# Patient Record
Sex: Female | Born: 1955 | Race: Black or African American | Hispanic: No | Smoking: Former smoker
Health system: Southern US, Community
[De-identification: ages and names within clinical notes are randomized; demographics above are authoritative.]

## PROBLEM LIST (undated history)

## (undated) DIAGNOSIS — E785 Hyperlipidemia, unspecified: Secondary | ICD-10-CM

## (undated) DIAGNOSIS — I10 Essential (primary) hypertension: Secondary | ICD-10-CM

## (undated) DIAGNOSIS — J45909 Unspecified asthma, uncomplicated: Secondary | ICD-10-CM

## (undated) DIAGNOSIS — I509 Heart failure, unspecified: Secondary | ICD-10-CM

## (undated) DIAGNOSIS — Z992 Dependence on renal dialysis: Secondary | ICD-10-CM

## (undated) DIAGNOSIS — N189 Chronic kidney disease, unspecified: Secondary | ICD-10-CM

## (undated) DIAGNOSIS — D649 Anemia, unspecified: Secondary | ICD-10-CM

## (undated) DIAGNOSIS — I96 Gangrene, not elsewhere classified: Secondary | ICD-10-CM

## (undated) DIAGNOSIS — G709 Myoneural disorder, unspecified: Secondary | ICD-10-CM

## (undated) DIAGNOSIS — E118 Type 2 diabetes mellitus with unspecified complications: Secondary | ICD-10-CM

## (undated) DIAGNOSIS — N186 End stage renal disease: Secondary | ICD-10-CM

## (undated) DIAGNOSIS — M1990 Unspecified osteoarthritis, unspecified site: Secondary | ICD-10-CM

## (undated) HISTORY — DX: Gangrene, not elsewhere classified: I96

## (undated) HISTORY — DX: Heart failure, unspecified: I50.9

## (undated) HISTORY — DX: Essential (primary) hypertension: I10

## (undated) HISTORY — PX: INSERTION OF DIALYSIS CATHETER: SHX1324

## (undated) HISTORY — DX: End stage renal disease: Z99.2

## (undated) HISTORY — DX: End stage renal disease: N18.6

## (undated) HISTORY — PX: TUBAL LIGATION: SHX77

## (undated) HISTORY — DX: Anemia, unspecified: D64.9

## (undated) HISTORY — DX: Type 2 diabetes mellitus with unspecified complications: E11.8

---

## 2014-06-22 ENCOUNTER — Encounter: Payer: Self-pay | Admitting: Vascular Surgery

## 2014-06-22 ENCOUNTER — Other Ambulatory Visit: Payer: Self-pay | Admitting: *Deleted

## 2014-06-22 DIAGNOSIS — Z01810 Encounter for preprocedural cardiovascular examination: Secondary | ICD-10-CM

## 2014-06-22 DIAGNOSIS — N179 Acute kidney failure, unspecified: Secondary | ICD-10-CM

## 2014-06-24 DIAGNOSIS — E669 Obesity, unspecified: Secondary | ICD-10-CM | POA: Insufficient documentation

## 2014-06-24 DIAGNOSIS — I10 Essential (primary) hypertension: Secondary | ICD-10-CM | POA: Insufficient documentation

## 2014-06-24 DIAGNOSIS — I509 Heart failure, unspecified: Secondary | ICD-10-CM | POA: Insufficient documentation

## 2014-06-28 ENCOUNTER — Ambulatory Visit: Payer: Self-pay | Admitting: Vascular Surgery

## 2014-06-28 ENCOUNTER — Other Ambulatory Visit (HOSPITAL_COMMUNITY): Payer: Self-pay

## 2014-06-28 ENCOUNTER — Encounter (HOSPITAL_COMMUNITY): Payer: Self-pay

## 2014-07-04 ENCOUNTER — Encounter: Payer: Self-pay | Admitting: Vascular Surgery

## 2014-07-05 ENCOUNTER — Other Ambulatory Visit (HOSPITAL_COMMUNITY): Payer: Self-pay

## 2014-07-05 ENCOUNTER — Ambulatory Visit: Payer: Self-pay | Admitting: Vascular Surgery

## 2014-07-05 ENCOUNTER — Inpatient Hospital Stay (HOSPITAL_COMMUNITY): Admission: RE | Admit: 2014-07-05 | Payer: Self-pay | Source: Ambulatory Visit

## 2014-07-20 ENCOUNTER — Encounter: Payer: Self-pay | Admitting: Vascular Surgery

## 2014-07-21 ENCOUNTER — Ambulatory Visit: Payer: Self-pay | Admitting: Vascular Surgery

## 2014-07-21 ENCOUNTER — Encounter (HOSPITAL_COMMUNITY): Payer: Self-pay

## 2014-07-21 ENCOUNTER — Other Ambulatory Visit (HOSPITAL_COMMUNITY): Payer: Self-pay

## 2014-07-25 ENCOUNTER — Encounter: Payer: Self-pay | Admitting: Vascular Surgery

## 2014-07-26 ENCOUNTER — Ambulatory Visit (INDEPENDENT_AMBULATORY_CARE_PROVIDER_SITE_OTHER): Payer: BLUE CROSS/BLUE SHIELD | Admitting: Vascular Surgery

## 2014-07-26 ENCOUNTER — Ambulatory Visit (HOSPITAL_COMMUNITY)
Admission: RE | Admit: 2014-07-26 | Discharge: 2014-07-26 | Disposition: A | Discharge status: Home / Self Care | Payer: BLUE CROSS/BLUE SHIELD | Source: Ambulatory Visit | Attending: Vascular Surgery | Admitting: Vascular Surgery

## 2014-07-26 ENCOUNTER — Encounter: Payer: Self-pay | Admitting: Vascular Surgery

## 2014-07-26 ENCOUNTER — Ambulatory Visit (INDEPENDENT_AMBULATORY_CARE_PROVIDER_SITE_OTHER)
Admission: RE | Admit: 2014-07-26 | Discharge: 2014-07-26 | Disposition: A | Discharge status: Home / Self Care | Payer: BLUE CROSS/BLUE SHIELD | Source: Ambulatory Visit | Attending: Vascular Surgery | Admitting: Vascular Surgery

## 2014-07-26 ENCOUNTER — Other Ambulatory Visit: Payer: Self-pay

## 2014-07-26 VITALS — BP 175/82 | HR 87 | Ht 64.0 in | Wt 241.0 lb

## 2014-07-26 DIAGNOSIS — Z01810 Encounter for preprocedural cardiovascular examination: Secondary | ICD-10-CM

## 2014-07-26 DIAGNOSIS — N179 Acute kidney failure, unspecified: Secondary | ICD-10-CM

## 2014-07-26 DIAGNOSIS — N186 End stage renal disease: Secondary | ICD-10-CM | POA: Insufficient documentation

## 2014-07-26 NOTE — Progress Notes (Signed)
Subjective:     Patient ID: Christy Zhang, female   DOB: [birth year], 59 y.o.   MRN: [id number]  HPI this 59 year old female is evaluated for vascular access. She is right handed. She has been on dialysis since February 2016. She currently has a tunneled hemodialysis catheter in the right IJ. She has never had an attempt of vascular access in the past.  Past Medical History  Diagnosis Date  . Hypertension   . Diabetes mellitus without complication   . Anemia   . CHF (congestive heart failure)     History  Substance Use Topics  . Smoking status: Former Smoker    Types: Cigarettes    Quit date: 07/25/1984  . Smokeless tobacco: Not on file  . Alcohol Use: No    Family History  Problem Relation Age of Onset  . Diabetes Mother   . Hypertension Mother   . Cancer Father   . Diabetes Father   . Hypertension Sister   . Diabetes Daughter   . Heart disease Daughter   . Hypertension Daughter   . Diabetes Son   . Heart disease Son   . Hypertension Son   . Peripheral vascular disease Son     amputation    No Known Allergies   Current outpatient prescriptions:  .  amLODipine (NORVASC) 10 MG tablet, Take 10 mg by mouth daily., Disp: , Rfl:  .  ergocalciferol (VITAMIN D2) 50000 UNITS capsule, Take 50,000 Units by mouth once a week., Disp: , Rfl:  .  insulin lispro protamine-lispro (HUMALOG 75/25 MIX) (75-25) 100 UNIT/ML SUSP injection, Inject into the skin., Disp: , Rfl:  .  metoprolol succinate (TOPROL-XL) 100 MG 24 hr tablet, Take 100 mg by mouth daily. Take with or immediately following a meal., Disp: , Rfl:   Filed Vitals:   07/26/14 1439 07/26/14 1446  BP: 172/80 175/82  Pulse: 87   Height: 5\' 4"  (1.626 m)   Weight: 241 lb (109.317 kg)   SpO2: 96%     Body mass index is 41.35 kg/(m^2).           Review of Systems denies chest pain   or significant dyspnea on exertion. Had significant lower extremity edema prior to beginning dialysis but that is much better.  Area of hypertension and diabetes mellitus type 1 for 20 years Objective:   Physical Exam BP 175/82 mmHg  Pulse 87  Ht 5\' 4"  (1.626 m)  Wt 241 lb (109.317 kg)  BMI 41.35 kg/m2  SpO2 96%  Gen.-alert and oriented x3 in no apparent distress-obese HEENT normal for age Lungs no rhonchi or wheezing Cardiovascular regular rhythm no murmurs carotid pulses 3+ palpable no bruits audible-tunneled dialysis catheter right upper chest area Abdomen soft nontender no palpable masses Musculoskeletal free of  major deformities Skin clear -no rashes Neurologic normal Lower extremities 3+ femoral and dorsalis pedis pulses palpable bilaterally with 1+ edema       Assessment:     Today I ordered upper extremity vein mapping and arterial study. The cephalic vein in the right upper arm is the best vein available from a size standpoint. Forearm veins are inadequate bilaterally. Left upper arm cephalic vein is marginal. Radial and ulnar arterial flow bilaterally is good    Plan:     End-stage renal disease needs vascular access Plan creation right brachial-cephalic AV fistula on Thursday, April 7 as outpatient. Patient will check to see if she can get transportation for this procedure.

## 2014-08-10 ENCOUNTER — Encounter (HOSPITAL_COMMUNITY): Payer: Self-pay | Admitting: Anesthesiology

## 2014-08-10 MED ORDER — SODIUM CHLORIDE 0.9 % IV SOLN: INTRAVENOUS | Status: DC

## 2014-08-10 MED ORDER — CHLORHEXIDINE GLUCONATE CLOTH 2 % EX PADS: 6.0000 | MEDICATED_PAD | Freq: Once | CUTANEOUS | Status: DC

## 2014-08-10 MED ORDER — CEFUROXIME SODIUM 1.5 G IJ SOLR: 1.5000 g | INTRAMUSCULAR | Status: DC

## 2014-08-10 NOTE — Progress Notes (Signed)
Pt denies SOB, chest pain, and being under the care of a cardiologist. Pt made aware to not take diabetic medications the morning of procedure. Pt made aware to stop taking  taking  otc vitamins and herbal medications. Do not take any NSAIDs ie: Ibuprofen, Advil, Naproxen and etc.Pt verbalized understanding of all pre-op instructions.

## 2014-08-10 NOTE — Anesthesia Preprocedure Evaluation (Deleted)
Anesthesia Evaluation  Patient identified by MRN, date of birth, ID band Patient awake    Reviewed: Allergy & Precautions, NPO status , Patient's Chart, lab work & pertinent test results, reviewed documented beta blocker date and time   Airway Mallampati: II  TM Distance: >3 FB Neck ROM: Full    Dental no notable dental hx.    Pulmonary former smoker,  breath sounds clear to auscultation  Pulmonary exam normal       Cardiovascular hypertension, Pt. on medications and Pt. on home beta blockers +CHF Rhythm:Regular Rate:Normal     Neuro/Psych negative neurological ROS  negative psych ROS   GI/Hepatic negative GI ROS, Neg liver ROS,   Endo/Other  negative endocrine ROSdiabetes, Type 2, Oral Hypoglycemic AgentsMorbid obesity  Renal/GU ESRFRenal disease  negative genitourinary   Musculoskeletal negative musculoskeletal ROS (+)   Abdominal   Peds negative pediatric ROS (+)  Hematology  (+) anemia ,   Anesthesia Other Findings   Reproductive/Obstetrics negative OB ROS                             Anesthesia Physical Anesthesia Plan  ASA: III  Anesthesia Plan: MAC   Post-op Pain Management:    Induction: Intravenous  Airway Management Planned: Simple Face Mask, Natural Airway and Nasal Cannula  Additional Equipment:   Intra-op Plan:   Post-operative Plan:   Informed Consent: I have reviewed the patients History and Physical, chart, labs and discussed the procedure including the risks, benefits and alternatives for the proposed anesthesia with the patient or authorized representative who has indicated his/her understanding and acceptance.   Dental advisory given  Plan Discussed with: CRNA  Anesthesia Plan Comments:         Anesthesia Quick Evaluation

## 2014-08-11 ENCOUNTER — Other Ambulatory Visit: Payer: Self-pay

## 2014-08-11 ENCOUNTER — Ambulatory Visit (HOSPITAL_COMMUNITY)
Admission: RE | Admit: 2014-08-11 | Payer: BLUE CROSS/BLUE SHIELD | Source: Ambulatory Visit | Admitting: Vascular Surgery

## 2014-08-11 ENCOUNTER — Encounter (HOSPITAL_COMMUNITY): Admission: RE | Payer: Self-pay | Source: Ambulatory Visit

## 2014-08-11 SURGERY — ARTERIOVENOUS (AV) FISTULA CREATION
Anesthesia: Monitor Anesthesia Care | Laterality: Right

## 2014-08-24 ENCOUNTER — Encounter (HOSPITAL_COMMUNITY): Payer: Self-pay | Admitting: *Deleted

## 2014-08-25 ENCOUNTER — Telehealth: Payer: Self-pay | Admitting: Vascular Surgery

## 2014-08-25 ENCOUNTER — Encounter (HOSPITAL_COMMUNITY): Payer: Self-pay | Admitting: Anesthesiology

## 2014-08-25 ENCOUNTER — Encounter (HOSPITAL_COMMUNITY)
Admission: RE | Disposition: A | Discharge status: Home / Self Care | Payer: Self-pay | Source: Ambulatory Visit | Attending: Vascular Surgery

## 2014-08-25 ENCOUNTER — Other Ambulatory Visit: Payer: Self-pay | Admitting: *Deleted

## 2014-08-25 ENCOUNTER — Ambulatory Visit (HOSPITAL_COMMUNITY): Payer: Medicaid Other

## 2014-08-25 ENCOUNTER — Ambulatory Visit (HOSPITAL_COMMUNITY): Payer: Medicaid Other | Admitting: Anesthesiology

## 2014-08-25 ENCOUNTER — Ambulatory Visit (HOSPITAL_COMMUNITY)
Admission: RE | Admit: 2014-08-25 | Discharge: 2014-08-25 | Disposition: A | Discharge status: Home / Self Care | Payer: Medicaid Other | Source: Ambulatory Visit | Attending: Vascular Surgery | Admitting: Vascular Surgery

## 2014-08-25 DIAGNOSIS — N186 End stage renal disease: Secondary | ICD-10-CM | POA: Diagnosis not present

## 2014-08-25 DIAGNOSIS — Z6841 Body Mass Index (BMI) 40.0 and over, adult: Secondary | ICD-10-CM | POA: Diagnosis not present

## 2014-08-25 DIAGNOSIS — J45909 Unspecified asthma, uncomplicated: Secondary | ICD-10-CM | POA: Diagnosis not present

## 2014-08-25 DIAGNOSIS — E119 Type 2 diabetes mellitus without complications: Secondary | ICD-10-CM | POA: Insufficient documentation

## 2014-08-25 DIAGNOSIS — Z87891 Personal history of nicotine dependence: Secondary | ICD-10-CM | POA: Diagnosis not present

## 2014-08-25 DIAGNOSIS — Z4931 Encounter for adequacy testing for hemodialysis: Secondary | ICD-10-CM

## 2014-08-25 DIAGNOSIS — E669 Obesity, unspecified: Secondary | ICD-10-CM | POA: Diagnosis not present

## 2014-08-25 DIAGNOSIS — I509 Heart failure, unspecified: Secondary | ICD-10-CM | POA: Insufficient documentation

## 2014-08-25 DIAGNOSIS — Z01811 Encounter for preprocedural respiratory examination: Secondary | ICD-10-CM

## 2014-08-25 DIAGNOSIS — I120 Hypertensive chronic kidney disease with stage 5 chronic kidney disease or end stage renal disease: Secondary | ICD-10-CM | POA: Diagnosis present

## 2014-08-25 HISTORY — DX: Unspecified osteoarthritis, unspecified site: M19.90

## 2014-08-25 HISTORY — PX: AV FISTULA PLACEMENT: SHX1204

## 2014-08-25 HISTORY — DX: Chronic kidney disease, unspecified: N18.9

## 2014-08-25 HISTORY — DX: Myoneural disorder, unspecified: G70.9

## 2014-08-25 HISTORY — DX: Unspecified asthma, uncomplicated: J45.909

## 2014-08-25 LAB — POCT I-STAT 4, (NA,K, GLUC, HGB,HCT)
Glucose, Bld: 280 mg/dL — ABNORMAL HIGH (ref 70–99)
HCT: 45 % (ref 36.0–46.0)
Hemoglobin: 15.3 g/dL — ABNORMAL HIGH (ref 12.0–15.0)
Potassium: 4.4 mmol/L (ref 3.5–5.1)
Sodium: 135 mmol/L (ref 135–145)

## 2014-08-25 LAB — GLUCOSE, CAPILLARY
Glucose-Capillary: 243 mg/dL — ABNORMAL HIGH (ref 70–99)
Glucose-Capillary: 268 mg/dL — ABNORMAL HIGH (ref 70–99)

## 2014-08-25 SURGERY — ARTERIOVENOUS (AV) FISTULA CREATION
Anesthesia: Monitor Anesthesia Care | Site: Arm Upper | Laterality: Right

## 2014-08-25 MED ORDER — FENTANYL CITRATE (PF) 250 MCG/5ML IJ SOLN
INTRAMUSCULAR | Status: AC
  Filled 2014-08-25: qty 5

## 2014-08-25 MED ORDER — SODIUM CHLORIDE 0.9 % IJ SOLN
INTRAMUSCULAR | Status: AC
  Filled 2014-08-25: qty 10

## 2014-08-25 MED ORDER — CEFAZOLIN SODIUM-DEXTROSE 2-3 GM-% IV SOLR
INTRAVENOUS | Status: DC | PRN
Start: 2014-08-25 — End: 2014-08-25
  Administered 2014-08-25: 2 g via INTRAVENOUS

## 2014-08-25 MED ORDER — NEOSTIGMINE METHYLSULFATE 10 MG/10ML IV SOLN
INTRAVENOUS | Status: AC
  Filled 2014-08-25: qty 1

## 2014-08-25 MED ORDER — PROPOFOL INFUSION 10 MG/ML OPTIME
INTRAVENOUS | Status: DC | PRN
Start: 2014-08-25 — End: 2014-08-25
  Administered 2014-08-25: 75 ug/kg/min via INTRAVENOUS

## 2014-08-25 MED ORDER — FENTANYL CITRATE (PF) 100 MCG/2ML IJ SOLN
INTRAMUSCULAR | Status: DC | PRN
  Administered 2014-08-25: 25 ug via INTRAVENOUS
  Administered 2014-08-25: 50 ug via INTRAVENOUS
  Administered 2014-08-25: 25 ug via INTRAVENOUS

## 2014-08-25 MED ORDER — ARTIFICIAL TEARS OP OINT
TOPICAL_OINTMENT | OPHTHALMIC | Status: AC
  Filled 2014-08-25: qty 3.5

## 2014-08-25 MED ORDER — PROPOFOL 10 MG/ML IV BOLUS
INTRAVENOUS | Status: AC
  Filled 2014-08-25: qty 20

## 2014-08-25 MED ORDER — HYDROMORPHONE HCL 1 MG/ML IJ SOLN: 0.2500 mg | INTRAMUSCULAR | Status: DC | PRN

## 2014-08-25 MED ORDER — LIDOCAINE-EPINEPHRINE (PF) 1 %-1:200000 IJ SOLN
INTRAMUSCULAR | Status: DC | PRN
  Administered 2014-08-25: 16 mL

## 2014-08-25 MED ORDER — SODIUM CHLORIDE 0.9 % IV SOLN
INTRAVENOUS | Status: DC | PRN
  Administered 2014-08-25: 07:00:00 via INTRAVENOUS

## 2014-08-25 MED ORDER — OXYCODONE-ACETAMINOPHEN 5-325 MG PO TABS: 1.0000 | ORAL_TABLET | Freq: Four times a day (QID) | ORAL | Status: DC | PRN

## 2014-08-25 MED ORDER — PHENYLEPHRINE 40 MCG/ML (10ML) SYRINGE FOR IV PUSH (FOR BLOOD PRESSURE SUPPORT)
PREFILLED_SYRINGE | INTRAVENOUS | Status: AC
  Filled 2014-08-25: qty 10

## 2014-08-25 MED ORDER — 0.9 % SODIUM CHLORIDE (POUR BTL) OPTIME
TOPICAL | Status: DC | PRN
  Administered 2014-08-25: 1000 mL

## 2014-08-25 MED ORDER — LIDOCAINE-EPINEPHRINE (PF) 1 %-1:200000 IJ SOLN
INTRAMUSCULAR | Status: AC
  Filled 2014-08-25: qty 10

## 2014-08-25 MED ORDER — EPHEDRINE SULFATE 50 MG/ML IJ SOLN
INTRAMUSCULAR | Status: AC
  Filled 2014-08-25: qty 1

## 2014-08-25 MED ORDER — ONDANSETRON HCL 4 MG/2ML IJ SOLN
INTRAMUSCULAR | Status: AC
  Filled 2014-08-25: qty 2

## 2014-08-25 MED ORDER — ROCURONIUM BROMIDE 50 MG/5ML IV SOLN
INTRAVENOUS | Status: AC
  Filled 2014-08-25: qty 1

## 2014-08-25 MED ORDER — SUCCINYLCHOLINE CHLORIDE 20 MG/ML IJ SOLN
INTRAMUSCULAR | Status: AC
  Filled 2014-08-25: qty 1

## 2014-08-25 MED ORDER — LIDOCAINE HCL (CARDIAC) 20 MG/ML IV SOLN
INTRAVENOUS | Status: AC
Start: 2014-08-25 — End: 2014-08-25
  Filled 2014-08-25: qty 5

## 2014-08-25 MED ORDER — SODIUM CHLORIDE 0.9 % IR SOLN
Status: DC | PRN
  Administered 2014-08-25: 500 mL

## 2014-08-25 MED ORDER — MIDAZOLAM HCL 2 MG/2ML IJ SOLN
INTRAMUSCULAR | Status: AC
  Filled 2014-08-25: qty 2

## 2014-08-25 MED ORDER — CEFAZOLIN SODIUM-DEXTROSE 2-3 GM-% IV SOLR
INTRAVENOUS | Status: AC
  Filled 2014-08-25: qty 50

## 2014-08-25 MED ORDER — MIDAZOLAM HCL 5 MG/5ML IJ SOLN
INTRAMUSCULAR | Status: DC | PRN
  Administered 2014-08-25 (×2): 1 mg via INTRAVENOUS

## 2014-08-25 SURGICAL SUPPLY — 32 items
ARMBAND PINK RESTRICT EXTREMIT (MISCELLANEOUS) ×2 IMPLANT
CANISTER SUCTION 2500CC (MISCELLANEOUS) ×2 IMPLANT
CLIP TI MEDIUM 6 (CLIP) ×2 IMPLANT
CLIP TI WIDE RED SMALL 6 (CLIP) ×4 IMPLANT
COVER PROBE W GEL 5X96 (DRAPES) ×2 IMPLANT
DRAIN PENROSE 1/4X12 LTX STRL (WOUND CARE) ×2 IMPLANT
ELECT REM PT RETURN 9FT ADLT (ELECTROSURGICAL) ×2
ELECTRODE REM PT RTRN 9FT ADLT (ELECTROSURGICAL) ×1 IMPLANT
GEL ULTRASOUND 20GR AQUASONIC (MISCELLANEOUS) IMPLANT
GLOVE BIO SURGEON STRL SZ7 (GLOVE) ×2 IMPLANT
GLOVE BIO SURGEON STRL SZ7.5 (GLOVE) ×2 IMPLANT
GLOVE BIOGEL PI IND STRL 7.5 (GLOVE) ×2 IMPLANT
GLOVE BIOGEL PI INDICATOR 7.5 (GLOVE) ×2
GLOVE ECLIPSE 7.0 STRL STRAW (GLOVE) ×2 IMPLANT
GLOVE SS BIOGEL STRL SZ 7 (GLOVE) ×1 IMPLANT
GLOVE SUPERSENSE BIOGEL SZ 7 (GLOVE) ×1
GOWN STRL REUS W/ TWL LRG LVL3 (GOWN DISPOSABLE) ×3 IMPLANT
GOWN STRL REUS W/ TWL XL LVL3 (GOWN DISPOSABLE) ×1 IMPLANT
GOWN STRL REUS W/TWL LRG LVL3 (GOWN DISPOSABLE) ×3
GOWN STRL REUS W/TWL XL LVL3 (GOWN DISPOSABLE) ×1
KIT BASIN OR (CUSTOM PROCEDURE TRAY) ×2 IMPLANT
KIT ROOM TURNOVER OR (KITS) ×2 IMPLANT
LIQUID BAND (GAUZE/BANDAGES/DRESSINGS) ×2 IMPLANT
NS IRRIG 1000ML POUR BTL (IV SOLUTION) ×2 IMPLANT
PACK CV ACCESS (CUSTOM PROCEDURE TRAY) ×2 IMPLANT
PAD ARMBOARD 7.5X6 YLW CONV (MISCELLANEOUS) ×4 IMPLANT
SLEEVE SURGEON STRL (DRAPES) ×2 IMPLANT
SUT PROLENE 6 0 BV (SUTURE) ×4 IMPLANT
SUT VIC AB 3-0 SH 27 (SUTURE) ×1
SUT VIC AB 3-0 SH 27X BRD (SUTURE) ×1 IMPLANT
UNDERPAD 30X30 INCONTINENT (UNDERPADS AND DIAPERS) ×2 IMPLANT
WATER STERILE IRR 1000ML POUR (IV SOLUTION) ×2 IMPLANT

## 2014-08-25 NOTE — Anesthesia Procedure Notes (Signed)
Procedure Name: MAC Date/Time: 08/25/2014 7:40 AM Performed by: Susa Loffler Pre-anesthesia Checklist: Patient identified, Emergency Drugs available, Suction available, Patient being monitored and Timeout performed Patient Re-evaluated:Patient Re-evaluated prior to inductionOxygen Delivery Method: Simple face mask Dental Injury: Teeth and Oropharynx as per pre-operative assessment

## 2014-08-25 NOTE — Telephone Encounter (Signed)
-----   Message from Mena Goes, RN sent at 08/25/2014  9:14 AM EDT ----- Regarding: Schedule   ----- Message -----    From: Ulyses Amor, PA-C    Sent: 08/25/2014   8:41 AM      To: [facility] Charge Pool  S/P left AV fistula creation needs f/u with fistula duplex in 4-5 weeks Kellie Simmering

## 2014-08-25 NOTE — Op Note (Signed)
OPERATIVE REPORT  Date of Surgery: 08/25/2014  Surgeon: Tinnie Gens, MD  Assistant: Gerri Lins PA  Pre-op Diagnosis: End Stage Renal Disease N18.6  Post-op Diagnosis: End Stage Renal Disease N18.6  Procedure: Procedure(s): RIGHT BRACHIOCEPHALIC ARTERIOVENOUS (AV) FISTULA CREATION  Anesthesia: Mac  EBL: Minimal  Complications: None  Procedure Details: The patient was taken to the operating room placed in the supine position at which time the right upper extremity was prepped with Betadine scrub and solution draped in routine sterile manner. After infiltration with 1% Xylocaine with epinephrine transverse incision was made in the antecubital area carried down through subcutaneous tissue. Antecubital vein was dissected free cephalic branch mobilized it was ligated with 2-0 silk tie where it joined with the basilic branch which was preserved. The branches of the cephalic vein were ligated with 3-0 silk ties and divided. Brachial artery was exposed beneath the fascia encircled with Vesseloops. It was a mildly diseased vessel slightly smaller than normal for the brachial level but was widely patent with an excellent pulse. The cephalic vein was about [id number] mm in size. Artery was occluded proximally and distally opened with 15 blade extended with Potts scissors was good antegrade and retrograde bleeding. Vein was carefully measured spatulated and anastomosed end to side with 6-0 Prolene. Vesseloops then released and was good pulse and palpable thrill in the fistula. It was then imaged with the sono site ultrasound and there were no significant competing branches although the vein did get fairly deep in the upper arm. There was significant diminution of flow in the radial artery distally with the fistula open although the patient denied any symptoms of pain or numbness in the right hand. Following adequate hemostasis wound was closed in layers with Vicryl a subcuticular fashion with Dermabond  patient taken recovery room in satisfactory condition   Tinnie Gens, MD 08/25/2014 8:47 AM

## 2014-08-25 NOTE — Telephone Encounter (Signed)
No answer, no vm- mailed letter regarding follow up appointments, dpm

## 2014-08-25 NOTE — Interval H&P Note (Signed)
History and Physical Interval Note:  08/25/2014 7:27 AM  Christy Zhang  has presented today for surgery, with the diagnosis of End Stage Renal Disease N18.6  The various methods of treatment have been discussed with the patient and family. After consideration of risks, benefits and other options for treatment, the patient has consented to  Procedure(s): BRACHIOCEPHALIC ARTERIOVENOUS (AV) FISTULA CREATION (Right) as a surgical intervention .  The patient's history has been reviewed, patient examined, no change in status, stable for surgery.  I have reviewed the patient's chart and labs.  Questions were answered to the patient's satisfaction.     Tinnie Gens

## 2014-08-25 NOTE — Progress Notes (Signed)
Maureen PA/Vasc aware cbg 268 and pt has not taken insulin this am, no orders. Pt will take insulin on arrival home/eating

## 2014-08-25 NOTE — Anesthesia Postprocedure Evaluation (Signed)
  Anesthesia Post-op Note  Patient: Christy Zhang  Procedure(s) Performed: Procedure(s): RIGHT BRACHIOCEPHALIC ARTERIOVENOUS (AV) FISTULA CREATION (Right)  Patient Location: PACU  Anesthesia Type:MAC  Level of Consciousness: awake  Airway and Oxygen Therapy: Patient Spontanous Breathing  Post-op Pain: mild  Post-op Assessment: Post-op Vital signs reviewed  Post-op Vital Signs: Reviewed  Last Vitals:  Filed Vitals:   08/25/14 0927  BP: 159/66  Pulse: 73  Temp:   Resp: 18    Complications: No apparent anesthesia complications

## 2014-08-25 NOTE — H&P (View-Only) (Signed)
Subjective:     Patient ID: Christy Zhang, female   DOB: 29-Sep-1955, 59 y.o.   MRN: [id number]  HPI this 59 year old female is evaluated for vascular access. She is right handed. She has been on dialysis since February 2016. She currently has a tunneled hemodialysis catheter in the right IJ. She has never had an attempt of vascular access in the past.  Past Medical History  Diagnosis Date  . Hypertension   . Diabetes mellitus without complication   . Anemia   . CHF (congestive heart failure)     History  Substance Use Topics  . Smoking status: Former Smoker    Types: Cigarettes    Quit date: 07/25/1984  . Smokeless tobacco: Not on file  . Alcohol Use: No    Family History  Problem Relation Age of Onset  . Diabetes Mother   . Hypertension Mother   . Cancer Father   . Diabetes Father   . Hypertension Sister   . Diabetes Daughter   . Heart disease Daughter   . Hypertension Daughter   . Diabetes Son   . Heart disease Son   . Hypertension Son   . Peripheral vascular disease Son     amputation    No Known Allergies   Current outpatient prescriptions:  .  amLODipine (NORVASC) 10 MG tablet, Take 10 mg by mouth daily., Disp: , Rfl:  .  ergocalciferol (VITAMIN D2) 50000 UNITS capsule, Take 50,000 Units by mouth once a week., Disp: , Rfl:  .  insulin lispro protamine-lispro (HUMALOG 75/25 MIX) (75-25) 100 UNIT/ML SUSP injection, Inject into the skin., Disp: , Rfl:  .  metoprolol succinate (TOPROL-XL) 100 MG 24 hr tablet, Take 100 mg by mouth daily. Take with or immediately following a meal., Disp: , Rfl:   Filed Vitals:   07/26/14 1439 07/26/14 1446  BP: 172/80 175/82  Pulse: 87   Height: 5\' 4"  (1.626 m)   Weight: 241 lb (109.317 kg)   SpO2: 96%     Body mass index is 41.35 kg/(m^2).           Review of Systems denies chest pain   or significant dyspnea on exertion. Had significant lower extremity edema prior to beginning dialysis but that is much better.  Area of hypertension and diabetes mellitus type 1 for 20 years Objective:   Physical Exam BP 175/82 mmHg  Pulse 87  Ht 5\' 4"  (1.626 m)  Wt 241 lb (109.317 kg)  BMI 41.35 kg/m2  SpO2 96%  Gen.-alert and oriented x3 in no apparent distress-obese HEENT normal for age Lungs no rhonchi or wheezing Cardiovascular regular rhythm no murmurs carotid pulses 3+ palpable no bruits audible-tunneled dialysis catheter right upper chest area Abdomen soft nontender no palpable masses Musculoskeletal free of  major deformities Skin clear -no rashes Neurologic normal Lower extremities 3+ femoral and dorsalis pedis pulses palpable bilaterally with 1+ edema       Assessment:     Today I ordered upper extremity vein mapping and arterial study. The cephalic vein in the right upper arm is the best vein available from a size standpoint. Forearm veins are inadequate bilaterally. Left upper arm cephalic vein is marginal. Radial and ulnar arterial flow bilaterally is good    Plan:     End-stage renal disease needs vascular access Plan creation right brachial-cephalic AV fistula on Thursday, April 7 as outpatient. Patient will check to see if she can get transportation for this procedure.

## 2014-08-25 NOTE — Anesthesia Preprocedure Evaluation (Addendum)
Anesthesia Evaluation  Patient identified by MRN, date of birth, ID band Patient awake    Reviewed: Allergy & Precautions, NPO status   Airway Mallampati: II  TM Distance: >3 FB Neck ROM: Full    Dental  (+) Poor Dentition,    Pulmonary asthma , former smoker,  breath sounds clear to auscultation        Cardiovascular hypertension, Pt. on medications and Pt. on home beta blockers +CHF Rhythm:Regular Rate:Normal     Neuro/Psych    GI/Hepatic negative GI ROS, Neg liver ROS,   Endo/Other  diabetes, Type 2Morbid obesity  Renal/GU ESRFRenal diseaseHD 08/24/14  K 4.4     Musculoskeletal  (+) Arthritis -,   Abdominal   Peds  Hematology  (+) anemia ,   Anesthesia Other Findings   Reproductive/Obstetrics                            Anesthesia Physical Anesthesia Plan  ASA: IV  Anesthesia Plan: MAC   Post-op Pain Management:    Induction: Intravenous  Airway Management Planned: Simple Face Mask  Additional Equipment:   Intra-op Plan:   Post-operative Plan:   Informed Consent: I have reviewed the patients History and Physical, chart, labs and discussed the procedure including the risks, benefits and alternatives for the proposed anesthesia with the patient or authorized representative who has indicated his/her understanding and acceptance.   Dental advisory given  Plan Discussed with: Anesthesiologist and CRNA  Anesthesia Plan Comments:         Anesthesia Quick Evaluation

## 2014-08-25 NOTE — Transfer of Care (Signed)
Immediate Anesthesia Transfer of Care Note  Patient: Christy Zhang  Procedure(s) Performed: Procedure(s): RIGHT BRACHIOCEPHALIC ARTERIOVENOUS (AV) FISTULA CREATION (Right)  Patient Location: PACU  Anesthesia Type:MAC  Level of Consciousness: awake, alert  and oriented  Airway & Oxygen Therapy: Patient Spontanous Breathing  Post-op Assessment: Report given to RN and Post -op Vital signs reviewed and stable  Post vital signs: Reviewed and stable  Last Vitals:  Filed Vitals:   08/25/14 0640  BP: 170/71  Pulse: 65  Temp: 36.7 C  Resp: 18    Complications: No apparent anesthesia complications

## 2014-08-26 ENCOUNTER — Encounter (HOSPITAL_COMMUNITY): Payer: Self-pay | Admitting: Vascular Surgery

## 2014-09-22 ENCOUNTER — Ambulatory Visit (HOSPITAL_COMMUNITY)
Admission: RE | Admit: 2014-09-22 | Discharge: 2014-09-22 | Disposition: A | Discharge status: Home / Self Care | Payer: Medicaid Other | Source: Ambulatory Visit | Attending: Vascular Surgery | Admitting: Vascular Surgery

## 2014-09-22 DIAGNOSIS — Z4931 Encounter for adequacy testing for hemodialysis: Secondary | ICD-10-CM | POA: Diagnosis not present

## 2014-09-22 DIAGNOSIS — N186 End stage renal disease: Secondary | ICD-10-CM | POA: Insufficient documentation

## 2014-09-23 ENCOUNTER — Encounter: Payer: Self-pay | Admitting: Vascular Surgery

## 2014-09-27 ENCOUNTER — Encounter: Payer: Self-pay | Admitting: Vascular Surgery

## 2014-10-14 ENCOUNTER — Encounter: Payer: Self-pay | Admitting: Vascular Surgery

## 2014-10-18 ENCOUNTER — Encounter: Payer: Self-pay | Admitting: Vascular Surgery

## 2014-10-18 ENCOUNTER — Other Ambulatory Visit: Payer: Self-pay

## 2014-10-18 ENCOUNTER — Ambulatory Visit (INDEPENDENT_AMBULATORY_CARE_PROVIDER_SITE_OTHER): Payer: Self-pay | Admitting: Vascular Surgery

## 2014-10-18 VITALS — BP 182/64 | HR 84 | Temp 98.2°F | Resp 20 | Ht 64.0 in | Wt 250.0 lb

## 2014-10-18 DIAGNOSIS — N186 End stage renal disease: Secondary | ICD-10-CM

## 2014-10-18 NOTE — Progress Notes (Signed)
Subjective:     Patient ID: Christy Zhang, female   DOB: 05-24-1955, 59 y.o.   MRN: [id number]  HPI this 59 year old female with end-stage renal disease has hemodialysis Monday Wednesday and Friday in [place]. She had a right brachial cephalic AV fistula created by me on 08/25/2014. She is currently being dialyzed through a right IJ dialysis catheter which was inserted by Dr. Bary Leriche. Patient is evaluated for her first postoperative visit today. She denies any pain or numbness in the right hand.  Past Medical History  Diagnosis Date  . Hypertension   . Anemia   . CHF (congestive heart failure)   . Chronic kidney disease     Renal failure, dialysis on M/W/F  . Asthma     in the past  . Diabetes mellitus without complication     Type 2  . Arthritis   . Neuromuscular disorder     neuropathy    History  Substance Use Topics  . Smoking status: Former Smoker    Types: Cigarettes    Quit date: 07/25/1984  . Smokeless tobacco: Never Used  . Alcohol Use: No    Family History  Problem Relation Age of Onset  . Diabetes Mother   . Hypertension Mother   . Cancer Father   . Diabetes Father   . Hypertension Sister   . Diabetes Daughter   . Heart disease Daughter   . Hypertension Daughter   . Diabetes Son   . Heart disease Son   . Hypertension Son   . Peripheral vascular disease Son     amputation    No Known Allergies   Current outpatient prescriptions:  .  acetaminophen (TYLENOL) 500 MG tablet, Take 500 mg by mouth every 8 (eight) hours as needed for mild pain., Disp: , Rfl:  .  amLODipine (NORVASC) 10 MG tablet, Take 10 mg by mouth daily., Disp: , Rfl:  .  azithromycin (ZITHROMAX) 250 MG tablet, Take 250 mg by mouth daily., Disp: , Rfl:  .  diphenhydrAMINE (BENADRYL) 25 mg capsule, Take 25 mg by mouth every 8 (eight) hours as needed for itching., Disp: , Rfl:  .  ergocalciferol (VITAMIN D2) 50000 UNITS capsule, Take 50,000 Units by mouth once a week., Disp: , Rfl:  .   guaifenesin (HUMIBID E) 400 MG TABS tablet, Take 400 mg by mouth 3 (three) times daily., Disp: , Rfl:  .  insulin lispro protamine-lispro (HUMALOG 75/25 MIX) (75-25) 100 UNIT/ML SUSP injection, Inject 30 Units into the skin 2 (two) times daily with a meal. , Disp: , Rfl:  .  metoprolol succinate (TOPROL-XL) 100 MG 24 hr tablet, Take 100 mg by mouth daily. Take with or immediately following a meal., Disp: , Rfl:  .  oxyCODONE-acetaminophen (PERCOCET/ROXICET) 5-325 MG per tablet, Take 1 tablet by mouth every 6 (six) hours as needed., Disp: 30 tablet, Rfl: 0 .  OXYGEN, Inhale 2 L into the lungs at bedtime. As needed, Disp: , Rfl:   Filed Vitals:   10/18/14 0842  BP: 182/64  Pulse: 84  Temp: 98.2 F (36.8 C)  TempSrc: Oral  Resp: 20  Height: 5\' 4"  (1.626 m)  Weight: 250 lb (113.399 kg)  SpO2: 100%    Body mass index is 42.89 kg/(m^2).           Review of Systems denies chest pain, dyspnea on exertion, PND, orthopnea, hemoptysis     Objective:   Physical Exam BP 182/64 mmHg  Pulse 84  Temp(Src) 98.2 F (36.8  C) (Oral)  Resp 20  Ht 5\' 4"  (1.626 m)  Wt 250 lb (113.399 kg)  BMI 42.89 kg/m2  SpO2 100%  Gen. well-developed well-nourished obese female no apparent distress alert and oriented 3 Lungs no rhonchi or wheezing Cardiovascular regular rhythm no murmurs Abdomen obese Right upper extremity with well-healed antecubital wound. Good pulse and palpable thrill and AV fistula-brachial-cephalic.  I reviewed the fistula duplex exam which was performed 09/22/2014. This reveals the fistula be widely patent with one branch in the mid upper arm. It is relatively deep in location.     Assessment:     Patient with end-stage renal disease with right brachial-cephalic AV fistula which is somewhat deep in location and 1 competing branch and mid upper arm    Plan:     We'll schedule superficialization of fistula and ligation of competing branch for Thursday, June 23. Access will  be ready to use 11/24/2014

## 2014-10-19 ENCOUNTER — Encounter (HOSPITAL_COMMUNITY): Payer: Self-pay | Admitting: *Deleted

## 2014-10-19 MED ORDER — DEXTROSE 5 % IV SOLN: 1.5000 g | INTRAVENOUS | Status: AC

## 2014-10-19 MED ORDER — SODIUM CHLORIDE 0.9 % IV SOLN: INTRAVENOUS | Status: DC

## 2014-10-19 NOTE — Progress Notes (Signed)
Pt denies SOB, chest pain, and being under the care of a cardiologist. Pt made aware to not take diabetic medications the morning of procedure. Pt made aware to stop taking taking otc vitamins and herbal medications. Do not take any NSAIDs ie: Ibuprofen, Advil, Naproxen and etc.Pt verbalized understanding of all pre-op instructions.

## 2014-10-20 ENCOUNTER — Ambulatory Visit (HOSPITAL_COMMUNITY): Admission: RE | Admit: 2014-10-20 | Payer: Self-pay | Source: Ambulatory Visit | Admitting: Vascular Surgery

## 2014-10-20 SURGERY — FISTULA SUPERFICIALIZATION
Anesthesia: Monitor Anesthesia Care | Laterality: Right

## 2014-10-25 ENCOUNTER — Other Ambulatory Visit: Payer: Self-pay

## 2014-11-02 ENCOUNTER — Encounter (HOSPITAL_COMMUNITY): Payer: Self-pay | Admitting: *Deleted

## 2014-11-03 ENCOUNTER — Encounter (HOSPITAL_COMMUNITY): Payer: Self-pay | Admitting: *Deleted

## 2014-11-03 ENCOUNTER — Ambulatory Visit (HOSPITAL_COMMUNITY)
Admission: RE | Admit: 2014-11-03 | Discharge: 2014-11-03 | Disposition: A | Discharge status: Home / Self Care | Payer: Medicaid Other | Source: Ambulatory Visit | Attending: Vascular Surgery | Admitting: Vascular Surgery

## 2014-11-03 ENCOUNTER — Ambulatory Visit (HOSPITAL_COMMUNITY): Payer: Medicaid Other | Admitting: Anesthesiology

## 2014-11-03 ENCOUNTER — Encounter (HOSPITAL_COMMUNITY)
Admission: RE | Disposition: A | Discharge status: Home / Self Care | Payer: Self-pay | Source: Ambulatory Visit | Attending: Vascular Surgery

## 2014-11-03 DIAGNOSIS — E1122 Type 2 diabetes mellitus with diabetic chronic kidney disease: Secondary | ICD-10-CM | POA: Insufficient documentation

## 2014-11-03 DIAGNOSIS — Z794 Long term (current) use of insulin: Secondary | ICD-10-CM | POA: Diagnosis not present

## 2014-11-03 DIAGNOSIS — I509 Heart failure, unspecified: Secondary | ICD-10-CM | POA: Insufficient documentation

## 2014-11-03 DIAGNOSIS — Z6841 Body Mass Index (BMI) 40.0 and over, adult: Secondary | ICD-10-CM | POA: Insufficient documentation

## 2014-11-03 DIAGNOSIS — I120 Hypertensive chronic kidney disease with stage 5 chronic kidney disease or end stage renal disease: Secondary | ICD-10-CM | POA: Diagnosis not present

## 2014-11-03 DIAGNOSIS — T82898A Other specified complication of vascular prosthetic devices, implants and grafts, initial encounter: Secondary | ICD-10-CM | POA: Insufficient documentation

## 2014-11-03 DIAGNOSIS — N185 Chronic kidney disease, stage 5: Secondary | ICD-10-CM

## 2014-11-03 DIAGNOSIS — Z79899 Other long term (current) drug therapy: Secondary | ICD-10-CM | POA: Diagnosis not present

## 2014-11-03 DIAGNOSIS — E1140 Type 2 diabetes mellitus with diabetic neuropathy, unspecified: Secondary | ICD-10-CM | POA: Insufficient documentation

## 2014-11-03 DIAGNOSIS — Y832 Surgical operation with anastomosis, bypass or graft as the cause of abnormal reaction of the patient, or of later complication, without mention of misadventure at the time of the procedure: Secondary | ICD-10-CM | POA: Diagnosis not present

## 2014-11-03 DIAGNOSIS — Z87891 Personal history of nicotine dependence: Secondary | ICD-10-CM | POA: Insufficient documentation

## 2014-11-03 DIAGNOSIS — N186 End stage renal disease: Secondary | ICD-10-CM | POA: Insufficient documentation

## 2014-11-03 DIAGNOSIS — Z992 Dependence on renal dialysis: Secondary | ICD-10-CM | POA: Diagnosis not present

## 2014-11-03 DIAGNOSIS — D649 Anemia, unspecified: Secondary | ICD-10-CM | POA: Insufficient documentation

## 2014-11-03 HISTORY — PX: LIGATION OF COMPETING BRANCHES OF ARTERIOVENOUS FISTULA: SHX5949

## 2014-11-03 HISTORY — PX: FISTULA SUPERFICIALIZATION: SHX6341

## 2014-11-03 LAB — POCT I-STAT 4, (NA,K, GLUC, HGB,HCT)
Glucose, Bld: 214 mg/dL — ABNORMAL HIGH (ref 65–99)
HCT: 39 % (ref 36.0–46.0)
Hemoglobin: 13.3 g/dL (ref 12.0–15.0)
Potassium: 5.1 mmol/L (ref 3.5–5.1)
Sodium: 134 mmol/L — ABNORMAL LOW (ref 135–145)

## 2014-11-03 LAB — GLUCOSE, CAPILLARY: Glucose-Capillary: 191 mg/dL — ABNORMAL HIGH (ref 65–99)

## 2014-11-03 SURGERY — FISTULA SUPERFICIALIZATION
Anesthesia: Monitor Anesthesia Care | Site: Arm Upper | Laterality: Right

## 2014-11-03 MED ORDER — MEPERIDINE HCL 25 MG/ML IJ SOLN: 6.2500 mg | INTRAMUSCULAR | Status: DC | PRN

## 2014-11-03 MED ORDER — EPHEDRINE SULFATE 50 MG/ML IJ SOLN
INTRAMUSCULAR | Status: AC
  Filled 2014-11-03: qty 1

## 2014-11-03 MED ORDER — MIDAZOLAM HCL 2 MG/2ML IJ SOLN
INTRAMUSCULAR | Status: DC | PRN
  Administered 2014-11-03: 2 mg via INTRAVENOUS

## 2014-11-03 MED ORDER — HYDROMORPHONE HCL 1 MG/ML IJ SOLN
0.5000 mg | INTRAMUSCULAR | Status: DC | PRN
  Administered 2014-11-03 (×2): 0.5 mg via INTRAVENOUS

## 2014-11-03 MED ORDER — HYDROMORPHONE HCL 1 MG/ML IJ SOLN
INTRAMUSCULAR | Status: DC
Start: 2014-11-03 — End: 2014-11-03
  Filled 2014-11-03: qty 1

## 2014-11-03 MED ORDER — PROMETHAZINE HCL 25 MG/ML IJ SOLN: 6.2500 mg | INTRAMUSCULAR | Status: DC | PRN

## 2014-11-03 MED ORDER — PROPOFOL 10 MG/ML IV BOLUS
INTRAVENOUS | Status: AC
  Filled 2014-11-03: qty 20

## 2014-11-03 MED ORDER — STERILE WATER FOR INJECTION IJ SOLN
INTRAMUSCULAR | Status: AC
  Filled 2014-11-03: qty 10

## 2014-11-03 MED ORDER — ONDANSETRON HCL 4 MG/2ML IJ SOLN
INTRAMUSCULAR | Status: DC | PRN
Start: 2014-11-03 — End: 2014-11-03
  Administered 2014-11-03: 4 mg via INTRAVENOUS

## 2014-11-03 MED ORDER — 0.9 % SODIUM CHLORIDE (POUR BTL) OPTIME
TOPICAL | Status: DC | PRN
  Administered 2014-11-03: 1000 mL

## 2014-11-03 MED ORDER — SODIUM CHLORIDE 0.9 % IR SOLN
Status: DC | PRN
  Administered 2014-11-03: 11:00:00

## 2014-11-03 MED ORDER — CHLORHEXIDINE GLUCONATE CLOTH 2 % EX PADS: 6.0000 | MEDICATED_PAD | Freq: Once | CUTANEOUS | Status: DC

## 2014-11-03 MED ORDER — OXYCODONE HCL 5 MG PO TABS: 5.0000 mg | ORAL_TABLET | Freq: Once | ORAL | Status: DC | PRN

## 2014-11-03 MED ORDER — MIDAZOLAM HCL 2 MG/2ML IJ SOLN
INTRAMUSCULAR | Status: AC
  Filled 2014-11-03: qty 2

## 2014-11-03 MED ORDER — EPHEDRINE SULFATE 50 MG/ML IJ SOLN
INTRAMUSCULAR | Status: DC | PRN
  Administered 2014-11-03: 5 mg via INTRAVENOUS

## 2014-11-03 MED ORDER — PHENYLEPHRINE HCL 10 MG/ML IJ SOLN
INTRAMUSCULAR | Status: DC | PRN
  Administered 2014-11-03: 80 ug via INTRAVENOUS
  Administered 2014-11-03: 40 ug via INTRAVENOUS

## 2014-11-03 MED ORDER — OXYCODONE-ACETAMINOPHEN 5-325 MG PO TABS: 1.0000 | ORAL_TABLET | Freq: Four times a day (QID) | ORAL | Status: DC | PRN

## 2014-11-03 MED ORDER — LIDOCAINE HCL (CARDIAC) 20 MG/ML IV SOLN
INTRAVENOUS | Status: DC | PRN
  Administered 2014-11-03: 100 mg via INTRAVENOUS

## 2014-11-03 MED ORDER — PROPOFOL 10 MG/ML IV BOLUS
INTRAVENOUS | Status: DC | PRN
  Administered 2014-11-03 (×2): 50 mg via INTRAVENOUS

## 2014-11-03 MED ORDER — DEXTROSE 5 % IV SOLN
1.5000 g | INTRAVENOUS | Status: AC
  Administered 2014-11-03: 1.5 g via INTRAVENOUS
  Filled 2014-11-03: qty 1.5

## 2014-11-03 MED ORDER — OXYCODONE HCL 5 MG/5ML PO SOLN: 5.0000 mg | Freq: Once | ORAL | Status: DC | PRN

## 2014-11-03 MED ORDER — FENTANYL CITRATE (PF) 250 MCG/5ML IJ SOLN
INTRAMUSCULAR | Status: AC
  Filled 2014-11-03: qty 5

## 2014-11-03 MED ORDER — ONDANSETRON HCL 4 MG/2ML IJ SOLN
INTRAMUSCULAR | Status: AC
  Filled 2014-11-03: qty 2

## 2014-11-03 MED ORDER — PHENYLEPHRINE 40 MCG/ML (10ML) SYRINGE FOR IV PUSH (FOR BLOOD PRESSURE SUPPORT)
PREFILLED_SYRINGE | INTRAVENOUS | Status: AC
  Filled 2014-11-03: qty 10

## 2014-11-03 MED ORDER — SODIUM CHLORIDE 0.9 % IV SOLN
INTRAVENOUS | Status: DC | PRN
  Administered 2014-11-03 (×2): via INTRAVENOUS

## 2014-11-03 MED ORDER — FENTANYL CITRATE (PF) 100 MCG/2ML IJ SOLN
INTRAMUSCULAR | Status: DC | PRN
  Administered 2014-11-03: 25 ug via INTRAVENOUS

## 2014-11-03 SURGICAL SUPPLY — 38 items
CANISTER SUCTION 2500CC (MISCELLANEOUS) ×2 IMPLANT
CLIP TI MEDIUM 6 (CLIP) ×4 IMPLANT
CLIP TI WIDE RED SMALL 6 (CLIP) ×2 IMPLANT
COVER PROBE W GEL 5X96 (DRAPES) IMPLANT
ELECT REM PT RETURN 9FT ADLT (ELECTROSURGICAL) ×2
ELECTRODE REM PT RTRN 9FT ADLT (ELECTROSURGICAL) ×1 IMPLANT
GAUZE SPONGE 4X4 12PLY STRL (GAUZE/BANDAGES/DRESSINGS) ×2 IMPLANT
GEL ULTRASOUND 20GR AQUASONIC (MISCELLANEOUS) IMPLANT
GLOVE BIO SURGEON STRL SZ 6.5 (GLOVE) ×4 IMPLANT
GLOVE BIO SURGEON STRL SZ7.5 (GLOVE) ×2 IMPLANT
GLOVE BIOGEL PI IND STRL 7.0 (GLOVE) ×2 IMPLANT
GLOVE BIOGEL PI IND STRL 7.5 (GLOVE) ×1 IMPLANT
GLOVE BIOGEL PI IND STRL 8 (GLOVE) ×2 IMPLANT
GLOVE BIOGEL PI INDICATOR 7.0 (GLOVE) ×2
GLOVE BIOGEL PI INDICATOR 7.5 (GLOVE) ×1
GLOVE BIOGEL PI INDICATOR 8 (GLOVE) ×2
GLOVE ECLIPSE 6.5 STRL STRAW (GLOVE) ×4 IMPLANT
GLOVE SS BIOGEL STRL SZ 7 (GLOVE) ×1 IMPLANT
GLOVE SUPERSENSE BIOGEL SZ 7 (GLOVE) ×1
GLOVE SURG SS PI 7.0 STRL IVOR (GLOVE) ×2 IMPLANT
GOWN STRL REUS W/ TWL LRG LVL3 (GOWN DISPOSABLE) ×5 IMPLANT
GOWN STRL REUS W/ TWL XL LVL3 (GOWN DISPOSABLE) ×1 IMPLANT
GOWN STRL REUS W/TWL LRG LVL3 (GOWN DISPOSABLE) ×5
GOWN STRL REUS W/TWL XL LVL3 (GOWN DISPOSABLE) ×1
KIT BASIN OR (CUSTOM PROCEDURE TRAY) ×2 IMPLANT
KIT ROOM TURNOVER OR (KITS) ×2 IMPLANT
LIQUID BAND (GAUZE/BANDAGES/DRESSINGS) ×2 IMPLANT
NS IRRIG 1000ML POUR BTL (IV SOLUTION) ×2 IMPLANT
PACK CV ACCESS (CUSTOM PROCEDURE TRAY) ×2 IMPLANT
PAD ARMBOARD 7.5X6 YLW CONV (MISCELLANEOUS) ×4 IMPLANT
SUT PROLENE 6 0 BV (SUTURE) ×2 IMPLANT
SUT PROLENE 7 0 BV 1 (SUTURE) ×2 IMPLANT
SUT VIC AB 3-0 SH 18 (SUTURE) ×6 IMPLANT
SUT VIC AB 3-0 SH 27 (SUTURE) ×1
SUT VIC AB 3-0 SH 27X BRD (SUTURE) ×1 IMPLANT
SUT VICRYL 4-0 PS2 18IN ABS (SUTURE) ×2 IMPLANT
UNDERPAD 30X30 INCONTINENT (UNDERPADS AND DIAPERS) ×2 IMPLANT
WATER STERILE IRR 1000ML POUR (IV SOLUTION) ×2 IMPLANT

## 2014-11-03 NOTE — Transfer of Care (Signed)
Immediate Anesthesia Transfer of Care Note  Patient: Christy Zhang  Procedure(s) Performed: Procedure(s): RIGHT UPPER ARM FISTULA SUPERFICIALIZATION (Right) LIGATION OF COMPETING BRANCHE x1 OF RIGHT UPPER ARM ARTERIOVENOUS FISTULA (Right)  Patient Location: PACU  Anesthesia Type:General  Level of Consciousness: awake and alert   Airway & Oxygen Therapy: Patient Spontanous Breathing and Patient connected to nasal cannula oxygen  Post-op Assessment: Report given to RN, Post -op Vital signs reviewed and stable and Patient moving all extremities X 4  Post vital signs: Reviewed and stable  Last Vitals:  Filed Vitals:   11/03/14 0718  BP: 174/78  Pulse: 76  Temp: 36.6 C  Resp: 20    Complications: No apparent anesthesia complications

## 2014-11-03 NOTE — Progress Notes (Signed)
Unable to start IV on patient,  Dr. Aretha Parrot notified,he wiil have CRNA to try.

## 2014-11-03 NOTE — Anesthesia Preprocedure Evaluation (Addendum)
Anesthesia Evaluation  Patient identified by MRN, date of birth, ID band Patient awake    Reviewed: Allergy & Precautions, NPO status , Patient's Chart, lab work & pertinent test results, reviewed documented beta blocker date and time   Airway Mallampati: II  TM Distance: >3 FB Neck ROM: Full    Dental no notable dental hx. (+) Poor Dentition, Missing,    Pulmonary former smoker,  breath sounds clear to auscultation  Pulmonary exam normal       Cardiovascular hypertension, Pt. on medications and Pt. on home beta blockers +CHF Normal cardiovascular examRhythm:Regular Rate:Normal     Neuro/Psych negative neurological ROS  negative psych ROS   GI/Hepatic negative GI ROS, Neg liver ROS,   Endo/Other  negative endocrine ROSdiabetes, Type 2, Oral Hypoglycemic AgentsMorbid obesity  Renal/GU ESRFRenal disease  negative genitourinary   Musculoskeletal negative musculoskeletal ROS (+) Arthritis -,   Abdominal   Peds negative pediatric ROS (+)  Hematology  (+) anemia ,   Anesthesia Other Findings   Reproductive/Obstetrics negative OB ROS                           Anesthesia Physical  Anesthesia Plan  ASA: III  Anesthesia Plan: General   Post-op Pain Management:    Induction: Intravenous  Airway Management Planned: LMA  Additional Equipment:   Intra-op Plan:   Post-operative Plan: Extubation in OR  Informed Consent: I have reviewed the patients History and Physical, chart, labs and discussed the procedure including the risks, benefits and alternatives for the proposed anesthesia with the patient or authorized representative who has indicated his/her understanding and acceptance.   Dental advisory given  Plan Discussed with: CRNA  Anesthesia Plan Comments:        Anesthesia Quick Evaluation

## 2014-11-03 NOTE — Anesthesia Postprocedure Evaluation (Signed)
Anesthesia Post Note  Patient: Christy Zhang  Procedure(s) Performed: Procedure(s) (LRB): RIGHT UPPER ARM FISTULA SUPERFICIALIZATION (Right) LIGATION OF COMPETING BRANCHE x1 OF RIGHT UPPER ARM ARTERIOVENOUS FISTULA (Right)  Anesthesia type: General  Patient location: PACU  Post pain: Pain level controlled  Post assessment: Post-op Vital signs reviewed  Last Vitals: BP 125/42 mmHg  Pulse 77  Temp(Src) 36.8 C (Oral)  Resp 12  Ht 5\' 4"  (1.626 m)  Wt 250 lb (113.399 kg)  BMI 42.89 kg/m2  SpO2 97%  Post vital signs: Reviewed  Level of consciousness: sedated  Complications: No apparent anesthesia complications

## 2014-11-03 NOTE — Discharge Instructions (Signed)
° ° °  11/03/2014 Christy Zhang [id number] 1955/08/24  Surgeon(s): Mal Misty, MD  Procedure(s): RIGHT UPPER ARM FISTULA SUPERFICIALIZATION LIGATION OF COMPETING BRANCHE x1 OF RIGHT UPPER ARM ARTERIOVENOUS FISTULA  x Do not stick fistula for 6 weeks

## 2014-11-03 NOTE — Interval H&P Note (Signed)
History and Physical Interval Note:  11/03/2014 9:47 AM  Christy Zhang  has presented today for surgery, with the diagnosis of End Stage Renal Disease N18.6  The various methods of treatment have been discussed with the patient and family. After consideration of risks, benefits and other options for treatment, the patient has consented to  Procedure(s): FISTULA SUPERFICIALIZATION (Right) as a surgical intervention .  The patient's history has been reviewed, patient examined, no change in status, stable for surgery.  I have reviewed the patient's chart and labs.  Questions were answered to the patient's satisfaction.     Tinnie Gens

## 2014-11-03 NOTE — Op Note (Signed)
OPERATIVE REPORT  Date of Surgery: 11/03/2014  Surgeon: Tinnie Gens, MD  Assistant: Dionicio Stall  Pre-op Diagnosis: End Stage Renal Disease N18.6 with right upper arm AV fistula-too deep to access  Post-op Diagnosis: End Stage Renal Disease N18.6 to deep to access right upper arm fistula  Procedure: Procedure(s): RIGHT UPPER ARM FISTULA SUPERFICIALIZATION LIGATION OF COMPETING BRANCHE x1 OF RIGHT UPPER ARM ARTERIOVENOUS FISTULA  Anesthesia: LMA  EBL: Minimal  Complications: None  Procedure Details: The patient was taken operating room placed in the supine position at which time satisfactory general-LMA anesthesia was administered. The right upper extremity was prepped Betadine scrub and solution draped in routine sterile manner. The cephalic vein was mobilized in the upper arm through 3 separate incisions. It was circumferentially mobilized. One large competing branch was ligated with 3-0 silk tie and divided. The vein was quite deep in the arm. After mobilizing it from the antecubital area up to the deltopectoral groove the subcutaneous tissue was closed deep to this using interrupted 30 Vicryls. Vein was placed as superficial as possible and after adequate hemostasis was achieved the wound was closed in layers with Vicryls and 30 and 4-0 subcuticular fashion sterile dressing applied patient taken to recovery room in satisfactory condition   Tinnie Gens, MD 11/03/2014 11:30 AM

## 2014-11-03 NOTE — H&P (View-Only) (Signed)
Subjective:     Patient ID: Christy Zhang, female   DOB: 06/13/1955, 59 y.o.   MRN: [id number]  HPI this 59 year old female with end-stage renal disease has hemodialysis Monday Wednesday and Friday in [place]. She had a right brachial cephalic AV fistula created by me on 08/25/2014. She is currently being dialyzed through a right IJ dialysis catheter which was inserted by Dr. Bary Leriche. Patient is evaluated for her first postoperative visit today. She denies any pain or numbness in the right hand.  Past Medical History  Diagnosis Date  . Hypertension   . Anemia   . CHF (congestive heart failure)   . Chronic kidney disease     Renal failure, dialysis on M/W/F  . Asthma     in the past  . Diabetes mellitus without complication     Type 2  . Arthritis   . Neuromuscular disorder     neuropathy    History  Substance Use Topics  . Smoking status: Former Smoker    Types: Cigarettes    Quit date: 07/25/1984  . Smokeless tobacco: Never Used  . Alcohol Use: No    Family History  Problem Relation Age of Onset  . Diabetes Mother   . Hypertension Mother   . Cancer Father   . Diabetes Father   . Hypertension Sister   . Diabetes Daughter   . Heart disease Daughter   . Hypertension Daughter   . Diabetes Son   . Heart disease Son   . Hypertension Son   . Peripheral vascular disease Son     amputation    No Known Allergies   Current outpatient prescriptions:  .  acetaminophen (TYLENOL) 500 MG tablet, Take 500 mg by mouth every 8 (eight) hours as needed for mild pain., Disp: , Rfl:  .  amLODipine (NORVASC) 10 MG tablet, Take 10 mg by mouth daily., Disp: , Rfl:  .  azithromycin (ZITHROMAX) 250 MG tablet, Take 250 mg by mouth daily., Disp: , Rfl:  .  diphenhydrAMINE (BENADRYL) 25 mg capsule, Take 25 mg by mouth every 8 (eight) hours as needed for itching., Disp: , Rfl:  .  ergocalciferol (VITAMIN D2) 50000 UNITS capsule, Take 50,000 Units by mouth once a week., Disp: , Rfl:  .   guaifenesin (HUMIBID E) 400 MG TABS tablet, Take 400 mg by mouth 3 (three) times daily., Disp: , Rfl:  .  insulin lispro protamine-lispro (HUMALOG 75/25 MIX) (75-25) 100 UNIT/ML SUSP injection, Inject 30 Units into the skin 2 (two) times daily with a meal. , Disp: , Rfl:  .  metoprolol succinate (TOPROL-XL) 100 MG 24 hr tablet, Take 100 mg by mouth daily. Take with or immediately following a meal., Disp: , Rfl:  .  oxyCODONE-acetaminophen (PERCOCET/ROXICET) 5-325 MG per tablet, Take 1 tablet by mouth every 6 (six) hours as needed., Disp: 30 tablet, Rfl: 0 .  OXYGEN, Inhale 2 L into the lungs at bedtime. As needed, Disp: , Rfl:   Filed Vitals:   10/18/14 0842  BP: 182/64  Pulse: 84  Temp: 98.2 F (36.8 C)  TempSrc: Oral  Resp: 20  Height: 5\' 4"  (1.626 m)  Weight: 250 lb (113.399 kg)  SpO2: 100%    Body mass index is 42.89 kg/(m^2).           Review of Systems denies chest pain, dyspnea on exertion, PND, orthopnea, hemoptysis     Objective:   Physical Exam BP 182/64 mmHg  Pulse 84  Temp(Src) 98.2 F (36.8  C) (Oral)  Resp 20  Ht 5\' 4"  (1.626 m)  Wt 250 lb (113.399 kg)  BMI 42.89 kg/m2  SpO2 100%  Gen. well-developed well-nourished obese female no apparent distress alert and oriented 3 Lungs no rhonchi or wheezing Cardiovascular regular rhythm no murmurs Abdomen obese Right upper extremity with well-healed antecubital wound. Good pulse and palpable thrill and AV fistula-brachial-cephalic.  I reviewed the fistula duplex exam which was performed 09/22/2014. This reveals the fistula be widely patent with one branch in the mid upper arm. It is relatively deep in location.     Assessment:     Patient with end-stage renal disease with right brachial-cephalic AV fistula which is somewhat deep in location and 1 competing branch and mid upper arm    Plan:     We'll schedule superficialization of fistula and ligation of competing branch for Thursday, June 23. Access will  be ready to use 11/24/2014

## 2014-11-03 NOTE — Anesthesia Procedure Notes (Signed)
Procedure Name: LMA Insertion Date/Time: 11/03/2014 10:23 AM Performed by: Garrison Columbus T Pre-anesthesia Checklist: Patient identified, Emergency Drugs available, Suction available and Patient being monitored Patient Re-evaluated:Patient Re-evaluated prior to inductionOxygen Delivery Method: Circle system utilized Preoxygenation: Pre-oxygenation with 100% oxygen Intubation Type: IV induction LMA: LMA inserted LMA Size: 4.0 Number of attempts: 1 Placement Confirmation: positive ETCO2 and breath sounds checked- equal and bilateral Tube secured with: Tape Dental Injury: Teeth and Oropharynx as per pre-operative assessment

## 2014-11-04 ENCOUNTER — Encounter (HOSPITAL_COMMUNITY): Payer: Self-pay | Admitting: Vascular Surgery

## 2014-11-04 ENCOUNTER — Telehealth: Payer: Self-pay | Admitting: Vascular Surgery

## 2014-11-04 NOTE — Telephone Encounter (Addendum)
-----   Message from Mena Goes, RN sent at 11/03/2014 11:47 AM EDT ----- Regarding: Schedule   ----- Message -----    From: Gabriel Earing, PA-C    Sent: 11/03/2014  11:30 AM      To: [facility] Charge Pool  S/p superficialization of right upper arm fistula 11/03/14.  F/u in 4 weeks with Dr. Kellie Simmering.  She does not need a duplex.  Thanks, Samantha  notified pateint of  post op appt. on 11-29-14 8:30 with dr. Kellie Simmering

## 2014-11-14 ENCOUNTER — Encounter (HOSPITAL_COMMUNITY): Payer: Self-pay

## 2014-11-14 ENCOUNTER — Other Ambulatory Visit (HOSPITAL_COMMUNITY): Payer: Self-pay | Admitting: Nephrology

## 2014-11-14 ENCOUNTER — Ambulatory Visit (HOSPITAL_COMMUNITY)
Admission: RE | Admit: 2014-11-14 | Discharge: 2014-11-14 | Disposition: A | Discharge status: Home / Self Care | Payer: Medicaid Other | Source: Ambulatory Visit | Attending: Nephrology | Admitting: Nephrology

## 2014-11-14 DIAGNOSIS — Z992 Dependence on renal dialysis: Secondary | ICD-10-CM | POA: Insufficient documentation

## 2014-11-14 DIAGNOSIS — N19 Unspecified kidney failure: Secondary | ICD-10-CM | POA: Insufficient documentation

## 2014-11-14 DIAGNOSIS — N186 End stage renal disease: Secondary | ICD-10-CM

## 2014-11-14 DIAGNOSIS — Z4901 Encounter for fitting and adjustment of extracorporeal dialysis catheter: Secondary | ICD-10-CM | POA: Diagnosis present

## 2014-11-14 MED ORDER — FENTANYL CITRATE (PF) 100 MCG/2ML IJ SOLN
INTRAMUSCULAR | Status: AC
  Filled 2014-11-14: qty 2

## 2014-11-14 MED ORDER — CHLORHEXIDINE GLUCONATE 4 % EX LIQD
CUTANEOUS | Status: AC
  Filled 2014-11-14: qty 15

## 2014-11-14 MED ORDER — MIDAZOLAM HCL 2 MG/2ML IJ SOLN
INTRAMUSCULAR | Status: AC | PRN
  Administered 2014-11-14: 1 mg via INTRAVENOUS

## 2014-11-14 MED ORDER — FENTANYL CITRATE (PF) 100 MCG/2ML IJ SOLN
INTRAMUSCULAR | Status: AC | PRN
  Administered 2014-11-14 (×2): 50 ug via INTRAVENOUS

## 2014-11-14 MED ORDER — CEFAZOLIN SODIUM-DEXTROSE 2-3 GM-% IV SOLR
INTRAVENOUS | Status: AC
  Filled 2014-11-14: qty 50

## 2014-11-14 MED ORDER — HEPARIN SODIUM (PORCINE) 1000 UNIT/ML IJ SOLN
INTRAMUSCULAR | Status: AC
  Filled 2014-11-14: qty 1

## 2014-11-14 MED ORDER — MIDAZOLAM HCL 2 MG/2ML IJ SOLN
INTRAMUSCULAR | Status: AC
  Filled 2014-11-14: qty 2

## 2014-11-14 MED ORDER — LIDOCAINE HCL 1 % IJ SOLN
INTRAMUSCULAR | Status: AC
  Filled 2014-11-14: qty 20

## 2014-11-14 MED ORDER — CEFAZOLIN SODIUM-DEXTROSE 2-3 GM-% IV SOLR
2.0000 g | Freq: Once | INTRAVENOUS | Status: AC
  Administered 2014-11-14: 2 g via INTRAVENOUS

## 2014-11-14 NOTE — Procedures (Signed)
Interventional Radiology Procedure Note  Procedure: US guided left brachial IV start Fluoro guided right IJ HD catheter exchange.   New 19cm tip to cuff catheter.  Operable on completion. Excellent thrill documented at the right antecubital region.  Complications: None. Recommendations:  - OK to use catheter - DC home after recovery  Signed,  Dulcy Fanny. Earleen Newport, DO

## 2014-11-24 ENCOUNTER — Encounter: Payer: Self-pay | Admitting: Vascular Surgery

## 2014-11-27 DIAGNOSIS — Z992 Dependence on renal dialysis: Secondary | ICD-10-CM | POA: Diagnosis not present

## 2014-11-27 DIAGNOSIS — N186 End stage renal disease: Secondary | ICD-10-CM | POA: Diagnosis not present

## 2014-11-28 DIAGNOSIS — Z992 Dependence on renal dialysis: Secondary | ICD-10-CM | POA: Diagnosis not present

## 2014-11-28 DIAGNOSIS — D509 Iron deficiency anemia, unspecified: Secondary | ICD-10-CM | POA: Diagnosis not present

## 2014-11-28 DIAGNOSIS — N186 End stage renal disease: Secondary | ICD-10-CM | POA: Diagnosis not present

## 2014-11-28 DIAGNOSIS — D631 Anemia in chronic kidney disease: Secondary | ICD-10-CM | POA: Diagnosis not present

## 2014-11-29 ENCOUNTER — Ambulatory Visit (INDEPENDENT_AMBULATORY_CARE_PROVIDER_SITE_OTHER): Payer: Self-pay | Admitting: Vascular Surgery

## 2014-11-29 ENCOUNTER — Encounter: Payer: Self-pay | Admitting: Vascular Surgery

## 2014-11-29 VITALS — BP 220/110 | HR 82 | Temp 97.8°F | Resp 20 | Ht 64.0 in | Wt 248.0 lb

## 2014-11-29 DIAGNOSIS — N186 End stage renal disease: Secondary | ICD-10-CM

## 2014-11-29 NOTE — Progress Notes (Signed)
Subjective:     Patient ID: Christy Zhang, female   DOB: 11-07-1955, 59 y.o.   MRN: [id number]  HPI this 59 year old female returns for further follow-up regarding [name] which I created on 08/25/2014. She is currently being dialyzed through a catheter in her right IJ. She had a procedure to superficialize the fistula 4 weeks ago and ligate one competing branch. She denies any pain or numbness in the right hand.   Review of Systems     Objective:   Physical Exam BP 220/110 mmHg  Pulse 82  Temp(Src) 97.8 F (36.6 C) (Oral)  Resp 20  Ht 5\' 4"  (1.626 m)  Wt 248 lb (112.492 kg)  BMI 42.55 kg/m2  SpO2 97%  Gen. well-developed well-nourished female in no apparent distress alert and oriented 3 Lungs no rhonchi or wheezing Incisions and right upper extremity revealed nicely. Excellent pulse and palpable thrill and upper arm brachial-cephalic AV fistula. 2+ radial pulse palpable distally.     Assessment:     Nice maturation of right brachial-cephalic AV fistula-ready for access attempt    Plan:     Okay to utilize right upper arm fistula this week. Would recommend utilizing this for at least a week or 2 before his continuing chest catheter because there have been problems with the catheter Return to see me on when necessary basis Patient advised to contact her medical doctor for blood pressure control. Blood pressure was high in the office today and she had not taken her medications prior to the visit

## 2014-11-30 DIAGNOSIS — D631 Anemia in chronic kidney disease: Secondary | ICD-10-CM | POA: Diagnosis not present

## 2014-11-30 DIAGNOSIS — N186 End stage renal disease: Secondary | ICD-10-CM | POA: Diagnosis not present

## 2014-11-30 DIAGNOSIS — Z992 Dependence on renal dialysis: Secondary | ICD-10-CM | POA: Diagnosis not present

## 2014-11-30 DIAGNOSIS — D509 Iron deficiency anemia, unspecified: Secondary | ICD-10-CM | POA: Diagnosis not present

## 2014-12-02 DIAGNOSIS — N186 End stage renal disease: Secondary | ICD-10-CM | POA: Diagnosis not present

## 2014-12-02 DIAGNOSIS — Z992 Dependence on renal dialysis: Secondary | ICD-10-CM | POA: Diagnosis not present

## 2014-12-02 DIAGNOSIS — D509 Iron deficiency anemia, unspecified: Secondary | ICD-10-CM | POA: Diagnosis not present

## 2014-12-02 DIAGNOSIS — D631 Anemia in chronic kidney disease: Secondary | ICD-10-CM | POA: Diagnosis not present

## 2014-12-05 DIAGNOSIS — N186 End stage renal disease: Secondary | ICD-10-CM | POA: Diagnosis not present

## 2014-12-05 DIAGNOSIS — D509 Iron deficiency anemia, unspecified: Secondary | ICD-10-CM | POA: Diagnosis not present

## 2014-12-05 DIAGNOSIS — Z992 Dependence on renal dialysis: Secondary | ICD-10-CM | POA: Diagnosis not present

## 2014-12-05 DIAGNOSIS — D631 Anemia in chronic kidney disease: Secondary | ICD-10-CM | POA: Diagnosis not present

## 2014-12-07 DIAGNOSIS — N186 End stage renal disease: Secondary | ICD-10-CM | POA: Diagnosis not present

## 2014-12-07 DIAGNOSIS — D631 Anemia in chronic kidney disease: Secondary | ICD-10-CM | POA: Diagnosis not present

## 2014-12-07 DIAGNOSIS — D509 Iron deficiency anemia, unspecified: Secondary | ICD-10-CM | POA: Diagnosis not present

## 2014-12-07 DIAGNOSIS — Z992 Dependence on renal dialysis: Secondary | ICD-10-CM | POA: Diagnosis not present

## 2014-12-08 DIAGNOSIS — Z992 Dependence on renal dialysis: Secondary | ICD-10-CM | POA: Diagnosis not present

## 2014-12-08 DIAGNOSIS — Z87891 Personal history of nicotine dependence: Secondary | ICD-10-CM | POA: Diagnosis not present

## 2014-12-08 DIAGNOSIS — E119 Type 2 diabetes mellitus without complications: Secondary | ICD-10-CM | POA: Diagnosis not present

## 2014-12-08 DIAGNOSIS — Z794 Long term (current) use of insulin: Secondary | ICD-10-CM | POA: Diagnosis not present

## 2014-12-08 DIAGNOSIS — I120 Hypertensive chronic kidney disease with stage 5 chronic kidney disease or end stage renal disease: Secondary | ICD-10-CM | POA: Diagnosis not present

## 2014-12-08 DIAGNOSIS — Z79899 Other long term (current) drug therapy: Secondary | ICD-10-CM | POA: Diagnosis not present

## 2014-12-08 DIAGNOSIS — L0231 Cutaneous abscess of buttock: Secondary | ICD-10-CM | POA: Diagnosis not present

## 2014-12-08 DIAGNOSIS — N186 End stage renal disease: Secondary | ICD-10-CM | POA: Diagnosis not present

## 2014-12-09 DIAGNOSIS — D631 Anemia in chronic kidney disease: Secondary | ICD-10-CM | POA: Diagnosis not present

## 2014-12-09 DIAGNOSIS — N186 End stage renal disease: Secondary | ICD-10-CM | POA: Diagnosis not present

## 2014-12-09 DIAGNOSIS — D509 Iron deficiency anemia, unspecified: Secondary | ICD-10-CM | POA: Diagnosis not present

## 2014-12-09 DIAGNOSIS — Z992 Dependence on renal dialysis: Secondary | ICD-10-CM | POA: Diagnosis not present

## 2014-12-10 DIAGNOSIS — Z4800 Encounter for change or removal of nonsurgical wound dressing: Secondary | ICD-10-CM | POA: Diagnosis not present

## 2014-12-10 DIAGNOSIS — Z4801 Encounter for change or removal of surgical wound dressing: Secondary | ICD-10-CM | POA: Diagnosis not present

## 2014-12-10 DIAGNOSIS — L0231 Cutaneous abscess of buttock: Secondary | ICD-10-CM | POA: Diagnosis not present

## 2014-12-12 DIAGNOSIS — D631 Anemia in chronic kidney disease: Secondary | ICD-10-CM | POA: Diagnosis not present

## 2014-12-12 DIAGNOSIS — N186 End stage renal disease: Secondary | ICD-10-CM | POA: Diagnosis not present

## 2014-12-12 DIAGNOSIS — D509 Iron deficiency anemia, unspecified: Secondary | ICD-10-CM | POA: Diagnosis not present

## 2014-12-12 DIAGNOSIS — Z992 Dependence on renal dialysis: Secondary | ICD-10-CM | POA: Diagnosis not present

## 2014-12-14 DIAGNOSIS — N186 End stage renal disease: Secondary | ICD-10-CM | POA: Diagnosis not present

## 2014-12-14 DIAGNOSIS — Z992 Dependence on renal dialysis: Secondary | ICD-10-CM | POA: Diagnosis not present

## 2014-12-14 DIAGNOSIS — D631 Anemia in chronic kidney disease: Secondary | ICD-10-CM | POA: Diagnosis not present

## 2014-12-14 DIAGNOSIS — D509 Iron deficiency anemia, unspecified: Secondary | ICD-10-CM | POA: Diagnosis not present

## 2014-12-16 ENCOUNTER — Other Ambulatory Visit (HOSPITAL_COMMUNITY): Payer: Self-pay | Admitting: Nephrology

## 2014-12-16 DIAGNOSIS — Z992 Dependence on renal dialysis: Secondary | ICD-10-CM | POA: Diagnosis not present

## 2014-12-16 DIAGNOSIS — D631 Anemia in chronic kidney disease: Secondary | ICD-10-CM | POA: Diagnosis not present

## 2014-12-16 DIAGNOSIS — N186 End stage renal disease: Secondary | ICD-10-CM

## 2014-12-16 DIAGNOSIS — D509 Iron deficiency anemia, unspecified: Secondary | ICD-10-CM | POA: Diagnosis not present

## 2014-12-19 DIAGNOSIS — N186 End stage renal disease: Secondary | ICD-10-CM | POA: Diagnosis not present

## 2014-12-19 DIAGNOSIS — D509 Iron deficiency anemia, unspecified: Secondary | ICD-10-CM | POA: Diagnosis not present

## 2014-12-19 DIAGNOSIS — D631 Anemia in chronic kidney disease: Secondary | ICD-10-CM | POA: Diagnosis not present

## 2014-12-19 DIAGNOSIS — Z992 Dependence on renal dialysis: Secondary | ICD-10-CM | POA: Diagnosis not present

## 2014-12-20 ENCOUNTER — Ambulatory Visit (HOSPITAL_COMMUNITY)
Admission: RE | Admit: 2014-12-20 | Discharge: 2014-12-20 | Disposition: A | Discharge status: Home / Self Care | Payer: Medicare Other | Source: Ambulatory Visit | Attending: Nephrology | Admitting: Nephrology

## 2014-12-20 DIAGNOSIS — N186 End stage renal disease: Secondary | ICD-10-CM

## 2014-12-20 DIAGNOSIS — Z452 Encounter for adjustment and management of vascular access device: Secondary | ICD-10-CM | POA: Diagnosis not present

## 2014-12-20 MED ORDER — LIDOCAINE HCL 1 % IJ SOLN
INTRAMUSCULAR | Status: AC
  Filled 2014-12-20: qty 20

## 2014-12-20 MED ORDER — CHLORHEXIDINE GLUCONATE 4 % EX LIQD
CUTANEOUS | Status: AC
  Filled 2014-12-20: qty 15

## 2014-12-20 NOTE — Progress Notes (Signed)
Dressing to R chest d/i denies pain discharged with daughter

## 2014-12-20 NOTE — Procedures (Signed)
Successful RT IJ HD cath removal No comp Stable Full report in PACS

## 2014-12-21 DIAGNOSIS — Z992 Dependence on renal dialysis: Secondary | ICD-10-CM | POA: Diagnosis not present

## 2014-12-21 DIAGNOSIS — N186 End stage renal disease: Secondary | ICD-10-CM | POA: Diagnosis not present

## 2014-12-21 DIAGNOSIS — D631 Anemia in chronic kidney disease: Secondary | ICD-10-CM | POA: Diagnosis not present

## 2014-12-21 DIAGNOSIS — D509 Iron deficiency anemia, unspecified: Secondary | ICD-10-CM | POA: Diagnosis not present

## 2014-12-23 DIAGNOSIS — D631 Anemia in chronic kidney disease: Secondary | ICD-10-CM | POA: Diagnosis not present

## 2014-12-23 DIAGNOSIS — Z992 Dependence on renal dialysis: Secondary | ICD-10-CM | POA: Diagnosis not present

## 2014-12-23 DIAGNOSIS — N186 End stage renal disease: Secondary | ICD-10-CM | POA: Diagnosis not present

## 2014-12-23 DIAGNOSIS — D509 Iron deficiency anemia, unspecified: Secondary | ICD-10-CM | POA: Diagnosis not present

## 2014-12-26 DIAGNOSIS — D631 Anemia in chronic kidney disease: Secondary | ICD-10-CM | POA: Diagnosis not present

## 2014-12-26 DIAGNOSIS — Z992 Dependence on renal dialysis: Secondary | ICD-10-CM | POA: Diagnosis not present

## 2014-12-26 DIAGNOSIS — D509 Iron deficiency anemia, unspecified: Secondary | ICD-10-CM | POA: Diagnosis not present

## 2014-12-26 DIAGNOSIS — N186 End stage renal disease: Secondary | ICD-10-CM | POA: Diagnosis not present

## 2014-12-28 DIAGNOSIS — N186 End stage renal disease: Secondary | ICD-10-CM | POA: Diagnosis not present

## 2014-12-28 DIAGNOSIS — Z992 Dependence on renal dialysis: Secondary | ICD-10-CM | POA: Diagnosis not present

## 2014-12-28 DIAGNOSIS — D509 Iron deficiency anemia, unspecified: Secondary | ICD-10-CM | POA: Diagnosis not present

## 2014-12-28 DIAGNOSIS — D631 Anemia in chronic kidney disease: Secondary | ICD-10-CM | POA: Diagnosis not present

## 2014-12-30 DIAGNOSIS — D631 Anemia in chronic kidney disease: Secondary | ICD-10-CM | POA: Diagnosis not present

## 2014-12-30 DIAGNOSIS — N186 End stage renal disease: Secondary | ICD-10-CM | POA: Diagnosis not present

## 2014-12-30 DIAGNOSIS — Z992 Dependence on renal dialysis: Secondary | ICD-10-CM | POA: Diagnosis not present

## 2014-12-30 DIAGNOSIS — D509 Iron deficiency anemia, unspecified: Secondary | ICD-10-CM | POA: Diagnosis not present

## 2014-12-30 DIAGNOSIS — Z23 Encounter for immunization: Secondary | ICD-10-CM | POA: Diagnosis not present

## 2014-12-30 DIAGNOSIS — N2581 Secondary hyperparathyroidism of renal origin: Secondary | ICD-10-CM | POA: Diagnosis not present

## 2015-01-02 DIAGNOSIS — D631 Anemia in chronic kidney disease: Secondary | ICD-10-CM | POA: Diagnosis not present

## 2015-01-02 DIAGNOSIS — Z23 Encounter for immunization: Secondary | ICD-10-CM | POA: Diagnosis not present

## 2015-01-02 DIAGNOSIS — N186 End stage renal disease: Secondary | ICD-10-CM | POA: Diagnosis not present

## 2015-01-02 DIAGNOSIS — N2581 Secondary hyperparathyroidism of renal origin: Secondary | ICD-10-CM | POA: Diagnosis not present

## 2015-01-02 DIAGNOSIS — Z992 Dependence on renal dialysis: Secondary | ICD-10-CM | POA: Diagnosis not present

## 2015-01-02 DIAGNOSIS — D509 Iron deficiency anemia, unspecified: Secondary | ICD-10-CM | POA: Diagnosis not present

## 2015-01-04 DIAGNOSIS — D631 Anemia in chronic kidney disease: Secondary | ICD-10-CM | POA: Diagnosis not present

## 2015-01-04 DIAGNOSIS — Z992 Dependence on renal dialysis: Secondary | ICD-10-CM | POA: Diagnosis not present

## 2015-01-04 DIAGNOSIS — D509 Iron deficiency anemia, unspecified: Secondary | ICD-10-CM | POA: Diagnosis not present

## 2015-01-04 DIAGNOSIS — N186 End stage renal disease: Secondary | ICD-10-CM | POA: Diagnosis not present

## 2015-01-04 DIAGNOSIS — Z23 Encounter for immunization: Secondary | ICD-10-CM | POA: Diagnosis not present

## 2015-01-04 DIAGNOSIS — N2581 Secondary hyperparathyroidism of renal origin: Secondary | ICD-10-CM | POA: Diagnosis not present

## 2015-01-06 DIAGNOSIS — D509 Iron deficiency anemia, unspecified: Secondary | ICD-10-CM | POA: Diagnosis not present

## 2015-01-06 DIAGNOSIS — Z992 Dependence on renal dialysis: Secondary | ICD-10-CM | POA: Diagnosis not present

## 2015-01-06 DIAGNOSIS — N186 End stage renal disease: Secondary | ICD-10-CM | POA: Diagnosis not present

## 2015-01-06 DIAGNOSIS — N2581 Secondary hyperparathyroidism of renal origin: Secondary | ICD-10-CM | POA: Diagnosis not present

## 2015-01-06 DIAGNOSIS — Z23 Encounter for immunization: Secondary | ICD-10-CM | POA: Diagnosis not present

## 2015-01-06 DIAGNOSIS — D631 Anemia in chronic kidney disease: Secondary | ICD-10-CM | POA: Diagnosis not present

## 2015-01-09 DIAGNOSIS — D509 Iron deficiency anemia, unspecified: Secondary | ICD-10-CM | POA: Diagnosis not present

## 2015-01-09 DIAGNOSIS — D631 Anemia in chronic kidney disease: Secondary | ICD-10-CM | POA: Diagnosis not present

## 2015-01-09 DIAGNOSIS — N2581 Secondary hyperparathyroidism of renal origin: Secondary | ICD-10-CM | POA: Diagnosis not present

## 2015-01-09 DIAGNOSIS — Z992 Dependence on renal dialysis: Secondary | ICD-10-CM | POA: Diagnosis not present

## 2015-01-09 DIAGNOSIS — Z23 Encounter for immunization: Secondary | ICD-10-CM | POA: Diagnosis not present

## 2015-01-09 DIAGNOSIS — N186 End stage renal disease: Secondary | ICD-10-CM | POA: Diagnosis not present

## 2015-01-11 DIAGNOSIS — Z992 Dependence on renal dialysis: Secondary | ICD-10-CM | POA: Diagnosis not present

## 2015-01-11 DIAGNOSIS — D509 Iron deficiency anemia, unspecified: Secondary | ICD-10-CM | POA: Diagnosis not present

## 2015-01-11 DIAGNOSIS — Z23 Encounter for immunization: Secondary | ICD-10-CM | POA: Diagnosis not present

## 2015-01-11 DIAGNOSIS — N2581 Secondary hyperparathyroidism of renal origin: Secondary | ICD-10-CM | POA: Diagnosis not present

## 2015-01-11 DIAGNOSIS — D631 Anemia in chronic kidney disease: Secondary | ICD-10-CM | POA: Diagnosis not present

## 2015-01-11 DIAGNOSIS — N186 End stage renal disease: Secondary | ICD-10-CM | POA: Diagnosis not present

## 2015-01-13 DIAGNOSIS — Z23 Encounter for immunization: Secondary | ICD-10-CM | POA: Diagnosis not present

## 2015-01-13 DIAGNOSIS — D631 Anemia in chronic kidney disease: Secondary | ICD-10-CM | POA: Diagnosis not present

## 2015-01-13 DIAGNOSIS — N2581 Secondary hyperparathyroidism of renal origin: Secondary | ICD-10-CM | POA: Diagnosis not present

## 2015-01-13 DIAGNOSIS — D509 Iron deficiency anemia, unspecified: Secondary | ICD-10-CM | POA: Diagnosis not present

## 2015-01-13 DIAGNOSIS — Z992 Dependence on renal dialysis: Secondary | ICD-10-CM | POA: Diagnosis not present

## 2015-01-13 DIAGNOSIS — N186 End stage renal disease: Secondary | ICD-10-CM | POA: Diagnosis not present

## 2015-01-16 DIAGNOSIS — N186 End stage renal disease: Secondary | ICD-10-CM | POA: Diagnosis not present

## 2015-01-16 DIAGNOSIS — Z992 Dependence on renal dialysis: Secondary | ICD-10-CM | POA: Diagnosis not present

## 2015-01-16 DIAGNOSIS — Z23 Encounter for immunization: Secondary | ICD-10-CM | POA: Diagnosis not present

## 2015-01-16 DIAGNOSIS — D509 Iron deficiency anemia, unspecified: Secondary | ICD-10-CM | POA: Diagnosis not present

## 2015-01-16 DIAGNOSIS — N2581 Secondary hyperparathyroidism of renal origin: Secondary | ICD-10-CM | POA: Diagnosis not present

## 2015-01-16 DIAGNOSIS — D631 Anemia in chronic kidney disease: Secondary | ICD-10-CM | POA: Diagnosis not present

## 2015-01-18 DIAGNOSIS — Z23 Encounter for immunization: Secondary | ICD-10-CM | POA: Diagnosis not present

## 2015-01-18 DIAGNOSIS — N186 End stage renal disease: Secondary | ICD-10-CM | POA: Diagnosis not present

## 2015-01-18 DIAGNOSIS — D631 Anemia in chronic kidney disease: Secondary | ICD-10-CM | POA: Diagnosis not present

## 2015-01-18 DIAGNOSIS — D509 Iron deficiency anemia, unspecified: Secondary | ICD-10-CM | POA: Diagnosis not present

## 2015-01-18 DIAGNOSIS — N2581 Secondary hyperparathyroidism of renal origin: Secondary | ICD-10-CM | POA: Diagnosis not present

## 2015-01-18 DIAGNOSIS — Z992 Dependence on renal dialysis: Secondary | ICD-10-CM | POA: Diagnosis not present

## 2015-01-20 DIAGNOSIS — N2581 Secondary hyperparathyroidism of renal origin: Secondary | ICD-10-CM | POA: Diagnosis not present

## 2015-01-20 DIAGNOSIS — N186 End stage renal disease: Secondary | ICD-10-CM | POA: Diagnosis not present

## 2015-01-20 DIAGNOSIS — Z23 Encounter for immunization: Secondary | ICD-10-CM | POA: Diagnosis not present

## 2015-01-20 DIAGNOSIS — D509 Iron deficiency anemia, unspecified: Secondary | ICD-10-CM | POA: Diagnosis not present

## 2015-01-20 DIAGNOSIS — D631 Anemia in chronic kidney disease: Secondary | ICD-10-CM | POA: Diagnosis not present

## 2015-01-20 DIAGNOSIS — Z992 Dependence on renal dialysis: Secondary | ICD-10-CM | POA: Diagnosis not present

## 2015-01-23 DIAGNOSIS — Z992 Dependence on renal dialysis: Secondary | ICD-10-CM | POA: Diagnosis not present

## 2015-01-23 DIAGNOSIS — Z23 Encounter for immunization: Secondary | ICD-10-CM | POA: Diagnosis not present

## 2015-01-23 DIAGNOSIS — D631 Anemia in chronic kidney disease: Secondary | ICD-10-CM | POA: Diagnosis not present

## 2015-01-23 DIAGNOSIS — D509 Iron deficiency anemia, unspecified: Secondary | ICD-10-CM | POA: Diagnosis not present

## 2015-01-23 DIAGNOSIS — N186 End stage renal disease: Secondary | ICD-10-CM | POA: Diagnosis not present

## 2015-01-23 DIAGNOSIS — N2581 Secondary hyperparathyroidism of renal origin: Secondary | ICD-10-CM | POA: Diagnosis not present

## 2015-01-25 DIAGNOSIS — Z992 Dependence on renal dialysis: Secondary | ICD-10-CM | POA: Diagnosis not present

## 2015-01-25 DIAGNOSIS — N186 End stage renal disease: Secondary | ICD-10-CM | POA: Diagnosis not present

## 2015-01-25 DIAGNOSIS — N2581 Secondary hyperparathyroidism of renal origin: Secondary | ICD-10-CM | POA: Diagnosis not present

## 2015-01-25 DIAGNOSIS — D631 Anemia in chronic kidney disease: Secondary | ICD-10-CM | POA: Diagnosis not present

## 2015-01-25 DIAGNOSIS — D509 Iron deficiency anemia, unspecified: Secondary | ICD-10-CM | POA: Diagnosis not present

## 2015-01-25 DIAGNOSIS — Z23 Encounter for immunization: Secondary | ICD-10-CM | POA: Diagnosis not present

## 2015-01-27 DIAGNOSIS — N2581 Secondary hyperparathyroidism of renal origin: Secondary | ICD-10-CM | POA: Diagnosis not present

## 2015-01-27 DIAGNOSIS — N186 End stage renal disease: Secondary | ICD-10-CM | POA: Diagnosis not present

## 2015-01-27 DIAGNOSIS — D631 Anemia in chronic kidney disease: Secondary | ICD-10-CM | POA: Diagnosis not present

## 2015-01-27 DIAGNOSIS — Z992 Dependence on renal dialysis: Secondary | ICD-10-CM | POA: Diagnosis not present

## 2015-01-27 DIAGNOSIS — D509 Iron deficiency anemia, unspecified: Secondary | ICD-10-CM | POA: Diagnosis not present

## 2015-01-27 DIAGNOSIS — Z23 Encounter for immunization: Secondary | ICD-10-CM | POA: Diagnosis not present

## 2015-01-30 DIAGNOSIS — D509 Iron deficiency anemia, unspecified: Secondary | ICD-10-CM | POA: Diagnosis not present

## 2015-01-30 DIAGNOSIS — D631 Anemia in chronic kidney disease: Secondary | ICD-10-CM | POA: Diagnosis not present

## 2015-01-30 DIAGNOSIS — Z992 Dependence on renal dialysis: Secondary | ICD-10-CM | POA: Diagnosis not present

## 2015-01-30 DIAGNOSIS — N186 End stage renal disease: Secondary | ICD-10-CM | POA: Diagnosis not present

## 2015-02-01 DIAGNOSIS — D631 Anemia in chronic kidney disease: Secondary | ICD-10-CM | POA: Diagnosis not present

## 2015-02-01 DIAGNOSIS — D509 Iron deficiency anemia, unspecified: Secondary | ICD-10-CM | POA: Diagnosis not present

## 2015-02-01 DIAGNOSIS — N186 End stage renal disease: Secondary | ICD-10-CM | POA: Diagnosis not present

## 2015-02-01 DIAGNOSIS — Z992 Dependence on renal dialysis: Secondary | ICD-10-CM | POA: Diagnosis not present

## 2015-02-03 DIAGNOSIS — N186 End stage renal disease: Secondary | ICD-10-CM | POA: Diagnosis not present

## 2015-02-03 DIAGNOSIS — D631 Anemia in chronic kidney disease: Secondary | ICD-10-CM | POA: Diagnosis not present

## 2015-02-03 DIAGNOSIS — D509 Iron deficiency anemia, unspecified: Secondary | ICD-10-CM | POA: Diagnosis not present

## 2015-02-03 DIAGNOSIS — Z992 Dependence on renal dialysis: Secondary | ICD-10-CM | POA: Diagnosis not present

## 2015-02-06 DIAGNOSIS — D509 Iron deficiency anemia, unspecified: Secondary | ICD-10-CM | POA: Diagnosis not present

## 2015-02-06 DIAGNOSIS — Z992 Dependence on renal dialysis: Secondary | ICD-10-CM | POA: Diagnosis not present

## 2015-02-06 DIAGNOSIS — N186 End stage renal disease: Secondary | ICD-10-CM | POA: Diagnosis not present

## 2015-02-06 DIAGNOSIS — D631 Anemia in chronic kidney disease: Secondary | ICD-10-CM | POA: Diagnosis not present

## 2015-02-08 DIAGNOSIS — D509 Iron deficiency anemia, unspecified: Secondary | ICD-10-CM | POA: Diagnosis not present

## 2015-02-08 DIAGNOSIS — Z992 Dependence on renal dialysis: Secondary | ICD-10-CM | POA: Diagnosis not present

## 2015-02-08 DIAGNOSIS — D631 Anemia in chronic kidney disease: Secondary | ICD-10-CM | POA: Diagnosis not present

## 2015-02-08 DIAGNOSIS — N186 End stage renal disease: Secondary | ICD-10-CM | POA: Diagnosis not present

## 2015-02-10 DIAGNOSIS — D509 Iron deficiency anemia, unspecified: Secondary | ICD-10-CM | POA: Diagnosis not present

## 2015-02-10 DIAGNOSIS — D631 Anemia in chronic kidney disease: Secondary | ICD-10-CM | POA: Diagnosis not present

## 2015-02-10 DIAGNOSIS — Z992 Dependence on renal dialysis: Secondary | ICD-10-CM | POA: Diagnosis not present

## 2015-02-10 DIAGNOSIS — N186 End stage renal disease: Secondary | ICD-10-CM | POA: Diagnosis not present

## 2015-02-13 DIAGNOSIS — D509 Iron deficiency anemia, unspecified: Secondary | ICD-10-CM | POA: Diagnosis not present

## 2015-02-13 DIAGNOSIS — D631 Anemia in chronic kidney disease: Secondary | ICD-10-CM | POA: Diagnosis not present

## 2015-02-13 DIAGNOSIS — N186 End stage renal disease: Secondary | ICD-10-CM | POA: Diagnosis not present

## 2015-02-13 DIAGNOSIS — Z992 Dependence on renal dialysis: Secondary | ICD-10-CM | POA: Diagnosis not present

## 2015-02-15 DIAGNOSIS — D509 Iron deficiency anemia, unspecified: Secondary | ICD-10-CM | POA: Diagnosis not present

## 2015-02-15 DIAGNOSIS — D631 Anemia in chronic kidney disease: Secondary | ICD-10-CM | POA: Diagnosis not present

## 2015-02-15 DIAGNOSIS — N186 End stage renal disease: Secondary | ICD-10-CM | POA: Diagnosis not present

## 2015-02-15 DIAGNOSIS — Z992 Dependence on renal dialysis: Secondary | ICD-10-CM | POA: Diagnosis not present

## 2015-02-17 DIAGNOSIS — D638 Anemia in other chronic diseases classified elsewhere: Secondary | ICD-10-CM | POA: Insufficient documentation

## 2015-02-17 DIAGNOSIS — M9080 Osteopathy in diseases classified elsewhere, unspecified site: Secondary | ICD-10-CM | POA: Insufficient documentation

## 2015-02-17 DIAGNOSIS — Z992 Dependence on renal dialysis: Secondary | ICD-10-CM | POA: Diagnosis not present

## 2015-02-17 DIAGNOSIS — D631 Anemia in chronic kidney disease: Secondary | ICD-10-CM | POA: Diagnosis not present

## 2015-02-17 DIAGNOSIS — N186 End stage renal disease: Secondary | ICD-10-CM | POA: Diagnosis not present

## 2015-02-17 DIAGNOSIS — D509 Iron deficiency anemia, unspecified: Secondary | ICD-10-CM | POA: Diagnosis not present

## 2015-02-20 DIAGNOSIS — N186 End stage renal disease: Secondary | ICD-10-CM | POA: Diagnosis not present

## 2015-02-20 DIAGNOSIS — Z992 Dependence on renal dialysis: Secondary | ICD-10-CM | POA: Diagnosis not present

## 2015-02-20 DIAGNOSIS — D509 Iron deficiency anemia, unspecified: Secondary | ICD-10-CM | POA: Diagnosis not present

## 2015-02-20 DIAGNOSIS — D631 Anemia in chronic kidney disease: Secondary | ICD-10-CM | POA: Diagnosis not present

## 2015-02-22 DIAGNOSIS — N186 End stage renal disease: Secondary | ICD-10-CM | POA: Diagnosis not present

## 2015-02-22 DIAGNOSIS — D631 Anemia in chronic kidney disease: Secondary | ICD-10-CM | POA: Diagnosis not present

## 2015-02-22 DIAGNOSIS — Z992 Dependence on renal dialysis: Secondary | ICD-10-CM | POA: Diagnosis not present

## 2015-02-22 DIAGNOSIS — D509 Iron deficiency anemia, unspecified: Secondary | ICD-10-CM | POA: Diagnosis not present

## 2015-02-24 DIAGNOSIS — Z992 Dependence on renal dialysis: Secondary | ICD-10-CM | POA: Diagnosis not present

## 2015-02-24 DIAGNOSIS — D631 Anemia in chronic kidney disease: Secondary | ICD-10-CM | POA: Diagnosis not present

## 2015-02-24 DIAGNOSIS — D509 Iron deficiency anemia, unspecified: Secondary | ICD-10-CM | POA: Diagnosis not present

## 2015-02-24 DIAGNOSIS — N186 End stage renal disease: Secondary | ICD-10-CM | POA: Diagnosis not present

## 2015-02-27 DIAGNOSIS — N186 End stage renal disease: Secondary | ICD-10-CM | POA: Diagnosis not present

## 2015-02-27 DIAGNOSIS — D509 Iron deficiency anemia, unspecified: Secondary | ICD-10-CM | POA: Diagnosis not present

## 2015-02-27 DIAGNOSIS — Z992 Dependence on renal dialysis: Secondary | ICD-10-CM | POA: Diagnosis not present

## 2015-02-27 DIAGNOSIS — D631 Anemia in chronic kidney disease: Secondary | ICD-10-CM | POA: Diagnosis not present

## 2015-03-01 DIAGNOSIS — D509 Iron deficiency anemia, unspecified: Secondary | ICD-10-CM | POA: Diagnosis not present

## 2015-03-01 DIAGNOSIS — Z992 Dependence on renal dialysis: Secondary | ICD-10-CM | POA: Diagnosis not present

## 2015-03-01 DIAGNOSIS — D631 Anemia in chronic kidney disease: Secondary | ICD-10-CM | POA: Diagnosis not present

## 2015-03-01 DIAGNOSIS — N186 End stage renal disease: Secondary | ICD-10-CM | POA: Diagnosis not present

## 2015-03-03 DIAGNOSIS — N186 End stage renal disease: Secondary | ICD-10-CM | POA: Diagnosis not present

## 2015-03-03 DIAGNOSIS — Z992 Dependence on renal dialysis: Secondary | ICD-10-CM | POA: Diagnosis not present

## 2015-03-03 DIAGNOSIS — D631 Anemia in chronic kidney disease: Secondary | ICD-10-CM | POA: Diagnosis not present

## 2015-03-03 DIAGNOSIS — D509 Iron deficiency anemia, unspecified: Secondary | ICD-10-CM | POA: Diagnosis not present

## 2015-03-06 DIAGNOSIS — D509 Iron deficiency anemia, unspecified: Secondary | ICD-10-CM | POA: Diagnosis not present

## 2015-03-06 DIAGNOSIS — D631 Anemia in chronic kidney disease: Secondary | ICD-10-CM | POA: Diagnosis not present

## 2015-03-06 DIAGNOSIS — N186 End stage renal disease: Secondary | ICD-10-CM | POA: Diagnosis not present

## 2015-03-06 DIAGNOSIS — Z992 Dependence on renal dialysis: Secondary | ICD-10-CM | POA: Diagnosis not present

## 2015-03-08 DIAGNOSIS — D509 Iron deficiency anemia, unspecified: Secondary | ICD-10-CM | POA: Diagnosis not present

## 2015-03-08 DIAGNOSIS — D631 Anemia in chronic kidney disease: Secondary | ICD-10-CM | POA: Diagnosis not present

## 2015-03-08 DIAGNOSIS — Z992 Dependence on renal dialysis: Secondary | ICD-10-CM | POA: Diagnosis not present

## 2015-03-08 DIAGNOSIS — N186 End stage renal disease: Secondary | ICD-10-CM | POA: Diagnosis not present

## 2015-03-10 DIAGNOSIS — D631 Anemia in chronic kidney disease: Secondary | ICD-10-CM | POA: Diagnosis not present

## 2015-03-10 DIAGNOSIS — D509 Iron deficiency anemia, unspecified: Secondary | ICD-10-CM | POA: Diagnosis not present

## 2015-03-10 DIAGNOSIS — N186 End stage renal disease: Secondary | ICD-10-CM | POA: Diagnosis not present

## 2015-03-10 DIAGNOSIS — Z992 Dependence on renal dialysis: Secondary | ICD-10-CM | POA: Diagnosis not present

## 2015-03-13 DIAGNOSIS — D509 Iron deficiency anemia, unspecified: Secondary | ICD-10-CM | POA: Diagnosis not present

## 2015-03-13 DIAGNOSIS — Z992 Dependence on renal dialysis: Secondary | ICD-10-CM | POA: Diagnosis not present

## 2015-03-13 DIAGNOSIS — N186 End stage renal disease: Secondary | ICD-10-CM | POA: Diagnosis not present

## 2015-03-13 DIAGNOSIS — D631 Anemia in chronic kidney disease: Secondary | ICD-10-CM | POA: Diagnosis not present

## 2015-03-15 DIAGNOSIS — D509 Iron deficiency anemia, unspecified: Secondary | ICD-10-CM | POA: Diagnosis not present

## 2015-03-15 DIAGNOSIS — Z992 Dependence on renal dialysis: Secondary | ICD-10-CM | POA: Diagnosis not present

## 2015-03-15 DIAGNOSIS — N186 End stage renal disease: Secondary | ICD-10-CM | POA: Diagnosis not present

## 2015-03-15 DIAGNOSIS — D631 Anemia in chronic kidney disease: Secondary | ICD-10-CM | POA: Diagnosis not present

## 2015-03-17 DIAGNOSIS — N186 End stage renal disease: Secondary | ICD-10-CM | POA: Diagnosis not present

## 2015-03-17 DIAGNOSIS — Z992 Dependence on renal dialysis: Secondary | ICD-10-CM | POA: Diagnosis not present

## 2015-03-17 DIAGNOSIS — D509 Iron deficiency anemia, unspecified: Secondary | ICD-10-CM | POA: Diagnosis not present

## 2015-03-17 DIAGNOSIS — D631 Anemia in chronic kidney disease: Secondary | ICD-10-CM | POA: Diagnosis not present

## 2015-03-20 DIAGNOSIS — D631 Anemia in chronic kidney disease: Secondary | ICD-10-CM | POA: Diagnosis not present

## 2015-03-20 DIAGNOSIS — N186 End stage renal disease: Secondary | ICD-10-CM | POA: Diagnosis not present

## 2015-03-20 DIAGNOSIS — Z992 Dependence on renal dialysis: Secondary | ICD-10-CM | POA: Diagnosis not present

## 2015-03-20 DIAGNOSIS — D509 Iron deficiency anemia, unspecified: Secondary | ICD-10-CM | POA: Diagnosis not present

## 2015-03-22 DIAGNOSIS — N186 End stage renal disease: Secondary | ICD-10-CM | POA: Diagnosis not present

## 2015-03-22 DIAGNOSIS — D631 Anemia in chronic kidney disease: Secondary | ICD-10-CM | POA: Diagnosis not present

## 2015-03-22 DIAGNOSIS — Z992 Dependence on renal dialysis: Secondary | ICD-10-CM | POA: Diagnosis not present

## 2015-03-22 DIAGNOSIS — D509 Iron deficiency anemia, unspecified: Secondary | ICD-10-CM | POA: Diagnosis not present

## 2015-03-24 DIAGNOSIS — D631 Anemia in chronic kidney disease: Secondary | ICD-10-CM | POA: Diagnosis not present

## 2015-03-24 DIAGNOSIS — N186 End stage renal disease: Secondary | ICD-10-CM | POA: Diagnosis not present

## 2015-03-24 DIAGNOSIS — Z992 Dependence on renal dialysis: Secondary | ICD-10-CM | POA: Diagnosis not present

## 2015-03-24 DIAGNOSIS — D509 Iron deficiency anemia, unspecified: Secondary | ICD-10-CM | POA: Diagnosis not present

## 2015-03-27 DIAGNOSIS — N186 End stage renal disease: Secondary | ICD-10-CM | POA: Diagnosis not present

## 2015-03-27 DIAGNOSIS — Z992 Dependence on renal dialysis: Secondary | ICD-10-CM | POA: Diagnosis not present

## 2015-03-27 DIAGNOSIS — D631 Anemia in chronic kidney disease: Secondary | ICD-10-CM | POA: Diagnosis not present

## 2015-03-27 DIAGNOSIS — D509 Iron deficiency anemia, unspecified: Secondary | ICD-10-CM | POA: Diagnosis not present

## 2015-03-29 DIAGNOSIS — D631 Anemia in chronic kidney disease: Secondary | ICD-10-CM | POA: Diagnosis not present

## 2015-03-29 DIAGNOSIS — D509 Iron deficiency anemia, unspecified: Secondary | ICD-10-CM | POA: Diagnosis not present

## 2015-03-29 DIAGNOSIS — N186 End stage renal disease: Secondary | ICD-10-CM | POA: Diagnosis not present

## 2015-03-29 DIAGNOSIS — Z992 Dependence on renal dialysis: Secondary | ICD-10-CM | POA: Diagnosis not present

## 2015-03-30 ENCOUNTER — Encounter: Payer: Self-pay | Admitting: Family Medicine

## 2015-03-30 ENCOUNTER — Ambulatory Visit (INDEPENDENT_AMBULATORY_CARE_PROVIDER_SITE_OTHER): Payer: Medicare Other | Admitting: Family Medicine

## 2015-03-30 VITALS — BP 174/73 | HR 66 | Temp 97.5°F | Ht 62.0 in | Wt 251.4 lb

## 2015-03-30 DIAGNOSIS — Z992 Dependence on renal dialysis: Secondary | ICD-10-CM

## 2015-03-30 DIAGNOSIS — E1122 Type 2 diabetes mellitus with diabetic chronic kidney disease: Secondary | ICD-10-CM | POA: Diagnosis not present

## 2015-03-30 DIAGNOSIS — I151 Hypertension secondary to other renal disorders: Secondary | ICD-10-CM | POA: Diagnosis not present

## 2015-03-30 DIAGNOSIS — N2889 Other specified disorders of kidney and ureter: Secondary | ICD-10-CM | POA: Diagnosis not present

## 2015-03-30 DIAGNOSIS — N186 End stage renal disease: Secondary | ICD-10-CM

## 2015-03-30 DIAGNOSIS — Z794 Long term (current) use of insulin: Secondary | ICD-10-CM

## 2015-03-30 DIAGNOSIS — E119 Type 2 diabetes mellitus without complications: Secondary | ICD-10-CM | POA: Insufficient documentation

## 2015-03-30 DIAGNOSIS — E118 Type 2 diabetes mellitus with unspecified complications: Secondary | ICD-10-CM | POA: Insufficient documentation

## 2015-03-30 LAB — POCT GLYCOSYLATED HEMOGLOBIN (HGB A1C): Hemoglobin A1C: 9.9

## 2015-03-30 MED ORDER — INSULIN GLARGINE 300 UNIT/ML ~~LOC~~ SOPN: 60.0000 [IU] | PEN_INJECTOR | Freq: Every day | SUBCUTANEOUS | Status: DC

## 2015-03-30 MED ORDER — INSULIN GLARGINE 300 UNIT/ML ~~LOC~~ SOPN: 40.0000 [IU] | PEN_INJECTOR | Freq: Every day | SUBCUTANEOUS | Status: DC

## 2015-03-30 NOTE — Progress Notes (Signed)
Subjective:  Patient ID: Christy Zhang, female    DOB: 01-Feb-1956  Age: 59 y.o. MRN: [id number]  CC: Establish Care   HPI [facility] presents for diabetes. Not checking glucose at home she is also a end-stage renal disease patient she undergoes dialysis 3 days a week under the supervision of Dr.Befekadu  in [place]. The renal disease was brought on apparently by uncontrolled hypertension. She also has a history of asthma and arthritis. The arthritis has affected multiple joints in the past. For that she takes acetaminophen currently. It has been severe enough that she was on oxycodone in the past. The combination of asthma and congestive heart failure has led to the need for the use of oxygen at times. However she currently denies shortness of breath and does not have an active prescription for an inhaler.  She is insulin-dependent for her diabetes. She does not check her blood sugar at home. She has not had a recent hemoglobin A1c. Her elevated blood pressure is managed by Dr. Lowanda Foster through the dialysis unit. She has Lasix for use as needed due to a history of congestive heart failure. This was also brought on by her history of hypertension. She does not have a known history of coronary disease.  History Kinda has a past medical history of Hypertension; Anemia; CHF (congestive heart failure) ([place]); Chronic kidney disease; Asthma; Diabetes mellitus without complication ([place]); Arthritis; and Neuromuscular disorder ([place]).   She has past surgical history that includes Insertion of dialysis catheter; Tubal ligation; AV fistula placement (Right, 08/25/2014); Fistula superficialization (Right, 11/03/2014); and Ligation of competing branches of arteriovenous fistula (Right, 11/03/2014).   Her family history includes Cancer in her father; Diabetes in her daughter, father, mother, and son; Heart disease in her daughter and son; Hypertension in her daughter, mother, sister, and son; Peripheral  vascular disease in her son.She reports that she quit smoking about 30 years ago. Her smoking use included Cigarettes. She has never used smokeless tobacco. She reports that she does not drink alcohol or use illicit drugs.  Current Outpatient Prescriptions on File Prior to Visit  Medication Sig Dispense Refill  . acetaminophen (TYLENOL) 500 MG tablet Take 500 mg by mouth every 8 (eight) hours as needed for mild pain.    Marland Kitchen amLODipine (NORVASC) 10 MG tablet Take 10 mg by mouth daily.    . diphenhydrAMINE (BENADRYL) 25 mg capsule Take 25 mg by mouth every 8 (eight) hours as needed for itching.    . ergocalciferol (VITAMIN D2) 50000 UNITS capsule Take 50,000 Units by mouth once a week.    . furosemide (LASIX) 40 MG tablet Take 40 mg by mouth daily.    . metoprolol succinate (TOPROL-XL) 100 MG 24 hr tablet Take 100 mg by mouth daily. Take with or immediately following a meal.    . sevelamer carbonate (RENVELA) 800 MG tablet Take 800 mg by mouth 3 (three) times daily with meals. Takes 3 pills with meals, takes 2 pills with snacks    . oxyCODONE-acetaminophen (PERCOCET/ROXICET) 5-325 MG per tablet Take 1 tablet by mouth every 6 (six) hours as needed. (Patient not taking: Reported on 03/30/2015) 30 tablet 0  . OXYGEN Inhale 2 L into the lungs at bedtime. As needed     Current Facility-Administered Medications on File Prior to Visit  Medication Dose Route Frequency Provider Last Rate Last Dose  . 0.9 %  sodium chloride infusion   Intravenous Continuous Mal Misty, MD  ROS Review of Systems  Constitutional: Negative for fever, chills, diaphoresis, activity change, appetite change, fatigue and unexpected weight change.  HENT: Negative for congestion, ear pain, hearing loss, postnasal drip, rhinorrhea, sneezing, sore throat and trouble swallowing.   Eyes: Negative for pain and visual disturbance.  Respiratory: Negative for cough, chest tightness and shortness of breath.   Cardiovascular:  Positive for leg swelling. Negative for chest pain and palpitations.  Gastrointestinal: Negative for nausea, vomiting, abdominal pain, diarrhea and constipation.  Endocrine: Negative for cold intolerance, heat intolerance, polydipsia, polyphagia and polyuria.  Genitourinary: Negative for dysuria, frequency and menstrual problem. Dyspareunia: is.  Musculoskeletal: Positive for arthralgias. Negative for myalgias and joint swelling.  Skin: Negative for rash.  Allergic/Immunologic: Negative for environmental allergies.  Neurological: Positive for weakness (iffuse based on dialysiis). Negative for dizziness, numbness and headaches.  Psychiatric/Behavioral: Negative for dysphoric mood and agitation.    Objective:  BP 174/73 mmHg  Pulse 66  Temp(Src) 97.5 F (36.4 C) (Oral)  Ht _0  (1.575 m)  Wt 251 lb 6 oz (114.023 kg)  BMI 45.97 kg/m2  SpO2 97%  Physical Exam  Constitutional: She is oriented to person, place, and time. She appears well-developed and well-nourished. No distress.  HENT:  Head: Normocephalic and atraumatic.  Right Ear: External ear normal.  Left Ear: External ear normal.  Nose: Nose normal.  Mouth/Throat: Oropharynx is clear and moist.  Eyes: Conjunctivae and EOM are normal. Pupils are equal, round, and reactive to light.  Neck: Normal range of motion. Neck supple. No thyromegaly present.  Cardiovascular: Normal rate, regular rhythm and normal heart sounds.   No murmur heard. Pulmonary/Chest: Effort normal and breath sounds normal. No respiratory distress. She has no wheezes. She has no rales.  Abdominal: Soft. Bowel sounds are normal. She exhibits no distension. There is no tenderness.  Lymphadenopathy:    She has no cervical adenopathy.  Neurological: She is alert and oriented to person, place, and time. She has normal reflexes.  Skin: Skin is warm and dry.  Psychiatric: She has a normal mood and affect. Her behavior is normal. Judgment and thought content normal.      Assessment & Plan:   Christy Zhang was seen today for establish care.  Diagnoses and all orders for this visit:  Type 2 diabetes mellitus with chronic kidney disease on chronic dialysis, with long-term current use of insulin (HCC) -     POCT glycosylated hemoglobin (Hb A1C) -     CMP14+EGFR -     Lipid panel  Hypertension secondary to other renal disorders -     POCT glycosylated hemoglobin (Hb A1C) -     CMP14+EGFR -     Lipid panel  Other orders -     Discontinue: Insulin Glargine (TOUJEO SOLOSTAR) 300 UNIT/ML SOPN; Inject 40 Units into the skin daily. -     Insulin Glargine (TOUJEO SOLOSTAR) 300 UNIT/ML SOPN; Inject 60 Units into the skin daily.   I have discontinued Ms. Gens's guaifenesin and Insulin Glargine. I have also changed her Insulin Glargine. Additionally, I am having her maintain her ergocalciferol, metoprolol succinate, amLODipine, diphenhydrAMINE, acetaminophen, OXYGEN, sevelamer carbonate, furosemide, and oxyCODONE-acetaminophen.  Meds ordered this encounter  Medications  . DISCONTD: Insulin Glargine (TOUJEO SOLOSTAR) 300 UNIT/ML SOPN    Sig: Inject 40 Units into the skin daily.    Dispense:  5 pen    Refill:  11  . Insulin Glargine (TOUJEO SOLOSTAR) 300 UNIT/ML SOPN    Sig: Inject 60 Units into the  skin daily.    Dispense:  6 pen    Refill:  11     Follow-up: Return in about 1 month (around 04/30/2015) for diabetes, hypertension.  Claretta Fraise, M.D.

## 2015-03-31 ENCOUNTER — Other Ambulatory Visit: Payer: Self-pay

## 2015-03-31 ENCOUNTER — Other Ambulatory Visit: Payer: Self-pay | Admitting: Family Medicine

## 2015-03-31 DIAGNOSIS — Z992 Dependence on renal dialysis: Secondary | ICD-10-CM | POA: Diagnosis not present

## 2015-03-31 DIAGNOSIS — D631 Anemia in chronic kidney disease: Secondary | ICD-10-CM | POA: Diagnosis not present

## 2015-03-31 DIAGNOSIS — N186 End stage renal disease: Secondary | ICD-10-CM | POA: Diagnosis not present

## 2015-03-31 DIAGNOSIS — Z23 Encounter for immunization: Secondary | ICD-10-CM | POA: Diagnosis not present

## 2015-03-31 DIAGNOSIS — N2581 Secondary hyperparathyroidism of renal origin: Secondary | ICD-10-CM | POA: Diagnosis not present

## 2015-03-31 DIAGNOSIS — D509 Iron deficiency anemia, unspecified: Secondary | ICD-10-CM | POA: Diagnosis not present

## 2015-03-31 LAB — CMP14+EGFR
ALT: 12 IU/L (ref 0–32)
AST: 16 IU/L (ref 0–40)
Albumin/Globulin Ratio: 1.1 (ref 1.1–2.5)
Albumin: 4.2 g/dL (ref 3.5–5.5)
Alkaline Phosphatase: 114 IU/L (ref 39–117)
BUN/Creatinine Ratio: 6 — ABNORMAL LOW (ref 9–23)
BUN: 34 mg/dL — ABNORMAL HIGH (ref 6–24)
Bilirubin Total: 0.4 mg/dL (ref 0.0–1.2)
CO2: 25 mmol/L (ref 18–29)
Calcium: 9.7 mg/dL (ref 8.7–10.2)
Chloride: 94 mmol/L — ABNORMAL LOW (ref 97–106)
Creatinine, Ser: 5.61 mg/dL (ref 0.57–1.00)
GFR calc Af Amer: 9 mL/min/{1.73_m2} — ABNORMAL LOW (ref >59)
GFR calc non Af Amer: 8 mL/min/{1.73_m2} — ABNORMAL LOW (ref >59)
Globulin, Total: 3.7 g/dL (ref 1.5–4.5)
Glucose: 70 mg/dL (ref 65–99)
Potassium: 4.2 mmol/L (ref 3.5–5.2)
Sodium: 139 mmol/L (ref 136–144)
Total Protein: 7.9 g/dL (ref 6.0–8.5)

## 2015-03-31 LAB — LIPID PANEL
Chol/HDL Ratio: 4.2 ratio units (ref 0.0–4.4)
Cholesterol, Total: 220 mg/dL — ABNORMAL HIGH (ref 100–199)
HDL: 52 mg/dL (ref >39)
LDL Calculated: 130 mg/dL — ABNORMAL HIGH (ref 0–99)
Triglycerides: 191 mg/dL — ABNORMAL HIGH (ref 0–149)
VLDL Cholesterol Cal: 38 mg/dL (ref 5–40)

## 2015-03-31 MED ORDER — ATORVASTATIN CALCIUM 20 MG PO TABS: 20.0000 mg | ORAL_TABLET | Freq: Every day | ORAL | Status: DC

## 2015-04-03 DIAGNOSIS — N186 End stage renal disease: Secondary | ICD-10-CM | POA: Diagnosis not present

## 2015-04-03 DIAGNOSIS — D631 Anemia in chronic kidney disease: Secondary | ICD-10-CM | POA: Diagnosis not present

## 2015-04-03 DIAGNOSIS — Z23 Encounter for immunization: Secondary | ICD-10-CM | POA: Diagnosis not present

## 2015-04-03 DIAGNOSIS — D509 Iron deficiency anemia, unspecified: Secondary | ICD-10-CM | POA: Diagnosis not present

## 2015-04-03 DIAGNOSIS — Z992 Dependence on renal dialysis: Secondary | ICD-10-CM | POA: Diagnosis not present

## 2015-04-03 DIAGNOSIS — N2581 Secondary hyperparathyroidism of renal origin: Secondary | ICD-10-CM | POA: Diagnosis not present

## 2015-04-03 NOTE — Progress Notes (Signed)
Patient aware, will pick up rx

## 2015-04-05 DIAGNOSIS — D509 Iron deficiency anemia, unspecified: Secondary | ICD-10-CM | POA: Diagnosis not present

## 2015-04-05 DIAGNOSIS — Z23 Encounter for immunization: Secondary | ICD-10-CM | POA: Diagnosis not present

## 2015-04-05 DIAGNOSIS — Z992 Dependence on renal dialysis: Secondary | ICD-10-CM | POA: Diagnosis not present

## 2015-04-05 DIAGNOSIS — D631 Anemia in chronic kidney disease: Secondary | ICD-10-CM | POA: Diagnosis not present

## 2015-04-05 DIAGNOSIS — N186 End stage renal disease: Secondary | ICD-10-CM | POA: Diagnosis not present

## 2015-04-05 DIAGNOSIS — N2581 Secondary hyperparathyroidism of renal origin: Secondary | ICD-10-CM | POA: Diagnosis not present

## 2015-04-07 DIAGNOSIS — N186 End stage renal disease: Secondary | ICD-10-CM | POA: Diagnosis not present

## 2015-04-07 DIAGNOSIS — D509 Iron deficiency anemia, unspecified: Secondary | ICD-10-CM | POA: Diagnosis not present

## 2015-04-07 DIAGNOSIS — Z992 Dependence on renal dialysis: Secondary | ICD-10-CM | POA: Diagnosis not present

## 2015-04-07 DIAGNOSIS — D631 Anemia in chronic kidney disease: Secondary | ICD-10-CM | POA: Diagnosis not present

## 2015-04-07 DIAGNOSIS — Z23 Encounter for immunization: Secondary | ICD-10-CM | POA: Diagnosis not present

## 2015-04-07 DIAGNOSIS — N2581 Secondary hyperparathyroidism of renal origin: Secondary | ICD-10-CM | POA: Diagnosis not present

## 2015-04-10 DIAGNOSIS — N2581 Secondary hyperparathyroidism of renal origin: Secondary | ICD-10-CM | POA: Diagnosis not present

## 2015-04-10 DIAGNOSIS — D631 Anemia in chronic kidney disease: Secondary | ICD-10-CM | POA: Diagnosis not present

## 2015-04-10 DIAGNOSIS — D509 Iron deficiency anemia, unspecified: Secondary | ICD-10-CM | POA: Diagnosis not present

## 2015-04-10 DIAGNOSIS — N186 End stage renal disease: Secondary | ICD-10-CM | POA: Diagnosis not present

## 2015-04-10 DIAGNOSIS — Z23 Encounter for immunization: Secondary | ICD-10-CM | POA: Diagnosis not present

## 2015-04-10 DIAGNOSIS — Z992 Dependence on renal dialysis: Secondary | ICD-10-CM | POA: Diagnosis not present

## 2015-04-12 DIAGNOSIS — D631 Anemia in chronic kidney disease: Secondary | ICD-10-CM | POA: Diagnosis not present

## 2015-04-12 DIAGNOSIS — N186 End stage renal disease: Secondary | ICD-10-CM | POA: Diagnosis not present

## 2015-04-12 DIAGNOSIS — D509 Iron deficiency anemia, unspecified: Secondary | ICD-10-CM | POA: Diagnosis not present

## 2015-04-12 DIAGNOSIS — Z992 Dependence on renal dialysis: Secondary | ICD-10-CM | POA: Diagnosis not present

## 2015-04-12 DIAGNOSIS — N2581 Secondary hyperparathyroidism of renal origin: Secondary | ICD-10-CM | POA: Diagnosis not present

## 2015-04-12 DIAGNOSIS — Z23 Encounter for immunization: Secondary | ICD-10-CM | POA: Diagnosis not present

## 2015-04-14 DIAGNOSIS — D509 Iron deficiency anemia, unspecified: Secondary | ICD-10-CM | POA: Diagnosis not present

## 2015-04-14 DIAGNOSIS — N186 End stage renal disease: Secondary | ICD-10-CM | POA: Diagnosis not present

## 2015-04-14 DIAGNOSIS — Z23 Encounter for immunization: Secondary | ICD-10-CM | POA: Diagnosis not present

## 2015-04-14 DIAGNOSIS — D631 Anemia in chronic kidney disease: Secondary | ICD-10-CM | POA: Diagnosis not present

## 2015-04-14 DIAGNOSIS — Z992 Dependence on renal dialysis: Secondary | ICD-10-CM | POA: Diagnosis not present

## 2015-04-14 DIAGNOSIS — N2581 Secondary hyperparathyroidism of renal origin: Secondary | ICD-10-CM | POA: Diagnosis not present

## 2015-04-17 DIAGNOSIS — D631 Anemia in chronic kidney disease: Secondary | ICD-10-CM | POA: Diagnosis not present

## 2015-04-17 DIAGNOSIS — N2581 Secondary hyperparathyroidism of renal origin: Secondary | ICD-10-CM | POA: Diagnosis not present

## 2015-04-17 DIAGNOSIS — N186 End stage renal disease: Secondary | ICD-10-CM | POA: Diagnosis not present

## 2015-04-17 DIAGNOSIS — Z23 Encounter for immunization: Secondary | ICD-10-CM | POA: Diagnosis not present

## 2015-04-17 DIAGNOSIS — D509 Iron deficiency anemia, unspecified: Secondary | ICD-10-CM | POA: Diagnosis not present

## 2015-04-17 DIAGNOSIS — Z992 Dependence on renal dialysis: Secondary | ICD-10-CM | POA: Diagnosis not present

## 2015-04-19 DIAGNOSIS — D631 Anemia in chronic kidney disease: Secondary | ICD-10-CM | POA: Diagnosis not present

## 2015-04-19 DIAGNOSIS — Z992 Dependence on renal dialysis: Secondary | ICD-10-CM | POA: Diagnosis not present

## 2015-04-19 DIAGNOSIS — D509 Iron deficiency anemia, unspecified: Secondary | ICD-10-CM | POA: Diagnosis not present

## 2015-04-19 DIAGNOSIS — Z23 Encounter for immunization: Secondary | ICD-10-CM | POA: Diagnosis not present

## 2015-04-19 DIAGNOSIS — N186 End stage renal disease: Secondary | ICD-10-CM | POA: Diagnosis not present

## 2015-04-19 DIAGNOSIS — N2581 Secondary hyperparathyroidism of renal origin: Secondary | ICD-10-CM | POA: Diagnosis not present

## 2015-04-21 DIAGNOSIS — D631 Anemia in chronic kidney disease: Secondary | ICD-10-CM | POA: Diagnosis not present

## 2015-04-21 DIAGNOSIS — N186 End stage renal disease: Secondary | ICD-10-CM | POA: Diagnosis not present

## 2015-04-21 DIAGNOSIS — Z992 Dependence on renal dialysis: Secondary | ICD-10-CM | POA: Diagnosis not present

## 2015-04-21 DIAGNOSIS — D509 Iron deficiency anemia, unspecified: Secondary | ICD-10-CM | POA: Diagnosis not present

## 2015-04-21 DIAGNOSIS — N2581 Secondary hyperparathyroidism of renal origin: Secondary | ICD-10-CM | POA: Diagnosis not present

## 2015-04-21 DIAGNOSIS — Z23 Encounter for immunization: Secondary | ICD-10-CM | POA: Diagnosis not present

## 2015-04-24 DIAGNOSIS — D509 Iron deficiency anemia, unspecified: Secondary | ICD-10-CM | POA: Diagnosis not present

## 2015-04-24 DIAGNOSIS — N2581 Secondary hyperparathyroidism of renal origin: Secondary | ICD-10-CM | POA: Diagnosis not present

## 2015-04-24 DIAGNOSIS — N186 End stage renal disease: Secondary | ICD-10-CM | POA: Diagnosis not present

## 2015-04-24 DIAGNOSIS — Z992 Dependence on renal dialysis: Secondary | ICD-10-CM | POA: Diagnosis not present

## 2015-04-24 DIAGNOSIS — D631 Anemia in chronic kidney disease: Secondary | ICD-10-CM | POA: Diagnosis not present

## 2015-04-24 DIAGNOSIS — Z23 Encounter for immunization: Secondary | ICD-10-CM | POA: Diagnosis not present

## 2015-04-26 DIAGNOSIS — D509 Iron deficiency anemia, unspecified: Secondary | ICD-10-CM | POA: Diagnosis not present

## 2015-04-26 DIAGNOSIS — D631 Anemia in chronic kidney disease: Secondary | ICD-10-CM | POA: Diagnosis not present

## 2015-04-26 DIAGNOSIS — Z992 Dependence on renal dialysis: Secondary | ICD-10-CM | POA: Diagnosis not present

## 2015-04-26 DIAGNOSIS — N186 End stage renal disease: Secondary | ICD-10-CM | POA: Diagnosis not present

## 2015-04-26 DIAGNOSIS — N2581 Secondary hyperparathyroidism of renal origin: Secondary | ICD-10-CM | POA: Diagnosis not present

## 2015-04-26 DIAGNOSIS — Z23 Encounter for immunization: Secondary | ICD-10-CM | POA: Diagnosis not present

## 2015-04-28 DIAGNOSIS — Z23 Encounter for immunization: Secondary | ICD-10-CM | POA: Diagnosis not present

## 2015-04-28 DIAGNOSIS — D631 Anemia in chronic kidney disease: Secondary | ICD-10-CM | POA: Diagnosis not present

## 2015-04-28 DIAGNOSIS — D509 Iron deficiency anemia, unspecified: Secondary | ICD-10-CM | POA: Diagnosis not present

## 2015-04-28 DIAGNOSIS — N2581 Secondary hyperparathyroidism of renal origin: Secondary | ICD-10-CM | POA: Diagnosis not present

## 2015-04-28 DIAGNOSIS — N186 End stage renal disease: Secondary | ICD-10-CM | POA: Diagnosis not present

## 2015-04-28 DIAGNOSIS — Z992 Dependence on renal dialysis: Secondary | ICD-10-CM | POA: Diagnosis not present

## 2015-04-29 DIAGNOSIS — N186 End stage renal disease: Secondary | ICD-10-CM | POA: Diagnosis not present

## 2015-04-29 DIAGNOSIS — Z992 Dependence on renal dialysis: Secondary | ICD-10-CM | POA: Diagnosis not present

## 2015-05-01 DIAGNOSIS — N2581 Secondary hyperparathyroidism of renal origin: Secondary | ICD-10-CM | POA: Diagnosis not present

## 2015-05-01 DIAGNOSIS — Z23 Encounter for immunization: Secondary | ICD-10-CM | POA: Diagnosis not present

## 2015-05-01 DIAGNOSIS — D509 Iron deficiency anemia, unspecified: Secondary | ICD-10-CM | POA: Diagnosis not present

## 2015-05-01 DIAGNOSIS — Z992 Dependence on renal dialysis: Secondary | ICD-10-CM | POA: Diagnosis not present

## 2015-05-01 DIAGNOSIS — N186 End stage renal disease: Secondary | ICD-10-CM | POA: Diagnosis not present

## 2015-05-01 DIAGNOSIS — D631 Anemia in chronic kidney disease: Secondary | ICD-10-CM | POA: Diagnosis not present

## 2015-05-03 DIAGNOSIS — Z23 Encounter for immunization: Secondary | ICD-10-CM | POA: Diagnosis not present

## 2015-05-03 DIAGNOSIS — N186 End stage renal disease: Secondary | ICD-10-CM | POA: Diagnosis not present

## 2015-05-03 DIAGNOSIS — Z992 Dependence on renal dialysis: Secondary | ICD-10-CM | POA: Diagnosis not present

## 2015-05-03 DIAGNOSIS — D509 Iron deficiency anemia, unspecified: Secondary | ICD-10-CM | POA: Diagnosis not present

## 2015-05-03 DIAGNOSIS — D631 Anemia in chronic kidney disease: Secondary | ICD-10-CM | POA: Diagnosis not present

## 2015-05-03 DIAGNOSIS — N2581 Secondary hyperparathyroidism of renal origin: Secondary | ICD-10-CM | POA: Diagnosis not present

## 2015-05-05 DIAGNOSIS — N186 End stage renal disease: Secondary | ICD-10-CM | POA: Diagnosis not present

## 2015-05-05 DIAGNOSIS — Z23 Encounter for immunization: Secondary | ICD-10-CM | POA: Diagnosis not present

## 2015-05-05 DIAGNOSIS — D509 Iron deficiency anemia, unspecified: Secondary | ICD-10-CM | POA: Diagnosis not present

## 2015-05-05 DIAGNOSIS — N2581 Secondary hyperparathyroidism of renal origin: Secondary | ICD-10-CM | POA: Diagnosis not present

## 2015-05-05 DIAGNOSIS — D631 Anemia in chronic kidney disease: Secondary | ICD-10-CM | POA: Diagnosis not present

## 2015-05-05 DIAGNOSIS — Z992 Dependence on renal dialysis: Secondary | ICD-10-CM | POA: Diagnosis not present

## 2015-05-08 DIAGNOSIS — Z23 Encounter for immunization: Secondary | ICD-10-CM | POA: Diagnosis not present

## 2015-05-08 DIAGNOSIS — N186 End stage renal disease: Secondary | ICD-10-CM | POA: Diagnosis not present

## 2015-05-08 DIAGNOSIS — Z992 Dependence on renal dialysis: Secondary | ICD-10-CM | POA: Diagnosis not present

## 2015-05-08 DIAGNOSIS — N2581 Secondary hyperparathyroidism of renal origin: Secondary | ICD-10-CM | POA: Diagnosis not present

## 2015-05-08 DIAGNOSIS — D509 Iron deficiency anemia, unspecified: Secondary | ICD-10-CM | POA: Diagnosis not present

## 2015-05-08 DIAGNOSIS — D631 Anemia in chronic kidney disease: Secondary | ICD-10-CM | POA: Diagnosis not present

## 2015-05-10 DIAGNOSIS — Z992 Dependence on renal dialysis: Secondary | ICD-10-CM | POA: Diagnosis not present

## 2015-05-10 DIAGNOSIS — D631 Anemia in chronic kidney disease: Secondary | ICD-10-CM | POA: Diagnosis not present

## 2015-05-10 DIAGNOSIS — N186 End stage renal disease: Secondary | ICD-10-CM | POA: Diagnosis not present

## 2015-05-10 DIAGNOSIS — D509 Iron deficiency anemia, unspecified: Secondary | ICD-10-CM | POA: Diagnosis not present

## 2015-05-10 DIAGNOSIS — N2581 Secondary hyperparathyroidism of renal origin: Secondary | ICD-10-CM | POA: Diagnosis not present

## 2015-05-10 DIAGNOSIS — Z23 Encounter for immunization: Secondary | ICD-10-CM | POA: Diagnosis not present

## 2015-05-12 ENCOUNTER — Encounter: Payer: Self-pay | Admitting: Family Medicine

## 2015-05-12 DIAGNOSIS — Z23 Encounter for immunization: Secondary | ICD-10-CM | POA: Diagnosis not present

## 2015-05-12 DIAGNOSIS — Z992 Dependence on renal dialysis: Secondary | ICD-10-CM | POA: Diagnosis not present

## 2015-05-12 DIAGNOSIS — D509 Iron deficiency anemia, unspecified: Secondary | ICD-10-CM | POA: Diagnosis not present

## 2015-05-12 DIAGNOSIS — D631 Anemia in chronic kidney disease: Secondary | ICD-10-CM | POA: Diagnosis not present

## 2015-05-12 DIAGNOSIS — N2581 Secondary hyperparathyroidism of renal origin: Secondary | ICD-10-CM | POA: Diagnosis not present

## 2015-05-12 DIAGNOSIS — N186 End stage renal disease: Secondary | ICD-10-CM | POA: Diagnosis not present

## 2015-05-15 DIAGNOSIS — Z992 Dependence on renal dialysis: Secondary | ICD-10-CM | POA: Diagnosis not present

## 2015-05-15 DIAGNOSIS — N186 End stage renal disease: Secondary | ICD-10-CM | POA: Diagnosis not present

## 2015-05-15 DIAGNOSIS — Z23 Encounter for immunization: Secondary | ICD-10-CM | POA: Diagnosis not present

## 2015-05-15 DIAGNOSIS — D631 Anemia in chronic kidney disease: Secondary | ICD-10-CM | POA: Diagnosis not present

## 2015-05-15 DIAGNOSIS — D509 Iron deficiency anemia, unspecified: Secondary | ICD-10-CM | POA: Diagnosis not present

## 2015-05-15 DIAGNOSIS — N2581 Secondary hyperparathyroidism of renal origin: Secondary | ICD-10-CM | POA: Diagnosis not present

## 2015-05-17 DIAGNOSIS — D631 Anemia in chronic kidney disease: Secondary | ICD-10-CM | POA: Diagnosis not present

## 2015-05-17 DIAGNOSIS — N2581 Secondary hyperparathyroidism of renal origin: Secondary | ICD-10-CM | POA: Diagnosis not present

## 2015-05-17 DIAGNOSIS — D509 Iron deficiency anemia, unspecified: Secondary | ICD-10-CM | POA: Diagnosis not present

## 2015-05-17 DIAGNOSIS — Z992 Dependence on renal dialysis: Secondary | ICD-10-CM | POA: Diagnosis not present

## 2015-05-17 DIAGNOSIS — Z23 Encounter for immunization: Secondary | ICD-10-CM | POA: Diagnosis not present

## 2015-05-17 DIAGNOSIS — N186 End stage renal disease: Secondary | ICD-10-CM | POA: Diagnosis not present

## 2015-05-19 DIAGNOSIS — D631 Anemia in chronic kidney disease: Secondary | ICD-10-CM | POA: Diagnosis not present

## 2015-05-19 DIAGNOSIS — D509 Iron deficiency anemia, unspecified: Secondary | ICD-10-CM | POA: Diagnosis not present

## 2015-05-19 DIAGNOSIS — Z992 Dependence on renal dialysis: Secondary | ICD-10-CM | POA: Diagnosis not present

## 2015-05-19 DIAGNOSIS — N186 End stage renal disease: Secondary | ICD-10-CM | POA: Diagnosis not present

## 2015-05-19 DIAGNOSIS — N2581 Secondary hyperparathyroidism of renal origin: Secondary | ICD-10-CM | POA: Diagnosis not present

## 2015-05-19 DIAGNOSIS — Z23 Encounter for immunization: Secondary | ICD-10-CM | POA: Diagnosis not present

## 2015-05-22 DIAGNOSIS — N2581 Secondary hyperparathyroidism of renal origin: Secondary | ICD-10-CM | POA: Diagnosis not present

## 2015-05-22 DIAGNOSIS — Z23 Encounter for immunization: Secondary | ICD-10-CM | POA: Diagnosis not present

## 2015-05-22 DIAGNOSIS — D631 Anemia in chronic kidney disease: Secondary | ICD-10-CM | POA: Diagnosis not present

## 2015-05-22 DIAGNOSIS — Z992 Dependence on renal dialysis: Secondary | ICD-10-CM | POA: Diagnosis not present

## 2015-05-22 DIAGNOSIS — N186 End stage renal disease: Secondary | ICD-10-CM | POA: Diagnosis not present

## 2015-05-22 DIAGNOSIS — D509 Iron deficiency anemia, unspecified: Secondary | ICD-10-CM | POA: Diagnosis not present

## 2015-05-24 DIAGNOSIS — N186 End stage renal disease: Secondary | ICD-10-CM | POA: Diagnosis not present

## 2015-05-24 DIAGNOSIS — Z992 Dependence on renal dialysis: Secondary | ICD-10-CM | POA: Diagnosis not present

## 2015-05-24 DIAGNOSIS — D509 Iron deficiency anemia, unspecified: Secondary | ICD-10-CM | POA: Diagnosis not present

## 2015-05-24 DIAGNOSIS — Z23 Encounter for immunization: Secondary | ICD-10-CM | POA: Diagnosis not present

## 2015-05-24 DIAGNOSIS — N2581 Secondary hyperparathyroidism of renal origin: Secondary | ICD-10-CM | POA: Diagnosis not present

## 2015-05-24 DIAGNOSIS — D631 Anemia in chronic kidney disease: Secondary | ICD-10-CM | POA: Diagnosis not present

## 2015-05-26 DIAGNOSIS — N186 End stage renal disease: Secondary | ICD-10-CM | POA: Diagnosis not present

## 2015-05-26 DIAGNOSIS — D509 Iron deficiency anemia, unspecified: Secondary | ICD-10-CM | POA: Diagnosis not present

## 2015-05-26 DIAGNOSIS — Z992 Dependence on renal dialysis: Secondary | ICD-10-CM | POA: Diagnosis not present

## 2015-05-26 DIAGNOSIS — N2581 Secondary hyperparathyroidism of renal origin: Secondary | ICD-10-CM | POA: Diagnosis not present

## 2015-05-26 DIAGNOSIS — D631 Anemia in chronic kidney disease: Secondary | ICD-10-CM | POA: Diagnosis not present

## 2015-05-26 DIAGNOSIS — Z23 Encounter for immunization: Secondary | ICD-10-CM | POA: Diagnosis not present

## 2015-05-29 DIAGNOSIS — N186 End stage renal disease: Secondary | ICD-10-CM | POA: Diagnosis not present

## 2015-05-29 DIAGNOSIS — D509 Iron deficiency anemia, unspecified: Secondary | ICD-10-CM | POA: Diagnosis not present

## 2015-05-29 DIAGNOSIS — D631 Anemia in chronic kidney disease: Secondary | ICD-10-CM | POA: Diagnosis not present

## 2015-05-29 DIAGNOSIS — N2581 Secondary hyperparathyroidism of renal origin: Secondary | ICD-10-CM | POA: Diagnosis not present

## 2015-05-29 DIAGNOSIS — Z23 Encounter for immunization: Secondary | ICD-10-CM | POA: Diagnosis not present

## 2015-05-29 DIAGNOSIS — Z992 Dependence on renal dialysis: Secondary | ICD-10-CM | POA: Diagnosis not present

## 2015-05-30 DIAGNOSIS — N186 End stage renal disease: Secondary | ICD-10-CM | POA: Diagnosis not present

## 2015-05-30 DIAGNOSIS — Z992 Dependence on renal dialysis: Secondary | ICD-10-CM | POA: Diagnosis not present

## 2015-05-31 DIAGNOSIS — D631 Anemia in chronic kidney disease: Secondary | ICD-10-CM | POA: Diagnosis not present

## 2015-05-31 DIAGNOSIS — N186 End stage renal disease: Secondary | ICD-10-CM | POA: Diagnosis not present

## 2015-05-31 DIAGNOSIS — Z992 Dependence on renal dialysis: Secondary | ICD-10-CM | POA: Diagnosis not present

## 2015-05-31 DIAGNOSIS — N2581 Secondary hyperparathyroidism of renal origin: Secondary | ICD-10-CM | POA: Diagnosis not present

## 2015-05-31 DIAGNOSIS — Z23 Encounter for immunization: Secondary | ICD-10-CM | POA: Diagnosis not present

## 2015-05-31 DIAGNOSIS — D509 Iron deficiency anemia, unspecified: Secondary | ICD-10-CM | POA: Diagnosis not present

## 2015-06-02 DIAGNOSIS — N2581 Secondary hyperparathyroidism of renal origin: Secondary | ICD-10-CM | POA: Diagnosis not present

## 2015-06-02 DIAGNOSIS — D631 Anemia in chronic kidney disease: Secondary | ICD-10-CM | POA: Diagnosis not present

## 2015-06-02 DIAGNOSIS — Z23 Encounter for immunization: Secondary | ICD-10-CM | POA: Diagnosis not present

## 2015-06-02 DIAGNOSIS — Z992 Dependence on renal dialysis: Secondary | ICD-10-CM | POA: Diagnosis not present

## 2015-06-02 DIAGNOSIS — D509 Iron deficiency anemia, unspecified: Secondary | ICD-10-CM | POA: Diagnosis not present

## 2015-06-02 DIAGNOSIS — N186 End stage renal disease: Secondary | ICD-10-CM | POA: Diagnosis not present

## 2015-06-05 DIAGNOSIS — N186 End stage renal disease: Secondary | ICD-10-CM | POA: Diagnosis not present

## 2015-06-05 DIAGNOSIS — Z992 Dependence on renal dialysis: Secondary | ICD-10-CM | POA: Diagnosis not present

## 2015-06-05 DIAGNOSIS — N2581 Secondary hyperparathyroidism of renal origin: Secondary | ICD-10-CM | POA: Diagnosis not present

## 2015-06-05 DIAGNOSIS — D509 Iron deficiency anemia, unspecified: Secondary | ICD-10-CM | POA: Diagnosis not present

## 2015-06-05 DIAGNOSIS — D631 Anemia in chronic kidney disease: Secondary | ICD-10-CM | POA: Diagnosis not present

## 2015-06-05 DIAGNOSIS — Z23 Encounter for immunization: Secondary | ICD-10-CM | POA: Diagnosis not present

## 2015-06-06 ENCOUNTER — Telehealth: Payer: Self-pay | Admitting: Family Medicine

## 2015-06-07 DIAGNOSIS — N186 End stage renal disease: Secondary | ICD-10-CM | POA: Diagnosis not present

## 2015-06-07 DIAGNOSIS — N2581 Secondary hyperparathyroidism of renal origin: Secondary | ICD-10-CM | POA: Diagnosis not present

## 2015-06-07 DIAGNOSIS — Z23 Encounter for immunization: Secondary | ICD-10-CM | POA: Diagnosis not present

## 2015-06-07 DIAGNOSIS — D631 Anemia in chronic kidney disease: Secondary | ICD-10-CM | POA: Diagnosis not present

## 2015-06-07 DIAGNOSIS — Z992 Dependence on renal dialysis: Secondary | ICD-10-CM | POA: Diagnosis not present

## 2015-06-07 DIAGNOSIS — D509 Iron deficiency anemia, unspecified: Secondary | ICD-10-CM | POA: Diagnosis not present

## 2015-06-09 DIAGNOSIS — Z23 Encounter for immunization: Secondary | ICD-10-CM | POA: Diagnosis not present

## 2015-06-09 DIAGNOSIS — N2581 Secondary hyperparathyroidism of renal origin: Secondary | ICD-10-CM | POA: Diagnosis not present

## 2015-06-09 DIAGNOSIS — N186 End stage renal disease: Secondary | ICD-10-CM | POA: Diagnosis not present

## 2015-06-09 DIAGNOSIS — D509 Iron deficiency anemia, unspecified: Secondary | ICD-10-CM | POA: Diagnosis not present

## 2015-06-09 DIAGNOSIS — Z992 Dependence on renal dialysis: Secondary | ICD-10-CM | POA: Diagnosis not present

## 2015-06-09 DIAGNOSIS — D631 Anemia in chronic kidney disease: Secondary | ICD-10-CM | POA: Diagnosis not present

## 2015-06-12 DIAGNOSIS — N186 End stage renal disease: Secondary | ICD-10-CM | POA: Diagnosis not present

## 2015-06-12 DIAGNOSIS — Z23 Encounter for immunization: Secondary | ICD-10-CM | POA: Diagnosis not present

## 2015-06-12 DIAGNOSIS — D509 Iron deficiency anemia, unspecified: Secondary | ICD-10-CM | POA: Diagnosis not present

## 2015-06-12 DIAGNOSIS — Z992 Dependence on renal dialysis: Secondary | ICD-10-CM | POA: Diagnosis not present

## 2015-06-12 DIAGNOSIS — N2581 Secondary hyperparathyroidism of renal origin: Secondary | ICD-10-CM | POA: Diagnosis not present

## 2015-06-12 DIAGNOSIS — D631 Anemia in chronic kidney disease: Secondary | ICD-10-CM | POA: Diagnosis not present

## 2015-06-14 DIAGNOSIS — N186 End stage renal disease: Secondary | ICD-10-CM | POA: Diagnosis not present

## 2015-06-14 DIAGNOSIS — D509 Iron deficiency anemia, unspecified: Secondary | ICD-10-CM | POA: Diagnosis not present

## 2015-06-14 DIAGNOSIS — D631 Anemia in chronic kidney disease: Secondary | ICD-10-CM | POA: Diagnosis not present

## 2015-06-14 DIAGNOSIS — Z992 Dependence on renal dialysis: Secondary | ICD-10-CM | POA: Diagnosis not present

## 2015-06-14 DIAGNOSIS — N2581 Secondary hyperparathyroidism of renal origin: Secondary | ICD-10-CM | POA: Diagnosis not present

## 2015-06-14 DIAGNOSIS — Z23 Encounter for immunization: Secondary | ICD-10-CM | POA: Diagnosis not present

## 2015-06-16 DIAGNOSIS — Z992 Dependence on renal dialysis: Secondary | ICD-10-CM | POA: Diagnosis not present

## 2015-06-16 DIAGNOSIS — D509 Iron deficiency anemia, unspecified: Secondary | ICD-10-CM | POA: Diagnosis not present

## 2015-06-16 DIAGNOSIS — N186 End stage renal disease: Secondary | ICD-10-CM | POA: Diagnosis not present

## 2015-06-16 DIAGNOSIS — D631 Anemia in chronic kidney disease: Secondary | ICD-10-CM | POA: Diagnosis not present

## 2015-06-16 DIAGNOSIS — Z23 Encounter for immunization: Secondary | ICD-10-CM | POA: Diagnosis not present

## 2015-06-16 DIAGNOSIS — N2581 Secondary hyperparathyroidism of renal origin: Secondary | ICD-10-CM | POA: Diagnosis not present

## 2015-06-19 DIAGNOSIS — D509 Iron deficiency anemia, unspecified: Secondary | ICD-10-CM | POA: Diagnosis not present

## 2015-06-19 DIAGNOSIS — Z992 Dependence on renal dialysis: Secondary | ICD-10-CM | POA: Diagnosis not present

## 2015-06-19 DIAGNOSIS — D631 Anemia in chronic kidney disease: Secondary | ICD-10-CM | POA: Diagnosis not present

## 2015-06-19 DIAGNOSIS — Z23 Encounter for immunization: Secondary | ICD-10-CM | POA: Diagnosis not present

## 2015-06-19 DIAGNOSIS — N2581 Secondary hyperparathyroidism of renal origin: Secondary | ICD-10-CM | POA: Diagnosis not present

## 2015-06-19 DIAGNOSIS — N186 End stage renal disease: Secondary | ICD-10-CM | POA: Diagnosis not present

## 2015-06-21 DIAGNOSIS — D631 Anemia in chronic kidney disease: Secondary | ICD-10-CM | POA: Diagnosis not present

## 2015-06-21 DIAGNOSIS — N186 End stage renal disease: Secondary | ICD-10-CM | POA: Diagnosis not present

## 2015-06-21 DIAGNOSIS — Z23 Encounter for immunization: Secondary | ICD-10-CM | POA: Diagnosis not present

## 2015-06-21 DIAGNOSIS — D509 Iron deficiency anemia, unspecified: Secondary | ICD-10-CM | POA: Diagnosis not present

## 2015-06-21 DIAGNOSIS — Z992 Dependence on renal dialysis: Secondary | ICD-10-CM | POA: Diagnosis not present

## 2015-06-21 DIAGNOSIS — N2581 Secondary hyperparathyroidism of renal origin: Secondary | ICD-10-CM | POA: Diagnosis not present

## 2015-06-23 DIAGNOSIS — D631 Anemia in chronic kidney disease: Secondary | ICD-10-CM | POA: Diagnosis not present

## 2015-06-23 DIAGNOSIS — N186 End stage renal disease: Secondary | ICD-10-CM | POA: Diagnosis not present

## 2015-06-23 DIAGNOSIS — Z992 Dependence on renal dialysis: Secondary | ICD-10-CM | POA: Diagnosis not present

## 2015-06-23 DIAGNOSIS — D509 Iron deficiency anemia, unspecified: Secondary | ICD-10-CM | POA: Diagnosis not present

## 2015-06-23 DIAGNOSIS — N2581 Secondary hyperparathyroidism of renal origin: Secondary | ICD-10-CM | POA: Diagnosis not present

## 2015-06-23 DIAGNOSIS — Z23 Encounter for immunization: Secondary | ICD-10-CM | POA: Diagnosis not present

## 2015-06-26 DIAGNOSIS — N2581 Secondary hyperparathyroidism of renal origin: Secondary | ICD-10-CM | POA: Diagnosis not present

## 2015-06-26 DIAGNOSIS — D631 Anemia in chronic kidney disease: Secondary | ICD-10-CM | POA: Diagnosis not present

## 2015-06-26 DIAGNOSIS — Z23 Encounter for immunization: Secondary | ICD-10-CM | POA: Diagnosis not present

## 2015-06-26 DIAGNOSIS — N186 End stage renal disease: Secondary | ICD-10-CM | POA: Diagnosis not present

## 2015-06-26 DIAGNOSIS — Z992 Dependence on renal dialysis: Secondary | ICD-10-CM | POA: Diagnosis not present

## 2015-06-26 DIAGNOSIS — D509 Iron deficiency anemia, unspecified: Secondary | ICD-10-CM | POA: Diagnosis not present

## 2015-06-27 DIAGNOSIS — Z992 Dependence on renal dialysis: Secondary | ICD-10-CM | POA: Diagnosis not present

## 2015-06-27 DIAGNOSIS — N186 End stage renal disease: Secondary | ICD-10-CM | POA: Diagnosis not present

## 2015-06-28 DIAGNOSIS — Z794 Long term (current) use of insulin: Secondary | ICD-10-CM | POA: Diagnosis not present

## 2015-06-28 DIAGNOSIS — N186 End stage renal disease: Secondary | ICD-10-CM | POA: Diagnosis not present

## 2015-06-28 DIAGNOSIS — D509 Iron deficiency anemia, unspecified: Secondary | ICD-10-CM | POA: Diagnosis not present

## 2015-06-28 DIAGNOSIS — Z992 Dependence on renal dialysis: Secondary | ICD-10-CM | POA: Diagnosis not present

## 2015-06-28 DIAGNOSIS — N2581 Secondary hyperparathyroidism of renal origin: Secondary | ICD-10-CM | POA: Diagnosis not present

## 2015-06-28 DIAGNOSIS — E11649 Type 2 diabetes mellitus with hypoglycemia without coma: Secondary | ICD-10-CM | POA: Diagnosis not present

## 2015-06-28 DIAGNOSIS — E162 Hypoglycemia, unspecified: Secondary | ICD-10-CM | POA: Diagnosis not present

## 2015-06-29 ENCOUNTER — Encounter: Payer: Self-pay | Admitting: *Deleted

## 2015-06-30 DIAGNOSIS — D509 Iron deficiency anemia, unspecified: Secondary | ICD-10-CM | POA: Diagnosis not present

## 2015-06-30 DIAGNOSIS — E11649 Type 2 diabetes mellitus with hypoglycemia without coma: Secondary | ICD-10-CM | POA: Diagnosis not present

## 2015-06-30 DIAGNOSIS — N2581 Secondary hyperparathyroidism of renal origin: Secondary | ICD-10-CM | POA: Diagnosis not present

## 2015-06-30 DIAGNOSIS — N186 End stage renal disease: Secondary | ICD-10-CM | POA: Diagnosis not present

## 2015-06-30 DIAGNOSIS — Z992 Dependence on renal dialysis: Secondary | ICD-10-CM | POA: Diagnosis not present

## 2015-06-30 DIAGNOSIS — E162 Hypoglycemia, unspecified: Secondary | ICD-10-CM | POA: Diagnosis not present

## 2015-07-03 DIAGNOSIS — E162 Hypoglycemia, unspecified: Secondary | ICD-10-CM | POA: Diagnosis not present

## 2015-07-03 DIAGNOSIS — Z992 Dependence on renal dialysis: Secondary | ICD-10-CM | POA: Diagnosis not present

## 2015-07-03 DIAGNOSIS — N186 End stage renal disease: Secondary | ICD-10-CM | POA: Diagnosis not present

## 2015-07-03 DIAGNOSIS — E11649 Type 2 diabetes mellitus with hypoglycemia without coma: Secondary | ICD-10-CM | POA: Diagnosis not present

## 2015-07-03 DIAGNOSIS — D509 Iron deficiency anemia, unspecified: Secondary | ICD-10-CM | POA: Diagnosis not present

## 2015-07-03 DIAGNOSIS — N2581 Secondary hyperparathyroidism of renal origin: Secondary | ICD-10-CM | POA: Diagnosis not present

## 2015-07-05 DIAGNOSIS — D509 Iron deficiency anemia, unspecified: Secondary | ICD-10-CM | POA: Diagnosis not present

## 2015-07-05 DIAGNOSIS — N2581 Secondary hyperparathyroidism of renal origin: Secondary | ICD-10-CM | POA: Diagnosis not present

## 2015-07-05 DIAGNOSIS — E162 Hypoglycemia, unspecified: Secondary | ICD-10-CM | POA: Diagnosis not present

## 2015-07-05 DIAGNOSIS — Z992 Dependence on renal dialysis: Secondary | ICD-10-CM | POA: Diagnosis not present

## 2015-07-05 DIAGNOSIS — N186 End stage renal disease: Secondary | ICD-10-CM | POA: Diagnosis not present

## 2015-07-05 DIAGNOSIS — E11649 Type 2 diabetes mellitus with hypoglycemia without coma: Secondary | ICD-10-CM | POA: Diagnosis not present

## 2015-07-07 DIAGNOSIS — N2581 Secondary hyperparathyroidism of renal origin: Secondary | ICD-10-CM | POA: Diagnosis not present

## 2015-07-07 DIAGNOSIS — E11649 Type 2 diabetes mellitus with hypoglycemia without coma: Secondary | ICD-10-CM | POA: Diagnosis not present

## 2015-07-07 DIAGNOSIS — E162 Hypoglycemia, unspecified: Secondary | ICD-10-CM | POA: Diagnosis not present

## 2015-07-07 DIAGNOSIS — N186 End stage renal disease: Secondary | ICD-10-CM | POA: Diagnosis not present

## 2015-07-07 DIAGNOSIS — Z992 Dependence on renal dialysis: Secondary | ICD-10-CM | POA: Diagnosis not present

## 2015-07-07 DIAGNOSIS — D509 Iron deficiency anemia, unspecified: Secondary | ICD-10-CM | POA: Diagnosis not present

## 2015-07-10 DIAGNOSIS — N2581 Secondary hyperparathyroidism of renal origin: Secondary | ICD-10-CM | POA: Diagnosis not present

## 2015-07-10 DIAGNOSIS — E11649 Type 2 diabetes mellitus with hypoglycemia without coma: Secondary | ICD-10-CM | POA: Diagnosis not present

## 2015-07-10 DIAGNOSIS — Z992 Dependence on renal dialysis: Secondary | ICD-10-CM | POA: Diagnosis not present

## 2015-07-10 DIAGNOSIS — D509 Iron deficiency anemia, unspecified: Secondary | ICD-10-CM | POA: Diagnosis not present

## 2015-07-10 DIAGNOSIS — E162 Hypoglycemia, unspecified: Secondary | ICD-10-CM | POA: Diagnosis not present

## 2015-07-10 DIAGNOSIS — N186 End stage renal disease: Secondary | ICD-10-CM | POA: Diagnosis not present

## 2015-07-12 DIAGNOSIS — N2581 Secondary hyperparathyroidism of renal origin: Secondary | ICD-10-CM | POA: Diagnosis not present

## 2015-07-12 DIAGNOSIS — Z992 Dependence on renal dialysis: Secondary | ICD-10-CM | POA: Diagnosis not present

## 2015-07-12 DIAGNOSIS — N186 End stage renal disease: Secondary | ICD-10-CM | POA: Diagnosis not present

## 2015-07-12 DIAGNOSIS — E162 Hypoglycemia, unspecified: Secondary | ICD-10-CM | POA: Diagnosis not present

## 2015-07-12 DIAGNOSIS — E11649 Type 2 diabetes mellitus with hypoglycemia without coma: Secondary | ICD-10-CM | POA: Diagnosis not present

## 2015-07-12 DIAGNOSIS — D509 Iron deficiency anemia, unspecified: Secondary | ICD-10-CM | POA: Diagnosis not present

## 2015-07-14 DIAGNOSIS — E11649 Type 2 diabetes mellitus with hypoglycemia without coma: Secondary | ICD-10-CM | POA: Diagnosis not present

## 2015-07-14 DIAGNOSIS — D509 Iron deficiency anemia, unspecified: Secondary | ICD-10-CM | POA: Diagnosis not present

## 2015-07-14 DIAGNOSIS — Z992 Dependence on renal dialysis: Secondary | ICD-10-CM | POA: Diagnosis not present

## 2015-07-14 DIAGNOSIS — N2581 Secondary hyperparathyroidism of renal origin: Secondary | ICD-10-CM | POA: Diagnosis not present

## 2015-07-14 DIAGNOSIS — E162 Hypoglycemia, unspecified: Secondary | ICD-10-CM | POA: Diagnosis not present

## 2015-07-14 DIAGNOSIS — N186 End stage renal disease: Secondary | ICD-10-CM | POA: Diagnosis not present

## 2015-07-17 DIAGNOSIS — E11649 Type 2 diabetes mellitus with hypoglycemia without coma: Secondary | ICD-10-CM | POA: Diagnosis not present

## 2015-07-17 DIAGNOSIS — N2581 Secondary hyperparathyroidism of renal origin: Secondary | ICD-10-CM | POA: Diagnosis not present

## 2015-07-17 DIAGNOSIS — Z992 Dependence on renal dialysis: Secondary | ICD-10-CM | POA: Diagnosis not present

## 2015-07-17 DIAGNOSIS — E162 Hypoglycemia, unspecified: Secondary | ICD-10-CM | POA: Diagnosis not present

## 2015-07-17 DIAGNOSIS — N186 End stage renal disease: Secondary | ICD-10-CM | POA: Diagnosis not present

## 2015-07-17 DIAGNOSIS — D509 Iron deficiency anemia, unspecified: Secondary | ICD-10-CM | POA: Diagnosis not present

## 2015-07-19 DIAGNOSIS — D509 Iron deficiency anemia, unspecified: Secondary | ICD-10-CM | POA: Diagnosis not present

## 2015-07-19 DIAGNOSIS — Z992 Dependence on renal dialysis: Secondary | ICD-10-CM | POA: Diagnosis not present

## 2015-07-19 DIAGNOSIS — E11649 Type 2 diabetes mellitus with hypoglycemia without coma: Secondary | ICD-10-CM | POA: Diagnosis not present

## 2015-07-19 DIAGNOSIS — N186 End stage renal disease: Secondary | ICD-10-CM | POA: Diagnosis not present

## 2015-07-19 DIAGNOSIS — E162 Hypoglycemia, unspecified: Secondary | ICD-10-CM | POA: Diagnosis not present

## 2015-07-19 DIAGNOSIS — N2581 Secondary hyperparathyroidism of renal origin: Secondary | ICD-10-CM | POA: Diagnosis not present

## 2015-07-21 DIAGNOSIS — E162 Hypoglycemia, unspecified: Secondary | ICD-10-CM | POA: Diagnosis not present

## 2015-07-21 DIAGNOSIS — N2581 Secondary hyperparathyroidism of renal origin: Secondary | ICD-10-CM | POA: Diagnosis not present

## 2015-07-21 DIAGNOSIS — D509 Iron deficiency anemia, unspecified: Secondary | ICD-10-CM | POA: Diagnosis not present

## 2015-07-21 DIAGNOSIS — N186 End stage renal disease: Secondary | ICD-10-CM | POA: Diagnosis not present

## 2015-07-21 DIAGNOSIS — E11649 Type 2 diabetes mellitus with hypoglycemia without coma: Secondary | ICD-10-CM | POA: Diagnosis not present

## 2015-07-21 DIAGNOSIS — Z992 Dependence on renal dialysis: Secondary | ICD-10-CM | POA: Diagnosis not present

## 2015-07-24 DIAGNOSIS — E162 Hypoglycemia, unspecified: Secondary | ICD-10-CM | POA: Diagnosis not present

## 2015-07-24 DIAGNOSIS — E11649 Type 2 diabetes mellitus with hypoglycemia without coma: Secondary | ICD-10-CM | POA: Diagnosis not present

## 2015-07-24 DIAGNOSIS — Z992 Dependence on renal dialysis: Secondary | ICD-10-CM | POA: Diagnosis not present

## 2015-07-24 DIAGNOSIS — D509 Iron deficiency anemia, unspecified: Secondary | ICD-10-CM | POA: Diagnosis not present

## 2015-07-24 DIAGNOSIS — E119 Type 2 diabetes mellitus without complications: Secondary | ICD-10-CM | POA: Diagnosis not present

## 2015-07-24 DIAGNOSIS — N2581 Secondary hyperparathyroidism of renal origin: Secondary | ICD-10-CM | POA: Diagnosis not present

## 2015-07-24 DIAGNOSIS — N186 End stage renal disease: Secondary | ICD-10-CM | POA: Diagnosis not present

## 2015-07-24 DIAGNOSIS — Z794 Long term (current) use of insulin: Secondary | ICD-10-CM | POA: Diagnosis not present

## 2015-07-25 ENCOUNTER — Encounter (INDEPENDENT_AMBULATORY_CARE_PROVIDER_SITE_OTHER): Payer: Self-pay

## 2015-07-25 ENCOUNTER — Encounter: Payer: Self-pay | Admitting: Family Medicine

## 2015-07-25 ENCOUNTER — Ambulatory Visit (INDEPENDENT_AMBULATORY_CARE_PROVIDER_SITE_OTHER): Payer: Medicare Other | Admitting: Family Medicine

## 2015-07-25 ENCOUNTER — Ambulatory Visit: Payer: Medicare Other | Admitting: Family Medicine

## 2015-07-25 VITALS — BP 149/66 | HR 85 | Temp 97.7°F | Ht 62.0 in | Wt 251.0 lb

## 2015-07-25 DIAGNOSIS — N2889 Other specified disorders of kidney and ureter: Secondary | ICD-10-CM | POA: Diagnosis not present

## 2015-07-25 DIAGNOSIS — I151 Hypertension secondary to other renal disorders: Secondary | ICD-10-CM | POA: Diagnosis not present

## 2015-07-25 DIAGNOSIS — J4530 Mild persistent asthma, uncomplicated: Secondary | ICD-10-CM | POA: Insufficient documentation

## 2015-07-25 DIAGNOSIS — Z794 Long term (current) use of insulin: Secondary | ICD-10-CM

## 2015-07-25 DIAGNOSIS — N186 End stage renal disease: Secondary | ICD-10-CM

## 2015-07-25 DIAGNOSIS — E1122 Type 2 diabetes mellitus with diabetic chronic kidney disease: Secondary | ICD-10-CM

## 2015-07-25 DIAGNOSIS — Z992 Dependence on renal dialysis: Secondary | ICD-10-CM | POA: Diagnosis not present

## 2015-07-25 NOTE — Progress Notes (Signed)
Subjective:  Patient ID: Christy Zhang, female    DOB: [birth year]  Age: 60 y.o. MRN: [id number]  CC: Diabetes; Hyperlipidemia; Hypertension; and Chronic Kidney Disease   HPI  Christy Zhang presents for  follow-up of hypertension. Patient has DIALYSIS Patient also  in for follow-up of elevated cholesterol. Doing well without complaints on current medication. Denies side effects of statin including myalgia and arthralgia and nausea. Also in today for liver function testing. Currently no chest pain, shortness of breath or other cardiovascular related symptoms noted.  Follow-up of diabetes. Patient does check blood sugar at home. Readings run between 200 and 300 Patient denies symptoms such as polyuria, polydipsia, excessive hunger, nausea No significant hypoglycemic spells noted. Medications as noted below. Taking them regularly without complication/adverse reaction being reported today.    History Christy Zhang has a past medical history of Hypertension; Anemia; CHF (congestive heart failure) ([place]); Chronic kidney disease; Asthma; Diabetes mellitus with complication ([place]); Arthritis; Neuromuscular disorder ([place]); and End stage renal disease ([place]).   She has past surgical history that includes Insertion of dialysis catheter; Tubal ligation; AV fistula placement (Right, 08/25/2014); Fistula superficialization (Right, 11/03/2014); and Ligation of competing branches of arteriovenous fistula (Right, 11/03/2014).   Her family history includes Cancer in her father; Diabetes in her daughter, father, mother, and son; Heart disease in her daughter and son; Hypertension in her daughter, mother, sister, and son; Peripheral vascular disease in her son.She reports that she quit smoking about 31 years ago. Her smoking use included Cigarettes. She has never used smokeless tobacco. She reports that she does not drink alcohol or use illicit drugs.  Current Outpatient Prescriptions on File Prior to Visit  Medication  Sig Dispense Refill  . acetaminophen (TYLENOL) 500 MG tablet Take 500 mg by mouth every 8 (eight) hours as needed for mild pain.    Marland Kitchen amLODipine (NORVASC) 10 MG tablet Take 10 mg by mouth daily.    Marland Kitchen atorvastatin (LIPITOR) 20 MG tablet Take 1 tablet (20 mg total) by mouth daily. 90 tablet 1  . diphenhydrAMINE (BENADRYL) 25 mg capsule Take 25 mg by mouth every 8 (eight) hours as needed for itching.    . ergocalciferol (VITAMIN D2) 50000 UNITS capsule Take 50,000 Units by mouth once a week.    . furosemide (LASIX) 40 MG tablet Take 40 mg by mouth daily.    . Insulin Glargine (TOUJEO SOLOSTAR) 300 UNIT/ML SOPN Inject 60 Units into the skin daily. 6 pen 11  . metoprolol succinate (TOPROL-XL) 100 MG 24 hr tablet Take 100 mg by mouth daily. Take with or immediately following a meal.    . OXYGEN Inhale 2 L into the lungs at bedtime. As needed    . sevelamer carbonate (RENVELA) 800 MG tablet Take 800 mg by mouth 3 (three) times daily with meals. Takes 3 pills with meals, takes 2 pills with snacks     Current Facility-Administered Medications on File Prior to Visit  Medication Dose Route Frequency Provider Last Rate Last Dose  . 0.9 %  sodium chloride infusion   Intravenous Continuous Mal Misty, MD        ROS Review of Systems  Constitutional: Negative for fever, activity change and appetite change.  HENT: Negative for congestion, rhinorrhea and sore throat.   Eyes: Negative for visual disturbance.  Respiratory: Negative for cough and shortness of breath.   Cardiovascular: Negative for chest pain and palpitations.  Gastrointestinal: Negative for nausea, abdominal pain and diarrhea.  Genitourinary: Negative for dysuria.  Musculoskeletal: Negative for myalgias and arthralgias.    Objective:  BP 149/66 mmHg  Pulse 85  Temp(Src) 97.7 F (36.5 C) (Oral)  Ht _0  (1.575 m)  Wt 251 lb (113.853 kg)  BMI 45.90 kg/m2  SpO2 94%  BP Readings from Last 3 Encounters:  07/25/15 149/66    03/30/15 174/73  11/29/14 220/110    Wt Readings from Last 3 Encounters:  07/25/15 251 lb (113.853 kg)  03/30/15 251 lb 6 oz (114.023 kg)  11/29/14 248 lb (112.492 kg)     Physical Exam  Constitutional: She is oriented to person, place, and time. She appears well-developed and well-nourished. No distress.  HENT:  Head: Normocephalic and atraumatic.  Right Ear: External ear normal.  Left Ear: External ear normal.  Nose: Nose normal.  Mouth/Throat: Oropharynx is clear and moist.  Eyes: Conjunctivae and EOM are normal. Pupils are equal, round, and reactive to light.  Neck: Normal range of motion. Neck supple. No thyromegaly present.  Cardiovascular: Normal rate, regular rhythm and normal heart sounds.   No murmur heard. Pulmonary/Chest: Effort normal and breath sounds normal. No respiratory distress. She has no wheezes. She has no rales.  Abdominal: Soft. Bowel sounds are normal. She exhibits no distension. There is no tenderness.  Lymphadenopathy:    She has no cervical adenopathy.  Neurological: She is alert and oriented to person, place, and time. She has normal reflexes.  Skin: Skin is warm and dry.  Psychiatric: She has a normal mood and affect. Her behavior is normal. Judgment and thought content normal.    Lab Results  Component Value Date   HGBA1C 9.9 03/30/2015    Lab Results  Component Value Date   HGB 13.3 11/03/2014   HCT 39.0 11/03/2014   GLUCOSE 70 03/30/2015   CHOL 220* 03/30/2015   TRIG 191* 03/30/2015   HDL 52 03/30/2015   LDLCALC 130* 03/30/2015   ALT 12 03/30/2015   AST 16 03/30/2015   NA 139 03/30/2015   K 4.2 03/30/2015   CL 94* 03/30/2015   CREATININE 5.61* 03/30/2015   BUN 34* 03/30/2015   CO2 25 03/30/2015   HGBA1C 9.9 03/30/2015    Ir Removal Tun Cv Cath W/o Fl  12/20/2014  CLINICAL DATA:  Functioning right upper extremity AV fistula, catheter access no longer required EXAM: RIGHT INTERNAL JUGULARTUNNELED HEMODIAYLISIS CATHER REMOVAL  Date:  8/23/20168/23/2016 9:08 am Radiologist:  M. Daryll Brod, MD MEDICATIONS AND MEDICAL HISTORY: NONE. COMPLICATIONS: NONE IMMEDIATE PROCEDURE: Informed consent was obtained from the patient following explanation of the procedure, risks, benefits and alternatives. The patient understands, agrees and consents for the procedure. All questions were addressed. A time out was performed. Maximal barrier sterile technique utilized including caps, mask, sterile gowns, sterile gloves, large sterile drape, hand hygiene, and ChloraPrep. Under sterile conditions, The right internal jugulartunneled catheter was removed utilizing firm retraction to release the retention cuff. The catheter was removed entirely. Hemostasis was obtained with compression. No immediate complication. Sterile dressing applied. The patient tolerated the procedure well. IMPRESSION: Successful right internal jugulartunneled dialysis catheter removal. Electronically Signed   By: Jerilynn Mages.  Shick M.D.   On: 12/20/2014 09:38    Assessment & Plan:   Christy Zhang was seen today for diabetes, hyperlipidemia, hypertension and chronic kidney disease.  Diagnoses and all orders for this visit:  Type 2 diabetes mellitus with chronic kidney disease on chronic dialysis, with long-term current use of insulin (HCC) -     POCT glycosylated hemoglobin (Hb A1C) -  CMP14+EGFR -     Lipid panel -     CBC with Differential/Platelet -     Bayer DCA Hb A1c Waived; Standing  Asthma, mild persistent, uncomplicated -     Pulse oximetry, overnight; Future -     PR BREATHING CAPACITY TEST -     POCT glycosylated hemoglobin (Hb A1C) -     CMP14+EGFR -     Lipid panel -     CBC with Differential/Platelet  Hypertension secondary to other renal disorders -     POCT glycosylated hemoglobin (Hb A1C) -     CMP14+EGFR -     Lipid panel -     CBC with Differential/Platelet   I have discontinued Christy Zhang's oxyCODONE-acetaminophen. I am also having her maintain her  ergocalciferol, metoprolol succinate, amLODipine, diphenhydrAMINE, acetaminophen, OXYGEN, sevelamer carbonate, furosemide, Insulin Glargine, and atorvastatin.  No orders of the defined types were placed in this encounter.     Follow-up: Return in about 3 months (around 10/25/2015).  Claretta Fraise, M.D.

## 2015-07-26 ENCOUNTER — Other Ambulatory Visit: Payer: Self-pay | Admitting: Family Medicine

## 2015-07-26 DIAGNOSIS — N2581 Secondary hyperparathyroidism of renal origin: Secondary | ICD-10-CM | POA: Diagnosis not present

## 2015-07-26 DIAGNOSIS — N186 End stage renal disease: Secondary | ICD-10-CM | POA: Diagnosis not present

## 2015-07-26 DIAGNOSIS — Z992 Dependence on renal dialysis: Secondary | ICD-10-CM | POA: Diagnosis not present

## 2015-07-26 DIAGNOSIS — D509 Iron deficiency anemia, unspecified: Secondary | ICD-10-CM | POA: Diagnosis not present

## 2015-07-26 DIAGNOSIS — E11649 Type 2 diabetes mellitus with hypoglycemia without coma: Secondary | ICD-10-CM | POA: Diagnosis not present

## 2015-07-26 DIAGNOSIS — E162 Hypoglycemia, unspecified: Secondary | ICD-10-CM | POA: Diagnosis not present

## 2015-07-26 LAB — CBC WITH DIFFERENTIAL/PLATELET
Basophils Absolute: 0 10*3/uL (ref 0.0–0.2)
Basos: 0 %
EOS (ABSOLUTE): 0.3 10*3/uL (ref 0.0–0.4)
Eos: 3 %
Hematocrit: 35.5 % (ref 34.0–46.6)
Hemoglobin: 11.8 g/dL (ref 11.1–15.9)
Immature Grans (Abs): 0 10*3/uL (ref 0.0–0.1)
Immature Granulocytes: 0 %
Lymphocytes Absolute: 2.1 10*3/uL (ref 0.7–3.1)
Lymphs: 21 %
MCH: 27.8 pg (ref 26.6–33.0)
MCHC: 33.2 g/dL (ref 31.5–35.7)
MCV: 84 fL (ref 79–97)
Monocytes Absolute: 0.5 10*3/uL (ref 0.1–0.9)
Monocytes: 5 %
Neutrophils Absolute: 7 10*3/uL (ref 1.4–7.0)
Neutrophils: 71 %
Platelets: 507 10*3/uL — ABNORMAL HIGH (ref 150–379)
RBC: 4.24 x10E6/uL (ref 3.77–5.28)
RDW: 14.2 % (ref 12.3–15.4)
WBC: 10 10*3/uL (ref 3.4–10.8)

## 2015-07-26 LAB — LIPID PANEL
Chol/HDL Ratio: 4.8 ratio units — ABNORMAL HIGH (ref 0.0–4.4)
Cholesterol, Total: 213 mg/dL — ABNORMAL HIGH (ref 100–199)
HDL: 44 mg/dL (ref >39)
LDL Calculated: 128 mg/dL — ABNORMAL HIGH (ref 0–99)
Triglycerides: 206 mg/dL — ABNORMAL HIGH (ref 0–149)
VLDL Cholesterol Cal: 41 mg/dL — ABNORMAL HIGH (ref 5–40)

## 2015-07-26 LAB — CMP14+EGFR
ALT: 10 IU/L (ref 0–32)
AST: 14 IU/L (ref 0–40)
Albumin/Globulin Ratio: 1.1 — ABNORMAL LOW (ref 1.2–2.2)
Albumin: 4.2 g/dL (ref 3.5–5.5)
Alkaline Phosphatase: 107 IU/L (ref 39–117)
BUN/Creatinine Ratio: 4 — ABNORMAL LOW (ref 9–23)
BUN: 24 mg/dL (ref 6–24)
Bilirubin Total: 0.4 mg/dL (ref 0.0–1.2)
CO2: 23 mmol/L (ref 18–29)
Calcium: 9.4 mg/dL (ref 8.7–10.2)
Chloride: 90 mmol/L — ABNORMAL LOW (ref 96–106)
Creatinine, Ser: 5.36 mg/dL (ref 0.57–1.00)
GFR calc Af Amer: 9 mL/min/{1.73_m2} — ABNORMAL LOW (ref >59)
GFR calc non Af Amer: 8 mL/min/{1.73_m2} — ABNORMAL LOW (ref >59)
Globulin, Total: 3.8 g/dL (ref 1.5–4.5)
Glucose: 228 mg/dL — ABNORMAL HIGH (ref 65–99)
Potassium: 4.8 mmol/L (ref 3.5–5.2)
Sodium: 138 mmol/L (ref 134–144)
Total Protein: 8 g/dL (ref 6.0–8.5)

## 2015-07-26 MED ORDER — INSULIN GLARGINE 300 UNIT/ML ~~LOC~~ SOPN: 80.0000 [IU] | PEN_INJECTOR | Freq: Every day | SUBCUTANEOUS | Status: DC

## 2015-07-26 MED ORDER — ATORVASTATIN CALCIUM 40 MG PO TABS: 40.0000 mg | ORAL_TABLET | Freq: Every day | ORAL | Status: DC

## 2015-07-28 DIAGNOSIS — Z992 Dependence on renal dialysis: Secondary | ICD-10-CM | POA: Diagnosis not present

## 2015-07-28 DIAGNOSIS — N186 End stage renal disease: Secondary | ICD-10-CM | POA: Diagnosis not present

## 2015-07-28 DIAGNOSIS — E162 Hypoglycemia, unspecified: Secondary | ICD-10-CM | POA: Diagnosis not present

## 2015-07-28 DIAGNOSIS — D509 Iron deficiency anemia, unspecified: Secondary | ICD-10-CM | POA: Diagnosis not present

## 2015-07-28 DIAGNOSIS — N2581 Secondary hyperparathyroidism of renal origin: Secondary | ICD-10-CM | POA: Diagnosis not present

## 2015-07-28 DIAGNOSIS — E11649 Type 2 diabetes mellitus with hypoglycemia without coma: Secondary | ICD-10-CM | POA: Diagnosis not present

## 2015-07-31 DIAGNOSIS — Z992 Dependence on renal dialysis: Secondary | ICD-10-CM | POA: Diagnosis not present

## 2015-07-31 DIAGNOSIS — D631 Anemia in chronic kidney disease: Secondary | ICD-10-CM | POA: Diagnosis not present

## 2015-07-31 DIAGNOSIS — N186 End stage renal disease: Secondary | ICD-10-CM | POA: Diagnosis not present

## 2015-07-31 DIAGNOSIS — D509 Iron deficiency anemia, unspecified: Secondary | ICD-10-CM | POA: Diagnosis not present

## 2015-07-31 DIAGNOSIS — N2581 Secondary hyperparathyroidism of renal origin: Secondary | ICD-10-CM | POA: Diagnosis not present

## 2015-08-02 DIAGNOSIS — D631 Anemia in chronic kidney disease: Secondary | ICD-10-CM | POA: Diagnosis not present

## 2015-08-02 DIAGNOSIS — N186 End stage renal disease: Secondary | ICD-10-CM | POA: Diagnosis not present

## 2015-08-02 DIAGNOSIS — D509 Iron deficiency anemia, unspecified: Secondary | ICD-10-CM | POA: Diagnosis not present

## 2015-08-02 DIAGNOSIS — Z992 Dependence on renal dialysis: Secondary | ICD-10-CM | POA: Diagnosis not present

## 2015-08-02 DIAGNOSIS — N2581 Secondary hyperparathyroidism of renal origin: Secondary | ICD-10-CM | POA: Diagnosis not present

## 2015-08-04 DIAGNOSIS — N186 End stage renal disease: Secondary | ICD-10-CM | POA: Diagnosis not present

## 2015-08-04 DIAGNOSIS — N2581 Secondary hyperparathyroidism of renal origin: Secondary | ICD-10-CM | POA: Diagnosis not present

## 2015-08-04 DIAGNOSIS — D509 Iron deficiency anemia, unspecified: Secondary | ICD-10-CM | POA: Diagnosis not present

## 2015-08-04 DIAGNOSIS — Z992 Dependence on renal dialysis: Secondary | ICD-10-CM | POA: Diagnosis not present

## 2015-08-04 DIAGNOSIS — D631 Anemia in chronic kidney disease: Secondary | ICD-10-CM | POA: Diagnosis not present

## 2015-08-07 DIAGNOSIS — Z992 Dependence on renal dialysis: Secondary | ICD-10-CM | POA: Diagnosis not present

## 2015-08-07 DIAGNOSIS — N2581 Secondary hyperparathyroidism of renal origin: Secondary | ICD-10-CM | POA: Diagnosis not present

## 2015-08-07 DIAGNOSIS — D509 Iron deficiency anemia, unspecified: Secondary | ICD-10-CM | POA: Diagnosis not present

## 2015-08-07 DIAGNOSIS — D631 Anemia in chronic kidney disease: Secondary | ICD-10-CM | POA: Diagnosis not present

## 2015-08-07 DIAGNOSIS — N186 End stage renal disease: Secondary | ICD-10-CM | POA: Diagnosis not present

## 2015-08-09 DIAGNOSIS — D509 Iron deficiency anemia, unspecified: Secondary | ICD-10-CM | POA: Diagnosis not present

## 2015-08-09 DIAGNOSIS — N186 End stage renal disease: Secondary | ICD-10-CM | POA: Diagnosis not present

## 2015-08-09 DIAGNOSIS — Z992 Dependence on renal dialysis: Secondary | ICD-10-CM | POA: Diagnosis not present

## 2015-08-09 DIAGNOSIS — N2581 Secondary hyperparathyroidism of renal origin: Secondary | ICD-10-CM | POA: Diagnosis not present

## 2015-08-09 DIAGNOSIS — D631 Anemia in chronic kidney disease: Secondary | ICD-10-CM | POA: Diagnosis not present

## 2015-08-11 DIAGNOSIS — N186 End stage renal disease: Secondary | ICD-10-CM | POA: Diagnosis not present

## 2015-08-11 DIAGNOSIS — N2581 Secondary hyperparathyroidism of renal origin: Secondary | ICD-10-CM | POA: Diagnosis not present

## 2015-08-11 DIAGNOSIS — D631 Anemia in chronic kidney disease: Secondary | ICD-10-CM | POA: Diagnosis not present

## 2015-08-11 DIAGNOSIS — Z992 Dependence on renal dialysis: Secondary | ICD-10-CM | POA: Diagnosis not present

## 2015-08-11 DIAGNOSIS — D509 Iron deficiency anemia, unspecified: Secondary | ICD-10-CM | POA: Diagnosis not present

## 2015-08-14 DIAGNOSIS — D509 Iron deficiency anemia, unspecified: Secondary | ICD-10-CM | POA: Diagnosis not present

## 2015-08-14 DIAGNOSIS — Z992 Dependence on renal dialysis: Secondary | ICD-10-CM | POA: Diagnosis not present

## 2015-08-14 DIAGNOSIS — D631 Anemia in chronic kidney disease: Secondary | ICD-10-CM | POA: Diagnosis not present

## 2015-08-14 DIAGNOSIS — N2581 Secondary hyperparathyroidism of renal origin: Secondary | ICD-10-CM | POA: Diagnosis not present

## 2015-08-14 DIAGNOSIS — N186 End stage renal disease: Secondary | ICD-10-CM | POA: Diagnosis not present

## 2015-08-16 DIAGNOSIS — D631 Anemia in chronic kidney disease: Secondary | ICD-10-CM | POA: Diagnosis not present

## 2015-08-16 DIAGNOSIS — N186 End stage renal disease: Secondary | ICD-10-CM | POA: Diagnosis not present

## 2015-08-16 DIAGNOSIS — D509 Iron deficiency anemia, unspecified: Secondary | ICD-10-CM | POA: Diagnosis not present

## 2015-08-16 DIAGNOSIS — N2581 Secondary hyperparathyroidism of renal origin: Secondary | ICD-10-CM | POA: Diagnosis not present

## 2015-08-16 DIAGNOSIS — Z992 Dependence on renal dialysis: Secondary | ICD-10-CM | POA: Diagnosis not present

## 2015-08-18 DIAGNOSIS — Z992 Dependence on renal dialysis: Secondary | ICD-10-CM | POA: Diagnosis not present

## 2015-08-18 DIAGNOSIS — N2581 Secondary hyperparathyroidism of renal origin: Secondary | ICD-10-CM | POA: Diagnosis not present

## 2015-08-18 DIAGNOSIS — D509 Iron deficiency anemia, unspecified: Secondary | ICD-10-CM | POA: Diagnosis not present

## 2015-08-18 DIAGNOSIS — N186 End stage renal disease: Secondary | ICD-10-CM | POA: Diagnosis not present

## 2015-08-18 DIAGNOSIS — D631 Anemia in chronic kidney disease: Secondary | ICD-10-CM | POA: Diagnosis not present

## 2015-08-21 ENCOUNTER — Encounter: Payer: Self-pay | Admitting: *Deleted

## 2015-08-21 DIAGNOSIS — Z992 Dependence on renal dialysis: Secondary | ICD-10-CM | POA: Diagnosis not present

## 2015-08-21 DIAGNOSIS — D631 Anemia in chronic kidney disease: Secondary | ICD-10-CM | POA: Diagnosis not present

## 2015-08-21 DIAGNOSIS — D509 Iron deficiency anemia, unspecified: Secondary | ICD-10-CM | POA: Diagnosis not present

## 2015-08-21 DIAGNOSIS — N186 End stage renal disease: Secondary | ICD-10-CM | POA: Insufficient documentation

## 2015-08-21 DIAGNOSIS — N2581 Secondary hyperparathyroidism of renal origin: Secondary | ICD-10-CM | POA: Diagnosis not present

## 2015-08-23 DIAGNOSIS — D509 Iron deficiency anemia, unspecified: Secondary | ICD-10-CM | POA: Diagnosis not present

## 2015-08-23 DIAGNOSIS — D631 Anemia in chronic kidney disease: Secondary | ICD-10-CM | POA: Diagnosis not present

## 2015-08-23 DIAGNOSIS — N2581 Secondary hyperparathyroidism of renal origin: Secondary | ICD-10-CM | POA: Diagnosis not present

## 2015-08-23 DIAGNOSIS — Z992 Dependence on renal dialysis: Secondary | ICD-10-CM | POA: Diagnosis not present

## 2015-08-23 DIAGNOSIS — N186 End stage renal disease: Secondary | ICD-10-CM | POA: Diagnosis not present

## 2015-08-25 DIAGNOSIS — N2581 Secondary hyperparathyroidism of renal origin: Secondary | ICD-10-CM | POA: Diagnosis not present

## 2015-08-25 DIAGNOSIS — Z992 Dependence on renal dialysis: Secondary | ICD-10-CM | POA: Diagnosis not present

## 2015-08-25 DIAGNOSIS — N186 End stage renal disease: Secondary | ICD-10-CM | POA: Diagnosis not present

## 2015-08-25 DIAGNOSIS — D631 Anemia in chronic kidney disease: Secondary | ICD-10-CM | POA: Diagnosis not present

## 2015-08-25 DIAGNOSIS — D509 Iron deficiency anemia, unspecified: Secondary | ICD-10-CM | POA: Diagnosis not present

## 2015-08-27 DIAGNOSIS — Z992 Dependence on renal dialysis: Secondary | ICD-10-CM | POA: Diagnosis not present

## 2015-08-27 DIAGNOSIS — N186 End stage renal disease: Secondary | ICD-10-CM | POA: Diagnosis not present

## 2015-08-28 DIAGNOSIS — N2581 Secondary hyperparathyroidism of renal origin: Secondary | ICD-10-CM | POA: Diagnosis not present

## 2015-08-28 DIAGNOSIS — Z992 Dependence on renal dialysis: Secondary | ICD-10-CM | POA: Diagnosis not present

## 2015-08-28 DIAGNOSIS — N186 End stage renal disease: Secondary | ICD-10-CM | POA: Diagnosis not present

## 2015-08-28 DIAGNOSIS — D631 Anemia in chronic kidney disease: Secondary | ICD-10-CM | POA: Diagnosis not present

## 2015-08-30 DIAGNOSIS — D631 Anemia in chronic kidney disease: Secondary | ICD-10-CM | POA: Diagnosis not present

## 2015-08-30 DIAGNOSIS — Z992 Dependence on renal dialysis: Secondary | ICD-10-CM | POA: Diagnosis not present

## 2015-08-30 DIAGNOSIS — N2581 Secondary hyperparathyroidism of renal origin: Secondary | ICD-10-CM | POA: Diagnosis not present

## 2015-08-30 DIAGNOSIS — N186 End stage renal disease: Secondary | ICD-10-CM | POA: Diagnosis not present

## 2015-09-02 DIAGNOSIS — D631 Anemia in chronic kidney disease: Secondary | ICD-10-CM | POA: Diagnosis not present

## 2015-09-02 DIAGNOSIS — N186 End stage renal disease: Secondary | ICD-10-CM | POA: Diagnosis not present

## 2015-09-02 DIAGNOSIS — N2581 Secondary hyperparathyroidism of renal origin: Secondary | ICD-10-CM | POA: Diagnosis not present

## 2015-09-02 DIAGNOSIS — Z992 Dependence on renal dialysis: Secondary | ICD-10-CM | POA: Diagnosis not present

## 2015-09-04 DIAGNOSIS — N186 End stage renal disease: Secondary | ICD-10-CM | POA: Diagnosis not present

## 2015-09-04 DIAGNOSIS — Z992 Dependence on renal dialysis: Secondary | ICD-10-CM | POA: Diagnosis not present

## 2015-09-04 DIAGNOSIS — N2581 Secondary hyperparathyroidism of renal origin: Secondary | ICD-10-CM | POA: Diagnosis not present

## 2015-09-04 DIAGNOSIS — D631 Anemia in chronic kidney disease: Secondary | ICD-10-CM | POA: Diagnosis not present

## 2015-09-06 DIAGNOSIS — N2581 Secondary hyperparathyroidism of renal origin: Secondary | ICD-10-CM | POA: Diagnosis not present

## 2015-09-06 DIAGNOSIS — N186 End stage renal disease: Secondary | ICD-10-CM | POA: Diagnosis not present

## 2015-09-06 DIAGNOSIS — D631 Anemia in chronic kidney disease: Secondary | ICD-10-CM | POA: Diagnosis not present

## 2015-09-06 DIAGNOSIS — Z992 Dependence on renal dialysis: Secondary | ICD-10-CM | POA: Diagnosis not present

## 2015-09-08 DIAGNOSIS — N186 End stage renal disease: Secondary | ICD-10-CM | POA: Diagnosis not present

## 2015-09-08 DIAGNOSIS — Z992 Dependence on renal dialysis: Secondary | ICD-10-CM | POA: Diagnosis not present

## 2015-09-08 DIAGNOSIS — D631 Anemia in chronic kidney disease: Secondary | ICD-10-CM | POA: Diagnosis not present

## 2015-09-08 DIAGNOSIS — N2581 Secondary hyperparathyroidism of renal origin: Secondary | ICD-10-CM | POA: Diagnosis not present

## 2015-09-11 DIAGNOSIS — Z992 Dependence on renal dialysis: Secondary | ICD-10-CM | POA: Diagnosis not present

## 2015-09-11 DIAGNOSIS — D631 Anemia in chronic kidney disease: Secondary | ICD-10-CM | POA: Diagnosis not present

## 2015-09-11 DIAGNOSIS — N2581 Secondary hyperparathyroidism of renal origin: Secondary | ICD-10-CM | POA: Diagnosis not present

## 2015-09-11 DIAGNOSIS — N186 End stage renal disease: Secondary | ICD-10-CM | POA: Diagnosis not present

## 2015-09-13 DIAGNOSIS — N2581 Secondary hyperparathyroidism of renal origin: Secondary | ICD-10-CM | POA: Diagnosis not present

## 2015-09-13 DIAGNOSIS — Z992 Dependence on renal dialysis: Secondary | ICD-10-CM | POA: Diagnosis not present

## 2015-09-13 DIAGNOSIS — D631 Anemia in chronic kidney disease: Secondary | ICD-10-CM | POA: Diagnosis not present

## 2015-09-13 DIAGNOSIS — N186 End stage renal disease: Secondary | ICD-10-CM | POA: Diagnosis not present

## 2015-09-15 DIAGNOSIS — D631 Anemia in chronic kidney disease: Secondary | ICD-10-CM | POA: Diagnosis not present

## 2015-09-15 DIAGNOSIS — N186 End stage renal disease: Secondary | ICD-10-CM | POA: Diagnosis not present

## 2015-09-15 DIAGNOSIS — N2581 Secondary hyperparathyroidism of renal origin: Secondary | ICD-10-CM | POA: Diagnosis not present

## 2015-09-15 DIAGNOSIS — Z992 Dependence on renal dialysis: Secondary | ICD-10-CM | POA: Diagnosis not present

## 2015-09-18 DIAGNOSIS — Z992 Dependence on renal dialysis: Secondary | ICD-10-CM | POA: Diagnosis not present

## 2015-09-18 DIAGNOSIS — N2581 Secondary hyperparathyroidism of renal origin: Secondary | ICD-10-CM | POA: Diagnosis not present

## 2015-09-18 DIAGNOSIS — N186 End stage renal disease: Secondary | ICD-10-CM | POA: Diagnosis not present

## 2015-09-18 DIAGNOSIS — D631 Anemia in chronic kidney disease: Secondary | ICD-10-CM | POA: Diagnosis not present

## 2015-09-20 DIAGNOSIS — Z992 Dependence on renal dialysis: Secondary | ICD-10-CM | POA: Diagnosis not present

## 2015-09-20 DIAGNOSIS — N186 End stage renal disease: Secondary | ICD-10-CM | POA: Diagnosis not present

## 2015-09-20 DIAGNOSIS — N2581 Secondary hyperparathyroidism of renal origin: Secondary | ICD-10-CM | POA: Diagnosis not present

## 2015-09-20 DIAGNOSIS — D631 Anemia in chronic kidney disease: Secondary | ICD-10-CM | POA: Diagnosis not present

## 2015-09-22 DIAGNOSIS — Z992 Dependence on renal dialysis: Secondary | ICD-10-CM | POA: Diagnosis not present

## 2015-09-22 DIAGNOSIS — D631 Anemia in chronic kidney disease: Secondary | ICD-10-CM | POA: Diagnosis not present

## 2015-09-22 DIAGNOSIS — N186 End stage renal disease: Secondary | ICD-10-CM | POA: Diagnosis not present

## 2015-09-22 DIAGNOSIS — N2581 Secondary hyperparathyroidism of renal origin: Secondary | ICD-10-CM | POA: Diagnosis not present

## 2015-09-25 DIAGNOSIS — N2581 Secondary hyperparathyroidism of renal origin: Secondary | ICD-10-CM | POA: Diagnosis not present

## 2015-09-25 DIAGNOSIS — D631 Anemia in chronic kidney disease: Secondary | ICD-10-CM | POA: Diagnosis not present

## 2015-09-25 DIAGNOSIS — N186 End stage renal disease: Secondary | ICD-10-CM | POA: Diagnosis not present

## 2015-09-25 DIAGNOSIS — Z992 Dependence on renal dialysis: Secondary | ICD-10-CM | POA: Diagnosis not present

## 2015-09-27 DIAGNOSIS — D631 Anemia in chronic kidney disease: Secondary | ICD-10-CM | POA: Diagnosis not present

## 2015-09-27 DIAGNOSIS — N2581 Secondary hyperparathyroidism of renal origin: Secondary | ICD-10-CM | POA: Diagnosis not present

## 2015-09-27 DIAGNOSIS — N186 End stage renal disease: Secondary | ICD-10-CM | POA: Diagnosis not present

## 2015-09-27 DIAGNOSIS — Z992 Dependence on renal dialysis: Secondary | ICD-10-CM | POA: Diagnosis not present

## 2015-09-29 DIAGNOSIS — D631 Anemia in chronic kidney disease: Secondary | ICD-10-CM | POA: Diagnosis not present

## 2015-09-29 DIAGNOSIS — N2581 Secondary hyperparathyroidism of renal origin: Secondary | ICD-10-CM | POA: Diagnosis not present

## 2015-09-29 DIAGNOSIS — E162 Hypoglycemia, unspecified: Secondary | ICD-10-CM | POA: Diagnosis not present

## 2015-09-29 DIAGNOSIS — N186 End stage renal disease: Secondary | ICD-10-CM | POA: Diagnosis not present

## 2015-09-29 DIAGNOSIS — Z992 Dependence on renal dialysis: Secondary | ICD-10-CM | POA: Diagnosis not present

## 2015-09-29 DIAGNOSIS — Z794 Long term (current) use of insulin: Secondary | ICD-10-CM | POA: Diagnosis not present

## 2015-09-29 DIAGNOSIS — E11649 Type 2 diabetes mellitus with hypoglycemia without coma: Secondary | ICD-10-CM | POA: Diagnosis not present

## 2015-09-29 DIAGNOSIS — Z23 Encounter for immunization: Secondary | ICD-10-CM | POA: Diagnosis not present

## 2015-09-29 DIAGNOSIS — D509 Iron deficiency anemia, unspecified: Secondary | ICD-10-CM | POA: Diagnosis not present

## 2015-10-02 DIAGNOSIS — N2581 Secondary hyperparathyroidism of renal origin: Secondary | ICD-10-CM | POA: Diagnosis not present

## 2015-10-02 DIAGNOSIS — D509 Iron deficiency anemia, unspecified: Secondary | ICD-10-CM | POA: Diagnosis not present

## 2015-10-02 DIAGNOSIS — E162 Hypoglycemia, unspecified: Secondary | ICD-10-CM | POA: Diagnosis not present

## 2015-10-02 DIAGNOSIS — Z23 Encounter for immunization: Secondary | ICD-10-CM | POA: Diagnosis not present

## 2015-10-02 DIAGNOSIS — D631 Anemia in chronic kidney disease: Secondary | ICD-10-CM | POA: Diagnosis not present

## 2015-10-02 DIAGNOSIS — N186 End stage renal disease: Secondary | ICD-10-CM | POA: Diagnosis not present

## 2015-10-04 DIAGNOSIS — Z23 Encounter for immunization: Secondary | ICD-10-CM | POA: Diagnosis not present

## 2015-10-04 DIAGNOSIS — E162 Hypoglycemia, unspecified: Secondary | ICD-10-CM | POA: Diagnosis not present

## 2015-10-04 DIAGNOSIS — D509 Iron deficiency anemia, unspecified: Secondary | ICD-10-CM | POA: Diagnosis not present

## 2015-10-04 DIAGNOSIS — N186 End stage renal disease: Secondary | ICD-10-CM | POA: Diagnosis not present

## 2015-10-04 DIAGNOSIS — N2581 Secondary hyperparathyroidism of renal origin: Secondary | ICD-10-CM | POA: Diagnosis not present

## 2015-10-04 DIAGNOSIS — D631 Anemia in chronic kidney disease: Secondary | ICD-10-CM | POA: Diagnosis not present

## 2015-10-06 DIAGNOSIS — N186 End stage renal disease: Secondary | ICD-10-CM | POA: Diagnosis not present

## 2015-10-06 DIAGNOSIS — E162 Hypoglycemia, unspecified: Secondary | ICD-10-CM | POA: Diagnosis not present

## 2015-10-06 DIAGNOSIS — D631 Anemia in chronic kidney disease: Secondary | ICD-10-CM | POA: Diagnosis not present

## 2015-10-06 DIAGNOSIS — D509 Iron deficiency anemia, unspecified: Secondary | ICD-10-CM | POA: Diagnosis not present

## 2015-10-06 DIAGNOSIS — Z23 Encounter for immunization: Secondary | ICD-10-CM | POA: Diagnosis not present

## 2015-10-06 DIAGNOSIS — N2581 Secondary hyperparathyroidism of renal origin: Secondary | ICD-10-CM | POA: Diagnosis not present

## 2015-10-09 DIAGNOSIS — E162 Hypoglycemia, unspecified: Secondary | ICD-10-CM | POA: Diagnosis not present

## 2015-10-09 DIAGNOSIS — N2581 Secondary hyperparathyroidism of renal origin: Secondary | ICD-10-CM | POA: Diagnosis not present

## 2015-10-09 DIAGNOSIS — D509 Iron deficiency anemia, unspecified: Secondary | ICD-10-CM | POA: Diagnosis not present

## 2015-10-09 DIAGNOSIS — D631 Anemia in chronic kidney disease: Secondary | ICD-10-CM | POA: Diagnosis not present

## 2015-10-09 DIAGNOSIS — N186 End stage renal disease: Secondary | ICD-10-CM | POA: Diagnosis not present

## 2015-10-09 DIAGNOSIS — Z23 Encounter for immunization: Secondary | ICD-10-CM | POA: Diagnosis not present

## 2015-10-11 DIAGNOSIS — D509 Iron deficiency anemia, unspecified: Secondary | ICD-10-CM | POA: Diagnosis not present

## 2015-10-11 DIAGNOSIS — Z23 Encounter for immunization: Secondary | ICD-10-CM | POA: Diagnosis not present

## 2015-10-11 DIAGNOSIS — N2581 Secondary hyperparathyroidism of renal origin: Secondary | ICD-10-CM | POA: Diagnosis not present

## 2015-10-11 DIAGNOSIS — N186 End stage renal disease: Secondary | ICD-10-CM | POA: Diagnosis not present

## 2015-10-11 DIAGNOSIS — E162 Hypoglycemia, unspecified: Secondary | ICD-10-CM | POA: Diagnosis not present

## 2015-10-11 DIAGNOSIS — D631 Anemia in chronic kidney disease: Secondary | ICD-10-CM | POA: Diagnosis not present

## 2015-10-13 DIAGNOSIS — D631 Anemia in chronic kidney disease: Secondary | ICD-10-CM | POA: Diagnosis not present

## 2015-10-13 DIAGNOSIS — D509 Iron deficiency anemia, unspecified: Secondary | ICD-10-CM | POA: Diagnosis not present

## 2015-10-13 DIAGNOSIS — N2581 Secondary hyperparathyroidism of renal origin: Secondary | ICD-10-CM | POA: Diagnosis not present

## 2015-10-13 DIAGNOSIS — N186 End stage renal disease: Secondary | ICD-10-CM | POA: Diagnosis not present

## 2015-10-13 DIAGNOSIS — E162 Hypoglycemia, unspecified: Secondary | ICD-10-CM | POA: Diagnosis not present

## 2015-10-13 DIAGNOSIS — Z23 Encounter for immunization: Secondary | ICD-10-CM | POA: Diagnosis not present

## 2015-10-16 DIAGNOSIS — N186 End stage renal disease: Secondary | ICD-10-CM | POA: Diagnosis not present

## 2015-10-16 DIAGNOSIS — D631 Anemia in chronic kidney disease: Secondary | ICD-10-CM | POA: Diagnosis not present

## 2015-10-16 DIAGNOSIS — D509 Iron deficiency anemia, unspecified: Secondary | ICD-10-CM | POA: Diagnosis not present

## 2015-10-16 DIAGNOSIS — N2581 Secondary hyperparathyroidism of renal origin: Secondary | ICD-10-CM | POA: Diagnosis not present

## 2015-10-16 DIAGNOSIS — Z23 Encounter for immunization: Secondary | ICD-10-CM | POA: Diagnosis not present

## 2015-10-16 DIAGNOSIS — E162 Hypoglycemia, unspecified: Secondary | ICD-10-CM | POA: Diagnosis not present

## 2015-10-18 DIAGNOSIS — N186 End stage renal disease: Secondary | ICD-10-CM | POA: Diagnosis not present

## 2015-10-18 DIAGNOSIS — N2581 Secondary hyperparathyroidism of renal origin: Secondary | ICD-10-CM | POA: Diagnosis not present

## 2015-10-18 DIAGNOSIS — D509 Iron deficiency anemia, unspecified: Secondary | ICD-10-CM | POA: Diagnosis not present

## 2015-10-18 DIAGNOSIS — D631 Anemia in chronic kidney disease: Secondary | ICD-10-CM | POA: Diagnosis not present

## 2015-10-18 DIAGNOSIS — Z23 Encounter for immunization: Secondary | ICD-10-CM | POA: Diagnosis not present

## 2015-10-18 DIAGNOSIS — E162 Hypoglycemia, unspecified: Secondary | ICD-10-CM | POA: Diagnosis not present

## 2015-10-20 DIAGNOSIS — D631 Anemia in chronic kidney disease: Secondary | ICD-10-CM | POA: Diagnosis not present

## 2015-10-20 DIAGNOSIS — N2581 Secondary hyperparathyroidism of renal origin: Secondary | ICD-10-CM | POA: Diagnosis not present

## 2015-10-20 DIAGNOSIS — E162 Hypoglycemia, unspecified: Secondary | ICD-10-CM | POA: Diagnosis not present

## 2015-10-20 DIAGNOSIS — N186 End stage renal disease: Secondary | ICD-10-CM | POA: Diagnosis not present

## 2015-10-20 DIAGNOSIS — Z23 Encounter for immunization: Secondary | ICD-10-CM | POA: Diagnosis not present

## 2015-10-20 DIAGNOSIS — D509 Iron deficiency anemia, unspecified: Secondary | ICD-10-CM | POA: Diagnosis not present

## 2015-10-23 DIAGNOSIS — Z23 Encounter for immunization: Secondary | ICD-10-CM | POA: Diagnosis not present

## 2015-10-23 DIAGNOSIS — N2581 Secondary hyperparathyroidism of renal origin: Secondary | ICD-10-CM | POA: Diagnosis not present

## 2015-10-23 DIAGNOSIS — D509 Iron deficiency anemia, unspecified: Secondary | ICD-10-CM | POA: Diagnosis not present

## 2015-10-23 DIAGNOSIS — N186 End stage renal disease: Secondary | ICD-10-CM | POA: Diagnosis not present

## 2015-10-23 DIAGNOSIS — E162 Hypoglycemia, unspecified: Secondary | ICD-10-CM | POA: Diagnosis not present

## 2015-10-23 DIAGNOSIS — D631 Anemia in chronic kidney disease: Secondary | ICD-10-CM | POA: Diagnosis not present

## 2015-10-25 DIAGNOSIS — E162 Hypoglycemia, unspecified: Secondary | ICD-10-CM | POA: Diagnosis not present

## 2015-10-25 DIAGNOSIS — N2581 Secondary hyperparathyroidism of renal origin: Secondary | ICD-10-CM | POA: Diagnosis not present

## 2015-10-25 DIAGNOSIS — D631 Anemia in chronic kidney disease: Secondary | ICD-10-CM | POA: Diagnosis not present

## 2015-10-25 DIAGNOSIS — N186 End stage renal disease: Secondary | ICD-10-CM | POA: Diagnosis not present

## 2015-10-25 DIAGNOSIS — E119 Type 2 diabetes mellitus without complications: Secondary | ICD-10-CM | POA: Diagnosis not present

## 2015-10-25 DIAGNOSIS — D509 Iron deficiency anemia, unspecified: Secondary | ICD-10-CM | POA: Diagnosis not present

## 2015-10-25 DIAGNOSIS — Z794 Long term (current) use of insulin: Secondary | ICD-10-CM | POA: Diagnosis not present

## 2015-10-25 DIAGNOSIS — Z23 Encounter for immunization: Secondary | ICD-10-CM | POA: Diagnosis not present

## 2015-10-27 DIAGNOSIS — Z23 Encounter for immunization: Secondary | ICD-10-CM | POA: Diagnosis not present

## 2015-10-27 DIAGNOSIS — N186 End stage renal disease: Secondary | ICD-10-CM | POA: Diagnosis not present

## 2015-10-27 DIAGNOSIS — D509 Iron deficiency anemia, unspecified: Secondary | ICD-10-CM | POA: Diagnosis not present

## 2015-10-27 DIAGNOSIS — N2581 Secondary hyperparathyroidism of renal origin: Secondary | ICD-10-CM | POA: Diagnosis not present

## 2015-10-27 DIAGNOSIS — Z992 Dependence on renal dialysis: Secondary | ICD-10-CM | POA: Diagnosis not present

## 2015-10-27 DIAGNOSIS — E162 Hypoglycemia, unspecified: Secondary | ICD-10-CM | POA: Diagnosis not present

## 2015-10-27 DIAGNOSIS — D631 Anemia in chronic kidney disease: Secondary | ICD-10-CM | POA: Diagnosis not present

## 2015-10-30 DIAGNOSIS — N186 End stage renal disease: Secondary | ICD-10-CM | POA: Diagnosis not present

## 2015-10-30 DIAGNOSIS — Z992 Dependence on renal dialysis: Secondary | ICD-10-CM | POA: Diagnosis not present

## 2015-10-30 DIAGNOSIS — D631 Anemia in chronic kidney disease: Secondary | ICD-10-CM | POA: Diagnosis not present

## 2015-10-30 DIAGNOSIS — D509 Iron deficiency anemia, unspecified: Secondary | ICD-10-CM | POA: Diagnosis not present

## 2015-10-30 DIAGNOSIS — N2581 Secondary hyperparathyroidism of renal origin: Secondary | ICD-10-CM | POA: Diagnosis not present

## 2015-11-01 DIAGNOSIS — D509 Iron deficiency anemia, unspecified: Secondary | ICD-10-CM | POA: Diagnosis not present

## 2015-11-01 DIAGNOSIS — N2581 Secondary hyperparathyroidism of renal origin: Secondary | ICD-10-CM | POA: Diagnosis not present

## 2015-11-01 DIAGNOSIS — D631 Anemia in chronic kidney disease: Secondary | ICD-10-CM | POA: Diagnosis not present

## 2015-11-01 DIAGNOSIS — N186 End stage renal disease: Secondary | ICD-10-CM | POA: Diagnosis not present

## 2015-11-01 DIAGNOSIS — Z992 Dependence on renal dialysis: Secondary | ICD-10-CM | POA: Diagnosis not present

## 2015-11-03 DIAGNOSIS — N186 End stage renal disease: Secondary | ICD-10-CM | POA: Diagnosis not present

## 2015-11-03 DIAGNOSIS — N2581 Secondary hyperparathyroidism of renal origin: Secondary | ICD-10-CM | POA: Diagnosis not present

## 2015-11-03 DIAGNOSIS — D509 Iron deficiency anemia, unspecified: Secondary | ICD-10-CM | POA: Diagnosis not present

## 2015-11-03 DIAGNOSIS — D631 Anemia in chronic kidney disease: Secondary | ICD-10-CM | POA: Diagnosis not present

## 2015-11-03 DIAGNOSIS — Z992 Dependence on renal dialysis: Secondary | ICD-10-CM | POA: Diagnosis not present

## 2015-11-06 DIAGNOSIS — Z992 Dependence on renal dialysis: Secondary | ICD-10-CM | POA: Diagnosis not present

## 2015-11-06 DIAGNOSIS — D631 Anemia in chronic kidney disease: Secondary | ICD-10-CM | POA: Diagnosis not present

## 2015-11-06 DIAGNOSIS — N2581 Secondary hyperparathyroidism of renal origin: Secondary | ICD-10-CM | POA: Diagnosis not present

## 2015-11-06 DIAGNOSIS — D509 Iron deficiency anemia, unspecified: Secondary | ICD-10-CM | POA: Diagnosis not present

## 2015-11-06 DIAGNOSIS — N186 End stage renal disease: Secondary | ICD-10-CM | POA: Diagnosis not present

## 2015-11-10 DIAGNOSIS — N186 End stage renal disease: Secondary | ICD-10-CM | POA: Diagnosis not present

## 2015-11-10 DIAGNOSIS — Z992 Dependence on renal dialysis: Secondary | ICD-10-CM | POA: Diagnosis not present

## 2015-11-10 DIAGNOSIS — D509 Iron deficiency anemia, unspecified: Secondary | ICD-10-CM | POA: Diagnosis not present

## 2015-11-10 DIAGNOSIS — N2581 Secondary hyperparathyroidism of renal origin: Secondary | ICD-10-CM | POA: Diagnosis not present

## 2015-11-10 DIAGNOSIS — D631 Anemia in chronic kidney disease: Secondary | ICD-10-CM | POA: Diagnosis not present

## 2015-11-13 DIAGNOSIS — Z992 Dependence on renal dialysis: Secondary | ICD-10-CM | POA: Diagnosis not present

## 2015-11-13 DIAGNOSIS — N186 End stage renal disease: Secondary | ICD-10-CM | POA: Diagnosis not present

## 2015-11-13 DIAGNOSIS — D509 Iron deficiency anemia, unspecified: Secondary | ICD-10-CM | POA: Diagnosis not present

## 2015-11-13 DIAGNOSIS — N2581 Secondary hyperparathyroidism of renal origin: Secondary | ICD-10-CM | POA: Diagnosis not present

## 2015-11-13 DIAGNOSIS — D631 Anemia in chronic kidney disease: Secondary | ICD-10-CM | POA: Diagnosis not present

## 2015-11-15 DIAGNOSIS — D509 Iron deficiency anemia, unspecified: Secondary | ICD-10-CM | POA: Diagnosis not present

## 2015-11-15 DIAGNOSIS — N186 End stage renal disease: Secondary | ICD-10-CM | POA: Diagnosis not present

## 2015-11-15 DIAGNOSIS — Z992 Dependence on renal dialysis: Secondary | ICD-10-CM | POA: Diagnosis not present

## 2015-11-15 DIAGNOSIS — N2581 Secondary hyperparathyroidism of renal origin: Secondary | ICD-10-CM | POA: Diagnosis not present

## 2015-11-15 DIAGNOSIS — D631 Anemia in chronic kidney disease: Secondary | ICD-10-CM | POA: Diagnosis not present

## 2015-11-17 DIAGNOSIS — N186 End stage renal disease: Secondary | ICD-10-CM | POA: Diagnosis not present

## 2015-11-17 DIAGNOSIS — Z992 Dependence on renal dialysis: Secondary | ICD-10-CM | POA: Diagnosis not present

## 2015-11-17 DIAGNOSIS — D509 Iron deficiency anemia, unspecified: Secondary | ICD-10-CM | POA: Diagnosis not present

## 2015-11-17 DIAGNOSIS — N2581 Secondary hyperparathyroidism of renal origin: Secondary | ICD-10-CM | POA: Diagnosis not present

## 2015-11-17 DIAGNOSIS — D631 Anemia in chronic kidney disease: Secondary | ICD-10-CM | POA: Diagnosis not present

## 2015-11-20 DIAGNOSIS — Z992 Dependence on renal dialysis: Secondary | ICD-10-CM | POA: Diagnosis not present

## 2015-11-20 DIAGNOSIS — D631 Anemia in chronic kidney disease: Secondary | ICD-10-CM | POA: Diagnosis not present

## 2015-11-20 DIAGNOSIS — N2581 Secondary hyperparathyroidism of renal origin: Secondary | ICD-10-CM | POA: Diagnosis not present

## 2015-11-20 DIAGNOSIS — N186 End stage renal disease: Secondary | ICD-10-CM | POA: Diagnosis not present

## 2015-11-20 DIAGNOSIS — D509 Iron deficiency anemia, unspecified: Secondary | ICD-10-CM | POA: Diagnosis not present

## 2015-11-22 DIAGNOSIS — D509 Iron deficiency anemia, unspecified: Secondary | ICD-10-CM | POA: Diagnosis not present

## 2015-11-22 DIAGNOSIS — D631 Anemia in chronic kidney disease: Secondary | ICD-10-CM | POA: Diagnosis not present

## 2015-11-22 DIAGNOSIS — Z992 Dependence on renal dialysis: Secondary | ICD-10-CM | POA: Diagnosis not present

## 2015-11-22 DIAGNOSIS — N2581 Secondary hyperparathyroidism of renal origin: Secondary | ICD-10-CM | POA: Diagnosis not present

## 2015-11-22 DIAGNOSIS — N186 End stage renal disease: Secondary | ICD-10-CM | POA: Diagnosis not present

## 2015-11-24 DIAGNOSIS — N186 End stage renal disease: Secondary | ICD-10-CM | POA: Diagnosis not present

## 2015-11-24 DIAGNOSIS — N2581 Secondary hyperparathyroidism of renal origin: Secondary | ICD-10-CM | POA: Diagnosis not present

## 2015-11-24 DIAGNOSIS — D509 Iron deficiency anemia, unspecified: Secondary | ICD-10-CM | POA: Diagnosis not present

## 2015-11-24 DIAGNOSIS — D631 Anemia in chronic kidney disease: Secondary | ICD-10-CM | POA: Diagnosis not present

## 2015-11-24 DIAGNOSIS — Z992 Dependence on renal dialysis: Secondary | ICD-10-CM | POA: Diagnosis not present

## 2015-11-27 DIAGNOSIS — Z992 Dependence on renal dialysis: Secondary | ICD-10-CM | POA: Diagnosis not present

## 2015-11-27 DIAGNOSIS — N2581 Secondary hyperparathyroidism of renal origin: Secondary | ICD-10-CM | POA: Diagnosis not present

## 2015-11-27 DIAGNOSIS — N186 End stage renal disease: Secondary | ICD-10-CM | POA: Diagnosis not present

## 2015-11-27 DIAGNOSIS — D631 Anemia in chronic kidney disease: Secondary | ICD-10-CM | POA: Diagnosis not present

## 2015-11-27 DIAGNOSIS — D509 Iron deficiency anemia, unspecified: Secondary | ICD-10-CM | POA: Diagnosis not present

## 2015-11-29 DIAGNOSIS — D631 Anemia in chronic kidney disease: Secondary | ICD-10-CM | POA: Diagnosis not present

## 2015-11-29 DIAGNOSIS — Z992 Dependence on renal dialysis: Secondary | ICD-10-CM | POA: Diagnosis not present

## 2015-11-29 DIAGNOSIS — N2581 Secondary hyperparathyroidism of renal origin: Secondary | ICD-10-CM | POA: Diagnosis not present

## 2015-11-29 DIAGNOSIS — D509 Iron deficiency anemia, unspecified: Secondary | ICD-10-CM | POA: Diagnosis not present

## 2015-11-29 DIAGNOSIS — N186 End stage renal disease: Secondary | ICD-10-CM | POA: Diagnosis not present

## 2015-12-01 DIAGNOSIS — Z992 Dependence on renal dialysis: Secondary | ICD-10-CM | POA: Diagnosis not present

## 2015-12-01 DIAGNOSIS — N2581 Secondary hyperparathyroidism of renal origin: Secondary | ICD-10-CM | POA: Diagnosis not present

## 2015-12-01 DIAGNOSIS — D509 Iron deficiency anemia, unspecified: Secondary | ICD-10-CM | POA: Diagnosis not present

## 2015-12-01 DIAGNOSIS — N186 End stage renal disease: Secondary | ICD-10-CM | POA: Diagnosis not present

## 2015-12-01 DIAGNOSIS — D631 Anemia in chronic kidney disease: Secondary | ICD-10-CM | POA: Diagnosis not present

## 2015-12-04 DIAGNOSIS — D631 Anemia in chronic kidney disease: Secondary | ICD-10-CM | POA: Diagnosis not present

## 2015-12-04 DIAGNOSIS — Z992 Dependence on renal dialysis: Secondary | ICD-10-CM | POA: Diagnosis not present

## 2015-12-04 DIAGNOSIS — N186 End stage renal disease: Secondary | ICD-10-CM | POA: Diagnosis not present

## 2015-12-04 DIAGNOSIS — D509 Iron deficiency anemia, unspecified: Secondary | ICD-10-CM | POA: Diagnosis not present

## 2015-12-04 DIAGNOSIS — N2581 Secondary hyperparathyroidism of renal origin: Secondary | ICD-10-CM | POA: Diagnosis not present

## 2015-12-06 DIAGNOSIS — Z992 Dependence on renal dialysis: Secondary | ICD-10-CM | POA: Diagnosis not present

## 2015-12-06 DIAGNOSIS — N186 End stage renal disease: Secondary | ICD-10-CM | POA: Diagnosis not present

## 2015-12-06 DIAGNOSIS — N2581 Secondary hyperparathyroidism of renal origin: Secondary | ICD-10-CM | POA: Diagnosis not present

## 2015-12-06 DIAGNOSIS — D509 Iron deficiency anemia, unspecified: Secondary | ICD-10-CM | POA: Diagnosis not present

## 2015-12-06 DIAGNOSIS — D631 Anemia in chronic kidney disease: Secondary | ICD-10-CM | POA: Diagnosis not present

## 2015-12-08 DIAGNOSIS — N2581 Secondary hyperparathyroidism of renal origin: Secondary | ICD-10-CM | POA: Diagnosis not present

## 2015-12-08 DIAGNOSIS — N186 End stage renal disease: Secondary | ICD-10-CM | POA: Diagnosis not present

## 2015-12-08 DIAGNOSIS — D509 Iron deficiency anemia, unspecified: Secondary | ICD-10-CM | POA: Diagnosis not present

## 2015-12-08 DIAGNOSIS — D631 Anemia in chronic kidney disease: Secondary | ICD-10-CM | POA: Diagnosis not present

## 2015-12-08 DIAGNOSIS — Z992 Dependence on renal dialysis: Secondary | ICD-10-CM | POA: Diagnosis not present

## 2015-12-11 DIAGNOSIS — D509 Iron deficiency anemia, unspecified: Secondary | ICD-10-CM | POA: Diagnosis not present

## 2015-12-11 DIAGNOSIS — N2581 Secondary hyperparathyroidism of renal origin: Secondary | ICD-10-CM | POA: Diagnosis not present

## 2015-12-11 DIAGNOSIS — Z992 Dependence on renal dialysis: Secondary | ICD-10-CM | POA: Diagnosis not present

## 2015-12-11 DIAGNOSIS — N186 End stage renal disease: Secondary | ICD-10-CM | POA: Diagnosis not present

## 2015-12-11 DIAGNOSIS — D631 Anemia in chronic kidney disease: Secondary | ICD-10-CM | POA: Diagnosis not present

## 2015-12-12 ENCOUNTER — Ambulatory Visit: Payer: Medicare Other | Admitting: Family Medicine

## 2015-12-13 DIAGNOSIS — D509 Iron deficiency anemia, unspecified: Secondary | ICD-10-CM | POA: Diagnosis not present

## 2015-12-13 DIAGNOSIS — N2581 Secondary hyperparathyroidism of renal origin: Secondary | ICD-10-CM | POA: Diagnosis not present

## 2015-12-13 DIAGNOSIS — D631 Anemia in chronic kidney disease: Secondary | ICD-10-CM | POA: Diagnosis not present

## 2015-12-13 DIAGNOSIS — Z992 Dependence on renal dialysis: Secondary | ICD-10-CM | POA: Diagnosis not present

## 2015-12-13 DIAGNOSIS — N186 End stage renal disease: Secondary | ICD-10-CM | POA: Diagnosis not present

## 2015-12-15 DIAGNOSIS — N2581 Secondary hyperparathyroidism of renal origin: Secondary | ICD-10-CM | POA: Diagnosis not present

## 2015-12-15 DIAGNOSIS — Z992 Dependence on renal dialysis: Secondary | ICD-10-CM | POA: Diagnosis not present

## 2015-12-15 DIAGNOSIS — N186 End stage renal disease: Secondary | ICD-10-CM | POA: Diagnosis not present

## 2015-12-15 DIAGNOSIS — D509 Iron deficiency anemia, unspecified: Secondary | ICD-10-CM | POA: Diagnosis not present

## 2015-12-15 DIAGNOSIS — D631 Anemia in chronic kidney disease: Secondary | ICD-10-CM | POA: Diagnosis not present

## 2015-12-18 DIAGNOSIS — D509 Iron deficiency anemia, unspecified: Secondary | ICD-10-CM | POA: Diagnosis not present

## 2015-12-18 DIAGNOSIS — N186 End stage renal disease: Secondary | ICD-10-CM | POA: Diagnosis not present

## 2015-12-18 DIAGNOSIS — N2581 Secondary hyperparathyroidism of renal origin: Secondary | ICD-10-CM | POA: Diagnosis not present

## 2015-12-18 DIAGNOSIS — Z992 Dependence on renal dialysis: Secondary | ICD-10-CM | POA: Diagnosis not present

## 2015-12-18 DIAGNOSIS — D631 Anemia in chronic kidney disease: Secondary | ICD-10-CM | POA: Diagnosis not present

## 2015-12-20 DIAGNOSIS — N186 End stage renal disease: Secondary | ICD-10-CM | POA: Diagnosis not present

## 2015-12-20 DIAGNOSIS — Z992 Dependence on renal dialysis: Secondary | ICD-10-CM | POA: Diagnosis not present

## 2015-12-20 DIAGNOSIS — D509 Iron deficiency anemia, unspecified: Secondary | ICD-10-CM | POA: Diagnosis not present

## 2015-12-20 DIAGNOSIS — N2581 Secondary hyperparathyroidism of renal origin: Secondary | ICD-10-CM | POA: Diagnosis not present

## 2015-12-20 DIAGNOSIS — D631 Anemia in chronic kidney disease: Secondary | ICD-10-CM | POA: Diagnosis not present

## 2015-12-22 DIAGNOSIS — D631 Anemia in chronic kidney disease: Secondary | ICD-10-CM | POA: Diagnosis not present

## 2015-12-22 DIAGNOSIS — N186 End stage renal disease: Secondary | ICD-10-CM | POA: Diagnosis not present

## 2015-12-22 DIAGNOSIS — Z992 Dependence on renal dialysis: Secondary | ICD-10-CM | POA: Diagnosis not present

## 2015-12-22 DIAGNOSIS — N2581 Secondary hyperparathyroidism of renal origin: Secondary | ICD-10-CM | POA: Diagnosis not present

## 2015-12-22 DIAGNOSIS — D509 Iron deficiency anemia, unspecified: Secondary | ICD-10-CM | POA: Diagnosis not present

## 2015-12-25 DIAGNOSIS — D509 Iron deficiency anemia, unspecified: Secondary | ICD-10-CM | POA: Diagnosis not present

## 2015-12-25 DIAGNOSIS — D631 Anemia in chronic kidney disease: Secondary | ICD-10-CM | POA: Diagnosis not present

## 2015-12-25 DIAGNOSIS — N2581 Secondary hyperparathyroidism of renal origin: Secondary | ICD-10-CM | POA: Diagnosis not present

## 2015-12-25 DIAGNOSIS — N186 End stage renal disease: Secondary | ICD-10-CM | POA: Diagnosis not present

## 2015-12-25 DIAGNOSIS — Z992 Dependence on renal dialysis: Secondary | ICD-10-CM | POA: Diagnosis not present

## 2015-12-27 DIAGNOSIS — N2581 Secondary hyperparathyroidism of renal origin: Secondary | ICD-10-CM | POA: Diagnosis not present

## 2015-12-27 DIAGNOSIS — Z992 Dependence on renal dialysis: Secondary | ICD-10-CM | POA: Diagnosis not present

## 2015-12-27 DIAGNOSIS — D509 Iron deficiency anemia, unspecified: Secondary | ICD-10-CM | POA: Diagnosis not present

## 2015-12-27 DIAGNOSIS — D631 Anemia in chronic kidney disease: Secondary | ICD-10-CM | POA: Diagnosis not present

## 2015-12-27 DIAGNOSIS — N186 End stage renal disease: Secondary | ICD-10-CM | POA: Diagnosis not present

## 2015-12-28 DIAGNOSIS — N186 End stage renal disease: Secondary | ICD-10-CM | POA: Diagnosis not present

## 2015-12-28 DIAGNOSIS — Z992 Dependence on renal dialysis: Secondary | ICD-10-CM | POA: Diagnosis not present

## 2015-12-29 DIAGNOSIS — N2581 Secondary hyperparathyroidism of renal origin: Secondary | ICD-10-CM | POA: Diagnosis not present

## 2015-12-29 DIAGNOSIS — N186 End stage renal disease: Secondary | ICD-10-CM | POA: Diagnosis not present

## 2015-12-29 DIAGNOSIS — E11649 Type 2 diabetes mellitus with hypoglycemia without coma: Secondary | ICD-10-CM | POA: Diagnosis not present

## 2015-12-29 DIAGNOSIS — D631 Anemia in chronic kidney disease: Secondary | ICD-10-CM | POA: Diagnosis not present

## 2015-12-29 DIAGNOSIS — Z992 Dependence on renal dialysis: Secondary | ICD-10-CM | POA: Diagnosis not present

## 2015-12-29 DIAGNOSIS — D509 Iron deficiency anemia, unspecified: Secondary | ICD-10-CM | POA: Diagnosis not present

## 2015-12-29 DIAGNOSIS — E162 Hypoglycemia, unspecified: Secondary | ICD-10-CM | POA: Diagnosis not present

## 2015-12-29 DIAGNOSIS — Z794 Long term (current) use of insulin: Secondary | ICD-10-CM | POA: Diagnosis not present

## 2016-01-01 DIAGNOSIS — D631 Anemia in chronic kidney disease: Secondary | ICD-10-CM | POA: Diagnosis not present

## 2016-01-01 DIAGNOSIS — E162 Hypoglycemia, unspecified: Secondary | ICD-10-CM | POA: Diagnosis not present

## 2016-01-01 DIAGNOSIS — N186 End stage renal disease: Secondary | ICD-10-CM | POA: Diagnosis not present

## 2016-01-01 DIAGNOSIS — D509 Iron deficiency anemia, unspecified: Secondary | ICD-10-CM | POA: Diagnosis not present

## 2016-01-01 DIAGNOSIS — N2581 Secondary hyperparathyroidism of renal origin: Secondary | ICD-10-CM | POA: Diagnosis not present

## 2016-01-01 DIAGNOSIS — Z992 Dependence on renal dialysis: Secondary | ICD-10-CM | POA: Diagnosis not present

## 2016-01-03 DIAGNOSIS — N2581 Secondary hyperparathyroidism of renal origin: Secondary | ICD-10-CM | POA: Diagnosis not present

## 2016-01-03 DIAGNOSIS — D509 Iron deficiency anemia, unspecified: Secondary | ICD-10-CM | POA: Diagnosis not present

## 2016-01-03 DIAGNOSIS — Z992 Dependence on renal dialysis: Secondary | ICD-10-CM | POA: Diagnosis not present

## 2016-01-03 DIAGNOSIS — D631 Anemia in chronic kidney disease: Secondary | ICD-10-CM | POA: Diagnosis not present

## 2016-01-03 DIAGNOSIS — E162 Hypoglycemia, unspecified: Secondary | ICD-10-CM | POA: Diagnosis not present

## 2016-01-03 DIAGNOSIS — N186 End stage renal disease: Secondary | ICD-10-CM | POA: Diagnosis not present

## 2016-01-05 DIAGNOSIS — D631 Anemia in chronic kidney disease: Secondary | ICD-10-CM | POA: Diagnosis not present

## 2016-01-05 DIAGNOSIS — Z992 Dependence on renal dialysis: Secondary | ICD-10-CM | POA: Diagnosis not present

## 2016-01-05 DIAGNOSIS — E162 Hypoglycemia, unspecified: Secondary | ICD-10-CM | POA: Diagnosis not present

## 2016-01-05 DIAGNOSIS — D509 Iron deficiency anemia, unspecified: Secondary | ICD-10-CM | POA: Diagnosis not present

## 2016-01-05 DIAGNOSIS — N186 End stage renal disease: Secondary | ICD-10-CM | POA: Diagnosis not present

## 2016-01-05 DIAGNOSIS — N2581 Secondary hyperparathyroidism of renal origin: Secondary | ICD-10-CM | POA: Diagnosis not present

## 2016-01-07 DIAGNOSIS — N186 End stage renal disease: Secondary | ICD-10-CM | POA: Diagnosis not present

## 2016-01-07 DIAGNOSIS — D509 Iron deficiency anemia, unspecified: Secondary | ICD-10-CM | POA: Diagnosis not present

## 2016-01-07 DIAGNOSIS — N2581 Secondary hyperparathyroidism of renal origin: Secondary | ICD-10-CM | POA: Diagnosis not present

## 2016-01-07 DIAGNOSIS — E162 Hypoglycemia, unspecified: Secondary | ICD-10-CM | POA: Diagnosis not present

## 2016-01-07 DIAGNOSIS — Z992 Dependence on renal dialysis: Secondary | ICD-10-CM | POA: Diagnosis not present

## 2016-01-07 DIAGNOSIS — D631 Anemia in chronic kidney disease: Secondary | ICD-10-CM | POA: Diagnosis not present

## 2016-01-10 DIAGNOSIS — D509 Iron deficiency anemia, unspecified: Secondary | ICD-10-CM | POA: Diagnosis not present

## 2016-01-10 DIAGNOSIS — N186 End stage renal disease: Secondary | ICD-10-CM | POA: Diagnosis not present

## 2016-01-10 DIAGNOSIS — Z992 Dependence on renal dialysis: Secondary | ICD-10-CM | POA: Diagnosis not present

## 2016-01-10 DIAGNOSIS — N2581 Secondary hyperparathyroidism of renal origin: Secondary | ICD-10-CM | POA: Diagnosis not present

## 2016-01-10 DIAGNOSIS — D631 Anemia in chronic kidney disease: Secondary | ICD-10-CM | POA: Diagnosis not present

## 2016-01-10 DIAGNOSIS — E162 Hypoglycemia, unspecified: Secondary | ICD-10-CM | POA: Diagnosis not present

## 2016-01-11 DIAGNOSIS — N186 End stage renal disease: Secondary | ICD-10-CM | POA: Diagnosis not present

## 2016-01-12 DIAGNOSIS — N186 End stage renal disease: Secondary | ICD-10-CM | POA: Diagnosis not present

## 2016-01-12 DIAGNOSIS — Z992 Dependence on renal dialysis: Secondary | ICD-10-CM | POA: Diagnosis not present

## 2016-01-12 DIAGNOSIS — N2581 Secondary hyperparathyroidism of renal origin: Secondary | ICD-10-CM | POA: Diagnosis not present

## 2016-01-12 DIAGNOSIS — D509 Iron deficiency anemia, unspecified: Secondary | ICD-10-CM | POA: Diagnosis not present

## 2016-01-12 DIAGNOSIS — E162 Hypoglycemia, unspecified: Secondary | ICD-10-CM | POA: Diagnosis not present

## 2016-01-12 DIAGNOSIS — D631 Anemia in chronic kidney disease: Secondary | ICD-10-CM | POA: Diagnosis not present

## 2016-01-15 DIAGNOSIS — Z992 Dependence on renal dialysis: Secondary | ICD-10-CM | POA: Diagnosis not present

## 2016-01-15 DIAGNOSIS — N186 End stage renal disease: Secondary | ICD-10-CM | POA: Diagnosis not present

## 2016-01-15 DIAGNOSIS — D631 Anemia in chronic kidney disease: Secondary | ICD-10-CM | POA: Diagnosis not present

## 2016-01-15 DIAGNOSIS — N2581 Secondary hyperparathyroidism of renal origin: Secondary | ICD-10-CM | POA: Diagnosis not present

## 2016-01-15 DIAGNOSIS — E162 Hypoglycemia, unspecified: Secondary | ICD-10-CM | POA: Diagnosis not present

## 2016-01-15 DIAGNOSIS — D509 Iron deficiency anemia, unspecified: Secondary | ICD-10-CM | POA: Diagnosis not present

## 2016-01-16 ENCOUNTER — Encounter (HOSPITAL_COMMUNITY): Payer: Self-pay | Admitting: *Deleted

## 2016-01-16 ENCOUNTER — Ambulatory Visit (HOSPITAL_COMMUNITY): Payer: Medicare Other

## 2016-01-16 ENCOUNTER — Encounter (HOSPITAL_COMMUNITY)
Admission: RE | Disposition: A | Discharge status: Home / Self Care | Payer: Self-pay | Source: Ambulatory Visit | Attending: Surgery

## 2016-01-16 ENCOUNTER — Ambulatory Visit (HOSPITAL_COMMUNITY)
Admission: RE | Admit: 2016-01-16 | Discharge: 2016-01-16 | Disposition: A | Discharge status: Home / Self Care | Payer: Medicare Other | Source: Ambulatory Visit | Attending: Surgery | Admitting: Surgery

## 2016-01-16 DIAGNOSIS — Z992 Dependence on renal dialysis: Secondary | ICD-10-CM | POA: Diagnosis not present

## 2016-01-16 DIAGNOSIS — N186 End stage renal disease: Secondary | ICD-10-CM | POA: Diagnosis not present

## 2016-01-16 DIAGNOSIS — I509 Heart failure, unspecified: Secondary | ICD-10-CM | POA: Insufficient documentation

## 2016-01-16 DIAGNOSIS — E1140 Type 2 diabetes mellitus with diabetic neuropathy, unspecified: Secondary | ICD-10-CM | POA: Diagnosis not present

## 2016-01-16 DIAGNOSIS — Z87891 Personal history of nicotine dependence: Secondary | ICD-10-CM | POA: Insufficient documentation

## 2016-01-16 DIAGNOSIS — E1122 Type 2 diabetes mellitus with diabetic chronic kidney disease: Secondary | ICD-10-CM | POA: Diagnosis not present

## 2016-01-16 DIAGNOSIS — T82858A Stenosis of vascular prosthetic devices, implants and grafts, initial encounter: Secondary | ICD-10-CM | POA: Insufficient documentation

## 2016-01-16 DIAGNOSIS — M1990 Unspecified osteoarthritis, unspecified site: Secondary | ICD-10-CM | POA: Insufficient documentation

## 2016-01-16 DIAGNOSIS — Z794 Long term (current) use of insulin: Secondary | ICD-10-CM | POA: Insufficient documentation

## 2016-01-16 DIAGNOSIS — Z833 Family history of diabetes mellitus: Secondary | ICD-10-CM | POA: Insufficient documentation

## 2016-01-16 DIAGNOSIS — Z8249 Family history of ischemic heart disease and other diseases of the circulatory system: Secondary | ICD-10-CM | POA: Diagnosis not present

## 2016-01-16 DIAGNOSIS — I132 Hypertensive heart and chronic kidney disease with heart failure and with stage 5 chronic kidney disease, or end stage renal disease: Secondary | ICD-10-CM | POA: Diagnosis not present

## 2016-01-16 DIAGNOSIS — T82858D Stenosis of vascular prosthetic devices, implants and grafts, subsequent encounter: Secondary | ICD-10-CM | POA: Diagnosis not present

## 2016-01-16 DIAGNOSIS — D649 Anemia, unspecified: Secondary | ICD-10-CM | POA: Diagnosis not present

## 2016-01-16 DIAGNOSIS — Y832 Surgical operation with anastomosis, bypass or graft as the cause of abnormal reaction of the patient, or of later complication, without mention of misadventure at the time of the procedure: Secondary | ICD-10-CM | POA: Insufficient documentation

## 2016-01-16 DIAGNOSIS — J45909 Unspecified asthma, uncomplicated: Secondary | ICD-10-CM | POA: Insufficient documentation

## 2016-01-16 HISTORY — PX: FISTULOGRAM: SHX5832

## 2016-01-16 LAB — POCT I-STAT, CHEM 8
BUN: 24 mg/dL — ABNORMAL HIGH (ref 6–20)
Calcium, Ion: 1.04 mmol/L — ABNORMAL LOW (ref 1.15–1.40)
Chloride: 98 mmol/L — ABNORMAL LOW (ref 101–111)
Creatinine, Ser: 4.5 mg/dL — ABNORMAL HIGH (ref 0.44–1.00)
Glucose, Bld: 164 mg/dL — ABNORMAL HIGH (ref 65–99)
HCT: 40 % (ref 36.0–46.0)
Hemoglobin: 13.6 g/dL (ref 12.0–15.0)
Potassium: 3.9 mmol/L (ref 3.5–5.1)
Sodium: 139 mmol/L (ref 135–145)
TCO2: 30 mmol/L (ref 0–100)

## 2016-01-16 SURGERY — FISTULOGRAM
Anesthesia: LOCAL | Site: Arm Upper | Laterality: Right

## 2016-01-16 MED ORDER — IOPAMIDOL (ISOVUE-300) INJECTION 61%
INTRAVENOUS | Status: AC
Start: 2016-01-16 — End: 2016-01-16
  Filled 2016-01-16: qty 50

## 2016-01-16 MED ORDER — BUPIVACAINE HCL (PF) 0.5 % IJ SOLN
INTRAMUSCULAR | Status: AC
  Filled 2016-01-16: qty 30

## 2016-01-16 MED ORDER — METOPROLOL SUCCINATE ER 50 MG PO TB24
100.0000 mg | ORAL_TABLET | Freq: Once | ORAL | Status: AC
  Administered 2016-01-16: 100 mg via ORAL
  Filled 2016-01-16: qty 2

## 2016-01-16 MED ORDER — HEPARIN SODIUM (PORCINE) 1000 UNIT/ML IJ SOLN
INTRAMUSCULAR | Status: AC
Start: 2016-01-16 — End: 2016-01-16
  Filled 2016-01-16: qty 3

## 2016-01-16 MED ORDER — CHLORHEXIDINE GLUCONATE CLOTH 2 % EX PADS: 6.0000 | MEDICATED_PAD | Freq: Once | CUTANEOUS | Status: DC

## 2016-01-16 MED ORDER — IOPAMIDOL (ISOVUE-300) INJECTION 61%
INTRAVENOUS | Status: AC
  Filled 2016-01-16: qty 50

## 2016-01-16 MED ORDER — LIDOCAINE HCL (PF) 1 % IJ SOLN
INTRAMUSCULAR | Status: AC
  Filled 2016-01-16: qty 30

## 2016-01-16 MED ORDER — BUPIVACAINE HCL 0.5 % IJ SOLN
INTRAMUSCULAR | Status: DC | PRN
  Administered 2016-01-16: .5 mL via INTRAMUSCULAR

## 2016-01-16 MED ORDER — HEPARIN SODIUM (PORCINE) 1000 UNIT/ML IJ SOLN
INTRAMUSCULAR | Status: DC | PRN
  Administered 2016-01-16: 3000 [IU] via INTRAVENOUS

## 2016-01-16 MED ORDER — IOPAMIDOL (ISOVUE-300) INJECTION 61%: INTRAVENOUS | Status: DC | PRN

## 2016-01-16 MED ORDER — IOPAMIDOL (ISOVUE-300) INJECTION 61%
INTRAVENOUS | Status: DC | PRN
  Administered 2016-01-16: 50 mL via INTRAVENOUS

## 2016-01-16 MED ORDER — SODIUM CHLORIDE 0.9 % IV SOLN
INTRAVENOUS | Status: DC | PRN
  Administered 2016-01-16: 500 mL

## 2016-01-16 MED ORDER — HEPARIN SODIUM (PORCINE) 5000 UNIT/ML IJ SOLN
Freq: Once | INTRAMUSCULAR | Status: DC
  Filled 2016-01-16: qty 1.2

## 2016-01-16 SURGICAL SUPPLY — 47 items
BAG DECANTER FOR FLEXI CONT (MISCELLANEOUS) ×4 IMPLANT
BALLN LUTONIX 6X150X130 (BALLOONS) ×2
BALLN MUSTANG 5.0X40 135 (BALLOONS)
BALLN MUSTANG 6.0X40 75 (BALLOONS) ×2
BALLN MUSTANG 7.0X40 75 (BALLOONS) ×2
BALLN MUSTANG 9X40X75 (BALLOONS) ×2
BALLOON LUTONIX 6X150X130 (BALLOONS) ×1 IMPLANT
BALLOON MUSTANG 5.0X40 135 (BALLOONS) IMPLANT
BALLOON MUSTANG 6.0X40 75 (BALLOONS) ×1 IMPLANT
BALLOON MUSTANG 7.0X40 75 (BALLOONS) ×1 IMPLANT
BALLOON MUSTANG 9X40X75 (BALLOONS) ×1 IMPLANT
CHLORAPREP W/TINT 26ML (MISCELLANEOUS) ×4 IMPLANT
COVER LIGHT HANDLE STERIS (MISCELLANEOUS) ×4 IMPLANT
DECANTER SPIKE VIAL GLASS SM (MISCELLANEOUS) ×6 IMPLANT
DEVICE INFLATION ENCORE 26 (MISCELLANEOUS) ×2 IMPLANT
DRAPE C-ARM FOLDED MOBILE STRL (DRAPES) ×2 IMPLANT
DRAPE PROXIMA HALF (DRAPES) ×2 IMPLANT
DRSG TEGADERM 2-3/8X2-3/4 SM (GAUZE/BANDAGES/DRESSINGS) ×4 IMPLANT
ELECT REM PT RETURN 9FT ADLT (ELECTROSURGICAL) ×2
ELECTRODE REM PT RTRN 9FT ADLT (ELECTROSURGICAL) ×1 IMPLANT
GLOVE BIOGEL PI IND STRL 7.0 (GLOVE) ×4 IMPLANT
GLOVE BIOGEL PI IND STRL 7.5 (GLOVE) ×1 IMPLANT
GLOVE BIOGEL PI INDICATOR 7.0 (GLOVE) ×4
GLOVE BIOGEL PI INDICATOR 7.5 (GLOVE) ×1
GLOVE ECLIPSE 6.5 STRL STRAW (GLOVE) ×2 IMPLANT
GLOVE ECLIPSE 7.0 STRL STRAW (GLOVE) ×2 IMPLANT
GOWN STRL REUS W/ TWL LRG LVL3 (GOWN DISPOSABLE) ×3 IMPLANT
GOWN STRL REUS W/TWL LRG LVL3 (GOWN DISPOSABLE) ×3
GUIDEWIRE ANGLED .035 180CM (WIRE) ×2 IMPLANT
IV NS 500ML (IV SOLUTION) ×1
IV NS 500ML BAXH (IV SOLUTION) ×1 IMPLANT
KIT BLADEGUARD II DBL (SET/KITS/TRAYS/PACK) ×2 IMPLANT
KIT ROOM TURNOVER APOR (KITS) ×2 IMPLANT
MICROPUNCTURE 4FR NT-U-SST (SHEATH) ×2
NEEDLE HYPO 18GX1.5 BLUNT FILL (NEEDLE) ×2 IMPLANT
NEEDLE HYPO 25X1 1.5 SAFETY (NEEDLE) ×2 IMPLANT
PACK CV ACCESS (CUSTOM PROCEDURE TRAY) ×2 IMPLANT
PAD ARMBOARD 7.5X6 YLW CONV (MISCELLANEOUS) ×4 IMPLANT
SET BASIN LINEN APH (SET/KITS/TRAYS/PACK) ×2 IMPLANT
SET MICROPUNCTURE 4FR NT-U-SST (SHEATH) ×1 IMPLANT
SHEATH PINNACLE R/O II 6F 4CM (SHEATH) ×2 IMPLANT
SPONGE GAUZE 2X2 8PLY STRL LF (GAUZE/BANDAGES/DRESSINGS) ×2 IMPLANT
SUT MNCRL AB 4-0 PS2 18 (SUTURE) ×2 IMPLANT
SYR 20CC LL (SYRINGE) ×2 IMPLANT
SYR 5ML LL (SYRINGE) ×2 IMPLANT
SYR CONTROL 10ML LL (SYRINGE) ×2 IMPLANT
SYRINGE 10CC LL (SYRINGE) ×4 IMPLANT

## 2016-01-16 NOTE — H&P (Signed)
SURGICAL HISTORY & PHYSICAL  HISTORY OF PRESENT ILLNESS (HPI):  60 y.o. female with ESRD attributed to diabetes mellitus and HTN on chronic hemodialysis via RUE AV fistula presented with high venous return pressures and pulsatility of RUE AV fistula. Patient denies upper extremity swelling, pain, or prior interventions/revision. She also denies any recent CP, SOB, palpitations, or fever/chills.  PAST MEDICAL HISTORY (PMH):  Past Medical History:  Diagnosis Date  . Anemia   . Arthritis   . Asthma    in the past  . CHF (congestive heart failure) ([place])   . Chronic kidney disease    Renal failure, dialysis on M/W/F  . 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 End stage renal disease ([place])   . End stage renal disease on dialysis ([place])   . Hypertension   . Neuromuscular disorder ([place])    neuropathy    Reviewed. Otherwise negative.   PAST SURGICAL HISTORY ([place]):  Past Surgical History:  Procedure Laterality Date  . AV FISTULA PLACEMENT Right 08/25/2014   Procedure: RIGHT BRACHIOCEPHALIC ARTERIOVENOUS (AV) FISTULA CREATION;  Surgeon: Mal Misty, MD;  Location: [facility];  Service: Vascular;  Laterality: Right;  . FISTULA SUPERFICIALIZATION Right 11/03/2014   Procedure: RIGHT UPPER ARM FISTULA SUPERFICIALIZATION;  Surgeon: Mal Misty, MD;  Location: [facility];  Service: Vascular;  Laterality: Right;  . INSERTION OF DIALYSIS CATHETER     X4  . LIGATION OF COMPETING BRANCHES OF ARTERIOVENOUS FISTULA Right 11/03/2014   Procedure: LIGATION OF COMPETING BRANCHE x1 OF RIGHT UPPER ARM ARTERIOVENOUS FISTULA;  Surgeon: Mal Misty, MD;  Location: [facility];  Service: Vascular;  Laterality: Right;  . TUBAL LIGATION      Reviewed. Otherwise negative.   MEDICATIONS:  Prior to Admission medications   Medication Sig Start Date End Date Taking? Authorizing Provider  acetaminophen (TYLENOL) 500 MG tablet Take 500 mg by mouth every 8 (eight) hours as needed for mild pain.   Yes Historical  Provider, MD  amLODipine (NORVASC) 10 MG tablet Take 10 mg by mouth daily.   Yes Historical Provider, MD  atorvastatin (LIPITOR) 40 MG tablet Take 1 tablet (40 mg total) by mouth daily. 07/26/15  Yes Claretta Fraise, MD  diphenhydrAMINE (BENADRYL) 25 mg capsule Take 25 mg by mouth every 8 (eight) hours as needed for itching.   Yes Historical Provider, MD  ergocalciferol (VITAMIN D2) 50000 UNITS capsule Take 50,000 Units by mouth once a week.   Yes Historical Provider, MD  furosemide (LASIX) 40 MG tablet Take 40 mg by mouth daily.   Yes Historical Provider, MD  Insulin Glargine (TOUJEO SOLOSTAR) 300 UNIT/ML SOPN Inject 80 Units into the skin daily. 07/26/15  Yes Claretta Fraise, MD  metoprolol succinate (TOPROL-XL) 100 MG 24 hr tablet Take 100 mg by mouth daily. Take with or immediately following a meal.   Yes Historical Provider, MD  OXYGEN Inhale 2 L into the lungs at bedtime. As needed   Yes Historical Provider, MD  sevelamer carbonate (RENVELA) 800 MG tablet Take 800 mg by mouth 3 (three) times daily with meals. Takes 3 pills with meals, takes 2 pills with snacks   Yes Historical Provider, MD     ALLERGIES:  No Known Allergies   SOCIAL HISTORY:  Social History   Social History  . Marital status: Married    Spouse name: N/A  . Number of children: N/A  . Years of education: N/A   Occupational History  . Not on  file.   Social History Main Topics  . Smoking status: Former Smoker    Types: Cigarettes    Quit date: 07/25/1984  . Smokeless tobacco: Never Used  . Alcohol use No  . Drug use: No  . Sexual activity: Not on file   Other Topics Concern  . Not on file   Social History Narrative  . No narrative on file    FAMILY HISTORY:  Family History  Problem Relation Age of Onset  . Diabetes Mother   . Hypertension Mother   . Cancer Father   . Diabetes Father   . Hypertension Sister   . Diabetes Daughter   . Heart disease Daughter   . Hypertension Daughter   . Diabetes Son   .  Heart disease Son   . Hypertension Son   . Peripheral vascular disease Son     amputation    Otherwise negative.   REVIEW OF SYSTEMS:  Constitutional: denies any other weight loss, fever, chills, or sweats  Eyes: denies any other vision changes, history of eye injury  ENT: denies sore throat, hearing problems  Respiratory: denies shortness of breath, wheezing  Cardiovascular: denies chest pain, palpitations  Gastrointestinal: denies N/V, diarrhea  Musculoskeletal: denies any other joint pains or cramps  Skin: Denies any other rashes or skin discolorations  Neurological: denies any other headache, dizziness, weakness  Psychiatric: denies any other depression, anxiety   All other review of systems were otherwise negative.  VITAL SIGNS:  Temp:  [97.8 F (36.6 C)] 97.8 F (36.6 C) (09/19 0711) Pulse Rate:  [72] 72 (09/19 0711) Resp:  [18] 18 (09/19 0711) BP: (180)/(72) 180/72 (09/19 0711) SpO2:  [92 %] 92 % (09/19 0711) Weight:  [117.9 kg (260 lb)] 117.9 kg (260 lb) (09/19 0711)     Height: 5\' 4"  (162.6 cm) Weight: 117.9 kg (260 lb) BMI (Calculated): 44.7   INTAKE/OUTPUT:  This shift: No intake/output data recorded.  Last 2 shifts: @IOLAST2SHIFTS @  PHYSICAL EXAM:  Constitutional:  -- Normal body habitus  -- Awake, alert, and oriented x3  Eyes:  -- Pupils equally round and reactive to light  -- No scleral icterus  Ear, nose, throat:  -- No jugular venous distension  Pulmonary:  -- No crackles  -- Equal breath sounds bilaterally -- Breathing non-labored at rest Cardiovascular:  -- S1, S2 present  -- No pericardial rubs  Gastrointestinal:  -- Abdomen soft, nontender, nondistended, no guarding/rebound  -- No abdominal masses appreciated, pulsatile or otherwise  Musculoskeletal / Integumentary:  -- Wounds or skin discoloration: None -- Extremities: B/L UE and LE FROM, hands and feet warm, no edema -- Right arm well-healed AV fistula pulsatile up to just below axilla,  then non-pulsatile thrill in axilla Neurologic:  -- Motor function: Intact and symmetric -- Sensation: Intact and symmetric  Pulse/Doppler Exam: (p=palpable; d=doppler signals; 0=none)    Right   Left   AVF pulsatile Right arm AV fistula up to just below axilla, then non-pulsatile thrill in axilla  Brach  p   p   Rad  p   p   Labs:  CBC:  Lab Results  Component Value Date   WBC 10.0 07/25/2015   RBC 4.24 07/25/2015   HEMOGLOBIN 11.8 07/25/2015   BMP:  Lab Results  Component Value Date   GLUCOSE 228 (H) 07/25/2015   GLUCOSE 214 (H) 11/03/2014   CO2 23 07/25/2015   BUN 24 07/25/2015   CREATININE 5.36 (>) 07/25/2015   CALCIUM 9.4 07/25/2015  Assessment/Plan: (ICD-10's: 18.6) 60 y.o. 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    - NPO for procedure today  - all risks, benefits, and alternatives to above elective procedure discussed with patient, and informed consent was obtained and documented  - Right upper extremity fistulogram with possible intervention today   - surgical outpatient follow-up 2 weeks after procedure  All of the above findings and recommendations were discussed with the patient , and all of her questions were answered to her expressed satisfaction.  -- Marilynne Drivers Rosana Hoes, MD, [place]: [facility] Office: [phone]

## 2016-01-16 NOTE — Op Note (Signed)
SURGICAL ANGIOGRAPY/OPERATIVE REPORT   DATE OF PROCEDURE: 01/16/2016   ATTENDING SURGEON: Corene Cornea E. Rosana Hoes, MD  ANESTHESIA: Local  PRE-OPERATIVE DIAGNOSIS: Right upper extremity transposed brachial-basilic AV fistula dysfunction with high venous return pressures and fistula pulsatility (icd-10's: T82.858D)  POST-OPERATIVE DIAGNOSIS: Right upper extremity transposed brachial-basilic AV fistula dysfunction with high venous return pressures and fistula pulsatility (icd-10's: T82.858D)  PROCEDURE(S): (cpt: 99371) 1.) Percutaneous antegrade access of RUE transposed brachial-basilic AV fistula  2.) AV fistulogram with and without compression  3.) Central venogram  4.) Angioplasty of proximal Right arm basilic vein fistula outflow (towards shoulder) with 6 mm x 40 mm [place] angioplasty balloon  5.) Angioplasty of proximal Right arm basilic vein fistula outflow (towards shoulder) with 7 mm x 40 mm [place] angioplasty balloon 6.) Angioplasty of proximal Right arm basilic vein fistula outflow (towards shoulder) with 9 mm x 40 mm [place] angioplasty balloon, insufflated to 8 mm 7.) Deployment of 5 mm Lutonix drug-coated angioplasty balloon to proximal Right arm basilic vein fistula outflow (towards shoulder), insufflated to ~6.3 mm 8.) Limited RUE arteriogram and AV fistula anastamotic angiogram   INTRAOPERATIVE FINDINGS: Focal 70-80% stenosis of proximal arm basilic venous AV fistula outflow relatively refractory to standard sequential balloon angioplasties, ~30-40% residual stenosis with noticeably less pulsatility of RUE transposed brachial-basilic AVF; otherwise widely patent brachial-basilic anastomosis, basilic vein, and central venous outflow  INTRAOPERATIVE FLUIDS: 200 mL crystalloid and 36 mL contrast used   FLUOROSCOPY: 3.4 minutes and 23.16 mGy   ESTIMATED BLOOD LOSS: Minimal (<20 mL)   SPECIMENS: None   IMPLANTS: None   DRAINS: None   COMPLICATIONS: None apparent   CONDITION  AT COMPLETION: Hemodynamically stable, awake   PULSE/DOPPLER EXAM AT END OF PROCEDURE:   (p=palpable; d=doppler signals; 0=none)      Right   AVF  easily palpable less pulsatile thrill   Brach    p   Rad    p   DISPOSITION: Recovery   INDICATION(S) FOR PROCEDURE:  Patient is a 60 y.o. female with ESRD on Monday/Wednesday/Friday hemodialysis via RUE brachial-basilic AV fistula transposed by Dr. Kellie Simmering on 11/03/2014. Due to high venous return pressures during hemodialysis without reported prolonged bleeding, patient was referred for angiographic evaluation of AV fistula with possible intervention. All risks, benefits, and alternatives to above elective procedures were discussed with the patient, who elected to proceed, and informed consent was accordingly obtained at that time.   DETAILS OF PROCEDURE:  Patient was brought to the operative suite and appropriately identified. In supine position, access sites were prepped and draped in the usual sterile fashion, and following a brief timeout, percutaneous RUE AV fistula was accessed in antegrade fashion using Seldinger technique, by which local anesthetic was injected over the fistula venous access within 1 cm of the anastomosis, and micropuncture access needle was inserted into the fistula, through which micropuncture guidewire was advanced, over which the access needle was withdrawn, and micropuncture sheath was advanced and flushed with heparinized saline. Through the micropuncture sheath, RUE fistulogram was performed with visualization of Right proximal arm (towards shoulder) 70-80% venous outflow stenosis and no central venous outflow stenosis. Fistula was then compressed, and angiogram revealed a no stenosis at the anastomosis.   A soft angled glidewire was advanced through the AV fistula into its venous outflow and into the Right subclavian vein, over which the micropuncture sheath was removed and exchanged for a 62F x 4 cm sheath, which was  flushed with heparinized saline, and 3000 Units of heparin was administered  through the sheath, also through which the above-listed angioplasty balloons were advanced over the guidewire across the venous outflow stenosis and inflated with 30-40% residual stenosis on completion angiogram. To reduce subsequent restenosis considering this result, 5 mm Lutonix drug-coated angioplasty balloon was also similarly deployed and insufflated to ~6.3 mm with visualization of mild stenotic waist.  No further interventions were indicated at this time. A single 3-0 Monocryl U-stitch was placed in skin surrounding the sheath, which was withdrawn, and brief direct pressure was applied for hemostasis.   I was present for all aspects of the procedures, and there were no intraprocedural complications apparent.

## 2016-01-16 NOTE — Discharge Instructions (Signed)
In addition to included general post-operative instructions for Fistulogram with Fisultoplasty,  Diet: Resume home heart healthy Renal Diabetic diet.   Activity: No heavy lifting (children, pets, laundry) or strenuous activity today, but light activity and walking are encouraged. Keep arm straight x1 hour.   Wound care: May treat/remove dressing the same as a dialysis dressing and can remove either today or can be removed tomorrow at dialysis. There is a small absorbable suture that can be removed by dialysis nurse, but does not need to be removed, can also be removed at surgery follow-up.   Medications: Resume all home medications. For mild to moderate pain: acetaminophen (Tylenol).  Call office [phone]) at any time if any questions, worsening pain, fevers/chills, bleeding, drainage from incision site, or other concerns.

## 2016-01-17 DIAGNOSIS — E162 Hypoglycemia, unspecified: Secondary | ICD-10-CM | POA: Diagnosis not present

## 2016-01-17 DIAGNOSIS — D509 Iron deficiency anemia, unspecified: Secondary | ICD-10-CM | POA: Diagnosis not present

## 2016-01-17 DIAGNOSIS — Z992 Dependence on renal dialysis: Secondary | ICD-10-CM | POA: Diagnosis not present

## 2016-01-17 DIAGNOSIS — N2581 Secondary hyperparathyroidism of renal origin: Secondary | ICD-10-CM | POA: Diagnosis not present

## 2016-01-17 DIAGNOSIS — D631 Anemia in chronic kidney disease: Secondary | ICD-10-CM | POA: Diagnosis not present

## 2016-01-17 DIAGNOSIS — N186 End stage renal disease: Secondary | ICD-10-CM | POA: Diagnosis not present

## 2016-01-19 DIAGNOSIS — D631 Anemia in chronic kidney disease: Secondary | ICD-10-CM | POA: Diagnosis not present

## 2016-01-19 DIAGNOSIS — N186 End stage renal disease: Secondary | ICD-10-CM | POA: Diagnosis not present

## 2016-01-19 DIAGNOSIS — D509 Iron deficiency anemia, unspecified: Secondary | ICD-10-CM | POA: Diagnosis not present

## 2016-01-19 DIAGNOSIS — Z992 Dependence on renal dialysis: Secondary | ICD-10-CM | POA: Diagnosis not present

## 2016-01-19 DIAGNOSIS — N2581 Secondary hyperparathyroidism of renal origin: Secondary | ICD-10-CM | POA: Diagnosis not present

## 2016-01-19 DIAGNOSIS — E162 Hypoglycemia, unspecified: Secondary | ICD-10-CM | POA: Diagnosis not present

## 2016-01-22 DIAGNOSIS — Z992 Dependence on renal dialysis: Secondary | ICD-10-CM | POA: Diagnosis not present

## 2016-01-22 DIAGNOSIS — N186 End stage renal disease: Secondary | ICD-10-CM | POA: Diagnosis not present

## 2016-01-22 DIAGNOSIS — N2581 Secondary hyperparathyroidism of renal origin: Secondary | ICD-10-CM | POA: Diagnosis not present

## 2016-01-22 DIAGNOSIS — E119 Type 2 diabetes mellitus without complications: Secondary | ICD-10-CM | POA: Diagnosis not present

## 2016-01-22 DIAGNOSIS — Z794 Long term (current) use of insulin: Secondary | ICD-10-CM | POA: Diagnosis not present

## 2016-01-22 DIAGNOSIS — D631 Anemia in chronic kidney disease: Secondary | ICD-10-CM | POA: Diagnosis not present

## 2016-01-22 DIAGNOSIS — E162 Hypoglycemia, unspecified: Secondary | ICD-10-CM | POA: Diagnosis not present

## 2016-01-22 DIAGNOSIS — D509 Iron deficiency anemia, unspecified: Secondary | ICD-10-CM | POA: Diagnosis not present

## 2016-01-24 ENCOUNTER — Ambulatory Visit: Payer: Medicare Other | Admitting: Family Medicine

## 2016-01-24 DIAGNOSIS — D631 Anemia in chronic kidney disease: Secondary | ICD-10-CM | POA: Diagnosis not present

## 2016-01-24 DIAGNOSIS — Z992 Dependence on renal dialysis: Secondary | ICD-10-CM | POA: Diagnosis not present

## 2016-01-24 DIAGNOSIS — N186 End stage renal disease: Secondary | ICD-10-CM | POA: Diagnosis not present

## 2016-01-24 DIAGNOSIS — D509 Iron deficiency anemia, unspecified: Secondary | ICD-10-CM | POA: Diagnosis not present

## 2016-01-24 DIAGNOSIS — N2581 Secondary hyperparathyroidism of renal origin: Secondary | ICD-10-CM | POA: Diagnosis not present

## 2016-01-24 DIAGNOSIS — E162 Hypoglycemia, unspecified: Secondary | ICD-10-CM | POA: Diagnosis not present

## 2016-01-26 DIAGNOSIS — E162 Hypoglycemia, unspecified: Secondary | ICD-10-CM | POA: Diagnosis not present

## 2016-01-26 DIAGNOSIS — D631 Anemia in chronic kidney disease: Secondary | ICD-10-CM | POA: Diagnosis not present

## 2016-01-26 DIAGNOSIS — Z992 Dependence on renal dialysis: Secondary | ICD-10-CM | POA: Diagnosis not present

## 2016-01-26 DIAGNOSIS — N2581 Secondary hyperparathyroidism of renal origin: Secondary | ICD-10-CM | POA: Diagnosis not present

## 2016-01-26 DIAGNOSIS — N186 End stage renal disease: Secondary | ICD-10-CM | POA: Diagnosis not present

## 2016-01-26 DIAGNOSIS — D509 Iron deficiency anemia, unspecified: Secondary | ICD-10-CM | POA: Diagnosis not present

## 2016-01-27 DIAGNOSIS — N186 End stage renal disease: Secondary | ICD-10-CM | POA: Diagnosis not present

## 2016-01-27 DIAGNOSIS — Z992 Dependence on renal dialysis: Secondary | ICD-10-CM | POA: Diagnosis not present

## 2016-01-29 ENCOUNTER — Encounter (HOSPITAL_COMMUNITY): Payer: Self-pay | Admitting: Surgery

## 2016-01-29 DIAGNOSIS — D509 Iron deficiency anemia, unspecified: Secondary | ICD-10-CM | POA: Diagnosis not present

## 2016-01-29 DIAGNOSIS — N186 End stage renal disease: Secondary | ICD-10-CM | POA: Diagnosis not present

## 2016-01-29 DIAGNOSIS — N2581 Secondary hyperparathyroidism of renal origin: Secondary | ICD-10-CM | POA: Diagnosis not present

## 2016-01-29 DIAGNOSIS — Z992 Dependence on renal dialysis: Secondary | ICD-10-CM | POA: Diagnosis not present

## 2016-01-29 DIAGNOSIS — D631 Anemia in chronic kidney disease: Secondary | ICD-10-CM | POA: Diagnosis not present

## 2016-01-31 DIAGNOSIS — N186 End stage renal disease: Secondary | ICD-10-CM | POA: Diagnosis not present

## 2016-01-31 DIAGNOSIS — Z992 Dependence on renal dialysis: Secondary | ICD-10-CM | POA: Diagnosis not present

## 2016-01-31 DIAGNOSIS — D631 Anemia in chronic kidney disease: Secondary | ICD-10-CM | POA: Diagnosis not present

## 2016-01-31 DIAGNOSIS — N2581 Secondary hyperparathyroidism of renal origin: Secondary | ICD-10-CM | POA: Diagnosis not present

## 2016-01-31 DIAGNOSIS — D509 Iron deficiency anemia, unspecified: Secondary | ICD-10-CM | POA: Diagnosis not present

## 2016-02-01 ENCOUNTER — Ambulatory Visit: Payer: Medicare Other | Admitting: Family Medicine

## 2016-02-02 DIAGNOSIS — N2581 Secondary hyperparathyroidism of renal origin: Secondary | ICD-10-CM | POA: Diagnosis not present

## 2016-02-02 DIAGNOSIS — N186 End stage renal disease: Secondary | ICD-10-CM | POA: Diagnosis not present

## 2016-02-02 DIAGNOSIS — D509 Iron deficiency anemia, unspecified: Secondary | ICD-10-CM | POA: Diagnosis not present

## 2016-02-02 DIAGNOSIS — D631 Anemia in chronic kidney disease: Secondary | ICD-10-CM | POA: Diagnosis not present

## 2016-02-02 DIAGNOSIS — Z992 Dependence on renal dialysis: Secondary | ICD-10-CM | POA: Diagnosis not present

## 2016-02-05 DIAGNOSIS — D631 Anemia in chronic kidney disease: Secondary | ICD-10-CM | POA: Diagnosis not present

## 2016-02-05 DIAGNOSIS — N2581 Secondary hyperparathyroidism of renal origin: Secondary | ICD-10-CM | POA: Diagnosis not present

## 2016-02-05 DIAGNOSIS — D509 Iron deficiency anemia, unspecified: Secondary | ICD-10-CM | POA: Diagnosis not present

## 2016-02-05 DIAGNOSIS — Z992 Dependence on renal dialysis: Secondary | ICD-10-CM | POA: Diagnosis not present

## 2016-02-05 DIAGNOSIS — N186 End stage renal disease: Secondary | ICD-10-CM | POA: Diagnosis not present

## 2016-02-07 DIAGNOSIS — N186 End stage renal disease: Secondary | ICD-10-CM | POA: Diagnosis not present

## 2016-02-07 DIAGNOSIS — Z992 Dependence on renal dialysis: Secondary | ICD-10-CM | POA: Diagnosis not present

## 2016-02-07 DIAGNOSIS — D631 Anemia in chronic kidney disease: Secondary | ICD-10-CM | POA: Diagnosis not present

## 2016-02-07 DIAGNOSIS — N2581 Secondary hyperparathyroidism of renal origin: Secondary | ICD-10-CM | POA: Diagnosis not present

## 2016-02-07 DIAGNOSIS — D509 Iron deficiency anemia, unspecified: Secondary | ICD-10-CM | POA: Diagnosis not present

## 2016-02-09 DIAGNOSIS — D509 Iron deficiency anemia, unspecified: Secondary | ICD-10-CM | POA: Diagnosis not present

## 2016-02-09 DIAGNOSIS — N186 End stage renal disease: Secondary | ICD-10-CM | POA: Diagnosis not present

## 2016-02-09 DIAGNOSIS — D631 Anemia in chronic kidney disease: Secondary | ICD-10-CM | POA: Diagnosis not present

## 2016-02-09 DIAGNOSIS — Z992 Dependence on renal dialysis: Secondary | ICD-10-CM | POA: Diagnosis not present

## 2016-02-09 DIAGNOSIS — N2581 Secondary hyperparathyroidism of renal origin: Secondary | ICD-10-CM | POA: Diagnosis not present

## 2016-02-12 DIAGNOSIS — D509 Iron deficiency anemia, unspecified: Secondary | ICD-10-CM | POA: Diagnosis not present

## 2016-02-12 DIAGNOSIS — D631 Anemia in chronic kidney disease: Secondary | ICD-10-CM | POA: Diagnosis not present

## 2016-02-12 DIAGNOSIS — Z992 Dependence on renal dialysis: Secondary | ICD-10-CM | POA: Diagnosis not present

## 2016-02-12 DIAGNOSIS — N2581 Secondary hyperparathyroidism of renal origin: Secondary | ICD-10-CM | POA: Diagnosis not present

## 2016-02-12 DIAGNOSIS — N186 End stage renal disease: Secondary | ICD-10-CM | POA: Diagnosis not present

## 2016-02-14 DIAGNOSIS — D631 Anemia in chronic kidney disease: Secondary | ICD-10-CM | POA: Diagnosis not present

## 2016-02-14 DIAGNOSIS — Z992 Dependence on renal dialysis: Secondary | ICD-10-CM | POA: Diagnosis not present

## 2016-02-14 DIAGNOSIS — N186 End stage renal disease: Secondary | ICD-10-CM | POA: Diagnosis not present

## 2016-02-14 DIAGNOSIS — D509 Iron deficiency anemia, unspecified: Secondary | ICD-10-CM | POA: Diagnosis not present

## 2016-02-14 DIAGNOSIS — N2581 Secondary hyperparathyroidism of renal origin: Secondary | ICD-10-CM | POA: Diagnosis not present

## 2016-02-16 DIAGNOSIS — D631 Anemia in chronic kidney disease: Secondary | ICD-10-CM | POA: Diagnosis not present

## 2016-02-16 DIAGNOSIS — N186 End stage renal disease: Secondary | ICD-10-CM | POA: Diagnosis not present

## 2016-02-16 DIAGNOSIS — Z992 Dependence on renal dialysis: Secondary | ICD-10-CM | POA: Diagnosis not present

## 2016-02-16 DIAGNOSIS — D509 Iron deficiency anemia, unspecified: Secondary | ICD-10-CM | POA: Diagnosis not present

## 2016-02-16 DIAGNOSIS — N2581 Secondary hyperparathyroidism of renal origin: Secondary | ICD-10-CM | POA: Diagnosis not present

## 2016-02-19 DIAGNOSIS — N186 End stage renal disease: Secondary | ICD-10-CM | POA: Diagnosis not present

## 2016-02-19 DIAGNOSIS — Z992 Dependence on renal dialysis: Secondary | ICD-10-CM | POA: Diagnosis not present

## 2016-02-19 DIAGNOSIS — N2581 Secondary hyperparathyroidism of renal origin: Secondary | ICD-10-CM | POA: Diagnosis not present

## 2016-02-19 DIAGNOSIS — D509 Iron deficiency anemia, unspecified: Secondary | ICD-10-CM | POA: Diagnosis not present

## 2016-02-19 DIAGNOSIS — D631 Anemia in chronic kidney disease: Secondary | ICD-10-CM | POA: Diagnosis not present

## 2016-02-21 DIAGNOSIS — N186 End stage renal disease: Secondary | ICD-10-CM | POA: Diagnosis not present

## 2016-02-21 DIAGNOSIS — N2581 Secondary hyperparathyroidism of renal origin: Secondary | ICD-10-CM | POA: Diagnosis not present

## 2016-02-21 DIAGNOSIS — Z992 Dependence on renal dialysis: Secondary | ICD-10-CM | POA: Diagnosis not present

## 2016-02-21 DIAGNOSIS — D509 Iron deficiency anemia, unspecified: Secondary | ICD-10-CM | POA: Diagnosis not present

## 2016-02-21 DIAGNOSIS — D631 Anemia in chronic kidney disease: Secondary | ICD-10-CM | POA: Diagnosis not present

## 2016-02-23 DIAGNOSIS — D631 Anemia in chronic kidney disease: Secondary | ICD-10-CM | POA: Diagnosis not present

## 2016-02-23 DIAGNOSIS — N186 End stage renal disease: Secondary | ICD-10-CM | POA: Diagnosis not present

## 2016-02-23 DIAGNOSIS — N2581 Secondary hyperparathyroidism of renal origin: Secondary | ICD-10-CM | POA: Diagnosis not present

## 2016-02-23 DIAGNOSIS — Z992 Dependence on renal dialysis: Secondary | ICD-10-CM | POA: Diagnosis not present

## 2016-02-23 DIAGNOSIS — D509 Iron deficiency anemia, unspecified: Secondary | ICD-10-CM | POA: Diagnosis not present

## 2016-02-26 DIAGNOSIS — D631 Anemia in chronic kidney disease: Secondary | ICD-10-CM | POA: Diagnosis not present

## 2016-02-26 DIAGNOSIS — N2581 Secondary hyperparathyroidism of renal origin: Secondary | ICD-10-CM | POA: Diagnosis not present

## 2016-02-26 DIAGNOSIS — N186 End stage renal disease: Secondary | ICD-10-CM | POA: Diagnosis not present

## 2016-02-26 DIAGNOSIS — D509 Iron deficiency anemia, unspecified: Secondary | ICD-10-CM | POA: Diagnosis not present

## 2016-02-26 DIAGNOSIS — Z992 Dependence on renal dialysis: Secondary | ICD-10-CM | POA: Diagnosis not present

## 2016-02-27 DIAGNOSIS — N186 End stage renal disease: Secondary | ICD-10-CM | POA: Diagnosis not present

## 2016-02-27 DIAGNOSIS — Z992 Dependence on renal dialysis: Secondary | ICD-10-CM | POA: Diagnosis not present

## 2016-02-28 DIAGNOSIS — Z992 Dependence on renal dialysis: Secondary | ICD-10-CM | POA: Diagnosis not present

## 2016-02-28 DIAGNOSIS — N2581 Secondary hyperparathyroidism of renal origin: Secondary | ICD-10-CM | POA: Diagnosis not present

## 2016-02-28 DIAGNOSIS — D631 Anemia in chronic kidney disease: Secondary | ICD-10-CM | POA: Diagnosis not present

## 2016-02-28 DIAGNOSIS — N186 End stage renal disease: Secondary | ICD-10-CM | POA: Diagnosis not present

## 2016-02-28 DIAGNOSIS — D509 Iron deficiency anemia, unspecified: Secondary | ICD-10-CM | POA: Diagnosis not present

## 2016-03-01 DIAGNOSIS — N186 End stage renal disease: Secondary | ICD-10-CM | POA: Diagnosis not present

## 2016-03-01 DIAGNOSIS — N2581 Secondary hyperparathyroidism of renal origin: Secondary | ICD-10-CM | POA: Diagnosis not present

## 2016-03-01 DIAGNOSIS — Z992 Dependence on renal dialysis: Secondary | ICD-10-CM | POA: Diagnosis not present

## 2016-03-01 DIAGNOSIS — D631 Anemia in chronic kidney disease: Secondary | ICD-10-CM | POA: Diagnosis not present

## 2016-03-01 DIAGNOSIS — D509 Iron deficiency anemia, unspecified: Secondary | ICD-10-CM | POA: Diagnosis not present

## 2016-03-04 DIAGNOSIS — D509 Iron deficiency anemia, unspecified: Secondary | ICD-10-CM | POA: Diagnosis not present

## 2016-03-04 DIAGNOSIS — Z992 Dependence on renal dialysis: Secondary | ICD-10-CM | POA: Diagnosis not present

## 2016-03-04 DIAGNOSIS — D631 Anemia in chronic kidney disease: Secondary | ICD-10-CM | POA: Diagnosis not present

## 2016-03-04 DIAGNOSIS — N2581 Secondary hyperparathyroidism of renal origin: Secondary | ICD-10-CM | POA: Diagnosis not present

## 2016-03-04 DIAGNOSIS — N186 End stage renal disease: Secondary | ICD-10-CM | POA: Diagnosis not present

## 2016-03-08 DIAGNOSIS — Z992 Dependence on renal dialysis: Secondary | ICD-10-CM | POA: Diagnosis not present

## 2016-03-08 DIAGNOSIS — N186 End stage renal disease: Secondary | ICD-10-CM | POA: Diagnosis not present

## 2016-03-08 DIAGNOSIS — D509 Iron deficiency anemia, unspecified: Secondary | ICD-10-CM | POA: Diagnosis not present

## 2016-03-08 DIAGNOSIS — N2581 Secondary hyperparathyroidism of renal origin: Secondary | ICD-10-CM | POA: Diagnosis not present

## 2016-03-08 DIAGNOSIS — D631 Anemia in chronic kidney disease: Secondary | ICD-10-CM | POA: Diagnosis not present

## 2016-03-11 DIAGNOSIS — Z992 Dependence on renal dialysis: Secondary | ICD-10-CM | POA: Diagnosis not present

## 2016-03-11 DIAGNOSIS — D631 Anemia in chronic kidney disease: Secondary | ICD-10-CM | POA: Diagnosis not present

## 2016-03-11 DIAGNOSIS — N2581 Secondary hyperparathyroidism of renal origin: Secondary | ICD-10-CM | POA: Diagnosis not present

## 2016-03-11 DIAGNOSIS — D509 Iron deficiency anemia, unspecified: Secondary | ICD-10-CM | POA: Diagnosis not present

## 2016-03-11 DIAGNOSIS — N186 End stage renal disease: Secondary | ICD-10-CM | POA: Diagnosis not present

## 2016-03-13 DIAGNOSIS — D631 Anemia in chronic kidney disease: Secondary | ICD-10-CM | POA: Diagnosis not present

## 2016-03-13 DIAGNOSIS — Z992 Dependence on renal dialysis: Secondary | ICD-10-CM | POA: Diagnosis not present

## 2016-03-13 DIAGNOSIS — D509 Iron deficiency anemia, unspecified: Secondary | ICD-10-CM | POA: Diagnosis not present

## 2016-03-13 DIAGNOSIS — N186 End stage renal disease: Secondary | ICD-10-CM | POA: Diagnosis not present

## 2016-03-13 DIAGNOSIS — N2581 Secondary hyperparathyroidism of renal origin: Secondary | ICD-10-CM | POA: Diagnosis not present

## 2016-03-15 DIAGNOSIS — D509 Iron deficiency anemia, unspecified: Secondary | ICD-10-CM | POA: Diagnosis not present

## 2016-03-15 DIAGNOSIS — D631 Anemia in chronic kidney disease: Secondary | ICD-10-CM | POA: Diagnosis not present

## 2016-03-15 DIAGNOSIS — N186 End stage renal disease: Secondary | ICD-10-CM | POA: Diagnosis not present

## 2016-03-15 DIAGNOSIS — Z992 Dependence on renal dialysis: Secondary | ICD-10-CM | POA: Diagnosis not present

## 2016-03-15 DIAGNOSIS — N2581 Secondary hyperparathyroidism of renal origin: Secondary | ICD-10-CM | POA: Diagnosis not present

## 2016-03-18 DIAGNOSIS — N186 End stage renal disease: Secondary | ICD-10-CM | POA: Diagnosis not present

## 2016-03-18 DIAGNOSIS — N2581 Secondary hyperparathyroidism of renal origin: Secondary | ICD-10-CM | POA: Diagnosis not present

## 2016-03-18 DIAGNOSIS — Z992 Dependence on renal dialysis: Secondary | ICD-10-CM | POA: Diagnosis not present

## 2016-03-18 DIAGNOSIS — D631 Anemia in chronic kidney disease: Secondary | ICD-10-CM | POA: Diagnosis not present

## 2016-03-18 DIAGNOSIS — D509 Iron deficiency anemia, unspecified: Secondary | ICD-10-CM | POA: Diagnosis not present

## 2016-03-19 IMAGING — XA IR FLUORO GUIDE CV LINE*R*
1 series · 3 of 3 positions shown · non-contrast
Comparison: none

INDICATION: 59-year-old female with a history of renal failure.

[Series 1: run · 3 of 3 slices shown]
[im 1/3]
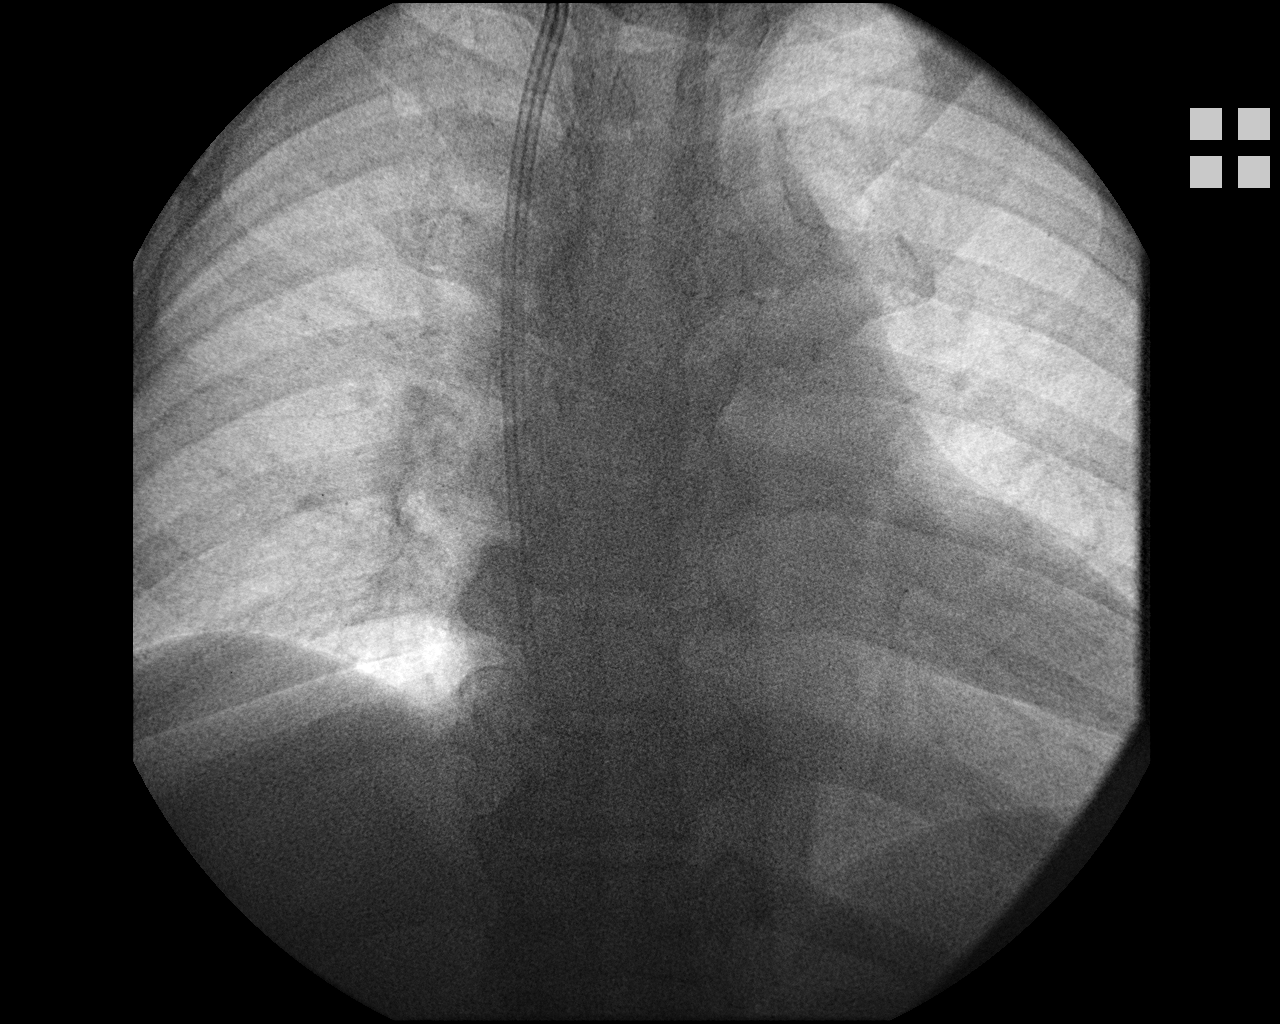
[im 2/3]
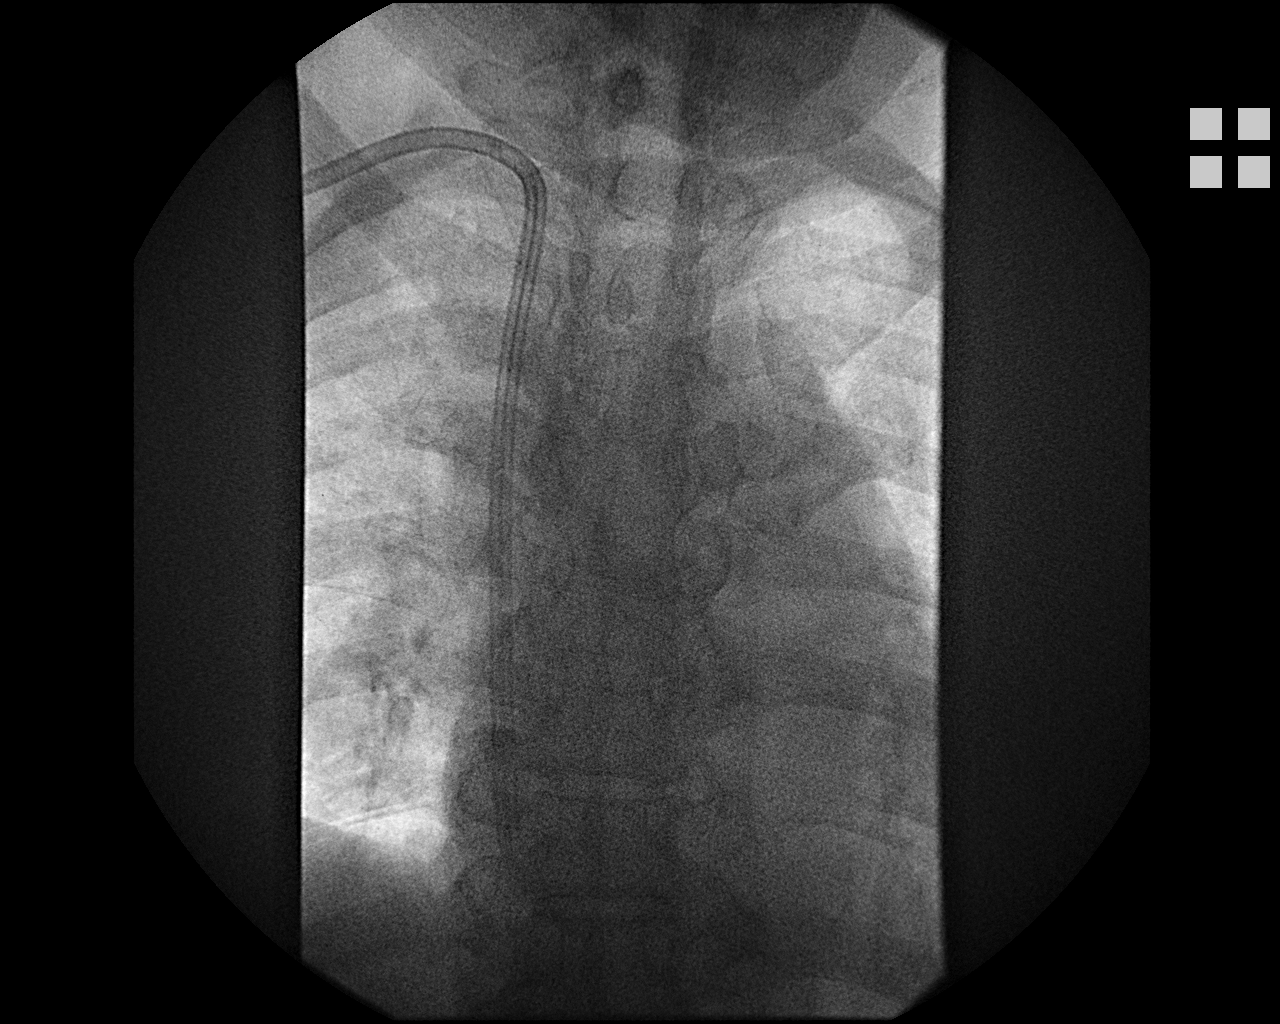
[im 3/3]
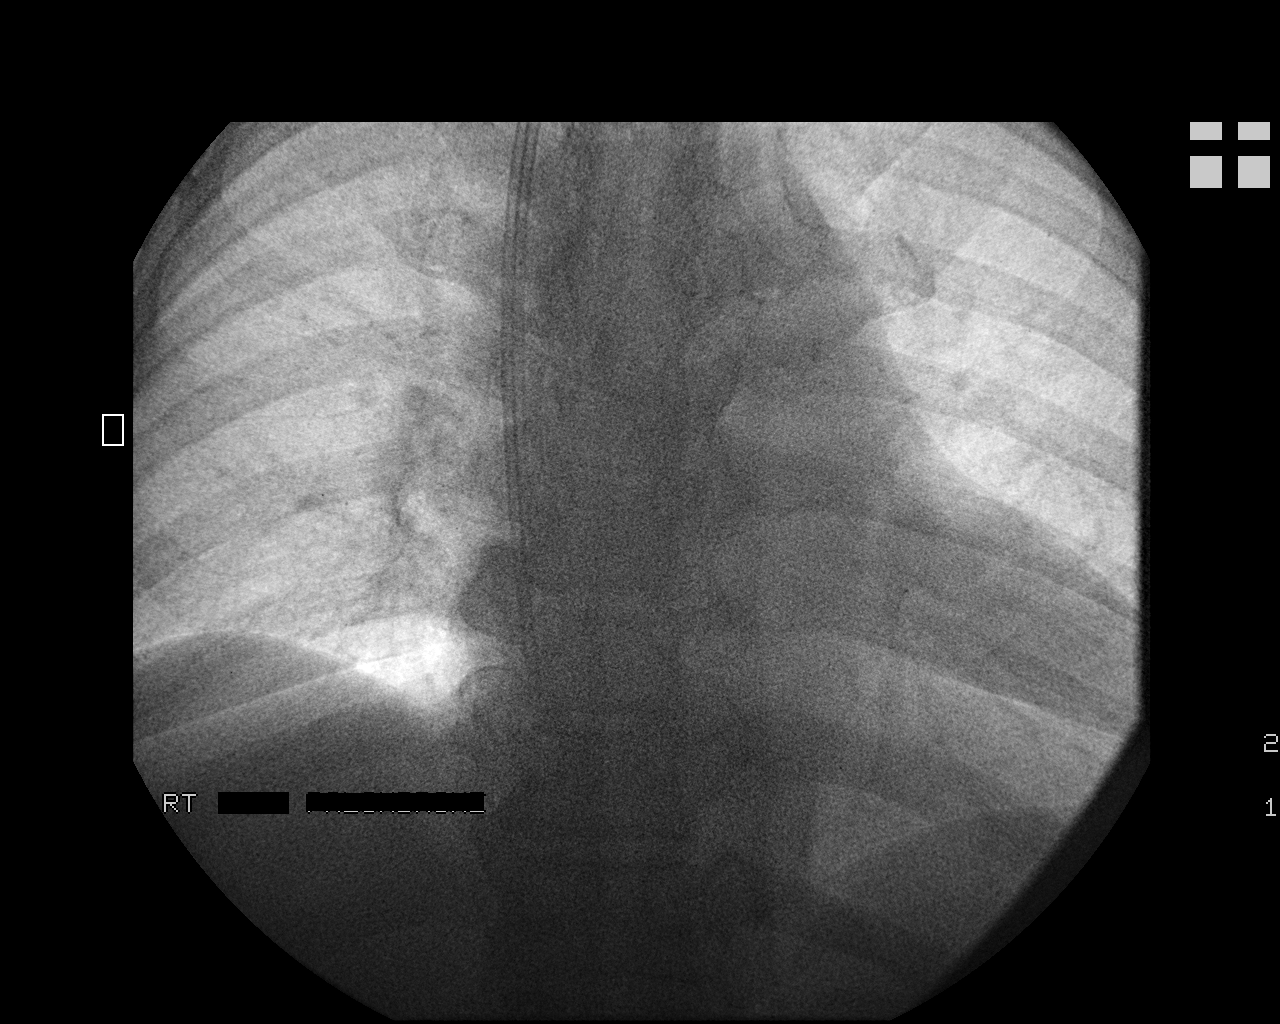

[3 of 3 positions shown; findings below may reference images not displayed]

She has had dialysis requirements since Friday May, 2014. She has
been receiving hemodialysis with catheters of the IJ vein. Current
catheter is not functional.

Right upper extremity fistula has been placed which is not being
used at this time.

EXAM:
TUNNELED CENTRAL VENOUS HEMODIALYSIS CATHETER REPLACEMENT WITH
FLUOROSCOPIC GUIDANCE

MEDICATIONS:
Ancef 2 gm IV; The IV antibiotic was given in an appropriate time
interval prior to skin puncture.

CONTRAST:  None

ANESTHESIA/SEDATION:
Versed 1.0 mg IV; Fentanyl 100 mcg IV

Total Moderate Sedation Time

Fifteen minutes.

FLUOROSCOPY TIME:  54 seconds 0 minutes

COMPLICATIONS:
None

PROCEDURE:
Informed written consent was obtained from the patient after a
discussion of the risks, benefits, and alternatives to treatment.
Questions regarding the procedure were encouraged and answered. The
right neck and chest as well as the indwelling catheter were prepped
with chlorhexidine in a sterile fashion, and a sterile drape was
applied covering the operative field. Maximum barrier sterile
technique with sterile gowns and gloves were used for the procedure.
A timeout was performed prior to the initiation of the procedure.

Both the red and blue hub in the indwelling catheter were aspirated
to clear clot and dwell.

Stiff Glidewire was advanced through the catheter into the IVC.
Generous infiltration of the skin and subcutaneous tissues
surrounding the indwelling catheter was performed with 1% lidocaine
without epinephrine.

Blunt dissection was used to dissect the catheter from the tract,
and then the catheter was removed over the wire. A new 19 cm tip to
cuff hemodialysis catheter was then placed over the stiff Glidewire.

Wire was removed and the tip of the catheter was confirmed at the
superior cavoatrial junction.

The catheter aspirates and flushes normally. The catheter was
flushed with appropriate volume heparin dwells.

The catheter was secured with silk sutures through the wings.
Dressings were applied. The patient tolerated the procedure well
without immediate post procedural complication.
IMPRESSION: Status post exchange of right IJ indwelling hemodialysis catheter
for a new, 19 cm tip to cuff hemodialysis catheter. Catheter ready
for use.

## 2016-03-20 DIAGNOSIS — D631 Anemia in chronic kidney disease: Secondary | ICD-10-CM | POA: Diagnosis not present

## 2016-03-20 DIAGNOSIS — N186 End stage renal disease: Secondary | ICD-10-CM | POA: Diagnosis not present

## 2016-03-20 DIAGNOSIS — Z992 Dependence on renal dialysis: Secondary | ICD-10-CM | POA: Diagnosis not present

## 2016-03-20 DIAGNOSIS — D509 Iron deficiency anemia, unspecified: Secondary | ICD-10-CM | POA: Diagnosis not present

## 2016-03-20 DIAGNOSIS — N2581 Secondary hyperparathyroidism of renal origin: Secondary | ICD-10-CM | POA: Diagnosis not present

## 2016-03-22 DIAGNOSIS — N186 End stage renal disease: Secondary | ICD-10-CM | POA: Diagnosis not present

## 2016-03-22 DIAGNOSIS — N2581 Secondary hyperparathyroidism of renal origin: Secondary | ICD-10-CM | POA: Diagnosis not present

## 2016-03-22 DIAGNOSIS — Z992 Dependence on renal dialysis: Secondary | ICD-10-CM | POA: Diagnosis not present

## 2016-03-22 DIAGNOSIS — D631 Anemia in chronic kidney disease: Secondary | ICD-10-CM | POA: Diagnosis not present

## 2016-03-22 DIAGNOSIS — D509 Iron deficiency anemia, unspecified: Secondary | ICD-10-CM | POA: Diagnosis not present

## 2016-03-25 DIAGNOSIS — Z992 Dependence on renal dialysis: Secondary | ICD-10-CM | POA: Diagnosis not present

## 2016-03-25 DIAGNOSIS — N186 End stage renal disease: Secondary | ICD-10-CM | POA: Diagnosis not present

## 2016-03-25 DIAGNOSIS — N2581 Secondary hyperparathyroidism of renal origin: Secondary | ICD-10-CM | POA: Diagnosis not present

## 2016-03-25 DIAGNOSIS — D509 Iron deficiency anemia, unspecified: Secondary | ICD-10-CM | POA: Diagnosis not present

## 2016-03-25 DIAGNOSIS — D631 Anemia in chronic kidney disease: Secondary | ICD-10-CM | POA: Diagnosis not present

## 2016-03-27 DIAGNOSIS — Z992 Dependence on renal dialysis: Secondary | ICD-10-CM | POA: Diagnosis not present

## 2016-03-27 DIAGNOSIS — N2581 Secondary hyperparathyroidism of renal origin: Secondary | ICD-10-CM | POA: Diagnosis not present

## 2016-03-27 DIAGNOSIS — N186 End stage renal disease: Secondary | ICD-10-CM | POA: Diagnosis not present

## 2016-03-27 DIAGNOSIS — D631 Anemia in chronic kidney disease: Secondary | ICD-10-CM | POA: Diagnosis not present

## 2016-03-27 DIAGNOSIS — D509 Iron deficiency anemia, unspecified: Secondary | ICD-10-CM | POA: Diagnosis not present

## 2016-03-28 DIAGNOSIS — N186 End stage renal disease: Secondary | ICD-10-CM | POA: Diagnosis not present

## 2016-03-28 DIAGNOSIS — Z992 Dependence on renal dialysis: Secondary | ICD-10-CM | POA: Diagnosis not present

## 2016-03-29 DIAGNOSIS — E162 Hypoglycemia, unspecified: Secondary | ICD-10-CM | POA: Diagnosis not present

## 2016-03-29 DIAGNOSIS — D631 Anemia in chronic kidney disease: Secondary | ICD-10-CM | POA: Diagnosis not present

## 2016-03-29 DIAGNOSIS — Z794 Long term (current) use of insulin: Secondary | ICD-10-CM | POA: Diagnosis not present

## 2016-03-29 DIAGNOSIS — Z992 Dependence on renal dialysis: Secondary | ICD-10-CM | POA: Diagnosis not present

## 2016-03-29 DIAGNOSIS — N186 End stage renal disease: Secondary | ICD-10-CM | POA: Diagnosis not present

## 2016-03-29 DIAGNOSIS — E11649 Type 2 diabetes mellitus with hypoglycemia without coma: Secondary | ICD-10-CM | POA: Diagnosis not present

## 2016-03-29 DIAGNOSIS — N2581 Secondary hyperparathyroidism of renal origin: Secondary | ICD-10-CM | POA: Diagnosis not present

## 2016-03-29 DIAGNOSIS — D509 Iron deficiency anemia, unspecified: Secondary | ICD-10-CM | POA: Diagnosis not present

## 2016-04-01 DIAGNOSIS — D631 Anemia in chronic kidney disease: Secondary | ICD-10-CM | POA: Diagnosis not present

## 2016-04-01 DIAGNOSIS — Z992 Dependence on renal dialysis: Secondary | ICD-10-CM | POA: Diagnosis not present

## 2016-04-01 DIAGNOSIS — N186 End stage renal disease: Secondary | ICD-10-CM | POA: Diagnosis not present

## 2016-04-01 DIAGNOSIS — D509 Iron deficiency anemia, unspecified: Secondary | ICD-10-CM | POA: Diagnosis not present

## 2016-04-01 DIAGNOSIS — N2581 Secondary hyperparathyroidism of renal origin: Secondary | ICD-10-CM | POA: Diagnosis not present

## 2016-04-01 DIAGNOSIS — E162 Hypoglycemia, unspecified: Secondary | ICD-10-CM | POA: Diagnosis not present

## 2016-04-03 DIAGNOSIS — N186 End stage renal disease: Secondary | ICD-10-CM | POA: Diagnosis not present

## 2016-04-03 DIAGNOSIS — Z992 Dependence on renal dialysis: Secondary | ICD-10-CM | POA: Diagnosis not present

## 2016-04-03 DIAGNOSIS — E162 Hypoglycemia, unspecified: Secondary | ICD-10-CM | POA: Diagnosis not present

## 2016-04-03 DIAGNOSIS — N2581 Secondary hyperparathyroidism of renal origin: Secondary | ICD-10-CM | POA: Diagnosis not present

## 2016-04-03 DIAGNOSIS — D631 Anemia in chronic kidney disease: Secondary | ICD-10-CM | POA: Diagnosis not present

## 2016-04-03 DIAGNOSIS — D509 Iron deficiency anemia, unspecified: Secondary | ICD-10-CM | POA: Diagnosis not present

## 2016-04-05 DIAGNOSIS — Z992 Dependence on renal dialysis: Secondary | ICD-10-CM | POA: Diagnosis not present

## 2016-04-05 DIAGNOSIS — N186 End stage renal disease: Secondary | ICD-10-CM | POA: Diagnosis not present

## 2016-04-05 DIAGNOSIS — N2581 Secondary hyperparathyroidism of renal origin: Secondary | ICD-10-CM | POA: Diagnosis not present

## 2016-04-05 DIAGNOSIS — D509 Iron deficiency anemia, unspecified: Secondary | ICD-10-CM | POA: Diagnosis not present

## 2016-04-05 DIAGNOSIS — D631 Anemia in chronic kidney disease: Secondary | ICD-10-CM | POA: Diagnosis not present

## 2016-04-05 DIAGNOSIS — E162 Hypoglycemia, unspecified: Secondary | ICD-10-CM | POA: Diagnosis not present

## 2016-04-08 DIAGNOSIS — N186 End stage renal disease: Secondary | ICD-10-CM | POA: Diagnosis not present

## 2016-04-08 DIAGNOSIS — Z992 Dependence on renal dialysis: Secondary | ICD-10-CM | POA: Diagnosis not present

## 2016-04-08 DIAGNOSIS — D631 Anemia in chronic kidney disease: Secondary | ICD-10-CM | POA: Diagnosis not present

## 2016-04-08 DIAGNOSIS — N2581 Secondary hyperparathyroidism of renal origin: Secondary | ICD-10-CM | POA: Diagnosis not present

## 2016-04-08 DIAGNOSIS — D509 Iron deficiency anemia, unspecified: Secondary | ICD-10-CM | POA: Diagnosis not present

## 2016-04-08 DIAGNOSIS — E162 Hypoglycemia, unspecified: Secondary | ICD-10-CM | POA: Diagnosis not present

## 2016-04-10 DIAGNOSIS — D631 Anemia in chronic kidney disease: Secondary | ICD-10-CM | POA: Diagnosis not present

## 2016-04-10 DIAGNOSIS — E162 Hypoglycemia, unspecified: Secondary | ICD-10-CM | POA: Diagnosis not present

## 2016-04-10 DIAGNOSIS — Z992 Dependence on renal dialysis: Secondary | ICD-10-CM | POA: Diagnosis not present

## 2016-04-10 DIAGNOSIS — N186 End stage renal disease: Secondary | ICD-10-CM | POA: Diagnosis not present

## 2016-04-10 DIAGNOSIS — N2581 Secondary hyperparathyroidism of renal origin: Secondary | ICD-10-CM | POA: Diagnosis not present

## 2016-04-10 DIAGNOSIS — D509 Iron deficiency anemia, unspecified: Secondary | ICD-10-CM | POA: Diagnosis not present

## 2016-04-12 DIAGNOSIS — D509 Iron deficiency anemia, unspecified: Secondary | ICD-10-CM | POA: Diagnosis not present

## 2016-04-12 DIAGNOSIS — E162 Hypoglycemia, unspecified: Secondary | ICD-10-CM | POA: Diagnosis not present

## 2016-04-12 DIAGNOSIS — D631 Anemia in chronic kidney disease: Secondary | ICD-10-CM | POA: Diagnosis not present

## 2016-04-12 DIAGNOSIS — N2581 Secondary hyperparathyroidism of renal origin: Secondary | ICD-10-CM | POA: Diagnosis not present

## 2016-04-12 DIAGNOSIS — N186 End stage renal disease: Secondary | ICD-10-CM | POA: Diagnosis not present

## 2016-04-12 DIAGNOSIS — Z992 Dependence on renal dialysis: Secondary | ICD-10-CM | POA: Diagnosis not present

## 2016-04-15 DIAGNOSIS — D509 Iron deficiency anemia, unspecified: Secondary | ICD-10-CM | POA: Diagnosis not present

## 2016-04-15 DIAGNOSIS — E162 Hypoglycemia, unspecified: Secondary | ICD-10-CM | POA: Diagnosis not present

## 2016-04-15 DIAGNOSIS — N186 End stage renal disease: Secondary | ICD-10-CM | POA: Diagnosis not present

## 2016-04-15 DIAGNOSIS — Z992 Dependence on renal dialysis: Secondary | ICD-10-CM | POA: Diagnosis not present

## 2016-04-15 DIAGNOSIS — N2581 Secondary hyperparathyroidism of renal origin: Secondary | ICD-10-CM | POA: Diagnosis not present

## 2016-04-15 DIAGNOSIS — D631 Anemia in chronic kidney disease: Secondary | ICD-10-CM | POA: Diagnosis not present

## 2016-04-16 ENCOUNTER — Encounter: Payer: Self-pay | Admitting: Family Medicine

## 2016-04-16 ENCOUNTER — Ambulatory Visit (INDEPENDENT_AMBULATORY_CARE_PROVIDER_SITE_OTHER): Payer: Medicare Other | Admitting: Family Medicine

## 2016-04-16 VITALS — BP 196/85 | HR 77 | Temp 97.6°F | Ht 64.0 in | Wt 256.0 lb

## 2016-04-16 DIAGNOSIS — N186 End stage renal disease: Secondary | ICD-10-CM

## 2016-04-16 DIAGNOSIS — Z992 Dependence on renal dialysis: Secondary | ICD-10-CM

## 2016-04-16 DIAGNOSIS — E1122 Type 2 diabetes mellitus with diabetic chronic kidney disease: Secondary | ICD-10-CM

## 2016-04-16 DIAGNOSIS — Z794 Long term (current) use of insulin: Secondary | ICD-10-CM | POA: Diagnosis not present

## 2016-04-16 LAB — BAYER DCA HB A1C WAIVED: HB A1C (BAYER DCA - WAIVED): 8.5 % — ABNORMAL HIGH (ref <7.0)

## 2016-04-16 NOTE — Progress Notes (Signed)
Subjective:  Patient ID: Christy Zhang, female    DOB: 1955/12/01  Age: 60 y.o. MRN: [id number]  CC: Diabetes   HPI Christy Zhang presents forFollow-up of diabetes. Patient checks blood sugar at home.   200 fasting and 200 postprandial Patient denies symptoms such as polyuria, polydipsia, excessive hunger, nausea No significant hypoglycemic spells noted. Medications as noted below. Taking them regularly without complication/adverse reaction being reported today. Checking feet daily.   History Christy Zhang has a past medical history of Anemia; Arthritis; Asthma; CHF (congestive heart failure) ([place]); Chronic kidney disease; Diabetes mellitus with complication ([place]); End stage renal disease ([place]); End stage renal disease on dialysis [facility]); Hypertension; and Neuromuscular disorder ([place]).   She has a past surgical history that includes Insertion of dialysis catheter; Tubal ligation; AV fistula placement (Right, 08/25/2014); Fistula superficialization (Right, 11/03/2014); Ligation of competing branches of arteriovenous fistula (Right, 11/03/2014); and Fistulogram (Right, 01/16/2016).   Her family history includes Cancer in her father; Diabetes in her daughter, father, mother, and son; Heart disease in her daughter and son; Hypertension in her daughter, mother, sister, and son; Peripheral vascular disease in her son.She reports that she quit smoking about 31 years ago. Her smoking use included Cigarettes. She has never used smokeless tobacco. She reports that she does not drink alcohol or use drugs.  Current Outpatient Prescriptions on File Prior to Visit  Medication Sig Dispense Refill  . acetaminophen (TYLENOL) 500 MG tablet Take 500 mg by mouth every 8 (eight) hours as needed for mild pain.    Marland Kitchen amLODipine (NORVASC) 10 MG tablet Take 10 mg by mouth daily.    Marland Kitchen atorvastatin (LIPITOR) 40 MG tablet Take 1 tablet (40 mg total) by mouth daily. 90 tablet 3  . diphenhydrAMINE (BENADRYL) 25 mg capsule  Take 25 mg by mouth every 8 (eight) hours as needed for itching.    . ergocalciferol (VITAMIN D2) 50000 UNITS capsule Take 50,000 Units by mouth once a week.    . metoprolol succinate (TOPROL-XL) 100 MG 24 hr tablet Take 100 mg by mouth daily. Take with or immediately following a meal.    . sevelamer carbonate (RENVELA) 800 MG tablet Take 800 mg by mouth 3 (three) times daily with meals. Takes 3 pills with meals, takes 2 pills with snacks    . OXYGEN Inhale 2 L into the lungs at bedtime. As needed     Current Facility-Administered Medications on File Prior to Visit  Medication Dose Route Frequency Provider Last Rate Last Dose  . 0.9 %  sodium chloride infusion   Intravenous Continuous Mal Misty, MD        ROS Review of Systems  Constitutional: Negative for activity change, appetite change and fever.  HENT: Negative for congestion, rhinorrhea and sore throat.   Eyes: Negative for visual disturbance.  Respiratory: Negative for cough and shortness of breath.   Cardiovascular: Negative for chest pain and palpitations.  Gastrointestinal: Negative for abdominal pain, diarrhea and nausea.  Genitourinary: Negative for dysuria.  Musculoskeletal: Negative for arthralgias and myalgias.    Objective:  BP (!) 196/85   Pulse 77   Temp 97.6 F (36.4 C) (Oral)   Ht '5\' 4"'$  (1.626 m)   Wt 256 lb (116.1 kg)   BMI 43.94 kg/m   BP Readings from Last 3 Encounters:  04/16/16 (!) 196/85  01/16/16 (!) 166/51  07/25/15 (!) 149/66    Wt Readings from Last 3 Encounters:  04/16/16 256 lb (116.1 kg)  01/16/16 260 lb (117.9  kg)  07/25/15 251 lb (113.9 kg)     Physical Exam  Constitutional: She is oriented to person, place, and time. She appears well-developed and well-nourished. No distress.  HENT:  Head: Normocephalic and atraumatic.  Right Ear: External ear normal.  Left Ear: External ear normal.  Nose: Nose normal.  Mouth/Throat: Oropharynx is clear and moist.  Eyes: Conjunctivae and  EOM are normal. Pupils are equal, round, and reactive to light.  Neck: Normal range of motion. Neck supple. No thyromegaly present.  Cardiovascular: Normal rate, regular rhythm and normal heart sounds.   No murmur heard. Pulmonary/Chest: Effort normal and breath sounds normal. No respiratory distress. She has no wheezes. She has no rales.  Abdominal: Soft. Bowel sounds are normal. She exhibits no distension. There is no tenderness.  Lymphadenopathy:    She has no cervical adenopathy.  Neurological: She is alert and oriented to person, place, and time. She has normal reflexes.  Skin: Skin is warm and dry.  Psychiatric: She has a normal mood and affect. Her behavior is normal. Judgment and thought content normal.    Lab Results  Component Value Date   HGBA1C 9.9 03/30/2015    Lab Results  Component Value Date   WBC 10.0 07/25/2015   HGB 13.6 01/16/2016   HCT 40.0 01/16/2016   PLT 507 (H) 07/25/2015   GLUCOSE 195 (H) 04/16/2016   CHOL 194 04/16/2016   TRIG 175 (H) 04/16/2016   HDL 49 04/16/2016   LDLCALC 110 (H) 04/16/2016   ALT 7 04/16/2016   AST 9 04/16/2016   NA 140 04/16/2016   K 4.7 04/16/2016   CL 95 (L) 04/16/2016   CREATININE 4.81 (>) 04/16/2016   BUN 28 (H) 04/16/2016   CO2 26 04/16/2016   HGBA1C 9.9 03/30/2015     Assessment & Plan:   Christy Zhang was seen today for diabetes.  Diagnoses and all orders for this visit:  Type 2 diabetes mellitus with chronic kidney disease on chronic dialysis, with long-term current use of insulin (HCC) -     Bayer DCA Hb A1c Waived -     Lipid panel -     CMP14+EGFR  End stage renal disease on dialysis (HCC) -     Bayer DCA Hb A1c Waived -     Lipid panel -     CMP14+EGFR    I have discontinued Christy Zhang's furosemide. I am also having her maintain her ergocalciferol, metoprolol succinate, amLODipine, diphenhydrAMINE, acetaminophen, OXYGEN, sevelamer carbonate, and atorvastatin.  No orders of the defined types were placed  in this encounter.    Follow-up: No Follow-up on file.  Claretta Fraise, M.D.

## 2016-04-17 ENCOUNTER — Other Ambulatory Visit: Payer: Self-pay | Admitting: Family Medicine

## 2016-04-17 DIAGNOSIS — D509 Iron deficiency anemia, unspecified: Secondary | ICD-10-CM | POA: Diagnosis not present

## 2016-04-17 DIAGNOSIS — E162 Hypoglycemia, unspecified: Secondary | ICD-10-CM | POA: Diagnosis not present

## 2016-04-17 DIAGNOSIS — E119 Type 2 diabetes mellitus without complications: Secondary | ICD-10-CM | POA: Diagnosis not present

## 2016-04-17 DIAGNOSIS — D631 Anemia in chronic kidney disease: Secondary | ICD-10-CM | POA: Diagnosis not present

## 2016-04-17 DIAGNOSIS — N186 End stage renal disease: Secondary | ICD-10-CM | POA: Diagnosis not present

## 2016-04-17 DIAGNOSIS — Z992 Dependence on renal dialysis: Secondary | ICD-10-CM | POA: Diagnosis not present

## 2016-04-17 DIAGNOSIS — Z794 Long term (current) use of insulin: Secondary | ICD-10-CM | POA: Diagnosis not present

## 2016-04-17 DIAGNOSIS — N2581 Secondary hyperparathyroidism of renal origin: Secondary | ICD-10-CM | POA: Diagnosis not present

## 2016-04-17 LAB — CMP14+EGFR
ALT: 7 IU/L (ref 0–32)
AST: 9 IU/L (ref 0–40)
Albumin/Globulin Ratio: 1 — ABNORMAL LOW (ref 1.2–2.2)
Albumin: 4 g/dL (ref 3.5–5.5)
Alkaline Phosphatase: 84 IU/L (ref 39–117)
BUN/Creatinine Ratio: 6 — ABNORMAL LOW (ref 9–23)
BUN: 28 mg/dL — ABNORMAL HIGH (ref 6–24)
Bilirubin Total: 0.5 mg/dL (ref 0.0–1.2)
CO2: 26 mmol/L (ref 18–29)
Calcium: 10 mg/dL (ref 8.7–10.2)
Chloride: 95 mmol/L — ABNORMAL LOW (ref 96–106)
Creatinine, Ser: 4.81 mg/dL (ref 0.57–1.00)
GFR calc Af Amer: 11 mL/min/{1.73_m2} — ABNORMAL LOW (ref >59)
GFR calc non Af Amer: 9 mL/min/{1.73_m2} — ABNORMAL LOW (ref >59)
Globulin, Total: 4.2 g/dL (ref 1.5–4.5)
Glucose: 195 mg/dL — ABNORMAL HIGH (ref 65–99)
Potassium: 4.7 mmol/L (ref 3.5–5.2)
Sodium: 140 mmol/L (ref 134–144)
Total Protein: 8.2 g/dL (ref 6.0–8.5)

## 2016-04-17 LAB — LIPID PANEL
Chol/HDL Ratio: 4 ratio units (ref 0.0–4.4)
Cholesterol, Total: 194 mg/dL (ref 100–199)
HDL: 49 mg/dL (ref >39)
LDL Calculated: 110 mg/dL — ABNORMAL HIGH (ref 0–99)
Triglycerides: 175 mg/dL — ABNORMAL HIGH (ref 0–149)
VLDL Cholesterol Cal: 35 mg/dL (ref 5–40)

## 2016-04-17 MED ORDER — INSULIN GLARGINE 300 UNIT/ML ~~LOC~~ SOPN: 100.0000 [IU] | PEN_INJECTOR | Freq: Every day | SUBCUTANEOUS | 11 refills | Status: DC

## 2016-04-19 DIAGNOSIS — Z992 Dependence on renal dialysis: Secondary | ICD-10-CM | POA: Diagnosis not present

## 2016-04-19 DIAGNOSIS — E162 Hypoglycemia, unspecified: Secondary | ICD-10-CM | POA: Diagnosis not present

## 2016-04-19 DIAGNOSIS — N2581 Secondary hyperparathyroidism of renal origin: Secondary | ICD-10-CM | POA: Diagnosis not present

## 2016-04-19 DIAGNOSIS — D509 Iron deficiency anemia, unspecified: Secondary | ICD-10-CM | POA: Diagnosis not present

## 2016-04-19 DIAGNOSIS — N186 End stage renal disease: Secondary | ICD-10-CM | POA: Diagnosis not present

## 2016-04-19 DIAGNOSIS — D631 Anemia in chronic kidney disease: Secondary | ICD-10-CM | POA: Diagnosis not present

## 2016-04-21 DIAGNOSIS — N2581 Secondary hyperparathyroidism of renal origin: Secondary | ICD-10-CM | POA: Diagnosis not present

## 2016-04-21 DIAGNOSIS — Z992 Dependence on renal dialysis: Secondary | ICD-10-CM | POA: Diagnosis not present

## 2016-04-21 DIAGNOSIS — E162 Hypoglycemia, unspecified: Secondary | ICD-10-CM | POA: Diagnosis not present

## 2016-04-21 DIAGNOSIS — D631 Anemia in chronic kidney disease: Secondary | ICD-10-CM | POA: Diagnosis not present

## 2016-04-21 DIAGNOSIS — N186 End stage renal disease: Secondary | ICD-10-CM | POA: Diagnosis not present

## 2016-04-21 DIAGNOSIS — D509 Iron deficiency anemia, unspecified: Secondary | ICD-10-CM | POA: Diagnosis not present

## 2016-04-23 ENCOUNTER — Telehealth: Payer: Self-pay | Admitting: Family Medicine

## 2016-04-24 ENCOUNTER — Telehealth: Payer: Self-pay | Admitting: Family Medicine

## 2016-04-24 DIAGNOSIS — Z992 Dependence on renal dialysis: Secondary | ICD-10-CM | POA: Diagnosis not present

## 2016-04-24 DIAGNOSIS — N2581 Secondary hyperparathyroidism of renal origin: Secondary | ICD-10-CM | POA: Diagnosis not present

## 2016-04-24 DIAGNOSIS — D631 Anemia in chronic kidney disease: Secondary | ICD-10-CM | POA: Diagnosis not present

## 2016-04-24 DIAGNOSIS — E162 Hypoglycemia, unspecified: Secondary | ICD-10-CM | POA: Diagnosis not present

## 2016-04-24 DIAGNOSIS — N186 End stage renal disease: Secondary | ICD-10-CM | POA: Diagnosis not present

## 2016-04-24 DIAGNOSIS — D509 Iron deficiency anemia, unspecified: Secondary | ICD-10-CM | POA: Diagnosis not present

## 2016-04-24 NOTE — Telephone Encounter (Signed)
Labs and office note 04-16-16 faxed to [facility] @ [place] [phone]

## 2016-04-26 DIAGNOSIS — N186 End stage renal disease: Secondary | ICD-10-CM | POA: Diagnosis not present

## 2016-04-26 DIAGNOSIS — N2581 Secondary hyperparathyroidism of renal origin: Secondary | ICD-10-CM | POA: Diagnosis not present

## 2016-04-26 DIAGNOSIS — E162 Hypoglycemia, unspecified: Secondary | ICD-10-CM | POA: Diagnosis not present

## 2016-04-26 DIAGNOSIS — D509 Iron deficiency anemia, unspecified: Secondary | ICD-10-CM | POA: Diagnosis not present

## 2016-04-26 DIAGNOSIS — D631 Anemia in chronic kidney disease: Secondary | ICD-10-CM | POA: Diagnosis not present

## 2016-04-26 DIAGNOSIS — Z992 Dependence on renal dialysis: Secondary | ICD-10-CM | POA: Diagnosis not present

## 2016-04-28 DIAGNOSIS — D631 Anemia in chronic kidney disease: Secondary | ICD-10-CM | POA: Diagnosis not present

## 2016-04-28 DIAGNOSIS — D509 Iron deficiency anemia, unspecified: Secondary | ICD-10-CM | POA: Diagnosis not present

## 2016-04-28 DIAGNOSIS — N186 End stage renal disease: Secondary | ICD-10-CM | POA: Diagnosis not present

## 2016-04-28 DIAGNOSIS — N2581 Secondary hyperparathyroidism of renal origin: Secondary | ICD-10-CM | POA: Diagnosis not present

## 2016-04-28 DIAGNOSIS — E162 Hypoglycemia, unspecified: Secondary | ICD-10-CM | POA: Diagnosis not present

## 2016-04-28 DIAGNOSIS — Z992 Dependence on renal dialysis: Secondary | ICD-10-CM | POA: Diagnosis not present

## 2016-05-01 DIAGNOSIS — Z992 Dependence on renal dialysis: Secondary | ICD-10-CM | POA: Diagnosis not present

## 2016-05-01 DIAGNOSIS — D509 Iron deficiency anemia, unspecified: Secondary | ICD-10-CM | POA: Diagnosis not present

## 2016-05-01 DIAGNOSIS — D631 Anemia in chronic kidney disease: Secondary | ICD-10-CM | POA: Diagnosis not present

## 2016-05-01 DIAGNOSIS — N2581 Secondary hyperparathyroidism of renal origin: Secondary | ICD-10-CM | POA: Diagnosis not present

## 2016-05-01 DIAGNOSIS — N186 End stage renal disease: Secondary | ICD-10-CM | POA: Diagnosis not present

## 2016-05-03 DIAGNOSIS — N186 End stage renal disease: Secondary | ICD-10-CM | POA: Diagnosis not present

## 2016-05-03 DIAGNOSIS — D509 Iron deficiency anemia, unspecified: Secondary | ICD-10-CM | POA: Diagnosis not present

## 2016-05-03 DIAGNOSIS — N2581 Secondary hyperparathyroidism of renal origin: Secondary | ICD-10-CM | POA: Diagnosis not present

## 2016-05-03 DIAGNOSIS — D631 Anemia in chronic kidney disease: Secondary | ICD-10-CM | POA: Diagnosis not present

## 2016-05-03 DIAGNOSIS — Z992 Dependence on renal dialysis: Secondary | ICD-10-CM | POA: Diagnosis not present

## 2016-05-06 DIAGNOSIS — N2581 Secondary hyperparathyroidism of renal origin: Secondary | ICD-10-CM | POA: Diagnosis not present

## 2016-05-06 DIAGNOSIS — D631 Anemia in chronic kidney disease: Secondary | ICD-10-CM | POA: Diagnosis not present

## 2016-05-06 DIAGNOSIS — D509 Iron deficiency anemia, unspecified: Secondary | ICD-10-CM | POA: Diagnosis not present

## 2016-05-06 DIAGNOSIS — Z992 Dependence on renal dialysis: Secondary | ICD-10-CM | POA: Diagnosis not present

## 2016-05-06 DIAGNOSIS — N186 End stage renal disease: Secondary | ICD-10-CM | POA: Diagnosis not present

## 2016-05-08 DIAGNOSIS — Z992 Dependence on renal dialysis: Secondary | ICD-10-CM | POA: Diagnosis not present

## 2016-05-08 DIAGNOSIS — N186 End stage renal disease: Secondary | ICD-10-CM | POA: Diagnosis not present

## 2016-05-08 DIAGNOSIS — D631 Anemia in chronic kidney disease: Secondary | ICD-10-CM | POA: Diagnosis not present

## 2016-05-08 DIAGNOSIS — D509 Iron deficiency anemia, unspecified: Secondary | ICD-10-CM | POA: Diagnosis not present

## 2016-05-08 DIAGNOSIS — N2581 Secondary hyperparathyroidism of renal origin: Secondary | ICD-10-CM | POA: Diagnosis not present

## 2016-05-10 DIAGNOSIS — Z992 Dependence on renal dialysis: Secondary | ICD-10-CM | POA: Diagnosis not present

## 2016-05-10 DIAGNOSIS — D509 Iron deficiency anemia, unspecified: Secondary | ICD-10-CM | POA: Diagnosis not present

## 2016-05-10 DIAGNOSIS — N2581 Secondary hyperparathyroidism of renal origin: Secondary | ICD-10-CM | POA: Diagnosis not present

## 2016-05-10 DIAGNOSIS — N186 End stage renal disease: Secondary | ICD-10-CM | POA: Diagnosis not present

## 2016-05-10 DIAGNOSIS — D631 Anemia in chronic kidney disease: Secondary | ICD-10-CM | POA: Diagnosis not present

## 2016-05-13 DIAGNOSIS — D509 Iron deficiency anemia, unspecified: Secondary | ICD-10-CM | POA: Diagnosis not present

## 2016-05-13 DIAGNOSIS — N2581 Secondary hyperparathyroidism of renal origin: Secondary | ICD-10-CM | POA: Diagnosis not present

## 2016-05-13 DIAGNOSIS — D631 Anemia in chronic kidney disease: Secondary | ICD-10-CM | POA: Diagnosis not present

## 2016-05-13 DIAGNOSIS — Z992 Dependence on renal dialysis: Secondary | ICD-10-CM | POA: Diagnosis not present

## 2016-05-13 DIAGNOSIS — N186 End stage renal disease: Secondary | ICD-10-CM | POA: Diagnosis not present

## 2016-05-17 DIAGNOSIS — D509 Iron deficiency anemia, unspecified: Secondary | ICD-10-CM | POA: Diagnosis not present

## 2016-05-17 DIAGNOSIS — N186 End stage renal disease: Secondary | ICD-10-CM | POA: Diagnosis not present

## 2016-05-17 DIAGNOSIS — Z992 Dependence on renal dialysis: Secondary | ICD-10-CM | POA: Diagnosis not present

## 2016-05-17 DIAGNOSIS — D631 Anemia in chronic kidney disease: Secondary | ICD-10-CM | POA: Diagnosis not present

## 2016-05-17 DIAGNOSIS — N2581 Secondary hyperparathyroidism of renal origin: Secondary | ICD-10-CM | POA: Diagnosis not present

## 2016-05-20 DIAGNOSIS — D509 Iron deficiency anemia, unspecified: Secondary | ICD-10-CM | POA: Diagnosis not present

## 2016-05-20 DIAGNOSIS — N2581 Secondary hyperparathyroidism of renal origin: Secondary | ICD-10-CM | POA: Diagnosis not present

## 2016-05-20 DIAGNOSIS — Z992 Dependence on renal dialysis: Secondary | ICD-10-CM | POA: Diagnosis not present

## 2016-05-20 DIAGNOSIS — D631 Anemia in chronic kidney disease: Secondary | ICD-10-CM | POA: Diagnosis not present

## 2016-05-20 DIAGNOSIS — N186 End stage renal disease: Secondary | ICD-10-CM | POA: Diagnosis not present

## 2016-05-22 DIAGNOSIS — N2581 Secondary hyperparathyroidism of renal origin: Secondary | ICD-10-CM | POA: Diagnosis not present

## 2016-05-22 DIAGNOSIS — N186 End stage renal disease: Secondary | ICD-10-CM | POA: Diagnosis not present

## 2016-05-22 DIAGNOSIS — Z992 Dependence on renal dialysis: Secondary | ICD-10-CM | POA: Diagnosis not present

## 2016-05-22 DIAGNOSIS — D509 Iron deficiency anemia, unspecified: Secondary | ICD-10-CM | POA: Diagnosis not present

## 2016-05-22 DIAGNOSIS — D631 Anemia in chronic kidney disease: Secondary | ICD-10-CM | POA: Diagnosis not present

## 2016-05-24 DIAGNOSIS — Z992 Dependence on renal dialysis: Secondary | ICD-10-CM | POA: Diagnosis not present

## 2016-05-24 DIAGNOSIS — N2581 Secondary hyperparathyroidism of renal origin: Secondary | ICD-10-CM | POA: Diagnosis not present

## 2016-05-24 DIAGNOSIS — D631 Anemia in chronic kidney disease: Secondary | ICD-10-CM | POA: Diagnosis not present

## 2016-05-24 DIAGNOSIS — N186 End stage renal disease: Secondary | ICD-10-CM | POA: Diagnosis not present

## 2016-05-24 DIAGNOSIS — D509 Iron deficiency anemia, unspecified: Secondary | ICD-10-CM | POA: Diagnosis not present

## 2016-05-27 DIAGNOSIS — D631 Anemia in chronic kidney disease: Secondary | ICD-10-CM | POA: Diagnosis not present

## 2016-05-27 DIAGNOSIS — Z992 Dependence on renal dialysis: Secondary | ICD-10-CM | POA: Diagnosis not present

## 2016-05-27 DIAGNOSIS — D509 Iron deficiency anemia, unspecified: Secondary | ICD-10-CM | POA: Diagnosis not present

## 2016-05-27 DIAGNOSIS — N2581 Secondary hyperparathyroidism of renal origin: Secondary | ICD-10-CM | POA: Diagnosis not present

## 2016-05-27 DIAGNOSIS — N186 End stage renal disease: Secondary | ICD-10-CM | POA: Diagnosis not present

## 2016-05-29 DIAGNOSIS — D631 Anemia in chronic kidney disease: Secondary | ICD-10-CM | POA: Diagnosis not present

## 2016-05-29 DIAGNOSIS — N2581 Secondary hyperparathyroidism of renal origin: Secondary | ICD-10-CM | POA: Diagnosis not present

## 2016-05-29 DIAGNOSIS — Z992 Dependence on renal dialysis: Secondary | ICD-10-CM | POA: Diagnosis not present

## 2016-05-29 DIAGNOSIS — N186 End stage renal disease: Secondary | ICD-10-CM | POA: Diagnosis not present

## 2016-05-29 DIAGNOSIS — D509 Iron deficiency anemia, unspecified: Secondary | ICD-10-CM | POA: Diagnosis not present

## 2016-05-31 DIAGNOSIS — D631 Anemia in chronic kidney disease: Secondary | ICD-10-CM | POA: Diagnosis not present

## 2016-05-31 DIAGNOSIS — N2581 Secondary hyperparathyroidism of renal origin: Secondary | ICD-10-CM | POA: Diagnosis not present

## 2016-05-31 DIAGNOSIS — Z992 Dependence on renal dialysis: Secondary | ICD-10-CM | POA: Diagnosis not present

## 2016-05-31 DIAGNOSIS — N186 End stage renal disease: Secondary | ICD-10-CM | POA: Diagnosis not present

## 2016-05-31 DIAGNOSIS — D509 Iron deficiency anemia, unspecified: Secondary | ICD-10-CM | POA: Diagnosis not present

## 2016-06-03 DIAGNOSIS — D509 Iron deficiency anemia, unspecified: Secondary | ICD-10-CM | POA: Diagnosis not present

## 2016-06-03 DIAGNOSIS — N186 End stage renal disease: Secondary | ICD-10-CM | POA: Diagnosis not present

## 2016-06-03 DIAGNOSIS — N2581 Secondary hyperparathyroidism of renal origin: Secondary | ICD-10-CM | POA: Diagnosis not present

## 2016-06-03 DIAGNOSIS — D631 Anemia in chronic kidney disease: Secondary | ICD-10-CM | POA: Diagnosis not present

## 2016-06-03 DIAGNOSIS — Z992 Dependence on renal dialysis: Secondary | ICD-10-CM | POA: Diagnosis not present

## 2016-06-05 DIAGNOSIS — D631 Anemia in chronic kidney disease: Secondary | ICD-10-CM | POA: Diagnosis not present

## 2016-06-05 DIAGNOSIS — Z992 Dependence on renal dialysis: Secondary | ICD-10-CM | POA: Diagnosis not present

## 2016-06-05 DIAGNOSIS — N2581 Secondary hyperparathyroidism of renal origin: Secondary | ICD-10-CM | POA: Diagnosis not present

## 2016-06-05 DIAGNOSIS — D509 Iron deficiency anemia, unspecified: Secondary | ICD-10-CM | POA: Diagnosis not present

## 2016-06-05 DIAGNOSIS — N186 End stage renal disease: Secondary | ICD-10-CM | POA: Diagnosis not present

## 2016-06-07 DIAGNOSIS — D509 Iron deficiency anemia, unspecified: Secondary | ICD-10-CM | POA: Diagnosis not present

## 2016-06-07 DIAGNOSIS — N2581 Secondary hyperparathyroidism of renal origin: Secondary | ICD-10-CM | POA: Diagnosis not present

## 2016-06-07 DIAGNOSIS — Z992 Dependence on renal dialysis: Secondary | ICD-10-CM | POA: Diagnosis not present

## 2016-06-07 DIAGNOSIS — N186 End stage renal disease: Secondary | ICD-10-CM | POA: Diagnosis not present

## 2016-06-07 DIAGNOSIS — D631 Anemia in chronic kidney disease: Secondary | ICD-10-CM | POA: Diagnosis not present

## 2016-06-10 DIAGNOSIS — D509 Iron deficiency anemia, unspecified: Secondary | ICD-10-CM | POA: Diagnosis not present

## 2016-06-10 DIAGNOSIS — N186 End stage renal disease: Secondary | ICD-10-CM | POA: Diagnosis not present

## 2016-06-10 DIAGNOSIS — N2581 Secondary hyperparathyroidism of renal origin: Secondary | ICD-10-CM | POA: Diagnosis not present

## 2016-06-10 DIAGNOSIS — Z992 Dependence on renal dialysis: Secondary | ICD-10-CM | POA: Diagnosis not present

## 2016-06-10 DIAGNOSIS — D631 Anemia in chronic kidney disease: Secondary | ICD-10-CM | POA: Diagnosis not present

## 2016-06-11 ENCOUNTER — Encounter: Payer: Self-pay | Admitting: Family Medicine

## 2016-06-11 ENCOUNTER — Ambulatory Visit (INDEPENDENT_AMBULATORY_CARE_PROVIDER_SITE_OTHER): Payer: Medicare Other | Admitting: Family Medicine

## 2016-06-11 VITALS — BP 130/68 | HR 74 | Temp 97.6°F | Ht 64.0 in | Wt 257.0 lb

## 2016-06-11 DIAGNOSIS — J111 Influenza due to unidentified influenza virus with other respiratory manifestations: Secondary | ICD-10-CM

## 2016-06-11 MED ORDER — OSELTAMIVIR PHOSPHATE 30 MG PO CAPS: ORAL_CAPSULE | ORAL | 0 refills | Status: DC

## 2016-06-11 NOTE — Progress Notes (Signed)
Subjective:  Patient ID: Christy Zhang, female    DOB: 1956-02-23  Age: 61 y.o. MRN: [id number]  CC: Cough (pt here today c/o cough and sore throat for the past x 4 days but she says it is getting better.)   HPI [facility] presents  with dry cough runny stuffy nose. Diffuse headache of moderate intensity. Patient also has chills and subjective fever. Body aches worst in the back but present in the legs, shoulders, and torso as well. Has sapped the energy to the point that of being unable to perform usual activities other than ADLs. Onset 4 days ago. Cough , 2 days ago.   History Christy Zhang has a past medical history of Anemia; Arthritis; Asthma; CHF (congestive heart failure) ([place]); Chronic kidney disease; Diabetes mellitus with complication ([place]); End stage renal disease ([place]); End stage renal disease on dialysis [facility]); Hypertension; and Neuromuscular disorder ([place]).   She has a past surgical history that includes Insertion of dialysis catheter; Tubal ligation; AV fistula placement (Right, 08/25/2014); Fistula superficialization (Right, 11/03/2014); Ligation of competing branches of arteriovenous fistula (Right, 11/03/2014); and Fistulogram (Right, 01/16/2016).   Her family history includes Cancer in her father; Diabetes in her daughter, father, mother, and son; Heart disease in her daughter and son; Hypertension in her daughter, mother, sister, and son; Peripheral vascular disease in her son.She reports that she quit smoking about 31 years ago. Her smoking use included Cigarettes. She has never used smokeless tobacco. She reports that she does not drink alcohol or use drugs.  Current Outpatient Prescriptions on File Prior to Visit  Medication Sig Dispense Refill  . acetaminophen (TYLENOL) 500 MG tablet Take 500 mg by mouth every 8 (eight) hours as needed for mild pain.    Marland Kitchen amLODipine (NORVASC) 10 MG tablet Take 10 mg by mouth daily.    Marland Kitchen atorvastatin (LIPITOR) 40 MG tablet Take 1 tablet (40  mg total) by mouth daily. 90 tablet 3  . diphenhydrAMINE (BENADRYL) 25 mg capsule Take 25 mg by mouth every 8 (eight) hours as needed for itching.    . ergocalciferol (VITAMIN D2) 50000 UNITS capsule Take 50,000 Units by mouth once a week.    . Insulin Glargine (TOUJEO SOLOSTAR) 300 UNIT/ML SOPN Inject 100 Units into the skin daily. 7 pen 11  . metoprolol succinate (TOPROL-XL) 100 MG 24 hr tablet Take 100 mg by mouth daily. Take with or immediately following a meal.    . sevelamer carbonate (RENVELA) 800 MG tablet Take 800 mg by mouth 3 (three) times daily with meals. Takes 3 pills with meals, takes 2 pills with snacks     Current Facility-Administered Medications on File Prior to Visit  Medication Dose Route Frequency Provider Last Rate Last Dose  . 0.9 %  sodium chloride infusion   Intravenous Continuous Mal Misty, MD        ROS Review of Systems  Constitutional: Positive for appetite change, chills and fever.  HENT: Positive for congestion and rhinorrhea. Negative for ear pain, nosebleeds, postnasal drip, sinus pressure and sore throat.   Respiratory: Negative for chest tightness and shortness of breath.   Cardiovascular: Negative for chest pain.  Musculoskeletal: Positive for myalgias.  Skin: Negative for rash.  Neurological: Positive for headaches.    Objective:  BP 130/68   Pulse 74   Temp 97.6 F (36.4 C) (Oral)   Ht 5\' 4"  (1.626 m)   Wt 257 lb (116.6 kg)   BMI 44.11 kg/m   Physical Exam  Constitutional: She appears well-developed and well-nourished.  HENT:  Head: Normocephalic and atraumatic.  Right Ear: Tympanic membrane and external ear normal. No decreased hearing is noted.  Left Ear: Tympanic membrane and external ear normal. No decreased hearing is noted.  Nose: Mucosal edema present. Right sinus exhibits no frontal sinus tenderness. Left sinus exhibits no frontal sinus tenderness.  Mouth/Throat: No oropharyngeal exudate or posterior oropharyngeal erythema.    Neck: No Brudzinski's sign noted.  Pulmonary/Chest: Breath sounds normal. No respiratory distress.  Lymphadenopathy:       Head (right side): No preauricular adenopathy present.       Head (left side): No preauricular adenopathy present.       Right cervical: No superficial cervical adenopathy present.      Left cervical: No superficial cervical adenopathy present.    Assessment & Plan:   Maree was seen today for cough.  Diagnoses and all orders for this visit:  Influenza with respiratory manifestation  Other orders -     oseltamivir (TAMIFLU) 30 MG capsule; 30 mg now, then after each dialysis X 5 days   I have discontinued Christy Zhang's OXYGEN. I am also having her start on oseltamivir. Additionally, I am having her maintain her ergocalciferol, metoprolol succinate, amLODipine, diphenhydrAMINE, acetaminophen, sevelamer carbonate, atorvastatin, and Insulin Glargine.  Meds ordered this encounter  Medications  . oseltamivir (TAMIFLU) 30 MG capsule    Sig: 30 mg now, then after each dialysis X 5 days    Dispense:  5 capsule    Refill:  0     Follow-up: Return if symptoms worsen or fail to improve.  Claretta Fraise, M.D.

## 2016-06-12 DIAGNOSIS — Z992 Dependence on renal dialysis: Secondary | ICD-10-CM | POA: Diagnosis not present

## 2016-06-12 DIAGNOSIS — N186 End stage renal disease: Secondary | ICD-10-CM | POA: Diagnosis not present

## 2016-06-12 DIAGNOSIS — N2581 Secondary hyperparathyroidism of renal origin: Secondary | ICD-10-CM | POA: Diagnosis not present

## 2016-06-12 DIAGNOSIS — D631 Anemia in chronic kidney disease: Secondary | ICD-10-CM | POA: Diagnosis not present

## 2016-06-12 DIAGNOSIS — D509 Iron deficiency anemia, unspecified: Secondary | ICD-10-CM | POA: Diagnosis not present

## 2016-06-14 DIAGNOSIS — D631 Anemia in chronic kidney disease: Secondary | ICD-10-CM | POA: Diagnosis not present

## 2016-06-14 DIAGNOSIS — N186 End stage renal disease: Secondary | ICD-10-CM | POA: Diagnosis not present

## 2016-06-14 DIAGNOSIS — N2581 Secondary hyperparathyroidism of renal origin: Secondary | ICD-10-CM | POA: Diagnosis not present

## 2016-06-14 DIAGNOSIS — D509 Iron deficiency anemia, unspecified: Secondary | ICD-10-CM | POA: Diagnosis not present

## 2016-06-14 DIAGNOSIS — Z992 Dependence on renal dialysis: Secondary | ICD-10-CM | POA: Diagnosis not present

## 2016-06-17 DIAGNOSIS — N2581 Secondary hyperparathyroidism of renal origin: Secondary | ICD-10-CM | POA: Diagnosis not present

## 2016-06-17 DIAGNOSIS — D509 Iron deficiency anemia, unspecified: Secondary | ICD-10-CM | POA: Diagnosis not present

## 2016-06-17 DIAGNOSIS — D631 Anemia in chronic kidney disease: Secondary | ICD-10-CM | POA: Diagnosis not present

## 2016-06-17 DIAGNOSIS — Z992 Dependence on renal dialysis: Secondary | ICD-10-CM | POA: Diagnosis not present

## 2016-06-17 DIAGNOSIS — N186 End stage renal disease: Secondary | ICD-10-CM | POA: Diagnosis not present

## 2016-06-19 DIAGNOSIS — Z992 Dependence on renal dialysis: Secondary | ICD-10-CM | POA: Diagnosis not present

## 2016-06-19 DIAGNOSIS — N2581 Secondary hyperparathyroidism of renal origin: Secondary | ICD-10-CM | POA: Diagnosis not present

## 2016-06-19 DIAGNOSIS — D631 Anemia in chronic kidney disease: Secondary | ICD-10-CM | POA: Diagnosis not present

## 2016-06-19 DIAGNOSIS — N186 End stage renal disease: Secondary | ICD-10-CM | POA: Diagnosis not present

## 2016-06-19 DIAGNOSIS — D509 Iron deficiency anemia, unspecified: Secondary | ICD-10-CM | POA: Diagnosis not present

## 2016-06-21 DIAGNOSIS — D509 Iron deficiency anemia, unspecified: Secondary | ICD-10-CM | POA: Diagnosis not present

## 2016-06-21 DIAGNOSIS — Z992 Dependence on renal dialysis: Secondary | ICD-10-CM | POA: Diagnosis not present

## 2016-06-21 DIAGNOSIS — N186 End stage renal disease: Secondary | ICD-10-CM | POA: Diagnosis not present

## 2016-06-21 DIAGNOSIS — N2581 Secondary hyperparathyroidism of renal origin: Secondary | ICD-10-CM | POA: Diagnosis not present

## 2016-06-21 DIAGNOSIS — D631 Anemia in chronic kidney disease: Secondary | ICD-10-CM | POA: Diagnosis not present

## 2016-06-24 DIAGNOSIS — D631 Anemia in chronic kidney disease: Secondary | ICD-10-CM | POA: Diagnosis not present

## 2016-06-24 DIAGNOSIS — Z992 Dependence on renal dialysis: Secondary | ICD-10-CM | POA: Diagnosis not present

## 2016-06-24 DIAGNOSIS — N186 End stage renal disease: Secondary | ICD-10-CM | POA: Diagnosis not present

## 2016-06-24 DIAGNOSIS — N2581 Secondary hyperparathyroidism of renal origin: Secondary | ICD-10-CM | POA: Diagnosis not present

## 2016-06-24 DIAGNOSIS — D509 Iron deficiency anemia, unspecified: Secondary | ICD-10-CM | POA: Diagnosis not present

## 2016-06-26 DIAGNOSIS — Z992 Dependence on renal dialysis: Secondary | ICD-10-CM | POA: Diagnosis not present

## 2016-06-26 DIAGNOSIS — D631 Anemia in chronic kidney disease: Secondary | ICD-10-CM | POA: Diagnosis not present

## 2016-06-26 DIAGNOSIS — D509 Iron deficiency anemia, unspecified: Secondary | ICD-10-CM | POA: Diagnosis not present

## 2016-06-26 DIAGNOSIS — N186 End stage renal disease: Secondary | ICD-10-CM | POA: Diagnosis not present

## 2016-06-26 DIAGNOSIS — N2581 Secondary hyperparathyroidism of renal origin: Secondary | ICD-10-CM | POA: Diagnosis not present

## 2016-06-28 DIAGNOSIS — N2581 Secondary hyperparathyroidism of renal origin: Secondary | ICD-10-CM | POA: Diagnosis not present

## 2016-06-28 DIAGNOSIS — Z992 Dependence on renal dialysis: Secondary | ICD-10-CM | POA: Diagnosis not present

## 2016-06-28 DIAGNOSIS — E11649 Type 2 diabetes mellitus with hypoglycemia without coma: Secondary | ICD-10-CM | POA: Diagnosis not present

## 2016-06-28 DIAGNOSIS — E162 Hypoglycemia, unspecified: Secondary | ICD-10-CM | POA: Diagnosis not present

## 2016-06-28 DIAGNOSIS — D631 Anemia in chronic kidney disease: Secondary | ICD-10-CM | POA: Diagnosis not present

## 2016-06-28 DIAGNOSIS — Z794 Long term (current) use of insulin: Secondary | ICD-10-CM | POA: Diagnosis not present

## 2016-06-28 DIAGNOSIS — D509 Iron deficiency anemia, unspecified: Secondary | ICD-10-CM | POA: Diagnosis not present

## 2016-06-28 DIAGNOSIS — N186 End stage renal disease: Secondary | ICD-10-CM | POA: Diagnosis not present

## 2016-07-01 DIAGNOSIS — D509 Iron deficiency anemia, unspecified: Secondary | ICD-10-CM | POA: Diagnosis not present

## 2016-07-01 DIAGNOSIS — E162 Hypoglycemia, unspecified: Secondary | ICD-10-CM | POA: Diagnosis not present

## 2016-07-01 DIAGNOSIS — Z992 Dependence on renal dialysis: Secondary | ICD-10-CM | POA: Diagnosis not present

## 2016-07-01 DIAGNOSIS — N186 End stage renal disease: Secondary | ICD-10-CM | POA: Diagnosis not present

## 2016-07-01 DIAGNOSIS — D631 Anemia in chronic kidney disease: Secondary | ICD-10-CM | POA: Diagnosis not present

## 2016-07-01 DIAGNOSIS — N2581 Secondary hyperparathyroidism of renal origin: Secondary | ICD-10-CM | POA: Diagnosis not present

## 2016-07-03 DIAGNOSIS — Z992 Dependence on renal dialysis: Secondary | ICD-10-CM | POA: Diagnosis not present

## 2016-07-03 DIAGNOSIS — D509 Iron deficiency anemia, unspecified: Secondary | ICD-10-CM | POA: Diagnosis not present

## 2016-07-03 DIAGNOSIS — N2581 Secondary hyperparathyroidism of renal origin: Secondary | ICD-10-CM | POA: Diagnosis not present

## 2016-07-03 DIAGNOSIS — N186 End stage renal disease: Secondary | ICD-10-CM | POA: Diagnosis not present

## 2016-07-03 DIAGNOSIS — E162 Hypoglycemia, unspecified: Secondary | ICD-10-CM | POA: Diagnosis not present

## 2016-07-03 DIAGNOSIS — D631 Anemia in chronic kidney disease: Secondary | ICD-10-CM | POA: Diagnosis not present

## 2016-07-05 DIAGNOSIS — E162 Hypoglycemia, unspecified: Secondary | ICD-10-CM | POA: Diagnosis not present

## 2016-07-05 DIAGNOSIS — D631 Anemia in chronic kidney disease: Secondary | ICD-10-CM | POA: Diagnosis not present

## 2016-07-05 DIAGNOSIS — Z992 Dependence on renal dialysis: Secondary | ICD-10-CM | POA: Diagnosis not present

## 2016-07-05 DIAGNOSIS — D509 Iron deficiency anemia, unspecified: Secondary | ICD-10-CM | POA: Diagnosis not present

## 2016-07-05 DIAGNOSIS — N2581 Secondary hyperparathyroidism of renal origin: Secondary | ICD-10-CM | POA: Diagnosis not present

## 2016-07-05 DIAGNOSIS — N186 End stage renal disease: Secondary | ICD-10-CM | POA: Diagnosis not present

## 2016-07-08 DIAGNOSIS — D509 Iron deficiency anemia, unspecified: Secondary | ICD-10-CM | POA: Diagnosis not present

## 2016-07-08 DIAGNOSIS — Z992 Dependence on renal dialysis: Secondary | ICD-10-CM | POA: Diagnosis not present

## 2016-07-08 DIAGNOSIS — N2581 Secondary hyperparathyroidism of renal origin: Secondary | ICD-10-CM | POA: Diagnosis not present

## 2016-07-08 DIAGNOSIS — E162 Hypoglycemia, unspecified: Secondary | ICD-10-CM | POA: Diagnosis not present

## 2016-07-08 DIAGNOSIS — D631 Anemia in chronic kidney disease: Secondary | ICD-10-CM | POA: Diagnosis not present

## 2016-07-08 DIAGNOSIS — N186 End stage renal disease: Secondary | ICD-10-CM | POA: Diagnosis not present

## 2016-07-10 DIAGNOSIS — D509 Iron deficiency anemia, unspecified: Secondary | ICD-10-CM | POA: Diagnosis not present

## 2016-07-10 DIAGNOSIS — N186 End stage renal disease: Secondary | ICD-10-CM | POA: Diagnosis not present

## 2016-07-10 DIAGNOSIS — Z992 Dependence on renal dialysis: Secondary | ICD-10-CM | POA: Diagnosis not present

## 2016-07-10 DIAGNOSIS — D631 Anemia in chronic kidney disease: Secondary | ICD-10-CM | POA: Diagnosis not present

## 2016-07-10 DIAGNOSIS — N2581 Secondary hyperparathyroidism of renal origin: Secondary | ICD-10-CM | POA: Diagnosis not present

## 2016-07-10 DIAGNOSIS — E162 Hypoglycemia, unspecified: Secondary | ICD-10-CM | POA: Diagnosis not present

## 2016-07-12 DIAGNOSIS — Z992 Dependence on renal dialysis: Secondary | ICD-10-CM | POA: Diagnosis not present

## 2016-07-12 DIAGNOSIS — D509 Iron deficiency anemia, unspecified: Secondary | ICD-10-CM | POA: Diagnosis not present

## 2016-07-12 DIAGNOSIS — E162 Hypoglycemia, unspecified: Secondary | ICD-10-CM | POA: Diagnosis not present

## 2016-07-12 DIAGNOSIS — N2581 Secondary hyperparathyroidism of renal origin: Secondary | ICD-10-CM | POA: Diagnosis not present

## 2016-07-12 DIAGNOSIS — N186 End stage renal disease: Secondary | ICD-10-CM | POA: Diagnosis not present

## 2016-07-12 DIAGNOSIS — D631 Anemia in chronic kidney disease: Secondary | ICD-10-CM | POA: Diagnosis not present

## 2016-07-15 DIAGNOSIS — E162 Hypoglycemia, unspecified: Secondary | ICD-10-CM | POA: Diagnosis not present

## 2016-07-15 DIAGNOSIS — N186 End stage renal disease: Secondary | ICD-10-CM | POA: Diagnosis not present

## 2016-07-15 DIAGNOSIS — Z992 Dependence on renal dialysis: Secondary | ICD-10-CM | POA: Diagnosis not present

## 2016-07-15 DIAGNOSIS — D631 Anemia in chronic kidney disease: Secondary | ICD-10-CM | POA: Diagnosis not present

## 2016-07-15 DIAGNOSIS — D509 Iron deficiency anemia, unspecified: Secondary | ICD-10-CM | POA: Diagnosis not present

## 2016-07-15 DIAGNOSIS — N2581 Secondary hyperparathyroidism of renal origin: Secondary | ICD-10-CM | POA: Diagnosis not present

## 2016-07-17 DIAGNOSIS — N2581 Secondary hyperparathyroidism of renal origin: Secondary | ICD-10-CM | POA: Diagnosis not present

## 2016-07-17 DIAGNOSIS — D509 Iron deficiency anemia, unspecified: Secondary | ICD-10-CM | POA: Diagnosis not present

## 2016-07-17 DIAGNOSIS — D631 Anemia in chronic kidney disease: Secondary | ICD-10-CM | POA: Diagnosis not present

## 2016-07-17 DIAGNOSIS — N186 End stage renal disease: Secondary | ICD-10-CM | POA: Diagnosis not present

## 2016-07-17 DIAGNOSIS — Z992 Dependence on renal dialysis: Secondary | ICD-10-CM | POA: Diagnosis not present

## 2016-07-17 DIAGNOSIS — E162 Hypoglycemia, unspecified: Secondary | ICD-10-CM | POA: Diagnosis not present

## 2016-07-19 DIAGNOSIS — N186 End stage renal disease: Secondary | ICD-10-CM | POA: Diagnosis not present

## 2016-07-19 DIAGNOSIS — N2581 Secondary hyperparathyroidism of renal origin: Secondary | ICD-10-CM | POA: Diagnosis not present

## 2016-07-19 DIAGNOSIS — Z992 Dependence on renal dialysis: Secondary | ICD-10-CM | POA: Diagnosis not present

## 2016-07-19 DIAGNOSIS — E162 Hypoglycemia, unspecified: Secondary | ICD-10-CM | POA: Diagnosis not present

## 2016-07-19 DIAGNOSIS — D631 Anemia in chronic kidney disease: Secondary | ICD-10-CM | POA: Diagnosis not present

## 2016-07-19 DIAGNOSIS — D509 Iron deficiency anemia, unspecified: Secondary | ICD-10-CM | POA: Diagnosis not present

## 2016-07-22 DIAGNOSIS — D631 Anemia in chronic kidney disease: Secondary | ICD-10-CM | POA: Diagnosis not present

## 2016-07-22 DIAGNOSIS — E162 Hypoglycemia, unspecified: Secondary | ICD-10-CM | POA: Diagnosis not present

## 2016-07-22 DIAGNOSIS — E119 Type 2 diabetes mellitus without complications: Secondary | ICD-10-CM | POA: Diagnosis not present

## 2016-07-22 DIAGNOSIS — Z992 Dependence on renal dialysis: Secondary | ICD-10-CM | POA: Diagnosis not present

## 2016-07-22 DIAGNOSIS — D509 Iron deficiency anemia, unspecified: Secondary | ICD-10-CM | POA: Diagnosis not present

## 2016-07-22 DIAGNOSIS — Z794 Long term (current) use of insulin: Secondary | ICD-10-CM | POA: Diagnosis not present

## 2016-07-22 DIAGNOSIS — N2581 Secondary hyperparathyroidism of renal origin: Secondary | ICD-10-CM | POA: Diagnosis not present

## 2016-07-22 DIAGNOSIS — N186 End stage renal disease: Secondary | ICD-10-CM | POA: Diagnosis not present

## 2016-07-24 DIAGNOSIS — N186 End stage renal disease: Secondary | ICD-10-CM | POA: Diagnosis not present

## 2016-07-24 DIAGNOSIS — E162 Hypoglycemia, unspecified: Secondary | ICD-10-CM | POA: Diagnosis not present

## 2016-07-24 DIAGNOSIS — N2581 Secondary hyperparathyroidism of renal origin: Secondary | ICD-10-CM | POA: Diagnosis not present

## 2016-07-24 DIAGNOSIS — Z992 Dependence on renal dialysis: Secondary | ICD-10-CM | POA: Diagnosis not present

## 2016-07-24 DIAGNOSIS — D509 Iron deficiency anemia, unspecified: Secondary | ICD-10-CM | POA: Diagnosis not present

## 2016-07-24 DIAGNOSIS — D631 Anemia in chronic kidney disease: Secondary | ICD-10-CM | POA: Diagnosis not present

## 2016-07-26 DIAGNOSIS — D631 Anemia in chronic kidney disease: Secondary | ICD-10-CM | POA: Diagnosis not present

## 2016-07-26 DIAGNOSIS — Z992 Dependence on renal dialysis: Secondary | ICD-10-CM | POA: Diagnosis not present

## 2016-07-26 DIAGNOSIS — D509 Iron deficiency anemia, unspecified: Secondary | ICD-10-CM | POA: Diagnosis not present

## 2016-07-26 DIAGNOSIS — E162 Hypoglycemia, unspecified: Secondary | ICD-10-CM | POA: Diagnosis not present

## 2016-07-26 DIAGNOSIS — N186 End stage renal disease: Secondary | ICD-10-CM | POA: Diagnosis not present

## 2016-07-26 DIAGNOSIS — N2581 Secondary hyperparathyroidism of renal origin: Secondary | ICD-10-CM | POA: Diagnosis not present

## 2016-07-27 DIAGNOSIS — Z992 Dependence on renal dialysis: Secondary | ICD-10-CM | POA: Diagnosis not present

## 2016-07-27 DIAGNOSIS — N186 End stage renal disease: Secondary | ICD-10-CM | POA: Diagnosis not present

## 2016-07-29 DIAGNOSIS — D631 Anemia in chronic kidney disease: Secondary | ICD-10-CM | POA: Diagnosis not present

## 2016-07-29 DIAGNOSIS — N2581 Secondary hyperparathyroidism of renal origin: Secondary | ICD-10-CM | POA: Diagnosis not present

## 2016-07-29 DIAGNOSIS — D509 Iron deficiency anemia, unspecified: Secondary | ICD-10-CM | POA: Diagnosis not present

## 2016-07-29 DIAGNOSIS — Z992 Dependence on renal dialysis: Secondary | ICD-10-CM | POA: Diagnosis not present

## 2016-07-29 DIAGNOSIS — N186 End stage renal disease: Secondary | ICD-10-CM | POA: Diagnosis not present

## 2016-07-30 ENCOUNTER — Ambulatory Visit: Payer: Medicare Other | Admitting: Family Medicine

## 2016-07-31 DIAGNOSIS — N2581 Secondary hyperparathyroidism of renal origin: Secondary | ICD-10-CM | POA: Diagnosis not present

## 2016-07-31 DIAGNOSIS — Z992 Dependence on renal dialysis: Secondary | ICD-10-CM | POA: Diagnosis not present

## 2016-07-31 DIAGNOSIS — D631 Anemia in chronic kidney disease: Secondary | ICD-10-CM | POA: Diagnosis not present

## 2016-07-31 DIAGNOSIS — N186 End stage renal disease: Secondary | ICD-10-CM | POA: Diagnosis not present

## 2016-07-31 DIAGNOSIS — D509 Iron deficiency anemia, unspecified: Secondary | ICD-10-CM | POA: Diagnosis not present

## 2016-08-02 DIAGNOSIS — D509 Iron deficiency anemia, unspecified: Secondary | ICD-10-CM | POA: Diagnosis not present

## 2016-08-02 DIAGNOSIS — N186 End stage renal disease: Secondary | ICD-10-CM | POA: Diagnosis not present

## 2016-08-02 DIAGNOSIS — D631 Anemia in chronic kidney disease: Secondary | ICD-10-CM | POA: Diagnosis not present

## 2016-08-02 DIAGNOSIS — N2581 Secondary hyperparathyroidism of renal origin: Secondary | ICD-10-CM | POA: Diagnosis not present

## 2016-08-02 DIAGNOSIS — Z992 Dependence on renal dialysis: Secondary | ICD-10-CM | POA: Diagnosis not present

## 2016-08-05 DIAGNOSIS — N2581 Secondary hyperparathyroidism of renal origin: Secondary | ICD-10-CM | POA: Diagnosis not present

## 2016-08-05 DIAGNOSIS — Z992 Dependence on renal dialysis: Secondary | ICD-10-CM | POA: Diagnosis not present

## 2016-08-05 DIAGNOSIS — D631 Anemia in chronic kidney disease: Secondary | ICD-10-CM | POA: Diagnosis not present

## 2016-08-05 DIAGNOSIS — N186 End stage renal disease: Secondary | ICD-10-CM | POA: Diagnosis not present

## 2016-08-05 DIAGNOSIS — D509 Iron deficiency anemia, unspecified: Secondary | ICD-10-CM | POA: Diagnosis not present

## 2016-08-06 ENCOUNTER — Ambulatory Visit (INDEPENDENT_AMBULATORY_CARE_PROVIDER_SITE_OTHER): Payer: Medicare Other | Admitting: Family Medicine

## 2016-08-06 ENCOUNTER — Encounter: Payer: Self-pay | Admitting: Family Medicine

## 2016-08-06 VITALS — BP 150/65 | HR 73 | Temp 98.3°F | Ht 64.0 in | Wt 259.0 lb

## 2016-08-06 DIAGNOSIS — Z992 Dependence on renal dialysis: Secondary | ICD-10-CM

## 2016-08-06 DIAGNOSIS — N2889 Other specified disorders of kidney and ureter: Secondary | ICD-10-CM | POA: Diagnosis not present

## 2016-08-06 DIAGNOSIS — E1122 Type 2 diabetes mellitus with diabetic chronic kidney disease: Secondary | ICD-10-CM

## 2016-08-06 DIAGNOSIS — Z794 Long term (current) use of insulin: Secondary | ICD-10-CM

## 2016-08-06 DIAGNOSIS — N186 End stage renal disease: Secondary | ICD-10-CM | POA: Diagnosis not present

## 2016-08-06 DIAGNOSIS — I151 Hypertension secondary to other renal disorders: Secondary | ICD-10-CM | POA: Diagnosis not present

## 2016-08-06 MED ORDER — BLOOD GLUCOSE MONITOR KIT: PACK | 0 refills | Status: DC

## 2016-08-06 MED ORDER — ALBIGLUTIDE 30 MG ~~LOC~~ PEN: 1.0000 | PEN_INJECTOR | SUBCUTANEOUS | 5 refills | Status: DC

## 2016-08-06 NOTE — Patient Instructions (Signed)
Use eucerin, cetaphil, aquaphor or Alpha Keri to moisturize dry skin. Avoid hot baths. Use gentle soap.

## 2016-08-06 NOTE — Progress Notes (Signed)
Subjective:  Patient ID: Christy Zhang, female    DOB: 1956-03-03  Age: 61 y.o. MRN: [id number]  CC: Diabetes (pt here today for routine follow up on diabetes.)   HPI Christy Zhang presents forFollow-up of diabetes. Patient has not been checking blood sugar at home.   Patient denies symptoms such as polyuria, polydipsia, excessive hunger, nausea No significant hypoglycemic spells noted. Medications reviewed. Pt reports taking them regularly without complication/adverse reaction being reported today.  Checking feet daily. Last eye appt was  Due now. Pt will sschedule  History Christy Zhang has a past medical history of Anemia; Arthritis; Asthma; CHF (congestive heart failure) ([place]); Chronic kidney disease; Diabetes mellitus with complication ([place]); End stage renal disease ([place]); End stage renal disease on dialysis [facility]); Hypertension; and Neuromuscular disorder ([place]).   She has a past surgical history that includes Insertion of dialysis catheter; Tubal ligation; AV fistula placement (Right, 08/25/2014); Fistula superficialization (Right, 11/03/2014); Ligation of competing branches of arteriovenous fistula (Right, 11/03/2014); and Fistulogram (Right, 01/16/2016).   Her family history includes Cancer in her father; Diabetes in her daughter, father, mother, and son; Heart disease in her daughter and son; Hypertension in her daughter, mother, sister, and son; Peripheral vascular disease in her son.She reports that she quit smoking about 32 years ago. Her smoking use included Cigarettes. She has never used smokeless tobacco. She reports that she does not drink alcohol or use drugs.  Current Outpatient Prescriptions on File Prior to Visit  Medication Sig Dispense Refill  . acetaminophen (TYLENOL) 500 MG tablet Take 500 mg by mouth every 8 (eight) hours as needed for mild pain.    Marland Kitchen amLODipine (NORVASC) 10 MG tablet Take 10 mg by mouth daily.    Marland Kitchen atorvastatin (LIPITOR) 40 MG tablet Take 1 tablet (40  mg total) by mouth daily. 90 tablet 3  . diphenhydrAMINE (BENADRYL) 25 mg capsule Take 25 mg by mouth every 8 (eight) hours as needed for itching.    . ergocalciferol (VITAMIN D2) 50000 UNITS capsule Take 50,000 Units by mouth once a week.    . Insulin Glargine (TOUJEO SOLOSTAR) 300 UNIT/ML SOPN Inject 100 Units into the skin daily. 7 pen 11  . metoprolol succinate (TOPROL-XL) 100 MG 24 hr tablet Take 100 mg by mouth daily. Take with or immediately following a meal.    . sevelamer carbonate (RENVELA) 800 MG tablet Take 800 mg by mouth 3 (three) times daily with meals. Takes 3 pills with meals, takes 2 pills with snacks     Current Facility-Administered Medications on File Prior to Visit  Medication Dose Route Frequency Provider Last Rate Last Dose  . 0.9 %  sodium chloride infusion   Intravenous Continuous Mal Misty, MD        ROS Review of Systems  Constitutional: Negative for activity change, appetite change and fever.  HENT: Negative for congestion, rhinorrhea and sore throat.   Eyes: Negative for visual disturbance.  Respiratory: Negative for cough and shortness of breath.   Cardiovascular: Negative for chest pain and palpitations.  Gastrointestinal: Negative for abdominal pain, diarrhea and nausea.  Genitourinary: Negative for dysuria.  Musculoskeletal: Negative for arthralgias and myalgias.    Objective:  BP (!) 150/65   Pulse 73   Temp 98.3 F (36.8 C) (Oral)   Ht '5\' 4"'$  (1.626 m)   Wt 259 lb (117.5 kg)   BMI 44.46 kg/m   BP Readings from Last 3 Encounters:  08/06/16 (!) 150/65  06/11/16 130/68  04/16/16 (!) 196/85  Wt Readings from Last 3 Encounters:  08/06/16 259 lb (117.5 kg)  06/11/16 257 lb (116.6 kg)  04/16/16 256 lb (116.1 kg)     Physical Exam  Constitutional: She is oriented to person, place, and time. She appears well-developed and well-nourished. No distress.  HENT:  Head: Normocephalic and atraumatic.  Right Ear: External ear normal.  Left  Ear: External ear normal.  Nose: Nose normal.  Mouth/Throat: Oropharynx is clear and moist.  Eyes: Conjunctivae and EOM are normal. Pupils are equal, round, and reactive to light.  Neck: Normal range of motion. Neck supple. No thyromegaly present.  Cardiovascular: Normal rate, regular rhythm and normal heart sounds.   No murmur heard. Pulmonary/Chest: Effort normal and breath sounds normal. No respiratory distress. She has no wheezes. She has no rales.  Abdominal: Soft. Bowel sounds are normal. She exhibits no distension. There is no tenderness.  Lymphadenopathy:    She has no cervical adenopathy.  Neurological: She is alert and oriented to person, place, and time. She has normal reflexes.  Skin: Skin is warm and dry.  Psychiatric: She has a normal mood and affect. Her behavior is normal. Judgment and thought content normal.    No components found for: BAYER     Assessment & Plan:   Christy Zhang was seen today for diabetes.  Diagnoses and all orders for this visit:  Type 2 diabetes mellitus with chronic kidney disease on chronic dialysis, with long-term current use of insulin (HCC) -     CBC with Differential/Platelet -     CMP14+EGFR -     Lipid panel  Hypertension secondary to other renal disorders  Other orders -     blood glucose meter kit and supplies KIT; Dispense based on patient and insurance preference. Use up to four times daily as directed. (FOR ICD-9 250.00, 250.01). -     Albiglutide 30 MG PEN; Inject 1 Dose into the skin once a week.      I have discontinued Christy Zhang's oseltamivir. I am also having her start on blood glucose meter kit and supplies and Albiglutide. Additionally, I am having her maintain her ergocalciferol, metoprolol succinate, amLODipine, diphenhydrAMINE, acetaminophen, sevelamer carbonate, atorvastatin, and Insulin Glargine.  Meds ordered this encounter  Medications  . blood glucose meter kit and supplies KIT    Sig: Dispense based on  patient and insurance preference. Use up to four times daily as directed. (FOR ICD-9 250.00, 250.01).    Dispense:  1 each    Refill:  0    Order Specific Question:   Number of strips    Answer:   100    Order Specific Question:   Number of lancets    Answer:   100  . Albiglutide 30 MG PEN    Sig: Inject 1 Dose into the skin once a week.    Dispense:  4 each    Refill:  5   We discussed checking blood sugar at home fasting and postprandial. Her blood pressures elevated today however as an end-stage renal patient she will be adjusted tomorrow for blood pressure through her dialysis  Results for orders placed or performed in visit on 04/16/16  Bayer DCA Hb A1c Waived  Result Value Ref Range   Bayer DCA Hb A1c Waived 8.5 (H) <7.0 %  Lipid panel  Result Value Ref Range   Cholesterol, Total 194 100 - 199 mg/dL   Triglycerides 175 (H) 0 - 149 mg/dL   HDL 49 >39 mg/dL  VLDL Cholesterol Cal 35 5 - 40 mg/dL   LDL Calculated 110 (H) 0 - 99 mg/dL   Chol/HDL Ratio 4.0 0.0 - 4.4 ratio units  CMP14+EGFR  Result Value Ref Range   Glucose 195 (H) 65 - 99 mg/dL   BUN 28 (H) 6 - 24 mg/dL   Creatinine, Ser 4.81 (>) 0.57 - 1.00 mg/dL   GFR calc non Af Amer 9 (L) >59 mL/min/1.73   GFR calc Af Amer 11 (L) >59 mL/min/1.73   BUN/Creatinine Ratio 6 (L) 9 - 23   Sodium 140 134 - 144 mmol/L   Potassium 4.7 3.5 - 5.2 mmol/L   Chloride 95 (L) 96 - 106 mmol/L   CO2 26 18 - 29 mmol/L   Calcium 10.0 8.7 - 10.2 mg/dL   Total Protein 8.2 6.0 - 8.5 g/dL   Albumin 4.0 3.5 - 5.5 g/dL   Globulin, Total 4.2 1.5 - 4.5 g/dL   Albumin/Globulin Ratio 1.0 (L) 1.2 - 2.2   Bilirubin Total 0.5 0.0 - 1.2 mg/dL   Alkaline Phosphatase 84 39 - 117 IU/L   AST 9 0 - 40 IU/L   ALT 7 0 - 32 IU/L  Will consider adding GLP-1 if A1c demonstrates lack of control.  Follow-up: Return in about 3 months (around 11/05/2016).  Claretta Fraise, M.D.

## 2016-08-07 DIAGNOSIS — D509 Iron deficiency anemia, unspecified: Secondary | ICD-10-CM | POA: Diagnosis not present

## 2016-08-07 DIAGNOSIS — N2581 Secondary hyperparathyroidism of renal origin: Secondary | ICD-10-CM | POA: Diagnosis not present

## 2016-08-07 DIAGNOSIS — N186 End stage renal disease: Secondary | ICD-10-CM | POA: Diagnosis not present

## 2016-08-07 DIAGNOSIS — D631 Anemia in chronic kidney disease: Secondary | ICD-10-CM | POA: Diagnosis not present

## 2016-08-07 DIAGNOSIS — Z992 Dependence on renal dialysis: Secondary | ICD-10-CM | POA: Diagnosis not present

## 2016-08-07 LAB — CBC WITH DIFFERENTIAL/PLATELET
Basophils Absolute: 0 10*3/uL (ref 0.0–0.2)
Basos: 0 %
EOS (ABSOLUTE): 0.4 10*3/uL (ref 0.0–0.4)
Eos: 4 %
Hematocrit: 34.8 % (ref 34.0–46.6)
Hemoglobin: 11.6 g/dL (ref 11.1–15.9)
Immature Grans (Abs): 0 10*3/uL (ref 0.0–0.1)
Immature Granulocytes: 0 %
Lymphocytes Absolute: 2.1 10*3/uL (ref 0.7–3.1)
Lymphs: 22 %
MCH: 27.6 pg (ref 26.6–33.0)
MCHC: 33.3 g/dL (ref 31.5–35.7)
MCV: 83 fL (ref 79–97)
Monocytes Absolute: 0.5 10*3/uL (ref 0.1–0.9)
Monocytes: 5 %
Neutrophils Absolute: 6.5 10*3/uL (ref 1.4–7.0)
Neutrophils: 69 %
Platelets: 514 10*3/uL — ABNORMAL HIGH (ref 150–379)
RBC: 4.2 x10E6/uL (ref 3.77–5.28)
RDW: 15 % (ref 12.3–15.4)
WBC: 9.6 10*3/uL (ref 3.4–10.8)

## 2016-08-07 LAB — CMP14+EGFR
ALT: 9 IU/L (ref 0–32)
AST: 8 IU/L (ref 0–40)
Albumin/Globulin Ratio: 1.1 — ABNORMAL LOW (ref 1.2–2.2)
Albumin: 4.2 g/dL (ref 3.5–5.5)
Alkaline Phosphatase: 81 IU/L (ref 39–117)
BUN/Creatinine Ratio: 4 — ABNORMAL LOW (ref 9–23)
BUN: 20 mg/dL (ref 6–24)
Bilirubin Total: 0.5 mg/dL (ref 0.0–1.2)
CO2: 24 mmol/L (ref 18–29)
Calcium: 9.6 mg/dL (ref 8.7–10.2)
Chloride: 95 mmol/L — ABNORMAL LOW (ref 96–106)
Creatinine, Ser: 4.95 mg/dL (ref 0.57–1.00)
GFR calc Af Amer: 10 mL/min/{1.73_m2} — ABNORMAL LOW (ref >59)
GFR calc non Af Amer: 9 mL/min/{1.73_m2} — ABNORMAL LOW (ref >59)
Globulin, Total: 3.8 g/dL (ref 1.5–4.5)
Glucose: 180 mg/dL — ABNORMAL HIGH (ref 65–99)
Potassium: 4.9 mmol/L (ref 3.5–5.2)
Sodium: 140 mmol/L (ref 134–144)
Total Protein: 8 g/dL (ref 6.0–8.5)

## 2016-08-07 LAB — LIPID PANEL
Chol/HDL Ratio: 4.3 ratio (ref 0.0–4.4)
Cholesterol, Total: 221 mg/dL — ABNORMAL HIGH (ref 100–199)
HDL: 52 mg/dL (ref >39)
LDL Calculated: 143 mg/dL — ABNORMAL HIGH (ref 0–99)
Triglycerides: 129 mg/dL (ref 0–149)
VLDL Cholesterol Cal: 26 mg/dL (ref 5–40)

## 2016-08-08 ENCOUNTER — Telehealth: Payer: Self-pay

## 2016-08-08 NOTE — Telephone Encounter (Signed)
Pt with poor renal function, not able to take bydureon. Will send to PCP/stacks.

## 2016-08-09 DIAGNOSIS — N186 End stage renal disease: Secondary | ICD-10-CM | POA: Diagnosis not present

## 2016-08-09 DIAGNOSIS — D631 Anemia in chronic kidney disease: Secondary | ICD-10-CM | POA: Diagnosis not present

## 2016-08-09 DIAGNOSIS — D509 Iron deficiency anemia, unspecified: Secondary | ICD-10-CM | POA: Diagnosis not present

## 2016-08-09 DIAGNOSIS — N2581 Secondary hyperparathyroidism of renal origin: Secondary | ICD-10-CM | POA: Diagnosis not present

## 2016-08-09 DIAGNOSIS — Z992 Dependence on renal dialysis: Secondary | ICD-10-CM | POA: Diagnosis not present

## 2016-08-11 NOTE — Telephone Encounter (Signed)
Please seek prior authorization based on end stage renal disease dx. [place] approved, the others are not. Thanks, WS

## 2016-08-12 DIAGNOSIS — D509 Iron deficiency anemia, unspecified: Secondary | ICD-10-CM | POA: Diagnosis not present

## 2016-08-12 DIAGNOSIS — N2581 Secondary hyperparathyroidism of renal origin: Secondary | ICD-10-CM | POA: Diagnosis not present

## 2016-08-12 DIAGNOSIS — N186 End stage renal disease: Secondary | ICD-10-CM | POA: Diagnosis not present

## 2016-08-12 DIAGNOSIS — Z992 Dependence on renal dialysis: Secondary | ICD-10-CM | POA: Diagnosis not present

## 2016-08-12 DIAGNOSIS — D631 Anemia in chronic kidney disease: Secondary | ICD-10-CM | POA: Diagnosis not present

## 2016-08-13 ENCOUNTER — Other Ambulatory Visit: Payer: Self-pay | Admitting: Family Medicine

## 2016-08-14 DIAGNOSIS — N2581 Secondary hyperparathyroidism of renal origin: Secondary | ICD-10-CM | POA: Diagnosis not present

## 2016-08-14 DIAGNOSIS — D631 Anemia in chronic kidney disease: Secondary | ICD-10-CM | POA: Diagnosis not present

## 2016-08-14 DIAGNOSIS — Z992 Dependence on renal dialysis: Secondary | ICD-10-CM | POA: Diagnosis not present

## 2016-08-14 DIAGNOSIS — D509 Iron deficiency anemia, unspecified: Secondary | ICD-10-CM | POA: Diagnosis not present

## 2016-08-14 DIAGNOSIS — N186 End stage renal disease: Secondary | ICD-10-CM | POA: Diagnosis not present

## 2016-08-16 DIAGNOSIS — N2581 Secondary hyperparathyroidism of renal origin: Secondary | ICD-10-CM | POA: Diagnosis not present

## 2016-08-16 DIAGNOSIS — D509 Iron deficiency anemia, unspecified: Secondary | ICD-10-CM | POA: Diagnosis not present

## 2016-08-16 DIAGNOSIS — Z992 Dependence on renal dialysis: Secondary | ICD-10-CM | POA: Diagnosis not present

## 2016-08-16 DIAGNOSIS — D631 Anemia in chronic kidney disease: Secondary | ICD-10-CM | POA: Diagnosis not present

## 2016-08-16 DIAGNOSIS — N186 End stage renal disease: Secondary | ICD-10-CM | POA: Diagnosis not present

## 2016-08-19 DIAGNOSIS — N2581 Secondary hyperparathyroidism of renal origin: Secondary | ICD-10-CM | POA: Diagnosis not present

## 2016-08-19 DIAGNOSIS — Z992 Dependence on renal dialysis: Secondary | ICD-10-CM | POA: Diagnosis not present

## 2016-08-19 DIAGNOSIS — N186 End stage renal disease: Secondary | ICD-10-CM | POA: Diagnosis not present

## 2016-08-19 DIAGNOSIS — D509 Iron deficiency anemia, unspecified: Secondary | ICD-10-CM | POA: Diagnosis not present

## 2016-08-19 DIAGNOSIS — D631 Anemia in chronic kidney disease: Secondary | ICD-10-CM | POA: Diagnosis not present

## 2016-08-21 DIAGNOSIS — D631 Anemia in chronic kidney disease: Secondary | ICD-10-CM | POA: Diagnosis not present

## 2016-08-21 DIAGNOSIS — D509 Iron deficiency anemia, unspecified: Secondary | ICD-10-CM | POA: Diagnosis not present

## 2016-08-21 DIAGNOSIS — N186 End stage renal disease: Secondary | ICD-10-CM | POA: Diagnosis not present

## 2016-08-21 DIAGNOSIS — N2581 Secondary hyperparathyroidism of renal origin: Secondary | ICD-10-CM | POA: Diagnosis not present

## 2016-08-21 DIAGNOSIS — Z992 Dependence on renal dialysis: Secondary | ICD-10-CM | POA: Diagnosis not present

## 2016-08-23 DIAGNOSIS — D509 Iron deficiency anemia, unspecified: Secondary | ICD-10-CM | POA: Diagnosis not present

## 2016-08-23 DIAGNOSIS — N186 End stage renal disease: Secondary | ICD-10-CM | POA: Diagnosis not present

## 2016-08-23 DIAGNOSIS — N2581 Secondary hyperparathyroidism of renal origin: Secondary | ICD-10-CM | POA: Diagnosis not present

## 2016-08-23 DIAGNOSIS — D631 Anemia in chronic kidney disease: Secondary | ICD-10-CM | POA: Diagnosis not present

## 2016-08-23 DIAGNOSIS — Z992 Dependence on renal dialysis: Secondary | ICD-10-CM | POA: Diagnosis not present

## 2016-08-26 DIAGNOSIS — D631 Anemia in chronic kidney disease: Secondary | ICD-10-CM | POA: Diagnosis not present

## 2016-08-26 DIAGNOSIS — N186 End stage renal disease: Secondary | ICD-10-CM | POA: Diagnosis not present

## 2016-08-26 DIAGNOSIS — N2581 Secondary hyperparathyroidism of renal origin: Secondary | ICD-10-CM | POA: Diagnosis not present

## 2016-08-26 DIAGNOSIS — D509 Iron deficiency anemia, unspecified: Secondary | ICD-10-CM | POA: Diagnosis not present

## 2016-08-26 DIAGNOSIS — Z992 Dependence on renal dialysis: Secondary | ICD-10-CM | POA: Diagnosis not present

## 2016-08-27 ENCOUNTER — Telehealth: Payer: Self-pay

## 2016-08-27 NOTE — Telephone Encounter (Signed)
Thanks! Will take care of it now. WS

## 2016-08-28 DIAGNOSIS — D631 Anemia in chronic kidney disease: Secondary | ICD-10-CM | POA: Diagnosis not present

## 2016-08-28 DIAGNOSIS — N186 End stage renal disease: Secondary | ICD-10-CM | POA: Diagnosis not present

## 2016-08-28 DIAGNOSIS — N2581 Secondary hyperparathyroidism of renal origin: Secondary | ICD-10-CM | POA: Diagnosis not present

## 2016-08-28 DIAGNOSIS — D509 Iron deficiency anemia, unspecified: Secondary | ICD-10-CM | POA: Diagnosis not present

## 2016-08-28 DIAGNOSIS — Z992 Dependence on renal dialysis: Secondary | ICD-10-CM | POA: Diagnosis not present

## 2016-08-30 DIAGNOSIS — N186 End stage renal disease: Secondary | ICD-10-CM | POA: Diagnosis not present

## 2016-08-30 DIAGNOSIS — D631 Anemia in chronic kidney disease: Secondary | ICD-10-CM | POA: Diagnosis not present

## 2016-08-30 DIAGNOSIS — N2581 Secondary hyperparathyroidism of renal origin: Secondary | ICD-10-CM | POA: Diagnosis not present

## 2016-08-30 DIAGNOSIS — Z992 Dependence on renal dialysis: Secondary | ICD-10-CM | POA: Diagnosis not present

## 2016-08-30 DIAGNOSIS — D509 Iron deficiency anemia, unspecified: Secondary | ICD-10-CM | POA: Diagnosis not present

## 2016-09-02 DIAGNOSIS — D631 Anemia in chronic kidney disease: Secondary | ICD-10-CM | POA: Diagnosis not present

## 2016-09-02 DIAGNOSIS — N186 End stage renal disease: Secondary | ICD-10-CM | POA: Diagnosis not present

## 2016-09-02 DIAGNOSIS — Z992 Dependence on renal dialysis: Secondary | ICD-10-CM | POA: Diagnosis not present

## 2016-09-02 DIAGNOSIS — N2581 Secondary hyperparathyroidism of renal origin: Secondary | ICD-10-CM | POA: Diagnosis not present

## 2016-09-02 DIAGNOSIS — D509 Iron deficiency anemia, unspecified: Secondary | ICD-10-CM | POA: Diagnosis not present

## 2016-09-03 ENCOUNTER — Ambulatory Visit: Payer: Medicare Other | Admitting: Family Medicine

## 2016-09-04 ENCOUNTER — Encounter: Payer: Self-pay | Admitting: Family Medicine

## 2016-09-04 ENCOUNTER — Telehealth: Payer: Self-pay | Admitting: Family Medicine

## 2016-09-04 DIAGNOSIS — Z992 Dependence on renal dialysis: Secondary | ICD-10-CM | POA: Diagnosis not present

## 2016-09-04 DIAGNOSIS — N2581 Secondary hyperparathyroidism of renal origin: Secondary | ICD-10-CM | POA: Diagnosis not present

## 2016-09-04 DIAGNOSIS — N186 End stage renal disease: Secondary | ICD-10-CM | POA: Diagnosis not present

## 2016-09-04 DIAGNOSIS — D631 Anemia in chronic kidney disease: Secondary | ICD-10-CM | POA: Diagnosis not present

## 2016-09-04 DIAGNOSIS — D509 Iron deficiency anemia, unspecified: Secondary | ICD-10-CM | POA: Diagnosis not present

## 2016-09-06 DIAGNOSIS — D631 Anemia in chronic kidney disease: Secondary | ICD-10-CM | POA: Diagnosis not present

## 2016-09-06 DIAGNOSIS — D509 Iron deficiency anemia, unspecified: Secondary | ICD-10-CM | POA: Diagnosis not present

## 2016-09-06 DIAGNOSIS — N186 End stage renal disease: Secondary | ICD-10-CM | POA: Diagnosis not present

## 2016-09-06 DIAGNOSIS — Z992 Dependence on renal dialysis: Secondary | ICD-10-CM | POA: Diagnosis not present

## 2016-09-06 DIAGNOSIS — N2581 Secondary hyperparathyroidism of renal origin: Secondary | ICD-10-CM | POA: Diagnosis not present

## 2016-09-09 DIAGNOSIS — Z992 Dependence on renal dialysis: Secondary | ICD-10-CM | POA: Diagnosis not present

## 2016-09-09 DIAGNOSIS — D509 Iron deficiency anemia, unspecified: Secondary | ICD-10-CM | POA: Diagnosis not present

## 2016-09-09 DIAGNOSIS — D631 Anemia in chronic kidney disease: Secondary | ICD-10-CM | POA: Diagnosis not present

## 2016-09-09 DIAGNOSIS — N186 End stage renal disease: Secondary | ICD-10-CM | POA: Diagnosis not present

## 2016-09-09 DIAGNOSIS — N2581 Secondary hyperparathyroidism of renal origin: Secondary | ICD-10-CM | POA: Diagnosis not present

## 2016-09-11 DIAGNOSIS — D631 Anemia in chronic kidney disease: Secondary | ICD-10-CM | POA: Diagnosis not present

## 2016-09-11 DIAGNOSIS — N186 End stage renal disease: Secondary | ICD-10-CM | POA: Diagnosis not present

## 2016-09-11 DIAGNOSIS — D509 Iron deficiency anemia, unspecified: Secondary | ICD-10-CM | POA: Diagnosis not present

## 2016-09-11 DIAGNOSIS — N2581 Secondary hyperparathyroidism of renal origin: Secondary | ICD-10-CM | POA: Diagnosis not present

## 2016-09-11 DIAGNOSIS — Z992 Dependence on renal dialysis: Secondary | ICD-10-CM | POA: Diagnosis not present

## 2016-09-13 DIAGNOSIS — D509 Iron deficiency anemia, unspecified: Secondary | ICD-10-CM | POA: Diagnosis not present

## 2016-09-13 DIAGNOSIS — N186 End stage renal disease: Secondary | ICD-10-CM | POA: Diagnosis not present

## 2016-09-13 DIAGNOSIS — Z992 Dependence on renal dialysis: Secondary | ICD-10-CM | POA: Diagnosis not present

## 2016-09-13 DIAGNOSIS — D631 Anemia in chronic kidney disease: Secondary | ICD-10-CM | POA: Diagnosis not present

## 2016-09-13 DIAGNOSIS — N2581 Secondary hyperparathyroidism of renal origin: Secondary | ICD-10-CM | POA: Diagnosis not present

## 2016-09-16 DIAGNOSIS — N2581 Secondary hyperparathyroidism of renal origin: Secondary | ICD-10-CM | POA: Diagnosis not present

## 2016-09-16 DIAGNOSIS — D509 Iron deficiency anemia, unspecified: Secondary | ICD-10-CM | POA: Diagnosis not present

## 2016-09-16 DIAGNOSIS — N186 End stage renal disease: Secondary | ICD-10-CM | POA: Diagnosis not present

## 2016-09-16 DIAGNOSIS — D631 Anemia in chronic kidney disease: Secondary | ICD-10-CM | POA: Diagnosis not present

## 2016-09-16 DIAGNOSIS — Z992 Dependence on renal dialysis: Secondary | ICD-10-CM | POA: Diagnosis not present

## 2016-09-18 DIAGNOSIS — N2581 Secondary hyperparathyroidism of renal origin: Secondary | ICD-10-CM | POA: Diagnosis not present

## 2016-09-18 DIAGNOSIS — Z992 Dependence on renal dialysis: Secondary | ICD-10-CM | POA: Diagnosis not present

## 2016-09-18 DIAGNOSIS — D509 Iron deficiency anemia, unspecified: Secondary | ICD-10-CM | POA: Diagnosis not present

## 2016-09-18 DIAGNOSIS — D631 Anemia in chronic kidney disease: Secondary | ICD-10-CM | POA: Diagnosis not present

## 2016-09-18 DIAGNOSIS — N186 End stage renal disease: Secondary | ICD-10-CM | POA: Diagnosis not present

## 2016-09-20 DIAGNOSIS — N2581 Secondary hyperparathyroidism of renal origin: Secondary | ICD-10-CM | POA: Diagnosis not present

## 2016-09-20 DIAGNOSIS — N186 End stage renal disease: Secondary | ICD-10-CM | POA: Diagnosis not present

## 2016-09-20 DIAGNOSIS — D631 Anemia in chronic kidney disease: Secondary | ICD-10-CM | POA: Diagnosis not present

## 2016-09-20 DIAGNOSIS — Z992 Dependence on renal dialysis: Secondary | ICD-10-CM | POA: Diagnosis not present

## 2016-09-20 DIAGNOSIS — D509 Iron deficiency anemia, unspecified: Secondary | ICD-10-CM | POA: Diagnosis not present

## 2016-09-23 DIAGNOSIS — N2581 Secondary hyperparathyroidism of renal origin: Secondary | ICD-10-CM | POA: Diagnosis not present

## 2016-09-23 DIAGNOSIS — D509 Iron deficiency anemia, unspecified: Secondary | ICD-10-CM | POA: Diagnosis not present

## 2016-09-23 DIAGNOSIS — D631 Anemia in chronic kidney disease: Secondary | ICD-10-CM | POA: Diagnosis not present

## 2016-09-23 DIAGNOSIS — N186 End stage renal disease: Secondary | ICD-10-CM | POA: Diagnosis not present

## 2016-09-23 DIAGNOSIS — Z992 Dependence on renal dialysis: Secondary | ICD-10-CM | POA: Diagnosis not present

## 2016-09-25 DIAGNOSIS — Z992 Dependence on renal dialysis: Secondary | ICD-10-CM | POA: Diagnosis not present

## 2016-09-25 DIAGNOSIS — D509 Iron deficiency anemia, unspecified: Secondary | ICD-10-CM | POA: Diagnosis not present

## 2016-09-25 DIAGNOSIS — N2581 Secondary hyperparathyroidism of renal origin: Secondary | ICD-10-CM | POA: Diagnosis not present

## 2016-09-25 DIAGNOSIS — N186 End stage renal disease: Secondary | ICD-10-CM | POA: Diagnosis not present

## 2016-09-25 DIAGNOSIS — D631 Anemia in chronic kidney disease: Secondary | ICD-10-CM | POA: Diagnosis not present

## 2016-09-26 DIAGNOSIS — N186 End stage renal disease: Secondary | ICD-10-CM | POA: Diagnosis not present

## 2016-09-26 DIAGNOSIS — Z992 Dependence on renal dialysis: Secondary | ICD-10-CM | POA: Diagnosis not present

## 2016-09-27 DIAGNOSIS — D509 Iron deficiency anemia, unspecified: Secondary | ICD-10-CM | POA: Diagnosis not present

## 2016-09-27 DIAGNOSIS — N186 End stage renal disease: Secondary | ICD-10-CM | POA: Diagnosis not present

## 2016-09-27 DIAGNOSIS — Z992 Dependence on renal dialysis: Secondary | ICD-10-CM | POA: Diagnosis not present

## 2016-09-27 DIAGNOSIS — D631 Anemia in chronic kidney disease: Secondary | ICD-10-CM | POA: Diagnosis not present

## 2016-09-27 DIAGNOSIS — N2581 Secondary hyperparathyroidism of renal origin: Secondary | ICD-10-CM | POA: Diagnosis not present

## 2016-09-30 DIAGNOSIS — Z992 Dependence on renal dialysis: Secondary | ICD-10-CM | POA: Diagnosis not present

## 2016-09-30 DIAGNOSIS — D509 Iron deficiency anemia, unspecified: Secondary | ICD-10-CM | POA: Diagnosis not present

## 2016-09-30 DIAGNOSIS — N2581 Secondary hyperparathyroidism of renal origin: Secondary | ICD-10-CM | POA: Diagnosis not present

## 2016-09-30 DIAGNOSIS — N186 End stage renal disease: Secondary | ICD-10-CM | POA: Diagnosis not present

## 2016-09-30 DIAGNOSIS — D631 Anemia in chronic kidney disease: Secondary | ICD-10-CM | POA: Diagnosis not present

## 2016-10-02 DIAGNOSIS — N186 End stage renal disease: Secondary | ICD-10-CM | POA: Diagnosis not present

## 2016-10-02 DIAGNOSIS — D509 Iron deficiency anemia, unspecified: Secondary | ICD-10-CM | POA: Diagnosis not present

## 2016-10-02 DIAGNOSIS — D631 Anemia in chronic kidney disease: Secondary | ICD-10-CM | POA: Diagnosis not present

## 2016-10-02 DIAGNOSIS — N2581 Secondary hyperparathyroidism of renal origin: Secondary | ICD-10-CM | POA: Diagnosis not present

## 2016-10-02 DIAGNOSIS — Z992 Dependence on renal dialysis: Secondary | ICD-10-CM | POA: Diagnosis not present

## 2016-10-04 DIAGNOSIS — D509 Iron deficiency anemia, unspecified: Secondary | ICD-10-CM | POA: Diagnosis not present

## 2016-10-04 DIAGNOSIS — N2581 Secondary hyperparathyroidism of renal origin: Secondary | ICD-10-CM | POA: Diagnosis not present

## 2016-10-04 DIAGNOSIS — N186 End stage renal disease: Secondary | ICD-10-CM | POA: Diagnosis not present

## 2016-10-04 DIAGNOSIS — D631 Anemia in chronic kidney disease: Secondary | ICD-10-CM | POA: Diagnosis not present

## 2016-10-04 DIAGNOSIS — Z992 Dependence on renal dialysis: Secondary | ICD-10-CM | POA: Diagnosis not present

## 2016-10-07 DIAGNOSIS — N186 End stage renal disease: Secondary | ICD-10-CM | POA: Diagnosis not present

## 2016-10-07 DIAGNOSIS — D631 Anemia in chronic kidney disease: Secondary | ICD-10-CM | POA: Diagnosis not present

## 2016-10-07 DIAGNOSIS — N2581 Secondary hyperparathyroidism of renal origin: Secondary | ICD-10-CM | POA: Diagnosis not present

## 2016-10-07 DIAGNOSIS — D509 Iron deficiency anemia, unspecified: Secondary | ICD-10-CM | POA: Diagnosis not present

## 2016-10-07 DIAGNOSIS — Z992 Dependence on renal dialysis: Secondary | ICD-10-CM | POA: Diagnosis not present

## 2016-10-09 DIAGNOSIS — Z992 Dependence on renal dialysis: Secondary | ICD-10-CM | POA: Diagnosis not present

## 2016-10-09 DIAGNOSIS — D509 Iron deficiency anemia, unspecified: Secondary | ICD-10-CM | POA: Diagnosis not present

## 2016-10-09 DIAGNOSIS — N186 End stage renal disease: Secondary | ICD-10-CM | POA: Diagnosis not present

## 2016-10-09 DIAGNOSIS — N2581 Secondary hyperparathyroidism of renal origin: Secondary | ICD-10-CM | POA: Diagnosis not present

## 2016-10-09 DIAGNOSIS — D631 Anemia in chronic kidney disease: Secondary | ICD-10-CM | POA: Diagnosis not present

## 2016-10-11 DIAGNOSIS — D509 Iron deficiency anemia, unspecified: Secondary | ICD-10-CM | POA: Diagnosis not present

## 2016-10-11 DIAGNOSIS — Z992 Dependence on renal dialysis: Secondary | ICD-10-CM | POA: Diagnosis not present

## 2016-10-11 DIAGNOSIS — D631 Anemia in chronic kidney disease: Secondary | ICD-10-CM | POA: Diagnosis not present

## 2016-10-11 DIAGNOSIS — N186 End stage renal disease: Secondary | ICD-10-CM | POA: Diagnosis not present

## 2016-10-11 DIAGNOSIS — N2581 Secondary hyperparathyroidism of renal origin: Secondary | ICD-10-CM | POA: Diagnosis not present

## 2016-10-14 DIAGNOSIS — N186 End stage renal disease: Secondary | ICD-10-CM | POA: Diagnosis not present

## 2016-10-14 DIAGNOSIS — D631 Anemia in chronic kidney disease: Secondary | ICD-10-CM | POA: Diagnosis not present

## 2016-10-14 DIAGNOSIS — D509 Iron deficiency anemia, unspecified: Secondary | ICD-10-CM | POA: Diagnosis not present

## 2016-10-14 DIAGNOSIS — Z992 Dependence on renal dialysis: Secondary | ICD-10-CM | POA: Diagnosis not present

## 2016-10-14 DIAGNOSIS — N2581 Secondary hyperparathyroidism of renal origin: Secondary | ICD-10-CM | POA: Diagnosis not present

## 2016-10-16 DIAGNOSIS — D509 Iron deficiency anemia, unspecified: Secondary | ICD-10-CM | POA: Diagnosis not present

## 2016-10-16 DIAGNOSIS — Z992 Dependence on renal dialysis: Secondary | ICD-10-CM | POA: Diagnosis not present

## 2016-10-16 DIAGNOSIS — N186 End stage renal disease: Secondary | ICD-10-CM | POA: Diagnosis not present

## 2016-10-16 DIAGNOSIS — N2581 Secondary hyperparathyroidism of renal origin: Secondary | ICD-10-CM | POA: Diagnosis not present

## 2016-10-16 DIAGNOSIS — D631 Anemia in chronic kidney disease: Secondary | ICD-10-CM | POA: Diagnosis not present

## 2016-10-18 DIAGNOSIS — Z992 Dependence on renal dialysis: Secondary | ICD-10-CM | POA: Diagnosis not present

## 2016-10-18 DIAGNOSIS — D509 Iron deficiency anemia, unspecified: Secondary | ICD-10-CM | POA: Diagnosis not present

## 2016-10-18 DIAGNOSIS — N2581 Secondary hyperparathyroidism of renal origin: Secondary | ICD-10-CM | POA: Diagnosis not present

## 2016-10-18 DIAGNOSIS — N186 End stage renal disease: Secondary | ICD-10-CM | POA: Diagnosis not present

## 2016-10-18 DIAGNOSIS — D631 Anemia in chronic kidney disease: Secondary | ICD-10-CM | POA: Diagnosis not present

## 2016-10-21 DIAGNOSIS — N186 End stage renal disease: Secondary | ICD-10-CM | POA: Diagnosis not present

## 2016-10-21 DIAGNOSIS — D509 Iron deficiency anemia, unspecified: Secondary | ICD-10-CM | POA: Diagnosis not present

## 2016-10-21 DIAGNOSIS — N2581 Secondary hyperparathyroidism of renal origin: Secondary | ICD-10-CM | POA: Diagnosis not present

## 2016-10-21 DIAGNOSIS — E119 Type 2 diabetes mellitus without complications: Secondary | ICD-10-CM | POA: Diagnosis not present

## 2016-10-21 DIAGNOSIS — Z992 Dependence on renal dialysis: Secondary | ICD-10-CM | POA: Diagnosis not present

## 2016-10-21 DIAGNOSIS — Z794 Long term (current) use of insulin: Secondary | ICD-10-CM | POA: Diagnosis not present

## 2016-10-21 DIAGNOSIS — D631 Anemia in chronic kidney disease: Secondary | ICD-10-CM | POA: Diagnosis not present

## 2016-10-23 DIAGNOSIS — N2581 Secondary hyperparathyroidism of renal origin: Secondary | ICD-10-CM | POA: Diagnosis not present

## 2016-10-23 DIAGNOSIS — D509 Iron deficiency anemia, unspecified: Secondary | ICD-10-CM | POA: Diagnosis not present

## 2016-10-23 DIAGNOSIS — N186 End stage renal disease: Secondary | ICD-10-CM | POA: Diagnosis not present

## 2016-10-23 DIAGNOSIS — D631 Anemia in chronic kidney disease: Secondary | ICD-10-CM | POA: Diagnosis not present

## 2016-10-23 DIAGNOSIS — Z992 Dependence on renal dialysis: Secondary | ICD-10-CM | POA: Diagnosis not present

## 2016-10-25 DIAGNOSIS — N186 End stage renal disease: Secondary | ICD-10-CM | POA: Diagnosis not present

## 2016-10-25 DIAGNOSIS — D631 Anemia in chronic kidney disease: Secondary | ICD-10-CM | POA: Diagnosis not present

## 2016-10-25 DIAGNOSIS — D509 Iron deficiency anemia, unspecified: Secondary | ICD-10-CM | POA: Diagnosis not present

## 2016-10-25 DIAGNOSIS — Z992 Dependence on renal dialysis: Secondary | ICD-10-CM | POA: Diagnosis not present

## 2016-10-25 DIAGNOSIS — N2581 Secondary hyperparathyroidism of renal origin: Secondary | ICD-10-CM | POA: Diagnosis not present

## 2016-10-26 DIAGNOSIS — N186 End stage renal disease: Secondary | ICD-10-CM | POA: Diagnosis not present

## 2016-10-26 DIAGNOSIS — Z992 Dependence on renal dialysis: Secondary | ICD-10-CM | POA: Diagnosis not present

## 2016-10-28 DIAGNOSIS — N2581 Secondary hyperparathyroidism of renal origin: Secondary | ICD-10-CM | POA: Diagnosis not present

## 2016-10-28 DIAGNOSIS — Z992 Dependence on renal dialysis: Secondary | ICD-10-CM | POA: Diagnosis not present

## 2016-10-28 DIAGNOSIS — N186 End stage renal disease: Secondary | ICD-10-CM | POA: Diagnosis not present

## 2016-10-28 DIAGNOSIS — D631 Anemia in chronic kidney disease: Secondary | ICD-10-CM | POA: Diagnosis not present

## 2016-10-28 DIAGNOSIS — D509 Iron deficiency anemia, unspecified: Secondary | ICD-10-CM | POA: Diagnosis not present

## 2016-10-29 ENCOUNTER — Encounter: Payer: Self-pay | Admitting: Family Medicine

## 2016-10-29 ENCOUNTER — Ambulatory Visit (INDEPENDENT_AMBULATORY_CARE_PROVIDER_SITE_OTHER): Payer: Medicare Other | Admitting: Family Medicine

## 2016-10-29 VITALS — BP 139/72 | HR 70 | Temp 97.4°F | Ht 64.0 in | Wt 259.0 lb

## 2016-10-29 DIAGNOSIS — I151 Hypertension secondary to other renal disorders: Secondary | ICD-10-CM

## 2016-10-29 DIAGNOSIS — E1122 Type 2 diabetes mellitus with diabetic chronic kidney disease: Secondary | ICD-10-CM | POA: Diagnosis not present

## 2016-10-29 DIAGNOSIS — Z794 Long term (current) use of insulin: Secondary | ICD-10-CM

## 2016-10-29 DIAGNOSIS — Z992 Dependence on renal dialysis: Secondary | ICD-10-CM | POA: Diagnosis not present

## 2016-10-29 DIAGNOSIS — N186 End stage renal disease: Secondary | ICD-10-CM

## 2016-10-29 DIAGNOSIS — N2889 Other specified disorders of kidney and ureter: Secondary | ICD-10-CM

## 2016-10-29 DIAGNOSIS — J4530 Mild persistent asthma, uncomplicated: Secondary | ICD-10-CM

## 2016-10-29 MED ORDER — BLOOD GLUCOSE MONITOR SYSTEM W/DEVICE KIT
PACK | 0 refills | Status: DC
Start: 2016-10-29 — End: 2016-11-01

## 2016-10-29 NOTE — Progress Notes (Signed)
Subjective:  Patient ID: Christy Zhang,  female    DOB: 04/21/1956  Age: 61 y.o.    CC: Diabetes (pt here today for routine follow up of her chronic medical conditions)   HPI Christy Zhang presents for  follow-up of hypertension. Patient has no history of headache chest pain or shortness of breath or recent cough. Patient also denies symptoms of TIA such as numbness weakness lateralizing. Patient denies side effects from medication. States taking it regularly.  Patient also  in for follow-up of elevated cholesterol. Doing well without complaints on current medication. Denies side effects of statin including myalgia and arthralgia and nausea. Also in today for liver function testing. Currently no chest pain, shortness of breath or other cardiovascular related symptoms noted.  Follow-up of diabetes. Patient does check blood sugar at home. Readings run around 200. A1c done through dialysis clinic 2 weeks ago was 8.2 Patient denies symptoms such as polyuria, polydipsia, excessive hunger, nausea No significant hypoglycemic spells noted. Medications reviewed. Pt reports taking them regularly. Pt. denies complication/adverse reaction today.    History Christy Zhang has a past medical history of Anemia; Arthritis; Asthma; CHF (congestive heart failure) ([place]); Chronic kidney disease; Diabetes mellitus with complication ([place]); End stage renal disease ([place]); End stage renal disease on dialysis [facility]); Hypertension; and Neuromuscular disorder ([place]).   She has a past surgical history that includes Insertion of dialysis catheter; Tubal ligation; AV fistula placement (Right, 08/25/2014); Fistula superficialization (Right, 11/03/2014); Ligation of competing branches of arteriovenous fistula (Right, 11/03/2014); and Fistulogram (Right, 01/16/2016).   Her family history includes Cancer in her father; Diabetes in her daughter, father, mother, and son; Heart disease in her daughter and son; Hypertension in her  daughter, mother, sister, and son; Peripheral vascular disease in her son.She reports that she quit smoking about 32 years ago. Her smoking use included Cigarettes. She has never used smokeless tobacco. She reports that she does not drink alcohol or use drugs.  Current Outpatient Prescriptions on File Prior to Visit  Medication Sig Dispense Refill  . acetaminophen (TYLENOL) 500 MG tablet Take 500 mg by mouth every 8 (eight) hours as needed for mild pain.    . Albiglutide 30 MG PEN Inject 1 Dose into the skin once a week. 4 each 5  . amLODipine (NORVASC) 10 MG tablet Take 10 mg by mouth daily.    Marland Kitchen atorvastatin (LIPITOR) 40 MG tablet Take 1 tablet (40 mg total) by mouth daily. 90 tablet 3  . diphenhydrAMINE (BENADRYL) 25 mg capsule Take 25 mg by mouth every 8 (eight) hours as needed for itching.    . ergocalciferol (VITAMIN D2) 50000 UNITS capsule Take 50,000 Units by mouth once a week.    . metoprolol succinate (TOPROL-XL) 100 MG 24 hr tablet Take 100 mg by mouth daily. Take with or immediately following a meal.    . sevelamer carbonate (RENVELA) 800 MG tablet Take 800 mg by mouth 3 (three) times daily with meals. Takes 3 pills with meals, takes 2 pills with snacks    . TOUJEO SOLOSTAR 300 UNIT/ML SOPN INJECT 80 UNITS SUBCUTANEOUSLY ONCE DAILY 6 mL 11   Current Facility-Administered Medications on File Prior to Visit  Medication Dose Route Frequency Provider Last Rate Last Dose  . 0.9 %  sodium chloride infusion   Intravenous Continuous Mal Misty, MD        ROS Review of Systems  Constitutional: Negative for activity change, appetite change and fever.  HENT: Negative for congestion, rhinorrhea and  sore throat.   Eyes: Negative for visual disturbance.  Respiratory: Negative for cough and shortness of breath.   Cardiovascular: Negative for chest pain and palpitations.  Gastrointestinal: Negative for abdominal pain, diarrhea and nausea.  Genitourinary: Negative for dysuria.    Musculoskeletal: Negative for arthralgias and myalgias.    Objective:  BP 139/72   Pulse 70   Temp 97.4 F (36.3 C) (Oral)   Ht 5' 4" (1.626 m)   Wt 259 lb (117.5 kg)   BMI 44.46 kg/m   BP Readings from Last 3 Encounters:  10/29/16 139/72  08/06/16 (!) 150/65  06/11/16 130/68    Wt Readings from Last 3 Encounters:  10/29/16 259 lb (117.5 kg)  08/06/16 259 lb (117.5 kg)  06/11/16 257 lb (116.6 kg)     Physical Exam  Constitutional: She is oriented to person, place, and time. She appears well-developed and well-nourished. No distress.  HENT:  Head: Normocephalic and atraumatic.  Right Ear: External ear normal.  Left Ear: External ear normal.  Nose: Nose normal.  Mouth/Throat: Oropharynx is clear and moist.  Eyes: Pupils are equal, round, and reactive to light. Conjunctivae and EOM are normal.  Neck: Normal range of motion. Neck supple. No thyromegaly present.  Cardiovascular: Normal rate, regular rhythm and normal heart sounds.   No murmur heard. Pulmonary/Chest: Effort normal and breath sounds normal. No respiratory distress. She has no wheezes. She has no rales.  Abdominal: Soft. Bowel sounds are normal. She exhibits no distension. There is no tenderness.  Lymphadenopathy:    She has no cervical adenopathy.  Neurological: She is alert and oriented to person, place, and time. She has normal reflexes.  Skin: Skin is warm and dry.  Psychiatric: She has a normal mood and affect. Her behavior is normal. Judgment and thought content normal.    Diabetic Foot Exam - Simple   Simple Foot Form Diabetic Foot exam was performed with the following findings:  Yes 10/29/2016  8:30 AM  Visual Inspection No deformities, no ulcerations, no other skin breakdown bilaterally:  Yes Sensation Testing Intact to touch and monofilament testing bilaterally:  Yes Pulse Check Posterior Tibialis and Dorsalis pulse intact bilaterally:  Yes Comments       Assessment & Plan:    Christy Zhang was seen today for diabetes.  Diagnoses and all orders for this visit:  Type 2 diabetes mellitus with chronic kidney disease on chronic dialysis, with long-term current use of insulin (HCC)  End stage renal disease ([place])  Hypertension secondary to other renal disorders  End stage renal disease on dialysis ([place])  Mild persistent asthma without complication  Other orders -     Discontinue: Blood Glucose Monitoring Suppl (BLOOD GLUCOSE MONITOR SYSTEM) w/Device KIT; Use to check Blood Sugar up to 4 x daily as directed. Dispense based on pt insurance.   I have discontinued Christy Zhang's Insulin Glargine and blood glucose meter kit and supplies. I am also having her maintain her ergocalciferol, metoprolol succinate, amLODipine, diphenhydrAMINE, acetaminophen, sevelamer carbonate, atorvastatin, Albiglutide, and TOUJEO SOLOSTAR.  Meds ordered this encounter  Medications  . DISCONTD: Blood Glucose Monitoring Suppl (BLOOD GLUCOSE MONITOR SYSTEM) w/Device KIT    Sig: Use to check Blood Sugar up to 4 x daily as directed. Dispense based on pt insurance.    Dispense:  1 each    Refill:  0     Follow-up: Return in about 3 months (around 01/29/2017).  Claretta Fraise, M.D.

## 2016-10-30 DIAGNOSIS — D631 Anemia in chronic kidney disease: Secondary | ICD-10-CM | POA: Diagnosis not present

## 2016-10-30 DIAGNOSIS — D509 Iron deficiency anemia, unspecified: Secondary | ICD-10-CM | POA: Diagnosis not present

## 2016-10-30 DIAGNOSIS — Z992 Dependence on renal dialysis: Secondary | ICD-10-CM | POA: Diagnosis not present

## 2016-10-30 DIAGNOSIS — N186 End stage renal disease: Secondary | ICD-10-CM | POA: Diagnosis not present

## 2016-10-30 DIAGNOSIS — N2581 Secondary hyperparathyroidism of renal origin: Secondary | ICD-10-CM | POA: Diagnosis not present

## 2016-11-01 ENCOUNTER — Other Ambulatory Visit: Payer: Self-pay | Admitting: *Deleted

## 2016-11-01 DIAGNOSIS — D509 Iron deficiency anemia, unspecified: Secondary | ICD-10-CM | POA: Diagnosis not present

## 2016-11-01 DIAGNOSIS — N186 End stage renal disease: Secondary | ICD-10-CM | POA: Diagnosis not present

## 2016-11-01 DIAGNOSIS — Z992 Dependence on renal dialysis: Secondary | ICD-10-CM | POA: Diagnosis not present

## 2016-11-01 DIAGNOSIS — N2581 Secondary hyperparathyroidism of renal origin: Secondary | ICD-10-CM | POA: Diagnosis not present

## 2016-11-01 DIAGNOSIS — D631 Anemia in chronic kidney disease: Secondary | ICD-10-CM | POA: Diagnosis not present

## 2016-11-01 MED ORDER — BLOOD GLUCOSE MONITOR SYSTEM W/DEVICE KIT: PACK | 0 refills | Status: DC

## 2016-11-04 DIAGNOSIS — D631 Anemia in chronic kidney disease: Secondary | ICD-10-CM | POA: Diagnosis not present

## 2016-11-04 DIAGNOSIS — D509 Iron deficiency anemia, unspecified: Secondary | ICD-10-CM | POA: Diagnosis not present

## 2016-11-04 DIAGNOSIS — N186 End stage renal disease: Secondary | ICD-10-CM | POA: Diagnosis not present

## 2016-11-04 DIAGNOSIS — Z992 Dependence on renal dialysis: Secondary | ICD-10-CM | POA: Diagnosis not present

## 2016-11-04 DIAGNOSIS — N2581 Secondary hyperparathyroidism of renal origin: Secondary | ICD-10-CM | POA: Diagnosis not present

## 2016-11-06 DIAGNOSIS — N2581 Secondary hyperparathyroidism of renal origin: Secondary | ICD-10-CM | POA: Diagnosis not present

## 2016-11-06 DIAGNOSIS — D631 Anemia in chronic kidney disease: Secondary | ICD-10-CM | POA: Diagnosis not present

## 2016-11-06 DIAGNOSIS — N186 End stage renal disease: Secondary | ICD-10-CM | POA: Diagnosis not present

## 2016-11-06 DIAGNOSIS — Z992 Dependence on renal dialysis: Secondary | ICD-10-CM | POA: Diagnosis not present

## 2016-11-06 DIAGNOSIS — D509 Iron deficiency anemia, unspecified: Secondary | ICD-10-CM | POA: Diagnosis not present

## 2016-11-08 DIAGNOSIS — Z992 Dependence on renal dialysis: Secondary | ICD-10-CM | POA: Diagnosis not present

## 2016-11-08 DIAGNOSIS — D631 Anemia in chronic kidney disease: Secondary | ICD-10-CM | POA: Diagnosis not present

## 2016-11-08 DIAGNOSIS — N186 End stage renal disease: Secondary | ICD-10-CM | POA: Diagnosis not present

## 2016-11-08 DIAGNOSIS — D509 Iron deficiency anemia, unspecified: Secondary | ICD-10-CM | POA: Diagnosis not present

## 2016-11-08 DIAGNOSIS — N2581 Secondary hyperparathyroidism of renal origin: Secondary | ICD-10-CM | POA: Diagnosis not present

## 2016-11-11 ENCOUNTER — Other Ambulatory Visit: Payer: Self-pay | Admitting: *Deleted

## 2016-11-11 ENCOUNTER — Telehealth: Payer: Self-pay | Admitting: Family Medicine

## 2016-11-11 DIAGNOSIS — N2581 Secondary hyperparathyroidism of renal origin: Secondary | ICD-10-CM | POA: Diagnosis not present

## 2016-11-11 DIAGNOSIS — D631 Anemia in chronic kidney disease: Secondary | ICD-10-CM | POA: Diagnosis not present

## 2016-11-11 DIAGNOSIS — N186 End stage renal disease: Secondary | ICD-10-CM | POA: Diagnosis not present

## 2016-11-11 DIAGNOSIS — Z992 Dependence on renal dialysis: Secondary | ICD-10-CM | POA: Diagnosis not present

## 2016-11-11 DIAGNOSIS — Z794 Long term (current) use of insulin: Principal | ICD-10-CM

## 2016-11-11 DIAGNOSIS — E0822 Diabetes mellitus due to underlying condition with diabetic chronic kidney disease: Secondary | ICD-10-CM

## 2016-11-11 DIAGNOSIS — D509 Iron deficiency anemia, unspecified: Secondary | ICD-10-CM | POA: Diagnosis not present

## 2016-11-11 MED ORDER — GLUCOSE BLOOD VI STRP: ORAL_STRIP | 12 refills | Status: DC

## 2016-11-11 MED ORDER — LANCETS MISC. KIT: 1.0000 | PACK | Freq: Four times a day (QID) | 11 refills | Status: DC | PRN

## 2016-11-11 NOTE — Progress Notes (Signed)
Pharmacy requesting RX for diabetic test supplies Pt had check up with PCP on 10/29/2016 RX sent into [facility] in [place]

## 2016-11-13 DIAGNOSIS — D631 Anemia in chronic kidney disease: Secondary | ICD-10-CM | POA: Diagnosis not present

## 2016-11-13 DIAGNOSIS — N186 End stage renal disease: Secondary | ICD-10-CM | POA: Diagnosis not present

## 2016-11-13 DIAGNOSIS — D509 Iron deficiency anemia, unspecified: Secondary | ICD-10-CM | POA: Diagnosis not present

## 2016-11-13 DIAGNOSIS — Z992 Dependence on renal dialysis: Secondary | ICD-10-CM | POA: Diagnosis not present

## 2016-11-13 DIAGNOSIS — N2581 Secondary hyperparathyroidism of renal origin: Secondary | ICD-10-CM | POA: Diagnosis not present

## 2016-11-15 DIAGNOSIS — D631 Anemia in chronic kidney disease: Secondary | ICD-10-CM | POA: Diagnosis not present

## 2016-11-15 DIAGNOSIS — N2581 Secondary hyperparathyroidism of renal origin: Secondary | ICD-10-CM | POA: Diagnosis not present

## 2016-11-15 DIAGNOSIS — D509 Iron deficiency anemia, unspecified: Secondary | ICD-10-CM | POA: Diagnosis not present

## 2016-11-15 DIAGNOSIS — Z992 Dependence on renal dialysis: Secondary | ICD-10-CM | POA: Diagnosis not present

## 2016-11-15 DIAGNOSIS — N186 End stage renal disease: Secondary | ICD-10-CM | POA: Diagnosis not present

## 2016-11-18 DIAGNOSIS — N186 End stage renal disease: Secondary | ICD-10-CM | POA: Diagnosis not present

## 2016-11-18 DIAGNOSIS — D631 Anemia in chronic kidney disease: Secondary | ICD-10-CM | POA: Diagnosis not present

## 2016-11-18 DIAGNOSIS — Z992 Dependence on renal dialysis: Secondary | ICD-10-CM | POA: Diagnosis not present

## 2016-11-18 DIAGNOSIS — N2581 Secondary hyperparathyroidism of renal origin: Secondary | ICD-10-CM | POA: Diagnosis not present

## 2016-11-18 DIAGNOSIS — D509 Iron deficiency anemia, unspecified: Secondary | ICD-10-CM | POA: Diagnosis not present

## 2016-11-20 DIAGNOSIS — N186 End stage renal disease: Secondary | ICD-10-CM | POA: Diagnosis not present

## 2016-11-20 DIAGNOSIS — Z992 Dependence on renal dialysis: Secondary | ICD-10-CM | POA: Diagnosis not present

## 2016-11-20 DIAGNOSIS — N2581 Secondary hyperparathyroidism of renal origin: Secondary | ICD-10-CM | POA: Diagnosis not present

## 2016-11-20 DIAGNOSIS — D631 Anemia in chronic kidney disease: Secondary | ICD-10-CM | POA: Diagnosis not present

## 2016-11-20 DIAGNOSIS — D509 Iron deficiency anemia, unspecified: Secondary | ICD-10-CM | POA: Diagnosis not present

## 2016-11-22 DIAGNOSIS — D509 Iron deficiency anemia, unspecified: Secondary | ICD-10-CM | POA: Diagnosis not present

## 2016-11-22 DIAGNOSIS — N186 End stage renal disease: Secondary | ICD-10-CM | POA: Diagnosis not present

## 2016-11-22 DIAGNOSIS — D631 Anemia in chronic kidney disease: Secondary | ICD-10-CM | POA: Diagnosis not present

## 2016-11-22 DIAGNOSIS — N2581 Secondary hyperparathyroidism of renal origin: Secondary | ICD-10-CM | POA: Diagnosis not present

## 2016-11-22 DIAGNOSIS — Z992 Dependence on renal dialysis: Secondary | ICD-10-CM | POA: Diagnosis not present

## 2016-11-25 DIAGNOSIS — Z992 Dependence on renal dialysis: Secondary | ICD-10-CM | POA: Diagnosis not present

## 2016-11-25 DIAGNOSIS — N2581 Secondary hyperparathyroidism of renal origin: Secondary | ICD-10-CM | POA: Diagnosis not present

## 2016-11-25 DIAGNOSIS — D631 Anemia in chronic kidney disease: Secondary | ICD-10-CM | POA: Diagnosis not present

## 2016-11-25 DIAGNOSIS — N186 End stage renal disease: Secondary | ICD-10-CM | POA: Diagnosis not present

## 2016-11-25 DIAGNOSIS — D509 Iron deficiency anemia, unspecified: Secondary | ICD-10-CM | POA: Diagnosis not present

## 2016-11-26 ENCOUNTER — Telehealth: Payer: Self-pay | Admitting: Family Medicine

## 2016-11-26 ENCOUNTER — Other Ambulatory Visit: Payer: Self-pay | Admitting: Family Medicine

## 2016-11-26 DIAGNOSIS — Z992 Dependence on renal dialysis: Secondary | ICD-10-CM | POA: Diagnosis not present

## 2016-11-26 DIAGNOSIS — N2889 Other specified disorders of kidney and ureter: Secondary | ICD-10-CM

## 2016-11-26 DIAGNOSIS — N186 End stage renal disease: Secondary | ICD-10-CM

## 2016-11-26 DIAGNOSIS — I151 Hypertension secondary to other renal disorders: Secondary | ICD-10-CM

## 2016-11-26 DIAGNOSIS — Z794 Long term (current) use of insulin: Principal | ICD-10-CM

## 2016-11-26 DIAGNOSIS — E0822 Diabetes mellitus due to underlying condition with diabetic chronic kidney disease: Secondary | ICD-10-CM

## 2016-11-26 NOTE — Telephone Encounter (Signed)
Please contact the patient lab orders entered

## 2016-11-26 NOTE — Telephone Encounter (Signed)
Please review and advise.

## 2016-11-27 DIAGNOSIS — D509 Iron deficiency anemia, unspecified: Secondary | ICD-10-CM | POA: Diagnosis not present

## 2016-11-27 DIAGNOSIS — Z992 Dependence on renal dialysis: Secondary | ICD-10-CM | POA: Diagnosis not present

## 2016-11-27 DIAGNOSIS — N2581 Secondary hyperparathyroidism of renal origin: Secondary | ICD-10-CM | POA: Diagnosis not present

## 2016-11-27 DIAGNOSIS — D631 Anemia in chronic kidney disease: Secondary | ICD-10-CM | POA: Diagnosis not present

## 2016-11-27 DIAGNOSIS — N186 End stage renal disease: Secondary | ICD-10-CM | POA: Diagnosis not present

## 2016-11-27 NOTE — Telephone Encounter (Signed)
[email or web address]

## 2016-11-29 DIAGNOSIS — Z992 Dependence on renal dialysis: Secondary | ICD-10-CM | POA: Diagnosis not present

## 2016-11-29 DIAGNOSIS — D631 Anemia in chronic kidney disease: Secondary | ICD-10-CM | POA: Diagnosis not present

## 2016-11-29 DIAGNOSIS — N2581 Secondary hyperparathyroidism of renal origin: Secondary | ICD-10-CM | POA: Diagnosis not present

## 2016-11-29 DIAGNOSIS — D509 Iron deficiency anemia, unspecified: Secondary | ICD-10-CM | POA: Diagnosis not present

## 2016-11-29 DIAGNOSIS — N186 End stage renal disease: Secondary | ICD-10-CM | POA: Diagnosis not present

## 2016-12-02 ENCOUNTER — Encounter: Payer: Self-pay | Admitting: Pharmacist

## 2016-12-02 DIAGNOSIS — D509 Iron deficiency anemia, unspecified: Secondary | ICD-10-CM | POA: Diagnosis not present

## 2016-12-02 DIAGNOSIS — Z794 Long term (current) use of insulin: Secondary | ICD-10-CM

## 2016-12-02 DIAGNOSIS — Z992 Dependence on renal dialysis: Secondary | ICD-10-CM | POA: Diagnosis not present

## 2016-12-02 DIAGNOSIS — N186 End stage renal disease: Secondary | ICD-10-CM | POA: Diagnosis not present

## 2016-12-02 DIAGNOSIS — D631 Anemia in chronic kidney disease: Secondary | ICD-10-CM | POA: Diagnosis not present

## 2016-12-02 DIAGNOSIS — E1122 Type 2 diabetes mellitus with diabetic chronic kidney disease: Secondary | ICD-10-CM | POA: Insufficient documentation

## 2016-12-02 DIAGNOSIS — N2581 Secondary hyperparathyroidism of renal origin: Secondary | ICD-10-CM | POA: Diagnosis not present

## 2016-12-04 DIAGNOSIS — N186 End stage renal disease: Secondary | ICD-10-CM | POA: Diagnosis not present

## 2016-12-04 DIAGNOSIS — D509 Iron deficiency anemia, unspecified: Secondary | ICD-10-CM | POA: Diagnosis not present

## 2016-12-04 DIAGNOSIS — N2581 Secondary hyperparathyroidism of renal origin: Secondary | ICD-10-CM | POA: Diagnosis not present

## 2016-12-04 DIAGNOSIS — Z992 Dependence on renal dialysis: Secondary | ICD-10-CM | POA: Diagnosis not present

## 2016-12-04 DIAGNOSIS — D631 Anemia in chronic kidney disease: Secondary | ICD-10-CM | POA: Diagnosis not present

## 2016-12-06 DIAGNOSIS — N186 End stage renal disease: Secondary | ICD-10-CM | POA: Diagnosis not present

## 2016-12-06 DIAGNOSIS — Z992 Dependence on renal dialysis: Secondary | ICD-10-CM | POA: Diagnosis not present

## 2016-12-06 DIAGNOSIS — N2581 Secondary hyperparathyroidism of renal origin: Secondary | ICD-10-CM | POA: Diagnosis not present

## 2016-12-06 DIAGNOSIS — D631 Anemia in chronic kidney disease: Secondary | ICD-10-CM | POA: Diagnosis not present

## 2016-12-06 DIAGNOSIS — D509 Iron deficiency anemia, unspecified: Secondary | ICD-10-CM | POA: Diagnosis not present

## 2016-12-09 DIAGNOSIS — N186 End stage renal disease: Secondary | ICD-10-CM | POA: Diagnosis not present

## 2016-12-09 DIAGNOSIS — D509 Iron deficiency anemia, unspecified: Secondary | ICD-10-CM | POA: Diagnosis not present

## 2016-12-09 DIAGNOSIS — D631 Anemia in chronic kidney disease: Secondary | ICD-10-CM | POA: Diagnosis not present

## 2016-12-09 DIAGNOSIS — N2581 Secondary hyperparathyroidism of renal origin: Secondary | ICD-10-CM | POA: Diagnosis not present

## 2016-12-09 DIAGNOSIS — Z992 Dependence on renal dialysis: Secondary | ICD-10-CM | POA: Diagnosis not present

## 2016-12-12 ENCOUNTER — Other Ambulatory Visit: Payer: Self-pay | Admitting: *Deleted

## 2016-12-12 MED ORDER — ACCU-CHEK SOFTCLIX LANCET DEV MISC: 5 refills | Status: DC

## 2016-12-12 NOTE — Telephone Encounter (Signed)
Rx sent via fax Entering for documentation purposes

## 2016-12-13 DIAGNOSIS — D509 Iron deficiency anemia, unspecified: Secondary | ICD-10-CM | POA: Diagnosis not present

## 2016-12-13 DIAGNOSIS — Z992 Dependence on renal dialysis: Secondary | ICD-10-CM | POA: Diagnosis not present

## 2016-12-13 DIAGNOSIS — N186 End stage renal disease: Secondary | ICD-10-CM | POA: Diagnosis not present

## 2016-12-13 DIAGNOSIS — N2581 Secondary hyperparathyroidism of renal origin: Secondary | ICD-10-CM | POA: Diagnosis not present

## 2016-12-13 DIAGNOSIS — D631 Anemia in chronic kidney disease: Secondary | ICD-10-CM | POA: Diagnosis not present

## 2016-12-16 DIAGNOSIS — Z992 Dependence on renal dialysis: Secondary | ICD-10-CM | POA: Diagnosis not present

## 2016-12-16 DIAGNOSIS — N2581 Secondary hyperparathyroidism of renal origin: Secondary | ICD-10-CM | POA: Diagnosis not present

## 2016-12-16 DIAGNOSIS — N186 End stage renal disease: Secondary | ICD-10-CM | POA: Diagnosis not present

## 2016-12-16 DIAGNOSIS — D631 Anemia in chronic kidney disease: Secondary | ICD-10-CM | POA: Diagnosis not present

## 2016-12-16 DIAGNOSIS — D509 Iron deficiency anemia, unspecified: Secondary | ICD-10-CM | POA: Diagnosis not present

## 2016-12-18 DIAGNOSIS — N186 End stage renal disease: Secondary | ICD-10-CM | POA: Diagnosis not present

## 2016-12-18 DIAGNOSIS — Z992 Dependence on renal dialysis: Secondary | ICD-10-CM | POA: Diagnosis not present

## 2016-12-18 DIAGNOSIS — N2581 Secondary hyperparathyroidism of renal origin: Secondary | ICD-10-CM | POA: Diagnosis not present

## 2016-12-18 DIAGNOSIS — D631 Anemia in chronic kidney disease: Secondary | ICD-10-CM | POA: Diagnosis not present

## 2016-12-18 DIAGNOSIS — D509 Iron deficiency anemia, unspecified: Secondary | ICD-10-CM | POA: Diagnosis not present

## 2016-12-20 DIAGNOSIS — N186 End stage renal disease: Secondary | ICD-10-CM | POA: Diagnosis not present

## 2016-12-20 DIAGNOSIS — N2581 Secondary hyperparathyroidism of renal origin: Secondary | ICD-10-CM | POA: Diagnosis not present

## 2016-12-20 DIAGNOSIS — D631 Anemia in chronic kidney disease: Secondary | ICD-10-CM | POA: Diagnosis not present

## 2016-12-20 DIAGNOSIS — D509 Iron deficiency anemia, unspecified: Secondary | ICD-10-CM | POA: Diagnosis not present

## 2016-12-20 DIAGNOSIS — Z992 Dependence on renal dialysis: Secondary | ICD-10-CM | POA: Diagnosis not present

## 2016-12-23 DIAGNOSIS — N2581 Secondary hyperparathyroidism of renal origin: Secondary | ICD-10-CM | POA: Diagnosis not present

## 2016-12-23 DIAGNOSIS — N186 End stage renal disease: Secondary | ICD-10-CM | POA: Diagnosis not present

## 2016-12-23 DIAGNOSIS — D631 Anemia in chronic kidney disease: Secondary | ICD-10-CM | POA: Diagnosis not present

## 2016-12-23 DIAGNOSIS — D509 Iron deficiency anemia, unspecified: Secondary | ICD-10-CM | POA: Diagnosis not present

## 2016-12-23 DIAGNOSIS — Z992 Dependence on renal dialysis: Secondary | ICD-10-CM | POA: Diagnosis not present

## 2016-12-25 DIAGNOSIS — D631 Anemia in chronic kidney disease: Secondary | ICD-10-CM | POA: Diagnosis not present

## 2016-12-25 DIAGNOSIS — Z992 Dependence on renal dialysis: Secondary | ICD-10-CM | POA: Diagnosis not present

## 2016-12-25 DIAGNOSIS — D509 Iron deficiency anemia, unspecified: Secondary | ICD-10-CM | POA: Diagnosis not present

## 2016-12-25 DIAGNOSIS — N186 End stage renal disease: Secondary | ICD-10-CM | POA: Diagnosis not present

## 2016-12-25 DIAGNOSIS — N2581 Secondary hyperparathyroidism of renal origin: Secondary | ICD-10-CM | POA: Diagnosis not present

## 2016-12-27 DIAGNOSIS — N186 End stage renal disease: Secondary | ICD-10-CM | POA: Diagnosis not present

## 2016-12-27 DIAGNOSIS — N2581 Secondary hyperparathyroidism of renal origin: Secondary | ICD-10-CM | POA: Diagnosis not present

## 2016-12-27 DIAGNOSIS — D631 Anemia in chronic kidney disease: Secondary | ICD-10-CM | POA: Diagnosis not present

## 2016-12-27 DIAGNOSIS — Z992 Dependence on renal dialysis: Secondary | ICD-10-CM | POA: Diagnosis not present

## 2016-12-27 DIAGNOSIS — D509 Iron deficiency anemia, unspecified: Secondary | ICD-10-CM | POA: Diagnosis not present

## 2016-12-30 DIAGNOSIS — Z992 Dependence on renal dialysis: Secondary | ICD-10-CM | POA: Diagnosis not present

## 2016-12-30 DIAGNOSIS — N186 End stage renal disease: Secondary | ICD-10-CM | POA: Diagnosis not present

## 2016-12-30 DIAGNOSIS — N2581 Secondary hyperparathyroidism of renal origin: Secondary | ICD-10-CM | POA: Diagnosis not present

## 2016-12-30 DIAGNOSIS — D631 Anemia in chronic kidney disease: Secondary | ICD-10-CM | POA: Diagnosis not present

## 2016-12-30 DIAGNOSIS — D509 Iron deficiency anemia, unspecified: Secondary | ICD-10-CM | POA: Diagnosis not present

## 2017-01-01 DIAGNOSIS — D631 Anemia in chronic kidney disease: Secondary | ICD-10-CM | POA: Diagnosis not present

## 2017-01-01 DIAGNOSIS — N186 End stage renal disease: Secondary | ICD-10-CM | POA: Diagnosis not present

## 2017-01-01 DIAGNOSIS — Z992 Dependence on renal dialysis: Secondary | ICD-10-CM | POA: Diagnosis not present

## 2017-01-01 DIAGNOSIS — D509 Iron deficiency anemia, unspecified: Secondary | ICD-10-CM | POA: Diagnosis not present

## 2017-01-01 DIAGNOSIS — N2581 Secondary hyperparathyroidism of renal origin: Secondary | ICD-10-CM | POA: Diagnosis not present

## 2017-01-03 DIAGNOSIS — N2581 Secondary hyperparathyroidism of renal origin: Secondary | ICD-10-CM | POA: Diagnosis not present

## 2017-01-03 DIAGNOSIS — D509 Iron deficiency anemia, unspecified: Secondary | ICD-10-CM | POA: Diagnosis not present

## 2017-01-03 DIAGNOSIS — Z992 Dependence on renal dialysis: Secondary | ICD-10-CM | POA: Diagnosis not present

## 2017-01-03 DIAGNOSIS — D631 Anemia in chronic kidney disease: Secondary | ICD-10-CM | POA: Diagnosis not present

## 2017-01-03 DIAGNOSIS — N186 End stage renal disease: Secondary | ICD-10-CM | POA: Diagnosis not present

## 2017-01-06 DIAGNOSIS — N186 End stage renal disease: Secondary | ICD-10-CM | POA: Diagnosis not present

## 2017-01-06 DIAGNOSIS — D509 Iron deficiency anemia, unspecified: Secondary | ICD-10-CM | POA: Diagnosis not present

## 2017-01-06 DIAGNOSIS — Z992 Dependence on renal dialysis: Secondary | ICD-10-CM | POA: Diagnosis not present

## 2017-01-06 DIAGNOSIS — N2581 Secondary hyperparathyroidism of renal origin: Secondary | ICD-10-CM | POA: Diagnosis not present

## 2017-01-06 DIAGNOSIS — D631 Anemia in chronic kidney disease: Secondary | ICD-10-CM | POA: Diagnosis not present

## 2017-01-08 DIAGNOSIS — N2581 Secondary hyperparathyroidism of renal origin: Secondary | ICD-10-CM | POA: Diagnosis not present

## 2017-01-08 DIAGNOSIS — Z992 Dependence on renal dialysis: Secondary | ICD-10-CM | POA: Diagnosis not present

## 2017-01-08 DIAGNOSIS — D631 Anemia in chronic kidney disease: Secondary | ICD-10-CM | POA: Diagnosis not present

## 2017-01-08 DIAGNOSIS — D509 Iron deficiency anemia, unspecified: Secondary | ICD-10-CM | POA: Diagnosis not present

## 2017-01-08 DIAGNOSIS — N186 End stage renal disease: Secondary | ICD-10-CM | POA: Diagnosis not present

## 2017-01-09 DIAGNOSIS — M79671 Pain in right foot: Secondary | ICD-10-CM | POA: Diagnosis not present

## 2017-01-09 DIAGNOSIS — B351 Tinea unguium: Secondary | ICD-10-CM | POA: Diagnosis not present

## 2017-01-09 DIAGNOSIS — E1151 Type 2 diabetes mellitus with diabetic peripheral angiopathy without gangrene: Secondary | ICD-10-CM | POA: Diagnosis not present

## 2017-01-09 DIAGNOSIS — M79672 Pain in left foot: Secondary | ICD-10-CM | POA: Diagnosis not present

## 2017-01-10 DIAGNOSIS — N186 End stage renal disease: Secondary | ICD-10-CM | POA: Diagnosis not present

## 2017-01-10 DIAGNOSIS — D509 Iron deficiency anemia, unspecified: Secondary | ICD-10-CM | POA: Diagnosis not present

## 2017-01-10 DIAGNOSIS — N2581 Secondary hyperparathyroidism of renal origin: Secondary | ICD-10-CM | POA: Diagnosis not present

## 2017-01-10 DIAGNOSIS — D631 Anemia in chronic kidney disease: Secondary | ICD-10-CM | POA: Diagnosis not present

## 2017-01-10 DIAGNOSIS — Z992 Dependence on renal dialysis: Secondary | ICD-10-CM | POA: Diagnosis not present

## 2017-01-13 DIAGNOSIS — D509 Iron deficiency anemia, unspecified: Secondary | ICD-10-CM | POA: Diagnosis not present

## 2017-01-13 DIAGNOSIS — N2581 Secondary hyperparathyroidism of renal origin: Secondary | ICD-10-CM | POA: Diagnosis not present

## 2017-01-13 DIAGNOSIS — N186 End stage renal disease: Secondary | ICD-10-CM | POA: Diagnosis not present

## 2017-01-13 DIAGNOSIS — D631 Anemia in chronic kidney disease: Secondary | ICD-10-CM | POA: Diagnosis not present

## 2017-01-13 DIAGNOSIS — Z992 Dependence on renal dialysis: Secondary | ICD-10-CM | POA: Diagnosis not present

## 2017-01-15 DIAGNOSIS — N186 End stage renal disease: Secondary | ICD-10-CM | POA: Diagnosis not present

## 2017-01-15 DIAGNOSIS — Z992 Dependence on renal dialysis: Secondary | ICD-10-CM | POA: Diagnosis not present

## 2017-01-15 DIAGNOSIS — N2581 Secondary hyperparathyroidism of renal origin: Secondary | ICD-10-CM | POA: Diagnosis not present

## 2017-01-15 DIAGNOSIS — D631 Anemia in chronic kidney disease: Secondary | ICD-10-CM | POA: Diagnosis not present

## 2017-01-15 DIAGNOSIS — D509 Iron deficiency anemia, unspecified: Secondary | ICD-10-CM | POA: Diagnosis not present

## 2017-01-17 DIAGNOSIS — N186 End stage renal disease: Secondary | ICD-10-CM | POA: Diagnosis not present

## 2017-01-17 DIAGNOSIS — D631 Anemia in chronic kidney disease: Secondary | ICD-10-CM | POA: Diagnosis not present

## 2017-01-17 DIAGNOSIS — Z992 Dependence on renal dialysis: Secondary | ICD-10-CM | POA: Diagnosis not present

## 2017-01-17 DIAGNOSIS — N2581 Secondary hyperparathyroidism of renal origin: Secondary | ICD-10-CM | POA: Diagnosis not present

## 2017-01-17 DIAGNOSIS — D509 Iron deficiency anemia, unspecified: Secondary | ICD-10-CM | POA: Diagnosis not present

## 2017-01-20 DIAGNOSIS — D509 Iron deficiency anemia, unspecified: Secondary | ICD-10-CM | POA: Diagnosis not present

## 2017-01-20 DIAGNOSIS — Z794 Long term (current) use of insulin: Secondary | ICD-10-CM | POA: Diagnosis not present

## 2017-01-20 DIAGNOSIS — E119 Type 2 diabetes mellitus without complications: Secondary | ICD-10-CM | POA: Diagnosis not present

## 2017-01-20 DIAGNOSIS — N186 End stage renal disease: Secondary | ICD-10-CM | POA: Diagnosis not present

## 2017-01-20 DIAGNOSIS — D631 Anemia in chronic kidney disease: Secondary | ICD-10-CM | POA: Diagnosis not present

## 2017-01-20 DIAGNOSIS — N2581 Secondary hyperparathyroidism of renal origin: Secondary | ICD-10-CM | POA: Diagnosis not present

## 2017-01-20 DIAGNOSIS — Z992 Dependence on renal dialysis: Secondary | ICD-10-CM | POA: Diagnosis not present

## 2017-01-22 DIAGNOSIS — Z992 Dependence on renal dialysis: Secondary | ICD-10-CM | POA: Diagnosis not present

## 2017-01-22 DIAGNOSIS — N186 End stage renal disease: Secondary | ICD-10-CM | POA: Diagnosis not present

## 2017-01-22 DIAGNOSIS — N2581 Secondary hyperparathyroidism of renal origin: Secondary | ICD-10-CM | POA: Diagnosis not present

## 2017-01-22 DIAGNOSIS — D631 Anemia in chronic kidney disease: Secondary | ICD-10-CM | POA: Diagnosis not present

## 2017-01-22 DIAGNOSIS — D509 Iron deficiency anemia, unspecified: Secondary | ICD-10-CM | POA: Diagnosis not present

## 2017-01-24 DIAGNOSIS — D631 Anemia in chronic kidney disease: Secondary | ICD-10-CM | POA: Diagnosis not present

## 2017-01-24 DIAGNOSIS — N186 End stage renal disease: Secondary | ICD-10-CM | POA: Diagnosis not present

## 2017-01-24 DIAGNOSIS — Z992 Dependence on renal dialysis: Secondary | ICD-10-CM | POA: Diagnosis not present

## 2017-01-24 DIAGNOSIS — N2581 Secondary hyperparathyroidism of renal origin: Secondary | ICD-10-CM | POA: Diagnosis not present

## 2017-01-24 DIAGNOSIS — D509 Iron deficiency anemia, unspecified: Secondary | ICD-10-CM | POA: Diagnosis not present

## 2017-01-26 DIAGNOSIS — Z992 Dependence on renal dialysis: Secondary | ICD-10-CM | POA: Diagnosis not present

## 2017-01-26 DIAGNOSIS — N186 End stage renal disease: Secondary | ICD-10-CM | POA: Diagnosis not present

## 2017-01-27 ENCOUNTER — Other Ambulatory Visit: Payer: Self-pay

## 2017-01-27 DIAGNOSIS — Z992 Dependence on renal dialysis: Secondary | ICD-10-CM | POA: Diagnosis not present

## 2017-01-27 DIAGNOSIS — T82510A Breakdown (mechanical) of surgically created arteriovenous fistula, initial encounter: Secondary | ICD-10-CM

## 2017-01-27 DIAGNOSIS — D509 Iron deficiency anemia, unspecified: Secondary | ICD-10-CM | POA: Diagnosis not present

## 2017-01-27 DIAGNOSIS — D631 Anemia in chronic kidney disease: Secondary | ICD-10-CM | POA: Diagnosis not present

## 2017-01-27 DIAGNOSIS — Z23 Encounter for immunization: Secondary | ICD-10-CM | POA: Diagnosis not present

## 2017-01-27 DIAGNOSIS — N2581 Secondary hyperparathyroidism of renal origin: Secondary | ICD-10-CM | POA: Diagnosis not present

## 2017-01-27 DIAGNOSIS — N186 End stage renal disease: Secondary | ICD-10-CM | POA: Diagnosis not present

## 2017-01-29 ENCOUNTER — Encounter: Payer: Self-pay | Admitting: Vascular Surgery

## 2017-01-29 ENCOUNTER — Inpatient Hospital Stay (HOSPITAL_COMMUNITY): Admission: RE | Admit: 2017-01-29 | Payer: Medicare Other | Source: Ambulatory Visit

## 2017-01-29 DIAGNOSIS — D631 Anemia in chronic kidney disease: Secondary | ICD-10-CM | POA: Diagnosis not present

## 2017-01-29 DIAGNOSIS — D509 Iron deficiency anemia, unspecified: Secondary | ICD-10-CM | POA: Diagnosis not present

## 2017-01-29 DIAGNOSIS — N186 End stage renal disease: Secondary | ICD-10-CM | POA: Diagnosis not present

## 2017-01-29 DIAGNOSIS — Z992 Dependence on renal dialysis: Secondary | ICD-10-CM | POA: Diagnosis not present

## 2017-01-29 DIAGNOSIS — Z23 Encounter for immunization: Secondary | ICD-10-CM | POA: Diagnosis not present

## 2017-01-29 DIAGNOSIS — N2581 Secondary hyperparathyroidism of renal origin: Secondary | ICD-10-CM | POA: Diagnosis not present

## 2017-01-31 ENCOUNTER — Ambulatory Visit (INDEPENDENT_AMBULATORY_CARE_PROVIDER_SITE_OTHER): Payer: Medicare Other | Admitting: Vascular Surgery

## 2017-01-31 ENCOUNTER — Other Ambulatory Visit: Payer: Self-pay | Admitting: *Deleted

## 2017-01-31 ENCOUNTER — Ambulatory Visit (HOSPITAL_COMMUNITY)
Admission: RE | Admit: 2017-01-31 | Discharge: 2017-01-31 | Disposition: A | Discharge status: Home / Self Care | Payer: Medicare Other | Source: Ambulatory Visit | Attending: Vascular Surgery | Admitting: Vascular Surgery

## 2017-01-31 ENCOUNTER — Encounter: Payer: Self-pay | Admitting: Vascular Surgery

## 2017-01-31 ENCOUNTER — Encounter: Payer: Self-pay | Admitting: *Deleted

## 2017-01-31 VITALS — BP 188/76 | HR 84 | Temp 98.1°F | Resp 16 | Ht 64.0 in | Wt 257.0 lb

## 2017-01-31 DIAGNOSIS — T82510A Breakdown (mechanical) of surgically created arteriovenous fistula, initial encounter: Secondary | ICD-10-CM

## 2017-01-31 DIAGNOSIS — Y838 Other surgical procedures as the cause of abnormal reaction of the patient, or of later complication, without mention of misadventure at the time of the procedure: Secondary | ICD-10-CM | POA: Diagnosis not present

## 2017-01-31 DIAGNOSIS — D631 Anemia in chronic kidney disease: Secondary | ICD-10-CM | POA: Diagnosis not present

## 2017-01-31 DIAGNOSIS — N186 End stage renal disease: Secondary | ICD-10-CM

## 2017-01-31 DIAGNOSIS — Z992 Dependence on renal dialysis: Secondary | ICD-10-CM | POA: Diagnosis not present

## 2017-01-31 DIAGNOSIS — Z23 Encounter for immunization: Secondary | ICD-10-CM | POA: Diagnosis not present

## 2017-01-31 DIAGNOSIS — D509 Iron deficiency anemia, unspecified: Secondary | ICD-10-CM | POA: Diagnosis not present

## 2017-01-31 DIAGNOSIS — N2581 Secondary hyperparathyroidism of renal origin: Secondary | ICD-10-CM | POA: Diagnosis not present

## 2017-01-31 NOTE — Progress Notes (Signed)
Patient ID: Christy Zhang, female   DOB: 1956-03-15, 61 y.o.   MRN: [id number]  Reason for Consult: Chronic Kidney Disease   Referred by Claretta Fraise, MD  Subjective:     HPI:  Christy Zhang is a 61 y.o. female was right-hand dominant with a previous history of a right brachiocephalic AV fistula that was also superficial lysed. She is now on dialysis Monday Wednesday and Friday via this fistula. Recently she has had some difficulty with plans. She has not had any pain in her arm. She does not take any blood thinners. She was able to complete dialysis today and had ultrasound in our office prior to this visit.  Past Medical History:  Diagnosis Date  . Anemia   . Arthritis   . Asthma    in the past  . CHF (congestive heart failure) ([place])   . Chronic kidney disease    Renal failure, dialysis on M/W/F  . 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 End stage renal disease ([place])   . End stage renal disease on dialysis ([place])   . Hypertension   . Neuromuscular disorder (HCC)    neuropathy   Family History  Problem Relation Age of Onset  . Diabetes Mother   . Hypertension Mother   . Cancer Father   . Diabetes Father   . Hypertension Sister   . Diabetes Daughter   . Heart disease Daughter   . Hypertension Daughter   . Diabetes Son   . Heart disease Son   . Hypertension Son   . Peripheral vascular disease Son        amputation   Past Surgical History:  Procedure Laterality Date  . AV FISTULA PLACEMENT Right 08/25/2014   Procedure: RIGHT BRACHIOCEPHALIC ARTERIOVENOUS (AV) FISTULA CREATION;  Surgeon: Mal Misty, MD;  Location: [facility];  Service: Vascular;  Laterality: Right;  . FISTULA SUPERFICIALIZATION Right 11/03/2014   Procedure: RIGHT UPPER ARM FISTULA SUPERFICIALIZATION;  Surgeon: Mal Misty, MD;  Location: [facility];  Service: Vascular;  Laterality: Right;  . FISTULOGRAM Right 01/16/2016   Procedure: RIGHT UPPER EXTREMITY ARTERIOVENOUS FISTULOGRAM WITH  BALLOON ANGIOPLASTY;  Surgeon: Vickie Epley, MD;  Location: [facility];  Service: Vascular;  Laterality: Right;  . INSERTION OF DIALYSIS CATHETER     X4  . LIGATION OF COMPETING BRANCHES OF ARTERIOVENOUS FISTULA Right 11/03/2014   Procedure: LIGATION OF COMPETING BRANCHE x1 OF RIGHT UPPER ARM ARTERIOVENOUS FISTULA;  Surgeon: Mal Misty, MD;  Location: [facility];  Service: Vascular;  Laterality: Right;  . TUBAL LIGATION      Short Social History:  Social History  Substance Use Topics  . Smoking status: Former Smoker    Types: Cigarettes    Quit date: 07/25/1984  . Smokeless tobacco: Never Used  . Alcohol use No    No Known Allergies  Current Outpatient Prescriptions  Medication Sig Dispense Refill  . acetaminophen (TYLENOL) 500 MG tablet Take 500 mg by mouth every 8 (eight) hours as needed for mild pain.    . Albiglutide 30 MG PEN Inject 1 Dose into the skin once a week. 4 each 5  . amLODipine (NORVASC) 10 MG tablet Take 10 mg by mouth daily.    Marland Kitchen atorvastatin (LIPITOR) 40 MG tablet Take 1 tablet (40 mg total) by mouth daily. 90 tablet 3  . Blood Glucose Monitoring Suppl (BLOOD GLUCOSE MONITOR SYSTEM) w/Device KIT Use to check Blood Sugar up to 4 x daily  as directed. Dispense based on pt insurance. E11.9 1 each 0  . diphenhydrAMINE (BENADRYL) 25 mg capsule Take 25 mg by mouth every 8 (eight) hours as needed for itching.    Marland Kitchen glucose blood test strip Use as instructed 100 each 12  . Lancet Devices (ACCU-CHEK SOFTCLIX) lancets Use to check blood glucose three to four times a day 200 each 5  . Lancets Misc. KIT 1 each by Does not apply route 4 (four) times daily as needed. 120 each 11  . metoprolol succinate (TOPROL-XL) 100 MG 24 hr tablet Take 100 mg by mouth daily. Take with or immediately following a meal.    . sevelamer carbonate (RENVELA) 800 MG tablet Take 800 mg by mouth 3 (three) times daily with meals. Takes 3 pills with meals, takes 2 pills with snacks    . TOUJEO SOLOSTAR 300  UNIT/ML SOPN INJECT 80 UNITS SUBCUTANEOUSLY ONCE DAILY 6 mL 11  . ergocalciferol (VITAMIN D2) 50000 UNITS capsule Take 50,000 Units by mouth once a week.     No current facility-administered medications for this visit.    Facility-Administered Medications Ordered in Other Visits  Medication Dose Route Frequency Provider Last Rate Last Dose  . 0.9 %  sodium chloride infusion   Intravenous Continuous Mal Misty, MD        Review of Systems  Constitutional:  Constitutional negative. HENT: HENT negative.  Eyes: Eyes negative.  Respiratory: Respiratory negative.  Cardiovascular: Cardiovascular negative.  GI: Gastrointestinal negative.  Musculoskeletal: Musculoskeletal negative.  Skin: Skin negative.  Neurological: Neurological negative. Hematologic: Hematologic/lymphatic negative.  Psychiatric: Psychiatric negative.        Objective:  Objective   Vitals:   01/31/17 1605  BP: (!) 188/76  Pulse: 84  Resp: 16  Temp: 98.1 F (36.7 C)  SpO2: 96%  Weight: 257 lb (116.6 kg)  Height: 5' 4"  (1.626 m)   Body mass index is 44.11 kg/m.  Physical Exam  Constitutional: She is oriented to person, place, and time. She appears well-developed.  HENT:  Head: Normocephalic.  Eyes: Pupils are equal, round, and reactive to light.  Neck: Normal range of motion.  Cardiovascular: Normal rate.   Pulmonary/Chest: Effort normal.  Abdominal: Soft.  Musculoskeletal: Normal range of motion.  Right upper arm avf with pulsatility  Neurological: She is alert and oriented to person, place, and time.  Skin: Skin is warm and dry.  Psychiatric: She has a normal mood and affect. Her behavior is normal. Judgment and thought content normal.    Data: I have independently and her dialysis duplex evaluation today which demonstrates flow volumes of 617 mL/m. The diameter shrinks to 0.25 cm and the most cephalad aspect with a very high velocity consistent with stenosis.     Assessment/Plan:      61 year old female here for evaluation of her right arm fistula which has a stenosis and is very pulsatile by physical exam. We will proceed with fistulogram on a nondialysis day in the near future with likely intervention. I discussed the risk benefits and alternatives as well as the expected outcomes of this procedure and she demonstrates good understanding.     Waynetta Sandy MD [facility]

## 2017-02-03 DIAGNOSIS — D631 Anemia in chronic kidney disease: Secondary | ICD-10-CM | POA: Diagnosis not present

## 2017-02-03 DIAGNOSIS — N2581 Secondary hyperparathyroidism of renal origin: Secondary | ICD-10-CM | POA: Diagnosis not present

## 2017-02-03 DIAGNOSIS — Z23 Encounter for immunization: Secondary | ICD-10-CM | POA: Diagnosis not present

## 2017-02-03 DIAGNOSIS — D509 Iron deficiency anemia, unspecified: Secondary | ICD-10-CM | POA: Diagnosis not present

## 2017-02-03 DIAGNOSIS — Z992 Dependence on renal dialysis: Secondary | ICD-10-CM | POA: Diagnosis not present

## 2017-02-03 DIAGNOSIS — N186 End stage renal disease: Secondary | ICD-10-CM | POA: Diagnosis not present

## 2017-02-04 ENCOUNTER — Ambulatory Visit (HOSPITAL_COMMUNITY)
Admission: RE | Admit: 2017-02-04 | Discharge: 2017-02-04 | Disposition: A | Discharge status: Home / Self Care | Payer: Medicare Other | Source: Ambulatory Visit | Attending: Vascular Surgery | Admitting: Vascular Surgery

## 2017-02-04 ENCOUNTER — Encounter (HOSPITAL_COMMUNITY): Payer: Self-pay | Admitting: *Deleted

## 2017-02-04 ENCOUNTER — Encounter (HOSPITAL_COMMUNITY)
Admission: RE | Disposition: A | Discharge status: Home / Self Care | Payer: Self-pay | Source: Ambulatory Visit | Attending: Vascular Surgery

## 2017-02-04 ENCOUNTER — Other Ambulatory Visit: Payer: Self-pay

## 2017-02-04 ENCOUNTER — Telehealth: Payer: Self-pay | Admitting: Vascular Surgery

## 2017-02-04 DIAGNOSIS — N186 End stage renal disease: Secondary | ICD-10-CM | POA: Insufficient documentation

## 2017-02-04 DIAGNOSIS — T82898A Other specified complication of vascular prosthetic devices, implants and grafts, initial encounter: Secondary | ICD-10-CM | POA: Diagnosis not present

## 2017-02-04 DIAGNOSIS — Y832 Surgical operation with anastomosis, bypass or graft as the cause of abnormal reaction of the patient, or of later complication, without mention of misadventure at the time of the procedure: Secondary | ICD-10-CM | POA: Insufficient documentation

## 2017-02-04 DIAGNOSIS — T82858A Stenosis of vascular prosthetic devices, implants and grafts, initial encounter: Secondary | ICD-10-CM | POA: Insufficient documentation

## 2017-02-04 DIAGNOSIS — N185 Chronic kidney disease, stage 5: Secondary | ICD-10-CM | POA: Diagnosis not present

## 2017-02-04 HISTORY — PX: A/V FISTULAGRAM: CATH118298

## 2017-02-04 HISTORY — PX: PERIPHERAL VASCULAR BALLOON ANGIOPLASTY: CATH118281

## 2017-02-04 LAB — POCT I-STAT, CHEM 8
BUN: 43 mg/dL — ABNORMAL HIGH (ref 6–20)
Calcium, Ion: 0.96 mmol/L — ABNORMAL LOW (ref 1.15–1.40)
Chloride: 99 mmol/L — ABNORMAL LOW (ref 101–111)
Creatinine, Ser: 5.8 mg/dL — ABNORMAL HIGH (ref 0.44–1.00)
Glucose, Bld: 183 mg/dL — ABNORMAL HIGH (ref 65–99)
HCT: 38 % (ref 36.0–46.0)
Hemoglobin: 12.9 g/dL (ref 12.0–15.0)
Potassium: 5.7 mmol/L — ABNORMAL HIGH (ref 3.5–5.1)
Sodium: 136 mmol/L (ref 135–145)
TCO2: 33 mmol/L — ABNORMAL HIGH (ref 22–32)

## 2017-02-04 SURGERY — A/V FISTULAGRAM
Anesthesia: LOCAL | Laterality: Right

## 2017-02-04 MED ORDER — HEPARIN (PORCINE) IN NACL 2-0.9 UNIT/ML-% IJ SOLN
INTRAMUSCULAR | Status: AC | PRN
  Administered 2017-02-04: 500 mL via INTRA_ARTERIAL

## 2017-02-04 MED ORDER — HEPARIN SODIUM (PORCINE) 1000 UNIT/ML IJ SOLN
INTRAMUSCULAR | Status: AC
  Filled 2017-02-04: qty 1

## 2017-02-04 MED ORDER — LIDOCAINE HCL 2 % IJ SOLN
INTRAMUSCULAR | Status: DC | PRN
  Administered 2017-02-04: 2 mL via INTRADERMAL

## 2017-02-04 MED ORDER — SODIUM CHLORIDE 0.9% FLUSH: 3.0000 mL | INTRAVENOUS | Status: DC | PRN

## 2017-02-04 MED ORDER — HEPARIN (PORCINE) IN NACL 2-0.9 UNIT/ML-% IJ SOLN
INTRAMUSCULAR | Status: AC
  Filled 2017-02-04: qty 500

## 2017-02-04 MED ORDER — LIDOCAINE HCL 2 % IJ SOLN
INTRAMUSCULAR | Status: AC
  Filled 2017-02-04: qty 10

## 2017-02-04 MED ORDER — HEPARIN SODIUM (PORCINE) 1000 UNIT/ML IJ SOLN
INTRAMUSCULAR | Status: DC | PRN
  Administered 2017-02-04: 3000 [IU] via INTRAVENOUS

## 2017-02-04 MED ORDER — IODIXANOL 320 MG/ML IV SOLN
INTRAVENOUS | Status: DC | PRN
  Administered 2017-02-04: 35 mL

## 2017-02-04 SURGICAL SUPPLY — 22 items
BAG SNAP BAND KOVER 36X36 (MISCELLANEOUS) ×2 IMPLANT
BALLN ANGIOSCULPT OTW 8.0X40 (BALLOONS) ×2
BALLN LUTONIX AV 8X60X75 (BALLOONS) ×2
BALLN MUSTANG 7X80X75 (BALLOONS) ×2
BALLN MUSTANG 8X20X75 (BALLOONS) ×2
BALLOON ANGIOSCULPT OTW 8.0X40 (BALLOONS) ×1 IMPLANT
BALLOON LUTONIX AV 8X60X75 (BALLOONS) ×1 IMPLANT
BALLOON MUSTANG 7X80X75 (BALLOONS) ×1 IMPLANT
BALLOON MUSTANG 8X20X75 (BALLOONS) ×1 IMPLANT
COVER DOME SNAP 22 D (MISCELLANEOUS) ×2 IMPLANT
COVER PRB 48X5XTLSCP FOLD TPE (BAG) ×1 IMPLANT
COVER PROBE 5X48 (BAG) ×1
KIT ENCORE 26 ADVANTAGE (KITS) ×2 IMPLANT
KIT MICROINTRODUCER STIFF 5F (SHEATH) ×2 IMPLANT
PROTECTION STATION PRESSURIZED (MISCELLANEOUS) ×2
SHEATH PINNACLE R/O II 7F 4CM (SHEATH) ×2 IMPLANT
STATION PROTECTION PRESSURIZED (MISCELLANEOUS) ×1 IMPLANT
STOPCOCK MORSE 400PSI 3WAY (MISCELLANEOUS) ×2 IMPLANT
TRAY PV CATH (CUSTOM PROCEDURE TRAY) ×2 IMPLANT
TUBING CIL FLEX 10 FLL-RA (TUBING) ×2 IMPLANT
WIRE BENTSON .035X145CM (WIRE) ×2 IMPLANT
WIRE SPARTACORE .014X190CM (WIRE) ×2 IMPLANT

## 2017-02-04 NOTE — Telephone Encounter (Signed)
-----   Message from Mena Goes, RN sent at 02/04/2017  9:38 AM EDT ----- Regarding: 3-4 with duplex   ----- Message ----- From: Waynetta Sandy, MD Sent: 02/04/2017   8:25 AM To: [facility] Charge Zakiyah Diop [phone] 05/03/1955  02/04/2017 Pre-operative Diagnosis: esrd, malfunction of right arm avf  Surgeon:  Eda Paschal. Donzetta Matters, MD  Procedure Performed: 1.  US guided acces right arm avf 2.  Scoring and drug coated balloon angioplasty of left arm avf with 2mm balloons  F/u in 3-4 weeks with repeat dialysis duplex

## 2017-02-04 NOTE — Progress Notes (Signed)
K+ 5.7 reported to Dr Kasandra Knudsen.

## 2017-02-04 NOTE — Op Note (Signed)
    Patient name: Christy Zhang MRN: [id number] DOB: 1955/05/23 Sex: female  02/04/2017 Pre-operative Diagnosis: esrd, malfunction of right arm avf Post-operative diagnosis:  Same Surgeon:  Eda Paschal. Donzetta Matters, MD Procedure Performed: 1.  US guided acces right arm avf 2.  Scoring and drug coated balloon angioplasty of left arm avf with 66m balloons  Indications:  89yofemale with a history of a right arm AV fistula that was also superficialized and now has slow clearance times and also withdrawing clot.  Findings: There is a stenotic. Of the fistula as well as 1 valve. Centrally there is some reflux of contrast however there is no notable stenosis. The stenotic area in the upper arm is approximately 60% reduced to less than 30% although there remains 1 tight band. Thrill was significantly improved at completion.  I'll have her follow-up with repeat fistula duplex and consideration of primary stenting versus open repair revision of the fistula if she continues to have issues.   Procedure:  The patient was identified in the holding area and taken to room 8.  The patient was then placed supine on the table and prepped and draped in the usual sterile fashion.  A time out was called.  Ultrasound was used to evaluate the right arm AV fistula this was then cannulated with a micropuncture needle followed by wire and sheath. We then performed fistulogram with the above findings. We then placed a Bentson pass the stenotic areas exchanged for 7 French sheath first predilated the lesion with a 7 mm balloon and this was noted to have a very tight waist. We then dilated this with then 8 mm scoring balloon but did not relieve the waist. We then elected to place an 8 mm drug-coated balloon that was 60 mm in length. This was inflated for 2 minutes. This came down we then used a small 3 mm Mustang balloon to high pressure 24 atm. This inflated for 2 minutes still did not relieve the very tight band. She will need  considered for open revision or stenting if the fistula still has issues. There is a significantly improved thrill at completion.  Contrast: 35cc     Theodora Lalanne C. CDonzetta Matters MD [facility]: [phone]: [phone]

## 2017-02-04 NOTE — Telephone Encounter (Signed)
Sched lab 03/03/17 at 4:00 and MD 03/07/17 at 12:00. Spoke to pt.

## 2017-02-04 NOTE — Discharge Instructions (Signed)

## 2017-02-04 NOTE — H&P (Signed)
   History and Physical Update  The patient was interviewed and re-examined.  The patient's previous History and Physical has been reviewed and is unchanged from previous. Plan for right arm fistulogram with possible intervention.  Alexiah Koroma C. Donzetta Matters, MD [facility] Office: [phone] Pager: [phone]   02/04/2017, 7:25 AM

## 2017-02-05 ENCOUNTER — Other Ambulatory Visit: Payer: Self-pay

## 2017-02-05 DIAGNOSIS — D631 Anemia in chronic kidney disease: Secondary | ICD-10-CM | POA: Diagnosis not present

## 2017-02-05 DIAGNOSIS — D509 Iron deficiency anemia, unspecified: Secondary | ICD-10-CM | POA: Diagnosis not present

## 2017-02-05 DIAGNOSIS — Z992 Dependence on renal dialysis: Secondary | ICD-10-CM | POA: Diagnosis not present

## 2017-02-05 DIAGNOSIS — N2581 Secondary hyperparathyroidism of renal origin: Secondary | ICD-10-CM | POA: Diagnosis not present

## 2017-02-05 DIAGNOSIS — N186 End stage renal disease: Secondary | ICD-10-CM | POA: Diagnosis not present

## 2017-02-05 DIAGNOSIS — Z48812 Encounter for surgical aftercare following surgery on the circulatory system: Secondary | ICD-10-CM

## 2017-02-05 DIAGNOSIS — Z23 Encounter for immunization: Secondary | ICD-10-CM | POA: Diagnosis not present

## 2017-02-07 DIAGNOSIS — N186 End stage renal disease: Secondary | ICD-10-CM | POA: Diagnosis not present

## 2017-02-07 DIAGNOSIS — D509 Iron deficiency anemia, unspecified: Secondary | ICD-10-CM | POA: Diagnosis not present

## 2017-02-07 DIAGNOSIS — D631 Anemia in chronic kidney disease: Secondary | ICD-10-CM | POA: Diagnosis not present

## 2017-02-07 DIAGNOSIS — N2581 Secondary hyperparathyroidism of renal origin: Secondary | ICD-10-CM | POA: Diagnosis not present

## 2017-02-07 DIAGNOSIS — Z992 Dependence on renal dialysis: Secondary | ICD-10-CM | POA: Diagnosis not present

## 2017-02-07 DIAGNOSIS — Z23 Encounter for immunization: Secondary | ICD-10-CM | POA: Diagnosis not present

## 2017-02-10 DIAGNOSIS — N186 End stage renal disease: Secondary | ICD-10-CM | POA: Diagnosis not present

## 2017-02-10 DIAGNOSIS — Z992 Dependence on renal dialysis: Secondary | ICD-10-CM | POA: Diagnosis not present

## 2017-02-10 DIAGNOSIS — D509 Iron deficiency anemia, unspecified: Secondary | ICD-10-CM | POA: Diagnosis not present

## 2017-02-10 DIAGNOSIS — N2581 Secondary hyperparathyroidism of renal origin: Secondary | ICD-10-CM | POA: Diagnosis not present

## 2017-02-10 DIAGNOSIS — Z23 Encounter for immunization: Secondary | ICD-10-CM | POA: Diagnosis not present

## 2017-02-10 DIAGNOSIS — D631 Anemia in chronic kidney disease: Secondary | ICD-10-CM | POA: Diagnosis not present

## 2017-02-12 DIAGNOSIS — D509 Iron deficiency anemia, unspecified: Secondary | ICD-10-CM | POA: Diagnosis not present

## 2017-02-12 DIAGNOSIS — Z992 Dependence on renal dialysis: Secondary | ICD-10-CM | POA: Diagnosis not present

## 2017-02-12 DIAGNOSIS — N2581 Secondary hyperparathyroidism of renal origin: Secondary | ICD-10-CM | POA: Diagnosis not present

## 2017-02-12 DIAGNOSIS — N186 End stage renal disease: Secondary | ICD-10-CM | POA: Diagnosis not present

## 2017-02-12 DIAGNOSIS — D631 Anemia in chronic kidney disease: Secondary | ICD-10-CM | POA: Diagnosis not present

## 2017-02-12 DIAGNOSIS — Z23 Encounter for immunization: Secondary | ICD-10-CM | POA: Diagnosis not present

## 2017-02-14 DIAGNOSIS — N186 End stage renal disease: Secondary | ICD-10-CM | POA: Diagnosis not present

## 2017-02-14 DIAGNOSIS — D631 Anemia in chronic kidney disease: Secondary | ICD-10-CM | POA: Diagnosis not present

## 2017-02-14 DIAGNOSIS — Z23 Encounter for immunization: Secondary | ICD-10-CM | POA: Diagnosis not present

## 2017-02-14 DIAGNOSIS — N2581 Secondary hyperparathyroidism of renal origin: Secondary | ICD-10-CM | POA: Diagnosis not present

## 2017-02-14 DIAGNOSIS — Z992 Dependence on renal dialysis: Secondary | ICD-10-CM | POA: Diagnosis not present

## 2017-02-14 DIAGNOSIS — D509 Iron deficiency anemia, unspecified: Secondary | ICD-10-CM | POA: Diagnosis not present

## 2017-02-17 DIAGNOSIS — N186 End stage renal disease: Secondary | ICD-10-CM | POA: Diagnosis not present

## 2017-02-17 DIAGNOSIS — Z992 Dependence on renal dialysis: Secondary | ICD-10-CM | POA: Diagnosis not present

## 2017-02-17 DIAGNOSIS — Z23 Encounter for immunization: Secondary | ICD-10-CM | POA: Diagnosis not present

## 2017-02-17 DIAGNOSIS — N2581 Secondary hyperparathyroidism of renal origin: Secondary | ICD-10-CM | POA: Diagnosis not present

## 2017-02-17 DIAGNOSIS — D509 Iron deficiency anemia, unspecified: Secondary | ICD-10-CM | POA: Diagnosis not present

## 2017-02-17 DIAGNOSIS — D631 Anemia in chronic kidney disease: Secondary | ICD-10-CM | POA: Diagnosis not present

## 2017-02-19 DIAGNOSIS — D509 Iron deficiency anemia, unspecified: Secondary | ICD-10-CM | POA: Diagnosis not present

## 2017-02-19 DIAGNOSIS — N2581 Secondary hyperparathyroidism of renal origin: Secondary | ICD-10-CM | POA: Diagnosis not present

## 2017-02-19 DIAGNOSIS — D631 Anemia in chronic kidney disease: Secondary | ICD-10-CM | POA: Diagnosis not present

## 2017-02-19 DIAGNOSIS — Z23 Encounter for immunization: Secondary | ICD-10-CM | POA: Diagnosis not present

## 2017-02-19 DIAGNOSIS — N186 End stage renal disease: Secondary | ICD-10-CM | POA: Diagnosis not present

## 2017-02-19 DIAGNOSIS — Z992 Dependence on renal dialysis: Secondary | ICD-10-CM | POA: Diagnosis not present

## 2017-02-21 DIAGNOSIS — N2581 Secondary hyperparathyroidism of renal origin: Secondary | ICD-10-CM | POA: Diagnosis not present

## 2017-02-21 DIAGNOSIS — D631 Anemia in chronic kidney disease: Secondary | ICD-10-CM | POA: Diagnosis not present

## 2017-02-21 DIAGNOSIS — N186 End stage renal disease: Secondary | ICD-10-CM | POA: Diagnosis not present

## 2017-02-21 DIAGNOSIS — Z992 Dependence on renal dialysis: Secondary | ICD-10-CM | POA: Diagnosis not present

## 2017-02-21 DIAGNOSIS — Z23 Encounter for immunization: Secondary | ICD-10-CM | POA: Diagnosis not present

## 2017-02-21 DIAGNOSIS — D509 Iron deficiency anemia, unspecified: Secondary | ICD-10-CM | POA: Diagnosis not present

## 2017-02-24 DIAGNOSIS — D509 Iron deficiency anemia, unspecified: Secondary | ICD-10-CM | POA: Diagnosis not present

## 2017-02-24 DIAGNOSIS — N186 End stage renal disease: Secondary | ICD-10-CM | POA: Diagnosis not present

## 2017-02-24 DIAGNOSIS — Z992 Dependence on renal dialysis: Secondary | ICD-10-CM | POA: Diagnosis not present

## 2017-02-24 DIAGNOSIS — N2581 Secondary hyperparathyroidism of renal origin: Secondary | ICD-10-CM | POA: Diagnosis not present

## 2017-02-24 DIAGNOSIS — D631 Anemia in chronic kidney disease: Secondary | ICD-10-CM | POA: Diagnosis not present

## 2017-02-24 DIAGNOSIS — Z23 Encounter for immunization: Secondary | ICD-10-CM | POA: Diagnosis not present

## 2017-02-25 DIAGNOSIS — Z992 Dependence on renal dialysis: Secondary | ICD-10-CM | POA: Diagnosis not present

## 2017-02-25 DIAGNOSIS — N186 End stage renal disease: Secondary | ICD-10-CM | POA: Diagnosis not present

## 2017-02-26 DIAGNOSIS — N2581 Secondary hyperparathyroidism of renal origin: Secondary | ICD-10-CM | POA: Diagnosis not present

## 2017-02-26 DIAGNOSIS — D631 Anemia in chronic kidney disease: Secondary | ICD-10-CM | POA: Diagnosis not present

## 2017-02-26 DIAGNOSIS — Z23 Encounter for immunization: Secondary | ICD-10-CM | POA: Diagnosis not present

## 2017-02-26 DIAGNOSIS — Z992 Dependence on renal dialysis: Secondary | ICD-10-CM | POA: Diagnosis not present

## 2017-02-26 DIAGNOSIS — D509 Iron deficiency anemia, unspecified: Secondary | ICD-10-CM | POA: Diagnosis not present

## 2017-02-26 DIAGNOSIS — N186 End stage renal disease: Secondary | ICD-10-CM | POA: Diagnosis not present

## 2017-02-28 DIAGNOSIS — D509 Iron deficiency anemia, unspecified: Secondary | ICD-10-CM | POA: Diagnosis not present

## 2017-02-28 DIAGNOSIS — D631 Anemia in chronic kidney disease: Secondary | ICD-10-CM | POA: Diagnosis not present

## 2017-02-28 DIAGNOSIS — Z992 Dependence on renal dialysis: Secondary | ICD-10-CM | POA: Diagnosis not present

## 2017-02-28 DIAGNOSIS — N186 End stage renal disease: Secondary | ICD-10-CM | POA: Diagnosis not present

## 2017-02-28 DIAGNOSIS — N2581 Secondary hyperparathyroidism of renal origin: Secondary | ICD-10-CM | POA: Diagnosis not present

## 2017-03-03 ENCOUNTER — Encounter (HOSPITAL_COMMUNITY): Payer: Medicare Other

## 2017-03-03 DIAGNOSIS — N2581 Secondary hyperparathyroidism of renal origin: Secondary | ICD-10-CM | POA: Diagnosis not present

## 2017-03-03 DIAGNOSIS — N186 End stage renal disease: Secondary | ICD-10-CM | POA: Diagnosis not present

## 2017-03-03 DIAGNOSIS — D509 Iron deficiency anemia, unspecified: Secondary | ICD-10-CM | POA: Diagnosis not present

## 2017-03-03 DIAGNOSIS — Z992 Dependence on renal dialysis: Secondary | ICD-10-CM | POA: Diagnosis not present

## 2017-03-03 DIAGNOSIS — D631 Anemia in chronic kidney disease: Secondary | ICD-10-CM | POA: Diagnosis not present

## 2017-03-05 DIAGNOSIS — N186 End stage renal disease: Secondary | ICD-10-CM | POA: Diagnosis not present

## 2017-03-05 DIAGNOSIS — D631 Anemia in chronic kidney disease: Secondary | ICD-10-CM | POA: Diagnosis not present

## 2017-03-05 DIAGNOSIS — Z992 Dependence on renal dialysis: Secondary | ICD-10-CM | POA: Diagnosis not present

## 2017-03-05 DIAGNOSIS — N2581 Secondary hyperparathyroidism of renal origin: Secondary | ICD-10-CM | POA: Diagnosis not present

## 2017-03-05 DIAGNOSIS — D509 Iron deficiency anemia, unspecified: Secondary | ICD-10-CM | POA: Diagnosis not present

## 2017-03-07 ENCOUNTER — Encounter: Payer: Medicare Other | Admitting: Vascular Surgery

## 2017-03-07 DIAGNOSIS — D631 Anemia in chronic kidney disease: Secondary | ICD-10-CM | POA: Diagnosis not present

## 2017-03-07 DIAGNOSIS — Z992 Dependence on renal dialysis: Secondary | ICD-10-CM | POA: Diagnosis not present

## 2017-03-07 DIAGNOSIS — N186 End stage renal disease: Secondary | ICD-10-CM | POA: Diagnosis not present

## 2017-03-07 DIAGNOSIS — D509 Iron deficiency anemia, unspecified: Secondary | ICD-10-CM | POA: Diagnosis not present

## 2017-03-07 DIAGNOSIS — N2581 Secondary hyperparathyroidism of renal origin: Secondary | ICD-10-CM | POA: Diagnosis not present

## 2017-03-10 DIAGNOSIS — D509 Iron deficiency anemia, unspecified: Secondary | ICD-10-CM | POA: Diagnosis not present

## 2017-03-10 DIAGNOSIS — D631 Anemia in chronic kidney disease: Secondary | ICD-10-CM | POA: Diagnosis not present

## 2017-03-10 DIAGNOSIS — N2581 Secondary hyperparathyroidism of renal origin: Secondary | ICD-10-CM | POA: Diagnosis not present

## 2017-03-10 DIAGNOSIS — Z992 Dependence on renal dialysis: Secondary | ICD-10-CM | POA: Diagnosis not present

## 2017-03-10 DIAGNOSIS — N186 End stage renal disease: Secondary | ICD-10-CM | POA: Diagnosis not present

## 2017-03-12 DIAGNOSIS — N186 End stage renal disease: Secondary | ICD-10-CM | POA: Diagnosis not present

## 2017-03-12 DIAGNOSIS — N2581 Secondary hyperparathyroidism of renal origin: Secondary | ICD-10-CM | POA: Diagnosis not present

## 2017-03-12 DIAGNOSIS — D631 Anemia in chronic kidney disease: Secondary | ICD-10-CM | POA: Diagnosis not present

## 2017-03-12 DIAGNOSIS — D509 Iron deficiency anemia, unspecified: Secondary | ICD-10-CM | POA: Diagnosis not present

## 2017-03-12 DIAGNOSIS — Z992 Dependence on renal dialysis: Secondary | ICD-10-CM | POA: Diagnosis not present

## 2017-03-14 DIAGNOSIS — D509 Iron deficiency anemia, unspecified: Secondary | ICD-10-CM | POA: Diagnosis not present

## 2017-03-14 DIAGNOSIS — N2581 Secondary hyperparathyroidism of renal origin: Secondary | ICD-10-CM | POA: Diagnosis not present

## 2017-03-14 DIAGNOSIS — Z992 Dependence on renal dialysis: Secondary | ICD-10-CM | POA: Diagnosis not present

## 2017-03-14 DIAGNOSIS — N186 End stage renal disease: Secondary | ICD-10-CM | POA: Diagnosis not present

## 2017-03-14 DIAGNOSIS — D631 Anemia in chronic kidney disease: Secondary | ICD-10-CM | POA: Diagnosis not present

## 2017-03-17 DIAGNOSIS — D509 Iron deficiency anemia, unspecified: Secondary | ICD-10-CM | POA: Diagnosis not present

## 2017-03-17 DIAGNOSIS — N186 End stage renal disease: Secondary | ICD-10-CM | POA: Diagnosis not present

## 2017-03-17 DIAGNOSIS — D631 Anemia in chronic kidney disease: Secondary | ICD-10-CM | POA: Diagnosis not present

## 2017-03-17 DIAGNOSIS — N2581 Secondary hyperparathyroidism of renal origin: Secondary | ICD-10-CM | POA: Diagnosis not present

## 2017-03-17 DIAGNOSIS — Z992 Dependence on renal dialysis: Secondary | ICD-10-CM | POA: Diagnosis not present

## 2017-03-19 DIAGNOSIS — D631 Anemia in chronic kidney disease: Secondary | ICD-10-CM | POA: Diagnosis not present

## 2017-03-19 DIAGNOSIS — N186 End stage renal disease: Secondary | ICD-10-CM | POA: Diagnosis not present

## 2017-03-19 DIAGNOSIS — Z992 Dependence on renal dialysis: Secondary | ICD-10-CM | POA: Diagnosis not present

## 2017-03-19 DIAGNOSIS — N2581 Secondary hyperparathyroidism of renal origin: Secondary | ICD-10-CM | POA: Diagnosis not present

## 2017-03-19 DIAGNOSIS — D509 Iron deficiency anemia, unspecified: Secondary | ICD-10-CM | POA: Diagnosis not present

## 2017-03-21 DIAGNOSIS — D631 Anemia in chronic kidney disease: Secondary | ICD-10-CM | POA: Diagnosis not present

## 2017-03-21 DIAGNOSIS — N186 End stage renal disease: Secondary | ICD-10-CM | POA: Diagnosis not present

## 2017-03-21 DIAGNOSIS — Z992 Dependence on renal dialysis: Secondary | ICD-10-CM | POA: Diagnosis not present

## 2017-03-21 DIAGNOSIS — D509 Iron deficiency anemia, unspecified: Secondary | ICD-10-CM | POA: Diagnosis not present

## 2017-03-21 DIAGNOSIS — N2581 Secondary hyperparathyroidism of renal origin: Secondary | ICD-10-CM | POA: Diagnosis not present

## 2017-03-24 DIAGNOSIS — Z992 Dependence on renal dialysis: Secondary | ICD-10-CM | POA: Diagnosis not present

## 2017-03-24 DIAGNOSIS — N2581 Secondary hyperparathyroidism of renal origin: Secondary | ICD-10-CM | POA: Diagnosis not present

## 2017-03-24 DIAGNOSIS — D631 Anemia in chronic kidney disease: Secondary | ICD-10-CM | POA: Diagnosis not present

## 2017-03-24 DIAGNOSIS — N186 End stage renal disease: Secondary | ICD-10-CM | POA: Diagnosis not present

## 2017-03-24 DIAGNOSIS — D509 Iron deficiency anemia, unspecified: Secondary | ICD-10-CM | POA: Diagnosis not present

## 2017-03-26 DIAGNOSIS — Z992 Dependence on renal dialysis: Secondary | ICD-10-CM | POA: Diagnosis not present

## 2017-03-26 DIAGNOSIS — D631 Anemia in chronic kidney disease: Secondary | ICD-10-CM | POA: Diagnosis not present

## 2017-03-26 DIAGNOSIS — D509 Iron deficiency anemia, unspecified: Secondary | ICD-10-CM | POA: Diagnosis not present

## 2017-03-26 DIAGNOSIS — N2581 Secondary hyperparathyroidism of renal origin: Secondary | ICD-10-CM | POA: Diagnosis not present

## 2017-03-26 DIAGNOSIS — N186 End stage renal disease: Secondary | ICD-10-CM | POA: Diagnosis not present

## 2017-03-28 DIAGNOSIS — N2581 Secondary hyperparathyroidism of renal origin: Secondary | ICD-10-CM | POA: Diagnosis not present

## 2017-03-28 DIAGNOSIS — D509 Iron deficiency anemia, unspecified: Secondary | ICD-10-CM | POA: Diagnosis not present

## 2017-03-28 DIAGNOSIS — N186 End stage renal disease: Secondary | ICD-10-CM | POA: Diagnosis not present

## 2017-03-28 DIAGNOSIS — Z992 Dependence on renal dialysis: Secondary | ICD-10-CM | POA: Diagnosis not present

## 2017-03-28 DIAGNOSIS — D631 Anemia in chronic kidney disease: Secondary | ICD-10-CM | POA: Diagnosis not present

## 2017-03-31 DIAGNOSIS — Z992 Dependence on renal dialysis: Secondary | ICD-10-CM | POA: Diagnosis not present

## 2017-03-31 DIAGNOSIS — N2581 Secondary hyperparathyroidism of renal origin: Secondary | ICD-10-CM | POA: Diagnosis not present

## 2017-03-31 DIAGNOSIS — D509 Iron deficiency anemia, unspecified: Secondary | ICD-10-CM | POA: Diagnosis not present

## 2017-03-31 DIAGNOSIS — D631 Anemia in chronic kidney disease: Secondary | ICD-10-CM | POA: Diagnosis not present

## 2017-03-31 DIAGNOSIS — N186 End stage renal disease: Secondary | ICD-10-CM | POA: Diagnosis not present

## 2017-04-02 DIAGNOSIS — D631 Anemia in chronic kidney disease: Secondary | ICD-10-CM | POA: Diagnosis not present

## 2017-04-02 DIAGNOSIS — N2581 Secondary hyperparathyroidism of renal origin: Secondary | ICD-10-CM | POA: Diagnosis not present

## 2017-04-02 DIAGNOSIS — Z992 Dependence on renal dialysis: Secondary | ICD-10-CM | POA: Diagnosis not present

## 2017-04-02 DIAGNOSIS — N186 End stage renal disease: Secondary | ICD-10-CM | POA: Diagnosis not present

## 2017-04-02 DIAGNOSIS — D509 Iron deficiency anemia, unspecified: Secondary | ICD-10-CM | POA: Diagnosis not present

## 2017-04-04 DIAGNOSIS — N186 End stage renal disease: Secondary | ICD-10-CM | POA: Diagnosis not present

## 2017-04-04 DIAGNOSIS — D509 Iron deficiency anemia, unspecified: Secondary | ICD-10-CM | POA: Diagnosis not present

## 2017-04-04 DIAGNOSIS — Z992 Dependence on renal dialysis: Secondary | ICD-10-CM | POA: Diagnosis not present

## 2017-04-04 DIAGNOSIS — N2581 Secondary hyperparathyroidism of renal origin: Secondary | ICD-10-CM | POA: Diagnosis not present

## 2017-04-04 DIAGNOSIS — D631 Anemia in chronic kidney disease: Secondary | ICD-10-CM | POA: Diagnosis not present

## 2017-04-05 DIAGNOSIS — D509 Iron deficiency anemia, unspecified: Secondary | ICD-10-CM | POA: Diagnosis not present

## 2017-04-05 DIAGNOSIS — N186 End stage renal disease: Secondary | ICD-10-CM | POA: Diagnosis not present

## 2017-04-05 DIAGNOSIS — D631 Anemia in chronic kidney disease: Secondary | ICD-10-CM | POA: Diagnosis not present

## 2017-04-05 DIAGNOSIS — N2581 Secondary hyperparathyroidism of renal origin: Secondary | ICD-10-CM | POA: Diagnosis not present

## 2017-04-05 DIAGNOSIS — Z992 Dependence on renal dialysis: Secondary | ICD-10-CM | POA: Diagnosis not present

## 2017-04-09 DIAGNOSIS — D631 Anemia in chronic kidney disease: Secondary | ICD-10-CM | POA: Diagnosis not present

## 2017-04-09 DIAGNOSIS — D509 Iron deficiency anemia, unspecified: Secondary | ICD-10-CM | POA: Diagnosis not present

## 2017-04-09 DIAGNOSIS — Z992 Dependence on renal dialysis: Secondary | ICD-10-CM | POA: Diagnosis not present

## 2017-04-09 DIAGNOSIS — N2581 Secondary hyperparathyroidism of renal origin: Secondary | ICD-10-CM | POA: Diagnosis not present

## 2017-04-09 DIAGNOSIS — N186 End stage renal disease: Secondary | ICD-10-CM | POA: Diagnosis not present

## 2017-04-11 DIAGNOSIS — D509 Iron deficiency anemia, unspecified: Secondary | ICD-10-CM | POA: Diagnosis not present

## 2017-04-11 DIAGNOSIS — D631 Anemia in chronic kidney disease: Secondary | ICD-10-CM | POA: Diagnosis not present

## 2017-04-11 DIAGNOSIS — N186 End stage renal disease: Secondary | ICD-10-CM | POA: Diagnosis not present

## 2017-04-11 DIAGNOSIS — N2581 Secondary hyperparathyroidism of renal origin: Secondary | ICD-10-CM | POA: Diagnosis not present

## 2017-04-11 DIAGNOSIS — Z992 Dependence on renal dialysis: Secondary | ICD-10-CM | POA: Diagnosis not present

## 2017-04-14 DIAGNOSIS — E119 Type 2 diabetes mellitus without complications: Secondary | ICD-10-CM | POA: Diagnosis not present

## 2017-04-14 DIAGNOSIS — Z992 Dependence on renal dialysis: Secondary | ICD-10-CM | POA: Diagnosis not present

## 2017-04-14 DIAGNOSIS — D509 Iron deficiency anemia, unspecified: Secondary | ICD-10-CM | POA: Diagnosis not present

## 2017-04-14 DIAGNOSIS — N2581 Secondary hyperparathyroidism of renal origin: Secondary | ICD-10-CM | POA: Diagnosis not present

## 2017-04-14 DIAGNOSIS — Z794 Long term (current) use of insulin: Secondary | ICD-10-CM | POA: Diagnosis not present

## 2017-04-14 DIAGNOSIS — N186 End stage renal disease: Secondary | ICD-10-CM | POA: Diagnosis not present

## 2017-04-14 DIAGNOSIS — D631 Anemia in chronic kidney disease: Secondary | ICD-10-CM | POA: Diagnosis not present

## 2017-04-16 DIAGNOSIS — N2581 Secondary hyperparathyroidism of renal origin: Secondary | ICD-10-CM | POA: Diagnosis not present

## 2017-04-16 DIAGNOSIS — Z992 Dependence on renal dialysis: Secondary | ICD-10-CM | POA: Diagnosis not present

## 2017-04-16 DIAGNOSIS — D631 Anemia in chronic kidney disease: Secondary | ICD-10-CM | POA: Diagnosis not present

## 2017-04-16 DIAGNOSIS — N186 End stage renal disease: Secondary | ICD-10-CM | POA: Diagnosis not present

## 2017-04-16 DIAGNOSIS — D509 Iron deficiency anemia, unspecified: Secondary | ICD-10-CM | POA: Diagnosis not present

## 2017-04-18 DIAGNOSIS — N186 End stage renal disease: Secondary | ICD-10-CM | POA: Diagnosis not present

## 2017-04-18 DIAGNOSIS — N2581 Secondary hyperparathyroidism of renal origin: Secondary | ICD-10-CM | POA: Diagnosis not present

## 2017-04-18 DIAGNOSIS — Z992 Dependence on renal dialysis: Secondary | ICD-10-CM | POA: Diagnosis not present

## 2017-04-18 DIAGNOSIS — D509 Iron deficiency anemia, unspecified: Secondary | ICD-10-CM | POA: Diagnosis not present

## 2017-04-18 DIAGNOSIS — D631 Anemia in chronic kidney disease: Secondary | ICD-10-CM | POA: Diagnosis not present

## 2017-04-20 DIAGNOSIS — Z992 Dependence on renal dialysis: Secondary | ICD-10-CM | POA: Diagnosis not present

## 2017-04-20 DIAGNOSIS — D631 Anemia in chronic kidney disease: Secondary | ICD-10-CM | POA: Diagnosis not present

## 2017-04-20 DIAGNOSIS — N2581 Secondary hyperparathyroidism of renal origin: Secondary | ICD-10-CM | POA: Diagnosis not present

## 2017-04-20 DIAGNOSIS — N186 End stage renal disease: Secondary | ICD-10-CM | POA: Diagnosis not present

## 2017-04-20 DIAGNOSIS — D509 Iron deficiency anemia, unspecified: Secondary | ICD-10-CM | POA: Diagnosis not present

## 2017-04-23 DIAGNOSIS — D631 Anemia in chronic kidney disease: Secondary | ICD-10-CM | POA: Diagnosis not present

## 2017-04-23 DIAGNOSIS — Z992 Dependence on renal dialysis: Secondary | ICD-10-CM | POA: Diagnosis not present

## 2017-04-23 DIAGNOSIS — D509 Iron deficiency anemia, unspecified: Secondary | ICD-10-CM | POA: Diagnosis not present

## 2017-04-23 DIAGNOSIS — N2581 Secondary hyperparathyroidism of renal origin: Secondary | ICD-10-CM | POA: Diagnosis not present

## 2017-04-23 DIAGNOSIS — N186 End stage renal disease: Secondary | ICD-10-CM | POA: Diagnosis not present

## 2017-04-25 DIAGNOSIS — Z992 Dependence on renal dialysis: Secondary | ICD-10-CM | POA: Diagnosis not present

## 2017-04-25 DIAGNOSIS — N186 End stage renal disease: Secondary | ICD-10-CM | POA: Diagnosis not present

## 2017-04-25 DIAGNOSIS — D509 Iron deficiency anemia, unspecified: Secondary | ICD-10-CM | POA: Diagnosis not present

## 2017-04-25 DIAGNOSIS — D631 Anemia in chronic kidney disease: Secondary | ICD-10-CM | POA: Diagnosis not present

## 2017-04-25 DIAGNOSIS — N2581 Secondary hyperparathyroidism of renal origin: Secondary | ICD-10-CM | POA: Diagnosis not present

## 2017-04-27 DIAGNOSIS — D509 Iron deficiency anemia, unspecified: Secondary | ICD-10-CM | POA: Diagnosis not present

## 2017-04-27 DIAGNOSIS — Z992 Dependence on renal dialysis: Secondary | ICD-10-CM | POA: Diagnosis not present

## 2017-04-27 DIAGNOSIS — N2581 Secondary hyperparathyroidism of renal origin: Secondary | ICD-10-CM | POA: Diagnosis not present

## 2017-04-27 DIAGNOSIS — D631 Anemia in chronic kidney disease: Secondary | ICD-10-CM | POA: Diagnosis not present

## 2017-04-27 DIAGNOSIS — N186 End stage renal disease: Secondary | ICD-10-CM | POA: Diagnosis not present

## 2017-04-28 DIAGNOSIS — N186 End stage renal disease: Secondary | ICD-10-CM | POA: Diagnosis not present

## 2017-04-28 DIAGNOSIS — Z992 Dependence on renal dialysis: Secondary | ICD-10-CM | POA: Diagnosis not present

## 2017-04-30 DIAGNOSIS — N186 End stage renal disease: Secondary | ICD-10-CM | POA: Diagnosis not present

## 2017-04-30 DIAGNOSIS — N2581 Secondary hyperparathyroidism of renal origin: Secondary | ICD-10-CM | POA: Diagnosis not present

## 2017-04-30 DIAGNOSIS — Z992 Dependence on renal dialysis: Secondary | ICD-10-CM | POA: Diagnosis not present

## 2017-04-30 DIAGNOSIS — D509 Iron deficiency anemia, unspecified: Secondary | ICD-10-CM | POA: Diagnosis not present

## 2017-04-30 DIAGNOSIS — D631 Anemia in chronic kidney disease: Secondary | ICD-10-CM | POA: Diagnosis not present

## 2017-05-02 DIAGNOSIS — N186 End stage renal disease: Secondary | ICD-10-CM | POA: Diagnosis not present

## 2017-05-02 DIAGNOSIS — D631 Anemia in chronic kidney disease: Secondary | ICD-10-CM | POA: Diagnosis not present

## 2017-05-02 DIAGNOSIS — Z992 Dependence on renal dialysis: Secondary | ICD-10-CM | POA: Diagnosis not present

## 2017-05-02 DIAGNOSIS — N2581 Secondary hyperparathyroidism of renal origin: Secondary | ICD-10-CM | POA: Diagnosis not present

## 2017-05-02 DIAGNOSIS — D509 Iron deficiency anemia, unspecified: Secondary | ICD-10-CM | POA: Diagnosis not present

## 2017-05-05 DIAGNOSIS — Z992 Dependence on renal dialysis: Secondary | ICD-10-CM | POA: Diagnosis not present

## 2017-05-05 DIAGNOSIS — D509 Iron deficiency anemia, unspecified: Secondary | ICD-10-CM | POA: Diagnosis not present

## 2017-05-05 DIAGNOSIS — N186 End stage renal disease: Secondary | ICD-10-CM | POA: Diagnosis not present

## 2017-05-05 DIAGNOSIS — D631 Anemia in chronic kidney disease: Secondary | ICD-10-CM | POA: Diagnosis not present

## 2017-05-05 DIAGNOSIS — N2581 Secondary hyperparathyroidism of renal origin: Secondary | ICD-10-CM | POA: Diagnosis not present

## 2017-05-07 DIAGNOSIS — D631 Anemia in chronic kidney disease: Secondary | ICD-10-CM | POA: Diagnosis not present

## 2017-05-07 DIAGNOSIS — N186 End stage renal disease: Secondary | ICD-10-CM | POA: Diagnosis not present

## 2017-05-07 DIAGNOSIS — D509 Iron deficiency anemia, unspecified: Secondary | ICD-10-CM | POA: Diagnosis not present

## 2017-05-07 DIAGNOSIS — N2581 Secondary hyperparathyroidism of renal origin: Secondary | ICD-10-CM | POA: Diagnosis not present

## 2017-05-07 DIAGNOSIS — Z992 Dependence on renal dialysis: Secondary | ICD-10-CM | POA: Diagnosis not present

## 2017-05-09 DIAGNOSIS — N186 End stage renal disease: Secondary | ICD-10-CM | POA: Diagnosis not present

## 2017-05-09 DIAGNOSIS — D509 Iron deficiency anemia, unspecified: Secondary | ICD-10-CM | POA: Diagnosis not present

## 2017-05-09 DIAGNOSIS — Z992 Dependence on renal dialysis: Secondary | ICD-10-CM | POA: Diagnosis not present

## 2017-05-09 DIAGNOSIS — D631 Anemia in chronic kidney disease: Secondary | ICD-10-CM | POA: Diagnosis not present

## 2017-05-09 DIAGNOSIS — N2581 Secondary hyperparathyroidism of renal origin: Secondary | ICD-10-CM | POA: Diagnosis not present

## 2017-05-12 DIAGNOSIS — D631 Anemia in chronic kidney disease: Secondary | ICD-10-CM | POA: Diagnosis not present

## 2017-05-12 DIAGNOSIS — N186 End stage renal disease: Secondary | ICD-10-CM | POA: Diagnosis not present

## 2017-05-12 DIAGNOSIS — D509 Iron deficiency anemia, unspecified: Secondary | ICD-10-CM | POA: Diagnosis not present

## 2017-05-12 DIAGNOSIS — N2581 Secondary hyperparathyroidism of renal origin: Secondary | ICD-10-CM | POA: Diagnosis not present

## 2017-05-12 DIAGNOSIS — Z992 Dependence on renal dialysis: Secondary | ICD-10-CM | POA: Diagnosis not present

## 2017-05-14 DIAGNOSIS — D631 Anemia in chronic kidney disease: Secondary | ICD-10-CM | POA: Diagnosis not present

## 2017-05-14 DIAGNOSIS — N186 End stage renal disease: Secondary | ICD-10-CM | POA: Diagnosis not present

## 2017-05-14 DIAGNOSIS — N2581 Secondary hyperparathyroidism of renal origin: Secondary | ICD-10-CM | POA: Diagnosis not present

## 2017-05-14 DIAGNOSIS — Z992 Dependence on renal dialysis: Secondary | ICD-10-CM | POA: Diagnosis not present

## 2017-05-14 DIAGNOSIS — D509 Iron deficiency anemia, unspecified: Secondary | ICD-10-CM | POA: Diagnosis not present

## 2017-05-16 DIAGNOSIS — D631 Anemia in chronic kidney disease: Secondary | ICD-10-CM | POA: Diagnosis not present

## 2017-05-16 DIAGNOSIS — N2581 Secondary hyperparathyroidism of renal origin: Secondary | ICD-10-CM | POA: Diagnosis not present

## 2017-05-16 DIAGNOSIS — N186 End stage renal disease: Secondary | ICD-10-CM | POA: Diagnosis not present

## 2017-05-16 DIAGNOSIS — Z992 Dependence on renal dialysis: Secondary | ICD-10-CM | POA: Diagnosis not present

## 2017-05-16 DIAGNOSIS — D509 Iron deficiency anemia, unspecified: Secondary | ICD-10-CM | POA: Diagnosis not present

## 2017-05-21 DIAGNOSIS — D509 Iron deficiency anemia, unspecified: Secondary | ICD-10-CM | POA: Diagnosis not present

## 2017-05-21 DIAGNOSIS — Z992 Dependence on renal dialysis: Secondary | ICD-10-CM | POA: Diagnosis not present

## 2017-05-21 DIAGNOSIS — N2581 Secondary hyperparathyroidism of renal origin: Secondary | ICD-10-CM | POA: Diagnosis not present

## 2017-05-21 DIAGNOSIS — D631 Anemia in chronic kidney disease: Secondary | ICD-10-CM | POA: Diagnosis not present

## 2017-05-21 DIAGNOSIS — N186 End stage renal disease: Secondary | ICD-10-CM | POA: Diagnosis not present

## 2017-05-23 DIAGNOSIS — D631 Anemia in chronic kidney disease: Secondary | ICD-10-CM | POA: Diagnosis not present

## 2017-05-23 DIAGNOSIS — D509 Iron deficiency anemia, unspecified: Secondary | ICD-10-CM | POA: Diagnosis not present

## 2017-05-23 DIAGNOSIS — N2581 Secondary hyperparathyroidism of renal origin: Secondary | ICD-10-CM | POA: Diagnosis not present

## 2017-05-23 DIAGNOSIS — N186 End stage renal disease: Secondary | ICD-10-CM | POA: Diagnosis not present

## 2017-05-23 DIAGNOSIS — Z992 Dependence on renal dialysis: Secondary | ICD-10-CM | POA: Diagnosis not present

## 2017-05-26 DIAGNOSIS — D631 Anemia in chronic kidney disease: Secondary | ICD-10-CM | POA: Diagnosis not present

## 2017-05-26 DIAGNOSIS — D509 Iron deficiency anemia, unspecified: Secondary | ICD-10-CM | POA: Diagnosis not present

## 2017-05-26 DIAGNOSIS — N186 End stage renal disease: Secondary | ICD-10-CM | POA: Diagnosis not present

## 2017-05-26 DIAGNOSIS — N2581 Secondary hyperparathyroidism of renal origin: Secondary | ICD-10-CM | POA: Diagnosis not present

## 2017-05-26 DIAGNOSIS — Z992 Dependence on renal dialysis: Secondary | ICD-10-CM | POA: Diagnosis not present

## 2017-05-28 DIAGNOSIS — D631 Anemia in chronic kidney disease: Secondary | ICD-10-CM | POA: Diagnosis not present

## 2017-05-28 DIAGNOSIS — D509 Iron deficiency anemia, unspecified: Secondary | ICD-10-CM | POA: Diagnosis not present

## 2017-05-28 DIAGNOSIS — N186 End stage renal disease: Secondary | ICD-10-CM | POA: Diagnosis not present

## 2017-05-28 DIAGNOSIS — N2581 Secondary hyperparathyroidism of renal origin: Secondary | ICD-10-CM | POA: Diagnosis not present

## 2017-05-28 DIAGNOSIS — Z992 Dependence on renal dialysis: Secondary | ICD-10-CM | POA: Diagnosis not present

## 2017-05-29 DIAGNOSIS — N186 End stage renal disease: Secondary | ICD-10-CM | POA: Diagnosis not present

## 2017-05-29 DIAGNOSIS — Z992 Dependence on renal dialysis: Secondary | ICD-10-CM | POA: Diagnosis not present

## 2017-05-30 DIAGNOSIS — D631 Anemia in chronic kidney disease: Secondary | ICD-10-CM | POA: Diagnosis not present

## 2017-05-30 DIAGNOSIS — N2581 Secondary hyperparathyroidism of renal origin: Secondary | ICD-10-CM | POA: Diagnosis not present

## 2017-05-30 DIAGNOSIS — Z992 Dependence on renal dialysis: Secondary | ICD-10-CM | POA: Diagnosis not present

## 2017-05-30 DIAGNOSIS — N186 End stage renal disease: Secondary | ICD-10-CM | POA: Diagnosis not present

## 2017-05-30 DIAGNOSIS — D509 Iron deficiency anemia, unspecified: Secondary | ICD-10-CM | POA: Diagnosis not present

## 2017-06-02 DIAGNOSIS — Z992 Dependence on renal dialysis: Secondary | ICD-10-CM | POA: Diagnosis not present

## 2017-06-02 DIAGNOSIS — D509 Iron deficiency anemia, unspecified: Secondary | ICD-10-CM | POA: Diagnosis not present

## 2017-06-02 DIAGNOSIS — N186 End stage renal disease: Secondary | ICD-10-CM | POA: Diagnosis not present

## 2017-06-02 DIAGNOSIS — N2581 Secondary hyperparathyroidism of renal origin: Secondary | ICD-10-CM | POA: Diagnosis not present

## 2017-06-02 DIAGNOSIS — D631 Anemia in chronic kidney disease: Secondary | ICD-10-CM | POA: Diagnosis not present

## 2017-06-04 DIAGNOSIS — D631 Anemia in chronic kidney disease: Secondary | ICD-10-CM | POA: Diagnosis not present

## 2017-06-04 DIAGNOSIS — D509 Iron deficiency anemia, unspecified: Secondary | ICD-10-CM | POA: Diagnosis not present

## 2017-06-04 DIAGNOSIS — N2581 Secondary hyperparathyroidism of renal origin: Secondary | ICD-10-CM | POA: Diagnosis not present

## 2017-06-04 DIAGNOSIS — Z992 Dependence on renal dialysis: Secondary | ICD-10-CM | POA: Diagnosis not present

## 2017-06-04 DIAGNOSIS — N186 End stage renal disease: Secondary | ICD-10-CM | POA: Diagnosis not present

## 2017-06-06 DIAGNOSIS — Z992 Dependence on renal dialysis: Secondary | ICD-10-CM | POA: Diagnosis not present

## 2017-06-06 DIAGNOSIS — D631 Anemia in chronic kidney disease: Secondary | ICD-10-CM | POA: Diagnosis not present

## 2017-06-06 DIAGNOSIS — N2581 Secondary hyperparathyroidism of renal origin: Secondary | ICD-10-CM | POA: Diagnosis not present

## 2017-06-06 DIAGNOSIS — D509 Iron deficiency anemia, unspecified: Secondary | ICD-10-CM | POA: Diagnosis not present

## 2017-06-06 DIAGNOSIS — N186 End stage renal disease: Secondary | ICD-10-CM | POA: Diagnosis not present

## 2017-06-09 DIAGNOSIS — D509 Iron deficiency anemia, unspecified: Secondary | ICD-10-CM | POA: Diagnosis not present

## 2017-06-09 DIAGNOSIS — Z992 Dependence on renal dialysis: Secondary | ICD-10-CM | POA: Diagnosis not present

## 2017-06-09 DIAGNOSIS — N2581 Secondary hyperparathyroidism of renal origin: Secondary | ICD-10-CM | POA: Diagnosis not present

## 2017-06-09 DIAGNOSIS — D631 Anemia in chronic kidney disease: Secondary | ICD-10-CM | POA: Diagnosis not present

## 2017-06-09 DIAGNOSIS — N186 End stage renal disease: Secondary | ICD-10-CM | POA: Diagnosis not present

## 2017-06-11 DIAGNOSIS — Z992 Dependence on renal dialysis: Secondary | ICD-10-CM | POA: Diagnosis not present

## 2017-06-11 DIAGNOSIS — D509 Iron deficiency anemia, unspecified: Secondary | ICD-10-CM | POA: Diagnosis not present

## 2017-06-11 DIAGNOSIS — D631 Anemia in chronic kidney disease: Secondary | ICD-10-CM | POA: Diagnosis not present

## 2017-06-11 DIAGNOSIS — N186 End stage renal disease: Secondary | ICD-10-CM | POA: Diagnosis not present

## 2017-06-11 DIAGNOSIS — N2581 Secondary hyperparathyroidism of renal origin: Secondary | ICD-10-CM | POA: Diagnosis not present

## 2017-06-13 DIAGNOSIS — N186 End stage renal disease: Secondary | ICD-10-CM | POA: Diagnosis not present

## 2017-06-13 DIAGNOSIS — D631 Anemia in chronic kidney disease: Secondary | ICD-10-CM | POA: Diagnosis not present

## 2017-06-13 DIAGNOSIS — D509 Iron deficiency anemia, unspecified: Secondary | ICD-10-CM | POA: Diagnosis not present

## 2017-06-13 DIAGNOSIS — Z992 Dependence on renal dialysis: Secondary | ICD-10-CM | POA: Diagnosis not present

## 2017-06-13 DIAGNOSIS — N2581 Secondary hyperparathyroidism of renal origin: Secondary | ICD-10-CM | POA: Diagnosis not present

## 2017-06-16 DIAGNOSIS — D509 Iron deficiency anemia, unspecified: Secondary | ICD-10-CM | POA: Diagnosis not present

## 2017-06-16 DIAGNOSIS — N186 End stage renal disease: Secondary | ICD-10-CM | POA: Diagnosis not present

## 2017-06-16 DIAGNOSIS — N2581 Secondary hyperparathyroidism of renal origin: Secondary | ICD-10-CM | POA: Diagnosis not present

## 2017-06-16 DIAGNOSIS — Z992 Dependence on renal dialysis: Secondary | ICD-10-CM | POA: Diagnosis not present

## 2017-06-16 DIAGNOSIS — D631 Anemia in chronic kidney disease: Secondary | ICD-10-CM | POA: Diagnosis not present

## 2017-06-18 DIAGNOSIS — D631 Anemia in chronic kidney disease: Secondary | ICD-10-CM | POA: Diagnosis not present

## 2017-06-18 DIAGNOSIS — N2581 Secondary hyperparathyroidism of renal origin: Secondary | ICD-10-CM | POA: Diagnosis not present

## 2017-06-18 DIAGNOSIS — Z992 Dependence on renal dialysis: Secondary | ICD-10-CM | POA: Diagnosis not present

## 2017-06-18 DIAGNOSIS — N186 End stage renal disease: Secondary | ICD-10-CM | POA: Diagnosis not present

## 2017-06-18 DIAGNOSIS — D509 Iron deficiency anemia, unspecified: Secondary | ICD-10-CM | POA: Diagnosis not present

## 2017-06-19 ENCOUNTER — Ambulatory Visit (INDEPENDENT_AMBULATORY_CARE_PROVIDER_SITE_OTHER): Payer: Medicare Other | Admitting: Family Medicine

## 2017-06-19 ENCOUNTER — Encounter: Payer: Self-pay | Admitting: Family Medicine

## 2017-06-19 ENCOUNTER — Other Ambulatory Visit: Payer: Self-pay | Admitting: Family Medicine

## 2017-06-19 VITALS — BP 167/67 | HR 76 | Temp 97.1°F | Ht 64.0 in | Wt 264.0 lb

## 2017-06-19 DIAGNOSIS — E1169 Type 2 diabetes mellitus with other specified complication: Secondary | ICD-10-CM

## 2017-06-19 DIAGNOSIS — E1122 Type 2 diabetes mellitus with diabetic chronic kidney disease: Secondary | ICD-10-CM | POA: Diagnosis not present

## 2017-06-19 DIAGNOSIS — E785 Hyperlipidemia, unspecified: Secondary | ICD-10-CM | POA: Diagnosis not present

## 2017-06-19 DIAGNOSIS — Z794 Long term (current) use of insulin: Secondary | ICD-10-CM | POA: Diagnosis not present

## 2017-06-19 DIAGNOSIS — IMO0002 Reserved for concepts with insufficient information to code with codable children: Secondary | ICD-10-CM

## 2017-06-19 DIAGNOSIS — Z992 Dependence on renal dialysis: Secondary | ICD-10-CM

## 2017-06-19 DIAGNOSIS — N186 End stage renal disease: Secondary | ICD-10-CM

## 2017-06-19 DIAGNOSIS — N2889 Other specified disorders of kidney and ureter: Secondary | ICD-10-CM

## 2017-06-19 DIAGNOSIS — E1165 Type 2 diabetes mellitus with hyperglycemia: Principal | ICD-10-CM

## 2017-06-19 DIAGNOSIS — I151 Hypertension secondary to other renal disorders: Secondary | ICD-10-CM

## 2017-06-19 DIAGNOSIS — N185 Chronic kidney disease, stage 5: Principal | ICD-10-CM

## 2017-06-19 LAB — BAYER DCA HB A1C WAIVED: HB A1C (BAYER DCA - WAIVED): 9.9 % — ABNORMAL HIGH (ref <7.0)

## 2017-06-19 MED ORDER — GLUCOSE BLOOD VI STRP: ORAL_STRIP | 12 refills | Status: DC

## 2017-06-19 MED ORDER — METOPROLOL SUCCINATE ER 100 MG PO TB24: 100.0000 mg | ORAL_TABLET | Freq: Every day | ORAL | 5 refills | Status: DC

## 2017-06-19 MED ORDER — BLOOD GLUCOSE MONITOR SYSTEM W/DEVICE KIT: PACK | 0 refills | Status: DC

## 2017-06-19 MED ORDER — DULAGLUTIDE 0.75 MG/0.5ML ~~LOC~~ SOAJ
0.5000 mL | SUBCUTANEOUS | 2 refills | Status: DC
Start: 2017-06-19 — End: 2017-10-14

## 2017-06-19 MED ORDER — ATORVASTATIN CALCIUM 40 MG PO TABS: 40.0000 mg | ORAL_TABLET | Freq: Every day | ORAL | 3 refills | Status: DC

## 2017-06-19 MED ORDER — AMLODIPINE BESYLATE 10 MG PO TABS: 10.0000 mg | ORAL_TABLET | Freq: Every day | ORAL | 5 refills | Status: DC

## 2017-06-19 MED ORDER — LANCETS MISC. KIT: 1.0000 | PACK | Freq: Four times a day (QID) | 11 refills | Status: DC | PRN

## 2017-06-19 NOTE — Progress Notes (Signed)
Subjective:  Patient ID: Christy Zhang, female    DOB: [birth year]  Age: 62 y.o. MRN: [id number]  CC: Diabetes (pt here today for routine follow up of her chronic medical conditions)   HPI Christy Zhang presents forFollow-up of diabetes. Patient does not check blood sugar at home.  atient denies symptoms such as polyuria, polydipsia, excessive hunger, nausea No significant hypoglycemic spells noted. Medications reviewed. Pt reports taking them regularly without complication/adverse reaction being reported today.  Checking feet daily. Last eye appt overdue  Pt. Under care of Dr. Hinda Lenis of nephrology for dialysis for end stage renal dx.  History Christy Zhang has a past medical history of Anemia, Arthritis, Asthma, CHF (congestive heart failure) ([place]), Chronic kidney disease, Diabetes mellitus with complication ([place]), End stage renal disease ([place]), End stage renal disease on dialysis [facility]), Hypertension, and Neuromuscular disorder ([place]).   She has a past surgical history that includes Insertion of dialysis catheter; Tubal ligation; AV fistula placement (Right, 08/25/2014); Fistula superficialization (Right, 11/03/2014); Ligation of competing branches of arteriovenous fistula (Right, 11/03/2014); Fistulogram (Right, 01/16/2016); A/V Fistulagram (Right, 02/04/2017); and PERIPHERAL VASCULAR BALLOON ANGIOPLASTY (Right, 02/04/2017).   Her family history includes Cancer in her father; Diabetes in her daughter, father, mother, and son; Heart disease in her daughter and son; Hypertension in her daughter, mother, sister, and son; Peripheral vascular disease in her son.She reports that she quit smoking about 32 years ago. Her smoking use included cigarettes. she has never used smokeless tobacco. She reports that she does not drink alcohol or use drugs.  Current Outpatient Medications on File Prior to Visit  Medication Sig Dispense Refill  . acetaminophen (TYLENOL) 500 MG tablet Take 500 mg by mouth every 8  (eight) hours as needed for mild pain.    . diphenhydrAMINE (BENADRYL) 25 mg capsule Take 25 mg by mouth every 8 (eight) hours as needed for itching.    . sevelamer carbonate (RENVELA) 800 MG tablet Take 800 mg by mouth 3 (three) times daily with meals. Takes 3 pills with meals, takes 2 pills with snacks    . TOUJEO SOLOSTAR 300 UNIT/ML SOPN INJECT 80 UNITS SUBCUTANEOUSLY ONCE DAILY 6 mL 11   No current facility-administered medications on file prior to visit.     ROS Review of Systems  Constitutional: Negative for activity change, appetite change and fever.  HENT: Negative for congestion, rhinorrhea and sore throat.   Eyes: Negative for visual disturbance.  Respiratory: Negative for cough and shortness of breath.   Cardiovascular: Negative for chest pain and palpitations.  Gastrointestinal: Negative for abdominal pain, diarrhea and nausea.  Genitourinary: Negative for dysuria.  Musculoskeletal: Negative for arthralgias and myalgias.    Objective:  BP (!) 167/67   Pulse 76   Temp (!) 97.1 F (36.2 C) (Oral)   Ht 5' 4"  (1.626 m)   Wt 264 lb (119.7 kg)   BMI 45.32 kg/m   BP Readings from Last 3 Encounters:  06/19/17 (!) 167/67  02/04/17 121/84  01/31/17 (!) 188/76    Wt Readings from Last 3 Encounters:  06/19/17 264 lb (119.7 kg)  02/04/17 258 lb (117 kg)  01/31/17 257 lb (116.6 kg)     Physical Exam  Constitutional: She is oriented to person, place, and time. She appears well-developed and well-nourished. No distress.  HENT:  Head: Normocephalic and atraumatic.  Right Ear: External ear normal.  Left Ear: External ear normal.  Nose: Nose normal.  Mouth/Throat: Oropharynx is clear and moist.  Eyes: Conjunctivae and EOM are  normal. Pupils are equal, round, and reactive to light.  Neck: Normal range of motion. Neck supple. No thyromegaly present.  Cardiovascular: Normal rate, regular rhythm and normal heart sounds.  No murmur heard. Pulmonary/Chest: Effort normal and  breath sounds normal. No respiratory distress. She has no wheezes. She has no rales.  Abdominal: Soft. Bowel sounds are normal. She exhibits no distension. There is no tenderness.  Lymphadenopathy:    She has no cervical adenopathy.  Neurological: She is alert and oriented to person, place, and time. She has normal reflexes.  Skin: Skin is warm and dry.  Psychiatric: She has a normal mood and affect. Her behavior is normal. Judgment and thought content normal.      Assessment & Plan:   Christy Zhang was seen today for diabetes.  Diagnoses and all orders for this visit:  Type 2 diabetes mellitus with chronic kidney disease on chronic dialysis, with long-term current use of insulin (HCC) -     Bayer DCA Hb A1c Waived  Hypertension secondary to other renal disorders -     CBC with Differential/Platelet -     CMP14+EGFR  End stage renal disease on dialysis ([place])  Hyperlipidemia associated with type 2 diabetes mellitus ([place]) -     Lipid panel  Other orders -     Dulaglutide (TRULICITY) 2.92 KM/6.2MM SOPN; Inject 0.5 mLs into the skin once a week. -     amLODipine (NORVASC) 10 MG tablet; Take 1 tablet (10 mg total) by mouth daily. -     atorvastatin (LIPITOR) 40 MG tablet; Take 1 tablet (40 mg total) by mouth daily. -     Blood Glucose Monitoring Suppl (BLOOD GLUCOSE MONITOR SYSTEM) w/Device KIT; Use to check Blood Sugar up to 4 x daily as directed. Dispense based on pt insurance. E11.9 -     Discontinue: glucose blood test strip; Use as instructed -     Lancets Misc. KIT; 1 each by Does not apply route 4 (four) times daily as needed. -     metoprolol succinate (TOPROL-XL) 100 MG 24 hr tablet; Take 1 tablet (100 mg total) by mouth daily. Take with or immediately following a meal. -     glucose blood test strip; Use to test blood sugars QID      I have discontinued Christy Zhang's glucose blood and accu-chek softclix. I have also changed her amLODipine, metoprolol succinate, and  glucose blood. Additionally, I am having her start on Dulaglutide. Lastly, I am having her maintain her diphenhydrAMINE, acetaminophen, sevelamer carbonate, TOUJEO SOLOSTAR, atorvastatin, Blood Glucose Monitor System, and Lancets Misc..  Meds ordered this encounter  Medications  . Dulaglutide (TRULICITY) 3.81 RR/1.1AF SOPN    Sig: Inject 0.5 mLs into the skin once a week.    Dispense:  4 pen    Refill:  2  . amLODipine (NORVASC) 10 MG tablet    Sig: Take 1 tablet (10 mg total) by mouth daily.    Dispense:  30 tablet    Refill:  5  . atorvastatin (LIPITOR) 40 MG tablet    Sig: Take 1 tablet (40 mg total) by mouth daily.    Dispense:  90 tablet    Refill:  3  . Blood Glucose Monitoring Suppl (BLOOD GLUCOSE MONITOR SYSTEM) w/Device KIT    Sig: Use to check Blood Sugar up to 4 x daily as directed. Dispense based on pt insurance. E11.9    Dispense:  1 each    Refill:  0  . DISCONTD:  glucose blood test strip    Sig: Use as instructed    Dispense:  100 each    Refill:  12  . Lancets Misc. KIT    Sig: 1 each by Does not apply route 4 (four) times daily as needed.    Dispense:  120 each    Refill:  11  . metoprolol succinate (TOPROL-XL) 100 MG 24 hr tablet    Sig: Take 1 tablet (100 mg total) by mouth daily. Take with or immediately following a meal.    Dispense:  30 tablet    Refill:  5  . glucose blood test strip    Sig: Use to test blood sugars QID    Dispense:  100 each    Refill:  12    Dx E11.9     Follow-up: Return in about 3 months (around 09/16/2017).  Claretta Fraise, M.D.

## 2017-06-20 ENCOUNTER — Telehealth: Payer: Self-pay | Admitting: Family Medicine

## 2017-06-20 DIAGNOSIS — N2581 Secondary hyperparathyroidism of renal origin: Secondary | ICD-10-CM | POA: Diagnosis not present

## 2017-06-20 DIAGNOSIS — D509 Iron deficiency anemia, unspecified: Secondary | ICD-10-CM | POA: Diagnosis not present

## 2017-06-20 DIAGNOSIS — Z992 Dependence on renal dialysis: Secondary | ICD-10-CM | POA: Diagnosis not present

## 2017-06-20 DIAGNOSIS — N186 End stage renal disease: Secondary | ICD-10-CM | POA: Diagnosis not present

## 2017-06-20 DIAGNOSIS — D631 Anemia in chronic kidney disease: Secondary | ICD-10-CM | POA: Diagnosis not present

## 2017-06-20 LAB — CMP14+EGFR
ALT: 5 IU/L (ref 0–32)
AST: 8 IU/L (ref 0–40)
Albumin/Globulin Ratio: 1.2 (ref 1.2–2.2)
Albumin: 4.2 g/dL (ref 3.6–4.8)
Alkaline Phosphatase: 91 IU/L (ref 39–117)
BUN/Creatinine Ratio: 5 — ABNORMAL LOW (ref 12–28)
BUN: 26 mg/dL (ref 8–27)
Bilirubin Total: 0.4 mg/dL (ref 0.0–1.2)
CO2: 23 mmol/L (ref 20–29)
Calcium: 9.4 mg/dL (ref 8.7–10.3)
Chloride: 97 mmol/L (ref 96–106)
Creatinine, Ser: 5.46 mg/dL (ref 0.57–1.00)
GFR calc Af Amer: 9 mL/min/{1.73_m2} — ABNORMAL LOW (ref >59)
GFR calc non Af Amer: 8 mL/min/{1.73_m2} — ABNORMAL LOW (ref >59)
Globulin, Total: 3.5 g/dL (ref 1.5–4.5)
Glucose: 246 mg/dL — ABNORMAL HIGH (ref 65–99)
Potassium: 4.7 mmol/L (ref 3.5–5.2)
Sodium: 139 mmol/L (ref 134–144)
Total Protein: 7.7 g/dL (ref 6.0–8.5)

## 2017-06-20 LAB — CBC WITH DIFFERENTIAL/PLATELET
Basophils Absolute: 0 10*3/uL (ref 0.0–0.2)
Basos: 0 %
EOS (ABSOLUTE): 0.4 10*3/uL (ref 0.0–0.4)
Eos: 3 %
Hematocrit: 34.4 % (ref 34.0–46.6)
Hemoglobin: 11.1 g/dL (ref 11.1–15.9)
Immature Grans (Abs): 0 10*3/uL (ref 0.0–0.1)
Immature Granulocytes: 0 %
Lymphocytes Absolute: 2.2 10*3/uL (ref 0.7–3.1)
Lymphs: 18 %
MCH: 27.5 pg (ref 26.6–33.0)
MCHC: 32.3 g/dL (ref 31.5–35.7)
MCV: 85 fL (ref 79–97)
Monocytes Absolute: 0.7 10*3/uL (ref 0.1–0.9)
Monocytes: 6 %
Neutrophils Absolute: 8.5 10*3/uL — ABNORMAL HIGH (ref 1.4–7.0)
Neutrophils: 73 %
Platelets: 493 10*3/uL — ABNORMAL HIGH (ref 150–379)
RBC: 4.03 x10E6/uL (ref 3.77–5.28)
RDW: 14.6 % (ref 12.3–15.4)
WBC: 11.9 10*3/uL — ABNORMAL HIGH (ref 3.4–10.8)

## 2017-06-20 LAB — LIPID PANEL
Chol/HDL Ratio: 3 ratio (ref 0.0–4.4)
Cholesterol, Total: 170 mg/dL (ref 100–199)
HDL: 57 mg/dL (ref >39)
LDL Calculated: 91 mg/dL (ref 0–99)
Triglycerides: 109 mg/dL (ref 0–149)
VLDL Cholesterol Cal: 22 mg/dL (ref 5–40)

## 2017-06-20 NOTE — Telephone Encounter (Signed)
Pt aware of results 

## 2017-06-22 ENCOUNTER — Encounter: Payer: Self-pay | Admitting: Family Medicine

## 2017-06-23 DIAGNOSIS — Z992 Dependence on renal dialysis: Secondary | ICD-10-CM | POA: Diagnosis not present

## 2017-06-23 DIAGNOSIS — D631 Anemia in chronic kidney disease: Secondary | ICD-10-CM | POA: Diagnosis not present

## 2017-06-23 DIAGNOSIS — D509 Iron deficiency anemia, unspecified: Secondary | ICD-10-CM | POA: Diagnosis not present

## 2017-06-23 DIAGNOSIS — N2581 Secondary hyperparathyroidism of renal origin: Secondary | ICD-10-CM | POA: Diagnosis not present

## 2017-06-23 DIAGNOSIS — N186 End stage renal disease: Secondary | ICD-10-CM | POA: Diagnosis not present

## 2017-06-25 DIAGNOSIS — D631 Anemia in chronic kidney disease: Secondary | ICD-10-CM | POA: Diagnosis not present

## 2017-06-25 DIAGNOSIS — N186 End stage renal disease: Secondary | ICD-10-CM | POA: Diagnosis not present

## 2017-06-25 DIAGNOSIS — D509 Iron deficiency anemia, unspecified: Secondary | ICD-10-CM | POA: Diagnosis not present

## 2017-06-25 DIAGNOSIS — Z992 Dependence on renal dialysis: Secondary | ICD-10-CM | POA: Diagnosis not present

## 2017-06-25 DIAGNOSIS — N2581 Secondary hyperparathyroidism of renal origin: Secondary | ICD-10-CM | POA: Diagnosis not present

## 2017-06-26 DIAGNOSIS — Z992 Dependence on renal dialysis: Secondary | ICD-10-CM | POA: Diagnosis not present

## 2017-06-26 DIAGNOSIS — N186 End stage renal disease: Secondary | ICD-10-CM | POA: Diagnosis not present

## 2017-06-27 DIAGNOSIS — D509 Iron deficiency anemia, unspecified: Secondary | ICD-10-CM | POA: Diagnosis not present

## 2017-06-27 DIAGNOSIS — N2581 Secondary hyperparathyroidism of renal origin: Secondary | ICD-10-CM | POA: Diagnosis not present

## 2017-06-27 DIAGNOSIS — Z992 Dependence on renal dialysis: Secondary | ICD-10-CM | POA: Diagnosis not present

## 2017-06-27 DIAGNOSIS — D631 Anemia in chronic kidney disease: Secondary | ICD-10-CM | POA: Diagnosis not present

## 2017-06-27 DIAGNOSIS — N186 End stage renal disease: Secondary | ICD-10-CM | POA: Diagnosis not present

## 2017-06-30 DIAGNOSIS — N2581 Secondary hyperparathyroidism of renal origin: Secondary | ICD-10-CM | POA: Diagnosis not present

## 2017-06-30 DIAGNOSIS — N186 End stage renal disease: Secondary | ICD-10-CM | POA: Diagnosis not present

## 2017-06-30 DIAGNOSIS — Z992 Dependence on renal dialysis: Secondary | ICD-10-CM | POA: Diagnosis not present

## 2017-06-30 DIAGNOSIS — D631 Anemia in chronic kidney disease: Secondary | ICD-10-CM | POA: Diagnosis not present

## 2017-06-30 DIAGNOSIS — D509 Iron deficiency anemia, unspecified: Secondary | ICD-10-CM | POA: Diagnosis not present

## 2017-07-02 DIAGNOSIS — N2581 Secondary hyperparathyroidism of renal origin: Secondary | ICD-10-CM | POA: Diagnosis not present

## 2017-07-02 DIAGNOSIS — N186 End stage renal disease: Secondary | ICD-10-CM | POA: Diagnosis not present

## 2017-07-02 DIAGNOSIS — D509 Iron deficiency anemia, unspecified: Secondary | ICD-10-CM | POA: Diagnosis not present

## 2017-07-02 DIAGNOSIS — Z992 Dependence on renal dialysis: Secondary | ICD-10-CM | POA: Diagnosis not present

## 2017-07-02 DIAGNOSIS — D631 Anemia in chronic kidney disease: Secondary | ICD-10-CM | POA: Diagnosis not present

## 2017-07-03 DIAGNOSIS — E1151 Type 2 diabetes mellitus with diabetic peripheral angiopathy without gangrene: Secondary | ICD-10-CM | POA: Diagnosis not present

## 2017-07-03 DIAGNOSIS — M79671 Pain in right foot: Secondary | ICD-10-CM | POA: Diagnosis not present

## 2017-07-03 DIAGNOSIS — B351 Tinea unguium: Secondary | ICD-10-CM | POA: Diagnosis not present

## 2017-07-03 DIAGNOSIS — M79672 Pain in left foot: Secondary | ICD-10-CM | POA: Diagnosis not present

## 2017-07-04 DIAGNOSIS — Z992 Dependence on renal dialysis: Secondary | ICD-10-CM | POA: Diagnosis not present

## 2017-07-04 DIAGNOSIS — N186 End stage renal disease: Secondary | ICD-10-CM | POA: Diagnosis not present

## 2017-07-04 DIAGNOSIS — D509 Iron deficiency anemia, unspecified: Secondary | ICD-10-CM | POA: Diagnosis not present

## 2017-07-04 DIAGNOSIS — D631 Anemia in chronic kidney disease: Secondary | ICD-10-CM | POA: Diagnosis not present

## 2017-07-04 DIAGNOSIS — N2581 Secondary hyperparathyroidism of renal origin: Secondary | ICD-10-CM | POA: Diagnosis not present

## 2017-07-07 DIAGNOSIS — N186 End stage renal disease: Secondary | ICD-10-CM | POA: Diagnosis not present

## 2017-07-07 DIAGNOSIS — D631 Anemia in chronic kidney disease: Secondary | ICD-10-CM | POA: Diagnosis not present

## 2017-07-07 DIAGNOSIS — D509 Iron deficiency anemia, unspecified: Secondary | ICD-10-CM | POA: Diagnosis not present

## 2017-07-07 DIAGNOSIS — N2581 Secondary hyperparathyroidism of renal origin: Secondary | ICD-10-CM | POA: Diagnosis not present

## 2017-07-07 DIAGNOSIS — Z992 Dependence on renal dialysis: Secondary | ICD-10-CM | POA: Diagnosis not present

## 2017-07-09 DIAGNOSIS — D631 Anemia in chronic kidney disease: Secondary | ICD-10-CM | POA: Diagnosis not present

## 2017-07-09 DIAGNOSIS — N2581 Secondary hyperparathyroidism of renal origin: Secondary | ICD-10-CM | POA: Diagnosis not present

## 2017-07-09 DIAGNOSIS — N186 End stage renal disease: Secondary | ICD-10-CM | POA: Diagnosis not present

## 2017-07-09 DIAGNOSIS — D509 Iron deficiency anemia, unspecified: Secondary | ICD-10-CM | POA: Diagnosis not present

## 2017-07-09 DIAGNOSIS — Z992 Dependence on renal dialysis: Secondary | ICD-10-CM | POA: Diagnosis not present

## 2017-07-11 DIAGNOSIS — N186 End stage renal disease: Secondary | ICD-10-CM | POA: Diagnosis not present

## 2017-07-11 DIAGNOSIS — D509 Iron deficiency anemia, unspecified: Secondary | ICD-10-CM | POA: Diagnosis not present

## 2017-07-11 DIAGNOSIS — Z992 Dependence on renal dialysis: Secondary | ICD-10-CM | POA: Diagnosis not present

## 2017-07-11 DIAGNOSIS — D631 Anemia in chronic kidney disease: Secondary | ICD-10-CM | POA: Diagnosis not present

## 2017-07-11 DIAGNOSIS — N2581 Secondary hyperparathyroidism of renal origin: Secondary | ICD-10-CM | POA: Diagnosis not present

## 2017-07-14 DIAGNOSIS — D509 Iron deficiency anemia, unspecified: Secondary | ICD-10-CM | POA: Diagnosis not present

## 2017-07-14 DIAGNOSIS — N2581 Secondary hyperparathyroidism of renal origin: Secondary | ICD-10-CM | POA: Diagnosis not present

## 2017-07-14 DIAGNOSIS — D631 Anemia in chronic kidney disease: Secondary | ICD-10-CM | POA: Diagnosis not present

## 2017-07-14 DIAGNOSIS — N186 End stage renal disease: Secondary | ICD-10-CM | POA: Diagnosis not present

## 2017-07-14 DIAGNOSIS — Z992 Dependence on renal dialysis: Secondary | ICD-10-CM | POA: Diagnosis not present

## 2017-07-16 DIAGNOSIS — Z992 Dependence on renal dialysis: Secondary | ICD-10-CM | POA: Diagnosis not present

## 2017-07-16 DIAGNOSIS — N186 End stage renal disease: Secondary | ICD-10-CM | POA: Diagnosis not present

## 2017-07-16 DIAGNOSIS — D631 Anemia in chronic kidney disease: Secondary | ICD-10-CM | POA: Diagnosis not present

## 2017-07-16 DIAGNOSIS — D509 Iron deficiency anemia, unspecified: Secondary | ICD-10-CM | POA: Diagnosis not present

## 2017-07-16 DIAGNOSIS — N2581 Secondary hyperparathyroidism of renal origin: Secondary | ICD-10-CM | POA: Diagnosis not present

## 2017-07-18 DIAGNOSIS — N186 End stage renal disease: Secondary | ICD-10-CM | POA: Diagnosis not present

## 2017-07-18 DIAGNOSIS — D631 Anemia in chronic kidney disease: Secondary | ICD-10-CM | POA: Diagnosis not present

## 2017-07-18 DIAGNOSIS — N2581 Secondary hyperparathyroidism of renal origin: Secondary | ICD-10-CM | POA: Diagnosis not present

## 2017-07-18 DIAGNOSIS — D509 Iron deficiency anemia, unspecified: Secondary | ICD-10-CM | POA: Diagnosis not present

## 2017-07-18 DIAGNOSIS — Z992 Dependence on renal dialysis: Secondary | ICD-10-CM | POA: Diagnosis not present

## 2017-07-21 ENCOUNTER — Ambulatory Visit: Payer: Medicare Other | Admitting: Family Medicine

## 2017-07-21 DIAGNOSIS — D631 Anemia in chronic kidney disease: Secondary | ICD-10-CM | POA: Diagnosis not present

## 2017-07-21 DIAGNOSIS — E119 Type 2 diabetes mellitus without complications: Secondary | ICD-10-CM | POA: Diagnosis not present

## 2017-07-21 DIAGNOSIS — N186 End stage renal disease: Secondary | ICD-10-CM | POA: Diagnosis not present

## 2017-07-21 DIAGNOSIS — D509 Iron deficiency anemia, unspecified: Secondary | ICD-10-CM | POA: Diagnosis not present

## 2017-07-21 DIAGNOSIS — N2581 Secondary hyperparathyroidism of renal origin: Secondary | ICD-10-CM | POA: Diagnosis not present

## 2017-07-21 DIAGNOSIS — Z794 Long term (current) use of insulin: Secondary | ICD-10-CM | POA: Diagnosis not present

## 2017-07-21 DIAGNOSIS — Z992 Dependence on renal dialysis: Secondary | ICD-10-CM | POA: Diagnosis not present

## 2017-07-23 DIAGNOSIS — D631 Anemia in chronic kidney disease: Secondary | ICD-10-CM | POA: Diagnosis not present

## 2017-07-23 DIAGNOSIS — N2581 Secondary hyperparathyroidism of renal origin: Secondary | ICD-10-CM | POA: Diagnosis not present

## 2017-07-23 DIAGNOSIS — D509 Iron deficiency anemia, unspecified: Secondary | ICD-10-CM | POA: Diagnosis not present

## 2017-07-23 DIAGNOSIS — N186 End stage renal disease: Secondary | ICD-10-CM | POA: Diagnosis not present

## 2017-07-23 DIAGNOSIS — Z992 Dependence on renal dialysis: Secondary | ICD-10-CM | POA: Diagnosis not present

## 2017-07-25 DIAGNOSIS — D509 Iron deficiency anemia, unspecified: Secondary | ICD-10-CM | POA: Diagnosis not present

## 2017-07-25 DIAGNOSIS — Z992 Dependence on renal dialysis: Secondary | ICD-10-CM | POA: Diagnosis not present

## 2017-07-25 DIAGNOSIS — N2581 Secondary hyperparathyroidism of renal origin: Secondary | ICD-10-CM | POA: Diagnosis not present

## 2017-07-25 DIAGNOSIS — N186 End stage renal disease: Secondary | ICD-10-CM | POA: Diagnosis not present

## 2017-07-25 DIAGNOSIS — D631 Anemia in chronic kidney disease: Secondary | ICD-10-CM | POA: Diagnosis not present

## 2017-07-27 DIAGNOSIS — N186 End stage renal disease: Secondary | ICD-10-CM | POA: Diagnosis not present

## 2017-07-27 DIAGNOSIS — Z992 Dependence on renal dialysis: Secondary | ICD-10-CM | POA: Diagnosis not present

## 2017-07-28 DIAGNOSIS — D631 Anemia in chronic kidney disease: Secondary | ICD-10-CM | POA: Diagnosis not present

## 2017-07-28 DIAGNOSIS — N186 End stage renal disease: Secondary | ICD-10-CM | POA: Diagnosis not present

## 2017-07-28 DIAGNOSIS — Z992 Dependence on renal dialysis: Secondary | ICD-10-CM | POA: Diagnosis not present

## 2017-07-28 DIAGNOSIS — D509 Iron deficiency anemia, unspecified: Secondary | ICD-10-CM | POA: Diagnosis not present

## 2017-07-28 DIAGNOSIS — N2581 Secondary hyperparathyroidism of renal origin: Secondary | ICD-10-CM | POA: Diagnosis not present

## 2017-07-29 ENCOUNTER — Ambulatory Visit: Payer: Medicare Other | Admitting: Nutrition

## 2017-07-30 DIAGNOSIS — D631 Anemia in chronic kidney disease: Secondary | ICD-10-CM | POA: Diagnosis not present

## 2017-07-30 DIAGNOSIS — N186 End stage renal disease: Secondary | ICD-10-CM | POA: Diagnosis not present

## 2017-07-30 DIAGNOSIS — D509 Iron deficiency anemia, unspecified: Secondary | ICD-10-CM | POA: Diagnosis not present

## 2017-07-30 DIAGNOSIS — N2581 Secondary hyperparathyroidism of renal origin: Secondary | ICD-10-CM | POA: Diagnosis not present

## 2017-07-30 DIAGNOSIS — Z992 Dependence on renal dialysis: Secondary | ICD-10-CM | POA: Diagnosis not present

## 2017-08-01 DIAGNOSIS — D631 Anemia in chronic kidney disease: Secondary | ICD-10-CM | POA: Diagnosis not present

## 2017-08-01 DIAGNOSIS — Z992 Dependence on renal dialysis: Secondary | ICD-10-CM | POA: Diagnosis not present

## 2017-08-01 DIAGNOSIS — D509 Iron deficiency anemia, unspecified: Secondary | ICD-10-CM | POA: Diagnosis not present

## 2017-08-01 DIAGNOSIS — N186 End stage renal disease: Secondary | ICD-10-CM | POA: Diagnosis not present

## 2017-08-01 DIAGNOSIS — N2581 Secondary hyperparathyroidism of renal origin: Secondary | ICD-10-CM | POA: Diagnosis not present

## 2017-08-04 DIAGNOSIS — Z992 Dependence on renal dialysis: Secondary | ICD-10-CM | POA: Diagnosis not present

## 2017-08-04 DIAGNOSIS — N2581 Secondary hyperparathyroidism of renal origin: Secondary | ICD-10-CM | POA: Diagnosis not present

## 2017-08-04 DIAGNOSIS — N186 End stage renal disease: Secondary | ICD-10-CM | POA: Diagnosis not present

## 2017-08-04 DIAGNOSIS — D631 Anemia in chronic kidney disease: Secondary | ICD-10-CM | POA: Diagnosis not present

## 2017-08-04 DIAGNOSIS — D509 Iron deficiency anemia, unspecified: Secondary | ICD-10-CM | POA: Diagnosis not present

## 2017-08-06 DIAGNOSIS — Z992 Dependence on renal dialysis: Secondary | ICD-10-CM | POA: Diagnosis not present

## 2017-08-06 DIAGNOSIS — N186 End stage renal disease: Secondary | ICD-10-CM | POA: Diagnosis not present

## 2017-08-06 DIAGNOSIS — N2581 Secondary hyperparathyroidism of renal origin: Secondary | ICD-10-CM | POA: Diagnosis not present

## 2017-08-06 DIAGNOSIS — D631 Anemia in chronic kidney disease: Secondary | ICD-10-CM | POA: Diagnosis not present

## 2017-08-06 DIAGNOSIS — D509 Iron deficiency anemia, unspecified: Secondary | ICD-10-CM | POA: Diagnosis not present

## 2017-08-08 DIAGNOSIS — D509 Iron deficiency anemia, unspecified: Secondary | ICD-10-CM | POA: Diagnosis not present

## 2017-08-08 DIAGNOSIS — D631 Anemia in chronic kidney disease: Secondary | ICD-10-CM | POA: Diagnosis not present

## 2017-08-08 DIAGNOSIS — N186 End stage renal disease: Secondary | ICD-10-CM | POA: Diagnosis not present

## 2017-08-08 DIAGNOSIS — Z992 Dependence on renal dialysis: Secondary | ICD-10-CM | POA: Diagnosis not present

## 2017-08-08 DIAGNOSIS — N2581 Secondary hyperparathyroidism of renal origin: Secondary | ICD-10-CM | POA: Diagnosis not present

## 2017-08-11 DIAGNOSIS — D509 Iron deficiency anemia, unspecified: Secondary | ICD-10-CM | POA: Diagnosis not present

## 2017-08-11 DIAGNOSIS — N186 End stage renal disease: Secondary | ICD-10-CM | POA: Diagnosis not present

## 2017-08-11 DIAGNOSIS — D631 Anemia in chronic kidney disease: Secondary | ICD-10-CM | POA: Diagnosis not present

## 2017-08-11 DIAGNOSIS — Z992 Dependence on renal dialysis: Secondary | ICD-10-CM | POA: Diagnosis not present

## 2017-08-11 DIAGNOSIS — N2581 Secondary hyperparathyroidism of renal origin: Secondary | ICD-10-CM | POA: Diagnosis not present

## 2017-08-13 DIAGNOSIS — N186 End stage renal disease: Secondary | ICD-10-CM | POA: Diagnosis not present

## 2017-08-13 DIAGNOSIS — N2581 Secondary hyperparathyroidism of renal origin: Secondary | ICD-10-CM | POA: Diagnosis not present

## 2017-08-13 DIAGNOSIS — D631 Anemia in chronic kidney disease: Secondary | ICD-10-CM | POA: Diagnosis not present

## 2017-08-13 DIAGNOSIS — D509 Iron deficiency anemia, unspecified: Secondary | ICD-10-CM | POA: Diagnosis not present

## 2017-08-13 DIAGNOSIS — Z992 Dependence on renal dialysis: Secondary | ICD-10-CM | POA: Diagnosis not present

## 2017-08-15 DIAGNOSIS — N2581 Secondary hyperparathyroidism of renal origin: Secondary | ICD-10-CM | POA: Diagnosis not present

## 2017-08-15 DIAGNOSIS — N186 End stage renal disease: Secondary | ICD-10-CM | POA: Diagnosis not present

## 2017-08-15 DIAGNOSIS — Z992 Dependence on renal dialysis: Secondary | ICD-10-CM | POA: Diagnosis not present

## 2017-08-15 DIAGNOSIS — D509 Iron deficiency anemia, unspecified: Secondary | ICD-10-CM | POA: Diagnosis not present

## 2017-08-15 DIAGNOSIS — D631 Anemia in chronic kidney disease: Secondary | ICD-10-CM | POA: Diagnosis not present

## 2017-08-18 DIAGNOSIS — N2581 Secondary hyperparathyroidism of renal origin: Secondary | ICD-10-CM | POA: Diagnosis not present

## 2017-08-18 DIAGNOSIS — N186 End stage renal disease: Secondary | ICD-10-CM | POA: Diagnosis not present

## 2017-08-18 DIAGNOSIS — D509 Iron deficiency anemia, unspecified: Secondary | ICD-10-CM | POA: Diagnosis not present

## 2017-08-18 DIAGNOSIS — Z992 Dependence on renal dialysis: Secondary | ICD-10-CM | POA: Diagnosis not present

## 2017-08-18 DIAGNOSIS — D631 Anemia in chronic kidney disease: Secondary | ICD-10-CM | POA: Diagnosis not present

## 2017-08-20 DIAGNOSIS — D631 Anemia in chronic kidney disease: Secondary | ICD-10-CM | POA: Diagnosis not present

## 2017-08-20 DIAGNOSIS — N2581 Secondary hyperparathyroidism of renal origin: Secondary | ICD-10-CM | POA: Diagnosis not present

## 2017-08-20 DIAGNOSIS — N186 End stage renal disease: Secondary | ICD-10-CM | POA: Diagnosis not present

## 2017-08-20 DIAGNOSIS — D509 Iron deficiency anemia, unspecified: Secondary | ICD-10-CM | POA: Diagnosis not present

## 2017-08-20 DIAGNOSIS — Z992 Dependence on renal dialysis: Secondary | ICD-10-CM | POA: Diagnosis not present

## 2017-08-22 DIAGNOSIS — D509 Iron deficiency anemia, unspecified: Secondary | ICD-10-CM | POA: Diagnosis not present

## 2017-08-22 DIAGNOSIS — N186 End stage renal disease: Secondary | ICD-10-CM | POA: Diagnosis not present

## 2017-08-22 DIAGNOSIS — D631 Anemia in chronic kidney disease: Secondary | ICD-10-CM | POA: Diagnosis not present

## 2017-08-22 DIAGNOSIS — N2581 Secondary hyperparathyroidism of renal origin: Secondary | ICD-10-CM | POA: Diagnosis not present

## 2017-08-22 DIAGNOSIS — Z992 Dependence on renal dialysis: Secondary | ICD-10-CM | POA: Diagnosis not present

## 2017-08-25 DIAGNOSIS — Z992 Dependence on renal dialysis: Secondary | ICD-10-CM | POA: Diagnosis not present

## 2017-08-25 DIAGNOSIS — N2581 Secondary hyperparathyroidism of renal origin: Secondary | ICD-10-CM | POA: Diagnosis not present

## 2017-08-25 DIAGNOSIS — D509 Iron deficiency anemia, unspecified: Secondary | ICD-10-CM | POA: Diagnosis not present

## 2017-08-25 DIAGNOSIS — D631 Anemia in chronic kidney disease: Secondary | ICD-10-CM | POA: Diagnosis not present

## 2017-08-25 DIAGNOSIS — N186 End stage renal disease: Secondary | ICD-10-CM | POA: Diagnosis not present

## 2017-08-26 DIAGNOSIS — Z992 Dependence on renal dialysis: Secondary | ICD-10-CM | POA: Diagnosis not present

## 2017-08-26 DIAGNOSIS — N186 End stage renal disease: Secondary | ICD-10-CM | POA: Diagnosis not present

## 2017-08-27 DIAGNOSIS — D509 Iron deficiency anemia, unspecified: Secondary | ICD-10-CM | POA: Diagnosis not present

## 2017-08-27 DIAGNOSIS — N186 End stage renal disease: Secondary | ICD-10-CM | POA: Diagnosis not present

## 2017-08-27 DIAGNOSIS — N2581 Secondary hyperparathyroidism of renal origin: Secondary | ICD-10-CM | POA: Diagnosis not present

## 2017-08-27 DIAGNOSIS — Z992 Dependence on renal dialysis: Secondary | ICD-10-CM | POA: Diagnosis not present

## 2017-08-27 DIAGNOSIS — D631 Anemia in chronic kidney disease: Secondary | ICD-10-CM | POA: Diagnosis not present

## 2017-08-28 ENCOUNTER — Other Ambulatory Visit: Payer: Self-pay | Admitting: *Deleted

## 2017-08-28 MED ORDER — INSULIN GLARGINE 300 UNIT/ML ~~LOC~~ SOPN: 80.0000 [IU] | PEN_INJECTOR | Freq: Every day | SUBCUTANEOUS | 0 refills | Status: DC

## 2017-08-29 DIAGNOSIS — D509 Iron deficiency anemia, unspecified: Secondary | ICD-10-CM | POA: Diagnosis not present

## 2017-08-29 DIAGNOSIS — Z992 Dependence on renal dialysis: Secondary | ICD-10-CM | POA: Diagnosis not present

## 2017-08-29 DIAGNOSIS — D631 Anemia in chronic kidney disease: Secondary | ICD-10-CM | POA: Diagnosis not present

## 2017-08-29 DIAGNOSIS — N186 End stage renal disease: Secondary | ICD-10-CM | POA: Diagnosis not present

## 2017-08-29 DIAGNOSIS — N2581 Secondary hyperparathyroidism of renal origin: Secondary | ICD-10-CM | POA: Diagnosis not present

## 2017-09-01 DIAGNOSIS — D631 Anemia in chronic kidney disease: Secondary | ICD-10-CM | POA: Diagnosis not present

## 2017-09-01 DIAGNOSIS — Z992 Dependence on renal dialysis: Secondary | ICD-10-CM | POA: Diagnosis not present

## 2017-09-01 DIAGNOSIS — N186 End stage renal disease: Secondary | ICD-10-CM | POA: Diagnosis not present

## 2017-09-01 DIAGNOSIS — N2581 Secondary hyperparathyroidism of renal origin: Secondary | ICD-10-CM | POA: Diagnosis not present

## 2017-09-01 DIAGNOSIS — D509 Iron deficiency anemia, unspecified: Secondary | ICD-10-CM | POA: Diagnosis not present

## 2017-09-02 ENCOUNTER — Ambulatory Visit: Payer: Medicare Other | Admitting: Nutrition

## 2017-09-03 DIAGNOSIS — D631 Anemia in chronic kidney disease: Secondary | ICD-10-CM | POA: Diagnosis not present

## 2017-09-03 DIAGNOSIS — N2581 Secondary hyperparathyroidism of renal origin: Secondary | ICD-10-CM | POA: Diagnosis not present

## 2017-09-03 DIAGNOSIS — N186 End stage renal disease: Secondary | ICD-10-CM | POA: Diagnosis not present

## 2017-09-03 DIAGNOSIS — D509 Iron deficiency anemia, unspecified: Secondary | ICD-10-CM | POA: Diagnosis not present

## 2017-09-03 DIAGNOSIS — Z992 Dependence on renal dialysis: Secondary | ICD-10-CM | POA: Diagnosis not present

## 2017-09-05 DIAGNOSIS — N186 End stage renal disease: Secondary | ICD-10-CM | POA: Diagnosis not present

## 2017-09-05 DIAGNOSIS — D631 Anemia in chronic kidney disease: Secondary | ICD-10-CM | POA: Diagnosis not present

## 2017-09-05 DIAGNOSIS — Z992 Dependence on renal dialysis: Secondary | ICD-10-CM | POA: Diagnosis not present

## 2017-09-05 DIAGNOSIS — N2581 Secondary hyperparathyroidism of renal origin: Secondary | ICD-10-CM | POA: Diagnosis not present

## 2017-09-05 DIAGNOSIS — D509 Iron deficiency anemia, unspecified: Secondary | ICD-10-CM | POA: Diagnosis not present

## 2017-09-08 DIAGNOSIS — D509 Iron deficiency anemia, unspecified: Secondary | ICD-10-CM | POA: Diagnosis not present

## 2017-09-08 DIAGNOSIS — N2581 Secondary hyperparathyroidism of renal origin: Secondary | ICD-10-CM | POA: Diagnosis not present

## 2017-09-08 DIAGNOSIS — Z992 Dependence on renal dialysis: Secondary | ICD-10-CM | POA: Diagnosis not present

## 2017-09-08 DIAGNOSIS — N186 End stage renal disease: Secondary | ICD-10-CM | POA: Diagnosis not present

## 2017-09-08 DIAGNOSIS — D631 Anemia in chronic kidney disease: Secondary | ICD-10-CM | POA: Diagnosis not present

## 2017-09-10 DIAGNOSIS — D631 Anemia in chronic kidney disease: Secondary | ICD-10-CM | POA: Diagnosis not present

## 2017-09-10 DIAGNOSIS — D509 Iron deficiency anemia, unspecified: Secondary | ICD-10-CM | POA: Diagnosis not present

## 2017-09-10 DIAGNOSIS — N186 End stage renal disease: Secondary | ICD-10-CM | POA: Diagnosis not present

## 2017-09-10 DIAGNOSIS — Z992 Dependence on renal dialysis: Secondary | ICD-10-CM | POA: Diagnosis not present

## 2017-09-10 DIAGNOSIS — N2581 Secondary hyperparathyroidism of renal origin: Secondary | ICD-10-CM | POA: Diagnosis not present

## 2017-09-11 ENCOUNTER — Ambulatory Visit (INDEPENDENT_AMBULATORY_CARE_PROVIDER_SITE_OTHER): Payer: Medicare Other | Admitting: Physician Assistant

## 2017-09-11 ENCOUNTER — Encounter: Payer: Self-pay | Admitting: Physician Assistant

## 2017-09-11 VITALS — BP 175/78 | HR 80 | Temp 97.2°F | Ht 64.0 in | Wt 263.0 lb

## 2017-09-11 DIAGNOSIS — J399 Disease of upper respiratory tract, unspecified: Secondary | ICD-10-CM

## 2017-09-11 MED ORDER — AMOXICILLIN 875 MG PO TABS: 875.0000 mg | ORAL_TABLET | Freq: Two times a day (BID) | ORAL | 0 refills | Status: DC

## 2017-09-11 NOTE — Patient Instructions (Signed)
Upper Respiratory Infection, Adult Most upper respiratory infections (URIs) are caused by a virus. A URI affects the nose, throat, and upper air passages. The most common type of URI is often called "the common cold." Follow these instructions at home:  Take medicines only as told by your doctor.  Gargle warm saltwater or take cough drops to comfort your throat as told by your doctor.  Use a warm mist humidifier or inhale steam from a shower to increase air moisture. This may make it easier to breathe.  Drink enough fluid to keep your pee (urine) clear or pale yellow.  Eat soups and other clear broths.  Have a healthy diet.  Rest as needed.  Go back to work when your fever is gone or your doctor says it is okay. ? You may need to stay home longer to avoid giving your URI to others. ? You can also wear a face mask and wash your hands often to prevent spread of the virus.  Use your inhaler more if you have asthma.  Do not use any tobacco products, including cigarettes, chewing tobacco, or electronic cigarettes. If you need help quitting, ask your doctor. Contact a doctor if:  You are getting worse, not better.  Your symptoms are not helped by medicine.  You have chills.  You are getting more short of breath.  You have brown or red mucus.  You have yellow or brown discharge from your nose.  You have pain in your face, especially when you bend forward.  You have a fever.  You have puffy (swollen) neck glands.  You have pain while swallowing.  You have white areas in the back of your throat. Get help right away if:  You have very bad or constant: ? Headache. ? Ear pain. ? Pain in your forehead, behind your eyes, and over your cheekbones (sinus pain). ? Chest pain.  You have long-lasting (chronic) lung disease and any of the following: ? Wheezing. ? Long-lasting cough. ? Coughing up blood. ? A change in your usual mucus.  You have a stiff neck.  You have  changes in your: ? Vision. ? Hearing. ? Thinking. ? Mood. This information is not intended to replace advice given to you by your health care provider. Make sure you discuss any questions you have with your health care provider. Document Released: 10/02/2007 Document Revised: 12/17/2015 Document Reviewed: 07/21/2013 Elsevier Interactive Patient Education  2018 [employer]vier Inc.  

## 2017-09-11 NOTE — Progress Notes (Signed)
  Subjective:     Patient ID: Christy Zhang, female   DOB: 30-Sep-1955, 62 y.o.   MRN: [id number]  HPI Pt awoke 1-2 days ago with sinus pain and pressure and some L sided facial swelling The swelling improved but now with dry cough She has also noted some elevation of her BS readings   Review of Systems  Constitutional: Negative.   HENT: Positive for congestion, postnasal drip, rhinorrhea, sinus pressure and sinus pain. Negative for ear pain, nosebleeds, sore throat and voice change.   Eyes: Negative.   Respiratory: Positive for cough. Negative for shortness of breath.   Cardiovascular: Negative.        Objective:   Physical Exam  Constitutional: She appears well-developed and well-nourished. No distress.  HENT:  Right Ear: External ear normal.  Left Ear: External ear normal.  Mouth/Throat: Oropharynx is clear and moist. No oropharyngeal exudate.  Pt with multiple caries  Neck: Neck supple.  Cardiovascular: Normal rate, regular rhythm and normal heart sounds.  Pulmonary/Chest: Breath sounds normal.  Lymphadenopathy:    She has no cervical adenopathy.  Skin: She is not diaphoretic.  Nursing note and vitals reviewed.      Assessment:     1. Upper respiratory disease        Plan:     Rest Appt to f/u with Dr Christy Zhang for her chronic medical issues Her BP is elevated but states she has been instructed to take her BP med at night so it does not bottom out when she is at dialysis OTC meds as directed by specialist F/u with me prn

## 2017-09-12 DIAGNOSIS — D631 Anemia in chronic kidney disease: Secondary | ICD-10-CM | POA: Diagnosis not present

## 2017-09-12 DIAGNOSIS — N2581 Secondary hyperparathyroidism of renal origin: Secondary | ICD-10-CM | POA: Diagnosis not present

## 2017-09-12 DIAGNOSIS — D509 Iron deficiency anemia, unspecified: Secondary | ICD-10-CM | POA: Diagnosis not present

## 2017-09-12 DIAGNOSIS — N186 End stage renal disease: Secondary | ICD-10-CM | POA: Diagnosis not present

## 2017-09-12 DIAGNOSIS — Z992 Dependence on renal dialysis: Secondary | ICD-10-CM | POA: Diagnosis not present

## 2017-09-15 DIAGNOSIS — N2581 Secondary hyperparathyroidism of renal origin: Secondary | ICD-10-CM | POA: Diagnosis not present

## 2017-09-15 DIAGNOSIS — Z992 Dependence on renal dialysis: Secondary | ICD-10-CM | POA: Diagnosis not present

## 2017-09-15 DIAGNOSIS — N186 End stage renal disease: Secondary | ICD-10-CM | POA: Diagnosis not present

## 2017-09-15 DIAGNOSIS — D631 Anemia in chronic kidney disease: Secondary | ICD-10-CM | POA: Diagnosis not present

## 2017-09-15 DIAGNOSIS — D509 Iron deficiency anemia, unspecified: Secondary | ICD-10-CM | POA: Diagnosis not present

## 2017-09-17 DIAGNOSIS — N186 End stage renal disease: Secondary | ICD-10-CM | POA: Diagnosis not present

## 2017-09-17 DIAGNOSIS — D631 Anemia in chronic kidney disease: Secondary | ICD-10-CM | POA: Diagnosis not present

## 2017-09-17 DIAGNOSIS — D509 Iron deficiency anemia, unspecified: Secondary | ICD-10-CM | POA: Diagnosis not present

## 2017-09-17 DIAGNOSIS — N2581 Secondary hyperparathyroidism of renal origin: Secondary | ICD-10-CM | POA: Diagnosis not present

## 2017-09-17 DIAGNOSIS — Z992 Dependence on renal dialysis: Secondary | ICD-10-CM | POA: Diagnosis not present

## 2017-09-19 DIAGNOSIS — N2581 Secondary hyperparathyroidism of renal origin: Secondary | ICD-10-CM | POA: Diagnosis not present

## 2017-09-19 DIAGNOSIS — Z992 Dependence on renal dialysis: Secondary | ICD-10-CM | POA: Diagnosis not present

## 2017-09-19 DIAGNOSIS — D631 Anemia in chronic kidney disease: Secondary | ICD-10-CM | POA: Diagnosis not present

## 2017-09-19 DIAGNOSIS — D509 Iron deficiency anemia, unspecified: Secondary | ICD-10-CM | POA: Diagnosis not present

## 2017-09-19 DIAGNOSIS — N186 End stage renal disease: Secondary | ICD-10-CM | POA: Diagnosis not present

## 2017-09-22 DIAGNOSIS — N186 End stage renal disease: Secondary | ICD-10-CM | POA: Diagnosis not present

## 2017-09-22 DIAGNOSIS — Z992 Dependence on renal dialysis: Secondary | ICD-10-CM | POA: Diagnosis not present

## 2017-09-22 DIAGNOSIS — D509 Iron deficiency anemia, unspecified: Secondary | ICD-10-CM | POA: Diagnosis not present

## 2017-09-22 DIAGNOSIS — N2581 Secondary hyperparathyroidism of renal origin: Secondary | ICD-10-CM | POA: Diagnosis not present

## 2017-09-22 DIAGNOSIS — D631 Anemia in chronic kidney disease: Secondary | ICD-10-CM | POA: Diagnosis not present

## 2017-09-24 DIAGNOSIS — N2581 Secondary hyperparathyroidism of renal origin: Secondary | ICD-10-CM | POA: Diagnosis not present

## 2017-09-24 DIAGNOSIS — Z992 Dependence on renal dialysis: Secondary | ICD-10-CM | POA: Diagnosis not present

## 2017-09-24 DIAGNOSIS — D631 Anemia in chronic kidney disease: Secondary | ICD-10-CM | POA: Diagnosis not present

## 2017-09-24 DIAGNOSIS — N186 End stage renal disease: Secondary | ICD-10-CM | POA: Diagnosis not present

## 2017-09-24 DIAGNOSIS — D509 Iron deficiency anemia, unspecified: Secondary | ICD-10-CM | POA: Diagnosis not present

## 2017-09-26 DIAGNOSIS — D509 Iron deficiency anemia, unspecified: Secondary | ICD-10-CM | POA: Diagnosis not present

## 2017-09-26 DIAGNOSIS — N2581 Secondary hyperparathyroidism of renal origin: Secondary | ICD-10-CM | POA: Diagnosis not present

## 2017-09-26 DIAGNOSIS — N186 End stage renal disease: Secondary | ICD-10-CM | POA: Diagnosis not present

## 2017-09-26 DIAGNOSIS — Z992 Dependence on renal dialysis: Secondary | ICD-10-CM | POA: Diagnosis not present

## 2017-09-26 DIAGNOSIS — D631 Anemia in chronic kidney disease: Secondary | ICD-10-CM | POA: Diagnosis not present

## 2017-09-29 DIAGNOSIS — D509 Iron deficiency anemia, unspecified: Secondary | ICD-10-CM | POA: Diagnosis not present

## 2017-09-29 DIAGNOSIS — Z992 Dependence on renal dialysis: Secondary | ICD-10-CM | POA: Diagnosis not present

## 2017-09-29 DIAGNOSIS — N186 End stage renal disease: Secondary | ICD-10-CM | POA: Diagnosis not present

## 2017-09-29 DIAGNOSIS — D631 Anemia in chronic kidney disease: Secondary | ICD-10-CM | POA: Diagnosis not present

## 2017-09-29 DIAGNOSIS — N2581 Secondary hyperparathyroidism of renal origin: Secondary | ICD-10-CM | POA: Diagnosis not present

## 2017-10-01 DIAGNOSIS — Z992 Dependence on renal dialysis: Secondary | ICD-10-CM | POA: Diagnosis not present

## 2017-10-01 DIAGNOSIS — N186 End stage renal disease: Secondary | ICD-10-CM | POA: Diagnosis not present

## 2017-10-01 DIAGNOSIS — D631 Anemia in chronic kidney disease: Secondary | ICD-10-CM | POA: Diagnosis not present

## 2017-10-01 DIAGNOSIS — N2581 Secondary hyperparathyroidism of renal origin: Secondary | ICD-10-CM | POA: Diagnosis not present

## 2017-10-01 DIAGNOSIS — D509 Iron deficiency anemia, unspecified: Secondary | ICD-10-CM | POA: Diagnosis not present

## 2017-10-03 DIAGNOSIS — D631 Anemia in chronic kidney disease: Secondary | ICD-10-CM | POA: Diagnosis not present

## 2017-10-03 DIAGNOSIS — N186 End stage renal disease: Secondary | ICD-10-CM | POA: Diagnosis not present

## 2017-10-03 DIAGNOSIS — Z992 Dependence on renal dialysis: Secondary | ICD-10-CM | POA: Diagnosis not present

## 2017-10-03 DIAGNOSIS — D509 Iron deficiency anemia, unspecified: Secondary | ICD-10-CM | POA: Diagnosis not present

## 2017-10-03 DIAGNOSIS — N2581 Secondary hyperparathyroidism of renal origin: Secondary | ICD-10-CM | POA: Diagnosis not present

## 2017-10-06 DIAGNOSIS — D631 Anemia in chronic kidney disease: Secondary | ICD-10-CM | POA: Diagnosis not present

## 2017-10-06 DIAGNOSIS — Z992 Dependence on renal dialysis: Secondary | ICD-10-CM | POA: Diagnosis not present

## 2017-10-06 DIAGNOSIS — N186 End stage renal disease: Secondary | ICD-10-CM | POA: Diagnosis not present

## 2017-10-06 DIAGNOSIS — D509 Iron deficiency anemia, unspecified: Secondary | ICD-10-CM | POA: Diagnosis not present

## 2017-10-06 DIAGNOSIS — N2581 Secondary hyperparathyroidism of renal origin: Secondary | ICD-10-CM | POA: Diagnosis not present

## 2017-10-08 DIAGNOSIS — Z992 Dependence on renal dialysis: Secondary | ICD-10-CM | POA: Diagnosis not present

## 2017-10-08 DIAGNOSIS — N186 End stage renal disease: Secondary | ICD-10-CM | POA: Diagnosis not present

## 2017-10-08 DIAGNOSIS — N2581 Secondary hyperparathyroidism of renal origin: Secondary | ICD-10-CM | POA: Diagnosis not present

## 2017-10-08 DIAGNOSIS — D509 Iron deficiency anemia, unspecified: Secondary | ICD-10-CM | POA: Diagnosis not present

## 2017-10-08 DIAGNOSIS — D631 Anemia in chronic kidney disease: Secondary | ICD-10-CM | POA: Diagnosis not present

## 2017-10-10 DIAGNOSIS — N186 End stage renal disease: Secondary | ICD-10-CM | POA: Diagnosis not present

## 2017-10-10 DIAGNOSIS — D631 Anemia in chronic kidney disease: Secondary | ICD-10-CM | POA: Diagnosis not present

## 2017-10-10 DIAGNOSIS — Z992 Dependence on renal dialysis: Secondary | ICD-10-CM | POA: Diagnosis not present

## 2017-10-10 DIAGNOSIS — N2581 Secondary hyperparathyroidism of renal origin: Secondary | ICD-10-CM | POA: Diagnosis not present

## 2017-10-10 DIAGNOSIS — D509 Iron deficiency anemia, unspecified: Secondary | ICD-10-CM | POA: Diagnosis not present

## 2017-10-13 DIAGNOSIS — N186 End stage renal disease: Secondary | ICD-10-CM | POA: Diagnosis not present

## 2017-10-13 DIAGNOSIS — D509 Iron deficiency anemia, unspecified: Secondary | ICD-10-CM | POA: Diagnosis not present

## 2017-10-13 DIAGNOSIS — D631 Anemia in chronic kidney disease: Secondary | ICD-10-CM | POA: Diagnosis not present

## 2017-10-13 DIAGNOSIS — N2581 Secondary hyperparathyroidism of renal origin: Secondary | ICD-10-CM | POA: Diagnosis not present

## 2017-10-13 DIAGNOSIS — Z992 Dependence on renal dialysis: Secondary | ICD-10-CM | POA: Diagnosis not present

## 2017-10-14 ENCOUNTER — Encounter: Payer: Self-pay | Admitting: Family Medicine

## 2017-10-14 ENCOUNTER — Ambulatory Visit (INDEPENDENT_AMBULATORY_CARE_PROVIDER_SITE_OTHER): Payer: Medicare Other | Admitting: Family Medicine

## 2017-10-14 VITALS — BP 165/81 | HR 95 | Temp 97.5°F | Ht 64.0 in | Wt 262.4 lb

## 2017-10-14 DIAGNOSIS — N185 Chronic kidney disease, stage 5: Secondary | ICD-10-CM | POA: Diagnosis not present

## 2017-10-14 DIAGNOSIS — E1165 Type 2 diabetes mellitus with hyperglycemia: Secondary | ICD-10-CM | POA: Diagnosis not present

## 2017-10-14 DIAGNOSIS — E1122 Type 2 diabetes mellitus with diabetic chronic kidney disease: Secondary | ICD-10-CM | POA: Diagnosis not present

## 2017-10-14 DIAGNOSIS — IMO0002 Reserved for concepts with insufficient information to code with codable children: Secondary | ICD-10-CM

## 2017-10-14 LAB — BAYER DCA HB A1C WAIVED: HB A1C (BAYER DCA - WAIVED): 8.5 % — ABNORMAL HIGH (ref <7.0)

## 2017-10-14 MED ORDER — DULAGLUTIDE 0.75 MG/0.5ML ~~LOC~~ SOAJ: 0.5000 mL | SUBCUTANEOUS | 2 refills | Status: DC

## 2017-10-14 MED ORDER — METOPROLOL SUCCINATE ER 100 MG PO TB24: 100.0000 mg | ORAL_TABLET | Freq: Every day | ORAL | 1 refills | Status: DC

## 2017-10-14 MED ORDER — INSULIN GLARGINE 300 UNIT/ML ~~LOC~~ SOPN: 80.0000 [IU] | PEN_INJECTOR | Freq: Every day | SUBCUTANEOUS | 2 refills | Status: DC

## 2017-10-14 MED ORDER — AMLODIPINE BESYLATE 10 MG PO TABS: 10.0000 mg | ORAL_TABLET | Freq: Every day | ORAL | 1 refills | Status: DC

## 2017-10-14 NOTE — Progress Notes (Signed)
Subjective:  Patient ID: Christy Zhang,  female    DOB: [birth year]  Age: 62 y.o.    CC: Medical Management of Chronic Issues   HPI Christy Zhang presents for  follow-up of hypertension. Patient has no history of headache chest pain or shortness of breath . Patient also denies symptoms of TIA such as numbness weakness lateralizing. Patient denies side effects from medication. States taking regularly.  Patient receives dialysis Monday Wednesday Friday.  Although blood pressure is elevated, medications are managed through her dialysis.  Her dialysis nurse yesterday told her that it was a bit high.  She says she is drinking sodas and the salt may be affecting her blood pressure.  Patient also  in for follow-up of elevated cholesterol. Doing well without complaints on atorvastatin. Denies side effects  including myalgia and arthralgia and nausea. Also in today for liver function testing. Currently no chest pain, shortness of breath or other cardiovascular related symptoms noted.  Follow-up of diabetes. Patient does check blood sugar at home. Readings run between 160 and 220.  Admits to drinking soda pop and eating bread as well as other carbs without limitation. Patient denies symptoms such as excessive hunger or urinary frequency, excessive hunger, nausea No significant hypoglycemic spells noted. Medications reviewed. Pt reports taking them regularly.  Confirms dose of Toujeo at 80 units daily.  Trulicity weekly.  Pt. denies complication/adverse reaction today.    History Christy Zhang has a past medical history of Anemia, Arthritis, Asthma, CHF (congestive heart failure) ([place]), Chronic kidney disease, Diabetes mellitus with complication ([place]), End stage renal disease ([place]), End stage renal disease on dialysis [facility]), Hypertension, and Neuromuscular disorder ([place]).   She has a past surgical history that includes Insertion of dialysis catheter; Tubal ligation; AV fistula placement (Right,  08/25/2014); Fistula superficialization (Right, 11/03/2014); Ligation of competing branches of arteriovenous fistula (Right, 11/03/2014); Fistulogram (Right, 01/16/2016); A/V Fistulagram (Right, 02/04/2017); and PERIPHERAL VASCULAR BALLOON ANGIOPLASTY (Right, 02/04/2017).   Her family history includes Cancer in her father; Diabetes in her daughter, father, mother, and son; Heart disease in her daughter and son; Hypertension in her daughter, mother, sister, and son; Peripheral vascular disease in her son.She reports that she quit smoking about 33 years ago. Her smoking use included cigarettes. She has never used smokeless tobacco. She reports that she does not drink alcohol or use drugs.  Current Outpatient Medications on File Prior to Visit  Medication Sig Dispense Refill  . acetaminophen (TYLENOL) 500 MG tablet Take 500 mg by mouth every 8 (eight) hours as needed for mild pain.    Marland Kitchen atorvastatin (LIPITOR) 40 MG tablet Take 1 tablet (40 mg total) by mouth daily. 90 tablet 3  . Blood Glucose Monitoring Suppl (BLOOD GLUCOSE MONITOR SYSTEM) w/Device KIT Use to check Blood Sugar up to 4 x daily as directed. Dispense based on pt insurance. E11.9 1 each 0  . diphenhydrAMINE (BENADRYL) 25 mg capsule Take 25 mg by mouth every 8 (eight) hours as needed for itching.    Marland Kitchen glucose blood test strip Use to test blood sugars QID 100 each 12  . Lancets Misc. KIT 1 each by Does not apply route 4 (four) times daily as needed. 120 each 11  . sevelamer carbonate (RENVELA) 800 MG tablet Take 800 mg by mouth 3 (three) times daily with meals. Takes 3 pills with meals, takes 2 pills with snacks     No current facility-administered medications on file prior to visit.     ROS Review of  Systems  Constitutional: Negative.   HENT: Negative for congestion.   Eyes: Negative for visual disturbance.  Respiratory: Negative for shortness of breath.   Cardiovascular: Negative for chest pain.  Gastrointestinal: Negative for abdominal  pain, constipation, diarrhea, nausea and vomiting.  Genitourinary: Negative for difficulty urinating.  Musculoskeletal: Negative for arthralgias and myalgias.  Neurological: Negative for headaches.  Psychiatric/Behavioral: Negative for sleep disturbance.    Objective:  BP (!) 165/81   Pulse 95   Temp (!) 97.5 F (36.4 C) (Oral)   Ht 5' 4" (1.626 m)   Wt 262 lb 6 oz (119 kg)   BMI 45.04 kg/m   BP Readings from Last 3 Encounters:  10/14/17 (!) 165/81  09/11/17 (!) 175/78  06/19/17 (!) 167/67    Wt Readings from Last 3 Encounters:  10/14/17 262 lb 6 oz (119 kg)  09/11/17 263 lb (119.3 kg)  06/19/17 264 lb (119.7 kg)     Physical Exam  Constitutional: She is oriented to person, place, and time. She appears well-developed and well-nourished. No distress.  HENT:  Head: Normocephalic and atraumatic.  Right Ear: External ear normal.  Left Ear: External ear normal.  Nose: Nose normal.  Mouth/Throat: Oropharynx is clear and moist.  Eyes: Pupils are equal, round, and reactive to light. Conjunctivae and EOM are normal.  Neck: Normal range of motion. Neck supple. No thyromegaly present.  Cardiovascular: Normal rate, regular rhythm and normal heart sounds.  No murmur heard. Pulmonary/Chest: Effort normal and breath sounds normal. No respiratory distress. She has no wheezes. She has no rales.  Abdominal: Soft. Bowel sounds are normal. She exhibits no distension. There is no tenderness.  Lymphadenopathy:    She has no cervical adenopathy.  Neurological: She is alert and oriented to person, place, and time. She has normal reflexes.  Skin: Skin is warm and dry.  Psychiatric: She has a normal mood and affect. Her behavior is normal. Judgment and thought content normal.    Diabetic Foot Exam - Simple   Simple Foot Form Diabetic Foot exam was performed with the following findings:  Yes 10/14/2017 10:20 AM  Visual Inspection No deformities, no ulcerations, no other skin breakdown  bilaterally:  Yes Sensation Testing Intact to touch and monofilament testing bilaterally:  Yes Pulse Check Posterior Tibialis and Dorsalis pulse intact bilaterally:  Yes Comments       Assessment & Plan:   Christy Zhang was seen today for medical management of chronic issues.  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 -     Dulaglutide (TRULICITY) 9.39 QZ/0.0PQ SOPN; Inject 0.5 mLs into the skin once a week. -     Insulin Glargine (TOUJEO SOLOSTAR) 300 UNIT/ML SOPN; Inject 80 Units into the skin daily. -     metoprolol succinate (TOPROL-XL) 100 MG 24 hr tablet; Take 1 tablet (100 mg total) by mouth daily. Take with or immediately following a meal.   I have discontinued Christy Zhang's amoxicillin. I am also having her maintain her diphenhydrAMINE, acetaminophen, sevelamer carbonate, atorvastatin, Blood Glucose Monitor System, Lancets Misc., glucose blood, amLODipine, Dulaglutide, Insulin Glargine, and metoprolol succinate.  Meds ordered this encounter  Medications  . amLODipine (NORVASC) 10  MG tablet    Sig: Take 1 tablet (10 mg total) by mouth daily.    Dispense:  90 tablet    Refill:  1  . Dulaglutide (TRULICITY) 8.25 KN/3.9JQ SOPN    Sig: Inject 0.5 mLs into the skin once a week.    Dispense:  4 pen    Refill:  2  . Insulin Glargine (TOUJEO SOLOSTAR) 300 UNIT/ML SOPN    Sig: Inject 80 Units into the skin daily.    Dispense:  9 mL    Refill:  2  . metoprolol succinate (TOPROL-XL) 100 MG 24 hr tablet    Sig: Take 1 tablet (100 mg total) by mouth daily. Take with or immediately following a meal.    Dispense:  90 tablet    Refill:  1   I will defer treatment of blood pressure to her dialysis team.  However she should report today's  elevated readings to them with request for their opinion when she goes back for tomorrow's treatment.  Follow-up: Return in about 3 months (around 01/14/2018).  Claretta Fraise, M.D.

## 2017-10-14 NOTE — Patient Instructions (Signed)
Call Eye Dr Today     Carbohydrate Counting for Diabetes Mellitus, Adult Carbohydrate counting is a method for keeping track of how many carbohydrates you eat. Eating carbohydrates naturally increases the amount of sugar (glucose) in the blood. Counting how many carbohydrates you eat helps keep your blood glucose within normal limits, which helps you manage your diabetes (diabetes mellitus). It is important to know how many carbohydrates you can safely have in each meal. This is different for every person. A diet and nutrition specialist (registered dietitian) can help you make a meal plan and calculate how many carbohydrates you should have at each meal and snack. Carbohydrates are found in the following foods:  Grains, such as breads and cereals.  Dried beans and soy products.  Starchy vegetables, such as potatoes, peas, and corn.  Fruit and fruit juices.  Milk and yogurt.  Sweets and snack foods, such as cake, cookies, candy, chips, and soft drinks.  How do I count carbohydrates? There are two ways to count carbohydrates in food. You can use either of the methods or a combination of both. Reading "Nutrition Facts" on packaged food The "Nutrition Facts" list is included on the labels of almost all packaged foods and beverages in the [place] It includes:  The serving size.  Information about nutrients in each serving, including the grams (g) of carbohydrate per serving.  To use the "Nutrition Facts":  Decide how many servings you will have.  Multiply the number of servings by the number of carbohydrates per serving.  The resulting number is the total amount of carbohydrates that you will be having.  Learning standard serving sizes of other foods When you eat foods containing carbohydrates that are not packaged or do not include "Nutrition Facts" on the label, you need to measure the servings in order to count the amount of carbohydrates:  Measure the foods that you will eat  with a food scale or measuring cup, if needed.  Decide how many standard-size servings you will eat.  Multiply the number of servings by 15. Most carbohydrate-rich foods have about 15 g of carbohydrates per serving. ? For example, if you eat 8 oz (170 g) of strawberries, you will have eaten 2 servings and 30 g of carbohydrates (2 servings x 15 g = 30 g).  For foods that have more than one food mixed, such as soups and casseroles, you must count the carbohydrates in each food that is included.  The following list contains standard serving sizes of common carbohydrate-rich foods. Each of these servings has about 15 g of carbohydrates:   hamburger bun or  English muffin.   oz (15 mL) syrup.   oz (14 g) jelly.  1 slice of bread.  1 six-inch tortilla.  3 oz (85 g) cooked rice or pasta.  4 oz (113 g) cooked dried beans.  4 oz (113 g) starchy vegetable, such as peas, corn, or potatoes.  4 oz (113 g) hot cereal.  4 oz (113 g) mashed potatoes or  of a large baked potato.  4 oz (113 g) canned or frozen fruit.  4 oz (120 mL) fruit juice.  4-6 crackers.  6 chicken nuggets.  6 oz (170 g) unsweetened dry cereal.  6 oz (170 g) plain fat-free yogurt or yogurt sweetened with artificial sweeteners.  8 oz (240 mL) milk.  8 oz (170 g) fresh fruit or one small piece of fruit.  24 oz (680 g) popped popcorn.  Example of carbohydrate counting Sample  meal  3 oz (85 g) chicken breast.  6 oz (170 g) brown rice.  4 oz (113 g) corn.  8 oz (240 mL) milk.  8 oz (170 g) strawberries with sugar-free whipped topping. Carbohydrate calculation 1. Identify the foods that contain carbohydrates: ? Rice. ? Corn. ? Milk. ? Strawberries. 2. Calculate how many servings you have of each food: ? 2 servings rice. ? 1 serving corn. ? 1 serving milk. ? 1 serving strawberries. 3. Multiply each number of servings by 15 g: ? 2 servings rice x 15 g = 30 g. ? 1 serving corn x 15 g = 15  g. ? 1 serving milk x 15 g = 15 g. ? 1 serving strawberries x 15 g = 15 g. 4. Add together all of the amounts to find the total grams of carbohydrates eaten: ? 30 g + 15 g + 15 g + 15 g = 75 g of carbohydrates total. This information is not intended to replace advice given to you by your health care provider. Make sure you discuss any questions you have with your health care provider. Document Released: 04/15/2005 Document Revised: 11/03/2015 Document Reviewed: 09/27/2015 Elsevier Interactive Patient Education  [employer].

## 2017-10-15 DIAGNOSIS — D631 Anemia in chronic kidney disease: Secondary | ICD-10-CM | POA: Diagnosis not present

## 2017-10-15 DIAGNOSIS — Z992 Dependence on renal dialysis: Secondary | ICD-10-CM | POA: Diagnosis not present

## 2017-10-15 DIAGNOSIS — N2581 Secondary hyperparathyroidism of renal origin: Secondary | ICD-10-CM | POA: Diagnosis not present

## 2017-10-15 DIAGNOSIS — N186 End stage renal disease: Secondary | ICD-10-CM | POA: Diagnosis not present

## 2017-10-15 DIAGNOSIS — D509 Iron deficiency anemia, unspecified: Secondary | ICD-10-CM | POA: Diagnosis not present

## 2017-10-15 LAB — CMP14+EGFR
ALT: 6 IU/L (ref 0–32)
AST: 9 IU/L (ref 0–40)
Albumin/Globulin Ratio: 1 — ABNORMAL LOW (ref 1.2–2.2)
Albumin: 4.4 g/dL (ref 3.6–4.8)
Alkaline Phosphatase: 69 IU/L (ref 39–117)
BUN/Creatinine Ratio: 4 — ABNORMAL LOW (ref 12–28)
BUN: 18 mg/dL (ref 8–27)
Bilirubin Total: 0.5 mg/dL (ref 0.0–1.2)
CO2: 24 mmol/L (ref 20–29)
Calcium: 10.3 mg/dL (ref 8.7–10.3)
Chloride: 97 mmol/L (ref 96–106)
Creatinine, Ser: 5.05 mg/dL (ref 0.57–1.00)
GFR calc Af Amer: 10 mL/min/{1.73_m2} — ABNORMAL LOW (ref >59)
GFR calc non Af Amer: 9 mL/min/{1.73_m2} — ABNORMAL LOW (ref >59)
Globulin, Total: 4.2 g/dL (ref 1.5–4.5)
Glucose: 108 mg/dL — ABNORMAL HIGH (ref 65–99)
Potassium: 4.3 mmol/L (ref 3.5–5.2)
Sodium: 139 mmol/L (ref 134–144)
Total Protein: 8.6 g/dL — ABNORMAL HIGH (ref 6.0–8.5)

## 2017-10-15 LAB — C-PEPTIDE: C-Peptide: 2.5 ng/mL (ref 1.1–4.4)

## 2017-10-17 DIAGNOSIS — D631 Anemia in chronic kidney disease: Secondary | ICD-10-CM | POA: Diagnosis not present

## 2017-10-17 DIAGNOSIS — N2581 Secondary hyperparathyroidism of renal origin: Secondary | ICD-10-CM | POA: Diagnosis not present

## 2017-10-17 DIAGNOSIS — Z992 Dependence on renal dialysis: Secondary | ICD-10-CM | POA: Diagnosis not present

## 2017-10-17 DIAGNOSIS — N186 End stage renal disease: Secondary | ICD-10-CM | POA: Diagnosis not present

## 2017-10-17 DIAGNOSIS — D509 Iron deficiency anemia, unspecified: Secondary | ICD-10-CM | POA: Diagnosis not present

## 2017-10-20 DIAGNOSIS — E119 Type 2 diabetes mellitus without complications: Secondary | ICD-10-CM | POA: Diagnosis not present

## 2017-10-20 DIAGNOSIS — N2581 Secondary hyperparathyroidism of renal origin: Secondary | ICD-10-CM | POA: Diagnosis not present

## 2017-10-20 DIAGNOSIS — Z992 Dependence on renal dialysis: Secondary | ICD-10-CM | POA: Diagnosis not present

## 2017-10-20 DIAGNOSIS — N186 End stage renal disease: Secondary | ICD-10-CM | POA: Diagnosis not present

## 2017-10-20 DIAGNOSIS — Z794 Long term (current) use of insulin: Secondary | ICD-10-CM | POA: Diagnosis not present

## 2017-10-20 DIAGNOSIS — D631 Anemia in chronic kidney disease: Secondary | ICD-10-CM | POA: Diagnosis not present

## 2017-10-20 DIAGNOSIS — D509 Iron deficiency anemia, unspecified: Secondary | ICD-10-CM | POA: Diagnosis not present

## 2017-10-22 DIAGNOSIS — N186 End stage renal disease: Secondary | ICD-10-CM | POA: Diagnosis not present

## 2017-10-22 DIAGNOSIS — N2581 Secondary hyperparathyroidism of renal origin: Secondary | ICD-10-CM | POA: Diagnosis not present

## 2017-10-22 DIAGNOSIS — D631 Anemia in chronic kidney disease: Secondary | ICD-10-CM | POA: Diagnosis not present

## 2017-10-22 DIAGNOSIS — Z992 Dependence on renal dialysis: Secondary | ICD-10-CM | POA: Diagnosis not present

## 2017-10-22 DIAGNOSIS — D509 Iron deficiency anemia, unspecified: Secondary | ICD-10-CM | POA: Diagnosis not present

## 2017-10-23 ENCOUNTER — Ambulatory Visit: Payer: Medicare Other | Admitting: Nutrition

## 2017-10-24 DIAGNOSIS — N2581 Secondary hyperparathyroidism of renal origin: Secondary | ICD-10-CM | POA: Diagnosis not present

## 2017-10-24 DIAGNOSIS — D509 Iron deficiency anemia, unspecified: Secondary | ICD-10-CM | POA: Diagnosis not present

## 2017-10-24 DIAGNOSIS — N186 End stage renal disease: Secondary | ICD-10-CM | POA: Diagnosis not present

## 2017-10-24 DIAGNOSIS — Z992 Dependence on renal dialysis: Secondary | ICD-10-CM | POA: Diagnosis not present

## 2017-10-24 DIAGNOSIS — D631 Anemia in chronic kidney disease: Secondary | ICD-10-CM | POA: Diagnosis not present

## 2017-10-26 DIAGNOSIS — N186 End stage renal disease: Secondary | ICD-10-CM | POA: Diagnosis not present

## 2017-10-26 DIAGNOSIS — Z992 Dependence on renal dialysis: Secondary | ICD-10-CM | POA: Diagnosis not present

## 2017-10-27 DIAGNOSIS — N2581 Secondary hyperparathyroidism of renal origin: Secondary | ICD-10-CM | POA: Diagnosis not present

## 2017-10-27 DIAGNOSIS — D509 Iron deficiency anemia, unspecified: Secondary | ICD-10-CM | POA: Diagnosis not present

## 2017-10-27 DIAGNOSIS — Z992 Dependence on renal dialysis: Secondary | ICD-10-CM | POA: Diagnosis not present

## 2017-10-27 DIAGNOSIS — D631 Anemia in chronic kidney disease: Secondary | ICD-10-CM | POA: Diagnosis not present

## 2017-10-27 DIAGNOSIS — N186 End stage renal disease: Secondary | ICD-10-CM | POA: Diagnosis not present

## 2017-10-29 DIAGNOSIS — N186 End stage renal disease: Secondary | ICD-10-CM | POA: Diagnosis not present

## 2017-10-29 DIAGNOSIS — D631 Anemia in chronic kidney disease: Secondary | ICD-10-CM | POA: Diagnosis not present

## 2017-10-29 DIAGNOSIS — D509 Iron deficiency anemia, unspecified: Secondary | ICD-10-CM | POA: Diagnosis not present

## 2017-10-29 DIAGNOSIS — Z992 Dependence on renal dialysis: Secondary | ICD-10-CM | POA: Diagnosis not present

## 2017-10-29 DIAGNOSIS — N2581 Secondary hyperparathyroidism of renal origin: Secondary | ICD-10-CM | POA: Diagnosis not present

## 2017-10-31 DIAGNOSIS — N186 End stage renal disease: Secondary | ICD-10-CM | POA: Diagnosis not present

## 2017-10-31 DIAGNOSIS — D631 Anemia in chronic kidney disease: Secondary | ICD-10-CM | POA: Diagnosis not present

## 2017-10-31 DIAGNOSIS — Z992 Dependence on renal dialysis: Secondary | ICD-10-CM | POA: Diagnosis not present

## 2017-10-31 DIAGNOSIS — N2581 Secondary hyperparathyroidism of renal origin: Secondary | ICD-10-CM | POA: Diagnosis not present

## 2017-10-31 DIAGNOSIS — D509 Iron deficiency anemia, unspecified: Secondary | ICD-10-CM | POA: Diagnosis not present

## 2017-11-03 DIAGNOSIS — N186 End stage renal disease: Secondary | ICD-10-CM | POA: Diagnosis not present

## 2017-11-03 DIAGNOSIS — D509 Iron deficiency anemia, unspecified: Secondary | ICD-10-CM | POA: Diagnosis not present

## 2017-11-03 DIAGNOSIS — N2581 Secondary hyperparathyroidism of renal origin: Secondary | ICD-10-CM | POA: Diagnosis not present

## 2017-11-03 DIAGNOSIS — D631 Anemia in chronic kidney disease: Secondary | ICD-10-CM | POA: Diagnosis not present

## 2017-11-03 DIAGNOSIS — Z992 Dependence on renal dialysis: Secondary | ICD-10-CM | POA: Diagnosis not present

## 2017-11-05 DIAGNOSIS — D631 Anemia in chronic kidney disease: Secondary | ICD-10-CM | POA: Diagnosis not present

## 2017-11-05 DIAGNOSIS — N2581 Secondary hyperparathyroidism of renal origin: Secondary | ICD-10-CM | POA: Diagnosis not present

## 2017-11-05 DIAGNOSIS — Z992 Dependence on renal dialysis: Secondary | ICD-10-CM | POA: Diagnosis not present

## 2017-11-05 DIAGNOSIS — D509 Iron deficiency anemia, unspecified: Secondary | ICD-10-CM | POA: Diagnosis not present

## 2017-11-05 DIAGNOSIS — N186 End stage renal disease: Secondary | ICD-10-CM | POA: Diagnosis not present

## 2017-11-07 DIAGNOSIS — D631 Anemia in chronic kidney disease: Secondary | ICD-10-CM | POA: Diagnosis not present

## 2017-11-07 DIAGNOSIS — N2581 Secondary hyperparathyroidism of renal origin: Secondary | ICD-10-CM | POA: Diagnosis not present

## 2017-11-07 DIAGNOSIS — D509 Iron deficiency anemia, unspecified: Secondary | ICD-10-CM | POA: Diagnosis not present

## 2017-11-07 DIAGNOSIS — N186 End stage renal disease: Secondary | ICD-10-CM | POA: Diagnosis not present

## 2017-11-07 DIAGNOSIS — Z992 Dependence on renal dialysis: Secondary | ICD-10-CM | POA: Diagnosis not present

## 2017-11-10 DIAGNOSIS — Z992 Dependence on renal dialysis: Secondary | ICD-10-CM | POA: Diagnosis not present

## 2017-11-10 DIAGNOSIS — N186 End stage renal disease: Secondary | ICD-10-CM | POA: Diagnosis not present

## 2017-11-10 DIAGNOSIS — N2581 Secondary hyperparathyroidism of renal origin: Secondary | ICD-10-CM | POA: Diagnosis not present

## 2017-11-10 DIAGNOSIS — D509 Iron deficiency anemia, unspecified: Secondary | ICD-10-CM | POA: Diagnosis not present

## 2017-11-10 DIAGNOSIS — D631 Anemia in chronic kidney disease: Secondary | ICD-10-CM | POA: Diagnosis not present

## 2017-11-12 DIAGNOSIS — D631 Anemia in chronic kidney disease: Secondary | ICD-10-CM | POA: Diagnosis not present

## 2017-11-12 DIAGNOSIS — Z992 Dependence on renal dialysis: Secondary | ICD-10-CM | POA: Diagnosis not present

## 2017-11-12 DIAGNOSIS — D509 Iron deficiency anemia, unspecified: Secondary | ICD-10-CM | POA: Diagnosis not present

## 2017-11-12 DIAGNOSIS — N186 End stage renal disease: Secondary | ICD-10-CM | POA: Diagnosis not present

## 2017-11-12 DIAGNOSIS — N2581 Secondary hyperparathyroidism of renal origin: Secondary | ICD-10-CM | POA: Diagnosis not present

## 2017-11-13 ENCOUNTER — Ambulatory Visit: Payer: Medicare Other | Admitting: *Deleted

## 2017-11-14 DIAGNOSIS — Z992 Dependence on renal dialysis: Secondary | ICD-10-CM | POA: Diagnosis not present

## 2017-11-14 DIAGNOSIS — N2581 Secondary hyperparathyroidism of renal origin: Secondary | ICD-10-CM | POA: Diagnosis not present

## 2017-11-14 DIAGNOSIS — N186 End stage renal disease: Secondary | ICD-10-CM | POA: Diagnosis not present

## 2017-11-14 DIAGNOSIS — D631 Anemia in chronic kidney disease: Secondary | ICD-10-CM | POA: Diagnosis not present

## 2017-11-14 DIAGNOSIS — D509 Iron deficiency anemia, unspecified: Secondary | ICD-10-CM | POA: Diagnosis not present

## 2017-11-17 DIAGNOSIS — D631 Anemia in chronic kidney disease: Secondary | ICD-10-CM | POA: Diagnosis not present

## 2017-11-17 DIAGNOSIS — Z992 Dependence on renal dialysis: Secondary | ICD-10-CM | POA: Diagnosis not present

## 2017-11-17 DIAGNOSIS — N2581 Secondary hyperparathyroidism of renal origin: Secondary | ICD-10-CM | POA: Diagnosis not present

## 2017-11-17 DIAGNOSIS — N186 End stage renal disease: Secondary | ICD-10-CM | POA: Diagnosis not present

## 2017-11-17 DIAGNOSIS — D509 Iron deficiency anemia, unspecified: Secondary | ICD-10-CM | POA: Diagnosis not present

## 2017-11-19 DIAGNOSIS — N186 End stage renal disease: Secondary | ICD-10-CM | POA: Diagnosis not present

## 2017-11-19 DIAGNOSIS — D509 Iron deficiency anemia, unspecified: Secondary | ICD-10-CM | POA: Diagnosis not present

## 2017-11-19 DIAGNOSIS — N2581 Secondary hyperparathyroidism of renal origin: Secondary | ICD-10-CM | POA: Diagnosis not present

## 2017-11-19 DIAGNOSIS — D631 Anemia in chronic kidney disease: Secondary | ICD-10-CM | POA: Diagnosis not present

## 2017-11-19 DIAGNOSIS — Z992 Dependence on renal dialysis: Secondary | ICD-10-CM | POA: Diagnosis not present

## 2017-11-21 DIAGNOSIS — D631 Anemia in chronic kidney disease: Secondary | ICD-10-CM | POA: Diagnosis not present

## 2017-11-21 DIAGNOSIS — N186 End stage renal disease: Secondary | ICD-10-CM | POA: Diagnosis not present

## 2017-11-21 DIAGNOSIS — N2581 Secondary hyperparathyroidism of renal origin: Secondary | ICD-10-CM | POA: Diagnosis not present

## 2017-11-21 DIAGNOSIS — Z992 Dependence on renal dialysis: Secondary | ICD-10-CM | POA: Diagnosis not present

## 2017-11-21 DIAGNOSIS — D509 Iron deficiency anemia, unspecified: Secondary | ICD-10-CM | POA: Diagnosis not present

## 2017-11-24 DIAGNOSIS — D509 Iron deficiency anemia, unspecified: Secondary | ICD-10-CM | POA: Diagnosis not present

## 2017-11-24 DIAGNOSIS — N186 End stage renal disease: Secondary | ICD-10-CM | POA: Diagnosis not present

## 2017-11-24 DIAGNOSIS — D631 Anemia in chronic kidney disease: Secondary | ICD-10-CM | POA: Diagnosis not present

## 2017-11-24 DIAGNOSIS — Z992 Dependence on renal dialysis: Secondary | ICD-10-CM | POA: Diagnosis not present

## 2017-11-24 DIAGNOSIS — N2581 Secondary hyperparathyroidism of renal origin: Secondary | ICD-10-CM | POA: Diagnosis not present

## 2017-11-26 DIAGNOSIS — Z992 Dependence on renal dialysis: Secondary | ICD-10-CM | POA: Diagnosis not present

## 2017-11-26 DIAGNOSIS — N2581 Secondary hyperparathyroidism of renal origin: Secondary | ICD-10-CM | POA: Diagnosis not present

## 2017-11-26 DIAGNOSIS — D509 Iron deficiency anemia, unspecified: Secondary | ICD-10-CM | POA: Diagnosis not present

## 2017-11-26 DIAGNOSIS — N186 End stage renal disease: Secondary | ICD-10-CM | POA: Diagnosis not present

## 2017-11-26 DIAGNOSIS — D631 Anemia in chronic kidney disease: Secondary | ICD-10-CM | POA: Diagnosis not present

## 2017-11-28 DIAGNOSIS — D631 Anemia in chronic kidney disease: Secondary | ICD-10-CM | POA: Diagnosis not present

## 2017-11-28 DIAGNOSIS — Z992 Dependence on renal dialysis: Secondary | ICD-10-CM | POA: Diagnosis not present

## 2017-11-28 DIAGNOSIS — N186 End stage renal disease: Secondary | ICD-10-CM | POA: Diagnosis not present

## 2017-11-28 DIAGNOSIS — N2581 Secondary hyperparathyroidism of renal origin: Secondary | ICD-10-CM | POA: Diagnosis not present

## 2017-11-28 DIAGNOSIS — D509 Iron deficiency anemia, unspecified: Secondary | ICD-10-CM | POA: Diagnosis not present

## 2017-12-01 DIAGNOSIS — Z992 Dependence on renal dialysis: Secondary | ICD-10-CM | POA: Diagnosis not present

## 2017-12-01 DIAGNOSIS — D631 Anemia in chronic kidney disease: Secondary | ICD-10-CM | POA: Diagnosis not present

## 2017-12-01 DIAGNOSIS — N2581 Secondary hyperparathyroidism of renal origin: Secondary | ICD-10-CM | POA: Diagnosis not present

## 2017-12-01 DIAGNOSIS — D509 Iron deficiency anemia, unspecified: Secondary | ICD-10-CM | POA: Diagnosis not present

## 2017-12-01 DIAGNOSIS — N186 End stage renal disease: Secondary | ICD-10-CM | POA: Diagnosis not present

## 2017-12-03 DIAGNOSIS — D509 Iron deficiency anemia, unspecified: Secondary | ICD-10-CM | POA: Diagnosis not present

## 2017-12-03 DIAGNOSIS — N186 End stage renal disease: Secondary | ICD-10-CM | POA: Diagnosis not present

## 2017-12-03 DIAGNOSIS — D631 Anemia in chronic kidney disease: Secondary | ICD-10-CM | POA: Diagnosis not present

## 2017-12-03 DIAGNOSIS — N2581 Secondary hyperparathyroidism of renal origin: Secondary | ICD-10-CM | POA: Diagnosis not present

## 2017-12-03 DIAGNOSIS — Z992 Dependence on renal dialysis: Secondary | ICD-10-CM | POA: Diagnosis not present

## 2017-12-05 DIAGNOSIS — D509 Iron deficiency anemia, unspecified: Secondary | ICD-10-CM | POA: Diagnosis not present

## 2017-12-05 DIAGNOSIS — D631 Anemia in chronic kidney disease: Secondary | ICD-10-CM | POA: Diagnosis not present

## 2017-12-05 DIAGNOSIS — N186 End stage renal disease: Secondary | ICD-10-CM | POA: Diagnosis not present

## 2017-12-05 DIAGNOSIS — N2581 Secondary hyperparathyroidism of renal origin: Secondary | ICD-10-CM | POA: Diagnosis not present

## 2017-12-05 DIAGNOSIS — Z992 Dependence on renal dialysis: Secondary | ICD-10-CM | POA: Diagnosis not present

## 2017-12-08 DIAGNOSIS — N2581 Secondary hyperparathyroidism of renal origin: Secondary | ICD-10-CM | POA: Diagnosis not present

## 2017-12-08 DIAGNOSIS — N186 End stage renal disease: Secondary | ICD-10-CM | POA: Diagnosis not present

## 2017-12-08 DIAGNOSIS — D631 Anemia in chronic kidney disease: Secondary | ICD-10-CM | POA: Diagnosis not present

## 2017-12-08 DIAGNOSIS — Z992 Dependence on renal dialysis: Secondary | ICD-10-CM | POA: Diagnosis not present

## 2017-12-08 DIAGNOSIS — D509 Iron deficiency anemia, unspecified: Secondary | ICD-10-CM | POA: Diagnosis not present

## 2017-12-10 DIAGNOSIS — N2581 Secondary hyperparathyroidism of renal origin: Secondary | ICD-10-CM | POA: Diagnosis not present

## 2017-12-10 DIAGNOSIS — D631 Anemia in chronic kidney disease: Secondary | ICD-10-CM | POA: Diagnosis not present

## 2017-12-10 DIAGNOSIS — Z992 Dependence on renal dialysis: Secondary | ICD-10-CM | POA: Diagnosis not present

## 2017-12-10 DIAGNOSIS — N186 End stage renal disease: Secondary | ICD-10-CM | POA: Diagnosis not present

## 2017-12-10 DIAGNOSIS — D509 Iron deficiency anemia, unspecified: Secondary | ICD-10-CM | POA: Diagnosis not present

## 2017-12-12 DIAGNOSIS — N2581 Secondary hyperparathyroidism of renal origin: Secondary | ICD-10-CM | POA: Diagnosis not present

## 2017-12-12 DIAGNOSIS — Z992 Dependence on renal dialysis: Secondary | ICD-10-CM | POA: Diagnosis not present

## 2017-12-12 DIAGNOSIS — N186 End stage renal disease: Secondary | ICD-10-CM | POA: Diagnosis not present

## 2017-12-12 DIAGNOSIS — D631 Anemia in chronic kidney disease: Secondary | ICD-10-CM | POA: Diagnosis not present

## 2017-12-12 DIAGNOSIS — D509 Iron deficiency anemia, unspecified: Secondary | ICD-10-CM | POA: Diagnosis not present

## 2017-12-15 DIAGNOSIS — D631 Anemia in chronic kidney disease: Secondary | ICD-10-CM | POA: Diagnosis not present

## 2017-12-15 DIAGNOSIS — Z992 Dependence on renal dialysis: Secondary | ICD-10-CM | POA: Diagnosis not present

## 2017-12-15 DIAGNOSIS — N2581 Secondary hyperparathyroidism of renal origin: Secondary | ICD-10-CM | POA: Diagnosis not present

## 2017-12-15 DIAGNOSIS — D509 Iron deficiency anemia, unspecified: Secondary | ICD-10-CM | POA: Diagnosis not present

## 2017-12-15 DIAGNOSIS — N186 End stage renal disease: Secondary | ICD-10-CM | POA: Diagnosis not present

## 2017-12-17 DIAGNOSIS — D509 Iron deficiency anemia, unspecified: Secondary | ICD-10-CM | POA: Diagnosis not present

## 2017-12-17 DIAGNOSIS — Z992 Dependence on renal dialysis: Secondary | ICD-10-CM | POA: Diagnosis not present

## 2017-12-17 DIAGNOSIS — N186 End stage renal disease: Secondary | ICD-10-CM | POA: Diagnosis not present

## 2017-12-17 DIAGNOSIS — D631 Anemia in chronic kidney disease: Secondary | ICD-10-CM | POA: Diagnosis not present

## 2017-12-17 DIAGNOSIS — N2581 Secondary hyperparathyroidism of renal origin: Secondary | ICD-10-CM | POA: Diagnosis not present

## 2017-12-19 DIAGNOSIS — D631 Anemia in chronic kidney disease: Secondary | ICD-10-CM | POA: Diagnosis not present

## 2017-12-19 DIAGNOSIS — Z992 Dependence on renal dialysis: Secondary | ICD-10-CM | POA: Diagnosis not present

## 2017-12-19 DIAGNOSIS — D509 Iron deficiency anemia, unspecified: Secondary | ICD-10-CM | POA: Diagnosis not present

## 2017-12-19 DIAGNOSIS — N186 End stage renal disease: Secondary | ICD-10-CM | POA: Diagnosis not present

## 2017-12-19 DIAGNOSIS — N2581 Secondary hyperparathyroidism of renal origin: Secondary | ICD-10-CM | POA: Diagnosis not present

## 2017-12-22 DIAGNOSIS — D509 Iron deficiency anemia, unspecified: Secondary | ICD-10-CM | POA: Diagnosis not present

## 2017-12-22 DIAGNOSIS — D631 Anemia in chronic kidney disease: Secondary | ICD-10-CM | POA: Diagnosis not present

## 2017-12-22 DIAGNOSIS — N2581 Secondary hyperparathyroidism of renal origin: Secondary | ICD-10-CM | POA: Diagnosis not present

## 2017-12-22 DIAGNOSIS — Z992 Dependence on renal dialysis: Secondary | ICD-10-CM | POA: Diagnosis not present

## 2017-12-22 DIAGNOSIS — N186 End stage renal disease: Secondary | ICD-10-CM | POA: Diagnosis not present

## 2017-12-24 DIAGNOSIS — D631 Anemia in chronic kidney disease: Secondary | ICD-10-CM | POA: Diagnosis not present

## 2017-12-24 DIAGNOSIS — D509 Iron deficiency anemia, unspecified: Secondary | ICD-10-CM | POA: Diagnosis not present

## 2017-12-24 DIAGNOSIS — N2581 Secondary hyperparathyroidism of renal origin: Secondary | ICD-10-CM | POA: Diagnosis not present

## 2017-12-24 DIAGNOSIS — Z992 Dependence on renal dialysis: Secondary | ICD-10-CM | POA: Diagnosis not present

## 2017-12-24 DIAGNOSIS — N186 End stage renal disease: Secondary | ICD-10-CM | POA: Diagnosis not present

## 2017-12-26 DIAGNOSIS — D509 Iron deficiency anemia, unspecified: Secondary | ICD-10-CM | POA: Diagnosis not present

## 2017-12-26 DIAGNOSIS — N186 End stage renal disease: Secondary | ICD-10-CM | POA: Diagnosis not present

## 2017-12-26 DIAGNOSIS — Z992 Dependence on renal dialysis: Secondary | ICD-10-CM | POA: Diagnosis not present

## 2017-12-26 DIAGNOSIS — D631 Anemia in chronic kidney disease: Secondary | ICD-10-CM | POA: Diagnosis not present

## 2017-12-26 DIAGNOSIS — N2581 Secondary hyperparathyroidism of renal origin: Secondary | ICD-10-CM | POA: Diagnosis not present

## 2017-12-29 DIAGNOSIS — Z992 Dependence on renal dialysis: Secondary | ICD-10-CM | POA: Diagnosis not present

## 2017-12-29 DIAGNOSIS — D509 Iron deficiency anemia, unspecified: Secondary | ICD-10-CM | POA: Diagnosis not present

## 2017-12-29 DIAGNOSIS — N186 End stage renal disease: Secondary | ICD-10-CM | POA: Diagnosis not present

## 2017-12-29 DIAGNOSIS — N2581 Secondary hyperparathyroidism of renal origin: Secondary | ICD-10-CM | POA: Diagnosis not present

## 2017-12-29 DIAGNOSIS — D631 Anemia in chronic kidney disease: Secondary | ICD-10-CM | POA: Diagnosis not present

## 2017-12-31 DIAGNOSIS — D509 Iron deficiency anemia, unspecified: Secondary | ICD-10-CM | POA: Diagnosis not present

## 2017-12-31 DIAGNOSIS — D631 Anemia in chronic kidney disease: Secondary | ICD-10-CM | POA: Diagnosis not present

## 2017-12-31 DIAGNOSIS — N2581 Secondary hyperparathyroidism of renal origin: Secondary | ICD-10-CM | POA: Diagnosis not present

## 2017-12-31 DIAGNOSIS — Z992 Dependence on renal dialysis: Secondary | ICD-10-CM | POA: Diagnosis not present

## 2017-12-31 DIAGNOSIS — N186 End stage renal disease: Secondary | ICD-10-CM | POA: Diagnosis not present

## 2018-01-02 DIAGNOSIS — N186 End stage renal disease: Secondary | ICD-10-CM | POA: Diagnosis not present

## 2018-01-02 DIAGNOSIS — D631 Anemia in chronic kidney disease: Secondary | ICD-10-CM | POA: Diagnosis not present

## 2018-01-02 DIAGNOSIS — N2581 Secondary hyperparathyroidism of renal origin: Secondary | ICD-10-CM | POA: Diagnosis not present

## 2018-01-02 DIAGNOSIS — D509 Iron deficiency anemia, unspecified: Secondary | ICD-10-CM | POA: Diagnosis not present

## 2018-01-02 DIAGNOSIS — Z992 Dependence on renal dialysis: Secondary | ICD-10-CM | POA: Diagnosis not present

## 2018-01-05 DIAGNOSIS — N186 End stage renal disease: Secondary | ICD-10-CM | POA: Diagnosis not present

## 2018-01-05 DIAGNOSIS — D631 Anemia in chronic kidney disease: Secondary | ICD-10-CM | POA: Diagnosis not present

## 2018-01-05 DIAGNOSIS — D509 Iron deficiency anemia, unspecified: Secondary | ICD-10-CM | POA: Diagnosis not present

## 2018-01-05 DIAGNOSIS — N2581 Secondary hyperparathyroidism of renal origin: Secondary | ICD-10-CM | POA: Diagnosis not present

## 2018-01-05 DIAGNOSIS — Z992 Dependence on renal dialysis: Secondary | ICD-10-CM | POA: Diagnosis not present

## 2018-01-06 ENCOUNTER — Ambulatory Visit: Payer: Medicare Other | Admitting: *Deleted

## 2018-01-07 DIAGNOSIS — Z992 Dependence on renal dialysis: Secondary | ICD-10-CM | POA: Diagnosis not present

## 2018-01-07 DIAGNOSIS — D631 Anemia in chronic kidney disease: Secondary | ICD-10-CM | POA: Diagnosis not present

## 2018-01-07 DIAGNOSIS — D509 Iron deficiency anemia, unspecified: Secondary | ICD-10-CM | POA: Diagnosis not present

## 2018-01-07 DIAGNOSIS — N2581 Secondary hyperparathyroidism of renal origin: Secondary | ICD-10-CM | POA: Diagnosis not present

## 2018-01-07 DIAGNOSIS — N186 End stage renal disease: Secondary | ICD-10-CM | POA: Diagnosis not present

## 2018-01-09 DIAGNOSIS — D509 Iron deficiency anemia, unspecified: Secondary | ICD-10-CM | POA: Diagnosis not present

## 2018-01-09 DIAGNOSIS — N186 End stage renal disease: Secondary | ICD-10-CM | POA: Diagnosis not present

## 2018-01-09 DIAGNOSIS — D631 Anemia in chronic kidney disease: Secondary | ICD-10-CM | POA: Diagnosis not present

## 2018-01-09 DIAGNOSIS — Z992 Dependence on renal dialysis: Secondary | ICD-10-CM | POA: Diagnosis not present

## 2018-01-09 DIAGNOSIS — N2581 Secondary hyperparathyroidism of renal origin: Secondary | ICD-10-CM | POA: Diagnosis not present

## 2018-01-12 DIAGNOSIS — N186 End stage renal disease: Secondary | ICD-10-CM | POA: Diagnosis not present

## 2018-01-12 DIAGNOSIS — D509 Iron deficiency anemia, unspecified: Secondary | ICD-10-CM | POA: Diagnosis not present

## 2018-01-12 DIAGNOSIS — N2581 Secondary hyperparathyroidism of renal origin: Secondary | ICD-10-CM | POA: Diagnosis not present

## 2018-01-12 DIAGNOSIS — D631 Anemia in chronic kidney disease: Secondary | ICD-10-CM | POA: Diagnosis not present

## 2018-01-12 DIAGNOSIS — Z992 Dependence on renal dialysis: Secondary | ICD-10-CM | POA: Diagnosis not present

## 2018-01-14 ENCOUNTER — Ambulatory Visit: Payer: Medicare Other | Admitting: Family Medicine

## 2018-01-14 DIAGNOSIS — D631 Anemia in chronic kidney disease: Secondary | ICD-10-CM | POA: Diagnosis not present

## 2018-01-14 DIAGNOSIS — N2581 Secondary hyperparathyroidism of renal origin: Secondary | ICD-10-CM | POA: Diagnosis not present

## 2018-01-14 DIAGNOSIS — N186 End stage renal disease: Secondary | ICD-10-CM | POA: Diagnosis not present

## 2018-01-14 DIAGNOSIS — D509 Iron deficiency anemia, unspecified: Secondary | ICD-10-CM | POA: Diagnosis not present

## 2018-01-14 DIAGNOSIS — Z992 Dependence on renal dialysis: Secondary | ICD-10-CM | POA: Diagnosis not present

## 2018-01-16 DIAGNOSIS — N2581 Secondary hyperparathyroidism of renal origin: Secondary | ICD-10-CM | POA: Diagnosis not present

## 2018-01-16 DIAGNOSIS — D509 Iron deficiency anemia, unspecified: Secondary | ICD-10-CM | POA: Diagnosis not present

## 2018-01-16 DIAGNOSIS — D631 Anemia in chronic kidney disease: Secondary | ICD-10-CM | POA: Diagnosis not present

## 2018-01-16 DIAGNOSIS — Z992 Dependence on renal dialysis: Secondary | ICD-10-CM | POA: Diagnosis not present

## 2018-01-16 DIAGNOSIS — N186 End stage renal disease: Secondary | ICD-10-CM | POA: Diagnosis not present

## 2018-01-19 DIAGNOSIS — D631 Anemia in chronic kidney disease: Secondary | ICD-10-CM | POA: Diagnosis not present

## 2018-01-19 DIAGNOSIS — N186 End stage renal disease: Secondary | ICD-10-CM | POA: Diagnosis not present

## 2018-01-19 DIAGNOSIS — Z794 Long term (current) use of insulin: Secondary | ICD-10-CM | POA: Diagnosis not present

## 2018-01-19 DIAGNOSIS — N2581 Secondary hyperparathyroidism of renal origin: Secondary | ICD-10-CM | POA: Diagnosis not present

## 2018-01-19 DIAGNOSIS — E119 Type 2 diabetes mellitus without complications: Secondary | ICD-10-CM | POA: Diagnosis not present

## 2018-01-19 DIAGNOSIS — Z992 Dependence on renal dialysis: Secondary | ICD-10-CM | POA: Diagnosis not present

## 2018-01-19 DIAGNOSIS — D509 Iron deficiency anemia, unspecified: Secondary | ICD-10-CM | POA: Diagnosis not present

## 2018-01-20 ENCOUNTER — Ambulatory Visit: Payer: Medicare Other | Admitting: Family Medicine

## 2018-01-20 DIAGNOSIS — E113513 Type 2 diabetes mellitus with proliferative diabetic retinopathy with macular edema, bilateral: Secondary | ICD-10-CM | POA: Diagnosis not present

## 2018-01-20 LAB — HM DIABETES EYE EXAM

## 2018-01-21 DIAGNOSIS — Z992 Dependence on renal dialysis: Secondary | ICD-10-CM | POA: Diagnosis not present

## 2018-01-21 DIAGNOSIS — N2581 Secondary hyperparathyroidism of renal origin: Secondary | ICD-10-CM | POA: Diagnosis not present

## 2018-01-21 DIAGNOSIS — D631 Anemia in chronic kidney disease: Secondary | ICD-10-CM | POA: Diagnosis not present

## 2018-01-21 DIAGNOSIS — N186 End stage renal disease: Secondary | ICD-10-CM | POA: Diagnosis not present

## 2018-01-21 DIAGNOSIS — D509 Iron deficiency anemia, unspecified: Secondary | ICD-10-CM | POA: Diagnosis not present

## 2018-01-22 ENCOUNTER — Encounter (INDEPENDENT_AMBULATORY_CARE_PROVIDER_SITE_OTHER): Payer: Medicare Other | Admitting: Ophthalmology

## 2018-01-23 DIAGNOSIS — N186 End stage renal disease: Secondary | ICD-10-CM | POA: Diagnosis not present

## 2018-01-23 DIAGNOSIS — D509 Iron deficiency anemia, unspecified: Secondary | ICD-10-CM | POA: Diagnosis not present

## 2018-01-23 DIAGNOSIS — N2581 Secondary hyperparathyroidism of renal origin: Secondary | ICD-10-CM | POA: Diagnosis not present

## 2018-01-23 DIAGNOSIS — Z992 Dependence on renal dialysis: Secondary | ICD-10-CM | POA: Diagnosis not present

## 2018-01-23 DIAGNOSIS — D631 Anemia in chronic kidney disease: Secondary | ICD-10-CM | POA: Diagnosis not present

## 2018-01-26 DIAGNOSIS — N186 End stage renal disease: Secondary | ICD-10-CM | POA: Diagnosis not present

## 2018-01-26 DIAGNOSIS — N2581 Secondary hyperparathyroidism of renal origin: Secondary | ICD-10-CM | POA: Diagnosis not present

## 2018-01-26 DIAGNOSIS — D631 Anemia in chronic kidney disease: Secondary | ICD-10-CM | POA: Diagnosis not present

## 2018-01-26 DIAGNOSIS — Z992 Dependence on renal dialysis: Secondary | ICD-10-CM | POA: Diagnosis not present

## 2018-01-26 DIAGNOSIS — D509 Iron deficiency anemia, unspecified: Secondary | ICD-10-CM | POA: Diagnosis not present

## 2018-01-28 DIAGNOSIS — N2581 Secondary hyperparathyroidism of renal origin: Secondary | ICD-10-CM | POA: Diagnosis not present

## 2018-01-28 DIAGNOSIS — N186 End stage renal disease: Secondary | ICD-10-CM | POA: Diagnosis not present

## 2018-01-28 DIAGNOSIS — Z23 Encounter for immunization: Secondary | ICD-10-CM | POA: Diagnosis not present

## 2018-01-28 DIAGNOSIS — D631 Anemia in chronic kidney disease: Secondary | ICD-10-CM | POA: Diagnosis not present

## 2018-01-28 DIAGNOSIS — Z992 Dependence on renal dialysis: Secondary | ICD-10-CM | POA: Diagnosis not present

## 2018-01-28 DIAGNOSIS — D509 Iron deficiency anemia, unspecified: Secondary | ICD-10-CM | POA: Diagnosis not present

## 2018-01-30 DIAGNOSIS — D509 Iron deficiency anemia, unspecified: Secondary | ICD-10-CM | POA: Diagnosis not present

## 2018-01-30 DIAGNOSIS — N186 End stage renal disease: Secondary | ICD-10-CM | POA: Diagnosis not present

## 2018-01-30 DIAGNOSIS — Z23 Encounter for immunization: Secondary | ICD-10-CM | POA: Diagnosis not present

## 2018-01-30 DIAGNOSIS — D631 Anemia in chronic kidney disease: Secondary | ICD-10-CM | POA: Diagnosis not present

## 2018-01-30 DIAGNOSIS — N2581 Secondary hyperparathyroidism of renal origin: Secondary | ICD-10-CM | POA: Diagnosis not present

## 2018-01-30 DIAGNOSIS — Z992 Dependence on renal dialysis: Secondary | ICD-10-CM | POA: Diagnosis not present

## 2018-02-02 DIAGNOSIS — Z23 Encounter for immunization: Secondary | ICD-10-CM | POA: Diagnosis not present

## 2018-02-02 DIAGNOSIS — Z992 Dependence on renal dialysis: Secondary | ICD-10-CM | POA: Diagnosis not present

## 2018-02-02 DIAGNOSIS — D509 Iron deficiency anemia, unspecified: Secondary | ICD-10-CM | POA: Diagnosis not present

## 2018-02-02 DIAGNOSIS — N2581 Secondary hyperparathyroidism of renal origin: Secondary | ICD-10-CM | POA: Diagnosis not present

## 2018-02-02 DIAGNOSIS — N186 End stage renal disease: Secondary | ICD-10-CM | POA: Diagnosis not present

## 2018-02-02 DIAGNOSIS — D631 Anemia in chronic kidney disease: Secondary | ICD-10-CM | POA: Diagnosis not present

## 2018-02-04 DIAGNOSIS — Z992 Dependence on renal dialysis: Secondary | ICD-10-CM | POA: Diagnosis not present

## 2018-02-04 DIAGNOSIS — D509 Iron deficiency anemia, unspecified: Secondary | ICD-10-CM | POA: Diagnosis not present

## 2018-02-04 DIAGNOSIS — N2581 Secondary hyperparathyroidism of renal origin: Secondary | ICD-10-CM | POA: Diagnosis not present

## 2018-02-04 DIAGNOSIS — N186 End stage renal disease: Secondary | ICD-10-CM | POA: Diagnosis not present

## 2018-02-04 DIAGNOSIS — D631 Anemia in chronic kidney disease: Secondary | ICD-10-CM | POA: Diagnosis not present

## 2018-02-04 DIAGNOSIS — Z23 Encounter for immunization: Secondary | ICD-10-CM | POA: Diagnosis not present

## 2018-02-05 DIAGNOSIS — B351 Tinea unguium: Secondary | ICD-10-CM | POA: Diagnosis not present

## 2018-02-05 DIAGNOSIS — E1151 Type 2 diabetes mellitus with diabetic peripheral angiopathy without gangrene: Secondary | ICD-10-CM | POA: Diagnosis not present

## 2018-02-05 DIAGNOSIS — M79671 Pain in right foot: Secondary | ICD-10-CM | POA: Diagnosis not present

## 2018-02-05 DIAGNOSIS — M79672 Pain in left foot: Secondary | ICD-10-CM | POA: Diagnosis not present

## 2018-02-06 DIAGNOSIS — D631 Anemia in chronic kidney disease: Secondary | ICD-10-CM | POA: Diagnosis not present

## 2018-02-06 DIAGNOSIS — Z992 Dependence on renal dialysis: Secondary | ICD-10-CM | POA: Diagnosis not present

## 2018-02-06 DIAGNOSIS — Z23 Encounter for immunization: Secondary | ICD-10-CM | POA: Diagnosis not present

## 2018-02-06 DIAGNOSIS — N2581 Secondary hyperparathyroidism of renal origin: Secondary | ICD-10-CM | POA: Diagnosis not present

## 2018-02-06 DIAGNOSIS — N186 End stage renal disease: Secondary | ICD-10-CM | POA: Diagnosis not present

## 2018-02-06 DIAGNOSIS — D509 Iron deficiency anemia, unspecified: Secondary | ICD-10-CM | POA: Diagnosis not present

## 2018-02-09 DIAGNOSIS — Z992 Dependence on renal dialysis: Secondary | ICD-10-CM | POA: Diagnosis not present

## 2018-02-09 DIAGNOSIS — N186 End stage renal disease: Secondary | ICD-10-CM | POA: Diagnosis not present

## 2018-02-09 DIAGNOSIS — N2581 Secondary hyperparathyroidism of renal origin: Secondary | ICD-10-CM | POA: Diagnosis not present

## 2018-02-09 DIAGNOSIS — D631 Anemia in chronic kidney disease: Secondary | ICD-10-CM | POA: Diagnosis not present

## 2018-02-09 DIAGNOSIS — D509 Iron deficiency anemia, unspecified: Secondary | ICD-10-CM | POA: Diagnosis not present

## 2018-02-09 DIAGNOSIS — Z23 Encounter for immunization: Secondary | ICD-10-CM | POA: Diagnosis not present

## 2018-02-10 ENCOUNTER — Other Ambulatory Visit: Payer: Self-pay | Admitting: Family Medicine

## 2018-02-11 DIAGNOSIS — D509 Iron deficiency anemia, unspecified: Secondary | ICD-10-CM | POA: Diagnosis not present

## 2018-02-11 DIAGNOSIS — Z992 Dependence on renal dialysis: Secondary | ICD-10-CM | POA: Diagnosis not present

## 2018-02-11 DIAGNOSIS — N2581 Secondary hyperparathyroidism of renal origin: Secondary | ICD-10-CM | POA: Diagnosis not present

## 2018-02-11 DIAGNOSIS — N186 End stage renal disease: Secondary | ICD-10-CM | POA: Diagnosis not present

## 2018-02-11 DIAGNOSIS — Z23 Encounter for immunization: Secondary | ICD-10-CM | POA: Diagnosis not present

## 2018-02-11 DIAGNOSIS — D631 Anemia in chronic kidney disease: Secondary | ICD-10-CM | POA: Diagnosis not present

## 2018-02-13 DIAGNOSIS — D631 Anemia in chronic kidney disease: Secondary | ICD-10-CM | POA: Diagnosis not present

## 2018-02-13 DIAGNOSIS — N2581 Secondary hyperparathyroidism of renal origin: Secondary | ICD-10-CM | POA: Diagnosis not present

## 2018-02-13 DIAGNOSIS — Z23 Encounter for immunization: Secondary | ICD-10-CM | POA: Diagnosis not present

## 2018-02-13 DIAGNOSIS — N186 End stage renal disease: Secondary | ICD-10-CM | POA: Diagnosis not present

## 2018-02-13 DIAGNOSIS — D509 Iron deficiency anemia, unspecified: Secondary | ICD-10-CM | POA: Diagnosis not present

## 2018-02-13 DIAGNOSIS — Z992 Dependence on renal dialysis: Secondary | ICD-10-CM | POA: Diagnosis not present

## 2018-02-16 DIAGNOSIS — Z23 Encounter for immunization: Secondary | ICD-10-CM | POA: Diagnosis not present

## 2018-02-16 DIAGNOSIS — N2581 Secondary hyperparathyroidism of renal origin: Secondary | ICD-10-CM | POA: Diagnosis not present

## 2018-02-16 DIAGNOSIS — Z992 Dependence on renal dialysis: Secondary | ICD-10-CM | POA: Diagnosis not present

## 2018-02-16 DIAGNOSIS — D631 Anemia in chronic kidney disease: Secondary | ICD-10-CM | POA: Diagnosis not present

## 2018-02-16 DIAGNOSIS — N186 End stage renal disease: Secondary | ICD-10-CM | POA: Diagnosis not present

## 2018-02-16 DIAGNOSIS — D509 Iron deficiency anemia, unspecified: Secondary | ICD-10-CM | POA: Diagnosis not present

## 2018-02-18 DIAGNOSIS — Z23 Encounter for immunization: Secondary | ICD-10-CM | POA: Diagnosis not present

## 2018-02-18 DIAGNOSIS — N2581 Secondary hyperparathyroidism of renal origin: Secondary | ICD-10-CM | POA: Diagnosis not present

## 2018-02-18 DIAGNOSIS — Z992 Dependence on renal dialysis: Secondary | ICD-10-CM | POA: Diagnosis not present

## 2018-02-18 DIAGNOSIS — N186 End stage renal disease: Secondary | ICD-10-CM | POA: Diagnosis not present

## 2018-02-18 DIAGNOSIS — D509 Iron deficiency anemia, unspecified: Secondary | ICD-10-CM | POA: Diagnosis not present

## 2018-02-18 DIAGNOSIS — D631 Anemia in chronic kidney disease: Secondary | ICD-10-CM | POA: Diagnosis not present

## 2018-02-20 DIAGNOSIS — D631 Anemia in chronic kidney disease: Secondary | ICD-10-CM | POA: Diagnosis not present

## 2018-02-20 DIAGNOSIS — D509 Iron deficiency anemia, unspecified: Secondary | ICD-10-CM | POA: Diagnosis not present

## 2018-02-20 DIAGNOSIS — Z992 Dependence on renal dialysis: Secondary | ICD-10-CM | POA: Diagnosis not present

## 2018-02-20 DIAGNOSIS — Z23 Encounter for immunization: Secondary | ICD-10-CM | POA: Diagnosis not present

## 2018-02-20 DIAGNOSIS — N186 End stage renal disease: Secondary | ICD-10-CM | POA: Diagnosis not present

## 2018-02-20 DIAGNOSIS — N2581 Secondary hyperparathyroidism of renal origin: Secondary | ICD-10-CM | POA: Diagnosis not present

## 2018-02-23 DIAGNOSIS — Z992 Dependence on renal dialysis: Secondary | ICD-10-CM | POA: Diagnosis not present

## 2018-02-23 DIAGNOSIS — N2581 Secondary hyperparathyroidism of renal origin: Secondary | ICD-10-CM | POA: Diagnosis not present

## 2018-02-23 DIAGNOSIS — Z23 Encounter for immunization: Secondary | ICD-10-CM | POA: Diagnosis not present

## 2018-02-23 DIAGNOSIS — D631 Anemia in chronic kidney disease: Secondary | ICD-10-CM | POA: Diagnosis not present

## 2018-02-23 DIAGNOSIS — D509 Iron deficiency anemia, unspecified: Secondary | ICD-10-CM | POA: Diagnosis not present

## 2018-02-23 DIAGNOSIS — N186 End stage renal disease: Secondary | ICD-10-CM | POA: Diagnosis not present

## 2018-02-25 DIAGNOSIS — N2581 Secondary hyperparathyroidism of renal origin: Secondary | ICD-10-CM | POA: Diagnosis not present

## 2018-02-25 DIAGNOSIS — N186 End stage renal disease: Secondary | ICD-10-CM | POA: Diagnosis not present

## 2018-02-25 DIAGNOSIS — D509 Iron deficiency anemia, unspecified: Secondary | ICD-10-CM | POA: Diagnosis not present

## 2018-02-25 DIAGNOSIS — Z23 Encounter for immunization: Secondary | ICD-10-CM | POA: Diagnosis not present

## 2018-02-25 DIAGNOSIS — Z992 Dependence on renal dialysis: Secondary | ICD-10-CM | POA: Diagnosis not present

## 2018-02-25 DIAGNOSIS — D631 Anemia in chronic kidney disease: Secondary | ICD-10-CM | POA: Diagnosis not present

## 2018-02-26 DIAGNOSIS — Z992 Dependence on renal dialysis: Secondary | ICD-10-CM | POA: Diagnosis not present

## 2018-02-26 DIAGNOSIS — N186 End stage renal disease: Secondary | ICD-10-CM | POA: Diagnosis not present

## 2018-02-27 DIAGNOSIS — N2581 Secondary hyperparathyroidism of renal origin: Secondary | ICD-10-CM | POA: Diagnosis not present

## 2018-02-27 DIAGNOSIS — D509 Iron deficiency anemia, unspecified: Secondary | ICD-10-CM | POA: Diagnosis not present

## 2018-02-27 DIAGNOSIS — N186 End stage renal disease: Secondary | ICD-10-CM | POA: Diagnosis not present

## 2018-02-27 DIAGNOSIS — Z992 Dependence on renal dialysis: Secondary | ICD-10-CM | POA: Diagnosis not present

## 2018-02-27 DIAGNOSIS — D631 Anemia in chronic kidney disease: Secondary | ICD-10-CM | POA: Diagnosis not present

## 2018-03-02 DIAGNOSIS — N2581 Secondary hyperparathyroidism of renal origin: Secondary | ICD-10-CM | POA: Diagnosis not present

## 2018-03-02 DIAGNOSIS — D509 Iron deficiency anemia, unspecified: Secondary | ICD-10-CM | POA: Diagnosis not present

## 2018-03-02 DIAGNOSIS — D631 Anemia in chronic kidney disease: Secondary | ICD-10-CM | POA: Diagnosis not present

## 2018-03-02 DIAGNOSIS — Z992 Dependence on renal dialysis: Secondary | ICD-10-CM | POA: Diagnosis not present

## 2018-03-02 DIAGNOSIS — N186 End stage renal disease: Secondary | ICD-10-CM | POA: Diagnosis not present

## 2018-03-04 DIAGNOSIS — N2581 Secondary hyperparathyroidism of renal origin: Secondary | ICD-10-CM | POA: Diagnosis not present

## 2018-03-04 DIAGNOSIS — D509 Iron deficiency anemia, unspecified: Secondary | ICD-10-CM | POA: Diagnosis not present

## 2018-03-04 DIAGNOSIS — N186 End stage renal disease: Secondary | ICD-10-CM | POA: Diagnosis not present

## 2018-03-04 DIAGNOSIS — Z992 Dependence on renal dialysis: Secondary | ICD-10-CM | POA: Diagnosis not present

## 2018-03-04 DIAGNOSIS — D631 Anemia in chronic kidney disease: Secondary | ICD-10-CM | POA: Diagnosis not present

## 2018-03-06 DIAGNOSIS — N186 End stage renal disease: Secondary | ICD-10-CM | POA: Diagnosis not present

## 2018-03-06 DIAGNOSIS — D509 Iron deficiency anemia, unspecified: Secondary | ICD-10-CM | POA: Diagnosis not present

## 2018-03-06 DIAGNOSIS — Z992 Dependence on renal dialysis: Secondary | ICD-10-CM | POA: Diagnosis not present

## 2018-03-06 DIAGNOSIS — D631 Anemia in chronic kidney disease: Secondary | ICD-10-CM | POA: Diagnosis not present

## 2018-03-06 DIAGNOSIS — N2581 Secondary hyperparathyroidism of renal origin: Secondary | ICD-10-CM | POA: Diagnosis not present

## 2018-03-09 DIAGNOSIS — D631 Anemia in chronic kidney disease: Secondary | ICD-10-CM | POA: Diagnosis not present

## 2018-03-09 DIAGNOSIS — Z992 Dependence on renal dialysis: Secondary | ICD-10-CM | POA: Diagnosis not present

## 2018-03-09 DIAGNOSIS — N186 End stage renal disease: Secondary | ICD-10-CM | POA: Diagnosis not present

## 2018-03-09 DIAGNOSIS — D509 Iron deficiency anemia, unspecified: Secondary | ICD-10-CM | POA: Diagnosis not present

## 2018-03-09 DIAGNOSIS — N2581 Secondary hyperparathyroidism of renal origin: Secondary | ICD-10-CM | POA: Diagnosis not present

## 2018-03-11 DIAGNOSIS — Z992 Dependence on renal dialysis: Secondary | ICD-10-CM | POA: Diagnosis not present

## 2018-03-11 DIAGNOSIS — D631 Anemia in chronic kidney disease: Secondary | ICD-10-CM | POA: Diagnosis not present

## 2018-03-11 DIAGNOSIS — N2581 Secondary hyperparathyroidism of renal origin: Secondary | ICD-10-CM | POA: Diagnosis not present

## 2018-03-11 DIAGNOSIS — N186 End stage renal disease: Secondary | ICD-10-CM | POA: Diagnosis not present

## 2018-03-11 DIAGNOSIS — D509 Iron deficiency anemia, unspecified: Secondary | ICD-10-CM | POA: Diagnosis not present

## 2018-03-13 DIAGNOSIS — D631 Anemia in chronic kidney disease: Secondary | ICD-10-CM | POA: Diagnosis not present

## 2018-03-13 DIAGNOSIS — D509 Iron deficiency anemia, unspecified: Secondary | ICD-10-CM | POA: Diagnosis not present

## 2018-03-13 DIAGNOSIS — N186 End stage renal disease: Secondary | ICD-10-CM | POA: Diagnosis not present

## 2018-03-13 DIAGNOSIS — Z992 Dependence on renal dialysis: Secondary | ICD-10-CM | POA: Diagnosis not present

## 2018-03-13 DIAGNOSIS — N2581 Secondary hyperparathyroidism of renal origin: Secondary | ICD-10-CM | POA: Diagnosis not present

## 2018-03-16 DIAGNOSIS — Z992 Dependence on renal dialysis: Secondary | ICD-10-CM | POA: Diagnosis not present

## 2018-03-16 DIAGNOSIS — D509 Iron deficiency anemia, unspecified: Secondary | ICD-10-CM | POA: Diagnosis not present

## 2018-03-16 DIAGNOSIS — N2581 Secondary hyperparathyroidism of renal origin: Secondary | ICD-10-CM | POA: Diagnosis not present

## 2018-03-16 DIAGNOSIS — D631 Anemia in chronic kidney disease: Secondary | ICD-10-CM | POA: Diagnosis not present

## 2018-03-16 DIAGNOSIS — N186 End stage renal disease: Secondary | ICD-10-CM | POA: Diagnosis not present

## 2018-03-18 DIAGNOSIS — Z992 Dependence on renal dialysis: Secondary | ICD-10-CM | POA: Diagnosis not present

## 2018-03-18 DIAGNOSIS — N186 End stage renal disease: Secondary | ICD-10-CM | POA: Diagnosis not present

## 2018-03-18 DIAGNOSIS — D631 Anemia in chronic kidney disease: Secondary | ICD-10-CM | POA: Diagnosis not present

## 2018-03-18 DIAGNOSIS — D509 Iron deficiency anemia, unspecified: Secondary | ICD-10-CM | POA: Diagnosis not present

## 2018-03-18 DIAGNOSIS — N2581 Secondary hyperparathyroidism of renal origin: Secondary | ICD-10-CM | POA: Diagnosis not present

## 2018-03-20 DIAGNOSIS — N186 End stage renal disease: Secondary | ICD-10-CM | POA: Diagnosis not present

## 2018-03-20 DIAGNOSIS — D509 Iron deficiency anemia, unspecified: Secondary | ICD-10-CM | POA: Diagnosis not present

## 2018-03-20 DIAGNOSIS — Z992 Dependence on renal dialysis: Secondary | ICD-10-CM | POA: Diagnosis not present

## 2018-03-20 DIAGNOSIS — D631 Anemia in chronic kidney disease: Secondary | ICD-10-CM | POA: Diagnosis not present

## 2018-03-20 DIAGNOSIS — N2581 Secondary hyperparathyroidism of renal origin: Secondary | ICD-10-CM | POA: Diagnosis not present

## 2018-03-23 DIAGNOSIS — D509 Iron deficiency anemia, unspecified: Secondary | ICD-10-CM | POA: Diagnosis not present

## 2018-03-23 DIAGNOSIS — N186 End stage renal disease: Secondary | ICD-10-CM | POA: Diagnosis not present

## 2018-03-23 DIAGNOSIS — D631 Anemia in chronic kidney disease: Secondary | ICD-10-CM | POA: Diagnosis not present

## 2018-03-23 DIAGNOSIS — Z992 Dependence on renal dialysis: Secondary | ICD-10-CM | POA: Diagnosis not present

## 2018-03-23 DIAGNOSIS — N2581 Secondary hyperparathyroidism of renal origin: Secondary | ICD-10-CM | POA: Diagnosis not present

## 2018-03-25 DIAGNOSIS — D631 Anemia in chronic kidney disease: Secondary | ICD-10-CM | POA: Diagnosis not present

## 2018-03-25 DIAGNOSIS — D509 Iron deficiency anemia, unspecified: Secondary | ICD-10-CM | POA: Diagnosis not present

## 2018-03-25 DIAGNOSIS — N186 End stage renal disease: Secondary | ICD-10-CM | POA: Diagnosis not present

## 2018-03-25 DIAGNOSIS — Z992 Dependence on renal dialysis: Secondary | ICD-10-CM | POA: Diagnosis not present

## 2018-03-25 DIAGNOSIS — N2581 Secondary hyperparathyroidism of renal origin: Secondary | ICD-10-CM | POA: Diagnosis not present

## 2018-03-27 DIAGNOSIS — Z992 Dependence on renal dialysis: Secondary | ICD-10-CM | POA: Diagnosis not present

## 2018-03-27 DIAGNOSIS — D631 Anemia in chronic kidney disease: Secondary | ICD-10-CM | POA: Diagnosis not present

## 2018-03-27 DIAGNOSIS — N186 End stage renal disease: Secondary | ICD-10-CM | POA: Diagnosis not present

## 2018-03-27 DIAGNOSIS — N2581 Secondary hyperparathyroidism of renal origin: Secondary | ICD-10-CM | POA: Diagnosis not present

## 2018-03-27 DIAGNOSIS — D509 Iron deficiency anemia, unspecified: Secondary | ICD-10-CM | POA: Diagnosis not present

## 2018-03-28 DIAGNOSIS — N186 End stage renal disease: Secondary | ICD-10-CM | POA: Diagnosis not present

## 2018-03-28 DIAGNOSIS — Z992 Dependence on renal dialysis: Secondary | ICD-10-CM | POA: Diagnosis not present

## 2018-03-30 DIAGNOSIS — N186 End stage renal disease: Secondary | ICD-10-CM | POA: Diagnosis not present

## 2018-03-30 DIAGNOSIS — Z992 Dependence on renal dialysis: Secondary | ICD-10-CM | POA: Diagnosis not present

## 2018-03-30 DIAGNOSIS — N2581 Secondary hyperparathyroidism of renal origin: Secondary | ICD-10-CM | POA: Diagnosis not present

## 2018-03-30 DIAGNOSIS — D631 Anemia in chronic kidney disease: Secondary | ICD-10-CM | POA: Diagnosis not present

## 2018-03-30 DIAGNOSIS — D509 Iron deficiency anemia, unspecified: Secondary | ICD-10-CM | POA: Diagnosis not present

## 2018-04-01 DIAGNOSIS — D631 Anemia in chronic kidney disease: Secondary | ICD-10-CM | POA: Diagnosis not present

## 2018-04-01 DIAGNOSIS — D509 Iron deficiency anemia, unspecified: Secondary | ICD-10-CM | POA: Diagnosis not present

## 2018-04-01 DIAGNOSIS — N2581 Secondary hyperparathyroidism of renal origin: Secondary | ICD-10-CM | POA: Diagnosis not present

## 2018-04-01 DIAGNOSIS — Z992 Dependence on renal dialysis: Secondary | ICD-10-CM | POA: Diagnosis not present

## 2018-04-01 DIAGNOSIS — N186 End stage renal disease: Secondary | ICD-10-CM | POA: Diagnosis not present

## 2018-04-03 DIAGNOSIS — D631 Anemia in chronic kidney disease: Secondary | ICD-10-CM | POA: Diagnosis not present

## 2018-04-03 DIAGNOSIS — N186 End stage renal disease: Secondary | ICD-10-CM | POA: Diagnosis not present

## 2018-04-03 DIAGNOSIS — D509 Iron deficiency anemia, unspecified: Secondary | ICD-10-CM | POA: Diagnosis not present

## 2018-04-03 DIAGNOSIS — Z992 Dependence on renal dialysis: Secondary | ICD-10-CM | POA: Diagnosis not present

## 2018-04-03 DIAGNOSIS — N2581 Secondary hyperparathyroidism of renal origin: Secondary | ICD-10-CM | POA: Diagnosis not present

## 2018-04-06 DIAGNOSIS — N186 End stage renal disease: Secondary | ICD-10-CM | POA: Diagnosis not present

## 2018-04-06 DIAGNOSIS — D631 Anemia in chronic kidney disease: Secondary | ICD-10-CM | POA: Diagnosis not present

## 2018-04-06 DIAGNOSIS — D509 Iron deficiency anemia, unspecified: Secondary | ICD-10-CM | POA: Diagnosis not present

## 2018-04-06 DIAGNOSIS — N2581 Secondary hyperparathyroidism of renal origin: Secondary | ICD-10-CM | POA: Diagnosis not present

## 2018-04-06 DIAGNOSIS — Z992 Dependence on renal dialysis: Secondary | ICD-10-CM | POA: Diagnosis not present

## 2018-04-08 DIAGNOSIS — Z992 Dependence on renal dialysis: Secondary | ICD-10-CM | POA: Diagnosis not present

## 2018-04-08 DIAGNOSIS — N2581 Secondary hyperparathyroidism of renal origin: Secondary | ICD-10-CM | POA: Diagnosis not present

## 2018-04-08 DIAGNOSIS — D509 Iron deficiency anemia, unspecified: Secondary | ICD-10-CM | POA: Diagnosis not present

## 2018-04-08 DIAGNOSIS — D631 Anemia in chronic kidney disease: Secondary | ICD-10-CM | POA: Diagnosis not present

## 2018-04-08 DIAGNOSIS — N186 End stage renal disease: Secondary | ICD-10-CM | POA: Diagnosis not present

## 2018-04-10 DIAGNOSIS — N2581 Secondary hyperparathyroidism of renal origin: Secondary | ICD-10-CM | POA: Diagnosis not present

## 2018-04-10 DIAGNOSIS — N186 End stage renal disease: Secondary | ICD-10-CM | POA: Diagnosis not present

## 2018-04-10 DIAGNOSIS — D509 Iron deficiency anemia, unspecified: Secondary | ICD-10-CM | POA: Diagnosis not present

## 2018-04-10 DIAGNOSIS — Z992 Dependence on renal dialysis: Secondary | ICD-10-CM | POA: Diagnosis not present

## 2018-04-10 DIAGNOSIS — D631 Anemia in chronic kidney disease: Secondary | ICD-10-CM | POA: Diagnosis not present

## 2018-04-13 DIAGNOSIS — D631 Anemia in chronic kidney disease: Secondary | ICD-10-CM | POA: Diagnosis not present

## 2018-04-13 DIAGNOSIS — N2581 Secondary hyperparathyroidism of renal origin: Secondary | ICD-10-CM | POA: Diagnosis not present

## 2018-04-13 DIAGNOSIS — Z992 Dependence on renal dialysis: Secondary | ICD-10-CM | POA: Diagnosis not present

## 2018-04-13 DIAGNOSIS — D509 Iron deficiency anemia, unspecified: Secondary | ICD-10-CM | POA: Diagnosis not present

## 2018-04-13 DIAGNOSIS — N186 End stage renal disease: Secondary | ICD-10-CM | POA: Diagnosis not present

## 2018-04-15 ENCOUNTER — Other Ambulatory Visit: Payer: Self-pay | Admitting: Family Medicine

## 2018-04-15 DIAGNOSIS — Z992 Dependence on renal dialysis: Secondary | ICD-10-CM | POA: Diagnosis not present

## 2018-04-15 DIAGNOSIS — N186 End stage renal disease: Secondary | ICD-10-CM | POA: Diagnosis not present

## 2018-04-15 DIAGNOSIS — N2581 Secondary hyperparathyroidism of renal origin: Secondary | ICD-10-CM | POA: Diagnosis not present

## 2018-04-15 DIAGNOSIS — D631 Anemia in chronic kidney disease: Secondary | ICD-10-CM | POA: Diagnosis not present

## 2018-04-15 DIAGNOSIS — D509 Iron deficiency anemia, unspecified: Secondary | ICD-10-CM | POA: Diagnosis not present

## 2018-04-17 DIAGNOSIS — Z992 Dependence on renal dialysis: Secondary | ICD-10-CM | POA: Diagnosis not present

## 2018-04-17 DIAGNOSIS — D509 Iron deficiency anemia, unspecified: Secondary | ICD-10-CM | POA: Diagnosis not present

## 2018-04-17 DIAGNOSIS — N186 End stage renal disease: Secondary | ICD-10-CM | POA: Diagnosis not present

## 2018-04-17 DIAGNOSIS — D631 Anemia in chronic kidney disease: Secondary | ICD-10-CM | POA: Diagnosis not present

## 2018-04-17 DIAGNOSIS — N2581 Secondary hyperparathyroidism of renal origin: Secondary | ICD-10-CM | POA: Diagnosis not present

## 2018-04-19 DIAGNOSIS — D509 Iron deficiency anemia, unspecified: Secondary | ICD-10-CM | POA: Diagnosis not present

## 2018-04-19 DIAGNOSIS — N2581 Secondary hyperparathyroidism of renal origin: Secondary | ICD-10-CM | POA: Diagnosis not present

## 2018-04-19 DIAGNOSIS — Z992 Dependence on renal dialysis: Secondary | ICD-10-CM | POA: Diagnosis not present

## 2018-04-19 DIAGNOSIS — D631 Anemia in chronic kidney disease: Secondary | ICD-10-CM | POA: Diagnosis not present

## 2018-04-19 DIAGNOSIS — N186 End stage renal disease: Secondary | ICD-10-CM | POA: Diagnosis not present

## 2018-04-21 DIAGNOSIS — D509 Iron deficiency anemia, unspecified: Secondary | ICD-10-CM | POA: Diagnosis not present

## 2018-04-21 DIAGNOSIS — D631 Anemia in chronic kidney disease: Secondary | ICD-10-CM | POA: Diagnosis not present

## 2018-04-21 DIAGNOSIS — Z992 Dependence on renal dialysis: Secondary | ICD-10-CM | POA: Diagnosis not present

## 2018-04-21 DIAGNOSIS — N186 End stage renal disease: Secondary | ICD-10-CM | POA: Diagnosis not present

## 2018-04-21 DIAGNOSIS — N2581 Secondary hyperparathyroidism of renal origin: Secondary | ICD-10-CM | POA: Diagnosis not present

## 2018-04-24 DIAGNOSIS — Z794 Long term (current) use of insulin: Secondary | ICD-10-CM | POA: Diagnosis not present

## 2018-04-24 DIAGNOSIS — N186 End stage renal disease: Secondary | ICD-10-CM | POA: Diagnosis not present

## 2018-04-24 DIAGNOSIS — Z992 Dependence on renal dialysis: Secondary | ICD-10-CM | POA: Diagnosis not present

## 2018-04-24 DIAGNOSIS — D631 Anemia in chronic kidney disease: Secondary | ICD-10-CM | POA: Diagnosis not present

## 2018-04-24 DIAGNOSIS — D509 Iron deficiency anemia, unspecified: Secondary | ICD-10-CM | POA: Diagnosis not present

## 2018-04-24 DIAGNOSIS — N2581 Secondary hyperparathyroidism of renal origin: Secondary | ICD-10-CM | POA: Diagnosis not present

## 2018-04-24 DIAGNOSIS — E119 Type 2 diabetes mellitus without complications: Secondary | ICD-10-CM | POA: Diagnosis not present

## 2018-04-27 DIAGNOSIS — N2581 Secondary hyperparathyroidism of renal origin: Secondary | ICD-10-CM | POA: Diagnosis not present

## 2018-04-27 DIAGNOSIS — D631 Anemia in chronic kidney disease: Secondary | ICD-10-CM | POA: Diagnosis not present

## 2018-04-27 DIAGNOSIS — N186 End stage renal disease: Secondary | ICD-10-CM | POA: Diagnosis not present

## 2018-04-27 DIAGNOSIS — D509 Iron deficiency anemia, unspecified: Secondary | ICD-10-CM | POA: Diagnosis not present

## 2018-04-27 DIAGNOSIS — Z992 Dependence on renal dialysis: Secondary | ICD-10-CM | POA: Diagnosis not present

## 2018-04-28 DIAGNOSIS — Z992 Dependence on renal dialysis: Secondary | ICD-10-CM | POA: Diagnosis not present

## 2018-04-28 DIAGNOSIS — N186 End stage renal disease: Secondary | ICD-10-CM | POA: Diagnosis not present

## 2018-04-29 DIAGNOSIS — D509 Iron deficiency anemia, unspecified: Secondary | ICD-10-CM | POA: Diagnosis not present

## 2018-04-29 DIAGNOSIS — N2581 Secondary hyperparathyroidism of renal origin: Secondary | ICD-10-CM | POA: Diagnosis not present

## 2018-04-29 DIAGNOSIS — N186 End stage renal disease: Secondary | ICD-10-CM | POA: Diagnosis not present

## 2018-04-29 DIAGNOSIS — D631 Anemia in chronic kidney disease: Secondary | ICD-10-CM | POA: Diagnosis not present

## 2018-04-29 DIAGNOSIS — Z992 Dependence on renal dialysis: Secondary | ICD-10-CM | POA: Diagnosis not present

## 2018-05-01 DIAGNOSIS — Z992 Dependence on renal dialysis: Secondary | ICD-10-CM | POA: Diagnosis not present

## 2018-05-01 DIAGNOSIS — N2581 Secondary hyperparathyroidism of renal origin: Secondary | ICD-10-CM | POA: Diagnosis not present

## 2018-05-01 DIAGNOSIS — N186 End stage renal disease: Secondary | ICD-10-CM | POA: Diagnosis not present

## 2018-05-01 DIAGNOSIS — D631 Anemia in chronic kidney disease: Secondary | ICD-10-CM | POA: Diagnosis not present

## 2018-05-01 DIAGNOSIS — D509 Iron deficiency anemia, unspecified: Secondary | ICD-10-CM | POA: Diagnosis not present

## 2018-05-04 DIAGNOSIS — D631 Anemia in chronic kidney disease: Secondary | ICD-10-CM | POA: Diagnosis not present

## 2018-05-04 DIAGNOSIS — Z992 Dependence on renal dialysis: Secondary | ICD-10-CM | POA: Diagnosis not present

## 2018-05-04 DIAGNOSIS — D509 Iron deficiency anemia, unspecified: Secondary | ICD-10-CM | POA: Diagnosis not present

## 2018-05-04 DIAGNOSIS — N186 End stage renal disease: Secondary | ICD-10-CM | POA: Diagnosis not present

## 2018-05-04 DIAGNOSIS — N2581 Secondary hyperparathyroidism of renal origin: Secondary | ICD-10-CM | POA: Diagnosis not present

## 2018-05-06 DIAGNOSIS — N2581 Secondary hyperparathyroidism of renal origin: Secondary | ICD-10-CM | POA: Diagnosis not present

## 2018-05-06 DIAGNOSIS — D631 Anemia in chronic kidney disease: Secondary | ICD-10-CM | POA: Diagnosis not present

## 2018-05-06 DIAGNOSIS — N186 End stage renal disease: Secondary | ICD-10-CM | POA: Diagnosis not present

## 2018-05-06 DIAGNOSIS — Z992 Dependence on renal dialysis: Secondary | ICD-10-CM | POA: Diagnosis not present

## 2018-05-06 DIAGNOSIS — D509 Iron deficiency anemia, unspecified: Secondary | ICD-10-CM | POA: Diagnosis not present

## 2018-05-08 DIAGNOSIS — N186 End stage renal disease: Secondary | ICD-10-CM | POA: Diagnosis not present

## 2018-05-08 DIAGNOSIS — D631 Anemia in chronic kidney disease: Secondary | ICD-10-CM | POA: Diagnosis not present

## 2018-05-08 DIAGNOSIS — N2581 Secondary hyperparathyroidism of renal origin: Secondary | ICD-10-CM | POA: Diagnosis not present

## 2018-05-08 DIAGNOSIS — Z992 Dependence on renal dialysis: Secondary | ICD-10-CM | POA: Diagnosis not present

## 2018-05-08 DIAGNOSIS — D509 Iron deficiency anemia, unspecified: Secondary | ICD-10-CM | POA: Diagnosis not present

## 2018-05-11 DIAGNOSIS — Z992 Dependence on renal dialysis: Secondary | ICD-10-CM | POA: Diagnosis not present

## 2018-05-11 DIAGNOSIS — N2581 Secondary hyperparathyroidism of renal origin: Secondary | ICD-10-CM | POA: Diagnosis not present

## 2018-05-11 DIAGNOSIS — D631 Anemia in chronic kidney disease: Secondary | ICD-10-CM | POA: Diagnosis not present

## 2018-05-11 DIAGNOSIS — N186 End stage renal disease: Secondary | ICD-10-CM | POA: Diagnosis not present

## 2018-05-11 DIAGNOSIS — D509 Iron deficiency anemia, unspecified: Secondary | ICD-10-CM | POA: Diagnosis not present

## 2018-05-13 DIAGNOSIS — N2581 Secondary hyperparathyroidism of renal origin: Secondary | ICD-10-CM | POA: Diagnosis not present

## 2018-05-13 DIAGNOSIS — D509 Iron deficiency anemia, unspecified: Secondary | ICD-10-CM | POA: Diagnosis not present

## 2018-05-13 DIAGNOSIS — D631 Anemia in chronic kidney disease: Secondary | ICD-10-CM | POA: Diagnosis not present

## 2018-05-13 DIAGNOSIS — Z992 Dependence on renal dialysis: Secondary | ICD-10-CM | POA: Diagnosis not present

## 2018-05-13 DIAGNOSIS — N186 End stage renal disease: Secondary | ICD-10-CM | POA: Diagnosis not present

## 2018-05-15 DIAGNOSIS — D509 Iron deficiency anemia, unspecified: Secondary | ICD-10-CM | POA: Diagnosis not present

## 2018-05-15 DIAGNOSIS — N2581 Secondary hyperparathyroidism of renal origin: Secondary | ICD-10-CM | POA: Diagnosis not present

## 2018-05-15 DIAGNOSIS — Z992 Dependence on renal dialysis: Secondary | ICD-10-CM | POA: Diagnosis not present

## 2018-05-15 DIAGNOSIS — N186 End stage renal disease: Secondary | ICD-10-CM | POA: Diagnosis not present

## 2018-05-15 DIAGNOSIS — D631 Anemia in chronic kidney disease: Secondary | ICD-10-CM | POA: Diagnosis not present

## 2018-05-18 DIAGNOSIS — N186 End stage renal disease: Secondary | ICD-10-CM | POA: Diagnosis not present

## 2018-05-18 DIAGNOSIS — Z992 Dependence on renal dialysis: Secondary | ICD-10-CM | POA: Diagnosis not present

## 2018-05-18 DIAGNOSIS — D509 Iron deficiency anemia, unspecified: Secondary | ICD-10-CM | POA: Diagnosis not present

## 2018-05-18 DIAGNOSIS — N2581 Secondary hyperparathyroidism of renal origin: Secondary | ICD-10-CM | POA: Diagnosis not present

## 2018-05-18 DIAGNOSIS — D631 Anemia in chronic kidney disease: Secondary | ICD-10-CM | POA: Diagnosis not present

## 2018-05-20 DIAGNOSIS — Z992 Dependence on renal dialysis: Secondary | ICD-10-CM | POA: Diagnosis not present

## 2018-05-20 DIAGNOSIS — D509 Iron deficiency anemia, unspecified: Secondary | ICD-10-CM | POA: Diagnosis not present

## 2018-05-20 DIAGNOSIS — D631 Anemia in chronic kidney disease: Secondary | ICD-10-CM | POA: Diagnosis not present

## 2018-05-20 DIAGNOSIS — N186 End stage renal disease: Secondary | ICD-10-CM | POA: Diagnosis not present

## 2018-05-20 DIAGNOSIS — N2581 Secondary hyperparathyroidism of renal origin: Secondary | ICD-10-CM | POA: Diagnosis not present

## 2018-05-22 DIAGNOSIS — N186 End stage renal disease: Secondary | ICD-10-CM | POA: Diagnosis not present

## 2018-05-22 DIAGNOSIS — N2581 Secondary hyperparathyroidism of renal origin: Secondary | ICD-10-CM | POA: Diagnosis not present

## 2018-05-22 DIAGNOSIS — D631 Anemia in chronic kidney disease: Secondary | ICD-10-CM | POA: Diagnosis not present

## 2018-05-22 DIAGNOSIS — D509 Iron deficiency anemia, unspecified: Secondary | ICD-10-CM | POA: Diagnosis not present

## 2018-05-22 DIAGNOSIS — Z992 Dependence on renal dialysis: Secondary | ICD-10-CM | POA: Diagnosis not present

## 2018-05-25 DIAGNOSIS — D631 Anemia in chronic kidney disease: Secondary | ICD-10-CM | POA: Diagnosis not present

## 2018-05-25 DIAGNOSIS — N2581 Secondary hyperparathyroidism of renal origin: Secondary | ICD-10-CM | POA: Diagnosis not present

## 2018-05-25 DIAGNOSIS — D509 Iron deficiency anemia, unspecified: Secondary | ICD-10-CM | POA: Diagnosis not present

## 2018-05-25 DIAGNOSIS — N186 End stage renal disease: Secondary | ICD-10-CM | POA: Diagnosis not present

## 2018-05-25 DIAGNOSIS — Z992 Dependence on renal dialysis: Secondary | ICD-10-CM | POA: Diagnosis not present

## 2018-05-26 ENCOUNTER — Ambulatory Visit: Payer: Medicare Other | Admitting: Family Medicine

## 2018-05-27 DIAGNOSIS — D631 Anemia in chronic kidney disease: Secondary | ICD-10-CM | POA: Diagnosis not present

## 2018-05-27 DIAGNOSIS — Z992 Dependence on renal dialysis: Secondary | ICD-10-CM | POA: Diagnosis not present

## 2018-05-27 DIAGNOSIS — D509 Iron deficiency anemia, unspecified: Secondary | ICD-10-CM | POA: Diagnosis not present

## 2018-05-27 DIAGNOSIS — N186 End stage renal disease: Secondary | ICD-10-CM | POA: Diagnosis not present

## 2018-05-27 DIAGNOSIS — N2581 Secondary hyperparathyroidism of renal origin: Secondary | ICD-10-CM | POA: Diagnosis not present

## 2018-05-29 DIAGNOSIS — D509 Iron deficiency anemia, unspecified: Secondary | ICD-10-CM | POA: Diagnosis not present

## 2018-05-29 DIAGNOSIS — D631 Anemia in chronic kidney disease: Secondary | ICD-10-CM | POA: Diagnosis not present

## 2018-05-29 DIAGNOSIS — N2581 Secondary hyperparathyroidism of renal origin: Secondary | ICD-10-CM | POA: Diagnosis not present

## 2018-05-29 DIAGNOSIS — Z992 Dependence on renal dialysis: Secondary | ICD-10-CM | POA: Diagnosis not present

## 2018-05-29 DIAGNOSIS — N186 End stage renal disease: Secondary | ICD-10-CM | POA: Diagnosis not present

## 2018-06-01 DIAGNOSIS — N2581 Secondary hyperparathyroidism of renal origin: Secondary | ICD-10-CM | POA: Diagnosis not present

## 2018-06-01 DIAGNOSIS — D509 Iron deficiency anemia, unspecified: Secondary | ICD-10-CM | POA: Diagnosis not present

## 2018-06-01 DIAGNOSIS — Z992 Dependence on renal dialysis: Secondary | ICD-10-CM | POA: Diagnosis not present

## 2018-06-01 DIAGNOSIS — D631 Anemia in chronic kidney disease: Secondary | ICD-10-CM | POA: Diagnosis not present

## 2018-06-01 DIAGNOSIS — N186 End stage renal disease: Secondary | ICD-10-CM | POA: Diagnosis not present

## 2018-06-03 DIAGNOSIS — D631 Anemia in chronic kidney disease: Secondary | ICD-10-CM | POA: Diagnosis not present

## 2018-06-03 DIAGNOSIS — D509 Iron deficiency anemia, unspecified: Secondary | ICD-10-CM | POA: Diagnosis not present

## 2018-06-03 DIAGNOSIS — N2581 Secondary hyperparathyroidism of renal origin: Secondary | ICD-10-CM | POA: Diagnosis not present

## 2018-06-03 DIAGNOSIS — N186 End stage renal disease: Secondary | ICD-10-CM | POA: Diagnosis not present

## 2018-06-03 DIAGNOSIS — Z992 Dependence on renal dialysis: Secondary | ICD-10-CM | POA: Diagnosis not present

## 2018-06-05 DIAGNOSIS — D631 Anemia in chronic kidney disease: Secondary | ICD-10-CM | POA: Diagnosis not present

## 2018-06-05 DIAGNOSIS — N186 End stage renal disease: Secondary | ICD-10-CM | POA: Diagnosis not present

## 2018-06-05 DIAGNOSIS — Z992 Dependence on renal dialysis: Secondary | ICD-10-CM | POA: Diagnosis not present

## 2018-06-05 DIAGNOSIS — D509 Iron deficiency anemia, unspecified: Secondary | ICD-10-CM | POA: Diagnosis not present

## 2018-06-05 DIAGNOSIS — N2581 Secondary hyperparathyroidism of renal origin: Secondary | ICD-10-CM | POA: Diagnosis not present

## 2018-06-08 DIAGNOSIS — Z992 Dependence on renal dialysis: Secondary | ICD-10-CM | POA: Diagnosis not present

## 2018-06-08 DIAGNOSIS — N2581 Secondary hyperparathyroidism of renal origin: Secondary | ICD-10-CM | POA: Diagnosis not present

## 2018-06-08 DIAGNOSIS — D631 Anemia in chronic kidney disease: Secondary | ICD-10-CM | POA: Diagnosis not present

## 2018-06-08 DIAGNOSIS — D509 Iron deficiency anemia, unspecified: Secondary | ICD-10-CM | POA: Diagnosis not present

## 2018-06-08 DIAGNOSIS — N186 End stage renal disease: Secondary | ICD-10-CM | POA: Diagnosis not present

## 2018-06-10 DIAGNOSIS — D631 Anemia in chronic kidney disease: Secondary | ICD-10-CM | POA: Diagnosis not present

## 2018-06-10 DIAGNOSIS — Z992 Dependence on renal dialysis: Secondary | ICD-10-CM | POA: Diagnosis not present

## 2018-06-10 DIAGNOSIS — N2581 Secondary hyperparathyroidism of renal origin: Secondary | ICD-10-CM | POA: Diagnosis not present

## 2018-06-10 DIAGNOSIS — N186 End stage renal disease: Secondary | ICD-10-CM | POA: Diagnosis not present

## 2018-06-10 DIAGNOSIS — D509 Iron deficiency anemia, unspecified: Secondary | ICD-10-CM | POA: Diagnosis not present

## 2018-06-12 DIAGNOSIS — Z992 Dependence on renal dialysis: Secondary | ICD-10-CM | POA: Diagnosis not present

## 2018-06-12 DIAGNOSIS — D631 Anemia in chronic kidney disease: Secondary | ICD-10-CM | POA: Diagnosis not present

## 2018-06-12 DIAGNOSIS — N186 End stage renal disease: Secondary | ICD-10-CM | POA: Diagnosis not present

## 2018-06-12 DIAGNOSIS — D509 Iron deficiency anemia, unspecified: Secondary | ICD-10-CM | POA: Diagnosis not present

## 2018-06-12 DIAGNOSIS — N2581 Secondary hyperparathyroidism of renal origin: Secondary | ICD-10-CM | POA: Diagnosis not present

## 2018-06-15 DIAGNOSIS — N186 End stage renal disease: Secondary | ICD-10-CM | POA: Diagnosis not present

## 2018-06-15 DIAGNOSIS — D631 Anemia in chronic kidney disease: Secondary | ICD-10-CM | POA: Diagnosis not present

## 2018-06-15 DIAGNOSIS — D509 Iron deficiency anemia, unspecified: Secondary | ICD-10-CM | POA: Diagnosis not present

## 2018-06-15 DIAGNOSIS — Z992 Dependence on renal dialysis: Secondary | ICD-10-CM | POA: Diagnosis not present

## 2018-06-15 DIAGNOSIS — N2581 Secondary hyperparathyroidism of renal origin: Secondary | ICD-10-CM | POA: Diagnosis not present

## 2018-06-17 DIAGNOSIS — D509 Iron deficiency anemia, unspecified: Secondary | ICD-10-CM | POA: Diagnosis not present

## 2018-06-17 DIAGNOSIS — N186 End stage renal disease: Secondary | ICD-10-CM | POA: Diagnosis not present

## 2018-06-17 DIAGNOSIS — Z992 Dependence on renal dialysis: Secondary | ICD-10-CM | POA: Diagnosis not present

## 2018-06-17 DIAGNOSIS — D631 Anemia in chronic kidney disease: Secondary | ICD-10-CM | POA: Diagnosis not present

## 2018-06-17 DIAGNOSIS — N2581 Secondary hyperparathyroidism of renal origin: Secondary | ICD-10-CM | POA: Diagnosis not present

## 2018-06-19 DIAGNOSIS — Z992 Dependence on renal dialysis: Secondary | ICD-10-CM | POA: Diagnosis not present

## 2018-06-19 DIAGNOSIS — N2581 Secondary hyperparathyroidism of renal origin: Secondary | ICD-10-CM | POA: Diagnosis not present

## 2018-06-19 DIAGNOSIS — D509 Iron deficiency anemia, unspecified: Secondary | ICD-10-CM | POA: Diagnosis not present

## 2018-06-19 DIAGNOSIS — D631 Anemia in chronic kidney disease: Secondary | ICD-10-CM | POA: Diagnosis not present

## 2018-06-19 DIAGNOSIS — N186 End stage renal disease: Secondary | ICD-10-CM | POA: Diagnosis not present

## 2018-06-22 DIAGNOSIS — N186 End stage renal disease: Secondary | ICD-10-CM | POA: Diagnosis not present

## 2018-06-22 DIAGNOSIS — Z992 Dependence on renal dialysis: Secondary | ICD-10-CM | POA: Diagnosis not present

## 2018-06-22 DIAGNOSIS — N2581 Secondary hyperparathyroidism of renal origin: Secondary | ICD-10-CM | POA: Diagnosis not present

## 2018-06-22 DIAGNOSIS — D631 Anemia in chronic kidney disease: Secondary | ICD-10-CM | POA: Diagnosis not present

## 2018-06-22 DIAGNOSIS — D509 Iron deficiency anemia, unspecified: Secondary | ICD-10-CM | POA: Diagnosis not present

## 2018-06-23 ENCOUNTER — Ambulatory Visit: Payer: Medicare Other | Admitting: Family Medicine

## 2018-06-24 DIAGNOSIS — N2581 Secondary hyperparathyroidism of renal origin: Secondary | ICD-10-CM | POA: Diagnosis not present

## 2018-06-24 DIAGNOSIS — D509 Iron deficiency anemia, unspecified: Secondary | ICD-10-CM | POA: Diagnosis not present

## 2018-06-24 DIAGNOSIS — N186 End stage renal disease: Secondary | ICD-10-CM | POA: Diagnosis not present

## 2018-06-24 DIAGNOSIS — Z992 Dependence on renal dialysis: Secondary | ICD-10-CM | POA: Diagnosis not present

## 2018-06-24 DIAGNOSIS — D631 Anemia in chronic kidney disease: Secondary | ICD-10-CM | POA: Diagnosis not present

## 2018-06-26 DIAGNOSIS — N186 End stage renal disease: Secondary | ICD-10-CM | POA: Diagnosis not present

## 2018-06-26 DIAGNOSIS — D509 Iron deficiency anemia, unspecified: Secondary | ICD-10-CM | POA: Diagnosis not present

## 2018-06-26 DIAGNOSIS — Z992 Dependence on renal dialysis: Secondary | ICD-10-CM | POA: Diagnosis not present

## 2018-06-26 DIAGNOSIS — D631 Anemia in chronic kidney disease: Secondary | ICD-10-CM | POA: Diagnosis not present

## 2018-06-26 DIAGNOSIS — N2581 Secondary hyperparathyroidism of renal origin: Secondary | ICD-10-CM | POA: Diagnosis not present

## 2018-06-27 DIAGNOSIS — Z992 Dependence on renal dialysis: Secondary | ICD-10-CM | POA: Diagnosis not present

## 2018-06-27 DIAGNOSIS — N186 End stage renal disease: Secondary | ICD-10-CM | POA: Diagnosis not present

## 2018-06-29 DIAGNOSIS — Z992 Dependence on renal dialysis: Secondary | ICD-10-CM | POA: Diagnosis not present

## 2018-06-29 DIAGNOSIS — D509 Iron deficiency anemia, unspecified: Secondary | ICD-10-CM | POA: Diagnosis not present

## 2018-06-29 DIAGNOSIS — D631 Anemia in chronic kidney disease: Secondary | ICD-10-CM | POA: Diagnosis not present

## 2018-06-29 DIAGNOSIS — N186 End stage renal disease: Secondary | ICD-10-CM | POA: Diagnosis not present

## 2018-06-29 DIAGNOSIS — N2581 Secondary hyperparathyroidism of renal origin: Secondary | ICD-10-CM | POA: Diagnosis not present

## 2018-07-01 DIAGNOSIS — N186 End stage renal disease: Secondary | ICD-10-CM | POA: Diagnosis not present

## 2018-07-01 DIAGNOSIS — N2581 Secondary hyperparathyroidism of renal origin: Secondary | ICD-10-CM | POA: Diagnosis not present

## 2018-07-01 DIAGNOSIS — Z992 Dependence on renal dialysis: Secondary | ICD-10-CM | POA: Diagnosis not present

## 2018-07-01 DIAGNOSIS — D631 Anemia in chronic kidney disease: Secondary | ICD-10-CM | POA: Diagnosis not present

## 2018-07-01 DIAGNOSIS — D509 Iron deficiency anemia, unspecified: Secondary | ICD-10-CM | POA: Diagnosis not present

## 2018-07-03 DIAGNOSIS — D631 Anemia in chronic kidney disease: Secondary | ICD-10-CM | POA: Diagnosis not present

## 2018-07-03 DIAGNOSIS — D509 Iron deficiency anemia, unspecified: Secondary | ICD-10-CM | POA: Diagnosis not present

## 2018-07-03 DIAGNOSIS — N2581 Secondary hyperparathyroidism of renal origin: Secondary | ICD-10-CM | POA: Diagnosis not present

## 2018-07-03 DIAGNOSIS — N186 End stage renal disease: Secondary | ICD-10-CM | POA: Diagnosis not present

## 2018-07-03 DIAGNOSIS — Z992 Dependence on renal dialysis: Secondary | ICD-10-CM | POA: Diagnosis not present

## 2018-07-06 DIAGNOSIS — Z992 Dependence on renal dialysis: Secondary | ICD-10-CM | POA: Diagnosis not present

## 2018-07-06 DIAGNOSIS — N186 End stage renal disease: Secondary | ICD-10-CM | POA: Diagnosis not present

## 2018-07-06 DIAGNOSIS — D631 Anemia in chronic kidney disease: Secondary | ICD-10-CM | POA: Diagnosis not present

## 2018-07-06 DIAGNOSIS — D509 Iron deficiency anemia, unspecified: Secondary | ICD-10-CM | POA: Diagnosis not present

## 2018-07-06 DIAGNOSIS — N2581 Secondary hyperparathyroidism of renal origin: Secondary | ICD-10-CM | POA: Diagnosis not present

## 2018-07-08 DIAGNOSIS — Z992 Dependence on renal dialysis: Secondary | ICD-10-CM | POA: Diagnosis not present

## 2018-07-08 DIAGNOSIS — N186 End stage renal disease: Secondary | ICD-10-CM | POA: Diagnosis not present

## 2018-07-08 DIAGNOSIS — D509 Iron deficiency anemia, unspecified: Secondary | ICD-10-CM | POA: Diagnosis not present

## 2018-07-08 DIAGNOSIS — N2581 Secondary hyperparathyroidism of renal origin: Secondary | ICD-10-CM | POA: Diagnosis not present

## 2018-07-08 DIAGNOSIS — D631 Anemia in chronic kidney disease: Secondary | ICD-10-CM | POA: Diagnosis not present

## 2018-07-10 DIAGNOSIS — D509 Iron deficiency anemia, unspecified: Secondary | ICD-10-CM | POA: Diagnosis not present

## 2018-07-10 DIAGNOSIS — N2581 Secondary hyperparathyroidism of renal origin: Secondary | ICD-10-CM | POA: Diagnosis not present

## 2018-07-10 DIAGNOSIS — N186 End stage renal disease: Secondary | ICD-10-CM | POA: Diagnosis not present

## 2018-07-10 DIAGNOSIS — Z992 Dependence on renal dialysis: Secondary | ICD-10-CM | POA: Diagnosis not present

## 2018-07-10 DIAGNOSIS — D631 Anemia in chronic kidney disease: Secondary | ICD-10-CM | POA: Diagnosis not present

## 2018-07-13 DIAGNOSIS — N186 End stage renal disease: Secondary | ICD-10-CM | POA: Diagnosis not present

## 2018-07-13 DIAGNOSIS — D631 Anemia in chronic kidney disease: Secondary | ICD-10-CM | POA: Diagnosis not present

## 2018-07-13 DIAGNOSIS — Z992 Dependence on renal dialysis: Secondary | ICD-10-CM | POA: Diagnosis not present

## 2018-07-13 DIAGNOSIS — D509 Iron deficiency anemia, unspecified: Secondary | ICD-10-CM | POA: Diagnosis not present

## 2018-07-13 DIAGNOSIS — N2581 Secondary hyperparathyroidism of renal origin: Secondary | ICD-10-CM | POA: Diagnosis not present

## 2018-07-14 ENCOUNTER — Encounter: Payer: Self-pay | Admitting: Family Medicine

## 2018-07-14 ENCOUNTER — Other Ambulatory Visit: Payer: Self-pay

## 2018-07-14 ENCOUNTER — Ambulatory Visit (INDEPENDENT_AMBULATORY_CARE_PROVIDER_SITE_OTHER): Payer: Medicare Other | Admitting: Family Medicine

## 2018-07-14 VITALS — BP 165/78 | HR 79 | Temp 97.4°F | Ht 64.0 in | Wt 263.4 lb

## 2018-07-14 DIAGNOSIS — E1169 Type 2 diabetes mellitus with other specified complication: Secondary | ICD-10-CM | POA: Diagnosis not present

## 2018-07-14 DIAGNOSIS — Z992 Dependence on renal dialysis: Secondary | ICD-10-CM | POA: Diagnosis not present

## 2018-07-14 DIAGNOSIS — E1165 Type 2 diabetes mellitus with hyperglycemia: Secondary | ICD-10-CM | POA: Diagnosis not present

## 2018-07-14 DIAGNOSIS — N186 End stage renal disease: Secondary | ICD-10-CM | POA: Diagnosis not present

## 2018-07-14 DIAGNOSIS — E1122 Type 2 diabetes mellitus with diabetic chronic kidney disease: Secondary | ICD-10-CM | POA: Diagnosis not present

## 2018-07-14 DIAGNOSIS — IMO0002 Reserved for concepts with insufficient information to code with codable children: Secondary | ICD-10-CM

## 2018-07-14 DIAGNOSIS — N185 Chronic kidney disease, stage 5: Secondary | ICD-10-CM | POA: Diagnosis not present

## 2018-07-14 DIAGNOSIS — E785 Hyperlipidemia, unspecified: Secondary | ICD-10-CM

## 2018-07-14 LAB — BAYER DCA HB A1C WAIVED: HB A1C (BAYER DCA - WAIVED): 8.2 % — ABNORMAL HIGH (ref <7.0)

## 2018-07-14 MED ORDER — DULAGLUTIDE 0.75 MG/0.5ML ~~LOC~~ SOAJ: 0.5000 mL | SUBCUTANEOUS | 2 refills | Status: DC

## 2018-07-14 MED ORDER — METOPROLOL SUCCINATE ER 100 MG PO TB24: 100.0000 mg | ORAL_TABLET | Freq: Every day | ORAL | 1 refills | Status: DC

## 2018-07-14 MED ORDER — ATORVASTATIN CALCIUM 40 MG PO TABS: 40.0000 mg | ORAL_TABLET | Freq: Every day | ORAL | 3 refills | Status: DC

## 2018-07-14 MED ORDER — INSULIN GLARGINE (1 UNIT DIAL) 300 UNIT/ML ~~LOC~~ SOPN: 80.0000 [IU] | PEN_INJECTOR | Freq: Every day | SUBCUTANEOUS | 1 refills | Status: DC

## 2018-07-14 NOTE — Progress Notes (Signed)
Subjective:  Patient ID: Christy Zhang, female    DOB: 09-02-1955  Age: 63 y.o. MRN: [id number]  CC: Medical Management of Chronic Issues   HPI Christy Zhang presents forFollow-up of diabetes. Not checking glucose. Patient denies symptoms such as polyuria, polydipsia, excessive hunger, nausea No significant hypoglycemic spells noted. Medications reviewed. Pt reports taking toujeo every day, but doesn't remember the trulicity. Checking feet daily. Last eye appt was 6 mos ago. Had retinopathy. Was referred to [facility] but did not go.  Pt. Husband died a few months ago. Grieving still.   History Christy Zhang has a past medical history of Anemia, Arthritis, Asthma, CHF (congestive heart failure) ([place]), Chronic kidney disease, Diabetes mellitus with complication ([place]), End stage renal disease ([place]), End stage renal disease on dialysis [facility]), Hypertension, and Neuromuscular disorder ([place]).   She has a past surgical history that includes Insertion of dialysis catheter; Tubal ligation; AV fistula placement (Right, 08/25/2014); Fistula superficialization (Right, 11/03/2014); Ligation of competing branches of arteriovenous fistula (Right, 11/03/2014); Fistulogram (Right, 01/16/2016); A/V Fistulagram (Right, 02/04/2017); and PERIPHERAL VASCULAR BALLOON ANGIOPLASTY (Right, 02/04/2017).   Her family history includes Cancer in her father; Diabetes in her daughter, father, mother, and son; Heart disease in her daughter and son; Hypertension in her daughter, mother, sister, and son; Peripheral vascular disease in her son.She reports that she quit smoking about 33 years ago. Her smoking use included cigarettes. She has never used smokeless tobacco. She reports that she does not drink alcohol or use drugs.  Current Outpatient Medications on File Prior to Visit  Medication Sig Dispense Refill  . acetaminophen (TYLENOL) 500 MG tablet Take 500 mg by mouth every 8 (eight) hours as needed for mild pain.    . Blood  Glucose Monitoring Suppl (BLOOD GLUCOSE MONITOR SYSTEM) w/Device KIT Use to check Blood Sugar up to 4 x daily as directed. Dispense based on pt insurance. E11.9 1 each 0  . diphenhydrAMINE (BENADRYL) 25 mg capsule Take 25 mg by mouth every 8 (eight) hours as needed for itching.    Marland Kitchen glucose blood test strip Use to test blood sugars QID 100 each 12  . Lancets Misc. KIT 1 each by Does not apply route 4 (four) times daily as needed. 120 each 11  . sevelamer carbonate (RENVELA) 800 MG tablet Take 800 mg by mouth 3 (three) times daily with meals. Takes 3 pills with meals, takes 2 pills with snacks    . amLODipine (NORVASC) 5 MG tablet Take 5 mg by mouth daily.     No current facility-administered medications on file prior to visit.     ROS Review of Systems  Constitutional: Negative.   HENT: Negative for congestion.   Eyes: Negative for visual disturbance.  Respiratory: Negative for shortness of breath.   Cardiovascular: Negative for chest pain.  Gastrointestinal: Negative for abdominal pain, constipation, diarrhea, nausea and vomiting.  Genitourinary: Negative for difficulty urinating.  Musculoskeletal: Negative for arthralgias and myalgias.  Neurological: Negative for headaches.  Psychiatric/Behavioral: Negative for sleep disturbance.    Objective:  BP (!) 165/78   Pulse 79   Temp (!) 97.4 F (36.3 C) (Oral)   Ht 5' 4"  (1.626 m)   Wt 263 lb 6 oz (119.5 kg)   BMI 45.21 kg/m   BP Readings from Last 3 Encounters:  07/14/18 (!) 165/78  10/14/17 (!) 165/81  09/11/17 (!) 175/78    Wt Readings from Last 3 Encounters:  07/14/18 263 lb 6 oz (119.5 kg)  10/14/17 262 lb 6  oz (119 kg)  09/11/17 263 lb (119.3 kg)     Physical Exam Constitutional:      General: She is not in acute distress.    Appearance: She is well-developed. She is obese.  HENT:     Head: Normocephalic and atraumatic.     Right Ear: External ear normal.     Left Ear: External ear normal.     Nose: Nose  normal.  Eyes:     Conjunctiva/sclera: Conjunctivae normal.     Pupils: Pupils are equal, round, and reactive to light.  Neck:     Musculoskeletal: Normal range of motion and neck supple.     Thyroid: No thyromegaly.  Cardiovascular:     Rate and Rhythm: Normal rate and regular rhythm.     Heart sounds: Normal heart sounds. No murmur.  Pulmonary:     Effort: Pulmonary effort is normal. No respiratory distress.     Breath sounds: Normal breath sounds. No wheezing or rales.  Abdominal:     General: Bowel sounds are normal. There is no distension.     Palpations: Abdomen is soft.     Tenderness: There is no abdominal tenderness.  Musculoskeletal:        General: No swelling or tenderness.  Lymphadenopathy:     Cervical: No cervical adenopathy.  Skin:    General: Skin is warm and dry.  Neurological:     Mental Status: She is alert and oriented to person, place, and time.     Deep Tendon Reflexes: Reflexes are normal and symmetric.  Psychiatric:        Behavior: Behavior normal.        Thought Content: Thought content normal.       Assessment & Plan:   Christy Zhang was seen today for medical management of chronic issues.  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 -     Dulaglutide (TRULICITY) 8.18 EX/9.3ZJ SOPN; Inject 0.5 mLs into the skin once a week. -     metoprolol succinate (TOPROL-XL) 100 MG 24 hr tablet; Take 1 tablet (100 mg total) by mouth daily. Take with or immediately following a meal. -     Insulin Glargine, 1 Unit Dial, (TOUJEO SOLOSTAR) 300 UNIT/ML SOPN; Inject 80 Units  into the skin daily.      I have changed Christy Zhang's Toujeo SoloStar to Insulin Glargine (1 Unit Dial). I am also having her maintain her diphenhydrAMINE, acetaminophen, sevelamer carbonate, Blood Glucose Monitor System, Lancets Misc., glucose blood, amLODipine, atorvastatin, Dulaglutide, and metoprolol succinate.  Meds ordered this encounter  Medications  . atorvastatin (LIPITOR) 40 MG tablet    Sig: Take 1 tablet (40 mg total) by mouth daily.    Dispense:  90 tablet    Refill:  3  . Dulaglutide (TRULICITY) 6.96 VE/9.3YB SOPN    Sig: Inject 0.5 mLs into the skin once a week.    Dispense:  4 pen    Refill:  2  . metoprolol succinate (TOPROL-XL) 100 MG 24 hr tablet  Sig: Take 1 tablet (100 mg total) by mouth daily. Take with or immediately following a meal.    Dispense:  90 tablet    Refill:  1  . Insulin Glargine, 1 Unit Dial, (TOUJEO SOLOSTAR) 300 UNIT/ML SOPN    Sig: Inject 80 Units into the skin daily.    Dispense:  9 mL    Refill:  1    This prescription was filled on 04/15/2018. Any refills authorized will be placed on file.    Pt. Encouraged to use trulicity on Sunday mornings before church so she doesn't forget. I emphasized the need to follow a low carb diet and get regular exercise. Check glucose twice a day or more. Follow-up: Return in about 6 weeks (around 08/25/2018).  Christy Zhang, M.D.

## 2018-07-15 DIAGNOSIS — N2581 Secondary hyperparathyroidism of renal origin: Secondary | ICD-10-CM | POA: Diagnosis not present

## 2018-07-15 DIAGNOSIS — N186 End stage renal disease: Secondary | ICD-10-CM | POA: Diagnosis not present

## 2018-07-15 DIAGNOSIS — D631 Anemia in chronic kidney disease: Secondary | ICD-10-CM | POA: Diagnosis not present

## 2018-07-15 DIAGNOSIS — D509 Iron deficiency anemia, unspecified: Secondary | ICD-10-CM | POA: Diagnosis not present

## 2018-07-15 DIAGNOSIS — Z992 Dependence on renal dialysis: Secondary | ICD-10-CM | POA: Diagnosis not present

## 2018-07-15 LAB — CMP14+EGFR
ALT: 9 IU/L (ref 0–32)
AST: 9 IU/L (ref 0–40)
Albumin/Globulin Ratio: 1.3 (ref 1.2–2.2)
Albumin: 4.5 g/dL (ref 3.8–4.8)
Alkaline Phosphatase: 76 IU/L (ref 39–117)
BUN/Creatinine Ratio: 4 — ABNORMAL LOW (ref 12–28)
BUN: 21 mg/dL (ref 8–27)
Bilirubin Total: 0.5 mg/dL (ref 0.0–1.2)
CO2: 23 mmol/L (ref 20–29)
Calcium: 9.4 mg/dL (ref 8.7–10.3)
Chloride: 94 mmol/L — ABNORMAL LOW (ref 96–106)
Creatinine, Ser: 5.57 mg/dL (ref 0.57–1.00)
GFR calc Af Amer: 9 mL/min/{1.73_m2} — ABNORMAL LOW (ref >59)
GFR calc non Af Amer: 8 mL/min/{1.73_m2} — ABNORMAL LOW (ref >59)
Globulin, Total: 3.5 g/dL (ref 1.5–4.5)
Glucose: 184 mg/dL — ABNORMAL HIGH (ref 65–99)
Potassium: 4.8 mmol/L (ref 3.5–5.2)
Sodium: 139 mmol/L (ref 134–144)
Total Protein: 8 g/dL (ref 6.0–8.5)

## 2018-07-15 LAB — CBC WITH DIFFERENTIAL/PLATELET
Basophils Absolute: 0.1 10*3/uL (ref 0.0–0.2)
Basos: 1 %
EOS (ABSOLUTE): 0.5 10*3/uL — ABNORMAL HIGH (ref 0.0–0.4)
Eos: 4 %
Hematocrit: 35.9 % (ref 34.0–46.6)
Hemoglobin: 11 g/dL — ABNORMAL LOW (ref 11.1–15.9)
Immature Grans (Abs): 0 10*3/uL (ref 0.0–0.1)
Immature Granulocytes: 0 %
Lymphocytes Absolute: 2 10*3/uL (ref 0.7–3.1)
Lymphs: 17 %
MCH: 27.4 pg (ref 26.6–33.0)
MCHC: 30.6 g/dL — ABNORMAL LOW (ref 31.5–35.7)
MCV: 89 fL (ref 79–97)
Monocytes Absolute: 0.6 10*3/uL (ref 0.1–0.9)
Monocytes: 5 %
Neutrophils Absolute: 8.2 10*3/uL — ABNORMAL HIGH (ref 1.4–7.0)
Neutrophils: 73 %
Platelets: 568 10*3/uL — ABNORMAL HIGH (ref 150–450)
RBC: 4.02 x10E6/uL (ref 3.77–5.28)
RDW: 13.7 % (ref 11.7–15.4)
WBC: 11.4 10*3/uL — ABNORMAL HIGH (ref 3.4–10.8)

## 2018-07-15 LAB — LIPID PANEL
Chol/HDL Ratio: 3.1 ratio (ref 0.0–4.4)
Cholesterol, Total: 146 mg/dL (ref 100–199)
HDL: 47 mg/dL (ref >39)
LDL Calculated: 78 mg/dL (ref 0–99)
Triglycerides: 104 mg/dL (ref 0–149)
VLDL Cholesterol Cal: 21 mg/dL (ref 5–40)

## 2018-07-17 DIAGNOSIS — N2581 Secondary hyperparathyroidism of renal origin: Secondary | ICD-10-CM | POA: Diagnosis not present

## 2018-07-17 DIAGNOSIS — Z992 Dependence on renal dialysis: Secondary | ICD-10-CM | POA: Diagnosis not present

## 2018-07-17 DIAGNOSIS — D631 Anemia in chronic kidney disease: Secondary | ICD-10-CM | POA: Diagnosis not present

## 2018-07-17 DIAGNOSIS — N186 End stage renal disease: Secondary | ICD-10-CM | POA: Diagnosis not present

## 2018-07-17 DIAGNOSIS — D509 Iron deficiency anemia, unspecified: Secondary | ICD-10-CM | POA: Diagnosis not present

## 2018-07-20 DIAGNOSIS — E119 Type 2 diabetes mellitus without complications: Secondary | ICD-10-CM | POA: Diagnosis not present

## 2018-07-20 DIAGNOSIS — Z992 Dependence on renal dialysis: Secondary | ICD-10-CM | POA: Diagnosis not present

## 2018-07-20 DIAGNOSIS — N2581 Secondary hyperparathyroidism of renal origin: Secondary | ICD-10-CM | POA: Diagnosis not present

## 2018-07-20 DIAGNOSIS — Z794 Long term (current) use of insulin: Secondary | ICD-10-CM | POA: Diagnosis not present

## 2018-07-20 DIAGNOSIS — D631 Anemia in chronic kidney disease: Secondary | ICD-10-CM | POA: Diagnosis not present

## 2018-07-20 DIAGNOSIS — D509 Iron deficiency anemia, unspecified: Secondary | ICD-10-CM | POA: Diagnosis not present

## 2018-07-20 DIAGNOSIS — N186 End stage renal disease: Secondary | ICD-10-CM | POA: Diagnosis not present

## 2018-07-22 DIAGNOSIS — D509 Iron deficiency anemia, unspecified: Secondary | ICD-10-CM | POA: Diagnosis not present

## 2018-07-22 DIAGNOSIS — D631 Anemia in chronic kidney disease: Secondary | ICD-10-CM | POA: Diagnosis not present

## 2018-07-22 DIAGNOSIS — N2581 Secondary hyperparathyroidism of renal origin: Secondary | ICD-10-CM | POA: Diagnosis not present

## 2018-07-22 DIAGNOSIS — N186 End stage renal disease: Secondary | ICD-10-CM | POA: Diagnosis not present

## 2018-07-22 DIAGNOSIS — Z992 Dependence on renal dialysis: Secondary | ICD-10-CM | POA: Diagnosis not present

## 2018-07-24 DIAGNOSIS — D631 Anemia in chronic kidney disease: Secondary | ICD-10-CM | POA: Diagnosis not present

## 2018-07-24 DIAGNOSIS — N2581 Secondary hyperparathyroidism of renal origin: Secondary | ICD-10-CM | POA: Diagnosis not present

## 2018-07-24 DIAGNOSIS — D509 Iron deficiency anemia, unspecified: Secondary | ICD-10-CM | POA: Diagnosis not present

## 2018-07-24 DIAGNOSIS — Z992 Dependence on renal dialysis: Secondary | ICD-10-CM | POA: Diagnosis not present

## 2018-07-24 DIAGNOSIS — N186 End stage renal disease: Secondary | ICD-10-CM | POA: Diagnosis not present

## 2018-07-27 DIAGNOSIS — Z992 Dependence on renal dialysis: Secondary | ICD-10-CM | POA: Diagnosis not present

## 2018-07-27 DIAGNOSIS — D631 Anemia in chronic kidney disease: Secondary | ICD-10-CM | POA: Diagnosis not present

## 2018-07-27 DIAGNOSIS — N2581 Secondary hyperparathyroidism of renal origin: Secondary | ICD-10-CM | POA: Diagnosis not present

## 2018-07-27 DIAGNOSIS — N186 End stage renal disease: Secondary | ICD-10-CM | POA: Diagnosis not present

## 2018-07-27 DIAGNOSIS — D509 Iron deficiency anemia, unspecified: Secondary | ICD-10-CM | POA: Diagnosis not present

## 2018-07-28 DIAGNOSIS — Z992 Dependence on renal dialysis: Secondary | ICD-10-CM | POA: Diagnosis not present

## 2018-07-28 DIAGNOSIS — N186 End stage renal disease: Secondary | ICD-10-CM | POA: Diagnosis not present

## 2018-07-29 DIAGNOSIS — N186 End stage renal disease: Secondary | ICD-10-CM | POA: Diagnosis not present

## 2018-07-29 DIAGNOSIS — D631 Anemia in chronic kidney disease: Secondary | ICD-10-CM | POA: Diagnosis not present

## 2018-07-29 DIAGNOSIS — Z992 Dependence on renal dialysis: Secondary | ICD-10-CM | POA: Diagnosis not present

## 2018-07-29 DIAGNOSIS — D509 Iron deficiency anemia, unspecified: Secondary | ICD-10-CM | POA: Diagnosis not present

## 2018-07-29 DIAGNOSIS — N2581 Secondary hyperparathyroidism of renal origin: Secondary | ICD-10-CM | POA: Diagnosis not present

## 2018-07-31 DIAGNOSIS — D631 Anemia in chronic kidney disease: Secondary | ICD-10-CM | POA: Diagnosis not present

## 2018-07-31 DIAGNOSIS — N2581 Secondary hyperparathyroidism of renal origin: Secondary | ICD-10-CM | POA: Diagnosis not present

## 2018-07-31 DIAGNOSIS — D509 Iron deficiency anemia, unspecified: Secondary | ICD-10-CM | POA: Diagnosis not present

## 2018-07-31 DIAGNOSIS — Z992 Dependence on renal dialysis: Secondary | ICD-10-CM | POA: Diagnosis not present

## 2018-07-31 DIAGNOSIS — N186 End stage renal disease: Secondary | ICD-10-CM | POA: Diagnosis not present

## 2018-08-03 DIAGNOSIS — D631 Anemia in chronic kidney disease: Secondary | ICD-10-CM | POA: Diagnosis not present

## 2018-08-03 DIAGNOSIS — N2581 Secondary hyperparathyroidism of renal origin: Secondary | ICD-10-CM | POA: Diagnosis not present

## 2018-08-03 DIAGNOSIS — D509 Iron deficiency anemia, unspecified: Secondary | ICD-10-CM | POA: Diagnosis not present

## 2018-08-03 DIAGNOSIS — N186 End stage renal disease: Secondary | ICD-10-CM | POA: Diagnosis not present

## 2018-08-03 DIAGNOSIS — Z992 Dependence on renal dialysis: Secondary | ICD-10-CM | POA: Diagnosis not present

## 2018-08-05 DIAGNOSIS — N2581 Secondary hyperparathyroidism of renal origin: Secondary | ICD-10-CM | POA: Diagnosis not present

## 2018-08-05 DIAGNOSIS — D509 Iron deficiency anemia, unspecified: Secondary | ICD-10-CM | POA: Diagnosis not present

## 2018-08-05 DIAGNOSIS — Z992 Dependence on renal dialysis: Secondary | ICD-10-CM | POA: Diagnosis not present

## 2018-08-05 DIAGNOSIS — D631 Anemia in chronic kidney disease: Secondary | ICD-10-CM | POA: Diagnosis not present

## 2018-08-05 DIAGNOSIS — N186 End stage renal disease: Secondary | ICD-10-CM | POA: Diagnosis not present

## 2018-08-07 DIAGNOSIS — N2581 Secondary hyperparathyroidism of renal origin: Secondary | ICD-10-CM | POA: Diagnosis not present

## 2018-08-07 DIAGNOSIS — Z992 Dependence on renal dialysis: Secondary | ICD-10-CM | POA: Diagnosis not present

## 2018-08-07 DIAGNOSIS — D631 Anemia in chronic kidney disease: Secondary | ICD-10-CM | POA: Diagnosis not present

## 2018-08-07 DIAGNOSIS — N186 End stage renal disease: Secondary | ICD-10-CM | POA: Diagnosis not present

## 2018-08-07 DIAGNOSIS — D509 Iron deficiency anemia, unspecified: Secondary | ICD-10-CM | POA: Diagnosis not present

## 2018-08-10 DIAGNOSIS — D631 Anemia in chronic kidney disease: Secondary | ICD-10-CM | POA: Diagnosis not present

## 2018-08-10 DIAGNOSIS — N186 End stage renal disease: Secondary | ICD-10-CM | POA: Diagnosis not present

## 2018-08-10 DIAGNOSIS — N2581 Secondary hyperparathyroidism of renal origin: Secondary | ICD-10-CM | POA: Diagnosis not present

## 2018-08-10 DIAGNOSIS — Z992 Dependence on renal dialysis: Secondary | ICD-10-CM | POA: Diagnosis not present

## 2018-08-10 DIAGNOSIS — D509 Iron deficiency anemia, unspecified: Secondary | ICD-10-CM | POA: Diagnosis not present

## 2018-08-12 DIAGNOSIS — N186 End stage renal disease: Secondary | ICD-10-CM | POA: Diagnosis not present

## 2018-08-12 DIAGNOSIS — Z992 Dependence on renal dialysis: Secondary | ICD-10-CM | POA: Diagnosis not present

## 2018-08-12 DIAGNOSIS — D631 Anemia in chronic kidney disease: Secondary | ICD-10-CM | POA: Diagnosis not present

## 2018-08-12 DIAGNOSIS — N2581 Secondary hyperparathyroidism of renal origin: Secondary | ICD-10-CM | POA: Diagnosis not present

## 2018-08-12 DIAGNOSIS — D509 Iron deficiency anemia, unspecified: Secondary | ICD-10-CM | POA: Diagnosis not present

## 2018-08-14 DIAGNOSIS — D509 Iron deficiency anemia, unspecified: Secondary | ICD-10-CM | POA: Diagnosis not present

## 2018-08-14 DIAGNOSIS — Z992 Dependence on renal dialysis: Secondary | ICD-10-CM | POA: Diagnosis not present

## 2018-08-14 DIAGNOSIS — D631 Anemia in chronic kidney disease: Secondary | ICD-10-CM | POA: Diagnosis not present

## 2018-08-14 DIAGNOSIS — N2581 Secondary hyperparathyroidism of renal origin: Secondary | ICD-10-CM | POA: Diagnosis not present

## 2018-08-14 DIAGNOSIS — N186 End stage renal disease: Secondary | ICD-10-CM | POA: Diagnosis not present

## 2018-08-17 DIAGNOSIS — D631 Anemia in chronic kidney disease: Secondary | ICD-10-CM | POA: Diagnosis not present

## 2018-08-17 DIAGNOSIS — Z992 Dependence on renal dialysis: Secondary | ICD-10-CM | POA: Diagnosis not present

## 2018-08-17 DIAGNOSIS — N2581 Secondary hyperparathyroidism of renal origin: Secondary | ICD-10-CM | POA: Diagnosis not present

## 2018-08-17 DIAGNOSIS — N186 End stage renal disease: Secondary | ICD-10-CM | POA: Diagnosis not present

## 2018-08-17 DIAGNOSIS — D509 Iron deficiency anemia, unspecified: Secondary | ICD-10-CM | POA: Diagnosis not present

## 2018-08-19 DIAGNOSIS — N186 End stage renal disease: Secondary | ICD-10-CM | POA: Diagnosis not present

## 2018-08-19 DIAGNOSIS — D509 Iron deficiency anemia, unspecified: Secondary | ICD-10-CM | POA: Diagnosis not present

## 2018-08-19 DIAGNOSIS — D631 Anemia in chronic kidney disease: Secondary | ICD-10-CM | POA: Diagnosis not present

## 2018-08-19 DIAGNOSIS — Z992 Dependence on renal dialysis: Secondary | ICD-10-CM | POA: Diagnosis not present

## 2018-08-19 DIAGNOSIS — N2581 Secondary hyperparathyroidism of renal origin: Secondary | ICD-10-CM | POA: Diagnosis not present

## 2018-08-21 DIAGNOSIS — Z992 Dependence on renal dialysis: Secondary | ICD-10-CM | POA: Diagnosis not present

## 2018-08-21 DIAGNOSIS — D509 Iron deficiency anemia, unspecified: Secondary | ICD-10-CM | POA: Diagnosis not present

## 2018-08-21 DIAGNOSIS — N2581 Secondary hyperparathyroidism of renal origin: Secondary | ICD-10-CM | POA: Diagnosis not present

## 2018-08-21 DIAGNOSIS — N186 End stage renal disease: Secondary | ICD-10-CM | POA: Diagnosis not present

## 2018-08-21 DIAGNOSIS — D631 Anemia in chronic kidney disease: Secondary | ICD-10-CM | POA: Diagnosis not present

## 2018-08-24 ENCOUNTER — Encounter: Payer: Self-pay | Admitting: Family Medicine

## 2018-08-24 ENCOUNTER — Other Ambulatory Visit: Payer: Self-pay

## 2018-08-24 ENCOUNTER — Ambulatory Visit: Payer: Medicare Other | Admitting: Family Medicine

## 2018-08-24 DIAGNOSIS — Z992 Dependence on renal dialysis: Secondary | ICD-10-CM | POA: Diagnosis not present

## 2018-08-24 DIAGNOSIS — N186 End stage renal disease: Secondary | ICD-10-CM | POA: Diagnosis not present

## 2018-08-24 DIAGNOSIS — N2581 Secondary hyperparathyroidism of renal origin: Secondary | ICD-10-CM | POA: Diagnosis not present

## 2018-08-24 DIAGNOSIS — D631 Anemia in chronic kidney disease: Secondary | ICD-10-CM | POA: Diagnosis not present

## 2018-08-24 DIAGNOSIS — D509 Iron deficiency anemia, unspecified: Secondary | ICD-10-CM | POA: Diagnosis not present

## 2018-08-24 NOTE — Progress Notes (Signed)
[facility] visit cancelled as pt. Currently in dialysis. She will reschedule

## 2018-08-26 DIAGNOSIS — Z992 Dependence on renal dialysis: Secondary | ICD-10-CM | POA: Diagnosis not present

## 2018-08-26 DIAGNOSIS — N2581 Secondary hyperparathyroidism of renal origin: Secondary | ICD-10-CM | POA: Diagnosis not present

## 2018-08-26 DIAGNOSIS — D631 Anemia in chronic kidney disease: Secondary | ICD-10-CM | POA: Diagnosis not present

## 2018-08-26 DIAGNOSIS — D509 Iron deficiency anemia, unspecified: Secondary | ICD-10-CM | POA: Diagnosis not present

## 2018-08-26 DIAGNOSIS — N186 End stage renal disease: Secondary | ICD-10-CM | POA: Diagnosis not present

## 2018-08-27 DIAGNOSIS — N186 End stage renal disease: Secondary | ICD-10-CM | POA: Diagnosis not present

## 2018-08-27 DIAGNOSIS — Z992 Dependence on renal dialysis: Secondary | ICD-10-CM | POA: Diagnosis not present

## 2018-08-28 DIAGNOSIS — N2581 Secondary hyperparathyroidism of renal origin: Secondary | ICD-10-CM | POA: Diagnosis not present

## 2018-08-28 DIAGNOSIS — Z992 Dependence on renal dialysis: Secondary | ICD-10-CM | POA: Diagnosis not present

## 2018-08-28 DIAGNOSIS — D509 Iron deficiency anemia, unspecified: Secondary | ICD-10-CM | POA: Diagnosis not present

## 2018-08-28 DIAGNOSIS — D631 Anemia in chronic kidney disease: Secondary | ICD-10-CM | POA: Diagnosis not present

## 2018-08-28 DIAGNOSIS — N186 End stage renal disease: Secondary | ICD-10-CM | POA: Diagnosis not present

## 2018-08-31 DIAGNOSIS — D631 Anemia in chronic kidney disease: Secondary | ICD-10-CM | POA: Diagnosis not present

## 2018-08-31 DIAGNOSIS — N186 End stage renal disease: Secondary | ICD-10-CM | POA: Diagnosis not present

## 2018-08-31 DIAGNOSIS — Z992 Dependence on renal dialysis: Secondary | ICD-10-CM | POA: Diagnosis not present

## 2018-08-31 DIAGNOSIS — D509 Iron deficiency anemia, unspecified: Secondary | ICD-10-CM | POA: Diagnosis not present

## 2018-08-31 DIAGNOSIS — N2581 Secondary hyperparathyroidism of renal origin: Secondary | ICD-10-CM | POA: Diagnosis not present

## 2018-09-02 DIAGNOSIS — D631 Anemia in chronic kidney disease: Secondary | ICD-10-CM | POA: Diagnosis not present

## 2018-09-02 DIAGNOSIS — N186 End stage renal disease: Secondary | ICD-10-CM | POA: Diagnosis not present

## 2018-09-02 DIAGNOSIS — D509 Iron deficiency anemia, unspecified: Secondary | ICD-10-CM | POA: Diagnosis not present

## 2018-09-02 DIAGNOSIS — N2581 Secondary hyperparathyroidism of renal origin: Secondary | ICD-10-CM | POA: Diagnosis not present

## 2018-09-02 DIAGNOSIS — Z992 Dependence on renal dialysis: Secondary | ICD-10-CM | POA: Diagnosis not present

## 2018-09-04 DIAGNOSIS — Z992 Dependence on renal dialysis: Secondary | ICD-10-CM | POA: Diagnosis not present

## 2018-09-04 DIAGNOSIS — N186 End stage renal disease: Secondary | ICD-10-CM | POA: Diagnosis not present

## 2018-09-04 DIAGNOSIS — D509 Iron deficiency anemia, unspecified: Secondary | ICD-10-CM | POA: Diagnosis not present

## 2018-09-04 DIAGNOSIS — N2581 Secondary hyperparathyroidism of renal origin: Secondary | ICD-10-CM | POA: Diagnosis not present

## 2018-09-04 DIAGNOSIS — D631 Anemia in chronic kidney disease: Secondary | ICD-10-CM | POA: Diagnosis not present

## 2018-09-07 DIAGNOSIS — N2581 Secondary hyperparathyroidism of renal origin: Secondary | ICD-10-CM | POA: Diagnosis not present

## 2018-09-07 DIAGNOSIS — N186 End stage renal disease: Secondary | ICD-10-CM | POA: Diagnosis not present

## 2018-09-07 DIAGNOSIS — Z992 Dependence on renal dialysis: Secondary | ICD-10-CM | POA: Diagnosis not present

## 2018-09-07 DIAGNOSIS — D631 Anemia in chronic kidney disease: Secondary | ICD-10-CM | POA: Diagnosis not present

## 2018-09-07 DIAGNOSIS — D509 Iron deficiency anemia, unspecified: Secondary | ICD-10-CM | POA: Diagnosis not present

## 2018-09-08 ENCOUNTER — Ambulatory Visit (INDEPENDENT_AMBULATORY_CARE_PROVIDER_SITE_OTHER): Payer: Medicare Other | Admitting: Family Medicine

## 2018-09-08 ENCOUNTER — Encounter: Payer: Self-pay | Admitting: Family Medicine

## 2018-09-08 ENCOUNTER — Other Ambulatory Visit: Payer: Self-pay

## 2018-09-08 VITALS — BP 159/73 | HR 81 | Temp 97.0°F | Ht 64.0 in | Wt 263.0 lb

## 2018-09-08 DIAGNOSIS — N186 End stage renal disease: Secondary | ICD-10-CM

## 2018-09-08 DIAGNOSIS — I151 Hypertension secondary to other renal disorders: Secondary | ICD-10-CM | POA: Diagnosis not present

## 2018-09-08 DIAGNOSIS — N185 Chronic kidney disease, stage 5: Secondary | ICD-10-CM

## 2018-09-08 DIAGNOSIS — Z992 Dependence on renal dialysis: Secondary | ICD-10-CM

## 2018-09-08 DIAGNOSIS — N2889 Other specified disorders of kidney and ureter: Secondary | ICD-10-CM | POA: Diagnosis not present

## 2018-09-08 DIAGNOSIS — E1122 Type 2 diabetes mellitus with diabetic chronic kidney disease: Secondary | ICD-10-CM

## 2018-09-08 DIAGNOSIS — E1165 Type 2 diabetes mellitus with hyperglycemia: Secondary | ICD-10-CM | POA: Diagnosis not present

## 2018-09-08 DIAGNOSIS — IMO0002 Reserved for concepts with insufficient information to code with codable children: Secondary | ICD-10-CM

## 2018-09-08 MED ORDER — INSULIN GLARGINE (1 UNIT DIAL) 300 UNIT/ML ~~LOC~~ SOPN: 90.0000 [IU] | PEN_INJECTOR | Freq: Every day | SUBCUTANEOUS | 1 refills | Status: DC

## 2018-09-08 MED ORDER — FLUOCINONIDE-E 0.05 % EX CREA: 1.0000 "application " | TOPICAL_CREAM | Freq: Two times a day (BID) | CUTANEOUS | 5 refills | Status: DC

## 2018-09-08 NOTE — Addendum Note (Signed)
Addended by: Claretta Fraise on: 09/08/2018 12:12 PM   Modules accepted: Orders

## 2018-09-08 NOTE — Progress Notes (Addendum)
Subjective:  Patient ID: Christy Zhang,  female    DOB: 1956/01/31  Age: 63 y.o.    CC: Medical Management of Chronic Issues and Rash   HPI Nohemy Koop presents for  follow-up of hypertension. Patient has no history of headache chest pain or shortness of breath or recent cough. Patient also denies symptoms of TIA such as numbness weakness lateralizing.BP regulated through dialysis MWF.  Patient also  in for follow-up of elevated cholesterol. Doing well without complaints on current medication. Denies side effects  including myalgia and arthralgia and nausea. Also in today for liver function testing. Currently no chest pain, shortness of breath or other cardiovascular related symptoms noted.  Follow-up of diabetes. Patient does check blood sugar at home. Readings run between 200 and 300 Patient denies symptoms such as excessive hunger or urinary frequency, excessive hunger, nausea No significant hypoglycemic spells noted. Medications reviewed. Pt reports taking toujeo regularly. Forgets the trulicity still, in spite of discussion - apologetic.  Pt. denies complication/adverse reaction today.   Itching rash on left superior shoulder margin. Present for over 1 week. Doesn't burn.      History Tarita has a past medical history of Anemia, Arthritis, Asthma, CHF (congestive heart failure) ([place]), Chronic kidney disease, Diabetes mellitus with complication ([place]), End stage renal disease ([place]), End stage renal disease on dialysis [facility]), Hypertension, and Neuromuscular disorder ([place]).   She has a past surgical history that includes Insertion of dialysis catheter; Tubal ligation; AV fistula placement (Right, 08/25/2014); Fistula superficialization (Right, 11/03/2014); Ligation of competing branches of arteriovenous fistula (Right, 11/03/2014); Fistulogram (Right, 01/16/2016); A/V Fistulagram (Right, 02/04/2017); and PERIPHERAL VASCULAR BALLOON ANGIOPLASTY (Right, 02/04/2017).   Her family  history includes Cancer in her father; Diabetes in her daughter, father, mother, and son; Heart disease in her daughter and son; Hypertension in her daughter, mother, sister, and son; Peripheral vascular disease in her son.She reports that she quit smoking about 34 years ago. Her smoking use included cigarettes. She has never used smokeless tobacco. She reports that she does not drink alcohol or use drugs.  Current Outpatient Medications on File Prior to Visit  Medication Sig Dispense Refill  . acetaminophen (TYLENOL) 500 MG tablet Take 500 mg by mouth every 8 (eight) hours as needed for mild pain.    Marland Kitchen amLODipine (NORVASC) 5 MG tablet Take 5 mg by mouth daily.    Marland Kitchen atorvastatin (LIPITOR) 40 MG tablet Take 1 tablet (40 mg total) by mouth daily. 90 tablet 3  . Blood Glucose Monitoring Suppl (BLOOD GLUCOSE MONITOR SYSTEM) w/Device KIT Use to check Blood Sugar up to 4 x daily as directed. Dispense based on pt insurance. E11.9 1 each 0  . diphenhydrAMINE (BENADRYL) 25 mg capsule Take 25 mg by mouth every 8 (eight) hours as needed for itching.    . Dulaglutide (TRULICITY) 4.08 XK/4.8JE SOPN Inject 0.5 mLs into the skin once a week. 4 pen 2  . glucose blood test strip Use to test blood sugars QID 100 each 12  . Lancets Misc. KIT 1 each by Does not apply route 4 (four) times daily as needed. 120 each 11  . metoprolol succinate (TOPROL-XL) 100 MG 24 hr tablet Take 1 tablet (100 mg total) by mouth daily. Take with or immediately following a meal. 90 tablet 1  . sevelamer carbonate (RENVELA) 800 MG tablet Take 800 mg by mouth 3 (three) times daily with meals. Takes 3 pills with meals, takes 2 pills with snacks     No  current facility-administered medications on file prior to visit.     ROS Review of Systems  Constitutional: Negative.   HENT: Negative for congestion.   Eyes: Negative for visual disturbance.  Respiratory: Negative for shortness of breath.   Cardiovascular: Negative for chest pain.   Gastrointestinal: Negative for abdominal pain, constipation, diarrhea, nausea and vomiting.  Genitourinary: Negative for difficulty urinating.  Musculoskeletal: Negative for arthralgias and myalgias.  Neurological: Negative for headaches.  Psychiatric/Behavioral: Negative for sleep disturbance.    Objective:  BP (!) 159/73   Pulse 81   Temp (!) 97 F (36.1 C) (Oral)   Ht 5' 4" (1.626 m)   Wt 263 lb (119.3 kg)   BMI 45.14 kg/m   BP Readings from Last 3 Encounters:  09/08/18 (!) 159/73  07/14/18 (!) 165/78  10/14/17 (!) 165/81    Wt Readings from Last 3 Encounters:  09/08/18 263 lb (119.3 kg)  07/14/18 263 lb 6 oz (119.5 kg)  10/14/17 262 lb 6 oz (119 kg)     Physical Exam Constitutional:      General: She is not in acute distress.    Appearance: She is well-developed.  Cardiovascular:     Rate and Rhythm: Normal rate and regular rhythm.  Pulmonary:     Breath sounds: Normal breath sounds.  Skin:    General: Skin is warm and dry.  Neurological:     Mental Status: She is alert and oriented to person, place, and time.         Assessment & Plan:   Ariatna was seen today for medical management of chronic issues and rash.  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 -     Insulin Glargine, 1 Unit Dial, (TOUJEO SOLOSTAR) 300 UNIT/ML SOPN; Inject 90 Units into the skin daily. -     fluocinonide-emollient (LIDEX-E) 0.05 % cream; Apply 1 application topically 2 (two) times daily. To affected areas   I am having [facility] start on fluocinonide-emollient. I am also having her maintain her diphenhydrAMINE, acetaminophen, sevelamer  carbonate, Blood Glucose Monitor System, Lancets Misc., glucose blood, amLODipine, atorvastatin, Dulaglutide, metoprolol succinate, and Insulin Glargine (1 Unit Dial).  Meds ordered this encounter  Medications  . DISCONTD: Insulin Glargine, 1 Unit Dial, (TOUJEO SOLOSTAR) 300 UNIT/ML SOPN    Sig: Inject 90 Units into the skin daily.    Dispense:  9 mL    Refill:  1    This prescription was filled on 04/15/2018. Any refills authorized will be placed on file.  . Insulin Glargine, 1 Unit Dial, (TOUJEO SOLOSTAR) 300 UNIT/ML SOPN    Sig: Inject 90 Units into the skin daily.    Dispense:  9 mL    Refill:  1    This prescription was filled on 04/15/2018. Any refills authorized will be placed on file.  . fluocinonide-emollient (LIDEX-E) 0.05 % cream    Sig: Apply 1 application topically 2 (two) times daily. To affected areas    Dispense:  60 g    Refill:  5   BP management per Dialysis team. Again encouraged regular use of trulicity.  Follow-up: Return in about 6 weeks (around  10/20/2018).  Claretta Fraise, M.D.

## 2018-09-09 DIAGNOSIS — N2581 Secondary hyperparathyroidism of renal origin: Secondary | ICD-10-CM | POA: Diagnosis not present

## 2018-09-09 DIAGNOSIS — Z992 Dependence on renal dialysis: Secondary | ICD-10-CM | POA: Diagnosis not present

## 2018-09-09 DIAGNOSIS — N186 End stage renal disease: Secondary | ICD-10-CM | POA: Diagnosis not present

## 2018-09-09 DIAGNOSIS — D509 Iron deficiency anemia, unspecified: Secondary | ICD-10-CM | POA: Diagnosis not present

## 2018-09-09 DIAGNOSIS — D631 Anemia in chronic kidney disease: Secondary | ICD-10-CM | POA: Diagnosis not present

## 2018-09-09 LAB — BMP8+EGFR
BUN/Creatinine Ratio: 4 — ABNORMAL LOW (ref 12–28)
BUN: 27 mg/dL (ref 8–27)
CO2: 22 mmol/L (ref 20–29)
Calcium: 9.7 mg/dL (ref 8.7–10.3)
Chloride: 92 mmol/L — ABNORMAL LOW (ref 96–106)
Creatinine, Ser: 6.26 mg/dL (ref 0.57–1.00)
GFR calc Af Amer: 8 mL/min/{1.73_m2} — ABNORMAL LOW (ref >59)
GFR calc non Af Amer: 7 mL/min/{1.73_m2} — ABNORMAL LOW (ref >59)
Glucose: 245 mg/dL — ABNORMAL HIGH (ref 65–99)
Potassium: 4.7 mmol/L (ref 3.5–5.2)
Sodium: 137 mmol/L (ref 134–144)

## 2018-09-11 DIAGNOSIS — D509 Iron deficiency anemia, unspecified: Secondary | ICD-10-CM | POA: Diagnosis not present

## 2018-09-11 DIAGNOSIS — Z992 Dependence on renal dialysis: Secondary | ICD-10-CM | POA: Diagnosis not present

## 2018-09-11 DIAGNOSIS — N2581 Secondary hyperparathyroidism of renal origin: Secondary | ICD-10-CM | POA: Diagnosis not present

## 2018-09-11 DIAGNOSIS — D631 Anemia in chronic kidney disease: Secondary | ICD-10-CM | POA: Diagnosis not present

## 2018-09-11 DIAGNOSIS — N186 End stage renal disease: Secondary | ICD-10-CM | POA: Diagnosis not present

## 2018-09-14 DIAGNOSIS — N2581 Secondary hyperparathyroidism of renal origin: Secondary | ICD-10-CM | POA: Diagnosis not present

## 2018-09-14 DIAGNOSIS — N186 End stage renal disease: Secondary | ICD-10-CM | POA: Diagnosis not present

## 2018-09-14 DIAGNOSIS — Z992 Dependence on renal dialysis: Secondary | ICD-10-CM | POA: Diagnosis not present

## 2018-09-14 DIAGNOSIS — D509 Iron deficiency anemia, unspecified: Secondary | ICD-10-CM | POA: Diagnosis not present

## 2018-09-14 DIAGNOSIS — D631 Anemia in chronic kidney disease: Secondary | ICD-10-CM | POA: Diagnosis not present

## 2018-09-16 DIAGNOSIS — N2581 Secondary hyperparathyroidism of renal origin: Secondary | ICD-10-CM | POA: Diagnosis not present

## 2018-09-16 DIAGNOSIS — D509 Iron deficiency anemia, unspecified: Secondary | ICD-10-CM | POA: Diagnosis not present

## 2018-09-16 DIAGNOSIS — D631 Anemia in chronic kidney disease: Secondary | ICD-10-CM | POA: Diagnosis not present

## 2018-09-16 DIAGNOSIS — Z992 Dependence on renal dialysis: Secondary | ICD-10-CM | POA: Diagnosis not present

## 2018-09-16 DIAGNOSIS — N186 End stage renal disease: Secondary | ICD-10-CM | POA: Diagnosis not present

## 2018-09-17 DIAGNOSIS — B351 Tinea unguium: Secondary | ICD-10-CM | POA: Diagnosis not present

## 2018-09-17 DIAGNOSIS — M79671 Pain in right foot: Secondary | ICD-10-CM | POA: Diagnosis not present

## 2018-09-17 DIAGNOSIS — M79672 Pain in left foot: Secondary | ICD-10-CM | POA: Diagnosis not present

## 2018-09-17 DIAGNOSIS — E1151 Type 2 diabetes mellitus with diabetic peripheral angiopathy without gangrene: Secondary | ICD-10-CM | POA: Diagnosis not present

## 2018-09-18 DIAGNOSIS — N186 End stage renal disease: Secondary | ICD-10-CM | POA: Diagnosis not present

## 2018-09-18 DIAGNOSIS — Z992 Dependence on renal dialysis: Secondary | ICD-10-CM | POA: Diagnosis not present

## 2018-09-18 DIAGNOSIS — D631 Anemia in chronic kidney disease: Secondary | ICD-10-CM | POA: Diagnosis not present

## 2018-09-18 DIAGNOSIS — N2581 Secondary hyperparathyroidism of renal origin: Secondary | ICD-10-CM | POA: Diagnosis not present

## 2018-09-18 DIAGNOSIS — D509 Iron deficiency anemia, unspecified: Secondary | ICD-10-CM | POA: Diagnosis not present

## 2018-09-21 DIAGNOSIS — Z992 Dependence on renal dialysis: Secondary | ICD-10-CM | POA: Diagnosis not present

## 2018-09-21 DIAGNOSIS — N2581 Secondary hyperparathyroidism of renal origin: Secondary | ICD-10-CM | POA: Diagnosis not present

## 2018-09-21 DIAGNOSIS — D631 Anemia in chronic kidney disease: Secondary | ICD-10-CM | POA: Diagnosis not present

## 2018-09-21 DIAGNOSIS — N186 End stage renal disease: Secondary | ICD-10-CM | POA: Diagnosis not present

## 2018-09-21 DIAGNOSIS — D509 Iron deficiency anemia, unspecified: Secondary | ICD-10-CM | POA: Diagnosis not present

## 2018-09-23 DIAGNOSIS — D631 Anemia in chronic kidney disease: Secondary | ICD-10-CM | POA: Diagnosis not present

## 2018-09-23 DIAGNOSIS — N186 End stage renal disease: Secondary | ICD-10-CM | POA: Diagnosis not present

## 2018-09-23 DIAGNOSIS — N2581 Secondary hyperparathyroidism of renal origin: Secondary | ICD-10-CM | POA: Diagnosis not present

## 2018-09-23 DIAGNOSIS — Z992 Dependence on renal dialysis: Secondary | ICD-10-CM | POA: Diagnosis not present

## 2018-09-23 DIAGNOSIS — D509 Iron deficiency anemia, unspecified: Secondary | ICD-10-CM | POA: Diagnosis not present

## 2018-09-25 DIAGNOSIS — N186 End stage renal disease: Secondary | ICD-10-CM | POA: Diagnosis not present

## 2018-09-25 DIAGNOSIS — Z992 Dependence on renal dialysis: Secondary | ICD-10-CM | POA: Diagnosis not present

## 2018-09-25 DIAGNOSIS — D631 Anemia in chronic kidney disease: Secondary | ICD-10-CM | POA: Diagnosis not present

## 2018-09-25 DIAGNOSIS — D509 Iron deficiency anemia, unspecified: Secondary | ICD-10-CM | POA: Diagnosis not present

## 2018-09-25 DIAGNOSIS — N2581 Secondary hyperparathyroidism of renal origin: Secondary | ICD-10-CM | POA: Diagnosis not present

## 2018-09-27 DIAGNOSIS — N186 End stage renal disease: Secondary | ICD-10-CM | POA: Diagnosis not present

## 2018-09-27 DIAGNOSIS — Z992 Dependence on renal dialysis: Secondary | ICD-10-CM | POA: Diagnosis not present

## 2018-09-28 DIAGNOSIS — N2581 Secondary hyperparathyroidism of renal origin: Secondary | ICD-10-CM | POA: Diagnosis not present

## 2018-09-28 DIAGNOSIS — N186 End stage renal disease: Secondary | ICD-10-CM | POA: Diagnosis not present

## 2018-09-28 DIAGNOSIS — Z992 Dependence on renal dialysis: Secondary | ICD-10-CM | POA: Diagnosis not present

## 2018-09-28 DIAGNOSIS — D509 Iron deficiency anemia, unspecified: Secondary | ICD-10-CM | POA: Diagnosis not present

## 2018-09-28 DIAGNOSIS — D631 Anemia in chronic kidney disease: Secondary | ICD-10-CM | POA: Diagnosis not present

## 2018-09-30 DIAGNOSIS — N2581 Secondary hyperparathyroidism of renal origin: Secondary | ICD-10-CM | POA: Diagnosis not present

## 2018-09-30 DIAGNOSIS — Z992 Dependence on renal dialysis: Secondary | ICD-10-CM | POA: Diagnosis not present

## 2018-09-30 DIAGNOSIS — D631 Anemia in chronic kidney disease: Secondary | ICD-10-CM | POA: Diagnosis not present

## 2018-09-30 DIAGNOSIS — N186 End stage renal disease: Secondary | ICD-10-CM | POA: Diagnosis not present

## 2018-09-30 DIAGNOSIS — D509 Iron deficiency anemia, unspecified: Secondary | ICD-10-CM | POA: Diagnosis not present

## 2018-10-02 DIAGNOSIS — D631 Anemia in chronic kidney disease: Secondary | ICD-10-CM | POA: Diagnosis not present

## 2018-10-02 DIAGNOSIS — Z992 Dependence on renal dialysis: Secondary | ICD-10-CM | POA: Diagnosis not present

## 2018-10-02 DIAGNOSIS — N2581 Secondary hyperparathyroidism of renal origin: Secondary | ICD-10-CM | POA: Diagnosis not present

## 2018-10-02 DIAGNOSIS — D509 Iron deficiency anemia, unspecified: Secondary | ICD-10-CM | POA: Diagnosis not present

## 2018-10-02 DIAGNOSIS — N186 End stage renal disease: Secondary | ICD-10-CM | POA: Diagnosis not present

## 2018-10-05 DIAGNOSIS — N2581 Secondary hyperparathyroidism of renal origin: Secondary | ICD-10-CM | POA: Diagnosis not present

## 2018-10-05 DIAGNOSIS — N186 End stage renal disease: Secondary | ICD-10-CM | POA: Diagnosis not present

## 2018-10-05 DIAGNOSIS — D631 Anemia in chronic kidney disease: Secondary | ICD-10-CM | POA: Diagnosis not present

## 2018-10-05 DIAGNOSIS — D509 Iron deficiency anemia, unspecified: Secondary | ICD-10-CM | POA: Diagnosis not present

## 2018-10-05 DIAGNOSIS — Z992 Dependence on renal dialysis: Secondary | ICD-10-CM | POA: Diagnosis not present

## 2018-10-07 DIAGNOSIS — N186 End stage renal disease: Secondary | ICD-10-CM | POA: Diagnosis not present

## 2018-10-07 DIAGNOSIS — Z992 Dependence on renal dialysis: Secondary | ICD-10-CM | POA: Diagnosis not present

## 2018-10-07 DIAGNOSIS — D509 Iron deficiency anemia, unspecified: Secondary | ICD-10-CM | POA: Diagnosis not present

## 2018-10-07 DIAGNOSIS — D631 Anemia in chronic kidney disease: Secondary | ICD-10-CM | POA: Diagnosis not present

## 2018-10-07 DIAGNOSIS — N2581 Secondary hyperparathyroidism of renal origin: Secondary | ICD-10-CM | POA: Diagnosis not present

## 2018-10-09 DIAGNOSIS — D509 Iron deficiency anemia, unspecified: Secondary | ICD-10-CM | POA: Diagnosis not present

## 2018-10-09 DIAGNOSIS — N2581 Secondary hyperparathyroidism of renal origin: Secondary | ICD-10-CM | POA: Diagnosis not present

## 2018-10-09 DIAGNOSIS — Z992 Dependence on renal dialysis: Secondary | ICD-10-CM | POA: Diagnosis not present

## 2018-10-09 DIAGNOSIS — N186 End stage renal disease: Secondary | ICD-10-CM | POA: Diagnosis not present

## 2018-10-09 DIAGNOSIS — D631 Anemia in chronic kidney disease: Secondary | ICD-10-CM | POA: Diagnosis not present

## 2018-10-12 DIAGNOSIS — N2581 Secondary hyperparathyroidism of renal origin: Secondary | ICD-10-CM | POA: Diagnosis not present

## 2018-10-12 DIAGNOSIS — Z992 Dependence on renal dialysis: Secondary | ICD-10-CM | POA: Diagnosis not present

## 2018-10-12 DIAGNOSIS — N186 End stage renal disease: Secondary | ICD-10-CM | POA: Diagnosis not present

## 2018-10-12 DIAGNOSIS — D631 Anemia in chronic kidney disease: Secondary | ICD-10-CM | POA: Diagnosis not present

## 2018-10-12 DIAGNOSIS — D509 Iron deficiency anemia, unspecified: Secondary | ICD-10-CM | POA: Diagnosis not present

## 2018-10-14 DIAGNOSIS — N2581 Secondary hyperparathyroidism of renal origin: Secondary | ICD-10-CM | POA: Diagnosis not present

## 2018-10-14 DIAGNOSIS — D631 Anemia in chronic kidney disease: Secondary | ICD-10-CM | POA: Diagnosis not present

## 2018-10-14 DIAGNOSIS — N186 End stage renal disease: Secondary | ICD-10-CM | POA: Diagnosis not present

## 2018-10-14 DIAGNOSIS — Z992 Dependence on renal dialysis: Secondary | ICD-10-CM | POA: Diagnosis not present

## 2018-10-14 DIAGNOSIS — D509 Iron deficiency anemia, unspecified: Secondary | ICD-10-CM | POA: Diagnosis not present

## 2018-10-16 DIAGNOSIS — D509 Iron deficiency anemia, unspecified: Secondary | ICD-10-CM | POA: Diagnosis not present

## 2018-10-16 DIAGNOSIS — N2581 Secondary hyperparathyroidism of renal origin: Secondary | ICD-10-CM | POA: Diagnosis not present

## 2018-10-16 DIAGNOSIS — N186 End stage renal disease: Secondary | ICD-10-CM | POA: Diagnosis not present

## 2018-10-16 DIAGNOSIS — Z992 Dependence on renal dialysis: Secondary | ICD-10-CM | POA: Diagnosis not present

## 2018-10-16 DIAGNOSIS — D631 Anemia in chronic kidney disease: Secondary | ICD-10-CM | POA: Diagnosis not present

## 2018-10-19 DIAGNOSIS — Z794 Long term (current) use of insulin: Secondary | ICD-10-CM | POA: Diagnosis not present

## 2018-10-19 DIAGNOSIS — E119 Type 2 diabetes mellitus without complications: Secondary | ICD-10-CM | POA: Diagnosis not present

## 2018-10-19 DIAGNOSIS — D631 Anemia in chronic kidney disease: Secondary | ICD-10-CM | POA: Diagnosis not present

## 2018-10-19 DIAGNOSIS — N2581 Secondary hyperparathyroidism of renal origin: Secondary | ICD-10-CM | POA: Diagnosis not present

## 2018-10-19 DIAGNOSIS — Z992 Dependence on renal dialysis: Secondary | ICD-10-CM | POA: Diagnosis not present

## 2018-10-19 DIAGNOSIS — N186 End stage renal disease: Secondary | ICD-10-CM | POA: Diagnosis not present

## 2018-10-19 DIAGNOSIS — D509 Iron deficiency anemia, unspecified: Secondary | ICD-10-CM | POA: Diagnosis not present

## 2018-10-20 ENCOUNTER — Encounter: Payer: Self-pay | Admitting: Family Medicine

## 2018-10-20 ENCOUNTER — Ambulatory Visit (INDEPENDENT_AMBULATORY_CARE_PROVIDER_SITE_OTHER): Payer: Medicare Other | Admitting: Family Medicine

## 2018-10-20 ENCOUNTER — Other Ambulatory Visit: Payer: Self-pay

## 2018-10-20 DIAGNOSIS — N186 End stage renal disease: Secondary | ICD-10-CM

## 2018-10-20 DIAGNOSIS — Z794 Long term (current) use of insulin: Secondary | ICD-10-CM

## 2018-10-20 DIAGNOSIS — Z992 Dependence on renal dialysis: Secondary | ICD-10-CM | POA: Diagnosis not present

## 2018-10-20 DIAGNOSIS — E1122 Type 2 diabetes mellitus with diabetic chronic kidney disease: Secondary | ICD-10-CM

## 2018-10-20 MED ORDER — TRULICITY 1.5 MG/0.5ML ~~LOC~~ SOAJ: SUBCUTANEOUS | 12 refills | Status: DC

## 2018-10-20 NOTE — Progress Notes (Signed)
Subjective:    Patient ID: Christy Zhang, female    DOB: 15-Mar-1956, 63 y.o.   MRN: [id number]   HPI: Christy Zhang is a 63 y.o. female presenting for recheck of diabetes. Using the once a week shot  On Saturday of Trulicity. in addition to toujeo.  [phone] are recent fasting readings.  194, 202, 174, 180 post prandial.   Depression screen [facility] 2/9 09/08/2018 07/14/2018 10/14/2017 09/11/2017 06/19/2017  Decreased Interest 0 0 0 0 0  Down, Depressed, Hopeless 0 0 0 0 0  PHQ - 2 Score 0 0 0 0 0  Altered sleeping 0 - - - -  Tired, decreased energy 0 - - - -  Change in appetite 0 - - - -  Feeling bad or failure about yourself  0 - - - -  Trouble concentrating 0 - - - -  Moving slowly or fidgety/restless 0 - - - -  Suicidal thoughts 0 - - - -  PHQ-9 Score 0 - - - -     Relevant past medical, surgical, family and social history reviewed and updated as indicated.  Interim medical history since our last visit reviewed. Allergies and medications reviewed and updated.  ROS:  Review of Systems  Constitutional: Negative.   HENT: Negative for congestion.   Eyes: Negative for visual disturbance.  Respiratory: Negative for cough and shortness of breath.   Cardiovascular: Negative for chest pain.  Gastrointestinal: Negative for abdominal pain, constipation, diarrhea, nausea and vomiting.  Musculoskeletal: Negative for arthralgias and myalgias.  Neurological: Negative for headaches.  Psychiatric/Behavioral: Negative for sleep disturbance.     Social History   Tobacco Use  Smoking Status Former Smoker  . Types: Cigarettes  . Quit date: 07/25/1984  . Years since quitting: 34.2  Smokeless Tobacco Never Used       Objective:     Wt Readings from Last 3 Encounters:  09/08/18 263 lb (119.3 kg)  07/14/18 263 lb 6 oz (119.5 kg)  10/14/17 262 lb 6 oz (119 kg)     Exam deferred. Pt. [place] due to COVID 19. Phone visit performed.   Assessment & Plan:   1. Controlled  type 2 diabetes mellitus with chronic kidney disease on chronic dialysis, with long-term current use of insulin ([place])     Meds ordered this encounter  Medications  . Dulaglutide (TRULICITY) 1.5 DV/7.6HY SOPN    Sig: Inject content of one pen under the skin weekly on Saturday    Dispense:  4 pen    Refill:  12    No orders of the defined types were placed in this encounter.     Diagnoses and all orders for this visit:  Controlled type 2 diabetes mellitus with chronic kidney disease on chronic dialysis, with long-term current use of insulin ([place])  Other orders -     Dulaglutide (TRULICITY) 1.5 WV/3.7TG SOPN; Inject content of one pen under the skin weekly on Saturday    Virtual Visit via telephone Note  I discussed the limitations, risks, security and privacy concerns of performing an evaluation and management service by telephone and the availability of in person appointments. The patient was identified with two identifiers. Pt.expressed understanding and agreed to proceed. Pt. Is at home. Dr. Livia Snellen is in his office.  Follow Up Instructions:   I discussed the assessment and treatment plan with the patient. The patient was provided an opportunity to ask questions and all were answered. The patient agreed with the plan and demonstrated  an understanding of the instructions.   The patient was advised to call back or seek an in-person evaluation if the symptoms worsen or if the condition fails to improve as anticipated.   Total minutes including chart review and phone contact time: 15   Follow up plan: Return in about 6 weeks (around 12/01/2018).  Claretta Fraise, MD [place]

## 2018-10-21 DIAGNOSIS — D509 Iron deficiency anemia, unspecified: Secondary | ICD-10-CM | POA: Diagnosis not present

## 2018-10-21 DIAGNOSIS — D631 Anemia in chronic kidney disease: Secondary | ICD-10-CM | POA: Diagnosis not present

## 2018-10-21 DIAGNOSIS — N2581 Secondary hyperparathyroidism of renal origin: Secondary | ICD-10-CM | POA: Diagnosis not present

## 2018-10-21 DIAGNOSIS — Z992 Dependence on renal dialysis: Secondary | ICD-10-CM | POA: Diagnosis not present

## 2018-10-21 DIAGNOSIS — N186 End stage renal disease: Secondary | ICD-10-CM | POA: Diagnosis not present

## 2018-10-23 DIAGNOSIS — D631 Anemia in chronic kidney disease: Secondary | ICD-10-CM | POA: Diagnosis not present

## 2018-10-23 DIAGNOSIS — Z992 Dependence on renal dialysis: Secondary | ICD-10-CM | POA: Diagnosis not present

## 2018-10-23 DIAGNOSIS — D509 Iron deficiency anemia, unspecified: Secondary | ICD-10-CM | POA: Diagnosis not present

## 2018-10-23 DIAGNOSIS — N2581 Secondary hyperparathyroidism of renal origin: Secondary | ICD-10-CM | POA: Diagnosis not present

## 2018-10-23 DIAGNOSIS — N186 End stage renal disease: Secondary | ICD-10-CM | POA: Diagnosis not present

## 2018-10-26 DIAGNOSIS — D509 Iron deficiency anemia, unspecified: Secondary | ICD-10-CM | POA: Diagnosis not present

## 2018-10-26 DIAGNOSIS — Z992 Dependence on renal dialysis: Secondary | ICD-10-CM | POA: Diagnosis not present

## 2018-10-26 DIAGNOSIS — N2581 Secondary hyperparathyroidism of renal origin: Secondary | ICD-10-CM | POA: Diagnosis not present

## 2018-10-26 DIAGNOSIS — N186 End stage renal disease: Secondary | ICD-10-CM | POA: Diagnosis not present

## 2018-10-26 DIAGNOSIS — D631 Anemia in chronic kidney disease: Secondary | ICD-10-CM | POA: Diagnosis not present

## 2018-10-27 DIAGNOSIS — N186 End stage renal disease: Secondary | ICD-10-CM | POA: Diagnosis not present

## 2018-10-27 DIAGNOSIS — Z992 Dependence on renal dialysis: Secondary | ICD-10-CM | POA: Diagnosis not present

## 2018-10-28 DIAGNOSIS — N186 End stage renal disease: Secondary | ICD-10-CM | POA: Diagnosis not present

## 2018-10-28 DIAGNOSIS — Z992 Dependence on renal dialysis: Secondary | ICD-10-CM | POA: Diagnosis not present

## 2018-10-28 DIAGNOSIS — D509 Iron deficiency anemia, unspecified: Secondary | ICD-10-CM | POA: Diagnosis not present

## 2018-10-28 DIAGNOSIS — D631 Anemia in chronic kidney disease: Secondary | ICD-10-CM | POA: Diagnosis not present

## 2018-10-28 DIAGNOSIS — N2581 Secondary hyperparathyroidism of renal origin: Secondary | ICD-10-CM | POA: Diagnosis not present

## 2018-10-30 DIAGNOSIS — D631 Anemia in chronic kidney disease: Secondary | ICD-10-CM | POA: Diagnosis not present

## 2018-10-30 DIAGNOSIS — N186 End stage renal disease: Secondary | ICD-10-CM | POA: Diagnosis not present

## 2018-10-30 DIAGNOSIS — N2581 Secondary hyperparathyroidism of renal origin: Secondary | ICD-10-CM | POA: Diagnosis not present

## 2018-10-30 DIAGNOSIS — D509 Iron deficiency anemia, unspecified: Secondary | ICD-10-CM | POA: Diagnosis not present

## 2018-10-30 DIAGNOSIS — Z992 Dependence on renal dialysis: Secondary | ICD-10-CM | POA: Diagnosis not present

## 2018-11-02 DIAGNOSIS — Z992 Dependence on renal dialysis: Secondary | ICD-10-CM | POA: Diagnosis not present

## 2018-11-02 DIAGNOSIS — D509 Iron deficiency anemia, unspecified: Secondary | ICD-10-CM | POA: Diagnosis not present

## 2018-11-02 DIAGNOSIS — D631 Anemia in chronic kidney disease: Secondary | ICD-10-CM | POA: Diagnosis not present

## 2018-11-02 DIAGNOSIS — N186 End stage renal disease: Secondary | ICD-10-CM | POA: Diagnosis not present

## 2018-11-02 DIAGNOSIS — N2581 Secondary hyperparathyroidism of renal origin: Secondary | ICD-10-CM | POA: Diagnosis not present

## 2018-11-04 DIAGNOSIS — N186 End stage renal disease: Secondary | ICD-10-CM | POA: Diagnosis not present

## 2018-11-04 DIAGNOSIS — D509 Iron deficiency anemia, unspecified: Secondary | ICD-10-CM | POA: Diagnosis not present

## 2018-11-04 DIAGNOSIS — Z992 Dependence on renal dialysis: Secondary | ICD-10-CM | POA: Diagnosis not present

## 2018-11-04 DIAGNOSIS — N2581 Secondary hyperparathyroidism of renal origin: Secondary | ICD-10-CM | POA: Diagnosis not present

## 2018-11-04 DIAGNOSIS — D631 Anemia in chronic kidney disease: Secondary | ICD-10-CM | POA: Diagnosis not present

## 2018-11-06 DIAGNOSIS — N2581 Secondary hyperparathyroidism of renal origin: Secondary | ICD-10-CM | POA: Diagnosis not present

## 2018-11-06 DIAGNOSIS — D509 Iron deficiency anemia, unspecified: Secondary | ICD-10-CM | POA: Diagnosis not present

## 2018-11-06 DIAGNOSIS — N186 End stage renal disease: Secondary | ICD-10-CM | POA: Diagnosis not present

## 2018-11-06 DIAGNOSIS — D631 Anemia in chronic kidney disease: Secondary | ICD-10-CM | POA: Diagnosis not present

## 2018-11-06 DIAGNOSIS — Z992 Dependence on renal dialysis: Secondary | ICD-10-CM | POA: Diagnosis not present

## 2018-11-09 DIAGNOSIS — D631 Anemia in chronic kidney disease: Secondary | ICD-10-CM | POA: Diagnosis not present

## 2018-11-09 DIAGNOSIS — Z992 Dependence on renal dialysis: Secondary | ICD-10-CM | POA: Diagnosis not present

## 2018-11-09 DIAGNOSIS — D509 Iron deficiency anemia, unspecified: Secondary | ICD-10-CM | POA: Diagnosis not present

## 2018-11-09 DIAGNOSIS — N2581 Secondary hyperparathyroidism of renal origin: Secondary | ICD-10-CM | POA: Diagnosis not present

## 2018-11-09 DIAGNOSIS — N186 End stage renal disease: Secondary | ICD-10-CM | POA: Diagnosis not present

## 2018-11-11 DIAGNOSIS — Z992 Dependence on renal dialysis: Secondary | ICD-10-CM | POA: Diagnosis not present

## 2018-11-11 DIAGNOSIS — N2581 Secondary hyperparathyroidism of renal origin: Secondary | ICD-10-CM | POA: Diagnosis not present

## 2018-11-11 DIAGNOSIS — N186 End stage renal disease: Secondary | ICD-10-CM | POA: Diagnosis not present

## 2018-11-11 DIAGNOSIS — D509 Iron deficiency anemia, unspecified: Secondary | ICD-10-CM | POA: Diagnosis not present

## 2018-11-11 DIAGNOSIS — D631 Anemia in chronic kidney disease: Secondary | ICD-10-CM | POA: Diagnosis not present

## 2018-11-13 DIAGNOSIS — N2581 Secondary hyperparathyroidism of renal origin: Secondary | ICD-10-CM | POA: Diagnosis not present

## 2018-11-13 DIAGNOSIS — Z992 Dependence on renal dialysis: Secondary | ICD-10-CM | POA: Diagnosis not present

## 2018-11-13 DIAGNOSIS — D509 Iron deficiency anemia, unspecified: Secondary | ICD-10-CM | POA: Diagnosis not present

## 2018-11-13 DIAGNOSIS — N186 End stage renal disease: Secondary | ICD-10-CM | POA: Diagnosis not present

## 2018-11-13 DIAGNOSIS — D631 Anemia in chronic kidney disease: Secondary | ICD-10-CM | POA: Diagnosis not present

## 2018-11-16 DIAGNOSIS — D509 Iron deficiency anemia, unspecified: Secondary | ICD-10-CM | POA: Diagnosis not present

## 2018-11-16 DIAGNOSIS — D631 Anemia in chronic kidney disease: Secondary | ICD-10-CM | POA: Diagnosis not present

## 2018-11-16 DIAGNOSIS — N186 End stage renal disease: Secondary | ICD-10-CM | POA: Diagnosis not present

## 2018-11-16 DIAGNOSIS — Z992 Dependence on renal dialysis: Secondary | ICD-10-CM | POA: Diagnosis not present

## 2018-11-16 DIAGNOSIS — N2581 Secondary hyperparathyroidism of renal origin: Secondary | ICD-10-CM | POA: Diagnosis not present

## 2018-11-18 DIAGNOSIS — D509 Iron deficiency anemia, unspecified: Secondary | ICD-10-CM | POA: Diagnosis not present

## 2018-11-18 DIAGNOSIS — Z992 Dependence on renal dialysis: Secondary | ICD-10-CM | POA: Diagnosis not present

## 2018-11-18 DIAGNOSIS — D631 Anemia in chronic kidney disease: Secondary | ICD-10-CM | POA: Diagnosis not present

## 2018-11-18 DIAGNOSIS — N2581 Secondary hyperparathyroidism of renal origin: Secondary | ICD-10-CM | POA: Diagnosis not present

## 2018-11-18 DIAGNOSIS — N186 End stage renal disease: Secondary | ICD-10-CM | POA: Diagnosis not present

## 2018-11-20 ENCOUNTER — Other Ambulatory Visit: Payer: Self-pay

## 2018-11-20 DIAGNOSIS — Z992 Dependence on renal dialysis: Secondary | ICD-10-CM

## 2018-11-20 DIAGNOSIS — N186 End stage renal disease: Secondary | ICD-10-CM | POA: Diagnosis not present

## 2018-11-20 DIAGNOSIS — D509 Iron deficiency anemia, unspecified: Secondary | ICD-10-CM | POA: Diagnosis not present

## 2018-11-20 DIAGNOSIS — N2581 Secondary hyperparathyroidism of renal origin: Secondary | ICD-10-CM | POA: Diagnosis not present

## 2018-11-20 DIAGNOSIS — D631 Anemia in chronic kidney disease: Secondary | ICD-10-CM | POA: Diagnosis not present

## 2018-11-23 DIAGNOSIS — Z992 Dependence on renal dialysis: Secondary | ICD-10-CM | POA: Diagnosis not present

## 2018-11-23 DIAGNOSIS — N186 End stage renal disease: Secondary | ICD-10-CM | POA: Diagnosis not present

## 2018-11-23 DIAGNOSIS — D509 Iron deficiency anemia, unspecified: Secondary | ICD-10-CM | POA: Diagnosis not present

## 2018-11-23 DIAGNOSIS — D631 Anemia in chronic kidney disease: Secondary | ICD-10-CM | POA: Diagnosis not present

## 2018-11-23 DIAGNOSIS — N2581 Secondary hyperparathyroidism of renal origin: Secondary | ICD-10-CM | POA: Diagnosis not present

## 2018-11-24 ENCOUNTER — Telehealth: Payer: Self-pay | Admitting: *Deleted

## 2018-11-24 ENCOUNTER — Other Ambulatory Visit: Payer: Self-pay

## 2018-11-24 ENCOUNTER — Other Ambulatory Visit (HOSPITAL_COMMUNITY)
Admission: RE | Admit: 2018-11-24 | Discharge: 2018-11-24 | Disposition: A | Discharge status: Home / Self Care | Payer: Medicare Other | Source: Ambulatory Visit | Attending: Vascular Surgery | Admitting: Vascular Surgery

## 2018-11-24 ENCOUNTER — Other Ambulatory Visit: Payer: Self-pay | Admitting: *Deleted

## 2018-11-24 NOTE — Progress Notes (Signed)
Message left for patient of provider change for fistulogram on 11/27/2018 with Dr. Scot Dock.

## 2018-11-24 NOTE — Telephone Encounter (Signed)
Close encounter 

## 2018-11-25 ENCOUNTER — Telehealth: Payer: Self-pay | Admitting: *Deleted

## 2018-11-25 DIAGNOSIS — N186 End stage renal disease: Secondary | ICD-10-CM | POA: Diagnosis not present

## 2018-11-25 DIAGNOSIS — D631 Anemia in chronic kidney disease: Secondary | ICD-10-CM | POA: Diagnosis not present

## 2018-11-25 DIAGNOSIS — D509 Iron deficiency anemia, unspecified: Secondary | ICD-10-CM | POA: Diagnosis not present

## 2018-11-25 DIAGNOSIS — N2581 Secondary hyperparathyroidism of renal origin: Secondary | ICD-10-CM | POA: Diagnosis not present

## 2018-11-25 DIAGNOSIS — Z992 Dependence on renal dialysis: Secondary | ICD-10-CM | POA: Diagnosis not present

## 2018-11-25 NOTE — Telephone Encounter (Signed)
Received a call from joy at [facility] that Christy Zhang is refusing her fistulogram at this time. She was referred to [place] by Dr. Lowanda Foster for "excessive bleeding around needle site during treatment and extremely high venous pressures". The patient is cancelling her fistulogram for this Friday 11-27-18 due to transportation problems and does not want to reschedule until she gets her car fixed. The nurses at [facility] have repeatedly asked the patient to get alternate transportation by a friend or family member but the patient is adamant about not scheduling at this time. [facility] will call us when she is agreeable to fistulogram.

## 2018-11-27 ENCOUNTER — Encounter (HOSPITAL_COMMUNITY): Payer: Self-pay

## 2018-11-27 ENCOUNTER — Ambulatory Visit (HOSPITAL_COMMUNITY): Admit: 2018-11-27 | Payer: Medicare Other | Admitting: Vascular Surgery

## 2018-11-27 DIAGNOSIS — Z992 Dependence on renal dialysis: Secondary | ICD-10-CM | POA: Diagnosis not present

## 2018-11-27 DIAGNOSIS — N186 End stage renal disease: Secondary | ICD-10-CM | POA: Diagnosis not present

## 2018-11-27 DIAGNOSIS — D509 Iron deficiency anemia, unspecified: Secondary | ICD-10-CM | POA: Diagnosis not present

## 2018-11-27 DIAGNOSIS — N2581 Secondary hyperparathyroidism of renal origin: Secondary | ICD-10-CM | POA: Diagnosis not present

## 2018-11-27 DIAGNOSIS — D631 Anemia in chronic kidney disease: Secondary | ICD-10-CM | POA: Diagnosis not present

## 2018-11-27 SURGERY — A/V FISTULAGRAM
Anesthesia: LOCAL | Laterality: Right

## 2018-11-30 ENCOUNTER — Encounter: Payer: Self-pay | Admitting: *Deleted

## 2018-11-30 ENCOUNTER — Other Ambulatory Visit: Payer: Self-pay | Admitting: *Deleted

## 2018-11-30 DIAGNOSIS — Z992 Dependence on renal dialysis: Secondary | ICD-10-CM | POA: Diagnosis not present

## 2018-11-30 DIAGNOSIS — D631 Anemia in chronic kidney disease: Secondary | ICD-10-CM | POA: Diagnosis not present

## 2018-11-30 DIAGNOSIS — D509 Iron deficiency anemia, unspecified: Secondary | ICD-10-CM | POA: Diagnosis not present

## 2018-11-30 DIAGNOSIS — N186 End stage renal disease: Secondary | ICD-10-CM | POA: Diagnosis not present

## 2018-11-30 DIAGNOSIS — N2581 Secondary hyperparathyroidism of renal origin: Secondary | ICD-10-CM | POA: Diagnosis not present

## 2018-12-02 ENCOUNTER — Other Ambulatory Visit (HOSPITAL_COMMUNITY): Payer: Self-pay | Admitting: Nephrology

## 2018-12-02 DIAGNOSIS — Z992 Dependence on renal dialysis: Secondary | ICD-10-CM

## 2018-12-02 DIAGNOSIS — N186 End stage renal disease: Secondary | ICD-10-CM

## 2018-12-03 ENCOUNTER — Other Ambulatory Visit (HOSPITAL_COMMUNITY): Payer: Self-pay | Admitting: Nephrology

## 2018-12-03 ENCOUNTER — Other Ambulatory Visit: Payer: Self-pay

## 2018-12-03 ENCOUNTER — Encounter (HOSPITAL_COMMUNITY): Payer: Self-pay | Admitting: Interventional Radiology

## 2018-12-03 ENCOUNTER — Ambulatory Visit (HOSPITAL_COMMUNITY)
Admission: RE | Admit: 2018-12-03 | Discharge: 2018-12-03 | Disposition: A | Discharge status: Home / Self Care | Payer: Medicare Other | Source: Ambulatory Visit | Attending: Nephrology | Admitting: Nephrology

## 2018-12-03 DIAGNOSIS — E669 Obesity, unspecified: Secondary | ICD-10-CM | POA: Insufficient documentation

## 2018-12-03 DIAGNOSIS — T82868A Thrombosis of vascular prosthetic devices, implants and grafts, initial encounter: Secondary | ICD-10-CM | POA: Diagnosis not present

## 2018-12-03 DIAGNOSIS — Z87891 Personal history of nicotine dependence: Secondary | ICD-10-CM | POA: Diagnosis not present

## 2018-12-03 DIAGNOSIS — Z79899 Other long term (current) drug therapy: Secondary | ICD-10-CM | POA: Diagnosis not present

## 2018-12-03 DIAGNOSIS — E1122 Type 2 diabetes mellitus with diabetic chronic kidney disease: Secondary | ICD-10-CM | POA: Diagnosis not present

## 2018-12-03 DIAGNOSIS — N186 End stage renal disease: Secondary | ICD-10-CM | POA: Diagnosis not present

## 2018-12-03 DIAGNOSIS — N179 Acute kidney failure, unspecified: Secondary | ICD-10-CM | POA: Insufficient documentation

## 2018-12-03 DIAGNOSIS — I509 Heart failure, unspecified: Secondary | ICD-10-CM | POA: Insufficient documentation

## 2018-12-03 DIAGNOSIS — Z992 Dependence on renal dialysis: Secondary | ICD-10-CM | POA: Insufficient documentation

## 2018-12-03 DIAGNOSIS — Z9851 Tubal ligation status: Secondary | ICD-10-CM | POA: Diagnosis not present

## 2018-12-03 DIAGNOSIS — Z8249 Family history of ischemic heart disease and other diseases of the circulatory system: Secondary | ICD-10-CM | POA: Insufficient documentation

## 2018-12-03 DIAGNOSIS — Z794 Long term (current) use of insulin: Secondary | ICD-10-CM | POA: Insufficient documentation

## 2018-12-03 DIAGNOSIS — I132 Hypertensive heart and chronic kidney disease with heart failure and with stage 5 chronic kidney disease, or end stage renal disease: Secondary | ICD-10-CM | POA: Diagnosis not present

## 2018-12-03 DIAGNOSIS — M1990 Unspecified osteoarthritis, unspecified site: Secondary | ICD-10-CM | POA: Diagnosis not present

## 2018-12-03 DIAGNOSIS — E1140 Type 2 diabetes mellitus with diabetic neuropathy, unspecified: Secondary | ICD-10-CM | POA: Insufficient documentation

## 2018-12-03 DIAGNOSIS — Z833 Family history of diabetes mellitus: Secondary | ICD-10-CM | POA: Insufficient documentation

## 2018-12-03 DIAGNOSIS — Z6841 Body Mass Index (BMI) 40.0 and over, adult: Secondary | ICD-10-CM | POA: Insufficient documentation

## 2018-12-03 DIAGNOSIS — T82838A Hemorrhage of vascular prosthetic devices, implants and grafts, initial encounter: Secondary | ICD-10-CM | POA: Diagnosis not present

## 2018-12-03 HISTORY — PX: IR US GUIDE VASC ACCESS RIGHT: IMG2390

## 2018-12-03 HISTORY — PX: IR THROMBECTOMY AV FISTULA W/THROMBOLYSIS/PTA INC/SHUNT/IMG RIGHT: IMG6119

## 2018-12-03 HISTORY — PX: IR FLUORO GUIDE CV LINE RIGHT: IMG2283

## 2018-12-03 LAB — POCT I-STAT, CHEM 8
BUN: 39 mg/dL — ABNORMAL HIGH (ref 8–23)
Calcium, Ion: 1.2 mmol/L (ref 1.15–1.40)
Chloride: 102 mmol/L (ref 98–111)
Creatinine, Ser: 10.1 mg/dL — ABNORMAL HIGH (ref 0.44–1.00)
Glucose, Bld: 273 mg/dL — ABNORMAL HIGH (ref 70–99)
HCT: 38 % (ref 36.0–46.0)
Hemoglobin: 12.9 g/dL (ref 12.0–15.0)
Potassium: 5.5 mmol/L — ABNORMAL HIGH (ref 3.5–5.1)
Sodium: 134 mmol/L — ABNORMAL LOW (ref 135–145)
TCO2: 24 mmol/L (ref 22–32)

## 2018-12-03 LAB — GLUCOSE, CAPILLARY: Glucose-Capillary: 233 mg/dL — ABNORMAL HIGH (ref 70–99)

## 2018-12-03 MED ORDER — FENTANYL CITRATE (PF) 100 MCG/2ML IJ SOLN
INTRAMUSCULAR | Status: AC
  Filled 2018-12-03: qty 2

## 2018-12-03 MED ORDER — FENTANYL CITRATE (PF) 100 MCG/2ML IJ SOLN
INTRAMUSCULAR | Status: AC | PRN
  Administered 2018-12-03 (×3): 50 ug via INTRAVENOUS

## 2018-12-03 MED ORDER — IOHEXOL 300 MG/ML  SOLN
100.0000 mL | Freq: Once | INTRAMUSCULAR | Status: AC | PRN
  Administered 2018-12-03: 36 mL via INTRAVENOUS

## 2018-12-03 MED ORDER — LIDOCAINE HCL 1 % IJ SOLN
INTRAMUSCULAR | Status: AC
  Filled 2018-12-03: qty 20

## 2018-12-03 MED ORDER — MIDAZOLAM HCL 2 MG/2ML IJ SOLN
INTRAMUSCULAR | Status: AC
  Filled 2018-12-03: qty 2

## 2018-12-03 MED ORDER — ALTEPLASE 2 MG IJ SOLR
INTRAMUSCULAR | Status: AC | PRN
  Administered 2018-12-03: 1 mg
  Administered 2018-12-03: 3 mg

## 2018-12-03 MED ORDER — GELATIN ABSORBABLE 12-7 MM EX MISC
CUTANEOUS | Status: AC
  Filled 2018-12-03: qty 1

## 2018-12-03 MED ORDER — LIDOCAINE HCL 1 % IJ SOLN
INTRAMUSCULAR | Status: AC | PRN
  Administered 2018-12-03: 10 mL
  Administered 2018-12-03: 20 mL

## 2018-12-03 MED ORDER — CEFAZOLIN SODIUM-DEXTROSE 2-4 GM/100ML-% IV SOLN
INTRAVENOUS | Status: AC | PRN
  Administered 2018-12-03: 2 g via INTRAVENOUS

## 2018-12-03 MED ORDER — MIDAZOLAM HCL 2 MG/2ML IJ SOLN
INTRAMUSCULAR | Status: AC | PRN
  Administered 2018-12-03 (×2): 1 mg via INTRAVENOUS
  Administered 2018-12-03: 0.5 mg via INTRAVENOUS

## 2018-12-03 MED ORDER — ALTEPLASE 2 MG IJ SOLR
INTRAMUSCULAR | Status: AC
  Filled 2018-12-03: qty 4

## 2018-12-03 MED ORDER — CEFAZOLIN SODIUM-DEXTROSE 2-4 GM/100ML-% IV SOLN
INTRAVENOUS | Status: AC
  Filled 2018-12-03: qty 100

## 2018-12-03 MED ORDER — HEPARIN SODIUM (PORCINE) 1000 UNIT/ML IJ SOLN
INTRAMUSCULAR | Status: AC | PRN
  Administered 2018-12-03: 3000 [IU] via INTRAVENOUS

## 2018-12-03 MED ORDER — HEPARIN SODIUM (PORCINE) 1000 UNIT/ML IJ SOLN
INTRAMUSCULAR | Status: AC
  Administered 2018-12-03: 3.2 mL
  Filled 2018-12-03: qty 1

## 2018-12-03 NOTE — Sedation Documentation (Signed)
Unable to obtain IV access or lab work on patient. Earleen Newport, MD aware.

## 2018-12-03 NOTE — Procedures (Signed)
Interventional Radiology Procedure Note  Procedure: Placement of a right IJ approach HD catheter. 19cm tip to cuff, tunneled. Tip is positioned at the superior cavoatrial junction and catheter is ready for immediate use.  Complications: None Recommendations:  - Ok to use - Do not submerge - Routine line care   Signed,  Dulcy Fanny. Earleen Newport, DO

## 2018-12-03 NOTE — Discharge Instructions (Addendum)
Central Line Dialysis Access Placement, Care After This sheet gives you information about how to care for yourself after your procedure. Your health care provider may also give you more specific instructions. If you have problems or questions, contact your health care provider. What can I expect after the procedure? After the procedure, it is common to have:  Mild pain or discomfort.  Mild redness, swelling, or bruising around your incision.  A small amount of blood or clear fluid coming from your incision. Follow these instructions at home: Incision care   Follow instructions from your health care provider about how to take care of your incision. Make sure you: ? Wash your hands with soap and water before you change your bandage (dressing). If soap and water are not available, use hand sanitizer. ? Change your dressing as told by your health care provider. ? Leave stitches (sutures) in place.  Check your incision area every day for signs of infection. Check for: ? More redness, swelling, or pain. ? More fluid or blood. ? Warmth. ? Pus or a bad smell.  If directed, put heat on the catheter site as often as told by your health care provider. Use the heat source that your health care provider recommends, such as a moist heat pack or a heating pad. ? Place a towel between your skin and the heat source. ? Leave the heat on for 20-30 minutes. ? Remove the heat if your skin turns bright red. This is especially important if you are unable to feel pain, heat, or cold. You may have a greater risk of getting burned.  If directed, put ice on the catheter site: ? Put ice in a plastic bag. ? Place a towel between your skin and the bag. ? Leave the ice on for 20 minutes, 2-3 times a day. Medicines  Take over-the-counter and prescription medicines only as told by your health care provider.  If you were prescribed an antibiotic medicine, use it as told by your health care provider. Do not stop  using the antibiotic even if you start to feel better. Activity  Return to your normal activities as told by your health care provider. Ask your health care provider what activities are safe for you.  Do not lift anything that is heavier than 10 lb (4.5 kg) until your health care provider says that this is safe. Driving  Do not drive for 24 hours if you were given a medicine to help you relax (sedative) during your procedure.  Do not drive or use heavy machinery while taking prescription pain medicine. Lifestyle  Limit alcohol intake to no more than 1 drink a day for nonpregnant women and 2 drinks a day for men. One drink equals 12 oz of beer, 5 oz of wine, or 1 oz of hard liquor.  Do not use any products that contain nicotine or tobacco, such as cigarettes and e-cigarettes. If you need help quitting, ask your health care provider. General instructions  Do not take baths or showers, swim, or use a hot tub until your health care provider approves. You may only be allowed to take sponge baths for bathing.  Wear compression stockings as told by your health care provider. These stockings help to prevent blood clots and reduce swelling in your legs.  Follow instructions from your health care provider about eating or drinking restrictions.  Keep all follow-up visits as told by your health care provider. This is important. Contact a health care provider if:  Your   catheter gets pulled out of place.  Your catheter site becomes itchy.  You develop a rash around your catheter site.  You have more redness, swelling, or pain around your incision.  You have more fluid or blood coming from your incision.  Your incision area feels warm to the touch.  You have pus or a bad smell coming from your incision.  You have a fever. Get help right away if:  You become light-headed or dizzy.  You faint.  You have difficulty breathing.  Your catheter gets pulled out completely. This  information is not intended to replace advice given to you by your health care provider. Make sure you discuss any questions you have with your health care provider. Document Released: 11/28/2003 Document Revised: 03/28/2017 Document Reviewed: 01/08/2016 Elsevier Patient Education  [place].   Moderate Conscious Sedation, Adult, Care After These instructions provide you with information about caring for yourself after your procedure. Your health care provider may also give you more specific instructions. Your treatment has been planned according to current medical practices, but problems sometimes occur. Call your health care provider if you have any problems or questions after your procedure. What can I expect after the procedure? After your procedure, it is common: To feel sleepy for several hours. To feel clumsy and have poor balance for several hours. To have poor judgment for several hours. To vomit if you eat too soon. Follow these instructions at home: For at least 24 hours after the procedure:  Do not: Participate in activities where you could fall or become injured. Drive. Use heavy machinery. Drink alcohol. Take sleeping pills or medicines that cause drowsiness. Make important decisions or sign legal documents. Take care of children on your own. Rest. Eating and drinking Follow the diet recommended by your health care provider. If you vomit: Drink water, juice, or soup when you can drink without vomiting. Make sure you have little or no nausea before eating solid foods. General instructions Have a responsible adult stay with you until you are awake and alert. Take over-the-counter and prescription medicines only as told by your health care provider. If you smoke, do not smoke without supervision. Keep all follow-up visits as told by your health care provider. This is important. Contact a health care provider if: You keep feeling nauseous or you keep  vomiting. You feel light-headed. You develop a rash. You have a fever. Get help right away if: You have trouble breathing. This information is not intended to replace advice given to you by your health care provider. Make sure you discuss any questions you have with your health care provider. Document Released: 02/03/2013 Document Revised: 03/28/2017 Document Reviewed: 08/05/2015 Elsevier Patient Education  2020 [place]. Dialysis Fistulogram, Care After This sheet gives you information about how to care for yourself after your procedure. Your health care provider may also give you more specific instructions. If you have problems or questions, contact your health care provider. What can I expect after the procedure? After the procedure, it is common to have:  A small amount of discomfort in the area where the small, thin tube (catheter) was placed for the procedure.  A small amount of bruising around the fistula.  Sleepiness and tiredness (fatigue). Follow these instructions at home: Activity   Rest at home and do not lift anything that is heavier than 5 lb (2.3 kg) on the day after your procedure.  Return to your normal activities as told by your health care provider.  Ask your health care provider what activities are safe for you.  Do not drive or use heavy machinery while taking prescription pain medicine.  Do not drive for 24 hours if you were given a medicine to help you relax (sedative) during your procedure. Medicines   Take over-the-counter and prescription medicines only as told by your health care provider. Puncture site care  Follow instructions from your health care provider about how to take care of the site where catheters were inserted. Make sure you: ? Wash your hands with soap and water before you change your bandage (dressing). If soap and water are not available, use hand sanitizer. ? Change your dressing as told by your health care provider. ? Leave  stitches (sutures), skin glue, or adhesive strips in place. These skin closures may need to stay in place for 2 weeks or longer. If adhesive strip edges start to loosen and curl up, you may trim the loose edges. Do not remove adhesive strips completely unless your health care provider tells you to do that.  Check your puncture area every day for signs of infection. Check for: ? Redness, swelling, or pain. ? Fluid or blood. ? Warmth. ? Pus or a bad smell. General instructions  Do not take baths, swim, or use a hot tub until your health care provider approves. Ask your health care provider if you may take showers. You may only be allowed to take sponge baths.  Monitor your dialysis fistula closely. Check to make sure that you can feel a vibration or buzz (a thrill) when you put your fingers over the fistula.  Prevent damage to your graft or fistula: ? Do not wear tight-fitting clothing or jewelry on the arm or leg that has your graft or fistula. ? Tell all your health care providers that you have a dialysis fistula or graft. ? Do not allow blood draws, IVs, or blood pressure readings to be done in the arm that has your fistula or graft. ? Do not allow flu shots or vaccinations in the arm with your fistula or graft.  Keep all follow-up visits as told by your health care provider. This is important. Contact a health care provider if:  You have redness, swelling, or pain at the site where the catheter was put in.  You have fluid or blood coming from the catheter site.  The catheter site feels warm to the touch.  You have pus or a bad smell coming from the catheter site.  You have a fever or chills. Get help right away if:  You feel weak.  You have trouble balancing.  You have trouble moving your arms or legs.  You have problems with your speech or vision.  You can no longer feel a vibration or buzz when you put your fingers over your dialysis fistula.  The limb that was used for  the procedure: ? Swells. ? Is painful. ? Is cold. ? Is discolored, such as blue or pale white.  You have chest pain or shortness of breath. Summary  After a dialysis fistulogram, it is common to have a small amount of discomfort or bruising in the area where the small, thin tube (catheter) was placed.  Rest at home on the day after your procedure. Return to your normal activities as told by your health care provider.  Take over-the-counter and prescription medicines only as told by your health care provider.  Follow instructions from your health care provider about how to take care of  the site where the catheter was inserted.  Keep all follow-up visits as told by your health care provider. This information is not intended to replace advice given to you by your health care provider. Make sure you discuss any questions you have with your health care provider. Document Released: 08/30/2013 Document Revised: 05/16/2017 Document Reviewed: 05/16/2017 Elsevier Patient Education  2020 [employer].

## 2018-12-03 NOTE — H&P (Signed)
Chief Complaint: Patient was seen in consultation today for fistluagram with possible intervention  Referring Physician(s): Fran Lowes  Supervising Physician: Corrie Mckusick  Patient Status: [facility] - Out-pt  History of Present Illness: Christy Zhang is a 63 y.o. female with a past medical history significant for arthritis, anemia, HTN, CHF, DM and ESRD on HD via right upper extremity AVF who presents today for a fistulagram with possible intervention due to excessive post HD bleeding and high venous pressures. Patient reports that she underwent successful HD on Monday (8/3) and presented for HD yesterday (8/5) however she was told that HD could not be completed and that her fistula was clotted after staff listened to her AVF. She states she has had the fistula for about 3 years and may have previously had a similar procedure done but she is unsure what it was or when it was done. She denies any complaints today and is ready to proceed with fistluagram and possible intervention.   Past Medical History:  Diagnosis Date  . Anemia   . Arthritis   . Asthma    in the past  . CHF (congestive heart failure) ([place])   . Chronic kidney disease    Renal failure, dialysis on M/W/F  . 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 End stage renal disease ([place])   . End stage renal disease on dialysis ([place])   . Hypertension   . Neuromuscular disorder ([place])    neuropathy    Past Surgical History:  Procedure Laterality Date  . A/V FISTULAGRAM Right 02/04/2017   Procedure: A/V Fistulagram;  Surgeon: Waynetta Sandy, MD;  Location: [place] [facility];  Service: Cardiovascular;  Laterality: Right;  . AV FISTULA PLACEMENT Right 08/25/2014   Procedure: RIGHT BRACHIOCEPHALIC ARTERIOVENOUS (AV) FISTULA CREATION;  Surgeon: Mal Misty, MD;  Location: [place];  Service: Vascular;  Laterality: Right;  . FISTULA SUPERFICIALIZATION Right 11/03/2014   Procedure: RIGHT UPPER ARM FISTULA  SUPERFICIALIZATION;  Surgeon: Mal Misty, MD;  Location: [place];  Service: Vascular;  Laterality: Right;  . FISTULOGRAM Right 01/16/2016   Procedure: RIGHT UPPER EXTREMITY ARTERIOVENOUS FISTULOGRAM WITH BALLOON ANGIOPLASTY;  Surgeon: Vickie Epley, MD;  Location: [facility];  Service: Vascular;  Laterality: Right;  . INSERTION OF DIALYSIS CATHETER     X4  . LIGATION OF COMPETING BRANCHES OF ARTERIOVENOUS FISTULA Right 11/03/2014   Procedure: LIGATION OF COMPETING BRANCHE x1 OF RIGHT UPPER ARM ARTERIOVENOUS FISTULA;  Surgeon: Mal Misty, MD;  Location: [place];  Service: Vascular;  Laterality: Right;  . PERIPHERAL VASCULAR BALLOON ANGIOPLASTY Right 02/04/2017   Procedure: PERIPHERAL VASCULAR BALLOON ANGIOPLASTY;  Surgeon: Waynetta Sandy, MD;  Location: [place] [facility];  Service: Cardiovascular;  Laterality: Right;  . TUBAL LIGATION      Allergies: Patient has no known allergies.  Medications: Prior to Admission medications   Medication Sig Start Date End Date Taking? Authorizing Provider  amLODipine (NORVASC) 5 MG tablet Take 5 mg by mouth daily. 06/19/18  Yes [provider]  atorvastatin (LIPITOR) 40 MG tablet Take 1 tablet (40 mg total) by mouth daily. 07/14/18  Yes [name], [name], MD  Dulaglutide (TRULICITY) 1.5 PQ/3.3AQ SOPN Inject content of one pen under the skin weekly on Saturday 10/20/18  Yes [name], [name], MD  fluocinonide-emollient (LIDEX-E) 0.05 % cream Apply 1 application topically 2 (two) times daily. To affected areas 09/08/18  Yes [name], [name], MD  Insulin Glargine, 1 Unit Dial, (TOUJEO SOLOSTAR) 300  UNIT/ML SOPN Inject 90 Units into the skin daily. 09/08/18  Yes Claretta Fraise, MD  metoprolol succinate (TOPROL-XL) 100 MG 24 hr tablet Take 1 tablet (100 mg total) by mouth daily. Take with or immediately following a meal. 07/14/18  Yes [name], [name], MD  sevelamer carbonate (RENVELA) 800 MG tablet Take 800 mg by mouth 3 (three) times daily with meals. Takes  3 pills with meals, takes 2 pills with snacks   Yes [provider]  acetaminophen (TYLENOL) 500 MG tablet Take 500 mg by mouth every 8 (eight) hours as needed for mild pain.    [provider]  Blood Glucose Monitoring Suppl (BLOOD GLUCOSE MONITOR SYSTEM) w/Device KIT Use to check Blood Sugar up to 4 x daily as directed. Dispense based on pt insurance. E11.9 06/19/17   Claretta Fraise, MD  diphenhydrAMINE (BENADRYL) 25 mg capsule Take 25 mg by mouth every 8 (eight) hours as needed for itching.    [provider]  glucose blood test strip Use to test blood sugars QID 06/19/17   Claretta Fraise, MD  Lancets Misc. KIT 1 each by Does not apply route 4 (four) times daily as needed. 06/19/17   Claretta Fraise, MD     Family History  Problem Relation Age of Onset  . Diabetes Mother   . Hypertension Mother   . Cancer Father   . Diabetes Father   . Hypertension Sister   . Diabetes Daughter   . Heart disease Daughter   . Hypertension Daughter   . Diabetes Son   . Heart disease Son   . Hypertension Son   . Peripheral vascular disease Son        amputation    Social History   Socioeconomic History  . Marital status: Married    Spouse name: Not on file  . Number of children: Not on file  . Years of education: Not on file  . Highest education level: Not on file  Occupational History  . Not on file  Social Needs  . Financial resource strain: Not on file  . Food insecurity    Worry: Not on file    Inability: Not on file  . Transportation needs    Medical: Not on file    Non-medical: Not on file  Tobacco Use  . Smoking status: Former Smoker    Types: Cigarettes    Quit date: 07/25/1984    Years since quitting: 34.3  . Smokeless tobacco: Never Used  Substance and Sexual Activity  . Alcohol use: No    Alcohol/week: 0.0 standard drinks  . Drug use: No  . Sexual activity: Not on file  Lifestyle  . Physical activity    Days per week: Not on file    Minutes per  session: Not on file  . Stress: Not on file  Relationships  . Social Herbalist on phone: Not on file    Gets together: Not on file    Attends religious service: Not on file    Active member of club or organization: Not on file    Attends meetings of clubs or organizations: Not on file    Relationship status: Not on file  Other Topics Concern  . Not on file  Social History Narrative  . Not on file     Review of Systems: A 12 point ROS discussed and pertinent positives are indicated in the HPI above.  All other systems are negative.  Review of Systems  Constitutional: Negative  for chills and fever.  HENT: Negative for nosebleeds.   Respiratory: Negative for cough and shortness of breath.   Cardiovascular: Negative for chest pain.  Gastrointestinal: Negative for abdominal pain, blood in stool, diarrhea, nausea and vomiting.  Genitourinary: Negative for hematuria.  Musculoskeletal: Negative for back pain.  Skin: Negative for rash and wound.  Neurological: Negative for dizziness, syncope and headaches.    Vital Signs: BP (!) 194/85   Pulse 77   Temp 98 F (36.7 C) (Temporal)   Resp 20   Ht _0  (1.626 m)   Wt 270 lb (122.5 kg)   SpO2 99%   BMI 46.35 kg/m   Physical Exam Vitals signs reviewed.  Constitutional:      General: She is not in acute distress.    Appearance: She is obese.  HENT:     Head: Normocephalic.     Mouth/Throat:     Mouth: Mucous membranes are moist.     Pharynx: Oropharynx is clear. No oropharyngeal exudate or posterior oropharyngeal erythema.  Cardiovascular:     Rate and Rhythm: Normal rate and regular rhythm.     Comments: (+) RUE AVF - without audible bruit on auscultation. No palpable thrill on proximal 2/3 of AVF, distal most portion with bounding pulse. No open wounds, bleeding, drainage, erythema, edema or pain to palpation. Pulmonary:     Effort: Pulmonary effort is normal.     Breath sounds: Normal breath sounds.   Abdominal:     General: Bowel sounds are normal. There is no distension.     Palpations: Abdomen is soft.     Tenderness: There is no abdominal tenderness.  Skin:    General: Skin is warm.  Neurological:     Mental Status: She is alert and oriented to person, place, and time.  Psychiatric:        Mood and Affect: Mood normal.        Behavior: Behavior normal.        Thought Content: Thought content normal.        Judgment: Judgment normal.      MD Evaluation Airway: WNL Heart: WNL Abdomen: WNL Chest/ Lungs: WNL ASA  Classification: 3 Mallampati/Airway Score: One   Imaging: No results found.  Labs:  CBC: Recent Labs    07/14/18 1153  WBC 11.4*  HGB 11.0*  HCT 35.9  PLT 568*    COAGS: No results for input(s): INR, APTT in the last 8760 hours.  BMP: Recent Labs    07/14/18 1153 09/08/18 0903  NA 139 137  K 4.8 4.7  CL 94* 92*  CO2 23 22  GLUCOSE 184* 245*  BUN 21 27  CALCIUM 9.4 9.7  CREATININE 5.57* 6.26*  GFRNONAA 8* 7*  GFRAA 9* 8*    LIVER FUNCTION TESTS: Recent Labs    07/14/18 1153  BILITOT 0.5  AST 9  ALT 9  ALKPHOS 76  PROT 8.0  ALBUMIN 4.5    TUMOR MARKERS: No results for input(s): AFPTM, CEA, CA199, CHROMGRNA in the last 8760 hours.  Assessment and Plan:  63 y/o F with history of ESRD on HD MWF via RUE AVF who presents today for fistulagram with possible intervention due to recent issues with post HD bleeding, high pressures during HD and as of yesterday no audible bruit. Last successful HD treatment Monday (8/3) - next planned HD tomorrow (8/7).  Patient has been NPO since 9 pm yesterday, she does not take blood thinning medications, she did not take  any medications this morning. Afebrile, pre-procedure labs pending at time of this note writing.  Risks and benefits discussed with the patient including, but not limited to bleeding, infection, vascular injury, pulmonary embolism, need for tunneled HD catheter placement or even  death.  All of the patient's questions were answered, patient is agreeable to proceed.  Consent signed and in chart.  Thank you for this interesting consult.  I greatly enjoyed meeting [facility] and look forward to participating in their care.  A copy of this report was sent to the requesting provider on this date.  Electronically Signed: Joaquim Nam, PA-C 12/03/2018, 9:52 AM   I spent a total of 20 Minutesin face to face in clinical consultation, greater than 50% of which was counseling/coordinating care for fistulagram with possible intervention.

## 2018-12-03 NOTE — Procedures (Signed)
Interventional Radiology Procedure Note  Procedure: Attempt at declot unsuccessful. 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   No meaningful flow after attempt.  Will proceed with catheter placement.    Complications: None  Recommendations:  - Will proceed with HD catheter. -stay sutures may be removed at HD in 24-48 hours  Signed,  Dulcy Fanny. Earleen Newport, DO

## 2018-12-04 ENCOUNTER — Other Ambulatory Visit (HOSPITAL_COMMUNITY)
Admission: RE | Admit: 2018-12-04 | Discharge: 2018-12-04 | Disposition: A | Discharge status: Home / Self Care | Payer: Medicare Other | Source: Ambulatory Visit | Attending: Surgery | Admitting: Surgery

## 2018-12-04 DIAGNOSIS — Z01812 Encounter for preprocedural laboratory examination: Secondary | ICD-10-CM | POA: Diagnosis not present

## 2018-12-04 DIAGNOSIS — N2581 Secondary hyperparathyroidism of renal origin: Secondary | ICD-10-CM | POA: Diagnosis not present

## 2018-12-04 DIAGNOSIS — Z20828 Contact with and (suspected) exposure to other viral communicable diseases: Secondary | ICD-10-CM | POA: Insufficient documentation

## 2018-12-04 DIAGNOSIS — D631 Anemia in chronic kidney disease: Secondary | ICD-10-CM | POA: Diagnosis not present

## 2018-12-04 DIAGNOSIS — N186 End stage renal disease: Secondary | ICD-10-CM | POA: Diagnosis not present

## 2018-12-04 DIAGNOSIS — D509 Iron deficiency anemia, unspecified: Secondary | ICD-10-CM | POA: Diagnosis not present

## 2018-12-04 DIAGNOSIS — Z992 Dependence on renal dialysis: Secondary | ICD-10-CM | POA: Diagnosis not present

## 2018-12-05 LAB — SARS CORONAVIRUS 2 (TAT 6-24 HRS): SARS Coronavirus 2: NEGATIVE

## 2018-12-07 DIAGNOSIS — Z992 Dependence on renal dialysis: Secondary | ICD-10-CM | POA: Diagnosis not present

## 2018-12-07 DIAGNOSIS — D631 Anemia in chronic kidney disease: Secondary | ICD-10-CM | POA: Diagnosis not present

## 2018-12-07 DIAGNOSIS — N186 End stage renal disease: Secondary | ICD-10-CM | POA: Diagnosis not present

## 2018-12-07 DIAGNOSIS — N2581 Secondary hyperparathyroidism of renal origin: Secondary | ICD-10-CM | POA: Diagnosis not present

## 2018-12-07 DIAGNOSIS — D509 Iron deficiency anemia, unspecified: Secondary | ICD-10-CM | POA: Diagnosis not present

## 2018-12-08 ENCOUNTER — Ambulatory Visit (HOSPITAL_COMMUNITY)
Admission: RE | Admit: 2018-12-08 | Discharge: 2018-12-08 | Disposition: A | Discharge status: Home / Self Care | Payer: Medicare Other | Attending: Surgery | Admitting: Surgery

## 2018-12-08 ENCOUNTER — Encounter (HOSPITAL_COMMUNITY)
Admission: RE | Disposition: A | Discharge status: Home / Self Care | Payer: Self-pay | Source: Home / Self Care | Attending: Surgery

## 2018-12-08 ENCOUNTER — Other Ambulatory Visit: Payer: Self-pay

## 2018-12-08 ENCOUNTER — Ambulatory Visit (HOSPITAL_BASED_OUTPATIENT_CLINIC_OR_DEPARTMENT_OTHER): Payer: Medicare Other

## 2018-12-08 DIAGNOSIS — N186 End stage renal disease: Secondary | ICD-10-CM

## 2018-12-08 DIAGNOSIS — Z01818 Encounter for other preprocedural examination: Secondary | ICD-10-CM | POA: Diagnosis not present

## 2018-12-08 LAB — POCT I-STAT, CHEM 8
BUN: 25 mg/dL — ABNORMAL HIGH (ref 8–23)
Calcium, Ion: 1.09 mmol/L — ABNORMAL LOW (ref 1.15–1.40)
Chloride: 98 mmol/L (ref 98–111)
Creatinine, Ser: 6.8 mg/dL — ABNORMAL HIGH (ref 0.44–1.00)
Glucose, Bld: 266 mg/dL — ABNORMAL HIGH (ref 70–99)
HCT: 38 % (ref 36.0–46.0)
Hemoglobin: 12.9 g/dL (ref 12.0–15.0)
Potassium: 4.6 mmol/L (ref 3.5–5.1)
Sodium: 135 mmol/L (ref 135–145)
TCO2: 27 mmol/L (ref 22–32)

## 2018-12-08 LAB — GLUCOSE, CAPILLARY: Glucose-Capillary: 235 mg/dL — ABNORMAL HIGH (ref 70–99)

## 2018-12-08 SURGERY — A/V FISTULAGRAM
Anesthesia: LOCAL

## 2018-12-08 MED ORDER — SODIUM CHLORIDE 0.9% FLUSH: 3.0000 mL | Freq: Two times a day (BID) | INTRAVENOUS | Status: DC

## 2018-12-08 MED ORDER — SODIUM CHLORIDE 0.9% FLUSH: 3.0000 mL | INTRAVENOUS | Status: DC | PRN

## 2018-12-08 MED ORDER — SODIUM CHLORIDE 0.9 % IV SOLN: 250.0000 mL | INTRAVENOUS | Status: DC | PRN

## 2018-12-08 NOTE — Progress Notes (Addendum)
Called cath lab/ Ginnie Smart in place of [place] rn , unable to get IV, I was able to get blood for labs, Dr Trula Slade aware need H&p, no IV access is ok, will use fistula, aware of CBG 266 1200, dc patient after vein mapping/ no procedure done, Dr Trula Slade to call patient at home later, pt was informed.

## 2018-12-08 NOTE — Progress Notes (Signed)
UE vein mapping       has been completed. Preliminary results can be found under CV proc through chart review. Lorell Thibodaux, BS, RDMS, RVT   

## 2018-12-09 ENCOUNTER — Telehealth: Payer: Self-pay | Admitting: Surgery

## 2018-12-09 DIAGNOSIS — N2581 Secondary hyperparathyroidism of renal origin: Secondary | ICD-10-CM | POA: Diagnosis not present

## 2018-12-09 DIAGNOSIS — N186 End stage renal disease: Secondary | ICD-10-CM | POA: Diagnosis not present

## 2018-12-09 DIAGNOSIS — Z992 Dependence on renal dialysis: Secondary | ICD-10-CM | POA: Diagnosis not present

## 2018-12-09 DIAGNOSIS — D509 Iron deficiency anemia, unspecified: Secondary | ICD-10-CM | POA: Diagnosis not present

## 2018-12-09 DIAGNOSIS — D631 Anemia in chronic kidney disease: Secondary | ICD-10-CM | POA: Diagnosis not present

## 2018-12-09 NOTE — Telephone Encounter (Signed)
The patient was scheduled for me to perform a fistulogram.  However it was not known that she underwent an attempt at thrombo-lysis by radiology last week.  The fistula was not able to be opened and so a catheter was placed.  When I examined her, her fistula was occluded.  I did not think that it was salvageable.  I had her vein mapped.  She appears to have an adequate right basilic vein and so her next operation will be a first stage right basilic vein fistula creation.  Christy Zhang

## 2018-12-10 ENCOUNTER — Telehealth: Payer: Self-pay | Admitting: *Deleted

## 2018-12-10 NOTE — Telephone Encounter (Signed)
After many failed attempts to get patient on the phone, called and spoke with Caryl Pina at [facility]. To call this office ASAP to schedule First Stage BVT. To speak with patient about necessity for surgery.

## 2018-12-11 DIAGNOSIS — D509 Iron deficiency anemia, unspecified: Secondary | ICD-10-CM | POA: Diagnosis not present

## 2018-12-11 DIAGNOSIS — N2581 Secondary hyperparathyroidism of renal origin: Secondary | ICD-10-CM | POA: Diagnosis not present

## 2018-12-11 DIAGNOSIS — D631 Anemia in chronic kidney disease: Secondary | ICD-10-CM | POA: Diagnosis not present

## 2018-12-11 DIAGNOSIS — N186 End stage renal disease: Secondary | ICD-10-CM | POA: Diagnosis not present

## 2018-12-11 DIAGNOSIS — Z992 Dependence on renal dialysis: Secondary | ICD-10-CM | POA: Diagnosis not present

## 2018-12-14 ENCOUNTER — Telehealth: Payer: Self-pay | Admitting: *Deleted

## 2018-12-14 DIAGNOSIS — D509 Iron deficiency anemia, unspecified: Secondary | ICD-10-CM | POA: Diagnosis not present

## 2018-12-14 DIAGNOSIS — Z992 Dependence on renal dialysis: Secondary | ICD-10-CM | POA: Diagnosis not present

## 2018-12-14 DIAGNOSIS — N2581 Secondary hyperparathyroidism of renal origin: Secondary | ICD-10-CM | POA: Diagnosis not present

## 2018-12-14 DIAGNOSIS — N186 End stage renal disease: Secondary | ICD-10-CM | POA: Diagnosis not present

## 2018-12-14 DIAGNOSIS — D631 Anemia in chronic kidney disease: Secondary | ICD-10-CM | POA: Diagnosis not present

## 2018-12-14 NOTE — Telephone Encounter (Signed)
Message left with patient's daughter and on patient's cell phone to call this office to schedule surgery for HD access.

## 2018-12-14 NOTE — Telephone Encounter (Signed)
Message left with patient's da

## 2018-12-16 ENCOUNTER — Encounter: Payer: Self-pay | Admitting: *Deleted

## 2018-12-16 ENCOUNTER — Other Ambulatory Visit: Payer: Self-pay | Admitting: *Deleted

## 2018-12-16 DIAGNOSIS — N186 End stage renal disease: Secondary | ICD-10-CM | POA: Diagnosis not present

## 2018-12-16 DIAGNOSIS — Z992 Dependence on renal dialysis: Secondary | ICD-10-CM | POA: Diagnosis not present

## 2018-12-16 DIAGNOSIS — N2581 Secondary hyperparathyroidism of renal origin: Secondary | ICD-10-CM | POA: Diagnosis not present

## 2018-12-16 DIAGNOSIS — D631 Anemia in chronic kidney disease: Secondary | ICD-10-CM | POA: Diagnosis not present

## 2018-12-16 DIAGNOSIS — D509 Iron deficiency anemia, unspecified: Secondary | ICD-10-CM | POA: Diagnosis not present

## 2018-12-16 NOTE — Progress Notes (Signed)
Surgery scheduled with patient. Reviewed pre-op instructions with patient and faxed pre-op letter to [facility] for patient. Nasal swab scheduled. Verbalized understanding.

## 2018-12-18 DIAGNOSIS — D631 Anemia in chronic kidney disease: Secondary | ICD-10-CM | POA: Diagnosis not present

## 2018-12-18 DIAGNOSIS — Z992 Dependence on renal dialysis: Secondary | ICD-10-CM | POA: Diagnosis not present

## 2018-12-18 DIAGNOSIS — N2581 Secondary hyperparathyroidism of renal origin: Secondary | ICD-10-CM | POA: Diagnosis not present

## 2018-12-18 DIAGNOSIS — N186 End stage renal disease: Secondary | ICD-10-CM | POA: Diagnosis not present

## 2018-12-18 DIAGNOSIS — D509 Iron deficiency anemia, unspecified: Secondary | ICD-10-CM | POA: Diagnosis not present

## 2018-12-21 ENCOUNTER — Other Ambulatory Visit: Payer: Self-pay

## 2018-12-21 ENCOUNTER — Other Ambulatory Visit (HOSPITAL_COMMUNITY)
Admission: RE | Admit: 2018-12-21 | Discharge: 2018-12-21 | Disposition: A | Discharge status: Home / Self Care | Payer: Medicare Other | Source: Ambulatory Visit | Attending: Surgery | Admitting: Surgery

## 2018-12-21 DIAGNOSIS — Z01812 Encounter for preprocedural laboratory examination: Secondary | ICD-10-CM | POA: Insufficient documentation

## 2018-12-21 DIAGNOSIS — Z20828 Contact with and (suspected) exposure to other viral communicable diseases: Secondary | ICD-10-CM | POA: Insufficient documentation

## 2018-12-21 DIAGNOSIS — N186 End stage renal disease: Secondary | ICD-10-CM | POA: Diagnosis not present

## 2018-12-21 DIAGNOSIS — N2581 Secondary hyperparathyroidism of renal origin: Secondary | ICD-10-CM | POA: Diagnosis not present

## 2018-12-21 DIAGNOSIS — D631 Anemia in chronic kidney disease: Secondary | ICD-10-CM | POA: Diagnosis not present

## 2018-12-21 DIAGNOSIS — D509 Iron deficiency anemia, unspecified: Secondary | ICD-10-CM | POA: Diagnosis not present

## 2018-12-21 DIAGNOSIS — Z992 Dependence on renal dialysis: Secondary | ICD-10-CM | POA: Diagnosis not present

## 2018-12-22 LAB — SARS CORONAVIRUS 2 (TAT 6-24 HRS): SARS Coronavirus 2: NEGATIVE

## 2018-12-23 ENCOUNTER — Other Ambulatory Visit: Payer: Self-pay

## 2018-12-23 ENCOUNTER — Encounter (HOSPITAL_COMMUNITY): Payer: Self-pay | Admitting: *Deleted

## 2018-12-23 DIAGNOSIS — Z992 Dependence on renal dialysis: Secondary | ICD-10-CM | POA: Diagnosis not present

## 2018-12-23 DIAGNOSIS — N186 End stage renal disease: Secondary | ICD-10-CM | POA: Diagnosis not present

## 2018-12-23 DIAGNOSIS — N2581 Secondary hyperparathyroidism of renal origin: Secondary | ICD-10-CM | POA: Diagnosis not present

## 2018-12-23 DIAGNOSIS — D631 Anemia in chronic kidney disease: Secondary | ICD-10-CM | POA: Diagnosis not present

## 2018-12-23 DIAGNOSIS — D509 Iron deficiency anemia, unspecified: Secondary | ICD-10-CM | POA: Diagnosis not present

## 2018-12-23 MED ORDER — DEXTROSE 5 % IV SOLN
3.0000 g | INTRAVENOUS | Status: AC
  Administered 2018-12-24: 3 g via INTRAVENOUS
  Filled 2018-12-23: qty 3

## 2018-12-23 NOTE — Progress Notes (Signed)
Pt denies SOB, chest pain, and being under the care of a cardiologist. Pt stated that she is under the care of Dr. Claretta Fraise, PCP. Pt denies having a cardiac cath but stated that a stress test was performed 10 years ago. Nurse requested LOV note, echo and all cardiac studies from [facility]; awaiting response. Pt made aware that pre-op instructions will be faxed to [facility] [place]. [place], AA confirmed receipt of fax and stated that she would review pre-op  instructions with pt.

## 2018-12-23 NOTE — Pre-Procedure Instructions (Signed)
Seanne Fetters [facility]  12/23/2018     [facility] [address], [place] [address] Phone: [phone] Fax: [phone]   Your procedure is scheduled on Thursday, December 24, 2018  Report to [place] at 7:15 A.M.  Call this number if you have problems the morning of surgery:  [phone]   Remember: Brush your teeth the morning of surgery with your regular toothpaste.   Do not eat or drink after midnight.   Take these medicines the morning of surgery with A SIP OF WATER : amLODipine (NORVASC), atorvastatin (LIPITOR), metoprolol succinate (TOPROL-XL) If needed: acetaminophen (TYLENOL) for pain or headache If needed:diphenhydrAMINE (BENADRYL) for allergies  Stop taking  vitamins, fish oil and herbal medications. Do not take any NSAIDs ie: Ibuprofen, Advil, Naproxen (Aleve), Motrin, BC and Goody Powder; stop now.     How to Manage Your Diabetes Before and After Surgery  Why is it important to control my blood sugar before and after surgery? . Improving blood sugar levels before and after surgery helps healing and can limit problems. . A way of improving blood sugar control is eating a healthy diet by: o  Eating less sugar and carbohydrates o  Increasing activity/exercise o  Talking with your doctor about reaching your blood sugar goals . High blood sugars (greater than 180 mg/dL) can raise your risk of infections and slow your recovery, so you will need to focus on controlling your diabetes during the weeks before surgery. . Make sure that the doctor who takes care of your diabetes knows about your planned surgery including the date and location.  How do I manage my blood sugar before surgery? . Check your blood sugar at least 4 times a day, starting 2 days before surgery, to make sure that the level is not too high or low. o Check your blood sugar the morning of your surgery when you wake up and every 2 hours  until you get to the Short Stay unit. . If your blood sugar is less than 70 mg/dL, you will need to treat for low blood sugar: o Do not take insulin. o Treat a low blood sugar (less than 70 mg/dL) with  cup of clear juice (cranberry or apple), 4 glucose tablets, OR glucose gel. Recheck blood sugar in 15 minutes after treatment (to make sure it is greater than 70 mg/dL). If your blood sugar is not greater than 70 mg/dL on recheck, call [phone] o  for further instructions. . Report your blood sugar to the short stay nurse when you get to Short Stay.  . If you are admitted to the hospital after surgery: o Your blood sugar will be checked by the staff and you will probably be given insulin after surgery (instead of oral diabetes medicines) to make sure you have good blood sugar levels. o The goal for blood sugar control after surgery is 80-180 mg/dL  WHAT DO I DO ABOUT MY DIABETES MEDICATION?  . THE MORNING OF SURGERY, take  45 units of  Insulin Glargine,  (TOUJEO SOLOSTAR)    . The day of surgery, do not take other diabetes injectables, including Byetta (exenatide), Bydureon (exenatide ER), Victoza (liraglutide), or Trulicity (dulaglutide).  Reviewed and Endorsed by [facility] Patient Education Committee, August 2015  Do not wear jewelry, make-up or nail polish.  Do not wear lotions, powders, or perfumes, or deodorant.  Do not shave 48 hours prior to surgery.  Do not bring valuables to the hospital.  [facility] is not responsible for any belongings or valuables.  Contacts, dentures or bridgework may not be worn into surgery.  Patients discharged the day of surgery will not be allowed to drive home.  Please read over the following fact sheets that you were given.

## 2018-12-24 ENCOUNTER — Ambulatory Visit (HOSPITAL_COMMUNITY): Payer: Medicare Other | Admitting: Certified Registered"

## 2018-12-24 ENCOUNTER — Encounter (HOSPITAL_COMMUNITY): Payer: Self-pay

## 2018-12-24 ENCOUNTER — Encounter (HOSPITAL_COMMUNITY)
Admission: RE | Disposition: A | Discharge status: Home / Self Care | Payer: Self-pay | Source: Home / Self Care | Attending: Surgery

## 2018-12-24 ENCOUNTER — Ambulatory Visit (HOSPITAL_COMMUNITY)
Admission: RE | Admit: 2018-12-24 | Discharge: 2018-12-24 | Disposition: A | Discharge status: Home / Self Care | Payer: Medicare Other | Attending: Surgery | Admitting: Surgery

## 2018-12-24 DIAGNOSIS — I132 Hypertensive heart and chronic kidney disease with heart failure and with stage 5 chronic kidney disease, or end stage renal disease: Secondary | ICD-10-CM | POA: Diagnosis not present

## 2018-12-24 DIAGNOSIS — Z6841 Body Mass Index (BMI) 40.0 and over, adult: Secondary | ICD-10-CM | POA: Insufficient documentation

## 2018-12-24 DIAGNOSIS — M1990 Unspecified osteoarthritis, unspecified site: Secondary | ICD-10-CM | POA: Insufficient documentation

## 2018-12-24 DIAGNOSIS — E1122 Type 2 diabetes mellitus with diabetic chronic kidney disease: Secondary | ICD-10-CM | POA: Diagnosis not present

## 2018-12-24 DIAGNOSIS — N186 End stage renal disease: Secondary | ICD-10-CM | POA: Insufficient documentation

## 2018-12-24 DIAGNOSIS — J45909 Unspecified asthma, uncomplicated: Secondary | ICD-10-CM | POA: Insufficient documentation

## 2018-12-24 DIAGNOSIS — Z992 Dependence on renal dialysis: Secondary | ICD-10-CM | POA: Diagnosis not present

## 2018-12-24 DIAGNOSIS — Z87891 Personal history of nicotine dependence: Secondary | ICD-10-CM | POA: Insufficient documentation

## 2018-12-24 DIAGNOSIS — I5030 Unspecified diastolic (congestive) heart failure: Secondary | ICD-10-CM | POA: Insufficient documentation

## 2018-12-24 DIAGNOSIS — G709 Myoneural disorder, unspecified: Secondary | ICD-10-CM | POA: Insufficient documentation

## 2018-12-24 DIAGNOSIS — I509 Heart failure, unspecified: Secondary | ICD-10-CM | POA: Diagnosis not present

## 2018-12-24 DIAGNOSIS — N185 Chronic kidney disease, stage 5: Secondary | ICD-10-CM | POA: Diagnosis not present

## 2018-12-24 HISTORY — PX: BASCILIC VEIN TRANSPOSITION: SHX5742

## 2018-12-24 LAB — GLUCOSE, CAPILLARY
Glucose-Capillary: 241 mg/dL — ABNORMAL HIGH (ref 70–99)
Glucose-Capillary: 204 mg/dL — ABNORMAL HIGH (ref 70–99)
Glucose-Capillary: 182 mg/dL — ABNORMAL HIGH (ref 70–99)

## 2018-12-24 LAB — POCT I-STAT 4, (NA,K, GLUC, HGB,HCT)
Glucose, Bld: 253 mg/dL — ABNORMAL HIGH (ref 70–99)
HCT: 36 % (ref 36.0–46.0)
Hemoglobin: 12.2 g/dL (ref 12.0–15.0)
Potassium: 3.8 mmol/L (ref 3.5–5.1)
Sodium: 138 mmol/L (ref 135–145)

## 2018-12-24 SURGERY — TRANSPOSITION, VEIN, BASILIC
Anesthesia: Monitor Anesthesia Care | Site: Arm Upper | Laterality: Right

## 2018-12-24 MED ORDER — GLYCOPYRROLATE PF 0.2 MG/ML IJ SOSY
PREFILLED_SYRINGE | INTRAMUSCULAR | Status: AC
  Filled 2018-12-24: qty 1

## 2018-12-24 MED ORDER — CHLORHEXIDINE GLUCONATE 4 % EX LIQD: 60.0000 mL | Freq: Once | CUTANEOUS | Status: DC

## 2018-12-24 MED ORDER — PROPOFOL 1000 MG/100ML IV EMUL
INTRAVENOUS | Status: AC
  Filled 2018-12-24: qty 100

## 2018-12-24 MED ORDER — PROPOFOL 500 MG/50ML IV EMUL
INTRAVENOUS | Status: DC | PRN
  Administered 2018-12-24: 75 ug/kg/min via INTRAVENOUS

## 2018-12-24 MED ORDER — MIDAZOLAM HCL 2 MG/2ML IJ SOLN
INTRAMUSCULAR | Status: AC
  Filled 2018-12-24: qty 2

## 2018-12-24 MED ORDER — SODIUM CHLORIDE 0.9 % IV SOLN
INTRAVENOUS | Status: AC
  Filled 2018-12-24: qty 1.2

## 2018-12-24 MED ORDER — MIDAZOLAM HCL 5 MG/5ML IJ SOLN
INTRAMUSCULAR | Status: DC | PRN
  Administered 2018-12-24: 2 mg via INTRAVENOUS

## 2018-12-24 MED ORDER — EPHEDRINE 5 MG/ML INJ
INTRAVENOUS | Status: AC
  Filled 2018-12-24: qty 10

## 2018-12-24 MED ORDER — LIDOCAINE-EPINEPHRINE (PF) 1 %-1:200000 IJ SOLN
INTRAMUSCULAR | Status: AC
  Filled 2018-12-24: qty 30

## 2018-12-24 MED ORDER — SODIUM CHLORIDE 0.9 % IV SOLN
INTRAVENOUS | Status: DC | PRN
  Administered 2018-12-24: 25 ug/min via INTRAVENOUS

## 2018-12-24 MED ORDER — FENTANYL CITRATE (PF) 100 MCG/2ML IJ SOLN
INTRAMUSCULAR | Status: DC | PRN
  Administered 2018-12-24 (×2): 50 ug via INTRAVENOUS

## 2018-12-24 MED ORDER — LIDOCAINE-EPINEPHRINE (PF) 1 %-1:200000 IJ SOLN
INTRAMUSCULAR | Status: DC | PRN
  Administered 2018-12-24: 30 mL

## 2018-12-24 MED ORDER — SODIUM CHLORIDE 0.9 % IV SOLN
INTRAVENOUS | Status: DC | PRN
  Administered 2018-12-24: 500 mL

## 2018-12-24 MED ORDER — OXYCODONE-ACETAMINOPHEN 5-325 MG PO TABS: 1.0000 | ORAL_TABLET | Freq: Four times a day (QID) | ORAL | 0 refills | Status: DC | PRN

## 2018-12-24 MED ORDER — LIDOCAINE 2% (20 MG/ML) 5 ML SYRINGE
INTRAMUSCULAR | Status: DC | PRN
  Administered 2018-12-24: 20 mg via INTRAVENOUS
  Administered 2018-12-24: 40 mg via INTRAVENOUS
  Administered 2018-12-24: 80 mg via INTRAVENOUS

## 2018-12-24 MED ORDER — SODIUM CHLORIDE 0.9 % IV SOLN
INTRAVENOUS | Status: DC
  Administered 2018-12-24: 08:00:00 via INTRAVENOUS

## 2018-12-24 MED ORDER — LIDOCAINE 2% (20 MG/ML) 5 ML SYRINGE
INTRAMUSCULAR | Status: AC
  Filled 2018-12-24: qty 5

## 2018-12-24 MED ORDER — PROPOFOL 10 MG/ML IV BOLUS
INTRAVENOUS | Status: AC
  Filled 2018-12-24: qty 20

## 2018-12-24 MED ORDER — PROPOFOL 10 MG/ML IV BOLUS
INTRAVENOUS | Status: DC | PRN
  Administered 2018-12-24: 20 mg via INTRAVENOUS

## 2018-12-24 MED ORDER — GLYCOPYRROLATE PF 0.2 MG/ML IJ SOSY
PREFILLED_SYRINGE | INTRAMUSCULAR | Status: DC | PRN
  Administered 2018-12-24: .2 mg via INTRAVENOUS

## 2018-12-24 MED ORDER — 0.9 % SODIUM CHLORIDE (POUR BTL) OPTIME
TOPICAL | Status: DC | PRN
  Administered 2018-12-24: 1000 mL

## 2018-12-24 MED ORDER — LIDOCAINE 2% (20 MG/ML) 5 ML SYRINGE
INTRAMUSCULAR | Status: AC
  Filled 2018-12-24: qty 10

## 2018-12-24 MED ORDER — FENTANYL CITRATE (PF) 250 MCG/5ML IJ SOLN
INTRAMUSCULAR | Status: AC
  Filled 2018-12-24: qty 5

## 2018-12-24 SURGICAL SUPPLY — 37 items
ARMBAND PINK RESTRICT EXTREMIT (MISCELLANEOUS) ×3 IMPLANT
CANISTER SUCT 3000ML PPV (MISCELLANEOUS) ×3 IMPLANT
CLIP VESOCCLUDE MED 24/CT (CLIP) IMPLANT
CLIP VESOCCLUDE MED 6/CT (CLIP) ×2 IMPLANT
CLIP VESOCCLUDE SM WIDE 24/CT (CLIP) IMPLANT
CLIP VESOCCLUDE SM WIDE 6/CT (CLIP) ×2 IMPLANT
COVER PROBE W GEL 5X96 (DRAPES) ×3 IMPLANT
COVER WAND RF STERILE (DRAPES) ×3 IMPLANT
DERMABOND ADVANCED (GAUZE/BANDAGES/DRESSINGS) ×2
DERMABOND ADVANCED .7 DNX12 (GAUZE/BANDAGES/DRESSINGS) ×1 IMPLANT
ELECT REM PT RETURN 9FT ADLT (ELECTROSURGICAL) ×3
ELECTRODE REM PT RTRN 9FT ADLT (ELECTROSURGICAL) ×1 IMPLANT
GLOVE BIOGEL PI IND STRL 7.5 (GLOVE) ×1 IMPLANT
GLOVE BIOGEL PI IND STRL 8 (GLOVE) IMPLANT
GLOVE BIOGEL PI INDICATOR 7.5 (GLOVE) ×4
GLOVE BIOGEL PI INDICATOR 8 (GLOVE) ×2
GLOVE ECLIPSE 7.0 STRL STRAW (GLOVE) ×2 IMPLANT
GLOVE ECLIPSE 8.0 STRL XLNG CF (GLOVE) ×2 IMPLANT
GLOVE SURG SS PI 7.5 STRL IVOR (GLOVE) ×3 IMPLANT
GOWN STRL REUS W/ TWL LRG LVL3 (GOWN DISPOSABLE) ×2 IMPLANT
GOWN STRL REUS W/ TWL XL LVL3 (GOWN DISPOSABLE) ×1 IMPLANT
GOWN STRL REUS W/TWL LRG LVL3 (GOWN DISPOSABLE) ×2
GOWN STRL REUS W/TWL XL LVL3 (GOWN DISPOSABLE) ×4
HEMOSTAT SNOW SURGICEL 2X4 (HEMOSTASIS) IMPLANT
KIT BASIN OR (CUSTOM PROCEDURE TRAY) ×3 IMPLANT
KIT TURNOVER KIT B (KITS) ×3 IMPLANT
NS IRRIG 1000ML POUR BTL (IV SOLUTION) ×3 IMPLANT
PACK CV ACCESS (CUSTOM PROCEDURE TRAY) ×3 IMPLANT
PAD ARMBOARD 7.5X6 YLW CONV (MISCELLANEOUS) ×6 IMPLANT
SUT PROLENE 6 0 CC (SUTURE) ×5 IMPLANT
SUT SILK 2 0 SH (SUTURE) IMPLANT
SUT VIC AB 3-0 SH 27 (SUTURE) ×2
SUT VIC AB 3-0 SH 27X BRD (SUTURE) ×1 IMPLANT
SUT VICRYL 4-0 PS2 18IN ABS (SUTURE) ×3 IMPLANT
TOWEL GREEN STERILE (TOWEL DISPOSABLE) ×3 IMPLANT
UNDERPAD 30X30 (UNDERPADS AND DIAPERS) ×3 IMPLANT
WATER STERILE IRR 1000ML POUR (IV SOLUTION) ×3 IMPLANT

## 2018-12-24 NOTE — Anesthesia Preprocedure Evaluation (Addendum)
Anesthesia Evaluation  Patient identified by MRN, date of birth, ID band Patient awake    Reviewed: Allergy & Precautions, NPO status , Patient's Chart, lab work & pertinent test results, reviewed documented beta blocker date and time   Airway Mallampati: II  TM Distance: >3 FB Neck ROM: Full    Dental  (+) Dental Advisory Given, Missing   Pulmonary asthma , former smoker,    Pulmonary exam normal breath sounds clear to auscultation       Cardiovascular hypertension, Pt. on medications and Pt. on home beta blockers +CHF  Normal cardiovascular exam Rhythm:Regular Rate:Normal     Neuro/Psych  Neuromuscular disease negative psych ROS   GI/Hepatic negative GI ROS, Neg liver ROS,   Endo/Other  diabetes, Type 2, Insulin DependentMorbid obesity  Renal/GU ESRF and DialysisRenal disease (MWF; K+ 3.8)     Musculoskeletal  (+) Arthritis ,   Abdominal   Peds  Hematology negative hematology ROS (+)   Anesthesia Other Findings Day of surgery medications reviewed with the patient.  Reproductive/Obstetrics                            Anesthesia Physical Anesthesia Plan  ASA: III  Anesthesia Plan: MAC   Post-op Pain Management:    Induction: Intravenous  PONV Risk Score and Plan: 2 and Propofol infusion and Treatment may vary due to age or medical condition  Airway Management Planned: Nasal Cannula and Natural Airway  Additional Equipment:   Intra-op Plan:   Post-operative Plan:   Informed Consent: I have reviewed the patients History and Physical, chart, labs and discussed the procedure including the risks, benefits and alternatives for the proposed anesthesia with the patient or authorized representative who has indicated his/her understanding and acceptance.     Dental advisory given  Plan Discussed with: CRNA and Anesthesiologist  Anesthesia Plan Comments:         Anesthesia  Quick Evaluation

## 2018-12-24 NOTE — Op Note (Signed)
    Patient name: Christy Zhang MRN: [id number] DOB: Aug 23, 1955 Sex: female  12/24/2018 Pre-operative Diagnosis: End-stage renal disease Post-operative diagnosis:  Same Surgeon:  Annamarie Major Assistants: Laurence Slate Procedure:   First stage right basilic vein fistula Anesthesia: MAC Blood Loss: Minimal Specimens: None  Findings: Heavily calcified brachial artery.  The vein and artery were very deep.  If this does not mature, she would need a graft either in the forearm or a upper arm loop graft  Indications: The patient has an occluded right brachiocephalic fistula.  She comes in today for for stage right basilic vein fistula creation  Procedure:  The patient was identified in the holding area and taken to [place] [facility]  The patient was then placed supine on the table. MAC anesthesia was administered.  The patient was prepped and draped in the usual sterile fashion.  A time out was called and antibiotics were administered.  1% lidocaine was used for local anesthesia.  An oblique incision was made above the antecubital crease.  Through this incision I dissected out the brachial artery.  This was very deep under the subcutaneous tissue.  Once isolate the brachial artery it was encircled with Vesseloops.  It was heavily calcified.  Through the same incision I exposed the basilic vein.  This was also very deep under the skin.  It was fully mobilized and I ligated all branches with silk ties.  The vein was then ligated distally with 2-0 silk tie.  It distended nicely with heparin saline.  The brachial artery was then occluded with vascular clamps.  A #11 blade was used to make an arteriotomy which was extended longitudinally with Potts scissors.  The artery was heavily calcified.  The vein was then cut the appropriate length and spatulated to fit the size of the arteriotomy.  A running anastomosis was created with 6-0 Prolene.  Prior to completion the appropriate flushing maneuvers were  performed the anastomosis was completed.  Patient had a brisk radial artery Doppler signal and a audible thrill with Doppler in the vein.  I inspected the course of the vein to make sure there were no kinks.  Hemostasis was then achieved.  The incision was closed with 2 layers of Vicryl followed by Dermabond.  There were no immediate complications.   Disposition: To PACU stable.   Theotis Burrow, M.D., [facility] [place] Office: [phone] Pager:  [phone]

## 2018-12-24 NOTE — H&P (Signed)
   Patient name: Christy Zhang MRN: [id number] DOB: [birth year] Sex: female   HISTORY OF PRESENT ILLNESS:   Christy Zhang is a 63 y.o. female with ESRD in need of new access  CURRENT MEDICATIONS:    Current Facility-Administered Medications  Medication Dose Route Frequency Provider Last Rate Last Dose  . 0.9 %  sodium chloride infusion   Intravenous Continuous Serafina [place], MD      . ceFAZolin (ANCEF) 3 g in dextrose 5 % 50 mL IVPB  3 g Intravenous To SS-Surg Serafina [place], MD      . chlorhexidine (HIBICLENS) 4 % liquid 4 application  60 mL Topical Once Serafina [place], MD       And  . [name] ON 12/25/2018] chlorhexidine (HIBICLENS) 4 % liquid 4 application  60 mL Topical Once Serafina Mitchell, MD        REVIEW OF SYSTEMS:   [X]  denotes positive finding, [ ]  denotes negative finding Cardiac  Comments:  Chest pain or chest pressure:    Shortness of breath upon exertion:    Short of breath when lying flat:    Irregular heart rhythm:    Constitutional    Fever or chills:      PHYSICAL EXAM:   There were no vitals filed for this visit.  GENERAL: The patient is a well-nourished female, in no acute distress. The vital signs are documented above. CARDIOVASCULAR: There is a regular rate and rhythm. PULMONARY: Non-labored respirations   STUDIES:   Vein mapping shows adequate right basilic vein   MEDICAL ISSUES:   Plan for right first stage BVT.  All questions answered  Leia Alf, MD, FACS [facility] [phone] Pager [phone]

## 2018-12-24 NOTE — Anesthesia Postprocedure Evaluation (Signed)
Anesthesia Post Note  Patient: Christy Zhang [facility]  Procedure(s) Performed: FIRST STAGE BASILIC VEIN TRANSPOSITION RIGHT UPPER ARM (Right Arm Upper)     Patient location during evaluation: PACU Anesthesia Type: MAC Level of consciousness: awake and alert, awake and oriented Pain management: pain level controlled Vital Signs Assessment: post-procedure vital signs reviewed and stable Respiratory status: spontaneous breathing, nonlabored ventilation and respiratory function stable Cardiovascular status: stable and blood pressure returned to baseline Postop Assessment: no apparent nausea or vomiting Anesthetic complications: no    Last Vitals:  Vitals:   12/24/18 0812 12/24/18 1215  BP: (!) 187/66 (!) 131/47  Pulse: 80   Resp: 18   Temp: 36.8 C 36.4 C  SpO2: 95%     Last Pain:  Vitals:   12/24/18 0812  TempSrc: Oral  PainSc:                  Catalina Gravel

## 2018-12-24 NOTE — Anesthesia Procedure Notes (Signed)
Procedure Name: MAC Date/Time: 12/24/2018 10:30 AM Performed by: Moshe Salisbury, CRNA Pre-anesthesia Checklist: Patient identified, Emergency Drugs available, Patient being monitored, Suction available and Timeout performed Patient Re-evaluated:Patient Re-evaluated prior to induction Oxygen Delivery Method: Nasal cannula Placement Confirmation: positive ETCO2 Dental Injury: Teeth and Oropharynx as per pre-operative assessment

## 2018-12-24 NOTE — Transfer of Care (Signed)
Immediate Anesthesia Transfer of Care Note  Patient: Christy Zhang [facility]  Procedure(s) Performed: FIRST STAGE BASILIC VEIN TRANSPOSITION RIGHT UPPER ARM (Right Arm Upper)  Patient Location: PACU  Anesthesia Type:MAC  Level of Consciousness: drowsy and patient cooperative  Airway & Oxygen Therapy: Patient Spontanous Breathing and Patient connected to nasal cannula oxygen  Post-op Assessment: Report given to RN, Post -op Vital signs reviewed and stable and Patient moving all extremities  Post vital signs: Reviewed and stable  Last Vitals:  Vitals Value Taken Time  BP 131/47 12/24/18 1211  Temp    Pulse 82 12/24/18 1212  Resp 16 12/24/18 1212  SpO2 99 % 12/24/18 1212  Vitals shown include unvalidated device data.  Last Pain:  Vitals:   12/24/18 0812  TempSrc: Oral  PainSc:          Complications: No apparent anesthesia complications

## 2018-12-25 ENCOUNTER — Encounter (HOSPITAL_COMMUNITY): Payer: Self-pay | Admitting: Surgery

## 2018-12-25 DIAGNOSIS — D509 Iron deficiency anemia, unspecified: Secondary | ICD-10-CM | POA: Diagnosis not present

## 2018-12-25 DIAGNOSIS — N2581 Secondary hyperparathyroidism of renal origin: Secondary | ICD-10-CM | POA: Diagnosis not present

## 2018-12-25 DIAGNOSIS — N186 End stage renal disease: Secondary | ICD-10-CM | POA: Diagnosis not present

## 2018-12-25 DIAGNOSIS — D631 Anemia in chronic kidney disease: Secondary | ICD-10-CM | POA: Diagnosis not present

## 2018-12-25 DIAGNOSIS — Z992 Dependence on renal dialysis: Secondary | ICD-10-CM | POA: Diagnosis not present

## 2018-12-28 DIAGNOSIS — D509 Iron deficiency anemia, unspecified: Secondary | ICD-10-CM | POA: Diagnosis not present

## 2018-12-28 DIAGNOSIS — Z992 Dependence on renal dialysis: Secondary | ICD-10-CM | POA: Diagnosis not present

## 2018-12-28 DIAGNOSIS — N186 End stage renal disease: Secondary | ICD-10-CM | POA: Diagnosis not present

## 2018-12-28 DIAGNOSIS — N2581 Secondary hyperparathyroidism of renal origin: Secondary | ICD-10-CM | POA: Diagnosis not present

## 2018-12-28 DIAGNOSIS — D631 Anemia in chronic kidney disease: Secondary | ICD-10-CM | POA: Diagnosis not present

## 2018-12-30 DIAGNOSIS — N186 End stage renal disease: Secondary | ICD-10-CM | POA: Diagnosis not present

## 2018-12-30 DIAGNOSIS — D631 Anemia in chronic kidney disease: Secondary | ICD-10-CM | POA: Diagnosis not present

## 2018-12-30 DIAGNOSIS — D509 Iron deficiency anemia, unspecified: Secondary | ICD-10-CM | POA: Diagnosis not present

## 2018-12-30 DIAGNOSIS — N2581 Secondary hyperparathyroidism of renal origin: Secondary | ICD-10-CM | POA: Diagnosis not present

## 2018-12-30 DIAGNOSIS — Z992 Dependence on renal dialysis: Secondary | ICD-10-CM | POA: Diagnosis not present

## 2019-01-01 DIAGNOSIS — N186 End stage renal disease: Secondary | ICD-10-CM | POA: Diagnosis not present

## 2019-01-01 DIAGNOSIS — D631 Anemia in chronic kidney disease: Secondary | ICD-10-CM | POA: Diagnosis not present

## 2019-01-01 DIAGNOSIS — D509 Iron deficiency anemia, unspecified: Secondary | ICD-10-CM | POA: Diagnosis not present

## 2019-01-01 DIAGNOSIS — Z992 Dependence on renal dialysis: Secondary | ICD-10-CM | POA: Diagnosis not present

## 2019-01-01 DIAGNOSIS — N2581 Secondary hyperparathyroidism of renal origin: Secondary | ICD-10-CM | POA: Diagnosis not present

## 2019-01-04 DIAGNOSIS — D509 Iron deficiency anemia, unspecified: Secondary | ICD-10-CM | POA: Diagnosis not present

## 2019-01-04 DIAGNOSIS — Z992 Dependence on renal dialysis: Secondary | ICD-10-CM | POA: Diagnosis not present

## 2019-01-04 DIAGNOSIS — N186 End stage renal disease: Secondary | ICD-10-CM | POA: Diagnosis not present

## 2019-01-04 DIAGNOSIS — N2581 Secondary hyperparathyroidism of renal origin: Secondary | ICD-10-CM | POA: Diagnosis not present

## 2019-01-04 DIAGNOSIS — D631 Anemia in chronic kidney disease: Secondary | ICD-10-CM | POA: Diagnosis not present

## 2019-01-06 DIAGNOSIS — N186 End stage renal disease: Secondary | ICD-10-CM | POA: Diagnosis not present

## 2019-01-06 DIAGNOSIS — D509 Iron deficiency anemia, unspecified: Secondary | ICD-10-CM | POA: Diagnosis not present

## 2019-01-06 DIAGNOSIS — D631 Anemia in chronic kidney disease: Secondary | ICD-10-CM | POA: Diagnosis not present

## 2019-01-06 DIAGNOSIS — Z992 Dependence on renal dialysis: Secondary | ICD-10-CM | POA: Diagnosis not present

## 2019-01-06 DIAGNOSIS — N2581 Secondary hyperparathyroidism of renal origin: Secondary | ICD-10-CM | POA: Diagnosis not present

## 2019-01-08 DIAGNOSIS — N186 End stage renal disease: Secondary | ICD-10-CM | POA: Diagnosis not present

## 2019-01-08 DIAGNOSIS — D509 Iron deficiency anemia, unspecified: Secondary | ICD-10-CM | POA: Diagnosis not present

## 2019-01-08 DIAGNOSIS — D631 Anemia in chronic kidney disease: Secondary | ICD-10-CM | POA: Diagnosis not present

## 2019-01-08 DIAGNOSIS — Z992 Dependence on renal dialysis: Secondary | ICD-10-CM | POA: Diagnosis not present

## 2019-01-08 DIAGNOSIS — N2581 Secondary hyperparathyroidism of renal origin: Secondary | ICD-10-CM | POA: Diagnosis not present

## 2019-01-11 DIAGNOSIS — N2581 Secondary hyperparathyroidism of renal origin: Secondary | ICD-10-CM | POA: Diagnosis not present

## 2019-01-11 DIAGNOSIS — D509 Iron deficiency anemia, unspecified: Secondary | ICD-10-CM | POA: Diagnosis not present

## 2019-01-11 DIAGNOSIS — D631 Anemia in chronic kidney disease: Secondary | ICD-10-CM | POA: Diagnosis not present

## 2019-01-11 DIAGNOSIS — N186 End stage renal disease: Secondary | ICD-10-CM | POA: Diagnosis not present

## 2019-01-11 DIAGNOSIS — Z992 Dependence on renal dialysis: Secondary | ICD-10-CM | POA: Diagnosis not present

## 2019-01-13 DIAGNOSIS — N186 End stage renal disease: Secondary | ICD-10-CM | POA: Diagnosis not present

## 2019-01-13 DIAGNOSIS — N2581 Secondary hyperparathyroidism of renal origin: Secondary | ICD-10-CM | POA: Diagnosis not present

## 2019-01-13 DIAGNOSIS — D509 Iron deficiency anemia, unspecified: Secondary | ICD-10-CM | POA: Diagnosis not present

## 2019-01-13 DIAGNOSIS — Z992 Dependence on renal dialysis: Secondary | ICD-10-CM | POA: Diagnosis not present

## 2019-01-13 DIAGNOSIS — D631 Anemia in chronic kidney disease: Secondary | ICD-10-CM | POA: Diagnosis not present

## 2019-01-15 DIAGNOSIS — N186 End stage renal disease: Secondary | ICD-10-CM | POA: Diagnosis not present

## 2019-01-15 DIAGNOSIS — Z992 Dependence on renal dialysis: Secondary | ICD-10-CM | POA: Diagnosis not present

## 2019-01-15 DIAGNOSIS — N2581 Secondary hyperparathyroidism of renal origin: Secondary | ICD-10-CM | POA: Diagnosis not present

## 2019-01-15 DIAGNOSIS — D631 Anemia in chronic kidney disease: Secondary | ICD-10-CM | POA: Diagnosis not present

## 2019-01-15 DIAGNOSIS — D509 Iron deficiency anemia, unspecified: Secondary | ICD-10-CM | POA: Diagnosis not present

## 2019-01-18 DIAGNOSIS — D631 Anemia in chronic kidney disease: Secondary | ICD-10-CM | POA: Diagnosis not present

## 2019-01-18 DIAGNOSIS — N186 End stage renal disease: Secondary | ICD-10-CM | POA: Diagnosis not present

## 2019-01-18 DIAGNOSIS — N2581 Secondary hyperparathyroidism of renal origin: Secondary | ICD-10-CM | POA: Diagnosis not present

## 2019-01-18 DIAGNOSIS — D509 Iron deficiency anemia, unspecified: Secondary | ICD-10-CM | POA: Diagnosis not present

## 2019-01-18 DIAGNOSIS — Z992 Dependence on renal dialysis: Secondary | ICD-10-CM | POA: Diagnosis not present

## 2019-01-19 ENCOUNTER — Other Ambulatory Visit: Payer: Self-pay | Admitting: Family Medicine

## 2019-01-20 DIAGNOSIS — Z992 Dependence on renal dialysis: Secondary | ICD-10-CM | POA: Diagnosis not present

## 2019-01-20 DIAGNOSIS — N2581 Secondary hyperparathyroidism of renal origin: Secondary | ICD-10-CM | POA: Diagnosis not present

## 2019-01-20 DIAGNOSIS — D631 Anemia in chronic kidney disease: Secondary | ICD-10-CM | POA: Diagnosis not present

## 2019-01-20 DIAGNOSIS — N186 End stage renal disease: Secondary | ICD-10-CM | POA: Diagnosis not present

## 2019-01-20 DIAGNOSIS — D509 Iron deficiency anemia, unspecified: Secondary | ICD-10-CM | POA: Diagnosis not present

## 2019-01-22 DIAGNOSIS — D509 Iron deficiency anemia, unspecified: Secondary | ICD-10-CM | POA: Diagnosis not present

## 2019-01-22 DIAGNOSIS — Z794 Long term (current) use of insulin: Secondary | ICD-10-CM | POA: Diagnosis not present

## 2019-01-22 DIAGNOSIS — E119 Type 2 diabetes mellitus without complications: Secondary | ICD-10-CM | POA: Diagnosis not present

## 2019-01-22 DIAGNOSIS — Z992 Dependence on renal dialysis: Secondary | ICD-10-CM | POA: Diagnosis not present

## 2019-01-22 DIAGNOSIS — N186 End stage renal disease: Secondary | ICD-10-CM | POA: Diagnosis not present

## 2019-01-22 DIAGNOSIS — D631 Anemia in chronic kidney disease: Secondary | ICD-10-CM | POA: Diagnosis not present

## 2019-01-22 DIAGNOSIS — N2581 Secondary hyperparathyroidism of renal origin: Secondary | ICD-10-CM | POA: Diagnosis not present

## 2019-01-25 DIAGNOSIS — N2581 Secondary hyperparathyroidism of renal origin: Secondary | ICD-10-CM | POA: Diagnosis not present

## 2019-01-25 DIAGNOSIS — D509 Iron deficiency anemia, unspecified: Secondary | ICD-10-CM | POA: Diagnosis not present

## 2019-01-25 DIAGNOSIS — Z992 Dependence on renal dialysis: Secondary | ICD-10-CM | POA: Diagnosis not present

## 2019-01-25 DIAGNOSIS — N186 End stage renal disease: Secondary | ICD-10-CM | POA: Diagnosis not present

## 2019-01-25 DIAGNOSIS — D631 Anemia in chronic kidney disease: Secondary | ICD-10-CM | POA: Diagnosis not present

## 2019-01-27 DIAGNOSIS — N186 End stage renal disease: Secondary | ICD-10-CM | POA: Diagnosis not present

## 2019-01-27 DIAGNOSIS — D631 Anemia in chronic kidney disease: Secondary | ICD-10-CM | POA: Diagnosis not present

## 2019-01-27 DIAGNOSIS — D509 Iron deficiency anemia, unspecified: Secondary | ICD-10-CM | POA: Diagnosis not present

## 2019-01-27 DIAGNOSIS — N2581 Secondary hyperparathyroidism of renal origin: Secondary | ICD-10-CM | POA: Diagnosis not present

## 2019-01-27 DIAGNOSIS — Z992 Dependence on renal dialysis: Secondary | ICD-10-CM | POA: Diagnosis not present

## 2019-01-28 ENCOUNTER — Ambulatory Visit (INDEPENDENT_AMBULATORY_CARE_PROVIDER_SITE_OTHER): Payer: Self-pay | Admitting: Physician Assistant

## 2019-01-28 ENCOUNTER — Other Ambulatory Visit: Payer: Self-pay

## 2019-01-28 VITALS — BP 132/72 | HR 68 | Temp 97.7°F | Resp 16 | Ht 64.0 in | Wt 255.3 lb

## 2019-01-28 DIAGNOSIS — N186 End stage renal disease: Secondary | ICD-10-CM

## 2019-01-28 DIAGNOSIS — Z992 Dependence on renal dialysis: Secondary | ICD-10-CM

## 2019-01-28 NOTE — Progress Notes (Signed)
Established Dialysis Access   History of Present Illness   Christy Zhang is a 63 y.o. (24-Jul-1955) female who presents for re-evaluation for permanent access.  Prior dialysis access included right brachiocephalic fistula which worked for about a year.  Most recently she underwent first stage basilic vein transposition by Dr. Trula Slade on 12/24/2018.  Today at her postoperative visit this fistula has already thrombosed.  She continues to dialyze via right IJ tunneled dialysis catheter on a Monday Wednesday Friday schedule.  She does not take any blood thinners.  The patient's PMH, PSH, SH, and FamHx were reviewed and are unchanged from prior visit.  Current Outpatient Medications  Medication Sig Dispense Refill  . acetaminophen (TYLENOL) 325 MG tablet Take 650 mg by mouth every 6 (six) hours as needed for moderate pain or headache.    Marland Kitchen amLODipine (NORVASC) 5 MG tablet Take 5 mg by mouth daily.    Marland Kitchen atorvastatin (LIPITOR) 40 MG tablet Take 1 tablet (40 mg total) by mouth daily. 90 tablet 3  . Blood Glucose Monitoring Suppl (BLOOD GLUCOSE MONITOR SYSTEM) w/Device KIT Use to check Blood Sugar up to 4 x daily as directed. Dispense based on pt insurance. E11.9 1 each 0  . diphenhydrAMINE (BENADRYL) 25 mg capsule Take 25 mg by mouth every 8 (eight) hours as needed for allergies or sleep.     . Dulaglutide (TRULICITY) 1.5 SW/9.6PR SOPN Inject content of one pen under the skin weekly on Saturday (Patient taking differently: Inject 1.5 mg as directed every Saturday. ) 4 pen 12  . fluocinonide-emollient (LIDEX-E) 0.05 % cream Apply 1 application topically 2 (two) times daily. To affected areas (Patient taking differently: Apply 1 application topically 2 (two) times daily as needed (skin spots). To affected areas) 60 g 5  . glucose blood test strip Use to test blood sugars QID 100 each 12  . Insulin Glargine, 1 Unit Dial, (TOUJEO SOLOSTAR) 300 UNIT/ML SOPN Inject 90 Units into the skin  daily. 9 mL 1  . Lancets Misc. KIT 1 each by Does not apply route 4 (four) times daily as needed. 120 each 11  . metoprolol succinate (TOPROL-XL) 100 MG 24 hr tablet Take 1 tablet (100 mg total) by mouth daily. Take with or immediately following a meal. 90 tablet 1  . multivitamin (RENA-VIT) TABS tablet Take 1 tablet by mouth daily.    Marland Kitchen oxyCODONE-acetaminophen (PERCOCET/ROXICET) 5-325 MG tablet Take 1 tablet by mouth every 6 (six) hours as needed. 6 tablet 0  . sevelamer carbonate (RENVELA) 800 MG tablet Take 1,600-2,400 mg by mouth See admin instructions. Take 2400 mg with each meal and 1600 mg with each snack     No current facility-administered medications for this visit.     On ROS today: 10 system ROS is negative unless otherwise noted in HPI   Physical Examination   Vitals:   01/28/19 1432  BP: 132/72  Pulse: 68  Resp: 16  Temp: 97.7 F (36.5 C)  TempSrc: Temporal  SpO2: 98%  Weight: 255 lb 4.8 oz (115.8 kg)  Height: 5' 4"  (1.626 m)   Body mass index is 43.82 kg/m.  General Alert, O x 3, WD, NAD  Pulmonary Sym exp, good B air movt, CTA B  Cardiac RRR, Nl S1, S2  Vascular Vessel Right Left  Radial Palpable Palpable  Brachial Palpable Palpable  Ulnar Not palpable Not palpable    Musculo- skeletal  no discernible flow through basilic vein fistula with Doppler M/S  5/5 throughout  , Extremities without ischemic changes    Neurologic A&O; CN grossly intact     Medical Decision Making   Christy Zhang is a 63 y.o. female who presents with ESRD requiring hemodialysis.    New brachiobasilic fistula has thrombosed  Plan will be for right upper arm loop or forearm loop graft placement  Due to car trouble and lack of reliable transportation, patient would prefer to call back to schedule AV graft placement with Dr. Trula Slade on a non dialysis day Risk, benefits, and alternatives to access surgery were discussed.   The patient is aware the risks include but  are not limited to: bleeding, infection, steal syndrome, nerve damage, thrombosis, failure to mature, and need for additional procedures.   The patient agrees to proceed with the procedure.   Dagoberto Ligas PA-C [facility] [place] Office: [phone]  Clinic MD: Dr. Oneida Alar

## 2019-01-29 DIAGNOSIS — N2581 Secondary hyperparathyroidism of renal origin: Secondary | ICD-10-CM | POA: Diagnosis not present

## 2019-01-29 DIAGNOSIS — D509 Iron deficiency anemia, unspecified: Secondary | ICD-10-CM | POA: Diagnosis not present

## 2019-01-29 DIAGNOSIS — N186 End stage renal disease: Secondary | ICD-10-CM | POA: Diagnosis not present

## 2019-01-29 DIAGNOSIS — Z23 Encounter for immunization: Secondary | ICD-10-CM | POA: Diagnosis not present

## 2019-01-29 DIAGNOSIS — D631 Anemia in chronic kidney disease: Secondary | ICD-10-CM | POA: Diagnosis not present

## 2019-01-29 DIAGNOSIS — Z992 Dependence on renal dialysis: Secondary | ICD-10-CM | POA: Diagnosis not present

## 2019-02-01 DIAGNOSIS — Z992 Dependence on renal dialysis: Secondary | ICD-10-CM | POA: Diagnosis not present

## 2019-02-01 DIAGNOSIS — N186 End stage renal disease: Secondary | ICD-10-CM | POA: Diagnosis not present

## 2019-02-01 DIAGNOSIS — D509 Iron deficiency anemia, unspecified: Secondary | ICD-10-CM | POA: Diagnosis not present

## 2019-02-01 DIAGNOSIS — N2581 Secondary hyperparathyroidism of renal origin: Secondary | ICD-10-CM | POA: Diagnosis not present

## 2019-02-01 DIAGNOSIS — Z23 Encounter for immunization: Secondary | ICD-10-CM | POA: Diagnosis not present

## 2019-02-01 DIAGNOSIS — D631 Anemia in chronic kidney disease: Secondary | ICD-10-CM | POA: Diagnosis not present

## 2019-02-03 DIAGNOSIS — D631 Anemia in chronic kidney disease: Secondary | ICD-10-CM | POA: Diagnosis not present

## 2019-02-03 DIAGNOSIS — Z23 Encounter for immunization: Secondary | ICD-10-CM | POA: Diagnosis not present

## 2019-02-03 DIAGNOSIS — Z992 Dependence on renal dialysis: Secondary | ICD-10-CM | POA: Diagnosis not present

## 2019-02-03 DIAGNOSIS — D509 Iron deficiency anemia, unspecified: Secondary | ICD-10-CM | POA: Diagnosis not present

## 2019-02-03 DIAGNOSIS — N2581 Secondary hyperparathyroidism of renal origin: Secondary | ICD-10-CM | POA: Diagnosis not present

## 2019-02-03 DIAGNOSIS — N186 End stage renal disease: Secondary | ICD-10-CM | POA: Diagnosis not present

## 2019-02-05 DIAGNOSIS — Z992 Dependence on renal dialysis: Secondary | ICD-10-CM | POA: Diagnosis not present

## 2019-02-05 DIAGNOSIS — N186 End stage renal disease: Secondary | ICD-10-CM | POA: Diagnosis not present

## 2019-02-05 DIAGNOSIS — Z23 Encounter for immunization: Secondary | ICD-10-CM | POA: Diagnosis not present

## 2019-02-05 DIAGNOSIS — D509 Iron deficiency anemia, unspecified: Secondary | ICD-10-CM | POA: Diagnosis not present

## 2019-02-05 DIAGNOSIS — D631 Anemia in chronic kidney disease: Secondary | ICD-10-CM | POA: Diagnosis not present

## 2019-02-05 DIAGNOSIS — N2581 Secondary hyperparathyroidism of renal origin: Secondary | ICD-10-CM | POA: Diagnosis not present

## 2019-02-08 DIAGNOSIS — N2581 Secondary hyperparathyroidism of renal origin: Secondary | ICD-10-CM | POA: Diagnosis not present

## 2019-02-08 DIAGNOSIS — D631 Anemia in chronic kidney disease: Secondary | ICD-10-CM | POA: Diagnosis not present

## 2019-02-08 DIAGNOSIS — Z23 Encounter for immunization: Secondary | ICD-10-CM | POA: Diagnosis not present

## 2019-02-08 DIAGNOSIS — N186 End stage renal disease: Secondary | ICD-10-CM | POA: Diagnosis not present

## 2019-02-08 DIAGNOSIS — D509 Iron deficiency anemia, unspecified: Secondary | ICD-10-CM | POA: Diagnosis not present

## 2019-02-08 DIAGNOSIS — Z992 Dependence on renal dialysis: Secondary | ICD-10-CM | POA: Diagnosis not present

## 2019-02-10 DIAGNOSIS — Z992 Dependence on renal dialysis: Secondary | ICD-10-CM | POA: Diagnosis not present

## 2019-02-10 DIAGNOSIS — Z23 Encounter for immunization: Secondary | ICD-10-CM | POA: Diagnosis not present

## 2019-02-10 DIAGNOSIS — D509 Iron deficiency anemia, unspecified: Secondary | ICD-10-CM | POA: Diagnosis not present

## 2019-02-10 DIAGNOSIS — D631 Anemia in chronic kidney disease: Secondary | ICD-10-CM | POA: Diagnosis not present

## 2019-02-10 DIAGNOSIS — N2581 Secondary hyperparathyroidism of renal origin: Secondary | ICD-10-CM | POA: Diagnosis not present

## 2019-02-10 DIAGNOSIS — N186 End stage renal disease: Secondary | ICD-10-CM | POA: Diagnosis not present

## 2019-02-12 DIAGNOSIS — Z23 Encounter for immunization: Secondary | ICD-10-CM | POA: Diagnosis not present

## 2019-02-12 DIAGNOSIS — D509 Iron deficiency anemia, unspecified: Secondary | ICD-10-CM | POA: Diagnosis not present

## 2019-02-12 DIAGNOSIS — Z992 Dependence on renal dialysis: Secondary | ICD-10-CM | POA: Diagnosis not present

## 2019-02-12 DIAGNOSIS — N2581 Secondary hyperparathyroidism of renal origin: Secondary | ICD-10-CM | POA: Diagnosis not present

## 2019-02-12 DIAGNOSIS — D631 Anemia in chronic kidney disease: Secondary | ICD-10-CM | POA: Diagnosis not present

## 2019-02-12 DIAGNOSIS — N186 End stage renal disease: Secondary | ICD-10-CM | POA: Diagnosis not present

## 2019-02-15 ENCOUNTER — Other Ambulatory Visit: Payer: Self-pay

## 2019-02-15 DIAGNOSIS — N186 End stage renal disease: Secondary | ICD-10-CM | POA: Diagnosis not present

## 2019-02-15 DIAGNOSIS — N2581 Secondary hyperparathyroidism of renal origin: Secondary | ICD-10-CM | POA: Diagnosis not present

## 2019-02-15 DIAGNOSIS — D631 Anemia in chronic kidney disease: Secondary | ICD-10-CM | POA: Diagnosis not present

## 2019-02-15 DIAGNOSIS — D509 Iron deficiency anemia, unspecified: Secondary | ICD-10-CM | POA: Diagnosis not present

## 2019-02-15 DIAGNOSIS — Z992 Dependence on renal dialysis: Secondary | ICD-10-CM | POA: Diagnosis not present

## 2019-02-15 DIAGNOSIS — Z23 Encounter for immunization: Secondary | ICD-10-CM | POA: Diagnosis not present

## 2019-02-16 ENCOUNTER — Ambulatory Visit (INDEPENDENT_AMBULATORY_CARE_PROVIDER_SITE_OTHER): Payer: Medicare Other | Admitting: Family Medicine

## 2019-02-16 ENCOUNTER — Encounter: Payer: Self-pay | Admitting: Family Medicine

## 2019-02-16 VITALS — BP 184/92 | HR 74 | Temp 98.6°F | Resp 20 | Ht 64.0 in | Wt 254.0 lb

## 2019-02-16 DIAGNOSIS — E1122 Type 2 diabetes mellitus with diabetic chronic kidney disease: Secondary | ICD-10-CM

## 2019-02-16 DIAGNOSIS — E1169 Type 2 diabetes mellitus with other specified complication: Secondary | ICD-10-CM | POA: Diagnosis not present

## 2019-02-16 DIAGNOSIS — Z794 Long term (current) use of insulin: Secondary | ICD-10-CM | POA: Diagnosis not present

## 2019-02-16 DIAGNOSIS — N2889 Other specified disorders of kidney and ureter: Secondary | ICD-10-CM | POA: Diagnosis not present

## 2019-02-16 DIAGNOSIS — E785 Hyperlipidemia, unspecified: Secondary | ICD-10-CM | POA: Diagnosis not present

## 2019-02-16 DIAGNOSIS — Z992 Dependence on renal dialysis: Secondary | ICD-10-CM

## 2019-02-16 DIAGNOSIS — I151 Hypertension secondary to other renal disorders: Secondary | ICD-10-CM

## 2019-02-16 DIAGNOSIS — N186 End stage renal disease: Secondary | ICD-10-CM

## 2019-02-16 LAB — BAYER DCA HB A1C WAIVED: HB A1C (BAYER DCA - WAIVED): 8.4 % — ABNORMAL HIGH (ref <7.0)

## 2019-02-16 MED ORDER — TOUJEO SOLOSTAR 300 UNIT/ML ~~LOC~~ SOPN: 100.0000 [IU] | PEN_INJECTOR | Freq: Every day | SUBCUTANEOUS | 2 refills | Status: DC

## 2019-02-16 MED ORDER — BLOOD GLUCOSE MONITOR KIT: PACK | 0 refills | Status: DC

## 2019-02-16 NOTE — Progress Notes (Signed)
Subjective:  Patient ID: Christy Zhang, female    DOB: 1956/01/17  Age: 63 y.o. MRN: [id number]  CC: Medical Management of Chronic Issues (4 mo ), Hyperlipidemia, Diabetes, and Hypertension   HPI [facility] presents forFollow-up of diabetes. Patient checks blood sugar at home. She doesn't have a monitor so she is using her daghter's.  under 140 fasting and under 220 postprandial Patient denies symptoms such as polyuria, polydipsia, excessive hunger, nausea No significant hypoglycemic spells noted. Medications reviewed. Pt reports taking them regularly without complication/adverse reaction being reported today.  Checking feet daily. Last eye appt was remote Getting dialysis with subclavian line dueto failure of graft. Weght down 9 lbs she says.   History [place] has a past medical history of Anemia, Arthritis, Asthma, CHF (congestive heart failure) ([place]), Chronic kidney disease, Diabetes mellitus with complication ([place]), End stage renal disease ([place]), End stage renal disease on dialysis [facility]), Hypertension, and Neuromuscular disorder ([place]).   She has a past surgical history that includes Insertion of dialysis catheter; Tubal ligation; AV fistula placement (Right, 08/25/2014); Fistula superficialization (Right, 11/03/2014); Ligation of competing branches of arteriovenous fistula (Right, 11/03/2014); Fistulogram (Right, 01/16/2016); A/V Fistulagram (Right, 02/04/2017); PERIPHERAL VASCULAR BALLOON ANGIOPLASTY (Right, 02/04/2017); IR THROMBECTOMY AV FISTULA/W THROMBOLYSIS/PTA INC SHUNT/IMG RIGHT (Right, 12/03/2018); IR US Guide Vasc Access Right (12/03/2018); IR Fluoro Guide CV Line Right (12/03/2018); IR US Guide Vasc Access Right (05/02/1028); and Bascilic vein transposition (Right, 12/24/2018).   Her family history includes Cancer in her father; Diabetes in her daughter, father, mother, and son; Heart disease in her daughter and son; Hypertension in her daughter, mother, sister, and  son; Peripheral vascular disease in her son.She reports that she quit smoking about 34 years ago. Her smoking use included cigarettes. She has never used smokeless tobacco. She reports that she does not drink alcohol or use drugs.  Current Outpatient Medications on File Prior to Visit  Medication Sig Dispense Refill  . acetaminophen (TYLENOL) 325 MG tablet Take 650 mg by mouth every 6 (six) hours as needed for moderate pain or headache.    Marland Kitchen amLODipine (NORVASC) 5 MG tablet Take 5 mg by mouth daily.    Marland Kitchen atorvastatin (LIPITOR) 40 MG tablet Take 1 tablet (40 mg total) by mouth daily. 90 tablet 3  . Blood Glucose Monitoring Suppl (BLOOD GLUCOSE MONITOR SYSTEM) w/Device KIT Use to check Blood Sugar up to 4 x daily as directed. Dispense based on pt insurance. E11.9 1 each 0  . diphenhydrAMINE (BENADRYL) 25 mg capsule Take 25 mg by mouth every 8 (eight) hours as needed for allergies or sleep.     . Dulaglutide (TRULICITY) 1.5 DT/1.4HO SOPN Inject content of one pen under the skin weekly on Saturday (Patient taking differently: Inject 1.5 mg as directed every Saturday. ) 4 pen 12  . glucose blood test strip Use to test blood sugars QID 100 each 12  . Lancets Misc. KIT 1 each by Does not apply route 4 (four) times daily as needed. 120 each 11  . metoprolol succinate (TOPROL-XL) 100 MG 24 hr tablet Take 1 tablet (100 mg total) by mouth daily. Take with or immediately following a meal. 90 tablet 1  . multivitamin (RENA-VIT) TABS tablet Take 1 tablet by mouth daily.    . sevelamer carbonate (RENVELA) 800 MG tablet Take 1,600-2,400 mg by mouth See admin instructions. Take 2400 mg with each meal and 1600 mg with each snack    . fluocinonide-emollient (LIDEX-E) 0.05 % cream Apply 1  application topically 2 (two) times daily. To affected areas (Patient taking differently: Apply 1 application topically 2 (two) times daily as needed (skin spots). To affected areas) 60 g 5   No current facility-administered  medications on file prior to visit.     ROS Review of Systems  Constitutional: Negative.   HENT: Negative for congestion.   Eyes: Negative for visual disturbance.  Respiratory: Negative for shortness of breath.   Cardiovascular: Negative for chest pain.  Gastrointestinal: Negative for abdominal pain, constipation, diarrhea, nausea and vomiting.  Genitourinary: Negative for difficulty urinating.  Musculoskeletal: Negative for arthralgias and myalgias.  Neurological: Negative for headaches.  Psychiatric/Behavioral: Negative for sleep disturbance.    Objective:  BP (!) 184/92 (BP Location: Left Wrist, Cuff Size: Small)   Pulse 74   Temp 98.6 F (37 C)   Resp 20   Ht _0  (1.626 m)   Wt 254 lb (115.2 kg)   SpO2 98%   BMI 43.60 kg/m   BP Readings from Last 3 Encounters:  02/16/19 (!) 184/92  01/28/19 132/72  12/24/18 (!) 131/47    Wt Readings from Last 3 Encounters:  02/16/19 254 lb (115.2 kg)  01/28/19 255 lb 4.8 oz (115.8 kg)  12/24/18 265 lb (120.2 kg)     Physical Exam Constitutional:      General: She is not in acute distress.    Appearance: She is well-developed. She is obese.  HENT:     Head: Normocephalic and atraumatic.     Right Ear: External ear normal.     Left Ear: External ear normal.     Nose: Nose normal.  Eyes:     Conjunctiva/sclera: Conjunctivae normal.     Pupils: Pupils are equal, round, and reactive to light.  Neck:     Musculoskeletal: Normal range of motion and neck supple.     Thyroid: No thyromegaly.  Cardiovascular:     Rate and Rhythm: Normal rate and regular rhythm.     Heart sounds: Normal heart sounds. No murmur.  Pulmonary:     Effort: Pulmonary effort is normal. No respiratory distress.     Breath sounds: Normal breath sounds. No wheezing or rales.  Abdominal:     General: Bowel sounds are normal. There is no distension.     Palpations: Abdomen is soft.     Tenderness: There is no abdominal tenderness.  Lymphadenopathy:      Cervical: No cervical adenopathy.  Skin:    General: Skin is warm and dry.  Neurological:     Mental Status: She is alert and oriented to person, place, and time.     Deep Tendon Reflexes: Reflexes are normal and symmetric.  Psychiatric:        Behavior: Behavior normal.        Thought Content: Thought content normal.        Judgment: Judgment normal.       Assessment & Plan:   Shakeena was seen today for medical management of chronic issues, hyperlipidemia, diabetes and hypertension.  Diagnoses and all orders for this visit:  Controlled type 2 diabetes mellitus with chronic kidney disease on chronic dialysis, with long-term current use of insulin ([place]) -     Bayer DCA Hb A1c Waived -     CBC with Differential/Platelet -     blood glucose meter kit and supplies KIT; Dispense based on patient and insurance preference. Use up to four times daily as directed. (FOR ICD-9 250.00, 250.01).  End stage renal  disease on dialysis ([place]) -     CBC with Differential/Platelet  Hypertension secondary to other renal disorders -     CMP14+EGFR -     CBC with Differential/Platelet  Hyperlipidemia associated with type 2 diabetes mellitus (H[facility]) -     CBC with Differential/Platelet -     Lipid panel  Other orders -     Insulin Glargine, 1 Unit Dial, (TOUJEO SOLOSTAR) 300 UNIT/ML SOPN; Inject 100 Units into the skin daily.      I have discontinued Emalie B. Mandler's oxyCODONE-acetaminophen. I have also changed her Insulin Glargine (1 Unit Dial) to [employer]. Additionally, I am having her start on blood glucose meter kit and supplies. Lastly, I am having her maintain her diphenhydrAMINE, sevelamer carbonate, Blood Glucose Monitor System, Lancets Misc., glucose blood, amLODipine, atorvastatin, metoprolol succinate, fluocinonide-emollient, Trulicity, acetaminophen, and multivitamin.  Meds ordered this encounter  Medications  . blood glucose meter kit and supplies KIT    Sig:  Dispense based on patient and insurance preference. Use up to four times daily as directed. (FOR ICD-9 250.00, 250.01).    Dispense:  1 each    Refill:  0    Order Specific Question:   Number of strips    Answer:   100    Order Specific Question:   Number of lancets    Answer:   100  . Insulin Glargine, 1 Unit Dial, (TOUJEO SOLOSTAR) 300 UNIT/ML SOPN    Sig: Inject 100 Units into the skin daily.    Dispense:  10 mL    Refill:  2     Follow-up: Return in about 3 months (around 05/19/2019).  Claretta Fraise, M.D.

## 2019-02-17 DIAGNOSIS — D631 Anemia in chronic kidney disease: Secondary | ICD-10-CM | POA: Diagnosis not present

## 2019-02-17 DIAGNOSIS — N186 End stage renal disease: Secondary | ICD-10-CM | POA: Diagnosis not present

## 2019-02-17 DIAGNOSIS — N2581 Secondary hyperparathyroidism of renal origin: Secondary | ICD-10-CM | POA: Diagnosis not present

## 2019-02-17 DIAGNOSIS — Z23 Encounter for immunization: Secondary | ICD-10-CM | POA: Diagnosis not present

## 2019-02-17 DIAGNOSIS — D509 Iron deficiency anemia, unspecified: Secondary | ICD-10-CM | POA: Diagnosis not present

## 2019-02-17 DIAGNOSIS — Z992 Dependence on renal dialysis: Secondary | ICD-10-CM | POA: Diagnosis not present

## 2019-02-17 LAB — CBC WITH DIFFERENTIAL/PLATELET
Basophils Absolute: 0.1 10*3/uL (ref 0.0–0.2)
Basos: 0 %
EOS (ABSOLUTE): 0.4 10*3/uL (ref 0.0–0.4)
Eos: 3 %
Hematocrit: 36.4 % (ref 34.0–46.6)
Hemoglobin: 11.5 g/dL (ref 11.1–15.9)
Immature Grans (Abs): 0 10*3/uL (ref 0.0–0.1)
Immature Granulocytes: 0 %
Lymphocytes Absolute: 2.5 10*3/uL (ref 0.7–3.1)
Lymphs: 21 %
MCH: 27.1 pg (ref 26.6–33.0)
MCHC: 31.6 g/dL (ref 31.5–35.7)
MCV: 86 fL (ref 79–97)
Monocytes Absolute: 0.6 10*3/uL (ref 0.1–0.9)
Monocytes: 6 %
Neutrophils Absolute: 8 10*3/uL — ABNORMAL HIGH (ref 1.4–7.0)
Neutrophils: 70 %
Platelets: 524 10*3/uL — ABNORMAL HIGH (ref 150–450)
RBC: 4.25 x10E6/uL (ref 3.77–5.28)
RDW: 13.7 % (ref 11.7–15.4)
WBC: 11.6 10*3/uL — ABNORMAL HIGH (ref 3.4–10.8)

## 2019-02-17 LAB — CMP14+EGFR
ALT: 9 IU/L (ref 0–32)
AST: 13 IU/L (ref 0–40)
Albumin/Globulin Ratio: 1.1 — ABNORMAL LOW (ref 1.2–2.2)
Albumin: 4.1 g/dL (ref 3.8–4.8)
Alkaline Phosphatase: 80 IU/L (ref 39–117)
BUN/Creatinine Ratio: 3 — ABNORMAL LOW (ref 12–28)
BUN: 19 mg/dL (ref 8–27)
Bilirubin Total: 0.6 mg/dL (ref 0.0–1.2)
CO2: 26 mmol/L (ref 20–29)
Calcium: 9.5 mg/dL (ref 8.7–10.3)
Chloride: 100 mmol/L (ref 96–106)
Creatinine, Ser: 5.87 mg/dL (ref 0.57–1.00)
GFR calc Af Amer: 8 mL/min/{1.73_m2} — ABNORMAL LOW (ref >59)
GFR calc non Af Amer: 7 mL/min/{1.73_m2} — ABNORMAL LOW (ref >59)
Globulin, Total: 3.7 g/dL (ref 1.5–4.5)
Glucose: 142 mg/dL — ABNORMAL HIGH (ref 65–99)
Potassium: 4.6 mmol/L (ref 3.5–5.2)
Sodium: 141 mmol/L (ref 134–144)
Total Protein: 7.8 g/dL (ref 6.0–8.5)

## 2019-02-17 LAB — LIPID PANEL
Chol/HDL Ratio: 2.8 ratio (ref 0.0–4.4)
Cholesterol, Total: 125 mg/dL (ref 100–199)
HDL: 45 mg/dL (ref >39)
LDL Chol Calc (NIH): 59 mg/dL (ref 0–99)
Triglycerides: 113 mg/dL (ref 0–149)
VLDL Cholesterol Cal: 21 mg/dL (ref 5–40)

## 2019-02-19 DIAGNOSIS — Z23 Encounter for immunization: Secondary | ICD-10-CM | POA: Diagnosis not present

## 2019-02-19 DIAGNOSIS — N186 End stage renal disease: Secondary | ICD-10-CM | POA: Diagnosis not present

## 2019-02-19 DIAGNOSIS — Z992 Dependence on renal dialysis: Secondary | ICD-10-CM | POA: Diagnosis not present

## 2019-02-19 DIAGNOSIS — D631 Anemia in chronic kidney disease: Secondary | ICD-10-CM | POA: Diagnosis not present

## 2019-02-19 DIAGNOSIS — D509 Iron deficiency anemia, unspecified: Secondary | ICD-10-CM | POA: Diagnosis not present

## 2019-02-19 DIAGNOSIS — N2581 Secondary hyperparathyroidism of renal origin: Secondary | ICD-10-CM | POA: Diagnosis not present

## 2019-02-22 DIAGNOSIS — D509 Iron deficiency anemia, unspecified: Secondary | ICD-10-CM | POA: Diagnosis not present

## 2019-02-22 DIAGNOSIS — Z992 Dependence on renal dialysis: Secondary | ICD-10-CM | POA: Diagnosis not present

## 2019-02-22 DIAGNOSIS — Z23 Encounter for immunization: Secondary | ICD-10-CM | POA: Diagnosis not present

## 2019-02-22 DIAGNOSIS — N186 End stage renal disease: Secondary | ICD-10-CM | POA: Diagnosis not present

## 2019-02-22 DIAGNOSIS — D631 Anemia in chronic kidney disease: Secondary | ICD-10-CM | POA: Diagnosis not present

## 2019-02-22 DIAGNOSIS — N2581 Secondary hyperparathyroidism of renal origin: Secondary | ICD-10-CM | POA: Diagnosis not present

## 2019-02-23 ENCOUNTER — Encounter: Payer: Self-pay | Admitting: *Deleted

## 2019-02-23 ENCOUNTER — Other Ambulatory Visit: Payer: Self-pay | Admitting: *Deleted

## 2019-02-23 NOTE — Progress Notes (Signed)
Call from patient and scheduled surgery for 03/04/2019 arrive at 7:30 am or as directed by the hospital.  Instructions reviewed (see letter) and letter faxed to [facility]. Verbalized understanding.

## 2019-02-24 DIAGNOSIS — D631 Anemia in chronic kidney disease: Secondary | ICD-10-CM | POA: Diagnosis not present

## 2019-02-24 DIAGNOSIS — N186 End stage renal disease: Secondary | ICD-10-CM | POA: Diagnosis not present

## 2019-02-24 DIAGNOSIS — Z23 Encounter for immunization: Secondary | ICD-10-CM | POA: Diagnosis not present

## 2019-02-24 DIAGNOSIS — N2581 Secondary hyperparathyroidism of renal origin: Secondary | ICD-10-CM | POA: Diagnosis not present

## 2019-02-24 DIAGNOSIS — Z992 Dependence on renal dialysis: Secondary | ICD-10-CM | POA: Diagnosis not present

## 2019-02-24 DIAGNOSIS — D509 Iron deficiency anemia, unspecified: Secondary | ICD-10-CM | POA: Diagnosis not present

## 2019-02-26 DIAGNOSIS — Z23 Encounter for immunization: Secondary | ICD-10-CM | POA: Diagnosis not present

## 2019-02-26 DIAGNOSIS — Z992 Dependence on renal dialysis: Secondary | ICD-10-CM | POA: Diagnosis not present

## 2019-02-26 DIAGNOSIS — N186 End stage renal disease: Secondary | ICD-10-CM | POA: Diagnosis not present

## 2019-02-26 DIAGNOSIS — D631 Anemia in chronic kidney disease: Secondary | ICD-10-CM | POA: Diagnosis not present

## 2019-02-26 DIAGNOSIS — D509 Iron deficiency anemia, unspecified: Secondary | ICD-10-CM | POA: Diagnosis not present

## 2019-02-26 DIAGNOSIS — N2581 Secondary hyperparathyroidism of renal origin: Secondary | ICD-10-CM | POA: Diagnosis not present

## 2019-02-27 DIAGNOSIS — Z992 Dependence on renal dialysis: Secondary | ICD-10-CM | POA: Diagnosis not present

## 2019-02-27 DIAGNOSIS — N186 End stage renal disease: Secondary | ICD-10-CM | POA: Diagnosis not present

## 2019-03-01 DIAGNOSIS — N2581 Secondary hyperparathyroidism of renal origin: Secondary | ICD-10-CM | POA: Diagnosis not present

## 2019-03-01 DIAGNOSIS — D509 Iron deficiency anemia, unspecified: Secondary | ICD-10-CM | POA: Diagnosis not present

## 2019-03-01 DIAGNOSIS — N186 End stage renal disease: Secondary | ICD-10-CM | POA: Diagnosis not present

## 2019-03-01 DIAGNOSIS — Z992 Dependence on renal dialysis: Secondary | ICD-10-CM | POA: Diagnosis not present

## 2019-03-01 DIAGNOSIS — D631 Anemia in chronic kidney disease: Secondary | ICD-10-CM | POA: Diagnosis not present

## 2019-03-02 ENCOUNTER — Telehealth: Payer: Self-pay | Admitting: *Deleted

## 2019-03-02 ENCOUNTER — Other Ambulatory Visit: Payer: Self-pay

## 2019-03-02 ENCOUNTER — Other Ambulatory Visit (HOSPITAL_COMMUNITY)
Admission: RE | Admit: 2019-03-02 | Discharge: 2019-03-02 | Disposition: A | Discharge status: Home / Self Care | Payer: Medicare Other | Source: Ambulatory Visit | Attending: Surgery | Admitting: Surgery

## 2019-03-02 ENCOUNTER — Encounter: Payer: Self-pay | Admitting: *Deleted

## 2019-03-02 NOTE — Telephone Encounter (Signed)
Call to patient. Surgery rescheduled for 03/18/2019. Reviewed all instructions (perletter) with patient and faxed copy to [facility]. Patient verbalized understanding to call this office if questions.

## 2019-03-02 NOTE — Telephone Encounter (Signed)
Joy at [employer]  received fax.

## 2019-03-03 DIAGNOSIS — N186 End stage renal disease: Secondary | ICD-10-CM | POA: Diagnosis not present

## 2019-03-03 DIAGNOSIS — D631 Anemia in chronic kidney disease: Secondary | ICD-10-CM | POA: Diagnosis not present

## 2019-03-03 DIAGNOSIS — Z992 Dependence on renal dialysis: Secondary | ICD-10-CM | POA: Diagnosis not present

## 2019-03-03 DIAGNOSIS — D509 Iron deficiency anemia, unspecified: Secondary | ICD-10-CM | POA: Diagnosis not present

## 2019-03-03 DIAGNOSIS — N2581 Secondary hyperparathyroidism of renal origin: Secondary | ICD-10-CM | POA: Diagnosis not present

## 2019-03-05 DIAGNOSIS — N186 End stage renal disease: Secondary | ICD-10-CM | POA: Diagnosis not present

## 2019-03-05 DIAGNOSIS — D631 Anemia in chronic kidney disease: Secondary | ICD-10-CM | POA: Diagnosis not present

## 2019-03-05 DIAGNOSIS — Z992 Dependence on renal dialysis: Secondary | ICD-10-CM | POA: Diagnosis not present

## 2019-03-05 DIAGNOSIS — D509 Iron deficiency anemia, unspecified: Secondary | ICD-10-CM | POA: Diagnosis not present

## 2019-03-05 DIAGNOSIS — N2581 Secondary hyperparathyroidism of renal origin: Secondary | ICD-10-CM | POA: Diagnosis not present

## 2019-03-08 DIAGNOSIS — Z992 Dependence on renal dialysis: Secondary | ICD-10-CM | POA: Diagnosis not present

## 2019-03-08 DIAGNOSIS — N186 End stage renal disease: Secondary | ICD-10-CM | POA: Diagnosis not present

## 2019-03-08 DIAGNOSIS — D509 Iron deficiency anemia, unspecified: Secondary | ICD-10-CM | POA: Diagnosis not present

## 2019-03-08 DIAGNOSIS — N2581 Secondary hyperparathyroidism of renal origin: Secondary | ICD-10-CM | POA: Diagnosis not present

## 2019-03-08 DIAGNOSIS — D631 Anemia in chronic kidney disease: Secondary | ICD-10-CM | POA: Diagnosis not present

## 2019-03-10 DIAGNOSIS — Z992 Dependence on renal dialysis: Secondary | ICD-10-CM | POA: Diagnosis not present

## 2019-03-10 DIAGNOSIS — D509 Iron deficiency anemia, unspecified: Secondary | ICD-10-CM | POA: Diagnosis not present

## 2019-03-10 DIAGNOSIS — N2581 Secondary hyperparathyroidism of renal origin: Secondary | ICD-10-CM | POA: Diagnosis not present

## 2019-03-10 DIAGNOSIS — D631 Anemia in chronic kidney disease: Secondary | ICD-10-CM | POA: Diagnosis not present

## 2019-03-10 DIAGNOSIS — N186 End stage renal disease: Secondary | ICD-10-CM | POA: Diagnosis not present

## 2019-03-12 DIAGNOSIS — D509 Iron deficiency anemia, unspecified: Secondary | ICD-10-CM | POA: Diagnosis not present

## 2019-03-12 DIAGNOSIS — D631 Anemia in chronic kidney disease: Secondary | ICD-10-CM | POA: Diagnosis not present

## 2019-03-12 DIAGNOSIS — N186 End stage renal disease: Secondary | ICD-10-CM | POA: Diagnosis not present

## 2019-03-12 DIAGNOSIS — Z992 Dependence on renal dialysis: Secondary | ICD-10-CM | POA: Diagnosis not present

## 2019-03-12 DIAGNOSIS — N2581 Secondary hyperparathyroidism of renal origin: Secondary | ICD-10-CM | POA: Diagnosis not present

## 2019-03-15 DIAGNOSIS — N186 End stage renal disease: Secondary | ICD-10-CM | POA: Diagnosis not present

## 2019-03-15 DIAGNOSIS — N2581 Secondary hyperparathyroidism of renal origin: Secondary | ICD-10-CM | POA: Diagnosis not present

## 2019-03-15 DIAGNOSIS — D509 Iron deficiency anemia, unspecified: Secondary | ICD-10-CM | POA: Diagnosis not present

## 2019-03-15 DIAGNOSIS — D631 Anemia in chronic kidney disease: Secondary | ICD-10-CM | POA: Diagnosis not present

## 2019-03-15 DIAGNOSIS — Z992 Dependence on renal dialysis: Secondary | ICD-10-CM | POA: Diagnosis not present

## 2019-03-16 ENCOUNTER — Other Ambulatory Visit (HOSPITAL_COMMUNITY)
Admission: RE | Admit: 2019-03-16 | Discharge: 2019-03-16 | Disposition: A | Discharge status: Home / Self Care | Payer: Medicare Other | Source: Ambulatory Visit | Attending: Surgery | Admitting: Surgery

## 2019-03-16 ENCOUNTER — Other Ambulatory Visit: Payer: Self-pay

## 2019-03-16 DIAGNOSIS — Z01812 Encounter for preprocedural laboratory examination: Secondary | ICD-10-CM | POA: Insufficient documentation

## 2019-03-16 DIAGNOSIS — Z20828 Contact with and (suspected) exposure to other viral communicable diseases: Secondary | ICD-10-CM | POA: Diagnosis not present

## 2019-03-16 LAB — SARS CORONAVIRUS 2 (TAT 6-24 HRS): SARS Coronavirus 2: NEGATIVE

## 2019-03-17 ENCOUNTER — Encounter (HOSPITAL_COMMUNITY): Payer: Self-pay | Admitting: *Deleted

## 2019-03-17 ENCOUNTER — Other Ambulatory Visit: Payer: Self-pay

## 2019-03-17 DIAGNOSIS — D509 Iron deficiency anemia, unspecified: Secondary | ICD-10-CM | POA: Diagnosis not present

## 2019-03-17 DIAGNOSIS — Z992 Dependence on renal dialysis: Secondary | ICD-10-CM | POA: Diagnosis not present

## 2019-03-17 DIAGNOSIS — N186 End stage renal disease: Secondary | ICD-10-CM | POA: Diagnosis not present

## 2019-03-17 DIAGNOSIS — N2581 Secondary hyperparathyroidism of renal origin: Secondary | ICD-10-CM | POA: Diagnosis not present

## 2019-03-17 DIAGNOSIS — D631 Anemia in chronic kidney disease: Secondary | ICD-10-CM | POA: Diagnosis not present

## 2019-03-17 NOTE — Progress Notes (Signed)
Patient denies shortness of breath, fever, cough and chest pain.  PCP - Dr Claretta Fraise Cardiologist - denies  Chest x-ray - denies EKG - 12/08/18 Stress Test - denies* ECHO - denies* Cardiac Cath - denies * Requested back in 11/2018 but [facility] says but no treatment at [facility].  See [facility] note 12/25/18.  Fasting Blood Sugar - Does not check sugars Checks Blood Sugar 0__ times a day  . THE MORNING OF SURGERY, take 50 units of insulin glargine.  . If your blood sugar is less than 70 mg/dL, you will need to treat for low blood sugar: o Treat a low blood sugar (less than 70 mg/dL) with  cup of clear juice (cranberry or apple) Recheck blood sugar in 15 minutes after treatment (to make sure it is greater than 70 mg/dL). If your blood sugar is not greater than 70 mg/dL on recheck, call [phone] for further instructions.  Anesthesia review: Yes, James PA  STOP now taking any Aspirin (unless otherwise instructed by your surgeon), Aleve, Naproxen, Ibuprofen, Motrin, Advil, Goody's, BC's, all herbal medications, fish oil, and all vitamins.   Coronavirus Screening Covid test on 03/16/19 was negative.  Patient verbalized understanding of instructions that were given via phone.

## 2019-03-18 ENCOUNTER — Encounter (HOSPITAL_COMMUNITY): Payer: Self-pay

## 2019-03-18 ENCOUNTER — Ambulatory Visit (HOSPITAL_COMMUNITY): Payer: Medicare Other | Admitting: Physician Assistant

## 2019-03-18 ENCOUNTER — Ambulatory Visit (HOSPITAL_COMMUNITY)
Admission: RE | Admit: 2019-03-18 | Discharge: 2019-03-18 | Disposition: A | Discharge status: Home / Self Care | Payer: Medicare Other | Attending: Surgery | Admitting: Surgery

## 2019-03-18 ENCOUNTER — Other Ambulatory Visit: Payer: Self-pay

## 2019-03-18 ENCOUNTER — Encounter (HOSPITAL_COMMUNITY)
Admission: RE | Disposition: A | Discharge status: Home / Self Care | Payer: Self-pay | Source: Home / Self Care | Attending: Surgery

## 2019-03-18 DIAGNOSIS — I130 Hypertensive heart and chronic kidney disease with heart failure and stage 1 through stage 4 chronic kidney disease, or unspecified chronic kidney disease: Secondary | ICD-10-CM | POA: Diagnosis not present

## 2019-03-18 DIAGNOSIS — D631 Anemia in chronic kidney disease: Secondary | ICD-10-CM | POA: Diagnosis not present

## 2019-03-18 DIAGNOSIS — Z79899 Other long term (current) drug therapy: Secondary | ICD-10-CM | POA: Insufficient documentation

## 2019-03-18 DIAGNOSIS — J4530 Mild persistent asthma, uncomplicated: Secondary | ICD-10-CM | POA: Insufficient documentation

## 2019-03-18 DIAGNOSIS — N186 End stage renal disease: Secondary | ICD-10-CM

## 2019-03-18 DIAGNOSIS — Z794 Long term (current) use of insulin: Secondary | ICD-10-CM | POA: Diagnosis not present

## 2019-03-18 DIAGNOSIS — E1122 Type 2 diabetes mellitus with diabetic chronic kidney disease: Secondary | ICD-10-CM | POA: Insufficient documentation

## 2019-03-18 DIAGNOSIS — Z6841 Body Mass Index (BMI) 40.0 and over, adult: Secondary | ICD-10-CM | POA: Insufficient documentation

## 2019-03-18 DIAGNOSIS — Z992 Dependence on renal dialysis: Secondary | ICD-10-CM | POA: Insufficient documentation

## 2019-03-18 DIAGNOSIS — I509 Heart failure, unspecified: Secondary | ICD-10-CM | POA: Insufficient documentation

## 2019-03-18 DIAGNOSIS — E785 Hyperlipidemia, unspecified: Secondary | ICD-10-CM | POA: Insufficient documentation

## 2019-03-18 DIAGNOSIS — N185 Chronic kidney disease, stage 5: Secondary | ICD-10-CM | POA: Diagnosis not present

## 2019-03-18 DIAGNOSIS — I132 Hypertensive heart and chronic kidney disease with heart failure and with stage 5 chronic kidney disease, or end stage renal disease: Secondary | ICD-10-CM | POA: Diagnosis not present

## 2019-03-18 DIAGNOSIS — M1990 Unspecified osteoarthritis, unspecified site: Secondary | ICD-10-CM | POA: Diagnosis not present

## 2019-03-18 DIAGNOSIS — Z87891 Personal history of nicotine dependence: Secondary | ICD-10-CM | POA: Diagnosis not present

## 2019-03-18 HISTORY — PX: AV FISTULA PLACEMENT: SHX1204

## 2019-03-18 HISTORY — DX: Hyperlipidemia, unspecified: E78.5

## 2019-03-18 LAB — GLUCOSE, CAPILLARY
Glucose-Capillary: 220 mg/dL — ABNORMAL HIGH (ref 70–99)
Glucose-Capillary: 161 mg/dL — ABNORMAL HIGH (ref 70–99)
Glucose-Capillary: 153 mg/dL — ABNORMAL HIGH (ref 70–99)

## 2019-03-18 LAB — POCT I-STAT, CHEM 8
BUN: 27 mg/dL — ABNORMAL HIGH (ref 8–23)
Calcium, Ion: 1.04 mmol/L — ABNORMAL LOW (ref 1.15–1.40)
Chloride: 98 mmol/L (ref 98–111)
Creatinine, Ser: 5.9 mg/dL — ABNORMAL HIGH (ref 0.44–1.00)
Glucose, Bld: 217 mg/dL — ABNORMAL HIGH (ref 70–99)
HCT: 35 % — ABNORMAL LOW (ref 36.0–46.0)
Hemoglobin: 11.9 g/dL — ABNORMAL LOW (ref 12.0–15.0)
Potassium: 4.5 mmol/L (ref 3.5–5.1)
Sodium: 136 mmol/L (ref 135–145)
TCO2: 27 mmol/L (ref 22–32)

## 2019-03-18 SURGERY — INSERTION OF ARTERIOVENOUS (AV) GORE-TEX GRAFT ARM
Anesthesia: General | Laterality: Right

## 2019-03-18 MED ORDER — GLYCOPYRROLATE 0.2 MG/ML IJ SOLN
INTRAMUSCULAR | Status: DC | PRN
  Administered 2019-03-18: .8 mg via INTRAVENOUS

## 2019-03-18 MED ORDER — HEPARIN SODIUM (PORCINE) 1000 UNIT/ML IJ SOLN
INTRAMUSCULAR | Status: AC
  Filled 2019-03-18: qty 2

## 2019-03-18 MED ORDER — OXYCODONE HCL 5 MG/5ML PO SOLN: 5.0000 mg | Freq: Once | ORAL | Status: AC | PRN

## 2019-03-18 MED ORDER — ROCURONIUM 10MG/ML (10ML) SYRINGE FOR MEDFUSION PUMP - OPTIME
INTRAVENOUS | Status: DC | PRN
  Administered 2019-03-18: 30 mg via INTRAVENOUS

## 2019-03-18 MED ORDER — HEPARIN SODIUM (PORCINE) 1000 UNIT/ML IJ SOLN
INTRAMUSCULAR | Status: DC | PRN
  Administered 2019-03-18: 3000 [IU] via INTRAVENOUS

## 2019-03-18 MED ORDER — SODIUM CHLORIDE 0.9 % IV SOLN
INTRAVENOUS | Status: AC
  Filled 2019-03-18: qty 1.2

## 2019-03-18 MED ORDER — ACETAMINOPHEN 500 MG PO TABS: 1000.0000 mg | ORAL_TABLET | Freq: Once | ORAL | Status: DC | PRN

## 2019-03-18 MED ORDER — ROCURONIUM BROMIDE 100 MG/10ML IV SOLN: INTRAVENOUS | Status: DC | PRN

## 2019-03-18 MED ORDER — SODIUM CHLORIDE 0.9 % IV SOLN
INTRAVENOUS | Status: DC
  Administered 2019-03-18 (×2): via INTRAVENOUS

## 2019-03-18 MED ORDER — ACETAMINOPHEN 160 MG/5ML PO SOLN: 1000.0000 mg | Freq: Once | ORAL | Status: DC | PRN

## 2019-03-18 MED ORDER — CHLORHEXIDINE GLUCONATE 4 % EX LIQD: 60.0000 mL | Freq: Once | CUTANEOUS | Status: DC

## 2019-03-18 MED ORDER — DEXAMETHASONE SODIUM PHOSPHATE 10 MG/ML IJ SOLN
INTRAMUSCULAR | Status: DC | PRN
  Administered 2019-03-18: 4 mg via INTRAVENOUS

## 2019-03-18 MED ORDER — GLYCOPYRROLATE PF 0.2 MG/ML IJ SOSY
PREFILLED_SYRINGE | INTRAMUSCULAR | Status: AC
  Filled 2019-03-18: qty 3

## 2019-03-18 MED ORDER — CEFAZOLIN SODIUM-DEXTROSE 2-4 GM/100ML-% IV SOLN
INTRAVENOUS | Status: AC
  Filled 2019-03-18: qty 100

## 2019-03-18 MED ORDER — SODIUM CHLORIDE 0.9 % IV SOLN
INTRAVENOUS | Status: DC | PRN
  Administered 2019-03-18: 500 mL

## 2019-03-18 MED ORDER — SUCCINYLCHOLINE CHLORIDE 20 MG/ML IJ SOLN
INTRAMUSCULAR | Status: DC | PRN
  Administered 2019-03-18: 140 mg via INTRAVENOUS

## 2019-03-18 MED ORDER — LIDOCAINE HCL (CARDIAC) PF 100 MG/5ML IV SOSY
PREFILLED_SYRINGE | INTRAVENOUS | Status: DC | PRN
  Administered 2019-03-18: 100 mg via INTRAVENOUS

## 2019-03-18 MED ORDER — ACETAMINOPHEN 10 MG/ML IV SOLN: 1000.0000 mg | Freq: Once | INTRAVENOUS | Status: DC | PRN

## 2019-03-18 MED ORDER — ONDANSETRON HCL 4 MG/2ML IJ SOLN
INTRAMUSCULAR | Status: DC | PRN
  Administered 2019-03-18: 4 mg via INTRAVENOUS

## 2019-03-18 MED ORDER — ONDANSETRON HCL 4 MG/2ML IJ SOLN
INTRAMUSCULAR | Status: AC
  Filled 2019-03-18: qty 2

## 2019-03-18 MED ORDER — PROTAMINE SULFATE 10 MG/ML IV SOLN
INTRAVENOUS | Status: AC
  Filled 2019-03-18: qty 5

## 2019-03-18 MED ORDER — PROPOFOL 10 MG/ML IV BOLUS
INTRAVENOUS | Status: AC
  Filled 2019-03-18: qty 20

## 2019-03-18 MED ORDER — PROTAMINE SULFATE 10 MG/ML IV SOLN
INTRAVENOUS | Status: DC | PRN
  Administered 2019-03-18: 25 mg via INTRAVENOUS

## 2019-03-18 MED ORDER — ONDANSETRON HCL 4 MG/2ML IJ SOLN: 4.0000 mg | Freq: Once | INTRAMUSCULAR | Status: DC | PRN

## 2019-03-18 MED ORDER — PHENYLEPHRINE HCL-NACL 10-0.9 MG/250ML-% IV SOLN
INTRAVENOUS | Status: DC | PRN
  Administered 2019-03-18: 30 ug/min via INTRAVENOUS

## 2019-03-18 MED ORDER — LACTATED RINGERS IV SOLN: INTRAVENOUS | Status: DC | PRN

## 2019-03-18 MED ORDER — FENTANYL CITRATE (PF) 100 MCG/2ML IJ SOLN: 25.0000 ug | INTRAMUSCULAR | Status: DC | PRN

## 2019-03-18 MED ORDER — CEFAZOLIN SODIUM-DEXTROSE 2-4 GM/100ML-% IV SOLN
2.0000 g | INTRAVENOUS | Status: AC
  Administered 2019-03-18: 2 g via INTRAVENOUS

## 2019-03-18 MED ORDER — OXYCODONE HCL 5 MG PO TABS
ORAL_TABLET | ORAL | Status: AC
  Filled 2019-03-18: qty 1

## 2019-03-18 MED ORDER — NEOSTIGMINE METHYLSULFATE 10 MG/10ML IV SOLN
INTRAVENOUS | Status: DC | PRN
  Administered 2019-03-18: 5 mg via INTRAVENOUS

## 2019-03-18 MED ORDER — LIDOCAINE-EPINEPHRINE 1 %-1:100000 IJ SOLN
INTRAMUSCULAR | Status: AC
  Filled 2019-03-18: qty 1

## 2019-03-18 MED ORDER — OXYCODONE HCL 5 MG PO TABS
5.0000 mg | ORAL_TABLET | Freq: Once | ORAL | Status: AC | PRN
  Administered 2019-03-18: 5 mg via ORAL

## 2019-03-18 MED ORDER — PROPOFOL 10 MG/ML IV BOLUS
INTRAVENOUS | Status: DC | PRN
  Administered 2019-03-18: 50 mg via INTRAVENOUS
  Administered 2019-03-18: 150 mg via INTRAVENOUS

## 2019-03-18 MED ORDER — DEXAMETHASONE SODIUM PHOSPHATE 10 MG/ML IJ SOLN
INTRAMUSCULAR | Status: AC
  Filled 2019-03-18: qty 1

## 2019-03-18 MED ORDER — FENTANYL CITRATE (PF) 250 MCG/5ML IJ SOLN
INTRAMUSCULAR | Status: AC
  Filled 2019-03-18: qty 5

## 2019-03-18 MED ORDER — NEOSTIGMINE METHYLSULFATE 3 MG/3ML IV SOSY
PREFILLED_SYRINGE | INTRAVENOUS | Status: AC
  Filled 2019-03-18: qty 6

## 2019-03-18 MED ORDER — PHENYLEPHRINE 40 MCG/ML (10ML) SYRINGE FOR IV PUSH (FOR BLOOD PRESSURE SUPPORT)
PREFILLED_SYRINGE | INTRAVENOUS | Status: AC
  Filled 2019-03-18: qty 10

## 2019-03-18 MED ORDER — PHENYLEPHRINE HCL (PRESSORS) 10 MG/ML IV SOLN
INTRAVENOUS | Status: DC | PRN
  Administered 2019-03-18 (×2): 120 ug via INTRAVENOUS

## 2019-03-18 MED ORDER — HEPARIN SODIUM (PORCINE) 1000 UNIT/ML IJ SOLN
INTRAMUSCULAR | Status: AC
  Filled 2019-03-18: qty 1

## 2019-03-18 MED ORDER — HEMOSTATIC AGENTS (NO CHARGE) OPTIME
TOPICAL | Status: DC | PRN
  Administered 2019-03-18: 1 via TOPICAL

## 2019-03-18 MED ORDER — OXYCODONE-ACETAMINOPHEN 5-325 MG PO TABS: 1.0000 | ORAL_TABLET | Freq: Four times a day (QID) | ORAL | 0 refills | Status: DC | PRN

## 2019-03-18 MED ORDER — FENTANYL CITRATE (PF) 100 MCG/2ML IJ SOLN
INTRAMUSCULAR | Status: DC | PRN
  Administered 2019-03-18: 100 ug via INTRAVENOUS
  Administered 2019-03-18 (×2): 50 ug via INTRAVENOUS

## 2019-03-18 SURGICAL SUPPLY — 33 items
ARMBAND PINK RESTRICT EXTREMIT (MISCELLANEOUS) ×2 IMPLANT
BLADE CLIPPER SENSICLIP SURGIC (BLADE) ×2 IMPLANT
CANISTER SUCT 3000ML PPV (MISCELLANEOUS) ×2 IMPLANT
CLIP VESOCCLUDE MED 6/CT (CLIP) ×2 IMPLANT
CLIP VESOCCLUDE SM WIDE 6/CT (CLIP) ×2 IMPLANT
COVER WAND RF STERILE (DRAPES) IMPLANT
DERMABOND ADVANCED (GAUZE/BANDAGES/DRESSINGS) ×1
DERMABOND ADVANCED .7 DNX12 (GAUZE/BANDAGES/DRESSINGS) ×1 IMPLANT
ELECT REM PT RETURN 9FT ADLT (ELECTROSURGICAL) ×2
ELECTRODE REM PT RTRN 9FT ADLT (ELECTROSURGICAL) ×1 IMPLANT
GLOVE BIOGEL PI IND STRL 7.5 (GLOVE) ×1 IMPLANT
GLOVE BIOGEL PI INDICATOR 7.5 (GLOVE) ×1
GLOVE SURG SS PI 7.5 STRL IVOR (GLOVE) ×2 IMPLANT
GOWN STRL REUS W/ TWL LRG LVL3 (GOWN DISPOSABLE) ×2 IMPLANT
GOWN STRL REUS W/ TWL XL LVL3 (GOWN DISPOSABLE) ×1 IMPLANT
GOWN STRL REUS W/TWL LRG LVL3 (GOWN DISPOSABLE) ×2
GOWN STRL REUS W/TWL XL LVL3 (GOWN DISPOSABLE) ×1
GRAFT GORETEX STRT 6X50 (Vascular Products) ×2 IMPLANT
HEMOSTAT SNOW SURGICEL 2X4 (HEMOSTASIS) ×2 IMPLANT
KIT BASIN OR (CUSTOM PROCEDURE TRAY) ×2 IMPLANT
KIT TURNOVER KIT B (KITS) ×2 IMPLANT
NS IRRIG 1000ML POUR BTL (IV SOLUTION) ×2 IMPLANT
PACK CV ACCESS (CUSTOM PROCEDURE TRAY) ×2 IMPLANT
PAD ARMBOARD 7.5X6 YLW CONV (MISCELLANEOUS) ×4 IMPLANT
SUT PROLENE 6 0 BV (SUTURE) ×10 IMPLANT
SUT SILK 2 0 PERMA HAND 18 BK (SUTURE) ×2 IMPLANT
SUT VIC AB 3-0 SH 27 (SUTURE) ×2
SUT VIC AB 3-0 SH 27X BRD (SUTURE) ×2 IMPLANT
SUT VICRYL 4-0 PS2 18IN ABS (SUTURE) ×2 IMPLANT
SYR TOOMEY 50ML (SYRINGE) IMPLANT
TOWEL GREEN STERILE (TOWEL DISPOSABLE) ×2 IMPLANT
UNDERPAD 30X30 (UNDERPADS AND DIAPERS) ×2 IMPLANT
WATER STERILE IRR 1000ML POUR (IV SOLUTION) ×2 IMPLANT

## 2019-03-18 NOTE — Op Note (Signed)
° ° °  Patient name: Christy Zhang MRN: [id number] DOB: 1955-06-03 Sex: female  03/18/2019 Pre-operative Diagnosis: End-stage renal disease Post-operative diagnosis:  Same Surgeon:  Annamarie Major Assistants: Aldona Bar Ryne Procedure:   Right upper arm loop graft (6 mm) Anesthesia: General Blood Loss: Minimal Specimens: None  Findings: The axillary artery was heavily calcified.  The venous anastomosis was end to side.  The arterial anastomosis is superior to the venous anastomosis.  Indications: The patient has previously undergone a basilic vein fistula creation which failed early.  At the time of her operation, her brachial artery was noted to be heavily calcified.  She comes in today for upper extremity loop graft.  Procedure:  The patient was identified in the holding area and taken to [place] [facility]  The patient was then placed supine on the table. general anesthesia was administered.  The patient was prepped and draped in the usual sterile fashion.  A time out was called and antibiotics were administered.  A longitudinal incision was made up near the axilla.  Cautery was used about subcutaneous tissue and fascia.  I exposed the proximal brachial artery.  This was circumferentially calcified.  It was fully mobilized within the incision and encircled with Vesseloops.  The brachial vein was then dissected out.  This was inferior to the artery.  Once adequate exposure was performed, I made a counterincision just above the antecubital crease and then created a subcutaneous tunnel with a curved tunneler.  A 6 mm propatent PTFE graft was brought through the tunnel.  The patient was given 3000 units of heparin.  After the heparin was circulated, the brachial artery was occluded with vascular clamps and a #11 blade was used to make an arteriotomy which was extended with Potts scissors.  There was no significant stenosis within the artery despite its circumferential calcification.  The graft was  beveled to fit the size of the arteriotomy and a running anastomosis was created with 6-0 Prolene.  The clamps were released.  There was excellent flow through the graft.  The graft was flushed with heparin saline and reoccluded.  I then occluded the vein with vascular clamps and used a #11 blade to make a venotomy which extended longitudinally with Potts scissors.  I did encounter a large valve which was removed with Potts scissors.  The graft was pulled to the appropriate length and then beveled to fit the size of the venotomy.  A running anastomosis was created with 6-0 Prolene.  Prior to completion the appropriate flushing maneuvers were performed and the anastomosis was completed.  2 repair stitches were required for hemostasis.  25 mg of protamine was administered.  The patient had a brisk radial artery Doppler signal which did not change significantly with graft compression.  There was an excellent thrill within the graft.  The counterincision was irrigated and hemostasis was achieved.  It was closed with 2 layers of 3-0 Vicryl.  The incision in the axilla was closed by reapproximating the fascia and subcutaneous tissue with 3-0 Vicryl followed by 3-0 Vicryl and the skin.  Dermabond was applied.  Patient was successfully extubated and taken to recovery in stable condition.  There were no immediate complications.   Disposition: To PACU stable.   Theotis Burrow, M.D., [facility] [place] Office: [phone] Pager:  [phone]

## 2019-03-18 NOTE — Transfer of Care (Signed)
Immediate Anesthesia Transfer of Care Note  Patient: Christy Zhang [facility]  Procedure(s) Performed: INSERTION OF ARTERIOVENOUS (AV) GORE-TEX GRAFT RIGHT ARM (Right )  Patient Location: PACU  Anesthesia Type:General  Level of Consciousness: awake and alert   Airway & Oxygen Therapy: Patient Spontanous Breathing and Patient connected to face mask oxygen  Post-op Assessment: Report given to RN and Post -op Vital signs reviewed and stable  Post vital signs: Reviewed and stable  Last Vitals:  Vitals Value Taken Time  BP 147/62 03/18/19 1245  Temp    Pulse 81 03/18/19 1248  Resp 16 03/18/19 1248  SpO2 100 % 03/18/19 1248  Vitals shown include unvalidated device data.  Last Pain:  Vitals:   03/18/19 0757  TempSrc:   PainSc: 0-No pain         Complications: No apparent anesthesia complications

## 2019-03-18 NOTE — H&P (Signed)
   Patient name: Ashlie Lindle MRN: [id number] DOB: April 20, 1956 Sex: female    HISTORY OF PRESENT ILLNESS:   Lihanna Unruh is a 63 y.o. female with ESRD in need of new access.  CURRENT MEDICATIONS:    Current Facility-Administered Medications  Medication Dose Route Frequency Provider Last Rate Last Dose  . 0.9 %  sodium chloride infusion   Intravenous Continuous Serafina Mitchell, MD 10 mL/hr at 03/18/19 [phone]    . ceFAZolin (ANCEF) 2-4 GM/100ML-% IVPB           . ceFAZolin (ANCEF) IVPB 2g/100 mL premix  2 g Intravenous 30 min Pre-Op Serafina Mitchell, MD      . chlorhexidine (HIBICLENS) 4 % liquid 4 application  60 mL Topical Once Serafina Mitchell, MD       And  . [name] ON 03/19/2019] chlorhexidine (HIBICLENS) 4 % liquid 4 application  60 mL Topical Once Serafina Mitchell, MD        REVIEW OF SYSTEMS:   [X]  denotes positive finding, [ ]  denotes negative finding Cardiac  Comments:  Chest pain or chest pressure:    Shortness of breath upon exertion:    Short of breath when lying flat:    Irregular heart rhythm:    Constitutional    Fever or chills:      PHYSICAL EXAM:   Vitals:   03/17/19 1723 03/18/19 0749  BP:  (!) 174/69  Pulse:  75  Resp:  18  Temp:  98.3 F (36.8 C)  TempSrc:  Oral  SpO2:  100%  Weight: 111.1 kg 113.4 kg  Height: 5\' 4"  (1.626 m) 5\' 4"  (1.626 m)    GENERAL: The patient is a well-nourished female, in no acute distress. The vital signs are documented above. CARDIOVASCULAR: There is a regular rate and rhythm. PULMONARY: Non-labored respirations   STUDIES:   none   MEDICAL ISSUES:   Her recent right BVT has failed as well as her BCF.  Her brachial artery was very calcified.  She needs a graft, and so her best option will be a upper arm loop graft..  All questions answered  Leia Alf, MD, FACS [facility] [phone] Pager [phone]

## 2019-03-18 NOTE — Discharge Instructions (Signed)
° °  [facility]  Discharge Instructions  AV Fistula or Graft Surgery for Dialysis Access  Please refer to the following instructions for your post-procedure care. Your surgeon or physician assistant will discuss any changes with you.  Activity  You may drive the day following your surgery, if you are comfortable and no longer taking prescription pain medication. Resume full activity as the soreness in your incision resolves.  Bathing/Showering  You may shower after you go home. Keep your incision dry for 48 hours. Do not soak in a bathtub, hot tub, or swim until the incision heals completely. You may not shower if you have a hemodialysis catheter.  Incision Care  Clean your incision with mild soap and water after 48 hours. Pat the area dry with a clean towel. You do not need a bandage unless otherwise instructed. Do not apply any ointments or creams to your incision. You may have skin glue on your incision. Do not peel it off. It will come off on its own in about one week. Your arm may swell a bit after surgery. To reduce swelling use pillows to elevate your arm so it is above your heart. Your doctor will tell you if you need to lightly wrap your arm with an ACE bandage.  Diet  Resume your normal diet. There are not special food restrictions following this procedure. In order to heal from your surgery, it is CRITICAL to get adequate nutrition. Your body requires vitamins, minerals, and protein. Vegetables are the best source of vitamins and minerals. Vegetables also provide the perfect balance of protein. Processed food has little nutritional value, so try to avoid this.  Medications  Resume taking all of your medications. If your incision is causing pain, you may take over-the counter pain relievers such as acetaminophen (Tylenol). If you were prescribed a stronger pain medication, please be aware these medications can cause nausea and constipation. Prevent  nausea by taking the medication with a snack or meal. Avoid constipation by drinking plenty of fluids and eating foods with high amount of fiber, such as fruits, vegetables, and grains.  Do not take Tylenol if you are taking prescription pain medications.  Follow up Your surgeon may want to see you in the office following your access surgery. If so, this will be arranged at the time of your surgery.  Please call us immediately for any of the following conditions:  Increased pain, redness, drainage (pus) from your incision site Fever of 101 degrees or higher Severe or worsening pain at your incision site Hand pain or numbness.  Reduce your risk of vascular disease:  Stop smoking. If you would like help, call [facility] at 1-800-QUIT-NOW (810) 591-8153) or [place] at [place] your cholesterol Maintain a desired weight Control your diabetes Keep your blood pressure down  Dialysis  It will take several weeks to several months for your new dialysis access to be ready for use. Your surgeon will determine when it is okay to use it. Your nephrologist will continue to direct your dialysis. You can continue to use your Permcath until your new access is ready for use.   03/18/2019 Christy Zhang [facility] [id number] [birth year]  Surgeon(s): Serafina Mitchell, MD  Procedure(s): INSERTION OF ARTERIOVENOUS (AV) GORE-TEX GRAFT RIGHT ARM  x Do not stick graft for 4 weeks    If you have any questions, please call the office at [phone].

## 2019-03-18 NOTE — Anesthesia Preprocedure Evaluation (Signed)
Anesthesia Evaluation  Patient identified by MRN, date of birth, ID band Patient awake    Reviewed: Allergy & Precautions, NPO status , Patient's Chart, lab work & pertinent test results  History of Anesthesia Complications Negative for: history of anesthetic complications  Airway Mallampati: II  TM Distance: >3 FB Neck ROM: Full    Dental  (+) Dental Advisory Given, Poor Dentition   Pulmonary asthma , former smoker,    breath sounds clear to auscultation       Cardiovascular hypertension, Pt. on medications and Pt. on home beta blockers (-) angina+CHF  (-) Past MI  Rhythm:Regular     Neuro/Psych  Neuromuscular disease negative psych ROS   GI/Hepatic negative GI ROS, Neg liver ROS,   Endo/Other  diabetes, Insulin DependentMorbid obesity  Renal/GU ESRF and DialysisRenal diseaseHD yesterday     Musculoskeletal  (+) Arthritis ,   Abdominal   Peds  Hematology  (+) Blood dyscrasia, anemia ,   Anesthesia Other Findings   Reproductive/Obstetrics                             Anesthesia Physical Anesthesia Plan  ASA: III  Anesthesia Plan: General   Post-op Pain Management:    Induction: Intravenous  PONV Risk Score and Plan: 3 and Ondansetron and Dexamethasone  Airway Management Planned: LMA and Oral ETT  Additional Equipment: None  Intra-op Plan:   Post-operative Plan: Extubation in OR  Informed Consent: I have reviewed the patients History and Physical, chart, labs and discussed the procedure including the risks, benefits and alternatives for the proposed anesthesia with the patient or authorized representative who has indicated his/her understanding and acceptance.     Dental advisory given  Plan Discussed with: CRNA and Surgeon  Anesthesia Plan Comments:         Anesthesia Quick Evaluation

## 2019-03-18 NOTE — Progress Notes (Signed)
Spoke to Dr. Ermalene Postin regarding BG 220.  No order received.

## 2019-03-18 NOTE — Anesthesia Procedure Notes (Signed)
Procedure Name: Intubation Date/Time: 03/18/2019 10:49 AM Performed by: Inda Coke, CRNA Pre-anesthesia Checklist: Patient identified, Emergency Drugs available, Suction available and Patient being monitored Patient Re-evaluated:Patient Re-evaluated prior to induction Oxygen Delivery Method: Circle System Utilized Preoxygenation: Pre-oxygenation with 100% oxygen Induction Type: IV induction Ventilation: Mask ventilation without difficulty Laryngoscope Size: Miller and 3 Grade View: Grade I Tube type: Oral Number of attempts: 1 Airway Equipment and Method: Stylet and Oral airway Placement Confirmation: ETT inserted through vocal cords under direct vision,  positive ETCO2 and breath sounds checked- equal and bilateral Secured at: 22 cm Tube secured with: Tape Dental Injury: Teeth and Oropharynx as per pre-operative assessment

## 2019-03-19 ENCOUNTER — Encounter (HOSPITAL_COMMUNITY): Payer: Self-pay | Admitting: Surgery

## 2019-03-19 DIAGNOSIS — N2581 Secondary hyperparathyroidism of renal origin: Secondary | ICD-10-CM | POA: Diagnosis not present

## 2019-03-19 DIAGNOSIS — D509 Iron deficiency anemia, unspecified: Secondary | ICD-10-CM | POA: Diagnosis not present

## 2019-03-19 DIAGNOSIS — D631 Anemia in chronic kidney disease: Secondary | ICD-10-CM | POA: Diagnosis not present

## 2019-03-19 DIAGNOSIS — Z992 Dependence on renal dialysis: Secondary | ICD-10-CM | POA: Diagnosis not present

## 2019-03-19 DIAGNOSIS — N186 End stage renal disease: Secondary | ICD-10-CM | POA: Diagnosis not present

## 2019-03-21 NOTE — Anesthesia Postprocedure Evaluation (Signed)
Anesthesia Post Note  Patient: Christy Zhang [facility]  Procedure(s) Performed: INSERTION OF ARTERIOVENOUS (AV) GORE-TEX GRAFT RIGHT ARM (Right )     Patient location during evaluation: PACU Anesthesia Type: General Level of consciousness: awake and alert Pain management: pain level controlled Vital Signs Assessment: post-procedure vital signs reviewed and stable Respiratory status: spontaneous breathing, nonlabored ventilation, respiratory function stable and patient connected to nasal cannula oxygen Cardiovascular status: blood pressure returned to baseline and stable Postop Assessment: no apparent nausea or vomiting Anesthetic complications: no    Last Vitals:  Vitals:   03/18/19 1315 03/18/19 1325  BP: 138/64 (!) 133/58  Pulse: 72 72  Resp: 14 16  Temp: (!) 36.1 C   SpO2: 94% 94%    Last Pain:  Vitals:   03/18/19 1300  TempSrc:   PainSc: 3                  Caisley Baxendale

## 2019-03-22 DIAGNOSIS — N2581 Secondary hyperparathyroidism of renal origin: Secondary | ICD-10-CM | POA: Diagnosis not present

## 2019-03-22 DIAGNOSIS — N186 End stage renal disease: Secondary | ICD-10-CM | POA: Diagnosis not present

## 2019-03-22 DIAGNOSIS — Z992 Dependence on renal dialysis: Secondary | ICD-10-CM | POA: Diagnosis not present

## 2019-03-22 DIAGNOSIS — D509 Iron deficiency anemia, unspecified: Secondary | ICD-10-CM | POA: Diagnosis not present

## 2019-03-22 DIAGNOSIS — D631 Anemia in chronic kidney disease: Secondary | ICD-10-CM | POA: Diagnosis not present

## 2019-03-24 DIAGNOSIS — N2581 Secondary hyperparathyroidism of renal origin: Secondary | ICD-10-CM | POA: Diagnosis not present

## 2019-03-24 DIAGNOSIS — N186 End stage renal disease: Secondary | ICD-10-CM | POA: Diagnosis not present

## 2019-03-24 DIAGNOSIS — Z992 Dependence on renal dialysis: Secondary | ICD-10-CM | POA: Diagnosis not present

## 2019-03-24 DIAGNOSIS — D509 Iron deficiency anemia, unspecified: Secondary | ICD-10-CM | POA: Diagnosis not present

## 2019-03-24 DIAGNOSIS — D631 Anemia in chronic kidney disease: Secondary | ICD-10-CM | POA: Diagnosis not present

## 2019-03-26 DIAGNOSIS — N186 End stage renal disease: Secondary | ICD-10-CM | POA: Diagnosis not present

## 2019-03-26 DIAGNOSIS — D509 Iron deficiency anemia, unspecified: Secondary | ICD-10-CM | POA: Diagnosis not present

## 2019-03-26 DIAGNOSIS — N2581 Secondary hyperparathyroidism of renal origin: Secondary | ICD-10-CM | POA: Diagnosis not present

## 2019-03-26 DIAGNOSIS — D631 Anemia in chronic kidney disease: Secondary | ICD-10-CM | POA: Diagnosis not present

## 2019-03-26 DIAGNOSIS — Z992 Dependence on renal dialysis: Secondary | ICD-10-CM | POA: Diagnosis not present

## 2019-03-29 DIAGNOSIS — D631 Anemia in chronic kidney disease: Secondary | ICD-10-CM | POA: Diagnosis not present

## 2019-03-29 DIAGNOSIS — N186 End stage renal disease: Secondary | ICD-10-CM | POA: Diagnosis not present

## 2019-03-29 DIAGNOSIS — D509 Iron deficiency anemia, unspecified: Secondary | ICD-10-CM | POA: Diagnosis not present

## 2019-03-29 DIAGNOSIS — Z992 Dependence on renal dialysis: Secondary | ICD-10-CM | POA: Diagnosis not present

## 2019-03-29 DIAGNOSIS — N2581 Secondary hyperparathyroidism of renal origin: Secondary | ICD-10-CM | POA: Diagnosis not present

## 2019-03-31 DIAGNOSIS — D631 Anemia in chronic kidney disease: Secondary | ICD-10-CM | POA: Diagnosis not present

## 2019-03-31 DIAGNOSIS — Z992 Dependence on renal dialysis: Secondary | ICD-10-CM | POA: Diagnosis not present

## 2019-03-31 DIAGNOSIS — N2581 Secondary hyperparathyroidism of renal origin: Secondary | ICD-10-CM | POA: Diagnosis not present

## 2019-03-31 DIAGNOSIS — D509 Iron deficiency anemia, unspecified: Secondary | ICD-10-CM | POA: Diagnosis not present

## 2019-03-31 DIAGNOSIS — N186 End stage renal disease: Secondary | ICD-10-CM | POA: Diagnosis not present

## 2019-04-02 DIAGNOSIS — D631 Anemia in chronic kidney disease: Secondary | ICD-10-CM | POA: Diagnosis not present

## 2019-04-02 DIAGNOSIS — N186 End stage renal disease: Secondary | ICD-10-CM | POA: Diagnosis not present

## 2019-04-02 DIAGNOSIS — N2581 Secondary hyperparathyroidism of renal origin: Secondary | ICD-10-CM | POA: Diagnosis not present

## 2019-04-02 DIAGNOSIS — D509 Iron deficiency anemia, unspecified: Secondary | ICD-10-CM | POA: Diagnosis not present

## 2019-04-02 DIAGNOSIS — Z992 Dependence on renal dialysis: Secondary | ICD-10-CM | POA: Diagnosis not present

## 2019-04-05 DIAGNOSIS — N2581 Secondary hyperparathyroidism of renal origin: Secondary | ICD-10-CM | POA: Diagnosis not present

## 2019-04-05 DIAGNOSIS — D509 Iron deficiency anemia, unspecified: Secondary | ICD-10-CM | POA: Diagnosis not present

## 2019-04-05 DIAGNOSIS — D631 Anemia in chronic kidney disease: Secondary | ICD-10-CM | POA: Diagnosis not present

## 2019-04-05 DIAGNOSIS — N186 End stage renal disease: Secondary | ICD-10-CM | POA: Diagnosis not present

## 2019-04-05 DIAGNOSIS — Z992 Dependence on renal dialysis: Secondary | ICD-10-CM | POA: Diagnosis not present

## 2019-04-07 DIAGNOSIS — D631 Anemia in chronic kidney disease: Secondary | ICD-10-CM | POA: Diagnosis not present

## 2019-04-07 DIAGNOSIS — N186 End stage renal disease: Secondary | ICD-10-CM | POA: Diagnosis not present

## 2019-04-07 DIAGNOSIS — D509 Iron deficiency anemia, unspecified: Secondary | ICD-10-CM | POA: Diagnosis not present

## 2019-04-07 DIAGNOSIS — N2581 Secondary hyperparathyroidism of renal origin: Secondary | ICD-10-CM | POA: Diagnosis not present

## 2019-04-07 DIAGNOSIS — Z992 Dependence on renal dialysis: Secondary | ICD-10-CM | POA: Diagnosis not present

## 2019-04-09 ENCOUNTER — Ambulatory Visit (INDEPENDENT_AMBULATORY_CARE_PROVIDER_SITE_OTHER): Payer: Medicare Other | Admitting: Family

## 2019-04-09 ENCOUNTER — Other Ambulatory Visit: Payer: Self-pay

## 2019-04-09 ENCOUNTER — Telehealth: Payer: Self-pay | Admitting: *Deleted

## 2019-04-09 ENCOUNTER — Encounter: Payer: Self-pay | Admitting: Family

## 2019-04-09 VITALS — BP 214/76 | HR 69 | Temp 97.8°F | Resp 16 | Ht 63.0 in | Wt 250.0 lb

## 2019-04-09 DIAGNOSIS — T8131XA Disruption of external operation (surgical) wound, not elsewhere classified, initial encounter: Secondary | ICD-10-CM

## 2019-04-09 DIAGNOSIS — Z992 Dependence on renal dialysis: Secondary | ICD-10-CM

## 2019-04-09 DIAGNOSIS — N186 End stage renal disease: Secondary | ICD-10-CM

## 2019-04-09 DIAGNOSIS — Z95828 Presence of other vascular implants and grafts: Secondary | ICD-10-CM

## 2019-04-09 DIAGNOSIS — N2581 Secondary hyperparathyroidism of renal origin: Secondary | ICD-10-CM | POA: Diagnosis not present

## 2019-04-09 DIAGNOSIS — D509 Iron deficiency anemia, unspecified: Secondary | ICD-10-CM | POA: Diagnosis not present

## 2019-04-09 DIAGNOSIS — Z9889 Other specified postprocedural states: Secondary | ICD-10-CM

## 2019-04-09 DIAGNOSIS — D631 Anemia in chronic kidney disease: Secondary | ICD-10-CM | POA: Diagnosis not present

## 2019-04-09 MED ORDER — CEPHALEXIN 500 MG PO CAPS: 500.0000 mg | ORAL_CAPSULE | Freq: Two times a day (BID) | ORAL | 0 refills | Status: DC

## 2019-04-09 NOTE — Progress Notes (Signed)
CC: s/p AVG placement, dehiscence of incision   History of Present Illness  Christy Zhang is a 63 y.o. (08/15/1955) female who is s/p Right upper arm loop graft (6 mm) insertion on 03-18-19 by Dr. Trula Slade. The patient has previously undergone a basilic vein fistula creation which failed early.  At the time of her operation, her brachial artery was noted to be heavily calcified.   She returns today referred by her [facility] for evaluation of right axilla AVG incision that has dehisced, is shallow, no tunneling.  She and her grandson state that the wound appears improved.   She denies fever or chills., denies steal type symptoms in her right hand, denies pain at the incision.   She dialyzes on MWF via right IJ TDC.    Past Medical History:  Diagnosis Date  . Anemia   . Arthritis   . Asthma    in the past  . CHF (congestive heart failure) ([place])   . Chronic kidney disease    Renal failure, dialysis on M/W/F  . 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 End stage renal disease ([place])   . End stage renal disease on dialysis ([place])   . Hyperlipidemia   . Hypertension   . Neuromuscular disorder [facility])     Social History Social History   Tobacco Use  . Smoking status: Former Smoker    Types: Cigarettes    Quit date: 07/25/1984    Years since quitting: 34.7  . Smokeless tobacco: Never Used  Substance Use Topics  . Alcohol use: No    Alcohol/week: 0.0 standard drinks  . Drug use: No    Family History Family History  Problem Relation Age of Onset  . Diabetes Mother   . Hypertension Mother   . Cancer Father   . Diabetes Father   . Hypertension Sister   . Diabetes Daughter   . Heart disease Daughter   . Hypertension Daughter   . Diabetes Son   . Heart disease Son   . Hypertension Son   . Peripheral vascular disease Son        amputation    Surgical History Past Surgical History:  Procedure Laterality Date  . A/V FISTULAGRAM Right 02/04/2017   Procedure: A/V Fistulagram;  Surgeon: Waynetta Sandy, MD;  Location: [place] [facility];  Service: Cardiovascular;  Laterality: Right;  . AV FISTULA PLACEMENT Right 08/25/2014   Procedure: RIGHT BRACHIOCEPHALIC ARTERIOVENOUS (AV) FISTULA CREATION;  Surgeon: Mal Misty, MD;  Location: [facility] OR;  Service: Vascular;  Laterality: Right;  . AV FISTULA PLACEMENT Right 03/18/2019   Procedure: INSERTION OF ARTERIOVENOUS (AV) GORE-TEX GRAFT RIGHT ARM;  Surgeon: Serafina Mitchell, MD;  Location: [place];  Service: Vascular;  Laterality: Right;  . BASCILIC VEIN TRANSPOSITION Right 12/24/2018   Procedure: FIRST STAGE BASILIC VEIN TRANSPOSITION RIGHT UPPER ARM;  Surgeon: Serafina Mitchell, MD;  Location: [place];  Service: Vascular;  Laterality: Right;  . FISTULA SUPERFICIALIZATION Right 11/03/2014   Procedure: RIGHT UPPER ARM FISTULA SUPERFICIALIZATION;  Surgeon: Mal Misty, MD;  Location: [place];  Service: Vascular;  Laterality: Right;  . FISTULOGRAM Right 01/16/2016   Procedure: RIGHT UPPER EXTREMITY ARTERIOVENOUS FISTULOGRAM WITH BALLOON ANGIOPLASTY;  Surgeon: Vickie Epley, MD;  Location: [facility];  Service: Vascular;  Laterality: Right;  . INSERTION OF DIALYSIS CATHETER     X4  . IR FLUORO GUIDE CV LINE RIGHT  12/03/2018  . IR THROMBECTOMY AV  FISTULA W/THROMBOLYSIS/PTA INC/SHUNT/IMG RIGHT Right 12/03/2018  . IR US GUIDE VASC ACCESS RIGHT  12/03/2018  . IR US GUIDE VASC ACCESS RIGHT  12/03/2018  . LIGATION OF COMPETING BRANCHES OF ARTERIOVENOUS FISTULA Right 11/03/2014   Procedure: LIGATION OF COMPETING BRANCHE x1 OF RIGHT UPPER ARM ARTERIOVENOUS FISTULA;  Surgeon: Mal Misty, MD;  Location: [place];  Service: Vascular;  Laterality: Right;  . PERIPHERAL VASCULAR BALLOON ANGIOPLASTY Right 02/04/2017   Procedure: PERIPHERAL VASCULAR BALLOON ANGIOPLASTY;  Surgeon: Waynetta Sandy, MD;  Location: [place] [facility];  Service: Cardiovascular;  Laterality: Right;  . TUBAL LIGATION      No Known  Allergies  Current Outpatient Medications  Medication Sig Dispense Refill  . acetaminophen (TYLENOL) 325 MG tablet Take 650 mg by mouth every 6 (six) hours as needed for moderate pain or headache.    Marland Kitchen amLODipine (NORVASC) 5 MG tablet Take 5 mg by mouth at bedtime.     Marland Kitchen atorvastatin (LIPITOR) 40 MG tablet Take 1 tablet (40 mg total) by mouth daily. 90 tablet 3  . blood glucose meter kit and supplies KIT Dispense based on patient and insurance preference. Use up to four times daily as directed. (FOR ICD-9 250.00, 250.01). 1 each 0  . Blood Glucose Monitoring Suppl (BLOOD GLUCOSE MONITOR SYSTEM) w/Device KIT Use to check Blood Sugar up to 4 x daily as directed. Dispense based on pt insurance. E11.9 1 each 0  . diphenhydrAMINE (BENADRYL) 25 mg capsule Take 25 mg by mouth every 8 (eight) hours as needed for allergies or sleep.     . Dulaglutide (TRULICITY) 1.5 WH/6.7RF SOPN Inject content of one pen under the skin weekly on Saturday (Patient taking differently: Inject 1.5 mg as directed every Saturday. ) 4 pen 12  . fluocinonide-emollient (LIDEX-E) 0.05 % cream Apply 1 application topically 2 (two) times daily. To affected areas (Patient taking differently: Apply 1 application topically 2 (two) times daily as needed (skin spots). To affected areas) 60 g 5  . glucose blood test strip Use to test blood sugars QID 100 each 12  . Insulin Glargine, 1 Unit Dial, (TOUJEO SOLOSTAR) 300 UNIT/ML SOPN Inject 100 Units into the skin daily. 10 mL 2  . Lancets Misc. KIT 1 each by Does not apply route 4 (four) times daily as needed. 120 each 11  . metoprolol succinate (TOPROL-XL) 100 MG 24 hr tablet Take 1 tablet (100 mg total) by mouth daily. Take with or immediately following a meal. (Patient taking differently: Take 100 mg by mouth at bedtime. Take with or immediately following a meal. ) 90 tablet 1  . multivitamin (RENA-VIT) TABS tablet Take 1 tablet by mouth daily.    . sevelamer carbonate (RENVELA) 800 MG  tablet Take 1,600-2,400 mg by mouth See admin instructions. Take 2400 mg with each meal and 1600 mg with each snack    . oxyCODONE-acetaminophen (PERCOCET) 5-325 MG tablet Take 1 tablet by mouth every 6 (six) hours as needed. (Patient not taking: Reported on 04/09/2019) 20 tablet 0   No current facility-administered medications for this visit.     REVIEW OF SYSTEMS: see HPI for pertinent positives and negatives    PHYSICAL EXAMINATION:  Vitals:   04/09/19 1514 04/09/19 1520  BP: (!) 213/77 (!) 214/76  Pulse: 69 69  Resp: 16   Temp: 97.8 F (36.6 C)   TempSrc: Temporal   SpO2: 98%   Weight: 250 lb (113.4 kg)   Height: _0  (1.6 m)  Body mass index is 44.29 kg/m.  General: Morbidly obese female in NAD, accompanied by her grandson  HEENT:  No gross abnormalities Pulmonary: Respirations are non-labored Abdomen: Soft and non-tender with normal bowel sounds Musculoskeletal: There are no major deformities.   Neurologic: No focal weakness or paresthesias are detected Skin: There are no ulcer or rashes noted. See photo below Psychiatric: The patient has normal affect. Cardiovascular: There is a regular rate and rhythm without significant murmur appreciated.  Right radial pulse is 1+ palpable Right IJ TDC noted   Right axilla, dehisced incision, no tunneling, no erythema, no fluctuance (probed with a sterile cotton swab)    Medical Decision Making  Gwynne Kemnitz is a 63 y.o. female who is s/p right upper arm loop graft (6 mm) insertion on 03-18-19 by Dr. Trula Slade. The patient has previously undergone a basilic vein fistula creation which failed early.  At the time of her operation, her brachial artery was noted to be heavily calcified.   She was referred by her [employer] for evaluation of right axilla incision dehiscence. She has no fever or chills, there are no signs of infection at the open incision.   Keflex 500 mg po bid x 10 days, disp #20, 0 refills. Continue  cleansing opening of incision in right axilla bid, use liquid Dial soap, apply antibiotic ointment, then gauze dressing.  Keep wound clean and dry. Pt advised to notify [facility] if she develops fever or chills, or if the wound appears worse  Follow up next week for wound check with [facility] surgeon.    Clemon Chambers, RN, MSN, FNP-C [facility] [place] Office: [phone]  04/09/2019, 3:46 PM  Clinic MD: Donzetta Matters on call

## 2019-04-09 NOTE — Telephone Encounter (Signed)
Call from patient's daughter. They were instructed to call this office today due to 1 week Hx. Of small amount of drainage right arm S/p A/V graft 11-19.  Denies any fever or chills and claims drainage is near to stop, but was instructed to see [facility] today or go to ER. Agreeable to see NP this afternoon appt. given

## 2019-04-12 DIAGNOSIS — D631 Anemia in chronic kidney disease: Secondary | ICD-10-CM | POA: Diagnosis not present

## 2019-04-12 DIAGNOSIS — Z992 Dependence on renal dialysis: Secondary | ICD-10-CM | POA: Diagnosis not present

## 2019-04-12 DIAGNOSIS — N186 End stage renal disease: Secondary | ICD-10-CM | POA: Diagnosis not present

## 2019-04-12 DIAGNOSIS — N2581 Secondary hyperparathyroidism of renal origin: Secondary | ICD-10-CM | POA: Diagnosis not present

## 2019-04-12 DIAGNOSIS — D509 Iron deficiency anemia, unspecified: Secondary | ICD-10-CM | POA: Diagnosis not present

## 2019-04-14 DIAGNOSIS — N186 End stage renal disease: Secondary | ICD-10-CM | POA: Diagnosis not present

## 2019-04-14 DIAGNOSIS — D509 Iron deficiency anemia, unspecified: Secondary | ICD-10-CM | POA: Diagnosis not present

## 2019-04-14 DIAGNOSIS — Z992 Dependence on renal dialysis: Secondary | ICD-10-CM | POA: Diagnosis not present

## 2019-04-14 DIAGNOSIS — N2581 Secondary hyperparathyroidism of renal origin: Secondary | ICD-10-CM | POA: Diagnosis not present

## 2019-04-14 DIAGNOSIS — D631 Anemia in chronic kidney disease: Secondary | ICD-10-CM | POA: Diagnosis not present

## 2019-04-16 DIAGNOSIS — N2581 Secondary hyperparathyroidism of renal origin: Secondary | ICD-10-CM | POA: Diagnosis not present

## 2019-04-16 DIAGNOSIS — D631 Anemia in chronic kidney disease: Secondary | ICD-10-CM | POA: Diagnosis not present

## 2019-04-16 DIAGNOSIS — N186 End stage renal disease: Secondary | ICD-10-CM | POA: Diagnosis not present

## 2019-04-16 DIAGNOSIS — Z992 Dependence on renal dialysis: Secondary | ICD-10-CM | POA: Diagnosis not present

## 2019-04-16 DIAGNOSIS — D509 Iron deficiency anemia, unspecified: Secondary | ICD-10-CM | POA: Diagnosis not present

## 2019-04-19 ENCOUNTER — Ambulatory Visit (INDEPENDENT_AMBULATORY_CARE_PROVIDER_SITE_OTHER): Payer: Self-pay | Admitting: Physician Assistant

## 2019-04-19 ENCOUNTER — Other Ambulatory Visit: Payer: Self-pay

## 2019-04-19 VITALS — BP 191/82 | HR 72 | Temp 97.7°F | Resp 20 | Ht 63.0 in | Wt 250.0 lb

## 2019-04-19 DIAGNOSIS — D509 Iron deficiency anemia, unspecified: Secondary | ICD-10-CM | POA: Diagnosis not present

## 2019-04-19 DIAGNOSIS — Z992 Dependence on renal dialysis: Secondary | ICD-10-CM

## 2019-04-19 DIAGNOSIS — N186 End stage renal disease: Secondary | ICD-10-CM

## 2019-04-19 DIAGNOSIS — D631 Anemia in chronic kidney disease: Secondary | ICD-10-CM | POA: Diagnosis not present

## 2019-04-19 DIAGNOSIS — N2581 Secondary hyperparathyroidism of renal origin: Secondary | ICD-10-CM | POA: Diagnosis not present

## 2019-04-19 NOTE — Progress Notes (Signed)
POST OPERATIVE OFFICE NOTE    CC:  F/u for incision dehiscence  HPI:  This is a 63 y.o. female who is s/p right upper arm AV loop graft 4 weeks ago (Dr. Trula Slade).  She was seen postop for incisional dehiscence.  She took Keflex as directed. She denies fever, chills or drainage from the wound.  She is currently dialyzing via right TDC.  Allergies  Allergen Reactions  . Sulfa Antibiotics Swelling    Current Outpatient Medications  Medication Sig Dispense Refill  . acetaminophen (TYLENOL) 325 MG tablet Take 650 mg by mouth every 6 (six) hours as needed for moderate pain or headache.    Marland Kitchen amLODipine (NORVASC) 5 MG tablet Take 5 mg by mouth at bedtime.     Marland Kitchen atorvastatin (LIPITOR) 40 MG tablet Take 1 tablet (40 mg total) by mouth daily. 90 tablet 3  . blood glucose meter kit and supplies KIT Dispense based on patient and insurance preference. Use up to four times daily as directed. (FOR ICD-9 250.00, 250.01). 1 each 0  . Blood Glucose Monitoring Suppl (BLOOD GLUCOSE MONITOR SYSTEM) w/Device KIT Use to check Blood Sugar up to 4 x daily as directed. Dispense based on pt insurance. E11.9 1 each 0  . cephALEXin (KEFLEX) 500 MG capsule Take 1 capsule (500 mg total) by mouth 2 (two) times daily. 20 capsule 0  . diphenhydrAMINE (BENADRYL) 25 mg capsule Take 25 mg by mouth every 8 (eight) hours as needed for allergies or sleep.     . Dulaglutide (TRULICITY) 1.5 BO/1.7PZ SOPN Inject content of one pen under the skin weekly on Saturday (Patient taking differently: Inject 1.5 mg as directed every Saturday. ) 4 pen 12  . fluocinonide-emollient (LIDEX-E) 0.05 % cream Apply 1 application topically 2 (two) times daily. To affected areas (Patient taking differently: Apply 1 application topically 2 (two) times daily as needed (skin spots). To affected areas) 60 g 5  . glucose blood test strip Use to test blood sugars QID 100 each 12  . Insulin Glargine, 1 Unit Dial, (TOUJEO SOLOSTAR) 300 UNIT/ML SOPN Inject 100  Units into the skin daily. 10 mL 2  . Lancets Misc. KIT 1 each by Does not apply route 4 (four) times daily as needed. 120 each 11  . metoprolol succinate (TOPROL-XL) 100 MG 24 hr tablet Take 1 tablet (100 mg total) by mouth daily. Take with or immediately following a meal. (Patient taking differently: Take 100 mg by mouth at bedtime. Take with or immediately following a meal. ) 90 tablet 1  . multivitamin (RENA-VIT) TABS tablet Take 1 tablet by mouth daily.    Marland Kitchen oxyCODONE-acetaminophen (PERCOCET) 5-325 MG tablet Take 1 tablet by mouth every 6 (six) hours as needed. 20 tablet 0  . sevelamer carbonate (RENVELA) 800 MG tablet Take 1,600-2,400 mg by mouth See admin instructions. Take 2400 mg with each meal and 1600 mg with each snack     No current facility-administered medications for this visit.     ROS:  See HPI  Physical Exam:    Incision:  approx 2x4 cm area if skin edge separation. No peri-wound erythema or drainage. No tenderness to palpation.  Good granulation with a small area of fibrinous exudate. Extremities:  Good bruit in graft. Radial and ulnar arteries non-palpable. + Doppler signals Neuro: A and O x 4  Assessment/Plan:  This is a 63 y.o. female who is s/p: right upper arm AV graft with wound dehiscence. No infection/cellulitis. Continue local wound care.  Emphasized daily soap and water, clean dry dressing. Continue HD via catheter. Recheck in 4 weeks for resolution.  We will wait until the wound is completely heald before using the graft to prevent possible graft infection. -   Risa Grill, PA-C [facility] [phone]  Clinic MD: Trula Slade

## 2019-04-21 DIAGNOSIS — N186 End stage renal disease: Secondary | ICD-10-CM | POA: Diagnosis not present

## 2019-04-21 DIAGNOSIS — D631 Anemia in chronic kidney disease: Secondary | ICD-10-CM | POA: Diagnosis not present

## 2019-04-21 DIAGNOSIS — D509 Iron deficiency anemia, unspecified: Secondary | ICD-10-CM | POA: Diagnosis not present

## 2019-04-21 DIAGNOSIS — Z992 Dependence on renal dialysis: Secondary | ICD-10-CM | POA: Diagnosis not present

## 2019-04-21 DIAGNOSIS — N2581 Secondary hyperparathyroidism of renal origin: Secondary | ICD-10-CM | POA: Diagnosis not present

## 2019-04-24 DIAGNOSIS — Z992 Dependence on renal dialysis: Secondary | ICD-10-CM | POA: Diagnosis not present

## 2019-04-24 DIAGNOSIS — N186 End stage renal disease: Secondary | ICD-10-CM | POA: Diagnosis not present

## 2019-04-24 DIAGNOSIS — D631 Anemia in chronic kidney disease: Secondary | ICD-10-CM | POA: Diagnosis not present

## 2019-04-24 DIAGNOSIS — N2581 Secondary hyperparathyroidism of renal origin: Secondary | ICD-10-CM | POA: Diagnosis not present

## 2019-04-24 DIAGNOSIS — D509 Iron deficiency anemia, unspecified: Secondary | ICD-10-CM | POA: Diagnosis not present

## 2019-04-26 DIAGNOSIS — N2581 Secondary hyperparathyroidism of renal origin: Secondary | ICD-10-CM | POA: Diagnosis not present

## 2019-04-26 DIAGNOSIS — D509 Iron deficiency anemia, unspecified: Secondary | ICD-10-CM | POA: Diagnosis not present

## 2019-04-26 DIAGNOSIS — D631 Anemia in chronic kidney disease: Secondary | ICD-10-CM | POA: Diagnosis not present

## 2019-04-26 DIAGNOSIS — Z992 Dependence on renal dialysis: Secondary | ICD-10-CM | POA: Diagnosis not present

## 2019-04-26 DIAGNOSIS — N186 End stage renal disease: Secondary | ICD-10-CM | POA: Diagnosis not present

## 2019-04-28 DIAGNOSIS — N2581 Secondary hyperparathyroidism of renal origin: Secondary | ICD-10-CM | POA: Diagnosis not present

## 2019-04-28 DIAGNOSIS — D631 Anemia in chronic kidney disease: Secondary | ICD-10-CM | POA: Diagnosis not present

## 2019-04-28 DIAGNOSIS — D509 Iron deficiency anemia, unspecified: Secondary | ICD-10-CM | POA: Diagnosis not present

## 2019-04-28 DIAGNOSIS — N186 End stage renal disease: Secondary | ICD-10-CM | POA: Diagnosis not present

## 2019-04-28 DIAGNOSIS — Z992 Dependence on renal dialysis: Secondary | ICD-10-CM | POA: Diagnosis not present

## 2019-04-30 DIAGNOSIS — D509 Iron deficiency anemia, unspecified: Secondary | ICD-10-CM | POA: Diagnosis not present

## 2019-04-30 DIAGNOSIS — Z992 Dependence on renal dialysis: Secondary | ICD-10-CM | POA: Diagnosis not present

## 2019-04-30 DIAGNOSIS — N2581 Secondary hyperparathyroidism of renal origin: Secondary | ICD-10-CM | POA: Diagnosis not present

## 2019-04-30 DIAGNOSIS — N186 End stage renal disease: Secondary | ICD-10-CM | POA: Diagnosis not present

## 2019-04-30 DIAGNOSIS — D631 Anemia in chronic kidney disease: Secondary | ICD-10-CM | POA: Diagnosis not present

## 2019-05-03 DIAGNOSIS — N186 End stage renal disease: Secondary | ICD-10-CM | POA: Diagnosis not present

## 2019-05-03 DIAGNOSIS — D509 Iron deficiency anemia, unspecified: Secondary | ICD-10-CM | POA: Diagnosis not present

## 2019-05-03 DIAGNOSIS — E785 Hyperlipidemia, unspecified: Secondary | ICD-10-CM | POA: Diagnosis not present

## 2019-05-03 DIAGNOSIS — Z992 Dependence on renal dialysis: Secondary | ICD-10-CM | POA: Diagnosis not present

## 2019-05-03 DIAGNOSIS — N2581 Secondary hyperparathyroidism of renal origin: Secondary | ICD-10-CM | POA: Diagnosis not present

## 2019-05-03 DIAGNOSIS — D631 Anemia in chronic kidney disease: Secondary | ICD-10-CM | POA: Diagnosis not present

## 2019-05-05 DIAGNOSIS — Z992 Dependence on renal dialysis: Secondary | ICD-10-CM | POA: Diagnosis not present

## 2019-05-05 DIAGNOSIS — N186 End stage renal disease: Secondary | ICD-10-CM | POA: Diagnosis not present

## 2019-05-05 DIAGNOSIS — N2581 Secondary hyperparathyroidism of renal origin: Secondary | ICD-10-CM | POA: Diagnosis not present

## 2019-05-05 DIAGNOSIS — D509 Iron deficiency anemia, unspecified: Secondary | ICD-10-CM | POA: Diagnosis not present

## 2019-05-05 DIAGNOSIS — D631 Anemia in chronic kidney disease: Secondary | ICD-10-CM | POA: Diagnosis not present

## 2019-05-07 DIAGNOSIS — Z992 Dependence on renal dialysis: Secondary | ICD-10-CM | POA: Diagnosis not present

## 2019-05-07 DIAGNOSIS — D631 Anemia in chronic kidney disease: Secondary | ICD-10-CM | POA: Diagnosis not present

## 2019-05-07 DIAGNOSIS — N2581 Secondary hyperparathyroidism of renal origin: Secondary | ICD-10-CM | POA: Diagnosis not present

## 2019-05-07 DIAGNOSIS — N186 End stage renal disease: Secondary | ICD-10-CM | POA: Diagnosis not present

## 2019-05-07 DIAGNOSIS — D509 Iron deficiency anemia, unspecified: Secondary | ICD-10-CM | POA: Diagnosis not present

## 2019-05-10 DIAGNOSIS — D631 Anemia in chronic kidney disease: Secondary | ICD-10-CM | POA: Diagnosis not present

## 2019-05-10 DIAGNOSIS — Z992 Dependence on renal dialysis: Secondary | ICD-10-CM | POA: Diagnosis not present

## 2019-05-10 DIAGNOSIS — N2581 Secondary hyperparathyroidism of renal origin: Secondary | ICD-10-CM | POA: Diagnosis not present

## 2019-05-10 DIAGNOSIS — N186 End stage renal disease: Secondary | ICD-10-CM | POA: Diagnosis not present

## 2019-05-10 DIAGNOSIS — D509 Iron deficiency anemia, unspecified: Secondary | ICD-10-CM | POA: Diagnosis not present

## 2019-05-12 DIAGNOSIS — N2581 Secondary hyperparathyroidism of renal origin: Secondary | ICD-10-CM | POA: Diagnosis not present

## 2019-05-12 DIAGNOSIS — D631 Anemia in chronic kidney disease: Secondary | ICD-10-CM | POA: Diagnosis not present

## 2019-05-12 DIAGNOSIS — N186 End stage renal disease: Secondary | ICD-10-CM | POA: Diagnosis not present

## 2019-05-12 DIAGNOSIS — D509 Iron deficiency anemia, unspecified: Secondary | ICD-10-CM | POA: Diagnosis not present

## 2019-05-12 DIAGNOSIS — Z992 Dependence on renal dialysis: Secondary | ICD-10-CM | POA: Diagnosis not present

## 2019-05-14 DIAGNOSIS — Z992 Dependence on renal dialysis: Secondary | ICD-10-CM | POA: Diagnosis not present

## 2019-05-14 DIAGNOSIS — D509 Iron deficiency anemia, unspecified: Secondary | ICD-10-CM | POA: Diagnosis not present

## 2019-05-14 DIAGNOSIS — D631 Anemia in chronic kidney disease: Secondary | ICD-10-CM | POA: Diagnosis not present

## 2019-05-14 DIAGNOSIS — N186 End stage renal disease: Secondary | ICD-10-CM | POA: Diagnosis not present

## 2019-05-14 DIAGNOSIS — N2581 Secondary hyperparathyroidism of renal origin: Secondary | ICD-10-CM | POA: Diagnosis not present

## 2019-05-17 DIAGNOSIS — N2581 Secondary hyperparathyroidism of renal origin: Secondary | ICD-10-CM | POA: Diagnosis not present

## 2019-05-17 DIAGNOSIS — Z992 Dependence on renal dialysis: Secondary | ICD-10-CM | POA: Diagnosis not present

## 2019-05-17 DIAGNOSIS — D509 Iron deficiency anemia, unspecified: Secondary | ICD-10-CM | POA: Diagnosis not present

## 2019-05-17 DIAGNOSIS — D631 Anemia in chronic kidney disease: Secondary | ICD-10-CM | POA: Diagnosis not present

## 2019-05-17 DIAGNOSIS — N186 End stage renal disease: Secondary | ICD-10-CM | POA: Diagnosis not present

## 2019-05-19 ENCOUNTER — Telehealth (HOSPITAL_COMMUNITY): Payer: Self-pay

## 2019-05-19 ENCOUNTER — Ambulatory Visit: Payer: Medicare Other | Admitting: Family Medicine

## 2019-05-19 DIAGNOSIS — N2581 Secondary hyperparathyroidism of renal origin: Secondary | ICD-10-CM | POA: Diagnosis not present

## 2019-05-19 DIAGNOSIS — Z992 Dependence on renal dialysis: Secondary | ICD-10-CM | POA: Diagnosis not present

## 2019-05-19 DIAGNOSIS — D631 Anemia in chronic kidney disease: Secondary | ICD-10-CM | POA: Diagnosis not present

## 2019-05-19 DIAGNOSIS — N186 End stage renal disease: Secondary | ICD-10-CM | POA: Diagnosis not present

## 2019-05-19 DIAGNOSIS — D509 Iron deficiency anemia, unspecified: Secondary | ICD-10-CM | POA: Diagnosis not present

## 2019-05-19 NOTE — Telephone Encounter (Signed)

## 2019-05-19 NOTE — Progress Notes (Signed)
POST OPERATIVE OFFICE NOTE    CC:  F/u for surgery  HPI:  This is a 64 y.o. female who is s/p  right upper arm AV loop graft 4 weeks ago (Dr. Trula Slade) 03/18/19.  She was seen postop for incisional dehiscence.  She took Keflex as directed.  She is currently dialyzing via right TDC.  She is here today for wound check.  She denies fever, chills or drainage from the wound.   Allergies  Allergen Reactions  . Sulfa Antibiotics Swelling    Current Outpatient Medications  Medication Sig Dispense Refill  . acetaminophen (TYLENOL) 325 MG tablet Take 650 mg by mouth every 6 (six) hours as needed for moderate pain or headache.    Marland Kitchen amLODipine (NORVASC) 5 MG tablet Take 5 mg by mouth at bedtime.     Marland Kitchen atorvastatin (LIPITOR) 40 MG tablet Take 1 tablet (40 mg total) by mouth daily. 90 tablet 3  . blood glucose meter kit and supplies KIT Dispense based on patient and insurance preference. Use up to four times daily as directed. (FOR ICD-9 250.00, 250.01). 1 each 0  . Blood Glucose Monitoring Suppl (BLOOD GLUCOSE MONITOR SYSTEM) w/Device KIT Use to check Blood Sugar up to 4 x daily as directed. Dispense based on pt insurance. E11.9 1 each 0  . cephALEXin (KEFLEX) 500 MG capsule Take 1 capsule (500 mg total) by mouth 2 (two) times daily. 20 capsule 0  . diphenhydrAMINE (BENADRYL) 25 mg capsule Take 25 mg by mouth every 8 (eight) hours as needed for allergies or sleep.     . Dulaglutide (TRULICITY) 1.5 KG/4.0NU SOPN Inject content of one pen under the skin weekly on Saturday (Patient taking differently: Inject 1.5 mg as directed every Saturday. ) 4 pen 12  . fluocinonide-emollient (LIDEX-E) 0.05 % cream Apply 1 application topically 2 (two) times daily. To affected areas (Patient taking differently: Apply 1 application topically 2 (two) times daily as needed (skin spots). To affected areas) 60 g 5  . glucose blood test strip Use to test blood sugars QID 100 each 12  . Insulin Glargine, 1 Unit Dial, (TOUJEO  SOLOSTAR) 300 UNIT/ML SOPN Inject 100 Units into the skin daily. 10 mL 2  . Lancets Misc. KIT 1 each by Does not apply route 4 (four) times daily as needed. 120 each 11  . metoprolol succinate (TOPROL-XL) 100 MG 24 hr tablet Take 1 tablet (100 mg total) by mouth daily. Take with or immediately following a meal. (Patient taking differently: Take 100 mg by mouth at bedtime. Take with or immediately following a meal. ) 90 tablet 1  . multivitamin (RENA-VIT) TABS tablet Take 1 tablet by mouth daily.    Marland Kitchen oxyCODONE-acetaminophen (PERCOCET) 5-325 MG tablet Take 1 tablet by mouth every 6 (six) hours as needed. 20 tablet 0  . sevelamer carbonate (RENVELA) 800 MG tablet Take 1,600-2,400 mg by mouth See admin instructions. Take 2400 mg with each meal and 1600 mg with each snack     No current facility-administered medications for this visit.     ROS:  See HPI  Physical Exam:    Incision:  incisions are healed except the volar proximal incision.  There is still superficial healing.  No erythema or drainage. Extremities:  Sensation to touch intact and grip 5/5 right UE Doppler thrill throughout loop graft.   Assessment/Plan:  This is a 64 y.o. female who is s/p:Right upper arm loop graft (6 mm)  The graft is looped from axillary artery  and vein so it may be stuck on dorsum and volar loop areas for HD.  The graft may be accessed for HD.  She will be scheduled for IR to remove her [facility] once the graft is functioning well at HD.  F/U PRN   Roxy Horseman , PA-C [facility] [phone]  Clinic MD:  Oneida Alar

## 2019-05-20 ENCOUNTER — Other Ambulatory Visit: Payer: Self-pay

## 2019-05-20 ENCOUNTER — Ambulatory Visit (INDEPENDENT_AMBULATORY_CARE_PROVIDER_SITE_OTHER): Payer: Self-pay | Admitting: Physician Assistant

## 2019-05-20 VITALS — BP 155/73 | HR 75 | Temp 98.1°F | Resp 20 | Ht 63.0 in | Wt 250.0 lb

## 2019-05-20 DIAGNOSIS — Z992 Dependence on renal dialysis: Secondary | ICD-10-CM

## 2019-05-20 DIAGNOSIS — N186 End stage renal disease: Secondary | ICD-10-CM

## 2019-05-21 DIAGNOSIS — D631 Anemia in chronic kidney disease: Secondary | ICD-10-CM | POA: Diagnosis not present

## 2019-05-21 DIAGNOSIS — Z992 Dependence on renal dialysis: Secondary | ICD-10-CM | POA: Diagnosis not present

## 2019-05-21 DIAGNOSIS — D509 Iron deficiency anemia, unspecified: Secondary | ICD-10-CM | POA: Diagnosis not present

## 2019-05-21 DIAGNOSIS — N2581 Secondary hyperparathyroidism of renal origin: Secondary | ICD-10-CM | POA: Diagnosis not present

## 2019-05-21 DIAGNOSIS — N186 End stage renal disease: Secondary | ICD-10-CM | POA: Diagnosis not present

## 2019-05-24 DIAGNOSIS — N2581 Secondary hyperparathyroidism of renal origin: Secondary | ICD-10-CM | POA: Diagnosis not present

## 2019-05-24 DIAGNOSIS — D631 Anemia in chronic kidney disease: Secondary | ICD-10-CM | POA: Diagnosis not present

## 2019-05-24 DIAGNOSIS — N186 End stage renal disease: Secondary | ICD-10-CM | POA: Diagnosis not present

## 2019-05-24 DIAGNOSIS — Z992 Dependence on renal dialysis: Secondary | ICD-10-CM | POA: Diagnosis not present

## 2019-05-24 DIAGNOSIS — D509 Iron deficiency anemia, unspecified: Secondary | ICD-10-CM | POA: Diagnosis not present

## 2019-05-26 DIAGNOSIS — Z992 Dependence on renal dialysis: Secondary | ICD-10-CM | POA: Diagnosis not present

## 2019-05-26 DIAGNOSIS — D509 Iron deficiency anemia, unspecified: Secondary | ICD-10-CM | POA: Diagnosis not present

## 2019-05-26 DIAGNOSIS — D631 Anemia in chronic kidney disease: Secondary | ICD-10-CM | POA: Diagnosis not present

## 2019-05-26 DIAGNOSIS — N186 End stage renal disease: Secondary | ICD-10-CM | POA: Diagnosis not present

## 2019-05-26 DIAGNOSIS — N2581 Secondary hyperparathyroidism of renal origin: Secondary | ICD-10-CM | POA: Diagnosis not present

## 2019-05-28 DIAGNOSIS — N186 End stage renal disease: Secondary | ICD-10-CM | POA: Diagnosis not present

## 2019-05-28 DIAGNOSIS — N2581 Secondary hyperparathyroidism of renal origin: Secondary | ICD-10-CM | POA: Diagnosis not present

## 2019-05-28 DIAGNOSIS — D509 Iron deficiency anemia, unspecified: Secondary | ICD-10-CM | POA: Diagnosis not present

## 2019-05-28 DIAGNOSIS — D631 Anemia in chronic kidney disease: Secondary | ICD-10-CM | POA: Diagnosis not present

## 2019-05-28 DIAGNOSIS — Z992 Dependence on renal dialysis: Secondary | ICD-10-CM | POA: Diagnosis not present

## 2019-05-30 DIAGNOSIS — Z992 Dependence on renal dialysis: Secondary | ICD-10-CM | POA: Diagnosis not present

## 2019-05-30 DIAGNOSIS — N186 End stage renal disease: Secondary | ICD-10-CM | POA: Diagnosis not present

## 2019-05-31 DIAGNOSIS — D631 Anemia in chronic kidney disease: Secondary | ICD-10-CM | POA: Diagnosis not present

## 2019-05-31 DIAGNOSIS — Z992 Dependence on renal dialysis: Secondary | ICD-10-CM | POA: Diagnosis not present

## 2019-05-31 DIAGNOSIS — N186 End stage renal disease: Secondary | ICD-10-CM | POA: Diagnosis not present

## 2019-05-31 DIAGNOSIS — Z23 Encounter for immunization: Secondary | ICD-10-CM | POA: Diagnosis not present

## 2019-05-31 DIAGNOSIS — N2581 Secondary hyperparathyroidism of renal origin: Secondary | ICD-10-CM | POA: Diagnosis not present

## 2019-05-31 DIAGNOSIS — D509 Iron deficiency anemia, unspecified: Secondary | ICD-10-CM | POA: Diagnosis not present

## 2019-06-02 DIAGNOSIS — Z23 Encounter for immunization: Secondary | ICD-10-CM | POA: Diagnosis not present

## 2019-06-02 DIAGNOSIS — D631 Anemia in chronic kidney disease: Secondary | ICD-10-CM | POA: Diagnosis not present

## 2019-06-02 DIAGNOSIS — Z992 Dependence on renal dialysis: Secondary | ICD-10-CM | POA: Diagnosis not present

## 2019-06-02 DIAGNOSIS — N2581 Secondary hyperparathyroidism of renal origin: Secondary | ICD-10-CM | POA: Diagnosis not present

## 2019-06-02 DIAGNOSIS — N186 End stage renal disease: Secondary | ICD-10-CM | POA: Diagnosis not present

## 2019-06-02 DIAGNOSIS — D509 Iron deficiency anemia, unspecified: Secondary | ICD-10-CM | POA: Diagnosis not present

## 2019-06-04 DIAGNOSIS — Z992 Dependence on renal dialysis: Secondary | ICD-10-CM | POA: Diagnosis not present

## 2019-06-04 DIAGNOSIS — Z23 Encounter for immunization: Secondary | ICD-10-CM | POA: Diagnosis not present

## 2019-06-04 DIAGNOSIS — N186 End stage renal disease: Secondary | ICD-10-CM | POA: Diagnosis not present

## 2019-06-04 DIAGNOSIS — N2581 Secondary hyperparathyroidism of renal origin: Secondary | ICD-10-CM | POA: Diagnosis not present

## 2019-06-04 DIAGNOSIS — D631 Anemia in chronic kidney disease: Secondary | ICD-10-CM | POA: Diagnosis not present

## 2019-06-04 DIAGNOSIS — D509 Iron deficiency anemia, unspecified: Secondary | ICD-10-CM | POA: Diagnosis not present

## 2019-06-07 DIAGNOSIS — N2581 Secondary hyperparathyroidism of renal origin: Secondary | ICD-10-CM | POA: Diagnosis not present

## 2019-06-07 DIAGNOSIS — Z23 Encounter for immunization: Secondary | ICD-10-CM | POA: Diagnosis not present

## 2019-06-07 DIAGNOSIS — Z992 Dependence on renal dialysis: Secondary | ICD-10-CM | POA: Diagnosis not present

## 2019-06-07 DIAGNOSIS — D631 Anemia in chronic kidney disease: Secondary | ICD-10-CM | POA: Diagnosis not present

## 2019-06-07 DIAGNOSIS — N186 End stage renal disease: Secondary | ICD-10-CM | POA: Diagnosis not present

## 2019-06-07 DIAGNOSIS — D509 Iron deficiency anemia, unspecified: Secondary | ICD-10-CM | POA: Diagnosis not present

## 2019-06-08 ENCOUNTER — Other Ambulatory Visit: Payer: Self-pay | Admitting: Family Medicine

## 2019-06-08 NOTE — Telephone Encounter (Signed)
Na/ww  

## 2019-06-09 DIAGNOSIS — Z23 Encounter for immunization: Secondary | ICD-10-CM | POA: Diagnosis not present

## 2019-06-09 DIAGNOSIS — Z992 Dependence on renal dialysis: Secondary | ICD-10-CM | POA: Diagnosis not present

## 2019-06-09 DIAGNOSIS — D631 Anemia in chronic kidney disease: Secondary | ICD-10-CM | POA: Diagnosis not present

## 2019-06-09 DIAGNOSIS — D509 Iron deficiency anemia, unspecified: Secondary | ICD-10-CM | POA: Diagnosis not present

## 2019-06-09 DIAGNOSIS — N2581 Secondary hyperparathyroidism of renal origin: Secondary | ICD-10-CM | POA: Diagnosis not present

## 2019-06-09 DIAGNOSIS — N186 End stage renal disease: Secondary | ICD-10-CM | POA: Diagnosis not present

## 2019-06-11 DIAGNOSIS — Z992 Dependence on renal dialysis: Secondary | ICD-10-CM | POA: Diagnosis not present

## 2019-06-11 DIAGNOSIS — N2581 Secondary hyperparathyroidism of renal origin: Secondary | ICD-10-CM | POA: Diagnosis not present

## 2019-06-11 DIAGNOSIS — D631 Anemia in chronic kidney disease: Secondary | ICD-10-CM | POA: Diagnosis not present

## 2019-06-11 DIAGNOSIS — D509 Iron deficiency anemia, unspecified: Secondary | ICD-10-CM | POA: Diagnosis not present

## 2019-06-11 DIAGNOSIS — N186 End stage renal disease: Secondary | ICD-10-CM | POA: Diagnosis not present

## 2019-06-11 DIAGNOSIS — Z23 Encounter for immunization: Secondary | ICD-10-CM | POA: Diagnosis not present

## 2019-06-14 DIAGNOSIS — N2581 Secondary hyperparathyroidism of renal origin: Secondary | ICD-10-CM | POA: Diagnosis not present

## 2019-06-14 DIAGNOSIS — Z23 Encounter for immunization: Secondary | ICD-10-CM | POA: Diagnosis not present

## 2019-06-14 DIAGNOSIS — N186 End stage renal disease: Secondary | ICD-10-CM | POA: Diagnosis not present

## 2019-06-14 DIAGNOSIS — D509 Iron deficiency anemia, unspecified: Secondary | ICD-10-CM | POA: Diagnosis not present

## 2019-06-14 DIAGNOSIS — Z992 Dependence on renal dialysis: Secondary | ICD-10-CM | POA: Diagnosis not present

## 2019-06-14 DIAGNOSIS — D631 Anemia in chronic kidney disease: Secondary | ICD-10-CM | POA: Diagnosis not present

## 2019-06-16 DIAGNOSIS — Z23 Encounter for immunization: Secondary | ICD-10-CM | POA: Diagnosis not present

## 2019-06-16 DIAGNOSIS — D631 Anemia in chronic kidney disease: Secondary | ICD-10-CM | POA: Diagnosis not present

## 2019-06-16 DIAGNOSIS — Z992 Dependence on renal dialysis: Secondary | ICD-10-CM | POA: Diagnosis not present

## 2019-06-16 DIAGNOSIS — N2581 Secondary hyperparathyroidism of renal origin: Secondary | ICD-10-CM | POA: Diagnosis not present

## 2019-06-16 DIAGNOSIS — N186 End stage renal disease: Secondary | ICD-10-CM | POA: Diagnosis not present

## 2019-06-16 DIAGNOSIS — D509 Iron deficiency anemia, unspecified: Secondary | ICD-10-CM | POA: Diagnosis not present

## 2019-06-18 DIAGNOSIS — Z992 Dependence on renal dialysis: Secondary | ICD-10-CM | POA: Diagnosis not present

## 2019-06-18 DIAGNOSIS — Z23 Encounter for immunization: Secondary | ICD-10-CM | POA: Diagnosis not present

## 2019-06-18 DIAGNOSIS — D509 Iron deficiency anemia, unspecified: Secondary | ICD-10-CM | POA: Diagnosis not present

## 2019-06-18 DIAGNOSIS — D631 Anemia in chronic kidney disease: Secondary | ICD-10-CM | POA: Diagnosis not present

## 2019-06-18 DIAGNOSIS — N2581 Secondary hyperparathyroidism of renal origin: Secondary | ICD-10-CM | POA: Diagnosis not present

## 2019-06-18 DIAGNOSIS — N186 End stage renal disease: Secondary | ICD-10-CM | POA: Diagnosis not present

## 2019-06-21 DIAGNOSIS — N186 End stage renal disease: Secondary | ICD-10-CM | POA: Diagnosis not present

## 2019-06-21 DIAGNOSIS — D631 Anemia in chronic kidney disease: Secondary | ICD-10-CM | POA: Diagnosis not present

## 2019-06-21 DIAGNOSIS — Z992 Dependence on renal dialysis: Secondary | ICD-10-CM | POA: Diagnosis not present

## 2019-06-21 DIAGNOSIS — N2581 Secondary hyperparathyroidism of renal origin: Secondary | ICD-10-CM | POA: Diagnosis not present

## 2019-06-21 DIAGNOSIS — Z23 Encounter for immunization: Secondary | ICD-10-CM | POA: Diagnosis not present

## 2019-06-21 DIAGNOSIS — D509 Iron deficiency anemia, unspecified: Secondary | ICD-10-CM | POA: Diagnosis not present

## 2019-06-23 DIAGNOSIS — D509 Iron deficiency anemia, unspecified: Secondary | ICD-10-CM | POA: Diagnosis not present

## 2019-06-23 DIAGNOSIS — Z992 Dependence on renal dialysis: Secondary | ICD-10-CM | POA: Diagnosis not present

## 2019-06-23 DIAGNOSIS — D631 Anemia in chronic kidney disease: Secondary | ICD-10-CM | POA: Diagnosis not present

## 2019-06-23 DIAGNOSIS — N186 End stage renal disease: Secondary | ICD-10-CM | POA: Diagnosis not present

## 2019-06-23 DIAGNOSIS — N2581 Secondary hyperparathyroidism of renal origin: Secondary | ICD-10-CM | POA: Diagnosis not present

## 2019-06-23 DIAGNOSIS — Z23 Encounter for immunization: Secondary | ICD-10-CM | POA: Diagnosis not present

## 2019-06-24 ENCOUNTER — Ambulatory Visit: Payer: Medicare Other | Admitting: Family Medicine

## 2019-06-24 DIAGNOSIS — L89892 Pressure ulcer of other site, stage 2: Secondary | ICD-10-CM | POA: Diagnosis not present

## 2019-06-24 DIAGNOSIS — E1151 Type 2 diabetes mellitus with diabetic peripheral angiopathy without gangrene: Secondary | ICD-10-CM | POA: Diagnosis not present

## 2019-06-24 DIAGNOSIS — M79674 Pain in right toe(s): Secondary | ICD-10-CM | POA: Diagnosis not present

## 2019-06-24 DIAGNOSIS — M79671 Pain in right foot: Secondary | ICD-10-CM | POA: Diagnosis not present

## 2019-06-25 DIAGNOSIS — Z23 Encounter for immunization: Secondary | ICD-10-CM | POA: Diagnosis not present

## 2019-06-25 DIAGNOSIS — D631 Anemia in chronic kidney disease: Secondary | ICD-10-CM | POA: Diagnosis not present

## 2019-06-25 DIAGNOSIS — N186 End stage renal disease: Secondary | ICD-10-CM | POA: Diagnosis not present

## 2019-06-25 DIAGNOSIS — Z992 Dependence on renal dialysis: Secondary | ICD-10-CM | POA: Diagnosis not present

## 2019-06-25 DIAGNOSIS — D509 Iron deficiency anemia, unspecified: Secondary | ICD-10-CM | POA: Diagnosis not present

## 2019-06-25 DIAGNOSIS — N2581 Secondary hyperparathyroidism of renal origin: Secondary | ICD-10-CM | POA: Diagnosis not present

## 2019-06-27 DIAGNOSIS — Z992 Dependence on renal dialysis: Secondary | ICD-10-CM | POA: Diagnosis not present

## 2019-06-27 DIAGNOSIS — N186 End stage renal disease: Secondary | ICD-10-CM | POA: Diagnosis not present

## 2019-06-28 DIAGNOSIS — D631 Anemia in chronic kidney disease: Secondary | ICD-10-CM | POA: Diagnosis not present

## 2019-06-28 DIAGNOSIS — N2581 Secondary hyperparathyroidism of renal origin: Secondary | ICD-10-CM | POA: Diagnosis not present

## 2019-06-28 DIAGNOSIS — Z992 Dependence on renal dialysis: Secondary | ICD-10-CM | POA: Diagnosis not present

## 2019-06-28 DIAGNOSIS — D509 Iron deficiency anemia, unspecified: Secondary | ICD-10-CM | POA: Diagnosis not present

## 2019-06-28 DIAGNOSIS — N186 End stage renal disease: Secondary | ICD-10-CM | POA: Diagnosis not present

## 2019-06-30 ENCOUNTER — Other Ambulatory Visit (HOSPITAL_COMMUNITY): Payer: Self-pay | Admitting: Nephrology

## 2019-06-30 DIAGNOSIS — N186 End stage renal disease: Secondary | ICD-10-CM | POA: Diagnosis not present

## 2019-06-30 DIAGNOSIS — Z992 Dependence on renal dialysis: Secondary | ICD-10-CM | POA: Diagnosis not present

## 2019-06-30 DIAGNOSIS — D631 Anemia in chronic kidney disease: Secondary | ICD-10-CM | POA: Diagnosis not present

## 2019-06-30 DIAGNOSIS — D509 Iron deficiency anemia, unspecified: Secondary | ICD-10-CM | POA: Diagnosis not present

## 2019-06-30 DIAGNOSIS — N2581 Secondary hyperparathyroidism of renal origin: Secondary | ICD-10-CM | POA: Diagnosis not present

## 2019-07-02 DIAGNOSIS — D509 Iron deficiency anemia, unspecified: Secondary | ICD-10-CM | POA: Diagnosis not present

## 2019-07-02 DIAGNOSIS — D631 Anemia in chronic kidney disease: Secondary | ICD-10-CM | POA: Diagnosis not present

## 2019-07-02 DIAGNOSIS — Z992 Dependence on renal dialysis: Secondary | ICD-10-CM | POA: Diagnosis not present

## 2019-07-02 DIAGNOSIS — N2581 Secondary hyperparathyroidism of renal origin: Secondary | ICD-10-CM | POA: Diagnosis not present

## 2019-07-02 DIAGNOSIS — N186 End stage renal disease: Secondary | ICD-10-CM | POA: Diagnosis not present

## 2019-07-05 DIAGNOSIS — D631 Anemia in chronic kidney disease: Secondary | ICD-10-CM | POA: Diagnosis not present

## 2019-07-05 DIAGNOSIS — Z23 Encounter for immunization: Secondary | ICD-10-CM | POA: Diagnosis not present

## 2019-07-05 DIAGNOSIS — D509 Iron deficiency anemia, unspecified: Secondary | ICD-10-CM | POA: Diagnosis not present

## 2019-07-05 DIAGNOSIS — Z992 Dependence on renal dialysis: Secondary | ICD-10-CM | POA: Diagnosis not present

## 2019-07-05 DIAGNOSIS — N2581 Secondary hyperparathyroidism of renal origin: Secondary | ICD-10-CM | POA: Diagnosis not present

## 2019-07-05 DIAGNOSIS — N186 End stage renal disease: Secondary | ICD-10-CM | POA: Diagnosis not present

## 2019-07-06 ENCOUNTER — Ambulatory Visit (HOSPITAL_COMMUNITY)
Admission: RE | Admit: 2019-07-06 | Discharge: 2019-07-06 | Disposition: A | Discharge status: Home / Self Care | Payer: Medicare Other | Source: Ambulatory Visit | Attending: Nephrology | Admitting: Nephrology

## 2019-07-06 ENCOUNTER — Other Ambulatory Visit: Payer: Self-pay

## 2019-07-06 DIAGNOSIS — N186 End stage renal disease: Secondary | ICD-10-CM | POA: Diagnosis not present

## 2019-07-06 DIAGNOSIS — Z4901 Encounter for fitting and adjustment of extracorporeal dialysis catheter: Secondary | ICD-10-CM | POA: Diagnosis not present

## 2019-07-06 HISTORY — PX: IR REMOVAL TUN CV CATH W/O FL: IMG2289

## 2019-07-06 MED ORDER — LIDOCAINE HCL 1 % IJ SOLN
INTRAMUSCULAR | Status: AC
  Filled 2019-07-06: qty 20

## 2019-07-06 MED ORDER — LIDOCAINE HCL (PF) 1 % IJ SOLN
INTRAMUSCULAR | Status: DC | PRN
  Administered 2019-07-06: 10 mL

## 2019-07-06 MED ORDER — CHLORHEXIDINE GLUCONATE 4 % EX LIQD
CUTANEOUS | Status: AC
  Filled 2019-07-06: qty 15

## 2019-07-06 NOTE — Procedures (Signed)
PROCEDURE SUMMARY:  Successful removal of tunneled hemodialysis catheter.  Patient tolerated well.  EBL < 5 mL  See full dictation in Imaging for details.  Sherrilynn Gudgel S Maxemiliano Riel PA-C 07/06/2019 3:05 PM

## 2019-07-07 DIAGNOSIS — N186 End stage renal disease: Secondary | ICD-10-CM | POA: Diagnosis not present

## 2019-07-07 DIAGNOSIS — D631 Anemia in chronic kidney disease: Secondary | ICD-10-CM | POA: Diagnosis not present

## 2019-07-07 DIAGNOSIS — Z992 Dependence on renal dialysis: Secondary | ICD-10-CM | POA: Diagnosis not present

## 2019-07-07 DIAGNOSIS — N2581 Secondary hyperparathyroidism of renal origin: Secondary | ICD-10-CM | POA: Diagnosis not present

## 2019-07-07 DIAGNOSIS — D509 Iron deficiency anemia, unspecified: Secondary | ICD-10-CM | POA: Diagnosis not present

## 2019-07-08 DIAGNOSIS — M79672 Pain in left foot: Secondary | ICD-10-CM | POA: Diagnosis not present

## 2019-07-08 DIAGNOSIS — M79671 Pain in right foot: Secondary | ICD-10-CM | POA: Diagnosis not present

## 2019-07-08 DIAGNOSIS — L89892 Pressure ulcer of other site, stage 2: Secondary | ICD-10-CM | POA: Diagnosis not present

## 2019-07-08 DIAGNOSIS — E1151 Type 2 diabetes mellitus with diabetic peripheral angiopathy without gangrene: Secondary | ICD-10-CM | POA: Diagnosis not present

## 2019-07-09 DIAGNOSIS — N2581 Secondary hyperparathyroidism of renal origin: Secondary | ICD-10-CM | POA: Diagnosis not present

## 2019-07-09 DIAGNOSIS — N186 End stage renal disease: Secondary | ICD-10-CM | POA: Diagnosis not present

## 2019-07-09 DIAGNOSIS — D509 Iron deficiency anemia, unspecified: Secondary | ICD-10-CM | POA: Diagnosis not present

## 2019-07-09 DIAGNOSIS — D631 Anemia in chronic kidney disease: Secondary | ICD-10-CM | POA: Diagnosis not present

## 2019-07-09 DIAGNOSIS — Z992 Dependence on renal dialysis: Secondary | ICD-10-CM | POA: Diagnosis not present

## 2019-07-12 DIAGNOSIS — N186 End stage renal disease: Secondary | ICD-10-CM | POA: Diagnosis not present

## 2019-07-12 DIAGNOSIS — N2581 Secondary hyperparathyroidism of renal origin: Secondary | ICD-10-CM | POA: Diagnosis not present

## 2019-07-12 DIAGNOSIS — D509 Iron deficiency anemia, unspecified: Secondary | ICD-10-CM | POA: Diagnosis not present

## 2019-07-12 DIAGNOSIS — D631 Anemia in chronic kidney disease: Secondary | ICD-10-CM | POA: Diagnosis not present

## 2019-07-12 DIAGNOSIS — Z992 Dependence on renal dialysis: Secondary | ICD-10-CM | POA: Diagnosis not present

## 2019-07-14 DIAGNOSIS — Z992 Dependence on renal dialysis: Secondary | ICD-10-CM | POA: Diagnosis not present

## 2019-07-14 DIAGNOSIS — D509 Iron deficiency anemia, unspecified: Secondary | ICD-10-CM | POA: Diagnosis not present

## 2019-07-14 DIAGNOSIS — D631 Anemia in chronic kidney disease: Secondary | ICD-10-CM | POA: Diagnosis not present

## 2019-07-14 DIAGNOSIS — N2581 Secondary hyperparathyroidism of renal origin: Secondary | ICD-10-CM | POA: Diagnosis not present

## 2019-07-14 DIAGNOSIS — N186 End stage renal disease: Secondary | ICD-10-CM | POA: Diagnosis not present

## 2019-07-16 DIAGNOSIS — N186 End stage renal disease: Secondary | ICD-10-CM | POA: Diagnosis not present

## 2019-07-16 DIAGNOSIS — N2581 Secondary hyperparathyroidism of renal origin: Secondary | ICD-10-CM | POA: Diagnosis not present

## 2019-07-16 DIAGNOSIS — Z992 Dependence on renal dialysis: Secondary | ICD-10-CM | POA: Diagnosis not present

## 2019-07-16 DIAGNOSIS — D631 Anemia in chronic kidney disease: Secondary | ICD-10-CM | POA: Diagnosis not present

## 2019-07-16 DIAGNOSIS — D509 Iron deficiency anemia, unspecified: Secondary | ICD-10-CM | POA: Diagnosis not present

## 2019-07-19 DIAGNOSIS — N2581 Secondary hyperparathyroidism of renal origin: Secondary | ICD-10-CM | POA: Diagnosis not present

## 2019-07-19 DIAGNOSIS — N186 End stage renal disease: Secondary | ICD-10-CM | POA: Diagnosis not present

## 2019-07-19 DIAGNOSIS — D509 Iron deficiency anemia, unspecified: Secondary | ICD-10-CM | POA: Diagnosis not present

## 2019-07-19 DIAGNOSIS — Z794 Long term (current) use of insulin: Secondary | ICD-10-CM | POA: Diagnosis not present

## 2019-07-19 DIAGNOSIS — Z992 Dependence on renal dialysis: Secondary | ICD-10-CM | POA: Diagnosis not present

## 2019-07-19 DIAGNOSIS — D631 Anemia in chronic kidney disease: Secondary | ICD-10-CM | POA: Diagnosis not present

## 2019-07-19 DIAGNOSIS — E119 Type 2 diabetes mellitus without complications: Secondary | ICD-10-CM | POA: Diagnosis not present

## 2019-07-21 DIAGNOSIS — D631 Anemia in chronic kidney disease: Secondary | ICD-10-CM | POA: Diagnosis not present

## 2019-07-21 DIAGNOSIS — Z992 Dependence on renal dialysis: Secondary | ICD-10-CM | POA: Diagnosis not present

## 2019-07-21 DIAGNOSIS — D509 Iron deficiency anemia, unspecified: Secondary | ICD-10-CM | POA: Diagnosis not present

## 2019-07-21 DIAGNOSIS — N2581 Secondary hyperparathyroidism of renal origin: Secondary | ICD-10-CM | POA: Diagnosis not present

## 2019-07-21 DIAGNOSIS — N186 End stage renal disease: Secondary | ICD-10-CM | POA: Diagnosis not present

## 2019-07-22 DIAGNOSIS — L110 Acquired keratosis follicularis: Secondary | ICD-10-CM | POA: Diagnosis not present

## 2019-07-22 DIAGNOSIS — M79671 Pain in right foot: Secondary | ICD-10-CM | POA: Diagnosis not present

## 2019-07-22 DIAGNOSIS — Q828 Other specified congenital malformations of skin: Secondary | ICD-10-CM | POA: Diagnosis not present

## 2019-07-22 DIAGNOSIS — M79672 Pain in left foot: Secondary | ICD-10-CM | POA: Diagnosis not present

## 2019-07-23 DIAGNOSIS — Z992 Dependence on renal dialysis: Secondary | ICD-10-CM | POA: Diagnosis not present

## 2019-07-23 DIAGNOSIS — D631 Anemia in chronic kidney disease: Secondary | ICD-10-CM | POA: Diagnosis not present

## 2019-07-23 DIAGNOSIS — N186 End stage renal disease: Secondary | ICD-10-CM | POA: Diagnosis not present

## 2019-07-23 DIAGNOSIS — N2581 Secondary hyperparathyroidism of renal origin: Secondary | ICD-10-CM | POA: Diagnosis not present

## 2019-07-23 DIAGNOSIS — D509 Iron deficiency anemia, unspecified: Secondary | ICD-10-CM | POA: Diagnosis not present

## 2019-07-26 DIAGNOSIS — D631 Anemia in chronic kidney disease: Secondary | ICD-10-CM | POA: Diagnosis not present

## 2019-07-26 DIAGNOSIS — N186 End stage renal disease: Secondary | ICD-10-CM | POA: Diagnosis not present

## 2019-07-26 DIAGNOSIS — D509 Iron deficiency anemia, unspecified: Secondary | ICD-10-CM | POA: Diagnosis not present

## 2019-07-26 DIAGNOSIS — N2581 Secondary hyperparathyroidism of renal origin: Secondary | ICD-10-CM | POA: Diagnosis not present

## 2019-07-26 DIAGNOSIS — Z992 Dependence on renal dialysis: Secondary | ICD-10-CM | POA: Diagnosis not present

## 2019-07-28 DIAGNOSIS — D509 Iron deficiency anemia, unspecified: Secondary | ICD-10-CM | POA: Diagnosis not present

## 2019-07-28 DIAGNOSIS — N2581 Secondary hyperparathyroidism of renal origin: Secondary | ICD-10-CM | POA: Diagnosis not present

## 2019-07-28 DIAGNOSIS — N186 End stage renal disease: Secondary | ICD-10-CM | POA: Diagnosis not present

## 2019-07-28 DIAGNOSIS — D631 Anemia in chronic kidney disease: Secondary | ICD-10-CM | POA: Diagnosis not present

## 2019-07-28 DIAGNOSIS — Z992 Dependence on renal dialysis: Secondary | ICD-10-CM | POA: Diagnosis not present

## 2019-07-30 DIAGNOSIS — Z992 Dependence on renal dialysis: Secondary | ICD-10-CM | POA: Diagnosis not present

## 2019-07-30 DIAGNOSIS — D631 Anemia in chronic kidney disease: Secondary | ICD-10-CM | POA: Diagnosis not present

## 2019-07-30 DIAGNOSIS — N186 End stage renal disease: Secondary | ICD-10-CM | POA: Diagnosis not present

## 2019-07-30 DIAGNOSIS — D509 Iron deficiency anemia, unspecified: Secondary | ICD-10-CM | POA: Diagnosis not present

## 2019-07-30 DIAGNOSIS — N2581 Secondary hyperparathyroidism of renal origin: Secondary | ICD-10-CM | POA: Diagnosis not present

## 2019-08-02 DIAGNOSIS — N186 End stage renal disease: Secondary | ICD-10-CM | POA: Diagnosis not present

## 2019-08-02 DIAGNOSIS — Z992 Dependence on renal dialysis: Secondary | ICD-10-CM | POA: Diagnosis not present

## 2019-08-02 DIAGNOSIS — D509 Iron deficiency anemia, unspecified: Secondary | ICD-10-CM | POA: Diagnosis not present

## 2019-08-02 DIAGNOSIS — D631 Anemia in chronic kidney disease: Secondary | ICD-10-CM | POA: Diagnosis not present

## 2019-08-02 DIAGNOSIS — N2581 Secondary hyperparathyroidism of renal origin: Secondary | ICD-10-CM | POA: Diagnosis not present

## 2019-08-03 DIAGNOSIS — E1151 Type 2 diabetes mellitus with diabetic peripheral angiopathy without gangrene: Secondary | ICD-10-CM | POA: Diagnosis not present

## 2019-08-03 DIAGNOSIS — B351 Tinea unguium: Secondary | ICD-10-CM | POA: Diagnosis not present

## 2019-08-03 DIAGNOSIS — Z23 Encounter for immunization: Secondary | ICD-10-CM | POA: Diagnosis not present

## 2019-08-03 DIAGNOSIS — M79672 Pain in left foot: Secondary | ICD-10-CM | POA: Diagnosis not present

## 2019-08-03 DIAGNOSIS — M79671 Pain in right foot: Secondary | ICD-10-CM | POA: Diagnosis not present

## 2019-08-04 DIAGNOSIS — N2581 Secondary hyperparathyroidism of renal origin: Secondary | ICD-10-CM | POA: Diagnosis not present

## 2019-08-04 DIAGNOSIS — Z992 Dependence on renal dialysis: Secondary | ICD-10-CM | POA: Diagnosis not present

## 2019-08-04 DIAGNOSIS — D631 Anemia in chronic kidney disease: Secondary | ICD-10-CM | POA: Diagnosis not present

## 2019-08-04 DIAGNOSIS — D509 Iron deficiency anemia, unspecified: Secondary | ICD-10-CM | POA: Diagnosis not present

## 2019-08-04 DIAGNOSIS — N186 End stage renal disease: Secondary | ICD-10-CM | POA: Diagnosis not present

## 2019-08-05 ENCOUNTER — Other Ambulatory Visit: Payer: Self-pay | Admitting: *Deleted

## 2019-08-05 DIAGNOSIS — M79676 Pain in unspecified toe(s): Secondary | ICD-10-CM

## 2019-08-06 DIAGNOSIS — D509 Iron deficiency anemia, unspecified: Secondary | ICD-10-CM | POA: Diagnosis not present

## 2019-08-06 DIAGNOSIS — N186 End stage renal disease: Secondary | ICD-10-CM | POA: Diagnosis not present

## 2019-08-06 DIAGNOSIS — D631 Anemia in chronic kidney disease: Secondary | ICD-10-CM | POA: Diagnosis not present

## 2019-08-06 DIAGNOSIS — N2581 Secondary hyperparathyroidism of renal origin: Secondary | ICD-10-CM | POA: Diagnosis not present

## 2019-08-06 DIAGNOSIS — Z992 Dependence on renal dialysis: Secondary | ICD-10-CM | POA: Diagnosis not present

## 2019-08-09 DIAGNOSIS — Z992 Dependence on renal dialysis: Secondary | ICD-10-CM | POA: Diagnosis not present

## 2019-08-09 DIAGNOSIS — D509 Iron deficiency anemia, unspecified: Secondary | ICD-10-CM | POA: Diagnosis not present

## 2019-08-09 DIAGNOSIS — N186 End stage renal disease: Secondary | ICD-10-CM | POA: Diagnosis not present

## 2019-08-09 DIAGNOSIS — D631 Anemia in chronic kidney disease: Secondary | ICD-10-CM | POA: Diagnosis not present

## 2019-08-09 DIAGNOSIS — N2581 Secondary hyperparathyroidism of renal origin: Secondary | ICD-10-CM | POA: Diagnosis not present

## 2019-08-11 DIAGNOSIS — D509 Iron deficiency anemia, unspecified: Secondary | ICD-10-CM | POA: Diagnosis not present

## 2019-08-11 DIAGNOSIS — Z992 Dependence on renal dialysis: Secondary | ICD-10-CM | POA: Diagnosis not present

## 2019-08-11 DIAGNOSIS — N2581 Secondary hyperparathyroidism of renal origin: Secondary | ICD-10-CM | POA: Diagnosis not present

## 2019-08-11 DIAGNOSIS — D631 Anemia in chronic kidney disease: Secondary | ICD-10-CM | POA: Diagnosis not present

## 2019-08-11 DIAGNOSIS — N186 End stage renal disease: Secondary | ICD-10-CM | POA: Diagnosis not present

## 2019-08-12 ENCOUNTER — Encounter: Payer: Self-pay | Admitting: Vascular Surgery

## 2019-08-12 ENCOUNTER — Ambulatory Visit (INDEPENDENT_AMBULATORY_CARE_PROVIDER_SITE_OTHER): Payer: Medicare Other | Admitting: Vascular Surgery

## 2019-08-12 ENCOUNTER — Ambulatory Visit (HOSPITAL_COMMUNITY)
Admission: RE | Admit: 2019-08-12 | Discharge: 2019-08-12 | Disposition: A | Discharge status: Home / Self Care | Payer: Medicare Other | Source: Ambulatory Visit | Attending: Surgery | Admitting: Surgery

## 2019-08-12 ENCOUNTER — Other Ambulatory Visit: Payer: Self-pay

## 2019-08-12 VITALS — BP 175/79 | HR 74 | Temp 97.7°F | Resp 20 | Ht 63.0 in | Wt 250.0 lb

## 2019-08-12 DIAGNOSIS — I739 Peripheral vascular disease, unspecified: Secondary | ICD-10-CM

## 2019-08-12 DIAGNOSIS — M79676 Pain in unspecified toe(s): Secondary | ICD-10-CM

## 2019-08-12 NOTE — Progress Notes (Signed)
Referring Physician: Alphia Moh DPM  Patient name: Christy Zhang [facility] MRN: [id number] DOB: Nov 03, 1955 Sex: female  REASON FOR CONSULT: Right second toe discoloration with toe ulcer  HPI: Christy Zhang is a 64 y.o. female, developed ulceration on her right second toe several months ago.  She has noted that the right second toe has changed colors as well.  It has become darker.  It is not painful.  She has no drainage.  She has no fever or chills.  She is minimally ambulatory but does get around some with a walker.  She was in a wheelchair today.  Does not describe rest pain or claudication symptoms.  She states that her walking is difficult due to weakness caused by her hemodialysis.  She is a former tobacco abuser but quit in 1986.  She is currently on hemodialysis Monday Wednesday and Friday.  Other risk factors include her diabetes.  She states her A1c is usually in the 6 range.  She does have end-stage renal disease and currently has a right upper arm AV graft which is functioning well.    Other medical problems include obesity, lipidemia hypertension all of which are currently stable.  Past Medical History:  Diagnosis Date  . Anemia   . Arthritis   . Asthma    in the past  . CHF (congestive heart failure) ([place])   . Chronic kidney disease    Renal failure, dialysis on M/W/F  . 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 End stage renal disease ([place])   . End stage renal disease on dialysis ([place])   . Hyperlipidemia   . Hypertension   . Neuromuscular disorder [facility])    Past Surgical History:  Procedure Laterality Date  . A/V FISTULAGRAM Right 02/04/2017   Procedure: A/V Fistulagram;  Surgeon: Waynetta Sandy, MD;  Location: [facility];  Service: Cardiovascular;  Laterality: Right;  . AV FISTULA PLACEMENT Right 08/25/2014   Procedure: RIGHT BRACHIOCEPHALIC ARTERIOVENOUS (AV) FISTULA CREATION;  Surgeon: Mal Misty, MD;  Location:  [facility] OR;  Service: Vascular;  Laterality: Right;  . AV FISTULA PLACEMENT Right 03/18/2019   Procedure: INSERTION OF ARTERIOVENOUS (AV) GORE-TEX GRAFT RIGHT ARM;  Surgeon: Serafina Mitchell, MD;  Location: [place];  Service: Vascular;  Laterality: Right;  . BASCILIC VEIN TRANSPOSITION Right 12/24/2018   Procedure: FIRST STAGE BASILIC VEIN TRANSPOSITION RIGHT UPPER ARM;  Surgeon: Serafina Mitchell, MD;  Location: [place];  Service: Vascular;  Laterality: Right;  . FISTULA SUPERFICIALIZATION Right 11/03/2014   Procedure: RIGHT UPPER ARM FISTULA SUPERFICIALIZATION;  Surgeon: Mal Misty, MD;  Location: [place];  Service: Vascular;  Laterality: Right;  . FISTULOGRAM Right 01/16/2016   Procedure: RIGHT UPPER EXTREMITY ARTERIOVENOUS FISTULOGRAM WITH BALLOON ANGIOPLASTY;  Surgeon: Vickie Epley, MD;  Location: [facility];  Service: Vascular;  Laterality: Right;  . INSERTION OF DIALYSIS CATHETER     X4  . IR FLUORO GUIDE CV LINE RIGHT  12/03/2018  . IR REMOVAL TUN CV CATH W/O FL  07/06/2019  . IR THROMBECTOMY AV FISTULA W/THROMBOLYSIS/PTA INC/SHUNT/IMG RIGHT Right 12/03/2018  . IR US GUIDE VASC ACCESS RIGHT  12/03/2018  . IR US GUIDE VASC ACCESS RIGHT  12/03/2018  . LIGATION OF COMPETING BRANCHES OF ARTERIOVENOUS FISTULA Right 11/03/2014   Procedure: LIGATION OF COMPETING BRANCHE x1 OF RIGHT UPPER ARM ARTERIOVENOUS FISTULA;  Surgeon: Mal Misty, MD;  Location: [place];  Service: Vascular;  Laterality: Right;  .  PERIPHERAL VASCULAR BALLOON ANGIOPLASTY Right 02/04/2017   Procedure: PERIPHERAL VASCULAR BALLOON ANGIOPLASTY;  Surgeon: Waynetta Sandy, MD;  Location: [place] [facility];  Service: Cardiovascular;  Laterality: Right;  . TUBAL LIGATION      Family History  Problem Relation Age of Onset  . Diabetes Mother   . Hypertension Mother   . Cancer Father   . Diabetes Father   . Hypertension Sister   . Diabetes Daughter   . Heart disease Daughter   . Hypertension Daughter   . Diabetes Son   . Heart disease  Son   . Hypertension Son   . Peripheral vascular disease Son        amputation    SOCIAL HISTORY: Social History   Socioeconomic History  . Marital status: Married    Spouse name: Not on file  . Number of children: Not on file  . Years of education: Not on file  . Highest education level: Not on file  Occupational History  . Not on file  Tobacco Use  . Smoking status: Former Smoker    Types: Cigarettes    Quit date: 07/25/1984    Years since quitting: 35.0  . Smokeless tobacco: Never Used  Substance and Sexual Activity  . Alcohol use: No    Alcohol/week: 0.0 standard drinks  . Drug use: No  . Sexual activity: Not on file  Other Topics Concern  . Not on file  Social History Narrative  . Not on file   Social Determinants of Health   Financial Resource Strain:   . Difficulty of Paying Living Expenses:   Food Insecurity:   . Worried About Charity fundraiser in the Last Year:   . Arboriculturist in the Last Year:   Transportation Needs:   . Film/video editor (Medical):   Marland Kitchen Lack of Transportation (Non-Medical):   Physical Activity:   . Days of Exercise per Week:   . Minutes of Exercise per Session:   Stress:   . Feeling of Stress :   Social Connections:   . Frequency of Communication with Friends and Family:   . Frequency of Social Gatherings with Friends and Family:   . Attends Religious Services:   . Active Member of Clubs or Organizations:   . Attends Archivist Meetings:   Marland Kitchen Marital Status:   Intimate Partner Violence:   . Fear of Current or Ex-Partner:   . Emotionally Abused:   Marland Kitchen Physically Abused:   . Sexually Abused:     Allergies  Allergen Reactions  . Sulfa Antibiotics Swelling    Current Outpatient Medications  Medication Sig Dispense Refill  . acetaminophen (TYLENOL) 325 MG tablet Take 650 mg by mouth every 6 (six) hours as needed for moderate pain or headache.    Marland Kitchen amLODipine (NORVASC) 5 MG tablet Take 5 mg by mouth at  bedtime.     Marland Kitchen atorvastatin (LIPITOR) 40 MG tablet Take 1 tablet (40 mg total) by mouth daily. 90 tablet 3  . blood glucose meter kit and supplies KIT Dispense based on patient and insurance preference. Use up to four times daily as directed. (FOR ICD-9 250.00, 250.01). 1 each 0  . Blood Glucose Monitoring Suppl (BLOOD GLUCOSE MONITOR SYSTEM) w/Device KIT Use to check Blood Sugar up to 4 x daily as directed. Dispense based on pt insurance. E11.9 1 each 0  . diphenhydrAMINE (BENADRYL) 25 mg capsule Take 25 mg by mouth every 8 (eight) hours as needed  for allergies or sleep.     . Dulaglutide (TRULICITY) 1.5 IH/4.7QQ SOPN Inject content of one pen under the skin weekly on Saturday (Patient taking differently: Inject 1.5 mg as directed every Saturday. ) 4 pen 12  . fluocinonide-emollient (LIDEX-E) 0.05 % cream Apply 1 application topically 2 (two) times daily. To affected areas (Patient taking differently: Apply 1 application topically 2 (two) times daily as needed (skin spots). To affected areas) 60 g 5  . glucose blood test strip Use to test blood sugars QID 100 each 12  . Insulin Glargine, 1 Unit Dial, (TOUJEO SOLOSTAR) 300 UNIT/ML SOPN Inject 100 Units into the skin daily. 10 mL 2  . Lancets Misc. KIT 1 each by Does not apply route 4 (four) times daily as needed. 120 each 11  . metoprolol succinate (TOPROL-XL) 100 MG 24 hr tablet TAKE ONE TABLET BY MOUTH DAILY. TAKE WITH OR IMMEDIATELY FOLLOWING A MEAL. 30 tablet 0  . multivitamin (RENA-VIT) TABS tablet Take 1 tablet by mouth daily.    . sevelamer carbonate (RENVELA) 800 MG tablet Take 1,600-2,400 mg by mouth See admin instructions. Take 2400 mg with each meal and 1600 mg with each snack     No current facility-administered medications for this visit.    ROS:   General:  No weight loss, Fever, chills  HEENT: No recent headaches, no nasal bleeding, no visual changes, no sore throat  Neurologic: No dizziness, blackouts, seizures. No recent  symptoms of stroke or mini- stroke. No recent episodes of slurred speech, or temporary blindness.  Cardiac: No recent episodes of chest pain/pressure, no shortness of breath at rest.  No shortness of breath with exertion.  Denies history of atrial fibrillation or irregular heartbeat  Vascular: No history of rest pain in feet.  No history of claudication.  No history of non-healing ulcer, No history of DVT   Pulmonary: No home oxygen, no productive cough, no hemoptysis,  No asthma or wheezing  Musculoskeletal:  [ ]  Arthritis, [ ]  Low back pain,  [ ]  Joint pain  Hematologic:No history of hypercoagulable state.  No history of easy bleeding.  No history of anemia  Gastrointestinal: No hematochezia or melena,  No gastroesophageal reflux, no trouble swallowing  Urinary: [X]  chronic Kidney disease, [X]  on HD - [X]  MWF or [ ]  TTHS, [ ]  Burning with urination, [ ]  Frequent urination, [ ]  Difficulty urinating;   Skin: No rashes  Psychological: No history of anxiety,  No history of depression   Physical Examination  Vitals:   08/12/19 0955  BP: (!) 175/79  Pulse: 74  Resp: 20  Temp: 97.7 F (36.5 C)  SpO2: 97%  Weight: 250 lb (113.4 kg)  Height: 5' 3"  (1.6 m)    Body mass index is 44.29 kg/m.  General:  Alert and oriented, no acute distress HEENT: Normal Neck: No JVD Cardiac: Regular Rate and Rhythm Abdomen: Soft, non-tender, non-distended, obese Skin: No rash, right second toe discolored no obvious ulceration at this point Extremity Pulses:  2+ radial, brachial, absent dorsalis pedis, posterior tibial pulses bilaterally Musculoskeletal: No deformity or edema  Neurologic: Upper and lower extremity motor 5/5 and symmetric  DATA:  Patient had bilateral ABIs performed today.  This showed a toe pressure on the right side of 95 left was 108.  There were biphasic to monophasic waveforms.  Unfortunately ABI was not reliable secondary to calcification.  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  However, since her ABIs  were not completely normal with calcified vessels.  I believe she needs a duplex ultrasound to further define the anatomy in her right leg.  If she has no large vessel occlusive disease probably no other intervention except close observation of the right second toe and foot protection.  However, if she does have significant arterial occlusive disease on duplex scan which consider an arteriogram.  Would defer this for now unless she has significant occlusive disease as she will be high risk of bleeding complications due to her obesity for any arteriogram.   PLAN: Right lower extremity duplex exam virtual visit scheduled within the next couple of weeks.  Further plan depending on outcome of the duplex scan.  Of note she should have adequate perfusion for wound healing with a toe pressure greater than 100 bilaterally.  Hopefully she will not require amputation of her right second toe for small vessel disease.  Currently though she has no open wound she has no pain so no indication to proceed with this currently.   Ruta Hinds, MD [facility] Office: [phone] Pager: [phone]

## 2019-08-13 ENCOUNTER — Other Ambulatory Visit: Payer: Self-pay | Admitting: *Deleted

## 2019-08-13 DIAGNOSIS — Z992 Dependence on renal dialysis: Secondary | ICD-10-CM | POA: Diagnosis not present

## 2019-08-13 DIAGNOSIS — I739 Peripheral vascular disease, unspecified: Secondary | ICD-10-CM

## 2019-08-13 DIAGNOSIS — D631 Anemia in chronic kidney disease: Secondary | ICD-10-CM | POA: Diagnosis not present

## 2019-08-13 DIAGNOSIS — N2581 Secondary hyperparathyroidism of renal origin: Secondary | ICD-10-CM | POA: Diagnosis not present

## 2019-08-13 DIAGNOSIS — D509 Iron deficiency anemia, unspecified: Secondary | ICD-10-CM | POA: Diagnosis not present

## 2019-08-13 DIAGNOSIS — N186 End stage renal disease: Secondary | ICD-10-CM | POA: Diagnosis not present

## 2019-08-16 DIAGNOSIS — D631 Anemia in chronic kidney disease: Secondary | ICD-10-CM | POA: Diagnosis not present

## 2019-08-16 DIAGNOSIS — Z992 Dependence on renal dialysis: Secondary | ICD-10-CM | POA: Diagnosis not present

## 2019-08-16 DIAGNOSIS — N186 End stage renal disease: Secondary | ICD-10-CM | POA: Diagnosis not present

## 2019-08-16 DIAGNOSIS — D509 Iron deficiency anemia, unspecified: Secondary | ICD-10-CM | POA: Diagnosis not present

## 2019-08-16 DIAGNOSIS — N2581 Secondary hyperparathyroidism of renal origin: Secondary | ICD-10-CM | POA: Diagnosis not present

## 2019-08-17 ENCOUNTER — Encounter: Payer: Self-pay | Admitting: Family Medicine

## 2019-08-17 ENCOUNTER — Ambulatory Visit (INDEPENDENT_AMBULATORY_CARE_PROVIDER_SITE_OTHER): Payer: Medicare Other | Admitting: Family Medicine

## 2019-08-17 ENCOUNTER — Other Ambulatory Visit: Payer: Self-pay

## 2019-08-17 VITALS — BP 139/71 | HR 73 | Temp 97.8°F | Ht 63.0 in | Wt 247.8 lb

## 2019-08-17 DIAGNOSIS — E1122 Type 2 diabetes mellitus with diabetic chronic kidney disease: Secondary | ICD-10-CM | POA: Diagnosis not present

## 2019-08-17 DIAGNOSIS — Z992 Dependence on renal dialysis: Secondary | ICD-10-CM

## 2019-08-17 DIAGNOSIS — Z794 Long term (current) use of insulin: Secondary | ICD-10-CM | POA: Diagnosis not present

## 2019-08-17 DIAGNOSIS — E785 Hyperlipidemia, unspecified: Secondary | ICD-10-CM | POA: Diagnosis not present

## 2019-08-17 DIAGNOSIS — I151 Hypertension secondary to other renal disorders: Secondary | ICD-10-CM | POA: Diagnosis not present

## 2019-08-17 DIAGNOSIS — E1169 Type 2 diabetes mellitus with other specified complication: Secondary | ICD-10-CM

## 2019-08-17 DIAGNOSIS — N2889 Other specified disorders of kidney and ureter: Secondary | ICD-10-CM

## 2019-08-17 DIAGNOSIS — N186 End stage renal disease: Secondary | ICD-10-CM | POA: Diagnosis not present

## 2019-08-17 LAB — BAYER DCA HB A1C WAIVED: HB A1C (BAYER DCA - WAIVED): 7.2 % — ABNORMAL HIGH (ref <7.0)

## 2019-08-17 MED ORDER — AMLODIPINE BESYLATE 5 MG PO TABS: 5.0000 mg | ORAL_TABLET | Freq: Every day | ORAL | 1 refills | Status: DC

## 2019-08-17 MED ORDER — ATORVASTATIN CALCIUM 40 MG PO TABS: 40.0000 mg | ORAL_TABLET | Freq: Every day | ORAL | 3 refills | Status: DC

## 2019-08-17 MED ORDER — METOPROLOL SUCCINATE ER 100 MG PO TB24: ORAL_TABLET | ORAL | 1 refills | Status: DC

## 2019-08-17 MED ORDER — BLOOD GLUCOSE MONITOR KIT: PACK | 0 refills | Status: DC

## 2019-08-17 MED ORDER — MUPIROCIN 2 % EX OINT: TOPICAL_OINTMENT | CUTANEOUS | 1 refills | Status: AC

## 2019-08-17 MED ORDER — TRULICITY 1.5 MG/0.5ML ~~LOC~~ SOAJ: SUBCUTANEOUS | 12 refills | Status: DC

## 2019-08-17 MED ORDER — TOUJEO SOLOSTAR 300 UNIT/ML ~~LOC~~ SOPN: 100.0000 [IU] | PEN_INJECTOR | Freq: Every day | SUBCUTANEOUS | 2 refills | Status: DC

## 2019-08-17 NOTE — Progress Notes (Signed)
Subjective:  Patient ID: Christy Zhang, female    DOB: [birth year]  Age: 64 y.o. MRN: [id number]  CC: Follow-up   HPI Christy Zhang [name] presents forFollow-up of diabetes. Patient not checking blood sugar at home.  Patient denies symptoms such as polyuria, polydipsia, excessive hunger, nausea No significant hypoglycemic spells noted. Medications reviewed. Pt reports taking them regularly without complication/adverse reaction being reported today.  Patient is still followed closely by nephrology for her end-stage renal disease and dialysis 3 times weekly.  She sometimes forgets to take her Lantus after her dialysis although she has been counseled to take it then due to dialysis that drawing out the insulin. History Christy Zhang has a past medical history of Anemia, Arthritis, Asthma, CHF (congestive heart failure) ([place]), Chronic kidney disease, Diabetes mellitus with complication ([place]), End stage renal disease ([place]), End stage renal disease on dialysis ([place]), Hyperlipidemia, Hypertension, and Neuromuscular disorder ([place]).   She has a past surgical history that includes Insertion of dialysis catheter; Tubal ligation; AV fistula placement (Right, 08/25/2014); Fistula superficialization (Right, 11/03/2014); Ligation of competing branches of arteriovenous fistula (Right, 11/03/2014); Fistulogram (Right, 01/16/2016); A/V Fistulagram (Right, 02/04/2017); PERIPHERAL VASCULAR BALLOON ANGIOPLASTY (Right, 02/04/2017); IR THROMBECTOMY AV FISTULA/W THROMBOLYSIS/PTA INC SHUNT/IMG RIGHT (Right, 12/03/2018); IR US Guide Vasc Access Right (12/03/2018); IR Fluoro Guide CV Line Right (12/03/2018); IR US Guide Vasc Access Right (12/03/2018); Bascilic vein transposition (Right, 12/24/2018); AV fistula placement (Right, 03/18/2019); and IR Removal Tun Cv Cath W/O FL (07/06/2019).   Her family history includes Cancer in her father; Diabetes in her daughter, father, mother, and son; Heart disease in her daughter and son;  Hypertension in her daughter, mother, sister, and son; Peripheral vascular disease in her son.She reports that she quit smoking about 35 years ago. Her smoking use included cigarettes. She has never used smokeless tobacco. She reports that she does not drink alcohol or use drugs.  Current Outpatient Medications on File Prior to Visit  Medication Sig Dispense Refill  . acetaminophen (TYLENOL) 325 MG tablet Take 650 mg by mouth every 6 (six) hours as needed for moderate pain or headache.    . diphenhydrAMINE (BENADRYL) 25 mg capsule Take 25 mg by mouth every 8 (eight) hours as needed for allergies or sleep.     . fluocinonide-emollient (LIDEX-E) 0.05 % cream Apply 1 application topically 2 (two) times daily. To affected areas (Patient taking differently: Apply 1 application topically 2 (two) times daily as needed (skin spots). To affected areas) 60 g 5  . glucose blood test strip Use to test blood sugars QID 100 each 12  . Lancets Misc. KIT 1 each by Does not apply route 4 (four) times daily as needed. 120 each 11  . multivitamin (RENA-VIT) TABS tablet Take 1 tablet by mouth daily.    . sevelamer carbonate (RENVELA) 800 MG tablet Take 1,600-2,400 mg by mouth See admin instructions. Take 2400 mg with each meal and 1600 mg with each snack     No current facility-administered medications on file prior to visit.    ROS Review of Systems  Constitutional: Negative.   HENT: Negative for congestion.   Eyes: Negative for visual disturbance.  Respiratory: Negative for shortness of breath.   Cardiovascular: Negative for chest pain.  Gastrointestinal: Negative for abdominal pain, constipation, diarrhea, nausea and vomiting.  Genitourinary: Negative for difficulty urinating.  Musculoskeletal: Negative for arthralgias and myalgias.  Neurological: Negative for headaches.  Psychiatric/Behavioral: Negative for sleep disturbance.    Objective:  BP 139/71  Pulse 73   Temp 97.8 F (36.6 C) (Temporal)    Ht _0  (1.6 m)   Wt 247 lb 12.8 oz (112.4 kg)   LMP  (LMP Unknown)   BMI 43.90 kg/m   BP Readings from Last 3 Encounters:  08/17/19 139/71  08/12/19 (!) 175/79  05/20/19 (!) 155/73    Wt Readings from Last 3 Encounters:  08/17/19 247 lb 12.8 oz (112.4 kg)  08/12/19 250 lb (113.4 kg)  05/20/19 250 lb (113.4 kg)     Physical Exam Constitutional:      General: She is not in acute distress.    Appearance: She is well-developed.  HENT:     Head: Normocephalic and atraumatic.  Eyes:     Conjunctiva/sclera: Conjunctivae normal.     Pupils: Pupils are equal, round, and reactive to light.  Neck:     Thyroid: No thyromegaly.  Cardiovascular:     Rate and Rhythm: Normal rate and regular rhythm.     Heart sounds: Normal heart sounds. No murmur.  Pulmonary:     Effort: Pulmonary effort is normal. No respiratory distress.     Breath sounds: Normal breath sounds. No wheezing or rales.  Abdominal:     General: Bowel sounds are normal. There is no distension.     Palpations: Abdomen is soft.     Tenderness: There is no abdominal tenderness.  Musculoskeletal:        General: Swelling present. Normal range of motion.     Cervical back: Normal range of motion and neck supple.  Lymphadenopathy:     Cervical: No cervical adenopathy.  Skin:    General: Skin is warm and dry.  Neurological:     Mental Status: She is alert and oriented to person, place, and time.  Psychiatric:        Behavior: Behavior normal.        Thought Content: Thought content normal.        Judgment: Judgment normal.       Assessment & Plan:   Christy Zhang was seen today for follow-up.  Diagnoses and all orders for this visit:  Controlled type 2 diabetes mellitus with chronic kidney disease on chronic dialysis, with long-term current use of insulin ([place]) -     CMP14+EGFR -     CBC with Differential/Platelet -     Bayer DCA Hb A1c Waived -     Microalbumin / creatinine urine ratio -     Discontinue:  blood glucose meter kit and supplies KIT; Dispense based on patient and insurance preference. Use up to four times daily as directed. (FOR ICD-9 250.00, 250.01). -     Dulaglutide (TRULICITY) 1.5 DJ/2.4QA SOPN; Inject content of one pen under the skin weekly on Saturday -     insulin glargine, 1 Unit Dial, (TOUJEO SOLOSTAR) 300 UNIT/ML Solostar Pen; Inject 100 Units into the skin daily.  End stage renal disease on dialysis ([place]) -     CMP14+EGFR -     CBC with Differential/Platelet  Hypertension secondary to other renal disorders -     CMP14+EGFR -     CBC with Differential/Platelet -     Bayer DCA Hb A1c Waived -     amLODipine (NORVASC) 5 MG tablet; Take 1 tablet (5 mg total) by mouth at bedtime. -     metoprolol succinate (TOPROL-XL) 100 MG 24 hr tablet; TAKE ONE TABLET BY MOUTH DAILY. TAKE WITH OR IMMEDIATELY FOLLOWING A MEAL.  Hyperlipidemia associated with type  2 diabetes mellitus ([place]) -     CMP14+EGFR -     CBC with Differential/Platelet -     Bayer DCA Hb A1c Waived -     atorvastatin (LIPITOR) 40 MG tablet; Take 1 tablet (40 mg total) by mouth daily.  Other orders -     mupirocin ointment (BACTROBAN) 2 %; Apply and cover with bandage twice daily      I have discontinued Bindu B. Courville's Blood Glucose Monitor System, blood glucose meter kit and supplies, and blood glucose meter kit and supplies. I have also changed her amLODipine and Toujeo SoloStar. Additionally, I am having her start on mupirocin ointment. Lastly, I am having her maintain her diphenhydrAMINE, sevelamer carbonate, Lancets Misc., glucose blood, fluocinonide-emollient, acetaminophen, multivitamin, atorvastatin, Trulicity, and metoprolol succinate.  Meds ordered this encounter  Medications  . DISCONTD: blood glucose meter kit and supplies KIT    Sig: Dispense based on patient and insurance preference. Use up to four times daily as directed. (FOR ICD-9 250.00, 250.01).    Dispense:  1 each    Refill:  0      Order Specific Question:   Number of strips    Answer:   100    Order Specific Question:   Number of lancets    Answer:   100  . mupirocin ointment (BACTROBAN) 2 %    Sig: Apply and cover with bandage twice daily    Dispense:  30 g    Refill:  1  . amLODipine (NORVASC) 5 MG tablet    Sig: Take 1 tablet (5 mg total) by mouth at bedtime.    Dispense:  90 tablet    Refill:  1  . atorvastatin (LIPITOR) 40 MG tablet    Sig: Take 1 tablet (40 mg total) by mouth daily.    Dispense:  90 tablet    Refill:  3  . Dulaglutide (TRULICITY) 1.5 CK/5.2BL SOPN    Sig: Inject content of one pen under the skin weekly on Saturday    Dispense:  4 pen    Refill:  12  . insulin glargine, 1 Unit Dial, (TOUJEO SOLOSTAR) 300 UNIT/ML Solostar Pen    Sig: Inject 100 Units into the skin daily.    Dispense:  10 mL    Refill:  2  . metoprolol succinate (TOPROL-XL) 100 MG 24 hr tablet    Sig: TAKE ONE TABLET BY MOUTH DAILY. TAKE WITH OR IMMEDIATELY FOLLOWING A MEAL.    Dispense:  90 tablet    Refill:  1     Follow-up: Return in about 3 months (around 11/16/2019).  Claretta Fraise, M.D.

## 2019-08-18 ENCOUNTER — Telehealth: Payer: Self-pay | Admitting: Family Medicine

## 2019-08-18 DIAGNOSIS — Z992 Dependence on renal dialysis: Secondary | ICD-10-CM | POA: Diagnosis not present

## 2019-08-18 DIAGNOSIS — N186 End stage renal disease: Secondary | ICD-10-CM | POA: Diagnosis not present

## 2019-08-18 DIAGNOSIS — N2581 Secondary hyperparathyroidism of renal origin: Secondary | ICD-10-CM | POA: Diagnosis not present

## 2019-08-18 DIAGNOSIS — D509 Iron deficiency anemia, unspecified: Secondary | ICD-10-CM | POA: Diagnosis not present

## 2019-08-18 DIAGNOSIS — D631 Anemia in chronic kidney disease: Secondary | ICD-10-CM | POA: Diagnosis not present

## 2019-08-18 LAB — CBC WITH DIFFERENTIAL/PLATELET
Basophils Absolute: 0.1 10*3/uL (ref 0.0–0.2)
Basos: 1 %
EOS (ABSOLUTE): 0.5 10*3/uL — ABNORMAL HIGH (ref 0.0–0.4)
Eos: 4 %
Hematocrit: 35.6 % (ref 34.0–46.6)
Hemoglobin: 11.8 g/dL (ref 11.1–15.9)
Immature Grans (Abs): 0 10*3/uL (ref 0.0–0.1)
Immature Granulocytes: 0 %
Lymphocytes Absolute: 2.5 10*3/uL (ref 0.7–3.1)
Lymphs: 20 %
MCH: 28.7 pg (ref 26.6–33.0)
MCHC: 33.1 g/dL (ref 31.5–35.7)
MCV: 87 fL (ref 79–97)
Monocytes Absolute: 0.8 10*3/uL (ref 0.1–0.9)
Monocytes: 6 %
Neutrophils Absolute: 8.5 10*3/uL — ABNORMAL HIGH (ref 1.4–7.0)
Neutrophils: 69 %
Platelets: 424 10*3/uL (ref 150–450)
RBC: 4.11 x10E6/uL (ref 3.77–5.28)
RDW: 13.7 % (ref 11.7–15.4)
WBC: 12.4 10*3/uL — ABNORMAL HIGH (ref 3.4–10.8)

## 2019-08-18 LAB — CMP14+EGFR
ALT: 7 IU/L (ref 0–32)
AST: 7 IU/L (ref 0–40)
Albumin/Globulin Ratio: 1.1 — ABNORMAL LOW (ref 1.2–2.2)
Albumin: 4.2 g/dL (ref 3.8–4.8)
Alkaline Phosphatase: 75 IU/L (ref 39–117)
BUN/Creatinine Ratio: 4 — ABNORMAL LOW (ref 12–28)
BUN: 28 mg/dL — ABNORMAL HIGH (ref 8–27)
Bilirubin Total: 0.5 mg/dL (ref 0.0–1.2)
CO2: 24 mmol/L (ref 20–29)
Calcium: 9.5 mg/dL (ref 8.7–10.3)
Chloride: 96 mmol/L (ref 96–106)
Creatinine, Ser: 7.38 mg/dL (ref 0.57–1.00)
GFR calc Af Amer: 6 mL/min/{1.73_m2} — ABNORMAL LOW (ref >59)
GFR calc non Af Amer: 5 mL/min/{1.73_m2} — ABNORMAL LOW (ref >59)
Globulin, Total: 3.7 g/dL (ref 1.5–4.5)
Glucose: 153 mg/dL — ABNORMAL HIGH (ref 65–99)
Potassium: 4.8 mmol/L (ref 3.5–5.2)
Sodium: 139 mmol/L (ref 134–144)
Total Protein: 7.9 g/dL (ref 6.0–8.5)

## 2019-08-18 NOTE — Chronic Care Management (AMB) (Signed)
  Chronic Care Management   Note  08/18/2019 Name: [name] [facility] MRN: [id number] DOB: Oct 08, 1955  Christy Zhang is a 64 y.o. year old female who is a primary care patient of [name], [name], MD. I reached out to [place] by phone today in response to a referral sent by Ms. Laroy Apple Seats's health plan.     Ms. Pal was given information about Chronic Care Management services today including:  1. CCM service includes personalized support from designated clinical staff supervised by her physician, including individualized plan of care and coordination with other care providers 2. 24/7 contact phone numbers for assistance for urgent and routine care needs. 3. Service will only be billed when office clinical staff spend 20 minutes or more in a month to coordinate care. 4. Only one practitioner may furnish and bill the service in a calendar month. 5. The patient may stop CCM services at any time (effective at the end of the month) by phone call to the office staff. 6. The patient will be responsible for cost sharing (co-pay) of up to 20% of the service fee (after annual deductible is met).  Patient agreed to services and verbal consent obtained.   Follow up plan: Telephone appointment with care management team member scheduled for: 10/12/2019  [facility]  [place], [place] [address] Direct Dial: [email or web address] Website: [email or web address]

## 2019-08-18 NOTE — Chronic Care Management (AMB) (Signed)
  Chronic Care Management   Outreach Note  08/18/2019 Name: [name] [facility] MRN: [id number] DOB: 1955/05/18  Christy Zhang is a 64 y.o. year old female who is a primary care patient of [name], [name], MD. I reached out to [place] by phone today in response to a referral sent by Ms. Laroy Apple Saiz's health plan.     An unsuccessful telephone outreach was attempted today. The patient was referred to the case management team for assistance with care management and care coordination.   Follow Up Plan: A HIPPA compliant phone message was left for the patient providing contact information and requesting a return call. The care management team will reach out to the patient again over the next 7 days. If patient returns call to provider office, please advise to call [place] at [phone].  [place], [place] [address] Direct Dial: [phone] [email or web address] Website: [email or web address]

## 2019-08-19 DIAGNOSIS — B351 Tinea unguium: Secondary | ICD-10-CM | POA: Diagnosis not present

## 2019-08-19 DIAGNOSIS — E1151 Type 2 diabetes mellitus with diabetic peripheral angiopathy without gangrene: Secondary | ICD-10-CM | POA: Diagnosis not present

## 2019-08-19 DIAGNOSIS — M79672 Pain in left foot: Secondary | ICD-10-CM | POA: Diagnosis not present

## 2019-08-19 DIAGNOSIS — M79671 Pain in right foot: Secondary | ICD-10-CM | POA: Diagnosis not present

## 2019-08-20 DIAGNOSIS — D509 Iron deficiency anemia, unspecified: Secondary | ICD-10-CM | POA: Diagnosis not present

## 2019-08-20 DIAGNOSIS — D631 Anemia in chronic kidney disease: Secondary | ICD-10-CM | POA: Diagnosis not present

## 2019-08-20 DIAGNOSIS — N186 End stage renal disease: Secondary | ICD-10-CM | POA: Diagnosis not present

## 2019-08-20 DIAGNOSIS — N2581 Secondary hyperparathyroidism of renal origin: Secondary | ICD-10-CM | POA: Diagnosis not present

## 2019-08-20 DIAGNOSIS — Z992 Dependence on renal dialysis: Secondary | ICD-10-CM | POA: Diagnosis not present

## 2019-08-23 DIAGNOSIS — D509 Iron deficiency anemia, unspecified: Secondary | ICD-10-CM | POA: Diagnosis not present

## 2019-08-23 DIAGNOSIS — N186 End stage renal disease: Secondary | ICD-10-CM | POA: Diagnosis not present

## 2019-08-23 DIAGNOSIS — D631 Anemia in chronic kidney disease: Secondary | ICD-10-CM | POA: Diagnosis not present

## 2019-08-23 DIAGNOSIS — Z992 Dependence on renal dialysis: Secondary | ICD-10-CM | POA: Diagnosis not present

## 2019-08-23 DIAGNOSIS — N2581 Secondary hyperparathyroidism of renal origin: Secondary | ICD-10-CM | POA: Diagnosis not present

## 2019-08-24 ENCOUNTER — Ambulatory Visit: Payer: Medicare Other

## 2019-08-25 DIAGNOSIS — D509 Iron deficiency anemia, unspecified: Secondary | ICD-10-CM | POA: Diagnosis not present

## 2019-08-25 DIAGNOSIS — Z992 Dependence on renal dialysis: Secondary | ICD-10-CM | POA: Diagnosis not present

## 2019-08-25 DIAGNOSIS — N186 End stage renal disease: Secondary | ICD-10-CM | POA: Diagnosis not present

## 2019-08-25 DIAGNOSIS — N2581 Secondary hyperparathyroidism of renal origin: Secondary | ICD-10-CM | POA: Diagnosis not present

## 2019-08-25 DIAGNOSIS — D631 Anemia in chronic kidney disease: Secondary | ICD-10-CM | POA: Diagnosis not present

## 2019-08-27 DIAGNOSIS — Z992 Dependence on renal dialysis: Secondary | ICD-10-CM | POA: Diagnosis not present

## 2019-08-27 DIAGNOSIS — N186 End stage renal disease: Secondary | ICD-10-CM | POA: Diagnosis not present

## 2019-08-27 DIAGNOSIS — D509 Iron deficiency anemia, unspecified: Secondary | ICD-10-CM | POA: Diagnosis not present

## 2019-08-27 DIAGNOSIS — N2581 Secondary hyperparathyroidism of renal origin: Secondary | ICD-10-CM | POA: Diagnosis not present

## 2019-08-27 DIAGNOSIS — D631 Anemia in chronic kidney disease: Secondary | ICD-10-CM | POA: Diagnosis not present

## 2019-08-30 DIAGNOSIS — D509 Iron deficiency anemia, unspecified: Secondary | ICD-10-CM | POA: Diagnosis not present

## 2019-08-30 DIAGNOSIS — D631 Anemia in chronic kidney disease: Secondary | ICD-10-CM | POA: Diagnosis not present

## 2019-08-30 DIAGNOSIS — N186 End stage renal disease: Secondary | ICD-10-CM | POA: Diagnosis not present

## 2019-08-30 DIAGNOSIS — Z992 Dependence on renal dialysis: Secondary | ICD-10-CM | POA: Diagnosis not present

## 2019-08-30 DIAGNOSIS — N2581 Secondary hyperparathyroidism of renal origin: Secondary | ICD-10-CM | POA: Diagnosis not present

## 2019-09-01 DIAGNOSIS — Z992 Dependence on renal dialysis: Secondary | ICD-10-CM | POA: Diagnosis not present

## 2019-09-01 DIAGNOSIS — D509 Iron deficiency anemia, unspecified: Secondary | ICD-10-CM | POA: Diagnosis not present

## 2019-09-01 DIAGNOSIS — N186 End stage renal disease: Secondary | ICD-10-CM | POA: Diagnosis not present

## 2019-09-01 DIAGNOSIS — D631 Anemia in chronic kidney disease: Secondary | ICD-10-CM | POA: Diagnosis not present

## 2019-09-01 DIAGNOSIS — N2581 Secondary hyperparathyroidism of renal origin: Secondary | ICD-10-CM | POA: Diagnosis not present

## 2019-09-02 ENCOUNTER — Ambulatory Visit (INDEPENDENT_AMBULATORY_CARE_PROVIDER_SITE_OTHER): Payer: Medicare Other | Admitting: Vascular Surgery

## 2019-09-02 ENCOUNTER — Ambulatory Visit (HOSPITAL_COMMUNITY)
Admission: RE | Admit: 2019-09-02 | Discharge: 2019-09-02 | Disposition: A | Discharge status: Home / Self Care | Payer: Medicare Other | Source: Ambulatory Visit | Attending: Vascular Surgery | Admitting: Vascular Surgery

## 2019-09-02 ENCOUNTER — Other Ambulatory Visit: Payer: Self-pay

## 2019-09-02 DIAGNOSIS — I739 Peripheral vascular disease, unspecified: Secondary | ICD-10-CM

## 2019-09-02 NOTE — Progress Notes (Signed)
Patient name: Christy Zhang MRN: [id number] DOB: 1955-05-28 Sex: female   I connected with [place] on 09/02/2019 using the telephone and verified that I was speaking with the correct person using two identifiers. Patient was located at home and accompanied by no one. I am located at our office on [facility].   The limitations of evaluation and management by telemedicine and the availability of in person appointments have been previously discussed with the patient and are documented in the patients chart. The patient expressed understanding and consented to proceed.  PCP: Claretta Fraise, MD Referring MD: Dr Caryl Comes [facility]   HPI: Christy Zhang [facility] is a 64 y.o. female, seen a few weeks ago for recent ulceration on her toe as well as darkish discoloration of the toe.  She recently had duplex ultrasound for further evaluation of this.  She had calcified vessels.  She did have a toe pressure of 95 on that foot.  She states that she no longer has an ulceration on the toe.  She states the toe is still dark in color but this is skin pigment only.  She did not have changes of gangrene on her prior exam.  She does not have rest pain.  I did speak with Dr. Caryl Comes her podiatrist by phone today and he is in agreement that we will continue to observe this for now.  Of note she also recently had some difficulty with cannulation of her dialysis access.  I discussed with her today that if she has continued problems to make an appointment and we would further evaluate this.  Other medical problems include   Past Medical History:  Diagnosis Date  . Anemia   . Arthritis   . Asthma    in the past  . CHF (congestive heart failure) ([place])   . Chronic kidney disease    Renal failure, dialysis on M/W/F  . 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 End stage renal disease ([place])   . End stage renal disease on dialysis ([place])   . Hyperlipidemia   . Hypertension    . Neuromuscular disorder [facility])    Past Surgical History:  Procedure Laterality Date  . A/V FISTULAGRAM Right 02/04/2017   Procedure: A/V Fistulagram;  Surgeon: Waynetta Sandy, MD;  Location: [place] CV [facility];  Service: Cardiovascular;  Laterality: Right;  . AV FISTULA PLACEMENT Right 08/25/2014   Procedure: RIGHT BRACHIOCEPHALIC ARTERIOVENOUS (AV) FISTULA CREATION;  Surgeon: Mal Misty, MD;  Location: [facility] OR;  Service: Vascular;  Laterality: Right;  . AV FISTULA PLACEMENT Right 03/18/2019   Procedure: INSERTION OF ARTERIOVENOUS (AV) GORE-TEX GRAFT RIGHT ARM;  Surgeon: Serafina Mitchell, MD;  Location: [place];  Service: Vascular;  Laterality: Right;  . BASCILIC VEIN TRANSPOSITION Right 12/24/2018   Procedure: FIRST STAGE BASILIC VEIN TRANSPOSITION RIGHT UPPER ARM;  Surgeon: Serafina Mitchell, MD;  Location: [place];  Service: Vascular;  Laterality: Right;  . FISTULA SUPERFICIALIZATION Right 11/03/2014   Procedure: RIGHT UPPER ARM FISTULA SUPERFICIALIZATION;  Surgeon: Mal Misty, MD;  Location: [place];  Service: Vascular;  Laterality: Right;  . FISTULOGRAM Right 01/16/2016   Procedure: RIGHT UPPER EXTREMITY ARTERIOVENOUS FISTULOGRAM WITH BALLOON ANGIOPLASTY;  Surgeon: Vickie Epley, MD;  [facility];  Service: Vascular;  Laterality: Right;  . INSERTION OF DIALYSIS CATHETER     X4  . IR FLUORO GUIDE CV LINE RIGHT  12/03/2018  . IR REMOVAL TUN CV  CATH W/O FL  07/06/2019  . IR THROMBECTOMY AV FISTULA W/THROMBOLYSIS/PTA INC/SHUNT/IMG RIGHT Right 12/03/2018  . IR US GUIDE VASC ACCESS RIGHT  12/03/2018  . IR US GUIDE VASC ACCESS RIGHT  12/03/2018  . LIGATION OF COMPETING BRANCHES OF ARTERIOVENOUS FISTULA Right 11/03/2014   Procedure: LIGATION OF COMPETING BRANCHE x1 OF RIGHT UPPER ARM ARTERIOVENOUS FISTULA;  Surgeon: Mal Misty, MD;  Location: [place];  Service: Vascular;  Laterality: Right;  . PERIPHERAL VASCULAR BALLOON ANGIOPLASTY Right 02/04/2017   Procedure: PERIPHERAL VASCULAR BALLOON  ANGIOPLASTY;  Surgeon: Waynetta Sandy, MD;  Location: [place] CV LAB;  Service: Cardiovascular;  Laterality: Right;  . TUBAL LIGATION      Family History  Problem Relation Age of Onset  . Diabetes Mother   . Hypertension Mother   . Cancer Father   . Diabetes Father   . Hypertension Sister   . Diabetes Daughter   . Heart disease Daughter   . Hypertension Daughter   . Diabetes Son   . Heart disease Son   . Hypertension Son   . Peripheral vascular disease Son        amputation    SOCIAL HISTORY: Social History   Socioeconomic History  . Marital status: Married    Spouse name: Not on file  . Number of children: Not on file  . Years of education: Not on file  . Highest education level: Not on file  Occupational History  . Not on file  Tobacco Use  . Smoking status: Former Smoker    Types: Cigarettes    Quit date: 07/25/1984    Years since quitting: 35.1  . Smokeless tobacco: Never Used  Substance and Sexual Activity  . Alcohol use: No    Alcohol/week: 0.0 standard drinks  . Drug use: No  . Sexual activity: Not on file  Other Topics Concern  . Not on file  Social History Narrative  . Not on file   Social Determinants of Health   Financial Resource Strain:   . Difficulty of Paying Living Expenses:   Food Insecurity:   . Worried About Charity fundraiser in the Last Year:   . Arboriculturist in the Last Year:   Transportation Needs:   . Film/video editor (Medical):   Marland Kitchen Lack of Transportation (Non-Medical):   Physical Activity:   . Days of Exercise per Week:   . Minutes of Exercise per Session:   Stress:   . Feeling of Stress :   Social Connections:   . Frequency of Communication with Friends and Family:   . Frequency of Social Gatherings with Friends and Family:   . Attends Religious Services:   . Active Member of Clubs or Organizations:   . Attends Archivist Meetings:   Marland Kitchen Marital Status:   Intimate Partner Violence:   .  Fear of Current or Ex-Partner:   . Emotionally Abused:   Marland Kitchen Physically Abused:   . Sexually Abused:     Allergies  Allergen Reactions  . Sulfa Antibiotics Swelling    Current Outpatient Medications  Medication Sig Dispense Refill  . acetaminophen (TYLENOL) 325 MG tablet Take 650 mg by mouth every 6 (six) hours as needed for moderate pain or headache.    Marland Kitchen amLODipine (NORVASC) 5 MG tablet Take 1 tablet (5 mg total) by mouth at bedtime. 90 tablet 1  . atorvastatin (LIPITOR) 40 MG tablet Take 1 tablet (40 mg total) by mouth daily. 90 tablet 3  .  diphenhydrAMINE (BENADRYL) 25 mg capsule Take 25 mg by mouth every 8 (eight) hours as needed for allergies or sleep.     . Dulaglutide (TRULICITY) 1.5 IW/8.0HO SOPN Inject content of one pen under the skin weekly on Saturday 4 pen 12  . fluocinonide-emollient (LIDEX-E) 0.05 % cream Apply 1 application topically 2 (two) times daily. To affected areas (Patient taking differently: Apply 1 application topically 2 (two) times daily as needed (skin spots). To affected areas) 60 g 5  . glucose blood test strip Use to test blood sugars QID 100 each 12  . insulin glargine, 1 Unit Dial, (TOUJEO SOLOSTAR) 300 UNIT/ML Solostar Pen Inject 100 Units into the skin daily. 10 mL 2  . Lancets Misc. KIT 1 each by Does not apply route 4 (four) times daily as needed. 120 each 11  . metoprolol succinate (TOPROL-XL) 100 MG 24 hr tablet TAKE ONE TABLET BY MOUTH DAILY. TAKE WITH OR IMMEDIATELY FOLLOWING A MEAL. 90 tablet 1  . multivitamin (RENA-VIT) TABS tablet Take 1 tablet by mouth daily.    . mupirocin ointment (BACTROBAN) 2 % Apply and cover with bandage twice daily 30 g 1  . sevelamer carbonate (RENVELA) 800 MG tablet Take 1,600-2,400 mg by mouth See admin instructions. Take 2400 mg with each meal and 1600 mg with each snack     No current facility-administered medications for this visit.    ROS:   General:  No weight loss, Fever, chills.  Cardiac: No recent  episodes of chest pain/pressure, no shortness of breath at rest.  No shortness of breath with exertion.  Denies history of atrial fibrillation or irregular heartbeat  Pulmonary: No home oxygen, no productive cough, no hemoptysis,  No asthma or wheezing   Physical Examination  DATA:  Patient had a lower extremity duplex exam today which showed triphasic flow to the popliteal level.  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  She had absent posterior tibial and peroneal arteries.  Her previous ABIs were calcified noncompressible.  However she did have a toe pressure of 95 in the right leg previously.  ASSESSMENT: Tibial occlusive disease right foot but currently no active ongoing wound or rest pain.  She will be at high risk for having difficulty healing wounds if she develops an ulcer in the future.  She may have limited options as far as an endovascular option or open option as this is mainly tibial disease.  I do not believe that she warrants arteriogram at this point since she has very mild symptoms and arteriogram is going to be high risk due to her obesity.   PLAN: Follow-up duplex exam and bilateral toe pressures in 3 months.  She will see one of our APP is at that visit.  If she develops a new wound on her foot or has deterioration of the appearance of her foot neck step would be an arteriogram.  Ruta Hinds, MD [facility] [place]: [phone] Pager: [phone]   I spent 10 minutes with the patient via telephone encounter.

## 2019-09-03 DIAGNOSIS — Z992 Dependence on renal dialysis: Secondary | ICD-10-CM | POA: Diagnosis not present

## 2019-09-03 DIAGNOSIS — D631 Anemia in chronic kidney disease: Secondary | ICD-10-CM | POA: Diagnosis not present

## 2019-09-03 DIAGNOSIS — N2581 Secondary hyperparathyroidism of renal origin: Secondary | ICD-10-CM | POA: Diagnosis not present

## 2019-09-03 DIAGNOSIS — D509 Iron deficiency anemia, unspecified: Secondary | ICD-10-CM | POA: Diagnosis not present

## 2019-09-03 DIAGNOSIS — N186 End stage renal disease: Secondary | ICD-10-CM | POA: Diagnosis not present

## 2019-09-06 ENCOUNTER — Other Ambulatory Visit: Payer: Self-pay | Admitting: *Deleted

## 2019-09-06 DIAGNOSIS — D509 Iron deficiency anemia, unspecified: Secondary | ICD-10-CM | POA: Diagnosis not present

## 2019-09-06 DIAGNOSIS — I739 Peripheral vascular disease, unspecified: Secondary | ICD-10-CM

## 2019-09-06 DIAGNOSIS — N2581 Secondary hyperparathyroidism of renal origin: Secondary | ICD-10-CM | POA: Diagnosis not present

## 2019-09-06 DIAGNOSIS — N186 End stage renal disease: Secondary | ICD-10-CM | POA: Diagnosis not present

## 2019-09-06 DIAGNOSIS — Z992 Dependence on renal dialysis: Secondary | ICD-10-CM | POA: Diagnosis not present

## 2019-09-06 DIAGNOSIS — D631 Anemia in chronic kidney disease: Secondary | ICD-10-CM | POA: Diagnosis not present

## 2019-09-08 DIAGNOSIS — D509 Iron deficiency anemia, unspecified: Secondary | ICD-10-CM | POA: Diagnosis not present

## 2019-09-08 DIAGNOSIS — Z992 Dependence on renal dialysis: Secondary | ICD-10-CM | POA: Diagnosis not present

## 2019-09-08 DIAGNOSIS — N2581 Secondary hyperparathyroidism of renal origin: Secondary | ICD-10-CM | POA: Diagnosis not present

## 2019-09-08 DIAGNOSIS — D631 Anemia in chronic kidney disease: Secondary | ICD-10-CM | POA: Diagnosis not present

## 2019-09-08 DIAGNOSIS — N186 End stage renal disease: Secondary | ICD-10-CM | POA: Diagnosis not present

## 2019-09-10 DIAGNOSIS — Z992 Dependence on renal dialysis: Secondary | ICD-10-CM | POA: Diagnosis not present

## 2019-09-10 DIAGNOSIS — N186 End stage renal disease: Secondary | ICD-10-CM | POA: Diagnosis not present

## 2019-09-10 DIAGNOSIS — D509 Iron deficiency anemia, unspecified: Secondary | ICD-10-CM | POA: Diagnosis not present

## 2019-09-10 DIAGNOSIS — N2581 Secondary hyperparathyroidism of renal origin: Secondary | ICD-10-CM | POA: Diagnosis not present

## 2019-09-10 DIAGNOSIS — D631 Anemia in chronic kidney disease: Secondary | ICD-10-CM | POA: Diagnosis not present

## 2019-09-13 DIAGNOSIS — Z992 Dependence on renal dialysis: Secondary | ICD-10-CM | POA: Diagnosis not present

## 2019-09-13 DIAGNOSIS — N2581 Secondary hyperparathyroidism of renal origin: Secondary | ICD-10-CM | POA: Diagnosis not present

## 2019-09-13 DIAGNOSIS — D631 Anemia in chronic kidney disease: Secondary | ICD-10-CM | POA: Diagnosis not present

## 2019-09-13 DIAGNOSIS — D509 Iron deficiency anemia, unspecified: Secondary | ICD-10-CM | POA: Diagnosis not present

## 2019-09-13 DIAGNOSIS — N186 End stage renal disease: Secondary | ICD-10-CM | POA: Diagnosis not present

## 2019-09-15 DIAGNOSIS — N2581 Secondary hyperparathyroidism of renal origin: Secondary | ICD-10-CM | POA: Diagnosis not present

## 2019-09-15 DIAGNOSIS — Z992 Dependence on renal dialysis: Secondary | ICD-10-CM | POA: Diagnosis not present

## 2019-09-15 DIAGNOSIS — D631 Anemia in chronic kidney disease: Secondary | ICD-10-CM | POA: Diagnosis not present

## 2019-09-15 DIAGNOSIS — N186 End stage renal disease: Secondary | ICD-10-CM | POA: Diagnosis not present

## 2019-09-15 DIAGNOSIS — D509 Iron deficiency anemia, unspecified: Secondary | ICD-10-CM | POA: Diagnosis not present

## 2019-09-17 DIAGNOSIS — N2581 Secondary hyperparathyroidism of renal origin: Secondary | ICD-10-CM | POA: Diagnosis not present

## 2019-09-17 DIAGNOSIS — D509 Iron deficiency anemia, unspecified: Secondary | ICD-10-CM | POA: Diagnosis not present

## 2019-09-17 DIAGNOSIS — N186 End stage renal disease: Secondary | ICD-10-CM | POA: Diagnosis not present

## 2019-09-17 DIAGNOSIS — D631 Anemia in chronic kidney disease: Secondary | ICD-10-CM | POA: Diagnosis not present

## 2019-09-17 DIAGNOSIS — Z992 Dependence on renal dialysis: Secondary | ICD-10-CM | POA: Diagnosis not present

## 2019-09-20 DIAGNOSIS — N2581 Secondary hyperparathyroidism of renal origin: Secondary | ICD-10-CM | POA: Diagnosis not present

## 2019-09-20 DIAGNOSIS — N186 End stage renal disease: Secondary | ICD-10-CM | POA: Diagnosis not present

## 2019-09-20 DIAGNOSIS — Z992 Dependence on renal dialysis: Secondary | ICD-10-CM | POA: Diagnosis not present

## 2019-09-20 DIAGNOSIS — D631 Anemia in chronic kidney disease: Secondary | ICD-10-CM | POA: Diagnosis not present

## 2019-09-20 DIAGNOSIS — D509 Iron deficiency anemia, unspecified: Secondary | ICD-10-CM | POA: Diagnosis not present

## 2019-09-22 DIAGNOSIS — D631 Anemia in chronic kidney disease: Secondary | ICD-10-CM | POA: Diagnosis not present

## 2019-09-22 DIAGNOSIS — D509 Iron deficiency anemia, unspecified: Secondary | ICD-10-CM | POA: Diagnosis not present

## 2019-09-22 DIAGNOSIS — N186 End stage renal disease: Secondary | ICD-10-CM | POA: Diagnosis not present

## 2019-09-22 DIAGNOSIS — Z992 Dependence on renal dialysis: Secondary | ICD-10-CM | POA: Diagnosis not present

## 2019-09-22 DIAGNOSIS — N2581 Secondary hyperparathyroidism of renal origin: Secondary | ICD-10-CM | POA: Diagnosis not present

## 2019-09-24 DIAGNOSIS — D509 Iron deficiency anemia, unspecified: Secondary | ICD-10-CM | POA: Diagnosis not present

## 2019-09-24 DIAGNOSIS — N2581 Secondary hyperparathyroidism of renal origin: Secondary | ICD-10-CM | POA: Diagnosis not present

## 2019-09-24 DIAGNOSIS — N186 End stage renal disease: Secondary | ICD-10-CM | POA: Diagnosis not present

## 2019-09-24 DIAGNOSIS — Z992 Dependence on renal dialysis: Secondary | ICD-10-CM | POA: Diagnosis not present

## 2019-09-24 DIAGNOSIS — D631 Anemia in chronic kidney disease: Secondary | ICD-10-CM | POA: Diagnosis not present

## 2019-09-27 DIAGNOSIS — Z992 Dependence on renal dialysis: Secondary | ICD-10-CM | POA: Diagnosis not present

## 2019-09-27 DIAGNOSIS — N2581 Secondary hyperparathyroidism of renal origin: Secondary | ICD-10-CM | POA: Diagnosis not present

## 2019-09-27 DIAGNOSIS — D509 Iron deficiency anemia, unspecified: Secondary | ICD-10-CM | POA: Diagnosis not present

## 2019-09-27 DIAGNOSIS — N186 End stage renal disease: Secondary | ICD-10-CM | POA: Diagnosis not present

## 2019-09-27 DIAGNOSIS — D631 Anemia in chronic kidney disease: Secondary | ICD-10-CM | POA: Diagnosis not present

## 2019-09-30 ENCOUNTER — Telehealth: Payer: Self-pay | Admitting: *Deleted

## 2019-09-30 NOTE — Telephone Encounter (Signed)
[employer] had sent form for signature for Chux, pullups and a wheelchair.  LMOVM that order for the wheelchair would require a F2F visit with Dr. Livia Snellen either by video or in office.

## 2019-10-08 ENCOUNTER — Other Ambulatory Visit: Payer: Self-pay | Admitting: Student

## 2019-10-08 ENCOUNTER — Other Ambulatory Visit: Payer: Self-pay | Admitting: Physician Assistant

## 2019-10-08 ENCOUNTER — Other Ambulatory Visit (HOSPITAL_COMMUNITY): Payer: Self-pay | Admitting: Nephrology

## 2019-10-08 DIAGNOSIS — N186 End stage renal disease: Secondary | ICD-10-CM

## 2019-10-11 ENCOUNTER — Ambulatory Visit (HOSPITAL_COMMUNITY)
Admission: RE | Admit: 2019-10-11 | Discharge: 2019-10-11 | Disposition: A | Discharge status: Home / Self Care | Payer: Medicare Other | Source: Ambulatory Visit | Attending: Nephrology | Admitting: Nephrology

## 2019-10-11 ENCOUNTER — Other Ambulatory Visit (HOSPITAL_COMMUNITY): Payer: Self-pay | Admitting: Nephrology

## 2019-10-11 ENCOUNTER — Other Ambulatory Visit: Payer: Self-pay

## 2019-10-11 DIAGNOSIS — I132 Hypertensive heart and chronic kidney disease with heart failure and with stage 5 chronic kidney disease, or end stage renal disease: Secondary | ICD-10-CM | POA: Insufficient documentation

## 2019-10-11 DIAGNOSIS — Z992 Dependence on renal dialysis: Secondary | ICD-10-CM | POA: Insufficient documentation

## 2019-10-11 DIAGNOSIS — D649 Anemia, unspecified: Secondary | ICD-10-CM | POA: Insufficient documentation

## 2019-10-11 DIAGNOSIS — Z87891 Personal history of nicotine dependence: Secondary | ICD-10-CM | POA: Diagnosis not present

## 2019-10-11 DIAGNOSIS — Z794 Long term (current) use of insulin: Secondary | ICD-10-CM | POA: Insufficient documentation

## 2019-10-11 DIAGNOSIS — Y841 Kidney dialysis as the cause of abnormal reaction of the patient, or of later complication, without mention of misadventure at the time of the procedure: Secondary | ICD-10-CM | POA: Diagnosis not present

## 2019-10-11 DIAGNOSIS — Z79899 Other long term (current) drug therapy: Secondary | ICD-10-CM | POA: Insufficient documentation

## 2019-10-11 DIAGNOSIS — T82868A Thrombosis of vascular prosthetic devices, implants and grafts, initial encounter: Secondary | ICD-10-CM | POA: Insufficient documentation

## 2019-10-11 DIAGNOSIS — E1122 Type 2 diabetes mellitus with diabetic chronic kidney disease: Secondary | ICD-10-CM | POA: Insufficient documentation

## 2019-10-11 DIAGNOSIS — E785 Hyperlipidemia, unspecified: Secondary | ICD-10-CM | POA: Diagnosis not present

## 2019-10-11 DIAGNOSIS — I509 Heart failure, unspecified: Secondary | ICD-10-CM | POA: Diagnosis not present

## 2019-10-11 DIAGNOSIS — N186 End stage renal disease: Secondary | ICD-10-CM

## 2019-10-11 DIAGNOSIS — G709 Myoneural disorder, unspecified: Secondary | ICD-10-CM | POA: Insufficient documentation

## 2019-10-11 HISTORY — PX: IR THROMBECTOMY AV FISTULA W/THROMBOLYSIS/PTA/STENT INC/SHUNT/IMG RT: IMG6120

## 2019-10-11 LAB — GLUCOSE, CAPILLARY: Glucose-Capillary: 79 mg/dL (ref 70–99)

## 2019-10-11 MED ORDER — MIDAZOLAM HCL 2 MG/2ML IJ SOLN
INTRAMUSCULAR | Status: AC
  Filled 2019-10-11: qty 2

## 2019-10-11 MED ORDER — HEPARIN SODIUM (PORCINE) 1000 UNIT/ML IJ SOLN
INTRAMUSCULAR | Status: AC
  Filled 2019-10-11: qty 1

## 2019-10-11 MED ORDER — IOHEXOL 300 MG/ML  SOLN
50.0000 mL | Freq: Once | INTRAMUSCULAR | Status: AC | PRN
  Administered 2019-10-11: 5 mL via INTRAVENOUS

## 2019-10-11 MED ORDER — LIDOCAINE HCL 1 % IJ SOLN
INTRAMUSCULAR | Status: AC
  Filled 2019-10-11: qty 20

## 2019-10-11 MED ORDER — HEPARIN SODIUM (PORCINE) 1000 UNIT/ML IJ SOLN
INTRAMUSCULAR | Status: AC | PRN
  Administered 2019-10-11: 3000 [IU] via INTRAVENOUS
  Administered 2019-10-11: 1000 [IU] via INTRAVENOUS

## 2019-10-11 MED ORDER — ALTEPLASE 1 MG/ML SYRINGE FOR VASCULAR PROCEDURE
INTRAMUSCULAR | Status: AC | PRN
  Administered 2019-10-11 (×3): 1 mg via INTRA_ARTERIAL

## 2019-10-11 MED ORDER — FENTANYL CITRATE (PF) 100 MCG/2ML IJ SOLN
INTRAMUSCULAR | Status: AC | PRN
  Administered 2019-10-11 (×2): 25 ug via INTRAVENOUS
  Administered 2019-10-11: 50 ug via INTRAVENOUS
  Administered 2019-10-11: 25 ug via INTRAVENOUS
  Administered 2019-10-11: 50 ug via INTRAVENOUS

## 2019-10-11 MED ORDER — IOHEXOL 300 MG/ML  SOLN
50.0000 mL | Freq: Once | INTRAMUSCULAR | Status: AC | PRN
  Administered 2019-10-11: 15 mL via INTRAVENOUS

## 2019-10-11 MED ORDER — ALTEPLASE 2 MG IJ SOLR
INTRAMUSCULAR | Status: AC
  Filled 2019-10-11: qty 2

## 2019-10-11 MED ORDER — IOHEXOL 300 MG/ML  SOLN
100.0000 mL | Freq: Once | INTRAMUSCULAR | Status: AC | PRN
  Administered 2019-10-11: 70 mL via INTRAVENOUS

## 2019-10-11 MED ORDER — SODIUM CHLORIDE 0.9 % IV SOLN: INTRAVENOUS | Status: DC

## 2019-10-11 MED ORDER — FENTANYL CITRATE (PF) 100 MCG/2ML IJ SOLN
INTRAMUSCULAR | Status: AC
  Filled 2019-10-11: qty 2

## 2019-10-11 MED ORDER — MIDAZOLAM HCL 2 MG/2ML IJ SOLN
INTRAMUSCULAR | Status: AC | PRN
  Administered 2019-10-11 (×2): 1 mg via INTRAVENOUS
  Administered 2019-10-11: 0.5 mg via INTRAVENOUS

## 2019-10-11 NOTE — Sedation Documentation (Signed)
Patient responds to voice and appears to be sleeping.  This RN asked patient if she was having pain in the right arm where the fistulagram is being done.  Patient denies pain in the arm.  Patient states her left side and back are "hurting".

## 2019-10-11 NOTE — H&P (Signed)
Chief Complaint: Patient was seen in consultation today for right upper arm dialysis fistula declot at the request of [place]  Referring Physician(s): [place]  Supervising Physician: Aletta Edouard  Patient Status: [facility] - Out-pt  History of Present Illness: Christy Zhang is a 64 y.o. female   ESRD Last visit to IR 11/2018 for declot RUE fistula Unsuccessful and tunneled HD catheter was placed  New RUE fistula placed 12/24/18 (First stage right basilic vein fistula) and 03/18/19- Dr Trula Slade Tunneled cath removed in IR 07/06/19  Has ran well without issue since then Last dialysis was 10/06/19- no issues 10/08/19: clotted  Scheduled today for RUE fistula evaluation with possible intervention; possible tunneled dialysis catheter placement   Past Medical History:  Diagnosis Date  . Anemia   . Arthritis   . Asthma    in the past  . CHF (congestive heart failure) ([place])   . Chronic kidney disease    Renal failure, dialysis on M/W/F  . 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 End stage renal disease ([place])   . End stage renal disease on dialysis ([place])   . Hyperlipidemia   . Hypertension   . Neuromuscular disorder [facility])     Past Surgical History:  Procedure Laterality Date  . A/V FISTULAGRAM Right 02/04/2017   Procedure: A/V Fistulagram;  Surgeon: Waynetta Sandy, MD;  Location: [place] CV LAB;  Service: Cardiovascular;  Laterality: Right;  . AV FISTULA PLACEMENT Right 08/25/2014   Procedure: RIGHT BRACHIOCEPHALIC ARTERIOVENOUS (AV) FISTULA CREATION;  Surgeon: Mal Misty, MD;  Location: [facility] OR;  Service: Vascular;  Laterality: Right;  . AV FISTULA PLACEMENT Right 03/18/2019   Procedure: INSERTION OF ARTERIOVENOUS (AV) GORE-TEX GRAFT RIGHT ARM;  Surgeon: Serafina Mitchell, MD;  Location: [place];  Service: Vascular;  Laterality: Right;  . BASCILIC VEIN TRANSPOSITION Right 12/24/2018   Procedure: FIRST STAGE BASILIC  VEIN TRANSPOSITION RIGHT UPPER ARM;  Surgeon: Serafina Mitchell, MD;  Location: [place];  Service: Vascular;  Laterality: Right;  . FISTULA SUPERFICIALIZATION Right 11/03/2014   Procedure: RIGHT UPPER ARM FISTULA SUPERFICIALIZATION;  Surgeon: Mal Misty, MD;  Location: [place];  Service: Vascular;  Laterality: Right;  . FISTULOGRAM Right 01/16/2016   Procedure: RIGHT UPPER EXTREMITY ARTERIOVENOUS FISTULOGRAM WITH BALLOON ANGIOPLASTY;  Surgeon: Vickie Epley, MD;  [facility];  Service: Vascular;  Laterality: Right;  . INSERTION OF DIALYSIS CATHETER     X4  . IR FLUORO GUIDE CV LINE RIGHT  12/03/2018  . IR REMOVAL TUN CV CATH W/O FL  07/06/2019  . IR THROMBECTOMY AV FISTULA W/THROMBOLYSIS/PTA INC/SHUNT/IMG RIGHT Right 12/03/2018  . IR US GUIDE VASC ACCESS RIGHT  12/03/2018  . IR US GUIDE VASC ACCESS RIGHT  12/03/2018  . LIGATION OF COMPETING BRANCHES OF ARTERIOVENOUS FISTULA Right 11/03/2014   Procedure: LIGATION OF COMPETING BRANCHE x1 OF RIGHT UPPER ARM ARTERIOVENOUS FISTULA;  Surgeon: Mal Misty, MD;  Location: [place];  Service: Vascular;  Laterality: Right;  . PERIPHERAL VASCULAR BALLOON ANGIOPLASTY Right 02/04/2017   Procedure: PERIPHERAL VASCULAR BALLOON ANGIOPLASTY;  Surgeon: Waynetta Sandy, MD;  Location: [place] CV LAB;  Service: Cardiovascular;  Laterality: Right;  . TUBAL LIGATION      Allergies: Sulfa antibiotics  Medications: Prior to Admission medications   Medication Sig Start Date End Date Taking? Authorizing Provider  acetaminophen (TYLENOL) 325 MG tablet Take 650 mg by mouth every 6 (six) hours as needed for moderate  pain or headache.   Yes [provider]  amLODipine (NORVASC) 5 MG tablet Take 1 tablet (5 mg total) by mouth at bedtime. 08/17/19  Yes Claretta Fraise, MD  atorvastatin (LIPITOR) 40 MG tablet Take 1 tablet (40 mg total) by mouth daily. 08/17/19  Yes Claretta Fraise, MD  diphenhydrAMINE (BENADRYL) 25 mg capsule Take 25 mg by mouth every 8  (eight) hours as needed for allergies or sleep.    Yes [provider]  Dulaglutide (TRULICITY) 1.5 MB/5.5HR SOPN Inject content of one pen under the skin weekly on Saturday 08/17/19  Yes [name], [name], MD  fluocinonide-emollient (LIDEX-E) 0.05 % cream Apply 1 application topically 2 (two) times daily. To affected areas Patient taking differently: Apply 1 application topically 2 (two) times daily as needed (skin spots). To affected areas 09/08/18  Yes [name], [name], MD  glucose blood test strip Use to test blood sugars QID 06/19/17  Yes [name], [name], MD  insulin glargine, 1 Unit Dial, (TOUJEO SOLOSTAR) 300 UNIT/ML Solostar Pen Inject 100 Units into the skin daily. 08/17/19  Yes Claretta Fraise, MD  Lancets Misc. KIT 1 each by Does not apply route 4 (four) times daily as needed. 06/19/17  Yes [name], [name], MD  metoprolol succinate (TOPROL-XL) 100 MG 24 hr tablet TAKE ONE TABLET BY MOUTH DAILY. TAKE WITH OR IMMEDIATELY FOLLOWING A MEAL. 08/17/19  Yes [name], [name], MD  multivitamin (RENA-VIT) TABS tablet Take 1 tablet by mouth daily.   Yes [provider]  sevelamer carbonate (RENVELA) 800 MG tablet Take 1,600-2,400 mg by mouth See admin instructions. Take 2400 mg with each meal and 1600 mg with each snack   Yes [provider]     Family History  Problem Relation Age of Onset  . Diabetes Mother   . Hypertension Mother   . Cancer Father   . Diabetes Father   . Hypertension Sister   . Diabetes Daughter   . Heart disease Daughter   . Hypertension Daughter   . Diabetes Son   . Heart disease Son   . Hypertension Son   . Peripheral vascular disease Son        amputation    Social History   Socioeconomic History  . Marital status: Married    Spouse name: Not on file  . Number of children: Not on file  . Years of education: Not on file  . Highest education level: Not on file  Occupational History  . Not on file  Tobacco Use  . Smoking status: Former Smoker     Types: Cigarettes    Quit date: 07/25/1984    Years since quitting: 35.2  . Smokeless tobacco: Never Used  Vaping Use  . Vaping Use: Never used  Substance and Sexual Activity  . Alcohol use: No    Alcohol/week: 0.0 standard drinks  . Drug use: No  . Sexual activity: Not on file  Other Topics Concern  . Not on file  Social History Narrative  . Not on file   Social Determinants of Health   Financial Resource Strain:   . Difficulty of Paying Living Expenses:   Food Insecurity:   . Worried About Charity fundraiser in the Last Year:   . Arboriculturist in the Last Year:   Transportation Needs:   . Film/video editor (Medical):   Marland Kitchen Lack of Transportation (Non-Medical):   Physical Activity:   . Days of Exercise per Week:   . Minutes of Exercise per Session:  Stress:   . Feeling of Stress :   Social Connections:   . Frequency of Communication with Friends and Family:   . Frequency of Social Gatherings with Friends and Family:   . Attends Religious Services:   . Active Member of Clubs or Organizations:   . Attends Archivist Meetings:   Marland Kitchen Marital Status:     Review of Systems: A 12 point ROS discussed and pertinent positives are indicated in the HPI above.  All other systems are negative.  Review of Systems  Constitutional: Negative for activity change, fatigue and fever.  Respiratory: Negative for cough and shortness of breath.   Cardiovascular: Negative for chest pain.  Gastrointestinal: Negative for abdominal pain.  Psychiatric/Behavioral: Negative for behavioral problems and confusion.    Vital Signs: BP (!) 192/89   Pulse 76   Temp (!) 97.4 F (36.3 C)   Ht 5' 3"  (1.6 m)   Wt 245 lb (111.1 kg)   LMP  (LMP Unknown)   SpO2 98%   BMI 43.40 kg/m   Physical Exam Vitals reviewed.  Cardiovascular:     Rate and Rhythm: Normal rate and regular rhythm.     Heart sounds: Normal heart sounds.  Pulmonary:     Effort: Pulmonary effort is normal.      Breath sounds: Normal breath sounds.  Abdominal:     Palpations: Abdomen is soft.  Musculoskeletal:        General: Normal range of motion.     Comments: Right upper arm: more than one dialysis access No thrill; no pulse  Neurological:     Mental Status: She is alert.     Imaging: No results found.  Labs:  CBC: Recent Labs    12/24/18 0751 02/16/19 1429 03/18/19 0822 08/17/19 1450  WBC  --  11.6*  --  12.4*  HGB 12.2 11.5 11.9* 11.8  HCT 36.0 36.4 35.0* 35.6  PLT  --  524*  --  424    COAGS: No results for input(s): INR, APTT in the last 8760 hours.  BMP: Recent Labs    12/08/18 0924 12/08/18 0924 12/24/18 0751 02/16/19 1429 03/18/19 0822 08/17/19 1450  NA 135   < > 138 141 136 139  K 4.6   < > 3.8 4.6 4.5 4.8  CL 98  --   --  100 98 96  CO2  --   --   --  26  --  24  GLUCOSE 266*   < > 253* 142* 217* 153*  BUN 25*  --   --  19 27* 28*  CALCIUM  --   --   --  9.5  --  9.5  CREATININE 6.80*  --   --  5.87* 5.90* 7.38*  GFRNONAA  --   --   --  7*  --  5*  GFRAA  --   --   --  8*  --  6*   < > = values in this interval not displayed.    LIVER FUNCTION TESTS: Recent Labs    02/16/19 1429 08/17/19 1450  BILITOT 0.6 0.5  AST 13 7  ALT 9 7  ALKPHOS 80 75  PROT 7.8 7.9  ALBUMIN 4.1 4.2    TUMOR MARKERS: No results for input(s): AFPTM, CEA, CA199, CHROMGRNA in the last 8760 hours.  Assessment and Plan:  ESRD Clotted RUE fistula Scheduled for evaluation and possible thrombolysis and intervention Possible tunneled dialysis catheter placement Risks and benefits discussed  with the patient including, but not limited to bleeding, infection, vascular injury, pulmonary embolism, need for tunneled HD catheter placement or even death.  All of the patient's questions were answered, patient is agreeable to proceed. Consent signed and in chart.   Thank you for this interesting consult.  I greatly enjoyed meeting [facility] and look forward to  participating in their care.  A copy of this report was sent to the requesting provider on this date.  Electronically Signed: Lavonia Drafts, PA-C 10/11/2019, 1:35 PM   I spent a total of    25 Minutes in face to face in clinical consultation, greater than 50% of which was counseling/coordinating care for RUE fistula declot/possible HD catheter placement

## 2019-10-11 NOTE — Progress Notes (Signed)
Patient and daughter was given discharge instructions. Both verbalized understanding. 

## 2019-10-11 NOTE — Discharge Instructions (Signed)

## 2019-10-12 ENCOUNTER — Ambulatory Visit (INDEPENDENT_AMBULATORY_CARE_PROVIDER_SITE_OTHER): Payer: Medicare Other | Admitting: Licensed Clinical Social Worker

## 2019-10-12 DIAGNOSIS — N186 End stage renal disease: Secondary | ICD-10-CM | POA: Diagnosis not present

## 2019-10-12 DIAGNOSIS — J4530 Mild persistent asthma, uncomplicated: Secondary | ICD-10-CM

## 2019-10-12 DIAGNOSIS — N2889 Other specified disorders of kidney and ureter: Secondary | ICD-10-CM | POA: Diagnosis not present

## 2019-10-12 DIAGNOSIS — E1122 Type 2 diabetes mellitus with diabetic chronic kidney disease: Secondary | ICD-10-CM | POA: Diagnosis not present

## 2019-10-12 DIAGNOSIS — Z992 Dependence on renal dialysis: Secondary | ICD-10-CM

## 2019-10-12 DIAGNOSIS — I151 Hypertension secondary to other renal disorders: Secondary | ICD-10-CM

## 2019-10-12 DIAGNOSIS — Z794 Long term (current) use of insulin: Secondary | ICD-10-CM

## 2019-10-12 NOTE — Patient Instructions (Addendum)
Licensed Clinical Social Worker Visit Information  Goals we discussed today:  Goals Addressed              This Visit's Progress   .  Client will talk with LCSW in next 30 days about managing health issues of client and completion daily activities (pt-stated)        CARE PLAN ENTRY   Current Barriers:  . Diabetes management in client with chronic diagnoses of Astohma, HTN, ESRD, Anemia Obesity . Mobility issues (uses a walker to walk)  Clinical Social Work Clinical Goal(s):  Marland Kitchen LCSW will call client in next 30 days to discuss client management of health issues and client completion of daily activities  Interventions: . Talked with client about CCM program services . Talked with client about RNCM support with CCM program . Talked with client about dialysis treatment for client (goes to dialysis 3 days weekly, goes to dialysis Monday, Wednesday and Friday) . Talked with client about skin care issues of client . Talked with client about LCSW support with CCM program . Talked with client about client completion of ADLs . Talked with client about ambulation needs of client . Talked with client about upcoming client appointments . Talked with client about challenges in standing . Talked with client about social support network (daughter is her home health aide) . Talked with client about pain issues of client . Talked with client about transport issues of client  Patient Self Care Activities:   Attends scheduled medical appointments Eats meals independently Attends Dialysis treatments weekly as scheduled  Patient Self Care Deficits: . Diabetes management     Mobility issues  Initial goal documentation        Materials Provided: No  Follow Up Plan: LCSW to call client in next 4 weeks to talk with her about her management of health issues and to talk with her about her completion of daily activities  The patient verbalized understanding of instructions provided today and  declined a print copy of patient instruction materials.   Norva Riffle.Caris Cerveny MSW, LCSW Licensed Clinical Social Worker [facility] Care Management [phone]

## 2019-10-12 NOTE — Chronic Care Management (AMB) (Signed)
Chronic Care Management    Clinical Social Work Follow Up Note  10/12/2019 Name: Christy Zhang [facility] MRN: [id number] DOB: Apr 20, 1956  Christy Zhang is a 64 y.o. year old female who is a primary care patient of [name], [name], MD. The CCM team was consulted for assistance with [name] .   Review of patient status, including review of consultants reports, other relevant assessments, and collaboration with appropriate care team members and the patient's provider was performed as part of comprehensive patient evaluation and provision of chronic care management services.    SDOH (Social Determinants of Health) assessments performed: Yes;risk for tobacco use; risk for depression    Chronic Care Management from 10/12/2019 in [place]  PHQ-9 Total Score 5     SDOH Interventions     Most Recent Value  SDOH Interventions  Depression Interventions/Treatment  --  Roetta Sessions with client about LCSW support and RNCM support]       Outpatient Encounter Medications as of 10/12/2019  Medication Sig  . acetaminophen (TYLENOL) 325 MG tablet Take 650 mg by mouth every 6 (six) hours as needed for moderate pain or headache.  Marland Kitchen amLODipine (NORVASC) 5 MG tablet Take 1 tablet (5 mg total) by mouth at bedtime.  Marland Kitchen atorvastatin (LIPITOR) 40 MG tablet Take 1 tablet (40 mg total) by mouth daily.  . diphenhydrAMINE (BENADRYL) 25 mg capsule Take 25 mg by mouth every 8 (eight) hours as needed for allergies or sleep.   . Dulaglutide (TRULICITY) 1.5 JQ/3.0SP SOPN Inject content of one pen under the skin weekly on Saturday  . fluocinonide-emollient (LIDEX-E) 0.05 % cream Apply 1 application topically 2 (two) times daily. To affected areas (Patient taking differently: Apply 1 application topically 2 (two) times daily as needed (skin spots). To affected areas)  . glucose blood test strip Use to test blood sugars QID  . insulin glargine, 1 Unit Dial, (TOUJEO SOLOSTAR) 300  UNIT/ML Solostar Pen Inject 100 Units into the skin daily.  . Lancets Misc. KIT 1 each by Does not apply route 4 (four) times daily as needed.  . metoprolol succinate (TOPROL-XL) 100 MG 24 hr tablet TAKE ONE TABLET BY MOUTH DAILY. TAKE WITH OR IMMEDIATELY FOLLOWING A MEAL.  . multivitamin (RENA-VIT) TABS tablet Take 1 tablet by mouth daily.  . sevelamer carbonate (RENVELA) 800 MG tablet Take 1,600-2,400 mg by mouth See admin instructions. Take 2400 mg with each meal and 1600 mg with each snack   No facility-administered encounter medications on file as of 10/12/2019.    Goals Addressed              This Visit's Progress   .  Client will talk with LCSW in next 30 days about managing health issues of client and completion daily activities (pt-stated)        CARE PLAN ENTRY   Current Barriers:  . Diabetes management in client with chronic diagnoses of Astohma, HTN, ESRD, Anemia Obesity . Mobility issues (uses a walker to walk)  Clinical Social Work Clinical Goal(s):  Marland Kitchen LCSW will call client in next 30 days to discuss client management of health issues and client completion of daily activities  Interventions: . Talked with client about CCM program services . Talked with client about RNCM support with CCM program . Talked with client about dialysis treatment for client (goes to dialysis 3 days weekly, goes to dialysis Monday, Wednesday and Friday) . Talked with client about skin care issues of client . Talked with client  about LCSW support with CCM program . Talked with client about client completion of ADLs . Talked with client about ambulation needs of client . Talked with client about upcoming client appointments . Talked with client about challenges in standing . Talked with client about social support network (daughter is her home health aide) . Talked with client about pain issues of client . Talked with client about transport issues of client  Patient Self Care Activities:    Attends scheduled medical appointments Eats meals independently Attends Dialysis treatments weekly as scheduled  Patient Self Care Deficits: . Diabetes management     Mobility issues  Initial goal documentation       Follow Up Plan: LCSW to call client in next 4 weeks to talk with her about her management of health issues and to talk with her about her completion of daily activities  Norva Riffle.Wyman Meschke MSW, LCSW Licensed Clinical Social Worker [facility] [phone]

## 2019-10-14 ENCOUNTER — Telehealth: Payer: Self-pay

## 2019-10-14 ENCOUNTER — Observation Stay (HOSPITAL_COMMUNITY)
Admission: RE | Admit: 2019-10-14 | Discharge: 2019-10-15 | Disposition: A | Discharge status: Home / Self Care | Payer: Medicare Other | Attending: Family Medicine | Admitting: Family Medicine

## 2019-10-14 ENCOUNTER — Emergency Department (HOSPITAL_COMMUNITY): Payer: Medicare Other

## 2019-10-14 ENCOUNTER — Other Ambulatory Visit (HOSPITAL_COMMUNITY): Payer: Self-pay | Admitting: Nephrology

## 2019-10-14 ENCOUNTER — Encounter (HOSPITAL_COMMUNITY): Payer: Self-pay

## 2019-10-14 ENCOUNTER — Other Ambulatory Visit: Payer: Self-pay

## 2019-10-14 DIAGNOSIS — E1165 Type 2 diabetes mellitus with hyperglycemia: Secondary | ICD-10-CM | POA: Diagnosis present

## 2019-10-14 DIAGNOSIS — E8889 Other specified metabolic disorders: Secondary | ICD-10-CM | POA: Diagnosis not present

## 2019-10-14 DIAGNOSIS — N2581 Secondary hyperparathyroidism of renal origin: Secondary | ICD-10-CM | POA: Insufficient documentation

## 2019-10-14 DIAGNOSIS — E1122 Type 2 diabetes mellitus with diabetic chronic kidney disease: Secondary | ICD-10-CM | POA: Diagnosis not present

## 2019-10-14 DIAGNOSIS — Z20822 Contact with and (suspected) exposure to covid-19: Secondary | ICD-10-CM | POA: Diagnosis not present

## 2019-10-14 DIAGNOSIS — Z6841 Body Mass Index (BMI) 40.0 and over, adult: Secondary | ICD-10-CM | POA: Diagnosis not present

## 2019-10-14 DIAGNOSIS — E785 Hyperlipidemia, unspecified: Secondary | ICD-10-CM | POA: Insufficient documentation

## 2019-10-14 DIAGNOSIS — I132 Hypertensive heart and chronic kidney disease with heart failure and with stage 5 chronic kidney disease, or end stage renal disease: Secondary | ICD-10-CM | POA: Diagnosis not present

## 2019-10-14 DIAGNOSIS — I509 Heart failure, unspecified: Secondary | ICD-10-CM | POA: Diagnosis not present

## 2019-10-14 DIAGNOSIS — L97519 Non-pressure chronic ulcer of other part of right foot with unspecified severity: Secondary | ICD-10-CM | POA: Diagnosis not present

## 2019-10-14 DIAGNOSIS — D631 Anemia in chronic kidney disease: Secondary | ICD-10-CM | POA: Insufficient documentation

## 2019-10-14 DIAGNOSIS — N186 End stage renal disease: Secondary | ICD-10-CM | POA: Diagnosis not present

## 2019-10-14 DIAGNOSIS — E66813 Obesity, class 3: Secondary | ICD-10-CM | POA: Diagnosis present

## 2019-10-14 DIAGNOSIS — Z794 Long term (current) use of insulin: Secondary | ICD-10-CM | POA: Insufficient documentation

## 2019-10-14 DIAGNOSIS — Z87891 Personal history of nicotine dependence: Secondary | ICD-10-CM | POA: Insufficient documentation

## 2019-10-14 DIAGNOSIS — M869 Osteomyelitis, unspecified: Secondary | ICD-10-CM | POA: Diagnosis not present

## 2019-10-14 DIAGNOSIS — Z992 Dependence on renal dialysis: Secondary | ICD-10-CM | POA: Diagnosis not present

## 2019-10-14 DIAGNOSIS — E1169 Type 2 diabetes mellitus with other specified complication: Secondary | ICD-10-CM | POA: Diagnosis not present

## 2019-10-14 DIAGNOSIS — E669 Obesity, unspecified: Secondary | ICD-10-CM | POA: Insufficient documentation

## 2019-10-14 DIAGNOSIS — Z79899 Other long term (current) drug therapy: Secondary | ICD-10-CM | POA: Insufficient documentation

## 2019-10-14 DIAGNOSIS — E11621 Type 2 diabetes mellitus with foot ulcer: Secondary | ICD-10-CM | POA: Insufficient documentation

## 2019-10-14 DIAGNOSIS — E1151 Type 2 diabetes mellitus with diabetic peripheral angiopathy without gangrene: Secondary | ICD-10-CM | POA: Diagnosis not present

## 2019-10-14 DIAGNOSIS — I151 Hypertension secondary to other renal disorders: Secondary | ICD-10-CM

## 2019-10-14 DIAGNOSIS — L89892 Pressure ulcer of other site, stage 2: Secondary | ICD-10-CM | POA: Diagnosis not present

## 2019-10-14 DIAGNOSIS — N2889 Other specified disorders of kidney and ureter: Secondary | ICD-10-CM

## 2019-10-14 HISTORY — PX: IR US GUIDE VASC ACCESS RIGHT: IMG2390

## 2019-10-14 HISTORY — DX: Dependence on renal dialysis: Z99.2

## 2019-10-14 LAB — CBC WITH DIFFERENTIAL/PLATELET
Abs Immature Granulocytes: 0.09 10*3/uL — ABNORMAL HIGH (ref 0.00–0.07)
Basophils Absolute: 0.1 10*3/uL (ref 0.0–0.1)
Basophils Relative: 1 %
Eosinophils Absolute: 0.6 10*3/uL — ABNORMAL HIGH (ref 0.0–0.5)
Eosinophils Relative: 4 %
HCT: 35.2 % — ABNORMAL LOW (ref 36.0–46.0)
Hemoglobin: 11.2 g/dL — ABNORMAL LOW (ref 12.0–15.0)
Immature Granulocytes: 1 %
Lymphocytes Relative: 16 %
Lymphs Abs: 2.4 10*3/uL (ref 0.7–4.0)
MCH: 27.3 pg (ref 26.0–34.0)
MCHC: 31.8 g/dL (ref 30.0–36.0)
MCV: 85.9 fL (ref 80.0–100.0)
Monocytes Absolute: 1 10*3/uL (ref 0.1–1.0)
Monocytes Relative: 7 %
Neutro Abs: 10.8 10*3/uL — ABNORMAL HIGH (ref 1.7–7.7)
Neutrophils Relative %: 71 %
Platelets: 463 10*3/uL — ABNORMAL HIGH (ref 150–400)
RBC: 4.1 MIL/uL (ref 3.87–5.11)
RDW: 13.7 % (ref 11.5–15.5)
WBC: 15 10*3/uL — ABNORMAL HIGH (ref 4.0–10.5)
nRBC: 0 % (ref 0.0–0.2)

## 2019-10-14 LAB — COMPREHENSIVE METABOLIC PANEL
ALT: 9 U/L (ref 0–44)
AST: 10 U/L — ABNORMAL LOW (ref 15–41)
Albumin: 3.7 g/dL (ref 3.5–5.0)
Alkaline Phosphatase: 62 U/L (ref 38–126)
Anion gap: 15 (ref 5–15)
BUN: 25 mg/dL — ABNORMAL HIGH (ref 8–23)
CO2: 27 mmol/L (ref 22–32)
Calcium: 8.6 mg/dL — ABNORMAL LOW (ref 8.9–10.3)
Chloride: 92 mmol/L — ABNORMAL LOW (ref 98–111)
Creatinine, Ser: 8.04 mg/dL — ABNORMAL HIGH (ref 0.44–1.00)
GFR calc Af Amer: 6 mL/min — ABNORMAL LOW (ref >60)
GFR calc non Af Amer: 5 mL/min — ABNORMAL LOW (ref >60)
Glucose, Bld: 144 mg/dL — ABNORMAL HIGH (ref 70–99)
Potassium: 3.7 mmol/L (ref 3.5–5.1)
Sodium: 134 mmol/L — ABNORMAL LOW (ref 135–145)
Total Bilirubin: 0.7 mg/dL (ref 0.3–1.2)
Total Protein: 8.6 g/dL — ABNORMAL HIGH (ref 6.5–8.1)

## 2019-10-14 LAB — SARS CORONAVIRUS 2 BY RT PCR (HOSPITAL ORDER, PERFORMED IN ~~LOC~~ HOSPITAL LAB): SARS Coronavirus 2: NEGATIVE

## 2019-10-14 LAB — LACTIC ACID, PLASMA: Lactic Acid, Venous: 1.4 mmol/L (ref 0.5–1.9)

## 2019-10-14 LAB — GLUCOSE, CAPILLARY: Glucose-Capillary: 111 mg/dL — ABNORMAL HIGH (ref 70–99)

## 2019-10-14 MED ORDER — VANCOMYCIN HCL IN DEXTROSE 1-5 GM/200ML-% IV SOLN
1000.0000 mg | INTRAVENOUS | Status: DC
  Administered 2019-10-15: 1000 mg via INTRAVENOUS
  Filled 2019-10-14: qty 200

## 2019-10-14 MED ORDER — INSULIN ASPART 100 UNIT/ML ~~LOC~~ SOLN: 0.0000 [IU] | Freq: Every day | SUBCUTANEOUS | Status: DC

## 2019-10-14 MED ORDER — SODIUM CHLORIDE 0.9 % IV SOLN
2.0000 g | INTRAVENOUS | Status: DC
  Administered 2019-10-15: 2 g via INTRAVENOUS
  Filled 2019-10-14 (×2): qty 2

## 2019-10-14 MED ORDER — VANCOMYCIN HCL 2000 MG/400ML IV SOLN
2000.0000 mg | Freq: Once | INTRAVENOUS | Status: AC
  Administered 2019-10-14: 2000 mg via INTRAVENOUS

## 2019-10-14 MED ORDER — ACETAMINOPHEN 325 MG PO TABS: 650.0000 mg | ORAL_TABLET | Freq: Four times a day (QID) | ORAL | Status: DC | PRN

## 2019-10-14 MED ORDER — ONDANSETRON HCL 4 MG/2ML IJ SOLN: 4.0000 mg | Freq: Four times a day (QID) | INTRAMUSCULAR | Status: DC | PRN

## 2019-10-14 MED ORDER — ONDANSETRON HCL 4 MG PO TABS: 4.0000 mg | ORAL_TABLET | Freq: Four times a day (QID) | ORAL | Status: DC | PRN

## 2019-10-14 MED ORDER — INSULIN ASPART 100 UNIT/ML ~~LOC~~ SOLN: 0.0000 [IU] | Freq: Three times a day (TID) | SUBCUTANEOUS | Status: DC

## 2019-10-14 MED ORDER — VANCOMYCIN HCL IN DEXTROSE 1-5 GM/200ML-% IV SOLN
1000.0000 mg | Freq: Once | INTRAVENOUS | Status: DC
  Filled 2019-10-14: qty 200

## 2019-10-14 MED ORDER — ACETAMINOPHEN 650 MG RE SUPP: 650.0000 mg | Freq: Four times a day (QID) | RECTAL | Status: DC | PRN

## 2019-10-14 MED ORDER — MORPHINE SULFATE (PF) 2 MG/ML IV SOLN: 2.0000 mg | INTRAVENOUS | Status: DC | PRN

## 2019-10-14 MED ORDER — HEPARIN SODIUM (PORCINE) 5000 UNIT/ML IJ SOLN
5000.0000 [IU] | Freq: Three times a day (TID) | INTRAMUSCULAR | Status: DC
  Administered 2019-10-14 – 2019-10-15 (×2): 5000 [IU] via SUBCUTANEOUS
  Filled 2019-10-14 (×2): qty 1

## 2019-10-14 MED ORDER — SODIUM CHLORIDE 0.9 % IV SOLN
1.0000 g | Freq: Once | INTRAVENOUS | Status: AC
  Administered 2019-10-14: 1 g via INTRAVENOUS
  Filled 2019-10-14 (×2): qty 1

## 2019-10-14 NOTE — ED Triage Notes (Signed)
Pt states her daughter noted a sore to right third toe 2 days ago, pt seen foot specialist today and was sent here for an ulcer.  Pt with hx of DM and pt states CBG at home this morning was 145.

## 2019-10-14 NOTE — ED Provider Notes (Signed)
[facility] EMERGENCY DEPARTMENT Provider Note   CSN: [id number] Arrival date & time: 10/14/19  1535     History Chief Complaint  Patient presents with  . Wound Check    Christy Zhang is a 64 y.o. female.  HPI 64 year old female with a history of hypertension, ESRD on dialysis MWF, CHF with unknown EF, followed by Dr. Oneida Alar by vascular and podiatry, presents to the ER for right third toe infection.  Patient states that she was at the podiatrist today, and she had noted that her third toe seem to be getting progressively more infected.  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  She also had right toe and left toe brachial index abnormalities back in April.  She denies any fevers, chills, nausea, vomiting, weakness.  She reports podiatrist was concerned that her bones might be infected.  Denies any injuries to the area.  No numbness or tingling in the extremities.  Does not think that her foot has gone cold.    Past Medical History:  Diagnosis Date  . Anemia   . Arthritis   . Asthma    in the past  . CHF (congestive heart failure) ([place])   . Chronic kidney disease    Renal failure, dialysis on M/W/F  . 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 End stage renal disease ([place])   . End stage renal disease on dialysis ([place])   . Hemodialysis access, arteriovenous graft ([place])   . Hyperlipidemia   . Hypertension   . Neuromuscular disorder [facility])     Patient Active Problem List   Diagnosis Date Noted  . Controlled type 2 diabetes mellitus with chronic kidney disease on chronic dialysis, with long-term current use of insulin ([place]) 12/02/2016  . End stage renal disease on dialysis ([place])   . Asthma, mild persistent 07/25/2015  . Hypertension secondary to other renal disorders 07/25/2015  . Diabetes ([place]) 03/30/2015  . Anemia in chronic kidney disease 02/17/2015  . Osteopathy in diseases classified  elsewhere, unspecified site 02/17/2015  . Essential (primary) hypertension 06/24/2014  . Heart failure ([place]) 06/24/2014  . Obesity 06/24/2014    Past Surgical History:  Procedure Laterality Date  . A/V FISTULAGRAM Right 02/04/2017   Procedure: A/V Fistulagram;  Surgeon: Waynetta Sandy, MD;  Location: [place] [facility];  Service: Cardiovascular;  Laterality: Right;  . AV FISTULA PLACEMENT Right 08/25/2014   Procedure: RIGHT BRACHIOCEPHALIC ARTERIOVENOUS (AV) FISTULA CREATION;  Surgeon: Mal Misty, MD;  Location: [facility] OR;  Service: Vascular;  Laterality: Right;  . AV FISTULA PLACEMENT Right 03/18/2019   Procedure: INSERTION OF ARTERIOVENOUS (AV) GORE-TEX GRAFT RIGHT ARM;  Surgeon: Serafina Mitchell, MD;  Location: [place];  Service: Vascular;  Laterality: Right;  . BASCILIC VEIN TRANSPOSITION Right 12/24/2018   Procedure: FIRST STAGE BASILIC VEIN TRANSPOSITION RIGHT UPPER ARM;  Surgeon: Serafina Mitchell, MD;  Location: [place];  Service: Vascular;  Laterality: Right;  . FISTULA SUPERFICIALIZATION Right 11/03/2014   Procedure: RIGHT UPPER ARM FISTULA SUPERFICIALIZATION;  Surgeon: Mal Misty, MD;  Location: [place];  Service: Vascular;  Laterality: Right;  . FISTULOGRAM Right 01/16/2016   Procedure: RIGHT UPPER EXTREMITY ARTERIOVENOUS FISTULOGRAM WITH BALLOON ANGIOPLASTY;  Surgeon: Vickie Epley, MD;  Location: [facility];  Service: Vascular;  Laterality: Right;  . INSERTION OF DIALYSIS CATHETER     X4  . IR FLUORO GUIDE CV LINE RIGHT  12/03/2018  .  IR REMOVAL TUN CV CATH W/O FL  07/06/2019  . IR THROMBECTOMY AV FISTULA W/THROMBOLYSIS/PTA INC/SHUNT/IMG RIGHT Right 12/03/2018  . IR THROMBECTOMY AV FISTULA W/THROMBOLYSIS/PTA/STENT INC/SHUNT/IMG RT Right 10/11/2019  . IR US GUIDE VASC ACCESS RIGHT  12/03/2018  . IR US GUIDE VASC ACCESS RIGHT  12/03/2018  . IR US GUIDE VASC ACCESS RIGHT  10/14/2019  . LIGATION OF COMPETING BRANCHES OF ARTERIOVENOUS FISTULA Right 11/03/2014   Procedure: LIGATION OF  COMPETING BRANCHE x1 OF RIGHT UPPER ARM ARTERIOVENOUS FISTULA;  Surgeon: Mal Misty, MD;  Location: [place];  Service: Vascular;  Laterality: Right;  . PERIPHERAL VASCULAR BALLOON ANGIOPLASTY Right 02/04/2017   Procedure: PERIPHERAL VASCULAR BALLOON ANGIOPLASTY;  Surgeon: Waynetta Sandy, MD;  Location: [place] [facility];  Service: Cardiovascular;  Laterality: Right;  . TUBAL LIGATION       OB History   No obstetric history on file.     Family History  Problem Relation Age of Onset  . Diabetes Mother   . Hypertension Mother   . Cancer Father   . Diabetes Father   . Hypertension Sister   . Diabetes Daughter   . Heart disease Daughter   . Hypertension Daughter   . Diabetes Son   . Heart disease Son   . Hypertension Son   . Peripheral vascular disease Son        amputation    Social History   Tobacco Use  . Smoking status: Former Smoker    Types: Cigarettes    Quit date: 07/25/1984    Years since quitting: 35.2  . Smokeless tobacco: Never Used  Vaping Use  . Vaping Use: Never used  Substance Use Topics  . Alcohol use: No    Alcohol/week: 0.0 standard drinks  . Drug use: No    Home Medications Prior to Admission medications   Medication Sig Start Date End Date Taking? Authorizing Provider  amLODipine (NORVASC) 5 MG tablet Take 1 tablet (5 mg total) by mouth at bedtime. 08/17/19  Yes Claretta Fraise, MD  atorvastatin (LIPITOR) 40 MG tablet Take 1 tablet (40 mg total) by mouth daily. 08/17/19  Yes Claretta Fraise, MD  Dulaglutide (TRULICITY) 1.5 OM/7.6HM SOPN Inject content of one pen under the skin weekly on Saturday Patient taking differently: Inject 1.5 mg into the skin once a week.  08/17/19  Yes [name], [name], MD  insulin glargine, 1 Unit Dial, (TOUJEO SOLOSTAR) 300 UNIT/ML Solostar Pen Inject 100 Units into the skin daily. Patient taking differently: Inject 100 Units into the skin in the morning.  08/17/19  Yes [name], [name], MD  metoprolol succinate  (TOPROL-XL) 100 MG 24 hr tablet TAKE ONE TABLET BY MOUTH DAILY. TAKE WITH OR IMMEDIATELY FOLLOWING A MEAL. Patient taking differently: Take 100 mg by mouth at bedtime. Marland Kitchen TAKE WITH OR IMMEDIATELY FOLLOWING A MEAL. 08/17/19  Yes [name], [name], MD  multivitamin (RENA-VIT) TABS tablet Take 1 tablet by mouth at bedtime.    Yes [provider]  sevelamer carbonate (RENVELA) 800 MG tablet Take 1,600-2,400 mg by mouth See admin instructions. Take 2400 mg with each meal and 1600 mg with each snack   Yes [provider]    Allergies    Sulfa antibiotics  Review of Systems   Review of Systems  Constitutional: Negative for chills and fever.  HENT: Negative for ear pain and sore throat.   Eyes: Negative for pain and visual disturbance.  Respiratory: Negative for cough and shortness of breath.   Cardiovascular: Negative for  chest pain and palpitations.  Gastrointestinal: Negative for abdominal pain and vomiting.  Genitourinary: Negative for dysuria and hematuria.  Musculoskeletal: Positive for arthralgias and joint swelling. Negative for back pain.  Skin: Negative for color change and rash.  Neurological: Negative for seizures and syncope.  Psychiatric/Behavioral: Negative for confusion.  All other systems reviewed and are negative.   Physical Exam Updated Vital Signs BP (!) 176/66   Pulse 83   Temp 98.5 F (36.9 C) (Oral)   Resp 16   Ht 5\' 3"  (1.6 m)   Wt 112 kg   LMP  (LMP Unknown)   SpO2 94%   BMI 43.75 kg/m   Physical Exam Vitals and nursing note reviewed.  Constitutional:      General: She is not in acute distress.    Appearance: She is well-developed. She is obese. She is not ill-appearing, toxic-appearing or diaphoretic.  HENT:     Head: Normocephalic and atraumatic.     Mouth/Throat:     Mouth: Mucous membranes are moist.     Pharynx: Oropharynx is clear.  Eyes:     Conjunctiva/sclera: Conjunctivae normal.  Cardiovascular:     Rate and Rhythm: Normal  rate and regular rhythm.     Heart sounds: No murmur heard.   Pulmonary:     Effort: Pulmonary effort is normal. No respiratory distress.     Breath sounds: Normal breath sounds.  Abdominal:     General: Abdomen is flat.     Palpations: Abdomen is soft.     Tenderness: There is no abdominal tenderness.  Musculoskeletal:        General: Swelling, tenderness and deformity present. No signs of injury.     Cervical back: Neck supple.     Right lower leg: No edema.     Left lower leg: No edema.     Comments: Right third toe with visible deep erosion into the skin.  I was not able to place a probe into it however.  There is surrounding erythema and visible ulceration.  Second right toe also with callused over ulcer. Cap refill delayed, >3. Weak DP pulses bilaterally found with doppler. No visible ulcers/ infections to left foot  Skin:    General: Skin is warm and dry.     Capillary Refill: Capillary refill takes more than 3 seconds.     Findings: Erythema and lesion present. No bruising.  Neurological:     General: No focal deficit present.     Mental Status: She is alert.     Sensory: No sensory deficit.     Motor: No weakness.  Psychiatric:        Mood and Affect: Mood normal.           ED Results / Procedures / Treatments   Labs (all labs ordered are listed, but only abnormal results are displayed) Labs Reviewed  CBC WITH DIFFERENTIAL/PLATELET - Abnormal; Notable for the following components:      Result Value   WBC 15.0 (*)    Hemoglobin 11.2 (*)    HCT 35.2 (*)    Platelets 463 (*)    Neutro Abs 10.8 (*)    Eosinophils Absolute 0.6 (*)    Abs Immature Granulocytes 0.09 (*)    All other components within normal limits  COMPREHENSIVE METABOLIC PANEL - Abnormal; Notable for the following components:   Sodium 134 (*)    Chloride 92 (*)    Glucose, Bld 144 (*)    BUN 25 (*)  Creatinine, Ser 8.04 (*)    Calcium 8.6 (*)    Total Protein 8.6 (*)    AST 10 (*)     GFR calc non Af Amer 5 (*)    GFR calc Af Amer 6 (*)    All other components within normal limits  CULTURE, BLOOD (ROUTINE X 2)  CULTURE, BLOOD (ROUTINE X 2)  LACTIC ACID, PLASMA  URINALYSIS, ROUTINE W REFLEX MICROSCOPIC    EKG None  Radiology IR US Guide Vasc Access Right  Result Date: 10/14/2019 CLINICAL DATA:  Occluded right upper arm loop dialysis graft.  EXAM: 1. ULTRASOUND GUIDANCE FOR VASCULAR ACCESS OF DIALYSIS FISTULA. 2. DIALYSIS FISTULA DECLOT PROCEDURE WITH TWO SEPARATE GRAFT ACCESS SITES. 3. VENOUS ANGIOPLASTY OF RIGHT AXILLARY AND SUBCLAVIAN VEIN 4. INTRAVASCULAR STENT PLACEMENT IN RIGHT SUBCLAVIAN AND AXILLARY VEINS  ANESTHESIA/SEDATION: 2.5 mg IV Versed; 175 mcg IV Fentanyl.  Total Moderate Sedation Time  138 minutes  The patient's level of consciousness and physiologic status were continuously monitored during the procedure by Radiology nursing.  CONTRAST:  90 mL Omnipaque 300  MEDICATIONS: 3 mg tPA, 4000 U IV heparin  FLUOROSCOPY TIME:  19 minutes and 30 seconds.  322 mGy.  PROCEDURE: The procedure, risks, benefits, and alternatives were explained to the patient. Questions regarding the procedure were encouraged and answered. The patient understands and consents to the procedure. A time-out was performed prior to initiating the procedure.  The right upper arm dialysis graft was prepped with chlorhexidine in a sterile fashion, and a sterile drape was applied covering the operative field. A sterile gown and sterile gloves were used for the procedure. Local anesthesia was provided with 1% Lidocaine.  Preliminary ultrasound was performed of the dialysis graft. Both antegrade and retrograde graft access was performed with micropuncture sets under direct ultrasound guidance. Ultrasound image documentation was performed. t-PA was instilled via each access. 6-French antegrade and retrograde sheaths were placed. A diagnostic catheter was advanced and contrast injection performed at  the level of the right axillary vein. Outflow venography was performed via the catheter.  Catheter access was further advanced over a guidewire to the level of the right subclavian vein. Subclavian stenosis/occlusion was crossed with a hydrophilic guidewire. Balloon angioplasty of the right subclavian vein was performed with a 6 mm x 4 cm Mustang balloon.  Mechanical thrombectomy was performed within the dialysis graft as well as at the level of venous outflow, the axillary vein and subclavian vein with the Angiojet device. Thrombectomy across the arterial anastomosis was then performed with a 4-French Fogarty balloon catheter. Several passes were made with the Fogarty catheter. Suction thrombectomy was then performed through the antegrade sheath.  Graft patency was reassessed with angiography. Additional balloon angioplasty was performed within the dialysis graft and in venous outflow with the 6 mm x 4 cm Mustang balloon. Additional tPA was injected into thrombus within the right axillary vein via the Angiojet catheter and allowed to dwell for several minutes. Additional mechanical thrombectomy was performed with the Angiojet device.  Additional balloon dilatation was performed at the level of the right axillary and subclavian veins with an 8 mm x 4 cm Conquest balloon. Additional balloon dilatation was performed within the dialysis graft and across the venous anastomosis of the dialysis graft with a 7 mm x 4 cm Conquest balloon. Angiography was performed to assess patency of the graft and venous outflow.  Intravascular stent placement was performed at the level of the right subclavian vein with deployment of a 10 mm  x 60 mm self expanding Smart stent. The stent was post dilated with a 10 mm balloon. The 10 mm balloon was also used to dilate the right axillary vein. A second 10 mm x 60 mm self expanding Epic stent was placed in the right axillary vein and overlapped into the right subclavian vein stent.  Additional angiography was performed to assess stent patency.  Upon completion of the procedure, both sheaths were removed and hemostasis obtained with application of Ethilon pursestring sutures.  COMPLICATIONS: None  FINDINGS: Ultrasound confirms thrombosis of the graft. After graft access, advancement of a 5 French catheter into venous outflow was difficult. Venography demonstrates a cast of thrombus in the axillary vein and complete occlusion of outflow at the level of the axillo-subclavian junction near the rib margin. A critical occlusive stenosis was encountered of the medial right subclavian vein as it passes under the medial clavicle. This was difficult to cross but was eventually able to be crossed with a hydrophilic guidewire.  After preliminary dilatation, graft patency was re-established after mechanical thrombectomy and Fogarty balloon thrombectomy across the arterial anastomosis. There remains significant thrombus in venous outflow within the axillary vein which improved but was difficult to satisfactorily clear with mechanical thrombectomy and balloon angioplasty. 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  After right subclavian vein intravascular stent placement and dilatation, the subclavian vein showed excellent patency with no residual stenosis. There remained significant irregular stenosis of the right axillary vein after mechanical thrombectomy and balloon angioplasty. A second intravascular stent was therefore placed overlapping the first stent and extending further into the right axillary vein with excellent result and patency after placement. The right axillary vein is normally patent after stent deployment.  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 This thrombus was able to be largely cleared by mechanical thrombectomy and underlying venous stenosis and irregularity did not respond completely to balloon angioplasty necessitating the placement of overlapping self expanding stents across the right subclavian and axillary veins. Venous outflow is now widely patent after completion of the procedure. The graft itself was also successfully opened after thrombectomy with widely patent flow present.  ACCESS: The graft and venous outflow remain amenable to percutaneous intervention.   Electronically Signed   By: Aletta Edouard M.D.   On: 10/12/2019 12:04  DG Toe 3rd Right  Result Date: 10/14/2019 CLINICAL DATA:  Ulcer to the third toe EXAM: RIGHT THIRD TOE COMPARISON:  None. FINDINGS: Bones appear demineralized. There are vascular calcifications. No fracture. Second and third toe distal soft tissue ulcers. Bony destructive change involving the second and third distal phalanges suspicious for osteomyelitis. IMPRESSION: 1. Bony destructive change involving the second and third distal phalanges suspicious for osteomyelitis. Second and third toe distal soft tissue ulcers. Electronically Signed   By: Donavan Foil M.D.   On: 10/14/2019 18:06   IR THROMBECTOMY AV FISTULA W/THROMBOLYSIS/PTA/STENT INC SHUNT/IMG RT  Result Date: 10/12/2019 CLINICAL DATA:  Occluded right upper arm loop dialysis graft. EXAM: 1. ULTRASOUND GUIDANCE FOR VASCULAR ACCESS OF DIALYSIS FISTULA. 2. DIALYSIS FISTULA DECLOT PROCEDURE WITH TWO SEPARATE GRAFT ACCESS SITES. 3. VENOUS ANGIOPLASTY OF RIGHT AXILLARY AND SUBCLAVIAN VEIN 4. INTRAVASCULAR STENT PLACEMENT IN  RIGHT SUBCLAVIAN AND AXILLARY VEINS ANESTHESIA/SEDATION: 2.5 mg IV Versed; 175 mcg IV Fentanyl. Total Moderate Sedation Time 138 minutes The patient's level of  consciousness and physiologic status were continuously monitored during the procedure by Radiology nursing. CONTRAST:  90 mL Omnipaque 300 MEDICATIONS: 3 mg tPA, 4000 U IV heparin FLUOROSCOPY TIME:  19 minutes and 30 seconds.  322 mGy. PROCEDURE: The procedure, risks, benefits, and alternatives were explained to the patient. Questions regarding the procedure were encouraged and answered. The patient understands and consents to the procedure. A time-out was performed prior to initiating the procedure. The right upper arm dialysis graft was prepped with chlorhexidine in a sterile fashion, and a sterile drape was applied covering the operative field. A sterile gown and sterile gloves were used for the procedure. Local anesthesia was provided with 1% Lidocaine. Preliminary ultrasound was performed of the dialysis graft. Both antegrade and retrograde graft access was performed with micropuncture sets under direct ultrasound guidance. Ultrasound image documentation was performed. t-PA was instilled via each access. 6-French antegrade and retrograde sheaths were placed. A diagnostic catheter was advanced and contrast injection performed at the level of the right axillary vein. Outflow venography was performed via the catheter. Catheter access was further advanced over a guidewire to the level of the right subclavian vein. Subclavian stenosis/occlusion was crossed with a hydrophilic guidewire. Balloon angioplasty of the right subclavian vein was performed with a 6 mm x 4 cm Mustang balloon. Mechanical thrombectomy was performed within the dialysis graft as well as at the level of venous outflow, the axillary vein and subclavian vein with the Angiojet device. Thrombectomy across the arterial anastomosis was then performed with a 4-French Fogarty balloon catheter. Several passes were made with the Fogarty catheter. Suction thrombectomy was then performed through the antegrade sheath. Graft patency was reassessed with  angiography. Additional balloon angioplasty was performed within the dialysis graft and in venous outflow with the 6 mm x 4 cm Mustang balloon. Additional tPA was injected into thrombus within the right axillary vein via the Angiojet catheter and allowed to dwell for several minutes. Additional mechanical thrombectomy was performed with the Angiojet device. Additional balloon dilatation was performed at the level of the right axillary and subclavian veins with an 8 mm x 4 cm Conquest balloon. Additional balloon dilatation was performed within the dialysis graft and across the venous anastomosis of the dialysis graft with a 7 mm x 4 cm Conquest balloon. Angiography was performed to assess patency of the graft and venous outflow. Intravascular stent placement was performed at the level of the right subclavian vein with deployment of a 10 mm x 60 mm self expanding Smart stent. The stent was post dilated with a 10 mm balloon. The 10 mm balloon was also used to dilate the right axillary vein. A second 10 mm x 60 mm self expanding Epic stent was placed in the right axillary vein and overlapped into the right subclavian vein stent. Additional angiography was performed to assess stent patency. Upon completion of the procedure, both sheaths were removed and hemostasis obtained with application of Ethilon pursestring sutures. COMPLICATIONS: None FINDINGS: Ultrasound confirms thrombosis of the graft. After graft access, advancement of a 5 French catheter into venous outflow was difficult. Venography demonstrates a cast of thrombus in the axillary vein and complete occlusion of outflow at the level of the axillo-subclavian junction near the rib margin. A critical occlusive stenosis was encountered of the medial right subclavian vein as it passes under the medial clavicle. This  was difficult to cross but was eventually able to be crossed with a hydrophilic guidewire. After preliminary dilatation, graft patency was re-established  after mechanical thrombectomy and Fogarty balloon thrombectomy across the arterial anastomosis. There remains significant thrombus in venous outflow within the axillary vein which improved but was difficult to satisfactorily clear with mechanical thrombectomy and balloon angioplasty. 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 After right subclavian vein intravascular stent placement and dilatation, the subclavian vein showed excellent patency with no residual stenosis. There remained significant irregular stenosis of the right axillary vein after mechanical thrombectomy and balloon angioplasty. A second intravascular stent was therefore placed overlapping the first stent and extending further into the right axillary vein with excellent result and patency after placement. The right axillary vein is normally patent after stent deployment. 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 This thrombus was able to be largely cleared by mechanical thrombectomy and underlying venous stenosis and irregularity did not respond completely to balloon angioplasty necessitating the placement of overlapping self expanding stents across the right subclavian and axillary veins. Venous outflow is now widely patent after completion of the procedure. The graft itself was also successfully opened after thrombectomy with widely patent flow present. ACCESS: The graft and venous outflow remain amenable to percutaneous intervention. Electronically Signed   By: Aletta Edouard M.D.   On: 10/12/2019 12:04    Procedures Procedures (including  critical care time)  Medications Ordered in ED Medications - No data to display  ED Course  I have reviewed the triage vital signs and the nursing notes.  Pertinent labs & imaging results that were available during my care of the patient were reviewed by me and considered in my medical decision making (see chart for details).    MDM Rules/Calculators/A&P                         64 year old female with right third toe pain and ulcer, sent from podiatrist office On presentation, the patient is alert and oriented, nontoxic-appearing, in no acute distress.  She is consistently hypertensive in the ED with blood pressures of 176/76, but afebrile, not tachycardic, sats at 95%.  Physical exam with visible eroded ulcer on the right third toe.  Second toe with ulceration as well.  Mild surrounding erythema.  Sensations intact.  DP pulses were palpable with Doppler but weak. >3 cap refill.  Plain films of toes consistent with bony destructive changes involving the second and third distal phalanges suspicious for osteomyelitis with soft tissue ulcers.  CBC with leukocytosis of 15.4.  Lactic normal.  Blood cultures pending.  CMP with mild hyponatremia 134, glucose 144.  Creatinine and BUN consistent with end-stage renal disease on dialysis. Doubt sepsis at this time.  Will consult hospitalist for admission.  Started IV vancomycin and cefepime per pharmacy consult for renal dosing.  Patient will need dialysis tomorrow as well.  Spoke with hospitalist group for admission and further management.   Patient has remained hemodynamically stable here in the ER.   Final Clinical Impression(s) / ED Diagnoses Final diagnoses:  Osteomyelitis of second toe of right foot (HCC)  Osteomyelitis of third toe of  right foot [facility])    Rx / [place] Orders ED Discharge Orders    None       Lyndel Safe 10/14/19 1944    Veryl Speak, MD 10/14/19 2329

## 2019-10-14 NOTE — ED Notes (Signed)
Pt given meal and diet ginger ale

## 2019-10-14 NOTE — ED Notes (Signed)
Called [facility] to bring vancomycin and maxipime

## 2019-10-14 NOTE — ED Notes (Signed)
Attempted to give report 

## 2019-10-14 NOTE — ED Notes (Signed)
Lab at bedside to obtain blood sample. 

## 2019-10-14 NOTE — ED Notes (Signed)
Was told it wasn't infected.

## 2019-10-14 NOTE — ED Notes (Signed)
ED TO INPATIENT HANDOFF REPORT  ED Nurse Name and Phone #:  Fabio Neighbors RN [phone]  S Name/Age/Gender Christy Zhang 64 y.o. female Room/Bed: APA14/APA14  Code Status   Code Status: Full Code  Home/SNF/Other Home Patient oriented to: self, place, time and situation Is this baseline? Yes   Triage Complete: Triage complete  Chief Complaint Osteomyelitis of third toe of right foot [facility]) [M86.9]  Triage Note Pt states her daughter noted a sore to right third toe 2 days ago, pt seen foot specialist today and was sent here for an ulcer.  Pt with hx of DM and pt states CBG at home this morning was 145.     Allergies Allergies  Allergen Reactions  . Sulfa Antibiotics Swelling    Level of Care/Admitting Diagnosis ED Disposition    ED Disposition Condition [facility] Area: [facility] [[id number]]  Level of Care: Med-Surg [16]  Covid Evaluation: Asymptomatic Screening Protocol (No Symptoms)  Admission Type: Elective [3]  Diagnosis: Osteomyelitis of third toe of right foot [facility]) [[id number]]  Admitting Physician: Edmonia Lynch [[id number]]  Attending Physician: Bunnie Pion Z [[id number]]  Estimated length of stay: 3 - 4 days  Certification:: I certify this patient will need inpatient services for at least 2 midnights       B Medical/Surgery History Past Medical History:  Diagnosis Date  . Anemia   . Arthritis   . Asthma    in the past  . CHF (congestive heart failure) ([place])   . Chronic kidney disease    Renal failure, dialysis on M/W/F  . 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 End stage renal disease ([place])   . End stage renal disease on dialysis ([place])   . Hemodialysis access, arteriovenous graft ([place])   . Hyperlipidemia   . Hypertension   . Neuromuscular disorder [facility])    Past Surgical History:  Procedure Laterality Date  . A/V FISTULAGRAM Right 02/04/2017   Procedure: A/V Fistulagram;  Surgeon: Waynetta Sandy, MD;  Location: [place] CV LAB;  Service: Cardiovascular;  Laterality: Right;  . AV FISTULA PLACEMENT Right 08/25/2014   Procedure: RIGHT BRACHIOCEPHALIC ARTERIOVENOUS (AV) FISTULA CREATION;  Surgeon: Mal Misty, MD;  Location: [facility] OR;  Service: Vascular;  Laterality: Right;  . AV FISTULA PLACEMENT Right 03/18/2019   Procedure: INSERTION OF ARTERIOVENOUS (AV) GORE-TEX GRAFT RIGHT ARM;  Surgeon: Serafina Mitchell, MD;  Location: [place];  Service: Vascular;  Laterality: Right;  . BASCILIC VEIN TRANSPOSITION Right 12/24/2018   Procedure: FIRST STAGE BASILIC VEIN TRANSPOSITION RIGHT UPPER ARM;  Surgeon: Serafina Mitchell, MD;  Location: [place];  Service: Vascular;  Laterality: Right;  . FISTULA SUPERFICIALIZATION Right 11/03/2014   Procedure: RIGHT UPPER ARM FISTULA SUPERFICIALIZATION;  Surgeon: Mal Misty, MD;  Location: [place];  Service: Vascular;  Laterality: Right;  . FISTULOGRAM Right 01/16/2016   Procedure: RIGHT UPPER EXTREMITY ARTERIOVENOUS FISTULOGRAM WITH BALLOON ANGIOPLASTY;  Surgeon: Vickie Epley, MD;  Location: [facility];  Service: Vascular;  Laterality: Right;  . INSERTION OF DIALYSIS CATHETER     X4  . IR FLUORO GUIDE CV LINE RIGHT  12/03/2018  . IR REMOVAL TUN CV CATH W/O FL  07/06/2019  . IR THROMBECTOMY AV FISTULA W/THROMBOLYSIS/PTA INC/SHUNT/IMG RIGHT Right 12/03/2018  . IR THROMBECTOMY AV FISTULA W/THROMBOLYSIS/PTA/STENT INC/SHUNT/IMG RT Right 10/11/2019  . IR US GUIDE VASC ACCESS RIGHT  12/03/2018  . IR US GUIDE VASC ACCESS  RIGHT  12/03/2018  . IR US GUIDE VASC ACCESS RIGHT  10/14/2019  . LIGATION OF COMPETING BRANCHES OF ARTERIOVENOUS FISTULA Right 11/03/2014   Procedure: LIGATION OF COMPETING BRANCHE x1 OF RIGHT UPPER ARM ARTERIOVENOUS FISTULA;  Surgeon: Mal Misty, MD;  Location: [place];  Service: Vascular;  Laterality: Right;  . PERIPHERAL VASCULAR BALLOON ANGIOPLASTY Right 02/04/2017   Procedure: PERIPHERAL VASCULAR BALLOON ANGIOPLASTY;  Surgeon: Waynetta Sandy, MD;  Location: [place] CV LAB;  Service: Cardiovascular;  Laterality: Right;  . TUBAL LIGATION       A IV Location/Drains/Wounds Patient Lines/Drains/Airways Status    Active Line/Drains/Airways    Name Placement date Placement time Site Days   Peripheral IV 10/14/19 Left Forearm 10/14/19  1912  Forearm  less than 1   Fistula / Graft Right Upper arm Arteriovenous fistula 08/25/14  0802  Upper arm  1876   Fistula / Graft Right Upper arm Arteriovenous fistula 12/24/18  1131  Upper arm  294   Fistula / Graft Right Upper arm Arteriovenous vein graft 03/18/19  1155  Upper arm  210   Incision (Closed) 12/03/18 Arm Right 12/03/18  1158   315   Incision (Closed) 12/03/18 Neck Right 12/03/18  1210   315   Incision (Closed) 12/03/18 Chest Right 12/03/18  1211   315   Incision (Closed) 12/24/18 Arm Right 12/24/18  1024   294   Incision (Closed) 03/18/19 Arm Right 03/18/19  1111   210          Intake/Output Last 24 hours  Intake/Output Summary (Last 24 hours) at 10/14/2019 2240 Last data filed at 10/14/2019 2224 Gross per 24 hour  Intake 100 ml  Output --  Net 100 ml    Labs/Imaging Results for orders placed or performed during the hospital encounter of 10/14/19 (from the past 48 hour(s))  CBC with Differential     Status: Abnormal   Collection Time: 10/14/19  5:46 PM  Result Value Ref Range   WBC 15.0 (H) 4.0 - 10.5 K/uL   RBC 4.10 3.87 - 5.11 MIL/uL   Hemoglobin 11.2 (L) 12.0 - 15.0 g/dL   HCT 35.2 (L) 36 - 46 %   MCV 85.9 80.0 - 100.0 fL   MCH 27.3 26.0 - 34.0 pg   MCHC 31.8 30.0 - 36.0 g/dL   RDW 13.7 11.5 - 15.5 %   Platelets 463 (H) 150 - 400 K/uL   nRBC 0.0 0.0 - 0.2 %   Neutrophils Relative % 71 %   Neutro Abs 10.8 (H) 1.7 - 7.7 K/uL   Lymphocytes Relative 16 %   Lymphs Abs 2.4 0.7 - 4.0 K/uL   Monocytes Relative 7 %   Monocytes Absolute 1.0 0 - 1 K/uL   Eosinophils Relative 4 %   Eosinophils Absolute 0.6 (H) 0 - 0 K/uL   Basophils Relative 1 %    Basophils Absolute 0.1 0 - 0 K/uL   Immature Granulocytes 1 %   Abs Immature Granulocytes 0.09 (H) 0.00 - 0.07 K/uL    Comment: Performed at [facility], [address], [place], [place] [address]  Comprehensive metabolic panel     Status: Abnormal   Collection Time: 10/14/19  5:46 PM  Result Value Ref Range   Sodium 134 (L) 135 - 145 mmol/L   Potassium 3.7 3.5 - 5.1 mmol/L   Chloride 92 (L) 98 - 111 mmol/L   CO2 27 22 - 32 mmol/L   Glucose, Bld 144 (H)  70 - 99 mg/dL    Comment: Glucose reference range applies only to samples taken after fasting for at least 8 hours.   BUN 25 (H) 8 - 23 mg/dL   Creatinine, Ser 8.04 (H) 0.44 - 1.00 mg/dL   Calcium 8.6 (L) 8.9 - 10.3 mg/dL   Total Protein 8.6 (H) 6.5 - 8.1 g/dL   Albumin 3.7 3.5 - 5.0 g/dL   AST 10 (L) 15 - 41 U/L   ALT 9 0 - 44 U/L   Alkaline Phosphatase 62 38 - 126 U/L   Total Bilirubin 0.7 0.3 - 1.2 mg/dL   GFR calc non Af Amer 5 (L) >60 mL/min   GFR calc Af Amer 6 (L) >60 mL/min   Anion gap 15 5 - 15    Comment: Performed at [facility], [address], [place], [place] [address]  Lactic acid, plasma     Status: None   Collection Time: 10/14/19  6:06 PM  Result Value Ref Range   Lactic Acid, Venous 1.4 0.5 - 1.9 mmol/L    Comment: Performed at [facility], [address], [place], [place] [address]   IR US Guide Vasc Access Right  Result Date: 10/14/2019 CLINICAL DATA:  Occluded right upper arm loop dialysis graft.  EXAM: 1. ULTRASOUND GUIDANCE FOR VASCULAR ACCESS OF DIALYSIS FISTULA. 2. DIALYSIS FISTULA DECLOT PROCEDURE WITH TWO SEPARATE GRAFT ACCESS SITES. 3. VENOUS ANGIOPLASTY OF RIGHT AXILLARY AND SUBCLAVIAN VEIN 4. INTRAVASCULAR STENT PLACEMENT IN RIGHT SUBCLAVIAN AND AXILLARY VEINS  ANESTHESIA/SEDATION: 2.5 mg IV Versed; 175 mcg IV Fentanyl.  Total Moderate Sedation Time  138 minutes  The patient's level of consciousness and physiologic status were continuously monitored during the procedure by Radiology nursing.   CONTRAST:  90 mL Omnipaque 300  MEDICATIONS: 3 mg tPA, 4000 U IV heparin  FLUOROSCOPY TIME:  19 minutes and 30 seconds.  322 mGy.  PROCEDURE: The procedure, risks, benefits, and alternatives were explained to the patient. Questions regarding the procedure were encouraged and answered. The patient understands and consents to the procedure. A time-out was performed prior to initiating the procedure.  The right upper arm dialysis graft was prepped with chlorhexidine in a sterile fashion, and a sterile drape was applied covering the operative field. A sterile gown and sterile gloves were used for the procedure. Local anesthesia was provided with 1% Lidocaine.  Preliminary ultrasound was performed of the dialysis graft. Both antegrade and retrograde graft access was performed with micropuncture sets under direct ultrasound guidance. Ultrasound image documentation was performed. t-PA was instilled via each access. 6-French antegrade and retrograde sheaths were placed. A diagnostic catheter was advanced and contrast injection performed at the level of the right axillary vein. Outflow venography was performed via the catheter.  Catheter access was further advanced over a guidewire to the level of the right subclavian vein. Subclavian stenosis/occlusion was crossed with a hydrophilic guidewire. Balloon angioplasty of the right subclavian vein was performed with a 6 mm x 4 cm Mustang balloon.  Mechanical thrombectomy was performed within the dialysis graft as well as at the level of venous outflow, the axillary vein and subclavian vein with the Angiojet device. Thrombectomy across the arterial anastomosis was then performed with a 4-French Fogarty balloon catheter. Several passes were made with the Fogarty catheter. Suction thrombectomy was then performed through the antegrade sheath.  Graft patency was reassessed with angiography. Additional balloon angioplasty was performed within the dialysis graft and in venous  outflow with the 6 mm x 4  cm Mustang balloon. Additional tPA was injected into thrombus within the right axillary vein via the Angiojet catheter and allowed to dwell for several minutes. Additional mechanical thrombectomy was performed with the Angiojet device.  Additional balloon dilatation was performed at the level of the right axillary and subclavian veins with an 8 mm x 4 cm Conquest balloon. Additional balloon dilatation was performed within the dialysis graft and across the venous anastomosis of the dialysis graft with a 7 mm x 4 cm Conquest balloon. Angiography was performed to assess patency of the graft and venous outflow.  Intravascular stent placement was performed at the level of the right subclavian vein with deployment of a 10 mm x 60 mm self expanding Smart stent. The stent was post dilated with a 10 mm balloon. The 10 mm balloon was also used to dilate the right axillary vein. A second 10 mm x 60 mm self expanding Epic stent was placed in the right axillary vein and overlapped into the right subclavian vein stent. Additional angiography was performed to assess stent patency.  Upon completion of the procedure, both sheaths were removed and hemostasis obtained with application of Ethilon pursestring sutures.  COMPLICATIONS: None  FINDINGS: Ultrasound confirms thrombosis of the graft. After graft access, advancement of a 5 French catheter into venous outflow was difficult. Venography demonstrates a cast of thrombus in the axillary vein and complete occlusion of outflow at the level of the axillo-subclavian junction near the rib margin. A critical occlusive stenosis was encountered of the medial right subclavian vein as it passes under the medial clavicle. This was difficult to cross but was eventually able to be crossed with a hydrophilic guidewire.  After preliminary dilatation, graft patency was re-established after mechanical thrombectomy and Fogarty balloon thrombectomy across the arterial  anastomosis. There remains significant thrombus in venous outflow within the axillary vein which improved but was difficult to satisfactorily clear with mechanical thrombectomy and balloon angioplasty. 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  After right subclavian vein intravascular stent placement and dilatation, the subclavian vein showed excellent patency with no residual stenosis. There remained significant irregular stenosis of the right axillary vein after mechanical thrombectomy and balloon angioplasty. A second intravascular stent was therefore placed overlapping the first stent and extending further into the right axillary vein with excellent result and patency after placement. The right axillary vein is normally patent after stent deployment.  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 This thrombus was able to be largely cleared by mechanical thrombectomy and underlying venous stenosis and irregularity did not respond completely to balloon angioplasty necessitating the placement of overlapping self expanding stents across the right subclavian and axillary veins. Venous outflow is now widely patent after completion of the procedure. The graft itself was also successfully opened after thrombectomy with widely patent flow present.  ACCESS: The graft and venous outflow remain amenable to percutaneous intervention.   Electronically Signed   By: Aletta Edouard M.D.   On: 10/12/2019 12:04  DG Toe 3rd Right  Result Date: 10/14/2019 CLINICAL DATA:  Ulcer to the third toe EXAM: RIGHT THIRD TOE  COMPARISON:  None. FINDINGS: Bones appear demineralized. There are vascular calcifications. No  fracture. Second and third toe distal soft tissue ulcers. Bony destructive change involving the second and third distal phalanges suspicious for osteomyelitis. IMPRESSION: 1. Bony destructive change involving the second and third distal phalanges suspicious for osteomyelitis. Second and third toe distal soft tissue ulcers. Electronically Signed   By: Donavan Foil M.D.   On: 10/14/2019 18:06   IR THROMBECTOMY AV FISTULA W/THROMBOLYSIS/PTA/STENT INC SHUNT/IMG RT  Result Date: 10/12/2019 CLINICAL DATA:  Occluded right upper arm loop dialysis graft. EXAM: 1. ULTRASOUND GUIDANCE FOR VASCULAR ACCESS OF DIALYSIS FISTULA. 2. DIALYSIS FISTULA DECLOT PROCEDURE WITH TWO SEPARATE GRAFT ACCESS SITES. 3. VENOUS ANGIOPLASTY OF RIGHT AXILLARY AND SUBCLAVIAN VEIN 4. INTRAVASCULAR STENT PLACEMENT IN RIGHT SUBCLAVIAN AND AXILLARY VEINS ANESTHESIA/SEDATION: 2.5 mg IV Versed; 175 mcg IV Fentanyl. Total Moderate Sedation Time 138 minutes The patient's level of consciousness and physiologic status were continuously monitored during the procedure by Radiology nursing. CONTRAST:  90 mL Omnipaque 300 MEDICATIONS: 3 mg tPA, 4000 U IV heparin FLUOROSCOPY TIME:  19 minutes and 30 seconds.  322 mGy. PROCEDURE: The procedure, risks, benefits, and alternatives were explained to the patient. Questions regarding the procedure were encouraged and answered. The patient understands and consents to the procedure. A time-out was performed prior to initiating the procedure. The right upper arm dialysis graft was prepped with chlorhexidine in a sterile fashion, and a sterile drape was applied covering the operative field. A sterile gown and sterile gloves were used for the procedure. Local anesthesia was provided with 1% Lidocaine. Preliminary ultrasound was performed of the dialysis graft. Both antegrade and retrograde graft access was performed with  micropuncture sets under direct ultrasound guidance. Ultrasound image documentation was performed. t-PA was instilled via each access. 6-French antegrade and retrograde sheaths were placed. A diagnostic catheter was advanced and contrast injection performed at the level of the right axillary vein. Outflow venography was performed via the catheter. Catheter access was further advanced over a guidewire to the level of the right subclavian vein. Subclavian stenosis/occlusion was crossed with a hydrophilic guidewire. Balloon angioplasty of the right subclavian vein was performed with a 6 mm x 4 cm Mustang balloon. Mechanical thrombectomy was performed within the dialysis graft as well as at the level of venous outflow, the axillary vein and subclavian vein with the Angiojet device. Thrombectomy across the arterial anastomosis was then performed with a 4-French Fogarty balloon catheter. Several passes were made with the Fogarty catheter. Suction thrombectomy was then performed through the antegrade sheath. Graft patency was reassessed with angiography. Additional balloon angioplasty was performed within the dialysis graft and in venous outflow with the 6 mm x 4 cm Mustang balloon. Additional tPA was injected into thrombus within the right axillary vein via the Angiojet catheter and allowed to dwell for several minutes. Additional mechanical thrombectomy was performed with the Angiojet device. Additional balloon dilatation was performed at the level of the right axillary and subclavian veins with an 8 mm x 4 cm Conquest balloon. Additional balloon dilatation was performed within the dialysis graft and across the venous anastomosis of the dialysis graft with a 7 mm x 4 cm Conquest balloon. Angiography was performed to assess patency of the graft and venous outflow. Intravascular stent placement was performed at the level of the right subclavian vein with deployment of a 10 mm x 60 mm self expanding Smart stent. The stent  was post dilated with a 10 mm balloon. The 10 mm balloon was also used to dilate the right axillary vein. A second 10  mm x 60 mm self expanding Epic stent was placed in the right axillary vein and overlapped into the right subclavian vein stent. Additional angiography was performed to assess stent patency. Upon completion of the procedure, both sheaths were removed and hemostasis obtained with application of Ethilon pursestring sutures. COMPLICATIONS: None FINDINGS: Ultrasound confirms thrombosis of the graft. After graft access, advancement of a 5 French catheter into venous outflow was difficult. Venography demonstrates a cast of thrombus in the axillary vein and complete occlusion of outflow at the level of the axillo-subclavian junction near the rib margin. A critical occlusive stenosis was encountered of the medial right subclavian vein as it passes under the medial clavicle. This was difficult to cross but was eventually able to be crossed with a hydrophilic guidewire. After preliminary dilatation, graft patency was re-established after mechanical thrombectomy and Fogarty balloon thrombectomy across the arterial anastomosis. There remains significant thrombus in venous outflow within the axillary vein which improved but was difficult to satisfactorily clear with mechanical thrombectomy and balloon angioplasty. 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 After right subclavian vein intravascular stent placement and dilatation, the subclavian vein showed excellent patency with no residual stenosis. There remained significant irregular stenosis of the right axillary vein after mechanical thrombectomy and balloon angioplasty. A second intravascular stent was therefore placed overlapping the first stent and extending further into the right axillary vein with excellent result and patency after  placement. The right axillary vein is normally patent after stent deployment. 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 This thrombus was able to be largely cleared by mechanical thrombectomy and underlying venous stenosis and irregularity did not respond completely to balloon angioplasty necessitating the placement of overlapping self expanding stents across the right subclavian and axillary veins. Venous outflow is now widely patent after completion of the procedure. The graft itself was also successfully opened after thrombectomy with widely patent flow present. ACCESS: The graft and venous outflow remain amenable to percutaneous intervention. Electronically Signed   By: Aletta Edouard M.D.   On: 10/12/2019 12:04    Pending Labs Unresulted Labs (From admission, onward) Comment          Start     Ordered   10/15/19 0500  CBC  Tomorrow morning,   R        10/14/19 2117   10/15/19 1937  Basic metabolic panel  Tomorrow morning,   R        10/14/19 2117   10/14/19 2240  SARS Coronavirus 2 by RT PCR (hospital order, performed in [facility] hospital lab) Nasopharyngeal Nasopharyngeal Swab  (Tier 2 (TAT 2 hrs))  Once,   STAT       Question Answer Comment  Is this test for diagnosis or screening Screening   Symptomatic for COVID-19 as defined by CDC No   Hospitalized for COVID-19 No   Admitted to ICU for COVID-19 No   Previously tested for COVID-19 Unknown   Resident in a congregate (group) care setting No   Employed in healthcare setting Unknown   Pregnant No   Has patient completed COVID vaccination(s) (2 doses of Pfizer/Moderna 1 dose of [employer]) Unknown  10/14/19 2240   10/14/19 2115  HIV  Antibody (routine testing w rflx)  (HIV Antibody (Routine testing w reflex) panel)  Once,   STAT        10/14/19 2117   10/14/19 2115  Hemoglobin A1c  Once,   STAT       Comments: To assess prior glycemic control    10/14/19 2117   10/14/19 1938  Blood culture (routine x 2)  BLOOD CULTURE X 2,   STAT      10/14/19 1937   10/14/19 1812  Urinalysis, Routine w reflex microscopic  Once,   STAT        10/14/19 1811          Vitals/Pain Today's Vitals   10/14/19 2000 10/14/19 2030 10/14/19 2100 10/14/19 2130  BP: (!) 148/64 (!) 162/65 (!) 151/58 (!) 170/147  Pulse: 80 78 74 77  Resp:      Temp:      TempSrc:      SpO2: 96% 98% 97% 98%  Weight:      Height:      PainSc:        Isolation Precautions No active isolations  Medications Medications  vancomycin (VANCOREADY) IVPB 2000 mg/400 mL (2,000 mg Intravenous New Bag/Given 10/14/19 2222)  vancomycin (VANCOCIN) IVPB 1000 mg/200 mL premix (has no administration in time range)  ceFEPIme (MAXIPIME) 2 g in sodium chloride 0.9 % 100 mL IVPB (has no administration in time range)  heparin injection 5,000 Units (has no administration in time range)  acetaminophen (TYLENOL) tablet 650 mg (has no administration in time range)    Or  acetaminophen (TYLENOL) suppository 650 mg (has no administration in time range)  morphine 2 MG/ML injection 2 mg (has no administration in time range)  ondansetron (ZOFRAN) tablet 4 mg (has no administration in time range)    Or  ondansetron (ZOFRAN) injection 4 mg (has no administration in time range)  insulin aspart (novoLOG) injection 0-15 Units (has no administration in time range)  insulin aspart (novoLOG) injection 0-5 Units (has no administration in time range)  ceFEPIme (MAXIPIME) 1 g in sodium chloride 0.9 % 100 mL IVPB (0 g Intravenous Stopped 10/14/19 2224)    Mobility walks Low fall risk   Focused Assessments    R Recommendations: See Admitting Provider Note  Report given to: 300  RN Additional Notes:

## 2019-10-14 NOTE — ED Notes (Signed)
Attempted to obtain IV access x2 ED Staff. SWOT RN at bedside attempting IV ultrasound.

## 2019-10-14 NOTE — Telephone Encounter (Signed)
Triage vm from pt requesting a call back. Call placed to pt. No answer. LMOM to return call to office for questions or concerns.

## 2019-10-14 NOTE — Progress Notes (Signed)
Pharmacy Antibiotic Note  Christy Zhang is a 64 y.o. female admitted on 10/14/2019 with osteomyelitis.  Pharmacy has been consulted for vanc dosing.  Pt with a hx of ESRD (MWF) who was sent from the podiatrist office today for toes infection. Xray showed likely osteo and ulceration of toes. D/w with Sharyn Lull and we will add cefepime also.   Plan:  Vanc 2g IV x1 then 1g IV MWF with HD Cefepime 1g IV x1 then 2g IV MWF PreHD level as needed   Height: 5\' 3"  (160 cm) Weight: 112 kg (247 lb) IBW/kg (Calculated) : 52.4  Temp (24hrs), Avg:98.5 F (36.9 C), Min:98.5 F (36.9 C), Max:98.5 F (36.9 C)  Recent Labs  Lab 10/14/19 1746 10/14/19 1806  WBC 15.0*  --   CREATININE 8.04*  --   LATICACIDVEN  --  1.4    Estimated Creatinine Clearance: 8.7 mL/min (A) (by C-G formula based on SCr of 8.04 mg/dL (H)).    Allergies  Allergen Reactions  . Sulfa Antibiotics Swelling    Antimicrobials this admission: 6/17 vanc>> 6/17 cefepime>>  Dose adjustments this admission:   Microbiology results: 6/17 blood>>    Onnie Boer, PharmD, [place], AAHIVP, CPP Infectious Disease Pharmacist 10/14/2019 7:37 PM

## 2019-10-14 NOTE — ED Notes (Signed)
AC called for meal

## 2019-10-15 DIAGNOSIS — M869 Osteomyelitis, unspecified: Secondary | ICD-10-CM | POA: Diagnosis not present

## 2019-10-15 DIAGNOSIS — E1165 Type 2 diabetes mellitus with hyperglycemia: Secondary | ICD-10-CM

## 2019-10-15 HISTORY — DX: Type 2 diabetes mellitus with hyperglycemia: E11.65

## 2019-10-15 LAB — BASIC METABOLIC PANEL
Anion gap: 15 (ref 5–15)
BUN: 29 mg/dL — ABNORMAL HIGH (ref 8–23)
CO2: 27 mmol/L (ref 22–32)
Calcium: 8.5 mg/dL — ABNORMAL LOW (ref 8.9–10.3)
Chloride: 95 mmol/L — ABNORMAL LOW (ref 98–111)
Creatinine, Ser: 9.29 mg/dL — ABNORMAL HIGH (ref 0.44–1.00)
GFR calc Af Amer: 5 mL/min — ABNORMAL LOW (ref >60)
GFR calc non Af Amer: 4 mL/min — ABNORMAL LOW (ref >60)
Glucose, Bld: 104 mg/dL — ABNORMAL HIGH (ref 70–99)
Potassium: 3.6 mmol/L (ref 3.5–5.1)
Sodium: 137 mmol/L (ref 135–145)

## 2019-10-15 LAB — CBC
HCT: 31.1 % — ABNORMAL LOW (ref 36.0–46.0)
Hemoglobin: 9.9 g/dL — ABNORMAL LOW (ref 12.0–15.0)
MCH: 27.3 pg (ref 26.0–34.0)
MCHC: 31.8 g/dL (ref 30.0–36.0)
MCV: 85.9 fL (ref 80.0–100.0)
Platelets: 415 10*3/uL — ABNORMAL HIGH (ref 150–400)
RBC: 3.62 MIL/uL — ABNORMAL LOW (ref 3.87–5.11)
RDW: 13.7 % (ref 11.5–15.5)
WBC: 12.4 10*3/uL — ABNORMAL HIGH (ref 4.0–10.5)
nRBC: 0 % (ref 0.0–0.2)

## 2019-10-15 LAB — MRSA PCR SCREENING: MRSA by PCR: NEGATIVE

## 2019-10-15 LAB — GLUCOSE, CAPILLARY
Glucose-Capillary: 100 mg/dL — ABNORMAL HIGH (ref 70–99)
Glucose-Capillary: 82 mg/dL (ref 70–99)
Glucose-Capillary: 85 mg/dL (ref 70–99)

## 2019-10-15 LAB — HEMOGLOBIN A1C
Hgb A1c MFr Bld: 6.6 % — ABNORMAL HIGH (ref 4.8–5.6)
Mean Plasma Glucose: 143 mg/dL

## 2019-10-15 LAB — HIV ANTIBODY (ROUTINE TESTING W REFLEX): HIV Screen 4th Generation wRfx: NONREACTIVE

## 2019-10-15 MED ORDER — SEVELAMER CARBONATE 800 MG PO TABS
2400.0000 mg | ORAL_TABLET | Freq: Three times a day (TID) | ORAL | Status: DC
  Administered 2019-10-15 (×3): 2400 mg via ORAL
  Filled 2019-10-15 (×3): qty 3

## 2019-10-15 MED ORDER — SODIUM CHLORIDE 0.9 % IV SOLN: 100.0000 mL | INTRAVENOUS | Status: DC | PRN

## 2019-10-15 MED ORDER — SEVELAMER CARBONATE 800 MG PO TABS: 1600.0000 mg | ORAL_TABLET | ORAL | Status: DC | PRN

## 2019-10-15 MED ORDER — LOPERAMIDE HCL 2 MG PO CAPS: 2.0000 mg | ORAL_CAPSULE | Freq: Once | ORAL | Status: DC

## 2019-10-15 MED ORDER — LIDOCAINE HCL (PF) 1 % IJ SOLN: 5.0000 mL | INTRAMUSCULAR | Status: DC | PRN

## 2019-10-15 MED ORDER — VANCOMYCIN IV (FOR PTA / DISCHARGE USE ONLY): 1000.0000 mg | INTRAVENOUS | 0 refills | Status: DC

## 2019-10-15 MED ORDER — ATORVASTATIN CALCIUM 40 MG PO TABS
40.0000 mg | ORAL_TABLET | Freq: Every day | ORAL | Status: DC
  Administered 2019-10-15: 40 mg via ORAL
  Filled 2019-10-15: qty 1

## 2019-10-15 MED ORDER — LIDOCAINE-PRILOCAINE 2.5-2.5 % EX CREA: 1.0000 "application " | TOPICAL_CREAM | CUTANEOUS | Status: DC | PRN

## 2019-10-15 MED ORDER — AMLODIPINE BESYLATE 5 MG PO TABS: 5.0000 mg | ORAL_TABLET | Freq: Every day | ORAL | Status: DC

## 2019-10-15 MED ORDER — METOPROLOL SUCCINATE ER 50 MG PO TB24: 100.0000 mg | ORAL_TABLET | Freq: Every day | ORAL | Status: DC

## 2019-10-15 MED ORDER — ACETAMINOPHEN 325 MG PO TABS: 650.0000 mg | ORAL_TABLET | Freq: Four times a day (QID) | ORAL | 0 refills | Status: DC | PRN

## 2019-10-15 MED ORDER — LOPERAMIDE HCL 2 MG PO CAPS
2.0000 mg | ORAL_CAPSULE | Freq: Once | ORAL | Status: AC
  Administered 2019-10-15: 2 mg via ORAL
  Filled 2019-10-15: qty 1

## 2019-10-15 MED ORDER — CHLORHEXIDINE GLUCONATE CLOTH 2 % EX PADS: 6.0000 | MEDICATED_PAD | Freq: Every day | CUTANEOUS | Status: DC

## 2019-10-15 MED ORDER — CEFTAZIDIME IV (FOR PTA / DISCHARGE USE ONLY): 2.0000 g | INTRAVENOUS | 0 refills | Status: DC

## 2019-10-15 MED ORDER — HEPARIN SODIUM (PORCINE) 1000 UNIT/ML DIALYSIS
20.0000 [IU]/kg | INTRAMUSCULAR | Status: DC | PRN
  Filled 2019-10-15: qty 3

## 2019-10-15 MED ORDER — PENTAFLUOROPROP-TETRAFLUOROETH EX AERO: 1.0000 "application " | INHALATION_SPRAY | CUTANEOUS | Status: DC | PRN

## 2019-10-15 NOTE — Plan of Care (Signed)
  Problem: Education: Goal: Knowledge of General Education information will improve Description: Including pain rating scale, medication(s)/side effects and non-pharmacologic comfort measures 10/15/2019 1524 by Linton Flemings, RN Outcome: Completed/Met 10/15/2019 1524 by Linton Flemings, RN Outcome: Progressing   Problem: Health Behavior/Discharge Planning: Goal: Ability to manage health-related needs will improve 10/15/2019 1524 by Linton Flemings, RN Outcome: Completed/Met 10/15/2019 1524 by Linton Flemings, RN Outcome: Progressing   Problem: Clinical Measurements: Goal: Ability to maintain clinical measurements within normal limits will improve 10/15/2019 1524 by Linton Flemings, RN Outcome: Completed/Met 10/15/2019 1524 by Linton Flemings, RN Outcome: Progressing Goal: Will remain free from infection 10/15/2019 1524 by Linton Flemings, RN Outcome: Completed/Met 10/15/2019 1524 by Linton Flemings, RN Outcome: Progressing Goal: Diagnostic test results will improve 10/15/2019 1524 by Linton Flemings, RN Outcome: Completed/Met 10/15/2019 1524 by Linton Flemings, RN Outcome: Progressing Goal: Respiratory complications will improve Outcome: Completed/Met Goal: Cardiovascular complication will be avoided Outcome: Completed/Met   Problem: Activity: Goal: Risk for activity intolerance will decrease Outcome: Completed/Met   Problem: Nutrition: Goal: Adequate nutrition will be maintained Outcome: Completed/Met   Problem: Coping: Goal: Level of anxiety will decrease Outcome: Completed/Met   Problem: Elimination: Goal: Will not experience complications related to bowel motility Outcome: Completed/Met Goal: Will not experience complications related to urinary retention Outcome: Completed/Met   Problem: Pain Managment: Goal: General experience of comfort will improve Outcome: Completed/Met   Problem: Safety: Goal: Ability to remain free from injury will improve 10/15/2019 1524  by Linton Flemings, RN Outcome: Completed/Met 10/15/2019 1524 by Linton Flemings, RN Outcome: Progressing   Problem: Skin Integrity: Goal: Risk for impaired skin integrity will decrease 10/15/2019 1524 by Linton Flemings, RN Outcome: Completed/Met 10/15/2019 1524 by Linton Flemings, RN Outcome: Progressing

## 2019-10-15 NOTE — Discharge Instructions (Signed)
Give IV Fortaz and IV vancomycin after each hemodialysis sessions on Mondays Wednesdays and Fridays through 11/26/2019 for foot infection/bone infection/osteomyelitis Indication:  osteomyelitis First Dose: Yes Last Day of Therapy:  11/26/19 Labs - Once weekly:  CBC/D and BMP, Labs - Every other week:  ESR and CRP Method of administration: IV Push, after vancomycin has run on HD days

## 2019-10-15 NOTE — Progress Notes (Addendum)
PHARMACY CONSULT NOTE FOR:  OUTPATIENT  PARENTERAL ANTIBIOTIC THERAPY (OPAT)  Indication: Osteomyelitis  Regimen: Vancomycin 1gm IV to be given during the last hour of dialysis QMWF, ceftaroline 2gm IV to be given after HD QMWF End date: 7/30  IV antibiotic discharge orders are pended. To discharging provider:  please sign these orders via discharge navigator,  Select New Orders & click on the button choice - Manage This Unsigned Work.     Thank you for allowing pharmacy to be a part of this patient's care.  Donna Christen Bryley Chrisman 10/15/2019, 3:31 PM

## 2019-10-15 NOTE — Discharge Summary (Signed)
Christy Zhang, is a 64 y.o. female  DOB [birth year]  MRN [id number].  Admission date:  10/14/2019  Admitting Physician  Edmonia Lynch, DO  Discharge Date:  10/15/2019   Primary MD  Claretta Fraise, MD  Recommendations for primary care physician for things to follow:   Give IV Fortaz and IV vancomycin after each hemodialysis sessions on Mondays Wednesdays and Fridays through 11/26/2019 for foot infection/bone infection/osteomyelitis Indication:  osteomyelitis First Dose: Yes Last Day of Therapy:  11/26/19 Labs - Once weekly:  CBC/D and BMP, Labs - Every other week:  ESR and CRP Method of administration: IV Push, after vancomycin has run on HD days   Admission Diagnosis  Osteomyelitis of second toe of right foot ([place]) [M86.9] Osteomyelitis of third toe of right foot ([place]) [M86.9]   Discharge Diagnosis  Osteomyelitis of second toe of right foot ([place]) [M86.9] Osteomyelitis of third toe of right foot ([place]) [M86.9]    Principal Problem:   Osteomyelitis of third toe of right foot ([place]) Active Problems:   End stage renal disease on dialysis ([place])   Obesity   Osteomyelitis of second toe of right foot ([place])   Hyperglycemia due to diabetes mellitus ([place])      Past Medical History:  Diagnosis Date  . Anemia   . Arthritis   . Asthma    in the past  . CHF (congestive heart failure) ([place])   . Chronic kidney disease    Renal failure, dialysis on M/W/F  . 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 End stage renal disease ([place])   . End stage renal disease on dialysis ([place])   . Hemodialysis access, arteriovenous graft ([place])   . Hyperlipidemia   . Hypertension   . Neuromuscular disorder [facility])     Past Surgical History:  Procedure Laterality Date  . A/V FISTULAGRAM Right 02/04/2017   Procedure: A/V Fistulagram;  Surgeon: Waynetta Sandy, MD;  Location: [place] [facility];  Service: Cardiovascular;  Laterality: Right;  . AV FISTULA PLACEMENT Right 08/25/2014   Procedure: RIGHT BRACHIOCEPHALIC ARTERIOVENOUS (AV) FISTULA CREATION;  Surgeon: Mal Misty, MD;  Location: [facility] OR;  Service: Vascular;  Laterality: Right;  . AV FISTULA PLACEMENT Right 03/18/2019   Procedure: INSERTION OF ARTERIOVENOUS (AV) GORE-TEX GRAFT RIGHT ARM;  Surgeon: Serafina Mitchell, MD;  Location: [place];  Service: Vascular;  Laterality: Right;  . BASCILIC VEIN TRANSPOSITION Right 12/24/2018   Procedure: FIRST STAGE BASILIC VEIN TRANSPOSITION RIGHT UPPER ARM;  Surgeon: Serafina Mitchell, MD;  Location: [place];  Service: Vascular;  Laterality: Right;  . FISTULA SUPERFICIALIZATION Right 11/03/2014   Procedure: RIGHT UPPER ARM FISTULA SUPERFICIALIZATION;  Surgeon: Mal Misty, MD;  Location: [place];  Service: Vascular;  Laterality: Right;  . FISTULOGRAM Right 01/16/2016   Procedure: RIGHT UPPER EXTREMITY ARTERIOVENOUS FISTULOGRAM WITH BALLOON ANGIOPLASTY;  Surgeon: Vickie Epley, MD;  Location: [facility];  Service: Vascular;  Laterality: Right;  . INSERTION OF DIALYSIS CATHETER  X4  . IR FLUORO GUIDE CV LINE RIGHT  12/03/2018  . IR REMOVAL TUN CV CATH W/O FL  07/06/2019  . IR THROMBECTOMY AV FISTULA W/THROMBOLYSIS/PTA INC/SHUNT/IMG RIGHT Right 12/03/2018  . IR THROMBECTOMY AV FISTULA W/THROMBOLYSIS/PTA/STENT INC/SHUNT/IMG RT Right 10/11/2019  . IR US GUIDE VASC ACCESS RIGHT  12/03/2018  . IR US GUIDE VASC ACCESS RIGHT  12/03/2018  . IR US GUIDE VASC ACCESS RIGHT  10/14/2019  . LIGATION OF COMPETING BRANCHES OF ARTERIOVENOUS FISTULA Right 11/03/2014   Procedure: LIGATION OF COMPETING BRANCHE x1 OF RIGHT UPPER ARM ARTERIOVENOUS FISTULA;  Surgeon: Mal Misty, MD;  Location: [place];  Service: Vascular;  Laterality: Right;  . PERIPHERAL VASCULAR BALLOON ANGIOPLASTY Right 02/04/2017   Procedure: PERIPHERAL VASCULAR BALLOON ANGIOPLASTY;  Surgeon: Waynetta Sandy, MD;  Location: [place] [facility];   Service: Cardiovascular;  Laterality: Right;  . TUBAL LIGATION       HPI  from the history and physical done on the day of admission:     Chief Complaint: Patient sent from podiatrist office for both infection  HPI: Christy Zhang is a 64 y.o. female with medical history significant of hypertension, hyperlipidemia, ESRD on hemodialysis MWF, diabetes mellitus and obesity was sent from her podiatrist office for toes infection.  Patient states that she went to the podiatrist office because her third toe is getting progressively more infected and she is advised to go to the ED by the podiatrist because he was concerned for bone infections.  Patient denies fever, chills, recent injury to the area, numbness or tingling sensation and also patient does not feel that her right foot has gone cold.  Patient also denies chest pain, shortness of breath, nausea, vomiting, abdominal pain and urinary symptoms.  (For level 3, the HPI must include 4+ descriptors: Location, Quality, Severity, Duration, Timing, Context, modifying factors, associated signs/symptoms and/or status of 3+ chronic problems.)  (Please avoid self-populating past medical history here) (The initial 2-3 lines should be focused and good to copy and paste in the HPI section of the daily progress note).  ED Course: Arrival to the ED patient had blood pressure of 176/66, heart rate 83, temperature 98.5, respiratory rate 16 and oxygen saturation 94% on room air.  Blood work showed WBC 15, hemoglobin 11.2, sodium 134, potassium 3.7, BUN 25, creatinine 8.04, blood glucose 144.  X-ray of right foot showed bony destructive change involving second and third distal phalanx suspicious of osteomyelitis and also second and third toe distal soft tissue ulcer.  It was started on IV vancomycin and cefepime. Review of Systems: As per HPI otherwise 10 point review of systems negative   Hospital Course:    A/p 1)Osteomyelitis of Second and Third toe  of right foot -- X-ray right foot is positive for bony destruction involving second and third distal phalanges suspicious of osteomyelitis. --Treated with IV vancomycin and IV cefepime.  Blood cultures ordered.  --Discharged on  IV Fortaz and IV vancomycin after each hemodialysis sessions on Mondays Wednesdays and Fridays through 11/26/2019 for foot infection/bone infection/osteomyelitis Indication:  osteomyelitis First Dose: Yes Last Day of Therapy:  11/26/19 Labs - Once weekly:  CBC/D and BMP, Labs - Every other week:  ESR and CRP Method of administration: IV Push, after vancomycin has run on HD days --Antibiotics may be adjusted based on culture results at a later date -Ongoing follow-up with podiatrist advised -- 2) End stage renal disease on dialysis [facility]) Patient is on hemodialysis MWF.  Nephrology consult  appreciated -Had hemodialysis on 10/15/2019  3)Morbid Obesity--lifestyle and dietary modifications discussed  4)DM2-continue home insulin regimen  5)Hypertension--stable, continue Amlodipine 5 mg daily  6)Hyperlipidemia- Continue home Lipitor   Discharge Condition: stable  Follow UP--podiatrist and nephrologist   Consults obtained -nephrology  Diet and Activity recommendation:  As advised  Discharge Instructions    Discharge Instructions    [facility] pharmacist to adjust dose for Vancomycin, Aminoglycosides and other anti-infective therapies as requested by physician.   Complete by: As directed    [facility] to provide Cath Flo 52m   Complete by: As directed    Administer for PICC line occlusion and as ordered by physician for other access device issues.   Anaphylaxis Kit: Provided to treat any anaphylactic reaction to the medication being provided to the patient if First Dose or when requested by physician   Complete by: As directed    Epinephrine 165mml vial / amp: Administer 0.3m64m0.3ml37mubcutaneously once for moderate to severe  anaphylaxis, nurse to call physician and pharmacy when reaction occurs and call 911 if needed for immediate care   Diphenhydramine 50mg42mIV vial: Administer 25-50mg 60mM PRN for first dose reaction, rash, itching, mild reaction, nurse to call physician and pharmacy when reaction occurs   Sodium Chloride 0.9% NS 500ml I34mdminister if needed for hypovolemic blood pressure drop or as ordered by physician after call to physician with anaphylactic reaction   Call MD for:  difficulty breathing, headache or visual disturbances   Complete by: As directed    Call MD for:  persistant dizziness or light-headedness   Complete by: As directed    Call MD for:  persistant nausea and vomiting   Complete by: As directed    Call MD for:  severe uncontrolled pain   Complete by: As directed    Call MD for:  temperature >100.4   Complete by: As directed    Change dressing on IV access line weekly and PRN   Complete by: As directed    Diet - low sodium heart healthy   Complete by: As directed    Diet Carb Modified   Complete by: As directed    Discharge instructions   Complete by: As directed    1)-Give IV Fortaz and IV vancomycin after each hemodialysis sessions on Mondays Wednesdays and Fridays through 11/26/2019 for foot infection/bone infection/osteomyelitis Indication:  osteomyelitis First Dose: Yes Last Day of Therapy:  11/26/19 Labs - Once weekly:  CBC/D and BMP, Labs - Every other week:  ESR and CRP Method of administration: IV Push, after vancomycin has run on HD days   Discharge wound care:   Complete by: As directed    Wound dressing changes as advised by your podiatrist   Flush IV access with Sodium Chloride 0.9% and Heparin 10 units/ml or 100 units/ml   Complete by: As directed    Home infusion instructions - [facility]   Complete by: As directed    Instructions: Flush IV access with Sodium Chloride 0.9% and Heparin 10units/ml or 100units/ml   Change dressing on IV access  line: Weekly and PRN   Instructions Cath Flo 2mg: Ad100mister for PICC Line occlusion and as ordered by physician for other access device   [facility] pharmacist to adjust dose for: Vancomycin, Aminoglycosides and other anti-infective therapies as requested by physician   Increase activity slowly   Complete by: As directed    Method of administration may be changed at the discretion  of home infusion pharmacist based upon assessment of the patient and/or caregiver's ability to self-administer the medication ordered   Complete by: As directed         Discharge Medications     Allergies as of 10/15/2019      Reactions   Sulfa Antibiotics Swelling      Medication List    TAKE these medications   acetaminophen 325 MG tablet Commonly known as: TYLENOL Take 2 tablets (650 mg total) by mouth every 6 (six) hours as needed for mild pain (or Fever >/= 101).   amLODipine 5 MG tablet Commonly known as: NORVASC Take 1 tablet (5 mg total) by mouth at bedtime.   atorvastatin 40 MG tablet Commonly known as: LIPITOR Take 1 tablet (40 mg total) by mouth daily.   cefTAZidime  IVPB Commonly known as: FORTAZ Inject 2 g into the vein every Monday, Wednesday, and Friday with hemodialysis. Indication:  osteomyelitis First Dose: Yes Last Day of Therapy:  11/26/19 Labs - Once weekly:  CBC/D and BMP, Labs - Every other week:  ESR and CRP Method of administration: IV Push, after vancomycin has run on HD days Method of administration may be changed at the discretion of home infusion pharmacist based upon assessment of the patient and/or caregiver's ability to self-administer the medication ordered.   metoprolol succinate 100 MG 24 hr tablet Commonly known as: TOPROL-XL TAKE ONE TABLET BY MOUTH DAILY. TAKE WITH OR IMMEDIATELY FOLLOWING A MEAL. What changed:   how much to take  how to take this  when to take this  additional instructions   multivitamin Tabs tablet Take 1 tablet by  mouth at bedtime.   sevelamer carbonate 800 MG tablet Commonly known as: RENVELA Take 1,600-2,400 mg by mouth See admin instructions. Take 2400 mg with each meal and 1600 mg with each snack   Toujeo SoloStar 300 UNIT/ML Solostar Pen Generic drug: insulin glargine (1 Unit Dial) Inject 100 Units into the skin daily. What changed: when to take this   Trulicity 1.5 IO/9.6EX Sopn Generic drug: Dulaglutide Inject content of one pen under the skin weekly on Saturday What changed:   how much to take  how to take this  when to take this  additional instructions   vancomycin  IVPB Inject 1,000 mg into the vein every Monday, Wednesday, and Friday with hemodialysis. Indication:  osteomyelitis First Dose: No Last Day of Therapy:  11/26/19 Labs - _0 /18/21 1734   10/15/19 0000  Discharge wound care:       Comments: Wound dressing changes as advised by your podiatrist   10/15/19 1734          Major procedures and Radiology Reports - PLEASE review detailed and final reports for all details, in brief -    IR US Guide Vasc Access Right  Result Date: 10/14/2019 CLINICAL DATA:  Occluded  right upper arm loop dialysis graft. EXAM: 1. ULTRASOUND GUIDANCE FOR VASCULAR ACCESS OF DIALYSIS FISTULA. 2. DIALYSIS FISTULA DECLOT PROCEDURE WITH TWO SEPARATE GRAFT ACCESS SITES. 3. VENOUS ANGIOPLASTY OF RIGHT AXILLARY AND SUBCLAVIAN VEIN 4. INTRAVASCULAR STENT PLACEMENT  IN RIGHT SUBCLAVIAN AND AXILLARY VEINS ANESTHESIA/SEDATION: 2.5 mg IV Versed; 175 mcg IV Fentanyl. Total Moderate Sedation Time 138 minutes The patient's level of consciousness and physiologic status were continuously monitored during the procedure by Radiology nursing. CONTRAST:  90 mL Omnipaque 300 MEDICATIONS: 3 mg tPA, 4000 U IV heparin FLUOROSCOPY TIME:  19 minutes and 30 seconds.  322 mGy. PROCEDURE: The procedure, risks, benefits, and alternatives were explained to the patient. Questions regarding the procedure were encouraged and answered. The patient understands and consents to the procedure. A time-out was performed prior to initiating the procedure. The right upper arm dialysis graft was prepped with chlorhexidine in a sterile fashion, and a sterile drape was applied covering the operative field. A sterile gown and sterile gloves were used for the procedure. Local anesthesia was provided with 1% Lidocaine. Preliminary ultrasound was performed of the dialysis graft. Both antegrade and retrograde graft access was performed with micropuncture sets under direct ultrasound guidance. Ultrasound image documentation was performed. t-PA was instilled via each access. 6-French antegrade and retrograde sheaths were placed. A diagnostic catheter was advanced and contrast injection performed at the level of the right axillary vein. Outflow venography was performed via the catheter. Catheter access was further advanced over a guidewire to the level of the right subclavian vein. Subclavian stenosis/occlusion was crossed with a hydrophilic guidewire. Balloon angioplasty of the right subclavian vein was performed with a 6 mm x 4 cm Mustang balloon. Mechanical thrombectomy was performed within the dialysis graft as well as at the level of venous outflow, the axillary vein and subclavian vein with the Angiojet device. Thrombectomy across the arterial anastomosis was then performed with a 4-French Fogarty balloon catheter.  Several passes were made with the Fogarty catheter. Suction thrombectomy was then performed through the antegrade sheath. Graft patency was reassessed with angiography. Additional balloon angioplasty was performed within the dialysis graft and in venous outflow with the 6 mm x 4 cm Mustang balloon. Additional tPA was injected into thrombus within the right axillary vein via the Angiojet catheter and allowed to dwell for several minutes. Additional mechanical thrombectomy was performed with the Angiojet device. Additional balloon dilatation was performed at the level of the right axillary and subclavian veins with an 8 mm x 4 cm Conquest balloon. Additional balloon dilatation was performed within the dialysis graft and across the venous anastomosis of the dialysis graft with a 7 mm x 4 cm Conquest balloon. Angiography was performed to assess patency of the graft and venous outflow. Intravascular stent placement was performed at the level of the right subclavian vein with deployment of a 10 mm x 60 mm self expanding Smart stent. The stent was post dilated with a 10 mm balloon. The 10 mm balloon was also used to dilate the right axillary vein. A second 10 mm x 60 mm self expanding Epic stent was placed in the right axillary vein and overlapped into the right subclavian vein stent. Additional angiography was performed to assess stent patency. Upon completion of the procedure, both sheaths were removed and hemostasis obtained with application of Ethilon pursestring sutures. COMPLICATIONS: None FINDINGS: Ultrasound confirms thrombosis of the graft. After graft access, advancement of a 5 French catheter into venous outflow was difficult. Venography demonstrates a cast of  thrombus in the axillary vein and complete occlusion of outflow at the level of the axillo-subclavian junction near the rib margin. A critical occlusive stenosis was encountered of the medial right subclavian vein as it passes under the medial clavicle.  This was difficult to cross but was eventually able to be crossed with a hydrophilic guidewire. After preliminary dilatation, graft patency was re-established after mechanical thrombectomy and Fogarty balloon thrombectomy across the arterial anastomosis. There remains significant thrombus in venous outflow within the axillary vein which improved but was difficult to satisfactorily clear with mechanical thrombectomy and balloon angioplasty. 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 After right subclavian vein intravascular stent placement and dilatation, the subclavian vein showed excellent patency with no residual stenosis. There remained significant irregular stenosis of the right axillary vein after mechanical thrombectomy and balloon angioplasty. A second intravascular stent was therefore placed overlapping the first stent and extending further into the right axillary vein with excellent result and patency after placement. The right axillary vein is normally patent after stent deployment. 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 This thrombus was able to be largely cleared by mechanical thrombectomy and underlying venous stenosis and irregularity did not respond completely to balloon angioplasty necessitating the placement of overlapping self expanding stents across the right subclavian and axillary veins. Venous outflow is now widely patent after completion of the procedure. The graft itself was also successfully opened after thrombectomy with widely patent flow present. ACCESS: The graft and venous outflow  remain amenable to percutaneous intervention. Electronically Signed   By: Aletta Edouard M.D.   On: 10/12/2019 12:04   DG Toe 3rd Right  Result Date: 10/14/2019 CLINICAL DATA:  Ulcer to the third toe EXAM: RIGHT THIRD TOE COMPARISON:  None. FINDINGS: Bones appear demineralized. There are vascular calcifications. No fracture. Second and third toe distal soft tissue ulcers. Bony destructive change involving the second and third distal phalanges suspicious for osteomyelitis. IMPRESSION: 1. Bony destructive change involving the second and third distal phalanges suspicious for osteomyelitis. Second and third toe distal soft tissue ulcers. Electronically Signed   By: Donavan Foil M.D.   On: 10/14/2019 18:06   IR THROMBECTOMY AV FISTULA W/THROMBOLYSIS/PTA/STENT INC SHUNT/IMG RT  Result Date: 10/12/2019 CLINICAL DATA:  Occluded right upper arm loop dialysis graft. EXAM: 1. ULTRASOUND GUIDANCE FOR VASCULAR ACCESS OF DIALYSIS FISTULA. 2. DIALYSIS FISTULA DECLOT PROCEDURE WITH TWO SEPARATE GRAFT ACCESS SITES. 3. VENOUS ANGIOPLASTY OF RIGHT AXILLARY AND SUBCLAVIAN VEIN 4. INTRAVASCULAR STENT PLACEMENT IN RIGHT SUBCLAVIAN AND AXILLARY VEINS ANESTHESIA/SEDATION: 2.5 mg IV Versed; 175 mcg IV Fentanyl. Total Moderate Sedation Time 138 minutes The patient's level of consciousness and physiologic status were continuously monitored during the procedure by Radiology nursing. CONTRAST:  90 mL Omnipaque 300 MEDICATIONS: 3 mg tPA, 4000 U IV heparin FLUOROSCOPY TIME:  19 minutes and 30 seconds.  322 mGy. PROCEDURE: The procedure, risks, benefits, and alternatives were explained to the patient. Questions regarding the procedure were encouraged and answered. The patient understands and consents to the procedure. A time-out was performed prior to initiating the procedure. The right upper arm dialysis graft was prepped with chlorhexidine in a sterile fashion, and a sterile drape was  applied covering the operative field. A sterile  gown and sterile gloves were used for the procedure. Local anesthesia was provided with 1% Lidocaine. Preliminary ultrasound was performed of the dialysis graft. Both antegrade and retrograde graft access was performed with micropuncture sets under direct ultrasound guidance. Ultrasound image documentation was performed. t-PA was instilled via each access. 6-French antegrade and retrograde sheaths were placed. A diagnostic catheter was advanced and contrast injection performed at the level of the right axillary vein. Outflow venography was performed via the catheter. Catheter access was further advanced over a guidewire to the level of the right subclavian vein. Subclavian stenosis/occlusion was crossed with a hydrophilic guidewire. Balloon angioplasty of the right subclavian vein was performed with a 6 mm x 4 cm Mustang balloon. Mechanical thrombectomy was performed within the dialysis graft as well as at the level of venous outflow, the axillary vein and subclavian vein with the Angiojet device. Thrombectomy across the arterial anastomosis was then performed with a 4-French Fogarty balloon catheter. Several passes were made with the Fogarty catheter. Suction thrombectomy was then performed through the antegrade sheath. Graft patency was reassessed with angiography. Additional balloon angioplasty was performed within the dialysis graft and in venous outflow with the 6 mm x 4 cm Mustang balloon. Additional tPA was injected into thrombus within the right axillary vein via the Angiojet catheter and allowed to dwell for several minutes. Additional mechanical thrombectomy was performed with the Angiojet device. Additional balloon dilatation was performed at the level of the right axillary and subclavian veins with an 8 mm x 4 cm Conquest balloon. Additional balloon dilatation was performed within the dialysis graft and across the venous anastomosis of the dialysis graft with a 7 mm x 4 cm Conquest balloon. Angiography  was performed to assess patency of the graft and venous outflow. Intravascular stent placement was performed at the level of the right subclavian vein with deployment of a 10 mm x 60 mm self expanding Smart stent. The stent was post dilated with a 10 mm balloon. The 10 mm balloon was also used to dilate the right axillary vein. A second 10 mm x 60 mm self expanding Epic stent was placed in the right axillary vein and overlapped into the right subclavian vein stent. Additional angiography was performed to assess stent patency. Upon completion of the procedure, both sheaths were removed and hemostasis obtained with application of Ethilon pursestring sutures. COMPLICATIONS: None FINDINGS: Ultrasound confirms thrombosis of the graft. After graft access, advancement of a 5 French catheter into venous outflow was difficult. Venography demonstrates a cast of thrombus in the axillary vein and complete occlusion of outflow at the level of the axillo-subclavian junction near the rib margin. A critical occlusive stenosis was encountered of the medial right subclavian vein as it passes under the medial clavicle. This was difficult to cross but was eventually able to be crossed with a hydrophilic guidewire. After preliminary dilatation, graft patency was re-established after mechanical thrombectomy and Fogarty balloon thrombectomy across the arterial anastomosis. There remains significant thrombus in venous outflow within the axillary vein which improved but was difficult to satisfactorily clear with mechanical thrombectomy and balloon angioplasty. 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 After right subclavian vein intravascular stent placement and dilatation, the subclavian vein showed excellent patency with no residual stenosis. There remained significant irregular stenosis of the right axillary vein after  mechanical thrombectomy and balloon angioplasty. A  second intravascular stent was therefore placed overlapping the first stent and extending further into the right axillary vein with excellent result and patency after placement. The right axillary vein is normally patent after stent deployment. 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 This thrombus was able to be largely cleared by mechanical thrombectomy and underlying venous stenosis and irregularity did not respond completely to balloon angioplasty necessitating the placement of overlapping self expanding stents across the right subclavian and axillary veins. Venous outflow is now widely patent after completion of the procedure. The graft itself was also successfully opened after thrombectomy with widely patent flow present. ACCESS: The graft and venous outflow remain amenable to percutaneous intervention. Electronically Signed   By: Aletta Edouard M.D.   On: 10/12/2019 12:04    Micro Results   Recent Results (from the past 240 hour(s))  Blood culture (routine x 2)     Status: None (Preliminary result)   Collection Time: 10/14/19  7:46 PM   Specimen: BLOOD  Result Value Ref Range Status   Specimen Description BLOOD LEFT ANTECUBITAL  Final   Special Requests   Final    BOTTLES DRAWN AEROBIC AND ANAEROBIC Blood Culture adequate volume   Culture   Final    NO GROWTH < 12 HOURS Performed at [facility], [address], [place], [place] [address]    Report Status PENDING  Incomplete  Blood culture (routine x 2)     Status: None (Preliminary result)   Collection Time: 10/14/19  7:55 PM   Specimen: BLOOD LEFT HAND  Result Value Ref Range Status   Specimen  Description BLOOD LEFT HAND  Final   Special Requests   Final    BOTTLES DRAWN AEROBIC AND ANAEROBIC Blood Culture adequate volume   Culture   Final    NO GROWTH < 12 HOURS Performed at [facility], [address] [place], [place] [address]    Report Status PENDING  Incomplete  SARS Coronavirus 2 by RT PCR (hospital order, performed in [place] hospital lab) Nasopharyngeal Nasopharyngeal Swab     Status: None   Collection Time: 10/14/19 10:48 PM   Specimen: Nasopharyngeal Swab  Result Value Ref Range Status   SARS Coronavirus 2 NEGATIVE NEGATIVE Final    Comment: (NOTE) SARS-CoV-2 target nucleic acids are NOT DETECTED.  The SARS-CoV-2 RNA is generally detectable in upper and lower respiratory specimens during the acute phase of infection. The lowest concentration of SARS-CoV-2 viral copies this assay can detect is 250 copies / mL. A negative result does not preclude SARS-CoV-2 infection and should not be used as the sole basis for treatment or other patient management decisions.  A negative result may occur with improper specimen collection / handling, submission of specimen other than nasopharyngeal swab, presence of viral mutation(s) within the areas targeted by this assay, and inadequate number of viral copies (<250 copies / mL). A negative result must be combined with clinical observations, patient history, and epidemiological information.  Fact Sheet for Patients:   StrictlyIdeas.no  Fact Sheet for Healthcare Providers: BankingDealers.co.za  This test is not yet approved or  cleared by the [place] FDA and has been authorized for detection and/or diagnosis of SARS-CoV-2 by FDA under an Emergency Use Authorization (EUA).  This EUA will  remain in effect (meaning this test can be used) for the duration of the COVID-19 declaration under Section 564(b)(1) of the Act, 21 U.S.C. section 360bbb-3(b)(1), unless the  authorization is terminated or revoked sooner.  Performed at [facility], [address] [place], [place] [address]   MRSA PCR Screening     Status: None   Collection Time: 10/14/19 11:30 PM   Specimen: Nasopharyngeal  Result Value Ref Range Status   MRSA by PCR NEGATIVE NEGATIVE Final    Comment:        The GeneXpert MRSA Assay (FDA approved for NASAL specimens only), is one component of a comprehensive MRSA colonization surveillance program. It is not intended to diagnose MRSA infection nor to guide or monitor treatment for MRSA infections. Performed at [facility], [address], [place], [place] [address]     Today   [place] today has no new complaints          Patient has been seen and examined prior to discharge   Objective   Blood pressure (!) 150/60, pulse 79, temperature 98.1 F (36.7 C), temperature source Oral, resp. rate 18, height 5' 3" (1.6 m), weight 108.8 kg, SpO2 99 %.   Intake/Output Summary (Last 24 hours) at 10/15/2019 1735 Last data filed at 10/15/2019 1500 Gross per 24 hour  Intake 270 ml  Output 2700 ml  Net -2430 ml   Exam Gen:- Awake Alert, no acute distress  HEENT:- [place].AT, No sclera icterus Neck-Supple Neck,No JVD,.  Lungs-  CTAB , good air movement bilaterally  CV- S1, S2 normal, regular Abd-  +ve B.Sounds, Abd Soft, No tenderness,    Extremity/Skin:- No  edema,   good pulses, ulceration of right 3rd toe, Psych-affect is appropriate, oriented x3 Neuro-no new focal deficits, no tremors  MSK-- RUE AVG +T/B with sutures in place from recent thrombectomy   Data Review   CBC w Diff:  Lab Results  Component Value Date   WBC 12.4 (H) 10/15/2019   HGB 9.9 (L) 10/15/2019   HGB 11.8 08/17/2019   HCT 31.1 (L) 10/15/2019   HCT 35.6 08/17/2019   PLT 415 (H) 10/15/2019   PLT 424 08/17/2019   LYMPHOPCT 16 10/14/2019   MONOPCT 7 10/14/2019   EOSPCT 4 10/14/2019   BASOPCT 1 10/14/2019    CMP:  Lab Results    Component Value Date   NA 137 10/15/2019   NA 139 08/17/2019   K 3.6 10/15/2019   CL 95 (L) 10/15/2019   CO2 27 10/15/2019   BUN 29 (H) 10/15/2019   BUN 28 (H) 08/17/2019   CREATININE 9.29 (H) 10/15/2019   PROT 8.6 (H) 10/14/2019   PROT 7.9 08/17/2019   ALBUMIN 3.7 10/14/2019   ALBUMIN 4.2 08/17/2019   BILITOT 0.7 10/14/2019   BILITOT 0.5 08/17/2019   ALKPHOS 62 10/14/2019   AST 10 (L) 10/14/2019   ALT 9 10/14/2019  .   Total Discharge time is about 33 minutes  Roxan Hockey M.D on 10/15/2019 at 5:35 PM  Go to [email or web address] -  for contact info  [facility] - Office  [phone]

## 2019-10-15 NOTE — Care Management Important Message (Signed)
Important Message  Patient Details  Name: Christy Zhang MRN: [id number] Date of Birth: 01/18/1956   Medicare Important Message Given:  Yes     Tommy Medal 10/15/2019, 4:22 PM

## 2019-10-15 NOTE — H&P (Signed)
History and Physical    Arlene Brickel [facility] YDX:[id number] DOB: 1956/03/10 DOA: 10/14/2019  PCP: Claretta Fraise, MD (Confirm with patient/family/NH records and if not entered, this has to be entered at [facility] point of entry) Patient coming from: Home  I have personally briefly reviewed patient's old medical records in [place]  Chief Complaint: Patient sent from podiatrist office for both infection  HPI: Christy Zhang is a 64 y.o. female with medical history significant of hypertension, hyperlipidemia, ESRD on hemodialysis MWF, diabetes mellitus and obesity was sent from her podiatrist office for toes infection.  Patient states that she went to the podiatrist office because her third toe is getting progressively more infected and she is advised to go to the ED by the podiatrist because he was concerned for bone infections.  Patient denies fever, chills, recent injury to the area, numbness or tingling sensation and also patient does not feel that her right foot has gone cold.  Patient also denies chest pain, shortness of breath, nausea, vomiting, abdominal pain and urinary symptoms.  (For level 3, the HPI must include 4+ descriptors: Location, Quality, Severity, Duration, Timing, Context, modifying factors, associated signs/symptoms and/or status of 3+ chronic problems.)  (Please avoid self-populating past medical history here) (The initial 2-3 lines should be focused and good to copy and paste in the HPI section of the daily progress note).  ED Course: Arrival to the ED patient had blood pressure of 176/66, heart rate 83, temperature 98.5, respiratory rate 16 and oxygen saturation 94% on room air.  Blood work showed WBC 15, hemoglobin 11.2, sodium 134, potassium 3.7, BUN 25, creatinine 8.04, blood glucose 144.  X-ray of right foot showed bony destructive change involving second and third distal phalanx suspicious of osteomyelitis and also second and third toe distal soft tissue  ulcer.  It was started on IV vancomycin and cefepime. Review of Systems: As per HPI otherwise 10 point review of systems negative.  Unacceptable ROS statements: "10 systems reviewed," "Extensive" (without elaboration).  Acceptable ROS statements: "All others negative," "All others reviewed and are negative," and "All others unremarkable," with at [place] documented Can't double dip - if using for HPI can't use for ROS  Past Medical History:  Diagnosis Date  . Anemia   . Arthritis   . Asthma    in the past  . CHF (congestive heart failure) ([place])   . Chronic kidney disease    Renal failure, dialysis on M/W/F  . 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 End stage renal disease ([place])   . End stage renal disease on dialysis ([place])   . Hemodialysis access, arteriovenous graft ([place])   . Hyperlipidemia   . Hypertension   . Neuromuscular disorder [facility])     Past Surgical History:  Procedure Laterality Date  . A/V FISTULAGRAM Right 02/04/2017   Procedure: A/V Fistulagram;  Surgeon: Waynetta Sandy, MD;  Location: [place] [facility];  Service: Cardiovascular;  Laterality: Right;  . AV FISTULA PLACEMENT Right 08/25/2014   Procedure: RIGHT BRACHIOCEPHALIC ARTERIOVENOUS (AV) FISTULA CREATION;  Surgeon: Mal Misty, MD;  Location: [facility] OR;  Service: Vascular;  Laterality: Right;  . AV FISTULA PLACEMENT Right 03/18/2019   Procedure: INSERTION OF ARTERIOVENOUS (AV) GORE-TEX GRAFT RIGHT ARM;  Surgeon: Serafina Mitchell, MD;  Location: [place];  Service: Vascular;  Laterality: Right;  . BASCILIC VEIN TRANSPOSITION Right 12/24/2018   Procedure: FIRST STAGE BASILIC VEIN TRANSPOSITION RIGHT UPPER ARM;  Surgeon: Serafina Mitchell, MD;  Location: [facility] OR;  Service: Vascular;  Laterality: Right;  . FISTULA SUPERFICIALIZATION Right 11/03/2014   Procedure: RIGHT UPPER ARM FISTULA SUPERFICIALIZATION;  Surgeon: Mal Misty, MD;  Location: [place];  Service: Vascular;  Laterality: Right;  .  FISTULOGRAM Right 01/16/2016   Procedure: RIGHT UPPER EXTREMITY ARTERIOVENOUS FISTULOGRAM WITH BALLOON ANGIOPLASTY;  Surgeon: Vickie Epley, MD;  Location: [facility];  Service: Vascular;  Laterality: Right;  . INSERTION OF DIALYSIS CATHETER     X4  . IR FLUORO GUIDE CV LINE RIGHT  12/03/2018  . IR REMOVAL TUN CV CATH W/O FL  07/06/2019  . IR THROMBECTOMY AV FISTULA W/THROMBOLYSIS/PTA INC/SHUNT/IMG RIGHT Right 12/03/2018  . IR THROMBECTOMY AV FISTULA W/THROMBOLYSIS/PTA/STENT INC/SHUNT/IMG RT Right 10/11/2019  . IR US GUIDE VASC ACCESS RIGHT  12/03/2018  . IR US GUIDE VASC ACCESS RIGHT  12/03/2018  . IR US GUIDE VASC ACCESS RIGHT  10/14/2019  . LIGATION OF COMPETING BRANCHES OF ARTERIOVENOUS FISTULA Right 11/03/2014   Procedure: LIGATION OF COMPETING BRANCHE x1 OF RIGHT UPPER ARM ARTERIOVENOUS FISTULA;  Surgeon: Mal Misty, MD;  Location: [place];  Service: Vascular;  Laterality: Right;  . PERIPHERAL VASCULAR BALLOON ANGIOPLASTY Right 02/04/2017   Procedure: PERIPHERAL VASCULAR BALLOON ANGIOPLASTY;  Surgeon: Waynetta Sandy, MD;  Location: [place] [facility] LAB;  Service: Cardiovascular;  Laterality: Right;  . TUBAL LIGATION       reports that she quit smoking about 35 years ago. Her smoking use included cigarettes. She has never used smokeless tobacco. She reports that she does not drink alcohol and does not use drugs.  Allergies  Allergen Reactions  . Sulfa Antibiotics Swelling    Family History  Problem Relation Age of Onset  . Diabetes Mother   . Hypertension Mother   . Cancer Father   . Diabetes Father   . Hypertension Sister   . Diabetes Daughter   . Heart disease Daughter   . Hypertension Daughter   . Diabetes Son   . Heart disease Son   . Hypertension Son   . Peripheral vascular disease Son        amputation    Unacceptable: Noncontributory, unremarkable, or negative. Acceptable: (example)Family history negative for heart disease  Prior to Admission medications     Medication Sig Start Date End Date Taking? Authorizing Provider  amLODipine (NORVASC) 5 MG tablet Take 1 tablet (5 mg total) by mouth at bedtime. 08/17/19  Yes Claretta Fraise, MD  atorvastatin (LIPITOR) 40 MG tablet Take 1 tablet (40 mg total) by mouth daily. 08/17/19  Yes Claretta Fraise, MD  Dulaglutide (TRULICITY) 1.5 YK/9.9IP SOPN Inject content of one pen under the skin weekly on Saturday Patient taking differently: Inject 1.5 mg into the skin once a week.  08/17/19  Yes [name], [name], MD  insulin glargine, 1 Unit Dial, (TOUJEO SOLOSTAR) 300 UNIT/ML Solostar Pen Inject 100 Units into the skin daily. Patient taking differently: Inject 100 Units into the skin in the morning.  08/17/19  Yes [name], [name], MD  metoprolol succinate (TOPROL-XL) 100 MG 24 hr tablet TAKE ONE TABLET BY MOUTH DAILY. TAKE WITH OR IMMEDIATELY FOLLOWING A MEAL. Patient taking differently: Take 100 mg by mouth at bedtime. Marland Kitchen TAKE WITH OR IMMEDIATELY FOLLOWING A MEAL. 08/17/19  Yes [name], [name], MD  multivitamin (RENA-VIT) TABS tablet Take 1 tablet by mouth at bedtime.    Yes [provider]  sevelamer carbonate (RENVELA) 800 MG tablet Take 1,600-2,400 mg by mouth See admin  instructions. Take 2400 mg with each meal and 1600 mg with each snack   Yes [provider]    Physical Exam: Vitals:   10/14/19 2100 10/14/19 2130 10/14/19 2307 10/15/19 0424  BP: (!) 151/58 (!) 170/147 (!) 166/66 (!) 147/53  Pulse: 74 77 82 74  Resp:   16 16  Temp:   98.9 F (37.2 C) 98.8 F (37.1 C)  TempSrc:   Oral   SpO2: 97% 98% 98% 100%  Weight:   110.2 kg   Height:   5\' 3"  (1.6 m)     Constitutional: NAD, calm, comfortable Vitals:   10/14/19 2100 10/14/19 2130 10/14/19 2307 10/15/19 0424  BP: (!) 151/58 (!) 170/147 (!) 166/66 (!) 147/53  Pulse: 74 77 82 74  Resp:   16 16  Temp:   98.9 F (37.2 C) 98.8 F (37.1 C)  TempSrc:   Oral   SpO2: 97% 98% 98% 100%  Weight:   110.2 kg   Height:   5\' 3"  (1.6 m)      General: Patient is an obese 64 year old Caucasian female in no acute distress. Eyes: PERRL, lids and conjunctivae normal ENMT: Mucous membranes are moist. Posterior pharynx clear of any exudate or lesions.Normal dentition.  Neck: normal, supple, no masses, no thyromegaly Respiratory: clear to auscultation bilaterally, no wheezing, no crackles. Normal respiratory effort. No accessory muscle use.  Cardiovascular: Regular rate and rhythm, no murmurs / rubs / gallops. No extremity edema. 2+ pedal pulses. No carotid bruits.  Abdomen: no tenderness, no masses palpated. No hepatosplenomegaly. Bowel sounds positive.  Musculoskeletal: Both feet are dry.  Patient has swelling, tenderness and ulceration of right second and third toes.  Right toe with visible deep erosion into the skin while right second toe has a callus or ulcer.  Week distal pulses bilaterally.  No visible ulcers or infections to the left foot.  No clubbing / cyanosis. No joint deformity upper and lower extremities. Good ROM, no contractures. Normal muscle tone.  Skin: no rashes, lesions, ulcers. No induration Neurologic: CN 2-12 grossly intact. Sensation intact, DTR normal. Strength 5/5 in all 4.  Psychiatric: Normal judgment and insight. Alert and oriented x 3. Normal mood.   (Anything < 9 systems with 2 bullets each down codes to level 1) (If patient refuses exam can't bill higher level) (Make sure to document decubitus ulcers present on admission -- if possible -- and whether patient has chronic indwelling catheter at time of admission)  Labs on Admission: I have personally reviewed following labs and imaging studies  CBC: Recent Labs  Lab 10/14/19 1746  WBC 15.0*  NEUTROABS 10.8*  HGB 11.2*  HCT 35.2*  MCV 85.9  PLT 400*   Basic Metabolic Panel: Recent Labs  Lab 10/14/19 1746  NA 134*  K 3.7  CL 92*  CO2 27  GLUCOSE 144*  BUN 25*  CREATININE 8.04*  CALCIUM 8.6*   GFR: Estimated Creatinine Clearance: 8.6  mL/min (A) (by C-G formula based on SCr of 8.04 mg/dL (H)). Liver Function Tests: Recent Labs  Lab 10/14/19 1746  AST 10*  ALT 9  ALKPHOS 62  BILITOT 0.7  PROT 8.6*  ALBUMIN 3.7   No results for input(s): LIPASE, AMYLASE in the last 168 hours. No results for input(s): AMMONIA in the last 168 hours. Coagulation Profile: No results for input(s): INR, PROTIME in the last 168 hours. Cardiac Enzymes: No results for input(s): CKTOTAL, CKMB, CKMBINDEX, TROPONINI in the last 168 hours. BNP (last 3 results)  No results for input(s): PROBNP in the last 8760 hours. HbA1C: No results for input(s): HGBA1C in the last 72 hours. CBG: Recent Labs  Lab 10/11/19 1155 10/14/19 2309  GLUCAP 79 111*   Lipid Profile: No results for input(s): CHOL, HDL, LDLCALC, TRIG, CHOLHDL, LDLDIRECT in the last 72 hours. Thyroid Function Tests: No results for input(s): TSH, T4TOTAL, FREET4, T3FREE, THYROIDAB in the last 72 hours. Anemia Panel: No results for input(s): VITAMINB12, FOLATE, FERRITIN, TIBC, IRON, RETICCTPCT in the last 72 hours. Urine analysis: No results found for: COLORURINE, APPEARANCEUR, LABSPEC, [place], GLUCOSEU, HGBUR, BILIRUBINUR, KETONESUR, PROTEINUR, UROBILINOGEN, NITRITE, LEUKOCYTESUR  Radiological Exams on Admission: IR US Guide Vasc Access Right  Result Date: 10/14/2019 CLINICAL DATA:  Occluded right upper arm loop dialysis graft.  EXAM: 1. ULTRASOUND GUIDANCE FOR VASCULAR ACCESS OF DIALYSIS FISTULA. 2. DIALYSIS FISTULA DECLOT PROCEDURE WITH TWO SEPARATE GRAFT ACCESS SITES. 3. VENOUS ANGIOPLASTY OF RIGHT AXILLARY AND SUBCLAVIAN VEIN 4. INTRAVASCULAR STENT PLACEMENT IN RIGHT SUBCLAVIAN AND AXILLARY VEINS  ANESTHESIA/SEDATION: 2.5 mg IV Versed; 175 mcg IV Fentanyl.  Total Moderate Sedation Time  138 minutes  The patient's level of consciousness and physiologic status were continuously monitored during the procedure by Radiology nursing.  CONTRAST:  90 mL Omnipaque 300  MEDICATIONS:  3 mg tPA, 4000 U IV heparin  FLUOROSCOPY TIME:  19 minutes and 30 seconds.  322 mGy.  PROCEDURE: The procedure, risks, benefits, and alternatives were explained to the patient. Questions regarding the procedure were encouraged and answered. The patient understands and consents to the procedure. A time-out was performed prior to initiating the procedure.  The right upper arm dialysis graft was prepped with chlorhexidine in a sterile fashion, and a sterile drape was applied covering the operative field. A sterile gown and sterile gloves were used for the procedure. Local anesthesia was provided with 1% Lidocaine.  Preliminary ultrasound was performed of the dialysis graft. Both antegrade and retrograde graft access was performed with micropuncture sets under direct ultrasound guidance. Ultrasound image documentation was performed. t-PA was instilled via each access. 6-French antegrade and retrograde sheaths were placed. A diagnostic catheter was advanced and contrast injection performed at the level of the right axillary vein. Outflow venography was performed via the catheter.  Catheter access was further advanced over a guidewire to the level of the right subclavian vein. Subclavian stenosis/occlusion was crossed with a hydrophilic guidewire. Balloon angioplasty of the right subclavian vein was performed with a 6 mm x 4 cm Mustang balloon.  Mechanical thrombectomy was performed within the dialysis graft as well as at the level of venous outflow, the axillary vein and subclavian vein with the Angiojet device. Thrombectomy across the arterial anastomosis was then performed with a 4-French Fogarty balloon catheter. Several passes were made with the Fogarty catheter. Suction thrombectomy was then performed through the antegrade sheath.  Graft patency was reassessed with angiography. Additional balloon angioplasty was performed within the dialysis graft and in venous outflow with the 6 mm x 4 cm Mustang balloon.  Additional tPA was injected into thrombus within the right axillary vein via the Angiojet catheter and allowed to dwell for several minutes. Additional mechanical thrombectomy was performed with the Angiojet device.  Additional balloon dilatation was performed at the level of the right axillary and subclavian veins with an 8 mm x 4 cm Conquest balloon. Additional balloon dilatation was performed within the dialysis graft and across the venous anastomosis of the dialysis graft with a 7 mm x 4 cm Conquest balloon. Angiography was  performed to assess patency of the graft and venous outflow.  Intravascular stent placement was performed at the level of the right subclavian vein with deployment of a 10 mm x 60 mm self expanding Smart stent. The stent was post dilated with a 10 mm balloon. The 10 mm balloon was also used to dilate the right axillary vein. A second 10 mm x 60 mm self expanding Epic stent was placed in the right axillary vein and overlapped into the right subclavian vein stent. Additional angiography was performed to assess stent patency.  Upon completion of the procedure, both sheaths were removed and hemostasis obtained with application of Ethilon pursestring sutures.  COMPLICATIONS: None  FINDINGS: Ultrasound confirms thrombosis of the graft. After graft access, advancement of a 5 French catheter into venous outflow was difficult. Venography demonstrates a cast of thrombus in the axillary vein and complete occlusion of outflow at the level of the axillo-subclavian junction near the rib margin. A critical occlusive stenosis was encountered of the medial right subclavian vein as it passes under the medial clavicle. This was difficult to cross but was eventually able to be crossed with a hydrophilic guidewire.  After preliminary dilatation, graft patency was re-established after mechanical thrombectomy and Fogarty balloon thrombectomy across the arterial anastomosis. There remains significant thrombus  in venous outflow within the axillary vein which improved but was difficult to satisfactorily clear with mechanical thrombectomy and balloon angioplasty. 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  After right subclavian vein intravascular stent placement and dilatation, the subclavian vein showed excellent patency with no residual stenosis. There remained significant irregular stenosis of the right axillary vein after mechanical thrombectomy and balloon angioplasty. A second intravascular stent was therefore placed overlapping the first stent and extending further into the right axillary vein with excellent result and patency after placement. The right axillary vein is normally patent after stent deployment.  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 This thrombus was able to be largely cleared by mechanical thrombectomy and underlying venous stenosis and irregularity did not respond completely to balloon angioplasty necessitating the placement of overlapping self expanding stents across the right subclavian and axillary veins. Venous outflow is now widely patent after completion of the procedure. The graft itself was also successfully opened after thrombectomy with widely patent flow present.  ACCESS: The graft and venous outflow remain amenable to percutaneous intervention.   Electronically Signed   By: Aletta Edouard M.D.   On: 10/12/2019 12:04  DG Toe 3rd Right  Result Date: 10/14/2019 CLINICAL DATA:  Ulcer to the third toe EXAM: RIGHT THIRD TOE COMPARISON:  None. FINDINGS: Bones appear demineralized.  There are vascular calcifications. No fracture. Second and third toe distal soft tissue ulcers. Bony destructive change involving the second and third distal phalanges suspicious for osteomyelitis. IMPRESSION: 1. Bony destructive change involving the second and third distal phalanges suspicious for osteomyelitis. Second and third toe distal soft tissue ulcers. Electronically Signed   By: Donavan Foil M.D.   On: 10/14/2019 18:06   IR THROMBECTOMY AV FISTULA W/THROMBOLYSIS/PTA/STENT INC SHUNT/IMG RT  Result Date: 10/12/2019 CLINICAL DATA:  Occluded right upper arm loop dialysis graft. EXAM: 1. ULTRASOUND GUIDANCE FOR  VASCULAR ACCESS OF DIALYSIS FISTULA. 2. DIALYSIS FISTULA DECLOT PROCEDURE WITH TWO SEPARATE GRAFT ACCESS SITES. 3. VENOUS ANGIOPLASTY OF RIGHT AXILLARY AND SUBCLAVIAN VEIN 4. INTRAVASCULAR STENT PLACEMENT IN RIGHT SUBCLAVIAN AND AXILLARY VEINS ANESTHESIA/SEDATION: 2.5 mg IV Versed; 175 mcg IV Fentanyl. Total Moderate Sedation Time 138 minutes The patient's level of consciousness and physiologic status were continuously monitored during the procedure by Radiology nursing. CONTRAST:  90 mL Omnipaque 300 MEDICATIONS: 3 mg tPA, 4000 U IV heparin FLUOROSCOPY TIME:  19 minutes and 30 seconds.  322 mGy. PROCEDURE: The procedure, risks, benefits, and alternatives were explained to the patient. Questions regarding the procedure were encouraged and answered. The patient understands and consents to the procedure. A time-out was performed prior to initiating the procedure. The right upper arm dialysis graft was prepped with chlorhexidine in a sterile fashion, and a sterile drape was applied covering the operative field. A sterile gown and sterile gloves were used for the procedure. Local anesthesia was provided with 1% Lidocaine. Preliminary ultrasound was performed of the dialysis graft. Both antegrade and retrograde graft access was performed with micropuncture sets under direct ultrasound guidance. Ultrasound  image documentation was performed. t-PA was instilled via each access. 6-French antegrade and retrograde sheaths were placed. A diagnostic catheter was advanced and contrast injection performed at the level of the right axillary vein. Outflow venography was performed via the catheter. Catheter access was further advanced over a guidewire to the level of the right subclavian vein. Subclavian stenosis/occlusion was crossed with a hydrophilic guidewire. Balloon angioplasty of the right subclavian vein was performed with a 6 mm x 4 cm Mustang balloon. Mechanical thrombectomy was performed within the dialysis graft as well as at the level of venous outflow, the axillary vein and subclavian vein with the Angiojet device. Thrombectomy across the arterial anastomosis was then performed with a 4-French Fogarty balloon catheter. Several passes were made with the Fogarty catheter. Suction thrombectomy was then performed through the antegrade sheath. Graft patency was reassessed with angiography. Additional balloon angioplasty was performed within the dialysis graft and in venous outflow with the 6 mm x 4 cm Mustang balloon. Additional tPA was injected into thrombus within the right axillary vein via the Angiojet catheter and allowed to dwell for several minutes. Additional mechanical thrombectomy was performed with the Angiojet device. Additional balloon dilatation was performed at the level of the right axillary and subclavian veins with an 8 mm x 4 cm Conquest balloon. Additional balloon dilatation was performed within the dialysis graft and across the venous anastomosis of the dialysis graft with a 7 mm x 4 cm Conquest balloon. Angiography was performed to assess patency of the graft and venous outflow. Intravascular stent placement was performed at the level of the right subclavian vein with deployment of a 10 mm x 60 mm self expanding Smart stent. The stent was post dilated with a 10 mm balloon. The 10 mm balloon was also  used to dilate the right axillary vein. A second 10 mm x 60 mm self expanding Epic stent was placed in the right axillary vein and overlapped into the right subclavian vein stent. Additional angiography was performed to assess stent patency. Upon completion of the procedure, both sheaths were removed and hemostasis obtained with application of Ethilon pursestring sutures. COMPLICATIONS: None FINDINGS: Ultrasound confirms thrombosis of the graft. After graft access, advancement of a 5 French catheter into venous outflow was difficult. Venography demonstrates a cast of thrombus in the axillary vein and complete occlusion of outflow at  the level of the axillo-subclavian junction near the rib margin. A critical occlusive stenosis was encountered of the medial right subclavian vein as it passes under the medial clavicle. This was difficult to cross but was eventually able to be crossed with a hydrophilic guidewire. After preliminary dilatation, graft patency was re-established after mechanical thrombectomy and Fogarty balloon thrombectomy across the arterial anastomosis. There remains significant thrombus in venous outflow within the axillary vein which improved but was difficult to satisfactorily clear with mechanical thrombectomy and balloon angioplasty. 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 After right subclavian vein intravascular stent placement and dilatation, the subclavian vein showed excellent patency with no residual stenosis. There remained significant irregular stenosis of the right axillary vein after mechanical thrombectomy and balloon angioplasty. A second intravascular stent was therefore placed overlapping the first stent and extending further into the right axillary vein with excellent result and patency after placement. The right axillary vein is normally patent after stent  deployment. 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 This thrombus was able to be largely cleared by mechanical thrombectomy and underlying venous stenosis and irregularity did not respond completely to balloon angioplasty necessitating the placement of overlapping self expanding stents across the right subclavian and axillary veins. Venous outflow is now widely patent after completion of the procedure. The graft itself was also successfully opened after thrombectomy with widely patent flow present. ACCESS: The graft and venous outflow remain amenable to percutaneous intervention. Electronically Signed   By: Aletta Edouard M.D.   On: 10/12/2019 12:04     Assessment/Plan Principal Problem:   Osteomyelitis of third toe of right foot ([place]) X-ray right foot is positive for bony destruction involving second and third distal phalanges suspicious of osteomyelitis.  Continue IV vancomycin and IV cefepime.  Blood cultures ordered.  Podiatry consult ordered for evaluation and further recommendations.  Tylenol as needed for mild pain and fever while IV morphine 2 mg every 4 hours as needed for moderate and severe pain ordered.  Active Problems: Osteomyelitis of second toe of right foot See above    End stage renal disease on dialysis [facility]) Patient is on hemodialysis MWF.  Nephrology consult ordered for evaluation and arrangement of dialysis.    Obesity Patient counseled about adopting healthy lifestyle by eating low carbohydrate diet and regular walk.    Hyperglycemia due to diabetes mellitus (HCC) Moderate dose sliding scale insulin ordered. Blood glucose monitoring and hypoglycemic protocol in  place.  Hypertension Blood pressure is stable at this time Continue home medications  Hyperlipidemia Continue home Lipitor     DVT prophylaxis: Heparin Code Status: Full code Family Communication: No family member present at the bedside Disposition Plan:  Consults called: Podiatry and nephrology Admission status: Inpatient/MedSurg   Edmonia Lynch MD [facility] Pager [phone]   If 7PM-7AM, please contact night-coverage [email or web address] Password   10/15/2019, 6:27 AM

## 2019-10-15 NOTE — Plan of Care (Signed)
  Problem: Education: Goal: Knowledge of General Education information will improve Description: Including pain rating scale, medication(s)/side effects and non-pharmacologic comfort measures Outcome: Progressing   Problem: Health Behavior/Discharge Planning: Goal: Ability to manage health-related needs will improve Outcome: Progressing   Problem: Clinical Measurements: Goal: Ability to maintain clinical measurements within normal limits will improve Outcome: Progressing Goal: Will remain free from infection Outcome: Progressing Goal: Diagnostic test results will improve Outcome: Progressing   Problem: Safety: Goal: Ability to remain free from injury will improve Outcome: Progressing   Problem: Skin Integrity: Goal: Risk for impaired skin integrity will decrease Outcome: Progressing

## 2019-10-15 NOTE — Care Management Obs Status (Signed)
[facility] NOTIFICATION   Patient Details  Name: Christy Zhang MRN: [id number] Date of Birth: [birth year]   Medicare Observation Status Notification Given:  Yes    Boneta Lucks, RN 10/15/2019, 1:38 PM

## 2019-10-15 NOTE — TOC Transition Note (Addendum)
Transition of Care [facility]) - CM/SW Discharge Note   Patient Details  Name: Christy Zhang MRN: [id number] Date of Birth: 1956/04/19  [facility] CM/SW Contact:  Boneta Lucks, RN Phone Number: 10/15/2019, 1:24 PM   Clinical Narrative:   Patient admitted with osteomyelitis. Dialysis patient from [facility]. Patient will need to continue antibiotics with dialysis at discharge. TOC called Bonnie at [facility] in [place] to update with discharge plan. TOC to fax dc summary and order to [facility] fax number [phone].  Addendum - faxed OPAT orders to [facility]    Barriers to Discharge: Barriers Resolved   Patient Goals and CMS Choice Patient states their goals for this hospitalization and ongoing recovery are:: to go home. CMS Medicare.gov Compare Post Acute Care list provided to:: Patient Choice offered to / list presented to : Patient               Discharge Plan and Services               HOme with self care - return to [facility] for dialysis

## 2019-10-15 NOTE — Procedures (Signed)
I was present at this dialysis session. I have reviewed the session itself and made appropriate changes.  Rockwell Alexandria, RN was able to cannulate AVG without issue.  Will set goal uf of 2.5l as she was 2 kg above edw yesterday but below edw this am.   Vital signs in last 24 hours:  Temp:  [98.5 F (36.9 C)-98.9 F (37.2 C)] 98.8 F (37.1 C) (06/18 0424) Pulse Rate:  [74-83] 74 (06/18 0424) Resp:  [16] 16 (06/18 0424) BP: (147-186)/(53-147) 147/53 (06/18 0424) SpO2:  [92 %-100 %] 100 % (06/18 0424) Weight:  [110.2 kg-112 kg] 110.2 kg (06/17 2307) Weight change:  Filed Weights   10/14/19 1545 10/14/19 2307  Weight: 112 kg 110.2 kg    Recent Labs  Lab 10/15/19 0705  NA 137  K 3.6  CL 95*  CO2 27  GLUCOSE 104*  BUN 29*  CREATININE 9.29*  CALCIUM 8.5*    Recent Labs  Lab 10/14/19 1746 10/15/19 0705  WBC 15.0* 12.4*  NEUTROABS 10.8*  --   HGB 11.2* 9.9*  HCT 35.2* 31.1*  MCV 85.9 85.9  PLT 463* 415*    Scheduled Meds: . amLODipine  5 mg Oral QHS  . atorvastatin  40 mg Oral Daily  . Chlorhexidine Gluconate Cloth  6 each Topical Q0600  . heparin  5,000 Units Subcutaneous Q8H  . insulin aspart  0-15 Units Subcutaneous TID WC  . insulin aspart  0-5 Units Subcutaneous QHS  . metoprolol succinate  100 mg Oral QHS  . sevelamer carbonate  2,400 mg Oral TID with meals   Continuous Infusions: . ceFEPime (MAXIPIME) IV    . vancomycin     PRN Meds:.acetaminophen **OR** acetaminophen, morphine injection, ondansetron **OR** ondansetron (ZOFRAN) IV, sevelamer carbonate   Donetta Potts,  MD 10/15/2019, 10:46 AM

## 2019-10-15 NOTE — Progress Notes (Signed)
Patient is discharged home  with family, discharge instruction given to patient. Medications and appointments reviewed with patient. Patient verbalized understanding.

## 2019-10-15 NOTE — Care Management CC44 (Signed)
Condition Code 44 Documentation Completed  Patient Details  Name: Corene Resnick MRN: [id number] Date of Birth: 22-Mar-1956   Condition Code 44 given:  Yes Patient signature on Condition Code 44 notice:  Yes Documentation of 2 MD's agreement:  Yes Code 44 added to claim:  Yes    Boneta Lucks, RN 10/15/2019, 1:38 PM

## 2019-10-15 NOTE — Procedures (Signed)
     HEMODIALYSIS TREATMENT NOTE:   RUA AV loop graft was easily cannulated and needle lines flushed without resistance. Retrograde flow was confirmed. VP 260 with Qb 325 from treatment onset.  No infiltration or reported stinging.  I confirmed with her outpatient HD charge nurse that venous pressures "always run high" and Qb is usually decreased as a result, even after recent stenting.  Unable to update/modify hemodialysis treatment orders as verbally received from Dr. Marval Regal due to original order "completion" resulting from his procedure note.  The following changes, however, were implemented:  Qd 800, 3K/2.5Ca dialysate, Qb to 350 as tolerated while keeping VP<260, UF goal 2.5 liters.  Uneventful 4 hour low-heparin treatment completed.  Vancomycin and Cefipime were given at end of treatment.  Average Qb 320cc/m.  Goal met: 2.6 liters removed without interruption in UF.  All blood was returned and hemostasis was achieved in 15 minutes.   Rockwell Alexandria, RN

## 2019-10-15 NOTE — Consult Note (Signed)
[facility] Renal Consultation Note    Indication for Consultation:  Management of ESRD/hemodialysis; anemia, hypertension/volume and secondary hyperparathyroidism  HPI: Christy Zhang is a 64 y.o. female with a PMH significant for DM type II, HTN, CHF, HLD, and ESRD who presented to [facility] ED from podiatrist office for infection of her right toe.  She noticed that her 3rd right toe was getting dark and was evaluated by Podiatry who probed her wound and was concerned about osteomyelitis and was sent to the ED.  Xray showed bony destruction of the second and third distal phalanxes suspicious for osteo and was started on IV vanc/cefepime.  We were consulted to provide HD during her hospitalization.    She denies any F/C/N/V/D/SOB or lower extremity edema.  She did have a thrombectomy of her AVG on 10/11/19 and still has sutures which were to be removed today at her HD unit.   Past Medical History:  Diagnosis Date  . Anemia   . Arthritis   . Asthma    in the past  . CHF (congestive heart failure) ([place])   . Chronic kidney disease    Renal failure, dialysis on M/W/F  . 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 End stage renal disease ([place])   . End stage renal disease on dialysis ([place])   . Hemodialysis access, arteriovenous graft ([place])   . Hyperlipidemia   . Hypertension   . Neuromuscular disorder [facility])    Past Surgical History:  Procedure Laterality Date  . A/V FISTULAGRAM Right 02/04/2017   Procedure: A/V Fistulagram;  Surgeon: Waynetta Sandy, MD;  Location: [place] [facility];  Service: Cardiovascular;  Laterality: Right;  . AV FISTULA PLACEMENT Right 08/25/2014   Procedure: RIGHT BRACHIOCEPHALIC ARTERIOVENOUS (AV) FISTULA CREATION;  Surgeon: Mal Misty, MD;  Location: [facility] OR;  Service: Vascular;  Laterality: Right;  . AV FISTULA PLACEMENT Right 03/18/2019   Procedure: INSERTION OF ARTERIOVENOUS (AV) GORE-TEX GRAFT RIGHT ARM;  Surgeon:  Serafina Mitchell, MD;  Location: [place];  Service: Vascular;  Laterality: Right;  . BASCILIC VEIN TRANSPOSITION Right 12/24/2018   Procedure: FIRST STAGE BASILIC VEIN TRANSPOSITION RIGHT UPPER ARM;  Surgeon: Serafina Mitchell, MD;  Location: [place];  Service: Vascular;  Laterality: Right;  . FISTULA SUPERFICIALIZATION Right 11/03/2014   Procedure: RIGHT UPPER ARM FISTULA SUPERFICIALIZATION;  Surgeon: Mal Misty, MD;  Location: [place];  Service: Vascular;  Laterality: Right;  . FISTULOGRAM Right 01/16/2016   Procedure: RIGHT UPPER EXTREMITY ARTERIOVENOUS FISTULOGRAM WITH BALLOON ANGIOPLASTY;  Surgeon: Vickie Epley, MD;  Location: [facility];  Service: Vascular;  Laterality: Right;  . INSERTION OF DIALYSIS CATHETER     X4  . IR FLUORO GUIDE CV LINE RIGHT  12/03/2018  . IR REMOVAL TUN CV CATH W/O FL  07/06/2019  . IR THROMBECTOMY AV FISTULA W/THROMBOLYSIS/PTA INC/SHUNT/IMG RIGHT Right 12/03/2018  . IR THROMBECTOMY AV FISTULA W/THROMBOLYSIS/PTA/STENT INC/SHUNT/IMG RT Right 10/11/2019  . IR US GUIDE VASC ACCESS RIGHT  12/03/2018  . IR US GUIDE VASC ACCESS RIGHT  12/03/2018  . IR US GUIDE VASC ACCESS RIGHT  10/14/2019  . LIGATION OF COMPETING BRANCHES OF ARTERIOVENOUS FISTULA Right 11/03/2014   Procedure: LIGATION OF COMPETING BRANCHE x1 OF RIGHT UPPER ARM ARTERIOVENOUS FISTULA;  Surgeon: Mal Misty, MD;  Location: [place];  Service: Vascular;  Laterality: Right;  . PERIPHERAL VASCULAR BALLOON ANGIOPLASTY Right 02/04/2017   Procedure: PERIPHERAL VASCULAR BALLOON ANGIOPLASTY;  Surgeon: Waynetta Sandy,  MD;  Location: [place] [facility];  Service: Cardiovascular;  Laterality: Right;  . TUBAL LIGATION     Family History:   Family History  Problem Relation Age of Onset  . Diabetes Mother   . Hypertension Mother   . Cancer Father   . Diabetes Father   . Hypertension Sister   . Diabetes Daughter   . Heart disease Daughter   . Hypertension Daughter   . Diabetes Son   . Heart disease Son   .  Hypertension Son   . Peripheral vascular disease Son        amputation   Social History:  reports that she quit smoking about 35 years ago. Her smoking use included cigarettes. She has never used smokeless tobacco. She reports that she does not drink alcohol and does not use drugs. Allergies  Allergen Reactions  . Sulfa Antibiotics Swelling   Prior to Admission medications   Medication Sig Start Date End Date Taking? Authorizing Provider  amLODipine (NORVASC) 5 MG tablet Take 1 tablet (5 mg total) by mouth at bedtime. 08/17/19  Yes Claretta Fraise, MD  atorvastatin (LIPITOR) 40 MG tablet Take 1 tablet (40 mg total) by mouth daily. 08/17/19  Yes Claretta Fraise, MD  Dulaglutide (TRULICITY) 1.5 OA/4.1YS SOPN Inject content of one pen under the skin weekly on Saturday Patient taking differently: Inject 1.5 mg into the skin once a week.  08/17/19  Yes [name], [name], MD  insulin glargine, 1 Unit Dial, (TOUJEO SOLOSTAR) 300 UNIT/ML Solostar Pen Inject 100 Units into the skin daily. Patient taking differently: Inject 100 Units into the skin in the morning.  08/17/19  Yes [name], [name], MD  metoprolol succinate (TOPROL-XL) 100 MG 24 hr tablet TAKE ONE TABLET BY MOUTH DAILY. TAKE WITH OR IMMEDIATELY FOLLOWING A MEAL. Patient taking differently: Take 100 mg by mouth at bedtime. [place] Kitchen TAKE WITH OR IMMEDIATELY FOLLOWING A MEAL. 08/17/19  Yes [name], [name], MD  multivitamin (RENA-VIT) TABS tablet Take 1 tablet by mouth at bedtime.    Yes [provider]  sevelamer carbonate (RENVELA) 800 MG tablet Take 1,600-2,400 mg by mouth See admin instructions. Take 2400 mg with each meal and 1600 mg with each snack   Yes [provider]   Current Facility-Administered Medications  Medication Dose Route Frequency Provider Last Rate Last Admin  . acetaminophen (TYLENOL) tablet 650 mg  650 mg Oral Q6H PRN Bunnie Pion Z, DO       Or  . acetaminophen (TYLENOL) suppository 650 mg  650 mg Rectal Q6H PRN  Bunnie Pion Z, DO      . amLODipine (NORVASC) tablet 5 mg  5 mg Oral QHS Bunnie Pion Z, DO      . atorvastatin (LIPITOR) tablet 40 mg  40 mg Oral Daily Bunnie Pion Z, DO   40 mg at 10/15/19 0630  . ceFEPIme (MAXIPIME) 2 g in sodium chloride 0.9 % 100 mL IVPB  2 g Intravenous Q M,W,F-HD Delo, Douglas, MD      . Chlorhexidine Gluconate Cloth 2 % PADS 6 each  6 each Topical Q0600 Donato Heinz, MD      . heparin injection 5,000 Units  5,000 Units Subcutaneous Q8H Bunnie Pion Z, DO   5,000 Units at 10/15/19 [phone]  . insulin aspart (novoLOG) injection 0-15 Units  0-15 Units Subcutaneous TID WC Bunnie Pion Z, DO      . insulin aspart (novoLOG) injection 0-5 Units  0-5 Units Subcutaneous QHS Bunnie Pion Z, DO      .  metoprolol succinate (TOPROL-XL) 24 hr tablet 100 mg  100 mg Oral QHS Bunnie Pion Z, DO      . morphine 2 MG/ML injection 2 mg  2 mg Intravenous Q4H PRN Bunnie Pion Z, DO      . ondansetron [facility]) tablet 4 mg  4 mg Oral Q6H PRN Bunnie Pion Z, DO       Or  . ondansetron [facility]) injection 4 mg  4 mg Intravenous Q6H PRN Bunnie Pion Z, DO      . sevelamer carbonate (RENVELA) tablet 1,600 mg  1,600 mg Oral PRN Emokpae, Courage, MD      . sevelamer carbonate (RENVELA) tablet 2,400 mg  2,400 mg Oral TID with meals Bunnie Pion Z, DO   2,400 mg at 10/15/19 0900  . vancomycin (VANCOCIN) IVPB 1000 mg/200 mL premix  1,000 mg Intravenous Q M,W,F-HD Veryl Speak, MD       Labs: Basic Metabolic Panel: Recent Labs  Lab 10/14/19 1746 10/15/19 0705  NA 134* 137  K 3.7 3.6  CL 92* 95*  CO2 27 27  GLUCOSE 144* 104*  BUN 25* 29*  CREATININE 8.04* 9.29*  CALCIUM 8.6* 8.5*   Liver Function Tests: Recent Labs  Lab 10/14/19 1746  AST 10*  ALT 9  ALKPHOS 62  BILITOT 0.7  PROT 8.6*  ALBUMIN 3.7   No results for input(s): LIPASE, AMYLASE in the last 168 hours. No results for input(s): AMMONIA in the last 168 hours. CBC: Recent Labs  Lab 10/14/19 1746  10/15/19 0705  WBC 15.0* 12.4*  NEUTROABS 10.8*  --   HGB 11.2* 9.9*  HCT 35.2* 31.1*  MCV 85.9 85.9  PLT 463* 415*   Cardiac Enzymes: No results for input(s): CKTOTAL, CKMB, CKMBINDEX, TROPONINI in the last 168 hours. CBG: Recent Labs  Lab 10/11/19 1155 10/14/19 2309 10/15/19 0806  GLUCAP 79 111* 100*   Iron Studies: No results for input(s): IRON, TIBC, TRANSFERRIN, FERRITIN in the last 72 hours. Studies/Results: IR US Guide Vasc Access Right  Result Date: 10/14/2019 CLINICAL DATA:  Occluded right upper arm loop dialysis graft. EXAM: 1. ULTRASOUND GUIDANCE FOR VASCULAR ACCESS OF DIALYSIS FISTULA. 2. DIALYSIS FISTULA DECLOT PROCEDURE WITH TWO SEPARATE GRAFT ACCESS SITES. 3. VENOUS ANGIOPLASTY OF RIGHT AXILLARY AND SUBCLAVIAN VEIN 4. INTRAVASCULAR STENT PLACEMENT IN RIGHT SUBCLAVIAN AND AXILLARY VEINS ANESTHESIA/SEDATION: 2.5 mg IV Versed; 175 mcg IV Fentanyl. Total Moderate Sedation Time 138 minutes The patient's level of consciousness and physiologic status were continuously monitored during the procedure by Radiology nursing. CONTRAST:  90 mL Omnipaque 300 MEDICATIONS: 3 mg tPA, 4000 U IV heparin FLUOROSCOPY TIME:  19 minutes and 30 seconds.  322 mGy. PROCEDURE: The procedure, risks, benefits, and alternatives were explained to the patient. Questions regarding the procedure were encouraged and answered. The patient understands and consents to the procedure. A time-out was performed prior to initiating the procedure. The right upper arm dialysis graft was prepped with chlorhexidine in a sterile fashion, and a sterile drape was applied covering the operative field. A sterile gown and sterile gloves were used for the procedure. Local anesthesia was provided with 1% Lidocaine. Preliminary ultrasound was performed of the dialysis graft. Both antegrade and retrograde graft access was performed with micropuncture sets under direct ultrasound guidance. Ultrasound image documentation was performed.  t-PA was instilled via each access. 6-French antegrade and retrograde sheaths were placed. A diagnostic catheter was advanced and contrast injection performed at the level of the right axillary vein. Outflow venography was performed  via the catheter. Catheter access was further advanced over a guidewire to the level of the right subclavian vein. Subclavian stenosis/occlusion was crossed with a hydrophilic guidewire. Balloon angioplasty of the right subclavian vein was performed with a 6 mm x 4 cm Mustang balloon. Mechanical thrombectomy was performed within the dialysis graft as well as at the level of venous outflow, the axillary vein and subclavian vein with the Angiojet device. Thrombectomy across the arterial anastomosis was then performed with a 4-French Fogarty balloon catheter. Several passes were made with the Fogarty catheter. Suction thrombectomy was then performed through the antegrade sheath. Graft patency was reassessed with angiography. Additional balloon angioplasty was performed within the dialysis graft and in venous outflow with the 6 mm x 4 cm Mustang balloon. Additional tPA was injected into thrombus within the right axillary vein via the Angiojet catheter and allowed to dwell for several minutes. Additional mechanical thrombectomy was performed with the Angiojet device. Additional balloon dilatation was performed at the level of the right axillary and subclavian veins with an 8 mm x 4 cm Conquest balloon. Additional balloon dilatation was performed within the dialysis graft and across the venous anastomosis of the dialysis graft with a 7 mm x 4 cm Conquest balloon. Angiography was performed to assess patency of the graft and venous outflow. Intravascular stent placement was performed at the level of the right subclavian vein with deployment of a 10 mm x 60 mm self expanding Smart stent. The stent was post dilated with a 10 mm balloon. The 10 mm balloon was also used to dilate the right axillary  vein. A second 10 mm x 60 mm self expanding Epic stent was placed in the right axillary vein and overlapped into the right subclavian vein stent. Additional angiography was performed to assess stent patency. Upon completion of the procedure, both sheaths were removed and hemostasis obtained with application of Ethilon pursestring sutures. COMPLICATIONS: None FINDINGS: Ultrasound confirms thrombosis of the graft. After graft access, advancement of a 5 French catheter into venous outflow was difficult. Venography demonstrates a cast of thrombus in the axillary vein and complete occlusion of outflow at the level of the axillo-subclavian junction near the rib margin. A critical occlusive stenosis was encountered of the medial right subclavian vein as it passes under the medial clavicle. This was difficult to cross but was eventually able to be crossed with a hydrophilic guidewire. After preliminary dilatation, graft patency was re-established after mechanical thrombectomy and Fogarty balloon thrombectomy across the arterial anastomosis. There remains significant thrombus in venous outflow within the axillary vein which improved but was difficult to satisfactorily clear with mechanical thrombectomy and balloon angioplasty. 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 After right subclavian vein intravascular stent placement and dilatation, the subclavian vein showed excellent patency with no residual stenosis. There remained significant irregular stenosis of the right axillary vein after mechanical thrombectomy and balloon angioplasty. A second intravascular stent was therefore placed overlapping the first stent and extending further into the right axillary vein with excellent result and patency after placement. The right axillary vein is normally patent after stent deployment. IMPRESSION: Successful  declot procedure to reestablish flow in an occluded right upper arm loop dialysis graft. 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 This thrombus was able to  be largely cleared by mechanical thrombectomy and underlying venous stenosis and irregularity did not respond completely to balloon angioplasty necessitating the placement of overlapping self expanding stents across the right subclavian and axillary veins. Venous outflow is now widely patent after completion of the procedure. The graft itself was also successfully opened after thrombectomy with widely patent flow present. ACCESS: The graft and venous outflow remain amenable to percutaneous intervention. Electronically Signed   By: Aletta Edouard M.D.   On: 10/12/2019 12:04   DG Toe 3rd Right  Result Date: 10/14/2019 CLINICAL DATA:  Ulcer to the third toe EXAM: RIGHT THIRD TOE COMPARISON:  None. FINDINGS: Bones appear demineralized. There are vascular calcifications. No fracture. Second and third toe distal soft tissue ulcers. Bony destructive change involving the second and third distal phalanges suspicious for osteomyelitis. IMPRESSION: 1. Bony destructive change involving the second and third distal phalanges suspicious for osteomyelitis. Second and third toe distal soft tissue ulcers. Electronically Signed   By: Donavan Foil M.D.   On: 10/14/2019 18:06   IR THROMBECTOMY AV FISTULA W/THROMBOLYSIS/PTA/STENT INC SHUNT/IMG RT  Result Date: 10/12/2019 CLINICAL DATA:  Occluded right upper arm loop dialysis graft. EXAM: 1. ULTRASOUND GUIDANCE FOR VASCULAR ACCESS OF DIALYSIS FISTULA. 2. DIALYSIS FISTULA DECLOT PROCEDURE WITH TWO SEPARATE GRAFT ACCESS SITES. 3. VENOUS ANGIOPLASTY OF RIGHT AXILLARY AND SUBCLAVIAN VEIN 4. INTRAVASCULAR STENT PLACEMENT IN RIGHT SUBCLAVIAN AND AXILLARY  VEINS ANESTHESIA/SEDATION: 2.5 mg IV Versed; 175 mcg IV Fentanyl. Total Moderate Sedation Time 138 minutes The patient's level of consciousness and physiologic status were continuously monitored during the procedure by Radiology nursing. CONTRAST:  90 mL Omnipaque 300 MEDICATIONS: 3 mg tPA, 4000 U IV heparin FLUOROSCOPY TIME:  19 minutes and 30 seconds.  322 mGy. PROCEDURE: The procedure, risks, benefits, and alternatives were explained to the patient. Questions regarding the procedure were encouraged and answered. The patient understands and consents to the procedure. A time-out was performed prior to initiating the procedure. The right upper arm dialysis graft was prepped with chlorhexidine in a sterile fashion, and a sterile drape was applied covering the operative field. A sterile gown and sterile gloves were used for the procedure. Local anesthesia was provided with 1% Lidocaine. Preliminary ultrasound was performed of the dialysis graft. Both antegrade and retrograde graft access was performed with micropuncture sets under direct ultrasound guidance. Ultrasound image documentation was performed. t-PA was instilled via each access. 6-French antegrade and retrograde sheaths were placed. A diagnostic catheter was advanced and contrast injection performed at the level of the right axillary vein. Outflow venography was performed via the catheter. Catheter access was further advanced over a guidewire to the level of the right subclavian vein. Subclavian stenosis/occlusion was crossed with a hydrophilic guidewire. Balloon angioplasty of the right subclavian vein was performed with a 6 mm x 4 cm Mustang balloon. Mechanical thrombectomy was performed within the dialysis graft as well as at the level of venous outflow, the axillary vein and subclavian vein with the Angiojet device. Thrombectomy across the arterial anastomosis was then performed with a 4-French Fogarty balloon catheter. Several passes were made with the  Fogarty catheter. Suction thrombectomy was then performed through the antegrade sheath. Graft patency was reassessed with angiography. Additional balloon angioplasty was performed within the dialysis graft and in venous outflow with the 6 mm x 4 cm Mustang balloon. Additional tPA was injected into thrombus within the right axillary vein via the Angiojet catheter and allowed to dwell for several minutes. Additional mechanical thrombectomy was performed with  the Angiojet device. Additional balloon dilatation was performed at the level of the right axillary and subclavian veins with an 8 mm x 4 cm Conquest balloon. Additional balloon dilatation was performed within the dialysis graft and across the venous anastomosis of the dialysis graft with a 7 mm x 4 cm Conquest balloon. Angiography was performed to assess patency of the graft and venous outflow. Intravascular stent placement was performed at the level of the right subclavian vein with deployment of a 10 mm x 60 mm self expanding Smart stent. The stent was post dilated with a 10 mm balloon. The 10 mm balloon was also used to dilate the right axillary vein. A second 10 mm x 60 mm self expanding Epic stent was placed in the right axillary vein and overlapped into the right subclavian vein stent. Additional angiography was performed to assess stent patency. Upon completion of the procedure, both sheaths were removed and hemostasis obtained with application of Ethilon pursestring sutures. COMPLICATIONS: None FINDINGS: Ultrasound confirms thrombosis of the graft. After graft access, advancement of a 5 French catheter into venous outflow was difficult. Venography demonstrates a cast of thrombus in the axillary vein and complete occlusion of outflow at the level of the axillo-subclavian junction near the rib margin. A critical occlusive stenosis was encountered of the medial right subclavian vein as it passes under the medial clavicle. This was difficult to cross but was  eventually able to be crossed with a hydrophilic guidewire. After preliminary dilatation, graft patency was re-established after mechanical thrombectomy and Fogarty balloon thrombectomy across the arterial anastomosis. There remains significant thrombus in venous outflow within the axillary vein which improved but was difficult to satisfactorily clear with mechanical thrombectomy and balloon angioplasty. 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 After right subclavian vein intravascular stent placement and dilatation, the subclavian vein showed excellent patency with no residual stenosis. There remained significant irregular stenosis of the right axillary vein after mechanical thrombectomy and balloon angioplasty. A second intravascular stent was therefore placed overlapping the first stent and extending further into the right axillary vein with excellent result and patency after placement. The right axillary vein is normally patent after stent deployment. 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 This thrombus was able to be largely cleared by mechanical thrombectomy and underlying venous stenosis and irregularity did not respond completely to balloon angioplasty necessitating the placement of overlapping self expanding stents across the right subclavian and axillary veins. Venous outflow is now widely patent after completion of the procedure. The graft itself was also successfully opened after thrombectomy with widely patent flow present. ACCESS: The graft and venous outflow remain amenable to percutaneous  intervention. Electronically Signed   By: Aletta Edouard M.D.   On: 10/12/2019 12:04    ROS: Pertinent items are noted in HPI. Physical Exam: Vitals:   10/14/19 2100 10/14/19 2130 10/14/19 2307 10/15/19 0424  BP: (!) 151/58 (!) 170/147 (!) 166/66 (!) 147/53  Pulse: 74 77 82 74  Resp:   16 16  Temp:   98.9 F (37.2 C) 98.8 F (37.1 C)  TempSrc:   Oral   SpO2: 97% 98% 98% 100%  Weight:   110.2 kg  Height:   5\' 3"  (1.6 m)       Weight change:   Intake/Output Summary (Last 24 hours) at 10/15/2019 1030 Last data filed at 10/15/2019 0900 Gross per 24 hour  Intake 220 ml  Output --  Net 220 ml   BP (!) 147/53 (BP Location: Left Arm)   Pulse 74   Temp 98.8 F (37.1 C)   Resp 16   Ht 5\' 3"  (1.6 m)   Wt 110.2 kg   LMP  (LMP Unknown)   SpO2 100%   BMI 43.04 kg/m  General appearance: alert, cooperative, no distress and moderately obese Head: Normocephalic, without obvious abnormality, atraumatic Resp: clear to auscultation bilaterally Cardio: regular rate and rhythm, S1, S2 normal, no murmur, click, rub or gallop GI: soft, non-tender; bowel sounds normal; no masses,  no organomegaly Extremities: ulceration of right 3rd toe, no edema, RUE AVG +T/B with sutures in place from recent thrombectomy Dialysis Access:  Dialysis Orders: Center: [facility]  on MWF . EDW 110.5 kg HD Bath 2K/2.5Ca  Time 4 hours Heparin  1800. Access RUE AVG BFR 400 DFR 600    Hectoral 3.5 mcg IV/HD Epogen 1000   Units IV/HD  Venofer  50 mg IV q week    Assessment/Plan: 1.  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  Started vanco and cefepime.  Will need fortaz 2 grams with HD as an outpatient pending culture results (cefepime not available for outpatient HD).  Should have wound culture as well, blood cultures pending.  2.  ESRD -   Plan for HD today 3.  Hypertension/volume  -  Is at her edw will UF 2 liters and follow 4.  Anemia  - will dose aranesp while here 5.  Metabolic bone disease -   Continue with home meds 6.  Nutrition - renal diet, carb modified 7. DM type II- per primary. 8. AVG- s/p recent thrombectomy. 9. Disposition- will need 6 weeks of abx as an outpatient with HD.  Donetta Potts, MD [place] Pager [phone] 10/15/2019, 10:30 AM

## 2019-10-18 LAB — BLOOD CULTURE ID PANEL (REFLEXED)

## 2019-10-19 LAB — CULTURE, BLOOD (ROUTINE X 2)
Culture: NO GROWTH
Special Requests: ADEQUATE
Special Requests: ADEQUATE

## 2019-10-21 ENCOUNTER — Telehealth: Payer: Self-pay

## 2019-10-21 NOTE — Telephone Encounter (Signed)
Per d/c instructions from hospital pt f/u with podiatry. She states they want her to be seen by vascular for R third toe with ulcer. She is scheduled to see PA next week and is aware to call us back if anything changes/worsens between now and her appt. Pt verbalized understanding.

## 2019-10-28 ENCOUNTER — Other Ambulatory Visit: Payer: Self-pay

## 2019-10-28 ENCOUNTER — Ambulatory Visit (INDEPENDENT_AMBULATORY_CARE_PROVIDER_SITE_OTHER): Payer: Medicare Other | Admitting: Physician Assistant

## 2019-10-28 VITALS — BP 157/72 | HR 73 | Temp 97.5°F | Resp 20 | Ht 63.0 in | Wt 238.6 lb

## 2019-10-28 DIAGNOSIS — M869 Osteomyelitis, unspecified: Secondary | ICD-10-CM | POA: Diagnosis not present

## 2019-10-28 DIAGNOSIS — I739 Peripheral vascular disease, unspecified: Secondary | ICD-10-CM | POA: Diagnosis not present

## 2019-10-28 NOTE — Progress Notes (Signed)
History of Present Illness:  Patient is a 64 y.o. year old female who presents for evaluation of PAD with new diagnosis of right 3rd toe osteomyelitis. She has a prior history of right toe ulcer.  She was evaluated via telephone by Dr. Oneida Alar on May 6th, 2021 at request of Dr. Caryl Comes, her podiatrist. She had calcified vessels.  She did have a right toe pressure of 95.   Arrangements were made for 3 month surveillance follow-up.   Today the patient presents with new right third toe ulcer.  She is accompanied by her daughter.  She ambulates via wheelchair.  No rest pain, fever or chills.  The patient states she was informed to call our office for follow-up after recent hospitalization (10/14/2019) to [facility] for right 3rd toe osteomyelitis:  Primary MD  Claretta Fraise, MD  Recommendations for primary care physician for things to follow:   Give IV Fortaz and IV vancomycin after each hemodialysis sessions on Mondays Wednesdays and Fridays through 11/26/2019 for foot infection/bone infection/osteomyelitis Indication:  osteomyelitis First Dose: Yes Last Day of Therapy:  11/26/19 Labs - Once weekly:  CBC/D and BMP, Labs - Every other week:  ESR and CRP Method of administration: IV Push, after vancomycin has run on HD days  I asked her if she had any issues with her left foot and when she removes her sock, she has a small ischemic ulcer noted of the left third toe as well.  She denies drainage or bleeding from these ulcers.  She is on no anticoagulant medication and is a non-smoker.  Past medical history and medications are reviewed.  The patient is status post right upper arm AV loop graft by Dr. Trula Slade on March 18, 2019.  She denies any complications currently with dialysis.  No hand pain. She dialyzes on Mondays Wednesdays and Fridays at [facility] dialysis clinic. Past Medical History:  Diagnosis Date  . Anemia   . Arthritis   . Asthma    in the past  . CHF (congestive heart failure)  ([place])   . Chronic kidney disease    Renal failure, dialysis on M/W/F  . 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 End stage renal disease ([place])   . End stage renal disease on dialysis ([place])   . Hemodialysis access, arteriovenous graft ([place])   . Hyperlipidemia   . Hypertension   . Neuromuscular disorder [facility])     Past Surgical History:  Procedure Laterality Date  . A/V FISTULAGRAM Right 02/04/2017   Procedure: A/V Fistulagram;  Surgeon: Waynetta Sandy, MD;  Location: [place] [facility];  Service: Cardiovascular;  Laterality: Right;  . AV FISTULA PLACEMENT Right 08/25/2014   Procedure: RIGHT BRACHIOCEPHALIC ARTERIOVENOUS (AV) FISTULA CREATION;  Surgeon: Mal Misty, MD;  Location: [facility] OR;  Service: Vascular;  Laterality: Right;  . AV FISTULA PLACEMENT Right 03/18/2019   Procedure: INSERTION OF ARTERIOVENOUS (AV) GORE-TEX GRAFT RIGHT ARM;  Surgeon: Serafina Mitchell, MD;  Location: [place];  Service: Vascular;  Laterality: Right;  . BASCILIC VEIN TRANSPOSITION Right 12/24/2018   Procedure: FIRST STAGE BASILIC VEIN TRANSPOSITION RIGHT UPPER ARM;  Surgeon: Serafina Mitchell, MD;  Location: [place];  Service: Vascular;  Laterality: Right;  . FISTULA SUPERFICIALIZATION Right 11/03/2014   Procedure: RIGHT UPPER ARM FISTULA SUPERFICIALIZATION;  Surgeon: Mal Misty, MD;  Location: [place];  Service: Vascular;  Laterality: Right;  . FISTULOGRAM Right 01/16/2016   Procedure: RIGHT UPPER  EXTREMITY ARTERIOVENOUS FISTULOGRAM WITH BALLOON ANGIOPLASTY;  Surgeon: Vickie Epley, MD;  Location: [facility];  Service: Vascular;  Laterality: Right;  . INSERTION OF DIALYSIS CATHETER     X4  . IR FLUORO GUIDE CV LINE RIGHT  12/03/2018  . IR REMOVAL TUN CV CATH W/O FL  07/06/2019  . IR THROMBECTOMY AV FISTULA W/THROMBOLYSIS/PTA INC/SHUNT/IMG RIGHT Right 12/03/2018  . IR THROMBECTOMY AV FISTULA W/THROMBOLYSIS/PTA/STENT INC/SHUNT/IMG RT Right 10/11/2019  . IR US GUIDE VASC ACCESS RIGHT  12/03/2018  . IR  US GUIDE VASC ACCESS RIGHT  12/03/2018  . IR US GUIDE VASC ACCESS RIGHT  10/14/2019  . LIGATION OF COMPETING BRANCHES OF ARTERIOVENOUS FISTULA Right 11/03/2014   Procedure: LIGATION OF COMPETING BRANCHE x1 OF RIGHT UPPER ARM ARTERIOVENOUS FISTULA;  Surgeon: Mal Misty, MD;  Location: [place];  Service: Vascular;  Laterality: Right;  . PERIPHERAL VASCULAR BALLOON ANGIOPLASTY Right 02/04/2017   Procedure: PERIPHERAL VASCULAR BALLOON ANGIOPLASTY;  Surgeon: Waynetta Sandy, MD;  Location: [place] [facility];  Service: Cardiovascular;  Laterality: Right;  . TUBAL LIGATION      ROS:   General:  No weight loss, Fever, chills  HEENT: No recent headaches, no nasal bleeding, no visual changes, no sore throat  Neurologic: No dizziness, blackouts, seizures. No recent symptoms of stroke or mini- stroke. No recent episodes of slurred speech, or temporary blindness.  Cardiac: No recent episodes of chest pain/pressure, no shortness of breath at rest.  No shortness of breath with exertion.  Denies history of atrial fibrillation or irregular heartbeat  Vascular: No history of rest pain in feet.  No history of claudication.  No history of non-healing ulcer, No history of DVT   Pulmonary: No home oxygen, no productive cough, no hemoptysis,  No asthma or wheezing  Musculoskeletal:  _0  Arthritis, _1  Low back pain,  _2  Joint pain  Hematologic:No history of hypercoagulable state.  No history of easy bleeding.  No history of anemia  Gastrointestinal: No hematochezia or melena,  No gastroesophageal reflux, no trouble swallowing  Urinary: _3  chronic Kidney disease, _4  on HD - _5  MWF or _6  TTHS, _7  Burning with urination, _8  Frequent urination, _9  Difficulty urinating;   Skin: No rashes  Psychological: No history of anxiety,  No history of depression  Social History Social History   Tobacco Use  . Smoking status: Former Smoker    Types: Cigarettes    Quit date: 07/25/1984    Years since  quitting: 35.2  . Smokeless tobacco: Never Used  Vaping Use  . Vaping Use: Never used  Substance Use Topics  . Alcohol use: No    Alcohol/week: 0.0 standard drinks  . Drug use: No    Family History Family History  Problem Relation Age of Onset  . Diabetes Mother   . Hypertension Mother   . Cancer Father   . Diabetes Father   . Hypertension Sister   . Diabetes Daughter   . Heart disease Daughter   . Hypertension Daughter   . Diabetes Son   . Heart disease Son   . Hypertension Son   . Peripheral vascular disease Son        amputation    Allergies  Allergies  Allergen Reactions  . Sulfa Antibiotics Swelling     Current Outpatient Medications  Medication Sig Dispense Refill  . acetaminophen (TYLENOL) 325 MG tablet Take 2 tablets (650 mg total) by mouth every 6 (six) hours  as needed for mild pain (or Fever >/= 101). 12 tablet 0  . amLODipine (NORVASC) 5 MG tablet Take 1 tablet (5 mg total) by mouth at bedtime. 90 tablet 1  . atorvastatin (LIPITOR) 40 MG tablet Take 1 tablet (40 mg total) by mouth daily. 90 tablet 3  . cefTAZidime (FORTAZ) IVPB Inject 2 g into the vein every Monday, Wednesday, and Friday with hemodialysis. Indication:  osteomyelitis First Dose: Yes Last Day of Therapy:  11/26/19 Labs - Once weekly:  CBC/D and BMP, Labs - Every other week:  ESR and CRP Method of administration: IV Push, after vancomycin has run on HD days Method of administration may be changed at the discretion of home infusion pharmacist based upon assessment of the patient and/or caregiver's ability to self-administer the medication ordered. 18 Units 0  . Dulaglutide (TRULICITY) 1.5 TC/4.8LY SOPN Inject content of one pen under the skin weekly on Saturday (Patient taking differently: Inject 1.5 mg into the skin once a week. ) 4 pen 12  . insulin glargine, 1 Unit Dial, (TOUJEO SOLOSTAR) 300 UNIT/ML Solostar Pen Inject 100 Units into the skin daily. (Patient taking differently: Inject 100  Units into the skin in the morning. ) 10 mL 2  . metoprolol succinate (TOPROL-XL) 100 MG 24 hr tablet TAKE ONE TABLET BY MOUTH DAILY. TAKE WITH OR IMMEDIATELY FOLLOWING A MEAL. (Patient taking differently: Take 100 mg by mouth at bedtime. Marland Kitchen TAKE WITH OR IMMEDIATELY FOLLOWING A MEAL.) 90 tablet 1  . multivitamin (RENA-VIT) TABS tablet Take 1 tablet by mouth at bedtime.     . sevelamer carbonate (RENVELA) 800 MG tablet Take 1,600-2,400 mg by mouth See admin instructions. Take 2400 mg with each meal and 1600 mg with each snack    . vancomycin IVPB Inject 1,000 mg into the vein every Monday, Wednesday, and Friday with hemodialysis. Indication:  osteomyelitis First Dose: No Last Day of Therapy:  11/26/19 Labs - Sunday/Monday:  CBC/D, BMP, and vancomycin trough. Labs - Thursday:  BMP and vancomycin trough Labs - Every other week:  ESR and CRP Method of administration:Elastomeric Method of administration may be changed at the discretion of the patient and/or caregiver's ability to self-administer the medication ordered. 18 Units 0   No current facility-administered medications for this visit.    Physical Examination  Vitals:   10/28/19 0900  BP: (!) 157/72  Pulse: 73  Resp: 20  Temp: (!) 97.5 F (36.4 C)  SpO2: 96%   General:  Alert and oriented, no acute distress HEENT: Normal Pulmonary: Clear to auscultation bilaterally Cardiac: Regular Rate and Rhythm without murmur Skin: No rash Extremity Pulses: She has a palpable right anterior tibial pulse.  She has a peroneal Doppler signal but I am unable to obtain a posterior tibial signal. Ischemic ulcers noted of the distal ends of the right and left 3rd toes. Musculoskeletal: No deformity or edema  Neurologic: Upper and lower extremity motor 5/5 and symmetric        DATA:  CLINICAL DATA:  Ulcer to the third toe  EXAM: RIGHT THIRD TOE  COMPARISON:  None.  FINDINGS: Bones appear demineralized. There are vascular  calcifications. No fracture. Second and third toe distal soft tissue ulcers. Bony destructive change involving the second and third distal phalanges suspicious for osteomyelitis.  IMPRESSION: 1. Bony destructive change involving the second and third distal phalanges suspicious for osteomyelitis. Second and third toe distal soft tissue ulcers.   Electronically Signed   By: Madie Reno.D.  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 Ulcer of left 3rd toe also noted.  PLAN:  She will need arteriogram of the RLE and will need non-invasive studies to include the LLE.  Continue intravenous antibiotics as previously prescribed.  Betadine paint to right and left third toes daily.  The treatment plan is discussed with the patient and her daughter and their questions are answered.  The patient is in agreement with this plan.  Clinic MD: Weston Anna, PA-C [facility] Office: [phone] Pager: [phone]

## 2019-10-29 DIAGNOSIS — D631 Anemia in chronic kidney disease: Secondary | ICD-10-CM | POA: Diagnosis not present

## 2019-10-29 DIAGNOSIS — N186 End stage renal disease: Secondary | ICD-10-CM | POA: Diagnosis not present

## 2019-10-29 DIAGNOSIS — M86171 Other acute osteomyelitis, right ankle and foot: Secondary | ICD-10-CM | POA: Diagnosis not present

## 2019-10-29 DIAGNOSIS — Z992 Dependence on renal dialysis: Secondary | ICD-10-CM | POA: Diagnosis not present

## 2019-10-29 DIAGNOSIS — D509 Iron deficiency anemia, unspecified: Secondary | ICD-10-CM | POA: Diagnosis not present

## 2019-10-29 DIAGNOSIS — N2581 Secondary hyperparathyroidism of renal origin: Secondary | ICD-10-CM | POA: Diagnosis not present

## 2019-11-01 DIAGNOSIS — M86171 Other acute osteomyelitis, right ankle and foot: Secondary | ICD-10-CM | POA: Diagnosis not present

## 2019-11-01 DIAGNOSIS — N186 End stage renal disease: Secondary | ICD-10-CM | POA: Diagnosis not present

## 2019-11-01 DIAGNOSIS — D631 Anemia in chronic kidney disease: Secondary | ICD-10-CM | POA: Diagnosis not present

## 2019-11-01 DIAGNOSIS — N2581 Secondary hyperparathyroidism of renal origin: Secondary | ICD-10-CM | POA: Diagnosis not present

## 2019-11-01 DIAGNOSIS — Z992 Dependence on renal dialysis: Secondary | ICD-10-CM | POA: Diagnosis not present

## 2019-11-01 DIAGNOSIS — D509 Iron deficiency anemia, unspecified: Secondary | ICD-10-CM | POA: Diagnosis not present

## 2019-11-03 ENCOUNTER — Other Ambulatory Visit: Payer: Self-pay

## 2019-11-03 DIAGNOSIS — D509 Iron deficiency anemia, unspecified: Secondary | ICD-10-CM | POA: Diagnosis not present

## 2019-11-03 DIAGNOSIS — N2581 Secondary hyperparathyroidism of renal origin: Secondary | ICD-10-CM | POA: Diagnosis not present

## 2019-11-03 DIAGNOSIS — N186 End stage renal disease: Secondary | ICD-10-CM | POA: Diagnosis not present

## 2019-11-03 DIAGNOSIS — Z992 Dependence on renal dialysis: Secondary | ICD-10-CM | POA: Diagnosis not present

## 2019-11-03 DIAGNOSIS — M86171 Other acute osteomyelitis, right ankle and foot: Secondary | ICD-10-CM | POA: Diagnosis not present

## 2019-11-03 DIAGNOSIS — D631 Anemia in chronic kidney disease: Secondary | ICD-10-CM | POA: Diagnosis not present

## 2019-11-05 DIAGNOSIS — Z992 Dependence on renal dialysis: Secondary | ICD-10-CM | POA: Diagnosis not present

## 2019-11-05 DIAGNOSIS — N2581 Secondary hyperparathyroidism of renal origin: Secondary | ICD-10-CM | POA: Diagnosis not present

## 2019-11-05 DIAGNOSIS — N186 End stage renal disease: Secondary | ICD-10-CM | POA: Diagnosis not present

## 2019-11-05 DIAGNOSIS — D509 Iron deficiency anemia, unspecified: Secondary | ICD-10-CM | POA: Diagnosis not present

## 2019-11-05 DIAGNOSIS — D631 Anemia in chronic kidney disease: Secondary | ICD-10-CM | POA: Diagnosis not present

## 2019-11-05 DIAGNOSIS — M86171 Other acute osteomyelitis, right ankle and foot: Secondary | ICD-10-CM | POA: Diagnosis not present

## 2019-11-08 DIAGNOSIS — M86171 Other acute osteomyelitis, right ankle and foot: Secondary | ICD-10-CM | POA: Diagnosis not present

## 2019-11-08 DIAGNOSIS — D509 Iron deficiency anemia, unspecified: Secondary | ICD-10-CM | POA: Diagnosis not present

## 2019-11-08 DIAGNOSIS — Z992 Dependence on renal dialysis: Secondary | ICD-10-CM | POA: Diagnosis not present

## 2019-11-08 DIAGNOSIS — N2581 Secondary hyperparathyroidism of renal origin: Secondary | ICD-10-CM | POA: Diagnosis not present

## 2019-11-08 DIAGNOSIS — D631 Anemia in chronic kidney disease: Secondary | ICD-10-CM | POA: Diagnosis not present

## 2019-11-08 DIAGNOSIS — N186 End stage renal disease: Secondary | ICD-10-CM | POA: Diagnosis not present

## 2019-11-10 DIAGNOSIS — N2581 Secondary hyperparathyroidism of renal origin: Secondary | ICD-10-CM | POA: Diagnosis not present

## 2019-11-10 DIAGNOSIS — D631 Anemia in chronic kidney disease: Secondary | ICD-10-CM | POA: Diagnosis not present

## 2019-11-10 DIAGNOSIS — Z992 Dependence on renal dialysis: Secondary | ICD-10-CM | POA: Diagnosis not present

## 2019-11-10 DIAGNOSIS — D509 Iron deficiency anemia, unspecified: Secondary | ICD-10-CM | POA: Diagnosis not present

## 2019-11-10 DIAGNOSIS — N186 End stage renal disease: Secondary | ICD-10-CM | POA: Diagnosis not present

## 2019-11-10 DIAGNOSIS — M86171 Other acute osteomyelitis, right ankle and foot: Secondary | ICD-10-CM | POA: Diagnosis not present

## 2019-11-12 DIAGNOSIS — M86171 Other acute osteomyelitis, right ankle and foot: Secondary | ICD-10-CM | POA: Diagnosis not present

## 2019-11-12 DIAGNOSIS — Z992 Dependence on renal dialysis: Secondary | ICD-10-CM | POA: Diagnosis not present

## 2019-11-12 DIAGNOSIS — D631 Anemia in chronic kidney disease: Secondary | ICD-10-CM | POA: Diagnosis not present

## 2019-11-12 DIAGNOSIS — N186 End stage renal disease: Secondary | ICD-10-CM | POA: Diagnosis not present

## 2019-11-12 DIAGNOSIS — N2581 Secondary hyperparathyroidism of renal origin: Secondary | ICD-10-CM | POA: Diagnosis not present

## 2019-11-12 DIAGNOSIS — D509 Iron deficiency anemia, unspecified: Secondary | ICD-10-CM | POA: Diagnosis not present

## 2019-11-15 DIAGNOSIS — D509 Iron deficiency anemia, unspecified: Secondary | ICD-10-CM | POA: Diagnosis not present

## 2019-11-15 DIAGNOSIS — Z992 Dependence on renal dialysis: Secondary | ICD-10-CM | POA: Diagnosis not present

## 2019-11-15 DIAGNOSIS — N186 End stage renal disease: Secondary | ICD-10-CM | POA: Diagnosis not present

## 2019-11-15 DIAGNOSIS — M86171 Other acute osteomyelitis, right ankle and foot: Secondary | ICD-10-CM | POA: Diagnosis not present

## 2019-11-15 DIAGNOSIS — N2581 Secondary hyperparathyroidism of renal origin: Secondary | ICD-10-CM | POA: Diagnosis not present

## 2019-11-15 DIAGNOSIS — D631 Anemia in chronic kidney disease: Secondary | ICD-10-CM | POA: Diagnosis not present

## 2019-11-16 ENCOUNTER — Ambulatory Visit: Payer: Medicare Other | Admitting: Licensed Clinical Social Worker

## 2019-11-16 DIAGNOSIS — N2889 Other specified disorders of kidney and ureter: Secondary | ICD-10-CM

## 2019-11-16 DIAGNOSIS — I151 Hypertension secondary to other renal disorders: Secondary | ICD-10-CM

## 2019-11-16 DIAGNOSIS — N186 End stage renal disease: Secondary | ICD-10-CM

## 2019-11-16 DIAGNOSIS — J4530 Mild persistent asthma, uncomplicated: Secondary | ICD-10-CM

## 2019-11-16 NOTE — Chronic Care Management (AMB) (Signed)
Chronic Care Management    Clinical Social Work Follow Up Note  11/16/2019 Name: Christy Zhang [facility] MRN: [id number] DOB: 18-Mar-1956  Christy Zhang is a 64 y.o. year old female who is a primary care patient of [name], [name], MD. The CCM team was consulted for assistance with [name] .   Review of patient status, including review of consultants reports, other relevant assessments, and collaboration with appropriate care team members and the patient's provider was performed as part of comprehensive patient evaluation and provision of chronic care management services.    SDOH (Social Determinants of Health) assessments performed: No;risk for tobacco use; risk for depression; risk for stress    Chronic Care Management from 10/12/2019 in [place]  PHQ-9 Total Score 5      Outpatient Encounter Medications as of 11/16/2019  Medication Sig Note  . acetaminophen (TYLENOL) 325 MG tablet Take 2 tablets (650 mg total) by mouth every 6 (six) hours as needed for mild pain (or Fever >/= 101).   Marland Kitchen amLODipine (NORVASC) 5 MG tablet Take 1 tablet (5 mg total) by mouth at bedtime.   Marland Kitchen atorvastatin (LIPITOR) 40 MG tablet Take 1 tablet (40 mg total) by mouth daily. (Patient taking differently: Take 40 mg by mouth at bedtime. )   . cefTAZidime (FORTAZ) IVPB Inject 2 g into the vein every Monday, Wednesday, and Friday with hemodialysis. Indication:  osteomyelitis First Dose: Yes Last Day of Therapy:  11/26/19 Labs - Once weekly:  CBC/D and BMP, Labs - Every other week:  ESR and CRP Method of administration: IV Push, after vancomycin has run on HD days Method of administration may be changed at the discretion of home infusion pharmacist based upon assessment of the patient and/or caregiver's ability to self-administer the medication ordered. 11/16/2019: Dialysis  . diphenhydrAMINE (BENADRYL) 25 MG tablet Take 25 mg by mouth every 6 (six) hours as needed for  itching.   . Dulaglutide (TRULICITY) 1.5 SM/2.7MB SOPN Inject content of one pen under the skin weekly on Saturday (Patient taking differently: Inject 1.5 mg into the skin once a week. Saturday)   . insulin glargine, 1 Unit Dial, (TOUJEO SOLOSTAR) 300 UNIT/ML Solostar Pen Inject 100 Units into the skin daily. (Patient taking differently: Inject 100 Units into the skin in the morning. )   . metoprolol succinate (TOPROL-XL) 100 MG 24 hr tablet TAKE ONE TABLET BY MOUTH DAILY. TAKE WITH OR IMMEDIATELY FOLLOWING A MEAL. (Patient taking differently: Take 100 mg by mouth at bedtime. Marland Kitchen TAKE WITH OR IMMEDIATELY FOLLOWING A MEAL.)   . multivitamin (RENA-VIT) TABS tablet Take 1 tablet by mouth at bedtime.    . sevelamer carbonate (RENVELA) 800 MG tablet Take 1,600-2,400 mg by mouth See admin instructions. Take 2400 mg with each meal and 1600 mg with each snack   . vancomycin IVPB Inject 1,000 mg into the vein every Monday, Wednesday, and Friday with hemodialysis. Indication:  osteomyelitis First Dose: No Last Day of Therapy:  11/26/19 Labs - Sunday/Monday:  CBC/D, BMP, and vancomycin trough. Labs - Thursday:  BMP and vancomycin trough Labs - Every other week:  ESR and CRP Method of administration:Elastomeric Method of administration may be changed at the discretion of the patient and/or caregiver's ability to self-administer the medication ordered. 11/16/2019: Dialysis   No facility-administered encounter medications on file as of 11/16/2019.   LCSW called client phone number several times today but was not able to speak via phone with client LCSW did leave phone message  for Christy Zhang asking her to call LCSW at [phone]. LCSW also called the phone number for  Christy Zhang, daughter of client today. LCSW was not able to speak with [facility] via phone. LCSW did leave phone message for [facility] to please call LCSW at [phone] to discuss social work needs of client    Follow Up Plan: LCSW to call client or  daughter Christy Zhang, in next 4 weeks to talk  about client management of health issues and to talk with client or [facility] about client completion of daily activities  Norva Riffle.Tamari Busic MSW, LCSW Licensed Clinical Social Worker [facility] Care Management [phone]

## 2019-11-16 NOTE — Patient Instructions (Addendum)
Licensed Clinical Social Worker Visit Information  Materials Provided:   11/16/2019  Name: [facility]       MRN: [id number]       DOB: September 30, 1955  Ron Junco is a 64 y.o. year old female who is a primary care patient of [name], [name], MD. The CCM team was consulted for assistance with [name] .   Review of patient status, including review of consultants reports, other relevant assessments, and collaboration with appropriate care team members and the patient's provider was performed as part of comprehensive patient evaluation and provision of chronic care management services.    SDOH (Social Determinants of Health) assessments performed: No;risk for tobacco use; risk for depression; risk for stress  LCSW called client phone number several times today but was not able to speak via phone with client LCSW did leave phone message for Christy Zhang asking her to call LCSW at [phone]. LCSW also called the phone number for  Christy Zhang, daughter of client today. LCSW was not able to speak with [facility] via phone. LCSW did leave phone message for [facility] to please call LCSW at [phone] to discuss social work needs of client    Follow Up Plan: LCSW to call client or daughter Christy Zhang, in next 4 weeks to talk  about client management of health issues and to talk with client or [facility] about client completion of daily activities  LCSW was not able to speak with client or daughter, Christy Zhang , via phone today; thus, client or daughter, Christy Zhang, were not able to verbalize understanding of instructions provided today and were not able to accept or decline a print copy of patient instruction materials.   Norva Riffle.Kyler Germer MSW, LCSW Licensed Clinical Social Worker [facility] Care Management [phone]

## 2019-11-17 DIAGNOSIS — D631 Anemia in chronic kidney disease: Secondary | ICD-10-CM | POA: Diagnosis not present

## 2019-11-17 DIAGNOSIS — D509 Iron deficiency anemia, unspecified: Secondary | ICD-10-CM | POA: Diagnosis not present

## 2019-11-17 DIAGNOSIS — Z992 Dependence on renal dialysis: Secondary | ICD-10-CM | POA: Diagnosis not present

## 2019-11-17 DIAGNOSIS — M86171 Other acute osteomyelitis, right ankle and foot: Secondary | ICD-10-CM | POA: Diagnosis not present

## 2019-11-17 DIAGNOSIS — N186 End stage renal disease: Secondary | ICD-10-CM | POA: Diagnosis not present

## 2019-11-17 DIAGNOSIS — N2581 Secondary hyperparathyroidism of renal origin: Secondary | ICD-10-CM | POA: Diagnosis not present

## 2019-11-18 ENCOUNTER — Encounter (HOSPITAL_COMMUNITY): Payer: Medicare Other

## 2019-11-18 ENCOUNTER — Other Ambulatory Visit: Payer: Self-pay

## 2019-11-18 ENCOUNTER — Ambulatory Visit: Payer: Medicare Other

## 2019-11-18 ENCOUNTER — Encounter (HOSPITAL_COMMUNITY)
Admission: RE | Disposition: A | Discharge status: Home / Self Care | Payer: Self-pay | Source: Home / Self Care | Attending: Vascular Surgery

## 2019-11-18 ENCOUNTER — Ambulatory Visit (HOSPITAL_COMMUNITY)
Admission: RE | Admit: 2019-11-18 | Discharge: 2019-11-18 | Disposition: A | Discharge status: Home / Self Care | Payer: Medicare Other | Attending: Vascular Surgery | Admitting: Vascular Surgery

## 2019-11-18 ENCOUNTER — Inpatient Hospital Stay (HOSPITAL_COMMUNITY): Admission: RE | Admit: 2019-11-18 | Payer: Medicare Other | Source: Ambulatory Visit

## 2019-11-18 DIAGNOSIS — E11621 Type 2 diabetes mellitus with foot ulcer: Secondary | ICD-10-CM | POA: Diagnosis not present

## 2019-11-18 DIAGNOSIS — I132 Hypertensive heart and chronic kidney disease with heart failure and with stage 5 chronic kidney disease, or end stage renal disease: Secondary | ICD-10-CM | POA: Diagnosis not present

## 2019-11-18 DIAGNOSIS — E1122 Type 2 diabetes mellitus with diabetic chronic kidney disease: Secondary | ICD-10-CM | POA: Insufficient documentation

## 2019-11-18 DIAGNOSIS — I70245 Atherosclerosis of native arteries of left leg with ulceration of other part of foot: Secondary | ICD-10-CM | POA: Diagnosis not present

## 2019-11-18 DIAGNOSIS — L97519 Non-pressure chronic ulcer of other part of right foot with unspecified severity: Secondary | ICD-10-CM | POA: Diagnosis not present

## 2019-11-18 DIAGNOSIS — M869 Osteomyelitis, unspecified: Secondary | ICD-10-CM | POA: Diagnosis not present

## 2019-11-18 DIAGNOSIS — L97529 Non-pressure chronic ulcer of other part of left foot with unspecified severity: Secondary | ICD-10-CM | POA: Diagnosis not present

## 2019-11-18 DIAGNOSIS — E1151 Type 2 diabetes mellitus with diabetic peripheral angiopathy without gangrene: Secondary | ICD-10-CM | POA: Diagnosis not present

## 2019-11-18 DIAGNOSIS — Z882 Allergy status to sulfonamides status: Secondary | ICD-10-CM | POA: Insufficient documentation

## 2019-11-18 DIAGNOSIS — I509 Heart failure, unspecified: Secondary | ICD-10-CM | POA: Insufficient documentation

## 2019-11-18 DIAGNOSIS — E785 Hyperlipidemia, unspecified: Secondary | ICD-10-CM | POA: Insufficient documentation

## 2019-11-18 DIAGNOSIS — Z794 Long term (current) use of insulin: Secondary | ICD-10-CM | POA: Diagnosis not present

## 2019-11-18 DIAGNOSIS — N186 End stage renal disease: Secondary | ICD-10-CM | POA: Diagnosis not present

## 2019-11-18 DIAGNOSIS — I998 Other disorder of circulatory system: Secondary | ICD-10-CM | POA: Diagnosis not present

## 2019-11-18 DIAGNOSIS — G709 Myoneural disorder, unspecified: Secondary | ICD-10-CM | POA: Diagnosis not present

## 2019-11-18 DIAGNOSIS — Z79899 Other long term (current) drug therapy: Secondary | ICD-10-CM | POA: Diagnosis not present

## 2019-11-18 DIAGNOSIS — Z992 Dependence on renal dialysis: Secondary | ICD-10-CM | POA: Insufficient documentation

## 2019-11-18 DIAGNOSIS — Z87891 Personal history of nicotine dependence: Secondary | ICD-10-CM | POA: Diagnosis not present

## 2019-11-18 DIAGNOSIS — I70235 Atherosclerosis of native arteries of right leg with ulceration of other part of foot: Secondary | ICD-10-CM | POA: Insufficient documentation

## 2019-11-18 HISTORY — PX: ABDOMINAL AORTOGRAM W/LOWER EXTREMITY: CATH118223

## 2019-11-18 HISTORY — PX: PERIPHERAL VASCULAR BALLOON ANGIOPLASTY: CATH118281

## 2019-11-18 LAB — POCT I-STAT, CHEM 8
BUN: 36 mg/dL — ABNORMAL HIGH (ref 8–23)
Calcium, Ion: 1.02 mmol/L — ABNORMAL LOW (ref 1.15–1.40)
Chloride: 94 mmol/L — ABNORMAL LOW (ref 98–111)
Creatinine, Ser: 7.4 mg/dL — ABNORMAL HIGH (ref 0.44–1.00)
Glucose, Bld: 146 mg/dL — ABNORMAL HIGH (ref 70–99)
HCT: 38 % (ref 36.0–46.0)
Hemoglobin: 12.9 g/dL (ref 12.0–15.0)
Potassium: 5.5 mmol/L — ABNORMAL HIGH (ref 3.5–5.1)
Sodium: 135 mmol/L (ref 135–145)
TCO2: 32 mmol/L (ref 22–32)

## 2019-11-18 LAB — POCT ACTIVATED CLOTTING TIME
Activated Clotting Time: 230 seconds
Activated Clotting Time: 186 seconds

## 2019-11-18 SURGERY — ABDOMINAL AORTOGRAM W/LOWER EXTREMITY
Anesthesia: LOCAL | Laterality: Right

## 2019-11-18 MED ORDER — SODIUM CHLORIDE 0.9 % IV SOLN: 250.0000 mL | INTRAVENOUS | Status: DC | PRN

## 2019-11-18 MED ORDER — ACETAMINOPHEN 325 MG PO TABS
ORAL_TABLET | ORAL | Status: AC
  Filled 2019-11-18: qty 2

## 2019-11-18 MED ORDER — SODIUM CHLORIDE 0.9% FLUSH: 3.0000 mL | INTRAVENOUS | Status: DC | PRN

## 2019-11-18 MED ORDER — FAMOTIDINE IN NACL 20-0.9 MG/50ML-% IV SOLN
20.0000 mg | Freq: Once | INTRAVENOUS | Status: AC
  Administered 2019-11-18: 20 mg via INTRAVENOUS

## 2019-11-18 MED ORDER — HEPARIN (PORCINE) IN NACL 1000-0.9 UT/500ML-% IV SOLN
INTRAVENOUS | Status: DC | PRN
  Administered 2019-11-18 (×2): 500 mL

## 2019-11-18 MED ORDER — IODIXANOL 320 MG/ML IV SOLN
INTRAVENOUS | Status: DC | PRN
  Administered 2019-11-18: 150 mL via INTRA_ARTERIAL

## 2019-11-18 MED ORDER — SODIUM CHLORIDE 0.9% FLUSH: 3.0000 mL | Freq: Two times a day (BID) | INTRAVENOUS | Status: DC

## 2019-11-18 MED ORDER — ASPIRIN EC 81 MG PO TBEC
81.0000 mg | DELAYED_RELEASE_TABLET | Freq: Every day | ORAL | Status: DC
  Administered 2019-11-18: 81 mg via ORAL
  Filled 2019-11-18: qty 1

## 2019-11-18 MED ORDER — CLOPIDOGREL BISULFATE 75 MG PO TABS
75.0000 mg | ORAL_TABLET | Freq: Every day | ORAL | 11 refills | Status: AC
Start: 2019-11-18 — End: 2020-11-17

## 2019-11-18 MED ORDER — HEPARIN SODIUM (PORCINE) 1000 UNIT/ML IJ SOLN
INTRAMUSCULAR | Status: AC
  Filled 2019-11-18: qty 1

## 2019-11-18 MED ORDER — LIDOCAINE HCL (PF) 1 % IJ SOLN
INTRAMUSCULAR | Status: AC
  Filled 2019-11-18: qty 30

## 2019-11-18 MED ORDER — HYDRALAZINE HCL 20 MG/ML IJ SOLN: 5.0000 mg | INTRAMUSCULAR | Status: DC | PRN

## 2019-11-18 MED ORDER — HYDRALAZINE HCL 20 MG/ML IJ SOLN
INTRAMUSCULAR | Status: DC | PRN
  Administered 2019-11-18: 10 mg via INTRAVENOUS

## 2019-11-18 MED ORDER — FAMOTIDINE IN NACL 20-0.9 MG/50ML-% IV SOLN
INTRAVENOUS | Status: AC
  Filled 2019-11-18: qty 50

## 2019-11-18 MED ORDER — MIDAZOLAM HCL 2 MG/2ML IJ SOLN
INTRAMUSCULAR | Status: DC | PRN
  Administered 2019-11-18: 1 mg via INTRAVENOUS

## 2019-11-18 MED ORDER — HEPARIN (PORCINE) IN NACL 1000-0.9 UT/500ML-% IV SOLN
INTRAVENOUS | Status: AC
  Filled 2019-11-18: qty 1000

## 2019-11-18 MED ORDER — FENTANYL CITRATE (PF) 100 MCG/2ML IJ SOLN
INTRAMUSCULAR | Status: DC | PRN
  Administered 2019-11-18: 25 ug via INTRAVENOUS

## 2019-11-18 MED ORDER — ACETAMINOPHEN 325 MG PO TABS
650.0000 mg | ORAL_TABLET | ORAL | Status: DC | PRN
  Administered 2019-11-18: 650 mg via ORAL

## 2019-11-18 MED ORDER — MIDAZOLAM HCL 5 MG/5ML IJ SOLN
INTRAMUSCULAR | Status: AC
  Filled 2019-11-18: qty 5

## 2019-11-18 MED ORDER — CLOPIDOGREL BISULFATE 75 MG PO TABS: 75.0000 mg | ORAL_TABLET | Freq: Every day | ORAL | Status: DC

## 2019-11-18 MED ORDER — ONDANSETRON HCL 4 MG/2ML IJ SOLN: 4.0000 mg | Freq: Four times a day (QID) | INTRAMUSCULAR | Status: DC | PRN

## 2019-11-18 MED ORDER — FENTANYL CITRATE (PF) 100 MCG/2ML IJ SOLN
INTRAMUSCULAR | Status: AC
  Filled 2019-11-18: qty 2

## 2019-11-18 MED ORDER — CLOPIDOGREL BISULFATE 300 MG PO TABS
ORAL_TABLET | ORAL | Status: AC
  Filled 2019-11-18: qty 1

## 2019-11-18 MED ORDER — LIDOCAINE HCL (PF) 1 % IJ SOLN
INTRAMUSCULAR | Status: DC | PRN
  Administered 2019-11-18: 20 mL via INTRADERMAL

## 2019-11-18 MED ORDER — LABETALOL HCL 5 MG/ML IV SOLN: 10.0000 mg | INTRAVENOUS | Status: DC | PRN

## 2019-11-18 MED ORDER — HYDRALAZINE HCL 20 MG/ML IJ SOLN
INTRAMUSCULAR | Status: AC
  Filled 2019-11-18: qty 1

## 2019-11-18 MED ORDER — CLOPIDOGREL BISULFATE 75 MG PO TABS: 300.0000 mg | ORAL_TABLET | Freq: Once | ORAL | Status: AC

## 2019-11-18 MED ORDER — CLOPIDOGREL BISULFATE 300 MG PO TABS
ORAL_TABLET | ORAL | Status: DC | PRN
  Administered 2019-11-18: 300 mg via ORAL

## 2019-11-18 MED ORDER — HEPARIN SODIUM (PORCINE) 1000 UNIT/ML IJ SOLN
INTRAMUSCULAR | Status: DC | PRN
  Administered 2019-11-18: 11000 [IU] via INTRAVENOUS

## 2019-11-18 SURGICAL SUPPLY — 16 items
BALLN STERLING OTW 3X100X150 (BALLOONS) ×3
BALLOON STERLING OTW 3X100X150 (BALLOONS) ×2 IMPLANT
CATH CXI SUPP 2.6F 150 ST (CATHETERS) ×3 IMPLANT
CATH OMNI FLUSH 5F 65CM (CATHETERS) ×3 IMPLANT
KIT ENCORE 26 ADVANTAGE (KITS) ×3 IMPLANT
KIT MICROPUNCTURE NIT STIFF (SHEATH) ×3 IMPLANT
KIT PV (KITS) ×3 IMPLANT
SHEATH FLEX ANSEL ANG 5F 45CM (SHEATH) ×3 IMPLANT
SHEATH PINNACLE 5F 10CM (SHEATH) ×3 IMPLANT
SHEATH PINNACLE R/O II 5F 6CM (SHEATH) IMPLANT
SYR MEDRAD MARK 7 150ML (SYRINGE) ×3 IMPLANT
TRANSDUCER W/STOPCOCK (MISCELLANEOUS) ×3 IMPLANT
TRAY PV CATH (CUSTOM PROCEDURE TRAY) ×3 IMPLANT
TUBING INJECTOR 48 (MISCELLANEOUS) ×3 IMPLANT
WIRE BENTSON .035X145CM (WIRE) ×3 IMPLANT
WIRE G V18X300CM (WIRE) ×3 IMPLANT

## 2019-11-18 NOTE — Progress Notes (Signed)
Site area: Left groin a 5 french long arterial  Sheath was removed  Site Prior to Removal:  Level 0  Pressure Applied For 20 MINUTES    Bedrest Beginning at 1105am  Manual:   Yes.    Patient Status During Pull:  stable  Post Pull Groin Site:  Level 0  Post Pull Instructions Given: yes  Post Pull Pulses Present:  Yes.    Dressing Applied:  Yes.    Comments:

## 2019-11-18 NOTE — H&P (Signed)
History and Physical Interval Note:  11/18/2019 7:25 AM  Christy Zhang [facility]  has presented today for surgery, with the diagnosis of pad.  The various methods of treatment have been discussed with the patient and family. After consideration of risks, benefits and other options for treatment, the patient has consented to  Procedure(s): ABDOMINAL AORTOGRAM W/LOWER EXTREMITY (N/A) as a surgical intervention.  The patient's history has been reviewed, patient examined, no change in status, stable for surgery.  I have reviewed the patient's chart and labs.  Questions were answered to the patient's satisfaction.    Aortogram, bilateral lower extremity arteriogram.  CLI with tissue loss  Christy Zhang  History of Present Illness:  Patient is a 64 y.o. year old female who presents for evaluation of PAD with new diagnosis of right 3rd toe osteomyelitis. She has a prior history of right toe ulcer.  She was evaluated via telephone by Dr. Oneida Alar on May 6th, 2021 at request of Dr. Caryl Comes, her podiatrist. She had calcified vessels. She did have a right toe pressure of 95.   Arrangements were made for 3 month surveillance follow-up.   Today the patient presents with new right third toe ulcer.  She is accompanied by her daughter.  She ambulates via wheelchair.  No rest pain, fever or chills.  The patient states she was informed to call our office for follow-up after recent hospitalization (10/14/2019) to [facility] for right 3rd toe osteomyelitis:  Primary MDStacks, [name], MD  Recommendations for primary care physician for things to follow:  Give IV Fortaz and IV vancomycin after each hemodialysis sessions on Mondays Wednesdays and Fridays through 11/26/2019 for foot infection/bone infection/osteomyelitis Indication: osteomyelitis First Dose: Yes Last Day of Therapy: 11/26/19 Labs - Once weekly: CBC/D and BMP, Labs - Every other week: ESR and CRP Method of administration: IV Push, after  vancomycin has run on HD days  I asked her if she had any issues with her left foot and when she removes her sock, she has a small ischemic ulcer noted of the left third toe as well.  She denies drainage or bleeding from these ulcers.  She is on no anticoagulant medication and is a non-smoker.  Past medical history and medications are reviewed.  The patient is status post right upper arm AV loop graft by Dr. Trula Slade on March 18, 2019.  She denies any complications currently with dialysis.  No hand pain. She dialyzes on Mondays Wednesdays and Fridays at [facility] dialysis [facility].     Past Medical History:  Diagnosis Date  . Anemia   . Arthritis   . Asthma    in the past  . CHF (congestive heart failure) ([place])   . Chronic kidney disease    Renal failure, dialysis on M/W/F  . 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 End stage renal disease ([place])   . End stage renal disease on dialysis ([place])   . Hemodialysis access, arteriovenous graft ([place])   . Hyperlipidemia   . Hypertension   . Neuromuscular disorder [facility])          Past Surgical History:  Procedure Laterality Date  . A/V FISTULAGRAM Right 02/04/2017   Procedure: A/V Fistulagram;  Surgeon: Waynetta Sandy, MD;  Location: [place] [facility];  Service: Cardiovascular;  Laterality: Right;  . AV FISTULA PLACEMENT Right 08/25/2014   Procedure: RIGHT BRACHIOCEPHALIC ARTERIOVENOUS (AV) FISTULA CREATION;  Surgeon: Mal Misty, MD;  Location: [place];  Service:  Vascular;  Laterality: Right;  . AV FISTULA PLACEMENT Right 03/18/2019   Procedure: INSERTION OF ARTERIOVENOUS (AV) GORE-TEX GRAFT RIGHT ARM;  Surgeon: Serafina Mitchell, MD;  Location: [place];  Service: Vascular;  Laterality: Right;  . BASCILIC VEIN TRANSPOSITION Right 12/24/2018   Procedure: FIRST STAGE BASILIC VEIN TRANSPOSITION RIGHT UPPER ARM;  Surgeon: Serafina Mitchell, MD;  Location: [place];  Service: Vascular;  Laterality: Right;  .  FISTULA SUPERFICIALIZATION Right 11/03/2014   Procedure: RIGHT UPPER ARM FISTULA SUPERFICIALIZATION;  Surgeon: Mal Misty, MD;  Location: [place];  Service: Vascular;  Laterality: Right;  . FISTULOGRAM Right 01/16/2016   Procedure: RIGHT UPPER EXTREMITY ARTERIOVENOUS FISTULOGRAM WITH BALLOON ANGIOPLASTY;  Surgeon: Vickie Epley, MD;  Location: [facility];  Service: Vascular;  Laterality: Right;  . INSERTION OF DIALYSIS CATHETER     X4  . IR FLUORO GUIDE CV LINE RIGHT  12/03/2018  . IR REMOVAL TUN CV CATH W/O FL  07/06/2019  . IR THROMBECTOMY AV FISTULA W/THROMBOLYSIS/PTA INC/SHUNT/IMG RIGHT Right 12/03/2018  . IR THROMBECTOMY AV FISTULA W/THROMBOLYSIS/PTA/STENT INC/SHUNT/IMG RT Right 10/11/2019  . IR US GUIDE VASC ACCESS RIGHT  12/03/2018  . IR US GUIDE VASC ACCESS RIGHT  12/03/2018  . IR US GUIDE VASC ACCESS RIGHT  10/14/2019  . LIGATION OF COMPETING BRANCHES OF ARTERIOVENOUS FISTULA Right 11/03/2014   Procedure: LIGATION OF COMPETING BRANCHE x1 OF RIGHT UPPER ARM ARTERIOVENOUS FISTULA;  Surgeon: Mal Misty, MD;  Location: [place];  Service: Vascular;  Laterality: Right;  . PERIPHERAL VASCULAR BALLOON ANGIOPLASTY Right 02/04/2017   Procedure: PERIPHERAL VASCULAR BALLOON ANGIOPLASTY;  Surgeon: Waynetta Sandy, MD;  Location: [place] [facility];  Service: Cardiovascular;  Laterality: Right;  . TUBAL LIGATION      ROS:   General:  No weight loss, Fever, chills  HEENT: No recent headaches, no nasal bleeding, no visual changes, no sore throat  Neurologic: No dizziness, blackouts, seizures. No recent symptoms of stroke or mini- stroke. No recent episodes of slurred speech, or temporary blindness.  Cardiac: No recent episodes of chest pain/pressure, no shortness of breath at rest.  No shortness of breath with exertion.  Denies history of atrial fibrillation or irregular heartbeat  Vascular: No history of rest pain in feet.  No history of claudication.  No history of  non-healing ulcer, No history of DVT   Pulmonary: No home oxygen, no productive cough, no hemoptysis,  No asthma or wheezing  Musculoskeletal:  [ ]  Arthritis, [ ]  Low back pain,  [ ]  Joint pain  Hematologic:No history of hypercoagulable state.  No history of easy bleeding.  No history of anemia  Gastrointestinal: No hematochezia or melena,  No gastroesophageal reflux, no trouble swallowing  Urinary: [ ]  chronic Kidney disease, [ ]  on HD - [ ]  MWF or [ ]  TTHS, [ ]  Burning with urination, [ ]  Frequent urination, [ ]  Difficulty urinating;   Skin: No rashes  Psychological: No history of anxiety,  No history of depression  Social History Social History        Tobacco Use  . Smoking status: Former Smoker    Types: Cigarettes    Quit date: 07/25/1984    Years since quitting: 35.2  . Smokeless tobacco: Never Used  Vaping Use  . Vaping Use: Never used  Substance Use Topics  . Alcohol use: No    Alcohol/week: 0.0 standard drinks  . Drug use: No    Family History      Family History  Problem Relation Age of Onset  . Diabetes Mother   . Hypertension Mother   . Cancer Father   . Diabetes Father   . Hypertension Sister   . Diabetes Daughter   . Heart disease Daughter   . Hypertension Daughter   . Diabetes Son   . Heart disease Son   . Hypertension Son   . Peripheral vascular disease Son        amputation    Allergies      Allergies  Allergen Reactions  . Sulfa Antibiotics Swelling           Current Outpatient Medications  Medication Sig Dispense Refill  . acetaminophen (TYLENOL) 325 MG tablet Take 2 tablets (650 mg total) by mouth every 6 (six) hours as needed for mild pain (or Fever >/= 101). 12 tablet 0  . amLODipine (NORVASC) 5 MG tablet Take 1 tablet (5 mg total) by mouth at bedtime. 90 tablet 1  . atorvastatin (LIPITOR) 40 MG tablet Take 1 tablet (40 mg total) by mouth daily. 90 tablet 3  . cefTAZidime (FORTAZ) IVPB  Inject 2 g into the vein every Monday, Wednesday, and Friday with hemodialysis. Indication:  osteomyelitis First Dose: Yes Last Day of Therapy:  11/26/19 Labs - Once weekly:  CBC/D and BMP, Labs - Every other week:  ESR and CRP Method of administration: IV Push, after vancomycin has run on HD days Method of administration may be changed at the discretion of home infusion pharmacist based upon assessment of the patient and/or caregiver's ability to self-administer the medication ordered. 18 Units 0  . Dulaglutide (TRULICITY) 1.5 KC/0.0LK SOPN Inject content of one pen under the skin weekly on Saturday (Patient taking differently: Inject 1.5 mg into the skin once a week. ) 4 pen 12  . insulin glargine, 1 Unit Dial, (TOUJEO SOLOSTAR) 300 UNIT/ML Solostar Pen Inject 100 Units into the skin daily. (Patient taking differently: Inject 100 Units into the skin in the morning. ) 10 mL 2  . metoprolol succinate (TOPROL-XL) 100 MG 24 hr tablet TAKE ONE TABLET BY MOUTH DAILY. TAKE WITH OR IMMEDIATELY FOLLOWING A MEAL. (Patient taking differently: Take 100 mg by mouth at bedtime. Marland Kitchen TAKE WITH OR IMMEDIATELY FOLLOWING A MEAL.) 90 tablet 1  . multivitamin (RENA-VIT) TABS tablet Take 1 tablet by mouth at bedtime.     . sevelamer carbonate (RENVELA) 800 MG tablet Take 1,600-2,400 mg by mouth See admin instructions. Take 2400 mg with each meal and 1600 mg with each snack    . vancomycin IVPB Inject 1,000 mg into the vein every Monday, Wednesday, and Friday with hemodialysis. Indication:  osteomyelitis First Dose: No Last Day of Therapy:  11/26/19 Labs - Sunday/Monday:  CBC/D, BMP, and vancomycin trough. Labs - Thursday:  BMP and vancomycin trough Labs - Every other week:  ESR and CRP Method of administration:Elastomeric Method of administration may be changed at the discretion of the patient and/or caregiver's ability to self-administer the medication ordered. 18 Units 0   No current facility-administered  medications for this visit.    Physical Examination     Vitals:   10/28/19 0900  BP: (!) 157/72  Pulse: 73  Resp: 20  Temp: (!) 97.5 F (36.4 C)  SpO2: 96%   General:  Alert and oriented, no acute distress HEENT: Normal Pulmonary: Clear to auscultation bilaterally Cardiac: Regular Rate and Rhythm without murmur Skin: No rash Extremity Pulses: She has a palpable right anterior tibial pulse.  She has a peroneal Doppler  signal but I am unable to obtain a posterior tibial signal. Ischemic ulcers noted of the distal ends of the right and left 3rd toes. Musculoskeletal: No deformity or edema      Neurologic: Upper and lower extremity motor 5/5 and symmetric        DATA:  CLINICAL DATA: Ulcer to the third toe  EXAM: RIGHT THIRD TOE  COMPARISON: None.  FINDINGS: Bones appear demineralized. There are vascular calcifications. No fracture. Second and third toe distal soft tissue ulcers. Bony destructive change involving the second and third distal phalanges suspicious for osteomyelitis.  IMPRESSION: 1. Bony destructive change involving the second and third distal phalanges suspicious for osteomyelitis. Second and third toe distal soft tissue ulcers.   Electronically Signed By: Donavan Foil M.D. 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 Ulcer of left 3rd toe also noted.  PLAN:  She will need arteriogram of the RLE and will need non-invasive studies to include the LLE.  Continue intravenous antibiotics as previously prescribed.  Betadine paint to right and left third toes daily.  The treatment plan is discussed with the patient and her daughter and their questions are answered.  The patient is in agreement with this plan.  Clinic MD: Weston Anna, PA-C [facility] Office:  [phone] Pager: [phone]

## 2019-11-18 NOTE — Op Note (Signed)
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  Ultrasound-guided access of left common femoral artery 2.  Aortogram including catheter selection of aorta 3.  Bilateral lower extremity arteriogram including catheter selection of third order branches in the right lower extremity 4.  Right anterior tibial artery angioplasty (3 mm x 100 mm Sterling) 5.  41 minutes of monitored moderate conscious sedation time  Indications: Patient is a 64 year old female who has been followed by Dr. Oneida Alar for tissue loss in the right lower extremity and this has been managed conservatively.  She recently presented to clinic with right third toe osteomyelitis and also left third toe tissue loss now.  She presents for planned arteriogram possible intervention after risk benefits discussed.  Findings:   Aortogram showed widely patent aortoiliac segment with no flow-limiting stenosis bilaterally.  On the right she had patent common femoral, profunda, SFA, above and below-knee popliteal artery with no flow limiting stenosis.  She has severe tibial disease with single-vessel runoff via the anterior tibial that had multifocal high-grade stenosis in the midsegment of the artery and then ultimately occludes just above the ankle with significant small vessel disease in the right foot and essentially no runoff into the foot.  On the left she has a patent common femoral, profunda, SFA, above and below-knee popliteal artery.  Again she has severe tibial disease in the left lower extremity and appear to have a patent trifurcation but the posterior tibial occluded shortly after takeoff.  She has a very diseased AT and peroneal artery heavily calcified with  multiple segments of high-grade stenosis.  Ultimately the right anterior tibial artery was selected from the left transfemoral access and the anterior tibial artery was angioplastied with a 3 mm Sterling.  No evidence of dissection with no residual stenosis and excellent flow down to the ankle where the AT occludes as demonstrated on imaging prior to intervention.   Procedure:  The patient was identified in the holding area and taken to room 8.  The patient was then placed supine on the table and prepped and draped in the usual sterile fashion.  A time out was called.  Ultrasound was used to evaluate the left common femoral artery.  It was patent .  A digital ultrasound image was acquired.  A micropuncture needle was used to access the left common femoral artery under ultrasound guidance.  An 018 wire was advanced without resistance and a micropuncture sheath was placed.  The 018 wire was removed and a benson wire was placed.  The micropuncture sheath was exchanged for a 5 french sheath.  An omniflush catheter was advanced over the wire to the level of L-1.  An abdominal angiogram was obtained.  Next the catheter was pulled down and bilateral lower extremity runoff was obtained with pertinent findings noted above.  After evaluating imaging elected to intervene on the right anterior tibial artery.  Used a Astronomer with a Omni Flush catheter and crossed the bifurcation into the right iliac and right SFA.  We then exchanged for a long 5 [place] Ansell sheath in the left groin over the aortic bifurcation into the right SFA.  Patient was given 100 units per kilogram heparin.  Then used a V 18 with a CXI catheter and ultimately got down the anterior  tibial and crossed the high-grade stenosis all the way down to the distal vessel reconfirmed with hand-injection that we were in the true lumen.  Then over the V 18 we angioplastied the anterior tibial stenosis with a 3 mm x 100 mm Sterling to nominal pressure for 2  minutes.  Final injection showed no residual stenosis in the AT with a widely patent vessel except for where it occludes at the ankle as demonstrated on imaging prior to intervention.  That point time wires and catheters were removed.  The sheath was pulled over the bifurcation and should be taken holding have the sheath removed with manual pressure.  Plan: Patient will be loaded on aspirin Plavix.  I will arrange intervention on the left leg next week due to tissue loss in the left leg as well.  I think she still high risk for limb loss given she has such poor outflow in the right foot even after intervention today.   [name], MD [facility] [place] Office: [phone]

## 2019-11-18 NOTE — Discharge Instructions (Signed)
Angiogram, Care After This sheet gives you information about how to care for yourself after your procedure. Your health care provider may also give you more specific instructions. If you have problems or questions, contact your health care provider. What can I expect after the procedure? After the procedure, it is common to have bruising and tenderness at the catheter insertion area. Follow these instructions at home: Insertion site care  Follow instructions from your health care provider about how to take care of your insertion site. Make sure you: ? Wash your hands with soap and water before you change your bandage (dressing). If soap and water are not available, use hand sanitizer. ? Change your dressing as told by your health care provider. ? Leave stitches (sutures), skin glue, or adhesive strips in place. These skin closures may need to stay in place for 2 weeks or longer. If adhesive strip edges start to loosen and curl up, you may trim the loose edges. Do not remove adhesive strips completely unless your health care provider tells you to do that.  Do not take baths, swim, or use a hot tub until your health care provider approves.  You may shower 24-48 hours after the procedure or as told by your health care provider. ? Gently wash the site with plain soap and water. ? Pat the area dry with a clean towel. ? Do not rub the site. This may cause bleeding.  Do not apply powder or lotion to the site. Keep the site clean and dry.  Check your insertion site every day for signs of infection. Check for: ? Redness, swelling, or pain. ? Fluid or blood. ? Warmth. ? Pus or a bad smell. Activity  Rest as told by your health care provider, usually for 1-2 days.  Do not lift anything that is heavier than 10 lbs. (4.5 kg) or as told by your health care provider.  Do not drive for 24 hours if you were given a medicine to help you relax (sedative).  Do not drive or use heavy machinery while  taking prescription pain medicine. General instructions   Return to your normal activities as told by your health care provider, usually in about a week. Ask your health care provider what activities are safe for you.  If the catheter site starts bleeding, lie flat and put pressure on the site. If the bleeding does not stop, get help right away. This is a medical emergency.  Drink enough fluid to keep your urine clear or pale yellow. This helps flush the contrast dye from your body.  Take over-the-counter and prescription medicines only as told by your health care provider.  Keep all follow-up visits as told by your health care provider. This is important. Contact a health care provider if:  You have a fever or chills.  You have redness, swelling, or pain around your insertion site.  You have fluid or blood coming from your insertion site.  The insertion site feels warm to the touch.  You have pus or a bad smell coming from your insertion site.  You have bruising around the insertion site.  You notice blood collecting in the tissue around the catheter site (hematoma). The hematoma may be painful to the touch. Get help right away if:  You have severe pain at the catheter insertion area.  The catheter insertion area swells very fast.  The catheter insertion area is bleeding, and the bleeding does not stop when you hold steady pressure on the area.    The area near or just beyond the catheter insertion site becomes pale, cool, tingly, or numb. These symptoms may represent a serious problem that is an emergency. Do not wait to see if the symptoms will go away. Get medical help right away. Call your local emergency services (911 in the [place] Do not drive yourself to the hospital. Summary  After the procedure, it is common to have bruising and tenderness at the catheter insertion area.  After the procedure, it is important to rest and drink plenty of fluids.  Do not take baths,  swim, or use a hot tub until your health care provider says it is okay to do so. You may shower 24-48 hours after the procedure or as told by your health care provider.  If the catheter site starts bleeding, lie flat and put pressure on the site. If the bleeding does not stop, get help right away. This is a medical emergency. This information is not intended to replace advice given to you by your health care provider. Make sure you discuss any questions you have with your health care provider. Document Revised: 03/28/2017 Document Reviewed: 03/20/2016 Elsevier Patient Education  2020 Elsevier Inc.  

## 2019-11-19 ENCOUNTER — Encounter (HOSPITAL_COMMUNITY): Payer: Self-pay | Admitting: Vascular Surgery

## 2019-11-19 DIAGNOSIS — M86171 Other acute osteomyelitis, right ankle and foot: Secondary | ICD-10-CM | POA: Diagnosis not present

## 2019-11-19 DIAGNOSIS — N2581 Secondary hyperparathyroidism of renal origin: Secondary | ICD-10-CM | POA: Diagnosis not present

## 2019-11-19 DIAGNOSIS — N186 End stage renal disease: Secondary | ICD-10-CM | POA: Diagnosis not present

## 2019-11-19 DIAGNOSIS — D509 Iron deficiency anemia, unspecified: Secondary | ICD-10-CM | POA: Diagnosis not present

## 2019-11-19 DIAGNOSIS — Z992 Dependence on renal dialysis: Secondary | ICD-10-CM | POA: Diagnosis not present

## 2019-11-19 DIAGNOSIS — D631 Anemia in chronic kidney disease: Secondary | ICD-10-CM | POA: Diagnosis not present

## 2019-11-22 DIAGNOSIS — Z992 Dependence on renal dialysis: Secondary | ICD-10-CM | POA: Diagnosis not present

## 2019-11-22 DIAGNOSIS — D509 Iron deficiency anemia, unspecified: Secondary | ICD-10-CM | POA: Diagnosis not present

## 2019-11-22 DIAGNOSIS — M86171 Other acute osteomyelitis, right ankle and foot: Secondary | ICD-10-CM | POA: Diagnosis not present

## 2019-11-22 DIAGNOSIS — N186 End stage renal disease: Secondary | ICD-10-CM | POA: Diagnosis not present

## 2019-11-22 DIAGNOSIS — D631 Anemia in chronic kidney disease: Secondary | ICD-10-CM | POA: Diagnosis not present

## 2019-11-22 DIAGNOSIS — N2581 Secondary hyperparathyroidism of renal origin: Secondary | ICD-10-CM | POA: Diagnosis not present

## 2019-11-24 ENCOUNTER — Other Ambulatory Visit: Payer: Self-pay

## 2019-11-24 DIAGNOSIS — N2581 Secondary hyperparathyroidism of renal origin: Secondary | ICD-10-CM | POA: Diagnosis not present

## 2019-11-24 DIAGNOSIS — D509 Iron deficiency anemia, unspecified: Secondary | ICD-10-CM | POA: Diagnosis not present

## 2019-11-24 DIAGNOSIS — M86171 Other acute osteomyelitis, right ankle and foot: Secondary | ICD-10-CM | POA: Diagnosis not present

## 2019-11-24 DIAGNOSIS — Z992 Dependence on renal dialysis: Secondary | ICD-10-CM | POA: Diagnosis not present

## 2019-11-24 DIAGNOSIS — N186 End stage renal disease: Secondary | ICD-10-CM | POA: Diagnosis not present

## 2019-11-24 DIAGNOSIS — D631 Anemia in chronic kidney disease: Secondary | ICD-10-CM | POA: Diagnosis not present

## 2019-11-24 MED ORDER — SODIUM CHLORIDE 0.9 % IV SOLN: 250.0000 mL | INTRAVENOUS | Status: DC | PRN

## 2019-11-24 MED ORDER — SODIUM CHLORIDE 0.9% FLUSH: 3.0000 mL | Freq: Two times a day (BID) | INTRAVENOUS | Status: DC

## 2019-11-24 MED ORDER — SODIUM CHLORIDE 0.9% FLUSH: 3.0000 mL | INTRAVENOUS | Status: DC | PRN

## 2019-11-26 DIAGNOSIS — M86171 Other acute osteomyelitis, right ankle and foot: Secondary | ICD-10-CM | POA: Diagnosis not present

## 2019-11-26 DIAGNOSIS — N2581 Secondary hyperparathyroidism of renal origin: Secondary | ICD-10-CM | POA: Diagnosis not present

## 2019-11-26 DIAGNOSIS — D509 Iron deficiency anemia, unspecified: Secondary | ICD-10-CM | POA: Diagnosis not present

## 2019-11-26 DIAGNOSIS — Z992 Dependence on renal dialysis: Secondary | ICD-10-CM | POA: Diagnosis not present

## 2019-11-26 DIAGNOSIS — D631 Anemia in chronic kidney disease: Secondary | ICD-10-CM | POA: Diagnosis not present

## 2019-11-26 DIAGNOSIS — N186 End stage renal disease: Secondary | ICD-10-CM | POA: Diagnosis not present

## 2019-11-27 DIAGNOSIS — Z992 Dependence on renal dialysis: Secondary | ICD-10-CM | POA: Diagnosis not present

## 2019-11-27 DIAGNOSIS — N186 End stage renal disease: Secondary | ICD-10-CM | POA: Diagnosis not present

## 2019-11-29 DIAGNOSIS — D631 Anemia in chronic kidney disease: Secondary | ICD-10-CM | POA: Diagnosis not present

## 2019-11-29 DIAGNOSIS — Z992 Dependence on renal dialysis: Secondary | ICD-10-CM | POA: Diagnosis not present

## 2019-11-29 DIAGNOSIS — N186 End stage renal disease: Secondary | ICD-10-CM | POA: Diagnosis not present

## 2019-11-29 DIAGNOSIS — N2581 Secondary hyperparathyroidism of renal origin: Secondary | ICD-10-CM | POA: Diagnosis not present

## 2019-11-29 DIAGNOSIS — D509 Iron deficiency anemia, unspecified: Secondary | ICD-10-CM | POA: Diagnosis not present

## 2019-12-01 DIAGNOSIS — Z992 Dependence on renal dialysis: Secondary | ICD-10-CM | POA: Diagnosis not present

## 2019-12-01 DIAGNOSIS — D631 Anemia in chronic kidney disease: Secondary | ICD-10-CM | POA: Diagnosis not present

## 2019-12-01 DIAGNOSIS — D509 Iron deficiency anemia, unspecified: Secondary | ICD-10-CM | POA: Diagnosis not present

## 2019-12-01 DIAGNOSIS — N2581 Secondary hyperparathyroidism of renal origin: Secondary | ICD-10-CM | POA: Diagnosis not present

## 2019-12-01 DIAGNOSIS — N186 End stage renal disease: Secondary | ICD-10-CM | POA: Diagnosis not present

## 2019-12-03 DIAGNOSIS — Z992 Dependence on renal dialysis: Secondary | ICD-10-CM | POA: Diagnosis not present

## 2019-12-03 DIAGNOSIS — D509 Iron deficiency anemia, unspecified: Secondary | ICD-10-CM | POA: Diagnosis not present

## 2019-12-03 DIAGNOSIS — N2581 Secondary hyperparathyroidism of renal origin: Secondary | ICD-10-CM | POA: Diagnosis not present

## 2019-12-03 DIAGNOSIS — N186 End stage renal disease: Secondary | ICD-10-CM | POA: Diagnosis not present

## 2019-12-03 DIAGNOSIS — D631 Anemia in chronic kidney disease: Secondary | ICD-10-CM | POA: Diagnosis not present

## 2019-12-06 DIAGNOSIS — N186 End stage renal disease: Secondary | ICD-10-CM | POA: Diagnosis not present

## 2019-12-06 DIAGNOSIS — D631 Anemia in chronic kidney disease: Secondary | ICD-10-CM | POA: Diagnosis not present

## 2019-12-06 DIAGNOSIS — Z992 Dependence on renal dialysis: Secondary | ICD-10-CM | POA: Diagnosis not present

## 2019-12-06 DIAGNOSIS — D509 Iron deficiency anemia, unspecified: Secondary | ICD-10-CM | POA: Diagnosis not present

## 2019-12-06 DIAGNOSIS — N2581 Secondary hyperparathyroidism of renal origin: Secondary | ICD-10-CM | POA: Diagnosis not present

## 2019-12-08 DIAGNOSIS — N2581 Secondary hyperparathyroidism of renal origin: Secondary | ICD-10-CM | POA: Diagnosis not present

## 2019-12-08 DIAGNOSIS — D509 Iron deficiency anemia, unspecified: Secondary | ICD-10-CM | POA: Diagnosis not present

## 2019-12-08 DIAGNOSIS — D631 Anemia in chronic kidney disease: Secondary | ICD-10-CM | POA: Diagnosis not present

## 2019-12-08 DIAGNOSIS — N186 End stage renal disease: Secondary | ICD-10-CM | POA: Diagnosis not present

## 2019-12-08 DIAGNOSIS — Z992 Dependence on renal dialysis: Secondary | ICD-10-CM | POA: Diagnosis not present

## 2019-12-09 ENCOUNTER — Encounter (HOSPITAL_COMMUNITY): Payer: Medicare Other

## 2019-12-09 ENCOUNTER — Ambulatory Visit: Payer: Medicare Other

## 2019-12-10 DIAGNOSIS — N186 End stage renal disease: Secondary | ICD-10-CM | POA: Diagnosis not present

## 2019-12-10 DIAGNOSIS — D631 Anemia in chronic kidney disease: Secondary | ICD-10-CM | POA: Diagnosis not present

## 2019-12-10 DIAGNOSIS — D509 Iron deficiency anemia, unspecified: Secondary | ICD-10-CM | POA: Diagnosis not present

## 2019-12-10 DIAGNOSIS — N2581 Secondary hyperparathyroidism of renal origin: Secondary | ICD-10-CM | POA: Diagnosis not present

## 2019-12-10 DIAGNOSIS — Z992 Dependence on renal dialysis: Secondary | ICD-10-CM | POA: Diagnosis not present

## 2019-12-13 DIAGNOSIS — D631 Anemia in chronic kidney disease: Secondary | ICD-10-CM | POA: Diagnosis not present

## 2019-12-13 DIAGNOSIS — N186 End stage renal disease: Secondary | ICD-10-CM | POA: Diagnosis not present

## 2019-12-13 DIAGNOSIS — D509 Iron deficiency anemia, unspecified: Secondary | ICD-10-CM | POA: Diagnosis not present

## 2019-12-13 DIAGNOSIS — N2581 Secondary hyperparathyroidism of renal origin: Secondary | ICD-10-CM | POA: Diagnosis not present

## 2019-12-13 DIAGNOSIS — Z992 Dependence on renal dialysis: Secondary | ICD-10-CM | POA: Diagnosis not present

## 2019-12-15 DIAGNOSIS — Z992 Dependence on renal dialysis: Secondary | ICD-10-CM | POA: Diagnosis not present

## 2019-12-15 DIAGNOSIS — D631 Anemia in chronic kidney disease: Secondary | ICD-10-CM | POA: Diagnosis not present

## 2019-12-15 DIAGNOSIS — N186 End stage renal disease: Secondary | ICD-10-CM | POA: Diagnosis not present

## 2019-12-15 DIAGNOSIS — N2581 Secondary hyperparathyroidism of renal origin: Secondary | ICD-10-CM | POA: Diagnosis not present

## 2019-12-15 DIAGNOSIS — D509 Iron deficiency anemia, unspecified: Secondary | ICD-10-CM | POA: Diagnosis not present

## 2019-12-16 ENCOUNTER — Encounter (HOSPITAL_COMMUNITY): Admission: RE | Payer: Self-pay | Source: Home / Self Care

## 2019-12-16 ENCOUNTER — Ambulatory Visit (HOSPITAL_COMMUNITY): Admission: RE | Admit: 2019-12-16 | Payer: Medicare Other | Source: Home / Self Care | Admitting: Vascular Surgery

## 2019-12-16 SURGERY — ABDOMINAL AORTOGRAM W/LOWER EXTREMITY
Anesthesia: LOCAL

## 2019-12-17 ENCOUNTER — Telehealth: Payer: Self-pay | Admitting: *Deleted

## 2019-12-17 DIAGNOSIS — D631 Anemia in chronic kidney disease: Secondary | ICD-10-CM | POA: Diagnosis not present

## 2019-12-17 DIAGNOSIS — D509 Iron deficiency anemia, unspecified: Secondary | ICD-10-CM | POA: Diagnosis not present

## 2019-12-17 DIAGNOSIS — N2581 Secondary hyperparathyroidism of renal origin: Secondary | ICD-10-CM | POA: Diagnosis not present

## 2019-12-17 DIAGNOSIS — Z992 Dependence on renal dialysis: Secondary | ICD-10-CM | POA: Diagnosis not present

## 2019-12-17 DIAGNOSIS — N186 End stage renal disease: Secondary | ICD-10-CM | POA: Diagnosis not present

## 2019-12-17 NOTE — Telephone Encounter (Signed)
Left VM for pt to reschedule procedure

## 2019-12-20 ENCOUNTER — Ambulatory Visit (INDEPENDENT_AMBULATORY_CARE_PROVIDER_SITE_OTHER): Payer: Medicare Other | Admitting: Licensed Clinical Social Worker

## 2019-12-20 DIAGNOSIS — D631 Anemia in chronic kidney disease: Secondary | ICD-10-CM | POA: Diagnosis not present

## 2019-12-20 DIAGNOSIS — J4530 Mild persistent asthma, uncomplicated: Secondary | ICD-10-CM

## 2019-12-20 DIAGNOSIS — Z992 Dependence on renal dialysis: Secondary | ICD-10-CM

## 2019-12-20 DIAGNOSIS — N2889 Other specified disorders of kidney and ureter: Secondary | ICD-10-CM | POA: Diagnosis not present

## 2019-12-20 DIAGNOSIS — I151 Hypertension secondary to other renal disorders: Secondary | ICD-10-CM | POA: Diagnosis not present

## 2019-12-20 DIAGNOSIS — N186 End stage renal disease: Secondary | ICD-10-CM

## 2019-12-20 DIAGNOSIS — D509 Iron deficiency anemia, unspecified: Secondary | ICD-10-CM | POA: Diagnosis not present

## 2019-12-20 DIAGNOSIS — N2581 Secondary hyperparathyroidism of renal origin: Secondary | ICD-10-CM | POA: Diagnosis not present

## 2019-12-20 NOTE — Patient Instructions (Addendum)
Licensed Clinical Social Worker Visit Information  Goals we discussed today:    .  Client will talk with LCSW in next 30 days about managing health issues of client and completion daily activities (pt-stated)         CARE PLAN ENTRY   Current Barriers:   Diabetes management in client with chronic diagnoses of Asthma, HTN, ESRD, Anemia ,Obesity  Mobility issues (uses a walker to walk)  Clinical Social Work Clinical Goal(s):   LCSW will call client in next 30 days to discuss client management of health issues and client completion of daily activities  Interventions:  Talked with [name], daughter of client, about CCM program services  Talked with [name] about RNCM support with CCM program  Talked with [facility] about dialysis treatment for client (goes to dialysis 3 days weekly, goes to dialysis Monday, Wednesday and Friday)  Talked with [facility] about skin care issues of client  Talked with [facility]  about client completion of ADLs  Talked with [facility]  about ambulation needs of client  Talked with [facility] about upcoming client appointments  Talked with [facility] about challenges for client  in standing (uses a walker to ambulate)  Talked with [facility]  about pain issues of client  Talked with [facility] about transport issues of client  Talked with [facility] about death of client's spouse over one year ago  Talked with [facility] about rash of client (tried [place] ointment, tried moisturizing cream)  Encouraged Chief Executive Officer or client to call RNCM as needed to discuss nursing needs of client  Patient Self Care Activities:   Attends scheduled medical appointments Eats meals independently Attends Dialysis treatments weekly as scheduled  Patient Self Care Deficits:  Diabetes management     Mobility issues  Initial goal documentation     Follow Up Plan: LCSW to call [name] next 4 weeks to talk about clientmanagement of health issues and to talk withclient  or Christy about clientcompletion of daily activities  Materials Provided: No  The patient/Christy Zhang, daughter of patient, verbalized understanding of instructions provided today and declined a print copy of patient instruction materials.   Norva Riffle.[name] MSW, LCSW Licensed Clinical Social Worker [facility] [phone]

## 2019-12-20 NOTE — Chronic Care Management (AMB) (Signed)
Chronic Care Management    Clinical Social Work Follow Up Note  12/20/2019 Name: Christy Zhang [facility] MRN: [id number] DOB: 28-Feb-1956  Christy Zhang is a 64 y.o. year old female who is a primary care patient of [name], [name], MD. The CCM team was consulted for assistance with [name] .   Review of patient status, including review of consultants reports, other relevant assessments, and collaboration with appropriate care team members and the patient's provider was performed as part of comprehensive patient evaluation and provision of chronic care management services.    SDOH (Social Determinants of Health) assessments performed: No;risk for tobacco use; risk for depression; risk for stress; risk for physical inactivity    Chronic Care Management from 10/12/2019 in [place]  PHQ-9 Total Score 5       Outpatient Encounter Medications as of 12/20/2019  Medication Sig  . acetaminophen (TYLENOL) 325 MG tablet Take 2 tablets (650 mg total) by mouth every 6 (six) hours as needed for mild pain (or Fever >/= 101).  Marland Kitchen amLODipine (NORVASC) 5 MG tablet Take 1 tablet (5 mg total) by mouth at bedtime.  Marland Kitchen atorvastatin (LIPITOR) 40 MG tablet Take 1 tablet (40 mg total) by mouth daily. (Patient taking differently: Take 40 mg by mouth at bedtime. )  . clopidogrel (PLAVIX) 75 MG tablet Take 1 tablet (75 mg total) by mouth daily.  . diphenhydrAMINE (BENADRYL) 25 MG tablet Take 25 mg by mouth every 6 (six) hours as needed for itching.  . Dulaglutide (TRULICITY) 1.5 IH/4.7QQ SOPN Inject content of one pen under the skin weekly on Saturday (Patient taking differently: Inject 1.5 mg into the skin once a week. Saturday)  . insulin glargine, 1 Unit Dial, (TOUJEO SOLOSTAR) 300 UNIT/ML Solostar Pen Inject 100 Units into the skin daily. (Patient taking differently: Inject 100 Units into the skin in the morning. )  . metoprolol succinate (TOPROL-XL) 100 MG 24 hr tablet  TAKE ONE TABLET BY MOUTH DAILY. TAKE WITH OR IMMEDIATELY FOLLOWING A MEAL. (Patient taking differently: Take 100 mg by mouth at bedtime. Marland Kitchen TAKE WITH OR IMMEDIATELY FOLLOWING A MEAL.)  . multivitamin (RENA-VIT) TABS tablet Take 1 tablet by mouth at bedtime.   . sevelamer carbonate (RENVELA) 800 MG tablet Take 1,600-2,400 mg by mouth See admin instructions. Take 2400 mg with each meal and 1600 mg with each snack   Facility-Administered Encounter Medications as of 12/20/2019  Medication  . 0.9 %  sodium chloride infusion  . sodium chloride flush (NS) 0.9 % injection 3 mL  . sodium chloride flush (NS) 0.9 % injection 3 mL     Goals    .  Client Zhang talk with LCSW in next 30 days about managing health issues of client and completion daily activities (pt-stated)      CARE PLAN ENTRY   Current Barriers:  . Diabetes management in client with chronic diagnoses of Asthma, HTN, ESRD, Anemia ,Obesity . Mobility issues (uses a walker to walk)  Clinical Social Work Clinical Goal(s):  Marland Kitchen LCSW Zhang call client in next 30 days to discuss client management of health issues and client completion of daily activities  Interventions: . Talked with [name], daughter of client, about CCM program services . Talked with [facility] about RNCM support with CCM program . Talked with [facility] about dialysis treatment for client (goes to dialysis 3 days weekly, goes to dialysis Monday, Wednesday and Friday) . Talked with [facility] about skin care issues of client . Talked with  [name]  about client completion of ADLs . Talked with [facility]  about ambulation needs of client . Talked with [facility] about upcoming client appointments . Talked with [facility] about challenges for client  in standing (uses a walker to ambulate) . Talked with [facility]  about pain issues of client . Talked with [facility] about transport issues of client . Talked with [name] about death of client's spouse over one year ago . Talked with [facility] about rash of  client (tried [place] ointment, tried moisturizing cream) . Encouraged [facility] or client to call RNCM as needed to discuss nursing needs of client  Patient Self Care Activities:   Attends scheduled medical appointments Eats meals independently Attends Dialysis treatments weekly as scheduled  Patient Self Care Deficits: . Diabetes management     Mobility issues  Initial goal documentation     Follow Up Plan: LCSW to call client or daughter [name], in next 4 weeks to talk  about client management of health issues and to talk with client or [facility] about client completion of daily activities  Norva Riffle.Merryn Thaker MSW, LCSW Licensed Clinical Social Worker [facility] Care Management [phone]

## 2019-12-22 DIAGNOSIS — D631 Anemia in chronic kidney disease: Secondary | ICD-10-CM | POA: Diagnosis not present

## 2019-12-22 DIAGNOSIS — Z79899 Other long term (current) drug therapy: Secondary | ICD-10-CM | POA: Diagnosis not present

## 2019-12-22 DIAGNOSIS — N186 End stage renal disease: Secondary | ICD-10-CM | POA: Diagnosis not present

## 2019-12-22 DIAGNOSIS — D509 Iron deficiency anemia, unspecified: Secondary | ICD-10-CM | POA: Diagnosis not present

## 2019-12-22 DIAGNOSIS — N2581 Secondary hyperparathyroidism of renal origin: Secondary | ICD-10-CM | POA: Diagnosis not present

## 2019-12-22 DIAGNOSIS — Z992 Dependence on renal dialysis: Secondary | ICD-10-CM | POA: Diagnosis not present

## 2019-12-24 ENCOUNTER — Telehealth: Payer: Self-pay

## 2019-12-24 DIAGNOSIS — N2581 Secondary hyperparathyroidism of renal origin: Secondary | ICD-10-CM | POA: Diagnosis not present

## 2019-12-24 DIAGNOSIS — N186 End stage renal disease: Secondary | ICD-10-CM | POA: Diagnosis not present

## 2019-12-24 DIAGNOSIS — D631 Anemia in chronic kidney disease: Secondary | ICD-10-CM | POA: Diagnosis not present

## 2019-12-24 DIAGNOSIS — D509 Iron deficiency anemia, unspecified: Secondary | ICD-10-CM | POA: Diagnosis not present

## 2019-12-24 DIAGNOSIS — Z992 Dependence on renal dialysis: Secondary | ICD-10-CM | POA: Diagnosis not present

## 2019-12-24 NOTE — Telephone Encounter (Signed)
Attempted to reach pt on 8/25 and today to reschedule her for angiogram with Dr. Carlis Abbott. Female stated pt was at dialysis. Spoke with Joy at [facility] who informed pt of office attempts to reach. Pt advised that she has other health issues going on at the moment and will call when ready to schedule.

## 2019-12-27 DIAGNOSIS — D509 Iron deficiency anemia, unspecified: Secondary | ICD-10-CM | POA: Diagnosis not present

## 2019-12-27 DIAGNOSIS — Z992 Dependence on renal dialysis: Secondary | ICD-10-CM | POA: Diagnosis not present

## 2019-12-27 DIAGNOSIS — N2581 Secondary hyperparathyroidism of renal origin: Secondary | ICD-10-CM | POA: Diagnosis not present

## 2019-12-27 DIAGNOSIS — N186 End stage renal disease: Secondary | ICD-10-CM | POA: Diagnosis not present

## 2019-12-27 DIAGNOSIS — D631 Anemia in chronic kidney disease: Secondary | ICD-10-CM | POA: Diagnosis not present

## 2019-12-28 DIAGNOSIS — N186 End stage renal disease: Secondary | ICD-10-CM | POA: Diagnosis not present

## 2019-12-28 DIAGNOSIS — Z992 Dependence on renal dialysis: Secondary | ICD-10-CM | POA: Diagnosis not present

## 2019-12-29 ENCOUNTER — Other Ambulatory Visit: Payer: Self-pay

## 2019-12-29 ENCOUNTER — Inpatient Hospital Stay (HOSPITAL_COMMUNITY)
Admission: EM | Admit: 2019-12-29 | Discharge: 2019-12-31 | DRG: 606 | Disposition: A | Discharge status: Home / Self Care | Payer: Medicare Other | Attending: Internal Medicine | Admitting: Internal Medicine

## 2019-12-29 ENCOUNTER — Emergency Department (HOSPITAL_COMMUNITY): Payer: Medicare Other

## 2019-12-29 ENCOUNTER — Encounter (HOSPITAL_COMMUNITY): Payer: Self-pay | Admitting: Emergency Medicine

## 2019-12-29 DIAGNOSIS — Z87891 Personal history of nicotine dependence: Secondary | ICD-10-CM | POA: Diagnosis not present

## 2019-12-29 DIAGNOSIS — Z882 Allergy status to sulfonamides status: Secondary | ICD-10-CM | POA: Diagnosis not present

## 2019-12-29 DIAGNOSIS — D631 Anemia in chronic kidney disease: Secondary | ICD-10-CM | POA: Diagnosis present

## 2019-12-29 DIAGNOSIS — I5032 Chronic diastolic (congestive) heart failure: Secondary | ICD-10-CM | POA: Diagnosis present

## 2019-12-29 DIAGNOSIS — E1152 Type 2 diabetes mellitus with diabetic peripheral angiopathy with gangrene: Secondary | ICD-10-CM | POA: Diagnosis present

## 2019-12-29 DIAGNOSIS — L03311 Cellulitis of abdominal wall: Secondary | ICD-10-CM

## 2019-12-29 DIAGNOSIS — E1122 Type 2 diabetes mellitus with diabetic chronic kidney disease: Secondary | ICD-10-CM | POA: Diagnosis present

## 2019-12-29 DIAGNOSIS — N250 Renal osteodystrophy: Secondary | ICD-10-CM | POA: Diagnosis not present

## 2019-12-29 DIAGNOSIS — N186 End stage renal disease: Secondary | ICD-10-CM

## 2019-12-29 DIAGNOSIS — E039 Hypothyroidism, unspecified: Secondary | ICD-10-CM | POA: Diagnosis present

## 2019-12-29 DIAGNOSIS — N2581 Secondary hyperparathyroidism of renal origin: Secondary | ICD-10-CM | POA: Diagnosis present

## 2019-12-29 DIAGNOSIS — N2889 Other specified disorders of kidney and ureter: Secondary | ICD-10-CM | POA: Diagnosis present

## 2019-12-29 DIAGNOSIS — M1990 Unspecified osteoarthritis, unspecified site: Secondary | ICD-10-CM | POA: Diagnosis present

## 2019-12-29 DIAGNOSIS — R21 Rash and other nonspecific skin eruption: Secondary | ICD-10-CM | POA: Diagnosis not present

## 2019-12-29 DIAGNOSIS — Z7902 Long term (current) use of antithrombotics/antiplatelets: Secondary | ICD-10-CM | POA: Diagnosis not present

## 2019-12-29 DIAGNOSIS — Z794 Long term (current) use of insulin: Secondary | ICD-10-CM

## 2019-12-29 DIAGNOSIS — I509 Heart failure, unspecified: Secondary | ICD-10-CM | POA: Diagnosis not present

## 2019-12-29 DIAGNOSIS — E8889 Other specified metabolic disorders: Secondary | ICD-10-CM | POA: Diagnosis present

## 2019-12-29 DIAGNOSIS — Z20822 Contact with and (suspected) exposure to covid-19: Secondary | ICD-10-CM | POA: Diagnosis present

## 2019-12-29 DIAGNOSIS — J45909 Unspecified asthma, uncomplicated: Secondary | ICD-10-CM | POA: Diagnosis present

## 2019-12-29 DIAGNOSIS — E785 Hyperlipidemia, unspecified: Secondary | ICD-10-CM | POA: Diagnosis present

## 2019-12-29 DIAGNOSIS — I132 Hypertensive heart and chronic kidney disease with heart failure and with stage 5 chronic kidney disease, or end stage renal disease: Secondary | ICD-10-CM | POA: Diagnosis present

## 2019-12-29 DIAGNOSIS — Z833 Family history of diabetes mellitus: Secondary | ICD-10-CM | POA: Diagnosis not present

## 2019-12-29 DIAGNOSIS — Z79899 Other long term (current) drug therapy: Secondary | ICD-10-CM

## 2019-12-29 DIAGNOSIS — Z8249 Family history of ischemic heart disease and other diseases of the circulatory system: Secondary | ICD-10-CM

## 2019-12-29 DIAGNOSIS — R1031 Right lower quadrant pain: Secondary | ICD-10-CM | POA: Diagnosis not present

## 2019-12-29 DIAGNOSIS — Z6841 Body Mass Index (BMI) 40.0 and over, adult: Secondary | ICD-10-CM

## 2019-12-29 DIAGNOSIS — M793 Panniculitis, unspecified: Secondary | ICD-10-CM | POA: Diagnosis present

## 2019-12-29 DIAGNOSIS — Z992 Dependence on renal dialysis: Secondary | ICD-10-CM | POA: Diagnosis not present

## 2019-12-29 DIAGNOSIS — E1129 Type 2 diabetes mellitus with other diabetic kidney complication: Secondary | ICD-10-CM | POA: Diagnosis not present

## 2019-12-29 DIAGNOSIS — E66813 Obesity, class 3: Secondary | ICD-10-CM

## 2019-12-29 DIAGNOSIS — I700 Atherosclerosis of aorta: Secondary | ICD-10-CM | POA: Diagnosis not present

## 2019-12-29 LAB — COMPREHENSIVE METABOLIC PANEL
ALT: 8 U/L (ref 0–44)
AST: 7 U/L — ABNORMAL LOW (ref 15–41)
Albumin: 2.9 g/dL — ABNORMAL LOW (ref 3.5–5.0)
Alkaline Phosphatase: 51 U/L (ref 38–126)
Anion gap: 18 — ABNORMAL HIGH (ref 5–15)
BUN: 33 mg/dL — ABNORMAL HIGH (ref 8–23)
CO2: 24 mmol/L (ref 22–32)
Calcium: 9.4 mg/dL (ref 8.9–10.3)
Chloride: 96 mmol/L — ABNORMAL LOW (ref 98–111)
Creatinine, Ser: 9.53 mg/dL — ABNORMAL HIGH (ref 0.44–1.00)
GFR calc Af Amer: 5 mL/min — ABNORMAL LOW (ref >60)
GFR calc non Af Amer: 4 mL/min — ABNORMAL LOW (ref >60)
Glucose, Bld: 61 mg/dL — ABNORMAL LOW (ref 70–99)
Potassium: 3.9 mmol/L (ref 3.5–5.1)
Sodium: 138 mmol/L (ref 135–145)
Total Bilirubin: 0.4 mg/dL (ref 0.3–1.2)
Total Protein: 9 g/dL — ABNORMAL HIGH (ref 6.5–8.1)

## 2019-12-29 LAB — CBG MONITORING, ED
Glucose-Capillary: 43 mg/dL — CL (ref 70–99)
Glucose-Capillary: 48 mg/dL — ABNORMAL LOW (ref 70–99)
Glucose-Capillary: 160 mg/dL — ABNORMAL HIGH (ref 70–99)
Glucose-Capillary: 63 mg/dL — ABNORMAL LOW (ref 70–99)
Glucose-Capillary: 77 mg/dL (ref 70–99)

## 2019-12-29 LAB — CBC WITH DIFFERENTIAL/PLATELET
Abs Immature Granulocytes: 0.21 10*3/uL — ABNORMAL HIGH (ref 0.00–0.07)
Basophils Absolute: 0.1 10*3/uL (ref 0.0–0.1)
Basophils Relative: 0 %
Eosinophils Absolute: 0.6 10*3/uL — ABNORMAL HIGH (ref 0.0–0.5)
Eosinophils Relative: 3 %
HCT: 27.5 % — ABNORMAL LOW (ref 36.0–46.0)
Hemoglobin: 8.2 g/dL — ABNORMAL LOW (ref 12.0–15.0)
Immature Granulocytes: 1 %
Lymphocytes Relative: 10 %
Lymphs Abs: 2 10*3/uL (ref 0.7–4.0)
MCH: 26.8 pg (ref 26.0–34.0)
MCHC: 29.8 g/dL — ABNORMAL LOW (ref 30.0–36.0)
MCV: 89.9 fL (ref 80.0–100.0)
Monocytes Absolute: 1.1 10*3/uL — ABNORMAL HIGH (ref 0.1–1.0)
Monocytes Relative: 5 %
Neutro Abs: 16.5 10*3/uL — ABNORMAL HIGH (ref 1.7–7.7)
Neutrophils Relative %: 81 %
Platelets: 825 10*3/uL — ABNORMAL HIGH (ref 150–400)
RBC: 3.06 MIL/uL — ABNORMAL LOW (ref 3.87–5.11)
RDW: 15.4 % (ref 11.5–15.5)
WBC: 20.5 10*3/uL — ABNORMAL HIGH (ref 4.0–10.5)
nRBC: 0 % (ref 0.0–0.2)

## 2019-12-29 LAB — HEMOGLOBIN A1C
Hgb A1c MFr Bld: 6.4 % — ABNORMAL HIGH (ref 4.8–5.6)
Mean Plasma Glucose: 136.98 mg/dL

## 2019-12-29 LAB — SARS CORONAVIRUS 2 BY RT PCR (HOSPITAL ORDER, PERFORMED IN ~~LOC~~ HOSPITAL LAB): SARS Coronavirus 2: NEGATIVE

## 2019-12-29 LAB — MAGNESIUM: Magnesium: 1.8 mg/dL (ref 1.7–2.4)

## 2019-12-29 LAB — LACTIC ACID, PLASMA: Lactic Acid, Venous: 1 mmol/L (ref 0.5–1.9)

## 2019-12-29 MED ORDER — SEVELAMER CARBONATE 800 MG PO TABS
1600.0000 mg | ORAL_TABLET | Freq: Three times a day (TID) | ORAL | Status: DC | PRN
  Filled 2019-12-29: qty 2

## 2019-12-29 MED ORDER — LIDOCAINE HCL (PF) 1 % IJ SOLN: 5.0000 mL | INTRAMUSCULAR | Status: DC | PRN

## 2019-12-29 MED ORDER — DEXTROSE 50 % IV SOLN
1.0000 | Freq: Once | INTRAVENOUS | Status: AC
  Administered 2019-12-29: 50 mL via INTRAVENOUS
  Filled 2019-12-29: qty 50

## 2019-12-29 MED ORDER — ONDANSETRON HCL 4 MG/2ML IJ SOLN: 4.0000 mg | Freq: Four times a day (QID) | INTRAMUSCULAR | Status: DC | PRN

## 2019-12-29 MED ORDER — CLOPIDOGREL BISULFATE 75 MG PO TABS
75.0000 mg | ORAL_TABLET | Freq: Every day | ORAL | Status: DC
  Administered 2019-12-29 – 2019-12-31 (×3): 75 mg via ORAL
  Filled 2019-12-29 (×3): qty 1

## 2019-12-29 MED ORDER — SODIUM CHLORIDE 0.9 % IV SOLN: 250.0000 mL | INTRAVENOUS | Status: DC | PRN

## 2019-12-29 MED ORDER — CHLORHEXIDINE GLUCONATE CLOTH 2 % EX PADS: 6.0000 | MEDICATED_PAD | Freq: Every day | CUTANEOUS | Status: DC

## 2019-12-29 MED ORDER — DARBEPOETIN ALFA 100 MCG/0.5ML IJ SOSY
100.0000 ug | PREFILLED_SYRINGE | INTRAMUSCULAR | Status: DC
  Administered 2019-12-29: 100 ug via INTRAVENOUS
  Filled 2019-12-29: qty 0.5

## 2019-12-29 MED ORDER — ONDANSETRON HCL 4 MG PO TABS: 4.0000 mg | ORAL_TABLET | Freq: Four times a day (QID) | ORAL | Status: DC | PRN

## 2019-12-29 MED ORDER — VANCOMYCIN HCL IN DEXTROSE 1-5 GM/200ML-% IV SOLN: 1000.0000 mg | INTRAVENOUS | Status: DC

## 2019-12-29 MED ORDER — SODIUM CHLORIDE 0.9 % IV SOLN: 100.0000 mL | INTRAVENOUS | Status: DC | PRN

## 2019-12-29 MED ORDER — LIDOCAINE-PRILOCAINE 2.5-2.5 % EX CREA: 1.0000 "application " | TOPICAL_CREAM | CUTANEOUS | Status: DC | PRN

## 2019-12-29 MED ORDER — SODIUM CHLORIDE 0.9% FLUSH: 3.0000 mL | INTRAVENOUS | Status: DC | PRN

## 2019-12-29 MED ORDER — SODIUM CHLORIDE 0.9 % IV SOLN: 2.0000 g | INTRAVENOUS | Status: DC

## 2019-12-29 MED ORDER — METOPROLOL SUCCINATE ER 50 MG PO TB24
100.0000 mg | ORAL_TABLET | Freq: Every day | ORAL | Status: DC
  Administered 2019-12-29 – 2019-12-30 (×2): 100 mg via ORAL
  Filled 2019-12-29: qty 2
  Filled 2019-12-29: qty 4

## 2019-12-29 MED ORDER — SODIUM CHLORIDE 0.9 % IV SOLN
2.0000 g | Freq: Once | INTRAVENOUS | Status: AC
  Administered 2019-12-29: 2 g via INTRAVENOUS
  Filled 2019-12-29: qty 20

## 2019-12-29 MED ORDER — PENTAFLUOROPROP-TETRAFLUOROETH EX AERO: 1.0000 "application " | INHALATION_SPRAY | CUTANEOUS | Status: DC | PRN

## 2019-12-29 MED ORDER — SODIUM CHLORIDE 0.9% FLUSH
3.0000 mL | Freq: Two times a day (BID) | INTRAVENOUS | Status: DC
  Administered 2019-12-30 – 2019-12-31 (×3): 3 mL via INTRAVENOUS

## 2019-12-29 MED ORDER — VANCOMYCIN HCL 2000 MG/400ML IV SOLN: 2000.0000 mg | Freq: Once | INTRAVENOUS | Status: DC

## 2019-12-29 MED ORDER — INSULIN ASPART 100 UNIT/ML ~~LOC~~ SOLN: 0.0000 [IU] | Freq: Three times a day (TID) | SUBCUTANEOUS | Status: DC

## 2019-12-29 MED ORDER — SEVELAMER CARBONATE 800 MG PO TABS
2400.0000 mg | ORAL_TABLET | Freq: Three times a day (TID) | ORAL | Status: DC
  Administered 2019-12-30 – 2019-12-31 (×6): 2400 mg via ORAL
  Filled 2019-12-29 (×11): qty 3

## 2019-12-29 MED ORDER — ACETAMINOPHEN 650 MG RE SUPP: 650.0000 mg | Freq: Four times a day (QID) | RECTAL | Status: DC | PRN

## 2019-12-29 MED ORDER — ACETAMINOPHEN 325 MG PO TABS
650.0000 mg | ORAL_TABLET | Freq: Four times a day (QID) | ORAL | Status: DC | PRN
  Administered 2019-12-30 – 2019-12-31 (×2): 650 mg via ORAL
  Filled 2019-12-29 (×2): qty 2

## 2019-12-29 MED ORDER — INSULIN ASPART 100 UNIT/ML ~~LOC~~ SOLN: 0.0000 [IU] | Freq: Every day | SUBCUTANEOUS | Status: DC

## 2019-12-29 MED ORDER — ATORVASTATIN CALCIUM 40 MG PO TABS
40.0000 mg | ORAL_TABLET | Freq: Every day | ORAL | Status: DC
  Administered 2019-12-29 – 2019-12-30 (×2): 40 mg via ORAL
  Filled 2019-12-29 (×2): qty 1

## 2019-12-29 MED ORDER — HEPARIN SODIUM (PORCINE) 5000 UNIT/ML IJ SOLN
5000.0000 [IU] | Freq: Three times a day (TID) | INTRAMUSCULAR | Status: DC
  Administered 2019-12-30 – 2019-12-31 (×4): 5000 [IU] via SUBCUTANEOUS
  Filled 2019-12-29 (×4): qty 1

## 2019-12-29 MED ORDER — VANCOMYCIN HCL IN DEXTROSE 1-5 GM/200ML-% IV SOLN: 1000.0000 mg | Freq: Once | INTRAVENOUS | Status: DC

## 2019-12-29 MED ORDER — HEPARIN SODIUM (PORCINE) 1000 UNIT/ML DIALYSIS
20.0000 [IU]/kg | INTRAMUSCULAR | Status: DC | PRN
  Administered 2019-12-29: 2200 [IU] via INTRAVENOUS_CENTRAL
  Filled 2019-12-29: qty 3

## 2019-12-29 MED ORDER — SODIUM CHLORIDE 0.9 % IV SOLN
2.0000 g | INTRAVENOUS | Status: DC
  Administered 2019-12-30 – 2019-12-31 (×2): 2 g via INTRAVENOUS
  Filled 2019-12-29 (×2): qty 2

## 2019-12-29 MED ORDER — SODIUM CHLORIDE 0.9 % IV BOLUS (SEPSIS)
250.0000 mL | Freq: Once | INTRAVENOUS | Status: AC
  Administered 2019-12-29: 250 mL via INTRAVENOUS

## 2019-12-29 MED ORDER — VANCOMYCIN HCL IN DEXTROSE 1-5 GM/200ML-% IV SOLN
1000.0000 mg | INTRAVENOUS | Status: DC
  Administered 2019-12-30 – 2019-12-31 (×2): 1000 mg via INTRAVENOUS
  Filled 2019-12-29 (×2): qty 200

## 2019-12-29 MED ORDER — RENA-VITE PO TABS
1.0000 | ORAL_TABLET | Freq: Every day | ORAL | Status: DC
  Administered 2019-12-30: 1 via ORAL
  Filled 2019-12-29 (×3): qty 1

## 2019-12-29 MED ORDER — VANCOMYCIN HCL IN DEXTROSE 1-5 GM/200ML-% IV SOLN
1000.0000 mg | Freq: Once | INTRAVENOUS | Status: DC
  Administered 2019-12-29: 1000 mg via INTRAVENOUS
  Filled 2019-12-29: qty 200

## 2019-12-29 NOTE — ED Notes (Signed)
Heather RN attempted IV but was unsuccessful.

## 2019-12-29 NOTE — ED Provider Notes (Signed)
Emergency Department Provider Note   I have reviewed the triage vital signs and the nursing notes.   HISTORY  Chief Complaint Rash   HPI Christy Zhang is a 64 y.o. female with past medical history reviewed below presents to the emergency department with rash in the right inguinal crease.  Patient states that the rash began approximately 2 weeks ago shortly after she was shaved for vascular procedure.  She states it progressively worsened over the past 2 weeks and has become foul-smelling.  She has not experienced fevers or chills.  No nausea, vomiting, diarrhea.  She is placed a paper towel in this area to catch the now significant amount of drainage coming from the wound.  No radiation of symptoms or other modifying factors.  Past Medical History:  Diagnosis Date  . Anemia   . Arthritis   . Asthma    in the past  . CHF (congestive heart failure) ([place])   . Chronic kidney disease    Renal failure, dialysis on M/W/F  . 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 End stage renal disease ([place])   . End stage renal disease on dialysis ([place])   . Hemodialysis access, arteriovenous graft ([place])   . Hyperlipidemia   . Hypertension   . Neuromuscular disorder [facility])     Patient Active Problem List   Diagnosis Date Noted  . Panniculitis 12/29/2019  . Osteomyelitis of second toe of right foot ([place]) 10/15/2019  . Hyperglycemia due to diabetes mellitus ([place]) 10/15/2019  . Osteomyelitis of third toe of right foot ([place]) 10/14/2019  . Controlled type 2 diabetes mellitus with chronic kidney disease on chronic dialysis, with Kannon Granderson-term current use of insulin ([place]) 12/02/2016  . End stage renal disease on dialysis ([place])   . Asthma, mild persistent 07/25/2015  . Hypertension secondary to other renal disorders 07/25/2015  . Diabetes ([place]) 03/30/2015  . Anemia in chronic kidney disease 02/17/2015  . 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  Essential (primary) hypertension 06/24/2014  . Heart failure ([place]) 06/24/2014  . Obesity 06/24/2014    Past Surgical History:  Procedure Laterality Date  . A/V FISTULAGRAM Right 02/04/2017   Procedure: A/V Fistulagram;  Surgeon: Waynetta Sandy, MD;  Location: [place] [facility];  Service: Cardiovascular;  Laterality: Right;  . ABDOMINAL AORTOGRAM W/LOWER EXTREMITY N/A 11/18/2019   Procedure: ABDOMINAL AORTOGRAM W/LOWER EXTREMITY;  Surgeon: [name], MD;  Location: [place] [facility];  Service: Cardiovascular;  Laterality: N/A;  . AV FISTULA PLACEMENT Right 08/25/2014   Procedure: RIGHT BRACHIOCEPHALIC ARTERIOVENOUS (AV) FISTULA CREATION;  Surgeon: Mal Misty, MD;  Location: [facility] OR;  Service: Vascular;  Laterality: Right;  . AV FISTULA PLACEMENT Right 03/18/2019   Procedure: INSERTION OF ARTERIOVENOUS (AV) GORE-TEX GRAFT RIGHT ARM;  Surgeon: Serafina Mitchell, MD;  Location: [place];  Service: Vascular;  Laterality: Right;  . BASCILIC VEIN TRANSPOSITION Right 12/24/2018   Procedure: FIRST STAGE BASILIC VEIN TRANSPOSITION RIGHT UPPER ARM;  Surgeon: Serafina Mitchell, MD;  Location: [place];  Service: Vascular;  Laterality: Right;  . FISTULA SUPERFICIALIZATION Right 11/03/2014   Procedure: RIGHT UPPER ARM FISTULA SUPERFICIALIZATION;  Surgeon: Mal Misty, MD;  Location: [place];  Service: Vascular;  Laterality: Right;  . FISTULOGRAM Right 01/16/2016   Procedure: RIGHT UPPER EXTREMITY ARTERIOVENOUS FISTULOGRAM WITH BALLOON ANGIOPLASTY;  Surgeon: Vickie Epley, MD;  Location: [facility];  Service: Vascular;  Laterality: Right;  . INSERTION  OF DIALYSIS CATHETER     X4  . IR FLUORO GUIDE CV LINE RIGHT  12/03/2018  . IR REMOVAL TUN CV CATH W/O FL  07/06/2019  . IR THROMBECTOMY AV FISTULA W/THROMBOLYSIS/PTA INC/SHUNT/IMG RIGHT Right 12/03/2018  . IR THROMBECTOMY AV FISTULA W/THROMBOLYSIS/PTA/STENT INC/SHUNT/IMG RT Right 10/11/2019  . IR US GUIDE VASC ACCESS RIGHT  12/03/2018  . IR US GUIDE  VASC ACCESS RIGHT  12/03/2018  . IR US GUIDE VASC ACCESS RIGHT  10/14/2019  . LIGATION OF COMPETING BRANCHES OF ARTERIOVENOUS FISTULA Right 11/03/2014   Procedure: LIGATION OF COMPETING BRANCHE x1 OF RIGHT UPPER ARM ARTERIOVENOUS FISTULA;  Surgeon: Mal Misty, MD;  Location: [place];  Service: Vascular;  Laterality: Right;  . PERIPHERAL VASCULAR BALLOON ANGIOPLASTY Right 02/04/2017   Procedure: PERIPHERAL VASCULAR BALLOON ANGIOPLASTY;  Surgeon: Waynetta Sandy, MD;  Location: [place] [facility];  Service: Cardiovascular;  Laterality: Right;  . PERIPHERAL VASCULAR BALLOON ANGIOPLASTY Right 11/18/2019   Procedure: PERIPHERAL VASCULAR BALLOON ANGIOPLASTY;  Surgeon: [name], MD;  Location: [place] [facility];  Service: Cardiovascular;  Laterality: Right;  anterior tibial  . TUBAL LIGATION      Allergies Sulfa antibiotics  Family History  Problem Relation Age of Onset  . Diabetes Mother   . Hypertension Mother   . Cancer Father   . Diabetes Father   . Hypertension Sister   . Diabetes Daughter   . Heart disease Daughter   . Hypertension Daughter   . Diabetes Son   . Heart disease Son   . Hypertension Son   . Peripheral vascular disease Son        amputation    Social History Social History   Tobacco Use  . Smoking status: Former Smoker    Types: Cigarettes    Quit date: 07/25/1984    Years since quitting: 35.4  . Smokeless tobacco: Never Used  Vaping Use  . Vaping Use: Never used  Substance Use Topics  . Alcohol use: No    Alcohol/week: 0.0 standard drinks  . Drug use: No    Review of Systems  Constitutional: No fever/chills Eyes: No visual changes. ENT: No sore throat. Cardiovascular: Denies chest pain. Respiratory: Denies shortness of breath. Gastrointestinal: No abdominal pain.  No nausea, no vomiting.  No diarrhea.  No constipation. Genitourinary: Negative for dysuria. Musculoskeletal: Negative for back pain. Skin: Rash in the right inguinal  area.  Neurological: Negative for headaches, focal weakness or numbness.  10-point ROS otherwise negative.  ____________________________________________   PHYSICAL EXAM:  VITAL SIGNS: ED Triage Vitals  Enc Vitals Group     BP 12/29/19 0903 (!) 126/105     Pulse Rate 12/29/19 0903 84     Resp 12/29/19 0903 18     Temp 12/29/19 0903 98.6 F (37 C)     Temp Source 12/29/19 0903 Oral     SpO2 12/29/19 0903 100 %     Weight 12/29/19 0904 238 lb (108 kg)     Height 12/29/19 0904 5\' 4"  (1.626 m)    Constitutional: Alert and oriented. Well appearing and in no acute distress. Eyes: Conjunctivae are normal.  Head: Atraumatic. Nose: No congestion/rhinnorhea. Mouth/Throat: Mucous membranes are moist.   Neck: No stridor.   Cardiovascular: Normal rate, regular rhythm. Good peripheral circulation. Grossly normal heart sounds.   Respiratory: Normal respiratory effort.  No retractions. Lungs CTAB. Gastrointestinal: Soft and nontender. No distention.  Musculoskeletal: No lower extremity tenderness nor edema. No gross deformities of extremities. Neurologic:  Normal speech and language. No gross focal neurologic deficits are appreciated.  Skin:  Skin is warm and dry.  Right inguinal crease area of skin breakdown pictured below.  Approximately 15 x 5 cm area which is foul-smelling with overlying biofilm.  There is some extension of the rash over the mons pubis.  No clear abscess.  No active bleeding.  Some darker, necrotic looking tissue at the base more medially.       ____________________________________________   LABS (all labs ordered are listed, but only abnormal results are displayed)  Labs Reviewed  COMPREHENSIVE METABOLIC PANEL - Abnormal; Notable for the following components:      Result Value   Chloride 96 (*)    Glucose, Bld 61 (*)    BUN 33 (*)    Creatinine, Ser 9.53 (*)    Total Protein 9.0 (*)    Albumin 2.9 (*)    AST 7 (*)    GFR calc non Af Amer 4 (*)    GFR calc  Af Amer 5 (*)    Anion gap 18 (*)    All other components within normal limits  CBC WITH DIFFERENTIAL/PLATELET - Abnormal; Notable for the following components:   WBC 20.5 (*)    RBC 3.06 (*)    Hemoglobin 8.2 (*)    HCT 27.5 (*)    MCHC 29.8 (*)    Platelets 825 (*)    Neutro Abs 16.5 (*)    Monocytes Absolute 1.1 (*)    Eosinophils Absolute 0.6 (*)    Abs Immature Granulocytes 0.21 (*)    All other components within normal limits  CULTURE, BLOOD (ROUTINE X 2)  SARS CORONAVIRUS 2 BY RT PCR (HOSPITAL ORDER, [place] [facility])  CULTURE, BLOOD (ROUTINE X 2)  LACTIC ACID, PLASMA  LACTIC ACID, PLASMA  HEMOGLOBIN A1C   ____________________________________________  RADIOLOGY  CT ABDOMEN PELVIS WO CONTRAST  Result Date: 12/29/2019 CLINICAL DATA:  Acute right lower quadrant abdominal pain. EXAM: CT ABDOMEN AND PELVIS WITHOUT CONTRAST TECHNIQUE: Multidetector CT imaging of the abdomen and pelvis was performed following the standard protocol without IV contrast. COMPARISON:  None. FINDINGS: Lower chest: No acute abnormality. Hepatobiliary: No focal liver abnormality is seen. No gallstones, gallbladder wall thickening, or biliary dilatation. Pancreas: Unremarkable. No pancreatic ductal dilatation or surrounding inflammatory changes. Spleen: Normal in size without focal abnormality. Adrenals/Urinary Tract: Adrenal glands are unremarkable. 2.3 cm partially exophytic rounded abnormality is seen arising posteriorly from the lower pole of left kidney. It appears to be solid in density and is concerning for possible neoplasm or malignancy. Right kidney is unremarkable. No hydronephrosis or renal obstruction is noted. No renal or ureteral calculi are noted. Urinary bladder is unremarkable. Stomach/Bowel: Stomach is within normal limits. Appendix appears normal. No evidence of bowel wall thickening, distention, or inflammatory changes. Vascular/Lymphatic: Aortic atherosclerosis. No  enlarged abdominal or pelvic lymph nodes. Reproductive: Uterus and bilateral adnexa are unremarkable. Other: No abdominal wall hernia or abnormality. No abdominopelvic ascites. Musculoskeletal: No acute or significant osseous findings. IMPRESSION: 1. 2.3 cm partially exophytic rounded abnormality is seen arising posteriorly from the lower pole of left kidney. It appears to be solid in density and is concerning for possible neoplasm or malignancy. Further evaluation with MRI with and without gadolinium administration is recommended. 2. Aortic atherosclerosis. 3. No other abnormality seen in the abdomen or pelvis. Aortic Atherosclerosis (ICD10-I70.0). Electronically Signed   By: Marijo Conception M.D.   On: 12/29/2019 11:16  ____________________________________________   PROCEDURES  Procedure(s) performed:   Procedures  CRITICAL CARE Performed by: Margette Fast Total critical care time: 35 minutes Critical care time was exclusive of separately billable procedures and treating other patients. Critical care was necessary to treat or prevent imminent or life-threatening deterioration. Critical care was time spent personally by me on the following activities: development of treatment plan with patient and/or surrogate as well as nursing, discussions with consultants, evaluation of patient's response to treatment, examination of patient, obtaining history from patient or surrogate, ordering and performing treatments and interventions, ordering and review of laboratory studies, ordering and review of radiographic studies, pulse oximetry and re-evaluation of patient's condition.  Nanda Quinton, MD Emergency Medicine  ____________________________________________   INITIAL IMPRESSION / ASSESSMENT AND PLAN / ED COURSE  Pertinent labs & imaging results that were available during my care of the patient were reviewed by me and considered in my medical decision making (see chart for details).   Patient  presents to the emergency department with rash to the right inguinal crease as pictured.  This is underneath the pannus and may have started as a Candida type infection but appears to be secondarily infected at this time with a large surface area of tissue involved.  Patient has very poor vasculature at baseline and is end-stage renal disease complicating a wound like this.  Plan for noncontrast CT as the patient still makes urine daily to assess for any deeper invasion of tissues and rule out associated gas with this infection.  Not feel crepitus on exam.  Patient is afebrile with otherwise normal vital signs.   CT imaging reviewed.  No evidence of abscess or air in the wound.  Patient does have a renal mass which require additional work-up and recommended MRI on nonemergent basis.  Patient has significant leukocytosis.  Lab has had difficulty getting a lactic acidosis.  Will give gentle IV fluid bolus with dialysis due today and broad-spectrum antibiotics.   Discussed patient's case with [facility], Dr. Dyann Kief to request admission. Patient and family (if present) updated with plan. Care transferred to [facility] service.  I reviewed all nursing notes, vitals, pertinent old records, EKGs, labs, imaging (as available).  Discussed case with Dr. Marval Regal with Nephrology. They will consult for HD.   ____________________________________________  FINAL CLINICAL IMPRESSION(S) / ED DIAGNOSES  Final diagnoses:  Abdominal wall cellulitis    MEDICATIONS GIVEN DURING THIS VISIT:  Medications  sodium chloride 0.9 % bolus 250 mL (250 mLs Intravenous New Bag/Given 12/29/19 1226)  vancomycin (VANCOREADY) IVPB 2000 mg/400 mL (has no administration in time range)  heparin injection 5,000 Units (has no administration in time range)  insulin aspart (novoLOG) injection 0-15 Units (has no administration in time range)  insulin aspart (novoLOG) injection 0-5 Units (has no administration in time range)  cefTRIAXone (ROCEPHIN)  2 g in sodium chloride 0.9 % 100 mL IVPB (0 g Intravenous Stopped 12/29/19 1305)    Note:  This document was prepared using Dragon voice recognition software and may include unintentional dictation errors.  Nanda Quinton, MD, [facility] Emergency Medicine    Coston Mandato, [facility], MD 12/29/19 1324

## 2019-12-29 NOTE — Procedures (Signed)
     HEMODIALYSIS TREATMENT NOTE:  Uneventful 3.5 hour low-heparin treatment completed via RUE AV loop graft with retrograde flow (15g).  Goal met: 3 liters removed without interruption in ultrafiltration.  Vancomycin 1g and Fortaz 2g given at end of dialysis.  All blood was returned and hemostasis was achieved in 15 minutes.  No changes from pre-HD assessment.  Rockwell Alexandria, RN

## 2019-12-29 NOTE — ED Notes (Signed)
Patient was a very difficult stick.  Hard to get lab work.  RN was not able to obtain a lactic tube neither was lab.

## 2019-12-29 NOTE — H&P (Addendum)
History and Physical    Christy Zhang ZOX:[id number] DOB: [birth year] DOA: 12/29/2019  PCP: Claretta Fraise, MD   Patient coming from: Home  Chief Complaint: Abdominal panniculitis  HPI: Christy Zhang is a 64 y.o. female with medical history significant of type 2 diabetes, end-stage renal disease, hypertension, hyperlipidemia, diastolic heart failure, secondary hypothyroidism and chronic anemia; who presented to the emergency department secondary to 2-week history of a rash on her right lower abdomen.  Patient reports area has been affected pretty much right after she ended having section shave for some vascular procedure as an outpatient.  Initially started with chills on itching that has steadily progressed to supportive cellulitic process with active drainage and foul smell.  Patient reports no fever, no chills, no nausea, no vomiting, no diarrhea, no abdominal pain.  She had not had any sick contact and expressed no other acute complaints.  ED Course: In the ED patient was found with a WBCs of 20.5; normal lactic acid, CT abdomen demonstrated no abscess or deeper tissue infection; started on IV antibiotics and TRH consulted to facilitate further management of acute cellulitis/panniculitis process.  Review of Systems: As per HPI otherwise all other systems reviewed and are negative.   Past Medical History:  Diagnosis Date  . Anemia   . Arthritis   . Asthma    in the past  . CHF (congestive heart failure) ([place])   . Chronic kidney disease    Renal failure, dialysis on M/W/F  . 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 End stage renal disease ([place])   . End stage renal disease on dialysis ([place])   . Hemodialysis access, arteriovenous graft ([place])   . Hyperlipidemia   . Hypertension   . Neuromuscular disorder [facility])     Past Surgical History:  Procedure Laterality Date  . A/V FISTULAGRAM Right 02/04/2017   Procedure: A/V Fistulagram;  Surgeon: Waynetta Sandy, MD;  Location: [place] [facility];  Service: Cardiovascular;  Laterality: Right;  . ABDOMINAL AORTOGRAM W/LOWER EXTREMITY N/A 11/18/2019   Procedure: ABDOMINAL AORTOGRAM W/LOWER EXTREMITY;  Surgeon: [name], MD;  Location: [place] [facility];  Service: Cardiovascular;  Laterality: N/A;  . AV FISTULA PLACEMENT Right 08/25/2014   Procedure: RIGHT BRACHIOCEPHALIC ARTERIOVENOUS (AV) FISTULA CREATION;  Surgeon: Mal Misty, MD;  Location: [facility] OR;  Service: Vascular;  Laterality: Right;  . AV FISTULA PLACEMENT Right 03/18/2019   Procedure: INSERTION OF ARTERIOVENOUS (AV) GORE-TEX GRAFT RIGHT ARM;  Surgeon: Serafina Mitchell, MD;  Location: [place];  Service: Vascular;  Laterality: Right;  . BASCILIC VEIN TRANSPOSITION Right 12/24/2018   Procedure: FIRST STAGE BASILIC VEIN TRANSPOSITION RIGHT UPPER ARM;  Surgeon: Serafina Mitchell, MD;  Location: [place];  Service: Vascular;  Laterality: Right;  . FISTULA SUPERFICIALIZATION Right 11/03/2014   Procedure: RIGHT UPPER ARM FISTULA SUPERFICIALIZATION;  Surgeon: Mal Misty, MD;  Location: [place];  Service: Vascular;  Laterality: Right;  . FISTULOGRAM Right 01/16/2016   Procedure: RIGHT UPPER EXTREMITY ARTERIOVENOUS FISTULOGRAM WITH BALLOON ANGIOPLASTY;  Surgeon: Vickie Epley, MD;  Location: [facility];  Service: Vascular;  Laterality: Right;  . INSERTION OF DIALYSIS CATHETER     X4  . IR FLUORO GUIDE CV LINE RIGHT  12/03/2018  . IR REMOVAL TUN CV CATH W/O FL  07/06/2019  . IR THROMBECTOMY AV FISTULA W/THROMBOLYSIS/PTA INC/SHUNT/IMG RIGHT Right 12/03/2018  . IR THROMBECTOMY AV FISTULA W/THROMBOLYSIS/PTA/STENT INC/SHUNT/IMG RT Right 10/11/2019  . IR US  GUIDE VASC ACCESS RIGHT  12/03/2018  . IR US GUIDE VASC ACCESS RIGHT  12/03/2018  . IR US GUIDE VASC ACCESS RIGHT  10/14/2019  . LIGATION OF COMPETING BRANCHES OF ARTERIOVENOUS FISTULA Right 11/03/2014   Procedure: LIGATION OF COMPETING BRANCHE x1 OF RIGHT UPPER ARM ARTERIOVENOUS FISTULA;  Surgeon: Mal Misty, MD;  Location: [place];  Service: Vascular;  Laterality: Right;  . PERIPHERAL VASCULAR BALLOON ANGIOPLASTY Right 02/04/2017   Procedure: PERIPHERAL VASCULAR BALLOON ANGIOPLASTY;  Surgeon: Waynetta Sandy, MD;  Location: [place] [facility];  Service: Cardiovascular;  Laterality: Right;  . PERIPHERAL VASCULAR BALLOON ANGIOPLASTY Right 11/18/2019   Procedure: PERIPHERAL VASCULAR BALLOON ANGIOPLASTY;  Surgeon: [name], MD;  Location: [place] [facility];  Service: Cardiovascular;  Laterality: Right;  anterior tibial  . TUBAL LIGATION      Social History  reports that she quit smoking about 35 years ago. Her smoking use included cigarettes. She has never used smokeless tobacco. She reports that she does not drink alcohol and does not use drugs.  Allergies  Allergen Reactions  . Sulfa Antibiotics Swelling    Family History  Problem Relation Age of Onset  . Diabetes Mother   . Hypertension Mother   . Cancer Father   . Diabetes Father   . Hypertension Sister   . Diabetes Daughter   . Heart disease Daughter   . Hypertension Daughter   . Diabetes Son   . Heart disease Son   . Hypertension Son   . Peripheral vascular disease Son        amputation    Prior to Admission medications   Medication Sig Start Date End Date Taking? Authorizing Provider  acetaminophen (TYLENOL) 325 MG tablet Take 2 tablets (650 mg total) by mouth every 6 (six) hours as needed for mild pain (or Fever >/= 101). 10/15/19  Yes Emokpae, Courage, MD  amLODipine (NORVASC) 5 MG tablet Take 1 tablet (5 mg total) by mouth at bedtime. 08/17/19  Yes Claretta Fraise, MD  atorvastatin (LIPITOR) 40 MG tablet Take 1 tablet (40 mg total) by mouth daily. Patient taking differently: Take 40 mg by mouth at bedtime.  08/17/19  Yes Claretta Fraise, MD  clopidogrel (PLAVIX) 75 MG tablet Take 1 tablet (75 mg total) by mouth daily. 11/18/19 11/17/20 Yes [name], MD  diphenhydrAMINE (BENADRYL) 25 MG  tablet Take 25 mg by mouth every 6 (six) hours as needed for itching.   Yes [provider]  Dulaglutide (TRULICITY) 1.5 ZO/1.0RU SOPN Inject content of one pen under the skin weekly on Saturday Patient taking differently: Inject 1.5 mg into the skin once a week. Saturday 08/17/19  Yes [name], [name], MD  insulin glargine, 1 Unit Dial, (TOUJEO SOLOSTAR) 300 UNIT/ML Solostar Pen Inject 100 Units into the skin daily. Patient taking differently: Inject 100 Units into the skin in the morning.  08/17/19  Yes [name], [name], MD  metoprolol succinate (TOPROL-XL) 100 MG 24 hr tablet TAKE ONE TABLET BY MOUTH DAILY. TAKE WITH OR IMMEDIATELY FOLLOWING A MEAL. Patient taking differently: Take 100 mg by mouth at bedtime. Marland Kitchen TAKE WITH OR IMMEDIATELY FOLLOWING A MEAL. 08/17/19  Yes [name], [name], MD  multivitamin (RENA-VIT) TABS tablet Take 1 tablet by mouth at bedtime.    Yes [provider]  sevelamer carbonate (RENVELA) 800 MG tablet Take 1,600-2,400 mg by mouth See admin instructions. Take 2400 mg with each meal and 1600 mg with each snack   Yes [provider]  Physical Exam: Vitals:   12/29/19 1615 12/29/19 1700 12/29/19 1730 12/29/19 1745  BP:  (!) 148/53 (!) 134/31 (!) 126/48  Pulse: 80 80 80 77  Resp: 17 19 18 20   Temp:    98.2 F (36.8 C)  TempSrc:    Oral  SpO2: 99% 99% 99% 100%  Weight:      Height:        Constitutional: NAD, calm, comfortable; no nausea, no vomiting.  Patient is afebrile. Vitals:   12/29/19 1615 12/29/19 1700 12/29/19 1730 12/29/19 1745  BP:  (!) 148/53 (!) 134/31 (!) 126/48  Pulse: 80 80 80 77  Resp: 17 19 18 20   Temp:    98.2 F (36.8 C)  TempSrc:    Oral  SpO2: 99% 99% 99% 100%  Weight:      Height:       Eyes: PERRL, lids and conjunctivae normal ENMT: Mucous membranes are moist. Posterior pharynx clear of any exudate or lesions. Neck: normal, supple, no masses, no thyromegaly Respiratory: clear to auscultation bilaterally, no  wheezing, no crackles. Normal respiratory effort. No accessory muscle use.  Good oxygen saturation on room air. Cardiovascular: Regular rate and rhythm, no murmurs / rubs / gallops. No extremity edema. 2+ pedal pulses. No carotid bruits.  Abdomen: Obese, no tenderness, no masses palpated. No hepatosplenomegaly. Bowel sounds positive.  Musculoskeletal: No edema, no clubbing. Positive gangrenous changes on third toe bilaterally. Please refer to media section for images..  Good range of motion and no contractures appreciated. Skin: See epic media for picture demonstrating abnormalities on her right lower abdominal aspect with active panniculitis. 3rd toe bilaterally with gangrenous changes (present on admission) Neurologic: CN grossly intact; aside from being legally blind.  No focal neurologic deficit appreciated.  Muscle strength 4 out of 5 bilaterally and symmetrically in the setting of poor effort. Psychiatric: Normal judgment and insight. Alert and oriented x 3. Normal mood.    Labs on Admission: I have personally reviewed following labs and imaging studies  CBC: Recent Labs  Lab 12/29/19 1047  WBC 20.5*  NEUTROABS 16.5*  HGB 8.2*  HCT 27.5*  MCV 89.9  PLT 825*    Basic Metabolic Panel: Recent Labs  Lab 12/29/19 1047  NA 138  K 3.9  CL 96*  CO2 24  GLUCOSE 61*  BUN 33*  CREATININE 9.53*  CALCIUM 9.4    GFR: Estimated Creatinine Clearance: 7.3 mL/min (A) (by C-G formula based on SCr of 9.53 mg/dL (H)).  Liver Function Tests: Recent Labs  Lab 12/29/19 1047  AST 7*  ALT 8  ALKPHOS 51  BILITOT 0.4  PROT 9.0*  ALBUMIN 2.9*    Urine analysis: No results found for: COLORURINE, APPEARANCEUR, LABSPEC, [place], GLUCOSEU, HGBUR, BILIRUBINUR, KETONESUR, PROTEINUR, UROBILINOGEN, NITRITE, LEUKOCYTESUR  Radiological Exams on Admission: CT ABDOMEN PELVIS WO CONTRAST  Result Date: 12/29/2019 CLINICAL DATA:  Acute right lower quadrant abdominal pain. EXAM: CT ABDOMEN AND  PELVIS WITHOUT CONTRAST TECHNIQUE: Multidetector CT imaging of the abdomen and pelvis was performed following the standard protocol without IV contrast. COMPARISON:  None. FINDINGS: Lower chest: No acute abnormality. Hepatobiliary: No focal liver abnormality is seen. No gallstones, gallbladder wall thickening, or biliary dilatation. Pancreas: Unremarkable. No pancreatic ductal dilatation or surrounding inflammatory changes. Spleen: Normal in size without focal abnormality. Adrenals/Urinary Tract: Adrenal glands are unremarkable. 2.3 cm partially exophytic rounded abnormality is seen arising posteriorly from the lower pole of left kidney. It appears to be solid in density and is concerning for  possible neoplasm or malignancy. Right kidney is unremarkable. No hydronephrosis or renal obstruction is noted. No renal or ureteral calculi are noted. Urinary bladder is unremarkable. Stomach/Bowel: Stomach is within normal limits. Appendix appears normal. No evidence of bowel wall thickening, distention, or inflammatory changes. Vascular/Lymphatic: Aortic atherosclerosis. No enlarged abdominal or pelvic lymph nodes. Reproductive: Uterus and bilateral adnexa are unremarkable. Other: No abdominal wall hernia or abnormality. No abdominopelvic ascites. Musculoskeletal: No acute or significant osseous findings. IMPRESSION: 1. 2.3 cm partially exophytic rounded abnormality is seen arising posteriorly from the lower pole of left kidney. It appears to be solid in density and is concerning for possible neoplasm or malignancy. Further evaluation with MRI with and without gadolinium administration is recommended. 2. Aortic atherosclerosis. 3. No other abnormality seen in the abdomen or pelvis. Aortic Atherosclerosis (ICD10-I70.0). Electronically Signed   By: Marijo Conception M.D.   On: 12/29/2019 11:16    EKG: none   Assessment/Plan 1-panniculitis -Affecting right lower abdomen and groin area -Full smell and elevated WBCs  appreciated on work-up -Patient with underlying history of diabetes and end-stage renal disease with vasculopathy -Given concern for polymicrobial and Pseudomonas patient has been started on vancomycin and for text -Wound care service has been consulted -Will follow clinical response and further recommendations for treatment. -Patient did not meet criteria for sepsis on presentation.  2-type 2 diabetes with ESRD -Most recent A1c demonstrated a level of 6.4 -Continue sliding scale insulin -Modified carbohydrate diet has been ordered.  3-ESRD -Patient's usual dialysis day Monday, Wednesday and Friday -Nephrology service has been consulted and will follow recommendation for dialysis management while inpatient  4-hypertension -Stable overall and well-controlled -Continue current antihypertensive agents.  5-morbid obesity -Body mass index is 40.85 kg/m. -Low calorie and portion control has been discussed with patient.  6-anemia of chronic kidney disease -Hemoglobin below baseline -No overt bleeding appreciated -Will follow IV iron and Epogen therapy as per renal service discretion.  7-secondary hyperparathyroidism -Continue the use of Renvela  8-hyperlipidemia -Continue statins  9-leukocytosis -In the setting of acute cellulitis/panniculitis -We will follow cellulitis protocol -IV antibiotics started -Wound care service has been consulted -Follow clinical response.  DVT prophylaxis: Heparin Code Status:   Full code Family Communication:  No family member at bedside. Disposition Plan:   Patient is from:  Home  Anticipated DC to:  Home  Anticipated DC date:  9/3>>9/4, based on clinical response and stability.  Anticipated DC barriers: Stabilization of patient's active panniculitis process. Consults called:  Nephrology service (Dr. Arty Baumgartner). Admission status:  Inpatient, length of stay more than 2 midnights, MedSurg bed.  Severity of Illness: Severe severity; patient  with underlying history of diabetes mellitus and end-stage renal disease with vasculopathy presenting with acute panniculitis process extending into her right lower abdomen and groin.  There is active purulence and the skin breakdown appreciated.  Patient will require IV antibiotics therapy and close monitoring as she is at high risk for decompensation.    Barton Dubois MD [facility]  How to contact the [facility] Attending or Consulting provider [place] or covering provider during after hours [place], for this patient?   1. Check the care team in [facility] and look for a) attending/consulting TR[facility] provider listed and b) the [facility] team listed 2. Log into [email or web address] and use [place]'s universal password to access. If you do not have the password, please contact the hospital operator. 3. Locate the [facility] provider you are looking for under [facility] and page to  a number that you can be directly reached. 4. If you still have difficulty reaching the provider, please page the [facility] (Director on Call) for the Hospitalists listed on amion for assistance.  12/29/2019, 7:19 PM

## 2019-12-29 NOTE — ED Notes (Signed)
Per pharmacy and nephrology hold second gram of vanco until after dialysis.

## 2019-12-29 NOTE — Progress Notes (Signed)
Pharmacy Antibiotic Note  Christy Zhang is a 64 y.o. female admitted on 12/29/2019 with cellulitis/wound infection.  Pharmacy has been consulted for Vancomycin and Ceftazidime dosing. Patient to receive HD today.(usual schedule is MWF) Plan: Vancomycin 1gm IV given in ED, additional 1gm dose to be given aft HD today and after HD on MWF Ceftazidime 2gm IV after HD MWF F/U cxs and clinical progress Monitor V/S, lab and levels as indicated  Height: 5\' 4"  (162.6 cm) Weight: 108 kg (238 lb) IBW/kg (Calculated) : 54.7  Temp (24hrs), Avg:98.5 F (36.9 C), Min:98.4 F (36.9 C), Max:98.6 F (37 C)  Recent Labs  Lab 12/29/19 1047 12/29/19 1359  WBC 20.5*  --   CREATININE 9.53*  --   LATICACIDVEN  --  1.0    Estimated Creatinine Clearance: 7.3 mL/min (A) (by C-G formula based on SCr of 9.53 mg/dL (H)).    Allergies  Allergen Reactions  . Sulfa Antibiotics Swelling    Antimicrobials this admission: Vancomycin 9/1 >>  Ceftazidime 9/1>>  Ceftriaxone 2gm IV x 1 dose 9/1  Microbiology results: 9/1 BCx: pending 9/1 SARS CV is negative  MRSA PCR:   Thank you for allowing pharmacy to be a part of this patient's care.  Isac Sarna, BS Pharm D, [place] Clinical Pharmacist Pager [phone] 12/29/2019 3:02 PM

## 2019-12-29 NOTE — ED Triage Notes (Signed)
Pt reports rash to right lower abdomen/groin x2 weeks. Pt reports history of same and denies any n/v/fevers/chills.

## 2019-12-29 NOTE — ED Notes (Signed)
Returned from CT.

## 2019-12-29 NOTE — ED Notes (Signed)
Sugar 48, repeat 43, started hypoglycemia order set, gave 8oz of ginger ale.  Notified MD

## 2019-12-29 NOTE — Consult Note (Signed)
[facility] Renal Consultation Note    Indication for Consultation:  Management of ESRD/hemodialysis; anemia, hypertension/volume and secondary hyperparathyroidism  HPI: Christy Zhang is a 64 y.o. female with a PMH significant for DM type II, HTN, CHF, HLD, and ESRD who presented to [facility] ED with a 2 week history of a rash on her right lower abdomen that was starting to have a foul odor and wanted to have it checked out.  She reports some pain on palpation but denies any fever, chills, nausea, vomiting, diarrhea, or abdominal pain.  In the ED she was noted to have a large area of maceration of the right inguinal crease with skin breakdown and foul-smelling biofilm present with some tenderness to palpation.  She was afebrile and vital signs were stable, however her WBC was elevated at 20.5.  We were consulted to help provide dialysis while she remains an inpatient as well as dosing antibiotics.   Past Medical History:  Diagnosis Date  . Anemia   . Arthritis   . Asthma    in the past  . CHF (congestive heart failure) ([place])   . Chronic kidney disease    Renal failure, dialysis on M/W/F  . 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 End stage renal disease ([place])   . End stage renal disease on dialysis ([place])   . Hemodialysis access, arteriovenous graft ([place])   . Hyperlipidemia   . Hypertension   . Neuromuscular disorder [facility])    Past Surgical History:  Procedure Laterality Date  . A/V FISTULAGRAM Right 02/04/2017   Procedure: A/V Fistulagram;  Surgeon: Waynetta Sandy, MD;  Location: [place] [facility];  Service: Cardiovascular;  Laterality: Right;  . ABDOMINAL AORTOGRAM W/LOWER EXTREMITY N/A 11/18/2019   Procedure: ABDOMINAL AORTOGRAM W/LOWER EXTREMITY;  Surgeon: [name], MD;  Location: [place] [facility];  Service: Cardiovascular;  Laterality: N/A;  . AV FISTULA PLACEMENT Right 08/25/2014   Procedure: RIGHT BRACHIOCEPHALIC ARTERIOVENOUS  (AV) FISTULA CREATION;  Surgeon: Mal Misty, MD;  Location[facility] OR;  Service: Vascular;  Laterality: Right;  . AV FISTULA PLACEMENT Right 03/18/2019   Procedure: INSERTION OF ARTERIOVENOUS (AV) GORE-TEX GRAFT RIGHT ARM;  Surgeon: Serafina Mitchell, MD;  Location: [place];  Service: Vascular;  Laterality: Right;  . BASCILIC VEIN TRANSPOSITION Right 12/24/2018   Procedure: FIRST STAGE BASILIC VEIN TRANSPOSITION RIGHT UPPER ARM;  Surgeon: Serafina Mitchell, MD;  Location: [place];  Service: Vascular;  Laterality: Right;  . FISTULA SUPERFICIALIZATION Right 11/03/2014   Procedure: RIGHT UPPER ARM FISTULA SUPERFICIALIZATION;  Surgeon: Mal Misty, MD;  Location: [place];  Service: Vascular;  Laterality: Right;  . FISTULOGRAM Right 01/16/2016   Procedure: RIGHT UPPER EXTREMITY ARTERIOVENOUS FISTULOGRAM WITH BALLOON ANGIOPLASTY;  Surgeon: Vickie Epley, MD;  Location: [facility];  Service: Vascular;  Laterality: Right;  . INSERTION OF DIALYSIS CATHETER     X4  . IR FLUORO GUIDE CV LINE RIGHT  12/03/2018  . IR REMOVAL TUN CV CATH W/O FL  07/06/2019  . IR THROMBECTOMY AV FISTULA W/THROMBOLYSIS/PTA INC/SHUNT/IMG RIGHT Right 12/03/2018  . IR THROMBECTOMY AV FISTULA W/THROMBOLYSIS/PTA/STENT INC/SHUNT/IMG RT Right 10/11/2019  . IR US GUIDE VASC ACCESS RIGHT  12/03/2018  . IR US GUIDE VASC ACCESS RIGHT  12/03/2018  . IR US GUIDE VASC ACCESS RIGHT  10/14/2019  . LIGATION OF COMPETING BRANCHES OF ARTERIOVENOUS FISTULA Right 11/03/2014   Procedure: LIGATION OF COMPETING BRANCHE x1 OF RIGHT UPPER ARM ARTERIOVENOUS  FISTULA;  Surgeon: Mal Misty, MD;  Location: [place];  Service: Vascular;  Laterality: Right;  . PERIPHERAL VASCULAR BALLOON ANGIOPLASTY Right 02/04/2017   Procedure: PERIPHERAL VASCULAR BALLOON ANGIOPLASTY;  Surgeon: Waynetta Sandy, MD;  Location: [place] [facility];  Service: Cardiovascular;  Laterality: Right;  . PERIPHERAL VASCULAR BALLOON ANGIOPLASTY Right 11/18/2019   Procedure: PERIPHERAL VASCULAR  BALLOON ANGIOPLASTY;  Surgeon: [name], MD;  Location: [place] [facility];  Service: Cardiovascular;  Laterality: Right;  anterior tibial  . TUBAL LIGATION     Family History:   Family History  Problem Relation Age of Onset  . Diabetes Mother   . Hypertension Mother   . Cancer Father   . Diabetes Father   . Hypertension Sister   . Diabetes Daughter   . Heart disease Daughter   . Hypertension Daughter   . Diabetes Son   . Heart disease Son   . Hypertension Son   . Peripheral vascular disease Son        amputation   Social History:  reports that she quit smoking about 35 years ago. Her smoking use included cigarettes. She has never used smokeless tobacco. She reports that she does not drink alcohol and does not use drugs. Allergies  Allergen Reactions  . Sulfa Antibiotics Swelling   Prior to Admission medications   Medication Sig Start Date End Date Taking? Authorizing Provider  acetaminophen (TYLENOL) 325 MG tablet Take 2 tablets (650 mg total) by mouth every 6 (six) hours as needed for mild pain (or Fever >/= 101). 10/15/19  Yes Emokpae, Courage, MD  amLODipine (NORVASC) 5 MG tablet Take 1 tablet (5 mg total) by mouth at bedtime. 08/17/19  Yes Claretta Fraise, MD  atorvastatin (LIPITOR) 40 MG tablet Take 1 tablet (40 mg total) by mouth daily. Patient taking differently: Take 40 mg by mouth at bedtime.  08/17/19  Yes Claretta Fraise, MD  clopidogrel (PLAVIX) 75 MG tablet Take 1 tablet (75 mg total) by mouth daily. 11/18/19 11/17/20 Yes [name], MD  diphenhydrAMINE (BENADRYL) 25 MG tablet Take 25 mg by mouth every 6 (six) hours as needed for itching.   Yes [provider]  Dulaglutide (TRULICITY) 1.5 JH/4.1DE SOPN Inject content of one pen under the skin weekly on Saturday Patient taking differently: Inject 1.5 mg into the skin once a week. Saturday 08/17/19  Yes [name], [name], MD  insulin glargine, 1 Unit Dial, (TOUJEO SOLOSTAR) 300 UNIT/ML Solostar Pen  Inject 100 Units into the skin daily. Patient taking differently: Inject 100 Units into the skin in the morning.  08/17/19  Yes [name], [name], MD  metoprolol succinate (TOPROL-XL) 100 MG 24 hr tablet TAKE ONE TABLET BY MOUTH DAILY. TAKE WITH OR IMMEDIATELY FOLLOWING A MEAL. Patient taking differently: Take 100 mg by mouth at bedtime. Marland Kitchen TAKE WITH OR IMMEDIATELY FOLLOWING A MEAL. 08/17/19  Yes [name], [name], MD  multivitamin (RENA-VIT) TABS tablet Take 1 tablet by mouth at bedtime.    Yes [provider]  sevelamer carbonate (RENVELA) 800 MG tablet Take 1,600-2,400 mg by mouth See admin instructions. Take 2400 mg with each meal and 1600 mg with each snack   Yes [provider]   Current Facility-Administered Medications  Medication Dose Route Frequency Provider Last Rate Last Admin  . 0.9 %  sodium chloride infusion  250 mL Intravenous PRN [name], MD      . heparin injection 5,000 Units  5,000 Units Subcutaneous Q8H Barton Dubois, MD      .  insulin aspart (novoLOG) injection 0-15 Units  0-15 Units Subcutaneous TID WC Barton Dubois, MD      . insulin aspart (novoLOG) injection 0-5 Units  0-5 Units Subcutaneous QHS Barton Dubois, MD      . sodium chloride flush (NS) 0.9 % injection 3 mL  3 mL Intravenous Q12H [name], MD      . sodium chloride flush (NS) 0.9 % injection 3 mL  3 mL Intravenous PRN [name], MD      . vancomycin (VANCOCIN) IVPB 1000 mg/200 mL premix  1,000 mg Intravenous Once Barton Dubois, MD       Current Outpatient Medications  Medication Sig Dispense Refill  . acetaminophen (TYLENOL) 325 MG tablet Take 2 tablets (650 mg total) by mouth every 6 (six) hours as needed for mild pain (or Fever >/= 101). 12 tablet 0  . amLODipine (NORVASC) 5 MG tablet Take 1 tablet (5 mg total) by mouth at bedtime. 90 tablet 1  . atorvastatin (LIPITOR) 40 MG tablet Take 1 tablet (40 mg total) by mouth daily. (Patient taking differently: Take  40 mg by mouth at bedtime. ) 90 tablet 3  . clopidogrel (PLAVIX) 75 MG tablet Take 1 tablet (75 mg total) by mouth daily. 30 tablet 11  . diphenhydrAMINE (BENADRYL) 25 MG tablet Take 25 mg by mouth every 6 (six) hours as needed for itching.    . Dulaglutide (TRULICITY) 1.5 YB/6.3SL SOPN Inject content of one pen under the skin weekly on Saturday (Patient taking differently: Inject 1.5 mg into the skin once a week. Saturday) 4 pen 12  . insulin glargine, 1 Unit Dial, (TOUJEO SOLOSTAR) 300 UNIT/ML Solostar Pen Inject 100 Units into the skin daily. (Patient taking differently: Inject 100 Units into the skin in the morning. ) 10 mL 2  . metoprolol succinate (TOPROL-XL) 100 MG 24 hr tablet TAKE ONE TABLET BY MOUTH DAILY. TAKE WITH OR IMMEDIATELY FOLLOWING A MEAL. (Patient taking differently: Take 100 mg by mouth at bedtime. Marland Kitchen TAKE WITH OR IMMEDIATELY FOLLOWING A MEAL.) 90 tablet 1  . multivitamin (RENA-VIT) TABS tablet Take 1 tablet by mouth at bedtime.     . sevelamer carbonate (RENVELA) 800 MG tablet Take 1,600-2,400 mg by mouth See admin instructions. Take 2400 mg with each meal and 1600 mg with each snack     Labs: Basic Metabolic Panel: Recent Labs  Lab 12/29/19 1047  NA 138  K 3.9  CL 96*  CO2 24  GLUCOSE 61*  BUN 33*  CREATININE 9.53*  CALCIUM 9.4   Liver Function Tests: Recent Labs  Lab 12/29/19 1047  AST 7*  ALT 8  ALKPHOS 51  BILITOT 0.4  PROT 9.0*  ALBUMIN 2.9*   No results for input(s): LIPASE, AMYLASE in the last 168 hours. No results for input(s): AMMONIA in the last 168 hours. CBC: Recent Labs  Lab 12/29/19 1047  WBC 20.5*  NEUTROABS 16.5*  HGB 8.2*  HCT 27.5*  MCV 89.9  PLT 825*   Cardiac Enzymes: No results for input(s): CKTOTAL, CKMB, CKMBINDEX, TROPONINI in the last 168 hours. CBG: No results for input(s): GLUCAP in the last 168 hours. Iron Studies: No results for input(s): IRON, TIBC, TRANSFERRIN, FERRITIN in the last 72 hours. Studies/Results: CT  ABDOMEN PELVIS WO CONTRAST  Result Date: 12/29/2019 CLINICAL DATA:  Acute right lower quadrant abdominal pain. EXAM: CT ABDOMEN AND PELVIS WITHOUT CONTRAST TECHNIQUE: Multidetector CT imaging of the abdomen and pelvis was performed following the standard protocol without  IV contrast. COMPARISON:  None. FINDINGS: Lower chest: No acute abnormality. Hepatobiliary: No focal liver abnormality is seen. No gallstones, gallbladder wall thickening, or biliary dilatation. Pancreas: Unremarkable. No pancreatic ductal dilatation or surrounding inflammatory changes. Spleen: Normal in size without focal abnormality. Adrenals/Urinary Tract: Adrenal glands are unremarkable. 2.3 cm partially exophytic rounded abnormality is seen arising posteriorly from the lower pole of left kidney. It appears to be solid in density and is concerning for possible neoplasm or malignancy. Right kidney is unremarkable. No hydronephrosis or renal obstruction is noted. No renal or ureteral calculi are noted. Urinary bladder is unremarkable. Stomach/Bowel: Stomach is within normal limits. Appendix appears normal. No evidence of bowel wall thickening, distention, or inflammatory changes. Vascular/Lymphatic: Aortic atherosclerosis. No enlarged abdominal or pelvic lymph nodes. Reproductive: Uterus and bilateral adnexa are unremarkable. Other: No abdominal wall hernia or abnormality. No abdominopelvic ascites. Musculoskeletal: No acute or significant osseous findings. IMPRESSION: 1. 2.3 cm partially exophytic rounded abnormality is seen arising posteriorly from the lower pole of left kidney. It appears to be solid in density and is concerning for possible neoplasm or malignancy. Further evaluation with MRI with and without gadolinium administration is recommended. 2. Aortic atherosclerosis. 3. No other abnormality seen in the abdomen or pelvis. Aortic Atherosclerosis (ICD10-I70.0). Electronically Signed   By: Marijo Conception M.D.   On: 12/29/2019 11:16     ROS: Pertinent items are noted in HPI. Physical Exam: Vitals:   12/29/19 0903 12/29/19 0904 12/29/19 1333  BP: (!) 126/105  (!) 158/64  Pulse: 84  80  Resp: 18  20  Temp: 98.6 F (37 C)  98.4 F (36.9 C)  TempSrc: Oral  Oral  SpO2: 100%  100%  Weight:  108 kg   Height:  5\' 4"  (1.626 m)       Weight change:  No intake or output data in the 24 hours ending 12/29/19 1344 BP (!) 158/64 (BP Location: Left Wrist)   Pulse 80   Temp 98.4 F (36.9 C) (Oral)   Resp 20   Ht 5\' 4"  (1.626 m)   Wt 108 kg   LMP  (LMP Unknown)   SpO2 100%   BMI 40.85 kg/m  General appearance: alert, cooperative, no distress and morbidly obese Head: Normocephalic, without obvious abnormality, atraumatic Eyes: negative findings: lids and lashes normal, conjunctivae and sclerae normal and corneas clear Resp: clear to auscultation bilaterally Cardio: regular rate and rhythm, S1, S2 normal, no murmur, click, rub or gallop GI: obese, +BS, soft, area of erythema/maceration, foul-smelling biofilm along the right inguinal crease and overlying pannus, tender to palpation Extremities: edema trace pretibial edema bilaterally and RUE AVG +T/B Dialysis Access: RUE AVG  Dialysis Orders: Center: [facility]  on MWF . EDW 106 kg HD Bath 2K/2.5Ca  Time 4 hours Heparin  1800. Access RUE AVG BFR 400 DFR 600    Hectoral 3.5 mcg IV/HD Epogen 1000   Units IV/HD  Venofer  50 mg IV q week    Assessment/Plan: 1.  Panniculitis/abdominal wall cellulitis- agree with culturing the biofilm and broad spectrum antibiotics.  Would limit vanc to total of 2 grams loading, she already received 1 gram in ED and will give another gram at the end of dialysis today 2.  ESRD -  Plan for HD later today to keep on schedule 3.  Hypertension/volume  -  stable 4.  Anemia  - low Hgb.  Follow H/H and transfuse prn.  Will dose with aranesp today 5.  Metabolic bone  disease -  Cont with home meds.  Need to check phos as this could be  calciphylaxis, although it is only mildly tender 6.  Nutrition - renal, carb modified diet 7. DM type 2- per primary   Donetta Potts, MD [place] Pager [phone] 12/29/2019, 1:44 PM

## 2019-12-29 NOTE — ED Notes (Signed)
Patient transported to CT 

## 2019-12-30 ENCOUNTER — Encounter: Payer: Self-pay | Admitting: Family Medicine

## 2019-12-30 ENCOUNTER — Ambulatory Visit: Payer: Medicare Other | Admitting: Nurse Practitioner

## 2019-12-30 DIAGNOSIS — N2889 Other specified disorders of kidney and ureter: Secondary | ICD-10-CM

## 2019-12-30 DIAGNOSIS — L299 Pruritus, unspecified: Secondary | ICD-10-CM

## 2019-12-30 LAB — RENAL FUNCTION PANEL
Albumin: 2.3 g/dL — ABNORMAL LOW (ref 3.5–5.0)
Anion gap: 12 (ref 5–15)
BUN: 18 mg/dL (ref 8–23)
CO2: 26 mmol/L (ref 22–32)
Calcium: 8.3 mg/dL — ABNORMAL LOW (ref 8.9–10.3)
Chloride: 96 mmol/L — ABNORMAL LOW (ref 98–111)
Creatinine, Ser: 6.13 mg/dL — ABNORMAL HIGH (ref 0.44–1.00)
GFR calc Af Amer: 8 mL/min — ABNORMAL LOW (ref >60)
GFR calc non Af Amer: 7 mL/min — ABNORMAL LOW (ref >60)
Glucose, Bld: 88 mg/dL (ref 70–99)
Phosphorus: 2.5 mg/dL (ref 2.5–4.6)
Potassium: 4 mmol/L (ref 3.5–5.1)
Sodium: 134 mmol/L — ABNORMAL LOW (ref 135–145)

## 2019-12-30 LAB — GLUCOSE, CAPILLARY
Glucose-Capillary: 92 mg/dL (ref 70–99)
Glucose-Capillary: 114 mg/dL — ABNORMAL HIGH (ref 70–99)
Glucose-Capillary: 110 mg/dL — ABNORMAL HIGH (ref 70–99)
Glucose-Capillary: 110 mg/dL — ABNORMAL HIGH (ref 70–99)
Glucose-Capillary: 118 mg/dL — ABNORMAL HIGH (ref 70–99)

## 2019-12-30 LAB — CBC
HCT: 23.2 % — ABNORMAL LOW (ref 36.0–46.0)
Hemoglobin: 7.1 g/dL — ABNORMAL LOW (ref 12.0–15.0)
MCH: 27 pg (ref 26.0–34.0)
MCHC: 30.6 g/dL (ref 30.0–36.0)
MCV: 88.2 fL (ref 80.0–100.0)
Platelets: 626 10*3/uL — ABNORMAL HIGH (ref 150–400)
RBC: 2.63 MIL/uL — ABNORMAL LOW (ref 3.87–5.11)
RDW: 15.4 % (ref 11.5–15.5)
WBC: 17.2 10*3/uL — ABNORMAL HIGH (ref 4.0–10.5)
nRBC: 0 % (ref 0.0–0.2)

## 2019-12-30 MED ORDER — DIPHENHYDRAMINE HCL 25 MG PO CAPS
25.0000 mg | ORAL_CAPSULE | Freq: Four times a day (QID) | ORAL | Status: DC | PRN
  Filled 2019-12-30: qty 1

## 2019-12-30 MED ORDER — DIPHENHYDRAMINE HCL 25 MG PO CAPS
25.0000 mg | ORAL_CAPSULE | Freq: Three times a day (TID) | ORAL | Status: DC | PRN
  Administered 2019-12-30 (×2): 25 mg via ORAL
  Filled 2019-12-30 (×2): qty 1

## 2019-12-30 NOTE — Progress Notes (Signed)
PROGRESS NOTE    Christy Zhang  XTK:[id number] DOB: 02/03/1956 DOA: 12/29/2019 PCP: Claretta Fraise, MD (Confirm with patient/family/NH records and if not entered, this HAS to be entered at [facility] point of entry. "No PCP" if truly none.)   Chief Complaint  Patient presents with  . Rash    Brief Narrative:  Christy Zhang is a 64 y.o. female with medical history significant of type 2 diabetes, end-stage renal disease, hypertension, hyperlipidemia, diastolic heart failure, secondary hypothyroidism and chronic anemia; who presented to the emergency department secondary to 2-week history of a rash on her right lower abdomen.  Patient reports area has been affected pretty much right after she ended having section shave for some vascular procedure as an outpatient.  Initially started with chills on itching that has steadily progressed to supportive cellulitic process with active drainage and foul smell.  Patient reports no fever, no chills, no nausea, no vomiting, no diarrhea, no abdominal pain.  She had not had any sick contact and expressed no other acute complaints.  ED Course: In the ED patient was found with a WBCs of 20.5; normal lactic acid, CT abdomen demonstrated no abscess or deeper tissue infection; started on IV antibiotics and TRH consulted to facilitate further management of acute cellulitis/panniculitis process.  Assessment & Plan: 1-panniculitis -Follow wound care service recommendation -CT images and physical examination no compatible with calciphylaxis; Case also discussed with nephrology service who is in agreement with findings. -Continue current IV antibiotics (vancomycin and Fortaz) -Continue supportive care -Still significant active drainage and foul smell.  2-type 2 diabetes with end-stage renal disease -Most recent A1c 6.4 -Continue sliding scale insulin -Modified carbohydrate diet and renal diet has been ordered on admission. -Patient instructed to maintain  adequate hydration.  3-ESRD -Continue dialysis as per renal service recommendations -Next treatment anticipated 12/31/2019  4-morbid obesity -Body mass index is 40.49 kg/m. -Low calorie diet, portion control and increase physical activity discussed with patient.  5-hypertension -Stable and well-controlled -Continue current antihypertensive agents -Low-sodium diet emphasized.  6-anemia of chronic kidney disease -No overt bleeding appreciated -Continue to follow hemoglobin trend -IV iron and Epogen therapy as per renal service discretion.  7-hyperlipidemia -Continue statins  8-secondary hyperparathyroidism -Continue the use of Renvela  9-leukocytosis -In the setting of acute cellulitis/panniculitis -Trending down appropriately -No fever -Continue current IV antibiotics.  10-left kidney mass -Patient will need outpatient follow-up with urology service for further evaluation and management. -MRI with contrast recommended as an outpatient by radiology service.  DVT prophylaxis: Heparin Code Status: Full code Family Communication: No family at bedside; patient oriented x3 and capable/competent of providing updates to family members. Disposition:   Status is: Inpatient  Dispo: The patient is from: Home              Anticipated d/c is to: Home              Anticipated d/c date is:1-2 days              Patient currently still with significant drainage and foul smell out of active panniculitis process in her abdominal right lower quadrant; will continue IV antibiotics, repeat hemodialysis tomorrow and follow wound care instructions.  If remains stable may be able to go home in the next 24-48 hours with further outpatient antibiotic therapy given during hemodialysis treatment.        Consultants:   Nephrology service   Procedures:  See below for x-ray reports Inpatient hemodialysis  Antimicrobials:  Vancomycin Tressie Ellis  Subjective: Afebrile, no chest pain, no  nausea, no vomiting.  Still having some mild tenderness on palpation in the panniculitis area with active foul smell and drainage.  Objective: Vitals:   12/30/19 0145 12/30/19 0215 12/30/19 0230 12/30/19 0500  BP: (!) 145/65 (!) 128/50 (!) 146/60   Pulse: 80 81 82   Resp:   18   Temp:   98.1 F (36.7 C)   TempSrc:   Oral   SpO2:      Weight:    107 kg  Height:        Intake/Output Summary (Last 24 hours) at 12/30/2019 1801 Last data filed at 12/30/2019 0500 Gross per 24 hour  Intake 300 ml  Output 3012 ml  Net -2712 ml   Filed Weights   12/29/19 0904 12/29/19 2230 12/30/19 0500  Weight: 108 kg 109.7 kg 107 kg    Examination:  General exam: Appears calm and comfortable, afebrile, no nausea, no vomiting.  Reports he still drainage and foul smell around her panniculitis process.  No abdominal pain. Respiratory system: Clear to auscultation. Respiratory effort normal.  No requiring oxygen supplementation; good air movement bilaterally.  No using accessory muscle. Cardiovascular system: S1 & S2 heard, RRR. No JVD, murmurs, rubs, gallops or clicks. No pedal edema. Gastrointestinal system: Abdomen is obese, mildly tender to palpation in affected panniculitis area; no guarding, positive bowel sounds.  Central nervous system: Alert and oriented. No focal neurological deficits. Extremities: No clubbing, no edema; right upper extremity AVG with positive thrill and bruit.  Third toes bilaterally with gangrenous changes and discoloration. Skin: Refer to media section in epic for images of affected skin including panniculitis process and right lower quadrant in her abdomen and also discoloration is/gangrenous changes on her third toe bilaterally. Psychiatry: Judgement and insight appear normal. Mood & affect appropriate.     Data Reviewed: I have personally reviewed following labs and imaging studies  CBC: Recent Labs  Lab 12/29/19 1047 12/30/19 0541  WBC 20.5* 17.2*  NEUTROABS 16.5*   --   HGB 8.2* 7.1*  HCT 27.5* 23.2*  MCV 89.9 88.2  PLT 825* 626*    Basic Metabolic Panel: Recent Labs  Lab 12/29/19 1047 12/30/19 0541  NA 138 134*  K 3.9 4.0  CL 96* 96*  CO2 24 26  GLUCOSE 61* 88  BUN 33* 18  CREATININE 9.53* 6.13*  CALCIUM 9.4 8.3*  MG 1.8  --   PHOS  --  2.5    GFR: Estimated Creatinine Clearance: 11.4 mL/min (A) (by C-G formula based on SCr of 6.13 mg/dL (H)).  Liver Function Tests: Recent Labs  Lab 12/29/19 1047 12/30/19 0541  AST 7*  --   ALT 8  --   ALKPHOS 51  --   BILITOT 0.4  --   PROT 9.0*  --   ALBUMIN 2.9* 2.3*    CBG: Recent Labs  Lab 12/29/19 2146 12/30/19 0757 12/30/19 1158 12/30/19 1630 12/30/19 1636  GLUCAP 77 92 114* 110* 110*     Recent Results (from the past 240 hour(s))  SARS Coronavirus 2 by RT PCR (hospital order, performed in [facility] hospital lab) Nasopharyngeal Nasopharyngeal Swab     Status: None   Collection Time: 12/29/19 10:17 AM   Specimen: Nasopharyngeal Swab  Result Value Ref Range Status   SARS Coronavirus 2 NEGATIVE NEGATIVE Final    Comment: (NOTE) SARS-CoV-2 target nucleic acids are NOT DETECTED.  The SARS-CoV-2 RNA is generally detectable in upper and lower respiratory  specimens during the acute phase of infection. The lowest concentration of SARS-CoV-2 viral copies this assay can detect is 250 copies / mL. A negative result does not preclude SARS-CoV-2 infection and should not be used as the sole basis for treatment or other patient management decisions.  A negative result may occur with improper specimen collection / handling, submission of specimen other than nasopharyngeal swab, presence of viral mutation(s) within the areas targeted by this assay, and inadequate number of viral copies (<250 copies / mL). A negative result must be combined with clinical observations, patient history, and epidemiological information.  Fact Sheet for Patients:     StrictlyIdeas.no  Fact Sheet for Healthcare Providers: BankingDealers.co.za  This test is not yet approved or  cleared by the [place] FDA and has been authorized for detection and/or diagnosis of SARS-CoV-2 by FDA under an Emergency Use Authorization (EUA).  This EUA will remain in effect (meaning this test can be used) for the duration of the COVID-19 declaration under Section 564(b)(1) of the Act, 21 U.S.C. section 360bbb-3(b)(1), unless the authorization is terminated or revoked sooner.  Performed at [facility], [address] [place], [place] [address]   Culture, blood (routine x 2)     Status: None (Preliminary result)   Collection Time: 12/29/19 10:18 AM   Specimen: BLOOD  Result Value Ref Range Status   Specimen Description BLOOD LEFT ANTECUBITAL  Final   Special Requests   Final    BOTTLES DRAWN AEROBIC AND ANAEROBIC Blood Culture adequate volume   Culture   Final    NO GROWTH < 24 HOURS Performed at [facility], [address], [place], [place] [address]    Report Status PENDING  Incomplete  Culture, blood (routine x 2)     Status: None (Preliminary result)   Collection Time: 12/29/19 10:43 AM   Specimen: BLOOD  Result Value Ref Range Status   Specimen Description BLOOD LEFT ANTECUBITAL  Final   Special Requests   Final    BOTTLES DRAWN AEROBIC AND ANAEROBIC Blood Culture results may not be optimal due to an inadequate volume of blood received in culture bottles   Culture   Final    NO GROWTH < 24 HOURS Performed at [facility], [address], [place], [place] [address]    Report Status PENDING  Incomplete      Radiology Studies: CT ABDOMEN PELVIS WO CONTRAST  Result Date: 12/29/2019 CLINICAL DATA:  Acute right lower quadrant abdominal pain. EXAM: CT ABDOMEN AND PELVIS WITHOUT CONTRAST TECHNIQUE: Multidetector CT imaging of the abdomen and pelvis was performed following the standard protocol without IV  contrast. COMPARISON:  None. FINDINGS: Lower chest: No acute abnormality. Hepatobiliary: No focal liver abnormality is seen. No gallstones, gallbladder wall thickening, or biliary dilatation. Pancreas: Unremarkable. No pancreatic ductal dilatation or surrounding inflammatory changes. Spleen: Normal in size without focal abnormality. Adrenals/Urinary Tract: Adrenal glands are unremarkable. 2.3 cm partially exophytic rounded abnormality is seen arising posteriorly from the lower pole of left kidney. It appears to be solid in density and is concerning for possible neoplasm or malignancy. Right kidney is unremarkable. No hydronephrosis or renal obstruction is noted. No renal or ureteral calculi are noted. Urinary bladder is unremarkable. Stomach/Bowel: Stomach is within normal limits. Appendix appears normal. No evidence of bowel wall thickening, distention, or inflammatory changes. Vascular/Lymphatic: Aortic atherosclerosis. No enlarged abdominal or pelvic lymph nodes. Reproductive: Uterus and bilateral adnexa are unremarkable. Other: No abdominal wall hernia or abnormality. No abdominopelvic ascites. Musculoskeletal: No  acute or significant osseous findings. IMPRESSION: 1. 2.3 cm partially exophytic rounded abnormality is seen arising posteriorly from the lower pole of left kidney. It appears to be solid in density and is concerning for possible neoplasm or malignancy. Further evaluation with MRI with and without gadolinium administration is recommended. 2. Aortic atherosclerosis. 3. No other abnormality seen in the abdomen or pelvis. Aortic Atherosclerosis (ICD10-I70.0). Electronically Signed   By: Marijo Conception M.D.   On: 12/29/2019 11:16    Scheduled Meds: . atorvastatin  40 mg Oral QHS  . Chlorhexidine Gluconate Cloth  6 each Topical Q0600  . clopidogrel  75 mg Oral Daily  . darbepoetin (ARANESP) injection - DIALYSIS  100 mcg Intravenous Q Wed-HD  . heparin injection (subcutaneous)  5,000 Units  Subcutaneous Q8H  . insulin aspart  0-15 Units Subcutaneous TID WC  . insulin aspart  0-5 Units Subcutaneous QHS  . metoprolol succinate  100 mg Oral QHS  . multivitamin  1 tablet Oral QHS  . sevelamer carbonate  2,400 mg Oral TID WC  . sodium chloride flush  3 mL Intravenous Q12H   Continuous Infusions: . sodium chloride    . sodium chloride    . sodium chloride    . cefTAZidime (FORTAZ)  IV 2 g (12/30/19 0215)  . vancomycin 1,000 mg (12/30/19 0123)     LOS: 1 day    Time spent: 35 minutes    Barton Dubois, MD [facility]   To contact the attending provider between 7A-7P or the covering provider during after hours 7P-7A, please log into the web site [email or web address] and access using universal [place] password for that web site. If you do not have the password, please call the hospital operator.  12/30/2019, 6:01 PM

## 2019-12-30 NOTE — Progress Notes (Signed)
Wound care performed per order to pannis and bilateral toes.  Ambulated with walker and standby to bathroom and voided and had bm.  After back in bed prevalon boots appled

## 2019-12-30 NOTE — Consult Note (Signed)
[facility] Nurse Consult Note: Reason for Consult:Right lower quadrant (RLQ) area of full thickness wound with suspected cellulitis. Photo taken last evening and uploaded into EMR is appreciated.  Wound type: infectious Pressure Injury POA: No Measurement: Approximately 7cm x 16cm. 60% of wound bed is obscured by yellow fibrinous slough. Wound bed:As noted above Drainage (amount, consistency, odor): moderate amount of thin, light yellow exudate with a musty odor.   Periwound: Area of medical adhesive related skin injury (MARSI), partial thickness located superior to wound from 9-11 o'clock and measures 0.4cm x 5cm. This is already partially reepithelialized. Dressing procedure/placement/frequency: I will implement a POC consisting of a topical antimicrobial absorbent dressing (Aquacel Advantage, [place] # [id number]) that will assist with autolytic debridement of the nonviable tissue. This will be covered with an ABD pad and secured with paper tape.   Bilateral toes with necrotic areas. Nursing provided with guidance to paint with betadine swabstick and allow to air dry. Prevalon pressure redistribution heel boots are provided.  Recommend ID consultation for assistance with differential diagnosis to exclude calciphylaxis.  If you agree, please order. Alternatively, determining need for consult a few weeks post intervention can be considered.   [facility] nursing team will not follow, but will remain available to this patient, the nursing and medical teams.  Please re-consult if needed. Thanks, Maudie Flakes, MSN, RN, [place], Arther Abbott  Pager# [phone]

## 2019-12-30 NOTE — Progress Notes (Addendum)
Patient ID: GRACEE RATTERREE, female   DOB: [birth year], 64 y.o.   MRN: [id number] S: Feels better today. O:BP (!) 146/60 (BP Location: Left Wrist)   Pulse 82   Temp 98.1 F (36.7 C) (Oral)   Resp 18   Ht 5\' 4"  (1.626 m)   Wt 107 kg   LMP  (LMP Unknown)   SpO2 98%   BMI 40.49 kg/m   Intake/Output Summary (Last 24 hours) at 12/30/2019 1056 Last data filed at 12/30/2019 0500 Gross per 24 hour  Intake 699.34 ml  Output 3012 ml  Net -2312.66 ml   Intake/Output: I/O last 3 completed shifts: In: 699.3 [IV Piggyback:699.3] Out: 3012 [Other:3012]  Intake/Output this shift:  No intake/output data recorded. Weight change:  Gen: NAD CVS: no rub Resp: cta Abd: +BS, soft, area of ulceration and foul biofilm, mildly tender Ext: no edema, RUE AVG +T/B  Recent Labs  Lab 12/29/19 1047 12/30/19 0541  NA 138 134*  K 3.9 4.0  CL 96* 96*  CO2 24 26  GLUCOSE 61* 88  BUN 33* 18  CREATININE 9.53* 6.13*  ALBUMIN 2.9* 2.3*  CALCIUM 9.4 8.3*  PHOS  --  2.5  AST 7*  --   ALT 8  --    Liver Function Tests: Recent Labs  Lab 12/29/19 1047 12/30/19 0541  AST 7*  --   ALT 8  --   ALKPHOS 51  --   BILITOT 0.4  --   PROT 9.0*  --   ALBUMIN 2.9* 2.3*   No results for input(s): LIPASE, AMYLASE in the last 168 hours. No results for input(s): AMMONIA in the last 168 hours. CBC: Recent Labs  Lab 12/29/19 1047 12/30/19 0541  WBC 20.5* 17.2*  NEUTROABS 16.5*  --   HGB 8.2* 7.1*  HCT 27.5* 23.2*  MCV 89.9 88.2  PLT 825* 626*   Cardiac Enzymes: No results for input(s): CKTOTAL, CKMB, CKMBINDEX, TROPONINI in the last 168 hours. CBG: Recent Labs  Lab 12/29/19 1710 12/29/19 1737 12/29/19 1759 12/29/19 2146 12/30/19 0757  GLUCAP 43* 63* 160* 77 92    Iron Studies: No results for input(s): IRON, TIBC, TRANSFERRIN, FERRITIN in the last 72 hours. Studies/Results: CT ABDOMEN PELVIS WO CONTRAST  Result Date: 12/29/2019 CLINICAL DATA:  Acute right lower quadrant abdominal pain. EXAM:  CT ABDOMEN AND PELVIS WITHOUT CONTRAST TECHNIQUE: Multidetector CT imaging of the abdomen and pelvis was performed following the standard protocol without IV contrast. COMPARISON:  None. FINDINGS: Lower chest: No acute abnormality. Hepatobiliary: No focal liver abnormality is seen. No gallstones, gallbladder wall thickening, or biliary dilatation. Pancreas: Unremarkable. No pancreatic ductal dilatation or surrounding inflammatory changes. Spleen: Normal in size without focal abnormality. Adrenals/Urinary Tract: Adrenal glands are unremarkable. 2.3 cm partially exophytic rounded abnormality is seen arising posteriorly from the lower pole of left kidney. It appears to be solid in density and is concerning for possible neoplasm or malignancy. Right kidney is unremarkable. No hydronephrosis or renal obstruction is noted. No renal or ureteral calculi are noted. Urinary bladder is unremarkable. Stomach/Bowel: Stomach is within normal limits. Appendix appears normal. No evidence of bowel wall thickening, distention, or inflammatory changes. Vascular/Lymphatic: Aortic atherosclerosis. No enlarged abdominal or pelvic lymph nodes. Reproductive: Uterus and bilateral adnexa are unremarkable. Other: No abdominal wall hernia or abnormality. No abdominopelvic ascites. Musculoskeletal: No acute or significant osseous findings. IMPRESSION: 1. 2.3 cm partially exophytic rounded abnormality is seen arising posteriorly from the lower pole of left kidney. It appears to be  solid in density and is concerning for possible neoplasm or malignancy. Further evaluation with MRI with and without gadolinium administration is recommended. 2. Aortic atherosclerosis. 3. No other abnormality seen in the abdomen or pelvis. Aortic Atherosclerosis (ICD10-I70.0). Electronically Signed   By: Marijo Conception M.D.   On: 12/29/2019 11:16   . atorvastatin  40 mg Oral QHS  . Chlorhexidine Gluconate Cloth  6 each Topical Q0600  . clopidogrel  75 mg Oral  Daily  . darbepoetin (ARANESP) injection - DIALYSIS  100 mcg Intravenous Q Wed-HD  . heparin injection (subcutaneous)  5,000 Units Subcutaneous Q8H  . insulin aspart  0-15 Units Subcutaneous TID WC  . insulin aspart  0-5 Units Subcutaneous QHS  . metoprolol succinate  100 mg Oral QHS  . multivitamin  1 tablet Oral QHS  . sevelamer carbonate  2,400 mg Oral TID WC  . sodium chloride flush  3 mL Intravenous Q12H    BMET    Component Value Date/Time   NA 134 (L) 12/30/2019 0541   NA 139 08/17/2019 1450   K 4.0 12/30/2019 0541   CL 96 (L) 12/30/2019 0541   CO2 26 12/30/2019 0541   GLUCOSE 88 12/30/2019 0541   BUN 18 12/30/2019 0541   BUN 28 (H) 08/17/2019 1450   CREATININE 6.13 (H) 12/30/2019 0541   CALCIUM 8.3 (L) 12/30/2019 0541   GFRNONAA 7 (L) 12/30/2019 0541   GFRAA 8 (L) 12/30/2019 0541   CBC    Component Value Date/Time   WBC 17.2 (H) 12/30/2019 0541   RBC 2.63 (L) 12/30/2019 0541   HGB 7.1 (L) 12/30/2019 0541   HGB 11.8 08/17/2019 1450   HCT 23.2 (L) 12/30/2019 0541   HCT 35.6 08/17/2019 1450   PLT 626 (H) 12/30/2019 0541   PLT 424 08/17/2019 1450   MCV 88.2 12/30/2019 0541   MCV 87 08/17/2019 1450   MCH 27.0 12/30/2019 0541   MCHC 30.6 12/30/2019 0541   RDW 15.4 12/30/2019 0541   RDW 13.7 08/17/2019 1450   LYMPHSABS 2.0 12/29/2019 1047   LYMPHSABS 2.5 08/17/2019 1450   MONOABS 1.1 (H) 12/29/2019 1047   EOSABS 0.6 (H) 12/29/2019 1047   EOSABS 0.5 (H) 08/17/2019 1450   BASOSABS 0.1 12/29/2019 1047   BASOSABS 0.1 08/17/2019 1450    Dialysis Orders: [facility]. MVH846 kgHD Bath 2K/2.5CaTime 4 hoursHeparin 1800. AccessRUE AVGBFR 400DFR 600  Hectoral 3.40mcg IV/HD Epogen 1000Units IV/HD Venofer 50 mg IV q week   Assessment/Plan: 1.  Panniculitis/abdominal wall cellulitis- agree with culturing the biofilm and broad spectrum antibiotics.  Doubt this is calciphylaxis given superficial appearance of infection and no significant  pain/tenderness.  Also has low phos so doubt calciphylaxis. 2.  ESRD -  continue with HD on MWF schedule. 3.  Hypertension/volume  -  stable 4.  Anemia  - low Hgb.  Follow H/H and transfuse prn.  Will dose with aranesp today 5.  Metabolic bone disease -  Cont with home meds. But hold binders due to low phos. 6.  Nutrition - renal, carb modified diet 7. Left kidney mass- will need urology evaluation as an outpatient. 8. DM type 2- per primary Donetta Potts, MD [facility] [phone]

## 2019-12-30 NOTE — Progress Notes (Signed)
[facility] night shift.  The patient just completed dialysis and is complaining of pruritus.  She has requested Benadryl to the staff.  Benadryl 25 mg p.o. every 6 hours x2 doses ordered.  Tennis Must, MD

## 2019-12-31 DIAGNOSIS — E1152 Type 2 diabetes mellitus with diabetic peripheral angiopathy with gangrene: Secondary | ICD-10-CM

## 2019-12-31 DIAGNOSIS — L03311 Cellulitis of abdominal wall: Secondary | ICD-10-CM

## 2019-12-31 LAB — GLUCOSE, CAPILLARY
Glucose-Capillary: 84 mg/dL (ref 70–99)
Glucose-Capillary: 96 mg/dL (ref 70–99)
Glucose-Capillary: 92 mg/dL (ref 70–99)

## 2019-12-31 MED ORDER — TRULICITY 1.5 MG/0.5ML ~~LOC~~ SOAJ: 1.5000 mg | SUBCUTANEOUS | Status: DC

## 2019-12-31 MED ORDER — VANCOMYCIN IV (FOR PTA / DISCHARGE USE ONLY): 1000.0000 mg | INTRAVENOUS | 0 refills | Status: DC

## 2019-12-31 NOTE — Progress Notes (Signed)
Patient ID: SAFIYYAH VASCONEZ, female   DOB: [birth year], 64 y.o.   MRN: [id number] S: Feels well, no complaints O:BP (!) 150/65 (BP Location: Left Arm)   Pulse 78   Temp 98.5 F (36.9 C) (Oral)   Resp 18   Ht 5\' 4"  (1.626 m)   Wt 107 kg   LMP  (LMP Unknown)   SpO2 98%   BMI 40.49 kg/m  No intake or output data in the 24 hours ending 12/31/19 0904 Intake/Output: I/O last 3 completed shifts: In: 300 [IV Piggyback:300] Out: 3012 [Other:3012]  Intake/Output this shift:  No intake/output data recorded. Weight change:  Gen: NAD CVS: rrr, no rub Resp: cta Abd: obese, +BS, soft, NT/ND, wound dressing in place Ext: no edema, RUE AVG +T/B  Recent Labs  Lab 12/29/19 1047 12/30/19 0541  NA 138 134*  K 3.9 4.0  CL 96* 96*  CO2 24 26  GLUCOSE 61* 88  BUN 33* 18  CREATININE 9.53* 6.13*  ALBUMIN 2.9* 2.3*  CALCIUM 9.4 8.3*  PHOS  --  2.5  AST 7*  --   ALT 8  --    Liver Function Tests: Recent Labs  Lab 12/29/19 1047 12/30/19 0541  AST 7*  --   ALT 8  --   ALKPHOS 51  --   BILITOT 0.4  --   PROT 9.0*  --   ALBUMIN 2.9* 2.3*   No results for input(s): LIPASE, AMYLASE in the last 168 hours. No results for input(s): AMMONIA in the last 168 hours. CBC: Recent Labs  Lab 12/29/19 1047 12/30/19 0541  WBC 20.5* 17.2*  NEUTROABS 16.5*  --   HGB 8.2* 7.1*  HCT 27.5* 23.2*  MCV 89.9 88.2  PLT 825* 626*   Cardiac Enzymes: No results for input(s): CKTOTAL, CKMB, CKMBINDEX, TROPONINI in the last 168 hours. CBG: Recent Labs  Lab 12/30/19 1158 12/30/19 1630 12/30/19 1636 12/30/19 2211 12/31/19 0806  GLUCAP 114* 110* 110* 118* 84    Iron Studies: No results for input(s): IRON, TIBC, TRANSFERRIN, FERRITIN in the last 72 hours. Studies/Results: CT ABDOMEN PELVIS WO CONTRAST  Result Date: 12/29/2019 CLINICAL DATA:  Acute right lower quadrant abdominal pain. EXAM: CT ABDOMEN AND PELVIS WITHOUT CONTRAST TECHNIQUE: Multidetector CT imaging of the abdomen and pelvis was  performed following the standard protocol without IV contrast. COMPARISON:  None. FINDINGS: Lower chest: No acute abnormality. Hepatobiliary: No focal liver abnormality is seen. No gallstones, gallbladder wall thickening, or biliary dilatation. Pancreas: Unremarkable. No pancreatic ductal dilatation or surrounding inflammatory changes. Spleen: Normal in size without focal abnormality. Adrenals/Urinary Tract: Adrenal glands are unremarkable. 2.3 cm partially exophytic rounded abnormality is seen arising posteriorly from the lower pole of left kidney. It appears to be solid in density and is concerning for possible neoplasm or malignancy. Right kidney is unremarkable. No hydronephrosis or renal obstruction is noted. No renal or ureteral calculi are noted. Urinary bladder is unremarkable. Stomach/Bowel: Stomach is within normal limits. Appendix appears normal. No evidence of bowel wall thickening, distention, or inflammatory changes. Vascular/Lymphatic: Aortic atherosclerosis. No enlarged abdominal or pelvic lymph nodes. Reproductive: Uterus and bilateral adnexa are unremarkable. Other: No abdominal wall hernia or abnormality. No abdominopelvic ascites. Musculoskeletal: No acute or significant osseous findings. IMPRESSION: 1. 2.3 cm partially exophytic rounded abnormality is seen arising posteriorly from the lower pole of left kidney. It appears to be solid in density and is concerning for possible neoplasm or malignancy. Further evaluation with MRI with and without gadolinium administration is  recommended. 2. Aortic atherosclerosis. 3. No other abnormality seen in the abdomen or pelvis. Aortic Atherosclerosis (ICD10-I70.0). Electronically Signed   By: Marijo Conception M.D.   On: 12/29/2019 11:16   . atorvastatin  40 mg Oral QHS  . Chlorhexidine Gluconate Cloth  6 each Topical Q0600  . clopidogrel  75 mg Oral Daily  . darbepoetin (ARANESP) injection - DIALYSIS  100 mcg Intravenous Q Wed-HD  . heparin injection  (subcutaneous)  5,000 Units Subcutaneous Q8H  . insulin aspart  0-15 Units Subcutaneous TID WC  . insulin aspart  0-5 Units Subcutaneous QHS  . metoprolol succinate  100 mg Oral QHS  . multivitamin  1 tablet Oral QHS  . sevelamer carbonate  2,400 mg Oral TID WC  . sodium chloride flush  3 mL Intravenous Q12H    BMET    Component Value Date/Time   NA 134 (L) 12/30/2019 0541   NA 139 08/17/2019 1450   K 4.0 12/30/2019 0541   CL 96 (L) 12/30/2019 0541   CO2 26 12/30/2019 0541   GLUCOSE 88 12/30/2019 0541   BUN 18 12/30/2019 0541   BUN 28 (H) 08/17/2019 1450   CREATININE 6.13 (H) 12/30/2019 0541   CALCIUM 8.3 (L) 12/30/2019 0541   GFRNONAA 7 (L) 12/30/2019 0541   GFRAA 8 (L) 12/30/2019 0541   CBC    Component Value Date/Time   WBC 17.2 (H) 12/30/2019 0541   RBC 2.63 (L) 12/30/2019 0541   HGB 7.1 (L) 12/30/2019 0541   HGB 11.8 08/17/2019 1450   HCT 23.2 (L) 12/30/2019 0541   HCT 35.6 08/17/2019 1450   PLT 626 (H) 12/30/2019 0541   PLT 424 08/17/2019 1450   MCV 88.2 12/30/2019 0541   MCV 87 08/17/2019 1450   MCH 27.0 12/30/2019 0541   MCHC 30.6 12/30/2019 0541   RDW 15.4 12/30/2019 0541   RDW 13.7 08/17/2019 1450   LYMPHSABS 2.0 12/29/2019 1047   LYMPHSABS 2.5 08/17/2019 1450   MONOABS 1.1 (H) 12/29/2019 1047   EOSABS 0.6 (H) 12/29/2019 1047   EOSABS 0.5 (H) 08/17/2019 1450   BASOSABS 0.1 12/29/2019 1047   BASOSABS 0.1 08/17/2019 1450   Dialysis Orders: [facility] M[facility]. EDW106kgHD Bath 2K/2.5CaTime 4 hoursHeparin 1800. 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  Doubt this is calciphylaxis given superficial appearance of infection and no significant pain/tenderness.  Also has low phos which does not support calciphylaxis. 2. ESRD- continue with HD on  MWF schedule. 3. Hypertension/volume- stable 4. Anemia- low Hgb. Follow H/H and transfuse prn. Given Aranesp on 06/05/04. 5. Metabolic bone disease- Cont with home meds. But hold binders due to low phos. 6. Nutrition- renal, carb modified diet 7. Left kidney mass- will need urology evaluation as an outpatient. 8. DM type 2- per primary 9. Disposition- possible discharge to home with [facility] after dialysis today.  Will need antibiotics at HD after discharge per primary. Donetta Potts, MD [employer] [phone]

## 2019-12-31 NOTE — Progress Notes (Signed)
PHARMACY CONSULT NOTE FOR:  OUTPATIENT  PARENTERAL ANTIBIOTIC THERAPY (OPAT)  Indication: cellulitis Regimen: vancomycin 1gm iv to be given in last hour of dialysis MWF  End date: 01/10/20  IV antibiotic discharge orders are pended. To discharging provider:  please sign these orders via discharge navigator,  Select New Orders & click on the button choice - Manage This Unsigned Work.     Thank you for allowing pharmacy to be a part of this patient's care.  Donna Christen Henley Blyth 12/31/2019, 3:56 PM

## 2019-12-31 NOTE — Progress Notes (Addendum)
Pt discharged via wheelchair with daughter in stable condition after dialysis. Went over discharge paperwork, follow up appts and paperwork given to patient.

## 2019-12-31 NOTE — Care Management Important Message (Signed)
Important Message  Patient Details  Name: Christy Zhang MRN: [id number] Date of Birth: February 20, 1956   Medicare Important Message Given:  Yes     Tommy Medal 12/31/2019, 1:59 PM

## 2019-12-31 NOTE — Procedures (Signed)
     HEMODIALYSIS TREATMENT NOTE:  3.5 hour low heparin dialysis completed via RUE loop AVG (15g/retrograde flow).  Goal met: 1.4 liters removed without interruption in UF.  Vancomycin and Tressie Ellis given at end of treatment.  All blood was returned and hemostasis was achieved in 15 minutes.  No changes from pre-HD assessment.  Rockwell Alexandria, RN

## 2019-12-31 NOTE — Discharge Instructions (Signed)
Chronic Kidney Disease, Adult Chronic kidney disease (CKD) occurs when the kidneys become damaged slowly over a long period of time. The kidneys are a pair of organs that do many important jobs in the body, including:  Removing waste and extra fluid from the blood to make urine.  Making hormones that maintain the amount of fluid in tissues and blood vessels.  Maintaining the right amount of fluids and chemicals in the body. A small amount of kidney damage may not cause problems, but a large amount of damage may make it hard or impossible for the kidneys to work the way they should. If steps are not taken to slow down kidney damage or to stop it from getting worse, the kidneys may stop working permanently (end-stage renal disease or ESRD). Most of the time, CKD does not go away, but it can often be controlled. People who have CKD are usually able to live normal lives. What are the causes? The most common causes of this condition are diabetes and high blood pressure (hypertension). Other causes include:  Heart and blood vessel (cardiovascular) disease.  Kidney diseases, such as: ? Glomerulonephritis. ? Interstitial nephritis. ? Polycystic kidney disease. ? Renal vascular disease.  Diseases that affect the immune system.  Genetic diseases.  Medicines that damage the kidneys, such as anti-inflammatory medicines.  Being around or being in contact with poisonous (toxic) substances.  A kidney or urinary infection that occurs again and again (recurs).  Vasculitis. This is swelling or inflammation of the blood vessels.  A problem with urine flow that may be caused by: ? Cancer. ? Having kidney stones more than one time. ? An enlarged prostate, in males. What increases the risk? You are more likely to develop this condition if you:  Are older than age 44.  Are female.  Are African-American, Hispanic, Asian, [place], or American [place].  Are a current or former  smoker.  Are obese.  Have a family history of kidney disease or failure.  Often take medicines that are damaging to the kidneys. What are the signs or symptoms? Symptoms of this condition include:  Swelling (edema) of the face, legs, ankles, or feet.  Tiredness (lethargy) and having less energy.  Nausea or vomiting.  Confusion or trouble concentrating.  Problems with urination, such as: ? Painful or burning feeling during urination. ? Decreased urine production. ? Frequent urination, especially at night. ? Bloody urine.  Muscle twitches and cramps, especially in the legs.  Shortness of breath.  Weakness.  Loss of appetite.  Metallic taste in the mouth.  Trouble sleeping.  Dry, itchy skin.  A low blood count (anemia).  Pale lining of the eyelids and surface of the eye (conjunctiva). Symptoms develop slowly and may not be obvious until the kidney damage becomes severe. It is possible to have kidney disease for years without having any symptoms. How is this diagnosed? This condition may be diagnosed based on:  Blood tests.  Urine tests.  Imaging tests, such as an ultrasound or CT scan.  A test in which a sample of tissue is removed from the kidneys to be examined under a microscope (kidney biopsy). These test results will help your health care provider determine how serious the CKD is. How is this treated? There is no cure for most cases of this condition, but treatment usually relieves symptoms and prevents or slows the progression of the disease. Treatment may include:  Making diet changes, which may require you to avoid alcohol, salty foods (sodium),  and foods that are high in potassium, calcium, and protein.  Medicines: ? To lower blood pressure. ? To control blood glucose. ? To relieve anemia. ? To relieve swelling. ? To protect your bones. ? To improve the balance of electrolytes in your blood.  Removing toxic waste from the body through types of  dialysis, if the kidneys can no longer do their job (kidney failure).  Managing any other conditions that are causing your CKD or making it worse. Follow these instructions at home: Medicines  Take over-the-counter and prescription medicines only as told by your health care provider. The dose of some medicines that you take may need to be adjusted.  Do not take any new medicines unless approved by your health care provider. Many medicines can worsen your kidney damage.  Do not take any vitamin and mineral supplements unless approved by your health care provider. Many nutritional supplements can worsen your kidney damage. General instructions  Follow your prescribed diet as told by your health care provider.  Do not use any products that contain nicotine or tobacco, such as cigarettes and e-cigarettes. If you need help quitting, ask your health care provider.  Monitor and track your blood pressure at home. Report changes in your blood pressure as told by your health care provider.  If you are being treated for diabetes, monitor and track your blood sugar (blood glucose) levels as told by your health care provider.  Maintain a healthy weight. If you need help with this, ask your health care provider.  Start or continue an exercise plan. Exercise at least 30 minutes a day, 5 days a week.  Keep your immunizations up to date as told by your health care provider.  Keep all follow-up visits as told by your health care provider. This is important. Where to find more information  American Association of Kidney Patients: BombTimer.gl  National Kidney Foundation: www.kidney.[place]: https://mathis.com/  [facility]: www.lifeoptions.org and www.kidneyschool.org Contact a health care provider if:  Your symptoms get worse.  You develop new symptoms. Get help right away if:  You develop symptoms of ESRD, which include: ? Headaches. ? Numbness in the  hands or feet. ? Easy bruising. ? Frequent hiccups. ? Chest pain. ? Shortness of breath. ? Lack of menstruation, in women.  You have a fever.  You have decreased urine production.  You have pain or bleeding when you urinate. Summary  Chronic kidney disease (CKD) occurs when the kidneys become damaged slowly over a long period of time.  The most common causes of this condition are diabetes and high blood pressure (hypertension).  There is no cure for most cases of this condition, but treatment usually relieves symptoms and prevents or slows the progression of the disease. Treatment may include a combination of medicines and lifestyle changes. This information is not intended to replace advice given to you by your health care provider. Make sure you discuss any questions you have with your health care provider. Document Revised: 03/28/2017 Document Reviewed: 05/23/2016 Elsevier Patient Education  [place].   Diabetic Nephropathy  Diabetic nephropathy is kidney disease that is caused by diabetes (diabetes mellitus). Kidneys are organs that filter and clean blood and get rid of body waste products and extra fluid. Diabetes can cause gradual kidney damage over many years. Diabetic nephropathy that continues to get worse can lead to kidney failure. What are the causes? This condition is caused by kidney damage from diabetes that is not  well controlled with treatment. Having high blood sugar (glucose) for a long time because of diabetes can damage blood vessels in the kidneys and cause them to thicken and become scarred. Those changes prevent the kidneys from functioning normally. What increases the risk? This condition is more likely to develop in people with diabetes who:  Have had diabetes for many years.  Have high blood pressure.  Have high blood glucose levels over a long period of time.  Have a family history of kidney disease.  Have a history of tobacco use.  Have  certain genes that are passed from parent to child (inherited). What are the signs or symptoms? This condition may not cause symptoms at first. If you do have symptoms, they may include:  Swelling of your hands, feet, or ankles.  Weakness.  Poor appetite.  Nausea.  Confusion.  Tiredness (fatigue).  Trouble sleeping.  Dry, itchy skin. If nephropathy leads to kidney failure, symptoms may include:  Vomiting.  Shortness of breath.  Jerky movements that you cannot control (seizure).  Coma. How is this diagnosed? It is important to diagnose this condition before symptoms develop. You may be screened for diabetic nephropathy at a routine health care visit. Screening tests may include:  Urine tests. These may be done every year.  Urine collection over a 24-hour period to measure kidney function.  Blood tests to measure blood glucose levels and kidney function.  Regular blood pressure monitoring. If your health care provider suspects diabetic nephropathy, he or she may:  Review your medical history and symptoms.  Do a physical exam.  Do an ultrasound of your kidneys.  Perform a procedure to take a sample of kidney tissue for testing (biopsy). How is this treated? The goal of treatment is to prevent or slow down any damage to your kidneys by managing your diabetes. To do this, it is important to control:  Your blood pressure. ? Your target blood pressure may vary depending on your medical conditions, your age, and other factors. ? To help control blood pressure, you may be prescribed medicines to lower your blood pressure (ACE inhibitors) or to help your body get rid of excess fluid (diuretics).  Your A1c (hemoglobin A1c) level. Generally, the goal of treatment is to maintain an A1c level of less than 7%.  Your blood glucose level.  Your blood lipids. If you have high cholesterol, you may need to take lipid-lowering drugs, such as statins. Other treatments may  include:  Medicines, including insulin injections.  Lifestyle changes, such as losing weight, quitting smoking, or making changes to your diet. If your disease progresses to end-stage kidney failure, treatment may include:  Dialysis. This is a procedure to filter your blood with a machine.  Kidney transplant. Follow these instructions at home: Eating and drinking  Eat healthy foods, and eat healthy snacks between meals. Follow instructions from your health care provider about eating and drinking restrictions.  Limit your sodium (salt), protein, or fluid intake as directed.  If you drink alcohol: ? Limit how much you use to:  0-1 drink a day for nonpregnant women.  0-2 drinks a day for men. ? Be aware of how much alcohol is in your drink. In the [place] one drink equals one 12 oz bottle of beer (355 mL), one 5 oz glass of wine (148 mL), or one 1 oz glass of hard liquor (44 mL). Lifestyle  Maintain a healthy weight. Work with your health care provider to lose weight, if needed.  Do not use any products that contain nicotine or tobacco, such as cigarettes, e-cigarettes, and chewing tobacco. If you need help quitting, ask your health care provider.  Be physically active every day. Ask your health care provider what type of exercise is best for you.  Work with your health care provider to manage your blood pressure. General instructions      Follow your diabetes management plan as directed. ? Check your blood glucose levels as directed by your health care provider. ? Keep your blood glucose in your target range as directed by your health care provider. ? Have your A1c level checked two or more times a year, or as often as told by your health care provider.  Measure your blood pressure regularly at home, as told by your health care provider.  Take over-the-counter and prescription medicines only as told by your health care provider. These include insulin and supplements.  Keep  all follow-up visits and routine visits as told by your health care provider. This is important. Make sure you get screening tests as directed. Where to find more information American Diabetes Association: www.diabetes.org Contact a health care provider if:  You have trouble keeping your blood glucose in your goal range.  Your blood glucose level is higher than 240 mg/dL (13.3 mmol/L) for 2 days in a row.  You have swelling in your hands, ankles, or feet.  You feel weak, tired, or dizzy.  You have sudden muscle tightening (spasms).  You have nausea or vomiting.  You feel tired all the time. Get help right away if:  You are very sleepy.  You faint.  You have: ? A seizure. ? Severe, painful muscle spasms. ? Shortness of breath. ? Chest pain. Summary  Diabetic nephropathy is kidney disease that is caused by diabetes (diabetes mellitus).  Keep your blood sugar (glucose) in your target range as directed by your health care provider.  Work with your health care provider to manage your blood pressure.  Keep all follow-up visits and routine visits as told by your health care provider. This is important. Make sure you get screening tests as directed. This information is not intended to replace advice given to you by your health care provider. Make sure you discuss any questions you have with your health care provider. Document Revised: 09/28/2018 Document Reviewed: 09/28/2018 Elsevier Patient Education  [place].   Cellulitis, Adult  Cellulitis is a skin infection. The infected area is often warm, red, swollen, and sore. It occurs most often in the arms and lower legs. It is very important to get treated for this condition. What are the causes? This condition is caused by bacteria. The bacteria enter through a break in the skin, such as a cut, burn, insect bite, open sore, or crack. What increases the risk? This condition is more likely to occur in people  who:  Have a weak body defense system (immune system).  Have open cuts, burns, bites, or scrapes on the skin.  Are older than 62 years of age.  Have a blood sugar problem (diabetes).  Have a long-lasting (chronic) liver disease (cirrhosis) or kidney disease.  Are very overweight (obese).  Have a skin problem, such as: ? Itchy rash (eczema). ? Slow movement of blood in the veins (venous stasis). ? Fluid buildup below the skin (edema).  Have been treated with high-energy rays (radiation).  Use IV drugs. What are the signs or symptoms? Symptoms of this condition include:  Skin that is: ?  Red. ? Streaking. ? Spotting. ? Swollen. ? Sore or painful when you touch it. ? Warm.  A fever.  Chills.  Blisters. How is this diagnosed? This condition is diagnosed based on:  Medical history.  Physical exam.  Blood tests.  Imaging tests. How is this treated? Treatment for this condition may include:  Medicines to treat infections or allergies.  Home care, such as: ? Rest. ? Placing cold or warm cloths (compresses) on the skin.  Hospital care, if the condition is very bad. Follow these instructions at home: Medicines  Take over-the-counter and prescription medicines only as told by your doctor.  If you were prescribed an antibiotic medicine, take it as told by your doctor. Do not stop taking it even if you start to feel better. General instructions   Drink enough fluid to keep your pee (urine) pale yellow.  Do not touch or rub the infected area.  Raise (elevate) the infected area above the level of your heart while you are sitting or lying down.  Place cold or warm cloths on the area as told by your doctor.  Keep all follow-up visits as told by your doctor. This is important. Contact a doctor if:  You have a fever.  You do not start to get better after 1-2 days of treatment.  Your bone or joint under the infected area starts to hurt after the skin has  healed.  Your infection comes back. This can happen in the same area or another area.  You have a swollen bump in the area.  You have new symptoms.  You feel ill and have muscle aches and pains. Get help right away if:  Your symptoms get worse.  You feel very sleepy.  You throw up (vomit) or have watery poop (diarrhea) for a long time.  You see red streaks coming from the area.  Your red area gets larger.  Your red area turns dark in color. These symptoms may represent a serious problem that is an emergency. Do not wait to see if the symptoms will go away. Get medical help right away. Call your local emergency services (911 in the [place] Do not drive yourself to the hospital. Summary  Cellulitis is a skin infection. The area is often warm, red, swollen, and sore.  This condition is treated with medicines, rest, and cold and warm cloths.  Take all medicines only as told by your doctor.  Tell your doctor if symptoms do not start to get better after 1-2 days of treatment. This information is not intended to replace advice given to you by your health care provider. Make sure you discuss any questions you have with your health care provider. Document Revised: 09/04/2017 Document Reviewed: 09/04/2017 Elsevier Patient Education  [place].

## 2019-12-31 NOTE — Discharge Summary (Signed)
Physician Discharge Summary  Christy Zhang EHU:[id number] DOB: 1956-01-25 DOA: 12/29/2019  PCP: Claretta Fraise, MD  Admit date: 12/29/2019 Discharge date: 12/31/2019  Time spent: 35 minutes  Recommendations for Outpatient Follow-up:  1. Repeat CBC to follow hemoglobin stability 2. Please follow complete resolution of patient's right lower quadrant abdominal panniculitis/cellulitis. 3. Make sure patient has follow-up with urology as an outpatient for MRI it and further decision on treatment for left kidney mass. 4. Patient also need to resume follow-up with vascular surgery in order to further determine treatment for her third toes gangrene changes bilaterally.   Discharge Diagnoses:  Active Problems:   ESRD (end stage renal disease) (HCC)   Obesity, Class III, BMI 40-49.9 (morbid obesity) ([place])   Panniculitis   Abdominal wall cellulitis Essential hypertension Anemia of chronic kidney disease Hyperlipidemia Secondary hyperparathyroidism  Discharge Condition: Stable and improved.  Discharged home with instruction to follow-up with PCP in 10 days.  Code status: full code.  Diet recommendation: Low calorie diet, modified carbohydrate and renal diet emphasized.  Filed Weights   12/29/19 2230 12/30/19 0500 12/31/19 1455  Weight: 109.7 kg 107 kg 107.5 kg    History of present illness:  Christy Zhang a 64 y.o.femalewith medical history significant oftype 2 diabetes, end-stage renal disease, hypertension, hyperlipidemia, diastolic heart failure,secondary hypothyroidism and chronic anemia; who presented to the emergency department secondary to 2-week history of a rash on her right lower abdomen.Patient reports area has been affected pretty much right after she ended having section shave for some vascular procedure as an outpatient. Initially started with chills on itching that has steadily progressed to supportive cellulitic process with active drainage and foul  smell.Patient reports no fever, no chills, no nausea, no vomiting, no diarrhea, no abdominal pain. She had not had any sick contact and expressed no other acute complaints.  ED Course:In the ED patient was found with a WBCs of 20.5; normal lactic acid, CT abdomen demonstrated no abscess or deeper tissue infection; started on IV antibiotics and TRH consulted to facilitate further management of acute cellulitis/panniculitis process.  Hospital Course:  1-Panniculitis -CT images and physical examination no compatible with calciphylaxis; Case also discussed with nephrology service who is in agreement with findings. -Continue current IV antibiotics (vancomycin and Fortaz); planning for 10 more days at discharge given with HD. -Continue wound care.  -Significant improvement in drainage and smell.  2-type 2 diabetes with end-stage renal disease -Most recent A1c 6.4 -resume home hypoglycemic regimen  -Modified carbohydrate diet and renal diet has been encouraged. -Patient instructed to maintain adequate hydration and to check weight on daily basis.  3-ESRD -Continue dialysis as per renal service recommendations -last treatment while hospitalized on 12/31/2019 -next treatment 01/03/20  4-morbid obesity -Body mass index is 40.49 kg/m. -Low calorie diet, portion control and increase physical activity discussed with patient.  5-hypertension -Stable and well-controlled -Continue current antihypertensive agents. -Low-sodium diet emphasized.  6-anemia of chronic kidney disease -No overt bleeding appreciated -Continue to follow hemoglobin trend -IV iron and Epogen therapy as per renal service discretion.  7-hyperlipidemia -Continue statins  8-secondary hyperparathyroidism -Continue the use of Renvela  9-Leukocytosis -In the setting of acute cellulitis/panniculitis -Trending down appropriately -No fever -Continue current IV antibiotics for 10 more days, to be given in  HD.  10-Left kidney mass -Patient will need outpatient follow-up with urology service for further evaluation and management. -MRI with contrast recommended as an outpatient by radiology service.  11-toes gangrene -continue outpatient follow up with vascular  -  continue wound care -antibiotics provided for panniculitis will prevent any systemic infection from her toes.   Procedures:  See below for x-ray reports.  Consultations:  Nephrology service.  Discharge Exam: Vitals:   12/31/19 1530 12/31/19 1600  BP: 119/63 126/60  Pulse: 77 74  Resp:    Temp:    SpO2:      General: Afebrile, no chest pain, no nausea, no vomiting, no shortness of breath.  Reports abdominal wound is feeling better and with less suppuration.  She is afebrile. Cardiovascular: S1 and S2, regular rate and rhythm; no JVD appreciated (Even difficult to assess with body habitus); no pedal edema .  No rubs, no gallops, no murmurs. Respiratory: Clear to auscultation bilaterally; normal respiratory effort.  No using accessory muscles. Abdomen: Obese, soft, nontender, distended, positive bowel sounds.  Right lower quadrant panniculitis improved and only mildly tender on palpation.  Decrease suppuration appreciated and significant improvement in the smell. Extremities: Third toes bilaterally with gangrenous changes. Will need outpatient follow up with vascular surgery.  Discharge Instructions   Discharge Instructions    (HEART FAILURE PATIENTS) Call MD:  Anytime you have any of the following symptoms: 1) 3 pound weight gain in 24 hours or 5 pounds in 1 week 2) shortness of breath, with or without a dry hacking cough 3) swelling in the hands, feet or stomach 4) if you have to sleep on extra pillows at night in order to breathe.   Complete by: As directed    [facility] pharmacist to adjust dose for Vancomycin, Aminoglycosides and other anti-infective therapies as requested by physician.   Complete by: As  directed    [facility] to provide Cath Flo 477m   Complete by: As directed    Administer for PICC line occlusion and as ordered by physician for other access device issues.   Anaphylaxis Kit: Provided to treat any anaphylactic reaction to the medication being provided to the patient if First Dose or when requested by physician   Complete by: As directed    Epinephrine 176mml vial / amp: Administer 0.77m67m0.77ml59mubcutaneously once for moderate to severe anaphylaxis, nurse to call physician and pharmacy when reaction occurs and call 911 if needed for immediate care   Diphenhydramine 50mg20mIV vial: Administer 25-50mg 34mM PRN for first dose reaction, rash, itching, mild reaction, nurse to call physician and pharmacy when reaction occurs   Sodium Chloride 0.9% NS 500ml I16mdminister if needed for hypovolemic blood pressure drop or as ordered by physician after call to physician with anaphylactic reaction   Change dressing on IV access line weekly and PRN   Complete by: As directed    Discharge instructions   Complete by: As directed    Follow modified carb and renal diet Take medications as prescribed. Maintain adequate hydration. Follow up with PCP in 2 weeks.   Discharge wound care:   Complete by: As directed    Clean area with normal saline, pat gently to dry; cover the affected area with silver Hydrofiber (Aquacel advantage,) this may require mustard to remove top with ABD pad and secure with paper tape, change daily basis.  As part of wound care for patient's bilateral toes with gangrenous changes will recommend cleanse with soap and water, dry thoroughly especially in between her toes and then pain the areas of nonviable tissue_with Betadine swab stick and allowed to air dry.   Flush IV access with Sodium Chloride 0.9% and Heparin 10 units/ml or 100 units/ml  Complete by: As directed    Home infusion instructions - [facility]   Complete by: As directed     Instructions: Flush IV access with Sodium Chloride 0.9% and Heparin 10units/ml or 100units/ml   Change dressing on IV access line: Weekly and PRN   Instructions Cath Flo 41m: Administer for PICC Line occlusion and as ordered by physician for other access device   [facility] pharmacist to adjust dose for: Vancomycin, Aminoglycosides and other anti-infective therapies as requested by physician   Increase activity slowly   Complete by: As directed    Method of administration may be changed at the discretion of home infusion pharmacist based upon assessment of the patient and/or caregiver's ability to self-administer the medication ordered   Complete by: As directed      Allergies as of 12/31/2019      Reactions   Sulfa Antibiotics Swelling      Medication List    TAKE these medications   acetaminophen 325 MG tablet Commonly known as: TYLENOL Take 2 tablets (650 mg total) by mouth every 6 (six) hours as needed for mild pain (or Fever >/= 101).   amLODipine 5 MG tablet Commonly known as: NORVASC Take 1 tablet (5 mg total) by mouth at bedtime.   atorvastatin 40 MG tablet Commonly known as: LIPITOR Take 1 tablet (40 mg total) by mouth daily. What changed: when to take this   clopidogrel 75 MG tablet Commonly known as: Plavix Take 1 tablet (75 mg total) by mouth daily.   diphenhydrAMINE 25 MG tablet Commonly known as: BENADRYL Take 25 mg by mouth every 6 (six) hours as needed for itching.   metoprolol succinate 100 MG 24 hr tablet Commonly known as: TOPROL-XL TAKE ONE TABLET BY MOUTH DAILY. TAKE WITH OR IMMEDIATELY FOLLOWING A MEAL. What changed:   how much to take  how to take this  when to take this  additional instructions   multivitamin Tabs tablet Take 1 tablet by mouth at bedtime.   sevelamer carbonate 800 MG tablet Commonly known as: RENVELA Take 1,600-2,400 mg by mouth See admin instructions. Take 2400 mg with each meal and 1600 mg with each snack    Toujeo SoloStar 300 UNIT/ML Solostar Pen Generic drug: insulin glargine (1 Unit Dial) Inject 100 Units into the skin daily. What changed: when to take this   Trulicity 1.5 MVQ/2.5ZDSopn Generic drug: Dulaglutide Inject 0.5 mLs (1.5 mg total) into the skin once a week. Saturday   vancomycin  IVPB Inject 1,000 mg into the vein every Monday, Wednesday, and Friday with hemodialysis. Indication:  Cellulitis First Dose: No Last Day of Therapy:  9/13 Labs - Sunday/Monday:  CBC/D, BMP, and vancomycin trough. Labs - Thursday:  BMP and vancomycin trough Labs - Every other week:  ESR and CRP Method of administration:Elastomeric Method of administration may be changed at the discretion of the patient and/or caregiver's ability to self-administer the medication ordered.            Discharge Care Instructions  (From admission, onward)         Start     Ordered   12/31/19 0000  Discharge wound care:       Comments: Clean area with normal saline, pat gently to dry; cover the affected area with silver Hydrofiber (Aquacel advantage,) this may require mustard to remove top with ABD pad and secure with paper tape, change daily basis.  As part of wound care for patient's bilateral toes with  gangrenous changes will recommend cleanse with soap and water, dry thoroughly especially in between her toes and then pain the areas of nonviable tissue_with Betadine swab stick and allowed to air dry.   12/31/19 1625   12/31/19 0000  Change dressing on IV access line weekly and PRN  (Home infusion instructions - [facility] )        12/31/19 1625         Allergies  Allergen Reactions  . Sulfa Antibiotics Swelling    Follow-up Information    Claretta Fraise, MD. Schedule an appointment as soon as possible for a visit in 10 day(s).   Specialty: Family Medicine Contact information: [place] [address] [phone]               The results of significant diagnostics  from this hospitalization (including imaging, microbiology, ancillary and laboratory) are listed below for reference.    Significant Diagnostic Studies: CT ABDOMEN PELVIS WO CONTRAST  Result Date: 12/29/2019 CLINICAL DATA:  Acute right lower quadrant abdominal pain. EXAM: CT ABDOMEN AND PELVIS WITHOUT CONTRAST TECHNIQUE: Multidetector CT imaging of the abdomen and pelvis was performed following the standard protocol without IV contrast. COMPARISON:  None. FINDINGS: Lower chest: No acute abnormality. Hepatobiliary: No focal liver abnormality is seen. No gallstones, gallbladder wall thickening, or biliary dilatation. Pancreas: Unremarkable. No pancreatic ductal dilatation or surrounding inflammatory changes. Spleen: Normal in size without focal abnormality. Adrenals/Urinary Tract: Adrenal glands are unremarkable. 2.3 cm partially exophytic rounded abnormality is seen arising posteriorly from the lower pole of left kidney. It appears to be solid in density and is concerning for possible neoplasm or malignancy. Right kidney is unremarkable. No hydronephrosis or renal obstruction is noted. No renal or ureteral calculi are noted. Urinary bladder is unremarkable. Stomach/Bowel: Stomach is within normal limits. Appendix appears normal. No evidence of bowel wall thickening, distention, or inflammatory changes. Vascular/Lymphatic: Aortic atherosclerosis. No enlarged abdominal or pelvic lymph nodes. Reproductive: Uterus and bilateral adnexa are unremarkable. Other: No abdominal wall hernia or abnormality. No abdominopelvic ascites. Musculoskeletal: No acute or significant osseous findings. IMPRESSION: 1. 2.3 cm partially exophytic rounded abnormality is seen arising posteriorly from the lower pole of left kidney. It appears to be solid in density and is concerning for possible neoplasm or malignancy. Further evaluation with MRI with and without gadolinium administration is recommended. 2. Aortic atherosclerosis. 3. No  other abnormality seen in the abdomen or pelvis. Aortic Atherosclerosis (ICD10-I70.0). Electronically Signed   By: Marijo Conception M.D.   On: 12/29/2019 11:16    Microbiology: Recent Results (from the past 240 hour(s))  SARS Coronavirus 2 by RT PCR (hospital order, performed in [facility] hospital lab) Nasopharyngeal Nasopharyngeal Swab     Status: None   Collection Time: 12/29/19 10:17 AM   Specimen: Nasopharyngeal Swab  Result Value Ref Range Status   SARS Coronavirus 2 NEGATIVE NEGATIVE Final    Comment: (NOTE) SARS-CoV-2 target nucleic acids are NOT DETECTED.  The SARS-CoV-2 RNA is generally detectable in upper and lower respiratory specimens during the acute phase of infection. The lowest concentration of SARS-CoV-2 viral copies this assay can detect is 250 copies / mL. A negative result does not preclude SARS-CoV-2 infection and should not be used as the sole basis for treatment or other patient management decisions.  A negative result may occur with improper specimen collection / handling, submission of specimen other than nasopharyngeal swab, presence of viral mutation(s) within the areas targeted by this assay,  and inadequate number of viral copies (<250 copies / mL). A negative result must be combined with clinical observations, patient history, and epidemiological information.  Fact Sheet for Patients:   StrictlyIdeas.no  Fact Sheet for Healthcare Providers: BankingDealers.co.za  This test is not yet approved or  cleared by the [place] FDA and has been authorized for detection and/or diagnosis of SARS-CoV-2 by FDA under an Emergency Use Authorization (EUA).  This EUA will remain in effect (meaning this test can be used) for the duration of the COVID-19 declaration under Section 564(b)(1) of the Act, 21 U.S.C. section 360bbb-3(b)(1), unless the authorization is terminated or revoked sooner.  Performed at [facility], [address], [place], [place] [address]   Culture, blood (routine x 2)     Status: None (Preliminary result)   Collection Time: 12/29/19 10:18 AM   Specimen: BLOOD  Result Value Ref Range Status   Specimen Description BLOOD LEFT ANTECUBITAL  Final   Special Requests   Final    BOTTLES DRAWN AEROBIC AND ANAEROBIC Blood Culture adequate volume   Culture   Final    NO GROWTH 2 DAYS Performed at [facility], [address], [place], [place] [address]    Report Status PENDING  Incomplete  Culture, blood (routine x 2)     Status: None (Preliminary result)   Collection Time: 12/29/19 10:43 AM   Specimen: BLOOD  Result Value Ref Range Status   Specimen Description BLOOD LEFT ANTECUBITAL  Final   Special Requests   Final    BOTTLES DRAWN AEROBIC AND ANAEROBIC Blood Culture results may not be optimal due to an inadequate volume of blood received in culture bottles   Culture   Final    NO GROWTH 2 DAYS Performed at [facility], [address] [place], [place] [address]    Report Status PENDING  Incomplete     Labs: Basic Metabolic Panel: Recent Labs  Lab 12/29/19 1047 12/30/19 0541  NA 138 134*  K 3.9 4.0  CL 96* 96*  CO2 24 26  GLUCOSE 61* 88  BUN 33* 18  CREATININE 9.53* 6.13*  CALCIUM 9.4 8.3*  MG 1.8  --   PHOS  --  2.5   Liver Function Tests: Recent Labs  Lab 12/29/19 1047 12/30/19 0541  AST 7*  --   ALT 8  --   ALKPHOS 51  --   BILITOT 0.4  --   PROT 9.0*  --   ALBUMIN 2.9* 2.3*   CBC: Recent Labs  Lab 12/29/19 1047 12/30/19 0541  WBC 20.5* 17.2*  NEUTROABS 16.5*  --   HGB 8.2* 7.1*  HCT 27.5* 23.2*  MCV 89.9 88.2  PLT 825* 626*   CBG: Recent Labs  Lab 12/30/19 1630 12/30/19 1636 12/30/19 2211 12/31/19 0806 12/31/19 1152  GLUCAP 110* 110* 118* 84 96    Signed:  Barton Dubois MD.  [facility] 12/31/2019, 4:28 PM

## 2020-01-03 DIAGNOSIS — D509 Iron deficiency anemia, unspecified: Secondary | ICD-10-CM | POA: Diagnosis not present

## 2020-01-03 DIAGNOSIS — N2581 Secondary hyperparathyroidism of renal origin: Secondary | ICD-10-CM | POA: Diagnosis not present

## 2020-01-03 DIAGNOSIS — L03311 Cellulitis of abdominal wall: Secondary | ICD-10-CM | POA: Diagnosis not present

## 2020-01-03 DIAGNOSIS — N186 End stage renal disease: Secondary | ICD-10-CM | POA: Diagnosis not present

## 2020-01-03 DIAGNOSIS — D631 Anemia in chronic kidney disease: Secondary | ICD-10-CM | POA: Diagnosis not present

## 2020-01-03 DIAGNOSIS — Z992 Dependence on renal dialysis: Secondary | ICD-10-CM | POA: Diagnosis not present

## 2020-01-03 LAB — CULTURE, BLOOD (ROUTINE X 2)
Culture: NO GROWTH
Culture: NO GROWTH
Special Requests: ADEQUATE

## 2020-01-05 DIAGNOSIS — L03311 Cellulitis of abdominal wall: Secondary | ICD-10-CM | POA: Diagnosis not present

## 2020-01-05 DIAGNOSIS — D631 Anemia in chronic kidney disease: Secondary | ICD-10-CM | POA: Diagnosis not present

## 2020-01-05 DIAGNOSIS — Z992 Dependence on renal dialysis: Secondary | ICD-10-CM | POA: Diagnosis not present

## 2020-01-05 DIAGNOSIS — N186 End stage renal disease: Secondary | ICD-10-CM | POA: Diagnosis not present

## 2020-01-05 DIAGNOSIS — N2581 Secondary hyperparathyroidism of renal origin: Secondary | ICD-10-CM | POA: Diagnosis not present

## 2020-01-05 DIAGNOSIS — D509 Iron deficiency anemia, unspecified: Secondary | ICD-10-CM | POA: Diagnosis not present

## 2020-01-06 ENCOUNTER — Encounter: Payer: Self-pay | Admitting: Family Medicine

## 2020-01-06 ENCOUNTER — Other Ambulatory Visit: Payer: Self-pay

## 2020-01-06 ENCOUNTER — Ambulatory Visit (INDEPENDENT_AMBULATORY_CARE_PROVIDER_SITE_OTHER): Payer: Medicare Other | Admitting: Family Medicine

## 2020-01-06 ENCOUNTER — Telehealth: Payer: Self-pay | Admitting: Family Medicine

## 2020-01-06 VITALS — BP 132/72 | HR 78 | Temp 97.3°F

## 2020-01-06 DIAGNOSIS — E1122 Type 2 diabetes mellitus with diabetic chronic kidney disease: Secondary | ICD-10-CM

## 2020-01-06 DIAGNOSIS — Z992 Dependence on renal dialysis: Secondary | ICD-10-CM | POA: Diagnosis not present

## 2020-01-06 DIAGNOSIS — N2889 Other specified disorders of kidney and ureter: Secondary | ICD-10-CM

## 2020-01-06 DIAGNOSIS — Z794 Long term (current) use of insulin: Secondary | ICD-10-CM | POA: Diagnosis not present

## 2020-01-06 DIAGNOSIS — L98492 Non-pressure chronic ulcer of skin of other sites with fat layer exposed: Secondary | ICD-10-CM | POA: Diagnosis not present

## 2020-01-06 DIAGNOSIS — I151 Hypertension secondary to other renal disorders: Secondary | ICD-10-CM

## 2020-01-06 DIAGNOSIS — E785 Hyperlipidemia, unspecified: Secondary | ICD-10-CM

## 2020-01-06 DIAGNOSIS — E1169 Type 2 diabetes mellitus with other specified complication: Secondary | ICD-10-CM | POA: Diagnosis not present

## 2020-01-06 DIAGNOSIS — N186 End stage renal disease: Secondary | ICD-10-CM | POA: Diagnosis not present

## 2020-01-06 LAB — BAYER DCA HB A1C WAIVED: HB A1C (BAYER DCA - WAIVED): 6.2 % (ref <7.0)

## 2020-01-06 MED ORDER — SANTYL 250 UNIT/GM EX OINT: 1.0000 "application " | TOPICAL_OINTMENT | Freq: Every day | CUTANEOUS | 0 refills | Status: DC

## 2020-01-06 NOTE — Progress Notes (Signed)
Subjective:  Patient ID: Christy Zhang, female    DOB: 03-18-1956  Age: 64 y.o. MRN: [id number]  CC: Follow-up   HPI Christy Zhang presents for presents forFollow-up of diabetes. Patient checks blood sugar at home.  Patient denies symptoms such as polyuria, polydipsia, excessive hunger, nausea No significant hypoglycemic spells noted. Medications reviewed. Pt reports taking them regularly without complication/adverse reaction being reported today.    in for follow-up of elevated cholesterol. Doing well without complaints on current medication. Denies side effects of statin including myalgia and arthralgia and nausea. Currently no chest pain, shortness of breath or other cardiovascular related symptoms noted.  She has a rah taht is getting worse on the abd at the right lower quadrant   Depression screen [facility] 2/9 01/06/2020 10/12/2019 08/17/2019  Decreased Interest 0 0 0  Down, Depressed, Hopeless 0 0 0  PHQ - 2 Score 0 0 0  Altered sleeping - 0 -  Tired, decreased energy - 1 -  Change in appetite - 0 -  Feeling bad or failure about yourself  - 0 -  Trouble concentrating - 3 -  Moving slowly or fidgety/restless - 1 -  Suicidal thoughts - 0 -  PHQ-9 Score - 5 -  Difficult doing work/chores - Somewhat difficult -    History Koree has a past medical history of Anemia, Arthritis, Asthma, CHF (congestive heart failure) ([place]), Chronic kidney disease, Diabetes mellitus with complication ([place]), End stage renal disease ([place]), End stage renal disease on dialysis [facility]), Hemodialysis access, arteriovenous graft ([place]), Hyperlipidemia, Hypertension, and Neuromuscular disorder ([place]).   She has a past surgical history that includes Insertion of dialysis catheter; Tubal ligation; AV fistula placement (Right, 08/25/2014); Fistula superficialization (Right, 11/03/2014); Ligation of competing branches of arteriovenous fistula (Right, 11/03/2014); Fistulogram (Right, 01/16/2016); A/V Fistulagram  (Right, 02/04/2017); PERIPHERAL VASCULAR BALLOON ANGIOPLASTY (Right, 02/04/2017); IR THROMBECTOMY AV FISTULA/W THROMBOLYSIS/PTA INC SHUNT/IMG RIGHT (Right, 12/03/2018); IR US Guide Vasc Access Right (12/03/2018); IR Fluoro Guide CV Line Right (12/03/2018); IR US Guide Vasc Access Right (12/03/2018); Bascilic vein transposition (Right, 12/24/2018); AV fistula placement (Right, 03/18/2019); IR Removal Tun Cv Cath W/O FL (07/06/2019); IR THROMBECTOMY AV FISTULA W/THROMBOLYSIS/PTA/STENT INC SHUNT/IMG RT (Right, 10/11/2019); IR US Guide Vasc Access Right (10/14/2019); ABDOMINAL AORTOGRAM W/LOWER EXTREMITY (N/A, 11/18/2019); and PERIPHERAL VASCULAR BALLOON ANGIOPLASTY (Right, 11/18/2019).   Her family history includes Cancer in her father; Diabetes in her daughter, father, mother, and son; Heart disease in her daughter and son; Hypertension in her daughter, mother, sister, and son; Peripheral vascular disease in her son.She reports that she quit smoking about 35 years ago. Her smoking use included cigarettes. She has never used smokeless tobacco. She reports that she does not drink alcohol and does not use drugs.    ROS Review of Systems  Constitutional: Negative.   HENT: Negative.   Eyes: Negative for visual disturbance.  Respiratory: Negative for shortness of breath.   Cardiovascular: Negative for chest pain.  Gastrointestinal: Negative for abdominal pain.  Musculoskeletal: Negative for arthralgias.  Skin: Positive for rash (right lower abdomen).    Objective:  BP 132/72   Pulse 78   Temp (!) 97.3 F (36.3 C) (Temporal)   LMP  (LMP Unknown)   BP Readings from Last 3 Encounters:  01/06/20 132/72  12/31/19 (!) 144/64  11/18/19 (!) 124/57    Wt Readings from Last 3 Encounters:  12/31/19 234 lb 2.1 oz (106.2 kg)  11/18/19 243 lb (110.2 kg)  10/28/19 238 lb 9.6 oz (108.2  kg)     Physical Exam Constitutional:      General: She is not in acute distress.    Appearance: She is well-developed.  HENT:      Head: Normocephalic and atraumatic.  Eyes:     Conjunctiva/sclera: Conjunctivae normal.     Pupils: Pupils are equal, round, and reactive to light.  Neck:     Thyroid: No thyromegaly.  Cardiovascular:     Rate and Rhythm: Normal rate and regular rhythm.     Heart sounds: Normal heart sounds. No murmur heard.   Pulmonary:     Effort: Pulmonary effort is normal. No respiratory distress.     Breath sounds: Normal breath sounds. No wheezing or rales.  Abdominal:     General: Bowel sounds are normal. There is no distension.     Palpations: Abdomen is soft.     Tenderness: There is no abdominal tenderness.  Musculoskeletal:        General: Normal range of motion.     Cervical back: Normal range of motion and neck supple.  Lymphadenopathy:     Cervical: No cervical adenopathy.  Skin:    General: Skin is warm and dry.     Findings: Lesion (there is 16 cm area under the right abdominal skin fold that has heavy layer of slough over ulceration into the subcutaneous fat.) present.  Neurological:     Mental Status: She is alert and oriented to person, place, and time.  Psychiatric:        Behavior: Behavior normal.        Thought Content: Thought content normal.        Judgment: Judgment normal.    Results for orders placed or performed in visit on 01/06/20  CBC with Differential/Platelet  Result Value Ref Range   WBC 17.9 (H) 3.4 - 10.8 x10E3/uL   RBC 3.17 (L) 3.77 - 5.28 x10E6/uL   Hemoglobin 8.5 (LL) 11.1 - 15.9 g/dL   Hematocrit 27.0 (L) 34.0 - 46.6 %   MCV 85 79 - 97 fL   MCH 26.8 26.6 - 33.0 pg   MCHC 31.5 31 - 35 g/dL   RDW 14.3 11.7 - 15.4 %   Platelets 675 (H) 150 - 450 x10E3/uL   Neutrophils 75 Not Estab. %   Lymphs 12 Not Estab. %   Monocytes 7 Not Estab. %   Eos 4 Not Estab. %   Basos 1 Not Estab. %   Neutrophils Absolute 13.7 (H) 1 - 7 x10E3/uL   Lymphocytes Absolute 2.2 0 - 3 x10E3/uL   Monocytes Absolute 1.2 (H) 0 - 0 x10E3/uL   EOS (ABSOLUTE) 0.7 (H) 0.0 - 0.4  x10E3/uL   Basophils Absolute 0.1 0 - 0 x10E3/uL   Immature Granulocytes 1 Not Estab. %   Immature Grans (Abs) 0.1 0.0 - 0.1 x10E3/uL  CMP14+EGFR  Result Value Ref Range   Glucose 84 65 - 99 mg/dL   BUN 18 8 - 27 mg/dL   Creatinine, Ser 7.28 (HH) 0.57 - 1.00 mg/dL   GFR calc non Af Amer 5 (L) >59 mL/min/1.73   GFR calc Af Amer 6 (L) >59 mL/min/1.73   BUN/Creatinine Ratio 2 (L) 12 - 28   Sodium 138 134 - 144 mmol/L   Potassium 5.0 3.5 - 5.2 mmol/L   Chloride 96 96 - 106 mmol/L   CO2 25 20 - 29 mmol/L   Calcium 9.2 8.7 - 10.3 mg/dL   Total Protein 7.5 6.0 - 8.5 g/dL  Albumin 3.5 (L) 3.8 - 4.8 g/dL   Globulin, Total 4.0 1.5 - 4.5 g/dL   Albumin/Globulin Ratio 0.9 (L) 1.2 - 2.2   Bilirubin Total 0.4 0.0 - 1.2 mg/dL   Alkaline Phosphatase 63 48 - 121 IU/L   AST 10 0 - 40 IU/L   ALT 6 0 - 32 IU/L  Lipid panel  Result Value Ref Range   Cholesterol, Total 90 (L) 100 - 199 mg/dL   Triglycerides 72 0 - 149 mg/dL   HDL 44 >39 mg/dL   VLDL Cholesterol Cal 15 5 - 40 mg/dL   LDL Chol Calc (NIH) 31 0 - 99 mg/dL   Chol/HDL Ratio 2.0 0.0 - 4.4 ratio  Bayer DCA Hb A1c Waived  Result Value Ref Range   HB A1C (BAYER DCA - WAIVED) 6.2 <7.0 %      Assessment & Plan:   Christy Zhang was seen today for follow-up.  Diagnoses and all orders for this visit:  Controlled type 2 diabetes mellitus with chronic kidney disease on chronic dialysis, with long-term current use of insulin (HCC) -     CBC with Differential/Platelet -     CMP14+EGFR -     Lipid panel -     Bayer DCA Hb A1c Waived -     insulin glargine, 1 Unit Dial, (TOUJEO SOLOSTAR) 300 UNIT/ML Solostar Pen; Inject 100 Units into the skin in the morning.  Hypertension secondary to other renal disorders -     CBC with Differential/Platelet -     CMP14+EGFR -     Lipid panel -     Bayer DCA Hb A1c Waived -     metoprolol succinate (TOPROL-XL) 100 MG 24 hr tablet; TAKE ONE TABLET BY MOUTH DAILY. TAKE WITH OR IMMEDIATELY FOLLOWING A  MEAL.  Hyperlipidemia associated with type 2 diabetes mellitus ([place]) -     CBC with Differential/Platelet -     CMP14+EGFR -     Lipid panel -     Bayer DCA Hb A1c Waived  Skin ulcer of abdomen with fat layer exposed ([place])  Other orders -     collagenase (SANTYL) ointment; Apply 1 application topically daily.       I have changed Diera B. Behring's Toujeo SoloStar. I am also having her start on Santyl. Additionally, I am having her maintain her sevelamer carbonate, multivitamin, amLODipine, atorvastatin, acetaminophen, diphenhydrAMINE, clopidogrel, Trulicity, vancomycin, and metoprolol succinate. We will continue to administer sodium chloride flush, sodium chloride flush, and sodium chloride.  Allergies as of 01/06/2020      Reactions   Sulfa Antibiotics Swelling      Medication List       Accurate as of January 06, 2020 11:59 PM. If you have any questions, ask your nurse or doctor.        acetaminophen 325 MG tablet Commonly known as: TYLENOL Take 2 tablets (650 mg total) by mouth every 6 (six) hours as needed for mild pain (or Fever >/= 101).   amLODipine 5 MG tablet Commonly known as: NORVASC Take 1 tablet (5 mg total) by mouth at bedtime.   atorvastatin 40 MG tablet Commonly known as: LIPITOR Take 1 tablet (40 mg total) by mouth daily. What changed: when to take this   clopidogrel 75 MG tablet Commonly known as: Plavix Take 1 tablet (75 mg total) by mouth daily.   diphenhydrAMINE 25 MG tablet Commonly known as: BENADRYL Take 25 mg by mouth every 6 (six) hours as needed for itching.  metoprolol succinate 100 MG 24 hr tablet Commonly known as: TOPROL-XL TAKE ONE TABLET BY MOUTH DAILY. TAKE WITH OR IMMEDIATELY FOLLOWING A MEAL. What changed:   how much to take  how to take this  when to take this  additional instructions   multivitamin Tabs tablet Take 1 tablet by mouth at bedtime.   Santyl ointment Generic drug: collagenase Apply 1  application topically daily. Started by: Claretta Fraise, MD   sevelamer carbonate 800 MG tablet Commonly known as: RENVELA Take 1,600-2,400 mg by mouth See admin instructions. Take 2400 mg with each meal and 1600 mg with each snack   Toujeo SoloStar 300 UNIT/ML Solostar Pen Generic drug: insulin glargine (1 Unit Dial) Inject 100 Units into the skin in the morning.   Trulicity 1.5 CH/8.5ID Sopn Generic drug: Dulaglutide Inject 0.5 mLs (1.5 mg total) into the skin once a week. Saturday   vancomycin  IVPB Inject 1,000 mg into the vein every Monday, Wednesday, and Friday with hemodialysis. Indication:  Cellulitis First Dose: No Last Day of Therapy:  9/13 Labs - Sunday/Monday:  CBC/D, BMP, and vancomycin trough. Labs - Thursday:  BMP and vancomycin trough Labs - Every other week:  ESR and CRP Method of administration:Elastomeric Method of administration may be changed at the discretion of the patient and/or caregiver's ability to self-administer the medication ordered.        Follow-up: Return in about 10 days (around 01/16/2020).  Claretta Fraise, M.D.

## 2020-01-06 NOTE — Telephone Encounter (Signed)
It is 20 cmX 14 cm

## 2020-01-06 NOTE — Telephone Encounter (Signed)
Pharmacy needs to know how big the patients wound is for the collagenase ointment.

## 2020-01-07 DIAGNOSIS — N2581 Secondary hyperparathyroidism of renal origin: Secondary | ICD-10-CM | POA: Diagnosis not present

## 2020-01-07 DIAGNOSIS — Z992 Dependence on renal dialysis: Secondary | ICD-10-CM | POA: Diagnosis not present

## 2020-01-07 DIAGNOSIS — L03311 Cellulitis of abdominal wall: Secondary | ICD-10-CM | POA: Diagnosis not present

## 2020-01-07 DIAGNOSIS — D509 Iron deficiency anemia, unspecified: Secondary | ICD-10-CM | POA: Diagnosis not present

## 2020-01-07 DIAGNOSIS — D631 Anemia in chronic kidney disease: Secondary | ICD-10-CM | POA: Diagnosis not present

## 2020-01-07 DIAGNOSIS — N186 End stage renal disease: Secondary | ICD-10-CM | POA: Diagnosis not present

## 2020-01-07 LAB — CBC WITH DIFFERENTIAL/PLATELET
Basophils Absolute: 0.1 10*3/uL (ref 0.0–0.2)
Basos: 1 %
EOS (ABSOLUTE): 0.7 10*3/uL — ABNORMAL HIGH (ref 0.0–0.4)
Eos: 4 %
Hematocrit: 27 % — ABNORMAL LOW (ref 34.0–46.6)
Hemoglobin: 8.5 g/dL — CL (ref 11.1–15.9)
Immature Grans (Abs): 0.1 10*3/uL (ref 0.0–0.1)
Immature Granulocytes: 1 %
Lymphocytes Absolute: 2.2 10*3/uL (ref 0.7–3.1)
Lymphs: 12 %
MCH: 26.8 pg (ref 26.6–33.0)
MCHC: 31.5 g/dL (ref 31.5–35.7)
MCV: 85 fL (ref 79–97)
Monocytes Absolute: 1.2 10*3/uL — ABNORMAL HIGH (ref 0.1–0.9)
Monocytes: 7 %
Neutrophils Absolute: 13.7 10*3/uL — ABNORMAL HIGH (ref 1.4–7.0)
Neutrophils: 75 %
Platelets: 675 10*3/uL — ABNORMAL HIGH (ref 150–450)
RBC: 3.17 x10E6/uL — ABNORMAL LOW (ref 3.77–5.28)
RDW: 14.3 % (ref 11.7–15.4)
WBC: 17.9 10*3/uL — ABNORMAL HIGH (ref 3.4–10.8)

## 2020-01-07 LAB — CMP14+EGFR
ALT: 6 IU/L (ref 0–32)
AST: 10 IU/L (ref 0–40)
Albumin/Globulin Ratio: 0.9 — ABNORMAL LOW (ref 1.2–2.2)
Albumin: 3.5 g/dL — ABNORMAL LOW (ref 3.8–4.8)
Alkaline Phosphatase: 63 IU/L (ref 48–121)
BUN/Creatinine Ratio: 2 — ABNORMAL LOW (ref 12–28)
BUN: 18 mg/dL (ref 8–27)
Bilirubin Total: 0.4 mg/dL (ref 0.0–1.2)
CO2: 25 mmol/L (ref 20–29)
Calcium: 9.2 mg/dL (ref 8.7–10.3)
Chloride: 96 mmol/L (ref 96–106)
Creatinine, Ser: 7.28 mg/dL (ref 0.57–1.00)
GFR calc Af Amer: 6 mL/min/{1.73_m2} — ABNORMAL LOW (ref >59)
GFR calc non Af Amer: 5 mL/min/{1.73_m2} — ABNORMAL LOW (ref >59)
Globulin, Total: 4 g/dL (ref 1.5–4.5)
Glucose: 84 mg/dL (ref 65–99)
Potassium: 5 mmol/L (ref 3.5–5.2)
Sodium: 138 mmol/L (ref 134–144)
Total Protein: 7.5 g/dL (ref 6.0–8.5)

## 2020-01-07 LAB — LIPID PANEL
Chol/HDL Ratio: 2 ratio (ref 0.0–4.4)
Cholesterol, Total: 90 mg/dL — ABNORMAL LOW (ref 100–199)
HDL: 44 mg/dL (ref >39)
LDL Chol Calc (NIH): 31 mg/dL (ref 0–99)
Triglycerides: 72 mg/dL (ref 0–149)
VLDL Cholesterol Cal: 15 mg/dL (ref 5–40)

## 2020-01-07 NOTE — Telephone Encounter (Signed)
Christy Zhang at [facility] aware of wound measurements

## 2020-01-10 DIAGNOSIS — L03311 Cellulitis of abdominal wall: Secondary | ICD-10-CM | POA: Diagnosis not present

## 2020-01-10 DIAGNOSIS — N2581 Secondary hyperparathyroidism of renal origin: Secondary | ICD-10-CM | POA: Diagnosis not present

## 2020-01-10 DIAGNOSIS — N186 End stage renal disease: Secondary | ICD-10-CM | POA: Diagnosis not present

## 2020-01-10 DIAGNOSIS — Z992 Dependence on renal dialysis: Secondary | ICD-10-CM | POA: Diagnosis not present

## 2020-01-10 DIAGNOSIS — D509 Iron deficiency anemia, unspecified: Secondary | ICD-10-CM | POA: Diagnosis not present

## 2020-01-10 DIAGNOSIS — D631 Anemia in chronic kidney disease: Secondary | ICD-10-CM | POA: Diagnosis not present

## 2020-01-11 ENCOUNTER — Telehealth: Payer: Self-pay | Admitting: Family Medicine

## 2020-01-11 NOTE — Telephone Encounter (Signed)
Called patient, no answer, left message to return call 

## 2020-01-12 DIAGNOSIS — N2581 Secondary hyperparathyroidism of renal origin: Secondary | ICD-10-CM | POA: Diagnosis not present

## 2020-01-12 DIAGNOSIS — N186 End stage renal disease: Secondary | ICD-10-CM | POA: Diagnosis not present

## 2020-01-12 DIAGNOSIS — Z992 Dependence on renal dialysis: Secondary | ICD-10-CM | POA: Diagnosis not present

## 2020-01-12 DIAGNOSIS — D509 Iron deficiency anemia, unspecified: Secondary | ICD-10-CM | POA: Diagnosis not present

## 2020-01-12 DIAGNOSIS — L03311 Cellulitis of abdominal wall: Secondary | ICD-10-CM | POA: Diagnosis not present

## 2020-01-12 DIAGNOSIS — D631 Anemia in chronic kidney disease: Secondary | ICD-10-CM | POA: Diagnosis not present

## 2020-01-12 NOTE — Telephone Encounter (Signed)
appt made for 01/13/20 with Dr. Warrick Parisian (acute Dr.), pt aware

## 2020-01-13 ENCOUNTER — Encounter: Payer: Self-pay | Admitting: Family Medicine

## 2020-01-13 ENCOUNTER — Other Ambulatory Visit: Payer: Self-pay

## 2020-01-13 ENCOUNTER — Ambulatory Visit (INDEPENDENT_AMBULATORY_CARE_PROVIDER_SITE_OTHER): Payer: Medicare Other | Admitting: Family Medicine

## 2020-01-13 VITALS — BP 147/59 | HR 87 | Temp 98.6°F | Ht 64.0 in | Wt 235.0 lb

## 2020-01-13 DIAGNOSIS — L98492 Non-pressure chronic ulcer of skin of other sites with fat layer exposed: Secondary | ICD-10-CM | POA: Diagnosis not present

## 2020-01-13 MED ORDER — TOUJEO SOLOSTAR 300 UNIT/ML ~~LOC~~ SOPN: 100.0000 [IU] | PEN_INJECTOR | Freq: Every morning | SUBCUTANEOUS | 11 refills | Status: DC

## 2020-01-13 MED ORDER — METOPROLOL SUCCINATE ER 100 MG PO TB24: ORAL_TABLET | ORAL | 1 refills | Status: DC

## 2020-01-13 NOTE — Progress Notes (Signed)
BP (!) 147/59   Pulse 87   Temp 98.6 F (37 C)   Ht 5' 4"  (1.626 m)   Wt 235 lb (106.6 kg)   LMP  (LMP Unknown)   SpO2 94%   BMI 40.34 kg/m    Subjective:   Patient ID: Christy Zhang, female    DOB: Oct 25, 1955, 64 y.o.   MRN: [id number]  HPI: Christy Zhang is a 64 y.o. female presenting on 01/13/2020 for skin fold wound (pubic area. One week recheck)   HPI Patient is coming in for recheck of abdominal wall wound, she has a right lower wound on her skin under her pannus with skin breakdown and some early fat breakdown.  She says is not painful anymore and has been using Santyl cream on it.  She says is slightly better but she definitely struggles with keeping it dry because of how large her pannus is.  Relevant past medical, surgical, family and social history reviewed and updated as indicated. Interim medical history since our last visit reviewed. Allergies and medications reviewed and updated.  Review of Systems  Constitutional: Negative for chills and fever.  Eyes: Negative for visual disturbance.  Respiratory: Negative for chest tightness and shortness of breath.   Cardiovascular: Negative for chest pain and leg swelling.  Skin: Positive for color change, rash and wound.  Neurological: Negative for light-headedness and headaches.  Psychiatric/Behavioral: Negative for agitation and behavioral problems.  All other systems reviewed and are negative.   Per HPI unless specifically indicated above   Allergies as of 01/13/2020      Reactions   Sulfa Antibiotics Swelling      Medication List       Accurate as of January 13, 2020  2:42 PM. If you have any questions, ask your nurse or doctor.        acetaminophen 325 MG tablet Commonly known as: TYLENOL Take 2 tablets (650 mg total) by mouth every 6 (six) hours as needed for mild pain (or Fever >/= 101).   amLODipine 5 MG tablet Commonly known as: NORVASC Take 1 tablet (5 mg total) by mouth at bedtime.     atorvastatin 40 MG tablet Commonly known as: LIPITOR Take 1 tablet (40 mg total) by mouth daily. What changed: when to take this   clopidogrel 75 MG tablet Commonly known as: Plavix Take 1 tablet (75 mg total) by mouth daily.   diphenhydrAMINE 25 MG tablet Commonly known as: BENADRYL Take 25 mg by mouth every 6 (six) hours as needed for itching.   metoprolol succinate 100 MG 24 hr tablet Commonly known as: TOPROL-XL TAKE ONE TABLET BY MOUTH DAILY. TAKE WITH OR IMMEDIATELY FOLLOWING A MEAL.   multivitamin Tabs tablet Take 1 tablet by mouth at bedtime.   Santyl ointment Generic drug: collagenase Apply 1 application topically daily.   sevelamer carbonate 800 MG tablet Commonly known as: RENVELA Take 1,600-2,400 mg by mouth See admin instructions. Take 2400 mg with each meal and 1600 mg with each snack   Toujeo SoloStar 300 UNIT/ML Solostar Pen Generic drug: insulin glargine (1 Unit Dial) Inject 100 Units into the skin in the morning.   Trulicity 1.5 OI/7.8MV Sopn Generic drug: Dulaglutide Inject 0.5 mLs (1.5 mg total) into the skin once a week. Saturday   vancomycin  IVPB Inject 1,000 mg into the vein every Monday, Wednesday, and Friday with hemodialysis. Indication:  Cellulitis First Dose: No Last Day of Therapy:  9/13 Labs - Sunday/Monday:  CBC/D, BMP,  and vancomycin trough. Labs - Thursday:  BMP and vancomycin trough Labs - Every other week:  ESR and CRP Method of administration:Elastomeric Method of administration may be changed at the discretion of the patient and/or caregiver's ability to self-administer the medication ordered.            Durable Medical Equipment  (From admission, onward)         Start     Ordered   01/13/20 0000  For home use only DME Other see comment       Comments: ABD pad, for wound, 2/day for 30 days with 1 refill Diagnosis abdominal wall wound with ulceration and fat breakdown  Question:  Length of Need  Answer:  6 Months    01/13/20 1437           Objective:   BP (!) 147/59   Pulse 87   Temp 98.6 F (37 C)   Ht 5' 4"  (1.626 m)   Wt 235 lb (106.6 kg)   LMP  (LMP Unknown)   SpO2 94%   BMI 40.34 kg/m   Wt Readings from Last 3 Encounters:  01/13/20 235 lb (106.6 kg)  12/31/19 234 lb 2.1 oz (106.2 kg)  11/18/19 243 lb (110.2 kg)    Physical Exam Vitals and nursing note reviewed.  Constitutional:      General: She is not in acute distress.    Appearance: She is well-developed. She is not diaphoretic.  Eyes:     Conjunctiva/sclera: Conjunctivae normal.  Skin:    General: Skin is warm and dry.     Findings: No rash.       Neurological:     Mental Status: She is alert and oriented to person, place, and time.     Coordination: Coordination normal.  Psychiatric:        Behavior: Behavior normal.     Placed folded blue chuck pad under pannus to help with absorption, gave an order for ABD pads to be placed and changed twice daily and place the Santyl on daily.  Assessment & Plan:   Problem List Items Addressed This Visit    None    Visit Diagnoses    Skin ulcer of abdomen with fat layer exposed ([place])    -  Primary   Relevant Orders   AMB referral to wound care center   For home use only DME Other see comment       Follow up plan: Return in about 1 week (around 01/20/2020), or if symptoms worsen or fail to improve, for Wound care recheck.  Counseling provided for all of the vaccine components Orders Placed This Encounter  Procedures  . For home use only DME Other see comment  . AMB referral to wound care center    Caryl Pina, MD [facility] 01/13/2020, 2:42 PM

## 2020-01-14 DIAGNOSIS — N2581 Secondary hyperparathyroidism of renal origin: Secondary | ICD-10-CM | POA: Diagnosis not present

## 2020-01-14 DIAGNOSIS — D631 Anemia in chronic kidney disease: Secondary | ICD-10-CM | POA: Diagnosis not present

## 2020-01-14 DIAGNOSIS — L03311 Cellulitis of abdominal wall: Secondary | ICD-10-CM | POA: Diagnosis not present

## 2020-01-14 DIAGNOSIS — N186 End stage renal disease: Secondary | ICD-10-CM | POA: Diagnosis not present

## 2020-01-14 DIAGNOSIS — D509 Iron deficiency anemia, unspecified: Secondary | ICD-10-CM | POA: Diagnosis not present

## 2020-01-14 DIAGNOSIS — Z992 Dependence on renal dialysis: Secondary | ICD-10-CM | POA: Diagnosis not present

## 2020-01-17 DIAGNOSIS — D509 Iron deficiency anemia, unspecified: Secondary | ICD-10-CM | POA: Diagnosis not present

## 2020-01-17 DIAGNOSIS — N2581 Secondary hyperparathyroidism of renal origin: Secondary | ICD-10-CM | POA: Diagnosis not present

## 2020-01-17 DIAGNOSIS — L03311 Cellulitis of abdominal wall: Secondary | ICD-10-CM | POA: Diagnosis not present

## 2020-01-17 DIAGNOSIS — N186 End stage renal disease: Secondary | ICD-10-CM | POA: Diagnosis not present

## 2020-01-17 DIAGNOSIS — Z992 Dependence on renal dialysis: Secondary | ICD-10-CM | POA: Diagnosis not present

## 2020-01-17 DIAGNOSIS — D631 Anemia in chronic kidney disease: Secondary | ICD-10-CM | POA: Diagnosis not present

## 2020-01-19 DIAGNOSIS — Z992 Dependence on renal dialysis: Secondary | ICD-10-CM | POA: Diagnosis not present

## 2020-01-19 DIAGNOSIS — D631 Anemia in chronic kidney disease: Secondary | ICD-10-CM | POA: Diagnosis not present

## 2020-01-19 DIAGNOSIS — L03311 Cellulitis of abdominal wall: Secondary | ICD-10-CM | POA: Diagnosis not present

## 2020-01-19 DIAGNOSIS — N2581 Secondary hyperparathyroidism of renal origin: Secondary | ICD-10-CM | POA: Diagnosis not present

## 2020-01-19 DIAGNOSIS — N186 End stage renal disease: Secondary | ICD-10-CM | POA: Diagnosis not present

## 2020-01-19 DIAGNOSIS — D509 Iron deficiency anemia, unspecified: Secondary | ICD-10-CM | POA: Diagnosis not present

## 2020-01-20 ENCOUNTER — Ambulatory Visit (INDEPENDENT_AMBULATORY_CARE_PROVIDER_SITE_OTHER): Payer: Medicare Other | Admitting: Family Medicine

## 2020-01-20 ENCOUNTER — Encounter: Payer: Self-pay | Admitting: Family Medicine

## 2020-01-20 ENCOUNTER — Other Ambulatory Visit: Payer: Self-pay

## 2020-01-20 VITALS — BP 149/75 | HR 82 | Temp 98.0°F | Ht 64.0 in | Wt 235.0 lb

## 2020-01-20 DIAGNOSIS — L98492 Non-pressure chronic ulcer of skin of other sites with fat layer exposed: Secondary | ICD-10-CM

## 2020-01-20 MED ORDER — DOXYCYCLINE HYCLATE 100 MG PO TABS: 100.0000 mg | ORAL_TABLET | Freq: Two times a day (BID) | ORAL | 0 refills | Status: DC

## 2020-01-20 NOTE — Progress Notes (Signed)
BP (!) 149/75   Pulse 82   Temp 98 F (36.7 C)   Ht 5\' 4"  (1.626 m)   Wt 235 lb (106.6 kg)   LMP  (LMP Unknown)   SpO2 98%   BMI 40.34 kg/m    Subjective:   Patient ID: Christy Zhang, female    DOB: 28-Mar-1956, 64 y.o.   MRN: [id number]  HPI: Christy Zhang is a 64 y.o. female presenting on 01/20/2020 for Abdominal wound (one week recheck. Less sore per pt) That would need to be stronger most likely  HPI Patient is coming in for abdominal wound recheck. She has a lower abdominal wound on her abdomen.  She feels like it is doing better, they have been doing Santyl and ABD pads.  Patient denies any fevers or chills or pain.  She says the pain is feeling better.  Relevant past medical, surgical, family and social history reviewed and updated as indicated. Interim medical history since our last visit reviewed. Allergies and medications reviewed and updated.  Review of Systems  Constitutional: Negative for chills and fever.  Eyes: Negative for visual disturbance.  Respiratory: Negative for chest tightness and shortness of breath.   Cardiovascular: Negative for chest pain and leg swelling.  Musculoskeletal: Negative for back pain and gait problem.  Skin: Positive for color change and wound. Negative for rash.  Neurological: Negative for light-headedness and headaches.  Psychiatric/Behavioral: Negative for agitation and behavioral problems.  All other systems reviewed and are negative.   Per HPI unless specifically indicated above      Objective:   BP (!) 149/75   Pulse 82   Temp 98 F (36.7 C)   Ht 5\' 4"  (1.626 m)   Wt 235 lb (106.6 kg)   LMP  (LMP Unknown)   SpO2 98%   BMI 40.34 kg/m   Wt Readings from Last 3 Encounters:  01/20/20 235 lb (106.6 kg)  01/13/20 235 lb (106.6 kg)  12/31/19 234 lb 2.1 oz (106.2 kg)    Physical Exam Vitals and nursing note reviewed.  Constitutional:      General: She is not in acute distress.    Appearance: She is  well-developed. She is not diaphoretic.  Eyes:     Conjunctiva/sclera: Conjunctivae normal.  Skin:    General: Skin is warm and dry.     Findings: Erythema and wound present. No rash.          Comments: Wound itself is about 10 cm x 5 cm and 2 abscess pockets on either side.  Neurological:     Mental Status: She is alert and oriented to person, place, and time.     Coordination: Coordination normal.  Psychiatric:        Behavior: Behavior normal.     Assessment & Plan:   Problem List Items Addressed This Visit    None    Visit Diagnoses    Skin ulcer of abdomen with fat layer exposed ([place])    -  Primary    Starting this form some abscess, wound care referral already in place, will get as soon as possible.  For now continue with Santyl and ABD pads, will send doxycycline, opened up the 2 areas that felt fluctuant and they are draining now.  Follow up plan: Return in about 1 week (around 01/27/2020), or if symptoms worsen or fail to improve, for Wound care recheck unless she gets in the wound care facility.  Counseling provided for all of the  vaccine components No orders of the defined types were placed in this encounter.   Caryl Pina, MD [facility] 01/20/2020, 11:07 AM

## 2020-01-21 DIAGNOSIS — D509 Iron deficiency anemia, unspecified: Secondary | ICD-10-CM | POA: Diagnosis not present

## 2020-01-21 DIAGNOSIS — D631 Anemia in chronic kidney disease: Secondary | ICD-10-CM | POA: Diagnosis not present

## 2020-01-21 DIAGNOSIS — N2581 Secondary hyperparathyroidism of renal origin: Secondary | ICD-10-CM | POA: Diagnosis not present

## 2020-01-21 DIAGNOSIS — L03311 Cellulitis of abdominal wall: Secondary | ICD-10-CM | POA: Diagnosis not present

## 2020-01-21 DIAGNOSIS — N186 End stage renal disease: Secondary | ICD-10-CM | POA: Diagnosis not present

## 2020-01-21 DIAGNOSIS — Z992 Dependence on renal dialysis: Secondary | ICD-10-CM | POA: Diagnosis not present

## 2020-01-24 DIAGNOSIS — N186 End stage renal disease: Secondary | ICD-10-CM | POA: Diagnosis not present

## 2020-01-24 DIAGNOSIS — E119 Type 2 diabetes mellitus without complications: Secondary | ICD-10-CM | POA: Diagnosis not present

## 2020-01-24 DIAGNOSIS — Z794 Long term (current) use of insulin: Secondary | ICD-10-CM | POA: Diagnosis not present

## 2020-01-24 DIAGNOSIS — N2581 Secondary hyperparathyroidism of renal origin: Secondary | ICD-10-CM | POA: Diagnosis not present

## 2020-01-24 DIAGNOSIS — Z992 Dependence on renal dialysis: Secondary | ICD-10-CM | POA: Diagnosis not present

## 2020-01-24 DIAGNOSIS — D631 Anemia in chronic kidney disease: Secondary | ICD-10-CM | POA: Diagnosis not present

## 2020-01-24 DIAGNOSIS — L03311 Cellulitis of abdominal wall: Secondary | ICD-10-CM | POA: Diagnosis not present

## 2020-01-24 DIAGNOSIS — D509 Iron deficiency anemia, unspecified: Secondary | ICD-10-CM | POA: Diagnosis not present

## 2020-01-25 ENCOUNTER — Ambulatory Visit (INDEPENDENT_AMBULATORY_CARE_PROVIDER_SITE_OTHER): Payer: Medicare Other | Admitting: Licensed Clinical Social Worker

## 2020-01-25 DIAGNOSIS — J4530 Mild persistent asthma, uncomplicated: Secondary | ICD-10-CM | POA: Diagnosis not present

## 2020-01-25 DIAGNOSIS — N2889 Other specified disorders of kidney and ureter: Secondary | ICD-10-CM | POA: Diagnosis not present

## 2020-01-25 DIAGNOSIS — N186 End stage renal disease: Secondary | ICD-10-CM

## 2020-01-25 DIAGNOSIS — I151 Hypertension secondary to other renal disorders: Secondary | ICD-10-CM

## 2020-01-25 NOTE — Patient Instructions (Addendum)
Licensed Clinical Social Worker Visit Information  Goals we discussed today:   Client will talk with LCSW in next 30 days about managing health issues of client and completion daily activities (pt-stated)          CARE PLAN ENTRY   Current Barriers:   Diabetes management in client with chronic diagnoses of Asthma, HTN, ESRD, Anemia ,Obesity  Mobility issues (uses a walker to walk)  Clinical Social Work Clinical Goal(s):   LCSW will call client in next 30 days to discuss client management of health issues and client completion of daily activities  Interventions:  Talked with client about CCM program services  Talked with client about dialysis treatment for client (goes to dialysis 3 days weekly, goes to dialysis Monday, Wednesday and Friday)  Talked with client about skin care issues of client  Talked with client about LCSW support with CCM program  Talked with client about client completion of ADLs  Talked with client about ambulation needs of client  Talked with client about upcoming client appointments  Talked with client about challenges in standing  Talked with client about social support network (daughter is her home health aide)  Talked with client about pain issues of client  Talked with client about transport issues of client  Talked with client about her upcoming treatments at [facility] in [place], [place]  Talked with client about vision of client  Talked with client about meal provision of client  Talked with client  about relaxation techniques (listening to music, watch TV,being outdoors, enjoys going for a short car ride with her daughter)  Encouraged client to call RNCM as needed for nursing support  Patient Self Care Activities:   Attends scheduled medical appointments Eats meals independently Attends Dialysis treatments weekly as scheduled  Patient Self Care Deficits:  Diabetes management     Mobility issues  Initial goal  documentation       Follow Up Plan:LCSW to call [name] next 4 weeks to talk about clientmanagement of health issues and to talk withclient or [name] about clientcompletion of daily activities  Materials Provided: No  The patient verbalized understanding of instructions provided today and declined a print copy of patient instruction materials.   Norva Riffle.Ariza Evans MSW, LCSW Licensed Clinical Social Worker [facility] Management [phone]

## 2020-01-25 NOTE — Chronic Care Management (AMB) (Signed)
Chronic Care Management    Clinical Social Work Follow Up Note  01/25/2020 Name: Christy Zhang MRN: [id number] DOB: 1955-09-29  Christy Zhang is a 64 y.o. year old female who is a primary care patient of [name], [name], MD. The CCM team was consulted for assistance with [name] .   Review of patient status, including review of consultants reports, other relevant assessments, and collaboration with appropriate care team members and the patient's provider was performed as part of comprehensive patient evaluation and provision of chronic care management services.    SDOH (Social Determinants of Health) assessments performed: No;risk for depression ; risk for tobacco use; risk for stress; risk for physical inactivity    Chronic Care Management from 10/12/2019 in [place]  PHQ-9 Total Score 5      Outpatient Encounter Medications as of 01/25/2020  Medication Sig  . acetaminophen (TYLENOL) 325 MG tablet Take 2 tablets (650 mg total) by mouth every 6 (six) hours as needed for mild pain (or Fever >/= 101).  Marland Kitchen amLODipine (NORVASC) 5 MG tablet Take 1 tablet (5 mg total) by mouth at bedtime.  Marland Kitchen atorvastatin (LIPITOR) 40 MG tablet Take 1 tablet (40 mg total) by mouth daily. (Patient taking differently: Take 40 mg by mouth at bedtime. )  . clopidogrel (PLAVIX) 75 MG tablet Take 1 tablet (75 mg total) by mouth daily.  . collagenase (SANTYL) ointment Apply 1 application topically daily.  . diphenhydrAMINE (BENADRYL) 25 MG tablet Take 25 mg by mouth every 6 (six) hours as needed for itching.  Marland Kitchen doxycycline (VIBRA-TABS) 100 MG tablet Take 1 tablet (100 mg total) by mouth 2 (two) times daily. 1 po bid  . Dulaglutide (TRULICITY) 1.5 XN/1.7GY SOPN Inject 0.5 mLs (1.5 mg total) into the skin once a week. Saturday  . insulin glargine, 1 Unit Dial, (TOUJEO SOLOSTAR) 300 UNIT/ML Solostar Pen Inject 100 Units into the skin in the morning.  . metoprolol succinate  (TOPROL-XL) 100 MG 24 hr tablet TAKE ONE TABLET BY MOUTH DAILY. TAKE WITH OR IMMEDIATELY FOLLOWING A MEAL.  . multivitamin (RENA-VIT) TABS tablet Take 1 tablet by mouth at bedtime.   . sevelamer carbonate (RENVELA) 800 MG tablet Take 1,600-2,400 mg by mouth See admin instructions. Take 2400 mg with each meal and 1600 mg with each snack  . vancomycin IVPB Inject 1,000 mg into the vein every Monday, Wednesday, and Friday with hemodialysis. Indication:  Cellulitis First Dose: No Last Day of Therapy:  9/13 Labs - Sunday/Monday:  CBC/D, BMP, and vancomycin trough. Labs - Thursday:  BMP and vancomycin trough Labs - Every other week:  ESR and CRP Method of administration:Elastomeric Method of administration may be changed at the discretion of the patient and/or caregiver's ability to self-administer the medication ordered.   Facility-Administered Encounter Medications as of 01/25/2020  Medication  . sodium chloride flush (NS) 0.9 % injection 3 mL  . sodium chloride flush (NS) 0.9 % injection 3 mL     Goals    .  Client will talk with LCSW in next 30 days about managing health issues of client and completion daily activities (pt-stated)      CARE PLAN ENTRY   Current Barriers:  . Diabetes management in client with chronic diagnoses of Asthma, HTN, ESRD, Anemia ,Obesity . Mobility issues (uses a walker to walk)  Clinical Social Work Clinical Goal(s):  Marland Kitchen LCSW will call client in next 30 days to discuss client management of health issues and client completion  of daily activities  Interventions: . Talked with client about CCM program services . Talked with client about dialysis treatment for client (goes to dialysis 3 days weekly, goes to dialysis Monday, Wednesday and Friday) . Talked with client about skin care issues of client . Talked with client about LCSW support with CCM program . Talked with client about client completion of ADLs . Talked with client about ambulation needs of  client . Talked with client about upcoming client appointments . Talked with client about challenges in standing . Talked with client about social support network (daughter is her home health aide) . Talked with client about pain issues of client . Talked with client about transport issues of client . Talked with client about her upcoming treatments at [facility] in [place], [place] . Talked with client about vision of client . Talked with client about meal provision of client . Talked with client  about relaxation techniques (listening to music, watch TV,being outdoors, enjoys going for a short car ride with her daughter) . Encouraged client to call RNCM as needed for nursing support  Patient Self Care Activities:   Attends scheduled medical appointments Eats meals independently Attends Dialysis treatments weekly as scheduled  Patient Self Care Deficits: . Diabetes management     Mobility issues  Initial goal documentation       Follow Up Plan: LCSW to call [name] next 4 weeks to talk about clientmanagement of health issues and to talk withclient or [name] about clientcompletion of daily activities  Norva Riffle.Eladio Dentremont MSW, LCSW Licensed Clinical Social Worker [facility] Care Management [phone]

## 2020-01-26 DIAGNOSIS — D631 Anemia in chronic kidney disease: Secondary | ICD-10-CM | POA: Diagnosis not present

## 2020-01-26 DIAGNOSIS — N2581 Secondary hyperparathyroidism of renal origin: Secondary | ICD-10-CM | POA: Diagnosis not present

## 2020-01-26 DIAGNOSIS — Z992 Dependence on renal dialysis: Secondary | ICD-10-CM | POA: Diagnosis not present

## 2020-01-26 DIAGNOSIS — D509 Iron deficiency anemia, unspecified: Secondary | ICD-10-CM | POA: Diagnosis not present

## 2020-01-26 DIAGNOSIS — N186 End stage renal disease: Secondary | ICD-10-CM | POA: Diagnosis not present

## 2020-01-26 DIAGNOSIS — L03311 Cellulitis of abdominal wall: Secondary | ICD-10-CM | POA: Diagnosis not present

## 2020-01-27 ENCOUNTER — Ambulatory Visit: Payer: Medicare Other | Admitting: Family Medicine

## 2020-01-27 DIAGNOSIS — E669 Obesity, unspecified: Secondary | ICD-10-CM | POA: Diagnosis not present

## 2020-01-27 DIAGNOSIS — N186 End stage renal disease: Secondary | ICD-10-CM | POA: Insufficient documentation

## 2020-01-27 DIAGNOSIS — Z992 Dependence on renal dialysis: Secondary | ICD-10-CM | POA: Insufficient documentation

## 2020-01-27 DIAGNOSIS — S31109A Unspecified open wound of abdominal wall, unspecified quadrant without penetration into peritoneal cavity, initial encounter: Secondary | ICD-10-CM | POA: Diagnosis not present

## 2020-01-27 DIAGNOSIS — Z794 Long term (current) use of insulin: Secondary | ICD-10-CM | POA: Diagnosis not present

## 2020-01-27 DIAGNOSIS — E1140 Type 2 diabetes mellitus with diabetic neuropathy, unspecified: Secondary | ICD-10-CM | POA: Diagnosis not present

## 2020-01-28 DIAGNOSIS — N186 End stage renal disease: Secondary | ICD-10-CM | POA: Diagnosis not present

## 2020-01-28 DIAGNOSIS — Z992 Dependence on renal dialysis: Secondary | ICD-10-CM | POA: Diagnosis not present

## 2020-01-28 DIAGNOSIS — N2581 Secondary hyperparathyroidism of renal origin: Secondary | ICD-10-CM | POA: Diagnosis not present

## 2020-01-28 DIAGNOSIS — D509 Iron deficiency anemia, unspecified: Secondary | ICD-10-CM | POA: Diagnosis not present

## 2020-01-28 DIAGNOSIS — D631 Anemia in chronic kidney disease: Secondary | ICD-10-CM | POA: Diagnosis not present

## 2020-01-28 DIAGNOSIS — Z23 Encounter for immunization: Secondary | ICD-10-CM | POA: Diagnosis not present

## 2020-01-31 DIAGNOSIS — Z992 Dependence on renal dialysis: Secondary | ICD-10-CM | POA: Diagnosis not present

## 2020-01-31 DIAGNOSIS — N186 End stage renal disease: Secondary | ICD-10-CM | POA: Diagnosis not present

## 2020-01-31 DIAGNOSIS — D631 Anemia in chronic kidney disease: Secondary | ICD-10-CM | POA: Diagnosis not present

## 2020-01-31 DIAGNOSIS — N2581 Secondary hyperparathyroidism of renal origin: Secondary | ICD-10-CM | POA: Diagnosis not present

## 2020-01-31 DIAGNOSIS — Z23 Encounter for immunization: Secondary | ICD-10-CM | POA: Diagnosis not present

## 2020-01-31 DIAGNOSIS — D509 Iron deficiency anemia, unspecified: Secondary | ICD-10-CM | POA: Diagnosis not present

## 2020-02-01 ENCOUNTER — Telehealth: Payer: Self-pay

## 2020-02-01 NOTE — Telephone Encounter (Signed)
Elizabeth at [facility] called to let us know that patient will need to see a general surgeon to go and clean out the wound.  Who would you like to recommend her to see close by?  Just let them know and they will refer her.

## 2020-02-01 NOTE — Telephone Encounter (Signed)
Dr. Curlene Labrum at [facility] is fine.

## 2020-02-02 DIAGNOSIS — D631 Anemia in chronic kidney disease: Secondary | ICD-10-CM | POA: Diagnosis not present

## 2020-02-02 DIAGNOSIS — Z23 Encounter for immunization: Secondary | ICD-10-CM | POA: Diagnosis not present

## 2020-02-02 DIAGNOSIS — N2581 Secondary hyperparathyroidism of renal origin: Secondary | ICD-10-CM | POA: Diagnosis not present

## 2020-02-02 DIAGNOSIS — D509 Iron deficiency anemia, unspecified: Secondary | ICD-10-CM | POA: Diagnosis not present

## 2020-02-02 DIAGNOSIS — N186 End stage renal disease: Secondary | ICD-10-CM | POA: Diagnosis not present

## 2020-02-02 DIAGNOSIS — Z992 Dependence on renal dialysis: Secondary | ICD-10-CM | POA: Diagnosis not present

## 2020-02-02 NOTE — Telephone Encounter (Signed)
Left detailed message.   

## 2020-02-04 ENCOUNTER — Telehealth: Payer: Self-pay

## 2020-02-04 DIAGNOSIS — D509 Iron deficiency anemia, unspecified: Secondary | ICD-10-CM | POA: Diagnosis not present

## 2020-02-04 DIAGNOSIS — Z992 Dependence on renal dialysis: Secondary | ICD-10-CM | POA: Diagnosis not present

## 2020-02-04 DIAGNOSIS — N2581 Secondary hyperparathyroidism of renal origin: Secondary | ICD-10-CM | POA: Diagnosis not present

## 2020-02-04 DIAGNOSIS — Z23 Encounter for immunization: Secondary | ICD-10-CM | POA: Diagnosis not present

## 2020-02-04 DIAGNOSIS — D631 Anemia in chronic kidney disease: Secondary | ICD-10-CM | POA: Diagnosis not present

## 2020-02-04 DIAGNOSIS — N186 End stage renal disease: Secondary | ICD-10-CM | POA: Diagnosis not present

## 2020-02-07 ENCOUNTER — Other Ambulatory Visit: Payer: Self-pay | Admitting: Family Medicine

## 2020-02-07 DIAGNOSIS — N2581 Secondary hyperparathyroidism of renal origin: Secondary | ICD-10-CM | POA: Diagnosis not present

## 2020-02-07 DIAGNOSIS — E13622 Other specified diabetes mellitus with other skin ulcer: Secondary | ICD-10-CM

## 2020-02-07 DIAGNOSIS — L98499 Non-pressure chronic ulcer of skin of other sites with unspecified severity: Secondary | ICD-10-CM

## 2020-02-07 DIAGNOSIS — Z992 Dependence on renal dialysis: Secondary | ICD-10-CM | POA: Diagnosis not present

## 2020-02-07 DIAGNOSIS — D509 Iron deficiency anemia, unspecified: Secondary | ICD-10-CM | POA: Diagnosis not present

## 2020-02-07 DIAGNOSIS — N186 End stage renal disease: Secondary | ICD-10-CM | POA: Diagnosis not present

## 2020-02-07 DIAGNOSIS — D631 Anemia in chronic kidney disease: Secondary | ICD-10-CM | POA: Diagnosis not present

## 2020-02-07 DIAGNOSIS — Z23 Encounter for immunization: Secondary | ICD-10-CM | POA: Diagnosis not present

## 2020-02-07 NOTE — Telephone Encounter (Signed)
Referral to Dr. Juleen China submitted

## 2020-02-07 NOTE — Telephone Encounter (Signed)
Office aware of referral  being placed

## 2020-02-07 NOTE — Telephone Encounter (Signed)
Anne Ng states she would like [place] to refer patient to general surgery due to an abdominal wound that needs to be debrided they are worried it is too deep. Would like for patient to go to where she previous went to [facility]. Please advise

## 2020-02-07 NOTE — Telephone Encounter (Signed)
Please call annette to clarify, are they referring or are we? What is the reason? To which surgeon & where?

## 2020-02-09 DIAGNOSIS — N2581 Secondary hyperparathyroidism of renal origin: Secondary | ICD-10-CM | POA: Diagnosis not present

## 2020-02-09 DIAGNOSIS — D631 Anemia in chronic kidney disease: Secondary | ICD-10-CM | POA: Diagnosis not present

## 2020-02-09 DIAGNOSIS — N186 End stage renal disease: Secondary | ICD-10-CM | POA: Diagnosis not present

## 2020-02-09 DIAGNOSIS — Z992 Dependence on renal dialysis: Secondary | ICD-10-CM | POA: Diagnosis not present

## 2020-02-09 DIAGNOSIS — Z23 Encounter for immunization: Secondary | ICD-10-CM | POA: Diagnosis not present

## 2020-02-09 DIAGNOSIS — D509 Iron deficiency anemia, unspecified: Secondary | ICD-10-CM | POA: Diagnosis not present

## 2020-02-11 DIAGNOSIS — Z23 Encounter for immunization: Secondary | ICD-10-CM | POA: Diagnosis not present

## 2020-02-11 DIAGNOSIS — N2581 Secondary hyperparathyroidism of renal origin: Secondary | ICD-10-CM | POA: Diagnosis not present

## 2020-02-11 DIAGNOSIS — N186 End stage renal disease: Secondary | ICD-10-CM | POA: Diagnosis not present

## 2020-02-11 DIAGNOSIS — Z992 Dependence on renal dialysis: Secondary | ICD-10-CM | POA: Diagnosis not present

## 2020-02-11 DIAGNOSIS — D509 Iron deficiency anemia, unspecified: Secondary | ICD-10-CM | POA: Diagnosis not present

## 2020-02-11 DIAGNOSIS — D631 Anemia in chronic kidney disease: Secondary | ICD-10-CM | POA: Diagnosis not present

## 2020-02-14 DIAGNOSIS — N186 End stage renal disease: Secondary | ICD-10-CM | POA: Diagnosis not present

## 2020-02-14 DIAGNOSIS — N2581 Secondary hyperparathyroidism of renal origin: Secondary | ICD-10-CM | POA: Diagnosis not present

## 2020-02-14 DIAGNOSIS — Z992 Dependence on renal dialysis: Secondary | ICD-10-CM | POA: Diagnosis not present

## 2020-02-14 DIAGNOSIS — D509 Iron deficiency anemia, unspecified: Secondary | ICD-10-CM | POA: Diagnosis not present

## 2020-02-14 DIAGNOSIS — Z23 Encounter for immunization: Secondary | ICD-10-CM | POA: Diagnosis not present

## 2020-02-14 DIAGNOSIS — D631 Anemia in chronic kidney disease: Secondary | ICD-10-CM | POA: Diagnosis not present

## 2020-02-16 DIAGNOSIS — N2581 Secondary hyperparathyroidism of renal origin: Secondary | ICD-10-CM | POA: Diagnosis not present

## 2020-02-16 DIAGNOSIS — Z23 Encounter for immunization: Secondary | ICD-10-CM | POA: Diagnosis not present

## 2020-02-16 DIAGNOSIS — D509 Iron deficiency anemia, unspecified: Secondary | ICD-10-CM | POA: Diagnosis not present

## 2020-02-16 DIAGNOSIS — Z992 Dependence on renal dialysis: Secondary | ICD-10-CM | POA: Diagnosis not present

## 2020-02-16 DIAGNOSIS — N186 End stage renal disease: Secondary | ICD-10-CM | POA: Diagnosis not present

## 2020-02-16 DIAGNOSIS — D631 Anemia in chronic kidney disease: Secondary | ICD-10-CM | POA: Diagnosis not present

## 2020-02-17 ENCOUNTER — Encounter: Payer: Self-pay | Admitting: General Surgery

## 2020-02-17 ENCOUNTER — Other Ambulatory Visit: Payer: Self-pay

## 2020-02-17 ENCOUNTER — Ambulatory Visit (INDEPENDENT_AMBULATORY_CARE_PROVIDER_SITE_OTHER): Payer: Medicare Other | Admitting: General Surgery

## 2020-02-17 VITALS — BP 158/77 | HR 93 | Temp 98.2°F | Resp 18 | Ht 64.0 in | Wt 235.0 lb

## 2020-02-17 DIAGNOSIS — L03311 Cellulitis of abdominal wall: Secondary | ICD-10-CM | POA: Diagnosis not present

## 2020-02-17 NOTE — Progress Notes (Signed)
Christy Zhang; [id number]; 08-21-1955   HPI Patient is a 64 year old black female who was referred to my care by Dr. Livia Snellen and a wound care center for evaluation treatment of an abdominal wound.  She was admitted to [facility] recently with cellulitis of the abdominal wound.  She has since been treated with Santyl to the wound to help with chemical debridement.  She was referred to my care for possible debridement.  She was on an antibiotic given during dialysis, but she states that it has been stopped.  She is on Plavix. Past Medical History:  Diagnosis Date   Anemia    Arthritis    Asthma    in the past   CHF (congestive heart failure) ([place])    Chronic kidney disease    Renal failure, dialysis on M/W/F   Diabetes mellitus with complication (HCC)    Type 2   End stage renal disease ([place])    End stage renal disease on dialysis [facility])    Hemodialysis access, arteriovenous graft ([place])    Hyperlipidemia    Hypertension    Neuromuscular disorder [facility])     Past Surgical History:  Procedure Laterality Date   A/V FISTULAGRAM Right 02/04/2017   Procedure: A/V Fistulagram;  Surgeon: Waynetta Sandy, MD;  Location: [place] [facility];  Service: Cardiovascular;  Laterality: Right;   ABDOMINAL AORTOGRAM W/LOWER EXTREMITY N/A 11/18/2019   Procedure: ABDOMINAL AORTOGRAM W/LOWER EXTREMITY;  Surgeon: [name], MD;  Location: [place] [facility];  Service: Cardiovascular;  Laterality: N/A;   AV FISTULA PLACEMENT Right 08/25/2014   Procedure: RIGHT BRACHIOCEPHALIC ARTERIOVENOUS (AV) FISTULA CREATION;  Surgeon: Mal Misty, MD;  Location: [place];  Service: Vascular;  Laterality: Right;   AV FISTULA PLACEMENT Right 03/18/2019   Procedure: INSERTION OF ARTERIOVENOUS (AV) GORE-TEX GRAFT RIGHT ARM;  Surgeon: Serafina Mitchell, MD;  Location: [place];  Service: Vascular;  Laterality: Right;   [place] Right 12/24/2018   Procedure: FIRST STAGE  BASILIC VEIN TRANSPOSITION RIGHT UPPER ARM;  Surgeon: Serafina Mitchell, MD;  Location: [place];  Service: Vascular;  Laterality: Right;   FISTULA SUPERFICIALIZATION Right 11/03/2014   Procedure: RIGHT UPPER ARM FISTULA SUPERFICIALIZATION;  Surgeon: Mal Misty, MD;  Location: [place];  Service: Vascular;  Laterality: Right;   FISTULOGRAM Right 01/16/2016   Procedure: RIGHT UPPER EXTREMITY ARTERIOVENOUS FISTULOGRAM WITH BALLOON ANGIOPLASTY;  Surgeon: Vickie Epley, MD;  Location: [facility];  Service: Vascular;  Laterality: Right;   INSERTION OF DIALYSIS CATHETER     X4   IR FLUORO GUIDE CV LINE RIGHT  12/03/2018   IR REMOVAL TUN CV CATH W/O FL  07/06/2019   IR THROMBECTOMY AV FISTULA W/THROMBOLYSIS/PTA INC/SHUNT/IMG RIGHT Right 12/03/2018   IR THROMBECTOMY AV FISTULA W/THROMBOLYSIS/PTA/STENT INC/SHUNT/IMG RT Right 10/11/2019   IR US GUIDE VASC ACCESS RIGHT  12/03/2018   IR US GUIDE VASC ACCESS RIGHT  12/03/2018   IR US GUIDE VASC ACCESS RIGHT  10/14/2019   LIGATION OF COMPETING BRANCHES OF ARTERIOVENOUS FISTULA Right 11/03/2014   Procedure: LIGATION OF COMPETING BRANCHE x1 OF RIGHT UPPER ARM ARTERIOVENOUS FISTULA;  Surgeon: Mal Misty, MD;  Location: [place];  Service: Vascular;  Laterality: Right;   PERIPHERAL VASCULAR BALLOON ANGIOPLASTY Right 02/04/2017   Procedure: PERIPHERAL VASCULAR BALLOON ANGIOPLASTY;  Surgeon: Waynetta Sandy, MD;  Location: [place] [facility];  Service: Cardiovascular;  Laterality: Right;   PERIPHERAL VASCULAR BALLOON ANGIOPLASTY Right 11/18/2019   Procedure: PERIPHERAL VASCULAR  BALLOON ANGIOPLASTY;  Surgeon: [name], MD;  Location: [place] [facility];  Service: Cardiovascular;  Laterality: Right;  anterior tibial   TUBAL LIGATION      Family History  Problem Relation Age of Onset   Diabetes Mother    Hypertension Mother    Cancer Father    Diabetes Father    Hypertension Sister    Diabetes Daughter    Heart disease Daughter     Hypertension Daughter    Diabetes Son    Heart disease Son    Hypertension Son    Peripheral vascular disease Son        amputation    Current Outpatient Medications on File Prior to Visit  Medication Sig Dispense Refill   acetaminophen (TYLENOL) 325 MG tablet Take 2 tablets (650 mg total) by mouth every 6 (six) hours as needed for mild pain (or Fever >/= 101). 12 tablet 0   amLODipine (NORVASC) 5 MG tablet Take 1 tablet (5 mg total) by mouth at bedtime. 90 tablet 1   atorvastatin (LIPITOR) 40 MG tablet Take 1 tablet (40 mg total) by mouth daily. (Patient taking differently: Take 40 mg by mouth at bedtime. ) 90 tablet 3   clopidogrel (PLAVIX) 75 MG tablet Take 1 tablet (75 mg total) by mouth daily. 30 tablet 11   collagenase (SANTYL) ointment Apply 1 application topically daily. 90 g 0   diphenhydrAMINE (BENADRYL) 25 MG tablet Take 25 mg by mouth every 6 (six) hours as needed for itching.     Dulaglutide (TRULICITY) 1.5 PF/7.9KW SOPN Inject 0.5 mLs (1.5 mg total) into the skin once a week. Saturday     insulin glargine, 1 Unit Dial, (TOUJEO SOLOSTAR) 300 UNIT/ML Solostar Pen Inject 100 Units into the skin in the morning. 30 mL 11   metoprolol succinate (TOPROL-XL) 100 MG 24 hr tablet TAKE ONE TABLET BY MOUTH DAILY. TAKE WITH OR IMMEDIATELY FOLLOWING A MEAL. 90 tablet 1   multivitamin (RENA-VIT) TABS tablet Take 1 tablet by mouth at bedtime.      sevelamer carbonate (RENVELA) 800 MG tablet Take 1,600-2,400 mg by mouth See admin instructions. Take 2400 mg with each meal and 1600 mg with each snack     Current Facility-Administered Medications on File Prior to Visit  Medication Dose Route Frequency Provider Last Rate Last Admin   sodium chloride flush (NS) 0.9 % injection 3 mL  3 mL Intravenous Q12H [name], MD       sodium chloride flush (NS) 0.9 % injection 3 mL  3 mL Intravenous PRN [name], MD        Allergies  Allergen Reactions   Sulfa  Antibiotics Swelling    Social History   Substance and Sexual Activity  Alcohol Use No   Alcohol/week: 0.0 standard drinks    Social History   Tobacco Use  Smoking Status Former Smoker   Types: Cigarettes   Quit date: 07/25/1984   Years since quitting: 35.5  Smokeless Tobacco Never Used    Review of Systems  Constitutional: Negative.   HENT: Positive for sinus pain.   Eyes: Negative.   Respiratory: Negative.   Cardiovascular: Negative.   Gastrointestinal: Negative.   Genitourinary: Negative.   Musculoskeletal: Negative.   Skin: Positive for itching and rash.  Neurological: Negative.   Endo/Heme/Allergies: Bruises/bleeds easily.  Psychiatric/Behavioral: Negative.     Objective   Vitals:   02/17/20 0954  BP: (!) 158/77  Pulse: 93  Resp: 18  Temp: 98.2 F (36.8 C)  SpO2: 95%    Physical Exam Vitals reviewed.  Constitutional:      Appearance: Normal appearance. She is obese. She is not ill-appearing.  HENT:     Head: Normocephalic and atraumatic.  Cardiovascular:     Rate and Rhythm: Normal rate and regular rhythm.     Heart sounds: Normal heart sounds. No murmur heard.  No friction rub. No gallop.   Pulmonary:     Effort: Pulmonary effort is normal. No respiratory distress.     Breath sounds: Normal breath sounds. No stridor. No wheezing, rhonchi or rales.  Skin:    General: Skin is warm.     Findings: Erythema present.     Comments: Right lower quadrant abdominal wall pannus with large ovoid wound that measures greater than 8 cm in diameter.  There is some superficial necrotic soft tissue present with deeper granulation present.  Minimal purulent drainage is present.  No muscle is exposed.  I was able to sharply with scissors debriding the necrotic tissue.  I was limited due to her use of Plavix.  I did repack the wound with normal saline dampened gauze.  Patient tolerated the debridement very well and had no pain.  Neurological:     Mental Status:  She is alert and oriented to person, place, and time.     Assessment  Abdominal wall skin disruption into the adipose tissue. Plan   I told the patient that I would switch to normal saline wet-to-dry dressings to the wound daily.  We will stop Santyl.  She should follow-up with the wound care center for further management and treatment.  I told her I was available to see her again should she need any further surgical care.  Follow-up here as needed.

## 2020-02-18 DIAGNOSIS — Z23 Encounter for immunization: Secondary | ICD-10-CM | POA: Diagnosis not present

## 2020-02-18 DIAGNOSIS — N2581 Secondary hyperparathyroidism of renal origin: Secondary | ICD-10-CM | POA: Diagnosis not present

## 2020-02-18 DIAGNOSIS — Z992 Dependence on renal dialysis: Secondary | ICD-10-CM | POA: Diagnosis not present

## 2020-02-18 DIAGNOSIS — D631 Anemia in chronic kidney disease: Secondary | ICD-10-CM | POA: Diagnosis not present

## 2020-02-18 DIAGNOSIS — N186 End stage renal disease: Secondary | ICD-10-CM | POA: Diagnosis not present

## 2020-02-18 DIAGNOSIS — D509 Iron deficiency anemia, unspecified: Secondary | ICD-10-CM | POA: Diagnosis not present

## 2020-02-21 DIAGNOSIS — Z23 Encounter for immunization: Secondary | ICD-10-CM | POA: Diagnosis not present

## 2020-02-21 DIAGNOSIS — N186 End stage renal disease: Secondary | ICD-10-CM | POA: Diagnosis not present

## 2020-02-21 DIAGNOSIS — Z992 Dependence on renal dialysis: Secondary | ICD-10-CM | POA: Diagnosis not present

## 2020-02-21 DIAGNOSIS — D509 Iron deficiency anemia, unspecified: Secondary | ICD-10-CM | POA: Diagnosis not present

## 2020-02-21 DIAGNOSIS — N2581 Secondary hyperparathyroidism of renal origin: Secondary | ICD-10-CM | POA: Diagnosis not present

## 2020-02-21 DIAGNOSIS — D631 Anemia in chronic kidney disease: Secondary | ICD-10-CM | POA: Diagnosis not present

## 2020-02-22 ENCOUNTER — Encounter (HOSPITAL_COMMUNITY): Payer: Self-pay | Admitting: Emergency Medicine

## 2020-02-22 ENCOUNTER — Inpatient Hospital Stay (HOSPITAL_COMMUNITY)
Admission: EM | Admit: 2020-02-22 | Discharge: 2020-02-27 | DRG: 617 | Disposition: A | Discharge status: Home / Self Care | Payer: Medicare Other | Attending: Family Medicine | Admitting: Family Medicine

## 2020-02-22 ENCOUNTER — Other Ambulatory Visit: Payer: Self-pay

## 2020-02-22 ENCOUNTER — Telehealth: Payer: Self-pay | Admitting: Internal Medicine

## 2020-02-22 ENCOUNTER — Emergency Department (HOSPITAL_COMMUNITY): Payer: Medicare Other

## 2020-02-22 DIAGNOSIS — D72829 Elevated white blood cell count, unspecified: Secondary | ICD-10-CM | POA: Diagnosis present

## 2020-02-22 DIAGNOSIS — R6510 Systemic inflammatory response syndrome (SIRS) of non-infectious origin without acute organ dysfunction: Secondary | ICD-10-CM

## 2020-02-22 DIAGNOSIS — I70263 Atherosclerosis of native arteries of extremities with gangrene, bilateral legs: Secondary | ICD-10-CM | POA: Diagnosis present

## 2020-02-22 DIAGNOSIS — E1122 Type 2 diabetes mellitus with diabetic chronic kidney disease: Secondary | ICD-10-CM | POA: Diagnosis present

## 2020-02-22 DIAGNOSIS — E669 Obesity, unspecified: Secondary | ICD-10-CM | POA: Diagnosis not present

## 2020-02-22 DIAGNOSIS — E1152 Type 2 diabetes mellitus with diabetic peripheral angiopathy with gangrene: Secondary | ICD-10-CM | POA: Diagnosis present

## 2020-02-22 DIAGNOSIS — L97529 Non-pressure chronic ulcer of other part of left foot with unspecified severity: Secondary | ICD-10-CM | POA: Diagnosis present

## 2020-02-22 DIAGNOSIS — Z992 Dependence on renal dialysis: Secondary | ICD-10-CM

## 2020-02-22 DIAGNOSIS — I509 Heart failure, unspecified: Secondary | ICD-10-CM | POA: Diagnosis present

## 2020-02-22 DIAGNOSIS — J449 Chronic obstructive pulmonary disease, unspecified: Secondary | ICD-10-CM | POA: Diagnosis present

## 2020-02-22 DIAGNOSIS — E8881 Metabolic syndrome: Secondary | ICD-10-CM | POA: Diagnosis present

## 2020-02-22 DIAGNOSIS — Z794 Long term (current) use of insulin: Secondary | ICD-10-CM

## 2020-02-22 DIAGNOSIS — Z882 Allergy status to sulfonamides status: Secondary | ICD-10-CM

## 2020-02-22 DIAGNOSIS — M86671 Other chronic osteomyelitis, right ankle and foot: Secondary | ICD-10-CM | POA: Diagnosis not present

## 2020-02-22 DIAGNOSIS — E1129 Type 2 diabetes mellitus with other diabetic kidney complication: Secondary | ICD-10-CM | POA: Diagnosis not present

## 2020-02-22 DIAGNOSIS — I132 Hypertensive heart and chronic kidney disease with heart failure and with stage 5 chronic kidney disease, or end stage renal disease: Secondary | ICD-10-CM | POA: Diagnosis present

## 2020-02-22 DIAGNOSIS — I151 Hypertension secondary to other renal disorders: Secondary | ICD-10-CM

## 2020-02-22 DIAGNOSIS — I10 Essential (primary) hypertension: Secondary | ICD-10-CM | POA: Diagnosis not present

## 2020-02-22 DIAGNOSIS — Z7902 Long term (current) use of antithrombotics/antiplatelets: Secondary | ICD-10-CM

## 2020-02-22 DIAGNOSIS — Z79899 Other long term (current) drug therapy: Secondary | ICD-10-CM

## 2020-02-22 DIAGNOSIS — M869 Osteomyelitis, unspecified: Secondary | ICD-10-CM | POA: Diagnosis present

## 2020-02-22 DIAGNOSIS — M1990 Unspecified osteoarthritis, unspecified site: Secondary | ICD-10-CM | POA: Diagnosis present

## 2020-02-22 DIAGNOSIS — Z20822 Contact with and (suspected) exposure to covid-19: Secondary | ICD-10-CM | POA: Diagnosis present

## 2020-02-22 DIAGNOSIS — N186 End stage renal disease: Secondary | ICD-10-CM | POA: Diagnosis present

## 2020-02-22 DIAGNOSIS — M19071 Primary osteoarthritis, right ankle and foot: Secondary | ICD-10-CM | POA: Diagnosis not present

## 2020-02-22 DIAGNOSIS — N250 Renal osteodystrophy: Secondary | ICD-10-CM | POA: Diagnosis not present

## 2020-02-22 DIAGNOSIS — L089 Local infection of the skin and subcutaneous tissue, unspecified: Secondary | ICD-10-CM | POA: Diagnosis not present

## 2020-02-22 DIAGNOSIS — E118 Type 2 diabetes mellitus with unspecified complications: Secondary | ICD-10-CM

## 2020-02-22 DIAGNOSIS — E785 Hyperlipidemia, unspecified: Secondary | ICD-10-CM | POA: Diagnosis present

## 2020-02-22 DIAGNOSIS — Z87891 Personal history of nicotine dependence: Secondary | ICD-10-CM | POA: Diagnosis not present

## 2020-02-22 DIAGNOSIS — I96 Gangrene, not elsewhere classified: Secondary | ICD-10-CM | POA: Diagnosis present

## 2020-02-22 DIAGNOSIS — I2510 Atherosclerotic heart disease of native coronary artery without angina pectoris: Secondary | ICD-10-CM | POA: Diagnosis present

## 2020-02-22 DIAGNOSIS — E1140 Type 2 diabetes mellitus with diabetic neuropathy, unspecified: Secondary | ICD-10-CM | POA: Diagnosis not present

## 2020-02-22 DIAGNOSIS — Z833 Family history of diabetes mellitus: Secondary | ICD-10-CM | POA: Diagnosis not present

## 2020-02-22 DIAGNOSIS — E119 Type 2 diabetes mellitus without complications: Secondary | ICD-10-CM

## 2020-02-22 DIAGNOSIS — S98131A Complete traumatic amputation of one right lesser toe, initial encounter: Secondary | ICD-10-CM | POA: Diagnosis not present

## 2020-02-22 DIAGNOSIS — I739 Peripheral vascular disease, unspecified: Secondary | ICD-10-CM | POA: Diagnosis not present

## 2020-02-22 DIAGNOSIS — L03311 Cellulitis of abdominal wall: Secondary | ICD-10-CM | POA: Diagnosis present

## 2020-02-22 DIAGNOSIS — E1165 Type 2 diabetes mellitus with hyperglycemia: Secondary | ICD-10-CM | POA: Diagnosis present

## 2020-02-22 DIAGNOSIS — Z809 Family history of malignant neoplasm, unspecified: Secondary | ICD-10-CM | POA: Diagnosis not present

## 2020-02-22 DIAGNOSIS — M86279 Subacute osteomyelitis, unspecified ankle and foot: Secondary | ICD-10-CM

## 2020-02-22 DIAGNOSIS — R21 Rash and other nonspecific skin eruption: Secondary | ICD-10-CM | POA: Diagnosis present

## 2020-02-22 DIAGNOSIS — Z8249 Family history of ischemic heart disease and other diseases of the circulatory system: Secondary | ICD-10-CM | POA: Diagnosis not present

## 2020-02-22 DIAGNOSIS — Z89421 Acquired absence of other right toe(s): Secondary | ICD-10-CM | POA: Diagnosis not present

## 2020-02-22 DIAGNOSIS — M19072 Primary osteoarthritis, left ankle and foot: Secondary | ICD-10-CM | POA: Diagnosis not present

## 2020-02-22 DIAGNOSIS — M86672 Other chronic osteomyelitis, left ankle and foot: Secondary | ICD-10-CM | POA: Diagnosis not present

## 2020-02-22 DIAGNOSIS — D631 Anemia in chronic kidney disease: Secondary | ICD-10-CM | POA: Diagnosis present

## 2020-02-22 DIAGNOSIS — D638 Anemia in other chronic diseases classified elsewhere: Secondary | ICD-10-CM | POA: Diagnosis present

## 2020-02-22 DIAGNOSIS — I120 Hypertensive chronic kidney disease with stage 5 chronic kidney disease or end stage renal disease: Secondary | ICD-10-CM | POA: Diagnosis not present

## 2020-02-22 DIAGNOSIS — E1169 Type 2 diabetes mellitus with other specified complication: Principal | ICD-10-CM | POA: Diagnosis present

## 2020-02-22 DIAGNOSIS — S31109A Unspecified open wound of abdominal wall, unspecified quadrant without penetration into peritoneal cavity, initial encounter: Secondary | ICD-10-CM | POA: Diagnosis not present

## 2020-02-22 DIAGNOSIS — I872 Venous insufficiency (chronic) (peripheral): Secondary | ICD-10-CM | POA: Diagnosis present

## 2020-02-22 LAB — CBG MONITORING, ED: Glucose-Capillary: 99 mg/dL (ref 70–99)

## 2020-02-22 LAB — COMPREHENSIVE METABOLIC PANEL
ALT: 8 U/L (ref 0–44)
AST: 9 U/L — ABNORMAL LOW (ref 15–41)
Albumin: 2.4 g/dL — ABNORMAL LOW (ref 3.5–5.0)
Alkaline Phosphatase: 58 U/L (ref 38–126)
Anion gap: 12 (ref 5–15)
BUN: 18 mg/dL (ref 8–23)
CO2: 26 mmol/L (ref 22–32)
Calcium: 8.4 mg/dL — ABNORMAL LOW (ref 8.9–10.3)
Chloride: 99 mmol/L (ref 98–111)
Creatinine, Ser: 6.76 mg/dL — ABNORMAL HIGH (ref 0.44–1.00)
GFR, Estimated: 6 mL/min — ABNORMAL LOW (ref >60)
Glucose, Bld: 79 mg/dL (ref 70–99)
Potassium: 3.7 mmol/L (ref 3.5–5.1)
Sodium: 137 mmol/L (ref 135–145)
Total Bilirubin: 0.5 mg/dL (ref 0.3–1.2)
Total Protein: 7.2 g/dL (ref 6.5–8.1)

## 2020-02-22 LAB — CBC WITH DIFFERENTIAL/PLATELET
Abs Immature Granulocytes: 0.13 10*3/uL — ABNORMAL HIGH (ref 0.00–0.07)
Basophils Absolute: 0.1 10*3/uL (ref 0.0–0.1)
Basophils Relative: 0 %
Eosinophils Absolute: 0.6 10*3/uL — ABNORMAL HIGH (ref 0.0–0.5)
Eosinophils Relative: 4 %
HCT: 27.3 % — ABNORMAL LOW (ref 36.0–46.0)
Hemoglobin: 8.2 g/dL — ABNORMAL LOW (ref 12.0–15.0)
Immature Granulocytes: 1 %
Lymphocytes Relative: 15 %
Lymphs Abs: 2.5 10*3/uL (ref 0.7–4.0)
MCH: 25.5 pg — ABNORMAL LOW (ref 26.0–34.0)
MCHC: 30 g/dL (ref 30.0–36.0)
MCV: 85 fL (ref 80.0–100.0)
Monocytes Absolute: 1.1 10*3/uL — ABNORMAL HIGH (ref 0.1–1.0)
Monocytes Relative: 6 %
Neutro Abs: 12.8 10*3/uL — ABNORMAL HIGH (ref 1.7–7.7)
Neutrophils Relative %: 74 %
Platelets: 640 10*3/uL — ABNORMAL HIGH (ref 150–400)
RBC: 3.21 MIL/uL — ABNORMAL LOW (ref 3.87–5.11)
RDW: 15.9 % — ABNORMAL HIGH (ref 11.5–15.5)
WBC: 17.2 10*3/uL — ABNORMAL HIGH (ref 4.0–10.5)
nRBC: 0 % (ref 0.0–0.2)

## 2020-02-22 LAB — LACTIC ACID, PLASMA
Lactic Acid, Venous: 1.3 mmol/L (ref 0.5–1.9)
Lactic Acid, Venous: 1.3 mmol/L (ref 0.5–1.9)

## 2020-02-22 LAB — GLUCOSE, CAPILLARY: Glucose-Capillary: 98 mg/dL (ref 70–99)

## 2020-02-22 LAB — RESPIRATORY PANEL BY RT PCR (FLU A&B, COVID)
Influenza A by PCR: NEGATIVE
Influenza B by PCR: NEGATIVE
SARS Coronavirus 2 by RT PCR: NEGATIVE

## 2020-02-22 MED ORDER — PIPERACILLIN-TAZOBACTAM IN DEX 2-0.25 GM/50ML IV SOLN
2.2500 g | Freq: Three times a day (TID) | INTRAVENOUS | Status: DC
  Filled 2020-02-22 (×2): qty 50

## 2020-02-22 MED ORDER — ATORVASTATIN CALCIUM 40 MG PO TABS
40.0000 mg | ORAL_TABLET | Freq: Every day | ORAL | Status: DC
  Administered 2020-02-22 – 2020-02-26 (×5): 40 mg via ORAL
  Filled 2020-02-22 (×5): qty 1

## 2020-02-22 MED ORDER — SODIUM CHLORIDE 0.9% FLUSH
3.0000 mL | Freq: Two times a day (BID) | INTRAVENOUS | Status: DC
  Administered 2020-02-22 – 2020-02-26 (×7): 3 mL via INTRAVENOUS

## 2020-02-22 MED ORDER — ACETAMINOPHEN 325 MG PO TABS
650.0000 mg | ORAL_TABLET | Freq: Four times a day (QID) | ORAL | Status: DC | PRN
  Administered 2020-02-22 – 2020-02-25 (×3): 650 mg via ORAL
  Filled 2020-02-22 (×2): qty 2

## 2020-02-22 MED ORDER — SEVELAMER CARBONATE 800 MG PO TABS: 1600.0000 mg | ORAL_TABLET | ORAL | Status: DC

## 2020-02-22 MED ORDER — SEVELAMER CARBONATE 800 MG PO TABS
1600.0000 mg | ORAL_TABLET | ORAL | Status: DC
  Administered 2020-02-23 – 2020-02-26 (×2): 1600 mg via ORAL

## 2020-02-22 MED ORDER — CHLORHEXIDINE GLUCONATE CLOTH 2 % EX PADS
6.0000 | MEDICATED_PAD | Freq: Every day | CUTANEOUS | Status: DC
  Administered 2020-02-23 – 2020-02-27 (×5): 6 via TOPICAL

## 2020-02-22 MED ORDER — HYDROMORPHONE HCL 1 MG/ML IJ SOLN
0.5000 mg | INTRAMUSCULAR | Status: DC | PRN
  Administered 2020-02-24 – 2020-02-27 (×8): 1 mg via INTRAVENOUS
  Filled 2020-02-22 (×8): qty 1

## 2020-02-22 MED ORDER — AMLODIPINE BESYLATE 5 MG PO TABS
5.0000 mg | ORAL_TABLET | Freq: Every day | ORAL | Status: DC
  Filled 2020-02-22: qty 1

## 2020-02-22 MED ORDER — INSULIN ASPART 100 UNIT/ML ~~LOC~~ SOLN: 0.0000 [IU] | Freq: Every day | SUBCUTANEOUS | Status: DC

## 2020-02-22 MED ORDER — SEVELAMER CARBONATE 800 MG PO TABS
2400.0000 mg | ORAL_TABLET | Freq: Three times a day (TID) | ORAL | Status: DC
  Administered 2020-02-23 – 2020-02-27 (×8): 2400 mg via ORAL
  Filled 2020-02-22 (×12): qty 3

## 2020-02-22 MED ORDER — ACETAMINOPHEN 650 MG RE SUPP: 650.0000 mg | Freq: Four times a day (QID) | RECTAL | Status: DC | PRN

## 2020-02-22 MED ORDER — VANCOMYCIN HCL IN DEXTROSE 1-5 GM/200ML-% IV SOLN
1000.0000 mg | INTRAVENOUS | Status: DC
  Filled 2020-02-22: qty 200

## 2020-02-22 MED ORDER — INSULIN GLARGINE 100 UNIT/ML ~~LOC~~ SOLN
50.0000 [IU] | Freq: Every day | SUBCUTANEOUS | Status: DC
  Filled 2020-02-22: qty 0.5

## 2020-02-22 MED ORDER — PIPERACILLIN-TAZOBACTAM IN DEX 2-0.25 GM/50ML IV SOLN
2.2500 g | INTRAVENOUS | Status: AC
  Administered 2020-02-22: 2.25 g via INTRAVENOUS
  Filled 2020-02-22: qty 50

## 2020-02-22 MED ORDER — HEPARIN SODIUM (PORCINE) 5000 UNIT/ML IJ SOLN
5000.0000 [IU] | Freq: Two times a day (BID) | INTRAMUSCULAR | Status: DC
  Administered 2020-02-22 – 2020-02-27 (×7): 5000 [IU] via SUBCUTANEOUS
  Filled 2020-02-22 (×9): qty 1

## 2020-02-22 MED ORDER — VANCOMYCIN HCL 10 G IV SOLR
2500.0000 mg | Freq: Once | INTRAVENOUS | Status: AC
  Administered 2020-02-22: 2500 mg via INTRAVENOUS
  Filled 2020-02-22: qty 2500

## 2020-02-22 MED ORDER — INSULIN ASPART 100 UNIT/ML ~~LOC~~ SOLN
0.0000 [IU] | Freq: Three times a day (TID) | SUBCUTANEOUS | Status: DC
  Administered 2020-02-24: 3 [IU] via SUBCUTANEOUS
  Administered 2020-02-25 (×2): 2 [IU] via SUBCUTANEOUS
  Administered 2020-02-26 (×2): 3 [IU] via SUBCUTANEOUS
  Administered 2020-02-26: 2 [IU] via SUBCUTANEOUS
  Administered 2020-02-27: 5 [IU] via SUBCUTANEOUS

## 2020-02-22 MED ORDER — DIPHENHYDRAMINE HCL 25 MG PO CAPS
25.0000 mg | ORAL_CAPSULE | Freq: Four times a day (QID) | ORAL | Status: DC | PRN
  Administered 2020-02-25: 25 mg via ORAL

## 2020-02-22 MED ORDER — METOPROLOL SUCCINATE ER 100 MG PO TB24
100.0000 mg | ORAL_TABLET | Freq: Every day | ORAL | Status: DC
  Administered 2020-02-22: 100 mg via ORAL
  Filled 2020-02-22 (×2): qty 1

## 2020-02-22 MED ORDER — PIPERACILLIN-TAZOBACTAM IN DEX 2-0.25 GM/50ML IV SOLN
2.2500 g | Freq: Three times a day (TID) | INTRAVENOUS | Status: DC
  Administered 2020-02-23 – 2020-02-24 (×4): 2.25 g via INTRAVENOUS
  Filled 2020-02-22 (×6): qty 50

## 2020-02-22 MED ORDER — CLOPIDOGREL BISULFATE 75 MG PO TABS
75.0000 mg | ORAL_TABLET | Freq: Every day | ORAL | Status: DC
  Administered 2020-02-22: 75 mg via ORAL
  Filled 2020-02-22 (×2): qty 1

## 2020-02-22 MED ORDER — RENA-VITE PO TABS
1.0000 | ORAL_TABLET | Freq: Every day | ORAL | Status: DC
  Administered 2020-02-22 – 2020-02-26 (×5): 1 via ORAL
  Filled 2020-02-22 (×7): qty 1

## 2020-02-22 NOTE — Progress Notes (Signed)
Pharmacy Antibiotic Note  Christy Zhang is a 64 y.o. female admitted on 02/22/2020 with wound infection - bilateral feet, gangrenous toes.  Pharmacy has been consulted for Zosyn dosing. Hx ESRD on HD.  Plan: Zosyn 2.25g IV q8h Monitor clinical progress, c/s, renal function F/u de-escalation plan/LOT  Height: 5\' 4"  (162.6 cm) (Simultaneous filing. User may not have seen previous data.) Weight: 106.6 kg (235 lb 0.2 oz) (Simultaneous filing. User may not have seen previous data.) IBW/kg (Calculated) : 54.7  Temp (24hrs), Avg:98.4 F (36.9 C), Min:98.4 F (36.9 C), Max:98.4 F (36.9 C)  Recent Labs  Lab 02/22/20 1152 02/22/20 1300  WBC 17.2*  --   CREATININE 6.76*  --   LATICACIDVEN 1.3 1.3    Estimated Creatinine Clearance: 10.3 mL/min (A) (by C-G formula based on SCr of 6.76 mg/dL (H)).    Allergies  Allergen Reactions  . Sulfa Antibiotics Swelling    Arturo Morton, PharmD, BCPS Please check AMION for all [place] contact numbers Clinical Pharmacist 02/22/2020 3:05 PM

## 2020-02-22 NOTE — Consult Note (Signed)
REASON FOR CONSULT:    Gangrene of both feet, left greater than right.  The consult is requested by Dr. Roosevelt Locks.   ASSESSMENT & PLAN:   PERIPHERAL VASCULAR DISEASE AND GANGRENE: This patient has severe tibial artery occlusive disease bilaterally based on her previous arteriogram.  She has dry gangrene of the right third toe and extensive grain grain of the entire left forefoot.  She has diabetes and end-stage renal disease.  The patient has severe tibial artery occlusive disease bilaterally.  Without attempted revascularization on the left would require a below the knee amputation as I do not think she would heal a transmetatarsal amputation.  I will tentatively put her on for an arteriogram tomorrow with Dr. Carlis Abbott although the schedule is very full and this may not be possible.  Regardless her only option is a tibial intervention on severely calcified arteries.  I have discussed the indications for the procedure and the potential complications and she is agreeable to proceed.  END-STAGE RENAL DISEASE: She dialyzes on Monday Wednesdays and Fridays.  If she is to do dialysis tomorrow please do it first thing in the morning as she is going to have an arteriogram later in the day.  Otherwise please do the dialysis on Thursday.  CHRONIC VENOUS INSUFFICIENCY: This patient has CEAP C4 venous disease.  Until her arterial disease is addressed she is not a candidate for elevation and compression.  Deitra Mayo, MD Office: [phone]   HPI:   Christy Zhang is a pleasant 64 y.o. female, who was previously followed by Dr. Ruta Hinds with peripheral vascular disease.  She had presented with bilateral lower extremity gangrene in July underwent a tibial intervention on the right lower extremity by Dr. Carlis Abbott with a good result.  She was to follow-up and have an arteriogram to address potentially the disease on the left side but failed to follow-up.  She presents to the emergency department now  tonight with extensive gangrene of the left forefoot and dry gangrene of the right third toe.  All these wounds are documented below.  The patient has end-stage renal disease and dialyzes on Monday Wednesdays and Fridays.  She last dialyzed on Monday.  Her risk factors for peripheral vascular disease include diabetes, hypertension, and hypercholesterolemia.  She denies any family history of premature cardiovascular disease or history of tobacco use.  Her situation is further complicated by her morbid obesity.  She denies any recent cardiac symptoms.  She has had no history of myocardial infarction or history of congestive heart failure.  Past Medical History:  Diagnosis Date  . Anemia   . Arthritis   . Asthma    in the past  . CHF (congestive heart failure) ([place])   . Chronic kidney disease    Renal failure, dialysis on M/W/F  . 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 End stage renal disease ([place])   . End stage renal disease on dialysis ([place])   . Hemodialysis access, arteriovenous graft ([place])   . Hyperlipidemia   . Hypertension   . Neuromuscular disorder ([place])     Family History  Problem Relation Age of Onset  . Diabetes Mother   . Hypertension Mother   . Cancer Father   . Diabetes Father   . Hypertension Sister   . Diabetes Daughter   . Heart disease Daughter   . Hypertension Daughter   . Diabetes Son   . Heart disease Son   . Hypertension Son   .  Peripheral vascular disease Son        amputation    SOCIAL HISTORY: Social History   Socioeconomic History  . Marital status: Widowed    Spouse name: Not on file  . Number of children: Not on file  . Years of education: Not on file  . Highest education level: Not on file  Occupational History  . Not on file  Tobacco Use  . Smoking status: Former Smoker    Types: Cigarettes    Quit date: 07/25/1984    Years since quitting: 35.6  . Smokeless tobacco: Never Used  Vaping Use  . Vaping Use: Never  used  Substance and Sexual Activity  . Alcohol use: No    Alcohol/week: 0.0 standard drinks  . Drug use: No  . Sexual activity: Not on file  Other Topics Concern  . Not on file  Social History Narrative  . Not on file   Social Determinants of Health   Financial Resource Strain:   . Difficulty of Paying Living Expenses: Not on file  Food Insecurity:   . Worried About Charity fundraiser in the Last Year: Not on file  . Ran Out of Food in the Last Year: Not on file  Transportation Needs:   . Lack of Transportation (Medical): Not on file  . Lack of Transportation (Non-Medical): Not on file  Physical Activity:   . Days of Exercise per Week: Not on file  . Minutes of Exercise per Session: Not on file  Stress:   . Feeling of Stress : Not on file  Social Connections:   . Frequency of Communication with Friends and Family: Not on file  . Frequency of Social Gatherings with Friends and Family: Not on file  . Attends Religious Services: Not on file  . Active Member of Clubs or Organizations: Not on file  . Attends Archivist Meetings: Not on file  . Marital Status: Not on file  Intimate Partner Violence:   . Fear of Current or Ex-Partner: Not on file  . Emotionally Abused: Not on file  . Physically Abused: Not on file  . Sexually Abused: Not on file    Allergies  Allergen Reactions  . Sulfa Antibiotics Swelling    Current Facility-Administered Medications  Medication Dose Route Frequency Provider Last Rate Last Admin  . acetaminophen (TYLENOL) tablet 650 mg  650 mg Oral Q6H PRN Lequita Halt, MD       Or  . acetaminophen (TYLENOL) suppository 650 mg  650 mg Rectal Q6H PRN Wynetta Fines T, MD      . amLODipine (NORVASC) tablet 5 mg  5 mg Oral QHS Wynetta Fines T, MD      . atorvastatin (LIPITOR) tablet 40 mg  40 mg Oral QHS Wynetta Fines T, MD      . clopidogrel (PLAVIX) tablet 75 mg  75 mg Oral Daily Wynetta Fines T, MD      . diphenhydrAMINE (BENADRYL) capsule 25 mg  25  mg Oral Q6H PRN Wynetta Fines T, MD      . heparin injection 5,000 Units  5,000 Units Subcutaneous Q12H Wynetta Fines T, MD      . HYDROmorphone (DILAUDID) injection 0.5-1 mg  0.5-1 mg Intravenous Q2H PRN Wynetta Fines T, MD      . insulin aspart (novoLOG) injection 0-15 Units  0-15 Units Subcutaneous TID WC Wynetta Fines T, MD      . insulin aspart (novoLOG) injection 0-5 Units  0-5 Units Subcutaneous  QHS Lequita Halt, MD      . [name] ON 02/23/2020] insulin glargine (LANTUS) injection 50 Units  50 Units Subcutaneous Daily Wynetta Fines T, MD      . metoprolol succinate (TOPROL-XL) 24 hr tablet 100 mg  100 mg Oral Daily Wynetta Fines T, MD      . multivitamin (RENA-VIT) tablet 1 tablet  1 tablet Oral QHS Wynetta Fines T, MD      . piperacillin-tazobactam (ZOSYN) IVPB 2.25 g  2.25 g Intravenous NOW Tyrone Apple, RPH      . [START ON 02/23/2020] piperacillin-tazobactam (ZOSYN) IVPB 2.25 g  2.25 g Intravenous Q8H Tyrone Apple, RPH      . sevelamer carbonate (RENVELA) tablet 1,600-2,400 mg  1,600-2,400 mg Oral See admin instructions Wynetta Fines T, MD      . sodium chloride flush (NS) 0.9 % injection 3 mL  3 mL Intravenous Q12H [name], MD      . sodium chloride flush (NS) 0.9 % injection 3 mL  3 mL Intravenous PRN [name], MD      . sodium chloride flush (NS) 0.9 % injection 3 mL  3 mL Intravenous Q12H Wynetta Fines T, MD      . vancomycin (VANCOCIN) 2,500 mg in sodium chloride 0.9 % 500 mL IVPB  2,500 mg Intravenous Once Dang, Thuy D, [facility]      . [START ON 02/23/2020] vancomycin (VANCOCIN) IVPB 1000 mg/200 mL premix  1,000 mg Intravenous Q M,W,F-HD Dang, Thuy D, [facility]       Current Outpatient Medications  Medication Sig Dispense Refill  . acetaminophen (TYLENOL) 325 MG tablet Take 2 tablets (650 mg total) by mouth every 6 (six) hours as needed for mild pain (or Fever >/= 101). 12 tablet 0  . amLODipine (NORVASC) 5 MG tablet Take 1 tablet (5 mg total) by mouth at bedtime. 90 tablet 1  .  atorvastatin (LIPITOR) 40 MG tablet Take 1 tablet (40 mg total) by mouth daily. (Patient taking differently: Take 40 mg by mouth at bedtime. ) 90 tablet 3  . clopidogrel (PLAVIX) 75 MG tablet Take 1 tablet (75 mg total) by mouth daily. 30 tablet 11  . diphenhydrAMINE (BENADRYL) 25 MG tablet Take 25 mg by mouth every 6 (six) hours as needed for itching.    . Dulaglutide (TRULICITY) 1.5 BS/4.9QP SOPN Inject 0.5 mLs (1.5 mg total) into the skin once a week. Saturday    . insulin glargine, 1 Unit Dial, (TOUJEO SOLOSTAR) 300 UNIT/ML Solostar Pen Inject 100 Units into the skin in the morning. 30 mL 11  . metoprolol succinate (TOPROL-XL) 100 MG 24 hr tablet TAKE ONE TABLET BY MOUTH DAILY. TAKE WITH OR IMMEDIATELY FOLLOWING A MEAL. (Patient taking differently: Take 100 mg by mouth daily. TAKE ONE TABLET BY MOUTH DAILY. TAKE WITH OR IMMEDIATELY FOLLOWING A MEAL.) 90 tablet 1  . multivitamin (RENA-VIT) TABS tablet Take 1 tablet by mouth at bedtime.     . sevelamer carbonate (RENVELA) 800 MG tablet Take 1,600-2,400 mg by mouth See admin instructions. Take 2400 mg with each meal and 1600 mg with each snack    . collagenase (SANTYL) ointment Apply 1 application topically daily. (Patient not taking: Reported on 02/22/2020) 90 g 0    REVIEW OF SYSTEMS:  [X]  denotes positive finding, [ ]  denotes negative finding Cardiac  Comments:  Chest pain or chest pressure:    Shortness of breath upon exertion:    Short of breath when  lying flat:    Irregular heart rhythm:        Vascular    Pain in calf, thigh, or hip brought on by ambulation: x   Pain in feet at night that wakes you up from your sleep:  x   Blood clot in your veins:    Leg swelling:         Pulmonary    Oxygen at home:    Productive cough:     Wheezing:         Neurologic    Sudden weakness in arms or legs:     Sudden numbness in arms or legs:     Sudden onset of difficulty speaking or slurred speech:    Temporary loss of vision in one eye:      Problems with dizziness:         Gastrointestinal    Blood in stool:     Vomited blood:         Genitourinary    Burning when urinating:     Blood in urine:        Psychiatric    Major depression:         Hematologic    Bleeding problems:    Problems with blood clotting too easily:        Skin    Rashes or ulcers:        Constitutional    Fever or chills:     PHYSICAL EXAM:   Vitals:   02/22/20 1147 02/22/20 1349  BP: (!) 178/61 (!) 150/69  Pulse: 95 90  Resp: 18 20  Temp: 98.4 F (36.9 C)   TempSrc: Oral   SpO2: 94% 100%  Weight: 106.6 kg   Height: 5\' 4"  (1.626 m)     GENERAL: The patient is a well-nourished female, in no acute distress. The vital signs are documented above. CARDIAC: There is a regular rate and rhythm.  VASCULAR: I do not detect carotid bruits. Her right upper arm graft is patent although the graft is somewhat pulsatile. She has palpable femoral pulses. On the right side she has a monophasic anterior tibial signal with a Doppler. On the left side she has a monophasic dorsalis pedis, posterior tibial, and peroneal signal. PULMONARY: There is good air exchange bilaterally without wheezing or rales. ABDOMEN: Soft and non-tender with normal pitched bowel sounds.  MUSCULOSKELETAL: There are no major deformities or cyanosis. NEUROLOGIC: No focal weakness or paresthesias are detected. SKIN: She has hyperpigmentation bilaterally consistent with chronic venous insufficiency. She has extensive gangrene of all the toes of the left foot.      She has dry gangrene of the right third toe.         PSYCHIATRIC: The patient has a normal affect.  DATA:    ARTERIOGRAM: I reviewed her previous arteriogram from July.  On the left side which is the more concerning side she had inline flow through the popliteal artery with severe tibial artery occlusive disease.  The tibials are diseased and markedly calcified.  On the right side, she has  single-vessel runoff via the anterior tibial artery which occludes above the ankle.  She underwent an anterior tibial artery intervention on the right.  LABS: Her white blood cell count is 17.2.  Hemoglobin is 8.2.  Her potassium was 3.7.  X-RAY RIGHT FOOT: Shows amputation of the second distal phalanx.  There is attenuation of the distal half of the third distal phalanx which could potentially represent prior partial  amputation versus osteomyelitis.  X-RAY LEFT FOOT: X-ray of the left foot shows no evidence of osteomyelitis.

## 2020-02-22 NOTE — Telephone Encounter (Signed)
Dr. Wynetta Fines has requested hospital consult for patient. Patient complications are osteomyelitis and gangrene. Please advise, thank you.  [phone] Direct...available til 8pm [facility]---location

## 2020-02-22 NOTE — ED Provider Notes (Signed)
[facility] EMERGENCY DEPARTMENT Provider Note   CSN: [id number] Arrival date & time: 02/22/20  1143     History Chief Complaint  Patient presents with  . Wound Check    Christy Zhang is a 64 y.o. female.  HPI Patient is a 64 year old female with a history of anemia, CHF, ESRD on dialysis, type 2 diabetes mellitus, HLD, who presents to the emergency department for wound check. Patient was evaluated by wound care earlier today and sent to the emergency department for further work-up. Patient presented to wound care this morning with left toe ulcers that she stated began turning black about 2 weeks ago starting with the left middle toe and expanding to the second and fourth digit on the left foot. Patient additionally reports a gangrenous right third toe as well but states this has been ongoing for many months. Per records, patient has been admitted for osteomyelitis of the third great toe in June of this year. She was most recently admitted for right-sided abdominal wall cellulitis and discharged for this on September 1. Patient denies any fevers, chills, nausea, vomiting, chest pain, shortness of breath.  Vascular ultrasound of the right leg with arterial duplex on May 6:  No evidence of stenosis. Probable tibial vessel disease.   Abdominal aortogram findings on July 22:  Aortogram showed widely patent aortoiliac segment with no flow-limiting stenosis bilaterally.  On the right she had patent common femoral, profunda, SFA, above and below-knee popliteal arterywith no flow limiting stenosis. She has severe tibial disease with single-vessel runoff via the anterior tibial that had multifocal high-grade stenosis in the midsegment of the artery and then ultimately occludes just above the ankle with significant small vessel disease in the right foot and essentially no runoff into the foot.  On the left she has a patent common femoral, profunda, SFA, above and  below-knee popliteal artery. Again she has severe tibial disease in the left lower extremity and appear to have a patent trifurcation but the posterior tibial occluded shortly after takeoff. She has a very diseased AT and peroneal artery heavily calcified with multiple segments of high-grade stenosis.  Ultimately the right anterior tibial artery was selected from the left transfemoral accessandthe anterior tibialarterywas angioplastied with a 3 mm [place]. No evidence of dissection with no residual stenosis and excellent flow down to the ankle where the AT occludes as demonstrated on imaging prior to intervention.     Past Medical History:  Diagnosis Date  . Anemia   . Arthritis   . Asthma    in the past  . CHF (congestive heart failure) ([place])   . Chronic kidney disease    Renal failure, dialysis on M/W/F  . 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 End stage renal disease ([place])   . End stage renal disease on dialysis ([place])   . Hemodialysis access, arteriovenous graft ([place])   . Hyperlipidemia   . Hypertension   . Neuromuscular disorder [facility])     Patient Active Problem List   Diagnosis Date Noted  . Abdominal wall cellulitis   . Panniculitis 12/29/2019  . Osteomyelitis of second toe of right foot ([place]) 10/15/2019  . Hyperglycemia due to diabetes mellitus ([place]) 10/15/2019  . Osteomyelitis of third toe of right foot ([place]) 10/14/2019  . Controlled type 2 diabetes mellitus with chronic kidney disease on chronic dialysis, with long-term current use of insulin ([place]) 12/02/2016  . ESRD (end stage renal disease) ([place])   .  Asthma, mild persistent 07/25/2015  . Hypertension secondary to other renal disorders 07/25/2015  . Diabetes ([place]) 03/30/2015  . Anemia in chronic kidney disease 02/17/2015  . 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 Essential (primary) hypertension 06/24/2014  . Heart failure ([place]) 06/24/2014  . Obesity, Class  III, BMI 40-49.9 (morbid obesity) ([place]) 06/24/2014    Past Surgical History:  Procedure Laterality Date  . A/V FISTULAGRAM Right 02/04/2017   Procedure: A/V Fistulagram;  Surgeon: Waynetta Sandy, MD;  Location: [facility];  Service: Cardiovascular;  Laterality: Right;  . ABDOMINAL AORTOGRAM W/LOWER EXTREMITY N/A 11/18/2019   Procedure: ABDOMINAL AORTOGRAM W/LOWER EXTREMITY;  Surgeon: [name], MD;  Location: [facility];  Service: Cardiovascular;  Laterality: N/A;  . AV FISTULA PLACEMENT Right 08/25/2014   Procedure: RIGHT BRACHIOCEPHALIC ARTERIOVENOUS (AV) FISTULA CREATION;  Surgeon: Mal Misty, MD;  Location: [facility];  Service: Vascular;  Laterality: Right;  . AV FISTULA PLACEMENT Right 03/18/2019   Procedure: INSERTION OF ARTERIOVENOUS (AV) GORE-TEX GRAFT RIGHT ARM;  Surgeon: Serafina Mitchell, MD;  Location: [facility];  Service: Vascular;  Laterality: Right;  . BASCILIC VEIN TRANSPOSITION Right 12/24/2018   Procedure: FIRST STAGE BASILIC VEIN TRANSPOSITION RIGHT UPPER ARM;  Surgeon: Serafina Mitchell, MD;  Location: [facility];  Service: Vascular;  Laterality: Right;  . FISTULA SUPERFICIALIZATION Right 11/03/2014   Procedure: RIGHT UPPER ARM FISTULA SUPERFICIALIZATION;  Surgeon: Mal Misty, MD;  Location: [facility];  Service: Vascular;  Laterality: Right;  . FISTULOGRAM Right 01/16/2016   Procedure: RIGHT UPPER EXTREMITY ARTERIOVENOUS FISTULOGRAM WITH BALLOON ANGIOPLASTY;  Surgeon: Vickie Epley, MD;  Location: [facility];  Service: Vascular;  Laterality: Right;  . INSERTION OF DIALYSIS CATHETER     X4  . IR FLUORO GUIDE CV LINE RIGHT  12/03/2018  . IR REMOVAL TUN CV CATH W/O FL  07/06/2019  . IR THROMBECTOMY AV FISTULA W/THROMBOLYSIS/PTA INC/SHUNT/IMG RIGHT Right 12/03/2018  . IR THROMBECTOMY AV FISTULA W/THROMBOLYSIS/PTA/STENT INC/SHUNT/IMG RT Right 10/11/2019  . IR US GUIDE VASC ACCESS RIGHT  12/03/2018  . IR US GUIDE VASC ACCESS RIGHT  12/03/2018  . IR US GUIDE VASC  ACCESS RIGHT  10/14/2019  . LIGATION OF COMPETING BRANCHES OF ARTERIOVENOUS FISTULA Right 11/03/2014   Procedure: LIGATION OF COMPETING BRANCHE x1 OF RIGHT UPPER ARM ARTERIOVENOUS FISTULA;  Surgeon: Mal Misty, MD;  Location: [facility];  Service: Vascular;  Laterality: Right;  . PERIPHERAL VASCULAR BALLOON ANGIOPLASTY Right 02/04/2017   Procedure: PERIPHERAL VASCULAR BALLOON ANGIOPLASTY;  Surgeon: Waynetta Sandy, MD;  Location: [facility];  Service: Cardiovascular;  Laterality: Right;  . PERIPHERAL VASCULAR BALLOON ANGIOPLASTY Right 11/18/2019   Procedure: PERIPHERAL VASCULAR BALLOON ANGIOPLASTY;  Surgeon: [name], MD;  Location: [facility];  Service: Cardiovascular;  Laterality: Right;  anterior tibial  . TUBAL LIGATION       OB History   No obstetric history on file.     Family History  Problem Relation Age of Onset  . Diabetes Mother   . Hypertension Mother   . Cancer Father   . Diabetes Father   . Hypertension Sister   . Diabetes Daughter   . Heart disease Daughter   . Hypertension Daughter   . Diabetes Son   . Heart disease Son   . Hypertension Son   . Peripheral vascular disease Son        amputation    Social History   Tobacco Use  .  Smoking status: Former Smoker    Types: Cigarettes    Quit date: 07/25/1984    Years since quitting: 35.6  . Smokeless tobacco: Never Used  Vaping Use  . Vaping Use: Never used  Substance Use Topics  . Alcohol use: No    Alcohol/week: 0.0 standard drinks  . Drug use: No    Home Medications Prior to Admission medications   Medication Sig Start Date End Date Taking? Authorizing Provider  acetaminophen (TYLENOL) 325 MG tablet Take 2 tablets (650 mg total) by mouth every 6 (six) hours as needed for mild pain (or Fever >/= 101). 10/15/19   Roxan Hockey, MD  amLODipine (NORVASC) 5 MG tablet Take 1 tablet (5 mg total) by mouth at bedtime. 08/17/19   Claretta Fraise, MD  atorvastatin (LIPITOR) 40 MG  tablet Take 1 tablet (40 mg total) by mouth daily. Patient taking differently: Take 40 mg by mouth at bedtime.  08/17/19   Claretta Fraise, MD  clopidogrel (PLAVIX) 75 MG tablet Take 1 tablet (75 mg total) by mouth daily. 11/18/19 11/17/20  [name], MD  collagenase (SANTYL) ointment Apply 1 application topically daily. 01/06/20   Claretta Fraise, MD  diphenhydrAMINE (BENADRYL) 25 MG tablet Take 25 mg by mouth every 6 (six) hours as needed for itching.    [provider]  Dulaglutide (TRULICITY) 1.5 YK/9.9IP SOPN Inject 0.5 mLs (1.5 mg total) into the skin once a week. Saturday 12/31/19   Barton Dubois, MD  insulin glargine, 1 Unit Dial, (TOUJEO SOLOSTAR) 300 UNIT/ML Solostar Pen Inject 100 Units into the skin in the morning. 01/13/20   Claretta Fraise, MD  metoprolol succinate (TOPROL-XL) 100 MG 24 hr tablet TAKE ONE TABLET BY MOUTH DAILY. TAKE WITH OR IMMEDIATELY FOLLOWING A MEAL. 01/13/20   Claretta Fraise, MD  multivitamin (RENA-VIT) TABS tablet Take 1 tablet by mouth at bedtime.     [provider]  sevelamer carbonate (RENVELA) 800 MG tablet Take 1,600-2,400 mg by mouth See admin instructions. Take 2400 mg with each meal and 1600 mg with each snack    [provider]    Allergies    Sulfa antibiotics  Review of Systems   Review of Systems  All other systems reviewed and are negative. Ten systems reviewed and are negative for acute change, except as noted in the HPI.   Physical Exam Updated Vital Signs BP (!) 178/61   Pulse 95   Temp 98.4 F (36.9 C) (Oral)   Resp 18   Ht 5\' 4"  (1.626 m) Comment: Simultaneous filing. User may not have seen previous data.  Wt 106.6 kg Comment: Simultaneous filing. User may not have seen previous data.  LMP  (LMP Unknown)   SpO2 94%   BMI 40.34 kg/m   Physical Exam Vitals and nursing note reviewed.  Constitutional:      General: She is not in acute distress.    Appearance: Normal appearance. She is not ill-appearing,  toxic-appearing or diaphoretic.  HENT:     Head: Normocephalic and atraumatic.     Right Ear: External ear normal.     Left Ear: External ear normal.     Nose: Nose normal.     Mouth/Throat:     Mouth: Mucous membranes are moist.     Pharynx: Oropharynx is clear. No oropharyngeal exudate or posterior oropharyngeal erythema.  Eyes:     General: No scleral icterus.       Right eye: No discharge.  Left eye: No discharge.     Extraocular Movements: Extraocular movements intact.     Conjunctiva/sclera: Conjunctivae normal.  Cardiovascular:     Rate and Rhythm: Normal rate and regular rhythm.     Pulses: Normal pulses.     Heart sounds: Normal heart sounds. No murmur heard.  No friction rub. No gallop.      Comments: RRR without M/R/G. Dopplerable DP pulses noted bilaterally. Pulmonary:     Effort: Pulmonary effort is normal. No respiratory distress.     Breath sounds: Normal breath sounds. No stridor. No wheezing, rhonchi or rales.  Abdominal:     General: Abdomen is flat.     Palpations: Abdomen is soft.     Tenderness: There is no abdominal tenderness.     Comments: Chronic wound noted to the right abdomen with clean dressing applied to the wound. No tenderness overlying the wound.   Musculoskeletal:        General: Swelling and tenderness present. Normal range of motion.     Cervical back: Normal range of motion and neck supple. No tenderness.  Skin:    General: Skin is warm and dry.     Findings: Erythema present.     Comments: Please see images below regarding the BLEs.  Neurological:     General: No focal deficit present.     Mental Status: She is alert and oriented to person, place, and time.  Psychiatric:        Mood and Affect: Mood normal.        Behavior: Behavior normal.           ED Results / Procedures / Treatments   Labs (all labs ordered are listed, but only abnormal results are displayed) Labs Reviewed  CBC WITH DIFFERENTIAL/PLATELET -  Abnormal; Notable for the following components:      Result Value   WBC 17.2 (*)    RBC 3.21 (*)    Hemoglobin 8.2 (*)    HCT 27.3 (*)    MCH 25.5 (*)    RDW 15.9 (*)    Platelets 640 (*)    Neutro Abs 12.8 (*)    Monocytes Absolute 1.1 (*)    Eosinophils Absolute 0.6 (*)    Abs Immature Granulocytes 0.13 (*)    All other components within normal limits  COMPREHENSIVE METABOLIC PANEL - Abnormal; Notable for the following components:   Creatinine, Ser 6.76 (*)    Calcium 8.4 (*)    Albumin 2.4 (*)    AST 9 (*)    GFR, Estimated 6 (*)    All other components within normal limits  CULTURE, BLOOD (ROUTINE X 2)  CULTURE, BLOOD (ROUTINE X 2)  RESPIRATORY PANEL BY RT PCR (FLU A&B, COVID)  LACTIC ACID, PLASMA  LACTIC ACID, PLASMA  URINALYSIS, ROUTINE W REFLEX MICROSCOPIC   EKG None  Radiology DG Foot Complete Left  Result Date: 02/22/2020 CLINICAL DATA:  Infection EXAM: LEFT FOOT - COMPLETE 3+ VIEW COMPARISON:  None. FINDINGS: Decreased osseous mineralization. No erosive changes identified. Distal phalanges are not as well evaluated due to flexion and overlap. 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 Extensive vascular calcification. IMPRESSION: No radiographic evidence of osteomyelitis. Distal phalanges are not as well evaluated. Electronically Signed   By: Macy Mis M.D.   On: 02/22/2020 12:31   DG Foot Complete Right  Result Date: 02/22/2020 CLINICAL DATA:  Evaluate for osteomyelitis EXAM: RIGHT FOOT COMPLETE - 3+ VIEW COMPARISON:  None. FINDINGS: Amputation of the second  distal phalanx. Attenuation of the distal half of the third distal phalanx which may reflect prior partial amputation versus osteomyelitis. No acute fracture or dislocation. Old healed fracture of the base of the fifth metatarsal. Generalized osteopenia. Severe arthropathy and malalignment of the midfoot as can be seen with a Charcot joint. No aggressive osseous lesion. Soft tissue are unremarkable. No  radiopaque foreign body or soft tissue emphysema. Peripheral vascular atherosclerotic disease. IMPRESSION: 1. Amputation of the second distal phalanx. 2. Attenuation of the distal half of the third distal phalanx which may reflect prior partial amputation versus osteomyelitis. Electronically Signed   By: Kathreen Devoid   On: 02/22/2020 13:42   Procedures Procedures (including critical care time)  Medications Ordered in ED Medications  piperacillin-tazobactam (ZOSYN) IVPB 2.25 g (has no administration in time range)  sodium chloride flush (NS) 0.9 % injection 3 mL (has no administration in time range)  amLODipine (NORVASC) tablet 5 mg (has no administration in time range)  atorvastatin (LIPITOR) tablet 40 mg (has no administration in time range)  metoprolol succinate (TOPROL-XL) 24 hr tablet 100 mg (has no administration in time range)  insulin glargine (1 Unit Dial) (TOUJEO) Solostar Pen SOPN 50 Units (has no administration in time range)  sevelamer carbonate (RENVELA) tablet 1,600-2,400 mg (has no administration in time range)  clopidogrel (PLAVIX) tablet 75 mg (has no administration in time range)  multivitamin (RENA-VIT) tablet 1 tablet (has no administration in time range)  diphenhydrAMINE (BENADRYL) tablet 25 mg (has no administration in time range)  insulin aspart (novoLOG) injection 0-15 Units (has no administration in time range)  insulin aspart (novoLOG) injection 0-5 Units (has no administration in time range)  heparin injection 5,000 Units (has no administration in time range)  acetaminophen (TYLENOL) tablet 650 mg (has no administration in time range)    Or  acetaminophen (TYLENOL) suppository 650 mg (has no administration in time range)  HYDROmorphone (DILAUDID) injection 0.5-1 mg (has no administration in time range)    ED Course  I have reviewed the triage vital signs and the nursing notes.  Pertinent labs & imaging results that were available during my care of the patient  were reviewed by me and considered in my medical decision making (see chart for details).  Clinical Course as of Feb 21 1546  Tue Feb 22, 2020  1236 Monday/Wednesday/Friday dialysis patient.  Creatinine(!): 6.76 [LJ]  1236 WBC(!): 17.2 [LJ]  1236 NEUT#(!): 12.8 [LJ]  1359 Lactic Acid, Venous: 1.3 [LJ]    Clinical Course User Index [LJ] Christy Sexton, PA-C   MDM Rules/Calculators/A&P                          Pt is a 64 y.o. female that presents with a history, physical exam, and ED Clinical Course as noted above.   Patient presents today with gangrene on the bilateral feet.  She was sent over from the wound clinic earlier today.  Patient has chronic gangrene to the right third toe as well as an acute worsening gangrene on toes 2 through 4 of the left foot that patient states began to worsen about 2 weeks ago.  Basic labs obtained showing a leukocytosis of 17.2 with a neutrophilia of 12.8.  Similar to prior values.  Elevated creatinine at 6.76 with a GFR of 6, consistent with her history of ESRD on HD.  Negative lactic acid x1.  Blood cultures obtained as well as a respiratory panel.  Patient started on Zosyn.  She is afebrile with a heart rate in the 90s.  Not hypoxic.  Mildly hypertensive with her most recent BP at 150/69.  Based on review of patient's records it appears that her symptoms are likely secondary to PVD more so than uncontrolled diabetes mellitus.  Her glucose today was 79.  A1c in June of this year was 6.6.  Patient had an aortogram performed in July of this year with findings as noted above in the HPI.  Given patient's symptoms as well as her new worsening symptoms in the left foot felt that admission was reasonable.  This was discussed with my attending physician Dr. Charlesetta Shanks who also evaluated the patient.  I discussed the possibility of admission with the patient and she is amenable.  This was discussed with the hospitalist team who will accept the patient into  their care at this time.  Note: Portions of this report may have been transcribed using voice recognition software. Every effort was made to ensure accuracy; however, inadvertent computerized transcription errors may be present.   Final Clinical Impression(s) / ED Diagnoses Final diagnoses:  Gangrene ([place])  SIRS (systemic inflammatory response syndrome) [facility])   Rx / DC Orders ED Discharge Orders    None       Christy Sexton, PA-C 02/22/20 1549    Charlesetta Shanks, MD 02/23/20 [phone]

## 2020-02-22 NOTE — Progress Notes (Addendum)
Pharmacy Antibiotic Note  Christy Zhang is a 64 y.o. female admitted on 02/22/2020 with gangrenous toes on bilateral feet and possible osteomyelitis on imaging.  Patient has been started on Zosyn and Pharmacy has been consulted to add vancomycin.  Patient has ESRD on MWF HD.  Afebrile, WBC 17.2.  Plan: Vanc 2500mg  IV x 1, then 1gm IV qHD MWF Continue Zosyn 2.25g IV Q8H F/U HD schedule/tolerance, clinical progress, vanc level as indicated  Height: 5\' 4"  (162.6 cm) (Simultaneous filing. User may not have seen previous data.) Weight: 106.6 kg (235 lb 0.2 oz) (Simultaneous filing. User may not have seen previous data.) IBW/kg (Calculated) : 54.7  Temp (24hrs), Avg:98.4 F (36.9 C), Min:98.4 F (36.9 C), Max:98.4 F (36.9 C)  Recent Labs  Lab 02/22/20 1152 02/22/20 1300  WBC 17.2*  --   CREATININE 6.76*  --   LATICACIDVEN 1.3 1.3    Estimated Creatinine Clearance: 10.3 mL/min (A) (by C-G formula based on SCr of 6.76 mg/dL (H)).    Allergies  Allergen Reactions  . Sulfa Antibiotics Swelling    Vanc 10/26 >> Zosyn 10/26 >>   10/26 covid / flu - 10/26 BCx -   Christy Zhang D. Mina Marble, PharmD, BCPS, [place] 02/22/2020, 4:33 PM

## 2020-02-22 NOTE — ED Notes (Signed)
Call to 4E to give report, RN to call back

## 2020-02-22 NOTE — ED Triage Notes (Signed)
Pt sent to ED from wound care center to check on left foot wound to evaluated for infection and possible sepsis.

## 2020-02-22 NOTE — Consult Note (Signed)
[facility] Nurse Consult Note: Reason for Consult: WOC Nursing is simultaneously consulted with Vascular Surgery. Patient is known to their service. Photos taken by Provider and in EMR are appreciated. Wound type: Mixed etiology: arterial insufficiency and venous insufficiency, plus infection Pressure Injury POA:N/A  There is no role for topical care recommendations at this time. Wounds are not draining and do not require dressings unless used for aesthetic purposes.  [facility] nursing team will not follow, but will remain available to this patient, the nursing and medical teams.  Please re-consult if needed. Thanks, Maudie Flakes, MSN, RN, [place], [facility]  Pager# [phone]

## 2020-02-22 NOTE — Consult Note (Signed)
Reason for Consult: Osteomyelitis, gangrene  Referring Physician: Dr. Wynetta Fines, MD  Christy Zhang is an 64 y.o. female.  HPI: 64 year old female was in the emergency room today, by the wound care center at [facility] for worsening gangrene to her feet.  She is known to the vascular surgery service. She has severe tibial artery occlusive disease bilaterally based on her previous arteriogram.  She has dry gangrene of the right third toe and extensive grain grain of the entire left forefoot.  She denies any drainage or pus coming from the toes.  Past Medical History:  Diagnosis Date  . Anemia   . Arthritis   . Asthma    in the past  . CHF (congestive heart failure) ([place])   . Chronic kidney disease    Renal failure, dialysis on M/W/F  . 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 End stage renal disease ([place])   . End stage renal disease on dialysis ([place])   . Hemodialysis access, arteriovenous graft ([place])   . Hyperlipidemia   . Hypertension   . Neuromuscular disorder [facility])     Past Surgical History:  Procedure Laterality Date  . A/V FISTULAGRAM Right 02/04/2017   Procedure: A/V Fistulagram;  Surgeon: Waynetta Sandy, MD;  Location: [place] [facility];  Service: Cardiovascular;  Laterality: Right;  . ABDOMINAL AORTOGRAM W/LOWER EXTREMITY N/A 11/18/2019   Procedure: ABDOMINAL AORTOGRAM W/LOWER EXTREMITY;  Surgeon: [name], MD;  Location: [place] [facility];  Service: Cardiovascular;  Laterality: N/A;  . AV FISTULA PLACEMENT Right 08/25/2014   Procedure: RIGHT BRACHIOCEPHALIC ARTERIOVENOUS (AV) FISTULA CREATION;  Surgeon: Mal Misty, MD;  Location: [facility] OR;  Service: Vascular;  Laterality: Right;  . AV FISTULA PLACEMENT Right 03/18/2019   Procedure: INSERTION OF ARTERIOVENOUS (AV) GORE-TEX GRAFT RIGHT ARM;  Surgeon: Serafina Mitchell, MD;  Location: [place];  Service: Vascular;  Laterality: Right;  . BASCILIC VEIN TRANSPOSITION Right 12/24/2018   Procedure:  FIRST STAGE BASILIC VEIN TRANSPOSITION RIGHT UPPER ARM;  Surgeon: Serafina Mitchell, MD;  Location: [place];  Service: Vascular;  Laterality: Right;  . FISTULA SUPERFICIALIZATION Right 11/03/2014   Procedure: RIGHT UPPER ARM FISTULA SUPERFICIALIZATION;  Surgeon: Mal Misty, MD;  Location: [place];  Service: Vascular;  Laterality: Right;  . FISTULOGRAM Right 01/16/2016   Procedure: RIGHT UPPER EXTREMITY ARTERIOVENOUS FISTULOGRAM WITH BALLOON ANGIOPLASTY;  Surgeon: Vickie Epley, MD;  Location: [facility];  Service: Vascular;  Laterality: Right;  . INSERTION OF DIALYSIS CATHETER     X4  . IR FLUORO GUIDE CV LINE RIGHT  12/03/2018  . IR REMOVAL TUN CV CATH W/O FL  07/06/2019  . IR THROMBECTOMY AV FISTULA W/THROMBOLYSIS/PTA INC/SHUNT/IMG RIGHT Right 12/03/2018  . IR THROMBECTOMY AV FISTULA W/THROMBOLYSIS/PTA/STENT INC/SHUNT/IMG RT Right 10/11/2019  . IR US GUIDE VASC ACCESS RIGHT  12/03/2018  . IR US GUIDE VASC ACCESS RIGHT  12/03/2018  . IR US GUIDE VASC ACCESS RIGHT  10/14/2019  . LIGATION OF COMPETING BRANCHES OF ARTERIOVENOUS FISTULA Right 11/03/2014   Procedure: LIGATION OF COMPETING BRANCHE x1 OF RIGHT UPPER ARM ARTERIOVENOUS FISTULA;  Surgeon: Mal Misty, MD;  Location: [place];  Service: Vascular;  Laterality: Right;  . PERIPHERAL VASCULAR BALLOON ANGIOPLASTY Right 02/04/2017   Procedure: PERIPHERAL VASCULAR BALLOON ANGIOPLASTY;  Surgeon: Waynetta Sandy, MD;  Location: [place] [facility];  Service: Cardiovascular;  Laterality: Right;  . PERIPHERAL VASCULAR BALLOON ANGIOPLASTY Right 11/18/2019   Procedure: PERIPHERAL VASCULAR BALLOON  ANGIOPLASTY;  Surgeon: [name], MD;  Location: [place] [facility];  Service: Cardiovascular;  Laterality: Right;  anterior tibial  . TUBAL LIGATION      Family History  Problem Relation Age of Onset  . Diabetes Mother   . Hypertension Mother   . Cancer Father   . Diabetes Father   . Hypertension Sister   . Diabetes Daughter   . Heart disease  Daughter   . Hypertension Daughter   . Diabetes Son   . Heart disease Son   . Hypertension Son   . Peripheral vascular disease Son        amputation    Social History:  reports that she quit smoking about 35 years ago. Her smoking use included cigarettes. She has never used smokeless tobacco. She reports that she does not drink alcohol and does not use drugs.  Allergies:  Allergies  Allergen Reactions  . Sulfa Antibiotics Swelling    Medications Reviewed   Results for orders placed or performed during the hospital encounter of 02/22/20 (from the past 48 hour(s))  CBC with Differential     Status: Abnormal   Collection Time: 02/22/20 11:52 AM  Result Value Ref Range   WBC 17.2 (H) 4.0 - 10.5 K/uL   RBC 3.21 (L) 3.87 - 5.11 MIL/uL   Hemoglobin 8.2 (L) 12.0 - 15.0 g/dL   HCT 27.3 (L) 36 - 46 %   MCV 85.0 80.0 - 100.0 fL   MCH 25.5 (L) 26.0 - 34.0 pg   MCHC 30.0 30.0 - 36.0 g/dL   RDW 15.9 (H) 11.5 - 15.5 %   Platelets 640 (H) 150 - 400 K/uL   nRBC 0.0 0.0 - 0.2 %   Neutrophils Relative % 74 %   Neutro Abs 12.8 (H) 1.7 - 7.7 K/uL   Lymphocytes Relative 15 %   Lymphs Abs 2.5 0.7 - 4.0 K/uL   Monocytes Relative 6 %   Monocytes Absolute 1.1 (H) 0.1 - 1.0 K/uL   Eosinophils Relative 4 %   Eosinophils Absolute 0.6 (H) 0.0 - 0.5 K/uL   Basophils Relative 0 %   Basophils Absolute 0.1 0.0 - 0.1 K/uL   Immature Granulocytes 1 %   Abs Immature Granulocytes 0.13 (H) 0.00 - 0.07 K/uL    Comment: Performed at [facility], [address], [place],  [address]  Comprehensive metabolic panel     Status: Abnormal   Collection Time: 02/22/20 11:52 AM  Result Value Ref Range   Sodium 137 135 - 145 mmol/L   Potassium 3.7 3.5 - 5.1 mmol/L   Chloride 99 98 - 111 mmol/L   CO2 26 22 - 32 mmol/L   Glucose, Bld 79 70 - 99 mg/dL    Comment: Glucose reference range applies only to samples taken after fasting for at least 8 hours.   BUN 18 8 - 23 mg/dL   Creatinine, Ser 6.76 (H)  0.44 - 1.00 mg/dL   Calcium 8.4 (L) 8.9 - 10.3 mg/dL   Total Protein 7.2 6.5 - 8.1 g/dL   Albumin 2.4 (L) 3.5 - 5.0 g/dL   AST 9 (L) 15 - 41 U/L   ALT 8 0 - 44 U/L   Alkaline Phosphatase 58 38 - 126 U/L   Total Bilirubin 0.5 0.3 - 1.2 mg/dL   GFR, Estimated 6 (L) >60 mL/min    Comment: (NOTE) Calculated using the CKD-EPI Creatinine Equation (2021)    Anion gap 12 5 - 15  Comment: Performed at [facility], [place] [address] [place], [place] [address]  Lactic acid, plasma     Status: None   Collection Time: 02/22/20 11:52 AM  Result Value Ref Range   Lactic Acid, Venous 1.3 0.5 - 1.9 mmol/L    Comment: Performed at [facility] [address], [place], [place] [address]  Lactic acid, plasma     Status: None   Collection Time: 02/22/20  1:00 PM  Result Value Ref Range   Lactic Acid, Venous 1.3 0.5 - 1.9 mmol/L    Comment: Performed at [facility] [address], [place], [place] [address]  Respiratory Panel by RT PCR (Flu A&B, Covid) - Nasopharyngeal Swab     Status: None   Collection Time: 02/22/20  3:37 PM   Specimen: Nasopharyngeal Swab  Result Value Ref Range   SARS Coronavirus 2 by RT PCR NEGATIVE NEGATIVE    Comment: (NOTE) SARS-CoV-2 target nucleic acids are NOT DETECTED.  The SARS-CoV-2 RNA is generally detectable in upper respiratoy specimens during the acute phase of infection. The lowest concentration of SARS-CoV-2 viral copies this assay can detect is 131 copies/mL. A negative result does not preclude SARS-Cov-2 infection and should not be used as the sole basis for treatment or other patient management decisions. A negative result may occur with  improper specimen collection/handling, submission of specimen other than nasopharyngeal swab, presence of viral mutation(s) within the areas targeted by this assay, and inadequate number of viral copies (<131 copies/mL). A negative result must be combined with clinical observations, patient history,  and epidemiological information. The expected result is Negative.  Fact Sheet for Patients:  PinkCheek.be  Fact Sheet for Healthcare Providers:  GravelBags.it  This test is no t yet approved or cleared by the [place] FDA and  has been authorized for detection and/or diagnosis of SARS-CoV-2 by FDA under an Emergency Use Authorization (EUA). This EUA will remain  in effect (meaning this test can be used) for the duration of the COVID-19 declaration under Section 564(b)(1) of the Act, 21 U.S.C. section 360bbb-3(b)(1), unless the authorization is terminated or revoked sooner.     Influenza A by PCR NEGATIVE NEGATIVE   Influenza B by PCR NEGATIVE NEGATIVE    Comment: (NOTE) The Xpert Xpress SARS-CoV-2/FLU/RSV assay is intended as an aid in  the diagnosis of influenza from Nasopharyngeal swab specimens and  should not be used as a sole basis for treatment. Nasal washings and  aspirates are unacceptable for Xpert Xpress SARS-CoV-2/FLU/RSV  testing.  Fact Sheet for Patients: PinkCheek.be  Fact Sheet for Healthcare Providers: GravelBags.it  This test is not yet approved or cleared by the [place] FDA and  has been authorized for detection and/or diagnosis of SARS-CoV-2 by  FDA under an Emergency Use Authorization (EUA). This EUA will remain  in effect (meaning this test can be used) for the duration of the  Covid-19 declaration under Section 564(b)(1) of the Act, 21  U.S.C. section 360bbb-3(b)(1), unless the authorization is  terminated or revoked. Performed at [facility], [place] [address] [place], [place] [address]     DG Foot Complete Left  Result Date: 02/22/2020 CLINICAL DATA:  Infection EXAM: LEFT FOOT - COMPLETE 3+ VIEW COMPARISON:  None. FINDINGS: Decreased osseous mineralization. No erosive changes identified. Distal phalanges are not as  well evaluated due to flexion and overlap. 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 Extensive vascular calcification. IMPRESSION: No radiographic evidence of osteomyelitis. Distal  phalanges are not as well evaluated. Electronically Signed   By: Macy Mis M.D.   On: 02/22/2020 12:31   DG Foot Complete Right  Result Date: 02/22/2020 CLINICAL DATA:  Evaluate for osteomyelitis EXAM: RIGHT FOOT COMPLETE - 3+ VIEW COMPARISON:  None. FINDINGS: Amputation of the second distal phalanx. Attenuation of the distal half of the third distal phalanx which may reflect prior partial amputation versus osteomyelitis. No acute fracture or dislocation. Old healed fracture of the base of the fifth metatarsal. Generalized osteopenia. Severe arthropathy and malalignment of the midfoot as can be seen with a Charcot joint. No aggressive osseous lesion. Soft tissue are unremarkable. No radiopaque foreign body or soft tissue emphysema. Peripheral vascular atherosclerotic disease. IMPRESSION: 1. Amputation of the second distal phalanx. 2. Attenuation of the distal half of the third distal phalanx which may reflect prior partial amputation versus osteomyelitis. Electronically Signed   By: Kathreen Devoid   On: 02/22/2020 13:42    Review of Systems Blood pressure (!) 141/62, pulse 85, temperature 98.4 F (36.9 C), temperature source Oral, resp. rate 17, height 5\' 4"  (1.626 m), weight 106.6 kg, SpO2 98 %. Physical Exam General: AAO x3, NAD  Dermatological: Diffuse gangrenous changes present to the left forefoot as well as the right 3rd toe.  Appears to be dry there is no drainage or pus.  Slight malodor.  There is no areas of fluctuation crepitation.  Vascular: Unable to palpate pulses  Neruologic: Decreased sensation with light touch  Musculoskeletal: No tenderness on exam.  Assessment/Plan: Osteomyelitis, gangrene with PAD  Reviewed the chart currently appears to be stable however given severe arterial disease she is  scheduled for angio hopefully tomorrow with vascular surgery.  Likely to need a left foot transmetatarsal rotation right 3rd toe amputation pending vascular intervention.  Continue antibiotics for now.  We will continue to follow.  Trula Slade 02/22/2020, 7:56 PM

## 2020-02-22 NOTE — Consult Note (Signed)
Renal Service Consult Note [facility]  Christy Zhang 02/22/2020 [name], MD Requesting Physician:  Dr. Roosevelt Locks  Reason for Consult: ESRD pt w/ gangrenous feet HPI: The patient is a 64 y.o. year-old w/ hx of DM2, ESRD on HD, DJD, anemia, HL and HTN presents w/ L foot infection/ gangrene and also some R foot osteo by xray. Pt is on HD MWF, asked to see for ESRD.    Pt seen in ED. Sent to ED by wound clinic in [place]. Gets HD at [facility] HD MWF for 5-6 yrs. RUA AVG.  Has R arm AVG, has had around 3 other fistulas in same arm. Gains 1-2 lbs. +DM and +HTN. Husband died 2 yrs ago, lives w/ dtr and grandkids.    ROS  denies CP  no joint pain   no HA  no blurry vision  no rash  no diarrhea  no nausea/ vomiting    Past Medical History  Past Medical History:  Diagnosis Date  . Anemia   . Arthritis   . Asthma    in the past  . CHF (congestive heart failure) ([place])   . Chronic kidney disease    Renal failure, dialysis on M/W/F  . 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 End stage renal disease ([place])   . End stage renal disease on dialysis ([place])   . Hemodialysis access, arteriovenous graft ([place])   . Hyperlipidemia   . Hypertension   . Neuromuscular disorder [facility])    Past Surgical History  Past Surgical History:  Procedure Laterality Date  . A/V FISTULAGRAM Right 02/04/2017   Procedure: A/V Fistulagram;  Surgeon: Waynetta Sandy, MD;  Location: [place] [facility];  Service: Cardiovascular;  Laterality: Right;  . ABDOMINAL AORTOGRAM W/LOWER EXTREMITY N/A 11/18/2019   Procedure: ABDOMINAL AORTOGRAM W/LOWER EXTREMITY;  Surgeon: [name], MD;  Location: [place] [facility];  Service: Cardiovascular;  Laterality: N/A;  . AV FISTULA PLACEMENT Right 08/25/2014   Procedure: RIGHT BRACHIOCEPHALIC ARTERIOVENOUS (AV) FISTULA CREATION;  Surgeon: Mal Misty, MD;  Location: [facility] OR;  Service: Vascular;  Laterality: Right;  . AV  FISTULA PLACEMENT Right 03/18/2019   Procedure: INSERTION OF ARTERIOVENOUS (AV) GORE-TEX GRAFT RIGHT ARM;  Surgeon: Serafina Mitchell, MD;  Location: [place];  Service: Vascular;  Laterality: Right;  . BASCILIC VEIN TRANSPOSITION Right 12/24/2018   Procedure: FIRST STAGE BASILIC VEIN TRANSPOSITION RIGHT UPPER ARM;  Surgeon: Serafina Mitchell, MD;  Location: [place];  Service: Vascular;  Laterality: Right;  . FISTULA SUPERFICIALIZATION Right 11/03/2014   Procedure: RIGHT UPPER ARM FISTULA SUPERFICIALIZATION;  Surgeon: Mal Misty, MD;  Location: [place];  Service: Vascular;  Laterality: Right;  . FISTULOGRAM Right 01/16/2016   Procedure: RIGHT UPPER EXTREMITY ARTERIOVENOUS FISTULOGRAM WITH BALLOON ANGIOPLASTY;  Surgeon: Vickie Epley, MD;  [facility];  Service: Vascular;  Laterality: Right;  . INSERTION OF DIALYSIS CATHETER     X4  . IR FLUORO GUIDE CV LINE RIGHT  12/03/2018  . IR REMOVAL TUN CV CATH W/O FL  07/06/2019  . IR THROMBECTOMY AV FISTULA W/THROMBOLYSIS/PTA INC/SHUNT/IMG RIGHT Right 12/03/2018  . IR THROMBECTOMY AV FISTULA W/THROMBOLYSIS/PTA/STENT INC/SHUNT/IMG RT Right 10/11/2019  . IR US GUIDE VASC ACCESS RIGHT  12/03/2018  . IR US GUIDE VASC ACCESS RIGHT  12/03/2018  . IR US GUIDE VASC ACCESS RIGHT  10/14/2019  . LIGATION OF COMPETING BRANCHES OF ARTERIOVENOUS FISTULA Right 11/03/2014   Procedure: LIGATION OF  COMPETING BRANCHE x1 OF RIGHT UPPER ARM ARTERIOVENOUS FISTULA;  Surgeon: Mal Misty, MD;  Location: [place];  Service: Vascular;  Laterality: Right;  . PERIPHERAL VASCULAR BALLOON ANGIOPLASTY Right 02/04/2017   Procedure: PERIPHERAL VASCULAR BALLOON ANGIOPLASTY;  Surgeon: Waynetta Sandy, MD;  Location: [place] [facility];  Service: Cardiovascular;  Laterality: Right;  . PERIPHERAL VASCULAR BALLOON ANGIOPLASTY Right 11/18/2019   Procedure: PERIPHERAL VASCULAR BALLOON ANGIOPLASTY;  Surgeon: [name], MD;  Location: [place] [facility];  Service: Cardiovascular;   Laterality: Right;  anterior tibial  . TUBAL LIGATION     Family History  Family History  Problem Relation Age of Onset  . Diabetes Mother   . Hypertension Mother   . Cancer Father   . Diabetes Father   . Hypertension Sister   . Diabetes Daughter   . Heart disease Daughter   . Hypertension Daughter   . Diabetes Son   . Heart disease Son   . Hypertension Son   . Peripheral vascular disease Son        amputation   Social History  reports that she quit smoking about 35 years ago. Her smoking use included cigarettes. She has never used smokeless tobacco. She reports that she does not drink alcohol and does not use drugs. Allergies  Allergies  Allergen Reactions  . Sulfa Antibiotics Swelling   Home medications Prior to Admission medications   Medication Sig Start Date End Date Taking? Authorizing Provider  acetaminophen (TYLENOL) 325 MG tablet Take 2 tablets (650 mg total) by mouth every 6 (six) hours as needed for mild pain (or Fever >/= 101). 10/15/19  Yes Emokpae, Courage, MD  amLODipine (NORVASC) 5 MG tablet Take 1 tablet (5 mg total) by mouth at bedtime. 08/17/19  Yes Claretta Fraise, MD  atorvastatin (LIPITOR) 40 MG tablet Take 1 tablet (40 mg total) by mouth daily. Patient taking differently: Take 40 mg by mouth at bedtime.  08/17/19  Yes Claretta Fraise, MD  clopidogrel (PLAVIX) 75 MG tablet Take 1 tablet (75 mg total) by mouth daily. 11/18/19 11/17/20 Yes [name], MD  diphenhydrAMINE (BENADRYL) 25 MG tablet Take 25 mg by mouth every 6 (six) hours as needed for itching.   Yes [provider]  Dulaglutide (TRULICITY) 1.5 EN/2.7PO SOPN Inject 0.5 mLs (1.5 mg total) into the skin once a week. Saturday 12/31/19  Yes Barton Dubois, MD  insulin glargine, 1 Unit Dial, (TOUJEO SOLOSTAR) 300 UNIT/ML Solostar Pen Inject 100 Units into the skin in the morning. 01/13/20  Yes [name], [name], MD  metoprolol succinate (TOPROL-XL) 100 MG 24 hr tablet TAKE ONE TABLET BY MOUTH  DAILY. TAKE WITH OR IMMEDIATELY FOLLOWING A MEAL. Patient taking differently: Take 100 mg by mouth daily. TAKE ONE TABLET BY MOUTH DAILY. TAKE WITH OR IMMEDIATELY FOLLOWING A MEAL. 01/13/20  Yes [name], [name], MD  multivitamin (RENA-VIT) TABS tablet Take 1 tablet by mouth at bedtime.    Yes [provider]  sevelamer carbonate (RENVELA) 800 MG tablet Take 1,600-2,400 mg by mouth See admin instructions. Take 2400 mg with each meal and 1600 mg with each snack   Yes [provider]  collagenase (SANTYL) ointment Apply 1 application topically daily. Patient not taking: Reported on 02/22/2020 01/06/20   Claretta Fraise, MD     Vitals:   02/22/20 1147 02/22/20 1349  BP: (!) 178/61 (!) 150/69  Pulse: 95 90  Resp: 18 20  Temp: 98.4 F (36.9 C)   TempSrc: Oral  SpO2: 94% 100%  Weight: 106.6 kg   Height: 5\' 4"  (1.626 m)    Exam Gen alert, no distress, lyilng flat No rash, cyanosis or gangrene Sclera anicteric, throat clear  No jvd or bruits Chest clear bilat to bases no rales RRR no MRG Abd soft ntnd no mass or ascites +bs obese GU defer MS no joint effusions or deformity Ext trace pretib edema, bilat gangrenous foot changes L > R Neuro is alert, Ox 3 , nf] RUA AVG+bruit     Home meds:  - norvasc 5/ plavix/ metoprolol xl 458/ lipitor  - trulicity weekly/ insulin glargine 100u   - renvela 3 ac tid/ mvi  - prn's/ vitamins/ supplements    OP HD: MWF [facility]   4h per pt, edw 105.5kg ?   Assessment/ Plan: 1. L foot infection /cellulitis/ gangrene - starting on vanc/ zosyn, VVS consulted.  Gangrene R foot as well.  2. ESRD - on HD MWF. Plan HD tomorrow.  3. BP / volume - cont home BP meds, no gross vol excess.  4. Anemia ckd - get records in am 5. MBD ckd - cont binder, get records in am 6. DM2 - on insulin       Kelly Splinter  MD 02/22/2020, 4:27 PM  Recent Labs  Lab 02/22/20 1152  WBC 17.2*  HGB 8.2*   Recent Labs  Lab 02/22/20 1152  K 3.7   BUN 18  CREATININE 6.76*  CALCIUM 8.4*

## 2020-02-22 NOTE — H&P (Signed)
History and Physical    Christy Zhang JME:[id number] DOB: 08/22/1955 DOA: 02/22/2020  PCP: Claretta Fraise, MD (Confirm with patient/family/NH records and if not entered, this has to be entered at [facility] point of entry) Patient coming from: Home  I have personally briefly reviewed patient's old medical records in [place]  Chief Complaint: Left foot infection  HPI: Christy Zhang is a 65 y.o. 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  Patient developed right third toe gangrene-like changes in summer, when patient was admitted to hospital for groin infection.  Patient was treated with Tressie Ellis for both infections, and vascular surgery work-up by vascular surgery showed bilateral third toe gangrene lung changes angiogram showed on July 22 that severe right tibial single-vessel runoff at mid segment with occlusion just above the ankle, on the left side, there is a severe tibial disease in the AT and peroneal artery heavily calcified with multiple segment high-grade stenosis with increased risk of limb loss..  Patient claimed that she has not been following with either vascular or podiatrist since July.  But she has been following with wound care center for the right groin wound.  Patient started noticed more discoloration changes of the right 2nd and 4th toe 3 to 4 days ago, denied any pain, and gradually onward she started to notice rash and warmness of the right leg below knee.  Denies any fever chills.  And she reported the right 3rd toe gangrene has remained stable.  Today patient went to wound care center for wound care of the right groin, where staff noticed extensive changes of the left foot concerning for acute infection and sent patient down to ER. ED Course: Patient has increased leukocytosis of 17 which  seems stable compared to 17.5 on last admission, x-ray showed left foot negative finding regarding acute bony changes.  Right 3rd toe showed signs of osteomyelitis.  Review of Systems: As per HPI otherwise 14 point review of systems negative.    Past Medical History:  Diagnosis Date   Anemia    Arthritis    Asthma    in the past   CHF (congestive heart failure) ([place])    Chronic kidney disease    Renal failure, dialysis on M/W/F   Diabetes mellitus with complication (HCC)    Type 2   End stage renal disease ([place])    End stage renal disease on dialysis [facility])    Hemodialysis access, arteriovenous graft ([place])    Hyperlipidemia    Hypertension    Neuromuscular disorder [facility])     Past Surgical History:  Procedure Laterality Date   A/V FISTULAGRAM Right 02/04/2017   Procedure: A/V Fistulagram;  Surgeon: Waynetta Sandy, MD;  Location: [place] [facility];  Service: Cardiovascular;  Laterality: Right;   ABDOMINAL AORTOGRAM W/LOWER EXTREMITY N/A 11/18/2019   Procedure: ABDOMINAL AORTOGRAM W/LOWER EXTREMITY;  Surgeon: [name], MD;  Location: [place] [facility];  Service: Cardiovascular;  Laterality: N/A;   AV FISTULA PLACEMENT Right 08/25/2014   Procedure: RIGHT BRACHIOCEPHALIC ARTERIOVENOUS (AV) FISTULA CREATION;  Surgeon: Mal Misty, MD;  Location: [facility] OR;  Service: Vascular;  Laterality: Right;   AV FISTULA PLACEMENT Right 03/18/2019   Procedure: INSERTION OF ARTERIOVENOUS (AV) GORE-TEX GRAFT RIGHT ARM;  Surgeon: Serafina Mitchell, MD;  Location: [place];  Service: Vascular;  Laterality: Right;   BASCILIC VEIN TRANSPOSITION Right 12/24/2018   Procedure: FIRST STAGE BASILIC VEIN TRANSPOSITION RIGHT UPPER ARM;  Surgeon: Serafina Mitchell, MD;  Location: [place];  Service: Vascular;  Laterality: Right;   FISTULA SUPERFICIALIZATION Right 11/03/2014   Procedure: RIGHT UPPER ARM FISTULA SUPERFICIALIZATION;  Surgeon: Mal Misty, MD;  Location: [place];   Service: Vascular;  Laterality: Right;   FISTULOGRAM Right 01/16/2016   Procedure: RIGHT UPPER EXTREMITY ARTERIOVENOUS FISTULOGRAM WITH BALLOON ANGIOPLASTY;  Surgeon: Vickie Epley, MD;  Location: [facility];  Service: Vascular;  Laterality: Right;   INSERTION OF DIALYSIS CATHETER     X4   IR FLUORO GUIDE CV LINE RIGHT  12/03/2018   IR REMOVAL TUN CV CATH W/O FL  07/06/2019   IR THROMBECTOMY AV FISTULA W/THROMBOLYSIS/PTA INC/SHUNT/IMG RIGHT Right 12/03/2018   IR THROMBECTOMY AV FISTULA W/THROMBOLYSIS/PTA/STENT INC/SHUNT/IMG RT Right 10/11/2019   IR US GUIDE VASC ACCESS RIGHT  12/03/2018   IR US GUIDE VASC ACCESS RIGHT  12/03/2018   IR US GUIDE VASC ACCESS RIGHT  10/14/2019   LIGATION OF COMPETING BRANCHES OF ARTERIOVENOUS FISTULA Right 11/03/2014   Procedure: LIGATION OF COMPETING BRANCHE x1 OF RIGHT UPPER ARM ARTERIOVENOUS FISTULA;  Surgeon: Mal Misty, MD;  Location: [place];  Service: Vascular;  Laterality: Right;   PERIPHERAL VASCULAR BALLOON ANGIOPLASTY Right 02/04/2017   Procedure: PERIPHERAL VASCULAR BALLOON ANGIOPLASTY;  Surgeon: Waynetta Sandy, MD;  Location: [place] [facility];  Service: Cardiovascular;  Laterality: Right;   PERIPHERAL VASCULAR BALLOON ANGIOPLASTY Right 11/18/2019   Procedure: PERIPHERAL VASCULAR BALLOON ANGIOPLASTY;  Surgeon: [name], MD;  Location: [place] [facility];  Service: Cardiovascular;  Laterality: Right;  anterior tibial   TUBAL LIGATION       reports that she quit smoking about 35 years ago. Her smoking use included cigarettes. She has never used smokeless tobacco. She reports that she does not drink alcohol and does not use drugs.  Allergies  Allergen Reactions   Sulfa Antibiotics Swelling    Family History  Problem Relation Age of Onset   Diabetes Mother    Hypertension Mother    Cancer Father    Diabetes Father    Hypertension Sister    Diabetes Daughter    Heart disease Daughter    Hypertension Daughter     Diabetes Son    Heart disease Son    Hypertension Son    Peripheral vascular disease Son        amputation     Prior to Admission medications   Medication Sig Start Date End Date Taking? Authorizing Provider  acetaminophen (TYLENOL) 325 MG tablet Take 2 tablets (650 mg total) by mouth every 6 (six) hours as needed for mild pain (or Fever >/= 101). 10/15/19  Yes Emokpae, Courage, MD  amLODipine (NORVASC) 5 MG tablet Take 1 tablet (5 mg total) by mouth at bedtime. 08/17/19  Yes Claretta Fraise, MD  atorvastatin (LIPITOR) 40 MG tablet Take 1 tablet (40 mg total) by mouth daily. Patient taking differently: Take 40 mg by mouth at bedtime.  08/17/19  Yes Claretta Fraise, MD  clopidogrel (PLAVIX) 75 MG tablet Take 1 tablet (75 mg total) by mouth daily. 11/18/19 11/17/20 Yes [name], MD  diphenhydrAMINE (BENADRYL) 25 MG tablet Take 25 mg by mouth every 6 (six) hours as needed for itching.   Yes [provider]  Dulaglutide (TRULICITY) 1.5 MP/5.3IR SOPN Inject 0.5 mLs (1.5 mg total) into the skin once a week.  Saturday 12/31/19  Yes Barton Dubois, MD  insulin glargine, 1 Unit Dial, (TOUJEO SOLOSTAR) 300 UNIT/ML Solostar Pen Inject 100 Units into the skin in the morning. 01/13/20  Yes [name], [name], MD  metoprolol succinate (TOPROL-XL) 100 MG 24 hr tablet TAKE ONE TABLET BY MOUTH DAILY. TAKE WITH OR IMMEDIATELY FOLLOWING A MEAL. Patient taking differently: Take 100 mg by mouth daily. TAKE ONE TABLET BY MOUTH DAILY. TAKE WITH OR IMMEDIATELY FOLLOWING A MEAL. 01/13/20  Yes [name], [name], MD  multivitamin (RENA-VIT) TABS tablet Take 1 tablet by mouth at bedtime.    Yes [provider]  sevelamer carbonate (RENVELA) 800 MG tablet Take 1,600-2,400 mg by mouth See admin instructions. Take 2400 mg with each meal and 1600 mg with each snack   Yes [provider]  collagenase (SANTYL) ointment Apply 1 application topically daily. Patient not taking: Reported on 02/22/2020 01/06/20    Claretta Fraise, MD    Physical Exam: Vitals:   02/22/20 1147 02/22/20 1349  BP: (!) 178/61 (!) 150/69  Pulse: 95 90  Resp: 18 20  Temp: 98.4 F (36.9 C)   TempSrc: Oral   SpO2: 94% 100%  Weight: 106.6 kg   Height: 5\' 4"  (1.626 m)     Constitutional: NAD, calm, comfortable Vitals:   02/22/20 1147 02/22/20 1349  BP: (!) 178/61 (!) 150/69  Pulse: 95 90  Resp: 18 20  Temp: 98.4 F (36.9 C)   TempSrc: Oral   SpO2: 94% 100%  Weight: 106.6 kg   Height: 5\' 4"  (1.626 m)    Eyes: PERRL, lids and conjunctivae normal ENMT: Mucous membranes are moist. Posterior pharynx clear of any exudate or lesions.Normal dentition.  Neck: normal, supple, no masses, no thyromegaly Respiratory: clear to auscultation bilaterally, no wheezing, no crackles. Normal respiratory effort. No accessory muscle use.  Cardiovascular: Regular rate and rhythm, no murmurs / rubs / gallops. No extremity edema. 2+ pedal pulses. No carotid bruits.  Abdomen: no tenderness, no masses palpated. No hepatosplenomegaly. Bowel sounds positive.  Musculoskeletal: no clubbing / cyanosis. No joint deformity upper and lower extremities. Good ROM, no contractures. Normal muscle tone.  Skin: Left 2nd-4th toe gangrene-like changes, shin and ankle and back of the foot warm to touch with rash and mild tenderness compared to the right side.  Right 3rd toe chronic gangrene Neurologic: CN 2-12 grossly intact. Sensation intact, DTR normal. Strength 5/5 in all 4.  Psychiatric: Normal judgment and insight. Alert and oriented x 3. Normal mood.    Labs on Admission: I have personally reviewed following labs and imaging studies  CBC: Recent Labs  Lab 02/22/20 1152  WBC 17.2*  NEUTROABS 12.8*  HGB 8.2*  HCT 27.3*  MCV 85.0  PLT 409*   Basic Metabolic Panel: Recent Labs  Lab 02/22/20 1152  NA 137  K 3.7  CL 99  CO2 26  GLUCOSE 79  BUN 18  CREATININE 6.76*  CALCIUM 8.4*   GFR: Estimated Creatinine Clearance: 10.3 mL/min  (A) (by C-G formula based on SCr of 6.76 mg/dL (H)). Liver Function Tests: Recent Labs  Lab 02/22/20 1152  AST 9*  ALT 8  ALKPHOS 58  BILITOT 0.5  PROT 7.2  ALBUMIN 2.4*   No results for input(s): LIPASE, AMYLASE in the last 168 hours. No results for input(s): AMMONIA in the last 168 hours. Coagulation Profile: No results for input(s): INR, PROTIME in the last 168 hours. Cardiac Enzymes: No results for input(s): CKTOTAL, CKMB, CKMBINDEX, TROPONINI in the last 168 hours.  BNP (last 3 results) No results for input(s): PROBNP in the last 8760 hours. HbA1C: No results for input(s): HGBA1C in the last 72 hours. CBG: No results for input(s): GLUCAP in the last 168 hours. Lipid Profile: No results for input(s): CHOL, HDL, LDLCALC, TRIG, CHOLHDL, LDLDIRECT in the last 72 hours. Thyroid Function Tests: No results for input(s): TSH, T4TOTAL, FREET4, T3FREE, THYROIDAB in the last 72 hours. Anemia Panel: No results for input(s): VITAMINB12, FOLATE, FERRITIN, TIBC, IRON, RETICCTPCT in the last 72 hours. Urine analysis: No results found for: COLORURINE, APPEARANCEUR, LABSPEC, [place], GLUCOSEU, HGBUR, BILIRUBINUR, KETONESUR, PROTEINUR, UROBILINOGEN, NITRITE, LEUKOCYTESUR  Radiological Exams on Admission: DG Foot Complete Left  Result Date: 02/22/2020 CLINICAL DATA:  Infection EXAM: LEFT FOOT - COMPLETE 3+ VIEW COMPARISON:  None. FINDINGS: Decreased osseous mineralization. No erosive changes identified. Distal phalanges are not as well evaluated due to flexion and overlap. 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 Extensive vascular calcification. IMPRESSION: No radiographic evidence of osteomyelitis. Distal phalanges are not as well evaluated. Electronically Signed   By: Macy Mis M.D.   On: 02/22/2020 12:31   DG Foot Complete Right  Result Date: 02/22/2020 CLINICAL DATA:  Evaluate for osteomyelitis EXAM: RIGHT FOOT COMPLETE - 3+ VIEW COMPARISON:  None. FINDINGS: Amputation of the  second distal phalanx. Attenuation of the distal half of the third distal phalanx which may reflect prior partial amputation versus osteomyelitis. No acute fracture or dislocation. Old healed fracture of the base of the fifth metatarsal. Generalized osteopenia. Severe arthropathy and malalignment of the midfoot as can be seen with a Charcot joint. No aggressive osseous lesion. Soft tissue are unremarkable. No radiopaque foreign body or soft tissue emphysema. Peripheral vascular atherosclerotic disease. IMPRESSION: 1. Amputation of the second distal phalanx. 2. Attenuation of the distal half of the third distal phalanx which may reflect prior partial amputation versus osteomyelitis. Electronically Signed   By: Kathreen Devoid   On: 02/22/2020 13:42    EKG: Independently reviewed.  Sinus rhythm with PAC  Assessment/Plan Active Problems:   * No active hospital problems. *  (please populate well all problems here in Problem List. (For example, if patient is on BP meds at home and you resume or decide to hold them, it is a problem that needs to be her. Same for CAD, COPD, HLD and so on)  Left foot infection suspect cellulitis and soft tissue infection as well as osteomyelitis -Vancomycin and Zosyn -There is also element of worsening of gangrene tenderness of the right 2-4th toe suspect there is a acute on chronic element.  Given the significant finding on arteriogram in summer, will not order MRI for now, instead will consult vascular surgery and podiatrist, placed call for both offices and left messages.  Because it looks like patient will need at least a partial foot amputation on the left side.  Right side is less concerning although the x-ray report as a osteomyelitis. -Consult wound care, consider PT once surgical plans settled.  ESRD on HD -Left nephrology message for resuming routine dialysis tomorrow  HTN -Controlled, continue home BP regimen  IDDM with insulin resistance -Blood sugar  borderline low, will cut long-acting Lantus to half and add sliding scale  PVD -Continue Plavix and statin.       DVT prophylaxis: Heparin subcu Code Status: Full code Family Communication: None at bedside Disposition Plan: Patient has complicated vascular issue came with worsening of foot infection and gangrene changes expect 3 to 5 days hospital stay for surgical intervention and  IV antibiotics. Consults called: Nephrology, vascular surgery and podiatry Admission status: Telemetry admission   Lequita Halt MD [facility] Pager [phone]  02/22/2020, 3:15 PM

## 2020-02-22 NOTE — ED Notes (Signed)
Call back to 4E to give report

## 2020-02-23 ENCOUNTER — Inpatient Hospital Stay (HOSPITAL_COMMUNITY)
Admission: EM | Disposition: A | Discharge status: Home / Self Care | Payer: Self-pay | Source: Home / Self Care | Attending: Family Medicine

## 2020-02-23 DIAGNOSIS — I10 Essential (primary) hypertension: Secondary | ICD-10-CM | POA: Diagnosis not present

## 2020-02-23 DIAGNOSIS — I96 Gangrene, not elsewhere classified: Secondary | ICD-10-CM | POA: Diagnosis not present

## 2020-02-23 HISTORY — PX: ABDOMINAL AORTOGRAM W/LOWER EXTREMITY: CATH118223

## 2020-02-23 HISTORY — PX: PERIPHERAL VASCULAR BALLOON ANGIOPLASTY: CATH118281

## 2020-02-23 LAB — CBC
HCT: 25.2 % — ABNORMAL LOW (ref 36.0–46.0)
Hemoglobin: 7.6 g/dL — ABNORMAL LOW (ref 12.0–15.0)
MCH: 25.4 pg — ABNORMAL LOW (ref 26.0–34.0)
MCHC: 30.2 g/dL (ref 30.0–36.0)
MCV: 84.3 fL (ref 80.0–100.0)
Platelets: 592 10*3/uL — ABNORMAL HIGH (ref 150–400)
RBC: 2.99 MIL/uL — ABNORMAL LOW (ref 3.87–5.11)
RDW: 16 % — ABNORMAL HIGH (ref 11.5–15.5)
WBC: 17.1 10*3/uL — ABNORMAL HIGH (ref 4.0–10.5)
nRBC: 0 % (ref 0.0–0.2)

## 2020-02-23 LAB — BASIC METABOLIC PANEL
Anion gap: 12 (ref 5–15)
BUN: 22 mg/dL (ref 8–23)
CO2: 24 mmol/L (ref 22–32)
Calcium: 8.4 mg/dL — ABNORMAL LOW (ref 8.9–10.3)
Chloride: 101 mmol/L (ref 98–111)
Creatinine, Ser: 7.56 mg/dL — ABNORMAL HIGH (ref 0.44–1.00)
GFR, Estimated: 6 mL/min — ABNORMAL LOW (ref >60)
Glucose, Bld: 99 mg/dL (ref 70–99)
Potassium: 3.8 mmol/L (ref 3.5–5.1)
Sodium: 137 mmol/L (ref 135–145)

## 2020-02-23 LAB — HEPATITIS B SURFACE ANTIGEN: Hepatitis B Surface Ag: NONREACTIVE

## 2020-02-23 LAB — GLUCOSE, CAPILLARY
Glucose-Capillary: 67 mg/dL — ABNORMAL LOW (ref 70–99)
Glucose-Capillary: 119 mg/dL — ABNORMAL HIGH (ref 70–99)
Glucose-Capillary: 72 mg/dL (ref 70–99)
Glucose-Capillary: 79 mg/dL (ref 70–99)
Glucose-Capillary: 139 mg/dL — ABNORMAL HIGH (ref 70–99)

## 2020-02-23 SURGERY — ABDOMINAL AORTOGRAM W/LOWER EXTREMITY
Anesthesia: LOCAL

## 2020-02-23 MED ORDER — HEPARIN SODIUM (PORCINE) 1000 UNIT/ML IJ SOLN
INTRAMUSCULAR | Status: DC | PRN
  Administered 2020-02-23: 10000 [IU] via INTRAVENOUS

## 2020-02-23 MED ORDER — ACETAMINOPHEN 325 MG PO TABS: 650.0000 mg | ORAL_TABLET | ORAL | Status: DC | PRN

## 2020-02-23 MED ORDER — CLOPIDOGREL BISULFATE 75 MG PO TABS
ORAL_TABLET | ORAL | Status: DC | PRN
  Administered 2020-02-23: 75 mg via ORAL

## 2020-02-23 MED ORDER — HEPARIN (PORCINE) IN NACL 1000-0.9 UT/500ML-% IV SOLN
INTRAVENOUS | Status: AC
  Filled 2020-02-23: qty 500

## 2020-02-23 MED ORDER — CLOPIDOGREL BISULFATE 75 MG PO TABS
75.0000 mg | ORAL_TABLET | Freq: Every day | ORAL | Status: DC
  Administered 2020-02-24 – 2020-02-26 (×3): 75 mg via ORAL
  Filled 2020-02-23 (×3): qty 1

## 2020-02-23 MED ORDER — MIDAZOLAM HCL 2 MG/2ML IJ SOLN
INTRAMUSCULAR | Status: AC
  Filled 2020-02-23: qty 2

## 2020-02-23 MED ORDER — LIDOCAINE HCL (PF) 1 % IJ SOLN
INTRAMUSCULAR | Status: AC
  Filled 2020-02-23: qty 30

## 2020-02-23 MED ORDER — ASPIRIN 81 MG PO CHEW
CHEWABLE_TABLET | ORAL | Status: DC | PRN
  Administered 2020-02-23: 81 mg via ORAL

## 2020-02-23 MED ORDER — HEPARIN (PORCINE) IN NACL 1000-0.9 UT/500ML-% IV SOLN
INTRAVENOUS | Status: DC | PRN
  Administered 2020-02-23: 500 mL

## 2020-02-23 MED ORDER — HYDRALAZINE HCL 20 MG/ML IJ SOLN: 5.0000 mg | INTRAMUSCULAR | Status: DC | PRN

## 2020-02-23 MED ORDER — SODIUM CHLORIDE 0.9 % IV SOLN: 250.0000 mL | INTRAVENOUS | Status: DC | PRN

## 2020-02-23 MED ORDER — FENTANYL CITRATE (PF) 100 MCG/2ML IJ SOLN
INTRAMUSCULAR | Status: DC | PRN
  Administered 2020-02-23: 25 ug via INTRAVENOUS
  Administered 2020-02-23: 50 ug via INTRAVENOUS
  Administered 2020-02-23: 25 ug via INTRAVENOUS

## 2020-02-23 MED ORDER — INSULIN GLARGINE 100 UNIT/ML ~~LOC~~ SOLN
50.0000 [IU] | Freq: Every day | SUBCUTANEOUS | Status: DC
  Filled 2020-02-23 (×3): qty 0.5

## 2020-02-23 MED ORDER — METOPROLOL SUCCINATE ER 100 MG PO TB24
100.0000 mg | ORAL_TABLET | Freq: Every day | ORAL | Status: DC
  Administered 2020-02-23 – 2020-02-26 (×4): 100 mg via ORAL
  Filled 2020-02-23 (×4): qty 1

## 2020-02-23 MED ORDER — CLOPIDOGREL BISULFATE 75 MG PO TABS
ORAL_TABLET | ORAL | Status: AC
  Filled 2020-02-23: qty 1

## 2020-02-23 MED ORDER — AMLODIPINE BESYLATE 10 MG PO TABS
10.0000 mg | ORAL_TABLET | Freq: Every day | ORAL | Status: DC
  Administered 2020-02-23 – 2020-02-26 (×3): 10 mg via ORAL
  Filled 2020-02-23 (×4): qty 1

## 2020-02-23 MED ORDER — DEXTROSE 50 % IV SOLN
INTRAVENOUS | Status: AC
  Administered 2020-02-23: 25 mL
  Filled 2020-02-23: qty 50

## 2020-02-23 MED ORDER — VANCOMYCIN HCL IN DEXTROSE 1-5 GM/200ML-% IV SOLN
INTRAVENOUS | Status: AC
  Administered 2020-02-23: 1000 mg via INTRAVENOUS
  Filled 2020-02-23: qty 200

## 2020-02-23 MED ORDER — SODIUM CHLORIDE 0.9% FLUSH: 3.0000 mL | INTRAVENOUS | Status: DC | PRN

## 2020-02-23 MED ORDER — MIDAZOLAM HCL 2 MG/2ML IJ SOLN
INTRAMUSCULAR | Status: DC | PRN
  Administered 2020-02-23 (×2): 1 mg via INTRAVENOUS

## 2020-02-23 MED ORDER — FENTANYL CITRATE (PF) 100 MCG/2ML IJ SOLN
INTRAMUSCULAR | Status: AC
  Filled 2020-02-23: qty 2

## 2020-02-23 MED ORDER — LABETALOL HCL 5 MG/ML IV SOLN: 10.0000 mg | INTRAVENOUS | Status: DC | PRN

## 2020-02-23 MED ORDER — ONDANSETRON HCL 4 MG/2ML IJ SOLN: 4.0000 mg | Freq: Four times a day (QID) | INTRAMUSCULAR | Status: DC | PRN

## 2020-02-23 MED ORDER — SODIUM CHLORIDE 0.9% FLUSH
3.0000 mL | Freq: Two times a day (BID) | INTRAVENOUS | Status: DC
  Administered 2020-02-23 – 2020-02-27 (×7): 3 mL via INTRAVENOUS

## 2020-02-23 MED ORDER — ASPIRIN EC 81 MG PO TBEC
81.0000 mg | DELAYED_RELEASE_TABLET | Freq: Every day | ORAL | Status: DC
  Administered 2020-02-24 – 2020-02-27 (×3): 81 mg via ORAL
  Filled 2020-02-23 (×4): qty 1

## 2020-02-23 MED ORDER — HEPARIN SODIUM (PORCINE) 1000 UNIT/ML IJ SOLN
INTRAMUSCULAR | Status: AC
  Filled 2020-02-23: qty 1

## 2020-02-23 MED ORDER — ASPIRIN 81 MG PO CHEW
CHEWABLE_TABLET | ORAL | Status: AC
  Filled 2020-02-23: qty 1

## 2020-02-23 MED ORDER — HEPARIN SODIUM (PORCINE) 1000 UNIT/ML DIALYSIS: 1500.0000 [IU] | INTRAMUSCULAR | Status: DC | PRN

## 2020-02-23 SURGICAL SUPPLY — 19 items
BALLN COYOTE OTW 2.5X220X150 (BALLOONS) ×3
BALLOON COYOTE OTW 2.5X220X150 (BALLOONS) ×2 IMPLANT
CATH OMNI FLUSH 5F 65CM (CATHETERS) ×3 IMPLANT
DEVICE CLOSURE MYNXGRIP 6/7F (Vascular Products) ×3 IMPLANT
DEVICE TORQUE .025-.038 (MISCELLANEOUS) ×3 IMPLANT
GLIDEWIRE ADV .035X180CM (WIRE) ×3 IMPLANT
GUIDEWIRE ZILIENT 6G 014 (WIRE) ×3 IMPLANT
KIT ENCORE 26 ADVANTAGE (KITS) ×3 IMPLANT
KIT MICROPUNCTURE NIT STIFF (SHEATH) ×3 IMPLANT
KIT PV (KITS) ×3 IMPLANT
MAT PREVALON FULL STRYKER (MISCELLANEOUS) ×3 IMPLANT
SHEATH PINNACLE 5F 10CM (SHEATH) ×3 IMPLANT
SHEATH PINNACLE 6F 10CM (SHEATH) ×3 IMPLANT
SHEATH PROBE COVER 6X72 (BAG) ×3 IMPLANT
SHEATH SHUTTLE SELECT 6F (SHEATH) ×3 IMPLANT
SYR MEDRAD MARK V 150ML (SYRINGE) ×3 IMPLANT
TRANSDUCER W/STOPCOCK (MISCELLANEOUS) ×3 IMPLANT
TRAY PV CATH (CUSTOM PROCEDURE TRAY) ×3 IMPLANT
WIRE BENTSON .035X145CM (WIRE) ×3 IMPLANT

## 2020-02-23 NOTE — Op Note (Signed)
Patient name: Christy Zhang MRN: [id number] DOB: [birth year] Sex: female  02/22/2020 - 02/23/2020 Pre-operative Diagnosis: Critical limb ischemia of the bilateral lower extremities with tissue loss Post-operative diagnosis:  Same Surgeon:  [name], MD Assistant:  Yevonne Aline. Stanford Breed, MD Procedure Performed: 1.  Ultrasound guided access of right common femoral artery 2.  Aortogram including catheter selection of aorta 3.  Bilateral lower extremity arteriogram with selection of third order branches in the left lower extremity 4.  Left anterior tibial artery angioplasty (2.5 mm x 220 mm Coyote) 5.  Mynx closure of the right common femoral artery 6.  67 minutes of monitored moderate conscious sedation time  Indications: Patient is a 64 year old female well-known to the vascular surgery service who has previously undergone a right leg tibial intervention for critical limb ischemia.  She was seen yesterday in consultation by Dr. Scot Dock with gangrene of her left toes as well as gangrene of a right toe as well.  She presents today for planned lower extremity arteriogram and possible intervention with a focus on the left leg.  Findings:   Aortogram showed no flow-limiting stenosis in the aortoiliac segment.  Left lower extremity arteriogram showed a patent common femoral, profunda, SFA, above and below-knee popliteal artery with no flow limiting stenosis.  Patient has severe tibial disease.  Dominant runoff appears to be the anterior tibial that had multifocal high-grade >80% stenosis throughout a long segment over 200 mm.  The peroneal was also patent although was very small and diminutive and had some high-grade stenosis proximally.  The posterior tibial occluded shortly after takeoff from the TP trunk.  The left anterior tibial artery disease was crossed antegrade and the entire anterior tibial was angioplastied with a 2.5 mm Coyote.  No evidence of residual stenosis and no  dissection.  She now has inline flow down the left lower extremity with a patent AT with flow into the foot.  Right lower extremity runoff  was obtained at the end of the case and this shows a patent common femoral profunda SFA popliteal and inline flow through the anterior tibial although it does have diffuse moderate disease.   Procedure:  The patient was identified in the holding area and taken to room 8.  The patient was then placed supine on the table and prepped and draped in the usual sterile fashion.  A time out was called.  Ultrasound was used to evaluate the right common femoral artery.  It was patent .  A digital ultrasound image was acquired.  A micropuncture needle was used to access the right common femoral artery under ultrasound guidance.  An 018 wire was advanced without resistance and a micropuncture sheath was placed.  The 018 wire was removed and a benson wire was placed.  The micropuncture sheath was exchanged for a 5 french sheath.  An omniflush catheter was advanced over the wire to the level of L-1.  An abdominal angiogram was obtained.  Next, using the omniflush catheter and a benson wire, the aortic bifurcation was crossed and the catheter was placed into theleft external iliac artery and left runoff was obtained.  Only decision was made to intervene.  Ultimately the aortic bifurcation was crossed with a Omni Flush catheter and Glidewire advantage.  Once the wire was advanced into the SFA a long 6 French 90 cm shuttle sheath was placed.  Patient was given 100 units per kilogram heparin.  The anterior tibial artery was then cannulated with an 0.014 Zilient  wire and the lesion was crossed all the way into the ankle and confirmed on hand-injection that we were in the true lumen.  The entire AT was then angioplastied with a 2.5 mm Coyote throughout its entire length for 2 minutes for a prolonged inflation time.  No evidence of residual stenosis or dissection with inline flow down the left  lower extremity through the anterior tibial into the foot.  That point in time wires and catheters were removed.  Retrograde runoff in the right groin sheath was then obtained to evaluate the right leg and the anterior tibial intervention remains patent although diffusely diseased.  Mynx closure was deployed in the right groin.  Taken to holding in stable condition.  Plan: Patient will continue dual antiplatelet therapy.  Should will be posted for left TMA tomorrow.  [name], MD [facility] Office: [phone]

## 2020-02-23 NOTE — Progress Notes (Signed)
[facility] Progress Note  Subjective: seen on HD, no new c/o's.   Vitals:   02/23/20 0758 02/23/20 0808 02/23/20 0823 02/23/20 0838  BP: (!) 134/58 (!) 162/83 (!) 158/89 (!) 138/122  Pulse:      Resp: 17 16 16 15   Temp:      TempSrc:      SpO2:      Weight:      Height:        Exam: Gen alert, no distress No jvd or bruits Chest clear bilat RRR no MRG Abd soft ntnd no mass or ascites +bs obese Ext min pretib edema, bilat gangrenous changes L > R foot Neuro is alert, Ox 3 , nf RUA AVG+bruit     Home meds:  - norvasc 5/ plavix/ metoprolol xl 681/ lipitor  - trulicity weekly/ insulin glargine 100u   - renvela 3 ac tid/ mvi  - prn's/ vitamins/ supplements    OP HD: MWF [facility]   4h per pt, edw 105.5kg ?   Assessment/ Plan: 1. L foot infection /cellulitis/ gangrene - started IV abx vanc/ zosyn, VVS planning angio later today.  2. ESRD - on HD MWF. HD this am.  3. BP / volume - cont home BP meds, no gross vol excess.  4. Anemia ckd - Hb 8, records pend 5. MBD ckd - cont binder, records pend 6. DM2 - on insulin      Rob Christasia Angeletti 02/23/2020, 9:12 AM   Recent Labs  Lab 02/22/20 1152 02/23/20 0049  K 3.7 3.8  BUN 18 22  CREATININE 6.76* 7.56*  CALCIUM 8.4* 8.4*  HGB 8.2* 7.6*   Inpatient medications: . amLODipine  10 mg Oral QHS  . atorvastatin  40 mg Oral QHS  . Chlorhexidine Gluconate Cloth  6 each Topical Q0600  . clopidogrel  75 mg Oral Daily  . heparin  5,000 Units Subcutaneous Q12H  . insulin aspart  0-15 Units Subcutaneous TID WC  . insulin aspart  0-5 Units Subcutaneous QHS  . [START ON 02/24/2020] insulin glargine  50 Units Subcutaneous Daily  . metoprolol succinate  100 mg Oral Daily  . multivitamin  1 tablet Oral QHS  . sevelamer carbonate  2,400 mg Oral TID WC   And  . sevelamer carbonate  1,600 mg Oral With snacks  . sodium chloride flush  3 mL Intravenous Q12H   . piperacillin-tazobactam (ZOSYN)  IV Stopped  (02/23/20 0053)  . vancomycin     acetaminophen **OR** acetaminophen, diphenhydrAMINE, [START ON 02/24/2020] heparin, HYDROmorphone (DILAUDID) injection

## 2020-02-23 NOTE — Progress Notes (Signed)
I attempted to see patient earlier today he was in the Cath Lab.  Per the note she is scheduled for surgery tomorrow eoyj vascular surgery for amputation TMA left foot.  At this point podiatry will sign off as vascular surgery is performing surgical intervention.  However if there are any changes or need any further assistance please contact us.

## 2020-02-23 NOTE — Progress Notes (Signed)
PROGRESS NOTE    Christy Zhang  BJS:[id number] DOB: Aug 16, 1955 DOA: 02/22/2020 PCP: Claretta Fraise, MD    Chief Complaint  Patient presents with  . Wound Check    Brief Narrative:  Related prior history of peripheral vascular disease with chronic right toe gangrene, end-stage renal disease on Monday Wednesday Friday, insulin-dependent diabetes mellitus, hypertension and hyperlipidemia presents with gangrenous changes of the left foot.  Of note patient was found to have a third toe gangrene underwent angiogram, showing severe tibial artery occlusive disease bilaterally, underwent right anterior tibial artery angioplasty. Podiatry, vascular surgery on board and she is scheduled for angiogram after hemodialysis today. Patient seen and examined at bedside, currently in dialysis.  Assessment & Plan:   Active Problems:   Diabetes ([place])   Anemia in chronic kidney disease   Essential (primary) hypertension   Osteomyelitis of third toe of right foot (HCC)   Osteomyelitis (HCC)   Hyperglycemia due to diabetes mellitus ([place])   Gangrene (HCC)    Gangrene of the left foot/severe peripheral vascular disease X-rays do not show any osteomyelitis at this time. Patient has severe tibial artery occlusive disease bilaterally based on her previous arteriogram. She is scheduled for an arteriogram today postdialysis.  Further recommendations to follow, per podiatry and vascular surgery. She was empirically started on vancomycin and Zosyn on admission. Continue with Plavix and Lipitor.    Essential hypertension Blood pressure parameters are suboptimal, increase Norvasc to 10 mg daily. Metoprolol 100 mg daily.    Anemia of chronic disease secondary to diabetes and end-stage renal disease. Transfuse to keep hemoglobin greater than 7.     Insulin-dependent diabetes mellitus CBG (last 3)  Recent Labs    02/22/20 2142 02/23/20 0621 02/23/20 0716  GLUCAP 98 67* 119*   Hypoglycemic  this morning, probably secondary to n.p.o. status.  Hold insulin today.    End-stage renal disease on dialysis. Nephrology on board and she is in dialysis at this time.   Leukocytosis/thrombocytosis probably secondary to gangrene Blood cultures ordered and are pending   DVT prophylaxis: Heparin Code Status: Full code Family Communication: None at bedside  disposition:   Status is: Inpatient  Remains inpatient appropriate because:Ongoing diagnostic testing needed not appropriate for outpatient work up, Unsafe d/c plan and IV treatments appropriate due to intensity of illness or inability to take PO   Dispo: The patient is from: Home              Anticipated d/c is to: Pending              Anticipated d/c date is: > 3 days              Patient currently is not medically stable to d/c.       Consultants:   Podiatry Dr. Earleen Newport  Vascular surgery  Nephrology   Procedures:   HD  ANGIOGRAM TODAY.  Antimicrobials:  Antibiotics Given (last 72 hours)    Date/Time Action Medication Dose Rate   02/22/20 1733 New Bag/Given   piperacillin-tazobactam (ZOSYN) IVPB 2.25 g 2.25 g 100 mL/hr   02/22/20 1945 New Bag/Given   vancomycin (VANCOCIN) 2,500 mg in sodium chloride 0.9 % 500 mL IVPB 2,500 mg 250 mL/hr   02/23/20 0023 New Bag/Given   piperacillin-tazobactam (ZOSYN) IVPB 2.25 g 2.25 g 100 mL/hr       Subjective: No chest pain or sob.   Objective: Vitals:   02/22/20 2239 02/23/20 0000 02/23/20 0400 02/23/20 0734  BP: (!) 166/58 Marland Kitchen)  131/39 (!) 140/48 (!) 153/55  Pulse:  85 84 78  Resp:  18 16 17   Temp:  98.6 F (37 C) 98.4 F (36.9 C) 98.6 F (37 C)  TempSrc:  Oral Oral Oral  SpO2:  100% 95% 99%  Weight:      Height:        Intake/Output Summary (Last 24 hours) at 02/23/2020 0819 Last data filed at 02/23/2020 0400 Gross per 24 hour  Intake 50 ml  Output --  Net 50 ml   Filed Weights   02/22/20 1147  Weight: 106.6 kg    Examination:  General  exam: Appears calm and comfortable  Respiratory system: Clear to auscultation. Respiratory effort normal. Cardiovascular system: S1 & S2 heard, RRR. No JVD,  Gastrointestinal system: Abdomen is nondistended, soft and nontender. Normal bowel sounds heard. Central nervous system: Alert and oriented. No focal neurological deficits. Extremities: left foot gangrene,  Skin: left foot gangrenous changes/right 3 rd toe involvement.  Psychiatry:  Mood & affect appropriate.     Data Reviewed: I have personally reviewed following labs and imaging studies  CBC: Recent Labs  Lab 02/22/20 1152 02/23/20 0049  WBC 17.2* 17.1*  NEUTROABS 12.8*  --   HGB 8.2* 7.6*  HCT 27.3* 25.2*  MCV 85.0 84.3  PLT 640* 592*    Basic Metabolic Panel: Recent Labs  Lab 02/22/20 1152 02/23/20 0049  NA 137 137  K 3.7 3.8  CL 99 101  CO2 26 24  GLUCOSE 79 99  BUN 18 22  CREATININE 6.76* 7.56*  CALCIUM 8.4* 8.4*    GFR: Estimated Creatinine Clearance: 9.2 mL/min (A) (by C-G formula based on SCr of 7.56 mg/dL (H)).  Liver Function Tests: Recent Labs  Lab 02/22/20 1152  AST 9*  ALT 8  ALKPHOS 58  BILITOT 0.5  PROT 7.2  ALBUMIN 2.4*    CBG: Recent Labs  Lab 02/22/20 1957 02/22/20 2142 02/23/20 0621 02/23/20 0716  GLUCAP 99 98 67* 119*     Recent Results (from the past 240 hour(s))  Blood culture (routine x 2)     Status: None (Preliminary result)   Collection Time: 02/22/20  1:00 PM   Specimen: BLOOD RIGHT ARM  Result Value Ref Range Status   Specimen Description BLOOD RIGHT ARM  Final   Special Requests   Final    BOTTLES DRAWN AEROBIC AND ANAEROBIC Blood Culture results may not be optimal due to an inadequate volume of blood received in culture bottles   Culture   Final    NO GROWTH < 24 HOURS Performed at [facility], [place] [address], [place], [place] [address]    Report Status PENDING  Incomplete  Respiratory Panel by RT PCR (Flu A&B, Covid) - Nasopharyngeal Swab      Status: None   Collection Time: 02/22/20  3:37 PM   Specimen: Nasopharyngeal Swab  Result Value Ref Range Status   SARS Coronavirus 2 by RT PCR NEGATIVE NEGATIVE Final    Comment: (NOTE) SARS-CoV-2 target nucleic acids are NOT DETECTED.  The SARS-CoV-2 RNA is generally detectable in upper respiratoy specimens during the acute phase of infection. The lowest concentration of SARS-CoV-2 viral copies this assay can detect is 131 copies/mL. A negative result does not preclude SARS-Cov-2 infection and should not be used as the sole basis for treatment or other patient management decisions. A negative result may occur with  improper specimen collection/handling, submission of specimen other than nasopharyngeal swab, presence of viral mutation(s)  within the areas targeted by this assay, and inadequate number of viral copies (<131 copies/mL). A negative result must be combined with clinical observations, patient history, and epidemiological information. The expected result is Negative.  Fact Sheet for Patients:  PinkCheek.be  Fact Sheet for Healthcare Providers:  GravelBags.it  This test is no t yet approved or cleared by the [place] FDA and  has been authorized for detection and/or diagnosis of SARS-CoV-2 by FDA under an Emergency Use Authorization (EUA). This EUA will remain  in effect (meaning this test can be used) for the duration of the COVID-19 declaration under Section 564(b)(1) of the Act, 21 U.S.C. section 360bbb-3(b)(1), unless the authorization is terminated or revoked sooner.     Influenza A by PCR NEGATIVE NEGATIVE Final   Influenza B by PCR NEGATIVE NEGATIVE Final    Comment: (NOTE) The Xpert Xpress SARS-CoV-2/FLU/RSV assay is intended as an aid in  the diagnosis of influenza from Nasopharyngeal swab specimens and  should not be used as a sole basis for treatment. Nasal washings and  aspirates are  unacceptable for Xpert Xpress SARS-CoV-2/FLU/RSV  testing.  Fact Sheet for Patients: PinkCheek.be  Fact Sheet for Healthcare Providers: GravelBags.it  This test is not yet approved or cleared by the [place] FDA and  has been authorized for detection and/or diagnosis of SARS-CoV-2 by  FDA under an Emergency Use Authorization (EUA). This EUA will remain  in effect (meaning this test can be used) for the duration of the  Covid-19 declaration under Section 564(b)(1) of the Act, 21  U.S.C. section 360bbb-3(b)(1), unless the authorization is  terminated or revoked. Performed at [facility], [place] [address] [place], [place] [address]          Radiology Studies: DG Foot Complete Left  Result Date: 02/22/2020 CLINICAL DATA:  Infection EXAM: LEFT FOOT - COMPLETE 3+ VIEW COMPARISON:  None. FINDINGS: Decreased osseous mineralization. No erosive changes identified. Distal phalanges are not as well evaluated due to flexion and overlap. 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 Extensive vascular calcification. IMPRESSION: No radiographic evidence of osteomyelitis. Distal phalanges are not as well evaluated. Electronically Signed   By: Macy Mis M.D.   On: 02/22/2020 12:31   DG Foot Complete Right  Result Date: 02/22/2020 CLINICAL DATA:  Evaluate for osteomyelitis EXAM: RIGHT FOOT COMPLETE - 3+ VIEW COMPARISON:  None. FINDINGS: Amputation of the second distal phalanx. Attenuation of the distal half of the third distal phalanx which may reflect prior partial amputation versus osteomyelitis. No acute fracture or dislocation. Old healed fracture of the base of the fifth metatarsal. Generalized osteopenia. Severe arthropathy and malalignment of the midfoot as can be seen with a Charcot joint. No aggressive osseous lesion. Soft tissue are unremarkable. No radiopaque foreign body or soft tissue emphysema. Peripheral vascular  atherosclerotic disease. IMPRESSION: 1. Amputation of the second distal phalanx. 2. Attenuation of the distal half of the third distal phalanx which may reflect prior partial amputation versus osteomyelitis. Electronically Signed   By: Kathreen Devoid   On: 02/22/2020 13:42        Scheduled Meds: . amLODipine  5 mg Oral QHS  . atorvastatin  40 mg Oral QHS  . Chlorhexidine Gluconate Cloth  6 each Topical Q0600  . clopidogrel  75 mg Oral Daily  . heparin  5,000 Units Subcutaneous Q12H  . insulin aspart  0-15 Units Subcutaneous TID WC  . insulin aspart  0-5 Units Subcutaneous QHS  . insulin glargine  50  Units Subcutaneous Daily  . metoprolol succinate  100 mg Oral Daily  . multivitamin  1 tablet Oral QHS  . sevelamer carbonate  2,400 mg Oral TID WC   And  . sevelamer carbonate  1,600 mg Oral With snacks  . sodium chloride flush  3 mL Intravenous Q12H   Continuous Infusions: . piperacillin-tazobactam (ZOSYN)  IV Stopped (02/23/20 0053)  . vancomycin       LOS: 1 day        Hosie Poisson, MD [facility]   To contact the attending provider between 7A-7P or the covering provider during after hours 7P-7A, please log into the web site [email or web address] and access using universal [place] password for that web site. If you do not have the password, please call the hospital operator.  02/23/2020, 8:19 AM

## 2020-02-23 NOTE — Consult Note (Signed)
[facility] Nurse Consult Note: Reason for Consult: Cellulitis to abdomen.  Seen by surgeon as well as wound care center this week.  Surgery debrided wound in office and ordered NS moist kerlix to open wound and wound care center agreed. Patient admitted for gangrenous changes to bilateral feet.  Will continue abdominal wound care as ordered.  Wound type:infectious  Previous cellulitis Pressure Injury POA: NA Wound bed: 30% slough and 70% pale pink nongranulating Drainage (amount, consistency, odor)minimal serosanguinous   Periwound:erythema Dressing procedure/placement/frequency: Cleanse abdominal wound with NS and pat dry. Apply NS moist kerlix to wound bed. Cover with dry gauze and ABD pad/tape.  Change twice daily.  Will not follow at this time.  Please re-consult if needed.  Domenic Moras MSN, RN, FNP-BC CWON Wound, Ostomy, Continence Nurse Pager [phone]

## 2020-02-23 NOTE — Progress Notes (Signed)
Inpatient Diabetes Program Recommendations  AACE/ADA: New Consensus Statement on Inpatient Glycemic Control (2015)  Target Ranges:  Prepandial:   less than 140 mg/dL      Peak postprandial:   less than 180 mg/dL (1-2 hours)      Critically ill patients:  140 - 180 mg/dL   Lab Results  Component Value Date   GLUCAP 72 02/23/2020   HGBA1C 6.2 01/06/2020    Review of Glycemic Control Results for Christy Zhang, Christy Zhang (MRN [id number]) as of 02/23/2020 14:16  Ref. Range 02/22/2020 19:57 02/22/2020 21:42 02/23/2020 06:21 02/23/2020 07:16 02/23/2020 12:37  Glucose-Capillary Latest Ref Range: 70 - 99 mg/dL 99 98 67 (L) 119 (H) 72   Diabetes history:  DM2 Outpatient Diabetes medications:  Toujeo 633 units daily Trulicity 1.5 weekly Current orders for Inpatient glycemic control:  Lantus 50 to start 10/28 Novolog 0-15 units tid & 0-5 qhs  Note:  Spoke with patient at bedside.  Verified above medications.  She denies difficulties obtaining insulins and medications.  She administers insulin as prescribed.  She checks her blood sugar occasionally.  When she checks her blood sugar her sugar is usually around 200 mg/dL.  She admits to drinking beverages with sugar and eating too many CHO's.  Educated her on the Plate Method and importance of good blood sugar control for avoidance of long and short term complications and optimal healing.    Will continue to follow while inpatient.  Thank you, Reche Dixon, RN, BSN Diabetes Coordinator Inpatient Diabetes Program [phone] (team pager from 8a-5p)

## 2020-02-24 ENCOUNTER — Inpatient Hospital Stay (HOSPITAL_COMMUNITY): Payer: Medicare Other | Admitting: Anesthesiology

## 2020-02-24 ENCOUNTER — Encounter (HOSPITAL_COMMUNITY)
Admission: EM | Disposition: A | Discharge status: Home / Self Care | Payer: Self-pay | Source: Home / Self Care | Attending: Family Medicine

## 2020-02-24 ENCOUNTER — Encounter (HOSPITAL_COMMUNITY): Payer: Self-pay | Admitting: Vascular Surgery

## 2020-02-24 ENCOUNTER — Ambulatory Visit: Payer: Medicare Other | Admitting: General Surgery

## 2020-02-24 DIAGNOSIS — M869 Osteomyelitis, unspecified: Secondary | ICD-10-CM

## 2020-02-24 DIAGNOSIS — I96 Gangrene, not elsewhere classified: Secondary | ICD-10-CM | POA: Diagnosis not present

## 2020-02-24 HISTORY — PX: TRANSMETATARSAL AMPUTATION: SHX6197

## 2020-02-24 LAB — GLUCOSE, CAPILLARY
Glucose-Capillary: 125 mg/dL — ABNORMAL HIGH (ref 70–99)
Glucose-Capillary: 114 mg/dL — ABNORMAL HIGH (ref 70–99)
Glucose-Capillary: 104 mg/dL — ABNORMAL HIGH (ref 70–99)
Glucose-Capillary: 122 mg/dL — ABNORMAL HIGH (ref 70–99)
Glucose-Capillary: 172 mg/dL — ABNORMAL HIGH (ref 70–99)
Glucose-Capillary: 114 mg/dL — ABNORMAL HIGH (ref 70–99)

## 2020-02-24 LAB — SURGICAL PCR SCREEN
MRSA, PCR: NEGATIVE
Staphylococcus aureus: NEGATIVE

## 2020-02-24 SURGERY — AMPUTATION, FOOT, TRANSMETATARSAL
Anesthesia: Regional | Site: Toe | Laterality: Left

## 2020-02-24 MED ORDER — BACITRACIN ZINC 500 UNIT/GM EX OINT
TOPICAL_OINTMENT | CUTANEOUS | Status: AC
  Filled 2020-02-24: qty 28.35

## 2020-02-24 MED ORDER — MIDAZOLAM HCL 2 MG/2ML IJ SOLN
1.0000 mg | Freq: Once | INTRAMUSCULAR | Status: AC
  Administered 2020-02-24: 1 mg via INTRAVENOUS

## 2020-02-24 MED ORDER — PHENYLEPHRINE HCL-NACL 10-0.9 MG/250ML-% IV SOLN
INTRAVENOUS | Status: DC | PRN
  Administered 2020-02-24: 10 ug/min via INTRAVENOUS

## 2020-02-24 MED ORDER — PROPOFOL 500 MG/50ML IV EMUL
INTRAVENOUS | Status: DC | PRN
  Administered 2020-02-24: 75 ug/kg/min via INTRAVENOUS

## 2020-02-24 MED ORDER — 0.9 % SODIUM CHLORIDE (POUR BTL) OPTIME
TOPICAL | Status: DC | PRN
  Administered 2020-02-24: 1000 mL

## 2020-02-24 MED ORDER — ONDANSETRON HCL 4 MG/2ML IJ SOLN
INTRAMUSCULAR | Status: DC | PRN
  Administered 2020-02-24: 4 mg via INTRAVENOUS

## 2020-02-24 MED ORDER — BACITRACIN ZINC 500 UNIT/GM EX OINT
TOPICAL_OINTMENT | CUTANEOUS | Status: DC | PRN
  Administered 2020-02-24: 1 via TOPICAL

## 2020-02-24 MED ORDER — CHLORHEXIDINE GLUCONATE 0.12 % MT SOLN
OROMUCOSAL | Status: AC
  Filled 2020-02-24: qty 15

## 2020-02-24 MED ORDER — CHLORHEXIDINE GLUCONATE 0.12 % MT SOLN
15.0000 mL | OROMUCOSAL | Status: AC
  Filled 2020-02-24: qty 15

## 2020-02-24 MED ORDER — SODIUM CHLORIDE 0.9 % IV SOLN: INTRAVENOUS | Status: DC

## 2020-02-24 MED ORDER — LIDOCAINE 2% (20 MG/ML) 5 ML SYRINGE
INTRAMUSCULAR | Status: DC | PRN
  Administered 2020-02-24: 40 mg via INTRAVENOUS

## 2020-02-24 MED ORDER — BUPIVACAINE-EPINEPHRINE (PF) 0.5% -1:200000 IJ SOLN
INTRAMUSCULAR | Status: DC | PRN
  Administered 2020-02-24: 30 mL via PERINEURAL

## 2020-02-24 MED ORDER — FENTANYL CITRATE (PF) 250 MCG/5ML IJ SOLN
INTRAMUSCULAR | Status: AC
  Filled 2020-02-24: qty 5

## 2020-02-24 MED ORDER — LIDOCAINE 2% (20 MG/ML) 5 ML SYRINGE
INTRAMUSCULAR | Status: AC
  Filled 2020-02-24: qty 5

## 2020-02-24 MED ORDER — DEXAMETHASONE SODIUM PHOSPHATE 10 MG/ML IJ SOLN
INTRAMUSCULAR | Status: AC
  Filled 2020-02-24: qty 1

## 2020-02-24 MED ORDER — MIDAZOLAM HCL 2 MG/2ML IJ SOLN
INTRAMUSCULAR | Status: AC
  Filled 2020-02-24: qty 2

## 2020-02-24 MED ORDER — FENTANYL CITRATE (PF) 100 MCG/2ML IJ SOLN
50.0000 ug | Freq: Once | INTRAMUSCULAR | Status: AC
  Administered 2020-02-24: 50 ug via INTRAVENOUS

## 2020-02-24 MED ORDER — FENTANYL CITRATE (PF) 100 MCG/2ML IJ SOLN
INTRAMUSCULAR | Status: AC
  Filled 2020-02-24: qty 2

## 2020-02-24 MED ORDER — PROPOFOL 10 MG/ML IV BOLUS
INTRAVENOUS | Status: AC
  Filled 2020-02-24: qty 20

## 2020-02-24 MED ORDER — FENTANYL CITRATE (PF) 100 MCG/2ML IJ SOLN
INTRAMUSCULAR | Status: DC | PRN
  Administered 2020-02-24: 25 ug via INTRAVENOUS

## 2020-02-24 MED FILL — Lidocaine HCl Local Preservative Free (PF) Inj 1%: INTRAMUSCULAR | Qty: 30 | Status: AC

## 2020-02-24 SURGICAL SUPPLY — 47 items
BLADE AVERAGE 25X9 (BLADE) ×2 IMPLANT
BLADE SAW SGTL 81X20 HD (BLADE) IMPLANT
BNDG CONFORM 3 STRL LF (GAUZE/BANDAGES/DRESSINGS) IMPLANT
BNDG ELASTIC 4X5.8 VLCR STR LF (GAUZE/BANDAGES/DRESSINGS) ×2 IMPLANT
BNDG GAUZE ELAST 4 BULKY (GAUZE/BANDAGES/DRESSINGS) ×2 IMPLANT
CANISTER SUCT 3000ML PPV (MISCELLANEOUS) ×2 IMPLANT
COVER SURGICAL LIGHT HANDLE (MISCELLANEOUS) ×2 IMPLANT
COVER WAND RF STERILE (DRAPES) IMPLANT
DRAPE HALF SHEET 40X57 (DRAPES) ×2 IMPLANT
DRSG ADAPTIC 3X8 NADH LF (GAUZE/BANDAGES/DRESSINGS) ×2 IMPLANT
ELECT REM PT RETURN 9FT ADLT (ELECTROSURGICAL) ×2
ELECTRODE REM PT RTRN 9FT ADLT (ELECTROSURGICAL) ×1 IMPLANT
GAUZE SPONGE 4X4 12PLY STRL (GAUZE/BANDAGES/DRESSINGS) IMPLANT
GAUZE SPONGE 4X4 12PLY STRL LF (GAUZE/BANDAGES/DRESSINGS) ×2 IMPLANT
GLOVE BIO SURGEON STRL SZ 6.5 (GLOVE) ×2 IMPLANT
GLOVE BIO SURGEON STRL SZ7.5 (GLOVE) ×2 IMPLANT
GLOVE BIOGEL PI IND STRL 6.5 (GLOVE) ×1 IMPLANT
GLOVE BIOGEL PI IND STRL 7.5 (GLOVE) ×1 IMPLANT
GLOVE BIOGEL PI INDICATOR 6.5 (GLOVE) ×1
GLOVE BIOGEL PI INDICATOR 7.5 (GLOVE) ×1
GLOVE ECLIPSE 7.0 STRL STRAW (GLOVE) ×2 IMPLANT
GOWN STRL REUS W/ TWL LRG LVL3 (GOWN DISPOSABLE) ×2 IMPLANT
GOWN STRL REUS W/ TWL XL LVL3 (GOWN DISPOSABLE) ×1 IMPLANT
GOWN STRL REUS W/TWL LRG LVL3 (GOWN DISPOSABLE) ×2
GOWN STRL REUS W/TWL XL LVL3 (GOWN DISPOSABLE) ×1
KIT BASIN OR (CUSTOM PROCEDURE TRAY) ×2 IMPLANT
KIT TURNOVER KIT B (KITS) ×2 IMPLANT
NEEDLE HYPO 25GX1X1/2 BEV (NEEDLE) IMPLANT
NS IRRIG 1000ML POUR BTL (IV SOLUTION) ×2 IMPLANT
PACK GENERAL/GYN (CUSTOM PROCEDURE TRAY) ×2 IMPLANT
PAD ARMBOARD 7.5X6 YLW CONV (MISCELLANEOUS) ×4 IMPLANT
SPECIMEN JAR SMALL (MISCELLANEOUS) IMPLANT
STAPLER VISISTAT 35W (STAPLE) ×2 IMPLANT
SUT ETHILON 2 0 PSLX (SUTURE) ×4 IMPLANT
SUT ETHILON 3 0 PS 1 (SUTURE) IMPLANT
SUT SILK 2 0 SH (SUTURE) ×2 IMPLANT
SUT SILK 2 0SH CR/8 30 (SUTURE) ×2 IMPLANT
SUT VIC AB 2-0 CT1 27 (SUTURE) ×2
SUT VIC AB 2-0 CT1 TAPERPNT 27 (SUTURE) ×2 IMPLANT
SUT VIC AB 3-0 SH 18 (SUTURE) ×2 IMPLANT
SUT VIC AB 3-0 SH 27 (SUTURE) ×1
SUT VIC AB 3-0 SH 27X BRD (SUTURE) ×1 IMPLANT
SYR CONTROL 10ML LL (SYRINGE) IMPLANT
TOWEL GREEN STERILE (TOWEL DISPOSABLE) ×4 IMPLANT
TOWEL GREEN STERILE FF (TOWEL DISPOSABLE) ×2 IMPLANT
UNDERPAD 30X36 HEAVY ABSORB (UNDERPADS AND DIAPERS) ×2 IMPLANT
WATER STERILE IRR 1000ML POUR (IV SOLUTION) ×2 IMPLANT

## 2020-02-24 NOTE — Anesthesia Procedure Notes (Signed)
Procedure Name: MAC Date/Time: 02/24/2020 12:38 PM Performed by: Kyung Rudd, CRNA Pre-anesthesia Checklist: Patient identified, Emergency Drugs available, Suction available and Patient being monitored Patient Re-evaluated:Patient Re-evaluated prior to induction Oxygen Delivery Method: Simple face mask Preoxygenation: Pre-oxygenation with 100% oxygen Induction Type: IV induction Placement Confirmation: positive ETCO2 and breath sounds checked- equal and bilateral

## 2020-02-24 NOTE — Transfer of Care (Signed)
Immediate Anesthesia Transfer of Care Note  Patient: Christy Zhang  Procedure(s) Performed: TRANSMETATARSAL AMPUTATION LEFT (Left Toe)  Patient Location: PACU  Anesthesia Type:MAC combined with regional for post-op pain  Level of Consciousness: awake and patient cooperative  Airway & Oxygen Therapy: Patient Spontanous Breathing  Post-op Assessment: Report given to RN and Post -op Vital signs reviewed and stable  Post vital signs: Reviewed and stable  Last Vitals:  Vitals Value Taken Time  BP 110/48 02/24/20 1359  Temp 37.2 C 02/24/20 1355  Pulse 80 02/24/20 1401  Resp 17 02/24/20 1401  SpO2 97 % 02/24/20 1401  Vitals shown include unvalidated device data.  Last Pain:  Vitals:   02/24/20 1355  TempSrc:   PainSc: 0-No pain         Complications: No complications documented.

## 2020-02-24 NOTE — Progress Notes (Signed)
POD#1 s/p anterior tibial angioplasty for CLI of LLE.  Good result from angioplasty with biphasic signal in DP today.  Gangrene of the left forefoot will require TMA, which I have offered to the patient.  I explained risks / benefits / alternatives to the patient.  I explained that I think her chance of healing the TMA is not excellent, but is preferred to committing to a major amputation for her ongoing function and independence.  She is understanding and wishes to proceed.  Yevonne Aline. Stanford Breed, MD 02/24/2020

## 2020-02-24 NOTE — Progress Notes (Signed)
   VASCULAR SURGERY ASSESSMENT & PLAN:   PERIPHERAL VASCULAR DISEASE WITH DRY GANGRENE OF THE LEFT FOREFOOT: She underwent successful revascularization of the left lower extremity yesterday by Dr. Carlis Abbott. She had angioplasty of the anterior tibial artery which is her dominant runoff. She has a brisk anterior tibial signal with a Doppler and a reasonable dorsalis pedis signal. There are really no further options for revascularization. She is scheduled for transmetatarsal amputation today I believe.   SUBJECTIVE:   No complaints this morning.  PHYSICAL EXAM:   Vitals:   02/23/20 2034 02/23/20 2312 02/24/20 0400 02/24/20 0807  BP: 131/60 (!) 137/50 135/60 (!) 141/53  Pulse: 85 83 74 78  Resp: 19 16 18 16   Temp:  98.3 F (36.8 C) 98.3 F (36.8 C) 98.6 F (37 C)  TempSrc:  Oral Oral Oral  SpO2: 100% 100% 100% 98%  Weight:      Height:       No hematoma right groin Brisk anterior tibial and dorsalis pedis signal with the Doppler left foot.  LABS:   Lab Results  Component Value Date   WBC 17.1 (H) 02/23/2020   HGB 7.6 (L) 02/23/2020   HCT 25.2 (L) 02/23/2020   MCV 84.3 02/23/2020   PLT 592 (H) 02/23/2020   Lab Results  Component Value Date   CREATININE 7.56 (H) 02/23/2020   No results found for: INR, PROTIME CBG (last 3)  Recent Labs    02/23/20 2206 02/24/20 0603 02/24/20 0804  GLUCAP 139* 125* 114*    PROBLEM LIST:    Active Problems:   Diabetes ([place])   Anemia in chronic kidney disease   Essential (primary) hypertension   Osteomyelitis of third toe of right foot (HCC)   Osteomyelitis (HCC)   Hyperglycemia due to diabetes mellitus ([place])   Gangrene (HCC)   CURRENT MEDS:   . amLODipine  10 mg Oral QHS  . aspirin EC  81 mg Oral Daily  . atorvastatin  40 mg Oral QHS  . Chlorhexidine Gluconate Cloth  6 each Topical Q0600  . clopidogrel  75 mg Oral QHS  . heparin  5,000 Units Subcutaneous Q12H  . insulin aspart  0-15 Units Subcutaneous TID WC  . insulin  aspart  0-5 Units Subcutaneous QHS  . insulin glargine  50 Units Subcutaneous Daily  . metoprolol succinate  100 mg Oral QHS  . multivitamin  1 tablet Oral QHS  . sevelamer carbonate  2,400 mg Oral TID WC   And  . sevelamer carbonate  1,600 mg Oral With snacks  . sodium chloride flush  3 mL Intravenous Q12H  . sodium chloride flush  3 mL Intravenous Q12H    Deitra Mayo Office: [phone] 02/24/2020

## 2020-02-24 NOTE — Anesthesia Postprocedure Evaluation (Signed)
Anesthesia Post Note  Patient: Christy Zhang  Procedure(s) Performed: TRANSMETATARSAL AMPUTATION LEFT (Left Toe)     Patient location during evaluation: PACU Anesthesia Type: Regional Level of consciousness: awake and alert Pain management: pain level controlled Vital Signs Assessment: post-procedure vital signs reviewed and stable Respiratory status: spontaneous breathing Cardiovascular status: stable Anesthetic complications: no   No complications documented.  Last Vitals:  Vitals:   02/24/20 1425 02/24/20 1722  BP: (!) 126/48 (!) 110/49  Pulse: 89 67  Resp: 20 20  Temp: 36.9 C 36.7 C  SpO2: 96% 96%    Last Pain:  Vitals:   02/24/20 1722  TempSrc: Oral  PainSc:                  [name]

## 2020-02-24 NOTE — Progress Notes (Signed)
PROGRESS NOTE    Christy Zhang  DXI:[id number] DOB: 1955/10/20 DOA: 02/22/2020 PCP: Claretta Fraise, MD    Chief Complaint  Patient presents with  . Wound Check    Brief Narrative:  Christy Zhang is a 64 y.o. 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  Patient developed right third toe gangrene-like changes in summer, when patient was admitted to hospital for groin infection.  Patient was treated with Tressie Ellis for both infections, and work-up by vascular surgery showed bilateral third toe gangrene changes angiogram showed on July 22 that severe right tibial single-vessel runoff at mid segment with occlusion just above the ankle, on the left side, there is a severe tibial disease in the AT and peroneal artery heavily calcified with multiple segment high-grade stenosis with increased risk of limb loss. Patient claimed that she has not been following with either vascular or podiatrist since July.  But she has been following with wound care center for the right groin wound.  Patient started noticed more discoloration changes of the right 2nd and 4th toe 3 to 4 days ago, denied any pain, and gradually onward she started to notice rash and warmness of the right leg below knee.   patient went to wound care center for wound care of the right groin, where staff noticed extensive changes of the left foot concerning for acute infection and sent patient down to ER.  Upon arrival to ED, patient had increased leukocytosis of 17 which seems stable compared to 17.5 on last admission, x-ray showed left foot negative finding regarding acute bony changes.  Right 3rd toe showed signs of osteomyelitis.  Vascular surgery was consulted.  Patient was found to have a left third toe gangrene, underwent angiogram, showing severe tibial  artery occlusive disease bilaterally, underwent right anterior tibial artery angioplasty.   Assessment & Plan:   Active Problems:   Diabetes ([place])   Anemia in chronic kidney disease   Essential (primary) hypertension   Osteomyelitis of third toe of right foot (HCC)   Osteomyelitis (HCC)   Hyperglycemia due to diabetes mellitus ([place])   Gangrene (HCC)  Gangrene of the left foot/severe peripheral vascular disease X-rays do not show any osteomyelitis at this time. Patient has severe tibial artery occlusive disease bilaterally based on her previous arteriogram.  She underwent lower extremity arteriogram and angioplasty of the left anterior tibial artery on 02/23/2020 and was also found to have occlusion of the right common femoral artery.  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  She remains on DAPT.  Essential hypertension Blood pressure controlled.  Continue increased dose of Norvasc 10 mg and Toprol-XL 100 mg p.o. daily.  Anemia of chronic disease secondary to diabetes and end-stage renal disease. Transfuse to keep hemoglobin greater than 7.  Insulin-dependent diabetes mellitus CBG (last 3)  Recent Labs    02/23/20 2206 02/24/20 0603 02/24/20 0804  GLUCAP 139* 125* 114*   Slightly better.  Increased Lantus to 50 units this morning.  Continue SSI.  End-stage renal disease on dialysis. Nephrology on board.  Dialysis per schedule.  Leukocytosis/thrombocytosis probably secondary to gangrene  DVT prophylaxis: Heparin Code Status: Full code Family Communication: None at bedside.  Plan of care discussed with patient.  She is alert and oriented and has capacity to make decisions.  disposition:   Status is: Inpatient  Remains inpatient  appropriate because:Ongoing diagnostic testing needed not appropriate for outpatient work up, Unsafe d/c plan and IV treatments appropriate due to intensity of illness or inability to take PO   Dispo: The patient is from: Home              Anticipated  d/c is to: Pending will likely require SNF.              Anticipated d/c date is: > 3 days              Patient currently is not medically stable to d/c.       Consultants:   Podiatry Dr. Earleen Newport  Vascular surgery  Nephrology   Procedures:   HD    Antimicrobials:  Antibiotics Given (last 72 hours)    Date/Time Action Medication Dose Rate   02/22/20 1733 New Bag/Given   piperacillin-tazobactam (ZOSYN) IVPB 2.25 g 2.25 g 100 mL/hr   02/22/20 1945 New Bag/Given   vancomycin (VANCOCIN) 2,500 mg in sodium chloride 0.9 % 500 mL IVPB 2,500 mg 250 mL/hr   02/23/20 0023 New Bag/Given   piperacillin-tazobactam (ZOSYN) IVPB 2.25 g 2.25 g 100 mL/hr   02/23/20 1002 New Bag/Given   vancomycin (VANCOCIN) IVPB 1000 mg/200 mL premix 1,000 mg 200 mL/hr   02/23/20 1232 New Bag/Given   piperacillin-tazobactam (ZOSYN) IVPB 2.25 g 2.25 g 100 mL/hr   02/24/20 0115 New Bag/Given   piperacillin-tazobactam (ZOSYN) IVPB 2.25 g 2.25 g 100 mL/hr   02/24/20 0902 New Bag/Given   piperacillin-tazobactam (ZOSYN) IVPB 2.25 g 2.25 g 100 mL/hr       Subjective: Seen and examined before the surgery.  She had no complaint.  Objective: Vitals:   02/23/20 2034 02/23/20 2312 02/24/20 0400 02/24/20 0807  BP: 131/60 (!) 137/50 135/60 (!) 141/53  Pulse: 85 83 74 78  Resp: 19 16 18 16   Temp:  98.3 F (36.8 C) 98.3 F (36.8 C) 98.6 F (37 C)  TempSrc:  Oral Oral Oral  SpO2: 100% 100% 100% 98%  Weight:      Height:        Intake/Output Summary (Last 24 hours) at 02/24/2020 0938 Last data filed at 02/24/2020 0263 Gross per 24 hour  Intake 56 ml  Output 2000 ml  Net -1944 ml   Filed Weights   02/23/20 0730 02/23/20 0753 02/23/20 1138  Weight: 106.4 kg 106.4 kg 104.9 kg    Examination:  General exam: Appears calm and comfortable  Respiratory system: Clear to auscultation. Respiratory effort normal. Cardiovascular system: S1 & S2 heard, RRR. No JVD, murmurs, rubs, gallops or clicks. No  pedal edema. Gastrointestinal system: Abdomen is nondistended, soft and nontender. No organomegaly or masses felt. Normal bowel sounds heard. Central nervous system: Alert and oriented. No focal neurological deficits. Extremities: Gangrenous left second and fourth toe and medial side of the great toe. Skin: No rashes, lesions or ulcers.  Psychiatry: Judgement and insight appear normal. Mood & affect appropriate.    Data Reviewed: I have personally reviewed following labs and imaging studies  CBC: Recent Labs  Lab 02/22/20 1152 02/23/20 0049  WBC 17.2* 17.1*  NEUTROABS 12.8*  --   HGB 8.2* 7.6*  HCT 27.3* 25.2*  MCV 85.0 84.3  PLT 640* 592*    Basic Metabolic Panel: Recent Labs  Lab 02/22/20 1152 02/23/20 0049  NA 137 137  K 3.7 3.8  CL 99 101  CO2 26 24  GLUCOSE 79 99  BUN 18 22  CREATININE 6.76* 7.56*  CALCIUM 8.4* 8.4*    GFR: Estimated Creatinine Clearance: 9.1 mL/min (A) (by C-G formula based on SCr of 7.56 mg/dL (H)).  Liver Function Tests: Recent Labs  Lab 02/22/20 1152  AST 9*  ALT 8  ALKPHOS 58  BILITOT 0.5  PROT 7.2  ALBUMIN 2.4*    CBG: Recent Labs  Lab 02/23/20 1237 02/23/20 1813 02/23/20 2206 02/24/20 0603 02/24/20 0804  GLUCAP 72 79 139* 125* 114*     Recent Results (from the past 240 hour(s))  Blood culture (routine x 2)     Status: None (Preliminary result)   Collection Time: 02/22/20  1:00 PM   Specimen: BLOOD RIGHT ARM  Result Value Ref Range Status   Specimen Description BLOOD RIGHT ARM  Final   Special Requests   Final    BOTTLES DRAWN AEROBIC AND ANAEROBIC Blood Culture results may not be optimal due to an inadequate volume of blood received in culture bottles   Culture   Final    NO GROWTH 2 DAYS Performed at [facility], [place] [address] [place], [place] [address]    Report Status PENDING  Incomplete  Respiratory Panel by RT PCR (Flu A&B, Covid) - Nasopharyngeal Swab     Status: None   Collection Time: 02/22/20   3:37 PM   Specimen: Nasopharyngeal Swab  Result Value Ref Range Status   SARS Coronavirus 2 by RT PCR NEGATIVE NEGATIVE Final    Comment: (NOTE) SARS-CoV-2 target nucleic acids are NOT DETECTED.  The SARS-CoV-2 RNA is generally detectable in upper respiratoy specimens during the acute phase of infection. The lowest concentration of SARS-CoV-2 viral copies this assay can detect is 131 copies/mL. A negative result does not preclude SARS-Cov-2 infection and should not be used as the sole basis for treatment or other patient management decisions. A negative result may occur with  improper specimen collection/handling, submission of specimen other than nasopharyngeal swab, presence of viral mutation(s) within the areas targeted by this assay, and inadequate number of viral copies (<131 copies/mL). A negative result must be combined with clinical observations, patient history, and epidemiological information. The expected result is Negative.  Fact Sheet for Patients:  PinkCheek.be  Fact Sheet for Healthcare Providers:  GravelBags.it  This test is no t yet approved or cleared by the [place] FDA and  has been authorized for detection and/or diagnosis of SARS-CoV-2 by FDA under an Emergency Use Authorization (EUA). This EUA will remain  in effect (meaning this test can be used) for the duration of the COVID-19 declaration under Section 564(b)(1) of the Act, 21 U.S.C. section 360bbb-3(b)(1), unless the authorization is terminated or revoked sooner.     Influenza A by PCR NEGATIVE NEGATIVE Final   Influenza B by PCR NEGATIVE NEGATIVE Final    Comment: (NOTE) The Xpert Xpress SARS-CoV-2/FLU/RSV assay is intended as an aid in  the diagnosis of influenza from Nasopharyngeal swab specimens and  should not be used as a sole basis for treatment. Nasal washings and  aspirates are unacceptable for Xpert Xpress SARS-CoV-2/FLU/RSV    testing.  Fact Sheet for Patients: PinkCheek.be  Fact Sheet for Healthcare Providers: GravelBags.it  This test is not yet approved or cleared by the [place] FDA and  has been authorized for detection and/or diagnosis of SARS-CoV-2 by  FDA under an Emergency Use Authorization (EUA). This EUA will remain  in effect (meaning this test can be used) for the duration of the  Covid-19 declaration under Section 564(b)(1) of the  Act, 21  U.S.C. section 360bbb-3(b)(1), unless the authorization is  terminated or revoked. Performed at [facility], [place] [address] [place], [place] [address]          Radiology Studies: PERIPHERAL VASCULAR CATHETERIZATION  Result Date: 02/23/2020 Patient name: Christy Zhang         MRN: [id number]        DOB: [birth year]          Sex: female  02/22/2020 - 02/23/2020 Pre-operative Diagnosis: Critical limb ischemia of the bilateral lower extremities with tissue loss Post-operative diagnosis:  Same Surgeon:  [name], MD Assistant:  Yevonne Aline. Stanford Breed, MD Procedure Performed: 1.  Ultrasound guided access of right common femoral artery 2.  Aortogram including catheter selection of aorta 3.  Bilateral lower extremity arteriogram with selection of third order branches in the left lower extremity 4.  Left anterior tibial artery angioplasty (2.5 mm x 220 mm Coyote) 5.  Mynx closure of the right common femoral artery 6.  67 minutes of monitored moderate conscious sedation time  Indications: Patient is a 64 year old female well-known to the vascular surgery service who has previously undergone a right leg tibial intervention for critical limb ischemia.  She was seen yesterday in consultation by Dr. Scot Dock with gangrene of her left toes as well as gangrene of a right toe as well.  She presents today for planned lower extremity arteriogram and possible intervention with a focus on the left leg.   Findings:  Aortogram showed no flow-limiting stenosis in the aortoiliac segment.  Left lower extremity arteriogram showed a patent common femoral, profunda, SFA, above and below-knee popliteal artery with no flow limiting stenosis.  Patient has severe tibial disease.  Dominant runoff appears to be the anterior tibial that had multifocal high-grade >80% stenosis throughout a long segment over 200 mm.  The peroneal was also patent although was very small and diminutive and had some high-grade stenosis proximally.  The posterior tibial occluded shortly after takeoff from the TP trunk.  The left anterior tibial artery disease was crossed antegrade and the entire anterior tibial was angioplastied with a 2.5 mm Coyote.  No evidence of residual stenosis and no dissection.  She now has inline flow down the left lower extremity with a patent AT with flow into the foot.  Right lower extremity runoff  was obtained at the end of the case and this shows a patent common femoral profunda SFA popliteal and inline flow through the anterior tibial although it does have diffuse moderate disease.             Procedure:  The patient was identified in the holding area and taken to room 8.  The patient was then placed supine on the table and prepped and draped in the usual sterile fashion.  A time out was called.  Ultrasound was used to evaluate the right common femoral artery.  It was patent .  A digital ultrasound image was acquired.  A micropuncture needle was used to access the right common femoral artery under ultrasound guidance.  An 018 wire was advanced without resistance and a micropuncture sheath was placed.  The 018 wire was removed and a benson wire was placed.  The micropuncture sheath was exchanged for a 5 french sheath.  An omniflush catheter was advanced over the wire to the level of L-1.  An abdominal angiogram was obtained.  Next, using the omniflush catheter and a benson wire, the aortic bifurcation was crossed and  the catheter was placed into theleft external iliac artery and left runoff was obtained.  Only decision was made to intervene.  Ultimately the aortic bifurcation was crossed with a Omni Flush catheter and Glidewire advantage.  Once the wire was advanced into the SFA a long 6 French 90 cm shuttle sheath was placed.  Patient was given 100 units per kilogram heparin.  The anterior tibial artery was then cannulated with an 0.014 Zilient wire and the lesion was crossed all the way into the ankle and confirmed on hand-injection that we were in the true lumen.  The entire AT was then angioplastied with a 2.5 mm Coyote throughout its entire length for 2 minutes for a prolonged inflation time.  No evidence of residual stenosis or dissection with inline flow down the left lower extremity through the anterior tibial into the foot.  That point in time wires and catheters were removed.  Retrograde runoff in the right groin sheath was then obtained to evaluate the right leg and the anterior tibial intervention remains patent although diffusely diseased.  Mynx closure was deployed in the right groin.  Taken to holding in stable condition.  Plan: Patient will continue dual antiplatelet therapy.  Should will be posted for left TMA tomorrow.  [name], MD [facility] [place] Office: [phone]  DG Foot Complete Left  Result Date: 02/22/2020 CLINICAL DATA:  Infection EXAM: LEFT FOOT - COMPLETE 3+ VIEW COMPARISON:  None. FINDINGS: Decreased osseous mineralization. No erosive changes identified. Distal phalanges are not as well evaluated due to flexion and overlap. 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 Extensive vascular calcification. IMPRESSION: No radiographic evidence of osteomyelitis. Distal phalanges are not as well evaluated. Electronically Signed   By: Macy Mis M.D.   On: 02/22/2020 12:31   DG Foot Complete Right  Result Date: 02/22/2020 CLINICAL DATA:  Evaluate for  osteomyelitis EXAM: RIGHT FOOT COMPLETE - 3+ VIEW COMPARISON:  None. FINDINGS: Amputation of the second distal phalanx. Attenuation of the distal half of the third distal phalanx which may reflect prior partial amputation versus osteomyelitis. No acute fracture or dislocation. Old healed fracture of the base of the fifth metatarsal. Generalized osteopenia. Severe arthropathy and malalignment of the midfoot as can be seen with a Charcot joint. No aggressive osseous lesion. Soft tissue are unremarkable. No radiopaque foreign body or soft tissue emphysema. Peripheral vascular atherosclerotic disease. IMPRESSION: 1. Amputation of the second distal phalanx. 2. Attenuation of the distal half of the third distal phalanx which may reflect prior partial amputation versus osteomyelitis. Electronically Signed   By: Kathreen Devoid   On: 02/22/2020 13:42   Scheduled Meds: . amLODipine  10 mg Oral QHS  . aspirin EC  81 mg Oral Daily  . atorvastatin  40 mg Oral QHS  . Chlorhexidine Gluconate Cloth  6 each Topical Q0600  . clopidogrel  75 mg Oral QHS  . heparin  5,000 Units Subcutaneous Q12H  . insulin aspart  0-15 Units Subcutaneous TID WC  . insulin aspart  0-5 Units Subcutaneous QHS  . insulin glargine  50 Units Subcutaneous Daily  . metoprolol succinate  100 mg Oral QHS  . multivitamin  1 tablet Oral QHS  . sevelamer carbonate  2,400 mg Oral TID WC   And  . sevelamer carbonate  1,600 mg Oral With snacks  . sodium chloride flush  3 mL Intravenous Q12H  . sodium chloride flush  3 mL Intravenous Q12H   Continuous Infusions: . sodium chloride    .  piperacillin-tazobactam (ZOSYN)  IV 2.25 g (02/24/20 0902)  . vancomycin Stopped (02/23/20 1205)     LOS: 2 days   Darliss Cheney, MD [facility]  To contact the attending provider between 7A-7P or the covering provider during after hours 7P-7A, please log into the web site [email or web address] and access using universal [place] password for that web site.  If you do not have the password, please call the hospital operator.  02/24/2020, 9:38 AM

## 2020-02-24 NOTE — Plan of Care (Signed)

## 2020-02-24 NOTE — Progress Notes (Signed)
[name]   OP HD:MWF [facility] 4h per pt, edw 105.5kg ?   Assessment/ Plan:   1. L foot infection /cellulitis/ gangrene - started IV abx vanc/ zosyn, VVS successful revasc by Dr. Carlis Abbott on 10/27 and lt TMA 10/28 2. ESRD - on HD MWF. HD Fri.  3. BP / volume - cont home BP meds, no gross vol excess.  4. Anemia ckd - Hb 8 5. MBD ckd - cont binder; recheck phos in the AM (only 2.5 on 9/2) 6. DM2 - on insulin  Subjective:   Denies f/c/n/v/ CP/ dyspnea   Objective:   BP (!) 126/48 (BP Location: Left Arm)   Pulse 89   Temp 98.4 F (36.9 C) (Oral)   Resp 20   Ht 5\' 4"  (1.626 m)   Wt 104.9 kg   LMP  (LMP Unknown)   SpO2 96%   BMI 39.70 kg/m   Intake/Output Summary (Last 24 hours) at 02/24/2020 1511 Last data filed at 02/24/2020 1348 Gross per 24 hour  Intake 406 ml  Output 50 ml  Net 356 ml   Weight change: -0.2 kg  Physical Exam: Genalert, no distress No jvd or bruits Chest clear bilat RRR no MRG Abd soft ntnd no mass or ascites +bsobese Ext  L TMA > R toe dry gangrene Neuro is alert, Ox 3 , nf RUA AVG+bruit  Imaging: PERIPHERAL VASCULAR CATHETERIZATION  Result Date: 02/23/2020 Patient name: Christy Zhang         MRN: [id number]        DOB: Aug 30, 1955          Sex: female  02/22/2020 - 02/23/2020 Pre-operative Diagnosis: Critical limb ischemia of the bilateral lower extremities with tissue loss Post-operative diagnosis:  Same Surgeon:  [name], MD Assistant:  Yevonne Aline. Stanford Breed, MD Procedure Performed: 1.  Ultrasound guided access of right common femoral artery 2.  Aortogram including catheter selection of aorta 3.  Bilateral lower extremity arteriogram with selection of third order branches in the left lower extremity 4.  Left anterior tibial artery angioplasty (2.5 mm x 220 mm Coyote) 5.  Mynx closure of the right common femoral artery 6.  67 minutes of monitored moderate conscious sedation time  Indications:  Patient is a 64 year old female well-known to the vascular surgery service who has previously undergone a right leg tibial intervention for critical limb ischemia.  She was seen yesterday in consultation by Dr. Scot Dock with gangrene of her left toes as well as gangrene of a right toe as well.  She presents today for planned lower extremity arteriogram and possible intervention with a focus on the left leg.  Findings:  Aortogram showed no flow-limiting stenosis in the aortoiliac segment.  Left lower extremity arteriogram showed a patent common femoral, profunda, SFA, above and below-knee popliteal artery with no flow limiting stenosis.  Patient has severe tibial disease.  Dominant runoff appears to be the anterior tibial that had multifocal high-grade >80% stenosis throughout a long segment over 200 mm.  The peroneal was also patent although was very small and diminutive and had some high-grade stenosis proximally.  The posterior tibial occluded shortly after takeoff from the TP trunk.  The left anterior tibial artery disease was crossed antegrade and the entire anterior tibial was angioplastied with a 2.5 mm Coyote.  No evidence of residual stenosis and no dissection.  She now has inline flow down the left lower extremity with a patent AT with flow into the  foot.  Right lower extremity runoff  was obtained at the end of the case and this shows a patent common femoral profunda SFA popliteal and inline flow through the anterior tibial although it does have diffuse moderate disease.             Procedure:  The patient was identified in the holding area and taken to room 8.  The patient was then placed supine on the table and prepped and draped in the usual sterile fashion.  A time out was called.  Ultrasound was used to evaluate the right common femoral artery.  It was patent .  A digital ultrasound image was acquired.  A micropuncture needle was used to access the right common femoral artery under ultrasound  guidance.  An 018 wire was advanced without resistance and a micropuncture sheath was placed.  The 018 wire was removed and a benson wire was placed.  The micropuncture sheath was exchanged for a 5 french sheath.  An omniflush catheter was advanced over the wire to the level of L-1.  An abdominal angiogram was obtained.  Next, using the omniflush catheter and a benson wire, the aortic bifurcation was crossed and the catheter was placed into theleft external iliac artery and left runoff was obtained.  Only decision was made to intervene.  Ultimately the aortic bifurcation was crossed with a Omni Flush catheter and Glidewire advantage.  Once the wire was advanced into the SFA a long 6 French 90 cm shuttle sheath was placed.  Patient was given 100 units per kilogram heparin.  The anterior tibial artery was then cannulated with an 0.014 Zilient wire and the lesion was crossed all the way into the ankle and confirmed on hand-injection that we were in the true lumen.  The entire AT was then angioplastied with a 2.5 mm Coyote throughout its entire length for 2 minutes for a prolonged inflation time.  No evidence of residual stenosis or dissection with inline flow down the left lower extremity through the anterior tibial into the foot.  That point in time wires and catheters were removed.  Retrograde runoff in the right groin sheath was then obtained to evaluate the right leg and the anterior tibial intervention remains patent although diffusely diseased.  Mynx closure was deployed in the right groin.  Taken to holding in stable condition.  Plan: Patient will continue dual antiplatelet therapy.  Should will be posted for left TMA tomorrow.  [name], MD [facility] Office: [phone]   Labs: BMET Recent Labs  Lab 02/22/20 1152 02/23/20 0049  NA 137 137  K 3.7 3.8  CL 99 101  CO2 26 24  GLUCOSE 79 99  BUN 18 22  CREATININE 6.76* 7.56*  CALCIUM 8.4* 8.4*    CBC Recent Labs  Lab 02/22/20 1152 02/23/20 0049  WBC 17.2* 17.1*  NEUTROABS 12.8*  --   HGB 8.2* 7.6*  HCT 27.3* 25.2*  MCV 85.0 84.3  PLT 640* 592*    Medications:    . amLODipine  10 mg Oral QHS  . aspirin EC  81 mg Oral Daily  . atorvastatin  40 mg Oral QHS  . Chlorhexidine Gluconate Cloth  6 each Topical Q0600  . clopidogrel  75 mg Oral QHS  . heparin  5,000 Units Subcutaneous Q12H  . insulin aspart  0-15 Units Subcutaneous TID WC  . insulin aspart  0-5 Units Subcutaneous QHS  . insulin glargine  50 Units Subcutaneous Daily  .  metoprolol succinate  100 mg Oral QHS  . multivitamin  1 tablet Oral QHS  . sevelamer carbonate  2,400 mg Oral TID WC   And  . sevelamer carbonate  1,600 mg Oral With snacks  . sodium chloride flush  3 mL Intravenous Q12H  . sodium chloride flush  3 mL Intravenous Q12H      Christy Santee, MD 02/24/2020, 3:11 PM

## 2020-02-24 NOTE — Brief Op Note (Addendum)
02/24/2020  1:57 PM  PATIENT:  Christy Zhang  64 y.o. 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  POST-OPERATIVE DIAGNOSIS:  Same.  PROCEDURE:  Procedure(s): TRANSMETATARSAL AMPUTATION LEFT (Left)  SURGEON:  Surgeon(s) and Role:    * Cherre Robins, MD - Primary  PHYSICIAN ASSISTANT:   Roxy Horseman, PA-C  ASSISTANTS: as above   ANESTHESIA:   regional and IV sedation  EBL:  50 mL   BLOOD ADMINISTERED:none  DRAINS: none   LOCAL MEDICATIONS USED:  NONE  SPECIMEN:  No Specimen  DISPOSITION OF SPECIMEN:  N/A  COUNTS:  YES  TOURNIQUET:  * No tourniquets in log *  DICTATION: .Dragon Dictation  PLAN OF CARE: return to IM service. IV analgesia x 24 hours. NWB to LLE x 24 hours. PT/OT/OOB POD#1. Dressing down POD#2. OK for weight bearing with heel shoe on POD#2 as tolerated.  PATIENT DISPOSITION:  PACU - hemodynamically stable.   Delay start of Pharmacological VTE agent (>24hrs) due to surgical blood loss or risk of bleeding: no  Yevonne Aline. Stanford Breed, MD 02/24/2020

## 2020-02-24 NOTE — Anesthesia Procedure Notes (Signed)
Anesthesia Regional Block: Popliteal block   Pre-Anesthetic Checklist: ,, timeout performed, Correct Patient, Correct Site, Correct Laterality, Correct Procedure, Correct Position, site marked, Risks and benefits discussed,  Surgical consent,  Pre-op evaluation,  At surgeon's request and post-op pain management  Laterality: Left  Prep: chloraprep       Needles:  Injection technique: Single-shot  Needle Type: Stimiplex     Needle Length: 10cm  Needle Gauge: 21     Additional Needles:   Procedures:,,,, ultrasound used (permanent image in chart),,,,  Motor weakness within 5 minutes.  Narrative:  Start time: 02/24/2020 11:45 AM End time: 02/24/2020 11:50 AM Injection made incrementally with aspirations every 5 mL.  Performed by: Personally  Anesthesiologist: [name], MD  Additional Notes: Nerve located and needle positioned with direct ultrasound guidance. Good perineural spread. Patient tolerated well.

## 2020-02-24 NOTE — Op Note (Signed)
02/24/2020  1:57 PM  PATIENT:  Christy Zhang  64 y.o. 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  POST-OPERATIVE DIAGNOSIS:  Same.  PROCEDURE:  Procedure(s): TRANSMETATARSAL AMPUTATION LEFT (Left)  SURGEON:  Surgeon(s) and Role:    * Cherre Robins, MD - Primary  PHYSICIAN ASSISTANT:   Roxy Horseman, PA-C  ASSISTANTS: as above   ANESTHESIA:   regional and IV sedation  EBL:  50 mL   BLOOD ADMINISTERED:none  DRAINS: none   LOCAL MEDICATIONS USED:  NONE  SPECIMEN:  No Specimen  DISPOSITION OF SPECIMEN:  N/A  COUNTS:  YES  TOURNIQUET:  * No tourniquets in log *  DICTATION: .Dragon Dictation  PLAN OF CARE: return to IM service. IV analgesia x 24 hours. NWB to LLE x 24 hours. PT/OT/OOB POD#1. Dressing down POD#2. OK for weight bearing with heel shoe on POD#2 as tolerated.  PATIENT DISPOSITION:  PACU - hemodynamically stable.   Delay start of Pharmacological VTE agent (>24hrs) due to surgical blood loss or risk of bleeding: no  INDICATIONS FOR PROCEDURE: Patient is a 64 year old female with atherosclerosis of the left lower extremity causing ischemic tissue loss of the left first through fourth toes.  She underwent successful endovascular revascularization 02/23/2020.  She was offered left transmetatarsal amputation to remove necrotic tissue and give her the best chance of ambulating in the near future.  I explained to her that her chance of healing at this level was lower than we would both hope, but this was her best chance of preserving her ankle and her functional status.  She understood and wished to proceed.  An assistant was needed for the case to assist in exposure and expedite the surgery.  PROCEDURE IN DETAIL: After preoperative discussion and the holding area where risks, benefits, alternatives were discussed, the patient was  identified as Christy Zhang and transferred to the operating room table.  Regional and MAC anesthesia was induced by the anesthesia team.  The left lower extremity was circumferentially prepped and draped in standard fashion.  Surgical pause was performed and the case was started.  A fishmouth incision was made with generous plantar flap to exclude all necrotic tissue incision was carried down sharply through flexor and extensor tendons of the foot until bone was encountered.  Reciprocating saw was used to transect the metatarsals just proximal to the metatarsal heads leaving generous flap coverage of the remainder of the foot.  Hemostasis was achieved with electrocautery and 3-0 silk suture.  The wound was copiously irrigated with saline.  The surgical bed was surprisingly healthy and we encountered a moderate amount of healthy bleeding tissue.  Wound was closed in layers using 2-0 Vicryl, 3-0 Vicryl, 3-0 nylon and surgical staples.  A loose bulky dressing was applied.  Patient tolerated the procedure very well.  All instrument and sharp counts were correct x2.  The patient was transferred to the PACU in good condition.  Christy Aline. Stanford Breed, MD 02/24/2020

## 2020-02-24 NOTE — Anesthesia Preprocedure Evaluation (Addendum)
Anesthesia Evaluation  Patient identified by MRN, date of birth, ID band Patient awake    Reviewed: Allergy & Precautions, NPO status , Patient's Chart, lab work & pertinent test results  History of Anesthesia Complications Negative for: history of anesthetic complications  Airway Mallampati: III  TM Distance: >3 FB Neck ROM: Full    Dental  (+) Dental Advisory Given, Poor Dentition, Upper Dentures, Missing   Pulmonary asthma , former smoker,    breath sounds clear to auscultation       Cardiovascular hypertension, Pt. on medications and Pt. on home beta blockers (-) angina+CHF  (-) Past MI  Rhythm:Regular Rate:Normal     Neuro/Psych  Neuromuscular disease negative psych ROS   GI/Hepatic negative GI ROS, Neg liver ROS,   Endo/Other  diabetes, Insulin DependentMorbid obesity  Renal/GU ESRF and DialysisRenal diseaseHD yesterday     Musculoskeletal  (+) Arthritis ,   Abdominal (+) + obese,   Peds  Hematology  (+) Blood dyscrasia, anemia ,   Anesthesia Other Findings   Reproductive/Obstetrics                            Anesthesia Physical  Anesthesia Plan  ASA: III  Anesthesia Plan: Regional   Post-op Pain Management:    Induction: Intravenous  PONV Risk Score and Plan: 2 and Ondansetron, Dexamethasone, Midazolam and Treatment may vary due to age or medical condition  Airway Management Planned: Natural Airway  Additional Equipment: None  Intra-op Plan:   Post-operative Plan:   Informed Consent: I have reviewed the patients History and Physical, chart, labs and discussed the procedure including the risks, benefits and alternatives for the proposed anesthesia with the patient or authorized representative who has indicated his/her understanding and acceptance.     Dental advisory given  Plan Discussed with: CRNA  Anesthesia Plan Comments:       Anesthesia Quick  Evaluation

## 2020-02-24 NOTE — Progress Notes (Signed)
Pt arrived back to 4e from PACU. The compression wrap on pt's left foot is clean, dry, and intact. Vitals stable. Telemetry placed. Pt denies pain.

## 2020-02-25 ENCOUNTER — Encounter (HOSPITAL_COMMUNITY): Payer: Self-pay | Admitting: Vascular Surgery

## 2020-02-25 DIAGNOSIS — I96 Gangrene, not elsewhere classified: Secondary | ICD-10-CM | POA: Diagnosis not present

## 2020-02-25 LAB — ABO/RH: ABO/RH(D): B POS

## 2020-02-25 LAB — CBC WITH DIFFERENTIAL/PLATELET
Abs Immature Granulocytes: 0.2 10*3/uL — ABNORMAL HIGH (ref 0.00–0.07)
Basophils Absolute: 0.1 10*3/uL (ref 0.0–0.1)
Basophils Relative: 0 %
Eosinophils Absolute: 0.6 10*3/uL — ABNORMAL HIGH (ref 0.0–0.5)
Eosinophils Relative: 3 %
HCT: 20.5 % — ABNORMAL LOW (ref 36.0–46.0)
Hemoglobin: 6.2 g/dL — CL (ref 12.0–15.0)
Immature Granulocytes: 1 %
Lymphocytes Relative: 12 %
Lymphs Abs: 2.2 10*3/uL (ref 0.7–4.0)
MCH: 25.5 pg — ABNORMAL LOW (ref 26.0–34.0)
MCHC: 30.2 g/dL (ref 30.0–36.0)
MCV: 84.4 fL (ref 80.0–100.0)
Monocytes Absolute: 1.3 10*3/uL — ABNORMAL HIGH (ref 0.1–1.0)
Monocytes Relative: 7 %
Neutro Abs: 14.3 10*3/uL — ABNORMAL HIGH (ref 1.7–7.7)
Neutrophils Relative %: 77 %
Platelets: 564 10*3/uL — ABNORMAL HIGH (ref 150–400)
RBC: 2.43 MIL/uL — ABNORMAL LOW (ref 3.87–5.11)
RDW: 15.9 % — ABNORMAL HIGH (ref 11.5–15.5)
WBC: 18.7 10*3/uL — ABNORMAL HIGH (ref 4.0–10.5)
nRBC: 0 % (ref 0.0–0.2)

## 2020-02-25 LAB — BASIC METABOLIC PANEL
Anion gap: 12 (ref 5–15)
BUN: 23 mg/dL (ref 8–23)
CO2: 27 mmol/L (ref 22–32)
Calcium: 8 mg/dL — ABNORMAL LOW (ref 8.9–10.3)
Chloride: 96 mmol/L — ABNORMAL LOW (ref 98–111)
Creatinine, Ser: 7.46 mg/dL — ABNORMAL HIGH (ref 0.44–1.00)
GFR, Estimated: 6 mL/min — ABNORMAL LOW (ref >60)
Glucose, Bld: 136 mg/dL — ABNORMAL HIGH (ref 70–99)
Potassium: 4.2 mmol/L (ref 3.5–5.1)
Sodium: 135 mmol/L (ref 135–145)

## 2020-02-25 LAB — HEMOGLOBIN AND HEMATOCRIT, BLOOD
HCT: 27.5 % — ABNORMAL LOW (ref 36.0–46.0)
Hemoglobin: 9.1 g/dL — ABNORMAL LOW (ref 12.0–15.0)

## 2020-02-25 LAB — PREPARE RBC (CROSSMATCH)

## 2020-02-25 LAB — GLUCOSE, CAPILLARY
Glucose-Capillary: 149 mg/dL — ABNORMAL HIGH (ref 70–99)
Glucose-Capillary: 128 mg/dL — ABNORMAL HIGH (ref 70–99)

## 2020-02-25 LAB — PHOSPHORUS: Phosphorus: 5.1 mg/dL — ABNORMAL HIGH (ref 2.5–4.6)

## 2020-02-25 MED ORDER — ACETAMINOPHEN 325 MG PO TABS
ORAL_TABLET | ORAL | Status: AC
  Filled 2020-02-25: qty 2

## 2020-02-25 MED ORDER — DIPHENHYDRAMINE HCL 50 MG/ML IJ SOLN: 25.0000 mg | Freq: Once | INTRAMUSCULAR | Status: DC

## 2020-02-25 MED ORDER — DIPHENHYDRAMINE HCL 25 MG PO CAPS
ORAL_CAPSULE | ORAL | Status: AC
  Filled 2020-02-25: qty 1

## 2020-02-25 MED ORDER — FUROSEMIDE 10 MG/ML IJ SOLN: 20.0000 mg | Freq: Once | INTRAMUSCULAR | Status: DC

## 2020-02-25 MED ORDER — ACETAMINOPHEN 325 MG PO TABS: 650.0000 mg | ORAL_TABLET | Freq: Once | ORAL | Status: DC

## 2020-02-25 MED ORDER — SODIUM CHLORIDE 0.9% IV SOLUTION: Freq: Once | INTRAVENOUS | Status: DC

## 2020-02-25 NOTE — Progress Notes (Signed)
PROGRESS NOTE    Christy Zhang  WCH:[id number] DOB: [birth year] DOA: 02/22/2020 PCP: Claretta Fraise, MD    Chief Complaint  Patient presents with  . Wound Check    Brief Narrative:  Christy Zhang is a 64 y.o. 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  Patient developed right third toe gangrene-like changes in summer, when patient was admitted to hospital for groin infection.  Patient was treated with Tressie Ellis for both infections, and work-up by vascular surgery showed bilateral third toe gangrene changes angiogram showed on July 22 that severe right tibial single-vessel runoff at mid segment with occlusion just above the ankle, on the left side, there is a severe tibial disease in the AT and peroneal artery heavily calcified with multiple segment high-grade stenosis with increased risk of limb loss. Patient claimed that she has not been following with either vascular or podiatrist since July.  But she has been following with wound care center for the right groin wound.  Patient started noticed more discoloration changes of the right 2nd and 4th toe 3 to 4 days ago, denied any pain, and gradually onward she started to notice rash and warmness of the right leg below knee.   patient went to wound care center for wound care of the right groin, where staff noticed extensive changes of the left foot concerning for acute infection and sent patient down to ER.  Upon arrival to ED, patient had increased leukocytosis of 17 which seems stable compared to 17.5 on last admission, x-ray showed left foot negative finding regarding acute bony changes.  Right 3rd toe showed signs of osteomyelitis.  Vascular surgery was consulted.  Patient was found to have a left third toe gangrene, underwent angiogram, showing severe tibial  artery occlusive disease bilaterally, underwent right anterior tibial artery angioplasty.   Assessment & Plan:   Active Problems:   Diabetes ([place])   Anemia in chronic kidney disease   Essential (primary) hypertension   Osteomyelitis of third toe of right foot (HCC)   Osteomyelitis (HCC)   Hyperglycemia due to diabetes mellitus ([place])   Gangrene (HCC)  Gangrene of the left foot/severe peripheral vascular disease X-rays do not show any osteomyelitis at this time. Patient has severe tibial artery occlusive disease bilaterally based on her previous arteriogram.  She underwent lower extremity arteriogram and angioplasty of the left anterior tibial artery on 02/23/2020 and was also found to have occlusion of the right common femoral artery.  Underwent left TMA on 02/24/2020 and then underwent a left transmetatarsal amputation on 02/24/2020.  She remains on DAPT.  Essential hypertension Blood pressure controlled.  Continue increased dose of Norvasc 10 mg and Toprol-XL 100 mg p.o. daily.  Anemia of chronic disease/acute blood loss anemia/postop anemia: Hemoglobin dropped to 6.2 today.  2 units PRBC transfusion were ordered by night physician.  Insulin-dependent diabetes mellitus CBG (last 3)  Recent Labs    02/24/20 1655 02/24/20 2201 02/25/20 0634  GLUCAP 172* 114* 149*   Improved.  Continue Lantus to 50 units this morning.  Continue SSI.  End-stage renal disease on dialysis. Nephrology on board.  Dialysis per schedule.  Leukocytosis/thrombocytosis probably secondary to gangrene  DVT prophylaxis: Heparin Code Status: Full code Family Communication: None at bedside.  Plan of care discussed with patient.  She is alert and oriented and has capacity  to make decisions.  disposition:   Status is: Inpatient  Remains inpatient appropriate because:Ongoing diagnostic testing needed not appropriate for outpatient work up, Unsafe d/c plan and IV treatments appropriate due to intensity of  illness or inability to take PO   Dispo: The patient is from: Home              Anticipated d/c is to: Pending will likely require SNF.              Anticipated d/c date is: 2-3 days              Patient currently is not medically stable to d/c.       Consultants:   Podiatry Dr. Earleen Newport  Vascular surgery  Nephrology   Procedures:   HD lower extremity arteriogram and angioplasty of the left anterior tibial artery on 02/23/2020 left TMA on 02/24/2020  eft transmetatarsal amputation on 02/24/2020    Antimicrobials:  Antibiotics Given (last 72 hours)    Date/Time Action Medication Dose Rate   02/22/20 1733 New Bag/Given   piperacillin-tazobactam (ZOSYN) IVPB 2.25 g 2.25 g 100 mL/hr   02/22/20 1945 New Bag/Given   vancomycin (VANCOCIN) 2,500 mg in sodium chloride 0.9 % 500 mL IVPB 2,500 mg 250 mL/hr   02/23/20 0023 New Bag/Given   piperacillin-tazobactam (ZOSYN) IVPB 2.25 g 2.25 g 100 mL/hr   02/23/20 1002 New Bag/Given   vancomycin (VANCOCIN) IVPB 1000 mg/200 mL premix 1,000 mg 200 mL/hr   02/23/20 1232 New Bag/Given   piperacillin-tazobactam (ZOSYN) IVPB 2.25 g 2.25 g 100 mL/hr   02/24/20 0115 New Bag/Given   piperacillin-tazobactam (ZOSYN) IVPB 2.25 g 2.25 g 100 mL/hr   02/24/20 0902 New Bag/Given   piperacillin-tazobactam (ZOSYN) IVPB 2.25 g 2.25 g 100 mL/hr       Subjective: Seen and examined this morning.  She has no complaints.  Pain is controlled.  She is not really excited about going to SNF if needed.  Objective: Vitals:   02/25/20 1115 02/25/20 1130 02/25/20 1200 02/25/20 1230  BP: (!) 136/58 (!) 130/40 140/68 (!) 160/75  Pulse: 76 76 77 76  Resp: 16 16 16 16   Temp: 98.6 F (37 C) 98.3 F (36.8 C) 98.7 F (37.1 C)   TempSrc: Oral Oral Oral   SpO2: 100% 100%    Weight:      Height:        Intake/Output Summary (Last 24 hours) at 02/25/2020 1255 Last data filed at 02/25/2020 0815 Gross per 24 hour  Intake 929.67 ml  Output 50 ml  Net 879.67  ml   Filed Weights   02/23/20 1138 02/24/20 1132 02/25/20 1040  Weight: 104.9 kg 104.9 kg 107.3 kg    Examination:  General exam: Appears calm and comfortable  Respiratory system: Clear to auscultation. Respiratory effort normal. Cardiovascular system: S1 & S2 heard, RRR. No JVD, murmurs, rubs, gallops or clicks. No pedal edema. Gastrointestinal system: Abdomen is nondistended, soft and nontender. No organomegaly or masses felt. Normal bowel sounds heard. Central nervous system: Alert and oriented. No focal neurological deficits. Extremities: Dressing in the left foot. Skin: No rashes, lesions or ulcers.  Psychiatry: Judgement and insight appear normal. Mood & affect appropriate.   Data Reviewed: I have personally reviewed following labs and imaging studies  CBC: Recent Labs  Lab 02/22/20 1152 02/23/20 0049 02/25/20 0111  WBC 17.2* 17.1* 18.7*  NEUTROABS 12.8*  --  14.3*  HGB 8.2* 7.6* 6.2*  HCT 27.3* 25.2* 20.5*  MCV  85.0 84.3 84.4  PLT 640* 592* 564*    Basic Metabolic Panel: Recent Labs  Lab 02/22/20 1152 02/23/20 0049 02/25/20 0111  NA 137 137 135  K 3.7 3.8 4.2  CL 99 101 96*  CO2 26 24 27   GLUCOSE 79 99 136*  BUN 18 22 23   CREATININE 6.76* 7.56* 7.46*  CALCIUM 8.4* 8.4* 8.0*  PHOS  --   --  5.1*    GFR: Estimated Creatinine Clearance: 9.3 mL/min (A) (by C-G formula based on SCr of 7.46 mg/dL (H)).  Liver Function Tests: Recent Labs  Lab 02/22/20 1152  AST 9*  ALT 8  ALKPHOS 58  BILITOT 0.5  PROT 7.2  ALBUMIN 2.4*    CBG: Recent Labs  Lab 02/24/20 1101 02/24/20 1355 02/24/20 1655 02/24/20 2201 02/25/20 0634  GLUCAP 104* 122* 172* 114* 149*     Recent Results (from the past 240 hour(s))  Blood culture (routine x 2)     Status: None (Preliminary result)   Collection Time: 02/22/20  1:00 PM   Specimen: BLOOD RIGHT ARM  Result Value Ref Range Status   Specimen Description BLOOD RIGHT ARM  Final   Special Requests   Final    BOTTLES  DRAWN AEROBIC AND ANAEROBIC Blood Culture results may not be optimal due to an inadequate volume of blood received in culture bottles   Culture   Final    NO GROWTH 2 DAYS Performed at [facility], [place] [address], [place], [place] [address]    Report Status PENDING  Incomplete  Respiratory Panel by RT PCR (Flu A&B, Covid) - Nasopharyngeal Swab     Status: None   Collection Time: 02/22/20  3:37 PM   Specimen: Nasopharyngeal Swab  Result Value Ref Range Status   SARS Coronavirus 2 by RT PCR NEGATIVE NEGATIVE Final    Comment: (NOTE) SARS-CoV-2 target nucleic acids are NOT DETECTED.  The SARS-CoV-2 RNA is generally detectable in upper respiratoy specimens during the acute phase of infection. The lowest concentration of SARS-CoV-2 viral copies this assay can detect is 131 copies/mL. A negative result does not preclude SARS-Cov-2 infection and should not be used as the sole basis for treatment or other patient management decisions. A negative result may occur with  improper specimen collection/handling, submission of specimen other than nasopharyngeal swab, presence of viral mutation(s) within the areas targeted by this assay, and inadequate number of viral copies (<131 copies/mL). A negative result must be combined with clinical observations, patient history, and epidemiological information. The expected result is Negative.  Fact Sheet for Patients:  PinkCheek.be  Fact Sheet for Healthcare Providers:  GravelBags.it  This test is no t yet approved or cleared by the [place] FDA and  has been authorized for detection and/or diagnosis of SARS-CoV-2 by FDA under an Emergency Use Authorization (EUA). This EUA will remain  in effect (meaning this test can be used) for the duration of the COVID-19 declaration under Section 564(b)(1) of the Act, 21 U.S.C. section 360bbb-3(b)(1), unless the authorization is terminated  or revoked sooner.     Influenza A by PCR NEGATIVE NEGATIVE Final   Influenza B by PCR NEGATIVE NEGATIVE Final    Comment: (NOTE) The Xpert Xpress SARS-CoV-2/FLU/RSV assay is intended as an aid in  the diagnosis of influenza from Nasopharyngeal swab specimens and  should not be used as a sole basis for treatment. Nasal washings and  aspirates are unacceptable for Xpert Xpress SARS-CoV-2/FLU/RSV  testing.  Fact Sheet for  Patients: PinkCheek.be  Fact Sheet for Healthcare Providers: GravelBags.it  This test is not yet approved or cleared by the [place] FDA and  has been authorized for detection and/or diagnosis of SARS-CoV-2 by  FDA under an Emergency Use Authorization (EUA). This EUA will remain  in effect (meaning this test can be used) for the duration of the  Covid-19 declaration under Section 564(b)(1) of the Act, 21  U.S.C. section 360bbb-3(b)(1), unless the authorization is  terminated or revoked. Performed at [facility], [place] [address] [place], [place] [address]   Surgical pcr screen     Status: None   Collection Time: 02/24/20 10:26 AM   Specimen: Nasal Mucosa; Nasal Swab  Result Value Ref Range Status   MRSA, PCR NEGATIVE NEGATIVE Final   Staphylococcus aureus NEGATIVE NEGATIVE Final    Comment: (NOTE) The Xpert SA Assay (FDA approved for NASAL specimens in patients 34 years of age and older), is one component of a comprehensive surveillance program. It is not intended to diagnose infection nor to guide or monitor treatment. Performed at [facility], [place] [address] [place],  [address]          Radiology Studies: PERIPHERAL VASCULAR CATHETERIZATION  Result Date: 02/23/2020 Patient name: FRANCIA VERRY         MRN: [id number]        DOB: [birth year]          Sex: female  02/22/2020 - 02/23/2020 Pre-operative Diagnosis: Critical limb ischemia of the bilateral lower  extremities with tissue loss Post-operative diagnosis:  Same Surgeon:  [name], MD Assistant:  Yevonne Aline. Stanford Breed, MD Procedure Performed: 1.  Ultrasound guided access of right common femoral artery 2.  Aortogram including catheter selection of aorta 3.  Bilateral lower extremity arteriogram with selection of third order branches in the left lower extremity 4.  Left anterior tibial artery angioplasty (2.5 mm x 220 mm Coyote) 5.  Mynx closure of the right common femoral artery 6.  67 minutes of monitored moderate conscious sedation time  Indications: Patient is a 63 year old female well-known to the vascular surgery service who has previously undergone a right leg tibial intervention for critical limb ischemia.  She was seen yesterday in consultation by Dr. Scot Dock with gangrene of her left toes as well as gangrene of a right toe as well.  She presents today for planned lower extremity arteriogram and possible intervention with a focus on the left leg.  Findings:  Aortogram showed no flow-limiting stenosis in the aortoiliac segment.  Left lower extremity arteriogram showed a patent common femoral, profunda, SFA, above and below-knee popliteal artery with no flow limiting stenosis.  Patient has severe tibial disease.  Dominant runoff appears to be the anterior tibial that had multifocal high-grade >80% stenosis throughout a long segment over 200 mm.  The peroneal was also patent although was very small and diminutive and had some high-grade stenosis proximally.  The posterior tibial occluded shortly after takeoff from the TP trunk.  The left anterior tibial artery disease was crossed antegrade and the entire anterior tibial was angioplastied with a 2.5 mm Coyote.  No evidence of residual stenosis and no dissection.  She now has inline flow down the left lower extremity with a patent AT with flow into the foot.  Right lower extremity runoff  was obtained at the end of the case and this shows a patent  common femoral profunda SFA popliteal and inline flow through the anterior tibial  although it does have diffuse moderate disease.             Procedure:  The patient was identified in the holding area and taken to room 8.  The patient was then placed supine on the table and prepped and draped in the usual sterile fashion.  A time out was called.  Ultrasound was used to evaluate the right common femoral artery.  It was patent .  A digital ultrasound image was acquired.  A micropuncture needle was used to access the right common femoral artery under ultrasound guidance.  An 018 wire was advanced without resistance and a micropuncture sheath was placed.  The 018 wire was removed and a benson wire was placed.  The micropuncture sheath was exchanged for a 5 french sheath.  An omniflush catheter was advanced over the wire to the level of L-1.  An abdominal angiogram was obtained.  Next, using the omniflush catheter and a benson wire, the aortic bifurcation was crossed and the catheter was placed into theleft external iliac artery and left runoff was obtained.  Only decision was made to intervene.  Ultimately the aortic bifurcation was crossed with a Omni Flush catheter and Glidewire advantage.  Once the wire was advanced into the SFA a long 6 French 90 cm shuttle sheath was placed.  Patient was given 100 units per kilogram heparin.  The anterior tibial artery was then cannulated with an 0.014 Zilient wire and the lesion was crossed all the way into the ankle and confirmed on hand-injection that we were in the true lumen.  The entire AT was then angioplastied with a 2.5 mm Coyote throughout its entire length for 2 minutes for a prolonged inflation time.  No evidence of residual stenosis or dissection with inline flow down the left lower extremity through the anterior tibial into the foot.  That point in time wires and catheters were removed.  Retrograde runoff in the right groin sheath was then obtained to evaluate the  right leg and the anterior tibial intervention remains patent although diffusely diseased.  Mynx closure was deployed in the right groin.  Taken to holding in stable condition.  Plan: Patient will continue dual antiplatelet therapy.  Should will be posted for left TMA tomorrow.  [name], MD [facility] Office: [phone]  Scheduled Meds: . sodium chloride   Intravenous Once  . acetaminophen  650 mg Oral Once  . amLODipine  10 mg Oral QHS  . aspirin EC  81 mg Oral Daily  . atorvastatin  40 mg Oral QHS  . Chlorhexidine Gluconate Cloth  6 each Topical Q0600  . clopidogrel  75 mg Oral QHS  . diphenhydrAMINE  25 mg Intravenous Once  . heparin  5,000 Units Subcutaneous Q12H  . insulin aspart  0-15 Units Subcutaneous TID WC  . insulin aspart  0-5 Units Subcutaneous QHS  . insulin glargine  50 Units Subcutaneous Daily  . metoprolol succinate  100 mg Oral QHS  . multivitamin  1 tablet Oral QHS  . sevelamer carbonate  2,400 mg Oral TID WC   And  . sevelamer carbonate  1,600 mg Oral With snacks  . sodium chloride flush  3 mL Intravenous Q12H  . sodium chloride flush  3 mL Intravenous Q12H   Continuous Infusions: . sodium chloride    . sodium chloride       LOS: 3 days   Darliss Cheney, MD [facility]  To contact the attending provider  between 7A-7P or the covering provider during after hours 7P-7A, please log into the web site [email or web address] and access using universal [place] password for that web site. If you do not have the password, please call the hospital operator.  02/25/2020, 12:55 PM

## 2020-02-25 NOTE — Progress Notes (Addendum)
   VASCULAR SURGERY ASSESSMENT & PLAN:   1 Day Post-Op: left transmetatarsal amputation: minimal pain currently. 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 Carlis Abbott. She had angioplasty of the anterior tibial artery which is her dominant runoff.  Anemia: multifactorial: CKD/acute blood loss: Hgb drop to 6.2 g/dL. Defer transfusion to primary care team.  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 No CP or SOB  PHYSICAL EXAM:   Vitals:   02/24/20 1722 02/24/20 1953 02/24/20 2300 02/25/20 0400  BP: (!) 110/49 (!) 114/52 (!) 116/59 (!) 123/58  Pulse: 67 76 74 74  Resp: 20 18 16 18   Temp: 98 F (36.7 C) 98.2 F (36.8 C) 98 F (36.7 C) 98.2 F (36.8 C)  TempSrc: Oral Oral Oral Oral  SpO2: 96% 94% 100% 100%  Weight:      Height:       Awake, alert in NAD RRR nonlabored Right groin soft without hematoma Left foot surgical dressing dry and intact RUE AVG with good bruit  LABS:   Lab Results  Component Value Date   WBC 18.7 (H) 02/25/2020   HGB 6.2 (LL) 02/25/2020   HCT 20.5 (L) 02/25/2020   MCV 84.4 02/25/2020   PLT 564 (H) 02/25/2020   Lab Results  Component Value Date   CREATININE 7.46 (H) 02/25/2020   No results found for: INR, PROTIME CBG (last 3)  Recent Labs    02/24/20 1655 02/24/20 2201 02/25/20 0634  GLUCAP 172* 114* 149*    PROBLEM LIST:    Active Problems:   Diabetes ([place])   Anemia in chronic kidney disease   Essential (primary) hypertension   Osteomyelitis of third toe of right foot (HCC)   Osteomyelitis (HCC)   Hyperglycemia due to diabetes mellitus (HCC)   Gangrene ([place])   CURRENT MEDS:   . sodium chloride   Intravenous Once  . acetaminophen  650 mg Oral Once  . amLODipine  10 mg Oral QHS  . aspirin EC  81 mg Oral Daily  . atorvastatin  40 mg Oral  QHS  . Chlorhexidine Gluconate Cloth  6 each Topical Q0600  . clopidogrel  75 mg Oral QHS  . diphenhydrAMINE  25 mg Intravenous Once  . heparin  5,000 Units Subcutaneous Q12H  . insulin aspart  0-15 Units Subcutaneous TID WC  . insulin aspart  0-5 Units Subcutaneous QHS  . insulin glargine  50 Units Subcutaneous Daily  . metoprolol succinate  100 mg Oral QHS  . multivitamin  1 tablet Oral QHS  . sevelamer carbonate  2,400 mg Oral TID WC   And  . sevelamer carbonate  1,600 mg Oral With snacks  . sodium chloride flush  3 mL Intravenous Q12H  . sodium chloride flush  3 mL Intravenous Q12H    Risa Grill, PA-C Office: [phone] 02/25/2020   Late entry:  Examined with PA Setzer. Agree with above. Hgb drift noted. Defer decision to transfuse to primary team. No concerning findings on bedside exam. Dressing down and OOB tomorrow. Following along.  Yevonne Aline. Stanford Breed, MD 02/25/20

## 2020-02-25 NOTE — Progress Notes (Signed)
[facility] Progress Note   OP HD:MWF [facility] 4h per pt, edw 105.5kg ?  Assessment/ Plan:   1. L foot infection /cellulitis/ gangrene - started IV abxvanc/ zosyn, VVS successful revasc by Dr. Carlis Abbott on 10/27 and lt TMA 10/28 2. ESRD - on HD MWF.HD Fri; confirmed with HD unit. 3. BP / volume - cont home BP meds, no gross vol excess.  4. Anemia ckd -Hb 8 5. MBD ckd - cont binder; recheck phos in the AM (only 2.5 on 9/2). Phos 5.1 10/29. Continue current regimen 6. DM2 - on insulin  Subjective:   Denies f/c/n/v/ CP/ dyspnea; pain well controlled   Objective:   BP (!) 133/57 (BP Location: Left Arm)   Pulse 74   Temp 98.4 F (36.9 C) (Oral)   Resp 14   Ht 5\' 4"  (1.626 m)   Wt 104.9 kg   LMP  (LMP Unknown)   SpO2 100%   BMI 39.70 kg/m   Intake/Output Summary (Last 24 hours) at 02/25/2020 0909 Last data filed at 02/24/2020 1540 Gross per 24 hour  Intake 689.67 ml  Output 50 ml  Net 639.67 ml   Weight change: -1.5 kg  Physical Exam: Genalert, no distress No jvd or bruits Chest clear bilat RRR no MRG Abd soft ntnd no mass or ascites +bsobese Ext  L TMA > Rtoe dry gangrene Neuro is alert, Ox 3 , nf RUA AVG+bruit  Imaging: PERIPHERAL VASCULAR CATHETERIZATION  Result Date: 02/23/2020 Patient name: Christy Zhang         MRN: [id number]        DOB: [birth year]          Sex: female  02/22/2020 - 02/23/2020 Pre-operative Diagnosis: Critical limb ischemia of the bilateral lower extremities with tissue loss Post-operative diagnosis:  Same Surgeon:  [name], MD Assistant:  Yevonne Aline. Stanford Breed, MD Procedure Performed: 1.  Ultrasound guided access of right common femoral artery 2.  Aortogram including catheter selection of aorta 3.  Bilateral lower extremity arteriogram with selection of third order branches in the left lower extremity 4.  Left anterior tibial artery angioplasty (2.5 mm x 220 mm Coyote) 5.  Mynx closure of the right common  femoral artery 6.  67 minutes of monitored moderate conscious sedation time  Indications: Patient is a 64 year old female well-known to the vascular surgery service who has previously undergone a right leg tibial intervention for critical limb ischemia.  She was seen yesterday in consultation by Dr. Scot Dock with gangrene of her left toes as well as gangrene of a right toe as well.  She presents today for planned lower extremity arteriogram and possible intervention with a focus on the left leg.  Findings:  Aortogram showed no flow-limiting stenosis in the aortoiliac segment.  Left lower extremity arteriogram showed a patent common femoral, profunda, SFA, above and below-knee popliteal artery with no flow limiting stenosis.  Patient has severe tibial disease.  Dominant runoff appears to be the anterior tibial that had multifocal high-grade >80% stenosis throughout a long segment over 200 mm.  The peroneal was also patent although was very small and diminutive and had some high-grade stenosis proximally.  The posterior tibial occluded shortly after takeoff from the TP trunk.  The left anterior tibial artery disease was crossed antegrade and the entire anterior tibial was angioplastied with a 2.5 mm Coyote.  No evidence of residual stenosis and no dissection.  She now has inline flow down the left lower extremity with  a patent AT with flow into the foot.  Right lower extremity runoff  was obtained at the end of the case and this shows a patent common femoral profunda SFA popliteal and inline flow through the anterior tibial although it does have diffuse moderate disease.             Procedure:  The patient was identified in the holding area and taken to room 8.  The patient was then placed supine on the table and prepped and draped in the usual sterile fashion.  A time out was called.  Ultrasound was used to evaluate the right common femoral artery.  It was patent .  A digital ultrasound image was acquired.  A  micropuncture needle was used to access the right common femoral artery under ultrasound guidance.  An 018 wire was advanced without resistance and a micropuncture sheath was placed.  The 018 wire was removed and a benson wire was placed.  The micropuncture sheath was exchanged for a 5 french sheath.  An omniflush catheter was advanced over the wire to the level of L-1.  An abdominal angiogram was obtained.  Next, using the omniflush catheter and a benson wire, the aortic bifurcation was crossed and the catheter was placed into theleft external iliac artery and left runoff was obtained.  Only decision was made to intervene.  Ultimately the aortic bifurcation was crossed with a Omni Flush catheter and Glidewire advantage.  Once the wire was advanced into the SFA a long 6 French 90 cm shuttle sheath was placed.  Patient was given 100 units per kilogram heparin.  The anterior tibial artery was then cannulated with an 0.014 Zilient wire and the lesion was crossed all the way into the ankle and confirmed on hand-injection that we were in the true lumen.  The entire AT was then angioplastied with a 2.5 mm Coyote throughout its entire length for 2 minutes for a prolonged inflation time.  No evidence of residual stenosis or dissection with inline flow down the left lower extremity through the anterior tibial into the foot.  That point in time wires and catheters were removed.  Retrograde runoff in the right groin sheath was then obtained to evaluate the right leg and the anterior tibial intervention remains patent although diffusely diseased.  Mynx closure was deployed in the right groin.  Taken to holding in stable condition.  Plan: Patient will continue dual antiplatelet therapy.  Should will be posted for left TMA tomorrow.  [name], MD [facility] Office: [phone]   Labs: BMET Recent Labs  Lab 02/22/20 1152 02/23/20 0049 02/25/20 0111  NA 137 137 135  K 3.7  3.8 4.2  CL 99 101 96*  CO2 26 24 27   GLUCOSE 79 99 136*  BUN 18 22 23   CREATININE 6.76* 7.56* 7.46*  CALCIUM 8.4* 8.4* 8.0*  PHOS  --   --  5.1*   CBC Recent Labs  Lab 02/22/20 1152 02/23/20 0049 02/25/20 0111  WBC 17.2* 17.1* 18.7*  NEUTROABS 12.8*  --  14.3*  HGB 8.2* 7.6* 6.2*  HCT 27.3* 25.2* 20.5*  MCV 85.0 84.3 84.4  PLT 640* 592* 564*    Medications:    . sodium chloride   Intravenous Once  . acetaminophen  650 mg Oral Once  . amLODipine  10 mg Oral QHS  . aspirin EC  81 mg Oral Daily  . atorvastatin  40 mg Oral QHS  . Chlorhexidine Gluconate Cloth  6 each Topical Q0600  . clopidogrel  75 mg Oral QHS  . diphenhydrAMINE  25 mg Intravenous Once  . heparin  5,000 Units Subcutaneous Q12H  . insulin aspart  0-15 Units Subcutaneous TID WC  . insulin aspart  0-5 Units Subcutaneous QHS  . insulin glargine  50 Units Subcutaneous Daily  . metoprolol succinate  100 mg Oral QHS  . multivitamin  1 tablet Oral QHS  . sevelamer carbonate  2,400 mg Oral TID WC   And  . sevelamer carbonate  1,600 mg Oral With snacks  . sodium chloride flush  3 mL Intravenous Q12H  . sodium chloride flush  3 mL Intravenous Q12H      Otelia Santee, MD 02/25/2020, 9:09 AM

## 2020-02-25 NOTE — Progress Notes (Signed)
Chotiner, MD notified of critical lab result: hemoglobin 6.2  Awaiting new orders.  Will continue to monitor.

## 2020-02-26 DIAGNOSIS — M86279 Subacute osteomyelitis, unspecified ankle and foot: Secondary | ICD-10-CM | POA: Diagnosis not present

## 2020-02-26 DIAGNOSIS — I96 Gangrene, not elsewhere classified: Secondary | ICD-10-CM | POA: Diagnosis not present

## 2020-02-26 LAB — BPAM RBC
Blood Product Expiration Date: 202111182359
Blood Product Expiration Date: 202111182359
ISSUE DATE / TIME: 202110291101
ISSUE DATE / TIME: 202110291101
Unit Type and Rh: 7300
Unit Type and Rh: 7300

## 2020-02-26 LAB — CBC WITH DIFFERENTIAL/PLATELET
Abs Immature Granulocytes: 0.2 10*3/uL — ABNORMAL HIGH (ref 0.00–0.07)
Basophils Absolute: 0.1 10*3/uL (ref 0.0–0.1)
Basophils Relative: 0 %
Eosinophils Absolute: 0.5 10*3/uL (ref 0.0–0.5)
Eosinophils Relative: 2 %
HCT: 26.1 % — ABNORMAL LOW (ref 36.0–46.0)
Hemoglobin: 8.3 g/dL — ABNORMAL LOW (ref 12.0–15.0)
Immature Granulocytes: 1 %
Lymphocytes Relative: 10 %
Lymphs Abs: 2.2 10*3/uL (ref 0.7–4.0)
MCH: 27 pg (ref 26.0–34.0)
MCHC: 31.8 g/dL (ref 30.0–36.0)
MCV: 85 fL (ref 80.0–100.0)
Monocytes Absolute: 1.6 10*3/uL — ABNORMAL HIGH (ref 0.1–1.0)
Monocytes Relative: 8 %
Neutro Abs: 17.1 10*3/uL — ABNORMAL HIGH (ref 1.7–7.7)
Neutrophils Relative %: 79 %
Platelets: 524 10*3/uL — ABNORMAL HIGH (ref 150–400)
RBC: 3.07 MIL/uL — ABNORMAL LOW (ref 3.87–5.11)
RDW: 15.3 % (ref 11.5–15.5)
WBC: 21.6 10*3/uL — ABNORMAL HIGH (ref 4.0–10.5)
nRBC: 0 % (ref 0.0–0.2)

## 2020-02-26 LAB — BASIC METABOLIC PANEL
Anion gap: 12 (ref 5–15)
BUN: 13 mg/dL (ref 8–23)
CO2: 26 mmol/L (ref 22–32)
Calcium: 8.1 mg/dL — ABNORMAL LOW (ref 8.9–10.3)
Chloride: 99 mmol/L (ref 98–111)
Creatinine, Ser: 5.37 mg/dL — ABNORMAL HIGH (ref 0.44–1.00)
GFR, Estimated: 8 mL/min — ABNORMAL LOW (ref >60)
Glucose, Bld: 136 mg/dL — ABNORMAL HIGH (ref 70–99)
Potassium: 4.1 mmol/L (ref 3.5–5.1)
Sodium: 137 mmol/L (ref 135–145)

## 2020-02-26 LAB — TYPE AND SCREEN
ABO/RH(D): B POS
Antibody Screen: NEGATIVE
Unit division: 0
Unit division: 0

## 2020-02-26 LAB — GLUCOSE, CAPILLARY
Glucose-Capillary: 186 mg/dL — ABNORMAL HIGH (ref 70–99)
Glucose-Capillary: 157 mg/dL — ABNORMAL HIGH (ref 70–99)
Glucose-Capillary: 122 mg/dL — ABNORMAL HIGH (ref 70–99)

## 2020-02-26 MED ORDER — INSULIN GLARGINE 100 UNIT/ML ~~LOC~~ SOLN
55.0000 [IU] | Freq: Every day | SUBCUTANEOUS | Status: DC
  Administered 2020-02-26 – 2020-02-27 (×2): 55 [IU] via SUBCUTANEOUS
  Filled 2020-02-26 (×2): qty 0.55

## 2020-02-26 NOTE — Progress Notes (Signed)
Orthopedic Tech Progress Note Patient Details:  Shakiera Edelson [facility] Jul 12, 1955 [id number] Left bedside for patient after sizing.  Ortho Devices Type of Ortho Device: Postop shoe/boot Ortho Device/Splint Location: LLE Ortho Device/Splint Interventions: Ordered   Post Interventions Instructions Provided: Adjustment of device, Care of device   Randol Zumstein A Gracyn Santillanes 02/26/2020, 9:37 AM

## 2020-02-26 NOTE — Progress Notes (Addendum)
VASCULAR SURGERY ASSESSMENT & PLAN:   Day 2 Post-Op: left transmetatarsal amputation: minimal pain currently. OOB today>>left heel bearing only. Darco shoe.  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 Carlis Abbott. She had angioplasty of the anterior tibial artery which is her dominant runoff.  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   SUBJECTIVE:   Minimal pain.  PHYSICAL EXAM:   Vitals:   02/25/20 2233 02/26/20 0044 02/26/20 0327 02/26/20 0454  BP:  (!) 131/54 (!) 158/62   Pulse: 85 88 79 74  Resp: 17 13 17 16   Temp:  99.1 F (37.3 C) 98.8 F (37.1 C)   TempSrc:  Oral Oral   SpO2: 97% 98% 93% 99%  Weight:      Height:       Awake, alert in NAD RRR nonlabored Right groin soft without hematoma Left foot surgical dressing removed: incision well approximated. Foot well perfused. Right third toe with dry gangrene. No drainage, erythema or maceration. RUE AVG with good bruit   LABS:   Lab Results  Component Value Date   WBC 21.6 (H) 02/26/2020   HGB 8.3 (L) 02/26/2020   HCT 26.1 (L) 02/26/2020   MCV 85.0 02/26/2020   PLT 524 (H) 02/26/2020   Lab Results  Component Value Date   CREATININE 5.37 (H) 02/26/2020   No results found for: INR, PROTIME CBG (last 3)  Recent Labs    02/25/20 1629 02/25/20 2213 02/26/20 0549  GLUCAP 128* 186* 157*    PROBLEM LIST:    Active Problems:   Diabetes ([place])   Anemia in chronic kidney disease   Essential (primary) hypertension   Osteomyelitis of third toe of right foot (HCC)   Osteomyelitis (HCC)   Hyperglycemia due to diabetes mellitus (HCC)   Gangrene ([place])   CURRENT MEDS:   . sodium chloride   Intravenous Once  . acetaminophen  650 mg Oral Once  . amLODipine  10 mg Oral  QHS  . aspirin EC  81 mg Oral Daily  . atorvastatin  40 mg Oral QHS  . Chlorhexidine Gluconate Cloth  6 each Topical Q0600  . clopidogrel  75 mg Oral QHS  . diphenhydrAMINE  25 mg Intravenous Once  . heparin  5,000 Units Subcutaneous Q12H  . insulin aspart  0-15 Units Subcutaneous TID WC  . insulin aspart  0-5 Units Subcutaneous QHS  . insulin glargine  50 Units Subcutaneous Daily  . metoprolol succinate  100 mg Oral QHS  . multivitamin  1 tablet Oral QHS  . sevelamer carbonate  2,400 mg Oral TID WC   And  . sevelamer carbonate  1,600 mg Oral With snacks  . sodium chloride flush  3 mL Intravenous Q12H  . sodium chloride flush  3 mL Intravenous Q12H    Risa Grill, PA-C Office: [phone] 02/26/2020   I have interviewed the patient and examined the patient. I agree with the findings by the PA.  I inspected her left transmetatarsal amputation site and this looks good so far.  No significant drainage.  The wound was redressed.  We have written for daily dressing changes.  We will start physical therapy, partial weightbearing left foot heel only with Darco shoe.  She has a brisk anterior tibial signal with the Doppler status post successful endovascular revascularization  on the left.  Gae Gallop, MD [phone]

## 2020-02-26 NOTE — Progress Notes (Signed)
PROGRESS NOTE    Christy Zhang  ZOX:[id number] DOB: 01-12-1956 DOA: 02/22/2020 PCP: Claretta Fraise, MD    Chief Complaint  Patient presents with  . Wound Check    Brief Narrative:  Christy Zhang is a 64 y.o. 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  Patient developed right third toe gangrene-like changes in summer, when patient was admitted to hospital for groin infection.  Patient was treated with Tressie Ellis for both infections, and work-up by vascular surgery showed bilateral third toe gangrene changes angiogram showed on July 22 that severe right tibial single-vessel runoff at mid segment with occlusion just above the ankle, on the left side, there is a severe tibial disease in the AT and peroneal artery heavily calcified with multiple segment high-grade stenosis with increased risk of limb loss. Patient claimed that she has not been following with either vascular or podiatrist since July.  But she has been following with wound care center for the right groin wound.  Patient started noticing more discoloration changes of the right 2nd and 4th toe 3 to 4 days prior to admission and gradually she started to notice rash and warmness of the right leg below knee. patient went to wound care center for wound care of the right groin, where staff noticed extensive changes of the left foot concerning for acute infection and sent patient down to ER.  Upon arrival to ED, patient had increased leukocytosis of 17 which seems stable compared to 17.5 on last admission, x-ray showed left foot negative finding regarding acute bony changes.  Right 3rd toe showed signs of osteomyelitis.  Vascular surgery was consulted.  Patient was found to have a left third toe gangrene, She underwent lower extremity arteriogram and angioplasty  of the left anterior tibial artery on 02/23/2020 and was also found to have occlusion of the right common femoral artery.  Underwent left TMA on 02/24/2020 and then underwent a left transmetatarsal amputation on 02/24/2020.   Assessment & Plan:   Active Problems:   Diabetes ([place])   Anemia in chronic kidney disease   Essential (primary) hypertension   Osteomyelitis of third toe of right foot (HCC)   Osteomyelitis (HCC)   Hyperglycemia due to diabetes mellitus ([place])   Gangrene (HCC)  Gangrene of the left foot/severe peripheral vascular disease X-rays do not show any osteomyelitis at this time. Patient has severe tibial artery occlusive disease bilaterally based on her previous arteriogram.  She underwent lower extremity arteriogram and angioplasty of the left anterior tibial artery on 02/23/2020 and was also found to have occlusion of the right common femoral artery.  Underwent left TMA on 02/24/2020 and then underwent a left transmetatarsal amputation on 02/24/2020.  She remains on DAPT.  PT OT consulted today.  Essential hypertension Blood pressure controlled.  Continue increased dose of Norvasc 10 mg and Toprol-XL 100 mg p.o. daily.  Anemia of chronic disease/acute blood loss anemia/postop anemia: Hemoglobin dropped to 6.2 on 02/25/2020 for which she received 2 units of PRBC.  Hemoglobin 8.3 today.  Insulin-dependent diabetes mellitus CBG (last 3)  Recent Labs    02/25/20 1629 02/25/20 2213 02/26/20 0549  GLUCAP 128* 186* 157*   Blood sugar slightly elevated.  Increase Lantus to 55 units and continue SSI.  End-stage renal disease on dialysis. Nephrology on board.  Dialysis per schedule.  Leukocytosis/thrombocytosis  probably secondary to gangrene  DVT prophylaxis: Heparin Code Status: Full code Family Communication: None at bedside.  Plan of care discussed with patient.  She is alert and oriented and has capacity to make decisions.  disposition:   Status is:  Inpatient  Remains inpatient appropriate because:Ongoing diagnostic testing needed not appropriate for outpatient work up, Unsafe d/c plan and IV treatments appropriate due to intensity of illness or inability to take PO   Dispo: The patient is from: Home              Anticipated d/c is to: Pending will likely require SNF.              Anticipated d/c date is: 1 to 2 days.              Patient currently is medically stable to d/c.       Consultants:   Podiatry Dr. Earleen Newport  Vascular surgery  Nephrology   Procedures:   HD lower extremity arteriogram and angioplasty of the left anterior tibial artery on 02/23/2020 left TMA on 02/24/2020  eft transmetatarsal amputation on 02/24/2020    Antimicrobials:  Antibiotics Given (last 72 hours)    Date/Time Action Medication Dose Rate   02/23/20 1232 New Bag/Given   piperacillin-tazobactam (ZOSYN) IVPB 2.25 g 2.25 g 100 mL/hr   02/24/20 0115 New Bag/Given   piperacillin-tazobactam (ZOSYN) IVPB 2.25 g 2.25 g 100 mL/hr   02/24/20 0902 New Bag/Given   piperacillin-tazobactam (ZOSYN) IVPB 2.25 g 2.25 g 100 mL/hr       Subjective: Seen and examined.  She has no complaints.  Objective: Vitals:   02/26/20 0044 02/26/20 0327 02/26/20 0454 02/26/20 0936  BP: (!) 131/54 (!) 158/62  111/70  Pulse: 88 79 74 79  Resp: 13 17 16 14   Temp: 99.1 F (37.3 C) 98.8 F (37.1 C)  98.3 F (36.8 C)  TempSrc: Oral Oral  Oral  SpO2: 98% 93% 99% 100%  Weight:      Height:        Intake/Output Summary (Last 24 hours) at 02/26/2020 1218 Last data filed at 02/25/2020 1446 Gross per 24 hour  Intake --  Output 2000 ml  Net -2000 ml   Filed Weights   02/24/20 1132 02/25/20 1040 02/25/20 1446  Weight: 104.9 kg 107.3 kg 105.4 kg    Examination:  General exam: Appears calm and comfortable  Respiratory system: Clear to auscultation. Respiratory effort normal. Cardiovascular system: S1 & S2 heard, RRR. No JVD, murmurs, rubs, gallops or  clicks. No pedal edema. Gastrointestinal system: Abdomen is nondistended, soft and nontender. No organomegaly or masses felt. Normal bowel sounds heard. Central nervous system: Alert and oriented. No focal neurological deficits. Extremities: Dressing in the left foot. Skin: No rashes, lesions or ulcers.  Psychiatry: Judgement and insight appear normal. Mood & affect appropriate.   Data Reviewed: I have personally reviewed following labs and imaging studies  CBC: Recent Labs  Lab 02/22/20 1152 02/23/20 0049 02/25/20 0111 02/25/20 1818 02/26/20 0258  WBC 17.2* 17.1* 18.7*  --  21.6*  NEUTROABS 12.8*  --  14.3*  --  17.1*  HGB 8.2* 7.6* 6.2* 9.1* 8.3*  HCT 27.3* 25.2* 20.5* 27.5* 26.1*  MCV 85.0 84.3 84.4  --  85.0  PLT 640* 592* 564*  --  524*    Basic Metabolic Panel: Recent Labs  Lab 02/22/20 1152 02/23/20 0049 02/25/20 0111 02/26/20 0258  NA 137 137 135 137  K 3.7 3.8 4.2 4.1  CL 99 101 96* 99  CO2 26 24 27 26   GLUCOSE 79 99 136* 136*  BUN 18 22 23 13   CREATININE 6.76* 7.56* 7.46* 5.37*  CALCIUM 8.4* 8.4* 8.0* 8.1*  PHOS  --   --  5.1*  --     GFR: Estimated Creatinine Clearance: 12.9 mL/min (A) (by C-G formula based on SCr of 5.37 mg/dL (H)).  Liver Function Tests: Recent Labs  Lab 02/22/20 1152  AST 9*  ALT 8  ALKPHOS 58  BILITOT 0.5  PROT 7.2  ALBUMIN 2.4*    CBG: Recent Labs  Lab 02/24/20 2201 02/25/20 0634 02/25/20 1629 02/25/20 2213 02/26/20 0549  GLUCAP 114* 149* 128* 186* 157*     Recent Results (from the past 240 hour(s))  Blood culture (routine x 2)     Status: None (Preliminary result)   Collection Time: 02/22/20  1:00 PM   Specimen: BLOOD RIGHT ARM  Result Value Ref Range Status   Specimen Description BLOOD RIGHT ARM  Final   Special Requests   Final    BOTTLES DRAWN AEROBIC AND ANAEROBIC Blood Culture results may not be optimal due to an inadequate volume of blood received in culture bottles   Culture   Final    NO GROWTH 3  DAYS Performed at [facility], [place] [address], [place], [place] [address]    Report Status PENDING  Incomplete  Respiratory Panel by RT PCR (Flu A&B, Covid) - Nasopharyngeal Swab     Status: None   Collection Time: 02/22/20  3:37 PM   Specimen: Nasopharyngeal Swab  Result Value Ref Range Status   SARS Coronavirus 2 by RT PCR NEGATIVE NEGATIVE Final    Comment: (NOTE) SARS-CoV-2 target nucleic acids are NOT DETECTED.  The SARS-CoV-2 RNA is generally detectable in upper respiratoy specimens during the acute phase of infection. The lowest concentration of SARS-CoV-2 viral copies this assay can detect is 131 copies/mL. A negative result does not preclude SARS-Cov-2 infection and should not be used as the sole basis for treatment or other patient management decisions. A negative result may occur with  improper specimen collection/handling, submission of specimen other than nasopharyngeal swab, presence of viral mutation(s) within the areas targeted by this assay, and inadequate number of viral copies (<131 copies/mL). A negative result must be combined with clinical observations, patient history, and epidemiological information. The expected result is Negative.  Fact Sheet for Patients:  PinkCheek.be  Fact Sheet for Healthcare Providers:  GravelBags.it  This test is no t yet approved or cleared by the [place] FDA and  has been authorized for detection and/or diagnosis of SARS-CoV-2 by FDA under an Emergency Use Authorization (EUA). This EUA will remain  in effect (meaning this test can be used) for the duration of the COVID-19 declaration under Section 564(b)(1) of the Act, 21 U.S.C. section 360bbb-3(b)(1), unless the authorization is terminated or revoked sooner.     Influenza A by PCR NEGATIVE NEGATIVE Final   Influenza B by PCR NEGATIVE NEGATIVE Final    Comment: (NOTE) The Xpert Xpress SARS-CoV-2/FLU/RSV  assay is intended as an aid in  the diagnosis of influenza from Nasopharyngeal swab specimens and  should not be used as a sole basis for treatment. Nasal washings and  aspirates are unacceptable for Xpert Xpress SARS-CoV-2/FLU/RSV  testing.  Fact Sheet for Patients: PinkCheek.be  Fact Sheet for Healthcare Providers: GravelBags.it  This test is not yet approved or cleared by the [place] FDA and  has  been authorized for detection and/or diagnosis of SARS-CoV-2 by  FDA under an Emergency Use Authorization (EUA). This EUA will remain  in effect (meaning this test can be used) for the duration of the  Covid-19 declaration under Section 564(b)(1) of the Act, 21  U.S.C. section 360bbb-3(b)(1), unless the authorization is  terminated or revoked. Performed at [facility], [place] [address], [place], [place] [address]   Surgical pcr screen     Status: None   Collection Time: 02/24/20 10:26 AM   Specimen: Nasal Mucosa; Nasal Swab  Result Value Ref Range Status   MRSA, PCR NEGATIVE NEGATIVE Final   Staphylococcus aureus NEGATIVE NEGATIVE Final    Comment: (NOTE) The Xpert SA Assay (FDA approved for NASAL specimens in patients 63 years of age and older), is one component of a comprehensive surveillance program. It is not intended to diagnose infection nor to guide or monitor treatment. Performed at [facility], [place] [address] [place], [place] [address]          Radiology Studies: No results found. Scheduled Meds: . sodium chloride   Intravenous Once  . acetaminophen  650 mg Oral Once  . amLODipine  10 mg Oral QHS  . aspirin EC  81 mg Oral Daily  . atorvastatin  40 mg Oral QHS  . Chlorhexidine Gluconate Cloth  6 each Topical Q0600  . clopidogrel  75 mg Oral QHS  . diphenhydrAMINE  25 mg Intravenous Once  . heparin  5,000 Units Subcutaneous Q12H  . insulin aspart  0-15 Units Subcutaneous TID WC  .  insulin aspart  0-5 Units Subcutaneous QHS  . insulin glargine  55 Units Subcutaneous Daily  . metoprolol succinate  100 mg Oral QHS  . multivitamin  1 tablet Oral QHS  . sevelamer carbonate  2,400 mg Oral TID WC   And  . sevelamer carbonate  1,600 mg Oral With snacks  . sodium chloride flush  3 mL Intravenous Q12H  . sodium chloride flush  3 mL Intravenous Q12H   Continuous Infusions: . sodium chloride    . sodium chloride       LOS: 4 days   Darliss Cheney, MD [facility]  To contact the attending provider between 7A-7P or the covering provider during after hours 7P-7A, please log into the web site [email or web address] and access using universal [place] password for that web site. If you do not have the password, please call the hospital operator.  02/26/2020, 12:18 PM

## 2020-02-26 NOTE — Evaluation (Signed)
Physical Therapy Evaluation Patient Details Name: Christy Zhang MRN: [id number] DOB: Feb 14, 1956 Today's Date: 02/26/2020   History of Present Illness  Christy Zhang a 64 y.o.femalewith medical history significant ofPVD withchronic right third toe gangrene, ESRD on HD Monday Wednesday Friday, IDDM with insulin resistance, HTN, HLD, presented with left leg rash swelling and toe color changes. Patient developed right third toe gangrene-like changes in summer, when patient was admitted to hospital for groin infection. Patient was treated with Christy Zhang for both infections,and work-up by vascular surgery showed bilateral third toe gangrene changes angiogram showed on July 22 that severe right tibial single-vessel runoff at mid segment with occlusion just above the ankle, on the left side, there is a severe tibial disease in the AT and peroneal artery heavily calcified with multiple segment high-grade stenosis with increased risk of limb loss.Patient claimed that she has not been following with either vascular or podiatrist since July. But she has been following with wound care center for the right groin wound.Patient started noticing more discoloration changes of the right 2nd and 4th toe 3 to 4 days prior to admission and gradually she started to notice rash and warmness of the right leg below knee. patient went to wound care center for wound care of the right groin, where staff noticed extensive changes of the left foot concerning for acute infection and sent patient down to ER.Vascular surgery was consulted.  Patient was found to have a left third toe gangrene, She underwent lower extremity arteriogram and angioplasty of the left anterior tibial artery on 02/23/2020 and was also found to have occlusion of the right common femoral artery.  Underwent left TMA on 02/24/2020 and then underwent a left transmetatarsal amputation on 02/24/2020.  Clinical Impression  Pt admitted with above diagnosis.  Pt was able to take pivotal steps to recliner with use of RW needing min to mod assist for safety during transfer.  Discussed family support and pt states she can slide into wheelchair and use it intiailly and would like HHPt f/u and go home if she can.  She said that her family provides 24 hour care.  Will follow acutely.   Pt currently with functional limitations due to the deficits listed below (see PT Problem List). Pt will benefit from skilled PT to increase their independence and safety with mobility to allow discharge to the venue listed below.      Follow Up Recommendations Home health PT;Supervision/Assistance - 24 hour    Equipment Recommendations   (grab bars in shower)    Recommendations for Other Services       Precautions / Restrictions Precautions Precautions: Fall Restrictions Weight Bearing Restrictions: Yes LLE Weight Bearing: Partial weight bearing (heel weight bearing only with Darco shoe per Christy Zhang)      Mobility  Bed Mobility Overal bed mobility: Needs Assistance Bed Mobility: Supine to Sit     Supine to sit: Min assist     General bed mobility comments:  A little assist needed as pt states she feels weak    Transfers Overall transfer level: Needs assistance Equipment used: Rolling walker (2 wheeled) Transfers: Sit to/from [facility] Sit to Stand: Min assist;From elevated surface Stand pivot transfers: Mod assist       General transfer comment: Pt able to stand with min assist to power up from elevated bed.  Pt had on her Darco shoe on her left foot and a Croc on her right foot. Took a few steps around to chair  and then even though cued did not reach back and PT had to assist her descent into chair.  Pt states that she typically has thesse same issues at home getting into wheelchair.   Ambulation/Gait                Stairs            Wheelchair Mobility    Modified Rankin (Stroke Patients Only)       Balance  Overall balance assessment: Needs assistance Sitting-balance support: No upper extremity supported;Feet supported Sitting balance-Leahy Scale: Fair     Standing balance support: Bilateral upper extremity supported;During functional activity Standing balance-Leahy Scale: Poor Standing balance comment: relies on UE support on RW and external support                             Pertinent Vitals/Pain Pain Assessment: Faces Faces Pain Scale: Hurts little more Pain Location: left LE  Pain Descriptors / Indicators: Aching;Grimacing;Guarding Pain Intervention(s): Limited activity within patient's tolerance;Monitored during session;Repositioned    Home Living Family/patient expects to be discharged to:: Private residence Living Arrangements: Children;Other relatives (daughter and grandbabies) Available Help at Discharge: Family;Available 24 hours/day (Daughter is Engineer, agricultural for pt) Type of Home: House Home Access: Ramped entrance     [place]: Two level;Able to live on main level with bedroom/bathroom Home Equipment: Wheelchair - [employer] - 2 wheels;Shower seat;Bedside commode (lift chair - sleeps in that at times) Additional Comments: stays in wheelchair in living room, walks with RW with asssist    Prior Function Level of Independence: Needs assistance   Gait / Transfers Assistance Needed: walks in the house with RW and uses wheelchair alot in the living room. Wheelchair mobility with Modif I, walks with min guard assist per pt preference  ADL's / Homemaking Assistance Needed: Pt needs help with bra and socks.  Sponge bathes upper body on her own but daughter does the lower body.  Pt does light ADls only, pt does some cooking but daughter does most, Can manage meds.   Comments: Pt wants grab bars in shower     Hand Dominance   Dominant Hand: Right    Extremity/Trunk Assessment   Upper Extremity Assessment Upper Extremity Assessment: Defer to OT evaluation     Lower Extremity Assessment Lower Extremity Assessment: Generalized weakness    Cervical / Trunk Assessment Cervical / Trunk Assessment: Normal  Communication   Communication: No difficulties  Cognition Arousal/Alertness: Awake/alert Behavior During Therapy: WFL for tasks assessed/performed Overall Cognitive Status: History of cognitive impairments - at baseline                                        General Comments      Exercises General Exercises - Lower Extremity Ankle Circles/Pumps: AROM;Both;10 reps;Seated Long Arc Quad: AROM;Both;10 reps;Seated   Assessment/Plan    PT Assessment Patient needs continued PT services  PT Problem List Decreased activity tolerance;Decreased balance;Decreased mobility;Decreased knowledge of use of DME;Decreased safety awareness;Decreased knowledge of precautions;Obesity       PT Treatment Interventions DME instruction;Gait training;Functional mobility training;Therapeutic activities;Therapeutic exercise;Balance training;Patient/family education;Wheelchair mobility training    PT Goals (Current goals can be found in the Care Plan section)  Acute Rehab PT Goals Patient Stated Goal: to go home PT Goal Formulation: With patient Time For Goal Achievement: 03/11/20 Potential to Achieve  Goals: Good    Frequency Min 3X/week   Barriers to discharge        Co-evaluation               AM-PAC PT "6 Clicks" Mobility  Outcome Measure Help needed turning from your back to your side while in a flat bed without using bedrails?: A Little Help needed moving from lying on your back to sitting on the side of a flat bed without using bedrails?: A Little Help needed moving to and from a bed to a chair (including a wheelchair)?: A Lot Help needed standing up from a chair using your arms (e.g., wheelchair or bedside chair)?: A Little Help needed to walk in hospital room?: Total Help needed climbing 3-5 steps with a railing? :  Total 6 Click Score: 13    End of Session Equipment Utilized During Treatment: Gait belt Activity Tolerance: Patient limited by fatigue Patient left: in chair;with call bell/phone within reach;with chair alarm set Nurse Communication: Mobility status PT Visit Diagnosis: Unsteadiness on feet (R26.81);Muscle weakness (generalized) (M62.81)    Time: 3614-4315 PT Time Calculation (min) (ACUTE ONLY): 31 min   Charges:   PT Evaluation $PT Eval Moderate Complexity: 1 Mod PT Treatments $Therapeutic Activity: 8-22 mins        Audon Heymann W,PT Acute Rehabilitation Services Pager:  [phone]  Office:  [place] 02/26/2020, 2:23 PM

## 2020-02-26 NOTE — Progress Notes (Signed)
Mobility Specialist: Progress Note   02/26/20 1559  Mobility  Activity Sat and stood x 3  Level of Assistance Contact guard assist, steadying assist  Assistive Device Front wheel walker  Mobility Response Tolerated well  Mobility performed by Mobility specialist  Bed Position Chair  $Mobility charge 1 Mobility   Pre-Mobility: 80 HR, 143/53 BP, 100% SpO2 Post-Mobility: 86 HR, 158/68 BP, 96% SpO2  Pt sat and stood 3x from chair. Pt stood for 30 seconds the first two stands and 1 minute and 20 seconds on the last stand. Pt c/o of pain she rated 6/10, RN notified.   [facility] Adanya Sosinski Mobility Specialist

## 2020-02-26 NOTE — Progress Notes (Signed)
[facility] Progress Note   OP HD:MWF [facility] 4h per pt, edw 105.5kg ?  Assessment/ Plan:   1. L foot infection /cellulitis/ gangrene - started IV abxvanc/ zosyn, VVSsuccessful revasc by Dr. Carlis Abbott on 10/27 and lt TMA 10/28 2. ESRD - on HD MWF.  HDFri with net UF 2L  No absolute indication for RRT today; plan next HD Monday  on  MWF  regimen  3. BP / volume - cont home BP meds, no gross vol excess.  4. Anemia ckd -Hb8.3  - check iron panel, dose Aranesp 150 Monday with dialysis  5. MBD ckd - cont binder; recheck phos in the AM (only 2.5 on 9/2). Phos 5.1 10/29. Continue current regimen 6. DM2 - on insulin  Subjective:   Denies f/c/n/v/ CP/ dyspnea; pain well controlled   Objective:   BP 111/70 (BP Location: Left Arm)   Pulse 79   Temp 98.3 F (36.8 C) (Oral)   Resp 14   Ht 5\' 4"  (1.626 m)   Wt 105.4 kg   LMP  (LMP Unknown)   SpO2 100%   BMI 39.89 kg/m   Intake/Output Summary (Last 24 hours) at 02/26/2020 1032 Last data filed at 02/25/2020 1446 Gross per 24 hour  Intake --  Output 2000 ml  Net -2000 ml   Weight change: 2.4 kg  Physical Exam: Genalert, no distress No jvd or bruits Chest clear bilat RRR no MRG Abd soft ntnd no mass or ascites +bsobese ExtL TMA>Rtoe dry gangrene Neuro is alert, Ox 3 , nf RUA AVG+bruit  Imaging: No results found.  Labs: BMET Recent Labs  Lab 02/22/20 1152 02/23/20 0049 02/25/20 0111 02/26/20 0258  NA 137 137 135 137  K 3.7 3.8 4.2 4.1  CL 99 101 96* 99  CO2 26 24 27 26   GLUCOSE 79 99 136* 136*  BUN 18 22 23 13   CREATININE 6.76* 7.56* 7.46* 5.37*  CALCIUM 8.4* 8.4* 8.0* 8.1*  PHOS  --   --  5.1*  --    CBC Recent Labs  Lab 02/22/20 1152 02/22/20 1152 02/23/20 0049 02/25/20 0111 02/25/20 1818 02/26/20 0258  WBC 17.2*  --  17.1* 18.7*  --  21.6*  NEUTROABS 12.8*  --   --  14.3*  --  17.1*  HGB 8.2*   < > 7.6* 6.2* 9.1* 8.3*  HCT 27.3*   < > 25.2* 20.5* 27.5* 26.1*   MCV 85.0  --  84.3 84.4  --  85.0  PLT 640*  --  592* 564*  --  524*   < > = values in this interval not displayed.    Medications:    . sodium chloride   Intravenous Once  . acetaminophen  650 mg Oral Once  . amLODipine  10 mg Oral QHS  . aspirin EC  81 mg Oral Daily  . atorvastatin  40 mg Oral QHS  . Chlorhexidine Gluconate Cloth  6 each Topical Q0600  . clopidogrel  75 mg Oral QHS  . diphenhydrAMINE  25 mg Intravenous Once  . heparin  5,000 Units Subcutaneous Q12H  . insulin aspart  0-15 Units Subcutaneous TID WC  . insulin aspart  0-5 Units Subcutaneous QHS  . insulin glargine  55 Units Subcutaneous Daily  . metoprolol succinate  100 mg Oral QHS  . multivitamin  1 tablet Oral QHS  . sevelamer carbonate  2,400 mg Oral TID WC   And  . sevelamer carbonate  1,600 mg Oral With  snacks  . sodium chloride flush  3 mL Intravenous Q12H  . sodium chloride flush  3 mL Intravenous Q12H      Otelia Santee, MD 02/26/2020, 10:32 AM

## 2020-02-27 DIAGNOSIS — I96 Gangrene, not elsewhere classified: Secondary | ICD-10-CM | POA: Diagnosis not present

## 2020-02-27 DIAGNOSIS — Z992 Dependence on renal dialysis: Secondary | ICD-10-CM | POA: Diagnosis not present

## 2020-02-27 DIAGNOSIS — N186 End stage renal disease: Secondary | ICD-10-CM | POA: Diagnosis not present

## 2020-02-27 LAB — CBC WITH DIFFERENTIAL/PLATELET
Abs Immature Granulocytes: 0.22 10*3/uL — ABNORMAL HIGH (ref 0.00–0.07)
Basophils Absolute: 0.1 10*3/uL (ref 0.0–0.1)
Basophils Relative: 0 %
Eosinophils Absolute: 0.6 10*3/uL — ABNORMAL HIGH (ref 0.0–0.5)
Eosinophils Relative: 3 %
HCT: 24.6 % — ABNORMAL LOW (ref 36.0–46.0)
Hemoglobin: 7.7 g/dL — ABNORMAL LOW (ref 12.0–15.0)
Immature Granulocytes: 1 %
Lymphocytes Relative: 11 %
Lymphs Abs: 2.3 10*3/uL (ref 0.7–4.0)
MCH: 26.9 pg (ref 26.0–34.0)
MCHC: 31.3 g/dL (ref 30.0–36.0)
MCV: 86 fL (ref 80.0–100.0)
Monocytes Absolute: 1.6 10*3/uL — ABNORMAL HIGH (ref 0.1–1.0)
Monocytes Relative: 7 %
Neutro Abs: 17.1 10*3/uL — ABNORMAL HIGH (ref 1.7–7.7)
Neutrophils Relative %: 78 %
Platelets: 528 10*3/uL — ABNORMAL HIGH (ref 150–400)
RBC: 2.86 MIL/uL — ABNORMAL LOW (ref 3.87–5.11)
RDW: 15.4 % (ref 11.5–15.5)
WBC: 21.9 10*3/uL — ABNORMAL HIGH (ref 4.0–10.5)
nRBC: 0 % (ref 0.0–0.2)

## 2020-02-27 LAB — CULTURE, BLOOD (ROUTINE X 2): Culture: NO GROWTH

## 2020-02-27 LAB — GLUCOSE, CAPILLARY
Glucose-Capillary: 199 mg/dL — ABNORMAL HIGH (ref 70–99)
Glucose-Capillary: 124 mg/dL — ABNORMAL HIGH (ref 70–99)
Glucose-Capillary: 94 mg/dL (ref 70–99)
Glucose-Capillary: 235 mg/dL — ABNORMAL HIGH (ref 70–99)

## 2020-02-27 LAB — IRON AND TIBC
Iron: 14 ug/dL — ABNORMAL LOW (ref 28–170)
Saturation Ratios: 14 % (ref 10.4–31.8)
TIBC: 104 ug/dL — ABNORMAL LOW (ref 250–450)
UIBC: 90 ug/dL

## 2020-02-27 LAB — FERRITIN: Ferritin: 962 ng/mL — ABNORMAL HIGH (ref 11–307)

## 2020-02-27 MED ORDER — OXYCODONE-ACETAMINOPHEN 5-325 MG PO TABS: 1.0000 | ORAL_TABLET | Freq: Three times a day (TID) | ORAL | 0 refills | Status: DC | PRN

## 2020-02-27 MED ORDER — AMLODIPINE BESYLATE 10 MG PO TABS: 10.0000 mg | ORAL_TABLET | Freq: Every day | ORAL | 0 refills | Status: DC

## 2020-02-27 MED ORDER — ASPIRIN 81 MG PO TBEC: 81.0000 mg | DELAYED_RELEASE_TABLET | Freq: Every day | ORAL | 0 refills | Status: AC

## 2020-02-27 NOTE — Discharge Summary (Signed)
Physician Discharge Summary  Christy Zhang ZYS:[id number] DOB: [birth year] DOA: 02/22/2020  PCP: Claretta Fraise, MD  Admit date: 02/22/2020 Discharge date: 02/27/2020  Admitted From: Home Disposition: Home  Recommendations for Outpatient Follow-up:  1. Follow up with PCP in 1-2 weeks 2. Follow-up with vascular surgery in 2 weeks. 3. Please obtain BMP/CBC in one week 4. Please follow up with your PCP on the following pending results: Unresulted Labs (From admission, onward)         None       Home Health: Yes Equipment/Devices: Wheelchair  Discharge Condition: Stable CODE STATUS: Full code Diet recommendation: Low-sodium  Subjective: Seen and examined.  She has no complaints.  Pain controlled.  Brief/Interim Summary: Christy Zhang a 64 y.o.femalewith medical history significant ofPVD withchronic right third toe gangrene, ESRD on HD Monday Wednesday Friday, IDDM with insulin resistance, HTN, HLD, presented with left leg rash swelling and toe color changes. Patient developed right third toe gangrene-like changes in summer, when patient was admitted to hospital for groin infection. Patient was treated with Tressie Ellis for both infections,and work-up by vascular surgery showed bilateral third toe gangrene changes angiogram showed on July 22 that severe right tibial single-vessel runoff at mid segment with occlusion just above the ankle, on the left side, there is a severe tibial disease in the AT and peroneal artery heavily calcified with multiple segment high-grade stenosis with increased risk of limb loss.Patient claimed that she has not been following with either vascular or podiatrist since July. But she has been following with wound care center for the right groin wound.  Patient started noticing more discoloration changes of the right 2nd and 4th toe 3 to 4 days prior to admission and gradually she started to notice rash and warmness of the right leg below knee.  patient went to wound care center for wound care of the right groin, wherestaff noticed extensive changes of the left foot concerning for acute infection and sent patient down to ER.  Upon arrival to ED, patient had increased leukocytosis of 17 which seems stable compared to 17.5 on last admission, x-ray showed left foot negative finding regarding acute bony changes. Right 3rd toe showed signs of osteomyelitis.  Vascular surgery was consulted.  Patient was found to have a left third toe gangrene, She underwent lower extremity arteriogram and angioplasty of the left anterior tibial artery on 02/23/2020 and was also found to have occlusion of the right common femoral artery.  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  She was already on Plavix.  Aspirin was added.  She was seen by PT OT who recommended home health with wheelchair which has been ordered for her.  She was cleared by vascular surgery today.  She is being discharged in stable condition on DAPT.  Of note, her blood pressure was elevated for which her Norvasc was increased to 10 mg p.o. and her Toprol-XL was continued at 100 mg p.o. daily.  She is being discharged on increased dose of Norvasc.  She also received her regular dialysis here.  She will follow with her regular dialysis as outpatient, her next dialysis is tomorrow.  Discharge Diagnoses:  Active Problems:   Diabetes ([place])   Anemia in chronic kidney disease   Essential (primary) hypertension   Osteomyelitis of third toe of right foot ([place])   Osteomyelitis ([facility])   Hyperglycemia due to diabetes mellitus ([employer])   Gangrene ([place])    Discharge Instructions   Allergies as of 02/27/2020  Reactions   Sulfa Antibiotics Swelling      Medication List    TAKE these medications   acetaminophen 325 MG tablet Commonly known as: TYLENOL Take 2 tablets (650 mg total) by mouth every 6 (six) hours as needed for mild pain (or Fever >/= 101).   amLODipine 10 MG tablet Commonly known as:  NORVASC Take 1 tablet (10 mg total) by mouth at bedtime. What changed:   medication strength  how much to take   aspirin 81 MG EC tablet Take 1 tablet (81 mg total) by mouth daily. Swallow whole.   atorvastatin 40 MG tablet Commonly known as: LIPITOR Take 1 tablet (40 mg total) by mouth daily. What changed: when to take this   clopidogrel 75 MG tablet Commonly known as: Plavix Take 1 tablet (75 mg total) by mouth daily.   diphenhydrAMINE 25 MG tablet Commonly known as: BENADRYL Take 25 mg by mouth every 6 (six) hours as needed for itching.   metoprolol succinate 100 MG 24 hr tablet Commonly known as: TOPROL-XL TAKE ONE TABLET BY MOUTH DAILY. TAKE WITH OR IMMEDIATELY FOLLOWING A MEAL. What changed:   how much to take  how to take this  when to take this   multivitamin Tabs tablet Take 1 tablet by mouth at bedtime.   oxyCODONE-acetaminophen 5-325 MG tablet Commonly known as: Percocet Take 1 tablet by mouth every 8 (eight) hours as needed for up to 10 doses for severe pain.   Santyl ointment Generic drug: collagenase Apply 1 application topically daily.   sevelamer carbonate 800 MG tablet Commonly known as: RENVELA Take 1,600-2,400 mg by mouth See admin instructions. Take 2400 mg with each meal and 1600 mg with each snack   Toujeo SoloStar 300 UNIT/ML Solostar Pen Generic drug: insulin glargine (1 Unit Dial) Inject 100 Units into the skin in the morning.   Trulicity 1.5 VQ/9.4HW Sopn Generic drug: Dulaglutide Inject 0.5 mLs (1.5 mg total) into the skin once a week. 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  A walker will not resolve  issue with performing  activities of daily living. A wheelchair will allow patient to safely perform daily activities. Patient is not able to propel themselves in the home using a standard weight wheelchair due to weakness. Patient can self propel in the lightweight wheelchair. Length of need lifetime. Accessories: elevating leg rests (ELRs), wheel locks, extensions and anti-tippers.   02/27/20 2800          Follow-up Information    Angelia Mould, MD Follow up.   Specialties: Vascular Surgery, Cardiology Why: Office will call to make arrangements for follow-up (sent) Contact information: [address] [place] [address] [phone]        Claretta Fraise, MD Follow up in 1 week(s).   Specialty: Family Medicine Contact information: [address] [place] [address] [phone]              Allergies  Allergen Reactions  . Sulfa Antibiotics Swelling    Consultations: Vascular surgery, nephrology   Procedures/Studies: PERIPHERAL VASCULAR CATHETERIZATION  Result Date: 02/23/2020 Patient  name: LAVREN LEWAN         MRN: [id number]        DOB: May 24, 1955          Sex: female  02/22/2020 - 02/23/2020 Pre-operative Diagnosis: Critical limb ischemia of the bilateral lower extremities with tissue loss Post-operative diagnosis:  Same Surgeon:  [name], MD Assistant:  Yevonne Aline. Stanford Breed, MD Procedure Performed: 1.  Ultrasound guided access of right common femoral artery 2.  Aortogram including catheter selection of aorta 3.  Bilateral lower extremity arteriogram with selection of third order branches in the left lower extremity 4.  Left anterior tibial artery angioplasty (2.5 mm x 220 mm Coyote) 5.  Mynx closure of the right common femoral artery 6.  67 minutes of monitored moderate conscious sedation time  Indications: Patient is a 64 year old female well-known to the vascular surgery service who has previously undergone a right leg tibial intervention for critical limb ischemia.   She was seen yesterday in consultation by Dr. Scot Dock with gangrene of her left toes as well as gangrene of a right toe as well.  She presents today for planned lower extremity arteriogram and possible intervention with a focus on the left leg.  Findings:  Aortogram showed no flow-limiting stenosis in the aortoiliac segment.  Left lower extremity arteriogram showed a patent common femoral, profunda, SFA, above and below-knee popliteal artery with no flow limiting stenosis.  Patient has severe tibial disease.  Dominant runoff appears to be the anterior tibial that had multifocal high-grade >80% stenosis throughout a long segment over 200 mm.  The peroneal was also patent although was very small and diminutive and had some high-grade stenosis proximally.  The posterior tibial occluded shortly after takeoff from the TP trunk.  The left anterior tibial artery disease was crossed antegrade and the entire anterior tibial was angioplastied with a 2.5 mm Coyote.  No evidence of residual stenosis and no dissection.  She now has inline flow down the left lower extremity with a patent AT with flow into the foot.  Right lower extremity runoff  was obtained at the end of the case and this shows a patent common femoral profunda SFA popliteal and inline flow through the anterior tibial although it does have diffuse moderate disease.             Procedure:  The patient was identified in the holding area and taken to room 8.  The patient was then placed supine on the table and prepped and draped in the usual sterile fashion.  A time out was called.  Ultrasound was used to evaluate the right common femoral artery.  It was patent .  A digital ultrasound image was acquired.  A micropuncture needle was used to access the right common femoral artery under ultrasound guidance.  An 018 wire was advanced without resistance and a micropuncture sheath was placed.  The 018 wire was removed and a benson wire was placed.  The micropuncture  sheath was exchanged for a 5 french sheath.  An omniflush catheter was advanced over the wire to the level of L-1.  An abdominal angiogram was obtained.  Next, using the omniflush catheter and a benson wire, the aortic bifurcation was crossed and the catheter was placed into theleft external iliac artery and left runoff was obtained.  Only decision was made to intervene.  Ultimately the aortic bifurcation was crossed with a Omni Flush catheter and Glidewire advantage.  Once the wire was advanced into the SFA  a long 6 French 90 cm shuttle sheath was placed.  Patient was given 100 units per kilogram heparin.  The anterior tibial artery was then cannulated with an 0.014 Zilient wire and the lesion was crossed all the way into the ankle and confirmed on hand-injection that we were in the true lumen.  The entire AT was then angioplastied with a 2.5 mm Coyote throughout its entire length for 2 minutes for a prolonged inflation time.  No evidence of residual stenosis or dissection with inline flow down the left lower extremity through the anterior tibial into the foot.  That point in time wires and catheters were removed.  Retrograde runoff in the right groin sheath was then obtained to evaluate the right leg and the anterior tibial intervention remains patent although diffusely diseased.  Mynx closure was deployed in the right groin.  Taken to holding in stable condition.  Plan: Patient will continue dual antiplatelet therapy.  Should will be posted for left TMA tomorrow.  [name], MD [facility] Office: [phone]  DG Foot Complete Left  Result Date: 02/22/2020 CLINICAL DATA:  Infection EXAM: LEFT FOOT - COMPLETE 3+ VIEW COMPARISON:  None. FINDINGS: Decreased osseous mineralization. No erosive changes identified. Distal phalanges are not as well evaluated due to flexion and overlap. 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 Extensive vascular calcification.  IMPRESSION: No radiographic evidence of osteomyelitis. Distal phalanges are not as well evaluated. Electronically Signed   By: Macy Mis M.D.   On: 02/22/2020 12:31   DG Foot Complete Right  Result Date: 02/22/2020 CLINICAL DATA:  Evaluate for osteomyelitis EXAM: RIGHT FOOT COMPLETE - 3+ VIEW COMPARISON:  None. FINDINGS: Amputation of the second distal phalanx. Attenuation of the distal half of the third distal phalanx which may reflect prior partial amputation versus osteomyelitis. No acute fracture or dislocation. Old healed fracture of the base of the fifth metatarsal. Generalized osteopenia. Severe arthropathy and malalignment of the midfoot as can be seen with a Charcot joint. No aggressive osseous lesion. Soft tissue are unremarkable. No radiopaque foreign body or soft tissue emphysema. Peripheral vascular atherosclerotic disease. IMPRESSION: 1. Amputation of the second distal phalanx. 2. Attenuation of the distal half of the third distal phalanx which may reflect prior partial amputation versus osteomyelitis. Electronically Signed   By: Kathreen Devoid   On: 02/22/2020 13:42      Discharge Exam: Vitals:   02/27/20 0500 02/27/20 0744  BP: (!) 133/55 (!) 144/51  Pulse: 78 81  Resp: 16 17  Temp: 98.9 F (37.2 C) 98.6 F (37 C)  SpO2: 92% 96%   Vitals:   02/26/20 1826 02/26/20 2150 02/27/20 0500 02/27/20 0744  BP: (!) 135/55 (!) 148/46 (!) 133/55 (!) 144/51  Pulse: 79 79 78 81  Resp: 18 16 16 17   Temp: 98.2 F (36.8 C) 98.6 F (37 C) 98.9 F (37.2 C) 98.6 F (37 C)  TempSrc: Oral Oral Oral Oral  SpO2: 96% 95% 92% 96%  Weight:      Height:        General: Pt is alert, awake, not in acute distress Cardiovascular: RRR, S1/S2 +, no rubs, no gallops Respiratory: CTA bilaterally, no wheezing, no rhonchi Abdominal: Soft, NT, ND, bowel sounds + Extremities: no edema, no cyanosis.  Dressing in left foot.    The results of significant diagnostics from this hospitalization  (including imaging, microbiology, ancillary and laboratory) are listed below for reference.     Microbiology: Recent Results (from  the past 240 hour(s))  Blood culture (routine x 2)     Status: None (Preliminary result)   Collection Time: 02/22/20  1:00 PM   Specimen: BLOOD RIGHT ARM  Result Value Ref Range Status   Specimen Description BLOOD RIGHT ARM  Final   Special Requests   Final    BOTTLES DRAWN AEROBIC AND ANAEROBIC Blood Culture results may not be optimal due to an inadequate volume of blood received in culture bottles   Culture   Final    NO GROWTH 4 DAYS Performed at [facility], [place] [address], [place], [place] [address]    Report Status PENDING  Incomplete  Respiratory Panel by RT PCR (Flu A&B, Covid) - Nasopharyngeal Swab     Status: None   Collection Time: 02/22/20  3:37 PM   Specimen: Nasopharyngeal Swab  Result Value Ref Range Status   SARS Coronavirus 2 by RT PCR NEGATIVE NEGATIVE Final    Comment: (NOTE) SARS-CoV-2 target nucleic acids are NOT DETECTED.  The SARS-CoV-2 RNA is generally detectable in upper respiratoy specimens during the acute phase of infection. The lowest concentration of SARS-CoV-2 viral copies this assay can detect is 131 copies/mL. A negative result does not preclude SARS-Cov-2 infection and should not be used as the sole basis for treatment or other patient management decisions. A negative result may occur with  improper specimen collection/handling, submission of specimen other than nasopharyngeal swab, presence of viral mutation(s) within the areas targeted by this assay, and inadequate number of viral copies (<131 copies/mL). A negative result must be combined with clinical observations, patient history, and epidemiological information. The expected result is Negative.  Fact Sheet for Patients:  PinkCheek.be  Fact Sheet for Healthcare Providers:  GravelBags.it  This  test is no t yet approved or cleared by the [place] FDA and  has been authorized for detection and/or diagnosis of SARS-CoV-2 by FDA under an Emergency Use Authorization (EUA). This EUA will remain  in effect (meaning this test can be used) for the duration of the COVID-19 declaration under Section 564(b)(1) of the Act, 21 U.S.C. section 360bbb-3(b)(1), unless the authorization is terminated or revoked sooner.     Influenza A by PCR NEGATIVE NEGATIVE Final   Influenza B by PCR NEGATIVE NEGATIVE Final    Comment: (NOTE) The Xpert Xpress SARS-CoV-2/FLU/RSV assay is intended as an aid in  the diagnosis of influenza from Nasopharyngeal swab specimens and  should not be used as a sole basis for treatment. Nasal washings and  aspirates are unacceptable for Xpert Xpress SARS-CoV-2/FLU/RSV  testing.  Fact Sheet for Patients: PinkCheek.be  Fact Sheet for Healthcare Providers: GravelBags.it  This test is not yet approved or cleared by the [place] FDA and  has been authorized for detection and/or diagnosis of SARS-CoV-2 by  FDA under an Emergency Use Authorization (EUA). This EUA will remain  in effect (meaning this test can be used) for the duration of the  Covid-19 declaration under Section 564(b)(1) of the Act, 21  U.S.C. section 360bbb-3(b)(1), unless the authorization is  terminated or revoked. Performed at [facility], [place] [address] [place], [place] [address]   Surgical pcr screen     Status: None   Collection Time: 02/24/20 10:26 AM   Specimen: Nasal Mucosa; Nasal Swab  Result Value Ref Range Status   MRSA, PCR NEGATIVE NEGATIVE Final   Staphylococcus aureus NEGATIVE NEGATIVE Final    Comment: (NOTE) The Xpert SA Assay (FDA approved for NASAL  specimens in patients 22 years of age and older), is one component of a comprehensive surveillance program. It is not intended to diagnose infection nor  to guide or monitor treatment. Performed at [facility], [place] [address], [place], [place] [address]      Labs: BNP (last 3 results) No results for input(s): BNP in the last 8760 hours. Basic Metabolic Panel: Recent Labs  Lab 02/22/20 1152 02/23/20 0049 02/25/20 0111 02/26/20 0258  NA 137 137 135 137  K 3.7 3.8 4.2 4.1  CL 99 101 96* 99  CO2 26 24 27 26   GLUCOSE 79 99 136* 136*  BUN 18 22 23 13   CREATININE 6.76* 7.56* 7.46* 5.37*  CALCIUM 8.4* 8.4* 8.0* 8.1*  PHOS  --   --  5.1*  --    Liver Function Tests: Recent Labs  Lab 02/22/20 1152  AST 9*  ALT 8  ALKPHOS 58  BILITOT 0.5  PROT 7.2  ALBUMIN 2.4*   No results for input(s): LIPASE, AMYLASE in the last 168 hours. No results for input(s): AMMONIA in the last 168 hours. CBC: Recent Labs  Lab 02/22/20 1152 02/22/20 1152 02/23/20 0049 02/25/20 0111 02/25/20 1818 02/26/20 0258 02/27/20 0317  WBC 17.2*  --  17.1* 18.7*  --  21.6* 21.9*  NEUTROABS 12.8*  --   --  14.3*  --  17.1* 17.1*  HGB 8.2*   < > 7.6* 6.2* 9.1* 8.3* 7.7*  HCT 27.3*   < > 25.2* 20.5* 27.5* 26.1* 24.6*  MCV 85.0  --  84.3 84.4  --  85.0 86.0  PLT 640*  --  592* 564*  --  524* 528*   < > = values in this interval not displayed.   Cardiac Enzymes: No results for input(s): CKTOTAL, CKMB, CKMBINDEX, TROPONINI in the last 168 hours. BNP: Invalid input(s): POCBNP CBG: Recent Labs  Lab 02/26/20 0549 02/26/20 1306 02/26/20 1749 02/26/20 2142 02/27/20 0554  GLUCAP 157* 199* 124* 122* 94   D-Dimer No results for input(s): DDIMER in the last 72 hours. Hgb A1c No results for input(s): HGBA1C in the last 72 hours. Lipid Profile No results for input(s): CHOL, HDL, LDLCALC, TRIG, CHOLHDL, LDLDIRECT in the last 72 hours. Thyroid function studies No results for input(s): TSH, T4TOTAL, T3FREE, THYROIDAB in the last 72 hours.  Invalid input(s): FREET3 Anemia work up Recent Labs    02/27/20 0317  FERRITIN 962*  TIBC 104*  IRON  14*   Urinalysis No results found for: COLORURINE, APPEARANCEUR, [place], [place], [place], [place], [place], [place], [place], UROBILINOGEN, NITRITE, LEUKOCYTESUR Sepsis Labs Invalid input(s): PROCALCITONIN,  WBC,  LACTICIDVEN Microbiology Recent Results (from the past 240 hour(s))  Blood culture (routine x 2)     Status: None (Preliminary result)   Collection Time: 02/22/20  1:00 PM   Specimen: BLOOD RIGHT ARM  Result Value Ref Range Status   Specimen Description BLOOD RIGHT ARM  Final   Special Requests   Final    BOTTLES DRAWN AEROBIC AND ANAEROBIC Blood Culture results may not be optimal due to an inadequate volume of blood received in culture bottles   Culture   Final    NO GROWTH 4 DAYS Performed at [facility], [place] [address] [place], [place] [address]    Report Status PENDING  Incomplete  Respiratory Panel by RT PCR (Flu A&B, Covid) - Nasopharyngeal Swab     Status: None   Collection Time: 02/22/20  3:37 PM   Specimen: Nasopharyngeal  Swab  Result Value Ref Range Status   SARS Coronavirus 2 by RT PCR NEGATIVE NEGATIVE Final    Comment: (NOTE) SARS-CoV-2 target nucleic acids are NOT DETECTED.  The SARS-CoV-2 RNA is generally detectable in upper respiratoy specimens during the acute phase of infection. The lowest concentration of SARS-CoV-2 viral copies this assay can detect is 131 copies/mL. A negative result does not preclude SARS-Cov-2 infection and should not be used as the sole basis for treatment or other patient management decisions. A negative result may occur with  improper specimen collection/handling, submission of specimen other than nasopharyngeal swab, presence of viral mutation(s) within the areas targeted by this assay, and inadequate number of viral copies (<131 copies/mL). A negative result must be combined with clinical observations, patient history, and epidemiological information. The expected result is Negative.  Fact Sheet for  Patients:  PinkCheek.be  Fact Sheet for Healthcare Providers:  GravelBags.it  This test is no t yet approved or cleared by the [place] FDA and  has been authorized for detection and/or diagnosis of SARS-CoV-2 by FDA under an Emergency Use Authorization (EUA). This EUA will remain  in effect (meaning this test can be used) for the duration of the COVID-19 declaration under Section 564(b)(1) of the Act, 21 U.S.C. section 360bbb-3(b)(1), unless the authorization is terminated or revoked sooner.     Influenza A by PCR NEGATIVE NEGATIVE Final   Influenza B by PCR NEGATIVE NEGATIVE Final    Comment: (NOTE) The Xpert Xpress SARS-CoV-2/FLU/RSV assay is intended as an aid in  the diagnosis of influenza from Nasopharyngeal swab specimens and  should not be used as a sole basis for treatment. Nasal washings and  aspirates are unacceptable for Xpert Xpress SARS-CoV-2/FLU/RSV  testing.  Fact Sheet for Patients: PinkCheek.be  Fact Sheet for Healthcare Providers: GravelBags.it  This test is not yet approved or cleared by the [place] FDA and  has been authorized for detection and/or diagnosis of SARS-CoV-2 by  FDA under an Emergency Use Authorization (EUA). This EUA will remain  in effect (meaning this test can be used) for the duration of the  Covid-19 declaration under Section 564(b)(1) of the Act, 21  U.S.C. section 360bbb-3(b)(1), unless the authorization is  terminated or revoked. Performed at [facility], [place] [address], [place], [place] [address]   Surgical pcr screen     Status: None   Collection Time: 02/24/20 10:26 AM   Specimen: Nasal Mucosa; Nasal Swab  Result Value Ref Range Status   MRSA, PCR NEGATIVE NEGATIVE Final   Staphylococcus aureus NEGATIVE NEGATIVE Final    Comment: (NOTE) The Xpert SA Assay (FDA approved for NASAL specimens in  patients 58 years of age and older), is one component of a comprehensive surveillance program. It is not intended to diagnose infection nor to guide or monitor treatment. Performed at [facility], [place] [address], [place], [place] [address]      Time coordinating discharge: Over 30 minutes  SIGNED:   Darliss Cheney, MD  [facility] 02/27/2020, 9:10 AM  If 7PM-7AM, please contact night-coverage [email or web address]

## 2020-02-27 NOTE — Progress Notes (Signed)
Mobility Specialist: Progress Note   02/27/20 1302  Mobility  Activity Refused mobility   Pt states she has been up a couple times and walked to the bedside commode and that someone else had come in and worked with her today.   [facility] Mobility Specialist

## 2020-02-27 NOTE — Progress Notes (Signed)
Pt discharged today to home with family.  Pt's IV removed.  Pt taken off telemetry and CCMD notified.  Pt left with all of their personal belongings.  AVS documentation reviewed with Pt and all questions answered.  Pt left with 3-in-1.

## 2020-02-27 NOTE — Care Management (Cosign Needed)
    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  A walker will not resolve  issue with performing activities of daily living. A wheelchair will allow patient to safely perform daily activities. Patient is not able to propel themselves in the home using a standard weight wheelchair due to weakness. Patient can self propel in the lightweight wheelchair. Length of need lifetime. Accessories: elevating leg rests (ELRs), wheel locks, extensions and anti-tippers.   02/27/20 0932

## 2020-02-27 NOTE — Progress Notes (Signed)
Occupational Therapy Evaluation Patient Details Name: Christy Zhang MRN: [id number] DOB: [birth year] Today's Date: 02/27/2020    History of Present Illness Christy RYBACKI a 64 y.o.femalewith medical history significant ofPVD withchronic right third toe gangrene, ESRD on HD Monday Wednesday Friday, IDDM with insulin resistance, HTN, HLD, presented with left leg rash swelling and toe color changes. Patient developed right third toe gangrene-like changes in summer, when patient was admitted to hospital for groin infection. Patient was treated with Tressie Ellis for both infections,and work-up by vascular surgery showed bilateral third toe gangrene changes angiogram showed on July 22 that severe right tibial single-vessel runoff at mid segment with occlusion just above the ankle, on the left side, there is a severe tibial disease in the AT and peroneal artery heavily calcified with multiple segment high-grade stenosis with increased risk of limb loss.Patient claimed that she has not been following with either vascular or podiatrist since July. But she has been following with wound care center for the right groin wound.Patient started noticing more discoloration changes of the right 2nd and 4th toe 3 to 4 days prior to admission and gradually she started to notice rash and warmness of the right leg below knee. patient went to wound care center for wound care of the right groin, where staff noticed extensive changes of the left foot concerning for acute infection and sent patient down to ER.Vascular surgery was consulted.  Patient was found to have a left third toe gangrene, She underwent lower extremity arteriogram and angioplasty of the left anterior tibial artery on 02/23/2020 and was also found to have occlusion of the right common femoral artery.  Underwent left TMA on 02/24/2020 and then underwent a left transmetatarsal amputation on 02/24/2020.   Clinical Impression   Prior to hospitalization,  pt was Mod I with functional mobility using RW/wheelchair and required assistance with self-care tasks (specifically LB dressing, LB bathing). Pt managed her own medications but depended on her daughter for all other IADLs including driving. Today, pt received semi-reclined in bed, pt agreeable to OT eval. Pt is currently exhibiting increased pain and decreased activity tolerance, ROM, strength, balance, endurance. Pt was Mod I for bed mobility, min guard for functional mobility/transfers (bed>BSC>chair) using RW, total assist for LB self-care, setup for UB self-care. Pt has LLE post-op shoe in room, donned during session. OT educated pt on LLE partial weight bearing precaution (heel only), safe functional transferring methods, fall prevention, activity pacing, and potential use for AE/DME. Pt's daughter can provide 24/7 S/A at d/c. Care team plans on discharging pt home this afternoon. Recommend 24/7 S/A with HHOT. Pt would benefit from continued skilled acute care OT services to maximize independence with ADLs/ADL mobility, will continue to follow acutely as able.     Follow Up Recommendations  Home health OT;Supervision/Assistance - 24 hour    Equipment Recommendations  3 in 1 bedside commode (has wheelchair and RW already; wants grab bars)    Recommendations for Other Services  (None)     Precautions / Restrictions Precautions Precautions: Fall Restrictions Weight Bearing Restrictions: Yes LLE Weight Bearing: Partial weight bearing      Mobility Bed Mobility Overal bed mobility: Modified Independent Bed Mobility: Supine to Sit     Supine to sit: Modified independent (Device/Increase time)     General bed mobility comments: utilizes bed railing    Transfers Overall transfer level: Needs assistance Equipment used: Rolling walker (2 wheeled) Transfers: Sit to/from [facility] Sit to Stand: Min guard Stand  pivot transfers: Min guard (using RW)       General  transfer comment: min guard for sit>stand from bed level and stand pivot transfer from bed>BSC>chair using RW    Balance Overall balance assessment: Needs assistance Sitting-balance support: No upper extremity supported;Feet supported Sitting balance-Leahy Scale: Good     Standing balance support: Bilateral upper extremity supported;During functional activity Standing balance-Leahy Scale: Fair Standing balance comment: relies on UE support on RW       ADL either performed or assessed with clinical judgement   ADL Overall ADL's : Needs assistance/impaired Eating/Feeding: Independent;Bed level Eating/Feeding Details (indicate cue type and reason): finishing breakfast in bed upon arrival Grooming: Wash/dry face;Set up;Sitting Grooming Details (indicate cue type and reason): washed face sitting in chair Upper Body Bathing: Minimal assistance;Sitting Upper Body Bathing Details (indicate cue type and reason): assistance with back Lower Body Bathing: Moderate assistance;Sitting/lateral leans;Sit to/from stand   Upper Body Dressing : Modified independent;Sitting   Lower Body Dressing: Maximal assistance;Sitting/lateral leans;Sit to/from stand Lower Body Dressing Details (indicate cue type and reason): total for L foot post-op shoe, mod I for slipping on R shoe Toilet Transfer: Supervision/safety;Min guard;Stand-pivot;BSC;RW   Toileting- Clothing Manipulation and Hygiene: Total assistance;Sit to/from stand;Supervision/safety Toileting - Clothing Manipulation Details (indicate cue type and reason): total assist for perianal care standing in front of BSC using RW for support Tub/ Shower Transfer:  (did not assess)   Functional mobility during ADLs: Min guard;Supervision/safety;Rolling walker (for stand pivot transfer from bed>BSC>chair using RW)       Vision Baseline Vision/History: No visual deficits (reading glasses) Patient Visual Report: No change from baseline     Pertinent  Vitals/Pain Pain Assessment: 0-10 Pain Score: 1  Pain Location: left LE  Pain Descriptors / Indicators: Aching Pain Intervention(s): Monitored during session;Repositioned     Hand Dominance Right   Extremity/Trunk Assessment Upper Extremity Assessment Upper Extremity Assessment: Overall WFL for tasks assessed   Lower Extremity Assessment Lower Extremity Assessment: LLE deficits/detail LLE Deficits / Details: s/p 3rd toe amputation, ace wrapping in place, unable to fully assess, partial weight bearing with heel only LLE:  (unable to fully assess d/t ace wrappings) LLE Sensation: history of peripheral neuropathy LLE Coordination:  (unable to fully assess 2/2 dressings)   Cervical / Trunk Assessment Cervical / Trunk Assessment: Normal   Communication Communication Communication: No difficulties   Cognition Arousal/Alertness: Awake/alert Behavior During Therapy: WFL for tasks assessed/performed Overall Cognitive Status: History of cognitive impairments - at baseline      General Comments  skin intact except incision noted to groin, no edema, ace wrapping to LLE foot, sleeps in [place] expects to be discharged to:: Private residence Living Arrangements: Children;Other relatives (daughter and grandbabies) Available Help at Discharge: Family;Available 24 hours/day Type of Home: House Home Access: Ramped entrance     Home Layout: Two level;Able to live on main level with bedroom/bathroom     Bathroom Shower/Tub: Tub/shower unit;Other (comment) (typically sponge bathes)   Bathroom Toilet: Handicapped height Bathroom Accessibility: Yes How Accessible: Accessible via walker Home Equipment: [place] - 2 wheels;Tub bench;Hand held shower head;Wheelchair - manual (getting grab bars soon)   Additional Comments: stays in wheelchair in living room, walks with RW with asssist      Prior Functioning/Environment Level of Independence: Needs  assistance  Gait / Transfers Assistance Needed: walks in the house with RW and uses wheelchair alot in the living room. Wheelchair mobility with Modif I, walks  with min guard assist per pt preference ADL's / Homemaking Assistance Needed: Pt needs help with bra and socks.  Sponge bathes upper body on her own but daughter does the lower body.  Pt does light ADls only, pt does some cooking but daughter does most, Can manage meds.    Comments: Pt wants grab bars in shower and educated on long handled sponge        OT Problem List: Decreased strength;Decreased range of motion;Decreased activity tolerance;Impaired balance (sitting and/or standing);Decreased knowledge of use of DME or AE;Decreased knowledge of precautions;Impaired sensation;Pain      OT Treatment/Interventions: Self-care/ADL training;Therapeutic exercise;Energy conservation;DME and/or AE instruction;Neuromuscular education;Therapeutic activities;Patient/family education    OT Goals(Current goals can be found in the care plan section) Acute Rehab OT Goals Patient Stated Goal: to go home OT Goal Formulation: With patient Time For Goal Achievement: 03/12/20 Potential to Achieve Goals: Good  OT Frequency: Min 2X/week    AM-PAC OT "6 Clicks" Daily Activity     Outcome Measure Help from another person eating meals?: None Help from another person taking care of personal grooming?: A Little Help from another person toileting, which includes using toliet, bedpan, or urinal?: Total Help from another person bathing (including washing, rinsing, drying)?: A Lot Help from another person to put on and taking off regular upper body clothing?: A Little Help from another person to put on and taking off regular lower body clothing?: A Lot 6 Click Score: 15   End of Session Equipment Utilized During Treatment: Gait belt;Rolling walker Nurse Communication: Mobility status;Weight bearing status  Activity Tolerance: Patient tolerated treatment  well Patient left: in chair;with call bell/phone within reach  OT Visit Diagnosis: Unsteadiness on feet (R26.81);Muscle weakness (generalized) (M62.81);Pain Pain - Right/Left: Left Pain - part of body: Ankle and joints of foot                Time: 4650-3546 OT Time Calculation (min): 55 min Charges:  OT General Charges $OT Visit: 1 Visit OT Evaluation $OT Eval Moderate Complexity: 1 Mod OT Treatments $Self Care/Home Management : 8-22 mins $Therapeutic Activity: 8-22 mins  Michel Bickers, OTR/L [facility] [phone]  Francesca Jewett 02/27/2020, 10:04 AM

## 2020-02-27 NOTE — Progress Notes (Signed)
[facility] Progress Note   OP HD:MWF [facility] 4h per pt, edw 105.5kg ?  Assessment/ Plan:   1. L foot infection /cellulitis/ gangrene - started IV abxvanc/ zosyn, VVSsuccessful revasc by Dr. Carlis Abbott on 10/27 and lt TMA 10/28 2. ESRD - on HD MWF.  HDFri with net UF 2L  No absolute indication for RRT today; plan next HD Monday  on  MWF  regimen if she is still here but appears stable for d/c.  3. BP / volume - cont home BP meds, no gross vol excess.  4. Anemia ckd -Hb8.3  - check iron panel, dose Aranesp 150 Monday with dialysis  5. MBD ckd - cont binder; recheck phos in the AM (only 2.5 on 9/2). Phos 5.1 10/29. Continue current regimen 6. DM2 - on insulin  Subjective:   Denies f/c/n/v/ CP/ dyspnea; some pain in the foot but overall pain well controlled   Objective:   BP (!) 144/51 (BP Location: Left Arm)   Pulse 81   Temp 98.6 F (37 C) (Oral)   Resp 17   Ht 5\' 4"  (1.626 m)   Wt 105.4 kg   LMP  (LMP Unknown)   SpO2 96%   BMI 39.89 kg/m  No intake or output data in the 24 hours ending 02/27/20 0954 Weight change:   Physical Exam: Genalert, no distress No jvd or bruits Chest clear bilat RRR no MRG Abd soft ntnd no mass or ascites +bsobese ExtL TMA>Rtoe dry gangrene Neuro is alert, Ox 3 , nf RUA AVG+pulse  Imaging: No results found.  Labs: BMET Recent Labs  Lab 02/22/20 1152 02/23/20 0049 02/25/20 0111 02/26/20 0258  NA 137 137 135 137  K 3.7 3.8 4.2 4.1  CL 99 101 96* 99  CO2 26 24 27 26   GLUCOSE 79 99 136* 136*  BUN 18 22 23 13   CREATININE 6.76* 7.56* 7.46* 5.37*  CALCIUM 8.4* 8.4* 8.0* 8.1*  PHOS  --   --  5.1*  --    CBC Recent Labs  Lab 02/22/20 1152 02/22/20 1152 02/23/20 0049 02/23/20 0049 02/25/20 0111 02/25/20 1818 02/26/20 0258 02/27/20 0317  WBC 17.2*   < > 17.1*  --  18.7*  --  21.6* 21.9*  NEUTROABS 12.8*  --   --   --  14.3*  --  17.1* 17.1*  HGB 8.2*   < > 7.6*   < > 6.2* 9.1* 8.3*  7.7*  HCT 27.3*   < > 25.2*   < > 20.5* 27.5* 26.1* 24.6*  MCV 85.0   < > 84.3  --  84.4  --  85.0 86.0  PLT 640*   < > 592*  --  564*  --  524* 528*   < > = values in this interval not displayed.    Medications:    . sodium chloride   Intravenous Once  . acetaminophen  650 mg Oral Once  . amLODipine  10 mg Oral QHS  . aspirin EC  81 mg Oral Daily  . atorvastatin  40 mg Oral QHS  . Chlorhexidine Gluconate Cloth  6 each Topical Q0600  . clopidogrel  75 mg Oral QHS  . diphenhydrAMINE  25 mg Intravenous Once  . heparin  5,000 Units Subcutaneous Q12H  . insulin aspart  0-15 Units Subcutaneous TID WC  . insulin aspart  0-5 Units Subcutaneous QHS  . insulin glargine  55 Units Subcutaneous Daily  . metoprolol succinate  100 mg Oral QHS  .  multivitamin  1 tablet Oral QHS  . sevelamer carbonate  2,400 mg Oral TID WC   And  . sevelamer carbonate  1,600 mg Oral With snacks  . sodium chloride flush  3 mL Intravenous Q12H  . sodium chloride flush  3 mL Intravenous Q12H      Otelia Santee, MD 02/27/2020, 9:54 AM

## 2020-02-27 NOTE — Discharge Instructions (Signed)
Toe Amputation Toe amputation is a surgical procedure to remove all or part of a toe. A person may have this procedure if:  Tissue in the toe is dying because of poor blood supply.  There is a severe infection in the toe.  There is a cancerous tumor in the toe.  There is a traumatic injury in the toe. Removing the toe keeps nearby tissue healthy. If the toe is infected, removing it helps to keep the infection from spreading. Tell a health care provider about:  Any allergies you have.  All medicines you are taking, including vitamins, herbs, eye drops, creams, and over-the-counter medicines.  Any problems you or family members have had with anesthetic medicines.  Any blood disorders you have.  Any surgeries you have had.  Any medical conditions you have or have had.  Whether you are pregnant or may be pregnant. What are the risks? Generally, this is a safe procedure. However, problems may occur, including:  Bleeding.  Buildup of blood and fluid (hematoma).  Infection.  Allergic reactions to medicines.  Tissue death in the flap of skin (flap necrosis).  Trouble with healing.  Minor changes in the way that you walk (your gait).  Feeling pain in the area that was removed (phantom pain). This is rare. What happens before the procedure? Staying hydrated Follow instructions from your health care provider about hydration, which may include:  Up to 2 hours before the procedure - you may continue to drink clear liquids, such as water, clear fruit juice, black coffee, and plain tea.  Eating and drinking restrictions Follow instructions from your health care provider about eating and drinking, which may include:  8 hours before the procedure - stop eating heavy meals or foods, such as meat, fried foods, or fatty foods.  6 hours before the procedure - stop eating light meals or foods, such as toast or cereal.  6 hours before the procedure - stop drinking milk or drinks  that contain milk.  2 hours before the procedure - stop drinking clear liquids. Medicines Ask your health care provider about:  Changing or stopping your regular medicines. This is especially important if you are taking diabetes medicines or blood thinners.  Taking medicines such as aspirin and ibuprofen. These medicines can thin your blood. Do not take these medicines unless your health care provider tells you to take them.  Taking over-the-counter medicines, vitamins, herbs, and supplements. General instructions  Ask your health care provider: ? How your surgery site will be marked. ? What steps will be taken to help prevent infection. These may include:  Washing skin with a germ-killing soap.  Taking antibiotic medicine.  Plan to have someone take you home from the hospital or clinic.  If you will be going home right after the procedure, plan to have someone with you for 24 hours. What happens during the procedure?  An IV will be inserted into one of your veins.  You will be given one or more of the following: ? A medicine to help you relax (sedative). ? A medicine to numb the area (local anesthetic). ? A medicine to make you fall asleep (general anesthetic). ? A medicine that is injected into your spine to numb the area below and slightly above the injection site (spinal anesthetic). ? A medicine that is injected into an area of your body to numb everything below the injection site (regional anesthetic).  Your surgeon will mark the area of your toe for removal.  Your  surgeon will make an incision in your toe.  The dead tissue and bone will be removed.  Nerves and blood vessels will be tied or heated with a tool to stop bleeding.  The area will be drained and cleaned.  If only part of a toe is removed, the remaining part will be covered with a flap of skin.  The incision will be treated in one of these ways: ? It will be closed with stitches (sutures). ? It will be  left open to heal if there is an infection.  The incision area may be packed with gauze and covered with bandages (dressings).  Tissue samples may be sent to a lab to be examined under a microscope. The procedure may vary among health care providers and hospitals. What happens after the procedure?  Your blood pressure, heart rate, breathing rate, and blood oxygen level will be monitored until you leave the hospital or clinic.  Your foot will be raised up (elevated) to relieve swelling.  You will be monitored for pain.  You will be given pain medicines and antibiotics.  Your health care provider or physical therapist will help you move around as soon as possible.  Do not drive for 24 hours if you were given a sedative during your procedure. Summary  Toe amputation is a surgical procedure to remove all or part of a toe.  Before the procedure, follow instructions from your health care provider about taking medicines and about eating or drinking restrictions.  During the procedure, steps will be taken to reduce your risk of infection.  After the procedure, you will be given pain medicines and antibiotics.  After the procedure, your foot will be raised up (elevated) to relieve swelling. This information is not intended to replace advice given to you by your health care provider. Make sure you discuss any questions you have with your health care provider. Document Revised: 04/08/2018 Document Reviewed: 04/08/2018 Elsevier Patient Education  2020 [employer].

## 2020-02-27 NOTE — TOC Transition Note (Signed)
Transition of Care [facility]) - CM/SW Discharge Note   Patient Details  Name: Christy Zhang MRN: [id number] Date of Birth: [birth year]  [facility] of Care [facility]) CM/SW Contact:  Claudie Leach, RN 02/27/2020, 9:48 AM   Clinical Narrative:    Patient to go home with HHPT.  Patient has used [facility] in the past and would like to use again.  Referral accepted by [facility] with [place].   Patient requested WC.  [employer] is out of stock at present.   WC ordered and they will ship to her when available.  Hopefully later this week.    Final next level of care: [place] Barriers to Discharge: No Barriers Identified   Patient Goals and CMS Choice Patient states their goals for this hospitalization and ongoing recovery are:: to get well CMS Medicare.gov Compare Post Acute Care list provided to:: Patient Choice offered to / list presented to : Patient   Discharge Plan and Services                DME Arranged: Lightweight manual wheelchair with seat cushion DME Agency: [employer] Date DME Agency Contacted: 02/27/20 Time DME Agency Contacted: [phone] Representative spoke with at DME Agency: [place]: PT [place]: [place] ([place]) Date [place]: 02/27/20 Time [place]: [phone] Representative spoke with at [place]: Corene Cornea

## 2020-02-27 NOTE — Progress Notes (Addendum)
   VASCULAR SURGERY ASSESSMENT & PLAN:   Day 3 Post-Op: left transmetatarsal amputation:minimal pain currently.  Has [place] shoe. OK to discharge from surgical perspective. Will arrange follow-up with Dr. Scot Dock in 2 weeks.   Disposition: PT recommends HHPT.  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 Carlis Abbott. She had angioplasty of the anterior tibial artery which is her dominant runoff.  Anemia: multifactorial: CKD/acute blood loss: Hgb drop to 6.2 g/dL>>transfused 2 units on Friday: Hgb today is 7.7 g/dL  CKD/ESRDon chronic intermittent HD via RUE AVG.   SUBJECTIVE:   Feels well. Intermittent post-op pain  PHYSICAL EXAM:   Vitals:   02/26/20 1543 02/26/20 1826 02/26/20 2150 02/27/20 0500  BP:  (!) 135/55 (!) 148/46 (!) 133/55  Pulse: 77 79 79 78  Resp: 18 18 16 16   Temp:  98.2 F (36.8 C) 98.6 F (37 C) 98.9 F (37.2 C)  TempSrc:  Oral Oral Oral  SpO2: 97% 96% 95% 92%  Weight:      Height:       Awake, alert in NAD RRR nonlabored Right groin soft without hematoma Left foot surgical dressing dry and intact (RN changed at 0700) Right third toe with dry gangrene. No drainage, erythema or maceration. Chronic ischemic change to distal medial aspect of right second toe. RUE AVG with good bruit  LABS:   Lab Results  Component Value Date   WBC 21.9 (H) 02/27/2020   HGB 7.7 (L) 02/27/2020   HCT 24.6 (L) 02/27/2020   MCV 86.0 02/27/2020   PLT 528 (H) 02/27/2020   Lab Results  Component Value Date   CREATININE 5.37 (H) 02/26/2020   No results found for: INR, PROTIME CBG (last 3)  Recent Labs    02/26/20 1749 02/26/20 2142 02/27/20 0554  GLUCAP 124* 122* 94    PROBLEM LIST:    Active Problems:   Diabetes ([place])   Anemia in chronic kidney disease   Essential (primary) hypertension   Osteomyelitis of third toe of right foot (HCC)    Osteomyelitis (HCC)   Hyperglycemia due to diabetes mellitus (HCC)   Gangrene ([place])   CURRENT MEDS:   . sodium chloride   Intravenous Once  . acetaminophen  650 mg Oral Once  . amLODipine  10 mg Oral QHS  . aspirin EC  81 mg Oral Daily  . atorvastatin  40 mg Oral QHS  . Chlorhexidine Gluconate Cloth  6 each Topical Q0600  . clopidogrel  75 mg Oral QHS  . diphenhydrAMINE  25 mg Intravenous Once  . heparin  5,000 Units Subcutaneous Q12H  . insulin aspart  0-15 Units Subcutaneous TID WC  . insulin aspart  0-5 Units Subcutaneous QHS  . insulin glargine  55 Units Subcutaneous Daily  . metoprolol succinate  100 mg Oral QHS  . multivitamin  1 tablet Oral QHS  . sevelamer carbonate  2,400 mg Oral TID WC   And  . sevelamer carbonate  1,600 mg Oral With snacks  . sodium chloride flush  3 mL Intravenous Q12H  . sodium chloride flush  3 mL Intravenous Q12H    Risa Grill, PA-C Office: [phone] 02/27/2020   I have interviewed the patient and examined the patient. I agree with the findings by the PA.  Gae Gallop, MD [phone]

## 2020-02-27 NOTE — Care Plan (Signed)
The patient will follow-up in the [facility] office clinic in 2 weeks on Dr. Nicole Cella clinic day.  She is s/p left TMA on 02/24/2020 and is closed with staples and Nylon sutures. She has dry gangrene of right 2nd and 3rd toes. If intervention is needed on right foot, Dr. Scot Dock will review case with Dr. Carlis Abbott who performed patient's arteriogram on 02/23/2020.

## 2020-02-28 ENCOUNTER — Telehealth: Payer: Self-pay

## 2020-02-28 DIAGNOSIS — Z992 Dependence on renal dialysis: Secondary | ICD-10-CM | POA: Diagnosis not present

## 2020-02-28 DIAGNOSIS — M1990 Unspecified osteoarthritis, unspecified site: Secondary | ICD-10-CM | POA: Diagnosis not present

## 2020-02-28 DIAGNOSIS — I132 Hypertensive heart and chronic kidney disease with heart failure and with stage 5 chronic kidney disease, or end stage renal disease: Secondary | ICD-10-CM | POA: Diagnosis not present

## 2020-02-28 DIAGNOSIS — E1151 Type 2 diabetes mellitus with diabetic peripheral angiopathy without gangrene: Secondary | ICD-10-CM | POA: Diagnosis not present

## 2020-02-28 DIAGNOSIS — Z89432 Acquired absence of left foot: Secondary | ICD-10-CM | POA: Diagnosis not present

## 2020-02-28 DIAGNOSIS — N186 End stage renal disease: Secondary | ICD-10-CM | POA: Diagnosis not present

## 2020-02-28 DIAGNOSIS — N2581 Secondary hyperparathyroidism of renal origin: Secondary | ICD-10-CM | POA: Diagnosis not present

## 2020-02-28 DIAGNOSIS — Z7902 Long term (current) use of antithrombotics/antiplatelets: Secondary | ICD-10-CM | POA: Diagnosis not present

## 2020-02-28 DIAGNOSIS — I70203 Unspecified atherosclerosis of native arteries of extremities, bilateral legs: Secondary | ICD-10-CM | POA: Diagnosis not present

## 2020-02-28 DIAGNOSIS — D631 Anemia in chronic kidney disease: Secondary | ICD-10-CM | POA: Diagnosis not present

## 2020-02-28 DIAGNOSIS — T8189XD Other complications of procedures, not elsewhere classified, subsequent encounter: Secondary | ICD-10-CM | POA: Diagnosis not present

## 2020-02-28 DIAGNOSIS — E1169 Type 2 diabetes mellitus with other specified complication: Secondary | ICD-10-CM | POA: Diagnosis not present

## 2020-02-28 DIAGNOSIS — D509 Iron deficiency anemia, unspecified: Secondary | ICD-10-CM | POA: Diagnosis not present

## 2020-02-28 DIAGNOSIS — M869 Osteomyelitis, unspecified: Secondary | ICD-10-CM | POA: Diagnosis not present

## 2020-02-28 DIAGNOSIS — Z79891 Long term (current) use of opiate analgesic: Secondary | ICD-10-CM | POA: Diagnosis not present

## 2020-02-28 DIAGNOSIS — G709 Myoneural disorder, unspecified: Secondary | ICD-10-CM | POA: Diagnosis not present

## 2020-02-28 DIAGNOSIS — E785 Hyperlipidemia, unspecified: Secondary | ICD-10-CM | POA: Diagnosis not present

## 2020-02-28 DIAGNOSIS — Z794 Long term (current) use of insulin: Secondary | ICD-10-CM | POA: Diagnosis not present

## 2020-02-28 DIAGNOSIS — I509 Heart failure, unspecified: Secondary | ICD-10-CM | POA: Diagnosis not present

## 2020-02-28 DIAGNOSIS — Z79899 Other long term (current) drug therapy: Secondary | ICD-10-CM | POA: Diagnosis not present

## 2020-02-28 DIAGNOSIS — Z7982 Long term (current) use of aspirin: Secondary | ICD-10-CM | POA: Diagnosis not present

## 2020-02-28 DIAGNOSIS — E1122 Type 2 diabetes mellitus with diabetic chronic kidney disease: Secondary | ICD-10-CM | POA: Diagnosis not present

## 2020-02-28 DIAGNOSIS — I96 Gangrene, not elsewhere classified: Secondary | ICD-10-CM | POA: Diagnosis not present

## 2020-02-28 DIAGNOSIS — Z4801 Encounter for change or removal of surgical wound dressing: Secondary | ICD-10-CM | POA: Diagnosis not present

## 2020-02-28 DIAGNOSIS — J45909 Unspecified asthma, uncomplicated: Secondary | ICD-10-CM | POA: Diagnosis not present

## 2020-02-28 DIAGNOSIS — Z4781 Encounter for orthopedic aftercare following surgical amputation: Secondary | ICD-10-CM | POA: Diagnosis not present

## 2020-02-28 DIAGNOSIS — E1165 Type 2 diabetes mellitus with hyperglycemia: Secondary | ICD-10-CM | POA: Diagnosis not present

## 2020-02-28 DIAGNOSIS — Z87891 Personal history of nicotine dependence: Secondary | ICD-10-CM | POA: Diagnosis not present

## 2020-02-28 NOTE — Telephone Encounter (Signed)
Transition Care Management Unsuccessful Follow-up Telephone Call  Date of discharge and from where:  02/27/20-   Attempts:  1st Attempt  Reason for unsuccessful TCM follow-up call:  Left voice message

## 2020-02-29 ENCOUNTER — Telehealth: Payer: Self-pay

## 2020-02-29 ENCOUNTER — Ambulatory Visit: Payer: Medicare Other | Admitting: Licensed Clinical Social Worker

## 2020-02-29 DIAGNOSIS — J4530 Mild persistent asthma, uncomplicated: Secondary | ICD-10-CM

## 2020-02-29 DIAGNOSIS — E66813 Obesity, class 3: Secondary | ICD-10-CM

## 2020-02-29 DIAGNOSIS — N186 End stage renal disease: Secondary | ICD-10-CM

## 2020-02-29 DIAGNOSIS — N2889 Other specified disorders of kidney and ureter: Secondary | ICD-10-CM

## 2020-02-29 DIAGNOSIS — I151 Hypertension secondary to other renal disorders: Secondary | ICD-10-CM

## 2020-02-29 NOTE — Chronic Care Management (AMB) (Signed)
Chronic Care Management    Clinical Social Work Follow Up Note  02/29/2020 Name: Christy Zhang MRN: [id number] DOB: Jul 23, 1955  Christy Zhang is a 64 y.o. year old female who is a primary care patient of [name], [name], MD. The CCM team was consulted for assistance with [name] .   Review of patient status, including review of consultants reports, other relevant assessments, and collaboration with appropriate care team members and the patient's provider was performed as part of comprehensive patient evaluation and provision of chronic care management services.    SDOH (Social Determinants of Health) assessments performed: No; risk for depression; risk for tobacco use; risk for stress; risk for physical inactivity    Chronic Care Management from 10/12/2019 in [place]  PHQ-9 Total Score 5      Outpatient Encounter Medications as of 02/29/2020  Medication Sig  . acetaminophen (TYLENOL) 325 MG tablet Take 2 tablets (650 mg total) by mouth every 6 (six) hours as needed for mild pain (or Fever >/= 101).  Marland Kitchen amLODipine (NORVASC) 10 MG tablet Take 1 tablet (10 mg total) by mouth at bedtime.  Marland Kitchen aspirin EC 81 MG EC tablet Take 1 tablet (81 mg total) by mouth daily. Swallow whole.  Marland Kitchen atorvastatin (LIPITOR) 40 MG tablet Take 1 tablet (40 mg total) by mouth daily. (Patient taking differently: Take 40 mg by mouth at bedtime. )  . clopidogrel (PLAVIX) 75 MG tablet Take 1 tablet (75 mg total) by mouth daily.  . collagenase (SANTYL) ointment Apply 1 application topically daily. (Patient not taking: Reported on 02/22/2020)  . diphenhydrAMINE (BENADRYL) 25 MG tablet Take 25 mg by mouth every 6 (six) hours as needed for itching.  . Dulaglutide (TRULICITY) 1.5 PQ/3.3AQ SOPN Inject 0.5 mLs (1.5 mg total) into the skin once a week. Saturday  . insulin glargine, 1 Unit Dial, (TOUJEO SOLOSTAR) 300 UNIT/ML Solostar Pen Inject 100 Units into the skin in the morning.  .  metoprolol succinate (TOPROL-XL) 100 MG 24 hr tablet TAKE ONE TABLET BY MOUTH DAILY. TAKE WITH OR IMMEDIATELY FOLLOWING A MEAL. (Patient taking differently: Take 100 mg by mouth daily. TAKE ONE TABLET BY MOUTH DAILY. TAKE WITH OR IMMEDIATELY FOLLOWING A MEAL.)  . multivitamin (RENA-VIT) TABS tablet Take 1 tablet by mouth at bedtime.   Marland Kitchen oxyCODONE-acetaminophen (PERCOCET) 5-325 MG tablet Take 1 tablet by mouth every 8 (eight) hours as needed for up to 10 doses for severe pain.  . sevelamer carbonate (RENVELA) 800 MG tablet Take 1,600-2,400 mg by mouth See admin instructions. Take 2400 mg with each meal and 1600 mg with each snack   Facility-Administered Encounter Medications as of 02/29/2020  Medication  . sodium chloride flush (NS) 0.9 % injection 3 mL  . sodium chloride flush (NS) 0.9 % injection 3 mL    Goals    .  Client will talk with LCSW in next 30 days about managing health issues of client and completion daily activities (pt-stated)      CARE PLAN ENTRY   Current Barriers:  . Diabetes management in client with chronic diagnoses of Asthma, HTN, ESRD, Anemia ,Obesity . Mobility issues (uses a walker to walk)  Clinical Social Work Clinical Goal(s):  Marland Kitchen LCSW will call client in next 30 days to discuss client management of health issues and client completion of daily activities  Interventions: . Talked with client about CCM program services . Talked with client about RNCM support with CCM program . Talked with client about  dialysis treatment for client (goes to dialysis 3 days weekly, goes to dialysis Monday, Wednesday and Friday) . Talked with client about skin care issues of client . Talked with client about LCSW support with CCM program . Talked with client about client completion of ADLs . Talked with client about ambulation needs of client . Talked with client about social support network (daughter is her home health aide) . Talked with client about pain issues of  client . Talked with client about transport issues of client . Talked with client about her use of wheelchair to help her ambulate . Talked with client about her upcoming medical appointments . Talked with client about DME in client home (she has a wheelchair and a 3 in 1 bedside commode) . Collaborated with RNCM regarding nursing needs of client  Patient Self Care Activities:   Attends scheduled medical appointments Eats meals independently Attends Dialysis treatments weekly as scheduled  Patient Self Care Deficits: . Diabetes management     Mobility issues  Initial goal documentation      Follow Up Plan: LCSW to call [name] next 4 weeks to talk about clientmanagement of health issues and to talk withclient or [name] about clientcompletion of daily activities  Norva Riffle.Amelianna Meller MSW, LCSW Licensed Clinical Social Worker [facility] [phone]

## 2020-02-29 NOTE — Telephone Encounter (Signed)
Contact Date: 02/29/2020 Contacted By: Pricilla Riffle  Transition Care Management Follow-up Telephone Call  Date of discharge and from where: 02/27/2020  How have you been since you were released from the hospital? no  Any questions or concerns? No   Items Reviewed:  Did the pt receive and understand the discharge instructions provided? Yes   Medications obtained and verified? Yes   Any new allergies since your discharge? No   Dietary orders reviewed? Yes  Do you have support at home? Yes   Discontinued Medications Amlodipine 5mg  New Medications Added 81mg  Aspirin Amlodipine 10mg  Oxycodone 5-325mg    Current Medication List Allergies as of 02/29/2020      Reactions   Sulfa Antibiotics Swelling      Medication List       Accurate as of February 29, 2020 11:48 AM. If you have any questions, ask your nurse or doctor.        acetaminophen 325 MG tablet Commonly known as: TYLENOL Take 2 tablets (650 mg total) by mouth every 6 (six) hours as needed for mild pain (or Fever >/= 101).   amLODipine 10 MG tablet Commonly known as: NORVASC Take 1 tablet (10 mg total) by mouth at bedtime.   aspirin 81 MG EC tablet Take 1 tablet (81 mg total) by mouth daily. Swallow whole.   atorvastatin 40 MG tablet Commonly known as: LIPITOR Take 1 tablet (40 mg total) by mouth daily. What changed: when to take this   clopidogrel 75 MG tablet Commonly known as: Plavix Take 1 tablet (75 mg total) by mouth daily.   diphenhydrAMINE 25 MG tablet Commonly known as: BENADRYL Take 25 mg by mouth every 6 (six) hours as needed for itching.   metoprolol succinate 100 MG 24 hr tablet Commonly known as: TOPROL-XL TAKE ONE TABLET BY MOUTH DAILY. TAKE WITH OR IMMEDIATELY FOLLOWING A MEAL. What changed:   how much to take  how to take this  when to take this   multivitamin Tabs tablet Take 1 tablet by mouth at bedtime.   oxyCODONE-acetaminophen 5-325 MG tablet Commonly known as:  Percocet Take 1 tablet by mouth every 8 (eight) hours as needed for up to 10 doses for severe pain.   Santyl ointment Generic drug: collagenase Apply 1 application topically daily.   sevelamer carbonate 800 MG tablet Commonly known as: RENVELA Take 1,600-2,400 mg by mouth See admin instructions. Take 2400 mg with each meal and 1600 mg with each snack   Toujeo SoloStar 300 UNIT/ML Solostar Pen Generic drug: insulin glargine (1 Unit Dial) Inject 100 Units into the skin in the morning.   Trulicity 1.5 IO/9.7DZ Sopn Generic drug: Dulaglutide Inject 0.5 mLs (1.5 mg total) into the skin once a week. Saturday        Home Care and Equipment/Supplies: Were home health services ordered? yes If so, what is the name of the agency? [facility]  Has the agency set up a time to come to the patient's home? yes Were any new equipment or medical supplies ordered?  Yes: diaetic shoe What is the name of the medical supply agency? unknown Were you able to get the supplies/equipment? yes Do you have any questions related to the use of the equipment or supplies? No  Functional Questionnaire: (I = Independent and D = Dependent) ADLs: dependent  Bathing/Dressing- dependent   Meal Prep- dependent  Eating- independent  Maintaining continence- independent  Transferring/Ambulation- dependent   Managing Meds- independent  Follow up appointments reviewed:  PCP Hospital f/u appt confirmed? Yes  Scheduled to see Tiiffany on 03/09/2020   Specialist Hospital f/u appt confirmed? Yes  Scheduled to see [facility] on 03/15/2020.  Are transportation arrangements needed? No   If their condition worsens, is the pt aware to call PCP or go to the Emergency Dept.? Yes  Was the patient provided with contact information for the PCP's office or ED? Yes  Was to pt encouraged to call back with questions or concerns? Yes

## 2020-02-29 NOTE — Patient Instructions (Addendum)
Licensed Clinical Social Worker Visit Information  Goals we discussed today:   Client will talk with LCSW in next 30 days about managing health issues of client and completion daily activities (pt-stated)          CARE PLAN ENTRY   Current Barriers:   Diabetes management in client with chronic diagnoses of Asthma, HTN, ESRD, Anemia ,Obesity  Mobility issues (uses a walker to walk)  Clinical Social Work Clinical Goal(s):   LCSW will call client in next 30 days to discuss client management of health issues and client completion of daily activities  Interventions:  Talked with client about CCM program services  Talked with client about RNCM support with CCM program  Talked with client about dialysis treatment for client (goes to dialysis 3 days weekly, goes to dialysis Monday, Wednesday and Friday)  Talked with client about skin care issues of client  Talked with client about LCSW support with CCM program  Talked with client about client completion of ADLs  Talked with client about ambulation needs of client  Talked with client about social support network (daughter is her home health aide)  Talked with client about pain issues of client  Talked with client about transport issues of client  Talked with client about her use of wheelchair to help her ambulate  Talked with client about her upcoming medical appointments  Talked with client about DME in client home (she has a wheelchair and a 3 in 1 bedside commode)  Collaborated with RNCM regarding nursing needs of client  Patient Self Care Activities:   Attends scheduled medical appointments Eats meals independently Attends Dialysis treatments weekly as scheduled  Patient Self Care Deficits:  Diabetes management     Mobility issues  Initial goal documentation      Follow Up Plan: LCSW to call [name] next 4 weeks to talk about clientmanagement of health issues and to talk  withclient or [name] about clientcompletion of daily activities  Materials Provided: No  The patient verbalized understanding of instructions provided today and declined a print copy of patient instruction materials.   Norva Riffle.Javarie Crisp MSW, LCSW Licensed Clinical Social Worker [facility] [phone]

## 2020-03-01 DIAGNOSIS — D509 Iron deficiency anemia, unspecified: Secondary | ICD-10-CM | POA: Diagnosis not present

## 2020-03-01 DIAGNOSIS — N186 End stage renal disease: Secondary | ICD-10-CM | POA: Diagnosis not present

## 2020-03-01 DIAGNOSIS — Z992 Dependence on renal dialysis: Secondary | ICD-10-CM | POA: Diagnosis not present

## 2020-03-01 DIAGNOSIS — N2581 Secondary hyperparathyroidism of renal origin: Secondary | ICD-10-CM | POA: Diagnosis not present

## 2020-03-01 DIAGNOSIS — D631 Anemia in chronic kidney disease: Secondary | ICD-10-CM | POA: Diagnosis not present

## 2020-03-02 DIAGNOSIS — Z4801 Encounter for change or removal of surgical wound dressing: Secondary | ICD-10-CM | POA: Diagnosis not present

## 2020-03-02 DIAGNOSIS — E1165 Type 2 diabetes mellitus with hyperglycemia: Secondary | ICD-10-CM | POA: Diagnosis not present

## 2020-03-02 DIAGNOSIS — I70203 Unspecified atherosclerosis of native arteries of extremities, bilateral legs: Secondary | ICD-10-CM | POA: Diagnosis not present

## 2020-03-02 DIAGNOSIS — Z4781 Encounter for orthopedic aftercare following surgical amputation: Secondary | ICD-10-CM | POA: Diagnosis not present

## 2020-03-02 DIAGNOSIS — E1151 Type 2 diabetes mellitus with diabetic peripheral angiopathy without gangrene: Secondary | ICD-10-CM | POA: Diagnosis not present

## 2020-03-02 DIAGNOSIS — Z89432 Acquired absence of left foot: Secondary | ICD-10-CM | POA: Diagnosis not present

## 2020-03-02 NOTE — Telephone Encounter (Signed)
Called and Lm. Not able to to do [facility]

## 2020-03-03 DIAGNOSIS — D631 Anemia in chronic kidney disease: Secondary | ICD-10-CM | POA: Diagnosis not present

## 2020-03-03 DIAGNOSIS — D509 Iron deficiency anemia, unspecified: Secondary | ICD-10-CM | POA: Diagnosis not present

## 2020-03-03 DIAGNOSIS — N2581 Secondary hyperparathyroidism of renal origin: Secondary | ICD-10-CM | POA: Diagnosis not present

## 2020-03-03 DIAGNOSIS — Z992 Dependence on renal dialysis: Secondary | ICD-10-CM | POA: Diagnosis not present

## 2020-03-03 DIAGNOSIS — N186 End stage renal disease: Secondary | ICD-10-CM | POA: Diagnosis not present

## 2020-03-04 DIAGNOSIS — Z89432 Acquired absence of left foot: Secondary | ICD-10-CM | POA: Diagnosis not present

## 2020-03-04 DIAGNOSIS — E1165 Type 2 diabetes mellitus with hyperglycemia: Secondary | ICD-10-CM | POA: Diagnosis not present

## 2020-03-04 DIAGNOSIS — Z4781 Encounter for orthopedic aftercare following surgical amputation: Secondary | ICD-10-CM | POA: Diagnosis not present

## 2020-03-04 DIAGNOSIS — E1151 Type 2 diabetes mellitus with diabetic peripheral angiopathy without gangrene: Secondary | ICD-10-CM | POA: Diagnosis not present

## 2020-03-04 DIAGNOSIS — Z4801 Encounter for change or removal of surgical wound dressing: Secondary | ICD-10-CM | POA: Diagnosis not present

## 2020-03-04 DIAGNOSIS — I70203 Unspecified atherosclerosis of native arteries of extremities, bilateral legs: Secondary | ICD-10-CM | POA: Diagnosis not present

## 2020-03-06 DIAGNOSIS — Z992 Dependence on renal dialysis: Secondary | ICD-10-CM | POA: Diagnosis not present

## 2020-03-06 DIAGNOSIS — N186 End stage renal disease: Secondary | ICD-10-CM | POA: Diagnosis not present

## 2020-03-06 DIAGNOSIS — D509 Iron deficiency anemia, unspecified: Secondary | ICD-10-CM | POA: Diagnosis not present

## 2020-03-06 DIAGNOSIS — D631 Anemia in chronic kidney disease: Secondary | ICD-10-CM | POA: Diagnosis not present

## 2020-03-06 DIAGNOSIS — N2581 Secondary hyperparathyroidism of renal origin: Secondary | ICD-10-CM | POA: Diagnosis not present

## 2020-03-07 DIAGNOSIS — E1165 Type 2 diabetes mellitus with hyperglycemia: Secondary | ICD-10-CM | POA: Diagnosis not present

## 2020-03-07 DIAGNOSIS — Z89432 Acquired absence of left foot: Secondary | ICD-10-CM | POA: Diagnosis not present

## 2020-03-07 DIAGNOSIS — Z4801 Encounter for change or removal of surgical wound dressing: Secondary | ICD-10-CM | POA: Diagnosis not present

## 2020-03-07 DIAGNOSIS — E1151 Type 2 diabetes mellitus with diabetic peripheral angiopathy without gangrene: Secondary | ICD-10-CM | POA: Diagnosis not present

## 2020-03-07 DIAGNOSIS — Z4781 Encounter for orthopedic aftercare following surgical amputation: Secondary | ICD-10-CM | POA: Diagnosis not present

## 2020-03-07 DIAGNOSIS — I70203 Unspecified atherosclerosis of native arteries of extremities, bilateral legs: Secondary | ICD-10-CM | POA: Diagnosis not present

## 2020-03-08 DIAGNOSIS — N186 End stage renal disease: Secondary | ICD-10-CM | POA: Diagnosis not present

## 2020-03-08 DIAGNOSIS — D631 Anemia in chronic kidney disease: Secondary | ICD-10-CM | POA: Diagnosis not present

## 2020-03-08 DIAGNOSIS — Z992 Dependence on renal dialysis: Secondary | ICD-10-CM | POA: Diagnosis not present

## 2020-03-08 DIAGNOSIS — N2581 Secondary hyperparathyroidism of renal origin: Secondary | ICD-10-CM | POA: Diagnosis not present

## 2020-03-08 DIAGNOSIS — D509 Iron deficiency anemia, unspecified: Secondary | ICD-10-CM | POA: Diagnosis not present

## 2020-03-09 ENCOUNTER — Other Ambulatory Visit: Payer: Self-pay

## 2020-03-09 ENCOUNTER — Ambulatory Visit (INDEPENDENT_AMBULATORY_CARE_PROVIDER_SITE_OTHER): Payer: Medicare Other | Admitting: Family Medicine

## 2020-03-09 ENCOUNTER — Encounter: Payer: Self-pay | Admitting: Family Medicine

## 2020-03-09 VITALS — BP 152/75 | HR 75 | Temp 97.4°F

## 2020-03-09 DIAGNOSIS — Z89432 Acquired absence of left foot: Secondary | ICD-10-CM | POA: Diagnosis not present

## 2020-03-09 DIAGNOSIS — I70203 Unspecified atherosclerosis of native arteries of extremities, bilateral legs: Secondary | ICD-10-CM | POA: Diagnosis not present

## 2020-03-09 DIAGNOSIS — N2889 Other specified disorders of kidney and ureter: Secondary | ICD-10-CM

## 2020-03-09 DIAGNOSIS — I151 Hypertension secondary to other renal disorders: Secondary | ICD-10-CM | POA: Diagnosis not present

## 2020-03-09 DIAGNOSIS — Z4801 Encounter for change or removal of surgical wound dressing: Secondary | ICD-10-CM | POA: Diagnosis not present

## 2020-03-09 DIAGNOSIS — S98132A Complete traumatic amputation of one left lesser toe, initial encounter: Secondary | ICD-10-CM | POA: Diagnosis not present

## 2020-03-09 DIAGNOSIS — I96 Gangrene, not elsewhere classified: Secondary | ICD-10-CM | POA: Diagnosis not present

## 2020-03-09 DIAGNOSIS — Z4781 Encounter for orthopedic aftercare following surgical amputation: Secondary | ICD-10-CM | POA: Diagnosis not present

## 2020-03-09 DIAGNOSIS — N186 End stage renal disease: Secondary | ICD-10-CM

## 2020-03-09 DIAGNOSIS — E1165 Type 2 diabetes mellitus with hyperglycemia: Secondary | ICD-10-CM | POA: Diagnosis not present

## 2020-03-09 DIAGNOSIS — E1151 Type 2 diabetes mellitus with diabetic peripheral angiopathy without gangrene: Secondary | ICD-10-CM | POA: Diagnosis not present

## 2020-03-09 DIAGNOSIS — Z09 Encounter for follow-up examination after completed treatment for conditions other than malignant neoplasm: Secondary | ICD-10-CM | POA: Diagnosis not present

## 2020-03-09 NOTE — Patient Instructions (Signed)

## 2020-03-09 NOTE — Progress Notes (Addendum)
Subjective: CC: hospital follow up PCP: Christy Fraise, MD  Christy Zhang is a 64 y.o. female presenting to clinic today for:  1. Hospital follow up [facility] had her toes removed on her left foot on 02/24/20. 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 She was being followed by a wound care center, when they noticed worsening condition of the 3rd toe of her left foot and sent her to the ED. Vascular surgery was consulted in the hospital and she underweight removal of her left toes for gangrene. She has had home health coming on Tuesday and Thursday for dressing changes. Her daughter has been changing on all other days. She denies fever or purulent drainage. She has an appointment with vascular surgery on 11/17 for follow up and further assessment of the toes on her right foot. She is getting PT twice a week at home and is currently using a wheelchair.  She also has an appointment with wound care on 03/14/20.  2. HTN She has her BP checked 3x a week at dialysis and when the nurse comes to change her bandages. She has dialysis tomorrow. Yesterday after her dialysis visit her systolic was 951. She is unable to recall other numbers right now. In the hopital they increased her amlopdine to 10 mg. In the hospital her BP was 130-140s/50s. She denies chest pain, shortness of breath, or dizziness. She also takes Toprol-XL 100 mg daily.    Relevant past medical, surgical, family, and social history reviewed and updated as indicated.  Allergies and medications reviewed and updated.  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   End stage renal disease (HCC)    End stage renal disease  on dialysis [facility])    Hemodialysis access, arteriovenous graft (HCC)    Hyperlipidemia    Hypertension    Neuromuscular disorder (HCC)     Current Outpatient Medications:    acetaminophen (TYLENOL) 325 MG tablet, Take 2 tablets (650 mg total) by mouth every 6 (six) hours as needed for mild pain (or Fever >/= 101)., Disp: 12 tablet, Rfl: 0   amLODipine (NORVASC) 10 MG tablet, Take 1 tablet (10 mg total) by mouth at bedtime., Disp: 30 tablet, Rfl: 0   aspirin EC 81 MG EC tablet, Take 1 tablet (81 mg total) by mouth daily. Swallow whole., Disp: 30 tablet, Rfl: 0   atorvastatin (LIPITOR) 40 MG tablet, Take 1 tablet (40 mg total) by mouth daily. (Patient taking differently: Take 40 mg by mouth at bedtime. ), Disp: 90 tablet, Rfl: 3   clopidogrel (PLAVIX) 75 MG tablet, Take 1 tablet (75 mg total) by mouth daily., Disp: 30 tablet, Rfl: 11   collagenase (SANTYL) ointment, Apply 1 application topically daily., Disp: 90 g, Rfl: 0   diphenhydrAMINE (BENADRYL) 25 MG tablet, Take 25 mg by mouth every 6 (six) hours as needed for itching., Disp: , Rfl:    Dulaglutide (TRULICITY) 1.5 OA/4.1YS SOPN, Inject 0.5 mLs (1.5 mg total) into the skin once a week. Saturday, Disp: , Rfl:    insulin glargine, 1 Unit Dial, (TOUJEO SOLOSTAR) 300 UNIT/ML Solostar Pen, Inject  100 Units into the skin in the morning., Disp: 30 mL, Rfl: 11   metoprolol succinate (TOPROL-XL) 100 MG 24 hr tablet, TAKE ONE TABLET BY MOUTH DAILY. TAKE WITH OR IMMEDIATELY FOLLOWING A MEAL. (Patient taking differently: Take 100 mg by mouth daily. TAKE ONE TABLET BY MOUTH DAILY. TAKE WITH OR IMMEDIATELY FOLLOWING A MEAL.), Disp: 90 tablet, Rfl: 1   multivitamin (RENA-VIT) TABS tablet, Take 1 tablet by mouth at bedtime. , Disp: , Rfl:    oxyCODONE-acetaminophen (PERCOCET) 5-325 MG tablet, Take 1 tablet by mouth every 8 (eight) hours as needed for up to 10 doses for severe pain., Disp: 10 tablet, Rfl: 0   sevelamer carbonate (RENVELA) 800  MG tablet, Take 1,600-2,400 mg by mouth See admin instructions. Take 2400 mg with each meal and 1600 mg with each snack, Disp: , Rfl:   Current Facility-Administered Medications:    sodium chloride flush (NS) 0.9 % injection 3 mL, 3 mL, Intravenous, Q12H, Clark, Gwenyth Allegra, MD   sodium chloride flush (NS) 0.9 % injection 3 mL, 3 mL, Intravenous, PRN, [name], MD Social History   Socioeconomic History   Marital status: Widowed    Spouse name: Not on file   Number of children: Not on file   Years of education: Not on file   Highest education level: Not on file  Occupational History   Not on file  Tobacco Use   Smoking status: Former Smoker    Types: Cigarettes    Quit date: 07/25/1984    Years since quitting: 35.6   Smokeless tobacco: Never Used  Vaping Use   Vaping Use: Never used  Substance and Sexual Activity   Alcohol use: No    Alcohol/week: 0.0 standard drinks   Drug use: No   Sexual activity: Not on file  Other Topics Concern   Not on file  Social History Narrative   Not on file   Social Determinants of Health   Financial Resource Strain:    Difficulty of Paying Living Expenses: Not on file  Food Insecurity:    Worried About Charity fundraiser in the Last Year: Not on file   [employer] of Food in the Last Year: Not on file  Transportation Needs:    Lack of Transportation (Medical): Not on file   Lack of Transportation (Non-Medical): Not on file  Physical Activity:    Days of Exercise per Week: Not on file   Minutes of Exercise per Session: Not on file  Stress:    Feeling of Stress : Not on file  Social Connections:    Frequency of Communication with Friends and Family: Not on file   Frequency of Social Gatherings with Friends and Family: Not on file   Attends Religious Services: Not on file   Active Member of Clubs or Organizations: Not on file   Attends Archivist Meetings: Not on file   Marital Status:  Not on file  Intimate Partner Violence:    Fear of Current or Ex-Partner: Not on file   Emotionally Abused: Not on file   Physically Abused: Not on file   Sexually Abused: Not on file   Family History  Problem Relation Age of Onset   Diabetes Mother    Hypertension Mother    Cancer Father    Diabetes Father    Hypertension Sister    Diabetes Daughter    Heart disease Daughter    Hypertension Daughter    Diabetes Son  Heart disease Son    Hypertension Son    Peripheral vascular disease Son        amputation    Review of Systems  Per HPI.  Objective: Office vital signs reviewed. BP (!) 152/75    Pulse 75    Temp (!) 97.4 F (36.3 C) (Temporal)    LMP  (LMP Unknown)    SpO2 97%   Physical Examination:  Physical Exam Constitutional:      General: She is not in acute distress.    Appearance: Normal appearance. She is well-developed. She is not ill-appearing, toxic-appearing or diaphoretic.  Neck:     Thyroid: No thyromegaly.     Vascular: No carotid bruit or JVD.     Trachea: Trachea normal.  Cardiovascular:     Rate and Rhythm: Normal rate and regular rhythm.     Heart sounds: Normal heart sounds. No murmur heard.  No friction rub. No gallop.   Pulmonary:     Effort: Pulmonary effort is normal.     Breath sounds: Normal breath sounds.  Musculoskeletal:     Cervical back: Full passive range of motion without pain.     Right lower leg: 1+ Edema present.     Left lower leg: 1+ Edema present.  Feet:     Comments: Left foot surgical wound with staples intact. No erythema, tenderness, or warmth noted. Slight serosanguinous drainage on bandage.  Lymphadenopathy:     Cervical: No cervical adenopathy.  Skin:    General: Skin is warm and dry.  Neurological:     General: No focal deficit present.     Mental Status: She is alert and oriented to person, place, and time.     Gait: Gait abnormal (wheelchair).     Deep Tendon Reflexes: Reflexes are normal  and symmetric.  Psychiatric:        Behavior: Behavior normal.        Thought Content: Thought content normal.        Judgment: Judgment normal.      Results for orders placed or performed during the hospital encounter of 02/22/20  Blood culture (routine x 2)   Specimen: BLOOD RIGHT ARM  Result Value Ref Range   Specimen Description BLOOD RIGHT ARM    Special Requests      BOTTLES DRAWN AEROBIC AND ANAEROBIC Blood Culture results may not be optimal due to an inadequate volume of blood received in culture bottles   Culture      NO GROWTH 5 DAYS Performed at [facility] [address] [place], [place] [address]    Report Status 02/27/2020 FINAL   Respiratory Panel by RT PCR (Flu A&B, Covid) - Nasopharyngeal Swab   Specimen: Nasopharyngeal Swab  Result Value Ref Range   SARS Coronavirus 2 by RT PCR NEGATIVE NEGATIVE   Influenza A by PCR NEGATIVE NEGATIVE   Influenza B by PCR NEGATIVE NEGATIVE  Surgical pcr screen   Specimen: Nasal Mucosa; Nasal Swab  Result Value Ref Range   MRSA, PCR NEGATIVE NEGATIVE   Staphylococcus aureus NEGATIVE NEGATIVE  CBC with Differential  Result Value Ref Range   WBC 17.2 (H) 4.0 - 10.5 K/uL   RBC 3.21 (L) 3.87 - 5.11 MIL/uL   Hemoglobin 8.2 (L) 12.0 - 15.0 g/dL   HCT 27.3 (L) 36 - 46 %   MCV 85.0 80.0 - 100.0 fL   MCH 25.5 (L) 26.0 - 34.0 pg   MCHC 30.0 30.0 - 36.0 g/dL  RDW 15.9 (H) 11.5 - 15.5 %   Platelets 640 (H) 150 - 400 K/uL   nRBC 0.0 0.0 - 0.2 %   Neutrophils Relative % 74 %   Neutro Abs 12.8 (H) 1.7 - 7.7 K/uL   Lymphocytes Relative 15 %   Lymphs Abs 2.5 0.7 - 4.0 K/uL   Monocytes Relative 6 %   Monocytes Absolute 1.1 (H) 0.1 - 1.0 K/uL   Eosinophils Relative 4 %   Eosinophils Absolute 0.6 (H) 0.0 - 0.5 K/uL   Basophils Relative 0 %   Basophils Absolute 0.1 0.0 - 0.1 K/uL   Immature Granulocytes 1 %   Abs Immature Granulocytes 0.13 (H) 0.00 - 0.07 K/uL  Comprehensive metabolic panel  Result Value Ref Range    Sodium 137 135 - 145 mmol/L   Potassium 3.7 3.5 - 5.1 mmol/L   Chloride 99 98 - 111 mmol/L   CO2 26 22 - 32 mmol/L   Glucose, Bld 79 70 - 99 mg/dL   BUN 18 8 - 23 mg/dL   Creatinine, Ser 6.76 (H) 0.44 - 1.00 mg/dL   Calcium 8.4 (L) 8.9 - 10.3 mg/dL   Total Protein 7.2 6.5 - 8.1 g/dL   Albumin 2.4 (L) 3.5 - 5.0 g/dL   AST 9 (L) 15 - 41 U/L   ALT 8 0 - 44 U/L   Alkaline Phosphatase 58 38 - 126 U/L   Total Bilirubin 0.5 0.3 - 1.2 mg/dL   GFR, Estimated 6 (L) >60 mL/min   Anion gap 12 5 - 15  Lactic acid, plasma  Result Value Ref Range   Lactic Acid, Venous 1.3 0.5 - 1.9 mmol/L  Lactic acid, plasma  Result Value Ref Range   Lactic Acid, Venous 1.3 0.5 - 1.9 mmol/L  Basic metabolic panel  Result Value Ref Range   Sodium 137 135 - 145 mmol/L   Potassium 3.8 3.5 - 5.1 mmol/L   Chloride 101 98 - 111 mmol/L   CO2 24 22 - 32 mmol/L   Glucose, Bld 99 70 - 99 mg/dL   BUN 22 8 - 23 mg/dL   Creatinine, Ser 7.56 (H) 0.44 - 1.00 mg/dL   Calcium 8.4 (L) 8.9 - 10.3 mg/dL   GFR, Estimated 6 (L) >60 mL/min   Anion gap 12 5 - 15  CBC  Result Value Ref Range   WBC 17.1 (H) 4.0 - 10.5 K/uL   RBC 2.99 (L) 3.87 - 5.11 MIL/uL   Hemoglobin 7.6 (L) 12.0 - 15.0 g/dL   HCT 25.2 (L) 36 - 46 %   MCV 84.3 80.0 - 100.0 fL   MCH 25.4 (L) 26.0 - 34.0 pg   MCHC 30.2 30.0 - 36.0 g/dL   RDW 16.0 (H) 11.5 - 15.5 %   Platelets 592 (H) 150 - 400 K/uL   nRBC 0.0 0.0 - 0.2 %  Glucose, capillary  Result Value Ref Range   Glucose-Capillary 98 70 - 99 mg/dL  Glucose, capillary  Result Value Ref Range   Glucose-Capillary 67 (L) 70 - 99 mg/dL  Glucose, capillary  Result Value Ref Range   Glucose-Capillary 119 (H) 70 - 99 mg/dL  Hepatitis B surface antigen  Result Value Ref Range   Hepatitis B Surface Ag NON REACTIVE NON REACTIVE  Glucose, capillary  Result Value Ref Range   Glucose-Capillary 72 70 - 99 mg/dL  Glucose, capillary  Result Value Ref Range   Glucose-Capillary 79 70 - 99 mg/dL  Glucose,  capillary  Result  Value Ref Range   Glucose-Capillary 139 (H) 70 - 99 mg/dL  Glucose, capillary  Result Value Ref Range   Glucose-Capillary 125 (H) 70 - 99 mg/dL  Glucose, capillary  Result Value Ref Range   Glucose-Capillary 114 (H) 70 - 99 mg/dL  Glucose, capillary  Result Value Ref Range   Glucose-Capillary 104 (H) 70 - 99 mg/dL  Glucose, capillary  Result Value Ref Range   Glucose-Capillary 122 (H) 70 - 99 mg/dL  Glucose, capillary  Result Value Ref Range   Glucose-Capillary 172 (H) 70 - 99 mg/dL  CBC with Differential/Platelet  Result Value Ref Range   WBC 18.7 (H) 4.0 - 10.5 K/uL   RBC 2.43 (L) 3.87 - 5.11 MIL/uL   Hemoglobin 6.2 (LL) 12.0 - 15.0 g/dL   HCT 20.5 (L) 36 - 46 %   MCV 84.4 80.0 - 100.0 fL   MCH 25.5 (L) 26.0 - 34.0 pg   MCHC 30.2 30.0 - 36.0 g/dL   RDW 15.9 (H) 11.5 - 15.5 %   Platelets 564 (H) 150 - 400 K/uL   nRBC 0.0 0.0 - 0.2 %   Neutrophils Relative % 77 %   Neutro Abs 14.3 (H) 1.7 - 7.7 K/uL   Lymphocytes Relative 12 %   Lymphs Abs 2.2 0.7 - 4.0 K/uL   Monocytes Relative 7 %   Monocytes Absolute 1.3 (H) 0.1 - 1.0 K/uL   Eosinophils Relative 3 %   Eosinophils Absolute 0.6 (H) 0.0 - 0.5 K/uL   Basophils Relative 0 %   Basophils Absolute 0.1 0.0 - 0.1 K/uL   Immature Granulocytes 1 %   Abs Immature Granulocytes 0.20 (H) 0.00 - 0.07 K/uL  Basic metabolic panel  Result Value Ref Range   Sodium 135 135 - 145 mmol/L   Potassium 4.2 3.5 - 5.1 mmol/L   Chloride 96 (L) 98 - 111 mmol/L   CO2 27 22 - 32 mmol/L   Glucose, Bld 136 (H) 70 - 99 mg/dL   BUN 23 8 - 23 mg/dL   Creatinine, Ser 7.46 (H) 0.44 - 1.00 mg/dL   Calcium 8.0 (L) 8.9 - 10.3 mg/dL   GFR, Estimated 6 (L) >60 mL/min   Anion gap 12 5 - 15  Phosphorus  Result Value Ref Range   Phosphorus 5.1 (H) 2.5 - 4.6 mg/dL  Glucose, capillary  Result Value Ref Range   Glucose-Capillary 114 (H) 70 - 99 mg/dL  Glucose, capillary  Result Value Ref Range   Glucose-Capillary 149 (H) 70 - 99 mg/dL   Glucose, capillary  Result Value Ref Range   Glucose-Capillary 128 (H) 70 - 99 mg/dL  CBC with Differential/Platelet  Result Value Ref Range   WBC 21.6 (H) 4.0 - 10.5 K/uL   RBC 3.07 (L) 3.87 - 5.11 MIL/uL   Hemoglobin 8.3 (L) 12.0 - 15.0 g/dL   HCT 26.1 (L) 36 - 46 %   MCV 85.0 80.0 - 100.0 fL   MCH 27.0 26.0 - 34.0 pg   MCHC 31.8 30.0 - 36.0 g/dL   RDW 15.3 11.5 - 15.5 %   Platelets 524 (H) 150 - 400 K/uL   nRBC 0.0 0.0 - 0.2 %   Neutrophils Relative % 79 %   Neutro Abs 17.1 (H) 1.7 - 7.7 K/uL   Lymphocytes Relative 10 %   Lymphs Abs 2.2 0.7 - 4.0 K/uL   Monocytes Relative 8 %   Monocytes Absolute 1.6 (H) 0.1 - 1.0 K/uL   Eosinophils Relative 2 %  Eosinophils Absolute 0.5 0.0 - 0.5 K/uL   Basophils Relative 0 %   Basophils Absolute 0.1 0.0 - 0.1 K/uL   Immature Granulocytes 1 %   Abs Immature Granulocytes 0.20 (H) 0.00 - 0.07 K/uL  Basic metabolic panel  Result Value Ref Range   Sodium 137 135 - 145 mmol/L   Potassium 4.1 3.5 - 5.1 mmol/L   Chloride 99 98 - 111 mmol/L   CO2 26 22 - 32 mmol/L   Glucose, Bld 136 (H) 70 - 99 mg/dL   BUN 13 8 - 23 mg/dL   Creatinine, Ser 5.37 (H) 0.44 - 1.00 mg/dL   Calcium 8.1 (L) 8.9 - 10.3 mg/dL   GFR, Estimated 8 (L) >60 mL/min   Anion gap 12 5 - 15  Hemoglobin and hematocrit, blood  Result Value Ref Range   Hemoglobin 9.1 (L) 12.0 - 15.0 g/dL   HCT 27.5 (L) 36 - 46 %  Glucose, capillary  Result Value Ref Range   Glucose-Capillary 186 (H) 70 - 99 mg/dL  Glucose, capillary  Result Value Ref Range   Glucose-Capillary 157 (H) 70 - 99 mg/dL  Ferritin  Result Value Ref Range   Ferritin 962 (H) 11 - 307 ng/mL  Iron and TIBC  Result Value Ref Range   Iron 14 (L) 28 - 170 ug/dL   TIBC 104 (L) 250 - 450 ug/dL   Saturation Ratios 14 10.4 - 31.8 %   UIBC 90 ug/dL  CBC with Differential/Platelet  Result Value Ref Range   WBC 21.9 (H) 4.0 - 10.5 K/uL   RBC 2.86 (L) 3.87 - 5.11 MIL/uL   Hemoglobin 7.7 (L) 12.0 - 15.0 g/dL   HCT  24.6 (L) 36 - 46 %   MCV 86.0 80.0 - 100.0 fL   MCH 26.9 26.0 - 34.0 pg   MCHC 31.3 30.0 - 36.0 g/dL   RDW 15.4 11.5 - 15.5 %   Platelets 528 (H) 150 - 400 K/uL   nRBC 0.0 0.0 - 0.2 %   Neutrophils Relative % 78 %   Neutro Abs 17.1 (H) 1.7 - 7.7 K/uL   Lymphocytes Relative 11 %   Lymphs Abs 2.3 0.7 - 4.0 K/uL   Monocytes Relative 7 %   Monocytes Absolute 1.6 (H) 0.1 - 1.0 K/uL   Eosinophils Relative 3 %   Eosinophils Absolute 0.6 (H) 0.0 - 0.5 K/uL   Basophils Relative 0 %   Basophils Absolute 0.1 0.0 - 0.1 K/uL   Immature Granulocytes 1 %   Abs Immature Granulocytes 0.22 (H) 0.00 - 0.07 K/uL  Glucose, capillary  Result Value Ref Range   Glucose-Capillary 122 (H) 70 - 99 mg/dL  Glucose, capillary  Result Value Ref Range   Glucose-Capillary 199 (H) 70 - 99 mg/dL  Glucose, capillary  Result Value Ref Range   Glucose-Capillary 124 (H) 70 - 99 mg/dL  Glucose, capillary  Result Value Ref Range   Glucose-Capillary 94 70 - 99 mg/dL  Glucose, capillary  Result Value Ref Range   Glucose-Capillary 235 (H) 70 - 99 mg/dL  CBG monitoring, ED  Result Value Ref Range   Glucose-Capillary 99 70 - 99 mg/dL  Type and screen [place]  Result Value Ref Range   ABO/RH(D) B POS    Antibody Screen NEG    Sample Expiration 02/28/2020,2359    Unit Number [id number]    Blood Component Type RED CELLS,LR    Unit division 00    Status of Unit  ISSUED,FINAL    Transfusion Status OK TO TRANSFUSE    Crossmatch Result Compatible    Unit Number [id number]    Blood Component Type RBC LR PHER1    Unit division 00    Status of Unit ISSUED,FINAL    Transfusion Status OK TO TRANSFUSE    Crossmatch Result      Compatible Performed at [facility], [address], [place], [place] [address]   Prepare RBC (crossmatch)  Result Value Ref Range   Order Confirmation      ORDER PROCESSED BY BLOOD BANK Performed at [facility], [address], [place], [place]  [address]   ABO/Rh  Result Value Ref Range   ABO/RH(D)      B POS Performed at [facility] [address] [place], [place] [address]   BPAM [facility]  Result Value Ref Range   ISSUE DATE / TIME [id number]    Blood Product Unit Number [id number]    PRODUCT CODE [id number]    Unit Type and Rh 7300    Blood Product Expiration Date 338329191660    ISSUE DATE / TIME [id number]    Blood Product Unit Number [id number]    PRODUCT CODE [id number]    Unit Type and Rh 7300    Blood Product Expiration Date [id number]      Assessment/ Plan: Christy Zhang was seen today for hospitalization follow-up.  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 Follow up with vascular surgery on 03/15/20. Continue daily dressing changes. No signs of infection today at surgical site. Continue home PT.  -     BMP8+EGFR -     CBC with Differential/Platelet  Hypertension secondary to other renal disorders BP elevated today, however it seems to be controlled at home. BP log given to patient to log when BP is checked at dialysis and with home health. Notify provider if BP is consistently >130/80s. -     BMP8+EGFR -     CBC with Differential/Platelet  ESRD (end stage renal disease) ([facility]) -     [facility] discharge follow-up Hospital records reviewed. Patient was admitted to the ED after gangrene changes noted on her left 3rd toe that resulted in surgical removal of her left toes. Follow up scheduled with vascular surgery on 03/15/20.   Follow up in 1 month with PCP for chronic follow up. Return to office for new or worsening symptoms, or if symptoms persist.   The above assessment and management plan was discussed with the patient. The patient verbalized understanding of and has agreed to the management plan. Patient is aware to call the clinic if symptoms persist or worsen. Patient is aware when to return  to the clinic for a follow-up visit. Patient educated on when it is appropriate to go to the emergency department.   Christy Smolder, FNP-C [facility] [address] [place], [place] [address] [phone]

## 2020-03-10 DIAGNOSIS — N186 End stage renal disease: Secondary | ICD-10-CM | POA: Diagnosis not present

## 2020-03-10 DIAGNOSIS — D509 Iron deficiency anemia, unspecified: Secondary | ICD-10-CM | POA: Diagnosis not present

## 2020-03-10 DIAGNOSIS — N2581 Secondary hyperparathyroidism of renal origin: Secondary | ICD-10-CM | POA: Diagnosis not present

## 2020-03-10 DIAGNOSIS — D631 Anemia in chronic kidney disease: Secondary | ICD-10-CM | POA: Diagnosis not present

## 2020-03-10 DIAGNOSIS — Z992 Dependence on renal dialysis: Secondary | ICD-10-CM | POA: Diagnosis not present

## 2020-03-10 LAB — BMP8+EGFR
BUN/Creatinine Ratio: 4 — ABNORMAL LOW (ref 12–28)
BUN: 27 mg/dL (ref 8–27)
CO2: 20 mmol/L (ref 20–29)
Calcium: 8.5 mg/dL — ABNORMAL LOW (ref 8.7–10.3)
Chloride: 96 mmol/L (ref 96–106)
Creatinine, Ser: 6.14 mg/dL (ref 0.57–1.00)
GFR calc Af Amer: 8 mL/min/{1.73_m2} — ABNORMAL LOW (ref >59)
GFR calc non Af Amer: 7 mL/min/{1.73_m2} — ABNORMAL LOW (ref >59)
Glucose: 56 mg/dL — ABNORMAL LOW (ref 65–99)
Potassium: 5.2 mmol/L (ref 3.5–5.2)
Sodium: 141 mmol/L (ref 134–144)

## 2020-03-10 LAB — CBC WITH DIFFERENTIAL/PLATELET
Basophils Absolute: 0.1 10*3/uL (ref 0.0–0.2)
Basos: 1 %
EOS (ABSOLUTE): 0.9 10*3/uL — ABNORMAL HIGH (ref 0.0–0.4)
Eos: 5 %
Hematocrit: 32.1 % — ABNORMAL LOW (ref 34.0–46.6)
Hemoglobin: 10.2 g/dL — ABNORMAL LOW (ref 11.1–15.9)
Immature Grans (Abs): 0.1 10*3/uL (ref 0.0–0.1)
Immature Granulocytes: 1 %
Lymphocytes Absolute: 2.4 10*3/uL (ref 0.7–3.1)
Lymphs: 13 %
MCH: 27 pg (ref 26.6–33.0)
MCHC: 31.8 g/dL (ref 31.5–35.7)
MCV: 85 fL (ref 79–97)
Monocytes Absolute: 1.1 10*3/uL — ABNORMAL HIGH (ref 0.1–0.9)
Monocytes: 6 %
Neutrophils Absolute: 13.5 10*3/uL — ABNORMAL HIGH (ref 1.4–7.0)
Neutrophils: 74 %
Platelets: 774 10*3/uL — ABNORMAL HIGH (ref 150–450)
RBC: 3.78 x10E6/uL (ref 3.77–5.28)
RDW: 15.2 % (ref 11.7–15.4)
WBC: 18 10*3/uL — ABNORMAL HIGH (ref 3.4–10.8)

## 2020-03-11 ENCOUNTER — Other Ambulatory Visit: Payer: Self-pay | Admitting: Family

## 2020-03-13 ENCOUNTER — Other Ambulatory Visit: Payer: Self-pay

## 2020-03-13 ENCOUNTER — Ambulatory Visit (INDEPENDENT_AMBULATORY_CARE_PROVIDER_SITE_OTHER): Payer: Medicare Other

## 2020-03-13 DIAGNOSIS — I70203 Unspecified atherosclerosis of native arteries of extremities, bilateral legs: Secondary | ICD-10-CM | POA: Diagnosis not present

## 2020-03-13 DIAGNOSIS — D509 Iron deficiency anemia, unspecified: Secondary | ICD-10-CM | POA: Diagnosis not present

## 2020-03-13 DIAGNOSIS — N186 End stage renal disease: Secondary | ICD-10-CM | POA: Diagnosis not present

## 2020-03-13 DIAGNOSIS — M1990 Unspecified osteoarthritis, unspecified site: Secondary | ICD-10-CM

## 2020-03-13 DIAGNOSIS — E1165 Type 2 diabetes mellitus with hyperglycemia: Secondary | ICD-10-CM | POA: Diagnosis not present

## 2020-03-13 DIAGNOSIS — Z89432 Acquired absence of left foot: Secondary | ICD-10-CM

## 2020-03-13 DIAGNOSIS — D631 Anemia in chronic kidney disease: Secondary | ICD-10-CM | POA: Diagnosis not present

## 2020-03-13 DIAGNOSIS — E1122 Type 2 diabetes mellitus with diabetic chronic kidney disease: Secondary | ICD-10-CM

## 2020-03-13 DIAGNOSIS — I509 Heart failure, unspecified: Secondary | ICD-10-CM

## 2020-03-13 DIAGNOSIS — G709 Myoneural disorder, unspecified: Secondary | ICD-10-CM

## 2020-03-13 DIAGNOSIS — N185 Chronic kidney disease, stage 5: Secondary | ICD-10-CM | POA: Diagnosis not present

## 2020-03-13 DIAGNOSIS — T8189XD Other complications of procedures, not elsewhere classified, subsequent encounter: Secondary | ICD-10-CM

## 2020-03-13 DIAGNOSIS — N2581 Secondary hyperparathyroidism of renal origin: Secondary | ICD-10-CM | POA: Diagnosis not present

## 2020-03-13 DIAGNOSIS — Z992 Dependence on renal dialysis: Secondary | ICD-10-CM | POA: Diagnosis not present

## 2020-03-13 DIAGNOSIS — J45909 Unspecified asthma, uncomplicated: Secondary | ICD-10-CM

## 2020-03-13 DIAGNOSIS — E1151 Type 2 diabetes mellitus with diabetic peripheral angiopathy without gangrene: Secondary | ICD-10-CM

## 2020-03-13 DIAGNOSIS — E1169 Type 2 diabetes mellitus with other specified complication: Secondary | ICD-10-CM

## 2020-03-13 DIAGNOSIS — Z794 Long term (current) use of insulin: Secondary | ICD-10-CM

## 2020-03-13 DIAGNOSIS — Z4801 Encounter for change or removal of surgical wound dressing: Secondary | ICD-10-CM | POA: Diagnosis not present

## 2020-03-13 DIAGNOSIS — I132 Hypertensive heart and chronic kidney disease with heart failure and with stage 5 chronic kidney disease, or end stage renal disease: Secondary | ICD-10-CM | POA: Diagnosis not present

## 2020-03-13 DIAGNOSIS — Z4781 Encounter for orthopedic aftercare following surgical amputation: Secondary | ICD-10-CM | POA: Diagnosis not present

## 2020-03-13 DIAGNOSIS — M869 Osteomyelitis, unspecified: Secondary | ICD-10-CM

## 2020-03-13 DIAGNOSIS — E785 Hyperlipidemia, unspecified: Secondary | ICD-10-CM

## 2020-03-14 DIAGNOSIS — Z4801 Encounter for change or removal of surgical wound dressing: Secondary | ICD-10-CM | POA: Diagnosis not present

## 2020-03-14 DIAGNOSIS — E1151 Type 2 diabetes mellitus with diabetic peripheral angiopathy without gangrene: Secondary | ICD-10-CM | POA: Diagnosis not present

## 2020-03-14 DIAGNOSIS — E1165 Type 2 diabetes mellitus with hyperglycemia: Secondary | ICD-10-CM | POA: Diagnosis not present

## 2020-03-14 DIAGNOSIS — Z89432 Acquired absence of left foot: Secondary | ICD-10-CM | POA: Diagnosis not present

## 2020-03-14 DIAGNOSIS — Z4781 Encounter for orthopedic aftercare following surgical amputation: Secondary | ICD-10-CM | POA: Diagnosis not present

## 2020-03-14 DIAGNOSIS — I70203 Unspecified atherosclerosis of native arteries of extremities, bilateral legs: Secondary | ICD-10-CM | POA: Diagnosis not present

## 2020-03-15 ENCOUNTER — Other Ambulatory Visit: Payer: Self-pay

## 2020-03-15 ENCOUNTER — Ambulatory Visit (INDEPENDENT_AMBULATORY_CARE_PROVIDER_SITE_OTHER): Payer: Self-pay | Admitting: Physician Assistant

## 2020-03-15 ENCOUNTER — Encounter: Payer: Self-pay | Admitting: Physician Assistant

## 2020-03-15 VITALS — BP 160/70 | HR 93 | Temp 98.3°F | Resp 20 | Ht 64.0 in | Wt 236.0 lb

## 2020-03-15 DIAGNOSIS — N2581 Secondary hyperparathyroidism of renal origin: Secondary | ICD-10-CM | POA: Diagnosis not present

## 2020-03-15 DIAGNOSIS — I739 Peripheral vascular disease, unspecified: Secondary | ICD-10-CM

## 2020-03-15 DIAGNOSIS — Z992 Dependence on renal dialysis: Secondary | ICD-10-CM | POA: Diagnosis not present

## 2020-03-15 DIAGNOSIS — N186 End stage renal disease: Secondary | ICD-10-CM | POA: Diagnosis not present

## 2020-03-15 DIAGNOSIS — D509 Iron deficiency anemia, unspecified: Secondary | ICD-10-CM | POA: Diagnosis not present

## 2020-03-15 DIAGNOSIS — D631 Anemia in chronic kidney disease: Secondary | ICD-10-CM | POA: Diagnosis not present

## 2020-03-15 DIAGNOSIS — T8131XA Disruption of external operation (surgical) wound, not elsewhere classified, initial encounter: Secondary | ICD-10-CM

## 2020-03-15 NOTE — Progress Notes (Addendum)
POST OPERATIVE OFFICE NOTE    CC:  F/u for surgery  HPI:  This is a 64 y.o. female who is s/p Left anterior tibial artery angioplasty (2.5 mm x 220 mm [place])  on 02/23/2020 by Dr. Carlis Abbott.  She has previously undergone a right leg tibial intervention for critical limb ischemia. She then under went TMA by Dr. Stanford Breed on 02/24/2020.  She returns today for incision al check and possible removal of stiches/staples.  She states that the incision seemed to be healing until yesterday.  Now the TMA is smelling bad.       Allergies  Allergen Reactions  . Sulfa Antibiotics Swelling    Current Outpatient Medications  Medication Sig Dispense Refill  . acetaminophen (TYLENOL) 325 MG tablet Take 2 tablets (650 mg total) by mouth every 6 (six) hours as needed for mild pain (or Fever >/= 101). 12 tablet 0  . amLODipine (NORVASC) 10 MG tablet Take 1 tablet (10 mg total) by mouth at bedtime. 30 tablet 0  . aspirin EC 81 MG EC tablet Take 1 tablet (81 mg total) by mouth daily. Swallow whole. 30 tablet 0  . atorvastatin (LIPITOR) 40 MG tablet Take 1 tablet (40 mg total) by mouth daily. (Patient taking differently: Take 40 mg by mouth at bedtime. ) 90 tablet 3  . clopidogrel (PLAVIX) 75 MG tablet Take 1 tablet (75 mg total) by mouth daily. 30 tablet 11  . collagenase (SANTYL) ointment Apply 1 application topically daily. 90 g 0  . diphenhydrAMINE (BENADRYL) 25 MG tablet Take 25 mg by mouth every 6 (six) hours as needed for itching.    . Dulaglutide (TRULICITY) 1.5 JX/9.1YN SOPN Inject 0.5 mLs (1.5 mg total) into the skin once a week. Saturday    . insulin glargine, 1 Unit Dial, (TOUJEO SOLOSTAR) 300 UNIT/ML Solostar Pen Inject 100 Units into the skin in the morning. 30 mL 11  . metoprolol succinate (TOPROL-XL) 100 MG 24 hr tablet TAKE ONE TABLET BY MOUTH DAILY. TAKE WITH OR IMMEDIATELY FOLLOWING A MEAL. (Patient taking differently: Take 100 mg by mouth daily. TAKE ONE TABLET BY MOUTH DAILY. TAKE WITH OR  IMMEDIATELY FOLLOWING A MEAL.) 90 tablet 1  . multivitamin (RENA-VIT) TABS tablet Take 1 tablet by mouth at bedtime.     Marland Kitchen oxyCODONE-acetaminophen (PERCOCET) 5-325 MG tablet Take 1 tablet by mouth every 8 (eight) hours as needed for up to 10 doses for severe pain. 10 tablet 0  . sevelamer carbonate (RENVELA) 800 MG tablet Take 1,600-2,400 mg by mouth See admin instructions. Take 2400 mg with each meal and 1600 mg with each snack     Current Facility-Administered Medications  Medication Dose Route Frequency Provider Last Rate Last Admin  . sodium chloride flush (NS) 0.9 % injection 3 mL  3 mL Intravenous Q12H [name], MD      . sodium chloride flush (NS) 0.9 % injection 3 mL  3 mL Intravenous PRN [name], MD         ROS:  See HPI  Physical Exam:        Incision:   Extremities:  Doppler signals AT/peroneal still intact Lungs: non labored breathing  Heart:  RRR  Assessment/Plan:  This is a 64 y.o. female who is s/p:anterior tibial angioplasty and left TMA. 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  She wishes to wait until after Thanksgiving and she wants to talk to her family.  She is A & O x  3.  She is afebrile without chills.    The angiogram showed patent Femoral, profunda and popliteal flow.  She has sever tibial disease.   She was given 20 oxycodone/apap 1 q 8 03/20/20  -  Roxy Horseman PA-C [facility] [phone]  Clinic MD:  Oneida Alar

## 2020-03-17 DIAGNOSIS — N2581 Secondary hyperparathyroidism of renal origin: Secondary | ICD-10-CM | POA: Diagnosis not present

## 2020-03-17 DIAGNOSIS — Z992 Dependence on renal dialysis: Secondary | ICD-10-CM | POA: Diagnosis not present

## 2020-03-17 DIAGNOSIS — D631 Anemia in chronic kidney disease: Secondary | ICD-10-CM | POA: Diagnosis not present

## 2020-03-17 DIAGNOSIS — N186 End stage renal disease: Secondary | ICD-10-CM | POA: Diagnosis not present

## 2020-03-17 DIAGNOSIS — D509 Iron deficiency anemia, unspecified: Secondary | ICD-10-CM | POA: Diagnosis not present

## 2020-03-20 ENCOUNTER — Telehealth: Payer: Self-pay | Admitting: *Deleted

## 2020-03-20 DIAGNOSIS — Z992 Dependence on renal dialysis: Secondary | ICD-10-CM | POA: Diagnosis not present

## 2020-03-20 DIAGNOSIS — N2581 Secondary hyperparathyroidism of renal origin: Secondary | ICD-10-CM | POA: Diagnosis not present

## 2020-03-20 DIAGNOSIS — D509 Iron deficiency anemia, unspecified: Secondary | ICD-10-CM | POA: Diagnosis not present

## 2020-03-20 DIAGNOSIS — N186 End stage renal disease: Secondary | ICD-10-CM | POA: Diagnosis not present

## 2020-03-20 DIAGNOSIS — D631 Anemia in chronic kidney disease: Secondary | ICD-10-CM | POA: Diagnosis not present

## 2020-03-20 MED ORDER — OXYCODONE-ACETAMINOPHEN 5-325 MG PO TABS: 1.0000 | ORAL_TABLET | Freq: Three times a day (TID) | ORAL | 0 refills | Status: DC | PRN

## 2020-03-20 NOTE — Addendum Note (Signed)
Addended by: Ulyses Amor on: 03/20/2020 12:49 PM   Modules accepted: Orders

## 2020-03-20 NOTE — Telephone Encounter (Signed)
Pt called to discuss wound closure. She can leave the staples and sutures in until she decides about amputation. She is to call back if she develops a fever or weakness. Otherwise she can wait until after thanksgiving to decide. Will discuss pain management with PA in office.

## 2020-03-22 DIAGNOSIS — Z4781 Encounter for orthopedic aftercare following surgical amputation: Secondary | ICD-10-CM | POA: Diagnosis not present

## 2020-03-22 DIAGNOSIS — E1165 Type 2 diabetes mellitus with hyperglycemia: Secondary | ICD-10-CM | POA: Diagnosis not present

## 2020-03-22 DIAGNOSIS — I70203 Unspecified atherosclerosis of native arteries of extremities, bilateral legs: Secondary | ICD-10-CM | POA: Diagnosis not present

## 2020-03-22 DIAGNOSIS — N2581 Secondary hyperparathyroidism of renal origin: Secondary | ICD-10-CM | POA: Diagnosis not present

## 2020-03-22 DIAGNOSIS — Z89432 Acquired absence of left foot: Secondary | ICD-10-CM | POA: Diagnosis not present

## 2020-03-22 DIAGNOSIS — N186 End stage renal disease: Secondary | ICD-10-CM | POA: Diagnosis not present

## 2020-03-22 DIAGNOSIS — D509 Iron deficiency anemia, unspecified: Secondary | ICD-10-CM | POA: Diagnosis not present

## 2020-03-22 DIAGNOSIS — D631 Anemia in chronic kidney disease: Secondary | ICD-10-CM | POA: Diagnosis not present

## 2020-03-22 DIAGNOSIS — Z4801 Encounter for change or removal of surgical wound dressing: Secondary | ICD-10-CM | POA: Diagnosis not present

## 2020-03-22 DIAGNOSIS — E1151 Type 2 diabetes mellitus with diabetic peripheral angiopathy without gangrene: Secondary | ICD-10-CM | POA: Diagnosis not present

## 2020-03-22 DIAGNOSIS — Z992 Dependence on renal dialysis: Secondary | ICD-10-CM | POA: Diagnosis not present

## 2020-03-24 DIAGNOSIS — Z992 Dependence on renal dialysis: Secondary | ICD-10-CM | POA: Diagnosis not present

## 2020-03-24 DIAGNOSIS — N2581 Secondary hyperparathyroidism of renal origin: Secondary | ICD-10-CM | POA: Diagnosis not present

## 2020-03-24 DIAGNOSIS — D509 Iron deficiency anemia, unspecified: Secondary | ICD-10-CM | POA: Diagnosis not present

## 2020-03-24 DIAGNOSIS — N186 End stage renal disease: Secondary | ICD-10-CM | POA: Diagnosis not present

## 2020-03-24 DIAGNOSIS — D631 Anemia in chronic kidney disease: Secondary | ICD-10-CM | POA: Diagnosis not present

## 2020-03-27 ENCOUNTER — Other Ambulatory Visit: Payer: Self-pay

## 2020-03-27 DIAGNOSIS — Z992 Dependence on renal dialysis: Secondary | ICD-10-CM | POA: Diagnosis not present

## 2020-03-27 DIAGNOSIS — N2581 Secondary hyperparathyroidism of renal origin: Secondary | ICD-10-CM | POA: Diagnosis not present

## 2020-03-27 DIAGNOSIS — N186 End stage renal disease: Secondary | ICD-10-CM | POA: Diagnosis not present

## 2020-03-27 DIAGNOSIS — D509 Iron deficiency anemia, unspecified: Secondary | ICD-10-CM | POA: Diagnosis not present

## 2020-03-27 DIAGNOSIS — D631 Anemia in chronic kidney disease: Secondary | ICD-10-CM | POA: Diagnosis not present

## 2020-03-28 DIAGNOSIS — Z992 Dependence on renal dialysis: Secondary | ICD-10-CM | POA: Diagnosis not present

## 2020-03-28 DIAGNOSIS — N186 End stage renal disease: Secondary | ICD-10-CM | POA: Diagnosis not present

## 2020-03-29 DIAGNOSIS — E1122 Type 2 diabetes mellitus with diabetic chronic kidney disease: Secondary | ICD-10-CM | POA: Diagnosis not present

## 2020-03-29 DIAGNOSIS — Z7982 Long term (current) use of aspirin: Secondary | ICD-10-CM | POA: Diagnosis not present

## 2020-03-29 DIAGNOSIS — G709 Myoneural disorder, unspecified: Secondary | ICD-10-CM | POA: Diagnosis not present

## 2020-03-29 DIAGNOSIS — M869 Osteomyelitis, unspecified: Secondary | ICD-10-CM | POA: Diagnosis not present

## 2020-03-29 DIAGNOSIS — E1165 Type 2 diabetes mellitus with hyperglycemia: Secondary | ICD-10-CM | POA: Diagnosis not present

## 2020-03-29 DIAGNOSIS — M1990 Unspecified osteoarthritis, unspecified site: Secondary | ICD-10-CM | POA: Diagnosis not present

## 2020-03-29 DIAGNOSIS — Z992 Dependence on renal dialysis: Secondary | ICD-10-CM | POA: Diagnosis not present

## 2020-03-29 DIAGNOSIS — J45909 Unspecified asthma, uncomplicated: Secondary | ICD-10-CM | POA: Diagnosis not present

## 2020-03-29 DIAGNOSIS — E1169 Type 2 diabetes mellitus with other specified complication: Secondary | ICD-10-CM | POA: Diagnosis not present

## 2020-03-29 DIAGNOSIS — I132 Hypertensive heart and chronic kidney disease with heart failure and with stage 5 chronic kidney disease, or end stage renal disease: Secondary | ICD-10-CM | POA: Diagnosis not present

## 2020-03-29 DIAGNOSIS — T8189XD Other complications of procedures, not elsewhere classified, subsequent encounter: Secondary | ICD-10-CM | POA: Diagnosis not present

## 2020-03-29 DIAGNOSIS — Z87891 Personal history of nicotine dependence: Secondary | ICD-10-CM | POA: Diagnosis not present

## 2020-03-29 DIAGNOSIS — E785 Hyperlipidemia, unspecified: Secondary | ICD-10-CM | POA: Diagnosis not present

## 2020-03-29 DIAGNOSIS — Z79891 Long term (current) use of opiate analgesic: Secondary | ICD-10-CM | POA: Diagnosis not present

## 2020-03-29 DIAGNOSIS — E1151 Type 2 diabetes mellitus with diabetic peripheral angiopathy without gangrene: Secondary | ICD-10-CM | POA: Diagnosis not present

## 2020-03-29 DIAGNOSIS — N2581 Secondary hyperparathyroidism of renal origin: Secondary | ICD-10-CM | POA: Diagnosis not present

## 2020-03-29 DIAGNOSIS — D509 Iron deficiency anemia, unspecified: Secondary | ICD-10-CM | POA: Diagnosis not present

## 2020-03-29 DIAGNOSIS — Z4781 Encounter for orthopedic aftercare following surgical amputation: Secondary | ICD-10-CM | POA: Diagnosis not present

## 2020-03-29 DIAGNOSIS — Z4801 Encounter for change or removal of surgical wound dressing: Secondary | ICD-10-CM | POA: Diagnosis not present

## 2020-03-29 DIAGNOSIS — D631 Anemia in chronic kidney disease: Secondary | ICD-10-CM | POA: Diagnosis not present

## 2020-03-29 DIAGNOSIS — Z89432 Acquired absence of left foot: Secondary | ICD-10-CM | POA: Diagnosis not present

## 2020-03-29 DIAGNOSIS — N186 End stage renal disease: Secondary | ICD-10-CM | POA: Diagnosis not present

## 2020-03-29 DIAGNOSIS — Z7902 Long term (current) use of antithrombotics/antiplatelets: Secondary | ICD-10-CM | POA: Diagnosis not present

## 2020-03-29 DIAGNOSIS — I70203 Unspecified atherosclerosis of native arteries of extremities, bilateral legs: Secondary | ICD-10-CM | POA: Diagnosis not present

## 2020-03-29 DIAGNOSIS — Z794 Long term (current) use of insulin: Secondary | ICD-10-CM | POA: Diagnosis not present

## 2020-03-29 DIAGNOSIS — Z79899 Other long term (current) drug therapy: Secondary | ICD-10-CM | POA: Diagnosis not present

## 2020-03-29 DIAGNOSIS — I509 Heart failure, unspecified: Secondary | ICD-10-CM | POA: Diagnosis not present

## 2020-03-30 ENCOUNTER — Telehealth: Payer: Self-pay

## 2020-03-30 DIAGNOSIS — I70203 Unspecified atherosclerosis of native arteries of extremities, bilateral legs: Secondary | ICD-10-CM | POA: Diagnosis not present

## 2020-03-30 DIAGNOSIS — Z4801 Encounter for change or removal of surgical wound dressing: Secondary | ICD-10-CM | POA: Diagnosis not present

## 2020-03-30 DIAGNOSIS — Z4781 Encounter for orthopedic aftercare following surgical amputation: Secondary | ICD-10-CM | POA: Diagnosis not present

## 2020-03-30 DIAGNOSIS — E1165 Type 2 diabetes mellitus with hyperglycemia: Secondary | ICD-10-CM | POA: Diagnosis not present

## 2020-03-30 DIAGNOSIS — Z89432 Acquired absence of left foot: Secondary | ICD-10-CM | POA: Diagnosis not present

## 2020-03-30 DIAGNOSIS — E1151 Type 2 diabetes mellitus with diabetic peripheral angiopathy without gangrene: Secondary | ICD-10-CM | POA: Diagnosis not present

## 2020-03-30 NOTE — Telephone Encounter (Signed)
Arbie Cookey from [facility] called to ask if she could use betadine on patient's TMA site. Advised that she could; patient scheduled for BKA vs AKA on 12/14.

## 2020-03-31 DIAGNOSIS — N2581 Secondary hyperparathyroidism of renal origin: Secondary | ICD-10-CM | POA: Diagnosis not present

## 2020-03-31 DIAGNOSIS — D509 Iron deficiency anemia, unspecified: Secondary | ICD-10-CM | POA: Diagnosis not present

## 2020-03-31 DIAGNOSIS — D631 Anemia in chronic kidney disease: Secondary | ICD-10-CM | POA: Diagnosis not present

## 2020-03-31 DIAGNOSIS — N186 End stage renal disease: Secondary | ICD-10-CM | POA: Diagnosis not present

## 2020-03-31 DIAGNOSIS — Z992 Dependence on renal dialysis: Secondary | ICD-10-CM | POA: Diagnosis not present

## 2020-04-03 ENCOUNTER — Telehealth: Payer: Self-pay

## 2020-04-03 ENCOUNTER — Ambulatory Visit: Payer: Medicare Other | Admitting: Licensed Clinical Social Worker

## 2020-04-03 DIAGNOSIS — N2581 Secondary hyperparathyroidism of renal origin: Secondary | ICD-10-CM | POA: Diagnosis not present

## 2020-04-03 DIAGNOSIS — N186 End stage renal disease: Secondary | ICD-10-CM | POA: Diagnosis not present

## 2020-04-03 DIAGNOSIS — I151 Hypertension secondary to other renal disorders: Secondary | ICD-10-CM

## 2020-04-03 DIAGNOSIS — J4530 Mild persistent asthma, uncomplicated: Secondary | ICD-10-CM

## 2020-04-03 DIAGNOSIS — D509 Iron deficiency anemia, unspecified: Secondary | ICD-10-CM | POA: Diagnosis not present

## 2020-04-03 DIAGNOSIS — Z992 Dependence on renal dialysis: Secondary | ICD-10-CM | POA: Diagnosis not present

## 2020-04-03 DIAGNOSIS — D631 Anemia in chronic kidney disease: Secondary | ICD-10-CM | POA: Diagnosis not present

## 2020-04-03 NOTE — Telephone Encounter (Signed)
Patient had questions about surgery. Left VM for her to call us back.

## 2020-04-03 NOTE — Telephone Encounter (Signed)
Pt called requesting to see wound care before moving forward with surgery. She had seen them in the past and is going to try to schedule an appt with them to get their opinion. She will call us back if she has further questions/concerns.

## 2020-04-03 NOTE — Chronic Care Management (AMB) (Signed)
Chronic Care Management    Clinical Social Work Follow Up Note  04/03/2020 Name: CHAMIA SCHMUTZ MRN: [id number] DOB: [birth year]  RAEGAN WINDERS is a 64 y.o. year old female who is a primary care patient of [name], [name], MD. The CCM team was consulted for assistance with [name] .   Review of patient status, including review of consultants reports, other relevant assessments, and collaboration with appropriate care team members and the patient's provider was performed as part of comprehensive patient evaluation and provision of chronic care management services.    SDOH (Social Determinants of Health) assessments performed: No; risk for depression; risk for tobacco use; risk for stress; risk for physical inactivity    Chronic Care Management from 10/12/2019 in [facility]  PHQ-9 Total Score 5      Outpatient Encounter Medications as of 04/03/2020  Medication Sig  . acetaminophen (TYLENOL) 325 MG tablet Take 2 tablets (650 mg total) by mouth every 6 (six) hours as needed for mild pain (or Fever >/= 101).  Marland Kitchen amLODipine (NORVASC) 10 MG tablet Take 1 tablet (10 mg total) by mouth at bedtime.  Marland Kitchen atorvastatin (LIPITOR) 40 MG tablet Take 1 tablet (40 mg total) by mouth daily. (Patient taking differently: Take 40 mg by mouth at bedtime. )  . clopidogrel (PLAVIX) 75 MG tablet Take 1 tablet (75 mg total) by mouth daily.  . collagenase (SANTYL) ointment Apply 1 application topically daily.  . diphenhydrAMINE (BENADRYL) 25 MG tablet Take 25 mg by mouth every 6 (six) hours as needed for itching.  . Dulaglutide (TRULICITY) 1.5 MW/1.0UV SOPN Inject 0.5 mLs (1.5 mg total) into the skin once a week. Saturday  . insulin glargine, 1 Unit Dial, (TOUJEO SOLOSTAR) 300 UNIT/ML Solostar Pen Inject 100 Units into the skin in the morning.  . metoprolol succinate (TOPROL-XL) 100 MG 24 hr tablet TAKE ONE TABLET BY MOUTH DAILY. TAKE WITH OR IMMEDIATELY FOLLOWING A MEAL. (Patient  taking differently: Take 100 mg by mouth daily. TAKE ONE TABLET BY MOUTH DAILY. TAKE WITH OR IMMEDIATELY FOLLOWING A MEAL.)  . multivitamin (RENA-VIT) TABS tablet Take 1 tablet by mouth at bedtime.   Marland Kitchen oxyCODONE-acetaminophen (PERCOCET) 5-325 MG tablet Take 1 tablet by mouth every 8 (eight) hours as needed for up to 10 doses for severe pain.  Marland Kitchen oxyCODONE-acetaminophen (PERCOCET) 5-325 MG tablet Take 1 tablet by mouth every 8 (eight) hours as needed for severe pain.  . sevelamer carbonate (RENVELA) 800 MG tablet Take 1,600-2,400 mg by mouth See admin instructions. Take 2400 mg with each meal and 1600 mg with each snack   Facility-Administered Encounter Medications as of 04/03/2020  Medication  . sodium chloride flush (NS) 0.9 % injection 3 mL  . sodium chloride flush (NS) 0.9 % injection 3 mL    LCSW called client home phone number several times today but LCSW was not able to speak via phone with client today. LCSW left phone message for client today asking that she return call to LCSW at [phone]. LCSW also called phone number for [name] , daughter of client, but LCSW was not able to speak via phone with [facility] today. LCSW left phone message for [facility] requesting that she please return call to LCSW at [phone]  Follow Up Plan: LCSW to call [name] next 4 weeks to talk about clientmanagement of health issues and to talk withclient or [name] about clientcompletion of daily activities  Norva Riffle.Massie Mees MSW, [employer] Licensed Holiday representative [place]  [facility] Care Management [phone]

## 2020-04-03 NOTE — Patient Instructions (Signed)
Licensed Clinical Social Worker Visit Information  Materials Provided: No  04/03/2020  Name: Christy Zhang        MRN: [id number]       DOB: 1955/05/17  Christy Zhang is a 64 y.o. year old female who is a primary care patient of Christy Zhang. The CCM team was consulted for assistance with [name] .   Review of patient status, including review of consultants reports, other relevant assessments, and collaboration with appropriate care team members and the patient's provider was performed as part of comprehensive patient evaluation and provision of chronic care management services.    SDOH (Social Determinants of Health) assessments performed: No; risk for depression; risk for tobacco use; risk for stress; risk for physical inactivity   LCSW called client home phone number several times today but LCSW was not able to speak via phone with client today. LCSW left phone message for client today asking that she return call to LCSW at [phone]. LCSW also called phone number for Christy Zhang , daughter of client, but LCSW was not able to speak via phone with [facility] today. LCSW left phone message for Christy Zhang requesting that she please return call to LCSW at [phone]  Follow Up Plan: LCSW to call clientor daughter Christy Zhang next 4 weeks to talk about clientmanagement of health issues and to talk withclient or Christy Zhang about clientcompletion of daily activities  LCSW was not able to speak via phone today with client or daughter of client; thus the client or her daughter were not able to verbalize understanding of instructions provided today and were not able to accept or decline a print copy of patient instruction materials.   Christy Zhang.Little Bashore MSW, LCSW Licensed Clinical Social Worker [place] [facility] Care Management [phone]

## 2020-04-05 DIAGNOSIS — D631 Anemia in chronic kidney disease: Secondary | ICD-10-CM | POA: Diagnosis not present

## 2020-04-05 DIAGNOSIS — D509 Iron deficiency anemia, unspecified: Secondary | ICD-10-CM | POA: Diagnosis not present

## 2020-04-05 DIAGNOSIS — Z992 Dependence on renal dialysis: Secondary | ICD-10-CM | POA: Diagnosis not present

## 2020-04-05 DIAGNOSIS — N186 End stage renal disease: Secondary | ICD-10-CM | POA: Diagnosis not present

## 2020-04-05 DIAGNOSIS — N2581 Secondary hyperparathyroidism of renal origin: Secondary | ICD-10-CM | POA: Diagnosis not present

## 2020-04-06 ENCOUNTER — Encounter: Payer: Self-pay | Admitting: Family Medicine

## 2020-04-06 ENCOUNTER — Other Ambulatory Visit: Payer: Self-pay

## 2020-04-06 ENCOUNTER — Ambulatory Visit (INDEPENDENT_AMBULATORY_CARE_PROVIDER_SITE_OTHER): Payer: Medicare Other | Admitting: Family Medicine

## 2020-04-06 VITALS — BP 139/76 | HR 79 | Temp 97.3°F | Ht 64.0 in | Wt 235.0 lb

## 2020-04-06 DIAGNOSIS — N2889 Other specified disorders of kidney and ureter: Secondary | ICD-10-CM | POA: Diagnosis not present

## 2020-04-06 DIAGNOSIS — Z794 Long term (current) use of insulin: Secondary | ICD-10-CM | POA: Diagnosis not present

## 2020-04-06 DIAGNOSIS — N186 End stage renal disease: Secondary | ICD-10-CM | POA: Diagnosis not present

## 2020-04-06 DIAGNOSIS — I151 Hypertension secondary to other renal disorders: Secondary | ICD-10-CM

## 2020-04-06 DIAGNOSIS — E1169 Type 2 diabetes mellitus with other specified complication: Secondary | ICD-10-CM | POA: Diagnosis not present

## 2020-04-06 DIAGNOSIS — Z992 Dependence on renal dialysis: Secondary | ICD-10-CM

## 2020-04-06 DIAGNOSIS — E1122 Type 2 diabetes mellitus with diabetic chronic kidney disease: Secondary | ICD-10-CM

## 2020-04-06 DIAGNOSIS — E785 Hyperlipidemia, unspecified: Secondary | ICD-10-CM | POA: Diagnosis not present

## 2020-04-06 DIAGNOSIS — Z1159 Encounter for screening for other viral diseases: Secondary | ICD-10-CM

## 2020-04-06 LAB — BAYER DCA HB A1C WAIVED: HB A1C (BAYER DCA - WAIVED): 5.4 % (ref <7.0)

## 2020-04-07 DIAGNOSIS — Z4781 Encounter for orthopedic aftercare following surgical amputation: Secondary | ICD-10-CM | POA: Diagnosis not present

## 2020-04-07 DIAGNOSIS — Z992 Dependence on renal dialysis: Secondary | ICD-10-CM | POA: Diagnosis not present

## 2020-04-07 DIAGNOSIS — E1151 Type 2 diabetes mellitus with diabetic peripheral angiopathy without gangrene: Secondary | ICD-10-CM | POA: Diagnosis not present

## 2020-04-07 DIAGNOSIS — D509 Iron deficiency anemia, unspecified: Secondary | ICD-10-CM | POA: Diagnosis not present

## 2020-04-07 DIAGNOSIS — N186 End stage renal disease: Secondary | ICD-10-CM | POA: Diagnosis not present

## 2020-04-07 DIAGNOSIS — Z89432 Acquired absence of left foot: Secondary | ICD-10-CM | POA: Diagnosis not present

## 2020-04-07 DIAGNOSIS — Z4801 Encounter for change or removal of surgical wound dressing: Secondary | ICD-10-CM | POA: Diagnosis not present

## 2020-04-07 DIAGNOSIS — N2581 Secondary hyperparathyroidism of renal origin: Secondary | ICD-10-CM | POA: Diagnosis not present

## 2020-04-07 DIAGNOSIS — D631 Anemia in chronic kidney disease: Secondary | ICD-10-CM | POA: Diagnosis not present

## 2020-04-07 DIAGNOSIS — E1165 Type 2 diabetes mellitus with hyperglycemia: Secondary | ICD-10-CM | POA: Diagnosis not present

## 2020-04-07 DIAGNOSIS — I70203 Unspecified atherosclerosis of native arteries of extremities, bilateral legs: Secondary | ICD-10-CM | POA: Diagnosis not present

## 2020-04-07 LAB — CMP14+EGFR
ALT: 7 IU/L (ref 0–32)
AST: 5 IU/L (ref 0–40)
Albumin/Globulin Ratio: 0.9 — ABNORMAL LOW (ref 1.2–2.2)
Albumin: 3.2 g/dL — ABNORMAL LOW (ref 3.8–4.8)
Alkaline Phosphatase: 68 IU/L (ref 44–121)
BUN/Creatinine Ratio: 3 — ABNORMAL LOW (ref 12–28)
BUN: 17 mg/dL (ref 8–27)
Bilirubin Total: 0.4 mg/dL (ref 0.0–1.2)
CO2: 24 mmol/L (ref 20–29)
Calcium: 8.9 mg/dL (ref 8.7–10.3)
Chloride: 95 mmol/L — ABNORMAL LOW (ref 96–106)
Creatinine, Ser: 5.87 mg/dL (ref 0.57–1.00)
GFR calc Af Amer: 8 mL/min/{1.73_m2} — ABNORMAL LOW (ref >59)
GFR calc non Af Amer: 7 mL/min/{1.73_m2} — ABNORMAL LOW (ref >59)
Globulin, Total: 3.7 g/dL (ref 1.5–4.5)
Glucose: 81 mg/dL (ref 65–99)
Potassium: 4.5 mmol/L (ref 3.5–5.2)
Sodium: 136 mmol/L (ref 134–144)
Total Protein: 6.9 g/dL (ref 6.0–8.5)

## 2020-04-07 LAB — LIPID PANEL
Chol/HDL Ratio: 1.8 ratio (ref 0.0–4.4)
Cholesterol, Total: 78 mg/dL — ABNORMAL LOW (ref 100–199)
HDL: 43 mg/dL (ref >39)
LDL Chol Calc (NIH): 20 mg/dL (ref 0–99)
Triglycerides: 65 mg/dL (ref 0–149)
VLDL Cholesterol Cal: 15 mg/dL (ref 5–40)

## 2020-04-07 LAB — CBC WITH DIFFERENTIAL/PLATELET
Basophils Absolute: 0.1 10*3/uL (ref 0.0–0.2)
Basos: 0 %
EOS (ABSOLUTE): 0.7 10*3/uL — ABNORMAL HIGH (ref 0.0–0.4)
Eos: 4 %
Hematocrit: 31.5 % — ABNORMAL LOW (ref 34.0–46.6)
Hemoglobin: 9.6 g/dL — ABNORMAL LOW (ref 11.1–15.9)
Immature Grans (Abs): 0.1 10*3/uL (ref 0.0–0.1)
Immature Granulocytes: 1 %
Lymphocytes Absolute: 2.3 10*3/uL (ref 0.7–3.1)
Lymphs: 15 %
MCH: 25.7 pg — ABNORMAL LOW (ref 26.6–33.0)
MCHC: 30.5 g/dL — ABNORMAL LOW (ref 31.5–35.7)
MCV: 84 fL (ref 79–97)
Monocytes Absolute: 0.9 10*3/uL (ref 0.1–0.9)
Monocytes: 6 %
Neutrophils Absolute: 11.9 10*3/uL — ABNORMAL HIGH (ref 1.4–7.0)
Neutrophils: 74 %
Platelets: 701 10*3/uL — ABNORMAL HIGH (ref 150–450)
RBC: 3.74 x10E6/uL — ABNORMAL LOW (ref 3.77–5.28)
RDW: 15.5 % — ABNORMAL HIGH (ref 11.7–15.4)
WBC: 15.9 10*3/uL — ABNORMAL HIGH (ref 3.4–10.8)

## 2020-04-07 LAB — HEPATITIS C ANTIBODY: Hep C Virus Ab: <0.1 s/co ratio (ref 0.0–0.9)

## 2020-04-09 ENCOUNTER — Encounter: Payer: Self-pay | Admitting: Family Medicine

## 2020-04-09 MED ORDER — TRULICITY 1.5 MG/0.5ML ~~LOC~~ SOAJ: 1.5000 mg | SUBCUTANEOUS | Status: DC

## 2020-04-09 MED ORDER — AMLODIPINE BESYLATE 10 MG PO TABS: 10.0000 mg | ORAL_TABLET | Freq: Every day | ORAL | 0 refills | Status: DC

## 2020-04-09 MED ORDER — METOPROLOL SUCCINATE ER 100 MG PO TB24: 100.0000 mg | ORAL_TABLET | Freq: Every day | ORAL | 1 refills | Status: DC

## 2020-04-09 NOTE — Progress Notes (Signed)
Subjective:  Patient ID: Christy Zhang, female    DOB: Jul 02, 1955  Age: 64 y.o. MRN: [id number]  CC: Follow-up (3 month/)   HPI Christy Zhang presents forFollow-up of diabetes. Patient checks blood sugar at home.  Patient denies symptoms such as polyuria, polydipsia, excessive hunger, nausea No significant hypoglycemic spells noted. Medications reviewed. Pt reports taking them regularly without complication/adverse reaction being reported today.  Patient undergoes dialysis every Tuesday Thursday Saturday.  She also recently had a transmetatarsal amputation of the left foot.  History Christy Zhang has a past medical history of Anemia, Arthritis, Asthma, CHF (congestive heart failure) ([place]), Chronic kidney disease, Diabetes mellitus with complication ([place]), End stage renal disease ([place]), End stage renal disease on dialysis [facility]), Hemodialysis access, arteriovenous graft ([place]), Hyperlipidemia, Hypertension, and Neuromuscular disorder ([place]).   She has a past surgical history that includes Insertion of dialysis catheter; Tubal ligation; AV fistula placement (Right, 08/25/2014); Fistula superficialization (Right, 11/03/2014); Ligation of competing branches of arteriovenous fistula (Right, 11/03/2014); Fistulogram (Right, 01/16/2016); A/V Fistulagram (Right, 02/04/2017); PERIPHERAL VASCULAR BALLOON ANGIOPLASTY (Right, 02/04/2017); IR THROMBECTOMY AV FISTULA/W THROMBOLYSIS/PTA INC SHUNT/IMG RIGHT (Right, 12/03/2018); IR US Guide Vasc Access Right (12/03/2018); IR Fluoro Guide CV Line Right (12/03/2018); IR US Guide Vasc Access Right (12/03/2018); Bascilic vein transposition (Right, 12/24/2018); AV fistula placement (Right, 03/18/2019); IR Removal Tun Cv Cath W/O FL (07/06/2019); IR THROMBECTOMY AV FISTULA W/THROMBOLYSIS/PTA/STENT INC SHUNT/IMG RT (Right, 10/11/2019); IR US Guide Vasc Access Right (10/14/2019); ABDOMINAL AORTOGRAM W/LOWER EXTREMITY (N/A, 11/18/2019); PERIPHERAL VASCULAR BALLOON ANGIOPLASTY (Right,  11/18/2019); ABDOMINAL AORTOGRAM W/LOWER EXTREMITY (N/A, 02/23/2020); PERIPHERAL VASCULAR BALLOON ANGIOPLASTY (02/23/2020); and Transmetatarsal amputation (Left, 02/24/2020).   Her family history includes Cancer in her father; Diabetes in her daughter, father, mother, and son; Heart disease in her daughter and son; Hypertension in her daughter, mother, sister, and son; Peripheral vascular disease in her son.She reports that she quit smoking about 35 years ago. Her smoking use included cigarettes. She has never used smokeless tobacco. She reports that she does not drink alcohol and does not use drugs.  Current Outpatient Medications on File Prior to Visit  Medication Sig Dispense Refill   acetaminophen (TYLENOL) 325 MG tablet Take 2 tablets (650 mg total) by mouth every 6 (six) hours as needed for mild pain (or Fever >/= 101). 12 tablet 0   atorvastatin (LIPITOR) 40 MG tablet Take 1 tablet (40 mg total) by mouth daily. (Patient taking differently: Take 40 mg by mouth at bedtime.) 90 tablet 3   clopidogrel (PLAVIX) 75 MG tablet Take 1 tablet (75 mg total) by mouth daily. 30 tablet 11   diphenhydrAMINE (BENADRYL) 25 MG tablet Take 25 mg by mouth every 6 (six) hours as needed for itching.     insulin glargine, 1 Unit Dial, (TOUJEO SOLOSTAR) 300 UNIT/ML Solostar Pen Inject 100 Units into the skin in the morning. 30 mL 11   multivitamin (RENA-VIT) TABS tablet Take 1 tablet by mouth at bedtime.      oxyCODONE-acetaminophen (PERCOCET) 5-325 MG tablet Take 1 tablet by mouth every 8 (eight) hours as needed for up to 10 doses for severe pain. 10 tablet 0   oxyCODONE-acetaminophen (PERCOCET) 5-325 MG tablet Take 1 tablet by mouth every 8 (eight) hours as needed for severe pain. 20 tablet 0   sevelamer carbonate (RENVELA) 800 MG tablet Take 1,600-2,400 mg by mouth See admin instructions. Take 2400 mg with each meal and 1600 mg with each snack     collagenase (SANTYL) ointment Apply 1 application topically  daily. (Patient not taking: Reported on 04/06/2020) 90 g 0   Current Facility-Administered Medications on File Prior to Visit  Medication Dose Route Frequency Provider Last Rate Last Admin   sodium chloride flush (NS) 0.9 % injection 3 mL  3 mL Intravenous Q12H [name], MD       sodium chloride flush (NS) 0.9 % injection 3 mL  3 mL Intravenous PRN [name], MD        ROS Review of Systems  Constitutional: Negative.   HENT: Negative.   Eyes: Negative for visual disturbance.  Respiratory: Negative for shortness of breath.   Cardiovascular: Negative for chest pain.  Gastrointestinal: Negative for abdominal pain.  Musculoskeletal: Positive for arthralgias.    Objective:  BP 139/76    Pulse 79    Temp (!) 97.3 F (36.3 C) (Temporal)    Ht _0  (1.626 m)    Wt 235 lb (106.6 kg)    LMP  (LMP Unknown)    BMI 40.34 kg/m   BP Readings from Last 3 Encounters:  04/06/20 139/76  03/15/20 (!) 160/70  03/09/20 (!) 152/75    Wt Readings from Last 3 Encounters:  04/06/20 235 lb (106.6 kg)  03/15/20 236 lb (107 kg)  02/25/20 232 lb 5.8 oz (105.4 kg)     Physical Exam Constitutional:      General: She is not in acute distress.    Appearance: She is well-developed and well-nourished.  HENT:     Head: Normocephalic and atraumatic.  Eyes:     Conjunctiva/sclera: Conjunctivae normal.     Pupils: Pupils are equal, round, and reactive to light.  Neck:     Thyroid: No thyromegaly.  Cardiovascular:     Rate and Rhythm: Normal rate and regular rhythm.     Heart sounds: Normal heart sounds. No murmur heard.   Pulmonary:     Effort: Pulmonary effort is normal. No respiratory distress.     Breath sounds: Normal breath sounds. No wheezing or rales.  Abdominal:     General: Bowel sounds are normal. There is no distension.     Palpations: Abdomen is soft.     Tenderness: There is no abdominal tenderness.  Musculoskeletal:        General: Normal range of motion.      Cervical back: Normal range of motion and neck supple.  Lymphadenopathy:     Cervical: No cervical adenopathy.  Skin:    General: Skin is warm and dry.  Neurological:     Mental Status: She is alert and oriented to person, place, and time.  Psychiatric:        Mood and Affect: Mood and affect normal.        Behavior: Behavior normal.        Thought Content: Thought content normal.        Judgment: Judgment normal.       Assessment & Plan:   Briell was seen today for follow-up.  Diagnoses and all orders for this visit:  Hypertension secondary to other renal disorders -     CBC with Differential/Platelet -     CMP14+EGFR -     Lipid panel -     metoprolol succinate (TOPROL-XL) 100 MG 24 hr tablet; Take 1 tablet (100 mg total) by mouth daily. TAKE ONE TABLET BY MOUTH DAILY. TAKE WITH OR IMMEDIATELY FOLLOWING A MEAL. -     amLODipine (NORVASC) 10 MG tablet; Take 1 tablet (10 mg total) by mouth  at bedtime.  Controlled type 2 diabetes mellitus with chronic kidney disease on chronic dialysis, with long-term current use of insulin (HCC) -     Microalbumin / creatinine urine ratio -     Bayer DCA Hb A1c Waived -     CBC with Differential/Platelet -     CMP14+EGFR -     Lipid panel -     Dulaglutide (TRULICITY) 1.5 VN/5.0CH SOPN; Inject 1.5 mg into the skin once a week. Saturday  Hyperlipidemia associated with type 2 diabetes mellitus ([place]) -     CBC with Differential/Platelet -     CMP14+EGFR -     Lipid panel  Encounter for hepatitis C screening test for low risk patient -     Hepatitis C antibody      I have changed Jody B. Kotlyar's metoprolol succinate and Trulicity. I am also having her maintain her sevelamer carbonate, multivitamin, atorvastatin, acetaminophen, diphenhydrAMINE, clopidogrel, Santyl, Toujeo SoloStar, oxyCODONE-acetaminophen, oxyCODONE-acetaminophen, and amLODipine. We will continue to administer sodium chloride flush and sodium chloride  flush.  Meds ordered this encounter  Medications   metoprolol succinate (TOPROL-XL) 100 MG 24 hr tablet    Sig: Take 1 tablet (100 mg total) by mouth daily. TAKE ONE TABLET BY MOUTH DAILY. TAKE WITH OR IMMEDIATELY FOLLOWING A MEAL.    Dispense:  90 tablet    Refill:  1   Dulaglutide (TRULICITY) 1.5 JS/4.3IP SOPN    Sig: Inject 1.5 mg into the skin once a week. Saturday   amLODipine (NORVASC) 10 MG tablet    Sig: Take 1 tablet (10 mg total) by mouth at bedtime.    Dispense:  30 tablet    Refill:  0     Follow-up: Return in about 3 months (around 07/05/2020).  Claretta Fraise, M.D.

## 2020-04-10 ENCOUNTER — Other Ambulatory Visit (HOSPITAL_COMMUNITY): Payer: Medicare Other

## 2020-04-10 ENCOUNTER — Telehealth: Payer: Self-pay

## 2020-04-10 DIAGNOSIS — E1165 Type 2 diabetes mellitus with hyperglycemia: Secondary | ICD-10-CM | POA: Diagnosis not present

## 2020-04-10 DIAGNOSIS — E1151 Type 2 diabetes mellitus with diabetic peripheral angiopathy without gangrene: Secondary | ICD-10-CM | POA: Diagnosis not present

## 2020-04-10 DIAGNOSIS — Z992 Dependence on renal dialysis: Secondary | ICD-10-CM | POA: Diagnosis not present

## 2020-04-10 DIAGNOSIS — D509 Iron deficiency anemia, unspecified: Secondary | ICD-10-CM | POA: Diagnosis not present

## 2020-04-10 DIAGNOSIS — I70203 Unspecified atherosclerosis of native arteries of extremities, bilateral legs: Secondary | ICD-10-CM | POA: Diagnosis not present

## 2020-04-10 DIAGNOSIS — N186 End stage renal disease: Secondary | ICD-10-CM | POA: Diagnosis not present

## 2020-04-10 DIAGNOSIS — D631 Anemia in chronic kidney disease: Secondary | ICD-10-CM | POA: Diagnosis not present

## 2020-04-10 DIAGNOSIS — Z89432 Acquired absence of left foot: Secondary | ICD-10-CM | POA: Diagnosis not present

## 2020-04-10 DIAGNOSIS — N2581 Secondary hyperparathyroidism of renal origin: Secondary | ICD-10-CM | POA: Diagnosis not present

## 2020-04-10 DIAGNOSIS — Z4801 Encounter for change or removal of surgical wound dressing: Secondary | ICD-10-CM | POA: Diagnosis not present

## 2020-04-10 DIAGNOSIS — Z4781 Encounter for orthopedic aftercare following surgical amputation: Secondary | ICD-10-CM | POA: Diagnosis not present

## 2020-04-10 NOTE — Telephone Encounter (Signed)
Patient left vm requesting a callback. Attempted to reach back at number provided. Left message to return call.

## 2020-04-10 NOTE — Telephone Encounter (Signed)
Pt requested to cancel right BKA vs AKA surgery scheduled on tomorrow with Dr. Scot Dock. Stated that she would like to wait until after her wound center appt on 04/13/20 to find out recommendations and then will call back afterwards.

## 2020-04-11 ENCOUNTER — Encounter (HOSPITAL_COMMUNITY): Admission: RE | Payer: Self-pay | Source: Home / Self Care

## 2020-04-11 ENCOUNTER — Inpatient Hospital Stay (HOSPITAL_COMMUNITY): Admission: RE | Admit: 2020-04-11 | Payer: Medicare Other | Source: Home / Self Care | Admitting: Vascular Surgery

## 2020-04-11 SURGERY — AMPUTATION, ABOVE KNEE
Anesthesia: General | Site: Knee | Laterality: Right

## 2020-04-12 DIAGNOSIS — Z992 Dependence on renal dialysis: Secondary | ICD-10-CM | POA: Diagnosis not present

## 2020-04-12 DIAGNOSIS — N2581 Secondary hyperparathyroidism of renal origin: Secondary | ICD-10-CM | POA: Diagnosis not present

## 2020-04-12 DIAGNOSIS — D509 Iron deficiency anemia, unspecified: Secondary | ICD-10-CM | POA: Diagnosis not present

## 2020-04-12 DIAGNOSIS — D631 Anemia in chronic kidney disease: Secondary | ICD-10-CM | POA: Diagnosis not present

## 2020-04-12 DIAGNOSIS — N186 End stage renal disease: Secondary | ICD-10-CM | POA: Diagnosis not present

## 2020-04-12 NOTE — Progress Notes (Signed)
Hello Christy Zhang,  Your lab result is normal and/or stable.Some minor variations that are not significant are commonly marked abnormal, but do not represent any medical problem for you.  Best regards, Claretta Fraise, M.D.

## 2020-04-13 DIAGNOSIS — L97519 Non-pressure chronic ulcer of other part of right foot with unspecified severity: Secondary | ICD-10-CM | POA: Diagnosis not present

## 2020-04-13 DIAGNOSIS — L98492 Non-pressure chronic ulcer of skin of other sites with fat layer exposed: Secondary | ICD-10-CM | POA: Diagnosis not present

## 2020-04-13 DIAGNOSIS — I739 Peripheral vascular disease, unspecified: Secondary | ICD-10-CM | POA: Insufficient documentation

## 2020-04-13 DIAGNOSIS — T8789 Other complications of amputation stump: Secondary | ICD-10-CM | POA: Diagnosis not present

## 2020-04-14 ENCOUNTER — Other Ambulatory Visit: Payer: Self-pay

## 2020-04-14 ENCOUNTER — Telehealth: Payer: Self-pay

## 2020-04-14 DIAGNOSIS — D631 Anemia in chronic kidney disease: Secondary | ICD-10-CM | POA: Diagnosis not present

## 2020-04-14 DIAGNOSIS — N2581 Secondary hyperparathyroidism of renal origin: Secondary | ICD-10-CM | POA: Diagnosis not present

## 2020-04-14 DIAGNOSIS — Z992 Dependence on renal dialysis: Secondary | ICD-10-CM | POA: Diagnosis not present

## 2020-04-14 DIAGNOSIS — N186 End stage renal disease: Secondary | ICD-10-CM | POA: Diagnosis not present

## 2020-04-14 DIAGNOSIS — D509 Iron deficiency anemia, unspecified: Secondary | ICD-10-CM | POA: Diagnosis not present

## 2020-04-14 NOTE — Progress Notes (Signed)
Error

## 2020-04-14 NOTE — Telephone Encounter (Signed)
Patient left VM requesting a call back. Attempted to call back at number provided and number in chart. Unsuccessful. Left VM for patient to call back

## 2020-04-17 DIAGNOSIS — D509 Iron deficiency anemia, unspecified: Secondary | ICD-10-CM | POA: Diagnosis not present

## 2020-04-17 DIAGNOSIS — D631 Anemia in chronic kidney disease: Secondary | ICD-10-CM | POA: Diagnosis not present

## 2020-04-17 DIAGNOSIS — N186 End stage renal disease: Secondary | ICD-10-CM | POA: Diagnosis not present

## 2020-04-17 DIAGNOSIS — Z992 Dependence on renal dialysis: Secondary | ICD-10-CM | POA: Diagnosis not present

## 2020-04-17 DIAGNOSIS — N2581 Secondary hyperparathyroidism of renal origin: Secondary | ICD-10-CM | POA: Diagnosis not present

## 2020-04-19 DIAGNOSIS — N2581 Secondary hyperparathyroidism of renal origin: Secondary | ICD-10-CM | POA: Diagnosis not present

## 2020-04-19 DIAGNOSIS — N186 End stage renal disease: Secondary | ICD-10-CM | POA: Diagnosis not present

## 2020-04-19 DIAGNOSIS — D509 Iron deficiency anemia, unspecified: Secondary | ICD-10-CM | POA: Diagnosis not present

## 2020-04-19 DIAGNOSIS — D631 Anemia in chronic kidney disease: Secondary | ICD-10-CM | POA: Diagnosis not present

## 2020-04-19 DIAGNOSIS — Z992 Dependence on renal dialysis: Secondary | ICD-10-CM | POA: Diagnosis not present

## 2020-04-21 DIAGNOSIS — Z992 Dependence on renal dialysis: Secondary | ICD-10-CM | POA: Diagnosis not present

## 2020-04-21 DIAGNOSIS — D509 Iron deficiency anemia, unspecified: Secondary | ICD-10-CM | POA: Diagnosis not present

## 2020-04-21 DIAGNOSIS — D631 Anemia in chronic kidney disease: Secondary | ICD-10-CM | POA: Diagnosis not present

## 2020-04-21 DIAGNOSIS — N186 End stage renal disease: Secondary | ICD-10-CM | POA: Diagnosis not present

## 2020-04-21 DIAGNOSIS — N2581 Secondary hyperparathyroidism of renal origin: Secondary | ICD-10-CM | POA: Diagnosis not present

## 2020-04-24 ENCOUNTER — Encounter (HOSPITAL_COMMUNITY): Payer: Self-pay | Admitting: Vascular Surgery

## 2020-04-24 DIAGNOSIS — N186 End stage renal disease: Secondary | ICD-10-CM | POA: Diagnosis not present

## 2020-04-24 DIAGNOSIS — D509 Iron deficiency anemia, unspecified: Secondary | ICD-10-CM | POA: Diagnosis not present

## 2020-04-24 DIAGNOSIS — D631 Anemia in chronic kidney disease: Secondary | ICD-10-CM | POA: Diagnosis not present

## 2020-04-24 DIAGNOSIS — Z794 Long term (current) use of insulin: Secondary | ICD-10-CM | POA: Diagnosis not present

## 2020-04-24 DIAGNOSIS — N2581 Secondary hyperparathyroidism of renal origin: Secondary | ICD-10-CM | POA: Diagnosis not present

## 2020-04-24 DIAGNOSIS — E119 Type 2 diabetes mellitus without complications: Secondary | ICD-10-CM | POA: Diagnosis not present

## 2020-04-24 DIAGNOSIS — Z992 Dependence on renal dialysis: Secondary | ICD-10-CM | POA: Diagnosis not present

## 2020-04-24 NOTE — Anesthesia Preprocedure Evaluation (Deleted)
Anesthesia Evaluation    Airway        Dental   Pulmonary former smoker,           Cardiovascular hypertension,      Neuro/Psych    GI/Hepatic   Endo/Other  diabetes  Renal/GU      Musculoskeletal   Abdominal   Peds  Hematology   Anesthesia Other Findings   Reproductive/Obstetrics                             Anesthesia Physical Anesthesia Plan  ASA:   Anesthesia Plan:    Post-op Pain Management:    Induction:   PONV Risk Score and Plan:   Airway Management Planned:   Additional Equipment:   Intra-op Plan:   Post-operative Plan:   Informed Consent:   Plan Discussed with:   Anesthesia Plan Comments: (PAT note written by Myra Gianotti, PA-C. + COVID-19 test 05/02/20. )       Anesthesia Quick Evaluation

## 2020-04-24 NOTE — Progress Notes (Addendum)
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 Surgery was initially scheduled for 04/11/20, but patient preferred to hold off until she had another follow-up at the [facility]. Since then seen at [facility] by Angelina Sheriff, NP. Right TMA site not healing. She was offered referral to [facility] for second opinion, but patient declined and to re-contact [facility].   History includes former smoker (quit 1986), HTN, chronic diastolic CHF (acute 04/6604 in setting of progression to ESRD), DM2, ESRD (s/p RUE loop AVGG 03/18/19), HLD, anemia, asthma ("past"), neuromuscular disorder (not specified), PAD (s/p angioplasty right AT artery 11/18/19; angioplasty left AT artery 02/23/20; s/p left transmetatarsal amputation 02/24/20), obesity.  Presurgical COVID-19 test is scheduled for 05/02/20. Anesthesia team to evaluate on the day of surgery.  Per [facility] instructions (see Letters tab), patient instructed to hold Plavix 5 days prior to surgery with last dose 04/28/20.  ADDENDUM 05/03/20 12:42 PM: Noted 05/02/20 COVID-19 test positive. [facility] staff aware and are attempting to contact patient to discuss and will need to follow-up with surgeon regarding urgency of procedure.   VS: LMP  (LMP Unknown)   Wt Readings from Last 3 Encounters:  04/06/20 106.6 kg  03/15/20 107 kg  02/25/20 105.4 kg   BP Readings from Last 3 Encounters:  04/06/20 139/76  03/15/20 (!) 160/70  03/09/20 (!) 152/75   Pulse Readings from Last 3 Encounters:  04/06/20 79  03/15/20 93  03/09/20 75    PROVIDERS: Claretta Fraise, MD is PCP    LABS: Currently, last lab results include: Lab Results  Component  Value Date   WBC 15.9 (H) 04/06/2020   HGB 9.6 (L) 04/06/2020   HCT 31.5 (L) 04/06/2020   PLT 701 (H) 04/06/2020   GLUCOSE 81 04/06/2020   ALT 7 04/06/2020   AST 5 04/06/2020   NA 136 04/06/2020   K 4.5 04/06/2020   CL 95 (L) 04/06/2020   CREATININE 5.87 (HH) 04/06/2020   BUN 17 04/06/2020   CO2 24 04/06/2020   HGBA1C 5.4 04/06/2020     EKG: 02/22/20: Sinus rhythm Atrial premature complexes Minimal ST depression, inferior leads no change from previous Confirmed by Charlesetta Shanks [phone]) on 02/23/2020 7:25:06 AM   CV: She had echo ~ 05/27/14 during admission to [facility]now [facility] for nephrotic syndrome, volume overload, acute respiratory failure requiring intubation and initiation of hemodialysis.   Past Medical History:  Diagnosis Date  . Anemia   . Arthritis   . Asthma    in the past  . CHF (congestive heart failure) ([place])   . Chronic kidney disease    Renal failure, dialysis on M/W/F  . 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 End stage renal disease ([place])   . End stage renal disease on dialysis ([place])   . Hemodialysis access, arteriovenous graft ([place])   . Hyperlipidemia   . Hypertension   . Neuromuscular disorder [facility])     Past Surgical History:  Procedure Laterality Date  . A/V FISTULAGRAM Right 02/04/2017   Procedure: A/V Fistulagram;  Surgeon: Waynetta Sandy, MD;  Location: [place] CV LAB;  Service: Cardiovascular;  Laterality: Right;  . ABDOMINAL AORTOGRAM W/LOWER EXTREMITY N/A 11/18/2019   Procedure: ABDOMINAL AORTOGRAM W/LOWER EXTREMITY;  Surgeon: [name], MD;  Location: [place] CV LAB;  Service: Cardiovascular;  Laterality: N/A;  . ABDOMINAL AORTOGRAM W/LOWER EXTREMITY N/A 02/23/2020   Procedure: ABDOMINAL AORTOGRAM W/LOWER EXTREMITY;  Surgeon: [name], MD;  Location: [place] CV LAB;  Service: Cardiovascular;  Laterality: N/A;  Bilateral  . AV FISTULA PLACEMENT Right  08/25/2014   Procedure: RIGHT BRACHIOCEPHALIC ARTERIOVENOUS (AV) FISTULA CREATION;  Surgeon: Mal Misty, MD;  Location: [facility] OR;  Service: Vascular;  Laterality: Right;  . AV FISTULA PLACEMENT Right 03/18/2019   Procedure: INSERTION OF ARTERIOVENOUS (AV) GORE-TEX GRAFT RIGHT ARM;  Surgeon: Serafina Mitchell, MD;  Location: [place];  Service: Vascular;  Laterality: Right;  . BASCILIC VEIN TRANSPOSITION Right 12/24/2018   Procedure: FIRST STAGE BASILIC VEIN TRANSPOSITION RIGHT UPPER ARM;  Surgeon: Serafina Mitchell, MD;  Location: [place];  Service: Vascular;  Laterality: Right;  . FISTULA SUPERFICIALIZATION Right 11/03/2014   Procedure: RIGHT UPPER ARM FISTULA SUPERFICIALIZATION;  Surgeon: Mal Misty, MD;  Location: [place];  Service: Vascular;  Laterality: Right;  . FISTULOGRAM Right 01/16/2016   Procedure: RIGHT UPPER EXTREMITY ARTERIOVENOUS FISTULOGRAM WITH BALLOON ANGIOPLASTY;  Surgeon: Vickie Epley, MD;  Location: [facility];  Service: Vascular;  Laterality: Right;  . INSERTION OF DIALYSIS CATHETER     X4  . IR FLUORO GUIDE CV LINE RIGHT  12/03/2018  . IR REMOVAL TUN CV CATH W/O FL  07/06/2019  . IR THROMBECTOMY AV FISTULA W/THROMBOLYSIS/PTA INC/SHUNT/IMG RIGHT Right 12/03/2018  . IR THROMBECTOMY AV FISTULA W/THROMBOLYSIS/PTA/STENT INC/SHUNT/IMG RT Right 10/11/2019  . IR US GUIDE VASC ACCESS RIGHT  12/03/2018  . IR US GUIDE VASC ACCESS RIGHT  12/03/2018  . IR US GUIDE VASC ACCESS RIGHT  10/14/2019  . LIGATION OF COMPETING BRANCHES OF ARTERIOVENOUS FISTULA Right 11/03/2014   Procedure: LIGATION OF COMPETING BRANCHE x1 OF RIGHT UPPER ARM ARTERIOVENOUS FISTULA;  Surgeon: Mal Misty, MD;  Location: [place];  Service: Vascular;  Laterality: Right;  . PERIPHERAL VASCULAR BALLOON ANGIOPLASTY Right 02/04/2017   Procedure: PERIPHERAL VASCULAR BALLOON ANGIOPLASTY;  Surgeon: Waynetta Sandy, MD;  Location: [place] CV LAB;  Service: Cardiovascular;  Laterality: Right;  . PERIPHERAL VASCULAR BALLOON  ANGIOPLASTY Right 11/18/2019   Procedure: PERIPHERAL VASCULAR BALLOON ANGIOPLASTY;  Surgeon: [name], MD;  Location: [place] CV LAB;  Service: Cardiovascular;  Laterality: Right;  anterior tibial  . PERIPHERAL VASCULAR BALLOON ANGIOPLASTY  02/23/2020   Procedure: PERIPHERAL VASCULAR BALLOON ANGIOPLASTY;  Surgeon: [name], MD;  Location: [place] CV LAB;  Service: Cardiovascular;;  Lt  AT  . TRANSMETATARSAL AMPUTATION Left 02/24/2020   Procedure: TRANSMETATARSAL AMPUTATION LEFT;  Surgeon: Cherre Robins, MD;  Location: [place];  Service: Vascular;  Laterality: Left;  . TUBAL LIGATION      MEDICATIONS: . sodium chloride flush (NS) 0.9 % injection 3 mL  . sodium chloride flush (NS) 0.9 % injection 3 mL   . acetaminophen (TYLENOL) 325 MG tablet  . amLODipine (NORVASC) 10 MG tablet  . atorvastatin (LIPITOR) 40 MG tablet  . clopidogrel (PLAVIX) 75 MG tablet  . collagenase (SANTYL) ointment  . diphenhydrAMINE (BENADRYL) 25 MG tablet  . Dulaglutide (TRULICITY) 1.5 GD/9.2EQ SOPN  . insulin glargine, 1 Unit Dial, (TOUJEO SOLOSTAR) 300 UNIT/ML Solostar Pen  . metoprolol succinate (TOPROL-XL) 100 MG 24 hr tablet  . multivitamin (RENA-VIT) TABS  tablet  . oxyCODONE-acetaminophen (PERCOCET) 5-325 MG tablet  . oxyCODONE-acetaminophen (PERCOCET) 5-325 MG tablet  . sevelamer carbonate (RENVELA) 800 MG tablet    Myra Gianotti, PA-C Surgical Short Stay/Anesthesiology [facility] Phone [phone] [facility] Phone [phone] 04/24/2020 5:41 PM

## 2020-04-26 DIAGNOSIS — D631 Anemia in chronic kidney disease: Secondary | ICD-10-CM | POA: Diagnosis not present

## 2020-04-26 DIAGNOSIS — N2581 Secondary hyperparathyroidism of renal origin: Secondary | ICD-10-CM | POA: Diagnosis not present

## 2020-04-26 DIAGNOSIS — Z992 Dependence on renal dialysis: Secondary | ICD-10-CM | POA: Diagnosis not present

## 2020-04-26 DIAGNOSIS — D509 Iron deficiency anemia, unspecified: Secondary | ICD-10-CM | POA: Diagnosis not present

## 2020-04-26 DIAGNOSIS — N186 End stage renal disease: Secondary | ICD-10-CM | POA: Diagnosis not present

## 2020-04-27 DIAGNOSIS — Z4801 Encounter for change or removal of surgical wound dressing: Secondary | ICD-10-CM | POA: Diagnosis not present

## 2020-04-27 DIAGNOSIS — Z89432 Acquired absence of left foot: Secondary | ICD-10-CM | POA: Diagnosis not present

## 2020-04-27 DIAGNOSIS — Z4781 Encounter for orthopedic aftercare following surgical amputation: Secondary | ICD-10-CM | POA: Diagnosis not present

## 2020-04-27 DIAGNOSIS — E1165 Type 2 diabetes mellitus with hyperglycemia: Secondary | ICD-10-CM | POA: Diagnosis not present

## 2020-04-27 DIAGNOSIS — I70203 Unspecified atherosclerosis of native arteries of extremities, bilateral legs: Secondary | ICD-10-CM | POA: Diagnosis not present

## 2020-04-27 DIAGNOSIS — E1151 Type 2 diabetes mellitus with diabetic peripheral angiopathy without gangrene: Secondary | ICD-10-CM | POA: Diagnosis not present

## 2020-04-28 DIAGNOSIS — T8189XD Other complications of procedures, not elsewhere classified, subsequent encounter: Secondary | ICD-10-CM | POA: Diagnosis not present

## 2020-04-28 DIAGNOSIS — Z7902 Long term (current) use of antithrombotics/antiplatelets: Secondary | ICD-10-CM | POA: Diagnosis not present

## 2020-04-28 DIAGNOSIS — M1990 Unspecified osteoarthritis, unspecified site: Secondary | ICD-10-CM | POA: Diagnosis not present

## 2020-04-28 DIAGNOSIS — T8789 Other complications of amputation stump: Secondary | ICD-10-CM | POA: Diagnosis not present

## 2020-04-28 DIAGNOSIS — Z7982 Long term (current) use of aspirin: Secondary | ICD-10-CM | POA: Diagnosis not present

## 2020-04-28 DIAGNOSIS — J45909 Unspecified asthma, uncomplicated: Secondary | ICD-10-CM | POA: Diagnosis not present

## 2020-04-28 DIAGNOSIS — Z992 Dependence on renal dialysis: Secondary | ICD-10-CM | POA: Diagnosis not present

## 2020-04-28 DIAGNOSIS — D509 Iron deficiency anemia, unspecified: Secondary | ICD-10-CM | POA: Diagnosis not present

## 2020-04-28 DIAGNOSIS — Z79891 Long term (current) use of opiate analgesic: Secondary | ICD-10-CM | POA: Diagnosis not present

## 2020-04-28 DIAGNOSIS — D631 Anemia in chronic kidney disease: Secondary | ICD-10-CM | POA: Diagnosis not present

## 2020-04-28 DIAGNOSIS — Z79899 Other long term (current) drug therapy: Secondary | ICD-10-CM | POA: Diagnosis not present

## 2020-04-28 DIAGNOSIS — I70203 Unspecified atherosclerosis of native arteries of extremities, bilateral legs: Secondary | ICD-10-CM | POA: Diagnosis not present

## 2020-04-28 DIAGNOSIS — E1151 Type 2 diabetes mellitus with diabetic peripheral angiopathy without gangrene: Secondary | ICD-10-CM | POA: Diagnosis not present

## 2020-04-28 DIAGNOSIS — E785 Hyperlipidemia, unspecified: Secondary | ICD-10-CM | POA: Diagnosis not present

## 2020-04-28 DIAGNOSIS — N2581 Secondary hyperparathyroidism of renal origin: Secondary | ICD-10-CM | POA: Diagnosis not present

## 2020-04-28 DIAGNOSIS — M869 Osteomyelitis, unspecified: Secondary | ICD-10-CM | POA: Diagnosis not present

## 2020-04-28 DIAGNOSIS — E1165 Type 2 diabetes mellitus with hyperglycemia: Secondary | ICD-10-CM | POA: Diagnosis not present

## 2020-04-28 DIAGNOSIS — G709 Myoneural disorder, unspecified: Secondary | ICD-10-CM | POA: Diagnosis not present

## 2020-04-28 DIAGNOSIS — E1122 Type 2 diabetes mellitus with diabetic chronic kidney disease: Secondary | ICD-10-CM | POA: Diagnosis not present

## 2020-04-28 DIAGNOSIS — Z794 Long term (current) use of insulin: Secondary | ICD-10-CM | POA: Diagnosis not present

## 2020-04-28 DIAGNOSIS — Z4800 Encounter for change or removal of nonsurgical wound dressing: Secondary | ICD-10-CM | POA: Diagnosis not present

## 2020-04-28 DIAGNOSIS — Z993 Dependence on wheelchair: Secondary | ICD-10-CM | POA: Diagnosis not present

## 2020-04-28 DIAGNOSIS — L89893 Pressure ulcer of other site, stage 3: Secondary | ICD-10-CM | POA: Diagnosis not present

## 2020-04-28 DIAGNOSIS — I132 Hypertensive heart and chronic kidney disease with heart failure and with stage 5 chronic kidney disease, or end stage renal disease: Secondary | ICD-10-CM | POA: Diagnosis not present

## 2020-04-28 DIAGNOSIS — Z87891 Personal history of nicotine dependence: Secondary | ICD-10-CM | POA: Diagnosis not present

## 2020-04-28 DIAGNOSIS — Z89432 Acquired absence of left foot: Secondary | ICD-10-CM | POA: Diagnosis not present

## 2020-04-28 DIAGNOSIS — N186 End stage renal disease: Secondary | ICD-10-CM | POA: Diagnosis not present

## 2020-04-28 DIAGNOSIS — E1152 Type 2 diabetes mellitus with diabetic peripheral angiopathy with gangrene: Secondary | ICD-10-CM | POA: Diagnosis not present

## 2020-04-28 DIAGNOSIS — I509 Heart failure, unspecified: Secondary | ICD-10-CM | POA: Diagnosis not present

## 2020-04-28 DIAGNOSIS — E1169 Type 2 diabetes mellitus with other specified complication: Secondary | ICD-10-CM | POA: Diagnosis not present

## 2020-04-28 DIAGNOSIS — Z4801 Encounter for change or removal of surgical wound dressing: Secondary | ICD-10-CM | POA: Diagnosis not present

## 2020-04-28 DIAGNOSIS — Z4781 Encounter for orthopedic aftercare following surgical amputation: Secondary | ICD-10-CM | POA: Diagnosis not present

## 2020-04-28 DIAGNOSIS — M859 Disorder of bone density and structure, unspecified: Secondary | ICD-10-CM | POA: Diagnosis not present

## 2020-05-01 DIAGNOSIS — E559 Vitamin D deficiency, unspecified: Secondary | ICD-10-CM | POA: Diagnosis not present

## 2020-05-01 DIAGNOSIS — D631 Anemia in chronic kidney disease: Secondary | ICD-10-CM | POA: Diagnosis not present

## 2020-05-01 DIAGNOSIS — N2581 Secondary hyperparathyroidism of renal origin: Secondary | ICD-10-CM | POA: Diagnosis not present

## 2020-05-01 DIAGNOSIS — D509 Iron deficiency anemia, unspecified: Secondary | ICD-10-CM | POA: Diagnosis not present

## 2020-05-01 DIAGNOSIS — Z992 Dependence on renal dialysis: Secondary | ICD-10-CM | POA: Diagnosis not present

## 2020-05-01 DIAGNOSIS — N186 End stage renal disease: Secondary | ICD-10-CM | POA: Diagnosis not present

## 2020-05-02 ENCOUNTER — Other Ambulatory Visit (HOSPITAL_COMMUNITY)
Admission: RE | Admit: 2020-05-02 | Discharge: 2020-05-02 | Disposition: A | Discharge status: Home / Self Care | Payer: Medicare Other | Source: Ambulatory Visit | Attending: Vascular Surgery | Admitting: Vascular Surgery

## 2020-05-02 DIAGNOSIS — U071 COVID-19: Secondary | ICD-10-CM | POA: Insufficient documentation

## 2020-05-02 DIAGNOSIS — Z4800 Encounter for change or removal of nonsurgical wound dressing: Secondary | ICD-10-CM | POA: Diagnosis not present

## 2020-05-02 DIAGNOSIS — Z89432 Acquired absence of left foot: Secondary | ICD-10-CM | POA: Diagnosis not present

## 2020-05-02 DIAGNOSIS — E1165 Type 2 diabetes mellitus with hyperglycemia: Secondary | ICD-10-CM | POA: Diagnosis not present

## 2020-05-02 DIAGNOSIS — Z01812 Encounter for preprocedural laboratory examination: Secondary | ICD-10-CM | POA: Insufficient documentation

## 2020-05-02 DIAGNOSIS — T8789 Other complications of amputation stump: Secondary | ICD-10-CM | POA: Diagnosis not present

## 2020-05-02 DIAGNOSIS — L89893 Pressure ulcer of other site, stage 3: Secondary | ICD-10-CM | POA: Diagnosis not present

## 2020-05-02 DIAGNOSIS — E1152 Type 2 diabetes mellitus with diabetic peripheral angiopathy with gangrene: Secondary | ICD-10-CM | POA: Diagnosis not present

## 2020-05-02 LAB — SARS CORONAVIRUS 2 (TAT 6-24 HRS): SARS Coronavirus 2: POSITIVE — AB

## 2020-05-03 ENCOUNTER — Telehealth: Payer: Self-pay

## 2020-05-03 DIAGNOSIS — D509 Iron deficiency anemia, unspecified: Secondary | ICD-10-CM | POA: Diagnosis not present

## 2020-05-03 DIAGNOSIS — N2581 Secondary hyperparathyroidism of renal origin: Secondary | ICD-10-CM | POA: Diagnosis not present

## 2020-05-03 DIAGNOSIS — D631 Anemia in chronic kidney disease: Secondary | ICD-10-CM | POA: Diagnosis not present

## 2020-05-03 DIAGNOSIS — N186 End stage renal disease: Secondary | ICD-10-CM | POA: Diagnosis not present

## 2020-05-03 DIAGNOSIS — Z992 Dependence on renal dialysis: Secondary | ICD-10-CM | POA: Diagnosis not present

## 2020-05-03 DIAGNOSIS — E559 Vitamin D deficiency, unspecified: Secondary | ICD-10-CM | POA: Diagnosis not present

## 2020-05-03 NOTE — Progress Notes (Addendum)
Patient tested positive for Covid-19 on 05/03/2019. Left VM on nurse's phone at [facility]  office.   Surgery date: 05/04/20 Surgeon: Dr. Stanford Breed  Update: @1003  Herma Ard, RN returned phone call, results relayed.

## 2020-05-03 NOTE — Telephone Encounter (Addendum)
Per Dr. Stanford Breed, recommend to delay pt for 10 days and can be rescheduled with any provider due to he may be off that week.   Spoke with pt's daughter [name] who provided pt's new cell number [phone]. Requested daughter to have pt contact our office if she speaks with her. Attempted to reach pt at new number provided. Lvm to return call.

## 2020-05-03 NOTE — Telephone Encounter (Signed)
Spoke with nurse, Ro at [facility] testing center site who advised pts results from yesterday were positive. 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 Stanford Breed on tomorrow.   Attempted to reach pt to assess symptoms. Unable to reach anyone or leave a message at either number listed.

## 2020-05-03 NOTE — Telephone Encounter (Signed)
Informed pt of covid positve results. No symptoms reported. Advised of Dr. Mora Appl reschedule recommendations. Pt scheduled for 05/16/20 with Dr. Scot Dock. Pt verbalized understanding to restart Plavix and to hold 5 days prior to surgery. She stated if this date doesn't work with transportation, she will contact office back for another date.

## 2020-05-06 DIAGNOSIS — Z992 Dependence on renal dialysis: Secondary | ICD-10-CM | POA: Diagnosis not present

## 2020-05-06 DIAGNOSIS — E559 Vitamin D deficiency, unspecified: Secondary | ICD-10-CM | POA: Diagnosis not present

## 2020-05-06 DIAGNOSIS — D509 Iron deficiency anemia, unspecified: Secondary | ICD-10-CM | POA: Diagnosis not present

## 2020-05-06 DIAGNOSIS — N186 End stage renal disease: Secondary | ICD-10-CM | POA: Diagnosis not present

## 2020-05-06 DIAGNOSIS — N2581 Secondary hyperparathyroidism of renal origin: Secondary | ICD-10-CM | POA: Diagnosis not present

## 2020-05-06 DIAGNOSIS — D631 Anemia in chronic kidney disease: Secondary | ICD-10-CM | POA: Diagnosis not present

## 2020-05-09 ENCOUNTER — Ambulatory Visit: Payer: Medicare Other | Admitting: Licensed Clinical Social Worker

## 2020-05-09 DIAGNOSIS — E559 Vitamin D deficiency, unspecified: Secondary | ICD-10-CM | POA: Diagnosis not present

## 2020-05-09 DIAGNOSIS — I151 Hypertension secondary to other renal disorders: Secondary | ICD-10-CM

## 2020-05-09 DIAGNOSIS — D509 Iron deficiency anemia, unspecified: Secondary | ICD-10-CM | POA: Diagnosis not present

## 2020-05-09 DIAGNOSIS — N186 End stage renal disease: Secondary | ICD-10-CM

## 2020-05-09 DIAGNOSIS — D631 Anemia in chronic kidney disease: Secondary | ICD-10-CM | POA: Diagnosis not present

## 2020-05-09 DIAGNOSIS — N2889 Other specified disorders of kidney and ureter: Secondary | ICD-10-CM

## 2020-05-09 DIAGNOSIS — J4530 Mild persistent asthma, uncomplicated: Secondary | ICD-10-CM

## 2020-05-09 DIAGNOSIS — Z992 Dependence on renal dialysis: Secondary | ICD-10-CM | POA: Diagnosis not present

## 2020-05-09 DIAGNOSIS — N2581 Secondary hyperparathyroidism of renal origin: Secondary | ICD-10-CM | POA: Diagnosis not present

## 2020-05-09 NOTE — Chronic Care Management (AMB) (Signed)
Chronic Care Management    Clinical Social Work Follow Up Note  05/09/2020 Name: Christy Zhang MRN: [id number] DOB: [birth year]  Christy Zhang is a 65 y.o. year old female who is a primary care patient of Christy Zhang, Christy Gash, MD. The CCM team was consulted for assistance with Christy Zhang .   Review of patient status, including review of consultants reports, other relevant assessments, and collaboration with appropriate care team members and the patient's provider was performed as part of comprehensive patient evaluation and provision of chronic care management services.    SDOH (Social Determinants of Health) assessments performed: No; risk for tobacco use; risk for depression; risk for stress; risk for physical inactivity  Flowsheet Row Chronic Care Management from 10/12/2019 in [place]  PHQ-9 Total Score 5      Outpatient Encounter Medications as of 05/09/2020  Medication Sig  . acetaminophen (TYLENOL) 325 MG tablet Take 2 tablets (650 mg total) by mouth every 6 (six) hours as needed for mild pain (or Fever >/= 101).  Marland Kitchen amLODipine (NORVASC) 10 MG tablet Take 1 tablet (10 mg total) by mouth at bedtime.  Marland Kitchen aspirin EC 81 MG tablet Take 81 mg by mouth at bedtime. Swallow whole.  Marland Kitchen atorvastatin (LIPITOR) 40 MG tablet Take 1 tablet (40 mg total) by mouth daily. (Patient taking differently: Take 40 mg by mouth at bedtime.)  . clopidogrel (PLAVIX) 75 MG tablet Take 1 tablet (75 mg total) by mouth daily.  . collagenase (SANTYL) ointment Apply 1 application topically daily.  . diphenhydrAMINE (BENADRYL) 25 MG tablet Take 25 mg by mouth every 6 (six) hours as needed for itching or sleep.  . Dulaglutide (TRULICITY) 1.5 HE/1.7EY SOPN Inject 1.5 mg into the skin once a week. Saturday  . insulin glargine, 1 Unit Dial, (TOUJEO SOLOSTAR) 300 UNIT/ML Solostar Pen Inject 100 Units into the skin in the morning.  . metoprolol succinate (TOPROL-XL) 100 MG 24 hr tablet Take  1 tablet (100 mg total) by mouth daily. TAKE ONE TABLET BY MOUTH DAILY. TAKE WITH OR IMMEDIATELY FOLLOWING A MEAL. (Patient taking differently: Take 100 mg by mouth daily. TAKE WITH OR IMMEDIATELY FOLLOWING A MEAL.)  . multivitamin (RENA-VIT) TABS tablet Take 1 tablet by mouth at bedtime.   Marland Kitchen oxyCODONE-acetaminophen (PERCOCET) 5-325 MG tablet Take 1 tablet by mouth every 8 (eight) hours as needed for up to 10 doses for severe pain. (Patient not taking: No sig reported)  . oxyCODONE-acetaminophen (PERCOCET) 5-325 MG tablet Take 1 tablet by mouth every 8 (eight) hours as needed for severe pain.  . sevelamer carbonate (RENVELA) 800 MG tablet Take 1,600-2,400 mg by mouth See admin instructions. Take 2400 mg with each meal and 1600 mg with each snack   Facility-Administered Encounter Medications as of 05/09/2020  Medication  . sodium chloride flush (NS) 0.9 % injection 3 mL  . sodium chloride flush (NS) 0.9 % injection 3 mL     Goals    .  Client will talk with LCSW in next 30 days about managing health issues of client and completion daily activities (pt-stated)      CARE PLAN ENTRY   Current Barriers:  . Diabetes management in client with chronic diagnoses of Asthma, HTN, ESRD, Anemia ,Obesity . Mobility issues (uses a walker to walk)  Clinical Social Work Clinical Goal(s):  Marland Kitchen LCSW will call client in next 30 days to discuss client management of health issues and client completion of daily activities  Interventions:  Talked with  client about her recent positive COVID 19 test result Talked with client about her current social work needs Talked with client about her family support (her daughter is supportive) Talked with client about her possible surgery on 05/16/2020 Talked with client about RNCM support with CCM program Talked with client about dialysis treatment for client (goes to dialysis 3 days weekly, goes to dialysis Monday, Wednesday and Friday) Talked with client about skin care  issues of client Talked with client about LCSW support with CCM program Talked with client about client completion of ADLs Talked with client about ambulation needs of client Talked with client about upcoming client appointments Talked with client about pain issues of client Talked with client about transport issues of client Talked with client about her use of wheelchair Talked with client about her use of specialized shoe on left foot Talked with client about sleeping issues of client Collaborated with RNCM regarding nursing needs of client   Patient Self Care Activities:   Attends scheduled medical appointments Eats meals independently Attends Dialysis treatments weekly as scheduled  Patient Self Care Deficits: . Diabetes management     Mobility issues  Initial goal documentation       Follow Up Plan: LCSW to call clientor daughter Christy Zhang next 4 weeks to talk about clientmanagement of health issues and to talk withclient or Christy Zhang about clientcompletion of daily activities  Christy Zhang.Christy Zhang MSW, LCSW Licensed Clinical Social Worker [facility] [phone]

## 2020-05-09 NOTE — Patient Instructions (Addendum)
Licensed Clinical Social Worker Visit Information  Goals we discussed today:   .  Client will talk with LCSW in next 30 days about managing health issues of client and completion daily activities (pt-stated)         CARE PLAN ENTRY   Current Barriers:   Diabetes management in client with chronic diagnoses of Asthma, HTN, ESRD, Anemia ,Obesity  Mobility issues (uses a walker to walk)  Clinical Social Work Clinical Goal(s):   LCSW will call client in next 30 days to discuss client management of health issues and client completion of daily activities  Interventions:  Talked with client about her recent positive COVID 19 test result Talked with client about her current social work needs Talked with client about her family support (her daughter is supportive) Talked with client about her possible surgery on 05/16/2020 Talked with client about RNCM support with CCM program Talked with client about dialysis treatment for client (goes to dialysis 3 days weekly, goes to dialysis Monday, Wednesday and Friday) Talked with client about skin care issues of client Talked with client about LCSW support with CCM program Talked with client about client completion of ADLs Talked with client about ambulation needs of client Talked with client about upcoming client appointments Talked with client about pain issues of client Talked with client about transport issues of client Talked with client about her use of wheelchair Talked with client about her use of specialized shoe on left foot Talked with client about sleeping issues of client Collaborated with RNCM regarding nursing needs of client   Patient Self Care Activities:   Attends scheduled medical appointments Eats meals independently Attends Dialysis treatments weekly as scheduled  Patient Self Care Deficits:  Diabetes management     Mobility issues  Initial goal documentation       Follow Up Plan:LCSW to call  [name] next 4 weeks to talk about clientmanagement of health issues and to talk withclient or [name] about clientcompletion of daily activities  Materials Provided: No  The patient verbalized understanding of instructions provided today and declined a print copy of patient instruction materials.   Norva Riffle.Kerrington Sova MSW, LCSW Licensed Clinical Social Worker [facility] [phone]

## 2020-05-10 ENCOUNTER — Telehealth: Payer: Self-pay

## 2020-05-10 NOTE — Telephone Encounter (Signed)
Attempted to reach pt, no answer.  Left detailed vm informing of change in arrival time to [facility] admitting of 0800 AM for surgery on 05/16/20. Requested a return call to verify information received.

## 2020-05-11 DIAGNOSIS — Z992 Dependence on renal dialysis: Secondary | ICD-10-CM | POA: Diagnosis not present

## 2020-05-11 DIAGNOSIS — D631 Anemia in chronic kidney disease: Secondary | ICD-10-CM | POA: Diagnosis not present

## 2020-05-11 DIAGNOSIS — N2581 Secondary hyperparathyroidism of renal origin: Secondary | ICD-10-CM | POA: Diagnosis not present

## 2020-05-11 DIAGNOSIS — E559 Vitamin D deficiency, unspecified: Secondary | ICD-10-CM | POA: Diagnosis not present

## 2020-05-11 DIAGNOSIS — D509 Iron deficiency anemia, unspecified: Secondary | ICD-10-CM | POA: Diagnosis not present

## 2020-05-11 DIAGNOSIS — N186 End stage renal disease: Secondary | ICD-10-CM | POA: Diagnosis not present

## 2020-05-11 NOTE — Telephone Encounter (Signed)
Spoke with pt's daughter/[name]. Informed her of change in arrival time to 0800 AM for surgery on 05/16/20. Reminded that a preadmission nurse will contact to review medications prior to surgery. Daughter/[name] verbalized understanding and stated she will relay information to pt.

## 2020-05-12 DIAGNOSIS — E559 Vitamin D deficiency, unspecified: Secondary | ICD-10-CM | POA: Diagnosis not present

## 2020-05-12 DIAGNOSIS — D509 Iron deficiency anemia, unspecified: Secondary | ICD-10-CM | POA: Diagnosis not present

## 2020-05-12 DIAGNOSIS — D631 Anemia in chronic kidney disease: Secondary | ICD-10-CM | POA: Diagnosis not present

## 2020-05-12 DIAGNOSIS — Z992 Dependence on renal dialysis: Secondary | ICD-10-CM | POA: Diagnosis not present

## 2020-05-12 DIAGNOSIS — N2581 Secondary hyperparathyroidism of renal origin: Secondary | ICD-10-CM | POA: Diagnosis not present

## 2020-05-12 DIAGNOSIS — N186 End stage renal disease: Secondary | ICD-10-CM | POA: Diagnosis not present

## 2020-05-13 DIAGNOSIS — Z89432 Acquired absence of left foot: Secondary | ICD-10-CM | POA: Diagnosis not present

## 2020-05-13 DIAGNOSIS — E1152 Type 2 diabetes mellitus with diabetic peripheral angiopathy with gangrene: Secondary | ICD-10-CM | POA: Diagnosis not present

## 2020-05-13 DIAGNOSIS — L89893 Pressure ulcer of other site, stage 3: Secondary | ICD-10-CM | POA: Diagnosis not present

## 2020-05-13 DIAGNOSIS — Z4800 Encounter for change or removal of nonsurgical wound dressing: Secondary | ICD-10-CM | POA: Diagnosis not present

## 2020-05-13 DIAGNOSIS — E1165 Type 2 diabetes mellitus with hyperglycemia: Secondary | ICD-10-CM | POA: Diagnosis not present

## 2020-05-13 DIAGNOSIS — T8789 Other complications of amputation stump: Secondary | ICD-10-CM | POA: Diagnosis not present

## 2020-05-15 ENCOUNTER — Other Ambulatory Visit: Payer: Self-pay | Admitting: Family Medicine

## 2020-05-15 ENCOUNTER — Telehealth: Payer: Self-pay | Admitting: *Deleted

## 2020-05-15 MED ORDER — TRESIBA 100 UNIT/ML ~~LOC~~ SOLN: 100.0000 [IU] | Freq: Every day | SUBCUTANEOUS | 5 refills | Status: DC

## 2020-05-15 NOTE — Telephone Encounter (Signed)
We have denied coverage or payment under your Medicare Part D benefit for the following prescription drug(s)  that you or your prescriber requested: TOUJEO SOLOSTAR Soln Pen-inj Why did we deny your request? We denied this request under Medicare Part D because: The requested drug is not on your plan's formulary  (list of covered drugs)  Must try Levemir Flextouch pen or vial, Tyler Aas Flextouch pen or vial,, or Agricultural engineer

## 2020-05-15 NOTE — Progress Notes (Signed)
Spoke with pt for pre-op call, she stated that her car is stuck in her driveway and she has a very long driveway and she doesn't think she can make it for her surgery in the morning. She said she called the office but they are closed. She states she left a message but has not heard back from them. I told her that I would call Dr. Scot Dock and let him know. I called Dr. Scot Dock and gave him the information and instructed me to cancel pt's surgery and have her call the office tomorrow to reschedule. I called and told pt that surgery will be cancelled for tomorrow and she needs to call the office tomorrow. She voiced understanding.  I called the OR desk and spoke with Alana to cancel the surgery.

## 2020-05-15 NOTE — Telephone Encounter (Signed)
PA in process Key: [id number] - PA Case ID: [id number] Need help? Call us at [phone] Status Sent to Plan today Drug Toujeo SoloStar 300UNIT/ML pen-injectors

## 2020-05-15 NOTE — Telephone Encounter (Signed)
I sent in a scrip for tresiba

## 2020-05-16 ENCOUNTER — Inpatient Hospital Stay (HOSPITAL_COMMUNITY): Admission: RE | Admit: 2020-05-16 | Payer: Medicare Other | Source: Home / Self Care | Admitting: Vascular Surgery

## 2020-05-16 ENCOUNTER — Encounter (HOSPITAL_COMMUNITY): Admission: RE | Payer: Self-pay | Source: Home / Self Care

## 2020-05-16 DIAGNOSIS — D509 Iron deficiency anemia, unspecified: Secondary | ICD-10-CM | POA: Diagnosis not present

## 2020-05-16 DIAGNOSIS — D631 Anemia in chronic kidney disease: Secondary | ICD-10-CM | POA: Diagnosis not present

## 2020-05-16 DIAGNOSIS — Z992 Dependence on renal dialysis: Secondary | ICD-10-CM | POA: Diagnosis not present

## 2020-05-16 DIAGNOSIS — N2581 Secondary hyperparathyroidism of renal origin: Secondary | ICD-10-CM | POA: Diagnosis not present

## 2020-05-16 DIAGNOSIS — N186 End stage renal disease: Secondary | ICD-10-CM | POA: Diagnosis not present

## 2020-05-16 DIAGNOSIS — E559 Vitamin D deficiency, unspecified: Secondary | ICD-10-CM | POA: Diagnosis not present

## 2020-05-16 SURGERY — AMPUTATION BELOW KNEE
Anesthesia: General | Site: Knee | Laterality: Right

## 2020-05-16 NOTE — Telephone Encounter (Signed)
No answer, mailbox full

## 2020-05-17 ENCOUNTER — Telehealth: Payer: Self-pay

## 2020-05-17 DIAGNOSIS — E559 Vitamin D deficiency, unspecified: Secondary | ICD-10-CM | POA: Diagnosis not present

## 2020-05-17 DIAGNOSIS — D509 Iron deficiency anemia, unspecified: Secondary | ICD-10-CM | POA: Diagnosis not present

## 2020-05-17 DIAGNOSIS — Z992 Dependence on renal dialysis: Secondary | ICD-10-CM | POA: Diagnosis not present

## 2020-05-17 DIAGNOSIS — N2581 Secondary hyperparathyroidism of renal origin: Secondary | ICD-10-CM | POA: Diagnosis not present

## 2020-05-17 DIAGNOSIS — D631 Anemia in chronic kidney disease: Secondary | ICD-10-CM | POA: Diagnosis not present

## 2020-05-17 DIAGNOSIS — N186 End stage renal disease: Secondary | ICD-10-CM | POA: Diagnosis not present

## 2020-05-17 NOTE — Telephone Encounter (Signed)
Pt called to r/s right AKA vs BKA surgery appt that was missed on 05/16/20 due to inclement weather. Offered pt multiple dates, but pt postponed until 06/08/20. Instructions reviewed and updated patient information letter sent to pt via MyChart. Pt verbalized understanding.

## 2020-05-19 DIAGNOSIS — D509 Iron deficiency anemia, unspecified: Secondary | ICD-10-CM | POA: Diagnosis not present

## 2020-05-19 DIAGNOSIS — D631 Anemia in chronic kidney disease: Secondary | ICD-10-CM | POA: Diagnosis not present

## 2020-05-19 DIAGNOSIS — Z992 Dependence on renal dialysis: Secondary | ICD-10-CM | POA: Diagnosis not present

## 2020-05-19 DIAGNOSIS — E559 Vitamin D deficiency, unspecified: Secondary | ICD-10-CM | POA: Diagnosis not present

## 2020-05-19 DIAGNOSIS — N2581 Secondary hyperparathyroidism of renal origin: Secondary | ICD-10-CM | POA: Diagnosis not present

## 2020-05-19 DIAGNOSIS — N186 End stage renal disease: Secondary | ICD-10-CM | POA: Diagnosis not present

## 2020-05-22 ENCOUNTER — Ambulatory Visit (INDEPENDENT_AMBULATORY_CARE_PROVIDER_SITE_OTHER): Payer: Medicare Other

## 2020-05-22 ENCOUNTER — Other Ambulatory Visit: Payer: Self-pay

## 2020-05-22 DIAGNOSIS — N186 End stage renal disease: Secondary | ICD-10-CM

## 2020-05-22 DIAGNOSIS — E1122 Type 2 diabetes mellitus with diabetic chronic kidney disease: Secondary | ICD-10-CM

## 2020-05-22 DIAGNOSIS — E1152 Type 2 diabetes mellitus with diabetic peripheral angiopathy with gangrene: Secondary | ICD-10-CM | POA: Diagnosis not present

## 2020-05-22 DIAGNOSIS — E785 Hyperlipidemia, unspecified: Secondary | ICD-10-CM

## 2020-05-22 DIAGNOSIS — T8789 Other complications of amputation stump: Secondary | ICD-10-CM | POA: Diagnosis not present

## 2020-05-22 DIAGNOSIS — Z4800 Encounter for change or removal of nonsurgical wound dressing: Secondary | ICD-10-CM | POA: Diagnosis not present

## 2020-05-22 DIAGNOSIS — D631 Anemia in chronic kidney disease: Secondary | ICD-10-CM | POA: Diagnosis not present

## 2020-05-22 DIAGNOSIS — N2581 Secondary hyperparathyroidism of renal origin: Secondary | ICD-10-CM | POA: Diagnosis not present

## 2020-05-22 DIAGNOSIS — E1165 Type 2 diabetes mellitus with hyperglycemia: Secondary | ICD-10-CM

## 2020-05-22 DIAGNOSIS — Z992 Dependence on renal dialysis: Secondary | ICD-10-CM | POA: Diagnosis not present

## 2020-05-22 DIAGNOSIS — E559 Vitamin D deficiency, unspecified: Secondary | ICD-10-CM | POA: Diagnosis not present

## 2020-05-22 DIAGNOSIS — M859 Disorder of bone density and structure, unspecified: Secondary | ICD-10-CM

## 2020-05-22 DIAGNOSIS — L89893 Pressure ulcer of other site, stage 3: Secondary | ICD-10-CM

## 2020-05-22 DIAGNOSIS — Z7902 Long term (current) use of antithrombotics/antiplatelets: Secondary | ICD-10-CM

## 2020-05-22 DIAGNOSIS — M1990 Unspecified osteoarthritis, unspecified site: Secondary | ICD-10-CM

## 2020-05-22 DIAGNOSIS — I509 Heart failure, unspecified: Secondary | ICD-10-CM | POA: Diagnosis not present

## 2020-05-22 DIAGNOSIS — Z89432 Acquired absence of left foot: Secondary | ICD-10-CM | POA: Diagnosis not present

## 2020-05-22 DIAGNOSIS — I70203 Unspecified atherosclerosis of native arteries of extremities, bilateral legs: Secondary | ICD-10-CM | POA: Diagnosis not present

## 2020-05-22 DIAGNOSIS — M869 Osteomyelitis, unspecified: Secondary | ICD-10-CM

## 2020-05-22 DIAGNOSIS — E1169 Type 2 diabetes mellitus with other specified complication: Secondary | ICD-10-CM

## 2020-05-22 DIAGNOSIS — I132 Hypertensive heart and chronic kidney disease with heart failure and with stage 5 chronic kidney disease, or end stage renal disease: Secondary | ICD-10-CM

## 2020-05-22 DIAGNOSIS — Z794 Long term (current) use of insulin: Secondary | ICD-10-CM

## 2020-05-22 DIAGNOSIS — J45909 Unspecified asthma, uncomplicated: Secondary | ICD-10-CM

## 2020-05-22 DIAGNOSIS — D509 Iron deficiency anemia, unspecified: Secondary | ICD-10-CM | POA: Diagnosis not present

## 2020-05-22 DIAGNOSIS — Z79891 Long term (current) use of opiate analgesic: Secondary | ICD-10-CM

## 2020-05-24 DIAGNOSIS — Z992 Dependence on renal dialysis: Secondary | ICD-10-CM | POA: Diagnosis not present

## 2020-05-24 DIAGNOSIS — D509 Iron deficiency anemia, unspecified: Secondary | ICD-10-CM | POA: Diagnosis not present

## 2020-05-24 DIAGNOSIS — N186 End stage renal disease: Secondary | ICD-10-CM | POA: Diagnosis not present

## 2020-05-24 DIAGNOSIS — N2581 Secondary hyperparathyroidism of renal origin: Secondary | ICD-10-CM | POA: Diagnosis not present

## 2020-05-24 DIAGNOSIS — E559 Vitamin D deficiency, unspecified: Secondary | ICD-10-CM | POA: Diagnosis not present

## 2020-05-24 DIAGNOSIS — D631 Anemia in chronic kidney disease: Secondary | ICD-10-CM | POA: Diagnosis not present

## 2020-05-25 ENCOUNTER — Telehealth: Payer: Medicare Other | Admitting: *Deleted

## 2020-05-25 ENCOUNTER — Telehealth: Payer: Self-pay | Admitting: *Deleted

## 2020-05-25 DIAGNOSIS — E1165 Type 2 diabetes mellitus with hyperglycemia: Secondary | ICD-10-CM | POA: Diagnosis not present

## 2020-05-25 DIAGNOSIS — L89893 Pressure ulcer of other site, stage 3: Secondary | ICD-10-CM | POA: Diagnosis not present

## 2020-05-25 DIAGNOSIS — T8789 Other complications of amputation stump: Secondary | ICD-10-CM | POA: Diagnosis not present

## 2020-05-25 DIAGNOSIS — E1152 Type 2 diabetes mellitus with diabetic peripheral angiopathy with gangrene: Secondary | ICD-10-CM | POA: Diagnosis not present

## 2020-05-25 DIAGNOSIS — Z4800 Encounter for change or removal of nonsurgical wound dressing: Secondary | ICD-10-CM | POA: Diagnosis not present

## 2020-05-25 DIAGNOSIS — Z89432 Acquired absence of left foot: Secondary | ICD-10-CM | POA: Diagnosis not present

## 2020-05-25 NOTE — Telephone Encounter (Signed)
  Chronic Care Management   Outreach Note  05/25/2020 Name: Christy Zhang MRN: [id number] DOB: 1956-01-13  Referred by: Claretta Fraise, MD Reason for referral : Chronic Care Management (RN Initial Visit)   An unsuccessful Initial RN Telephone Visit was attempted today. The patient was referred to the case management team for assistance with care management and care coordination. Patient is established with LCSW.  Clinical Goals: . Over the next 30 days, patient will be contacted by a Care Guide to reschedule their CCM Visit  Interventions and Plan . Chart reviewed in preparation for telephone visit . Collaboration with other care team members as needed . Request sent to care guides to reach out and reschedule patient's telephone visit   Chong Sicilian, BSN, RN-BC [place] [facility] [place] Management Direct Dial: [phone]

## 2020-05-26 DIAGNOSIS — E559 Vitamin D deficiency, unspecified: Secondary | ICD-10-CM | POA: Diagnosis not present

## 2020-05-26 DIAGNOSIS — N2581 Secondary hyperparathyroidism of renal origin: Secondary | ICD-10-CM | POA: Diagnosis not present

## 2020-05-26 DIAGNOSIS — N186 End stage renal disease: Secondary | ICD-10-CM | POA: Diagnosis not present

## 2020-05-26 DIAGNOSIS — D631 Anemia in chronic kidney disease: Secondary | ICD-10-CM | POA: Diagnosis not present

## 2020-05-26 DIAGNOSIS — Z992 Dependence on renal dialysis: Secondary | ICD-10-CM | POA: Diagnosis not present

## 2020-05-26 DIAGNOSIS — D509 Iron deficiency anemia, unspecified: Secondary | ICD-10-CM | POA: Diagnosis not present

## 2020-05-28 DIAGNOSIS — Z992 Dependence on renal dialysis: Secondary | ICD-10-CM | POA: Diagnosis not present

## 2020-05-28 DIAGNOSIS — L89893 Pressure ulcer of other site, stage 3: Secondary | ICD-10-CM | POA: Diagnosis not present

## 2020-05-28 DIAGNOSIS — I509 Heart failure, unspecified: Secondary | ICD-10-CM | POA: Diagnosis not present

## 2020-05-28 DIAGNOSIS — Z89432 Acquired absence of left foot: Secondary | ICD-10-CM | POA: Diagnosis not present

## 2020-05-28 DIAGNOSIS — Z4800 Encounter for change or removal of nonsurgical wound dressing: Secondary | ICD-10-CM | POA: Diagnosis not present

## 2020-05-28 DIAGNOSIS — Z794 Long term (current) use of insulin: Secondary | ICD-10-CM | POA: Diagnosis not present

## 2020-05-28 DIAGNOSIS — E1165 Type 2 diabetes mellitus with hyperglycemia: Secondary | ICD-10-CM | POA: Diagnosis not present

## 2020-05-28 DIAGNOSIS — Z993 Dependence on wheelchair: Secondary | ICD-10-CM | POA: Diagnosis not present

## 2020-05-28 DIAGNOSIS — M859 Disorder of bone density and structure, unspecified: Secondary | ICD-10-CM | POA: Diagnosis not present

## 2020-05-28 DIAGNOSIS — T8789 Other complications of amputation stump: Secondary | ICD-10-CM | POA: Diagnosis not present

## 2020-05-28 DIAGNOSIS — M1990 Unspecified osteoarthritis, unspecified site: Secondary | ICD-10-CM | POA: Diagnosis not present

## 2020-05-28 DIAGNOSIS — E785 Hyperlipidemia, unspecified: Secondary | ICD-10-CM | POA: Diagnosis not present

## 2020-05-28 DIAGNOSIS — N186 End stage renal disease: Secondary | ICD-10-CM | POA: Diagnosis not present

## 2020-05-28 DIAGNOSIS — Z7902 Long term (current) use of antithrombotics/antiplatelets: Secondary | ICD-10-CM | POA: Diagnosis not present

## 2020-05-28 DIAGNOSIS — Z79891 Long term (current) use of opiate analgesic: Secondary | ICD-10-CM | POA: Diagnosis not present

## 2020-05-28 DIAGNOSIS — I70203 Unspecified atherosclerosis of native arteries of extremities, bilateral legs: Secondary | ICD-10-CM | POA: Diagnosis not present

## 2020-05-28 DIAGNOSIS — M869 Osteomyelitis, unspecified: Secondary | ICD-10-CM | POA: Diagnosis not present

## 2020-05-28 DIAGNOSIS — I132 Hypertensive heart and chronic kidney disease with heart failure and with stage 5 chronic kidney disease, or end stage renal disease: Secondary | ICD-10-CM | POA: Diagnosis not present

## 2020-05-28 DIAGNOSIS — D631 Anemia in chronic kidney disease: Secondary | ICD-10-CM | POA: Diagnosis not present

## 2020-05-28 DIAGNOSIS — E1152 Type 2 diabetes mellitus with diabetic peripheral angiopathy with gangrene: Secondary | ICD-10-CM | POA: Diagnosis not present

## 2020-05-28 DIAGNOSIS — J45909 Unspecified asthma, uncomplicated: Secondary | ICD-10-CM | POA: Diagnosis not present

## 2020-05-28 DIAGNOSIS — E1169 Type 2 diabetes mellitus with other specified complication: Secondary | ICD-10-CM | POA: Diagnosis not present

## 2020-05-28 DIAGNOSIS — E1122 Type 2 diabetes mellitus with diabetic chronic kidney disease: Secondary | ICD-10-CM | POA: Diagnosis not present

## 2020-05-28 DIAGNOSIS — Z7982 Long term (current) use of aspirin: Secondary | ICD-10-CM | POA: Diagnosis not present

## 2020-05-28 DIAGNOSIS — Z79899 Other long term (current) drug therapy: Secondary | ICD-10-CM | POA: Diagnosis not present

## 2020-05-29 DIAGNOSIS — Z992 Dependence on renal dialysis: Secondary | ICD-10-CM | POA: Diagnosis not present

## 2020-05-29 DIAGNOSIS — D509 Iron deficiency anemia, unspecified: Secondary | ICD-10-CM | POA: Diagnosis not present

## 2020-05-29 DIAGNOSIS — N2581 Secondary hyperparathyroidism of renal origin: Secondary | ICD-10-CM | POA: Diagnosis not present

## 2020-05-29 DIAGNOSIS — D631 Anemia in chronic kidney disease: Secondary | ICD-10-CM | POA: Diagnosis not present

## 2020-05-29 DIAGNOSIS — N186 End stage renal disease: Secondary | ICD-10-CM | POA: Diagnosis not present

## 2020-05-29 DIAGNOSIS — E559 Vitamin D deficiency, unspecified: Secondary | ICD-10-CM | POA: Diagnosis not present

## 2020-05-29 NOTE — Telephone Encounter (Signed)
Please reschedule

## 2020-05-29 NOTE — Telephone Encounter (Signed)
Rescheduled for 06/20/2020

## 2020-05-31 DIAGNOSIS — N186 End stage renal disease: Secondary | ICD-10-CM | POA: Diagnosis not present

## 2020-05-31 DIAGNOSIS — D509 Iron deficiency anemia, unspecified: Secondary | ICD-10-CM | POA: Diagnosis not present

## 2020-05-31 DIAGNOSIS — Z992 Dependence on renal dialysis: Secondary | ICD-10-CM | POA: Diagnosis not present

## 2020-05-31 DIAGNOSIS — N2581 Secondary hyperparathyroidism of renal origin: Secondary | ICD-10-CM | POA: Diagnosis not present

## 2020-05-31 DIAGNOSIS — D631 Anemia in chronic kidney disease: Secondary | ICD-10-CM | POA: Diagnosis not present

## 2020-06-02 DIAGNOSIS — N2581 Secondary hyperparathyroidism of renal origin: Secondary | ICD-10-CM | POA: Diagnosis not present

## 2020-06-02 DIAGNOSIS — N186 End stage renal disease: Secondary | ICD-10-CM | POA: Diagnosis not present

## 2020-06-02 DIAGNOSIS — D631 Anemia in chronic kidney disease: Secondary | ICD-10-CM | POA: Diagnosis not present

## 2020-06-02 DIAGNOSIS — Z992 Dependence on renal dialysis: Secondary | ICD-10-CM | POA: Diagnosis not present

## 2020-06-02 DIAGNOSIS — D509 Iron deficiency anemia, unspecified: Secondary | ICD-10-CM | POA: Diagnosis not present

## 2020-06-02 NOTE — Telephone Encounter (Signed)
Called na vm full

## 2020-06-03 DIAGNOSIS — E1152 Type 2 diabetes mellitus with diabetic peripheral angiopathy with gangrene: Secondary | ICD-10-CM | POA: Diagnosis not present

## 2020-06-03 DIAGNOSIS — Z89432 Acquired absence of left foot: Secondary | ICD-10-CM | POA: Diagnosis not present

## 2020-06-03 DIAGNOSIS — L89893 Pressure ulcer of other site, stage 3: Secondary | ICD-10-CM | POA: Diagnosis not present

## 2020-06-03 DIAGNOSIS — Z4800 Encounter for change or removal of nonsurgical wound dressing: Secondary | ICD-10-CM | POA: Diagnosis not present

## 2020-06-03 DIAGNOSIS — E1165 Type 2 diabetes mellitus with hyperglycemia: Secondary | ICD-10-CM | POA: Diagnosis not present

## 2020-06-03 DIAGNOSIS — T8789 Other complications of amputation stump: Secondary | ICD-10-CM | POA: Diagnosis not present

## 2020-06-05 DIAGNOSIS — N186 End stage renal disease: Secondary | ICD-10-CM | POA: Diagnosis not present

## 2020-06-05 DIAGNOSIS — D631 Anemia in chronic kidney disease: Secondary | ICD-10-CM | POA: Diagnosis not present

## 2020-06-05 DIAGNOSIS — N2581 Secondary hyperparathyroidism of renal origin: Secondary | ICD-10-CM | POA: Diagnosis not present

## 2020-06-05 DIAGNOSIS — D509 Iron deficiency anemia, unspecified: Secondary | ICD-10-CM | POA: Diagnosis not present

## 2020-06-05 DIAGNOSIS — Z992 Dependence on renal dialysis: Secondary | ICD-10-CM | POA: Diagnosis not present

## 2020-06-06 ENCOUNTER — Encounter (HOSPITAL_COMMUNITY): Payer: Self-pay

## 2020-06-06 NOTE — Anesthesia Preprocedure Evaluation (Addendum)
Anesthesia Evaluation  Patient identified by MRN, date of birth, ID band Patient awake    Reviewed: Allergy & Precautions, NPO status , Patient's Chart, lab work & pertinent test results, reviewed documented beta blocker date and time   History of Anesthesia Complications Negative for: history of anesthetic complications  Airway Mallampati: I  TM Distance: >3 FB Neck ROM: Full    Dental  (+) Edentulous Upper, Poor Dentition, Missing, Dental Advisory Given   Pulmonary COPD, former smoker,  05/02/2020 COVID POSITIVE   breath sounds clear to auscultation       Cardiovascular hypertension, Pt. on medications and Pt. on home beta blockers (-) angina+ Peripheral Vascular Disease   Rhythm:Regular Rate:Normal     Neuro/Psych Diabetic peripheral neuropathy    GI/Hepatic negative GI ROS, Neg liver ROS,   Endo/Other  diabetes (glu 90), Insulin DependentMorbid obesity  Renal/GU Dialysis and ESRFRenal disease (MWF)     Musculoskeletal  (+) Arthritis ,   Abdominal (+) + obese,   Peds  Hematology Plavix   Anesthesia Other Findings   Reproductive/Obstetrics                           Anesthesia Physical Anesthesia Plan  ASA: III  Anesthesia Plan: General   Post-op Pain Management:    Induction: Intravenous  PONV Risk Score and Plan: 3 and Ondansetron, Dexamethasone and Scopolamine patch - Pre-op  Airway Management Planned: LMA  Additional Equipment: None  Intra-op Plan:   Post-operative Plan:   Informed Consent: I have reviewed the patients History and Physical, chart, labs and discussed the procedure including the risks, benefits and alternatives for the proposed anesthesia with the patient or authorized representative who has indicated his/her understanding and acceptance.     Dental advisory given  Plan Discussed with: CRNA and Surgeon  Anesthesia Plan Comments: (PAT note written  06/06/2020 by Myra Gianotti, PA-C.)      Anesthesia Quick Evaluation

## 2020-06-06 NOTE — Progress Notes (Signed)
PCP:  Claretta Fraise, MD Cardiologist:  Monica Martinez, MD  EKG:  02/22/20 CXR:  08/25/14 ECHO:  denies Stress Test: denies Cardiac Cath: denies  Fasting Blood Sugar-  95-200 Checks Blood Sugar_2__ times a day  OSA/CPAP:  No  ASA:  Continue  Plavix:  Last dose 06/02/20  Covid test positive 05/02/20. No need to retest.  Anesthesia Review:  Yes, history CHF.   Patient denies shortness of breath, fever, cough, and chest pain at PAT appointment.  Patient verbalized understanding of instructions provided today at the PAT appointment.  Patient asked to review instructions at home and day of surgery.

## 2020-06-06 NOTE — Progress Notes (Signed)
Anesthesia Follow-Up:  See my previous notes, last from Encounter 05/16/20. Patient had been scheduled for right BKA versus AKA with Dr. Stanford Breed on 05/04/20, but her 05/02/20 COVID-19 test was positive. Surgery was rescheduled for 05/16/20, but she could not get to the hospital due to inclement weather. Surgery now rescheduled 06/08/20.   She is a same day work-up, so she will get labs and anesthesia team evaluation on the day of surgery.   Myra Gianotti, PA-C Surgical Short Stay/Anesthesiology [facility] Phone [phone] [facility] Phone [phone] 06/06/2020 6:20 PM

## 2020-06-07 ENCOUNTER — Other Ambulatory Visit: Payer: Self-pay

## 2020-06-07 ENCOUNTER — Telehealth: Payer: Medicare Other

## 2020-06-07 DIAGNOSIS — N186 End stage renal disease: Secondary | ICD-10-CM | POA: Diagnosis not present

## 2020-06-07 DIAGNOSIS — D631 Anemia in chronic kidney disease: Secondary | ICD-10-CM | POA: Diagnosis not present

## 2020-06-07 DIAGNOSIS — N2581 Secondary hyperparathyroidism of renal origin: Secondary | ICD-10-CM | POA: Diagnosis not present

## 2020-06-07 DIAGNOSIS — D509 Iron deficiency anemia, unspecified: Secondary | ICD-10-CM | POA: Diagnosis not present

## 2020-06-07 DIAGNOSIS — Z992 Dependence on renal dialysis: Secondary | ICD-10-CM | POA: Diagnosis not present

## 2020-06-08 ENCOUNTER — Encounter (HOSPITAL_COMMUNITY): Payer: Self-pay

## 2020-06-08 ENCOUNTER — Inpatient Hospital Stay (HOSPITAL_COMMUNITY): Payer: Medicare Other | Admitting: Vascular Surgery

## 2020-06-08 ENCOUNTER — Inpatient Hospital Stay (HOSPITAL_COMMUNITY)
Admission: RE | Admit: 2020-06-08 | Discharge: 2020-06-16 | DRG: 474 | Disposition: A | Discharge status: Rehabilitation Facility | Payer: Medicare Other | Attending: Internal Medicine | Admitting: Internal Medicine

## 2020-06-08 ENCOUNTER — Encounter (HOSPITAL_COMMUNITY)
Admission: RE | Disposition: A | Discharge status: Rehabilitation Facility | Payer: Self-pay | Source: Home / Self Care | Attending: Family Medicine

## 2020-06-08 ENCOUNTER — Other Ambulatory Visit: Payer: Self-pay

## 2020-06-08 DIAGNOSIS — L97929 Non-pressure chronic ulcer of unspecified part of left lower leg with unspecified severity: Secondary | ICD-10-CM | POA: Diagnosis not present

## 2020-06-08 DIAGNOSIS — Z833 Family history of diabetes mellitus: Secondary | ICD-10-CM

## 2020-06-08 DIAGNOSIS — I10 Essential (primary) hypertension: Secondary | ICD-10-CM | POA: Diagnosis not present

## 2020-06-08 DIAGNOSIS — Z8249 Family history of ischemic heart disease and other diseases of the circulatory system: Secondary | ICD-10-CM

## 2020-06-08 DIAGNOSIS — E1169 Type 2 diabetes mellitus with other specified complication: Secondary | ICD-10-CM | POA: Diagnosis not present

## 2020-06-08 DIAGNOSIS — I120 Hypertensive chronic kidney disease with stage 5 chronic kidney disease or end stage renal disease: Secondary | ICD-10-CM | POA: Diagnosis present

## 2020-06-08 DIAGNOSIS — N186 End stage renal disease: Secondary | ICD-10-CM | POA: Diagnosis present

## 2020-06-08 DIAGNOSIS — Z89511 Acquired absence of right leg below knee: Secondary | ICD-10-CM

## 2020-06-08 DIAGNOSIS — N250 Renal osteodystrophy: Secondary | ICD-10-CM | POA: Diagnosis not present

## 2020-06-08 DIAGNOSIS — Z87891 Personal history of nicotine dependence: Secondary | ICD-10-CM | POA: Diagnosis not present

## 2020-06-08 DIAGNOSIS — Z89512 Acquired absence of left leg below knee: Secondary | ICD-10-CM

## 2020-06-08 DIAGNOSIS — E1152 Type 2 diabetes mellitus with diabetic peripheral angiopathy with gangrene: Secondary | ICD-10-CM | POA: Diagnosis present

## 2020-06-08 DIAGNOSIS — E8809 Other disorders of plasma-protein metabolism, not elsewhere classified: Secondary | ICD-10-CM | POA: Diagnosis present

## 2020-06-08 DIAGNOSIS — Z6841 Body Mass Index (BMI) 40.0 and over, adult: Secondary | ICD-10-CM

## 2020-06-08 DIAGNOSIS — Z7982 Long term (current) use of aspirin: Secondary | ICD-10-CM | POA: Diagnosis not present

## 2020-06-08 DIAGNOSIS — T8789 Other complications of amputation stump: Secondary | ICD-10-CM | POA: Diagnosis present

## 2020-06-08 DIAGNOSIS — I739 Peripheral vascular disease, unspecified: Secondary | ICD-10-CM | POA: Diagnosis not present

## 2020-06-08 DIAGNOSIS — Z992 Dependence on renal dialysis: Secondary | ICD-10-CM | POA: Diagnosis not present

## 2020-06-08 DIAGNOSIS — Z882 Allergy status to sulfonamides status: Secondary | ICD-10-CM

## 2020-06-08 DIAGNOSIS — Y835 Amputation of limb(s) as the cause of abnormal reaction of the patient, or of later complication, without mention of misadventure at the time of the procedure: Secondary | ICD-10-CM | POA: Diagnosis present

## 2020-06-08 DIAGNOSIS — N2889 Other specified disorders of kidney and ureter: Secondary | ICD-10-CM | POA: Diagnosis not present

## 2020-06-08 DIAGNOSIS — Z89519 Acquired absence of unspecified leg below knee: Secondary | ICD-10-CM

## 2020-06-08 DIAGNOSIS — Z4781 Encounter for orthopedic aftercare following surgical amputation: Secondary | ICD-10-CM | POA: Diagnosis not present

## 2020-06-08 DIAGNOSIS — Z8616 Personal history of COVID-19: Secondary | ICD-10-CM

## 2020-06-08 DIAGNOSIS — D631 Anemia in chronic kidney disease: Secondary | ICD-10-CM | POA: Diagnosis present

## 2020-06-08 DIAGNOSIS — E669 Obesity, unspecified: Secondary | ICD-10-CM | POA: Diagnosis not present

## 2020-06-08 DIAGNOSIS — J4530 Mild persistent asthma, uncomplicated: Secondary | ICD-10-CM | POA: Diagnosis not present

## 2020-06-08 DIAGNOSIS — G8918 Other acute postprocedural pain: Secondary | ICD-10-CM | POA: Diagnosis not present

## 2020-06-08 DIAGNOSIS — Z794 Long term (current) use of insulin: Secondary | ICD-10-CM

## 2020-06-08 DIAGNOSIS — R001 Bradycardia, unspecified: Secondary | ICD-10-CM | POA: Diagnosis not present

## 2020-06-08 DIAGNOSIS — E1122 Type 2 diabetes mellitus with diabetic chronic kidney disease: Secondary | ICD-10-CM | POA: Diagnosis present

## 2020-06-08 DIAGNOSIS — I70262 Atherosclerosis of native arteries of extremities with gangrene, left leg: Secondary | ICD-10-CM | POA: Diagnosis not present

## 2020-06-08 DIAGNOSIS — L089 Local infection of the skin and subcutaneous tissue, unspecified: Secondary | ICD-10-CM | POA: Diagnosis not present

## 2020-06-08 DIAGNOSIS — S88112A Complete traumatic amputation at level between knee and ankle, left lower leg, initial encounter: Secondary | ICD-10-CM | POA: Diagnosis not present

## 2020-06-08 DIAGNOSIS — I151 Hypertension secondary to other renal disorders: Secondary | ICD-10-CM | POA: Diagnosis not present

## 2020-06-08 DIAGNOSIS — I96 Gangrene, not elsewhere classified: Secondary | ICD-10-CM | POA: Diagnosis not present

## 2020-06-08 DIAGNOSIS — I70248 Atherosclerosis of native arteries of left leg with ulceration of other part of lower left leg: Secondary | ICD-10-CM | POA: Diagnosis not present

## 2020-06-08 DIAGNOSIS — E785 Hyperlipidemia, unspecified: Secondary | ICD-10-CM | POA: Diagnosis present

## 2020-06-08 DIAGNOSIS — Z713 Dietary counseling and surveillance: Secondary | ICD-10-CM | POA: Diagnosis not present

## 2020-06-08 DIAGNOSIS — D72829 Elevated white blood cell count, unspecified: Secondary | ICD-10-CM | POA: Diagnosis not present

## 2020-06-08 DIAGNOSIS — I70202 Unspecified atherosclerosis of native arteries of extremities, left leg: Secondary | ICD-10-CM | POA: Diagnosis not present

## 2020-06-08 DIAGNOSIS — Z7902 Long term (current) use of antithrombotics/antiplatelets: Secondary | ICD-10-CM

## 2020-06-08 DIAGNOSIS — I70222 Atherosclerosis of native arteries of extremities with rest pain, left leg: Secondary | ICD-10-CM | POA: Diagnosis not present

## 2020-06-08 DIAGNOSIS — J449 Chronic obstructive pulmonary disease, unspecified: Secondary | ICD-10-CM | POA: Diagnosis not present

## 2020-06-08 DIAGNOSIS — K5900 Constipation, unspecified: Secondary | ICD-10-CM | POA: Diagnosis not present

## 2020-06-08 DIAGNOSIS — Z79899 Other long term (current) drug therapy: Secondary | ICD-10-CM

## 2020-06-08 DIAGNOSIS — D638 Anemia in other chronic diseases classified elsewhere: Secondary | ICD-10-CM | POA: Diagnosis present

## 2020-06-08 HISTORY — PX: AMPUTATION: SHX166

## 2020-06-08 HISTORY — PX: OTHER SURGICAL HISTORY: SHX169

## 2020-06-08 LAB — CBC
HCT: 31.7 % — ABNORMAL LOW (ref 36.0–46.0)
Hemoglobin: 9.4 g/dL — ABNORMAL LOW (ref 12.0–15.0)
MCH: 24.8 pg — ABNORMAL LOW (ref 26.0–34.0)
MCHC: 29.7 g/dL — ABNORMAL LOW (ref 30.0–36.0)
MCV: 83.6 fL (ref 80.0–100.0)
Platelets: 646 10*3/uL — ABNORMAL HIGH (ref 150–400)
RBC: 3.79 MIL/uL — ABNORMAL LOW (ref 3.87–5.11)
RDW: 17.6 % — ABNORMAL HIGH (ref 11.5–15.5)
WBC: 17.1 10*3/uL — ABNORMAL HIGH (ref 4.0–10.5)
nRBC: 0 % (ref 0.0–0.2)

## 2020-06-08 LAB — COMPREHENSIVE METABOLIC PANEL
ALT: 11 U/L (ref 0–44)
AST: 10 U/L — ABNORMAL LOW (ref 15–41)
Albumin: 2.5 g/dL — ABNORMAL LOW (ref 3.5–5.0)
Alkaline Phosphatase: 68 U/L (ref 38–126)
Anion gap: 14 (ref 5–15)
BUN: 17 mg/dL (ref 8–23)
CO2: 27 mmol/L (ref 22–32)
Calcium: 8.3 mg/dL — ABNORMAL LOW (ref 8.9–10.3)
Chloride: 96 mmol/L — ABNORMAL LOW (ref 98–111)
Creatinine, Ser: 6.21 mg/dL — ABNORMAL HIGH (ref 0.44–1.00)
GFR, Estimated: 7 mL/min — ABNORMAL LOW (ref >60)
Glucose, Bld: 99 mg/dL (ref 70–99)
Potassium: 3.8 mmol/L (ref 3.5–5.1)
Sodium: 137 mmol/L (ref 135–145)
Total Bilirubin: 0.6 mg/dL (ref 0.3–1.2)
Total Protein: 7.8 g/dL (ref 6.5–8.1)

## 2020-06-08 LAB — PROTIME-INR
INR: 1.2 (ref 0.8–1.2)
Prothrombin Time: 14.8 seconds (ref 11.4–15.2)

## 2020-06-08 LAB — POCT I-STAT, CHEM 8
BUN: 22 mg/dL (ref 8–23)
Calcium, Ion: 1.05 mmol/L — ABNORMAL LOW (ref 1.15–1.40)
Chloride: 97 mmol/L — ABNORMAL LOW (ref 98–111)
Creatinine, Ser: 5.4 mg/dL — ABNORMAL HIGH (ref 0.44–1.00)
Glucose, Bld: 153 mg/dL — ABNORMAL HIGH (ref 70–99)
HCT: 31 % — ABNORMAL LOW (ref 36.0–46.0)
Hemoglobin: 10.5 g/dL — ABNORMAL LOW (ref 12.0–15.0)
Potassium: 3.8 mmol/L (ref 3.5–5.1)
Sodium: 137 mmol/L (ref 135–145)
TCO2: 28 mmol/L (ref 22–32)

## 2020-06-08 LAB — HEMOGLOBIN A1C
Hgb A1c MFr Bld: 5.8 % — ABNORMAL HIGH (ref 4.8–5.6)
Mean Plasma Glucose: 119.76 mg/dL

## 2020-06-08 LAB — GLUCOSE, CAPILLARY
Glucose-Capillary: 90 mg/dL (ref 70–99)
Glucose-Capillary: 107 mg/dL — ABNORMAL HIGH (ref 70–99)
Glucose-Capillary: 120 mg/dL — ABNORMAL HIGH (ref 70–99)
Glucose-Capillary: 183 mg/dL — ABNORMAL HIGH (ref 70–99)
Glucose-Capillary: 160 mg/dL — ABNORMAL HIGH (ref 70–99)

## 2020-06-08 LAB — HEMOGLOBIN AND HEMATOCRIT, BLOOD
HCT: 31.4 % — ABNORMAL LOW (ref 36.0–46.0)
Hemoglobin: 9.5 g/dL — ABNORMAL LOW (ref 12.0–15.0)

## 2020-06-08 LAB — APTT: aPTT: 38 seconds — ABNORMAL HIGH (ref 24–36)

## 2020-06-08 SURGERY — AMPUTATION BELOW KNEE
Anesthesia: General | Site: Knee | Laterality: Left

## 2020-06-08 MED ORDER — HYDROMORPHONE HCL 1 MG/ML IJ SOLN
INTRAMUSCULAR | Status: AC
  Filled 2020-06-08: qty 1

## 2020-06-08 MED ORDER — CHLORHEXIDINE GLUCONATE CLOTH 2 % EX PADS: 6.0000 | MEDICATED_PAD | Freq: Once | CUTANEOUS | Status: DC

## 2020-06-08 MED ORDER — INSULIN ASPART 100 UNIT/ML ~~LOC~~ SOLN: 0.0000 [IU] | Freq: Every day | SUBCUTANEOUS | Status: DC

## 2020-06-08 MED ORDER — DEXAMETHASONE SODIUM PHOSPHATE 10 MG/ML IJ SOLN
INTRAMUSCULAR | Status: DC | PRN
  Administered 2020-06-08: 10 mg via INTRAVENOUS

## 2020-06-08 MED ORDER — CHLORHEXIDINE GLUCONATE CLOTH 2 % EX PADS
6.0000 | MEDICATED_PAD | Freq: Every day | CUTANEOUS | Status: DC
  Administered 2020-06-10 – 2020-06-16 (×7): 6 via TOPICAL

## 2020-06-08 MED ORDER — OXYCODONE HCL 5 MG PO TABS: 5.0000 mg | ORAL_TABLET | Freq: Once | ORAL | Status: DC | PRN

## 2020-06-08 MED ORDER — FENTANYL CITRATE (PF) 250 MCG/5ML IJ SOLN
INTRAMUSCULAR | Status: DC | PRN
  Administered 2020-06-08 (×2): 50 ug via INTRAVENOUS

## 2020-06-08 MED ORDER — CEFAZOLIN SODIUM-DEXTROSE 2-4 GM/100ML-% IV SOLN
2.0000 g | INTRAVENOUS | Status: AC
  Administered 2020-06-08: 2 g via INTRAVENOUS
  Filled 2020-06-08 (×2): qty 100

## 2020-06-08 MED ORDER — BUPIVACAINE-EPINEPHRINE (PF) 0.5% -1:200000 IJ SOLN
INTRAMUSCULAR | Status: DC | PRN
  Administered 2020-06-08: 10 mL via PERINEURAL
  Administered 2020-06-08: 20 mL via PERINEURAL

## 2020-06-08 MED ORDER — HYDROMORPHONE HCL 1 MG/ML IJ SOLN
0.2500 mg | INTRAMUSCULAR | Status: DC | PRN
  Administered 2020-06-08: 0.25 mg via INTRAVENOUS

## 2020-06-08 MED ORDER — ATORVASTATIN CALCIUM 40 MG PO TABS
40.0000 mg | ORAL_TABLET | Freq: Every day | ORAL | Status: DC
  Administered 2020-06-08 – 2020-06-15 (×8): 40 mg via ORAL
  Filled 2020-06-08 (×8): qty 1

## 2020-06-08 MED ORDER — BACITRACIN ZINC 500 UNIT/GM EX OINT
TOPICAL_OINTMENT | CUTANEOUS | Status: DC | PRN
  Administered 2020-06-08: 1 via TOPICAL

## 2020-06-08 MED ORDER — GLYCOPYRROLATE PF 0.2 MG/ML IJ SOSY
PREFILLED_SYRINGE | INTRAMUSCULAR | Status: AC
  Filled 2020-06-08: qty 1

## 2020-06-08 MED ORDER — ACETAMINOPHEN 325 MG PO TABS
650.0000 mg | ORAL_TABLET | Freq: Four times a day (QID) | ORAL | Status: DC | PRN
  Administered 2020-06-09 – 2020-06-13 (×3): 650 mg via ORAL
  Filled 2020-06-08: qty 2

## 2020-06-08 MED ORDER — ACETAMINOPHEN 650 MG RE SUPP: 650.0000 mg | Freq: Four times a day (QID) | RECTAL | Status: DC | PRN

## 2020-06-08 MED ORDER — CHLORHEXIDINE GLUCONATE 0.12 % MT SOLN
OROMUCOSAL | Status: AC
  Administered 2020-06-08: 15 mL
  Filled 2020-06-08: qty 15

## 2020-06-08 MED ORDER — FENTANYL CITRATE (PF) 250 MCG/5ML IJ SOLN
INTRAMUSCULAR | Status: AC
  Filled 2020-06-08: qty 5

## 2020-06-08 MED ORDER — MIDAZOLAM HCL 2 MG/2ML IJ SOLN
INTRAMUSCULAR | Status: DC | PRN
  Administered 2020-06-08: 2 mg via INTRAVENOUS

## 2020-06-08 MED ORDER — RENA-VITE PO TABS
1.0000 | ORAL_TABLET | Freq: Every day | ORAL | Status: DC
  Administered 2020-06-08 – 2020-06-15 (×8): 1 via ORAL
  Filled 2020-06-08 (×8): qty 1

## 2020-06-08 MED ORDER — OXYCODONE HCL 5 MG/5ML PO SOLN: 5.0000 mg | Freq: Once | ORAL | Status: DC | PRN

## 2020-06-08 MED ORDER — SEVELAMER CARBONATE 800 MG PO TABS
2400.0000 mg | ORAL_TABLET | Freq: Three times a day (TID) | ORAL | Status: DC
  Administered 2020-06-08 – 2020-06-16 (×22): 2400 mg via ORAL
  Filled 2020-06-08 (×22): qty 3

## 2020-06-08 MED ORDER — SODIUM CHLORIDE 0.9 % IV SOLN: INTRAVENOUS | Status: DC

## 2020-06-08 MED ORDER — GLYCOPYRROLATE 0.2 MG/ML IJ SOLN
INTRAMUSCULAR | Status: DC | PRN
  Administered 2020-06-08: .2 mg via INTRAVENOUS

## 2020-06-08 MED ORDER — LIDOCAINE 2% (20 MG/ML) 5 ML SYRINGE
INTRAMUSCULAR | Status: AC
  Filled 2020-06-08: qty 5

## 2020-06-08 MED ORDER — MIDAZOLAM HCL 2 MG/2ML IJ SOLN
INTRAMUSCULAR | Status: AC
  Filled 2020-06-08: qty 2

## 2020-06-08 MED ORDER — DIPHENHYDRAMINE HCL 25 MG PO CAPS: 25.0000 mg | ORAL_CAPSULE | Freq: Four times a day (QID) | ORAL | Status: DC | PRN

## 2020-06-08 MED ORDER — MEPERIDINE HCL 25 MG/ML IJ SOLN
6.2500 mg | INTRAMUSCULAR | Status: DC | PRN
Start: 2020-06-08 — End: 2020-06-08

## 2020-06-08 MED ORDER — ONDANSETRON HCL 4 MG/2ML IJ SOLN
INTRAMUSCULAR | Status: DC | PRN
  Administered 2020-06-08: 4 mg via INTRAVENOUS

## 2020-06-08 MED ORDER — 0.9 % SODIUM CHLORIDE (POUR BTL) OPTIME
TOPICAL | Status: DC | PRN
  Administered 2020-06-08: 1000 mL

## 2020-06-08 MED ORDER — INSULIN GLARGINE 100 UNIT/ML ~~LOC~~ SOLN
20.0000 [IU] | Freq: Every day | SUBCUTANEOUS | Status: DC
  Administered 2020-06-08 – 2020-06-16 (×9): 20 [IU] via SUBCUTANEOUS
  Filled 2020-06-08 (×9): qty 0.2

## 2020-06-08 MED ORDER — BACITRACIN ZINC 500 UNIT/GM EX OINT
TOPICAL_OINTMENT | CUTANEOUS | Status: AC
  Filled 2020-06-08: qty 28.35

## 2020-06-08 MED ORDER — MIDAZOLAM HCL 2 MG/2ML IJ SOLN: 0.5000 mg | Freq: Once | INTRAMUSCULAR | Status: DC | PRN

## 2020-06-08 MED ORDER — CLOPIDOGREL BISULFATE 75 MG PO TABS
75.0000 mg | ORAL_TABLET | Freq: Every day | ORAL | Status: DC
  Administered 2020-06-09 – 2020-06-16 (×8): 75 mg via ORAL
  Filled 2020-06-08 (×8): qty 1

## 2020-06-08 MED ORDER — PROPOFOL 10 MG/ML IV BOLUS
INTRAVENOUS | Status: DC | PRN
  Administered 2020-06-08: 200 mg via INTRAVENOUS

## 2020-06-08 MED ORDER — DEXAMETHASONE SODIUM PHOSPHATE 10 MG/ML IJ SOLN
INTRAMUSCULAR | Status: AC
  Filled 2020-06-08: qty 1

## 2020-06-08 MED ORDER — INSULIN ASPART 100 UNIT/ML ~~LOC~~ SOLN
0.0000 [IU] | Freq: Three times a day (TID) | SUBCUTANEOUS | Status: DC
  Administered 2020-06-08: 3 [IU] via SUBCUTANEOUS
  Administered 2020-06-09 – 2020-06-10 (×5): 2 [IU] via SUBCUTANEOUS
  Administered 2020-06-12: 3 [IU] via SUBCUTANEOUS
  Administered 2020-06-13: 2 [IU] via SUBCUTANEOUS
  Administered 2020-06-14: 3 [IU] via SUBCUTANEOUS
  Administered 2020-06-15: 2 [IU] via SUBCUTANEOUS
  Administered 2020-06-15: 3 [IU] via SUBCUTANEOUS
  Administered 2020-06-16: 2 [IU] via SUBCUTANEOUS

## 2020-06-08 MED ORDER — AMLODIPINE BESYLATE 10 MG PO TABS
10.0000 mg | ORAL_TABLET | Freq: Every day | ORAL | Status: DC
  Administered 2020-06-08 – 2020-06-15 (×8): 10 mg via ORAL
  Filled 2020-06-08 (×8): qty 1

## 2020-06-08 MED ORDER — PROPOFOL 10 MG/ML IV BOLUS
INTRAVENOUS | Status: AC
  Filled 2020-06-08: qty 20

## 2020-06-08 MED ORDER — HEPARIN SODIUM (PORCINE) 5000 UNIT/ML IJ SOLN
5000.0000 [IU] | Freq: Three times a day (TID) | INTRAMUSCULAR | Status: DC
  Administered 2020-06-09 – 2020-06-16 (×22): 5000 [IU] via SUBCUTANEOUS
  Filled 2020-06-08 (×23): qty 1

## 2020-06-08 MED ORDER — PROMETHAZINE HCL 25 MG/ML IJ SOLN: 6.2500 mg | INTRAMUSCULAR | Status: DC | PRN

## 2020-06-08 MED ORDER — ONDANSETRON HCL 4 MG/2ML IJ SOLN
INTRAMUSCULAR | Status: AC
  Filled 2020-06-08: qty 2

## 2020-06-08 MED ORDER — SODIUM CHLORIDE 0.9% FLUSH
3.0000 mL | Freq: Two times a day (BID) | INTRAVENOUS | Status: DC
  Administered 2020-06-08 – 2020-06-16 (×16): 3 mL via INTRAVENOUS

## 2020-06-08 MED ORDER — LIDOCAINE HCL (CARDIAC) PF 100 MG/5ML IV SOSY
PREFILLED_SYRINGE | INTRAVENOUS | Status: DC | PRN
  Administered 2020-06-08: 50 mg via INTRATRACHEAL

## 2020-06-08 MED ORDER — SEVELAMER CARBONATE 800 MG PO TABS
1600.0000 mg | ORAL_TABLET | ORAL | Status: DC | PRN
  Filled 2020-06-08: qty 2

## 2020-06-08 MED ORDER — BUPIVACAINE LIPOSOME 1.3 % IJ SUSP
INTRAMUSCULAR | Status: DC | PRN
  Administered 2020-06-08: 2 mL via PERINEURAL
  Administered 2020-06-08: 8 mL via PERINEURAL

## 2020-06-08 MED ORDER — ASPIRIN EC 81 MG PO TBEC
81.0000 mg | DELAYED_RELEASE_TABLET | Freq: Every day | ORAL | Status: DC
  Administered 2020-06-08 – 2020-06-15 (×8): 81 mg via ORAL
  Filled 2020-06-08 (×8): qty 1

## 2020-06-08 MED ORDER — OXYCODONE-ACETAMINOPHEN 5-325 MG PO TABS
1.0000 | ORAL_TABLET | Freq: Three times a day (TID) | ORAL | Status: DC | PRN
  Administered 2020-06-08 – 2020-06-13 (×7): 1 via ORAL
  Filled 2020-06-08 (×7): qty 1

## 2020-06-08 MED ORDER — SODIUM CHLORIDE 0.9 % IV SOLN: INTRAVENOUS | Status: DC | PRN

## 2020-06-08 SURGICAL SUPPLY — 58 items
APL PRP STRL LF DISP 70% ISPRP (MISCELLANEOUS) ×1
BANDAGE ESMARK 6X9 LF (GAUZE/BANDAGES/DRESSINGS) ×1 IMPLANT
BLADE CORE FAN STRYKER (BLADE) ×2 IMPLANT
BLADE LONG MED 31X9 (MISCELLANEOUS) IMPLANT
BLADE SAGITTAL (BLADE)
BLADE SAGITTAL 25.0X1.19X90 (BLADE) ×2 IMPLANT
BLADE SAW GIGLI 510 (BLADE) ×2 IMPLANT
BLADE SAW THK.89X75X18XSGTL (BLADE) IMPLANT
BLADE SURG 21 STRL SS (BLADE) ×2 IMPLANT
BNDG CMPR 9X6 STRL LF SNTH (GAUZE/BANDAGES/DRESSINGS) ×1
BNDG COHESIVE 6X5 TAN STRL LF (GAUZE/BANDAGES/DRESSINGS) ×2 IMPLANT
BNDG ELASTIC 4X5.8 VLCR STR LF (GAUZE/BANDAGES/DRESSINGS) ×2 IMPLANT
BNDG ELASTIC 6X5.8 VLCR STR LF (GAUZE/BANDAGES/DRESSINGS) ×2 IMPLANT
BNDG ESMARK 6X9 LF (GAUZE/BANDAGES/DRESSINGS) ×2
BNDG GAUZE ELAST 4 BULKY (GAUZE/BANDAGES/DRESSINGS) ×2 IMPLANT
CANISTER SUCT 3000ML PPV (MISCELLANEOUS) ×2 IMPLANT
CHLORAPREP W/TINT 26 (MISCELLANEOUS) ×2 IMPLANT
CLIP VESOCCLUDE MED 6/CT (CLIP) IMPLANT
COVER SURGICAL LIGHT HANDLE (MISCELLANEOUS) ×2 IMPLANT
COVER WAND RF STERILE (DRAPES) ×2 IMPLANT
DRAIN CHANNEL 19F RND (DRAIN) IMPLANT
DRAPE HALF SHEET 40X57 (DRAPES) ×2 IMPLANT
DRAPE ORTHO SPLIT 77X108 STRL (DRAPES) ×4
DRAPE SURG ORHT 6 SPLT 77X108 (DRAPES) ×2 IMPLANT
ELECT REM PT RETURN 9FT ADLT (ELECTROSURGICAL) ×2
ELECTRODE REM PT RTRN 9FT ADLT (ELECTROSURGICAL) ×1 IMPLANT
EVACUATOR SILICONE 100CC (DRAIN) IMPLANT
GAUZE SPONGE 4X4 12PLY STRL (GAUZE/BANDAGES/DRESSINGS) ×2 IMPLANT
GAUZE XEROFORM 1X8 LF (GAUZE/BANDAGES/DRESSINGS) ×2 IMPLANT
GAUZE XEROFORM 5X9 LF (GAUZE/BANDAGES/DRESSINGS) ×2 IMPLANT
GLOVE SURG SS PI 8.0 STRL IVOR (GLOVE) ×2 IMPLANT
GOWN STRL REUS W/ TWL LRG LVL3 (GOWN DISPOSABLE) ×2 IMPLANT
GOWN STRL REUS W/ TWL XL LVL3 (GOWN DISPOSABLE) ×1 IMPLANT
GOWN STRL REUS W/TWL LRG LVL3 (GOWN DISPOSABLE) ×4
GOWN STRL REUS W/TWL XL LVL3 (GOWN DISPOSABLE) ×2
KIT BASIN OR (CUSTOM PROCEDURE TRAY) ×2 IMPLANT
KIT TURNOVER KIT B (KITS) ×2 IMPLANT
NS IRRIG 1000ML POUR BTL (IV SOLUTION) ×2 IMPLANT
PACK GENERAL/GYN (CUSTOM PROCEDURE TRAY) ×2 IMPLANT
PAD ARMBOARD 7.5X6 YLW CONV (MISCELLANEOUS) ×4 IMPLANT
PENCIL SMOKE EVACUATOR (MISCELLANEOUS) ×2 IMPLANT
SPONGE LAP 18X18 RF (DISPOSABLE) ×2 IMPLANT
STAPLER SKIN 35 REG (STAPLE) ×2 IMPLANT
STAPLER VISISTAT 35W (STAPLE) ×2 IMPLANT
STOCKINETTE IMPERVIOUS LG (DRAPES) ×2 IMPLANT
SUT BONE WAX W31G (SUTURE) IMPLANT
SUT ETHILON 2 0 PSLX (SUTURE) ×4 IMPLANT
SUT ETHILON 3 0 PS 1 (SUTURE) IMPLANT
SUT SILK 0 TIES 10X30 (SUTURE) ×2 IMPLANT
SUT SILK 2 0 (SUTURE) ×2
SUT SILK 2 0 SH CR/8 (SUTURE) ×2 IMPLANT
SUT SILK 2-0 18XBRD TIE 12 (SUTURE) ×1 IMPLANT
SUT SILK 3 0 (SUTURE)
SUT SILK 3-0 18XBRD TIE 12 (SUTURE) IMPLANT
SUT VIC AB 2-0 CT1 18 (SUTURE) ×6 IMPLANT
TOWEL GREEN STERILE (TOWEL DISPOSABLE) ×4 IMPLANT
UNDERPAD 30X36 HEAVY ABSORB (UNDERPADS AND DIAPERS) ×2 IMPLANT
WATER STERILE IRR 1000ML POUR (IV SOLUTION) ×2 IMPLANT

## 2020-06-08 NOTE — Progress Notes (Signed)
Orthopedic Tech Progress Note Patient Details:  Sydny Lobello [facility] Nov 27, 1955 [id number]  Ortho Devices Type of Ortho Device: Knee Immobilizer Ortho Device/Splint Location: LLE Ortho Device/Splint Interventions: Ordered,Application,Adjustment   Post Interventions Patient Tolerated: Well Instructions Provided: Care of device,Poper ambulation with device   Lonni Dirden 06/08/2020, 11:40 AM

## 2020-06-08 NOTE — Consult Note (Signed)
Renal Service Consult Note [facility]  Christy Zhang 06/08/2020 Christy Blazing, MD Requesting Physician: Dr Harvest Forest  Reason for Consult: ESRD pt sp L BKA HPI: The patient is a 65 y.o. year-old w/ hx of IDDM, ESRD on HD, HTN, HL, PAD and gangrene L foot sp L TMA, presents for nonhealing TMA wound.  Pt underwent L BKA today. Asked to see for ESRD.    Pt gets HD in [place] , [place] w/ [facility].  Has been on HD for about 7 years, using R arm AVG.  No HD issues, good compliance per the pt.    Denies any SOB, leg swelling, CP.     ROS  no joint pain   no HA  no blurry vision  no rash  no diarrhea  no nausea/ vomiting     Past Medical History  Past Medical History:  Diagnosis Date  . Anemia   . Arthritis   . Asthma    in the past  . CHF (congestive heart failure) ([place])   . Chronic kidney disease    Renal failure, dialysis on M/W/F  . 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 End stage renal disease ([place])   . End stage renal disease on dialysis ([place])   . Hemodialysis access, arteriovenous graft ([place])   . Hyperlipidemia   . Hypertension   . Neuromuscular disorder [facility])    Past Surgical History  Past Surgical History:  Procedure Laterality Date  .  LEFT BELOW KNEE AMPUTATION (Left Knee)  06/08/2020  . A/V FISTULAGRAM Right 02/04/2017   Procedure: A/V Fistulagram;  Surgeon: Waynetta Sandy, MD;  Location: [place] CV LAB;  Service: Cardiovascular;  Laterality: Right;  . ABDOMINAL AORTOGRAM W/LOWER EXTREMITY N/A 11/18/2019   Procedure: ABDOMINAL AORTOGRAM W/LOWER EXTREMITY;  Surgeon: [name], MD;  Location: [place] CV LAB;  Service: Cardiovascular;  Laterality: N/A;  . ABDOMINAL AORTOGRAM W/LOWER EXTREMITY N/A 02/23/2020   Procedure: ABDOMINAL AORTOGRAM W/LOWER EXTREMITY;  Surgeon: [name], MD;  Location: [place] CV LAB;  Service: Cardiovascular;  Laterality: N/A;  Bilateral  . AV FISTULA  PLACEMENT Right 08/25/2014   Procedure: RIGHT BRACHIOCEPHALIC ARTERIOVENOUS (AV) FISTULA CREATION;  Surgeon: Mal Misty, MD;  Location: [facility] OR;  Service: Vascular;  Laterality: Right;  . AV FISTULA PLACEMENT Right 03/18/2019   Procedure: INSERTION OF ARTERIOVENOUS (AV) GORE-TEX GRAFT RIGHT ARM;  Surgeon: Serafina Mitchell, MD;  Location: [place];  Service: Vascular;  Laterality: Right;  . BASCILIC VEIN TRANSPOSITION Right 12/24/2018   Procedure: FIRST STAGE BASILIC VEIN TRANSPOSITION RIGHT UPPER ARM;  Surgeon: Serafina Mitchell, MD;  Location: [place];  Service: Vascular;  Laterality: Right;  . FISTULA SUPERFICIALIZATION Right 11/03/2014   Procedure: RIGHT UPPER ARM FISTULA SUPERFICIALIZATION;  Surgeon: Mal Misty, MD;  Location: [place];  Service: Vascular;  Laterality: Right;  . FISTULOGRAM Right 01/16/2016   Procedure: RIGHT UPPER EXTREMITY ARTERIOVENOUS FISTULOGRAM WITH BALLOON ANGIOPLASTY;  Surgeon: Vickie Epley, MD;  [facility];  Service: Vascular;  Laterality: Right;  . INSERTION OF DIALYSIS CATHETER     X4  . IR FLUORO GUIDE CV LINE RIGHT  12/03/2018  . IR REMOVAL TUN CV CATH W/O FL  07/06/2019  . IR THROMBECTOMY AV FISTULA W/THROMBOLYSIS/PTA INC/SHUNT/IMG RIGHT Right 12/03/2018  . IR THROMBECTOMY AV FISTULA W/THROMBOLYSIS/PTA/STENT INC/SHUNT/IMG RT Right 10/11/2019  . IR US GUIDE VASC ACCESS RIGHT  12/03/2018  . IR US GUIDE VASC  ACCESS RIGHT  12/03/2018  . IR US GUIDE VASC ACCESS RIGHT  10/14/2019  . LIGATION OF COMPETING BRANCHES OF ARTERIOVENOUS FISTULA Right 11/03/2014   Procedure: LIGATION OF COMPETING BRANCHE x1 OF RIGHT UPPER ARM ARTERIOVENOUS FISTULA;  Surgeon: Mal Misty, MD;  Location: [place];  Service: Vascular;  Laterality: Right;  . PERIPHERAL VASCULAR BALLOON ANGIOPLASTY Right 02/04/2017   Procedure: PERIPHERAL VASCULAR BALLOON ANGIOPLASTY;  Surgeon: Waynetta Sandy, MD;  Location: [place] CV LAB;  Service: Cardiovascular;  Laterality: Right;  . PERIPHERAL VASCULAR  BALLOON ANGIOPLASTY Right 11/18/2019   Procedure: PERIPHERAL VASCULAR BALLOON ANGIOPLASTY;  Surgeon: [name], MD;  Location: [place] CV LAB;  Service: Cardiovascular;  Laterality: Right;  anterior tibial  . PERIPHERAL VASCULAR BALLOON ANGIOPLASTY  02/23/2020   Procedure: PERIPHERAL VASCULAR BALLOON ANGIOPLASTY;  Surgeon: [name], MD;  Location: [place] CV LAB;  Service: Cardiovascular;;  Lt  AT  . TRANSMETATARSAL AMPUTATION Left 02/24/2020   Procedure: TRANSMETATARSAL AMPUTATION LEFT;  Surgeon: Cherre Robins, MD;  Location: [place];  Service: Vascular;  Laterality: Left;  . TUBAL LIGATION     Family History  Family History  Problem Relation Age of Onset  . Diabetes Mother   . Hypertension Mother   . Cancer Father   . Diabetes Father   . Hypertension Sister   . Diabetes Daughter   . Heart disease Daughter   . Hypertension Daughter   . Diabetes Son   . Heart disease Son   . Hypertension Son   . Peripheral vascular disease Son        amputation   Social History  reports that she quit smoking about 35 years ago. Her smoking use included cigarettes. She has never used smokeless tobacco. She reports that she does not drink alcohol and does not use drugs. Allergies  Allergies  Allergen Reactions  . Sulfa Antibiotics Swelling   Home medications Prior to Admission medications   Medication Sig Start Date End Date Taking? Authorizing Provider  acetaminophen (TYLENOL) 325 MG tablet Take 2 tablets (650 mg total) by mouth every 6 (six) hours as needed for mild pain (or Fever >/= 101). 10/15/19  Yes Emokpae, Courage, MD  amLODipine (NORVASC) 10 MG tablet Take 1 tablet (10 mg total) by mouth at bedtime. 04/09/20 05/09/20 Yes Claretta Fraise, MD  aspirin EC 81 MG tablet Take 81 mg by mouth at bedtime. Swallow whole.   Yes [provider]  atorvastatin (LIPITOR) 40 MG tablet Take 1 tablet (40 mg total) by mouth daily. Patient taking differently: Take 40 mg  by mouth at bedtime. 08/17/19  Yes Claretta Fraise, MD  clopidogrel (PLAVIX) 75 MG tablet Take 1 tablet (75 mg total) by mouth daily. 11/18/19 11/17/20 Yes [name], MD  diphenhydrAMINE (BENADRYL) 25 MG tablet Take 25 mg by mouth every 6 (six) hours as needed for itching.   Yes [provider]  Dulaglutide (TRULICITY) 1.5 0000000 SOPN Inject 1.5 mg into the skin once a week. Saturday 04/09/20  Yes [name], [name], MD  Insulin Glargine (TOUJEO MAX SOLOSTAR [place]) Inject 100 Units into the skin daily.   Yes [provider]  metoprolol succinate (TOPROL-XL) 100 MG 24 hr tablet Take 1 tablet (100 mg total) by mouth daily. TAKE ONE TABLET BY MOUTH DAILY. TAKE WITH OR IMMEDIATELY FOLLOWING A MEAL. Patient taking differently: Take 100 mg by mouth daily with supper. TAKE WITH OR IMMEDIATELY FOLLOWING A MEAL. 04/09/20  Yes Claretta Fraise, MD  oxyCODONE-acetaminophen [facility])  5-325 MG tablet Take 1 tablet by mouth every 8 (eight) hours as needed for severe pain. 03/20/20 03/20/21 Yes Ulyses Amor, PA-C  sevelamer carbonate (RENVELA) 800 MG tablet Take 1,600-2,400 mg by mouth See admin instructions. Take 2400 mg with each meal and 1600 mg with each snack   Yes [provider]  Insulin Degludec (TRESIBA) 100 UNIT/ML SOLN Inject 100 Units into the skin daily. Patient not taking: No sig reported 05/15/20   Claretta Fraise, MD  multivitamin (RENA-VIT) TABS tablet Take 1 tablet by mouth at bedtime.     [provider]  oxyCODONE-acetaminophen (PERCOCET) 5-325 MG tablet Take 1 tablet by mouth every 8 (eight) hours as needed for up to 10 doses for severe pain. 02/27/20   Darliss Cheney, MD     Vitals:   06/08/20 1117 06/08/20 1133 06/08/20 1200 06/08/20 1300  BP:  (!) 121/57 139/68 140/68  Pulse: 66 66 78 78  Resp: '14 14 17 17  '$ Temp:  (!) 97.2 F (36.2 C) 97.7 F (36.5 C) 97.7 F (36.5 C)  TempSrc:   Oral Oral  SpO2: 94% 95% 97% 98%  Weight:      Height:        Exam Gen alert, no distress, calm No rash, cyanosis or gangrene Sclera anicteric, throat clear  No jvd or bruits Chest clear bilat to bases RRR no MRG Abd soft ntnd no mass or ascites +bs GU  defer MS no joint effusions or deformity, L BKA w/ new dressing Ext no LE or UE edema Neuro is alert, Ox 3 , nf RUA AVG +bruit    Home meds:  - norvasc 10/ asa 81 / lipitor 40/ plavix 75  - trulicity 1.5 mg weekly/ insulin glargine 100u qd  - percocet qid prn  - renvela 3 ac tid  - prn's/ vitamins/ supplements    OP HD: [facility] MWF  - RUA AVG, details to follow     Na 137  K 3.8 BUN 22 Cr 5.40    Hb 9.5   BP 140/68 HR 78    RR 16  RA 98%   Assessment/ Plan: 1. 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 ESRD - on HD MWF. Plan HD tomorrow.  3. HTN/ volume - no sig vol ^ on exam. UF 2 L at hd tomorrow 4. HTN - cont norvasc, control vol 5. DM2 - per pmd 6. Anemia ckd - Hb 9-10 range. Follow. 7. MBD ckd - cont binder, get records.       Kelly Splinter  MD 06/08/2020, 4:48 PM  Recent Labs  Lab 06/08/20 0550 06/08/20 0812 06/08/20 1240  WBC 17.1*  --   --   HGB 9.4* 10.5* 9.5*   Recent Labs  Lab 06/08/20 0550 06/08/20 0812  K 3.8 3.8  BUN 17 22  CREATININE 6.21* 5.40*  CALCIUM 8.3*  --

## 2020-06-08 NOTE — TOC Initial Note (Addendum)
Transition of Care [facility]) - Initial/Assessment Note    Patient Details  Name: Christy Zhang MRN: [id number] Date of Birth: [birth year]  Transition of Care [facility]) CM/SW Contact:    Sharin Mons, RN Phone Number: 06/08/2020, 4:36 PM  Clinical Narrative:    Admitted with nonhealing transmetatarsal amputation of the left foot with gangrene.   Pt pod # 0 , s/p BKA.            Consult received : home health/DME/ SNF placement.  PT/OT evaluations  pending ...   TOC will continue to monitor and assist with needs    Expected Discharge Plan: [place] (CIR vs SNF) Barriers to Discharge: Continued Medical Work up   Patient Goals and CMS Choice        Expected Discharge Plan and Services Expected Discharge Plan: [place] (CIR vs SNF)                                              Prior Living Arrangements/Services                       Activities of Daily Living Home Assistive Devices/Equipment: CBG Meter,Wheelchair,Walker (specify type),Eyeglasses,Scales ADL Screening (condition at time of admission) Patient's cognitive ability adequate to safely complete daily activities?: Yes Is the patient deaf or have difficulty hearing?: No Does the patient have difficulty seeing, even when wearing glasses/contacts?: No Does the patient have difficulty concentrating, remembering, or making decisions?: No Patient able to express need for assistance with ADLs?: Yes Does the patient have difficulty dressing or bathing?: No Independently performs ADLs?: Yes (appropriate for developmental age) Does the patient have difficulty walking or climbing stairs?: No Weakness of Legs: None Weakness of Arms/Hands: None  Permission Sought/Granted                  Emotional Assessment              Admission diagnosis:  S/P BKA (below knee amputation) unilateral ([place]) [Z89.519] Patient Active Problem List   Diagnosis Date  Noted   S/P BKA (below knee amputation) unilateral ([place]) 06/08/2020   Gangrene ([place])    Abdominal wall cellulitis    Panniculitis 12/29/2019   Osteomyelitis ([place]) 10/15/2019   Hyperglycemia due to diabetes mellitus ([place]) 10/15/2019   Osteomyelitis of third toe of right foot ([place]) 10/14/2019   Controlled type 2 diabetes mellitus with chronic kidney disease on chronic dialysis, with long-term current use of insulin ([place]) 12/02/2016   ESRD (end stage renal disease) ([place])    Asthma, mild persistent 07/25/2015   Hypertension secondary to other renal disorders 07/25/2015   Diabetes ([place]) 03/30/2015   Anemia in chronic kidney disease 02/17/2015   Osteopathy in diseases classified elsewhere, unspecified site 02/17/2015   Essential (primary) hypertension 06/24/2014   Heart failure ([place]) 06/24/2014   Obesity, Class III, BMI 40-49.9 (morbid obesity) ([place]) 06/24/2014   PCP:  Claretta Fraise, MD Pharmacy:   [place], [place] [address] [place] [address] Phone: [phone] Fax: [phone]     Social Determinants of Health (SDOH) Interventions    Readmission Risk Interventions No flowsheet data found.

## 2020-06-08 NOTE — Anesthesia Procedure Notes (Signed)
Procedure Name: LMA Insertion Date/Time: 06/08/2020 7:58 AM Performed by: Claris Che, CRNA Pre-anesthesia Checklist: Patient identified, Emergency Drugs available, Suction available, Patient being monitored and Timeout performed Patient Re-evaluated:Patient Re-evaluated prior to induction Oxygen Delivery Method: Circle system utilized Preoxygenation: Pre-oxygenation with 100% oxygen Induction Type: IV induction Ventilation: Mask ventilation without difficulty LMA Size: 4.5 Number of attempts: 1 Placement Confirmation: positive ETCO2 and breath sounds checked- equal and bilateral Tube secured with: Tape Dental Injury: Teeth and Oropharynx as per pre-operative assessment

## 2020-06-08 NOTE — H&P (Signed)
ASSESSMENT & PLAN:  65 y.o. female with gangrene of left TMA stump. She needs a major amputation. I counseled her about risks / benefits / alternatives. She would like to proceed with LEFT below knee amputation.   CHIEF COMPLAINT:   TMA not healing   HISTORY:  HISTORY OF PRESENT ILLNESS: Christy Zhang is a 65 y.o. female for whom I have performed a L TMA in the past for gangrenous toes. She underwent endovascular revascularization prior to intervention. Unfortunately, her TMA has not healed. She has rescheduled multiple times due to [place], family obligations, etc. On my evaluation, the patient has no complaints. She reports malodor from the wound.   Past Medical History:  Diagnosis Date  . Anemia   . Arthritis   . Asthma    in the past  . CHF (congestive heart failure) ([place])   . Chronic kidney disease    Renal failure, dialysis on M/W/F  . 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 End stage renal disease ([place])   . End stage renal disease on dialysis ([place])   . Hemodialysis access, arteriovenous graft ([place])   . Hyperlipidemia   . Hypertension   . Neuromuscular disorder [facility])     Past Surgical History:  Procedure Laterality Date  . A/V FISTULAGRAM Right 02/04/2017   Procedure: A/V Fistulagram;  Surgeon: Waynetta Sandy, MD;  Location: [place] CV LAB;  Service: Cardiovascular;  Laterality: Right;  . ABDOMINAL AORTOGRAM W/LOWER EXTREMITY N/A 11/18/2019   Procedure: ABDOMINAL AORTOGRAM W/LOWER EXTREMITY;  Surgeon: [name], MD;  Location: [place] CV LAB;  Service: Cardiovascular;  Laterality: N/A;  . ABDOMINAL AORTOGRAM W/LOWER EXTREMITY N/A 02/23/2020   Procedure: ABDOMINAL AORTOGRAM W/LOWER EXTREMITY;  Surgeon: [name], MD;  Location: [place] CV LAB;  Service: Cardiovascular;  Laterality: N/A;  Bilateral  . AV FISTULA PLACEMENT Right 08/25/2014   Procedure: RIGHT BRACHIOCEPHALIC ARTERIOVENOUS (AV) FISTULA CREATION;   Surgeon: Mal Misty, MD;  Location: [facility] OR;  Service: Vascular;  Laterality: Right;  . AV FISTULA PLACEMENT Right 03/18/2019   Procedure: INSERTION OF ARTERIOVENOUS (AV) GORE-TEX GRAFT RIGHT ARM;  Surgeon: Serafina Mitchell, MD;  Location: [place];  Service: Vascular;  Laterality: Right;  . BASCILIC VEIN TRANSPOSITION Right 12/24/2018   Procedure: FIRST STAGE BASILIC VEIN TRANSPOSITION RIGHT UPPER ARM;  Surgeon: Serafina Mitchell, MD;  Location: [place];  Service: Vascular;  Laterality: Right;  . FISTULA SUPERFICIALIZATION Right 11/03/2014   Procedure: RIGHT UPPER ARM FISTULA SUPERFICIALIZATION;  Surgeon: Mal Misty, MD;  Location: [place];  Service: Vascular;  Laterality: Right;  . FISTULOGRAM Right 01/16/2016   Procedure: RIGHT UPPER EXTREMITY ARTERIOVENOUS FISTULOGRAM WITH BALLOON ANGIOPLASTY;  Surgeon: Vickie Epley, MD;  [facility];  Service: Vascular;  Laterality: Right;  . INSERTION OF DIALYSIS CATHETER     X4  . IR FLUORO GUIDE CV LINE RIGHT  12/03/2018  . IR REMOVAL TUN CV CATH W/O FL  07/06/2019  . IR THROMBECTOMY AV FISTULA W/THROMBOLYSIS/PTA INC/SHUNT/IMG RIGHT Right 12/03/2018  . IR THROMBECTOMY AV FISTULA W/THROMBOLYSIS/PTA/STENT INC/SHUNT/IMG RT Right 10/11/2019  . IR US GUIDE VASC ACCESS RIGHT  12/03/2018  . IR US GUIDE VASC ACCESS RIGHT  12/03/2018  . IR US GUIDE VASC ACCESS RIGHT  10/14/2019  . LIGATION OF COMPETING BRANCHES OF ARTERIOVENOUS FISTULA Right 11/03/2014   Procedure: LIGATION OF COMPETING BRANCHE x1 OF RIGHT UPPER ARM ARTERIOVENOUS FISTULA;  Surgeon: Mal Misty, MD;  [facility];  Service: Vascular;  Laterality: Right;  . PERIPHERAL VASCULAR BALLOON ANGIOPLASTY Right 02/04/2017   Procedure: PERIPHERAL VASCULAR BALLOON ANGIOPLASTY;  Surgeon: Waynetta Sandy, MD;  Location: [place] CV LAB;  Service: Cardiovascular;  Laterality: Right;  . PERIPHERAL VASCULAR BALLOON ANGIOPLASTY Right 11/18/2019   Procedure: PERIPHERAL VASCULAR BALLOON ANGIOPLASTY;  Surgeon:  [name], MD;  Location: [place] CV LAB;  Service: Cardiovascular;  Laterality: Right;  anterior tibial  . PERIPHERAL VASCULAR BALLOON ANGIOPLASTY  02/23/2020   Procedure: PERIPHERAL VASCULAR BALLOON ANGIOPLASTY;  Surgeon: [name], MD;  Location: [place] [facility];  Service: Cardiovascular;;  Lt  AT  . TRANSMETATARSAL AMPUTATION Left 02/24/2020   Procedure: TRANSMETATARSAL AMPUTATION LEFT;  Surgeon: Cherre Robins, MD;  Location: [place];  Service: Vascular;  Laterality: Left;  . TUBAL LIGATION      Family History  Problem Relation Age of Onset  . Diabetes Mother   . Hypertension Mother   . Cancer Father   . Diabetes Father   . Hypertension Sister   . Diabetes Daughter   . Heart disease Daughter   . Hypertension Daughter   . Diabetes Son   . Heart disease Son   . Hypertension Son   . Peripheral vascular disease Son        amputation    Social History   Socioeconomic History  . Marital status: Widowed    Spouse name: Not on file  . Number of children: Not on file  . Years of education: Not on file  . Highest education level: Not on file  Occupational History  . Not on file  Tobacco Use  . Smoking status: Former Smoker    Types: Cigarettes    Quit date: 07/25/1984    Years since quitting: 35.8  . Smokeless tobacco: Never Used  Vaping Use  . Vaping Use: Never used  Substance and Sexual Activity  . Alcohol use: No    Alcohol/week: 0.0 standard drinks  . Drug use: No  . Sexual activity: Not on file  Other Topics Concern  . Not on file  Social History Narrative  . Not on file   Social Determinants of Health   Financial Resource Strain: Not on file  Food Insecurity: Not on file  Transportation Needs: Not on file  Physical Activity: Not on file  Stress: Not on file  Social Connections: Not on file  Intimate Partner Violence: Not on file    Allergies  Allergen Reactions  . Sulfa Antibiotics Swelling    Current  Facility-Administered Medications  Medication Dose Route Frequency Provider Last Rate Last Admin  . 0.9 %  sodium chloride infusion   Intravenous Continuous Angelia Mould, MD      . ceFAZolin (ANCEF) IVPB 2g/100 mL premix  2 g Intravenous 30 min Pre-Op Angelia Mould, MD      . Chlorhexidine Gluconate Cloth 2 % PADS 6 each  6 each Topical Once Angelia Mould, MD       And  . Chlorhexidine Gluconate Cloth 2 % PADS 6 each  6 each Topical Once Angelia Mould, MD       Facility-Administered Medications Ordered in Other Encounters  Medication Dose Route Frequency Provider Last Rate Last Admin  . 0.9 %  sodium chloride infusion   Intravenous Continuous PRN Claris Che, CRNA   New Bag at 06/08/20 [phone]    REVIEW OF SYSTEMS:  '[X]'$  denotes positive finding, '[ ]'$  denotes negative  finding Cardiac  Comments:  Chest pain or chest pressure:    Shortness of breath upon exertion:    Short of breath when lying flat:    Irregular heart rhythm:        Vascular    Pain in calf, thigh, or hip brought on by ambulation:    Pain in feet at night that wakes you up from your sleep:     Blood clot in your veins:    Leg swelling:         Pulmonary    Oxygen at home:    Productive cough:     Wheezing:         Neurologic    Sudden weakness in arms or legs:     Sudden numbness in arms or legs:     Sudden onset of difficulty speaking or slurred speech:    Temporary loss of vision in one eye:     Problems with dizziness:         Gastrointestinal    Blood in stool:     Vomited blood:         Genitourinary    Burning when urinating:     Blood in urine:        Psychiatric    Major depression:         Hematologic    Bleeding problems:    Problems with blood clotting too easily:        Skin    Rashes or ulcers:        Constitutional    Fever or chills:     PHYSICAL EXAM:   Vitals:   06/08/20 0632  BP: (!) 116/45  Pulse: 78  Resp: 18  Temp: 98.1 F (36.7  C)  TempSrc: Oral  SpO2: 100%  Weight: 106.6 kg  Height: '5\' 4"'$  (1.626 m)   Constitutional: well appearing in no distress. Obese.  Neurologic: CN intact. No focal findings. No sensory loss. Psychiatric: Mood and affect symmetric and appropriate. Eyes: No icterus. No conjunctival pallor. Ears, nose, throat: mucous membranes moist. Midline trachea.  Cardiac: regular rate and rhythm.  Respiratory: unlabored. Abdominal: soft, non-tender, non-distended.  Peripheral vascular:  Gangrene of L TMA stump Extremity: No edema. No cyanosis. No pallor.  Skin: No gangrene. No ulceration.  Lymphatic: No Stemmer's sign. No palpable lymphadenopathy.   DATA REVIEW:    Most recent CBC CBC Latest Ref Rng & Units 06/08/2020 04/06/2020 03/09/2020  WBC 4.0 - 10.5 K/uL 17.1(H) 15.9(H) 18.0(H)  Hemoglobin 12.0 - 15.0 g/dL 9.4(L) 9.6(L) 10.2(L)  Hematocrit 36.0 - 46.0 % 31.7(L) 31.5(L) 32.1(L)  Platelets 150 - 400 K/uL 646(H) 701(H) 774(H)     Most recent CMP CMP Latest Ref Rng & Units 04/06/2020 03/09/2020 02/26/2020  Glucose 65 - 99 mg/dL 81 56(L) 136(H)  BUN 8 - 27 mg/dL '17 27 13  '$ Creatinine 0.57 - 1.00 mg/dL 5.87(HH) 6.14(HH) 5.37(H)  Sodium 134 - 144 mmol/L 136 141 137  Potassium 3.5 - 5.2 mmol/L 4.5 5.2 4.1  Chloride 96 - 106 mmol/L 95(L) 96 99  CO2 20 - 29 mmol/L '24 20 26  '$ Calcium 8.7 - 10.3 mg/dL 8.9 8.5(L) 8.1(L)  Total Protein 6.0 - 8.5 g/dL 6.9 - -  Total Bilirubin 0.0 - 1.2 mg/dL 0.4 - -  Alkaline Phos 44 - 121 IU/L 68 - -  AST 0 - 40 IU/L 5 - -  ALT 0 - 32 IU/L 7 - -    Renal function CrCl cannot be  calculated (Patient's most recent lab result is older than the maximum 21 days allowed.).  HB A1C (BAYER DCA - WAIVED) (%)  Date Value  04/06/2020 5.4    LDL Chol Calc (NIH)  Date Value Ref Range Status  04/06/2020 20 0 - 99 mg/dL Final    Yevonne Aline. Stanford Breed, MD [facility] Phone Number: [phone] 06/08/2020 7:22 AM

## 2020-06-08 NOTE — Transfer of Care (Signed)
Immediate Anesthesia Transfer of Care Note  Patient: [name]  Procedure(s) Performed: LEFT BELOW KNEE AMPUTATION (Left Knee)  Patient Location: PACU  Anesthesia Type:GA combined with regional for post-op pain  Level of Consciousness: oriented, drowsy, patient cooperative and responds to stimulation  Airway & Oxygen Therapy: Patient Spontanous Breathing and Patient connected to nasal cannula oxygen  Post-op Assessment: Report given to RN, Post -op Vital signs reviewed and stable and Patient moving all extremities X 4  Post vital signs: Reviewed and stable  Last Vitals:  Vitals Value Taken Time  BP 128/56 06/08/20 0917  Temp 36.2 C 06/08/20 0917  Pulse 70 06/08/20 0918  Resp 17 06/08/20 0918  SpO2 99 % 06/08/20 0918  Vitals shown include unvalidated device data.  Last Pain:  Vitals:   06/08/20 0655  TempSrc:   PainSc: 0-No pain         Complications: No complications documented.

## 2020-06-08 NOTE — Anesthesia Procedure Notes (Addendum)
Anesthesia Regional Block: Popliteal block   Pre-Anesthetic Checklist: ,, timeout performed, Correct Patient, Correct Site, Correct Laterality, Correct Procedure, Correct Position, site marked, Risks and benefits discussed,  Surgical consent,  Pre-op evaluation,  At surgeon's request and post-op pain management  Laterality: Left and Lower  Prep: chloraprep       Needles:  Injection technique: Single-shot  Needle Type: Echogenic Needle     Needle Length: 9cm  Needle Gauge: 21     Additional Needles:   Procedures:,,,, ultrasound used (permanent image in chart),,,,  Narrative:  Start time: 06/08/2020 7:31 AM End time: 06/08/2020 7:35 AM Injection made incrementally with aspirations every 5 mL.  Performed by: Personally  Anesthesiologist: Annye Asa, MD  Additional Notes: Pt identified in Holding room.  Monitors applied. Working IV access confirmed. Sterile prep L lateral knee.  #21ga ECHOgenic Arrow block needle to sciatic nerve at split with US guidance (image would not print or save).  20cc 0.5% Bupivacaine with 1:200k epi with Exparel injected incrementally after negative test dose.  Patient asymptomatic, VSS, no heme aspirated, tolerated well.  Jenita Seashore, MD

## 2020-06-08 NOTE — Op Note (Signed)
DATE OF SERVICE: 06/08/2020  PATIENT:  Christy Zhang  65 y.o. 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  ANESTHESIA:   regional and general  EBL: <80m  BLOOD ADMINISTERED:none  DRAINS: none   LOCAL MEDICATIONS USED:  NONE  SPECIMEN:  Residual left lower limb  COUNTS: confirmed correct.  TOURNIQUET:    Total Tourniquet Time Documented: Thigh (Left) - 5 minutes Total: Thigh (Left) - 5 minutes  PATIENT DISPOSITION:  PACU - hemodynamically stable.   Delay start of Pharmacological VTE agent (>24hrs) due to surgical blood loss or risk of bleeding: no  INDICATION FOR PROCEDURE: Christy SEDGWICKis a 65y.o. female with gangrene of left forefoot s/p endovascular revascularization. She has no options for foot salvage. After careful discussion of risks, benefits, and alternatives the patient was offered left below knee amputation. We specifically discussed high risk of revision and conversion to an above knee amputation. The patient understood and wished to proceed.  OPERATIVE FINDINGS: healthy tissue at BKA margin. Minimal bleeding from AT / Peroneal. PT did have some bleeding.   DESCRIPTION OF PROCEDURE: After identification of the patient in the pre-operative holding area, the patient was transferred to the operating room. The patient was positioned supine on the operating room table. Anesthesia was induced. The left leg was prepped and draped in standard fashion. A surgical pause was performed confirming correct patient, procedure, and operative location.  A sterile tourniquet was placed on the left thigh. The skin of the leg was marked to plan the  anterior incision 10 cm distal to the tibial tuberosity and then the marked out a posterior flap that was one third of the circumference of the calf in length.   I then exsanguinated the leg with a Esmarch bandage and then inflated the pneumatic tourniquet to 250 mm Hg. I made the incisions for the flaps, and then dissected through the subcutaneous tissue, fascia, and muscle anteriorly sharply.  I also similarly developed a thick posterior flap of muscle sharply.  I elevated  the periosteal tissue superiorly so that the tibia was about 2 cm shorter than the anterior skin flap.  I then transected the tibia with a power saw and then took a wedge off the tibia anteriorly with the power saw.  Then I smoothed out the rough edges with a rasp.  In a similar fashion, I cut back the fibula about two centimeters higher than the level of the tibia with a bone cutter.  I then finished releasing the posterior muscle flap with an amputation knife.    At this point, the specimen was passed off the field.  I clamped all visibly bleeding arteries and veins using a combination of suture ligation with Vicryl and electrocautery.  The tourniquet was then deflated at this point. Further bleeding was controlled with electrocautery and suture ligature.  The stump was washed off with sterile normal saline and hemostasis confirmed.  I reapproximated the anterior and posterior fascia  with interrupted stitches of 2-0 Vicryl.  This was completed along the entire length of anterior and posterior fascia until there were no more loose space in the fascial line.  The skin was then reapproximated  with 2-O nylons and staples.  A clean bandage was applied.  Upon completion of the case instrument and sharps counts were confirmed correct. The patient was transferred to the PACU in good condition. I was present for all portions of the procedure.  Yevonne Aline. Stanford Breed, MD [facility] Phone Number: [phone] 06/08/2020 9:27 AM

## 2020-06-08 NOTE — H&P (Signed)
History and Physical    Christy Zhang [id number] DOB: 06/10/1955 DOA: 06/08/2020  Referring MD/NP/PA: Carmelia Roller, MD PCP: Claretta Fraise, MD  Patient coming from: Postop  Chief Complaint: Nonhealing transmetatarsal amputation  I have personally briefly reviewed patient's old medical records in [place]   HPI: Christy Zhang is a 65 y.o. female with medical history significant of ESRD on HD, IDDM, HTN, HLD, PVD, and gangrene requiring prior amputation presents for nonhealing left transmetatarsal amputation.  Patient had been hospitalized in 01/2020 after being found to have gangrene of her left toes.  She underwent angioplasty of the left anterior tibial artery on 02/23/2020, but subsequently underwent transmetatarsal amputation of the left the following day.  Patient had home health coming out to do dressing changes twice a week.  However, when the patient follow back up with vascular surgery it was noted that the wound was not healing appropriately and it was recommended that she have a below-knee amputation.  Surgery had initially been scheduled for 04/11/2020, but patient wanted to have a second opinion with the wound care doctor.  After being evaluated by the wound care specialist patient was advised that it likely would not heal.  Surgery has been postponed after patient developed Covid on 05/02/2020, and then subsequently on 05/16/2020 due to incliment weather.  Today patient underwent below-knee amputation of the left leg with Dr. Stanford Breed with less than 50 mL of blood loss..  Labs prior to the procedure were significant for WBC 17.1, hemoglobin 9.4, platelets 646, BUN 17, creatinine 6.21, and albumin 2.5.  ED Course: As seen above  Review of Systems  Constitutional: Negative for fever and malaise/fatigue.  HENT: Positive for sore throat. Negative for congestion and ear discharge.   Eyes: Negative for photophobia and pain.  Respiratory: Positive for cough. Negative for  hemoptysis and shortness of breath.   Cardiovascular: Negative for chest pain and leg swelling.  Gastrointestinal: Negative for abdominal pain, nausea and vomiting.  Genitourinary: Negative for dysuria and hematuria.  Musculoskeletal: Negative for falls.  Skin: Negative for itching.  Neurological: Negative for focal weakness and loss of consciousness.  Psychiatric/Behavioral: Negative for memory loss and substance abuse.    Past Medical History:  Diagnosis Date  . Anemia   . Arthritis   . Asthma    in the past  . CHF (congestive heart failure) ([place])   . Chronic kidney disease    Renal failure, dialysis on M/W/F  . 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 End stage renal disease ([place])   . End stage renal disease on dialysis ([place])   . Hemodialysis access, arteriovenous graft ([place])   . Hyperlipidemia   . Hypertension   . Neuromuscular disorder [facility])     Past Surgical History:  Procedure Laterality Date  . A/V FISTULAGRAM Right 02/04/2017   Procedure: A/V Fistulagram;  Surgeon: Waynetta Sandy, MD;  Location: [place] [facility];  Service: Cardiovascular;  Laterality: Right;  . ABDOMINAL AORTOGRAM W/LOWER EXTREMITY N/A 11/18/2019   Procedure: ABDOMINAL AORTOGRAM W/LOWER EXTREMITY;  Surgeon: [name], MD;  Location: [place] [facility];  Service: Cardiovascular;  Laterality: N/A;  . ABDOMINAL AORTOGRAM W/LOWER EXTREMITY N/A 02/23/2020   Procedure: ABDOMINAL AORTOGRAM W/LOWER EXTREMITY;  Surgeon: [name], MD;  Location: [place] [facility];  Service: Cardiovascular;  Laterality: N/A;  Bilateral  . AV FISTULA PLACEMENT Right 08/25/2014   Procedure: RIGHT BRACHIOCEPHALIC ARTERIOVENOUS (AV) FISTULA CREATION;  Surgeon: Jeneen Rinks  Rockwell Alexandria, MD;  [facility];  Service: Vascular;  Laterality: Right;  . AV FISTULA PLACEMENT Right 03/18/2019   Procedure: INSERTION OF ARTERIOVENOUS (AV) GORE-TEX GRAFT RIGHT ARM;  Surgeon: Serafina Mitchell, MD;   Location: [place];  Service: Vascular;  Laterality: Right;  . BASCILIC VEIN TRANSPOSITION Right 12/24/2018   Procedure: FIRST STAGE BASILIC VEIN TRANSPOSITION RIGHT UPPER ARM;  Surgeon: Serafina Mitchell, MD;  Location: [place];  Service: Vascular;  Laterality: Right;  . FISTULA SUPERFICIALIZATION Right 11/03/2014   Procedure: RIGHT UPPER ARM FISTULA SUPERFICIALIZATION;  Surgeon: Mal Misty, MD;  Location: [place];  Service: Vascular;  Laterality: Right;  . FISTULOGRAM Right 01/16/2016   Procedure: RIGHT UPPER EXTREMITY ARTERIOVENOUS FISTULOGRAM WITH BALLOON ANGIOPLASTY;  Surgeon: Vickie Epley, MD;  Location: [facility];  Service: Vascular;  Laterality: Right;  . INSERTION OF DIALYSIS CATHETER     X4  . IR FLUORO GUIDE CV LINE RIGHT  12/03/2018  . IR REMOVAL TUN CV CATH W/O FL  07/06/2019  . IR THROMBECTOMY AV FISTULA W/THROMBOLYSIS/PTA INC/SHUNT/IMG RIGHT Right 12/03/2018  . IR THROMBECTOMY AV FISTULA W/THROMBOLYSIS/PTA/STENT INC/SHUNT/IMG RT Right 10/11/2019  . IR US GUIDE VASC ACCESS RIGHT  12/03/2018  . IR US GUIDE VASC ACCESS RIGHT  12/03/2018  . IR US GUIDE VASC ACCESS RIGHT  10/14/2019  . LIGATION OF COMPETING BRANCHES OF ARTERIOVENOUS FISTULA Right 11/03/2014   Procedure: LIGATION OF COMPETING BRANCHE x1 OF RIGHT UPPER ARM ARTERIOVENOUS FISTULA;  Surgeon: Mal Misty, MD;  Location: [place];  Service: Vascular;  Laterality: Right;  . PERIPHERAL VASCULAR BALLOON ANGIOPLASTY Right 02/04/2017   Procedure: PERIPHERAL VASCULAR BALLOON ANGIOPLASTY;  Surgeon: Waynetta Sandy, MD;  Location: [place] [facility];  Service: Cardiovascular;  Laterality: Right;  . PERIPHERAL VASCULAR BALLOON ANGIOPLASTY Right 11/18/2019   Procedure: PERIPHERAL VASCULAR BALLOON ANGIOPLASTY;  Surgeon: [name], MD;  Location: [place] [facility];  Service: Cardiovascular;  Laterality: Right;  anterior tibial  . PERIPHERAL VASCULAR BALLOON ANGIOPLASTY  02/23/2020   Procedure: PERIPHERAL VASCULAR BALLOON ANGIOPLASTY;   Surgeon: [name], MD;  Location: [place] [facility];  Service: Cardiovascular;;  Lt  AT  . TRANSMETATARSAL AMPUTATION Left 02/24/2020   Procedure: TRANSMETATARSAL AMPUTATION LEFT;  Surgeon: Cherre Robins, MD;  Location: [place];  Service: Vascular;  Laterality: Left;  . TUBAL LIGATION       reports that she quit smoking about 35 years ago. Her smoking use included cigarettes. She has never used smokeless tobacco. She reports that she does not drink alcohol and does not use drugs.  Allergies  Allergen Reactions  . Sulfa Antibiotics Swelling    Family History  Problem Relation Age of Onset  . Diabetes Mother   . Hypertension Mother   . Cancer Father   . Diabetes Father   . Hypertension Sister   . Diabetes Daughter   . Heart disease Daughter   . Hypertension Daughter   . Diabetes Son   . Heart disease Son   . Hypertension Son   . Peripheral vascular disease Son        amputation    Prior to Admission medications   Medication Sig Start Date End Date Taking? Authorizing Provider  acetaminophen (TYLENOL) 325 MG tablet Take 2 tablets (650 mg total) by mouth every 6 (six) hours as needed for mild pain (or Fever >/= 101). 10/15/19  Yes Emokpae, Courage, MD  amLODipine (NORVASC) 10 MG tablet Take 1 tablet (10 mg total) by  mouth at bedtime. 04/09/20 05/09/20 Yes Claretta Fraise, MD  aspirin EC 81 MG tablet Take 81 mg by mouth at bedtime. Swallow whole.   Yes [provider]  atorvastatin (LIPITOR) 40 MG tablet Take 1 tablet (40 mg total) by mouth daily. Patient taking differently: Take 40 mg by mouth at bedtime. 08/17/19  Yes Claretta Fraise, MD  clopidogrel (PLAVIX) 75 MG tablet Take 1 tablet (75 mg total) by mouth daily. 11/18/19 11/17/20 Yes [name], MD  diphenhydrAMINE (BENADRYL) 25 MG tablet Take 25 mg by mouth every 6 (six) hours as needed for itching.   Yes [provider]  Dulaglutide (TRULICITY) 1.5 0000000 SOPN Inject 1.5 mg into the skin  once a week. Saturday 04/09/20  Yes [name], [name], MD  Insulin Glargine (TOUJEO MAX SOLOSTAR [place]) Inject 100 Units into the skin daily.   Yes [provider]  metoprolol succinate (TOPROL-XL) 100 MG 24 hr tablet Take 1 tablet (100 mg total) by mouth daily. TAKE ONE TABLET BY MOUTH DAILY. TAKE WITH OR IMMEDIATELY FOLLOWING A MEAL. Patient taking differently: Take 100 mg by mouth daily with supper. TAKE WITH OR IMMEDIATELY FOLLOWING A MEAL. 04/09/20  Yes [name], [name], MD  oxyCODONE-acetaminophen (PERCOCET) 5-325 MG tablet Take 1 tablet by mouth every 8 (eight) hours as needed for severe pain. 03/20/20 03/20/21 Yes Ulyses Amor, PA-C  sevelamer carbonate (RENVELA) 800 MG tablet Take 1,600-2,400 mg by mouth See admin instructions. Take 2400 mg with each meal and 1600 mg with each snack   Yes [provider]  Insulin Degludec (TRESIBA) 100 UNIT/ML SOLN Inject 100 Units into the skin daily. Patient not taking: No sig reported 05/15/20   Claretta Fraise, MD  multivitamin (RENA-VIT) TABS tablet Take 1 tablet by mouth at bedtime.     [provider]  oxyCODONE-acetaminophen (PERCOCET) 5-325 MG tablet Take 1 tablet by mouth every 8 (eight) hours as needed for up to 10 doses for severe pain. 02/27/20   Darliss Cheney, MD    Physical Exam:  Constitutional: Female currently in NAD, calm, comfortable Vitals:   06/08/20 1020 06/08/20 1033 06/08/20 1040 06/08/20 1103  BP: (!) 128/55 (!) 122/59  (!) 125/56  Pulse: 70 67 (!) 48 66  Resp: '13 14 17 13  '$ Temp:      TempSrc:      SpO2: 93% 96% 100% 95%  Weight:      Height:       Eyes: PERRL, lids and conjunctivae normal ENMT: Mucous membranes are moist. Posterior pharynx clear of any exudate or lesions.  Neck: normal, supple, no masses, no thyromegaly Respiratory: clear to auscultation bilaterally, no wheezing, no crackles. Normal respiratory effort. No accessory muscle use.  Cardiovascular: Regular rate and rhythm, no murmurs /  rubs / gallops. No extremity edema. 2+ pedal pulses. No carotid bruits.  Abdomen: no tenderness, no masses palpated. No hepatosplenomegaly. Bowel sounds positive.  Musculoskeletal: no clubbing / cyanosis.  Left BKA.  Skin: no rashes, lesions, ulcers. No induration Neurologic: CN 2-12 grossly intact. Sensation intact, DTR normal. Strength 5/5 in all 4.  Psychiatric: Normal judgment and insight. Alert and oriented x 3. Normal mood.     Labs on Admission: I have personally reviewed following labs and imaging studies  CBC: Recent Labs  Lab 06/08/20 0550  WBC 17.1*  HGB 9.4*  HCT 31.7*  MCV 83.6  PLT [id number]*   Basic Metabolic Panel: Recent Labs  Lab 06/08/20 0550  NA 137  K 3.8  CL 96*  CO2 27  GLUCOSE 99  BUN 17  CREATININE 6.21*  CALCIUM 8.3*   GFR: Estimated Creatinine Clearance: 11.1 mL/min (A) (by C-G formula based on SCr of 6.21 mg/dL (H)). Liver Function Tests: Recent Labs  Lab 06/08/20 0550  AST 10*  ALT 11  ALKPHOS 68  BILITOT 0.6  PROT 7.8  ALBUMIN 2.5*   No results for input(s): LIPASE, AMYLASE in the last 168 hours. No results for input(s): AMMONIA in the last 168 hours. Coagulation Profile: Recent Labs  Lab 06/08/20 0550  INR 1.2   Cardiac Enzymes: No results for input(s): CKTOTAL, CKMB, CKMBINDEX, TROPONINI in the last 168 hours. BNP (last 3 results) No results for input(s): PROBNP in the last 8760 hours. HbA1C: No results for input(s): HGBA1C in the last 72 hours. CBG: Recent Labs  Lab 06/08/20 0630 06/08/20 0917  GLUCAP 90 107*   Lipid Profile: No results for input(s): CHOL, HDL, LDLCALC, TRIG, CHOLHDL, LDLDIRECT in the last 72 hours. Thyroid Function Tests: No results for input(s): TSH, T4TOTAL, FREET4, T3FREE, THYROIDAB in the last 72 hours. Anemia Panel: No results for input(s): VITAMINB12, FOLATE, FERRITIN, TIBC, IRON, RETICCTPCT in the last 72 hours. Urine analysis: No results found for: COLORURINE, APPEARANCEUR, LABSPEC,  PHURINE, GLUCOSEU, HGBUR, BILIRUBINUR, KETONESUR, PROTEINUR, UROBILINOGEN, NITRITE, LEUKOCYTESUR Sepsis Labs: No results found for this or any previous visit (from the past 240 hour(s)).   Radiological Exams on Admission: No results found.    Assessment/Plan Gangrene of the left forefoot S/P left below-knee amputation: Presented with a nonhealing transmetatarsal amputation of the left foot with gangrene.  Left below-knee amputation performed by Dr. Stanford Breed of vascular today.  Patient had been treated preop with cefazolin. -Admit to a medical telemetry bed -Oxycodone as needed for pain -Postop care per vascular -Transitions of care consult  Leukocytosis: WBC elevated at 17.1, but suspect secondary to above. -Continue to monitor  ESRD on HD: Patient normally dialyzes Monday, Wednesday, and Friday.  She had received hemodialysis yesterday.  She denies any complaints of shortness of breath and O2 saturation maintained on room air. -Nephrology consulted, will follow-up for further recommendation  Essential hypertension: On admission blood pressures 116/45-128/56.  Home blood pressure medications include metoprolol succinate 100 mg daily and amlodipine 10 mg nightly. -Held restarting metoprolol due to heart rates being in the 40s and pressures -Will resume amlodipine 10 mg daily -Continue to monitor and start metoprolol medically for Anemia of chronic disease: Preop hemoglobin was noted to be 9.4 g/dL which appears similar to the prior day.  Estimated blood loss less than 50 mL -Recheck H&H -Continue to monitor  Diabetes mellitus type 2, controlled: On admission glucose 99.  Patient admits that at home she intermittently snacks and drinks sodas.  Denied having any low blood sugars at home.  Last available hemoglobin A1c was 6.4 on 12/29/2019 Home regimen includes glargine 100 units daily and Trulicity 1.5 mg weekly. -Hypoglycemic protocol -Check hemoglobin A1c -Reduce home Lantus 20 unit  units daily while in the hospital given more controlled diet  PVD -Continue Plavix, aspirin and statin  Hyperlipidemia -Continue home atorvastatin 40 mg nightly  Hypoalbuminemia: Acute.  Albumin noted 2.5 on admission. -Check prealbumin in a.m  Morbid obesity BMI 40.34 kg/m   DVT prophylaxis: heparin Code Status: full  Family Communication: Daughter updated over the phone Disposition Plan: To be determined Consults called: Vascular surgery and nephrology Admission status: Inpatient, require more than 2 midnight stay due to below-knee amputation  Norval Morton MD [facility]  If 7PM-7AM, please contact night-coverage   06/08/2020, 11:07 AM

## 2020-06-08 NOTE — Anesthesia Procedure Notes (Signed)
Anesthesia Regional Block: Adductor canal block   Pre-Anesthetic Checklist: ,, timeout performed, Correct Patient, Correct Site, Correct Laterality, Correct Procedure, Correct Position, site marked, Risks and benefits discussed,  Surgical consent,  Pre-op evaluation,  At surgeon's request and post-op pain management  Laterality: Left and Lower  Prep: chloraprep       Needles:  Injection technique: Single-shot  Needle Type: Echogenic Needle     Needle Length: 9cm  Needle Gauge: 21     Additional Needles:   Procedures:,,,, ultrasound used (permanent image in chart),,,,  Narrative:  Start time: 06/08/2020 7:24 AM End time: 06/08/2020 7:30 AM Injection made incrementally with aspirations every 5 mL.  Performed by: Personally  Anesthesiologist: Annye Asa, MD  Additional Notes: Pt identified in Holding room.  Monitors applied. Working IV access confirmed. Sterile prep L thigh.  #21ga ECHOgenic Arrow block needle into adductor canal with US guidance (picture would not take).  10cc 0.5% Bupivacaine with 1:200k epi, Exparel injected incrementally after negative test dose.  Patient asymptomatic, VSS, no heme aspirated, tolerated well.  Jenita Seashore, MD

## 2020-06-08 NOTE — Anesthesia Postprocedure Evaluation (Signed)
Anesthesia Post Note  Patient: NATISHIA GELBER  Procedure(s) Performed: LEFT BELOW KNEE AMPUTATION (Left Knee)     Patient location during evaluation: PACU Anesthesia Type: General Level of consciousness: awake and alert, patient cooperative and oriented Pain management: pain level controlled Vital Signs Assessment: post-procedure vital signs reviewed and stable Respiratory status: spontaneous breathing, nonlabored ventilation and respiratory function stable Cardiovascular status: blood pressure returned to baseline and stable Postop Assessment: no apparent nausea or vomiting Anesthetic complications: no   No complications documented.  Last Vitals:  Vitals:   06/08/20 1103 06/08/20 1117  BP: (!) 125/56   Pulse: 66 66  Resp: 13 14  Temp:    SpO2: 95% 94%    Last Pain:  Vitals:   06/08/20 1117  TempSrc:   PainSc: Asleep                 Tyquarius Paglia,E. Marcelina Mclaurin

## 2020-06-08 NOTE — Progress Notes (Signed)
Pt arrived to unit accompanied by PACU staff, Pt is alert/oriented in no apparent distress. Situated/oreintated to room/equipments. Welcome guide/menu provided with instructions/Pt verbalized understanding of instructions. Hospital's valuables policy has been discussed. No complaints.  Bed in lowest position with 3 side rails up, call bell/room phone within reach and all wheels locked.

## 2020-06-09 ENCOUNTER — Encounter (HOSPITAL_COMMUNITY): Payer: Self-pay | Admitting: Vascular Surgery

## 2020-06-09 ENCOUNTER — Ambulatory Visit (INDEPENDENT_AMBULATORY_CARE_PROVIDER_SITE_OTHER): Payer: Medicare Other | Admitting: Licensed Clinical Social Worker

## 2020-06-09 DIAGNOSIS — J4530 Mild persistent asthma, uncomplicated: Secondary | ICD-10-CM | POA: Diagnosis not present

## 2020-06-09 DIAGNOSIS — N2889 Other specified disorders of kidney and ureter: Secondary | ICD-10-CM | POA: Diagnosis not present

## 2020-06-09 DIAGNOSIS — N186 End stage renal disease: Secondary | ICD-10-CM

## 2020-06-09 DIAGNOSIS — I151 Hypertension secondary to other renal disorders: Secondary | ICD-10-CM

## 2020-06-09 LAB — CBC
HCT: 29.2 % — ABNORMAL LOW (ref 36.0–46.0)
Hemoglobin: 8.7 g/dL — ABNORMAL LOW (ref 12.0–15.0)
MCH: 24.5 pg — ABNORMAL LOW (ref 26.0–34.0)
MCHC: 29.8 g/dL — ABNORMAL LOW (ref 30.0–36.0)
MCV: 82.3 fL (ref 80.0–100.0)
Platelets: 631 10*3/uL — ABNORMAL HIGH (ref 150–400)
RBC: 3.55 MIL/uL — ABNORMAL LOW (ref 3.87–5.11)
RDW: 17.3 % — ABNORMAL HIGH (ref 11.5–15.5)
WBC: 22.2 10*3/uL — ABNORMAL HIGH (ref 4.0–10.5)
nRBC: 0 % (ref 0.0–0.2)

## 2020-06-09 LAB — GLUCOSE, CAPILLARY
Glucose-Capillary: 128 mg/dL — ABNORMAL HIGH (ref 70–99)
Glucose-Capillary: 146 mg/dL — ABNORMAL HIGH (ref 70–99)
Glucose-Capillary: 144 mg/dL — ABNORMAL HIGH (ref 70–99)

## 2020-06-09 LAB — RENAL FUNCTION PANEL
Albumin: 2.2 g/dL — ABNORMAL LOW (ref 3.5–5.0)
Anion gap: 13 (ref 5–15)
BUN: 25 mg/dL — ABNORMAL HIGH (ref 8–23)
CO2: 28 mmol/L (ref 22–32)
Calcium: 8.2 mg/dL — ABNORMAL LOW (ref 8.9–10.3)
Chloride: 93 mmol/L — ABNORMAL LOW (ref 98–111)
Creatinine, Ser: 7.63 mg/dL — ABNORMAL HIGH (ref 0.44–1.00)
GFR, Estimated: 6 mL/min — ABNORMAL LOW (ref >60)
Glucose, Bld: 148 mg/dL — ABNORMAL HIGH (ref 70–99)
Phosphorus: 6.1 mg/dL — ABNORMAL HIGH (ref 2.5–4.6)
Potassium: 4.3 mmol/L (ref 3.5–5.1)
Sodium: 134 mmol/L — ABNORMAL LOW (ref 135–145)

## 2020-06-09 LAB — HEPATITIS B CORE ANTIBODY, TOTAL: Hep B Core Total Ab: NONREACTIVE

## 2020-06-09 LAB — HEPATITIS B SURFACE ANTIGEN: Hepatitis B Surface Ag: NONREACTIVE

## 2020-06-09 MED ORDER — ACETAMINOPHEN 325 MG PO TABS
ORAL_TABLET | ORAL | Status: AC
  Filled 2020-06-09: qty 2

## 2020-06-09 MED ORDER — CEFAZOLIN SODIUM-DEXTROSE 1-4 GM/50ML-% IV SOLN
1.0000 g | INTRAVENOUS | Status: AC
  Administered 2020-06-10 – 2020-06-11 (×2): 1 g via INTRAVENOUS
  Filled 2020-06-09 (×2): qty 50

## 2020-06-09 MED ORDER — CEFAZOLIN SODIUM-DEXTROSE 1-4 GM/50ML-% IV SOLN
1.0000 g | Freq: Two times a day (BID) | INTRAVENOUS | Status: DC
  Administered 2020-06-09: 1 g via INTRAVENOUS
  Filled 2020-06-09: qty 50

## 2020-06-09 NOTE — Progress Notes (Signed)
PHARMACY NOTE:  ANTIMICROBIAL RENAL DOSAGE ADJUSTMENT  Current antimicrobial regimen includes a mismatch between antimicrobial dosage and estimated renal function.  As per policy approved by the Pharmacy & Therapeutics and Medical Executive Committees, the antimicrobial dosage will be adjusted accordingly.  Current antimicrobial dosage:  Cefazolin 1g Q12 hr x 6 doses  Indication: surgical ppx  Renal Function:  Estimated Creatinine Clearance: 8.8 mL/min (A) (by C-G formula based on SCr of 7.63 mg/dL (H)).     Antimicrobial dosage has been changed to:  Cefazolin 1g Q24hr x3 doses      Thank you for allowing pharmacy to be a part of this patient's care.  Benetta Spar, PharmD, BCPS, BCCP Clinical Pharmacist  Please check AMION for all [place] phone numbers After 10:00 PM, call [place] [phone]

## 2020-06-09 NOTE — Patient Instructions (Addendum)
Licensed Clinical Social Worker Visit Information  Goals we discussed today:  .  Client will talk with Christy Zhang in next 30 days about managing health issues of client and completion daily activities (pt-stated)         CARE PLAN ENTRY   Current Barriers:   Diabetes management in client with chronic diagnoses of Asthma, HTN, ESRD, Anemia ,Obesity  Mobility issues (uses a walker to walk)  Clinical Social Work Clinical Goal(s):   Christy Zhang will call client in next 30 days to discuss client management of health issues and client completion of daily activities  Interventions:  Talked with Christy Zhang, daughter of client, about client needs Talked with [facility] about recent surgery of client Talked with [facility] about client surgery on her leg on 06/08/2020 Talked with [facility] about mood of client Talked with [facility] about appetite of client Talked with [facility] about client plans when leaving hospital [facility] said she is still working with discharge planner from hospital to determine location of patient discharge (SNF versus home with support) Talked with [facility] about CCM program services Talked with [name] about RNCM support with CCM program Talked with [facility] about dialysis treatment for client (goes to dialysis 3 days weekly, goes to dialysis Monday, Wednesday and Friday) Collaborated with [facility] about nursing needs of client  Patient Self Care Activities:   Attends scheduled medical appointments Eats meals independently Attends Dialysis treatments weekly as scheduled  Patient Self Care Deficits:  Diabetes management     Mobility issues  Initial goal documentation       Follow Up Plan:Christy Zhang to call clientor daughter Christy Zhang next 4 weeks to talk about clientmanagement of health issues and to talk withclient or [facility] about clientcompletion of daily activities  Materials Provided: No  The patient Christy Zhang, daughter of patient, verbalized understanding of  instructions provided today and declined a print copy of patient instruction materials.   Christy Zhang MSW, Christy Zhang Licensed Clinical Social Worker [facility] Care Management [phone]

## 2020-06-09 NOTE — Progress Notes (Signed)
[facility]  Subjective  - On/off shooting pain in the left LE.   Objective (!) 128/57 75 97.9 F (36.6 C) (Oral) 18 98%  Intake/Output Summary (Last 24 hours) at 06/09/2020 1033 Last data filed at 06/08/2020 1700 Gross per 24 hour  Intake 480 ml  Output --  Net 480 ml    Left BKA dressing with knee e mobilizer on.  Dressing is clean and dry. Lungs non labored breathing On HD  Assessment/Planning: POD # 1 left BKA  Plan to change dressing tomorrow for incision check.   Roxy Horseman 06/09/2020 10:33 AM --  Laboratory Lab Results: Recent Labs    06/08/20 0550 06/08/20 0812 06/08/20 1240 06/09/20 0151  WBC 17.1*  --   --  22.2*  HGB 9.4*   < > 9.5* 8.7*  HCT 31.7*   < > 31.4* 29.2*  PLT 646*  --   --  631*   < > = values in this interval not displayed.   BMET Recent Labs    06/08/20 0550 06/08/20 0812 06/09/20 0151  NA 137 137 134*  K 3.8 3.8 4.3  CL 96* 97* 93*  CO2 27  --  28  GLUCOSE 99 153* 148*  BUN 17 22 25*  CREATININE 6.21* 5.40* 7.63*  CALCIUM 8.3*  --  8.2*    COAG Lab Results  Component Value Date   INR 1.2 06/08/2020   No results found for: PTT

## 2020-06-09 NOTE — Chronic Care Management (AMB) (Signed)
Chronic Care Management    Clinical Social Work Follow Up Note  06/09/2020 Name: Christy Zhang MRN: [id number] DOB: 05-May-1955  Christy Zhang is a 65 y.o. year old female who is a primary care patient of [name], [name], MD. The CCM team was consulted for assistance with [name] .   Review of patient status, including review of consultants reports, other relevant assessments, and collaboration with appropriate care team members and the patient's provider was performed as part of comprehensive patient evaluation and provision of chronic care management services.    SDOH (Social Determinants of Health) assessments performed: No; risk for depression; risk for tobacco use; risk for stress; risk for physical inactivity  Flowsheet Row Chronic Care Management from 10/12/2019 in [place]  PHQ-9 Total Score 5      Facility-Administered Encounter Medications as of 06/09/2020  Medication  . acetaminophen (TYLENOL) tablet 650 mg   Or  . acetaminophen (TYLENOL) suppository 650 mg  . amLODipine (NORVASC) tablet 10 mg  . aspirin EC tablet 81 mg  . atorvastatin (LIPITOR) tablet 40 mg  . ceFAZolin (ANCEF) IVPB 1 g/50 mL premix  . Chlorhexidine Gluconate Cloth 2 % PADS 6 each  . clopidogrel (PLAVIX) tablet 75 mg  . diphenhydrAMINE (BENADRYL) capsule 25 mg  . heparin injection 5,000 Units  . insulin aspart (novoLOG) injection 0-15 Units  . insulin glargine (LANTUS) injection 20 Units  . multivitamin (RENA-VIT) tablet 1 tablet  . oxyCODONE-acetaminophen (PERCOCET/ROXICET) 5-325 MG per tablet 1 tablet  . sevelamer carbonate (RENVELA) tablet 1,600 mg  . sevelamer carbonate (RENVELA) tablet 2,400 mg  . sodium chloride flush (NS) 0.9 % injection 3 mL   Outpatient Encounter Medications as of 06/09/2020  Medication Sig  . acetaminophen (TYLENOL) 325 MG tablet Take 2 tablets (650 mg total) by mouth every 6 (six) hours as needed for mild pain (or Fever >/= 101).   Marland Kitchen amLODipine (NORVASC) 10 MG tablet Take 1 tablet (10 mg total) by mouth at bedtime.  Marland Kitchen aspirin EC 81 MG tablet Take 81 mg by mouth at bedtime. Swallow whole.  Marland Kitchen atorvastatin (LIPITOR) 40 MG tablet Take 1 tablet (40 mg total) by mouth daily. (Patient taking differently: Take 40 mg by mouth at bedtime.)  . clopidogrel (PLAVIX) 75 MG tablet Take 1 tablet (75 mg total) by mouth daily.  . diphenhydrAMINE (BENADRYL) 25 MG tablet Take 25 mg by mouth every 6 (six) hours as needed for itching.  . Dulaglutide (TRULICITY) 1.5 0000000 SOPN Inject 1.5 mg into the skin once a week. Saturday  . Insulin Degludec (TRESIBA) 100 UNIT/ML SOLN Inject 100 Units into the skin daily. (Patient not taking: No sig reported)  . Insulin Glargine (TOUJEO MAX SOLOSTAR [place]) Inject 100 Units into the skin daily.  . metoprolol succinate (TOPROL-XL) 100 MG 24 hr tablet Take 1 tablet (100 mg total) by mouth daily. TAKE ONE TABLET BY MOUTH DAILY. TAKE WITH OR IMMEDIATELY FOLLOWING A MEAL. (Patient taking differently: Take 100 mg by mouth daily with supper. TAKE WITH OR IMMEDIATELY FOLLOWING A MEAL.)  . multivitamin (RENA-VIT) TABS tablet Take 1 tablet by mouth at bedtime.   Marland Kitchen oxyCODONE-acetaminophen (PERCOCET) 5-325 MG tablet Take 1 tablet by mouth every 8 (eight) hours as needed for up to 10 doses for severe pain.  Marland Kitchen oxyCODONE-acetaminophen (PERCOCET) 5-325 MG tablet Take 1 tablet by mouth every 8 (eight) hours as needed for severe pain.  . sevelamer carbonate (RENVELA) 800 MG tablet Take 1,600-2,400 mg by mouth See  admin instructions. Take 2400 mg with each meal and 1600 mg with each snack    Goals    .  Client will talk with LCSW in next 30 days about managing health issues of client and completion daily activities (pt-stated)      CARE PLAN ENTRY   Current Barriers:  . Diabetes management in client with chronic diagnoses of Asthma, HTN, ESRD, Anemia ,Obesity . Mobility issues (uses a walker to walk)  Clinical Social Work  Clinical Goal(s):  Marland Kitchen LCSW will call client in next 30 days to discuss client management of health issues and client completion of daily activities  Interventions:  Talked with Christy Zhang, daughter of client, about client needs Talked with [facility] about recent surgery of client Talked with [facility] about client surgery on her leg on 06/08/2020 Talked with [facility] about mood of client Talked with [facility] about appetite of client Talked with [facility] about client plans when leaving hospital [facility] said she is still working with discharge planner from hospital to determine location of patient discharge (SNF versus home with support) Talked with [facility] about CCM program services Talked with [name] about RNCM support with CCM program Talked with [facility] about dialysis treatment for client (goes to dialysis 3 days weekly, goes to dialysis Monday, Wednesday and Friday) Collaborated with [facility] about nursing needs of client  Patient Self Care Activities:   Attends scheduled medical appointments Eats meals independently Attends Dialysis treatments weekly as scheduled  Patient Self Care Deficits: . Diabetes management     Mobility issues  Initial goal documentation       Follow Up Plan:LCSW to call clientor daughter Christy Zhang next 4 weeks to talk about clientmanagement of health issues and to talk withclient or [name] about clientcompletion of daily activities  Norva Riffle.Sherrise Liberto MSW, LCSW Licensed Clinical Social Worker [facility] Care Management [phone]

## 2020-06-09 NOTE — Progress Notes (Signed)
[facility] Progress Note   Subjective:   Pt seen on HD, tolerating well so far. Reports she had a scratchy throat and heartburn yesterday, now improving. No leg pain this AM.Denies SOB, CP, palpitations, dizziness, and nausea.  Objective Vitals:   06/08/20 1300 06/08/20 1700 06/08/20 1933 06/09/20 0431  BP: 140/68 139/60 (!) 159/63 (!) 138/43  Pulse: 78 79 86 78  Resp: '17 17 17 16  '$ Temp: 97.7 F (36.5 C) 97.7 F (36.5 C) 97.9 F (36.6 C) 97.8 F (36.6 C)  TempSrc: Oral Oral    SpO2: 98% 99% 93% 91%  Weight:      Height:       Physical Exam General: Well developed, alert female in NAD Heart: RRR, no murmurs, rubs or gallops Lungs: CTA bilaterally without wheezing, rhonchi or rales Abdomen: Soft, non-distended, +BS Extremities: L BKA, no edema R LE Dialysis Access: RUE AVG accessed  Additional Objective Labs: Basic Metabolic Panel: Recent Labs  Lab 06/08/20 0550 06/08/20 0812 06/09/20 0151  NA 137 137 134*  K 3.8 3.8 4.3  CL 96* 97* 93*  CO2 27  --  28  GLUCOSE 99 153* 148*  BUN 17 22 25*  CREATININE 6.21* 5.40* 7.63*  CALCIUM 8.3*  --  8.2*  PHOS  --   --  6.1*   Liver Function Tests: Recent Labs  Lab 06/08/20 0550 06/09/20 0151  AST 10*  --   ALT 11  --   ALKPHOS 68  --   BILITOT 0.6  --   PROT 7.8  --   ALBUMIN 2.5* 2.2*   CBC: Recent Labs  Lab 06/08/20 0550 06/08/20 0812 06/08/20 1240 06/09/20 0151  WBC 17.1*  --   --  22.2*  HGB 9.4* 10.5* 9.5* 8.7*  HCT 31.7* 31.0* 31.4* 29.2*  MCV 83.6  --   --  82.3  PLT 646*  --   --  631*   Blood Culture    Component Value Date/Time   SDES BLOOD RIGHT ARM 02/22/2020 1300   SPECREQUEST  02/22/2020 1300    BOTTLES DRAWN AEROBIC AND ANAEROBIC Blood Culture results may not be optimal due to an inadequate volume of blood received in culture bottles   CULT  02/22/2020 1300    NO GROWTH 5 DAYS Performed at [facility], [address] [place], [place] [address]    REPTSTATUS  02/27/2020 FINAL 02/22/2020 1300   CBG: Recent Labs  Lab 06/08/20 0630 06/08/20 0917 06/08/20 1153 06/08/20 1629 06/08/20 1932  GLUCAP 90 107* 120* 183* 160*   Medications: .  ceFAZolin (ANCEF) IV     . amLODipine  10 mg Oral QHS  . aspirin EC  81 mg Oral QHS  . atorvastatin  40 mg Oral QHS  . Chlorhexidine Gluconate Cloth  6 each Topical Q0600  . clopidogrel  75 mg Oral Daily  . heparin  5,000 Units Subcutaneous Q8H  . insulin aspart  0-15 Units Subcutaneous TID WC  . insulin glargine  20 Units Subcutaneous Daily  . multivitamin  1 tablet Oral QHS  . sevelamer carbonate  2,400 mg Oral TID WC  . sodium chloride flush  3 mL Intravenous Q12H    Outpatient Dialysis Orders: [facility]  Assessment/Plan: 1. 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 ESRD -K+ controlled, no symptoms of uremia. Continue dialysis on MWF schedule.  3. HTN/ volume - No significant volume overload on exam. BP adequately controlled. Continue amlodipine.  UF goal 2L with HD today.  4. DM2 - per admitting team. 5. Anemia ckd - Hgb down to 8.7 post op. Will request outpatient ESA records.  6. MBD ckd - Calcium controlled, phos slightly high. Continue renvela and follow.    Anice Paganini, PA-C 06/09/2020, 8:22 AM  [facility] Pager: [phone]

## 2020-06-09 NOTE — Progress Notes (Signed)
Pt left unit approximated at 0720 for HD tx.

## 2020-06-09 NOTE — Evaluation (Signed)
Physical Therapy Evaluation Patient Details Name: Christy Zhang MRN: [id number] DOB: March 21, 1956 Today's Date: 06/09/2020   History of Present Illness  Patient had been hospitalized in 01/2020 after being found to have gangrene of her left toes.  She underwent angioplasty of the left anterior tibial artery on 02/23/2020, but subsequently underwent transmetatarsal amputation of the left the following day.  Patient had home health coming out to do dressing changes twice a week.  However, when the patient follow back up with vascular surgery it was noted that the wound was not healing appropriately and it was recommended that she have a below-knee amputation.  Surgery had initially been scheduled for 04/11/2020, but patient wanted to have a second opinion with the wound care doctor.  After being evaluated by the wound care specialist patient was advised that it likely would not heal.  Surgery has been postponed after patient developed Covid on 05/02/2020, and then subsequently on 05/16/2020 due to incliment weather. On 06/08/2020 the  patient underwent below-knee amputation of the left leg with Dr. Stanford Breed  Clinical Impression  PTA, patient lives with daughter and 2 grandsons and reports primary mobility in w/c and requires assistance for ADLs. Patient presents with generalized weakness, decreased activity tolerance, impaired balance, and impaired functional mobility. Patient overall minA for mobility with RW. Patient will benefit from skilled PT services during acute stay to address listed deficits. Recommend CIR following d/c for intensive therapies to assist with maximizing functional independence.     Follow Up Recommendations CIR    Equipment Recommendations  None recommended by PT    Recommendations for Other Services       Precautions / Restrictions Precautions Precautions: Fall Required Braces or Orthoses: Knee Immobilizer - Left Knee Immobilizer - Left: On at all times Restrictions Weight  Bearing Restrictions: Yes LLE Weight Bearing: Non weight bearing      Mobility  Bed Mobility Overal bed mobility: Needs Assistance Bed Mobility: Supine to Sit     Supine to sit: Min assist     General bed mobility comments: min A to pull self up, could have used rail    Transfers Overall transfer level: Needs assistance Equipment used: Rolling Grecia Lynk (2 wheeled) Transfers: Sit to/from [facility] Sit to Stand: Min assist Stand pivot transfers: Min assist          Ambulation/Gait                Stairs            Wheelchair Mobility    Modified Rankin (Stroke Patients Only)       Balance Overall balance assessment: Needs assistance Sitting-balance support: Bilateral upper extremity supported Sitting balance-Leahy Scale: Good     Standing balance support: Bilateral upper extremity supported;During functional activity Standing balance-Leahy Scale: Poor Standing balance comment: reliant on UE support                             Pertinent Vitals/Pain Pain Assessment: Faces Faces Pain Scale: Hurts a little bit Pain Location: L LE Pain Descriptors / Indicators: Grimacing Pain Intervention(s): Monitored during session    Home Living Family/patient expects to be discharged to:: Private residence Living Arrangements: Children;Other relatives (dtr and 2 grandsons) Available Help at Discharge: Family Type of Home: House Home Access: Ramped entrance     Home Layout: Two level;Able to live on main level with bedroom/bathroom;Full bath on main level Home Equipment: Florinda Taflinger - 2 wheels;Tub  bench;Hand held shower head;Wheelchair - manual      Prior Function Level of Independence: Needs assistance   Gait / Transfers Assistance Needed: uses w/c for primary mobility  ADL's / Homemaking Assistance Needed: Pt needs help with bra and socks.  Sponge bathes upper body on her own but daughter does the lower body.  Pt does light ADLs  only, pt does some cooking but daughter does most, Can manage meds. Does not go inot bathroom  Comments: Pt wants grab bars in shower     Hand Dominance   Dominant Hand: Right    Extremity/Trunk Assessment   Upper Extremity Assessment Upper Extremity Assessment: Defer to OT evaluation    Lower Extremity Assessment Lower Extremity Assessment: Generalized weakness       Communication   Communication: No difficulties  Cognition Arousal/Alertness: Awake/alert Behavior During Therapy: WFL for tasks assessed/performed Overall Cognitive Status: Within Functional Limits for tasks assessed                                        General Comments      Exercises     Assessment/Plan    PT Assessment Patient needs continued PT services  PT Problem List Decreased strength;Decreased activity tolerance;Decreased balance;Decreased range of motion;Decreased mobility       PT Treatment Interventions DME instruction;Gait training;Functional mobility training;Therapeutic activities;Therapeutic exercise;Balance training;Patient/family education    PT Goals (Current goals can be found in the Care Plan section)  Acute Rehab PT Goals Patient Stated Goal: go home PT Goal Formulation: With patient Time For Goal Achievement: 06/23/20 Potential to Achieve Goals: Good    Frequency Min 3X/week   Barriers to discharge        Co-evaluation PT/OT/SLP Co-Evaluation/Treatment: Yes Reason for Co-Treatment: For patient/therapist safety;To address functional/ADL transfers PT goals addressed during session: Mobility/safety with mobility;Balance;Proper use of DME OT goals addressed during session: ADL's and self-care;Proper use of Adaptive equipment and DME       AM-PAC PT "6 Clicks" Mobility  Outcome Measure Help needed turning from your back to your side while in a flat bed without using bedrails?: A Little Help needed moving from lying on your back to sitting on the side  of a flat bed without using bedrails?: A Little Help needed moving to and from a bed to a chair (including a wheelchair)?: A Little Help needed standing up from a chair using your arms (e.g., wheelchair or bedside chair)?: A Little Help needed to walk in hospital room?: A Little Help needed climbing 3-5 steps with a railing? : A Lot 6 Click Score: 17    End of Session Equipment Utilized During Treatment: Gait belt Activity Tolerance: Patient tolerated treatment well Patient left: in chair;with call bell/phone within reach;with chair alarm set Nurse Communication: Mobility status PT Visit Diagnosis: Unsteadiness on feet (R26.81);Muscle weakness (generalized) (M62.81);Other abnormalities of gait and mobility (R26.89)    Time: [id number] PT Time Calculation (min) (ACUTE ONLY): 23 min   Charges:   PT Evaluation $PT Eval Low Complexity: 1 Low          Orie Baxendale A. Gilford Rile PT, DPT Acute Rehabilitation Services Pager [phone] Office [phone]   Linna Hoff 06/09/2020, 5:33 PM

## 2020-06-09 NOTE — Evaluation (Signed)
Occupational Therapy Evaluation Patient Details Name: Christy Zhang MRN: [id number] DOB: 03-16-1956 Today's Date: 06/09/2020    History of Present Illness Patient had been hospitalized in 01/2020 after being found to have gangrene of her left toes.  She underwent angioplasty of the left anterior tibial artery on 02/23/2020, but subsequently underwent transmetatarsal amputation of the left the following day.  Patient had home health coming out to do dressing changes twice a week.  However, when the patient follow back up with vascular surgery it was noted that the wound was not healing appropriately and it was recommended that she have a below-knee amputation.  Surgery had initially been scheduled for 04/11/2020, but patient wanted to have a second opinion with the wound care doctor.  After being evaluated by the wound care specialist patient was advised that it likely would not heal.  Surgery has been postponed after patient developed Covid on 05/02/2020, and then subsequently on 05/16/2020 due to incliment weather. On 06/08/2020 the  patient underwent below-knee amputation of the left leg with Dr. Stanford Breed   Clinical Impression   Pt presents with decline in function and safety with ADLs and ADL mobility with impaired balance and endurance. Pt live st home with her daughter and 2 grandsons (20 y/o and 12 y/o). Pt was using a RW and w/c for mobility and required min A with LB ADLs/selfcare; states that she doesn't;t go into the bathroom and uses BSC and sponge bathes. Pt currently requires min A to sit EOb, max A with LB ADLs and min A with SPTs. Pt would benefit from acute OT services to address impairments to maximize level of function and safety    Follow Up Recommendations  CIR    Equipment Recommendations  3 in 1 bedside commode;Other (comment) (RW, TBD at next level of care)    Recommendations for Other Services       Precautions / Restrictions Precautions Precautions: Fall Required Braces  or Orthoses: Knee Immobilizer - Left Knee Immobilizer - Left: On at all times Restrictions Weight Bearing Restrictions: Yes LLE Weight Bearing: Non weight bearing      Mobility Bed Mobility Overal bed mobility: Needs Assistance Bed Mobility: Supine to Sit     Supine to sit: Min assist     General bed mobility comments: min A to pull self up, could have used rail    Transfers Overall transfer level: Needs assistance Equipment used: Rolling walker (2 wheeled) Transfers: Sit to/from [facility] Sit to Stand: Min assist Stand pivot transfers: Min assist            Balance Overall balance assessment: Needs assistance Sitting-balance support: Bilateral upper extremity supported Sitting balance-Leahy Scale: Good     Standing balance support: Bilateral upper extremity supported;During functional activity Standing balance-Leahy Scale: Poor                             ADL either performed or assessed with clinical judgement   ADL Overall ADL's : Needs assistance/impaired Eating/Feeding: Set up;Independent;Sitting   Grooming: Wash/dry hands;Wash/dry face;Min guard;Sitting   Upper Body Bathing: Min guard;Sitting   Lower Body Bathing: Sitting/lateral leans;Maximal assistance   Upper Body Dressing : Min guard;Sitting   Lower Body Dressing: Maximal assistance;Sitting/lateral leans   Toilet Transfer: Minimal assistance;Stand-pivot;RW;Cueing for safety Toilet Transfer Details (indicate cue type and reason): simulated to recliner Toileting- Clothing Manipulation and Hygiene: Moderate assistance       Functional mobility during ADLs:  Rolling walker;Minimal assistance;Cueing for safety       Vision Baseline Vision/History: Wears glasses Wears Glasses: Reading only Patient Visual Report: No change from baseline       Perception     Praxis      Pertinent Vitals/Pain Pain Assessment: Faces Faces Pain Scale: Hurts a little bit Pain  Location: L LE Pain Descriptors / Indicators: Grimacing Pain Intervention(s): Monitored during session;Repositioned     Hand Dominance Right   Extremity/Trunk Assessment             Communication Communication Communication: No difficulties   Cognition Arousal/Alertness: Awake/alert Behavior During Therapy: WFL for tasks assessed/performed Overall Cognitive Status: Within Functional Limits for tasks assessed                                     General Comments       Exercises     Shoulder Instructions      Home Living Family/patient expects to be discharged to:: Private residence Living Arrangements: Children;Other relatives (dtr and 2 grandsons) Available Help at Discharge: Family Type of Home: House Home Access: Ramped entrance     Home Layout: Two level;Able to live on main level with bedroom/bathroom;Full bath on main level     Bathroom Shower/Tub: Tub/shower unit;Other (comment)   Bathroom Toilet: Handicapped height     Home Equipment: Walker - 2 wheels;Tub bench;Hand held shower head;Wheelchair - manual          Prior Functioning/Environment Level of Independence: Needs assistance    ADL's / Homemaking Assistance Needed: Pt needs help with bra and socks.  Sponge bathes upper body on her own but daughter does the lower body.  Pt does light ADLs only, pt does some cooking but daughter does most, Can manage meds. Does not go inot bathroom   Comments: Pt wants grab bars in shower        OT Problem List: Impaired balance (sitting and/or standing);Decreased activity tolerance;Decreased knowledge of use of DME or AE;Pain      OT Treatment/Interventions:      OT Goals(Current goals can be found in the care plan section) Acute Rehab OT Goals Patient Stated Goal: go home OT Goal Formulation: With patient Time For Goal Achievement: 06/23/20 Potential to Achieve Goals: Good ADL Goals Pt Will Perform Grooming: with supervision;with  set-up;sitting Pt Will Perform Upper Body Bathing: with supervision;with set-up;sitting Pt Will Perform Lower Body Bathing: with min assist;sitting/lateral leans Pt Will Perform Upper Body Dressing: with supervision;with set-up;sitting Pt Will Transfer to Toilet: with min guard assist;ambulating;stand pivot transfer;bedside commode Pt Will Perform Toileting - Clothing Manipulation and hygiene: with min assist;sitting/lateral leans  OT Frequency: Min 2X/week   Barriers to D/C:            Co-evaluation PT/OT/SLP Co-Evaluation/Treatment: Yes Reason for Co-Treatment: For patient/therapist safety;To address functional/ADL transfers   OT goals addressed during session: ADL's and self-care;Proper use of Adaptive equipment and DME      AM-PAC OT "6 Clicks" Daily Activity     Outcome Measure Help from another person eating meals?: None Help from another person taking care of personal grooming?: A Little Help from another person toileting, which includes using toliet, bedpan, or urinal?: A Lot Help from another person bathing (including washing, rinsing, drying)?: A Lot Help from another person to put on and taking off regular upper body clothing?: A Little Help from another person to put on  and taking off regular lower body clothing?: A Lot 6 Click Score: 16   End of Session Equipment Utilized During Treatment: Gait belt;Rolling walker  Activity Tolerance: Patient tolerated treatment well Patient left: in chair;with call bell/phone within reach;with chair alarm set  OT Visit Diagnosis: Unsteadiness on feet (R26.81);Other abnormalities of gait and mobility (R26.89);Pain Pain - Right/Left: Left Pain - part of body: Leg                Time: [id number] OT Time Calculation (min): 23 min Charges:  OT General Charges $OT Visit: 1 Visit OT Evaluation $OT Eval Moderate Complexity: 1 Mod   Britt Bottom 06/09/2020, 4:19 PM

## 2020-06-09 NOTE — Plan of Care (Signed)
  Problem: Education: Goal: Knowledge of General Education information will improve Description: Including pain rating scale, medication(s)/side effects and non-pharmacologic comfort measures Outcome: Progressing   Problem: Nutrition: Goal: Adequate nutrition will be maintained Outcome: Progressing   Problem: Coping: Goal: Level of anxiety will decrease Outcome: Progressing   

## 2020-06-09 NOTE — Progress Notes (Signed)
PROGRESS NOTE    Patient: Christy Zhang                            PCP: Christy Fraise, MD                    DOB: Sep 14, 1955            DOA: 06/08/2020 [id number]             DOS: 06/09/2020, 10:19 AM   LOS: 1 day   Date of Service: The patient was seen and examined on 06/09/2020  Subjective:   The patient was seen and examined this morning. Hemodynamically stable, currently in hemodialysis -getting hemodialysis Still complaining of left leg pain Otherwise no issues overnight .  Brief Narrative:    Christy Zhang is a 65 y.o. female with medical history significant of ESRD on HD, IDDM, HTN, HLD, PVD, and gangrene requiring prior amputation presents for nonhealing left transmetatarsal amputation.   Patient had been hospitalized in 01/2020 after being found to have gangrene of her left toes.  She underwent angioplasty of the left anterior tibial artery on 02/23/2020, but subsequently underwent transmetatarsal amputation of the left the following day.  Patient had home health coming out to do dressing changes twice a week.  However, when the patient follow back up with vascular surgery it was noted that the wound was not healing appropriately and it was recommended that she have a below-knee amputation.  Surgery had initially been scheduled for 04/11/2020, but patient wanted to have a second opinion with the wound care doctor.  After being evaluated by the wound care specialist patient was advised that it likely would not heal.  Surgery has been postponed after patient developed Covid on 05/02/2020, and then subsequently on 05/16/2020 due to incliment weather.    On 06/08/2020 the  patient underwent below-knee amputation of the left leg with Dr. Stanford Breed with less than 50 mL of blood loss..  Labs prior to the procedure were significant for WBC 17.1, hemoglobin 9.4, platelets 646, BUN 17, creatinine 6.21, and albumin 2.5.   Assessment & Plan:   Principal Problem:   S/P BKA (below knee  amputation) unilateral (HCC) Active Problems:   ESRD (end stage renal disease) ([place])   Controlled type 2 diabetes mellitus with chronic kidney disease on chronic dialysis, with long-term current use of insulin (HCC)   Essential (primary) hypertension   Obesity, Class III, BMI 40-49.9 (morbid obesity) ([place])   Gangrene (HCC)    Assessment/Plan Gangrene of the left forefoot  -  nonhealing transmetatarsal amputation of the left foot with gangrene.   - Left below-knee amputation performed by Dr. Stanford Breed of vascular  - S/P left below-knee amputation: 06/08/2020  -Continue preop  IV ABx Cefazolin .Marland Kitchen Empirically due to leukocytosis -Continue with pain management, oxycodone, as needed IV narcotics -Postop care per vascular -Transitions of care consult  Leukocytosis:  WBC elevated at 17.1 >> 22.2 but suspect secondary to above. -Continue to monitor -Pending blood cultures, continue empiric antibiotic  ESRD on HD:  -Patient normally dialyzes Monday, Wednesday, and Friday.   -Hemodialysis today -Nephrology following closely appreciate input   Essential hypertension:  -Monitoring blood pressure, remained stable -Patient blood pressure remained stable -Continue home meds:  Home blood pressure medications include metoprolol succinate 100 mg daily and amlodipine 10 mg nightly. -We will resume amlodipine 10 mg daily -Patient was briefly bradycardic heart rate in 48  currently 34 anticipating resuming metoprolol  Anemia of chronic disease -with chronic kidney disease  -Monitoring H&H, -  Estimated blood loss less than 50 mL -Hemoglobin 10.5, 9.5, 8.7 -No signs of bleeding -Continue to monitor  Diabetes mellitus type II,  -Seems to be well controlled -Check an A1c 5.8, last A1c 6.4 12/29/2019 -Checking CBG QA CHS, SSI coverage -  Home regimen includes glargine 100 units daily and Trulicity 1.5 mg weekly. -Continue with reduced  home Lantus 20 unit units daily while in the hospital  given more controlled diet  PVD -Continue Plavix, aspirin and statin -Stable  Hyperlipidemia -Continue home atorvastatin 40 mg nightly -Stable  Hypoalbuminemia: Acute.   Albumin noted 2.5, 2.2 o -Monitoring replete as needed  Morbid obesity BMI 40.34 kg/m -Patient has been counseled regarding aggressive weight loss -Dietary education -Increase activity postop  ------------------------------------------------------------------------------------------------------------------------------------ Nutritional status:  The patient's BMI is: Body mass index is 39.81 kg/m. I agree with the assessment and plan as outlined below:  Skin Assessment: I have examined the patient's skin and I agree with the wound assessment as performed by wound care team As outlined ....      ------------------------------------------------------------------------------------------------------------------------------------ Cultures; Blood Cultures x 2  Antimicrobials: 06/08/2020 Ancef >>>   Consultants: Vascular surgery and nephrology  ------------------------------------------------------------------------------------------------------------------------------------------------  DVT prophylaxis:  Heparin SQ Code Status:   Code Status: Full Code  Family Communication: No family member present at bedside- attempt will be made to update daily The above findings and plan of care has been discussed with patient (and family)  in detail,  they expressed understanding and agreement of above. -Advance care planning has been discussed.   Admission status:   Status is: Inpatient  Remains inpatient appropriate because:Inpatient level of care appropriate due to severity of illness   Dispo: The patient is from: Home              Anticipated d/c is to: To be determined              Anticipated d/c date is: 3 days              Patient currently is not medically stable to d/c.   Difficult to place  patient Yes multiple, reviewed, amputation, generalized weakness      Level of care: Telemetry Medical   Procedures:   AMPUTATION BELOW KNEE     Antimicrobials:  Anti-infectives (From admission, onward)   Start     Dose/Rate Route Frequency Ordered Stop   06/09/20 1000  ceFAZolin (ANCEF) IVPB 1 g/50 mL premix        1 g 100 mL/hr over 30 Minutes Intravenous Every 12 hours 06/09/20 0723 06/12/20 0959   06/08/20 0549  ceFAZolin (ANCEF) IVPB 2g/100 mL premix        2 g 200 mL/hr over 30 Minutes Intravenous 30 min pre-op 06/08/20 0549 06/08/20 0809       Medication:  . amLODipine  10 mg Oral QHS  . aspirin EC  81 mg Oral QHS  . atorvastatin  40 mg Oral QHS  . Chlorhexidine Gluconate Cloth  6 each Topical Q0600  . clopidogrel  75 mg Oral Daily  . heparin  5,000 Units Subcutaneous Q8H  . insulin aspart  0-15 Units Subcutaneous TID WC  . insulin glargine  20 Units Subcutaneous Daily  . multivitamin  1 tablet Oral QHS  . sevelamer carbonate  2,400 mg Oral TID WC  . sodium chloride flush  3 mL Intravenous Q12H  acetaminophen **OR** acetaminophen, diphenhydrAMINE, oxyCODONE-acetaminophen, sevelamer carbonate   Objective:   Vitals:   06/09/20 0830 06/09/20 0900 06/09/20 0930 06/09/20 1000  BP: 136/60 (!) 133/58 (!) 119/54 (!) 128/57  Pulse:  67 76 75  Resp:      Temp:      TempSrc:      SpO2:      Weight:      Height:        Intake/Output Summary (Last 24 hours) at 06/09/2020 1019 Last data filed at 06/08/2020 1700 Gross per 24 hour  Intake 480 ml  Output --  Net 480 ml   Filed Weights   06/08/20 [id number] 06/09/20 0740  Weight: 106.6 kg 105.2 kg     Examination:   Physical Exam  Constitution:  Alert, cooperative, no distress,  Appears calm and comfortable  Psychiatric: Normal and stable mood and affect, cognition intact,   HEENT: Normocephalic, PERRL, otherwise with in Normal limits  Chest:Chest symmetric Cardio vascular:  S1/S2, RRR, No murmure, No  Rubs or Gallops  pulmonary: Clear to auscultation bilaterally, respirations unlabored, negative wheezes / crackles Abdomen: Soft, non-tender, non-distended, bowel sounds,no masses, no organomegaly Muscular skeletal: Limited exam - in bed, able to move all 3 extremities,     Neuro: CNII-XII intact. , normal motor and sensation, reflexes intact  Extremities: No pitting edema lower extremities, +2 pulses  Skin: Dry, warm to touch, negative for any Rashes, dressing in place Wounds: per nursing documentation -with surgical wound    ------------------------------------------------------------------------------------------------------------------------------------------    LABs:  CBC Latest Ref Rng & Units 06/09/2020 06/08/2020 06/08/2020  WBC 4.0 - 10.5 K/uL 22.2(H) - -  Hemoglobin 12.0 - 15.0 g/dL 8.7(L) 9.5(L) 10.5(L)  Hematocrit 36.0 - 46.0 % 29.2(L) 31.4(L) 31.0(L)  Platelets 150 - 400 K/uL 631(H) - -   CMP Latest Ref Rng & Units 06/09/2020 06/08/2020 06/08/2020  Glucose 70 - 99 mg/dL 148(H) 153(H) 99  BUN 8 - 23 mg/dL 25(H) 22 17  Creatinine 0.44 - 1.00 mg/dL 7.63(H) 5.40(H) 6.21(H)  Sodium 135 - 145 mmol/L 134(L) 137 137  Potassium 3.5 - 5.1 mmol/L 4.3 3.8 3.8  Chloride 98 - 111 mmol/L 93(L) 97(L) 96(L)  CO2 22 - 32 mmol/L 28 - 27  Calcium 8.9 - 10.3 mg/dL 8.2(L) - 8.3(L)  Total Protein 6.5 - 8.1 g/dL - - 7.8  Total Bilirubin 0.3 - 1.2 mg/dL - - 0.6  Alkaline Phos 38 - 126 U/L - - 68  AST 15 - 41 U/L - - 10(L)  ALT 0 - 44 U/L - - 11       Micro Results No results found for this or any previous visit (from the past 240 hour(s)).  Radiology Reports No results found.  SIGNED: Deatra James, MD, FHM. [facility],  Pager (please use [email or web address] to page/text) Please use Epic Secure Chat for non-urgent communication (7AM-7PM)  If 7PM-7AM, please contact night-coverage [email or web address], 06/09/2020, 10:19 AM

## 2020-06-10 LAB — CBC WITH DIFFERENTIAL/PLATELET
Abs Immature Granulocytes: 0.2 10*3/uL — ABNORMAL HIGH (ref 0.00–0.07)
Basophils Absolute: 0.1 10*3/uL (ref 0.0–0.1)
Basophils Relative: 0 %
Eosinophils Absolute: 0.1 10*3/uL (ref 0.0–0.5)
Eosinophils Relative: 1 %
HCT: 29.8 % — ABNORMAL LOW (ref 36.0–46.0)
Hemoglobin: 9 g/dL — ABNORMAL LOW (ref 12.0–15.0)
Immature Granulocytes: 1 %
Lymphocytes Relative: 9 %
Lymphs Abs: 2.1 10*3/uL (ref 0.7–4.0)
MCH: 24.8 pg — ABNORMAL LOW (ref 26.0–34.0)
MCHC: 30.2 g/dL (ref 30.0–36.0)
MCV: 82.1 fL (ref 80.0–100.0)
Monocytes Absolute: 1.2 10*3/uL — ABNORMAL HIGH (ref 0.1–1.0)
Monocytes Relative: 5 %
Neutro Abs: 20.6 10*3/uL — ABNORMAL HIGH (ref 1.7–7.7)
Neutrophils Relative %: 84 %
Platelets: 675 10*3/uL — ABNORMAL HIGH (ref 150–400)
RBC: 3.63 MIL/uL — ABNORMAL LOW (ref 3.87–5.11)
RDW: 17.2 % — ABNORMAL HIGH (ref 11.5–15.5)
WBC: 24.2 10*3/uL — ABNORMAL HIGH (ref 4.0–10.5)
nRBC: 0 % (ref 0.0–0.2)

## 2020-06-10 LAB — GLUCOSE, CAPILLARY
Glucose-Capillary: 145 mg/dL — ABNORMAL HIGH (ref 70–99)
Glucose-Capillary: 139 mg/dL — ABNORMAL HIGH (ref 70–99)
Glucose-Capillary: 123 mg/dL — ABNORMAL HIGH (ref 70–99)
Glucose-Capillary: 124 mg/dL — ABNORMAL HIGH (ref 70–99)
Glucose-Capillary: 123 mg/dL — ABNORMAL HIGH (ref 70–99)

## 2020-06-10 LAB — RENAL FUNCTION PANEL
Albumin: 2.2 g/dL — ABNORMAL LOW (ref 3.5–5.0)
Anion gap: 11 (ref 5–15)
BUN: 25 mg/dL — ABNORMAL HIGH (ref 8–23)
CO2: 29 mmol/L (ref 22–32)
Calcium: 8.4 mg/dL — ABNORMAL LOW (ref 8.9–10.3)
Chloride: 94 mmol/L — ABNORMAL LOW (ref 98–111)
Creatinine, Ser: 6.88 mg/dL — ABNORMAL HIGH (ref 0.44–1.00)
GFR, Estimated: 6 mL/min — ABNORMAL LOW (ref >60)
Glucose, Bld: 169 mg/dL — ABNORMAL HIGH (ref 70–99)
Phosphorus: 5.6 mg/dL — ABNORMAL HIGH (ref 2.5–4.6)
Potassium: 4.1 mmol/L (ref 3.5–5.1)
Sodium: 134 mmol/L — ABNORMAL LOW (ref 135–145)

## 2020-06-10 LAB — HEPATITIS B SURFACE ANTIBODY, QUANTITATIVE: Hep B S AB Quant (Post): <3.1 m[IU]/mL — ABNORMAL LOW (ref >9.9)

## 2020-06-10 MED ORDER — METOPROLOL SUCCINATE ER 50 MG PO TB24
50.0000 mg | ORAL_TABLET | Freq: Every day | ORAL | Status: DC
  Administered 2020-06-10 – 2020-06-16 (×7): 50 mg via ORAL
  Filled 2020-06-10 (×7): qty 1

## 2020-06-10 NOTE — Progress Notes (Addendum)
[facility]  Subjective  - Feeling good and less pain in the left LE than yesterday.   Objective (!) 155/70 81 98.2 F (36.8 C) (Oral) 17 91%  Intake/Output Summary (Last 24 hours) at 06/10/2020 0931 Last data filed at 06/10/2020 0708 Gross per 24 hour  Intake 340 ml  Output 2000 ml  Net -1660 ml    Left BKA incision line healing well, no sign of ischemia appears Mobility of stump at hip intact Lungs non labored breathing  Assessment/Planning: POD # 2 Left BKA  Stump healing well without ischemic changes, clean dry dressing applied.  Stump appears viable Will place order for retention sock   Roxy Horseman 06/10/2020 9:31 AM --  Laboratory Lab Results: Recent Labs    06/08/20 0550 06/08/20 0812 06/08/20 1240 06/09/20 0151  WBC 17.1*  --   --  22.2*  HGB 9.4*   < > 9.5* 8.7*  HCT 31.7*   < > 31.4* 29.2*  PLT 646*  --   --  631*   < > = values in this interval not displayed.   BMET Recent Labs    06/09/20 0151 06/10/20 0210  NA 134* 134*  K 4.3 4.1  CL 93* 94*  CO2 28 29  GLUCOSE 148* 169*  BUN 25* 25*  CREATININE 7.63* 6.88*  CALCIUM 8.2* 8.4*    COAG Lab Results  Component Value Date   INR 1.2 06/08/2020   No results found for: PTT  I have seen and evaluated the patient and agree with the above plan.  Annamarie Major

## 2020-06-10 NOTE — Plan of Care (Signed)
  Problem: Education: Goal: Knowledge of General Education information will improve Description Including pain rating scale, medication(s)/side effects and non-pharmacologic comfort measures Outcome: Progressing   Problem: Health Behavior/Discharge Planning: Goal: Ability to manage health-related needs will improve Outcome: Progressing   

## 2020-06-10 NOTE — Plan of Care (Signed)

## 2020-06-10 NOTE — Progress Notes (Signed)
[facility] Progress Note   Subjective:   Reports she usually urinates 1-2 times daily but has not urinated in past several days. Bladder scan today showed 467m. Feels like she is going to be able to urinate soon, if not would recommend in and out cath.  Otherwise, pt with no concerns. Denies SOB, CP, palpitations, dizziness, abdominal pain, and nausea.   Objective Vitals:   06/09/20 1427 06/09/20 2106 06/10/20 0500 06/10/20 0849  BP: 138/63 (!) 157/73 (!) 142/51 (!) 155/70  Pulse: 78 89 (!) 58 81  Resp: '17 15 15 17  '$ Temp: 97.7 F (36.5 C) 98.9 F (37.2 C) 98.7 F (37.1 C) 98.2 F (36.8 C)  TempSrc: Oral  Oral Oral  SpO2: 90% 100% 92% 91%  Weight:   102.7 kg   Height:       Physical Exam General: Well developed, alert female in NAD Heart: RRR, no murmurs, rubs or gallops Lungs: CTA bilaterally without wheezing, rhonchi or rales Abdomen: Soft, non-distended, +BS Extremities: L BKA, no edema R LE Dialysis Access: RUE AVG + bruit  Additional Objective Labs: Basic Metabolic Panel: Recent Labs  Lab 06/08/20 0550 06/08/20 0812 06/09/20 0151 06/10/20 0210  NA 137 137 134* 134*  K 3.8 3.8 4.3 4.1  CL 96* 97* 93* 94*  CO2 27  --  28 29  GLUCOSE 99 153* 148* 169*  BUN 17 22 25* 25*  CREATININE 6.21* 5.40* 7.63* 6.88*  CALCIUM 8.3*  --  8.2* 8.4*  PHOS  --   --  6.1* 5.6*   Liver Function Tests: Recent Labs  Lab 06/08/20 0550 06/09/20 0151 06/10/20 0210  AST 10*  --   --   ALT 11  --   --   ALKPHOS 68  --   --   BILITOT 0.6  --   --   PROT 7.8  --   --   ALBUMIN 2.5* 2.2* 2.2*   CBC: Recent Labs  Lab 06/08/20 0550 06/08/20 0812 06/08/20 1240 06/09/20 0151  WBC 17.1*  --   --  22.2*  HGB 9.4* 10.5* 9.5* 8.7*  HCT 31.7* 31.0* 31.4* 29.2*  MCV 83.6  --   --  82.3  PLT 646*  --   --  631*   Blood Culture    Component Value Date/Time   SDES BLOOD RIGHT ARM 02/22/2020 1300   SPECREQUEST  02/22/2020 1300    BOTTLES DRAWN AEROBIC AND  ANAEROBIC Blood Culture results may not be optimal due to an inadequate volume of blood received in culture bottles   CULT  02/22/2020 1300    NO GROWTH 5 DAYS [facility] [address], [place] [address]   REPTSTATUS 02/27/2020 FINAL 02/22/2020 1300   CBG: Recent Labs  Lab 06/08/20 1932 06/09/20 1114 06/09/20 1609 06/09/20 2105 06/10/20 0646  GLUCAP 160* 128* 146* 144* 145*   Medications: .  ceFAZolin (ANCEF) IV     . amLODipine  10 mg Oral QHS  . aspirin EC  81 mg Oral QHS  . atorvastatin  40 mg Oral QHS  . Chlorhexidine Gluconate Cloth  6 each Topical Q0600  . clopidogrel  75 mg Oral Daily  . heparin  5,000 Units Subcutaneous Q8H  . insulin aspart  0-15 Units Subcutaneous TID WC  . insulin glargine  20 Units Subcutaneous Daily  . multivitamin  1 tablet Oral QHS  . sevelamer carbonate  2,400 mg Oral TID WC  . sodium  chloride flush  3 mL Intravenous Q12H    Outpatient Dialysis Orders: [facility]   Assessment/Plan: 1. 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 ESRD- tolerating dialysis well. K+ controlled, no symptoms of uremia. Continue dialysis on MWF schedule.  3. HTN/ volume -No significant volume overload on exam. BP adequately controlled. Continue amlodipine. Had net UF 2L on 06/09/20.  4. DM2 - per admitting team. 5. Anemia ckd - Hgb down to 8.7 post op. Will request outpatient ESA records.  6. MBD ckd - Calcium controlled, phos slightly high. Continue renvela and follow.   Anice Paganini, PA-C 06/10/2020, 9:24 AM  [facility] Pager: [phone]

## 2020-06-10 NOTE — Progress Notes (Signed)
Inpatient Rehab Admissions:  Inpatient Rehab Consult received.  I met with patient at the bedside for rehabilitation assessment and to discuss goals and expectations of an inpatient rehab admission.  Pt acknowledged understanding of goals and expectations. Pt interested in pursuing CIR.  Pt gave permission to contact daughter, [name]. Spoke with daughter via phone. Explained CIR goals and expectations and she acknowledged understanding. She is also interested in pt pursuing CIR.  Daughter indicated that she is available to assist pt after discharge from hospital. Will continue to follow.  Signed: Gayland Curry, [place], [place] Admissions Coordinator [phone]

## 2020-06-10 NOTE — Progress Notes (Signed)
Patient mentioned to RN that she had not urinated for two days. RN bladder scanned- yielded 405 ml. Patient said sometimes she goes for at least 2 days without urinating. RN wanted to do In and out cath but patient refused and said she will try on her own to urinate. Reported to the Day shift RN

## 2020-06-10 NOTE — PMR Pre-admission (Signed)
PMR Admission Coordinator Pre-Admission Assessment  Patient: Christy Zhang is an 65 y.o., female MRN: [id number] DOB: [birth year] Height: $RemoveBeforeDE'5\' 4"'[id number]$  (162.6 cm) Weight: 103.1 kg  Insurance Information HMO:     PPO:      PCP:      IPA:      80/20:      OTHER:  PRIMARY: Medicare A & B      Policy#: [id number]      Subscriber: patient C Benefits:  Phone #: verified eligibility via [employer] on 06/13/20     Name:  Eff. Date: Part A & B effective; 11/28/14     Deduct: $1,556      Out of Pocket Max: NA      Life Max: NA CIR: 100% with Medicare approval   SNF: 100% days 1-20, 80% days 21-100 Outpatient: 80%     Co-Pay: 20% Home Health: 100%      Co-Pay:  DME: 80%     Co-Pay: 20%  Providers: pt's choice  SECONDARY: Medicaid of [place]      Policy#: [id number]     Phone#: [phone] 2/15 MADQN  The "Data Collection Information Summary" for patients in Inpatient Rehabilitation Facilities with attached "Privacy Act [place] Records" was provided and verbally reviewed with: Patient  Emergency Contact Information Contact Information    Name Relation Home Work [place] Daughter [phone]  [phone]      Current Medical History  Patient Admitting Diagnosis: s/p L BKA  History of Present Illness: 65 year old female with medical history significant for ESRD on Hemodialysis, IDDM, HTN, PVD and gangrene requiring amputation presented on 06/08/2020 for non healing left transmetatarsal amputation. History of angioplasty of the left anterior tibial artery on 05/26/2019, but subsequently underwent transmet on the following day. Surgery for BKA postponed after patient developed COVID on 05/02/2020.   Presented on 06/08/2020 and underwent BKA to left leg with Dr. Stanford Breed. Postoperatively receiving hemodialysis per Nephrology and pain management per vascular. Hemodialysis on MWF schedule. Essential HTN with meds of Toprol XL and amlodipine. Continue Plavix, ASA and statin for PVD.  Home regimen with type 2 DM and SSI coverage as needed.   Patient[facility] has been reviewed by the rehabilitation admission coordinator and physician.  Past Medical History  Past Medical History:  Diagnosis Date  . Anemia   . Arthritis   . Asthma    in the past  . CHF (congestive heart failure) ([place])   . Chronic kidney disease    Renal failure, dialysis on M/W/F  . 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 End stage renal disease ([place])   . End stage renal disease on dialysis ([place])   . Hemodialysis access, arteriovenous graft ([place])   . Hyperlipidemia   . Hypertension   . Neuromuscular disorder ([place])     Family History   family history includes Cancer in her father; Diabetes in her daughter, father, mother, and son; Heart disease in her daughter and son; Hypertension in her daughter, mother, sister, and son; Peripheral vascular disease in her son.  Prior Rehab/Hospitalizations Has the patient had prior rehab or hospitalizations prior to admission? Yes  Has the patient had major surgery during 100 days prior to admission? Yes   Current Medications  Current Facility-Administered Medications:  .  (feeding supplement) PROSource Plus liquid 30 mL, 30 mL, Oral, BID BM, Penninger, Lindsay, PA, 30 mL at 06/16/20 1000 .  acetaminophen (  TYLENOL) tablet 650 mg, 650 mg, Oral, Q6H PRN, 650 mg at 06/13/20 0709 **OR** acetaminophen (TYLENOL) suppository 650 mg, 650 mg, Rectal, Q6H PRN, Smith, Rondell A, MD .  amLODipine (NORVASC) tablet 10 mg, 10 mg, Oral, QHS, Smith, Rondell A, MD, 10 mg at 06/15/20 2128 .  aspirin EC tablet 81 mg, 81 mg, Oral, QHS, Smith, Rondell A, MD, 81 mg at 06/15/20 2128 .  atorvastatin (LIPITOR) tablet 40 mg, 40 mg, Oral, QHS, Smith, Rondell A, MD, 40 mg at 06/15/20 2128 .  Chlorhexidine Gluconate Cloth 2 % PADS 6 each, 6 each, Topical, Q0600, Roney Jaffe, MD, 6 each at 06/16/20 0601 .  Chlorhexidine Gluconate Cloth 2  % PADS 6 each, 6 each, Topical, Q0600, Penninger, Lindsay, [place], 6 each at 06/16/20 0601 .  clopidogrel (PLAVIX) tablet 75 mg, 75 mg, Oral, Daily, Smith, Rondell A, MD, 75 mg at 06/16/20 1002 .  Darbepoetin Alfa (ARANESP) injection 60 mcg, 60 mcg, Intravenous, Q Fri-HD, Penninger, Lindsay, PA .  diphenhydrAMINE (BENADRYL) capsule 25 mg, 25 mg, Oral, Q6H PRN, Smith, Rondell A, MD .  doxercalciferol (HECTOROL) injection 1.5 mcg, 1.5 mcg, Intravenous, Q M,W,F-HD, Penninger, Lindsay, PA .  heparin injection 5,000 Units, 5,000 Units, Subcutaneous, Q8H, Smith, Rondell A, MD, 5,000 Units at 06/16/20 0600 .  insulin aspart (novoLOG) injection 0-15 Units, 0-15 Units, Subcutaneous, TID WC, Smith, Rondell A, MD, 3 Units at 06/15/20 1800 .  insulin glargine (LANTUS) injection 20 Units, 20 Units, Subcutaneous, Daily, Fuller Plan A, MD, 20 Units at 06/16/20 1001 .  metoprolol succinate (TOPROL-XL) 24 hr tablet 50 mg, 50 mg, Oral, Daily, Shahmehdi, Seyed A, MD, 50 mg at 06/16/20 1002 .  multivitamin (RENA-VIT) tablet 1 tablet, 1 tablet, Oral, QHS, Smith, Rondell A, MD, 1 tablet at 06/15/20 2128 .  oxyCODONE-acetaminophen (PERCOCET/ROXICET) 5-325 MG per tablet 1 tablet, 1 tablet, Oral, Q8H PRN, Fuller Plan A, MD, 1 tablet at 06/13/20 2224 .  polyethylene glycol (MIRALAX / GLYCOLAX) packet 17 g, 17 g, Oral, Daily, Shahmehdi, Seyed A, MD, 17 g at 06/15/20 1025 .  senna-docusate (Senokot-S) tablet 1 tablet, 1 tablet, Oral, BID, Shahmehdi, Seyed A, MD, 1 tablet at 06/16/20 1002 .  sevelamer carbonate (RENVELA) tablet 1,600 mg, 1,600 mg, Oral, PRN, Tamala Julian, Rondell A, MD .  sevelamer carbonate (RENVELA) tablet 2,400 mg, 2,400 mg, Oral, TID WC, Smith, Rondell A, MD, 2,400 mg at 06/16/20 0900 .  sodium chloride flush (NS) 0.9 % injection 3 mL, 3 mL, Intravenous, Q12H, Smith, Rondell A, MD, 3 mL at 06/16/20 1003  Patients Current Diet:  Diet Order            Diet renal/carb modified with fluid restriction Diet-HS  Snack? Nothing; Fluid restriction: 1200 mL Fluid; Room service appropriate? Yes; Fluid consistency: Thin  Diet effective now                 Precautions / Restrictions Precautions Precautions: Fall Restrictions Weight Bearing Restrictions: Yes LLE Weight Bearing: Non weight bearing   Has the patient had 2 or more falls or a fall with injury in the past year? No  Prior Activity Level Community (5-7x/wk): went somewhere daily, dialysis 3x/week  Prior Functional Level Self Care: Did the patient need help bathing, dressing, using the toilet or eating? Independent  Indoor Mobility: Did the patient need assistance with walking from room to room (with or without device)? Independent  Stairs: Did the patient need assistance with internal or external stairs (with or without device)? Needed some help  Functional Cognition: Did the patient need help planning regular tasks such as shopping or remembering to take medications? Independent  Home Assistive Devices / Equipment Home Assistive Devices/Equipment: CBG Meter,Wheelchair,Walker (specify type),Eyeglasses,Scales Home Equipment: Walker - 2 wheels,Tub bench,Hand held shower head,Wheelchair - manual  Prior Device Use: Indicate devices/aids used by the patient prior to current illness, exacerbation or injury? Manual wheelchair  Current Functional Level Cognition  Overall Cognitive Status: Within Functional Limits for tasks assessed Orientation Level: Oriented X4    Extremity Assessment (includes Sensation/Coordination)  Upper Extremity Assessment: Defer to OT evaluation  Lower Extremity Assessment: Generalized weakness    ADLs  Overall ADL's : Needs assistance/impaired Eating/Feeding: Set up,Independent,Sitting Grooming: Wash/dry hands,Wash/dry face,Sitting,Supervision/safety,Set up Upper Body Bathing: Supervision/ safety,Set up,Sitting Upper Body Bathing Details (indicate cue type and reason): simulated seated in recliner Lower  Body Bathing: Sitting/lateral leans,Maximal assistance Lower Body Bathing Details (indicate cue type and reason): simulated via LB dressing from EOB Upper Body Dressing : Supervision/safety,Set up,Sitting Upper Body Dressing Details (indicate cue type and reason): donned clean gown Lower Body Dressing: Maximal assistance,Sitting/lateral leans Lower Body Dressing Details (indicate cue type and reason): to reposition KI from EOB Toilet Transfer: Moderate assistance,RW Toilet Transfer Details (indicate cue type and reason): unable to pivot but MOD A to rise into standing Toileting- Clothing Manipulation and Hygiene: Minimal assistance,Sitting/lateral lean Functional mobility during ADLs: Moderate assistance,Rolling walker (sit<>stand only) General ADL Comments: pt limited by decreased activity tolerance, impaired balance and generlized weakness    Mobility  Overal bed mobility: Needs Assistance Bed Mobility: Supine to Sit Supine to sit: Supervision,HOB elevated Sit to supine: Min guard General bed mobility comments: min guard for safety with HOB elevated    Transfers  Overall transfer level: Needs assistance Equipment used: Rolling walker (2 wheeled) Transfers: Sit to/from [employer] Sit to Stand: Min assist Stand pivot transfers: Min assist General transfer comment: sit to stand x 3 with minA to power up. Stand pivot to recliner with minA, patient unable to hop this session but able to pivot R foot towards bed    Ambulation / Gait / Stairs / Wheelchair Mobility  Ambulation/Gait Ambulation/Gait assistance: Counsellor (Feet): 2 Feet Assistive device: Rolling walker (2 wheeled) Gait Pattern/deviations: Step-to pattern (hop to pattern.) General Gait Details: pivoting foot towards HOB for repositioning, unable to hop on R LE this session    Posture / Balance Dynamic Sitting Balance Sitting balance - Comments: patient requires at least one UE  support Balance Overall balance assessment: Needs assistance Sitting-balance support: Single extremity supported Sitting balance-Leahy Scale: Fair Sitting balance - Comments: patient requires at least one UE support Standing balance support: Bilateral upper extremity supported,During functional activity Standing balance-Leahy Scale: Poor Standing balance comment: reliant on UE support    Special needs/care consideration Designated visitor is daughter, [name] Hemodialysis MWF as outpatient Daughter, [name], lives with patient and is her aide 4 hrs per day and provides her transportation to outpatient hemodialysis   Previous Home Environment  Living Arrangements: Children,Other relatives (2 grandsons)  Lives With: Daughter Available Help at Discharge: Family Type of Home: [place]: Two level,Able to live on main level with bedroom/bathroom Home Access: Ramped entrance Bathroom Shower/Tub: Tub/shower unit (pt takes sponge baths) Bathroom Toilet: Standard (pt uses bedside commode) Bathroom Accessibility: Yes How Accessible: Accessible via walker [place]: Yes Type of [place] (if known): [facility]  Discharge Living Setting Plans for Discharge Living Setting:  Patient's home Type of Home at Discharge: House Discharge Home Layout: Two level,Able to live on main level with bedroom/bathroom Discharge Home Access: [place] entrance Discharge Bathroom Shower/Tub: Tub/shower unit (pt takes sponge baths) Discharge Bathroom Toilet: Standard (pt uses bedside commode) Discharge Bathroom Accessibility: Yes How Accessible: Accessible via walker Does the patient have any problems obtaining your medications?: No  Social/Family/Support Systems Anticipated Caregiver: [name], daughter Anticipated Caregiver's Contact Information: [phone] Caregiver Availability: 24/7 Discharge Plan Discussed with Primary Caregiver: Yes Is  Caregiver In Agreement with Plan?: Yes Does Caregiver/Family have Issues with Lodging/Transportation while Pt is in Rehab?: No  Goals Patient/Family Goal for Rehab: Mod I with PT And OT at wheelchair level Expected length of stay: ELOS 7 to 10 days  Decrease burden of Care through IP rehab admission: NA  Possible need for SNF placement upon discharge: Not anticipated  Patient Condition: I have reviewed medical records from [facility], spoken with  patient. I met with patient at the bedside for inpatient rehabilitation assessment.  Patient will benefit from ongoing PT and OT, can actively participate in 3 hours of therapy a day 5 days of the week, and can make measurable gains during the admission.  Patient will also benefit from the coordinated team approach during an Inpatient Acute Rehabilitation admission.  The patient will receive intensive therapy as well as Rehabilitation physician, nursing, social worker, and care management interventions.  Due to bladder management, bowel management, safety, skin/wound care, disease management, medication administration, pain management and patient education the patient requires 24 hour a day rehabilitation nursing.  The patient is currently mod assist overall with mobility and basic ADLs.  Discharge setting and therapy post discharge at home with home health is anticipated.  Patient has agreed to participate in the Acute Inpatient Rehabilitation Program and will admit today.  Preadmission Screen Completed By:  Cleatrice Burke, 06/16/2020 10:32 AM ______________________________________________________________________   Discussed status with Dr. Naaman Plummer  on  06/16/2020 at  1035 and received approval for admission today.  Admission Coordinator:  Cleatrice Burke, RN, time  5597 Date  06/16/2020   Assessment/Plan: Diagnosis: left bka 1. Does the need for close, 24 hr/day Medical supervision in concert with the patient's rehab needs make  it unreasonable for this patient to be served in a less intensive setting? Yes 2. Co-Morbidities requiring supervision/potential complications: ESRD on HD, DM, HTN 3. Due to bladder management, bowel management, safety, skin/wound care, disease management, medication administration, pain management and patient education, does the patient require 24 hr/day rehab nursing? Yes 4. Does the patient require coordinated care of a physician, rehab nurse, PT, OT, and SLP to address physical and functional deficits in the context of the above medical diagnosis(es)? Yes Addressing deficits in the following areas: balance, endurance, locomotion, strength, transferring, bowel/bladder control, bathing, dressing, feeding, grooming, toileting and psychosocial support 5. Can the patient actively participate in an intensive therapy program of at least 3 hrs of therapy 5 days a week? Yes 6. The potential for patient to make measurable gains while on inpatient rehab is excellent 7. Anticipated functional outcomes upon discharge from inpatient rehab: modified independent PT, modified independent OT, n/a SLP wc level goals 8. Estimated rehab length of stay to reach the above functional goals is: 7-10 days 9. Anticipated discharge destination: Home 10. Overall Rehab/Functional Prognosis: excellent   MD Signature: Meredith Staggers, MD, [facility] 06/16/2020

## 2020-06-10 NOTE — Plan of Care (Signed)
  Problem: Education: Goal: Knowledge of General Education information will improve Description: Including pain rating scale, medication(s)/side effects and non-pharmacologic comfort measures 06/10/2020 0707 by Claire Shown, RN Outcome: Progressing 06/10/2020 0706 by Claire Shown, RN Outcome: Progressing   Problem: Clinical Measurements: Goal: Ability to maintain clinical measurements within normal limits will improve 06/10/2020 0707 by Claire Shown, RN Outcome: Progressing 06/10/2020 0706 by Claire Shown, RN Outcome: Progressing   Problem: Clinical Measurements: Goal: Will remain free from infection Outcome: Progressing   Problem: Coping: Goal: Level of anxiety will decrease 06/10/2020 0707 by Claire Shown, RN Outcome: Progressing 06/10/2020 0706 by Claire Shown, RN Outcome: Progressing   Problem: Elimination: Goal: Will not experience complications related to bowel motility Outcome: Progressing

## 2020-06-10 NOTE — Progress Notes (Signed)
PROGRESS NOTE    Patient: Christy Zhang                            PCP: Claretta Fraise, MD                    DOB: Apr 15, 1956            DOA: 06/08/2020 IP:[id number]             DOS: 06/10/2020, 11:28 AM   LOS: 2 days   Date of Service: The patient was seen and examined on 06/10/2020  Subjective:   POD # 2 Left BKA Status post hemodialysis 06/09/2020 -tolerated The patient was seen and examined this morning, remained stable no acute distress Complain of some pain difficulty with mobility  Brief Narrative:    Christy Zhang is a 65 y.o. female with medical history significant of ESRD on HD, IDDM, HTN, HLD, PVD, and gangrene requiring prior amputation presents for nonhealing left transmetatarsal amputation.   Patient had been hospitalized in 01/2020 after being found to have gangrene of her left toes.  She underwent angioplasty of the left anterior tibial artery on 02/23/2020, but subsequently underwent transmetatarsal amputation of the left the following day.  Patient had home health coming out to do dressing changes twice a week.  However, when the patient follow back up with vascular surgery it was noted that the wound was not healing appropriately and it was recommended that she have a below-knee amputation.  Surgery had initially been scheduled for 04/11/2020, but patient wanted to have a second opinion with the wound care doctor.  After being evaluated by the wound care specialist patient was advised that it likely would not heal.  Surgery has been postponed after patient developed Covid on 05/02/2020, and then subsequently on 05/16/2020 due to incliment weather.    On 06/08/2020 the  patient underwent below-knee amputation of the left leg with Dr. Stanford Breed with less than 50 mL of blood loss..  Labs prior to the procedure were significant for WBC 17.1, hemoglobin 9.4, platelets 646, BUN 17, creatinine 6.21, and albumin 2.5.   Assessment & Plan:   Principal Problem:   S/P BKA (below  knee amputation) unilateral (HCC) Active Problems:   ESRD (end stage renal disease) ([place])   Controlled type 2 diabetes mellitus with chronic kidney disease on chronic dialysis, with long-term current use of insulin (HCC)   Essential (primary) hypertension   Obesity, Class III, BMI 40-49.9 (morbid obesity) ([place])   Gangrene (HCC)    Assessment/Plan  Gangrene of the left forefoot  POD # 2 Left BKA -Remained stable. -  nonhealing transmetatarsal amputation of the left foot with gangrene.   - Left below-knee amputation performed by Dr. Stanford Breed of vascular  - S/P left below-knee amputation: 06/08/2020  -Continue preop  IV ABx Cefazolin .Marland Kitchen Empirically due to leukocytosis -Continue with pain management, oxycodone, as needed IV narcotics -Postop care per vascular -Transitions of care consult  Leukocytosis:  -Monitoring, continue empiric antibiotics -No signs of sepsis afebrile normotensive - WBC elevated at 17.1 >> 22.2  >>>  -Following blood cultures NGTD   ESRD on HD:  -Patient normally dialyzes Monday, Wednesday, and Friday.   -Hemodialysis on 06/09/2020 -Nephrology following closely appreciate input   Essential hypertension:  -Monitoring remained stable -Patient blood pressure remained stable -Continue home meds:  Home blood pressure medications include metoprolol succinate 100 mg daily and amlodipine 10 mg  nightly. -Presumed amlodipine 10 mg daily -Resolved bradycardia, resuming  metoprolol  Anemia of chronic disease -with chronic kidney disease  -Monitoring H&H  Estimated blood loss less than 50 mL -Hemoglobin 10.5, 9.5, 8.7 -No signs of bleeding -Continue to monitor  Diabetes mellitus type II,  -Seems to be well controlled -Check an A1c 5.8, last A1c 6.4 12/29/2019 -Checking CBG QA CHS, SSI coverage -  Home regimen includes glargine 100 units daily and Trulicity 1.5 mg weekly. -Continue with reduced  home Lantus 20 unit units daily while in the hospital given more  controlled diet  PVD -Continue Plavix, aspirin and statin -Stable, vascular following  Hyperlipidemia -Continue home atorvastatin 40 mg nightly -Stable  Hypoalbuminemia: Acute.   Albumin noted 2.5, 2.2  -Monitoring replete as needed  Morbid obesity BMI 40.34 kg/m -Patient has been counseled regarding aggressive weight loss -Dietary education -Increase activity postop  ------------------------------------------------------------------------------------------------------------------------------------ Nutritional status:  The patient's BMI is: Body mass index is 38.86 kg/m. I agree with the assessment and plan as outlined below:  Skin Assessment: I have examined the patient's skin and I agree with the wound assessment as performed by wound care team As outlined ....      ------------------------------------------------------------------------------------------------------------------------------------ Cultures; Blood Cultures x 2 >> NGTD  Antimicrobials: 06/08/2020 Ancef >>>   Consultants: Vascular surgery and nephrology  ------------------------------------------------------------------------------------------------------------------------------------  DVT prophylaxis:  Heparin SQ Code Status:   Code Status: Full Code  Family Communication: Discussed with patient in detail no family member present at bedside-  -Advance care planning has been discussed.   Admission status:   Status is: Inpatient  Remains inpatient appropriate because:Inpatient level of care appropriate due to severity of illness   Dispo: The patient is from: Home              Anticipated d/c is to: Status post PT OT evaluation, CIR recommended, consultation placed              Anticipated d/c date is: Likely CIR in 1 to 2 days              Patient currently is not medically stable to d/c.   Difficult to place patient Yes multiple, reviewed, amputation, generalized weakness      Level  of care: Telemetry Medical   Procedures:   AMPUTATION BELOW KNEE     Antimicrobials:  Anti-infectives (From admission, onward)   Start     Dose/Rate Route Frequency Ordered Stop   06/10/20 1230  ceFAZolin (ANCEF) IVPB 1 g/50 mL premix        1 g 100 mL/hr over 30 Minutes Intravenous Every 24 hours 06/09/20 1507 06/12/20 1229   06/09/20 1000  ceFAZolin (ANCEF) IVPB 1 g/50 mL premix  Status:  Discontinued        1 g 100 mL/hr over 30 Minutes Intravenous Every 12 hours 06/09/20 0723 06/09/20 1507   06/08/20 0549  ceFAZolin (ANCEF) IVPB 2g/100 mL premix        2 g 200 mL/hr over 30 Minutes Intravenous 30 min pre-op 06/08/20 0549 06/08/20 0809       Medication:  . amLODipine  10 mg Oral QHS  . aspirin EC  81 mg Oral QHS  . atorvastatin  40 mg Oral QHS  . Chlorhexidine Gluconate Cloth  6 each Topical Q0600  . clopidogrel  75 mg Oral Daily  . heparin  5,000 Units Subcutaneous Q8H  . insulin aspart  0-15 Units Subcutaneous TID WC  . insulin glargine  20 Units Subcutaneous  Daily  . multivitamin  1 tablet Oral QHS  . sevelamer carbonate  2,400 mg Oral TID WC  . sodium chloride flush  3 mL Intravenous Q12H    acetaminophen **OR** acetaminophen, diphenhydrAMINE, oxyCODONE-acetaminophen, sevelamer carbonate   Objective:   Vitals:   06/09/20 1427 06/09/20 2106 06/10/20 0500 06/10/20 0849  BP: 138/63 (!) 157/73 (!) 142/51 (!) 155/70  Pulse: 78 89 (!) 58 81  Resp: '17 15 15 17  '$ Temp: 97.7 F (36.5 C) 98.9 F (37.2 C) 98.7 F (37.1 C) 98.2 F (36.8 C)  TempSrc: Oral  Oral Oral  SpO2: 90% 100% 92% 91%  Weight:   102.7 kg   Height:        Intake/Output Summary (Last 24 hours) at 06/10/2020 1128 Last data filed at 06/10/2020 0708 Gross per 24 hour  Intake 340 ml  Output --  Net 340 ml   Filed Weights   06/09/20 0740 06/09/20 1025 06/10/20 0500  Weight: 105.2 kg 103 kg 102.7 kg     Examination:      Physical Exam:   General:  Alert, oriented, cooperative, no  distress;   HEENT:  Normocephalic, PERRL, otherwise with in Normal limits   Neuro:  CNII-XII intact. , normal motor and sensation, reflexes intact   Lungs:   Clear to auscultation BL, Respirations unlabored, no wheezes / crackles  Cardio:    S1/S2, RRR, No murmure, No Rubs or Gallops   Abdomen:   Soft, non-tender, bowel sounds active all four quadrants,  no guarding or peritoneal signs.  Muscular skeletal:   Left BKA, Limited exam - in bed, able to move all 4 extremities, Normal strength,  2+ pulses,  symmetric, No pitting edema  Skin:  Dry, warm to touch, negative for any Rashes, right upper arm shunt,  Wounds: Please see nursing documentation  -left BKA postsurgical wound dressing and immobilizer in place            ------------------------------------------------------------------------------------------------------------------------------------------    LABs:  CBC Latest Ref Rng & Units 06/09/2020 06/08/2020 06/08/2020  WBC 4.0 - 10.5 K/uL 22.2(H) - -  Hemoglobin 12.0 - 15.0 g/dL 8.7(L) 9.5(L) 10.5(L)  Hematocrit 36.0 - 46.0 % 29.2(L) 31.4(L) 31.0(L)  Platelets 150 - 400 K/uL 631(H) - -   CMP Latest Ref Rng & Units 06/10/2020 06/09/2020 06/08/2020  Glucose 70 - 99 mg/dL 169(H) 148(H) 153(H)  BUN 8 - 23 mg/dL 25(H) 25(H) 22  Creatinine 0.44 - 1.00 mg/dL 6.88(H) 7.63(H) 5.40(H)  Sodium 135 - 145 mmol/L 134(L) 134(L) 137  Potassium 3.5 - 5.1 mmol/L 4.1 4.3 3.8  Chloride 98 - 111 mmol/L 94(L) 93(L) 97(L)  CO2 22 - 32 mmol/L 29 28 -  Calcium 8.9 - 10.3 mg/dL 8.4(L) 8.2(L) -  Total Protein 6.5 - 8.1 g/dL - - -  Total Bilirubin 0.3 - 1.2 mg/dL - - -  Alkaline Phos 38 - 126 U/L - - -  AST 15 - 41 U/L - - -  ALT 0 - 44 U/L - - -       Micro Results No results found for this or any previous visit (from the past 240 hour(s)).  Radiology Reports No results found.  SIGNED: Deatra James, MD, FHM. [facility],  Pager (please use [email or web address] to page/text) Please use  Epic Secure Chat for non-urgent communication (7AM-7PM)  If 7PM-7AM, please contact night-coverage [email or web address], 06/10/2020, 11:28 AM

## 2020-06-11 LAB — CBC
HCT: 28.5 % — ABNORMAL LOW (ref 36.0–46.0)
Hemoglobin: 8.7 g/dL — ABNORMAL LOW (ref 12.0–15.0)
MCH: 24.9 pg — ABNORMAL LOW (ref 26.0–34.0)
MCHC: 30.5 g/dL (ref 30.0–36.0)
MCV: 81.7 fL (ref 80.0–100.0)
Platelets: 619 10*3/uL — ABNORMAL HIGH (ref 150–400)
RBC: 3.49 MIL/uL — ABNORMAL LOW (ref 3.87–5.11)
RDW: 17.2 % — ABNORMAL HIGH (ref 11.5–15.5)
WBC: 18.4 10*3/uL — ABNORMAL HIGH (ref 4.0–10.5)
nRBC: 0 % (ref 0.0–0.2)

## 2020-06-11 LAB — RENAL FUNCTION PANEL
Albumin: 2 g/dL — ABNORMAL LOW (ref 3.5–5.0)
Anion gap: 12 (ref 5–15)
BUN: 39 mg/dL — ABNORMAL HIGH (ref 8–23)
CO2: 29 mmol/L (ref 22–32)
Calcium: 7.9 mg/dL — ABNORMAL LOW (ref 8.9–10.3)
Chloride: 91 mmol/L — ABNORMAL LOW (ref 98–111)
Creatinine, Ser: 8.64 mg/dL — ABNORMAL HIGH (ref 0.44–1.00)
GFR, Estimated: 5 mL/min — ABNORMAL LOW (ref >60)
Glucose, Bld: 123 mg/dL — ABNORMAL HIGH (ref 70–99)
Phosphorus: 6 mg/dL — ABNORMAL HIGH (ref 2.5–4.6)
Potassium: 4.2 mmol/L (ref 3.5–5.1)
Sodium: 132 mmol/L — ABNORMAL LOW (ref 135–145)

## 2020-06-11 LAB — GLUCOSE, CAPILLARY
Glucose-Capillary: 102 mg/dL — ABNORMAL HIGH (ref 70–99)
Glucose-Capillary: 110 mg/dL — ABNORMAL HIGH (ref 70–99)
Glucose-Capillary: 89 mg/dL (ref 70–99)
Glucose-Capillary: 133 mg/dL — ABNORMAL HIGH (ref 70–99)

## 2020-06-11 MED ORDER — SENNOSIDES-DOCUSATE SODIUM 8.6-50 MG PO TABS
1.0000 | ORAL_TABLET | Freq: Two times a day (BID) | ORAL | Status: DC
  Administered 2020-06-11 – 2020-06-16 (×11): 1 via ORAL
  Filled 2020-06-11 (×11): qty 1

## 2020-06-11 MED ORDER — POLYETHYLENE GLYCOL 3350 17 G PO PACK
17.0000 g | PACK | Freq: Every day | ORAL | Status: DC
  Administered 2020-06-11 – 2020-06-15 (×5): 17 g via ORAL
  Filled 2020-06-11 (×6): qty 1

## 2020-06-11 MED ORDER — CHLORHEXIDINE GLUCONATE CLOTH 2 % EX PADS: 6.0000 | MEDICATED_PAD | Freq: Every day | CUTANEOUS | Status: DC

## 2020-06-11 NOTE — Progress Notes (Signed)
[facility] Progress Note   Subjective:   Feeling well, was able to urinate yesterday and today. Denies SOB, CP, palpitations, dizziness, and nausea. Pain well controlled.   Objective Vitals:   06/10/20 1400 06/10/20 1540 06/10/20 2021 06/11/20 0500  BP: (!) 154/77 (!) 148/67 (!) 142/61 (!) 144/64  Pulse: 79 75 79 76  Resp: '16 17 17 17  '$ Temp: 98 F (36.7 C) 98 F (36.7 C) 98.3 F (36.8 C) 98.8 F (37.1 C)  TempSrc: Oral Oral Oral Axillary  SpO2: 97% 97% 95% 96%  Weight:    106.1 kg  Height:       Physical Exam  General:Well developed, alert female in NAD Heart:RRR, no murmurs, rubs or gallops Lungs:CTA bilaterally without wheezing, rhonchi or rales Abdomen:Soft, non-distended, +BS Extremities:L BKA, no edema R LE Dialysis Access:RUE AVG + bruit  Additional Objective Labs: Basic Metabolic Panel: Recent Labs  Lab 06/09/20 0151 06/10/20 0210 06/11/20 0239  NA 134* 134* 132*  K 4.3 4.1 4.2  CL 93* 94* 91*  CO2 '28 29 29  '$ GLUCOSE 148* 169* 123*  BUN 25* 25* 39*  CREATININE 7.63* 6.88* 8.64*  CALCIUM 8.2* 8.4* 7.9*  PHOS 6.1* 5.6* 6.0*   Liver Function Tests: Recent Labs  Lab 06/08/20 0550 06/09/20 0151 06/10/20 0210 06/11/20 0239  AST 10*  --   --   --   ALT 11  --   --   --   ALKPHOS 68  --   --   --   BILITOT 0.6  --   --   --   PROT 7.8  --   --   --   ALBUMIN 2.5* 2.2* 2.2* 2.0*   CBC: Recent Labs  Lab 06/08/20 0550 06/08/20 0812 06/09/20 0151 06/10/20 1212 06/11/20 0239  WBC 17.1*  --  22.2* 24.2* 18.4*  NEUTROABS  --   --   --  20.6*  --   HGB 9.4*   < > 8.7* 9.0* 8.7*  HCT 31.7*   < > 29.2* 29.8* 28.5*  MCV 83.6  --  82.3 82.1 81.7  PLT 646*  --  631* 675* 619*   < > = values in this interval not displayed.   Blood Culture    Component Value Date/Time   SDES BLOOD LEFT HAND 06/09/2020 1140   SPECREQUEST  06/09/2020 1140    BOTTLES DRAWN AEROBIC ONLY Blood Culture results may not be optimal due to an inadequate  volume of blood received in culture bottles   CULT  06/09/2020 1140    NO GROWTH 1 DAY Performed at [facility], [place] [address], [place], [place] [address]    REPTSTATUS PENDING 06/09/2020 1140   CBG: Recent Labs  Lab 06/10/20 1118 06/10/20 1346 06/10/20 1628 06/10/20 2019 06/11/20 0647  GLUCAP 139* 123* 124* 123* 102*   Medications: .  ceFAZolin (ANCEF) IV Stopped (06/10/20 1441)   . amLODipine  10 mg Oral QHS  . aspirin EC  81 mg Oral QHS  . atorvastatin  40 mg Oral QHS  . Chlorhexidine Gluconate Cloth  6 each Topical Q0600  . clopidogrel  75 mg Oral Daily  . heparin  5,000 Units Subcutaneous Q8H  . insulin aspart  0-15 Units Subcutaneous TID WC  . insulin glargine  20 Units Subcutaneous Daily  . metoprolol succinate  50 mg Oral Daily  . multivitamin  1 tablet Oral QHS  . sevelamer carbonate  2,400 mg Oral TID WC  . sodium  chloride flush  3 mL Intravenous Q12H    OutpatientDialysis Orders:[facility]  Assessment/Plan: 1. 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 Interested in [place] 2. ESRD- tolerating dialysis well. K+ controlled, no symptoms of uremia. Continue dialysis on MWF schedule. 3. HTN/ volume -No significant volume overload on exam. BP adequately controlled. Continue amlodipine. Had net UF 2L on 06/09/20.  4. DM2 - peradmitting team. 5. Anemia ckd -Hgb down to 8.7 post op. Outpatient ESA records requested. 6. MBD ckd -Calcium controlled, phos slightly high. Continue renvela and follow.   Anice Paganini, PA-C 06/11/2020, 8:11 AM  [facility] Pager: [phone]

## 2020-06-11 NOTE — Progress Notes (Signed)
PROGRESS NOTE    Patient: Christy Zhang                            PCP: Claretta Fraise, MD                    DOB: October 17, 1955            DOA: 06/08/2020 [id number]             DOS: 06/11/2020, 10:14 AM   LOS: 3 days   Date of Service: The patient was seen and examined on 06/11/2020  Subjective:   POD # 3 Left BKA Status post hemodialysis 06/09/2020 -tolerated The patient was seen and examined this morning, stable no acute distress no issues overnight  Brief Narrative:    Christy Zhang is a 65 y.o. female with medical history significant of ESRD on HD, IDDM, HTN, HLD, PVD, and gangrene requiring prior amputation presents for nonhealing left transmetatarsal amputation.   Patient had been hospitalized in 01/2020 after being found to have gangrene of her left toes.  She underwent angioplasty of the left anterior tibial artery on 02/23/2020, but subsequently underwent transmetatarsal amputation of the left the following day.  Patient had home health coming out to do dressing changes twice a week.  However, when the patient follow back up with vascular surgery it was noted that the wound was not healing appropriately and it was recommended that she have a below-knee amputation.  Surgery had initially been scheduled for 04/11/2020, but patient wanted to have a second opinion with the wound care doctor.  After being evaluated by the wound care specialist patient was advised that it likely would not heal.  Surgery has been postponed after patient developed Covid on 05/02/2020, and then subsequently on 05/16/2020 due to incliment weather.    On 06/08/2020 the  patient underwent below-knee amputation of the left leg with Dr. Stanford Breed with less than 50 mL of blood loss..  Labs prior to the procedure were significant for WBC 17.1, hemoglobin 9.4, platelets 646, BUN 17, creatinine 6.21, and albumin 2.5.   Assessment & Plan:   Principal Problem:   S/P BKA (below knee amputation) unilateral  (HCC) Active Problems:   ESRD (end stage renal disease) ([place])   Controlled type 2 diabetes mellitus with chronic kidney disease on chronic dialysis, with long-term current use of insulin (HCC)   Essential (primary) hypertension   Obesity, Class III, BMI 40-49.9 (morbid obesity) ([place])   Gangrene (HCC)    Assessment/Plan  Gangrene of the left forefoot  POD # 3 Left BKA -Stable no distress. -  nonhealing transmetatarsal amputation of the left foot with gangrene.   - Left below-knee amputation performed by Dr. Stanford Breed of vascular  - S/P left below-knee amputation: 06/08/2020  -Continue preop  IV ABx Cefazolin .Marland Kitchen Empirically due to leukocytosis -Continue with pain management, oxycodone, as needed IV narcotics -Postop care per vascular -PT/OT, CIR consulted   Leukocytosis:  -Monitoring, improving -No signs of sepsis afebrile normotensive - WBC elevated at 17.1 >> 22.2  >>> 18.4 -Following blood cultures >>> no growth to date   ESRD on HD:  -Patient normally dialyzes Monday, Wednesday, and Friday.   -Hemodialysis on 06/09/2020 >> plan for dialysis tomorrow -Nephrology following closely appreciate input   Essential hypertension:  -Monitoring BP closely -Patient blood pressure remained stable -Continue home meds:  Home blood pressure medications include metoprolol succinate 100 mg daily and  amlodipine 10 mg nightly. -Presumed amlodipine 10 mg daily -Resolved bradycardia, resume Toprol-XL at 50 mg   Anemia of chronic disease -with chronic kidney disease  -Monitoring H&H  Estimated blood loss less than 50 mL -Hemoglobin 10.5, 9.5, 8.7 -No signs of bleeding   Diabetes mellitus type II,  -Seems to be well controlled -Check an A1c 5.8, last A1c 6.4 12/29/2019 -Checking CBG QA CHS, SSI coverage -  Home regimen includes glargine 100 units daily and Trulicity 1.5 mg weekly. -Continue with reduced  home Lantus 20 unit units daily while in the hospital given more controlled  diet  PVD -Continue Plavix, aspirin and statin -Remained stable, vascular following  Hyperlipidemia -Continue home atorvastatin 40 mg nightly -Stable,  Hypoalbuminemia: Acute.   Albumin noted 2.5, 2.2  -Monitoring replete as needed  Morbid obesity BMI 40.34 kg/m -Patient has been counseled regarding aggressive weight loss -Dietary education -Increase activity postop  ------------------------------------------------------------------------------------------------------------------------------------ Nutritional status:  The patient's BMI is: Body mass index is 40.15 kg/m. I agree with the assessment and plan as outlined below:  Skin Assessment: I have examined the patient's skin and I agree with the wound assessment as performed by wound care team As outlined ....      ------------------------------------------------------------------------------------------------------------------------------------ Cultures; Blood Cultures x 2 >> NGTD  Antimicrobials: 06/08/2020 Ancef >>>   Consultants: Vascular surgery and nephrology  ------------------------------------------------------------------------------------------------------------------------------------  DVT prophylaxis:  Heparin SQ Code Status:   Code Status: Full Code  Family Communication: Discussed with patient in detail no family member present at bedside-  -Advance care planning has been discussed.   Admission status:   Status is: Inpatient Level of care: Telemetry Medical  Remains inpatient appropriate because:Inpatient level of care appropriate due to severity of illness   Dispo: The patient is from: Home              Anticipated d/c is to: Status post PT OT evaluation, CIR recommended, consultation placed              Anticipated d/c date is: Likely CIR in 1 to 2 days              Patient currently is not medically stable to d/c.   Difficult to place patient Yes multiple, reviewed, amputation,  generalized weakness     Procedures:   AMPUTATION BELOW KNEE     Antimicrobials:  Anti-infectives (From admission, onward)   Start     Dose/Rate Route Frequency Ordered Stop   06/10/20 1230  ceFAZolin (ANCEF) IVPB 1 g/50 mL premix        1 g 100 mL/hr over 30 Minutes Intravenous Every 24 hours 06/09/20 1507 06/12/20 1229   06/09/20 1000  ceFAZolin (ANCEF) IVPB 1 g/50 mL premix  Status:  Discontinued        1 g 100 mL/hr over 30 Minutes Intravenous Every 12 hours 06/09/20 0723 06/09/20 1507   06/08/20 0549  ceFAZolin (ANCEF) IVPB 2g/100 mL premix        2 g 200 mL/hr over 30 Minutes Intravenous 30 min pre-op 06/08/20 0549 06/08/20 0809       Medication:  . amLODipine  10 mg Oral QHS  . aspirin EC  81 mg Oral QHS  . atorvastatin  40 mg Oral QHS  . Chlorhexidine Gluconate Cloth  6 each Topical Q0600  . clopidogrel  75 mg Oral Daily  . heparin  5,000 Units Subcutaneous Q8H  . insulin aspart  0-15 Units Subcutaneous TID WC  . insulin glargine  20  Units Subcutaneous Daily  . metoprolol succinate  50 mg Oral Daily  . multivitamin  1 tablet Oral QHS  . sevelamer carbonate  2,400 mg Oral TID WC  . sodium chloride flush  3 mL Intravenous Q12H    acetaminophen **OR** acetaminophen, diphenhydrAMINE, oxyCODONE-acetaminophen, sevelamer carbonate   Objective:   Vitals:   06/10/20 1400 06/10/20 1540 06/10/20 2021 06/11/20 0500  BP: (!) 154/77 (!) 148/67 (!) 142/61 (!) 144/64  Pulse: 79 75 79 76  Resp: '16 17 17 17  '$ Temp: 98 F (36.7 C) 98 F (36.7 C) 98.3 F (36.8 C) 98.8 F (37.1 C)  TempSrc: Oral Oral Oral Axillary  SpO2: 97% 97% 95% 96%  Weight:    106.1 kg  Height:        Intake/Output Summary (Last 24 hours) at 06/11/2020 1014 Last data filed at 06/10/2020 1534 Gross per 24 hour  Intake 50 ml  Output --  Net 50 ml   Filed Weights   06/09/20 1025 06/10/20 0500 06/11/20 0500  Weight: 103 kg 102.7 kg 106.1 kg     Examination:         Physical  Exam:   General:  Alert, oriented, cooperative, no distress;   HEENT:  Normocephalic, PERRL, otherwise with in Normal limits   Neuro:  CNII-XII intact. , normal motor and sensation, reflexes intact   Lungs:   Clear to auscultation BL, Respirations unlabored, no wheezes / crackles  Cardio:    S1/S2, RRR, No murmure, No Rubs or Gallops   Abdomen:   Soft, non-tender, bowel sounds active all four quadrants,  no guarding or peritoneal signs.  Muscular skeletal:   Left BKA-surgical wound, dressing in place  Limited exam - in bed, able to move all 4 extremities, Normal strength,  2+ pulses,  symmetric, No pitting edema  Skin:  Dry, warm to touch, negative for any Rashes, right upper arm dialysis shunt positive for bruit  Wounds: Please see nursing documentation -  -left BKA postsurgical wound dressing and immobilizer in place        '            ------------------------------------------------------------------------------------------------------------------------------------------    LABs:  CBC Latest Ref Rng & Units 06/11/2020 06/10/2020 06/09/2020  WBC 4.0 - 10.5 K/uL 18.4(H) 24.2(H) 22.2(H)  Hemoglobin 12.0 - 15.0 g/dL 8.7(L) 9.0(L) 8.7(L)  Hematocrit 36.0 - 46.0 % 28.5(L) 29.8(L) 29.2(L)  Platelets 150 - 400 K/uL 619(H) 675(H) 631(H)   CMP Latest Ref Rng & Units 06/11/2020 06/10/2020 06/09/2020  Glucose 70 - 99 mg/dL 123(H) 169(H) 148(H)  BUN 8 - 23 mg/dL 39(H) 25(H) 25(H)  Creatinine 0.44 - 1.00 mg/dL 8.64(H) 6.88(H) 7.63(H)  Sodium 135 - 145 mmol/L 132(L) 134(L) 134(L)  Potassium 3.5 - 5.1 mmol/L 4.2 4.1 4.3  Chloride 98 - 111 mmol/L 91(L) 94(L) 93(L)  CO2 22 - 32 mmol/L '29 29 28  '$ Calcium 8.9 - 10.3 mg/dL 7.9(L) 8.4(L) 8.2(L)  Total Protein 6.5 - 8.1 g/dL - - -  Total Bilirubin 0.3 - 1.2 mg/dL - - -  Alkaline Phos 38 - 126 U/L - - -  AST 15 - 41 U/L - - -  ALT 0 - 44 U/L - - -       Micro Results Recent Results (from the past 240 hour(s))  Culture, blood (routine x  2)     Status: None (Preliminary result)   Collection Time: 06/09/20 11:21 AM   Specimen: BLOOD LEFT HAND  Result Value Ref Range Status  Specimen Description BLOOD LEFT HAND  Final   Special Requests   Final    BOTTLES DRAWN AEROBIC ONLY Blood Culture results may not be optimal due to an inadequate volume of blood received in culture bottles   Culture   Final    NO GROWTH 1 DAY Performed at [facility], [place] [address], [place], [place] [address]    Report Status PENDING  Incomplete  Culture, blood (routine x 2)     Status: None (Preliminary result)   Collection Time: 06/09/20 11:40 AM   Specimen: BLOOD LEFT HAND  Result Value Ref Range Status   Specimen Description BLOOD LEFT HAND  Final   Special Requests   Final    BOTTLES DRAWN AEROBIC ONLY Blood Culture results may not be optimal due to an inadequate volume of blood received in culture bottles   Culture   Final    NO GROWTH 1 DAY Performed at [facility], [place] [address] [place], [place] [address]    Report Status PENDING  Incomplete    Radiology Reports No results found.  SIGNED: Deatra James, MD, FHM. [facility],  Pager (please use [email or web address] to page/text) Please use Epic Secure Chat for non-urgent communication (7AM-7PM)  If 7PM-7AM, please contact night-coverage [email or web address], 06/11/2020, 10:14 AM

## 2020-06-12 LAB — CBC
HCT: 27.7 % — ABNORMAL LOW (ref 36.0–46.0)
Hemoglobin: 8.8 g/dL — ABNORMAL LOW (ref 12.0–15.0)
MCH: 25.9 pg — ABNORMAL LOW (ref 26.0–34.0)
MCHC: 31.8 g/dL (ref 30.0–36.0)
MCV: 81.5 fL (ref 80.0–100.0)
Platelets: 654 10*3/uL — ABNORMAL HIGH (ref 150–400)
RBC: 3.4 MIL/uL — ABNORMAL LOW (ref 3.87–5.11)
RDW: 17.2 % — ABNORMAL HIGH (ref 11.5–15.5)
WBC: 16.1 10*3/uL — ABNORMAL HIGH (ref 4.0–10.5)
nRBC: 0 % (ref 0.0–0.2)

## 2020-06-12 LAB — RENAL FUNCTION PANEL
Albumin: 2.1 g/dL — ABNORMAL LOW (ref 3.5–5.0)
Anion gap: 14 (ref 5–15)
BUN: 56 mg/dL — ABNORMAL HIGH (ref 8–23)
CO2: 25 mmol/L (ref 22–32)
Calcium: 8 mg/dL — ABNORMAL LOW (ref 8.9–10.3)
Chloride: 91 mmol/L — ABNORMAL LOW (ref 98–111)
Creatinine, Ser: 9.78 mg/dL — ABNORMAL HIGH (ref 0.44–1.00)
GFR, Estimated: 4 mL/min — ABNORMAL LOW (ref >60)
Glucose, Bld: 72 mg/dL (ref 70–99)
Phosphorus: 5.5 mg/dL — ABNORMAL HIGH (ref 2.5–4.6)
Potassium: 4.8 mmol/L (ref 3.5–5.1)
Sodium: 130 mmol/L — ABNORMAL LOW (ref 135–145)

## 2020-06-12 LAB — GLUCOSE, CAPILLARY
Glucose-Capillary: 75 mg/dL (ref 70–99)
Glucose-Capillary: 118 mg/dL — ABNORMAL HIGH (ref 70–99)
Glucose-Capillary: 163 mg/dL — ABNORMAL HIGH (ref 70–99)
Glucose-Capillary: 230 mg/dL — ABNORMAL HIGH (ref 70–99)

## 2020-06-12 MED ORDER — PROSOURCE PLUS PO LIQD
30.0000 mL | Freq: Two times a day (BID) | ORAL | Status: DC
  Administered 2020-06-12 – 2020-06-16 (×8): 30 mL via ORAL
  Filled 2020-06-12 (×9): qty 30

## 2020-06-12 NOTE — Progress Notes (Signed)
Inpatient Rehab Admissions Coordinator:  Saw pt at bedside. Informed her that there are no beds available in CIR today. Will continue to follow.  Gayland Curry, [place], [place] Admissions Coordinator [phone]

## 2020-06-12 NOTE — Progress Notes (Addendum)
  Progress Note    06/12/2020 8:07 AM 4 Days Post-Op  Subjective:  No complaints.  Pain controlled with p.o. medication   Vitals:   06/11/20 1200 06/12/20 0456  BP: (!) 149/69 (!) 160/67  Pulse: 67 67  Resp: 16 17  Temp: 97.8 F (36.6 C) 98.4 F (36.9 C)  SpO2: 98%     Physical Exam: Incisions:  L BKA incision healing well; minimal edema; sanguinous collection on dressing from anteriolateral aspect of incision   CBC    Component Value Date/Time   WBC 16.1 (H) 06/12/2020 0405   RBC 3.40 (L) 06/12/2020 0405   HGB 8.8 (L) 06/12/2020 0405   HGB 9.6 (L) 04/06/2020 1113   HCT 27.7 (L) 06/12/2020 0405   HCT 31.5 (L) 04/06/2020 1113   PLT 654 (H) 06/12/2020 0405   PLT 701 (H) 04/06/2020 1113   MCV 81.5 06/12/2020 0405   MCV 84 04/06/2020 1113   MCH 25.9 (L) 06/12/2020 0405   MCHC 31.8 06/12/2020 0405   RDW 17.2 (H) 06/12/2020 0405   RDW 15.5 (H) 04/06/2020 1113   LYMPHSABS 2.1 06/10/2020 1212   LYMPHSABS 2.3 04/06/2020 1113   MONOABS 1.2 (H) 06/10/2020 1212   EOSABS 0.1 06/10/2020 1212   EOSABS 0.7 (H) 04/06/2020 1113   BASOSABS 0.1 06/10/2020 1212   BASOSABS 0.1 04/06/2020 1113    BMET    Component Value Date/Time   NA 130 (L) 06/12/2020 0405   NA 136 04/06/2020 1113   K 4.8 06/12/2020 0405   CL 91 (L) 06/12/2020 0405   CO2 25 06/12/2020 0405   GLUCOSE 72 06/12/2020 0405   BUN 56 (H) 06/12/2020 0405   BUN 17 04/06/2020 1113   CREATININE 9.78 (H) 06/12/2020 0405   CALCIUM 8.0 (L) 06/12/2020 0405   GFRNONAA 4 (L) 06/12/2020 0405   GFRAA 8 (L) 04/06/2020 1113    INR    Component Value Date/Time   INR 1.2 06/08/2020 0550    No intake or output data in the 24 hours ending 06/12/20 0807   Assessment/Plan:  65 y.o. female is s/p left below knee amputation  4 Days Post-Op  - L BKA seems to be healing well - Appt for staple removal on 3/15 - Ok for discharge from vascular standpoint   Dagoberto Ligas, PA-C [facility] [phone] 06/12/2020 8:07 AM  VASCULAR STAFF ADDENDUM: I have independently interviewed and examined the patient. I agree with the above.  Ready for next level of care. Please call for questions.  Yevonne Aline. Stanford Breed, MD [facility] Phone Number: [phone] 06/12/2020 1:22 PM

## 2020-06-12 NOTE — Progress Notes (Signed)
Occupational Therapy Treatment Patient Details Name: Christy Zhang MRN: [id number] DOB: [birth year] Today's Date: 06/12/2020    History of present illness 65 yo female presents non healing TMA and underwent L BKA on 06/08/20, also being treated for leukocytosis.  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   OT comments  Pt making good progress with functional goals. OT will continue to follow acutely to maximize level of function and safety  Follow Up Recommendations  CIR    Equipment Recommendations  3 in 1 bedside commode;Other (comment) (TBD at CIR)    Recommendations for Other Services      Precautions / Restrictions Precautions Precautions: Fall Required Braces or Orthoses: Knee Immobilizer - Left Knee Immobilizer - Left: On at all times Restrictions Weight Bearing Restrictions: Yes LLE Weight Bearing: Non weight bearing       Mobility Bed Mobility Overal bed mobility: Needs Assistance Bed Mobility: Supine to Sit     Supine to sit: Min guard     General bed mobility comments: pt in recliner upon arrival  Transfers Overall transfer level: Needs assistance Equipment used: Rolling walker (2 wheeled) Transfers: Sit to/from Stand Sit to Stand: Mod assist Stand pivot transfers: Min assist       General transfer comment: Cues for hand placement    Balance Overall balance assessment: Needs assistance Sitting-balance support: Bilateral upper extremity supported Sitting balance-Leahy Scale: Good     Standing balance support: Bilateral upper extremity supported;During functional activity Standing balance-Leahy Scale: Poor Standing balance comment: reliant on UE support                           ADL either performed or assessed with clinical judgement   ADL  Overall ADL's : Needs assistance/impaired Eating/Feeding: Set up;Independent;Sitting   Grooming: Wash/dry hands;Wash/dry face;Sitting;Supervision/safety;Set up   Upper Body Bathing: Supervision/ safety;Set up;Sitting Upper Body Bathing Details (indicate cue type and reason): simulated seated in recliner Lower Body Bathing: Sitting/lateral leans;Moderate assistance Lower Body Bathing Details (indicate cue type and reason): simulated seated in recliner Upper Body Dressing : Supervision/safety;Set up;Sitting Upper Body Dressing Details (indicate cue type and reason): donned clean gown     Toilet Transfer: Moderate assistance;Minimal assistance;Stand-pivot;RW;Cueing for safety;BSC   Toileting- Clothing Manipulation and Hygiene: Minimal assistance;Sitting/lateral lean       Functional mobility during ADLs: Rolling walker;Cueing for safety;Moderate assistance;Minimal assistance       Vision Baseline Vision/History: Wears glasses Wears Glasses: Reading only Patient Visual Report: No change from baseline     Perception     Praxis      Cognition Arousal/Alertness: Awake/alert Behavior During Therapy: WFL for tasks assessed/performed Overall Cognitive Status: Within Functional Limits for tasks assessed                                          Exercises General Exercises - Lower Extremity Ankle Circles/Pumps: AROM;Right;Supine;20 reps Quad Sets: AROM;Both;10 reps;Supine Heel Slides: AROM;Right;10 reps;Supine Hip ABduction/ADduction: AROM;Both;10 reps;Supine Straight Leg Raises: AROM;Both;10 reps;Supine   Shoulder Instructions       General Comments      Pertinent Vitals/ Pain       Pain Assessment: Faces Faces Pain Scale:  Hurts a little bit Pain Location: L LE Pain Descriptors / Indicators: Grimacing;Guarding Pain Intervention(s): Monitored during session;Repositioned  Home Living                                          Prior  Functioning/Environment              Frequency  Min 2X/week        Progress Toward Goals  OT Goals(current goals can now be found in the care plan section)  Progress towards OT goals: Progressing toward goals  Acute Rehab OT Goals Patient Stated Goal: go home  Plan Discharge plan remains appropriate    Co-evaluation                 AM-PAC OT "6 Clicks" Daily Activity     Outcome Measure   Help from another person eating meals?: None Help from another person taking care of personal grooming?: A Little Help from another person toileting, which includes using toliet, bedpan, or urinal?: A Little Help from another person bathing (including washing, rinsing, drying)?: A Lot Help from another person to put on and taking off regular upper body clothing?: A Little Help from another person to put on and taking off regular lower body clothing?: A Lot 6 Click Score: 17    End of Session Equipment Utilized During Treatment: Gait belt;Rolling walker;Other (comment) (BSC)  OT Visit Diagnosis: Unsteadiness on feet (R26.81);Other abnormalities of gait and mobility (R26.89);Pain Pain - Right/Left: Left Pain - part of body: Leg   Activity Tolerance Patient tolerated treatment well   Patient Left in chair;with call bell/phone within reach;with chair alarm set   Nurse Communication          Time: [id number] OT Time Calculation (min): 27 min  Charges: OT General Charges $OT Visit: 1 Visit OT Treatments $Self Care/Home Management : 143-157 mins     Britt Bottom 06/12/2020, 2:00 PM

## 2020-06-12 NOTE — Plan of Care (Signed)
  Problem: Health Behavior/Discharge Planning: Goal: Ability to manage health-related needs will improve Outcome: Progressing   Problem: Clinical Measurements: Goal: Ability to maintain clinical measurements within normal limits will improve Outcome: Progressing Goal: Will remain free from infection Outcome: Progressing   

## 2020-06-12 NOTE — Progress Notes (Signed)
PROGRESS NOTE    Patient: Christy Zhang                            PCP: Claretta Fraise, MD                    DOB: 05-30-1955            DOA: 06/08/2020 [id number]             DOS: 06/12/2020, 10:31 AM   LOS: 4 days   Date of Service: The patient was seen and examined on 06/12/2020  Subjective:   POD # 4 Left BKA Patient seen and examined.  She has no complaints.  She is looking forward to her dialysis today.  Brief Narrative:    Christy Zhang is a 65 y.o. female with medical history significant of ESRD on HD, IDDM, HTN, HLD, PVD, and gangrene requiring prior amputation presents for nonhealing left transmetatarsal amputation.   Patient had been hospitalized in 01/2020 after being found to have gangrene of her left toes.  She underwent angioplasty of the left anterior tibial artery on 02/23/2020, but subsequently underwent transmetatarsal amputation of the left the following day.  Patient had home health coming out to do dressing changes twice a week.  However, when the patient follow back up with vascular surgery it was noted that the wound was not healing appropriately and it was recommended that she have a below-knee amputation.  Surgery had initially been scheduled for 04/11/2020, but patient wanted to have a second opinion with the wound care doctor.  After being evaluated by the wound care specialist patient was advised that it likely would not heal.  Surgery has been postponed after patient developed Covid on 05/02/2020, and then subsequently on 05/16/2020 due to incliment weather.    On 06/08/2020 the  patient underwent below-knee amputation of the left leg with Dr. Stanford Breed with less than 50 mL of blood loss..  Labs prior to the procedure were significant for WBC 17.1, hemoglobin 9.4, platelets 646, BUN 17, creatinine 6.21, and albumin 2.5.   Assessment & Plan:   Principal Problem:   S/P BKA (below knee amputation) unilateral (HCC) Active Problems:   ESRD (end stage renal  disease) ([place])   Controlled type 2 diabetes mellitus with chronic kidney disease on chronic dialysis, with long-term current use of insulin (HCC)   Essential (primary) hypertension   Obesity, Class III, BMI 40-49.9 (morbid obesity) ([place])   Gangrene (HCC)    Assessment/Plan  Gangrene of the left forefoot  POD # 4 Left BKA -Stable no distress. -  nonhealing transmetatarsal amputation of the left foot with gangrene.   - Left below-knee amputation performed by Dr. Stanford Breed of vascular  - S/P left below-knee amputation: 06/08/2020.  Doing well.  No complaints. -Continue with pain management, oxycodone, as needed IV narcotics -Postop care per vascular -PT/OT recommends CIR, CIR consulted  Leukocytosis:  -Monitoring, improving -No signs of sepsis afebrile normotensive - WBC elevated at 17.1 >> 22.2  >>> 18.4> 16.1 -Following blood cultures >>> no growth to date  ESRD on HD:  -Patient normally dialyzes Monday, Wednesday, and Friday.   Dialysis per nephrology.  Appreciate help.  Essential hypertension:  Home blood pressure medications include metoprolol succinate 100 mg daily and amlodipine 10 mg nightly.  Reportedly had some issues with the bradycardia so Toprol-XL was discontinued.  Later it was resumed back at lower dose of 50  mg p.o. daily.  She also remains on amlodipine.  Blood pressure better controlled.  Anemia of chronic disease -with chronic kidney disease  Hemoglobin is stable.  Diabetes mellitus type II,  -Seems to be well controlled -Check an A1c 5.8, last A1c 6.4 12/29/2019 -Checking CBG QA CHS, SSI coverage -  Home regimen includes glargine 100 units daily and Trulicity 1.5 mg weekly. -Continue with reduced  home Lantus 20 unit units daily while in the hospital given more controlled diet  PVD -Continue Plavix, aspirin and statin -Remained stable, vascular following  Hyperlipidemia -Continue home atorvastatin 40 mg nightly -Stable,  Hypoalbuminemia: Acute.    Albumin noted 2.5, 2.2  -Monitoring replete as needed  Morbid obesity BMI 40.34 kg/m -Patient has been counseled regarding aggressive weight loss -Dietary education -Increase activity postop  ------------------------------------------------------------------------------------------------------------------------------------ Nutritional status:  The patient's BMI is: Body mass index is 40.18 kg/m. I agree with the assessment and plan as outlined below:  Skin Assessment: I have examined the patient's skin and I agree with the wound assessment as performed by wound care team As outlined ....      ------------------------------------------------------------------------------------------------------------------------------------ Cultures; Blood Cultures x 2 >> NGTD  Antimicrobials: 06/08/2020 Ancef >>> 06/10/2020  Consultants: Vascular surgery and nephrology  ------------------------------------------------------------------------------------------------------------------------------------  DVT prophylaxis:  Heparin SQ Code Status:   Code Status: Full Code  Family Communication: Discussed with patient in detail no family member present at bedside-  -Advance care planning has been discussed.   Admission status:   Status is: Inpatient Level of care: Telemetry Medical  Remains inpatient appropriate because:Inpatient level of care appropriate due to severity of illness   Dispo: The patient is from: Home              Anticipated d/c is to: Status post PT OT evaluation, CIR recommended, waiting for placement to CIR.              Anticipated d/c date is: Likely CIR in 1 to 2 days              Patient currently is medically stable to d/c.   Difficult to place patient No     Procedures:   AMPUTATION BELOW KNEE     Antimicrobials:  Anti-infectives (From admission, onward)   Start     Dose/Rate Route Frequency Ordered Stop   06/10/20 1230  ceFAZolin (ANCEF) IVPB 1  g/50 mL premix        1 g 100 mL/hr over 30 Minutes Intravenous Every 24 hours 06/09/20 1507 06/11/20 1243   06/09/20 1000  ceFAZolin (ANCEF) IVPB 1 g/50 mL premix  Status:  Discontinued        1 g 100 mL/hr over 30 Minutes Intravenous Every 12 hours 06/09/20 0723 06/09/20 1507   06/08/20 0549  ceFAZolin (ANCEF) IVPB 2g/100 mL premix        2 g 200 mL/hr over 30 Minutes Intravenous 30 min pre-op 06/08/20 0549 06/08/20 0809       Medication:  . amLODipine  10 mg Oral QHS  . aspirin EC  81 mg Oral QHS  . atorvastatin  40 mg Oral QHS  . Chlorhexidine Gluconate Cloth  6 each Topical Q0600  . clopidogrel  75 mg Oral Daily  . heparin  5,000 Units Subcutaneous Q8H  . insulin aspart  0-15 Units Subcutaneous TID WC  . insulin glargine  20 Units Subcutaneous Daily  . metoprolol succinate  50 mg Oral Daily  . multivitamin  1 tablet Oral QHS  .  polyethylene glycol  17 g Oral Daily  . senna-docusate  1 tablet Oral BID  . sevelamer carbonate  2,400 mg Oral TID WC  . sodium chloride flush  3 mL Intravenous Q12H    acetaminophen **OR** acetaminophen, diphenhydrAMINE, oxyCODONE-acetaminophen, sevelamer carbonate   Objective:   Vitals:   06/11/20 0500 06/11/20 1200 06/12/20 0456 06/12/20 0500  BP: (!) 144/64 (!) 149/69 (!) 160/67   Pulse: 76 67 67   Resp: '17 16 17   '$ Temp: 98.8 F (37.1 C) 97.8 F (36.6 C) 98.4 F (36.9 C)   TempSrc: Axillary Oral Oral   SpO2: 96% 98%    Weight: 106.1 kg   106.2 kg  Height:       No intake or output data in the 24 hours ending 06/12/20 1031 Filed Weights   06/10/20 0500 06/11/20 0500 06/12/20 0500  Weight: 102.7 kg 106.1 kg 106.2 kg     Examination:   General exam: Appears calm and comfortable  Respiratory system: Clear to auscultation. Respiratory effort normal. Cardiovascular system: S1 & S2 heard, RRR. No JVD, murmurs, rubs, gallops or clicks. No pedal edema. Gastrointestinal system: Abdomen is nondistended, soft and nontender. No  organomegaly or masses felt. Normal bowel sounds heard. Central nervous system: Alert and oriented. No focal neurological deficits. Extremities: Left BKA Skin: No rashes, lesions or ulcers.  Psychiatry: Judgement and insight appear normal. Mood & affect appropriate.   ------------------------------------------------------------------------------------------------------------------------------------------    LABs:  CBC Latest Ref Rng & Units 06/12/2020 06/11/2020 06/10/2020  WBC 4.0 - 10.5 K/uL 16.1(H) 18.4(H) 24.2(H)  Hemoglobin 12.0 - 15.0 g/dL 8.8(L) 8.7(L) 9.0(L)  Hematocrit 36.0 - 46.0 % 27.7(L) 28.5(L) 29.8(L)  Platelets 150 - 400 K/uL 654(H) 619(H) 675(H)   CMP Latest Ref Rng & Units 06/12/2020 06/11/2020 06/10/2020  Glucose 70 - 99 mg/dL 72 123(H) 169(H)  BUN 8 - 23 mg/dL 56(H) 39(H) 25(H)  Creatinine 0.44 - 1.00 mg/dL 9.78(H) 8.64(H) 6.88(H)  Sodium 135 - 145 mmol/L 130(L) 132(L) 134(L)  Potassium 3.5 - 5.1 mmol/L 4.8 4.2 4.1  Chloride 98 - 111 mmol/L 91(L) 91(L) 94(L)  CO2 22 - 32 mmol/L '25 29 29  '$ Calcium 8.9 - 10.3 mg/dL 8.0(L) 7.9(L) 8.4(L)  Total Protein 6.5 - 8.1 g/dL - - -  Total Bilirubin 0.3 - 1.2 mg/dL - - -  Alkaline Phos 38 - 126 U/L - - -  AST 15 - 41 U/L - - -  ALT 0 - 44 U/L - - -       Micro Results Recent Results (from the past 240 hour(s))  Culture, blood (routine x 2)     Status: None (Preliminary result)   Collection Time: 06/09/20 11:21 AM   Specimen: BLOOD LEFT HAND  Result Value Ref Range Status   Specimen Description BLOOD LEFT HAND  Final   Special Requests   Final    BOTTLES DRAWN AEROBIC ONLY Blood Culture results may not be optimal due to an inadequate volume of blood received in culture bottles   Culture   Final    NO GROWTH 2 DAYS Performed at [facility], [place] [address], [place], [place] [address]    Report Status PENDING  Incomplete  Culture, blood (routine x 2)     Status: None (Preliminary result)   Collection Time: 06/09/20  11:40 AM   Specimen: BLOOD LEFT HAND  Result Value Ref Range Status   Specimen Description BLOOD LEFT HAND  Final   Special Requests   Final  BOTTLES DRAWN AEROBIC ONLY Blood Culture results may not be optimal due to an inadequate volume of blood received in culture bottles   Culture   Final    NO GROWTH 2 DAYS Performed at [facility], [place] [address] [place], [place] [address]    Report Status PENDING  Incomplete    Radiology Reports No results found.  SIGNED: Darliss Cheney, MD,  [facility],  Pager (please use [email or web address] to page/text) Please use Epic Secure Chat for non-urgent communication (7AM-7PM)  If 7PM-7AM, please contact night-coverage [email or web address], 06/12/2020, 10:31 AM

## 2020-06-12 NOTE — Progress Notes (Signed)
[facility] Progress Note   Subjective:   Patient seen and examined at bedside.  Feeling pretty good.  Denies pain. Waiting to see if bed available in CIR.  Denies CP, SOB, n/v/d, abdominal pain, weakness, and fatigue.  Admits to constipation and waiting for medicine she was given earlier to work.   Objective Vitals:   06/11/20 0500 06/11/20 1200 06/12/20 0456 06/12/20 0500  BP: (!) 144/64 (!) 149/69 (!) 160/67   Pulse: 76 67 67   Resp: '17 16 17   '$ Temp: 98.8 F (37.1 C) 97.8 F (36.6 C) 98.4 F (36.9 C)   TempSrc: Axillary Oral Oral   SpO2: 96% 98%    Weight: 106.1 kg   106.2 kg  Height:       Physical Exam General:well developed female in NAD Heart:RRR, no mrg Lungs:CTAB, nml WOB Abdomen:soft, NTND Extremities:L BKA, no edema b/l Dialysis Access: RU AVG +b   Filed Weights   06/10/20 0500 06/11/20 0500 06/12/20 0500  Weight: 102.7 kg 106.1 kg 106.2 kg   Additional Objective Labs: Basic Metabolic Panel: Recent Labs  Lab 06/10/20 0210 06/11/20 0239 06/12/20 0405  NA 134* 132* 130*  K 4.1 4.2 4.8  CL 94* 91* 91*  CO2 '29 29 25  '$ GLUCOSE 169* 123* 72  BUN 25* 39* 56*  CREATININE 6.88* 8.64* 9.78*  CALCIUM 8.4* 7.9* 8.0*  PHOS 5.6* 6.0* 5.5*   Liver Function Tests: Recent Labs  Lab 06/08/20 0550 06/09/20 0151 06/10/20 0210 06/11/20 0239 06/12/20 0405  AST 10*  --   --   --   --   ALT 11  --   --   --   --   ALKPHOS 68  --   --   --   --   BILITOT 0.6  --   --   --   --   PROT 7.8  --   --   --   --   ALBUMIN 2.5*   < > 2.2* 2.0* 2.1*   < > = values in this interval not displayed.   CBC: Recent Labs  Lab 06/08/20 0550 06/08/20 0812 06/09/20 0151 06/10/20 1212 06/11/20 0239 06/12/20 0405  WBC 17.1*  --  22.2* 24.2* 18.4* 16.1*  NEUTROABS  --   --   --  20.6*  --   --   HGB 9.4*   < > 8.7* 9.0* 8.7* 8.8*  HCT 31.7*   < > 29.2* 29.8* 28.5* 27.7*  MCV 83.6  --  82.3 82.1 81.7 81.5  PLT 646*  --  631* 675* 619* 654*   < > = values  in this interval not displayed.   Blood Culture    Component Value Date/Time   SDES BLOOD LEFT HAND 06/09/2020 1140   SPECREQUEST  06/09/2020 1140    BOTTLES DRAWN AEROBIC ONLY Blood Culture results may not be optimal due to an inadequate volume of blood received in culture bottles   CULT  06/09/2020 1140    NO GROWTH 2 DAYS Performed at [facility], [place] [address], [place], [place] [address]    REPTSTATUS PENDING 06/09/2020 1140   CBG: Recent Labs  Lab 06/11/20 0647 06/11/20 1241 06/11/20 1740 06/11/20 2010 06/12/20 0855  GLUCAP 102* 110* 89 133* 75    Medications:  . amLODipine  10 mg Oral QHS  . aspirin EC  81 mg Oral QHS  . atorvastatin  40 mg Oral QHS  . Chlorhexidine Gluconate Cloth  6 each  Topical Q0600  . clopidogrel  75 mg Oral Daily  . heparin  5,000 Units Subcutaneous Q8H  . insulin aspart  0-15 Units Subcutaneous TID WC  . insulin glargine  20 Units Subcutaneous Daily  . metoprolol succinate  50 mg Oral Daily  . multivitamin  1 tablet Oral QHS  . polyethylene glycol  17 g Oral Daily  . senna-docusate  1 tablet Oral BID  . sevelamer carbonate  2,400 mg Oral TID WC  . sodium chloride flush  3 mL Intravenous Q12H    Dialysis Orders: [facility]  Assessment/Plan: 1. 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 Interested in [place] awaiting to see if bed placement available. Per [facility] ok for d/c.  2. Leukocytosis - improving, WBC 16.1. BC negative.  S/p pre OP ancef. 3. ESRD- on MWF. tolerating dialysis well.K+ controlled, last 4.8.  Scheduled for HD today per regular schedule.  4. HTN/ volume -BP elevated this AM. Continue amlodipine and metoprolol.Does not appear grossly volume overloaded.  Plan for UF 2-2.5L today, may increase if blood pressure remains elevated.   5. DM2 - peradmitting team. 6. Anemia ckd -Hgb stabilized around 8.8 now, post op. Outpatient ESA records requested. 7. MBD ckd -Calcium controlled, phos slightly high. Continue renvela  and follow. 8. Nutrition - Renal diet with fluid restrictions. Protein supplements, Vitamins.    Jen Mow, PA-C [facility] 06/12/2020,10:26 AM  LOS: 4 days

## 2020-06-12 NOTE — Progress Notes (Signed)
Physical Therapy Treatment Patient Details Name: Christy Zhang MRN: [id number] DOB: 13-Jan-1956 Today's Date: 06/12/2020    History of Present Illness 65 yo female presents non healing TMA and underwent L BKA on 06/08/20, also being treated for leukocytosis.  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    PT Comments    Pt supine in bed on arrival this session.  Pt required assistance to move into standing this session requiring min to mod assistance.  Pt continues to require aggressive rehab in a post acute setting to maximize functional gains before returning home.       Follow Up Recommendations  CIR     Equipment Recommendations  None recommended by PT    Recommendations for Other Services       Precautions / Restrictions Precautions Precautions: Fall Required Braces or Orthoses: Knee Immobilizer - Left Knee Immobilizer - Left: On at all times Restrictions Weight Bearing Restrictions: Yes LLE Weight Bearing: Non weight bearing    Mobility  Bed Mobility Overal bed mobility: Needs Assistance Bed Mobility: Supine to Sit     Supine to sit: Min guard     General bed mobility comments: Min guard to move to edge of bed with mod cues for use of bed rail.  Heavy reliance on rail to pull into sitting.    Transfers Overall transfer level: Needs assistance Equipment used: Rolling walker (2 wheeled) Transfers: Sit to/from Stand Sit to Stand: Mod assist         General transfer comment: Cues for hand placement to and from seated surface with mod assistance to boost into standing.  R knee buckle noted backing to seated surface causing poor eccentirc load.  Ambulation/Gait Ambulation/Gait assistance: Mod assist Gait Distance (Feet): 4 Feet Assistive device: Rolling walker (2  wheeled) Gait Pattern/deviations: Step-to pattern (hop to pattern.)     General Gait Details: Cues for sequencing and hop to pattern with assistance to move RW to back to seated surface.  Buckling noted during retro gt.   Stairs             Wheelchair Mobility    Modified Rankin (Stroke Patients Only)       Balance Overall balance assessment: Needs assistance Sitting-balance support: Bilateral upper extremity supported Sitting balance-Leahy Scale: Good       Standing balance-Leahy Scale: Poor Standing balance comment: reliant on UE support                            Cognition Arousal/Alertness: Awake/alert Behavior During Therapy: WFL for tasks assessed/performed Overall Cognitive Status: Within Functional Limits for tasks assessed                                        Exercises General Exercises - Lower Extremity Ankle Circles/Pumps: AROM;Right;Supine;20 reps Quad Sets: AROM;Both;10 reps;Supine Heel Slides: AROM;Right;10 reps;Supine Hip ABduction/ADduction: AROM;Both;10 reps;Supine Straight Leg Raises: AROM;Both;10 reps;Supine  IS x 10 reps 519m-750ml.    General Comments        Pertinent Vitals/Pain Pain Assessment: Faces Faces Pain Scale: Hurts a little bit Pain Location: L LE Pain Descriptors / Indicators: Grimacing Pain Intervention(s): Monitored during session;Repositioned  Home Living                      Prior Function            PT Goals (current goals can now be found in the care plan section) Acute Rehab PT Goals Patient Stated Goal: go home Potential to Achieve Goals: Good Progress towards PT goals: Progressing toward goals    Frequency    Min 3X/week      PT Plan Current plan remains appropriate    Co-evaluation              AM-PAC PT "6 Clicks" Mobility   Outcome Measure  Help needed turning from your back to your side while in a flat bed without using bedrails?: A  Little Help needed moving from lying on your back to sitting on the side of a flat bed without using bedrails?: A Little Help needed moving to and from a bed to a chair (including a wheelchair)?: A Lot Help needed standing up from a chair using your arms (e.g., wheelchair or bedside chair)?: A Lot Help needed to walk in hospital room?: A Little Help needed climbing 3-5 steps with a railing? : A Lot 6 Click Score: 15    End of Session Equipment Utilized During Treatment: Gait belt Activity Tolerance: Patient tolerated treatment well Patient left: with call bell/phone within reach;with chair alarm set;in chair Nurse Communication: Mobility status PT Visit Diagnosis: Unsteadiness on feet (R26.81);Muscle weakness (generalized) (M62.81);Other abnormalities of gait and mobility (R26.89)     Time: [id number] PT Time Calculation (min) (ACUTE ONLY): 20 min  Charges:  $Therapeutic Activity: 8-22 mins                     Erasmo Leventhal , PTA Acute Rehabilitation Services Pager [phone] Office [phone]     Christy Zhang 06/12/2020, 10:45 AM

## 2020-06-13 LAB — RENAL FUNCTION PANEL
Albumin: 2 g/dL — ABNORMAL LOW (ref 3.5–5.0)
Anion gap: 14 (ref 5–15)
BUN: 70 mg/dL — ABNORMAL HIGH (ref 8–23)
CO2: 22 mmol/L (ref 22–32)
Calcium: 7.9 mg/dL — ABNORMAL LOW (ref 8.9–10.3)
Chloride: 94 mmol/L — ABNORMAL LOW (ref 98–111)
Creatinine, Ser: 11.29 mg/dL — ABNORMAL HIGH (ref 0.44–1.00)
GFR, Estimated: 3 mL/min — ABNORMAL LOW (ref >60)
Glucose, Bld: 92 mg/dL (ref 70–99)
Phosphorus: 6.1 mg/dL — ABNORMAL HIGH (ref 2.5–4.6)
Potassium: 5.4 mmol/L — ABNORMAL HIGH (ref 3.5–5.1)
Sodium: 130 mmol/L — ABNORMAL LOW (ref 135–145)

## 2020-06-13 LAB — CBC
HCT: 26.1 % — ABNORMAL LOW (ref 36.0–46.0)
Hemoglobin: 8.1 g/dL — ABNORMAL LOW (ref 12.0–15.0)
MCH: 24.9 pg — ABNORMAL LOW (ref 26.0–34.0)
MCHC: 31 g/dL (ref 30.0–36.0)
MCV: 80.3 fL (ref 80.0–100.0)
Platelets: 598 10*3/uL — ABNORMAL HIGH (ref 150–400)
RBC: 3.25 MIL/uL — ABNORMAL LOW (ref 3.87–5.11)
RDW: 17.2 % — ABNORMAL HIGH (ref 11.5–15.5)
WBC: 15.5 10*3/uL — ABNORMAL HIGH (ref 4.0–10.5)
nRBC: 0 % (ref 0.0–0.2)

## 2020-06-13 LAB — GLUCOSE, CAPILLARY
Glucose-Capillary: 87 mg/dL (ref 70–99)
Glucose-Capillary: 121 mg/dL — ABNORMAL HIGH (ref 70–99)
Glucose-Capillary: 89 mg/dL (ref 70–99)
Glucose-Capillary: 107 mg/dL — ABNORMAL HIGH (ref 70–99)

## 2020-06-13 LAB — SURGICAL PATHOLOGY

## 2020-06-13 MED ORDER — ACETAMINOPHEN 325 MG PO TABS
ORAL_TABLET | ORAL | Status: AC
  Filled 2020-06-13: qty 2

## 2020-06-13 NOTE — Plan of Care (Signed)
?  Problem: Clinical Measurements: ?Goal: Will remain free from infection ?Outcome: Progressing ?Goal: Diagnostic test results will improve ?Outcome: Progressing ?Goal: Respiratory complications will improve ?Outcome: Progressing ?  ?

## 2020-06-13 NOTE — Progress Notes (Signed)
Inpatient Rehabilitation Admissions Coordinator  I met with patient at bedside. I await bed availability for a possible CIR admit. She is in agreement.  Danne Baxter, RN, MSN Rehab Admissions Coordinator [phone] 06/13/2020 12:24 PM

## 2020-06-13 NOTE — Progress Notes (Signed)
PROGRESS NOTE    Patient: Christy Zhang                            PCP: Christy Fraise, MD                    DOB: [birth year]            DOA: 06/08/2020 [id number]             DOS: 06/13/2020, 11:42 AM   LOS: 5 days   Date of Service: The patient was seen and examined on 06/13/2020  Subjective:   POD # 5 Left BKA Seen and examined today in dialysis unit.  She has no complaints.  Brief Narrative:    Christy Zhang is a 65 y.o. female with medical history significant of ESRD on HD, IDDM, HTN, HLD, PVD, and gangrene requiring prior amputation presents for nonhealing left transmetatarsal amputation.   Patient had been hospitalized in 01/2020 after being found to have gangrene of her left toes.  She underwent angioplasty of the left anterior tibial artery on 02/23/2020, but subsequently underwent transmetatarsal amputation of the left the following day.  Patient had home health coming out to do dressing changes twice a week.  However, when the patient follow back up with vascular surgery it was noted that the wound was not healing appropriately and it was recommended that she have a below-knee amputation.  Surgery had initially been scheduled for 04/11/2020, but patient wanted to have a second opinion with the wound care doctor.  After being evaluated by the wound care specialist patient was advised that it likely would not heal.  Surgery has been postponed after patient developed Covid on 05/02/2020, and then subsequently on 05/16/2020 due to incliment weather.    On 06/08/2020 the  patient underwent below-knee amputation of the left leg with Dr. Stanford Breed with less than 50 mL of blood loss..  Labs prior to the procedure were significant for WBC 17.1, hemoglobin 9.4, platelets 646, BUN 17, creatinine 6.21, and albumin 2.5.   Assessment & Plan:   Principal Problem:   S/P BKA (below knee amputation) unilateral (HCC) Active Problems:   ESRD (end stage renal disease) ([place])   Controlled type  2 diabetes mellitus with chronic kidney disease on chronic dialysis, with long-term current use of insulin (HCC)   Essential (primary) hypertension   Obesity, Class III, BMI 40-49.9 (morbid obesity) ([place])   Gangrene (HCC)    Assessment/Plan  Gangrene of the left forefoot  POD # 5 Left BKA -Stable no distress. -  nonhealing transmetatarsal amputation of the left foot with gangrene.   - Left below-knee amputation performed by Dr. Stanford Breed of vascular  - S/P left below-knee amputation: 06/08/2020.  Doing well.  No complaints. -Continue with pain management, oxycodone, as needed IV narcotics -Postop care per vascular -PT/OT recommends CIR, CIR consulted.  Waiting for bed availability.  Leukocytosis:  -Monitoring, improving -No signs of sepsis afebrile normotensive - WBC elevated at 17.1 >> 22.2  >>> 18.4> 16.1> 15.5 -Following blood cultures >>> no growth to date  ESRD on HD:  -Patient normally dialyzes Monday, Wednesday, and Friday.   Dialysis per nephrology.  Appreciate help.  Essential hypertension:  Home blood pressure medications include metoprolol succinate 100 mg daily and amlodipine 10 mg nightly.  Reportedly had some issues with the bradycardia so Toprol-XL was discontinued.  Later it was resumed back at lower dose of 50  mg p.o. daily.  She also remains on amlodipine.  Blood pressure better controlled.  Anemia of chronic disease -with chronic kidney disease  Hemoglobin is stable.  Diabetes mellitus type II,  -Seems to be well controlled -Check an A1c 5.8, last A1c 6.4 12/29/2019 -Checking CBG QA CHS, SSI coverage -  Home regimen includes glargine 100 units daily and Trulicity 1.5 mg weekly. -Continue with reduced  home Lantus 20 unit units daily while in the hospital given more controlled diet.  PVD -Continue Plavix, aspirin and statin -Remained stable, vascular following  Hyperlipidemia -Continue home atorvastatin 40 mg nightly -Stable,  Hypoalbuminemia: Acute.    Albumin noted 2.5, 2.2  -Monitoring replete as needed  Morbid obesity BMI 40.34 kg/m -Patient has been counseled regarding aggressive weight loss -Dietary education -Increase activity postop  ------------------------------------------------------------------------------------------------------------------------------------ Nutritional status:  The patient's BMI is: Body mass index is 38.75 kg/m. I agree with the assessment and plan as outlined below:  Skin Assessment: I have examined the patient's skin and I agree with the wound assessment as performed by wound care team As outlined ....      ------------------------------------------------------------------------------------------------------------------------------------ Cultures; Blood Cultures x 2 >> NGTD  Antimicrobials: 06/08/2020 Ancef >>> 06/10/2020  Consultants: Vascular surgery and nephrology  ------------------------------------------------------------------------------------------------------------------------------------  DVT prophylaxis:  Heparin SQ Code Status:   Code Status: Full Code  Family Communication: Discussed with patient in detail no family member present at bedside-  -Advance care planning has been discussed.   Admission status:   Status is: Inpatient Level of care: Telemetry Medical  Remains inpatient appropriate because:Inpatient level of care appropriate due to severity of illness   Dispo: The patient is from: Home              Anticipated d/c is to: Status post PT OT evaluation, CIR recommended, waiting for placement to CIR.              Anticipated d/c date is: Likely CIR in 1 to 2 days              Patient currently is medically stable to d/c.   Difficult to place patient No     Procedures:   AMPUTATION BELOW KNEE     Antimicrobials:  Anti-infectives (From admission, onward)   Start     Dose/Rate Route Frequency Ordered Stop   06/10/20 1230  ceFAZolin (ANCEF) IVPB 1  g/50 mL premix        1 g 100 mL/hr over 30 Minutes Intravenous Every 24 hours 06/09/20 1507 06/11/20 1243   06/09/20 1000  ceFAZolin (ANCEF) IVPB 1 g/50 mL premix  Status:  Discontinued        1 g 100 mL/hr over 30 Minutes Intravenous Every 12 hours 06/09/20 0723 06/09/20 1507   06/08/20 0549  ceFAZolin (ANCEF) IVPB 2g/100 mL premix        2 g 200 mL/hr over 30 Minutes Intravenous 30 min pre-op 06/08/20 0549 06/08/20 0809       Medication:  . (feeding supplement) PROSource Plus  30 mL Oral BID BM  . amLODipine  10 mg Oral QHS  . aspirin EC  81 mg Oral QHS  . atorvastatin  40 mg Oral QHS  . Chlorhexidine Gluconate Cloth  6 each Topical Q0600  . clopidogrel  75 mg Oral Daily  . heparin  5,000 Units Subcutaneous Q8H  . insulin aspart  0-15 Units Subcutaneous TID WC  . insulin glargine  20 Units Subcutaneous Daily  . metoprolol succinate  50 mg  Oral Daily  . multivitamin  1 tablet Oral QHS  . polyethylene glycol  17 g Oral Daily  . senna-docusate  1 tablet Oral BID  . sevelamer carbonate  2,400 mg Oral TID WC  . sodium chloride flush  3 mL Intravenous Q12H    acetaminophen **OR** acetaminophen, diphenhydrAMINE, oxyCODONE-acetaminophen, sevelamer carbonate   Objective:   Vitals:   06/13/20 0930 06/13/20 1000 06/13/20 1005 06/13/20 1052  BP: (!) 114/54 (!) 114/54 121/74 (!) 133/54  Pulse:    73  Resp: '12 14 16 16  '$ Temp:    98.2 F (36.8 C)  TempSrc:    Oral  SpO2:   95% 96%  Weight:   102.4 kg   Height:        Intake/Output Summary (Last 24 hours) at 06/13/2020 1142 Last data filed at 06/13/2020 1005 Gross per 24 hour  Intake --  Output 2000 ml  Net -2000 ml   Filed Weights   06/13/20 0500 06/13/20 0700 06/13/20 1005  Weight: 108.6 kg 104.4 kg 102.4 kg     Examination:   General exam: Appears calm and comfortable  Respiratory system: Clear to auscultation. Respiratory effort normal. Cardiovascular system: S1 & S2 heard, RRR. No JVD, murmurs, rubs, gallops or  clicks. No pedal edema. Gastrointestinal system: Abdomen is nondistended, soft and nontender. No organomegaly or masses felt. Normal bowel sounds heard. Central nervous system: Alert and oriented. No focal neurological deficits. Extremities: left BKA Skin: No rashes, lesions or ulcers.  Psychiatry: Judgement and insight appear poor  ------------------------------------------------------------------------------------------------------------------------------------------    LABs:  CBC Latest Ref Rng & Units 06/13/2020 06/12/2020 06/11/2020  WBC 4.0 - 10.5 K/uL 15.5(H) 16.1(H) 18.4(H)  Hemoglobin 12.0 - 15.0 g/dL 8.1(L) 8.8(L) 8.7(L)  Hematocrit 36.0 - 46.0 % 26.1(L) 27.7(L) 28.5(L)  Platelets 150 - 400 K/uL 598(H) 654(H) 619(H)   CMP Latest Ref Rng & Units 06/13/2020 06/12/2020 06/11/2020  Glucose 70 - 99 mg/dL 92 72 123(H)  BUN 8 - 23 mg/dL 70(H) 56(H) 39(H)  Creatinine 0.44 - 1.00 mg/dL 11.29(H) 9.78(H) 8.64(H)  Sodium 135 - 145 mmol/L 130(L) 130(L) 132(L)  Potassium 3.5 - 5.1 mmol/L 5.4(H) 4.8 4.2  Chloride 98 - 111 mmol/L 94(L) 91(L) 91(L)  CO2 22 - 32 mmol/L '22 25 29  '$ Calcium 8.9 - 10.3 mg/dL 7.9(L) 8.0(L) 7.9(L)  Total Protein 6.5 - 8.1 g/dL - - -  Total Bilirubin 0.3 - 1.2 mg/dL - - -  Alkaline Phos 38 - 126 U/L - - -  AST 15 - 41 U/L - - -  ALT 0 - 44 U/L - - -       Micro Results Recent Results (from the past 240 hour(s))  Culture, blood (routine x 2)     Status: None (Preliminary result)   Collection Time: 06/09/20 11:21 AM   Specimen: BLOOD LEFT HAND  Result Value Ref Range Status   Specimen Description BLOOD LEFT HAND  Final   Special Requests   Final    BOTTLES DRAWN AEROBIC ONLY Blood Culture results may not be optimal due to an inadequate volume of blood received in culture bottles   Culture   Final    NO GROWTH 3 DAYS Performed at [facility], [place] [address] [place], [place] [address]    Report Status PENDING  Incomplete  Culture, blood (routine x 2)      Status: None (Preliminary result)   Collection Time: 06/09/20 11:40 AM   Specimen: BLOOD LEFT HAND  Result Value  Ref Range Status   Specimen Description BLOOD LEFT HAND  Final   Special Requests   Final    BOTTLES DRAWN AEROBIC ONLY Blood Culture results may not be optimal due to an inadequate volume of blood received in culture bottles   Culture   Final    NO GROWTH 3 DAYS Performed at [facility], [place] [address] [place], [place] [address]    Report Status PENDING  Incomplete    Radiology Reports No results found.  SIGNED: Darliss Cheney, MD,  [facility],  Pager (please use [email or web address] to page/text) Please use Epic Secure Chat for non-urgent communication (7AM-7PM)  If 7PM-7AM, please contact night-coverage [email or web address], 06/13/2020, 11:42 AM

## 2020-06-13 NOTE — Progress Notes (Addendum)
02/15 0615 Handoff given to [place], Hemodialysis.  02/15 0649 Pt transported to hemodialysis. Pt denies pain. Pt stable.

## 2020-06-13 NOTE — Procedures (Signed)
Patient seen on Hemodialysis (rescheduled from yesterday). BP (!) 118/58   Pulse 76   Temp 98 F (36.7 C) (Oral)   Resp 16   Ht '5\' 4"'$  (1.626 m)   Wt 104.4 kg   LMP  (LMP Unknown)   SpO2 95%   BMI 39.51 kg/m   QB 400, UF goal 2L Tolerating treatment without complaints at this time.   Elmarie Shiley MD [facility]. Office # [phone] Pager # [phone] 8:44 AM

## 2020-06-13 NOTE — Progress Notes (Signed)
PT Cancellation Note  Patient Details Name: Christy Zhang MRN: [id number] DOB: November 21, 1955   Cancelled Treatment:    Reason Eval/Treat Not Completed: Patient at procedure or test/unavailable. Patient in HD this am, will re-attempt this pm if time allows.    Manley Fason 06/13/2020, 9:26 AM

## 2020-06-14 LAB — RENAL FUNCTION PANEL
Albumin: 2 g/dL — ABNORMAL LOW (ref 3.5–5.0)
Anion gap: 13 (ref 5–15)
BUN: 50 mg/dL — ABNORMAL HIGH (ref 8–23)
CO2: 23 mmol/L (ref 22–32)
Calcium: 8.1 mg/dL — ABNORMAL LOW (ref 8.9–10.3)
Chloride: 95 mmol/L — ABNORMAL LOW (ref 98–111)
Creatinine, Ser: 8.63 mg/dL — ABNORMAL HIGH (ref 0.44–1.00)
GFR, Estimated: 5 mL/min — ABNORMAL LOW (ref >60)
Glucose, Bld: 101 mg/dL — ABNORMAL HIGH (ref 70–99)
Phosphorus: 6.1 mg/dL — ABNORMAL HIGH (ref 2.5–4.6)
Potassium: 4.9 mmol/L (ref 3.5–5.1)
Sodium: 131 mmol/L — ABNORMAL LOW (ref 135–145)

## 2020-06-14 LAB — CULTURE, BLOOD (ROUTINE X 2)
Culture: NO GROWTH
Culture: NO GROWTH

## 2020-06-14 LAB — CBC
HCT: 26.9 % — ABNORMAL LOW (ref 36.0–46.0)
Hemoglobin: 8.1 g/dL — ABNORMAL LOW (ref 12.0–15.0)
MCH: 24.8 pg — ABNORMAL LOW (ref 26.0–34.0)
MCHC: 30.1 g/dL (ref 30.0–36.0)
MCV: 82.5 fL (ref 80.0–100.0)
Platelets: 563 10*3/uL — ABNORMAL HIGH (ref 150–400)
RBC: 3.26 MIL/uL — ABNORMAL LOW (ref 3.87–5.11)
RDW: 17.5 % — ABNORMAL HIGH (ref 11.5–15.5)
WBC: 15.3 10*3/uL — ABNORMAL HIGH (ref 4.0–10.5)
nRBC: 0 % (ref 0.0–0.2)

## 2020-06-14 LAB — GLUCOSE, CAPILLARY
Glucose-Capillary: 115 mg/dL — ABNORMAL HIGH (ref 70–99)
Glucose-Capillary: 189 mg/dL — ABNORMAL HIGH (ref 70–99)
Glucose-Capillary: 103 mg/dL — ABNORMAL HIGH (ref 70–99)

## 2020-06-14 NOTE — Progress Notes (Signed)
Physical Therapy Treatment Patient Details Name: Christy Zhang MRN: [id number] DOB: [birth year] Today's Date: 06/14/2020    History of Present Illness 65 yo female presents non healing TMA and underwent L BKA on 06/08/20, also being treated for leukocytosis.  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    PT Comments    Patient limited this session by fatigue from HD this AM. Patient performed repeated sit to stands from EOB with cues for hand placement. Patient unable to take steps this session, however able to pivot foot towards [facility] to reposition. Continue to recommend comprehensive inpatient rehab (CIR) for post-acute therapy needs.    Follow Up Recommendations  CIR     Equipment Recommendations  None recommended by PT    Recommendations for Other Services       Precautions / Restrictions Precautions Precautions: Fall Required Braces or Orthoses: Knee Immobilizer - Left Knee Immobilizer - Left: On at all times Restrictions Weight Bearing Restrictions: Yes LLE Weight Bearing: Non weight bearing    Mobility  Bed Mobility Overal bed mobility: Needs Assistance Bed Mobility: Supine to Sit;Sit to Supine     Supine to sit: Min guard;HOB elevated Sit to supine: Min guard   General bed mobility comments: min guard for safety with HOB elevated    Transfers Overall transfer level: Needs assistance Equipment used: Rolling Kais Monje (2 wheeled) Transfers: Sit to/from Stand Sit to Stand: Min assist         General transfer comment: cues for hand placement, sit to stand x 5. declined transfer to recliner as she felt fatigued from HD this AM  Ambulation/Gait Ambulation/Gait assistance: Min guard Gait Distance (Feet): 2 Feet Assistive device: Rolling Rayona Sardinha (2 wheeled)        General Gait Details: pivoting foot towards HOB for repositioning, unable to hop on R LE this session   Stairs             Wheelchair Mobility    Modified Rankin (Stroke Patients Only)       Balance Overall balance assessment: Needs assistance Sitting-balance support: Bilateral upper extremity supported Sitting balance-Leahy Scale: Poor Sitting balance - Comments: patient requires at least one UE support   Standing balance support: Bilateral upper extremity supported;During functional activity Standing balance-Leahy Scale: Poor Standing balance comment: reliant on UE support                            Cognition Arousal/Alertness: Awake/alert Behavior During Therapy: WFL for tasks assessed/performed Overall Cognitive Status: Within Functional Limits for tasks assessed                                        Exercises General Exercises - Lower Extremity Quad Sets: AROM;Both;10 reps;Supine Straight Leg Raises: AROM;Both;10 reps;Supine    General Comments General comments (skin integrity, edema, etc.): incision appears clean and dry, repositioned KI for patient comfort      Pertinent Vitals/Pain Pain Assessment: Faces Faces Pain Scale: No hurt    Home Living                      Prior Function  PT Goals (current goals can now be found in the care plan section) Acute Rehab PT Goals Patient Stated Goal: go home PT Goal Formulation: With patient Time For Goal Achievement: 06/23/20 Potential to Achieve Goals: Good Progress towards PT goals: Progressing toward goals    Frequency    Min 3X/week      PT Plan Current plan remains appropriate    Co-evaluation              AM-PAC PT "6 Clicks" Mobility   Outcome Measure  Help needed turning from your back to your side while in a flat bed without using bedrails?: A Little Help needed moving from lying on your back to sitting on the side of a flat bed  without using bedrails?: A Little Help needed moving to and from a bed to a chair (including a wheelchair)?: A Lot Help needed standing up from a chair using your arms (e.g., wheelchair or bedside chair)?: A Lot Help needed to walk in hospital room?: A Little Help needed climbing 3-5 steps with a railing? : A Lot 6 Click Score: 15    End of Session Equipment Utilized During Treatment: Gait belt Activity Tolerance: Patient limited by fatigue Patient left: in bed;with call bell/phone within reach;with bed alarm set Nurse Communication: Mobility status PT Visit Diagnosis: Unsteadiness on feet (R26.81);Muscle weakness (generalized) (M62.81);Other abnormalities of gait and mobility (R26.89)     Time: [id number] PT Time Calculation (min) (ACUTE ONLY): 20 min  Charges:  $Therapeutic Activity: 8-22 mins                     Veryl Winemiller A. Gilford Rile PT, DPT Acute Rehabilitation Services Pager [phone] Office [phone]    Linna Hoff 06/14/2020, 5:29 PM

## 2020-06-14 NOTE — Progress Notes (Signed)
PROGRESS NOTE    Patient: Christy Zhang                            PCP: Claretta Fraise, MD                    DOB: 01-Jan-1956            DOA: 06/08/2020 [id number]             DOS: 06/14/2020, 12:14 PM   LOS: 6 days   Date of Service: The patient was seen and examined on 06/14/2020  Subjective:   POD # 6 Left BKA Patient seen and examined.  She has no complaints.  Looking forward to getting a bed at [facility].  Brief Narrative:    Christy Zhang is a 65 y.o. female with medical history significant of ESRD on HD, IDDM, HTN, HLD, PVD, and gangrene requiring prior amputation presented for nonhealing left transmetatarsal amputation.   Patient had been hospitalized in 01/2020 after being found to have gangrene of her left toes.  She underwent angioplasty of the left anterior tibial artery on 02/23/2020, but subsequently underwent transmetatarsal amputation of the left the following day.  Patient had home health coming out to do dressing changes twice a week.  However, when the patient followed back up with vascular surgery it was noted that the wound was not healing appropriately and it was recommended that she have a below-knee amputation.  Surgery had initially been scheduled for 04/11/2020, but patient wanted to have a second opinion with the wound care doctor.  After being evaluated by the wound care specialist patient was advised that wound will likely not heal.  Surgery was postponed after patient developed Covid on 05/02/2020, and then subsequently on 05/16/2020 due to incliment weather.    On 06/08/2020 the  patient was admitted and underwent below-knee amputation of the left leg with Dr. Stanford Breed with less than 50 mL of blood loss..  Labs prior to the procedure were significant for WBC 17.1, hemoglobin 9.4, platelets 646, BUN 17, creatinine 6.21, and albumin 2.5.  Cultures remain negative.  Leukocytosis improving.  Nephrology was consulted.  She was getting hemodialysis during  hospitalization.  Seen by PT OT who recommended CIR.   Assessment & Plan:   Principal Problem:   S/P BKA (below knee amputation) unilateral (HCC) Active Problems:   ESRD (end stage renal disease) ([place])   Controlled type 2 diabetes mellitus with chronic kidney disease on chronic dialysis, with long-term current use of insulin (HCC)   Essential (primary) hypertension   Obesity, Class III, BMI 40-49.9 (morbid obesity) ([place])   Gangrene (HCC)    Assessment/Plan  Gangrene of the left forefoot  POD # 6 Left BKA -Stable no distress. -  nonhealing transmetatarsal amputation of the left foot with gangrene.   - Left below-knee amputation performed by Dr. Stanford Breed of vascular  - S/P left below-knee amputation: 06/08/2020.  Doing well.  No complaints. -Continue with pain management, oxycodone, as needed IV narcotics -Postop care per vascular -PT/OT recommends CIR, CIR consulted.  Waiting for bed availability.  Leukocytosis:  -Monitoring, improving -No signs of sepsis afebrile normotensive - WBC elevated at 17.1 >> 22.2  >>> 18.4> 16.1> 15.5> 15.3 -Following blood cultures >>> no growth to date  ESRD on HD:  -Patient normally dialyzes Monday, Wednesday, and Friday.   Dialysis per nephrology.  Appreciate help.  Essential hypertension:  Home blood pressure  medications include metoprolol succinate 100 mg daily and amlodipine 10 mg nightly.  Reportedly had some issues with the bradycardia so Toprol-XL was discontinued.  Later it was resumed back at lower dose of 50 mg p.o. daily.  She also remains on amlodipine.  Blood pressure better controlled.  Anemia of chronic disease -with chronic kidney disease  Hemoglobin is stable.  Diabetes mellitus type II,  -Seems to be well controlled -Check an A1c 5.8, last A1c 6.4 12/29/2019 -Checking CBG QA CHS, SSI coverage -  Home regimen includes glargine 100 units daily and Trulicity 1.5 mg weekly. -Continue with reduced  home Lantus 20 unit units daily  while in the hospital given more controlled diet.  PVD -Continue Plavix, aspirin and statin -Remained stable, vascular following  Hyperlipidemia -Continue home atorvastatin 40 mg nightly  Hypoalbuminemia: Acute.   Albumin noted 2.5, 2.2  -Monitoring replete as needed  Morbid obesity BMI 40.34 kg/m -Patient has been counseled regarding aggressive weight loss -Dietary education  ------------------------------------------------------------------------------------------------------------------------------------ Nutritional status:  The patient's BMI is: Body mass index is 38.18 kg/m. I agree with the assessment and plan as outlined below:  Skin Assessment: I have examined the patient's skin and I agree with the wound assessment as performed by wound care team As outlined ....      ------------------------------------------------------------------------------------------------------------------------------------ Cultures; Blood Cultures x 2 >> NGTD  Antimicrobials: 06/08/2020 Ancef >>> 06/10/2020  Consultants: Vascular surgery and nephrology  ------------------------------------------------------------------------------------------------------------------------------------  DVT prophylaxis:  Heparin SQ Code Status:   Code Status: Full Code  Family Communication: Discussed with patient in detail no family member present at bedside-  -Advance care planning has been discussed.   Admission status:   Status is: Inpatient Level of care: Telemetry Medical  Remains inpatient appropriate because:Inpatient level of care appropriate due to severity of illness   Dispo: The patient is from: Home              Anticipated d/c is to: Status post PT OT evaluation, CIR recommended, waiting for placement to CIR.              Anticipated d/c date is: Likely CIR in 1 to 2 days              Patient currently is medically stable to d/c.   Difficult to place patient  No     Procedures:   AMPUTATION BELOW KNEE     Antimicrobials:  Anti-infectives (From admission, onward)   Start     Dose/Rate Route Frequency Ordered Stop   06/10/20 1230  ceFAZolin (ANCEF) IVPB 1 g/50 mL premix        1 g 100 mL/hr over 30 Minutes Intravenous Every 24 hours 06/09/20 1507 06/11/20 1243   06/09/20 1000  ceFAZolin (ANCEF) IVPB 1 g/50 mL premix  Status:  Discontinued        1 g 100 mL/hr over 30 Minutes Intravenous Every 12 hours 06/09/20 0723 06/09/20 1507   06/08/20 0549  ceFAZolin (ANCEF) IVPB 2g/100 mL premix        2 g 200 mL/hr over 30 Minutes Intravenous 30 min pre-op 06/08/20 0549 06/08/20 0809       Medication:  . (feeding supplement) PROSource Plus  30 mL Oral BID BM  . amLODipine  10 mg Oral QHS  . aspirin EC  81 mg Oral QHS  . atorvastatin  40 mg Oral QHS  . Chlorhexidine Gluconate Cloth  6 each Topical Q0600  . clopidogrel  75 mg Oral Daily  . heparin  5,000 Units Subcutaneous Q8H  . insulin aspart  0-15 Units Subcutaneous TID WC  . insulin glargine  20 Units Subcutaneous Daily  . metoprolol succinate  50 mg Oral Daily  . multivitamin  1 tablet Oral QHS  . polyethylene glycol  17 g Oral Daily  . senna-docusate  1 tablet Oral BID  . sevelamer carbonate  2,400 mg Oral TID WC  . sodium chloride flush  3 mL Intravenous Q12H    acetaminophen **OR** acetaminophen, diphenhydrAMINE, oxyCODONE-acetaminophen, sevelamer carbonate   Objective:   Vitals:   06/14/20 0900 06/14/20 0930 06/14/20 0954 06/14/20 1021  BP: 124/63 128/60 129/61 135/70  Pulse: 63 65 65 62  Resp:   18 18  Temp:   98.1 F (36.7 C) 97.7 F (36.5 C)  TempSrc:   Oral Oral  SpO2:   95% 100%  Weight:   100.9 kg   Height:        Intake/Output Summary (Last 24 hours) at 06/14/2020 1214 Last data filed at 06/14/2020 1031 Gross per 24 hour  Intake 3 ml  Output 2002 ml  Net -1999 ml   Filed Weights   06/14/20 0500 06/14/20 0704 06/14/20 0954  Weight: 103.5 kg 102.9 kg  100.9 kg     Examination:   General exam: Appears calm and comfortable  Respiratory system: Clear to auscultation. Respiratory effort normal. Cardiovascular system: S1 & S2 heard, RRR. No JVD, murmurs, rubs, gallops or clicks. No pedal edema. Gastrointestinal system: Abdomen is nondistended, soft and nontender. No organomegaly or masses felt. Normal bowel sounds heard. Central nervous system: Alert and oriented. No focal neurological deficits. Extremities: Left BKA Skin: No rashes, lesions or ulcers.  Psychiatry: Judgement and insight appear poor  ------------------------------------------------------------------------------------------------------------------------------------------    LABs:  CBC Latest Ref Rng & Units 06/14/2020 06/13/2020 06/12/2020  WBC 4.0 - 10.5 K/uL 15.3(H) 15.5(H) 16.1(H)  Hemoglobin 12.0 - 15.0 g/dL 8.1(L) 8.1(L) 8.8(L)  Hematocrit 36.0 - 46.0 % 26.9(L) 26.1(L) 27.7(L)  Platelets 150 - 400 K/uL 563(H) 598(H) 654(H)   CMP Latest Ref Rng & Units 06/14/2020 06/13/2020 06/12/2020  Glucose 70 - 99 mg/dL 101(H) 92 72  BUN 8 - 23 mg/dL 50(H) 70(H) 56(H)  Creatinine 0.44 - 1.00 mg/dL 8.63(H) 11.29(H) 9.78(H)  Sodium 135 - 145 mmol/L 131(L) 130(L) 130(L)  Potassium 3.5 - 5.1 mmol/L 4.9 5.4(H) 4.8  Chloride 98 - 111 mmol/L 95(L) 94(L) 91(L)  CO2 22 - 32 mmol/L '23 22 25  '$ Calcium 8.9 - 10.3 mg/dL 8.1(L) 7.9(L) 8.0(L)  Total Protein 6.5 - 8.1 g/dL - - -  Total Bilirubin 0.3 - 1.2 mg/dL - - -  Alkaline Phos 38 - 126 U/L - - -  AST 15 - 41 U/L - - -  ALT 0 - 44 U/L - - -       Micro Results Recent Results (from the past 240 hour(s))  Culture, blood (routine x 2)     Status: None   Collection Time: 06/09/20 11:21 AM   Specimen: BLOOD LEFT HAND  Result Value Ref Range Status   Specimen Description BLOOD LEFT HAND  Final   Special Requests   Final    BOTTLES DRAWN AEROBIC ONLY Blood Culture results may not be optimal due to an inadequate volume of blood received  in culture bottles   Culture   Final    NO GROWTH 5 DAYS Performed at [facility], [place] [address], [place], [place] [address]    Report Status 06/14/2020 FINAL  Final  Culture, blood (routine x 2)     Status: None   Collection Time: 06/09/20 11:40 AM   Specimen: BLOOD LEFT HAND  Result Value Ref Range Status   Specimen Description BLOOD LEFT HAND  Final   Special Requests   Final    BOTTLES DRAWN AEROBIC ONLY Blood Culture results may not be optimal due to an inadequate volume of blood received in culture bottles   Culture   Final    NO GROWTH 5 DAYS Performed at [facility], [place] [address] [place], [place] [address]    Report Status 06/14/2020 FINAL  Final    Radiology Reports No results found.  SIGNED: Darliss Cheney, MD,  [facility],  Pager (please use [email or web address] to page/text) Please use Epic Secure Chat for non-urgent communication (7AM-7PM)  If 7PM-7AM, please contact night-coverage [email or web address], 06/14/2020, 12:14 PM

## 2020-06-14 NOTE — Plan of Care (Signed)
  Problem: Education: Goal: Knowledge of General Education information will improve Description: Including pain rating scale, medication(s)/side effects and non-pharmacologic comfort measures Outcome: Progressing   Problem: Clinical Measurements: Goal: Respiratory complications will improve Outcome: Progressing   Problem: Activity: Goal: Risk for activity intolerance will decrease Outcome: Progressing   Problem: Nutrition: Goal: Adequate nutrition will be maintained Outcome: Progressing   Problem: Coping: Goal: Level of anxiety will decrease Outcome: Progressing   Problem: Elimination: Goal: Will not experience complications related to bowel motility Outcome: Progressing Goal: Will not experience complications related to urinary retention Outcome: Progressing   Problem: Pain Managment: Goal: General experience of comfort will improve Outcome: Progressing   Problem: Safety: Goal: Ability to remain free from injury will improve Outcome: Progressing

## 2020-06-14 NOTE — Care Management Important Message (Signed)
Important Message  Patient Details  Name: Christy Zhang MRN: [id number] Date of Birth: [birth year]   Medicare Important Message Given:  Yes - Important Message mailed due to current National Emergency  Verbal consent obtained due to current National Emergency  Relationship to patient: Self Contact Name: Maiya Call Date: 06/14/20  Time: 27 Phone: [phone] Outcome: Spoke with contact Important Message mailed to: Patient address on file    Delorse Lek 06/14/2020, 3:11 PM

## 2020-06-14 NOTE — Progress Notes (Signed)
02/16 0631 Hand off given to Merrill.  02/16 0700 Pt transported to dialysis. Pt denies pain. Pt stable.

## 2020-06-14 NOTE — Progress Notes (Signed)
Occupational Therapy Treatment Patient Details Name: Christy Zhang MRN: [id number] DOB: Mar 11, 1956 Today's Date: 06/14/2020    History of present illness 65 yo female presents non healing TMA and underwent L BKA on 06/08/20, also being treated for leukocytosis.  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   OT comments  Pt making steady progress towards OT goals this session. Pt continues to present with decreased activity tolerance, impaired balance and generlized weakness impacting pts ability to complete BADLs independently. Pt just finishing up lunch upon arrival however pt agreeable to OT intervention. Pt completed x2 sit<>stands from EOB with RW but unable to pivot to recliner this session x2 near LOB needing to sit back down on bed. Pt reports feeling "too full from lunch" to pivot over to recliner and request return to bed. Will continue efforts at progressing functional mobility. DC plan remains appropriate, will continue to follow acutely per POC.    Follow Up Recommendations  CIR    Equipment Recommendations  3 in 1 bedside commode;Other (comment) (TBD at [facility])    Recommendations for Other Services      Precautions / Restrictions Precautions Precautions: Fall Required Braces or Orthoses: Knee Immobilizer - Left Knee Immobilizer - Left: On at all times Restrictions Weight Bearing Restrictions: Yes LLE Weight Bearing: Non weight bearing       Mobility Bed Mobility   Bed Mobility: Supine to Sit     Supine to sit: Min guard;HOB elevated     General bed mobility comments: minguard for safety with HOB slightly elevated, cues for reaching to hand rail  Transfers Overall transfer level: Needs assistance Equipment used: Rolling walker (2 wheeled) Transfers: Sit to/from Stand Sit  to Stand: Mod assist         General transfer comment: cues for hand placement and MOD A x2 to power up from EOB and for steadying assist, unable to pivot this session despite x2 attempts. pt attempted heel<>toe pivot and hop/to pattern with near LOB needing to sit back down on bed    Balance Overall balance assessment: Needs assistance Sitting-balance support: Bilateral upper extremity supported Sitting balance-Leahy Scale: Poor Sitting balance - Comments: at least one UE supported during static sitting tasks   Standing balance support: Bilateral upper extremity supported;During functional activity Standing balance-Leahy Scale: Poor Standing balance comment: reliant on UE support                           ADL either performed or assessed with clinical judgement   ADL Overall ADL's : Needs assistance/impaired             Lower Body Bathing: Sitting/lateral leans;Maximal assistance Lower Body Bathing Details (indicate cue type and reason): simulated via LB dressing from EOB     Lower Body Dressing: Maximal assistance;Sitting/lateral leans Lower Body Dressing Details (indicate cue type and reason): to reposition KI from EOB Toilet Transfer: Moderate assistance;RW Toilet Transfer Details (indicate cue type and reason): unable to pivot but MOD A to rise into standing         Functional mobility during ADLs: Moderate assistance;Rolling walker (sit<>stand only) General ADL Comments: pt limited by decreased activity tolerance, impaired balance and generlized weakness     Vision       Perception  Praxis      Cognition Arousal/Alertness: Awake/alert Behavior During Therapy: WFL for tasks assessed/performed Overall Cognitive Status: Within Functional Limits for tasks assessed                                          Exercises     Shoulder Instructions       General Comments incision appears clean and dry    Pertinent Vitals/  Pain       Pain Assessment: No/denies pain  Home Living                                          Prior Functioning/Environment              Frequency  Min 2X/week        Progress Toward Goals  OT Goals(current goals can now be found in the care plan section)  Progress towards OT goals: Progressing toward goals  Acute Rehab OT Goals Patient Stated Goal: go home OT Goal Formulation: With patient Time For Goal Achievement: 06/23/20 Potential to Achieve Goals: Good  Plan Discharge plan remains appropriate;Frequency remains appropriate    Co-evaluation                 AM-PAC OT "6 Clicks" Daily Activity     Outcome Measure   Help from another person eating meals?: None Help from another person taking care of personal grooming?: A Little Help from another person toileting, which includes using toliet, bedpan, or urinal?: A Lot Help from another person bathing (including washing, rinsing, drying)?: A Lot Help from another person to put on and taking off regular upper body clothing?: A Little Help from another person to put on and taking off regular lower body clothing?: A Lot 6 Click Score: 16    End of Session Equipment Utilized During Treatment: Gait belt;Rolling walker  OT Visit Diagnosis: Unsteadiness on feet (R26.81);Other abnormalities of gait and mobility (R26.89);Pain Pain - Right/Left: Left Pain - part of body: Leg   Activity Tolerance Patient tolerated treatment well   Patient Left in bed;with call bell/phone within reach;with bed alarm set   Nurse Communication Mobility status        Time: [id number] OT Time Calculation (min): 14 min  Charges: OT General Charges $OT Visit: 1 Visit OT Treatments $Self Care/Home Management : 8-22 mins  Harley Alto., COTA/L Acute Rehabilitation Services [phone]    Precious Haws 06/14/2020, 3:47 PM

## 2020-06-14 NOTE — Procedures (Signed)
Patient seen on Hemodialysis. BP (!) 114/97   Pulse 63   Temp 98 F (36.7 C) (Oral)   Resp 18   Ht '5\' 4"'$  (1.626 m)   Wt 102.9 kg   LMP  (LMP Unknown)   SpO2 100%   BMI 38.94 kg/m   QB 400, UF goal 2L Tolerating treatment without complaints at this time.   Elmarie Shiley MD [facility]. Office # [phone] Pager # [phone] 8:56 AM

## 2020-06-15 LAB — GLUCOSE, CAPILLARY
Glucose-Capillary: 112 mg/dL — ABNORMAL HIGH (ref 70–99)
Glucose-Capillary: 121 mg/dL — ABNORMAL HIGH (ref 70–99)
Glucose-Capillary: 189 mg/dL — ABNORMAL HIGH (ref 70–99)
Glucose-Capillary: 137 mg/dL — ABNORMAL HIGH (ref 70–99)

## 2020-06-15 LAB — RENAL FUNCTION PANEL
Albumin: 2.1 g/dL — ABNORMAL LOW (ref 3.5–5.0)
Anion gap: 12 (ref 5–15)
BUN: 42 mg/dL — ABNORMAL HIGH (ref 8–23)
CO2: 26 mmol/L (ref 22–32)
Calcium: 8.3 mg/dL — ABNORMAL LOW (ref 8.9–10.3)
Chloride: 95 mmol/L — ABNORMAL LOW (ref 98–111)
Creatinine, Ser: 7.46 mg/dL — ABNORMAL HIGH (ref 0.44–1.00)
GFR, Estimated: 6 mL/min — ABNORMAL LOW (ref >60)
Glucose, Bld: 123 mg/dL — ABNORMAL HIGH (ref 70–99)
Phosphorus: 5.5 mg/dL — ABNORMAL HIGH (ref 2.5–4.6)
Potassium: 4.7 mmol/L (ref 3.5–5.1)
Sodium: 133 mmol/L — ABNORMAL LOW (ref 135–145)

## 2020-06-15 LAB — CBC
HCT: 26.9 % — ABNORMAL LOW (ref 36.0–46.0)
Hemoglobin: 8.3 g/dL — ABNORMAL LOW (ref 12.0–15.0)
MCH: 25.5 pg — ABNORMAL LOW (ref 26.0–34.0)
MCHC: 30.9 g/dL (ref 30.0–36.0)
MCV: 82.5 fL (ref 80.0–100.0)
Platelets: 600 10*3/uL — ABNORMAL HIGH (ref 150–400)
RBC: 3.26 MIL/uL — ABNORMAL LOW (ref 3.87–5.11)
RDW: 17.3 % — ABNORMAL HIGH (ref 11.5–15.5)
WBC: 14.7 10*3/uL — ABNORMAL HIGH (ref 4.0–10.5)
nRBC: 0 % (ref 0.0–0.2)

## 2020-06-15 MED ORDER — CHLORHEXIDINE GLUCONATE CLOTH 2 % EX PADS
6.0000 | MEDICATED_PAD | Freq: Every day | CUTANEOUS | Status: DC
  Administered 2020-06-15 – 2020-06-16 (×2): 6 via TOPICAL

## 2020-06-15 MED ORDER — DARBEPOETIN ALFA 60 MCG/0.3ML IJ SOSY
60.0000 ug | PREFILLED_SYRINGE | INTRAMUSCULAR | Status: DC
  Filled 2020-06-15: qty 0.3

## 2020-06-15 MED ORDER — DOXERCALCIFEROL 4 MCG/2ML IV SOLN
1.5000 ug | INTRAVENOUS | Status: DC
  Filled 2020-06-15: qty 2

## 2020-06-15 NOTE — H&P (Incomplete)
Physical Medicine and Rehabilitation Admission H&P     HPI: Christy Zhang is a 65 year old right-handed female with history of diastolic congestive heart failure, asthma, end-stage renal disease with hemodialysis, diabetes mellitus, hypertension, hyperlipidemia, remote tobacco use, peripheral vascular disease maintained on aspirin and Plavix hospitalized 01/2020 after being found to have gangrene of her left toes underwent angioplasty of the left anterior tibial artery on 02/23/2020 but subsequently underwent transtibial amputation of the left the following day.  Patient had been receiving home health therapies for dressing changes..  She continued to be followed by vascular surgery noted wound with poor healing recommendations of BKA.  Surgery had initially been scheduled for 04/11/2020 but patient wanted to have second opinion.  After being evaluated by the wound care specialist was advised need for surgery.  Surgery had been postponed after she developed Covid on 05/02/2020 and then subsequently on 05/16/2020 due to inclement weather and had to be rescheduled.  Per chart review patient lives with her daughter and 2 grandsons.  Two-level home bed and bath main level with ramped entrance.  Primarily used a wheelchair.  She was admitted 06/08/2020 with admission chemistries creatinine blood cultures no growth to date, 6.21, hemoglobin A1c 5.8, calcium 8.3, hemoglobin 9.4, WBC 17,100 and ultimately underwent left BKA per Dr. Jamelle Haring.  Patient continued on hemodialysis as directed.  Subcutaneous heparin for DVT prophylaxis.  Acute on chronic anemia 8.3 and monitored as well as WBC 14,700.  Therapy evaluations completed due to patient's decrease in functional mobility was admitted for a comprehensive rehab program.  Review of Systems  Constitutional: Positive for fever. Negative for chills.  HENT: Negative for hearing loss.   Eyes: Negative for blurred vision and double vision.  Respiratory:  Negative for cough.        Shortness of breath with exertion  Cardiovascular: Positive for leg swelling. Negative for chest pain and palpitations.  Gastrointestinal: Positive for constipation. Negative for heartburn, nausea and vomiting.  Genitourinary: Negative for dysuria, flank pain and hematuria.  Musculoskeletal: Positive for joint pain and myalgias.  Skin: Negative for rash.  All other systems reviewed and are negative.  Past Medical History:  Diagnosis Date  . Anemia   . Arthritis   . Asthma    in the past  . CHF (congestive heart failure) ([place])   . Chronic kidney disease    Renal failure, dialysis on M/W/F  . 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 End stage renal disease ([place])   . End stage renal disease on dialysis ([place])   . Hemodialysis access, arteriovenous graft ([place])   . Hyperlipidemia   . Hypertension   . Neuromuscular disorder [facility])    Past Surgical History:  Procedure Laterality Date  .  LEFT BELOW KNEE AMPUTATION (Left Knee)  06/08/2020  . A/V FISTULAGRAM Right 02/04/2017   Procedure: A/V Fistulagram;  Surgeon: Waynetta Sandy, MD;  Location: [place] [facility];  Service: Cardiovascular;  Laterality: Right;  . ABDOMINAL AORTOGRAM W/LOWER EXTREMITY N/A 11/18/2019   Procedure: ABDOMINAL AORTOGRAM W/LOWER EXTREMITY;  Surgeon: [name], MD;  Location: [place] [facility];  Service: Cardiovascular;  Laterality: N/A;  . ABDOMINAL AORTOGRAM W/LOWER EXTREMITY N/A 02/23/2020   Procedure: ABDOMINAL AORTOGRAM W/LOWER EXTREMITY;  Surgeon: [name], MD;  Location: [place] [facility];  Service: Cardiovascular;  Laterality: N/A;  Bilateral  . AMPUTATION Left 06/08/2020   Procedure: LEFT BELOW KNEE AMPUTATION;  Surgeon: Cherre Robins,  MD;  Location: [place];  Service: Vascular;  Laterality: Left;  . AV FISTULA PLACEMENT Right 08/25/2014   Procedure: RIGHT BRACHIOCEPHALIC ARTERIOVENOUS (AV) FISTULA CREATION;  Surgeon: Mal Misty, MD;  Location: [facility];  Service: Vascular;  Laterality: Right;  . AV FISTULA PLACEMENT Right 03/18/2019   Procedure: INSERTION OF ARTERIOVENOUS (AV) GORE-TEX GRAFT RIGHT ARM;  Surgeon: Serafina Mitchell, MD;  Location: [place];  Service: Vascular;  Laterality: Right;  . BASCILIC VEIN TRANSPOSITION Right 12/24/2018   Procedure: FIRST STAGE BASILIC VEIN TRANSPOSITION RIGHT UPPER ARM;  Surgeon: Serafina Mitchell, MD;  Location: [place];  Service: Vascular;  Laterality: Right;  . FISTULA SUPERFICIALIZATION Right 11/03/2014   Procedure: RIGHT UPPER ARM FISTULA SUPERFICIALIZATION;  Surgeon: Mal Misty, MD;  Location: [place];  Service: Vascular;  Laterality: Right;  . FISTULOGRAM Right 01/16/2016   Procedure: RIGHT UPPER EXTREMITY ARTERIOVENOUS FISTULOGRAM WITH BALLOON ANGIOPLASTY;  Surgeon: Vickie Epley, MD;  Location: [facility];  Service: Vascular;  Laterality: Right;  . INSERTION OF DIALYSIS CATHETER     X4  . IR FLUORO GUIDE CV LINE RIGHT  12/03/2018  . IR REMOVAL TUN CV CATH W/O FL  07/06/2019  . IR THROMBECTOMY AV FISTULA W/THROMBOLYSIS/PTA INC/SHUNT/IMG RIGHT Right 12/03/2018  . IR THROMBECTOMY AV FISTULA W/THROMBOLYSIS/PTA/STENT INC/SHUNT/IMG RT Right 10/11/2019  . IR US GUIDE VASC ACCESS RIGHT  12/03/2018  . IR US GUIDE VASC ACCESS RIGHT  12/03/2018  . IR US GUIDE VASC ACCESS RIGHT  10/14/2019  . LIGATION OF COMPETING BRANCHES OF ARTERIOVENOUS FISTULA Right 11/03/2014   Procedure: LIGATION OF COMPETING BRANCHE x1 OF RIGHT UPPER ARM ARTERIOVENOUS FISTULA;  Surgeon: Mal Misty, MD;  Location: [place];  Service: Vascular;  Laterality: Right;  . PERIPHERAL VASCULAR BALLOON ANGIOPLASTY Right 02/04/2017   Procedure: PERIPHERAL VASCULAR BALLOON ANGIOPLASTY;  Surgeon: Waynetta Sandy, MD;  Location: [place] [facility];  Service: Cardiovascular;  Laterality: Right;  . PERIPHERAL VASCULAR BALLOON ANGIOPLASTY Right 11/18/2019   Procedure: PERIPHERAL VASCULAR BALLOON ANGIOPLASTY;  Surgeon: [name], MD;  Location: [place] [facility];  Service: Cardiovascular;  Laterality: Right;  anterior tibial  . PERIPHERAL VASCULAR BALLOON ANGIOPLASTY  02/23/2020   Procedure: PERIPHERAL VASCULAR BALLOON ANGIOPLASTY;  Surgeon: [name], MD;  Location: [place] [facility];  Service: Cardiovascular;;  Lt  AT  . TRANSMETATARSAL AMPUTATION Left 02/24/2020   Procedure: TRANSMETATARSAL AMPUTATION LEFT;  Surgeon: Cherre Robins, MD;  Location: [place];  Service: Vascular;  Laterality: Left;  . TUBAL LIGATION     Family History  Problem Relation Age of Onset  . Diabetes Mother   . Hypertension Mother   . Cancer Father   . Diabetes Father   . Hypertension Sister   . Diabetes Daughter   . Heart disease Daughter   . Hypertension Daughter   . Diabetes Son   . Heart disease Son   . Hypertension Son   . Peripheral vascular disease Son        amputation   Social History:  reports that she quit smoking about 35 years ago. Her smoking use included cigarettes. She has never used smokeless tobacco. She reports that she does not drink alcohol and does not use drugs. Allergies:  Allergies  Allergen Reactions  . Sulfa Antibiotics Swelling   Facility-Administered Medications Prior to Admission  Medication Dose Route Frequency Provider Last Rate Last Admin  . sodium chloride flush (NS) 0.9 % injection 3 mL  3 mL Intravenous Q12H  [name], MD      . sodium chloride flush (NS) 0.9 % injection 3 mL  3 mL Intravenous PRN [name], MD       Medications Prior to Admission  Medication Sig Dispense Refill  . acetaminophen (TYLENOL) 325 MG tablet Take 2 tablets (650 mg total) by mouth every 6 (six) hours as needed for mild pain (or Fever >/= 101). 12 tablet 0  . amLODipine (NORVASC) 10 MG tablet Take 1 tablet (10 mg total) by mouth at bedtime. 30 tablet 0  . aspirin EC 81 MG tablet Take 81 mg by mouth at bedtime. Swallow whole.    Marland Kitchen atorvastatin (LIPITOR) 40 MG tablet  Take 1 tablet (40 mg total) by mouth daily. (Patient taking differently: Take 40 mg by mouth at bedtime.) 90 tablet 3  . clopidogrel (PLAVIX) 75 MG tablet Take 1 tablet (75 mg total) by mouth daily. 30 tablet 11  . diphenhydrAMINE (BENADRYL) 25 MG tablet Take 25 mg by mouth every 6 (six) hours as needed for itching.    . Dulaglutide (TRULICITY) 1.5 0000000 SOPN Inject 1.5 mg into the skin once a week. Saturday    . Insulin Glargine (TOUJEO MAX SOLOSTAR [place]) Inject 100 Units into the skin daily.    . metoprolol succinate (TOPROL-XL) 100 MG 24 hr tablet Take 1 tablet (100 mg total) by mouth daily. TAKE ONE TABLET BY MOUTH DAILY. TAKE WITH OR IMMEDIATELY FOLLOWING A MEAL. (Patient taking differently: Take 100 mg by mouth daily with supper. TAKE WITH OR IMMEDIATELY FOLLOWING A MEAL.) 90 tablet 1  . oxyCODONE-acetaminophen (PERCOCET) 5-325 MG tablet Take 1 tablet by mouth every 8 (eight) hours as needed for severe pain. 20 tablet 0  . sevelamer carbonate (RENVELA) 800 MG tablet Take 1,600-2,400 mg by mouth See admin instructions. Take 2400 mg with each meal and 1600 mg with each snack    . Insulin Degludec (TRESIBA) 100 UNIT/ML SOLN Inject 100 Units into the skin daily. (Patient not taking: No sig reported) 30 mL 5  . multivitamin (RENA-VIT) TABS tablet Take 1 tablet by mouth at bedtime.     Marland Kitchen oxyCODONE-acetaminophen (PERCOCET) 5-325 MG tablet Take 1 tablet by mouth every 8 (eight) hours as needed for up to 10 doses for severe pain. 10 tablet 0    Drug Regimen Review Drug regimen was reviewed and remains appropriate with no significant issues identified  Home: Home Living Family/patient expects to be discharged to:: Private residence Living Arrangements: Children,Other relatives (2 grandsons) Available Help at Discharge: Family Type of Home: House Home Access: Ramped entrance Home Layout: Two level,Able to live on main level with bedroom/bathroom Bathroom Shower/Tub: Tub/shower unit (pt takes  sponge baths) Bathroom Toilet: Standard (pt uses bedside commode) Bathroom Accessibility: Yes Home Equipment: Walker - 2 wheels,Tub bench,Hand held shower head,Wheelchair - manual  Lives With: Daughter   Functional History: Prior Function Level of Independence: Needs assistance Gait / Transfers Assistance Needed: uses w/c for primary mobility ADL's / Homemaking Assistance Needed: Pt needs help with bra and socks.  Sponge bathes upper body on her own but daughter does the lower body.  Pt does light ADLs only, pt does some cooking but daughter does most, Can manage meds. Does not go inot bathroom Comments: Pt wants grab bars in shower  Functional Status:  Mobility: Bed Mobility Overal bed mobility: Needs Assistance Bed Mobility: Supine to Sit,Sit to Supine Supine to sit: Min guard,HOB elevated Sit to supine: Min guard General bed mobility comments:  min guard for safety with HOB elevated Transfers Overall transfer level: Needs assistance Equipment used: Rolling walker (2 wheeled) Transfers: Sit to/from Stand Sit to Stand: Min assist Stand pivot transfers: Min assist General transfer comment: cues for hand placement, sit to stand x 5. declined transfer to recliner as she felt fatigued from HD this AM Ambulation/Gait Ambulation/Gait assistance: Min guard Gait Distance (Feet): 2 Feet Assistive device: Rolling walker (2 wheeled) Gait Pattern/deviations: Step-to pattern (hop to pattern.) General Gait Details: pivoting foot towards HOB for repositioning, unable to hop on R LE this session    ADL: ADL Overall ADL's : Needs assistance/impaired Eating/Feeding: Set up,Independent,Sitting Grooming: Wash/dry hands,Wash/dry face,Sitting,Supervision/safety,Set up Upper Body Bathing: Supervision/ safety,Set up,Sitting Upper Body Bathing Details (indicate cue type and reason): simulated seated in recliner Lower Body Bathing: Sitting/lateral leans,Maximal assistance Lower Body Bathing Details  (indicate cue type and reason): simulated via LB dressing from EOB Upper Body Dressing : Supervision/safety,Set up,Sitting Upper Body Dressing Details (indicate cue type and reason): donned clean gown Lower Body Dressing: Maximal assistance,Sitting/lateral leans Lower Body Dressing Details (indicate cue type and reason): to reposition KI from EOB Toilet Transfer: Moderate assistance,RW Toilet Transfer Details (indicate cue type and reason): unable to pivot but MOD A to rise into standing Toileting- Clothing Manipulation and Hygiene: Minimal assistance,Sitting/lateral lean Functional mobility during ADLs: Moderate assistance,Rolling walker (sit<>stand only) General ADL Comments: pt limited by decreased activity tolerance, impaired balance and generlized weakness  Cognition: Cognition Overall Cognitive Status: Within Functional Limits for tasks assessed Orientation Level: Oriented X4 Cognition Arousal/Alertness: Awake/alert Behavior During Therapy: WFL for tasks assessed/performed Overall Cognitive Status: Within Functional Limits for tasks assessed  Physical Exam: Blood pressure (!) 142/47, pulse 80, temperature 97.7 F (36.5 C), temperature source Oral, resp. rate 16, height '5\' 4"'$  (1.626 m), weight 102.4 kg, SpO2 96 %. Physical Exam Skin:    Comments: Left BKA site is dressed.  Neurological:     Comments: Patient is alert in no acute distress.  Oriented x3 and follows commands.     Results for orders placed or performed during the hospital encounter of 06/08/20 (from the past 48 hour(s))  Glucose, capillary     Status: Abnormal   Collection Time: 06/13/20 11:52 AM  Result Value Ref Range   Glucose-Capillary 121 (H) 70 - 99 mg/dL    Comment: Glucose reference range applies only to samples taken after fasting for at least 8 hours.  Glucose, capillary     Status: None   Collection Time: 06/13/20  4:36 PM  Result Value Ref Range   Glucose-Capillary 89 70 - 99 mg/dL    Comment:  Glucose reference range applies only to samples taken after fasting for at least 8 hours.  Glucose, capillary     Status: Abnormal   Collection Time: 06/13/20  7:51 PM  Result Value Ref Range   Glucose-Capillary 107 (H) 70 - 99 mg/dL    Comment: Glucose reference range applies only to samples taken after fasting for at least 8 hours.  Renal function panel     Status: Abnormal   Collection Time: 06/14/20  4:51 AM  Result Value Ref Range   Sodium 131 (L) 135 - 145 mmol/L   Potassium 4.9 3.5 - 5.1 mmol/L   Chloride 95 (L) 98 - 111 mmol/L   CO2 23 22 - 32 mmol/L   Glucose, Bld 101 (H) 70 - 99 mg/dL    Comment: Glucose reference range applies only to samples taken after fasting for at least 8  hours.   BUN 50 (H) 8 - 23 mg/dL   Creatinine, Ser 8.63 (H) 0.44 - 1.00 mg/dL   Calcium 8.1 (L) 8.9 - 10.3 mg/dL   Phosphorus 6.1 (H) 2.5 - 4.6 mg/dL   Albumin 2.0 (L) 3.5 - 5.0 g/dL   GFR, Estimated 5 (L) >60 mL/min    Comment: (NOTE) Calculated using the CKD-EPI Creatinine Equation (2021)    Anion gap 13 5 - 15    Comment: Performed at [facility] [address] [place], [place] [address]  CBC     Status: Abnormal   Collection Time: 06/14/20  4:51 AM  Result Value Ref Range   WBC 15.3 (H) 4.0 - 10.5 K/uL   RBC 3.26 (L) 3.87 - 5.11 MIL/uL   Hemoglobin 8.1 (L) 12.0 - 15.0 g/dL   HCT 26.9 (L) 36.0 - 46.0 %   MCV 82.5 80.0 - 100.0 fL   MCH 24.8 (L) 26.0 - 34.0 pg   MCHC 30.1 30.0 - 36.0 g/dL   RDW 17.5 (H) 11.5 - 15.5 %   Platelets 563 (H) 150 - 400 K/uL   nRBC 0.0 0.0 - 0.2 %    Comment: Performed at [facility] [address] [place], [place] [address]  Glucose, capillary     Status: Abnormal   Collection Time: 06/14/20 11:57 AM  Result Value Ref Range   Glucose-Capillary 115 (H) 70 - 99 mg/dL    Comment: Glucose reference range applies only to samples taken after fasting for at least 8 hours.  Glucose, capillary     Status: Abnormal   Collection Time: 06/14/20  4:33 PM   Result Value Ref Range   Glucose-Capillary 189 (H) 70 - 99 mg/dL    Comment: Glucose reference range applies only to samples taken after fasting for at least 8 hours.  Glucose, capillary     Status: Abnormal   Collection Time: 06/14/20  8:05 PM  Result Value Ref Range   Glucose-Capillary 103 (H) 70 - 99 mg/dL    Comment: Glucose reference range applies only to samples taken after fasting for at least 8 hours.  Renal function panel     Status: Abnormal   Collection Time: 06/15/20  2:01 AM  Result Value Ref Range   Sodium 133 (L) 135 - 145 mmol/L   Potassium 4.7 3.5 - 5.1 mmol/L   Chloride 95 (L) 98 - 111 mmol/L   CO2 26 22 - 32 mmol/L   Glucose, Bld 123 (H) 70 - 99 mg/dL    Comment: Glucose reference range applies only to samples taken after fasting for at least 8 hours.   BUN 42 (H) 8 - 23 mg/dL   Creatinine, Ser 7.46 (H) 0.44 - 1.00 mg/dL   Calcium 8.3 (L) 8.9 - 10.3 mg/dL   Phosphorus 5.5 (H) 2.5 - 4.6 mg/dL   Albumin 2.1 (L) 3.5 - 5.0 g/dL   GFR, Estimated 6 (L) >60 mL/min    Comment: (NOTE) Calculated using the CKD-EPI Creatinine Equation (2021)    Anion gap 12 5 - 15    Comment: Performed at [facility] [address] [place] [address]  CBC     Status: Abnormal   Collection Time: 06/15/20  2:01 AM  Result Value Ref Range   WBC 14.7 (H) 4.0 - 10.5 K/uL   RBC 3.26 (L) 3.87 - 5.11 MIL/uL   Hemoglobin 8.3 (L) 12.0 - 15.0 g/dL   HCT 26.9 (L) 36.0 -  46.0 %   MCV 82.5 80.0 - 100.0 fL   MCH 25.5 (L) 26.0 - 34.0 pg   MCHC 30.9 30.0 - 36.0 g/dL   RDW 17.3 (H) 11.5 - 15.5 %   Platelets 600 (H) 150 - 400 K/uL   nRBC 0.0 0.0 - 0.2 %    Comment: Performed at [facility] [address] [place], [place] [address]  Glucose, capillary     Status: Abnormal   Collection Time: 06/15/20  6:28 AM  Result Value Ref Range   Glucose-Capillary 112 (H) 70 - 99 mg/dL    Comment: Glucose reference range applies only to samples taken after fasting for at least 8  hours.   No results found.     Medical Problem List and Plan: 1.  Decreased functional mobility secondary to left BKA 06/08/2020 after poor healing of transmetatarsal amputation  -patient may *** shower  -ELOS/Goals: *** 2.  Antithrombotics: -DVT/anticoagulation: Subcutaneous heparin  -antiplatelet therapy: Aspirin 81 mg daily and Plavix 75 mg daily 3. Pain Management: Oxycodone as needed 4. Mood: Provide emotional support  -antipsychotic agents: N/A 5. Neuropsych: This patient is capable of making decisions on her own behalf. 6. Skin/Wound Care: Routine skin checks 7. Fluids/Electrolytes/Nutrition: Routine in and outs with follow-up chemistries 8.  End-stage renal disease.  Continue hemodialysis as directed 9.  Acute on chronic anemia.  Follow-up CBC 10.  Diabetes mellitus.  Hemoglobin A1c 5.8.  Lantus insulin 20 units daily.  Check blood sugars before meals and at bedtime 11.  Hypertension.  Toprol-XL 50 mg daily, Norvasc 10 mg daily.  Monitor with increased mobility 12.  Hyperlipidemia.  Lipitor 13.  Constipation.  MiraLAX daily, Senokot S1 tablet twice daily ***  Cathlyn Parsons, PA-C 06/15/2020

## 2020-06-15 NOTE — Progress Notes (Signed)
[facility] Progress Note   Subjective:   Patient seen and examined at bedside.  Reports tolerating dialysis well yesterday.  Denies pain, SOB, CP, n/v/d and fatigue.  Waiting for bed in CIR.   Objective Vitals:   06/14/20 1454 06/14/20 2005 06/15/20 0354 06/15/20 0500  BP: (!) 133/50 (!) 143/53 (!) 142/47   Pulse: 79 70 80   Resp: '18 16 16   '$ Temp: 98 F (36.7 C) 99 F (37.2 C) 97.7 F (36.5 C)   TempSrc: Oral Oral Oral   SpO2: 91% 97% 96%   Weight:    102.4 kg  Height:       Physical Exam General:Well developed female in NAD Heart:RRR, no mrg Lungs:CTAB, nml WOB Abdomen:soft, NTND Extremities:L BKA, no edema b/l Dialysis Access: RU AVG +b   Filed Weights   06/14/20 0704 06/14/20 0954 06/15/20 0500  Weight: 102.9 kg 100.9 kg 102.4 kg    Intake/Output Summary (Last 24 hours) at 06/15/2020 0733 Last data filed at 06/15/2020 0000 Gross per 24 hour  Intake 363 ml  Output 2000 ml  Net -1637 ml    Additional Objective Labs: Basic Metabolic Panel: Recent Labs  Lab 06/13/20 0305 06/14/20 0451 06/15/20 0201  NA 130* 131* 133*  K 5.4* 4.9 4.7  CL 94* 95* 95*  CO2 '22 23 26  '$ GLUCOSE 92 101* 123*  BUN 70* 50* 42*  CREATININE 11.29* 8.63* 7.46*  CALCIUM 7.9* 8.1* 8.3*  PHOS 6.1* 6.1* 5.5*   Liver Function Tests: Recent Labs  Lab 06/13/20 0305 06/14/20 0451 06/15/20 0201  ALBUMIN 2.0* 2.0* 2.1*   CBC: Recent Labs  Lab 06/10/20 1212 06/11/20 0239 06/12/20 0405 06/13/20 0305 06/14/20 0451 06/15/20 0201  WBC 24.2* 18.4* 16.1* 15.5* 15.3* 14.7*  NEUTROABS 20.6*  --   --   --   --   --   HGB 9.0* 8.7* 8.8* 8.1* 8.1* 8.3*  HCT 29.8* 28.5* 27.7* 26.1* 26.9* 26.9*  MCV 82.1 81.7 81.5 80.3 82.5 82.5  PLT 675* 619* 654* 598* 563* 600*   CBG: Recent Labs  Lab 06/13/20 1951 06/14/20 1157 06/14/20 1633 06/14/20 2005 06/15/20 0628  GLUCAP 107* 115* 189* 103* 112*    Medications:  . (feeding supplement) PROSource Plus  30 mL Oral BID BM   . amLODipine  10 mg Oral QHS  . aspirin EC  81 mg Oral QHS  . atorvastatin  40 mg Oral QHS  . Chlorhexidine Gluconate Cloth  6 each Topical Q0600  . clopidogrel  75 mg Oral Daily  . heparin  5,000 Units Subcutaneous Q8H  . insulin aspart  0-15 Units Subcutaneous TID WC  . insulin glargine  20 Units Subcutaneous Daily  . metoprolol succinate  50 mg Oral Daily  . multivitamin  1 tablet Oral QHS  . polyethylene glycol  17 g Oral Daily  . senna-docusate  1 tablet Oral BID  . sevelamer carbonate  2,400 mg Oral TID WC  . sodium chloride flush  3 mL Intravenous Q12H    Dialysis Orders: [facility] MWF 4hrs 2K 2.5Ca EDW 101kg RU AVG Heparin 1000 load and 1000 units qhr EPO 14000 units qHD Venofer '100mg'$  qHD Hectorol 1.36mg qHD    Assessment/Plan: 1. 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 Waiting for bed placement in CIR. Per [facility] ok for d/c.  2. Leukocytosis - improving, WBC 14.7 today. BC negative.  S/p pre OP ancef. 3. ESRD- on MWF. tolerating dialysis well.K+ controlled, last  4.7.  Scheduled for HD today per regular schedule.  4. HTN/ volume -BP remains mildly elevated. Continue amlodipine and metoprolol.Does not appear grossly volume overloaded.  Per weights remains over EDW.  Plan for increased UF tomorrow.  5. DM2 - peradmitting team. 6. Anemia ckd -Hgb 8.3 today. On EPO qHD as OP, will start aranesp here with HD tomorrow.  7. MBD ckd -Calcium and phos in goal. Continue renvela and VDRA.  8. Nutrition - Renal diet with fluid restrictions. Protein supplements, Vitamins.   Jen Mow, PA-C [facility] 06/15/2020,7:33 AM  LOS: 7 days

## 2020-06-15 NOTE — Plan of Care (Signed)

## 2020-06-15 NOTE — Plan of Care (Signed)
  Problem: Education: Goal: Knowledge of General Education information will improve Description: Including pain rating scale, medication(s)/side effects and non-pharmacologic comfort measures Outcome: Progressing   Problem: Clinical Measurements: Goal: Respiratory complications will improve Outcome: Progressing   Problem: Activity: Goal: Risk for activity intolerance will decrease Outcome: Progressing   Problem: Nutrition: Goal: Adequate nutrition will be maintained Outcome: Progressing   Problem: Pain Managment: Goal: General experience of comfort will improve Outcome: Progressing   Problem: Safety: Goal: Ability to remain free from injury will improve Outcome: Progressing   

## 2020-06-15 NOTE — Progress Notes (Signed)
Inpatient Rehabilitation Admissions Coordinator                  I do not have a CIR bed to admit this patient to today. I have alerted acute team and TOC. I will follow up tomorrow.  Danne Baxter, RN, MSN Rehab Admissions Coordinator [phone] 06/15/2020 1:26 PM

## 2020-06-15 NOTE — Progress Notes (Signed)
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    PT Comments    Patient continues to be limited by fatigue. Patient unable to ambulate with hop to pattern this session, but able to pivot towards chair with minA. Patient able to perform repeated sit to stands with minA, good carryover of hand placement from previous session. Continue to recommend comprehensive inpatient rehab (CIR) for post-acute therapy needs.     Follow Up Recommendations  CIR     Equipment Recommendations  None recommended by PT    Recommendations for Other Services       Precautions / Restrictions Precautions Precautions: Fall Required Braces or Orthoses: Knee Immobilizer - Left Knee Immobilizer - Left: On at all times Restrictions Weight Bearing Restrictions: Yes LLE Weight Bearing: Non weight bearing    Mobility  Bed Mobility Overal bed mobility: Needs Assistance Bed Mobility: Supine to Sit     Supine to sit: Supervision;HOB elevated          Transfers Overall transfer level: Needs assistance Equipment used: Rolling Masen Salvas (2 wheeled) Transfers: Sit to/from [facility] Sit to Stand: Min assist Stand pivot transfers: Min assist       General transfer comment: sit to stand x 3 with minA to power up. Stand pivot to recliner with minA, patient unable to hop this session but able to pivot R foot towards bed  Ambulation/Gait                 Stairs             Wheelchair Mobility    Modified Rankin (Stroke Patients  Only)       Balance Overall balance assessment: Needs assistance Sitting-balance support: Single extremity supported Sitting balance-Leahy Scale: Fair Sitting balance - Comments: patient requires at least one UE support   Standing balance support: Bilateral upper extremity supported;During functional activity Standing balance-Leahy Scale: Poor Standing balance comment: reliant on UE support                            Cognition Arousal/Alertness: Awake/alert Behavior During Therapy: WFL for tasks assessed/performed Overall Cognitive Status: Within Functional Limits for tasks assessed                                        Exercises General Exercises - Lower Extremity Quad Sets: AROM;Both;10 reps;Supine Hip ABduction/ADduction: AROM;Both;10 reps;Supine Straight Leg Raises: AROM;Both;10 reps;Supine    General Comments        Pertinent Vitals/Pain Pain Assessment: Faces Faces Pain Scale: Hurts a little bit Pain Location: L residual limb Pain Descriptors / Indicators: Grimacing;Guarding Pain Intervention(s): Monitored during session;Repositioned    Home Living                      Prior Function            PT Goals (current goals can now be found in the care  plan section) Acute Rehab PT Goals Patient Stated Goal: go home PT Goal Formulation: With patient Time For Goal Achievement: 06/23/20 Potential to Achieve Goals: Good Progress towards PT goals: Progressing toward goals    Frequency    Min 3X/week      PT Plan Current plan remains appropriate    Co-evaluation              AM-PAC PT "6 Clicks" Mobility   Outcome Measure  Help needed turning from your back to your side while in a flat bed without using bedrails?: A Little Help needed moving from lying on your back to sitting on the side of a flat bed without using bedrails?: A Little Help needed moving to and from a bed to a chair (including a wheelchair)?: A  Little Help needed standing up from a chair using your arms (e.g., wheelchair or bedside chair)?: A Little Help needed to walk in hospital room?: A Little Help needed climbing 3-5 steps with a railing? : A Lot 6 Click Score: 17    End of Session Equipment Utilized During Treatment: Gait belt Activity Tolerance: Patient limited by fatigue Patient left: in chair;with call bell/phone within reach;with chair alarm set Nurse Communication: Mobility status PT Visit Diagnosis: Unsteadiness on feet (R26.81);Muscle weakness (generalized) (M62.81);Other abnormalities of gait and mobility (R26.89)     Time: [id number] PT Time Calculation (min) (ACUTE ONLY): 16 min  Charges:  $Therapeutic Activity: 8-22 mins                     Rosebud Koenen A. Gilford Rile PT, DPT Acute Rehabilitation Services Pager [phone] Office [phone]    Linna Hoff 06/15/2020, 12:54 PM

## 2020-06-16 ENCOUNTER — Inpatient Hospital Stay (HOSPITAL_COMMUNITY)
Admission: RE | Admit: 2020-06-16 | Discharge: 2020-07-04 | DRG: 559 | Disposition: A | Discharge status: Home Health | Payer: Medicare Other | Source: Intra-hospital | Attending: Physical Medicine and Rehabilitation | Admitting: Physical Medicine and Rehabilitation

## 2020-06-16 ENCOUNTER — Other Ambulatory Visit: Payer: Self-pay

## 2020-06-16 DIAGNOSIS — D75839 Thrombocytosis, unspecified: Secondary | ICD-10-CM | POA: Diagnosis present

## 2020-06-16 DIAGNOSIS — R34 Anuria and oliguria: Secondary | ICD-10-CM | POA: Diagnosis present

## 2020-06-16 DIAGNOSIS — I70202 Unspecified atherosclerosis of native arteries of extremities, left leg: Secondary | ICD-10-CM

## 2020-06-16 DIAGNOSIS — Z8249 Family history of ischemic heart disease and other diseases of the circulatory system: Secondary | ICD-10-CM | POA: Diagnosis not present

## 2020-06-16 DIAGNOSIS — I517 Cardiomegaly: Secondary | ICD-10-CM | POA: Diagnosis not present

## 2020-06-16 DIAGNOSIS — E118 Type 2 diabetes mellitus with unspecified complications: Secondary | ICD-10-CM

## 2020-06-16 DIAGNOSIS — I120 Hypertensive chronic kidney disease with stage 5 chronic kidney disease or end stage renal disease: Secondary | ICD-10-CM | POA: Diagnosis present

## 2020-06-16 DIAGNOSIS — I509 Heart failure, unspecified: Secondary | ICD-10-CM | POA: Diagnosis not present

## 2020-06-16 DIAGNOSIS — Z7902 Long term (current) use of antithrombotics/antiplatelets: Secondary | ICD-10-CM | POA: Diagnosis not present

## 2020-06-16 DIAGNOSIS — N186 End stage renal disease: Secondary | ICD-10-CM | POA: Diagnosis present

## 2020-06-16 DIAGNOSIS — Z4781 Encounter for orthopedic aftercare following surgical amputation: Principal | ICD-10-CM

## 2020-06-16 DIAGNOSIS — S88112A Complete traumatic amputation at level between knee and ankle, left lower leg, initial encounter: Secondary | ICD-10-CM | POA: Diagnosis not present

## 2020-06-16 DIAGNOSIS — Z992 Dependence on renal dialysis: Secondary | ICD-10-CM | POA: Diagnosis not present

## 2020-06-16 DIAGNOSIS — G546 Phantom limb syndrome with pain: Secondary | ICD-10-CM | POA: Diagnosis present

## 2020-06-16 DIAGNOSIS — I96 Gangrene, not elsewhere classified: Secondary | ICD-10-CM

## 2020-06-16 DIAGNOSIS — E1122 Type 2 diabetes mellitus with diabetic chronic kidney disease: Secondary | ICD-10-CM | POA: Diagnosis present

## 2020-06-16 DIAGNOSIS — E1169 Type 2 diabetes mellitus with other specified complication: Secondary | ICD-10-CM

## 2020-06-16 DIAGNOSIS — Z89512 Acquired absence of left leg below knee: Secondary | ICD-10-CM

## 2020-06-16 DIAGNOSIS — Z6841 Body Mass Index (BMI) 40.0 and over, adult: Secondary | ICD-10-CM

## 2020-06-16 DIAGNOSIS — E871 Hypo-osmolality and hyponatremia: Secondary | ICD-10-CM | POA: Diagnosis present

## 2020-06-16 DIAGNOSIS — J45909 Unspecified asthma, uncomplicated: Secondary | ICD-10-CM | POA: Diagnosis not present

## 2020-06-16 DIAGNOSIS — E1152 Type 2 diabetes mellitus with diabetic peripheral angiopathy with gangrene: Secondary | ICD-10-CM | POA: Diagnosis present

## 2020-06-16 DIAGNOSIS — D631 Anemia in chronic kidney disease: Secondary | ICD-10-CM | POA: Diagnosis present

## 2020-06-16 DIAGNOSIS — Z87891 Personal history of nicotine dependence: Secondary | ICD-10-CM | POA: Diagnosis not present

## 2020-06-16 DIAGNOSIS — Z79899 Other long term (current) drug therapy: Secondary | ICD-10-CM

## 2020-06-16 DIAGNOSIS — E669 Obesity, unspecified: Secondary | ICD-10-CM

## 2020-06-16 DIAGNOSIS — E11649 Type 2 diabetes mellitus with hypoglycemia without coma: Secondary | ICD-10-CM | POA: Diagnosis not present

## 2020-06-16 DIAGNOSIS — E11610 Type 2 diabetes mellitus with diabetic neuropathic arthropathy: Secondary | ICD-10-CM | POA: Diagnosis present

## 2020-06-16 DIAGNOSIS — Z833 Family history of diabetes mellitus: Secondary | ICD-10-CM | POA: Diagnosis not present

## 2020-06-16 DIAGNOSIS — E876 Hypokalemia: Secondary | ICD-10-CM | POA: Diagnosis present

## 2020-06-16 DIAGNOSIS — D638 Anemia in other chronic diseases classified elsewhere: Secondary | ICD-10-CM | POA: Diagnosis present

## 2020-06-16 DIAGNOSIS — I10 Essential (primary) hypertension: Secondary | ICD-10-CM

## 2020-06-16 DIAGNOSIS — E8889 Other specified metabolic disorders: Secondary | ICD-10-CM | POA: Diagnosis present

## 2020-06-16 DIAGNOSIS — Z89519 Acquired absence of unspecified leg below knee: Secondary | ICD-10-CM | POA: Diagnosis not present

## 2020-06-16 DIAGNOSIS — Z794 Long term (current) use of insulin: Secondary | ICD-10-CM

## 2020-06-16 DIAGNOSIS — E785 Hyperlipidemia, unspecified: Secondary | ICD-10-CM | POA: Diagnosis present

## 2020-06-16 DIAGNOSIS — Z7982 Long term (current) use of aspirin: Secondary | ICD-10-CM | POA: Diagnosis not present

## 2020-06-16 DIAGNOSIS — E875 Hyperkalemia: Secondary | ICD-10-CM | POA: Diagnosis not present

## 2020-06-16 DIAGNOSIS — N250 Renal osteodystrophy: Secondary | ICD-10-CM | POA: Diagnosis not present

## 2020-06-16 DIAGNOSIS — D72829 Elevated white blood cell count, unspecified: Secondary | ICD-10-CM

## 2020-06-16 DIAGNOSIS — E8809 Other disorders of plasma-protein metabolism, not elsewhere classified: Secondary | ICD-10-CM | POA: Diagnosis present

## 2020-06-16 DIAGNOSIS — E119 Type 2 diabetes mellitus without complications: Secondary | ICD-10-CM

## 2020-06-16 DIAGNOSIS — N189 Chronic kidney disease, unspecified: Secondary | ICD-10-CM | POA: Diagnosis not present

## 2020-06-16 DIAGNOSIS — I739 Peripheral vascular disease, unspecified: Secondary | ICD-10-CM | POA: Diagnosis not present

## 2020-06-16 LAB — RENAL FUNCTION PANEL
Albumin: 2.2 g/dL — ABNORMAL LOW (ref 3.5–5.0)
Albumin: 2.3 g/dL — ABNORMAL LOW (ref 3.5–5.0)
Anion gap: 14 (ref 5–15)
Anion gap: 14 (ref 5–15)
BUN: 60 mg/dL — ABNORMAL HIGH (ref 8–23)
BUN: 71 mg/dL — ABNORMAL HIGH (ref 8–23)
CO2: 24 mmol/L (ref 22–32)
CO2: 20 mmol/L — ABNORMAL LOW (ref 22–32)
Calcium: 8.2 mg/dL — ABNORMAL LOW (ref 8.9–10.3)
Calcium: 8.5 mg/dL — ABNORMAL LOW (ref 8.9–10.3)
Chloride: 92 mmol/L — ABNORMAL LOW (ref 98–111)
Chloride: 94 mmol/L — ABNORMAL LOW (ref 98–111)
Creatinine, Ser: 9.37 mg/dL — ABNORMAL HIGH (ref 0.44–1.00)
Creatinine, Ser: 10.59 mg/dL — ABNORMAL HIGH (ref 0.44–1.00)
GFR, Estimated: 4 mL/min — ABNORMAL LOW (ref >60)
GFR, Estimated: 4 mL/min — ABNORMAL LOW
Glucose, Bld: 137 mg/dL — ABNORMAL HIGH (ref 70–99)
Glucose, Bld: 155 mg/dL — ABNORMAL HIGH (ref 70–99)
Phosphorus: 6.2 mg/dL — ABNORMAL HIGH (ref 2.5–4.6)
Phosphorus: 6.8 mg/dL — ABNORMAL HIGH (ref 2.5–4.6)
Potassium: 4.9 mmol/L (ref 3.5–5.1)
Potassium: 5.3 mmol/L — ABNORMAL HIGH (ref 3.5–5.1)
Sodium: 130 mmol/L — ABNORMAL LOW (ref 135–145)
Sodium: 128 mmol/L — ABNORMAL LOW (ref 135–145)

## 2020-06-16 LAB — CBC
HCT: 27.6 % — ABNORMAL LOW (ref 36.0–46.0)
Hemoglobin: 8.6 g/dL — ABNORMAL LOW (ref 12.0–15.0)
MCH: 24.9 pg — ABNORMAL LOW (ref 26.0–34.0)
MCHC: 31.2 g/dL (ref 30.0–36.0)
MCV: 79.8 fL — ABNORMAL LOW (ref 80.0–100.0)
Platelets: 579 K/uL — ABNORMAL HIGH (ref 150–400)
RBC: 3.46 MIL/uL — ABNORMAL LOW (ref 3.87–5.11)
RDW: 16.7 % — ABNORMAL HIGH (ref 11.5–15.5)
WBC: 12.3 K/uL — ABNORMAL HIGH (ref 4.0–10.5)
nRBC: 0 % (ref 0.0–0.2)

## 2020-06-16 LAB — GLUCOSE, CAPILLARY
Glucose-Capillary: 116 mg/dL — ABNORMAL HIGH (ref 70–99)
Glucose-Capillary: 141 mg/dL — ABNORMAL HIGH (ref 70–99)
Glucose-Capillary: 127 mg/dL — ABNORMAL HIGH (ref 70–99)
Glucose-Capillary: 157 mg/dL — ABNORMAL HIGH (ref 70–99)

## 2020-06-16 MED ORDER — DIPHENHYDRAMINE HCL 12.5 MG/5ML PO ELIX: 12.5000 mg | ORAL_SOLUTION | Freq: Four times a day (QID) | ORAL | Status: DC | PRN

## 2020-06-16 MED ORDER — ATORVASTATIN CALCIUM 40 MG PO TABS
40.0000 mg | ORAL_TABLET | Freq: Every day | ORAL | Status: DC
  Administered 2020-06-16 – 2020-07-03 (×18): 40 mg via ORAL
  Filled 2020-06-16 (×18): qty 1

## 2020-06-16 MED ORDER — DARBEPOETIN ALFA 60 MCG/0.3ML IJ SOSY
60.0000 ug | PREFILLED_SYRINGE | INTRAMUSCULAR | Status: DC
  Administered 2020-06-17: 60 ug via INTRAVENOUS
  Filled 2020-06-16 (×3): qty 0.3

## 2020-06-16 MED ORDER — PROCHLORPERAZINE EDISYLATE 10 MG/2ML IJ SOLN: 5.0000 mg | Freq: Four times a day (QID) | INTRAMUSCULAR | Status: DC | PRN

## 2020-06-16 MED ORDER — PROCHLORPERAZINE 25 MG RE SUPP: 12.5000 mg | Freq: Four times a day (QID) | RECTAL | Status: DC | PRN

## 2020-06-16 MED ORDER — TRAZODONE HCL 50 MG PO TABS
25.0000 mg | ORAL_TABLET | Freq: Every evening | ORAL | Status: DC | PRN
  Filled 2020-06-16: qty 1

## 2020-06-16 MED ORDER — CHLORHEXIDINE GLUCONATE CLOTH 2 % EX PADS
6.0000 | MEDICATED_PAD | Freq: Every day | CUTANEOUS | Status: DC
  Administered 2020-06-17: 6 via TOPICAL

## 2020-06-16 MED ORDER — INSULIN GLARGINE 100 UNIT/ML ~~LOC~~ SOLN: 20.0000 [IU] | Freq: Every day | SUBCUTANEOUS | 11 refills | Status: DC

## 2020-06-16 MED ORDER — PROSOURCE PLUS PO LIQD
30.0000 mL | Freq: Two times a day (BID) | ORAL | Status: DC
  Administered 2020-06-17 – 2020-07-04 (×27): 30 mL via ORAL
  Filled 2020-06-16 (×27): qty 30

## 2020-06-16 MED ORDER — SENNOSIDES-DOCUSATE SODIUM 8.6-50 MG PO TABS: 1.0000 | ORAL_TABLET | Freq: Two times a day (BID) | ORAL | 0 refills | Status: DC

## 2020-06-16 MED ORDER — HEPARIN SODIUM (PORCINE) 5000 UNIT/ML IJ SOLN: 5000.0000 [IU] | Freq: Three times a day (TID) | INTRAMUSCULAR | Status: DC

## 2020-06-16 MED ORDER — GUAIFENESIN-DM 100-10 MG/5ML PO SYRP: 5.0000 mL | ORAL_SOLUTION | Freq: Four times a day (QID) | ORAL | Status: DC | PRN

## 2020-06-16 MED ORDER — POLYETHYLENE GLYCOL 3350 17 G PO PACK
17.0000 g | PACK | Freq: Every day | ORAL | Status: DC
  Administered 2020-06-17 – 2020-06-30 (×13): 17 g via ORAL
  Filled 2020-06-16 (×16): qty 1

## 2020-06-16 MED ORDER — ASPIRIN EC 81 MG PO TBEC
81.0000 mg | DELAYED_RELEASE_TABLET | Freq: Every day | ORAL | Status: DC
  Administered 2020-06-16 – 2020-07-03 (×18): 81 mg via ORAL
  Filled 2020-06-16 (×18): qty 1

## 2020-06-16 MED ORDER — SEVELAMER CARBONATE 800 MG PO TABS: 1600.0000 mg | ORAL_TABLET | ORAL | Status: DC | PRN

## 2020-06-16 MED ORDER — OXYCODONE-ACETAMINOPHEN 5-325 MG PO TABS
1.0000 | ORAL_TABLET | Freq: Three times a day (TID) | ORAL | Status: DC | PRN
  Administered 2020-06-17 – 2020-06-22 (×11): 1 via ORAL
  Filled 2020-06-16 (×12): qty 1

## 2020-06-16 MED ORDER — HEPARIN SODIUM (PORCINE) 1000 UNIT/ML DIALYSIS: 100.0000 [IU]/kg | INTRAMUSCULAR | Status: DC | PRN

## 2020-06-16 MED ORDER — BISACODYL 10 MG RE SUPP: 10.0000 mg | Freq: Every day | RECTAL | Status: DC | PRN

## 2020-06-16 MED ORDER — METOPROLOL SUCCINATE ER 50 MG PO TB24
50.0000 mg | ORAL_TABLET | Freq: Every day | ORAL | Status: DC
  Administered 2020-06-17 – 2020-07-04 (×16): 50 mg via ORAL
  Filled 2020-06-16 (×17): qty 1

## 2020-06-16 MED ORDER — PROCHLORPERAZINE MALEATE 5 MG PO TABS: 5.0000 mg | ORAL_TABLET | Freq: Four times a day (QID) | ORAL | Status: DC | PRN

## 2020-06-16 MED ORDER — DOXERCALCIFEROL 4 MCG/2ML IV SOLN
1.5000 ug | INTRAVENOUS | Status: DC
  Administered 2020-06-17 – 2020-07-03 (×6): 1.5 ug via INTRAVENOUS
  Filled 2020-06-16 (×8): qty 2

## 2020-06-16 MED ORDER — POLYETHYLENE GLYCOL 3350 17 G PO PACK: 17.0000 g | PACK | Freq: Every day | ORAL | 0 refills | Status: DC

## 2020-06-16 MED ORDER — SODIUM CHLORIDE 0.9 % IV SOLN: 100.0000 mL | INTRAVENOUS | Status: DC | PRN

## 2020-06-16 MED ORDER — SIMETHICONE 80 MG PO CHEW
80.0000 mg | CHEWABLE_TABLET | Freq: Four times a day (QID) | ORAL | Status: DC | PRN
  Administered 2020-06-17: 80 mg via ORAL
  Filled 2020-06-16: qty 1

## 2020-06-16 MED ORDER — SENNOSIDES-DOCUSATE SODIUM 8.6-50 MG PO TABS
1.0000 | ORAL_TABLET | Freq: Two times a day (BID) | ORAL | Status: DC
  Administered 2020-06-16 – 2020-07-04 (×35): 1 via ORAL
  Filled 2020-06-16 (×36): qty 1

## 2020-06-16 MED ORDER — SEVELAMER CARBONATE 800 MG PO TABS
2400.0000 mg | ORAL_TABLET | Freq: Three times a day (TID) | ORAL | Status: DC
  Administered 2020-06-16 – 2020-07-04 (×48): 2400 mg via ORAL
  Filled 2020-06-16 (×51): qty 3

## 2020-06-16 MED ORDER — POLYETHYLENE GLYCOL 3350 17 G PO PACK
17.0000 g | PACK | Freq: Every day | ORAL | Status: DC | PRN
Start: 2020-06-16 — End: 2020-07-04

## 2020-06-16 MED ORDER — LIDOCAINE HCL (PF) 1 % IJ SOLN: 5.0000 mL | INTRAMUSCULAR | Status: DC | PRN

## 2020-06-16 MED ORDER — LIDOCAINE-PRILOCAINE 2.5-2.5 % EX CREA: 1.0000 "application " | TOPICAL_CREAM | CUTANEOUS | Status: DC | PRN

## 2020-06-16 MED ORDER — INSULIN GLARGINE 100 UNIT/ML ~~LOC~~ SOLN
20.0000 [IU] | Freq: Every day | SUBCUTANEOUS | Status: DC
  Administered 2020-06-17 – 2020-06-30 (×14): 20 [IU] via SUBCUTANEOUS
  Filled 2020-06-16 (×14): qty 0.2

## 2020-06-16 MED ORDER — DULAGLUTIDE 1.5 MG/0.5ML ~~LOC~~ SOAJ
1.5000 mg | SUBCUTANEOUS | Status: DC
  Filled 2020-06-16 (×2): qty 0.5

## 2020-06-16 MED ORDER — ACETAMINOPHEN 325 MG PO TABS
325.0000 mg | ORAL_TABLET | ORAL | Status: DC | PRN
  Administered 2020-06-19: 325 mg via ORAL
  Administered 2020-06-19 – 2020-06-23 (×3): 650 mg via ORAL
  Administered 2020-06-23: 325 mg via ORAL
  Administered 2020-06-24 – 2020-07-03 (×6): 650 mg via ORAL
  Filled 2020-06-16 (×10): qty 2

## 2020-06-16 MED ORDER — RENA-VITE PO TABS
1.0000 | ORAL_TABLET | Freq: Every day | ORAL | Status: DC
  Administered 2020-06-16 – 2020-07-03 (×18): 1 via ORAL
  Filled 2020-06-16 (×18): qty 1

## 2020-06-16 MED ORDER — ENSURE MAX PROTEIN PO LIQD
11.0000 [oz_av] | Freq: Every day | ORAL | Status: DC
  Administered 2020-06-17 – 2020-07-03 (×5): 11 [oz_av] via ORAL

## 2020-06-16 MED ORDER — HEPARIN SODIUM (PORCINE) 5000 UNIT/ML IJ SOLN
5000.0000 [IU] | Freq: Three times a day (TID) | INTRAMUSCULAR | Status: DC
  Administered 2020-06-16 – 2020-07-04 (×44): 5000 [IU] via SUBCUTANEOUS
  Filled 2020-06-16 (×47): qty 1

## 2020-06-16 MED ORDER — HEPARIN SODIUM (PORCINE) 1000 UNIT/ML DIALYSIS: 1000.0000 [IU] | INTRAMUSCULAR | Status: DC | PRN

## 2020-06-16 MED ORDER — ALTEPLASE 2 MG IJ SOLR: 2.0000 mg | Freq: Once | INTRAMUSCULAR | Status: DC | PRN

## 2020-06-16 MED ORDER — AMLODIPINE BESYLATE 10 MG PO TABS
10.0000 mg | ORAL_TABLET | Freq: Every day | ORAL | Status: DC
Start: 2020-06-16 — End: 2020-07-01
  Administered 2020-06-17 – 2020-06-30 (×14): 10 mg via ORAL
  Filled 2020-06-16 (×15): qty 1

## 2020-06-16 MED ORDER — DIPHENHYDRAMINE HCL 25 MG PO CAPS: 25.0000 mg | ORAL_CAPSULE | Freq: Four times a day (QID) | ORAL | Status: DC | PRN

## 2020-06-16 MED ORDER — PENTAFLUOROPROP-TETRAFLUOROETH EX AERO: 1.0000 "application " | INHALATION_SPRAY | CUTANEOUS | Status: DC | PRN

## 2020-06-16 MED ORDER — ALUMINUM HYDROXIDE GEL 320 MG/5ML PO SUSP
5.0000 mL | Freq: Four times a day (QID) | ORAL | Status: DC | PRN
  Administered 2020-06-17 – 2020-06-21 (×2): 5 mL via ORAL
  Filled 2020-06-16 (×5): qty 30

## 2020-06-16 MED ORDER — MILK AND MOLASSES ENEMA
1.0000 | Freq: Every day | RECTAL | Status: DC | PRN
  Filled 2020-06-16: qty 240

## 2020-06-16 MED ORDER — CLOPIDOGREL BISULFATE 75 MG PO TABS
75.0000 mg | ORAL_TABLET | Freq: Every day | ORAL | Status: DC
  Administered 2020-06-17 – 2020-07-04 (×18): 75 mg via ORAL
  Filled 2020-06-16 (×18): qty 1

## 2020-06-16 NOTE — Progress Notes (Signed)
Rodell Perna, RN called and notified the pt's HD tx has been moved to 06/17/2020.

## 2020-06-16 NOTE — Progress Notes (Signed)
This nurse has been informed by HD charge nurse that pt HD tx has been moved to 06/17/20

## 2020-06-16 NOTE — Progress Notes (Signed)
Inpatient Rehabilitation Medication Review by a Pharmacist  A complete drug regimen review was completed for this patient to identify any potential clinically significant medication issues.  Clinically significant medication issues were identified:  No  Check AMION for pharmacist assigned to patient if future medication questions/issues arise during this admission.  Time spent performing this drug regimen review (minutes):  [place], PharmD, BCPS, [facility] Clinical Pharmacist 06/16/2020 4:05 PM

## 2020-06-16 NOTE — Progress Notes (Signed)
Meredith Staggers, MD  Physician  Physical Medicine and Rehabilitation  PMR Pre-admission     Signed  Date of Service:  06/10/2020  8:10 PM      Related encounter: Admission (Current) from 06/08/2020 in [place]          Show:Clear all _0 Manual_1 Template_2 Copied  Added by: _3 Cristina Gong, RN_4 Lind Covert, Lauren Mamie Nick, CCC-SLP_5 Meredith Staggers, MD   _6 [place]over for details  PMR Admission Coordinator Pre-Admission Assessment   Patient: Christy Zhang is an 65 y.o., female MRN: [id number] DOB: 11/04/1955 Height: _7  (162.6 cm) Weight: 103.1 kg   Insurance Information HMO:     PPO:      PCP:      IPA:      80/20:      OTHER:  PRIMARY: Medicare A & B      Policy#: [id number]      Subscriber: patient C Benefits:  Phone #: verified eligibility via [employer] on 06/13/20     Name:  Eff. Date: Part A & B effective; 11/28/14     Deduct: $1,556      Out of Pocket Max: NA      Life Max: NA CIR: 100% with Medicare approval   SNF: 100% days 1-20, 80% days 21-100 Outpatient: 80%     Co-Pay: 20% Home Health: 100%      Co-Pay:  DME: 80%     Co-Pay: 20%   Providers: pt's choice  SECONDARY: Medicaid of [place]      Policy#: [id number]     Phone#: [phone] 2/15 MADQN   The "Data Collection Information Summary" for patients in Inpatient Rehabilitation Facilities with attached "Privacy Act [place] Records" was provided and verbally reviewed with: Patient   Emergency Contact Information         Contact Information     Name Relation Home Work [place] Daughter [phone]   [phone]         Current Medical History  Patient Admitting Diagnosis: s/p L BKA   History of Present Illness: 65 year old female with medical history significant for ESRD on Hemodialysis, IDDM, HTN, PVD and gangrene requiring amputation presented on 06/08/2020 for non healing left transmetatarsal amputation.  History of angioplasty of the left anterior tibial artery on 05/26/2019, but subsequently underwent transmet on the following day. Surgery for BKA postponed after patient developed COVID on 05/02/2020.    Presented on 06/08/2020 and underwent BKA to left leg with Dr. Stanford Breed. Postoperatively receiving hemodialysis per Nephrology and pain management per vascular. Hemodialysis on MWF schedule. Essential HTN with meds of Toprol XL and amlodipine. Continue Plavix, ASA and statin for PVD. Home regimen with type 2 DM and SSI coverage as needed.    Patient's medical record from [facility] has been reviewed by the rehabilitation admission coordinator and physician.   Past Medical History      Past Medical History:  Diagnosis Date  . Anemia    . Arthritis    . Asthma      in the past  . CHF (congestive heart failure) ([place])    . Chronic kidney disease      Renal failure, dialysis on M/W/F  . Diabetes mellitus with complication (HCC)      Type 2  . End stage renal disease ([place])    . End stage renal disease on dialysis ([place])    . Hemodialysis access,  arteriovenous graft ([place])    . Hyperlipidemia    . Hypertension    . Neuromuscular disorder ([place])        Family History   family history includes Cancer in her father; Diabetes in her daughter, father, mother, and son; Heart disease in her daughter and son; Hypertension in her daughter, mother, sister, and son; Peripheral vascular disease in her son.   Prior Rehab/Hospitalizations Has the patient had prior rehab or hospitalizations prior to admission? Yes   Has the patient had major surgery during 100 days prior to admission? Yes              Current Medications   Current Facility-Administered Medications:  .  (feeding supplement) PROSource Plus liquid 30 mL, 30 mL, Oral, BID BM, Penninger, Lindsay, PA, 30 mL at 06/16/20 1000 .  acetaminophen (TYLENOL) tablet 650 mg, 650 mg, Oral, Q6H PRN, 650 mg at 06/13/20 0709 **OR** acetaminophen  (TYLENOL) suppository 650 mg, 650 mg, Rectal, Q6H PRN, Smith, Rondell A, MD .  amLODipine (NORVASC) tablet 10 mg, 10 mg, Oral, QHS, Smith, Rondell A, MD, 10 mg at 06/15/20 2128 .  aspirin EC tablet 81 mg, 81 mg, Oral, QHS, Smith, Rondell A, MD, 81 mg at 06/15/20 2128 .  atorvastatin (LIPITOR) tablet 40 mg, 40 mg, Oral, QHS, Smith, Rondell A, MD, 40 mg at 06/15/20 2128 .  Chlorhexidine Gluconate Cloth 2 % PADS 6 each, 6 each, Topical, Q0600, Roney Jaffe, MD, 6 each at 06/16/20 0601 .  Chlorhexidine Gluconate Cloth 2 % PADS 6 each, 6 each, Topical, Q0600, Penninger, Lindsay, [place], 6 each at 06/16/20 0601 .  clopidogrel (PLAVIX) tablet 75 mg, 75 mg, Oral, Daily, Smith, Rondell A, MD, 75 mg at 06/16/20 1002 .  Darbepoetin Alfa (ARANESP) injection 60 mcg, 60 mcg, Intravenous, Q Fri-HD, Penninger, Lindsay, PA .  diphenhydrAMINE (BENADRYL) capsule 25 mg, 25 mg, Oral, Q6H PRN, Smith, Rondell A, MD .  doxercalciferol (HECTOROL) injection 1.5 mcg, 1.5 mcg, Intravenous, Q M,W,F-HD, Penninger, Lindsay, PA .  heparin injection 5,000 Units, 5,000 Units, Subcutaneous, Q8H, Smith, Rondell A, MD, 5,000 Units at 06/16/20 0600 .  insulin aspart (novoLOG) injection 0-15 Units, 0-15 Units, Subcutaneous, TID WC, Smith, Rondell A, MD, 3 Units at 06/15/20 1800 .  insulin glargine (LANTUS) injection 20 Units, 20 Units, Subcutaneous, Daily, Fuller Plan A, MD, 20 Units at 06/16/20 1001 .  metoprolol succinate (TOPROL-XL) 24 hr tablet 50 mg, 50 mg, Oral, Daily, Shahmehdi, Seyed A, MD, 50 mg at 06/16/20 1002 .  multivitamin (RENA-VIT) tablet 1 tablet, 1 tablet, Oral, QHS, Smith, Rondell A, MD, 1 tablet at 06/15/20 2128 .  oxyCODONE-acetaminophen (PERCOCET/ROXICET) 5-325 MG per tablet 1 tablet, 1 tablet, Oral, Q8H PRN, Fuller Plan A, MD, 1 tablet at 06/13/20 2224 .  polyethylene glycol (MIRALAX / GLYCOLAX) packet 17 g, 17 g, Oral, Daily, Shahmehdi, Seyed A, MD, 17 g at 06/15/20 1025 .  senna-docusate (Senokot-S) tablet 1  tablet, 1 tablet, Oral, BID, Shahmehdi, Seyed A, MD, 1 tablet at 06/16/20 1002 .  sevelamer carbonate (RENVELA) tablet 1,600 mg, 1,600 mg, Oral, PRN, Tamala Julian, Rondell A, MD .  sevelamer carbonate (RENVELA) tablet 2,400 mg, 2,400 mg, Oral, TID WC, Smith, Rondell A, MD, 2,400 mg at 06/16/20 0900 .  sodium chloride flush (NS) 0.9 % injection 3 mL, 3 mL, Intravenous, Q12H, Smith, Rondell A, MD, 3 mL at 06/16/20 1003   Patients Current Diet:     Diet Order  Diet renal/carb modified with fluid restriction Diet-HS Snack? Nothing; Fluid restriction: 1200 mL Fluid; Room service appropriate? Yes; Fluid consistency: Thin  Diet effective now                      Precautions / Restrictions Precautions Precautions: Fall Restrictions Weight Bearing Restrictions: Yes LLE Weight Bearing: Non weight bearing    Has the patient had 2 or more falls or a fall with injury in the past year? No   Prior Activity Level Community (5-7x/wk): went somewhere daily, dialysis 3x/week   Prior Functional Level Self Care: Did the patient need help bathing, dressing, using the toilet or eating? Independent   Indoor Mobility: Did the patient need assistance with walking from room to room (with or without device)? Independent   Stairs: Did the patient need assistance with internal or external stairs (with or without device)? Needed some help   Functional Cognition: Did the patient need help planning regular tasks such as shopping or remembering to take medications? Independent   Home Assistive Devices / Equipment Home Assistive Devices/Equipment: CBG Meter,Wheelchair,Walker (specify type),Eyeglasses,Scales Home Equipment: Walker - 2 wheels,Tub bench,Hand held shower head,Wheelchair - manual   Prior Device Use: Indicate devices/aids used by the patient prior to current illness, exacerbation or injury? Manual wheelchair   Current Functional Level Cognition   Overall Cognitive Status: Within  Functional Limits for tasks assessed Orientation Level: Oriented X4    Extremity Assessment (includes Sensation/Coordination)   Upper Extremity Assessment: Defer to OT evaluation  Lower Extremity Assessment: Generalized weakness     ADLs   Overall ADL's : Needs assistance/impaired Eating/Feeding: Set up,Independent,Sitting Grooming: Wash/dry hands,Wash/dry face,Sitting,Supervision/safety,Set up Upper Body Bathing: Supervision/ safety,Set up,Sitting Upper Body Bathing Details (indicate cue type and reason): simulated seated in recliner Lower Body Bathing: Sitting/lateral leans,Maximal assistance Lower Body Bathing Details (indicate cue type and reason): simulated via LB dressing from EOB Upper Body Dressing : Supervision/safety,Set up,Sitting Upper Body Dressing Details (indicate cue type and reason): donned clean gown Lower Body Dressing: Maximal assistance,Sitting/lateral leans Lower Body Dressing Details (indicate cue type and reason): to reposition KI from EOB Toilet Transfer: Moderate assistance,RW Toilet Transfer Details (indicate cue type and reason): unable to pivot but MOD A to rise into standing Toileting- Clothing Manipulation and Hygiene: Minimal assistance,Sitting/lateral lean Functional mobility during ADLs: Moderate assistance,Rolling walker (sit<>stand only) General ADL Comments: pt limited by decreased activity tolerance, impaired balance and generlized weakness     Mobility   Overal bed mobility: Needs Assistance Bed Mobility: Supine to Sit Supine to sit: Supervision,HOB elevated Sit to supine: Min guard General bed mobility comments: min guard for safety with HOB elevated     Transfers   Overall transfer level: Needs assistance Equipment used: Rolling walker (2 wheeled) Transfers: Sit to/from [employer] Sit to Stand: Min assist Stand pivot transfers: Min assist General transfer comment: sit to stand x 3 with minA to power up. Stand pivot to  recliner with minA, patient unable to hop this session but able to pivot R foot towards bed     Ambulation / Gait / Stairs / Wheelchair Mobility   Ambulation/Gait Ambulation/Gait assistance: Counsellor (Feet): 2 Feet Assistive device: Rolling walker (2 wheeled) Gait Pattern/deviations: Step-to pattern (hop to pattern.) General Gait Details: pivoting foot towards HOB for repositioning, unable to hop on R LE this session     Posture / Balance Dynamic Sitting Balance Sitting balance - Comments: patient requires at least one UE support Balance  Overall balance assessment: Needs assistance Sitting-balance support: Single extremity supported Sitting balance-Leahy Scale: Fair Sitting balance - Comments: patient requires at least one UE support Standing balance support: Bilateral upper extremity supported,During functional activity Standing balance-Leahy Scale: Poor Standing balance comment: reliant on UE support     Special needs/care consideration Designated visitor is daughter, [name] Hemodialysis MWF as outpatient Daughter, [name], lives with patient and is her aide 4 hrs per day and provides her transportation to outpatient hemodialysis    Previous Home Environment  Living Arrangements: Children,Other relatives (2 grandsons)  Lives With: Daughter Available Help at Discharge: Family Type of Home: [place]: Two level,Able to live on main level with bedroom/bathroom Home Access: Ramped entrance Bathroom Shower/Tub: Tub/shower unit (pt takes sponge baths) Bathroom Toilet: Standard (pt uses bedside commode) Bathroom Accessibility: Yes How Accessible: Accessible via walker [place]: Yes Type of [place] (if known): [facility]   Discharge Living Setting Plans for Discharge Living Setting: Patient's home Type of Home at Discharge: House Discharge Home Layout: Two level,Able to live on main level with  bedroom/bathroom Discharge Home Access: Ramped entrance Discharge Bathroom Shower/Tub: Tub/shower unit (pt takes sponge baths) Discharge Bathroom Toilet: Standard (pt uses bedside commode) Discharge Bathroom Accessibility: Yes How Accessible: Accessible via walker Does the patient have any problems obtaining your medications?: No   Social/Family/Support Systems Anticipated Caregiver: [name], daughter Anticipated Caregiver's Contact Information: [phone] Caregiver Availability: 24/7 Discharge Plan Discussed with Primary Caregiver: Yes Is Caregiver In Agreement with Plan?: Yes Does Caregiver/Family have Issues with Lodging/Transportation while Pt is in Rehab?: No   Goals Patient/Family Goal for Rehab: Mod I with PT And OT at wheelchair level Expected length of stay: ELOS 7 to 10 days   Decrease burden of Care through IP rehab admission: NA   Possible need for SNF placement upon discharge: Not anticipated   Patient Condition: I have reviewed medical records from [facility], spoken with  patient. I met with patient at the bedside for inpatient rehabilitation assessment.  Patient will benefit from ongoing PT and OT, can actively participate in 3 hours of therapy a day 5 days of the week, and can make measurable gains during the admission.  Patient will also benefit from the coordinated team approach during an Inpatient Acute Rehabilitation admission.  The patient will receive intensive therapy as well as Rehabilitation physician, nursing, social worker, and care management interventions.  Due to bladder management, bowel management, safety, skin/wound care, disease management, medication administration, pain management and patient education the patient requires 24 hour a day rehabilitation nursing.  The patient is currently mod assist overall with mobility and basic ADLs.  Discharge setting and therapy post discharge at home with home health is anticipated.  Patient has agreed to  participate in the Acute Inpatient Rehabilitation Program and will admit today.   Preadmission Screen Completed By:  Cleatrice Burke, 06/16/2020 10:32 AM ______________________________________________________________________   Discussed status with Dr. Naaman Plummer  on  06/16/2020 at  1035 and received approval for admission today.   Admission Coordinator:  Cleatrice Burke, RN, time  2035 Date  06/16/2020    Assessment/Plan: Diagnosis: left bka 1. Does the need for close, 24 hr/day Medical supervision in concert with the patient's rehab needs make it unreasonable for this patient to be served in a less intensive setting? Yes 2. Co-Morbidities requiring supervision/potential complications: ESRD on HD, DM, HTN 3. Due to bladder management, bowel management, safety, skin/wound  care, disease management, medication administration, pain management and patient education, does the patient require 24 hr/day rehab nursing? Yes 4. Does the patient require coordinated care of a physician, rehab nurse, PT, OT, and SLP to address physical and functional deficits in the context of the above medical diagnosis(es)? Yes Addressing deficits in the following areas: balance, endurance, locomotion, strength, transferring, bowel/bladder control, bathing, dressing, feeding, grooming, toileting and psychosocial support 5. Can the patient actively participate in an intensive therapy program of at least 3 hrs of therapy 5 days a week? Yes 6. The potential for patient to make measurable gains while on inpatient rehab is excellent 7. Anticipated functional outcomes upon discharge from inpatient rehab: modified independent PT, modified independent OT, n/a SLP wc level goals 8. Estimated rehab length of stay to reach the above functional goals is: 7-10 days 9. Anticipated discharge destination: Home 10. Overall Rehab/Functional Prognosis: excellent     MD Signature: Meredith Staggers, MD, [place]  Physical Medicine & Rehabilitation 06/16/2020           Revision History                                            Note Details  Author Meredith Staggers, MD File Time 06/16/2020 10:45 AM  Author Type Physician Status Signed  Last Editor Meredith Staggers, MD Service Physical Medicine and Rehabilitation

## 2020-06-16 NOTE — Progress Notes (Signed)
Pt transferred to 4M07 as ordered. Pt remains alert/oriented in no apparent distress. No complaints voiced.

## 2020-06-16 NOTE — Progress Notes (Signed)
PROGRESS NOTE    Patient: Christy Zhang                            PCP: Claretta Fraise, MD                    DOB: [birth year]            DOA: 06/08/2020 [id number]             DOS: 06/16/2020, 8:18 AM   LOS: 8 days   Date of Service: The patient was seen and examined on 06/16/2020  Subjective:   POD # 7 Left BKA Seen and examined.  She has no complaints.  Brief Narrative:    Christy Zhang is a 65 y.o. female with medical history significant of ESRD on HD, IDDM, HTN, HLD, PVD, and gangrene requiring prior amputation presented for nonhealing left transmetatarsal amputation.   Patient had been hospitalized in 01/2020 after being found to have gangrene of her left toes.  She underwent angioplasty of the left anterior tibial artery on 02/23/2020, but subsequently underwent transmetatarsal amputation of the left the following day.  Patient had home health coming out to do dressing changes twice a week.  However, when the patient followed back up with vascular surgery it was noted that the wound was not healing appropriately and it was recommended that she have a below-knee amputation.  Surgery had initially been scheduled for 04/11/2020, but patient wanted to have a second opinion with the wound care doctor.  After being evaluated by the wound care specialist patient was advised that wound will likely not heal.  Surgery was postponed after patient developed Covid on 05/02/2020, and then subsequently on 05/16/2020 due to incliment weather.    On 06/08/2020 the  patient was admitted and underwent below-knee amputation of the left leg with Dr. Stanford Breed with less than 50 mL of blood loss..  Labs prior to the procedure were significant for WBC 17.1, hemoglobin 9.4, platelets 646, BUN 17, creatinine 6.21, and albumin 2.5.  Cultures remain negative.  Leukocytosis improving.  Nephrology was consulted.  She was getting hemodialysis during hospitalization.  Seen by PT OT who recommended  CIR.   Assessment & Plan:   Principal Problem:   S/P BKA (below knee amputation) unilateral (HCC) Active Problems:   ESRD (end stage renal disease) ([place])   Controlled type 2 diabetes mellitus with chronic kidney disease on chronic dialysis, with long-term current use of insulin (HCC)   Essential (primary) hypertension   Obesity, Class III, BMI 40-49.9 (morbid obesity) ([place])   Gangrene (HCC)    Assessment/Plan  Gangrene of the left forefoot  POD # 7 Left BKA -Stable no distress. -  nonhealing transmetatarsal amputation of the left foot with gangrene.   - Left below-knee amputation performed by Dr. Stanford Breed of vascular  - S/P left below-knee amputation: 06/08/2020.  Doing well.  No complaints. -Continue with pain management, oxycodone, as needed IV narcotics -Postop care per vascular -PT/OT recommends CIR, CIR consulted.  Waiting for bed availability.  Leukocytosis:  -Monitoring, improving -No signs of sepsis afebrile normotensive - WBC elevated at 17.1 >> 22.2  >>> 18.4> 16.1> 15.5> 15.3 -Following blood cultures >>> no growth to date  ESRD on HD:  -Patient normally dialyzes Monday, Wednesday, and Friday.   Dialysis per nephrology.  Appreciate help.  Essential hypertension:  Home blood pressure medications include metoprolol succinate 100 mg daily and amlodipine 10  mg nightly.  Reportedly had some issues with the bradycardia so Toprol-XL was discontinued.  Later it was resumed back at lower dose of 50 mg p.o. daily.  She also remains on amlodipine.  Blood pressure better controlled.  Anemia of chronic disease -with chronic kidney disease  Hemoglobin is stable.  Diabetes mellitus type II,  -Seems to be well controlled -Check an A1c 5.8, last A1c 6.4 12/29/2019 -Checking CBG QA CHS, SSI coverage -  Home regimen includes glargine 100 units daily and Trulicity 1.5 mg weekly. -Continue with reduced  home Lantus 20 unit units daily while in the hospital given more controlled  diet.  PVD -Continue Plavix, aspirin and statin -Remained stable, vascular following  Hyperlipidemia -Continue home atorvastatin 40 mg nightly  Hypoalbuminemia: Acute.   Albumin noted 2.5, 2.2  -Monitoring replete as needed  Morbid obesity BMI 40.34 kg/m -Patient has been counseled regarding aggressive weight loss -Dietary education  ------------------------------------------------------------------------------------------------------------------------------------ Nutritional status:  The patient's BMI is: Body mass index is 39.01 kg/m. I agree with the assessment and plan as outlined below:  Skin Assessment: I have examined the patient's skin and I agree with the wound assessment as performed by wound care team As outlined ....      ------------------------------------------------------------------------------------------------------------------------------------ Cultures; Blood Cultures x 2 >> NGTD  Antimicrobials: 06/08/2020 Ancef >>> 06/10/2020  Consultants: Vascular surgery and nephrology  ------------------------------------------------------------------------------------------------------------------------------------  DVT prophylaxis:  Heparin SQ Code Status:   Code Status: Full Code  Family Communication: Discussed with patient in detail no family member present at bedside-  -Advance care planning has been discussed.   Admission status:   Status is: Inpatient Level of care: Telemetry Medical  Remains inpatient appropriate because:Inpatient level of care appropriate due to severity of illness   Dispo: The patient is from: Home              Anticipated d/c is to: Status post PT OT evaluation, CIR recommended, waiting for placement to CIR.              Anticipated d/c date is: Likely CIR in 1 to 2 days              Patient currently is medically stable to d/c.   Difficult to place patient No     Procedures:   AMPUTATION BELOW  KNEE     Antimicrobials:  Anti-infectives (From admission, onward)   Start     Dose/Rate Route Frequency Ordered Stop   06/10/20 1230  ceFAZolin (ANCEF) IVPB 1 g/50 mL premix        1 g 100 mL/hr over 30 Minutes Intravenous Every 24 hours 06/09/20 1507 06/11/20 1243   06/09/20 1000  ceFAZolin (ANCEF) IVPB 1 g/50 mL premix  Status:  Discontinued        1 g 100 mL/hr over 30 Minutes Intravenous Every 12 hours 06/09/20 0723 06/09/20 1507   06/08/20 0549  ceFAZolin (ANCEF) IVPB 2g/100 mL premix        2 g 200 mL/hr over 30 Minutes Intravenous 30 min pre-op 06/08/20 0549 06/08/20 0809       Medication:  . (feeding supplement) PROSource Plus  30 mL Oral BID BM  . amLODipine  10 mg Oral QHS  . aspirin EC  81 mg Oral QHS  . atorvastatin  40 mg Oral QHS  . Chlorhexidine Gluconate Cloth  6 each Topical Q0600  . Chlorhexidine Gluconate Cloth  6 each Topical Q0600  . clopidogrel  75 mg Oral Daily  . darbepoetin (  ARANESP) injection - DIALYSIS  60 mcg Intravenous Q Fri-HD  . doxercalciferol  1.5 mcg Intravenous Q M,W,F-HD  . heparin  5,000 Units Subcutaneous Q8H  . insulin aspart  0-15 Units Subcutaneous TID WC  . insulin glargine  20 Units Subcutaneous Daily  . metoprolol succinate  50 mg Oral Daily  . multivitamin  1 tablet Oral QHS  . polyethylene glycol  17 g Oral Daily  . senna-docusate  1 tablet Oral BID  . sevelamer carbonate  2,400 mg Oral TID WC  . sodium chloride flush  3 mL Intravenous Q12H    acetaminophen **OR** acetaminophen, diphenhydrAMINE, oxyCODONE-acetaminophen, sevelamer carbonate   Objective:   Vitals:   06/15/20 1936 06/16/20 0500 06/16/20 0515 06/16/20 0515  BP: (!) 153/62  (!) 144/54 (!) 144/54  Pulse: 82  76 76  Resp: '18  18 18  '$ Temp: 97.9 F (36.6 C)  98.5 F (36.9 C) 98.5 F (36.9 C)  TempSrc: Oral  Oral Oral  SpO2: 100%  95% 95%  Weight:  103.1 kg    Height:       No intake or output data in the 24 hours ending 06/16/20 0818 Filed Weights    06/14/20 0954 06/15/20 0500 06/16/20 0500  Weight: 100.9 kg 102.4 kg 103.1 kg     Examination:   General exam: Appears calm and comfortable  Respiratory system: Clear to auscultation. Respiratory effort normal. Cardiovascular system: S1 & S2 heard, RRR. No JVD, murmurs, rubs, gallops or clicks. No pedal edema. Gastrointestinal system: Abdomen is nondistended, soft and nontender. No organomegaly or masses felt. Normal bowel sounds heard. Central nervous system: Alert and oriented. No focal neurological deficits. Extremities: Left BKA Skin: No rashes, lesions or ulcers.  Psychiatry: Judgement and insight appear poor ------------------------------------------------------------------------------------------------------------------------------------------    LABs:  CBC Latest Ref Rng & Units 06/15/2020 06/14/2020 06/13/2020  WBC 4.0 - 10.5 K/uL 14.7(H) 15.3(H) 15.5(H)  Hemoglobin 12.0 - 15.0 g/dL 8.3(L) 8.1(L) 8.1(L)  Hematocrit 36.0 - 46.0 % 26.9(L) 26.9(L) 26.1(L)  Platelets 150 - 400 K/uL 600(H) 563(H) 598(H)   CMP Latest Ref Rng & Units 06/16/2020 06/15/2020 06/14/2020  Glucose 70 - 99 mg/dL 137(H) 123(H) 101(H)  BUN 8 - 23 mg/dL 60(H) 42(H) 50(H)  Creatinine 0.44 - 1.00 mg/dL 9.37(H) 7.46(H) 8.63(H)  Sodium 135 - 145 mmol/L 130(L) 133(L) 131(L)  Potassium 3.5 - 5.1 mmol/L 4.9 4.7 4.9  Chloride 98 - 111 mmol/L 92(L) 95(L) 95(L)  CO2 22 - 32 mmol/L '24 26 23  '$ Calcium 8.9 - 10.3 mg/dL 8.2(L) 8.3(L) 8.1(L)  Total Protein 6.5 - 8.1 g/dL - - -  Total Bilirubin 0.3 - 1.2 mg/dL - - -  Alkaline Phos 38 - 126 U/L - - -  AST 15 - 41 U/L - - -  ALT 0 - 44 U/L - - -       Micro Results Recent Results (from the past 240 hour(s))  Culture, blood (routine x 2)     Status: None   Collection Time: 06/09/20 11:21 AM   Specimen: BLOOD LEFT HAND  Result Value Ref Range Status   Specimen Description BLOOD LEFT HAND  Final   Special Requests   Final    BOTTLES DRAWN AEROBIC ONLY Blood Culture  results may not be optimal due to an inadequate volume of blood received in culture bottles   Culture   Final    NO GROWTH 5 DAYS Performed at [facility], [place] [address], [place], [place] [address]  Report Status 06/14/2020 FINAL  Final  Culture, blood (routine x 2)     Status: None   Collection Time: 06/09/20 11:40 AM   Specimen: BLOOD LEFT HAND  Result Value Ref Range Status   Specimen Description BLOOD LEFT HAND  Final   Special Requests   Final    BOTTLES DRAWN AEROBIC ONLY Blood Culture results may not be optimal due to an inadequate volume of blood received in culture bottles   Culture   Final    NO GROWTH 5 DAYS Performed at [facility], [place] [address], [place], [place] [address]    Report Status 06/14/2020 FINAL  Final    Radiology Reports No results found.  SIGNED: Darliss Cheney, MD,  [facility],  Pager (please use [email or web address] to page/text) Please use Epic Secure Chat for non-urgent communication (7AM-7PM)  If 7PM-7AM, please contact night-coverage [email or web address], 06/16/2020, 8:18 AM

## 2020-06-16 NOTE — H&P (Signed)
Physical Medicine and Rehabilitation Admission H&P    CC: Functional deficits due to L-BKA and weakness.    HPI:  Christy Zhang. [name] is a 65 year old female with history of ESRD-HD MWF, T2DM, HTN, morbid obesity--BMI 39, PVD with gangrenous changes left foot and multiple attempts at limb salvage. BKA was recommended in December but delayed to to request for second opinion, Covid infection and inclement weather. She was admitted on 06/08/20 for L-BKA by Dr. Stanford Breed. Post op course significant for LLE pain, leucocytosis with rise in WBC to 24.2--blood cultures X 2 negative. She was maintained on pre-op cefazolin thorough 02/13 and metoprolol resumed as bradycardia resolved. Nephrology following for management of HD MWF. Therapy has been ongoing and patient limited by fatigue, L-BKA and balance deficits affecting ADLs and mobility. CIR recommended due to functional decline.    Review of Systems  Constitutional: Negative for chills and fever.  HENT: Negative for hearing loss and tinnitus.   Eyes: Negative for blurred vision and double vision.  Respiratory: Negative for cough and shortness of breath.   Cardiovascular: Negative for chest pain and palpitations.  Gastrointestinal: Negative for constipation, heartburn and nausea.  Musculoskeletal: Negative for falls.       Balance problems since dialysis   Skin: Positive for itching. Negative for rash.  Neurological: Positive for weakness. Negative for dizziness, sensory change and headaches.  Psychiatric/Behavioral: The patient does not have insomnia.      Past Medical History:  Diagnosis Date  . Anemia   . Arthritis   . Asthma    in the past  . CHF (congestive heart failure) ([place])   . Chronic kidney disease    Renal failure, dialysis on M/W/F  . 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 End stage renal disease ([place])   . End stage renal disease on dialysis ([place])   . Hemodialysis access, arteriovenous graft ([place])   .  Hyperlipidemia   . Hypertension   . Neuromuscular disorder [facility])     Past Surgical History:  Procedure Laterality Date  .  LEFT BELOW KNEE AMPUTATION (Left Knee)  06/08/2020  . A/V FISTULAGRAM Right 02/04/2017   Procedure: A/V Fistulagram;  Surgeon: Waynetta Sandy, MD;  Location: [place] [facility];  Service: Cardiovascular;  Laterality: Right;  . ABDOMINAL AORTOGRAM W/LOWER EXTREMITY N/A 11/18/2019   Procedure: ABDOMINAL AORTOGRAM W/LOWER EXTREMITY;  Surgeon: [name], MD;  Location: [place] [facility];  Service: Cardiovascular;  Laterality: N/A;  . ABDOMINAL AORTOGRAM W/LOWER EXTREMITY N/A 02/23/2020   Procedure: ABDOMINAL AORTOGRAM W/LOWER EXTREMITY;  Surgeon: [name], MD;  Location: [place] [facility];  Service: Cardiovascular;  Laterality: N/A;  Bilateral  . AMPUTATION Left 06/08/2020   Procedure: LEFT BELOW KNEE AMPUTATION;  Surgeon: Cherre Robins, MD;  Location: [place];  Service: Vascular;  Laterality: Left;  . AV FISTULA PLACEMENT Right 08/25/2014   Procedure: RIGHT BRACHIOCEPHALIC ARTERIOVENOUS (AV) FISTULA CREATION;  Surgeon: Mal Misty, MD;  Location: [place];  Service: Vascular;  Laterality: Right;  . AV FISTULA PLACEMENT Right 03/18/2019   Procedure: INSERTION OF ARTERIOVENOUS (AV) GORE-TEX GRAFT RIGHT ARM;  Surgeon: Serafina Mitchell, MD;  Location: [place];  Service: Vascular;  Laterality: Right;  . BASCILIC VEIN TRANSPOSITION Right 12/24/2018   Procedure: FIRST STAGE BASILIC VEIN TRANSPOSITION RIGHT UPPER ARM;  Surgeon: Serafina Mitchell, MD;  Location: [place];  Service: Vascular;  Laterality: Right;  . FISTULA SUPERFICIALIZATION Right 11/03/2014  Procedure: RIGHT UPPER ARM FISTULA SUPERFICIALIZATION;  Surgeon: Mal Misty, MD;  Location: [place];  Service: Vascular;  Laterality: Right;  . FISTULOGRAM Right 01/16/2016   Procedure: RIGHT UPPER EXTREMITY ARTERIOVENOUS FISTULOGRAM WITH BALLOON ANGIOPLASTY;  Surgeon: Vickie Epley, MD;  Location:  [facility];  Service: Vascular;  Laterality: Right;  . INSERTION OF DIALYSIS CATHETER     X4  . IR FLUORO GUIDE CV LINE RIGHT  12/03/2018  . IR REMOVAL TUN CV CATH W/O FL  07/06/2019  . IR THROMBECTOMY AV FISTULA W/THROMBOLYSIS/PTA INC/SHUNT/IMG RIGHT Right 12/03/2018  . IR THROMBECTOMY AV FISTULA W/THROMBOLYSIS/PTA/STENT INC/SHUNT/IMG RT Right 10/11/2019  . IR US GUIDE VASC ACCESS RIGHT  12/03/2018  . IR US GUIDE VASC ACCESS RIGHT  12/03/2018  . IR US GUIDE VASC ACCESS RIGHT  10/14/2019  . LIGATION OF COMPETING BRANCHES OF ARTERIOVENOUS FISTULA Right 11/03/2014   Procedure: LIGATION OF COMPETING BRANCHE x1 OF RIGHT UPPER ARM ARTERIOVENOUS FISTULA;  Surgeon: Mal Misty, MD;  Location: [place];  Service: Vascular;  Laterality: Right;  . PERIPHERAL VASCULAR BALLOON ANGIOPLASTY Right 02/04/2017   Procedure: PERIPHERAL VASCULAR BALLOON ANGIOPLASTY;  Surgeon: Waynetta Sandy, MD;  Location: [place] [facility];  Service: Cardiovascular;  Laterality: Right;  . PERIPHERAL VASCULAR BALLOON ANGIOPLASTY Right 11/18/2019   Procedure: PERIPHERAL VASCULAR BALLOON ANGIOPLASTY;  Surgeon: [name], MD;  Location: [place] [facility];  Service: Cardiovascular;  Laterality: Right;  anterior tibial  . PERIPHERAL VASCULAR BALLOON ANGIOPLASTY  02/23/2020   Procedure: PERIPHERAL VASCULAR BALLOON ANGIOPLASTY;  Surgeon: [name], MD;  Location: [place] [facility];  Service: Cardiovascular;;  Lt  AT  . TRANSMETATARSAL AMPUTATION Left 02/24/2020   Procedure: TRANSMETATARSAL AMPUTATION LEFT;  Surgeon: Cherre Robins, MD;  Location: [place];  Service: Vascular;  Laterality: Left;  . TUBAL LIGATION      Family History  Problem Relation Age of Onset  . Diabetes Mother   . Hypertension Mother   . Cancer Father   . Diabetes Father   . Hypertension Sister   . Diabetes Daughter   . Heart disease Daughter   . Hypertension Daughter   . Diabetes Son   . Heart disease Son   . Hypertension Son   .  Peripheral vascular disease Son        amputation    Social History:  Widowed. Lives with family and was independent with walker PTA. She reports that she quit smoking about 35 years ago. Her smoking use included cigarettes. She has never used smokeless tobacco. She reports that she does not drink alcohol and does not use drugs.    Allergies  Allergen Reactions  . Sulfa Antibiotics Swelling    Facility-Administered Medications Prior to Admission  Medication Dose Route Frequency Provider Last Rate Last Admin  . sodium chloride flush (NS) 0.9 % injection 3 mL  3 mL Intravenous Q12H [name], MD      . sodium chloride flush (NS) 0.9 % injection 3 mL  3 mL Intravenous PRN [name], MD       Medications Prior to Admission  Medication Sig Dispense Refill  . acetaminophen (TYLENOL) 325 MG tablet Take 2 tablets (650 mg total) by mouth every 6 (six) hours as needed for mild pain (or Fever >/= 101). 12 tablet 0  . amLODipine (NORVASC) 10 MG tablet Take 1 tablet (10 mg total) by mouth at bedtime. 30 tablet 0  . aspirin EC 81 MG tablet Take 81 mg  by mouth at bedtime. Swallow whole.    Marland Kitchen atorvastatin (LIPITOR) 40 MG tablet Take 1 tablet (40 mg total) by mouth daily. (Patient taking differently: Take 40 mg by mouth at bedtime.) 90 tablet 3  . clopidogrel (PLAVIX) 75 MG tablet Take 1 tablet (75 mg total) by mouth daily. 30 tablet 11  . diphenhydrAMINE (BENADRYL) 25 MG tablet Take 25 mg by mouth every 6 (six) hours as needed for itching.    . Dulaglutide (TRULICITY) 1.5 0000000 SOPN Inject 1.5 mg into the skin once a week. Saturday    . Insulin Glargine (TOUJEO MAX SOLOSTAR [place]) Inject 100 Units into the skin daily.    . metoprolol succinate (TOPROL-XL) 100 MG 24 hr tablet Take 1 tablet (100 mg total) by mouth daily. TAKE ONE TABLET BY MOUTH DAILY. TAKE WITH OR IMMEDIATELY FOLLOWING A MEAL. (Patient taking differently: Take 100 mg by mouth daily with supper. TAKE WITH OR  IMMEDIATELY FOLLOWING A MEAL.) 90 tablet 1  . oxyCODONE-acetaminophen (PERCOCET) 5-325 MG tablet Take 1 tablet by mouth every 8 (eight) hours as needed for severe pain. 20 tablet 0  . sevelamer carbonate (RENVELA) 800 MG tablet Take 1,600-2,400 mg by mouth See admin instructions. Take 2400 mg with each meal and 1600 mg with each snack    . Insulin Degludec (TRESIBA) 100 UNIT/ML SOLN Inject 100 Units into the skin daily. (Patient not taking: No sig reported) 30 mL 5  . multivitamin (RENA-VIT) TABS tablet Take 1 tablet by mouth at bedtime.     Marland Kitchen oxyCODONE-acetaminophen (PERCOCET) 5-325 MG tablet Take 1 tablet by mouth every 8 (eight) hours as needed for up to 10 doses for severe pain. 10 tablet 0    Drug Regimen Review  Drug regimen was reviewed and remains appropriate with no significant issues identified  Home: Home Living Family/patient expects to be discharged to:: Private residence Living Arrangements: Children,Other relatives (2 grandsons) Available Help at Discharge: Family Type of Home: House Home Access: Ramped entrance Home Layout: Two level,Able to live on main level with bedroom/bathroom Bathroom Shower/Tub: Tub/shower unit (pt takes sponge baths) Bathroom Toilet: Standard (pt uses bedside commode) Bathroom Accessibility: Yes Home Equipment: Walker - 2 wheels,Tub bench,Hand held shower head,Wheelchair - manual  Lives With: Daughter   Functional History: Prior Function Level of Independence: Needs assistance Gait / Transfers Assistance Needed: uses w/c for primary mobility ADL's / Homemaking Assistance Needed: Pt needs help with bra and socks.  Sponge bathes upper body on her own but daughter does the lower body.  Pt does light ADLs only, pt does some cooking but daughter does most, Can manage meds. Does not go inot bathroom Comments: Pt wants grab bars in shower  Functional Status:  Mobility: Bed Mobility Overal bed mobility: Needs Assistance Bed Mobility: Supine to  Sit Supine to sit: Supervision,HOB elevated Sit to supine: Min guard General bed mobility comments: min guard for safety with HOB elevated Transfers Overall transfer level: Needs assistance Equipment used: Rolling walker (2 wheeled) Transfers: Sit to/from [employer] Sit to Stand: Min assist Stand pivot transfers: Min assist General transfer comment: sit to stand x 3 with minA to power up. Stand pivot to recliner with minA, patient unable to hop this session but able to pivot R foot towards bed Ambulation/Gait Ambulation/Gait assistance: Min guard Gait Distance (Feet): 2 Feet Assistive device: Rolling walker (2 wheeled) Gait Pattern/deviations: Step-to pattern (hop to pattern.) General Gait Details: pivoting foot towards HOB for repositioning, unable to hop on R LE  this session    ADL: ADL Overall ADL's : Needs assistance/impaired Eating/Feeding: Set up,Independent,Sitting Grooming: Wash/dry hands,Wash/dry face,Sitting,Supervision/safety,Set up Upper Body Bathing: Supervision/ safety,Set up,Sitting Upper Body Bathing Details (indicate cue type and reason): simulated seated in recliner Lower Body Bathing: Sitting/lateral leans,Maximal assistance Lower Body Bathing Details (indicate cue type and reason): simulated via LB dressing from EOB Upper Body Dressing : Supervision/safety,Set up,Sitting Upper Body Dressing Details (indicate cue type and reason): donned clean gown Lower Body Dressing: Maximal assistance,Sitting/lateral leans Lower Body Dressing Details (indicate cue type and reason): to reposition KI from EOB Toilet Transfer: Moderate assistance,RW Toilet Transfer Details (indicate cue type and reason): unable to pivot but MOD A to rise into standing Toileting- Clothing Manipulation and Hygiene: Minimal assistance,Sitting/lateral lean Functional mobility during ADLs: Moderate assistance,Rolling walker (sit<>stand only) General ADL Comments: pt limited by  decreased activity tolerance, impaired balance and generlized weakness  Cognition: Cognition Overall Cognitive Status: Within Functional Limits for tasks assessed Orientation Level: Oriented X4 Cognition Arousal/Alertness: Awake/alert Behavior During Therapy: WFL for tasks assessed/performed Overall Cognitive Status: Within Functional Limits for tasks assessed   Blood pressure (!) 127/54, pulse 66, temperature 98.5 F (36.9 C), temperature source Oral, resp. rate 17, height '5\' 4"'$  (1.626 m), weight 103.1 kg, SpO2 97 %. Physical Exam Vitals and nursing note reviewed.  Constitutional:      General: She is not in acute distress.    Appearance: She is obese.  HENT:     Head: Normocephalic and atraumatic.     Right Ear: External ear normal.     Left Ear: External ear normal.     Mouth/Throat:     Mouth: Mucous membranes are moist.     Pharynx: No oropharyngeal exudate.     Comments: Missing numerous teeth Eyes:     Pupils: Pupils are equal, round, and reactive to light.  Cardiovascular:     Rate and Rhythm: Normal rate and regular rhythm.     Heart sounds: No murmur heard. No gallop.   Pulmonary:     Effort: Pulmonary effort is normal. No respiratory distress.     Breath sounds: No wheezing or rales.  Abdominal:     General: Bowel sounds are normal. There is no distension.     Palpations: Abdomen is soft.     Tenderness: There is no abdominal tenderness.  Musculoskeletal:     Cervical back: Normal range of motion.     Comments: L-BKA incision with edema, min serosanguinous drainage on dressing and sutures intact.  Right 2nd toe-- with dry gangrene and in process of being auto-amputated medially.    Skin:    General: Skin is warm.     Comments: Chronic changes in RLE, right second toe gangrenous, proximal portion of toe remains intact. Skin very dry in foot/leg.  Neurological:     Mental Status: She is alert and oriented to person, place, and time.     Comments: Alert and  oriented x 3. Normal insight and awareness. Intact Memory. Normal language and speech. Cranial nerve exam unremarkable. UE motor 4+ to 5/5. RLE: 3+/5 HF, 4/5 KE and 3-4/5 ADF/PF. LLE: able to lift leg off bed about 6". Decreased LT RLE distally.  Psychiatric:        Mood and Affect: Mood normal.        Behavior: Behavior normal.     Results for orders placed or performed during the hospital encounter of 06/08/20 (from the past 48 hour(s))  Glucose, capillary     Status:  Abnormal   Collection Time: 06/14/20 11:57 AM  Result Value Ref Range   Glucose-Capillary 115 (H) 70 - 99 mg/dL    Comment: Glucose reference range applies only to samples taken after fasting for at least 8 hours.  Glucose, capillary     Status: Abnormal   Collection Time: 06/14/20  4:33 PM  Result Value Ref Range   Glucose-Capillary 189 (H) 70 - 99 mg/dL    Comment: Glucose reference range applies only to samples taken after fasting for at least 8 hours.  Glucose, capillary     Status: Abnormal   Collection Time: 06/14/20  8:05 PM  Result Value Ref Range   Glucose-Capillary 103 (H) 70 - 99 mg/dL    Comment: Glucose reference range applies only to samples taken after fasting for at least 8 hours.  Renal function panel     Status: Abnormal   Collection Time: 06/15/20  2:01 AM  Result Value Ref Range   Sodium 133 (L) 135 - 145 mmol/L   Potassium 4.7 3.5 - 5.1 mmol/L   Chloride 95 (L) 98 - 111 mmol/L   CO2 26 22 - 32 mmol/L   Glucose, Bld 123 (H) 70 - 99 mg/dL    Comment: Glucose reference range applies only to samples taken after fasting for at least 8 hours.   BUN 42 (H) 8 - 23 mg/dL   Creatinine, Ser 7.46 (H) 0.44 - 1.00 mg/dL   Calcium 8.3 (L) 8.9 - 10.3 mg/dL   Phosphorus 5.5 (H) 2.5 - 4.6 mg/dL   Albumin 2.1 (L) 3.5 - 5.0 g/dL   GFR, Estimated 6 (L) >60 mL/min    Comment: (NOTE) Calculated using the CKD-EPI Creatinine Equation (2021)    Anion gap 12 5 - 15    Comment: Performed at [facility] [address], [place], [place] [address]  CBC     Status: Abnormal   Collection Time: 06/15/20  2:01 AM  Result Value Ref Range   WBC 14.7 (H) 4.0 - 10.5 K/uL   RBC 3.26 (L) 3.87 - 5.11 MIL/uL   Hemoglobin 8.3 (L) 12.0 - 15.0 g/dL   HCT 26.9 (L) 36.0 - 46.0 %   MCV 82.5 80.0 - 100.0 fL   MCH 25.5 (L) 26.0 - 34.0 pg   MCHC 30.9 30.0 - 36.0 g/dL   RDW 17.3 (H) 11.5 - 15.5 %   Platelets 600 (H) 150 - 400 K/uL   nRBC 0.0 0.0 - 0.2 %    Comment: Performed at [facility] [address] [place], [place] [address]  Glucose, capillary     Status: Abnormal   Collection Time: 06/15/20  6:28 AM  Result Value Ref Range   Glucose-Capillary 112 (H) 70 - 99 mg/dL    Comment: Glucose reference range applies only to samples taken after fasting for at least 8 hours.  Glucose, capillary     Status: Abnormal   Collection Time: 06/15/20 11:54 AM  Result Value Ref Range   Glucose-Capillary 121 (H) 70 - 99 mg/dL    Comment: Glucose reference range applies only to samples taken after fasting for at least 8 hours.  Glucose, capillary     Status: Abnormal   Collection Time: 06/15/20  4:14 PM  Result Value Ref Range   Glucose-Capillary 189 (H) 70 - 99 mg/dL    Comment: Glucose reference range applies only to samples taken after fasting for at least 8 hours.  Glucose, capillary  Status: Abnormal   Collection Time: 06/15/20  7:39 PM  Result Value Ref Range   Glucose-Capillary 137 (H) 70 - 99 mg/dL    Comment: Glucose reference range applies only to samples taken after fasting for at least 8 hours.  Renal function panel     Status: Abnormal   Collection Time: 06/16/20  1:33 AM  Result Value Ref Range   Sodium 130 (L) 135 - 145 mmol/L   Potassium 4.9 3.5 - 5.1 mmol/L   Chloride 92 (L) 98 - 111 mmol/L   CO2 24 22 - 32 mmol/L   Glucose, Bld 137 (H) 70 - 99 mg/dL    Comment: Glucose reference range applies only to samples taken after fasting for at least 8 hours.   BUN 60 (H) 8 - 23 mg/dL    Creatinine, Ser 9.37 (H) 0.44 - 1.00 mg/dL   Calcium 8.2 (L) 8.9 - 10.3 mg/dL   Phosphorus 6.2 (H) 2.5 - 4.6 mg/dL   Albumin 2.2 (L) 3.5 - 5.0 g/dL   GFR, Estimated 4 (L) >60 mL/min    Comment: (NOTE) Calculated using the CKD-EPI Creatinine Equation (2021)    Anion gap 14 5 - 15    Comment: Performed at [facility] [address] [place], [place] [address]  Glucose, capillary     Status: Abnormal   Collection Time: 06/16/20  9:17 AM  Result Value Ref Range   Glucose-Capillary 116 (H) 70 - 99 mg/dL    Comment: Glucose reference range applies only to samples taken after fasting for at least 8 hours.   No results found.     Medical Problem List and Plan: 1.  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   -patient may shower if residual limb is water-proofed  -ELOS/Goals: 7-10 days, Mod I at w/c level 2.  Antithrombotics: -DVT/anticoagulation:  Pharmaceutical: Heparin  -antiplatelet therapy: ASA/Plavix 3. Pain Management:  Oxycodone prn--has not used any for 48 hours.   4. Mood: LCSW to follow for evaluation and support.   -antipsychotic agents: N/A 5. Neuropsych: This patient is capable of making decisions on her own behalf. 6. Skin/Wound Care: Protein supplement for hypoalbuminemia    --monitor wound for healing.   -apply dry dressing and ACE to residual limb  -apply small amount of gauze to protect gangrenous second toe which is about to auto-amputate  7. Fluids/Electrolytes/Nutrition: Strict I/O. Labs with HD. 1200cc/FR. 8. ESRD: Schedule HD at the end of the day on MWF to help with tolerance of therapy.   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  9. T2DM with nephropathy: Hgb A1c- 6.4 on last check. Was on Lantus [id number] units with Trulicity 1.5 mg/wk.   --Monitor BS ac/hs and use SSI for elevated BS  --Continue to titrate Lantus to home dose as indicated .  10 HTN: Monitor BP tid. Has  been labile  --Continue Metoprolol and amlodipine. 11. Leucocytosis: Monitor for fevers and other signs of infection. 12. PVD: Continue Plavix, ASA and Lipitor 13. Morbid obesity: BMI 39.01.  --educate on diet as well as importance of weight loss to help promote health and mobility.  14. Anemia of chronic disease: Being managed with weekly Aranesp.  15. Thrombocytosis: Continue to trend--improved form peak of 774-->600       Bary Leriche, PA-C 06/16/2020

## 2020-06-16 NOTE — Progress Notes (Signed)
Inpatient Rehabilitation Admissions Coordinator  CIR bed is available to admit patient to today. I met with patient at bedside , contacted Dr. Tyrell Antonio, acute team and TOC. I will make the arrangements to admit today. Otila Kluver, in hemodialysis, was contacted and made aware.  Danne Baxter, RN, MSN Rehab Admissions Coordinator [phone] 06/16/2020 10:29 AM

## 2020-06-16 NOTE — Progress Notes (Signed)
[facility] Progress Note   Subjective:    Feels well this am. No complaints. For dialysis today.    Objective Vitals:   06/15/20 1936 06/16/20 0500 06/16/20 0515 06/16/20 0515  BP: (!) 153/62  (!) 144/54 (!) 144/54  Pulse: 82  76 76  Resp: '18  18 18  '$ Temp: 97.9 F (36.6 C)  98.5 F (36.9 C) 98.5 F (36.9 C)  TempSrc: Oral  Oral Oral  SpO2: 100%  95% 95%  Weight:  103.1 kg    Height:       Physical Exam General:Well developed female in NAD Heart:RRR, no mrg Lungs:CTAB, nml WOB Abdomen:soft, NTND Extremities:L BKA, no edema b/l Dialysis Access: RU AVG +b   Filed Weights   06/14/20 0954 06/15/20 0500 06/16/20 0500  Weight: 100.9 kg 102.4 kg 103.1 kg   No intake or output data in the 24 hours ending 06/16/20 0838  Additional Objective Labs: Basic Metabolic Panel: Recent Labs  Lab 06/14/20 0451 06/15/20 0201 06/16/20 0133  NA 131* 133* 130*  K 4.9 4.7 4.9  CL 95* 95* 92*  CO2 '23 26 24  '$ GLUCOSE 101* 123* 137*  BUN 50* 42* 60*  CREATININE 8.63* 7.46* 9.37*  CALCIUM 8.1* 8.3* 8.2*  PHOS 6.1* 5.5* 6.2*   Liver Function Tests: Recent Labs  Lab 06/14/20 0451 06/15/20 0201 06/16/20 0133  ALBUMIN 2.0* 2.1* 2.2*   CBC: Recent Labs  Lab 06/10/20 1212 06/11/20 0239 06/12/20 0405 06/13/20 0305 06/14/20 0451 06/15/20 0201  WBC 24.2* 18.4* 16.1* 15.5* 15.3* 14.7*  NEUTROABS 20.6*  --   --   --   --   --   HGB 9.0* 8.7* 8.8* 8.1* 8.1* 8.3*  HCT 29.8* 28.5* 27.7* 26.1* 26.9* 26.9*  MCV 82.1 81.7 81.5 80.3 82.5 82.5  PLT 675* 619* 654* 598* 563* 600*   CBG: Recent Labs  Lab 06/14/20 2005 06/15/20 0628 06/15/20 1154 06/15/20 1614 06/15/20 1939  GLUCAP 103* 112* 121* 189* 137*    Medications:  . (feeding supplement) PROSource Plus  30 mL Oral BID BM  . amLODipine  10 mg Oral QHS  . aspirin EC  81 mg Oral QHS  . atorvastatin  40 mg Oral QHS  . Chlorhexidine Gluconate Cloth  6 each Topical Q0600  . Chlorhexidine Gluconate Cloth   6 each Topical Q0600  . clopidogrel  75 mg Oral Daily  . darbepoetin (ARANESP) injection - DIALYSIS  60 mcg Intravenous Q Fri-HD  . doxercalciferol  1.5 mcg Intravenous Q M,W,F-HD  . heparin  5,000 Units Subcutaneous Q8H  . insulin aspart  0-15 Units Subcutaneous TID WC  . insulin glargine  20 Units Subcutaneous Daily  . metoprolol succinate  50 mg Oral Daily  . multivitamin  1 tablet Oral QHS  . polyethylene glycol  17 g Oral Daily  . senna-docusate  1 tablet Oral BID  . sevelamer carbonate  2,400 mg Oral TID WC  . sodium chloride flush  3 mL Intravenous Q12H    Dialysis Orders: [facility] MWF 4hrs 2K 2.5Ca EDW 101kg RU AVG Heparin 1000 load and 1000 units qhr EPO 14000 units qHD Venofer '100mg'$  qHD Hectorol 1.44mg qHD    Assessment/Plan: 1. 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 Waiting for bed placement in CIR.  2. Leukocytosis - improving, WBC 14.7 BC negative.  S/p pre OP ancef. 3. ESRD- on MWF. tolerating dialysis well.HD today.  4. HTN/ volume -BP remains mildly elevated. Continue amlodipine and  metoprolol.Does not appear grossly volume overloaded.  Per weights remains over EDW.  UF 3-3.5L with HD today  5. DM2 - peradmitting team. 6. Anemia ckd -Hgb 8.3. Aranesp 60 ordered with HD 2/18.  7. MBD ckd -Calcium and phos in goal. Continue renvela and VDRA.  8. Nutrition - Renal diet with fluid restrictions. Protein supplements, Vitamins.   Lynnda Child PA-C [facility] 06/16/2020,8:39 AM

## 2020-06-16 NOTE — Progress Notes (Signed)
Pt admitted to room 4M07. Denies pain or discomfort at this time. Oriented to floor and rehab policy. Pt currently resting in bed with all needs within reach.   Gerald Stabs, RN

## 2020-06-16 NOTE — Discharge Summary (Signed)
Physician Discharge Summary  Christy Zhang [id number] DOB: 1956/01/12 DOA: 06/08/2020  PCP: Claretta Fraise, MD  Admit date: 06/08/2020 Discharge date: 06/16/2020  Admitted From: Home  Disposition:  CIR  Recommendations for Outpatient Follow-up:  1. Follow up with PCP in 1-2 weeks 2. Please obtain BMP/CBC in one week 3.   Discharge Condition: Stable.  CODE STATUS: Full code Diet recommendation: Heart Healthy  Brief/Interim Summary: Christy Zhang a 65 y.o.femalewith medical history significant ofESRD on HD, IDDM, HTN, HLD, PVD, and gangrene requiring prior amputation presented for nonhealing left transmetatarsal amputation.  Patient had been hospitalizedin10/2021 after being found to have gangrene of her left toes. She underwent angioplasty of the left anterior tibial artery on 02/23/2020, but subsequently underwent transmetatarsal amputation of the left the following day. Patient had home health coming out to do dressing changes twice a week. However, when the patient followed back up with vascular surgery it was noted that the wound was not healing appropriately and it was recommended that she have a below-knee amputation. Surgery had initially been scheduled for 04/11/2020, but patient wanted to have a second opinion with the wound care doctor. After being evaluated by the wound care specialist patient was advised that wound will likely not heal. Surgery was postponed after patient developed Covid on 05/02/2020, and then subsequently on 05/16/2020 due to inclimentweather.   On 06/08/2020 the  patient was admitted and underwent below-knee amputation of the left leg with Dr. Garrison Columbus less than 50 mL of blood loss..Labs prior to the procedure were significant for WBC 17.1, hemoglobin 9.4, platelets 646, BUN 17, creatinine 6.21,and albumin 2.5.  Cultures remain negative.  Leukocytosis improving.  Nephrology was consulted.  She was getting hemodialysis during  hospitalization.  Seen by PT OT who recommended CIR. Patient is stable to be transfer to CIR>   Gangrene of the left forefoot POD #7 Left BKA -Stable no distress. -  nonhealing transmetatarsal amputation of the left footwith gangrene. - Left below-knee amputation performed by Dr. Stanford Breed of vascular  - S/Pleft below-knee amputation: 06/08/2020.  Doing well.  No complaints. -Continue with pain management, oxycodone,.  -Postop care per vascular -Plan for CIR admission today.   Leukocytosis:  -Monitoring, improving -No signs of sepsis afebrile normotensive - WBC elevated at 17.1 >> 22.2  >>> 18.4> 16.1> 15.5> 15.3 -Following blood cultures >>> no growth to date -monitor WBC.  ESRD on HD: -Patient normally dialyzes Monday, Wednesday, and Friday.  Dialysis per nephrology.  Appreciate help.  Essential hypertension:  Home blood pressure medications include metoprolol succinate 100 mg daily and amlodipine 10 mg nightly.  Reportedly had some issues with the bradycardia so Toprol-XL was discontinued.  Later it was resumed back at lower dose of 50 mg p.o. daily.  She also remains on amlodipine.  Blood pressure better controlled.  Anemia of chronic disease -with chronic kidney disease  Hemoglobin is stable.  Diabetes mellitus type II,  -Check an A1c 5.8, last A1c 6.4 12/29/2019 -  Home regimen includes glargine 100 units daily and Trulicity 1.5 mg weekly. -resume  -continue with lantus in CIR. Resume trulicity. Hold trujeo./   PVD -Continue with aspirin, plavix and statins.  -Stable, vascular following.   Hyperlipidemia -Continue home atorvastatin 40 mg nightly  Hypoalbuminemia: Acute.  Albumin noted 2.5, 2.2 -  Morbid obesity BMI 40.34 kg/m -needs life style changes.  -Dietary education  ------------------------------------------------------------------------------------------------------------------------------------ Nutritional status:  The patient's BMI  is: Body mass index is 39.01 kg/m. I agree with the assessment  and plan as outlined below:    Discharge Diagnoses:  Principal Problem:   S/P BKA (below knee amputation) unilateral (HCC) Active Problems:   ESRD (end stage renal disease) ([place])   Controlled type 2 diabetes mellitus with chronic kidney disease on chronic dialysis, with long-term current use of insulin (HCC)   Essential (primary) hypertension   Obesity, Class III, BMI 40-49.9 (morbid obesity) ([place])   Gangrene (HCC)    Discharge Instructions  Discharge Instructions    Diet - low sodium heart healthy   Complete by: As directed    Discharge wound care:   Complete by: As directed    See above   Increase activity slowly   Complete by: As directed      Allergies as of 06/16/2020      Reactions   Sulfa Antibiotics Swelling      Medication List    STOP taking these medications   TOUJEO MAX SOLOSTAR [place] Replaced by: insulin glargine 100 UNIT/ML injection   Tresiba 100 UNIT/ML Soln Generic drug: Insulin Degludec     TAKE these medications   acetaminophen 325 MG tablet Commonly known as: TYLENOL Take 2 tablets (650 mg total) by mouth every 6 (six) hours as needed for mild pain (or Fever >/= 101).   amLODipine 10 MG tablet Commonly known as: NORVASC Take 1 tablet (10 mg total) by mouth at bedtime.   aspirin EC 81 MG tablet Take 81 mg by mouth at bedtime. Swallow whole.   atorvastatin 40 MG tablet Commonly known as: LIPITOR Take 1 tablet (40 mg total) by mouth daily. What changed: when to take this   clopidogrel 75 MG tablet Commonly known as: Plavix Take 1 tablet (75 mg total) by mouth daily.   diphenhydrAMINE 25 MG tablet Commonly known as: BENADRYL Take 25 mg by mouth every 6 (six) hours as needed for itching.   insulin glargine 100 UNIT/ML injection Commonly known as: LANTUS Inject 0.2 mLs (20 Units total) into the skin daily. Start taking on: June 17, 2020 Replaces: TOUJEO MAX SOLOSTAR  [place]   metoprolol succinate 100 MG 24 hr tablet Commonly known as: TOPROL-XL Take 1 tablet (100 mg total) by mouth daily. TAKE ONE TABLET BY MOUTH DAILY. TAKE WITH OR IMMEDIATELY FOLLOWING A MEAL. What changed:   when to take this  additional instructions   multivitamin Tabs tablet Take 1 tablet by mouth at bedtime.   oxyCODONE-acetaminophen 5-325 MG tablet Commonly known as: Percocet Take 1 tablet by mouth every 8 (eight) hours as needed for up to 10 doses for severe pain. What changed: Another medication with the same name was removed. Continue taking this medication, and follow the directions you see here.   polyethylene glycol 17 g packet Commonly known as: MIRALAX / GLYCOLAX Take 17 g by mouth daily. Start taking on: June 17, 2020   senna-docusate 8.6-50 MG tablet Commonly known as: Senokot-S Take 1 tablet by mouth 2 (two) times daily.   sevelamer carbonate 800 MG tablet Commonly known as: RENVELA Take 1,600-2,400 mg by mouth See admin instructions. Take 2400 mg with each meal and 1600 mg with each snack   Trulicity 1.5 0000000 Sopn Generic drug: Dulaglutide Inject 1.5 mg into the skin once a week. Saturday            Discharge Care Instructions  (From admission, onward)         Start     Ordered   06/16/20 0000  Discharge wound care:  Comments: See above   06/16/20 1041          Allergies  Allergen Reactions  . Sulfa Antibiotics Swelling    Consultations:  Nephrology   Vascular.    Procedures/Studies: No results found.   Subjective: She is feeling well. No new complaints. She was able to have ;arge BM yesterday  Discharge Exam: Vitals:   06/16/20 0515 06/16/20 0900  BP: (!) 144/54 (!) 127/54  Pulse: 76 66  Resp: 18 17  Temp: 98.5 F (36.9 C) 98.5 F (36.9 C)  SpO2: 95% 97%     General: Pt is alert, awake, not in acute distress Cardiovascular: RRR, S1/S2 +, no rubs, no gallops Respiratory: CTA bilaterally, no  wheezing, no rhonchi Abdominal: Soft, NT, ND, bowel sounds + Extremities: no edema, no cyanosis    The results of significant diagnostics from this hospitalization (including imaging, microbiology, ancillary and laboratory) are listed below for reference.     Microbiology: Recent Results (from the past 240 hour(s))  Culture, blood (routine x 2)     Status: None   Collection Time: 06/09/20 11:21 AM   Specimen: BLOOD LEFT HAND  Result Value Ref Range Status   Specimen Description BLOOD LEFT HAND  Final   Special Requests   Final    BOTTLES DRAWN AEROBIC ONLY Blood Culture results may not be optimal due to an inadequate volume of blood received in culture bottles   Culture   Final    NO GROWTH 5 DAYS Performed at [facility], [place] [address], [place], [place] [address]    Report Status 06/14/2020 FINAL  Final  Culture, blood (routine x 2)     Status: None   Collection Time: 06/09/20 11:40 AM   Specimen: BLOOD LEFT HAND  Result Value Ref Range Status   Specimen Description BLOOD LEFT HAND  Final   Special Requests   Final    BOTTLES DRAWN AEROBIC ONLY Blood Culture results may not be optimal due to an inadequate volume of blood received in culture bottles   Culture   Final    NO GROWTH 5 DAYS Performed at [facility], [place] [address] [place], [place] [address]    Report Status 06/14/2020 FINAL  Final     Labs: BNP (last 3 results) No results for input(s): BNP in the last 8760 hours. Basic Metabolic Panel: Recent Labs  Lab 06/12/20 0405 06/13/20 0305 06/14/20 0451 06/15/20 0201 06/16/20 0133  NA 130* 130* 131* 133* 130*  K 4.8 5.4* 4.9 4.7 4.9  CL 91* 94* 95* 95* 92*  CO2 '25 22 23 26 24  '$ GLUCOSE 72 92 101* 123* 137*  BUN 56* 70* 50* 42* 60*  CREATININE 9.78* 11.29* 8.63* 7.46* 9.37*  CALCIUM 8.0* 7.9* 8.1* 8.3* 8.2*  PHOS 5.5* 6.1* 6.1* 5.5* 6.2*   Liver Function Tests: Recent Labs  Lab 06/12/20 0405 06/13/20 0305 06/14/20 0451 06/15/20 0201  06/16/20 0133  ALBUMIN 2.1* 2.0* 2.0* 2.1* 2.2*   No results for input(s): LIPASE, AMYLASE in the last 168 hours. No results for input(s): AMMONIA in the last 168 hours. CBC: Recent Labs  Lab 06/10/20 1212 06/11/20 0239 06/12/20 0405 06/13/20 0305 06/14/20 0451 06/15/20 0201  WBC 24.2* 18.4* 16.1* 15.5* 15.3* 14.7*  NEUTROABS 20.6*  --   --   --   --   --   HGB 9.0* 8.7* 8.8* 8.1* 8.1* 8.3*  HCT 29.8* 28.5* 27.7* 26.1* 26.9* 26.9*  MCV 82.1 81.7 81.5 80.3  82.5 82.5  PLT 675* 619* 654* 598* 563* 600*   Cardiac Enzymes: No results for input(s): CKTOTAL, CKMB, CKMBINDEX, TROPONINI in the last 168 hours. BNP: Invalid input(s): POCBNP CBG: Recent Labs  Lab 06/15/20 0628 06/15/20 1154 06/15/20 1614 06/15/20 1939 06/16/20 0917  GLUCAP 112* 121* 189* 137* 116*   D-Dimer No results for input(s): DDIMER in the last 72 hours. Hgb A1c No results for input(s): HGBA1C in the last 72 hours. Lipid Profile No results for input(s): CHOL, HDL, LDLCALC, TRIG, CHOLHDL, LDLDIRECT in the last 72 hours. Thyroid function studies No results for input(s): TSH, T4TOTAL, T3FREE, THYROIDAB in the last 72 hours.  Invalid input(s): FREET3 Anemia work up No results for input(s): VITAMINB12, FOLATE, FERRITIN, TIBC, IRON, RETICCTPCT in the last 72 hours. Urinalysis No results found for: COLORURINE, APPEARANCEUR, [place], [place], [place], [place], [place], [place], PROTEINUR, UROBILINOGEN, NITRITE, LEUKOCYTESUR Sepsis Labs Invalid input(s): PROCALCITONIN,  WBC,  LACTICIDVEN Microbiology Recent Results (from the past 240 hour(s))  Culture, blood (routine x 2)     Status: None   Collection Time: 06/09/20 11:21 AM   Specimen: BLOOD LEFT HAND  Result Value Ref Range Status   Specimen Description BLOOD LEFT HAND  Final   Special Requests   Final    BOTTLES DRAWN AEROBIC ONLY Blood Culture results may not be optimal due to an inadequate volume of blood received in culture bottles   Culture    Final    NO GROWTH 5 DAYS Performed at [facility], [place] [address] [place], [place] [address]    Report Status 06/14/2020 FINAL  Final  Culture, blood (routine x 2)     Status: None   Collection Time: 06/09/20 11:40 AM   Specimen: BLOOD LEFT HAND  Result Value Ref Range Status   Specimen Description BLOOD LEFT HAND  Final   Special Requests   Final    BOTTLES DRAWN AEROBIC ONLY Blood Culture results may not be optimal due to an inadequate volume of blood received in culture bottles   Culture   Final    NO GROWTH 5 DAYS Performed at [facility], [place] [address] [place], [place] [address]    Report Status 06/14/2020 FINAL  Final     Time coordinating discharge: 40 minutes  SIGNED:   Elmarie Shiley, MD  [facility]

## 2020-06-16 NOTE — H&P (Signed)
Physical Medicine and Rehabilitation Admission H&P     CC: Functional deficits due to L-BKA and weakness.      HPI:  [name]. Comi is a 65 year old female with history of ESRD-HD MWF, T2DM, HTN, morbid obesity--BMI 39, PVD with gangrenous changes left foot and multiple attempts at limb salvage. BKA was recommended in December but delayed to to request for second opinion, Covid infection and inclement weather. She was admitted on 06/08/20 for L-BKA by Dr. Stanford Breed. Post op course significant for LLE pain, leucocytosis with rise in WBC to 24.2--blood cultures X 2 negative. She was maintained on pre-op cefazolin thorough 02/13 and metoprolol resumed as bradycardia resolved. Nephrology following for management of HD MWF. Therapy has been ongoing and patient limited by fatigue, L-BKA and balance deficits affecting ADLs and mobility. CIR recommended due to functional decline.      Review of Systems  Constitutional: Negative for chills and fever.  HENT: Negative for hearing loss and tinnitus.   Eyes: Negative for blurred vision and double vision.  Respiratory: Negative for cough and shortness of breath.   Cardiovascular: Negative for chest pain and palpitations.  Gastrointestinal: Negative for constipation, heartburn and nausea.  Musculoskeletal: Negative for falls.       Balance problems since dialysis   Skin: Positive for itching. Negative for rash.  Neurological: Positive for weakness. Negative for dizziness, sensory change and headaches.  Psychiatric/Behavioral: The patient does not have insomnia.           Past Medical History:  Diagnosis Date  . Anemia    . Arthritis    . Asthma      in the past  . CHF (congestive heart failure) ([place])    . Chronic kidney disease      Renal failure, dialysis on M/W/F  . Diabetes mellitus with complication (HCC)      Type 2  . End stage renal disease ([place])    . End stage renal disease on dialysis ([place])    . Hemodialysis access,  arteriovenous graft ([place])    . Hyperlipidemia    . Hypertension    . Neuromuscular disorder [facility])             Past Surgical History:  Procedure Laterality Date  .  LEFT BELOW KNEE AMPUTATION (Left Knee)   06/08/2020  . A/V FISTULAGRAM Right 02/04/2017    Procedure: A/V Fistulagram;  Surgeon: Waynetta Sandy, MD;  Location: [place] [facility];  Service: Cardiovascular;  Laterality: Right;  . ABDOMINAL AORTOGRAM W/LOWER EXTREMITY N/A 11/18/2019    Procedure: ABDOMINAL AORTOGRAM W/LOWER EXTREMITY;  Surgeon: [name], MD;  Location: [place] [facility];  Service: Cardiovascular;  Laterality: N/A;  . ABDOMINAL AORTOGRAM W/LOWER EXTREMITY N/A 02/23/2020    Procedure: ABDOMINAL AORTOGRAM W/LOWER EXTREMITY;  Surgeon: [name], MD;  Location: [place] [facility];  Service: Cardiovascular;  Laterality: N/A;  Bilateral  . AMPUTATION Left 06/08/2020    Procedure: LEFT BELOW KNEE AMPUTATION;  Surgeon: Cherre Robins, MD;  Location: [place];  Service: Vascular;  Laterality: Left;  . AV FISTULA PLACEMENT Right 08/25/2014    Procedure: RIGHT BRACHIOCEPHALIC ARTERIOVENOUS (AV) FISTULA CREATION;  Surgeon: Mal Misty, MD;  Location: [place];  Service: Vascular;  Laterality: Right;  . AV FISTULA PLACEMENT Right 03/18/2019    Procedure: INSERTION OF ARTERIOVENOUS (AV) GORE-TEX GRAFT RIGHT ARM;  Surgeon: Serafina Mitchell, MD;  Location: [place];  Service: Vascular;  Laterality:  Right;  Marland Kitchen BASCILIC VEIN TRANSPOSITION Right 12/24/2018    Procedure: FIRST STAGE BASILIC VEIN TRANSPOSITION RIGHT UPPER ARM;  Surgeon: Serafina Mitchell, MD;  Location: [place];  Service: Vascular;  Laterality: Right;  . FISTULA SUPERFICIALIZATION Right 11/03/2014    Procedure: RIGHT UPPER ARM FISTULA SUPERFICIALIZATION;  Surgeon: Mal Misty, MD;  Location: [place];  Service: Vascular;  Laterality: Right;  . FISTULOGRAM Right 01/16/2016    Procedure: RIGHT UPPER EXTREMITY ARTERIOVENOUS FISTULOGRAM WITH BALLOON  ANGIOPLASTY;  Surgeon: Vickie Epley, MD;  Location: [facility];  Service: Vascular;  Laterality: Right;  . INSERTION OF DIALYSIS CATHETER        X4  . IR FLUORO GUIDE CV LINE RIGHT   12/03/2018  . IR REMOVAL TUN CV CATH W/O FL   07/06/2019  . IR THROMBECTOMY AV FISTULA W/THROMBOLYSIS/PTA INC/SHUNT/IMG RIGHT Right 12/03/2018  . IR THROMBECTOMY AV FISTULA W/THROMBOLYSIS/PTA/STENT INC/SHUNT/IMG RT Right 10/11/2019  . IR US GUIDE VASC ACCESS RIGHT   12/03/2018  . IR US GUIDE VASC ACCESS RIGHT   12/03/2018  . IR US GUIDE VASC ACCESS RIGHT   10/14/2019  . LIGATION OF COMPETING BRANCHES OF ARTERIOVENOUS FISTULA Right 11/03/2014    Procedure: LIGATION OF COMPETING BRANCHE x1 OF RIGHT UPPER ARM ARTERIOVENOUS FISTULA;  Surgeon: Mal Misty, MD;  Location: [place];  Service: Vascular;  Laterality: Right;  . PERIPHERAL VASCULAR BALLOON ANGIOPLASTY Right 02/04/2017    Procedure: PERIPHERAL VASCULAR BALLOON ANGIOPLASTY;  Surgeon: Waynetta Sandy, MD;  Location: [place] [facility];  Service: Cardiovascular;  Laterality: Right;  . PERIPHERAL VASCULAR BALLOON ANGIOPLASTY Right 11/18/2019    Procedure: PERIPHERAL VASCULAR BALLOON ANGIOPLASTY;  Surgeon: [name], MD;  Location: [place] [facility];  Service: Cardiovascular;  Laterality: Right;  anterior tibial  . PERIPHERAL VASCULAR BALLOON ANGIOPLASTY   02/23/2020    Procedure: PERIPHERAL VASCULAR BALLOON ANGIOPLASTY;  Surgeon: [name], MD;  Location: [place] [facility];  Service: Cardiovascular;;  Lt  AT  . TRANSMETATARSAL AMPUTATION Left 02/24/2020    Procedure: TRANSMETATARSAL AMPUTATION LEFT;  Surgeon: Cherre Robins, MD;  Location: [place];  Service: Vascular;  Laterality: Left;  . TUBAL LIGATION               Family History  Problem Relation Age of Onset  . Diabetes Mother    . Hypertension Mother    . Cancer Father    . Diabetes Father    . Hypertension Sister    . Diabetes Daughter    . Heart disease Daughter    .  Hypertension Daughter    . Diabetes Son    . Heart disease Son    . Hypertension Son    . Peripheral vascular disease Son          amputation      Social History:  Widowed. Lives with family and was independent with walker PTA. She reports that she quit smoking about 35 years ago. Her smoking use included cigarettes. She has never used smokeless tobacco. She reports that she does not drink alcohol and does not use drugs.      Allergies  Allergen Reactions  . Sulfa Antibiotics Swelling               Facility-Administered Medications Prior to Admission  Medication Dose Route Frequency Provider Last Rate Last Admin  . sodium chloride flush (NS) 0.9 % injection 3 mL  3 mL Intravenous Q12H [name], MD      .  sodium chloride flush (NS) 0.9 % injection 3 mL  3 mL Intravenous PRN [name], MD              Medications Prior to Admission  Medication Sig Dispense Refill  . acetaminophen (TYLENOL) 325 MG tablet Take 2 tablets (650 mg total) by mouth every 6 (six) hours as needed for mild pain (or Fever >/= 101). 12 tablet 0  . amLODipine (NORVASC) 10 MG tablet Take 1 tablet (10 mg total) by mouth at bedtime. 30 tablet 0  . aspirin EC 81 MG tablet Take 81 mg by mouth at bedtime. Swallow whole.      Marland Kitchen atorvastatin (LIPITOR) 40 MG tablet Take 1 tablet (40 mg total) by mouth daily. (Patient taking differently: Take 40 mg by mouth at bedtime.) 90 tablet 3  . clopidogrel (PLAVIX) 75 MG tablet Take 1 tablet (75 mg total) by mouth daily. 30 tablet 11  . diphenhydrAMINE (BENADRYL) 25 MG tablet Take 25 mg by mouth every 6 (six) hours as needed for itching.      . Dulaglutide (TRULICITY) 1.5 0000000 SOPN Inject 1.5 mg into the skin once a week. Saturday      . Insulin Glargine (TOUJEO MAX SOLOSTAR [place]) Inject 100 Units into the skin daily.      . metoprolol succinate (TOPROL-XL) 100 MG 24 hr tablet Take 1 tablet (100 mg total) by mouth daily. TAKE ONE TABLET BY MOUTH DAILY. TAKE  WITH OR IMMEDIATELY FOLLOWING A MEAL. (Patient taking differently: Take 100 mg by mouth daily with supper. TAKE WITH OR IMMEDIATELY FOLLOWING A MEAL.) 90 tablet 1  . oxyCODONE-acetaminophen (PERCOCET) 5-325 MG tablet Take 1 tablet by mouth every 8 (eight) hours as needed for severe pain. 20 tablet 0  . sevelamer carbonate (RENVELA) 800 MG tablet Take 1,600-2,400 mg by mouth See admin instructions. Take 2400 mg with each meal and 1600 mg with each snack      . Insulin Degludec (TRESIBA) 100 UNIT/ML SOLN Inject 100 Units into the skin daily. (Patient not taking: No sig reported) 30 mL 5  . multivitamin (RENA-VIT) TABS tablet Take 1 tablet by mouth at bedtime.       Marland Kitchen oxyCODONE-acetaminophen (PERCOCET) 5-325 MG tablet Take 1 tablet by mouth every 8 (eight) hours as needed for up to 10 doses for severe pain. 10 tablet 0      Drug Regimen Review  Drug regimen was reviewed and remains appropriate with no significant issues identified   Home: Home Living Family/patient expects to be discharged to:: Private residence Living Arrangements: Children,Other relatives (2 grandsons) Available Help at Discharge: Family Type of Home: House Home Access: Ramped entrance Home Layout: Two level,Able to live on main level with bedroom/bathroom Bathroom Shower/Tub: Tub/shower unit (pt takes sponge baths) Bathroom Toilet: Standard (pt uses bedside commode) Bathroom Accessibility: Yes Home Equipment: Walker - 2 wheels,Tub bench,Hand held shower head,Wheelchair - manual  Lives With: Daughter   Functional History: Prior Function Level of Independence: Needs assistance Gait / Transfers Assistance Needed: uses w/c for primary mobility ADL's / Homemaking Assistance Needed: Pt needs help with bra and socks.  Sponge bathes upper body on her own but daughter does the lower body.  Pt does light ADLs only, pt does some cooking but daughter does most, Can manage meds. Does not go inot bathroom Comments: Pt wants grab bars  in shower   Functional Status:  Mobility: Bed Mobility Overal bed mobility: Needs Assistance Bed Mobility: Supine to Sit Supine to sit:  Supervision,HOB elevated Sit to supine: Min guard General bed mobility comments: min guard for safety with HOB elevated Transfers Overall transfer level: Needs assistance Equipment used: Rolling walker (2 wheeled) Transfers: Sit to/from [employer] Sit to Stand: Min assist Stand pivot transfers: Min assist General transfer comment: sit to stand x 3 with minA to power up. Stand pivot to recliner with minA, patient unable to hop this session but able to pivot R foot towards bed Ambulation/Gait Ambulation/Gait assistance: Min guard Gait Distance (Feet): 2 Feet Assistive device: Rolling walker (2 wheeled) Gait Pattern/deviations: Step-to pattern (hop to pattern.) General Gait Details: pivoting foot towards HOB for repositioning, unable to hop on R LE this session   ADL: ADL Overall ADL's : Needs assistance/impaired Eating/Feeding: Set up,Independent,Sitting Grooming: Wash/dry hands,Wash/dry face,Sitting,Supervision/safety,Set up Upper Body Bathing: Supervision/ safety,Set up,Sitting Upper Body Bathing Details (indicate cue type and reason): simulated seated in recliner Lower Body Bathing: Sitting/lateral leans,Maximal assistance Lower Body Bathing Details (indicate cue type and reason): simulated via LB dressing from EOB Upper Body Dressing : Supervision/safety,Set up,Sitting Upper Body Dressing Details (indicate cue type and reason): donned clean gown Lower Body Dressing: Maximal assistance,Sitting/lateral leans Lower Body Dressing Details (indicate cue type and reason): to reposition KI from EOB Toilet Transfer: Moderate assistance,RW Toilet Transfer Details (indicate cue type and reason): unable to pivot but MOD A to rise into standing Toileting- Clothing Manipulation and Hygiene: Minimal assistance,Sitting/lateral  lean Functional mobility during ADLs: Moderate assistance,Rolling walker (sit<>stand only) General ADL Comments: pt limited by decreased activity tolerance, impaired balance and generlized weakness   Cognition: Cognition Overall Cognitive Status: Within Functional Limits for tasks assessed Orientation Level: Oriented X4 Cognition Arousal/Alertness: Awake/alert Behavior During Therapy: WFL for tasks assessed/performed Overall Cognitive Status: Within Functional Limits for tasks assessed     Blood pressure (!) 127/54, pulse 66, temperature 98.5 F (36.9 C), temperature source Oral, resp. rate 17, height '5\' 4"'$  (1.626 m), weight 103.1 kg, SpO2 97 %. Physical Exam Vitals and nursing note reviewed.  Constitutional:      General: She is not in acute distress.    Appearance: She is obese.  HENT:     Head: Normocephalic and atraumatic.     Right Ear: External ear normal.     Left Ear: External ear normal.     Mouth/Throat:     Mouth: Mucous membranes are moist.     Pharynx: No oropharyngeal exudate.     Comments: Missing numerous teeth Eyes:     Pupils: Pupils are equal, round, and reactive to light.  Cardiovascular:     Rate and Rhythm: Normal rate and regular rhythm.     Heart sounds: No murmur heard. No gallop.   Pulmonary:     Effort: Pulmonary effort is normal. No respiratory distress.     Breath sounds: No wheezing or rales.  Abdominal:     General: Bowel sounds are normal. There is no distension.     Palpations: Abdomen is soft.     Tenderness: There is no abdominal tenderness.  Musculoskeletal:     Cervical back: Normal range of motion.     Comments: L-BKA incision with edema, min serosanguinous drainage on dressing and sutures intact.  Right 2nd toe-- with dry gangrene and in process of being auto-amputated medially.    Skin:    General: Skin is warm.     Comments: Chronic changes in RLE, right second toe gangrenous, proximal portion of toe remains intact. Skin very dry  in foot/leg.  Neurological:  Mental Status: She is alert and oriented to person, place, and time.     Comments: Alert and oriented x 3. Normal insight and awareness. Intact Memory. Normal language and speech. Cranial nerve exam unremarkable. UE motor 4+ to 5/5. RLE: 3+/5 HF, 4/5 KE and 3-4/5 ADF/PF. LLE: able to lift leg off bed about 6". Decreased LT RLE distally.  Psychiatric:        Mood and Affect: Mood normal.        Behavior: Behavior normal.        Lab Results Last 48 Hours        Results for orders placed or performed during the hospital encounter of 06/08/20 (from the past 48 hour(s))  Glucose, capillary     Status: Abnormal    Collection Time: 06/14/20 11:57 AM  Result Value Ref Range    Glucose-Capillary 115 (H) 70 - 99 mg/dL      Comment: Glucose reference range applies only to samples taken after fasting for at least 8 hours.  Glucose, capillary     Status: Abnormal    Collection Time: 06/14/20  4:33 PM  Result Value Ref Range    Glucose-Capillary 189 (H) 70 - 99 mg/dL      Comment: Glucose reference range applies only to samples taken after fasting for at least 8 hours.  Glucose, capillary     Status: Abnormal    Collection Time: 06/14/20  8:05 PM  Result Value Ref Range    Glucose-Capillary 103 (H) 70 - 99 mg/dL      Comment: Glucose reference range applies only to samples taken after fasting for at least 8 hours.  Renal function panel     Status: Abnormal    Collection Time: 06/15/20  2:01 AM  Result Value Ref Range    Sodium 133 (L) 135 - 145 mmol/L    Potassium 4.7 3.5 - 5.1 mmol/L    Chloride 95 (L) 98 - 111 mmol/L    CO2 26 22 - 32 mmol/L    Glucose, Bld 123 (H) 70 - 99 mg/dL      Comment: Glucose reference range applies only to samples taken after fasting for at least 8 hours.    BUN 42 (H) 8 - 23 mg/dL    Creatinine, Ser 7.46 (H) 0.44 - 1.00 mg/dL    Calcium 8.3 (L) 8.9 - 10.3 mg/dL    Phosphorus 5.5 (H) 2.5 - 4.6 mg/dL    Albumin 2.1 (L) 3.5 - 5.0  g/dL    GFR, Estimated 6 (L) >60 mL/min      Comment: (NOTE) Calculated using the CKD-EPI Creatinine Equation (2021)      Anion gap 12 5 - 15      Comment: Performed at [facility] [address] [place], [place] [address]  CBC     Status: Abnormal    Collection Time: 06/15/20  2:01 AM  Result Value Ref Range    WBC 14.7 (H) 4.0 - 10.5 K/uL    RBC 3.26 (L) 3.87 - 5.11 MIL/uL    Hemoglobin 8.3 (L) 12.0 - 15.0 g/dL    HCT 26.9 (L) 36.0 - 46.0 %    MCV 82.5 80.0 - 100.0 fL    MCH 25.5 (L) 26.0 - 34.0 pg    MCHC 30.9 30.0 - 36.0 g/dL    RDW 17.3 (H) 11.5 - 15.5 %    Platelets 600 (H) 150 - 400 K/uL    nRBC 0.0 0.0 - 0.2 %  Comment: Performed at [facility], [place] [address], [place], [place] [address]  Glucose, capillary     Status: Abnormal    Collection Time: 06/15/20  6:28 AM  Result Value Ref Range    Glucose-Capillary 112 (H) 70 - 99 mg/dL      Comment: Glucose reference range applies only to samples taken after fasting for at least 8 hours.  Glucose, capillary     Status: Abnormal    Collection Time: 06/15/20 11:54 AM  Result Value Ref Range    Glucose-Capillary 121 (H) 70 - 99 mg/dL      Comment: Glucose reference range applies only to samples taken after fasting for at least 8 hours.  Glucose, capillary     Status: Abnormal    Collection Time: 06/15/20  4:14 PM  Result Value Ref Range    Glucose-Capillary 189 (H) 70 - 99 mg/dL      Comment: Glucose reference range applies only to samples taken after fasting for at least 8 hours.  Glucose, capillary     Status: Abnormal    Collection Time: 06/15/20  7:39 PM  Result Value Ref Range    Glucose-Capillary 137 (H) 70 - 99 mg/dL      Comment: Glucose reference range applies only to samples taken after fasting for at least 8 hours.  Renal function panel     Status: Abnormal    Collection Time: 06/16/20  1:33 AM  Result Value Ref Range    Sodium 130 (L) 135 - 145 mmol/L    Potassium 4.9 3.5 - 5.1 mmol/L     Chloride 92 (L) 98 - 111 mmol/L    CO2 24 22 - 32 mmol/L    Glucose, Bld 137 (H) 70 - 99 mg/dL      Comment: Glucose reference range applies only to samples taken after fasting for at least 8 hours.    BUN 60 (H) 8 - 23 mg/dL    Creatinine, Ser 9.37 (H) 0.44 - 1.00 mg/dL    Calcium 8.2 (L) 8.9 - 10.3 mg/dL    Phosphorus 6.2 (H) 2.5 - 4.6 mg/dL    Albumin 2.2 (L) 3.5 - 5.0 g/dL    GFR, Estimated 4 (L) >60 mL/min      Comment: (NOTE) Calculated using the CKD-EPI Creatinine Equation (2021)      Anion gap 14 5 - 15      Comment: Performed at [facility] [address], [place], [place] [address]  Glucose, capillary     Status: Abnormal    Collection Time: 06/16/20  9:17 AM  Result Value Ref Range    Glucose-Capillary 116 (H) 70 - 99 mg/dL      Comment: Glucose reference range applies only to samples taken after fasting for at least 8 hours.      Imaging Results (Last 48 hours)  No results found.           Medical Problem List and Plan: 1.  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              -patient may shower if residual limb is water-proofed             -ELOS/Goals: 7-10 days, Mod I at w/c level 2.  Antithrombotics: -DVT/anticoagulation:  Pharmaceutical: Heparin             -antiplatelet therapy: ASA/Plavix 3. Pain Management:  Oxycodone prn--has not used any  for 48 hours.   4. Mood: LCSW to follow for evaluation and support.              -antipsychotic agents: N/A 5. Neuropsych: This patient is capable of making decisions on her own behalf. 6. Skin/Wound Care: Protein supplement for hypoalbuminemia               --monitor wound for healing.              -apply dry dressing and ACE to residual limb             -apply small amount of gauze to protect gangrenous second toe which is about to auto-amputate  7. Fluids/Electrolytes/Nutrition: Strict I/O. Labs with HD. 1200cc/FR. 8. ESRD: Schedule HD at the end of  the day on MWF to help with tolerance of therapy.              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  9. T2DM with nephropathy: Hgb A1c- 6.4 on last check. Was on Lantus [id number] units with Trulicity 1.5 mg/wk.              --Monitor BS ac/hs and use SSI for elevated BS             --Continue to titrate Lantus to home dose as indicated .  10 HTN: Monitor BP tid. Has been labile             --Continue Metoprolol and amlodipine. 11. Leucocytosis: Monitor for fevers and other signs of infection. 12. PVD: Continue Plavix, ASA and Lipitor 13. Morbid obesity: BMI 39.01.             --educate on diet as well as importance of weight loss to help promote health and mobility.  14. Anemia of chronic disease: Being managed with weekly Aranesp.  15. Thrombocytosis: Continue to trend--improved form peak of 774-->600           Bary Leriche, PA-C 06/16/2020  I have personally performed a face to face diagnostic evaluation of this patient and formulated the key components of the plan.  Additionally, I have personally reviewed laboratory data, imaging studies, as well as relevant notes and concur with the physician assistant's documentation above.  The patient's status has not changed from the original H&P.  Any changes in documentation from the acute care chart have been noted above.  Meredith Staggers, MD, [facility]

## 2020-06-17 LAB — RENAL FUNCTION PANEL
Albumin: 2.2 g/dL — ABNORMAL LOW (ref 3.5–5.0)
Anion gap: 13 (ref 5–15)
BUN: 82 mg/dL — ABNORMAL HIGH (ref 8–23)
CO2: 22 mmol/L (ref 22–32)
Calcium: 8.6 mg/dL — ABNORMAL LOW (ref 8.9–10.3)
Chloride: 94 mmol/L — ABNORMAL LOW (ref 98–111)
Creatinine, Ser: 11.13 mg/dL — ABNORMAL HIGH (ref 0.44–1.00)
GFR, Estimated: 4 mL/min — ABNORMAL LOW (ref >60)
Glucose, Bld: 169 mg/dL — ABNORMAL HIGH (ref 70–99)
Phosphorus: 7.4 mg/dL — ABNORMAL HIGH (ref 2.5–4.6)
Potassium: 5 mmol/L (ref 3.5–5.1)
Sodium: 129 mmol/L — ABNORMAL LOW (ref 135–145)

## 2020-06-17 LAB — CBC
HCT: 25.6 % — ABNORMAL LOW (ref 36.0–46.0)
Hemoglobin: 7.9 g/dL — ABNORMAL LOW (ref 12.0–15.0)
MCH: 25.2 pg — ABNORMAL LOW (ref 26.0–34.0)
MCHC: 30.9 g/dL (ref 30.0–36.0)
MCV: 81.8 fL (ref 80.0–100.0)
Platelets: 646 10*3/uL — ABNORMAL HIGH (ref 150–400)
RBC: 3.13 MIL/uL — ABNORMAL LOW (ref 3.87–5.11)
RDW: 17.1 % — ABNORMAL HIGH (ref 11.5–15.5)
WBC: 13.1 10*3/uL — ABNORMAL HIGH (ref 4.0–10.5)
nRBC: 0 % (ref 0.0–0.2)

## 2020-06-17 LAB — GLUCOSE, CAPILLARY
Glucose-Capillary: 119 mg/dL — ABNORMAL HIGH (ref 70–99)
Glucose-Capillary: 137 mg/dL — ABNORMAL HIGH (ref 70–99)
Glucose-Capillary: 174 mg/dL — ABNORMAL HIGH (ref 70–99)

## 2020-06-17 MED ORDER — DARBEPOETIN ALFA 60 MCG/0.3ML IJ SOSY
PREFILLED_SYRINGE | INTRAMUSCULAR | Status: AC
  Filled 2020-06-17: qty 0.3

## 2020-06-17 MED ORDER — HEPARIN SODIUM (PORCINE) 1000 UNIT/ML DIALYSIS: 20.0000 [IU]/kg | INTRAMUSCULAR | Status: DC | PRN

## 2020-06-17 MED ORDER — LIDOCAINE-PRILOCAINE 2.5-2.5 % EX CREA: 1.0000 "application " | TOPICAL_CREAM | CUTANEOUS | Status: DC | PRN

## 2020-06-17 MED ORDER — LIDOCAINE HCL (PF) 1 % IJ SOLN: 5.0000 mL | INTRAMUSCULAR | Status: DC | PRN

## 2020-06-17 MED ORDER — PENTAFLUOROPROP-TETRAFLUOROETH EX AERO: 1.0000 "application " | INHALATION_SPRAY | CUTANEOUS | Status: DC | PRN

## 2020-06-17 MED ORDER — HEPARIN SODIUM (PORCINE) 1000 UNIT/ML DIALYSIS: 1000.0000 [IU] | INTRAMUSCULAR | Status: DC | PRN

## 2020-06-17 MED ORDER — SODIUM CHLORIDE 0.9 % IV SOLN: 100.0000 mL | INTRAVENOUS | Status: DC | PRN

## 2020-06-17 MED ORDER — ALTEPLASE 2 MG IJ SOLR: 2.0000 mg | Freq: Once | INTRAMUSCULAR | Status: DC | PRN

## 2020-06-17 MED ORDER — DOXERCALCIFEROL 4 MCG/2ML IV SOLN
INTRAVENOUS | Status: AC
  Filled 2020-06-17: qty 2

## 2020-06-17 NOTE — Progress Notes (Signed)
Physical Therapy Session Note  Patient Details  Name: Christy Zhang MRN: [id number] Date of Birth: 30-Aug-1955  Today's Date: 06/17/2020 PT Individual Time: 1650-1730 PT Individual Time Calculation (min): 40 min   Short Term Goals: Week 1:  PT Short Term Goal 1 (Week 1): STG=LTG due to ELOS  Skilled Therapeutic Interventions/Progress Updates:   Pt received supine in bed and agreeable to PT at bed level due to mild stomach discomfort but does not rate. PT instructed pt in supine and sidelying therex:  SAQ 2x 12 Clam shell with level 2 tband 2 x 12  Reciprocal march with level 2 tband 2 x 12 Bridge through bolster 2x 10 Hip abduction from sidelying x 10 Hip extension from sidelying 2 x 10  L knee flexion/extension from sidelying  SLR x 10 Cues for full ROM and decreased speed of movement to maximize strengthening aspect of movements.  Pt performed rolling R and L with supervision assist and as well as scotting to [facility] with heavy use of bed features.   Therapy Documentation Precautions:  Precautions Precautions: Fall Precaution Comments: falls, R 2nd toe wound Required Braces or Orthoses: Knee Immobilizer - Left Knee Immobilizer - Left: On at all times Restrictions Weight Bearing Restrictions: Yes LLE Weight Bearing: Non weight bearing Vital Signs: Therapy Vitals Temp: 97.6 F (36.4 C) Pulse Rate: 67 Resp: 17 BP: (!) 127/57 Patient Position (if appropriate): Lying Oxygen Therapy SpO2: 99 % O2 Device: Room Air Pain: denie   Therapy/Group: Individual Therapy  Lorie Phenix 06/17/2020, 5:40 PM

## 2020-06-17 NOTE — Procedures (Signed)
Patient seen on Hemodialysis. BP (!) 137/58 (BP Location: Left Arm)   Pulse 67   Temp 98 F (36.7 C) (Oral)   Resp 18   Ht '5\' 3"'$  (1.6 m)   Wt 104 kg   LMP  (LMP Unknown)   SpO2 96%   BMI 40.61 kg/m   QB 400, UF goal 3L Tolerating treatment without complaints at this time.   Elmarie Shiley MD [facility]. Office # [phone] Pager # [phone] 7:28 AM

## 2020-06-17 NOTE — Plan of Care (Signed)
  Problem: RH Balance Goal: LTG: Patient will maintain dynamic sitting balance (OT) Description: LTG:  Patient will maintain dynamic sitting balance with assistance during activities of daily living (OT) Flowsheets (Taken 06/17/2020 1526) LTG: Pt will maintain dynamic sitting balance during ADLs with: Independent with assistive device Goal: LTG Patient will maintain dynamic standing with ADLs (OT) Description: LTG:  Patient will maintain dynamic standing balance with assist during activities of daily living (OT)  Flowsheets (Taken 06/17/2020 1526) LTG: Pt will maintain dynamic standing balance during ADLs with: Supervision/Verbal cueing   Problem: Sit to Stand Goal: LTG:  Patient will perform sit to stand in prep for activites of daily living with assistance level (OT) Description: LTG:  Patient will perform sit to stand in prep for activites of daily living with assistance level (OT) Flowsheets (Taken 06/17/2020 1526) LTG: PT will perform sit to stand in prep for activites of daily living with assistance level: Independent with assistive device   Problem: RH Eating Goal: LTG Patient will perform eating w/assist, cues/equip (OT) Description: LTG: Patient will perform eating with assist, with/without cues using equipment (OT) Flowsheets (Taken 06/17/2020 1526) LTG: Pt will perform eating with assistance level of: Independent   Problem: RH Grooming Goal: LTG Patient will perform grooming w/assist,cues/equip (OT) Description: LTG: Patient will perform grooming with assist, with/without cues using equipment (OT) Flowsheets (Taken 06/17/2020 1526) LTG: Pt will perform grooming with assistance level of: Independent with assistive device    Problem: RH Bathing Goal: LTG Patient will bathe all body parts with assist levels (OT) Description: LTG: Patient will bathe all body parts with assist levels (OT) Flowsheets (Taken 06/17/2020 1526) LTG: Pt will perform bathing with assistance level/cueing:  Minimal Assistance - Patient > 75%   Problem: RH Dressing Goal: LTG Patient will perform upper body dressing (OT) Description: LTG Patient will perform upper body dressing with assist, with/without cues (OT). Flowsheets (Taken 06/17/2020 1526) LTG: Pt will perform upper body dressing with assistance level of: Independent with assistive device Goal: LTG Patient will perform lower body dressing w/assist (OT) Description: LTG: Patient will perform lower body dressing with assist, with/without cues in positioning using equipment (OT) Flowsheets (Taken 06/17/2020 1526) LTG: Pt will perform lower body dressing with assistance level of: Minimal Assistance - Patient > 75%   Problem: RH Toileting Goal: LTG Patient will perform toileting task (3/3 steps) with assistance level (OT) Description: LTG: Patient will perform toileting task (3/3 steps) with assistance level (OT)  Flowsheets (Taken 06/17/2020 1526) LTG: Pt will perform toileting task (3/3 steps) with assistance level: Supervision/Verbal cueing   Problem: RH Toilet Transfers Goal: LTG Patient will perform toilet transfers w/assist (OT) Description: LTG: Patient will perform toilet transfers with assist, with/without cues using equipment (OT) Flowsheets (Taken 06/17/2020 1526) LTG: Pt will perform toilet transfers with assistance level of: Supervision/Verbal cueing

## 2020-06-17 NOTE — Progress Notes (Signed)
Wound care completed. Pt tolerated well. RN will continue to monitor.

## 2020-06-17 NOTE — Evaluation (Signed)
Physical Therapy Assessment and Plan  Patient Details  Name: Christy Zhang MRN: [id number] Date of Birth: 1955/06/09  PT Diagnosis: Difficulty walking, Impaired sensation and Muscle weakness Rehab Potential: Good ELOS: 9-12 days   Today's Date: 06/17/2020 PT Individual Time: 1115-1200 PT Individual Time Calculation (min): 45 min    Hospital Problem: Principal Problem:   Below-knee amputation of left lower extremity ([facility])   Past Medical History:  Past Medical History:  Diagnosis Date  . Anemia   . Arthritis   . Asthma    in the past  . CHF (congestive heart failure) ([place])   . Chronic kidney disease    Renal failure, dialysis on M/W/F  . 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 End stage renal disease ([place])   . End stage renal disease on dialysis ([place])   . Hemodialysis access, arteriovenous graft ([place])   . Hyperlipidemia   . Hypertension   . Neuromuscular disorder [facility])    Past Surgical History:  Past Surgical History:  Procedure Laterality Date  .  LEFT BELOW KNEE AMPUTATION (Left Knee)  06/08/2020  . A/V FISTULAGRAM Right 02/04/2017   Procedure: A/V Fistulagram;  Surgeon: Waynetta Sandy, MD;  Location: [place] [facility];  Service: Cardiovascular;  Laterality: Right;  . ABDOMINAL AORTOGRAM W/LOWER EXTREMITY N/A 11/18/2019   Procedure: ABDOMINAL AORTOGRAM W/LOWER EXTREMITY;  Surgeon: [name], MD;  Location: [place] [facility];  Service: Cardiovascular;  Laterality: N/A;  . ABDOMINAL AORTOGRAM W/LOWER EXTREMITY N/A 02/23/2020   Procedure: ABDOMINAL AORTOGRAM W/LOWER EXTREMITY;  Surgeon: [name], MD;  Location: [place] [facility];  Service: Cardiovascular;  Laterality: N/A;  Bilateral  . AMPUTATION Left 06/08/2020   Procedure: LEFT BELOW KNEE AMPUTATION;  Surgeon: Cherre Robins, MD;  Location: [place];  Service: Vascular;  Laterality: Left;  . AV FISTULA PLACEMENT Right 08/25/2014   Procedure: RIGHT BRACHIOCEPHALIC  ARTERIOVENOUS (AV) FISTULA CREATION;  Surgeon: Mal Misty, MD;  Location: [place];  Service: Vascular;  Laterality: Right;  . AV FISTULA PLACEMENT Right 03/18/2019   Procedure: INSERTION OF ARTERIOVENOUS (AV) GORE-TEX GRAFT RIGHT ARM;  Surgeon: Serafina Mitchell, MD;  Location: [place];  Service: Vascular;  Laterality: Right;  . BASCILIC VEIN TRANSPOSITION Right 12/24/2018   Procedure: FIRST STAGE BASILIC VEIN TRANSPOSITION RIGHT UPPER ARM;  Surgeon: Serafina Mitchell, MD;  Location: [place];  Service: Vascular;  Laterality: Right;  . FISTULA SUPERFICIALIZATION Right 11/03/2014   Procedure: RIGHT UPPER ARM FISTULA SUPERFICIALIZATION;  Surgeon: Mal Misty, MD;  Location: [place];  Service: Vascular;  Laterality: Right;  . FISTULOGRAM Right 01/16/2016   Procedure: RIGHT UPPER EXTREMITY ARTERIOVENOUS FISTULOGRAM WITH BALLOON ANGIOPLASTY;  Surgeon: Vickie Epley, MD;  Location: [facility];  Service: Vascular;  Laterality: Right;  . INSERTION OF DIALYSIS CATHETER     X4  . IR FLUORO GUIDE CV LINE RIGHT  12/03/2018  . IR REMOVAL TUN CV CATH W/O FL  07/06/2019  . IR THROMBECTOMY AV FISTULA W/THROMBOLYSIS/PTA INC/SHUNT/IMG RIGHT Right 12/03/2018  . IR THROMBECTOMY AV FISTULA W/THROMBOLYSIS/PTA/STENT INC/SHUNT/IMG RT Right 10/11/2019  . IR US GUIDE VASC ACCESS RIGHT  12/03/2018  . IR US GUIDE VASC ACCESS RIGHT  12/03/2018  . IR US GUIDE VASC ACCESS RIGHT  10/14/2019  . LIGATION OF COMPETING BRANCHES OF ARTERIOVENOUS FISTULA Right 11/03/2014   Procedure: LIGATION OF COMPETING BRANCHE x1 OF RIGHT UPPER ARM ARTERIOVENOUS FISTULA;  Surgeon: Mal Misty, MD;  Location: [place];  Service: Vascular;  Laterality: Right;  . PERIPHERAL VASCULAR BALLOON ANGIOPLASTY Right 02/04/2017   Procedure: PERIPHERAL VASCULAR BALLOON ANGIOPLASTY;  Surgeon: Waynetta Sandy, MD;  Location: [place] [facility];  Service: Cardiovascular;  Laterality: Right;  . PERIPHERAL VASCULAR BALLOON ANGIOPLASTY Right 11/18/2019   Procedure: PERIPHERAL  VASCULAR BALLOON ANGIOPLASTY;  Surgeon: [name], MD;  Location: [place] [facility];  Service: Cardiovascular;  Laterality: Right;  anterior tibial  . PERIPHERAL VASCULAR BALLOON ANGIOPLASTY  02/23/2020   Procedure: PERIPHERAL VASCULAR BALLOON ANGIOPLASTY;  Surgeon: [name], MD;  Location: [place] [facility];  Service: Cardiovascular;;  Lt  AT  . TRANSMETATARSAL AMPUTATION Left 02/24/2020   Procedure: TRANSMETATARSAL AMPUTATION LEFT;  Surgeon: Cherre Robins, MD;  Location: [place];  Service: Vascular;  Laterality: Left;  . TUBAL LIGATION      Assessment & Plan Clinical Impression: Patient is a 65 year old female with history of ESRD-HD MWF, T2DM, HTN, morbid obesity--BMI 39, PVD with gangrenous changes left foot and multiple attempts at limb salvage. BKA was recommended in December but delayed to to request for second opinion, Covid infection and inclement weather. She was admitted on 06/08/20 for L-BKA by Dr. Stanford Breed. Post op course significant for LLE pain, leucocytosis with rise in WBC to 24.2--blood cultures X 2 negative. She was maintained on pre-op cefazolin thorough 02/13 and metoprolol resumed as bradycardia resolved. Nephrology following for management of HD MWF. Therapy has been ongoing and patient limited by fatigue, L-BKA and balance deficits affecting ADLs and mobility. Patient transferred to CIR on 06/16/2020 .   Patient currently requires mod with mobility secondary to muscle weakness and muscle joint tightness, decreased cardiorespiratoy endurance and decreased standing balance, decreased balance strategies and difficulty maintaining precautions.  Prior to hospitalization, patient was modified independent  with mobility and lived with Daughter in a House home.  Home access is  Ramped entrance.  Patient will benefit from skilled PT intervention to maximize safe functional mobility, minimize fall risk and decrease caregiver burden for planned discharge home with 24  hour supervision.  Anticipate patient will benefit from follow up [place] at discharge.  PT - End of Session Activity Tolerance: Tolerates 10 - 20 min activity with multiple rests Endurance Deficit: Yes PT Assessment Rehab Potential (ACUTE/IP ONLY): Good PT Barriers to Discharge: Wound Care;Weight bearing restrictions;Medication compliance PT Patient demonstrates impairments in the following area(s): Balance;Edema;Endurance;Pain;Sensory;Skin Integrity PT Transfers Functional Problem(s): Bed Mobility;Bed to Chair;Car;Furniture;Floor PT Locomotion Functional Problem(s): Ambulation;Wheelchair Mobility;Stairs PT Plan PT Intensity: Minimum of 1-2 x/day ,45 to 90 minutes PT Frequency: 5 out of 7 days PT Duration Estimated Length of Stay: 9-12 days PT Treatment/Interventions: Ambulation/gait training;Cognitive remediation/compensation;Community reintegration;Balance/vestibular training;Discharge planning;Disease management/prevention;DME/adaptive equipment instruction;Functional mobility training;Neuromuscular re-education;Psychosocial support;Patient/family education;Pain management;Skin care/wound management;Stair training;Therapeutic Exercise;UE/LE Strength taining/ROM;UE/LE Coordination activities;Visual/perceptual remediation/compensation;Wheelchair propulsion/positioning;Splinting/orthotics;Therapeutic Activities PT Transfers Anticipated Outcome(s): Supervision assist with LRAD PT Locomotion Anticipated Outcome(s): WC level without cues or assist PT Recommendation Recommendations for Other Services: Therapeutic Recreation consult Therapeutic Recreation Interventions: Outing/community reintergration;Stress management;Kitchen group Follow Up Recommendations: Home health PT Patient destination: Home Equipment Recommended: To be determined   PT Evaluation Precautions/Restrictions    Fall NWB LLE General PT Amount of Missed Time (min): 30 Minutes PT Missed Treatment Reason: Unavailable (Comment)  (Dialysis) Vital SignsTherapy Vitals Temp: 98 F (36.7 C) Temp Source: Oral Pulse Rate: 78 Resp: 19 BP: (!) 147/58 Patient Position (if appropriate): Lying Oxygen Therapy SpO2: 100 % O2 Device: Room Air Pain Pain Assessment Pain Scale: 0-10 Pain Score: 5  Pain Type: Acute pain;Surgical pain Pain Location: Leg Pain Descriptors / Indicators: Aching Pain Frequency: Intermittent Pain Onset: On-going Pain Intervention(s): Medication (See eMAR) Home Living/Prior Functioning Home Living Available Help at Discharge: Family Type of Home: House Home Access: Ramped entrance Home Layout: Two level;Able to live on main level with bedroom/bathroom Bathroom Shower/Tub: Tub/shower unit (pt takes sponge baths) Bathroom Toilet: Standard (pt uses bedside commode) Bathroom Accessibility: Yes Additional Comments: stays in wheelchair in living room, walks with RW with asssist  Lives With: Daughter Prior Function Level of Independence: Requires assistive device for independence  Able to Take Stairs?: No Driving: No Vocation: On disability Comments: daughter takes pt to and from dialysis Vision/Perception  Perception Perception: Within Functional Limits Praxis Praxis: Intact  Cognition Overall Cognitive Status: Within Functional Limits for tasks assessed Orientation Level: Oriented X4 Safety/Judgment: Appears intact Comments: mild word finding deficits Sensation Sensation Light Touch: Appears Intact Coordination Gross Motor Movements are Fluid and Coordinated: Yes Fine Motor Movements are Fluid and Coordinated: Yes 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 Motor  Motor Motor: Other (comment) Motor - Skilled Clinical Observations: generalized weakness   Trunk/Postural Assessment  Cervical Assessment Cervical Assessment: Exceptions to [facility] Thoracic Assessment Thoracic Assessment: Exceptions to [facility] (rounded shoulders) Lumbar Assessment Lumbar Assessment: Exceptions to [facility]  (posterior pelvic tilt) Postural Control Postural Control: Within Functional Limits  Balance Balance Balance Assessed: Yes Static Sitting Balance Static Sitting - Balance Support: No upper extremity supported Static Sitting - Level of Assistance: 6: Modified independent (Device/Increase time) Dynamic Sitting Balance Dynamic Sitting - Balance Support: No upper extremity supported Dynamic Sitting - Level of Assistance: 5: Stand by assistance Static Standing Balance Static Standing - Balance Support: Bilateral upper extremity supported Static Standing - Level of Assistance: 4: Min assist Dynamic Standing Balance Dynamic Standing - Balance Support: Bilateral upper extremity supported Dynamic Standing - Level of Assistance: 3: Mod assist Extremity Assessment      RLE Assessment RLE Assessment: Within Functional Limits General Strength Comments: grossly 5/5 LLE Assessment LLE Assessment: Exceptions to [facility] General Strength Comments: at least 3/5 with mild discomfort with full ROM into flexion  Care Tool Care Tool Bed Mobility Roll left and right activity   Roll left and right assist level: Supervision/Verbal cueing    Sit to lying activity   Sit to lying assist level: Minimal Assistance - Patient > 75%    Lying to sitting edge of bed activity   Lying to sitting edge of bed assist level: Minimal Assistance - Patient > 75%     Care Tool Transfers Sit to stand transfer   Sit to stand assist level: Moderate Assistance - Patient 50 - 74% (with RW)    Chair/bed transfer   Chair/bed transfer assist level: Moderate Assistance - Patient 50 - 74% (with RW)     Scientist, research (physical sciences) transfer activity did not occur: Safety/medical concerns        Care Tool Locomotion Ambulation Ambulation activity did not occur: Safety/medical concerns        Walk 10 feet activity Walk 10 feet activity did not occur: Safety/medical concerns       Walk 50 feet with 2 turns  activity Walk 50 feet with 2 turns activity did not occur: Safety/medical concerns      Walk 150 feet activity Walk 150 feet activity did not occur: Safety/medical concerns      Walk 10 feet on uneven surfaces activity Walk 10  feet on uneven surfaces activity did not occur: Safety/medical concerns      Stairs Stair activity did not occur: Safety/medical concerns        Walk up/down 1 step activity Walk up/down 1 step or curb (drop down) activity did not occur: Safety/medical concerns     Walk up/down 4 steps activity did not occuR: Safety/medical concerns  Walk up/down 4 steps activity      Walk up/down 12 steps activity Walk up/down 12 steps activity did not occur: Safety/medical concerns      Pick up small objects from floor Pick up small object from the floor (from standing position) activity did not occur: Safety/medical concerns      Wheelchair   Type of Wheelchair: Manual   Wheelchair assist level: Minimal Assistance - Patient > 75% Max wheelchair distance: 150  Wheel 50 feet with 2 turns activity   Assist Level: Minimal Assistance - Patient > 75%  Wheel 150 feet activity   Assist Level: Minimal Assistance - Patient > 75%    Refer to Care Plan for Long Term Goals  SHORT TERM GOAL WEEK 1 PT Short Term Goal 1 (Week 1): STG=LTG due to ELOS  Recommendations for other services: Therapeutic Recreation  Stress management and Outing/community reintegration  Skilled Therapeutic Intervention  Pt initially in dialysis, with RN reporting that she would return within the hour. PT returned in 30 minutes and Pt received supine in bed and agreeable to PT.   PT instructed patient in PT Evaluation and initiated treatment intervention; see above for results. PT educated patient in [place], rehab potential, rehab goals, and discharge recommendations along with recommendation for follow-up rehabilitation services.   Supine>sit transfer with supervision assist as listed below with heavy  use of bed rails. Stand pivot transfer to and from [facility] with min assist and lateral shuffle to complete transfer; pt unable to clear foot with lateral step.  WC mobility with min-supervision assist x 152ft with mild veer to the L noted. Gait training with BUE support on parallel bars. Patient returned to room and left sitting in [facility] with call bell in reach and all needs met.     Mobility Bed Mobility Bed Mobility: Rolling Right;Rolling Left;Supine to Sit;Sit to Supine Rolling Right: Supervision/verbal cueing Rolling Left: Supervision/Verbal cueing Supine to Sit: Supervision/Verbal cueing Sit to Supine: Supervision/Verbal cueing Transfers Transfers: Sit to Stand;Stand Pivot Transfers Sit to Stand: Minimal Assistance - Patient > 75% Stand Pivot Transfers: Minimal Assistance - Patient > 75% Stand Pivot Transfer Details: Verbal cues for technique;Verbal cues for precautions/safety;Verbal cues for safe use of DME/AE Transfer (Assistive device): Rolling walker Locomotion  Gait Ambulation: Yes Gait Assistance: Minimal Assistance - Patient > 75% Gait Distance (Feet): 4 Feet Assistive device: Parallel bars Gait Assistance Details: Manual facilitation for weight shifting;Manual facilitation for placement;Verbal cues for safe use of DME/AE;Verbal cues for gait pattern;Verbal cues for precautions/safety Gait Gait: Yes Gait Pattern: Step-to pattern;Poor foot clearance - right Stairs / Additional Locomotion Stairs: No Wheelchair Mobility Wheelchair Mobility: Yes Wheelchair Assistance: Minimal assistance - Patient >75% Wheelchair Propulsion: Both upper extremities Wheelchair Parts Management: Needs assistance Distance: 150   Discharge Criteria: Patient will be discharged from PT if patient refuses treatment 3 consecutive times without medical reason, if treatment goals not met, if there is a change in medical status, if patient makes no progress towards goals or if patient is discharged from  hospital.  The above assessment, treatment plan, treatment alternatives and goals were discussed and mutually agreed upon:  by patient  Lorie Phenix 06/17/2020, 1:54 PM

## 2020-06-17 NOTE — Plan of Care (Signed)
  Problem: RH Balance Goal: LTG Patient will maintain dynamic sitting balance (PT) Description: LTG:  Patient will maintain dynamic sitting balance with assistance during mobility activities (PT) Flowsheets (Taken 06/17/2020 1532) LTG: Pt will maintain dynamic sitting balance during mobility activities with:: Independent with assistive device  Goal: LTG Patient will maintain dynamic standing balance (PT) Description: LTG:  Patient will maintain dynamic standing balance with assistance during mobility activities (PT) Flowsheets (Taken 06/17/2020 1532) LTG: Pt will maintain dynamic standing balance during mobility activities with:: Supervision/Verbal cueing   Problem: RH Bed Mobility Goal: LTG Patient will perform bed mobility with assist (PT) Description: LTG: Patient will perform bed mobility with assistance, with/without cues (PT). Flowsheets (Taken 06/17/2020 1532) LTG: Pt will perform bed mobility with assistance level of: Supervision/Verbal cueing   Problem: RH Bed to Chair Transfers Goal: LTG Patient will perform bed/chair transfers w/assist (PT) Description: LTG: Patient will perform bed to chair transfers with assistance (PT). Flowsheets (Taken 06/17/2020 1532) LTG: Pt will perform Bed to Chair Transfers with assistance level: Supervision/Verbal cueing   Problem: RH Car Transfers Goal: LTG Patient will perform car transfers with assist (PT) Description: LTG: Patient will perform car transfers with assistance (PT). Flowsheets (Taken 06/17/2020 1532) LTG: Pt will perform car transfers with assist:: Minimal Assistance - Patient > 75%   Problem: RH Ambulation Goal: LTG Patient will ambulate in controlled environment (PT) Description: LTG: Patient will ambulate in a controlled environment, # of feet with assistance (PT). Flowsheets (Taken 06/17/2020 1532) LTG: Pt will ambulate in controlled environ  assist needed:: Supervision/Verbal cueing LTG: Ambulation distance in controlled  environment: 85f with LRAD   Problem: RH Wheelchair Mobility Goal: LTG Patient will propel w/c in controlled environment (PT) Description: LTG: Patient will propel wheelchair in controlled environment, # of feet with assist (PT) Flowsheets (Taken 06/17/2020 1532) LTG: Pt will propel w/c in controlled environ  assist needed:: Independent with assistive device LTG: Propel w/c distance in controlled environment: [id number]: LTG Patient will propel w/c in home environment (PT) Description: LTG: Patient will propel wheelchair in home environment, # of feet with assistance (PT). Flowsheets (Taken 06/17/2020 1532) LTG: Pt will propel w/c in home environ  assist needed:: Independent with assistive device LTG: Propel w/c distance in home environment: [id number]

## 2020-06-17 NOTE — Evaluation (Signed)
Occupational Therapy Assessment and Plan  Patient Details  Name: KETZALY CARDELLA MRN: [id number] Date of Birth: 1955/08/01  OT Diagnosis: abnormal posture, muscle weakness (generalized) and decreased activity tolerance Rehab Potential: Rehab Potential (ACUTE ONLY): Good ELOS: 13-15 days   Today's Date: 06/17/2020 OT Individual Time: 1258-1340 OT Individual Time Calculation (min): 42 min     Hospital Problem: Principal Problem:   Below-knee amputation of left lower extremity ([place])   Past Medical History:  Past Medical History:  Diagnosis Date  . Anemia   . Arthritis   . Asthma    in the past  . CHF (congestive heart failure) ([place])   . Chronic kidney disease    Renal failure, dialysis on M/W/F  . 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 End stage renal disease ([place])   . End stage renal disease on dialysis ([place])   . Hemodialysis access, arteriovenous graft ([place])   . Hyperlipidemia   . Hypertension   . Neuromuscular disorder [facility])    Past Surgical History:  Past Surgical History:  Procedure Laterality Date  .  LEFT BELOW KNEE AMPUTATION (Left Knee)  06/08/2020  . A/V FISTULAGRAM Right 02/04/2017   Procedure: A/V Fistulagram;  Surgeon: Waynetta Sandy, MD;  Location: [place] [facility];  Service: Cardiovascular;  Laterality: Right;  . ABDOMINAL AORTOGRAM W/LOWER EXTREMITY N/A 11/18/2019   Procedure: ABDOMINAL AORTOGRAM W/LOWER EXTREMITY;  Surgeon: [name], MD;  Location: [place] [facility];  Service: Cardiovascular;  Laterality: N/A;  . ABDOMINAL AORTOGRAM W/LOWER EXTREMITY N/A 02/23/2020   Procedure: ABDOMINAL AORTOGRAM W/LOWER EXTREMITY;  Surgeon: [name], MD;  Location: [place] [facility];  Service: Cardiovascular;  Laterality: N/A;  Bilateral  . AMPUTATION Left 06/08/2020   Procedure: LEFT BELOW KNEE AMPUTATION;  Surgeon: Cherre Robins, MD;  Location: [place];  Service: Vascular;  Laterality: Left;  . AV FISTULA PLACEMENT  Right 08/25/2014   Procedure: RIGHT BRACHIOCEPHALIC ARTERIOVENOUS (AV) FISTULA CREATION;  Surgeon: Mal Misty, MD;  Location: [place];  Service: Vascular;  Laterality: Right;  . AV FISTULA PLACEMENT Right 03/18/2019   Procedure: INSERTION OF ARTERIOVENOUS (AV) GORE-TEX GRAFT RIGHT ARM;  Surgeon: Serafina Mitchell, MD;  Location: [place];  Service: Vascular;  Laterality: Right;  . BASCILIC VEIN TRANSPOSITION Right 12/24/2018   Procedure: FIRST STAGE BASILIC VEIN TRANSPOSITION RIGHT UPPER ARM;  Surgeon: Serafina Mitchell, MD;  Location: [place];  Service: Vascular;  Laterality: Right;  . FISTULA SUPERFICIALIZATION Right 11/03/2014   Procedure: RIGHT UPPER ARM FISTULA SUPERFICIALIZATION;  Surgeon: Mal Misty, MD;  Location: [place];  Service: Vascular;  Laterality: Right;  . FISTULOGRAM Right 01/16/2016   Procedure: RIGHT UPPER EXTREMITY ARTERIOVENOUS FISTULOGRAM WITH BALLOON ANGIOPLASTY;  Surgeon: Vickie Epley, MD;  Location: [facility];  Service: Vascular;  Laterality: Right;  . INSERTION OF DIALYSIS CATHETER     X4  . IR FLUORO GUIDE CV LINE RIGHT  12/03/2018  . IR REMOVAL TUN CV CATH W/O FL  07/06/2019  . IR THROMBECTOMY AV FISTULA W/THROMBOLYSIS/PTA INC/SHUNT/IMG RIGHT Right 12/03/2018  . IR THROMBECTOMY AV FISTULA W/THROMBOLYSIS/PTA/STENT INC/SHUNT/IMG RT Right 10/11/2019  . IR US GUIDE VASC ACCESS RIGHT  12/03/2018  . IR US GUIDE VASC ACCESS RIGHT  12/03/2018  . IR US GUIDE VASC ACCESS RIGHT  10/14/2019  . LIGATION OF COMPETING BRANCHES OF ARTERIOVENOUS FISTULA Right 11/03/2014   Procedure: LIGATION OF COMPETING BRANCHE x1 OF RIGHT UPPER ARM ARTERIOVENOUS FISTULA;  Surgeon: Jeneen Rinks  Rockwell Alexandria, MD;  Location: [place];  Service: Vascular;  Laterality: Right;  . PERIPHERAL VASCULAR BALLOON ANGIOPLASTY Right 02/04/2017   Procedure: PERIPHERAL VASCULAR BALLOON ANGIOPLASTY;  Surgeon: Waynetta Sandy, MD;  Location: [place] [facility];  Service: Cardiovascular;  Laterality: Right;  . PERIPHERAL VASCULAR BALLOON  ANGIOPLASTY Right 11/18/2019   Procedure: PERIPHERAL VASCULAR BALLOON ANGIOPLASTY;  Surgeon: [name], MD;  Location: [place] [facility];  Service: Cardiovascular;  Laterality: Right;  anterior tibial  . PERIPHERAL VASCULAR BALLOON ANGIOPLASTY  02/23/2020   Procedure: PERIPHERAL VASCULAR BALLOON ANGIOPLASTY;  Surgeon: [name], MD;  Location: [place] [facility];  Service: Cardiovascular;;  Lt  AT  . TRANSMETATARSAL AMPUTATION Left 02/24/2020   Procedure: TRANSMETATARSAL AMPUTATION LEFT;  Surgeon: Cherre Robins, MD;  Location: [place];  Service: Vascular;  Laterality: Left;  . TUBAL LIGATION      Assessment & Plan Clinical Impression: Patient is a 65 y.o. year old female with recent admission with history of ESRD-HD MWF, T2DM, HTN, morbid obesity--BMI 39, PVD with gangrenous changes left foot and multiple attempts at limb salvage. BKA was recommended in December but delayed to to request for second opinion, Covid infection and inclement weather. She was admitted on 06/08/20 for L-BKA by Dr. Stanford Breed. Post op course significant for LLE pain, leucocytosis with rise in WBC to 24.2--blood cultures X 2 negative. She was maintained on pre-op cefazolin thorough 02/13 and metoprolol resumed as bradycardia resolved. Nephrology following for management of HD MWF. Therapy has been ongoing and patient limited by fatigue, L-BKA and balance deficits affecting ADLs and mobility. CIR recommended due to functional decline.Patient transferred to CIR on 06/16/2020 .    Patient currently requires mod with basic self-care skills secondary to muscle weakness and decreased cardiorespiratoy endurance.  Prior to hospitalization, patient could complete BADL with min.  Patient will benefit from skilled intervention to decrease level of assist with basic self-care skills and increase independence with basic self-care skills prior to discharge home with care partner.  Anticipate patient will require minimal  physical assistance and follow up home health.  OT - End of Session Activity Tolerance: Tolerates 10 - 20 min activity with multiple rests Endurance Deficit: Yes Endurance Deficit Description: req increased time/freq rest breaks to complete ADL OT Assessment Rehab Potential (ACUTE ONLY): Good OT Patient demonstrates impairments in the following area(s): Balance;Skin Integrity;Endurance;Motor;Pain OT Basic ADL's Functional Problem(s): Eating;Grooming;Bathing;Dressing;Toileting OT Transfers Functional Problem(s): Toilet OT Plan OT Intensity: Minimum of 1-2 x/day, 45 to 90 minutes OT Frequency: 5 out of 7 days OT Duration/Estimated Length of Stay: 13-15 days OT Treatment/Interventions: Balance/vestibular training;Disease mangement/prevention;Neuromuscular re-education;Self Care/advanced ADL retraining;Therapeutic Exercise;Wheelchair propulsion/positioning;Pain management;DME/adaptive equipment instruction;Skin care/wound managment;UE/LE Strength taining/ROM;UE/LE Coordination activities;Patient/family education;Community reintegration;Discharge planning;Functional mobility training;Psychosocial support;Therapeutic Activities OT Self Feeding Anticipated Outcome(s): mod I OT Basic Self-Care Anticipated Outcome(s): min A OT Toileting Anticipated Outcome(s): close S OT Bathroom Transfers Anticipated Outcome(s): min A OT Recommendation Recommendations for Other Services: Therapeutic Recreation consult Therapeutic Recreation Interventions: Pet therapy;Stress management Patient destination: Home Follow Up Recommendations: Home health OT Equipment Recommended: None recommended by OT (Pt has req DME)   OT Evaluation Precautions/Restrictions  Precautions Precautions: Fall Precaution Comments: falls, R 2nd toe wound Required Braces or Orthoses: Knee Immobilizer - Left Knee Immobilizer - Left: On at all times Restrictions Weight Bearing Restrictions: Yes LLE Weight Bearing: Non weight  bearing General Chart Reviewed: Yes OT Amount of Missed Time: 46 Minutes PT Missed Treatment Reason: Unavailable (Comment) (HD) Family/Caregiver Present: No Vital  Signs Therapy Vitals Temp: 97.6 F (36.4 C) Pulse Rate: 67 Resp: 17 BP: (!) 127/57 Patient Position (if appropriate): Lying Oxygen Therapy SpO2: 99 % O2 Device: Room Air Pain Pain Assessment Pain Scale: 0-10 Pain Score: 5  Pain Type: Acute pain;Surgical pain Pain Location: Leg Pain Descriptors / Indicators: Aching Pain Frequency: Intermittent Pain Onset: On-going Pain Intervention(s): Medication (See eMAR) Home Living/Prior Functioning Home Living Available Help at Discharge: Family Type of Home: House Home Access: Ramped entrance Home Layout: Two level,Able to live on main level with bedroom/bathroom Bathroom Shower/Tub: Tub/shower unit (pt takes sponge baths) Bathroom Toilet: Standard (pt uses bedside commode) Bathroom Accessibility: Yes Additional Comments: stays in wheelchair in living room, walks with RW with asssist  Lives With: Daughter,Other (Comment) (43 yo grandson, daughter is pt's aide) IADL History Homemaking Responsibilities: No Leisure and Hobbies: enjoys watching TV, playing games on phone Prior Function Level of Independence: Requires assistive device for independence,Needs assistance with ADLs Bath: Minimal Dressing: Minimal  Able to Take Stairs?: No Driving: No Vocation: On disability Comments: daughter takes pt to and from dialysis Vision Baseline Vision/History: Wears glasses Wears Glasses: Reading only Patient Visual Report: No change from baseline Vision Assessment?: No apparent visual deficits Perception  Perception: Within Functional Limits Praxis Praxis: Intact Cognition Overall Cognitive Status: Within Functional Limits for tasks assessed Orientation Level: Person;Place;Situation Person: Oriented Place: Oriented Situation: Oriented Year: 2022 Month: February Day of  Week: Correct Memory: Appears intact Immediate Memory Recall: Sock;Blue;Bed Memory Recall Sock: Without Cue Memory Recall Blue: Without Cue Memory Recall Bed: Without Cue Attention: Sustained Sustained Attention: Appears intact Awareness: Appears intact Problem Solving: Appears intact Safety/Judgment: Appears intact Comments: mild word finding deficits Sensation Sensation Light Touch: Appears Intact Hot/Cold: Appears Intact Proprioception: Impaired by gross assessment (phantom limb pain) Stereognosis: Appears Intact Coordination Gross Motor Movements are Fluid and Coordinated: Yes Fine Motor Movements are Fluid and Coordinated: Yes 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 Motor  Motor Motor: Other (comment) Motor - Skilled Clinical Observations: generalized weakness  Trunk/Postural Assessment  Cervical Assessment Cervical Assessment: Exceptions to [facility] (forward head) Thoracic Assessment Thoracic Assessment: Exceptions to [facility] (rounded shoulders) Lumbar Assessment Lumbar Assessment: Exceptions to [facility] (posterior pelvic tilt) Postural Control Postural Control: Within Functional Limits  Balance Balance Balance Assessed: Yes Static Sitting Balance Static Sitting - Balance Support: Feet supported;Bilateral upper extremity supported Static Sitting - Level of Assistance: 6: Modified independent (Device/Increase time) Dynamic Sitting Balance Dynamic Sitting - Balance Support: Feet supported Dynamic Sitting - Level of Assistance: 5: Stand by assistance Static Standing Balance Static Standing - Balance Support: Bilateral upper extremity supported;During functional activity Static Standing - Level of Assistance: 4: Min assist Dynamic Standing Balance Dynamic Standing - Balance Support: Bilateral upper extremity supported;During functional activity Dynamic Standing - Level of Assistance: 3: Mod assist Extremity/Trunk Assessment RUE Assessment RUE Assessment: Within  Functional Limits Active Range of Motion (AROM) Comments: 3/4 to full shoulder flexion General Strength Comments: 4+/5 shoulder flexion, grip strength LUE Assessment LUE Assessment: Within Functional Limits Active Range of Motion (AROM) Comments: 3/4 to full shoulder flexion General Strength Comments: 4+/5 shoulder flexion, grip strength  Care Tool Care Tool Self Care Eating   Eating Assist Level: Set up assist    Oral Care    Oral Care Assist Level: Set up assist    Bathing   Body parts bathed by patient: Face;Right arm;Chest;Left arm;Abdomen;Left upper leg;Right upper leg Body parts bathed by helper: Front perineal area;Buttocks;Right lower leg Body parts n/a: Left  lower leg Assist Level: Moderate Assistance - Patient 50 - 74%    Upper Body Dressing(including orthotics)   What is the patient wearing?: [place] only   Assist Level: Minimal Assistance - Patient > 75%    Lower Body Dressing (excluding footwear)   What is the patient wearing?: Incontinence brief Assist for lower body dressing: Moderate Assistance - Patient 50 - 74%    Putting on/Taking off footwear   What is the patient wearing?: Non-skid slipper socks Assist for footwear: Total Assistance - Patient < 25%       Care Tool Toileting Toileting activity   Assist for toileting: Moderate Assistance - Patient 50 - 74%     Care Tool Bed Mobility Roll left and right activity   Roll left and right assist level: Supervision/Verbal cueing    Sit to lying activity   Sit to lying assist level: Minimal Assistance - Patient > 75%    Lying to sitting edge of bed activity   Lying to sitting edge of bed assist level: Minimal Assistance - Patient > 75%     Care Tool Transfers Sit to stand transfer   Sit to stand assist level: Moderate Assistance - Patient 50 - 74% (with RW)    Chair/bed transfer   Chair/bed transfer assist level: Moderate Assistance - Patient 50 - 74% (with RW)     Toilet transfer   Assist  Level: Moderate Assistance - Patient 50 - 74%     Care Tool Cognition Expression of Ideas and Wants Expression of Ideas and Wants: Without difficulty (complex and basic) - expresses complex messages without difficulty and with speech that is clear and easy to understand   Understanding Verbal and Non-Verbal Content Understanding Verbal and Non-Verbal Content: Understands (complex and basic) - clear comprehension without cues or repetitions   Memory/Recall Ability *first 3 days only Memory/Recall Ability *first 3 days only: Current season;Location of own room;That he or she is in a hospital/hospital unit    Refer to Care Plan for [place] 1 OT Short Term Goal 1 (Week 1): Pt will complete BSC transfer with CGA + AE PRN OT Short Term Goal 2 (Week 1): Pt will complete standing bimanual ADL  for >2 min with CGA + AE PRN. OT Short Term Goal 3 (Week 1): Pt will don shirt with close S.  Recommendations for other services: Therapeutic Recreation  Pet therapy and Stress management   Skilled Therapeutic Intervention ADL ADL Eating: Set up Where Assessed-Eating: Wheelchair Grooming: Setup Where Assessed-Grooming: Wheelchair Upper Body Bathing: Supervision/safety Where Assessed-Upper Body Bathing: Wheelchair Lower Body Bathing: Moderate assistance Where Assessed-Lower Body Bathing: Wheelchair Upper Body Dressing: Minimal assistance Where Assessed-Upper Body Dressing: Wheelchair Lower Body Dressing: Moderate assistance Where Assessed-Lower Body Dressing: Wheelchair Toileting: Moderate assistance Where Assessed-Toileting: Bedside Commode Toilet Transfer: Moderate assistance Toilet Transfer Method: Stand pivot Science writer: Radiographer, therapeutic: Not assessed Social research officer, government: Not assessed (Does not shower at baseline) Mobility  Bed Mobility Bed Mobility: Rolling Right;Rolling Left;Supine to Sit;Sit to Supine Rolling Right:  Supervision/verbal cueing Rolling Left: Supervision/Verbal cueing Supine to Sit: Supervision/Verbal cueing Sit to Supine: Supervision/Verbal cueing Transfers Sit to Stand: Moderate Assistance - Patient 50-74%  Session Note: Pt received in w/c, agreeable to make up OT eval. Attempted seeing pt earlier at 9 am for scheduled eval, but pt out for HD. Reviewed role of CIR OT, evaluation process, ADL/func mobility retraining, goals for therapy, and safety plan. Evaluation completed  as documented above with session focus on seated bathing, dressing, and functional transfers. Doffed/donned hospital gown min A. Bathed UB close S, LB with overall min A to bathe R foot/buttocks. STS with mod A to power up with RW, requiring 2 attempts 2/2 posterior bias in standing. Doffed/donned R sock with total A. Attempted SP to [facility] towards pt's R side, pt req to sit back in w/c 2/2 fatigue. SP to bed towards R side with overall mod A for balance, pivoting R foot vs hopping. Sitting EOB > supine with close S. Edu re: desensitization techniques to residual limb to reduce phantom limb pain. Pt left semi-reclined, call bell in reach, bed alarm engaged, all immediate needs met.    Discharge Criteria: Patient will be discharged from OT if patient refuses treatment 3 consecutive times without medical reason, if treatment goals not met, if there is a change in medical status, if patient makes no progress towards goals or if patient is discharged from hospital.  The above assessment, treatment plan, treatment alternatives and goals were discussed and mutually agreed upon: by patient  Volanda Napoleon MS, OTR/L  06/17/2020, 3:21 PM

## 2020-06-18 LAB — GLUCOSE, CAPILLARY
Glucose-Capillary: 80 mg/dL (ref 70–99)
Glucose-Capillary: 139 mg/dL — ABNORMAL HIGH (ref 70–99)
Glucose-Capillary: 137 mg/dL — ABNORMAL HIGH (ref 70–99)
Glucose-Capillary: 126 mg/dL — ABNORMAL HIGH (ref 70–99)

## 2020-06-18 MED ORDER — LANTHANUM CARBONATE 500 MG PO CHEW
1000.0000 mg | CHEWABLE_TABLET | Freq: Three times a day (TID) | ORAL | Status: DC
  Administered 2020-06-18 – 2020-06-26 (×22): 1000 mg via ORAL
  Filled 2020-06-18 (×26): qty 2

## 2020-06-18 NOTE — Progress Notes (Signed)
NUTRITION NOTE RD working remotely.   Consult received for diet education for patient on a Renal, Carb Modified diet. Patient noted to have hx of ESRD on HD MWF and hx of type 2 DM.   She has not been seen by a [place] RD at any time in the past. She has been eating 100% of all meals since admission.  Labs reviewed; CBGs 2/19: 116, 141, 127 mg/dl, K: 5.3 mmol/l, Phos: 6.8 mg/dl.   Medications reviewed; 1 tablet renavit/day, 2400 mg renvela TID, 1600 mg renvela PRN.   Will place handouts for DM diet education and low K and low Phos foods in Discharge Instructions.   Will continue to monitor for additional nutrition-related needs.      Jarome Matin, MS, RD, LDN, CNSC Inpatient Clinical Dietitian RD pager # available in [place]  After hours/weekend pager # available in [facility]

## 2020-06-18 NOTE — Progress Notes (Signed)
Patient ID: Christy Zhang, female   DOB: [birth year], 65 y.o.   MRN: [id number]  [facility] Progress Note   Assessment/ Plan:   1.  Critical ischemia/gangrene of left foot: Status post left below-knee amputation on 2/10 by vascular surgery and now admitted to the inpatient rehabilitation unit for continued PT/OT with physiatry support. 2. ESRD: She is usually on a Monday/Wednesday/Friday schedule and was off schedule this past weekend due to high patient/low staff ratio.  Will put on schedule for dialysis again tomorrow. 3. Anemia: With low hemoglobin and hematocrit and without overt loss.  Unclear if received Aranesp on dialysis yesterday-we will verify with pharmacy. 4. CKD-MBD: Significantly elevated phosphorus level with calcium level at goal, will start lanthanum as efficacy of sevelamer might be limited. 5. Nutrition: Continue renal diet with fluid restriction and ongoing renal vitamin.  On ONS. 6. Hypertension: Blood pressure under good control, appears to be close to euvolemic on physical exam.  Subjective:   Denies any acute events overnight, earlier today had physical therapy.   Objective:   BP 127/61 (BP Location: Left Arm)   Pulse 64   Temp 97.8 F (36.6 C)   Resp 15   Ht '5\' 3"'$  (1.6 m)   Wt 108.3 kg   LMP  (LMP Unknown)   SpO2 97%   BMI 42.29 kg/m   Physical Exam: Gen: Comfortably sitting up in wheelchair, left BKA stump propped up. CVS: Pulse regular rhythm, normal rate, S1 and S2 normal Resp: Clear to auscultation bilaterally, no distinct rales or rhonchi Abd: Soft, obese, nontender, bowel sounds normal Ext: Left BKA, right leg without edema.  Right upper arm AV graft with palpable thrill  Labs: BMET Recent Labs  Lab 06/12/20 0405 06/13/20 0305 06/14/20 0451 06/15/20 0201 06/16/20 0133 06/16/20 1815 06/17/20 0727  NA 130* 130* 131* 133* 130* 128* 129*  K 4.8 5.4* 4.9 4.7 4.9 5.3* 5.0  CL 91* 94* 95* 95* 92* 94* 94*  CO2 '25 22 23 26 24  '$ 20* 22  GLUCOSE 72 92 101* 123* 137* 155* 169*  BUN 56* 70* 50* 42* 60* 71* 82*  CREATININE 9.78* 11.29* 8.63* 7.46* 9.37* 10.59* 11.13*  CALCIUM 8.0* 7.9* 8.1* 8.3* 8.2* 8.5* 8.6*  PHOS 5.5* 6.1* 6.1* 5.5* 6.2* 6.8* 7.4*   CBC Recent Labs  Lab 06/14/20 0451 06/15/20 0201 06/16/20 1815 06/17/20 0728  WBC 15.3* 14.7* 12.3* 13.1*  HGB 8.1* 8.3* 8.6* 7.9*  HCT 26.9* 26.9* 27.6* 25.6*  MCV 82.5 82.5 79.8* 81.8  PLT 563* 600* 579* 646*      Medications:    . (feeding supplement) PROSource Plus  30 mL Oral BID BM  . amLODipine  10 mg Oral QHS  . aspirin EC  81 mg Oral QHS  . atorvastatin  40 mg Oral QHS  . Chlorhexidine Gluconate Cloth  6 each Topical Q0600  . clopidogrel  75 mg Oral Daily  . darbepoetin (ARANESP) injection - DIALYSIS  60 mcg Intravenous Q Fri-HD  . doxercalciferol  1.5 mcg Intravenous Q M,W,F-HD  . Dulaglutide  1.5 mg Subcutaneous Weekly  . heparin  5,000 Units Subcutaneous Q8H  . insulin glargine  20 Units Subcutaneous Daily  . metoprolol succinate  50 mg Oral Daily  . multivitamin  1 tablet Oral QHS  . polyethylene glycol  17 g Oral Daily  . Ensure Max Protein  11 oz Oral Daily  . senna-docusate  1 tablet Oral BID  . sevelamer carbonate  2,400 mg Oral  TID WC   Elmarie Shiley, MD 06/18/2020, 9:48 AM

## 2020-06-18 NOTE — Progress Notes (Signed)
PROGRESS NOTE   Subjective/Complaints: No issues overnight Vitals signs stable WBC elevated to 13.1 yesterday Cr up to 11.13 No complaints this morning  ROS: Denies pain, dysuria, SOB  Objective:   No results found. Recent Labs    06/16/20 1815 06/17/20 0728  WBC 12.3* 13.1*  HGB 8.6* 7.9*  HCT 27.6* 25.6*  PLT 579* 646*   Recent Labs    06/16/20 1815 06/17/20 0727  NA 128* 129*  K 5.3* 5.0  CL 94* 94*  CO2 20* 22  GLUCOSE 155* 169*  BUN 71* 82*  CREATININE 10.59* 11.13*  CALCIUM 8.5* 8.6*    Intake/Output Summary (Last 24 hours) at 06/18/2020 0711 Last data filed at 06/17/2020 1715 Gross per 24 hour  Intake 354 ml  Output 3000 ml  Net -2646 ml        Physical Exam: Vital Signs Blood pressure 127/61, pulse 64, temperature 97.8 F (36.6 C), resp. rate 15, height '5\' 3"'$  (1.6 m), weight 108.3 kg, SpO2 97 %. Gen: no distress, normal appearing HEENT: oral mucosa pink and moist, NCAT, Missing numerous teeth Cardio: Reg rate Chest: normal effort, normal rate of breathing Abd: soft, non-distended Ext: no edema Psych: pleasant, normal affect Skin: intact Musculoskeletal:  Cervical back: Normal range of motion.  Comments: L-BKA incision with edema, min serosanguinous drainage on dressing and sutures intact. Right 2nd toe-- with dry gangrene and in process of being auto-amputated medially.  Skin: General: Skin is warm.  Comments: Chronic changes in RLE, right second toe gangrenous, proximal portion of toe remains intact. Skin very dry in foot/leg. Neurological:  Mental Status: She is alertand oriented to person, place, and time.  Comments: Alert and oriented x 3. Normal insight and awareness. Intact Memory. Normal language and speech. Cranial nerve exam unremarkable. UE motor 4+ to 5/5. RLE: 3+/5 HF, 4/5 KE and 3-4/5 ADF/PF. LLE: able to lift leg off bed about 6". Decreased LT RLE  distally. Psychiatric:  Mood and Affect: Moodnormal.  Behavior: Behaviornormal.   Assessment/Plan: 1. Functional deficits which require 3+ hours per day of interdisciplinary therapy in a comprehensive inpatient rehab setting.  Physiatrist is providing close team supervision and 24 hour management of active medical problems listed below.  Physiatrist and rehab team continue to assess barriers to discharge/monitor patient progress toward functional and medical goals  Care Tool:  Bathing    Body parts bathed by patient: Face,Right arm,Chest,Left arm,Abdomen,Left upper leg,Right upper leg   Body parts bathed by helper: Front perineal area,Buttocks,Right lower leg Body parts n/a: Left lower leg   Bathing assist Assist Level: Moderate Assistance - Patient 50 - 74%     Upper Body Dressing/Undressing Upper body dressing   What is the patient wearing?: Hospital gown only    Upper body assist Assist Level: Minimal Assistance - Patient > 75%    Lower Body Dressing/Undressing Lower body dressing      What is the patient wearing?: Incontinence brief     Lower body assist Assist for lower body dressing: Moderate Assistance - Patient 50 - 74%     Toileting Toileting    Toileting assist Assist for toileting: Moderate Assistance - Patient 50 - 74%  Transfers Chair/bed transfer  Transfers assist     Chair/bed transfer assist level: Moderate Assistance - Patient 50 - 74% (with RW)     Locomotion Ambulation   Ambulation assist   Ambulation activity did not occur: Safety/medical concerns          Walk 10 feet activity   Assist  Walk 10 feet activity did not occur: Safety/medical concerns        Walk 50 feet activity   Assist Walk 50 feet with 2 turns activity did not occur: Safety/medical concerns         Walk 150 feet activity   Assist Walk 150 feet activity did not occur: Safety/medical concerns         Walk 10 feet on  uneven surface  activity   Assist Walk 10 feet on uneven surfaces activity did not occur: Safety/medical concerns         Wheelchair     Assist   Type of Wheelchair: Manual    Wheelchair assist level: Minimal Assistance - Patient > 75% Max wheelchair distance: 150    Wheelchair 50 feet with 2 turns activity    Assist        Assist Level: Minimal Assistance - Patient > 75%   Wheelchair 150 feet activity     Assist      Assist Level: Minimal Assistance - Patient > 75%   Blood pressure 127/61, pulse 64, temperature 97.8 F (36.6 C), resp. rate 15, height '5\' 3"'$  (1.6 m), weight 108.3 kg, SpO2 97 %.  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 -patient may showerif residual limb is water-proofed -ELOS/Goals: 7-10 days, Mod I at w/c level  -Continue CIR 2. Antithrombotics: -DVT/anticoagulation:Pharmaceutical:Continue Heparin -antiplatelet therapy: ASA/Plavix 3. Pain Management:ContinueOxycodone prn--received 2 doses 2/20 4. Mood:LCSW to follow for evaluation and support. -antipsychotic agents: N/A 5. Neuropsych: This patientiscapable of making decisions on herown behalf. 6. Skin/Wound Care:Protein supplement for hypoalbuminemia  --monitor wound for healing.  -apply dry dressing and ACE to residual limb -apply small amount of gauze to protect gangrenous second toe which is about to auto-amputate  7. Fluids/Electrolytes/Nutrition:Strict I/O. Labs with HD. 1200cc/FR. 8. ESRD: Schedule HD at the end of the day on MWF to help with tolerance of therapy.  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   -Cr increased to 11.13 on 2/19- appreciate management by nephrology 9. T2DM with nephropathy: Hgb A1c- 6.4 on last check. Was on Lantus [id number]  units with Trulicity 1.5 mg/wk.  --Monitor BS ac/hs and use SSI for elevated BS --Continue to titrate Lantus to home dose as indicated .  10 HTN: Monitor BP tid. Has been labile --Continue Metoprolol and amlodipine. 11. PVD: Continue Plavix, ASA and Lipitor 12. Morbid obesity: BMI 39.01. --educate on diet as well as importance of weight loss to help promote health and mobility.  13. Anemia of chronic disease: Being managed with weekly Aranesp.  14. Thrombocytosis: Continue to trend--improved form peak of 774-->600 15. Leukocytosis: discussed with patient that WBC is elevated. She is asymptomatic and without fever, repeat with next dialysis labs to trend  LOS: 2 days A FACE TO FACE EVALUATION WAS PERFORMED  Martha Clan P Duell Holdren 06/18/2020, 7:11 AM

## 2020-06-18 NOTE — Progress Notes (Signed)
Physical Therapy Session Note  Patient Details  Name: Christy Zhang MRN: [id number] Date of Birth: Jan 31, 1956  Today's Date: 06/18/2020 PT Individual Time: 0800-0900 PT Individual Time Calculation (min): 60 min   Short Term Goals: Week 1:  PT Short Term Goal 1 (Week 1): STG=LTG due to ELOS  Skilled Therapeutic Interventions/Progress Updates:    Pt received seated in bed, agreeable to PT session. No complaints of pain. Supine to sitting EOB with use of bedrail and HOB elevated at Supervision level. Assisted pt with donning pants while seated EOB, pt is mod A for donning pants. Sit to stand with min A to RW, pt requires assist to pull pants up over hips. Stand pivot transfer bed to w/c with mod A needed and pt unable to fully turn her body to back up w/c with transfer, requires assist to pull hips over towards chair in order to sit safely. Pt reports difficulty scooting her RLE with non-skin socks on during transfer. Education with patient regarding maintaining skin integrity of her intact limb and wanting to work towards clearing RLE during transfers vs scooting it on the floor. Also advised pt have family bring in tennis shoes to wear during standing and transfers to protect skin as well. Manual w/c propulsion 2 x 100 ft with use of BUE at Supervision level, increased time needed to complete. Sit to stand in // bars with CGA. Focus on "hopping" in place with use of // bars and min A, pt unable to clear ground with RLE but exhibits improved technique with use of BUE extension and bending RLE in attempt to hop in place. Car transfer with min A with use of RW. Pt exhibits improved ability to perform stand pivot transfers with use of RW and improved LE clearance during transfers vs just scooting RLE on the floor. Pt left seated in w/c in room with needs in reach, quick release belt and chair alarm in place at end of session.  Therapy Documentation Precautions:  Precautions Precautions:  Fall Precaution Comments: falls, R 2nd toe wound Required Braces or Orthoses: Knee Immobilizer - Left Knee Immobilizer - Left: On at all times Restrictions Weight Bearing Restrictions: Yes LLE Weight Bearing: Non weight bearing    Therapy/Group: Individual Therapy    Excell Seltzer, PT, DPT  06/18/2020, 12:00 PM

## 2020-06-19 ENCOUNTER — Encounter (HOSPITAL_COMMUNITY): Payer: Self-pay | Admitting: Physical Medicine and Rehabilitation

## 2020-06-19 LAB — GLUCOSE, CAPILLARY
Glucose-Capillary: 159 mg/dL — ABNORMAL HIGH (ref 70–99)
Glucose-Capillary: 118 mg/dL — ABNORMAL HIGH (ref 70–99)
Glucose-Capillary: 133 mg/dL — ABNORMAL HIGH (ref 70–99)
Glucose-Capillary: 122 mg/dL — ABNORMAL HIGH (ref 70–99)

## 2020-06-19 LAB — CBC
HCT: 24.1 % — ABNORMAL LOW (ref 36.0–46.0)
Hemoglobin: 7.6 g/dL — ABNORMAL LOW (ref 12.0–15.0)
MCH: 25.6 pg — ABNORMAL LOW (ref 26.0–34.0)
MCHC: 31.5 g/dL (ref 30.0–36.0)
MCV: 81.1 fL (ref 80.0–100.0)
Platelets: 646 10*3/uL — ABNORMAL HIGH (ref 150–400)
RBC: 2.97 MIL/uL — ABNORMAL LOW (ref 3.87–5.11)
RDW: 16.8 % — ABNORMAL HIGH (ref 11.5–15.5)
WBC: 15 10*3/uL — ABNORMAL HIGH (ref 4.0–10.5)
nRBC: 0 % (ref 0.0–0.2)

## 2020-06-19 LAB — RENAL FUNCTION PANEL
Albumin: 2.4 g/dL — ABNORMAL LOW (ref 3.5–5.0)
Anion gap: 13 (ref 5–15)
BUN: 74 mg/dL — ABNORMAL HIGH (ref 8–23)
CO2: 22 mmol/L (ref 22–32)
Calcium: 8.4 mg/dL — ABNORMAL LOW (ref 8.9–10.3)
Chloride: 91 mmol/L — ABNORMAL LOW (ref 98–111)
Creatinine, Ser: 10.15 mg/dL — ABNORMAL HIGH (ref 0.44–1.00)
GFR, Estimated: 4 mL/min — ABNORMAL LOW (ref >60)
Glucose, Bld: 136 mg/dL — ABNORMAL HIGH (ref 70–99)
Phosphorus: 6.4 mg/dL — ABNORMAL HIGH (ref 2.5–4.6)
Potassium: 5.6 mmol/L — ABNORMAL HIGH (ref 3.5–5.1)
Sodium: 126 mmol/L — ABNORMAL LOW (ref 135–145)

## 2020-06-19 MED ORDER — GABAPENTIN 300 MG PO CAPS
300.0000 mg | ORAL_CAPSULE | Freq: Every day | ORAL | Status: DC
  Administered 2020-06-19 – 2020-07-03 (×15): 300 mg via ORAL
  Filled 2020-06-19 (×15): qty 1

## 2020-06-19 MED ORDER — GABAPENTIN 600 MG PO TABS
300.0000 mg | ORAL_TABLET | Freq: Every day | ORAL | Status: DC
  Filled 2020-06-19: qty 0.5

## 2020-06-19 MED ORDER — DOXERCALCIFEROL 4 MCG/2ML IV SOLN
INTRAVENOUS | Status: AC
  Filled 2020-06-19: qty 2

## 2020-06-19 MED ORDER — HEPARIN SODIUM (PORCINE) 1000 UNIT/ML DIALYSIS
40.0000 [IU]/kg | INTRAMUSCULAR | Status: DC | PRN
  Filled 2020-06-19: qty 5

## 2020-06-19 NOTE — IPOC Note (Signed)
Overall Plan of Care [facility]) Patient Details Name: Christy Zhang MRN: [id number] DOB: 1956-04-21  Admitting Diagnosis: Below-knee amputation of left lower extremity [facility])  Hospital Problems: Principal Problem:   Below-knee amputation of left lower extremity ([place])     Functional Problem List: Nursing Bowel,Endurance,Motor,Pain,Safety,Skin Integrity,Medication Management,Nutrition  PT Balance,Edema,Endurance,Pain,Sensory,Skin Integrity  OT Balance,Skin Integrity,Endurance,Motor,Pain  SLP    TR         Basic ADL's: OT Eating,Grooming,Bathing,Dressing,Toileting     Advanced  ADL's: OT       Transfers: PT Bed Mobility,Bed to [place]  OT Toilet     Locomotion: PT Ambulation,Wheelchair Mobility,Stairs     Additional Impairments: OT    SLP        TR      Anticipated Outcomes Item Anticipated Outcome  Self Feeding mod I  Swallowing      Basic self-care  min A  Toileting  close S   Bathroom Transfers min A  Bowel/Bladder  Manage bowel and bladder with mod I assist. remain continent.  Transfers  Supervision assist with LRAD  Locomotion  WC level without cues or assist  Communication     Cognition     Pain  pain level less than 4 on scale of 0-10  Safety/Judgment  remain free of injury, prevent falls with cues and reminders   Therapy Plan: PT Intensity: Minimum of 1-2 x/day ,45 to 90 minutes PT Frequency: 5 out of 7 days PT Duration Estimated Length of Stay: 9-12 days OT Intensity: Minimum of 1-2 x/day, 45 to 90 minutes OT Frequency: 5 out of 7 days OT Duration/Estimated Length of Stay: 13-15 days     Due to the current state of emergency, patients may not be receiving their 3-hours of Medicare-mandated therapy.   Team Interventions: Nursing Interventions Patient/Family Education,Pain Management,Discharge Planning,Bowel Management,Medication Management,Skin Care/Wound Management,Psychosocial Support,Disease Management/Prevention   PT interventions Ambulation/gait training,Cognitive remediation/compensation,Community reintegration,Balance/vestibular training,Discharge planning,Disease management/prevention,DME/adaptive equipment instruction,Functional mobility training,Neuromuscular re-education,Psychosocial support,Patient/family education,Pain management,Skin care/wound management,Stair training,Therapeutic Exercise,UE/LE Strength taining/ROM,UE/LE Coordination activities,Visual/perceptual remediation/compensation,Wheelchair propulsion/positioning,Splinting/orthotics,Therapeutic Activities  OT Interventions Balance/vestibular training,Disease mangement/prevention,Neuromuscular re-education,Self Care/advanced ADL retraining,Therapeutic Exercise,Wheelchair propulsion/positioning,Pain management,DME/adaptive equipment instruction,Skin care/wound managment,UE/LE Strength taining/ROM,UE/LE Coordination activities,Patient/family education,Community reintegration,Discharge planning,Functional mobility training,Psychosocial support,Therapeutic Activities  SLP Interventions    TR Interventions    SW/CM Interventions Discharge Planning,Psychosocial Support,Patient/Family Education   Barriers to Discharge MD  Medical stability  Nursing Inaccessible home environment,Decreased caregiver support,Home environment access/layout,Wound Care,Lack of/limited family support,Weight bearing restrictions,Medication compliance,Behavior    PT Wound Care,Weight bearing restrictions,Medication compliance    OT      SLP      SW       Team Discharge Planning: Destination: PT-Home ,OT- Home , SLP-  Projected Follow-up: PT-Home health PT, OT-  Home health OT, SLP-  Projected Equipment Needs: PT-To be determined, OT- None recommended by OT (Pt has req DME), SLP-  Equipment Details: PT- , OT-  Patient/family involved in discharge planning: PT- Patient,  OT-Patient, SLP-   MD ELOS: 10-14 days Medical Rehab Prognosis:  Excellent Assessment: The  patient has been admitted for CIR therapies with the diagnosis of left BKA. The team will be addressing functional mobility, strength, stamina, balance, safety, adaptive techniques and equipment, self-care, bowel and bladder mgt, patient and caregiver education, pain mgt, pre-prosthetic education, wound care, community reentry. Goals have been set at supervision to min assist for mobility and self-care.   Due to the current state of emergency, patients may not be receiving their 3 hours per day of Medicare-mandated therapy.  Meredith Staggers, MD, FAAPMR      See Team Conference Notes for weekly updates to the plan of care

## 2020-06-19 NOTE — Progress Notes (Signed)
Inpatient Rehabilitation Care Coordinator Assessment and Plan Patient Details  Name: Christy Zhang MRN: [id number] Date of Birth: 09-Oct-1955  Today's Date: 06/19/2020  Hospital Problems: Principal Problem:   Below-knee amputation of left lower extremity [facility])  Past Medical History:  Past Medical History:  Diagnosis Date  . Anemia   . Arthritis   . Asthma    in the past  . CHF (congestive heart failure) ([place])   . Chronic kidney disease    Renal failure, dialysis on M/W/F  . 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 End stage renal disease ([place])   . End stage renal disease on dialysis ([place])   . Hemodialysis access, arteriovenous graft ([place])   . Hyperlipidemia   . Hypertension   . Neuromuscular disorder [facility])    Past Surgical History:  Past Surgical History:  Procedure Laterality Date  .  LEFT BELOW KNEE AMPUTATION (Left Knee)  06/08/2020  . A/V FISTULAGRAM Right 02/04/2017   Procedure: A/V Fistulagram;  Surgeon: Waynetta Sandy, MD;  Location: [place] CV LAB;  Service: Cardiovascular;  Laterality: Right;  . ABDOMINAL AORTOGRAM W/LOWER EXTREMITY N/A 11/18/2019   Procedure: ABDOMINAL AORTOGRAM W/LOWER EXTREMITY;  Surgeon: [name], MD;  Location: [place] CV LAB;  Service: Cardiovascular;  Laterality: N/A;  . ABDOMINAL AORTOGRAM W/LOWER EXTREMITY N/A 02/23/2020   Procedure: ABDOMINAL AORTOGRAM W/LOWER EXTREMITY;  Surgeon: [name], MD;  Location: [place] CV LAB;  Service: Cardiovascular;  Laterality: N/A;  Bilateral  . AMPUTATION Left 06/08/2020   Procedure: LEFT BELOW KNEE AMPUTATION;  Surgeon: Cherre Robins, MD;  Location: [place];  Service: Vascular;  Laterality: Left;  . AV FISTULA PLACEMENT Right 08/25/2014   Procedure: RIGHT BRACHIOCEPHALIC ARTERIOVENOUS (AV) FISTULA CREATION;  Surgeon: Mal Misty, MD;  Location: [place];  Service: Vascular;  Laterality: Right;  . AV FISTULA PLACEMENT Right 03/18/2019    Procedure: INSERTION OF ARTERIOVENOUS (AV) GORE-TEX GRAFT RIGHT ARM;  Surgeon: Serafina Mitchell, MD;  Location: [place];  Service: Vascular;  Laterality: Right;  . BASCILIC VEIN TRANSPOSITION Right 12/24/2018   Procedure: FIRST STAGE BASILIC VEIN TRANSPOSITION RIGHT UPPER ARM;  Surgeon: Serafina Mitchell, MD;  Location: [place];  Service: Vascular;  Laterality: Right;  . FISTULA SUPERFICIALIZATION Right 11/03/2014   Procedure: RIGHT UPPER ARM FISTULA SUPERFICIALIZATION;  Surgeon: Mal Misty, MD;  Location: [place];  Service: Vascular;  Laterality: Right;  . FISTULOGRAM Right 01/16/2016   Procedure: RIGHT UPPER EXTREMITY ARTERIOVENOUS FISTULOGRAM WITH BALLOON ANGIOPLASTY;  Surgeon: Vickie Epley, MD;  Location: [facility];  Service: Vascular;  Laterality: Right;  . INSERTION OF DIALYSIS CATHETER     X4  . IR FLUORO GUIDE CV LINE RIGHT  12/03/2018  . IR REMOVAL TUN CV CATH W/O FL  07/06/2019  . IR THROMBECTOMY AV FISTULA W/THROMBOLYSIS/PTA INC/SHUNT/IMG RIGHT Right 12/03/2018  . IR THROMBECTOMY AV FISTULA W/THROMBOLYSIS/PTA/STENT INC/SHUNT/IMG RT Right 10/11/2019  . IR US GUIDE VASC ACCESS RIGHT  12/03/2018  . IR US GUIDE VASC ACCESS RIGHT  12/03/2018  . IR US GUIDE VASC ACCESS RIGHT  10/14/2019  . LIGATION OF COMPETING BRANCHES OF ARTERIOVENOUS FISTULA Right 11/03/2014   Procedure: LIGATION OF COMPETING BRANCHE x1 OF RIGHT UPPER ARM ARTERIOVENOUS FISTULA;  Surgeon: Mal Misty, MD;  Location: [place];  Service: Vascular;  Laterality: Right;  . PERIPHERAL VASCULAR BALLOON ANGIOPLASTY Right 02/04/2017   Procedure: PERIPHERAL VASCULAR BALLOON ANGIOPLASTY;  Surgeon: Waynetta Sandy, MD;  Location: [facility]  INVASIVE CV LAB;  Service: Cardiovascular;  Laterality: Right;  . PERIPHERAL VASCULAR BALLOON ANGIOPLASTY Right 11/18/2019   Procedure: PERIPHERAL VASCULAR BALLOON ANGIOPLASTY;  Surgeon: [name], MD;  Location: [place] CV LAB;  Service: Cardiovascular;  Laterality: Right;  anterior tibial  .  PERIPHERAL VASCULAR BALLOON ANGIOPLASTY  02/23/2020   Procedure: PERIPHERAL VASCULAR BALLOON ANGIOPLASTY;  Surgeon: [name], MD;  Location: [place] CV LAB;  Service: Cardiovascular;;  Lt  AT  . TRANSMETATARSAL AMPUTATION Left 02/24/2020   Procedure: TRANSMETATARSAL AMPUTATION LEFT;  Surgeon: Cherre Robins, MD;  Location: [place];  Service: Vascular;  Laterality: Left;  . TUBAL LIGATION     Social History:  reports that she quit smoking about 35 years ago. Her smoking use included cigarettes. She has never used smokeless tobacco. She reports that she does not drink alcohol and does not use drugs.  Family / Support Systems Marital Status: Widow/Widower Patient Roles: Parent,Other (Comment) (Dialysis Pt) Children: [name]-Marcia- [place]-daughter [phone] Other Supports: Another daughter who lives down the road from her and daughter Anticipated Caregiver: Marcia-daughter Ability/Limitations of Caregiver: No limitations- daughter is pt's Physiological scientist 30 hours per week Caregiver Availability: 24/7 Family Dynamics: Close knit with daughter's, [name] lives with her and her two children-31 & 16 yo. Pt feels she has good supports via family, friends and church members  Social History Preferred language: English Religion: None Cultural Background: No issues Education: HS Read: Yes Write: Yes Employment Status: Retired Public relations account executive Issues: No issues Guardian/Conservator: None-according to MD pt is capable of making her own decisions while here   Abuse/Neglect Abuse/Neglect Assessment Can Be Completed: Yes Physical Abuse: Denies Verbal Abuse: Denies Sexual Abuse: Denies Exploitation of patient/patient's resources: Denies Self-Neglect: Denies  Emotional Status Pt's affect, behavior and adjustment status: Pt is motivated to do well and regain her independence while here. She wants to know how ling she will be here. Gave her estimated LOS and SOS so aware of  therapy plan while here Recent Psychosocial Issues: other health issues had to put off surgery due to COVID and pt's not wanting leg to be amputated Psychiatric History: No history deferred depression screen due to coping appropriately and able to verbalize her feelings and concerns. Substance Abuse History: No issues  Patient / Family Perceptions, Expectations & Goals Pt/Family understanding of illness & functional limitations: Pt and daughter can explain her amputation and treatment plan going forward. She does talk with the MD and feels she has a good understanding of moving forward. 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) Premorbid Home Care/DME Agencies: Other (Comment) (has wc, rw, bsc, tub bench) Transportation available at discharge: daughter takes to appointments  Discharge Planning Living Arrangements: Children,Other relatives Support Systems: Engineer, manufacturing relatives,Friends/neighbors,Church/faith community Type of Residence: Private residence Insurance Resources: [employer] (specify county) Pensions consultant: Therapist, art Screen Referred: No Living Expenses: Education officer, community Management: Patient,Family Does the patient have any problems obtaining your medications?: No Home Management: Daughter does this Patient/Family Preliminary Plans: Return home with daughter assisting with her care like she did prior to admission, she may need to assist more due to amputation. Made  aware of team conference goals and ELOS. She was act[facility] with [facility]  prior to admission will make sure aware here and when needed again at discharge. Care Coordinator Anticipated Follow Up Needs: HH/OP  Clinical Impression Pleasant female who is willing to work hard in therapies to regain her independence and at least be wheelchair level. Her daughter whom lives with her assists her and is her Physiological scientist. She transports her to HD also. Will work on discharge needs. Pt is coping appropriately with loss of her leg.  Elease Hashimoto 06/19/2020, 10:48 AM

## 2020-06-19 NOTE — Progress Notes (Signed)
Inpatient Rehabilitation  Patient information reviewed and entered into eRehab system by Jahmya Onofrio M. Keyundra Fant, M.A., CCC/SLP, PPS Coordinator.  Information including medical coding, functional ability and quality indicators will be reviewed and updated through discharge.    

## 2020-06-19 NOTE — Progress Notes (Signed)
[facility] Progress Note   Subjective:    Feels well this am. No complaints. For dialysis today.    Objective Vitals:   06/18/20 1409 06/18/20 1924 06/19/20 0433 06/19/20 0808  BP: (!) 128/57 (!) 161/71 (!) 146/64 (!) 144/66  Pulse: 67 74 72 74  Resp: '18 18 17   '$ Temp: 97.7 F (36.5 C) 97.8 F (36.6 C) 98.2 F (36.8 C)   TempSrc: Oral  Oral   SpO2: 99% 99% 94%   Weight:   104.1 kg   Height:       Physical Exam General:Well developed female in NAD Heart:RRR, no mrg Lungs:CTAB, nml WOB Abdomen:soft, NTND Extremities:L BKA, no edema b/l Dialysis Access: RU AVG +b   Filed Weights   06/17/20 0710 06/18/20 0500 06/19/20 0433  Weight: 104 kg 108.3 kg 104.1 kg    Intake/Output Summary (Last 24 hours) at 06/19/2020 1004 Last data filed at 06/19/2020 0746 Gross per 24 hour  Intake 474 ml  Output -  Net 474 ml    Additional Objective Labs: Basic Metabolic Panel: Recent Labs  Lab 06/16/20 0133 06/16/20 1815 06/17/20 0727  NA 130* 128* 129*  K 4.9 5.3* 5.0  CL 92* 94* 94*  CO2 24 20* 22  GLUCOSE 137* 155* 169*  BUN 60* 71* 82*  CREATININE 9.37* 10.59* 11.13*  CALCIUM 8.2* 8.5* 8.6*  PHOS 6.2* 6.8* 7.4*   Liver Function Tests: Recent Labs  Lab 06/16/20 0133 06/16/20 1815 06/17/20 0727  ALBUMIN 2.2* 2.3* 2.2*   CBC: Recent Labs  Lab 06/13/20 0305 06/14/20 0451 06/15/20 0201 06/16/20 1815 06/17/20 0728  WBC 15.5* 15.3* 14.7* 12.3* 13.1*  HGB 8.1* 8.1* 8.3* 8.6* 7.9*  HCT 26.1* 26.9* 26.9* 27.6* 25.6*  MCV 80.3 82.5 82.5 79.8* 81.8  PLT 598* 563* 600* 579* 646*   CBG: Recent Labs  Lab 06/18/20 0609 06/18/20 1131 06/18/20 1621 06/18/20 2053 06/19/20 0608  GLUCAP 80 139* 137* 126* 118*    Medications:  . (feeding supplement) PROSource Plus  30 mL Oral BID BM  . amLODipine  10 mg Oral QHS  . aspirin EC  81 mg Oral QHS  . atorvastatin  40 mg Oral QHS  . Chlorhexidine Gluconate Cloth  6 each Topical Q0600  . clopidogrel  75  mg Oral Daily  . darbepoetin (ARANESP) injection - DIALYSIS  60 mcg Intravenous Q Fri-HD  . doxercalciferol  1.5 mcg Intravenous Q M,W,F-HD  . Dulaglutide  1.5 mg Subcutaneous Weekly  . heparin  5,000 Units Subcutaneous Q8H  . insulin glargine  20 Units Subcutaneous Daily  . lanthanum  1,000 mg Oral TID WC  . metoprolol succinate  50 mg Oral Daily  . multivitamin  1 tablet Oral QHS  . polyethylene glycol  17 g Oral Daily  . Ensure Max Protein  11 oz Oral Daily  . senna-docusate  1 tablet Oral BID  . sevelamer carbonate  2,400 mg Oral TID WC    Dialysis Orders: [facility] MWF 4hrs 2K 2.5Ca EDW 101kg RU AVG Heparin 1000 load and 1000 units qhr EPO 14000 units qHD Venofer '100mg'$  qHD Hectorol 1.98mg qHD    Assessment/Plan: 1. 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 Now in CIR  2. Leukocytosis - improving.  BC negative 3. ESRD- on MWF. Back on schedule. HD today 2/21.  4. HTN/ volume -BP remains mildly elevated. Continue amlodipine and metoprolol.Does not appear grossly volume overloaded.  UF as tolerated  5. DM2 -  peradmitting team.  6. Anemia ckd -Hgb trending down.  Got Aranesp 60 with HD 2/19. Check Fe studies.  7. MBD ckd -Calcium ok. Phos up. Continue renvela and VDRA.  8. Nutrition - Renal diet with fluid restrictions. Protein supplements, Vitamins.   Lynnda Child PA-C [facility] 06/19/2020,10:04 AM

## 2020-06-19 NOTE — Progress Notes (Signed)
Occupational Therapy Session Note  Patient Details  Name: Christy Zhang MRN: [id number] Date of Birth: [birth year]  Today's Date: 06/19/2020 OT Individual Time: [id number] and [id number] OT Individual Time Calculation (min): 56 min and 57 min  Short Term Goals: Week 1:  OT Short Term Goal 1 (Week 1): Pt will complete BSC transfer with CGA + AE PRN OT Short Term Goal 2 (Week 1): Pt will complete standing bimanual ADL  for >2 min with CGA + AE PRN. OT Short Term Goal 3 (Week 1): Pt will don shirt with close S.  Skilled Therapeutic Interventions/Progress Updates:    Pt greeted in bed wearing KI, reporting that she didn't get much sleep last night due to residual limb pain. RN in during session to provide PRN pain medicine. She was agreeable to participate in ADLs while sitting EOB. OT taught her lateral lean techniques for meeting demands of LB self care. Min A for UB self care including thorough drying and powder application under belly folds. Found area of bleeding under Lt belly fold, notified RN. Setup for oral care/grooming tasks. At end of session pt opted to return to bed, and returned to supine unassisted. Left her with all needs within reach and bed alarm set. Tx focus placed on adaptive self care skills.   2nd Session 1:1 tx (57 min) Pt greeted in bed and premedicated for residual limb/phantom pain. She was agreeable to engage in tx, started with AE training to increase her functional independence with LB self care. Blocked practice threading LEs into pull ups using the reacher + also donning gripper socks using the sock aide, 5 trials with each device. With repetition, pt able to to thread LEs into pull ups and doff/don gripper socks with setup assist. She returned to bed and was able to obtain prone position given Min A. Pt able to tolerate prone and then prone on elbows transition for a few minutes to stretch the Lt hip, then she reported stomach discomfort. She felt ok in sidelying  position so worked on active Lt hip extension and hip abduction against gravity. While sitting EOB again, pt participated in [name] exercises with mirror placed at midline to apply mirror therapy principles for phantom pain mgt. Education provided on using mirror therapy principles at home when phantom pain increased. Pt appreciative. She returned to bed and was left with all needs within reach and bed alarm set.   Therapy Documentation Precautions:  Precautions Precautions: Fall Precaution Comments: falls, R 2nd toe wound Required Braces or Orthoses: Knee Immobilizer - Left Knee Immobilizer - Left: On at all times Restrictions Weight Bearing Restrictions: Yes LLE Weight Bearing: Non weight bearing ADL: ADL Eating: Set up Where Assessed-Eating: Wheelchair Grooming: Setup Where Assessed-Grooming: Wheelchair Upper Body Bathing: Supervision/safety Where Assessed-Upper Body Bathing: Wheelchair Lower Body Bathing: Moderate assistance Where Assessed-Lower Body Bathing: Wheelchair Upper Body Dressing: Minimal assistance Where Assessed-Upper Body Dressing: Wheelchair Lower Body Dressing: Moderate assistance Where Assessed-Lower Body Dressing: Wheelchair Toileting: Moderate assistance Where Assessed-Toileting: Bedside Commode Toilet Transfer: Moderate assistance Toilet Transfer Method: Stand pivot Toilet Transfer Equipment: Radiographer, therapeutic: Not assessed Social research officer, government: Not assessed (Does not shower at baseline)      Therapy/Group: Individual Therapy  Christy Zhang 06/19/2020, 12:31 PM

## 2020-06-19 NOTE — Progress Notes (Signed)
[facility] Individual Statement of Services  Patient Name:  Christy Zhang  Date:  06/19/2020  Welcome to the [facility].  Our goal is to provide you with an individualized program based on your diagnosis and situation, designed to meet your specific needs.  With this comprehensive rehabilitation program, you will be expected to participate in at least 3 hours of rehabilitation therapies Monday-Friday, with modified therapy programming on the weekends.  Your rehabilitation program will include the following services:  Physical Therapy (PT), Occupational Therapy (OT), 24 hour per day rehabilitation nursing, Neuropsychology, Care Coordinator, Rehabilitation Medicine, Nutrition Services and Pharmacy Services  Weekly team conferences will be held on Tuesday to discuss your progress.  Your Inpatient Rehabilitation Care Coordinator will talk with you frequently to get your input and to update you on team discussions.  Team conferences with you and your family in attendance may also be held.  Expected length of stay: 12-14 days  Overall anticipated outcome: Supervision-independent with cues  Depending on your progress and recovery, your program may change. Your Inpatient Rehabilitation Care Coordinator will coordinate services and will keep you informed of any changes. Your Inpatient Rehabilitation Care Coordinator's name and contact numbers are listed  below.  The following services may also be recommended but are not provided by the [facility]:    [place] will be made to provide these services after discharge if needed.  Arrangements include referral to agencies that provide these services.  Your insurance has been verified to be:  Medicare & medicaid Your primary doctor is:  Claretta Fraise  Pertinent information will be shared with your doctor and  your insurance company.  Inpatient Rehabilitation Care Coordinator:  Ovidio Kin, Merna or Emilia Beck  Information discussed with and copy given to patient by: Elease Hashimoto, 06/19/2020, 9:48 AM

## 2020-06-19 NOTE — Progress Notes (Signed)
PROGRESS NOTE   Subjective/Complaints:  Pt reports LLE was hurting last night- first time in awhile- was actually her toes, and bototm of foot- phantom pain.  On HD LBM yesterday.     ROS:  Pt denies SOB, abd pain, CP, N/V/C/D, and vision changes   Objective:   No results found. Recent Labs    06/17/20 0728 06/19/20 1413  WBC 13.1* 15.0*  HGB 7.9* 7.6*  HCT 25.6* 24.1*  PLT 646* 646*   Recent Labs    06/17/20 0727 06/19/20 1413  NA 129* 126*  K 5.0 5.6*  CL 94* 91*  CO2 22 22  GLUCOSE 169* 136*  BUN 82* 74*  CREATININE 11.13* 10.15*  CALCIUM 8.6* 8.4*    Intake/Output Summary (Last 24 hours) at 06/19/2020 2010 Last data filed at 06/19/2020 1721 Gross per 24 hour  Intake 360 ml  Output 2000 ml  Net -1640 ml        Physical Exam: Vital Signs Blood pressure 135/60, pulse 61, temperature 98.2 F (36.8 C), temperature source Oral, resp. rate 16, height '5\' 3"'$  (1.6 m), weight 102.3 kg, SpO2 100 %. Gen: laying supine in bed; appropriate, NAD HEENT: oral mucosa pink and moist, NCAT, Missing numerous teeth- conjugate gaze Cardio: borderline bradycardia Chest: CTA B/L- no W/R/R- good air movement Abd: Soft, NT, ND, (+)BS  Ext: no edema Psych: appropriate Skin: intact Musculoskeletal:  Cervical back: Normal range of motion.  Comments: L-BKA incision with edema, min serosanguinous drainage on dressing and sutures intact. Right 2nd toe-- with dry gangrene and in process of being auto-amputated medially.  Skin: General: Skin is warm.  Comments: Chronic changes in RLE, right second toe gangrenous, proximal portion of toe remains intact. Skin very dry in foot/leg. Neurological: Ox3 Comments: Alert and oriented x 3. Normal insight and awareness. Intact Memory. Normal language and speech. Cranial nerve exam unremarkable. UE motor 4+ to 5/5. RLE: 3+/5 HF, 4/5 KE and 3-4/5 ADF/PF. LLE: able to  lift leg off bed about 6". Decreased LT RLE distally.   Assessment/Plan: 1. Functional deficits which require 3+ hours per day of interdisciplinary therapy in a comprehensive inpatient rehab setting.  Physiatrist is providing close team supervision and 24 hour management of active medical problems listed below.  Physiatrist and rehab team continue to assess barriers to discharge/monitor patient progress toward functional and medical goals  Care Tool:  Bathing    Body parts bathed by patient: Face,Right arm,Chest,Left arm,Abdomen,Left upper leg,Right upper leg,Front perineal area   Body parts bathed by helper: Buttocks,Right lower leg Body parts n/a: Left lower leg   Bathing assist Assist Level: Minimal Assistance - Patient > 75%     Upper Body Dressing/Undressing Upper body dressing   What is the patient wearing?: Hospital gown only    Upper body assist Assist Level: Minimal Assistance - Patient > 75%    Lower Body Dressing/Undressing Lower body dressing      What is the patient wearing?: Underwear/pull up     Lower body assist Assist for lower body dressing: Moderate Assistance - Patient 50 - 74%     Toileting Toileting    Toileting assist Assist for toileting: Moderate Assistance -  Patient 50 - 74%     Transfers Chair/bed transfer  Transfers assist     Chair/bed transfer assist level: Moderate Assistance - Patient 50 - 74%     Locomotion Ambulation   Ambulation assist   Ambulation activity did not occur: Safety/medical concerns          Walk 10 feet activity   Assist  Walk 10 feet activity did not occur: Safety/medical concerns        Walk 50 feet activity   Assist Walk 50 feet with 2 turns activity did not occur: Safety/medical concerns         Walk 150 feet activity   Assist Walk 150 feet activity did not occur: Safety/medical concerns         Walk 10 feet on uneven surface  activity   Assist Walk 10 feet on uneven  surfaces activity did not occur: Safety/medical concerns         Wheelchair     Assist Will patient use wheelchair at discharge?: Yes Type of Wheelchair: Manual    Wheelchair assist level: Supervision/Verbal cueing Max wheelchair distance: 100'    Wheelchair 50 feet with 2 turns activity    Assist        Assist Level: Supervision/Verbal cueing   Wheelchair 150 feet activity     Assist      Assist Level: Minimal Assistance - Patient > 75%   Blood pressure 135/60, pulse 61, temperature 98.2 F (36.8 C), temperature source Oral, resp. rate 16, height '5\' 3"'$  (1.6 m), weight 102.3 kg, SpO2 100 %.  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 -patient may showerif residual limb is water-proofed -ELOS/Goals: 7-10 days, Mod I at w/c level  -Continue CIR 2. Antithrombotics: -DVT/anticoagulation:Pharmaceutical:Continue Heparin -antiplatelet therapy: ASA/Plavix 3. Pain Management:ContinueOxycodone prn--received 2 doses 2/20  2/21- having more phantom pain than anything- on no phantom pain meds- will start Gabapentin 300 mg QHS for now- since on HD- max dose we can usually use.  4. Mood:LCSW to follow for evaluation and support. -antipsychotic agents: N/A 5. Neuropsych: This patientiscapable of making decisions on herown behalf. 6. Skin/Wound Care:Protein supplement for hypoalbuminemia  --monitor wound for healing.  -apply dry dressing and ACE to residual limb -apply small amount of gauze to protect gangrenous second toe which is about to auto-amputate  7. Fluids/Electrolytes/Nutrition:Strict I/O. Labs with HD. 1200cc/FR. 8. ESRD: Schedule HD at the end of the day on MWF to help with tolerance of therapy.  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   -Cr increased to 11.13 on 2/19- appreciate management by nephrology 9. T2DM with nephropathy: Hgb A1c- 6.4 on last check. Was on Lantus 123XX123 units with Trulicity 1.5 mg/wk.  --Monitor BS ac/hs and use SSI for elevated BS --Continue to titrate Lantus to home dose as indicated .  2/21- BGs 118-137- controlled overall- con't regimen 10 HTN: Monitor BP tid. Has been labile --Continue Metoprolol and amlodipine. 11. PVD: Continue Plavix, ASA and Lipitor 12. Morbid obesity: BMI 39.01. --educate on diet as well as importance of weight loss to help promote health and mobility.  13. Anemia of chronic disease: Being managed with weekly Aranesp.  14. Thrombocytosis: Continue to trend--improved from peak of 774-->600 15. Leukocytosis: discussed with patient that WBC is elevated. She is asymptomatic and without fever, repeat with next dialysis labs to trend  2/21- WBC up to 15k-will check U/A and Cx since  pt is oliguric, not anuric- most likely cause vs her dry gangrene of R foot/toes- will reassess in Am and recheck CBC/renal fxn panel in AM- even though it's not at HD, need the results.  16. Hyperkalemia/Hyponatremia  2/21- will d/w renal- NA down to 126 and K+ up to 5.6.    LOS: 3 days A FACE TO FACE EVALUATION WAS PERFORMED  Tanai Bouler 06/19/2020, 8:10 PM

## 2020-06-19 NOTE — Progress Notes (Signed)
Physical Therapy Session Note  Patient Details  Name: Christy Zhang MRN: [id number] Date of Birth: 15-Jan-1956  Today's Date: 06/19/2020 PT Individual Time: 0900-0955 PT Individual Time Calculation (min): 55 min   Short Term Goals: Week 1:  PT Short Term Goal 1 (Week 1): STG=LTG due to ELOS      Skilled Therapeutic Interventions/Progress Updates:    pt received in bed and agreeable to therapy. Pt reported pain at start of session but reported she had just had pain medicine prior to therapy. Pt reported 2/10 pain at end of session in residual limb. Pt directed in supine>sit CGA from slightly elevated HOB. Pt sat EOB CGA for donning gait belt, reported she had just gotten a bath and was slightly fatigued but agreeable. Pt then directed in Sit to stand and Stand pivot transfer with Rolling walker to WC mod A to stand from bed and min A to pivot to WC, VC for hopping technique with inability to clear floor completed but improved technique with scooting/hopping for skin integrity. Pt taken to gym in [facility] total A for time and energy. Pt directed in seated BLE strengthening exercises with orange theraband: 3x15 hip abduction, adduction, hip flexion, and R hamstring curls and knee extension. Pt then directed in [place] mobility for 2x100' CGA with extra time d/t decreased speed and need of x2 rest breaks, pt directed in turning with WC, min A to complete. Pt then requested to return to bed to rest, Sit to stand and Stand pivot transfer to Rolling walker grossly min A with extra time to pivot to bedside. Pt then directed in x3 lateral very small hops for improved positioning in bed, CGA for sit>supine. Post short rest break, pt agreeable to 3x15 heel slides on RLE, LLE leg lift and hip abduction/adduction. Pt then 2x15 bridging with min A to complete for management of LLE for lifting off bed to prevent weight bearing. Pt then left in bed, All needs in reach and in good condition. Call light in hand.  And alarm set.    Therapy Documentation Precautions:  Precautions Precautions: Fall Precaution Comments: falls, R 2nd toe wound Required Braces or Orthoses: Knee Immobilizer - Left Knee Immobilizer - Left: On at all times Restrictions Weight Bearing Restrictions: Yes LLE Weight Bearing: Non weight bearing General:   Vital Signs:  Pain:   Mobility:   Locomotion :    Trunk/Postural Assessment :    Balance:   Exercises:   Other Treatments:      Therapy/Group: Individual Therapy  [name] 06/19/2020, 12:18 PM

## 2020-06-20 ENCOUNTER — Ambulatory Visit: Payer: Medicare Other | Admitting: *Deleted

## 2020-06-20 DIAGNOSIS — S88112A Complete traumatic amputation at level between knee and ankle, left lower leg, initial encounter: Secondary | ICD-10-CM

## 2020-06-20 DIAGNOSIS — E1122 Type 2 diabetes mellitus with diabetic chronic kidney disease: Secondary | ICD-10-CM

## 2020-06-20 DIAGNOSIS — IMO0002 Reserved for concepts with insufficient information to code with codable children: Secondary | ICD-10-CM

## 2020-06-20 LAB — GLUCOSE, CAPILLARY
Glucose-Capillary: 115 mg/dL — ABNORMAL HIGH (ref 70–99)
Glucose-Capillary: 136 mg/dL — ABNORMAL HIGH (ref 70–99)

## 2020-06-20 LAB — CBC WITH DIFFERENTIAL/PLATELET
Abs Immature Granulocytes: 0.15 10*3/uL — ABNORMAL HIGH (ref 0.00–0.07)
Basophils Absolute: 0.1 10*3/uL (ref 0.0–0.1)
Basophils Relative: 1 %
Eosinophils Absolute: 0.5 10*3/uL (ref 0.0–0.5)
Eosinophils Relative: 4 %
HCT: 23.9 % — ABNORMAL LOW (ref 36.0–46.0)
Hemoglobin: 7.6 g/dL — ABNORMAL LOW (ref 12.0–15.0)
Immature Granulocytes: 1 %
Lymphocytes Relative: 16 %
Lymphs Abs: 2.2 10*3/uL (ref 0.7–4.0)
MCH: 25.9 pg — ABNORMAL LOW (ref 26.0–34.0)
MCHC: 31.8 g/dL (ref 30.0–36.0)
MCV: 81.3 fL (ref 80.0–100.0)
Monocytes Absolute: 1 10*3/uL (ref 0.1–1.0)
Monocytes Relative: 7 %
Neutro Abs: 9.4 10*3/uL — ABNORMAL HIGH (ref 1.7–7.7)
Neutrophils Relative %: 71 %
Platelets: 623 10*3/uL — ABNORMAL HIGH (ref 150–400)
RBC: 2.94 MIL/uL — ABNORMAL LOW (ref 3.87–5.11)
RDW: 16.9 % — ABNORMAL HIGH (ref 11.5–15.5)
WBC: 13.3 10*3/uL — ABNORMAL HIGH (ref 4.0–10.5)
nRBC: 0 % (ref 0.0–0.2)

## 2020-06-20 LAB — URINALYSIS, ROUTINE W REFLEX MICROSCOPIC
Bacteria, UA: NONE SEEN
Bilirubin Urine: NEGATIVE
Glucose, UA: NEGATIVE mg/dL
Ketones, ur: NEGATIVE mg/dL
Leukocytes,Ua: NEGATIVE
Nitrite: NEGATIVE
Protein, ur: 30 mg/dL — AB
Specific Gravity, Urine: 1.012 (ref 1.005–1.030)
pH: 5 (ref 5.0–8.0)

## 2020-06-20 LAB — RENAL FUNCTION PANEL
Albumin: 2.4 g/dL — ABNORMAL LOW (ref 3.5–5.0)
Anion gap: 12 (ref 5–15)
BUN: 41 mg/dL — ABNORMAL HIGH (ref 8–23)
CO2: 24 mmol/L (ref 22–32)
Calcium: 8.3 mg/dL — ABNORMAL LOW (ref 8.9–10.3)
Chloride: 94 mmol/L — ABNORMAL LOW (ref 98–111)
Creatinine, Ser: 7.09 mg/dL — ABNORMAL HIGH (ref 0.44–1.00)
GFR, Estimated: 6 mL/min — ABNORMAL LOW (ref >60)
Glucose, Bld: 117 mg/dL — ABNORMAL HIGH (ref 70–99)
Phosphorus: 4.6 mg/dL (ref 2.5–4.6)
Potassium: 4 mmol/L (ref 3.5–5.1)
Sodium: 130 mmol/L — ABNORMAL LOW (ref 135–145)

## 2020-06-20 LAB — IRON AND TIBC
Iron: 38 ug/dL (ref 28–170)
Saturation Ratios: 24 % (ref 10.4–31.8)
TIBC: 161 ug/dL — ABNORMAL LOW (ref 250–450)
UIBC: 123 ug/dL

## 2020-06-20 LAB — FERRITIN: Ferritin: 883 ng/mL — ABNORMAL HIGH (ref 11–307)

## 2020-06-20 MED ORDER — SODIUM CHLORIDE 0.9 % IV SOLN
125.0000 mg | INTRAVENOUS | Status: DC
  Administered 2020-06-21 – 2020-06-26 (×3): 125 mg via INTRAVENOUS
  Filled 2020-06-20 (×6): qty 10

## 2020-06-20 MED ORDER — CHLORHEXIDINE GLUCONATE CLOTH 2 % EX PADS: 6.0000 | MEDICATED_PAD | Freq: Every day | CUTANEOUS | Status: DC

## 2020-06-20 NOTE — Patient Care Conference (Signed)
Inpatient RehabilitationTeam Conference and Plan of Care Update Date: 06/20/2020   Time: 11:48 AM    Patient Name: Christy Zhang      Medical Record Number: [id number]  Date of Birth: [birth year] Sex: Female         Room/Bed: 4M07C/4M07C-01 Payor Info: Payor: MEDICARE / Plan: MEDICARE PART A AND B / Product Type: *No Product type* /    Admit Date/Time:  06/16/2020  3:32 PM  Primary Diagnosis:  Below-knee amputation of left lower extremity [facility])  Hospital Problems: Principal Problem:   Below-knee amputation of left lower extremity [facility])    Expected Discharge Date: Expected Discharge Date: 07/04/20  Team Members Present: Physician leading conference: Dr. Courtney Heys Care Coodinator Present: Dorthula Nettles, RN, BSN, CRRN;Christina Sampson Goon, [place] Nurse Present: Rozetta Nunnery, RN PT Present: Stacy Gardner, PT OT Present: Lillia Corporal, OT PPS Coordinator present : Gunnar Fusi, SLP     Current Status/Progress Goal Weekly Team Focus  Bowel/Bladder   Pt is continent x2.  Pt will remain continent x2.  Assess q shift and prn.   Swallow/Nutrition/ Hydration             ADL's   Mod LB bathe/dress, Min UB ADLs, Min/Mod stand pivot transfers, Mod toileting (from [place])  [place] for LB dress/bathe, Superviison transfers  sit > standss, stand balance and tolerance, toilet transfers, performing tasks in standing, endurance, UB strength   Mobility   min A-CGA bed mob; transfers min A STS and mod A-min A SPT with RW; WC 100' min A WC  supervision bed mob; min A car transfers; 5' supervision gait with RW; 150' WC mod I  increased activity tolerance; promote slide board transfer; strengthening; WC mobility   Communication             Safety/Cognition/ Behavioral Observations            Pain   Pt reports pain is controlled with medication.  Pain will remain <3.  Assess q shift and prn.   Skin   Pt has L BKA and R gangrene toe.  Skin will continue to heal and reamain infection free.   Assess q shift and prn.     Discharge Planning:      Team Discussion: WBC's up yesterday, BKA incision looks good, 3/5 gangrene toes on right foot. UA sent to lab this morning. Blood sugars changed to BID, graft site looks good, little more than scant drainage on back side of incision site. Plan is to DC home with daughter.   Patient on target to meet rehab goals: Lower body ADL's mod assist with AD, stands with mod assist, mod assist transfers. Mod assist overall. Min assist/contact guard for bed mobility. Lost balance this morning and was a mod/max assist to sit back down on the bed. Family is to bring in tennis shoe which should help with balance.   *See Care Plan and progress notes for long and short-term goals.   Revisions to Treatment Plan:  MD requested that therapy teach slideboard transfers as well, due to likelihood of having another amputation in the future. MD started Gabapentin for nerve pain. Teaching Needs: Family education, medication management, skin/wound care, transfers training, gait training, balance training, slide board training, endurance training.  Current Barriers to Discharge: Inaccessible home environment, Decreased caregiver support, Home enviroment access/layout, Wound care, Lack of/limited family support, Hemodialysis, Weight bearing restrictions and Behavior  Possible Resolutions to Barriers: Continue current medications, provide emotional support to patient and family.  Medical Summary Current Status: WBC 13.3 (down from 15k)- still elevated- cause?; U/A sent- needed in/cath-is pending; LBM this AM;  Barriers to Discharge: Decreased family/caregiver support;Home enviroment access/layout;Medical stability;Other (comments);Weight;Weight bearing restrictions;Wound care;Hemodialysis  Barriers to Discharge Comments: leukocytosis- no cause wound wise; L BKA; home with daughter- who is her Physiological scientist; Possible Resolutions to [employer]: will start  ABX when figure out cause; monitor skin- change BGs to BID- focus on nerve pain- started Gabapentin 300 mg QHS- max dose. d/c- 3/8   Continued Need for Acute Rehabilitation Level of Care: The patient requires daily medical management by a physician with specialized training in physical medicine and rehabilitation for the following reasons: Direction of a multidisciplinary physical rehabilitation program to maximize functional independence : Yes Medical management of patient stability for increased activity during participation in an intensive rehabilitation regime.: Yes Analysis of laboratory values and/or radiology reports with any subsequent need for medication adjustment and/or medical intervention. : Yes   I attest that I was present, lead the team conference, and concur with the assessment and plan of the team.   Cristi Loron 06/20/2020, 1:02 PM

## 2020-06-20 NOTE — Patient Instructions (Signed)
Follow up plan: Telephone follow up appointment with care management team member scheduled for: LCSW 07/11/20 Next PCP appointment scheduled for: Dr Livia Snellen 07/06/20 RNCM to follow-up with patient s/p discharge  Chong Sicilian, BSN, RN-BC [place] [facility] Management Direct Dial: [phone]

## 2020-06-20 NOTE — Progress Notes (Signed)
Patient ID: Christy Zhang, female   DOB: 03-06-1956, 65 y.o.   MRN: [id number]  Met with pt and spoke with daughter via telephone to inform team conference goals supervision-min assist and target discharge date of 3/8. Daughter please she is doing well and will bring her a shoe for tranfers. She doesn't wear tennis shoes but crocks. Daughter will be here tomorrow to bring clothes and her shoe. Continue to work on discharge needs.

## 2020-06-20 NOTE — Progress Notes (Signed)
PROGRESS NOTE   Subjective/Complaints:  Has no Sx's of Reps infection no SOB, no breathing issues- also denied any Sx's of feeling ill at all- including dysuria, etc. U/A is pending- WBC down to 13.3, however is still somewhat elevated.  Leg pain better-  Didn't hurt a lot last night/this AM- slept well.     ROS:   Pt denies SOB, abd pain, CP, N/V/C/D, and vision changes   Objective:   No results found. Recent Labs    06/19/20 1413 06/20/20 0521  WBC 15.0* 13.3*  HGB 7.6* 7.6*  HCT 24.1* 23.9*  PLT 646* 623*   Recent Labs    06/19/20 1413 06/20/20 0521  NA 126* 130*  K 5.6* 4.0  CL 91* 94*  CO2 22 24  GLUCOSE 136* 117*  BUN 74* 41*  CREATININE 10.15* 7.09*  CALCIUM 8.4* 8.3*    Intake/Output Summary (Last 24 hours) at 06/20/2020 0852 Last data filed at 06/20/2020 0744 Gross per 24 hour  Intake 360 ml  Output 2000 ml  Net -1640 ml        Physical Exam: Vital Signs Blood pressure 138/62, pulse 70, temperature 98.1 F (36.7 C), temperature source Oral, resp. rate 16, height '5\' 3"'$  (1.6 m), weight 103.6 kg, SpO2 99 %. Gen: sitting upright in bed; OT in room, NAD HEENT: oral mucosa pink and moist, NCAT, Missing numerous teeth- conjugate gaze Cardio:RRR- no JVD Chest: CTA B/L- no W/R/R- good air movement- sounds great Abd: Soft, NT, ND, (+)BS - protuberant Ext: no edema in RLE Psych: appropriate- no change Skin: intact Musculoskeletal:  Cervical back: Normal range of motion.  Comments: L BKA has retention sutures and staples- a little sanguinous drainage on dressing, nothing active- looks good- no dog ears, swelling improved- wearing ACE wraps and KI Skin: General: Skin is warm.  Comments: Chronic changes in RLE, 3rd digit wrapped in kerlex; 2nd and 4th toe have dry gangrene Skin very dry in foot/leg. Neurological: Ox3 Comments: Alert and oriented x 3. Normal insight and awareness.  Intact Memory. Normal language and speech. Cranial nerve exam unremarkable. UE motor 4+ to 5/5. RLE: 3+/5 HF, 4/5 KE and 3-4/5 ADF/PF. LLE: able to lift leg off bed about 6". Decreased LT RLE distally.     Assessment/Plan: 1. Functional deficits which require 3+ hours per day of interdisciplinary therapy in a comprehensive inpatient rehab setting.  Physiatrist is providing close team supervision and 24 hour management of active medical problems listed below.  Physiatrist and rehab team continue to assess barriers to discharge/monitor patient progress toward functional and medical goals  Care Tool:  Bathing    Body parts bathed by patient: Face,Right arm,Chest,Left arm,Abdomen,Left upper leg,Right upper leg,Front perineal area   Body parts bathed by helper: Buttocks,Right lower leg Body parts n/a: Left lower leg   Bathing assist Assist Level: Minimal Assistance - Patient > 75%     Upper Body Dressing/Undressing Upper body dressing   What is the patient wearing?: Hospital gown only    Upper body assist Assist Level: Minimal Assistance - Patient > 75%    Lower Body Dressing/Undressing Lower body dressing      What is the patient  wearing?: Underwear/pull up     Lower body assist Assist for lower body dressing: Moderate Assistance - Patient 50 - 74%     Toileting Toileting    Toileting assist Assist for toileting: Moderate Assistance - Patient 50 - 74%     Transfers Chair/bed transfer  Transfers assist     Chair/bed transfer assist level: Moderate Assistance - Patient 50 - 74%     Locomotion Ambulation   Ambulation assist   Ambulation activity did not occur: Safety/medical concerns          Walk 10 feet activity   Assist  Walk 10 feet activity did not occur: Safety/medical concerns        Walk 50 feet activity   Assist Walk 50 feet with 2 turns activity did not occur: Safety/medical concerns         Walk 150 feet activity   Assist  Walk 150 feet activity did not occur: Safety/medical concerns         Walk 10 feet on uneven surface  activity   Assist Walk 10 feet on uneven surfaces activity did not occur: Safety/medical concerns         Wheelchair     Assist Will patient use wheelchair at discharge?: Yes Type of Wheelchair: Manual    Wheelchair assist level: Supervision/Verbal cueing Max wheelchair distance: 100'    Wheelchair 50 feet with 2 turns activity    Assist        Assist Level: Supervision/Verbal cueing   Wheelchair 150 feet activity     Assist      Assist Level: Minimal Assistance - Patient > 75%   Blood pressure 138/62, pulse 70, temperature 98.1 F (36.7 C), temperature source Oral, resp. rate 16, height '5\' 3"'$  (1.6 m), weight 103.6 kg, SpO2 99 %.  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 -patient may showerif residual limb is water-proofed -ELOS/Goals: 7-10 days, Mod I at w/c level  -Continue CIR 2. Antithrombotics: -DVT/anticoagulation:Pharmaceutical:Continue Heparin -antiplatelet therapy: ASA/Plavix 3. Pain Management:ContinueOxycodone prn--received 2 doses 2/20  2/21- having more phantom pain than anything- on no phantom pain meds- will start Gabapentin 300 mg QHS for now- since on HD- max dose we can usually use.   2/22- pain better today- con't regimen 4. Mood:LCSW to follow for evaluation and support. -antipsychotic agents: N/A 5. Neuropsych: This patientiscapable of making decisions on herown behalf. 6. Skin/Wound Care:Protein supplement for hypoalbuminemia  --monitor wound for healing.  -apply dry dressing and ACE to residual limb -apply small amount of gauze to protect gangrenous second toe which is about to auto-amputate   2/22- L BKA looking better- healing; stable  in RLE/toes- doesn't appear to be source of Leukocytosis 7. Fluids/Electrolytes/Nutrition:Strict I/O. Labs with HD. 1200cc/FR. 8. ESRD: Schedule HD at the end of the day on MWF to help with tolerance of therapy.  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   -Cr increased to 11.13 on 2/19- appreciate management by nephrology 9. T2DM with nephropathy: Hgb A1c- 6.4 on last check. Was on Lantus 123XX123 units with Trulicity 1.5 mg/wk.  --Monitor BS ac/hs and use SSI for elevated BS --Continue to titrate Lantus to home dose as indicated .   2/21- BGs 118-137- controlled overall- con't regimen 10 HTN: Monitor BP tid. Has been labile --Continue Metoprolol and amlodipine. 11. PVD: Continue Plavix, ASA and Lipitor 12. Morbid obesity: BMI 39.01. --educate on diet as well as  importance of weight loss to help promote health and mobility.  13. Anemia of chronic disease: Being managed with weekly Aranesp.  14. Thrombocytosis: Continue to trend--improved from peak of 774-->600 15. Leukocytosis: discussed with patient that WBC is elevated. She is asymptomatic and without fever, repeat with next dialysis labs to trend  2/21- WBC up to 15k-will check U/A and Cx since pt is oliguric, not anuric- most likely cause vs her dry gangrene of R foot/toes- will reassess in Am and recheck CBC/renal fxn panel in AM- even though it's not at HD, need the results.   2/22- WBC down to 13.3k - U/A and Cx is pending. D/w nursing 16. Hyperkalemia/Hyponatremia  2/21- will d/w renal- NA down to 126 and K+ up to 5.6.   2/22- K+ down to 4.0 and Na up to 130- con't per renal   LOS: 4 days A FACE TO FACE EVALUATION WAS PERFORMED  Kyndall Amero 06/20/2020, 8:52 AM

## 2020-06-20 NOTE — Progress Notes (Signed)
Occupational Therapy Session Note  Patient Details  Name: LICHELLE HERSCHBERGER MRN: [id number] Date of Birth: January 28, 1956  Today's Date: 06/20/2020 OT Individual Time: [id number] and [id number] OT Individual Time Calculation (min): 57 min and 25 min   Short Term Goals: Week 1:  OT Short Term Goal 1 (Week 1): Pt will complete BSC transfer with CGA + AE PRN OT Short Term Goal 2 (Week 1): Pt will complete standing bimanual ADL  for >2 min with CGA + AE PRN. OT Short Term Goal 3 (Week 1): Pt will don shirt with close S.  Skilled Therapeutic Interventions/Progress Updates:    Pt greeted at time of session supine in bed with MD making rounds, checking surgical site and looks good. Therapist performed tapping/rubbing and pt ed on techniques to decrease phantom limb pain. RN present at this time and performed dressing change, therapist applie ace wraps to residual limb. Supine > sit CGA with bed rail and scoot EOB Supervision. Sit > stand Min/Mod and stand pivot to wheelchair Mod A. Set up at sink and performed oral hygiene set up, bathed UB/LB with Mod A for LB reaching buttocks and RLE pasat knee lvevl. Needing to use bathroom urgenctly, stand pivot to [facility] over toilet with Mod A using grab bar. (+) BM, Mod A clothing management/hygiene in standing before stand pivot back to wheelchair Min/Mod with grab bar. Set up in wheelchair with alarm on call bell in reach.  Session 2: Pt greeted at time of session sitting up in wheelchair agreeable to OT session, no phantom pain. Self propel from room > nurses station before fatigue and therapist assist for remaining distance to gym. Performed UB strengthening with red 1kg red ball for 2x15 core twists L/R and overhead reaches, 1x15 with 4# dowel bicep curl, chest press, overhead press, FWD and backward circles with brief rest breaks. Transported back to room and set up with alarm on call bell in reach.    Therapy Documentation Precautions:  Precautions Precautions:  Fall Precaution Comments: falls, R 2nd toe wound Required Braces or Orthoses: Knee Immobilizer - Left Knee Immobilizer - Left: On at all times Restrictions Weight Bearing Restrictions: Yes LLE Weight Bearing: Non weight bearing    Therapy/Group: Individual Therapy  Viona Gilmore 06/20/2020, 8:06 AM

## 2020-06-20 NOTE — Progress Notes (Signed)
Physical Therapy Session Note  Patient Details  Name: Christy Zhang MRN: [id number] Date of Birth: 20-Dec-1955  Today's Date: 06/20/2020 PT Individual Time: 1000-1056 and [id number] PT Individual Time Calculation (min): 56 min and 33 mins  Short Term Goals: Week 1:  PT Short Term Goal 1 (Week 1): STG=LTG due to ELOS     Skilled Therapeutic Interventions/Progress Updates:    session 1: pt received in bed and agreeable to therapy. Pt reported 4/10 pain in residual limb at start of session and 2/10 pain at residual limb at end of session. Pt directed in supine>sit EOB CGA with HOB elevated. Pt directed in Sit to stand from bed to Rolling walker mod A x2 attempts to complete and began to pivot to [facility], had LOB posteriorly and required mod A to return to bedside safely. Pt then directed in Stand pivot transfer to [facility] at pt's Rt mod A for stability and VC for hopping technique for skin integrity of Rt foot. Pt unable to clear floor with hopping but slightly improved technique noted. Pt then directed in WC mobility 90' on level surface CGA and one min A for turning. Nursing made aware and agreeable to pt leaving unit for therapy. Pt then taken to first floor for WC mobility in open, distractible environment for 100'x3, 75' with x4 turns and 2' with one turn grossly CGA with poor cadence and VC for propulsion effectiveness with BUE technique fair improvement. Pt required rest breaks throughout 2/2 decreased activity tolerance and fatigue. Pt returned to unit with CGA for 75' to elevator and mod A for elevator threshold, CGA for additional 100' then PT completed distance to room. Pt then directed in seated BLE strengthening exercises orange theraband of hip abduction, adduction, hip flexion 2x10. Pt left in WC, alarm belt set, All needs in reach and in good condition. Call light in hand.    Session 2: pt received in [facility] and agreeable to therapy. Pt requested to do less WC mobility this session and more emphasis  on exercises. Pt agreeable to shorter distances in WC, 75'x2 min A and decreased pace noted. Pt taken rest of distance to gym for time and energy. Pt directed in seated BLE strengthening with 1.5 weight on RLE for hip flexion, knee extension, knee flexion; on LLE hip flexion; on BLE hip abduction and adduction 3x15. Pt returned to room requested to return to bed, directed in slide board transfer for improved skin integrity on RLE and prep pt for various transfer devices as needed, mod A for sequencing and technique overall, VC for Head/hips relationship; CGA for sit>supine. Pt left in bed, All needs in reach and in good condition. Call light in hand.  And alarm set.   Therapy Documentation Precautions:  Precautions Precautions: Fall Precaution Comments: falls, R 2nd toe wound Required Braces or Orthoses: Knee Immobilizer - Left Knee Immobilizer - Left: On at all times Restrictions Weight Bearing Restrictions: Yes LLE Weight Bearing: Non weight bearing General:   Vital Signs: Therapy Vitals Pulse Rate: 70 BP: 138/62 Pain: Pain Assessment Pain Scale: 0-10 Pain Score: 2  Mobility:   Locomotion :    Trunk/Postural Assessment :    Balance:   Exercises:   Other Treatments:      Therapy/Group: Individual Therapy  Christy Zhang 06/20/2020, 11:57 AM

## 2020-06-20 NOTE — Chronic Care Management (AMB) (Signed)
  Chronic Care Management   Care Coordination  06/20/2020 Name: Christy Zhang MRN: [id number] DOB: 1955-06-19  Chart reviewed for care coordination. Patient underwent left BKA on 06/08/20 and was transferred to inpatient rehab on 06/16/20 with a projected discharge date of 07/04/20. Patient plans to go home with daughter providing care. RN Care Manager will f/u with patient and daughter after discharge to assess CCM nursing needs.   Follow up plan: Telephone follow up appointment with care management team member scheduled for: LCSW 07/11/20 Next PCP appointment scheduled for: Dr Livia Snellen 07/06/20 RNCM to follow-up with patient s/p discharge  Chong Sicilian, BSN, RN-BC [place] [facility] [place] Management Direct Dial: [phone]

## 2020-06-20 NOTE — Progress Notes (Signed)
[facility] Progress Note   Subjective:   Patient seen and examined in room.  Sitting in wheelchair.  No specific complaints.  Denies CP, SOB, and n/v/d.    Objective Vitals:   06/19/20 1940 06/20/20 0500 06/20/20 0531 06/20/20 0803  BP: 135/60  134/60 138/62  Pulse: 61  67 70  Resp: 16  16   Temp: 98.2 F (36.8 C)  98.1 F (36.7 C)   TempSrc: Oral  Oral   SpO2: 100%  99%   Weight:  103.6 kg    Height:       Physical Exam General:well developed female in NAD Heart:RRR, no mrg Lungs:CTAB, nml WOB on RA Abdomen:soft, NTND Extremities:L BKA, no LE edema Dialysis Access: RU AVG +b   Filed Weights   06/19/20 1401 06/19/20 1721 06/20/20 0500  Weight: 104.5 kg 102.3 kg 103.6 kg    Intake/Output Summary (Last 24 hours) at 06/20/2020 1227 Last data filed at 06/20/2020 0925 Gross per 24 hour  Intake 360 ml  Output 2100 ml  Net -1740 ml    Additional Objective Labs: Basic Metabolic Panel: Recent Labs  Lab 06/17/20 0727 06/19/20 1413 06/20/20 0521  NA 129* 126* 130*  K 5.0 5.6* 4.0  CL 94* 91* 94*  CO2 '22 22 24  '$ GLUCOSE 169* 136* 117*  BUN 82* 74* 41*  CREATININE 11.13* 10.15* 7.09*  CALCIUM 8.6* 8.4* 8.3*  PHOS 7.4* 6.4* 4.6   Liver Function Tests: Recent Labs  Lab 06/17/20 0727 06/19/20 1413 06/20/20 0521  ALBUMIN 2.2* 2.4* 2.4*   CBC: Recent Labs  Lab 06/15/20 0201 06/16/20 1815 06/17/20 0728 06/19/20 1413 06/20/20 0521  WBC 14.7* 12.3* 13.1* 15.0* 13.3*  NEUTROABS  --   --   --   --  9.4*  HGB 8.3* 8.6* 7.9* 7.6* 7.6*  HCT 26.9* 27.6* 25.6* 24.1* 23.9*  MCV 82.5 79.8* 81.8 81.1 81.3  PLT 600* 579* 646* 646* 623*   CBG: Recent Labs  Lab 06/19/20 0608 06/19/20 1210 06/19/20 2059 06/20/20 0623 06/20/20 1139  GLUCAP 118* 133* 122* 115* 136*   Iron Studies:  Recent Labs    06/20/20 0521  IRON 38  TIBC 161*  FERRITIN 883*   Lab Results  Component Value Date   INR 1.2 06/08/2020   Studies/Results: No results  found.  Medications:  . (feeding supplement) PROSource Plus  30 mL Oral BID BM  . amLODipine  10 mg Oral QHS  . aspirin EC  81 mg Oral QHS  . atorvastatin  40 mg Oral QHS  . Chlorhexidine Gluconate Cloth  6 each Topical Q0600  . clopidogrel  75 mg Oral Daily  . darbepoetin (ARANESP) injection - DIALYSIS  60 mcg Intravenous Q Fri-HD  . doxercalciferol  1.5 mcg Intravenous Q M,W,F-HD  . Dulaglutide  1.5 mg Subcutaneous Weekly  . gabapentin  300 mg Oral QHS  . heparin  5,000 Units Subcutaneous Q8H  . insulin glargine  20 Units Subcutaneous Daily  . lanthanum  1,000 mg Oral TID WC  . metoprolol succinate  50 mg Oral Daily  . multivitamin  1 tablet Oral QHS  . polyethylene glycol  17 g Oral Daily  . Ensure Max Protein  11 oz Oral Daily  . senna-docusate  1 tablet Oral BID  . sevelamer carbonate  2,400 mg Oral TID WC    Dialysis Orders: [facility] MWF 4hrs 2K 2.5Ca EDW 101kg RU AVG Heparin 1000 load and 1000 units qhr EPO 14000 units qHD Venofer  $'100mg'l$  qHD Hectorol 1.10mg qHD    Assessment/Plan: 1. Gangrene L foot - s/p L BKA on 2/10 by [facility]. Now in CIR  2. Leukocytosis - up and down.  13.3 today.  Asymptomatic.   BC negative. UA/UC pending.  3. ESRD-on MWF.K 4.0. Orders written for HD tomorrow per regular schedule.   4. HTN/ volume -BP close to goal. Continue amlodipineand metoprolol.Does not appear grossly volume overloaded. UF as tolerated  5. DM2 - peradmitting team.  6. Anemia ckd -Hgbtrending down, 7.6 yesterday.  On Aranesp 60 qwk with last on 2/19. Tsat 24% - start short Fe course.  7. MBD ckd -Calcium and phos in goal.  Continue renvela and VDRA.  8. Nutrition - Renal diet with fluid restrictions. Protein supplements, Vitamins.    LJen Mow PA-C [facility] 06/20/2020,12:27 PM  LOS: 4 days

## 2020-06-21 LAB — CBC
HCT: 25.1 % — ABNORMAL LOW (ref 36.0–46.0)
Hemoglobin: 8 g/dL — ABNORMAL LOW (ref 12.0–15.0)
MCH: 26.1 pg (ref 26.0–34.0)
MCHC: 31.9 g/dL (ref 30.0–36.0)
MCV: 81.8 fL (ref 80.0–100.0)
Platelets: 738 K/uL — ABNORMAL HIGH (ref 150–400)
RBC: 3.07 MIL/uL — ABNORMAL LOW (ref 3.87–5.11)
RDW: 17.4 % — ABNORMAL HIGH (ref 11.5–15.5)
WBC: 15.6 K/uL — ABNORMAL HIGH (ref 4.0–10.5)
nRBC: 0 % (ref 0.0–0.2)

## 2020-06-21 LAB — RENAL FUNCTION PANEL
Albumin: 2.6 g/dL — ABNORMAL LOW (ref 3.5–5.0)
Anion gap: 16 — ABNORMAL HIGH (ref 5–15)
BUN: 59 mg/dL — ABNORMAL HIGH (ref 8–23)
CO2: 22 mmol/L (ref 22–32)
Calcium: 8.8 mg/dL — ABNORMAL LOW (ref 8.9–10.3)
Chloride: 86 mmol/L — ABNORMAL LOW (ref 98–111)
Creatinine, Ser: 9.33 mg/dL — ABNORMAL HIGH (ref 0.44–1.00)
GFR, Estimated: 4 mL/min — ABNORMAL LOW (ref >60)
Glucose, Bld: 158 mg/dL — ABNORMAL HIGH (ref 70–99)
Phosphorus: 5.6 mg/dL — ABNORMAL HIGH (ref 2.5–4.6)
Potassium: 4.6 mmol/L (ref 3.5–5.1)
Sodium: 124 mmol/L — ABNORMAL LOW (ref 135–145)

## 2020-06-21 LAB — URINE CULTURE: Culture: NO GROWTH

## 2020-06-21 LAB — GLUCOSE, CAPILLARY
Glucose-Capillary: 143 mg/dL — ABNORMAL HIGH (ref 70–99)
Glucose-Capillary: 141 mg/dL — ABNORMAL HIGH (ref 70–99)
Glucose-Capillary: 134 mg/dL — ABNORMAL HIGH (ref 70–99)

## 2020-06-21 MED ORDER — ALUMINUM HYDROXIDE GEL 320 MG/5ML PO SUSP
5.0000 mL | Freq: Four times a day (QID) | ORAL | Status: DC | PRN
  Filled 2020-06-21: qty 30

## 2020-06-21 MED ORDER — HEPARIN SODIUM (PORCINE) 1000 UNIT/ML DIALYSIS: 1000.0000 [IU] | INTRAMUSCULAR | Status: DC | PRN

## 2020-06-21 MED ORDER — SODIUM CHLORIDE 0.9 % IV SOLN: 100.0000 mL | INTRAVENOUS | Status: DC | PRN

## 2020-06-21 MED ORDER — HEPARIN SODIUM (PORCINE) 1000 UNIT/ML DIALYSIS: 20.0000 [IU]/kg | INTRAMUSCULAR | Status: DC | PRN

## 2020-06-21 MED ORDER — ALTEPLASE 2 MG IJ SOLR: 2.0000 mg | Freq: Once | INTRAMUSCULAR | Status: DC | PRN

## 2020-06-21 MED ORDER — DOXERCALCIFEROL 4 MCG/2ML IV SOLN
INTRAVENOUS | Status: AC
  Filled 2020-06-21: qty 2

## 2020-06-21 MED ORDER — LIDOCAINE HCL (PF) 1 % IJ SOLN: 5.0000 mL | INTRAMUSCULAR | Status: DC | PRN

## 2020-06-21 MED ORDER — LIDOCAINE-PRILOCAINE 2.5-2.5 % EX CREA: 1.0000 "application " | TOPICAL_CREAM | CUTANEOUS | Status: DC | PRN

## 2020-06-21 MED ORDER — PENTAFLUOROPROP-TETRAFLUOROETH EX AERO: 1.0000 "application " | INHALATION_SPRAY | CUTANEOUS | Status: DC | PRN

## 2020-06-21 NOTE — Progress Notes (Signed)
Physical Therapy Session Note  Patient Details  Name: Christy Zhang MRN: [id number] Date of Birth: 01/03/1956  Today's Date: 06/21/2020 PT Individual Time: 1100-1157 PT Individual Time Calculation (min): 57 min   Short Term Goals: Week 1:  PT Short Term Goal 1 (Week 1): STG=LTG due to ELOS  Skilled Therapeutic Interventions/Progress Updates: Pt presents sitting in w/c and agreeable to therapy.  Pt negotiated w/c in hallways up to 50' before requiring seated rest break.  Pt transferred to mat table w/ min A and RW.  Pt transferred sit to stand w/ min A.  Pt requires verbal cues for R foot clearance and use of UEs to clear.  Pt sat EOB w/ and w/o foot support catching/bouncing and throwing large yellow T-ball.  Pt requires seated rest breaks.  Pt transferred to w/c w/ min A and taken to Dayroom for energy and time conservation.  Pt amb x 3' w/c > Nu-step w/ min A and RW.  Pt tolerated 5' x 2 at Level 1 for UE and RLE strengthening.  Pt wheeled self x 50' in hallways and returned to room w/ all needs in reach and chair alarm on.     Therapy Documentation Precautions:  Precautions Precautions: Fall Precaution Comments: falls, R 2nd toe wound Required Braces or Orthoses: Knee Immobilizer - Left Knee Immobilizer - Left: On at all times Restrictions Weight Bearing Restrictions: Yes LLE Weight Bearing: Non weight bearing General:   Vital Signs:  Pain: 4/10 L residual limb, meds given earlier, verified by nursing. Pain Assessment Pain Scale: 0-10 Pain Score: 4  Pain Type: Surgical pain Pain Location: Leg Pain Orientation: Left Pain Descriptors / Indicators: Aching;Discomfort Pain Frequency: Intermittent Pain Onset: Gradual Pain Intervention(s): Medication (See eMAR)    Therapy/Group: Individual Therapy  [name] 06/21/2020, 11:59 AM

## 2020-06-21 NOTE — Plan of Care (Signed)
Nutrition Education Note  RD consulted for renal and DM diet education.  Spoke with pt at bedside. Pt reports knowledge about renal diet as she has been on dialysis for 7 years. Pt states that she has talked with the dietitian at her outpatient HD facility multiple times regarding diet. She is aware of which foods to limit/avoid but doesn't always adhere to these diet recommendations. For example, pt occasionally eats pizza with family and does drink regular sodas (no dark sodas).  Discussed pt's current phosphorus and potassium lab results. Discussed need for pt to consume phosphate binders at mealtimes. Pt is aware that she is on Renvela and Fosrenol.  Provided "Phosphorus Content of Foods" and "Potassium Content of Foods" handouts from the Academy of [employer]etics to patient/family. Reviewed food groups and provided written recommended serving sizes specifically determined for patient's current nutritional status.  Explained why diet restrictions are needed and provided lists of foods to limit/avoid that are high potassium, sodium, and phosphorus. Provided specific recommendations on safer alternatives of these foods. Strongly encouraged compliance of this diet.  Discussed importance of protein intake at each meal and snack. Provided examples of how to maximize protein intake throughout the day. Discussed need for fluid restriction with dialysis, importance of minimizing weight gain between HD treatments, and renal-friendly beverage options.  Encouraged pt to discuss specific diet questions/concerns with RD at HD outpatient facility. Teach back method used.  Expect good compliance.  Current diet order is Renal/Carb Modified with 1200 ml fluid restriction, patient is consuming approximately 100% of meals at this time. Labs and medications reviewed. No further nutrition interventions warranted at this time. RD contact information provided. If additional nutrition issues arise, please  re-consult RD.   Gustavus Bryant, MS, RD, LDN Inpatient Clinical Dietitian Please see AMiON for contact information.

## 2020-06-21 NOTE — Progress Notes (Signed)
Occupational Therapy Session Note  Patient Details  Name: Christy Zhang MRN: [id number] Date of Birth: 1956/02/23  Today's Date: 06/21/2020 OT Individual Time: [id number] OT Individual Time Calculation (min): 24 min  and Today's Date: 06/21/2020 OT Missed Time: 36 Minutes Missed Time Reason: Unavailable (comment) (scheduled for HD during OT session, had to end session early)   Short Term Goals: Week 1:  OT Short Term Goal 1 (Week 1): Pt will complete BSC transfer with CGA + AE PRN OT Short Term Goal 2 (Week 1): Pt will complete standing bimanual ADL  for >2 min with CGA + AE PRN. OT Short Term Goal 3 (Week 1): Pt will don shirt with close S.   Skilled Therapeutic Interventions/Progress Updates:    Pt greeted at time of session sitting up in wheelchair just finished lunch, stating she was told from nursing staff that she would have HD today and be leaving soon. RN entered at this time and confirmed that pt did have HD scheduled for today and would be leaving in the next few minutes. Pt needing to use bathroom first, transported via wheelchair to bathroom and stand pivot to [facility] over toilet with Min A with use of grab bars and encouraged to hop instead of shear for RLE. Max A for clothing and total for hygiene s/p BM d/t urgency and time constraints. Stand pivot back to wheelchair same manner, stand pivot w/c > bed with Min/Mod with RW. Min A for sit > supine. Hand off to tech for transport to HD. Missed 36 mins of OT d/t dialysis.   Therapy Documentation Precautions:  Precautions Precautions: Fall Precaution Comments: falls, R 2nd toe wound Required Braces or Orthoses: Knee Immobilizer - Left Knee Immobilizer - Left: On at all times Restrictions Weight Bearing Restrictions: Yes LLE Weight Bearing: Non weight bearing    Therapy/Group: Individual Therapy  Viona Gilmore 06/21/2020, 9:05 AM

## 2020-06-21 NOTE — Progress Notes (Signed)
Physical Therapy Session Note  Patient Details  Name: BRAXLEY FERRON MRN: [id number] Date of Birth: 1956-01-10  Today's Date: 06/21/2020 PT Individual Time: 0900-0945 PT Individual Time Calculation (min): 45 min   Short Term Goals: Week 1:  PT Short Term Goal 1 (Week 1): STG=LTG due to ELOS  Skilled Therapeutic Interventions/Progress Updates:    Patient received sitting up in bed, agreeable to PT. RN present to provide AM rx and pain rx. She reports 2/10 pain at rest. PT providing rest breaks, repositioning and distractions to assist with pain management. She was able to complete bed mobility with HOB elevated and supervision. She was able to transfer to wc via slideboard and [place]. Wc mobility x100 ft with supervision. Patient does fatigue with this easily. Patient transferring to therapy mat via stand pivot with RW and MinA. Dynamic sitting balance batting ball with 2# dowel 3x20 with rest breaks between due to fatigue. UE chest press + shoulder press 3x5 with 2# dowel. Seated LAQ, marches 3# ankle weights 3x10. Patient returning to room in wc, seatbelt alarm on, call light within reach.  Therapy Documentation Precautions:  Precautions Precautions: Fall Precaution Comments: falls, R 2nd toe wound Required Braces or Orthoses: Knee Immobilizer - Left Knee Immobilizer - Left: On at all times Restrictions Weight Bearing Restrictions: Yes LLE Weight Bearing: Non weight bearing    Therapy/Group: Individual Therapy  Karoline Caldwell, PT, DPT, CBIS  06/21/2020, 8:03 AM

## 2020-06-21 NOTE — Progress Notes (Signed)
[facility] Progress Note   Subjective:   Patient seen and examined at bedside during dialysis.  No complaints at this time.  Reports dialysis and PT are going well.  Denies CP, SOB, orthopnea, weakness, dizziness and fatigue.    Objective Vitals:   06/21/20 1330 06/21/20 1340 06/21/20 1400 06/21/20 1430  BP: 135/65 130/60 112/61 126/67  Pulse: 66 64 (!) 58 66  Resp: 18     Temp: 97.7 F (36.5 C)     TempSrc: Oral     SpO2: 97%     Weight:      Height:       Physical Exam General:well developed female in NAD Heart:RRR, no mrg Lungs:CTAB, nml WOB on RA Abdomen:soft, NTND Extremities:no LE edema Dialysis Access: RU AVG cannulated   Filed Weights   06/20/20 0500 06/21/20 0500 06/21/20 1325  Weight: 103.6 kg 104.4 kg 105.7 kg    Intake/Output Summary (Last 24 hours) at 06/21/2020 1455 Last data filed at 06/21/2020 0730 Gross per 24 hour  Intake 580 ml  Output -  Net 580 ml    Additional Objective Labs: Basic Metabolic Panel: Recent Labs  Lab 06/17/20 0727 06/19/20 1413 06/20/20 0521  NA 129* 126* 130*  K 5.0 5.6* 4.0  CL 94* 91* 94*  CO2 '22 22 24  '$ GLUCOSE 169* 136* 117*  BUN 82* 74* 41*  CREATININE 11.13* 10.15* 7.09*  CALCIUM 8.6* 8.4* 8.3*  PHOS 7.4* 6.4* 4.6   Liver Function Tests: Recent Labs  Lab 06/17/20 0727 06/19/20 1413 06/20/20 0521  ALBUMIN 2.2* 2.4* 2.4*   CBC: Recent Labs  Lab 06/16/20 1815 06/17/20 0728 06/19/20 1413 06/20/20 0521 06/21/20 1426  WBC 12.3* 13.1* 15.0* 13.3* 15.6*  NEUTROABS  --   --   --  9.4*  --   HGB 8.6* 7.9* 7.6* 7.6* 8.0*  HCT 27.6* 25.6* 24.1* 23.9* 25.1*  MCV 79.8* 81.8 81.1 81.3 81.8  PLT 579* 646* 646* 623* 738*   Blood Culture    Component Value Date/Time   SDES URINE, RANDOM 06/20/2020 0921   SPECREQUEST NONE 06/20/2020 0921   CULT  06/20/2020 0921    NO GROWTH Performed at [facility] [address], [place], [place] [address]    REPTSTATUS 06/21/2020 FINAL 06/20/2020  0921    CBG: Recent Labs  Lab 06/19/20 2059 06/20/20 0623 06/20/20 1139 06/20/20 1948 06/21/20 0835  GLUCAP 122* 115* 136* 143* 141*   Iron Studies:  Recent Labs    06/20/20 0521  IRON 38  TIBC 161*  FERRITIN 883*   Lab Results  Component Value Date   INR 1.2 06/08/2020   Studies/Results: No results found.  Medications: . sodium chloride    . sodium chloride    . ferric gluconate (FERRLECIT/NULECIT) IV     . (feeding supplement) PROSource Plus  30 mL Oral BID BM  . amLODipine  10 mg Oral QHS  . aspirin EC  81 mg Oral QHS  . atorvastatin  40 mg Oral QHS  . Chlorhexidine Gluconate Cloth  6 each Topical Q0600  . Chlorhexidine Gluconate Cloth  6 each Topical Q0600  . clopidogrel  75 mg Oral Daily  . darbepoetin (ARANESP) injection - DIALYSIS  60 mcg Intravenous Q Fri-HD  . doxercalciferol  1.5 mcg Intravenous Q M,W,F-HD  . Dulaglutide  1.5 mg Subcutaneous Weekly  . gabapentin  300 mg Oral QHS  . heparin  5,000 Units Subcutaneous Q8H  . insulin glargine  20 Units Subcutaneous  Daily  . lanthanum  1,000 mg Oral TID WC  . metoprolol succinate  50 mg Oral Daily  . multivitamin  1 tablet Oral QHS  . polyethylene glycol  17 g Oral Daily  . Ensure Max Protein  11 oz Oral Daily  . senna-docusate  1 tablet Oral BID  . sevelamer carbonate  2,400 mg Oral TID WC    Dialysis Orders: [facility] MWF 4hrs 2K 2.5Ca EDW 101kg RU AVG Heparin 1000 load and 1000 units qhr EPO 14000 units qHD Venofer '100mg'$  qHD Hectorol 1.21mg qHD   Assessment/Plan: 1. 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 Leukocytosis - up and down. 15.6 today.  Asymptomatic. BC and UC negative.  3. ESRD-on MWF.K 4.6. HD today per regular schedule.  4. HTN/ volume -BP at goal. Continue amlodipineand metoprolol.Does not appear grossly volume overloaded.UF as tolerated 5. DM2 - peradmitting team. 6. Anemia ckd -Hgbincreased to 8.0 today. On Aranesp 60 qwk with last on  2/19. Tsat 24% - start short Fe course.  7. MBD ckd -Calcium and phos in goal.  Continue renvela and VDRA.  8. Nutrition - Renal diet with fluid restrictions. Protein supplements, Vitamins.    LJen Mow PA-C [facility] 06/21/2020,2:55 PM  LOS: 5 days

## 2020-06-22 ENCOUNTER — Inpatient Hospital Stay (HOSPITAL_COMMUNITY): Payer: Medicare Other

## 2020-06-22 LAB — CBC WITH DIFFERENTIAL/PLATELET
Abs Immature Granulocytes: 0.09 10*3/uL — ABNORMAL HIGH (ref 0.00–0.07)
Basophils Absolute: 0.1 10*3/uL (ref 0.0–0.1)
Basophils Relative: 1 %
Eosinophils Absolute: 0.6 10*3/uL — ABNORMAL HIGH (ref 0.0–0.5)
Eosinophils Relative: 4 %
HCT: 24.1 % — ABNORMAL LOW (ref 36.0–46.0)
Hemoglobin: 7.6 g/dL — ABNORMAL LOW (ref 12.0–15.0)
Immature Granulocytes: 1 %
Lymphocytes Relative: 15 %
Lymphs Abs: 2 10*3/uL (ref 0.7–4.0)
MCH: 26.1 pg (ref 26.0–34.0)
MCHC: 31.5 g/dL (ref 30.0–36.0)
MCV: 82.8 fL (ref 80.0–100.0)
Monocytes Absolute: 1.1 10*3/uL — ABNORMAL HIGH (ref 0.1–1.0)
Monocytes Relative: 8 %
Neutro Abs: 9.4 10*3/uL — ABNORMAL HIGH (ref 1.7–7.7)
Neutrophils Relative %: 71 %
Platelets: 648 10*3/uL — ABNORMAL HIGH (ref 150–400)
RBC: 2.91 MIL/uL — ABNORMAL LOW (ref 3.87–5.11)
RDW: 17.5 % — ABNORMAL HIGH (ref 11.5–15.5)
WBC: 13.2 10*3/uL — ABNORMAL HIGH (ref 4.0–10.5)
nRBC: 0 % (ref 0.0–0.2)

## 2020-06-22 LAB — RENAL FUNCTION PANEL
Albumin: 2.4 g/dL — ABNORMAL LOW (ref 3.5–5.0)
Anion gap: 10 (ref 5–15)
BUN: 38 mg/dL — ABNORMAL HIGH (ref 8–23)
CO2: 26 mmol/L (ref 22–32)
Calcium: 8.3 mg/dL — ABNORMAL LOW (ref 8.9–10.3)
Chloride: 93 mmol/L — ABNORMAL LOW (ref 98–111)
Creatinine, Ser: 6.82 mg/dL — ABNORMAL HIGH (ref 0.44–1.00)
GFR, Estimated: 6 mL/min — ABNORMAL LOW (ref >60)
Glucose, Bld: 106 mg/dL — ABNORMAL HIGH (ref 70–99)
Phosphorus: 4 mg/dL (ref 2.5–4.6)
Potassium: 4.3 mmol/L (ref 3.5–5.1)
Sodium: 129 mmol/L — ABNORMAL LOW (ref 135–145)

## 2020-06-22 LAB — GLUCOSE, CAPILLARY
Glucose-Capillary: 145 mg/dL — ABNORMAL HIGH (ref 70–99)
Glucose-Capillary: 107 mg/dL — ABNORMAL HIGH (ref 70–99)
Glucose-Capillary: 150 mg/dL — ABNORMAL HIGH (ref 70–99)

## 2020-06-22 MED ORDER — CHLORHEXIDINE GLUCONATE CLOTH 2 % EX PADS: 6.0000 | MEDICATED_PAD | Freq: Every day | CUTANEOUS | Status: DC

## 2020-06-22 MED ORDER — TRAMADOL HCL 50 MG PO TABS
50.0000 mg | ORAL_TABLET | Freq: Two times a day (BID) | ORAL | Status: DC | PRN
  Administered 2020-06-22 – 2020-07-01 (×3): 50 mg via ORAL
  Filled 2020-06-22 (×3): qty 1

## 2020-06-22 MED ORDER — HYDROCODONE-ACETAMINOPHEN 5-325 MG PO TABS
1.0000 | ORAL_TABLET | ORAL | Status: DC | PRN
  Administered 2020-06-22 – 2020-07-03 (×8): 1 via ORAL
  Filled 2020-06-22 (×7): qty 1

## 2020-06-22 NOTE — Evaluation (Signed)
Recreational Therapy Assessment and Plan  Patient Details  Name: Christy Zhang MRN: [id number] Date of Birth: Jul 16, 1955 Today's Date: 06/22/2020  Rehab Potential:   Good ELOS:   d/c 3/8   Assessment  Hospital Problem: Principal Problem:   Below-knee amputation of left lower extremity [facility])   Past Medical History:      Past Medical History:  Diagnosis Date  . Anemia   . Arthritis   . Asthma    in the past  . CHF (congestive heart failure) ([place])   . Chronic kidney disease    Renal failure, dialysis on M/W/F  . 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 End stage renal disease ([place])   . End stage renal disease on dialysis ([place])   . Hemodialysis access, arteriovenous graft ([place])   . Hyperlipidemia   . Hypertension   . Neuromuscular disorder [facility])    Past Surgical History:       Past Surgical History:  Procedure Laterality Date  .  LEFT BELOW KNEE AMPUTATION (Left Knee)  06/08/2020  . A/V FISTULAGRAM Right 02/04/2017   Procedure: A/V Fistulagram;  Surgeon: Waynetta Sandy, MD;  Location: [place] CV LAB;  Service: Cardiovascular;  Laterality: Right;  . ABDOMINAL AORTOGRAM W/LOWER EXTREMITY N/A 11/18/2019   Procedure: ABDOMINAL AORTOGRAM W/LOWER EXTREMITY;  Surgeon: [name], MD;  Location: [place] CV LAB;  Service: Cardiovascular;  Laterality: N/A;  . ABDOMINAL AORTOGRAM W/LOWER EXTREMITY N/A 02/23/2020   Procedure: ABDOMINAL AORTOGRAM W/LOWER EXTREMITY;  Surgeon: [name], MD;  Location: [place] CV LAB;  Service: Cardiovascular;  Laterality: N/A;  Bilateral  . AMPUTATION Left 06/08/2020   Procedure: LEFT BELOW KNEE AMPUTATION;  Surgeon: Cherre Robins, MD;  Location: [place];  Service: Vascular;  Laterality: Left;  . AV FISTULA PLACEMENT Right 08/25/2014   Procedure: RIGHT BRACHIOCEPHALIC ARTERIOVENOUS (AV) FISTULA CREATION;  Surgeon: Mal Misty, MD;  Location: [place];  Service: Vascular;   Laterality: Right;  . AV FISTULA PLACEMENT Right 03/18/2019   Procedure: INSERTION OF ARTERIOVENOUS (AV) GORE-TEX GRAFT RIGHT ARM;  Surgeon: Serafina Mitchell, MD;  Location: [place];  Service: Vascular;  Laterality: Right;  . BASCILIC VEIN TRANSPOSITION Right 12/24/2018   Procedure: FIRST STAGE BASILIC VEIN TRANSPOSITION RIGHT UPPER ARM;  Surgeon: Serafina Mitchell, MD;  Location: [place];  Service: Vascular;  Laterality: Right;  . FISTULA SUPERFICIALIZATION Right 11/03/2014   Procedure: RIGHT UPPER ARM FISTULA SUPERFICIALIZATION;  Surgeon: Mal Misty, MD;  Location: [place];  Service: Vascular;  Laterality: Right;  . FISTULOGRAM Right 01/16/2016   Procedure: RIGHT UPPER EXTREMITY ARTERIOVENOUS FISTULOGRAM WITH BALLOON ANGIOPLASTY;  Surgeon: Vickie Epley, MD;  Location: [facility];  Service: Vascular;  Laterality: Right;  . INSERTION OF DIALYSIS CATHETER     X4  . IR FLUORO GUIDE CV LINE RIGHT  12/03/2018  . IR REMOVAL TUN CV CATH W/O FL  07/06/2019  . IR THROMBECTOMY AV FISTULA W/THROMBOLYSIS/PTA INC/SHUNT/IMG RIGHT Right 12/03/2018  . IR THROMBECTOMY AV FISTULA W/THROMBOLYSIS/PTA/STENT INC/SHUNT/IMG RT Right 10/11/2019  . IR US GUIDE VASC ACCESS RIGHT  12/03/2018  . IR US GUIDE VASC ACCESS RIGHT  12/03/2018  . IR US GUIDE VASC ACCESS RIGHT  10/14/2019  . LIGATION OF COMPETING BRANCHES OF ARTERIOVENOUS FISTULA Right 11/03/2014   Procedure: LIGATION OF COMPETING BRANCHE x1 OF RIGHT UPPER ARM ARTERIOVENOUS FISTULA;  Surgeon: Mal Misty, MD;  Location: [place];  Service: Vascular;  Laterality: Right;  .  PERIPHERAL VASCULAR BALLOON ANGIOPLASTY Right 02/04/2017   Procedure: PERIPHERAL VASCULAR BALLOON ANGIOPLASTY;  Surgeon: Waynetta Sandy, MD;  Location: [place] CV LAB;  Service: Cardiovascular;  Laterality: Right;  . PERIPHERAL VASCULAR BALLOON ANGIOPLASTY Right 11/18/2019   Procedure: PERIPHERAL VASCULAR BALLOON ANGIOPLASTY;  Surgeon: [name], MD;  Location: [place] CV  LAB;  Service: Cardiovascular;  Laterality: Right;  anterior tibial  . PERIPHERAL VASCULAR BALLOON ANGIOPLASTY  02/23/2020   Procedure: PERIPHERAL VASCULAR BALLOON ANGIOPLASTY;  Surgeon: [name], MD;  Location: [place] CV LAB;  Service: Cardiovascular;;  Lt  AT  . TRANSMETATARSAL AMPUTATION Left 02/24/2020   Procedure: TRANSMETATARSAL AMPUTATION LEFT;  Surgeon: Cherre Robins, MD;  Location: [place];  Service: Vascular;  Laterality: Left;  . TUBAL LIGATION      Assessment & Plan Clinical Impression: Patient is a 65 year old female with history of ESRD-HD MWF, T2DM, HTN, morbid obesity--BMI 39, PVD with gangrenous changes left foot and multiple attempts at limb salvage. BKA was recommended in December but delayed to to request for second opinion, Covid infection and inclement weather. She was admitted on 06/08/20 for L-BKA by Dr. Stanford Breed. Post op course significant for LLE pain, leucocytosis with rise in WBC to 24.2--blood cultures X 2 negative. She was maintained on pre-op cefazolin thorough 02/13 and metoprolol resumed as bradycardia resolved. Nephrology following for management of HD MWF. Therapy has been ongoing and patient limited by fatigue, L-BKA and balance deficits affecting ADLs and mobility. Patient transferred to CIR on 06/16/2020 .   Pt presents with decreased activity tolerance, decreased functional mobility, decreased balance, and difficulty maintaining precautions.  Met with pt today to discuss TR services and pt with limited interest. Will continue to monitor through team for future participation.  Plan No TR at this time.  Recommendations for other services: None   Discharge Criteria: Patient will be discharged from TR if patient refuses treatment 3 consecutive times without medical reason.  If treatment goals not met, if there is a change in medical status, if patient makes no progress towards goals or if patient is discharged from hospital.  The above  assessment, treatment plan, treatment alternatives and goals were discussed and mutually agreed upon: by patient  [place] 06/22/2020, 10:49 AM

## 2020-06-22 NOTE — Progress Notes (Signed)
Assessed residual limb after returning from xray. Minimal amount of drainage to left ear where steri strips were applied earlier today due to drainage. No changes to incision at this time

## 2020-06-22 NOTE — Progress Notes (Signed)
PROGRESS NOTE   Subjective/Complaints:   Pt reports feeling great overall- doesn't feel sick; doesn't have dysuria, or any pains in anywhere additional that's new on her body.  No N/V, no resp symptoms. Discussed with pt doing Blood Cx's, CXR, and finish up work up needed for leukocytosis, which I also explained.    ROS:   Pt denies SOB, abd pain, CP, N/V/C/D, and vision changes   Objective:   DG Chest 2 View  Result Date: 06/22/2020 CLINICAL DATA:  Elevated WBC count. No chest complaints. Hx of CHF, asthma, CKD, ESRD, AND HTN. Pt is a former smoker. EXAM: CHEST - 2 VIEW COMPARISON:  Chest radiograph 08/25/2014 FINDINGS: Vascular graft noted in the right upper chest. Heart size is enlarged. The lungs are clear. No pneumothorax or pleural effusion. Degenerative changes are noted in the thoracic spine. IMPRESSION: Cardiomegaly.  No evidence of active disease. Electronically Signed   By: Audie Pinto M.D.   On: 06/22/2020 16:59   Recent Labs    06/21/20 1426 06/22/20 0506  WBC 15.6* 13.2*  HGB 8.0* 7.6*  HCT 25.1* 24.1*  PLT 738* 648*   Recent Labs    06/21/20 1426 06/22/20 0506  NA 124* 129*  K 4.6 4.3  CL 86* 93*  CO2 22 26  GLUCOSE 158* 106*  BUN 59* 38*  CREATININE 9.33* 6.82*  CALCIUM 8.8* 8.3*    Intake/Output Summary (Last 24 hours) at 06/22/2020 1718 Last data filed at 06/22/2020 1337 Gross per 24 hour  Intake 677 ml  Output --  Net 677 ml        Physical Exam: Vital Signs Blood pressure (!) 124/59, pulse 72, temperature 97.8 F (36.6 C), temperature source Oral, resp. rate 17, height '5\' 3"'$  (1.6 m), weight 103.3 kg, SpO2 94 %. Gen: sitting up in bed; appropriate, NAD HENT: oral mucosa pink and moist, NCAT, Missing numerous teeth- conjugate gaze- finished breakfast Cardio:RRR- no JVD Chest: CTA B/L- no W/R/R- good air movement- no resp issues on exam Abd: Soft, NT, ND, (+)BS  Ext: no edema  in RLE Psych: happy appearing Skin: intact Musculoskeletal:  Cervical back: Normal range of motion.  Comments: L BKA has retention sutures and staples- a little sanguinous drainage on dressing, nothing active- looks good- no dog ears, swelling improved- wearing ACE wraps and KI- looks the same as Tuesday except slightly open on 1 side- steristrips placed Skin: General: Skin is warm.  Comments: Chronic changes in RLE, 3rd digit wrapped in kerlex; 2nd and 4th toe have dry gangrene Skin very dry in foot/leg. Neurological: Ox3 Comments: Alert and oriented x 3. Normal insight and awareness. Intact Memory. Normal language and speech. Cranial nerve exam unremarkable. UE motor 4+ to 5/5. RLE: 3+/5 HF, 4/5 KE and 3-4/5 ADF/PF. LLE: able to lift leg off bed about 6". Decreased LT RLE distally.      Assessment/Plan: 1. Functional deficits which require 3+ hours per day of interdisciplinary therapy in a comprehensive inpatient rehab setting.  Physiatrist is providing close team supervision and 24 hour management of active medical problems listed below.  Physiatrist and rehab team continue to assess barriers to discharge/monitor patient progress toward functional and  medical goals  Care Tool:  Bathing    Body parts bathed by patient: Face,Right arm,Chest,Left arm,Abdomen,Right upper leg,Front perineal area,Left upper leg   Body parts bathed by helper: Buttocks,Right lower leg Body parts n/a: Left lower leg   Bathing assist Assist Level: Moderate Assistance - Patient 50 - 74%     Upper Body Dressing/Undressing Upper body dressing   What is the patient wearing?: Hospital gown only    Upper body assist Assist Level: Minimal Assistance - Patient > 75%    Lower Body Dressing/Undressing Lower body dressing      What is the patient wearing?: Underwear/pull up,Pants     Lower body assist Assist for lower body dressing: Moderate Assistance - Patient 50 - 74%      Toileting Toileting    Toileting assist Assist for toileting: Maximal Assistance - Patient 25 - 49%     Transfers Chair/bed transfer  Transfers assist     Chair/bed transfer assist level: Contact Guard/Touching assist Chair/bed transfer assistive device: Walker   Locomotion Ambulation   Ambulation assist   Ambulation activity did not occur: Safety/medical concerns  Assist level: Moderate Assistance - Patient 50 - 74%   Max distance: 2-3'   Walk 10 feet activity   Assist  Walk 10 feet activity did not occur: Safety/medical concerns        Walk 50 feet activity   Assist Walk 50 feet with 2 turns activity did not occur: Safety/medical concerns         Walk 150 feet activity   Assist Walk 150 feet activity did not occur: Safety/medical concerns         Walk 10 feet on uneven surface  activity   Assist Walk 10 feet on uneven surfaces activity did not occur: Safety/medical concerns         Wheelchair     Assist Will patient use wheelchair at discharge?: Yes Type of Wheelchair: Manual    Wheelchair assist level: Supervision/Verbal cueing Max wheelchair distance: 150    Wheelchair 50 feet with 2 turns activity    Assist        Assist Level: Supervision/Verbal cueing   Wheelchair 150 feet activity     Assist      Assist Level: Supervision/Verbal cueing   Blood pressure (!) 124/59, pulse 72, temperature 97.8 F (36.6 C), temperature source Oral, resp. rate 17, height '5\' 3"'$  (1.6 m), weight 103.3 kg, SpO2 94 %.  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 -patient may showerif residual limb is water-proofed -ELOS/Goals: 7-10 days, Mod I at w/c level  -Continue CIR 2. Antithrombotics: -DVT/anticoagulation:Pharmaceutical:Continue Heparin -antiplatelet therapy: ASA/Plavix 3. Pain  Management:ContinueOxycodone prn--received 2 doses 2/20  2/21- having more phantom pain than anything- on no phantom pain meds- will start Gabapentin 300 mg QHS for now- since on HD- max dose we can usually use.   2/22- pain better today- con't regimen  2/24- pt reports some early AM hallucinations- will change to Norco for pain- if doesn't improve, will need to decrease Gabapentin dose 4. Mood:LCSW to follow for evaluation and support. -antipsychotic agents: N/A 5. Neuropsych: This patientiscapable of making decisions on herown behalf. 6. Skin/Wound Care:Protein supplement for hypoalbuminemia  --monitor wound for healing.  -apply dry dressing and ACE to residual limb -apply small amount of gauze to protect gangrenous second toe which is about to auto-amputate   2/22- L BKA looking better- healing; stable in RLE/toes-  doesn't appear to be source of Leukocytosis 7. Fluids/Electrolytes/Nutrition:Strict I/O. Labs with HD. 1200cc/FR. 8. ESRD: Schedule HD at the end of the day on MWF to help with tolerance of therapy.  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   -Cr increased to 11.13 on 2/19- appreciate management by nephrology 9. T2DM with nephropathy: Hgb A1c- 6.4 on last check. Was on Lantus 123XX123 units with Trulicity 1.5 mg/wk.  --Monitor BS ac/hs and use SSI for elevated BS --Continue to titrate Lantus to home dose as indicated  2/21- BGs 118-137- controlled overall- con't regimen  2/24- BGs 107-145- doing well- con't regimen 10 HTN: Monitor BP tid. Has been labile --Continue Metoprolol and amlodipine. 11. PVD: Continue Plavix, ASA and Lipitor 12. Morbid obesity: BMI 39.01. --educate on diet as well as importance of weight loss to help promote health and mobility.  13. Anemia of chronic disease: Being managed with weekly Aranesp.  14.  Thrombocytosis: Continue to trend--improved from peak of 774-->600 15. Leukocytosis: discussed with patient that WBC is elevated. She is asymptomatic and without fever, repeat with next dialysis labs to trend  2/21- WBC up to 15k-will check U/A and Cx since pt is oliguric, not anuric- most likely cause vs her dry gangrene of R foot/toes- will reassess in Am and recheck CBC/renal fxn panel in AM- even though it's not at HD, need the results.   2/22- WBC down to 13.3k - U/A and Cx is pending. D/w nursing  2/24- WBC went up to 15.6 yesterday- back down to 13k today/this AM- will do additional ID w/u including blood Cx's.  16. Hyperkalemia/Hyponatremia  2/21- will d/w renal- NA down to 126 and K+ up to 5.6.   2/22- K+ down to 4.0 and Na up to 130- con't per renal  2/24- NA 129- up from 124 and K+ is 4.3- per renal.   LOS: 6 days A FACE TO FACE EVALUATION WAS PERFORMED  Christy Zhang 06/22/2020, 5:18 PM

## 2020-06-22 NOTE — Progress Notes (Signed)
Physical Therapy Session Note  Patient Details  Name: FRANKI SKOCZYLAS MRN: [id number] Date of Birth: 1956/02/22  Today's Date: 06/22/2020 PT Individual Time: 0800-0900 PT Individual Time Calculation (min): 60 min   Short Term Goals: Week 1:  PT Short Term Goal 1 (Week 1): STG=LTG due to ELOS  Skilled Therapeutic Interventions/Progress Updates:    Patient received sitting up in bed, agreeable to PT. She denies pain. PT observing blood on sheets/blankets, brief and soaked through gown. Blood appears to be coming from injection site in RLQ. PT assisting patient in changing brief and gown with ModA. She was able to come sit edge of bed with supervision and transfer to wc via stand pivot with CGA and RW. Patient requesting that PT give RN meds that dtr brought in from home- RN aware of home meds as well as blood. She was able to propel herself in wc to therapy gym [id number] with supervision and transfer to therapy mat via stand pivot with RW and CGA. Sidelying L hip extension 3x12, sidelying L hip abduction 3x12. PT re-wrapping limb. Upon patient sitting up, PT noted blood on new gown- RLQ bleed had soaked through brief and new gown. Return to room in wc to address bleeding site. RN aware and requested that PT place gauze over site. Gauze + paper tape applied to assist in managing bleeding. Patient remaining up in wc, seatbelt alarm on, call light within reach.   Therapy Documentation Precautions:  Precautions Precautions: Fall Precaution Comments: falls, R 2nd toe wound Required Braces or Orthoses: Knee Immobilizer - Left Knee Immobilizer - Left: On at all times Restrictions Weight Bearing Restrictions: Yes LLE Weight Bearing: Non weight bearing    Therapy/Group: Individual Therapy  JKaroline Caldwell PT, DPT, CBIS  06/22/2020, 7:42 AM

## 2020-06-22 NOTE — Progress Notes (Signed)
Physical Therapy Session Note  Patient Details  Name: Christy Zhang MRN: [id number] Date of Birth: 04/12/1956  Today's Date: 06/22/2020 PT Individual Time: 0931-1000 and 1102-1200 PT Individual Time Calculation (min): 29 min and 58 min   Short Term Goals: Week 1:  PT Short Term Goal 1 (Week 1): STG=LTG due to ELOS   Skilled Therapeutic Interventions/Progress Updates:  Session 1: Patient seated in w/c upon PT arrival. Patient alert and agreeable to PT session. Patient denied pain during session. Pt does state that a place at her abdomen continues to bleed from a shot she received early this morning. Pointed to a bandage that was bright red with blood. Indicated to RN who replaced the bandage with new gauze and tegaderm film.  Therapeutic Activity: Transfers: Patient performed STS and SPVT transfers throughout this session using RW with supervision/ CGA. Provided verbal cues for hand/ foot placement and forward lean. Pt also able to perform squat pivot transfer bed <> w/c  With supervision. Lateral scooting along bed performed with setup.   Pt guided in standing heel raises using RW and performed 2x10 reps.   Patient seated in w/c at end of session with brakes locked, belt alarm set, and all needs within reach.   Session 2: Patient seated upright in w/c upon PT arrival. Patient alert and agreeable to PT session. Patient denied pain during session.  Therapeutic Activity: Transfers: Patient performed STS to RW and STS to // bars with supervision and SPVT w/c <> mat table with close supervision. Provided verbal cues for R foot placement, weight shifting.  Pt guided in abdominal strengthening with use of theraball while seated at mat table. Pt is able to perform 2 x 10 reps with rest break btween sets. Hold of thighs to mat table to limit compensatory build of momentum. Pt also performs 2x10 reps of bridging with use of bolster under Bil thighs. VC for glute squeezes.   Gait Training:   D/t pt's earlier performance with heel raises and with R foot shifting during SPVT. In // bars, pt amb 8' x2 with supervision. Provided verbal cues for WB through BUE during hop-step. Pt states feeling more stable with bars than with use of RW.   Pt guided in amb using RW to cover 4' x1 with cone used for target goal for pt. Completed with hop-step gait pattern and pt getting tired ~3 feet.   Patient seated upright in w/c at end of session with brakes locked, belt alarm set, and all needs within reach prior to lunch arriving.  Therapy Documentation Precautions:  Precautions Precautions: Fall Precaution Comments: falls, R 2nd toe wound Required Braces or Orthoses: Knee Immobilizer - Left Knee Immobilizer - Left: On at all times Restrictions Weight Bearing Restrictions: Yes LLE Weight Bearing: Non weight bearing  Therapy/Group: Individual Therapy  Alger Simons 06/22/2020, 7:00 PM

## 2020-06-22 NOTE — Progress Notes (Addendum)
Pt daughter brought in med from home. Trulicity 1.'5mg'$ /0.23m (2 pens) SQ every Saturday. Will notify PA of medication from home.  Took home med to pharmacy, was informed that medication may be too warm. She will deliver to RPh to evaluate and if unable to use, will let u[place]ore[place]now

## 2020-06-22 NOTE — Progress Notes (Signed)
[facility] Progress Note   Subjective:   Patient seen and examined in room.  Sitting in wheelchair.  No specific complaints.  Denies CP, SOB, n/v/d, abdominal pain and fatigue.  Objective Vitals:   06/21/20 1650 06/21/20 1718 06/21/20 1949 06/22/20 0458  BP: 131/63 (!) 128/56 (!) 148/60 (!) 141/60  Pulse: (!) 59 64 68 69  Resp: '18 17 18 20  '$ Temp: 97.8 F (36.6 C) 98.7 F (37.1 C) 98.3 F (36.8 C) 98.4 F (36.9 C)  TempSrc: Oral Oral Oral Oral  SpO2: 98% 100% 96% 95%  Weight: 102.5 kg   103.3 kg  Height:       Physical Exam General:well developed female in NAD Heart:RRR, no mrg Lungs:CTAB, nml WOB on RA Abdomen:soft, NTND Extremities:no LE edema, L BKA Dialysis Access: RU AVG +b/t   Filed Weights   06/21/20 1325 06/21/20 1650 06/22/20 0458  Weight: 105.7 kg 102.5 kg 103.3 kg    Intake/Output Summary (Last 24 hours) at 06/22/2020 1002 Last data filed at 06/22/2020 0741 Gross per 24 hour  Intake 417 ml  Output 3000 ml  Net -2583 ml    Additional Objective Labs: Basic Metabolic Panel: Recent Labs  Lab 06/20/20 0521 06/21/20 1426 06/22/20 0506  NA 130* 124* 129*  K 4.0 4.6 4.3  CL 94* 86* 93*  CO2 '24 22 26  '$ GLUCOSE 117* 158* 106*  BUN 41* 59* 38*  CREATININE 7.09* 9.33* 6.82*  CALCIUM 8.3* 8.8* 8.3*  PHOS 4.6 5.6* 4.0   Liver Function Tests: Recent Labs  Lab 06/20/20 0521 06/21/20 1426 06/22/20 0506  ALBUMIN 2.4* 2.6* 2.4*   CBC: Recent Labs  Lab 06/17/20 0728 06/19/20 1413 06/20/20 0521 06/21/20 1426 06/22/20 0506  WBC 13.1* 15.0* 13.3* 15.6* 13.2*  NEUTROABS  --   --  9.4*  --  9.4*  HGB 7.9* 7.6* 7.6* 8.0* 7.6*  HCT 25.6* 24.1* 23.9* 25.1* 24.1*  MCV 81.8 81.1 81.3 81.8 82.8  PLT 646* 646* 623* 738* 648*   Blood Culture    Component Value Date/Time   SDES URINE, RANDOM 06/20/2020 0921   SPECREQUEST NONE 06/20/2020 0921   CULT  06/20/2020 0921    NO GROWTH Performed at [facility], [place] [address],  [place], [place] [address]    REPTSTATUS 06/21/2020 FINAL 06/20/2020 0921   CBG: Recent Labs  Lab 06/20/20 1948 06/21/20 0835 06/21/20 1220 06/21/20 1722 06/22/20 0459  GLUCAP 143* 141* 134* 145* 107*   Iron Studies:  Recent Labs    06/20/20 0521  IRON 38  TIBC 161*  FERRITIN 883*   Lab Results  Component Value Date   INR 1.2 06/08/2020   Studies/Results: No results found.  Medications: . ferric gluconate (FERRLECIT/NULECIT) IV 125 mg (06/21/20 1550)   . (feeding supplement) PROSource Plus  30 mL Oral BID BM  . amLODipine  10 mg Oral QHS  . aspirin EC  81 mg Oral QHS  . atorvastatin  40 mg Oral QHS  . Chlorhexidine Gluconate Cloth  6 each Topical Q0600  . Chlorhexidine Gluconate Cloth  6 each Topical Q0600  . clopidogrel  75 mg Oral Daily  . darbepoetin (ARANESP) injection - DIALYSIS  60 mcg Intravenous Q Fri-HD  . doxercalciferol  1.5 mcg Intravenous Q M,W,F-HD  . Dulaglutide  1.5 mg Subcutaneous Weekly  . gabapentin  300 mg Oral QHS  . heparin  5,000 Units Subcutaneous Q8H  . insulin glargine  20 Units Subcutaneous Daily  . lanthanum  1,000  mg Oral TID WC  . metoprolol succinate  50 mg Oral Daily  . multivitamin  1 tablet Oral QHS  . polyethylene glycol  17 g Oral Daily  . Ensure Max Protein  11 oz Oral Daily  . senna-docusate  1 tablet Oral BID  . sevelamer carbonate  2,400 mg Oral TID WC    Dialysis Orders: [facility] MWF 4hrs 2K 2.5Ca EDW 101kg RU AVG Heparin 1000 load and 1000 units qhr EPO 14000 units qHD Venofer '100mg'$  qHD Hectorol 1.57mg qHD   Assessment/Plan: 1. 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 Leukocytosis -up and down. 13.2 today. Asymptomatic. BC and UC negative.  3. ESRD-on MWF.K 4.3. HD today per regular schedule.  4. HTN/ volume -BPmildly elevated.Continue amlodipineand metoprolol.  May need to increase metoprolol if continues to be elevated.Does not appear grossly volume overloaded.UF as  tolerated 5. DM2 - peradmitting team. 6. Anemia ckd -Hgbincreased to 8.0 today.OnAranesp 60qwk with last on2/19. Tsat 24% - start short Fe course. 7. MBD ckd -Calciumand phos in goal.Continue renvela and VDRA.  8. Nutrition - Renal diet with fluid restrictions. Protein supplements, Vitamins.   LJen Mow PA-C [facility] 06/22/2020,10:02 AM  LOS: 6 days

## 2020-06-22 NOTE — Progress Notes (Signed)
Patient with elevated WBC--CXR, BC ordered for work up per Dr. Dagoberto Ligas. L-BKA incision healing well with sutures and staples in place--lateral aspect with mild dehiscence and was steri stripped. She also reports some hallucinations when awakening--transient and she knows that it's not real--discussed that this could be med related. Will change oxycodone (uses bid on average) to hydrocodone prn. Marland Kitchen

## 2020-06-22 NOTE — Progress Notes (Signed)
Occupational Therapy Session Note  Patient Details  Name: Christy Zhang MRN: [id number] Date of Birth: Oct 14, 1955  Today's Date: 06/22/2020 OT Individual Time: 1018-1100 OT Individual Time Calculation (min): 42 min    Short Term Goals: Week 1:  OT Short Term Goal 1 (Week 1): Pt will complete BSC transfer with CGA + AE PRN OT Short Term Goal 2 (Week 1): Pt will complete standing bimanual ADL  for >2 min with CGA + AE PRN. OT Short Term Goal 3 (Week 1): Pt will don shirt with close S.   Skilled Therapeutic Interventions/Progress Updates:    Pt greeted at time of session sitting up in wheelchair agreeable to OT session, needing to use the bathroom. Self propel to bathroom, stand pivot with grab bar Min A to BSC over toilet (lowered to better height for her) and assisted with unilateral support on grab bar to doff brief with Mod A. P{t with (+) BM and max a for hygiene in standing, discussed ways to lateral lean on BSC for hygiene but prefers to stand. Stand pivot back to wheelchair Min A and set up at sink for UB/LB bathing with Mod A for LB. Donned brief and pants with Max A overall and donned gown with Min A. Educated on AE to compensate and reach RLE. Note pt did have bleeding at injection site from Lovenox, RN aware. Discussed techniques at home to maximize indep with self care with daughter assist. Pt up in wheelchair with alarm on call bell in reach.    Therapy Documentation Precautions:  Precautions Precautions: Fall Precaution Comments: falls, R 2nd toe wound Required Braces or Orthoses: Knee Immobilizer - Left Knee Immobilizer - Left: On at all times Restrictions Weight Bearing Restrictions: Yes LLE Weight Bearing: Non weight bearing     Therapy/Group: Individual Therapy  Viona Gilmore 06/22/2020, 10:47 AM

## 2020-06-22 NOTE — Progress Notes (Signed)
Pt going to xray, pt unable to maintain balance when transferring from w/c to bed via RW, pt did not hit floor, however she was unable to move back into w/c. Transferred pt from w/c using steady without difficulty, pt reported no pain but not sure if residual limb touch floor. Will check when pt returns from xray.

## 2020-06-22 NOTE — Progress Notes (Signed)
Pt has been bleeding from injection of heparin to right abdomen. Have applied several dressing to area along with pressure with each dressing lasting about an hour. Contacted PA, will apply pressure dressing to area and instructed to hold heparin for now and restart at 6am dose

## 2020-06-23 LAB — CBC
HCT: 24.1 % — ABNORMAL LOW (ref 36.0–46.0)
Hemoglobin: 7.3 g/dL — ABNORMAL LOW (ref 12.0–15.0)
MCH: 25.4 pg — ABNORMAL LOW (ref 26.0–34.0)
MCHC: 30.3 g/dL (ref 30.0–36.0)
MCV: 84 fL (ref 80.0–100.0)
Platelets: 612 10*3/uL — ABNORMAL HIGH (ref 150–400)
RBC: 2.87 MIL/uL — ABNORMAL LOW (ref 3.87–5.11)
RDW: 17.7 % — ABNORMAL HIGH (ref 11.5–15.5)
WBC: 14.9 10*3/uL — ABNORMAL HIGH (ref 4.0–10.5)
nRBC: 0 % (ref 0.0–0.2)

## 2020-06-23 LAB — RENAL FUNCTION PANEL
Albumin: 2.5 g/dL — ABNORMAL LOW (ref 3.5–5.0)
Anion gap: 13 (ref 5–15)
BUN: 60 mg/dL — ABNORMAL HIGH (ref 8–23)
CO2: 23 mmol/L (ref 22–32)
Calcium: 8.6 mg/dL — ABNORMAL LOW (ref 8.9–10.3)
Chloride: 90 mmol/L — ABNORMAL LOW (ref 98–111)
Creatinine, Ser: 9.28 mg/dL — ABNORMAL HIGH (ref 0.44–1.00)
GFR, Estimated: 4 mL/min — ABNORMAL LOW (ref >60)
Glucose, Bld: 124 mg/dL — ABNORMAL HIGH (ref 70–99)
Phosphorus: 5.1 mg/dL — ABNORMAL HIGH (ref 2.5–4.6)
Potassium: 4.9 mmol/L (ref 3.5–5.1)
Sodium: 126 mmol/L — ABNORMAL LOW (ref 135–145)

## 2020-06-23 LAB — GLUCOSE, CAPILLARY
Glucose-Capillary: 123 mg/dL — ABNORMAL HIGH (ref 70–99)
Glucose-Capillary: 115 mg/dL — ABNORMAL HIGH (ref 70–99)

## 2020-06-23 MED ORDER — DOXERCALCIFEROL 4 MCG/2ML IV SOLN
INTRAVENOUS | Status: AC
  Administered 2020-06-23: 1.5 ug via INTRAVENOUS
  Filled 2020-06-23: qty 2

## 2020-06-23 MED ORDER — DARBEPOETIN ALFA 60 MCG/0.3ML IJ SOSY
PREFILLED_SYRINGE | INTRAMUSCULAR | Status: AC
  Administered 2020-06-23: 100 ug via INTRAVENOUS
  Filled 2020-06-23: qty 0.3

## 2020-06-23 MED ORDER — DARBEPOETIN ALFA 100 MCG/0.5ML IJ SOSY: 100.0000 ug | PREFILLED_SYRINGE | INTRAMUSCULAR | Status: DC

## 2020-06-23 MED ORDER — DARBEPOETIN ALFA 100 MCG/0.5ML IJ SOSY
PREFILLED_SYRINGE | INTRAMUSCULAR | Status: AC
  Filled 2020-06-23: qty 0.5

## 2020-06-23 NOTE — Progress Notes (Signed)
Occupational Therapy Session Note  Patient Details  Name: Christy Zhang MRN: [id number] Date of Birth: 07-Jul-1955  Today's Date: 06/23/2020 OT Individual Time: [id number] OT Individual Time Calculation (min): 57 min   Short Term Goals: Week 1:  OT Short Term Goal 1 (Week 1): Pt will complete BSC transfer with CGA + AE PRN OT Short Term Goal 2 (Week 1): Pt will complete standing bimanual ADL  for >2 min with CGA + AE PRN. OT Short Term Goal 3 (Week 1): Pt will don shirt with close S.      Skilled Therapeutic Interventions/Progress Updates:    Pt greeted in the w/c via PT handoff. Pt c/o Lt shoulder pain due to fall with staff yesterday. She reported being premedicated for this. Started session with bathing/dressing tasks w/c level sit<stand at the sink. Pt reports that her bathroom is not w/c accessible at home, aware that she will have to set herself up with sponge bathing at home (most likely in the kitchen) until f/u OT deems showering safe. Discussed proper hand + foot placement during sit<stand transitions, elevating armrest to complete frontal pericare while seated, and also using AE to increase her functional independence with dressing tasks. Min A for donning overhead shirt and Mod A for donning pull up due to Lt arm pain. Before donning skirt, pt reported needing to urgently use the bathroom. CGA for stand pivot<elevated toilet using the BSC. Pt had B+B void. We lowered the drop arm BSC component so that she could complete frontal perhygiene. CGA for sit<stand using RW in order for OT to assist with hygiene in the back. Vcs beforehand for foot and hand placement. Note that pt had 1 LOB while standing with bilateral UE supported on the walker, Min A to assist with returning to seated position for safety. Pt reports thinking she overdid it during therapies yesterday, stated "I did more yesterday than I ever did in my entire life." Max A for donning brief in standing and Mod A for squat  pivot<w/c. Pt then completed hand washing and oral care while sitting at the sink. She used the reacher to don her skirt. Assistance needed for elevating skirt over hips in standing. Pt remained sitting up in the w/c at end of session, all needs within reach and safety belt fastened. Tx focus placed on adaptive self care skills, functional transfers, sit<stands, and pt education/d/c planning.  Per PT, ok to remove KI during session. OT rewrapped residual limb  Therapy Documentation Precautions:  Precautions Precautions: Fall Precaution Comments: falls, R 2nd toe wound Required Braces or Orthoses: Knee Immobilizer - Left Knee Immobilizer - Left: On at all times Restrictions Weight Bearing Restrictions: Yes LLE Weight Bearing: Non weight bearing ADL: ADL Eating: Set up Where Assessed-Eating: Wheelchair Grooming: Setup Where Assessed-Grooming: Wheelchair Upper Body Bathing: Supervision/safety Where Assessed-Upper Body Bathing: Wheelchair Lower Body Bathing: Moderate assistance Where Assessed-Lower Body Bathing: Wheelchair Upper Body Dressing: Minimal assistance Where Assessed-Upper Body Dressing: Wheelchair Lower Body Dressing: Moderate assistance Where Assessed-Lower Body Dressing: Wheelchair Toileting: Moderate assistance Where Assessed-Toileting: Bedside Commode Toilet Transfer: Moderate assistance Toilet Transfer Method: Stand pivot Toilet Transfer Equipment: Radiographer, therapeutic: Not assessed Social research officer, government: Not assessed (Does not shower at baseline)      Therapy/Group: Individual Therapy  Morris Markham A Ardythe Klute 06/23/2020, 12:46 PM

## 2020-06-23 NOTE — Progress Notes (Signed)
Physical Therapy Session Note  Patient Details  Name: Christy Zhang MRN: [id number] Date of Birth: 12-25-1955  Today's Date: 06/23/2020 PT Individual Time: 1000-1100 PT Individual Time Calculation (min): 60 min   Short Term Goals: Week 1:  PT Short Term Goal 1 (Week 1): STG=LTG due to ELOS  Skilled Therapeutic Interventions/Progress Updates:    pt received in [facility] and agreeable to therapy. As PT entered room, pt immediately reported she had fallen with nursing yesterday (2/24) evening transferring from [facility] to bed with RW. Pt reports she doesn't fully know what happened but that she lost her balance or her knee buckled and she was unable to hold herself upright, she reports that per nursing she did not hit the floor but with assisting in bringing self back to [facility] her L shoulder is now hurting with limited pain free ROM. Pt reported 4/10 at L shoulder pain and pt able to bring arm to 90 degrees shoulder flexion with 4/10 pain then if she attempts to bring it over this she reports greatly increased pain. Pt taken off unit to first floor to participate in [facility] mobility in open environment, with emphasis on changing directions and turning. Pt taken off unit with nursing aware and agreeable. Pt then directed in [facility] mobility for 120' +100'+ 75'+ 100' with prolonged rest breaks between all reps, grossly CGA for this with decreased pace overall and VC for technique. Pt then returned to unit, directed in seated BLE strengthening exercises green TB 2x20 hip[ flexion, hip abduction/adduction, and LAQ on RLE and 2x20 glute squeezes. Pt then taken back to room, total A for time and energy. Stand pivot transfer with RW min A to bedside and CGA to supine. Pt left in bed, alarm set, All needs in reach and in good condition. Call light in hand.   Therapy Documentation Precautions:  Precautions Precautions: Fall Precaution Comments: falls, R 2nd toe wound Required Braces or Orthoses: Knee Immobilizer - Left Knee  Immobilizer - Left: On at all times Restrictions Weight Bearing Restrictions: Yes LLE Weight Bearing: Non weight bearing General:   Vital Signs: Therapy Vitals Temp: 97.8 F (36.6 C) Temp Source: Oral Pulse Rate: 66 Resp: 18 BP: 125/61 Patient Position (if appropriate): Lying Oxygen Therapy SpO2: 97 % O2 Device: Room Air Pain: Pain Assessment Pain Scale: 0-10 Mobility:   Locomotion :    Trunk/Postural Assessment :    Balance:   Exercises:   Other Treatments:      Therapy/Group: Individual Therapy  [name] 06/23/2020, 3:52 PM

## 2020-06-23 NOTE — Progress Notes (Addendum)
[facility] Progress Note   Subjective:   Patient seen and examined at bedside in dialysis.  Reports she fell yesterday when trying to transfer but denies injury.  States he L arm is a little sore and she feels embarrassed.  Denies stump pain, n/v/d, abdominal pain, CP and SOB.    Objective Vitals:   06/23/20 0406 06/23/20 0410 06/23/20 1415 06/23/20 1430  BP: (!) 147/67  131/64 131/62  Pulse: 67  70 66  Resp: 20     Temp: 98.4 F (36.9 C)     TempSrc: Oral     SpO2: 94%     Weight:  104.9 kg    Height:       Physical Exam General:chronically ill appearing female in NAD Heart:RRR Lungs:CTAB Abdomen:soft, NTND Extremities:no LE edema Dialysis Access: RU AVG +b/t   Filed Weights   06/21/20 1650 06/22/20 0458 06/23/20 0410  Weight: 102.5 kg 103.3 kg 104.9 kg    Intake/Output Summary (Last 24 hours) at 06/23/2020 1441 Last data filed at 06/23/2020 0735 Gross per 24 hour  Intake 678 ml  Output --  Net 678 ml    Additional Objective Labs: Basic Metabolic Panel: Recent Labs  Lab 06/20/20 0521 06/21/20 1426 06/22/20 0506  NA 130* 124* 129*  K 4.0 4.6 4.3  CL 94* 86* 93*  CO2 '24 22 26  '$ GLUCOSE 117* 158* 106*  BUN 41* 59* 38*  CREATININE 7.09* 9.33* 6.82*  CALCIUM 8.3* 8.8* 8.3*  PHOS 4.6 5.6* 4.0   Liver Function Tests: Recent Labs  Lab 06/20/20 0521 06/21/20 1426 06/22/20 0506  ALBUMIN 2.4* 2.6* 2.4*   CBC: Recent Labs  Lab 06/19/20 1413 06/20/20 0521 06/21/20 1426 06/22/20 0506 06/23/20 1239  WBC 15.0* 13.3* 15.6* 13.2* 14.9*  NEUTROABS  --  9.4*  --  9.4*  --   HGB 7.6* 7.6* 8.0* 7.6* 7.3*  HCT 24.1* 23.9* 25.1* 24.1* 24.1*  MCV 81.1 81.3 81.8 82.8 84.0  PLT 646* 623* 738* 648* 612*   CBG: Recent Labs  Lab 06/21/20 1220 06/21/20 1722 06/22/20 0459 06/22/20 1729 06/23/20 0505  GLUCAP 134* 145* 107* 150* 123*   Studies/Results: DG Chest 2 View  Result Date: 06/22/2020 CLINICAL DATA:  Elevated WBC count. No chest  complaints. Hx of CHF, asthma, CKD, ESRD, AND HTN. Pt is a former smoker. EXAM: CHEST - 2 VIEW COMPARISON:  Chest radiograph 08/25/2014 FINDINGS: Vascular graft noted in the right upper chest. Heart size is enlarged. The lungs are clear. No pneumothorax or pleural effusion. Degenerative changes are noted in the thoracic spine. IMPRESSION: Cardiomegaly.  No evidence of active disease. Electronically Signed   By: Audie Pinto M.D.   On: 06/22/2020 16:59    Medications: . ferric gluconate (FERRLECIT/NULECIT) IV 125 mg (06/21/20 1550)   . (feeding supplement) PROSource Plus  30 mL Oral BID BM  . amLODipine  10 mg Oral QHS  . aspirin EC  81 mg Oral QHS  . atorvastatin  40 mg Oral QHS  . Chlorhexidine Gluconate Cloth  6 each Topical Q0600  . Chlorhexidine Gluconate Cloth  6 each Topical Q0600  . Chlorhexidine Gluconate Cloth  6 each Topical Q0600  . clopidogrel  75 mg Oral Daily  . darbepoetin (ARANESP) injection - DIALYSIS  60 mcg Intravenous Q Fri-HD  . doxercalciferol  1.5 mcg Intravenous Q M,W,F-HD  . Dulaglutide  1.5 mg Subcutaneous Weekly  . gabapentin  300 mg Oral QHS  . heparin  5,000 Units Subcutaneous Q8H  .  insulin glargine  20 Units Subcutaneous Daily  . lanthanum  1,000 mg Oral TID WC  . metoprolol succinate  50 mg Oral Daily  . multivitamin  1 tablet Oral QHS  . polyethylene glycol  17 g Oral Daily  . Ensure Max Protein  11 oz Oral Daily  . senna-docusate  1 tablet Oral BID  . sevelamer carbonate  2,400 mg Oral TID WC    Dialysis Orders: [facility] MWF 4hrs 2K 2.5Ca EDW 101kg RU AVG Heparin 1000 load and 1000 units qhr EPO 14000 units qHD Venofer '100mg'$  qHD Hectorol 1.64mg qHD   Assessment/Plan: 1. 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 Leukocytosis -up and down. 14.9today. Asymptomatic. BCand UCnegative.  3. ESRD-on MWF.last K 4.3.HD today per regular schedule. 4. HTN/ volume -BPmildly elevated.Continue amlodipineand  metoprolol.  May need to increase metoprolol if continues to be elevated.Does not appear grossly volume overloaded.UF as tolerated. Dry wt should drop 2-3 kg after a BKA. May need ^UF on HD.  5. DM2 - peradmitting team. 6. Anemia ckd -Hgbup and down, 7.3 today.Increase Aranesp to 100qwk. Tsat 24% - ordered short Fe course. 7. MBD ckd -Calciumand phos in goal.Continue renvela and VDRA.  8. Nutrition - Renal diet with fluid restrictions. Protein supplements, Vitamins.   LJen Mow PA-C [facility] 06/23/2020,2:41 PM  LOS: 7 days   Pt seen, examined and agree w assess/plan as above with additions as indicated.  [facility] 06/23/2020, 4:19 PM

## 2020-06-23 NOTE — Progress Notes (Signed)
PROGRESS NOTE   Subjective/Complaints:   Pt reports she's embarrassed about having assisted fall yesterday- is sore esp in LUE from it, but not actually "painful".  Said thought BP was low? Nnot sure.  Norco helping pain well- doesn't need Oxy at this time.  LBM yesterday Having oozing from heparin shot injections on abdomen.    ROS:   Pt denies SOB, abd pain, CP, N/V/C/D, and vision changes   Objective:   DG Chest 2 View  Result Date: 06/22/2020 CLINICAL DATA:  Elevated WBC count. No chest complaints. Hx of CHF, asthma, CKD, ESRD, AND HTN. Pt is a former smoker. EXAM: CHEST - 2 VIEW COMPARISON:  Chest radiograph 08/25/2014 FINDINGS: Vascular graft noted in the right upper chest. Heart size is enlarged. The lungs are clear. No pneumothorax or pleural effusion. Degenerative changes are noted in the thoracic spine. IMPRESSION: Cardiomegaly.  No evidence of active disease. Electronically Signed   By: Audie Pinto M.D.   On: 06/22/2020 16:59   Recent Labs    06/22/20 0506 06/23/20 1239  WBC 13.2* 14.9*  HGB 7.6* 7.3*  HCT 24.1* 24.1*  PLT 648* 612*   Recent Labs    06/22/20 0506 06/23/20 1226  NA 129* 126*  K 4.3 4.9  CL 93* 90*  CO2 26 23  GLUCOSE 106* 124*  BUN 38* 60*  CREATININE 6.82* 9.28*  CALCIUM 8.3* 8.6*    Intake/Output Summary (Last 24 hours) at 06/23/2020 1801 Last data filed at 06/23/2020 0735 Gross per 24 hour  Intake 678 ml  Output --  Net 678 ml        Physical Exam: Vital Signs Blood pressure 135/61, pulse 68, temperature 98.5 F (36.9 C), temperature source Oral, resp. rate 18, height '5\' 3"'$  (1.6 m), weight 101.4 kg, SpO2 97 %. Gen: sitting up in bed; appropriate, but appears embarrassed, NAD- rubbing LUE HENT: oral mucosa pink and moist, NCAT, Missing numerous teeth- conjugate gaze-  Cardio:RRR- no JVD Chest: CTA B/L- no W/R/R- good air movement Abd: Soft, NT, ND, (+)BS   Ext:  no edema in RLE- has dry gangrene in toes Psych: embarrassed looking Skin: intact Musculoskeletal:  Cervical back: Normal range of motion.  Comments: L BKA has retention sutures and staples- a little sanguinous drainage on dressing, nothing active- looks good- no dog ears, swelling improved- wearing ACE wraps and KI- looks the same as Tuesday except slightly open on 1 side- steristrips placed Skin: General: Skin is warm.  Comments: Chronic changes in RLE, 3rd digit wrapped in kerlex; 2nd and 4th toe have dry gangrene Skin very dry in foot/leg. Neuro: Ox3  Cranial nerve exam unremarkable. UE motor 4+ to 5/5. RLE: 3+/5 HF, 4/5 KE and 3-4/5 ADF/PF. LLE: able to lift leg off bed about 6". Decreased LT RLE distally.      Assessment/Plan: 1. Functional deficits which require 3+ hours per day of interdisciplinary therapy in a comprehensive inpatient rehab setting.  Physiatrist is providing close team supervision and 24 hour management of active medical problems listed below.  Physiatrist and rehab team continue to assess barriers to discharge/monitor patient progress toward functional and medical goals  Care Tool:  Bathing  Body parts bathed by patient: Face,Right arm,Chest,Left arm,Abdomen,Right upper leg,Front perineal area,Left upper leg   Body parts bathed by helper: Buttocks,Right lower leg Body parts n/a: Left lower leg   Bathing assist Assist Level: Moderate Assistance - Patient 50 - 74%     Upper Body Dressing/Undressing Upper body dressing   What is the patient wearing?: Pull over shirt    Upper body assist Assist Level: Minimal Assistance - Patient > 75%    Lower Body Dressing/Undressing Lower body dressing      What is the patient wearing?: Skirt,Underwear/pull up     Lower body assist Assist for lower body dressing: Moderate Assistance - Patient 50 - 74%     Toileting Toileting    Toileting assist Assist for toileting: Maximal Assistance -  Patient 25 - 49%     Transfers Chair/bed transfer  Transfers assist     Chair/bed transfer assist level: Contact Guard/Touching assist Chair/bed transfer assistive device: Programmer, multimedia   Ambulation assist   Ambulation activity did not occur: Safety/medical concerns  Assist level: Contact Guard/Touching assist Assistive device: Parallel bars Max distance: 8'   Walk 10 feet activity   Assist  Walk 10 feet activity did not occur: Safety/medical concerns        Walk 50 feet activity   Assist Walk 50 feet with 2 turns activity did not occur: Safety/medical concerns         Walk 150 feet activity   Assist Walk 150 feet activity did not occur: Safety/medical concerns         Walk 10 feet on uneven surface  activity   Assist Walk 10 feet on uneven surfaces activity did not occur: Safety/medical concerns         Wheelchair     Assist Will patient use wheelchair at discharge?: Yes Type of Wheelchair: Manual    Wheelchair assist level: Supervision/Verbal cueing Max wheelchair distance: 150    Wheelchair 50 feet with 2 turns activity    Assist        Assist Level: Supervision/Verbal cueing   Wheelchair 150 feet activity     Assist      Assist Level: Supervision/Verbal cueing   Blood pressure 135/61, pulse 68, temperature 98.5 F (36.9 C), temperature source Oral, resp. rate 18, height '5\' 3"'$  (1.6 m), weight 101.4 kg, SpO2 97 %.  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 -patient may showerif residual limb is water-proofed -ELOS/Goals: 7-10 days, Mod I at w/c level  -Continue CIR 2. Antithrombotics: -DVT/anticoagulation:Pharmaceutical:Continue Heparin -antiplatelet therapy: ASA/Plavix 3. Pain Management:ContinueOxycodone prn--received 2 doses 2/20  2/21- having more phantom  pain than anything- on no phantom pain meds- will start Gabapentin 300 mg QHS for now- since on HD- max dose we can usually use.   2/22- pain better today- con't regimen  2/24- pt reports some early AM hallucinations- will change to Norco for pain- if doesn't improve, will need to decrease Gabapentin dose  2/25- doesn't report any hallucinations and Norco working well for her- for pain control- con't regimen 4. Mood:LCSW to follow for evaluation and support. -antipsychotic agents: N/A 5. Neuropsych: This patientiscapable of making decisions on herown behalf. 6. Skin/Wound Care:Protein supplement for hypoalbuminemia  --monitor wound for healing.  -apply dry dressing and ACE to residual limb -apply small amount of gauze to protect gangrenous second toe which is about to auto-amputate   2/22- L BKA looking better-  healing; stable in RLE/toes- doesn't appear to be source of Leukocytosis 7. Fluids/Electrolytes/Nutrition:Strict I/O. Labs with HD. 1200cc/FR. 8. ESRD: Schedule HD at the end of the day on MWF to help with tolerance of therapy.  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   -Cr increased to 11.13 on 2/19- appreciate management by nephrology 9. T2DM with nephropathy: Hgb A1c- 6.4 on last check. Was on Lantus 123XX123 units with Trulicity 1.5 mg/wk.  --Monitor BS ac/hs and use SSI for elevated BS --Continue to titrate Lantus to home dose as indicated   2/25- BGs 123-150- con't regimen- well controlled overall 10 HTN: Monitor BP tid. Has been labile --Continue Metoprolol and amlodipine.  2/25- BP 120s/60s-70s- well controlled- con't meds per Renal 11. PVD: Continue Plavix, ASA and Lipitor 12. Morbid obesity: BMI 39.01. --educate on diet as well as importance of weight loss to help promote health and mobility.  13. Anemia of chronic disease: Being  managed with weekly Aranesp.  14. Thrombocytosis: Continue to trend--improved from peak of 774-->600 15. Leukocytosis: discussed with patient that WBC is elevated. She is asymptomatic and without fever, repeat with next dialysis labs to trend  2/21- WBC up to 15k-will check U/A and Cx since pt is oliguric, not anuric- most likely cause vs her dry gangrene of R foot/toes- will reassess in Am and recheck CBC/renal fxn panel in AM- even though it's not at HD, need the results.   2/22- WBC down to 13.3k - U/A and Cx is pending. D/w nursing  2/24- WBC went up to 15.6 yesterday- back down to 13k today/this AM- will do additional ID w/u including blood Cx's.  2/25- Blood Cx's pending; Urine Cx no growth- WBC 14.9 today- asymptomatic- pt says she remembers that she usually has elevated WBC for an unknown reason. If blood C'xs negative, will ask her PCP to address after d/c.   16. Hyperkalemia/Hyponatremia  2/21- will d/w renal- NA down to 126 and K+ up to 5.6.   2/22- K+ down to 4.0 and Na up to 130- con't per renal  2/24- NA 129- up from 124 and K+ is 4.3- per renal.   2/25- Na 126 today- per renal- thank you for assistance.   LOS: 7 days A FACE TO FACE EVALUATION WAS PERFORMED  Christy Zhang 06/23/2020, 6:01 PM

## 2020-06-23 NOTE — Progress Notes (Signed)
Physical Therapy Session Note  Patient Details  Name: Christy Zhang MRN: [id number] Date of Birth: 09-23-1955  Today's Date: 06/23/2020 PT Individual Time: 1000-1100 PT Individual Time Calculation (min): 60 min   Short Term Goals: Week 1:  PT Short Term Goal 1 (Week 1): STG=LTG due to ELOS  Skilled Therapeutic Interventions/Progress Updates:    Patient supine in bed upon PT arrival. Patient alert and agreeable to PT session. Patient c/o mild pain in L shoulder during session that she said is from a fall she suffered yesterday evening when transferring from w/c to bed. Performing SPVT with nsg when her leg buckled.   Therapeutic Activity: Bed Mobility: Patient performed bed mobility including roll to sidelying with Mod I to each side. Supine <> sit with supervision. Provided verbal cues for technique. Transfers: Patient performed STS to RW at EOB with CGA. SPVT transfer using RW to get to w/c and back to bed x2 with CGA/ min A in order to improve pt's shaken self-efficacy after fall the evening before. Provided vc for technique and use of heel raise with pivot on toe rather than hop-step.   Therapeutic Exercise: Patient performed the following exercises in supine, sidelying, and seated positions with vc/tc for proper technique. Sidelying hip abd 2x10 Supine SLR 2x10 Supine RLE resisted leg press 2x10 Supine bridging against bolster 2x10  Removed KI to perform: Seated LAQs with 3 sec holds into max flexion (for quad stretch) and 3sec holds in extension.  Pt educated that she does not have to wear KI 24/7. KI is only being worn as a limb protector. She should wear it when mobilizing, but should be able to take KI off while in bed. And with KI off, she will need to continue to flex/ extend knee in order to maintain good flexibility for upcoming ambulation when she receives her prosthetic limb.  Patient supine in bed at end of session with brakes locked, bed alarm set, and all needs  within reach.  Therapy Documentation Precautions:  Precautions Precautions: Fall Precaution Comments: falls, R 2nd toe wound Required Braces or Orthoses: Knee Immobilizer - Left Knee Immobilizer - Left: Other (comment) (on when mobile) Restrictions Weight Bearing Restrictions: Yes LLE Weight Bearing: Non weight bearing   Therapy/Group: Individual Therapy  Alger Simons 06/23/2020, 5:59 PM

## 2020-06-24 LAB — GLUCOSE, CAPILLARY
Glucose-Capillary: 111 mg/dL — ABNORMAL HIGH (ref 70–99)
Glucose-Capillary: 159 mg/dL — ABNORMAL HIGH (ref 70–99)

## 2020-06-24 MED ORDER — DICLOFENAC SODIUM 1 % EX GEL
2.0000 g | Freq: Four times a day (QID) | CUTANEOUS | Status: DC
  Administered 2020-06-24 – 2020-07-04 (×25): 2 g via TOPICAL
  Filled 2020-06-24: qty 100

## 2020-06-24 NOTE — Progress Notes (Signed)
PROGRESS NOTE   Subjective/Complaints:   Pt reports L shoulder still sore/hurting for assisted fall 2 days ago- wondering how to treat.  Ate 100% breakfast.  Had a little phantom pain last night.    ROS:   Pt denies SOB, abd pain, CP, N/V/C/D, and vision changes   Objective:   DG Chest 2 View  Result Date: 06/22/2020 CLINICAL DATA:  Elevated WBC count. No chest complaints. Hx of CHF, asthma, CKD, ESRD, AND HTN. Pt is a former smoker. EXAM: CHEST - 2 VIEW COMPARISON:  Chest radiograph 08/25/2014 FINDINGS: Vascular graft noted in the right upper chest. Heart size is enlarged. The lungs are clear. No pneumothorax or pleural effusion. Degenerative changes are noted in the thoracic spine. IMPRESSION: Cardiomegaly.  No evidence of active disease. Electronically Signed   By: Audie Pinto M.D.   On: 06/22/2020 16:59   Recent Labs    06/22/20 0506 06/23/20 1239  WBC 13.2* 14.9*  HGB 7.6* 7.3*  HCT 24.1* 24.1*  PLT 648* 612*   Recent Labs    06/22/20 0506 06/23/20 1226  NA 129* 126*  K 4.3 4.9  CL 93* 90*  CO2 26 23  GLUCOSE 106* 124*  BUN 38* 60*  CREATININE 6.82* 9.28*  CALCIUM 8.3* 8.6*    Intake/Output Summary (Last 24 hours) at 06/24/2020 1553 Last data filed at 06/24/2020 1411 Gross per 24 hour  Intake 477 ml  Output 2966 ml  Net -2489 ml        Physical Exam: Vital Signs Blood pressure 131/68, pulse 69, temperature 99.6 F (37.6 C), temperature source Oral, resp. rate 16, height '5\' 3"'$  (1.6 m), weight 102.1 kg, SpO2 96 %.   General: awake, alert, appropriate, NAD- sitting up in bed HENT: conjugate gaze;  slept with mouth open- is dry CV: regular rate; no JVD Pulmonary: CTA B/L; no W/R/R- good air movement GI: soft, NT, ND, (+)BS Psychiatric: appropriate- but quiet Neurological: Ox3  Skin: intact Musculoskeletal: TTP over L shoulder anterior top and posterior Cervical back: Normal range  of motion.  Comments: L BKA has retention sutures and staples- a little sanguinous drainage on dressing, nothing active- looks good- no dog ears, swelling improved- wearing ACE wraps and KI- looks the same as Tuesday except slightly open on 1 side- steristrips placed Skin: General: Skin is warm.  Comments: Chronic changes in RLE, 3rd digit wrapped in kerlex; 2nd and 4th toe have dry gangrene Skin very dry in foot/leg. Neuro: Ox3  Cranial nerve exam unremarkable. UE motor 4+ to 5/5. RLE: 3+/5 HF, 4/5 KE and 3-4/5 ADF/PF. LLE: able to lift leg off bed about 6". Decreased LT RLE distally.      Assessment/Plan: 1. Functional deficits which require 3+ hours per day of interdisciplinary therapy in a comprehensive inpatient rehab setting.  Physiatrist is providing close team supervision and 24 hour management of active medical problems listed below.  Physiatrist and rehab team continue to assess barriers to discharge/monitor patient progress toward functional and medical goals  Care Tool:  Bathing    Body parts bathed by patient: Face,Right arm,Chest,Left arm,Abdomen,Right upper leg,Front perineal area,Left upper leg   Body parts bathed by helper: Buttocks,Right  lower leg Body parts n/a: Left lower leg   Bathing assist Assist Level: Moderate Assistance - Patient 50 - 74%     Upper Body Dressing/Undressing Upper body dressing   What is the patient wearing?: Pull over shirt    Upper body assist Assist Level: Minimal Assistance - Patient > 75%    Lower Body Dressing/Undressing Lower body dressing      What is the patient wearing?: Skirt,Underwear/pull up     Lower body assist Assist for lower body dressing: Moderate Assistance - Patient 50 - 74%     Toileting Toileting    Toileting assist Assist for toileting: Maximal Assistance - Patient 25 - 49%     Transfers Chair/bed transfer  Transfers assist     Chair/bed transfer assist level: Contact Guard/Touching  assist Chair/bed transfer assistive device: Programmer, multimedia   Ambulation assist   Ambulation activity did not occur: Safety/medical concerns  Assist level: Contact Guard/Touching assist Assistive device: Parallel bars Max distance: 8'   Walk 10 feet activity   Assist  Walk 10 feet activity did not occur: Safety/medical concerns        Walk 50 feet activity   Assist Walk 50 feet with 2 turns activity did not occur: Safety/medical concerns         Walk 150 feet activity   Assist Walk 150 feet activity did not occur: Safety/medical concerns         Walk 10 feet on uneven surface  activity   Assist Walk 10 feet on uneven surfaces activity did not occur: Safety/medical concerns         Wheelchair     Assist Will patient use wheelchair at discharge?: Yes Type of Wheelchair: Manual    Wheelchair assist level: Supervision/Verbal cueing Max wheelchair distance: 150    Wheelchair 50 feet with 2 turns activity    Assist        Assist Level: Supervision/Verbal cueing   Wheelchair 150 feet activity     Assist      Assist Level: Supervision/Verbal cueing   Blood pressure 131/68, pulse 69, temperature 99.6 F (37.6 C), temperature source Oral, resp. rate 16, height '5\' 3"'$  (1.6 m), weight 102.1 kg, SpO2 96 %.  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 -patient may showerif residual limb is water-proofed  2/26- will reassess L BKA tomorrow when more awake- asked me not to assess today -ELOS/Goals: 7-10 days, Mod I at w/c level  -Continue CIR 2. Antithrombotics: -DVT/anticoagulation:Pharmaceutical:Continue Heparin -antiplatelet therapy: ASA/Plavix 3. Pain Management:ContinueOxycodone prn--received 2 doses 2/20  2/21- having more phantom pain than anything- on no phantom pain meds- will start  Gabapentin 300 mg QHS for now- since on HD- max dose we can usually use.   2/22- pain better today- con't regimen  2/24- pt reports some early AM hallucinations- will change to Norco for pain- if doesn't improve, will need to decrease Gabapentin dose  2/25- doesn't report any hallucinations and Norco working well for her- for pain control- con't regimen  2/26- pain doing well except L shoulder- will start Voltaren gel QID for L shoulder 4. Mood:LCSW to follow for evaluation and support. -antipsychotic agents: N/A 5. Neuropsych: This patientiscapable of making decisions on herown behalf. 6. Skin/Wound Care:Protein supplement for hypoalbuminemia  --monitor wound for healing.  -apply dry dressing and ACE to residual limb -apply small amount of gauze to protect gangrenous second toe which is  about to auto-amputate   2/22- L BKA looking better- healing; stable in RLE/toes- doesn't appear to be source of Leukocytosis 7. Fluids/Electrolytes/Nutrition:Strict I/O. Labs with HD. 1200cc/FR. 8. ESRD: Schedule HD at the end of the day on MWF to help with tolerance of therapy.  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   -Cr increased to 11.13 on 2/19- appreciate management by nephrology 9. T2DM with nephropathy: Hgb A1c- 6.4 on last check. Was on Lantus 123XX123 units with Trulicity 1.5 mg/wk.  --Monitor BS ac/hs and use SSI for elevated BS --Continue to titrate Lantus to home dose as indicated   2/25- BGs 123-150- con't regimen- well controlled overall  2/26- BGs 111-123- doing great- con't regimen 10 HTN: Monitor BP tid. Has been labile --Continue Metoprolol and amlodipine.  2/25- BP 120s/60s-70s- well controlled- con't meds per Renal 11. PVD: Continue Plavix, ASA and Lipitor 12. Morbid obesity: BMI 39.01. --educate on diet as well as importance of weight loss to  help promote health and mobility.  13. Anemia of chronic disease: Being managed with weekly Aranesp.  14. Thrombocytosis: Continue to trend--improved from peak of 774-->600  2/26- Plts 612k- stable- con't to monitor  15. Leukocytosis: discussed with patient that WBC is elevated. She is asymptomatic and without fever, repeat with next dialysis labs to trend  2/21- WBC up to 15k-will check U/A and Cx since pt is oliguric, not anuric- most likely cause vs her dry gangrene of R foot/toes- will reassess in Am and recheck CBC/renal fxn panel in AM- even though it's not at HD, need the results.   2/22- WBC down to 13.3k - U/A and Cx is pending. D/w nursing  2/24- WBC went up to 15.6 yesterday- back down to 13k today/this AM- will do additional ID w/u including blood Cx's.  2/25- Blood Cx's pending; Urine Cx no growth- WBC 14.9 today- asymptomatic- pt says she remembers that she usually has elevated WBC for an unknown reason. If blood C'xs negative, will ask her PCP to address after d/c.    2/26- blood Cx's (-) so far- con't regimen 16. Hyperkalemia/Hyponatremia  2/21- will d/w renal- NA down to 126 and K+ up to 5.6.   2/22- K+ down to 4.0 and Na up to 130- con't per renal  2/24- NA 129- up from 124 and K+ is 4.3- per renal.   2/25- Na 126 today- per renal- thank you for assistance.   LOS: 8 days A FACE TO FACE EVALUATION WAS PERFORMED  Christy Zhang 06/24/2020, 3:53 PM

## 2020-06-24 NOTE — Progress Notes (Signed)
[facility] Progress Note   Subjective:  Seen in room. No pain today. No CP/dyspnea. Dialyzed yesterday without issues - net UF 3L.  Objective Vitals:   06/23/20 1721 06/23/20 1821 06/23/20 1943 06/24/20 0451  BP: 135/61 (!) 147/61 (!) 142/59 (!) 132/57  Pulse: 68 66 69 72  Resp: '18 17 16 20  '$ Temp: 98.5 F (36.9 C) 99.8 F (37.7 C) 98.3 F (36.8 C) 99 F (37.2 C)  TempSrc: Oral Oral Oral Oral  SpO2: 99% 98% 97% 95%  Weight: 101.4 kg   102.1 kg  Height:       Physical Exam General: Well appearing, overweight woman, NAD. Room air. Heart: RRR; no murmur Lungs: CTA anteriorly Abdomen: soft, non-tender Extremities: L BKA which is bandaged, no RLE edema (toes bandaged) Dialysis Access: RUE AVG + bruit  Additional Objective Labs: Basic Metabolic Panel: Recent Labs  Lab 06/21/20 1426 06/22/20 0506 06/23/20 1226  NA 124* 129* 126*  K 4.6 4.3 4.9  CL 86* 93* 90*  CO2 '22 26 23  '$ GLUCOSE 158* 106* 124*  BUN 59* 38* 60*  CREATININE 9.33* 6.82* 9.28*  CALCIUM 8.8* 8.3* 8.6*  PHOS 5.6* 4.0 5.1*   Liver Function Tests: Recent Labs  Lab 06/21/20 1426 06/22/20 0506 06/23/20 1226  ALBUMIN 2.6* 2.4* 2.5*   CBC: Recent Labs  Lab 06/19/20 1413 06/20/20 0521 06/21/20 1426 06/22/20 0506 06/23/20 1239  WBC 15.0* 13.3* 15.6* 13.2* 14.9*  NEUTROABS  --  9.4*  --  9.4*  --   HGB 7.6* 7.6* 8.0* 7.6* 7.3*  HCT 24.1* 23.9* 25.1* 24.1* 24.1*  MCV 81.1 81.3 81.8 82.8 84.0  PLT 646* 623* 738* 648* 612*   Blood Culture    Component Value Date/Time   SDES BLOOD LEFT ANTECUBITAL 06/22/2020 1740   SPECREQUEST  06/22/2020 1740    BOTTLES DRAWN AEROBIC AND ANAEROBIC Blood Culture results may not be optimal due to an inadequate volume of blood received in culture bottles   CULT  06/22/2020 1740    NO GROWTH < 24 HOURS Performed at [facility], [place] [address], [place], [place] [address]    REPTSTATUS PENDING 06/22/2020 1740   Studies/Results: DG Chest 2  View  Result Date: 06/22/2020 CLINICAL DATA:  Elevated WBC count. No chest complaints. Hx of CHF, asthma, CKD, ESRD, AND HTN. Pt is a former smoker. EXAM: CHEST - 2 VIEW COMPARISON:  Chest radiograph 08/25/2014 FINDINGS: Vascular graft noted in the right upper chest. Heart size is enlarged. The lungs are clear. No pneumothorax or pleural effusion. Degenerative changes are noted in the thoracic spine. IMPRESSION: Cardiomegaly.  No evidence of active disease. Electronically Signed   By: Audie Pinto M.D.   On: 06/22/2020 16:59   Medications: . ferric gluconate (FERRLECIT/NULECIT) IV Stopped (06/23/20 1744)   . (feeding supplement) PROSource Plus  30 mL Oral BID BM  . amLODipine  10 mg Oral QHS  . aspirin EC  81 mg Oral QHS  . atorvastatin  40 mg Oral QHS  . Chlorhexidine Gluconate Cloth  6 each Topical Q0600  . Chlorhexidine Gluconate Cloth  6 each Topical Q0600  . Chlorhexidine Gluconate Cloth  6 each Topical Q0600  . clopidogrel  75 mg Oral Daily  . darbepoetin (ARANESP) injection - DIALYSIS  100 mcg Intravenous Q Fri-HD  . doxercalciferol  1.5 mcg Intravenous Q M,W,F-HD  . Dulaglutide  1.5 mg Subcutaneous Weekly  . gabapentin  300 mg Oral QHS  . heparin  5,000 Units  Subcutaneous Q8H  . insulin glargine  20 Units Subcutaneous Daily  . lanthanum  1,000 mg Oral TID WC  . metoprolol succinate  50 mg Oral Daily  . multivitamin  1 tablet Oral QHS  . polyethylene glycol  17 g Oral Daily  . Ensure Max Protein  11 oz Oral Daily  . senna-docusate  1 tablet Oral BID  . sevelamer carbonate  2,400 mg Oral TID WC    Dialysis Orders: [facility] MWF 4hrs 2K 2.5Ca EDW 101kg RU AVG Heparin 1000 load and 1000 units qhr EPO 14000 units qHD Venofer '100mg'$  qHD Hectorol 1.96mg qHD   Assessment/Plan: 1. Gangrene L foot -> L BKA 2/10 by VVS: in CIR for therapy.  2. 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 Per primary. 3. Leukocytosis -up and down. BCxand UCxnegative.  4. ESRD:  Continue HD per MWF schedule - next 2/28. 5. HTN/ volume: BP fine, but with hyponatremia which favors volume excess - ^ UF goal for next HD. Continue home amlodipineand metoprolol. 6. DM2 - peradmitting team. 7. Anemia of ESRD: Hgb7.3, Aranesp ^ to 1028m - given 2/25, follow.Tsat 24% - ordered short Fe course. 8. MBD of ESRD -Calciumand phos in goal.Continue renvela and VDRA.  9. Nutrition - Renal diet with fluid restrictions. Protein supplements, Vitamins.   KaVeneta PentonPA-C 06/24/2020, 9:15 AM  [employer]

## 2020-06-24 NOTE — Plan of Care (Signed)
  Problem: Consults Goal: RH LIMB LOSS PATIENT EDUCATION Description: Description: See Patient Education module for eduction specifics. Outcome: Progressing Goal: Diabetes Guidelines if Diabetic/Glucose > 140 Description: If diabetic or lab glucose is > 140 mg/dl - Initiate Diabetes/Hyperglycemia Guidelines & Document Interventions  Outcome: Progressing   Problem: RH BOWEL ELIMINATION Goal: RH STG MANAGE BOWEL WITH ASSISTANCE Description: STG Manage Bowel with mod I Assistance. Outcome: Progressing   Problem: RH SKIN INTEGRITY Goal: RH STG ABLE TO PERFORM INCISION/WOUND CARE W/ASSISTANCE Description: STG Able To Perform Incision/Wound Care With mod I Assistance. Outcome: Progressing   Problem: RH PAIN MANAGEMENT Goal: RH STG PAIN MANAGED AT OR BELOW PT'S PAIN GOAL Description: Pain level less than 4 on scale of 0-10 Outcome: Progressing   Problem: RH KNOWLEDGE DEFICIT LIMB LOSS Goal: RH STG INCREASE KNOWLEDGE OF SELF CARE AFTER LIMB LOSS Description: Pt will be able to demonstrate understanding of medication regimen and dietary precautions to manage blood glucose and ESRD and prevent complications with mod I assist using handouts and booklets provided.  Outcome: Progressing

## 2020-06-25 LAB — GLUCOSE, CAPILLARY
Glucose-Capillary: 95 mg/dL (ref 70–99)
Glucose-Capillary: 131 mg/dL — ABNORMAL HIGH (ref 70–99)

## 2020-06-25 NOTE — Progress Notes (Signed)
PROGRESS NOTE   Subjective/Complaints:   Pt reports OK- slept well; bowels working well;  Phantom pain "a little better"- didn't have as much last night.    ROS:   Pt denies SOB, abd pain, CP, N/V/C/D, and vision changes   Objective:   No results found. Recent Labs    06/23/20 1239  WBC 14.9*  HGB 7.3*  HCT 24.1*  PLT 612*   Recent Labs    06/23/20 1226  NA 126*  K 4.9  CL 90*  CO2 23  GLUCOSE 124*  BUN 60*  CREATININE 9.28*  CALCIUM 8.6*    Intake/Output Summary (Last 24 hours) at 06/25/2020 1048 Last data filed at 06/25/2020 0750 Gross per 24 hour  Intake 417 ml  Output -  Net 417 ml        Physical Exam: Vital Signs Blood pressure (!) 134/54, pulse 63, temperature 98.1 F (36.7 C), temperature source Oral, resp. rate 20, height '5\' 3"'$  (1.6 m), weight 102.3 kg, SpO2 95 %.     General: awake, alert, appropriate, NAD- sitting up in bed;  HENT: conjugate gaze; oropharynx moist- missing some teeth CV: regular rate; no JVD Pulmonary: CTA B/L; no W/R/R- good air movement GI: soft, NT, ND, (+)BS; protuberant Psychiatric: appropriate- quiet Neurological: Ox3 Skin: intact except LEs- L BKA and R toes- gangrene Musculoskeletal: TTP over L shoulder anterior top and posterior Cervical back: Normal range of motion.  Comments: L BKA has retention sutures and staples- a little sanguinous drainage on dressing, nothing active- looks good- no dog ears, swelling improved- wearing ACE wraps and KI- looks the same as Tuesday except slightly open on 1 side- steristrips placed Skin: General: Skin is warm.  Comments: Chronic changes in RLE, 3rd digit wrapped in kerlex; 2nd and 4th toe have dry gangrene Skin very dry in foot/leg. Neuro: Ox3  Cranial nerve exam unremarkable. UE motor 4+ to 5/5. RLE: 3+/5 HF, 4/5 KE and 3-4/5 ADF/PF. LLE: able to lift leg off bed about 6". Decreased LT RLE  distally.    Assessment/Plan: 1. Functional deficits which require 3+ hours per day of interdisciplinary therapy in a comprehensive inpatient rehab setting.  Physiatrist is providing close team supervision and 24 hour management of active medical problems listed below.  Physiatrist and rehab team continue to assess barriers to discharge/monitor patient progress toward functional and medical goals  Care Tool:  Bathing    Body parts bathed by patient: Face,Right arm,Chest,Left arm,Abdomen,Right upper leg,Front perineal area,Left upper leg   Body parts bathed by helper: Buttocks,Right lower leg Body parts n/a: Left lower leg   Bathing assist Assist Level: Moderate Assistance - Patient 50 - 74%     Upper Body Dressing/Undressing Upper body dressing   What is the patient wearing?: Pull over shirt    Upper body assist Assist Level: Minimal Assistance - Patient > 75%    Lower Body Dressing/Undressing Lower body dressing      What is the patient wearing?: Skirt,Underwear/pull up     Lower body assist Assist for lower body dressing: Moderate Assistance - Patient 50 - 74%     Toileting Toileting    Toileting assist Assist for toileting:  Maximal Assistance - Patient 25 - 49%     Transfers Chair/bed transfer  Transfers assist     Chair/bed transfer assist level: Contact Guard/Touching assist Chair/bed transfer assistive device: Programmer, multimedia   Ambulation assist   Ambulation activity did not occur: Safety/medical concerns  Assist level: Contact Guard/Touching assist Assistive device: Parallel bars Max distance: 8'   Walk 10 feet activity   Assist  Walk 10 feet activity did not occur: Safety/medical concerns        Walk 50 feet activity   Assist Walk 50 feet with 2 turns activity did not occur: Safety/medical concerns         Walk 150 feet activity   Assist Walk 150 feet activity did not occur: Safety/medical concerns          Walk 10 feet on uneven surface  activity   Assist Walk 10 feet on uneven surfaces activity did not occur: Safety/medical concerns         Wheelchair     Assist Will patient use wheelchair at discharge?: Yes Type of Wheelchair: Manual    Wheelchair assist level: Supervision/Verbal cueing Max wheelchair distance: 150    Wheelchair 50 feet with 2 turns activity    Assist        Assist Level: Supervision/Verbal cueing   Wheelchair 150 feet activity     Assist      Assist Level: Supervision/Verbal cueing   Blood pressure (!) 134/54, pulse 63, temperature 98.1 F (36.7 C), temperature source Oral, resp. rate 20, height '5\' 3"'$  (1.6 m), weight 102.3 kg, SpO2 95 %.  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 -patient may showerif residual limb is water-proofed -ELOS/Goals: 7-10 days, Mod I at w/c level  -Continue CIR 2. Antithrombotics: -DVT/anticoagulation:Pharmaceutical:Continue Heparin -antiplatelet therapy: ASA/Plavix 3. Pain Management:ContinueOxycodone prn--received 2 doses 2/20  2/21- having more phantom pain than anything- on no phantom pain meds- will start Gabapentin 300 mg QHS for now- since on HD- max dose we can usually use.   2/22- pain better today- con't regimen  2/24- pt reports some early AM hallucinations- will change to Norco for pain- if doesn't improve, will need to decrease Gabapentin dose  2/25- doesn't report any hallucinations and Norco working well for her- for pain control- con't regimen  2/26- pain doing well except L shoulder- will start Voltaren gel QID for L shoulder  2/27- denied pain this AM- con't regimen 4. Mood:LCSW to follow for evaluation and support. -antipsychotic agents: N/A 5. Neuropsych: This patientiscapable of making decisions on herown behalf. 6. Skin/Wound  Care:Protein supplement for hypoalbuminemia  --monitor wound for healing.  -apply dry dressing and ACE to residual limb -apply small amount of gauze to protect gangrenous second toe which is about to auto-amputate   2/22- L BKA looking better- healing; stable in RLE/toes- doesn't appear to be source of Leukocytosis 7. Fluids/Electrolytes/Nutrition:Strict I/O. Labs with HD. 1200cc/FR. 8. ESRD: Schedule HD at the end of the day on MWF to help with tolerance of therapy.  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   -Cr increased to 11.13 on 2/19- appreciate management by nephrology 9. T2DM with nephropathy: Hgb A1c- 6.4 on last check. Was on Lantus 123XX123 units with Trulicity 1.5 mg/wk.  --Monitor BS ac/hs and use SSI for elevated BS --Continue to titrate Lantus to home dose as indicated   2/27- BGs 95-159- doing great- con't regimen 10  HTN: Monitor BP tid. Has been labile --Continue Metoprolol and amlodipine.  2/25- BP 120s/60s-70s- well controlled- con't meds per Renal 11. PVD: Continue Plavix, ASA and Lipitor 12. Morbid obesity: BMI 39.01. --educate on diet as well as importance of weight loss to help promote health and mobility.  13. Anemia of chronic disease: Being managed with weekly Aranesp.  14. Thrombocytosis: Continue to trend--improved from peak of 774-->600  2/26- Plts 612k- stable- con't to monitor  15. Leukocytosis: discussed with patient that WBC is elevated. She is asymptomatic and without fever, repeat with next dialysis labs to trend  2/21- WBC up to 15k-will check U/A and Cx since pt is oliguric, not anuric- most likely cause vs her dry gangrene of R foot/toes- will reassess in Am and recheck CBC/renal fxn panel in AM- even though it's not at HD, need the results.   2/22- WBC down to 13.3k - U/A and Cx is pending. D/w nursing  2/24- WBC went up to 15.6  yesterday- back down to 13k today/this AM- will do additional ID w/u including blood Cx's.  2/25- Blood Cx's pending; Urine Cx no growth- WBC 14.9 today- asymptomatic- pt says she remembers that she usually has elevated WBC for an unknown reason. If blood C'xs negative, will ask her PCP to address after d/c.    2/27- no growth x 2 days so far- will con't to monitor 16. Hyperkalemia/Hyponatremia  2/21- will d/w renal- NA down to 126 and K+ up to 5.6.   2/22- K+ down to 4.0 and Na up to 130- con't per renal  2/24- NA 129- up from 124 and K+ is 4.3- per renal.   2/25- Na 126 today- per renal- thank you for assistance.   LOS: 9 days A FACE TO FACE EVALUATION WAS PERFORMED  Christy Zhang 06/25/2020, 10:48 AM

## 2020-06-25 NOTE — Progress Notes (Signed)
Occupational Therapy Session Note  Patient Details  Name: Christy Zhang MRN: [id number] Date of Birth: 1955-06-15  Today's Date: 06/25/2020 OT Group Time: 1100-1200 OT Group Time Calculation (min): 60 min  Skilled Therapeutic Interventions/Progress Updates:    Pt engaged in therapeutic w/c level dance group focusing on patient choice, UE/LE strengthening, salience, activity tolerance, and social participation. Pt was guided through various dance-based exercises involving UEs/LEs and trunk. All music was selected by group members. Emphasis placed on UB strengthening and endurance. Pt participated very well while seated, took rest breaks as needed and appeared to enjoy the social atmosphere of group. At end of session pt was returned to the room via w/c by RT.   Therapy Documentation Precautions:  Precautions Precautions: Fall Precaution Comments: falls, R 2nd toe wound Required Braces or Orthoses: Knee Immobilizer - Left Knee Immobilizer - Left: Other (comment) (on when mobile) Restrictions Weight Bearing Restrictions: Yes LLE Weight Bearing: Non weight bearing Pain: no s/s pain during session   ADL: ADL Eating: Set up Where Assessed-Eating: Wheelchair Grooming: Setup Where Assessed-Grooming: Wheelchair Upper Body Bathing: Supervision/safety Where Assessed-Upper Body Bathing: Wheelchair Lower Body Bathing: Moderate assistance Where Assessed-Lower Body Bathing: Wheelchair Upper Body Dressing: Minimal assistance Where Assessed-Upper Body Dressing: Wheelchair Lower Body Dressing: Moderate assistance Where Assessed-Lower Body Dressing: Wheelchair Toileting: Moderate assistance Where Assessed-Toileting: Bedside Commode Toilet Transfer: Moderate assistance Toilet Transfer Method: Stand pivot Toilet Transfer Equipment: Radiographer, therapeutic: Not assessed Social research officer, government: Not assessed (Does not shower at baseline)     Therapy/Group: Group  Therapy  [name] 06/25/2020, 12:38 PM

## 2020-06-25 NOTE — Progress Notes (Addendum)
Physical Therapy Session Note  Patient Details  Name: Christy Zhang MRN: [id number] Date of Birth: 1955/12/05  Today's Date: 06/25/2020 PT Individual Time: 1002-1057 PT Individual Time Calculation (min): 55 min   Short Term Goals: Week 1:  PT Short Term Goal 1 (Week 1): STG=LTG due to ELOS  Skilled Therapeutic Interventions/Progress Updates: Pt presents supine in bed and agreeable to therapy.  Pt does state that she fell the other night transferring and doesn't know why.  Pt transfers sup to sit w/ supervision.  Pt performed sit to stand w/ CGA to min A and then performed step-pivot/pivot on R foot w/ RW and min A to w/c.  Knee immobilizer donned in w/c.  Pt states unable to clear foot from floor right now, probably 2/2 recent fall.  Pt taken to hallway and the she performed multiple trials of w/c mobility 50-75' per trial w/ Supervision.  Pt required rest breaks between trials.  Pt given w/c gloves and pillow placed behind back to improve posture w/ some improvement in performance.  Pt performed BLE there ex w/ 2# to RLE for LAQ, hip flexion, isometric add and abd/add when scooting to edge of seat.  Pt performed active ROM Left knee flexion to 90 degrees and extension to -8 degrees.   Pt practiced multiple trials of scooting forward and back in seat for positioning.  Pt returned to room and remained in w/c w/ chair alarm on and all needs in reach.     Therapy Documentation Precautions:  Precautions Precautions: Fall Precaution Comments: falls, R 2nd toe wound Required Braces or Orthoses: Knee Immobilizer - Left Knee Immobilizer - Left: Other (comment) (on when mobile) Restrictions Weight Bearing Restrictions: Yes LLE Weight Bearing: Non weight bearing General:   Vital Signs:  Pain:0/10, w/ Tylenol per pt.      Therapy/Group: Individual Therapy  [name] 06/25/2020, 10:58 AM

## 2020-06-25 NOTE — Plan of Care (Signed)
  Problem: Consults Goal: RH LIMB LOSS PATIENT EDUCATION Description: Description: See Patient Education module for eduction specifics. Outcome: Progressing Goal: Diabetes Guidelines if Diabetic/Glucose > 140 Description: If diabetic or lab glucose is > 140 mg/dl - Initiate Diabetes/Hyperglycemia Guidelines & Document Interventions  Outcome: Progressing   Problem: RH BOWEL ELIMINATION Goal: RH STG MANAGE BOWEL WITH ASSISTANCE Description: STG Manage Bowel with mod I Assistance. Outcome: Progressing   Problem: RH SKIN INTEGRITY Goal: RH STG ABLE TO PERFORM INCISION/WOUND CARE W/ASSISTANCE Description: STG Able To Perform Incision/Wound Care With mod I Assistance. Outcome: Progressing   Problem: RH PAIN MANAGEMENT Goal: RH STG PAIN MANAGED AT OR BELOW PT'S PAIN GOAL Description: Pain level less than 4 on scale of 0-10 Outcome: Progressing   Problem: RH KNOWLEDGE DEFICIT LIMB LOSS Goal: RH STG INCREASE KNOWLEDGE OF SELF CARE AFTER LIMB LOSS Description: Pt will be able to demonstrate understanding of medication regimen and dietary precautions to manage blood glucose and ESRD and prevent complications with mod I assist using handouts and booklets provided.  Outcome: Progressing

## 2020-06-25 NOTE — Progress Notes (Signed)
Occupational Therapy Session Note  Patient Details  Name: ARITZA DEUTSCHMAN MRN: [id number] Date of Birth: [birth year]  Today's Date: 06/25/2020 OT Individual Time: 1411-1440 OT Individual Time Calculation (min): 29 min   Short Term Goals: Week 1:  OT Short Term Goal 1 (Week 1): Pt will complete BSC transfer with CGA + AE PRN OT Short Term Goal 2 (Week 1): Pt will complete standing bimanual ADL  for >2 min with CGA + AE PRN. OT Short Term Goal 3 (Week 1): Pt will don shirt with close S.     Skilled Therapeutic Interventions/Progress Updates:    Pt greeted in the w/c with no c/o pain, requesting to use the restroom. CGA for stand pivot<elevated toilet where pt had B+B void. Taught her lateral lean technique with 1 drop arm lowered on the BSC. Pt able to complete hygiene with supervision assist. Also tried using lateral lean technique to elevate new brief and skirt over hips however due to body habitus, pt unable to fully elevate clothing in the back. Worked on sit<stand with education on hand + foot placement when using RW in order for OT to elevate brief + skirt in the back. CGA for stand pivot<w/c after where she completed hand hygiene, oral care, and UB dressing while seated at the sink given supervision-setup assist. Pt remained in the w/c at close of session after OT rewrapped residual limb, left in care of RN due to bleeding from site on abdomen.   Therapy Documentation Precautions:  Precautions Precautions: Fall Precaution Comments: falls, R 2nd toe wound Required Braces or Orthoses: Knee Immobilizer - Left Knee Immobilizer - Left: Other (comment) (on when mobile) Restrictions Weight Bearing Restrictions: Yes LLE Weight Bearing: Non weight bearing Vital Signs: Therapy Vitals Temp: 98 F (36.7 C) Pulse Rate: 67 Resp: 17 BP: 140/69 Patient Position (if appropriate): Sitting Oxygen Therapy SpO2: 100 % O2 Device: Room Air ADL: ADL Eating: Set up Where Assessed-Eating:  Wheelchair Grooming: Setup Where Assessed-Grooming: Wheelchair Upper Body Bathing: Supervision/safety Where Assessed-Upper Body Bathing: Wheelchair Lower Body Bathing: Moderate assistance Where Assessed-Lower Body Bathing: Wheelchair Upper Body Dressing: Minimal assistance Where Assessed-Upper Body Dressing: Wheelchair Lower Body Dressing: Moderate assistance Where Assessed-Lower Body Dressing: Wheelchair Toileting: Moderate assistance Where Assessed-Toileting: Bedside Commode Toilet Transfer: Moderate assistance Toilet Transfer Method: Stand pivot Toilet Transfer Equipment: Radiographer, therapeutic: Not assessed Social research officer, government: Not assessed (Does not shower at baseline)      Therapy/Group: Individual Therapy  Kebron Pulse A Jaisean Monteforte 06/25/2020, 3:22 PM

## 2020-06-25 NOTE — Progress Notes (Signed)
[facility] Progress Note   Subjective:  Seen in room - no overnight issues. No dyspnea, CP, nausea.  Objective Vitals:   06/24/20 0451 06/24/20 1536 06/24/20 2016 06/25/20 0348  BP: (!) 132/57 131/68 139/61 (!) 134/54  Pulse: 72 69 68 63  Resp: '20 16 19 20  '$ Temp: 99 F (37.2 C) 99.6 F (37.6 C) 99.6 F (37.6 C) 98.1 F (36.7 C)  TempSrc: Oral Oral Oral Oral  SpO2: 95% 96% 97% 95%  Weight: 102.1 kg   102.3 kg  Height:       Physical Exam General: Well appearing, overweight woman, NAD. Room air. Heart: RRR; no murmur Lungs: CTA anteriorly Abdomen: soft, non-tender Extremities: L BKA which is bandaged, no RLE edema (toes bandaged) Dialysis Access: RUE AVG + bruit  Additional Objective Labs: Basic Metabolic Panel: Recent Labs  Lab 06/21/20 1426 06/22/20 0506 06/23/20 1226  NA 124* 129* 126*  K 4.6 4.3 4.9  CL 86* 93* 90*  CO2 '22 26 23  '$ GLUCOSE 158* 106* 124*  BUN 59* 38* 60*  CREATININE 9.33* 6.82* 9.28*  CALCIUM 8.8* 8.3* 8.6*  PHOS 5.6* 4.0 5.1*   Liver Function Tests: Recent Labs  Lab 06/21/20 1426 06/22/20 0506 06/23/20 1226  ALBUMIN 2.6* 2.4* 2.5*   CBC: Recent Labs  Lab 06/19/20 1413 06/20/20 0521 06/21/20 1426 06/22/20 0506 06/23/20 1239  WBC 15.0* 13.3* 15.6* 13.2* 14.9*  NEUTROABS  --  9.4*  --  9.4*  --   HGB 7.6* 7.6* 8.0* 7.6* 7.3*  HCT 24.1* 23.9* 25.1* 24.1* 24.1*  MCV 81.1 81.3 81.8 82.8 84.0  PLT 646* 623* 738* 648* 612*   Medications: . ferric gluconate (FERRLECIT/NULECIT) IV Stopped (06/23/20 1744)   . (feeding supplement) PROSource Plus  30 mL Oral BID BM  . amLODipine  10 mg Oral QHS  . aspirin EC  81 mg Oral QHS  . atorvastatin  40 mg Oral QHS  . Chlorhexidine Gluconate Cloth  6 each Topical Q0600  . Chlorhexidine Gluconate Cloth  6 each Topical Q0600  . Chlorhexidine Gluconate Cloth  6 each Topical Q0600  . clopidogrel  75 mg Oral Daily  . darbepoetin (ARANESP) injection - DIALYSIS  100 mcg  Intravenous Q Fri-HD  . diclofenac Sodium  2 g Topical QID  . doxercalciferol  1.5 mcg Intravenous Q M,W,F-HD  . Dulaglutide  1.5 mg Subcutaneous Weekly  . gabapentin  300 mg Oral QHS  . heparin  5,000 Units Subcutaneous Q8H  . insulin glargine  20 Units Subcutaneous Daily  . lanthanum  1,000 mg Oral TID WC  . metoprolol succinate  50 mg Oral Daily  . multivitamin  1 tablet Oral QHS  . polyethylene glycol  17 g Oral Daily  . Ensure Max Protein  11 oz Oral Daily  . senna-docusate  1 tablet Oral BID  . sevelamer carbonate  2,400 mg Oral TID WC    Dialysis Orders: [facility] MWF 4hrs 2K 2.5Ca EDW 101kg RU AVG Heparin 1000 load and 1000 units qhr EPO 14000 units qHD Venofer '100mg'$  qHD Hectorol 1.39mg qHD   Assessment/Plan: 1. Gangrene L foot -> L BKA 2/10 by VVS: in CIR for therapy.  2. 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 Per primary. 3. Leukocytosis -up and down. BCxand UCxnegative.  4. ESRD: Continue HD per MWF schedule - next 2/28. 5. HTN/ volume: BP fine, but with hyponatremia which favors volume excess - ^ UF goal for next HD. Continue home amlodipineand metoprolol. 6.  DM2 - peradmitting team. 7. Anemia of ESRD: Hgb7.3, Aranesp ^ to 160mg - given 2/25, follow.Tsat 24% -orderedshort Fe course. 8. MBD of ESRD -Calciumand phos in goal.Continue renvela and VDRA.  9. Nutrition - Renal diet with fluid restrictions. Protein supplements, Vitamins.   KVeneta Penton PA-C 06/25/2020, 8:56 AM  [employer]

## 2020-06-25 NOTE — Progress Notes (Signed)
Informed by Rx that pt's Trulicity home supply has ran out. Informed pt and pt informed family. Family is to bring replacement pen tomorrow.   Gerald Stabs, RN

## 2020-06-26 DIAGNOSIS — N186 End stage renal disease: Secondary | ICD-10-CM | POA: Diagnosis not present

## 2020-06-26 DIAGNOSIS — Z992 Dependence on renal dialysis: Secondary | ICD-10-CM | POA: Diagnosis not present

## 2020-06-26 LAB — RENAL FUNCTION PANEL
Albumin: 2.4 g/dL — ABNORMAL LOW (ref 3.5–5.0)
Anion gap: 10 (ref 5–15)
BUN: 73 mg/dL — ABNORMAL HIGH (ref 8–23)
CO2: 21 mmol/L — ABNORMAL LOW (ref 22–32)
Calcium: 8.7 mg/dL — ABNORMAL LOW (ref 8.9–10.3)
Chloride: 94 mmol/L — ABNORMAL LOW (ref 98–111)
Creatinine, Ser: 10.63 mg/dL — ABNORMAL HIGH (ref 0.44–1.00)
GFR, Estimated: 4 mL/min — ABNORMAL LOW (ref >60)
Glucose, Bld: 153 mg/dL — ABNORMAL HIGH (ref 70–99)
Phosphorus: 4.5 mg/dL (ref 2.5–4.6)
Potassium: 5.4 mmol/L — ABNORMAL HIGH (ref 3.5–5.1)
Sodium: 125 mmol/L — ABNORMAL LOW (ref 135–145)

## 2020-06-26 LAB — CBC
HCT: 24.4 % — ABNORMAL LOW (ref 36.0–46.0)
Hemoglobin: 7.4 g/dL — ABNORMAL LOW (ref 12.0–15.0)
MCH: 26 pg (ref 26.0–34.0)
MCHC: 30.3 g/dL (ref 30.0–36.0)
MCV: 85.6 fL (ref 80.0–100.0)
Platelets: 586 10*3/uL — ABNORMAL HIGH (ref 150–400)
RBC: 2.85 MIL/uL — ABNORMAL LOW (ref 3.87–5.11)
RDW: 17.6 % — ABNORMAL HIGH (ref 11.5–15.5)
WBC: 14.8 10*3/uL — ABNORMAL HIGH (ref 4.0–10.5)
nRBC: 0 % (ref 0.0–0.2)

## 2020-06-26 LAB — GLUCOSE, CAPILLARY
Glucose-Capillary: 127 mg/dL — ABNORMAL HIGH (ref 70–99)
Glucose-Capillary: 113 mg/dL — ABNORMAL HIGH (ref 70–99)

## 2020-06-26 MED ORDER — HEPARIN SODIUM (PORCINE) 1000 UNIT/ML DIALYSIS: 20.0000 [IU]/kg | INTRAMUSCULAR | Status: DC | PRN

## 2020-06-26 MED ORDER — DOXERCALCIFEROL 4 MCG/2ML IV SOLN
INTRAVENOUS | Status: AC
  Administered 2020-06-26: 1.5 ug via INTRAVENOUS
  Filled 2020-06-26: qty 2

## 2020-06-26 NOTE — Progress Notes (Addendum)
[facility] Progress Note   Subjective:   Patient seen and examined in room.  In wheelchair.  Denies CP, SOB and n/v/d.  Reports bleeding from abdominal injections.   Objective Vitals:   06/25/20 0348 06/25/20 1351 06/25/20 1939 06/26/20 0505  BP: (!) 134/54 140/69 (!) 154/64 (!) 142/51  Pulse: 63 67 65 74  Resp: '20 17 20 20  '$ Temp: 98.1 F (36.7 C) 98 F (36.7 C) 97.7 F (36.5 C) 98.4 F (36.9 C)  TempSrc: Oral   Oral  SpO2: 95% 100% 97% 97%  Weight: 102.3 kg   103.7 kg  Height:       Physical Exam General:chroncially ill appearing female in NAD in wheelchair Heart:RRR, no mrg Lungs:CTAB, nml WOB Abdomen:soft, NTND Extremities:L BKA dressed, no LE edema Dialysis Access: RU AVG +b   Filed Weights   06/24/20 0451 06/25/20 0348 06/26/20 0505  Weight: 102.1 kg 102.3 kg 103.7 kg    Intake/Output Summary (Last 24 hours) at 06/26/2020 1017 Last data filed at 06/26/2020 0839 Gross per 24 hour  Intake 720 ml  Output --  Net 720 ml    Additional Objective Labs: Basic Metabolic Panel: Recent Labs  Lab 06/21/20 1426 06/22/20 0506 06/23/20 1226  NA 124* 129* 126*  K 4.6 4.3 4.9  CL 86* 93* 90*  CO2 '22 26 23  '$ GLUCOSE 158* 106* 124*  BUN 59* 38* 60*  CREATININE 9.33* 6.82* 9.28*  CALCIUM 8.8* 8.3* 8.6*  PHOS 5.6* 4.0 5.1*   Liver Function Tests: Recent Labs  Lab 06/21/20 1426 06/22/20 0506 06/23/20 1226  ALBUMIN 2.6* 2.4* 2.5*   CBC: Recent Labs  Lab 06/19/20 1413 06/20/20 0521 06/21/20 1426 06/22/20 0506 06/23/20 1239  WBC 15.0* 13.3* 15.6* 13.2* 14.9*  NEUTROABS  --  9.4*  --  9.4*  --   HGB 7.6* 7.6* 8.0* 7.6* 7.3*  HCT 24.1* 23.9* 25.1* 24.1* 24.1*  MCV 81.1 81.3 81.8 82.8 84.0  PLT 646* 623* 738* 648* 612*   CBG: Recent Labs  Lab 06/24/20 0628 06/24/20 2021 06/25/20 0459 06/25/20 1706 06/26/20 0503  GLUCAP 111* 159* 95 131* 127*   Medications: . ferric gluconate (FERRLECIT/NULECIT) IV Stopped (06/23/20 1744)   .  (feeding supplement) PROSource Plus  30 mL Oral BID BM  . amLODipine  10 mg Oral QHS  . aspirin EC  81 mg Oral QHS  . atorvastatin  40 mg Oral QHS  . Chlorhexidine Gluconate Cloth  6 each Topical Q0600  . clopidogrel  75 mg Oral Daily  . darbepoetin (ARANESP) injection - DIALYSIS  100 mcg Intravenous Q Fri-HD  . diclofenac Sodium  2 g Topical QID  . doxercalciferol  1.5 mcg Intravenous Q M,W,F-HD  . Dulaglutide  1.5 mg Subcutaneous Weekly  . gabapentin  300 mg Oral QHS  . heparin  5,000 Units Subcutaneous Q8H  . insulin glargine  20 Units Subcutaneous Daily  . lanthanum  1,000 mg Oral TID WC  . metoprolol succinate  50 mg Oral Daily  . multivitamin  1 tablet Oral QHS  . polyethylene glycol  17 g Oral Daily  . Ensure Max Protein  11 oz Oral Daily  . senna-docusate  1 tablet Oral BID  . sevelamer carbonate  2,400 mg Oral TID WC    Dialysis Orders: [facility] MWF 4hrs 2K 2.5Ca EDW 101kg RU AVG Heparin 1000 load and 1000 units qhr EPO 14000 units qHD Venofer '100mg'$  qHD Hectorol 1.25mg qHD   Assessment/Plan: 1. Gangrene L foot->  L BKA 2/10 by VVS:in CIR for therapy.  2. 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 Per primary. 3. Leukocytosis -up and down.Last 14.9. BCxand UCxnegative.  4. Thrombocytosis - improving.  Last plt 612. 5. ESRD: Continue HD per MWF schedule - next later today. 6. HTN/ volume: BP mostly in goal, but with hyponatremia which favors volume excess - ^ UF goal for HD today. Continue homeamlodipineand metoprolol- may need to wean amlodipine. 7. DM2- peradmitting team. 8. Anemiaof ESRD: LastHgb7.3, Aranesp ^ to 164mg - given 2/25, follow.Tsat 24% -orderedshort Fe course. 9. MBDof ESRD-Calciumand phos in goal.Continue renvela and VDRA- hectorol. Is also being given fosrenol-  She says she does not take as OP , will stop  10. Nutrition - Renal diet with fluid restrictions. Protein supplements, Vitamins.    LJen Mow  PA-C [facility] 06/26/2020,10:17 AM  LOS: 10 days    Patient seen and examined, agree with above note with above modifications. No complaints, doing well with rehab-  Thinks she will go home on 3/8.  NO issues with HD but with hyponatremia I agree will try to UF more,  If BP issues will wean amlodipine  KCorliss Parish MD 06/26/2020

## 2020-06-26 NOTE — Progress Notes (Signed)
Occupational Therapy Session Note  Patient Details  Name: GALE KLAR MRN: [id number] Date of Birth: 1955-07-02  Today's Date: 06/26/2020 OT Individual Time: 1130-1200 OT Individual Time Calculation (min): 30 min    Short Term Goals: Week 1:  OT Short Term Goal 1 (Week 1): Pt will complete BSC transfer with CGA + AE PRN OT Short Term Goal 1 - Progress (Week 1): Met OT Short Term Goal 2 (Week 1): Pt will complete standing bimanual ADL  for >2 min with CGA + AE PRN. OT Short Term Goal 2 - Progress (Week 1): Progressing toward goal OT Short Term Goal 3 (Week 1): Pt will don shirt with close S. OT Short Term Goal 3 - Progress (Week 1): Met OT Short Term Goal 5 (Week 1): Pt will complete toileting tasks wtih Min A using adaptive strategies as needed Week 2:  OT Short Term Goal 1 (Week 2): Pt will complete LB dressing tasks with Min A using adaptive strategies + AE as needed OT Short Term Goal 2 (Week 2): Pt will complete toilet or BSC transfer with close supervision using LRAD  Skilled Therapeutic Interventions/Progress Updates:    Pt greeted at time of session sitting up in wheelchair agreeable to OT session, no pain. Initially discussed DME needs for home, recommendation for drop arm BSC (bariatric if able) and provided print out, therapist demonstrating use at home to decrease risk of shearing on RLE. Transported to gym for time management and performed 1x15 for the following at rebounder: red 1kg ball for tosses, toss + overhead, toss + torso twist. Once back in room, slide board to R side to bed Min A. Sit > supine Min A. Alarm on call bell in reach.   Therapy Documentation Precautions:  Precautions Precautions: Fall Precaution Comments: falls, R 2nd toe wound Required Braces or Orthoses: Knee Immobilizer - Left Knee Immobilizer - Left: Other (comment) (on when mobile) Restrictions Weight Bearing Restrictions: Yes LLE Weight Bearing: Non weight bearing     Therapy/Group:  Individual Therapy  Viona Gilmore 06/26/2020, 7:27 AM

## 2020-06-26 NOTE — Progress Notes (Signed)
Occupational Therapy Weekly Progress Note  Patient Details  Name: Christy Zhang MRN: [id number] Date of Birth: [birth year]  Beginning of progress report period: 06/17/2020 End of progress report period: 06/26/2020  Today's Date: 06/26/2020 OT Individual Time: 1939-3672 OT Individual Time Calculation (min): 56 min   Patient has met 2 of 3 short term goals.  Pt has made functional progress at time of report. At time of evaluation pt completed stand pivot toilet transfers with Mod A, progressing to now completing stated transfers with CGA. She can also complete LB dressing with Mod A using AE as needed, whereas at time of evaluation pt required Max A for LB dressing. Education regarding lateral lean techniques during LB self care/toileting and AE use is ongoing. D/c planning and amputee care education is also ongoing. Continue OT POC.      Patient continues to demonstrate the following deficits: muscle weakness, decreased cardiorespiratoy endurance, decreased problem solving and decreased standing balance and decreased balance strategies and therefore will continue to benefit from skilled OT intervention to enhance overall performance with BADL.  Patient progressing toward long term goals..  Continue plan of care.  OT Short Term Goals Week 1:  OT Short Term Goal 1 (Week 1): Pt will complete BSC transfer with CGA + AE PRN OT Short Term Goal 1 - Progress (Week 1): Met OT Short Term Goal 2 (Week 1): Pt will complete standing bimanual ADL  for >2 min with CGA + AE PRN. OT Short Term Goal 2 - Progress (Week 1): Progressing toward goal OT Short Term Goal 3 (Week 1): Pt will don shirt with close S. OT Short Term Goal 3 - Progress (Week 1): Met OT Short Term Goal 5 (Week 1): Pt will complete toileting tasks wtih Min A using adaptive strategies as needed Week 2:  OT Short Term Goal 1 (Week 2): Pt will complete LB dressing tasks with Min A using adaptive strategies + AE as needed OT Short Term Goal 2  (Week 2): Pt will complete toilet or BSC transfer with close supervision using LRAD  Skilled Therapeutic Interventions/Progress Updates:    Pt greeted in the w/c via PT handoff with no c/o pain. Pt requesting to start session by washing her face and completing oral care while sitting at the sink. Setup for these tasks. Afterwards OT educated pt on use of LH mirror for residual limb as well as [name] inspection daily. Discussed importance of incoporating limb inspection during daily routine for amputee/diabetic care. MD in at this time, okay'd removal of KI during therapies and to wear when pt is resting. Once OT rewrapped residual limb pt requested to use the restroom. Min A for squat pivot<drop arm BSC over toilet without use of the grab bar. Pt able to lower both skirt and pull up in prep for toileting with only CGA when given increased time for implementing lateral lean technique. She completed hygiene with supervision with the drop arm component of BSC lowered, pt needing cues for lowering the drop arm herself. Mod A for squat pivot<w/c after, pts skirt getting caught under her cushion when she was scooting into chair and needing assistance/cues for problem solving. OT needed to stabilize DME equipment due to pt moving/shifting equipment during squat pivots as well. We discussed how having manually installed grab bars would optimize her safety during toileting tasks at home, pt reporting that family is planning to install them but unsure when. Reminded pt of her d/c date and that having grab bars  installed before her d/c date is recommended. After handwashing at the sink, pt was educated on how to apply leg rest + limb rest to w/c and remove them from w/c. Pt needing step by step cues and Max A to execute demands of this motor task. She remained sitting up in the w/c at close of session, all needs within reach and safety belt fastened.   Therapy Documentation Precautions:  Precautions Precautions:  Fall Precaution Comments: falls, R 2nd toe wound Required Braces or Orthoses: Knee Immobilizer - Left Knee Immobilizer - Left: Other (comment) (on when mobile) Restrictions Weight Bearing Restrictions: Yes LLE Weight Bearing: Non weight bearing Vital Signs: Therapy Vitals Temp: 98.4 F (36.9 C) Temp Source: Oral Pulse Rate: 74 Resp: 20 BP: (!) 142/51 Patient Position (if appropriate): Lying Oxygen Therapy SpO2: 97 % O2 Device: Room Air Pain: Pain Assessment Pain Scale: 0-10 Pain Score: 2  Pain Type: Acute pain Pain Location: Shoulder Pain Orientation: Left Pain Descriptors / Indicators: Aching Pain Onset: Gradual Pain Intervention(s): Medication (See eMAR) ADL: ADL Eating: Set up Where Assessed-Eating: Wheelchair Grooming: Setup Where Assessed-Grooming: Wheelchair Upper Body Bathing: Supervision/safety Where Assessed-Upper Body Bathing: Wheelchair Lower Body Bathing: Moderate assistance Where Assessed-Lower Body Bathing: Wheelchair Upper Body Dressing: Minimal assistance Where Assessed-Upper Body Dressing: Wheelchair Lower Body Dressing: Moderate assistance Where Assessed-Lower Body Dressing: Wheelchair Toileting: Moderate assistance Where Assessed-Toileting: Bedside Commode Toilet Transfer: Moderate assistance Toilet Transfer Method: Stand pivot Toilet Transfer Equipment: Radiographer, therapeutic: Not assessed Social research officer, government: Not assessed (Does not shower at baseline)      Therapy/Group: Individual Therapy  Mihaela Fajardo A Graycen Degan 06/26/2020, 7:25 AM

## 2020-06-26 NOTE — Consult Note (Signed)
Neuropsychological Consultation   Patient:   Christy Zhang   DOB:   [birth year]  MR Number:  [id number]  Location:  [facility] [address] CENTER [address] [place] [id number] [place] [address] Dept: [place]: [phone]           Date of Service:   06/26/2020  Start Time:   10 AM End Time:   10:30 AM  Provider/Observer:  Ilean Skill, Psy.D.       Clinical Neuropsychologist       Billing Code/Service: [phone]  Chief Complaint:    Christy Zhang is a 65 year old female with history of end-stage renal disease on dialysis, type 2 diabetes, hypertension, morbid obesity, PVD with gangrenous changes left foot and multiple attempts at limb salvage.  BKA had been recommended in December but delayed due to request for second opinion.  Covid infection inclement weather also slow the process down.  Patient was admitted on 06/08/2020 for left BKA.  Patient postsurgically had lower left extremity pain, and antibiotic treatment.  Patient is continuing to be followed and managed with hemodialysis.  Patient with ongoing deficits limited by fatigue, balance deficits etc. following left BKA with CIR recommended due to functional decline.  Reason for Service:  Patient was referred for neuropsychological consultation due to coping and adjustment issues following left BKA.  Below is the HPI for the current admission.  [id number] B. Mcwhinney is a 65 year old female with history of ESRD-HD MWF, T2DM, HTN, morbid obesity--BMI 39, PVD with gangrenous changes left foot and multiple attempts at limb salvage. BKA was recommended in December but delayed to to request for second opinion, Covid infection and inclement weather. She was admitted on 06/08/20 for L-BKA by Dr. Stanford Breed. Post op course significant for LLE pain, leucocytosis with rise in WBC to 24.2--blood cultures X 2 negative. She was maintained on pre-op cefazolin thorough 02/13 and  metoprolol resumed as bradycardia resolved. Nephrology following for management of HD MWF. Therapy has been ongoing and patient limited by fatigue, L-BKA and balance deficits affecting ADLs and mobility. CIR recommended due to functional decline.  Current Status:  The patient was alert and oriented sitting up in her wheelchair.  Patient has been actively participating in therapeutic interventions and denies any significant depression or anxiety types of symptomatology.  Patient reports that she did have some more fear during the delay in her surgery about both potential loss of function due to amputation but also the potential for developing a significant infection leading to sepsis.  Patient reports that she is doing well coping with her surgical intervention.  Behavioral Observation: Christy Zhang  presents as a 65 y.o.-year-old Right African American Female who appeared her stated age. her dress was Appropriate and she was Well Groomed and her manners were Appropriate to the situation.  her participation was indicative of Appropriate and Redirectable behaviors.  There were physical disabilities noted.  she displayed an appropriate level of cooperation and motivation.     Interactions:    Active Appropriate  Attention:   abnormal and attention span appeared shorter than expected for age  Memory:   within normal limits; recent and remote memory intact  Visuo-spatial:  not examined  Speech (Volume):  low  Speech:   normal; normal  Thought Process:  Coherent and Relevant  Though Content:  WNL; not suicidal and not homicidal  Orientation:   person, place, time/date and situation  Judgment:   Good  Planning:   Fair  Affect:    Appropriate  Mood:    Dysphoric  Insight:   Good  Intelligence:   normal  Medical History:   Past Medical History:  Diagnosis Date  . Anemia   . Arthritis   . Asthma    in the past  . CHF (congestive heart failure) ([place])   . Chronic kidney disease     Renal failure, dialysis on M/W/F  . 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 End stage renal disease ([place])   . End stage renal disease on dialysis ([place])   . Hemodialysis access, arteriovenous graft ([place])   . Hyperlipidemia   . Hypertension   . Neuromuscular disorder [facility])          Patient Active Problem List   Diagnosis Date Noted  . Below-knee amputation of left lower extremity ([place]) 06/16/2020  . S/P BKA (below knee amputation) unilateral ([place]) 06/08/2020  . Gangrene ([place])   . Abdominal wall cellulitis   . Panniculitis 12/29/2019  . Osteomyelitis ([place]) 10/15/2019  . Hyperglycemia due to diabetes mellitus ([place]) 10/15/2019  . Osteomyelitis of third toe of right foot ([place]) 10/14/2019  . Controlled type 2 diabetes mellitus with chronic kidney disease on chronic dialysis, with long-term current use of insulin ([place]) 12/02/2016  . ESRD (end stage renal disease) ([place])   . Asthma, mild persistent 07/25/2015  . Hypertension secondary to other renal disorders 07/25/2015  . Diabetes ([place]) 03/30/2015  . Anemia in chronic kidney disease 02/17/2015  . 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 Essential (primary) hypertension 06/24/2014  . Heart failure ([place]) 06/24/2014  . Obesity, Class III, BMI 40-49.9 (morbid obesity) ([place]) 06/24/2014     Psychiatric History:  No prior psychiatric history.  Family Med/Psych History:  Family History  Problem Relation Age of Onset  . Diabetes Mother   . Hypertension Mother   . Cancer Father   . Diabetes Father   . Hypertension Sister   . Diabetes Daughter   . Heart disease Daughter   . Hypertension Daughter   . Diabetes Son   . Heart disease Son   . Hypertension Son   . Peripheral vascular disease Son        amputation    Impression/DX:  Christy Zhang is a 65 year old female with history of end-stage renal disease on dialysis, type 2 diabetes, hypertension, morbid obesity, PVD with  gangrenous changes left foot and multiple attempts at limb salvage.  BKA had been recommended in December but delayed due to request for second opinion.  Covid infection inclement weather also slow the process down.  Patient was admitted on 06/08/2020 for left BKA.  Patient postsurgically had lower left extremity pain, and antibiotic treatment.  Patient is continuing to be followed and managed with hemodialysis.  Patient with ongoing deficits limited by fatigue, balance deficits etc. following left BKA with CIR recommended due to functional decline.  The patient was alert and oriented sitting up in her wheelchair.  Patient has been actively participating in therapeutic interventions and denies any significant depression or anxiety types of symptomatology.  Patient reports that she did have some more fear during the delay in her surgery about both potential loss of function due to amputation but also the potential for developing a significant infection leading to sepsis.  Patient reports that she is doing well coping with her surgical intervention.  Disposition/Plan:  Today we worked on coping and adjustment following  her left TKA.  The patient appears to be doing well with appropriate emotional response and doing well medically.  Patient denies any significant or severe depression or anxiety.  Patient will be discharged on 3/5.  Diagnosis:    Left BKA        Electronically Signed   _______________________ Ilean Skill, Psy.D. Clinical Neuropsychologist

## 2020-06-26 NOTE — Progress Notes (Signed)
Occupational Therapy Session Note  Patient Details  Name: Christy Zhang MRN: [id number] Date of Birth: [birth year]  Today's Date: 06/26/2020 OT Individual Time: 1030-1115 OT Individual Time Calculation (min): 45 min    Short Term Goals: Week 1:  OT Short Term Goal 1 (Week 1): Pt will complete BSC transfer with CGA + AE PRN OT Short Term Goal 1 - Progress (Week 1): Met OT Short Term Goal 2 (Week 1): Pt will complete standing bimanual ADL  for >2 min with CGA + AE PRN. OT Short Term Goal 2 - Progress (Week 1): Progressing toward goal OT Short Term Goal 3 (Week 1): Pt will don shirt with close S. OT Short Term Goal 3 - Progress (Week 1): Met OT Short Term Goal 5 (Week 1): Pt will complete toileting tasks wtih Min A using adaptive strategies as needed Week 2:  OT Short Term Goal 1 (Week 2): Pt will complete LB dressing tasks with Min A using adaptive strategies + AE as needed OT Short Term Goal 2 (Week 2): Pt will complete toilet or BSC transfer with close supervision using LRAD  Skilled Therapeutic Interventions/Progress Updates:    Pt received in wc agreeable to therapy. Pt dressed and ready for the day.  Pt transported to orthogym to work on [employer] transfers. Encouraged her to scoot vs slide on the board which she was able to do fairly well but with minimal hip clearance.    On mat, focused on UE strength to help with hip lift. Tried having her push up on yoga blocks, but unable to lift at all then push up blocks with only a slight lift (even with pushing down through R leg).  Moved onto tricep strengthening with theraband.  Reviewed exercises to do in room with band for back extension to avoid kyphotic posture (as she plans to sleep in recliner at home).  Also had her practice tricep strength with pushing arm out against band.  Dynamic reach and torso strengthening with reaching out of her BOS in various directions followed by resisted pulling and rotation.    She completed slide  board back to wc with CGA.  Pt transported back to room with belt alarm on and all needs met.   Therapy Documentation Precautions:  Precautions Precautions: Fall Precaution Comments: falls, R 2nd toe wound Required Braces or Orthoses: Knee Immobilizer - Left Knee Immobilizer - Left: Other (comment) (on when mobile) Restrictions Weight Bearing Restrictions: Yes LLE Weight Bearing: Non weight bearing  Pain: Pain Assessment Pain Score: 0-No pain ADL: ADL Eating: Set up Where Assessed-Eating: Wheelchair Grooming: Setup Where Assessed-Grooming: Wheelchair Upper Body Bathing: Supervision/safety Where Assessed-Upper Body Bathing: Wheelchair Lower Body Bathing: Moderate assistance Where Assessed-Lower Body Bathing: Wheelchair Upper Body Dressing: Minimal assistance Where Assessed-Upper Body Dressing: Wheelchair Lower Body Dressing: Moderate assistance Where Assessed-Lower Body Dressing: Wheelchair Toileting: Moderate assistance Where Assessed-Toileting: Bedside Commode Toilet Transfer: Moderate assistance Toilet Transfer Method: Stand pivot Toilet Transfer Equipment: Radiographer, therapeutic: Not assessed Social research officer, government: Not assessed (Does not shower at baseline)  Therapy/Group: Individual Therapy  Sarit Sparano 06/26/2020, 12:50 PM

## 2020-06-26 NOTE — Progress Notes (Signed)
Physical Therapy Weekly Progress Note  Patient Details  Name: Christy Zhang MRN: [id number] Date of Birth: 08-Dec-1955  Beginning of progress report period: June 17, 2020 End of progress report period: June 26, 2020  Today's Date: 06/26/2020 PT Individual Time: 0800-0856 PT Individual Time Calculation (min): 56 min   Pt continues to progress toward all goals set and no additional STG set 2/2 ELOS. Pt currently requires min A-mod A for transfers with Rolling walker or slide board. Pt also has progressed to SBA with WC mobility for 16' with needs for rest breaks and CGA-min A for bed mobility  Patient continues to demonstrate the following deficits muscle weakness and decreased cardiorespiratoy endurance and therefore will continue to benefit from skilled PT intervention to increase functional independence with mobility.  Patient progressing toward long term goals..  Continue plan of care.  PT Short Term Goals Week 2:  PT Short Term Goal 1 (Week 2): STG=LTG due to ELOS  Skilled Therapeutic Interventions/Progress Updates:    pt received in bed and agreeable to therapy. Pt reported 4/10 pain in L shoulder reporting she thinks it is from her fall last week but unsure. Pt directed in supine>sit from flat bed CGA, SBA for sitting EOB. Pt then directed in slide board transfer to [facility] with x3 attempts however pt unable to effectively scoot despite visual demonstration, VC and mod A. Pt demonstrated only scooting forward and unsafe ability to remain on board. Pt educated on importance of this type of transfer for skin integrity of foot and verbalized understanding but reported she preferred Stand pivot transfer but agreeable to continue to attempt slide board. Pt then directed in Stand pivot transfer with Rolling walker min A with VC for completely turning before sitting in WC and to attempt to hop however pt unable to clear floor and reports increased pain in L shoulder with this and after  attempting with slide board. Pt then directed in [place] mobility 75' supervision with poor cadence noted and VC for technique, pt required total A to complete rest of distance to day room for therapy session. Pt then directed in seated BLE strengthening exercises 2# 3x15 marching/leg lifts, LAQ on RLE only and B hip abduction and adduction. Pt then directed in slide board transfer to mat table at min A with slide board placed by PT. Pt then directed in hip flexor stretching in semi reclined position 4x45s with reclined onto yoga ball as pt reported she was unable to lay flat. Pt then directed in modified crunches from yoga ball 2x10 for improved core strength. Pt then returned to [facility] with slide obard transfer min A. Pt directed in [place] mobility for 75' SBA. PT completed rest of distance to room for time and energy. Pt requested to remain in [facility], All needs in reach and in good condition. Call light in hand.    Therapy Documentation Precautions:  Precautions Precautions: Fall Precaution Comments: falls, R 2nd toe wound Required Braces or Orthoses: Knee Immobilizer - Left Knee Immobilizer - Left: Other (comment) (on when mobile) Restrictions Weight Bearing Restrictions: Yes LLE Weight Bearing: Non weight bearing General:   Vital Signs:  Pain: Pain Assessment Pain Scale: 0-10 Pain Score: 0-No pain Vision/Perception     Mobility:   Locomotion :    Trunk/Postural Assessment :    Balance:   Exercises:   Other Treatments:     Therapy/Group: Individual Therapy  [name] 06/26/2020, 10:20 AM

## 2020-06-26 NOTE — Progress Notes (Signed)
PROGRESS NOTE   Subjective/Complaints: Christy Zhang has no complaints this morning She is working with OT She denies pain.  OT notes that knee immobilizer is not fitting well.  ROS:   Pt denies SOB, abd pain, CP, N/V/C/D, and vision changes   Objective:   No results found. Recent Labs    06/26/20 1255  WBC 14.8*  HGB 7.4*  HCT 24.4*  PLT 586*   Recent Labs    06/26/20 1255  NA 125*  K 5.4*  CL 94*  CO2 21*  GLUCOSE 153*  BUN 73*  CREATININE 10.63*  CALCIUM 8.7*    Intake/Output Summary (Last 24 hours) at 06/26/2020 1937 Last data filed at 06/26/2020 1732 Gross per 24 hour  Intake 480 ml  Output 3500 ml  Net -3020 ml        Physical Exam: Vital Signs Blood pressure (!) 161/56, pulse 76, temperature 98.1 F (36.7 C), temperature source Oral, resp. rate 16, height '5\' 3"'$  (1.6 m), weight 101.8 kg, SpO2 100 %.  Gen: no distress, normal appearing HEENT: oral mucosa pink and moist, NCAT Cardio: Reg rate Chest: normal effort, normal rate of breathing Abd: soft, non-distended Ext: no edema Psych: pleasant, normal affect Skin: intact Neurological: Ox3 Skin: intact except LEs- L BKA and R toes- gangrene Musculoskeletal: TTP over L shoulder anterior top and posterior Cervical back: Normal range of motion.  Comments: L BKA has retention sutures and staples- a little sanguinous drainage on dressing, nothing active- looks good- no dog ears, swelling improved- wearing ACE wraps and KI- looks the same as Tuesday except slightly open on 1 side- steristrips placed Skin: General: Skin is warm.  Comments: Chronic changes in RLE, 3rd digit wrapped in kerlex; 2nd and 4th toe have dry gangrene Skin very dry in foot/leg. Neuro: Ox3  Cranial nerve exam unremarkable. UE motor 4+ to 5/5. RLE: 3+/5 HF, 4/5 KE and 3-4/5 ADF/PF. LLE: able to lift leg off bed about 6". Decreased LT RLE  distally.    Assessment/Plan: 1. Functional deficits which require 3+ hours per day of interdisciplinary therapy in a comprehensive inpatient rehab setting.  Physiatrist is providing close team supervision and 24 hour management of active medical problems listed below.  Physiatrist and rehab team continue to assess barriers to discharge/monitor patient progress toward functional and medical goals  Care Tool:  Bathing    Body parts bathed by patient: Face,Right arm,Chest,Left arm,Abdomen,Right upper leg,Front perineal area,Left upper leg   Body parts bathed by helper: Buttocks,Right lower leg Body parts n/a: Left lower leg   Bathing assist Assist Level: Moderate Assistance - Patient 50 - 74%     Upper Body Dressing/Undressing Upper body dressing   What is the patient wearing?: Pull over shirt    Upper body assist Assist Level: Supervision/Verbal cueing    Lower Body Dressing/Undressing Lower body dressing      What is the patient wearing?: Skirt,Underwear/pull up     Lower body assist Assist for lower body dressing: Moderate Assistance - Patient 50 - 74%     Toileting Toileting    Toileting assist Assist for toileting: Moderate Assistance - Patient 50 - 74%  Transfers Chair/bed transfer  Transfers assist     Chair/bed transfer assist level: Minimal Assistance - Patient > 75% (slide board) Chair/bed transfer assistive device: Sliding board   Locomotion Ambulation   Ambulation assist   Ambulation activity did not occur: Safety/medical concerns  Assist level: Contact Guard/Touching assist Assistive device: Parallel bars Max distance: 8'   Walk 10 feet activity   Assist  Walk 10 feet activity did not occur: Safety/medical concerns        Walk 50 feet activity   Assist Walk 50 feet with 2 turns activity did not occur: Safety/medical concerns         Walk 150 feet activity   Assist Walk 150 feet activity did not occur: Safety/medical  concerns         Walk 10 feet on uneven surface  activity   Assist Walk 10 feet on uneven surfaces activity did not occur: Safety/medical concerns         Wheelchair     Assist Will patient use wheelchair at discharge?: Yes Type of Wheelchair: Manual    Wheelchair assist level: Supervision/Verbal cueing Max wheelchair distance: 100    Wheelchair 50 feet with 2 turns activity    Assist        Assist Level: Supervision/Verbal cueing   Wheelchair 150 feet activity     Assist      Assist Level: Supervision/Verbal cueing   Blood pressure (!) 161/56, pulse 76, temperature 98.1 F (36.7 C), temperature source Oral, resp. rate 16, height '5\' 3"'$  (1.6 m), weight 101.8 kg, SpO2 100 %.  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 -patient may showerif residual limb is water-proofed -ELOS/Goals: 7-10 days, Mod I at w/c level  -Continue CIR 2. Antithrombotics: -DVT/anticoagulation:Pharmaceutical:Continue Heparin -antiplatelet therapy: ASA/Plavix 3. Pain Management:ContinueOxycodone prn--received 2 doses 2/20  2/21- having more phantom pain than anything- on no phantom pain meds- will start Gabapentin 300 mg QHS for now- since on HD- max dose we can usually use.   2/22- pain better today- con't regimen  2/24- pt reports some early AM hallucinations- will change to Norco for pain- if doesn't improve, will need to decrease Gabapentin dose  2/25- doesn't report any hallucinations and Norco working well for her- for pain control- con't regimen  2/26- pain doing well except L shoulder- will start Voltaren gel QID for L shoulder  2/27-2/8- denied pain this AM- continue regimen 4. Mood:LCSW to follow for evaluation and support. -antipsychotic agents: N/A 5. Neuropsych: This patientiscapable of making decisions on herown  behalf. 6. Skin/Wound Care:Protein supplement for hypoalbuminemia  --monitor wound for healing.  -apply dry dressing and ACE to residual limb -apply small amount of gauze to protect gangrenous second toe which is about to auto-amputate   2/22- L BKA looking better- healing; stable in RLE/toes- doesn't appear to be source of Leukocytosis  2/28: ordered new knee immobilizer as current not fitting well. 7. Fluids/Electrolytes/Nutrition:Strict I/O. Labs with HD. 1200cc/FR. 8. ESRD: Schedule HD at the end of the day on MWF to help with tolerance of therapy.  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   -Cr increased to 11.13 on 2/19- appreciate management by nephrology 9. T2DM with nephropathy: Hgb A1c- 6.4 on last check. Was on Lantus 123XX123 units with Trulicity 1.5 mg/wk.  --Monitor BS ac/hs and use SSI for elevated BS --Continue to titrate Lantus to home dose as indicated   2/27- BGs  [id number]- doing great- con't regimen 10 HTN: Monitor BP tid. Has been labile --Continue Metoprolol and amlodipine.  2/25- BP 120s/60s-70s- well controlled- con't meds per Renal 11. PVD: Continue Plavix, ASA and Lipitor 12. Morbid obesity: BMI 39.01. --educate on diet as well as importance of weight loss to help promote health and mobility.  13. Anemia of chronic disease: Being managed with weekly Aranesp.  14. Thrombocytosis: Continue to trend--improved from peak of 774-->600  2/26- Plts 612k- stable- con't to monitor  15. Leukocytosis: discussed with patient that WBC is elevated. She is asymptomatic and without fever, repeat with next dialysis labs to trend  2/21- WBC up to 15k-will check U/A and Cx since pt is oliguric, not anuric- most likely cause vs her dry gangrene of R foot/toes- will reassess in Am and recheck CBC/renal fxn panel in AM- even though it's not at HD, need the results.    2/22- WBC down to 13.3k - U/A and Cx is pending. D/w nursing  2/24- WBC went up to 15.6 yesterday- back down to 13k today/this AM- will do additional ID w/u including blood Cx's.  2/25- Blood Cx's pending; Urine Cx no growth- WBC 14.9 today- asymptomatic- pt says she remembers that she usually has elevated WBC for an unknown reason. If blood C'xs negative, will ask her PCP to address after d/c.    2/27- no growth x 2 days so far- will con't to monitor  2/28: WBC stable, continue to monitor. 16. Hyperkalemia/Hyponatremia  2/21- will d/w renal- NA down to 126 and K+ up to 5.6.   2/22- K+ down to 4.0 and Na up to 130- con't per renal  2/24- NA 129- up from 124 and K+ is 4.3- per renal.   2/25- Na 126 today- per renal- thank you for assistance.   LOS: 10 days A FACE TO FACE EVALUATION WAS PERFORMED  Christy Zhang 06/26/2020, 7:37 PM

## 2020-06-27 LAB — CULTURE, BLOOD (ROUTINE X 2)
Culture: NO GROWTH
Culture: NO GROWTH

## 2020-06-27 LAB — GLUCOSE, CAPILLARY
Glucose-Capillary: 82 mg/dL (ref 70–99)
Glucose-Capillary: 153 mg/dL — ABNORMAL HIGH (ref 70–99)

## 2020-06-27 MED ORDER — DARBEPOETIN ALFA 200 MCG/0.4ML IJ SOSY
200.0000 ug | PREFILLED_SYRINGE | INTRAMUSCULAR | Status: DC
  Administered 2020-06-30: 200 ug via INTRAVENOUS
  Filled 2020-06-27: qty 0.4

## 2020-06-27 MED ORDER — CHLORHEXIDINE GLUCONATE CLOTH 2 % EX PADS: 6.0000 | MEDICATED_PAD | Freq: Every day | CUTANEOUS | Status: DC

## 2020-06-27 NOTE — Progress Notes (Signed)
Physical Therapy Session Note  Patient Details  Name: Christy Zhang MRN: [id number] Date of Birth: [birth year]  Today's Date: 06/27/2020 PT Individual Time: 0900-1000 PT Individual Time Calculation (min): 60 min   Short Term Goals: Week 1:  PT Short Term Goal 1 (Week 1): STG=LTG due to ELOS PT Short Term Goal 1 - Progress (Week 1): Not met Week 2:  PT Short Term Goal 1 (Week 2): STG=LTG due to ELOS  Skilled Therapeutic Interventions/Progress Updates:    pt received in bed and agreeable to therapy. Pt denied pain at start of session. Pt directed in supine>sit from semi-reclined bed and with bedrails used, CGA. Pt then directed in Stand pivot transfer with Rolling walker mod A with pt reporting this was her first transfer of the day and she felt tired. Pt then directed in WC mobility 150' CGA with VC for propulsion technique and effectiveness. Pt taken off unit with nursing aware and agreeable, for improved WC mobility with emphasis on multiple turns and mobility with various obstacles, completed overall 125'+175' CGA. Pt then returned to unit total A for time and energy. Pt and PT discussed pt's DC plan home with daughter for 24/7 assist/supervision. Pt reported she sleeps in a recliner at home and does not know if her WC has swing away armrests for slide board transfer setup or if her walker can fit in the bathroom doorway. Pt returned to room and attempted to call daughter x2 however did not answer. Pt reports she forgot to ask her yesterday. PT educated pt that we are recommending a slide board for home and majority of transfers for RLE skin integrity and for pt to wear shoe on R foot and a WC with swing back arm rests for slide board use and potentially a hospital bed pending progress. Pt agreeable to all. Pt reports daughter transports her to HD in car and pt directed in car transfer from lowest height per pt's report of daughter's car, and min A for Stand pivot transfer to car seat, and pt  unable to achieve standing from lowest height, required increased car height and then min A to Stand pivot transfer with Rolling walker back to WC. Pt reported she felt more tired at this point in therapy. Pt educated about potentially having transportation scheduled for HD. Pt agreed if able. Pt then directed in [facility] mobility back to room 50' CGA. PT completed rest of distance total A for time and energy. Pt reported 3/10 pain in L residual limb. Pt requested to remain in [facility], All needs in reach and in good condition. Call light in hand.  And alarm set.   Therapy Documentation Precautions:  Precautions Precautions: Fall Precaution Comments: falls, R 2nd toe wound Required Braces or Orthoses: Knee Immobilizer - Left Knee Immobilizer - Left: Other (comment) (on when mobile) Restrictions Weight Bearing Restrictions: Yes LLE Weight Bearing: Non weight bearing General:   Vital Signs: Therapy Vitals Temp: 97.6 F (36.4 C) Temp Source: Axillary Pulse Rate: 73 Resp: 19 BP: (!) 144/58 Patient Position (if appropriate): Lying Oxygen Therapy SpO2: 97 % Pain: Pain Assessment Pain Scale: 0-10 Pain Score: 0-No pain Faces Pain Scale: No hurt Pain Type: Acute pain Pain Location: Leg (Simultaneous filing. User may not have seen previous data.) Pain Orientation: Left (Simultaneous filing. User may not have seen previous data.) Pain Descriptors / Indicators: Aching Pain Frequency: Intermittent Pain Onset: On-going Patients Stated Pain Goal: 3 Pain Intervention(s): Medication (See eMAR);Pain med given for lower pain score than  stated, per patient request (norco given prior to therapy) Mobility:   Locomotion :    Trunk/Postural Assessment :    Balance:   Exercises:   Other Treatments:      Therapy/Group: Individual Therapy  [name] 06/27/2020, 10:53 AM

## 2020-06-27 NOTE — Progress Notes (Addendum)
Patient ID: MONQUE HAGGAR, female   DOB: Oct 28, 1955, 65 y.o.   MRN: [id number]  Met with pt and left a message for daughter-[name] to discuss team conference progress this week-still min level and discharge 3/8. Will try to schedule family education she reports [name] stays with her but has a one year old she cares for. May need to get other daughter-[name] to do family education since she is more available. Await return call from daughter.   3:32 PM Spoke with both daughter's-[name] and [name] to discuss need for education. Have scheduled [name] the daughter that stays with and provides care for Friday 3/4 @ 11:00.

## 2020-06-27 NOTE — Progress Notes (Signed)
Occupational Therapy Session Note  Patient Details  Name: Christy Zhang MRN: [id number] Date of Birth: May 18, 1955  Today's Date: 06/27/2020 OT Individual Time: 1335-1435 OT Individual Time Calculation (min): 60 min    Short Term Goals: Week 2:  OT Short Term Goal 1 (Week 2): Pt will complete LB dressing tasks with Min A using adaptive strategies + AE as needed OT Short Term Goal 2 (Week 2): Pt will complete toilet or BSC transfer with close supervision using LRAD  Skilled Therapeutic Interventions/Progress Updates:    Pt up in the wheelchair to start session.  She was able to propel her wheelchair down to the door of the ortho gym with supervision.  She was then able to complete sliding board transfer to the therapy mat with min assist after placement.  Worked on sit to stand from the EOM and standing balance with use of the RW for support.  Min assist for static standing balance while using the RUE to pick up and place wooden pegs in the peg board.  Standing endurance lasted for approximately one minute intervals.  When attempting to let go of the walker with the LUE to place pegs, she was unable to keep her balance for more than 3-5 seconds before needing to sit down secondary to the right knee buckling.  She completed stand pivot transfer to the wheelchair with mod assist.  She was unable to get squared up to the wheelchair before fatiguing and needing to sit.  Finished activity with further UE strengthening with use of the ergonometer.  She was able to complete 3 sets of 5 mins with resistance set at level 7 and RPMs maintained at 18-25 with each set.  Returned to the room at conclusion of exercises with pt choosing to stay sitting up in the wheelchair with the call button and phone in reach with safety belt in place.        Therapy Documentation Precautions:  Precautions Precautions: Fall Precaution Comments: falls, R 2nd toe wound Required Braces or Orthoses: Knee Immobilizer -  Left Knee Immobilizer - Left: Other (comment) Restrictions Weight Bearing Restrictions: Yes LLE Weight Bearing: Non weight bearing  Pain: Pain Assessment Pain Scale: 0-10 Pain Score: 0-No pain ADL: See Care Tool Section for some details of mobility and selfcare  Therapy/Group: Individual Therapy  Marisol Giambra OTR/L 06/27/2020, 3:38 PM

## 2020-06-27 NOTE — Patient Care Conference (Signed)
Inpatient RehabilitationTeam Conference and Plan of Care Update Date: 06/27/2020   Time: 11:30 AM    Patient Name: Christy Zhang      Medical Record Number: [id number]  Date of Birth: December 04, 1955 Sex: Female         Room/Bed: 4M07C/4M07C-01 Payor Info: Payor: MEDICARE / Plan: MEDICARE PART A AND B / Product Type: *No Product type* /    Admit Date/Time:  06/16/2020  3:32 PM  Primary Diagnosis:  Below-knee amputation of left lower extremity [facility])  Hospital Problems: Principal Problem:   Below-knee amputation of left lower extremity [facility])    Expected Discharge Date: Expected Discharge Date: 07/04/20  Team Members Present: Physician leading conference: Dr. Courtney Heys Care Coodinator Present: Dorthula Nettles, RN, BSN, CRRN;Becky Dupree, LCSW Nurse Present: Mohammed Kindle, RN PT Present: Stacy Gardner, PT OT Present: Lillia Corporal, OT PPS Coordinator present : Gunnar Fusi, SLP     Current Status/Progress Goal Weekly Team Focus  Bowel/Bladder   Pt is continent x2.  Pt will remain continent x2.  Assess q shift and prn.   Swallow/Nutrition/ Hydration             ADL's   Min A bathing EOB using lateral leans, Mod A LB dressing while using AE as needed, CGA stand pivot toilet transfers using the grab bar, Mod A toileting  Min for LB dress/bathe, Superviison transfers  adaptive self care skills, amputee education, caregiver education, functional transfers, dynamic balance   Mobility   CGA bed mob; light min A-CGA STS and min A-mod A SPT; min A SB; WC 150' CGA  supervision bed mob; min A car transfers; 5' supervision gait with RW; 150' WC mod I  increased activity tolerance; promote slide board transfer; strengthening; WC mobility   Communication             Safety/Cognition/ Behavioral Observations            Pain   Pt reports pain is controlled with medication.  Pain will remain <3.  Assess q shift and prn.   Skin   Pt has L BKA and R gangrene toe.  Skin will continue to heal  and remain infection free.  Assess q shift and prn.     Discharge Planning:  Home with daughter who is her PCS aide and provided care prior to admission. Will need more care at discharge from CIR than had at home previously   Team Discussion: Severe right foot changes, 3rd digit about to auto-amputate. Drainage from incision, WBC's elevated, could be from gangrene. Changed Oxy to Norco. Continent B/B, appropriate medication given for pain. Going home with daughter, is eligible for a hospital bed. Patient on target to meet rehab goals: Min assist for bathing doing lateral leans, mod assist dressing. Contact guard/min assist stand pivot to toilet. Need family education. Daughter takes her to HD. Completed a car transfer from what we think is the right height of daughter's car? WC need to be slide board accessible and have an amputation leg rest.  *See Care Plan and progress notes for long and short-term goals.   Revisions to Treatment Plan:  Discontinued Oxy, Ordered Norco for pain.  Teaching Needs: Family education, medication management, pain management, skin/wound care, residual limb care and training, transfer training, gait training, endurance training, weight bearing precautions, safety awareness training.  Current Barriers to Discharge: Inaccessible home environment, Decreased caregiver support, Home enviroment access/layout, Wound care, Lack of/limited family support, Weight, Hemodialysis, Weight bearing restrictions, Medication compliance and  Behavior  Possible Resolutions to Barriers: Continue current medications, provide emotional support.     Medical Summary Current Status: Pain usually controlled with Norc and Gabapentin; continent of bowel; oliguric; autoamputation of R 3rd digit-  Barriers to Discharge: Home enviroment access/layout;Hemodialysis;Weight;Weight bearing restrictions;Wound care  Barriers to Discharge Comments: has PCS worker- who is her daughter; home with  daughter- teaching scoot transfers since RLE gangrene; recliner at daughter's house- no bed. will get hsopital bed. Possible Resolutions to [employer]: con't pain meds- monitor RLE/foot due to gangrene; con't sutures/staples L BKA; focus on wound healing this week and pain control; need amputee pad for L side of manual w/c; pushing transfer board;  d/c 3/8   Continued Need for Acute Rehabilitation Level of Care: The patient requires daily medical management by a physician with specialized training in physical medicine and rehabilitation for the following reasons: Direction of a multidisciplinary physical rehabilitation program to maximize functional independence : Yes Medical management of patient stability for increased activity during participation in an intensive rehabilitation regime.: Yes Analysis of laboratory values and/or radiology reports with any subsequent need for medication adjustment and/or medical intervention. : Yes   I attest that I was present, lead the team conference, and concur with the assessment and plan of the team.   Cristi Loron 06/27/2020, 5:33 PM

## 2020-06-27 NOTE — Progress Notes (Signed)
[name] Progress Note   Subjective:   HD yest-  Removed 3500-  Tolerated well    Objective Vitals:   06/26/20 1924 06/27/20 0307 06/27/20 0500 06/27/20 0726  BP: (!) 161/56 (!) 145/60  (!) 144/58  Pulse: 76 76  73  Resp: '16 16  19  '$ Temp: 98.1 F (36.7 C) 99 F (37.2 C)  97.6 F (36.4 C)  TempSrc: Oral Oral  Axillary  SpO2: 100% 95%  97%  Weight:   103.4 kg   Height:       Physical Exam General:chroncially ill appearing female in NAD in wheelchair Heart:RRR, no mrg Lungs:CTAB, nml WOB Abdomen:soft, NTND Extremities:L BKA dressed, no LE edema Dialysis Access: RU AVG +b   Filed Weights   06/26/20 1401 06/26/20 1732 06/27/20 0500  Weight: 105.4 kg 101.8 kg 103.4 kg    Intake/Output Summary (Last 24 hours) at 06/27/2020 1230 Last data filed at 06/27/2020 [id number] Gross per 24 hour  Intake 480 ml  Output 3500 ml  Net -3020 ml    Additional Objective Labs: Basic Metabolic Panel: Recent Labs  Lab 06/22/20 0506 06/23/20 1226 06/26/20 1255  NA 129* 126* 125*  K 4.3 4.9 5.4*  CL 93* 90* 94*  CO2 26 23 21*  GLUCOSE 106* 124* 153*  BUN 38* 60* 73*  CREATININE 6.82* 9.28* 10.63*  CALCIUM 8.3* 8.6* 8.7*  PHOS 4.0 5.1* 4.5   Liver Function Tests: Recent Labs  Lab 06/22/20 0506 06/23/20 1226 06/26/20 1255  ALBUMIN 2.4* 2.5* 2.4*   CBC: Recent Labs  Lab 06/21/20 1426 06/22/20 0506 06/23/20 1239 06/26/20 1255  WBC 15.6* 13.2* 14.9* 14.8*  NEUTROABS  --  9.4*  --   --   HGB 8.0* 7.6* 7.3* 7.4*  HCT 25.1* 24.1* 24.1* 24.4*  MCV 81.8 82.8 84.0 85.6  PLT 738* 648* 612* 586*   CBG: Recent Labs  Lab 06/25/20 0459 06/25/20 1706 06/26/20 0503 06/26/20 1925 06/27/20 0504  GLUCAP 95 131* 127* 113* 82   Medications: . ferric gluconate (FERRLECIT/NULECIT) IV 125 mg (06/26/20 1628)   . (feeding supplement) PROSource Plus  30 mL Oral BID BM  . amLODipine  10 mg Oral QHS  . aspirin EC  81 mg Oral QHS  . atorvastatin  40 mg Oral QHS  .  Chlorhexidine Gluconate Cloth  6 each Topical Q0600  . clopidogrel  75 mg Oral Daily  . darbepoetin (ARANESP) injection - DIALYSIS  100 mcg Intravenous Q Fri-HD  . diclofenac Sodium  2 g Topical QID  . doxercalciferol  1.5 mcg Intravenous Q M,W,F-HD  . Dulaglutide  1.5 mg Subcutaneous Weekly  . gabapentin  300 mg Oral QHS  . heparin  5,000 Units Subcutaneous Q8H  . insulin glargine  20 Units Subcutaneous Daily  . metoprolol succinate  50 mg Oral Daily  . multivitamin  1 tablet Oral QHS  . polyethylene glycol  17 g Oral Daily  . Ensure Max Protein  11 oz Oral Daily  . senna-docusate  1 tablet Oral BID  . sevelamer carbonate  2,400 mg Oral TID WC    Dialysis Orders: [facility] MWF 4hrs 2K 2.5Ca EDW 101kg RU AVG Heparin 1000 load and 1000 units qhr EPO 14000 units qHD Venofer '100mg'$  qHD Hectorol 1.73mg qHD   Assessment/Plan: 1. Gangrene L foot-> L BKA 2/10 by VVS:in CIR for therapy.  2. 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 Per primary. 3. Leukocytosis -staying elevated slightly 13-15K -  BCxand UCxnegative.  4.  Thrombocytosis - improving.   5. ESRD: Continue HD per MWF schedule via AVG - next tomorrow 3/2 6. HTN/ volume: BP mostly in goal, but with hyponatremia which favors volume excess - ^ UF goal for HD yesterday-   Will also do for tomorrow Continue homeamlodipineand metoprolol- may need to wean amlodipine. 7. DM2- peradmitting team. 8. Anemiaof ESRD: LastHgb7.4, Aranesp ^ to 16mg, will inc again  - giving q fri.Tsat 24% -orderedshort Fe course. 9. 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  10. Nutrition - Renal diet with fluid restrictions. Protein supplements, Vitamins.    KCorliss Parish MD 06/27/2020

## 2020-06-27 NOTE — Progress Notes (Signed)
Occupational Therapy Session Note  Patient Details  Name: SAKSHI SERMONS MRN: [id number] Date of Birth: March 14, 1956  Today's Date: 06/27/2020 OT Individual Time: 5009-3818 OT Individual Time Calculation (min): 58 min    Short Term Goals: Week 1:  OT Short Term Goal 1 (Week 1): Pt will complete BSC transfer with CGA + AE PRN OT Short Term Goal 1 - Progress (Week 1): Met OT Short Term Goal 2 (Week 1): Pt will complete standing bimanual ADL  for >2 min with CGA + AE PRN. OT Short Term Goal 2 - Progress (Week 1): Progressing toward goal OT Short Term Goal 3 (Week 1): Pt will don shirt with close S. OT Short Term Goal 3 - Progress (Week 1): Met OT Short Term Goal 5 (Week 1): Pt will complete toileting tasks wtih Min A using adaptive strategies as needed Week 2:  OT Short Term Goal 1 (Week 2): Pt will complete LB dressing tasks with Min A using adaptive strategies + AE as needed OT Short Term Goal 2 (Week 2): Pt will complete toilet or BSC transfer with close supervision using LRAD  Skilled Therapeutic Interventions/Progress Updates:     Pt greeted at time of session sitting up in wheelchair agreeable to OT session, declined ADL in favor of completing tomorrow morning. Discussion at beginning of session regarding DME needs and upcoming family training with daughter. Pt states her wheelchair does not have retractable arm rests, and has standard BSC no drop arm. Self propel partially to gym with therapist taking over for time, performed reboudner 3x15-20 reps with added overhead press for one round for core and UB strength. Slide board w/c > mat with CGA and performed 1x15-20 reps of BUE strengthening with 4# dowel. Slide board back to w/c same  [place]. Once in room needing to toilet, squat pivot over to [facility] over toilet with Min/CGA in attempt to decrease shear on RLE. Mod A toileting tasks able to reach buttocks with anterior weight shift and lateral leans. Doffed clothing and put on gown in prep for  bed. Squat pivot > wheelchair CGA > bed in same manner. Alarm on call bell in reach.   Therapy Documentation Precautions:  Precautions Precautions: Fall Precaution Comments: falls, R 2nd toe wound Required Braces or Orthoses: Knee Immobilizer - Left Knee Immobilizer - Left: Other (comment) Restrictions Weight Bearing Restrictions: Yes LLE Weight Bearing: Non weight bearing     Therapy/Group: Individual Therapy  Viona Gilmore 06/27/2020, 4:43 PM

## 2020-06-27 NOTE — Progress Notes (Addendum)
PROGRESS NOTE   Subjective/Complaints:  LBM yesterday- had some residual limb pain.  5/10 last night- but meds worked.   No more phantom pain since on Gabapentin.   ROS:   Pt denies SOB, abd pain, CP, N/V/C/D, and vision changes   Objective:   No results found. Recent Labs    06/26/20 1255  WBC 14.8*  HGB 7.4*  HCT 24.4*  PLT 586*   Recent Labs    06/26/20 1255  NA 125*  K 5.4*  CL 94*  CO2 21*  GLUCOSE 153*  BUN 73*  CREATININE 10.63*  CALCIUM 8.7*    Intake/Output Summary (Last 24 hours) at 06/27/2020 1013 Last data filed at 06/27/2020 [id number] Gross per 24 hour  Intake 480 ml  Output 3500 ml  Net -3020 ml        Physical Exam: Vital Signs Blood pressure (!) 144/58, pulse 73, temperature 97.6 F (36.4 C), temperature source Axillary, resp. rate 19, height '5\' 3"'$  (1.6 m), weight 103.4 kg, SpO2 97 %.   General: awake, alert, appropriate, NAD- sitting up in w/c,  HENT: conjugate gaze; oropharynx moist CV: regular rate; no JVD Pulmonary: CTA B/L; no W/R/R- good air movement GI: soft, NT, ND, (+)BS, protuberant Psychiatric: appropriate; quiet spoken Neurological: Ox3  Skin: intact except LEs- L BKA and R toes- gangrene- R 3rd toe about to autoamputate any minute- Pics as below Musculoskeletal: TTP over L shoulder anterior top and posterior Cervical back: Normal range of motion.  Comments: L BKA has retention sutures and staples- a little sanguinous drainage on dressing, nothing active- looks good- no dog ears, swelling improved- wearing ACE wraps and KI- looks the same as Tuesday except slightly open on 1 side- steristrips placed- draining slightly from Lateral aspect of L BKA- on dressing, but not actively draining when dressing off.  Skin: General: Skin is warm.  Comments: Chronic changes in RLE, 3rd digit wrapped in kerlex; 2nd and 4th toe have dry gangrene Skin very dry in  foot/leg. Neuro: Ox3  Cranial nerve exam unremarkable. UE motor 4+ to 5/5. RLE: 3+/5 HF, 4/5 KE and 3-4/5 ADF/PF. LLE: able to lift leg off bed about 6". Decreased LT RLE distally.           Assessment/Plan: 1. Functional deficits which require 3+ hours per day of interdisciplinary therapy in a comprehensive inpatient rehab setting.  Physiatrist is providing close team supervision and 24 hour management of active medical problems listed below.  Physiatrist and rehab team continue to assess barriers to discharge/monitor patient progress toward functional and medical goals  Care Tool:  Bathing    Body parts bathed by patient: Face,Right arm,Chest,Left arm,Abdomen,Right upper leg,Front perineal area,Left upper leg   Body parts bathed by helper: Buttocks,Right lower leg Body parts n/a: Left lower leg   Bathing assist Assist Level: Moderate Assistance - Patient 50 - 74%     Upper Body Dressing/Undressing Upper body dressing   What is the patient wearing?: Pull over shirt    Upper body assist Assist Level: Supervision/Verbal cueing    Lower Body Dressing/Undressing Lower body dressing      What is the patient wearing?: Skirt,Underwear/pull up  Lower body assist Assist for lower body dressing: Moderate Assistance - Patient 50 - 74%     Toileting Toileting    Toileting assist Assist for toileting: Moderate Assistance - Patient 50 - 74%     Transfers Chair/bed transfer  Transfers assist     Chair/bed transfer assist level: Minimal Assistance - Patient > 75% (slide board) Chair/bed transfer assistive device: Sliding board   Locomotion Ambulation   Ambulation assist   Ambulation activity did not occur: Safety/medical concerns  Assist level: Contact Guard/Touching assist Assistive device: Parallel bars Max distance: 8'   Walk 10 feet activity   Assist  Walk 10 feet activity did not occur: Safety/medical concerns        Walk 50 feet  activity   Assist Walk 50 feet with 2 turns activity did not occur: Safety/medical concerns         Walk 150 feet activity   Assist Walk 150 feet activity did not occur: Safety/medical concerns         Walk 10 feet on uneven surface  activity   Assist Walk 10 feet on uneven surfaces activity did not occur: Safety/medical concerns         Wheelchair     Assist Will patient use wheelchair at discharge?: Yes Type of Wheelchair: Manual    Wheelchair assist level: Supervision/Verbal cueing Max wheelchair distance: 100    Wheelchair 50 feet with 2 turns activity    Assist        Assist Level: Supervision/Verbal cueing   Wheelchair 150 feet activity     Assist      Assist Level: Supervision/Verbal cueing   Blood pressure (!) 144/58, pulse 73, temperature 97.6 F (36.4 C), temperature source Axillary, resp. rate 19, height '5\' 3"'$  (1.6 m), weight 103.4 kg, SpO2 97 %.  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 -patient may showerif residual limb is water-proofed -ELOS/Goals: 7-10 days, Mod I at w/c level  -Continue CIR 2. Antithrombotics: -DVT/anticoagulation:Pharmaceutical:Continue Heparin -antiplatelet therapy: ASA/Plavix 3. Pain Management:ContinueOxycodone prn--received 2 doses 2/20  2/21- having more phantom pain than anything- on no phantom pain meds- will start Gabapentin 300 mg QHS for now- since on HD- max dose we can usually use.   2/22- pain better today- con't regimen  2/24- pt reports some early AM hallucinations- will change to Norco for pain- if doesn't improve, will need to decrease Gabapentin dose  2/25- doesn't report any hallucinations and Norco working well for her- for pain control- con't regimen  2/26- pain doing well except L shoulder- will start Voltaren gel QID for L shoulder  3/1- Pain 5/10  last night, but meds worked- con't regimen 4. Mood:LCSW to follow for evaluation and support. -antipsychotic agents: N/A 5. Neuropsych: This patientiscapable of making decisions on herown behalf. 6. Skin/Wound Care:Protein supplement for hypoalbuminemia  --monitor wound for healing.  -apply dry dressing and ACE to residual limb -apply small amount of gauze to protect gangrenous second toe which is about to auto-amputate   2/22- L BKA looking better- healing; stable in RLE/toes- doesn't appear to be source of Leukocytosis  2/28: ordered new knee immobilizer as current not fitting well.  3/1- KI was on wrong per nursing- didn't get new one- put old one on correctly.  7. Fluids/Electrolytes/Nutrition:Strict I/O. Labs with HD. 1200cc/FR. 8. ESRD: Schedule HD at the end of the day on MWF to help with tolerance of therapy.  --on Renvela  and Hectorol for metabolic bone disease--renal adjusting.   -Cr increased to 11.13 on 2/19- appreciate management by nephrology 9. T2DM with nephropathy: Hgb A1c- 6.4 on last check. Was on Lantus 123XX123 units with Trulicity 1.5 mg/wk.  --Monitor BS ac/hs and use SSI for elevated BS --Continue to titrate Lantus to home dose as indicated   2/27- BGs 95-159- doing great- con't regimen 10 HTN: Monitor BP tid. Has been labile --Continue Metoprolol and amlodipine.  2/25- BP 120s/60s-70s- well controlled- con't meds per Renal  3/1- BP wel controlled- con't regimen for now, per renal 11. PVD: Continue Plavix, ASA and Lipitor 12. Morbid obesity: BMI 39.01. --educate on diet as well as importance of weight loss to help promote health and mobility.   3/1- BMI up to 40.38- likely weight gain as compared to fluid- esp AFTER BKA, will con't counseling.  13. Anemia of chronic disease: Being managed with weekly Aranesp.  14. Thrombocytosis: Continue to  trend--improved from peak of 774-->600  2/26- Plts 612k- stable- con't to monitor  15. Leukocytosis: discussed with patient that WBC is elevated. She is asymptomatic and without fever, repeat with next dialysis labs to trend  2/21- WBC up to 15k-will check U/A and Cx since pt is oliguric, not anuric- most likely cause vs her dry gangrene of R foot/toes- will reassess in Am and recheck CBC/renal fxn panel in AM- even though it's not at HD, need the results.   2/22- WBC down to 13.3k - U/A and Cx is pending. D/w nursing  2/24- WBC went up to 15.6 yesterday- back down to 13k today/this AM- will do additional ID w/u including blood Cx's.  2/25- Blood Cx's pending; Urine Cx no growth- WBC 14.9 today- asymptomatic- pt says she remembers that she usually has elevated WBC for an unknown reason. If blood C'xs negative, will ask her PCP to address after d/c.    2/27- no growth x 2 days so far- will con't to monitor  2/28: WBC stable, continue to monitor.  3/1- WBC 14k- stable, but elevated- will ask PCP to address after d/c- could be from auto-amputation- but has had chronically.   16. Hyperkalemia/Hyponatremia  2/21- will d/w renal- NA down to 126 and K+ up to 5.6.   2/22- K+ down to 4.0 and Na up to 130- con't per renal  2/24- NA 129- up from 124 and K+ is 4.3- per renal.   2/25- Na 126 today- per renal- thank you for assistance.   3/1- Na 125- per renal with HD.    LOS: 11 days A FACE TO FACE EVALUATION WAS PERFORMED  Christy Zhang 06/27/2020, 10:13 AM

## 2020-06-27 NOTE — Plan of Care (Signed)
  Problem: Consults Goal: RH LIMB LOSS PATIENT EDUCATION Description: Description: See Patient Education module for eduction specifics. Outcome: Progressing Goal: Diabetes Guidelines if Diabetic/Glucose > 140 Description: If diabetic or lab glucose is > 140 mg/dl - Initiate Diabetes/Hyperglycemia Guidelines & Document Interventions  Outcome: Progressing   Problem: RH BOWEL ELIMINATION Goal: RH STG MANAGE BOWEL WITH ASSISTANCE Description: STG Manage Bowel with mod I Assistance. Outcome: Progressing   Problem: RH SKIN INTEGRITY Goal: RH STG ABLE TO PERFORM INCISION/WOUND CARE W/ASSISTANCE Description: STG Able To Perform Incision/Wound Care With mod I Assistance. Outcome: Progressing   Problem: RH PAIN MANAGEMENT Goal: RH STG PAIN MANAGED AT OR BELOW PT'S PAIN GOAL Description: Pain level less than 4 on scale of 0-10 Outcome: Progressing   Problem: RH KNOWLEDGE DEFICIT LIMB LOSS Goal: RH STG INCREASE KNOWLEDGE OF SELF CARE AFTER LIMB LOSS Description: Pt will be able to demonstrate understanding of medication regimen and dietary precautions to manage blood glucose and ESRD and prevent complications with mod I assist using handouts and booklets provided.  Outcome: Progressing

## 2020-06-28 LAB — RENAL FUNCTION PANEL
Albumin: 2.5 g/dL — ABNORMAL LOW (ref 3.5–5.0)
Anion gap: 12 (ref 5–15)
BUN: 57 mg/dL — ABNORMAL HIGH (ref 8–23)
CO2: 23 mmol/L (ref 22–32)
Calcium: 8.6 mg/dL — ABNORMAL LOW (ref 8.9–10.3)
Chloride: 92 mmol/L — ABNORMAL LOW (ref 98–111)
Creatinine, Ser: 8.68 mg/dL — ABNORMAL HIGH (ref 0.44–1.00)
GFR, Estimated: 5 mL/min — ABNORMAL LOW (ref >60)
Glucose, Bld: 104 mg/dL — ABNORMAL HIGH (ref 70–99)
Phosphorus: 3.9 mg/dL (ref 2.5–4.6)
Potassium: 5 mmol/L (ref 3.5–5.1)
Sodium: 127 mmol/L — ABNORMAL LOW (ref 135–145)

## 2020-06-28 LAB — CBC
HCT: 24.3 % — ABNORMAL LOW (ref 36.0–46.0)
Hemoglobin: 7.5 g/dL — ABNORMAL LOW (ref 12.0–15.0)
MCH: 26 pg (ref 26.0–34.0)
MCHC: 30.9 g/dL (ref 30.0–36.0)
MCV: 84.4 fL (ref 80.0–100.0)
Platelets: 575 10*3/uL — ABNORMAL HIGH (ref 150–400)
RBC: 2.88 MIL/uL — ABNORMAL LOW (ref 3.87–5.11)
RDW: 17.8 % — ABNORMAL HIGH (ref 11.5–15.5)
WBC: 14.1 10*3/uL — ABNORMAL HIGH (ref 4.0–10.5)
nRBC: 0 % (ref 0.0–0.2)

## 2020-06-28 LAB — GLUCOSE, CAPILLARY
Glucose-Capillary: 105 mg/dL — ABNORMAL HIGH (ref 70–99)
Glucose-Capillary: 100 mg/dL — ABNORMAL HIGH (ref 70–99)

## 2020-06-28 MED ORDER — DOXERCALCIFEROL 4 MCG/2ML IV SOLN
INTRAVENOUS | Status: AC
  Filled 2020-06-28: qty 2

## 2020-06-28 NOTE — Progress Notes (Signed)
Physical Therapy Session Note  Patient Details  Name: Christy Zhang MRN: [id number] Date of Birth: 1955-12-02  Today's Date: 06/28/2020 PT Individual Time: 1125-1205 PT Individual Time Calculation (min): 40 min   Short Term Goals:  Week 2:  PT Short Term Goal 1 (Week 2): STG=LTG due to ELOS  Skilled Therapeutic Interventions/Progress Updates:   Pt received sitting in WC and agreeable to PT, upon completion of OT treatment.   Pt transported entrance of [place]  Seated BLE therex:  LAQ x 20, hip flexion x 20, hip abduction with manual resistance x 12, R ankle DF/PF x 20. Cues for hold at end range and decreased speed.   Sit stand from [facility] with min-mod assist x2 Standing balance to perform static stance with alternating 1 arm support 4x5 sec each. Min A fading to mod assist with fatigue due to increasing posterior LOB.   WC mobility over unlevel cement sidewalk x 100, 80, 50 with min assist to improve control on downhill grade and prevent loss of progress on uphill grade.    Pt returned to room and performed SB transfer to bed with set up from PT and CGA from PT for safety. Sit>supine completed with supervision assist with cues for LE placement, and left supine in bed with call bell in reach and all needs met.        Therapy Documentation Precautions:  Precautions Precautions: Fall Precaution Comments: falls, R 2nd toe wound Required Braces or Orthoses: Knee Immobilizer - Left Knee Immobilizer - Left: Other (comment) Restrictions Weight Bearing Restrictions: Yes LLE Weight Bearing: Non weight bearing Pain: DENIES   Therapy/Group: Individual Therapy  Lorie Phenix 06/28/2020, 12:05 PM

## 2020-06-28 NOTE — Progress Notes (Signed)
Physical Therapy Session Note  Patient Details  Name: Christy Zhang MRN: [id number] Date of Birth: [birth year]  Today's Date: 06/28/2020 PT Individual Time: 0901-1010 PT Individual Time Calculation (min): 69 min   Short Term Goals: Week 1:  PT Short Term Goal 1 (Week 1): STG=LTG due to ELOS PT Short Term Goal 1 - Progress (Week 1): Not met Week 2:  PT Short Term Goal 1 (Week 2): STG=LTG due to ELOS      Skilled Therapeutic Interventions/Progress Updates:    pt received in bed and agreeable to therapy. Pt denied pain at start and end of session. Pt directed in supine>sit CGA with VC for safety awareness with sitting close to edge of bed to decrease risk of sliding off of bed. Pt then directed in slide board transfer to [facility], max A with unable to achieve sliding to pt's Lt side from bed. Pt then directed in slide board transfer to pt's Rt mod A with VC for Head/hips relationship for optimal scooting and VC for hand placement to prevent pt from pushing with hand on WC wheel to decrease risk of WC moving during transfer. Pt then directed in [place] mobility 150' at [facility] with poor cadence but improved distance since eval. Pt then directed in slide board transfer to mat table at pt's Rt min A with pt limited 2/2 body habitus and difficulty with anterior lean. Once sitting EOB pt reported daughter called her back and reported WC at home does not have moveable armrests and pt will require this for slide board transfer. Pt then reported she does not know if HD will assist her in transfers to/from dialysis chair and PT reported she will discuss with case management for options. Pt then directed in dynamic sitting balance with reaching in various directions  Without LOB, CGA 2x10. Pt then directed in lateral scooting at EOB 2x4 R and 2x4 Lt pt fatigued quickly and required rest breaks intermittently and benefited from [facility] for Head/hips relationship to improve overall I with slide board transfer. Pt directed in slide  board transfer back to [facility] at min A with VC for Head/hips relationship and hand placement. Pt then directed in [facility] mobility 150' +70' +70' CGA with VC for BUE propulsion technique. Pt then directed in 3x30s arm bike, x25 overhead reaches,  Lt leg lifts, Rt marching, LAQ and B hip abbuction and adduction. Pt left in WC, alarm set, All needs in reach and in good condition. Call light in hand.    Therapy Documentation Precautions:  Precautions Precautions: Fall Precaution Comments: falls, R 2nd toe wound Required Braces or Orthoses: Knee Immobilizer - Left Knee Immobilizer - Left: Other (comment) Restrictions Weight Bearing Restrictions: Yes LLE Weight Bearing: Non weight bearing General:   Vital Signs: Therapy Vitals Temp: 98 F (36.7 C) Temp Source: Oral Pulse Rate: 72 Resp: 18 BP: (!) 141/25 Patient Position (if appropriate): Lying Oxygen Therapy SpO2: 95 % O2 Device: Room Air Pain:   Mobility:   Locomotion :    Trunk/Postural Assessment :    Balance:   Exercises:   Other Treatments:      Therapy/Group: Individual Therapy  [name] 06/28/2020, 2:14 PM

## 2020-06-28 NOTE — Progress Notes (Signed)
PROGRESS NOTE   Subjective/Complaints:  Likes transfer board better.   No issues otherwise ROS:   Pt denies SOB, abd pain, CP, N/V/C/D, and vision changes   Objective:   No results found. Recent Labs    06/26/20 1255  WBC 14.8*  HGB 7.4*  HCT 24.4*  PLT 586*   Recent Labs    06/26/20 1255  NA 125*  K 5.4*  CL 94*  CO2 21*  GLUCOSE 153*  BUN 73*  CREATININE 10.63*  CALCIUM 8.7*    Intake/Output Summary (Last 24 hours) at 06/28/2020 0748 Last data filed at 06/27/2020 1850 Gross per 24 hour  Intake 460 ml  Output --  Net 460 ml        Physical Exam: Vital Signs Blood pressure (!) 150/60, pulse 73, temperature 97.9 F (36.6 C), temperature source Oral, resp. rate 20, height '5\' 3"'$  (1.6 m), weight 102.4 kg, SpO2 95 %.    General: awake, alert, appropriate, NAD, sitting up in bed HENT: conjugate gaze; oropharynx moist CV: regular rate; no JVD Pulmonary: CTA B/L; no W/R/R- good air movement GI: soft, NT, ND, (+)BS Psychiatric: appropriate- cordial Neurological: Ox3 Skin: intact except LEs- L BKA and R toes- gangrene- R 3rd toe about to autoamputate any minute- Pics as below Musculoskeletal: TTP over L shoulder anterior top and posterior Cervical back: Normal range of motion.  Comments: L BKA has retention sutures and staples- a little sanguinous drainage on dressing, nothing active- looks good- no dog ears, swelling improved- wearing ACE wraps and KI- looks the same as Tuesday except slightly open on 1 side- steristrips placed- draining slightly from Lateral aspect of L BKA- on dressing, but not actively draining when dressing off.  Skin: General: Skin is warm.  Comments: Chronic changes in RLE, 3rd digit wrapped in kerlex; 2nd and 4th toe have dry gangrene Skin very dry in foot/leg. Neuro: Ox3  Cranial nerve exam unremarkable. UE motor 4+ to 5/5. RLE: 3+/5 HF, 4/5 KE and 3-4/5 ADF/PF. LLE:  able to lift leg off bed about 6". Decreased LT RLE distally.           Assessment/Plan: 1. Functional deficits which require 3+ hours per day of interdisciplinary therapy in a comprehensive inpatient rehab setting.  Physiatrist is providing close team supervision and 24 hour management of active medical problems listed below.  Physiatrist and rehab team continue to assess barriers to discharge/monitor patient progress toward functional and medical goals  Care Tool:  Bathing    Body parts bathed by patient: Face,Right arm,Chest,Left arm,Abdomen,Right upper leg,Front perineal area,Left upper leg   Body parts bathed by helper: Buttocks,Right lower leg Body parts n/a: Left lower leg   Bathing assist Assist Level: Moderate Assistance - Patient 50 - 74%     Upper Body Dressing/Undressing Upper body dressing   What is the patient wearing?: Pull over shirt    Upper body assist Assist Level: Supervision/Verbal cueing    Lower Body Dressing/Undressing Lower body dressing      What is the patient wearing?: Skirt,Underwear/pull up     Lower body assist Assist for lower body dressing: Moderate Assistance - Patient 50 - 74%  Toileting Toileting    Toileting assist Assist for toileting: Moderate Assistance - Patient 50 - 74%     Transfers Chair/bed transfer  Transfers assist     Chair/bed transfer assist level: Minimal Assistance - Patient > 75% (slide board) Chair/bed transfer assistive device: Sliding board   Locomotion Ambulation   Ambulation assist   Ambulation activity did not occur: Safety/medical concerns  Assist level: Contact Guard/Touching assist Assistive device: Parallel bars Max distance: 8'   Walk 10 feet activity   Assist  Walk 10 feet activity did not occur: Safety/medical concerns        Walk 50 feet activity   Assist Walk 50 feet with 2 turns activity did not occur: Safety/medical concerns         Walk 150 feet  activity   Assist Walk 150 feet activity did not occur: Safety/medical concerns         Walk 10 feet on uneven surface  activity   Assist Walk 10 feet on uneven surfaces activity did not occur: Safety/medical concerns         Wheelchair     Assist Will patient use wheelchair at discharge?: Yes Type of Wheelchair: Manual    Wheelchair assist level: Supervision/Verbal cueing Max wheelchair distance: 100    Wheelchair 50 feet with 2 turns activity    Assist        Assist Level: Supervision/Verbal cueing   Wheelchair 150 feet activity     Assist      Assist Level: Supervision/Verbal cueing   Blood pressure (!) 150/60, pulse 73, temperature 97.9 F (36.6 C), temperature source Oral, resp. rate 20, height '5\' 3"'$  (1.6 m), weight 102.4 kg, SpO2 95 %.  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 -patient may showerif residual limb is water-proofed -ELOS/Goals: 7-10 days, Mod I at w/c level  -Continue CIR 2. Antithrombotics: -DVT/anticoagulation:Pharmaceutical:Continue Heparin -antiplatelet therapy: ASA/Plavix 3. Pain Management:ContinueOxycodone prn--received 2 doses 2/20  2/21- having more phantom pain than anything- on no phantom pain meds- will start Gabapentin 300 mg QHS for now- since on HD- max dose we can usually use.   2/22- pain better today- con't regimen  2/24- pt reports some early AM hallucinations- will change to Norco for pain- if doesn't improve, will need to decrease Gabapentin dose  2/25- doesn't report any hallucinations and Norco working well for her- for pain control- con't regimen  2/26- pain doing well except L shoulder- will start Voltaren gel QID for L shoulder  3/1- Pain 5/10 last night, but meds worked- con't regimen  3/2- pain doing well- con't regimen 4. Mood:LCSW to follow for evaluation and  support. -antipsychotic agents: N/A 5. Neuropsych: This patientiscapable of making decisions on herown behalf. 6. Skin/Wound Care:Protein supplement for hypoalbuminemia  --monitor wound for healing.  -apply dry dressing and ACE to residual limb -apply small amount of gauze to protect gangrenous second toe which is about to auto-amputate   2/22- L BKA looking better- healing; stable in RLE/toes- doesn't appear to be source of Leukocytosis  2/28: ordered new knee immobilizer as current not fitting well.  3/1- KI was on wrong per nursing- didn't get new one- put old one on correctly.  7. Fluids/Electrolytes/Nutrition:Strict I/O. Labs with HD. 1200cc/FR. 8. ESRD: Schedule HD at the end of the day on MWF to help with tolerance of therapy.  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   -Cr increased to  11.13 on 2/19- appreciate management by nephrology 9. T2DM with nephropathy: Hgb A1c- 6.4 on last check. Was on Lantus 123XX123 units with Trulicity 1.5 mg/wk.  --Monitor BS ac/hs and use SSI for elevated BS --Continue to titrate Lantus to home dose as indicated   2/27- BGs 95-159- doing great- con't regimen  3/2- BGs great control- con't regimen 10 HTN: Monitor BP tid. Has been labile --Continue Metoprolol and amlodipine.  2/25- BP 120s/60s-70s- well controlled- con't meds per Renal  3/1- BP wel controlled- con't regimen for now, per renal 11. PVD: Continue Plavix, ASA and Lipitor 12. Morbid obesity: BMI 39.01. --educate on diet as well as importance of weight loss to help promote health and mobility.   3/1- BMI up to 40.38- likely weight gain as compared to fluid- esp AFTER BKA, will con't counseling.  13. Anemia of chronic disease: Being managed with weekly Aranesp.  14. Thrombocytosis: Continue to trend--improved from peak of 774-->600  2/26- Plts 612k-  stable- con't to monitor  15. Leukocytosis: discussed with patient that WBC is elevated. She is asymptomatic and without fever, repeat with next dialysis labs to trend  2/21- WBC up to 15k-will check U/A and Cx since pt is oliguric, not anuric- most likely cause vs her dry gangrene of R foot/toes- will reassess in Am and recheck CBC/renal fxn panel in AM- even though it's not at HD, need the results.   2/22- WBC down to 13.3k - U/A and Cx is pending. D/w nursing  2/24- WBC went up to 15.6 yesterday- back down to 13k today/this AM- will do additional ID w/u including blood Cx's.  2/25- Blood Cx's pending; Urine Cx no growth- WBC 14.9 today- asymptomatic- pt says she remembers that she usually has elevated WBC for an unknown reason. If blood C'xs negative, will ask her PCP to address after d/c.    2/27- no growth x 2 days so far- will con't to monitor  2/28: WBC stable, continue to monitor.  3/1- WBC 14k- stable, but elevated- will ask PCP to address after d/c- could be from auto-amputation- but has had chronically.   16. Hyperkalemia/Hyponatremia  2/21- will d/w renal- NA down to 126 and K+ up to 5.6.   2/22- K+ down to 4.0 and Na up to 130- con't per renal  2/24- NA 129- up from 124 and K+ is 4.3- per renal.   2/25- Na 126 today- per renal- thank you for assistance.   3/1- Na 125- per renal with HD.    LOS: 12 days A FACE TO FACE EVALUATION WAS PERFORMED  Bernard Donahoo 06/28/2020, 7:48 AM

## 2020-06-28 NOTE — Progress Notes (Signed)
Occupational Therapy Session Note  Patient Details  Name: JACQUE CRASK MRN: [id number] Date of Birth: [birth year]  Today's Date: 06/28/2020 OT Individual Time: [id number] OT Individual Time Calculation (min): 44 min    Short Term Goals: Week 2:  OT Short Term Goal 1 (Week 2): Pt will complete LB dressing tasks with Min A using adaptive strategies + AE as needed OT Short Term Goal 2 (Week 2): Pt will complete toilet or BSC transfer with close supervision using LRAD  Skilled Therapeutic Interventions/Progress Updates:    Pt recevied sitting in w/c with no c/o pain, agreeable to OT session. She propelled w/c into the bathroom with min A to navigate ramped threshold. She completed stand pivot transfer to the [facility] over toilet with use of grab bar and min A overall. She voided BM and was able to complete peri hygiene with CGA, leaning forward on BSC. She required max A to don depends, requiring assist to thread over BLE and to pull up in standing. She was able to release her RUE from the grab bar to assist with clothing management, but not her L. Pt completed UB bathing and dressing at the sink with set up assist. Pt donned skirt with mod A. Pt propelled w/c to the therapy gym. She was passed off to PT in the therapy gym.   Therapy Documentation Precautions:  Precautions Precautions: Fall Precaution Comments: falls, R 2nd toe wound Required Braces or Orthoses: Knee Immobilizer - Left Knee Immobilizer - Left: Other (comment) Restrictions Weight Bearing Restrictions: Yes LLE Weight Bearing: Non weight bearing  Therapy/Group: Individual Therapy  Curtis Sites 06/28/2020, 7:30 AM

## 2020-06-28 NOTE — Progress Notes (Addendum)
Subjective: Seen in room, no complaints, for dialysis today  Objective Vital signs in last 24 hours: Vitals:   06/27/20 0726 06/27/20 1457 06/27/20 1955 06/28/20 0441  BP: (!) 144/58 132/80 (!) 133/58 (!) 150/60  Pulse: 73 71 66 73  Resp: '19 18 18 20  '$ Temp: 97.6 F (36.4 C) 97.8 F (36.6 C) 97.9 F (36.6 C) 97.9 F (36.6 C)  TempSrc: Axillary Oral  Oral  SpO2: 97% 95% 98% 95%  Weight:    102.4 kg  Height:       Weight change: -3 kg  Physical Exam: General: Conically appearing female, NAD Heart: RRR, no MRG Lungs: CTA, room air, nonlabored breathing Abdomen: Obese bowel sounds normoactive, NTND, mild abdominal  edema Extremities: No pedal edema, left BKA brace/dressing Dialysis Access: RU AVG positive bruit  Dialysis Orders: [facility] MWF 4hrs 2K 2.5Ca EDW 101kg RU AVG Heparin 1000 load and 1000 units qhr EPO 14000 units qHD Venofer '100mg'$  qHD Hectorol 1.33mg qHD  Problem/Plan: 1. Gangrene L foot-> L BKA 2/10 by VVS:in CIR for therapy.  2. 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 Per primary. 3. Leukocytosis -staying elevated slightly 13-15K - CBC pending today predialysis, negative BCxand UCx 4. Thrombocytosis - improving.   5. ESRD: Continue HD per MWF schedule via AVG - next today 3/2, labs pending predialysis 6. HTN/ volume: BP mostly in goal, this a.m. 150/60, but with hyponatremia which favors volume excess -  UF goal for HD  3 to 4 L today  , continue homeamlodipineand metoprolol- may need to wean amlodipine. 7. DM2- peradmitting team. 8. Anemiaof ESRD: LastHgb7.4,(2/28) Aranesp  1075m,  inc to 200 to Friday due  3/4  -.Tsat 24% -orderedshort Fe course. 9. 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  10. Nutrition - Renal diet with fluid restrictions. Protein supplements, Vitamins.  ALB 2.4 (2/28)  DaErnest HaberPA-C Ca[phone]/05/2020,8:38 AM  LOS: 12 days    Patient  seen and examined, agree with above note with above modifications. No new issues today-  Stable from a dialysis standpoint-  Cont with routine HD and appropriate titrations of dialysis related medications  KeCorliss ParishMD 06/28/2020     Labs: Basic Metabolic Panel: Recent Labs  Lab 06/22/20 0506 06/23/20 1226 06/26/20 1255  NA 129* 126* 125*  K 4.3 4.9 5.4*  CL 93* 90* 94*  CO2 26 23 21*  GLUCOSE 106* 124* 153*  BUN 38* 60* 73*  CREATININE 6.82* 9.28* 10.63*  CALCIUM 8.3* 8.6* 8.7*  PHOS 4.0 5.1* 4.5   Liver Function Tests: Recent Labs  Lab 06/22/20 0506 06/23/20 1226 06/26/20 1255  ALBUMIN 2.4* 2.5* 2.4*   No results for input(s): LIPASE, AMYLASE in the last 168 hours. No results for input(s): AMMONIA in the last 168 hours. CBC: Recent Labs  Lab 06/21/20 1426 06/22/20 0506 06/23/20 1239 06/26/20 1255  WBC 15.6* 13.2* 14.9* 14.8*  NEUTROABS  --  9.4*  --   --   HGB 8.0* 7.6* 7.3* 7.4*  HCT 25.1* 24.1* 24.1* 24.4*  MCV 81.8 82.8 84.0 85.6  PLT 738* 648* 612* 586*   Cardiac Enzymes: No results for input(s): CKTOTAL, CKMB, CKMBINDEX, TROPONINI in the last 168 hours. CBG: Recent Labs  Lab 06/26/20 0503 06/26/20 1925 06/27/20 0504 06/27/20 1652 06/28/20 0529  GLUCAP 127* 113* 82 153* 105*    Studies/Results: No results found. Medications: . ferric gluconate (FERRLECIT/NULECIT) IV 125 mg (06/26/20 1628)   . (feeding supplement)  PROSource Plus  30 mL Oral BID BM  . amLODipine  10 mg Oral QHS  . aspirin EC  81 mg Oral QHS  . atorvastatin  40 mg Oral QHS  . Chlorhexidine Gluconate Cloth  6 each Topical Q0600  . clopidogrel  75 mg Oral Daily  . [START ON 06/30/2020] darbepoetin (ARANESP) injection - DIALYSIS  200 mcg Intravenous Q Fri-HD  . diclofenac Sodium  2 g Topical QID  . doxercalciferol  1.5 mcg Intravenous Q M,W,F-HD  . Dulaglutide  1.5 mg Subcutaneous Weekly  . gabapentin  300 mg Oral QHS  . heparin  5,000 Units Subcutaneous Q8H  .  insulin glargine  20 Units Subcutaneous Daily  . metoprolol succinate  50 mg Oral Daily  . multivitamin  1 tablet Oral QHS  . polyethylene glycol  17 g Oral Daily  . Ensure Max Protein  11 oz Oral Daily  . senna-docusate  1 tablet Oral BID  . sevelamer carbonate  2,400 mg Oral TID WC

## 2020-06-29 LAB — GLUCOSE, CAPILLARY
Glucose-Capillary: 90 mg/dL (ref 70–99)
Glucose-Capillary: 142 mg/dL — ABNORMAL HIGH (ref 70–99)

## 2020-06-29 MED ORDER — SODIUM CHLORIDE 0.9 % IV SOLN
125.0000 mg | INTRAVENOUS | Status: AC
  Administered 2020-06-30: 125 mg via INTRAVENOUS
  Filled 2020-06-29: qty 10

## 2020-06-29 MED ORDER — SODIUM CHLORIDE 0.9 % IV SOLN: 125.0000 mg | INTRAVENOUS | Status: DC

## 2020-06-29 MED ORDER — CHLORHEXIDINE GLUCONATE CLOTH 2 % EX PADS
6.0000 | MEDICATED_PAD | Freq: Every day | CUTANEOUS | Status: DC
  Administered 2020-06-30 – 2020-07-04 (×2): 6 via TOPICAL

## 2020-06-29 NOTE — Progress Notes (Signed)
Occupational Therapy Session Note  Patient Details  Name: Christy Zhang MRN: [id number] Date of Birth: 11/29/1955  Today's Date: 06/29/2020 OT Individual Time: 6063-0160 OT Individual Time Calculation (min): 40 min    Short Term Goals: Week 1:  OT Short Term Goal 1 (Week 1): Pt will complete BSC transfer with CGA + AE PRN OT Short Term Goal 1 - Progress (Week 1): Met OT Short Term Goal 2 (Week 1): Pt will complete standing bimanual ADL  for >2 min with CGA + AE PRN. OT Short Term Goal 2 - Progress (Week 1): Progressing toward goal OT Short Term Goal 3 (Week 1): Pt will don shirt with close S. OT Short Term Goal 3 - Progress (Week 1): Met OT Short Term Goal 5 (Week 1): Pt will complete toileting tasks wtih Min A using adaptive strategies as needed Week 2:  OT Short Term Goal 1 (Week 2): Pt will complete LB dressing tasks with Min A using adaptive strategies + AE as needed OT Short Term Goal 2 (Week 2): Pt will complete toilet or BSC transfer with close supervision using LRAD  Skilled Therapeutic Interventions/Progress Updates:    Pt greeted at time of session sitting up in wheelchair agreeable to OT session, no pain. Needing to use bathroom, self propel to bathroom andMin A for placement close to toilet. Scoot transfer to [facility] over toilet with CGA after removal of armrest and drop arm down to preserve RLE integrity. Pt ed on what DME she will have for home, likely drop arm BSC but not bariatric d/t size but she is aware she can purchase 2nd hand or [employer] if wanted. Hygiene CGA today with anterior weight shift and lateral leans for buttocks, donned new brief Max A to thread and pt partial stand for therapist to don over hips, tried lateral leans but unable this date. Squat pivot back to wheelchair CGA, again discussing set up that she will need BSC in room at home since wheelchair doesn't fit in bathroom and have daughter assist with clothing. Self propel > sink and hand hygiene/oral hygiene  set up. Removed KI and ace wrap to inspect residual limb, noted to have decreased swelling and discussed prosthetic future. Rewrapped residual limb and KI applied. Up in wheelchair with alarm on call bell inr each.   Therapy Documentation Precautions:  Precautions Precautions: Fall Precaution Comments: falls, R 2nd toe wound Required Braces or Orthoses: Knee Immobilizer - Left Knee Immobilizer - Left: Other (comment) Restrictions Weight Bearing Restrictions: Yes LLE Weight Bearing: Non weight bearing    Therapy/Group: Individual Therapy  Viona Gilmore 06/29/2020, 7:18 AM

## 2020-06-29 NOTE — Progress Notes (Signed)
Subjective: HD yest- removed 3500 but tolerated well   Objective Vital signs in last 24 hours: Vitals:   06/28/20 1910 06/28/20 1954 06/29/20 0500 06/29/20 0502  BP: (!) 141/65 (!) 165/57  (!) 141/57  Pulse: 69 72  70  Resp: '20 20  18  '$ Temp: 98.3 F (36.8 C) 98.4 F (36.9 C)  98.6 F (37 C)  TempSrc: Oral Oral  Oral  SpO2:  100%  94%  Weight:   103.3 kg   Height:       Weight change: 0.9 kg  Physical Exam: General: Conically appearing female, NAD Heart: RRR, no MRG Lungs: CTA, room air, nonlabored breathing Abdomen: Obese bowel sounds normoactive, NTND, mild abdominal  edema Extremities: No pedal edema, left BKA brace/dressing Dialysis Access: RU AVG positive bruit  Dialysis Orders: [facility] MWF 4hrs 2K 2.5Ca EDW 101kg RU AVG Heparin 1000 load and 1000 units qhr EPO 14000 units qHD Venofer '100mg'$  qHD Hectorol 1.18mg qHD  Problem/Plan: 1. Gangrene L foot-> L BKA 2/10 by VVS:in CIR for therapy.  2. 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 Per primary. 3. Leukocytosis -staying elevated slightly 13-15K - CBC pending today predialysis, negative BCxand UCx 4. Thrombocytosis - improving.   5. ESRD: Continue HD per MWF schedule via AVG - next  3/4 6. HTN/ volume: BP mostly in goal, this a.m. 150/60, but with hyponatremia which favors volume excess -  UF goal high yesterday but tolerated  , continue homeamlodipineand metoprolol- but continue to challenge 7. DM2- peradmitting team. 8. Anemiaof ESRD: LastHgb7.4,(2/28) Aranesp  1065m,  inc to 200 q Friday due  3/4  -.Tsat 24% -orderediron 9. 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  10. Nutrition - Renal diet with fluid restrictions. Protein supplements, Vitamins.  ALB 2.4 (2/28)  KeCorliss ParishMD 06/29/2020     Labs: Basic Metabolic Panel: Recent Labs  Lab 06/23/20 1226 06/26/20 1255 06/28/20 1407  NA 126* 125* 127*  K 4.9 5.4* 5.0  CL 90* 94* 92*  CO2  23 21* 23  GLUCOSE 124* 153* 104*  BUN 60* 73* 57*  CREATININE 9.28* 10.63* 8.68*  CALCIUM 8.6* 8.7* 8.6*  PHOS 5.1* 4.5 3.9   Liver Function Tests: Recent Labs  Lab 06/23/20 1226 06/26/20 1255 06/28/20 1407  ALBUMIN 2.5* 2.4* 2.5*   No results for input(s): LIPASE, AMYLASE in the last 168 hours. No results for input(s): AMMONIA in the last 168 hours. CBC: Recent Labs  Lab 06/23/20 1239 06/26/20 1255 06/28/20 1407  WBC 14.9* 14.8* 14.1*  HGB 7.3* 7.4* 7.5*  HCT 24.1* 24.4* 24.3*  MCV 84.0 85.6 84.4  PLT 612* 586* 575*   Cardiac Enzymes: No results for input(s): CKTOTAL, CKMB, CKMBINDEX, TROPONINI in the last 168 hours. CBG: Recent Labs  Lab 06/27/20 0504 06/27/20 1652 06/28/20 0529 06/28/20 1958 06/29/20 0503  GLUCAP 82 153* 105* 100* 90    Studies/Results: No results found. Medications: . [START ON 06/30/2020] ferric gluconate (FERRLECIT/NULECIT) IV     . (feeding supplement) PROSource Plus  30 mL Oral BID BM  . amLODipine  10 mg Oral QHS  . aspirin EC  81 mg Oral QHS  . atorvastatin  40 mg Oral QHS  . Chlorhexidine Gluconate Cloth  6 each Topical Q0600  . clopidogrel  75 mg Oral Daily  . [START ON 06/30/2020] darbepoetin (ARANESP) injection - DIALYSIS  200 mcg Intravenous Q Fri-HD  . diclofenac Sodium  2 g Topical QID  . doxercalciferol  1.5 mcg  Intravenous Q M,W,F-HD  . Dulaglutide  1.5 mg Subcutaneous Weekly  . gabapentin  300 mg Oral QHS  . heparin  5,000 Units Subcutaneous Q8H  . insulin glargine  20 Units Subcutaneous Daily  . metoprolol succinate  50 mg Oral Daily  . multivitamin  1 tablet Oral QHS  . polyethylene glycol  17 g Oral Daily  . Ensure Max Protein  11 oz Oral Daily  . senna-docusate  1 tablet Oral BID  . sevelamer carbonate  2,400 mg Oral TID WC

## 2020-06-29 NOTE — Progress Notes (Signed)
Physical Therapy Session Note  Patient Details  Name: Christy Zhang MRN: [id number] Date of Birth: [birth year]  Today's Date: 06/29/2020 PT Individual Time: 1345-1425 PT Individual Time Calculation (min): 40 min   Short Term Goals: Week 2:  PT Short Term Goal 1 (Week 2): STG=LTG due to ELOS  Skilled Therapeutic Interventions/Progress Updates:    Patient received sitting up in wc, agreeable to PT. She denies pain. Patient able to propel herself in wc ~169f prior to fatiguing. TotalA remainder of the way to the therapy gym. She was able to transfer to therapy mat via slide board with MinA, MaxA for slideboard placement. Patient completing supine L SLR 3x10, sidelying L hip extension 3x10, sidelying L hip abduction 3x10. Returning to sitting edge of mat with MinA. Trunk rotation + shoulder press with 1kg ball 3x10. Patient able to transfer back to wc via slideboard with MinA, MMerigoldfor slideboard placement. PT transporting patient back to her room due to fatigue. Patient remaining up in wc, seatbelt alarm on, call light within reach.   Therapy Documentation Precautions:  Precautions Precautions: Fall Precaution Comments: falls, R 2nd toe wound Required Braces or Orthoses: Knee Immobilizer - Left Knee Immobilizer - Left: Other (comment) Restrictions Weight Bearing Restrictions: Yes LLE Weight Bearing: Non weight bearing    Therapy/Group: Individual Therapy  JKaroline Caldwell PT, DPT, CBIS  06/29/2020, 7:46 AM

## 2020-06-29 NOTE — Progress Notes (Signed)
PROGRESS NOTE   Subjective/Complaints:  Pt reports R 3rd toe is "still there" she thinks- notes that doesn't hurt at all.  Slept too much yesterday with HD.  LBM yesterday.    ROS:    Pt denies SOB, abd pain, CP, N/V/C/D, and vision changes   Objective:   No results found. Recent Labs    06/26/20 1255 06/28/20 1407  WBC 14.8* 14.1*  HGB 7.4* 7.5*  HCT 24.4* 24.3*  PLT 586* 575*   Recent Labs    06/26/20 1255 06/28/20 1407  NA 125* 127*  K 5.4* 5.0  CL 94* 92*  CO2 21* 23  GLUCOSE 153* 104*  BUN 73* 57*  CREATININE 10.63* 8.68*  CALCIUM 8.7* 8.6*    Intake/Output Summary (Last 24 hours) at 06/29/2020 0855 Last data filed at 06/29/2020 0750 Gross per 24 hour  Intake 360 ml  Output 3700 ml  Net -3340 ml        Physical Exam: Vital Signs Blood pressure (!) 141/57, pulse 70, temperature 98.6 F (37 C), temperature source Oral, resp. rate 18, height '5\' 3"'$  (1.6 m), weight 103.3 kg, SpO2 94 %.     General: awake, alert, appropriate, NAD- sitting up finished breakfast, watching TV HENT: conjugate gaze; oropharynx moist CV: regular rate and rhythm; no JVD Pulmonary: CTA B/L; no W/R/R- good air movement GI: soft, NT, ND, (+)BS; protuberant Psychiatric: appropriate- sweet Neurological: Ox3 Skin: intact except LEs- L BKA and R toes- gangrene- R 3rd toe about to autoamputate any minute- Pics as below Musculoskeletal: TTP over L shoulder anterior top and posterior Cervical back: Normal range of motion.  Comments: L BKA has retention sutures and staples- a little sanguinous drainage on dressing, nothing active- looks good- no dog ears, swelling improved- wearing ACE wraps and KI- looks the same as Tuesday except slightly open on 1 side- steristrips placed- draining slightly from Lateral aspect of L BKA- on dressing, but not actively draining when dressing off.  Skin: General: Skin is warm.   Comments: Chronic changes in RLE, 3rd digit wrapped in kerlex; 2nd and 4th toe have dry gangrene Skin very dry in foot/leg.  Cranial nerve exam unremarkable. UE motor 4+ to 5/5. RLE: 3+/5 HF, 4/5 KE and 3-4/5 ADF/PF. LLE: able to lift leg off bed about 6". Decreased LT RLE distally.           Assessment/Plan: 1. Functional deficits which require 3+ hours per day of interdisciplinary therapy in a comprehensive inpatient rehab setting.  Physiatrist is providing close team supervision and 24 hour management of active medical problems listed below.  Physiatrist and rehab team continue to assess barriers to discharge/monitor patient progress toward functional and medical goals  Care Tool:  Bathing    Body parts bathed by patient: Face,Right arm,Chest,Left arm,Abdomen,Right upper leg,Front perineal area,Left upper leg   Body parts bathed by helper: Buttocks,Right lower leg Body parts n/a: Left lower leg   Bathing assist Assist Level: Moderate Assistance - Patient 50 - 74%     Upper Body Dressing/Undressing Upper body dressing   What is the patient wearing?: Pull over shirt    Upper body assist Assist Level: Supervision/Verbal cueing  Lower Body Dressing/Undressing Lower body dressing      What is the patient wearing?: Skirt,Underwear/pull up     Lower body assist Assist for lower body dressing: Moderate Assistance - Patient 50 - 74%     Toileting Toileting    Toileting assist Assist for toileting: Moderate Assistance - Patient 50 - 74%     Transfers Chair/bed transfer  Transfers assist     Chair/bed transfer assist level: Minimal Assistance - Patient > 75% (slide board) Chair/bed transfer assistive device: Sliding board   Locomotion Ambulation   Ambulation assist   Ambulation activity did not occur: Safety/medical concerns  Assist level: Contact Guard/Touching assist Assistive device: Parallel bars Max distance: 8'   Walk 10 feet  activity   Assist  Walk 10 feet activity did not occur: Safety/medical concerns        Walk 50 feet activity   Assist Walk 50 feet with 2 turns activity did not occur: Safety/medical concerns         Walk 150 feet activity   Assist Walk 150 feet activity did not occur: Safety/medical concerns         Walk 10 feet on uneven surface  activity   Assist Walk 10 feet on uneven surfaces activity did not occur: Safety/medical concerns         Wheelchair     Assist Will patient use wheelchair at discharge?: Yes Type of Wheelchair: Manual    Wheelchair assist level: Supervision/Verbal cueing Max wheelchair distance: 100    Wheelchair 50 feet with 2 turns activity    Assist        Assist Level: Supervision/Verbal cueing   Wheelchair 150 feet activity     Assist      Assist Level: Supervision/Verbal cueing   Blood pressure (!) 141/57, pulse 70, temperature 98.6 F (37 C), temperature source Oral, resp. rate 18, height '5\' 3"'$  (1.6 m), weight 103.3 kg, SpO2 94 %.  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 -patient may showerif residual limb is water-proofed -ELOS/Goals: 7-10 days, Mod I at w/c level  -Continue CIR 2. Antithrombotics: -DVT/anticoagulation:Pharmaceutical:Continue Heparin -antiplatelet therapy: ASA/Plavix 3. Pain Management:ContinueOxycodone prn--received 2 doses 2/20  2/21- having more phantom pain than anything- on no phantom pain meds- will start Gabapentin 300 mg QHS for now- since on HD- max dose we can usually use.   2/22- pain better today- con't regimen  2/24- pt reports some early AM hallucinations- will change to Norco for pain- if doesn't improve, will need to decrease Gabapentin dose  2/25- doesn't report any hallucinations and Norco working well for her- for pain control- con't  regimen  3/3- pt reports pain controlled- esp RLE- con't regimen prn 4. Mood:LCSW to follow for evaluation and support. -antipsychotic agents: N/A 5. Neuropsych: This patientiscapable of making decisions on herown behalf. 6. Skin/Wound Care:Protein supplement for hypoalbuminemia  --monitor wound for healing.  -apply dry dressing and ACE to residual limb -apply small amount of gauze to protect gangrenous second toe which is about to auto-amputate   2/22- L BKA looking better- healing; stable in RLE/toes- doesn't appear to be source of Leukocytosis  2/28: ordered new knee immobilizer as current not fitting well.  3/1- KI was on wrong per nursing- didn't get new one- put old one on correctly.   3/3- KI fitting well this AM- con't regimen- 7. Fluids/Electrolytes/Nutrition:Strict I/O. Labs with HD. 1200cc/FR. 8. ESRD: Schedule HD at the  end of the day on MWF to help with tolerance of therapy.  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   -Cr increased to 11.13 on 2/19- appreciate management by nephrology 9. T2DM with nephropathy: Hgb A1c- 6.4 on last check. Was on Lantus 123XX123 units with Trulicity 1.5 mg/wk.  --Monitor BS ac/hs and use SSI for elevated BS --Continue to titrate Lantus to home dose as indicated   2/27- BGs 95-159- doing great- con't regimen  3/2- BGs great control- con't regimen 10 HTN: Monitor BP tid. Has been labile --Continue Metoprolol and amlodipine.  2/25- BP 120s/60s-70s- well controlled- con't meds per Renal  3/1- BP wel controlled- con't regimen for now, per renal 11. PVD: Continue Plavix, ASA and Lipitor 12. Morbid obesity: BMI 39.01. --educate on diet as well as importance of weight loss to help promote health and mobility.   3/1- BMI up to 40.38- likely weight gain as compared to fluid- esp AFTER BKA, will con't counseling.   13. Anemia of chronic disease: Being managed with weekly Aranesp.  14. Thrombocytosis: Continue to trend--improved from peak of 774-->600  2/26- Plts 612k- stable- con't to monitor  15. Leukocytosis: discussed with patient that WBC is elevated. She is asymptomatic and without fever, repeat with next dialysis labs to trend  2/21- WBC up to 15k-will check U/A and Cx since pt is oliguric, not anuric- most likely cause vs her dry gangrene of R foot/toes- will reassess in Am and recheck CBC/renal fxn panel in AM- even though it's not at HD, need the results.   2/22- WBC down to 13.3k - U/A and Cx is pending. D/w nursing  2/24- WBC went up to 15.6 yesterday- back down to 13k today/this AM- will do additional ID w/u including blood Cx's.  2/25- Blood Cx's pending; Urine Cx no growth- WBC 14.9 today- asymptomatic- pt says she remembers that she usually has elevated WBC for an unknown reason. If blood C'xs negative, will ask her PCP to address after d/c.    2/27- no growth x 2 days so far- will con't to monitor  2/28: WBC stable, continue to monitor.  3/3- WBC still 14k, but so far, entire ID work up (-)- will have pt f/u outpt- could be from autoamputation    16. Hyperkalemia/Hyponatremia  2/21- will d/w renal- NA down to 126 and K+ up to 5.6.   2/22- K+ down to 4.0 and Na up to 130- con't per renal  2/24- NA 129- up from 124 and K+ is 4.3- per renal.   2/25- Na 126 today- per renal- thank you for assistance.   3/1- Na 125- per renal with HD.    LOS: 13 days A FACE TO FACE EVALUATION WAS PERFORMED  Kaysea Raya 06/29/2020, 8:55 AM

## 2020-06-29 NOTE — Progress Notes (Signed)
Physical Therapy Session Note  Patient Details  Name: Christy Zhang MRN: [id number] Date of Birth: Feb 01, 1956  Today's Date: 06/29/2020 PT Individual Time: 0800-0855 PT Individual Time Calculation (min): 55 min   Short Term Goals: Week 1:  PT Short Term Goal 1 (Week 1): STG=LTG due to ELOS PT Short Term Goal 1 - Progress (Week 1): Not met Week 2:  PT Short Term Goal 1 (Week 2): STG=LTG due to ELOS     Skilled Therapeutic Interventions/Progress Updates:    pt received in bed and agreeable to therapy. Pt denied pain at start and end of session. Pt directed in supine>sit supervision, setup for slide board transfer to [facility] to pt's Rt at [facility] with PT placing board. Pt requesting to outside for therapy session, nursing aware and agreeable. Pt taken off unit total A for time/energy. Pt then directed in WC mobility 50' on level floor indoors, 100' outside on concreted area, +100' +75' +75' min A for incline and CGA for level or decline areas. Pt required resting breaks intermittently 2/2 fatigue. Pt then directed to return inside, min A over threshold to reenter hospital. Pt taken back to unit total A for time and energy. Pt directed in arm bike for 10mins +2 mins +2 mins L 2.5 for improved BLE strengthening for effective WC mobility and increased tolerance to activity. Pt directed in [place] mobility back to room, supervision 100'. Pt requested top remain in [place] at end of session. Pt left in WC, .alarm set, All needs in reach and in good condition. Call light in hand.    Therapy Documentation Precautions:  Precautions Precautions: Fall Precaution Comments: falls, R 2nd toe wound Required Braces or Orthoses: Knee Immobilizer - Left Knee Immobilizer - Left: Other (comment) Restrictions Weight Bearing Restrictions: Yes LLE Weight Bearing: Non weight bearing General:   Vital Signs: Therapy Vitals Temp: 98.6 F (37 C) Temp Source: Oral Pulse Rate: 70 Resp: 18 BP: (!) 141/57 Patient Position (if  appropriate): Lying Oxygen Therapy SpO2: 94 % O2 Device: Room Air Pain: Pain Assessment Pain Scale: 0-10 Pain Score: 3  Pain Intervention(s): Medication (See eMAR) Mobility:   Locomotion :    Trunk/Postural Assessment :    Balance:   Exercises:   Other Treatments:      Therapy/Group: Individual Therapy  [name] 06/29/2020, 8:57 AM

## 2020-06-30 LAB — RENAL FUNCTION PANEL
Albumin: 2.5 g/dL — ABNORMAL LOW (ref 3.5–5.0)
Anion gap: 14 (ref 5–15)
BUN: 61 mg/dL — ABNORMAL HIGH (ref 8–23)
CO2: 21 mmol/L — ABNORMAL LOW (ref 22–32)
Calcium: 9 mg/dL (ref 8.9–10.3)
Chloride: 92 mmol/L — ABNORMAL LOW (ref 98–111)
Creatinine, Ser: 9.6 mg/dL — ABNORMAL HIGH (ref 0.44–1.00)
GFR, Estimated: 4 mL/min — ABNORMAL LOW (ref >60)
Glucose, Bld: 143 mg/dL — ABNORMAL HIGH (ref 70–99)
Phosphorus: 5.5 mg/dL — ABNORMAL HIGH (ref 2.5–4.6)
Potassium: 5.1 mmol/L (ref 3.5–5.1)
Sodium: 127 mmol/L — ABNORMAL LOW (ref 135–145)

## 2020-06-30 LAB — CBC
HCT: 25.5 % — ABNORMAL LOW (ref 36.0–46.0)
Hemoglobin: 7.8 g/dL — ABNORMAL LOW (ref 12.0–15.0)
MCH: 25.5 pg — ABNORMAL LOW (ref 26.0–34.0)
MCHC: 30.6 g/dL (ref 30.0–36.0)
MCV: 83.3 fL (ref 80.0–100.0)
Platelets: 574 10*3/uL — ABNORMAL HIGH (ref 150–400)
RBC: 3.06 MIL/uL — ABNORMAL LOW (ref 3.87–5.11)
RDW: 17.6 % — ABNORMAL HIGH (ref 11.5–15.5)
WBC: 14.7 10*3/uL — ABNORMAL HIGH (ref 4.0–10.5)
nRBC: 0 % (ref 0.0–0.2)

## 2020-06-30 LAB — GLUCOSE, CAPILLARY
Glucose-Capillary: 70 mg/dL (ref 70–99)
Glucose-Capillary: 119 mg/dL — ABNORMAL HIGH (ref 70–99)

## 2020-06-30 MED ORDER — LIDOCAINE-PRILOCAINE 2.5-2.5 % EX CREA: 1.0000 "application " | TOPICAL_CREAM | CUTANEOUS | Status: DC | PRN

## 2020-06-30 MED ORDER — INSULIN GLARGINE 100 UNIT/ML ~~LOC~~ SOLN
18.0000 [IU] | Freq: Every day | SUBCUTANEOUS | Status: DC
  Administered 2020-07-01 – 2020-07-03 (×3): 18 [IU] via SUBCUTANEOUS
  Filled 2020-06-30 (×4): qty 0.18

## 2020-06-30 MED ORDER — PENTAFLUOROPROP-TETRAFLUOROETH EX AERO: 1.0000 "application " | INHALATION_SPRAY | CUTANEOUS | Status: DC | PRN

## 2020-06-30 MED ORDER — SODIUM CHLORIDE 0.9 % IV SOLN: 100.0000 mL | INTRAVENOUS | Status: DC | PRN

## 2020-06-30 MED ORDER — DARBEPOETIN ALFA 200 MCG/0.4ML IJ SOSY
PREFILLED_SYRINGE | INTRAMUSCULAR | Status: AC
  Filled 2020-06-30: qty 0.4

## 2020-06-30 MED ORDER — HEPARIN SODIUM (PORCINE) 1000 UNIT/ML DIALYSIS: 2100.0000 [IU] | INTRAMUSCULAR | Status: DC | PRN

## 2020-06-30 MED ORDER — ALTEPLASE 2 MG IJ SOLR: 2.0000 mg | Freq: Once | INTRAMUSCULAR | Status: DC | PRN

## 2020-06-30 MED ORDER — DOXERCALCIFEROL 4 MCG/2ML IV SOLN
INTRAVENOUS | Status: AC
  Filled 2020-06-30: qty 2

## 2020-06-30 MED ORDER — HYDROCODONE-ACETAMINOPHEN 5-325 MG PO TABS
ORAL_TABLET | ORAL | Status: AC
  Filled 2020-06-30: qty 1

## 2020-06-30 MED ORDER — LIDOCAINE HCL (PF) 1 % IJ SOLN: 5.0000 mL | INTRAMUSCULAR | Status: DC | PRN

## 2020-06-30 MED ORDER — HEPARIN SODIUM (PORCINE) 1000 UNIT/ML DIALYSIS: 1000.0000 [IU] | INTRAMUSCULAR | Status: DC | PRN

## 2020-06-30 NOTE — Progress Notes (Signed)
Physical Therapy Session Note  Patient Details  Name: Christy Zhang MRN: [id number] Date of Birth: 1955-08-19  Today's Date: 06/30/2020 PT Individual Time: 1101-1200 PT Individual Time Calculation (min): 59 min   Short Term Goals: Week 1:  PT Short Term Goal 1 (Week 1): STG=LTG due to ELOS PT Short Term Goal 1 - Progress (Week 1): Not met Week 2:  PT Short Term Goal 1 (Week 2): STG=LTG due to ELOS      Skilled Therapeutic Interventions/Progress Updates:    pt received in [facility] and agreeable to therapy, pt reported increased pain L residual limb overnight and reported she felt that her ace wrap was too tight and pt reports she removed it herself overnight and then nursing replaced it once pt decreased. Pt now reports no pain. Pt reports her daughter is on her way for family training scheduled to start at 11am. Pt's daughter arrived at 51. Pt educated on safety at home with transfers, [facility] mobility, equipment recommendation uses, and assistance for needs. Pt reports her daughter is home AAT with her and agreeable to assist with all needs. Pt also very agreeable to all recommended equipment for home. Pt's daughter present to initiate family education and given handouts for equipment recommendations of bariatric drop arm BSC, sock aid, and reacher. PT went over these and daughter and pt reported understanding. Pt directed in slide board transfer from [facility] to bedside with daughter educated on setup of WC and slide board placement, daughter directed in assistance of pt to transfer to bed and back to [facility] with good success. Daughter and pt report feeling comfortable with this for home. Pt directed in WC to ortho gym, 100' supervision. Pt and pt's daughter directed in car transfer and reported PT recommended transport for medical needs and daughter reports they have access to a taller car for easier Stand pivot transfer to car and daughter reports she will have an additional person for all car mobility with pt  for safety. PT visually demonstrated with slide board and with walker use for safety into and out of car daughter and pt reported comfort and understanding with this for home. Pt had concerns about height of daughter car at home and declined to attempt again from this height. Pt and daughter educated on safety with all car transfers and both agreed. Pt then directed in [facility] mobility back to room, supervision. PT discussed drop arm BSC for home use and demonstrated this as well. Pt left in WC, alarm set, All needs in reach and in good condition. Call light in hand.  And daughter present. Both denied further questions or needs. HD consulted during session and report in clinic they do not assist any pts with transfers to recliner in HD and require person hoyer lift pad for this. Case management made aware.   Therapy Documentation Precautions:  Precautions Precautions: Fall Precaution Comments: falls, R 2nd toe wound Required Braces or Orthoses: Knee Immobilizer - Left Knee Immobilizer - Left: Other (comment) Restrictions Weight Bearing Restrictions: No LLE Weight Bearing: Non weight bearing General:   Vital Signs: Therapy Vitals Temp: 97.8 F (36.6 C) Temp Source: Oral Pulse Rate: 63 Resp: 16 BP: (!) 142/54 Patient Position (if appropriate): Lying Oxygen Therapy SpO2: 96 % O2 Device: Room Air Pain:   Mobility:   Locomotion :    Trunk/Postural Assessment :    Balance:   Exercises:   Other Treatments:      Therapy/Group: Individual Therapy  [name] 06/30/2020, 3:41  PM  

## 2020-06-30 NOTE — Progress Notes (Signed)
Patient ID: Christy Zhang, female   DOB: 05/21/1955, 65 y.o.   MRN: [id number]  According to PT family education went well and will need hoyer pad for HD, since they sue this there to transfer pt to HD chair. Have ordered one via [employer]. Will work on discharge needs for Tuesday 3/8.

## 2020-06-30 NOTE — Progress Notes (Signed)
Physical Therapy Session Note  Patient Details  Name: Christy Zhang MRN: [id number] Date of Birth: [birth year]  Today's Date: 06/30/2020 PT Individual Time: 0945-1030 PT Individual Time Calculation (min): 45 min   Short Term Goals: Week 2:  PT Short Term Goal 1 (Week 2): STG=LTG due to ELOS   Skilled Therapeutic Interventions/Progress Updates:  Patient seated upright in w/c upon PT arrival. Patient alert and agreeable to PT session. Patient denied pain during session but does have audible clicks in R knee with pivoting in WB. KI removed from LLE for session in order for pt to mobilize knee.   Therapeutic Activity: Transfers: Patient performed SB transfer to R and L sides w/c <> bed in order practice both directions for potential need in discharge setting as pt states that she will not be returning to her previous home. She requires mod A for SB placement to each side and is able to perform transfer and removal of SB with supervision. SPVT transfers to R and L using heel/ toe pivot requiring extra time to complete. Provided verbal cues for technique and use of BUE for increased WB in order to offload weight ot RLE during pivoting in order to reduce potential torque to R knee.  Guided pt in heel/ toe pivoting as well as hop-stepping in order to move laterally to R and L along edge of mat table. She requires extra time and seated rest breaks to complete 5 feet distance. Audible clicking in knee heard. Pt states there is no associated pain.   Wheelchair Mobility: Pt is able to propel self 200' x1 and maneuver w/c through doorway to ortho gym and park w/c at side of mat table and set brakes. Requires Max A for R leg rest removal.   Patient seated upright in w/c at end of session with brakes locked, bed alarm set, and all needs within reach to her L on bed or tray table to R. Prepared for family education session in 56mn. KI left off at end of session but placed at bedside and reminded pt to work  with next PT on donning/ doffing with family.    Therapy Documentation Precautions:  Precautions Precautions: Fall Precaution Comments: falls, R 2nd toe wound Required Braces or Orthoses: Knee Immobilizer - Left Knee Immobilizer - Left: Other (comment) Restrictions Weight Bearing Restrictions: No LLE Weight Bearing: Non weight bearing   Therapy/Group: Individual Therapy  JAlger Simons3/07/2020, 12:50 PM

## 2020-06-30 NOTE — Progress Notes (Addendum)
Subjective: No complaints, seen sitting  in wheelchair, physical therapy working with her, for for HD later today- seen on HD - no concerns  Objective Vital signs in last 24 hours: Vitals:   06/29/20 0502 06/29/20 1251 06/29/20 1929 06/30/20 0506  BP: (!) 141/57 (!) 147/26 (!) 148/57 (!) 151/54  Pulse: 70 71 69 71  Resp: '18 17 18 18  '$ Temp: 98.6 F (37 C) 98.2 F (36.8 C) 98.2 F (36.8 C) 98.7 F (37.1 C)  TempSrc: Oral Oral Oral   SpO2: 94% 98% 97% 95%  Weight:    104 kg  Height:       Weight change: 0.7 kg  Physical Exam: General: Conically appearing female, NAD Heart: RRR, no MRG Lungs: CTA, room air, nonlabored breathing Abdomen: Obese bowel sounds normoactive, NTND, mild abdominal  edema Extremities: No pedal edema, left BKA brace/dressing Dialysis Access: RU AVG positive bruit  Dialysis Orders: [facility] MWF 4hrs 2K 2.5Ca EDW 101kg RU AVG Heparin 1000 load and 1000 units qhr EPO 14000 units qHD Venofer '100mg'$  qHD Hectorol 1.74mg qHD  Problem/Plan: 1. Gangrene L foot-> L BKA 2/10 by VVS:in CIR for therapy.  2. 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  Plan per primary. 3. Leukocytosis -staying elevated slightly 13-15K -negative BCxand UCx 4. Thrombocytosis -improving.  5. ESRD: Continue HD per MWF schedulevia AVG- next  today 3/4, then Monday 6. HTN/ volume: BP mostly in goal, this a.m. 151/54 but with hyponatremia (NA 127 on 3 /02) which favors volume excess -  UF 3.7 tolerated 3/02 HD, UF goal 3-4 today , continue homeamlodipineand metoprolol- but continue to challenge 7. DM2- peradmitting team. 8. Anemiaof ESRD: LastHgb7.5,(3/02) Aranesp  102m,  inc to 200 q Friday due  3/4-.Tsat 24% -orderediron 9. 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  10. Nutrition - Renal diet with fluid restrictions. Protein supplements, Vitamins.  ALB 2.5 (3/02)  DaErnest HaberPA-C [id number]/07/2020,12:00 PM  LOS: 14 days    Patient seen and examined, agree with above note with above modifications. Seen on HD- no concerns.  Very stable HD patient - continue with routine HD and appropriate titration of dialysis related meds  KeCorliss ParishMD 06/30/2020     Labs: Basic Metabolic Panel: Recent Labs  Lab 06/23/20 1226 06/26/20 1255 06/28/20 1407  NA 126* 125* 127*  K 4.9 5.4* 5.0  CL 90* 94* 92*  CO2 23 21* 23  GLUCOSE 124* 153* 104*  BUN 60* 73* 57*  CREATININE 9.28* 10.63* 8.68*  CALCIUM 8.6* 8.7* 8.6*  PHOS 5.1* 4.5 3.9   Liver Function Tests: Recent Labs  Lab 06/23/20 1226 06/26/20 1255 06/28/20 1407  ALBUMIN 2.5* 2.4* 2.5*   No results for input(s): LIPASE, AMYLASE in the last 168 hours. No results for input(s): AMMONIA in the last 168 hours. CBC: Recent Labs  Lab 06/23/20 1239 06/26/20 1255 06/28/20 1407  WBC 14.9* 14.8* 14.1*  HGB 7.3* 7.4* 7.5*  HCT 24.1* 24.4* 24.3*  MCV 84.0 85.6 84.4  PLT 612* 586* 575*   Cardiac Enzymes: No results for input(s): CKTOTAL, CKMB, CKMBINDEX, TROPONINI in the last 168 hours. CBG: Recent Labs  Lab 06/28/20 0529 06/28/20 1958 06/29/20 0503 06/29/20 1710 06/30/20 0602  GLUCAP 105* 100* 90 142* 70    Studies/Results: No results found. Medications: . ferric gluconate (FERRLECIT/NULECIT) IV     . (feeding supplement) PROSource Plus  30 mL Oral BID BM  . amLODipine  10 mg Oral  QHS  . aspirin EC  81 mg Oral QHS  . atorvastatin  40 mg Oral QHS  . Chlorhexidine Gluconate Cloth  6 each Topical Q0600  . clopidogrel  75 mg Oral Daily  . darbepoetin (ARANESP) injection - DIALYSIS  200 mcg Intravenous Q Fri-HD  . diclofenac Sodium  2 g Topical QID  . doxercalciferol  1.5 mcg Intravenous Q M,W,F-HD  . Dulaglutide  1.5 mg Subcutaneous Weekly  . gabapentin  300 mg Oral QHS  . heparin  5,000 Units Subcutaneous Q8H  . [START ON 07/01/2020] insulin glargine  18 Units Subcutaneous Daily  .  metoprolol succinate  50 mg Oral Daily  . multivitamin  1 tablet Oral QHS  . polyethylene glycol  17 g Oral Daily  . Ensure Max Protein  11 oz Oral Daily  . senna-docusate  1 tablet Oral BID  . sevelamer carbonate  2,400 mg Oral TID WC

## 2020-06-30 NOTE — Progress Notes (Signed)
Occupational Therapy Session Note  Patient Details  Name: Christy Zhang MRN: [id number] Date of Birth: December 01, 1955  Today's Date: 06/30/2020 OT Individual Time: [id number] OT Individual Time Calculation (min): 59 min   Short Term Goals: Week 2:  OT Short Term Goal 1 (Week 2): Pt will complete LB dressing tasks with Min A using adaptive strategies + AE as needed OT Short Term Goal 2 (Week 2): Pt will complete toilet or BSC transfer with close supervision using LRAD  Skilled Therapeutic Interventions/Progress Updates:    Pt greeted in bed, reporting some residual limb pain last night with pain absolving once her ACE wraps were loosened by staff. She was agreeable to participate in tx today, requesting to start session by using the restroom. Pt reports that she will have a hospital bed at home. OT placed the bariatric drop arm BSC at bedside once pt completed supine<sit unassisted using the bedrail with HOB raised. Close supervision-CGA for lateral scoot to the [facility] given increased time and vcs for hand placement, OT also stabilizing equipment. Pt able to manage her skirt + pull up given close supervision for sitting balance, vcs, and increased time. Pt with B+B void, supervision for hygiene with 1 drop arm lowered, pt again utilizing lateral leans to meet task demands. Pt used the reacher (with her KI doffed) to don new pull up + skirt. Pt able to elevate pull up + skirt over belly but needed assistance for elevating in the back due to body habitus. CGA for sit<stand using RW in order for OT to assist with elevating LB clothing, pt did well with recall of proper hand/foot positioning during sit<stand. Slideboard<bed completed with assistance for board placement + close supervision for dynamic balance while scooting. Pt exhibiting increased ease with slideboard vs scoot to drop arm BSC today. While EOB, pt doffed her gripper sock and donned a new one using the sock aide + reacher. Pt required Min A  overall. Per pt, she had this toileting/LB dressing assistance from daughter PTA. Supervision for slideboard<w/c after OT first placed the board (note ACE bandages rewrapped + KI donned prior to sit<stand + scoot transfers for limb protection). She then engaged in oral care/grooming tasks w/c level with Mod I. We discussed independently purchasing AE for home use. Provided her with printouts to show her daughter. Pt remained sitting up in the w/c at close of session, all needs within reach and safety belt fastened. Tx focus placed on adaptive self care skills, functional transfers, dynamic balance, and pt education/d/c planning.    Pts daughter Christy Zhang was also present in pts room after PT family education session and before HD. Brief OT education provided on using the slideboard for drop arm BSC transfers, AE for toileting/LB dressing tasks, and pts current functional level for self care tasks. Discussed pts need to sponge bathe and use the BSC due to her bathroom being inaccessible via w/c. We also discussed removing KI while pt threaded pull ups and skirt but donning KI prior to sit<stands with RW and slideboard transfers for limb protection. Invited Christy Zhang to attend an OT session this weekend/prior to d/c if she wanted to engage in hands on family training. Christy Zhang stated that she was caregiving for pt PTA and felt comfortable with filling this role again, already reviewed OT handouts with PT. Pt remained sitting up with daughter still present upon OT departure.   Therapy Documentation Precautions:  Precautions Precautions: Fall Precaution Comments: falls, R 2nd toe wound Required Braces or Orthoses:  Knee Immobilizer - Left Knee Immobilizer - Left: Other (comment) Restrictions Weight Bearing Restrictions: No LLE Weight Bearing: Non weight bearing ADL: ADL Eating: Set up Where Assessed-Eating: Wheelchair Grooming: Setup Where Assessed-Grooming: Wheelchair Upper Body Bathing:  Supervision/safety Where Assessed-Upper Body Bathing: Wheelchair Lower Body Bathing: Moderate assistance Where Assessed-Lower Body Bathing: Wheelchair Upper Body Dressing: Minimal assistance Where Assessed-Upper Body Dressing: Wheelchair Lower Body Dressing: Moderate assistance Where Assessed-Lower Body Dressing: Wheelchair Toileting: Moderate assistance Where Assessed-Toileting: Bedside Commode Toilet Transfer: Moderate assistance Toilet Transfer Method: Stand pivot Toilet Transfer Equipment: Radiographer, therapeutic: Not assessed Social research officer, government: Not assessed (Does not shower at baseline)      Therapy/Group: Individual Therapy  P[place] Dresser A Jodel Mayhall 06/30/2020, 12:37 PM

## 2020-06-30 NOTE — Progress Notes (Signed)
PROGRESS NOTE   Subjective/Complaints:  Pt reports no issues- did have pain last night, but thinks due to overdoing it in therapy.  NO concerns. Bowels OK-   ROS:   Pt denies SOB, abd pain, CP, N/V/C/D, and vision changes   Objective:   No results found. Recent Labs    06/28/20 1407  WBC 14.1*  HGB 7.5*  HCT 24.3*  PLT 575*   Recent Labs    06/28/20 1407  NA 127*  K 5.0  CL 92*  CO2 23  GLUCOSE 104*  BUN 57*  CREATININE 8.68*  CALCIUM 8.6*    Intake/Output Summary (Last 24 hours) at 06/30/2020 0825 Last data filed at 06/29/2020 1830 Gross per 24 hour  Intake 240 ml  Output --  Net 240 ml        Physical Exam: Vital Signs Blood pressure (!) 151/54, pulse 71, temperature 98.7 F (37.1 C), resp. rate 18, height '5\' 3"'$  (1.6 m), weight 104 kg, SpO2 95 %.      General: awake, alert, appropriate, NAD- sitting up eating breakfast HENT: conjugate gaze; oropharynx moist- eating CV: regular rate and rhythm; no JVD Pulmonary: CTA B/L; no W/R/R- good air movement GI: soft, NT, ND, (+)BS Psychiatric: appropriate; quiet;  Neurological: Ox3 Skin: intact except LEs- L BKA and R toes- gangrene- R 3rd toe about to autoamputate any minute- Pics as below- no t assessed today Musculoskeletal: TTP over L shoulder anterior top and posterior Cervical back: Normal range of motion.  Comments: L BKA has retention sutures and staples- a little sanguinous drainage on dressing, nothing active- looks good- no dog ears, swelling improved- wearing ACE wraps and KI- looks the same as Tuesday except slightly open on 1 side- steristrips placed- draining slightly from Lateral aspect of L BKA- on dressing, but not actively draining when dressing off.  Skin: General: Skin is warm.  Comments: Chronic changes in RLE, 3rd digit wrapped in kerlex; 2nd and 4th toe have dry gangrene Skin very dry in foot/leg.  Cranial nerve exam  unremarkable. UE motor 4+ to 5/5. RLE: 3+/5 HF, 4/5 KE and 3-4/5 ADF/PF. LLE: able to lift leg off bed about 6". Decreased LT RLE distally.           Assessment/Plan: 1. Functional deficits which require 3+ hours per day of interdisciplinary therapy in a comprehensive inpatient rehab setting.  Physiatrist is providing close team supervision and 24 hour management of active medical problems listed below.  Physiatrist and rehab team continue to assess barriers to discharge/monitor patient progress toward functional and medical goals  Care Tool:  Bathing    Body parts bathed by patient: Face,Right arm,Chest,Left arm,Abdomen,Right upper leg,Front perineal area,Left upper leg   Body parts bathed by helper: Buttocks,Right lower leg Body parts n/a: Left lower leg   Bathing assist Assist Level: Moderate Assistance - Patient 50 - 74%     Upper Body Dressing/Undressing Upper body dressing   What is the patient wearing?: Pull over shirt    Upper body assist Assist Level: Supervision/Verbal cueing    Lower Body Dressing/Undressing Lower body dressing      What is the patient wearing?: Skirt,Underwear/pull up  Lower body assist Assist for lower body dressing: Moderate Assistance - Patient 50 - 74%     Toileting Toileting    Toileting assist Assist for toileting: Moderate Assistance - Patient 50 - 74%     Transfers Chair/bed transfer  Transfers assist     Chair/bed transfer assist level: Minimal Assistance - Patient > 75% Chair/bed transfer assistive device: Sliding board   Locomotion Ambulation   Ambulation assist   Ambulation activity did not occur: Safety/medical concerns  Assist level: Contact Guard/Touching assist Assistive device: Parallel bars Max distance: 8'   Walk 10 feet activity   Assist  Walk 10 feet activity did not occur: Safety/medical concerns        Walk 50 feet activity   Assist Walk 50 feet with 2 turns activity did not  occur: Safety/medical concerns         Walk 150 feet activity   Assist Walk 150 feet activity did not occur: Safety/medical concerns         Walk 10 feet on uneven surface  activity   Assist Walk 10 feet on uneven surfaces activity did not occur: Safety/medical concerns         Wheelchair     Assist Will patient use wheelchair at discharge?: Yes Type of Wheelchair: Manual    Wheelchair assist level: Supervision/Verbal cueing Max wheelchair distance: 100    Wheelchair 50 feet with 2 turns activity    Assist        Assist Level: Supervision/Verbal cueing   Wheelchair 150 feet activity     Assist      Assist Level: Supervision/Verbal cueing   Blood pressure (!) 151/54, pulse 71, temperature 98.7 F (37.1 C), resp. rate 18, height '5\' 3"'$  (1.6 m), weight 104 kg, SpO2 95 %.  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 -patient may showerif residual limb is water-proofed -ELOS/Goals: 7-10 days, Mod I at w/c level  -Continue CIR 2. Antithrombotics: -DVT/anticoagulation:Pharmaceutical:Continue Heparin -antiplatelet therapy: ASA/Plavix 3. Pain Management:ContinueOxycodone prn--received 2 doses 2/20  2/21- having more phantom pain than anything- on no phantom pain meds- will start Gabapentin 300 mg QHS for now- since on HD- max dose we can usually use.   2/22- pain better today- con't regimen  2/24- pt reports some early AM hallucinations- will change to Norco for pain- if doesn't improve, will need to decrease Gabapentin dose  2/25- doesn't report any hallucinations and Norco working well for her- for pain control- con't regimen  3/4- pain controlled with prn Norco and Gabapentin- con't regimen 4. Mood:LCSW to follow for evaluation and support. -antipsychotic agents: N/A 5. Neuropsych: This  patientiscapable of making decisions on herown behalf. 6. Skin/Wound Care:Protein supplement for hypoalbuminemia  --monitor wound for healing.  -apply dry dressing and ACE to residual limb -apply small amount of gauze to protect gangrenous second toe which is about to auto-amputate   2/22- L BKA looking better- healing; stable in RLE/toes- doesn't appear to be source of Leukocytosis  2/28: ordered new knee immobilizer as current not fitting well.  3/1- KI was on wrong per nursing- didn't get new one- put old one on correctly.   3/3- KI fitting well this AM- con't regimen- 7. Fluids/Electrolytes/Nutrition:Strict I/O. Labs with HD. 1200cc/FR. 8. ESRD: Schedule HD at the end of the day on MWF to help with tolerance of therapy.  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   -Cr increased to  11.13 on 2/19- appreciate management by nephrology 9. T2DM with nephropathy: Hgb A1c- 6.4 on last check. Was on Lantus 123XX123 units with Trulicity 1.5 mg/wk.  --Monitor BS ac/hs and use SSI for elevated BS --Continue to titrate Lantus to home dose as indicated   2/27- BGs 95-159- doing great- con't regimen  3/2- BGs great control- con't regimen  3/4- BGs 70-142- will decrease meds secondary to low BG- reduced Lantus to 18 units 10 HTN: Monitor BP tid. Has been labile --Continue Metoprolol and amlodipine.  2/25- BP 120s/60s-70s- well controlled- con't meds per Renal  3/1- BP wel controlled- con't regimen for now, per renal 11. PVD: Continue Plavix, ASA and Lipitor 12. Morbid obesity: BMI 39.01. --educate on diet as well as importance of weight loss to help promote health and mobility.   3/1- BMI up to 40.38- likely weight gain as compared to fluid- esp AFTER BKA, will con't counseling.  13. Anemia of chronic disease: Being managed with weekly Aranesp.  14. Thrombocytosis: Continue to  trend--improved from peak of 774-->600  2/26- Plts 612k- stable- con't to monitor  15. Leukocytosis: discussed with patient that WBC is elevated. She is asymptomatic and without fever, repeat with next dialysis labs to trend  2/21- WBC up to 15k-will check U/A and Cx since pt is oliguric, not anuric- most likely cause vs her dry gangrene of R foot/toes- will reassess in Am and recheck CBC/renal fxn panel in AM- even though it's not at HD, need the results.   2/22- WBC down to 13.3k - U/A and Cx is pending. D/w nursing  2/24- WBC went up to 15.6 yesterday- back down to 13k today/this AM- will do additional ID w/u including blood Cx's.  2/25- Blood Cx's pending; Urine Cx no growth- WBC 14.9 today- asymptomatic- pt says she remembers that she usually has elevated WBC for an unknown reason. If blood C'xs negative, will ask her PCP to address after d/c.    2/27- no growth x 2 days so far- will con't to monitor  2/28: WBC stable, continue to monitor.  3/3- WBC still 14k, but so far, entire ID work up (-)- will have pt f/u outpt- could be from autoamputation    16. Hyperkalemia/Hyponatremia  2/21- will d/w renal- NA down to 126 and K+ up to 5.6.   2/22- K+ down to 4.0 and Na up to 130- con't per renal  2/24- NA 129- up from 124 and K+ is 4.3- per renal.   2/25- Na 126 today- per renal- thank you for assistance.   3/1- Na 125- per renal with HD.  3/4- labs Monday    LOS: 14 days A FACE TO FACE EVALUATION WAS PERFORMED  Christy Zhang 06/30/2020, 8:25 AM

## 2020-06-30 NOTE — Plan of Care (Signed)
OT POC modified to reflect pts progress. Discussed pts modified goals with daughter [name] who stated she can provide this assistance at home Problem: RH Balance Goal: LTG: Patient will maintain dynamic sitting balance (OT) Description: LTG:  Patient will maintain dynamic sitting balance with assistance during activities of daily living (OT) Flowsheets (Taken 06/30/2020 1607) LTG: Pt will maintain dynamic sitting balance during ADLs with: Supervision/Verbal cueing Note: Downgraded due to slow progress Goal: LTG Patient will maintain dynamic standing with ADLs (OT) Description: LTG:  Patient will maintain dynamic standing balance with assist during activities of daily living (OT)  Flowsheets (Taken 06/30/2020 1607) LTG: Pt will maintain dynamic standing balance during ADLs with: Contact Guard/Touching assist Note: Downgraded due to slow progress   Problem: Sit to Stand Goal: LTG:  Patient will perform sit to stand in prep for activites of daily living with assistance level (OT) Description: LTG:  Patient will perform sit to stand in prep for activites of daily living with assistance level (OT) Flowsheets (Taken 06/30/2020 1607) LTG: PT will perform sit to stand in prep for activites of daily living with assistance level: Contact Guard/Touching assist Note: Downgraded due to slow progress   Problem: RH Dressing Goal: LTG Patient will perform upper body dressing (OT) Description: LTG Patient will perform upper body dressing with assist, with/without cues (OT). Flowsheets (Taken 06/30/2020 1607) LTG: Pt will perform upper body dressing with assistance level of: Set up assist Note: Downgraded due to slow progress   Problem: RH Toileting Goal: LTG Patient will perform toileting task (3/3 steps) with assistance level (OT) Description: LTG: Patient will perform toileting task (3/3 steps) with assistance level (OT)  Flowsheets (Taken 06/30/2020 1607) LTG: Pt will perform toileting task (3/3 steps) with  assistance level: Minimal Assistance - Patient > 75% Note: Downgraded due to slow progress

## 2020-07-01 LAB — GLUCOSE, CAPILLARY
Glucose-Capillary: 86 mg/dL (ref 70–99)
Glucose-Capillary: 116 mg/dL — ABNORMAL HIGH (ref 70–99)

## 2020-07-01 MED ORDER — DULAGLUTIDE 1.5 MG/0.5ML ~~LOC~~ SOAJ
1.5000 mg | SUBCUTANEOUS | Status: DC
  Administered 2020-07-01: 1.5 mg via SUBCUTANEOUS
  Filled 2020-07-01: qty 0.5

## 2020-07-01 MED ORDER — AMLODIPINE BESYLATE 5 MG PO TABS
5.0000 mg | ORAL_TABLET | Freq: Every day | ORAL | Status: DC
  Administered 2020-07-01 – 2020-07-03 (×3): 5 mg via ORAL
  Filled 2020-07-01 (×3): qty 1

## 2020-07-01 MED ORDER — SODIUM CHLORIDE 0.9 % IV SOLN: 62.5000 mg | INTRAVENOUS | Status: DC

## 2020-07-01 NOTE — Progress Notes (Addendum)
Subjective: Alert sitting upright in bed, said cramps with her 3.7 L UF on HD yesterday  Objective Vital signs in last 24 hours: Vitals:   06/30/20 1813 06/30/20 1900 07/01/20 0500 07/01/20 0511  BP: (!) 149/56 (!) 142/44  (!) 127/48  Pulse: 72 76  71  Resp: '18 18  18  '$ Temp: 98.4 F (36.9 C) 98.5 F (36.9 C)  99.6 F (37.6 C)  TempSrc: Oral Oral  Oral  SpO2: 98% 98%  96%  Weight:   101 kg 102.1 kg  Height:       Weight change: -1 kg   Physical Exam: General: Alert obese chronically ill appearing female, NAD Heart: RRR, no MRG Lungs: CTA, room air, nonlabored breathing Abdomen: Obese bowel sounds normoactive, NTND, mild abdominal edema Extremities: No pedal edema, left BKA brace/dressing Dialysis Access: RU AVG positive bruit  Dialysis Orders: [facility] MWF 4hrs 2K 2.5Ca EDW 101kg RU AVG Heparin 1000 load and 1000 units qhr EPO 14000 units qHD Venofer '100mg'$  qHD Hectorol 1.81mg qHD  Problem/Plan: 1. Gangrene L foot-> L BKA 2/10 by VVS:in CIR for therapy.  2. 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  Plan per primary. 3. Leukocytosis -staying elevated slightly 13-15K -negative BCxand UCx 4. Thrombocytosis -improving.  5. ESRD: Continue HD per MWF schedulevia AVG- next 307  Monday 6. HTN/ volume: BP mostly in goal, this a.m.  127/48 but with hyponatremia (NA 127 on 3 /04) which favors volume excess - UF 3.7 yesterday tells me she cramped at end of HD , on amlodipine 10 mg(will decrease to 5 mg )and metoprolol 50 mg XL daily continue to challengeafter decreasing amlodipine follow-up BP trend and UF 7. DM2- peradmitting team. 8. Anemiaof ESRD: LastHgb7.8,(3/04) Aranesp 1027m, inc to 200 qFriday given3/4-.Tsat 24% 2/22-completedironload, will give weekly 50 mg 9. MBDof ESRD-Calciumcorrected = 10.2 and phos 5.5 in goal.Continue renvela and hectorol 1.5 decr to 0.5  Ck pth   10. Nutrition - Renal diet with fluid restrictions.  Protein supplements, Vitamins. ALB 2.5 (3/04)  DaErnest HaberPA-C [phone]/08/2020,11:17 AM  LOS: 15 days    Patient seen and examined, agree with above note with above modifications. Did not tell me of her cramping.  Otherwise stable-  Plan for next routine HD on Monday with appropriate titration of dialysis related meds-  hgb maybe starting to budge.  As pt is stable we will not round on her tomorrow-  Will revisit Monday-  Also due for her routine HD on Monday.  Please call if there are any concerns for usKoreaomorrow  KeCorliss ParishMD 07/01/2020     Labs: Basic Metabolic Panel: Recent Labs  Lab 06/26/20 1255 06/28/20 1407 06/30/20 1407  NA 125* 127* 127*  K 5.4* 5.0 5.1  CL 94* 92* 92*  CO2 21* 23 21*  GLUCOSE 153* 104* 143*  BUN 73* 57* 61*  CREATININE 10.63* 8.68* 9.60*  CALCIUM 8.7* 8.6* 9.0  PHOS 4.5 3.9 5.5*   Liver Function Tests: Recent Labs  Lab 06/26/20 1255 06/28/20 1407 06/30/20 1407  ALBUMIN 2.4* 2.5* 2.5*   No results for input(s): LIPASE, AMYLASE in the last 168 hours. No results for input(s): AMMONIA in the last 168 hours. CBC: Recent Labs  Lab 06/26/20 1255 06/28/20 1407 06/30/20 2054  WBC 14.8* 14.1* 14.7*  HGB 7.4* 7.5* 7.8*  HCT 24.4* 24.3* 25.5*  MCV 85.6 84.4 83.3  PLT 586* 575* 574*   Cardiac Enzymes: No results for input(s): CKTOTAL,  CKMB, CKMBINDEX, TROPONINI in the last 168 hours. CBG: Recent Labs  Lab 06/29/20 0503 06/29/20 1710 06/30/20 0602 06/30/20 1809 07/01/20 0508  GLUCAP 90 142* 70 119* 86    Studies/Results: No results found. Medications:  . (feeding supplement) PROSource Plus  30 mL Oral BID BM  . amLODipine  10 mg Oral QHS  . aspirin EC  81 mg Oral QHS  . atorvastatin  40 mg Oral QHS  . Chlorhexidine Gluconate Cloth  6 each Topical Q0600  . clopidogrel  75 mg Oral Daily  . darbepoetin (ARANESP) injection - DIALYSIS  200 mcg Intravenous Q Fri-HD  . diclofenac  Sodium  2 g Topical QID  . doxercalciferol  1.5 mcg Intravenous Q M,W,F-HD  . Dulaglutide  1.5 mg Subcutaneous Q Sat  . gabapentin  300 mg Oral QHS  . heparin  5,000 Units Subcutaneous Q8H  . insulin glargine  18 Units Subcutaneous Daily  . metoprolol succinate  50 mg Oral Daily  . multivitamin  1 tablet Oral QHS  . polyethylene glycol  17 g Oral Daily  . Ensure Max Protein  11 oz Oral Daily  . senna-docusate  1 tablet Oral BID  . sevelamer carbonate  2,400 mg Oral TID WC

## 2020-07-01 NOTE — Progress Notes (Signed)
PROGRESS NOTE   Subjective/Complaints:  No issues overnite except ACE was on too tight, re wrapped , feels better   ROS:   Pt denies SOB, abd pain, CP, N/V/C/D, and vision changes   Objective:   No results found. Recent Labs    06/28/20 1407 06/30/20 2054  WBC 14.1* 14.7*  HGB 7.5* 7.8*  HCT 24.3* 25.5*  PLT 575* 574*   Recent Labs    06/28/20 1407 06/30/20 1407  NA 127* 127*  K 5.0 5.1  CL 92* 92*  CO2 23 21*  GLUCOSE 104* 143*  BUN 57* 61*  CREATININE 8.68* 9.60*  CALCIUM 8.6* 9.0    Intake/Output Summary (Last 24 hours) at 07/01/2020 0801 Last data filed at 07/01/2020 0725 Gross per 24 hour  Intake 118 ml  Output 3500 ml  Net -3382 ml        Physical Exam: Vital Signs Blood pressure (!) 127/48, pulse 71, temperature 99.6 F (37.6 C), temperature source Oral, resp. rate 18, height '5\' 3"'$  (1.6 m), weight 102.1 kg, SpO2 96 %.   General: No acute distress Mood and affect are appropriate Heart: Regular rate and rhythm no rubs murmurs or extra sounds Lungs: Clear to auscultation, breathing unlabored, no rales or wheezes Abdomen: Positive bowel sounds, soft nontender to palpation, nondistended   Skin: intact except LEs- L BKA and R toes- gangrene- R 3rd toe about to autoamputate  Musculoskeletal:No UE pain with ROM 5/5 in BUE 4/5 RLE 4/5 L hip flex Sensation absent below RIght ankle Ext CHarcot foot deformity on RIght           Assessment/Plan: 1. Functional deficits which require 3+ hours per day of interdisciplinary therapy in a comprehensive inpatient rehab setting.  Physiatrist is providing close team supervision and 24 hour management of active medical problems listed below.  Physiatrist and rehab team continue to assess barriers to discharge/monitor patient progress toward functional and medical goals  Care Tool:  Bathing    Body parts bathed by patient: Face,Right  arm,Chest,Left arm,Abdomen,Right upper leg,Front perineal area,Left upper leg   Body parts bathed by helper: Buttocks,Right lower leg Body parts n/a: Left lower leg   Bathing assist Assist Level: Moderate Assistance - Patient 50 - 74%     Upper Body Dressing/Undressing Upper body dressing   What is the patient wearing?: Pull over shirt    Upper body assist Assist Level: Supervision/Verbal cueing    Lower Body Dressing/Undressing Lower body dressing      What is the patient wearing?: Underwear/pull up,Skirt     Lower body assist Assist for lower body dressing: Minimal Assistance - Patient > 75%     Toileting Toileting    Toileting assist Assist for toileting: Minimal Assistance - Patient > 75%     Transfers Chair/bed transfer  Transfers assist     Chair/bed transfer assist level: Minimal Assistance - Patient > 75% Chair/bed transfer assistive device: Sliding board   Locomotion Ambulation   Ambulation assist   Ambulation activity did not occur: Safety/medical concerns  Assist level: Contact Guard/Touching assist Assistive device: Parallel bars Max distance: 8'   Walk 10 feet activity   Assist  Walk 10  feet activity did not occur: Safety/medical concerns        Walk 50 feet activity   Assist Walk 50 feet with 2 turns activity did not occur: Safety/medical concerns         Walk 150 feet activity   Assist Walk 150 feet activity did not occur: Safety/medical concerns         Walk 10 feet on uneven surface  activity   Assist Walk 10 feet on uneven surfaces activity did not occur: Safety/medical concerns         Wheelchair     Assist Will patient use wheelchair at discharge?: Yes Type of Wheelchair: Manual    Wheelchair assist level: Supervision/Verbal cueing Max wheelchair distance: 100    Wheelchair 50 feet with 2 turns activity    Assist        Assist Level: Supervision/Verbal cueing   Wheelchair 150 feet  activity     Assist      Assist Level: Supervision/Verbal cueing   Blood pressure (!) 127/48, pulse 71, temperature 99.6 F (37.6 C), temperature source Oral, resp. rate 18, height '5\' 3"'$  (1.6 m), weight 102.1 kg, SpO2 96 %.  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 -patient may showerif residual limb is water-proofed -ELOS/Goals: 7-10 days, Mod I at w/c level  -Continue CIR PT, OT 2. Antithrombotics: -DVT/anticoagulation:Pharmaceutical:Continue Heparin -antiplatelet therapy: ASA/Plavix 3. Pain Management:ContinueOxycodone prn--received 2 doses 2/20  2/21- having more phantom pain than anything- on no phantom pain meds- will start Gabapentin 300 mg QHS for now- since on HD- max dose we can usually use.   2/22- pain better today- con't regimen  2/24- pt reports some early AM hallucinations- will change to Norco for pain- if doesn't improve, will need to decrease Gabapentin dose  2/25- doesn't report any hallucinations and Norco working well for her- for pain control- con't regimen  3/4- pain controlled with prn Norco and Gabapentin- con't regimen 4. Mood:LCSW to follow for evaluation and support. -antipsychotic agents: N/A 5. Neuropsych: This patientiscapable of making decisions on herown behalf. 6. Skin/Wound Care:Protein supplement for hypoalbuminemia  --monitor wound for healing.  -apply dry dressing and ACE to residual limb -apply small amount of gauze to protect gangrenous second toe which is about to auto-amputate   2/22- L BKA looking better- healing; stable in RLE/toes- doesn't appear to be source of Leukocytosis  2/28: ordered new knee immobilizer as current not fitting well.  3/1- KI was on wrong per nursing- didn't get new one- put old one on correctly.   3/3- KI fitting well this AM-  con't regimen- 7. Fluids/Electrolytes/Nutrition:Strict I/O. Labs with HD. 1200cc/FR. 8. ESRD: Schedule HD at the end of the day on MWF to help with tolerance of therapy.  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   -Cr increased to 11.13 on 2/19- appreciate management by nephrology 9. T2DM with nephropathy: Hgb A1c- 6.4 on last check. Was on Lantus 123XX123 units with Trulicity 1.5 mg/wk.  --Monitor BS ac/hs and use SSI for elevated BS --Continue to titrate Lantus to home dose as indicated   2/27- BGs 95-159- doing great- con't regimen  3/2- BGs great control- con't regimen  3/4- BGs 70-142- will decrease meds secondary to low BG- reduced Lantus to 18 units 10 HTN: Monitor BP tid. Has been labile --Continue Metoprolol and amlodipine.  2/25- BP 120s/60s-70s- well controlled- con't meds per Renal  3/1- BP wel  controlled- con't regimen for now, per renal Vitals:   06/30/20 1900 07/01/20 0511  BP: (!) 142/44 (!) 127/48  Pulse: 76 71  Resp: 18 18  Temp: 98.5 F (36.9 C) 99.6 F (37.6 C)  SpO2: 98% 96%   11. PVD: Continue Plavix, ASA and Lipitor 12. Morbid obesity: BMI 39.01. --educate on diet as well as importance of weight loss to help promote health and mobility.   3/1- BMI up to 40.38- likely weight gain as compared to fluid- esp AFTER BKA, will con't counseling.  13. Anemia of chronic disease: Being managed with weekly Aranesp.  14. Thrombocytosis: Continue to trend--improved from peak of 774-->600  2/26- Plts 612k- stable- con't to monitor  15. Leukocytosis: discussed with patient that WBC is elevated. She is asymptomatic and without fever, repeat with next dialysis labs to trend  2/21- WBC up to 15k-will check U/A and Cx since pt is oliguric, not anuric- most likely cause vs her dry gangrene of R foot/toes- will reassess in Am and recheck CBC/renal fxn panel in AM- even though it's not at HD, need  the results.   2/22- WBC down to 13.3k - U/A and Cx is pending. D/w nursing  2/24- WBC went up to 15.6 yesterday- back down to 13k today/this AM- will do additional ID w/u including blood Cx's.  2/25- Blood Cx's pending; Urine Cx no growth- WBC 14.9 today- asymptomatic- pt says she remembers that she usually has elevated WBC for an unknown reason. If blood C'xs negative, will ask her PCP to address after d/c.    2/27- no growth x 2 days so far- will con't to monitor  2/28: WBC stable, continue to monitor.  3/3- WBC still 14k, but so far, entire ID work up (-)- will have pt f/u outpt- could be from autoamputation    16. Hyperkalemia/Hyponatremia  2/21- will d/w renal- NA down to 126 and K+ up to 5.6.   2/22- K+ down to 4.0 and Na up to 130- con't per renal  2/24- NA 129- up from 124 and K+ is 4.3- per renal.   2/25- Na 126 today- per renal- thank you for assistance.   3/1- Na 125- per renal with HD.  3/4- labs Monday    LOS: 15 days A FACE TO [place] E Rynell Ciotti 07/01/2020, 8:01 AM

## 2020-07-02 LAB — GLUCOSE, CAPILLARY
Glucose-Capillary: 106 mg/dL — ABNORMAL HIGH (ref 70–99)
Glucose-Capillary: 116 mg/dL — ABNORMAL HIGH (ref 70–99)

## 2020-07-02 NOTE — Progress Notes (Signed)
Physical Therapy Session Note  Patient Details  Name: Christy Zhang MRN: [id number] Date of Birth: 06-12-1955  Today's Date: 07/02/2020 PT Individual Time: 1303-1400 PT Individual Time Calculation (min): 57 min   Short Term Goals: Week 2:  PT Short Term Goal 1 (Week 2): STG=LTG due to ELOS  Skilled Therapeutic Interventions/Progress Updates:     Patient in w/c in the room upon PT arrival. Patient alert and agreeable to PT session. Patient denied pain during session. Noted KI soiled at beginning of session, washed KI while patient performed lower extremity exercises, see below.   Therapeutic Activity: Bed Mobility: Patient performed sit to supine and rolling supine<>prone with supervision for cues for attention to L residual limb placement for NWB.  Transfers: Patient performed a slide board transfer w/c>bed with mod cues for w/c set-up due to poor recall, supervision for transfer, and min A for board placement. Provided cues for board placement and w/c set-up only.   Wheelchair Mobility:  Patient propelled wheelchair in the room, performing tight turns to simulate home environment with mod I.   Therapeutic Exercise: Patient performed one set of the following exercises provided with HEP handout with min verbal and tactile cues for proper technique. New HEP provided due to patient being unable to read the previous HEP that was in patient's room. Access Code: [email or web address] Supine Quad Set (BKA) - 2 x daily - 7 x weekly - 2 sets - 10 reps - 5 swc hold Supine Gluteal Sets - 2 x daily - 7 x weekly - 2 sets - 10 reps - 5 sec hold Supine Active Straight Leg Raise - 2 x daily - 7 x weekly - 2 sets - 10 reps Supine Isometric Hip Adduction with Towel Roll (AKA) - 2 x daily - 7 x weekly - 2 sets - 10 reps Supine Single Leg Bridge with Sound Leg (BKA) - 2 x daily - 7 x weekly - 2 sets - 10 reps Sidelying Hip Abduction (AKA) - 2 x daily - 7 x weekly - 2 sets - 10 reps Prone Knee Flexion - 2 x daily -  7 x weekly - 2 sets - 10 reps Prone Hip Extension with Residual Limb (BKA) - 2 x daily - 7 x weekly - 2 sets - 10 reps Seated Knee Extension (BKA) - 2 x daily - 7 x weekly - 2 sets - 10 reps Chair Push-Up (AKA) - 2 x daily - 7 x weekly - 2 sets - 10 reps   Assessed patient's residual limb following exercises. No drainage noted and incision clean and healing well. Required total A for dressing and ACE wrapping due to patient's body habitus. Educated on wrapping technique and safety during.   Patient in bed at end of session with breaks locked, bed alarm set, and all needs within reach.    Therapy Documentation Precautions:  Precautions Precautions: Fall Precaution Comments: falls, R 2nd toe wound Required Braces or Orthoses: Knee Immobilizer - Left Knee Immobilizer - Left: Other (comment) Restrictions Weight Bearing Restrictions: Yes LLE Weight Bearing: Non weight bearing   Therapy/Group: Individual Therapy  Asna Muldrow L Kaleb Sek PT, DPT  07/02/2020, 4:12 PM

## 2020-07-02 NOTE — Progress Notes (Signed)
Occupational Therapy Session Note  Patient Details  Name: Christy Zhang MRN: [id number] Date of Birth: 05/01/1955  Today's Date: 07/02/2020 OT Group Time: 1100-1200 OT Group Time Calculation (min): 60 min  Skilled Therapeutic Interventions/Progress Updates:    Pt engaged in therapeutic w/c level dance group focusing on patient choice, UE/LE strengthening, salience, activity tolerance, and social participation. Pt was guided through various dance-based exercises involving UEs/LEs and trunk. All music was selected by group members. Emphasis placed on general UB strengthening and endurance. Pt participated well during exercises and took rest breaks as needed, affect appearing bright in social context. At end of session pt was returned to the room by RT.    Therapy Documentation Precautions:  Precautions Precautions: Fall Precaution Comments: falls, R 2nd toe wound Required Braces or Orthoses: Knee Immobilizer - Left Knee Immobilizer - Left: Other (comment) Restrictions Weight Bearing Restrictions: Yes LLE Weight Bearing: Non weight bearing Pain: no c/o pain during tx   ADL: ADL Eating: Set up Where Assessed-Eating: Wheelchair Grooming: Setup Where Assessed-Grooming: Wheelchair Upper Body Bathing: Supervision/safety Where Assessed-Upper Body Bathing: Wheelchair Lower Body Bathing: Moderate assistance Where Assessed-Lower Body Bathing: Wheelchair Upper Body Dressing: Minimal assistance Where Assessed-Upper Body Dressing: Wheelchair Lower Body Dressing: Moderate assistance Where Assessed-Lower Body Dressing: Wheelchair Toileting: Moderate assistance Where Assessed-Toileting: Bedside Commode Toilet Transfer: Moderate assistance Toilet Transfer Method: Stand pivot Toilet Transfer Equipment: Radiographer, therapeutic: Not assessed Social research officer, government: Not assessed (Does not shower at baseline)      Therapy/Group: Group Therapy  Branda Chaudhary A Vincent Streater 07/02/2020,  12:40 PM

## 2020-07-03 LAB — RENAL FUNCTION PANEL
Albumin: 2.4 g/dL — ABNORMAL LOW (ref 3.5–5.0)
Anion gap: 14 (ref 5–15)
BUN: 64 mg/dL — ABNORMAL HIGH (ref 8–23)
CO2: 21 mmol/L — ABNORMAL LOW (ref 22–32)
Calcium: 9.1 mg/dL (ref 8.9–10.3)
Chloride: 92 mmol/L — ABNORMAL LOW (ref 98–111)
Creatinine, Ser: 10.64 mg/dL — ABNORMAL HIGH (ref 0.44–1.00)
GFR, Estimated: 4 mL/min — ABNORMAL LOW (ref >60)
Glucose, Bld: 69 mg/dL — ABNORMAL LOW (ref 70–99)
Phosphorus: 5.7 mg/dL — ABNORMAL HIGH (ref 2.5–4.6)
Potassium: 4.9 mmol/L (ref 3.5–5.1)
Sodium: 127 mmol/L — ABNORMAL LOW (ref 135–145)

## 2020-07-03 LAB — CBC WITH DIFFERENTIAL/PLATELET
Abs Immature Granulocytes: 0.11 10*3/uL — ABNORMAL HIGH (ref 0.00–0.07)
Basophils Absolute: 0.1 10*3/uL (ref 0.0–0.1)
Basophils Relative: 0 %
Eosinophils Absolute: 1 10*3/uL — ABNORMAL HIGH (ref 0.0–0.5)
Eosinophils Relative: 6 %
HCT: 27 % — ABNORMAL LOW (ref 36.0–46.0)
Hemoglobin: 8.3 g/dL — ABNORMAL LOW (ref 12.0–15.0)
Immature Granulocytes: 1 %
Lymphocytes Relative: 14 %
Lymphs Abs: 2.3 10*3/uL (ref 0.7–4.0)
MCH: 25.5 pg — ABNORMAL LOW (ref 26.0–34.0)
MCHC: 30.7 g/dL (ref 30.0–36.0)
MCV: 83.1 fL (ref 80.0–100.0)
Monocytes Absolute: 1 10*3/uL (ref 0.1–1.0)
Monocytes Relative: 7 %
Neutro Abs: 11.5 10*3/uL — ABNORMAL HIGH (ref 1.7–7.7)
Neutrophils Relative %: 72 %
Platelets: 666 10*3/uL — ABNORMAL HIGH (ref 150–400)
RBC: 3.25 MIL/uL — ABNORMAL LOW (ref 3.87–5.11)
RDW: 17.3 % — ABNORMAL HIGH (ref 11.5–15.5)
WBC: 16 10*3/uL — ABNORMAL HIGH (ref 4.0–10.5)
nRBC: 0 % (ref 0.0–0.2)

## 2020-07-03 LAB — GLUCOSE, CAPILLARY
Glucose-Capillary: 76 mg/dL (ref 70–99)
Glucose-Capillary: 104 mg/dL — ABNORMAL HIGH (ref 70–99)
Glucose-Capillary: 85 mg/dL (ref 70–99)
Glucose-Capillary: 105 mg/dL — ABNORMAL HIGH (ref 70–99)

## 2020-07-03 MED ORDER — INSULIN GLARGINE 100 UNIT/ML ~~LOC~~ SOLN
16.0000 [IU] | Freq: Every day | SUBCUTANEOUS | Status: DC
  Administered 2020-07-04: 16 [IU] via SUBCUTANEOUS
  Filled 2020-07-03: qty 0.16

## 2020-07-03 MED ORDER — HEPARIN SODIUM (PORCINE) 1000 UNIT/ML IJ SOLN
INTRAMUSCULAR | Status: AC
  Administered 2020-07-03: 1000 [IU]
  Filled 2020-07-03: qty 4

## 2020-07-03 MED ORDER — DOXERCALCIFEROL 4 MCG/2ML IV SOLN
INTRAVENOUS | Status: AC
  Filled 2020-07-03: qty 2

## 2020-07-03 NOTE — Progress Notes (Signed)
PROGRESS NOTE   Subjective/Complaints:  LBM 2 nights ago- feels good otherwise- needs to have BM today per pt.   ROS:   Pt denies SOB, abd pain, CP, N/V/C/D, and vision changes   Objective:   No results found. Recent Labs    06/30/20 2054 07/03/20 0620  WBC 14.7* 16.0*  HGB 7.8* 8.3*  HCT 25.5* 27.0*  PLT 574* 666*   Recent Labs    06/30/20 1407 07/03/20 0620  NA 127* 127*  K 5.1 4.9  CL 92* 92*  CO2 21* 21*  GLUCOSE 143* 69*  BUN 61* 64*  CREATININE 9.60* 10.64*  CALCIUM 9.0 9.1    Intake/Output Summary (Last 24 hours) at 07/03/2020 0853 Last data filed at 07/02/2020 1815 Gross per 24 hour  Intake 377 ml  Output --  Net 377 ml        Physical Exam: Vital Signs Blood pressure (!) 148/53, pulse 69, temperature 97.8 F (36.6 C), resp. rate 16, height '5\' 3"'$  (1.6 m), weight 103.3 kg, SpO2 99 %.    General: awake, alert, appropriate, NAD- sitting up eating breakfast HENT: conjugate gaze; oropharynx moist CV: regular rate and rhythm; no JVD Pulmonary: CTA B/L; no W/R/R- good air movement GI: soft, NT, ND, (+)BS- protuberant Psychiatric: appropriate, quiet Neurological: Ox3 Skin: intact except LEs- L BKA and R toes- gangrene- R 3rd toe about to autoamputate- still hasn't.   Musculoskeletal:No UE pain with ROM 5/5 in BUE 4/5 RLE 4/5 L hip flex Sensation absent below RIght ankle Ext CHarcot foot deformity on RIght     Assessment/Plan: 1. Functional deficits which require 3+ hours per day of interdisciplinary therapy in a comprehensive inpatient rehab setting.  Physiatrist is providing close team supervision and 24 hour management of active medical problems listed below.  Physiatrist and rehab team continue to assess barriers to discharge/monitor patient progress toward functional and medical goals  Care Tool:  Bathing    Body parts bathed by patient: Face,Right arm,Chest,Left  arm,Abdomen,Right upper leg,Front perineal area,Left upper leg   Body parts bathed by helper: Buttocks,Right lower leg Body parts n/a: Left lower leg   Bathing assist Assist Level: Moderate Assistance - Patient 50 - 74%     Upper Body Dressing/Undressing Upper body dressing   What is the patient wearing?: Pull over shirt    Upper body assist Assist Level: Supervision/Verbal cueing    Lower Body Dressing/Undressing Lower body dressing      What is the patient wearing?: Underwear/pull up,Skirt     Lower body assist Assist for lower body dressing: Minimal Assistance - Patient > 75%     Toileting Toileting    Toileting assist Assist for toileting: Minimal Assistance - Patient > 75%     Transfers Chair/bed transfer  Transfers assist     Chair/bed transfer assist level: Supervision/Verbal cueing Chair/bed transfer assistive device: Sliding board   Locomotion Ambulation   Ambulation assist   Ambulation activity did not occur: Safety/medical concerns  Assist level: Contact Guard/Touching assist Assistive device: Parallel bars Max distance: 8'   Walk 10 feet activity   Assist  Walk 10 feet activity did not occur: Safety/medical concerns  Walk 50 feet activity   Assist Walk 50 feet with 2 turns activity did not occur: Safety/medical concerns         Walk 150 feet activity   Assist Walk 150 feet activity did not occur: Safety/medical concerns         Walk 10 feet on uneven surface  activity   Assist Walk 10 feet on uneven surfaces activity did not occur: Safety/medical concerns         Wheelchair     Assist Will patient use wheelchair at discharge?: Yes Type of Wheelchair: Manual    Wheelchair assist level: Supervision/Verbal cueing Max wheelchair distance: 100    Wheelchair 50 feet with 2 turns activity    Assist        Assist Level: Supervision/Verbal cueing   Wheelchair 150 feet activity     Assist       Assist Level: Supervision/Verbal cueing   Blood pressure (!) 148/53, pulse 69, temperature 97.8 F (36.6 C), resp. rate 16, height '5\' 3"'$  (1.6 m), weight 103.3 kg, SpO2 99 %.  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 -patient may showerif residual limb is water-proofed -ELOS/Goals: 7-10 days, Mod I at w/c level  -Continue CIR PT, OT 2. Antithrombotics: -DVT/anticoagulation:Pharmaceutical:Continue Heparin -antiplatelet therapy: ASA/Plavix 3. Pain Management:ContinueOxycodone prn--received 2 doses 2/20  2/21- having more phantom pain than anything- on no phantom pain meds- will start Gabapentin 300 mg QHS for now- since on HD- max dose we can usually use.   2/22- pain better today- con't regimen  2/24- pt reports some early AM hallucinations- will change to Norco for pain- if doesn't improve, will need to decrease Gabapentin dose  2/25- doesn't report any hallucinations and Norco working well for her- for pain control- con't regimen  3/7- Norco working well- taking occ, not frequent- con't regimen 4. Mood:LCSW to follow for evaluation and support. -antipsychotic agents: N/A 5. Neuropsych: This patientiscapable of making decisions on herown behalf. 6. Skin/Wound Care:Protein supplement for hypoalbuminemia  --monitor wound for healing.  -apply dry dressing and ACE to residual limb -apply small amount of gauze to protect gangrenous second/third toe which is about to auto-amputate   2/22- L BKA looking better- healing; stable in RLE/toes- doesn't appear to be source of Leukocytosis  3/1- KI was on wrong per nursing- didn't get new one- put old one on correctly.   3/3- KI fitting well this AM- con't regimen-  3/7- no change- con't wound care 7. Fluids/Electrolytes/Nutrition:Strict I/O. Labs with HD.  1200cc/FR. 8. ESRD: Schedule HD at the end of the day on MWF to help with tolerance of therapy.  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   -Cr increased to 11.13 on 2/19- appreciate management by nephrology 9. T2DM with nephropathy: Hgb A1c- 6.4 on last check. Was on Lantus 123XX123 units with Trulicity 1.5 mg/wk.  --Monitor BS ac/hs and use SSI for elevated BS --Continue to titrate Lantus to home dose as indicated   2/27- BGs 95-159- doing great- con't regimen  3/2- BGs great control- con't regimen  3/4- BGs 70-142- will decrease meds secondary to low BG- reduced Lantus to 18 units  3/7- BGs 69-113- will reduce Lantus to 16 units.  10 HTN: Monitor BP tid. Has been labile --Continue Metoprolol and amlodipine.  2/25- BP 120s/60s-70s- well controlled- con't meds per Renal  3/7- BP slightly high this AM, but otherwise controlled- con't regimen Vitals:  07/02/20 2004 07/03/20 0433  BP: (!) 136/39 (!) 148/53  Pulse: 71 69  Resp: 18 16  Temp: 98.6 F (37 C) 97.8 F (36.6 C)  SpO2: 95% 99%   11. PVD: Continue Plavix, ASA and Lipitor 12. Morbid obesity: BMI 39.01. --educate on diet as well as importance of weight loss to help promote health and mobility.   3/1- BMI up to 40.38- likely weight gain as compared to fluid- esp AFTER BKA, will con't counseling.  13. Anemia of chronic disease: Being managed with weekly Aranesp.  14. Thrombocytosis: Continue to trend--improved from peak of 774-->600  2/26- Plts 612k- stable- con't to monitor  15. Leukocytosis: discussed with patient that WBC is elevated. She is asymptomatic and without fever, repeat with next dialysis labs to trend  2/21- WBC up to 15k-will check U/A and Cx since pt is oliguric, not anuric- most likely cause vs her dry gangrene of R foot/toes- will reassess in Am and recheck CBC/renal fxn panel in AM- even though it's not at HD, need the results.    2/22- WBC down to 13.3k - U/A and Cx is pending. D/w nursing  2/24- WBC went up to 15.6 yesterday- back down to 13k today/this AM- will do additional ID w/u including blood Cx's.  2/25- Blood Cx's pending; Urine Cx no growth- WBC 14.9 today- asymptomatic- pt says she remembers that she usually has elevated WBC for an unknown reason. If blood C'xs negative, will ask her PCP to address after d/c.    2/27- no growth x 2 days so far- will con't to monitor  2/28: WBC stable, continue to monitor.  3/3- WBC still 14k, but so far, entire ID work up (-)- will have pt f/u outpt- could be from autoamputation   3/7- WBC slightly more at 16k- no change in skin- con't to monitor-    16. Hyperkalemia/Hyponatremia  2/21- will d/w renal- NA down to 126 and K+ up to 5.6.   2/22- K+ down to 4.0 and Na up to 130- con't per renal  2/24- NA 129- up from 124 and K+ is 4.3- per renal.   2/25- Na 126 today- per renal- thank you for assistance.   3/1- Na 125- per renal with HD.  3/4- labs Monday  3/7- Na 127- per renal    LOS: 17 days A FACE TO FACE EVALUATION WAS PERFORMED  Monica Codd 07/03/2020, 8:53 AM

## 2020-07-03 NOTE — Progress Notes (Signed)
Pt down to dialysis per bed. Pt took phone with her off unit.  Sheela Stack, LPN

## 2020-07-03 NOTE — Procedures (Signed)
   I was present at this dialysis session, have reviewed the session itself and made  appropriate changes Kelly Splinter MD [place] pager [phone]   07/03/2020, 4:08 PM

## 2020-07-03 NOTE — Progress Notes (Signed)
Physical Therapy Discharge Summary  Patient Details  Name: Christy Zhang MRN: [id number] Date of Birth: 1956-02-05  Today's Date: 07/03/2020 PT Individual Time:  -       Patient has met 5 of 8 long term goals due to improved activity tolerance, improved balance, improved postural control, increased strength, decreased pain, ability to compensate for deficits and improved coordination.  Patient to discharge at a wheelchair level Supervision.   Patient's care partner is independent to provide the necessary physical assistance at discharge.  Reasons goals not met: pt unable to participate in gait training per MD for maximal skin integrity of R foot. Pt also supervision for WC mobility with needs of VC for narrow turns and technique. Pt will have supervision at all times at home.  Recommendation:  Patient will benefit from ongoing skilled PT services in home health setting to continue to advance safe functional mobility, address ongoing impairments in WC mobility, standing balance, transfers, and minimize fall risk.  Equipment: slide board, hospital bed  Reasons for discharge: discharge from hospital  Patient/family agrees with progress made and goals achieved: Yes  PT Discharge Precautions/Restrictions Precautions Precautions: Fall Precaution Comments: falls, R 2nd toe wound Required Braces or Orthoses: Knee Immobilizer - Left Restrictions Weight Bearing Restrictions: Yes LLE Weight Bearing: Non weight bearing Vital Signs  Pain Pain Assessment Pain Scale: 0-10 Pain Score: 0-No pain Faces Pain Scale: No hurt Vision/Perception  Perception Perception: Within Functional Limits Praxis Praxis: Intact  Cognition Overall Cognitive Status: Within Functional Limits for tasks assessed Arousal/Alertness: Awake/alert Orientation Level: Oriented X4 Attention: Sustained Sustained Attention: Appears intact Awareness: Appears intact Problem Solving: Appears intact Executive  Function: Reasoning;Sequencing Reasoning: Appears intact Sequencing: Appears intact Safety/Judgment: Appears intact Sensation Sensation Light Touch: Appears Intact Hot/Cold: Appears Intact Proprioception: Impaired by gross assessment Stereognosis: Appears Intact Coordination Gross Motor Movements are Fluid and Coordinated: Yes Fine Motor Movements are Fluid and Coordinated: Yes 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 Motor  Motor Motor - Skilled Clinical Observations: generalized weakness Motor - Discharge Observations: improved overall strength  Mobility Bed Mobility Bed Mobility: Rolling Right;Rolling Left;Supine to Sit;Sit to Supine Rolling Right: Independent with assistive device Rolling Left: Independent with assistive device Supine to Sit: Independent with assistive device (intermittent use of hospital bed functions however pt is getting hospital bed for home) Sit to Supine: Independent with assistive device (intermittent use of hospital bed functions however pt is getting hospital bed for home) Transfers Transfers: Sit to Stand;Stand Pivot Transfers Sit to Stand: Minimal Assistance - Patient > 75% Stand Pivot Transfers: Minimal Assistance - Patient > 75% Stand Pivot Transfer Details: Verbal cues for technique;Verbal cues for precautions/safety;Verbal cues for safe use of DME/AE Transfer (Assistive device): Rolling walker Locomotion  Gait Ambulation: No Gait Assistance:  (per MD request for skin intregity of R foot/toes pt not to participate in gait training) Gait Gait: No Stairs / Additional Locomotion Stairs: No Ramp: Contact Guard/touching assist (completed in [facility]) Wheelchair Mobility Wheelchair Mobility: Yes Wheelchair Assistance: Chartered loss adjuster: Both upper extremities Wheelchair Parts Management: Supervision/cueing Distance: 200  Trunk/Postural Assessment  Cervical Assessment Cervical Assessment: Exceptions to [facility]  (mild forward head carriage) Thoracic Assessment Thoracic Assessment: Exceptions to [facility] (mildly kyphotic) Lumbar Assessment Lumbar Assessment: Exceptions to [facility] (posterior pelvic tilt) Postural Control Postural Control: Within Functional Limits  Balance Balance Balance Assessed: Yes Static Sitting Balance Static Sitting - Balance Support: No upper extremity supported;Feet supported (RLE supported) Static Sitting - Level of Assistance: 6: Modified independent (  Device/Increase time) Dynamic Sitting Balance Dynamic Sitting - Balance Support: Feet supported (RLE supported) Dynamic Sitting - Level of Assistance: 6: Modified independent (Device/Increase time) Dynamic Sitting - Balance Activities: Lateral lean/weight shifting;Reaching for weighted objects;Forward lean/weight shifting;Reaching across midline;Reaching for objects Static Standing Balance Static Standing - Balance Support: Bilateral upper extremity supported;During functional activity Static Standing - Level of Assistance: 4: Min assist Dynamic Standing Balance Dynamic Standing - Balance Support: Bilateral upper extremity supported;During functional activity Dynamic Standing - Level of Assistance: 4: Min assist Extremity Assessment      RLE Assessment RLE Assessment: Within Functional Limits General Strength Comments: grossly 5/5 LLE Assessment LLE Assessment: Within Functional Limits General Strength Comments: 4+/5 hip flexion, hip adduction, hip abduction    [name] 07/03/2020, 9:39 AM

## 2020-07-03 NOTE — Progress Notes (Signed)
Occupational Therapy Session Note  Patient Details  Name: Christy Zhang MRN: [id number] Date of Birth: [birth year]  Today's Date: 07/03/2020 OT Individual Time: 0732-0826 and [id number] OT Individual Time Calculation (min): 54 min and 40 min   Short Term Goals: Week 2:  OT Short Term Goal 1 (Week 2): Pt will complete LB dressing tasks with Min A using adaptive strategies + AE as needed OT Short Term Goal 2 (Week 2): Pt will complete toilet or BSC transfer with close supervision using LRAD   Skilled Therapeutic Interventions/Progress Updates:    Pt greeted at time of session semi reclined in bed agreeable to OT session, no pain and MD entered at this time as well making morning rounds. Supine > sit Supervision with bed rail and slide board to wheelchair Supervision after board placement, cues for anterior weight shift but good hand/foot placement noted. Set up at [facility] over toilet and scoot transfer with using wheelchair and BSC only to simulate home with Supervision. Doffed LB clothing Min A with lateral leans, pt had BM on commode and Supervision for sitting balance during hygiene for buttocks but Min A for clothing management to don over hips in partial stand. Note able to thread brief and skirt over LEs but required assist over hips, attempted lateral leans but unable with this technique alone. Demonstrated set up of BSC at home as well to have in a stable place against wall of some kind for safe toilet transfers as she does not think wheelchair will fit in bathroom. Again discussed that daughter feels comfortable assisting with ADLs and declined training. Set up at sink for oral hygiene Mod I from w/c. Re-wrapped residual limb with ace wrap for edema prevention/management, applied KI that had been drying since being washed yesterday, set up with alarm on call bell in reach. Also provided LHS for home use and demonstrated.   Session 2: Pt greeted at time of session sitting up in wheelchair  agreeable to OT sessoin, no pain. Had questions at beginning of session regarding DME, hospital bed, etc. And spoke with SW, hospital bed has been ordered and is going to reach out to [employer] regarding delivery. Relayed to pt. Pt self propel partially to gym, therapist taking over for energy conservation and time. Performed BUE there ex with green level 3 theraband for carryover at home to continue UB strengthening for scap squeezes, shoulder flexion/extension, and tricep extension, 1x5 of each with mirror for feedback for form. Reviewed where to purchase small dumbbells as well as needed at home if she prefers. Back in room with alarm on call bell in reach.   Therapy Documentation Precautions:  Precautions Precautions: Fall Precaution Comments: falls, R 2nd toe wound Required Braces or Orthoses: Knee Immobilizer - Left Knee Immobilizer - Left: Other (comment) Restrictions Weight Bearing Restrictions: Yes LLE Weight Bearing: Non weight bearing     Therapy/Group: Individual Therapy  Viona Gilmore 07/03/2020, 7:20 AM

## 2020-07-03 NOTE — Discharge Instructions (Signed)
Inpatient Rehab Discharge Instructions  [place] Discharge date and time:  07/03/20  Activities/Precautions/ Functional Status: Activity: no lifting, driving, or strenuous exercise till cleared by MD Diet: diabetic diet and renal diet--1200 cc/day Wound Care: Keep area clean and dry. Cover with dry dressing.    Functional status:  ___ No restrictions     ___ Walk up steps independently _X__ 24/7 supervision/assistance   ___ Walk up steps with assistance ___ Intermittent supervision/assistance  ___ Bathe/dress independently ___ Walk with walker     ___ Bathe/dress with assistance ___ Walk Independently    ___ Shower independently ___ Walk with assistance    ___ Shower with assistance ___ No alcohol     ___ Return to work/school ________  Special Instructions:    COMMUNITY REFERRALS UPON DISCHARGE:    Home Health:   PT, OT, RN                  Agency:[facility] Phone:[phone]   Medical Equipment/Items Ordered:HOSPITAL BED, HOYER SLING, 30 TRANSFR BOARD                                                 Agency/Supplier:[facility]  [phone]  RESUME [facility] SERVICES 30 HOURS PER WEEK VIA DAUGHTER   My questions have been answered and I understand these instructions. I will adhere to these goals and the provided educational materials after my discharge from the hospital.  Patient/Caregiver Signature _______________________________ Date __________  Clinician Signature _______________________________________ Date __________  Please bring this form and your medication list with you to all your follow-up doctor's appointments.     Carbohydrate Counting For People With Diabetes  Foods with carbohydrates make your blood glucose level go up. Learning how to count carbohydrates can help you control your blood glucose levels. First, identify the foods you eat that contain carbohydrates. Then, using the Foods with Carbohydrates chart, determine about how much  carbohydrates are in your meals and snacks. Make sure you are eating foods with fiber, protein, and healthy fat along with your carbohydrate foods. Foods with Carbohydrates The following table shows carbohydrate foods that have about 15 grams of carbohydrate each. Using measuring cups, spoons, or a food scale when you first begin learning about carbohydrate counting can help you learn about the portion sizes you typically eat. The following foods have 15 grams carbohydrate each:  Grains . 1 slice bread (1 ounce)  . 1 small tortilla (6-inch size)  .  large bagel (1 ounce)  . 1/3 cup pasta or rice (cooked)  .  hamburger or hot dog bun ( ounce)  .  cup cooked cereal  .  to  cup ready-to-eat cereal  . 2 taco shells (5-inch size) Fruit . 1 small fresh fruit ( to 1 cup)  .  medium banana  . 17 small grapes (3 ounces)  . 1 cup melon or berries  .  cup canned or frozen fruit  . 2 tablespoons dried fruit (blueberries, cherries, cranberries, raisins)  .  cup unsweetened fruit juice  Starchy Vegetables .  cup cooked beans, peas, corn, potatoes/sweet potatoes  .  large baked potato (3 ounces)  . 1 cup acorn or butternut squash  Snack Foods . 3 to 6 crackers  . 8 potato chips or 13 tortilla chips ( ounce to 1 ounce)  . 3  cups popped popcorn  Dairy . 3/4 cup (6 ounces) nonfat plain yogurt, or yogurt with sugar-free sweetener  . 1 cup milk  . 1 cup plain rice, soy, coconut or flavored almond milk Sweets and Desserts .  cup ice cream or frozen yogurt  . 1 tablespoon jam, jelly, pancake syrup, table sugar, or honey  . 2 tablespoons light pancake syrup  . 1 inch square of frosted cake or 2 inch square of unfrosted cake  . 2 small cookies (2/3 ounce each) or  large cookie  Sometimes you'll have to estimate carbohydrate amounts if you don't know the exact recipe. One cup of mixed foods like soups can have 1 to 2 carbohydrate servings, while some casseroles might have 2 or more servings  of carbohydrate. Foods that have less than 20 calories in each serving can be counted as "free" foods. Count 1 cup raw vegetables, or  cup cooked non-starchy vegetables as "free" foods. If you eat 3 or more servings at one meal, then count them as 1 carbohydrate serving.  Foods without Carbohydrates  Not all foods contain carbohydrates. Meat, some dairy, fats, non-starchy vegetables, and many beverages don't contain carbohydrate. So when you count carbohydrates, you can generally exclude chicken, pork, beef, fish, seafood, eggs, tofu, cheese, butter, sour cream, avocado, nuts, seeds, olives, mayonnaise, water, black coffee, unsweetened tea, and zero-calorie drinks. Vegetables with no or low carbohydrate include green beans, cauliflower, tomatoes, and onions. How much carbohydrate should I eat at each meal?  Carbohydrate counting can help you plan your meals and manage your weight. Following are some starting points for carbohydrate intake at each meal. Work with your registered dietitian nutritionist to find the best range that works for your blood glucose and weight.   To Lose Weight To Maintain Weight  Women 2 - 3 carb servings 3 - 4 carb servings  Men 3 - 4 carb servings 4 - 5 carb servings  Checking your blood glucose after meals will help you know if you need to adjust the timing, type, or number of carbohydrate servings in your meal plan. Achieve and keep a healthy body weight by balancing your food intake and physical activity.  Tips How should I plan my meals?  Plan for half the food on your plate to include non-starchy vegetables, like salad greens, broccoli, or carrots. Try to eat 3 to 5 servings of non-starchy vegetables every day. Have a protein food at each meal. Protein foods include chicken, fish, meat, eggs, or beans (note that beans contain carbohydrate). These two food groups (non-starchy vegetables and proteins) are low in carbohydrate. If you fill up your plate with these foods, you  will eat less carbohydrate but still fill up your stomach. Try to limit your carbohydrate portion to  of the plate.  What fats are healthiest to eat?  Diabetes increases risk for heart disease. To help protect your heart, eat more healthy fats, such as olive oil, nuts, and avocado. Eat less saturated fats like butter, cream, and high-fat meats, like bacon and sausage. Avoid trans fats, which are in all foods that list "partially hydrogenated oil" as an ingredient. What should I drink?  Choose drinks that are not sweetened with sugar. The healthiest choices are water, carbonated or seltzer waters, and tea and coffee without added sugars.  Sweet drinks will make your blood glucose go up very quickly. One serving of soda or energy drink is  cup. It is best to drink these beverages only if your  blood glucose is low.  Artificially sweetened, or diet drinks, typically do not increase your blood glucose if they have zero calories in them. Read labels of beverages, as some diet drinks do have carbohydrate and will raise your blood glucose. Label Reading Tips Read Nutrition Facts labels to find out how many grams of carbohydrate are in a food you want to eat. Don't forget: sometimes serving sizes on the label aren't the same as how much food you are going to eat, so you may need to calculate how much carbohydrate is in the food you are serving yourself.   Carbohydrate Counting for People with Diabetes Sample 1-Day Menu  Breakfast  cup yogurt, low fat, low sugar (1 carbohydrate serving)   cup cereal, ready-to-eat, unsweetened (1 carbohydrate serving)  1 cup strawberries (1 carbohydrate serving)   cup almonds ( carbohydrate serving)  Lunch 1, 5 ounce can chunk light tuna  2 ounces cheese, low fat cheddar  6 whole wheat crackers (1 carbohydrate serving)  1 small apple (1 carbohydrate servings)   cup carrots ( carbohydrate serving)   cup snap peas  1 cup 1% milk (1 carbohydrate serving)   Evening  Meal Stir fry made with: 3 ounces chicken  1 cup brown rice (3 carbohydrate servings)   cup broccoli ( carbohydrate serving)   cup green beans   cup onions  1 tablespoon olive oil  2 tablespoons teriyaki sauce ( carbohydrate serving)  Evening Snack 1 extra small banana (1 carbohydrate serving)  1 tablespoon peanut butter   Carbohydrate Counting for People with Diabetes Vegan Sample 1-Day Menu  Breakfast 1 cup cooked oatmeal (2 carbohydrate servings)   cup blueberries (1 carbohydrate serving)  2 tablespoons flaxseeds  1 cup soymilk fortified with calcium and vitamin D  1 cup coffee  Lunch 2 slices whole wheat bread (2 carbohydrate servings)   cup baked tofu   cup lettuce  2 slices tomato  2 slices avocado   cup baby carrots ( carbohydrate serving)  1 orange (1 carbohydrate serving)  1 cup soymilk fortified with calcium and vitamin D   Evening Meal Burrito made with: 1 6-inch corn tortilla (1 carbohydrate serving)  1 cup refried vegetarian beans (2 carbohydrate servings)   cup chopped tomatoes   cup lettuce   cup salsa  1/3 cup brown rice (1 carbohydrate serving)  1 tablespoon olive oil for rice   cup zucchini   Evening Snack 6 small whole grain crackers (1 carbohydrate serving)  2 apricots ( carbohydrate serving)   cup unsalted peanuts ( carbohydrate serving)    Carbohydrate Counting for People with Diabetes Vegetarian (Lacto-Ovo) Sample 1-Day Menu  Breakfast 1 cup cooked oatmeal (2 carbohydrate servings)   cup blueberries (1 carbohydrate serving)  2 tablespoons flaxseeds  1 egg  1 cup 1% milk (1 carbohydrate serving)  1 cup coffee  Lunch 2 slices whole wheat bread (2 carbohydrate servings)  2 ounces low-fat cheese   cup lettuce  2 slices tomato  2 slices avocado   cup baby carrots ( carbohydrate serving)  1 orange (1 carbohydrate serving)  1 cup unsweetened tea  Evening Meal Burrito made with: 1 6-inch corn tortilla (1 carbohydrate serving)    cup refried vegetarian beans (1 carbohydrate serving)   cup tomatoes   cup lettuce   cup salsa  1/3 cup brown rice (1 carbohydrate serving)  1 tablespoon olive oil for rice   cup zucchini  1 cup 1% milk (1 carbohydrate serving)  Evening Snack  6 small whole grain crackers (1 carbohydrate serving)  2 apricots ( carbohydrate serving)   cup unsalted peanuts ( carbohydrate serving)    Copyright 2020  Academy of [employer] Dietetics. All rights reserved.  Using Nutrition Labels: Carbohydrate  . Serving Size  . Look at the serving size. All the information on the label is based on this portion. Randol Kern Per Container  . The number of servings contained in the package. . Guidelines for Carbohydrate  . Look at the total grams of carbohydrate in the serving size.  . 1 carbohydrate choice = 15 grams of carbohydrate. Range of Carbohydrate Grams Per Choice  Carbohydrate Grams/Choice Carbohydrate Choices  6-10   11-20 1  21-25 1  26-35 2  36-40 2  41-50 3  51-55 3  56-65 4  66-70 4  71-80 5    Copyright 2020  Academy of [employer] and Dietetics. All rights reserved.     High-Potassium Foods List Food Amount Potassium (milligrams) Baked potato, with skin 1 medium 925 White beans, canned  cup 595 Avocado  fruit 487 Fish: halibut, tuna, cod, snapper 3 oz 480 Swiss chard  cup, cooked 480 Banana 1 medium 425 Spinach  cup cooked 420 Papaya 1 small 391 Milk: fat free, low fat, whole, buttermilk 1 cup (8 oz) 350-380 Lima beans  cup 353 Artichoke, cooked 1 medium 343 Soy milk 1 cup (8 oz) 287 Tomato or vegetable juice  cup (4 oz) 275 Dates 5 pieces 270 Raisins  cup (2 oz) 270 Potato, boiled  cup 255 Brussels sprouts  cup 250 [place] 3 oz 250 Sunflower or pumpkin seeds 1 oz 240 Yogurt  cup (4 oz) 238 Orange 1 fruit 237 Broccoli  cup 230 Cantaloupe  cup 215 Nuts: almonds, peanuts, hazelnuts, [place], cashew, mixed 1 oz 200 Tuna fish, canned 3 oz  200   Lower-Potassium Foods List The foods in the following table contain less than 100 milligrams of potassium:  Food Group Food Amount Grains  Bagel, 4": egg or plain  bagel Bread, white or whole wheat 1 slice English muffin 1 each Oatmeal, regular, quick, or instant, unenriched  cup Rice, brown, white, or wild  cup Spaghetti or macaroni, cooked  cup Tortilla, flour or corn 1 each Waffle, 4" 1 each  Protein Foods  Egg 1 large Hummus 1 tablespoon  Dairy and Dairy Alternatives Cheese 1 ounce  Vegetables  Cucumbers  cup Eggplant  cup Green beans  cup Peas, green, frozen  cup  Fruit  Applesauce  cup Blueberries  cup Cranberries  cup Cranberry juice cocktail  cup Nectar: papaya, mango, or pear  cup Raspberries  cup Watermelon  cup  Beverages  Tea, brewed  cup    Phosphorus Content of Foods Phosphorus is an important mineral that your body uses for energy and overall health.  What you eat and drink can affect the amount of phosphorus in your body.  The key to selecting foods with phosphorus is having the right balance for your needs.  Natural and Added Phosphorus Natural Phosphorus: Phosphorus occurs naturally in meats, dairy, grains, and vegetables.  Your body absorbs about half of this natural phosphorus from foods and drinks. Added Phosphorus: Phosphorus is also added to many foods and drinks as a preservative.  Your body absorbs nearly all of the added phosphorus from foods and drinks.  How Much Phosphorus is in Food and Drinks? Nutrition Facts labels don't typically include phosphorus amounts and they don't identify whether the phosphorus in  the product is natural or added. Read the ingredients list to check if a product label displays "phos" in the ingredients.  This abbreviation will indicate for sure that phosphorus has been added. Ingredients with phosphorus that are most commonly added to food include disodium phosphate, sodium  hexametaphosphate, phosphoric acid, calcium phosphate, and dipotassium phosphate. Foods and drinks with the highest added phosphorus are usually processed foods, packaged foods, and fast foods.  Phosphorus in Foods Low Phos options + High Phos options  Grains and Baked Goods Fresh breads, buns, dinner rolls, bagels English muffins, or pitas without "phos" ingredients Plain cereals such as oatmeal, corn flakes, rice crispies Reduced-salt crackers, rice cakes, pretzels, popcorn, or tortilla chips without "phos" ingredients  Processed breads and cereals with phosphorus additives on food label Biscuits, brownies, cakes, muffins, pancakes, pastries, or waffles that are ready-to-eat or made from a dry mix with "phos" on the label Refrigerated or frozen dough for biscuits, cookies, pastries, or sweet rolls with "phos" ingredients  Protein Foods  All-natural chicken, [place], fish or seafood Lean and fresh beef, lamb, pork, veal, or wild game (3 ounces is size of palm of hand) Whole eggs or egg whites (1 egg is 1 ounce) Tofu, beans, lentils, hummus (1/4-1/3 cup) Unsalted nuts (1/4 cup) or nut butters (1 tablespoon)  Processed meats like bacon, ham, hot dogs, chicken nuggets or strips, bologna, salami, or sausage Breaded or fried meats, chicken, fish or seafood Organ meats such as kidney or liver  Dairy and Dairy Alternatives Unfortified dairy alternates such as almond or rice beverages Milk or soy beverage (1/2 cup) Cottage cheese with no "phos" ingredients (1/2 cup) Yogurt (6 ounces) all natural, unsweetened, or plain preferred Natural cheese such as brie, feta, Swiss, cheddar, or mozzarella. Only have a small amount (1 ounce - size of your thumb or 2 dice) Regular or low-fat cream cheese, [place], or sour cream (1 tbsp) Sherbet, sorbet, fruit ice, or popsicles (1/2 cup)  Non-dairy creamers and some half and half creamers with "phos" Enriched dairy alternates such as almond, oat or  rice beverages Processed cheese, such as American Processed cheese spreads and dips Fat free cream cheese or sour cream Ice cream, pudding or frozen yogurt Milk-based or cheese-based soups or sauces  Vegetables  Fresh, frozen, or canned without added "phos" ingredients  Vegetables with sauces added Frozen or packaged potatoes or vegetables with "phos" ingredients  Fruits  Fresh, frozen or canned without added "phos" ingredients  Fresh, frozen or canned with added "phos" ingredients  Beverages  Water Fresh brewed coffee or tea Fresh lemonade or pure fruit juice (1/2 cup) Beverages without "phos" ingredients  Beverage or powdered mix with "phos" ingredients: most colas, energy and sports drinks, canned or bottled coffees and teas, flavored waters and drink mixes. Beers and wines  Other (Fast, convenience, or restaurant foods) Hamburgers, fish filet (no cheese), plain chicken wings Grilled, roasted, broiled, baked fish, chicken, [place], fish or seafood. Plain prepared eggs (no cheese, ham, bacon, sausage), [place] toast, English muffin, bagel, hot cereal, toast Pizza without meat or extra cheese Tacos, burritos, enchiladas fajitas with limited toppings.  Choose white rice, lettuce, sauted onions, bell peppers on the side Tuna or egg salad sandwich (no cheese) Sides: salad without cheese, coleslaw, apple slices, applesauce, grapes, or carrots Meals with no "phos" ingredients and have less than 600 milligrams of sodium per serving  Battered or fried fish or chicken including nuggets, sandwiches, strips or wings Pizza with meat and extra cheese Tacos, burritos,  enchiladas with meat and toppings such as cheese and beans Hot dogs and sausages Any sandwich made with ham, processed deli meats, American cheese, or bacon [place] fries or other fried potatoes or battered vegetables, biscuits, or macaroni and cheese Meals or soups with "phos" ingredients and more than 600 millligrams of  sodium per serving

## 2020-07-03 NOTE — Progress Notes (Signed)
Physical Therapy Session Note  Patient Details  Name: Christy Zhang MRN: [id number] Date of Birth: 06/19/1955  Today's Date: 07/03/2020 PT Individual Time: 0900-1011 PT Individual Time Calculation (min): 71 min   Short Term Goals: Week 1:  PT Short Term Goal 1 (Week 1): STG=LTG due to ELOS PT Short Term Goal 1 - Progress (Week 1): Not met Week 2:  PT Short Term Goal 1 (Week 2): STG=LTG due to ELOS  Skilled Therapeutic Interventions/Progress Updates:    pt received in [facility] and agreeable to therapy. Pt denied pain throughout session. Pt directed in [facility] mobility to ortho gym 100' supervision with VC for narrow turns and BUE propulsion technique. Pt then directed in car transfer to car simulation to car height for daughter's car, supervision into car and CGA out of car due to height difference. Pt then directed in seated BLE strengthening exercises with 2.5# on RLE and 2# on LLE for LAQ and marching on RLE and leg lifts on LLE and B hip abduction and adduction and glute squeezes 2x15 each. Pt had multiple questions about DC home with recommendations with car transfers, PT explained recommendation of slide board transfers for cars as able for safety, she is agreeable to this and that she can support LLE with KI in place until MD has cleared for new orders and support at rest with pillow, this was demonstrated. Pt then reported no additional concerns about DC home, daughter will be home for all needs at all times and has completed PT family education during pt's admission. Pt then directed in [facility] mobility back to room, supervision 150' total no rest breaks and limited VC. Pt then directed in armrest removal and B leg rest removal and replacement on WC with VC needed but not manual assistance. Pt left in WC, All needs in reach and in good condition. Call light in hand.  And alarm set.    Therapy Documentation Precautions:  Precautions Precautions: Fall Precaution Comments: falls, R 2nd toe  wound Required Braces or Orthoses: Knee Immobilizer - Left Knee Immobilizer - Left: Other (comment) Restrictions Weight Bearing Restrictions: Yes LLE Weight Bearing: Non weight bearing General:   Vital Signs:  Pain:  Mobility: Bed Mobility Bed Mobility: Rolling Right;Rolling Left;Supine to Sit;Sit to Supine Rolling Right: Independent with assistive device Rolling Left: Independent with assistive device Supine to Sit: Independent with assistive device (intermittent use of hospital bed functions however pt is getting hospital bed for home) Sit to Supine: Independent with assistive device (intermittent use of hospital bed functions however pt is getting hospital bed for home) Transfers Transfers: Sit to Stand;Stand Pivot Transfers Sit to Stand: Minimal Assistance - Patient > 75% Stand Pivot Transfers: Minimal Assistance - Patient > 75% Stand Pivot Transfer Details: Verbal cues for technique;Verbal cues for precautions/safety;Verbal cues for safe use of DME/AE Transfer (Assistive device): Rolling walker Locomotion : Gait Ambulation: No Gait Assistance:  (per MD request for skin intregity of R foot/toes pt not to participate in gait training) Gait Gait: No Stairs / Additional Locomotion Stairs: No Ramp: Contact Guard/touching assist (completed in [facility]) Wheelchair Mobility Wheelchair Mobility: Yes Wheelchair Assistance: Chartered loss adjuster: Both upper extremities Wheelchair Parts Management: Supervision/cueing Distance: 200  Trunk/Postural Assessment : Cervical Assessment Cervical Assessment: Exceptions to [facility] (mild forward head carriage) Thoracic Assessment Thoracic Assessment: Exceptions to [facility] (mildly kyphotic) Lumbar Assessment Lumbar Assessment: Exceptions to [facility] (posterior pelvic tilt) Postural Control Postural Control: Within Functional Limits  Balance: Balance Balance Assessed: Yes Static Sitting Balance  Static Sitting - Balance  Support: No upper extremity supported;Feet supported (RLE supported) Static Sitting - Level of Assistance: 6: Modified independent (Device/Increase time) Dynamic Sitting Balance Dynamic Sitting - Balance Support: Feet supported (RLE supported) Dynamic Sitting - Level of Assistance: 6: Modified independent (Device/Increase time) Dynamic Sitting - Balance Activities: Lateral lean/weight shifting;Reaching for weighted objects;Forward lean/weight shifting;Reaching across midline;Reaching for objects Static Standing Balance Static Standing - Balance Support: Bilateral upper extremity supported;During functional activity Static Standing - Level of Assistance: 4: Min assist Dynamic Standing Balance Dynamic Standing - Balance Support: Bilateral upper extremity supported;During functional activity Dynamic Standing - Level of Assistance: 4: Min assist Exercises:   Other Treatments:      Therapy/Group: Individual Therapy  [name] 07/03/2020, 12:53 PM

## 2020-07-03 NOTE — Progress Notes (Signed)
Brief Nutrition Note  RD was contacted by Physical Therapy regarding pt's request for diet education handouts. Pt previously seen for diet education and handouts provided on 2/23. RD met with pt and provided additional copies of diet education handouts. All questions answered. Pt appreciative of RD visit.   Gustavus Bryant, MS, RD, LDN Inpatient Clinical Dietitian Please see [facility] for contact information.

## 2020-07-03 NOTE — Progress Notes (Signed)
Pt return to 4M07. Dinner tray ordered. Vitals obtained. CBG obtained.  Sheela Stack, LPN

## 2020-07-03 NOTE — Progress Notes (Signed)
Occupational Therapy Discharge Summary  Patient Details  Name: Christy Zhang MRN: [id number] Date of Birth: 23-Nov-1955   Patient has met 9 of 10 long term goals due to improved activity tolerance, improved balance, postural control, ability to compensate for deficits, improved awareness and improved coordination.  Patient to discharge at [facility] Assist level.  Patient's care partner is independent to provide the necessary physical assistance at discharge.  Pt is Min A with LB dressing, bathing, and toileting tasks for clothing management. Pt is Supervision for lateral scoot transfers to/from commode/BSC, needing assist for clothing but able to perform hygiene with leans. Pt also performing slide board transfers for RLE skin integrity and preservation with close supervision. Education completed with daughter verbally, however declined hands on training stating she felt comfortable attending to ADLs at home.   Reasons goals not met: Pt is Min A for sit > stand and did not meet goal for CGA.   Recommendation:  Patient will benefit from ongoing skilled OT services in home health setting to continue to advance functional skills in the area of BADL and Reduce care partner burden.  Equipment: recommended drop arm BSC but not covered by insurance at this time. Pt aware of where to purchase is desired.   Reasons for discharge: treatment goals met and discharge from hospital  Patient/family agrees with progress made and goals achieved: Yes  OT Discharge Precautions/Restrictions  Precautions Precautions: Fall Precaution Comments: falls, R 2nd toe wound Required Braces or Orthoses: Knee Immobilizer - Left Restrictions Weight Bearing Restrictions: Yes LLE Weight Bearing: Non weight bearing Pain Pain Assessment Pain Scale: 0-10 Pain Score: 0-No pain Faces Pain Scale: No hurt Pain Location: Leg Pain Orientation: Left Pain Intervention(s): Medication (See eMAR) ADL ADL Equipment  Provided: Long-handled sponge Eating: Independent Where Assessed-Eating: Wheelchair Grooming: Setup Where Assessed-Grooming: Wheelchair Upper Body Bathing: Setup Where Assessed-Upper Body Bathing: Wheelchair Lower Body Bathing: Minimal assistance Where Assessed-Lower Body Bathing: Wheelchair Upper Body Dressing: Supervision/safety Where Assessed-Upper Body Dressing: Wheelchair Lower Body Dressing: Minimal assistance Where Assessed-Lower Body Dressing: Wheelchair Toileting: Minimal assistance Where Assessed-Toileting: Bedside Commode Toilet Transfer: Close supervision Toilet Transfer Method: Arts development officer: Radiographer, therapeutic: Not assessed Social research officer, government: Not assessed (Does not shower at baseline) Vision Baseline Vision/History: Wears glasses Wears Glasses: Reading only Patient Visual Report: No change from baseline Vision Assessment?: No apparent visual deficits Perception  Perception: Within Functional Limits Praxis Praxis: Intact Cognition Overall Cognitive Status: Within Functional Limits for tasks assessed Arousal/Alertness: Awake/alert Orientation Level: Oriented X4 Attention: Sustained Sustained Attention: Appears intact Memory: Appears intact Awareness: Appears intact Problem Solving: Appears intact Executive Function: Reasoning;Sequencing Reasoning: Appears intact Sequencing: Appears intact Safety/Judgment: Appears intact Sensation Sensation Light Touch: Appears Intact Hot/Cold: Appears Intact Proprioception: Impaired by gross assessment Stereognosis: Appears Intact Coordination Gross Motor Movements are Fluid and Coordinated: Yes Fine Motor Movements are Fluid and Coordinated: Yes 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 Motor  Motor Motor: Other (comment) Motor - Skilled Clinical Observations: generalized weakness Motor - Discharge Observations: improved overall strength but limited at times d/t  L BKA Mobility  Bed Mobility Bed Mobility: Rolling Right;Rolling Left;Supine to Sit;Sit to Supine Rolling Right: Independent with assistive device Rolling Left: Independent with assistive device Supine to Sit: Independent with assistive device Sit to Supine: Independent with assistive device (intermittent use of hospital bed functions however pt is getting hospital bed for home) Transfers Sit to Stand: Minimal Assistance - Patient > 75%  Trunk/Postural Assessment  Cervical  Assessment Cervical Assessment: Exceptions to [facility] Thoracic Assessment Thoracic Assessment: Exceptions to [facility] Lumbar Assessment Lumbar Assessment: Exceptions to [facility] Behavioral Postural Control Postural Control: Within Functional Limits  Balance Balance Balance Assessed: Yes Static Sitting Balance Static Sitting - Balance Support: No upper extremity supported;Feet supported Static Sitting - Level of Assistance: 6: Modified independent (Device/Increase time) Dynamic Sitting Balance Dynamic Sitting - Balance Support: Feet supported Dynamic Sitting - Level of Assistance: 6: Modified independent (Device/Increase time) Dynamic Sitting - Balance Activities: Lateral lean/weight shifting;Forward lean/weight shifting;Reaching across midline;Reaching for objects Static Standing Balance Static Standing - Balance Support: Bilateral upper extremity supported;During functional activity Static Standing - Level of Assistance: 4: Min assist Dynamic Standing Balance Dynamic Standing - Balance Support: Bilateral upper extremity supported;During functional activity Dynamic Standing - Level of Assistance: 4: Min assist Extremity/Trunk Assessment RUE Assessment RUE Assessment: Within Functional Limits LUE Assessment LUE Assessment: Within Functional Limits   Viona Gilmore 07/03/2020, 1:00 PM

## 2020-07-04 LAB — GLUCOSE, CAPILLARY
Glucose-Capillary: 103 mg/dL — ABNORMAL HIGH (ref 70–99)
Glucose-Capillary: 95 mg/dL (ref 70–99)

## 2020-07-04 MED ORDER — PROSOURCE PLUS PO LIQD: 30.0000 mL | Freq: Two times a day (BID) | ORAL | 0 refills | Status: DC

## 2020-07-04 MED ORDER — METOPROLOL SUCCINATE ER 50 MG PO TB24: 50.0000 mg | ORAL_TABLET | Freq: Every day | ORAL | 0 refills | Status: DC

## 2020-07-04 MED ORDER — ACETAMINOPHEN 325 MG PO TABS: 325.0000 mg | ORAL_TABLET | ORAL | Status: AC | PRN

## 2020-07-04 MED ORDER — DICLOFENAC SODIUM 1 % EX GEL: 2.0000 g | Freq: Four times a day (QID) | CUTANEOUS | 0 refills | Status: DC

## 2020-07-04 MED ORDER — HYDROCODONE-ACETAMINOPHEN 5-325 MG PO TABS: 1.0000 | ORAL_TABLET | Freq: Two times a day (BID) | ORAL | 0 refills | Status: DC | PRN

## 2020-07-04 MED ORDER — GABAPENTIN 300 MG PO CAPS: 300.0000 mg | ORAL_CAPSULE | Freq: Every day | ORAL | 0 refills | Status: DC

## 2020-07-04 MED ORDER — INSULIN GLARGINE (1 UNIT DIAL) 300 UNIT/ML ~~LOC~~ SOPN: 16.0000 [IU] | PEN_INJECTOR | Freq: Every day | SUBCUTANEOUS | Status: DC

## 2020-07-04 MED ORDER — SENNOSIDES-DOCUSATE SODIUM 8.6-50 MG PO TABS
1.0000 | ORAL_TABLET | Freq: Two times a day (BID) | ORAL | 0 refills | Status: AC
Start: 2020-07-04 — End: ?

## 2020-07-04 MED ORDER — TRAMADOL HCL 50 MG PO TABS: 50.0000 mg | ORAL_TABLET | Freq: Two times a day (BID) | ORAL | 0 refills | Status: DC | PRN

## 2020-07-04 MED ORDER — AMLODIPINE BESYLATE 5 MG PO TABS: 5.0000 mg | ORAL_TABLET | Freq: Every day | ORAL | 0 refills | Status: DC

## 2020-07-04 NOTE — Discharge Summary (Signed)
Physician Discharge Summary  Patient ID: Christy Zhang MRN: [id number] DOB/AGE: 63-Jul-1957 63 y.o.  Admit date: 06/16/2020 Discharge date: 07/04/2020  Discharge Diagnoses:  Principal Problem:   Below-knee amputation of left lower extremity ([place]) Active Problems:   ESRD (end stage renal disease) ([place])   Anemia in chronic kidney disease   Essential (primary) hypertension   Leucocytosis   Discharged Condition: stable   Significant Diagnostic Studies: DG Chest 2 View  Result Date: 06/22/2020 CLINICAL DATA:  Elevated WBC count. No chest complaints. Hx of CHF, asthma, CKD, ESRD, AND HTN. Pt is a former smoker. EXAM: CHEST - 2 VIEW COMPARISON:  Chest radiograph 08/25/2014 FINDINGS: Vascular graft noted in the right upper chest. Heart size is enlarged. The lungs are clear. No pneumothorax or pleural effusion. Degenerative changes are noted in the thoracic spine. IMPRESSION: Cardiomegaly.  No evidence of active disease. Electronically Signed   By: Audie Pinto M.D.   On: 06/22/2020 16:59    Labs:  Basic Metabolic Panel: Recent Labs  Lab 06/28/20 1407 06/30/20 1407 07/03/20 0620  NA 127* 127* 127*  K 5.0 5.1 4.9  CL 92* 92* 92*  CO2 23 21* 21*  GLUCOSE 104* 143* 69*  BUN 57* 61* 64*  CREATININE 8.68* 9.60* 10.64*  CALCIUM 8.6* 9.0 9.1  PHOS 3.9 5.5* 5.7*    CBC: Recent Labs  Lab 06/28/20 1407 06/30/20 2054 07/03/20 0620  WBC 14.1* 14.7* 16.0*  NEUTROABS  --   --  11.5*  HGB 7.5* 7.8* 8.3*  HCT 24.3* 25.5* 27.0*  MCV 84.4 83.3 83.1  PLT 575* 574* 666*    CBG: Recent Labs  Lab 07/03/20 1141 07/03/20 1721 07/03/20 2100 07/04/20 0620 07/04/20 1134  GLUCAP 104* 85 105* 103* 95    Brief HPI:   Christy Zhang is a 65 y.o. female with history of ESRD, T2DM, HTN, morbid obesity, PVD with gangrenous changes left foot and multiple attempts at limb salvage.  She was admitted on 06/08/2018 for left BKA by Dr. Stanford Breed.  Postop course significant for issues with  pain control as well as leukocytosis with rise in WBC still 24.2.  Blood cultures x2 were negative.  She was maintained on preop cefazolin thorough 02/13 and metoprolol resumed as bradycardia resolved.  Therapy was initiated and was ongoing.  Patient was limited by fatigue, left BKA and balance deficits affecting overall mobility and ADLs.  CIR was recommended due to functional decline.   Hospital Course: Christy Zhang was admitted to rehab 06/16/2020 for inpatient therapies to consist of PT and OT at least three hours five days a week. Past admission physiatrist, therapy team and rehab RN have worked together to provide customized collaborative inpatient rehab.  She was maintained on aspirin Plavix during her stay and is tolerating this without side effects.  Anemia of chronic disease has been monitored with Aranesp for supplementation.  She has had issues with phantom pain and was started on gabapentin 300 mg at bedtime with Norco as needed for pain management.  Left BKA site is healing well with staples in sutures intact.  She has had persistent leukocytosis with upward trend.  Blood cultures x2 and chest x-ray was ordered for work-up and was negative.  Recommend repeat CBC in 1 to 2 weeks for monitoring..  Elevation could be from dry gangrene however if still present after autoamputation would recommend referral to hematology for further work-up.  Hyponatremia hypokalemia has been managed with hemodialysis.  Ambulation is limited due to concerns  of autoamputation.  P.o. intake has been good.  Blood sugars were monitored with ac/hs CBG checks and SSI was use prn for tighter BS control.  She has had issues with hypoglycemia therefore Lantus was decreased to 16 units daily.  Patient was advised to monitor blood sugars on ac/hs basis and is to follow-up with PCP for further titration as indicated.   She has made good progress during her rehab stay and currently requires min assist at wheelchair level.  She  will continue to receive further follow-up home health PT, OT and RN from [facility] after discharge.   Rehab course: During patient's stay in rehab weekly team conferences were held to monitor patient's progress, set goals and discuss barriers to discharge. At admission, patient required mod assist with mobility and self care tasks. She  has had improvement in activity tolerance, balance, postural control as well as ability to compensate for deficits. She requires min assist to complete ADL tasks. She requires min assist for SPT and is non ambulatory at this time. She is able to propel her wheelchair for 150' and requires SB with supervision for car transfers. Daughter (her CAPS caregiver) declined hand on family education and will provide assistance after discharge.   Discharge disposition: 01-Home or Self Care  Diet: Renal/carb modified  Special Instructions: 1.  Recommend repeat CBC in 2 weeks to monitor white count.  If still elevated after autoamputation recommend referral to heme-onc for further work-up. 2.  Continue to monitor blood sugars before meals and at bedtime and follow-up with PCP for further adjustment.  Allergies as of 07/04/2020      Reactions   Sulfa Antibiotics Swelling      Medication List    STOP taking these medications   insulin glargine 100 UNIT/ML injection Commonly known as: LANTUS Replaced by: insulin glargine (1 Unit Dial) 300 UNIT/ML Solostar Pen   oxyCODONE-acetaminophen 5-325 MG tablet Commonly known as: Percocet     TAKE these medications   (feeding supplement) PROSource Plus liquid Take 30 mLs by mouth 2 (two) times daily between meals.   acetaminophen 325 MG tablet Commonly known as: TYLENOL Take 1-2 tablets (325-650 mg total) by mouth every 4 (four) hours as needed for mild pain. What changed:   how much to take  when to take this  reasons to take this   amLODipine 5 MG tablet Commonly known as: NORVASC Take 1 tablet (5 mg  total) by mouth at bedtime. What changed:   medication strength  how much to take   aspirin EC 81 MG tablet Take 81 mg by mouth at bedtime. Swallow whole.   atorvastatin 40 MG tablet Commonly known as: LIPITOR Take 1 tablet (40 mg total) by mouth daily. What changed: when to take this   clopidogrel 75 MG tablet Commonly known as: Plavix Take 1 tablet (75 mg total) by mouth daily.   diclofenac Sodium 1 % Gel Commonly known as: VOLTAREN Apply 2 g topically 4 (four) times daily.   diphenhydrAMINE 25 MG tablet Commonly known as: BENADRYL Take 25 mg by mouth every 6 (six) hours as needed for itching.   gabapentin 300 MG capsule Commonly known as: NEURONTIN Take 1 capsule (300 mg total) by mouth at bedtime.   HYDROcodone-acetaminophen 5-325 MG tablet--Rx# 14 pills Commonly known as: NORCO/VICODIN Take 1 tablet by mouth every 12 (twelve) hours as needed for severe pain.   insulin glargine (1 Unit Dial) 300 UNIT/ML Solostar Pen Commonly known as: TOUJEO Inject 16  Units into the skin daily. Replaces: insulin glargine 100 UNIT/ML injection Notes to patient: Monitor blood sugars before meals and at bedtime and follow up with PCP for adjustment in insulin.    metoprolol succinate 50 MG 24 hr tablet Commonly known as: TOPROL-XL Take 1 tablet (50 mg total) by mouth daily. Take with or immediately following a meal. What changed:   medication strength  how much to take  additional instructions   multivitamin Tabs tablet Take 1 tablet by mouth at bedtime.   polyethylene glycol 17 g packet Commonly known as: MIRALAX / GLYCOLAX Take 17 g by mouth daily.   senna-docusate 8.6-50 MG tablet Commonly known as: Senokot-S Take 1 tablet by mouth 2 (two) times daily.   sevelamer carbonate 800 MG tablet Commonly known as: RENVELA Take 1,600-2,400 mg by mouth See admin instructions. Take 2400 mg with each meal and 1600 mg with each snack   traMADol 50 MG tablet--Rx# 14  pills Commonly known as: ULTRAM Take 1 tablet (50 mg total) by mouth every 12 (twelve) hours as needed for moderate pain.   Trulicity 1.5 0000000 Sopn Generic drug: Dulaglutide Inject 1.5 mg into the skin once a week. Saturday       Follow-up Information    Lovorn, Jinny Blossom, MD Follow up.   Specialty: Physical Medicine and Rehabilitation Why: Office will call you with follow up appointment Contact information: [id number] [place] [address] [phone]        Claretta Fraise, MD Follow up on 07/06/2020.   Specialty: Family Medicine Why: Appointment at 9:55 am for post hospita follow up Contact information: [place] [address] [phone]        Cherre Robins, MD Follow up on 07/11/2020.   Specialties: Vascular Surgery, Interventional Cardiology Why: Appointment at 2:30pm Contact information: [place] [address] [phone]               Signed: Bary Leriche 07/05/2020, 11:10 AM

## 2020-07-04 NOTE — Progress Notes (Signed)
Inpatient Rehabilitation Care Coordinator Discharge Note  The overall goal for the admission was met for:   Discharge location: Yes-HOME WITH DAUGHTER WHO IS HER CAREGIVER  Length of Stay: Yes-18 DAYS  Discharge activity level: Yes-SUPERVISION-CGA LEVEL  Home/community participation: Yes  Services provided included: MD, RD, PT, OT, RN, CM, Pharmacy, Neuropsych and SW  Financial Services: Medicare and Medicaid  Choices offered to/list presented to:YES  Follow-up services arranged: Home Health: [facility]-PT,OT,RN, DME: [facility]  AND [employer] P[facility] FOR WHEELCHAIR, Other: PCS-DAUGHTER IS HER PCS WORKER RESUME and Patient/Family request agency HH: [facility], DME: NO PREF  Comments (or additional information):MARCIA WAS IN FOR EDUCATION AND CAR TRANSFERS WENT WELL AND BOTH COMFORTABLE WITH PT'S CARE NEEDS. DAUGHTER TRANSPORTS TO HD  Patient/Family verbalized understanding of follow-up arrangements: Yes  Individual responsible for coordination of the follow-up plan: [facility] [phone]  Confirmed correct DME delivered: Elease Hashimoto 07/04/2020    [place], Gardiner Rhyme

## 2020-07-04 NOTE — Progress Notes (Signed)
This nurse in to discuss discharge instructions. PA Pam followed up. Pt in agreement. Belongings gathered. Pt left per wheelchair to private vehicle. No complications noted.  Sheela Stack, LPN

## 2020-07-04 NOTE — Patient Care Conference (Incomplete)
Inpatient RehabilitationTeam Conference and Plan of Care Update Date: 07/04/2020   Time: 1:56 AM    Patient Name: Christy Zhang      Medical Record Number: [id number]  Date of Birth: 1956/02/23 Sex: Female         Room/Bed: 4M07C/4M07C-01 Payor Info: Payor: MEDICARE / Plan: MEDICARE PART A AND B / Product Type: *No Product type* /    Admit Date/Time:  06/16/2020  3:32 PM  Primary Diagnosis:  Below-knee amputation of left lower extremity [facility])  Hospital Problems: Principal Problem:   Below-knee amputation of left lower extremity [facility])    Expected Discharge Date: Expected Discharge Date: 07/04/20  Team Members Present:       Current Status/Progress Goal Weekly Team Focus  Bowel/Bladder             Swallow/Nutrition/ Hydration             ADL's             Mobility             Communication             Safety/Cognition/ Behavioral Observations            Pain             Skin               Discharge Planning:      Team Discussion: *** Patient on target to meet rehab goals: {IP REHAB YES/NO WITH RX:[id number]  *See Care Plan and progress notes for long and short-term goals.   Revisions to Treatment Plan:  ***  Teaching Needs: ***  Current Barriers to Discharge: {BARRIERS TO [id number]  Possible Resolutions to Barriers: ***     Medical Summary               I attest that I was present, lead the team conference, and concur with the assessment and plan of the team.   Felix Pacini 07/04/2020, 1:56 AM

## 2020-07-04 NOTE — Progress Notes (Signed)
PROGRESS NOTE   Subjective/Complaints:  Pt reports ready to go home.  Said bed and w/c parts weren't delivered- spoke to SW_ will make sure delivered today.  Won't delay d/c.   ROS:    Pt denies SOB, abd pain, CP, N/V/C/D, and vision changes   Objective:   No results found. Recent Labs    07/03/20 0620  WBC 16.0*  HGB 8.3*  HCT 27.0*  PLT 666*   Recent Labs    07/03/20 0620  NA 127*  K 4.9  CL 92*  CO2 21*  GLUCOSE 69*  BUN 64*  CREATININE 10.64*  CALCIUM 9.1    Intake/Output Summary (Last 24 hours) at 07/04/2020 0906 Last data filed at 07/04/2020 0800 Gross per 24 hour  Intake 120 ml  Output 2500 ml  Net -2380 ml        Physical Exam: Vital Signs Blood pressure (!) 152/41, pulse 78, temperature 98.7 F (37.1 C), resp. rate 18, height '5\' 3"'$  (1.6 m), weight 101.1 kg, SpO2 95 %.     General: awake, alert, appropriate, NAD- sitting up in bed eating breakfast HENT: conjugate gaze; oropharynx moist CV: regular rate; no JVD Pulmonary: CTA B/L; no W/R/R- good air movement GI: soft, NT, ND, (+)BS- protuberant Psychiatric: appropriate- but a little frustrated over bed situation Neurological: Ox3 Skin: intact except LEs- L BKA and R toes- gangrene- R 3rd toe about to autoamputate- still hasn't. No change  Musculoskeletal:No UE pain with ROM 5/5 in BUE 4/5 RLE 4/5 L hip flex Sensation absent below RIght ankle Ext CHarcot foot deformity on RIght     Assessment/Plan: 1. Functional deficits which require 3+ hours per day of interdisciplinary therapy in a comprehensive inpatient rehab setting.  Physiatrist is providing close team supervision and 24 hour management of active medical problems listed below.  Physiatrist and rehab team continue to assess barriers to discharge/monitor patient progress toward functional and medical goals  Care Tool:  Bathing    Body parts bathed by patient: Face,Right  arm,Chest,Left arm,Abdomen,Right upper leg,Front perineal area,Left upper leg,Buttocks   Body parts bathed by helper: Right lower leg Body parts n/a: Left lower leg   Bathing assist Assist Level: Minimal Assistance - Patient > 75%     Upper Body Dressing/Undressing Upper body dressing   What is the patient wearing?: Pull over shirt    Upper body assist Assist Level: Set up assist    Lower Body Dressing/Undressing Lower body dressing      What is the patient wearing?: Underwear/pull up,Skirt     Lower body assist Assist for lower body dressing: Minimal Assistance - Patient > 75%     Toileting Toileting    Toileting assist Assist for toileting: Minimal Assistance - Patient > 75%     Transfers Chair/bed transfer  Transfers assist     Chair/bed transfer assist level: Supervision/Verbal cueing Chair/bed transfer assistive device: Sliding board   Locomotion Ambulation   Ambulation assist   Ambulation activity did not occur: Safety/medical concerns (pt unable to safely complete gait training per MD 2/2 poor skin integrity on RLE)  Assist level: Contact Guard/Touching assist Assistive device: Parallel bars Max distance: 8'  Walk 10 feet activity   Assist  Walk 10 feet activity did not occur: Safety/medical concerns        Walk 50 feet activity   Assist Walk 50 feet with 2 turns activity did not occur: Safety/medical concerns         Walk 150 feet activity   Assist Walk 150 feet activity did not occur: Safety/medical concerns         Walk 10 feet on uneven surface  activity   Assist Walk 10 feet on uneven surfaces activity did not occur: Safety/medical concerns         Wheelchair     Assist Will patient use wheelchair at discharge?: Yes Type of Wheelchair: Manual    Wheelchair assist level: Supervision/Verbal cueing Max wheelchair distance: 200    Wheelchair 50 feet with 2 turns activity    Assist        Assist  Level: Supervision/Verbal cueing   Wheelchair 150 feet activity     Assist      Assist Level: Supervision/Verbal cueing   Blood pressure (!) 152/41, pulse 78, temperature 98.7 F (37.1 C), resp. rate 18, height '5\' 3"'$  (1.6 m), weight 101.1 kg, SpO2 95 %.  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 -patient may showerif residual limb is water-proofed -ELOS/Goals: 7-10 days, Mod I at w/c level  -Continue CIR PT, OT 2. Antithrombotics: -DVT/anticoagulation:Pharmaceutical:Continue Heparin -antiplatelet therapy: ASA/Plavix 3. Pain Management:ContinueOxycodone prn--received 2 doses 2/20  2/21- having more phantom pain than anything- on no phantom pain meds- will start Gabapentin 300 mg QHS for now- since on HD- max dose we can usually use.   2/22- pain better today- con't regimen  2/24- pt reports some early AM hallucinations- will change to Norco for pain- if doesn't improve, will need to decrease Gabapentin dose  2/25- doesn't report any hallucinations and Norco working well for her- for pain control- con't regimen  3/7- Purcell working well- taking occ, not frequent- con't regimen  3/8- will send home on Norco prn and Gabapentin 300 mg QHS 4. Mood:LCSW to follow for evaluation and support. -antipsychotic agents: N/A 5. Neuropsych: This patientiscapable of making decisions on herown behalf. 6. Skin/Wound Care:Protein supplement for hypoalbuminemia  --monitor wound for healing.  -apply dry dressing and ACE to residual limb -apply small amount of gauze to protect gangrenous second/third toe which is about to auto-amputate   2/22- L BKA looking better- healing; stable in RLE/toes- doesn't appear to be source of Leukocytosis  3/1- KI was on wrong per nursing- didn't get new one- put old one on correctly.    3/3- KI fitting well this AM- con't regimen-  3/7- no change- con't wound care  3/8- explained to pt about auto amputation process for R toes- would see PCP about this when occurs.  7. Fluids/Electrolytes/Nutrition:Strict I/O. Labs with HD. 1200cc/FR. 8. ESRD: Schedule HD at the end of the day on MWF to help with tolerance of therapy.  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   -Cr increased to 11.13 on 2/19- appreciate management by nephrology 9. T2DM with nephropathy: Hgb A1c- 6.4 on last check. Was on Lantus 123XX123 units with Trulicity 1.5 mg/wk.  --Monitor BS ac/hs and use SSI for elevated BS --Continue to titrate Lantus to home dose as indicated   2/27- BGs 95-159- doing great- con't regimen  3/2- BGs great control- con't regimen  3/4- BGs 70-142- will decrease  meds secondary to low BG- reduced Lantus to 18 units  3/7- BGs 69-113- will reduce Lantus to 16 units.   3/8- BGs 85-105- well controlled, but no lows- con't regimen 10 HTN: Monitor BP tid. Has been labile --Continue Metoprolol and amlodipine.  2/25- BP 120s/60s-70s- well controlled- con't meds per Renal  3/8- usually great control- but up a little- might be stressed over bed issues- con't regimen Vitals:   07/03/20 1952 07/04/20 0449  BP: (!) 142/48 (!) 152/41  Pulse: 75 78  Resp: 16 18  Temp: 98.9 F (37.2 C) 98.7 F (37.1 C)  SpO2: 96% 95%   11. PVD: Continue Plavix, ASA and Lipitor 12. Morbid obesity: BMI 39.01. --educate on diet as well as importance of weight loss to help promote health and mobility.   3/1- BMI up to 40.38- likely weight gain as compared to fluid- esp AFTER BKA, will con't counseling.  13. Anemia of chronic disease: Being managed with weekly Aranesp.  14. Thrombocytosis: Continue to trend--improved from peak of 774-->600  2/26- Plts 612k- stable- con't to monitor  15. Leukocytosis: discussed with patient  that WBC is elevated. She is asymptomatic and without fever, repeat with next dialysis labs to trend  2/21- WBC up to 15k-will check U/A and Cx since pt is oliguric, not anuric- most likely cause vs her dry gangrene of R foot/toes- will reassess in Am and recheck CBC/renal fxn panel in AM- even though it's not at HD, need the results.   2/22- WBC down to 13.3k - U/A and Cx is pending. D/w nursing  2/24- WBC went up to 15.6 yesterday- back down to 13k today/this AM- will do additional ID w/u including blood Cx's.  2/25- Blood Cx's pending; Urine Cx no growth- WBC 14.9 today- asymptomatic- pt says she remembers that she usually has elevated WBC for an unknown reason. If blood C'xs negative, will ask her PCP to address after d/c.    2/27- no growth x 2 days so far- will con't to monitor  2/28: WBC stable, continue to monitor.  3/3- WBC still 14k, but so far, entire ID work up (-)- will have pt f/u outpt- could be from autoamputation   3/7- WBC slightly more at 16k- no change in skin- con't to monitor-    16. Hyperkalemia/Hyponatremia  2/21- will d/w renal- NA down to 126 and K+ up to 5.6.   2/22- K+ down to 4.0 and Na up to 130- con't per renal  2/24- NA 129- up from 124 and K+ is 4.3- per renal.   2/25- Na 126 today- per renal- thank you for assistance.   3/1- Na 125- per renal with HD.  3/4- labs Monday  3/7- Na 127- per renal  17. Dispo  3/8- will need to see PCP when has autoamputation or sooner- at least in next 2 weeks- also needs f/u labs, esp CBC to f/u on leukocytosis- full w/u was negative- all can think is that could be from dry gangrene- if still there after that, needs Oncology w/u.    LOS: 18 days A FACE TO FACE EVALUATION WAS PERFORMED  Airen Stiehl 07/04/2020, 9:06 AM

## 2020-07-04 NOTE — Progress Notes (Signed)
Renal Navigator faxed discharge notes to outpatient HD clinic/[facility] to provide continuity of care.  Alphonzo Cruise, [place] Renal Navigator [phone]

## 2020-07-05 DIAGNOSIS — G709 Myoneural disorder, unspecified: Secondary | ICD-10-CM | POA: Diagnosis not present

## 2020-07-05 DIAGNOSIS — I509 Heart failure, unspecified: Secondary | ICD-10-CM | POA: Diagnosis not present

## 2020-07-05 DIAGNOSIS — D509 Iron deficiency anemia, unspecified: Secondary | ICD-10-CM | POA: Diagnosis not present

## 2020-07-05 DIAGNOSIS — E785 Hyperlipidemia, unspecified: Secondary | ICD-10-CM | POA: Diagnosis not present

## 2020-07-05 DIAGNOSIS — Z4801 Encounter for change or removal of surgical wound dressing: Secondary | ICD-10-CM | POA: Diagnosis not present

## 2020-07-05 DIAGNOSIS — I132 Hypertensive heart and chronic kidney disease with heart failure and with stage 5 chronic kidney disease, or end stage renal disease: Secondary | ICD-10-CM | POA: Diagnosis not present

## 2020-07-05 DIAGNOSIS — Z4781 Encounter for orthopedic aftercare following surgical amputation: Secondary | ICD-10-CM | POA: Diagnosis not present

## 2020-07-05 DIAGNOSIS — J45909 Unspecified asthma, uncomplicated: Secondary | ICD-10-CM | POA: Diagnosis not present

## 2020-07-05 DIAGNOSIS — Z7982 Long term (current) use of aspirin: Secondary | ICD-10-CM | POA: Diagnosis not present

## 2020-07-05 DIAGNOSIS — N186 End stage renal disease: Secondary | ICD-10-CM | POA: Diagnosis not present

## 2020-07-05 DIAGNOSIS — M1990 Unspecified osteoarthritis, unspecified site: Secondary | ICD-10-CM | POA: Diagnosis not present

## 2020-07-05 DIAGNOSIS — Z7902 Long term (current) use of antithrombotics/antiplatelets: Secondary | ICD-10-CM | POA: Diagnosis not present

## 2020-07-05 DIAGNOSIS — Z89522 Acquired absence of left knee: Secondary | ICD-10-CM | POA: Diagnosis not present

## 2020-07-05 DIAGNOSIS — Z992 Dependence on renal dialysis: Secondary | ICD-10-CM | POA: Diagnosis not present

## 2020-07-05 DIAGNOSIS — Z993 Dependence on wheelchair: Secondary | ICD-10-CM | POA: Diagnosis not present

## 2020-07-05 DIAGNOSIS — E8809 Other disorders of plasma-protein metabolism, not elsewhere classified: Secondary | ICD-10-CM | POA: Diagnosis not present

## 2020-07-05 DIAGNOSIS — E1152 Type 2 diabetes mellitus with diabetic peripheral angiopathy with gangrene: Secondary | ICD-10-CM | POA: Diagnosis not present

## 2020-07-05 DIAGNOSIS — Z794 Long term (current) use of insulin: Secondary | ICD-10-CM | POA: Diagnosis not present

## 2020-07-05 DIAGNOSIS — Z79891 Long term (current) use of opiate analgesic: Secondary | ICD-10-CM | POA: Diagnosis not present

## 2020-07-05 DIAGNOSIS — Z79899 Other long term (current) drug therapy: Secondary | ICD-10-CM | POA: Diagnosis not present

## 2020-07-05 DIAGNOSIS — D72829 Elevated white blood cell count, unspecified: Secondary | ICD-10-CM

## 2020-07-05 DIAGNOSIS — D631 Anemia in chronic kidney disease: Secondary | ICD-10-CM | POA: Diagnosis not present

## 2020-07-05 DIAGNOSIS — N2581 Secondary hyperparathyroidism of renal origin: Secondary | ICD-10-CM | POA: Diagnosis not present

## 2020-07-05 DIAGNOSIS — E1122 Type 2 diabetes mellitus with diabetic chronic kidney disease: Secondary | ICD-10-CM | POA: Diagnosis not present

## 2020-07-05 DIAGNOSIS — Z87891 Personal history of nicotine dependence: Secondary | ICD-10-CM | POA: Diagnosis not present

## 2020-07-06 ENCOUNTER — Telehealth: Payer: Self-pay

## 2020-07-06 ENCOUNTER — Ambulatory Visit: Payer: Medicare Other | Admitting: Family Medicine

## 2020-07-06 NOTE — Telephone Encounter (Addendum)
Transitional Care Call--who you spoke with: Patient Christy Zhang   1. Are you/is patient experiencing any problems since coming home?No problems. Patient still waiting on hospital bed.   Are there any questions regarding any aspect of care? No. 2. Are there any questions regarding medications administration/dosing? No.  Are meds being taken as prescribed?Yes.  Patient should review meds with caller to confirm. Medications reviewed.  Have there been any falls? No falls.  3. Has Home Health been to the house and/or have they contacted you?Yes, visited today.  If not, have you tried to contact them? N/a.  Can we help you contact them? No help needed.  4. Are bowels and bladder emptying properly?No issues.  Are there any unexpected incontinence issues?None.  If applicable, is patient following bowel/bladder programs?N/A 5. Any fevers, problems with breathing, unexpected pain? None. 6. Are there any skin problems or new areas of breakdown? None.  7. Has the patient/family member arranged specialty MD follow up (ie cardiology/neurology/renal/surgical/etc)? Yes.  Can we help arrange? No help needed.  8. Does the patient need any other services or support that we can help arrange? No. 9. Are caregivers following through as expected in assisting the patient?Yes.         11. Has the patient quit smoking, drinking alcohol, or using drugs as recommended? Patient does not take part in such activities."I am too old for that".  Appointment Date/Time/ Arrival time/ and who they are seeing 07/18/2020 at 1:20 with Danella Sensing NP.  [address] [phone]

## 2020-07-07 DIAGNOSIS — D631 Anemia in chronic kidney disease: Secondary | ICD-10-CM | POA: Diagnosis not present

## 2020-07-07 DIAGNOSIS — N186 End stage renal disease: Secondary | ICD-10-CM | POA: Diagnosis not present

## 2020-07-07 DIAGNOSIS — N2581 Secondary hyperparathyroidism of renal origin: Secondary | ICD-10-CM | POA: Diagnosis not present

## 2020-07-07 DIAGNOSIS — D509 Iron deficiency anemia, unspecified: Secondary | ICD-10-CM | POA: Diagnosis not present

## 2020-07-07 DIAGNOSIS — Z992 Dependence on renal dialysis: Secondary | ICD-10-CM | POA: Diagnosis not present

## 2020-07-10 DIAGNOSIS — N2581 Secondary hyperparathyroidism of renal origin: Secondary | ICD-10-CM | POA: Diagnosis not present

## 2020-07-10 DIAGNOSIS — D509 Iron deficiency anemia, unspecified: Secondary | ICD-10-CM | POA: Diagnosis not present

## 2020-07-10 DIAGNOSIS — D631 Anemia in chronic kidney disease: Secondary | ICD-10-CM | POA: Diagnosis not present

## 2020-07-10 DIAGNOSIS — Z992 Dependence on renal dialysis: Secondary | ICD-10-CM | POA: Diagnosis not present

## 2020-07-10 DIAGNOSIS — N186 End stage renal disease: Secondary | ICD-10-CM | POA: Diagnosis not present

## 2020-07-11 ENCOUNTER — Ambulatory Visit: Payer: Medicare Other | Admitting: Licensed Clinical Social Worker

## 2020-07-11 ENCOUNTER — Ambulatory Visit (INDEPENDENT_AMBULATORY_CARE_PROVIDER_SITE_OTHER): Payer: Medicare Other | Admitting: Physician Assistant

## 2020-07-11 ENCOUNTER — Other Ambulatory Visit: Payer: Self-pay

## 2020-07-11 VITALS — BP 174/76 | HR 76 | Temp 97.4°F | Resp 20 | Ht 63.0 in

## 2020-07-11 DIAGNOSIS — I151 Hypertension secondary to other renal disorders: Secondary | ICD-10-CM

## 2020-07-11 DIAGNOSIS — E1122 Type 2 diabetes mellitus with diabetic chronic kidney disease: Secondary | ICD-10-CM | POA: Diagnosis not present

## 2020-07-11 DIAGNOSIS — N2889 Other specified disorders of kidney and ureter: Secondary | ICD-10-CM

## 2020-07-11 DIAGNOSIS — Z4801 Encounter for change or removal of surgical wound dressing: Secondary | ICD-10-CM | POA: Diagnosis not present

## 2020-07-11 DIAGNOSIS — Z4781 Encounter for orthopedic aftercare following surgical amputation: Secondary | ICD-10-CM | POA: Diagnosis not present

## 2020-07-11 DIAGNOSIS — IMO0002 Reserved for concepts with insufficient information to code with codable children: Secondary | ICD-10-CM

## 2020-07-11 DIAGNOSIS — I739 Peripheral vascular disease, unspecified: Secondary | ICD-10-CM

## 2020-07-11 DIAGNOSIS — N186 End stage renal disease: Secondary | ICD-10-CM

## 2020-07-11 DIAGNOSIS — I132 Hypertensive heart and chronic kidney disease with heart failure and with stage 5 chronic kidney disease, or end stage renal disease: Secondary | ICD-10-CM | POA: Diagnosis not present

## 2020-07-11 DIAGNOSIS — E1152 Type 2 diabetes mellitus with diabetic peripheral angiopathy with gangrene: Secondary | ICD-10-CM | POA: Diagnosis not present

## 2020-07-11 DIAGNOSIS — I509 Heart failure, unspecified: Secondary | ICD-10-CM | POA: Diagnosis not present

## 2020-07-11 DIAGNOSIS — J4530 Mild persistent asthma, uncomplicated: Secondary | ICD-10-CM

## 2020-07-11 NOTE — Progress Notes (Signed)
POST OPERATIVE OFFICE NOTE    CC:  F/u for surgery  HPI:  This is a 65 y.o. female who is here for follow up visit.  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  She also has a new wound on the right foot with second toe dry gangrene.  She denise LE pain, fever and chills.      Allergies  Allergen Reactions  . Sulfa Antibiotics Swelling    Current Outpatient Medications  Medication Sig Dispense Refill  . acetaminophen (TYLENOL) 325 MG tablet Take 1-2 tablets (325-650 mg total) by mouth every 4 (four) hours as needed for mild pain.    Marland Kitchen amLODipine (NORVASC) 5 MG tablet Take 1 tablet (5 mg total) by mouth at bedtime. 30 tablet 0  . aspirin EC 81 MG tablet Take 81 mg by mouth at bedtime. Swallow whole.    Marland Kitchen atorvastatin (LIPITOR) 40 MG tablet Take 1 tablet (40 mg total) by mouth daily. (Patient taking differently: Take 40 mg by mouth at bedtime.) 90 tablet 3  . clopidogrel (PLAVIX) 75 MG tablet Take 1 tablet (75 mg total) by mouth daily. 30 tablet 11  . diclofenac Sodium (VOLTAREN) 1 % GEL Apply 2 g topically 4 (four) times daily. 350 g 0  . diphenhydrAMINE (BENADRYL) 25 MG tablet Take 25 mg by mouth every 6 (six) hours as needed for itching.    . Dulaglutide (TRULICITY) 1.5 0000000 SOPN Inject 1.5 mg into the skin once a week. Saturday    . gabapentin (NEURONTIN) 300 MG capsule Take 1 capsule (300 mg total) by mouth at bedtime. 30 capsule 0  . HYDROcodone-acetaminophen (NORCO/VICODIN) 5-325 MG tablet Take 1 tablet by mouth every 12 (twelve) hours as needed for severe pain. 14 tablet 0  . insulin glargine, 1 Unit Dial, (TOUJEO) 300 UNIT/ML Solostar Pen Inject 16 Units into the skin daily.    . metoprolol succinate (TOPROL-XL) 50 MG 24 hr tablet Take 1 tablet (50 mg total) by mouth daily. Take with or immediately following a meal. 30 tablet 0  . multivitamin (RENA-VIT) TABS tablet Take 1 tablet by mouth at bedtime.     . Nutritional Supplements (,FEEDING  SUPPLEMENT, PROSOURCE PLUS) liquid Take 30 mLs by mouth 2 (two) times daily between meals. 887 mL 0  . polyethylene glycol (MIRALAX / GLYCOLAX) 17 g packet Take 17 g by mouth daily. 14 each 0  . senna-docusate (SENOKOT-S) 8.6-50 MG tablet Take 1 tablet by mouth 2 (two) times daily. 60 tablet 0  . sevelamer carbonate (RENVELA) 800 MG tablet Take 1,600-2,400 mg by mouth See admin instructions. Take 2400 mg with each meal and 1600 mg with each snack    . traMADol (ULTRAM) 50 MG tablet Take 1 tablet (50 mg total) by mouth every 12 (twelve) hours as needed for moderate pain. 14 tablet 0   No current facility-administered medications for this visit.     ROS:  See HPI  Physical Exam:          Incision:  Left BKA incision has healed.  The skin around the incision is dark in color.  The stump is warm to touch.  There is no edema or drainage.  Sutures and staples removed today patient tolerated this well. Extremities:  Right LE with peroneal doppler signal, foot is warm.  Second toe DIP was hanging on by a tendon.  I cut the tendon and painted the residual toe surface with Betadine and  covered with dry dressing      Assessment/Plan:  This is a 65 y.o. female who is s/p:Left BKA and auto amputated with assistance right second toe in office today. The left BKA has some darkened skin edges so we will watch it closely for healing.    Paint second toe stump with betadine daily Left BKA wash as needed. F/U for wound check in 2 weeks.   Roxy Horseman PA-C [facility] [phone]   Clinic MD:  Carlis Abbott

## 2020-07-11 NOTE — Chronic Care Management (AMB) (Signed)
Chronic Care Management    Clinical Social Work Note  07/11/2020 Name: Christy Zhang MRN: [id number] DOB: February 07, 1956  Christy Zhang is a 65 y.o. year old female who is a primary care patient of [name], [name], MD. The CCM team was consulted to assist the patient with chronic disease management and/or care coordination needs related to: [name] .   Engaged with patient by telephone for follow up visit in response to provider referral for social work chronic care management and care coordination services.   Consent to Services:  The patient was given information about Chronic Care Management services, agreed to services, and gave verbal consent prior to initiation of services.  Please see initial visit note for detailed documentation.   Patient agreed to services and consent obtained.   Assessment: Review of patient past medical history, allergies, medications, and health status, including review of relevant consultants reports was performed today as part of a comprehensive evaluation and provision of chronic care management and care coordination services.     SDOH (Social Determinants of Health) assessments and interventions performed:    Advanced Directives Status: See Vynca application for related entries.  CCM Care Plan  Allergies  Allergen Reactions  . Sulfa Antibiotics Swelling    Outpatient Encounter Medications as of 07/11/2020  Medication Sig  . acetaminophen (TYLENOL) 325 MG tablet Take 1-2 tablets (325-650 mg total) by mouth every 4 (four) hours as needed for mild pain.  Marland Kitchen amLODipine (NORVASC) 5 MG tablet Take 1 tablet (5 mg total) by mouth at bedtime.  Marland Kitchen aspirin EC 81 MG tablet Take 81 mg by mouth at bedtime. Swallow whole.  Marland Kitchen atorvastatin (LIPITOR) 40 MG tablet Take 1 tablet (40 mg total) by mouth daily. (Patient taking differently: Take 40 mg by mouth at bedtime.)  . clopidogrel (PLAVIX) 75 MG tablet Take 1 tablet (75 mg total) by mouth daily.  .  diclofenac Sodium (VOLTAREN) 1 % GEL Apply 2 g topically 4 (four) times daily.  . diphenhydrAMINE (BENADRYL) 25 MG tablet Take 25 mg by mouth every 6 (six) hours as needed for itching.  . Dulaglutide (TRULICITY) 1.5 0000000 SOPN Inject 1.5 mg into the skin once a week. Saturday  . gabapentin (NEURONTIN) 300 MG capsule Take 1 capsule (300 mg total) by mouth at bedtime.  Marland Kitchen HYDROcodone-acetaminophen (NORCO/VICODIN) 5-325 MG tablet Take 1 tablet by mouth every 12 (twelve) hours as needed for severe pain.  Marland Kitchen insulin glargine, 1 Unit Dial, (TOUJEO) 300 UNIT/ML Solostar Pen Inject 16 Units into the skin daily.  . metoprolol succinate (TOPROL-XL) 50 MG 24 hr tablet Take 1 tablet (50 mg total) by mouth daily. Take with or immediately following a meal.  . multivitamin (RENA-VIT) TABS tablet Take 1 tablet by mouth at bedtime.   . Nutritional Supplements (,FEEDING SUPPLEMENT, PROSOURCE PLUS) liquid Take 30 mLs by mouth 2 (two) times daily between meals.  . polyethylene glycol (MIRALAX / GLYCOLAX) 17 g packet Take 17 g by mouth daily.  Marland Kitchen senna-docusate (SENOKOT-S) 8.6-50 MG tablet Take 1 tablet by mouth 2 (two) times daily.  . sevelamer carbonate (RENVELA) 800 MG tablet Take 1,600-2,400 mg by mouth See admin instructions. Take 2400 mg with each meal and 1600 mg with each snack  . traMADol (ULTRAM) 50 MG tablet Take 1 tablet (50 mg total) by mouth every 12 (twelve) hours as needed for moderate pain.   No facility-administered encounter medications on file as of 07/11/2020.    Patient Active Problem List   Diagnosis  Date Noted  . Leucocytosis 07/05/2020  . Below-knee amputation of left lower extremity ([place]) 06/16/2020  . S/P BKA (below knee amputation) unilateral ([place]) 06/08/2020  . Gangrene ([place])   . Abdominal wall cellulitis   . Panniculitis 12/29/2019  . Osteomyelitis ([place]) 10/15/2019  . Hyperglycemia due to diabetes mellitus ([place]) 10/15/2019  . Osteomyelitis of third toe of right foot ([place])  10/14/2019  . Controlled type 2 diabetes mellitus with chronic kidney disease on chronic dialysis, with long-term current use of insulin ([place]) 12/02/2016  . ESRD (end stage renal disease) ([place])   . Asthma, mild persistent 07/25/2015  . Hypertension secondary to other renal disorders 07/25/2015  . Diabetes ([place]) 03/30/2015  . Anemia in chronic kidney disease 02/17/2015  . 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 Essential (primary) hypertension 06/24/2014  . Heart failure ([place]) 06/24/2014  . Obesity, Class III, BMI 40-49.9 (morbid obesity) ([place]) 06/24/2014    Conditions to be addressed/monitored: ; Needs coping skills to manage medical conditions; needs support for Diabetes management  Care Plan : LCSW Care Plan  Updates made by Katha Cabal, LCSW since 07/11/2020 12:00 AM    Problem: Coping Skills (General Plan of Care)     Goal: Client will learn coping skills to help her manage medical issues and condtions faced by client   Start Date: 07/11/2020  Expected End Date: 10/11/2020  This Visit's Progress: Not on track  Priority: High  Note:   Current barriers:   Mobility issues (recent BKA (Left) Challenges in managing multiple medical conditions Anxiety and Stress related to functional changes due to recent BKA  Clinical Goals:  patient will work with SW in next 30 days to address concerns related to client management of current medical conditions patient will attend all scheduled medical appointments in next 30 days  as evidenced by patient report and care team review of appointment completion in EMR:  Client will talk with LCSW in next 30 days about community resources of assistance for client  Clinical Interventions:  . Collaboration with Claretta Fraise, MD regarding development and update of comprehensive plan of care as evidenced by provider attestation and co-signature . Inter-disciplinary care team collaboration (see longitudinal plan  of care) . Assessment of needs, barriers faced by client.  . Chart review which showed client discharged home after hospital care. Daughter of client is caregiver for client   Patient Strengths: Has support from daughter, Ledon Snare of client is important to client Client is strong advocate for herself  Patient Deficits: Challenges with Managing Diabetes Challenges with Completing Dialysis Treatment weekly  Patient Goals:   Patient to attend scheduled medical appointments in next 30 days Patient to take medications as prescribed Patient to communicate with RNCM or LCSW as needed for support Patient to communicate regularly with daughter, [name], for support Patient to practice self care and allow time to rest and relax -  Follow Up Plan: LCSW to call client on 08/11/2020 to assess client needs      Norva Riffle.Weltha Cathy MSW, LCSW Licensed Clinical Social Worker [facility] Care Management [phone]

## 2020-07-11 NOTE — Patient Instructions (Signed)
Visit Information  PATIENT GOALS: Goals Addressed            This Visit's Progress   . Protect My Health       Timeframe:  Short-Term Goal Priority:  High Start Date:      07/11/2020                       Expected End Date:       10/11/2020                Follow Up Date 08/11/2020    Protect My Health (Patient). Patient needs to learn coping skills to help her manage medical conditions faced (Has diabetes, has weekly Dialysis treatments, and recently had a Left BKA completed  Why is this important?    Screening tests can find diseases early when they are easier to treat.   Your doctor or nurse will talk with you about which tests are important for you.   Getting shots for common diseases like the flu and shingles will help prevent them.     Patient Strengths: Has support from daughter, Ledon Snare of client is important to client Client is strong advocate for herself  Patient Deficits: Challenges with Managing Diabetes Challenges with Completing Dialysis Treatment weekly  Patient Goals:   Patient to attend scheduled medical appointments in next 30 days Patient to take medications as prescribed Patient to communicate with RNCM or LCSW as needed for support Patient to communicate regularly with daughter, [name], for support Patient to practice self care and allow time to rest and relax -  Follow Up Plan: LCSW to call client on 08/11/2020 to assess client needs       Follow Up Plan:  LCSW to call client on 08/11/2020 to assess client needs  Norva Riffle.Helia Haese MSW, LCSW Licensed Clinical Social Worker [facility] Care Management [phone]

## 2020-07-12 DIAGNOSIS — D631 Anemia in chronic kidney disease: Secondary | ICD-10-CM | POA: Diagnosis not present

## 2020-07-12 DIAGNOSIS — D509 Iron deficiency anemia, unspecified: Secondary | ICD-10-CM | POA: Diagnosis not present

## 2020-07-12 DIAGNOSIS — Z992 Dependence on renal dialysis: Secondary | ICD-10-CM | POA: Diagnosis not present

## 2020-07-12 DIAGNOSIS — N2581 Secondary hyperparathyroidism of renal origin: Secondary | ICD-10-CM | POA: Diagnosis not present

## 2020-07-12 DIAGNOSIS — N186 End stage renal disease: Secondary | ICD-10-CM | POA: Diagnosis not present

## 2020-07-13 DIAGNOSIS — E1122 Type 2 diabetes mellitus with diabetic chronic kidney disease: Secondary | ICD-10-CM | POA: Diagnosis not present

## 2020-07-13 DIAGNOSIS — I509 Heart failure, unspecified: Secondary | ICD-10-CM | POA: Diagnosis not present

## 2020-07-13 DIAGNOSIS — Z4781 Encounter for orthopedic aftercare following surgical amputation: Secondary | ICD-10-CM | POA: Diagnosis not present

## 2020-07-13 DIAGNOSIS — E1152 Type 2 diabetes mellitus with diabetic peripheral angiopathy with gangrene: Secondary | ICD-10-CM | POA: Diagnosis not present

## 2020-07-13 DIAGNOSIS — Z4801 Encounter for change or removal of surgical wound dressing: Secondary | ICD-10-CM | POA: Diagnosis not present

## 2020-07-13 DIAGNOSIS — I132 Hypertensive heart and chronic kidney disease with heart failure and with stage 5 chronic kidney disease, or end stage renal disease: Secondary | ICD-10-CM | POA: Diagnosis not present

## 2020-07-14 ENCOUNTER — Telehealth: Payer: Self-pay | Admitting: *Deleted

## 2020-07-14 ENCOUNTER — Telehealth: Payer: Medicare Other | Admitting: *Deleted

## 2020-07-14 DIAGNOSIS — N2581 Secondary hyperparathyroidism of renal origin: Secondary | ICD-10-CM | POA: Diagnosis not present

## 2020-07-14 DIAGNOSIS — N186 End stage renal disease: Secondary | ICD-10-CM | POA: Diagnosis not present

## 2020-07-14 DIAGNOSIS — D631 Anemia in chronic kidney disease: Secondary | ICD-10-CM | POA: Diagnosis not present

## 2020-07-14 DIAGNOSIS — D509 Iron deficiency anemia, unspecified: Secondary | ICD-10-CM | POA: Diagnosis not present

## 2020-07-14 DIAGNOSIS — Z992 Dependence on renal dialysis: Secondary | ICD-10-CM | POA: Diagnosis not present

## 2020-07-14 NOTE — Telephone Encounter (Signed)
  Chronic Care Management   Outreach Note  07/14/2020 Name: Christy Zhang MRN: [id number] DOB: [birth year]  Referred by: Claretta Fraise, MD Reason for referral : Chronic Care Management (Unsuccessful RN initial telephone visit)   An unsuccessful telephone outreach was attempted today. The patient was referred to the case management team for assistance with care management and care coordination.   Follow Up Plan:   Telephone follow up appointment with RNCM: 07/27/20  A HIPAA compliant phone message was left for the patient providing contact information and requesting a return call.   Next PCP appointment scheduled for: Dr Livia Snellen 07/20/20  Telephone follow-up with [place]: 07/18/20  Telephone follow-up with LCSW: 08/15/20  Appointment with vascular surgeon: 07/25/20  Chong Sicilian, BSN, RN-BC [place] / [place] Management Direct Dial: [phone]

## 2020-07-17 ENCOUNTER — Encounter: Payer: Medicare Other | Admitting: Registered Nurse

## 2020-07-17 DIAGNOSIS — N186 End stage renal disease: Secondary | ICD-10-CM | POA: Diagnosis not present

## 2020-07-17 DIAGNOSIS — Z4781 Encounter for orthopedic aftercare following surgical amputation: Secondary | ICD-10-CM | POA: Diagnosis not present

## 2020-07-17 DIAGNOSIS — I132 Hypertensive heart and chronic kidney disease with heart failure and with stage 5 chronic kidney disease, or end stage renal disease: Secondary | ICD-10-CM | POA: Diagnosis not present

## 2020-07-17 DIAGNOSIS — Z992 Dependence on renal dialysis: Secondary | ICD-10-CM | POA: Diagnosis not present

## 2020-07-17 DIAGNOSIS — D631 Anemia in chronic kidney disease: Secondary | ICD-10-CM | POA: Diagnosis not present

## 2020-07-17 DIAGNOSIS — N2581 Secondary hyperparathyroidism of renal origin: Secondary | ICD-10-CM | POA: Diagnosis not present

## 2020-07-17 DIAGNOSIS — I509 Heart failure, unspecified: Secondary | ICD-10-CM | POA: Diagnosis not present

## 2020-07-17 DIAGNOSIS — E1152 Type 2 diabetes mellitus with diabetic peripheral angiopathy with gangrene: Secondary | ICD-10-CM | POA: Diagnosis not present

## 2020-07-17 DIAGNOSIS — E1122 Type 2 diabetes mellitus with diabetic chronic kidney disease: Secondary | ICD-10-CM | POA: Diagnosis not present

## 2020-07-17 DIAGNOSIS — D509 Iron deficiency anemia, unspecified: Secondary | ICD-10-CM | POA: Diagnosis not present

## 2020-07-17 DIAGNOSIS — Z4801 Encounter for change or removal of surgical wound dressing: Secondary | ICD-10-CM | POA: Diagnosis not present

## 2020-07-18 ENCOUNTER — Encounter: Payer: Medicare Other | Admitting: Registered Nurse

## 2020-07-18 DIAGNOSIS — I509 Heart failure, unspecified: Secondary | ICD-10-CM | POA: Diagnosis not present

## 2020-07-18 DIAGNOSIS — E1122 Type 2 diabetes mellitus with diabetic chronic kidney disease: Secondary | ICD-10-CM | POA: Diagnosis not present

## 2020-07-18 DIAGNOSIS — Z4781 Encounter for orthopedic aftercare following surgical amputation: Secondary | ICD-10-CM | POA: Diagnosis not present

## 2020-07-18 DIAGNOSIS — Z4801 Encounter for change or removal of surgical wound dressing: Secondary | ICD-10-CM | POA: Diagnosis not present

## 2020-07-18 DIAGNOSIS — E1152 Type 2 diabetes mellitus with diabetic peripheral angiopathy with gangrene: Secondary | ICD-10-CM | POA: Diagnosis not present

## 2020-07-18 DIAGNOSIS — I132 Hypertensive heart and chronic kidney disease with heart failure and with stage 5 chronic kidney disease, or end stage renal disease: Secondary | ICD-10-CM | POA: Diagnosis not present

## 2020-07-19 DIAGNOSIS — D509 Iron deficiency anemia, unspecified: Secondary | ICD-10-CM | POA: Diagnosis not present

## 2020-07-19 DIAGNOSIS — D631 Anemia in chronic kidney disease: Secondary | ICD-10-CM | POA: Diagnosis not present

## 2020-07-19 DIAGNOSIS — N2581 Secondary hyperparathyroidism of renal origin: Secondary | ICD-10-CM | POA: Diagnosis not present

## 2020-07-19 DIAGNOSIS — Z992 Dependence on renal dialysis: Secondary | ICD-10-CM | POA: Diagnosis not present

## 2020-07-19 DIAGNOSIS — N186 End stage renal disease: Secondary | ICD-10-CM | POA: Diagnosis not present

## 2020-07-20 ENCOUNTER — Ambulatory Visit (INDEPENDENT_AMBULATORY_CARE_PROVIDER_SITE_OTHER): Payer: Medicare Other | Admitting: Family Medicine

## 2020-07-20 ENCOUNTER — Encounter: Payer: Self-pay | Admitting: Family Medicine

## 2020-07-20 ENCOUNTER — Other Ambulatory Visit: Payer: Self-pay

## 2020-07-20 VITALS — BP 155/75 | HR 87 | Temp 98.3°F

## 2020-07-20 DIAGNOSIS — E1122 Type 2 diabetes mellitus with diabetic chronic kidney disease: Secondary | ICD-10-CM

## 2020-07-20 DIAGNOSIS — Z4801 Encounter for change or removal of surgical wound dressing: Secondary | ICD-10-CM | POA: Diagnosis not present

## 2020-07-20 DIAGNOSIS — I132 Hypertensive heart and chronic kidney disease with heart failure and with stage 5 chronic kidney disease, or end stage renal disease: Secondary | ICD-10-CM | POA: Diagnosis not present

## 2020-07-20 DIAGNOSIS — I509 Heart failure, unspecified: Secondary | ICD-10-CM | POA: Diagnosis not present

## 2020-07-20 DIAGNOSIS — N186 End stage renal disease: Secondary | ICD-10-CM | POA: Diagnosis not present

## 2020-07-20 DIAGNOSIS — Z4781 Encounter for orthopedic aftercare following surgical amputation: Secondary | ICD-10-CM | POA: Diagnosis not present

## 2020-07-20 DIAGNOSIS — Z794 Long term (current) use of insulin: Secondary | ICD-10-CM

## 2020-07-20 DIAGNOSIS — Z992 Dependence on renal dialysis: Secondary | ICD-10-CM | POA: Diagnosis not present

## 2020-07-20 DIAGNOSIS — I10 Essential (primary) hypertension: Secondary | ICD-10-CM

## 2020-07-20 DIAGNOSIS — S88112A Complete traumatic amputation at level between knee and ankle, left lower leg, initial encounter: Secondary | ICD-10-CM | POA: Diagnosis not present

## 2020-07-20 DIAGNOSIS — E1152 Type 2 diabetes mellitus with diabetic peripheral angiopathy with gangrene: Secondary | ICD-10-CM | POA: Diagnosis not present

## 2020-07-20 NOTE — Progress Notes (Signed)
Subjective:  Patient ID: Christy Zhang,  female    DOB: 12/27/1955  Age: 65 y.o.    CC: Medical Management of Chronic Issues   HPI Christy Zhang presents for  follow-up of hypertension. Patient has no history of headache chest pain or shortness of breath or recent cough. Patient also denies symptoms of TIA such as numbness weakness lateralizing. Patient denies side effects from medication. States taking it regularly.  Patient goes to dialysis on Monday Wednesday Friday in [place].  She is followed by nephrology there who adjusts her dialysis set for her blood pressure apparently.  She tells me that her blood pressure although high here was 076 systolic at home just an hour before she came.  That was done by her physical therapist. She is working with Ms. Coulson for rehab of the LLE after BKA  Currently she is in a wheelchair.  Patient also  in for follow-up of elevated cholesterol. Doing well without complaints on current medication. Denies side effects  including myalgia and arthralgia and nausea. Also in today for liver function testing. Currently no chest pain, shortness of breath or other cardiovascular related symptoms noted.  Follow-up of diabetes. Patient does not check blood sugar at home.  She was recently hospitalized for a left below the knee amputation.  During that time she had multiple capillary blood sugar readings noted.  Her A1c was noted on February 22 during that hospitalization to be 5.9. Patient denies symptoms such as excessive hunger or urinary frequency, excessive hunger, nausea No significant hypoglycemic spells noted. Medications reviewed. Pt reports taking them regularly. Pt. denies complication/adverse reaction today.    History Christy Zhang has a past medical history of Anemia, Arthritis, Asthma, CHF (congestive heart failure) ([place]), Chronic kidney disease, Diabetes mellitus with complication ([place]), End stage renal disease ([place]), End stage renal disease on  dialysis ([place]), Gangrene ([place]), Hemodialysis access, arteriovenous graft ([place]), Hyperglycemia due to diabetes mellitus ([place]) (10/15/2019), Hyperlipidemia, Hypertension, and Neuromuscular disorder ([place]).   She has a past surgical history that includes Insertion of dialysis catheter; Tubal ligation; AV fistula placement (Right, 08/25/2014); Fistula superficialization (Right, 11/03/2014); Ligation of competing branches of arteriovenous fistula (Right, 11/03/2014); Fistulogram (Right, 01/16/2016); A/V Fistulagram (Right, 02/04/2017); PERIPHERAL VASCULAR BALLOON ANGIOPLASTY (Right, 02/04/2017); IR THROMBECTOMY AV FISTULA/W THROMBOLYSIS/PTA INC SHUNT/IMG RIGHT (Right, 12/03/2018); IR US Guide Vasc Access Right (12/03/2018); IR Fluoro Guide CV Line Right (12/03/2018); IR US Guide Vasc Access Right (12/03/2018); Bascilic vein transposition (Right, 12/24/2018); AV fistula placement (Right, 03/18/2019); IR Removal Tun Cv Cath W/O FL (07/06/2019); IR THROMBECTOMY AV FISTULA W/THROMBOLYSIS/PTA/STENT INC SHUNT/IMG RT (Right, 10/11/2019); IR US Guide Vasc Access Right (10/14/2019); ABDOMINAL AORTOGRAM W/LOWER EXTREMITY (N/A, 11/18/2019); PERIPHERAL VASCULAR BALLOON ANGIOPLASTY (Right, 11/18/2019); ABDOMINAL AORTOGRAM W/LOWER EXTREMITY (N/A, 02/23/2020); PERIPHERAL VASCULAR BALLOON ANGIOPLASTY (02/23/2020); Transmetatarsal amputation (Left, 02/24/2020);  LEFT BELOW KNEE AMPUTATION (Left Knee) (06/08/2020); and Amputation (Left, 06/08/2020).   Her family history includes Cancer in her father; Diabetes in her daughter, father, mother, and son; Heart disease in her daughter and son; Hypertension in her daughter, mother, sister, and son; Peripheral vascular disease in her son.She reports that she quit smoking about 36 years ago. Her smoking use included cigarettes. She has never used smokeless tobacco. She reports that she does not drink alcohol and does not use drugs.  Current Outpatient Medications on File Prior to Visit  Medication Sig Dispense  Refill  . acetaminophen (TYLENOL) 325 MG tablet Take 1-2 tablets (325-650 mg total) by mouth every 4 (four) hours as  needed for mild pain.    Marland Kitchen amLODipine (NORVASC) 5 MG tablet Take 1 tablet (5 mg total) by mouth at bedtime. 30 tablet 0  . aspirin EC 81 MG tablet Take 81 mg by mouth at bedtime. Swallow whole.    Marland Kitchen atorvastatin (LIPITOR) 40 MG tablet Take 1 tablet (40 mg total) by mouth daily. (Patient taking differently: Take 40 mg by mouth at bedtime.) 90 tablet 3  . clopidogrel (PLAVIX) 75 MG tablet Take 1 tablet (75 mg total) by mouth daily. 30 tablet 11  . diclofenac Sodium (VOLTAREN) 1 % GEL Apply 2 g topically 4 (four) times daily. 350 g 0  . diphenhydrAMINE (BENADRYL) 25 MG tablet Take 25 mg by mouth every 6 (six) hours as needed for itching.    . Dulaglutide (TRULICITY) 1.5 NO/7.0JG SOPN Inject 1.5 mg into the skin once a week. Saturday    . gabapentin (NEURONTIN) 300 MG capsule Take 1 capsule (300 mg total) by mouth at bedtime. 30 capsule 0  . HYDROcodone-acetaminophen (NORCO/VICODIN) 5-325 MG tablet Take 1 tablet by mouth every 12 (twelve) hours as needed for severe pain. 14 tablet 0  . insulin glargine, 1 Unit Dial, (TOUJEO) 300 UNIT/ML Solostar Pen Inject 16 Units into the skin daily.    . metoprolol succinate (TOPROL-XL) 50 MG 24 hr tablet Take 1 tablet (50 mg total) by mouth daily. Take with or immediately following a meal. 30 tablet 0  . multivitamin (RENA-VIT) TABS tablet Take 1 tablet by mouth at bedtime.     . Nutritional Supplements (,FEEDING SUPPLEMENT, PROSOURCE PLUS) liquid Take 30 mLs by mouth 2 (two) times daily between meals. 887 mL 0  . polyethylene glycol (MIRALAX / GLYCOLAX) 17 g packet Take 17 g by mouth daily. 14 each 0  . senna-docusate (SENOKOT-S) 8.6-50 MG tablet Take 1 tablet by mouth 2 (two) times daily. 60 tablet 0  . sevelamer carbonate (RENVELA) 800 MG tablet Take 1,600-2,400 mg by mouth See admin instructions. Take 2400 mg with each meal and 1600 mg with each  snack    . traMADol (ULTRAM) 50 MG tablet Take 1 tablet (50 mg total) by mouth every 12 (twelve) hours as needed for moderate pain. 14 tablet 0   No current facility-administered medications on file prior to visit.    ROS Review of Systems  Objective:  BP (!) 155/75   Pulse 87   Temp 98.3 F (36.8 C)   LMP  (LMP Unknown)   SpO2 97%   BP Readings from Last 3 Encounters:  07/20/20 (!) 155/75  07/11/20 (!) 174/76  07/04/20 (!) 152/41    Wt Readings from Last 3 Encounters:  07/04/20 222 lb 14.2 oz (101.1 kg)  06/16/20 227 lb 4.7 oz (103.1 kg)  04/06/20 235 lb (106.6 kg)     Physical Exam  Diabetic Foot Exam - Simple   No data filed       Assessment & Plan:   Christy Zhang was seen today for medical management of chronic issues.  Diagnoses and all orders for this visit:  Below-knee amputation of left lower extremity ([place]) -     CBC with Differential/Platelet -     CMP14+EGFR -     Lipid panel  Controlled type 2 diabetes mellitus with chronic kidney disease on chronic dialysis, with long-term current use of insulin ([facility]) -     CBC with Differential/Platelet -     CMP14+EGFR -     Lipid panel  ESRD (end stage renal disease) ([place]) -  CBC with Differential/Platelet -     CMP14+EGFR -     Lipid panel  Essential (primary) hypertension -     CBC with Differential/Platelet -     CMP14+EGFR -     Lipid panel   I am having Christy Zhang maintain her sevelamer carbonate, multivitamin, atorvastatin, diphenhydrAMINE, clopidogrel, Trulicity, aspirin EC, polyethylene glycol, senna-docusate, metoprolol succinate, acetaminophen, (feeding supplement) PROSource Plus, amLODipine, diclofenac Sodium, gabapentin, insulin glargine (1 Unit Dial), HYDROcodone-acetaminophen, and traMADol.  No orders of the defined types were placed in this encounter.    Follow-up: Return in about 3 months (around 10/20/2020).  Claretta Fraise, M.D.

## 2020-07-21 DIAGNOSIS — Z992 Dependence on renal dialysis: Secondary | ICD-10-CM | POA: Diagnosis not present

## 2020-07-21 DIAGNOSIS — E1122 Type 2 diabetes mellitus with diabetic chronic kidney disease: Secondary | ICD-10-CM | POA: Diagnosis not present

## 2020-07-21 DIAGNOSIS — I132 Hypertensive heart and chronic kidney disease with heart failure and with stage 5 chronic kidney disease, or end stage renal disease: Secondary | ICD-10-CM | POA: Diagnosis not present

## 2020-07-21 DIAGNOSIS — D509 Iron deficiency anemia, unspecified: Secondary | ICD-10-CM | POA: Diagnosis not present

## 2020-07-21 DIAGNOSIS — N186 End stage renal disease: Secondary | ICD-10-CM | POA: Diagnosis not present

## 2020-07-21 DIAGNOSIS — Z4781 Encounter for orthopedic aftercare following surgical amputation: Secondary | ICD-10-CM | POA: Diagnosis not present

## 2020-07-21 DIAGNOSIS — I509 Heart failure, unspecified: Secondary | ICD-10-CM | POA: Diagnosis not present

## 2020-07-21 DIAGNOSIS — E1152 Type 2 diabetes mellitus with diabetic peripheral angiopathy with gangrene: Secondary | ICD-10-CM | POA: Diagnosis not present

## 2020-07-21 DIAGNOSIS — D631 Anemia in chronic kidney disease: Secondary | ICD-10-CM | POA: Diagnosis not present

## 2020-07-21 DIAGNOSIS — Z4801 Encounter for change or removal of surgical wound dressing: Secondary | ICD-10-CM | POA: Diagnosis not present

## 2020-07-21 DIAGNOSIS — N2581 Secondary hyperparathyroidism of renal origin: Secondary | ICD-10-CM | POA: Diagnosis not present

## 2020-07-21 LAB — LIPID PANEL
Chol/HDL Ratio: 2.2 ratio (ref 0.0–4.4)
Cholesterol, Total: 92 mg/dL — ABNORMAL LOW (ref 100–199)
HDL: 42 mg/dL (ref >39)
LDL Chol Calc (NIH): 35 mg/dL (ref 0–99)
Triglycerides: 69 mg/dL (ref 0–149)
VLDL Cholesterol Cal: 15 mg/dL (ref 5–40)

## 2020-07-21 LAB — CBC WITH DIFFERENTIAL/PLATELET
Basophils Absolute: 0.1 10*3/uL (ref 0.0–0.2)
Basos: 1 %
EOS (ABSOLUTE): 0.5 10*3/uL — ABNORMAL HIGH (ref 0.0–0.4)
Eos: 2 %
Hematocrit: 31.5 % — ABNORMAL LOW (ref 34.0–46.6)
Hemoglobin: 9.2 g/dL — ABNORMAL LOW (ref 11.1–15.9)
Immature Grans (Abs): 0.2 10*3/uL — ABNORMAL HIGH (ref 0.0–0.1)
Immature Granulocytes: 1 %
Lymphocytes Absolute: 2 10*3/uL (ref 0.7–3.1)
Lymphs: 9 %
MCH: 24.7 pg — ABNORMAL LOW (ref 26.6–33.0)
MCHC: 29.2 g/dL — ABNORMAL LOW (ref 31.5–35.7)
MCV: 85 fL (ref 79–97)
Monocytes Absolute: 1.3 10*3/uL — ABNORMAL HIGH (ref 0.1–0.9)
Monocytes: 6 %
Neutrophils Absolute: 17 10*3/uL — ABNORMAL HIGH (ref 1.4–7.0)
Neutrophils: 81 %
Platelets: 716 10*3/uL — ABNORMAL HIGH (ref 150–450)
RBC: 3.73 x10E6/uL — ABNORMAL LOW (ref 3.77–5.28)
RDW: 16.1 % — ABNORMAL HIGH (ref 11.7–15.4)
WBC: 21 10*3/uL (ref 3.4–10.8)

## 2020-07-21 LAB — CMP14+EGFR
ALT: 6 IU/L (ref 0–32)
AST: 9 IU/L (ref 0–40)
Albumin/Globulin Ratio: 0.9 — ABNORMAL LOW (ref 1.2–2.2)
Albumin: 3.3 g/dL — ABNORMAL LOW (ref 3.8–4.8)
Alkaline Phosphatase: 74 IU/L (ref 44–121)
BUN/Creatinine Ratio: 3 — ABNORMAL LOW (ref 12–28)
BUN: 24 mg/dL (ref 8–27)
Bilirubin Total: 0.4 mg/dL (ref 0.0–1.2)
CO2: 25 mmol/L (ref 20–29)
Calcium: 9.2 mg/dL (ref 8.7–10.3)
Chloride: 95 mmol/L — ABNORMAL LOW (ref 96–106)
Creatinine, Ser: 7.95 mg/dL (ref 0.57–1.00)
Globulin, Total: 3.6 g/dL (ref 1.5–4.5)
Glucose: 210 mg/dL — ABNORMAL HIGH (ref 65–99)
Potassium: 4.5 mmol/L (ref 3.5–5.2)
Sodium: 139 mmol/L (ref 134–144)
Total Protein: 6.9 g/dL (ref 6.0–8.5)
eGFR: 5 mL/min/{1.73_m2} — ABNORMAL LOW (ref >59)

## 2020-07-24 ENCOUNTER — Other Ambulatory Visit: Payer: Self-pay

## 2020-07-24 ENCOUNTER — Ambulatory Visit (INDEPENDENT_AMBULATORY_CARE_PROVIDER_SITE_OTHER): Payer: Medicare Other

## 2020-07-24 DIAGNOSIS — Z794 Long term (current) use of insulin: Secondary | ICD-10-CM

## 2020-07-24 DIAGNOSIS — E119 Type 2 diabetes mellitus without complications: Secondary | ICD-10-CM | POA: Diagnosis not present

## 2020-07-24 DIAGNOSIS — E1152 Type 2 diabetes mellitus with diabetic peripheral angiopathy with gangrene: Secondary | ICD-10-CM

## 2020-07-24 DIAGNOSIS — Z89522 Acquired absence of left knee: Secondary | ICD-10-CM

## 2020-07-24 DIAGNOSIS — Z4801 Encounter for change or removal of surgical wound dressing: Secondary | ICD-10-CM | POA: Diagnosis not present

## 2020-07-24 DIAGNOSIS — I132 Hypertensive heart and chronic kidney disease with heart failure and with stage 5 chronic kidney disease, or end stage renal disease: Secondary | ICD-10-CM | POA: Diagnosis not present

## 2020-07-24 DIAGNOSIS — I509 Heart failure, unspecified: Secondary | ICD-10-CM | POA: Diagnosis not present

## 2020-07-24 DIAGNOSIS — Z7902 Long term (current) use of antithrombotics/antiplatelets: Secondary | ICD-10-CM

## 2020-07-24 DIAGNOSIS — E785 Hyperlipidemia, unspecified: Secondary | ICD-10-CM

## 2020-07-24 DIAGNOSIS — Z992 Dependence on renal dialysis: Secondary | ICD-10-CM | POA: Diagnosis not present

## 2020-07-24 DIAGNOSIS — E8809 Other disorders of plasma-protein metabolism, not elsewhere classified: Secondary | ICD-10-CM

## 2020-07-24 DIAGNOSIS — G709 Myoneural disorder, unspecified: Secondary | ICD-10-CM

## 2020-07-24 DIAGNOSIS — Z4781 Encounter for orthopedic aftercare following surgical amputation: Secondary | ICD-10-CM

## 2020-07-24 DIAGNOSIS — M1990 Unspecified osteoarthritis, unspecified site: Secondary | ICD-10-CM | POA: Diagnosis not present

## 2020-07-24 DIAGNOSIS — Z79899 Other long term (current) drug therapy: Secondary | ICD-10-CM

## 2020-07-24 DIAGNOSIS — D509 Iron deficiency anemia, unspecified: Secondary | ICD-10-CM | POA: Diagnosis not present

## 2020-07-24 DIAGNOSIS — N2581 Secondary hyperparathyroidism of renal origin: Secondary | ICD-10-CM | POA: Diagnosis not present

## 2020-07-24 DIAGNOSIS — D631 Anemia in chronic kidney disease: Secondary | ICD-10-CM

## 2020-07-24 DIAGNOSIS — J45909 Unspecified asthma, uncomplicated: Secondary | ICD-10-CM

## 2020-07-24 DIAGNOSIS — N186 End stage renal disease: Secondary | ICD-10-CM | POA: Diagnosis not present

## 2020-07-24 DIAGNOSIS — E1122 Type 2 diabetes mellitus with diabetic chronic kidney disease: Secondary | ICD-10-CM

## 2020-07-24 DIAGNOSIS — Z7982 Long term (current) use of aspirin: Secondary | ICD-10-CM

## 2020-07-25 ENCOUNTER — Ambulatory Visit (INDEPENDENT_AMBULATORY_CARE_PROVIDER_SITE_OTHER): Payer: Medicare Other | Admitting: Physician Assistant

## 2020-07-25 ENCOUNTER — Other Ambulatory Visit: Payer: Self-pay

## 2020-07-25 VITALS — BP 161/68 | HR 86 | Temp 98.6°F | Resp 20 | Ht 63.0 in | Wt 235.0 lb

## 2020-07-25 DIAGNOSIS — I509 Heart failure, unspecified: Secondary | ICD-10-CM | POA: Diagnosis not present

## 2020-07-25 DIAGNOSIS — N186 End stage renal disease: Secondary | ICD-10-CM

## 2020-07-25 DIAGNOSIS — Z4781 Encounter for orthopedic aftercare following surgical amputation: Secondary | ICD-10-CM | POA: Diagnosis not present

## 2020-07-25 DIAGNOSIS — M869 Osteomyelitis, unspecified: Secondary | ICD-10-CM | POA: Diagnosis not present

## 2020-07-25 DIAGNOSIS — Z992 Dependence on renal dialysis: Secondary | ICD-10-CM | POA: Diagnosis not present

## 2020-07-25 DIAGNOSIS — E1152 Type 2 diabetes mellitus with diabetic peripheral angiopathy with gangrene: Secondary | ICD-10-CM | POA: Diagnosis not present

## 2020-07-25 DIAGNOSIS — E1122 Type 2 diabetes mellitus with diabetic chronic kidney disease: Secondary | ICD-10-CM | POA: Diagnosis not present

## 2020-07-25 DIAGNOSIS — I132 Hypertensive heart and chronic kidney disease with heart failure and with stage 5 chronic kidney disease, or end stage renal disease: Secondary | ICD-10-CM | POA: Diagnosis not present

## 2020-07-25 DIAGNOSIS — Z4801 Encounter for change or removal of surgical wound dressing: Secondary | ICD-10-CM | POA: Diagnosis not present

## 2020-07-25 DIAGNOSIS — Z89512 Acquired absence of left leg below knee: Secondary | ICD-10-CM | POA: Diagnosis not present

## 2020-07-25 DIAGNOSIS — I739 Peripheral vascular disease, unspecified: Secondary | ICD-10-CM

## 2020-07-25 MED ORDER — CEPHALEXIN 500 MG PO CAPS: 500.0000 mg | ORAL_CAPSULE | Freq: Two times a day (BID) | ORAL | 0 refills | Status: AC

## 2020-07-25 NOTE — Progress Notes (Signed)
Office Note     CC:  follow up Requesting Provider:  Claretta Fraise, MD  HPI: Christy Zhang is a 65 y.o. (1955-08-30) female who presents for 2 week follow up for wound check. At her last visit on 07/11/20 she was doing well following left BKA on 06/08/20. Her staples and sutures were removed at that visit. Additionally she had portion of her right 2nd toe that auto amputated in the office. She was instructed to clean this daily and paint with betadine. She says that since her visit she has been having some pain in the right 2nd toe that is intermittent and also it has been draining yellow bloody drainage. She denies any rest pain. She denies any fever or chills. She says that home health RN comes out 2x/ week to dress the wound and otherwise her daughter helps her. She does very minimal weight bearing on her right lower extremity. She uses it mostly to transfer otherwise is in wheel chair. She reports no issues with left BKA. She denies any pain. Cleans daily with soap and water  The pt is on a statin for cholesterol management.  The pt is  on a daily aspirin.   Other AC: plavix The pt is on CCB, BB for hypertension.   The pt is diabetic.  Tobacco hx:  Former, quit 1986  Past Medical History:  Diagnosis Date  . Anemia   . Arthritis   . Asthma    in the past  . CHF (congestive heart failure) ([place])   . Chronic kidney disease    Renal failure, dialysis on M/W/F  . 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 End stage renal disease ([place])   . End stage renal disease on dialysis ([place])   . Gangrene ([place])   . Hemodialysis access, arteriovenous graft ([place])   . Hyperglycemia due to diabetes mellitus ([place]) 10/15/2019  . Hyperlipidemia   . Hypertension   . Neuromuscular disorder [facility])     Past Surgical History:  Procedure Laterality Date  .  LEFT BELOW KNEE AMPUTATION (Left Knee)  06/08/2020  . A/V FISTULAGRAM Right 02/04/2017   Procedure: A/V Fistulagram;  Surgeon: Waynetta Sandy, MD;  Location: [facility];  Service: Cardiovascular;  Laterality: Right;  . ABDOMINAL AORTOGRAM W/LOWER EXTREMITY N/A 11/18/2019   Procedure: ABDOMINAL AORTOGRAM W/LOWER EXTREMITY;  Surgeon: [name], MD;  Location: [facility];  Service: Cardiovascular;  Laterality: N/A;  . ABDOMINAL AORTOGRAM W/LOWER EXTREMITY N/A 02/23/2020   Procedure: ABDOMINAL AORTOGRAM W/LOWER EXTREMITY;  Surgeon: [name], MD;  Location: [facility];  Service: Cardiovascular;  Laterality: N/A;  Bilateral  . AMPUTATION Left 06/08/2020   Procedure: LEFT BELOW KNEE AMPUTATION;  Surgeon: Cherre Robins, MD;  Location: [facility];  Service: Vascular;  Laterality: Left;  . AV FISTULA PLACEMENT Right 08/25/2014   Procedure: RIGHT BRACHIOCEPHALIC ARTERIOVENOUS (AV) FISTULA CREATION;  Surgeon: Mal Misty, MD;  Location: [facility];  Service: Vascular;  Laterality: Right;  . AV FISTULA PLACEMENT Right 03/18/2019   Procedure: INSERTION OF ARTERIOVENOUS (AV) GORE-TEX GRAFT RIGHT ARM;  Surgeon: Serafina Mitchell, MD;  Location: [facility];  Service: Vascular;  Laterality: Right;  . BASCILIC VEIN TRANSPOSITION Right 12/24/2018   Procedure: FIRST STAGE BASILIC VEIN TRANSPOSITION RIGHT UPPER ARM;  Surgeon: Serafina Mitchell, MD;  Location: [facility];  Service: Vascular;  Laterality: Right;  . FISTULA SUPERFICIALIZATION Right 11/03/2014   Procedure: RIGHT UPPER  ARM FISTULA SUPERFICIALIZATION;  Surgeon: Mal Misty, MD;  Location: [facility];  Service: Vascular;  Laterality: Right;  . FISTULOGRAM Right 01/16/2016   Procedure: RIGHT UPPER EXTREMITY ARTERIOVENOUS FISTULOGRAM WITH BALLOON ANGIOPLASTY;  Surgeon: Vickie Epley, MD;  Location: [facility];  Service: Vascular;  Laterality: Right;  . INSERTION OF DIALYSIS CATHETER     X4  . IR FLUORO GUIDE CV LINE RIGHT  12/03/2018  . IR REMOVAL TUN CV CATH W/O FL  07/06/2019  . IR THROMBECTOMY AV FISTULA W/THROMBOLYSIS/PTA INC/SHUNT/IMG RIGHT Right 12/03/2018   . IR THROMBECTOMY AV FISTULA W/THROMBOLYSIS/PTA/STENT INC/SHUNT/IMG RT Right 10/11/2019  . IR US GUIDE VASC ACCESS RIGHT  12/03/2018  . IR US GUIDE VASC ACCESS RIGHT  12/03/2018  . IR US GUIDE VASC ACCESS RIGHT  10/14/2019  . LIGATION OF COMPETING BRANCHES OF ARTERIOVENOUS FISTULA Right 11/03/2014   Procedure: LIGATION OF COMPETING BRANCHE x1 OF RIGHT UPPER ARM ARTERIOVENOUS FISTULA;  Surgeon: Mal Misty, MD;  Location: [facility];  Service: Vascular;  Laterality: Right;  . PERIPHERAL VASCULAR BALLOON ANGIOPLASTY Right 02/04/2017   Procedure: PERIPHERAL VASCULAR BALLOON ANGIOPLASTY;  Surgeon: Waynetta Sandy, MD;  Location: [facility];  Service: Cardiovascular;  Laterality: Right;  . PERIPHERAL VASCULAR BALLOON ANGIOPLASTY Right 11/18/2019   Procedure: PERIPHERAL VASCULAR BALLOON ANGIOPLASTY;  Surgeon: [name], MD;  Location: [facility];  Service: Cardiovascular;  Laterality: Right;  anterior tibial  . PERIPHERAL VASCULAR BALLOON ANGIOPLASTY  02/23/2020   Procedure: PERIPHERAL VASCULAR BALLOON ANGIOPLASTY;  Surgeon: [name], MD;  Location: [facility];  Service: Cardiovascular;;  Lt  AT  . TRANSMETATARSAL AMPUTATION Left 02/24/2020   Procedure: TRANSMETATARSAL AMPUTATION LEFT;  Surgeon: Cherre Robins, MD;  Location: [facility];  Service: Vascular;  Laterality: Left;  . TUBAL LIGATION      Social History   Socioeconomic History  . Marital status: Widowed    Spouse name: Not on file  . Number of children: Not on file  . Years of education: Not on file  . Highest education level: Not on file  Occupational History  . Not on file  Tobacco Use  . Smoking status: Former Smoker    Types: Cigarettes    Quit date: 07/25/1984    Years since quitting: 36.0  . Smokeless tobacco: Never Used  Vaping Use  . Vaping Use: Never used  Substance and Sexual Activity  . Alcohol use: No    Alcohol/week: 0.0 standard drinks  . Drug use: No  . Sexual  activity: Not on file  Other Topics Concern  . Not on file  Social History Narrative  . Not on file   Social Determinants of Health   Financial Resource Strain: Not on file  Food Insecurity: Not on file  Transportation Needs: Not on file  Physical Activity: Not on file  Stress: Not on file  Social Connections: Not on file  Intimate Partner Violence: Not on file    Family History  Problem Relation Age of Onset  . Diabetes Mother   . Hypertension Mother   . Cancer Father   . Diabetes Father   . Hypertension Sister   . Diabetes Daughter   . Heart disease Daughter   . Hypertension Daughter   . Diabetes Son   . Heart disease Son   . Hypertension Son   . Peripheral vascular disease Son        amputation    Current Outpatient Medications  Medication Sig Dispense  Refill  . acetaminophen (TYLENOL) 325 MG tablet Take 1-2 tablets (325-650 mg total) by mouth every 4 (four) hours as needed for mild pain.    Marland Kitchen amLODipine (NORVASC) 5 MG tablet Take 1 tablet (5 mg total) by mouth at bedtime. 30 tablet 0  . aspirin EC 81 MG tablet Take 81 mg by mouth at bedtime. Swallow whole.    Marland Kitchen atorvastatin (LIPITOR) 40 MG tablet Take 1 tablet (40 mg total) by mouth daily. (Patient taking differently: Take 40 mg by mouth at bedtime.) 90 tablet 3  . clopidogrel (PLAVIX) 75 MG tablet Take 1 tablet (75 mg total) by mouth daily. 30 tablet 11  . diclofenac Sodium (VOLTAREN) 1 % GEL Apply 2 g topically 4 (four) times daily. 350 g 0  . diphenhydrAMINE (BENADRYL) 25 MG tablet Take 25 mg by mouth every 6 (six) hours as needed for itching.    . Dulaglutide (TRULICITY) 1.5 0000000 SOPN Inject 1.5 mg into the skin once a week. Saturday    . gabapentin (NEURONTIN) 300 MG capsule Take 1 capsule (300 mg total) by mouth at bedtime. 30 capsule 0  . HYDROcodone-acetaminophen (NORCO/VICODIN) 5-325 MG tablet Take 1 tablet by mouth every 12 (twelve) hours as needed for severe pain. 14 tablet 0  . insulin glargine, 1  Unit Dial, (TOUJEO) 300 UNIT/ML Solostar Pen Inject 16 Units into the skin daily.    . metoprolol succinate (TOPROL-XL) 50 MG 24 hr tablet Take 1 tablet (50 mg total) by mouth daily. Take with or immediately following a meal. 30 tablet 0  . multivitamin (RENA-VIT) TABS tablet Take 1 tablet by mouth at bedtime.     . Nutritional Supplements (,FEEDING SUPPLEMENT, PROSOURCE PLUS) liquid Take 30 mLs by mouth 2 (two) times daily between meals. 887 mL 0  . polyethylene glycol (MIRALAX / GLYCOLAX) 17 g packet Take 17 g by mouth daily. 14 each 0  . senna-docusate (SENOKOT-S) 8.6-50 MG tablet Take 1 tablet by mouth 2 (two) times daily. 60 tablet 0  . sevelamer carbonate (RENVELA) 800 MG tablet Take 1,600-2,400 mg by mouth See admin instructions. Take 2400 mg with each meal and 1600 mg with each snack    . traMADol (ULTRAM) 50 MG tablet Take 1 tablet (50 mg total) by mouth every 12 (twelve) hours as needed for moderate pain. 14 tablet 0   No current facility-administered medications for this visit.    Allergies  Allergen Reactions  . Sulfa Antibiotics Swelling     REVIEW OF SYSTEMS:  '[X]'$  denotes positive finding, '[ ]'$  denotes negative finding Cardiac  Comments:  Chest pain or chest pressure:    Shortness of breath upon exertion:    Short of breath when lying flat:    Irregular heart rhythm:        Vascular    Pain in calf, thigh, or hip brought on by ambulation:    Pain in feet at night that wakes you up from your sleep:     Blood clot in your veins:    Leg swelling:         Pulmonary    Oxygen at home:    Productive cough:     Wheezing:         Neurologic    Sudden weakness in arms or legs:     Sudden numbness in arms or legs:     Sudden onset of difficulty speaking or slurred speech:    Temporary loss of vision in one eye:  Problems with dizziness:         Gastrointestinal    Blood in stool:     Vomited blood:         Genitourinary    Burning when urinating:     Blood in  urine:        Psychiatric    Major depression:         Hematologic    Bleeding problems:    Problems with blood clotting too easily:        Skin    Rashes or ulcers:        Constitutional    Fever or chills:      PHYSICAL EXAMINATION:  Vitals:   07/25/20 1019  BP: (!) 161/68  Pulse: 86  Resp: 20  Temp: 98.6 F (37 C)  TempSrc: Temporal  SpO2: 95%  Weight: 235 lb (106.6 kg)  Height: '5\' 3"'$  (1.6 m)    General:  WDWN in NAD; vital signs documented above Gait: Not observed in wheel chair HENT: WNL, normocephalic Pulmonary: normal non-labored breathing  Cardiac: regular HR, without  Murmurs without carotid bruit Abdomen: obese, soft, NT, no masses Vascular Exam/Pulses:  Right Left  Radial 2+ (normal) 2+ (normal)  Femoral 2+ (normal) 2+ (normal)  Popliteal Not palpable Not palpable  DP Not palpable BKA  PT Not palpable BKA   Extremities: with ischemic changes to the right 2nd and third toes as well as plantar aspect of right foot, without Gangrene , without cellulitis; with open wound at site of auto amputation of right 3rd toe. Wound with pink granulation tissue with serosanguinous drainage. Painted with betadine and dry dressings applied    Musculoskeletal: no muscle wasting or atrophy  Neurologic: A&O X 3;  No focal weakness or paresthesias are detected Psychiatric:  The pt has Normal affect.   ASSESSMENT/PLAN:: 65 y.o. female here for follow up for wound check on right foot. She has a auto amputated right 3rd toe with non healing wound and ischemic changes to the 2nd toe. Also on plantar aspect of foot there is area concerning for infection vs. Plantar abscess. Previous angiography has noted inline flow via right AT only. She does have monophasic doppler signal in foot. My concern is wound healing and should she need more proximal amputation what would she be able to heal. I have discussed this with her and recommend Arteriography for possible revascularization.  -  I have sent prescription for keflex 500 mg BID  for 10 days to her pharmacy  - I have scheduled her for Aortogram, arteriogram with possible RLE intervention. This can be arranged with Dr. Stanford Breed or Carlis Abbott whoever has next earliest availability. She dialyzes MWF so will try to arrange this on non dialysis day   Karoline Caldwell, PA-C [facility] [phone]  Clinic MD:  Roxanne Mins

## 2020-07-26 DIAGNOSIS — N2581 Secondary hyperparathyroidism of renal origin: Secondary | ICD-10-CM | POA: Diagnosis not present

## 2020-07-26 DIAGNOSIS — Z4781 Encounter for orthopedic aftercare following surgical amputation: Secondary | ICD-10-CM | POA: Diagnosis not present

## 2020-07-26 DIAGNOSIS — D509 Iron deficiency anemia, unspecified: Secondary | ICD-10-CM | POA: Diagnosis not present

## 2020-07-26 DIAGNOSIS — N186 End stage renal disease: Secondary | ICD-10-CM | POA: Diagnosis not present

## 2020-07-26 DIAGNOSIS — I132 Hypertensive heart and chronic kidney disease with heart failure and with stage 5 chronic kidney disease, or end stage renal disease: Secondary | ICD-10-CM | POA: Diagnosis not present

## 2020-07-26 DIAGNOSIS — I509 Heart failure, unspecified: Secondary | ICD-10-CM | POA: Diagnosis not present

## 2020-07-26 DIAGNOSIS — Z4801 Encounter for change or removal of surgical wound dressing: Secondary | ICD-10-CM | POA: Diagnosis not present

## 2020-07-26 DIAGNOSIS — E1152 Type 2 diabetes mellitus with diabetic peripheral angiopathy with gangrene: Secondary | ICD-10-CM | POA: Diagnosis not present

## 2020-07-26 DIAGNOSIS — D631 Anemia in chronic kidney disease: Secondary | ICD-10-CM | POA: Diagnosis not present

## 2020-07-26 DIAGNOSIS — Z992 Dependence on renal dialysis: Secondary | ICD-10-CM | POA: Diagnosis not present

## 2020-07-26 DIAGNOSIS — E1122 Type 2 diabetes mellitus with diabetic chronic kidney disease: Secondary | ICD-10-CM | POA: Diagnosis not present

## 2020-07-27 ENCOUNTER — Ambulatory Visit: Payer: Medicare Other | Admitting: *Deleted

## 2020-07-27 ENCOUNTER — Other Ambulatory Visit: Payer: Self-pay

## 2020-07-27 ENCOUNTER — Encounter: Payer: Medicare Other | Attending: Registered Nurse | Admitting: Registered Nurse

## 2020-07-27 ENCOUNTER — Encounter: Payer: Self-pay | Admitting: Registered Nurse

## 2020-07-27 VITALS — BP 130/67 | HR 88 | Temp 98.5°F | Ht 63.0 in

## 2020-07-27 DIAGNOSIS — I10 Essential (primary) hypertension: Secondary | ICD-10-CM

## 2020-07-27 DIAGNOSIS — Z794 Long term (current) use of insulin: Secondary | ICD-10-CM | POA: Diagnosis not present

## 2020-07-27 DIAGNOSIS — S88112A Complete traumatic amputation at level between knee and ankle, left lower leg, initial encounter: Secondary | ICD-10-CM

## 2020-07-27 DIAGNOSIS — N185 Chronic kidney disease, stage 5: Secondary | ICD-10-CM | POA: Diagnosis not present

## 2020-07-27 DIAGNOSIS — E1122 Type 2 diabetes mellitus with diabetic chronic kidney disease: Secondary | ICD-10-CM

## 2020-07-27 DIAGNOSIS — N2889 Other specified disorders of kidney and ureter: Secondary | ICD-10-CM

## 2020-07-27 DIAGNOSIS — N186 End stage renal disease: Secondary | ICD-10-CM

## 2020-07-27 DIAGNOSIS — I151 Hypertension secondary to other renal disorders: Secondary | ICD-10-CM

## 2020-07-27 DIAGNOSIS — J4530 Mild persistent asthma, uncomplicated: Secondary | ICD-10-CM

## 2020-07-27 DIAGNOSIS — Z992 Dependence on renal dialysis: Secondary | ICD-10-CM | POA: Diagnosis not present

## 2020-07-27 DIAGNOSIS — E1165 Type 2 diabetes mellitus with hyperglycemia: Secondary | ICD-10-CM | POA: Diagnosis not present

## 2020-07-27 MED ORDER — TRAMADOL HCL 50 MG PO TABS: 50.0000 mg | ORAL_TABLET | Freq: Two times a day (BID) | ORAL | 1 refills | Status: DC | PRN

## 2020-07-27 MED ORDER — GABAPENTIN 300 MG PO CAPS
300.0000 mg | ORAL_CAPSULE | Freq: Every day | ORAL | 1 refills | Status: AC
Start: 2020-07-27 — End: ?

## 2020-07-27 NOTE — Chronic Care Management (AMB) (Signed)
Chronic Care Management   CCM RN Visit Note  07/27/2020 Name: Christy Zhang MRN: [id number] DOB: [birth year]  Subjective: Christy Zhang is a 65 y.o. year old female who is a primary care patient of [name], [name], MD. The care management team was consulted for assistance with disease management and care coordination needs.    Engaged with patient by telephone for follow up visit in response to provider referral for case management and/or care coordination services.   Consent to Services:  The patient was given information about Chronic Care Management services, agreed to services, and gave verbal consent prior to initiation of services.  Please see initial visit note for detailed documentation.   Patient agreed to services and verbal consent obtained.   Assessment: Review of patient past medical history, allergies, medications, health status, including review of consultants reports, laboratory and other test data, was performed as part of comprehensive evaluation and provision of chronic care management services.   SDOH (Social Determinants of Health) assessments and interventions performed:    CCM Care Plan  Allergies  Allergen Reactions  . Sulfa Antibiotics Swelling    Outpatient Encounter Medications as of 07/27/2020  Medication Sig  . acetaminophen (TYLENOL) 325 MG tablet Take 1-2 tablets (325-650 mg total) by mouth every 4 (four) hours as needed for mild pain.  Marland Kitchen amLODipine (NORVASC) 5 MG tablet Take 1 tablet (5 mg total) by mouth at bedtime.  Marland Kitchen aspirin EC 81 MG tablet Take 81 mg by mouth at bedtime. Swallow whole.  Marland Kitchen atorvastatin (LIPITOR) 40 MG tablet Take 1 tablet (40 mg total) by mouth daily. (Patient taking differently: Take 40 mg by mouth at bedtime.)  . cephALEXin (KEFLEX) 500 MG capsule Take 1 capsule (500 mg total) by mouth 2 (two) times daily for 10 days.  . clopidogrel (PLAVIX) 75 MG tablet Take 1 tablet (75 mg total) by mouth daily.  . diclofenac Sodium  (VOLTAREN) 1 % GEL Apply 2 g topically 4 (four) times daily.  . diphenhydrAMINE (BENADRYL) 25 MG tablet Take 25 mg by mouth every 6 (six) hours as needed for itching.  . Dulaglutide (TRULICITY) 1.5 0000000 SOPN Inject 1.5 mg into the skin once a week. Saturday  . gabapentin (NEURONTIN) 300 MG capsule Take 1 capsule (300 mg total) by mouth at bedtime.  Marland Kitchen HYDROcodone-acetaminophen (NORCO/VICODIN) 5-325 MG tablet Take 1 tablet by mouth every 12 (twelve) hours as needed for severe pain.  Marland Kitchen insulin glargine, 1 Unit Dial, (TOUJEO) 300 UNIT/ML Solostar Pen Inject 16 Units into the skin daily.  . metoprolol succinate (TOPROL-XL) 50 MG 24 hr tablet Take 1 tablet (50 mg total) by mouth daily. Take with or immediately following a meal.  . multivitamin (RENA-VIT) TABS tablet Take 1 tablet by mouth at bedtime.   . Nutritional Supplements (,FEEDING SUPPLEMENT, PROSOURCE PLUS) liquid Take 30 mLs by mouth 2 (two) times daily between meals.  . polyethylene glycol (MIRALAX / GLYCOLAX) 17 g packet Take 17 g by mouth daily.  Marland Kitchen senna-docusate (SENOKOT-S) 8.6-50 MG tablet Take 1 tablet by mouth 2 (two) times daily.  . sevelamer carbonate (RENVELA) 800 MG tablet Take 1,600-2,400 mg by mouth See admin instructions. Take 2400 mg with each meal and 1600 mg with each snack  . traMADol (ULTRAM) 50 MG tablet Take 1 tablet (50 mg total) by mouth every 12 (twelve) hours as needed for moderate pain.   No facility-administered encounter medications on file as of 07/27/2020.    Patient Active Problem List   Diagnosis  Date Noted  . Leucocytosis 07/05/2020  . Below-knee amputation of left lower extremity ([place]) 06/16/2020  . S/P BKA (below knee amputation) unilateral ([place]) 06/08/2020  . Abdominal wall cellulitis   . Panniculitis 12/29/2019  . Osteomyelitis ([place]) 10/15/2019  . Osteomyelitis of third toe of right foot ([place]) 10/14/2019  . Controlled type 2 diabetes mellitus with chronic kidney disease on chronic dialysis, with  long-term current use of insulin ([place]) 12/02/2016  . ESRD (end stage renal disease) ([place])   . Asthma, mild persistent 07/25/2015  . Hypertension secondary to other renal disorders 07/25/2015  . Anemia in chronic kidney disease 02/17/2015  . 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 Essential (primary) hypertension 06/24/2014  . Heart failure ([place]) 06/24/2014  . Obesity, Class III, BMI 40-49.9 (morbid obesity) ([place]) 06/24/2014    Conditions to be addressed/monitored:HTN, CHF, asthma, DM, ESRD, HLD, Left BKA, anemia in CKD  Care Plan : RNCM:Diabetes Type 2 (Adult)  Updates made by Ilean China, RN since 07/27/2020 12:00 AM    Problem: Glycemic Management (Diabetes, Type 2)   Priority: Medium    Long-Range Goal: Glycemic Management Optimized   Start Date: 07/27/2020  This Visit's Progress: On track  Priority: Medium  Note:   Objective: Lab Results  Component Value Date   HGBA1C 5.8 (H) 06/08/2020   HGBA1C 5.4 04/06/2020   HGBA1C 6.2 01/06/2020   Lab Results  Component Value Date   LDLCALC 35 07/20/2020   CREATININE 7.95 ([place]) 07/20/2020   Current Barriers:  . Chronic Disease Management support and education needs related to diabetes . Unable to independently drive  Nurse Case Manager Clinical Goal(s):  . patient will work with PCP to address needs related to medical management of diabetes . patient will meet with RN Care Manager to address self-management of diabetes . Patient will demonstrate ongoing self-care management as evidenced by checking and record blood sugar readings twice a day  Interventions:  . 1:1 collaboration with Claretta Fraise, MD regarding development and update of comprehensive plan of care as evidenced by provider attestation and co-signature . Inter-disciplinary care team collaboration (see longitudinal plan of care) . Evaluation of current treatment plan related to diabetes and patient's adherence to plan as  established by provider. . Chart reviewed including relevant office notes and lab results o Discussed recent A1C results and how it is low - Has talked with provider about potentially stopping insulin injections if blood sugars remain low . Reinforced need to continue checking and recording blood sugar readings twice a day . Discussed home readings o Averaging around 110 fasting and 150 post prandial o No readings below 70 . Advised to call PCP with any readings below 70 or above 200 . Reviewed upcoming appointments . Discussed diet o DASH diet recommended . Discussed family/social support o Has assistance from her adult daughter . Provided with RN Care manager contact number and encouraged to reach out as needed . Encouraged to reach out to LCSW with psychosocial needs  Patient Goals/Self-Care Activities Over the next 60 days, patient will: . Check and record blood sugar twice daily . Call PCP [phone] with any readings below 70 or above 200 . Eat meals at regular intervals . Follow a DASH diet and limit sugar and simple carbohydrates . Keep all medical appointments . Call RN Care Manager as needed [phone]    Care Plan : RNCM: Hypertension (Adult)  Updates made by Ilean China, RN since 07/27/2020 12:00 AM  Problem: Hypertension (Hypertension)   Priority: High    Long-Range Goal: Hypertension Monitored   Start Date: 07/27/2020  This Visit's Progress: Not on track  Priority: High  Note:   Objective: BP Readings from Last 3 Encounters:  07/25/20 (!) 161/68  07/20/20 (!) 155/75  07/11/20 (!) 174/76   Current Barriers:  . Chronic Disease Management support and education needs related to hypertension . Unable to independently drive . ESRD with dialysis  Nurse Case Manager Clinical Goal(s):  . patient will work with PCP and nephrologist to address needs related to medical management of hypertension . patient will meet with RN Care Manager to address  self-management of hypertension  Interventions:  . 1:1 collaboration with Claretta Fraise, MD regarding development and update of comprehensive plan of care as evidenced by provider attestation and co-signature . Inter-disciplinary care team collaboration (see longitudinal plan of care) . Evaluation of current treatment plan related to hypertension and patient's adherence to plan as established by provider. . Chart reviewed including relevant office notes and lab results . Reviewed recent in-office blood pressure readings. They are consistently high. . Discussed home readings o Patient doesn't check at home herself but home health nurse is checking twice a week for now and it is checked 3 times a week at dialysis . Reviewed and discussed medications o Patient reports being compliant with medications and cost isn't a problem as they are free . Discussed importance of blood pressure control . Recommended DASH diet . Provided with RN Care Manager contact number and encouraged to reach out as needed  Patient Goals/Self-Care Activities Over the next 60 days, patient will: . Check and record blood pressure daily . Call PCP with any readings outside of recommended range . Take medication as prescribed . Limit sodium intake and follow a DASH diet . Call RN Care Manager as needed [phone]      Follow Up Plan:  . Telephone follow up appointment with care management team member scheduled for: 08/15/20 with LCSW, 09/06/20 RNCM . The patient has been provided with contact information for the care management team and has been advised to call with any health related questions or concerns.  . Next PCP appointment scheduled for: 10/18/20 with Dr Livia Snellen . Next physical medicine and rehab TOC visit is scheduled for today

## 2020-07-27 NOTE — Progress Notes (Signed)
Subjective:    Patient ID: Christy Zhang, female    DOB: [birth year], 65 y.o.   MRN: [id number]  HPI: Christy Zhang is a 65 y.o. female who is here for hospital follow up appointment of her Below knee amputation of left lower extremity, Essential Hypertension and ESRD. Ms. Lagow was admitted on 06/08/2020 for left BKA by Dr Stanford Breed. Vascular following.  LEFT BELOW KNEE AMPUTATION on 06/08/2020 by Dr Stanford Breed.   Ms. Richart was admitted to Inpatient Rehabilitation on 06/16/2020 and discharged home on 07/04/2020.She is receiving home health therapy from [facility]. She denies any pain at [facility] time. She rated her pain on the health and history 4. Also reports she has a good appetite.   Daughter in room.  Pain Inventory Average Pain PT DID NOT ANSWER Pain Right Now 4 My pain is aching  In the last 24 hours, has pain interfered with the following? General activity 0 Relation with others 0 Enjoyment of life 0 What TIME of day is your pain at its worst? night Sleep (in general) DID NOT ANSWER  Pain is worse with: walking Pain improves with: medication Relief from Meds: NOT ANSWERED  use a wheelchair needs help with transfers  disabled: date disabled NOT ANSWERED I need assistance with the following:  dressing, bathing, toileting and shopping  trouble walking    Any changes since last visit?  no    Family History  Problem Relation Age of Onset  . Diabetes Mother   . Hypertension Mother   . Cancer Father   . Diabetes Father   . Hypertension Sister   . Diabetes Daughter   . Heart disease Daughter   . Hypertension Daughter   . Diabetes Son   . Heart disease Son   . Hypertension Son   . Peripheral vascular disease Son        amputation   Social History   Socioeconomic History  . Marital status: Widowed    Spouse name: Not on file  . Number of children: Not on file  . Years of education: Not on file  . Highest education level: Not on file  Occupational  History  . Not on file  Tobacco Use  . Smoking status: Former Smoker    Types: Cigarettes    Quit date: 07/25/1984    Years since quitting: 36.0  . Smokeless tobacco: Never Used  Vaping Use  . Vaping Use: Never used  Substance and Sexual Activity  . Alcohol use: No    Alcohol/week: 0.0 standard drinks  . Drug use: No  . Sexual activity: Not on file  Other Topics Concern  . Not on file  Social History Narrative  . Not on file   Social Determinants of Health   Financial Resource Strain: Low Risk   . Difficulty of Paying Living Expenses: Not very hard  Food Insecurity: No Food Insecurity  . Worried About Charity fundraiser in the Last Year: Never true  . Ran Out of Food in the Last Year: Never true  Transportation Needs: No Transportation Needs  . Lack of Transportation (Medical): No  . Lack of Transportation (Non-Medical): No  Physical Activity: Inactive  . Days of Exercise per Week: 0 days  . Minutes of Exercise per Session: 0 min  Stress: No Stress Concern Present  . Feeling of Stress : Only a little  Social Connections: Socially Isolated  . Frequency of Communication with Friends and Family: More than three times a week  .  Frequency of Social Gatherings with Friends and Family: More than three times a week  . Attends Religious Services: Never  . Active Member of Clubs or Organizations: No  . Attends Archivist Meetings: Never  . Marital Status: Widowed   Past Surgical History:  Procedure Laterality Date  .  LEFT BELOW KNEE AMPUTATION (Left Knee)  06/08/2020  . A/V FISTULAGRAM Right 02/04/2017   Procedure: A/V Fistulagram;  Surgeon: Waynetta Sandy, MD;  Location: [facility];  Service: Cardiovascular;  Laterality: Right;  . ABDOMINAL AORTOGRAM W/LOWER EXTREMITY N/A 11/18/2019   Procedure: ABDOMINAL AORTOGRAM W/LOWER EXTREMITY;  Surgeon: [name], MD;  Location: [facility];  Service: Cardiovascular;  Laterality: N/A;  .  ABDOMINAL AORTOGRAM W/LOWER EXTREMITY N/A 02/23/2020   Procedure: ABDOMINAL AORTOGRAM W/LOWER EXTREMITY;  Surgeon: [name], MD;  Location: [facility];  Service: Cardiovascular;  Laterality: N/A;  Bilateral  . AMPUTATION Left 06/08/2020   Procedure: LEFT BELOW KNEE AMPUTATION;  Surgeon: Cherre Robins, MD;  Location: [facility];  Service: Vascular;  Laterality: Left;  . AV FISTULA PLACEMENT Right 08/25/2014   Procedure: RIGHT BRACHIOCEPHALIC ARTERIOVENOUS (AV) FISTULA CREATION;  Surgeon: Mal Misty, MD;  Location: [facility];  Service: Vascular;  Laterality: Right;  . AV FISTULA PLACEMENT Right 03/18/2019   Procedure: INSERTION OF ARTERIOVENOUS (AV) GORE-TEX GRAFT RIGHT ARM;  Surgeon: Serafina Mitchell, MD;  Location: [facility];  Service: Vascular;  Laterality: Right;  . BASCILIC VEIN TRANSPOSITION Right 12/24/2018   Procedure: FIRST STAGE BASILIC VEIN TRANSPOSITION RIGHT UPPER ARM;  Surgeon: Serafina Mitchell, MD;  Location: [facility];  Service: Vascular;  Laterality: Right;  . FISTULA SUPERFICIALIZATION Right 11/03/2014   Procedure: RIGHT UPPER ARM FISTULA SUPERFICIALIZATION;  Surgeon: Mal Misty, MD;  Location: [facility];  Service: Vascular;  Laterality: Right;  . FISTULOGRAM Right 01/16/2016   Procedure: RIGHT UPPER EXTREMITY ARTERIOVENOUS FISTULOGRAM WITH BALLOON ANGIOPLASTY;  Surgeon: Vickie Epley, MD;  Location: [facility];  Service: Vascular;  Laterality: Right;  . INSERTION OF DIALYSIS CATHETER     X4  . IR FLUORO GUIDE CV LINE RIGHT  12/03/2018  . IR REMOVAL TUN CV CATH W/O FL  07/06/2019  . IR THROMBECTOMY AV FISTULA W/THROMBOLYSIS/PTA INC/SHUNT/IMG RIGHT Right 12/03/2018  . IR THROMBECTOMY AV FISTULA W/THROMBOLYSIS/PTA/STENT INC/SHUNT/IMG RT Right 10/11/2019  . IR US GUIDE VASC ACCESS RIGHT  12/03/2018  . IR US GUIDE VASC ACCESS RIGHT  12/03/2018  . IR US GUIDE VASC ACCESS RIGHT  10/14/2019  . LIGATION OF COMPETING BRANCHES OF ARTERIOVENOUS FISTULA Right 11/03/2014   Procedure: LIGATION OF  COMPETING BRANCHE x1 OF RIGHT UPPER ARM ARTERIOVENOUS FISTULA;  Surgeon: Mal Misty, MD;  Location: [facility];  Service: Vascular;  Laterality: Right;  . PERIPHERAL VASCULAR BALLOON ANGIOPLASTY Right 02/04/2017   Procedure: PERIPHERAL VASCULAR BALLOON ANGIOPLASTY;  Surgeon: Waynetta Sandy, MD;  Location: [facility];  Service: Cardiovascular;  Laterality: Right;  . PERIPHERAL VASCULAR BALLOON ANGIOPLASTY Right 11/18/2019   Procedure: PERIPHERAL VASCULAR BALLOON ANGIOPLASTY;  Surgeon: [name], MD;  Location: [facility];  Service: Cardiovascular;  Laterality: Right;  anterior tibial  . PERIPHERAL VASCULAR BALLOON ANGIOPLASTY  02/23/2020   Procedure: PERIPHERAL VASCULAR BALLOON ANGIOPLASTY;  Surgeon: [name], MD;  Location: [facility];  Service: Cardiovascular;;  Lt  AT  . TRANSMETATARSAL AMPUTATION Left 02/24/2020   Procedure: TRANSMETATARSAL AMPUTATION LEFT;  Surgeon: Cherre Robins, MD;  Location: [facility];  Service: Vascular;  Laterality: Left;  . TUBAL LIGATION     Past Medical History:  Diagnosis Date  . Anemia   . Arthritis   . Asthma    in the past  . CHF (congestive heart failure) ([place])   . Chronic kidney disease    Renal failure, dialysis on M/W/F  . 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 End stage renal disease ([place])   . End stage renal disease on dialysis ([place])   . Gangrene ([place])   . Hemodialysis access, arteriovenous graft ([place])   . Hyperglycemia due to diabetes mellitus ([place]) 10/15/2019  . Hyperlipidemia   . Hypertension   . Neuromuscular disorder (HCC)    BP 130/67   Pulse 88   Temp 98.5 F (36.9 C)   Ht '5\' 3"'$  (1.6 m)   LMP  (LMP Unknown)   SpO2 (!) 82%   BMI 41.63 kg/m   Opioid Risk Score:   Fall Risk Score:  `1  Depression screen PHQ 2/9  Depression screen [facility] 2/9 07/27/2020 07/27/2020 07/20/2020 04/06/2020 01/20/2020 01/13/2020 01/06/2020  Decreased Interest 0 0 0 0 0 0 0  Down, Depressed,  Hopeless 0 0 0 0 0 0 0  PHQ - 2 Score 0 0 0 0 0 0 0  Altered sleeping 0 - - - - - -  Tired, decreased energy 0 - - - - - -  Change in appetite 0 - - - - - -  Feeling bad or failure about yourself  0 - - - - - -  Trouble concentrating 0 - - - - - -  Moving slowly or fidgety/restless 0 - - - - - -  Suicidal thoughts 0 - - - - - -  PHQ-9 Score 0 - - - - - -  Difficult doing work/chores Not difficult at all - - - - - -      Review of Systems  Constitutional: Negative.   HENT: Negative.   Eyes: Negative.   Respiratory: Negative.   Cardiovascular: Negative.   Gastrointestinal: Negative.   Endocrine:       HIGH BLOOD SUGAR  Genitourinary: Negative.   Musculoskeletal:       RIGHT TOES  Skin: Negative.   Allergic/Immunologic: Negative.   Neurological: Negative.   Hematological: Negative.   Psychiatric/Behavioral: Negative.        Objective:   Physical Exam Vitals and nursing note reviewed.  Constitutional:      Appearance: Normal appearance.  Cardiovascular:     Rate and Rhythm: Normal rate and regular rhythm.     Pulses: Normal pulses.     Heart sounds: Normal heart sounds.  Pulmonary:     Effort: Pulmonary effort is normal.     Breath sounds: Normal breath sounds.  Musculoskeletal:     Cervical back: Normal range of motion and neck supple.     Comments: Normal Muscle Bulk and Muscle Testing Reveals:  Upper Extremities:Full  ROM and Muscle Strength 5/5  Lower Extremities  Right: Full ROM and Muscle Strength 5/5 Left Lower Extremity: BKA Arrived in wheelchair  Skin:    General: Skin is warm and dry.  Neurological:     Mental Status: She is alert and oriented to person, place, and time.  Psychiatric:        Mood and Affect: Mood normal.        Behavior: Behavior normal.           Assessment &  Plan:   1. Below knee amputation of left lower extremity: Continue Home Health Therapy with [facility]: Vascular Following.  2. Essential Hypertension:  Continue current medication regimen: PCP Following.  3. ESRD: On Hemodialysis: Nephrology Following.   F/U with Dr Dagoberto Ligas in 4-6 weeks .

## 2020-07-27 NOTE — Patient Instructions (Signed)
Visit Information  PATIENT GOALS: Goals Addressed            This Visit's Progress   . Monitor and Manage My Blood Sugar-Diabetes Type 2   On track    Timeframe:  Long-Range Goal Priority:  Medium Start Date:  07/27/20                           Expected End Date: 07/27/21                      Follow Up Date 09/06/20   . Check and record blood sugar twice daily . Call PCP [phone] with any readings below 70 or above 200 . Eat meals at regular intervals . Follow a DASH diet and limit sugar and simple carbohydrates . Keep all medical appointments . Call RN Care Manager as needed [phone]    Why is this important?    Checking your blood sugar at home helps to keep it from getting very high or very low.   Writing the results in a diary or log helps the doctor know how to care for you.   Your blood sugar log should have the time, date and the results.   Also, write down the amount of insulin or other medicine that you take.   Other information, like what you ate, exercise done and how you were feeling, will also be helpful.     Notes:     . Track and Manage My Blood Pressure-Hypertension   Not on track    Timeframe:  Long-Range Goal Priority:  High Start Date:   07/27/20                          Expected End Date:   07/27/21                    Follow Up Date 09/06/20   . Check and record blood pressure daily . Call PCP with any readings outside of recommended range . Take medication as prescribed . Limit sodium intake and follow a DASH diet . Call RN Care Manager as needed [phone]   Why is this important?    You won't feel high blood pressure, but it can still hurt your blood vessels.   High blood pressure can cause heart or kidney problems. It can also cause a stroke.   Making lifestyle changes like losing a little weight or eating less salt will help.   Checking your blood pressure at home and at different times of the day can help to control blood  pressure.   If the doctor prescribes medicine remember to take it the way the doctor ordered.   Call the office if you cannot afford the medicine or if there are questions about it.     Notes:         Patient verbalizes understanding of instructions provided today and agrees to view in [place].    Follow Up Plan:  . Telephone follow up appointment with care management team member scheduled for: 08/15/20 with LCSW, 09/06/20 RNCM . The patient has been provided with contact information for the care management team and has been advised to call with any health related questions or concerns.  . Next PCP appointment scheduled for: 10/18/20 with Dr Livia Snellen . Next physical medicine and rehab TOC visit is scheduled for today  Chong Sicilian, BSN, RN-BC Embedded  Chronic Care Manager [place] [facility] [place] Management Direct Dial: [phone]

## 2020-07-28 DIAGNOSIS — I132 Hypertensive heart and chronic kidney disease with heart failure and with stage 5 chronic kidney disease, or end stage renal disease: Secondary | ICD-10-CM | POA: Diagnosis not present

## 2020-07-28 DIAGNOSIS — I509 Heart failure, unspecified: Secondary | ICD-10-CM | POA: Diagnosis not present

## 2020-07-28 DIAGNOSIS — D509 Iron deficiency anemia, unspecified: Secondary | ICD-10-CM | POA: Diagnosis not present

## 2020-07-28 DIAGNOSIS — E1122 Type 2 diabetes mellitus with diabetic chronic kidney disease: Secondary | ICD-10-CM | POA: Diagnosis not present

## 2020-07-28 DIAGNOSIS — N186 End stage renal disease: Secondary | ICD-10-CM | POA: Diagnosis not present

## 2020-07-28 DIAGNOSIS — E1152 Type 2 diabetes mellitus with diabetic peripheral angiopathy with gangrene: Secondary | ICD-10-CM | POA: Diagnosis not present

## 2020-07-28 DIAGNOSIS — D631 Anemia in chronic kidney disease: Secondary | ICD-10-CM | POA: Diagnosis not present

## 2020-07-28 DIAGNOSIS — Z992 Dependence on renal dialysis: Secondary | ICD-10-CM | POA: Diagnosis not present

## 2020-07-28 DIAGNOSIS — Z4781 Encounter for orthopedic aftercare following surgical amputation: Secondary | ICD-10-CM | POA: Diagnosis not present

## 2020-07-28 DIAGNOSIS — Z4801 Encounter for change or removal of surgical wound dressing: Secondary | ICD-10-CM | POA: Diagnosis not present

## 2020-07-28 DIAGNOSIS — E559 Vitamin D deficiency, unspecified: Secondary | ICD-10-CM | POA: Diagnosis not present

## 2020-07-28 DIAGNOSIS — N2581 Secondary hyperparathyroidism of renal origin: Secondary | ICD-10-CM | POA: Diagnosis not present

## 2020-07-31 DIAGNOSIS — D631 Anemia in chronic kidney disease: Secondary | ICD-10-CM | POA: Diagnosis not present

## 2020-07-31 DIAGNOSIS — Z992 Dependence on renal dialysis: Secondary | ICD-10-CM | POA: Diagnosis not present

## 2020-07-31 DIAGNOSIS — N2581 Secondary hyperparathyroidism of renal origin: Secondary | ICD-10-CM | POA: Diagnosis not present

## 2020-07-31 DIAGNOSIS — N186 End stage renal disease: Secondary | ICD-10-CM | POA: Diagnosis not present

## 2020-07-31 DIAGNOSIS — E559 Vitamin D deficiency, unspecified: Secondary | ICD-10-CM | POA: Diagnosis not present

## 2020-07-31 DIAGNOSIS — D509 Iron deficiency anemia, unspecified: Secondary | ICD-10-CM | POA: Diagnosis not present

## 2020-08-01 DIAGNOSIS — I132 Hypertensive heart and chronic kidney disease with heart failure and with stage 5 chronic kidney disease, or end stage renal disease: Secondary | ICD-10-CM | POA: Diagnosis not present

## 2020-08-01 DIAGNOSIS — Z4801 Encounter for change or removal of surgical wound dressing: Secondary | ICD-10-CM | POA: Diagnosis not present

## 2020-08-01 DIAGNOSIS — Z4781 Encounter for orthopedic aftercare following surgical amputation: Secondary | ICD-10-CM | POA: Diagnosis not present

## 2020-08-01 DIAGNOSIS — I509 Heart failure, unspecified: Secondary | ICD-10-CM | POA: Diagnosis not present

## 2020-08-01 DIAGNOSIS — E1122 Type 2 diabetes mellitus with diabetic chronic kidney disease: Secondary | ICD-10-CM | POA: Diagnosis not present

## 2020-08-01 DIAGNOSIS — E1152 Type 2 diabetes mellitus with diabetic peripheral angiopathy with gangrene: Secondary | ICD-10-CM | POA: Diagnosis not present

## 2020-08-02 ENCOUNTER — Other Ambulatory Visit: Payer: Self-pay

## 2020-08-02 DIAGNOSIS — E1122 Type 2 diabetes mellitus with diabetic chronic kidney disease: Secondary | ICD-10-CM | POA: Diagnosis not present

## 2020-08-02 DIAGNOSIS — Z4801 Encounter for change or removal of surgical wound dressing: Secondary | ICD-10-CM | POA: Diagnosis not present

## 2020-08-02 DIAGNOSIS — E559 Vitamin D deficiency, unspecified: Secondary | ICD-10-CM | POA: Diagnosis not present

## 2020-08-02 DIAGNOSIS — E1152 Type 2 diabetes mellitus with diabetic peripheral angiopathy with gangrene: Secondary | ICD-10-CM | POA: Diagnosis not present

## 2020-08-02 DIAGNOSIS — I509 Heart failure, unspecified: Secondary | ICD-10-CM | POA: Diagnosis not present

## 2020-08-02 DIAGNOSIS — D509 Iron deficiency anemia, unspecified: Secondary | ICD-10-CM | POA: Diagnosis not present

## 2020-08-02 DIAGNOSIS — N186 End stage renal disease: Secondary | ICD-10-CM | POA: Diagnosis not present

## 2020-08-02 DIAGNOSIS — D631 Anemia in chronic kidney disease: Secondary | ICD-10-CM | POA: Diagnosis not present

## 2020-08-02 DIAGNOSIS — Z4781 Encounter for orthopedic aftercare following surgical amputation: Secondary | ICD-10-CM | POA: Diagnosis not present

## 2020-08-02 DIAGNOSIS — N2581 Secondary hyperparathyroidism of renal origin: Secondary | ICD-10-CM | POA: Diagnosis not present

## 2020-08-02 DIAGNOSIS — I132 Hypertensive heart and chronic kidney disease with heart failure and with stage 5 chronic kidney disease, or end stage renal disease: Secondary | ICD-10-CM | POA: Diagnosis not present

## 2020-08-02 DIAGNOSIS — Z992 Dependence on renal dialysis: Secondary | ICD-10-CM | POA: Diagnosis not present

## 2020-08-04 DIAGNOSIS — I132 Hypertensive heart and chronic kidney disease with heart failure and with stage 5 chronic kidney disease, or end stage renal disease: Secondary | ICD-10-CM | POA: Diagnosis not present

## 2020-08-04 DIAGNOSIS — Z87891 Personal history of nicotine dependence: Secondary | ICD-10-CM | POA: Diagnosis not present

## 2020-08-04 DIAGNOSIS — G709 Myoneural disorder, unspecified: Secondary | ICD-10-CM | POA: Diagnosis not present

## 2020-08-04 DIAGNOSIS — Z4781 Encounter for orthopedic aftercare following surgical amputation: Secondary | ICD-10-CM | POA: Diagnosis not present

## 2020-08-04 DIAGNOSIS — Z79899 Other long term (current) drug therapy: Secondary | ICD-10-CM | POA: Diagnosis not present

## 2020-08-04 DIAGNOSIS — E785 Hyperlipidemia, unspecified: Secondary | ICD-10-CM | POA: Diagnosis not present

## 2020-08-04 DIAGNOSIS — Z993 Dependence on wheelchair: Secondary | ICD-10-CM | POA: Diagnosis not present

## 2020-08-04 DIAGNOSIS — E559 Vitamin D deficiency, unspecified: Secondary | ICD-10-CM | POA: Diagnosis not present

## 2020-08-04 DIAGNOSIS — I509 Heart failure, unspecified: Secondary | ICD-10-CM | POA: Diagnosis not present

## 2020-08-04 DIAGNOSIS — N186 End stage renal disease: Secondary | ICD-10-CM | POA: Diagnosis not present

## 2020-08-04 DIAGNOSIS — Z794 Long term (current) use of insulin: Secondary | ICD-10-CM | POA: Diagnosis not present

## 2020-08-04 DIAGNOSIS — Z79891 Long term (current) use of opiate analgesic: Secondary | ICD-10-CM | POA: Diagnosis not present

## 2020-08-04 DIAGNOSIS — E8809 Other disorders of plasma-protein metabolism, not elsewhere classified: Secondary | ICD-10-CM | POA: Diagnosis not present

## 2020-08-04 DIAGNOSIS — Z89522 Acquired absence of left knee: Secondary | ICD-10-CM | POA: Diagnosis not present

## 2020-08-04 DIAGNOSIS — J45909 Unspecified asthma, uncomplicated: Secondary | ICD-10-CM | POA: Diagnosis not present

## 2020-08-04 DIAGNOSIS — E1122 Type 2 diabetes mellitus with diabetic chronic kidney disease: Secondary | ICD-10-CM | POA: Diagnosis not present

## 2020-08-04 DIAGNOSIS — Z7982 Long term (current) use of aspirin: Secondary | ICD-10-CM | POA: Diagnosis not present

## 2020-08-04 DIAGNOSIS — Z4801 Encounter for change or removal of surgical wound dressing: Secondary | ICD-10-CM | POA: Diagnosis not present

## 2020-08-04 DIAGNOSIS — D631 Anemia in chronic kidney disease: Secondary | ICD-10-CM | POA: Diagnosis not present

## 2020-08-04 DIAGNOSIS — N2581 Secondary hyperparathyroidism of renal origin: Secondary | ICD-10-CM | POA: Diagnosis not present

## 2020-08-04 DIAGNOSIS — D509 Iron deficiency anemia, unspecified: Secondary | ICD-10-CM | POA: Diagnosis not present

## 2020-08-04 DIAGNOSIS — M1990 Unspecified osteoarthritis, unspecified site: Secondary | ICD-10-CM | POA: Diagnosis not present

## 2020-08-04 DIAGNOSIS — E1152 Type 2 diabetes mellitus with diabetic peripheral angiopathy with gangrene: Secondary | ICD-10-CM | POA: Diagnosis not present

## 2020-08-04 DIAGNOSIS — Z992 Dependence on renal dialysis: Secondary | ICD-10-CM | POA: Diagnosis not present

## 2020-08-04 DIAGNOSIS — Z7902 Long term (current) use of antithrombotics/antiplatelets: Secondary | ICD-10-CM | POA: Diagnosis not present

## 2020-08-07 DIAGNOSIS — N2581 Secondary hyperparathyroidism of renal origin: Secondary | ICD-10-CM | POA: Diagnosis not present

## 2020-08-07 DIAGNOSIS — N186 End stage renal disease: Secondary | ICD-10-CM | POA: Diagnosis not present

## 2020-08-07 DIAGNOSIS — Z992 Dependence on renal dialysis: Secondary | ICD-10-CM | POA: Diagnosis not present

## 2020-08-07 DIAGNOSIS — E559 Vitamin D deficiency, unspecified: Secondary | ICD-10-CM | POA: Diagnosis not present

## 2020-08-07 DIAGNOSIS — D631 Anemia in chronic kidney disease: Secondary | ICD-10-CM | POA: Diagnosis not present

## 2020-08-07 DIAGNOSIS — D509 Iron deficiency anemia, unspecified: Secondary | ICD-10-CM | POA: Diagnosis not present

## 2020-08-08 ENCOUNTER — Other Ambulatory Visit (HOSPITAL_COMMUNITY): Payer: Medicare Other

## 2020-08-08 ENCOUNTER — Other Ambulatory Visit: Payer: Self-pay

## 2020-08-08 ENCOUNTER — Emergency Department (HOSPITAL_COMMUNITY): Payer: Medicare Other

## 2020-08-08 ENCOUNTER — Telehealth: Payer: Self-pay

## 2020-08-08 ENCOUNTER — Inpatient Hospital Stay (HOSPITAL_COMMUNITY)
Admission: EM | Admit: 2020-08-08 | Discharge: 2020-08-18 | DRG: 239 | Disposition: A | Discharge status: Home Health | Payer: Medicare Other | Attending: Internal Medicine | Admitting: Internal Medicine

## 2020-08-08 ENCOUNTER — Encounter (HOSPITAL_COMMUNITY): Payer: Self-pay

## 2020-08-08 DIAGNOSIS — Z20822 Contact with and (suspected) exposure to covid-19: Secondary | ICD-10-CM | POA: Diagnosis present

## 2020-08-08 DIAGNOSIS — Z4801 Encounter for change or removal of surgical wound dressing: Secondary | ICD-10-CM | POA: Diagnosis not present

## 2020-08-08 DIAGNOSIS — E11628 Type 2 diabetes mellitus with other skin complications: Secondary | ICD-10-CM | POA: Diagnosis present

## 2020-08-08 DIAGNOSIS — M869 Osteomyelitis, unspecified: Secondary | ICD-10-CM | POA: Diagnosis not present

## 2020-08-08 DIAGNOSIS — Z7982 Long term (current) use of aspirin: Secondary | ICD-10-CM

## 2020-08-08 DIAGNOSIS — I739 Peripheral vascular disease, unspecified: Secondary | ICD-10-CM | POA: Diagnosis not present

## 2020-08-08 DIAGNOSIS — Z5181 Encounter for therapeutic drug level monitoring: Secondary | ICD-10-CM | POA: Diagnosis not present

## 2020-08-08 DIAGNOSIS — E11621 Type 2 diabetes mellitus with foot ulcer: Secondary | ICD-10-CM | POA: Diagnosis present

## 2020-08-08 DIAGNOSIS — M86471 Chronic osteomyelitis with draining sinus, right ankle and foot: Secondary | ICD-10-CM | POA: Diagnosis not present

## 2020-08-08 DIAGNOSIS — L89322 Pressure ulcer of left buttock, stage 2: Secondary | ICD-10-CM | POA: Diagnosis present

## 2020-08-08 DIAGNOSIS — I10 Essential (primary) hypertension: Secondary | ICD-10-CM | POA: Diagnosis not present

## 2020-08-08 DIAGNOSIS — E1129 Type 2 diabetes mellitus with other diabetic kidney complication: Secondary | ICD-10-CM | POA: Diagnosis not present

## 2020-08-08 DIAGNOSIS — D631 Anemia in chronic kidney disease: Secondary | ICD-10-CM | POA: Diagnosis present

## 2020-08-08 DIAGNOSIS — D75838 Other thrombocytosis: Secondary | ICD-10-CM | POA: Diagnosis present

## 2020-08-08 DIAGNOSIS — Z6841 Body Mass Index (BMI) 40.0 and over, adult: Secondary | ICD-10-CM

## 2020-08-08 DIAGNOSIS — Z87891 Personal history of nicotine dependence: Secondary | ICD-10-CM

## 2020-08-08 DIAGNOSIS — M86671 Other chronic osteomyelitis, right ankle and foot: Secondary | ICD-10-CM | POA: Diagnosis present

## 2020-08-08 DIAGNOSIS — Z8249 Family history of ischemic heart disease and other diseases of the circulatory system: Secondary | ICD-10-CM

## 2020-08-08 DIAGNOSIS — Z7902 Long term (current) use of antithrombotics/antiplatelets: Secondary | ICD-10-CM

## 2020-08-08 DIAGNOSIS — N2581 Secondary hyperparathyroidism of renal origin: Secondary | ICD-10-CM | POA: Diagnosis present

## 2020-08-08 DIAGNOSIS — L089 Local infection of the skin and subcutaneous tissue, unspecified: Secondary | ICD-10-CM

## 2020-08-08 DIAGNOSIS — I70268 Atherosclerosis of native arteries of extremities with gangrene, other extremity: Secondary | ICD-10-CM | POA: Diagnosis present

## 2020-08-08 DIAGNOSIS — E871 Hypo-osmolality and hyponatremia: Secondary | ICD-10-CM | POA: Diagnosis not present

## 2020-08-08 DIAGNOSIS — L8990 Pressure ulcer of unspecified site, unspecified stage: Secondary | ICD-10-CM | POA: Insufficient documentation

## 2020-08-08 DIAGNOSIS — Z89512 Acquired absence of left leg below knee: Secondary | ICD-10-CM

## 2020-08-08 DIAGNOSIS — E1152 Type 2 diabetes mellitus with diabetic peripheral angiopathy with gangrene: Principal | ICD-10-CM | POA: Diagnosis present

## 2020-08-08 DIAGNOSIS — E1169 Type 2 diabetes mellitus with other specified complication: Secondary | ICD-10-CM | POA: Diagnosis present

## 2020-08-08 DIAGNOSIS — I96 Gangrene, not elsewhere classified: Secondary | ICD-10-CM | POA: Diagnosis not present

## 2020-08-08 DIAGNOSIS — E1122 Type 2 diabetes mellitus with diabetic chronic kidney disease: Secondary | ICD-10-CM | POA: Diagnosis present

## 2020-08-08 DIAGNOSIS — G8929 Other chronic pain: Secondary | ICD-10-CM | POA: Diagnosis present

## 2020-08-08 DIAGNOSIS — Z882 Allergy status to sulfonamides status: Secondary | ICD-10-CM

## 2020-08-08 DIAGNOSIS — D72829 Elevated white blood cell count, unspecified: Secondary | ICD-10-CM | POA: Diagnosis not present

## 2020-08-08 DIAGNOSIS — Z833 Family history of diabetes mellitus: Secondary | ICD-10-CM

## 2020-08-08 DIAGNOSIS — I120 Hypertensive chronic kidney disease with stage 5 chronic kidney disease or end stage renal disease: Secondary | ICD-10-CM | POA: Diagnosis present

## 2020-08-08 DIAGNOSIS — M79671 Pain in right foot: Secondary | ICD-10-CM | POA: Diagnosis not present

## 2020-08-08 DIAGNOSIS — M86171 Other acute osteomyelitis, right ankle and foot: Secondary | ICD-10-CM | POA: Diagnosis present

## 2020-08-08 DIAGNOSIS — M1990 Unspecified osteoarthritis, unspecified site: Secondary | ICD-10-CM | POA: Diagnosis present

## 2020-08-08 DIAGNOSIS — Z4781 Encounter for orthopedic aftercare following surgical amputation: Secondary | ICD-10-CM | POA: Diagnosis not present

## 2020-08-08 DIAGNOSIS — D75839 Thrombocytosis, unspecified: Secondary | ICD-10-CM

## 2020-08-08 DIAGNOSIS — Z794 Long term (current) use of insulin: Secondary | ICD-10-CM | POA: Diagnosis not present

## 2020-08-08 DIAGNOSIS — N250 Renal osteodystrophy: Secondary | ICD-10-CM | POA: Diagnosis not present

## 2020-08-08 DIAGNOSIS — Z992 Dependence on renal dialysis: Secondary | ICD-10-CM | POA: Diagnosis not present

## 2020-08-08 DIAGNOSIS — L97512 Non-pressure chronic ulcer of other part of right foot with fat layer exposed: Secondary | ICD-10-CM | POA: Diagnosis present

## 2020-08-08 DIAGNOSIS — L03031 Cellulitis of right toe: Secondary | ICD-10-CM | POA: Diagnosis present

## 2020-08-08 DIAGNOSIS — Z79899 Other long term (current) drug therapy: Secondary | ICD-10-CM

## 2020-08-08 DIAGNOSIS — Z89421 Acquired absence of other right toe(s): Secondary | ICD-10-CM | POA: Diagnosis not present

## 2020-08-08 DIAGNOSIS — E785 Hyperlipidemia, unspecified: Secondary | ICD-10-CM | POA: Diagnosis present

## 2020-08-08 DIAGNOSIS — I132 Hypertensive heart and chronic kidney disease with heart failure and with stage 5 chronic kidney disease, or end stage renal disease: Secondary | ICD-10-CM | POA: Diagnosis not present

## 2020-08-08 DIAGNOSIS — N186 End stage renal disease: Secondary | ICD-10-CM | POA: Diagnosis present

## 2020-08-08 DIAGNOSIS — I70261 Atherosclerosis of native arteries of extremities with gangrene, right leg: Secondary | ICD-10-CM | POA: Diagnosis present

## 2020-08-08 DIAGNOSIS — I509 Heart failure, unspecified: Secondary | ICD-10-CM | POA: Diagnosis not present

## 2020-08-08 DIAGNOSIS — Z8616 Personal history of COVID-19: Secondary | ICD-10-CM | POA: Diagnosis not present

## 2020-08-08 DIAGNOSIS — Z809 Family history of malignant neoplasm, unspecified: Secondary | ICD-10-CM

## 2020-08-08 LAB — CBC WITH DIFFERENTIAL/PLATELET
Abs Immature Granulocytes: 0.15 10*3/uL — ABNORMAL HIGH (ref 0.00–0.07)
Basophils Absolute: 0.1 10*3/uL (ref 0.0–0.1)
Basophils Relative: 1 %
Eosinophils Absolute: 0.7 10*3/uL — ABNORMAL HIGH (ref 0.0–0.5)
Eosinophils Relative: 4 %
HCT: 31.3 % — ABNORMAL LOW (ref 36.0–46.0)
Hemoglobin: 9.2 g/dL — ABNORMAL LOW (ref 12.0–15.0)
Immature Granulocytes: 1 %
Lymphocytes Relative: 11 %
Lymphs Abs: 2 10*3/uL (ref 0.7–4.0)
MCH: 24.9 pg — ABNORMAL LOW (ref 26.0–34.0)
MCHC: 29.4 g/dL — ABNORMAL LOW (ref 30.0–36.0)
MCV: 84.6 fL (ref 80.0–100.0)
Monocytes Absolute: 1.1 10*3/uL — ABNORMAL HIGH (ref 0.1–1.0)
Monocytes Relative: 6 %
Neutro Abs: 14.3 10*3/uL — ABNORMAL HIGH (ref 1.7–7.7)
Neutrophils Relative %: 77 %
Platelets: 894 10*3/uL — ABNORMAL HIGH (ref 150–400)
RBC: 3.7 MIL/uL — ABNORMAL LOW (ref 3.87–5.11)
RDW: 18.2 % — ABNORMAL HIGH (ref 11.5–15.5)
WBC: 18.3 10*3/uL — ABNORMAL HIGH (ref 4.0–10.5)
nRBC: 0 % (ref 0.0–0.2)

## 2020-08-08 LAB — COMPREHENSIVE METABOLIC PANEL
ALT: 7 U/L (ref 0–44)
AST: 10 U/L — ABNORMAL LOW (ref 15–41)
Albumin: 2.6 g/dL — ABNORMAL LOW (ref 3.5–5.0)
Alkaline Phosphatase: 63 U/L (ref 38–126)
Anion gap: 14 (ref 5–15)
BUN: 20 mg/dL (ref 8–23)
CO2: 26 mmol/L (ref 22–32)
Calcium: 8.9 mg/dL (ref 8.9–10.3)
Chloride: 96 mmol/L — ABNORMAL LOW (ref 98–111)
Creatinine, Ser: 7.67 mg/dL — ABNORMAL HIGH (ref 0.44–1.00)
GFR, Estimated: 5 mL/min — ABNORMAL LOW (ref >60)
Glucose, Bld: 166 mg/dL — ABNORMAL HIGH (ref 70–99)
Potassium: 4.7 mmol/L (ref 3.5–5.1)
Sodium: 136 mmol/L (ref 135–145)
Total Bilirubin: 0.6 mg/dL (ref 0.3–1.2)
Total Protein: 8 g/dL (ref 6.5–8.1)

## 2020-08-08 LAB — MAGNESIUM: Magnesium: 1.9 mg/dL (ref 1.7–2.4)

## 2020-08-08 LAB — LACTIC ACID, PLASMA: Lactic Acid, Venous: 1.4 mmol/L (ref 0.5–1.9)

## 2020-08-08 MED ORDER — SODIUM CHLORIDE 0.9 % IV SOLN: 2.0000 g | INTRAVENOUS | Status: DC

## 2020-08-08 MED ORDER — SODIUM CHLORIDE 0.9 % IV SOLN
2.0000 g | Freq: Once | INTRAVENOUS | Status: AC
  Administered 2020-08-08: 2 g via INTRAVENOUS
  Filled 2020-08-08: qty 2

## 2020-08-08 MED ORDER — VANCOMYCIN HCL 1000 MG/200ML IV SOLN: 1000.0000 mg | Freq: Once | INTRAVENOUS | Status: DC

## 2020-08-08 MED ORDER — VANCOMYCIN HCL 2000 MG/400ML IV SOLN
2000.0000 mg | Freq: Once | INTRAVENOUS | Status: AC
  Administered 2020-08-08: 2000 mg via INTRAVENOUS
  Filled 2020-08-08: qty 400

## 2020-08-08 MED ORDER — VANCOMYCIN HCL 1000 MG/200ML IV SOLN
1000.0000 mg | INTRAVENOUS | Status: DC
  Filled 2020-08-08: qty 200

## 2020-08-08 NOTE — ED Provider Notes (Signed)
[facility] EMERGENCY DEPARTMENT Provider Note   CSN: [id number] Arrival date & time: 08/08/20  1723     History Chief Complaint  Patient presents with  . Foot Pain    Christy Zhang is a 65 y.o. female history of CKD on dialysis (last HD was yesterday), diabetes, known peripheral vascular disease status post left BKA, here presenting with right foot pain and redness.  Patient had left BKA about a month ago by Dr. Luan Pulling from vascular.  She has been followed with vascular clinic.  She states that about 2 weeks ago, she was seen at the clinic.  She was thought to have dry gangrene of the right third toe.  She had a bedside debridement in the office.  She was put on Keflex and just finished it 2 days ago.  She states that her daughter was changing the bandage today and noticed some purulent discharge.  She states that the discharge has been happening for the last 2 weeks.  She denies any fevers.  She is scheduled for a angiogram later this week with vascular.   The history is provided by the patient.       Past Medical History:  Diagnosis Date  . Anemia   . Arthritis   . Asthma    in the past  . CHF (congestive heart failure) ([place])   . Chronic kidney disease    Renal failure, dialysis on M/W/F  . 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 End stage renal disease ([place])   . End stage renal disease on dialysis ([place])   . Gangrene ([place])   . Hemodialysis access, arteriovenous graft ([place])   . Hyperglycemia due to diabetes mellitus ([place]) 10/15/2019  . Hyperlipidemia   . Hypertension   . Neuromuscular disorder [facility])     Patient Active Problem List   Diagnosis Date Noted  . Leucocytosis 07/05/2020  . Below-knee amputation of left lower extremity ([place]) 06/16/2020  . S/P BKA (below knee amputation) unilateral ([place]) 06/08/2020  . Abdominal wall cellulitis   . Panniculitis 12/29/2019  . Osteomyelitis ([place]) 10/15/2019  . Osteomyelitis of third toe of  right foot ([place]) 10/14/2019  . Controlled type 2 diabetes mellitus with chronic kidney disease on chronic dialysis, with long-term current use of insulin ([place]) 12/02/2016  . ESRD (end stage renal disease) ([place])   . Asthma, mild persistent 07/25/2015  . Hypertension secondary to other renal disorders 07/25/2015  . Anemia in chronic kidney disease 02/17/2015  . 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 Essential (primary) hypertension 06/24/2014  . Heart failure ([place]) 06/24/2014  . Obesity, Class III, BMI 40-49.9 (morbid obesity) ([place]) 06/24/2014    Past Surgical History:  Procedure Laterality Date  .  LEFT BELOW KNEE AMPUTATION (Left Knee)  06/08/2020  . A/V FISTULAGRAM Right 02/04/2017   Procedure: A/V Fistulagram;  Surgeon: Waynetta Sandy, MD;  Location: [place] CV LAB;  Service: Cardiovascular;  Laterality: Right;  . ABDOMINAL AORTOGRAM W/LOWER EXTREMITY N/A 11/18/2019   Procedure: ABDOMINAL AORTOGRAM W/LOWER EXTREMITY;  Surgeon: [name], MD;  Location: [place] [facility];  Service: Cardiovascular;  Laterality: N/A;  . ABDOMINAL AORTOGRAM W/LOWER EXTREMITY N/A 02/23/2020   Procedure: ABDOMINAL AORTOGRAM W/LOWER EXTREMITY;  Surgeon: [name], MD;  Location: [place] CV [facility];  Service: Cardiovascular;  Laterality: N/A;  Bilateral  . AMPUTATION Left 06/08/2020   Procedure: LEFT BELOW KNEE AMPUTATION;  Surgeon: Cherre Robins, MD;  Location: [facility];  Service: Vascular;  Laterality: Left;  . AV FISTULA PLACEMENT Right 08/25/2014   Procedure: RIGHT BRACHIOCEPHALIC ARTERIOVENOUS (AV) FISTULA CREATION;  Surgeon: Mal Misty, MD;  [facility];  Service: Vascular;  Laterality: Right;  . AV FISTULA PLACEMENT Right 03/18/2019   Procedure: INSERTION OF ARTERIOVENOUS (AV) GORE-TEX GRAFT RIGHT ARM;  Surgeon: Serafina Mitchell, MD;  Location: [facility];  Service: Vascular;  Laterality: Right;  . BASCILIC VEIN TRANSPOSITION Right  12/24/2018   Procedure: FIRST STAGE BASILIC VEIN TRANSPOSITION RIGHT UPPER ARM;  Surgeon: Serafina Mitchell, MD;  Location: [place];  Service: Vascular;  Laterality: Right;  . FISTULA SUPERFICIALIZATION Right 11/03/2014   Procedure: RIGHT UPPER ARM FISTULA SUPERFICIALIZATION;  Surgeon: Mal Misty, MD;  Location: [facility];  Service: Vascular;  Laterality: Right;  . FISTULOGRAM Right 01/16/2016   Procedure: RIGHT UPPER EXTREMITY ARTERIOVENOUS FISTULOGRAM WITH BALLOON ANGIOPLASTY;  Surgeon: Vickie Epley, MD;  Location: [facility];  Service: Vascular;  Laterality: Right;  . INSERTION OF DIALYSIS CATHETER     X4  . IR FLUORO GUIDE CV LINE RIGHT  12/03/2018  . IR REMOVAL TUN CV CATH W/O FL  07/06/2019  . IR THROMBECTOMY AV FISTULA W/THROMBOLYSIS/PTA INC/SHUNT/IMG RIGHT Right 12/03/2018  . IR THROMBECTOMY AV FISTULA W/THROMBOLYSIS/PTA/STENT INC/SHUNT/IMG RT Right 10/11/2019  . IR US GUIDE VASC ACCESS RIGHT  12/03/2018  . IR US GUIDE VASC ACCESS RIGHT  12/03/2018  . IR US GUIDE VASC ACCESS RIGHT  10/14/2019  . LIGATION OF COMPETING BRANCHES OF ARTERIOVENOUS FISTULA Right 11/03/2014   Procedure: LIGATION OF COMPETING BRANCHE x1 OF RIGHT UPPER ARM ARTERIOVENOUS FISTULA;  Surgeon: Mal Misty, MD;  Location: [place];  Service: Vascular;  Laterality: Right;  . PERIPHERAL VASCULAR BALLOON ANGIOPLASTY Right 02/04/2017   Procedure: PERIPHERAL VASCULAR BALLOON ANGIOPLASTY;  Surgeon: Waynetta Sandy, MD;  Location: [place] [facility];  Service: Cardiovascular;  Laterality: Right;  . PERIPHERAL VASCULAR BALLOON ANGIOPLASTY Right 11/18/2019   Procedure: PERIPHERAL VASCULAR BALLOON ANGIOPLASTY;  Surgeon: [name], MD;  Location: [place] [facility];  Service: Cardiovascular;  Laterality: Right;  anterior tibial  . PERIPHERAL VASCULAR BALLOON ANGIOPLASTY  02/23/2020   Procedure: PERIPHERAL VASCULAR BALLOON ANGIOPLASTY;  Surgeon: [name], MD;  Location: [place] CV LAB;  Service: Cardiovascular;;  Lt   AT  . TRANSMETATARSAL AMPUTATION Left 02/24/2020   Procedure: TRANSMETATARSAL AMPUTATION LEFT;  Surgeon: Cherre Robins, MD;  Location: [facility];  Service: Vascular;  Laterality: Left;  . TUBAL LIGATION       OB History   No obstetric history on file.     Family History  Problem Relation Age of Onset  . Diabetes Mother   . Hypertension Mother   . Cancer Father   . Diabetes Father   . Hypertension Sister   . Diabetes Daughter   . Heart disease Daughter   . Hypertension Daughter   . Diabetes Son   . Heart disease Son   . Hypertension Son   . Peripheral vascular disease Son        amputation    Social History   Tobacco Use  . Smoking status: Former Smoker    Types: Cigarettes    Quit date: 07/25/1984    Years since quitting: 36.0  . Smokeless tobacco: Never Used  Vaping Use  . Vaping Use: Never used  Substance Use Topics  . Alcohol use: No    Alcohol/week: 0.0 standard drinks  . Drug use: No  Home Medications Prior to Admission medications   Medication Sig Start Date End Date Taking? Authorizing Provider  acetaminophen (TYLENOL) 325 MG tablet Take 1-2 tablets (325-650 mg total) by mouth every 4 (four) hours as needed for mild pain. 07/04/20   Love, Ivan Anchors, PA-C  amLODipine (NORVASC) 5 MG tablet Take 1 tablet (5 mg total) by mouth at bedtime. 07/04/20   Love, Ivan Anchors, PA-C  aspirin EC 81 MG tablet Take 81 mg by mouth at bedtime. Swallow whole.    [provider]  atorvastatin (LIPITOR) 40 MG tablet Take 1 tablet (40 mg total) by mouth daily. Patient taking differently: Take 40 mg by mouth at bedtime. 08/17/19   Claretta Fraise, MD  clopidogrel (PLAVIX) 75 MG tablet Take 1 tablet (75 mg total) by mouth daily. 11/18/19 11/17/20  [name], MD  diclofenac Sodium (VOLTAREN) 1 % GEL Apply 2 g topically 4 (four) times daily. 07/04/20   Love, Ivan Anchors, PA-C  diphenhydrAMINE (BENADRYL) 25 MG tablet Take 25 mg by mouth every 6 (six) hours as needed for itching.     [provider]  Dulaglutide (TRULICITY) 1.5 0000000 SOPN Inject 1.5 mg into the skin once a week. Saturday 04/09/20   Claretta Fraise, MD  gabapentin (NEURONTIN) 300 MG capsule Take 1 capsule (300 mg total) by mouth at bedtime. 07/27/20   Bayard Hugger, NP  HYDROcodone-acetaminophen (NORCO/VICODIN) 5-325 MG tablet Take 1 tablet by mouth every 12 (twelve) hours as needed for severe pain. 07/04/20   Love, Ivan Anchors, PA-C  insulin glargine, 1 Unit Dial, (TOUJEO) 300 UNIT/ML Solostar Pen Inject 16 Units into the skin daily. 07/04/20   Love, Ivan Anchors, PA-C  metoprolol succinate (TOPROL-XL) 50 MG 24 hr tablet Take 1 tablet (50 mg total) by mouth daily. Take with or immediately following a meal. 07/04/20   Love, Ivan Anchors, PA-C  multivitamin (RENA-VIT) TABS tablet Take 1 tablet by mouth at bedtime.     [provider]  Nutritional Supplements (,FEEDING SUPPLEMENT, PROSOURCE PLUS) liquid Take 30 mLs by mouth 2 (two) times daily between meals. 07/04/20   Love, Ivan Anchors, PA-C  polyethylene glycol (MIRALAX / GLYCOLAX) 17 g packet Take 17 g by mouth daily. 06/17/20   Regalado, Belkys A, MD  senna-docusate (SENOKOT-S) 8.6-50 MG tablet Take 1 tablet by mouth 2 (two) times daily. 07/04/20   Love, Ivan Anchors, PA-C  sevelamer carbonate (RENVELA) 800 MG tablet Take 1,600-2,400 mg by mouth See admin instructions. Take 2400 mg with each meal and 1600 mg with each snack    [provider]  traMADol (ULTRAM) 50 MG tablet Take 1 tablet (50 mg total) by mouth every 12 (twelve) hours as needed for moderate pain. 07/27/20   Bayard Hugger, NP    Allergies    Sulfa antibiotics  Review of Systems   Review of Systems  Musculoskeletal:       R foot pain   All other systems reviewed and are negative.   Physical Exam Updated Vital Signs BP (!) 175/63 (BP Location: Right Arm)   Pulse 86   Temp 99 F (37.2 C) (Oral)   Resp 18   Ht '5\' 3"'$  (1.6 m)   Wt 97.5 kg   LMP  (LMP Unknown)   SpO2 99%   BMI  38.09 kg/m   Physical Exam Vitals and nursing note reviewed.  Constitutional:      Comments: Chronically ill  HENT:     Head: Normocephalic.  Nose: Nose normal.     Mouth/Throat:     Mouth: Mucous membranes are moist.  Eyes:     Extraocular Movements: Extraocular movements intact.     Pupils: Pupils are equal, round, and reactive to light.  Cardiovascular:     Rate and Rhythm: Normal rate and regular rhythm.     Pulses: Normal pulses.     Heart sounds: Normal heart sounds.  Pulmonary:     Effort: Pulmonary effort is normal.     Breath sounds: Normal breath sounds.  Abdominal:     General: Abdomen is flat.     Palpations: Abdomen is soft.  Musculoskeletal:     Cervical back: Normal range of motion and neck supple.     Comments: Right foot with wet gangrene of the right third MP TP joint.  The right third toe is missing.  Has surrounding cellulitis as well.  Patient has diminished right DP pulse.  Skin:    Capillary Refill: Capillary refill takes less than 2 seconds.     Findings: Erythema present.  Neurological:     General: No focal deficit present.     Mental Status: She is oriented to person, place, and time.  Psychiatric:        Mood and Affect: Mood normal.        Behavior: Behavior normal.       ED Results / Procedures / Treatments   Labs (all labs ordered are listed, but only abnormal results are displayed) Labs Reviewed  COMPREHENSIVE METABOLIC PANEL - Abnormal; Notable for the following components:      Result Value   Chloride 96 (*)    Glucose, Bld 166 (*)    Creatinine, Ser 7.67 (*)    Albumin 2.6 (*)    AST 10 (*)    GFR, Estimated 5 (*)    All other components within normal limits  CBC WITH DIFFERENTIAL/PLATELET - Abnormal; Notable for the following components:   WBC 18.3 (*)    RBC 3.70 (*)    Hemoglobin 9.2 (*)    HCT 31.3 (*)    MCH 24.9 (*)    MCHC 29.4 (*)    RDW 18.2 (*)    Platelets 894 (*)    Neutro Abs 14.3 (*)    Monocytes  Absolute 1.1 (*)    Eosinophils Absolute 0.7 (*)    Abs Immature Granulocytes 0.15 (*)    All other components within normal limits  CULTURE, BLOOD (ROUTINE X 2)  CULTURE, BLOOD (ROUTINE X 2)  LACTIC ACID, PLASMA  MAGNESIUM  LACTIC ACID, PLASMA    EKG EKG Interpretation  Date/Time:  Tuesday August 08 2020 21:55:02 EDT Ventricular Rate:  81 PR Interval:  151 QRS Duration: 94 QT Interval:  368 QTC Calculation: 428 R Axis:   95 Text Interpretation: Sinus rhythm Right axis deviation No significant change since last tracing Confirmed by Wandra Arthurs [phone]) on 08/08/2020 9:56:57 PM   Radiology DG Foot 2 Views Right  Result Date: 08/08/2020 CLINICAL DATA:  Redness and drainage right foot EXAM: RIGHT FOOT - 2 VIEW COMPARISON:  02/22/2020 FINDINGS: Bones appear osteopenic. Old fracture deformity base of fifth metatarsal. Apparent partial amputation of third digit with residual proximal phalanx. Slight erosive change at the cut end of the proximal phalanx. Suspected bony resorption of the second distal phalanx. Possible erosions at the base of the second and third proximal phalanges and head of third metatarsal. Vascular calcifications. 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 IMPRESSION: 1. Apparent interval partial amputation third digit with remnant proximal phalanx. Findings suspicious for osteomyelitis involving the distal phalanx of the second digit and the cut end of the residual third proximal phalanx. There may be additional erosive change involving the bases of the second and third proximal phalanges and head of third metatarsal. 2. 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 Electronically Signed   By: Donavan Foil M.D.   On: 08/08/2020 21:01    Procedures Procedures   CRITICAL CARE Performed by: Wandra Arthurs   Total critical  care time: 30 minutes  Critical care time was exclusive of separately billable procedures and treating other patients.  Critical care was necessary to treat or prevent imminent or life-threatening deterioration.  Critical care was time spent personally by me on the following activities: development of treatment plan with patient and/or surrogate as well as nursing, discussions with consultants, evaluation of patient's response to treatment, examination of patient, obtaining history from patient or surrogate, ordering and performing treatments and interventions, ordering and review of laboratory studies, ordering and review of radiographic studies, pulse oximetry and re-evaluation of patient's condition.   Medications Ordered in ED Medications  vancomycin (VANCOREADY) IVPB 2000 mg/400 mL (has no administration in time range)  ceFEPIme (MAXIPIME) 2 g in sodium chloride 0.9 % 100 mL IVPB (has no administration in time range)    ED Course  I have reviewed the triage vital signs and the nursing notes.  Pertinent labs & imaging results that were available during my care of the patient were reviewed by me and considered in my medical decision making (see chart for details).    MDM Rules/Calculators/A&P                         AILI FIEBELKORN is a 65 y.o. female here with right foot swelling and purulent discharge.  Patient had a debridement in the office 2 weeks ago.  Concern for possible osteomyelitis versus cellulitis.  Will get foot x-ray and get CBC and BMP  10:12 PM Patient has a creatinine of 7 which is baseline.  Her potassium is normal at 4.7. She has osteomyelitis on the right second toe.  I ordered broad-spectrum antibiotics.  I consulted Dr. Scot Dock from vascular surgery.  He will see patient as consult.  Will admit to medicine service for IV antibiotics.  I left a secure chat with Dr. Carolin Sicks from nephrology to dialyze patient tomorrow.  Will admit for sepsis from osteomyelitis     Final Clinical Impression(s) / ED Diagnoses Final diagnoses:  Right foot pain    Rx / DC Orders ED Discharge Orders    None       Drenda Freeze, MD 08/08/20 2317

## 2020-08-08 NOTE — ED Triage Notes (Signed)
Pt c/o redness and drainage on right foot, started yesterday. Pt denies fevers.

## 2020-08-08 NOTE — ED Triage Notes (Signed)
Emergency Medicine Provider Triage Evaluation Note  Christy Zhang , a 65 y.o. female  was evaluated in triage.  Pt complains of infection of her left foot, states she has been diagnosed with gangrene in the left foot in the past currently on antibiotics but does not remember which she is taking.  States that her foot is become more blue and she is noticing discharge from her fourth digit of her left foot.  Denies paresthesia or weakness in the foot, denies chest pain or shortness of breath.  Denies fevers or chills.  Review of Systems  Positive: Skin change of the left foot, purulent discharge from his fourth left toe. Negative: Denies fevers, chills, paresthesias in the lower extremities.  Physical Exam  BP (!) 159/63 (BP Location: Left Arm)   Pulse 82   Temp 99 F (37.2 C) (Oral)   Resp 18   LMP  (LMP Unknown)   SpO2 100%  Gen:   Awake, no distress   HEENT:  Atraumatic  Resp:  Normal effort  Cardiac:  Normal rate  MSK:   Moves extremities without difficulty, difficult to palpate pulse in the left foot, warm to the touch, current discharge from the fourth left digit of the lower foot Neuro:  Speech clear   Medical Decision Making  Medically screening exam initiated at 6:40 PM.  Appropriate orders placed.  NOLITA FACEY was informed that the remainder of the evaluation will be completed by another provider, this initial triage assessment does not replace that evaluation, and the importance of remaining in the ED until their evaluation is complete.  Clinical Impression  Concern for infection of the left foot, will order sepsis work-up patient will need further evaluation.   Marcello Fennel, PA-C 08/08/20 1842

## 2020-08-08 NOTE — Telephone Encounter (Signed)
Received VM from Arbie Cookey at [facility] that she sent patient to [facility] ED due to toe turning black and increased drainage. Attempted to call back, UTR.

## 2020-08-08 NOTE — Progress Notes (Signed)
Pharmacy Antibiotic Note  Christy Zhang is a 65 y.o. female admitted on 08/08/2020 with wound infection.  Pharmacy has been consulted for cefepime dosing.  Presenting with infection of L foot - more blue and noticing purulent discharge from 4th digit. Was recently prescribed keflex for 10 days on 07/25/2020. Has hx of ESRD - per notes appears MWF schedule. WBC 18.3, LA 1.4, afebrile.  Plan: Vancomycin 2g IV then 1g IV qHD session Cefepime 2g IV once then will order 2g IV qHD Monitor cx results, clinical pic, HD schedule, and vanc levels as appropriate   Height: '5\' 3"'$  (160 cm) Weight: 97.5 kg (215 lb) IBW/kg (Calculated) : 52.4  Temp (24hrs), Avg:99 F (37.2 C), Min:99 F (37.2 C), Max:99 F (37.2 C)  Recent Labs  Lab 08/08/20 1855  WBC 18.3*  CREATININE 7.67*  LATICACIDVEN 1.4    Estimated Creatinine Clearance: 8.3 mL/min (A) (by C-G formula based on SCr of 7.67 mg/dL (H)).    Allergies  Allergen Reactions  . Sulfa Antibiotics Swelling    Antimicrobials this admission: Vancomycin 4/12 >>  Cefepime 4/12 >>   Dose adjustments this admission: N/A  Microbiology results: 4/12 BCx: sent  Thank you for allowing pharmacy to be a part of this patient's care.  Antonietta Jewel, PharmD, [place] Clinical Pharmacist  Phone: [phone] 08/08/2020 10:16 PM  Please check AMION for all [place] phone numbers After 10:00 PM, call [place] [phone]

## 2020-08-08 NOTE — Consult Note (Signed)
REASON FOR CONSULT:    Cellulitis and drainage from the right foot.  The consult is requested by Dr. Darl Householder.  ASSESSMENT & PLAN:   GANGRENOUS RIGHT FOREFOOT: This patient has an extensive wound in the right forefoot and at the very least would require transmetatarsal amputation.  Given her peripheral vascular disease, end-stage renal disease, and diabetes, she is at very high risk for requiring a amputation more proximally on the right.  She is being admitted for intravenous antibiotics.  I think it would be reasonable to proceed with her arteriogram which is already been scheduled for Thursday with Dr. Carlis Abbott.  Based on her previous arteriogram her only patent tibial vessel was the anterior tibial artery which had moderate disease and was occluded at the ankle.    Patient is scheduled for an arteriogram on 08/10/2020 with Dr. Carlis Abbott.  Deitra Mayo, MD Office: [phone]   HPI:   Christy Zhang is a pleasant 65 y.o. female, who was seen in consultation by Dr. Ruta Hinds on 08/12/2019 initially.  She was seen with an ulceration of her right second toe.  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  She has subsequently been seen back in our office multiple times by the physicians assistants.  Most recently she was seen on 07/25/2020.  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  She had developed a wound on the right third toe again and was doing daily dressing changes.  The end of the toe actually auto amputated.  She also had a wound on the plantar aspect of the foot and for this reason, given the extent of the wound set up for arteriography which was scheduled for 08/10/2020.  She was also started on Keflex 500 mg p.o. twice daily.  She finished her Keflex on Sunday (2 days ago) but then she was scheduled for an arteriogram on Thursday she thought she would wait until then for getting more antibiotics.   She denies significant pain in the foot but  has had increased drainage from the right foot so presented to the emergency department.  She denies any history of rest pain.  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  She denies fever or chills.  Patient has end-stage renal disease and is on dialysis. (MWF)  Patient did have a previous arteriogram on 02/23/2020 and had an intervention on the left.  On the right side she had a patent common femoral, deep femoral, superficial femoral, and popliteal arteries with inline flow through the anterior tibial artery although it had diffuse moderate disease.  It occluded at the ankle.  Past Medical History:  Diagnosis Date  . Anemia   . Arthritis   . Asthma    in the past  . CHF (congestive heart failure) ([place])   . Chronic kidney disease    Renal failure, dialysis on M/W/F  . 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 End stage renal disease ([place])   . End stage renal disease on dialysis ([place])   . Gangrene ([place])   . Hemodialysis access, arteriovenous graft ([place])   . Hyperglycemia due to diabetes mellitus ([place]) 10/15/2019  . Hyperlipidemia   . Hypertension   . Neuromuscular disorder ([place])     Family History  Problem Relation Age of Onset  . Diabetes Mother   . Hypertension Mother   . Cancer Father   . Diabetes Father   .  Hypertension Sister   . Diabetes Daughter   . Heart disease Daughter   . Hypertension Daughter   . Diabetes Son   . Heart disease Son   . Hypertension Son   . Peripheral vascular disease Son        amputation    SOCIAL HISTORY: Social History   Socioeconomic History  . Marital status: Widowed    Spouse name: Not on file  . Number of children: Not on file  . Years of education: Not on file  . Highest education level: Not on file  Occupational History  . Not on file  Tobacco Use  . Smoking status: Former Smoker    Types: Cigarettes    Quit date: 07/25/1984    Years since quitting: 36.0  . Smokeless  tobacco: Never Used  Vaping Use  . Vaping Use: Never used  Substance and Sexual Activity  . Alcohol use: No    Alcohol/week: 0.0 standard drinks  . Drug use: No  . Sexual activity: Not on file  Other Topics Concern  . Not on file  Social History Narrative  . Not on file   Social Determinants of Health   Financial Resource Strain: Low Risk   . Difficulty of Paying Living Expenses: Not very hard  Food Insecurity: No Food Insecurity  . Worried About Charity fundraiser in the Last Year: Never true  . Ran Out of Food in the Last Year: Never true  Transportation Needs: No Transportation Needs  . Lack of Transportation (Medical): No  . Lack of Transportation (Non-Medical): No  Physical Activity: Inactive  . Days of Exercise per Week: 0 days  . Minutes of Exercise per Session: 0 min  Stress: No Stress Concern Present  . Feeling of Stress : Only a little  Social Connections: Socially Isolated  . Frequency of Communication with Friends and Family: More than three times a week  . Frequency of Social Gatherings with Friends and Family: More than three times a week  . Attends Religious Services: Never  . Active Member of Clubs or Organizations: No  . Attends Archivist Meetings: Never  . Marital Status: Widowed  Intimate Partner Violence: Not on file    Allergies  Allergen Reactions  . Sulfa Antibiotics Swelling    Current Facility-Administered Medications  Medication Dose Route Frequency Provider Last Rate Last Admin  . ceFEPIme (MAXIPIME) 2 g in sodium chloride 0.9 % 100 mL IVPB  2 g Intravenous Once Drenda Freeze, MD      . vancomycin Alcus Dad) IVPB 2000 mg/400 mL  2,000 mg Intravenous Once Drenda Freeze, MD       Current Outpatient Medications  Medication Sig Dispense Refill  . acetaminophen (TYLENOL) 325 MG tablet Take 1-2 tablets (325-650 mg total) by mouth every 4 (four) hours as needed for mild pain.    Marland Kitchen amLODipine (NORVASC) 5 MG tablet Take 1  tablet (5 mg total) by mouth at bedtime. 30 tablet 0  . aspirin EC 81 MG tablet Take 81 mg by mouth at bedtime. Swallow whole.    Marland Kitchen atorvastatin (LIPITOR) 40 MG tablet Take 1 tablet (40 mg total) by mouth daily. (Patient taking differently: Take 40 mg by mouth at bedtime.) 90 tablet 3  . clopidogrel (PLAVIX) 75 MG tablet Take 1 tablet (75 mg total) by mouth daily. 30 tablet 11  . diclofenac Sodium (VOLTAREN) 1 % GEL Apply 2 g topically 4 (four) times daily. 350 g 0  . diphenhydrAMINE (  BENADRYL) 25 MG tablet Take 25 mg by mouth every 6 (six) hours as needed for itching.    . Dulaglutide (TRULICITY) 1.5 0000000 SOPN Inject 1.5 mg into the skin once a week. Saturday    . gabapentin (NEURONTIN) 300 MG capsule Take 1 capsule (300 mg total) by mouth at bedtime. 30 capsule 1  . HYDROcodone-acetaminophen (NORCO/VICODIN) 5-325 MG tablet Take 1 tablet by mouth every 12 (twelve) hours as needed for severe pain. 14 tablet 0  . insulin glargine, 1 Unit Dial, (TOUJEO) 300 UNIT/ML Solostar Pen Inject 16 Units into the skin daily.    . metoprolol succinate (TOPROL-XL) 50 MG 24 hr tablet Take 1 tablet (50 mg total) by mouth daily. Take with or immediately following a meal. 30 tablet 0  . multivitamin (RENA-VIT) TABS tablet Take 1 tablet by mouth at bedtime.     . Nutritional Supplements (,FEEDING SUPPLEMENT, PROSOURCE PLUS) liquid Take 30 mLs by mouth 2 (two) times daily between meals. 887 mL 0  . polyethylene glycol (MIRALAX / GLYCOLAX) 17 g packet Take 17 g by mouth daily. 14 each 0  . senna-docusate (SENOKOT-S) 8.6-50 MG tablet Take 1 tablet by mouth 2 (two) times daily. 60 tablet 0  . sevelamer carbonate (RENVELA) 800 MG tablet Take 1,600-2,400 mg by mouth See admin instructions. Take 2400 mg with each meal and 1600 mg with each snack    . traMADol (ULTRAM) 50 MG tablet Take 1 tablet (50 mg total) by mouth every 12 (twelve) hours as needed for moderate pain. 30 tablet 1    REVIEW OF SYSTEMS:  '[X]'$  denotes  positive finding, '[ ]'$  denotes negative finding Cardiac  Comments:  Chest pain or chest pressure:    Shortness of breath upon exertion:    Short of breath when lying flat:    Irregular heart rhythm:        Vascular    Pain in calf, thigh, or hip brought on by ambulation:    Pain in feet at night that wakes you up from your sleep:     Blood clot in your veins:    Leg swelling:         Pulmonary    Oxygen at home:    Productive cough:     Wheezing:         Neurologic    Sudden weakness in arms or legs:     Sudden numbness in arms or legs:     Sudden onset of difficulty speaking or slurred speech:    Temporary loss of vision in one eye:     Problems with dizziness:         Gastrointestinal    Blood in stool:     Vomited blood:         Genitourinary    Burning when urinating:     Blood in urine:        Psychiatric    Major depression:         Hematologic    Bleeding problems:    Problems with blood clotting too easily:        Skin    Rashes or ulcers: x       Constitutional    Fever or chills:     PHYSICAL EXAM:   Vitals:   08/08/20 1729 08/08/20 1842 08/08/20 2145  BP: (!) 159/63  (!) 175/63  Pulse: 82  86  Resp: 18  18  Temp: 99 F (37.2 C)    TempSrc: Oral  SpO2: 100%  99%  Weight:  97.5 kg   Height:  '5\' 3"'$  (1.6 m)    Body mass index is 38.09 kg/m.  GENERAL: The patient is a well-nourished female, in no acute distress. The vital signs are documented above. CARDIAC: There is a regular rate and rhythm.  VASCULAR: I do not detect carotid bruits. She has palpable femoral pulses on the right femoral pulses slightly diminished.  It is somewhat difficult to assess her pulses however because of her obesity. PULMONARY: There is good air exchange bilaterally without wheezing or rales. ABDOMEN: Soft and non-tender with normal pitched bowel sounds.  MUSCULOSKELETAL: Her left below the knee amputation has healed although there is some eschar over the incision  that is dry. NEUROLOGIC: No focal weakness or paresthesias are detected. SKIN: There is significant drainage at the site of the third toe and between the first and second toes.  The wound extends onto the plantar aspect of the foot as documented below.      PSYCHIATRIC: The patient has a normal affect.  DATA:    X-RAY RIGHT FOOT: The x-ray of the right foot shows evidence of osteoinvolving the distal phalanx of the second toe and the end of the residual third proximal phalanx.  There also noted to be additional erosive changes involving the base of the second and third phalanges in the head of the third metatarsal.  LABS: Her creatinine is 7.67.  GFR is 5. White blood cell count 18.3.  Hemoglobin 9.2.  Platelets 894,000.

## 2020-08-09 ENCOUNTER — Other Ambulatory Visit: Payer: Self-pay

## 2020-08-09 ENCOUNTER — Encounter (HOSPITAL_COMMUNITY): Payer: Self-pay | Admitting: Family Medicine

## 2020-08-09 DIAGNOSIS — D75839 Thrombocytosis, unspecified: Secondary | ICD-10-CM | POA: Diagnosis present

## 2020-08-09 DIAGNOSIS — M86471 Chronic osteomyelitis with draining sinus, right ankle and foot: Secondary | ICD-10-CM | POA: Diagnosis not present

## 2020-08-09 LAB — CBC
HCT: 29.1 % — ABNORMAL LOW (ref 36.0–46.0)
Hemoglobin: 8.6 g/dL — ABNORMAL LOW (ref 12.0–15.0)
MCH: 24.5 pg — ABNORMAL LOW (ref 26.0–34.0)
MCHC: 29.6 g/dL — ABNORMAL LOW (ref 30.0–36.0)
MCV: 82.9 fL (ref 80.0–100.0)
Platelets: 782 10*3/uL — ABNORMAL HIGH (ref 150–400)
RBC: 3.51 MIL/uL — ABNORMAL LOW (ref 3.87–5.11)
RDW: 18.3 % — ABNORMAL HIGH (ref 11.5–15.5)
WBC: 16.9 10*3/uL — ABNORMAL HIGH (ref 4.0–10.5)
nRBC: 0 % (ref 0.0–0.2)

## 2020-08-09 LAB — HEMOGLOBIN A1C
Hgb A1c MFr Bld: 8.8 % — ABNORMAL HIGH (ref 4.8–5.6)
Hgb A1c MFr Bld: 6.5 % — ABNORMAL HIGH (ref 4.8–5.6)
Mean Plasma Glucose: 205.86 mg/dL
Mean Plasma Glucose: 139.85 mg/dL

## 2020-08-09 LAB — CBG MONITORING, ED: Glucose-Capillary: 82 mg/dL (ref 70–99)

## 2020-08-09 LAB — BASIC METABOLIC PANEL
Anion gap: 12 (ref 5–15)
BUN: 22 mg/dL (ref 8–23)
CO2: 25 mmol/L (ref 22–32)
Calcium: 8.7 mg/dL — ABNORMAL LOW (ref 8.9–10.3)
Chloride: 98 mmol/L (ref 98–111)
Creatinine, Ser: 8.25 mg/dL — ABNORMAL HIGH (ref 0.44–1.00)
GFR, Estimated: 5 mL/min — ABNORMAL LOW (ref >60)
Glucose, Bld: 93 mg/dL (ref 70–99)
Potassium: 4.3 mmol/L (ref 3.5–5.1)
Sodium: 135 mmol/L (ref 135–145)

## 2020-08-09 LAB — GLUCOSE, CAPILLARY
Glucose-Capillary: 118 mg/dL — ABNORMAL HIGH (ref 70–99)
Glucose-Capillary: 114 mg/dL — ABNORMAL HIGH (ref 70–99)
Glucose-Capillary: 115 mg/dL — ABNORMAL HIGH (ref 70–99)

## 2020-08-09 LAB — SARS CORONAVIRUS 2 (TAT 6-24 HRS): SARS Coronavirus 2: NEGATIVE

## 2020-08-09 LAB — PREALBUMIN: Prealbumin: 11.6 mg/dL — ABNORMAL LOW (ref 18–38)

## 2020-08-09 LAB — C-REACTIVE PROTEIN: CRP: 19 mg/dL — ABNORMAL HIGH (ref <1.0)

## 2020-08-09 LAB — SEDIMENTATION RATE: Sed Rate: 83 mm/hr — ABNORMAL HIGH (ref 0–22)

## 2020-08-09 MED ORDER — SENNOSIDES-DOCUSATE SODIUM 8.6-50 MG PO TABS
1.0000 | ORAL_TABLET | Freq: Two times a day (BID) | ORAL | Status: DC
Start: 2020-08-09 — End: 2020-08-18
  Administered 2020-08-09 – 2020-08-18 (×18): 1 via ORAL
  Filled 2020-08-09 (×18): qty 1

## 2020-08-09 MED ORDER — ATORVASTATIN CALCIUM 40 MG PO TABS
40.0000 mg | ORAL_TABLET | Freq: Every day | ORAL | Status: DC
  Administered 2020-08-09 – 2020-08-18 (×10): 40 mg via ORAL
  Filled 2020-08-09 (×10): qty 1

## 2020-08-09 MED ORDER — HYDROCODONE-ACETAMINOPHEN 5-325 MG PO TABS: 1.0000 | ORAL_TABLET | Freq: Two times a day (BID) | ORAL | Status: DC | PRN

## 2020-08-09 MED ORDER — ACETAMINOPHEN 325 MG PO TABS
650.0000 mg | ORAL_TABLET | Freq: Four times a day (QID) | ORAL | Status: DC | PRN
  Administered 2020-08-12 – 2020-08-18 (×5): 650 mg via ORAL
  Filled 2020-08-09 (×4): qty 2

## 2020-08-09 MED ORDER — ONDANSETRON HCL 4 MG/2ML IJ SOLN: 4.0000 mg | Freq: Four times a day (QID) | INTRAMUSCULAR | Status: DC | PRN

## 2020-08-09 MED ORDER — DOXERCALCIFEROL 4 MCG/2ML IV SOLN
INTRAVENOUS | Status: AC
  Administered 2020-08-09: 1.5 ug via INTRAVENOUS
  Filled 2020-08-09: qty 2

## 2020-08-09 MED ORDER — DIPHENHYDRAMINE HCL 25 MG PO CAPS: 25.0000 mg | ORAL_CAPSULE | Freq: Four times a day (QID) | ORAL | Status: DC | PRN

## 2020-08-09 MED ORDER — METRONIDAZOLE 500 MG PO TABS
500.0000 mg | ORAL_TABLET | Freq: Three times a day (TID) | ORAL | Status: AC
  Administered 2020-08-09 – 2020-08-14 (×17): 500 mg via ORAL
  Filled 2020-08-09 (×17): qty 1

## 2020-08-09 MED ORDER — ASPIRIN EC 81 MG PO TBEC
81.0000 mg | DELAYED_RELEASE_TABLET | Freq: Every day | ORAL | Status: DC
  Administered 2020-08-09 – 2020-08-17 (×10): 81 mg via ORAL
  Filled 2020-08-09 (×10): qty 1

## 2020-08-09 MED ORDER — OXYCODONE HCL 5 MG PO TABS: 5.0000 mg | ORAL_TABLET | Freq: Two times a day (BID) | ORAL | Status: DC | PRN

## 2020-08-09 MED ORDER — CLOPIDOGREL BISULFATE 75 MG PO TABS
75.0000 mg | ORAL_TABLET | Freq: Every evening | ORAL | Status: DC
  Administered 2020-08-09 – 2020-08-17 (×9): 75 mg via ORAL
  Filled 2020-08-09 (×9): qty 1

## 2020-08-09 MED ORDER — INSULIN GLARGINE 100 UNIT/ML ~~LOC~~ SOLN
10.0000 [IU] | Freq: Every day | SUBCUTANEOUS | Status: DC
  Administered 2020-08-09 – 2020-08-18 (×10): 10 [IU] via SUBCUTANEOUS
  Filled 2020-08-09 (×12): qty 0.1

## 2020-08-09 MED ORDER — CHLORHEXIDINE GLUCONATE CLOTH 2 % EX PADS
6.0000 | MEDICATED_PAD | Freq: Every day | CUTANEOUS | Status: DC
  Administered 2020-08-10 – 2020-08-14 (×5): 6 via TOPICAL

## 2020-08-09 MED ORDER — POLYETHYLENE GLYCOL 3350 17 G PO PACK
17.0000 g | PACK | Freq: Every day | ORAL | Status: DC
  Administered 2020-08-09 – 2020-08-18 (×7): 17 g via ORAL
  Filled 2020-08-09 (×7): qty 1

## 2020-08-09 MED ORDER — SODIUM CHLORIDE 0.9 % IV SOLN
2.0000 g | INTRAVENOUS | Status: AC
  Administered 2020-08-09 – 2020-08-14 (×6): 2 g via INTRAVENOUS
  Filled 2020-08-09: qty 20
  Filled 2020-08-09 (×5): qty 2

## 2020-08-09 MED ORDER — INSULIN ASPART 100 UNIT/ML ~~LOC~~ SOLN
0.0000 [IU] | Freq: Three times a day (TID) | SUBCUTANEOUS | Status: DC
  Administered 2020-08-11: 3 [IU] via SUBCUTANEOUS
  Administered 2020-08-11 – 2020-08-12 (×2): 2 [IU] via SUBCUTANEOUS
  Administered 2020-08-12 – 2020-08-17 (×3): 1 [IU] via SUBCUTANEOUS
  Administered 2020-08-17: 2 [IU] via SUBCUTANEOUS

## 2020-08-09 MED ORDER — RENA-VITE PO TABS
1.0000 | ORAL_TABLET | Freq: Every day | ORAL | Status: DC
  Administered 2020-08-09 – 2020-08-17 (×9): 1 via ORAL
  Filled 2020-08-09 (×9): qty 1

## 2020-08-09 MED ORDER — METOPROLOL SUCCINATE ER 50 MG PO TB24
50.0000 mg | ORAL_TABLET | Freq: Every day | ORAL | Status: DC
  Administered 2020-08-09 – 2020-08-18 (×8): 50 mg via ORAL
  Filled 2020-08-09 (×9): qty 1

## 2020-08-09 MED ORDER — PROSOURCE PLUS PO LIQD
30.0000 mL | Freq: Two times a day (BID) | ORAL | Status: DC
  Administered 2020-08-09 – 2020-08-18 (×12): 30 mL via ORAL
  Filled 2020-08-09 (×15): qty 30

## 2020-08-09 MED ORDER — AMLODIPINE BESYLATE 5 MG PO TABS
5.0000 mg | ORAL_TABLET | Freq: Every day | ORAL | Status: DC
  Administered 2020-08-09 – 2020-08-17 (×10): 5 mg via ORAL
  Filled 2020-08-09 (×10): qty 1

## 2020-08-09 MED ORDER — ONDANSETRON HCL 4 MG PO TABS: 4.0000 mg | ORAL_TABLET | Freq: Four times a day (QID) | ORAL | Status: DC | PRN

## 2020-08-09 MED ORDER — SEVELAMER CARBONATE 800 MG PO TABS
2400.0000 mg | ORAL_TABLET | Freq: Three times a day (TID) | ORAL | Status: DC
  Administered 2020-08-09 – 2020-08-18 (×18): 2400 mg via ORAL
  Filled 2020-08-09 (×24): qty 3

## 2020-08-09 MED ORDER — VANCOMYCIN HCL IN DEXTROSE 1-5 GM/200ML-% IV SOLN
INTRAVENOUS | Status: AC
  Administered 2020-08-09: 1000 mg via INTRAVENOUS
  Filled 2020-08-09: qty 200

## 2020-08-09 MED ORDER — GABAPENTIN 300 MG PO CAPS
300.0000 mg | ORAL_CAPSULE | Freq: Every day | ORAL | Status: DC
  Administered 2020-08-09 – 2020-08-17 (×10): 300 mg via ORAL
  Filled 2020-08-09 (×10): qty 1

## 2020-08-09 MED ORDER — VANCOMYCIN HCL IN DEXTROSE 1-5 GM/200ML-% IV SOLN
1000.0000 mg | INTRAVENOUS | Status: DC
  Filled 2020-08-09: qty 200

## 2020-08-09 MED ORDER — ACETAMINOPHEN 650 MG RE SUPP: 650.0000 mg | Freq: Four times a day (QID) | RECTAL | Status: DC | PRN

## 2020-08-09 MED ORDER — TRAMADOL HCL 50 MG PO TABS: 50.0000 mg | ORAL_TABLET | Freq: Two times a day (BID) | ORAL | Status: DC | PRN

## 2020-08-09 MED ORDER — DOXERCALCIFEROL 4 MCG/2ML IV SOLN
1.5000 ug | INTRAVENOUS | Status: DC
  Administered 2020-08-16 – 2020-08-18 (×2): 1.5 ug via INTRAVENOUS
  Filled 2020-08-09 (×3): qty 2

## 2020-08-09 MED ORDER — SEVELAMER CARBONATE 800 MG PO TABS
1600.0000 mg | ORAL_TABLET | ORAL | Status: DC | PRN
  Filled 2020-08-09: qty 2

## 2020-08-09 MED ORDER — HEPARIN SODIUM (PORCINE) 5000 UNIT/ML IJ SOLN
5000.0000 [IU] | Freq: Three times a day (TID) | INTRAMUSCULAR | Status: DC
  Administered 2020-08-09 – 2020-08-18 (×26): 5000 [IU] via SUBCUTANEOUS
  Filled 2020-08-09 (×26): qty 1

## 2020-08-09 MED ORDER — NEPRO/CARBSTEADY PO LIQD
237.0000 mL | ORAL | Status: DC
  Administered 2020-08-09 – 2020-08-17 (×8): 237 mL via ORAL

## 2020-08-09 NOTE — Progress Notes (Signed)
Renal Navigator called patient's outpatient HD unit/[facility] to request outpatient orders and inform that patient has been admitted. Navigator faxed H&P to clinic to provide continuity of care.   Alphonzo Cruise, [place] Renal Navigator [phone]

## 2020-08-09 NOTE — Progress Notes (Signed)
Initial Nutrition Assessment  DOCUMENTATION CODES:   Not applicable  INTERVENTION:    ProSource Plus 30 ml BID, each supplement provides 100 kcals and 15 grams protein.   Nepro Shake po once daily, each supplement provides 425 kcal and 19 grams protein  Renal MVI  NUTRITION DIAGNOSIS:   Increased nutrient needs related to wound healing as evidenced by estimated needs.  GOAL:   Patient will meet greater than or equal to 90% of their needs  MONITOR:   PO intake,Supplement acceptance,Weight trends,Labs,I & O's  REASON FOR ASSESSMENT:   Consult Wound healing  ASSESSMENT:   Patient with PMH significant for ESRD on HD, CHF, HLD, DM, HTN, PAD, s/p L BKA, and R foot infection. Presents this admission with R foot gangrene.  Patient denies loss in appetite PTA. On non HD days she consumes three meal daily that consist of B- fried eggs with toast L- chicken with potatoes D- roast with vegetable and grain. On HD days she skips breakfast and has the same lunch and dinner. They provide her with protein bar at HD center but only eats 1/2 each time. Patient takes phosphorus binders as prescribed but forgets every once in awhile. Makes a small amount urine twice daily. Appetite this admission is stable. RD observed patient consuming lunch tray without complication. Patient taking Prosource BID and is willing to try Nepro.   Patients denies weight loss and is unsure of her EDW of 97 kg. Records indicate patient weighed 106.6 kg on 07/26/19 and 98.8 kg this admission. Unable to differentiate dry weight loss from fluid fluctuation given history of ESRD/HD.   Medications: hectorol, SS novolog, lantus, miralax, senokot, renvela Labs: reviewed (awaiting phosphorus)  NUTRITION - FOCUSED PHYSICAL EXAM:  Flowsheet Row Most Recent Value  Orbital Region No depletion  Upper Arm Region Mild depletion  Thoracic and Lumbar Region Unable to assess  Buccal Region No depletion  Temple Region No  depletion  Clavicle Bone Region No depletion  Clavicle and Acromion Bone Region No depletion  Scapular Bone Region Unable to assess  Dorsal Hand No depletion  Patellar Region Unable to assess  Anterior Thigh Region Unable to assess  Posterior Calf Region Unable to assess  Edema (RD Assessment) Unable to assess  Hair Reviewed  Eyes Reviewed  Mouth Reviewed  Skin Reviewed  Nails Reviewed     Diet Order:   Diet Order            Diet NPO time specified Except for: Sips with Meds  Diet effective midnight           Diet renal/carb modified with fluid restriction Diet-HS Snack? Nothing; Fluid restriction: 1200 mL Fluid; Room service appropriate? Yes; Fluid consistency: Thin  Diet effective now                 EDUCATION NEEDS:   Education needs have been addressed  Skin:  Skin Assessment: Reviewed RN Assessment  Last BM:  4/11  Height:   Ht Readings from Last 1 Encounters:  08/08/20 '5\' 3"'$  (1.6 m)    Weight:   Wt Readings from Last 1 Encounters:  08/09/20 98.8 kg    BMI:  Body mass index is 38.58 kg/m.  Estimated Nutritional Needs:   Kcal:  2100-2300 kcal  Protein:  105-120 grams  Fluid:  1200 ml + UOP  Mariana Single RD, LDN Clinical Nutrition Pager listed in [place]

## 2020-08-09 NOTE — Progress Notes (Signed)
PROGRESS NOTE    Christy Zhang  [name] DOB: 09-Jun-1955 DOA: 08/08/2020 PCP: Claretta Fraise, MD   Chief Complaint  Patient presents with  . Foot Pain  Brief Narrative: 65 year old female with significant complex medical history of ESRD on HD insulin-dependent diabetes, hypertension, chronic pain, peripheral arterial disease, right foot infection presents to the ED with progressive discoloration and dryness of the right foot.    As per to report  patient " has been following with vascular surgery, was scheduled for arteriogram on 08/10/2020, and had just completed a course of Keflex, but continued to have progressive discoloration of the right forefoot with increasing drainage from the third toe, prompting her presentation to the ED.  She denies any fevers, chills, or pain.  She reports completing dialysis without incident yesterday.  She denies any chest pain, cough, or shortness of breath.  ED Course: Upon arrival to the ED, patient is found to be afebrile, saturating well on room air, and with blood pressure 175/63.  EKG features a sinus rhythm.  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  Blood culture was collected in the ED, cefepime and vancomycin were administered, vascular surgery was consulted by the ED physician, and hospitalists asked to admit"  Subjective: Seen and examined this morning, no pain on the foot.  Resting comfortably on room air. Overnight afebrile saturating well on room air CRP at 19, prealbumin level potassium BUN is stable  Assessment & Plan:  Gangrenous right mid foot with extensive wound Was  being followed by vascular surgery.  Presenting with worsening wound in the setting of PVD ESRD diabetes.  Vascular surgery input appreciated will need at least transmetatarsal amputation, scheduled for Thursday.  Continue empiric antibiotics-vancomycin Flagyl and ceftriaxone, follow-up culture data .  Will need ID evaluation.  CRP is elevated. Recent Labs  Lab 08/08/20 1855 08/09/20 0348  WBC 18.3* 16.9*  LATICACIDVEN 1.4  --    lft BKA recently- cont ace wrao, nursign dressing.  ESRD and HD last HD 4/11, nephrology consulted for dialysis while here.  Potassium and bicarb BUN is stable. Secondary hyperparathyroidism monitor calcium and phosphorus.  Continue Renvela.    PVD continue aspirin Plavix and statin.  Defer to vascular regarding whether to hold antiplatelets.  Diabetes mellitus insulin-dependent with CKD with long-term insulin, poorly controlled hyperglycemia.  Hemoglobin A1c 8.8 uncontrolled.  Add Lantus 10 units, continue sliding scale insulin and monitor No results for input(s): GLUCAP in the last 168 hours.  Essential (primary) hypertension: Fairly controlled on amlodipine, metoprolol.  Monitor blood pressure Anemia of chronic renal disease.  Monitor hemoglobin Recent Labs  Lab 08/08/20 1855 08/09/20 0348  HGB 9.2* 8.6*  HCT 31.3* 29.1*   Thrombocytosis: Likely from osteomyelitis.  Morbid obesity with BMI 38.  Will benefit with weight loss and PCP follow-up.  Diet Order            Diet NPO time specified Except for: Sips with Meds  Diet effective midnight           Diet renal/carb modified with fluid restriction Diet-HS Snack? Nothing; Fluid restriction: 1200 mL Fluid; Room service appropriate? Yes; Fluid consistency: Thin  Diet effective now  Patient's Body mass index is 38.09 kg/m. DVT prophylaxis: heparin injection 5,000 Units Start: 08/09/20 1400 Code Status:   Code Status: Full Code  Family Communication: plan of care discussed with patient  at bedside.  Status is: Inpatient Remains inpatient appropriate because:IV treatments appropriate due to intensity of illness or inability to take PO and Inpatient level of care appropriate due to severity of illness  Dispo: The patient is from: Home              Anticipated d/c is to: TBD              Patient currently is not medically stable to d/c.   Difficult to place patient No   Unresulted Labs (From admission, onward)          Start     Ordered   08/10/20 0500  Protime-INR  Tomorrow morning,   R        08/09/20 0734   08/10/20 [id number]  Basic metabolic panel  Tomorrow morning,   R        08/09/20 0734   08/10/20 0500  CBC  Tomorrow morning,   R        08/09/20 0734   08/09/20 0500  Sedimentation rate  Daily,   R      08/09/20 0054   08/09/20 0500  C-reactive protein  Daily,   R      08/09/20 0054   08/09/20 0500  CBC  Daily,   R      08/09/20 0054   08/09/20 [id number]  Basic metabolic panel  Daily,   R      08/09/20 0054   08/08/20 1839  Blood culture (routine x 2)  BLOOD CULTURE X 2,   STAT      08/08/20 1839        Medications reviewed:  Scheduled Meds: . (feeding supplement) PROSource Plus  30 mL Oral BID BM  . amLODipine  5 mg Oral QHS  . aspirin EC  81 mg Oral QHS  . atorvastatin  40 mg Oral Daily  . clopidogrel  75 mg Oral QPM  . gabapentin  300 mg Oral QHS  . heparin  5,000 Units Subcutaneous Q8H  . insulin aspart  0-6 Units Subcutaneous TID WC  . metoprolol succinate  50 mg Oral Daily  . metroNIDAZOLE  500 mg Oral Q8H  . polyethylene glycol  17 g Oral Daily  . senna-docusate  1 tablet Oral BID  . sevelamer carbonate  2,400 mg Oral TID WC   Continuous Infusions: . cefTRIAXone (ROCEPHIN)  IV    . vancomycin      Consultants:see note  Procedures:see note  Antimicrobials: Anti-infectives (From admission, onward)   Start     Dose/Rate Route Frequency Ordered Stop   08/09/20 2200  cefTRIAXone (ROCEPHIN) 2 g in sodium chloride 0.9 % 100 mL IVPB        2  g 200 mL/hr over 30 Minutes Intravenous Every 24 hours 08/09/20 0054     08/09/20 1200  vancomycin (VANCOREADY) IVPB 1000 mg/200 mL        1,000 mg 200 mL/hr over 60 Minutes Intravenous Every M-W-F (Hemodialysis) 08/08/20 2217     08/09/20 1200  ceFEPIme (MAXIPIME) 2 g in sodium chloride 0.9 % 100 mL IVPB  Status:  Discontinued        2 g 200 mL/hr over 30 Minutes Intravenous Every M-W-F (Hemodialysis) 08/08/20 2217 08/09/20 0054   08/09/20 0100  metroNIDAZOLE (FLAGYL) tablet  500 mg        500 mg Oral Every 8 hours 08/09/20 0054     08/08/20 2200  vancomycin (VANCOREADY) IVPB 2000 mg/400 mL        2,000 mg 200 mL/hr over 120 Minutes Intravenous  Once 08/08/20 2145 08/09/20 0143   08/08/20 2200  ceFEPIme (MAXIPIME) 2 g in sodium chloride 0.9 % 100 mL IVPB        2 g 200 mL/hr over 30 Minutes Intravenous  Once 08/08/20 2145 08/08/20 2333   08/08/20 2145  vancomycin (VANCOREADY) IVPB 1000 mg/200 mL  Status:  Discontinued        1,000 mg 200 mL/hr over 60 Minutes Intravenous  Once 08/08/20 2136 08/08/20 2145     Culture/Microbiology    Component Value Date/Time   SDES BLOOD LEFT ANTECUBITAL 06/22/2020 1740   SPECREQUEST  06/22/2020 1740    BOTTLES DRAWN AEROBIC AND ANAEROBIC Blood Culture results may not be optimal due to an inadequate volume of blood received in culture bottles   CULT  06/22/2020 1740    NO GROWTH 5 DAYS Performed at [facility] [address], [place], [place] [address]    REPTSTATUS 06/27/2020 FINAL 06/22/2020 1740    Other culture-see note  Objective: Vitals: Today's Vitals   08/09/20 0200 08/09/20 0230 08/09/20 0545 08/09/20 0745  BP: (!) 154/44 (!) 149/47 (!) 151/66 130/64  Pulse:  74 75 71  Resp: '16 20 17 13  '$ Temp:      TempSrc:      SpO2: 98% 99% 98% 92%  Weight:      Height:      PainSc:   0-No pain    No intake or output data in the 24 hours ending 08/09/20 0809 Filed Weights   08/08/20 1842  Weight: 97.5 kg   Weight change:    Intake/Output from previous day: No intake/output data recorded. Intake/Output this shift: No intake/output data recorded. Filed Weights   08/08/20 1842  Weight: 97.5 kg    Examination: General exam: AAOX3,NAD, weak appearing. HEENT:Oral mucosa moist, Ear/Nose WNL grossly,dentition normal. Respiratory system: bilaterally diminished,no use of accessory muscle, non tender. Cardiovascular system: S1 & S2 +, regular, No JVD. Gastrointestinal system: Abdomen soft, NT,ND, BS+. Nervous System:Alert, awake, moving extremities and grossly nonfocal Extremities: Right foot in dressing left BKA dressing intact-did not remove vascular planning for intervention tomorrow no edema, distal peripheral pulses palpable.  Skin: No rashes,no icterus. MSK: Normal muscle bulk,tone, power  Data Reviewed: I have personally reviewed following labs and imaging studies CBC: Recent Labs  Lab 08/08/20 1855 08/09/20 0348  WBC 18.3* 16.9*  NEUTROABS 14.3*  --   HGB 9.2* 8.6*  HCT 31.3* 29.1*  MCV 84.6 82.9  PLT 894* 0000000*   Basic Metabolic Panel: Recent Labs  Lab 08/08/20 1855 08/09/20 0348  NA 136 135  K 4.7 4.3  CL 96* 98  CO2 26 25  GLUCOSE 166* 93  BUN 20 22  CREATININE 7.67* 8.25*  CALCIUM 8.9 8.7*  MG 1.9  --    GFR: Estimated Creatinine Clearance: 7.8 mL/min (A) (by C-G formula based on SCr of 8.25 mg/dL (H)). Liver Function Tests: Recent Labs  Lab 08/08/20 1855  AST 10*  ALT 7  ALKPHOS 63  BILITOT 0.6  PROT 8.0  ALBUMIN 2.6*   No results for input(s): LIPASE, AMYLASE in the last 168 hours. No results for input(s): AMMONIA in the last 168 hours. Coagulation Profile: No results for input(s): INR,  PROTIME in the last 168 hours. Cardiac Enzymes: No results for input(s): CKTOTAL, CKMB, CKMBINDEX, TROPONINI in the last 168 hours. BNP (last 3 results) No results for input(s): PROBNP in the last 8760 hours. HbA1C: Recent Labs    08/09/20 0348 08/09/20 0523  HGBA1C 8.8* 6.5*    CBG: No results for input(s): GLUCAP in the last 168 hours. Lipid Profile: No results for input(s): CHOL, HDL, LDLCALC, TRIG, CHOLHDL, LDLDIRECT in the last 72 hours. Thyroid Function Tests: No results for input(s): TSH, T4TOTAL, FREET4, T3FREE, THYROIDAB in the last 72 hours. Anemia Panel: No results for input(s): VITAMINB12, FOLATE, FERRITIN, TIBC, IRON, RETICCTPCT in the last 72 hours. Sepsis Labs: Recent Labs  Lab 08/08/20 1855  LATICACIDVEN 1.4    Recent Results (from the past 240 hour(s))  SARS CORONAVIRUS 2 (TAT 6-24 HRS) Nasopharyngeal Nasopharyngeal Swab     Status: None   Collection Time: 08/08/20 10:31 PM   Specimen: Nasopharyngeal Swab  Result Value Ref Range Status   SARS Coronavirus 2 NEGATIVE NEGATIVE Final    Comment: (NOTE) SARS-CoV-2 target nucleic acids are NOT DETECTED.  The SARS-CoV-2 RNA is generally detectable in upper and lower respiratory specimens during the acute phase of infection. Negative results do not preclude SARS-CoV-2 infection, do not rule out co-infections with other pathogens, and should not be used as the sole basis for treatment or other patient management decisions. Negative results must be combined with clinical observations, patient history, and epidemiological information. The expected result is Negative.  Fact Sheet for Patients: SugarRoll.be  Fact Sheet for Healthcare Providers: https://www.woods-mathews.com/  This test is not yet approved or cleared by the [place] FDA and  has been authorized for detection and/or diagnosis of SARS-CoV-2 by FDA under an Emergency Use Authorization (EUA). This EUA will remain  in effect (meaning this test can be used) for the duration of the COVID-19 declaration under Se ction 564(b)(1) of the Act, 21 U.S.C. section 360bbb-3(b)(1), unless the authorization is terminated or revoked sooner.  Performed at [facility], [place] [address],  [place], [place] [address]      Radiology Studies: DG Foot 2 Views Right  Result Date: 08/08/2020 CLINICAL DATA:  Redness and drainage right foot EXAM: RIGHT FOOT - 2 VIEW COMPARISON:  02/22/2020 FINDINGS: Bones appear osteopenic. Old fracture deformity base of fifth metatarsal. Apparent partial amputation of third digit with residual proximal phalanx. Slight erosive change at the cut end of the proximal phalanx. Suspected bony resorption of the second distal phalanx. Possible erosions at the base of the second and third proximal phalanges and head of third metatarsal. Vascular calcifications. 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 IMPRESSION: 1. Apparent interval partial amputation third digit with remnant proximal phalanx. Findings suspicious for osteomyelitis involving the distal phalanx of the second digit and the cut end of the residual third proximal phalanx. There may be additional erosive change involving the bases of the second and third proximal phalanges and head of third metatarsal. 2. 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 Electronically Signed   By: Donavan Foil M.D.   On: 08/08/2020 21:01     LOS: 1 day   Antonieta Pert, MD [facility]  08/09/2020, 8:09 AM

## 2020-08-09 NOTE — Consult Note (Signed)
CLI RN Navigator consult acknowledged and chart reviewed.  65 y/o female receiving IV antibiotics for gangrene/osteomyelitis of right foot. Vascular is following this patient and currently proposing right TMA. She is scheduled for an arteriogram with Dr Carlis Abbott tomorrow.   History significant for  PVD, HLD, HTN, ESRD on dialysis and DM. Former smoker.  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  Duplex 09/02/19- "Probable tibial vessel disease" Aortogram 02/23/20 Right lower extremity runoff showing- "diffuse moderate disease". Arteriogram scheduled for 08/10/20  TOC, Registered Dietitian and Diabetes Coordinator all following this patient.   Will continue to follow as care transitions and barriers are identified.   Thank you.  Cletis Media RN BSN CWS CLI Engineer, site [employer] [phone]

## 2020-08-09 NOTE — ED Notes (Signed)
Per Lab please disregard A1c that resulted at 5:11. Lab mislabeled, results incorrect.

## 2020-08-09 NOTE — Consult Note (Addendum)
Reason for Consult: ESRD  Referring Physician: Dr. Christia Reading Opyd  Chief Complaint: Rt foot wound  Dialysis Orders: [facility] MWF 4hrs 2K 2.5Ca EDW 97 kg RU AVG Heparin 1000 load and 1000 units qhr EPO 14000 units qHD Venofer '100mg'$  qHD Hectorol 1.84mg qHD  Assessment/Plan:  1. 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 Ischemic rt 2nd and 3rd toes and wound in rt foot. 2. ESRD: Continue HD per MWF schedulevia AVG- next  hd today Seen on HD @ 1pm Rt AVG  149/58 with no complaints Goal 2L net UF 2/2.5 bath  3. HTN/ volume: B Restart home meds (usually on metoprolol and norvasc) and UF on RRT will help as well.  4. DM2- peradmitting team. 5. Anemiaof ESRD:  Aranesp  200 qFriday and not candidate for IV iron with active infection. Transfuse as needed as she may not respond to ESA with an active infection. 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 Continue renvela and hectorol.    Nutrition - Renal diet with fluid restrictions. Protein supplements,  HPI: Christy FORNSHELLis an 65y.o. female DM HTN chronic pain [facility]. She had a Lt BKA on 2/10 and also noted to have dry gangrene of the 2nd and 3rd toes at that time. Unfortunately she was noted to have progressive discoloration and drainage involving the right foot 1st seen by a visiting nurse at home. She had been scheduled for an arteriogram on 08/10/2020 and finished a recent course of Keflex but there was progressive worsening of the right forefoot with drainage from the 3rd toe as well. She denies fever chills nausea or pain. She also denies shortness of breath or CP. She was found to have findings suspicious for osteomyelitis and started on cefepime and vancomycin.   ROS Pertinent items are noted in HPI.  Chemistry and CBC: Creatinine, Ser  Date/Time Value Ref Range Status  08/09/2020  03:48 AM 8.25 (H) 0.44 - 1.00 mg/dL Final  08/08/2020 06:55 PM 7.67 (H) 0.44 - 1.00 mg/dL Final  07/20/2020 02:38 PM 7.95 (HH) 0.57 - 1.00 mg/dL Final    Comment:                      Client Requested Flag  07/03/2020 06:20 AM 10.64 (H) 0.44 - 1.00 mg/dL Final  06/30/2020 02:07 PM 9.60 (H) 0.44 - 1.00 mg/dL Final  06/28/2020 02:07 PM 8.68 (H) 0.44 - 1.00 mg/dL Final  06/26/2020 12:55 PM 10.63 (H) 0.44 - 1.00 mg/dL Final  06/23/2020 12:26 PM 9.28 (H) 0.44 - 1.00 mg/dL Final  06/22/2020 05:06 AM 6.82 (H) 0.44 - 1.00 mg/dL Final  06/21/2020 02:26 PM 9.33 (H) 0.44 - 1.00 mg/dL Final  06/20/2020 05:21 AM 7.09 (H) 0.44 - 1.00 mg/dL Final  06/19/2020 02:13 PM 10.15 (H) 0.44 - 1.00 mg/dL Final  06/17/2020 07:27 AM 11.13 (H) 0.44 - 1.00 mg/dL Final  06/16/2020 06:15 PM 10.59 (H) 0.44 - 1.00 mg/dL Final  06/16/2020 01:33 AM 9.37 (H) 0.44 - 1.00 mg/dL Final  06/15/2020 02:01 AM 7.46 (H) 0.44 - 1.00 mg/dL Final  06/14/2020 04:51 AM 8.63 (H) 0.44 - 1.00 mg/dL Final  06/13/2020 03:05 AM 11.29 (H) 0.44 - 1.00 mg/dL Final  06/12/2020 04:05 AM 9.78 (H) 0.44 - 1.00 mg/dL Final  06/11/2020 02:39 AM 8.64 (H) 0.44 - 1.00 mg/dL Final  06/10/2020 02:10  AM 6.88 (H) 0.44 - 1.00 mg/dL Final  06/09/2020 01:51 AM 7.63 (H) 0.44 - 1.00 mg/dL Final  06/08/2020 08:12 AM 5.40 (H) 0.44 - 1.00 mg/dL Final  06/08/2020 05:50 AM 6.21 (H) 0.44 - 1.00 mg/dL Final  04/06/2020 11:13 AM 5.87 (HH) 0.57 - 1.00 mg/dL Final    Comment:                      Client Requested Flag **Verified by repeat analysis**   03/09/2020 11:58 AM 6.14 (HH) 0.57 - 1.00 mg/dL Final    Comment:                      Client Requested Flag  02/26/2020 02:58 AM 5.37 (H) 0.44 - 1.00 mg/dL Final  02/25/2020 01:11 AM 7.46 (H) 0.44 - 1.00 mg/dL Final  02/23/2020 12:49 AM 7.56 (H) 0.44 - 1.00 mg/dL Final  02/22/2020 11:52 AM 6.76 (H) 0.44 - 1.00 mg/dL Final  01/06/2020 10:14 AM 7.28 (HH) 0.57 - 1.00 mg/dL Final    Comment:                      Client  Requested Flag **Verified by repeat analysis**   12/30/2019 05:41 AM 6.13 (H) 0.44 - 1.00 mg/dL Final    Comment:    DELTA CHECK NOTED  12/29/2019 10:47 AM 9.53 (H) 0.44 - 1.00 mg/dL Final  11/18/2019 06:43 AM 7.40 (H) 0.44 - 1.00 mg/dL Final  10/15/2019 07:05 AM 9.29 (H) 0.44 - 1.00 mg/dL Final  10/14/2019 05:46 PM 8.04 (H) 0.44 - 1.00 mg/dL Final  08/17/2019 02:50 PM 7.38 (HH) 0.57 - 1.00 mg/dL Final    Comment:                      Client Requested Flag  03/18/2019 08:22 AM 5.90 (H) 0.44 - 1.00 mg/dL Final  02/16/2019 02:29 PM 5.87 (HH) 0.57 - 1.00 mg/dL Final    Comment:                      Client Requested Flag  12/08/2018 09:24 AM 6.80 (H) 0.44 - 1.00 mg/dL Final  12/03/2018 10:46 AM 10.10 (H) 0.44 - 1.00 mg/dL Final  09/08/2018 09:03 AM 6.26 (HH) 0.57 - 1.00 mg/dL Final    Comment:                      Client Requested Flag  07/14/2018 11:53 AM 5.57 (HH) 0.57 - 1.00 mg/dL Final    Comment:                      Client Requested Flag  10/14/2017 10:55 AM 5.05 (HH) 0.57 - 1.00 mg/dL Final    Comment:                      Client Requested [place]  06/19/2017 09:12 AM 5.46 (HH) 0.57 - 1.00 mg/dL Final    Comment:                      Client Requested Flag  02/04/2017 07:08 AM 5.80 (H) 0.44 - 1.00 mg/dL Final  08/06/2016 11:02 AM 4.95 (>) 0.57 - 1.00 mg/dL Final    Comment:                      Client Requested [place]  04/16/2016 10:51 AM 4.81 (>)  0.57 - 1.00 mg/dL Final    Comment:                      Client Requested [place]  01/16/2016 07:50 AM 4.50 (H) 0.44 - 1.00 mg/dL Final  07/25/2015 10:52 AM 5.36 (>) 0.57 - 1.00 mg/dL Final    Comment:                      Client Requested [place]  03/30/2015 02:55 PM 5.61 (>) 0.57 - 1.00 mg/dL Final    Comment:                      Client Requested Flag   Recent Labs  Lab 08/08/20 1855 08/09/20 0348  NA 136 135  K 4.7 4.3  CL 96* 98  CO2 26 25  GLUCOSE 166* 93  BUN 20 22  CREATININE 7.67* 8.25*  CALCIUM 8.9 8.7*   Recent  Labs  Lab 08/08/20 1855 08/09/20 0348  WBC 18.3* 16.9*  NEUTROABS 14.3*  --   HGB 9.2* 8.6*  HCT 31.3* 29.1*  MCV 84.6 82.9  PLT 894* 782*   Liver Function Tests: Recent Labs  Lab 08/08/20 1855  AST 10*  ALT 7  ALKPHOS 63  BILITOT 0.6  PROT 8.0  ALBUMIN 2.6*   No results for input(s): LIPASE, AMYLASE in the last 168 hours. No results for input(s): AMMONIA in the last 168 hours. Cardiac Enzymes: No results for input(s): CKTOTAL, CKMB, CKMBINDEX, TROPONINI in the last 168 hours. Iron Studies: No results for input(s): IRON, TIBC, TRANSFERRIN, FERRITIN in the last 72 hours. PT/INR: '@LABRCNTIP'$ (inr:5)  Xrays/Other Studies: ) Results for orders placed or performed during the hospital encounter of 08/08/20 (from the past 48 hour(s))  Comprehensive metabolic panel     Status: Abnormal   Collection Time: 08/08/20  6:55 PM  Result Value Ref Range   Sodium 136 135 - 145 mmol/L   Potassium 4.7 3.5 - 5.1 mmol/L   Chloride 96 (L) 98 - 111 mmol/L   CO2 26 22 - 32 mmol/L   Glucose, Bld 166 (H) 70 - 99 mg/dL    Comment: Glucose reference range applies only to samples taken after fasting for at least 8 hours.   BUN 20 8 - 23 mg/dL   Creatinine, Ser 7.67 (H) 0.44 - 1.00 mg/dL   Calcium 8.9 8.9 - 10.3 mg/dL   Total Protein 8.0 6.5 - 8.1 g/dL   Albumin 2.6 (L) 3.5 - 5.0 g/dL   AST 10 (L) 15 - 41 U/L   ALT 7 0 - 44 U/L   Alkaline Phosphatase 63 38 - 126 U/L   Total Bilirubin 0.6 0.3 - 1.2 mg/dL   GFR, Estimated 5 (L) >60 mL/min    Comment: (NOTE) Calculated using the CKD-EPI Creatinine Equation (2021)    Anion gap 14 5 - 15    Comment: Performed at [facility] [address], [place], [place] [address]  Lactic acid, plasma     Status: None   Collection Time: 08/08/20  6:55 PM  Result Value Ref Range   Lactic Acid, Venous 1.4 0.5 - 1.9 mmol/L    Comment: Performed at [facility] [address] [place], [place] [address]  CBC with Differential     Status:  Abnormal   Collection Time: 08/08/20  6:55 PM  Result Value Ref Range   WBC 18.3 (H) 4.0 - 10.5 K/uL  RBC 3.70 (L) 3.87 - 5.11 MIL/uL   Hemoglobin 9.2 (L) 12.0 - 15.0 g/dL   HCT 31.3 (L) 36.0 - 46.0 %   MCV 84.6 80.0 - 100.0 fL   MCH 24.9 (L) 26.0 - 34.0 pg   MCHC 29.4 (L) 30.0 - 36.0 g/dL   RDW 18.2 (H) 11.5 - 15.5 %   Platelets 894 (H) 150 - 400 K/uL   nRBC 0.0 0.0 - 0.2 %   Neutrophils Relative % 77 %   Neutro Abs 14.3 (H) 1.7 - 7.7 K/uL   Lymphocytes Relative 11 %   Lymphs Abs 2.0 0.7 - 4.0 K/uL   Monocytes Relative 6 %   Monocytes Absolute 1.1 (H) 0.1 - 1.0 K/uL   Eosinophils Relative 4 %   Eosinophils Absolute 0.7 (H) 0.0 - 0.5 K/uL   Basophils Relative 1 %   Basophils Absolute 0.1 0.0 - 0.1 K/uL   Immature Granulocytes 1 %   Abs Immature Granulocytes 0.15 (H) 0.00 - 0.07 K/uL    Comment: Performed at [facility], [address] [place], [place] [address]  Magnesium     Status: None   Collection Time: 08/08/20  6:55 PM  Result Value Ref Range   Magnesium 1.9 1.7 - 2.4 mg/dL    Comment: Performed at [facility], [place] [address] [place], [place] [address]  Blood culture (routine x 2)     Status: None (Preliminary result)   Collection Time: 08/08/20  8:44 PM   Specimen: BLOOD  Result Value Ref Range   Specimen Description BLOOD SITE NOT SPECIFIED    Special Requests      BOTTLES DRAWN AEROBIC AND ANAEROBIC Blood Culture results may not be optimal due to an inadequate volume of blood received in culture bottles   Culture      NO GROWTH < 12 HOURS Performed at [facility], [place] [address] [place], [place] [address]    Report Status PENDING   SARS CORONAVIRUS 2 (TAT 6-24 HRS) Nasopharyngeal Nasopharyngeal Swab     Status: None   Collection Time: 08/08/20 10:31 PM   Specimen: Nasopharyngeal Swab  Result Value Ref Range   SARS Coronavirus 2 NEGATIVE NEGATIVE    Comment: (NOTE) SARS-CoV-2 target nucleic acids are NOT DETECTED.  The SARS-CoV-2 RNA  is generally detectable in upper and lower respiratory specimens during the acute phase of infection. Negative results do not preclude SARS-CoV-2 infection, do not rule out co-infections with other pathogens, and should not be used as the sole basis for treatment or other patient management decisions. Negative results must be combined with clinical observations, patient history, and epidemiological information. The expected result is Negative.  Fact Sheet for Patients: SugarRoll.be  Fact Sheet for Healthcare Providers: https://www.woods-mathews.com/  This test is not yet approved or cleared by the [place] FDA and  has been authorized for detection and/or diagnosis of SARS-CoV-2 by FDA under an Emergency Use Authorization (EUA). This EUA will remain  in effect (meaning this test can be used) for the duration of the COVID-19 declaration under Se ction 564(b)(1) of the Act, 21 U.S.C. section 360bbb-3(b)(1), unless the authorization is terminated or revoked sooner.  Performed at [facility], [place] [address] [place], [place] [address]   Hemoglobin A1c     Status: Abnormal   Collection Time: 08/09/20  3:48 AM  Result Value Ref Range   Hgb A1c MFr Bld 8.8 (H) 4.8 - 5.6 %    Comment:  QUESTIONABLE IDENTIFICATION / INCORRECTLY LABELED SPECIMEN CALLED TO J.LYON,RN 0527 08/09/2020 M.CAMPBELL (NOTE) Pre diabetes:          5.7%-6.4%  Diabetes:              >6.4%  Glycemic control for   <7.0% adults with diabetes    Mean Plasma Glucose 205.86 mg/dL    Comment: LABL Performed at [facility] [address] [place], [place] [address]   Sedimentation rate     Status: Abnormal   Collection Time: 08/09/20  3:48 AM  Result Value Ref Range   Sed Rate 83 (H) 0 - 22 mm/hr    Comment: Performed at [facility] [address], [place], [place] [address]  C-reactive protein     Status: Abnormal   Collection Time: 08/09/20   3:48 AM  Result Value Ref Range   CRP 19.0 (H) <1.0 mg/dL    Comment: Performed at [facility] [address] [place], [place] [address]  Prealbumin     Status: Abnormal   Collection Time: 08/09/20  3:48 AM  Result Value Ref Range   Prealbumin 11.6 (L) 18 - 38 mg/dL    Comment: Performed at [facility] [address] [place], [place] [address]  CBC     Status: Abnormal   Collection Time: 08/09/20  3:48 AM  Result Value Ref Range   WBC 16.9 (H) 4.0 - 10.5 K/uL   RBC 3.51 (L) 3.87 - 5.11 MIL/uL   Hemoglobin 8.6 (L) 12.0 - 15.0 g/dL   HCT 29.1 (L) 36.0 - 46.0 %   MCV 82.9 80.0 - 100.0 fL   MCH 24.5 (L) 26.0 - 34.0 pg   MCHC 29.6 (L) 30.0 - 36.0 g/dL   RDW 18.3 (H) 11.5 - 15.5 %   Platelets 782 (H) 150 - 400 K/uL   nRBC 0.0 0.0 - 0.2 %    Comment: Performed at [facility], [place] [address], [place], [place] [id number]  Basic metabolic panel     Status: Abnormal   Collection Time: 08/09/20  3:48 AM  Result Value Ref Range   Sodium 135 135 - 145 mmol/L   Potassium 4.3 3.5 - 5.1 mmol/L   Chloride 98 98 - 111 mmol/L   CO2 25 22 - 32 mmol/L   Glucose, Bld 93 70 - 99 mg/dL    Comment: Glucose reference range applies only to samples taken after fasting for at least 8 hours.   BUN 22 8 - 23 mg/dL   Creatinine, Ser 8.25 (H) 0.44 - 1.00 mg/dL   Calcium 8.7 (L) 8.9 - 10.3 mg/dL   GFR, Estimated 5 (L) >60 mL/min    Comment: (NOTE) Calculated using the CKD-EPI Creatinine Equation (2021)    Anion gap 12 5 - 15    Comment: Performed at [facility] [address], [place],  [address]  Hemoglobin A1c     Status: Abnormal   Collection Time: 08/09/20  5:23 AM  Result Value Ref Range   Hgb A1c MFr Bld 6.5 (H) 4.8 - 5.6 %    Comment: (NOTE) Pre diabetes:          5.7%-6.4%  Diabetes:              >6.4%  Glycemic control for   <7.0% adults with diabetes    Mean Plasma Glucose 139.85 mg/dL    Comment: Performed at [facility]  [address], [place], [place] [address]  CBG monitoring, ED     Status: None   Collection Time: 08/09/20  8:21 AM  Result Value Ref Range   Glucose-Capillary 82 70 - 99 mg/dL    Comment: Glucose reference range applies only to samples taken after fasting for at least 8 hours.   DG Foot 2 Views Right  Result Date: 08/08/2020 CLINICAL DATA:  Redness and drainage right foot EXAM: RIGHT FOOT - 2 VIEW COMPARISON:  02/22/2020 FINDINGS: Bones appear osteopenic. Old fracture deformity base of fifth metatarsal. Apparent partial amputation of third digit with residual proximal phalanx. Slight erosive change at the cut end of the proximal phalanx. Suspected bony resorption of the second distal phalanx. Possible erosions at the base of the second and third proximal phalanges and head of third metatarsal. Vascular calcifications. 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 IMPRESSION: 1. Apparent interval partial amputation third digit with remnant proximal phalanx. Findings suspicious for osteomyelitis involving the distal phalanx of the second digit and the cut end of the residual third proximal phalanx. There may be additional erosive change involving the bases of the second and third proximal phalanges and head of third metatarsal. 2. 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 Electronically Signed   By: Donavan Foil M.D.   On: 08/08/2020 21:01    PMH:   Past Medical History:  Diagnosis Date  . Anemia   . Arthritis   . Asthma    in the past  . CHF (congestive heart failure) ([place])   . Chronic kidney disease    Renal failure, dialysis on M/W/F  . 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 End stage renal disease ([place])   . End stage renal disease on dialysis ([place])   . Gangrene ([place])   . Hemodialysis access, arteriovenous graft ([place])   . Hyperglycemia  due to diabetes mellitus ([place]) 10/15/2019  . Hyperlipidemia   . Hypertension   . Neuromuscular disorder (HCC)     PSH:   Past Surgical History:  Procedure Laterality Date  .  LEFT BELOW KNEE AMPUTATION (Left Knee)  06/08/2020  . A/V FISTULAGRAM Right 02/04/2017   Procedure: A/V Fistulagram;  Surgeon: Waynetta Sandy, MD;  Location: [facility];  Service: Cardiovascular;  Laterality: Right;  . ABDOMINAL AORTOGRAM W/LOWER EXTREMITY N/A 11/18/2019   Procedure: ABDOMINAL AORTOGRAM W/LOWER EXTREMITY;  Surgeon: [name], MD;  Location: [facility];  Service: Cardiovascular;  Laterality: N/A;  . ABDOMINAL AORTOGRAM W/LOWER EXTREMITY N/A 02/23/2020   Procedure: ABDOMINAL AORTOGRAM W/LOWER EXTREMITY;  Surgeon: [name], MD;  Location: [facility];  Service: Cardiovascular;  Laterality: N/A;  Bilateral  . AMPUTATION Left 06/08/2020   Procedure: LEFT BELOW KNEE AMPUTATION;  Surgeon: Cherre Robins, MD;  Location: [facility];  Service: Vascular;  Laterality: Left;  . AV FISTULA PLACEMENT Right 08/25/2014   Procedure: RIGHT BRACHIOCEPHALIC ARTERIOVENOUS (AV) FISTULA CREATION;  Surgeon: Mal Misty, MD;  Location: [facility];  Service: Vascular;  Laterality: Right;  . AV FISTULA PLACEMENT Right 03/18/2019   Procedure: INSERTION OF ARTERIOVENOUS (AV) GORE-TEX GRAFT RIGHT ARM;  Surgeon: Serafina Mitchell, MD;  Location: [facility];  Service: Vascular;  Laterality: Right;  . BASCILIC VEIN TRANSPOSITION Right 12/24/2018   Procedure: FIRST STAGE BASILIC VEIN TRANSPOSITION RIGHT UPPER ARM;  Surgeon: Harold Barban  W, MD;  Location: [facility];  Service: Vascular;  Laterality: Right;  . FISTULA SUPERFICIALIZATION Right 11/03/2014   Procedure: RIGHT UPPER ARM FISTULA SUPERFICIALIZATION;  Surgeon: Mal Misty, MD;  Location: [facility];  Service: Vascular;  Laterality: Right;  . FISTULOGRAM Right 01/16/2016   Procedure: RIGHT UPPER EXTREMITY ARTERIOVENOUS FISTULOGRAM WITH BALLOON  ANGIOPLASTY;  Surgeon: Vickie Epley, MD;  Location: [facility];  Service: Vascular;  Laterality: Right;  . INSERTION OF DIALYSIS CATHETER     X4  . IR FLUORO GUIDE CV LINE RIGHT  12/03/2018  . IR REMOVAL TUN CV CATH W/O FL  07/06/2019  . IR THROMBECTOMY AV FISTULA W/THROMBOLYSIS/PTA INC/SHUNT/IMG RIGHT Right 12/03/2018  . IR THROMBECTOMY AV FISTULA W/THROMBOLYSIS/PTA/STENT INC/SHUNT/IMG RT Right 10/11/2019  . IR US GUIDE VASC ACCESS RIGHT  12/03/2018  . IR US GUIDE VASC ACCESS RIGHT  12/03/2018  . IR US GUIDE VASC ACCESS RIGHT  10/14/2019  . LIGATION OF COMPETING BRANCHES OF ARTERIOVENOUS FISTULA Right 11/03/2014   Procedure: LIGATION OF COMPETING BRANCHE x1 OF RIGHT UPPER ARM ARTERIOVENOUS FISTULA;  Surgeon: Mal Misty, MD;  Location: [facility];  Service: Vascular;  Laterality: Right;  . PERIPHERAL VASCULAR BALLOON ANGIOPLASTY Right 02/04/2017   Procedure: PERIPHERAL VASCULAR BALLOON ANGIOPLASTY;  Surgeon: Waynetta Sandy, MD;  Location: [facility];  Service: Cardiovascular;  Laterality: Right;  . PERIPHERAL VASCULAR BALLOON ANGIOPLASTY Right 11/18/2019   Procedure: PERIPHERAL VASCULAR BALLOON ANGIOPLASTY;  Surgeon: [name], MD;  Location: [facility];  Service: Cardiovascular;  Laterality: Right;  anterior tibial  . PERIPHERAL VASCULAR BALLOON ANGIOPLASTY  02/23/2020   Procedure: PERIPHERAL VASCULAR BALLOON ANGIOPLASTY;  Surgeon: [name], MD;  Location: [facility];  Service: Cardiovascular;;  Lt  AT  . TRANSMETATARSAL AMPUTATION Left 02/24/2020   Procedure: TRANSMETATARSAL AMPUTATION LEFT;  Surgeon: Cherre Robins, MD;  Location: [facility];  Service: Vascular;  Laterality: Left;  . TUBAL LIGATION      Allergies:  Allergies  Allergen Reactions  . Sulfa Antibiotics Swelling    Medications:   Prior to Admission medications   Medication Sig Start Date End Date Taking? Authorizing Provider  acetaminophen (TYLENOL) 325 MG tablet Take 1-2 tablets  (325-650 mg total) by mouth every 4 (four) hours as needed for mild pain. 07/04/20  Yes Love, Ivan Anchors, PA-C  amLODipine (NORVASC) 5 MG tablet Take 1 tablet (5 mg total) by mouth at bedtime. 07/04/20  Yes Love, Ivan Anchors, PA-C  aspirin EC 81 MG tablet Take 81 mg by mouth at bedtime. Swallow whole.   Yes [provider]  atorvastatin (LIPITOR) 40 MG tablet Take 1 tablet (40 mg total) by mouth daily. Patient taking differently: Take 40 mg by mouth at bedtime. 08/17/19  Yes Claretta Fraise, MD  clopidogrel (PLAVIX) 75 MG tablet Take 1 tablet (75 mg total) by mouth daily. Patient taking differently: Take 75 mg by mouth every evening. 11/18/19 11/17/20 Yes [name], MD  diclofenac Sodium (VOLTAREN) 1 % GEL Apply 2 g topically 4 (four) times daily. 07/04/20  Yes Love, Ivan Anchors, PA-C  diphenhydrAMINE (BENADRYL) 25 MG tablet Take 25 mg by mouth every 6 (six) hours as needed for itching.   Yes [provider]  Dulaglutide (TRULICITY) 1.5 0000000 SOPN Inject 1.5 mg into the skin once a week. Saturday 04/09/20  Yes Claretta Fraise, MD  gabapentin (NEURONTIN) 300 MG capsule Take 1 capsule (300 mg total) by mouth at bedtime. 07/27/20  Yes Danella Sensing  L, NP  HYDROcodone-acetaminophen (NORCO/VICODIN) 5-325 MG tablet Take 1 tablet by mouth every 12 (twelve) hours as needed for severe pain. 07/04/20  Yes Love, Ivan Anchors, PA-C  insulin glargine, 1 Unit Dial, (TOUJEO) 300 UNIT/ML Solostar Pen Inject 16 Units into the skin daily. 07/04/20  Yes Love, Ivan Anchors, PA-C  metoprolol succinate (TOPROL-XL) 50 MG 24 hr tablet Take 1 tablet (50 mg total) by mouth daily. Take with or immediately following a meal. 07/04/20  Yes Love, Ivan Anchors, PA-C  multivitamin (RENA-VIT) TABS tablet Take 1 tablet by mouth at bedtime.    Yes [provider]  polyethylene glycol (MIRALAX / GLYCOLAX) 17 g packet Take 17 g by mouth daily. 06/17/20  Yes Regalado, Belkys A, MD  senna-docusate (SENOKOT-S) 8.6-50 MG tablet Take 1  tablet by mouth 2 (two) times daily. 07/04/20  Yes Love, Ivan Anchors, PA-C  sevelamer carbonate (RENVELA) 800 MG tablet Take 1,600-2,400 mg by mouth See admin instructions. Take 2400 mg with each meal and 1600 mg with each snack   Yes [provider]  traMADol (ULTRAM) 50 MG tablet Take 1 tablet (50 mg total) by mouth every 12 (twelve) hours as needed for moderate pain. 07/27/20  Yes Bayard Hugger, NP  Nutritional Supplements (,FEEDING SUPPLEMENT, PROSOURCE PLUS) liquid Take 30 mLs by mouth 2 (two) times daily between meals. 07/04/20   Bary Leriche, PA-C    Discontinued Meds:   Medications Discontinued During This Encounter  Medication Reason  . vancomycin (VANCOREADY) IVPB 1000 mg/200 mL   . ceFEPIme (MAXIPIME) 2 g in sodium chloride 0.9 % 100 mL IVPB   . HYDROcodone-acetaminophen (NORCO/VICODIN) 5-325 MG per tablet 1 tablet   . traMADol (ULTRAM) tablet 50 mg   . vancomycin (VANCOREADY) IVPB 1000 mg/200 mL     Social History:  reports that she quit smoking about 36 years ago. Her smoking use included cigarettes. She has never used smokeless tobacco. She reports that she does not drink alcohol and does not use drugs.  Family History:   Family History  Problem Relation Age of Onset  . Diabetes Mother   . Hypertension Mother   . Cancer Father   . Diabetes Father   . Hypertension Sister   . Diabetes Daughter   . Heart disease Daughter   . Hypertension Daughter   . Diabetes Son   . Heart disease Son   . Hypertension Son   . Peripheral vascular disease Son        amputation    Blood pressure (!) 148/58, pulse 77, temperature 98 F (36.7 C), temperature source Oral, resp. rate 16, height '5\' 3"'$  (1.6 m), weight 97.5 kg, SpO2 97 %.   General:  NAD, A&Ox3 Heart: RRR, no MRG Lungs: CTA, room air, nonlabored breathing Abdomen: Obese bowel sounds normoactive, NTND, mild abdominal edema Extremities: No pedal edema, left BKA brace/dressing, gangrene in the left foot Dialysis  Access: RU AVG loop graft positive bruit       Danniell Rotundo, Hunt Oris, MD 08/09/2020, 10:22 AM

## 2020-08-09 NOTE — Plan of Care (Signed)
  Problem: Activity: Goal: Risk for activity intolerance will decrease Outcome: Progressing   Problem: Nutrition: Goal: Adequate nutrition will be maintained Outcome: Progressing   Problem: Pain Managment: Goal: General experience of comfort will improve Outcome: Progressing   Problem: Safety: Goal: Ability to remain free from injury will improve Outcome: Progressing   

## 2020-08-09 NOTE — ED Notes (Signed)
Report given to [facility]

## 2020-08-09 NOTE — Progress Notes (Signed)
Inpatient Diabetes Program Recommendations  AACE/ADA: New Consensus Statement on Inpatient Glycemic Control   Target Ranges:  Prepandial:   less than 140 mg/dL      Peak postprandial:   less than 180 mg/dL (1-2 hours)      Critically ill patients:  140 - 180 mg/dL   Results for Christy Zhang, Christy Zhang (MRN [id number]) as of 08/09/2020 07:56  Ref. Range 08/08/2020 18:55 08/09/2020 03:48  Glucose Latest Ref Range: 70 - 99 mg/dL 166 (H) 93   Review of Glycemic Control  Diabetes history: DM2 Outpatient Diabetes medications: Trulicity 1.5 mg Qweek (Saturday), Toujeo 16 units daily Current orders for Inpatient glycemic control: Novolog 0-6 units TID with meals  Inpatient Diabetes Program Recommendations:    Insulin: Please consider ordering Lantus 10 units daily and Novolog 0-5 units QHS.  NOTE: Noted consult for diabetes coordinator per lower extremity wound order set. Chart reviewed. Patient admitted with right foot gangrene, osteomyelitis. Patient has hx of ESRD with HD. Patient was recently inpatient 06/08/20-06/16/20 and discharged to CIR on 06/16/20-07/04/20. When patient discharged from CIR, was prescribed Toujeo 16 units daily and Trulicity 1.5 mg Qweek (Saturday). Will follow along while inpatient.  Thanks, Barnie Alderman, RN, MSN, CDE Diabetes Coordinator Inpatient Diabetes Program [phone] (Team Pager from 8am to 5pm)

## 2020-08-09 NOTE — Progress Notes (Signed)
Seen in dialysis.  Discussed plan for right lower extremity arteriogram tomorrow in the Cath Lab.  Please keep n.p.o. after midnight.  [name], MD [facility] Office: [place]

## 2020-08-09 NOTE — TOC Initial Note (Signed)
Transition of Care [facility]) - Initial/Assessment Note    Patient Details  Name: Christy Zhang MRN: [id number] Date of Birth: Jun 08, 1955  Transition of Care [facility] CM/SW Contact:    Milinda Antis, [place] Phone Number: 08/09/2020, 11:44 AM  Clinical Narrative:                 CSW received consult for d/c planning.  Patient is a 65 y.o. female with medical history significant for ESRD on hemodialysis, insulin-dependent diabetes mellitus, hypertension, chronic pain, peripheral arterial disease, and right foot infection, now presenting to emergency department for evaluation of progressive discoloration and drainage involving the right foot   Patient is scheduled for an arteriogram on 08/10/2020.  CSW will be following patient and coordinating d/c needs.    Surgery and OT/PT recommendations pending.       Patient Goals and CMS Choice        Expected Discharge Plan and Services                                                Prior Living Arrangements/Services                       Activities of Daily Living Home Assistive Devices/Equipment: Cytogeneticist (specify type),Bedside commode/3-in-1 ADL Screening (condition at time of admission) Patient's cognitive ability adequate to safely complete daily activities?: Yes Is the patient deaf or have difficulty hearing?: No Does the patient have difficulty seeing, even when wearing glasses/contacts?: No Does the patient have difficulty concentrating, remembering, or making decisions?: No Patient able to express need for assistance with ADLs?: Yes Does the patient have difficulty dressing or bathing?: Yes Independently performs ADLs?: No Does the patient have difficulty walking or climbing stairs?: Yes  Permission Sought/Granted                  Emotional Assessment              Admission diagnosis:  Right foot pain [M79.671] Osteomyelitis of right foot ([place]) [M86.9] Osteomyelitis of right foot,  unspecified type [facility]) [M86.9] Patient Active Problem List   Diagnosis Date Noted  . Thrombocytosis 08/09/2020  . Osteomyelitis of right foot ([place]) 08/08/2020  . Leucocytosis 07/05/2020  . Below-knee amputation of left lower extremity ([place]) 06/16/2020  . S/P BKA (below knee amputation) unilateral ([place]) 06/08/2020  . Abdominal wall cellulitis   . Panniculitis 12/29/2019  . Osteomyelitis ([place]) 10/15/2019  . Osteomyelitis of third toe of right foot ([place]) 10/14/2019  . Controlled type 2 diabetes mellitus with chronic kidney disease on chronic dialysis, with long-term current use of insulin ([place]) 12/02/2016  . ESRD (end stage renal disease) ([place])   . Asthma, mild persistent 07/25/2015  . Hypertension secondary to other renal disorders 07/25/2015  . Anemia in chronic kidney disease 02/17/2015  . 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 Essential (primary) hypertension 06/24/2014  . Heart failure ([place]) 06/24/2014  . Obesity, Class III, BMI 40-49.9 (morbid obesity) ([place]) 06/24/2014   PCP:  Claretta Fraise, MD Pharmacy:   [place], [place] [address] Phone: [phone] Fax: [phone]     Social Determinants of Health (SDOH) Interventions    Readmission Risk Interventions No flowsheet data found.

## 2020-08-09 NOTE — H&P (Signed)
History and Physical    Christy Zhang [id number] DOB: 01/09/1956 DOA: 08/08/2020  PCP: Claretta Fraise, MD   Patient coming from: Home   Chief Complaint: Right foot discoloration and drainage   HPI: Christy Zhang is a 65 y.o. female with medical history significant for ESRD on hemodialysis, insulin-dependent diabetes mellitus, hypertension, chronic pain, peripheral arterial disease, and right foot infection, now presenting to emergency department for evaluation of progressive discoloration and drainage involving the right foot.  Patient has been following with vascular surgery, was scheduled for arteriogram on 08/10/2020, and had just completed a course of Keflex, but continued to have progressive discoloration of the right forefoot with increasing drainage from the third toe, prompting her presentation to the ED.  She denies any fevers, chills, or pain.  She reports completing dialysis without incident yesterday.  She denies any chest pain, cough, or shortness of breath.  ED Course: Upon arrival to the ED, patient is found to be afebrile, saturating well on room air, and with blood pressure 175/63.  EKG features a sinus rhythm.  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  Blood culture was collected in the ED, cefepime and vancomycin were administered, vascular surgery was consulted by the ED physician, and hospitalists asked to admit.  Review of Systems:  All other systems reviewed and apart from HPI, are negative.  Past Medical History:  Diagnosis Date  . Anemia   . Arthritis   . Asthma    in the past  . CHF (congestive  heart failure) ([place])   . Chronic kidney disease    Renal failure, dialysis on M/W/F  . 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 End stage renal disease ([place])   . End stage renal disease on dialysis ([place])   . Gangrene ([place])   . Hemodialysis access, arteriovenous graft ([place])   . Hyperglycemia due to diabetes mellitus ([place]) 10/15/2019  . Hyperlipidemia   . Hypertension   . Neuromuscular disorder [facility])     Past Surgical History:  Procedure Laterality Date  .  LEFT BELOW KNEE AMPUTATION (Left Knee)  06/08/2020  . A/V FISTULAGRAM Right 02/04/2017   Procedure: A/V Fistulagram;  Surgeon: Waynetta Sandy, MD;  Location: [place] [facility];  Service: Cardiovascular;  Laterality: Right;  . ABDOMINAL AORTOGRAM W/LOWER EXTREMITY N/A 11/18/2019   Procedure: ABDOMINAL AORTOGRAM W/LOWER EXTREMITY;  Surgeon: [name], MD;  Location: [place] [facility];  Service: Cardiovascular;  Laterality: N/A;  . ABDOMINAL AORTOGRAM W/LOWER EXTREMITY N/A 02/23/2020   Procedure: ABDOMINAL AORTOGRAM W/LOWER EXTREMITY;  Surgeon: [name], MD;  Location: [place] [facility];  Service: Cardiovascular;  Laterality: N/A;  Bilateral  . AMPUTATION Left 06/08/2020   Procedure: LEFT BELOW KNEE AMPUTATION;  Surgeon: Cherre [place]ins, MD;  Location: [place];  Service: Vascular;  Laterality: Left;  . AV FISTULA PLACEMENT Right 08/25/2014   Procedure: RIGHT BRACHIOCEPHALIC ARTERIOVENOUS (AV) FISTULA CREATION;  Surgeon: Mal Misty, MD;  Location: [place];  Service: Vascular;  Laterality: Right;  . AV FISTULA PLACEMENT Right 03/18/2019   Procedure: INSERTION OF ARTERIOVENOUS (AV) GORE-TEX GRAFT RIGHT ARM;  Surgeon: Serafina Mitchell, MD;  Location: [facility];  Service: Vascular;  Laterality: Right;  . BASCILIC VEIN TRANSPOSITION Right 12/24/2018   Procedure: FIRST STAGE BASILIC VEIN TRANSPOSITION RIGHT UPPER ARM;  Surgeon: Serafina Mitchell, MD;  Location: [facility];  Service: Vascular;  Laterality:  Right;  . FISTULA SUPERFICIALIZATION Right 11/03/2014   Procedure: RIGHT UPPER ARM FISTULA SUPERFICIALIZATION;  Surgeon: Mal Misty, MD;  Location: [place];  Service: Vascular;  Laterality: Right;  . FISTULOGRAM Right 01/16/2016   Procedure: RIGHT UPPER EXTREMITY ARTERIOVENOUS FISTULOGRAM WITH BALLOON ANGIOPLASTY;  Surgeon: Vickie Epley, MD;  Location: [facility];  Service: Vascular;  Laterality: Right;  . INSERTION OF DIALYSIS CATHETER     X4  . IR FLUORO GUIDE CV LINE RIGHT  12/03/2018  . IR REMOVAL TUN CV CATH W/O FL  07/06/2019  . IR THROMBECTOMY AV FISTULA W/THROMBOLYSIS/PTA INC/SHUNT/IMG RIGHT Right 12/03/2018  . IR THROMBECTOMY AV FISTULA W/THROMBOLYSIS/PTA/STENT INC/SHUNT/IMG RT Right 10/11/2019  . IR US GUIDE VASC ACCESS RIGHT  12/03/2018  . IR US GUIDE VASC ACCESS RIGHT  12/03/2018  . IR US GUIDE VASC ACCESS RIGHT  10/14/2019  . LIGATION OF COMPETING BRANCHES OF ARTERIOVENOUS FISTULA Right 11/03/2014   Procedure: LIGATION OF COMPETING BRANCHE x1 OF RIGHT UPPER ARM ARTERIOVENOUS FISTULA;  Surgeon: Mal Misty, MD;  Location: [place];  Service: Vascular;  Laterality: Right;  . PERIPHERAL VASCULAR BALLOON ANGIOPLASTY Right 02/04/2017   Procedure: PERIPHERAL VASCULAR BALLOON ANGIOPLASTY;  Surgeon: Waynetta Sandy, MD;  Location: [facility];  Service: Cardiovascular;  Laterality: Right;  . PERIPHERAL VASCULAR BALLOON ANGIOPLASTY Right 11/18/2019   Procedure: PERIPHERAL VASCULAR BALLOON ANGIOPLASTY;  Surgeon: [name], MD;  Location: [place] [facility];  Service: Cardiovascular;  Laterality: Right;  anterior tibial  . PERIPHERAL VASCULAR BALLOON ANGIOPLASTY  02/23/2020   Procedure: PERIPHERAL VASCULAR BALLOON ANGIOPLASTY;  Surgeon: [name], MD;  Location: [place] [facility];  Service: Cardiovascular;;  Lt  AT  . TRANSMETATARSAL AMPUTATION Left 02/24/2020   Procedure: TRANSMETATARSAL AMPUTATION LEFT;  Surgeon: Cherre [place], MD;  Location: [place];  Service:  Vascular;  Laterality: Left;  . TUBAL LIGATION      Social History:   reports that she quit smoking about 36 years ago. Her smoking use included cigarettes. She has never used smokeless tobacco. She reports that she does not drink alcohol and does not use drugs.  Allergies  Allergen Reactions  . Sulfa Antibiotics Swelling    Family History  Problem Relation Age of Onset  . Diabetes Mother   . Hypertension Mother   . Cancer Father   . Diabetes Father   . Hypertension Sister   . Diabetes Daughter   . Heart disease Daughter   . Hypertension Daughter   . Diabetes Son   . Heart disease Son   . Hypertension Son   . Peripheral vascular disease Son        amputation     Prior to Admission medications   Medication Sig Start Date End Date Taking? Authorizing Provider  acetaminophen (TYLENOL) 325 MG tablet Take 1-2 tablets (325-650 mg total) by mouth every 4 (four) hours as needed for mild pain. 07/04/20  Yes Love, Ivan Anchors, PA-C  amLODipine (NORVASC) 5 MG tablet Take 1 tablet (5 mg total) by mouth at bedtime. 07/04/20  Yes Love, Ivan Anchors, PA-C  aspirin EC 81 MG tablet Take 81 mg by mouth at bedtime. Swallow whole.   Yes [provider]  atorvastatin (LIPITOR) 40 MG tablet  Take 1 tablet (40 mg total) by mouth daily. Patient taking differently: Take 40 mg by mouth at bedtime. 08/17/19  Yes Claretta Fraise, MD  clopidogrel (PLAVIX) 75 MG tablet Take 1 tablet (75 mg total) by mouth daily. Patient taking differently: Take 75 mg by mouth every evening. 11/18/19 11/17/20 Yes [name], MD  diclofenac Sodium (VOLTAREN) 1 % GEL Apply 2 g topically 4 (four) times daily. 07/04/20  Yes Love, Ivan Anchors, PA-C  diphenhydrAMINE (BENADRYL) 25 MG tablet Take 25 mg by mouth every 6 (six) hours as needed for itching.   Yes [provider]  Dulaglutide (TRULICITY) 1.5 0000000 SOPN Inject 1.5 mg into the skin once a week. Saturday 04/09/20  Yes Claretta Fraise, MD  gabapentin (NEURONTIN)  300 MG capsule Take 1 capsule (300 mg total) by mouth at bedtime. 07/27/20  Yes Bayard Hugger, NP  HYDROcodone-acetaminophen (NORCO/VICODIN) 5-325 MG tablet Take 1 tablet by mouth every 12 (twelve) hours as needed for severe pain. 07/04/20  Yes Love, Ivan Anchors, PA-C  insulin glargine, 1 Unit Dial, (TOUJEO) 300 UNIT/ML Solostar Pen Inject 16 Units into the skin daily. 07/04/20  Yes Love, Ivan Anchors, PA-C  metoprolol succinate (TOPROL-XL) 50 MG 24 hr tablet Take 1 tablet (50 mg total) by mouth daily. Take with or immediately following a meal. 07/04/20  Yes Love, Ivan Anchors, PA-C  multivitamin (RENA-VIT) TABS tablet Take 1 tablet by mouth at bedtime.    Yes [provider]  polyethylene glycol (MIRALAX / GLYCOLAX) 17 g packet Take 17 g by mouth daily. 06/17/20  Yes Regalado, Belkys A, MD  senna-docusate (SENOKOT-S) 8.6-50 MG tablet Take 1 tablet by mouth 2 (two) times daily. 07/04/20  Yes Love, Ivan Anchors, PA-C  sevelamer carbonate (RENVELA) 800 MG tablet Take 1,600-2,400 mg by mouth See admin instructions. Take 2400 mg with each meal and 1600 mg with each snack   Yes [provider]  traMADol (ULTRAM) 50 MG tablet Take 1 tablet (50 mg total) by mouth every 12 (twelve) hours as needed for moderate pain. 07/27/20  Yes Bayard Hugger, NP  Nutritional Supplements (,FEEDING SUPPLEMENT, PROSOURCE PLUS) liquid Take 30 mLs by mouth 2 (two) times daily between meals. 07/04/20   Bary Leriche, PA-C    Physical Exam: Vitals:   08/08/20 1729 08/08/20 1842 08/08/20 2145  BP: (!) 159/63  (!) 175/63  Pulse: 82  86  Resp: 18  18  Temp: 99 F (37.2 C)    TempSrc: Oral    SpO2: 100%  99%  Weight:  97.5 kg   Height:  '5\' 3"'$  (1.6 m)     Constitutional: NAD, calm  Eyes: PERTLA, lids and conjunctivae normal ENMT: Mucous membranes are moist. Posterior pharynx clear of any exudate or lesions.   Neck: normal, supple, no masses, no thyromegaly Respiratory:  no wheezing, no crackles. No accessory muscle use.   Cardiovascular: S1 & S2 heard, regular rate and rhythm. No extremity edema.   Abdomen: No distension, no tenderness, soft. Bowel sounds active.  Musculoskeletal: Status-post left BKA and amputation distal 3rd toe on right. No joint deformity upper extremities.   Skin: Discoloration of right forefoot with swelling and drainage. UEs warm, dry, well-perfused. Neurologic: CN 2-12 grossly intact. Sensation to light touch diminished in lower RLE. Moving all extremities.  Psychiatric: Alert and oriented to person, place, and situation. Very pleasant and cooperative.    Labs and Imaging on Admission: I have personally reviewed following labs and imaging studies  CBC:  Recent Labs  Lab 08/08/20 1855  WBC 18.3*  NEUTROABS 14.3*  HGB 9.2*  HCT 31.3*  MCV 84.6  PLT 123XX123*   Basic Metabolic Panel: Recent Labs  Lab 08/08/20 1855  NA 136  K 4.7  CL 96*  CO2 26  GLUCOSE 166*  BUN 20  CREATININE 7.67*  CALCIUM 8.9  MG 1.9   GFR: Estimated Creatinine Clearance: 8.3 mL/min (A) (by C-G formula based on SCr of 7.67 mg/dL (H)). Liver Function Tests: Recent Labs  Lab 08/08/20 1855  AST 10*  ALT 7  ALKPHOS 63  BILITOT 0.6  PROT 8.0  ALBUMIN 2.6*   No results for input(s): LIPASE, AMYLASE in the last 168 hours. No results for input(s): AMMONIA in the last 168 hours. Coagulation Profile: No results for input(s): INR, PROTIME in the last 168 hours. Cardiac Enzymes: No results for input(s): CKTOTAL, CKMB, CKMBINDEX, TROPONINI in the last 168 hours. BNP (last 3 results) No results for input(s): PROBNP in the last 8760 hours. HbA1C: No results for input(s): HGBA1C in the last 72 hours. CBG: No results for input(s): GLUCAP in the last 168 hours. Lipid Profile: No results for input(s): CHOL, HDL, LDLCALC, TRIG, CHOLHDL, LDLDIRECT in the last 72 hours. Thyroid Function Tests: No results for input(s): TSH, T4TOTAL, FREET4, T3FREE, THYROIDAB in the last 72 hours. Anemia Panel: No results  for input(s): VITAMINB12, FOLATE, FERRITIN, TIBC, IRON, RETICCTPCT in the last 72 hours. Urine analysis:    Component Value Date/Time   COLORURINE YELLOW 06/20/2020 [place] 06/20/2020 0927   LABSPEC 1.012 06/20/2020 0927   PHURINE 5.0 06/20/2020 0927   GLUCOSEU NEGATIVE 06/20/2020 0927   HGBUR SMALL (A) 06/20/2020 0927   BILIRUBINUR NEGATIVE 06/20/2020 0927   KETONESUR NEGATIVE 06/20/2020 0927   PROTEINUR 30 (A) 06/20/2020 0927   NITRITE NEGATIVE 06/20/2020 0927   LEUKOCYTESUR NEGATIVE 06/20/2020 0927   Sepsis Labs: '@LABRCNTIP'$ (procalcitonin:4,lacticidven:4) )No results found for this or any previous visit (from the past 240 hour(s)).   Radiological Exams on Admission: DG Foot 2 Views Right  Result Date: 08/08/2020 CLINICAL DATA:  Redness and drainage right foot EXAM: RIGHT FOOT - 2 VIEW COMPARISON:  02/22/2020 FINDINGS: Bones appear osteopenic. Old fracture deformity base of fifth metatarsal. Apparent partial amputation of third digit with residual proximal phalanx. Slight erosive change at the cut end of the proximal phalanx. Suspected bony resorption of the second distal phalanx. Possible erosions at the base of the second and third proximal phalanges and head of third metatarsal. Vascular calcifications. 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 IMPRESSION: 1. Apparent interval partial amputation third digit with remnant proximal phalanx. Findings suspicious for osteomyelitis involving the distal phalanx of the second digit and the cut end of the residual third proximal phalanx. There may be additional erosive change involving the bases of the second and third proximal phalanges and head of third metatarsal. 2. 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 Electronically Signed   By: Donavan Foil M.D.   On: 08/08/2020 21:01     EKG: Independently reviewed. Sinus rhythm.   Assessment/Plan   1. Right foot gangrene, osteomyelitis  - Presents with progressive discoloration and drainage from right foot despite Keflex and is noted to have gangrenous right forefoot with osteomyelitis on plain radiographs  - She is not septic on admission  - Antibiotics were started in ED and vascular surgery was  consulted  - Continue antibiotics with vancomycin, Rocephin, and Flagyl while following cultures, inflammatory markers, and clinical course    2. PAD  - Appreciate vascular surgery consultation, planning for arteriogram 4/14  - Continue statin, ASA, and Plavix    3. ESRD - Reports completing HD on 4/11 without incident  - No indication for urgent HD on admission  - Renally-dose medications, restrict fluids, consult nephrology for maintenance tomorrow    4. IDDM  - A1c was 5.8% in February 2022  - Continue CBG checks and insulin    5. Anemia, thrombocytosis  - Hgb appears stable at 9.2 on admission without overt bleeding  - Platelets 894k on admission, likely reactive to #1    6. Hypertension  - Continue Norvasc and Toprol   7. Chronic pain  - No pain complaints on admission      DVT prophylaxis: sq heparin  Code Status: Full  Level of Care: Level of care: Med-Surg Family Communication: None present  Disposition Plan:  Patient is from: Home  Anticipated d/c is to: TBD Anticipated d/c date is: Possibly as early as 08/11/20 Patient currently: pending arteriogram  Consults called: Vascular surgery  Admission status: Inpatient     Vianne Bulls, MD [facility]  08/09/2020, 1:03 AM

## 2020-08-10 ENCOUNTER — Ambulatory Visit (HOSPITAL_COMMUNITY): Admission: RE | Admit: 2020-08-10 | Payer: Medicare Other | Source: Home / Self Care | Admitting: Vascular Surgery

## 2020-08-10 ENCOUNTER — Encounter (HOSPITAL_COMMUNITY)
Admission: EM | Disposition: A | Discharge status: Home Health | Payer: Self-pay | Source: Home / Self Care | Attending: Internal Medicine

## 2020-08-10 ENCOUNTER — Other Ambulatory Visit: Payer: Self-pay

## 2020-08-10 ENCOUNTER — Encounter (HOSPITAL_COMMUNITY): Payer: Self-pay | Admitting: Vascular Surgery

## 2020-08-10 DIAGNOSIS — M86471 Chronic osteomyelitis with draining sinus, right ankle and foot: Secondary | ICD-10-CM | POA: Diagnosis not present

## 2020-08-10 HISTORY — PX: ABDOMINAL AORTOGRAM W/LOWER EXTREMITY: CATH118223

## 2020-08-10 HISTORY — PX: PERIPHERAL VASCULAR INTERVENTION: CATH118257

## 2020-08-10 LAB — CBC
HCT: 29.7 % — ABNORMAL LOW (ref 36.0–46.0)
Hemoglobin: 8.9 g/dL — ABNORMAL LOW (ref 12.0–15.0)
MCH: 24.7 pg — ABNORMAL LOW (ref 26.0–34.0)
MCHC: 30 g/dL (ref 30.0–36.0)
MCV: 82.5 fL (ref 80.0–100.0)
Platelets: 762 10*3/uL — ABNORMAL HIGH (ref 150–400)
RBC: 3.6 MIL/uL — ABNORMAL LOW (ref 3.87–5.11)
RDW: 18.1 % — ABNORMAL HIGH (ref 11.5–15.5)
WBC: 16.5 10*3/uL — ABNORMAL HIGH (ref 4.0–10.5)
nRBC: 0 % (ref 0.0–0.2)

## 2020-08-10 LAB — BASIC METABOLIC PANEL
Anion gap: 10 (ref 5–15)
BUN: 15 mg/dL (ref 8–23)
CO2: 28 mmol/L (ref 22–32)
Calcium: 8.7 mg/dL — ABNORMAL LOW (ref 8.9–10.3)
Chloride: 97 mmol/L — ABNORMAL LOW (ref 98–111)
Creatinine, Ser: 5.23 mg/dL — ABNORMAL HIGH (ref 0.44–1.00)
GFR, Estimated: 9 mL/min — ABNORMAL LOW (ref >60)
Glucose, Bld: 114 mg/dL — ABNORMAL HIGH (ref 70–99)
Potassium: 3.8 mmol/L (ref 3.5–5.1)
Sodium: 135 mmol/L (ref 135–145)

## 2020-08-10 LAB — GLUCOSE, CAPILLARY
Glucose-Capillary: 83 mg/dL (ref 70–99)
Glucose-Capillary: 118 mg/dL — ABNORMAL HIGH (ref 70–99)
Glucose-Capillary: 100 mg/dL — ABNORMAL HIGH (ref 70–99)
Glucose-Capillary: 110 mg/dL — ABNORMAL HIGH (ref 70–99)
Glucose-Capillary: 174 mg/dL — ABNORMAL HIGH (ref 70–99)

## 2020-08-10 LAB — C-REACTIVE PROTEIN: CRP: 19.6 mg/dL — ABNORMAL HIGH (ref <1.0)

## 2020-08-10 LAB — SEDIMENTATION RATE: Sed Rate: 82 mm/hr — ABNORMAL HIGH (ref 0–22)

## 2020-08-10 LAB — PROTIME-INR
INR: 1.2 (ref 0.8–1.2)
Prothrombin Time: 15.4 seconds — ABNORMAL HIGH (ref 11.4–15.2)

## 2020-08-10 SURGERY — ABDOMINAL AORTOGRAM W/LOWER EXTREMITY
Anesthesia: LOCAL | Laterality: Right

## 2020-08-10 MED ORDER — HEPARIN (PORCINE) IN NACL 1000-0.9 UT/500ML-% IV SOLN
INTRAVENOUS | Status: DC | PRN
  Administered 2020-08-10 (×2): 500 mL

## 2020-08-10 MED ORDER — LABETALOL HCL 5 MG/ML IV SOLN: 10.0000 mg | INTRAVENOUS | Status: DC | PRN

## 2020-08-10 MED ORDER — SODIUM CHLORIDE 0.9% FLUSH
3.0000 mL | Freq: Two times a day (BID) | INTRAVENOUS | Status: DC
  Administered 2020-08-10 – 2020-08-18 (×17): 3 mL via INTRAVENOUS

## 2020-08-10 MED ORDER — HYDRALAZINE HCL 20 MG/ML IJ SOLN: 5.0000 mg | INTRAMUSCULAR | Status: DC | PRN

## 2020-08-10 MED ORDER — VANCOMYCIN HCL IN DEXTROSE 1-5 GM/200ML-% IV SOLN
1000.0000 mg | INTRAVENOUS | Status: AC
  Filled 2020-08-10: qty 200

## 2020-08-10 MED ORDER — IODIXANOL 320 MG/ML IV SOLN
INTRAVENOUS | Status: DC | PRN
  Administered 2020-08-10: 110 mL via INTRA_ARTERIAL

## 2020-08-10 MED ORDER — ONDANSETRON HCL 4 MG/2ML IJ SOLN: 4.0000 mg | Freq: Four times a day (QID) | INTRAMUSCULAR | Status: DC | PRN

## 2020-08-10 MED ORDER — SODIUM CHLORIDE 0.9 % IV SOLN: 250.0000 mL | INTRAVENOUS | Status: DC | PRN

## 2020-08-10 MED ORDER — FENTANYL CITRATE (PF) 100 MCG/2ML IJ SOLN
INTRAMUSCULAR | Status: DC | PRN
  Administered 2020-08-10: 50 ug via INTRAVENOUS

## 2020-08-10 MED ORDER — SODIUM CHLORIDE 0.9% FLUSH: 3.0000 mL | INTRAVENOUS | Status: DC | PRN

## 2020-08-10 MED ORDER — HEPARIN SODIUM (PORCINE) 1000 UNIT/ML IJ SOLN
INTRAMUSCULAR | Status: DC | PRN
  Administered 2020-08-10: 9000 [IU] via INTRAVENOUS

## 2020-08-10 MED ORDER — ACETAMINOPHEN 325 MG PO TABS: 650.0000 mg | ORAL_TABLET | ORAL | Status: DC | PRN

## 2020-08-10 MED ORDER — MIDAZOLAM HCL 2 MG/2ML IJ SOLN
INTRAMUSCULAR | Status: DC | PRN
  Administered 2020-08-10: 1 mg via INTRAVENOUS

## 2020-08-10 MED ORDER — VANCOMYCIN HCL IN DEXTROSE 1-5 GM/200ML-% IV SOLN: 1000.0000 mg | INTRAVENOUS | Status: DC

## 2020-08-10 MED ORDER — LIDOCAINE HCL (PF) 1 % IJ SOLN
INTRAMUSCULAR | Status: DC | PRN
  Administered 2020-08-10: 15 mL via INTRADERMAL

## 2020-08-10 SURGICAL SUPPLY — 18 items
BALLN STERLING OTW 3X220X150 (BALLOONS) ×3
BALLOON STERLING OTW 3X220X150 (BALLOONS) ×2 IMPLANT
CATH CXI SUPP 2.6F 150 ANG (CATHETERS) ×3 IMPLANT
CATH OMNI FLUSH 5F 65CM (CATHETERS) ×3 IMPLANT
DEVICE CLOSURE MYNXGRIP 5F (Vascular Products) ×3 IMPLANT
KIT ENCORE 26 ADVANTAGE (KITS) ×3 IMPLANT
KIT MICROPUNCTURE NIT STIFF (SHEATH) ×3 IMPLANT
KIT PV (KITS) ×3 IMPLANT
MAT PREVALON FULL STRYKER (MISCELLANEOUS) ×3 IMPLANT
SHEATH FLEX ANSEL ANG 5F 45CM (SHEATH) ×3 IMPLANT
SHEATH PINNACLE 5F 10CM (SHEATH) ×3 IMPLANT
SHEATH PROBE COVER 6X72 (BAG) ×3 IMPLANT
SYR MEDRAD MARK 7 150ML (SYRINGE) ×3 IMPLANT
TRANSDUCER W/STOPCOCK (MISCELLANEOUS) ×3 IMPLANT
TRAY PV CATH (CUSTOM PROCEDURE TRAY) ×3 IMPLANT
WIRE BENTSON .035X145CM (WIRE) ×3 IMPLANT
WIRE G V18X300CM (WIRE) ×3 IMPLANT
WIRE ROSEN-J .035X260CM (WIRE) ×3 IMPLANT

## 2020-08-10 NOTE — Progress Notes (Signed)
[facility]  Subjective  - extensive right foot wounds.   Objective 140/61 75 98.7 F (37.1 C) (Oral) 18 99%  Intake/Output Summary (Last 24 hours) at 08/10/2020 0723 Last data filed at 08/09/2020 1623 Gross per 24 hour  Intake --  Output 2000 ml  Net -2000 ml        Laboratory Lab Results: Recent Labs    08/09/20 0348 08/10/20 0355  WBC 16.9* 16.5*  HGB 8.6* 8.9*  HCT 29.1* 29.7*  PLT 782* 762*   BMET Recent Labs    08/09/20 0348 08/10/20 0355  NA 135 135  K 4.3 3.8  CL 98 97*  CO2 25 28  GLUCOSE 93 114*  BUN 22 15  CREATININE 8.25* 5.23*  CALCIUM 8.7* 8.7*    COAG Lab Results  Component Value Date   INR 1.2 08/10/2020   INR 1.2 06/08/2020   No results found for: PTT  Assessment/Planning:  Right lower extremity arteriogram with possible intervention.  Risks and benefits discussed.  [name] 08/10/2020 7:23 AM --

## 2020-08-10 NOTE — Plan of Care (Signed)

## 2020-08-10 NOTE — Anesthesia Preprocedure Evaluation (Addendum)
Anesthesia Evaluation  Patient identified by MRN, date of birth, ID band  Reviewed: Allergy & Precautions, Patient's Chart, lab work & pertinent test results, reviewed documented beta blocker date and time   History of Anesthesia Complications Negative for: history of anesthetic complications  Airway Mallampati: II  TM Distance: >3 FB     Dental  (+) Edentulous Upper, Poor Dentition, Missing, Dental Advisory Given   Pulmonary asthma , COPD, former smoker,  05/02/2020 COVID POSITIVE   breath sounds clear to auscultation + decreased breath sounds      Cardiovascular hypertension, Pt. on medications and Pt. on home beta blockers (-) angina+ Peripheral Vascular Disease and +CHF   Rhythm:Regular Rate:Normal     Neuro/Psych Diabetic peripheral neuropathy  Neuromuscular disease    GI/Hepatic negative GI ROS, Neg liver ROS,   Endo/Other  diabetes, Insulin DependentMorbid obesity  Renal/GU Dialysis and ESRFRenal disease (MWF)     Musculoskeletal  (+) Arthritis ,   Abdominal (+) + obese,   Peds  Hematology  (+) anemia , Plavix   Anesthesia Other Findings   Reproductive/Obstetrics                            Anesthesia Physical  Anesthesia Plan  ASA: III  Anesthesia Plan: Regional   Post-op Pain Management:    Induction: Intravenous  PONV Risk Score and Plan: 3 and Ondansetron, Midazolam, Treatment may vary due to age or medical condition and Propofol infusion  Airway Management Planned: Natural Airway  Additional Equipment: None  Intra-op Plan:   Post-operative Plan:   Informed Consent: I have reviewed the patients History and Physical, chart, labs and discussed the procedure including the risks, benefits and alternatives for the proposed anesthesia with the patient or authorized representative who has indicated his/her understanding and acceptance.     Dental advisory given  Plan  Discussed with: CRNA  Anesthesia Plan Comments: (PAT note written 06/06/2020 by Myra Gianotti, PA-C.)       Anesthesia Quick Evaluation                                  Anesthesia Evaluation  Patient identified by MRN, date of birth, ID band Patient awake    Reviewed: Allergy & Precautions, NPO status , Patient's Chart, lab work & pertinent test results, reviewed documented beta blocker date and time   History of Anesthesia Complications Negative for: history of anesthetic complications  Airway Mallampati: I  TM Distance: >3 FB Neck ROM: Full    Dental  (+) Edentulous Upper, Poor Dentition, Missing, Dental Advisory Given   Pulmonary COPD, former smoker,  05/02/2020 COVID POSITIVE   breath sounds clear to auscultation       Cardiovascular hypertension, Pt. on medications and Pt. on home beta blockers (-) angina+ Peripheral Vascular Disease   Rhythm:Regular Rate:Normal     Neuro/Psych Diabetic peripheral neuropathy    GI/Hepatic negative GI ROS, Neg liver ROS,   Endo/Other  diabetes (glu 90), Insulin DependentMorbid obesity  Renal/GU Dialysis and ESRFRenal disease (MWF)     Musculoskeletal  (+) Arthritis ,   Abdominal (+) + obese,   Peds  Hematology Plavix   Anesthesia Other Findings   Reproductive/Obstetrics                           Anesthesia Physical Anesthesia Plan  ASA: III  Anesthesia Plan: General   Post-op Pain Management:    Induction: Intravenous  PONV Risk Score and Plan: 3 and Ondansetron, Dexamethasone and Scopolamine patch - Pre-op  Airway Management Planned: LMA  Additional Equipment: None  Intra-op Plan:   Post-operative Plan:   Informed Consent: I have reviewed the patients History and Physical, chart, labs and discussed the procedure including the risks, benefits and alternatives for the proposed anesthesia with the patient or authorized representative who has indicated his/her  understanding and acceptance.     Dental advisory given  Plan Discussed with: CRNA and Surgeon  Anesthesia Plan Comments: (PAT note written 06/06/2020 by Myra Gianotti, PA-C.)      Anesthesia Quick Evaluation

## 2020-08-10 NOTE — Progress Notes (Signed)
Pharmacy Antibiotic Note  Christy Zhang is a 65 y.o. female admitted on 08/08/2020 with wound infection.  Pharmacy has been consulted for cefepime dosing.  Presenting with infection of L foot. She is s/p RLE arteriogram/angioplasty with plans for TMA on 4/15. 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    Allergies  Allergen Reactions  . Sulfa Antibiotics Swelling    Antimicrobials this admission: Vanco 4/12>> Cefepime 4/12 x 1 Flagyl 4/12>> CTX 4/13>>  Dose adjustments this admission: N/A  Microbiology results: 4/12 BCx: ngtd  Thank you for allowing pharmacy to be a part of this patient's care.  Hildred Laser, PharmD Clinical Pharmacist **Pharmacist phone directory can now be found on [email or web address] ([email or web address]).  Listed under [place].

## 2020-08-10 NOTE — Progress Notes (Signed)
Pt arrived to 4e from cath lab. Pt oriented to room and staff. Vitals obtained. CHG bath done. Bedrest until 11 am. Pt educated.

## 2020-08-10 NOTE — Consult Note (Signed)
   [facility] Inpatient Consult   08/10/2020  Christy Zhang Mar 30, 1956 [id number]   Patient is currently active with [facility] for chronic disease management services.  Patient has been engaged by a [place] community embedded Consulting civil engineer for chronic case management services and care coordination at primary MD office.  Plan: Will continue to follow for progression and disposition plans and will update outpatient RNCM at [facility] of progression.  Of note, [facility] Care Management services does not replace or interfere with any services that are arranged by inpatient case management or social work.  Netta Cedars, MSN, [facility] Liaison Nurse Mobile Phone [phone]  Toll free office [phone]

## 2020-08-10 NOTE — Op Note (Addendum)
Patient name: Christy Zhang MRN: [id number] DOB: June 13, 1955 Sex: female  08/10/2020 Pre-operative Diagnosis: Critical limb ischemia of the right lower extremity with tissue loss Post-operative diagnosis:  Same Surgeon:  [name], MD Procedure Performed: 1.  Ultrasound-guided access of left common femoral artery 2.  Aortogram including catheter selection of aorta 3.  Right lower extremity arteriogram with selection of third order branches 4.  Right anterior tibial artery angioplasty (3 mm x 220 mm Sterling) 5.  Mynx closure of the left common femoral artery 6.  44 minutes of monitored moderate conscious sedation time  Indications: Patient is a 65 year old female with multiple medical comorbidities including diabetes and end-stage renal disease who presents for right lower extremity arteriogram and intervention given history of PAD with known tibial disease and right foot tissue loss.  She presents today after risk benefits discussed.  Findings:   Aortogram showed approximate 50% stenosis of right renal artery while the left renal artery appears widely patent (she is ESRD).  Otherwise the aortoiliac segment was widely patent with no flow limiting stenosis.  Right lower extremity arteriogram showed a patent common femoral, profunda, SFA, above and below-knee popliteal artery.  Patient has severe tibial disease.  She has single-vessel runoff via an anterior tibial that had recurrent high-grade stenosis with multiple focal lesions 50 to 70% stenosis throughout the vessel with flow into the dorsalis pedis in the foot.  Peroneal and posterior tibial arteries appeared occluded.  Ultimately the right anterior tibial artery was crossed into the foot and the entire anterior tibial artery was angioplastied with a 3 mm Sterling to nominal pressure for 2 minutes.  Excellent results.  No residual stenosis.  Preserved runoff down the anterior tibial with filling of the dorsalis  pedis.   Procedure:  The patient was identified in the holding area and taken to room 8.  The patient was then placed supine on the table and prepped and draped in the usual sterile fashion.  A time out was called.  Ultrasound was used to evaluate the left common femoral artery.  It was patent .  A digital ultrasound image was acquired.  A micropuncture needle was used to access the left common femoral artery under ultrasound guidance.  An 018 wire was advanced without resistance and a micropuncture sheath was placed.  The 018 wire was removed and a benson wire was placed.  The micropuncture sheath was exchanged for a 5 french sheath.  An omniflush catheter was advanced over the wire to the level of L-1.  An abdominal angiogram was obtained.  Next, using the omniflush catheter and a benson wire, the aortic bifurcation was crossed and the catheter was placed into theright external iliac artery and right runoff was obtained.  After evaluating images elected to intervene on her right anterior tibial artery where she had recurrent disease.  Ultimately used a Rosen wire down the right SFA and exchanged for a 5 [place] Ansell sheath in the left groin over the aortic bifurcation.  Patient was given 100 units/kg herparinIV.  I then used a CXI catheter with a V 18 and I was able to cross the SFA popliteal into the anterior tibial and got my wire into the foot where I confirmed on hand-injection that I was in the true lumen.  I then used a 3 mm x 220 mm Sterling and the entire anterior tibial was angioplastied to nominal pressure for 2 minutes.  No residual disease with good runoff into the foot.  That point time wires and catheters were removed exchanged for a short 5 French sheath in the left groin.  A mynx closure device was deployed.   Plan: Patient will be posted for right TMA tomorrow with Dr. Donzetta Matters.  This will be very high risk given the right foot plantar surface soft tissue is marginal.  She is now optimized from  a vascular surgery standpoint.  [name], MD [facility] Office: [phone]

## 2020-08-10 NOTE — Progress Notes (Signed)
PROGRESS NOTE    Christy Zhang  [name] DOB: [birth year] DOA: 08/08/2020 PCP: Claretta Fraise, MD   Chief Complaint  Patient presents with  . Foot Pain  Brief Narrative: 65 year old female with significant complex medical history of ESRD on HD insulin-dependent diabetes, hypertension, chronic pain, peripheral arterial disease, right foot infection presents to the ED with progressive discoloration and dryness of the right foot.    As per to report  patient " has been following with vascular surgery, was scheduled for arteriogram on 08/10/2020, and had just completed a course of Keflex, but continued to have progressive discoloration of the right forefoot with increasing drainage from the third toe, prompting her presentation to the ED.  She denies any fevers, chills, or pain.  She reports completing dialysis without incident yesterday.  She denies any chest pain, cough, or shortness of breath. Upon arrival to the ED, patient is found to be afebrile, saturating well on room air, and with blood pressure 175/63.  EKG features a sinus rhythm.  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  Blood culture was collected in the ED, cefepime and vancomycin were administered, vascular surgery was consulted by the ED physician, and hospitalists asked to admit"  Patient is continued on broad-spectrum IV antibiotics.  4/14 underwent aortogram right lower extremity arteriogram status post right anterior tibial artery angioplasty, found to have critical limb ischemia of the right lower extremity with tissue loss, planning for right  TMA  Subjective:  Seen and examined this morning.  Pain is controlled no new complaints.  Spoke with daughter on the phone. Underwent right lower extremity arteriogram this morning with angioplasty  Assessment & Plan:  Critical right lower extremity ischemia with tissue loss Gangrenous right mid foot with extensive wound Is status post right lower extremity arteriogram-s/p right anterior tibial artery angioplasty.  Plan is for Rt TMA by Dr. Donzetta Matters 4/15.  Blood culture no growth so far.  Still with persistent leukocytosis slightly better.  Continue wound care, broad-spectrum antibiotics with:Vancomycin Flagyl and ceftriaxone.CRP is elevated. Recent Labs  Lab 08/08/20 1855 08/09/20 0348 08/10/20 0355  WBC 18.3* 16.9* 16.5*  LATICACIDVEN 1.4  --   --    lft BKA recently- cont ace wrap, nursign dressing.  ESRD and HD [id number] following closely, plan is to hold HD tomorrow and dialyze Saturday PSOT or.  Secondary hyperparathyroidism monitor calcium and phosphorus.  Continue Renvela.    PVD: See #1.  Continue aspirin Plavix and statin.   Diabetes mellitus insulin-dependent with ESRD with long-term insulin, poorly controlled.  Hemoglobin A1c 8.8 uncontrolled.  Sugars are stable, continue Lantus 10 units, SSI Recent Labs  Lab 08/09/20 1138 08/09/20 1729 08/09/20 2301 08/10/20 0709 08/10/20 0947  GLUCAP 118* 114* 115* 83 118*   Essential (primary) hypertension: Well-controlled on amlodipine, metoprolol.  Monitor.  Anemia of chronic renal disease.  Monitor hemoglobin. Recent Labs  Lab 08/08/20 1855 08/09/20 0348 08/10/20 0355  HGB 9.2* 8.6* 8.9*  HCT 31.3* 29.1* 29.7*   Thrombocytosis: Likely from osteomyelitis.  Monitor  Morbid obesity with BMI 38.  Will benefit with weight loss and PCP follow-up.  Diet Order  Diet NPO time specified  Diet effective midnight           Diet regular Room service appropriate? Yes; Fluid consistency: Thin  Diet effective now                 Patient's Body mass index is 37.8 kg/m. DVT prophylaxis: heparin injection 5,000 Units Start: 08/09/20 1400 Code Status:   Code Status: Full Code  Family Communication: plan of care discussed with patient at bedside.  Daughter updated over the phone, she has questions for Dr. Carlis Abbott.  Status is: Inpatient Remains inpatient appropriate because:IV treatments appropriate due to intensity of illness or inability to take PO and Inpatient level of care appropriate due to severity of illness remains hospitalized for ongoing management of gangrenous right foot.  Dispo: The patient is from: Home              Anticipated d/c is to: TBD              Patient currently is not medically stable to d/c.   Difficult to place patient No   Unresulted Labs (From admission, onward)          Start     Ordered   08/09/20 0500  Sedimentation rate  Daily,   R      08/09/20 0054   08/09/20 0500  C-reactive protein  Daily,   R      08/09/20 0054   08/09/20 0500  CBC  Daily,   R      08/09/20 0054   08/09/20 [id number]  Basic metabolic panel  Daily,   R      08/09/20 0054   08/08/20 1839  Blood culture (routine x 2)  BLOOD CULTURE X 2,   STAT      08/08/20 1839        Medications reviewed:  Scheduled Meds: . (feeding supplement) PROSource Plus  30 mL Oral BID BM  . amLODipine  5 mg Oral QHS  . aspirin EC  81 mg Oral QHS  . atorvastatin  40 mg Oral Daily  . Chlorhexidine Gluconate Cloth  6 each Topical Q0600  . clopidogrel  75 mg Oral QPM  . doxercalciferol  1.5 mcg Intravenous Q M,W,F-HD  . feeding supplement (NEPRO CARB STEADY)  237 mL Oral Q24H  . gabapentin  300 mg Oral QHS  . heparin  5,000 Units Subcutaneous Q8H  . insulin aspart  0-6 Units Subcutaneous TID WC  . insulin glargine  10 Units Subcutaneous Daily  . metoprolol succinate  50 mg Oral Daily  . metroNIDAZOLE  500 mg Oral Q8H  . multivitamin  1 tablet Oral QHS  . polyethylene glycol  17 g Oral Daily  . senna-docusate  1  tablet Oral BID  . sevelamer carbonate  2,400 mg Oral TID WC  . sodium chloride flush  3 mL Intravenous Q12H   Continuous Infusions: . sodium chloride    . cefTRIAXone (ROCEPHIN)  IV 2 g (08/09/20 2252)  . vancomycin 1,000 mg (08/09/20 1522)    Consultants:see note  Procedures:see note  Antimicrobials: Anti-infectives (From admission, onward)   Start     Dose/Rate Route Frequency Ordered Stop   08/09/20 2200  cefTRIAXone (ROCEPHIN) 2 g in sodium chloride 0.9 % 100 mL IVPB        2 g 200 mL/hr over 30 Minutes Intravenous Every 24 hours 08/09/20 0054     08/09/20 1200  vancomycin (VANCOREADY) IVPB 1000 mg/200 mL  Status:  Discontinued        1,000 mg 200 mL/hr over 60 Minutes Intravenous Every M-W-F (Hemodialysis) 08/08/20 2217 08/09/20 0812   08/09/20 1200  ceFEPIme (MAXIPIME) 2 g in sodium chloride 0.9 % 100 mL IVPB  Status:  Discontinued        2 g 200 mL/hr over 30 Minutes Intravenous Every M-W-F (Hemodialysis) 08/08/20 2217 08/09/20 0054   08/09/20 1200  vancomycin (VANCOCIN) IVPB 1000 mg/200 mL premix        1,000 mg 200 mL/hr over 60 Minutes Intravenous Every M-W-F (Hemodialysis) 08/09/20 0812     08/09/20 0100  metroNIDAZOLE (FLAGYL) tablet 500 mg        500 mg Oral Every 8 hours 08/09/20 0054     08/08/20 2200  vancomycin (VANCOREADY) IVPB 2000 mg/400 mL        2,000 mg 200 mL/hr over 120 Minutes Intravenous  Once 08/08/20 2145 08/09/20 0143   08/08/20 2200  ceFEPIme (MAXIPIME) 2 g in sodium chloride 0.9 % 100 mL IVPB        2 g 200 mL/hr over 30 Minutes Intravenous  Once 08/08/20 2145 08/08/20 2333   08/08/20 2145  vancomycin (VANCOREADY) IVPB 1000 mg/200 mL  Status:  Discontinued        1,000 mg 200 mL/hr over 60 Minutes Intravenous  Once 08/08/20 2136 08/08/20 2145     Culture/Microbiology    Component Value Date/Time   SDES BLOOD SITE NOT SPECIFIED 08/08/2020 2044   SPECREQUEST  08/08/2020 2044    BOTTLES DRAWN AEROBIC AND ANAEROBIC Blood Culture results may  not be optimal due to an inadequate volume of blood received in culture bottles   CULT  08/08/2020 2044    NO GROWTH 2 DAYS Performed at [facility], [place] [address], [place], [place] [address]    REPTSTATUS PENDING 08/08/2020 2044    Other culture-see note  Objective: Vitals: Today's Vitals   08/10/20 0835 08/10/20 0846 08/10/20 0926 08/10/20 1000  BP:   (!) 156/27 (!) 134/49  Pulse:   73 76  Resp:   19 17  Temp:   98.3 F (36.8 C)   TempSrc:   Oral   SpO2:   98% 100%  Weight:      Height:      PainSc: 0-No pain 0-No pain 0-No pain     Intake/Output Summary (Last 24 hours) at 08/10/2020 1057 Last data filed at 08/09/2020 1623 Gross per 24 hour  Intake --  Output 2000 ml  Net -2000 ml   Filed Weights   08/08/20 1842 08/09/20 1210 08/09/20 1623  Weight: 97.5 kg 98.8 kg 96.8 kg   Weight change: 1.277 kg  Intake/Output from previous day: 04/13 0701 - 04/14 0700 In: -  Out: 2000  Intake/Output this shift: No intake/output data recorded. Filed Weights   08/08/20 1842 08/09/20 1210 08/09/20 1623  Weight: 97.5 kg 98.8 kg 96.8 kg    Examination: General exam: AAOx3, obese,NAD, weak appearing. HEENT:Oral mucosa moist, Ear/Nose WNL grossly, dentition normal. Respiratory system: bilaterally diminishedd,no wheezing or crackles,no use of accessory muscle Cardiovascular system: S1 & S2 +, No JVD,. Gastrointestinal system: Abdomen soft, NT,ND, BS+ Nervous System:Alert, awake, moving extremities and grossly nonfocal Extremities: No edema, distal peripheral pulses palpable.  Left BKA, right foot with dressing in place see picture Skin: No rashes,no icterus. MSK: Normal muscle bulk,tone, power   Data Reviewed: I have personally reviewed following labs and imaging studies CBC: Recent Labs  Lab 08/08/20 1855 08/09/20  [id number] 08/10/20 0355  WBC 18.3* 16.9* 16.5*  NEUTROABS 14.3*  --   --   HGB 9.2* 8.6* 8.9*  HCT 31.3* 29.1* 29.7*  MCV 84.6 82.9 82.5  PLT 894* 782*  [id number]*   Basic Metabolic Panel: Recent Labs  Lab 08/08/20 1855 08/09/20 0348 08/10/20 0355  NA 136 135 135  K 4.7 4.3 3.8  CL 96* 98 97*  CO2 '26 25 28  '$ GLUCOSE 166* 93 114*  BUN '20 22 15  '$ CREATININE 7.67* 8.25* 5.23*  CALCIUM 8.9 8.7* 8.7*  MG 1.9  --   --    GFR: Estimated Creatinine Clearance: 12.2 mL/min (A) (by C-G formula based on SCr of 5.23 mg/dL (H)). Liver Function Tests: Recent Labs  Lab 08/08/20 1855  AST 10*  ALT 7  ALKPHOS 63  BILITOT 0.6  PROT 8.0  ALBUMIN 2.6*   No results for input(s): LIPASE, AMYLASE in the last 168 hours. No results for input(s): AMMONIA in the last 168 hours. Coagulation Profile: Recent Labs  Lab 08/10/20 0355  INR 1.2   Cardiac Enzymes: No results for input(s): CKTOTAL, CKMB, CKMBINDEX, TROPONINI in the last 168 hours. BNP (last 3 results) No results for input(s): PROBNP in the last 8760 hours. HbA1C: Recent Labs    08/09/20 0348 08/09/20 0523  HGBA1C 8.8* 6.5*   CBG: Recent Labs  Lab 08/09/20 1138 08/09/20 1729 08/09/20 2301 08/10/20 0709 08/10/20 0947  GLUCAP 118* 114* 115* 83 118*   Lipid Profile: No results for input(s): CHOL, HDL, LDLCALC, TRIG, CHOLHDL, LDLDIRECT in the last 72 hours. Thyroid Function Tests: No results for input(s): TSH, T4TOTAL, FREET4, T3FREE, THYROIDAB in the last 72 hours. Anemia Panel: No results for input(s): VITAMINB12, FOLATE, FERRITIN, TIBC, IRON, RETICCTPCT in the last 72 hours. Sepsis Labs: Recent Labs  Lab 08/08/20 1855  LATICACIDVEN 1.4    Recent Results (from the past 240 hour(s))  Blood culture (routine x 2)     Status: None (Preliminary result)   Collection Time: 08/08/20  8:44 PM   Specimen: BLOOD  Result Value Ref Range Status   Specimen Description BLOOD SITE NOT SPECIFIED  Final   Special Requests   Final    BOTTLES DRAWN AEROBIC AND ANAEROBIC Blood Culture results may not be optimal due to an inadequate volume of blood received in culture bottles   Culture    Final    NO GROWTH 2 DAYS Performed at [facility], [place] [address], [place], [place] [address]    Report Status PENDING  Incomplete  SARS CORONAVIRUS 2 (TAT 6-24 HRS) Nasopharyngeal Nasopharyngeal Swab     Status: None   Collection Time: 08/08/20 10:31 PM   Specimen: Nasopharyngeal Swab  Result Value Ref Range Status   SARS Coronavirus 2 NEGATIVE NEGATIVE Final    Comment: (NOTE) SARS-CoV-2 target nucleic acids are NOT DETECTED.  The SARS-CoV-2 RNA is generally detectable in upper and lower respiratory specimens during the acute phase of infection. Negative results do not preclude SARS-CoV-2 infection, do not rule out co-infections with other pathogens, and should not be used as the sole basis for treatment or other patient management decisions. Negative results must be combined with clinical observations, patient history, and epidemiological information. The expected result is Negative.  Fact Sheet for Patients: SugarRoll.be  Fact Sheet for Healthcare Providers: https://www.woods-mathews.com/  This test is not yet approved or cleared by the [place] FDA and  has been authorized for detection and/or diagnosis of SARS-CoV-2 by FDA under an Emergency  Use Authorization (EUA). This EUA will remain  in effect (meaning this test can be used) for the duration of the COVID-19 declaration under Se ction 564(b)(1) of the Act, 21 U.S.C. section 360bbb-3(b)(1), unless the authorization is terminated or revoked sooner.  Performed at [facility], [place] [address] [place], [place] [address]      Radiology Studies: PERIPHERAL VASCULAR CATHETERIZATION  Result Date: 08/10/2020 Patient name: VIVIAN SWARTZENTRUBER         MRN: [id number]        DOB: 02-11-1956          Sex: female  08/10/2020 Pre-operative Diagnosis: Critical limb ischemia of the right lower extremity with tissue loss Post-operative diagnosis:  Same Surgeon:  [name], MD Procedure Performed: 1.  Ultrasound-guided access of left common femoral artery 2.  Aortogram including catheter selection of aorta 3.  Right lower extremity arteriogram with selection of third order branches 4.  Right anterior tibial artery angioplasty (3 mm x 220 mm Sterling) 5.  Mynx closure of the left common femoral artery 6.  44 minutes of monitored moderate conscious sedation time  Indications: Patient is a 65 year old female with multiple medical comorbidities including diabetes and end-stage renal disease who presents for right lower extremity arteriogram and intervention given history of PAD with known tibial disease and right foot tissue loss.  She presents today after risk benefits discussed.  Findings:  Aortogram showed approximate 50% stenosis of right renal artery while the left renal artery appears widely patent (she is ESRD).  Otherwise the aortoiliac segment was widely patent with no flow limiting stenosis.  Right lower extremity arteriogram showed a patent common femoral, profunda, SFA, above and below-knee popliteal artery.  Patient has severe tibial disease.  She has single-vessel runoff via an anterior tibial that had recurrent high-grade stenosis with multiple focal lesions 50 to 70% stenosis throughout the vessel with flow into the dorsalis pedis in the foot.  Peroneal and posterior tibial arteries appeared occluded.  Ultimately the right anterior tibial artery was crossed into the foot and the entire anterior tibial artery was angioplastied with a 3 mm Sterling to nominal pressure for 2 minutes.  Excellent results.  No residual stenosis.  Preserved runoff down the anterior tibial with filling of the dorsalis pedis.             Procedure:  The patient was identified in the holding area and taken to room 8.  The patient was then placed supine on the table and prepped and draped in the usual sterile fashion.  A time out was called.  Ultrasound was used to evaluate the left common  femoral artery.  It was patent .  A digital ultrasound image was acquired.  A micropuncture needle was used to access the left common femoral artery under ultrasound guidance.  An 018 wire was advanced without resistance and a micropuncture sheath was placed.  The 018 wire was removed and a benson wire was placed.  The micropuncture sheath was exchanged for a 5 french sheath.  An omniflush catheter was advanced over the wire to the level of L-1.  An abdominal angiogram was obtained.  Next, using the omniflush catheter and a benson wire, the aortic bifurcation was crossed and the catheter was placed into theright external iliac artery and right runoff was obtained.  After evaluating images elected to intervene on her right anterior tibial artery where she had recurrent disease.  Ultimately used a Rosen wire down the right  SFA and exchanged for a 5 [place] Ansell sheath in the left groin over the aortic bifurcation.  Patient was given 100 units/kg herparinIV.  I then used a CXI catheter with a V 18 and I was able to cross the SFA popliteal into the anterior tibial and got my wire into the foot where I confirmed on hand-injection that I was in the true lumen.  I then used a 3 mm x 220 mm Sterling and the entire anterior tibial was angioplastied to nominal pressure for 2 minutes.  No residual disease with good runoff into the foot.  That point time wires and catheters were removed exchanged for a short 5 French sheath in the left groin.  A mynx closure device was deployed.   Plan: Patient will be posted for right TMA tomorrow with Dr. Donzetta Matters.  This will be very high risk given the right foot plantar surface soft tissue is marginal.  She is not optimized from a vascular surgery standpoint.  [name], MD [facility] Office: [place] 2 Views Right  Result Date: 08/08/2020 CLINICAL DATA:  Redness and drainage right foot EXAM: RIGHT FOOT - 2 VIEW COMPARISON:   02/22/2020 FINDINGS: Bones appear osteopenic. Old fracture deformity base of fifth metatarsal. Apparent partial amputation of third digit with residual proximal phalanx. Slight erosive change at the cut end of the proximal phalanx. Suspected bony resorption of the second distal phalanx. Possible erosions at the base of the second and third proximal phalanges and head of third metatarsal. Vascular calcifications. 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 IMPRESSION: 1. Apparent interval partial amputation third digit with remnant proximal phalanx. Findings suspicious for osteomyelitis involving the distal phalanx of the second digit and the cut end of the residual third proximal phalanx. There may be additional erosive change involving the bases of the second and third proximal phalanges and head of third metatarsal. 2. 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 Electronically Signed   By: Donavan Foil M.D.   On: 08/08/2020 21:01     LOS: 2 days   Antonieta Pert, MD [facility]  08/10/2020, 10:57 AM

## 2020-08-10 NOTE — Progress Notes (Signed)
[facility] Progress Note   Dialysis Orders: [facility] MWF 4hrs 2K 2.5Ca EDW 97 kg RU AVG Heparin 1000 load and 1000 units qhr EPO 14000 units qHD Venofer '100mg'$  qHD Hectorol 1.36mg qHD  Assessment/ Plan:   1. 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 CCarlis AbbottVVS w/ successful PTA of rt AT. - Plan for rt TMA tomorrow with Dr. CDonzetta Matters 2.  ESRD:  MWF outpatient schedulevia RUA AVG-  Able to  net UF 2L on 4/13 Will hold HD tomorrow and plan for HD Sat to avoid surgery and RRT on the same day; pt agrees  3. HTN/ volume: B Restart home meds (usually on metoprolol and norvasc) and UF on RRT will help as well.  4. DM2- peradmitting team. 5. Anemiaof ESRD:  Aranesp  200 qFriday and not candidate for IV iron with active infection. Transfuse as needed as she may not respond to ESA with an active infection. 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 Continue renvela and hectorol.    Subjective:   Denies f/c/n/v/ dyspnea/ cp.    Objective:   BP (!) 134/49   Pulse 76   Temp 98.3 F (36.8 C) (Oral)   Resp 17   Ht '5\' 3"'$  (1.6 m)   Wt 96.8 kg   LMP  (LMP Unknown)   SpO2 100%   BMI 37.80 kg/m   Intake/Output Summary (Last 24 hours) at 08/10/2020 1030 Last data filed at 08/09/2020 1623 Gross per 24 hour  Intake --  Output 2000 ml  Net -2000 ml   Weight change: 1.277 kg  Physical Exam: GEN: NAD, A&Ox3, NCAT HEENT: No conjunctival pallor, EOMI NECK: Supple, no thyromegaly LUNGS: CTA B/L no rales, rhonchi or wheezing CV: RRR, No M/R/G ABD: SNDNT +BS  EXT: Lt BKA and gangrene on right ACCESS: RUA AVG  good bruit    Imaging: PERIPHERAL VASCULAR CATHETERIZATION  Result Date: 08/10/2020 Patient name: Christy Zhang        MRN: [id number]       DOB: 105-22-1957         Sex: female  08/10/2020 Pre-operative Diagnosis: Critical 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  Ultrasound-guided access of left common femoral artery 2.  Aortogram including catheter selection of aorta 3.  Right lower extremity arteriogram with selection of third order branches 4.  Right anterior tibial artery angioplasty (3 mm x 220 mm Sterling) 5.  Mynx closure of the left common femoral artery 6.  44 minutes of monitored moderate conscious sedation time  Indications: Patient is a 65year old female with multiple medical comorbidities including diabetes and end-stage renal disease who presents for right lower extremity arteriogram and intervention given history of PAD with known tibial disease and right foot tissue loss.  She presents today after risk benefits discussed.  Findings:  Aortogram showed approximate 50% stenosis of right renal artery while the left renal artery appears widely patent (she is ESRD).  Otherwise the aortoiliac segment was widely patent with no flow limiting stenosis.  Right lower extremity arteriogram showed a patent common femoral, profunda, SFA, above and below-knee popliteal artery.  Patient has severe tibial disease.  She has single-vessel runoff via an anterior tibial that had recurrent high-grade stenosis with multiple focal lesions 50 to 70% stenosis throughout the vessel with flow into the dorsalis pedis in the foot.  Peroneal and posterior tibial arteries appeared occluded.  Ultimately the right anterior tibial artery was crossed into the foot and the entire anterior tibial artery was angioplastied with a 3 mm Sterling to nominal pressure for 2 minutes.  Excellent results.  No residual stenosis.  Preserved runoff down the anterior tibial with filling of the dorsalis pedis.             Procedure:  The patient was identified in the holding area and taken to room 8.  The patient was then placed supine on the table and prepped and draped in the usual sterile fashion.  A time out was called.  Ultrasound was used to evaluate  the left common femoral artery.  It was patent .  A digital ultrasound image was acquired.  A micropuncture needle was used to access the left common femoral artery under ultrasound guidance.  An 018 wire was advanced without resistance and a micropuncture sheath was placed.  The 018 wire was removed and a benson wire was placed.  The micropuncture sheath was exchanged for a 5 french sheath.  An omniflush catheter was advanced over the wire to the level of L-1.  An abdominal angiogram was obtained.  Next, using the omniflush catheter and a benson wire, the aortic bifurcation was crossed and the catheter was placed into theright external iliac artery and right runoff was obtained.  After evaluating images elected to intervene on her right anterior tibial artery where she had recurrent disease.  Ultimately used a Rosen wire down the right SFA and exchanged for a 5 [place] Ansell sheath in the left groin over the aortic bifurcation.  Patient was given 100 units/kg herparinIV.  I then used a CXI catheter with a V 18 and I was able to cross the SFA popliteal into the anterior tibial and got my wire into the foot where I confirmed on hand-injection that I was in the true lumen.  I then used a 3 mm x 220 mm Sterling and the entire anterior tibial was angioplastied to nominal pressure for 2 minutes.  No residual disease with good runoff into the foot.  That point time wires and catheters were removed exchanged for a short 5 French sheath in the left groin.  A mynx closure device was deployed.   Plan: Patient will be posted for right TMA tomorrow with Dr. Donzetta Matters.  This will be very high risk given the right foot plantar surface soft tissue is marginal.  She is not optimized from a vascular surgery standpoint.  [name], MD [facility] [place] Office: [place] 2 Views Right  Result Date: 08/08/2020 CLINICAL DATA:  Redness and drainage right foot EXAM: RIGHT FOOT - 2 VIEW  COMPARISON:  02/22/2020 FINDINGS: Bones appear osteopenic. Old fracture deformity base of fifth metatarsal. Apparent partial amputation of third digit with residual proximal phalanx. Slight erosive change at the cut end of the proximal phalanx. Suspected bony resorption of the second distal phalanx. Possible erosions at the base of the second and third proximal phalanges and head of third metatarsal. Vascular calcifications. 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 IMPRESSION: 1. Apparent interval partial amputation third digit with remnant proximal phalanx. Findings suspicious for osteomyelitis involving the distal phalanx of the second digit and the cut end of the residual third proximal phalanx. There may be additional erosive change involving the bases of the second and  third proximal phalanges and head of third metatarsal. 2. 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 Electronically Signed   By: Donavan Foil M.D.   On: 08/08/2020 21:01    Labs: BMET Recent Labs  Lab 08/08/20 1855 08/09/20 0348 08/10/20 0355  NA 136 135 135  K 4.7 4.3 3.8  CL 96* 98 97*  CO2 '26 25 28  '$ GLUCOSE 166* 93 114*  BUN '20 22 15  '$ CREATININE 7.67* 8.25* 5.23*  CALCIUM 8.9 8.7* 8.7*   CBC Recent Labs  Lab 08/08/20 1855 08/09/20 0348 08/10/20 0355  WBC 18.3* 16.9* 16.5*  NEUTROABS 14.3*  --   --   HGB 9.2* 8.6* 8.9*  HCT 31.3* 29.1* 29.7*  MCV 84.6 82.9 82.5  PLT 894* 782* 762*    Medications:    . (feeding supplement) PROSource Plus  30 mL Oral BID BM  . amLODipine  5 mg Oral QHS  . aspirin EC  81 mg Oral QHS  . atorvastatin  40 mg Oral Daily  . Chlorhexidine Gluconate Cloth  6 each Topical Q0600  . clopidogrel  75 mg Oral QPM  . doxercalciferol  1.5 mcg Intravenous Q M,W,F-HD  . feeding supplement (NEPRO CARB STEADY)  237 mL Oral Q24H  .  gabapentin  300 mg Oral QHS  . heparin  5,000 Units Subcutaneous Q8H  . insulin aspart  0-6 Units Subcutaneous TID WC  . insulin glargine  10 Units Subcutaneous Daily  . metoprolol succinate  50 mg Oral Daily  . metroNIDAZOLE  500 mg Oral Q8H  . multivitamin  1 tablet Oral QHS  . polyethylene glycol  17 g Oral Daily  . senna-docusate  1 tablet Oral BID  . sevelamer carbonate  2,400 mg Oral TID WC  . sodium chloride flush  3 mL Intravenous Q12H      Otelia Santee, MD 08/10/2020, 10:30 AM

## 2020-08-11 ENCOUNTER — Encounter (HOSPITAL_COMMUNITY)
Admission: EM | Disposition: A | Discharge status: Home Health | Payer: Self-pay | Source: Home / Self Care | Attending: Internal Medicine

## 2020-08-11 ENCOUNTER — Inpatient Hospital Stay (HOSPITAL_COMMUNITY): Payer: Medicare Other | Admitting: Anesthesiology

## 2020-08-11 DIAGNOSIS — M86471 Chronic osteomyelitis with draining sinus, right ankle and foot: Secondary | ICD-10-CM | POA: Diagnosis not present

## 2020-08-11 DIAGNOSIS — L8990 Pressure ulcer of unspecified site, unspecified stage: Secondary | ICD-10-CM | POA: Insufficient documentation

## 2020-08-11 HISTORY — PX: TRANSMETATARSAL AMPUTATION: SHX6197

## 2020-08-11 LAB — CBC
HCT: 27 % — ABNORMAL LOW (ref 36.0–46.0)
Hemoglobin: 8.1 g/dL — ABNORMAL LOW (ref 12.0–15.0)
MCH: 24.7 pg — ABNORMAL LOW (ref 26.0–34.0)
MCHC: 30 g/dL (ref 30.0–36.0)
MCV: 82.3 fL (ref 80.0–100.0)
Platelets: 751 10*3/uL — ABNORMAL HIGH (ref 150–400)
RBC: 3.28 MIL/uL — ABNORMAL LOW (ref 3.87–5.11)
RDW: 18 % — ABNORMAL HIGH (ref 11.5–15.5)
WBC: 15.5 10*3/uL — ABNORMAL HIGH (ref 4.0–10.5)
nRBC: 0 % (ref 0.0–0.2)

## 2020-08-11 LAB — BASIC METABOLIC PANEL
Anion gap: 10 (ref 5–15)
BUN: 32 mg/dL — ABNORMAL HIGH (ref 8–23)
CO2: 26 mmol/L (ref 22–32)
Calcium: 8.5 mg/dL — ABNORMAL LOW (ref 8.9–10.3)
Chloride: 96 mmol/L — ABNORMAL LOW (ref 98–111)
Creatinine, Ser: 7.01 mg/dL — ABNORMAL HIGH (ref 0.44–1.00)
GFR, Estimated: 6 mL/min — ABNORMAL LOW (ref >60)
Glucose, Bld: 149 mg/dL — ABNORMAL HIGH (ref 70–99)
Potassium: 3.9 mmol/L (ref 3.5–5.1)
Sodium: 132 mmol/L — ABNORMAL LOW (ref 135–145)

## 2020-08-11 LAB — SURGICAL PCR SCREEN
MRSA, PCR: NEGATIVE
Staphylococcus aureus: NEGATIVE

## 2020-08-11 LAB — GLUCOSE, CAPILLARY
Glucose-Capillary: 136 mg/dL — ABNORMAL HIGH (ref 70–99)
Glucose-Capillary: 126 mg/dL — ABNORMAL HIGH (ref 70–99)
Glucose-Capillary: 270 mg/dL — ABNORMAL HIGH (ref 70–99)
Glucose-Capillary: 235 mg/dL — ABNORMAL HIGH (ref 70–99)
Glucose-Capillary: 323 mg/dL — ABNORMAL HIGH (ref 70–99)

## 2020-08-11 LAB — SEDIMENTATION RATE: Sed Rate: 92 mm/hr — ABNORMAL HIGH (ref 0–22)

## 2020-08-11 LAB — C-REACTIVE PROTEIN: CRP: 17.7 mg/dL — ABNORMAL HIGH (ref <1.0)

## 2020-08-11 SURGERY — AMPUTATION, FOOT, TRANSMETATARSAL
Anesthesia: Regional | Site: Foot | Laterality: Right

## 2020-08-11 MED ORDER — HYDROMORPHONE HCL 1 MG/ML IJ SOLN: 0.2500 mg | INTRAMUSCULAR | Status: DC | PRN

## 2020-08-11 MED ORDER — SODIUM CHLORIDE 0.9 % IV SOLN: INTRAVENOUS | Status: DC | PRN

## 2020-08-11 MED ORDER — PROPOFOL 10 MG/ML IV BOLUS
INTRAVENOUS | Status: AC
  Filled 2020-08-11: qty 20

## 2020-08-11 MED ORDER — HYDROMORPHONE HCL 1 MG/ML IJ SOLN
1.0000 mg | INTRAMUSCULAR | Status: DC | PRN
  Administered 2020-08-14: 1 mg via INTRAVENOUS
  Filled 2020-08-11: qty 1

## 2020-08-11 MED ORDER — MIDAZOLAM HCL 2 MG/2ML IJ SOLN
INTRAMUSCULAR | Status: AC
  Filled 2020-08-11: qty 2

## 2020-08-11 MED ORDER — FENTANYL CITRATE (PF) 250 MCG/5ML IJ SOLN
INTRAMUSCULAR | Status: DC | PRN
  Administered 2020-08-11: 100 ug via INTRAVENOUS

## 2020-08-11 MED ORDER — DEXAMETHASONE SODIUM PHOSPHATE 4 MG/ML IJ SOLN
INTRAMUSCULAR | Status: DC | PRN
  Administered 2020-08-11: 5 mg via PERINEURAL

## 2020-08-11 MED ORDER — MIDAZOLAM HCL 2 MG/2ML IJ SOLN
INTRAMUSCULAR | Status: DC | PRN
  Administered 2020-08-11: 2 mg via INTRAVENOUS

## 2020-08-11 MED ORDER — PROMETHAZINE HCL 25 MG/ML IJ SOLN: 6.2500 mg | INTRAMUSCULAR | Status: DC | PRN

## 2020-08-11 MED ORDER — FENTANYL CITRATE (PF) 250 MCG/5ML IJ SOLN
INTRAMUSCULAR | Status: AC
  Filled 2020-08-11: qty 5

## 2020-08-11 MED ORDER — PROPOFOL 500 MG/50ML IV EMUL
INTRAVENOUS | Status: DC | PRN
  Administered 2020-08-11: 30 ug/kg/min via INTRAVENOUS

## 2020-08-11 MED ORDER — 0.9 % SODIUM CHLORIDE (POUR BTL) OPTIME
TOPICAL | Status: DC | PRN
  Administered 2020-08-11: 1000 mL

## 2020-08-11 MED ORDER — MEPERIDINE HCL 25 MG/ML IJ SOLN: 6.2500 mg | INTRAMUSCULAR | Status: DC | PRN

## 2020-08-11 MED ORDER — LIDOCAINE 2% (20 MG/ML) 5 ML SYRINGE
INTRAMUSCULAR | Status: DC | PRN
  Administered 2020-08-11: 100 mg via INTRAVENOUS

## 2020-08-11 MED ORDER — OXYCODONE HCL 5 MG PO TABS
5.0000 mg | ORAL_TABLET | ORAL | Status: DC | PRN
  Administered 2020-08-11 – 2020-08-18 (×18): 5 mg via ORAL
  Filled 2020-08-11 (×17): qty 1

## 2020-08-11 MED ORDER — VANCOMYCIN HCL IN DEXTROSE 1-5 GM/200ML-% IV SOLN
1000.0000 mg | INTRAVENOUS | Status: AC
  Filled 2020-08-11: qty 200

## 2020-08-11 MED ORDER — GLYCOPYRROLATE 0.2 MG/ML IJ SOLN
INTRAMUSCULAR | Status: DC | PRN
  Administered 2020-08-11: .1 mg via INTRAVENOUS

## 2020-08-11 MED ORDER — CLONIDINE HCL (ANALGESIA) 100 MCG/ML EP SOLN
EPIDURAL | Status: DC | PRN
  Administered 2020-08-11: 80 ug

## 2020-08-11 MED ORDER — ROPIVACAINE HCL 7.5 MG/ML IJ SOLN
INTRAMUSCULAR | Status: DC | PRN
  Administered 2020-08-11: 30 mL via PERINEURAL

## 2020-08-11 MED ORDER — GLYCOPYRROLATE PF 0.2 MG/ML IJ SOSY
PREFILLED_SYRINGE | INTRAMUSCULAR | Status: AC
  Filled 2020-08-11: qty 1

## 2020-08-11 SURGICAL SUPPLY — 31 items
BLADE AVERAGE 25X9 (BLADE) IMPLANT
BLADE SAW SGTL 81X20 HD (BLADE) ×2 IMPLANT
BNDG CONFORM 3 STRL LF (GAUZE/BANDAGES/DRESSINGS) IMPLANT
BNDG ELASTIC 3X5.8 VLCR STR LF (GAUZE/BANDAGES/DRESSINGS) ×2 IMPLANT
BNDG ELASTIC 4X5.8 VLCR STR LF (GAUZE/BANDAGES/DRESSINGS) ×2 IMPLANT
BNDG GAUZE ELAST 4 BULKY (GAUZE/BANDAGES/DRESSINGS) ×2 IMPLANT
CANISTER SUCT 3000ML PPV (MISCELLANEOUS) ×2 IMPLANT
COVER SURGICAL LIGHT HANDLE (MISCELLANEOUS) ×2 IMPLANT
DRAPE EXTREMITY T 121X128X90 (DISPOSABLE) ×2 IMPLANT
DRAPE HALF SHEET 40X57 (DRAPES) ×2 IMPLANT
ELECT REM PT RETURN 9FT ADLT (ELECTROSURGICAL) ×2
ELECTRODE REM PT RTRN 9FT ADLT (ELECTROSURGICAL) ×1 IMPLANT
GAUZE SPONGE 4X4 12PLY STRL (GAUZE/BANDAGES/DRESSINGS) ×2 IMPLANT
GLOVE BIO SURGEON STRL SZ7.5 (GLOVE) ×2 IMPLANT
GOWN STRL REUS W/ TWL LRG LVL3 (GOWN DISPOSABLE) ×2 IMPLANT
GOWN STRL REUS W/ TWL XL LVL3 (GOWN DISPOSABLE) ×1 IMPLANT
GOWN STRL REUS W/TWL LRG LVL3 (GOWN DISPOSABLE) ×2
GOWN STRL REUS W/TWL XL LVL3 (GOWN DISPOSABLE) ×1
KIT BASIN OR (CUSTOM PROCEDURE TRAY) ×2 IMPLANT
KIT TURNOVER KIT B (KITS) ×2 IMPLANT
NEEDLE HYPO 25GX1X1/2 BEV (NEEDLE) IMPLANT
NS IRRIG 1000ML POUR BTL (IV SOLUTION) ×2 IMPLANT
PACK GENERAL/GYN (CUSTOM PROCEDURE TRAY) ×2 IMPLANT
PAD ARMBOARD 7.5X6 YLW CONV (MISCELLANEOUS) ×4 IMPLANT
SPECIMEN JAR SMALL (MISCELLANEOUS) ×2 IMPLANT
SUT ETHILON 3 0 PS 1 (SUTURE) ×4 IMPLANT
SYR CONTROL 10ML LL (SYRINGE) IMPLANT
TOWEL GREEN STERILE (TOWEL DISPOSABLE) ×4 IMPLANT
TOWEL GREEN STERILE FF (TOWEL DISPOSABLE) ×2 IMPLANT
UNDERPAD 30X36 HEAVY ABSORB (UNDERPADS AND DIAPERS) ×2 IMPLANT
WATER STERILE IRR 1000ML POUR (IV SOLUTION) ×2 IMPLANT

## 2020-08-11 NOTE — Progress Notes (Signed)
  Progress Note    08/11/2020 7:17 AM Day of Surgery  Subjective:  No overnight issues  Vitals:   08/11/20 0400 08/11/20 0600  BP: (!) 143/58   Pulse: 76   Resp: 17 20  Temp: 98.9 F (37.2 C)   SpO2:      Physical Exam: aaox3 Non labored respirations Right foot dressing cdi  CBC    Component Value Date/Time   WBC 15.5 (H) 08/11/2020 0245   RBC 3.28 (L) 08/11/2020 0245   HGB 8.1 (L) 08/11/2020 0245   HGB 9.2 (L) 07/20/2020 1438   HCT 27.0 (L) 08/11/2020 0245   HCT 31.5 (L) 07/20/2020 1438   PLT 751 (H) 08/11/2020 0245   PLT 716 (H) 07/20/2020 1438   MCV 82.3 08/11/2020 0245   MCV 85 07/20/2020 1438   MCH 24.7 (L) 08/11/2020 0245   MCHC 30.0 08/11/2020 0245   RDW 18.0 (H) 08/11/2020 0245   RDW 16.1 (H) 07/20/2020 1438   LYMPHSABS 2.0 08/08/2020 1855   LYMPHSABS 2.0 07/20/2020 1438   MONOABS 1.1 (H) 08/08/2020 1855   EOSABS 0.7 (H) 08/08/2020 1855   EOSABS 0.5 (H) 07/20/2020 1438   BASOSABS 0.1 08/08/2020 1855   BASOSABS 0.1 07/20/2020 1438    BMET    Component Value Date/Time   NA 132 (L) 08/11/2020 0245   NA 139 07/20/2020 1438   K 3.9 08/11/2020 0245   CL 96 (L) 08/11/2020 0245   CO2 26 08/11/2020 0245   GLUCOSE 149 (H) 08/11/2020 0245   BUN 32 (H) 08/11/2020 0245   BUN 24 07/20/2020 1438   CREATININE 7.01 (H) 08/11/2020 0245   CALCIUM 8.5 (L) 08/11/2020 0245   GFRNONAA 6 (L) 08/11/2020 0245   GFRAA 8 (L) 04/06/2020 1113    INR    Component Value Date/Time   INR 1.2 08/10/2020 0355     Intake/Output Summary (Last 24 hours) at 08/11/2020 0717 Last data filed at 08/10/2020 1608 Gross per 24 hour  Intake 95 ml  Output --  Net 95 ml     Assessment:  65 y.o. female is s/p R AT pta with necrosis of distal foot on the right  Plan: OR today for tma. I have discussed possibility of need for open tma with possible wound vaca and high likelihood of more proximal amputation in the near future.    Hercules Hasler C. Donzetta Matters, MD [facility] [place] Office: [phone] Pager: [phone]  08/11/2020 7:17 AM

## 2020-08-11 NOTE — Anesthesia Procedure Notes (Signed)
Anesthesia Regional Block: Popliteal block   Pre-Anesthetic Checklist: ,, timeout performed, Correct Patient, Correct Site, Correct Laterality, Correct Procedure, Correct Position, site marked, Risks and benefits discussed,  Surgical consent,  Pre-op evaluation,  At surgeon's request and post-op pain management  Laterality: Right  Prep: chloraprep       Needles:  Injection technique: Single-shot  Needle Type: Stimiplex     Needle Length: 10cm  Needle Gauge: 21     Additional Needles:   Procedures:,,,, ultrasound used (permanent image in chart),,,,  Motor weakness within 5 minutes.  Narrative:  Start time: 08/11/2020 6:50 AM End time: 08/11/2020 7:06 AM Injection made incrementally with aspirations every 5 mL.  Performed by: Personally  Anesthesiologist: [name], MD  Additional Notes: Nerve located and needle positioned with direct ultrasound guidance. Good perineural spread. Patient tolerated well.

## 2020-08-11 NOTE — Progress Notes (Signed)
Mobility Specialist - Progress Note   08/11/20 1352  Mobility  Activity Refused mobility   Pt refused scooting transfer to recliner. States she'd like to wait until tomorrow.   Pricilla Handler Mobility Specialist Mobility Specialist Phone: [phone]

## 2020-08-11 NOTE — Progress Notes (Signed)
     I discussed intraoperative findings with the patient as well as high risk for transtibial amputation on the right.  She demonstrates good understanding.  We will change the wound tomorrow to evaluate the tissue and decide how and when to move forward.   Valiant Dills C. Donzetta Matters, MD [facility] [place] Office: [phone] Pager: [phone]

## 2020-08-11 NOTE — Progress Notes (Signed)
[facility] Progress Note   Dialysis Orders: [facility] MWF 4hrs 2K 2.5Ca EDW 97 kg RU AVG Heparin 1000 load and 1000 units qhr EPO 14000 units qHD Venofer '100mg'$  qHD Hectorol 1.64mg qHD  Assessment/ Plan:   1. 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 CCarlis AbbottVVS w/ successful PTA of rt AT. -Underwent surgery with Dr. CDonzetta Matterstoday.  No complications  2.  ESRD:  MWF outpatient schedulevia RUA AVG-  Planning for dialysis on Saturday given she went to the OR today.  Then resume her MWF schedule  3. HTN/ volume: B Restart home meds (usually on metoprolol and norvasc) and UF on RRT will help as well.  4. DM2- peradmitting team. 5. Anemiaof ESRD:  Aranesp  200 qFriday and not candidate for IV iron with active infection. Transfuse as needed as she may not respond to ESA with an active infection. 6. MBDof ESRD-Continue renvela and hectorol.    Subjective:   Patient underwent right transmetatarsal amputation.  Return to room.  Feeling okay without any complaints.   Objective:   BP 136/78 (BP Location: Right Arm)   Pulse 72   Temp 98.2 F (36.8 C) (Oral)   Resp 16   Ht '5\' 3"'$  (1.6 m)   Wt 100 kg   LMP  (LMP Unknown)   SpO2 100%   BMI 39.05 kg/m   Intake/Output Summary (Last 24 hours) at 08/11/2020 1002 Last data filed at 08/10/2020 1608 Gross per 24 hour  Intake 95 ml  Output --  Net 95 ml   Weight change: 1.2 kg  Physical Exam: GEN: NAD, A&Ox3, NCAT HEENT: No conjunctival pallor, EOMI NECK: Supple, no thyromegaly LUNGS: Bilateral chest rise with no increased work of breathing CV: RRR, No M/R/G ABD: SNDNT +BS  EXT: Lt BKA and R foot in bandage ACCESS: RUA AVG  good bruit    Imaging: PERIPHERAL VASCULAR CATHETERIZATION  Result Date: 08/10/2020 Patient name: Christy Zhang        MRN: [id number]       DOB: 1May 18, 1957         Sex: female  08/10/2020 Pre-operative Diagnosis: Critical limb ischemia of the right lower  extremity with tissue loss Post-operative diagnosis:  Same Surgeon:  CMarty Heck MD Procedure Performed: 1.  Ultrasound-guided access of left common femoral artery 2.  Aortogram including catheter selection of aorta 3.  Right lower extremity arteriogram with selection of third order branches 4.  Right anterior tibial artery angioplasty (3 mm x 220 mm Sterling) 5.  Mynx closure of the left common femoral artery 6.  44 minutes of monitored moderate conscious sedation time  Indications: Patient is a 65year old female with multiple medical comorbidities including diabetes and end-stage renal disease who presents for right lower extremity arteriogram and intervention given history of PAD with known tibial disease and right foot tissue loss.  She presents today after risk benefits discussed.  Findings:  Aortogram showed approximate 50% stenosis of right renal artery while the left renal artery appears widely patent (she is ESRD).  Otherwise the aortoiliac segment was widely patent with no flow limiting stenosis.  Right lower extremity arteriogram showed a patent common femoral, profunda, SFA, above and below-knee popliteal artery.  Patient has severe tibial disease.  She has single-vessel runoff via an anterior tibial that had recurrent high-grade stenosis with multiple focal lesions 50 to 70% stenosis throughout the vessel with flow into the dorsalis pedis in the foot.  Peroneal and posterior tibial arteries appeared occluded.  Ultimately the right anterior tibial artery was crossed into the foot and the entire anterior tibial artery was angioplastied with a 3 mm Sterling to nominal pressure for 2 minutes.  Excellent results.  No residual stenosis.  Preserved runoff down the anterior tibial with filling of the dorsalis pedis.             Procedure:  The patient was identified in the holding area and taken to room 8.  The patient was then placed supine on the table and prepped and draped in the usual  sterile fashion.  A time out was called.  Ultrasound was used to evaluate the left common femoral artery.  It was patent .  A digital ultrasound image was acquired.  A micropuncture needle was used to access the left common femoral artery under ultrasound guidance.  An 018 wire was advanced without resistance and a micropuncture sheath was placed.  The 018 wire was removed and a benson wire was placed.  The micropuncture sheath was exchanged for a 5 french sheath.  An omniflush catheter was advanced over the wire to the level of L-1.  An abdominal angiogram was obtained.  Next, using the omniflush catheter and a benson wire, the aortic bifurcation was crossed and the catheter was placed into theright external iliac artery and right runoff was obtained.  After evaluating images elected to intervene on her right anterior tibial artery where she had recurrent disease.  Ultimately used a Rosen wire down the right SFA and exchanged for a 5 [place] Ansell sheath in the left groin over the aortic bifurcation.  Patient was given 100 units/kg herparinIV.  I then used a CXI catheter with a V 18 and I was able to cross the SFA popliteal into the anterior tibial and got my wire into the foot where I confirmed on hand-injection that I was in the true lumen.  I then used a 3 mm x 220 mm Sterling and the entire anterior tibial was angioplastied to nominal pressure for 2 minutes.  No residual disease with good runoff into the foot.  That point time wires and catheters were removed exchanged for a short 5 French sheath in the left groin.  A mynx closure device was deployed.   Plan: Patient will be posted for right TMA tomorrow with Dr. Donzetta Matters.  This will be very high risk given the right foot plantar surface soft tissue is marginal.  She is not optimized from a vascular surgery standpoint.  [name], MD [facility] [place] Office: [phone]   Labs: BMET Recent Labs  Lab 08/08/20 1855  08/09/20 0348 08/10/20 0355 08/11/20 0245  NA 136 135 135 132*  K 4.7 4.3 3.8 3.9  CL 96* 98 97* 96*  CO2 '26 25 28 26  '$ GLUCOSE 166* 93 114* 149*  BUN '20 22 15 '$ 32*  CREATININE 7.67* 8.25* 5.23* 7.01*  CALCIUM 8.9 8.7* 8.7* 8.5*   CBC Recent Labs  Lab 08/08/20 1855 08/09/20 0348 08/10/20 0355 08/11/20 0245  WBC 18.3* 16.9* 16.5* 15.5*  NEUTROABS 14.3*  --   --   --   HGB 9.2* 8.6* 8.9* 8.1*  HCT 31.3* 29.1* 29.7* 27.0*  MCV 84.6 82.9 82.5 82.3  PLT 894* 782* 762* 751*    Medications:    . (feeding supplement) PROSource Plus  30 mL Oral BID BM  . amLODipine  5 mg Oral QHS  . aspirin EC  81  mg Oral QHS  . atorvastatin  40 mg Oral Daily  . Chlorhexidine Gluconate Cloth  6 each Topical Q0600  . clopidogrel  75 mg Oral QPM  . doxercalciferol  1.5 mcg Intravenous Q M,W,F-HD  . feeding supplement (NEPRO CARB STEADY)  237 mL Oral Q24H  . gabapentin  300 mg Oral QHS  . heparin  5,000 Units Subcutaneous Q8H  . insulin aspart  0-6 Units Subcutaneous TID WC  . insulin glargine  10 Units Subcutaneous Daily  . metoprolol succinate  50 mg Oral Daily  . metroNIDAZOLE  500 mg Oral Q8H  . multivitamin  1 tablet Oral QHS  . polyethylene glycol  17 g Oral Daily  . senna-docusate  1 tablet Oral BID  . sevelamer carbonate  2,400 mg Oral TID WC  . sodium chloride flush  3 mL Intravenous Q12H     Reesa Chew  08/11/2020, 10:02 AM

## 2020-08-11 NOTE — Transfer of Care (Signed)
Immediate Anesthesia Transfer of Care Note  Patient: Christy Zhang  Procedure(s) Performed: RIGHT TRANSMETATARSAL AMPUTATION (Right Foot)  Patient Location: PACU  Anesthesia Type:MAC  Level of Consciousness: awake, alert  and oriented  Airway & Oxygen Therapy: Patient connected to face mask oxygen  Post-op Assessment: Post -op Vital signs reviewed and stable  Post vital signs: stable  Last Vitals:  Vitals Value Taken Time  BP    Temp    Pulse    Resp    SpO2      Last Pain:  Vitals:   08/11/20 0400  TempSrc: Oral  PainSc:          Complications: No complications documented.

## 2020-08-11 NOTE — Progress Notes (Signed)
Pt arrived back from surgery, VSS, dressing is clean dry and intact, reports no pain. CHG complete, call light within reach.   Chrisandra Carota, RN 08/11/2020

## 2020-08-11 NOTE — Progress Notes (Signed)
PROGRESS NOTE    Christy Zhang  [id number] DOB: Dec 11, 1955 DOA: 08/08/2020 PCP: Claretta Fraise, MD   Chief Complaint  Patient presents with  . Foot Pain  Brief Narrative: 65 year old female with significant complex medical history of ESRD on HD insulin-dependent diabetes, hypertension, chronic pain, peripheral arterial disease, right foot infection presents to the ED with progressive discoloration and dryness of the right foot.    As per to report  patient " has been following with vascular surgery, was scheduled for arteriogram on 08/10/2020, and had just completed a course of Keflex, but continued to have progressive discoloration of the right forefoot with increasing drainage from the third toe, prompting her presentation to the ED.  She denies any fevers, chills, or pain.  She reports completing dialysis without incident yesterday.  She denies any chest pain, cough, or shortness of breath. Upon arrival to the ED, patient is found to be afebrile, saturating well on room air, and with blood pressure 175/63.  EKG features a sinus rhythm.  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  Blood culture was collected in the ED, cefepime and vancomycin were administered, vascular surgery was consulted by the ED physician, and hospitalists asked to admit"  Patient is continued on broad-spectrum IV antibiotics.  4/14 underwent aortogram right lower extremity arteriogram status post right anterior tibial artery angioplasty, found to have critical limb ischemia of the right lower extremity with tissue loss, 4/15  s/p right TMA  Subjective: Seen  and examined this morning she had surgery this morning and the right foot is in dressing.  No new complaints.  Assessment & Plan:  Critical right lower extremity ischemia with tissue loss Gangrenous right mid foot with extensive wound Is status post right lower extremity arteriogram-s/p right anterior tibial artery angioplasty.  He is status post open right TMA-continue dressing, continue current broad-spectrum antibiotics Vanco/Flagyl/ceftriaxone.  Per vascular patient will more than likely require transtibial amputation on the right.   Recent Labs  Lab 08/08/20 1855 08/09/20 0348 08/10/20 0355 08/11/20 0245  WBC 18.3* 16.9* 16.5* 15.5*  LATICACIDVEN 1.4  --   --   --    lft BKA recently-continue wound care, nursign dressing.  ESRD and HD [id number] following closely, dialysis today or tomorrow per nephrology.  Continue to monitor Secondary hyperparathyroidism monitor calcium and phosphorus.  Continue Renvela.    PVD: See #1.  Continue her aspirin Plavix and statin.   Diabetes mellitus insulin-dependent with ESRD with long-term insulin:blood sugar is stable currently.Hemoglobin A1c 8.8 uncontrolled.  Continue Lantus 10 units, SSI Recent Labs  Lab 08/10/20 1207 08/10/20 1702 08/10/20 2147 08/11/20 0610 08/11/20 0829  GLUCAP 100* 110* 174* 136* 126*   Essential (primary) hypertension: Well-controlled on amlodipine, metoprolol.  Monitor.  Anemia of chronic renal disease.  Anemia in the setting of chronic kidney disease, monitor and transfuse if less than 7 g.   Recent Labs  Lab 08/08/20 1855 08/09/20 0348 08/10/20 0355 08/11/20 0245  HGB 9.2* 8.6* 8.9* 8.1*  HCT 31.3* 29.1* 29.7* 27.0*   Thrombocytosis: Likely from osteomyelitis.  Monitor  Morbid obesity with BMI 38.  Will benefit with weight loss and PCP follow-up.  Diet Order  Diet regular Room service appropriate? Yes; Fluid consistency: Thin  Diet effective now                Patient's Body mass  index is 39.05 kg/m. DVT prophylaxis: heparin injection 5,000 Units Start: 08/09/20 1400 Code Status:   Code Status: Full Code  Family Communication: plan of care discussed with patient at bedside.  Daughter updated yesterday Status is: Inpatient Remains inpatient appropriate because:IV treatments appropriate due to intensity of illness or inability to take PO and Inpatient level of care appropriate due to severity of illness remains hospitalized for ongoing management of gangrenous right foot.  Dispo: The patient is from: Home              Anticipated d/c is to: TBD, anticipate hospitalization through the weekend- early next week.              Patient currently is not medically stable to d/c.   Difficult to place patient No   Unresulted Labs (From admission, onward)         None    Medications reviewed:  Scheduled Meds: . (feeding supplement) PROSource Plus  30 mL Oral BID BM  . amLODipine  5 mg Oral QHS  . aspirin EC  81 mg Oral QHS  . atorvastatin  40 mg Oral Daily  . Chlorhexidine Gluconate Cloth  6 each Topical Q0600  . clopidogrel  75 mg Oral QPM  . doxercalciferol  1.5 mcg Intravenous Q M,W,F-HD  . feeding supplement (NEPRO CARB STEADY)  237 mL Oral Q24H  . gabapentin  300 mg Oral QHS  . heparin  5,000 Units Subcutaneous Q8H  . insulin aspart  0-6 Units Subcutaneous TID WC  . insulin glargine  10 Units Subcutaneous Daily  . metoprolol succinate  50 mg Oral Daily  . metroNIDAZOLE  500 mg Oral Q8H  . multivitamin  1 tablet Oral QHS  . polyethylene glycol  17 g Oral Daily  . senna-docusate  1 tablet Oral BID  . sevelamer carbonate  2,400 mg Oral TID WC  . sodium chloride flush  3 mL Intravenous Q12H   Continuous Infusions: . sodium chloride    . cefTRIAXone (ROCEPHIN)  IV 2 g (08/10/20 2145)  . [START ON 08/13/2020] vancomycin    . [START ON 08/12/2020] vancomycin      Consultants:see note  Procedures:see note  Antimicrobials: Anti-infectives (From admission,  onward)   Start     Dose/Rate Route Frequency Ordered Stop   08/13/20 0000  vancomycin (VANCOCIN) IVPB 1000 mg/200 mL premix        1,000 mg 200 mL/hr over 60 Minutes Intravenous Every M-W-F (Hemodialysis) 08/10/20 1333     08/12/20 1200  vancomycin (VANCOCIN) IVPB 1000 mg/200 mL premix        1,000 mg 200 mL/hr over 60 Minutes Intravenous Every T-Th-Sa (Hemodialysis) 08/10/20 1333     08/09/20 2200  cefTRIAXone (ROCEPHIN) 2 g in sodium chloride 0.9 % 100 mL IVPB        2 g 200 mL/hr over 30 Minutes Intravenous Every 24 hours 08/09/20 0054     08/09/20 1200  vancomycin (VANCOREADY) IVPB 1000 mg/200 mL  Status:  Discontinued        1,000 mg 200 mL/hr over 60 Minutes Intravenous Every M-W-F (Hemodialysis) 08/08/20 2217 08/09/20 0812   08/09/20 1200  ceFEPIme (MAXIPIME) 2 g in sodium chloride 0.9 % 100 mL IVPB  Status:  Discontinued        2 g  200 mL/hr over 30 Minutes Intravenous Every M-W-F (Hemodialysis) 08/08/20 2217 08/09/20 0054   08/09/20 1200  vancomycin (VANCOCIN) IVPB 1000 mg/200 mL premix  Status:  Discontinued        1,000 mg 200 mL/hr over 60 Minutes Intravenous Every M-W-F (Hemodialysis) 08/09/20 0812 08/10/20 1333   08/09/20 0100  metroNIDAZOLE (FLAGYL) tablet 500 mg        500 mg Oral Every 8 hours 08/09/20 0054     08/08/20 2200  vancomycin (VANCOREADY) IVPB 2000 mg/400 mL        2,000 mg 200 mL/hr over 120 Minutes Intravenous  Once 08/08/20 2145 08/09/20 0143   08/08/20 2200  ceFEPIme (MAXIPIME) 2 g in sodium chloride 0.9 % 100 mL IVPB        2 g 200 mL/hr over 30 Minutes Intravenous  Once 08/08/20 2145 08/08/20 2333   08/08/20 2145  vancomycin (VANCOREADY) IVPB 1000 mg/200 mL  Status:  Discontinued        1,000 mg 200 mL/hr over 60 Minutes Intravenous  Once 08/08/20 2136 08/08/20 2145     Culture/Microbiology    Component Value Date/Time   SDES BLOOD SITE NOT SPECIFIED 08/08/2020 2044   SPECREQUEST  08/08/2020 2044    BOTTLES DRAWN AEROBIC AND ANAEROBIC Blood  Culture results may not be optimal due to an inadequate volume of blood received in culture bottles   CULT  08/08/2020 2044    NO GROWTH 3 DAYS Performed at [facility], [place] [address] [place], [place] [address]    REPTSTATUS PENDING 08/08/2020 2044    Other culture-see note  Objective: Vitals: Today's Vitals   08/11/20 0845 08/11/20 0853 08/11/20 0913 08/11/20 1128  BP: (!) 129/53 (!) 121/53 136/78   Pulse: 70 68 72   Resp: '17 15 16   '$ Temp:  97.6 F (36.4 C) 98.2 F (36.8 C)   TempSrc:   Oral   SpO2: 96% 94% 100%   Weight:      Height:      PainSc:  0-No pain 0-No pain 5     Intake/Output Summary (Last 24 hours) at 08/11/2020 1132 Last data filed at 08/10/2020 1608 Gross per 24 hour  Intake 95 ml  Output --  Net 95 ml   Filed Weights   08/09/20 1210 08/09/20 1623 08/11/20 0400  Weight: 98.8 kg 96.8 kg 100 kg   Weight change: 1.2 kg  Intake/Output from previous day: 04/14 0701 - 04/15 0700 In: 95 [IV Piggyback:95] Out: -  Intake/Output this shift: No intake/output data recorded. Filed Weights   08/09/20 1210 08/09/20 1623 08/11/20 0400  Weight: 98.8 kg 96.8 kg 100 kg    Examination: General exam: AAOx3, pleasant, NAD, weak appearing. HEENT:Oral mucosa moist, Ear/Nose WNL grossly, dentition normal. Respiratory system: bilaterally clear,no wheezing or crackles,no use of accessory muscle Cardiovascular system: S1 & S2 +, No JVD,. Gastrointestinal system: Abdomen soft, NT,ND, BS+ Nervous System:Alert, awake, moving extremities and grossly nonfocal Extremities: No edema, recent left BKA, right foot TMA with dressing in place Skin: No rashes,no icterus. MSK: Normal muscle bulk,tone, power  Data Reviewed: I have personally reviewed following labs and imaging studies CBC: Recent Labs  Lab 08/08/20 1855 08/09/20 0348 08/10/20 0355 08/11/20 0245  WBC 18.3* 16.9* 16.5* 15.5*  NEUTROABS 14.3*  --   --   --   HGB 9.2* 8.6* 8.9* 8.1*  HCT 31.3* 29.1*  29.7* 27.0*  MCV 84.6 82.9 82.5 82.3  PLT 894* 782* 762* 751*  Basic Metabolic Panel: Recent Labs  Lab 08/08/20 1855 08/09/20 0348 08/10/20 0355 08/11/20 0245  NA 136 135 135 132*  K 4.7 4.3 3.8 3.9  CL 96* 98 97* 96*  CO2 '26 25 28 26  '$ GLUCOSE 166* 93 114* 149*  BUN '20 22 15 '$ 32*  CREATININE 7.67* 8.25* 5.23* 7.01*  CALCIUM 8.9 8.7* 8.7* 8.5*  MG 1.9  --   --   --    GFR: Estimated Creatinine Clearance: 9.3 mL/min (A) (by C-G formula based on SCr of 7.01 mg/dL (H)). Liver Function Tests: Recent Labs  Lab 08/08/20 1855  AST 10*  ALT 7  ALKPHOS 63  BILITOT 0.6  PROT 8.0  ALBUMIN 2.6*   No results for input(s): LIPASE, AMYLASE in the last 168 hours. No results for input(s): AMMONIA in the last 168 hours. Coagulation Profile: Recent Labs  Lab 08/10/20 0355  INR 1.2   Cardiac Enzymes: No results for input(s): CKTOTAL, CKMB, CKMBINDEX, TROPONINI in the last 168 hours. BNP (last 3 results) No results for input(s): PROBNP in the last 8760 hours. HbA1C: Recent Labs    08/09/20 0348 08/09/20 0523  HGBA1C 8.8* 6.5*   CBG: Recent Labs  Lab 08/10/20 1207 08/10/20 1702 08/10/20 2147 08/11/20 0610 08/11/20 0829  GLUCAP 100* 110* 174* 136* 126*   Lipid Profile: No results for input(s): CHOL, HDL, LDLCALC, TRIG, CHOLHDL, LDLDIRECT in the last 72 hours. Thyroid Function Tests: No results for input(s): TSH, T4TOTAL, FREET4, T3FREE, THYROIDAB in the last 72 hours. Anemia Panel: No results for input(s): VITAMINB12, FOLATE, FERRITIN, TIBC, IRON, RETICCTPCT in the last 72 hours. Sepsis Labs: Recent Labs  Lab 08/08/20 1855  LATICACIDVEN 1.4    Recent Results (from the past 240 hour(s))  Blood culture (routine x 2)     Status: None (Preliminary result)   Collection Time: 08/08/20  8:44 PM   Specimen: BLOOD  Result Value Ref Range Status   Specimen Description BLOOD SITE NOT SPECIFIED  Final   Special Requests   Final    BOTTLES DRAWN AEROBIC AND ANAEROBIC  Blood Culture results may not be optimal due to an inadequate volume of blood received in culture bottles   Culture   Final    NO GROWTH 3 DAYS Performed at [facility], [place] [address], [place], [place] [address]    Report Status PENDING  Incomplete  SARS CORONAVIRUS 2 (TAT 6-24 HRS) Nasopharyngeal Nasopharyngeal Swab     Status: None   Collection Time: 08/08/20 10:31 PM   Specimen: Nasopharyngeal Swab  Result Value Ref Range Status   SARS Coronavirus 2 NEGATIVE NEGATIVE Final    Comment: (NOTE) SARS-CoV-2 target nucleic acids are NOT DETECTED.  The SARS-CoV-2 RNA is generally detectable in upper and lower respiratory specimens during the acute phase of infection. Negative results do not preclude SARS-CoV-2 infection, do not rule out co-infections with other pathogens, and should not be used as the sole basis for treatment or other patient management decisions. Negative results must be combined with clinical observations, patient history, and epidemiological information. The expected result is Negative.  Fact Sheet for Patients: SugarRoll.be  Fact Sheet for Healthcare Providers: https://www.woods-mathews.com/  This test is not yet approved or cleared by the [place] FDA and  has been authorized for detection and/or diagnosis of SARS-CoV-2 by FDA under an Emergency Use Authorization (EUA). This EUA will remain  in effect (meaning this test can be used) for the duration of the COVID-19 declaration under Se ction 564(b)(1)  of the Act, 21 U.S.C. section 360bbb-3(b)(1), unless the authorization is terminated or revoked sooner.  Performed at [facility], [place] [address], [place], [place] [address]   Surgical pcr screen     Status: None   Collection Time: 08/10/20  9:59 PM   Specimen: Nasal Mucosa; Nasal Swab  Result Value Ref Range Status   MRSA, PCR NEGATIVE NEGATIVE Final   Staphylococcus aureus NEGATIVE NEGATIVE Final     Comment: (NOTE) The Xpert SA Assay (FDA approved for NASAL specimens in patients 85 years of age and older), is one component of a comprehensive surveillance program. It is not intended to diagnose infection nor to guide or monitor treatment. Performed at [facility], [place] [address], [place], [place] [address]      Radiology Studies: PERIPHERAL VASCULAR CATHETERIZATION  Result Date: 08/10/2020 Patient name: BREUNNA LEINO         MRN: [id number]        DOB: 03/01/1956          Sex: female  08/10/2020 Pre-operative Diagnosis: Critical limb ischemia of the right lower extremity with tissue loss Post-operative diagnosis:  Same Surgeon:  [name], MD Procedure Performed: 1.  Ultrasound-guided access of left common femoral artery 2.  Aortogram including catheter selection of aorta 3.  Right lower extremity arteriogram with selection of third order branches 4.  Right anterior tibial artery angioplasty (3 mm x 220 mm Sterling) 5.  Mynx closure of the left common femoral artery 6.  44 minutes of monitored moderate conscious sedation time  Indications: Patient is a 65 year old female with multiple medical comorbidities including diabetes and end-stage renal disease who presents for right lower extremity arteriogram and intervention given history of PAD with known tibial disease and right foot tissue loss.  She presents today after risk benefits discussed.  Findings:  Aortogram showed approximate 50% stenosis of right renal artery while the left renal artery appears widely patent (she is ESRD).  Otherwise the aortoiliac segment was widely patent with no flow limiting stenosis.  Right lower extremity arteriogram showed a patent common femoral, profunda, SFA, above and below-knee popliteal artery.  Patient has severe tibial disease.  She has single-vessel runoff via an anterior tibial that had recurrent high-grade stenosis with multiple focal lesions 50 to 70% stenosis throughout the  vessel with flow into the dorsalis pedis in the foot.  Peroneal and posterior tibial arteries appeared occluded.  Ultimately the right anterior tibial artery was crossed into the foot and the entire anterior tibial artery was angioplastied with a 3 mm Sterling to nominal pressure for 2 minutes.  Excellent results.  No residual stenosis.  Preserved runoff down the anterior tibial with filling of the dorsalis pedis.             Procedure:  The patient was identified in the holding area and taken to room 8.  The patient was then placed supine on the table and prepped and draped in the usual sterile fashion.  A time out was called.  Ultrasound was used to evaluate the left common femoral artery.  It was patent .  A digital ultrasound image was acquired.  A micropuncture needle was used to access the left common femoral artery under ultrasound guidance.  An 018 wire was advanced without resistance and a micropuncture sheath was placed.  The 018 wire was removed and a benson wire was placed.  The micropuncture sheath was exchanged for a 5 french sheath.  An  omniflush catheter was advanced over the wire to the level of L-1.  An abdominal angiogram was obtained.  Next, using the omniflush catheter and a benson wire, the aortic bifurcation was crossed and the catheter was placed into theright external iliac artery and right runoff was obtained.  After evaluating images elected to intervene on her right anterior tibial artery where she had recurrent disease.  Ultimately used a Rosen wire down the right SFA and exchanged for a 5 [place] Ansell sheath in the left groin over the aortic bifurcation.  Patient was given 100 units/kg herparinIV.  I then used a CXI catheter with a V 18 and I was able to cross the SFA popliteal into the anterior tibial and got my wire into the foot where I confirmed on hand-injection that I was in the true lumen.  I then used a 3 mm x 220 mm Sterling and the entire anterior tibial was angioplastied to  nominal pressure for 2 minutes.  No residual disease with good runoff into the foot.  That point time wires and catheters were removed exchanged for a short 5 French sheath in the left groin.  A mynx closure device was deployed.   Plan: Patient will be posted for right TMA tomorrow with Dr. Donzetta Matters.  This will be very high risk given the right foot plantar surface soft tissue is marginal.  She is not optimized from a vascular surgery standpoint.  [name], MD [facility] Office: [phone]    LOS: 3 days   Antonieta Pert, MD [facility]  08/11/2020, 11:32 AM

## 2020-08-11 NOTE — Op Note (Signed)
    Patient name: Christy Zhang MRN: [id number] DOB: 09-Nov-1955 Sex: female  08/11/2020 Pre-operative Diagnosis: Gangrene right toes Post-operative diagnosis:  Same Surgeon:  Erlene Quan C. Donzetta Matters, MD Assistant: Risa Grill, PA Procedure Performed:   Open right transmetatarsal amputation  Indications: 65 year old female with recent revascularization right lower extremity.  She is now indicated for transmetatarsal amputation.  Assistant was necessary to expedite the case and for assistance with retraction  Findings: The dorsum of the foot was healthy as were the bones.  The plantar surface of the foot there was necrotic fat tracking back to the calcaneus.  There was no suitable flap for closure.  The wound was packed open  Patient will more than likely require transtibial amputation on the right   Procedure:  The patient was identified in the holding area and taken to the operating room where she is placed supine on the operative and MAC anesthesia induced.  Preoperative block of been placed.  Timeout was called and she was sterilely prepped and draped in the right lower extremity.  The block was checked noted to be intact.  [place] type incision was made distally on the foot incorporating all of the externally visible wound.  We dissected down to the bones from both sides.  The bones were transected with power saw with a dorsal to plantar bevel.  The all of the toes were then removed.  Unfortunately there was significant fat necrosis on the plantar aspect.  We transected this back as far as to be even with the dorsum of the foot.  There was good bleeding from the dorsum of the bone all appeared healthy.  Unfortunately fat necrosis tract on the plantar aspect back to the calcaneus.  This was all debrided.  We obtain hemostasis.  We thoroughly irrigated the wound.  This was not going to be closable and the plantar aspect is likely not viable.  This was packed with Betadine soaked Kerlix.  She did  tolerate procedure well without any complication peer all counts were correct to completion.  EBL: 50 cc   Christy Zhang C. Donzetta Matters, MD [facility] Office: [phone] Pager: [phone]

## 2020-08-12 DIAGNOSIS — M86471 Chronic osteomyelitis with draining sinus, right ankle and foot: Secondary | ICD-10-CM | POA: Diagnosis not present

## 2020-08-12 LAB — RENAL FUNCTION PANEL
Albumin: 2.1 g/dL — ABNORMAL LOW (ref 3.5–5.0)
Anion gap: 13 (ref 5–15)
BUN: 53 mg/dL — ABNORMAL HIGH (ref 8–23)
CO2: 23 mmol/L (ref 22–32)
Calcium: 8.9 mg/dL (ref 8.9–10.3)
Chloride: 97 mmol/L — ABNORMAL LOW (ref 98–111)
Creatinine, Ser: 8.76 mg/dL — ABNORMAL HIGH (ref 0.44–1.00)
GFR, Estimated: 5 mL/min — ABNORMAL LOW (ref >60)
Glucose, Bld: 237 mg/dL — ABNORMAL HIGH (ref 70–99)
Phosphorus: 5.2 mg/dL — ABNORMAL HIGH (ref 2.5–4.6)
Potassium: 4.2 mmol/L (ref 3.5–5.1)
Sodium: 133 mmol/L — ABNORMAL LOW (ref 135–145)

## 2020-08-12 LAB — CBC
HCT: 26.1 % — ABNORMAL LOW (ref 36.0–46.0)
Hemoglobin: 7.6 g/dL — ABNORMAL LOW (ref 12.0–15.0)
MCH: 24.7 pg — ABNORMAL LOW (ref 26.0–34.0)
MCHC: 29.1 g/dL — ABNORMAL LOW (ref 30.0–36.0)
MCV: 84.7 fL (ref 80.0–100.0)
Platelets: 722 10*3/uL — ABNORMAL HIGH (ref 150–400)
RBC: 3.08 MIL/uL — ABNORMAL LOW (ref 3.87–5.11)
RDW: 18.1 % — ABNORMAL HIGH (ref 11.5–15.5)
WBC: 24.5 10*3/uL — ABNORMAL HIGH (ref 4.0–10.5)
nRBC: 0 % (ref 0.0–0.2)

## 2020-08-12 LAB — GLUCOSE, CAPILLARY
Glucose-Capillary: 232 mg/dL — ABNORMAL HIGH (ref 70–99)
Glucose-Capillary: 187 mg/dL — ABNORMAL HIGH (ref 70–99)
Glucose-Capillary: 168 mg/dL — ABNORMAL HIGH (ref 70–99)

## 2020-08-12 MED ORDER — DOXERCALCIFEROL 4 MCG/2ML IV SOLN
INTRAVENOUS | Status: AC
  Administered 2020-08-12: 1.5 ug via INTRAVENOUS
  Filled 2020-08-12: qty 2

## 2020-08-12 MED ORDER — OXYCODONE HCL 5 MG PO TABS
ORAL_TABLET | ORAL | Status: AC
  Filled 2020-08-12: qty 1

## 2020-08-12 MED ORDER — VANCOMYCIN HCL IN DEXTROSE 1-5 GM/200ML-% IV SOLN
INTRAVENOUS | Status: AC
  Administered 2020-08-12: 1000 mg via INTRAVENOUS
  Filled 2020-08-12: qty 200

## 2020-08-12 NOTE — Progress Notes (Signed)
[name] Progress Note   Dialysis Orders: [facility] MWF 4hrs 2K 2.5Ca EDW 97 kg RU AVG Heparin 1000 load and 1000 units qhr EPO 14000 units qHD Venofer '100mg'$  qHD Hectorol 1.86mg qHD  Assessment/ Plan:   1. 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 CCarlis AbbottVVS w/ successful PTA of rt AT. -Underwent right transmetatarsal amputation with Dr. CDonzetta Matterson 4/15.  No complications  2.  ESRD:  MWF outpatient schedulevia RUA AVG-  Dialysis today.  Then resume her MWF schedule  3. HTN/ volume: B Restart home meds (usually on metoprolol and norvasc) and UF on RRT will help as well.  4. DM2- peradmitting team. 5. Anemiaof ESRD:  Aranesp  200 qFriday and not candidate for IV iron with active infection. Transfuse as needed as she may not respond to ESA with an active infection. 6. MBDof ESRD-Continue renvela and hectorol.    Subjective:   Patient underwent right transmetatarsal amputation yesterday. Pain controlled per patient. No complaints.   Objective:   BP (!) 150/51 (BP Location: Left Arm)   Pulse 74   Temp 97.9 F (36.6 C) (Oral)   Resp 16   Ht '5\' 3"'$  (1.6 m)   Wt 100 kg   LMP  (LMP Unknown)   SpO2 98%   BMI 39.05 kg/m   Intake/Output Summary (Last 24 hours) at 08/12/2020 0845 Last data filed at 08/11/2020 1600 Gross per 24 hour  Intake 700 ml  Output --  Net 700 ml   Weight change: 0 kg  Physical Exam: GEN: NAD, A&Ox3, NCAT HEENT: No conjunctival pallor, EOMI NECK: Supple, no thyromegaly LUNGS: Bilateral chest rise with no increased work of breathing CV: RRR, No M/R/G ABD: SNDNT +BS  EXT: Lt BKA and R foot in bandage ACCESS: RUA AVG  good bruit    Imaging: No results found.  Labs: BMET Recent Labs  Lab 08/08/20 1855 08/09/20 0348 08/10/20 0355 08/11/20 0245  NA 136 135 135 132*  K 4.7 4.3 3.8 3.9  CL 96* 98 97* 96*  CO2 '26 25 28 26  '$ GLUCOSE 166* 93 114* 149*  BUN '20 22 15 '$ 32*  CREATININE 7.67* 8.25*  5.23* 7.01*  CALCIUM 8.9 8.7* 8.7* 8.5*   CBC Recent Labs  Lab 08/08/20 1855 08/09/20 0348 08/10/20 0355 08/11/20 0245  WBC 18.3* 16.9* 16.5* 15.5*  NEUTROABS 14.3*  --   --   --   HGB 9.2* 8.6* 8.9* 8.1*  HCT 31.3* 29.1* 29.7* 27.0*  MCV 84.6 82.9 82.5 82.3  PLT 894* 782* 762* 751*    Medications:    . (feeding supplement) PROSource Plus  30 mL Oral BID BM  . amLODipine  5 mg Oral QHS  . aspirin EC  81 mg Oral QHS  . atorvastatin  40 mg Oral Daily  . Chlorhexidine Gluconate Cloth  6 each Topical Q0600  . clopidogrel  75 mg Oral QPM  . doxercalciferol  1.5 mcg Intravenous Q M,W,F-HD  . feeding supplement (NEPRO CARB STEADY)  237 mL Oral Q24H  . gabapentin  300 mg Oral QHS  . heparin  5,000 Units Subcutaneous Q8H  . insulin aspart  0-6 Units Subcutaneous TID WC  . insulin glargine  10 Units Subcutaneous Daily  . metoprolol succinate  50 mg Oral Daily  . metroNIDAZOLE  500 mg Oral Q8H  . multivitamin  1 tablet Oral QHS  . oxyCODONE      . polyethylene glycol  17 g Oral  Daily  . senna-docusate  1 tablet Oral BID  . sevelamer carbonate  2,400 mg Oral TID WC  . sodium chloride flush  3 mL Intravenous Q12H     Christy Zhang  08/12/2020, 8:45 AM

## 2020-08-12 NOTE — Progress Notes (Signed)
  Progress Note    08/12/2020 9:36 AM 1 Day Post-Op  Subjective: No current complaints  Vitals:   08/12/20 0900 08/12/20 0930  BP: (!) 138/50 (!) 150/53  Pulse: 75 78  Resp: 18 (!) 22  Temp:    SpO2: 97% 100%    Physical Exam: Patient evaluated on dialysis Right foot dressing clean dry intact  CBC    Component Value Date/Time   WBC 24.5 (H) 08/12/2020 0853   RBC 3.08 (L) 08/12/2020 0853   HGB 7.6 (L) 08/12/2020 0853   HGB 9.2 (L) 07/20/2020 1438   HCT 26.1 (L) 08/12/2020 0853   HCT 31.5 (L) 07/20/2020 1438   PLT 722 (H) 08/12/2020 0853   PLT 716 (H) 07/20/2020 1438   MCV 84.7 08/12/2020 0853   MCV 85 07/20/2020 1438   MCH 24.7 (L) 08/12/2020 0853   MCHC 29.1 (L) 08/12/2020 0853   RDW 18.1 (H) 08/12/2020 0853   RDW 16.1 (H) 07/20/2020 1438   LYMPHSABS 2.0 08/08/2020 1855   LYMPHSABS 2.0 07/20/2020 1438   MONOABS 1.1 (H) 08/08/2020 1855   EOSABS 0.7 (H) 08/08/2020 1855   EOSABS 0.5 (H) 07/20/2020 1438   BASOSABS 0.1 08/08/2020 1855   BASOSABS 0.1 07/20/2020 1438    BMET    Component Value Date/Time   NA 133 (L) 08/12/2020 0852   NA 139 07/20/2020 1438   K 4.2 08/12/2020 0852   CL 97 (L) 08/12/2020 0852   CO2 23 08/12/2020 0852   GLUCOSE 237 (H) 08/12/2020 0852   BUN 53 (H) 08/12/2020 0852   BUN 24 07/20/2020 1438   CREATININE 8.76 (H) 08/12/2020 0852   CALCIUM 8.9 08/12/2020 0852   GFRNONAA 5 (L) 08/12/2020 0852   GFRAA 8 (L) 04/06/2020 1113    INR    Component Value Date/Time   INR 1.2 08/10/2020 0355     Intake/Output Summary (Last 24 hours) at 08/12/2020 0936 Last data filed at 08/11/2020 1600 Gross per 24 hour  Intake 700 ml  Output --  Net 700 ml     Assessment/plan:  65 y.o. female is s/p open right transmetatarsal amputation.  Dressing was left in place due to patient being on dialysis, we will have nursing change today at bedside when she returned to the floor.  I will evaluate tomorrow whether patient will have option for healing  or will need below-knee amputation during this hospitalization.  This was communicated to the patient and she demonstrates good understanding.    Arnell Mausolf C. Donzetta Matters, MD [facility] Office: [phone] Pager: [phone]  08/12/2020 9:36 AM

## 2020-08-12 NOTE — Progress Notes (Addendum)
PROGRESS NOTE    Christy Zhang  WYO:[id number] DOB: [birth year] DOA: 08/08/2020 PCP: Claretta Fraise, MD   Chief Complaint  Patient presents with  . Foot Pain  Brief Narrative: 65 year old female with PMH of Chronic Hypertension, Uncontrolled Diabetes, ESRD on HD MWF, and Peripheral Vascular Disease s/p Left BKA who presents to the ED on 4/13 with worsening RLE wounds with necrotic tissue while on Cephalex.   Xray showed evidence of osteomyelitis.  ESR is 92 and CRP is 17.  On 4/14, angiogram showed critical limb ischemia.  On 4/15 Vascular Dr. Donzetta Matters performed open transmetatarsal amputation.  Vascular Surgery recommends continued close monitoring.  Patient is high risk of more proximal amputation.  Subjective: Patient was evaluated during dialysis. She is pleasant. Denies any chest pain or shortness of breath. Wounds are stable.  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  Patient may need right BKA.  Vascular is following, appreciate. - Follow up on wound cultures. - On IV Vancomycin, Ceftriaxone, and Flagyl. - WBC increased to 24K.  Check blood cultures. - Consult Infectious Disease, appreciate assistance and recommendations.  Peripheral Vascular Disease s/p Left BKA: - Continue Aspirin and Statin. - Continue local wound care.  ESRD and HD MWF: Anemia of CKD: Hyperparathyroidism: - Nephrology is following, appreciate.  Diabetes Mellitus Type 2 on long term insulin: A1C is 8.8%. - Glucose continues to fluctuate. - Continue Lantus 10 units and Lispro SSi with POC glucose q6hrs. - If glucose remains elevated tomorrow, I will increase Lantus and start pre-meal insulin.  Hold off for now as patient is in dialysis and drops easily.  Essential (primary) hypertension:  - BP is slightly high. - Continue Metoprolol and Amlodipine. - Hold off on increasing dose today as patient is in dialysis.   Reassess tomorrow.  Morbid obesity with BMI 38 kg/m2: - Counseled on weight loss through diet and lifestyle modification.  Diet Order            Diet renal/carb modified with fluid restriction Diet-HS Snack? Nothing; Fluid restriction: 1200 mL Fluid; Room service appropriate? Yes; Fluid consistency: Thin  Diet effective now                Patient's Body mass index is 39.05 kg/m. DVT prophylaxis: heparin injection 5,000 Units Start: 08/09/20 1400 Code Status:   Code Status: Full Code  Family Communication: plan of care discussed with patient at bedside.  Status is: Inpatient Remains inpatient appropriate because:IV treatments appropriate due to intensity of illness or inability to take PO and Inpatient level of care appropriate due to severity of illness remains hospitalized for ongoing management of gangrenous right foot.  Dispo: The patient is from: Home              Anticipated d/c is to: TBD, anticipate hospitalization through the weekend- early next week.              Patient currently is not medically stable to d/c.   Difficult to place patient No   Unresulted Labs (From admission, onward)         None    Medications reviewed:  Scheduled Meds: . (feeding supplement) PROSource Plus  30 mL Oral BID BM  . amLODipine  5 mg Oral QHS  . aspirin EC  81 mg Oral QHS  . atorvastatin  40 mg Oral Daily  . Chlorhexidine Gluconate Cloth  6 each Topical Q0600  . clopidogrel  75 mg Oral QPM  . doxercalciferol  1.5 mcg Intravenous Q M,W,F-HD  . feeding supplement (NEPRO CARB STEADY)  237 mL Oral Q24H  . gabapentin  300 mg Oral QHS  . heparin  5,000 Units Subcutaneous Q8H  . insulin aspart  0-6 Units Subcutaneous TID WC  . insulin glargine  10 Units Subcutaneous Daily  . metoprolol succinate  50 mg Oral Daily  . metroNIDAZOLE  500 mg Oral Q8H  . multivitamin  1 tablet Oral QHS  . polyethylene glycol  17 g Oral Daily  . senna-docusate  1 tablet Oral BID  . sevelamer carbonate   2,400 mg Oral TID WC  . sodium chloride flush  3 mL Intravenous Q12H   Continuous Infusions: . sodium chloride    . cefTRIAXone (ROCEPHIN)  IV 2 g (08/11/20 2116)  . [START ON 08/13/2020] vancomycin    . vancomycin      Consultants:see note  Procedures:see note  Antimicrobials: Anti-infectives (From admission, onward)   Start     Dose/Rate Route Frequency Ordered Stop   08/13/20 0000  vancomycin (VANCOCIN) IVPB 1000 mg/200 mL premix        1,000 mg 200 mL/hr over 60 Minutes Intravenous Every M-W-F (Hemodialysis) 08/10/20 1333     08/12/20 1200  vancomycin (VANCOCIN) IVPB 1000 mg/200 mL premix  Status:  Discontinued        1,000 mg 200 mL/hr over 60 Minutes Intravenous Every T-Th-Sa (Hemodialysis) 08/10/20 1333 08/11/20 1319   08/12/20 1200  vancomycin (VANCOCIN) IVPB 1000 mg/200 mL premix        1,000 mg 200 mL/hr over 60 Minutes Intravenous Every Sat (Hemodialysis) 08/11/20 1319 08/19/20 1159   08/09/20 2200  cefTRIAXone (ROCEPHIN) 2 g in sodium chloride 0.9 % 100 mL IVPB        2 g 200 mL/hr over 30 Minutes Intravenous Every 24 hours 08/09/20 0054     08/09/20 1200  vancomycin (VANCOREADY) IVPB 1000 mg/200 mL  Status:  Discontinued        1,000 mg 200 mL/hr over 60 Minutes Intravenous Every M-W-F (Hemodialysis) 08/08/20 2217 08/09/20 0812   08/09/20 1200  ceFEPIme (MAXIPIME) 2 g in sodium chloride 0.9 % 100 mL IVPB  Status:  Discontinued        2 g 200 mL/hr over 30 Minutes Intravenous Every M-W-F (Hemodialysis) 08/08/20 2217 08/09/20 0054   08/09/20 1200  vancomycin (VANCOCIN) IVPB 1000 mg/200 mL premix  Status:  Discontinued        1,000 mg 200 mL/hr over 60 Minutes Intravenous Every M-W-F (Hemodialysis) 08/09/20 0812 08/10/20 1333   08/09/20 0100  metroNIDAZOLE (FLAGYL) tablet 500 mg        500 mg Oral Every 8 hours 08/09/20 0054     08/08/20 2200  vancomycin (VANCOREADY) IVPB 2000 mg/400 mL        2,000 mg 200 mL/hr over 120 Minutes Intravenous  Once 08/08/20 2145  08/09/20 0143   08/08/20 2200  ceFEPIme (MAXIPIME) 2 g in sodium chloride 0.9 % 100 mL IVPB        2 g 200 mL/hr over 30 Minutes Intravenous  Once 08/08/20 2145 08/08/20 2333   08/08/20 2145  vancomycin (VANCOREADY) IVPB 1000 mg/200 mL  Status:  Discontinued        1,000 mg 200 mL/hr over 60 Minutes Intravenous  Once 08/08/20 2136 08/08/20 2145     Culture/Microbiology    Component Value Date/Time   SDES BLOOD SITE NOT SPECIFIED 08/08/2020 2044  SPECREQUEST  08/08/2020 2044    BOTTLES DRAWN AEROBIC AND ANAEROBIC Blood Culture results may not be optimal due to an inadequate volume of blood received in culture bottles   CULT  08/08/2020 2044    NO GROWTH 3 DAYS Performed at [facility], [place] [address], [place], [place] [address]    REPTSTATUS PENDING 08/08/2020 2044    Other culture-see note  Objective: Vitals: Today's Vitals   08/11/20 2024 08/11/20 2335 08/12/20 0319 08/12/20 0740  BP: 139/60 (!) 145/66 (!) 156/66 (!) 150/51  Pulse: 83 89 92 74  Resp: $Remo'18 18 14 16  'alaSq$ Temp: 98.4 F (36.9 C) 98.6 F (37 C) 98.6 F (37 C) 97.9 F (36.6 C)  TempSrc: Oral Oral Oral Oral  SpO2: 98% 95% 98% 98%  Weight:   100 kg   Height:      PainSc:        Intake/Output Summary (Last 24 hours) at 08/12/2020 0804 Last data filed at 08/11/2020 1600 Gross per 24 hour  Intake 700 ml  Output --  Net 700 ml   Filed Weights   08/09/20 1623 08/11/20 0400 08/12/20 0319  Weight: 96.8 kg 100 kg 100 kg   Weight change: 0 kg  Intake/Output from previous day: 04/15 0701 - 04/16 0700 In: 700 [P.O.:700] Out: -  Intake/Output this shift: No intake/output data recorded. Filed Weights   08/09/20 1623 08/11/20 0400 08/12/20 0319  Weight: 96.8 kg 100 kg 100 kg    Examination: General exam: AAOx3, pleasant, NAD, weak appearing. HEENT:Oral mucosa moist, Ear/Nose WNL grossly, dentition normal. Respiratory system: bilaterally clear,no wheezing or crackles,no use of accessory  muscle Cardiovascular system: S1 & S2 +, No JVD,. Gastrointestinal system: Abdomen soft, NT,ND, BS+ Nervous System:Alert, awake, moving extremities and grossly nonfocal Extremities: No edema, recent left BKA, right foot TMA with dressing in place Skin: No rashes,no icterus. MSK: Normal muscle bulk,tone, power  Data Reviewed: I have personally reviewed following labs and imaging studies CBC: Recent Labs  Lab 08/08/20 1855 08/09/20 0348 08/10/20 0355 08/11/20 0245  WBC 18.3* 16.9* 16.5* 15.5*  NEUTROABS 14.3*  --   --   --   HGB 9.2* 8.6* 8.9* 8.1*  HCT 31.3* 29.1* 29.7* 27.0*  MCV 84.6 82.9 82.5 82.3  PLT 894* 782* 762* 979*   Basic Metabolic Panel: Recent Labs  Lab 08/08/20 1855 08/09/20 0348 08/10/20 0355 08/11/20 0245  NA 136 135 135 132*  K 4.7 4.3 3.8 3.9  CL 96* 98 97* 96*  CO2 $Re'26 25 28 26  'Nji$ GLUCOSE 166* 93 114* 149*  BUN $Re'20 22 15 'IRC$ 32*  CREATININE 7.67* 8.25* 5.23* 7.01*  CALCIUM 8.9 8.7* 8.7* 8.5*  MG 1.9  --   --   --    GFR: Estimated Creatinine Clearance: 9.3 mL/min (A) (by C-G formula based on SCr of 7.01 mg/dL (H)). Liver Function Tests: Recent Labs  Lab 08/08/20 1855  AST 10*  ALT 7  ALKPHOS 63  BILITOT 0.6  PROT 8.0  ALBUMIN 2.6*   No results for input(s): LIPASE, AMYLASE in the last 168 hours. No results for input(s): AMMONIA in the last 168 hours. Coagulation Profile: Recent Labs  Lab 08/10/20 0355  INR 1.2   Cardiac Enzymes: No results for input(s): CKTOTAL, CKMB, CKMBINDEX, TROPONINI in the last 168 hours. BNP (last 3 results) No results for input(s): PROBNP in the last 8760 hours. HbA1C: No results for input(s): HGBA1C in the last 72 hours. CBG: Recent Labs  Lab 08/11/20  2376 08/11/20 1253 08/11/20 1643 08/11/20 2026 08/12/20 0634  GLUCAP 126* 270* 235* 323* 232*   Lipid Profile: No results for input(s): CHOL, HDL, LDLCALC, TRIG, CHOLHDL, LDLDIRECT in the last 72 hours. Thyroid Function Tests: No results for input(s):  TSH, T4TOTAL, FREET4, T3FREE, THYROIDAB in the last 72 hours. Anemia Panel: No results for input(s): VITAMINB12, FOLATE, FERRITIN, TIBC, IRON, RETICCTPCT in the last 72 hours. Sepsis Labs: Recent Labs  Lab 08/08/20 1855  LATICACIDVEN 1.4    Recent Results (from the past 240 hour(s))  Blood culture (routine x 2)     Status: None (Preliminary result)   Collection Time: 08/08/20  8:44 PM   Specimen: BLOOD  Result Value Ref Range Status   Specimen Description BLOOD SITE NOT SPECIFIED  Final   Special Requests   Final    BOTTLES DRAWN AEROBIC AND ANAEROBIC Blood Culture results may not be optimal due to an inadequate volume of blood received in culture bottles   Culture   Final    NO GROWTH 3 DAYS Performed at [facility], [place] [address], [place], [place] [address]    Report Status PENDING  Incomplete  SARS CORONAVIRUS 2 (TAT 6-24 HRS) Nasopharyngeal Nasopharyngeal Swab     Status: None   Collection Time: 08/08/20 10:31 PM   Specimen: Nasopharyngeal Swab  Result Value Ref Range Status   SARS Coronavirus 2 NEGATIVE NEGATIVE Final    Comment: (NOTE) SARS-CoV-2 target nucleic acids are NOT DETECTED.  The SARS-CoV-2 RNA is generally detectable in upper and lower respiratory specimens during the acute phase of infection. Negative results do not preclude SARS-CoV-2 infection, do not rule out co-infections with other pathogens, and should not be used as the sole basis for treatment or other patient management decisions. Negative results must be combined with clinical observations, patient history, and epidemiological information. The expected result is Negative.  Fact Sheet for Patients: SugarRoll.be  Fact Sheet for Healthcare Providers: https://www.woods-mathews.com/  This test is not yet approved or cleared by the [place] FDA and  has been authorized for detection and/or diagnosis of SARS-CoV-2 by FDA under an Emergency Use  Authorization (EUA). This EUA will remain  in effect (meaning this test can be used) for the duration of the COVID-19 declaration under Se ction 564(b)(1) of the Act, 21 U.S.C. section 360bbb-3(b)(1), unless the authorization is terminated or revoked sooner.  Performed at [facility], [place] [address], [place], [place] [address]   Surgical pcr screen     Status: None   Collection Time: 08/10/20  9:59 PM   Specimen: Nasal Mucosa; Nasal Swab  Result Value Ref Range Status   MRSA, PCR NEGATIVE NEGATIVE Final   Staphylococcus aureus NEGATIVE NEGATIVE Final    Comment: (NOTE) The Xpert SA Assay (FDA approved for NASAL specimens in patients 52 years of age and older), is one component of a comprehensive surveillance program. It is not intended to diagnose infection nor to guide or monitor treatment. Performed at [facility], [place] [address] [place], [place] [address]      Radiology Studies: No results found.   LOS: 4 days   George Hugh, MD [facility]  08/12/2020, 8:04 AM

## 2020-08-12 NOTE — Progress Notes (Signed)
Mobility Specialist: Progress Note   08/12/20 1536  Mobility  Activity Dangled on edge of bed (Bed Exercises)  Level of Assistance Minimal assist, patient does 75% or more  Assistive Device None  Mobility Response Tolerated well  Mobility performed by Mobility specialist  $Mobility charge 1 Mobility   Post-Mobility: 84 HR, 153/59 BP  Pt c/o pain in her R foot pre-mobility, no rating given. Pt was minA to sit EOB from supine. Pt performed 3 sets of 10 leg extensions on each LE. Pt said the throbbing in her foot had went away after sitting up. Pt back to bed with bed alarm on.   [facility] Suhan Paci Mobility Specialist Mobility Specialist Phone: [phone]

## 2020-08-13 DIAGNOSIS — M86171 Other acute osteomyelitis, right ankle and foot: Secondary | ICD-10-CM | POA: Diagnosis not present

## 2020-08-13 DIAGNOSIS — E11628 Type 2 diabetes mellitus with other skin complications: Secondary | ICD-10-CM

## 2020-08-13 DIAGNOSIS — E1122 Type 2 diabetes mellitus with diabetic chronic kidney disease: Secondary | ICD-10-CM

## 2020-08-13 DIAGNOSIS — Z5181 Encounter for therapeutic drug level monitoring: Secondary | ICD-10-CM

## 2020-08-13 DIAGNOSIS — N186 End stage renal disease: Secondary | ICD-10-CM

## 2020-08-13 DIAGNOSIS — Z992 Dependence on renal dialysis: Secondary | ICD-10-CM

## 2020-08-13 DIAGNOSIS — I739 Peripheral vascular disease, unspecified: Secondary | ICD-10-CM

## 2020-08-13 DIAGNOSIS — L089 Local infection of the skin and subcutaneous tissue, unspecified: Secondary | ICD-10-CM

## 2020-08-13 LAB — CBC WITH DIFFERENTIAL/PLATELET
Abs Immature Granulocytes: 0.14 10*3/uL — ABNORMAL HIGH (ref 0.00–0.07)
Basophils Absolute: 0.1 10*3/uL (ref 0.0–0.1)
Basophils Relative: 1 %
Eosinophils Absolute: 0.5 10*3/uL (ref 0.0–0.5)
Eosinophils Relative: 2 %
HCT: 24.3 % — ABNORMAL LOW (ref 36.0–46.0)
Hemoglobin: 7.4 g/dL — ABNORMAL LOW (ref 12.0–15.0)
Immature Granulocytes: 1 %
Lymphocytes Relative: 14 %
Lymphs Abs: 2.8 10*3/uL (ref 0.7–4.0)
MCH: 24.9 pg — ABNORMAL LOW (ref 26.0–34.0)
MCHC: 30.5 g/dL (ref 30.0–36.0)
MCV: 81.8 fL (ref 80.0–100.0)
Monocytes Absolute: 1.1 10*3/uL — ABNORMAL HIGH (ref 0.1–1.0)
Monocytes Relative: 5 %
Neutro Abs: 15.9 10*3/uL — ABNORMAL HIGH (ref 1.7–7.7)
Neutrophils Relative %: 77 %
Platelets: 653 10*3/uL — ABNORMAL HIGH (ref 150–400)
RBC: 2.97 MIL/uL — ABNORMAL LOW (ref 3.87–5.11)
RDW: 17.6 % — ABNORMAL HIGH (ref 11.5–15.5)
WBC: 20.5 10*3/uL — ABNORMAL HIGH (ref 4.0–10.5)
nRBC: 0 % (ref 0.0–0.2)

## 2020-08-13 LAB — BASIC METABOLIC PANEL
Anion gap: 11 (ref 5–15)
BUN: 26 mg/dL — ABNORMAL HIGH (ref 8–23)
CO2: 29 mmol/L (ref 22–32)
Calcium: 8.5 mg/dL — ABNORMAL LOW (ref 8.9–10.3)
Chloride: 93 mmol/L — ABNORMAL LOW (ref 98–111)
Creatinine, Ser: 5.42 mg/dL — ABNORMAL HIGH (ref 0.44–1.00)
GFR, Estimated: 8 mL/min — ABNORMAL LOW (ref >60)
Glucose, Bld: 152 mg/dL — ABNORMAL HIGH (ref 70–99)
Potassium: 3.6 mmol/L (ref 3.5–5.1)
Sodium: 133 mmol/L — ABNORMAL LOW (ref 135–145)

## 2020-08-13 LAB — CULTURE, BLOOD (ROUTINE X 2): Culture: NO GROWTH

## 2020-08-13 LAB — GLUCOSE, CAPILLARY
Glucose-Capillary: 107 mg/dL — ABNORMAL HIGH (ref 70–99)
Glucose-Capillary: 130 mg/dL — ABNORMAL HIGH (ref 70–99)
Glucose-Capillary: 144 mg/dL — ABNORMAL HIGH (ref 70–99)
Glucose-Capillary: 116 mg/dL — ABNORMAL HIGH (ref 70–99)

## 2020-08-13 NOTE — Progress Notes (Signed)
Mobility Specialist: Progress Note   08/13/20 1427  Mobility  Activity Dangled on edge of bed  Level of Assistance Minimal assist, patient does 75% or more  Assistive Device None  Mobility Response Tolerated well  Mobility performed by Mobility specialist  $Mobility charge 1 Mobility   Pre-Mobility: 76 HR Post-Mobility: 67 HR, 153/59 BP  Pt sat EOB and performed LE exercises. Pt asx during session today. Pt back to bed after session with call bell at her side.   [facility] Tamanika Heiney Mobility Specialist Mobility Specialist Phone: [phone]

## 2020-08-13 NOTE — Consult Note (Addendum)
[facility]ious [facility]                                                                                        Patient Identification: Patient Name: Christy Zhang MRN: [id number] [place] Date: 08/08/2020  6:17 PM Today's Date: 08/13/2020 Reason for consult: Osteomyelitis Requesting provider: George Hugh  Principal Problem:   Osteomyelitis of right foot [facility]) Active Problems:   ESRD (end stage renal disease) ([place])   Controlled type 2 diabetes mellitus with chronic kidney disease on chronic dialysis, with long-term current use of insulin ([place])   Essential (primary) hypertension   Thrombocytosis   Pressure injury of skin   Antibiotics: Vancomycin 4/12-c                    Cefepime 4/12, ceftriaxone 4/13 -c                      Metronidazole 4/12- c  Lines/Tubes: PIV, right upper arm AV graft  Assessment Acute Limb ischemia/Right foot gangrene/osteomyelitis s/p Right lower extremity arteriogram, right anterior tibial artery angioplasty on 08/10/20 followed by open right TMA on 4/15.  Per vascular patient will more than likely require a transtibial amputation on the right.   DM a1c 6.5 ESR on HD via Rt upper arm graft  PAD Medication monitoring   Recommendations  Continue current regimen of antibiotics ( Vancomycin/ceftriaxone and metronidazole) for now. No cultures available for narrowing down.  Fu blood cultures  Final antibiotic choice and duration will be based on whether patient will go for further proximal amputation by Vascular Monitor CBC CMP and vancomycin trough  New ID team will assume care from tomorrow  Rest of the management as per the primary team. Please call with questions or concerns.  Thank you for the consult   __________________________________________________________________________________________________________ HPI and Hospital Course: 65 year old female with past  medical history of ESRD on dialysis via right upper arm graft, DM, HTN PVD status post left BKA who presented to the ED on 4/12 with complaints of right foot pain,, redness and discoloration.  Patient had a left BKA 06/08/20 by Dr. Luan Pulling and has been following with vascular surgery.  2 weeks ago she was seen at the vascular clinic, she was thought to have dry gangrene of the right third toe and had a bedside debridement in the office.  She was prescribed cephalexin and completed course 2 days ago.  There was also purulent discharge while changing dressing at home.  Denies any fevers chills or sweats.  Denies any chest pain shortness of breath or cough. Denies nausea, vomiting, abdominal pain and diarrhea,   At ED, she was afebrile, WBC up to 18, lactic acid 1.4, platelets 894.  ESR 83. X-ray right foot with abnormal findings as below.   Seen by vascular and underwent right lower extremity arteriogram, right anterior tibial artery angioplasty on 08/10/20 followed by open right TMA on 4/15.  Per vascular patient will more than likely require a transtibial amputation on the right.   ROS: General- Denies fever, chills, loss of appetite and loss of weight HEENT - Denies  headache, blurry vision, neck pain, sinus pain Chest - Denies any chest pain, SOB or cough CVS- Denies any dizziness/lightheadedness, syncopal attacks, palpitations Abdomen- Denies any nausea, vomiting, abdominal pain, hematochezia and diarrhea Neuro - Denies any weakness, numbness, tingling sensation Psych - Denies any changes in mood irritability or depressive symptoms GU- Denies any burning, dysuria, hematuria or increased frequency of urination Skin - denies any rashes/lesions MSK - denies any joint pain/swelling or restricted ROM   Past Medical History:  Diagnosis Date  . Anemia   . Arthritis   . Asthma    in the past  . CHF (congestive heart failure) ([place])   . Chronic kidney disease    Renal failure, dialysis on M/W/F  .  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 End stage renal disease ([place])   . End stage renal disease on dialysis ([place])   . Gangrene ([place])   . Hemodialysis access, arteriovenous graft ([place])   . Hyperglycemia due to diabetes mellitus ([place]) 10/15/2019  . Hyperlipidemia   . Hypertension   . Neuromuscular disorder [facility])    Past Surgical History:  Procedure Laterality Date  .  LEFT BELOW KNEE AMPUTATION (Left Knee)  06/08/2020  . A/V FISTULAGRAM Right 02/04/2017   Procedure: A/V Fistulagram;  Surgeon: Waynetta Sandy, MD;  Location: [facility];  Service: Cardiovascular;  Laterality: Right;  . ABDOMINAL AORTOGRAM W/LOWER EXTREMITY N/A 11/18/2019   Procedure: ABDOMINAL AORTOGRAM W/LOWER EXTREMITY;  Surgeon: [name], MD;  Location: [place] [facility];  Service: Cardiovascular;  Laterality: N/A;  . ABDOMINAL AORTOGRAM W/LOWER EXTREMITY N/A 02/23/2020   Procedure: ABDOMINAL AORTOGRAM W/LOWER EXTREMITY;  Surgeon: [name], MD;  Location: [place] [facility];  Service: Cardiovascular;  Laterality: N/A;  Bilateral  . ABDOMINAL AORTOGRAM W/LOWER EXTREMITY N/A 08/10/2020   Procedure: ABDOMINAL AORTOGRAM W/LOWER EXTREMITY;  Surgeon: [name], MD;  Location: [place] [facility];  Service: Cardiovascular;  Laterality: N/A;  . AMPUTATION Left 06/08/2020   Procedure: LEFT BELOW KNEE AMPUTATION;  Surgeon: Cherre Robins, MD;  Location: [place];  Service: Vascular;  Laterality: Left;  . AV FISTULA PLACEMENT Right 08/25/2014   Procedure: RIGHT BRACHIOCEPHALIC ARTERIOVENOUS (AV) FISTULA CREATION;  Surgeon: Mal Misty, MD;  Location: [place];  Service: Vascular;  Laterality: Right;  . AV FISTULA PLACEMENT Right 03/18/2019   Procedure: INSERTION OF ARTERIOVENOUS (AV) GORE-TEX GRAFT RIGHT ARM;  Surgeon: Serafina Mitchell, MD;  Location: [place];  Service: Vascular;  Laterality: Right;  . BASCILIC VEIN TRANSPOSITION Right 12/24/2018   Procedure: FIRST STAGE  BASILIC VEIN TRANSPOSITION RIGHT UPPER ARM;  Surgeon: Serafina Mitchell, MD;  Location: [place];  Service: Vascular;  Laterality: Right;  . FISTULA SUPERFICIALIZATION Right 11/03/2014   Procedure: RIGHT UPPER ARM FISTULA SUPERFICIALIZATION;  Surgeon: Mal Misty, MD;  Location: [place];  Service: Vascular;  Laterality: Right;  . FISTULOGRAM Right 01/16/2016   Procedure: RIGHT UPPER EXTREMITY ARTERIOVENOUS FISTULOGRAM WITH BALLOON ANGIOPLASTY;  Surgeon: Vickie Epley, MD;  Location: [facility];  Service: Vascular;  Laterality: Right;  . INSERTION OF DIALYSIS CATHETER     X4  . IR FLUORO GUIDE CV LINE RIGHT  12/03/2018  . IR REMOVAL TUN CV CATH W/O FL  07/06/2019  . IR THROMBECTOMY AV FISTULA W/THROMBOLYSIS/PTA INC/SHUNT/IMG RIGHT Right 12/03/2018  . IR THROMBECTOMY AV FISTULA W/THROMBOLYSIS/PTA/STENT INC/SHUNT/IMG RT Right 10/11/2019  . IR US GUIDE VASC ACCESS RIGHT  12/03/2018  . IR US GUIDE  VASC ACCESS RIGHT  12/03/2018  . IR US GUIDE VASC ACCESS RIGHT  10/14/2019  . LIGATION OF COMPETING BRANCHES OF ARTERIOVENOUS FISTULA Right 11/03/2014   Procedure: LIGATION OF COMPETING BRANCHE x1 OF RIGHT UPPER ARM ARTERIOVENOUS FISTULA;  Surgeon: Mal Misty, MD;  Location: [place];  Service: Vascular;  Laterality: Right;  . PERIPHERAL VASCULAR BALLOON ANGIOPLASTY Right 02/04/2017   Procedure: PERIPHERAL VASCULAR BALLOON ANGIOPLASTY;  Surgeon: Waynetta Sandy, MD;  Location: [place] [facility];  Service: Cardiovascular;  Laterality: Right;  . PERIPHERAL VASCULAR BALLOON ANGIOPLASTY Right 11/18/2019   Procedure: PERIPHERAL VASCULAR BALLOON ANGIOPLASTY;  Surgeon: [name], MD;  Location: [place] [facility];  Service: Cardiovascular;  Laterality: Right;  anterior tibial  . PERIPHERAL VASCULAR BALLOON ANGIOPLASTY  02/23/2020   Procedure: PERIPHERAL VASCULAR BALLOON ANGIOPLASTY;  Surgeon: [name], MD;  Location: [place] [facility];  Service: Cardiovascular;;  Lt  AT  . PERIPHERAL VASCULAR  INTERVENTION Right 08/10/2020   Procedure: PERIPHERAL VASCULAR INTERVENTION;  Surgeon: [name], MD;  Location: [place] [facility];  Service: Cardiovascular;  Laterality: Right;  . TRANSMETATARSAL AMPUTATION Left 02/24/2020   Procedure: TRANSMETATARSAL AMPUTATION LEFT;  Surgeon: Cherre Robins, MD;  Location: [place];  Service: Vascular;  Laterality: Left;  . TUBAL LIGATION      Scheduled Meds: . (feeding supplement) PROSource Plus  30 mL Oral BID BM  . amLODipine  5 mg Oral QHS  . aspirin EC  81 mg Oral QHS  . atorvastatin  40 mg Oral Daily  . Chlorhexidine Gluconate Cloth  6 each Topical Q0600  . clopidogrel  75 mg Oral QPM  . doxercalciferol  1.5 mcg Intravenous Q M,W,F-HD  . feeding supplement (NEPRO CARB STEADY)  237 mL Oral Q24H  . gabapentin  300 mg Oral QHS  . heparin  5,000 Units Subcutaneous Q8H  . insulin aspart  0-6 Units Subcutaneous TID WC  . insulin glargine  10 Units Subcutaneous Daily  . metoprolol succinate  50 mg Oral Daily  . metroNIDAZOLE  500 mg Oral Q8H  . multivitamin  1 tablet Oral QHS  . polyethylene glycol  17 g Oral Daily  . senna-docusate  1 tablet Oral BID  . sevelamer carbonate  2,400 mg Oral TID WC  . sodium chloride flush  3 mL Intravenous Q12H   Continuous Infusions: . sodium chloride    . cefTRIAXone (ROCEPHIN)  IV 2 g (08/12/20 2038)  . vancomycin     PRN Meds:.sodium chloride, acetaminophen **OR** acetaminophen, diphenhydrAMINE, hydrALAZINE, HYDROmorphone (DILAUDID) injection, labetalol, ondansetron **OR** ondansetron (ZOFRAN) IV, oxyCODONE, sevelamer carbonate, sodium chloride flush  Allergies  Allergen Reactions  . Sulfa Antibiotics Swelling    Social History   Socioeconomic History  . Marital status: Widowed    Spouse name: Not on file  . Number of children: Not on file  . Years of education: Not on file  . Highest education level: Not on file  Occupational History  . Not on file  Tobacco Use  . Smoking status:  Former Smoker    Types: Cigarettes    Quit date: 07/25/1984    Years since quitting: 36.0  . Smokeless tobacco: Never Used  Vaping Use  . Vaping Use: Never used  Substance and Sexual Activity  . Alcohol use: No    Alcohol/week: 0.0 standard drinks  . Drug use: No  . Sexual activity: Not on file  Other Topics Concern  . Not on file  Social History Narrative  .  Not on file   Social Determinants of Health   Financial Resource Strain: Low Risk   . Difficulty of Paying Living Expenses: Not very hard  Food Insecurity: No Food Insecurity  . Worried About Charity fundraiser in the Last Year: Never true  . Ran Out of Food in the Last Year: Never true  Transportation Needs: No Transportation Needs  . Lack of Transportation (Medical): No  . Lack of Transportation (Non-Medical): No  Physical Activity: Inactive  . Days of Exercise per Week: 0 days  . Minutes of Exercise per Session: 0 min  Stress: No Stress Concern Present  . Feeling of Stress : Only a little  Social Connections: Socially Isolated  . Frequency of Communication with Friends and Family: More than three times a week  . Frequency of Social Gatherings with Friends and Family: More than three times a week  . Attends Religious Services: Never  . Active Member of Clubs or Organizations: No  . Attends Archivist Meetings: Never  . Marital Status: Widowed  Human resources officer Violence: Not on file   Family h/o Father- HTN , Mother HTN  Vitals  BP 129/67 (BP Location: Left Arm)   Pulse 66   Temp 98.1 F (36.7 C) (Oral)   Resp 19   Ht _0  (1.6 m)   Wt 99 kg   LMP  (LMP Unknown)   SpO2 96%   BMI 38.66 kg/m    Physical Exam Constitutional:  Not in acute distress, appears comfortable     Comments:   Cardiovascular:     Rate and Rhythm: Normal rate and regular rhythm.     Heart sounds:   Pulmonary:     Effort: Pulmonary effort is normal.     Comments: clear air entry   Abdominal:     Palpations:  Abdomen is soft.     Tenderness: Non tender, BS+  Musculoskeletal:        General: Left BKA bandaged and Rt TMA site bandageed  Skin:    Comments: no lesions or rashes   Neurological:     General: No focal deficit present.   Psychiatric:        Mood and Affect: Mood normal.   Pertinent Microbiology Results for orders placed or performed during the hospital encounter of 08/08/20  Blood culture (routine x 2)     Status: None (Preliminary result)   Collection Time: 08/08/20  8:44 PM   Specimen: BLOOD  Result Value Ref Range Status   Specimen Description BLOOD SITE NOT SPECIFIED  Final   Special Requests   Final    BOTTLES DRAWN AEROBIC AND ANAEROBIC Blood Culture results may not be optimal due to an inadequate volume of blood received in culture bottles   Culture   Final    NO GROWTH 4 DAYS Performed at [facility], [place] [address], [place], [place] [address]    Report Status PENDING  Incomplete  SARS CORONAVIRUS 2 (TAT 6-24 HRS) Nasopharyngeal Nasopharyngeal Swab     Status: None   Collection Time: 08/08/20 10:31 PM   Specimen: Nasopharyngeal Swab  Result Value Ref Range Status   SARS Coronavirus 2 NEGATIVE NEGATIVE Final    Comment: (NOTE) SARS-CoV-2 target nucleic acids are NOT DETECTED.  The SARS-CoV-2 RNA is generally detectable in upper and lower respiratory specimens during the acute phase of infection. Negative results do not preclude SARS-CoV-2 infection, do not rule out co-infections with other pathogens, and should not be used  as the sole basis for treatment or other patient management decisions. Negative results must be combined with clinical observations, patient history, and epidemiological information. The expected result is Negative.  Fact Sheet for Patients: SugarRoll.be  Fact Sheet for Healthcare Providers: https://www.woods-mathews.com/  This test is not yet approved or cleared by the [place] FDA  and  has been authorized for detection and/or diagnosis of SARS-CoV-2 by FDA under an Emergency Use Authorization (EUA). This EUA will remain  in effect (meaning this test can be used) for the duration of the COVID-19 declaration under Se ction 564(b)(1) of the Act, 21 U.S.C. section 360bbb-3(b)(1), unless the authorization is terminated or revoked sooner.  Performed at [facility], [place] [address] [place], [place] [address]   Surgical pcr screen     Status: None   Collection Time: 08/10/20  9:59 PM   Specimen: Nasal Mucosa; Nasal Swab  Result Value Ref Range Status   MRSA, PCR NEGATIVE NEGATIVE Final   Staphylococcus aureus NEGATIVE NEGATIVE Final    Comment: (NOTE) The Xpert SA Assay (FDA approved for NASAL specimens in patients 57 years of age and older), is one component of a comprehensive surveillance program. It is not intended to diagnose infection nor to guide or monitor treatment. Performed at [facility], [place] [address] [place], [place] [address]      Pertinent Lab seen by me: CBC Latest Ref Rng & Units 08/13/2020 08/12/2020 08/11/2020  WBC 4.0 - 10.5 K/uL 20.5(H) 24.5(H) 15.5(H)  Hemoglobin 12.0 - 15.0 g/dL 7.4(L) 7.6(L) 8.1(L)  Hematocrit 36.0 - 46.0 % 24.3(L) 26.1(L) 27.0(L)  Platelets 150 - 400 K/uL 653(H) 722(H) 751(H)   CMP Latest Ref Rng & Units 08/13/2020 08/12/2020 08/11/2020  Glucose 70 - 99 mg/dL 152(H) 237(H) 149(H)  BUN 8 - 23 mg/dL 26(H) 53(H) 32(H)  Creatinine 0.44 - 1.00 mg/dL 5.42(H) 8.76(H) 7.01(H)  Sodium 135 - 145 mmol/L 133(L) 133(L) 132(L)  Potassium 3.5 - 5.1 mmol/L 3.6 4.2 3.9  Chloride 98 - 111 mmol/L 93(L) 97(L) 96(L)  CO2 22 - 32 mmol/L _0 Calcium 8.9 - 10.3 mg/dL 8.5(L) 8.9 8.5(L)  Total Protein 6.5 - 8.1 g/dL - - -  Total Bilirubin 0.3 - 1.2 mg/dL - - -  Alkaline Phos 38 - 126 U/L - - -  AST 15 - 41 U/L - - -  ALT 0 - 44 U/L - - -    Pertinent Imagings/Other Imagings Plain films and CT images have been  personally visualized and interpreted; radiology reports have been reviewed. Decision making incorporated into the Impression / Recommendations.  Xray Rt Foot 08/08/20  FINDINGS: Bones appear osteopenic. Old fracture deformity base of fifth metatarsal. Apparent partial amputation of third digit with residual proximal phalanx. Slight erosive change at the cut end of the proximal phalanx. Suspected bony resorption of the second distal phalanx. Possible erosions at the base of the second and third proximal phalanges and head of third metatarsal. Vascular calcifications. 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  IMPRESSION: 1. Apparent interval partial amputation third digit with remnant proximal phalanx. Findings suspicious for osteomyelitis involving the distal phalanx of the second digit and the cut end of the residual third proximal phalanx. There may be additional erosive change involving the bases of the second and third proximal phalanges and head of third metatarsal. 2. 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  I have spent 60 minutes for this patient encounter including review of prior medical records with greater than 50% of time being face to face and coordination of their care.  Electronically signed by:   Rosiland Oz, MD Infectious Disease Physician [facility] for Infectious Disease Pager: [phone]

## 2020-08-13 NOTE — Progress Notes (Signed)
PROGRESS NOTE    Christy Zhang  AUQ:[id number] DOB: 01/18/1956 DOA: 08/08/2020 PCP: Claretta Fraise, MD   Chief Complaint  Patient presents with  . Foot Pain  Brief Narrative: 65 year old female with PMH of Chronic Hypertension, Uncontrolled Diabetes, ESRD on HD MWF, and Peripheral Vascular Disease s/p Left BKA who presents to the ED on 4/13 with worsening RLE wounds with necrotic tissue while on Cephalex.   Xray showed evidence of osteomyelitis.  ESR is 92 and CRP is 17.  On 4/14, angiogram showed critical limb ischemia.  On 4/15 Vascular Dr. Donzetta Matters performed open transmetatarsal amputation.  Vascular Surgery recommends continued close monitoring.  Patient is high risk of more proximal amputation.  Subjective: Patient was evaluated during dialysis. She is pleasant. Denies any chest pain or shortness of breath. Wounds are stable.  Assessment & Plan:  Acute Limb Ischemia/ Right Lower Extremity Osteomyelitis s/p 4/15 Right openTMA: - Wounds are healing well.  Vascular Surgery will consider transtibial vs further amputation based on improvement. - Wound Cultures are still pending. - On IV Vancomycin, Ceftriaxone, and Flagyl.. - Infectious Disease is following, appreciate recommendations.  Leukocytosis: - Blood Cultures are pending. - Continue antibiotics as above.  Peripheral Vascular Disease s/p 2/10 Left BKA: - Continue Aspirin and Statin. - Continue local wound care.  ESRD and HD MWF:  - Nephrology is following, appreciate.  Anemia of CKD: on ESA. Hyperparathyroidism: on Renvela and Hectorol.  Diabetes Mellitus Type 2 on long term insulin: A1C is 8.8%. - Glucose is stable today. - Continue Lantus 10 units and Lispro SSi with POC glucose q6hrs.  Essential (primary) hypertension:  - BP is stable today. - Continue Metoprolol and Amlodipine.  Morbid obesity with BMI 38 kg/m2: - Counseled on weight loss through diet and lifestyle modification.  Diet Order             Diet renal/carb modified with fluid restriction Diet-HS Snack? Nothing; Fluid restriction: 1200 mL Fluid; Room service appropriate? Yes; Fluid consistency: Thin  Diet effective now                Patient's Body mass index is 38.66 kg/m. DVT prophylaxis: heparin injection 5,000 Units Start: 08/09/20 1400 Code Status:   Code Status: Full Code  Family Communication: plan of care discussed with patient at bedside.  Status is: Inpatient Remains inpatient appropriate because:IV treatments appropriate due to intensity of illness or inability to take PO and Inpatient level of care appropriate due to severity of illness remains hospitalized for ongoing management of gangrenous right foot.  Dispo: The patient is from: Home              Anticipated d/c is to: TBD, anticipate hospitalization through the weekend- early next week.              Patient currently is not medically stable to d/c.   Difficult to place patient No   Unresulted Labs (From admission, onward)          Start     Ordered   08/13/20 0500  CBC with Differential/Platelet  Daily,   R      08/12/20 0811   08/13/20 6389  Basic metabolic panel  Daily,   R      08/12/20 0811   08/13/20 0500  Culture, blood (routine x 2)  BLOOD CULTURE X 2,   R (with TIMED occurrences)      08/12/20 1648        Medications reviewed:  Scheduled Meds: Marland Kitchen (  feeding supplement) PROSource Plus  30 mL Oral BID BM  . amLODipine  5 mg Oral QHS  . aspirin EC  81 mg Oral QHS  . atorvastatin  40 mg Oral Daily  . Chlorhexidine Gluconate Cloth  6 each Topical Q0600  . clopidogrel  75 mg Oral QPM  . doxercalciferol  1.5 mcg Intravenous Q M,W,F-HD  . feeding supplement (NEPRO CARB STEADY)  237 mL Oral Q24H  . gabapentin  300 mg Oral QHS  . heparin  5,000 Units Subcutaneous Q8H  . insulin aspart  0-6 Units Subcutaneous TID WC  . insulin glargine  10 Units Subcutaneous Daily  . metoprolol succinate  50 mg Oral Daily  . metroNIDAZOLE  500 mg Oral Q8H  .  multivitamin  1 tablet Oral QHS  . polyethylene glycol  17 g Oral Daily  . senna-docusate  1 tablet Oral BID  . sevelamer carbonate  2,400 mg Oral TID WC  . sodium chloride flush  3 mL Intravenous Q12H   Continuous Infusions: . sodium chloride    . cefTRIAXone (ROCEPHIN)  IV 2 g (08/12/20 2038)  . vancomycin      Consultants:see note  Procedures:see note  Antimicrobials: Anti-infectives (From admission, onward)   Start     Dose/Rate Route Frequency Ordered Stop   08/13/20 0000  vancomycin (VANCOCIN) IVPB 1000 mg/200 mL premix        1,000 mg 200 mL/hr over 60 Minutes Intravenous Every M-W-F (Hemodialysis) 08/10/20 1333     08/12/20 1200  vancomycin (VANCOCIN) IVPB 1000 mg/200 mL premix  Status:  Discontinued        1,000 mg 200 mL/hr over 60 Minutes Intravenous Every T-Th-Sa (Hemodialysis) 08/10/20 1333 08/11/20 1319   08/12/20 1200  vancomycin (VANCOCIN) IVPB 1000 mg/200 mL premix        1,000 mg 200 mL/hr over 60 Minutes Intravenous Every Sat (Hemodialysis) 08/11/20 1319 08/12/20 1236   08/09/20 2200  cefTRIAXone (ROCEPHIN) 2 g in sodium chloride 0.9 % 100 mL IVPB        2 g 200 mL/hr over 30 Minutes Intravenous Every 24 hours 08/09/20 0054     08/09/20 1200  vancomycin (VANCOREADY) IVPB 1000 mg/200 mL  Status:  Discontinued        1,000 mg 200 mL/hr over 60 Minutes Intravenous Every M-W-F (Hemodialysis) 08/08/20 2217 08/09/20 0812   08/09/20 1200  ceFEPIme (MAXIPIME) 2 g in sodium chloride 0.9 % 100 mL IVPB  Status:  Discontinued        2 g 200 mL/hr over 30 Minutes Intravenous Every M-W-F (Hemodialysis) 08/08/20 2217 08/09/20 0054   08/09/20 1200  vancomycin (VANCOCIN) IVPB 1000 mg/200 mL premix  Status:  Discontinued        1,000 mg 200 mL/hr over 60 Minutes Intravenous Every M-W-F (Hemodialysis) 08/09/20 0812 08/10/20 1333   08/09/20 0100  metroNIDAZOLE (FLAGYL) tablet 500 mg        500 mg Oral Every 8 hours 08/09/20 0054     08/08/20 2200  vancomycin (VANCOREADY) IVPB  2000 mg/400 mL        2,000 mg 200 mL/hr over 120 Minutes Intravenous  Once 08/08/20 2145 08/09/20 0143   08/08/20 2200  ceFEPIme (MAXIPIME) 2 g in sodium chloride 0.9 % 100 mL IVPB        2 g 200 mL/hr over 30 Minutes Intravenous  Once 08/08/20 2145 08/08/20 2333   08/08/20 2145  vancomycin (VANCOREADY) IVPB 1000 mg/200 mL  Status:  Discontinued  1,000 mg 200 mL/hr over 60 Minutes Intravenous  Once 08/08/20 2136 08/08/20 2145     Culture/Microbiology    Component Value Date/Time   SDES BLOOD SITE NOT SPECIFIED 08/08/2020 2044   SPECREQUEST  08/08/2020 2044    BOTTLES DRAWN AEROBIC AND ANAEROBIC Blood Culture results may not be optimal due to an inadequate volume of blood received in culture bottles   CULT  08/08/2020 2044    NO GROWTH 4 DAYS Performed at [facility], [place] [address] [place], [place] [address]    REPTSTATUS PENDING 08/08/2020 2044    Other culture-see note  Objective: Vitals: Today's Vitals   08/13/20 0817 08/13/20 0902 08/13/20 1125 08/13/20 1330  BP: (!) 123/55  (!) 136/59   Pulse: 72  89   Resp: 17  17   Temp: 98.1 F (36.7 C)  98.6 F (37 C)   TempSrc: Oral  Oral   SpO2: 95%  95%   Weight:      Height:      PainSc: $Remove'5  3   2     '[id number]$ Intake/Output Summary (Last 24 hours) at 08/13/2020 1513 Last data filed at 08/13/2020 0848 Gross per 24 hour  Intake 1530 ml  Output --  Net 1530 ml   Filed Weights   08/12/20 0840 08/12/20 1249 08/13/20 0513  Weight: 99.6 kg 97.4 kg 99 kg   Weight change: -0.4 kg  Intake/Output from previous day: 04/16 0701 - 04/17 0700 In: 1530 [P.O.:1230; IV Piggyback:300] Out: 2000  Intake/Output this shift: Total I/O In: 240 [P.O.:240] Out: -  Filed Weights   08/12/20 0840 08/12/20 1249 08/13/20 0513  Weight: 99.6 kg 97.4 kg 99 kg    Examination: General exam: AAOx3, pleasant, NAD, weak appearing. HEENT:Oral mucosa moist, Ear/Nose WNL grossly, dentition normal. Respiratory system: bilaterally clear,no  wheezing or crackles,no use of accessory muscle Cardiovascular system: S1 & S2 +, No JVD,. Gastrointestinal system: Abdomen soft, NT,ND, BS+ Nervous System:Alert, awake, moving extremities and grossly nonfocal Extremities: No edema, recent left BKA, right foot TMA with dressing in place Skin: No rashes,no icterus. MSK: Normal muscle bulk,tone, power  Data Reviewed: I have personally reviewed following labs and imaging studies CBC: Recent Labs  Lab 08/08/20 1855 08/09/20 0348 08/10/20 0355 08/11/20 0245 08/12/20 0853 08/13/20 0124  WBC 18.3* 16.9* 16.5* 15.5* 24.5* 20.5*  NEUTROABS 14.3*  --   --   --   --  15.9*  HGB 9.2* 8.6* 8.9* 8.1* 7.6* 7.4*  HCT 31.3* 29.1* 29.7* 27.0* 26.1* 24.3*  MCV 84.6 82.9 82.5 82.3 84.7 81.8  PLT 894* 782* 762* 751* 722* 981*   Basic Metabolic Panel: Recent Labs  Lab 08/08/20 1855 08/09/20 0348 08/10/20 0355 08/11/20 0245 08/12/20 0852 08/13/20 0124  NA 136 135 135 132* 133* 133*  K 4.7 4.3 3.8 3.9 4.2 3.6  CL 96* 98 97* 96* 97* 93*  CO2 $Re'26 25 28 26 23 29  'sbh$ GLUCOSE 166* 93 114* 149* 237* 152*  BUN $Re'20 22 15 'LlF$ 32* 53* 26*  CREATININE 7.67* 8.25* 5.23* 7.01* 8.76* 5.42*  CALCIUM 8.9 8.7* 8.7* 8.5* 8.9 8.5*  MG 1.9  --   --   --   --   --   PHOS  --   --   --   --  5.2*  --    GFR: Estimated Creatinine Clearance: 11.9 mL/min (A) (by C-G formula based on SCr of 5.42 mg/dL (H)). Liver Function Tests: Recent Labs  Lab 08/08/20 1855 08/12/20  0852  AST 10*  --   ALT 7  --   ALKPHOS 63  --   BILITOT 0.6  --   PROT 8.0  --   ALBUMIN 2.6* 2.1*   No results for input(s): LIPASE, AMYLASE in the last 168 hours. No results for input(s): AMMONIA in the last 168 hours. Coagulation Profile: Recent Labs  Lab 08/10/20 0355  INR 1.2   Cardiac Enzymes: No results for input(s): CKTOTAL, CKMB, CKMBINDEX, TROPONINI in the last 168 hours. BNP (last 3 results) No results for input(s): PROBNP in the last 8760 hours. HbA1C: No results for input(s):  HGBA1C in the last 72 hours. CBG: Recent Labs  Lab 08/12/20 0634 08/12/20 1709 08/12/20 2116 08/13/20 0614 08/13/20 1123  GLUCAP 232* 187* 168* 107* 130*   Lipid Profile: No results for input(s): CHOL, HDL, LDLCALC, TRIG, CHOLHDL, LDLDIRECT in the last 72 hours. Thyroid Function Tests: No results for input(s): TSH, T4TOTAL, FREET4, T3FREE, THYROIDAB in the last 72 hours. Anemia Panel: No results for input(s): VITAMINB12, FOLATE, FERRITIN, TIBC, IRON, RETICCTPCT in the last 72 hours. Sepsis Labs: Recent Labs  Lab 08/08/20 1855  LATICACIDVEN 1.4    Recent Results (from the past 240 hour(s))  Blood culture (routine x 2)     Status: None (Preliminary result)   Collection Time: 08/08/20  8:44 PM   Specimen: BLOOD  Result Value Ref Range Status   Specimen Description BLOOD SITE NOT SPECIFIED  Final   Special Requests   Final    BOTTLES DRAWN AEROBIC AND ANAEROBIC Blood Culture results may not be optimal due to an inadequate volume of blood received in culture bottles   Culture   Final    NO GROWTH 4 DAYS Performed at [facility], [place] [address], [place], [place] [address]    Report Status PENDING  Incomplete  SARS CORONAVIRUS 2 (TAT 6-24 HRS) Nasopharyngeal Nasopharyngeal Swab     Status: None   Collection Time: 08/08/20 10:31 PM   Specimen: Nasopharyngeal Swab  Result Value Ref Range Status   SARS Coronavirus 2 NEGATIVE NEGATIVE Final    Comment: (NOTE) SARS-CoV-2 target nucleic acids are NOT DETECTED.  The SARS-CoV-2 RNA is generally detectable in upper and lower respiratory specimens during the acute phase of infection. Negative results do not preclude SARS-CoV-2 infection, do not rule out co-infections with other pathogens, and should not be used as the sole basis for treatment or other patient management decisions. Negative results must be combined with clinical observations, patient history, and epidemiological information. The expected result is  Negative.  Fact Sheet for Patients: SugarRoll.be  Fact Sheet for Healthcare Providers: https://www.woods-mathews.com/  This test is not yet approved or cleared by the [place] FDA and  has been authorized for detection and/or diagnosis of SARS-CoV-2 by FDA under an Emergency Use Authorization (EUA). This EUA will remain  in effect (meaning this test can be used) for the duration of the COVID-19 declaration under Se ction 564(b)(1) of the Act, 21 U.S.C. section 360bbb-3(b)(1), unless the authorization is terminated or revoked sooner.  Performed at [facility], [place] [address] [place], [place] [address]   Surgical pcr screen     Status: None   Collection Time: 08/10/20  9:59 PM   Specimen: Nasal Mucosa; Nasal Swab  Result Value Ref Range Status   MRSA, PCR NEGATIVE NEGATIVE Final   Staphylococcus aureus NEGATIVE NEGATIVE Final    Comment: (NOTE) The Xpert SA Assay (FDA approved for NASAL specimens in  patients 57 years of age and older), is one component of a comprehensive surveillance program. It is not intended to diagnose infection nor to guide or monitor treatment. Performed at [facility], [place] [address] [place], [place] [address]      Radiology Studies: No results found.   LOS: 5 days   George Hugh, MD [facility]  08/13/2020, 3:13 PM

## 2020-08-13 NOTE — Progress Notes (Signed)
Pharmacy Antibiotic Note  Christy Zhang is a 65 y.o. female admitted on 08/08/2020 with wound infection.  Pharmacy has been consulted for vancomycin dosing.  Presenting with R foot gangrene/osteomyelitis. She is s/p RLE arteriogram/angioplasty and R TMA on 4/15. Patient is ESRD with HD MWF. WBC 20.5, afebrile.   Plan: Vancomycin 1g IV qHD session Ceftriaxone 2g IV every 24 hours Flagyl 500 mg PO every 8 hours Monitor clinical status, HD schedule, and vanc levels as appropriate   Height: '5\' 3"'$  (160 cm) Weight: 99 kg (218 lb 4.1 oz) IBW/kg (Calculated) : 52.4  Temp (24hrs), Avg:98.2 F (36.8 C), Min:98 F (36.7 C), Max:98.5 F (36.9 C)  Recent Labs  Lab 08/08/20 1855 08/09/20 0348 08/10/20 0355 08/11/20 0245 08/12/20 0852 08/12/20 0853 08/13/20 0124  WBC 18.3* 16.9* 16.5* 15.5*  --  24.5* 20.5*  CREATININE 7.67* 8.25* 5.23* 7.01* 8.76*  --  5.42*  LATICACIDVEN 1.4  --   --   --   --   --   --     Estimated Creatinine Clearance: 11.9 mL/min (A) (by C-G formula based on SCr of 5.42 mg/dL (H)).    Allergies  Allergen Reactions  . Sulfa Antibiotics Swelling    Antimicrobials this admission: Vanco 4/12>> Cefepime 4/12 x 1 Flagyl 4/12>> CTX 4/13>>  Dose adjustments this admission: N/A  Microbiology results: 4/12 BCx: ngtd 4/17 Bcx: sent  Romilda Garret, PharmD PGY1 Acute Care Pharmacy Resident 08/13/2020 8:54 AM  Please check [email or web address] for unit specific pharmacy phone numbers.

## 2020-08-13 NOTE — Progress Notes (Addendum)
VASCULAR SURGERY ASSESSMENT & PLAN:   2 Days Post-Op  Critical limb ischemia of the right lower extremity with tissue loss/gangrene of right toes. Plain films were suspicious for osteo of distal phalanx of the second digit and the cut end of the residual third proximal phalanx and possible additional erosive change involving the bases of the second and third proximal phalanges and head of third metatarsal. Now status post right TMA.  Afebrile. Wound bed clean. Continue wet-to-dry dressing today. Consider wound VAC placement. Plavix, statin and asa continue.   Leukocytosis persists>down today to 20.5. On vancomycin, ceftriaxone and metronidazole.  SUBJECTIVE:   No complaints.  PHYSICAL EXAM:   Vitals:   08/12/20 2225 08/13/20 0431 08/13/20 0513 08/13/20 0817  BP: (!) 140/54 129/67  (!) 123/55  Pulse: 78 66  72  Resp: '20 19  17  '$ Temp: 98.2 F (36.8 C) 98.1 F (36.7 C)  98.1 F (36.7 C)  TempSrc: Oral Oral  Oral  SpO2: 99% 96%  95%  Weight:   99 kg   Height:       General appearance: Awake, alert in no apparent distress Cardiac: Heart rate and rhythm are regular Respirations: Nonlabored Extremities: Right foot dressing removed. Wound bed is clean with some bleeding. Foot is warm.      LABS:   Lab Results  Component Value Date   WBC 20.5 (H) 08/13/2020   HGB 7.4 (L) 08/13/2020   HCT 24.3 (L) 08/13/2020   MCV 81.8 08/13/2020   PLT 653 (H) 08/13/2020   Lab Results  Component Value Date   CREATININE 5.42 (H) 08/13/2020   Lab Results  Component Value Date   INR 1.2 08/10/2020   CBG (last 3)  Recent Labs    08/12/20 1709 08/12/20 2116 08/13/20 0614  GLUCAP 187* 168* 107*    PROBLEM LIST:    Principal Problem:   Osteomyelitis of right foot (HCC) Active Problems:   ESRD (end stage renal disease) ([place])   Controlled type 2 diabetes mellitus with chronic kidney disease on chronic dialysis, with long-term current use of insulin (HCC)   Essential (primary)  hypertension   Thrombocytosis   Pressure injury of skin   CURRENT MEDS:   . (feeding supplement) PROSource Plus  30 mL Oral BID BM  . amLODipine  5 mg Oral QHS  . aspirin EC  81 mg Oral QHS  . atorvastatin  40 mg Oral Daily  . Chlorhexidine Gluconate Cloth  6 each Topical Q0600  . clopidogrel  75 mg Oral QPM  . doxercalciferol  1.5 mcg Intravenous Q M,W,F-HD  . feeding supplement (NEPRO CARB STEADY)  237 mL Oral Q24H  . gabapentin  300 mg Oral QHS  . heparin  5,000 Units Subcutaneous Q8H  . insulin aspart  0-6 Units Subcutaneous TID WC  . insulin glargine  10 Units Subcutaneous Daily  . metoprolol succinate  50 mg Oral Daily  . metroNIDAZOLE  500 mg Oral Q8H  . multivitamin  1 tablet Oral QHS  . polyethylene glycol  17 g Oral Daily  . senna-docusate  1 tablet Oral BID  . sevelamer carbonate  2,400 mg Oral TID WC  . sodium chloride flush  3 mL Intravenous Q12H    Barbie Banner, [place] Office: [phone] 08/13/2020    I have interviewed and examined patient with PA and agree with assessment and plan above.  Her pain is well controlled on her foot and all the tissue appears viable.  Leukocytosis improving.  Possibly can transition to wound VAC in hopes of salvaging her right lower extremity.  She remains high risk for below the knee amputation.  Rawleigh Rode C. Donzetta Matters, MD [facility] Office: [phone] Pager: [phone]

## 2020-08-13 NOTE — Progress Notes (Signed)
[facility] Progress Note   Dialysis Orders: [facility] MWF 4hrs 2K 2.5Ca EDW 97 kg RU AVG Heparin 1000 load and 1000 units qhr EPO 14000 units qHD Venofer '100mg'$  qHD Hectorol 1.22mg qHD  Assessment/ Plan:   1. 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 CCarlis AbbottVVS w/ successful PTA of rt AT. -Underwent right transmetatarsal amputation with Dr. CDonzetta Matterson 4/15.  No complications  2.  ESRD:  MWF outpatient schedulevia RUA AVG-  Plan to resume MWF schedule tomorrow  3. HTN/ volume: B Continue home metoprolol and Norvasc  4. DM2- peradmitting team. 5. Anemiaof ESRD:  Aranesp  200 qFriday and not candidate for IV iron with active infection; could consider IV iron at this point now status post surgery but white blood cell count remains high. Transfuse as needed as she may not respond to ESA with an active infection. 6. MBDof ESRD-Continue renvela and hectorol.    Subjective:   Patient states she feels well today with no complaints.  Dialysis went well yesterday   Objective:   BP (!) 123/55 (BP Location: Left Arm)   Pulse 72   Temp 98.1 F (36.7 C) (Oral)   Resp 17   Ht '5\' 3"'$  (1.6 m)   Wt 99 kg   LMP  (LMP Unknown)   SpO2 95%   BMI 38.66 kg/m   Intake/Output Summary (Last 24 hours) at 08/13/2020 0914 Last data filed at 08/13/2020 0848 Gross per 24 hour  Intake 1770 ml  Output 2000 ml  Net -230 ml   Weight change: -0.4 kg  Physical Exam: GEN: NAD, A&Ox3, NCAT HEENT: No conjunctival pallor, EOMI NECK: Supple, no thyromegaly LUNGS: Bilateral chest rise with no increased work of breathing CV: RRR, No M/R/G ABD: SNDNT +BS  EXT: Lt BKA and R foot in bandage ACCESS: RUA AVG  good bruit    Imaging: No results found.  Labs: BMET Recent Labs  Lab 08/08/20 1855 08/09/20 0348 08/10/20 0355 08/11/20 0245 08/12/20 0852 08/13/20 0124  NA 136 135 135 132* 133* 133*  K 4.7 4.3 3.8 3.9 4.2 3.6  CL 96* 98 97* 96* 97* 93*  CO2  '26 25 28 26 23 29  '$ GLUCOSE 166* 93 114* 149* 237* 152*  BUN '20 22 15 '$ 32* 53* 26*  CREATININE 7.67* 8.25* 5.23* 7.01* 8.76* 5.42*  CALCIUM 8.9 8.7* 8.7* 8.5* 8.9 8.5*  PHOS  --   --   --   --  5.2*  --    CBC Recent Labs  Lab 08/08/20 1855 08/09/20 0348 08/10/20 0355 08/11/20 0245 08/12/20 0853 08/13/20 0124  WBC 18.3*   < > 16.5* 15.5* 24.5* 20.5*  NEUTROABS 14.3*  --   --   --   --  15.9*  HGB 9.2*   < > 8.9* 8.1* 7.6* 7.4*  HCT 31.3*   < > 29.7* 27.0* 26.1* 24.3*  MCV 84.6   < > 82.5 82.3 84.7 81.8  PLT 894*   < > 762* 751* 722* 653*   < > = values in this interval not displayed.    Medications:    . (feeding supplement) PROSource Plus  30 mL Oral BID BM  . amLODipine  5 mg Oral QHS  . aspirin EC  81 mg Oral QHS  . atorvastatin  40 mg Oral Daily  . Chlorhexidine Gluconate Cloth  6 each Topical Q0600  . clopidogrel  75 mg Oral QPM  . doxercalciferol  1.5 mcg  Intravenous Q M,W,F-HD  . feeding supplement (NEPRO CARB STEADY)  237 mL Oral Q24H  . gabapentin  300 mg Oral QHS  . heparin  5,000 Units Subcutaneous Q8H  . insulin aspart  0-6 Units Subcutaneous TID WC  . insulin glargine  10 Units Subcutaneous Daily  . metoprolol succinate  50 mg Oral Daily  . metroNIDAZOLE  500 mg Oral Q8H  . multivitamin  1 tablet Oral QHS  . polyethylene glycol  17 g Oral Daily  . senna-docusate  1 tablet Oral BID  . sevelamer carbonate  2,400 mg Oral TID WC  . sodium chloride flush  3 mL Intravenous Q12H     Reesa Chew  08/13/2020, 9:14 AM

## 2020-08-14 ENCOUNTER — Telehealth: Payer: Self-pay

## 2020-08-14 ENCOUNTER — Encounter (HOSPITAL_COMMUNITY): Payer: Self-pay | Admitting: Vascular Surgery

## 2020-08-14 DIAGNOSIS — Z8616 Personal history of COVID-19: Secondary | ICD-10-CM | POA: Diagnosis not present

## 2020-08-14 DIAGNOSIS — M86671 Other chronic osteomyelitis, right ankle and foot: Secondary | ICD-10-CM

## 2020-08-14 DIAGNOSIS — D72829 Elevated white blood cell count, unspecified: Secondary | ICD-10-CM | POA: Diagnosis not present

## 2020-08-14 LAB — GLUCOSE, CAPILLARY
Glucose-Capillary: 141 mg/dL — ABNORMAL HIGH (ref 70–99)
Glucose-Capillary: 116 mg/dL — ABNORMAL HIGH (ref 70–99)
Glucose-Capillary: 104 mg/dL — ABNORMAL HIGH (ref 70–99)
Glucose-Capillary: 122 mg/dL — ABNORMAL HIGH (ref 70–99)
Glucose-Capillary: 186 mg/dL — ABNORMAL HIGH (ref 70–99)

## 2020-08-14 LAB — CBC WITH DIFFERENTIAL/PLATELET
Abs Immature Granulocytes: 0.09 10*3/uL — ABNORMAL HIGH (ref 0.00–0.07)
Basophils Absolute: 0.1 10*3/uL (ref 0.0–0.1)
Basophils Relative: 1 %
Eosinophils Absolute: 0.7 10*3/uL — ABNORMAL HIGH (ref 0.0–0.5)
Eosinophils Relative: 4 %
HCT: 28.3 % — ABNORMAL LOW (ref 36.0–46.0)
Hemoglobin: 8.5 g/dL — ABNORMAL LOW (ref 12.0–15.0)
Immature Granulocytes: 1 %
Lymphocytes Relative: 17 %
Lymphs Abs: 2.6 10*3/uL (ref 0.7–4.0)
MCH: 24.8 pg — ABNORMAL LOW (ref 26.0–34.0)
MCHC: 30 g/dL (ref 30.0–36.0)
MCV: 82.5 fL (ref 80.0–100.0)
Monocytes Absolute: 0.9 10*3/uL (ref 0.1–1.0)
Monocytes Relative: 6 %
Neutro Abs: 11.4 10*3/uL — ABNORMAL HIGH (ref 1.7–7.7)
Neutrophils Relative %: 71 %
Platelets: 639 10*3/uL — ABNORMAL HIGH (ref 150–400)
RBC: 3.43 MIL/uL — ABNORMAL LOW (ref 3.87–5.11)
RDW: 18 % — ABNORMAL HIGH (ref 11.5–15.5)
WBC: 15.9 10*3/uL — ABNORMAL HIGH (ref 4.0–10.5)
nRBC: 0 % (ref 0.0–0.2)

## 2020-08-14 LAB — BASIC METABOLIC PANEL
Anion gap: 15 (ref 5–15)
BUN: 40 mg/dL — ABNORMAL HIGH (ref 8–23)
CO2: 24 mmol/L (ref 22–32)
Calcium: 8.4 mg/dL — ABNORMAL LOW (ref 8.9–10.3)
Chloride: 92 mmol/L — ABNORMAL LOW (ref 98–111)
Creatinine, Ser: 7.19 mg/dL — ABNORMAL HIGH (ref 0.44–1.00)
GFR, Estimated: 6 mL/min — ABNORMAL LOW (ref >60)
Glucose, Bld: 108 mg/dL — ABNORMAL HIGH (ref 70–99)
Potassium: 4.1 mmol/L (ref 3.5–5.1)
Sodium: 131 mmol/L — ABNORMAL LOW (ref 135–145)

## 2020-08-14 MED ORDER — HEPARIN SODIUM (PORCINE) 1000 UNIT/ML DIALYSIS: 1000.0000 [IU] | INTRAMUSCULAR | Status: DC | PRN

## 2020-08-14 MED ORDER — LIDOCAINE HCL (PF) 1 % IJ SOLN: 5.0000 mL | INTRAMUSCULAR | Status: DC | PRN

## 2020-08-14 MED ORDER — SODIUM CHLORIDE 0.9 % IV SOLN: 100.0000 mL | INTRAVENOUS | Status: DC | PRN

## 2020-08-14 MED ORDER — VANCOMYCIN HCL IN DEXTROSE 1-5 GM/200ML-% IV SOLN
INTRAVENOUS | Status: AC
  Administered 2020-08-14: 1000 mg via INTRAVENOUS
  Filled 2020-08-14: qty 200

## 2020-08-14 MED ORDER — PENTAFLUOROPROP-TETRAFLUOROETH EX AERO: 1.0000 "application " | INHALATION_SPRAY | CUTANEOUS | Status: DC | PRN

## 2020-08-14 MED ORDER — DOXERCALCIFEROL 4 MCG/2ML IV SOLN
INTRAVENOUS | Status: AC
  Administered 2020-08-14: 1.5 ug via INTRAVENOUS
  Filled 2020-08-14: qty 2

## 2020-08-14 MED ORDER — ALTEPLASE 2 MG IJ SOLR: 2.0000 mg | Freq: Once | INTRAMUSCULAR | Status: DC | PRN

## 2020-08-14 MED ORDER — LIDOCAINE-PRILOCAINE 2.5-2.5 % EX CREA: 1.0000 "application " | TOPICAL_CREAM | CUTANEOUS | Status: DC | PRN

## 2020-08-14 NOTE — Progress Notes (Addendum)
[facility] for Infectious Disease    Date of Admission:  08/08/2020   Total days of antibiotics 7           ID: Christy Zhang is a 65 y.o. female with ischemic right foot gangrene/osteomyelitis with acute limb ischemia s/p Right lower extremity arteriogram, right anterior tibial artery angioplasty on 08/10/20 followed by open right TMA on 4/15.  unable to close due to extensive fat necrosis plantar aspect but all of devitalized tissue has been removed. Principal Problem:   Osteomyelitis of right foot ([place]) Active Problems:   ESRD (end stage renal disease) ([place])   Controlled type 2 diabetes mellitus with chronic kidney disease on chronic dialysis, with long-term current use of insulin (H[facility])   Essential (primary) hypertension   Thrombocytosis   Pressure injury of skin   Diabetic foot infection ([place])   Medication monitoring encounter    Subjective: Undergoing HD without complications. Still having some moderate pain to right surgical region. She denies fever/chillls/nightsweats/diarrhea   Medications:  . (feeding supplement) PROSource Plus  30 mL Oral BID BM  . amLODipine  5 mg Oral QHS  . aspirin EC  81 mg Oral QHS  . atorvastatin  40 mg Oral Daily  . Chlorhexidine Gluconate Cloth  6 each Topical Q0600  . clopidogrel  75 mg Oral QPM  . doxercalciferol  1.5 mcg Intravenous Q M,W,F-HD  . feeding supplement (NEPRO CARB STEADY)  237 mL Oral Q24H  . gabapentin  300 mg Oral QHS  . heparin  5,000 Units Subcutaneous Q8H  . insulin aspart  0-6 Units Subcutaneous TID WC  . insulin glargine  10 Units Subcutaneous Daily  . metoprolol succinate  50 mg Oral Daily  . metroNIDAZOLE  500 mg Oral Q8H  . multivitamin  1 tablet Oral QHS  . polyethylene glycol  17 g Oral Daily  . senna-docusate  1 tablet Oral BID  . sevelamer carbonate  2,400 mg Oral TID WC  . sodium chloride flush  3 mL Intravenous Q12H    Objective: Vital signs in last 24 hours: Temp:  [97.5 F (36.4 C)-98.6 F  (37 C)] 98.5 F (36.9 C) (04/18 0735) Pulse Rate:  [62-89] 68 (04/18 1040) Resp:  [10-17] 14 (04/18 1040) BP: (93-148)/(46-69) 93/52 (04/18 1100) SpO2:  [95 %-98 %] 97 % (04/18 0900) Weight:  [101.2 kg-102 kg] 102 kg (04/18 0730) Physical Exam  Constitutional:  oriented to person, place, and time. appears well-developed and well-nourished. No distress.  HENT: [place]/AT, PERRLA, no scleral icterus Mouth/Throat: Oropharynx is clear and moist. No oropharyngeal exudate.  Cardiovascular: Normal rate, regular rhythm and normal heart sounds. Exam reveals no gallop and no friction rub.  No murmur heard.  Pulmonary/Chest: Effort normal and breath sounds normal. No respiratory distress.  has no wheezes.  Ext: right arm HD access. Right foot wrapped (see dr cain's picture) Abdominal: Soft. Bowel sounds are normal.  exhibits no distension. There is no tenderness.  Lymphadenopathy: no cervical adenopathy. No axillary adenopathy Neurological: alert and oriented to person, place, and time.  Skin: Skin is warm and dry. No rash noted. No erythema.  Psychiatric: a normal mood and affect.  behavior is normal.    Lab Results Recent Labs    08/13/20 0124 08/14/20 0135  WBC 20.5* 15.9*  HGB 7.4* 8.5*  HCT 24.3* 28.3*  NA 133* 131*  K 3.6 4.1  CL 93* 92*  CO2 29 24  BUN 26* 40*  CREATININE 5.42* 7.19*  Liver Panel Recent Labs    08/12/20 0852  ALBUMIN 2.1*   Lab Results  Component Value Date   ESRSEDRATE 92 (H) 08/11/2020    Microbiology: No tissue sent 4/17 blood cx NGTD 4/12 blood cx NGTD Studies/Results: No results found.   Assessment/Plan: Right foot osteo with gangrenous changes 2/2 critical limb ischemia = underwent source control with TMA on 4/15. Today will be last day of abtx, roughly stays in her system through 4/20.  Recommend to continue with wound vac to help with wound healing. We will still have her follow up in the ID clinic to see how her surgical site is  healing  Leukocytosis = continues to trend down since surgery. Continue to check cbc daily to ensure downtrend  Health maintenance, history of covid (omicron in Jan 2022) = will recommend that patient receive moderna booster vaccine prior to discharge  Will sign off.  [facility] for Infectious Diseases Cell: [phone] Pager: [phone]  08/14/2020, 11:18 AM

## 2020-08-14 NOTE — Progress Notes (Addendum)
PROGRESS NOTE    Christy Zhang  VXB:[id number] DOB: 11/12/1955 DOA: 08/08/2020 PCP: Claretta Fraise, MD   Chief Complaint  Patient presents with  . Foot Pain  Brief Narrative: 65 year old female with PMH of Chronic Hypertension, Uncontrolled Diabetes, ESRD on HD MWF, and Peripheral Vascular Disease s/p Left BKA who presents to the ED on 4/13 with worsening RLE wounds with necrotic tissue while on Cephalex.   Xray showed evidence of osteomyelitis.  ESR is 92 and CRP is 17.  On 4/14, angiogram showed critical limb ischemia.  On 4/15 Vascular Dr. Donzetta Matters performed open transmetatarsal amputation.  Vascular Surgery recommends continued close monitoring.  Subjective: Patient is doing well today. She completed dialysis. Wounds are healed. I spoke with Infectious Disease this am who recommend discontinuing antibiotics.  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  Vascular Surgery will evaluate wounds tomorrow and decide whether further surgeries are warranted. - Wound Cultures are NG.  Blood Cultures are NG. - Discontinue antibiotics per Infectious Disease, appreciate. - We will organize discharge plan tomorrow with wound vac in place, will discuss with Vascular Surgery and Case Management.  Patient will follow up with ID outpatient to assess wound healing and Vascular Surgery to assess for any other procedures.  Leukocytosis: - WBC improved from 20 to 15K. - Blood Cultures are NG x 24 hours.. - Continue antibiotics as above.  Peripheral Vascular Disease s/p 2/10 Left BKA: - Continue Aspirin and Statin. - Continue local wound care.  ESRD and HD MWF:  - Nephrology is following, appreciate.  Anemia of CKD: on ESA. Hyperparathyroidism: on Renvela and Hectorol.  Diabetes Mellitus Type 2 on long term insulin: A1C is 8.8%. - Glucose is stable today. - Continue Lantus 10 units and  Lispro SSi with POC glucose q6hrs.  Essential (primary) hypertension:  - BP is stable today. - Continue Metoprolol and Amlodipine.  Morbid obesity with BMI 38 kg/m2: - Counseled on weight loss through diet and lifestyle modification.  This is response to coding query. Stage 2 Pressure Ulcer located at left buttocks, POA: - Perform local wound care.   Diet Order            Diet renal/carb modified with fluid restriction Diet-HS Snack? Nothing; Fluid restriction: 1200 mL Fluid; Room service appropriate? Yes; Fluid consistency: Thin  Diet effective now                Patient's Body mass index is 39.05 kg/m. DVT prophylaxis: heparin injection 5,000 Units Start: 08/09/20 1400 Code Status:   Code Status: Full Code  Family Communication: plan of care discussed with patient at bedside.  Status is: Inpatient Remains inpatient appropriate because:IV treatments appropriate due to intensity of illness or inability to take PO and Inpatient level of care appropriate due to severity of illness remains hospitalized for ongoing management of gangrenous right foot.  Dispo: The patient is from: Home              Anticipated d/c is to: Patient is ready for discharge.  We will discuss with CM.              Patient currently is not medically stable to d/c.   Difficult to place patient No   Unresulted Labs (From admission, onward)          Start     Ordered   08/13/20 0500  CBC with Differential/Platelet  Daily,   R      08/12/20 0811   08/13/20 8502  Basic metabolic panel  Daily,   R      08/12/20 0811        Medications reviewed:  Scheduled Meds: . (feeding supplement) PROSource Plus  30 mL Oral BID BM  . amLODipine  5 mg Oral QHS  . aspirin EC  81 mg Oral QHS  . atorvastatin  40 mg Oral Daily  . Chlorhexidine Gluconate Cloth  6 each Topical Q0600  . clopidogrel  75 mg Oral QPM  . doxercalciferol  1.5 mcg Intravenous Q M,W,F-HD  . feeding supplement (NEPRO CARB STEADY)  237 mL Oral  Q24H  . gabapentin  300 mg Oral QHS  . heparin  5,000 Units Subcutaneous Q8H  . insulin aspart  0-6 Units Subcutaneous TID WC  . insulin glargine  10 Units Subcutaneous Daily  . metoprolol succinate  50 mg Oral Daily  . metroNIDAZOLE  500 mg Oral Q8H  . multivitamin  1 tablet Oral QHS  . polyethylene glycol  17 g Oral Daily  . senna-docusate  1 tablet Oral BID  . sevelamer carbonate  2,400 mg Oral TID WC  . sodium chloride flush  3 mL Intravenous Q12H   Continuous Infusions: . sodium chloride    . cefTRIAXone (ROCEPHIN)  IV 2 g (08/13/20 2041)    Consultants:see note  Procedures:see note  Antimicrobials: Anti-infectives (From admission, onward)   Start     Dose/Rate Route Frequency Ordered Stop   08/13/20 0000  vancomycin (VANCOCIN) IVPB 1000 mg/200 mL premix        1,000 mg 200 mL/hr over 60 Minutes Intravenous Every M-W-F (Hemodialysis) 08/10/20 1333 08/14/20 1140   08/12/20 1200  vancomycin (VANCOCIN) IVPB 1000 mg/200 mL premix  Status:  Discontinued        1,000 mg 200 mL/hr over 60 Minutes Intravenous Every T-Th-Sa (Hemodialysis) 08/10/20 1333 08/11/20 1319   08/12/20 1200  vancomycin (VANCOCIN) IVPB 1000 mg/200 mL premix        1,000 mg 200 mL/hr over 60 Minutes Intravenous Every Sat (Hemodialysis) 08/11/20 1319 08/12/20 1236   08/09/20 2200  cefTRIAXone (ROCEPHIN) 2 g in sodium chloride 0.9 % 100 mL IVPB        2 g 200 mL/hr over 30 Minutes Intravenous Every 24 hours 08/09/20 0054 08/14/20 2359   08/09/20 1200  vancomycin (VANCOREADY) IVPB 1000 mg/200 mL  Status:  Discontinued        1,000 mg 200 mL/hr over 60 Minutes Intravenous Every M-W-F (Hemodialysis) 08/08/20 2217 08/09/20 0812   08/09/20 1200  ceFEPIme (MAXIPIME) 2 g in sodium chloride 0.9 % 100 mL IVPB  Status:  Discontinued        2 g 200 mL/hr over 30 Minutes Intravenous Every M-W-F (Hemodialysis) 08/08/20 2217 08/09/20 0054   08/09/20 1200  vancomycin (VANCOCIN) IVPB 1000 mg/200 mL premix  Status:   Discontinued        1,000 mg 200 mL/hr over 60 Minutes Intravenous Every M-W-F (Hemodialysis) 08/09/20 0812 08/10/20 1333   08/09/20 0100  metroNIDAZOLE (FLAGYL) tablet 500 mg        500 mg Oral Every 8 hours 08/09/20 0054 08/14/20 2359   08/08/20 2200  vancomycin (VANCOREADY) IVPB 2000 mg/400 mL        2,000 mg 200 mL/hr over 120 Minutes Intravenous  Once 08/08/20 2145 08/09/20 0143   08/08/20 2200  ceFEPIme (MAXIPIME) 2 g in sodium chloride 0.9 % 100 mL  IVPB        2 g 200 mL/hr over 30 Minutes Intravenous  Once 08/08/20 2145 08/08/20 2333   08/08/20 2145  vancomycin (VANCOREADY) IVPB 1000 mg/200 mL  Status:  Discontinued        1,000 mg 200 mL/hr over 60 Minutes Intravenous  Once 08/08/20 2136 08/08/20 2145     Culture/Microbiology    Component Value Date/Time   SDES BLOOD RIGHT HAND 08/13/2020 0132   SPECREQUEST  08/13/2020 0132    BOTTLES DRAWN AEROBIC ONLY Blood Culture adequate volume   CULT  08/13/2020 0132    NO GROWTH 1 DAY Performed at [facility], [place] [address] [place], [place] [address]    REPTSTATUS PENDING 08/13/2020 0132    Other culture-see note  Objective: Vitals: Today's Vitals   08/14/20 1150 08/14/20 1220 08/14/20 1253 08/14/20 1344  BP:  (!) 151/56 (!) 138/59   Pulse:  73 74   Resp:  18    Temp: 98.1 F (36.7 C) 98.2 F (36.8 C)    TempSrc: Oral Oral    SpO2:  93%    Weight: 100 kg     Height:      PainSc: 0-No pain   8     Intake/Output Summary (Last 24 hours) at 08/14/2020 1425 Last data filed at 08/14/2020 1345 Gross per 24 hour  Intake 717.07 ml  Output 2000 ml  Net -1282.93 ml   Filed Weights   08/14/20 0451 08/14/20 0730 08/14/20 1150  Weight: 101.2 kg 102 kg 100 kg   Weight change: 1.6 kg  Intake/Output from previous day: 04/17 0701 - 04/18 0700 In: 840.1 [P.O.:740; IV Piggyback:100.1] Out: -  Intake/Output this shift: Total I/O In: 117 [P.O.:117] Out: 2000 [Other:2000] Filed Weights   08/14/20 0451 08/14/20 0730  08/14/20 1150  Weight: 101.2 kg 102 kg 100 kg    Examination: General exam: AAOx3, pleasant, NAD, weak appearing. HEENT:Oral mucosa moist, Ear/Nose WNL grossly, dentition normal. Respiratory system: bilaterally clear,no wheezing or crackles,no use of accessory muscle Cardiovascular system: S1 & S2 +, No JVD,. Gastrointestinal system: Abdomen soft, NT,ND, BS+ Nervous System:Alert, awake, moving extremities and grossly nonfocal Extremities: No edema, recent left BKA, right foot TMA with dressing in place Skin: No rashes,no icterus. MSK: Normal muscle bulk,tone, power  Data Reviewed: I have personally reviewed following labs and imaging studies CBC: Recent Labs  Lab 08/08/20 1855 08/09/20 0348 08/10/20 0355 08/11/20 0245 08/12/20 0853 08/13/20 0124 08/14/20 0135  WBC 18.3*   < > 16.5* 15.5* 24.5* 20.5* 15.9*  NEUTROABS 14.3*  --   --   --   --  15.9* 11.4*  HGB 9.2*   < > 8.9* 8.1* 7.6* 7.4* 8.5*  HCT 31.3*   < > 29.7* 27.0* 26.1* 24.3* 28.3*  MCV 84.6   < > 82.5 82.3 84.7 81.8 82.5  PLT 894*   < > 762* 751* 722* 653* 639*   < > = values in this interval not displayed.   Basic Metabolic Panel: Recent Labs  Lab 08/08/20 1855 08/09/20 0348 08/10/20 0355 08/11/20 0245 08/12/20 0852 08/13/20 0124 08/14/20 0135  NA 136   < > 135 132* 133* 133* 131*  K 4.7   < > 3.8 3.9 4.2 3.6 4.1  CL 96*   < > 97* 96* 97* 93* 92*  CO2 26   < > $R'28 26 23 29 24  'UN$ GLUCOSE 166*   < > 114* 149* 237* 152* 108*  BUN 20   < >  15 32* 53* 26* 40*  CREATININE 7.67*   < > 5.23* 7.01* 8.76* 5.42* 7.19*  CALCIUM 8.9   < > 8.7* 8.5* 8.9 8.5* 8.4*  MG 1.9  --   --   --   --   --   --   PHOS  --   --   --   --  5.2*  --   --    < > = values in this interval not displayed.   GFR: Estimated Creatinine Clearance: 9 mL/min (A) (by C-G formula based on SCr of 7.19 mg/dL (H)). Liver Function Tests: Recent Labs  Lab 08/08/20 1855 08/12/20 0852  AST 10*  --   ALT 7  --   ALKPHOS 63  --   BILITOT 0.6   --   PROT 8.0  --   ALBUMIN 2.6* 2.1*   No results for input(s): LIPASE, AMYLASE in the last 168 hours. No results for input(s): AMMONIA in the last 168 hours. Coagulation Profile: Recent Labs  Lab 08/10/20 0355  INR 1.2   Cardiac Enzymes: No results for input(s): CKTOTAL, CKMB, CKMBINDEX, TROPONINI in the last 168 hours. BNP (last 3 results) No results for input(s): PROBNP in the last 8760 hours. HbA1C: No results for input(s): HGBA1C in the last 72 hours. CBG: Recent Labs  Lab 08/13/20 1123 08/13/20 1549 08/13/20 2135 08/14/20 0612 08/14/20 1230  GLUCAP 130* 144* 116* 116* 104*   Lipid Profile: No results for input(s): CHOL, HDL, LDLCALC, TRIG, CHOLHDL, LDLDIRECT in the last 72 hours. Thyroid Function Tests: No results for input(s): TSH, T4TOTAL, FREET4, T3FREE, THYROIDAB in the last 72 hours. Anemia Panel: No results for input(s): VITAMINB12, FOLATE, FERRITIN, TIBC, IRON, RETICCTPCT in the last 72 hours. Sepsis Labs: Recent Labs  Lab 08/08/20 1855  LATICACIDVEN 1.4    Recent Results (from the past 240 hour(s))  Blood culture (routine x 2)     Status: None   Collection Time: 08/08/20  8:44 PM   Specimen: BLOOD  Result Value Ref Range Status   Specimen Description BLOOD SITE NOT SPECIFIED  Final   Special Requests   Final    BOTTLES DRAWN AEROBIC AND ANAEROBIC Blood Culture results may not be optimal due to an inadequate volume of blood received in culture bottles   Culture   Final    NO GROWTH 5 DAYS Performed at [facility], [address] [place], [place] [address]    Report Status 08/13/2020 FINAL  Final  SARS CORONAVIRUS 2 (TAT 6-24 HRS) Nasopharyngeal Nasopharyngeal Swab     Status: None   Collection Time: 08/08/20 10:31 PM   Specimen: Nasopharyngeal Swab  Result Value Ref Range Status   SARS Coronavirus 2 NEGATIVE NEGATIVE Final    Comment: (NOTE) SARS-CoV-2 target nucleic acids are NOT DETECTED.  The SARS-CoV-2 RNA is generally detectable  in upper and lower respiratory specimens during the acute phase of infection. Negative results do not preclude SARS-CoV-2 infection, do not rule out co-infections with other pathogens, and should not be used as the sole basis for treatment or other patient management decisions. Negative results must be combined with clinical observations, patient history, and epidemiological information. The expected result is Negative.  Fact Sheet for Patients: SugarRoll.be  Fact Sheet for Healthcare Providers: https://www.woods-mathews.com/  This test is not yet approved or cleared by the [place] FDA and  has been authorized for detection and/or diagnosis of SARS-CoV-2 by FDA under an Emergency Use Authorization (EUA). This EUA will remain  in effect (meaning this test can be used) for the duration of the COVID-19 declaration under Se ction 564(b)(1) of the Act, 21 U.S.C. section 360bbb-3(b)(1), unless the authorization is terminated or revoked sooner.  Performed at [facility], [place] [address], [place], [place] [address]   Surgical pcr screen     Status: None   Collection Time: 08/10/20  9:59 PM   Specimen: Nasal Mucosa; Nasal Swab  Result Value Ref Range Status   MRSA, PCR NEGATIVE NEGATIVE Final   Staphylococcus aureus NEGATIVE NEGATIVE Final    Comment: (NOTE) The Xpert SA Assay (FDA approved for NASAL specimens in patients 12 years of age and older), is one component of a comprehensive surveillance program. It is not intended to diagnose infection nor to guide or monitor treatment. Performed at [facility], [place] [address], [place], [place] [address]   Culture, blood (routine x 2)     Status: None (Preliminary result)   Collection Time: 08/13/20  1:24 AM   Specimen: BLOOD LEFT ARM  Result Value Ref Range Status   Specimen Description BLOOD LEFT ARM  Final   Special Requests   Final    BOTTLES DRAWN AEROBIC ONLY Blood  Culture adequate volume   Culture   Final    NO GROWTH 1 DAY Performed at [facility], [place] [address] [place], [place] [address]    Report Status PENDING  Incomplete  Culture, blood (routine x 2)     Status: None (Preliminary result)   Collection Time: 08/13/20  1:32 AM   Specimen: BLOOD RIGHT HAND  Result Value Ref Range Status   Specimen Description BLOOD RIGHT HAND  Final   Special Requests   Final    BOTTLES DRAWN AEROBIC ONLY Blood Culture adequate volume   Culture   Final    NO GROWTH 1 DAY Performed at [facility], [place] [address], [place], [place] [address]    Report Status PENDING  Incomplete     Radiology Studies: No results found.   LOS: 6 days   George Hugh, MD [facility]  08/14/2020, 2:25 PM

## 2020-08-14 NOTE — Telephone Encounter (Signed)
error 

## 2020-08-14 NOTE — Consult Note (Signed)
[facility] Nurse Consult Note: Patient receiving care in [place]. Reason for Consult: NPWT application to Right TMA Wound type: surgical Pressure Injury POA: Yes/No/NA Measurement: 23 cm x 4 cm x 2.2 cm Wound bed: 100% pink, clean Drainage (amount, consistency, odor) to be determined Periwound: intact Dressing procedure/placement/frequency: one piece of black foam placed over wound bed. Drape applied, immediate seal obtained. Patient tolerated procedure without pain and without pre-medication for pain. Val Riles, RN, MSN, CWOCN, CNS-BC, pager [phone]

## 2020-08-14 NOTE — Anesthesia Postprocedure Evaluation (Signed)
Anesthesia Post Note  Patient: Christy Zhang  Procedure(s) Performed: RIGHT TRANSMETATARSAL AMPUTATION (Right Foot)     Patient location during evaluation: PACU Anesthesia Type: Regional Level of consciousness: awake and alert Pain management: pain level controlled Vital Signs Assessment: post-procedure vital signs reviewed and stable Respiratory status: spontaneous breathing Cardiovascular status: stable Anesthetic complications: no   No complications documented.  Last Vitals:  Vitals:   08/14/20 0310 08/14/20 0311  BP: (!) 145/59 (!) 145/59  Pulse: 71 66  Resp: 17 15  Temp: (!) 36.4 C   SpO2: 96% 96%    Last Pain:  Vitals:   08/14/20 0531  TempSrc:   PainSc: Asleep                 [name]

## 2020-08-14 NOTE — Plan of Care (Incomplete)
Plan of care is reviewed. Pt has been progressing. Problem: Clinical Measurements: Goal: Ability to maintain clinical measurements within normal limits will improve Outcome: Progressing   Problem: Clinical Measurements: Goal: Will remain free from infection Outcome: Progressing   Problem: Clinical Measurements: Goal: Respiratory complications will improve Outcome: Progressing   Problem: Clinical Measurements: Goal: Cardiovascular complication will be avoided Outcome: Progressing   Problem: Nutrition: Goal: Adequate nutrition will be maintained Outcome: Progressing   Problem: Activity: Goal: Risk for activity intolerance will decrease Outcome: Progressing   Problem: Elimination: Goal: Will not experience complications related to bowel motility Outcome: Progressing   Problem: Pain Managment: Goal: General experience of comfort will improve Outcome: Progressing   Problem: Skin Integrity: Goal: Risk for impaired skin integrity will decrease Outcome: Progressing

## 2020-08-14 NOTE — Telephone Encounter (Signed)
-----   Message from Carlyle Basques, MD sent at 08/14/2020 11:30 AM EDT ----- Can you see if Collene Mares can see her in 2-3 wk or myself

## 2020-08-14 NOTE — Progress Notes (Signed)
[facility] Progress Note   Assessment/ Plan:   Christy Zhang is a 30 yof s/p amputation  ESRD    1. Osteomyelitis RLE with gangrene - R transmetatarsal amputation 4/15 - Managed by vascular surgery, likely wound vac placement  2. ESRD: - MWF outpatient schedule, plan to continue as inpatient. - Last HD tx 4/16 completed; net UF 2000 mL (at goal)  3. Hypertension: - Continue home metoprolol and Norvasc  4. Anemia of ESRD: - Aranesp 200 q Fri - Not candidate for IV iron with active infection. WBC improving but still elevated. - Transfusion if Hgb < 7  5. MBD of ESRD: - Continue renvela and hectorol   Subjective:   Patient evaluated today during HD. She states she feels well, although sleepy and is attempting to nap. She reports no discomfort of surgical site and has no complaints.    Objective:   BP (!) 110/46   Pulse 65   Temp 98.5 F (36.9 C) (Oral)   Resp 10   Ht '5\' 3"'$  (1.6 m)   Wt 102 kg   LMP  (LMP Unknown)   SpO2 97%   BMI 39.83 kg/m   Physical Exam: [id number] in hospital bed, in NAD CVS: RRR, no murmurs or rubs Resp: Unlabored; BS clear & equal bilaterally Abd: Soft, nontender Ext: Left BKA; right foot bandaged Access: RUA AVG, good bruit, currently being accessed without issue.  Labs: BMET Recent Labs  Lab 08/08/20 1855 08/09/20 0348 08/10/20 0355 08/11/20 0245 08/12/20 0852 08/13/20 0124 08/14/20 0135  NA 136 135 135 132* 133* 133* 131*  K 4.7 4.3 3.8 3.9 4.2 3.6 4.1  CL 96* 98 97* 96* 97* 93* 92*  CO2 '26 25 28 26 23 29 24  '$ GLUCOSE 166* 93 114* 149* 237* 152* 108*  BUN '20 22 15 '$ 32* 53* 26* 40*  CREATININE 7.67* 8.25* 5.23* 7.01* 8.76* 5.42* 7.19*  CALCIUM 8.9 8.7* 8.7* 8.5* 8.9 8.5* 8.4*  PHOS  --   --   --   --  5.2*  --   --    CBC Recent Labs  Lab 08/08/20 1855 08/09/20 0348 08/11/20 0245 08/12/20 0853 08/13/20 0124 08/14/20 0135  WBC 18.3*   < > 15.5* 24.5* 20.5* 15.9*  NEUTROABS 14.3*  --   --   --   15.9* 11.4*  HGB 9.2*   < > 8.1* 7.6* 7.4* 8.5*  HCT 31.3*   < > 27.0* 26.1* 24.3* 28.3*  MCV 84.6   < > 82.3 84.7 81.8 82.5  PLT 894*   < > 751* 722* 653* 639*   < > = values in this interval not displayed.      Medications:    . (feeding supplement) PROSource Plus  30 mL Oral BID BM  . amLODipine  5 mg Oral QHS  . aspirin EC  81 mg Oral QHS  . atorvastatin  40 mg Oral Daily  . Chlorhexidine Gluconate Cloth  6 each Topical Q0600  . clopidogrel  75 mg Oral QPM  . doxercalciferol  1.5 mcg Intravenous Q M,W,F-HD  . feeding supplement (NEPRO CARB STEADY)  237 mL Oral Q24H  . gabapentin  300 mg Oral QHS  . heparin  5,000 Units Subcutaneous Q8H  . insulin aspart  0-6 Units Subcutaneous TID WC  . insulin glargine  10 Units Subcutaneous Daily  . metoprolol succinate  50 mg Oral Daily  . metroNIDAZOLE  500 mg Oral Q8H  . multivitamin  1 tablet Oral  QHS  . polyethylene glycol  17 g Oral Daily  . senna-docusate  1 tablet Oral BID  . sevelamer carbonate  2,400 mg Oral TID WC  . sodium chloride flush  3 mL Intravenous Q12H     Bobbi J Hurst 08/14/2020, 9:10 AM   Patient seen on dialysis and tolerating treatment

## 2020-08-14 NOTE — Progress Notes (Addendum)
VASCULAR SURGERY ASSESSMENT & PLAN:   3 Days Post-Op Critical limb ischemia of the right lower extremity with tissue loss/gangrene of right toes. Plain films were suspicious for osteo of distal phalanx of the second digit and the cut end of the residual third proximal phalanx and possible additional erosive change involving the bases of the second and third proximal phalanges and head of third metatarsal. Now status post right TMA.   Afebrile. Dressing dry and intact. Consult wound care RN for wound VAC placement. Plavix, statin and asa continue.  Leukocytosis trending down: today is 15.9. On vancomycin, ceftriaxone and metronidazole.  TOC team consulted for [facility], home wound VAC.  I have interviewed the patient and examined the patient. I agree with the findings by the PA. Pt is still in HD. I will look at the wound tomorrow.   Gae Gallop, MD [phone]   SUBJECTIVE:   Patient seen in inpatient HD unit. Treatment ongoing without apparent complications. Post-op pain controlled.  PHYSICAL EXAM:   Vitals:   08/14/20 0800 08/14/20 0830 08/14/20 0900 08/14/20 0930  BP: (!) 141/55 (!) 110/47 (!) 110/46 (!) 109/48  Pulse:   65   Resp:   10   Temp:      TempSrc:      SpO2:   97%   Weight:      Height:       General appearance: Awake, alert in no apparent distress Cardiac: Heart rate and rhythm are regular Respirations: Nonlabored Extremities: Right foot dressing dry and intact. Did not remove as pt is getting dialyzed. Right foot is warm.  LABS:   Lab Results  Component Value Date   WBC 15.9 (H) 08/14/2020   HGB 8.5 (L) 08/14/2020   HCT 28.3 (L) 08/14/2020   MCV 82.5 08/14/2020   PLT 639 (H) 08/14/2020   Lab Results  Component Value Date   CREATININE 7.19 (H) 08/14/2020   Lab Results  Component Value Date   INR 1.2 08/10/2020   CBG (last 3)  Recent Labs    08/13/20 1549 08/13/20 2135 08/14/20 0612  GLUCAP 144* 116* 116*    PROBLEM LIST:    Principal  Problem:   Osteomyelitis of right foot ([place]) Active Problems:   ESRD (end stage renal disease) ([place])   Controlled type 2 diabetes mellitus with chronic kidney disease on chronic dialysis, with long-term current use of insulin (HCC)   Essential (primary) hypertension   Thrombocytosis   Pressure injury of skin   Diabetic foot infection ([place])   Medication monitoring encounter   CURRENT MEDS:   . (feeding supplement) PROSource Plus  30 mL Oral BID BM  . amLODipine  5 mg Oral QHS  . aspirin EC  81 mg Oral QHS  . atorvastatin  40 mg Oral Daily  . Chlorhexidine Gluconate Cloth  6 each Topical Q0600  . clopidogrel  75 mg Oral QPM  . doxercalciferol  1.5 mcg Intravenous Q M,W,F-HD  . feeding supplement (NEPRO CARB STEADY)  237 mL Oral Q24H  . gabapentin  300 mg Oral QHS  . heparin  5,000 Units Subcutaneous Q8H  . insulin aspart  0-6 Units Subcutaneous TID WC  . insulin glargine  10 Units Subcutaneous Daily  . metoprolol succinate  50 mg Oral Daily  . metroNIDAZOLE  500 mg Oral Q8H  . multivitamin  1 tablet Oral QHS  . polyethylene glycol  17 g Oral Daily  . senna-docusate  1 tablet Oral BID  . sevelamer carbonate  2,400 mg Oral TID WC  . sodium chloride flush  3 mL Intravenous Q12H   Barbie Banner, [place]  Office: [phone] 08/14/2020

## 2020-08-14 NOTE — Procedures (Signed)
I have seen and examined this patient and agree with the plan of care. ESRD receiving dialysis   Sherril Croon 08/14/2020, 11:50 AM

## 2020-08-15 ENCOUNTER — Telehealth: Payer: Medicare Other

## 2020-08-15 DIAGNOSIS — M86671 Other chronic osteomyelitis, right ankle and foot: Secondary | ICD-10-CM | POA: Diagnosis not present

## 2020-08-15 LAB — CBC WITH DIFFERENTIAL/PLATELET
Abs Immature Granulocytes: 0.13 10*3/uL — ABNORMAL HIGH (ref 0.00–0.07)
Basophils Absolute: 0.1 10*3/uL (ref 0.0–0.1)
Basophils Relative: 1 %
Eosinophils Absolute: 0.5 10*3/uL (ref 0.0–0.5)
Eosinophils Relative: 4 %
HCT: 25.9 % — ABNORMAL LOW (ref 36.0–46.0)
Hemoglobin: 7.7 g/dL — ABNORMAL LOW (ref 12.0–15.0)
Immature Granulocytes: 1 %
Lymphocytes Relative: 16 %
Lymphs Abs: 2.2 10*3/uL (ref 0.7–4.0)
MCH: 24.4 pg — ABNORMAL LOW (ref 26.0–34.0)
MCHC: 29.7 g/dL — ABNORMAL LOW (ref 30.0–36.0)
MCV: 82.2 fL (ref 80.0–100.0)
Monocytes Absolute: 0.9 10*3/uL (ref 0.1–1.0)
Monocytes Relative: 6 %
Neutro Abs: 10.7 10*3/uL — ABNORMAL HIGH (ref 1.7–7.7)
Neutrophils Relative %: 72 %
Platelets: 671 10*3/uL — ABNORMAL HIGH (ref 150–400)
RBC: 3.15 MIL/uL — ABNORMAL LOW (ref 3.87–5.11)
RDW: 18.5 % — ABNORMAL HIGH (ref 11.5–15.5)
WBC: 14.5 10*3/uL — ABNORMAL HIGH (ref 4.0–10.5)
nRBC: 0 % (ref 0.0–0.2)

## 2020-08-15 LAB — GLUCOSE, CAPILLARY
Glucose-Capillary: 127 mg/dL — ABNORMAL HIGH (ref 70–99)
Glucose-Capillary: 185 mg/dL — ABNORMAL HIGH (ref 70–99)
Glucose-Capillary: 146 mg/dL — ABNORMAL HIGH (ref 70–99)
Glucose-Capillary: 161 mg/dL — ABNORMAL HIGH (ref 70–99)

## 2020-08-15 LAB — BASIC METABOLIC PANEL
Anion gap: 11 (ref 5–15)
BUN: 28 mg/dL — ABNORMAL HIGH (ref 8–23)
CO2: 27 mmol/L (ref 22–32)
Calcium: 8.5 mg/dL — ABNORMAL LOW (ref 8.9–10.3)
Chloride: 94 mmol/L — ABNORMAL LOW (ref 98–111)
Creatinine, Ser: 5.87 mg/dL — ABNORMAL HIGH (ref 0.44–1.00)
GFR, Estimated: 8 mL/min — ABNORMAL LOW (ref >60)
Glucose, Bld: 130 mg/dL — ABNORMAL HIGH (ref 70–99)
Potassium: 4 mmol/L (ref 3.5–5.1)
Sodium: 132 mmol/L — ABNORMAL LOW (ref 135–145)

## 2020-08-15 MED ORDER — COVID-19 MRNA VACC (MODERNA) 50 MCG/0.25ML IM SUSP
0.2500 mL | Freq: Once | INTRAMUSCULAR | Status: AC
  Administered 2020-08-15: 0.25 mL via INTRAMUSCULAR
  Filled 2020-08-15: qty 0.25

## 2020-08-15 MED ORDER — CHLORHEXIDINE GLUCONATE CLOTH 2 % EX PADS
6.0000 | MEDICATED_PAD | Freq: Every day | CUTANEOUS | Status: DC
  Administered 2020-08-15 – 2020-08-16 (×2): 6 via TOPICAL

## 2020-08-15 NOTE — Progress Notes (Addendum)
   VASCULAR SURGERY ASSESSMENT & PLAN:   POD 4 RIGHT TMA: VAC in place. Brisk ATA signal with doppler. That is her only runoff.  VAC change [place]  PTx [place] shoe right foot. PWB heel only right foot.   SUBJECTIVE:   No complaints  PHYSICAL EXAM:   Vitals:   08/14/20 1535 08/14/20 1957 08/15/20 0020 08/15/20 0610  BP: 123/61 (!) 130/57 (!) 136/54   Pulse: 67 71 69   Resp: '18 16 15 13  '$ Temp: 98.5 F (36.9 C) 98.5 F (36.9 C) 98.9 F (37.2 C) 98.7 F (37.1 C)  TempSrc: Oral Oral Oral Oral  SpO2: 97% 96% 97%   Weight:      Height:       VAC in place R TMA Brisk ATA signal right LE  LABS:   Lab Results  Component Value Date   WBC 14.5 (H) 08/15/2020   HGB 7.7 (L) 08/15/2020   HCT 25.9 (L) 08/15/2020   MCV 82.2 08/15/2020   PLT 671 (H) 08/15/2020   Lab Results  Component Value Date   CREATININE 5.87 (H) 08/15/2020   Lab Results  Component Value Date   INR 1.2 08/10/2020   CBG (last 3)  Recent Labs    08/14/20 1643 08/14/20 2127 08/15/20 0612  GLUCAP 122* 186* 127*    PROBLEM LIST:    Principal Problem:   Osteomyelitis of right foot (HCC) Active Problems:   ESRD (end stage renal disease) ([place])   Controlled type 2 diabetes mellitus with chronic kidney disease on chronic dialysis, with long-term current use of insulin (HCC)   Essential (primary) hypertension   Thrombocytosis   Pressure injury of skin   Diabetic foot infection ([place])   Medication monitoring encounter   CURRENT MEDS:   . (feeding supplement) PROSource Plus  30 mL Oral BID BM  . amLODipine  5 mg Oral QHS  . aspirin EC  81 mg Oral QHS  . atorvastatin  40 mg Oral Daily  . Chlorhexidine Gluconate Cloth  6 each Topical Q0600  . clopidogrel  75 mg Oral QPM  . doxercalciferol  1.5 mcg Intravenous Q M,W,F-HD  . feeding supplement (NEPRO CARB STEADY)  237 mL Oral Q24H  . gabapentin  300 mg Oral QHS  . heparin  5,000 Units Subcutaneous Q8H  . insulin aspart  0-6 Units Subcutaneous TID WC   . insulin glargine  10 Units Subcutaneous Daily  . metoprolol succinate  50 mg Oral Daily  . multivitamin  1 tablet Oral QHS  . polyethylene glycol  17 g Oral Daily  . senna-docusate  1 tablet Oral BID  . sevelamer carbonate  2,400 mg Oral TID WC  . sodium chloride flush  3 mL Intravenous Q12H    Deitra Mayo Office: [phone] 08/15/2020

## 2020-08-15 NOTE — Progress Notes (Signed)
Nutrition Follow Up  DOCUMENTATION CODES:   Not applicable  INTERVENTION:    ProSource Plus 30 ml BID, each supplement provides 100 kcals and 15 grams protein.   Nepro Shake po once daily, each supplement provides 425 kcal and 19 grams protein  Renal MVI  NUTRITION DIAGNOSIS:   Increased nutrient needs related to wound healing as evidenced by estimated needs.  Ongoing  GOAL:   Patient will meet greater than or equal to 90% of their needs   Progressing   MONITOR:   PO intake,Supplement acceptance,Weight trends,Labs,I & O's  REASON FOR ASSESSMENT:   Consult Wound healing  ASSESSMENT:   Patient with PMH significant for ESRD on HD, CHF, HLD, DM, HTN, PAD, s/p L BKA, and R foot infection. Presents this admission with R foot gangrene.   4/14- s/p aortogram, R tibial artery angioplasty 4/14- s/p open R TMA  Patient endorses great appetite. Last 6 meal completions charted as 75-100%. Taking ProSource BID and Nepro shake once daily. Would like to continue both supplements.   Admission weight: 97 kg  Current weight: 100 kg   Medications: hectorol, SS novolog, lantus, miralax, rena-vit, senokot, renvela Labs: Na 132 (L) CBG 104-186  Diet Order:   Diet Order            Diet renal/carb modified with fluid restriction Diet-HS Snack? Nothing; Fluid restriction: 1200 mL Fluid; Room service appropriate? Yes; Fluid consistency: Thin  Diet effective now                 EDUCATION NEEDS:   Education needs have been addressed  Skin:  Skin Assessment: Skin Integrity Issues: Skin Integrity Issues:: Stage II,Incisions Stage II: buttocks Incisions: R foot  Last BM:  4/16  Height:   Ht Readings from Last 1 Encounters:  08/08/20 '5\' 3"'$  (1.6 m)    Weight:   Wt Readings from Last 1 Encounters:  08/14/20 100 kg    BMI:  Body mass index is 39.05 kg/m.  Estimated Nutritional Needs:   Kcal:  2100-2300 kcal  Protein:  105-120 grams  Fluid:  1200 ml +  UOP  Mariana Single RD, LDN Clinical Nutrition Pager listed in [place]

## 2020-08-15 NOTE — Progress Notes (Addendum)
[facility] Progress Note  I have seen and examined this patient and agree with the plan of care. Plan dialysis in 4/19  Sherril Croon 08/15/2020, 11:27 AM  Assessment/ Plan:   Christy Zhang is a 83 yof s/p amputation with ESRD treated with outpatient HD  1. Osteomyelitis RLE with gangrene - R transmetatarsal amputation 4/15 - Managed by vascular surgery, likely wound vac placement  2. ESRD: - MWF outpatient schedule, plan to continue as inpatient. - Last HD tx 4/18 completed without issue; net UF 2000 mL (at goal)  3. Hypertension: - Continue home metoprolol and Norvasc  4. Anemia of ESRD: - Aranesp 200 q Fri - Not candidate for IV iron with active infection. WBC improving but still elevated. - Transfusion if Hgb < 7  5. MBD of ESRD: - Continue renvela and hectorol  Subjective:   Per chart review, RLE surgical wound is healing and antibiotics have been discontinued (NG on cultures). Vascular surgery evaluation planned for today.  At time of patient exam this morning she was lying in the hospital bed watching videos on her phone. She states she is feeling well, has no complaints and expresses excitement at her progress re: surgical wound.    Objective:   BP (!) 136/54 (BP Location: Left Arm)   Pulse 69   Temp 98.7 F (37.1 C) (Oral)   Resp 13   Ht '5\' 3"'$  (1.6 m)   Wt 100 kg   LMP  (LMP Unknown)   SpO2 97%   BMI 39.05 kg/m   Physical Exam: [id number] in hospital bed, in NAD CVS: RRR, no murmurs or rubs Resp: Unlabored; BS clear & equal bilaterally Abd: Soft, nontender Ext: Left BKA; right foot bandaged Access: RUA AVG, good bruit.  Labs: BMET Recent Labs  Lab 08/09/20 0348 08/10/20 0355 08/11/20 0245 08/12/20 0852 08/13/20 0124 08/14/20 0135 08/15/20 0644  NA 135 135 132* 133* 133* 131* 132*  K 4.3 3.8 3.9 4.2 3.6 4.1 4.0  CL 98 97* 96* 97* 93* 92* 94*  CO2 '25 28 26 23 29 24 27  '$ GLUCOSE 93 114* 149* 237* 152* 108* 130*  BUN 22  15 32* 53* 26* 40* 28*  CREATININE 8.25* 5.23* 7.01* 8.76* 5.42* 7.19* 5.87*  CALCIUM 8.7* 8.7* 8.5* 8.9 8.5* 8.4* 8.5*  PHOS  --   --   --  5.2*  --   --   --    CBC Recent Labs  Lab 08/08/20 1855 08/09/20 0348 08/12/20 0853 08/13/20 0124 08/14/20 0135 08/15/20 0215  WBC 18.3*   < > 24.5* 20.5* 15.9* 14.5*  NEUTROABS 14.3*  --   --  15.9* 11.4* 10.7*  HGB 9.2*   < > 7.6* 7.4* 8.5* 7.7*  HCT 31.3*   < > 26.1* 24.3* 28.3* 25.9*  MCV 84.6   < > 84.7 81.8 82.5 82.2  PLT 894*   < > 722* 653* 639* 671*   < > = values in this interval not displayed.      Medications:    . (feeding supplement) PROSource Plus  30 mL Oral BID BM  . amLODipine  5 mg Oral QHS  . aspirin EC  81 mg Oral QHS  . atorvastatin  40 mg Oral Daily  . Chlorhexidine Gluconate Cloth  6 each Topical Q0600  . clopidogrel  75 mg Oral QPM  . doxercalciferol  1.5 mcg Intravenous Q M,W,F-HD  . feeding supplement (NEPRO CARB STEADY)  237 mL Oral Q24H  .  gabapentin  300 mg Oral QHS  . heparin  5,000 Units Subcutaneous Q8H  . insulin aspart  0-6 Units Subcutaneous TID WC  . insulin glargine  10 Units Subcutaneous Daily  . metoprolol succinate  50 mg Oral Daily  . multivitamin  1 tablet Oral QHS  . polyethylene glycol  17 g Oral Daily  . senna-docusate  1 tablet Oral BID  . sevelamer carbonate  2,400 mg Oral TID WC  . sodium chloride flush  3 mL Intravenous Q12H     Bobbi J Hurst 08/15/2020, 8:09 AM

## 2020-08-15 NOTE — Care Management Important Message (Signed)
Important Message  Patient Details  Name: Christy Zhang MRN: [id number] Date of Birth: [birth year]   Medicare Important Message Given:  Yes     Shelda Altes 08/15/2020, 12:22 PM

## 2020-08-15 NOTE — Progress Notes (Addendum)
PROGRESS NOTE    Christy Zhang  ZOX:[id number] DOB: 10/10/1955 DOA: 08/08/2020 PCP: Claretta Fraise, MD   Chief Complaint  Patient presents with  . Foot Pain  Brief Narrative: 65 year old female with PMH of Chronic Hypertension, Uncontrolled Diabetes, ESRD on HD MWF, and Peripheral Vascular Disease s/p Left BKA who presents to the ED on 4/13 with worsening RLE wounds with necrotic tissue while on Cephalex.   Xray showed evidence of osteomyelitis.  ESR is 92 and CRP is 17.  On 4/14, angiogram showed critical limb ischemia.  On 4/15 Vascular Dr. Donzetta Matters performed open transmetatarsal amputation.  Vascular Surgery recommends continued close monitoring.  Subjective: Patient is doing very well. Wounds look ok. Wound vac is in place. We discussed discharge plan. Patient states she is ready to go home. Will discuss with Vascular Surgery, appreciate.  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  Vascular Surgery will evaluate wounds tomorrow and decide whether further surgeries are warranted. - Wound Cultures are NG.  Blood Cultures are NG. - Antibiotics were discontinued per Infectious Disease, appreciate. - We will organize discharge plan tomorrow with wound vac in place, will discuss with Vascular Surgery and Case Management.  Patient will follow up with ID outpatient to assess wound healing and Vascular Surgery to assess for any further procedures.  Leukocytosis: - WBC improved from 20 to 15K. - Blood Cultures are NG x 24 hours.. - Continue antibiotics as above.  Peripheral Vascular Disease s/p 2/10 Left BKA: - Continue Aspirin and Statin. - Continue local wound care.  ESRD and HD MWF:  - Nephrology is following, appreciate.  Anemia of CKD: on ESA. Hyperparathyroidism: on Renvela and Hectorol.  Diabetes Mellitus Type 2 on long term insulin: A1C is 8.8%. - Glucose is  stable today. - Continue Lantus 10 units and Lispro SSi with POC glucose q6hrs.  Essential (primary) hypertension:  - BP is stable today. - Continue Metoprolol and Amlodipine.  Morbid obesity with BMI 38 kg/m2: - Counseled on weight loss through diet and lifestyle modification.  Stage 2 Pressure Ulcer located at left buttocks, POA: - Perform local wound care.   Diet Order            Diet renal/carb modified with fluid restriction Diet-HS Snack? Nothing; Fluid restriction: 1200 mL Fluid; Room service appropriate? Yes; Fluid consistency: Thin  Diet effective now                Patient's Body mass index is 39.05 kg/m. DVT prophylaxis: heparin injection 5,000 Units Start: 08/09/20 1400 Code Status:   Code Status: Full Code  Family Communication: plan of care discussed with patient at bedside.  Status is: Inpatient Remains inpatient appropriate because:IV treatments appropriate due to intensity of illness or inability to take PO and Inpatient level of care appropriate due to severity of illness remains hospitalized for ongoing management of gangrenous right foot.  Dispo: The patient is from: Home              Anticipated d/c is to: Patient is ready for discharge.  We will discuss with CM.              Patient currently is not medically stable to d/c.   Difficult to place patient No   Unresulted Labs (From admission, onward)          Start     Ordered   08/13/20 0500  CBC with Differential/Platelet  Daily,   R      08/12/20 0811   08/13/20 8250  Basic metabolic panel  Daily,   R      08/12/20 0811   Signed and Held  Renal function panel  Once,   R       Question:  Specimen collection method  Answer:  Lab=Lab collect   Signed and Held   Signed and Held  CBC  Once,   R       Question:  Specimen collection method  Answer:  Lab=Lab collect   Signed and Held        Medications reviewed:  Scheduled Meds: . (feeding supplement) PROSource Plus  30 mL Oral BID BM  . amLODipine   5 mg Oral QHS  . aspirin EC  81 mg Oral QHS  . atorvastatin  40 mg Oral Daily  . Chlorhexidine Gluconate Cloth  6 each Topical Q0600  . clopidogrel  75 mg Oral QPM  . doxercalciferol  1.5 mcg Intravenous Q M,W,F-HD  . feeding supplement (NEPRO CARB STEADY)  237 mL Oral Q24H  . gabapentin  300 mg Oral QHS  . heparin  5,000 Units Subcutaneous Q8H  . insulin aspart  0-6 Units Subcutaneous TID WC  . insulin glargine  10 Units Subcutaneous Daily  . metoprolol succinate  50 mg Oral Daily  . multivitamin  1 tablet Oral QHS  . polyethylene glycol  17 g Oral Daily  . senna-docusate  1 tablet Oral BID  . sevelamer carbonate  2,400 mg Oral TID WC  . sodium chloride flush  3 mL Intravenous Q12H   Continuous Infusions: . sodium chloride      Consultants:see note  Procedures:see note  Antimicrobials: Anti-infectives (From admission, onward)   Start     Dose/Rate Route Frequency Ordered Stop   08/13/20 0000  vancomycin (VANCOCIN) IVPB 1000 mg/200 mL premix        1,000 mg 200 mL/hr over 60 Minutes Intravenous Every M-W-F (Hemodialysis) 08/10/20 1333 08/14/20 1140   08/12/20 1200  vancomycin (VANCOCIN) IVPB 1000 mg/200 mL premix  Status:  Discontinued        1,000 mg 200 mL/hr over 60 Minutes Intravenous Every T-Th-Sa (Hemodialysis) 08/10/20 1333 08/11/20 1319   08/12/20 1200  vancomycin (VANCOCIN) IVPB 1000 mg/200 mL premix        1,000 mg 200 mL/hr over 60 Minutes Intravenous Every Sat (Hemodialysis) 08/11/20 1319 08/12/20 1236   08/09/20 2200  cefTRIAXone (ROCEPHIN) 2 g in sodium chloride 0.9 % 100 mL IVPB        2 g 200 mL/hr over 30 Minutes Intravenous Every 24 hours 08/09/20 0054 08/14/20 2159   08/09/20 1200  vancomycin (VANCOREADY) IVPB 1000 mg/200 mL  Status:  Discontinued        1,000 mg 200 mL/hr over 60 Minutes Intravenous Every M-W-F (Hemodialysis) 08/08/20 2217 08/09/20 0812   08/09/20 1200  ceFEPIme (MAXIPIME) 2 g in sodium chloride 0.9 % 100 mL IVPB  Status:  Discontinued         2 g 200 mL/hr over 30 Minutes Intravenous Every M-W-F (Hemodialysis) 08/08/20 2217 08/09/20 0054   08/09/20 1200  vancomycin (VANCOCIN) IVPB 1000 mg/200 mL premix  Status:  Discontinued        1,000 mg 200 mL/hr over 60 Minutes Intravenous Every M-W-F (Hemodialysis) 08/09/20 0812 08/10/20 1333   08/09/20 0100  metroNIDAZOLE (FLAGYL) tablet 500 mg        500 mg Oral Every  8 hours 08/09/20 0054 08/14/20 2359   08/08/20 2200  vancomycin (VANCOREADY) IVPB 2000 mg/400 mL        2,000 mg 200 mL/hr over 120 Minutes Intravenous  Once 08/08/20 2145 08/09/20 0143   08/08/20 2200  ceFEPIme (MAXIPIME) 2 g in sodium chloride 0.9 % 100 mL IVPB        2 g 200 mL/hr over 30 Minutes Intravenous  Once 08/08/20 2145 08/08/20 2333   08/08/20 2145  vancomycin (VANCOREADY) IVPB 1000 mg/200 mL  Status:  Discontinued        1,000 mg 200 mL/hr over 60 Minutes Intravenous  Once 08/08/20 2136 08/08/20 2145     Culture/Microbiology    Component Value Date/Time   SDES BLOOD RIGHT HAND 08/13/2020 0132   SPECREQUEST  08/13/2020 0132    BOTTLES DRAWN AEROBIC ONLY Blood Culture adequate volume   CULT  08/13/2020 0132    NO GROWTH 2 DAYS Performed at [facility], [place] [address], [place],  [address]    REPTSTATUS PENDING 08/13/2020 0132    Other culture-see note  Objective: Vitals: Today's Vitals   08/15/20 1037 08/15/20 1115 08/15/20 1123 08/15/20 1455  BP:  (!) 140/59    Pulse:  76    Resp:  20    Temp:  98.3 F (36.8 C)    TempSrc:  Oral    SpO2:  97%    Weight:      Height:      PainSc: 4   2  Asleep   No intake or output data in the 24 hours ending 08/15/20 1644 Filed Weights   08/14/20 0451 08/14/20 0730 08/14/20 1150  Weight: 101.2 kg 102 kg 100 kg   Weight change: 0.8 kg  Intake/Output from previous day: 04/18 0701 - 04/19 0700 In: 117 [P.O.:117] Out: 2000  Intake/Output this shift: No intake/output data recorded. Filed Weights   08/14/20 0451 08/14/20 0730  08/14/20 1150  Weight: 101.2 kg 102 kg 100 kg    Examination: General exam: AAOx3, pleasant, NAD, weak appearing. HEENT:Oral mucosa moist, Ear/Nose WNL grossly, dentition normal. Respiratory system: bilaterally clear,no wheezing or crackles,no use of accessory muscle Cardiovascular system: S1 & S2 +, No JVD,. Gastrointestinal system: Abdomen soft, NT,ND, BS+ Nervous System:Alert, awake, moving extremities and grossly nonfocal Extremities: No edema, recent left BKA, right foot TMA with dressing in place Skin: No rashes,no icterus. MSK: Normal muscle bulk,tone, power  Data Reviewed: I have personally reviewed following labs and imaging studies CBC: Recent Labs  Lab 08/08/20 1855 08/09/20 0348 08/11/20 0245 08/12/20 0853 08/13/20 0124 08/14/20 0135 08/15/20 0215  WBC 18.3*   < > 15.5* 24.5* 20.5* 15.9* 14.5*  NEUTROABS 14.3*  --   --   --  15.9* 11.4* 10.7*  HGB 9.2*   < > 8.1* 7.6* 7.4* 8.5* 7.7*  HCT 31.3*   < > 27.0* 26.1* 24.3* 28.3* 25.9*  MCV 84.6   < > 82.3 84.7 81.8 82.5 82.2  PLT 894*   < > 751* 722* 653* 639* 671*   < > = values in this interval not displayed.   Basic Metabolic Panel: Recent Labs  Lab 08/08/20 1855 08/09/20 0348 08/11/20 0245 08/12/20 0852 08/13/20 0124 08/14/20 0135 08/15/20 0644  NA 136   < > 132* 133* 133* 131* 132*  K 4.7   < > 3.9 4.2 3.6 4.1 4.0  CL 96*   < > 96* 97* 93* 92* 94*  CO2 26   < > 26  23 29 24 27   GLUCOSE 166*   < > 149* 237* 152* 108* 130*  BUN 20   < > 32* 53* 26* 40* 28*  CREATININE 7.67*   < > 7.01* 8.76* 5.42* 7.19* 5.87*  CALCIUM 8.9   < > 8.5* 8.9 8.5* 8.4* 8.5*  MG 1.9  --   --   --   --   --   --   PHOS  --   --   --  5.2*  --   --   --    < > = values in this interval not displayed.   GFR: Estimated Creatinine Clearance: 11.1 mL/min (A) (by C-G formula based on SCr of 5.87 mg/dL (H)). Liver Function Tests: Recent Labs  Lab 08/08/20 1855 08/12/20 0852  AST 10*  --   ALT 7  --   ALKPHOS 63  --   BILITOT  0.6  --   PROT 8.0  --   ALBUMIN 2.6* 2.1*   No results for input(s): LIPASE, AMYLASE in the last 168 hours. No results for input(s): AMMONIA in the last 168 hours. Coagulation Profile: Recent Labs  Lab 08/10/20 0355  INR 1.2   Cardiac Enzymes: No results for input(s): CKTOTAL, CKMB, CKMBINDEX, TROPONINI in the last 168 hours. BNP (last 3 results) No results for input(s): PROBNP in the last 8760 hours. HbA1C: No results for input(s): HGBA1C in the last 72 hours. CBG: Recent Labs  Lab 08/14/20 1230 08/14/20 1643 08/14/20 2127 08/15/20 0612 08/15/20 1111  GLUCAP 104* 122* 186* 127* 185*   Lipid Profile: No results for input(s): CHOL, HDL, LDLCALC, TRIG, CHOLHDL, LDLDIRECT in the last 72 hours. Thyroid Function Tests: No results for input(s): TSH, T4TOTAL, FREET4, T3FREE, THYROIDAB in the last 72 hours. Anemia Panel: No results for input(s): VITAMINB12, FOLATE, FERRITIN, TIBC, IRON, RETICCTPCT in the last 72 hours. Sepsis Labs: Recent Labs  Lab 08/08/20 1855  LATICACIDVEN 1.4    Recent Results (from the past 240 hour(s))  Blood culture (routine x 2)     Status: None   Collection Time: 08/08/20  8:44 PM   Specimen: BLOOD  Result Value Ref Range Status   Specimen Description BLOOD SITE NOT SPECIFIED  Final   Special Requests   Final    BOTTLES DRAWN AEROBIC AND ANAEROBIC Blood Culture results may not be optimal due to an inadequate volume of blood received in culture bottles   Culture   Final    NO GROWTH 5 DAYS Performed at [facility], [address] [place], [place] [address]    Report Status 08/13/2020 FINAL  Final  SARS CORONAVIRUS 2 (TAT 6-24 HRS) Nasopharyngeal Nasopharyngeal Swab     Status: None   Collection Time: 08/08/20 10:31 PM   Specimen: Nasopharyngeal Swab  Result Value Ref Range Status   SARS Coronavirus 2 NEGATIVE NEGATIVE Final    Comment: (NOTE) SARS-CoV-2 target nucleic acids are NOT DETECTED.  The SARS-CoV-2 RNA is generally  detectable in upper and lower respiratory specimens during the acute phase of infection. Negative results do not preclude SARS-CoV-2 infection, do not rule out co-infections with other pathogens, and should not be used as the sole basis for treatment or other patient management decisions. Negative results must be combined with clinical observations, patient history, and epidemiological information. The expected result is Negative.  Fact Sheet for Patients: SugarRoll.be  Fact Sheet for Healthcare Providers: https://www.woods-mathews.com/  This test is not yet approved or cleared by the [place] FDA  and  has been authorized for detection and/or diagnosis of SARS-CoV-2 by FDA under an Emergency Use Authorization (EUA). This EUA will remain  in effect (meaning this test can be used) for the duration of the COVID-19 declaration under Se ction 564(b)(1) of the Act, 21 U.S.C. section 360bbb-3(b)(1), unless the authorization is terminated or revoked sooner.  Performed at [facility], [place] [address] [place], [place] [address]   Surgical pcr screen     Status: None   Collection Time: 08/10/20  9:59 PM   Specimen: Nasal Mucosa; Nasal Swab  Result Value Ref Range Status   MRSA, PCR NEGATIVE NEGATIVE Final   Staphylococcus aureus NEGATIVE NEGATIVE Final    Comment: (NOTE) The Xpert SA Assay (FDA approved for NASAL specimens in patients 25 years of age and older), is one component of a comprehensive surveillance program. It is not intended to diagnose infection nor to guide or monitor treatment. Performed at [facility], [place] [address], [place], [place] [address]   Culture, blood (routine x 2)     Status: None (Preliminary result)   Collection Time: 08/13/20  1:24 AM   Specimen: BLOOD LEFT ARM  Result Value Ref Range Status   Specimen Description BLOOD LEFT ARM  Final   Special Requests   Final    BOTTLES DRAWN AEROBIC ONLY  Blood Culture adequate volume   Culture   Final    NO GROWTH 2 DAYS Performed at [facility], [address] [place], [place] [address]    Report Status PENDING  Incomplete  Culture, blood (routine x 2)     Status: None (Preliminary result)   Collection Time: 08/13/20  1:32 AM   Specimen: BLOOD RIGHT HAND  Result Value Ref Range Status   Specimen Description BLOOD RIGHT HAND  Final   Special Requests   Final    BOTTLES DRAWN AEROBIC ONLY Blood Culture adequate volume   Culture   Final    NO GROWTH 2 DAYS Performed at [facility], [place] [address] [place],  [address]    Report Status PENDING  Incomplete     Radiology Studies: No results found.   LOS: 7 days   George Hugh, MD [facility]  08/15/2020, 4:44 PM

## 2020-08-15 NOTE — TOC Initial Note (Signed)
Transition of Care (TOC) - Initial/Assessment Note  Marvetta Gibbons RN, BSN Transitions of Care Unit 4E- RN Case Manager See Treatment Team for direct phone #    Patient Details  Name: Christy Zhang MRN: [id number] Date of Birth: 11-24-1955  Transition of Care [facility]) CM/SW Contact:    Dawayne Patricia, RN Phone Number: 08/15/2020, 4:13 PM  Clinical Narrative:                 Pt from home, notified by Vascular that pt will need home wound VAC, orders placed for [facility], home wound VAC form has been signed for [facility] home [facility]- form faxed to [place] with [employer] to start process for home [facility] approval. Pending approval - VAC will be released for delivery here to hospital.  Notified by Ramond Marrow with [facility] that pt is active with them for [facility] services- they can resume services at discharge for home [facility] needs.   TOC to continue to follow for transition of care needs and f/u on home [facility] approval tomorrow.     Expected Discharge Plan: [place] Barriers to Discharge: Continued Medical Work up   Patient Goals and CMS Choice Patient states their goals for this hospitalization and ongoing recovery are:: return home CMS Medicare.gov Compare Post Acute Care list provided to:: Patient Choice offered to / list presented to : Patient  Expected Discharge Plan and Services Expected Discharge Plan: [place]   Discharge Planning Services: CM Consult Post Acute Care Choice: Home Health,Resumption of Svcs/PTA Provider Living arrangements for the past 2 months: Single Family Home                 DME Arranged: Vac DME Agency: [employer] Date DME Agency Contacted: 08/15/20 Time DME Agency Contacted: 1600 Representative spoke with at DME Agency: Olivia Mackie HH Arranged: RN [place] Agency: [place] ([place]) Date [place]: 08/15/20 Time [place]: 72 Representative spoke with at [place]: [place]  Arrangements/Services Living arrangements for the past 2 months: [place] Lives with:: Self   Do you feel safe going back to the place where you live?: Yes      Need for Family Participation in Patient Care: Yes (Comment) Care giver support system in place?: Yes (comment) Current home services: DME,Home RN Criminal Activity/Legal Involvement Pertinent to Current Situation/Hospitalization: No - Comment as needed  Activities of Daily Living Home Assistive Devices/Equipment: Wheelchair,Walker (specify type),Bedside commode/3-in-1 ADL Screening (condition at time of admission) Patient's cognitive ability adequate to safely complete daily activities?: Yes Is the patient deaf or have difficulty hearing?: No Does the patient have difficulty seeing, even when wearing glasses/contacts?: No Does the patient have difficulty concentrating, remembering, or making decisions?: No Patient able to express need for assistance with ADLs?: Yes Does the patient have difficulty dressing or bathing?: Yes Independently performs ADLs?: Yes (appropriate for developmental age) Does the patient have difficulty walking or climbing stairs?: Yes Weakness of Legs: Both Weakness of Arms/Hands: None  Permission Sought/Granted Permission sought to share information with : Chartered certified accountant granted to share information with : Yes, Verbal Permission Granted     Permission granted to share info w AGENCY: HH/DME        Emotional Assessment Appearance:: Appears stated age Attitude/Demeanor/Rapport: Engaged Affect (typically observed): Appropriate Orientation: : Oriented to Self,Oriented to Place,Oriented to  Time,Oriented to Situation Alcohol / Substance Use: Not Applicable Psych Involvement: No (comment)  Admission diagnosis:  Right foot pain [M79.671] Osteomyelitis of right foot ([place]) [M86.9] Osteomyelitis of right foot, unspecified type [facility]) [M86.9] Patient Active Problem  List   Diagnosis Date Noted  . Diabetic foot infection ([place])   . Medication monitoring encounter   . Pressure injury of skin 08/11/2020  . Thrombocytosis 08/09/2020  . Osteomyelitis of right foot ([place]) 08/08/2020  . Leucocytosis 07/05/2020  . Below-knee amputation of left lower extremity ([place]) 06/16/2020  . S/P BKA (below knee amputation) unilateral ([place]) 06/08/2020  . Abdominal wall cellulitis   . Panniculitis 12/29/2019  . Osteomyelitis ([place]) 10/15/2019  . Osteomyelitis of third toe of right foot ([place]) 10/14/2019  . Controlled type 2 diabetes mellitus with chronic kidney disease on chronic dialysis, with long-term current use of insulin ([place]) 12/02/2016  . ESRD (end stage renal disease) ([place])   . Asthma, mild persistent 07/25/2015  . Hypertension secondary to other renal disorders 07/25/2015  . Anemia in chronic kidney disease 02/17/2015  . 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 Essential (primary) hypertension 06/24/2014  . Heart failure ([place]) 06/24/2014  . Obesity, Class III, BMI 40-49.9 (morbid obesity) ([place]) 06/24/2014   PCP:  Claretta Fraise, MD Pharmacy:   [place], [place] [address] Phone: [phone] Fax: [phone]     Social Determinants of Health (SDOH) Interventions    Readmission Risk Interventions No flowsheet data found.

## 2020-08-16 DIAGNOSIS — M86671 Other chronic osteomyelitis, right ankle and foot: Secondary | ICD-10-CM | POA: Diagnosis not present

## 2020-08-16 LAB — CBC WITH DIFFERENTIAL/PLATELET
Abs Immature Granulocytes: 0.15 10*3/uL — ABNORMAL HIGH (ref 0.00–0.07)
Basophils Absolute: 0.1 10*3/uL (ref 0.0–0.1)
Basophils Relative: 0 %
Eosinophils Absolute: 0.5 10*3/uL (ref 0.0–0.5)
Eosinophils Relative: 3 %
HCT: 26.3 % — ABNORMAL LOW (ref 36.0–46.0)
Hemoglobin: 8.1 g/dL — ABNORMAL LOW (ref 12.0–15.0)
Immature Granulocytes: 1 %
Lymphocytes Relative: 10 %
Lymphs Abs: 1.8 10*3/uL (ref 0.7–4.0)
MCH: 25 pg — ABNORMAL LOW (ref 26.0–34.0)
MCHC: 30.8 g/dL (ref 30.0–36.0)
MCV: 81.2 fL (ref 80.0–100.0)
Monocytes Absolute: 1 10*3/uL (ref 0.1–1.0)
Monocytes Relative: 6 %
Neutro Abs: 14 10*3/uL — ABNORMAL HIGH (ref 1.7–7.7)
Neutrophils Relative %: 80 %
Platelets: 658 10*3/uL — ABNORMAL HIGH (ref 150–400)
RBC: 3.24 MIL/uL — ABNORMAL LOW (ref 3.87–5.11)
RDW: 18.1 % — ABNORMAL HIGH (ref 11.5–15.5)
WBC: 17.5 10*3/uL — ABNORMAL HIGH (ref 4.0–10.5)
nRBC: 0 % (ref 0.0–0.2)

## 2020-08-16 LAB — BASIC METABOLIC PANEL
Anion gap: 10 (ref 5–15)
BUN: 42 mg/dL — ABNORMAL HIGH (ref 8–23)
CO2: 27 mmol/L (ref 22–32)
Calcium: 8.5 mg/dL — ABNORMAL LOW (ref 8.9–10.3)
Chloride: 92 mmol/L — ABNORMAL LOW (ref 98–111)
Creatinine, Ser: 7.42 mg/dL — ABNORMAL HIGH (ref 0.44–1.00)
GFR, Estimated: 6 mL/min — ABNORMAL LOW (ref >60)
Glucose, Bld: 164 mg/dL — ABNORMAL HIGH (ref 70–99)
Potassium: 4.5 mmol/L (ref 3.5–5.1)
Sodium: 129 mmol/L — ABNORMAL LOW (ref 135–145)

## 2020-08-16 LAB — GLUCOSE, CAPILLARY
Glucose-Capillary: 149 mg/dL — ABNORMAL HIGH (ref 70–99)
Glucose-Capillary: 160 mg/dL — ABNORMAL HIGH (ref 70–99)
Glucose-Capillary: 179 mg/dL — ABNORMAL HIGH (ref 70–99)
Glucose-Capillary: 235 mg/dL — ABNORMAL HIGH (ref 70–99)

## 2020-08-16 LAB — HEPATITIS B CORE ANTIBODY, TOTAL: Hep B Core Total Ab: NONREACTIVE

## 2020-08-16 LAB — HEPATITIS B SURFACE ANTIBODY,QUALITATIVE: Hep B S Ab: NONREACTIVE

## 2020-08-16 LAB — HEPATITIS B SURFACE ANTIGEN: Hepatitis B Surface Ag: NONREACTIVE

## 2020-08-16 MED ORDER — ALTEPLASE 2 MG IJ SOLR: 2.0000 mg | Freq: Once | INTRAMUSCULAR | Status: DC | PRN

## 2020-08-16 MED ORDER — SODIUM CHLORIDE 0.9 % IV SOLN: 100.0000 mL | INTRAVENOUS | Status: DC | PRN

## 2020-08-16 MED ORDER — LIDOCAINE HCL (PF) 1 % IJ SOLN: 5.0000 mL | INTRAMUSCULAR | Status: DC | PRN

## 2020-08-16 MED ORDER — DOXERCALCIFEROL 4 MCG/2ML IV SOLN
INTRAVENOUS | Status: AC
  Filled 2020-08-16: qty 2

## 2020-08-16 MED ORDER — HEPARIN SODIUM (PORCINE) 1000 UNIT/ML DIALYSIS: 1000.0000 [IU] | INTRAMUSCULAR | Status: DC | PRN

## 2020-08-16 MED ORDER — OXYCODONE HCL 5 MG PO TABS
ORAL_TABLET | ORAL | Status: AC
  Administered 2020-08-16: 5 mg via ORAL
  Filled 2020-08-16: qty 1

## 2020-08-16 MED ORDER — ACETAMINOPHEN 325 MG PO TABS
ORAL_TABLET | ORAL | Status: AC
  Filled 2020-08-16: qty 2

## 2020-08-16 MED ORDER — LIDOCAINE-PRILOCAINE 2.5-2.5 % EX CREA: 1.0000 "application " | TOPICAL_CREAM | CUTANEOUS | Status: DC | PRN

## 2020-08-16 MED ORDER — PENTAFLUOROPROP-TETRAFLUOROETH EX AERO: 1.0000 "application " | INHALATION_SPRAY | CUTANEOUS | Status: DC | PRN

## 2020-08-16 NOTE — Progress Notes (Addendum)
VASCULAR SURGERY ASSESSMENT & PLAN:   POD 5 TMA: Critical limb ischemia of the right lower extremity with tissue loss/gangrene of right toes.Plain films were suspicious for osteo of distal phalanx of the second digit and the cut end of the residual third proximal phalanxand possibleadditional erosive change involving the bases of the second and third proximal phalanges and head of third metatarsal. Now status post right TMA.  VAC dressing changed today by wound care staff.  I spoke with the wound care nurse who said the wound bed is clean, dry without output or granulation tissue yet.  She will not change the dressing on Friday and allow [place] to remove it and inspect wound bed.  Tm 99.4. VAC dressing in place with good seal. Plavix, statin and asa continue.  Leukocytosis persists: up today to 17.5. On vancomycin, ceftriaxone and metronidazole.  End-stage renal disease: Patient dialyzing currently on every other day schedule here in the hospital.  She will dialyze again this morning.  PTx Darco shoe right foot. PWB heel only right foot.  I have interviewed the patient and examined the patient. I agree with the findings by the PA.  I will try to look at the wound Friday before her VAC change.  Gae Gallop, MD [phone]   SUBJECTIVEBuzzy Zhang, awakens to loud voice.  PHYSICAL EXAM:   Vitals:   08/15/20 1954 08/15/20 2000 08/15/20 2333 08/16/20 0300  BP: 140/62  (!) 147/68 (!) 152/61  Pulse: 73 83 81 81  Resp: 16 (!) '22 16 16  '$ Temp: 98.7 F (37.1 C)  98.7 F (37.1 C) 98.8 F (37.1 C)  TempSrc: Oral  Oral Oral  SpO2: 96% 97% 95% 93%  Weight:      Height:       General appearance: Awake, alert in no apparent distress Cardiac: Heart rate and rhythm are regular Respirations: Nonlabored Extremities:Right foot warm with VAC dressing in place and no output.  LABS:   Lab Results  Component Value Date   WBC 17.5 (H) 08/16/2020   HGB 8.1 (L) 08/16/2020   HCT 26.3  (L) 08/16/2020   MCV 81.2 08/16/2020   PLT 658 (H) 08/16/2020   Lab Results  Component Value Date   CREATININE 7.42 (H) 08/16/2020   Lab Results  Component Value Date   INR 1.2 08/10/2020   CBG (last 3)  Recent Labs    08/15/20 1646 08/15/20 2147 08/16/20 0609  GLUCAP 146* 161* 149*    PROBLEM LIST:    Principal Problem:   Osteomyelitis of right foot (HCC) Active Problems:   ESRD (end stage renal disease) ([place])   Controlled type 2 diabetes mellitus with chronic kidney disease on chronic dialysis, with long-term current use of insulin (HCC)   Essential (primary) hypertension   Thrombocytosis   Pressure injury of skin   Diabetic foot infection ([place])   Medication monitoring encounter   CURRENT MEDS:   . (feeding supplement) PROSource Plus  30 mL Oral BID BM  . amLODipine  5 mg Oral QHS  . aspirin EC  81 mg Oral QHS  . atorvastatin  40 mg Oral Daily  . Chlorhexidine Gluconate Cloth  6 each Topical Q0600  . clopidogrel  75 mg Oral QPM  . doxercalciferol  1.5 mcg Intravenous Q M,W,F-HD  . feeding supplement (NEPRO CARB STEADY)  237 mL Oral Q24H  . gabapentin  300 mg Oral QHS  . heparin  5,000 Units Subcutaneous Q8H  . insulin aspart  0-6 Units  Subcutaneous TID WC  . insulin glargine  10 Units Subcutaneous Daily  . metoprolol succinate  50 mg Oral Daily  . multivitamin  1 tablet Oral QHS  . polyethylene glycol  17 g Oral Daily  . senna-docusate  1 tablet Oral BID  . sevelamer carbonate  2,400 mg Oral TID WC  . sodium chloride flush  3 mL Intravenous Q12H    Barbie Banner, [place] Office: [phone] 08/16/2020

## 2020-08-16 NOTE — Progress Notes (Signed)
PROGRESS NOTE    Christy Zhang  OVF:[id number] DOB: December 09, 1955 DOA: 08/08/2020 PCP: Claretta Fraise, MD   Chief Complaint  Patient presents with  . Foot Pain  Brief Narrative: 65 year old female with PMH of Chronic Hypertension, Uncontrolled Diabetes, ESRD on HD MWF, and Peripheral Vascular Disease s/p Left BKA who presents to the ED on 4/13 with worsening RLE wounds with necrotic tissue while on Cephalex.   Xray showed evidence of osteomyelitis.  ESR is 92 and CRP is 17.  On 4/14, angiogram showed critical limb ischemia.  On 4/15 Vascular Dr. Donzetta Matters performed open transmetatarsal amputation.  Vascular Surgery recommends continued close monitoring.  Subjective: Patient is in dialysis in the morning. She is more tired in the afternoon.  PT delayed their evaluation until she is less fatigued. Wounds look ok.  Wound vac is inplace.  Surgery signed off and said patient can follow up in clinic. Patient had low grade fevers.  Will delay discharge until she clinically looks better and fevers resolve. I will order blood cultures.  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 - Wound Cultures are NG.  Blood Cultures are NG.  Antibiotics were discontinued per Infectious Disease, appreciate.  Low Grade Fevers / Mild Leukocytosis: - Check Blood Cultures. - Given high risk of infection in dialysis patients, I will delay discharge.  Peripheral Vascular Disease s/p 2/10 Left BKA: - Continue Aspirin and Statin. - Continue local wound care.  ESRD and HD MWF:  - Nephrology is following, appreciate.  Anemia of CKD: on ESA. Hyperparathyroidism: on Renvela and Hectorol.  Diabetes Mellitus Type 2 on long term insulin: A1C is 8.8%.. - Continue Lantus 10 units and Lispro SSi with POC glucose q6hrs.  Essential (primary) hypertension:  - Continue Metoprolol and  Amlodipine.  Morbid obesity with BMI 38 kg/m2: - Counseled on weight loss through diet and lifestyle modification.  Stage 2 Pressure Ulcer located at left buttocks, POA: - Perform local wound care.   Diet Order            Diet renal/carb modified with fluid restriction Diet-HS Snack? Nothing; Fluid restriction: 1200 mL Fluid; Room service appropriate? Yes; Fluid consistency: Thin  Diet effective now                Patient's Body mass index is 38.97 kg/m. DVT prophylaxis: heparin injection 5,000 Units Start: 08/09/20 1400 Code Status:   Code Status: Full Code  Family Communication: plan of care discussed with patient at bedside.  Status is: Inpatient Remains inpatient appropriate because:IV treatments appropriate due to intensity of illness or inability to take PO and Inpatient level of care appropriate due to severity of illness remains hospitalized for ongoing management of gangrenous right foot.  Dispo: The patient is from: Home              Anticipated d/c is to: Discharged delayed 1-2 days pending fever workup and PT evaluation.              Patient currently is not medically stable to d/c.   Difficult to place patient No   Unresulted Labs (From admission, onward)          Start     Ordered   08/16/20 1642  Culture, blood (routine x 2)  BLOOD CULTURE X 2,   R (with TIMED occurrences)      08/16/20 1641  08/13/20 0500  CBC with Differential/Platelet  Daily,   R      08/12/20 0811   08/13/20 9518  Basic metabolic panel  Daily,   R      08/12/20 0811   Signed and Held  Renal function panel  Once,   R       Question:  Specimen collection method  Answer:  Lab=Lab collect   Signed and Held        Medications reviewed:  Scheduled Meds: . (feeding supplement) PROSource Plus  30 mL Oral BID BM  . amLODipine  5 mg Oral QHS  . aspirin EC  81 mg Oral QHS  . atorvastatin  40 mg Oral Daily  . Chlorhexidine Gluconate Cloth  6 each Topical Q0600  . clopidogrel  75 mg Oral QPM   . doxercalciferol  1.5 mcg Intravenous Q M,W,F-HD  . feeding supplement (NEPRO CARB STEADY)  237 mL Oral Q24H  . gabapentin  300 mg Oral QHS  . heparin  5,000 Units Subcutaneous Q8H  . insulin aspart  0-6 Units Subcutaneous TID WC  . insulin glargine  10 Units Subcutaneous Daily  . metoprolol succinate  50 mg Oral Daily  . multivitamin  1 tablet Oral QHS  . polyethylene glycol  17 g Oral Daily  . senna-docusate  1 tablet Oral BID  . sevelamer carbonate  2,400 mg Oral TID WC  . sodium chloride flush  3 mL Intravenous Q12H   Continuous Infusions: . sodium chloride      Consultants:see note  Procedures:see note  Antimicrobials: Anti-infectives (From admission, onward)   Start     Dose/Rate Route Frequency Ordered Stop   08/13/20 0000  vancomycin (VANCOCIN) IVPB 1000 mg/200 mL premix        1,000 mg 200 mL/hr over 60 Minutes Intravenous Every M-W-F (Hemodialysis) 08/10/20 1333 08/14/20 1140   08/12/20 1200  vancomycin (VANCOCIN) IVPB 1000 mg/200 mL premix  Status:  Discontinued        1,000 mg 200 mL/hr over 60 Minutes Intravenous Every T-Th-Sa (Hemodialysis) 08/10/20 1333 08/11/20 1319   08/12/20 1200  vancomycin (VANCOCIN) IVPB 1000 mg/200 mL premix        1,000 mg 200 mL/hr over 60 Minutes Intravenous Every Sat (Hemodialysis) 08/11/20 1319 08/12/20 1236   08/09/20 2200  cefTRIAXone (ROCEPHIN) 2 g in sodium chloride 0.9 % 100 mL IVPB        2 g 200 mL/hr over 30 Minutes Intravenous Every 24 hours 08/09/20 0054 08/14/20 2159   08/09/20 1200  vancomycin (VANCOREADY) IVPB 1000 mg/200 mL  Status:  Discontinued        1,000 mg 200 mL/hr over 60 Minutes Intravenous Every M-W-F (Hemodialysis) 08/08/20 2217 08/09/20 0812   08/09/20 1200  ceFEPIme (MAXIPIME) 2 g in sodium chloride 0.9 % 100 mL IVPB  Status:  Discontinued        2 g 200 mL/hr over 30 Minutes Intravenous Every M-W-F (Hemodialysis) 08/08/20 2217 08/09/20 0054   08/09/20 1200  vancomycin (VANCOCIN) IVPB 1000 mg/200 mL  premix  Status:  Discontinued        1,000 mg 200 mL/hr over 60 Minutes Intravenous Every M-W-F (Hemodialysis) 08/09/20 0812 08/10/20 1333   08/09/20 0100  metroNIDAZOLE (FLAGYL) tablet 500 mg        500 mg Oral Every 8 hours 08/09/20 0054 08/14/20 2359   08/08/20 2200  vancomycin (VANCOREADY) IVPB 2000 mg/400 mL        2,000 mg 200 mL/hr over 120  Minutes Intravenous  Once 08/08/20 2145 08/09/20 0143   08/08/20 2200  ceFEPIme (MAXIPIME) 2 g in sodium chloride 0.9 % 100 mL IVPB        2 g 200 mL/hr over 30 Minutes Intravenous  Once 08/08/20 2145 08/08/20 2333   08/08/20 2145  vancomycin (VANCOREADY) IVPB 1000 mg/200 mL  Status:  Discontinued        1,000 mg 200 mL/hr over 60 Minutes Intravenous  Once 08/08/20 2136 08/08/20 2145     Culture/Microbiology    Component Value Date/Time   SDES BLOOD RIGHT HAND 08/13/2020 0132   SPECREQUEST  08/13/2020 0132    BOTTLES DRAWN AEROBIC ONLY Blood Culture adequate volume   CULT  08/13/2020 0132    NO GROWTH 3 DAYS Performed at [facility], [place] [address] [place], [place] [address]    REPTSTATUS PENDING 08/13/2020 0132    Other culture-see note  Objective: Vitals: Today's Vitals   08/16/20 1130 08/16/20 1243 08/16/20 1332 08/16/20 1615  BP: 135/70 (!) 137/58 121/62 (!) 111/56  Pulse: 85 89  93  Resp:  20  20  Temp:  99.9 F (37.7 C) 99.8 F (37.7 C) (!) 100.5 F (38.1 C)  TempSrc:  Oral Oral Oral  SpO2:  93% 94% 96%  Weight:  99.8 kg    Height:      PainSc:  0-No pain Asleep     Intake/Output Summary (Last 24 hours) at 08/16/2020 1758 Last data filed at 08/16/2020 1200 Gross per 24 hour  Intake 240 ml  Output 2762 ml  Net -2522 ml   Filed Weights   08/14/20 1150 08/16/20 0837 08/16/20 1243  Weight: 100 kg 102.3 kg 99.8 kg   Weight change:   Intake/Output from previous day: 04/19 0701 - 04/20 0700 In: 480 [P.O.:480] Out: -  Intake/Output this shift: Total I/O In: -  Out: 2762 [Other:2762] Filed Weights    08/14/20 1150 08/16/20 0837 08/16/20 1243  Weight: 100 kg 102.3 kg 99.8 kg    Examination: General exam: AAOx3, pleasant, NAD, weak appearing. HEENT:Oral mucosa moist, Ear/Nose WNL grossly, dentition normal. Respiratory system: bilaterally clear,no wheezing or crackles,no use of accessory muscle Cardiovascular system: S1 & S2 +, No JVD,. Gastrointestinal system: Abdomen soft, NT,ND, BS+ Nervous System:Alert, awake, moving extremities and grossly nonfocal Extremities: No edema, recent left BKA, right foot TMA with dressing in place Skin: No rashes,no icterus. MSK: Normal muscle bulk,tone, power  Data Reviewed: I have personally reviewed following labs and imaging studies CBC: Recent Labs  Lab 08/12/20 0853 08/13/20 0124 08/14/20 0135 08/15/20 0215 08/16/20 0232  WBC 24.5* 20.5* 15.9* 14.5* 17.5*  NEUTROABS  --  15.9* 11.4* 10.7* 14.0*  HGB 7.6* 7.4* 8.5* 7.7* 8.1*  HCT 26.1* 24.3* 28.3* 25.9* 26.3*  MCV 84.7 81.8 82.5 82.2 81.2  PLT 722* 653* 639* 671* 716*   Basic Metabolic Panel: Recent Labs  Lab 08/12/20 0852 08/13/20 0124 08/14/20 0135 08/15/20 0644 08/16/20 0232  NA 133* 133* 131* 132* 129*  K 4.2 3.6 4.1 4.0 4.5  CL 97* 93* 92* 94* 92*  CO2 _0 GLUCOSE 237* 152* 108* 130* 164*  BUN 53* 26* 40* 28* 42*  CREATININE 8.76* 5.42* 7.19* 5.87* 7.42*  CALCIUM 8.9 8.5* 8.4* 8.5* 8.5*  PHOS 5.2*  --   --   --   --    GFR: Estimated Creatinine Clearance: 8.7 mL/min (A) (by C-G formula based on SCr of 7.42 mg/dL (H)). Liver Function  Tests: Recent Labs  Lab 08/12/20 0852  ALBUMIN 2.1*   No results for input(s): LIPASE, AMYLASE in the last 168 hours. No results for input(s): AMMONIA in the last 168 hours. Coagulation Profile: Recent Labs  Lab 08/10/20 0355  INR 1.2   Cardiac Enzymes: No results for input(s): CKTOTAL, CKMB, CKMBINDEX, TROPONINI in the last 168 hours. BNP (last 3 results) No results for input(s): PROBNP in the last 8760  hours. HbA1C: No results for input(s): HGBA1C in the last 72 hours. CBG: Recent Labs  Lab 08/15/20 1646 08/15/20 2147 08/16/20 0609 08/16/20 1456 08/16/20 1610  GLUCAP 146* 161* 149* 160* 179*   Lipid Profile: No results for input(s): CHOL, HDL, LDLCALC, TRIG, CHOLHDL, LDLDIRECT in the last 72 hours. Thyroid Function Tests: No results for input(s): TSH, T4TOTAL, FREET4, T3FREE, THYROIDAB in the last 72 hours. Anemia Panel: No results for input(s): VITAMINB12, FOLATE, FERRITIN, TIBC, IRON, RETICCTPCT in the last 72 hours. Sepsis Labs: No results for input(s): PROCALCITON, LATICACIDVEN in the last 168 hours.  Recent Results (from the past 240 hour(s))  Blood culture (routine x 2)     Status: None   Collection Time: 08/08/20  8:44 PM   Specimen: BLOOD  Result Value Ref Range Status   Specimen Description BLOOD SITE NOT SPECIFIED  Final   Special Requests   Final    BOTTLES DRAWN AEROBIC AND ANAEROBIC Blood Culture results may not be optimal due to an inadequate volume of blood received in culture bottles   Culture   Final    NO GROWTH 5 DAYS Performed at [facility], [place] [address], [place], [place] [address]    Report Status 08/13/2020 FINAL  Final  SARS CORONAVIRUS 2 (TAT 6-24 HRS) Nasopharyngeal Nasopharyngeal Swab     Status: None   Collection Time: 08/08/20 10:31 PM   Specimen: Nasopharyngeal Swab  Result Value Ref Range Status   SARS Coronavirus 2 NEGATIVE NEGATIVE Final    Comment: (NOTE) SARS-CoV-2 target nucleic acids are NOT DETECTED.  The SARS-CoV-2 RNA is generally detectable in upper and lower respiratory specimens during the acute phase of infection. Negative results do not preclude SARS-CoV-2 infection, do not rule out co-infections with other pathogens, and should not be used as the sole basis for treatment or other patient management decisions. Negative results must be combined with clinical observations, patient history, and epidemiological  information. The expected result is Negative.  Fact Sheet for Patients: SugarRoll.be  Fact Sheet for Healthcare Providers: https://www.woods-mathews.com/  This test is not yet approved or cleared by the [place] FDA and  has been authorized for detection and/or diagnosis of SARS-CoV-2 by FDA under an Emergency Use Authorization (EUA). This EUA will remain  in effect (meaning this test can be used) for the duration of the COVID-19 declaration under Se ction 564(b)(1) of the Act, 21 U.S.C. section 360bbb-3(b)(1), unless the authorization is terminated or revoked sooner.  Performed at [facility], [place] [address] [place], [place] [address]   Surgical pcr screen     Status: None   Collection Time: 08/10/20  9:59 PM   Specimen: Nasal Mucosa; Nasal Swab  Result Value Ref Range Status   MRSA, PCR NEGATIVE NEGATIVE Final   Staphylococcus aureus NEGATIVE NEGATIVE Final    Comment: (NOTE) The Xpert SA Assay (FDA approved for NASAL specimens in patients 58 years of age and older), is one component of a comprehensive surveillance program. It is not intended to diagnose infection nor to guide or  monitor treatment. Performed at [facility], [place] [address], [place], [place] [address]   Culture, blood (routine x 2)     Status: None (Preliminary result)   Collection Time: 08/13/20  1:24 AM   Specimen: BLOOD LEFT ARM  Result Value Ref Range Status   Specimen Description BLOOD LEFT ARM  Final   Special Requests   Final    BOTTLES DRAWN AEROBIC ONLY Blood Culture adequate volume   Culture   Final    NO GROWTH 3 DAYS Performed at [facility], [address], [place], [place] [address]    Report Status PENDING  Incomplete  Culture, blood (routine x 2)     Status: None (Preliminary result)   Collection Time: 08/13/20  1:32 AM   Specimen: BLOOD RIGHT HAND  Result Value Ref Range Status   Specimen Description BLOOD RIGHT HAND   Final   Special Requests   Final    BOTTLES DRAWN AEROBIC ONLY Blood Culture adequate volume   Culture   Final    NO GROWTH 3 DAYS Performed at [facility], [place] [address] [place], [place] [address]    Report Status PENDING  Incomplete     Radiology Studies: No results found.   LOS: 8 days   George Hugh, MD [facility]  08/16/2020, 5:58 PM

## 2020-08-16 NOTE — Consult Note (Signed)
[facility] Nurse Consult Note: Patient receiving care in [place]. NPWT dressing to right foot removed. Wound bed dry, dark red. No drainage in canister. One piece of black foam placed over the wound bed, drape applied, immediate seal obtained. Patient very sleepy, asked a few questions while I did dressing change, but primarily laid in bed with eyes closed. Small dressing in room for change on Friday. Val Riles, RN, MSN, CWOCN, CNS-BC, pager [phone]

## 2020-08-16 NOTE — Progress Notes (Signed)
PT Cancellation Note  Patient Details Name: Christy Zhang MRN: [id number] DOB: 1955-07-11   Cancelled Treatment:    Reason Eval/Treat Not Completed: Patient at procedure or test/unavailable - HD transport arriving to room upon PT arrival, will check back post-HD.  Stacie Glaze, PT DPT Acute Rehabilitation Services Pager [phone]  Office [phone]    Louis Matte 08/16/2020, 8:31 AM

## 2020-08-16 NOTE — Progress Notes (Signed)
Christy Zhang Progress Note   Assessment/ Plan:   Christy Zhang is a 64 yofs/p amputation withESRD treated with outpatient HD  1. Osteomyelitis RLE with gangrene - R transmetatarsal amputation 4/15 - Managed by vascular surgery, likely wound vac placement  2. ESRD: - MWF outpatient schedule, plan to continue as inpatient. - Last HD tx 4/18 completed without issue; net UF 2000 mL (at goal). Tx planned for today.  3. Hypertension: - Continue home metoprolol and Norvasc  4. Anemia of ESRD: - Aranesp 200 q Fri - Not candidate for IV iron with active infection. WBC improving but still elevated. - Transfusion if Hgb < 7  5. MBD of ESRD: - Continue Renvela and Hectorol  6. Hyponatremia - NA decreased to 129. Patient does not c/o any symptoms, so will continue to closely monitor. Expect to see improvement after HD today.  Subjective:   At time of evaluation, Christy Zhang is receiving HD. She reports some RLE pain related to placement of wound VAC and says she has been given pain meds. She states she feels ok otherwise.   Objective:   BP (!) 152/61 (BP Location: Left Arm)   Pulse 81   Temp 98.8 F (37.1 C) (Oral)   Resp 16   Ht '5\' 3"'$  (1.6 m)   Wt 100 kg   LMP  (LMP Unknown)   SpO2 93%   BMI 39.05 kg/m   Physical Exam: [id number] in hospital bed, in NAD CVS:RRR, no murmurs or rubs Resp:Unlabored; BS clear & equal bilaterally [id number], nontender [id number] BKA; right foot bandaged Access: RUA AVG, good bruit, currently being accessed without issue.  Labs: BMET Recent Labs  Lab 08/10/20 0355 08/11/20 0245 08/12/20 0852 08/13/20 0124 08/14/20 0135 08/15/20 0644 08/16/20 0232  NA 135 132* 133* 133* 131* 132* 129*  K 3.8 3.9 4.2 3.6 4.1 4.0 4.5  CL 97* 96* 97* 93* 92* 94* 92*  CO2 '28 26 23 29 24 27 27  '$ GLUCOSE 114* 149* 237* 152* 108* 130* 164*  BUN 15 32* 53* 26* 40* 28* 42*  CREATININE 5.23* 7.01* 8.76* 5.42* 7.19* 5.87* 7.42*  CALCIUM  8.7* 8.5* 8.9 8.5* 8.4* 8.5* 8.5*  PHOS  --   --  5.2*  --   --   --   --    CBC Recent Labs  Lab 08/13/20 0124 08/14/20 0135 08/15/20 0215 08/16/20 0232  WBC 20.5* 15.9* 14.5* 17.5*  NEUTROABS 15.9* 11.4* 10.7* 14.0*  HGB 7.4* 8.5* 7.7* 8.1*  HCT 24.3* 28.3* 25.9* 26.3*  MCV 81.8 82.5 82.2 81.2  PLT 653* 639* 671* 658*      Medications:    . (feeding supplement) PROSource Plus  30 mL Oral BID BM  . amLODipine  5 mg Oral QHS  . aspirin EC  81 mg Oral QHS  . atorvastatin  40 mg Oral Daily  . Chlorhexidine Gluconate Cloth  6 each Topical Q0600  . clopidogrel  75 mg Oral QPM  . doxercalciferol  1.5 mcg Intravenous Q M,W,F-HD  . feeding supplement (NEPRO CARB STEADY)  237 mL Oral Q24H  . gabapentin  300 mg Oral QHS  . heparin  5,000 Units Subcutaneous Q8H  . insulin aspart  0-6 Units Subcutaneous TID WC  . insulin glargine  10 Units Subcutaneous Daily  . metoprolol succinate  50 mg Oral Daily  . multivitamin  1 tablet Oral QHS  . polyethylene glycol  17 g Oral Daily  . senna-docusate  1 tablet Oral BID  .  sevelamer carbonate  2,400 mg Oral TID WC  . sodium chloride flush  3 mL Intravenous Q12H     Christy Zhang 08/16/2020, 8:05 AM

## 2020-08-16 NOTE — Evaluation (Signed)
Physical Therapy Evaluation Patient Details Name: Christy Zhang MRN: [id number] DOB: 1955-12-05 Today's Date: 08/16/2020   History of Present Illness  65 yo female presents to ED on 4/12 with worsening R foot infection, with new onset purulent discharge from 4th R toe. Xray of R foot shows osteomyelitis of 2nd-3rd toes, erosive changes at base of 2nd and 3rd phalanges. s/p RLE arteriogram, R anterior tibial artery angioplasty, mynx closure of L common femoral artery on 4/15. s/p R transmet amputation on 4/15. 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  Clinical Impression   Pt presents with generalized weakness, post-operative R foot discomfort, difficulty performing mobility tasks, inability to transfer today, AMS, and decreased activity tolerance vs baseline. Pt to benefit from acute PT to address deficits. Pt requiring mod assist for moving to/from EOB today, pt declining OOB attempt due to fatigue. Pt states she is able to get in/out of w/c at home, and ambulate short distances with RW. Pt eager to d/c home, will need to demonstrate ability to transfer to/from w/c and PT recommending resuming HHPT at d/c. PT to progress mobility as tolerated, and will continue to follow acutely.   SpO2 min 84% on RA upon arrival with pt sleeping, recovers to 0000000 with application of 2LO2 and breathing technique. O2 removed when awake, SpO2 maintained in 90s    Follow Up Recommendations Home health PT;Supervision for mobility/OOB    Equipment Recommendations  None recommended by PT    Recommendations for Other Services       Precautions / Restrictions Precautions Precautions: Fall Precaution Comments: L BKA Restrictions Weight Bearing Restrictions: Yes RLE Weight Bearing: Partial weight bearing RLE Partial Weight Bearing Percentage or Pounds: through heel in darco shoe LLE Weight Bearing: Non weight bearing (BKA)      Mobility  Bed  Mobility Overal bed mobility: Needs Assistance Bed Mobility: Supine to Sit;Sit to Supine     Supine to sit: Mod assist Sit to supine: Mod assist   General bed mobility comments: mod assist for trunk elevation off of bed, and LE lifting into and out of bed. Boost assist with bed pad and HOB flat required to reach [facility].    Transfers                 General transfer comment: declines transfer OOB  Ambulation/Gait                Stairs            Wheelchair Mobility    Modified Rankin (Stroke Patients Only)       Balance Overall balance assessment: Needs assistance Sitting-balance support: No upper extremity supported;Feet supported Sitting balance-Leahy Scale: Fair Sitting balance - Comments: able to sit EOB unsupported, preference for R lateral leaning Postural control: Right lateral lean                                   Pertinent Vitals/Pain Pain Assessment: Faces Faces Pain Scale: Hurts a little bit Pain Location: R residual foot Pain Descriptors / Indicators: Sore;Discomfort;Guarding Pain Intervention(s): Limited activity within patient's tolerance;Monitored during session;Repositioned    Home Living Family/patient expects to be discharged to:: Private residence Living Arrangements: Children;Other relatives Available Help at Discharge: Family Type of Home: House Home Access: Ramped entrance     Home Layout: Two level;Able to live on main level  with bedroom/bathroom Home Equipment: Walker - 2 wheels;Tub bench;Hand held shower head;Wheelchair - [employer];Hospital bed      Prior Function Level of Independence: Needs assistance   Gait / Transfers Assistance Needed: pt states she ambulates short distances with use of RW, otherwise uses w/c for mobility.  ADL's / Homemaking Assistance Needed: Pt receives assist from daughter for ADLs, daughter is home with her all the time to assist  Comments: daughter takes pt to  and from dialysis     Hand Dominance   Dominant Hand: Right    Extremity/Trunk Assessment   Upper Extremity Assessment Upper Extremity Assessment: Defer to OT evaluation    Lower Extremity Assessment Lower Extremity Assessment: Generalized weakness    Cervical / Trunk Assessment Cervical / Trunk Assessment: Normal  Communication   Communication: No difficulties  Cognition Arousal/Alertness: Awake/alert (drowsy) Behavior During Therapy: WFL for tasks assessed/performed Overall Cognitive Status: No family/caregiver present to determine baseline cognitive functioning                                 General Comments: Pt with frequent eye closing during session, endorses fatigue s/p HD. Pt answering questions inconsistently, difficult to discern if this is pt drowsiness, cognition, or pt's sense of humor as she would selectively not answer questions and smile.      General Comments General comments (skin integrity, edema, etc.): SpO2 min 84% on RA upon arrival with pt sleeping, recovers to 0000000 with application of 2LO2 and breathing technique. O2 removed when awake, SpO2 maintained in 90s    Exercises     Assessment/Plan    PT Assessment Patient needs continued PT services  PT Problem List Decreased strength;Decreased mobility;Decreased safety awareness;Decreased activity tolerance;Decreased balance;Decreased knowledge of use of DME;Pain;Decreased cognition       PT Treatment Interventions DME instruction;Therapeutic activities;Therapeutic exercise;Patient/family education;Balance training;Functional mobility training;Neuromuscular re-education    PT Goals (Current goals can be found in the Care Plan section)  Acute Rehab PT Goals Patient Stated Goal: go home PT Goal Formulation: With patient Time For Goal Achievement: 08/30/20 Potential to Achieve Goals: Good    Frequency Min 3X/week   Barriers to discharge        Co-evaluation                AM-PAC PT "6 Clicks" Mobility  Outcome Measure Help needed turning from your back to your side while in a flat bed without using bedrails?: A Lot Help needed moving from lying on your back to sitting on the side of a flat bed without using bedrails?: A Lot Help needed moving to and from a bed to a chair (including a wheelchair)?: A Lot Help needed standing up from a chair using your arms (e.g., wheelchair or bedside chair)?: A Lot Help needed to walk in hospital room?: A Lot Help needed climbing 3-5 steps with a railing? : A Lot 6 Click Score: 12    End of Session Equipment Utilized During Treatment: Oxygen;Other (comment) (darco shoe sitting EOB) Activity Tolerance: Patient limited by fatigue Patient left: in bed;with bed alarm set;with call bell/phone within reach Nurse Communication: Mobility status PT Visit Diagnosis: Other abnormalities of gait and mobility (R26.89);Difficulty in walking, not elsewhere classified (R26.2);Muscle weakness (generalized) (M62.81)    Time: [id number] PT Time Calculation (min) (ACUTE ONLY): 28 min   Charges:   PT Evaluation $PT Eval Low Complexity: 1 Low PT Treatments $Therapeutic Activity: 8-22 mins  Stacie Glaze, PT DPT Acute Rehabilitation Services Pager [phone]  Office [phone]  Louis Matte 08/16/2020, 3:49 PM

## 2020-08-16 NOTE — Progress Notes (Signed)
Orthopedic Tech Progress Note Patient Details:  Christy Zhang 1956/01/02 [id number]  Ortho Devices Type of Ortho Device: Darco shoe Ortho Device/Splint Location: RLE Ortho Device/Splint Interventions: Ordered,Application,Adjustment   Post Interventions Patient Tolerated: Well Instructions Provided: Care of device   Janit Pagan 08/16/2020, 2:14 PM

## 2020-08-17 DIAGNOSIS — M86171 Other acute osteomyelitis, right ankle and foot: Secondary | ICD-10-CM | POA: Diagnosis not present

## 2020-08-17 LAB — CBC WITH DIFFERENTIAL/PLATELET
Abs Immature Granulocytes: 0.22 10*3/uL — ABNORMAL HIGH (ref 0.00–0.07)
Basophils Absolute: 0.1 10*3/uL (ref 0.0–0.1)
Basophils Relative: 1 %
Eosinophils Absolute: 0.5 10*3/uL (ref 0.0–0.5)
Eosinophils Relative: 3 %
HCT: 27.7 % — ABNORMAL LOW (ref 36.0–46.0)
Hemoglobin: 8.3 g/dL — ABNORMAL LOW (ref 12.0–15.0)
Immature Granulocytes: 1 %
Lymphocytes Relative: 9 %
Lymphs Abs: 1.6 10*3/uL (ref 0.7–4.0)
MCH: 24.9 pg — ABNORMAL LOW (ref 26.0–34.0)
MCHC: 30 g/dL (ref 30.0–36.0)
MCV: 82.9 fL (ref 80.0–100.0)
Monocytes Absolute: 1 10*3/uL (ref 0.1–1.0)
Monocytes Relative: 6 %
Neutro Abs: 14.6 10*3/uL — ABNORMAL HIGH (ref 1.7–7.7)
Neutrophils Relative %: 80 %
Platelets: 597 10*3/uL — ABNORMAL HIGH (ref 150–400)
RBC: 3.34 MIL/uL — ABNORMAL LOW (ref 3.87–5.11)
RDW: 18.6 % — ABNORMAL HIGH (ref 11.5–15.5)
WBC: 18 10*3/uL — ABNORMAL HIGH (ref 4.0–10.5)
nRBC: 0 % (ref 0.0–0.2)

## 2020-08-17 LAB — GLUCOSE, CAPILLARY
Glucose-Capillary: 172 mg/dL — ABNORMAL HIGH (ref 70–99)
Glucose-Capillary: 212 mg/dL — ABNORMAL HIGH (ref 70–99)
Glucose-Capillary: 182 mg/dL — ABNORMAL HIGH (ref 70–99)
Glucose-Capillary: 202 mg/dL — ABNORMAL HIGH (ref 70–99)

## 2020-08-17 LAB — BASIC METABOLIC PANEL
Anion gap: 10 (ref 5–15)
BUN: 37 mg/dL — ABNORMAL HIGH (ref 8–23)
CO2: 28 mmol/L (ref 22–32)
Calcium: 8.6 mg/dL — ABNORMAL LOW (ref 8.9–10.3)
Chloride: 93 mmol/L — ABNORMAL LOW (ref 98–111)
Creatinine, Ser: 7.07 mg/dL — ABNORMAL HIGH (ref 0.44–1.00)
GFR, Estimated: 6 mL/min — ABNORMAL LOW (ref >60)
Glucose, Bld: 149 mg/dL — ABNORMAL HIGH (ref 70–99)
Potassium: 4.6 mmol/L (ref 3.5–5.1)
Sodium: 131 mmol/L — ABNORMAL LOW (ref 135–145)

## 2020-08-17 MED ORDER — CHLORHEXIDINE GLUCONATE CLOTH 2 % EX PADS
6.0000 | MEDICATED_PAD | Freq: Every day | CUTANEOUS | Status: DC
  Administered 2020-08-17 – 2020-08-18 (×2): 6 via TOPICAL

## 2020-08-17 NOTE — Plan of Care (Signed)
  Problem: Clinical Measurements: Goal: Respiratory complications will improve Outcome: Progressing Goal: Cardiovascular complication will be avoided Outcome: Progressing   

## 2020-08-17 NOTE — Progress Notes (Signed)
PROGRESS NOTE    Christy Zhang  [id number] DOB: [birth year] DOA: 08/08/2020 PCP: Mechele Claude, MD   Chief Complaint  Patient presents with  . Foot Pain  Brief Narrative: 65 year old female with PMH of Chronic Hypertension, Uncontrolled Diabetes, ESRD on HD MWF, and Peripheral Vascular Disease s/p Left BKA who presents to the ED on 4/13 with worsening RLE wounds with necrotic tissue while on Cephalex.   Xray showed evidence of osteomyelitis.  ESR is 92 and CRP is 17.  On 4/14, angiogram showed critical limb ischemia.  On 4/15 Vascular Dr. Randie Heinz performed open transmetatarsal amputation.  Vascular Surgery recommends continued close monitoring.  Subjective: She is alert and interactive this am, she denies pain, reports having bm, no cough, no sob, no dysuria tmax 100.7, blood culture no growth .  PT delayed their evaluation until she is less fatigued.  Wound vac to right lower extremity, no edema Left leg in immobilizer   Assessment & Plan:  Acute Limb Ischemia/ Right Lower Extremity Osteomyelitis s/p 4/15 Right openTMA with wound vac in place: - Patient will follow up with Vascular Surgery outpatient. - Wound Cultures are NG.  Blood Cultures are NG.  Antibiotics were discontinued per Infectious Disease, appreciate.  Low Grade Fevers / Mild Leukocytosis: - Check Blood Cultures. - Given high risk of infection in dialysis patients, I will delay discharge.  Peripheral Vascular Disease s/p 2/10 Left BKA: - Continue Aspirin and Statin. - Continue local wound care.  ESRD and HD MWF:  - Nephrology is following, appreciate.  Anemia of CKD: on ESA. Hyperparathyroidism: on Renvela and Hectorol.  Diabetes Mellitus Type 2 on long term insulin: A1C is 8.8%.. - Continue Lantus 10 units and Lispro SSi with POC glucose q6hrs.  Essential (primary) hypertension:  - Continue Metoprolol and Amlodipine.  Morbid obesity with BMI 38 kg/m2: - Counseled on weight loss through diet and  lifestyle modification.  Stage 2 Pressure Ulcer located at left buttocks, POA: - Perform local wound care.   Diet Order            Diet renal/carb modified with fluid restriction Diet-HS Snack? Nothing; Fluid restriction: 1200 mL Fluid; Room service appropriate? Yes; Fluid consistency: Thin  Diet effective now                Patient's Body mass index is 40.34 kg/m. DVT prophylaxis: heparin injection 5,000 Units Start: 08/09/20 1400 Code Status:   Code Status: Full Code  Family Communication: plan of care discussed with patient at bedside.  Status is: Inpatient Remains inpatient appropriate because:IV treatments appropriate due to intensity of illness or inability to take PO and Inpatient level of care appropriate due to severity of illness remains hospitalized for ongoing management of gangrenous right foot.  Dispo: The patient is from: Home              Anticipated d/c is to: To be determined, not medically stable to discharge, may need more surgery, need vascular surgery clearance              Patient currently is not medically stable to d/c.   Difficult to place patient No   Unresulted Labs (From admission, onward)          Start     Ordered   Signed and Held  Renal function panel  Once,   R       Question:  Specimen collection method  Answer:  Lab=Lab collect   Signed and Held  Medications reviewed:  Scheduled Meds: . (feeding supplement) PROSource Plus  30 mL Oral BID BM  . amLODipine  5 mg Oral QHS  . aspirin EC  81 mg Oral QHS  . atorvastatin  40 mg Oral Daily  . Chlorhexidine Gluconate Cloth  6 each Topical Q0600  . clopidogrel  75 mg Oral QPM  . doxercalciferol  1.5 mcg Intravenous Q M,W,F-HD  . feeding supplement (NEPRO CARB STEADY)  237 mL Oral Q24H  . gabapentin  300 mg Oral QHS  . heparin  5,000 Units Subcutaneous Q8H  . insulin aspart  0-6 Units Subcutaneous TID WC  . insulin glargine  10 Units Subcutaneous Daily  . metoprolol succinate  50 mg  Oral Daily  . multivitamin  1 tablet Oral QHS  . polyethylene glycol  17 g Oral Daily  . senna-docusate  1 tablet Oral BID  . sevelamer carbonate  2,400 mg Oral TID WC  . sodium chloride flush  3 mL Intravenous Q12H   Continuous Infusions: . sodium chloride      Consultants:see note  Procedures:see note  Antimicrobials: Anti-infectives (From admission, onward)   Start     Dose/Rate Route Frequency Ordered Stop   08/13/20 0000  vancomycin (VANCOCIN) IVPB 1000 mg/200 mL premix        1,000 mg 200 mL/hr over 60 Minutes Intravenous Every M-W-F (Hemodialysis) 08/10/20 1333 08/14/20 1140   08/12/20 1200  vancomycin (VANCOCIN) IVPB 1000 mg/200 mL premix  Status:  Discontinued        1,000 mg 200 mL/hr over 60 Minutes Intravenous Every T-Th-Sa (Hemodialysis) 08/10/20 1333 08/11/20 1319   08/12/20 1200  vancomycin (VANCOCIN) IVPB 1000 mg/200 mL premix        1,000 mg 200 mL/hr over 60 Minutes Intravenous Every Sat (Hemodialysis) 08/11/20 1319 08/12/20 1236   08/09/20 2200  cefTRIAXone (ROCEPHIN) 2 g in sodium chloride 0.9 % 100 mL IVPB        2 g 200 mL/hr over 30 Minutes Intravenous Every 24 hours 08/09/20 0054 08/14/20 2159   08/09/20 1200  vancomycin (VANCOREADY) IVPB 1000 mg/200 mL  Status:  Discontinued        1,000 mg 200 mL/hr over 60 Minutes Intravenous Every M-W-F (Hemodialysis) 08/08/20 2217 08/09/20 0812   08/09/20 1200  ceFEPIme (MAXIPIME) 2 g in sodium chloride 0.9 % 100 mL IVPB  Status:  Discontinued        2 g 200 mL/hr over 30 Minutes Intravenous Every M-W-F (Hemodialysis) 08/08/20 2217 08/09/20 0054   08/09/20 1200  vancomycin (VANCOCIN) IVPB 1000 mg/200 mL premix  Status:  Discontinued        1,000 mg 200 mL/hr over 60 Minutes Intravenous Every M-W-F (Hemodialysis) 08/09/20 0812 08/10/20 1333   08/09/20 0100  metroNIDAZOLE (FLAGYL) tablet 500 mg        500 mg Oral Every 8 hours 08/09/20 0054 08/14/20 2359   08/08/20 2200  vancomycin (VANCOREADY) IVPB 2000 mg/400 mL         2,000 mg 200 mL/hr over 120 Minutes Intravenous  Once 08/08/20 2145 08/09/20 0143   08/08/20 2200  ceFEPIme (MAXIPIME) 2 g in sodium chloride 0.9 % 100 mL IVPB        2 g 200 mL/hr over 30 Minutes Intravenous  Once 08/08/20 2145 08/08/20 2333   08/08/20 2145  vancomycin (VANCOREADY) IVPB 1000 mg/200 mL  Status:  Discontinued        1,000 mg 200 mL/hr over 60 Minutes Intravenous  Once 08/08/20 2136  08/08/20 2145     Culture/Microbiology    Component Value Date/Time   SDES BLOOD LEFT HAND 08/16/2020 1804   SPECREQUEST  08/16/2020 1804    BOTTLES DRAWN AEROBIC ONLY Blood Culture adequate volume   CULT  08/16/2020 1804    NO GROWTH < 12 HOURS Performed at [facility], [place] [address], [place], [place] [address]    REPTSTATUS PENDING 08/16/2020 1804    Other culture-see note  Objective: Vitals: Today's Vitals   08/16/20 2348 08/17/20 0001 08/17/20 0442 08/17/20 0747  BP: 134/61  (!) 125/54 (!) 126/57  Pulse: 91  89 87  Resp: $Remo'20  19 20  'IphXW$ Temp: (!) 100.7 F (38.2 C)  99.3 F (37.4 C) 99.7 F (37.6 C)  TempSrc: Oral  Oral Oral  SpO2: 93%  94% 93%  Weight:   103.3 kg   Height:      PainSc:  Asleep      Intake/Output Summary (Last 24 hours) at 08/17/2020 0940 Last data filed at 08/17/2020 0848 Gross per 24 hour  Intake 240 ml  Output 2762 ml  Net -2522 ml   Filed Weights   08/16/20 0837 08/16/20 1243 08/17/20 0442  Weight: 102.3 kg 99.8 kg 103.3 kg   Weight change:   Intake/Output from previous day: 04/20 0701 - 04/21 0700 In: 0  Out: 2762  Intake/Output this shift: Total I/O In: 240 [P.O.:240] Out: -  Filed Weights   08/16/20 0837 08/16/20 1243 08/17/20 0442  Weight: 102.3 kg 99.8 kg 103.3 kg    Examination: General exam: AAOx3, pleasant, NAD, weak appearing. HEENT:Oral mucosa moist, Ear/Nose WNL grossly, dentition normal. Respiratory system: bilaterally clear,no wheezing or crackles,no use of accessory muscle Cardiovascular system: S1 & S2 +, No  JVD,. Gastrointestinal system: Abdomen soft, NT,ND, BS+ Nervous System:Alert, awake, moving extremities and grossly nonfocal Extremities: No edema, recent left BKA with dressing and knee immobilizer on the left, right foot TMA with dressing in place, wound VACto right foot Skin: No rashes,no icterus. MSK: Normal muscle bulk,tone, power  Data Reviewed: I have personally reviewed following labs and imaging studies CBC: Recent Labs  Lab 08/13/20 0124 08/14/20 0135 08/15/20 0215 08/16/20 0232 08/17/20 0233  WBC 20.5* 15.9* 14.5* 17.5* 18.0*  NEUTROABS 15.9* 11.4* 10.7* 14.0* 14.6*  HGB 7.4* 8.5* 7.7* 8.1* 8.3*  HCT 24.3* 28.3* 25.9* 26.3* 27.7*  MCV 81.8 82.5 82.2 81.2 82.9  PLT 653* 639* 671* 658* 097*   Basic Metabolic Panel: Recent Labs  Lab 08/12/20 0852 08/13/20 0124 08/14/20 0135 08/15/20 0644 08/16/20 0232 08/17/20 0233  NA 133* 133* 131* 132* 129* 131*  K 4.2 3.6 4.1 4.0 4.5 4.6  CL 97* 93* 92* 94* 92* 93*  CO2 $Re'23 29 24 27 27 28  'CzP$ GLUCOSE 237* 152* 108* 130* 164* 149*  BUN 53* 26* 40* 28* 42* 37*  CREATININE 8.76* 5.42* 7.19* 5.87* 7.42* 7.07*  CALCIUM 8.9 8.5* 8.4* 8.5* 8.5* 8.6*  PHOS 5.2*  --   --   --   --   --    GFR: Estimated Creatinine Clearance: 9.4 mL/min (A) (by C-G formula based on SCr of 7.07 mg/dL (H)). Liver Function Tests: Recent Labs  Lab 08/12/20 0852  ALBUMIN 2.1*   No results for input(s): LIPASE, AMYLASE in the last 168 hours. No results for input(s): AMMONIA in the last 168 hours. Coagulation Profile: No results for input(s): INR, PROTIME in the last 168 hours. Cardiac Enzymes: No results for input(s): CKTOTAL, CKMB, CKMBINDEX, TROPONINI in  the last 168 hours. BNP (last 3 results) No results for input(s): PROBNP in the last 8760 hours. HbA1C: No results for input(s): HGBA1C in the last 72 hours. CBG: Recent Labs  Lab 08/16/20 0609 08/16/20 1456 08/16/20 1610 08/16/20 2123 08/17/20 0558  GLUCAP 149* 160* 179* 235* 172*    Lipid Profile: No results for input(s): CHOL, HDL, LDLCALC, TRIG, CHOLHDL, LDLDIRECT in the last 72 hours. Thyroid Function Tests: No results for input(s): TSH, T4TOTAL, FREET4, T3FREE, THYROIDAB in the last 72 hours. Anemia Panel: No results for input(s): VITAMINB12, FOLATE, FERRITIN, TIBC, IRON, RETICCTPCT in the last 72 hours. Sepsis Labs: No results for input(s): PROCALCITON, LATICACIDVEN in the last 168 hours.  Recent Results (from the past 240 hour(s))  Blood culture (routine x 2)     Status: None   Collection Time: 08/08/20  8:44 PM   Specimen: BLOOD  Result Value Ref Range Status   Specimen Description BLOOD SITE NOT SPECIFIED  Final   Special Requests   Final    BOTTLES DRAWN AEROBIC AND ANAEROBIC Blood Culture results may not be optimal due to an inadequate volume of blood received in culture bottles   Culture   Final    NO GROWTH 5 DAYS Performed at [facility], [place] [address], [place], [place] [address]    Report Status 08/13/2020 FINAL  Final  SARS CORONAVIRUS 2 (TAT 6-24 HRS) Nasopharyngeal Nasopharyngeal Swab     Status: None   Collection Time: 08/08/20 10:31 PM   Specimen: Nasopharyngeal Swab  Result Value Ref Range Status   SARS Coronavirus 2 NEGATIVE NEGATIVE Final    Comment: (NOTE) SARS-CoV-2 target nucleic acids are NOT DETECTED.  The SARS-CoV-2 RNA is generally detectable in upper and lower respiratory specimens during the acute phase of infection. Negative results do not preclude SARS-CoV-2 infection, do not rule out co-infections with other pathogens, and should not be used as the sole basis for treatment or other patient management decisions. Negative results must be combined with clinical observations, patient history, and epidemiological information. The expected result is Negative.  Fact Sheet for Patients: SugarRoll.be  Fact Sheet for Healthcare Providers: https://www.woods-mathews.com/  This  test is not yet approved or cleared by the [place] FDA and  has been authorized for detection and/or diagnosis of SARS-CoV-2 by FDA under an Emergency Use Authorization (EUA). This EUA will remain  in effect (meaning this test can be used) for the duration of the COVID-19 declaration under Se ction 564(b)(1) of the Act, 21 U.S.C. section 360bbb-3(b)(1), unless the authorization is terminated or revoked sooner.  Performed at [facility], [place] [address], [place], [place] [address]   Surgical pcr screen     Status: None   Collection Time: 08/10/20  9:59 PM   Specimen: Nasal Mucosa; Nasal Swab  Result Value Ref Range Status   MRSA, PCR NEGATIVE NEGATIVE Final   Staphylococcus aureus NEGATIVE NEGATIVE Final    Comment: (NOTE) The Xpert SA Assay (FDA approved for NASAL specimens in patients 54 years of age and older), is one component of a comprehensive surveillance program. It is not intended to diagnose infection nor to guide or monitor treatment. Performed at [facility], [place] [address] [place], [place] [address]   Culture, blood (routine x 2)     Status: None (Preliminary result)   Collection Time: 08/13/20  1:24 AM   Specimen: BLOOD LEFT ARM  Result Value Ref Range Status   Specimen Description BLOOD LEFT ARM  Final   Special Requests   Final    BOTTLES DRAWN AEROBIC ONLY Blood Culture adequate volume   Culture   Final    NO GROWTH 4 DAYS Performed at [facility], [address] [place], [place] [address]    Report Status PENDING  Incomplete  Culture, blood (routine x 2)     Status: None (Preliminary result)   Collection Time: 08/13/20  1:32 AM   Specimen: BLOOD RIGHT HAND  Result Value Ref Range Status   Specimen Description BLOOD RIGHT HAND  Final   Special Requests   Final    BOTTLES DRAWN AEROBIC ONLY Blood Culture adequate volume   Culture   Final    NO GROWTH 4 DAYS Performed at [facility], [place] [address] [place], [place]  [address]    Report Status PENDING  Incomplete  Culture, blood (routine x 2)     Status: None (Preliminary result)   Collection Time: 08/16/20  6:00 PM   Specimen: BLOOD LEFT HAND  Result Value Ref Range Status   Specimen Description BLOOD LEFT HAND  Final   Special Requests   Final    BOTTLES DRAWN AEROBIC ONLY Blood Culture adequate volume   Culture   Final    NO GROWTH < 12 HOURS Performed at [facility], [place] [address], [place], [place] [address]    Report Status PENDING  Incomplete  Culture, blood (routine x 2)     Status: None (Preliminary result)   Collection Time: 08/16/20  6:04 PM   Specimen: BLOOD LEFT HAND  Result Value Ref Range Status   Specimen Description BLOOD LEFT HAND  Final   Special Requests   Final    BOTTLES DRAWN AEROBIC ONLY Blood Culture adequate volume   Culture   Final    NO GROWTH < 12 HOURS Performed at [facility], [place] [address], [place],  [address]    Report Status PENDING  Incomplete     Radiology Studies: No results found.   LOS: 9 days   Florencia Reasons, MD PhD FACP [facility]  08/17/2020, 9:40 AM

## 2020-08-17 NOTE — Progress Notes (Signed)
[facility] Progress Note   Assessment/ Plan:   Christy Zhang is a 89 yofs/p amputationwithESRDtreated with outpatient HD  1. 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 ESRD: - MWF outpatient schedule, plan to continue as inpatient. - Last HD tx 4/20completed; net UF 2762 mL. Scheduled length was 3.5 hours, but ended at 3.16 hrs due to clotted venous line. Next tx planned for tomorrow.  3. Hypertension: - Continue home metoprolol and Norvasc  4. Anemia of ESRD: - Aranesp 200 q Fri - Not candidate for IV iron with active infection. WBC improving but still elevated. - Transfusion if Hgb < 7  5. MBD of ESRD: - Continue Renvela and Hectorol  6. Hyponatremia - Na improved after HD yesterday. - Continue to monitor.    Subjective:   Chart review indicates Christy Zhang was fatigued and had low grade fevers yesterday afternoon, so blood cultures were ordered. On evaluation this morning, the patient appears well, sitting up in bed and smiling. When asked about feeling poorly yesterday, both she and RN in the room express belief that was due to a COVID-19 vaccination she had received on Tuesday. Christy Zhang states she feels much better currently and has no complaints.   Objective:   BP (!) 126/57 (BP Location: Left Arm)   Pulse 87   Temp 99.7 F (37.6 C) (Oral)   Resp 20   Ht '5\' 3"'$  (1.6 m)   Wt 103.3 kg   LMP  (LMP Unknown)   SpO2 93%   BMI 40.34 kg/m   Physical Exam: [id number] in hospital bed, in NAD CVS:RRR, no murmurs or rubs Resp:Unlabored; BS clear & equal bilaterally [id number], nontender [id number] BKA; right foot wound vac. Access: RUA AVG. Removed bandaging in place from HD yesterday. Good bruits.  Labs: BMET Recent Labs  Lab 08/11/20 0245 08/12/20 [id number] 08/13/20 0124 08/14/20 0135 08/15/20 0644 08/16/20 0232 08/17/20 0233  NA 132* 133* 133* 131* 132*  129* 131*  K 3.9 4.2 3.6 4.1 4.0 4.5 4.6  CL 96* 97* 93* 92* 94* 92* 93*  CO2 '26 23 29 24 27 27 28  '$ GLUCOSE 149* 237* 152* 108* 130* 164* 149*  BUN 32* 53* 26* 40* 28* 42* 37*  CREATININE 7.01* 8.76* 5.42* 7.19* 5.87* 7.42* 7.07*  CALCIUM 8.5* 8.9 8.5* 8.4* 8.5* 8.5* 8.6*  PHOS  --  5.2*  --   --   --   --   --    CBC Recent Labs  Lab 08/14/20 0135 08/15/20 0215 08/16/20 0232 08/17/20 0233  WBC 15.9* 14.5* 17.5* 18.0*  NEUTROABS 11.4* 10.7* 14.0* 14.6*  HGB 8.5* 7.7* 8.1* 8.3*  HCT 28.3* 25.9* 26.3* 27.7*  MCV 82.5 82.2 81.2 82.9  PLT 639* 671* 658* 597*      Medications:    . (feeding supplement) PROSource Plus  30 mL Oral BID BM  . amLODipine  5 mg Oral QHS  . aspirin EC  81 mg Oral QHS  . atorvastatin  40 mg Oral Daily  . Chlorhexidine Gluconate Cloth  6 each Topical Q0600  . clopidogrel  75 mg Oral QPM  . doxercalciferol  1.5 mcg Intravenous Q M,W,F-HD  . feeding supplement (NEPRO CARB STEADY)  237 mL Oral Q24H  . gabapentin  300 mg Oral QHS  . heparin  5,000 Units Subcutaneous Q8H  . insulin aspart  0-6 Units Subcutaneous TID WC  . insulin glargine  10 Units  Subcutaneous Daily  . metoprolol succinate  50 mg Oral Daily  . multivitamin  1 tablet Oral QHS  . polyethylene glycol  17 g Oral Daily  . senna-docusate  1 tablet Oral BID  . sevelamer carbonate  2,400 mg Oral TID WC  . sodium chloride flush  3 mL Intravenous Q12H     Nathanyal Ashmead J Adelena Desantiago 08/17/2020, 8:09 AM

## 2020-08-17 NOTE — Progress Notes (Signed)
   VASCULAR SURGERY ASSESSMENT & PLAN:   POD 6 RIGHT TMA: For VAC change tomorrow.  I will try to look at the wound tomorrow.  She has a low-grade fever.  T-max was 100.7.  Her white blood cell count today is 18,000.  Given the extent of the transmetatarsal amputation, if this is not healing adequately she would require a below the knee amputation.  VASCULAR: On 08/10/2020 she underwent angioplasty of the right anterior tibial artery which is her only runoff.  Thus the circulation is as good as it will get.  There are no further options for revascularization.   SUBJECTIVE:   No specific complaints.  PHYSICAL EXAM:   Vitals:   08/16/20 1615 08/16/20 2029 08/16/20 2348 08/17/20 0442  BP: (!) 111/56 (!) 116/56 134/61 (!) 125/54  Pulse: 93 92 91 89  Resp: '20 20 20 19  '$ Temp: (!) 100.5 F (38.1 C) 98.6 F (37 C) (!) 100.7 F (38.2 C) 99.3 F (37.4 C)  TempSrc: Oral Oral Oral Oral  SpO2: 96% 97% 93% 94%  Weight:    103.3 kg  Height:       The right foot is warm and well-perfused. The VAC has a good seal.   LABS:   Lab Results  Component Value Date   WBC 18.0 (H) 08/17/2020   HGB 8.3 (L) 08/17/2020   HCT 27.7 (L) 08/17/2020   MCV 82.9 08/17/2020   PLT 597 (H) 08/17/2020   Lab Results  Component Value Date   CREATININE 7.07 (H) 08/17/2020   Lab Results  Component Value Date   INR 1.2 08/10/2020   CBG (last 3)  Recent Labs    08/16/20 1610 08/16/20 2123 08/17/20 0558  GLUCAP 179* 235* 172*    PROBLEM LIST:    Principal Problem:   Osteomyelitis of right foot (HCC) Active Problems:   ESRD (end stage renal disease) ([place])   Controlled type 2 diabetes mellitus with chronic kidney disease on chronic dialysis, with long-term current use of insulin (HCC)   Essential (primary) hypertension   Thrombocytosis   Pressure injury of skin   Diabetic foot infection ([place])   Medication monitoring encounter   CURRENT MEDS:   . (feeding supplement) PROSource Plus  30 mL  Oral BID BM  . amLODipine  5 mg Oral QHS  . aspirin EC  81 mg Oral QHS  . atorvastatin  40 mg Oral Daily  . Chlorhexidine Gluconate Cloth  6 each Topical Q0600  . clopidogrel  75 mg Oral QPM  . doxercalciferol  1.5 mcg Intravenous Q M,W,F-HD  . feeding supplement (NEPRO CARB STEADY)  237 mL Oral Q24H  . gabapentin  300 mg Oral QHS  . heparin  5,000 Units Subcutaneous Q8H  . insulin aspart  0-6 Units Subcutaneous TID WC  . insulin glargine  10 Units Subcutaneous Daily  . metoprolol succinate  50 mg Oral Daily  . multivitamin  1 tablet Oral QHS  . polyethylene glycol  17 g Oral Daily  . senna-docusate  1 tablet Oral BID  . sevelamer carbonate  2,400 mg Oral TID WC  . sodium chloride flush  3 mL Intravenous Q12H    Deitra Mayo Office: [phone] 08/17/2020

## 2020-08-17 NOTE — Progress Notes (Signed)
Mobility Specialist: Progress Note   08/17/20 1446  Mobility  Activity Stood at bedside  Level of Assistance +2 (takes two people)  Company secretary Response Tolerated fair  Mobility performed by Mobility specialist  $Mobility charge 1 Mobility   Pre-Mobility: 78 HR, 120/55 BP, 98% SpO2 Post-Mobility: 74 HR, 131/61 BP, 95% SpO2  Pt required +2 assistance to stand using darco shoe. Pt was assisted to stand by NT and myself on either side. Pt was able to stand <10 seconds before sitting due to fatigue and unable to properly adjust darco shoe while standing. Pt back to bed with bed alarm on.   [facility] Lalita Ebel Mobility Specialist Mobility Specialist Phone: [phone]

## 2020-08-17 NOTE — Progress Notes (Signed)
Notified by CCMD '@[id number]'$ , patient had 7 beat run non-sustain SVT. Patient asymptomatic. Will continue to monitor

## 2020-08-18 DIAGNOSIS — M86171 Other acute osteomyelitis, right ankle and foot: Secondary | ICD-10-CM | POA: Diagnosis not present

## 2020-08-18 LAB — CBC WITH DIFFERENTIAL/PLATELET
Abs Immature Granulocytes: 0.15 10*3/uL — ABNORMAL HIGH (ref 0.00–0.07)
Basophils Absolute: 0 10*3/uL (ref 0.0–0.1)
Basophils Relative: 0 %
Eosinophils Absolute: 1 10*3/uL — ABNORMAL HIGH (ref 0.0–0.5)
Eosinophils Relative: 6 %
HCT: 23.5 % — ABNORMAL LOW (ref 36.0–46.0)
Hemoglobin: 7.2 g/dL — ABNORMAL LOW (ref 12.0–15.0)
Immature Granulocytes: 1 %
Lymphocytes Relative: 11 %
Lymphs Abs: 1.7 10*3/uL (ref 0.7–4.0)
MCH: 25 pg — ABNORMAL LOW (ref 26.0–34.0)
MCHC: 30.6 g/dL (ref 30.0–36.0)
MCV: 81.6 fL (ref 80.0–100.0)
Monocytes Absolute: 1.1 10*3/uL — ABNORMAL HIGH (ref 0.1–1.0)
Monocytes Relative: 7 %
Neutro Abs: 11.8 10*3/uL — ABNORMAL HIGH (ref 1.7–7.7)
Neutrophils Relative %: 75 %
Platelets: 524 10*3/uL — ABNORMAL HIGH (ref 150–400)
RBC: 2.88 MIL/uL — ABNORMAL LOW (ref 3.87–5.11)
RDW: 18.4 % — ABNORMAL HIGH (ref 11.5–15.5)
WBC: 15.8 10*3/uL — ABNORMAL HIGH (ref 4.0–10.5)
nRBC: 0 % (ref 0.0–0.2)

## 2020-08-18 LAB — CULTURE, BLOOD (ROUTINE X 2)
Culture: NO GROWTH
Culture: NO GROWTH
Special Requests: ADEQUATE
Special Requests: ADEQUATE

## 2020-08-18 LAB — GLUCOSE, CAPILLARY
Glucose-Capillary: 157 mg/dL — ABNORMAL HIGH (ref 70–99)
Glucose-Capillary: 148 mg/dL — ABNORMAL HIGH (ref 70–99)

## 2020-08-18 MED ORDER — OXYCODONE HCL 5 MG PO TABS: 5.0000 mg | ORAL_TABLET | Freq: Four times a day (QID) | ORAL | 0 refills | Status: DC | PRN

## 2020-08-18 MED ORDER — DOXERCALCIFEROL 4 MCG/2ML IV SOLN
INTRAVENOUS | Status: AC
  Filled 2020-08-18: qty 2

## 2020-08-18 NOTE — Progress Notes (Signed)
   VASCULAR SURGERY ASSESSMENT & PLAN:   POD 7 RIGHT TMA: I inspected her open right TMA today.  There is some tissue that looks reasonably well perfused.  However the dorsum of the incision is somewhat darkened and I am concerned that she is at high risk for this not healing.  I have told her that I think she has a less than 50% chance of this healing but that if she felt strongly about trying to save the leg, it would be reasonable to continue with the [facility] for now and follow this as an outpatient.  If this fails to heal she would require a BKA.  VASCULAR: On 08/10/2020 she underwent angioplasty of the right anterior tibial artery which is her only runoff.  Thus the circulation is as good as it will get.  There are no further options for revascularization.  SUBJECTIVE:   No complaints.  PHYSICAL EXAM:   Vitals:   08/18/20 0930 08/18/20 1000 08/18/20 1015 08/18/20 1043  BP: (!) 97/51 (!) 95/54 106/61 (!) 110/52  Pulse:    88  Resp:   18 15  Temp:    98.9 F (37.2 C)  TempSrc:    Oral  SpO2:   100% 96%  Weight:   98.1 kg   Height:       Her open TMA has some reasonably well perfused tissue. She has a brisk anterior tibial signal with a Doppler.  This is her only runoff.     LABS:   Lab Results  Component Value Date   WBC 15.8 (H) 08/18/2020   HGB 7.2 (L) 08/18/2020   HCT 23.5 (L) 08/18/2020   MCV 81.6 08/18/2020   PLT 524 (H) 08/18/2020   CBG (last 3)  Recent Labs    08/17/20 2148 08/18/20 0618 08/18/20 1120  GLUCAP 202* 157* 148*    PROBLEM LIST:    Principal Problem:   Osteomyelitis of right foot ([place]) Active Problems:   ESRD (end stage renal disease) ([place])   Controlled type 2 diabetes mellitus with chronic kidney disease on chronic dialysis, with long-term current use of insulin (HCC)   Essential (primary) hypertension   Thrombocytosis   Pressure injury of skin   Diabetic foot infection ([place])   Medication monitoring encounter   CURRENT MEDS:   .  (feeding supplement) PROSource Plus  30 mL Oral BID BM  . amLODipine  5 mg Oral QHS  . aspirin EC  81 mg Oral QHS  . atorvastatin  40 mg Oral Daily  . Chlorhexidine Gluconate Cloth  6 each Topical Q0600  . Chlorhexidine Gluconate Cloth  6 each Topical Q0600  . clopidogrel  75 mg Oral QPM  . doxercalciferol  1.5 mcg Intravenous Q M,W,F-HD  . feeding supplement (NEPRO CARB STEADY)  237 mL Oral Q24H  . gabapentin  300 mg Oral QHS  . heparin  5,000 Units Subcutaneous Q8H  . insulin aspart  0-6 Units Subcutaneous TID WC  . insulin glargine  10 Units Subcutaneous Daily  . metoprolol succinate  50 mg Oral Daily  . multivitamin  1 tablet Oral QHS  . polyethylene glycol  17 g Oral Daily  . senna-docusate  1 tablet Oral BID  . sevelamer carbonate  2,400 mg Oral TID WC  . sodium chloride flush  3 mL Intravenous Q12H    Deitra Mayo Office: [phone] 08/18/2020

## 2020-08-18 NOTE — Progress Notes (Signed)
D/C instructions given to patient. Home wound vac applied with good seal. Wound education completed. Medication reviewed, all questions answered. Pt to be escort home by family with all belongings.  Clyde Canterbury, RN

## 2020-08-18 NOTE — Consult Note (Signed)
[facility] Nurse wound follow up Patient receiving care in [place]. Wound type: right foot surgical wound Measurement:5.3 cm x 7.2 cm x 0.8 cm Wound bed: dorsal margin, approximately 1/4 of total wound bed, dry, darkened, along with darkened skin along the dorsum of the foot. Drainage (amount, consistency, odor) none in cannister Periwound: as described above; intact Dressing procedure/placement/frequency: one piece of black foam placed over wound bed. Drape applied, immediate seal obtained. Patient tolerated with pain. Val Riles, RN, MSN, CWOCN, CNS-BC, pager [phone]

## 2020-08-18 NOTE — TOC Transition Note (Signed)
Transition of Care (TOC) - CM/SW Discharge Note Marvetta Gibbons RN, BSN Transitions of Care Unit 4E- RN Case Manager See Treatment Team for direct phone #    Patient Details  Name: Christy Zhang MRN: [id number] Date of Birth: Sep 12, 1955  Transition of Care [facility]) CM/SW Contact:  Dawayne Patricia, RN Phone Number: 08/18/2020, 10:48 PM   Clinical Narrative:    Vascular has cleared pt to return home with home wound VAC and [place] f/u. [employer] home wound vac is at bedside for transition home. Bedside RN can change over VAC to home VAC for discharge and send pt home with box for home supplies.  Have notified Ramond Marrow with [facility] for start of [facility] services next Tues for home wound VAC drsg changes as pt has HD M/W/F.      Final next level of care: [place] Barriers to Discharge: Barriers Resolved   Patient Goals and CMS Choice Patient states their goals for this hospitalization and ongoing recovery are:: return home CMS Medicare.gov Compare Post Acute Care list provided to:: Patient Choice offered to / list presented to : Patient  Discharge Placement               Home with [facility]        Discharge Plan and Services   Discharge Planning Services: CM Consult Post Acute Care Choice: Home Health,Resumption of Svcs/PTA Provider          DME Arranged: Vac DME Agency: [facility] Date DME Agency Contacted: 08/15/20 Time DME Agency Contacted: 1600 Representative spoke with at DME Agency: [place]: RN [place] Agency: [place] ([place]) Date [place]: 08/15/20 Time [place]: 1400 Representative spoke with at [place]: [place] Determinants of Health ([place]) Interventions     Readmission Risk Interventions No flowsheet data found.

## 2020-08-18 NOTE — Progress Notes (Signed)
Mobility Specialist: Progress Note   08/18/20 1416  Mobility  Activity Dangled on edge of bed  Level of Assistance +2 (takes two people)  Games developer wheel walker  Mobility Response Tolerated poorly  Mobility performed by Mobility specialist  $Mobility charge 1 Mobility   Post-Mobility: 87 HR  Pt stood x2 with assistance from NT and myself. With assistance pt was able to better stand on darco shoe today. Pt stood for 10-15 seconds on the first attempt and <10 seconds on the second attempt. Pt c/o 8/10 pain in R foot after session, RN notified. Pt back in bed with call bell and lunch tray.   [facility] Christy Zhang Mobility Specialist Mobility Specialist Phone: [phone]

## 2020-08-18 NOTE — Progress Notes (Cosign Needed)
   [facility] Progress Note   Assessment/ Plan:   Madiline Eustice is a 32 yofs/p amputationwithESRDtreated with outpatient HD  1. 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 ESRD: - MWF outpatient schedule, plan to continue as inpatient. - Last HD tx 4/20completed; net UF 2762 mL. Next tx planned for today 4/22.  3. Hypertension: - Continue home metoprolol and Norvasc  4. Anemia of ESRD: - Aranesp 200 q Fri - Not candidate for IV iron with active infection. WBC improving but still elevated. - Transfusion if Hgb < 7  5. MBD of ESRD: - ContinueRenvela andHectorol  6. Hyponatremia - Na improved after HD yesterday. - Continue to monitor.   Subjective:   Ms. Haraway continues to feel well, with no complaints.  Per notes, discharge is delayed out of caution due to elevated WBC.   Objective:   BP (!) 146/61 (BP Location: Left Arm)   Pulse 84   Temp 98 F (36.7 C) (Oral)   Resp 16   Ht '5\' 3"'$  (1.6 m)   Wt 103.3 kg   LMP  (LMP Unknown)   SpO2 96%   BMI 40.34 kg/m   Physical Exam: [id number] in hospital bed, in NAD CVS:RRR, no murmurs or rubs Resp:Unlabored; BS clear & equal bilaterally [id number], nontender [id number] BKA; right foot wound vac. Access: RUA AVG. Good bruits.  Labs: BMET Recent Labs  Lab 08/12/20 0852 08/13/20 0124 08/14/20 0135 08/15/20 0644 08/16/20 0232 08/17/20 0233  NA 133* 133* 131* 132* 129* 131*  K 4.2 3.6 4.1 4.0 4.5 4.6  CL 97* 93* 92* 94* 92* 93*  CO2 '23 29 24 27 27 28  '$ GLUCOSE 237* 152* 108* 130* 164* 149*  BUN 53* 26* 40* 28* 42* 37*  CREATININE 8.76* 5.42* 7.19* 5.87* 7.42* 7.07*  CALCIUM 8.9 8.5* 8.4* 8.5* 8.5* 8.6*  PHOS 5.2*  --   --   --   --   --    CBC Recent Labs  Lab 08/15/20 0215 08/16/20 0232 08/17/20 0233 08/18/20 0123  WBC 14.5* 17.5* 18.0* 15.8*  NEUTROABS 10.7* 14.0* 14.6* 11.8*  HGB 7.7* 8.1* 8.3* 7.2*   HCT 25.9* 26.3* 27.7* 23.5*  MCV 82.2 81.2 82.9 81.6  PLT 671* 658* 597* 524*      Medications:    . (feeding supplement) PROSource Plus  30 mL Oral BID BM  . amLODipine  5 mg Oral QHS  . aspirin EC  81 mg Oral QHS  . atorvastatin  40 mg Oral Daily  . Chlorhexidine Gluconate Cloth  6 each Topical Q0600  . Chlorhexidine Gluconate Cloth  6 each Topical Q0600  . clopidogrel  75 mg Oral QPM  . doxercalciferol  1.5 mcg Intravenous Q M,W,F-HD  . feeding supplement (NEPRO CARB STEADY)  237 mL Oral Q24H  . gabapentin  300 mg Oral QHS  . heparin  5,000 Units Subcutaneous Q8H  . insulin aspart  0-6 Units Subcutaneous TID WC  . insulin glargine  10 Units Subcutaneous Daily  . metoprolol succinate  50 mg Oral Daily  . multivitamin  1 tablet Oral QHS  . polyethylene glycol  17 g Oral Daily  . senna-docusate  1 tablet Oral BID  . sevelamer carbonate  2,400 mg Oral TID WC  . sodium chloride flush  3 mL Intravenous Q12H     Treven Holtman J Taejon Irani 08/18/2020, 8:08 AM

## 2020-08-18 NOTE — Progress Notes (Signed)
PT Cancellation Note  Patient Details Name: ANETRIA CONDRA MRN: [id number] DOB: March 01, 1956   Cancelled Treatment:    Reason Eval/Treat Not Completed: (P) Patient at procedure or test/unavailable (pt at HD dept.) Will continue efforts per PT POC as schedule permits.   [place] Dittus M Alva Kuenzel 08/18/2020, 10:05 AM

## 2020-08-18 NOTE — Discharge Summary (Signed)
Discharge Summary  Christy Zhang [id number] DOB: 1955/08/20  PCP: Claretta Fraise, MD  Admit date: 08/08/2020 Discharge date: 08/18/2020  Time spent:  77mns  Recommendations for Outpatient Follow-up:  1. F/u with PCP within a week  for hospital discharge follow up, repeat cbc/bmp at follow up 2. F/u with vascular surgery 3. F/u with ID Dr SBaxter Flattery 4. F/u with nephrology , continue HD MWF 5. Home health arranged    Continue all home meds  oxycodone '5mg'$  q6hr prn for breakthrough pain,  total of 10 tabs prescribed per patient 's request   Discharge Diagnoses:  Active Hospital Problems   Diagnosis Date Noted  . Osteomyelitis of right foot ([facility] 08/08/2020  . Diabetic foot infection ([facility]   . Medication monitoring encounter   . Pressure injury of skin 08/11/2020  . Thrombocytosis 08/09/2020  . Controlled type 2 diabetes mellitus with chronic kidney disease on chronic dialysis, with long-term current use of insulin ([facility] 12/02/2016  . ESRD (end stage renal disease) ([facility]   . Essential (primary) hypertension 06/24/2014    Resolved Hospital Problems  No resolved problems to display.    Discharge Condition: stable  Diet recommendation: renal diet /carb modified  Filed Weights   08/18/20 0457 08/18/20 0700 08/18/20 1015  Weight: 103.3 kg 100.8 kg 98.1 kg    History of present illness: ( per admitting MD Dr OMyna Hidalgo Patient coming from: Home   Chief Complaint: Right foot discoloration and drainage   HPI: Christy BURKHOLDERis a 65y.o. female with medical history significant for ESRD on hemodialysis, insulin-dependent diabetes mellitus, hypertension, chronic pain, peripheral arterial disease, and right foot infection, now presenting to emergency department for evaluation of progressive discoloration and drainage involving the right foot.  Patient has been following with vascular surgery, was scheduled for arteriogram on 08/10/2020, and had just completed a course of Keflex,  but continued to have progressive discoloration of the right forefoot with increasing drainage from the third toe, prompting her presentation to the ED.  She denies any fevers, chills, or pain.  She reports completing dialysis without incident yesterday.  She denies any chest pain, cough, or shortness of breath.  ED Course: Upon arrival to the ED, patient is found to be afebrile, saturating well on room air, and with blood pressure 175/63.  EKG features a sinus rhythm.  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  Blood culture was collected in the ED, cefepime and vancomycin were administered, vascular surgery was consulted by the ED physician, and hospitalists asked to admit.  Hospital Course:  Principal Problem:   Osteomyelitis of right foot ([facility] Active Problems:   ESRD (end stage renal disease) ([facility]   Controlled type 2 diabetes mellitus with chronic kidney disease on chronic dialysis, with long-term current use of insulin (H[facility])   Essential (primary) hypertension   Thrombocytosis   Pressure injury of skin   Diabetic foot infection ([facility]   Medication monitoring encounter  Acute Limb Ischemia/ Right Lower Extremity Osteomyelitis s/p 4/15 Right openTMA with wound vac in place: - - Wound Cultures are NG.  Blood Cultures are NG.  Antibiotics were discontinued per Infectious Disease, appreciate. -Leukocytosis improving, no fever last 48hrs -Patient  is cleared to discharge home with wound vac by vascular surgery, she will follow up with Vascular Surgery outpatient.   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 - Continue local wound care. F/u with vascular surgery  ESRD and HD MWF:  - Nephrology is following, appreciate.  Anemia of CKD: on ESA. Hyperparathyroidism: on Renvela and Hectorol.  Diabetes Mellitus Type 2 on long term insulin, uncontrolled with hyperglycemis :  A1C is 8.8%.. - Continue home insulin regimen and trulicity   Essential (primary) hypertension:  - Continue Metoprolol and Amlodipine.  Morbid obesity with BMI 38 kg/m2: - Counseled on weight loss through diet and lifestyle modification.  Stage 2 Pressure Ulcer located at left buttocks, POA: - Perform local wound care.    DVT prophylaxis while in the hospital: heparin injection 5,000 Units Start: 08/09/20 1400 Code Status:   Code Status: Full Code  Family Communication: she declined my offer to call her family, she states she will update her family      Procedures:  As above  Consultations:  Vascular surgery  Wound care  ID  nephrology  Discharge Exam: BP (!) 110/52 (BP Location: Left Arm)   Pulse 88   Temp 98.9 F (37.2 C) (Oral)   Resp 15   Ht '5\' 3"'$  (1.6 m)   Wt 98.1 kg   LMP  (LMP Unknown)   SpO2 96%   BMI 38.31 kg/m   General: NAD, pleasant, she is happy that she can go home today Cardiovascular: RRR Respiratory: normal respiratory effort   Discharge Instructions You were cared for by a hospitalist during your hospital stay. If you have any questions about your discharge medications or the care you received while you were in the hospital after you are discharged, you can call the unit and asked to speak with the hospitalist on call if the hospitalist that took care of you is not available. Once you are discharged, your primary care physician will handle any further medical issues. Please note that NO REFILLS for any discharge medications will be authorized once you are discharged, as it is imperative that you return to your primary care physician (or establish  a relationship with a primary care physician if you do not have one) for your aftercare needs so that they can reassess your need for medications and monitor your lab values.  Discharge Instructions    Ambulatory referral to Infectious Disease   Complete by: As directed    Ambulatory referral to Vascular Surgery   Complete by: As directed    Diet - low sodium heart healthy   Complete by: As directed    Renal diet/carb modified   Discharge wound care:   Complete by: As directed    Continue wound  vac, follow vascular surgery instruction regarding wound care   Increase activity slowly   Complete by: As directed      Allergies as of 08/18/2020      Reactions   Sulfa Antibiotics Swelling      Medication List    STOP taking these medications   HYDROcodone-acetaminophen 5-325 MG tablet Commonly known as: NORCO/VICODIN     TAKE these medications   (feeding supplement) PROSource Plus liquid Take 30 mLs by mouth 2 (two) times daily between meals.   acetaminophen 325 MG tablet Commonly known as: TYLENOL Take 1-2 tablets (325-650 mg total) by mouth every 4 (four) hours as needed for mild pain.   amLODipine 5 MG tablet Commonly  known as: NORVASC Take 1 tablet (5 mg total) by mouth at bedtime.   aspirin EC 81 MG tablet Take 81 mg by mouth at bedtime. Swallow whole.   atorvastatin 40 MG tablet Commonly known as: LIPITOR Take 1 tablet (40 mg total) by mouth daily. What changed: when to take this   clopidogrel 75 MG tablet Commonly known as: Plavix Take 1 tablet (75 mg total) by mouth daily. What changed: when to take this   diclofenac Sodium 1 % Gel Commonly known as: VOLTAREN Apply 2 g topically 4 (four) times daily.   diphenhydrAMINE 25 MG tablet Commonly known as: BENADRYL Take 25 mg by mouth every 6 (six) hours as needed for itching.   gabapentin 300 MG capsule Commonly known as: NEURONTIN Take 1 capsule (300 mg total) by mouth at bedtime.   insulin glargine (1  Unit Dial) 300 UNIT/ML Solostar Pen Commonly known as: TOUJEO Inject 16 Units into the skin daily.   metoprolol succinate 50 MG 24 hr tablet Commonly known as: TOPROL-XL Take 1 tablet (50 mg total) by mouth daily. Take with or immediately following a meal.   multivitamin Tabs tablet Take 1 tablet by mouth at bedtime.   oxyCODONE 5 MG immediate release tablet Commonly known as: Oxy IR/ROXICODONE Take 1 tablet (5 mg total) by mouth every 6 (six) hours as needed for severe pain.   polyethylene glycol 17 g packet Commonly known as: MIRALAX / GLYCOLAX Take 17 g by mouth daily.   senna-docusate 8.6-50 MG tablet Commonly known as: Senokot-S Take 1 tablet by mouth 2 (two) times daily.   sevelamer carbonate 800 MG tablet Commonly known as: RENVELA Take 1,600-2,400 mg by mouth See admin instructions. Take 2400 mg with each meal and 1600 mg with each snack   traMADol 50 MG tablet Commonly known as: ULTRAM Take 1 tablet (50 mg total) by mouth every 12 (twelve) hours as needed for moderate pain.   Trulicity 1.5 0000000 Sopn Generic drug: Dulaglutide Inject 1.5 mg into the skin once a week. Saturday            Discharge Care Instructions  (From admission, onward)         Start     Ordered   08/18/20 0000  Discharge wound care:       Comments: Continue wound  vac, follow vascular surgery instruction regarding wound care   08/18/20 1327         Allergies  Allergen Reactions  . Sulfa Antibiotics Swelling    Follow-up Information    [facility].   Specialty: Home Health Services Why: HHRN (added PT/OT/aide) services resumed- will provide needed drsg changes for home VAC ([facility] home wound VAC arranged for home VAC needs)       Claretta Fraise, MD Follow up in 1 week(s).   Specialty: Family Medicine Why: hospital discharge follow up Contact information: [place] [address] [phone]        Angelia Mould, MD.  Call in 1 week(s).   Specialties: Vascular Surgery, Cardiology Why: f/u on right foot wound Contact information: [place] [address] [phone]        Carlyle Basques, MD Follow up.   Specialty: Infectious Diseases Contact information: [place] [address] [phone]                The results of significant diagnostics from this hospitalization (including imaging, microbiology, ancillary and  laboratory) are listed below for reference.    Significant Diagnostic Studies: PERIPHERAL VASCULAR CATHETERIZATION  Result Date: 08/10/2020 Patient name: ZINDA VAZIRI         MRN: [id number]        DOB: 14-Jan-1956          Sex: female  08/10/2020 Pre-operative Diagnosis: Critical limb ischemia of the right lower extremity with tissue loss Post-operative diagnosis:  Same Surgeon:  [name], MD Procedure Performed: 1.  Ultrasound-guided access of left common femoral artery 2.  Aortogram including catheter selection of aorta 3.  Right lower extremity arteriogram with selection of third order branches 4.  Right anterior tibial artery angioplasty (3 mm x 220 mm Sterling) 5.  Mynx closure of the left common femoral artery 6.  44 minutes of monitored moderate conscious sedation time  Indications: Patient is a 65 year old female with multiple medical comorbidities including diabetes and end-stage renal disease who presents for right lower extremity arteriogram and intervention given history of PAD with known tibial disease and right foot tissue loss.  She presents today after risk benefits discussed.  Findings:  Aortogram showed approximate 50% stenosis of right renal artery while the left renal artery appears widely patent (she is ESRD).  Otherwise the aortoiliac segment was widely patent with no flow limiting stenosis.  Right lower extremity arteriogram showed a patent common femoral, profunda, SFA, above and below-knee popliteal  artery.  Patient has severe tibial disease.  She has single-vessel runoff via an anterior tibial that had recurrent high-grade stenosis with multiple focal lesions 50 to 70% stenosis throughout the vessel with flow into the dorsalis pedis in the foot.  Peroneal and posterior tibial arteries appeared occluded.  Ultimately the right anterior tibial artery was crossed into the foot and the entire anterior tibial artery was angioplastied with a 3 mm Sterling to nominal pressure for 2 minutes.  Excellent results.  No residual stenosis.  Preserved runoff down the anterior tibial with filling of the dorsalis pedis.             Procedure:  The patient was identified in the holding area and taken to room 8.  The patient was then placed supine on the table and prepped and draped in the usual sterile fashion.  A time out was called.  Ultrasound was used to evaluate the left common femoral artery.  It was patent .  A digital ultrasound image was acquired.  A micropuncture needle was used to access the left common femoral artery under ultrasound guidance.  An 018 wire was advanced without resistance and a micropuncture sheath was placed.  The 018 wire was removed and a benson wire was placed.  The micropuncture sheath was exchanged for a 5 french sheath.  An omniflush catheter was advanced over the wire to the level of L-1.  An abdominal angiogram was obtained.  Next, using the omniflush catheter and a benson wire, the aortic bifurcation was crossed and the catheter was placed into theright external iliac artery and right runoff was obtained.  After evaluating images elected to intervene on her right anterior tibial artery where she had recurrent disease.  Ultimately used a Rosen wire down the right SFA and exchanged for a 5 [place] Ansell sheath in the left groin over the aortic bifurcation.  Patient was given 100 units/kg herparinIV.  I then used a CXI catheter with a V 18 and I was able to cross the SFA popliteal into the  anterior tibial and got my  wire into the foot where I confirmed on hand-injection that I was in the true lumen.  I then used a 3 mm x 220 mm Sterling and the entire anterior tibial was angioplastied to nominal pressure for 2 minutes.  No residual disease with good runoff into the foot.  That point time wires and catheters were removed exchanged for a short 5 French sheath in the left groin.  A mynx closure device was deployed.   Plan: Patient will be posted for right TMA tomorrow with Dr. Donzetta Matters.  This will be very high risk given the right foot plantar surface soft tissue is marginal.  She is not optimized from a vascular surgery standpoint.  [name], MD [facility] Office: [place] 2 Views Right  Result Date: 08/08/2020 CLINICAL DATA:  Redness and drainage right foot EXAM: RIGHT FOOT - 2 VIEW COMPARISON:  02/22/2020 FINDINGS: Bones appear osteopenic. Old fracture deformity base of fifth metatarsal. Apparent partial amputation of third digit with residual proximal phalanx. Slight erosive change at the cut end of the proximal phalanx. Suspected bony resorption of the second distal phalanx. Possible erosions at the base of the second and third proximal phalanges and head of third metatarsal. Vascular calcifications. 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 IMPRESSION: 1. Apparent interval partial amputation third digit with remnant proximal phalanx. Findings suspicious for osteomyelitis involving the distal phalanx of the second digit and the cut end of the residual third proximal phalanx. There may be additional erosive change involving the bases of the second and third proximal phalanges and head of third metatarsal. 2. 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 Electronically Signed   By: Donavan Foil M.D.   On: 08/08/2020 21:01    Microbiology: Recent Results (from the past 240 hour(s))  Blood culture (routine x 2)     Status: None   Collection Time: 08/08/20  8:44 PM   Specimen: BLOOD  Result Value Ref Range Status   Specimen Description BLOOD SITE NOT SPECIFIED  Final   Special Requests   Final    BOTTLES DRAWN AEROBIC AND ANAEROBIC Blood Culture results may not be optimal due to an inadequate volume of blood received in culture bottles   Culture   Final    NO GROWTH 5 DAYS Performed at [facility], [place] [address] [place], [place] [address]    Report Status 08/13/2020 FINAL  Final  SARS CORONAVIRUS 2 (TAT 6-24 HRS) Nasopharyngeal Nasopharyngeal Swab     Status: None   Collection Time: 08/08/20 10:31 PM   Specimen: Nasopharyngeal Swab  Result Value Ref Range Status   SARS Coronavirus 2 NEGATIVE NEGATIVE Final    Comment: (NOTE) SARS-CoV-2 target nucleic acids are NOT DETECTED.  The SARS-CoV-2 RNA is generally detectable in upper and lower respiratory specimens during the acute phase of infection. Negative results do not preclude SARS-CoV-2 infection, do not rule out co-infections with other pathogens, and should not be used as the sole basis for treatment or other patient management decisions. Negative results must be combined with clinical observations, patient history, and epidemiological information. The expected result is Negative.  Fact Sheet for Patients: SugarRoll.be  Fact Sheet for Healthcare Providers: https://www.woods-mathews.com/  This test is not yet approved or cleared by the [place] FDA and  has been authorized for detection and/or diagnosis of SARS-CoV-2  by FDA under an Emergency Use Authorization (EUA). This EUA will remain  in effect (meaning this test can be used) for the duration of the COVID-19 declaration under Se ction 564(b)(1) of the Act, 21 U.S.C. section 360bbb-3(b)(1), unless  the authorization is terminated or revoked sooner.  Performed at [facility], [place] [address], [place], [place] [address]   Surgical pcr screen     Status: None   Collection Time: 08/10/20  9:59 PM   Specimen: Nasal Mucosa; Nasal Swab  Result Value Ref Range Status   MRSA, PCR NEGATIVE NEGATIVE Final   Staphylococcus aureus NEGATIVE NEGATIVE Final    Comment: (NOTE) The Xpert SA Assay (FDA approved for NASAL specimens in patients 38 years of age and older), is one component of a comprehensive surveillance program. It is not intended to diagnose infection nor to guide or monitor treatment. Performed at [facility], [place] [address], [place], [place] [address]   Culture, blood (routine x 2)     Status: None   Collection Time: 08/13/20  1:24 AM   Specimen: BLOOD LEFT ARM  Result Value Ref Range Status   Specimen Description BLOOD LEFT ARM  Final   Special Requests   Final    BOTTLES DRAWN AEROBIC ONLY Blood Culture adequate volume   Culture   Final    NO GROWTH 5 DAYS Performed at [facility], [address], [place], [place] [address]    Report Status 08/18/2020 FINAL  Final  Culture, blood (routine x 2)     Status: None   Collection Time: 08/13/20  1:32 AM   Specimen: BLOOD RIGHT HAND  Result Value Ref Range Status   Specimen Description BLOOD RIGHT HAND  Final   Special Requests   Final    BOTTLES DRAWN AEROBIC ONLY Blood Culture adequate volume   Culture   Final    NO GROWTH 5 DAYS Performed at [facility], [place] [address], [place], [place] [address]    Report Status 08/18/2020 FINAL  Final  Culture, blood (routine x 2)     Status: None (Preliminary result)   Collection Time: 08/16/20  6:00 PM   Specimen: BLOOD LEFT HAND  Result Value Ref Range Status   Specimen Description BLOOD LEFT HAND  Final   Special Requests   Final    BOTTLES DRAWN AEROBIC ONLY Blood Culture adequate volume   Culture   Final    NO GROWTH 2 DAYS Performed at [facility], [place] [address] [place], [place] [address]    Report Status PENDING  Incomplete  Culture, blood (routine x 2)     Status: None (Preliminary result)   Collection Time: 08/16/20  6:04 PM   Specimen: BLOOD LEFT HAND  Result Value Ref Range Status   Specimen Description BLOOD LEFT HAND  Final   Special Requests   Final    BOTTLES DRAWN AEROBIC ONLY Blood Culture adequate volume   Culture   Final    NO GROWTH 2 DAYS Performed at [facility], [place] [address], [place],  [address]    Report Status PENDING  Incomplete     Labs: Basic Metabolic Panel: Recent Labs  Lab 08/12/20 0852 08/13/20 0124 08/14/20 0135 08/15/20 0644 08/16/20 0232 08/17/20 0233  NA 133* 133* 131* 132* 129* 131*  K 4.2 3.6 4.1 4.0 4.5 4.6  CL 97* 93* 92* 94* 92* 93*  CO2 '23 29 24 27 27 '$ 28  GLUCOSE 237* 152* 108* 130* 164* 149*  BUN 53* 26* 40* 28* 42* 37*  CREATININE 8.76* 5.42* 7.19* 5.87* 7.42* 7.07*  CALCIUM 8.9 8.5* 8.4* 8.5* 8.5* 8.6*  PHOS 5.2*  --   --   --   --   --    Liver Function Tests: Recent Labs  Lab 08/12/20 0852  ALBUMIN 2.1*   No results for input(s): LIPASE, AMYLASE in the last 168 hours. No results for input(s): AMMONIA in the last 168 hours. CBC: Recent Labs  Lab 08/14/20 0135 08/15/20 0215 08/16/20 0232 08/17/20 0233 08/18/20 0123  WBC 15.9* 14.5* 17.5* 18.0* 15.8*  NEUTROABS 11.4* 10.7* 14.0* 14.6* 11.8*  HGB 8.5* 7.7* 8.1* 8.3* 7.2*  HCT 28.3* 25.9* 26.3* 27.7* 23.5*  MCV 82.5 82.2 81.2 82.9 81.6  PLT 639* 671* 658* 597* 524*   Cardiac Enzymes: No results for input(s): CKTOTAL, CKMB, CKMBINDEX, TROPONINI in the last 168 hours. BNP: BNP (last 3 results) No results for input(s): BNP in the last 8760 hours.  ProBNP (last 3 results) No results for input(s): PROBNP in the last 8760 hours.  CBG: Recent Labs  Lab 08/17/20 1123 08/17/20 1650 08/17/20 2148 08/18/20 0618 08/18/20 1120  GLUCAP 212* 182* 202* 157* 148*        Signed:  Florencia Reasons MD, PhD, FACP  [facility] 08/18/2020, 2:00 PM

## 2020-08-18 NOTE — Progress Notes (Signed)
Physical Therapy Treatment Patient Details Name: Christy Zhang MRN: [id number] DOB: 1955-08-03 Today's Date: 08/18/2020    History of Present Illness 65 yo female presents to ED on 4/12 with worsening R foot infection, with new onset purulent discharge from 4th R toe. Xray of R foot shows osteomyelitis of 2nd-3rd toes, erosive changes at base of 2nd and 3rd phalanges. s/p RLE arteriogram, R anterior tibial artery angioplasty, mynx closure of L common femoral artery on 4/15. s/p R transmet amputation on 4/15. 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    PT Comments    Pt received in supine, agreeable to therapy session with encouragement and with good participation and tolerance for transfer training. Pt able to perform slide board transfer with +1-2 min/modA with darco shoe donned, needing increased assist (modA) for return transfer chair>bed due to incline. She did need some cues for proper technique but was receptive to instruction with fair carryover, RN notified of tips for technique as she may DC later today. Pt continues to benefit from PT services to progress toward functional mobility goals. Continue to recommend HHPT.  Follow Up Recommendations  Home health PT;Supervision for mobility/OOB     Equipment Recommendations  None recommended by PT    Recommendations for Other Services       Precautions / Restrictions Precautions Precautions: Fall Precaution Comments: L BKA (06/08/20) Restrictions Weight Bearing Restrictions: Yes RLE Weight Bearing: Partial weight bearing RLE Partial Weight Bearing Percentage or Pounds: through heel in darco shoe LLE Weight Bearing: Non weight bearing    Mobility  Bed Mobility Overal bed mobility: Needs Assistance Bed Mobility: Supine to Sit;Sit to Supine     Supine to sit: Min guard;HOB elevated Sit to supine: Min guard        Transfers Overall transfer level: Needs  assistance Equipment used: Sliding board Transfers: Lateral/Scoot Transfers          Lateral/Scoot Transfers: Min assist;Mod assist;With slide board;+2 safety/equipment General transfer comment: increased time to perform, min cues for technique, needs +36mnA at most for bed>chair but up to modA for chair>bed due to slight incline up to bed  Ambulation/Gait                 Stairs             Wheelchair Mobility    Modified Rankin (Stroke Patients Only)       Balance Overall balance assessment: Needs assistance Sitting-balance support: No upper extremity supported;Feet supported Sitting balance-Leahy Scale: Fair Sitting balance - Comments: able to sit EOB unsupported and perfoms lateral leans with elbow taps fairly steadily, no posterior LOB       Standing balance comment: not assessed                            Cognition Arousal/Alertness: Awake/alert Behavior During Therapy: WFL for tasks assessed/performed Overall Cognitive Status: No family/caregiver present to determine baseline cognitive functioning                                 General Comments: Pt with slow response to some questions, unsure if delayed processing/cognition.      Exercises      General Comments General comments (skin integrity, edema, etc.): HR to 94 bpm with mobility      Pertinent Vitals/Pain  Pain Assessment: 0-10 Pain Score: 6  Pain Location: R residual foot Pain Descriptors / Indicators: Sore;Discomfort;Guarding Pain Intervention(s): Monitored during session;Premedicated before session;Repositioned           PT Goals (current goals can now be found in the care plan section) Acute Rehab PT Goals Patient Stated Goal: go home PT Goal Formulation: With patient Time For Goal Achievement: 08/30/20 Potential to Achieve Goals: Good Progress towards PT goals: Progressing toward goals    Frequency    Min 3X/week      PT Plan Current  plan remains appropriate       AM-PAC PT "6 Clicks" Mobility   Outcome Measure  Help needed turning from your back to your side while in a flat bed without using bedrails?: None Help needed moving from lying on your back to sitting on the side of a flat bed without using bedrails?: A Little Help needed moving to and from a bed to a chair (including a wheelchair)?: A Little Help needed standing up from a chair using your arms (e.g., wheelchair or bedside chair)?: A Lot Help needed to walk in hospital room?: A Lot Help needed climbing 3-5 steps with a railing? : Total 6 Click Score: 15    End of Session Equipment Utilized During Treatment: Other (comment);Gait belt (darco shoe) Activity Tolerance: Patient tolerated treatment well Patient left: in bed;with bed alarm set;with call bell/phone within reach Nurse Communication: Mobility status;Other (comment);Weight bearing status (how to help her with sliding board) PT Visit Diagnosis: Other abnormalities of gait and mobility (R26.89);Difficulty in walking, not elsewhere classified (R26.2);Muscle weakness (generalized) (M62.81)     Time: [id number] PT Time Calculation (min) (ACUTE ONLY): 23 min  Charges:  $Therapeutic Activity: 23-37 mins                     Westlyn Glaza P., PTA Acute Rehabilitation Services Pager: [phone] Office: [place] 08/18/2020, 3:28 PM

## 2020-08-18 NOTE — Care Management Important Message (Signed)
Important Message  Patient Details  Name: Christy Zhang MRN: [id number] Date of Birth: 02/21/1956   Medicare Important Message Given:  Yes     Shelda Altes 08/18/2020, 10:53 AM

## 2020-08-18 NOTE — Progress Notes (Signed)
Pt received from HD. VSS. Breakfast tray ordered. Call light in reach.  Clyde Canterbury, RN

## 2020-08-18 NOTE — Plan of Care (Signed)
  Problem: Health Behavior/Discharge Planning: Goal: Ability to manage health-related needs will improve Outcome: Not Progressing   

## 2020-08-19 DIAGNOSIS — N2581 Secondary hyperparathyroidism of renal origin: Secondary | ICD-10-CM | POA: Diagnosis not present

## 2020-08-19 DIAGNOSIS — M1990 Unspecified osteoarthritis, unspecified site: Secondary | ICD-10-CM | POA: Diagnosis not present

## 2020-08-19 DIAGNOSIS — Z89512 Acquired absence of left leg below knee: Secondary | ICD-10-CM | POA: Diagnosis not present

## 2020-08-19 DIAGNOSIS — Z7982 Long term (current) use of aspirin: Secondary | ICD-10-CM | POA: Diagnosis not present

## 2020-08-19 DIAGNOSIS — D631 Anemia in chronic kidney disease: Secondary | ICD-10-CM | POA: Diagnosis not present

## 2020-08-19 DIAGNOSIS — I70261 Atherosclerosis of native arteries of extremities with gangrene, right leg: Secondary | ICD-10-CM | POA: Diagnosis not present

## 2020-08-19 DIAGNOSIS — E1152 Type 2 diabetes mellitus with diabetic peripheral angiopathy with gangrene: Secondary | ICD-10-CM | POA: Diagnosis not present

## 2020-08-19 DIAGNOSIS — E785 Hyperlipidemia, unspecified: Secondary | ICD-10-CM | POA: Diagnosis not present

## 2020-08-19 DIAGNOSIS — I110 Hypertensive heart disease with heart failure: Secondary | ICD-10-CM | POA: Diagnosis not present

## 2020-08-19 DIAGNOSIS — Z992 Dependence on renal dialysis: Secondary | ICD-10-CM | POA: Diagnosis not present

## 2020-08-19 DIAGNOSIS — I509 Heart failure, unspecified: Secondary | ICD-10-CM | POA: Diagnosis not present

## 2020-08-19 DIAGNOSIS — E1169 Type 2 diabetes mellitus with other specified complication: Secondary | ICD-10-CM | POA: Diagnosis not present

## 2020-08-19 DIAGNOSIS — Z794 Long term (current) use of insulin: Secondary | ICD-10-CM | POA: Diagnosis not present

## 2020-08-19 DIAGNOSIS — D75839 Thrombocytosis, unspecified: Secondary | ICD-10-CM | POA: Diagnosis not present

## 2020-08-19 DIAGNOSIS — G709 Myoneural disorder, unspecified: Secondary | ICD-10-CM | POA: Diagnosis not present

## 2020-08-19 DIAGNOSIS — M869 Osteomyelitis, unspecified: Secondary | ICD-10-CM | POA: Diagnosis not present

## 2020-08-19 DIAGNOSIS — Z4781 Encounter for orthopedic aftercare following surgical amputation: Secondary | ICD-10-CM | POA: Diagnosis not present

## 2020-08-19 DIAGNOSIS — E1122 Type 2 diabetes mellitus with diabetic chronic kidney disease: Secondary | ICD-10-CM | POA: Diagnosis not present

## 2020-08-19 DIAGNOSIS — Z6836 Body mass index (BMI) 36.0-36.9, adult: Secondary | ICD-10-CM | POA: Diagnosis not present

## 2020-08-19 DIAGNOSIS — N186 End stage renal disease: Secondary | ICD-10-CM | POA: Diagnosis not present

## 2020-08-19 DIAGNOSIS — Z79891 Long term (current) use of opiate analgesic: Secondary | ICD-10-CM | POA: Diagnosis not present

## 2020-08-19 DIAGNOSIS — Z4801 Encounter for change or removal of surgical wound dressing: Secondary | ICD-10-CM | POA: Diagnosis not present

## 2020-08-19 DIAGNOSIS — J45909 Unspecified asthma, uncomplicated: Secondary | ICD-10-CM | POA: Diagnosis not present

## 2020-08-19 DIAGNOSIS — Z89431 Acquired absence of right foot: Secondary | ICD-10-CM | POA: Diagnosis not present

## 2020-08-21 ENCOUNTER — Telehealth: Payer: Self-pay | Admitting: *Deleted

## 2020-08-21 ENCOUNTER — Other Ambulatory Visit: Payer: Self-pay | Admitting: Family Medicine

## 2020-08-21 DIAGNOSIS — E1169 Type 2 diabetes mellitus with other specified complication: Secondary | ICD-10-CM

## 2020-08-21 DIAGNOSIS — D509 Iron deficiency anemia, unspecified: Secondary | ICD-10-CM | POA: Diagnosis not present

## 2020-08-21 DIAGNOSIS — Z4801 Encounter for change or removal of surgical wound dressing: Secondary | ICD-10-CM | POA: Diagnosis not present

## 2020-08-21 DIAGNOSIS — I70261 Atherosclerosis of native arteries of extremities with gangrene, right leg: Secondary | ICD-10-CM | POA: Diagnosis not present

## 2020-08-21 DIAGNOSIS — D631 Anemia in chronic kidney disease: Secondary | ICD-10-CM | POA: Diagnosis not present

## 2020-08-21 DIAGNOSIS — E1152 Type 2 diabetes mellitus with diabetic peripheral angiopathy with gangrene: Secondary | ICD-10-CM | POA: Diagnosis not present

## 2020-08-21 DIAGNOSIS — Z992 Dependence on renal dialysis: Secondary | ICD-10-CM | POA: Diagnosis not present

## 2020-08-21 DIAGNOSIS — Z4781 Encounter for orthopedic aftercare following surgical amputation: Secondary | ICD-10-CM | POA: Diagnosis not present

## 2020-08-21 DIAGNOSIS — E785 Hyperlipidemia, unspecified: Secondary | ICD-10-CM

## 2020-08-21 DIAGNOSIS — N2581 Secondary hyperparathyroidism of renal origin: Secondary | ICD-10-CM | POA: Diagnosis not present

## 2020-08-21 DIAGNOSIS — E559 Vitamin D deficiency, unspecified: Secondary | ICD-10-CM | POA: Diagnosis not present

## 2020-08-21 DIAGNOSIS — N186 End stage renal disease: Secondary | ICD-10-CM | POA: Diagnosis not present

## 2020-08-21 DIAGNOSIS — M869 Osteomyelitis, unspecified: Secondary | ICD-10-CM | POA: Diagnosis not present

## 2020-08-21 LAB — CULTURE, BLOOD (ROUTINE X 2)
Culture: NO GROWTH
Culture: NO GROWTH
Special Requests: ADEQUATE
Special Requests: ADEQUATE

## 2020-08-21 NOTE — Telephone Encounter (Signed)
Keep the Toujeo at 16 units.  Monitor sugar closely fasting and postprandial.  If it starts to climb we can increase it.  Have the labs done at her next dialysis on Wednesday.

## 2020-08-21 NOTE — Telephone Encounter (Signed)
[place] aware

## 2020-08-21 NOTE — Telephone Encounter (Signed)
Tried to call to schedule hospital follow up, no answer, voicemail full

## 2020-08-21 NOTE — Telephone Encounter (Signed)
Otila Kluver nurse w/ [facility] Admitted pt to services this weekend Pt needs hospital follow-up this week Needs clarification on Toujeo dosage, pt has been taking 100 u & we have in chart 16 u Also needing CBC & BMP, she goes to dialysis can this been done while there She is at dialysis today till about noon

## 2020-08-22 ENCOUNTER — Telehealth: Payer: Self-pay

## 2020-08-22 DIAGNOSIS — M869 Osteomyelitis, unspecified: Secondary | ICD-10-CM | POA: Diagnosis not present

## 2020-08-22 DIAGNOSIS — E1169 Type 2 diabetes mellitus with other specified complication: Secondary | ICD-10-CM | POA: Diagnosis not present

## 2020-08-22 DIAGNOSIS — Z4801 Encounter for change or removal of surgical wound dressing: Secondary | ICD-10-CM | POA: Diagnosis not present

## 2020-08-22 DIAGNOSIS — I70261 Atherosclerosis of native arteries of extremities with gangrene, right leg: Secondary | ICD-10-CM | POA: Diagnosis not present

## 2020-08-22 DIAGNOSIS — Z4781 Encounter for orthopedic aftercare following surgical amputation: Secondary | ICD-10-CM | POA: Diagnosis not present

## 2020-08-22 DIAGNOSIS — E1152 Type 2 diabetes mellitus with diabetic peripheral angiopathy with gangrene: Secondary | ICD-10-CM | POA: Diagnosis not present

## 2020-08-22 NOTE — Telephone Encounter (Signed)
Appointment scheduled 08/25/20 at 2:10 pm with Dr. Livia Snellen, patient aware.

## 2020-08-22 NOTE — Telephone Encounter (Signed)
Patient's daughter returned call to schedule appointment.  Scheduled on 08/25/20 at 2:10 pm with Dr. Livia Snellen, patient aware.

## 2020-08-23 DIAGNOSIS — M869 Osteomyelitis, unspecified: Secondary | ICD-10-CM | POA: Diagnosis not present

## 2020-08-23 DIAGNOSIS — N2581 Secondary hyperparathyroidism of renal origin: Secondary | ICD-10-CM | POA: Diagnosis not present

## 2020-08-23 DIAGNOSIS — Z4781 Encounter for orthopedic aftercare following surgical amputation: Secondary | ICD-10-CM | POA: Diagnosis not present

## 2020-08-23 DIAGNOSIS — I70261 Atherosclerosis of native arteries of extremities with gangrene, right leg: Secondary | ICD-10-CM | POA: Diagnosis not present

## 2020-08-23 DIAGNOSIS — Z992 Dependence on renal dialysis: Secondary | ICD-10-CM | POA: Diagnosis not present

## 2020-08-23 DIAGNOSIS — D509 Iron deficiency anemia, unspecified: Secondary | ICD-10-CM | POA: Diagnosis not present

## 2020-08-23 DIAGNOSIS — N186 End stage renal disease: Secondary | ICD-10-CM | POA: Diagnosis not present

## 2020-08-23 DIAGNOSIS — E1152 Type 2 diabetes mellitus with diabetic peripheral angiopathy with gangrene: Secondary | ICD-10-CM | POA: Diagnosis not present

## 2020-08-23 DIAGNOSIS — Z4801 Encounter for change or removal of surgical wound dressing: Secondary | ICD-10-CM | POA: Diagnosis not present

## 2020-08-23 DIAGNOSIS — D631 Anemia in chronic kidney disease: Secondary | ICD-10-CM | POA: Diagnosis not present

## 2020-08-23 DIAGNOSIS — E1169 Type 2 diabetes mellitus with other specified complication: Secondary | ICD-10-CM | POA: Diagnosis not present

## 2020-08-23 DIAGNOSIS — E559 Vitamin D deficiency, unspecified: Secondary | ICD-10-CM | POA: Diagnosis not present

## 2020-08-24 ENCOUNTER — Other Ambulatory Visit: Payer: Self-pay | Admitting: Family Medicine

## 2020-08-24 DIAGNOSIS — Z4801 Encounter for change or removal of surgical wound dressing: Secondary | ICD-10-CM | POA: Diagnosis not present

## 2020-08-24 DIAGNOSIS — I70261 Atherosclerosis of native arteries of extremities with gangrene, right leg: Secondary | ICD-10-CM | POA: Diagnosis not present

## 2020-08-24 DIAGNOSIS — Z992 Dependence on renal dialysis: Secondary | ICD-10-CM

## 2020-08-24 DIAGNOSIS — E1152 Type 2 diabetes mellitus with diabetic peripheral angiopathy with gangrene: Secondary | ICD-10-CM | POA: Diagnosis not present

## 2020-08-24 DIAGNOSIS — E1122 Type 2 diabetes mellitus with diabetic chronic kidney disease: Secondary | ICD-10-CM

## 2020-08-24 DIAGNOSIS — Z4781 Encounter for orthopedic aftercare following surgical amputation: Secondary | ICD-10-CM | POA: Diagnosis not present

## 2020-08-24 DIAGNOSIS — E1169 Type 2 diabetes mellitus with other specified complication: Secondary | ICD-10-CM | POA: Diagnosis not present

## 2020-08-24 DIAGNOSIS — M869 Osteomyelitis, unspecified: Secondary | ICD-10-CM | POA: Diagnosis not present

## 2020-08-25 ENCOUNTER — Other Ambulatory Visit: Payer: Self-pay

## 2020-08-25 ENCOUNTER — Ambulatory Visit (INDEPENDENT_AMBULATORY_CARE_PROVIDER_SITE_OTHER): Payer: Medicare Other | Admitting: Family Medicine

## 2020-08-25 ENCOUNTER — Encounter: Payer: Self-pay | Admitting: Family Medicine

## 2020-08-25 VITALS — BP 151/71 | HR 101 | Temp 97.8°F | Resp 20 | Ht 63.0 in | Wt 216.0 lb

## 2020-08-25 DIAGNOSIS — E1122 Type 2 diabetes mellitus with diabetic chronic kidney disease: Secondary | ICD-10-CM

## 2020-08-25 DIAGNOSIS — M86279 Subacute osteomyelitis, unspecified ankle and foot: Secondary | ICD-10-CM

## 2020-08-25 DIAGNOSIS — E1165 Type 2 diabetes mellitus with hyperglycemia: Secondary | ICD-10-CM | POA: Diagnosis not present

## 2020-08-25 DIAGNOSIS — E559 Vitamin D deficiency, unspecified: Secondary | ICD-10-CM | POA: Diagnosis not present

## 2020-08-25 DIAGNOSIS — IMO0002 Reserved for concepts with insufficient information to code with codable children: Secondary | ICD-10-CM

## 2020-08-25 DIAGNOSIS — N185 Chronic kidney disease, stage 5: Secondary | ICD-10-CM | POA: Diagnosis not present

## 2020-08-25 DIAGNOSIS — N186 End stage renal disease: Secondary | ICD-10-CM | POA: Diagnosis not present

## 2020-08-25 DIAGNOSIS — N2581 Secondary hyperparathyroidism of renal origin: Secondary | ICD-10-CM | POA: Diagnosis not present

## 2020-08-25 DIAGNOSIS — Z09 Encounter for follow-up examination after completed treatment for conditions other than malignant neoplasm: Secondary | ICD-10-CM

## 2020-08-25 DIAGNOSIS — D509 Iron deficiency anemia, unspecified: Secondary | ICD-10-CM | POA: Diagnosis not present

## 2020-08-25 DIAGNOSIS — D631 Anemia in chronic kidney disease: Secondary | ICD-10-CM | POA: Diagnosis not present

## 2020-08-25 DIAGNOSIS — Z992 Dependence on renal dialysis: Secondary | ICD-10-CM | POA: Diagnosis not present

## 2020-08-25 NOTE — Progress Notes (Signed)
Subjective:  Patient ID: Christy Zhang, female    DOB: Sep 23, 1955  Age: 65 y.o. MRN: [id number]  CC: Hospitalization Follow-up ([facility] 4/12-4/22 Dx. Osteomyelitis right foot)   HPI Christy Zhang presents for follow up of hospitalization for osteomyelitis. She had an amputation of the right forefoot. Pt. Continues with dialysis MWF. She has an appointment with ID in about 5 days. Home health is helping with the wound care She is afebrile.   Pt. Followed for DM. Aic on 3/17 was 8.2. Denies lows and excessive highs.   Depression screen [facility] 2/9 08/25/2020 07/27/2020 07/27/2020  Decreased Interest 0 0 0  Down, Depressed, Hopeless 0 0 0  PHQ - 2 Score 0 0 0  Altered sleeping - 0 -  Tired, decreased energy - 0 -  Change in appetite - 0 -  Feeling bad or failure about yourself  - 0 -  Trouble concentrating - 0 -  Moving slowly or fidgety/restless - 0 -  Suicidal thoughts - 0 -  PHQ-9 Score - 0 -  Difficult doing work/chores - Not difficult at all -    History Christy Zhang has a past medical history of Anemia, Arthritis, Asthma, CHF (congestive heart failure) ([place]), Chronic kidney disease, Diabetes mellitus with complication ([place]), End stage renal disease ([place]), End stage renal disease on dialysis ([place]), Gangrene ([place]), Hemodialysis access, arteriovenous graft ([place]), Hyperglycemia due to diabetes mellitus ([place]) (10/15/2019), Hyperlipidemia, Hypertension, and Neuromuscular disorder ([place]).   She has a past surgical history that includes Insertion of dialysis catheter; Tubal ligation; AV fistula placement (Right, 08/25/2014); Fistula superficialization (Right, 11/03/2014); Ligation of competing branches of arteriovenous fistula (Right, 11/03/2014); Fistulogram (Right, 01/16/2016); A/V Fistulagram (Right, 02/04/2017); PERIPHERAL VASCULAR BALLOON ANGIOPLASTY (Right, 02/04/2017); IR THROMBECTOMY AV FISTULA/W THROMBOLYSIS/PTA INC SHUNT/IMG RIGHT (Right, 12/03/2018); IR US Guide Vasc Access Right (12/03/2018); IR  Fluoro Guide CV Line Right (12/03/2018); IR US Guide Vasc Access Right (12/03/2018); Bascilic vein transposition (Right, 12/24/2018); AV fistula placement (Right, 03/18/2019); IR Removal Tun Cv Cath W/O FL (07/06/2019); IR THROMBECTOMY AV FISTULA W/THROMBOLYSIS/PTA/STENT INC SHUNT/IMG RT (Right, 10/11/2019); IR US Guide Vasc Access Right (10/14/2019); ABDOMINAL AORTOGRAM W/LOWER EXTREMITY (N/A, 11/18/2019); PERIPHERAL VASCULAR BALLOON ANGIOPLASTY (Right, 11/18/2019); ABDOMINAL AORTOGRAM W/LOWER EXTREMITY (N/A, 02/23/2020); PERIPHERAL VASCULAR BALLOON ANGIOPLASTY (02/23/2020); Transmetatarsal amputation (Left, 02/24/2020);  LEFT BELOW KNEE AMPUTATION (Left Knee) (06/08/2020); Amputation (Left, 06/08/2020); ABDOMINAL AORTOGRAM W/LOWER EXTREMITY (N/A, 08/10/2020); PERIPHERAL VASCULAR INTERVENTION (Right, 08/10/2020); and Transmetatarsal amputation (Right, 08/11/2020).   Her family history includes Cancer in her father; Diabetes in her daughter, father, mother, and son; Heart disease in her daughter and son; Hypertension in her daughter, mother, sister, and son; Peripheral vascular disease in her son.She reports that she quit smoking about 36 years ago. Her smoking use included cigarettes. She has never used smokeless tobacco. She reports that she does not drink alcohol and does not use drugs.    ROS Review of Systems  Constitutional: Negative.   HENT: Negative.   Eyes: Negative for visual disturbance.  Respiratory: Negative for shortness of breath.   Cardiovascular: Negative for chest pain.  Gastrointestinal: Negative for abdominal pain.  Musculoskeletal: Negative for arthralgias.    Objective:  BP (!) 151/71   Pulse (!) 101   Temp 97.8 F (36.6 C)   Resp 20   Ht '5\' 3"'$  (1.6 m)   Wt 216 lb (98 kg)   LMP  (LMP Unknown)   SpO2 97%   BMI 38.26 kg/m   BP Readings from Last 3 Encounters:  08/25/20 Marland Kitchen)  151/71  08/18/20 (!) 110/52  07/27/20 130/67    Wt Readings from Last 3 Encounters:  08/25/20 216 lb  (98 kg)  08/18/20 216 lb 4.3 oz (98.1 kg)  07/25/20 235 lb (106.6 kg)     Physical Exam Constitutional:      General: She is not in acute distress.    Appearance: She is well-developed.  HENT:     Head: Normocephalic and atraumatic.  Eyes:     Conjunctiva/sclera: Conjunctivae normal.     Pupils: Pupils are equal, round, and reactive to light.  Neck:     Thyroid: No thyromegaly.  Cardiovascular:     Rate and Rhythm: Normal rate and regular rhythm.     Heart sounds: Normal heart sounds. No murmur heard.   Pulmonary:     Effort: Pulmonary effort is normal. No respiratory distress.     Breath sounds: Normal breath sounds. No wheezing or rales.  Abdominal:     General: Bowel sounds are normal. There is no distension.     Palpations: Abdomen is soft.     Tenderness: There is no abdominal tenderness.  Musculoskeletal:        General: Deformity (bilateral amputation. right forefoot and left lo BKA.) present. Normal range of motion.     Cervical back: Normal range of motion and neck supple.  Lymphadenopathy:     Cervical: No cervical adenopathy.  Skin:    General: Skin is warm and dry.  Neurological:     Mental Status: She is alert and oriented to person, place, and time.  Psychiatric:        Behavior: Behavior normal.        Thought Content: Thought content normal.        Judgment: Judgment normal.       Assessment & Plan:   Christy Zhang was seen today for hospitalization follow-up.  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 I am also having her maintain her sevelamer carbonate, multivitamin, diphenhydrAMINE, clopidogrel, aspirin EC, polyethylene glycol, senna-docusate, metoprolol succinate,  acetaminophen, amLODipine, insulin glargine (1 Unit Dial), gabapentin, traMADol, oxyCODONE, atorvastatin, and Trulicity.  Allergies as of 08/25/2020      Reactions   Sulfa Antibiotics Swelling      Medication List       Accurate as of August 25, 2020 11:59 PM. If you have any questions, ask your nurse or doctor.        STOP taking these medications   (feeding supplement) PROSource Plus liquid Stopped by: Claretta Fraise, MD   diclofenac Sodium 1 % Gel Commonly known as: VOLTAREN Stopped by: Claretta Fraise, MD     TAKE these medications   acetaminophen 325 MG tablet Commonly known as: TYLENOL Take 1-2 tablets (325-650 mg total) by mouth every 4 (four) hours as needed for mild pain.   amLODipine 5 MG tablet Commonly known as: NORVASC Take 1 tablet (5 mg total) by mouth at bedtime.   aspirin EC 81 MG tablet Take 81 mg by mouth at bedtime. Swallow whole.   atorvastatin 40 MG tablet Commonly known as: LIPITOR TAKE ONE TABLET BY MOUTH DAILY   clopidogrel 75 MG tablet Commonly known as: Plavix Take 1 tablet (75 mg total) by mouth daily. What changed: when to  take this   diphenhydrAMINE 25 MG tablet Commonly known as: BENADRYL Take 25 mg by mouth every 6 (six) hours as needed for itching.   gabapentin 300 MG capsule Commonly known as: NEURONTIN Take 1 capsule (300 mg total) by mouth at bedtime.   insulin glargine (1 Unit Dial) 300 UNIT/ML Solostar Pen Commonly known as: TOUJEO Inject 16 Units into the skin daily.   metoprolol succinate 50 MG 24 hr tablet Commonly known as: TOPROL-XL Take 1 tablet (50 mg total) by mouth daily. Take with or immediately following a meal.   multivitamin Tabs tablet Take 1 tablet by mouth at bedtime.   oxyCODONE 5 MG immediate release tablet Commonly known as: Oxy IR/ROXICODONE Take 1 tablet (5 mg total) by mouth every 6 (six) hours as needed for severe pain.   polyethylene glycol 17 g packet Commonly known as: MIRALAX /  GLYCOLAX Take 17 g by mouth daily.   senna-docusate 8.6-50 MG tablet Commonly known as: Senokot-S Take 1 tablet by mouth 2 (two) times daily.   sevelamer carbonate 800 MG tablet Commonly known as: RENVELA Take 1,600-2,400 mg by mouth See admin instructions. Take 2400 mg with each meal and 1600 mg with each snack   traMADol 50 MG tablet Commonly known as: ULTRAM Take 1 tablet (50 mg total) by mouth every 12 (twelve) hours as needed for moderate pain.   Trulicity 1.5 0000000 Sopn Generic drug: Dulaglutide INJECT CONTENTs OF ONE PEN UNDER THE SKIN WEEKLY ON SATURDAY        Follow-up: No follow-ups on file.  Claretta Fraise, M.D.

## 2020-08-26 DIAGNOSIS — I70261 Atherosclerosis of native arteries of extremities with gangrene, right leg: Secondary | ICD-10-CM | POA: Diagnosis not present

## 2020-08-26 DIAGNOSIS — Z4801 Encounter for change or removal of surgical wound dressing: Secondary | ICD-10-CM | POA: Diagnosis not present

## 2020-08-26 DIAGNOSIS — Z4781 Encounter for orthopedic aftercare following surgical amputation: Secondary | ICD-10-CM | POA: Diagnosis not present

## 2020-08-26 DIAGNOSIS — Z992 Dependence on renal dialysis: Secondary | ICD-10-CM | POA: Diagnosis not present

## 2020-08-26 DIAGNOSIS — M869 Osteomyelitis, unspecified: Secondary | ICD-10-CM | POA: Diagnosis not present

## 2020-08-26 DIAGNOSIS — E1152 Type 2 diabetes mellitus with diabetic peripheral angiopathy with gangrene: Secondary | ICD-10-CM | POA: Diagnosis not present

## 2020-08-26 DIAGNOSIS — E1169 Type 2 diabetes mellitus with other specified complication: Secondary | ICD-10-CM | POA: Diagnosis not present

## 2020-08-26 DIAGNOSIS — N186 End stage renal disease: Secondary | ICD-10-CM | POA: Diagnosis not present

## 2020-08-28 ENCOUNTER — Encounter: Payer: Self-pay | Admitting: Family Medicine

## 2020-08-28 DIAGNOSIS — E1169 Type 2 diabetes mellitus with other specified complication: Secondary | ICD-10-CM | POA: Diagnosis not present

## 2020-08-28 DIAGNOSIS — Z4801 Encounter for change or removal of surgical wound dressing: Secondary | ICD-10-CM | POA: Diagnosis not present

## 2020-08-28 DIAGNOSIS — N2581 Secondary hyperparathyroidism of renal origin: Secondary | ICD-10-CM | POA: Diagnosis not present

## 2020-08-28 DIAGNOSIS — Z4781 Encounter for orthopedic aftercare following surgical amputation: Secondary | ICD-10-CM | POA: Diagnosis not present

## 2020-08-28 DIAGNOSIS — Z992 Dependence on renal dialysis: Secondary | ICD-10-CM | POA: Diagnosis not present

## 2020-08-28 DIAGNOSIS — E1152 Type 2 diabetes mellitus with diabetic peripheral angiopathy with gangrene: Secondary | ICD-10-CM | POA: Diagnosis not present

## 2020-08-28 DIAGNOSIS — I70261 Atherosclerosis of native arteries of extremities with gangrene, right leg: Secondary | ICD-10-CM | POA: Diagnosis not present

## 2020-08-28 DIAGNOSIS — N186 End stage renal disease: Secondary | ICD-10-CM | POA: Diagnosis not present

## 2020-08-28 DIAGNOSIS — D631 Anemia in chronic kidney disease: Secondary | ICD-10-CM | POA: Diagnosis not present

## 2020-08-28 DIAGNOSIS — M869 Osteomyelitis, unspecified: Secondary | ICD-10-CM | POA: Diagnosis not present

## 2020-08-29 ENCOUNTER — Encounter: Payer: Self-pay | Admitting: Internal Medicine

## 2020-08-29 ENCOUNTER — Telehealth: Payer: Self-pay | Admitting: *Deleted

## 2020-08-29 ENCOUNTER — Ambulatory Visit (INDEPENDENT_AMBULATORY_CARE_PROVIDER_SITE_OTHER): Payer: Medicare Other | Admitting: Internal Medicine

## 2020-08-29 ENCOUNTER — Other Ambulatory Visit: Payer: Self-pay

## 2020-08-29 VITALS — BP 168/78 | HR 102 | Temp 98.3°F

## 2020-08-29 DIAGNOSIS — Z4801 Encounter for change or removal of surgical wound dressing: Secondary | ICD-10-CM | POA: Diagnosis not present

## 2020-08-29 DIAGNOSIS — M86171 Other acute osteomyelitis, right ankle and foot: Secondary | ICD-10-CM | POA: Diagnosis not present

## 2020-08-29 DIAGNOSIS — M869 Osteomyelitis, unspecified: Secondary | ICD-10-CM | POA: Diagnosis not present

## 2020-08-29 DIAGNOSIS — E1169 Type 2 diabetes mellitus with other specified complication: Secondary | ICD-10-CM | POA: Diagnosis not present

## 2020-08-29 DIAGNOSIS — Z4781 Encounter for orthopedic aftercare following surgical amputation: Secondary | ICD-10-CM | POA: Diagnosis not present

## 2020-08-29 DIAGNOSIS — I10 Essential (primary) hypertension: Secondary | ICD-10-CM

## 2020-08-29 DIAGNOSIS — I70261 Atherosclerosis of native arteries of extremities with gangrene, right leg: Secondary | ICD-10-CM | POA: Diagnosis not present

## 2020-08-29 DIAGNOSIS — E1152 Type 2 diabetes mellitus with diabetic peripheral angiopathy with gangrene: Secondary | ICD-10-CM | POA: Diagnosis not present

## 2020-08-29 NOTE — Assessment & Plan Note (Signed)
BP elevated and she will discuss with her PCP

## 2020-08-29 NOTE — Telephone Encounter (Signed)
KeySilvestre Mesi - PA Case ID: [id number] - Rx #[id number]  Drug Toujeo SoloStar 300UNIT/ML pen-injectors Form [employer] Electronic PA Form [phone] NCPDP) Original Claim Info [phone] NON-FORMULARY DRUG, CONTACT PRESCRIBERTRY BASAGLAR, TRESIBA OR LEVEMIR(PHARMACY HELP DESK [phone])[id number]:  Sent to Plan today

## 2020-08-29 NOTE — Progress Notes (Signed)
   Subjective:    Patient ID: Christy Zhang, female    DOB: November 15, 1955, 65 y.o.   MRN: [id number]  HPI Here for hsfu She was hospitalized last month with right acute limb ischemia with gangrene requring TMA done by Dr Donzetta Matters on 08/11/20.  Following amputation, operative findings found good bone but otherwise some necrotic fat along the plantar plane to the calcaneus and the wound was packed open.  She had a wound VAC placed and antibiotics were discontinued at discharge after appropriate post surgical treatment.  She is here today to recheck clinically.  She continues to have a wound vac on her foot and no issues. She reports it is continuing to heal and closing.  No pus noted per report though some necrotic looking tissue.  She follows up with Dr. Donzetta Matters on 09/05/20.  No fever, no chills.     Review of Systems  Constitutional: Negative for chills and fever.  Gastrointestinal: Negative for diarrhea and nausea.  Skin: Negative for rash.       Objective:   Physical Exam Eyes:     General: No scleral icterus. Musculoskeletal:     Comments: Vac in place  Skin:    Findings: No rash.  Neurological:     Mental Status: She is alert.           Assessment & Plan:

## 2020-08-29 NOTE — Assessment & Plan Note (Signed)
S/p source control and clinically seems to be healing well so no indication for antibiotic treatment at this time.  Will continue to observe off of antibiotics.

## 2020-08-30 DIAGNOSIS — Z4781 Encounter for orthopedic aftercare following surgical amputation: Secondary | ICD-10-CM | POA: Diagnosis not present

## 2020-08-30 DIAGNOSIS — N2581 Secondary hyperparathyroidism of renal origin: Secondary | ICD-10-CM | POA: Diagnosis not present

## 2020-08-30 DIAGNOSIS — I70261 Atherosclerosis of native arteries of extremities with gangrene, right leg: Secondary | ICD-10-CM | POA: Diagnosis not present

## 2020-08-30 DIAGNOSIS — Z4801 Encounter for change or removal of surgical wound dressing: Secondary | ICD-10-CM | POA: Diagnosis not present

## 2020-08-30 DIAGNOSIS — Z992 Dependence on renal dialysis: Secondary | ICD-10-CM | POA: Diagnosis not present

## 2020-08-30 DIAGNOSIS — E1169 Type 2 diabetes mellitus with other specified complication: Secondary | ICD-10-CM | POA: Diagnosis not present

## 2020-08-30 DIAGNOSIS — D631 Anemia in chronic kidney disease: Secondary | ICD-10-CM | POA: Diagnosis not present

## 2020-08-30 DIAGNOSIS — E1152 Type 2 diabetes mellitus with diabetic peripheral angiopathy with gangrene: Secondary | ICD-10-CM | POA: Diagnosis not present

## 2020-08-30 DIAGNOSIS — N186 End stage renal disease: Secondary | ICD-10-CM | POA: Diagnosis not present

## 2020-08-30 DIAGNOSIS — M869 Osteomyelitis, unspecified: Secondary | ICD-10-CM | POA: Diagnosis not present

## 2020-08-30 NOTE — Telephone Encounter (Signed)
Your request was denied We have denied coverage or payment under your Medicare Part D benefit for the following prescription drug(s)  that you or your prescriber requested: TOUJEO SOLOSTAR Soln Pen-inj Why did we deny your request? We denied this request under Medicare Part D because: The requested drug is not on your plan's formulary  (list of covered drugs). Your Medicare Part D drug plan was asked to cover a drug that is not on the formulary  (this is called a formulary exception). Your prescriber did not provide the detailed information that is required in  order to approve the request. To receive a formulary exception, your prescriber must provide information that  documents at least one of the following has occurred:  - You have tried the formulary drugs for the treatment of your condition and they did not work for you. OR - The formulary drugs could cause adverse effects. OR - The formulary drugs would be less effective for your condition than the requested drug.  Talk to your prescriber to see if the following covered alternative(s) would be right for you: Levemir FlexTouch  and [employer]

## 2020-08-31 ENCOUNTER — Other Ambulatory Visit: Payer: Self-pay | Admitting: Family Medicine

## 2020-08-31 ENCOUNTER — Telehealth: Payer: Self-pay

## 2020-08-31 DIAGNOSIS — Z4801 Encounter for change or removal of surgical wound dressing: Secondary | ICD-10-CM | POA: Diagnosis not present

## 2020-08-31 DIAGNOSIS — I70261 Atherosclerosis of native arteries of extremities with gangrene, right leg: Secondary | ICD-10-CM | POA: Diagnosis not present

## 2020-08-31 DIAGNOSIS — M869 Osteomyelitis, unspecified: Secondary | ICD-10-CM | POA: Diagnosis not present

## 2020-08-31 DIAGNOSIS — E1169 Type 2 diabetes mellitus with other specified complication: Secondary | ICD-10-CM | POA: Diagnosis not present

## 2020-08-31 DIAGNOSIS — E1152 Type 2 diabetes mellitus with diabetic peripheral angiopathy with gangrene: Secondary | ICD-10-CM | POA: Diagnosis not present

## 2020-08-31 DIAGNOSIS — Z4781 Encounter for orthopedic aftercare following surgical amputation: Secondary | ICD-10-CM | POA: Diagnosis not present

## 2020-08-31 MED ORDER — "INSULIN SYRINGE-NEEDLE U-100 28G X 1/2"" 1 ML MISC": 3 refills | Status: AC

## 2020-08-31 MED ORDER — INSULIN DETEMIR 100 UNIT/ML ~~LOC~~ SOLN: 16.0000 [IU] | Freq: Every day | SUBCUTANEOUS | 11 refills | Status: AC

## 2020-08-31 NOTE — Telephone Encounter (Signed)
  Prescription Request  08/31/2020  What is the name of the medication or equipment? Pharmacy called and said she needs syringes  Have you contacted your pharmacy to request a refill? (if applicable) na  Which pharmacy would you like this sent to? [facility]   Patient notified that their request is being sent to the clinical staff for review and that they should receive a response within 2 business days.

## 2020-08-31 NOTE — Telephone Encounter (Signed)
I sent in levemir. The directions are the same. 16 units daily. It is very similar to the [employer]

## 2020-08-31 NOTE — Telephone Encounter (Signed)
Insulin syringes sent to pharmacy

## 2020-08-31 NOTE — Telephone Encounter (Signed)
Called patient, no answer, no option to leave vm

## 2020-09-01 DIAGNOSIS — I70261 Atherosclerosis of native arteries of extremities with gangrene, right leg: Secondary | ICD-10-CM | POA: Diagnosis not present

## 2020-09-01 DIAGNOSIS — Z4801 Encounter for change or removal of surgical wound dressing: Secondary | ICD-10-CM | POA: Diagnosis not present

## 2020-09-01 DIAGNOSIS — N186 End stage renal disease: Secondary | ICD-10-CM | POA: Diagnosis not present

## 2020-09-01 DIAGNOSIS — M869 Osteomyelitis, unspecified: Secondary | ICD-10-CM | POA: Diagnosis not present

## 2020-09-01 DIAGNOSIS — Z992 Dependence on renal dialysis: Secondary | ICD-10-CM | POA: Diagnosis not present

## 2020-09-01 DIAGNOSIS — Z4781 Encounter for orthopedic aftercare following surgical amputation: Secondary | ICD-10-CM | POA: Diagnosis not present

## 2020-09-01 DIAGNOSIS — E1169 Type 2 diabetes mellitus with other specified complication: Secondary | ICD-10-CM | POA: Diagnosis not present

## 2020-09-01 DIAGNOSIS — D631 Anemia in chronic kidney disease: Secondary | ICD-10-CM | POA: Diagnosis not present

## 2020-09-01 DIAGNOSIS — E1152 Type 2 diabetes mellitus with diabetic peripheral angiopathy with gangrene: Secondary | ICD-10-CM | POA: Diagnosis not present

## 2020-09-01 DIAGNOSIS — N2581 Secondary hyperparathyroidism of renal origin: Secondary | ICD-10-CM | POA: Diagnosis not present

## 2020-09-04 DIAGNOSIS — D631 Anemia in chronic kidney disease: Secondary | ICD-10-CM | POA: Diagnosis not present

## 2020-09-04 DIAGNOSIS — I70261 Atherosclerosis of native arteries of extremities with gangrene, right leg: Secondary | ICD-10-CM | POA: Diagnosis not present

## 2020-09-04 DIAGNOSIS — N186 End stage renal disease: Secondary | ICD-10-CM | POA: Diagnosis not present

## 2020-09-04 DIAGNOSIS — E1169 Type 2 diabetes mellitus with other specified complication: Secondary | ICD-10-CM | POA: Diagnosis not present

## 2020-09-04 DIAGNOSIS — M869 Osteomyelitis, unspecified: Secondary | ICD-10-CM | POA: Diagnosis not present

## 2020-09-04 DIAGNOSIS — Z4781 Encounter for orthopedic aftercare following surgical amputation: Secondary | ICD-10-CM | POA: Diagnosis not present

## 2020-09-04 DIAGNOSIS — Z4801 Encounter for change or removal of surgical wound dressing: Secondary | ICD-10-CM | POA: Diagnosis not present

## 2020-09-04 DIAGNOSIS — N2581 Secondary hyperparathyroidism of renal origin: Secondary | ICD-10-CM | POA: Diagnosis not present

## 2020-09-04 DIAGNOSIS — Z992 Dependence on renal dialysis: Secondary | ICD-10-CM | POA: Diagnosis not present

## 2020-09-04 DIAGNOSIS — E1152 Type 2 diabetes mellitus with diabetic peripheral angiopathy with gangrene: Secondary | ICD-10-CM | POA: Diagnosis not present

## 2020-09-04 NOTE — Telephone Encounter (Signed)
CALLED PATIENT, NO ANSWER, NO OPTION TO LEAVE MESSAGE 

## 2020-09-05 ENCOUNTER — Other Ambulatory Visit: Payer: Self-pay

## 2020-09-05 ENCOUNTER — Ambulatory Visit (INDEPENDENT_AMBULATORY_CARE_PROVIDER_SITE_OTHER): Payer: Medicare Other | Admitting: Physician Assistant

## 2020-09-05 VITALS — BP 157/63 | HR 93 | Temp 97.9°F | Resp 20 | Ht 63.0 in | Wt 215.0 lb

## 2020-09-05 DIAGNOSIS — I739 Peripheral vascular disease, unspecified: Secondary | ICD-10-CM

## 2020-09-05 DIAGNOSIS — T8131XA Disruption of external operation (surgical) wound, not elsewhere classified, initial encounter: Secondary | ICD-10-CM

## 2020-09-05 DIAGNOSIS — I998 Other disorder of circulatory system: Secondary | ICD-10-CM

## 2020-09-05 NOTE — H&P (View-Only) (Signed)
POST OPERATIVE OFFICE NOTE    CC:  F/u for surgery  HPI:  This is a 65 y.o. female who is s/p revision of right TMA with wound vac. on 08/11/20 by Dr. Donzetta Matters.  She is here today for wound check and vac change.  She denise fever and chills.  There has not been reported purulent drainage when the RN has changed the vac at home.      Allergies  Allergen Reactions  . Sulfa Antibiotics Swelling    Current Outpatient Medications  Medication Sig Dispense Refill  . acetaminophen (TYLENOL) 325 MG tablet Take 1-2 tablets (325-650 mg total) by mouth every 4 (four) hours as needed for mild pain.    Marland Kitchen amLODipine (NORVASC) 5 MG tablet Take 1 tablet (5 mg total) by mouth at bedtime. 30 tablet 0  . aspirin EC 81 MG tablet Take 81 mg by mouth at bedtime. Swallow whole.    Marland Kitchen atorvastatin (LIPITOR) 40 MG tablet TAKE ONE TABLET BY MOUTH DAILY 90 tablet 0  . clopidogrel (PLAVIX) 75 MG tablet Take 1 tablet (75 mg total) by mouth daily. (Patient taking differently: Take 75 mg by mouth every evening.) 30 tablet 11  . diphenhydrAMINE (BENADRYL) 25 MG tablet Take 25 mg by mouth every 6 (six) hours as needed for itching.    . gabapentin (NEURONTIN) 300 MG capsule Take 1 capsule (300 mg total) by mouth at bedtime. 30 capsule 1  . insulin detemir (LEVEMIR) 100 UNIT/ML injection Inject 0.16 mLs (16 Units total) into the skin daily. 5 mL 11  . Insulin Syringe-Needle U-100 28G X 1/2" 1 ML MISC Use with insulin daily Dx E11.22 100 each 3  . metoprolol succinate (TOPROL-XL) 50 MG 24 hr tablet Take 1 tablet (50 mg total) by mouth daily. Take with or immediately following a meal. 30 tablet 0  . multivitamin (RENA-VIT) TABS tablet Take 1 tablet by mouth at bedtime.     Marland Kitchen oxyCODONE (OXY IR/ROXICODONE) 5 MG immediate release tablet Take 1 tablet (5 mg total) by mouth every 6 (six) hours as needed for severe pain. 10 tablet 0  . polyethylene glycol (MIRALAX / GLYCOLAX) 17 g packet Take 17 g by mouth daily. 14 each 0  .  senna-docusate (SENOKOT-S) 8.6-50 MG tablet Take 1 tablet by mouth 2 (two) times daily. 60 tablet 0  . sevelamer carbonate (RENVELA) 800 MG tablet Take 1,600-2,400 mg by mouth See admin instructions. Take 2400 mg with each meal and 1600 mg with each snack    . traMADol (ULTRAM) 50 MG tablet Take 1 tablet (50 mg total) by mouth every 12 (twelve) hours as needed for moderate pain. 30 tablet 1  . TRULICITY 1.5 0000000 SOPN INJECT CONTENTs OF ONE PEN UNDER THE SKIN WEEKLY ON SATURDAY 2 mL 0   No current facility-administered medications for this visit.     ROS:  See HPI  Physical Exam:        The TMA is not healing and now she has a wound on the anterior ankle.     Assessment/Plan:  This is a 65 y.o. female who is s/p:revision of TMA for limb salvage.   This has failed to heal.  Her daughter is having a baby any day and she wants to be there.  She has accented that she will need a BKA on the right LE.  She will do dry dressing changes to the area and discontinue the wound vac.  She will call us when she is ready  to have her BKA.  She will need rehab after this surgery since she has a BKA on the left already.    Dr. Stanford Breed came into the room and agrees with the plan above.     Roxy Horseman PA-C [facility] [phone]   Clinic MD:  Stanford Breed

## 2020-09-05 NOTE — Progress Notes (Signed)
POST OPERATIVE OFFICE NOTE    CC:  F/u for surgery  HPI:  This is a 65 y.o. female who is s/p revision of right TMA with wound vac. on 08/11/20 by Dr. Donzetta Matters.  She is here today for wound check and vac change.  She denise fever and chills.  There has not been reported purulent drainage when the RN has changed the vac at home.      Allergies  Allergen Reactions  . Sulfa Antibiotics Swelling    Current Outpatient Medications  Medication Sig Dispense Refill  . acetaminophen (TYLENOL) 325 MG tablet Take 1-2 tablets (325-650 mg total) by mouth every 4 (four) hours as needed for mild pain.    Marland Kitchen amLODipine (NORVASC) 5 MG tablet Take 1 tablet (5 mg total) by mouth at bedtime. 30 tablet 0  . aspirin EC 81 MG tablet Take 81 mg by mouth at bedtime. Swallow whole.    Marland Kitchen atorvastatin (LIPITOR) 40 MG tablet TAKE ONE TABLET BY MOUTH DAILY 90 tablet 0  . clopidogrel (PLAVIX) 75 MG tablet Take 1 tablet (75 mg total) by mouth daily. (Patient taking differently: Take 75 mg by mouth every evening.) 30 tablet 11  . diphenhydrAMINE (BENADRYL) 25 MG tablet Take 25 mg by mouth every 6 (six) hours as needed for itching.    . gabapentin (NEURONTIN) 300 MG capsule Take 1 capsule (300 mg total) by mouth at bedtime. 30 capsule 1  . insulin detemir (LEVEMIR) 100 UNIT/ML injection Inject 0.16 mLs (16 Units total) into the skin daily. 5 mL 11  . Insulin Syringe-Needle U-100 28G X 1/2" 1 ML MISC Use with insulin daily Dx E11.22 100 each 3  . metoprolol succinate (TOPROL-XL) 50 MG 24 hr tablet Take 1 tablet (50 mg total) by mouth daily. Take with or immediately following a meal. 30 tablet 0  . multivitamin (RENA-VIT) TABS tablet Take 1 tablet by mouth at bedtime.     Marland Kitchen oxyCODONE (OXY IR/ROXICODONE) 5 MG immediate release tablet Take 1 tablet (5 mg total) by mouth every 6 (six) hours as needed for severe pain. 10 tablet 0  . polyethylene glycol (MIRALAX / GLYCOLAX) 17 g packet Take 17 g by mouth daily. 14 each 0  .  senna-docusate (SENOKOT-S) 8.6-50 MG tablet Take 1 tablet by mouth 2 (two) times daily. 60 tablet 0  . sevelamer carbonate (RENVELA) 800 MG tablet Take 1,600-2,400 mg by mouth See admin instructions. Take 2400 mg with each meal and 1600 mg with each snack    . traMADol (ULTRAM) 50 MG tablet Take 1 tablet (50 mg total) by mouth every 12 (twelve) hours as needed for moderate pain. 30 tablet 1  . TRULICITY 1.5 0000000 SOPN INJECT CONTENTs OF ONE PEN UNDER THE SKIN WEEKLY ON SATURDAY 2 mL 0   No current facility-administered medications for this visit.     ROS:  See HPI  Physical Exam:        The TMA is not healing and now she has a wound on the anterior ankle.     Assessment/Plan:  This is a 65 y.o. female who is s/p:revision of TMA for limb salvage.   This has failed to heal.  Her daughter is having a baby any day and she wants to be there.  She has accented that she will need a BKA on the right LE.  She will do dry dressing changes to the area and discontinue the wound vac.  She will call us when she is ready  to have her BKA.  She will need rehab after this surgery since she has a BKA on the left already.    Dr. Stanford Breed came into the room and agrees with the plan above.     Roxy Horseman PA-C [facility] [phone]   Clinic MD:  Stanford Breed

## 2020-09-06 ENCOUNTER — Telehealth: Payer: Medicare Other

## 2020-09-06 ENCOUNTER — Other Ambulatory Visit: Payer: Self-pay | Admitting: Family Medicine

## 2020-09-06 DIAGNOSIS — N2581 Secondary hyperparathyroidism of renal origin: Secondary | ICD-10-CM | POA: Diagnosis not present

## 2020-09-06 DIAGNOSIS — Z4801 Encounter for change or removal of surgical wound dressing: Secondary | ICD-10-CM | POA: Diagnosis not present

## 2020-09-06 DIAGNOSIS — I70261 Atherosclerosis of native arteries of extremities with gangrene, right leg: Secondary | ICD-10-CM | POA: Diagnosis not present

## 2020-09-06 DIAGNOSIS — Z4781 Encounter for orthopedic aftercare following surgical amputation: Secondary | ICD-10-CM | POA: Diagnosis not present

## 2020-09-06 DIAGNOSIS — E1152 Type 2 diabetes mellitus with diabetic peripheral angiopathy with gangrene: Secondary | ICD-10-CM | POA: Diagnosis not present

## 2020-09-06 DIAGNOSIS — Z992 Dependence on renal dialysis: Secondary | ICD-10-CM | POA: Diagnosis not present

## 2020-09-06 DIAGNOSIS — N186 End stage renal disease: Secondary | ICD-10-CM | POA: Diagnosis not present

## 2020-09-06 DIAGNOSIS — M869 Osteomyelitis, unspecified: Secondary | ICD-10-CM | POA: Diagnosis not present

## 2020-09-06 DIAGNOSIS — E1169 Type 2 diabetes mellitus with other specified complication: Secondary | ICD-10-CM | POA: Diagnosis not present

## 2020-09-06 DIAGNOSIS — D631 Anemia in chronic kidney disease: Secondary | ICD-10-CM | POA: Diagnosis not present

## 2020-09-07 ENCOUNTER — Ambulatory Visit (INDEPENDENT_AMBULATORY_CARE_PROVIDER_SITE_OTHER): Payer: Medicare Other | Admitting: *Deleted

## 2020-09-07 DIAGNOSIS — E1122 Type 2 diabetes mellitus with diabetic chronic kidney disease: Secondary | ICD-10-CM

## 2020-09-07 DIAGNOSIS — I151 Hypertension secondary to other renal disorders: Secondary | ICD-10-CM

## 2020-09-07 DIAGNOSIS — IMO0002 Reserved for concepts with insufficient information to code with codable children: Secondary | ICD-10-CM

## 2020-09-07 DIAGNOSIS — N2889 Other specified disorders of kidney and ureter: Secondary | ICD-10-CM

## 2020-09-08 ENCOUNTER — Encounter: Payer: Self-pay | Admitting: Vascular Surgery

## 2020-09-08 ENCOUNTER — Other Ambulatory Visit: Payer: Self-pay | Admitting: *Deleted

## 2020-09-08 ENCOUNTER — Other Ambulatory Visit: Payer: Self-pay

## 2020-09-08 ENCOUNTER — Ambulatory Visit (INDEPENDENT_AMBULATORY_CARE_PROVIDER_SITE_OTHER): Payer: Medicare Other | Admitting: Vascular Surgery

## 2020-09-08 DIAGNOSIS — T8189XD Other complications of procedures, not elsewhere classified, subsequent encounter: Secondary | ICD-10-CM

## 2020-09-08 DIAGNOSIS — Z992 Dependence on renal dialysis: Secondary | ICD-10-CM | POA: Diagnosis not present

## 2020-09-08 DIAGNOSIS — N2581 Secondary hyperparathyroidism of renal origin: Secondary | ICD-10-CM | POA: Diagnosis not present

## 2020-09-08 DIAGNOSIS — I739 Peripheral vascular disease, unspecified: Secondary | ICD-10-CM

## 2020-09-08 DIAGNOSIS — D631 Anemia in chronic kidney disease: Secondary | ICD-10-CM | POA: Diagnosis not present

## 2020-09-08 DIAGNOSIS — N186 End stage renal disease: Secondary | ICD-10-CM | POA: Diagnosis not present

## 2020-09-08 NOTE — Progress Notes (Signed)
    Attempted to call the patient multiple times.  I was ultimately able to contact the patient's daughter Webb Silversmith.  I have confirmed that we will plan for right lower extremity below-knee amputation.  The patient was previously agreeable to this in our office.  She will be scheduled on a nondialysis day in the near future.  Elivia Robotham C. Donzetta Matters, MD [facility] Office: [phone] Pager: [phone]

## 2020-09-11 ENCOUNTER — Telehealth: Payer: Self-pay

## 2020-09-11 DIAGNOSIS — M869 Osteomyelitis, unspecified: Secondary | ICD-10-CM | POA: Diagnosis not present

## 2020-09-11 DIAGNOSIS — Z4781 Encounter for orthopedic aftercare following surgical amputation: Secondary | ICD-10-CM | POA: Diagnosis not present

## 2020-09-11 DIAGNOSIS — Z4801 Encounter for change or removal of surgical wound dressing: Secondary | ICD-10-CM | POA: Diagnosis not present

## 2020-09-11 DIAGNOSIS — I70261 Atherosclerosis of native arteries of extremities with gangrene, right leg: Secondary | ICD-10-CM | POA: Diagnosis not present

## 2020-09-11 DIAGNOSIS — Z992 Dependence on renal dialysis: Secondary | ICD-10-CM | POA: Diagnosis not present

## 2020-09-11 DIAGNOSIS — N186 End stage renal disease: Secondary | ICD-10-CM | POA: Diagnosis not present

## 2020-09-11 DIAGNOSIS — E1152 Type 2 diabetes mellitus with diabetic peripheral angiopathy with gangrene: Secondary | ICD-10-CM | POA: Diagnosis not present

## 2020-09-11 DIAGNOSIS — D631 Anemia in chronic kidney disease: Secondary | ICD-10-CM | POA: Diagnosis not present

## 2020-09-11 DIAGNOSIS — E1169 Type 2 diabetes mellitus with other specified complication: Secondary | ICD-10-CM | POA: Diagnosis not present

## 2020-09-11 DIAGNOSIS — N2581 Secondary hyperparathyroidism of renal origin: Secondary | ICD-10-CM | POA: Diagnosis not present

## 2020-09-11 NOTE — Chronic Care Management (AMB) (Signed)
  Care Management   Note  09/11/2020 Name: Christy Zhang MRN: [id number] DOB: 09/20/1955  Christy Zhang is a 65 y.o. year old female who is a primary care patient of [name], [name], MD and is actively engaged with the care management team. I reached out to Christy Zhang by phone today to assist with re-scheduling a follow up visit with the RN Case Manager  Follow up plan: Telephone appointment with care management team member scheduled for:10/03/2020  Noreene Larsson, [address], [place], [place] [address] Direct Dial: [phone] [email or web address] Website: [email or web address]

## 2020-09-12 DIAGNOSIS — Z4801 Encounter for change or removal of surgical wound dressing: Secondary | ICD-10-CM | POA: Diagnosis not present

## 2020-09-12 DIAGNOSIS — M869 Osteomyelitis, unspecified: Secondary | ICD-10-CM | POA: Diagnosis not present

## 2020-09-12 DIAGNOSIS — E1169 Type 2 diabetes mellitus with other specified complication: Secondary | ICD-10-CM | POA: Diagnosis not present

## 2020-09-12 DIAGNOSIS — I70261 Atherosclerosis of native arteries of extremities with gangrene, right leg: Secondary | ICD-10-CM | POA: Diagnosis not present

## 2020-09-12 DIAGNOSIS — Z4781 Encounter for orthopedic aftercare following surgical amputation: Secondary | ICD-10-CM | POA: Diagnosis not present

## 2020-09-12 DIAGNOSIS — E1152 Type 2 diabetes mellitus with diabetic peripheral angiopathy with gangrene: Secondary | ICD-10-CM | POA: Diagnosis not present

## 2020-09-12 NOTE — Telephone Encounter (Signed)
After multiple attempts to reach patient at numbers listed in chart, encounter closed.

## 2020-09-13 ENCOUNTER — Other Ambulatory Visit (HOSPITAL_COMMUNITY)
Admission: RE | Admit: 2020-09-13 | Discharge: 2020-09-13 | Disposition: A | Discharge status: Home / Self Care | Payer: Medicare Other | Source: Ambulatory Visit | Attending: Vascular Surgery | Admitting: Vascular Surgery

## 2020-09-13 ENCOUNTER — Telehealth: Payer: Medicare Other

## 2020-09-13 ENCOUNTER — Other Ambulatory Visit (HOSPITAL_COMMUNITY): Payer: Medicare Other

## 2020-09-13 ENCOUNTER — Other Ambulatory Visit: Payer: Self-pay

## 2020-09-13 DIAGNOSIS — Z01812 Encounter for preprocedural laboratory examination: Secondary | ICD-10-CM | POA: Insufficient documentation

## 2020-09-13 DIAGNOSIS — N186 End stage renal disease: Secondary | ICD-10-CM | POA: Diagnosis not present

## 2020-09-13 DIAGNOSIS — M869 Osteomyelitis, unspecified: Secondary | ICD-10-CM | POA: Diagnosis not present

## 2020-09-13 DIAGNOSIS — Z20822 Contact with and (suspected) exposure to covid-19: Secondary | ICD-10-CM | POA: Insufficient documentation

## 2020-09-13 DIAGNOSIS — E1169 Type 2 diabetes mellitus with other specified complication: Secondary | ICD-10-CM | POA: Diagnosis not present

## 2020-09-13 DIAGNOSIS — E1152 Type 2 diabetes mellitus with diabetic peripheral angiopathy with gangrene: Secondary | ICD-10-CM | POA: Diagnosis not present

## 2020-09-13 DIAGNOSIS — N2581 Secondary hyperparathyroidism of renal origin: Secondary | ICD-10-CM | POA: Diagnosis not present

## 2020-09-13 DIAGNOSIS — Z992 Dependence on renal dialysis: Secondary | ICD-10-CM | POA: Diagnosis not present

## 2020-09-13 DIAGNOSIS — I70261 Atherosclerosis of native arteries of extremities with gangrene, right leg: Secondary | ICD-10-CM | POA: Diagnosis not present

## 2020-09-13 DIAGNOSIS — D631 Anemia in chronic kidney disease: Secondary | ICD-10-CM | POA: Diagnosis not present

## 2020-09-13 DIAGNOSIS — Z4801 Encounter for change or removal of surgical wound dressing: Secondary | ICD-10-CM | POA: Diagnosis not present

## 2020-09-13 DIAGNOSIS — Z4781 Encounter for orthopedic aftercare following surgical amputation: Secondary | ICD-10-CM | POA: Diagnosis not present

## 2020-09-13 LAB — SARS CORONAVIRUS 2 (TAT 6-24 HRS): SARS Coronavirus 2: NEGATIVE

## 2020-09-13 NOTE — Anesthesia Preprocedure Evaluation (Addendum)
Anesthesia Evaluation  Patient identified by MRN, date of birth, ID band Patient awake    Reviewed: Allergy & Precautions, NPO status , Patient's Chart, lab work & pertinent test results  Airway Mallampati: II  TM Distance: >3 FB Neck ROM: Full    Dental  (+) Dental Advisory Given   Pulmonary asthma , former smoker,    breath sounds clear to auscultation       Cardiovascular hypertension, Pt. on medications and Pt. on home beta blockers + Peripheral Vascular Disease and +CHF   Rhythm:Regular Rate:Normal     Neuro/Psych  Neuromuscular disease    GI/Hepatic negative GI ROS, Neg liver ROS,   Endo/Other  diabetes, Type 2, Insulin Dependent  Renal/GU ESRF and DialysisRenal disease     Musculoskeletal  (+) Arthritis ,   Abdominal   Peds  Hematology  (+) anemia ,   Anesthesia Other Findings   Reproductive/Obstetrics                            Anesthesia Physical Anesthesia Plan  ASA: III  Anesthesia Plan: General   Post-op Pain Management:  Regional for Post-op pain   Induction: Intravenous  PONV Risk Score and Plan: 3 and Dexamethasone, Ondansetron, Midazolam and Treatment may vary due to age or medical condition  Airway Management Planned: LMA  Additional Equipment: None  Intra-op Plan:   Post-operative Plan: Extubation in OR  Informed Consent: I have reviewed the patients History and Physical, chart, labs and discussed the procedure including the risks, benefits and alternatives for the proposed anesthesia with the patient or authorized representative who has indicated his/her understanding and acceptance.     Dental advisory given  Plan Discussed with: CRNA  Anesthesia Plan Comments: ( )       Anesthesia Quick Evaluation

## 2020-09-13 NOTE — Progress Notes (Signed)
PCP - Dr Claretta Fraise Cardiologist - n/a  Chest x-ray - 06/22/20 EKG - 08/08/20 Stress Test - n/a ECHO - 05/27/14 [facility] Cardiac Cath - n/a  Fasting Blood Sugar - unknown  Checks Blood Sugar occasional checks  . THE MORNING OF SURGERY, take 8 units of Levemir Insulin.  . If your blood sugar is less than 70 mg/dL, you will need to treat for low blood sugar: o Treat a low blood sugar (less than 70 mg/dL) with  cup of clear juice (cranberry or apple), 4 glucose tablets, OR glucose gel. o Recheck blood sugar in 15 minutes after treatment (to make sure it is greater than 70 mg/dL). If your blood sugar is not greater than 70 mg/dL on recheck, call [phone] for further instructions.  Anesthesia review: Yes  STOP now taking Aleve, Naproxen, Ibuprofen, Motrin, Advil, Goody's, BC's, all herbal medications, fish oil, and all vitamins.   Continue ASA.    Hold Plavix 5 days prior to procedure.  Last dose was on 09/10/20.  Coronavirus Screening Covid test is scheduled on 09/13/20 Do you have any of the following symptoms:  Cough yes/no: No Fever (>100.57F)  yes/no: No Runny nose yes/no: No Sore throat yes/no: No Difficulty breathing/shortness of breath  yes/no: No  Have you traveled in the last 14 days and where? yes/no: No  Patient verbalized understanding of instructions that were given via phone.

## 2020-09-13 NOTE — Progress Notes (Signed)
Anesthesia Chart Review: Christy Zhang   Case: [id number] Date/Time: 09/14/20 0855   Procedure: AMPUTATION BELOW KNEE RIGHT (Right Knee)   Anesthesia type: General   Pre-op diagnosis: PAD   Location: [facility] OR ROOM 16 / [facility]   Surgeons: Waynetta Sandy, MD      DISCUSSION: Patient is a 65 year old female scheduled for the above procedure. She is s/p left BKA 06/08/20 and right transmetatarsal amputation 08/11/20 and angioplasty right AT artery 08/10/20. Unfortunately her right TMA site has failed to heal and right BKA recommended. She is a dialysis patient.   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 + COVID-19 test 05/02/20.   Per [facility], continue ASA and hold Plavix 5 days prior. Anesthesia team to evaluate on the day of surgery.  Anesthesia team to evaluate on the day of surgery.     VS:  BP Readings from Last 3 Encounters:  09/05/20 (!) 157/63  08/29/20 (!) 168/78  08/25/20 (!) 151/71   Pulse Readings from Last 3 Encounters:  09/05/20 93  08/29/20 (!) 102  08/25/20 (!) 101    PROVIDERS: Claretta Fraise, MD is PCP    LABS: For day of surgery. Last results include: Lab Results  Component Value Date   WBC 15.8 (H) 08/18/2020   HGB 7.2 (L) 08/18/2020   HCT 23.5 (L) 08/18/2020   PLT 524 (H) 08/18/2020   GLUCOSE 149 (H) 08/17/2020   CHOL 92 (L) 07/20/2020   TRIG 69 07/20/2020   HDL 42 07/20/2020   LDLCALC 35 07/20/2020   ALT 7 08/08/2020   AST 10 (L) 08/08/2020   NA 131 (L) 08/17/2020   K 4.6 08/17/2020   CL 93 (L) 08/17/2020   CREATININE 7.07 (H)  08/17/2020   BUN 37 (H) 08/17/2020   CO2 28 08/17/2020   INR 1.2 08/10/2020   HGBA1C 6.5 (H) 08/09/2020     IMAGES: CXR 06/22/20: FINDINGS: Vascular graft noted in the right upper chest. Heart size is enlarged. The lungs are clear. No pneumothorax or pleural effusion. Degenerative changes are noted in the thoracic spine. IMPRESSION: Cardiomegaly.  No evidence of active disease.   EKG: 08/08/20: Sinus rhythm Right axis deviation No significant change since last tracing Confirmed by Wandra Arthurs [phone]) on 08/08/2020 9:56:57 PM   CV: She had echo ~ 05/27/14 during admission to [facility] for nephrotic syndrome, volume overload, acute respiratory failure requiring intubation and initiation of hemodialysis.    Past Medical History:  Diagnosis Date  . Anemia   . Arthritis   . Asthma    in the past  . CHF (congestive heart failure) ([facility])   . Chronic kidney disease    Renal failure, dialysis on M/W/F  . 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 End stage renal disease ([place])   . End stage renal disease on dialysis ([place])   . Gangrene ([place])   . Hemodialysis access, arteriovenous graft ([place])   . Hyperglycemia due to diabetes mellitus ([facility]) 10/15/2019  . Hyperlipidemia   . Hypertension   . Neuromuscular disorder ([place])     Past  Surgical History:  Procedure Laterality Date  .  LEFT BELOW KNEE AMPUTATION (Left Knee)  06/08/2020  . A/V FISTULAGRAM Right 02/04/2017   Procedure: A/V Fistulagram;  Surgeon: Waynetta Sandy, MD;  Location: [facility];  Service: Cardiovascular;  Laterality: Right;  . ABDOMINAL AORTOGRAM W/LOWER EXTREMITY N/A 11/18/2019   Procedure: ABDOMINAL AORTOGRAM W/LOWER EXTREMITY;  Surgeon: [name], MD;  Location: [facility];  Service: Cardiovascular;  Laterality: N/A;  . ABDOMINAL AORTOGRAM W/LOWER EXTREMITY N/A 02/23/2020   Procedure: ABDOMINAL AORTOGRAM W/LOWER EXTREMITY;  Surgeon:  [name], MD;  Location: [facility];  Service: Cardiovascular;  Laterality: N/A;  Bilateral  . ABDOMINAL AORTOGRAM W/LOWER EXTREMITY N/A 08/10/2020   Procedure: ABDOMINAL AORTOGRAM W/LOWER EXTREMITY;  Surgeon: [name], MD;  Location: [facility];  Service: Cardiovascular;  Laterality: N/A;  . AMPUTATION Left 06/08/2020   Procedure: LEFT BELOW KNEE AMPUTATION;  Surgeon: Cherre Robins, MD;  Location: [facility];  Service: Vascular;  Laterality: Left;  . AV FISTULA PLACEMENT Right 08/25/2014   Procedure: RIGHT BRACHIOCEPHALIC ARTERIOVENOUS (AV) FISTULA CREATION;  Surgeon: Mal Misty, MD;  Location: [facility];  Service: Vascular;  Laterality: Right;  . AV FISTULA PLACEMENT Right 03/18/2019   Procedure: INSERTION OF ARTERIOVENOUS (AV) GORE-TEX GRAFT RIGHT ARM;  Surgeon: Serafina Mitchell, MD;  Location: [facility];  Service: Vascular;  Laterality: Right;  . BASCILIC VEIN TRANSPOSITION Right 12/24/2018   Procedure: FIRST STAGE BASILIC VEIN TRANSPOSITION RIGHT UPPER ARM;  Surgeon: Serafina Mitchell, MD;  Location: [facility];  Service: Vascular;  Laterality: Right;  . FISTULA SUPERFICIALIZATION Right 11/03/2014   Procedure: RIGHT UPPER ARM FISTULA SUPERFICIALIZATION;  Surgeon: Mal Misty, MD;  Location: [facility];  Service: Vascular;  Laterality: Right;  . FISTULOGRAM Right 01/16/2016   Procedure: RIGHT UPPER EXTREMITY ARTERIOVENOUS FISTULOGRAM WITH BALLOON ANGIOPLASTY;  Surgeon: Vickie Epley, MD;  Location: [facility];  Service: Vascular;  Laterality: Right;  . INSERTION OF DIALYSIS CATHETER     X4  . IR FLUORO GUIDE CV LINE RIGHT  12/03/2018  . IR REMOVAL TUN CV CATH W/O FL  07/06/2019  . IR THROMBECTOMY AV FISTULA W/THROMBOLYSIS/PTA INC/SHUNT/IMG RIGHT Right 12/03/2018  . IR THROMBECTOMY AV FISTULA W/THROMBOLYSIS/PTA/STENT INC/SHUNT/IMG RT Right 10/11/2019  . IR US GUIDE VASC ACCESS RIGHT  12/03/2018  . IR US GUIDE VASC ACCESS RIGHT  12/03/2018  . IR US GUIDE VASC ACCESS RIGHT  10/14/2019   . LIGATION OF COMPETING BRANCHES OF ARTERIOVENOUS FISTULA Right 11/03/2014   Procedure: LIGATION OF COMPETING BRANCHE x1 OF RIGHT UPPER ARM ARTERIOVENOUS FISTULA;  Surgeon: Mal Misty, MD;  Location: [facility];  Service: Vascular;  Laterality: Right;  . PERIPHERAL VASCULAR BALLOON ANGIOPLASTY Right 02/04/2017   Procedure: PERIPHERAL VASCULAR BALLOON ANGIOPLASTY;  Surgeon: Waynetta Sandy, MD;  Location: [facility];  Service: Cardiovascular;  Laterality: Right;  . PERIPHERAL VASCULAR BALLOON ANGIOPLASTY Right 11/18/2019   Procedure: PERIPHERAL VASCULAR BALLOON ANGIOPLASTY;  Surgeon: [name], MD;  Location: [facility];  Service: Cardiovascular;  Laterality: Right;  anterior tibial  . PERIPHERAL VASCULAR BALLOON ANGIOPLASTY  02/23/2020   Procedure: PERIPHERAL VASCULAR BALLOON ANGIOPLASTY;  Surgeon: [name], MD;  Location: [facility];  Service: Cardiovascular;;  Lt  AT  . PERIPHERAL VASCULAR INTERVENTION Right 08/10/2020   Procedure: PERIPHERAL VASCULAR INTERVENTION;  Surgeon: [name], MD;  Location: [facility];  Service: Cardiovascular;  Laterality: Right;  .  TRANSMETATARSAL AMPUTATION Left 02/24/2020   Procedure: TRANSMETATARSAL AMPUTATION LEFT;  Surgeon: Cherre Robins, MD;  Location: [facility];  Service: Vascular;  Laterality: Left;  . TRANSMETATARSAL AMPUTATION Right 08/11/2020   Procedure: RIGHT TRANSMETATARSAL AMPUTATION;  Surgeon: Waynetta Sandy, MD;  Location: [facility];  Service: Vascular;  Laterality: Right;  . TUBAL LIGATION      MEDICATIONS: No current facility-administered medications for this encounter.   Marland Kitchen acetaminophen (TYLENOL) 325 MG tablet  . amLODipine (NORVASC) 5 MG tablet  . aspirin EC 81 MG tablet  . atorvastatin (LIPITOR) 40 MG tablet  . clopidogrel (PLAVIX) 75 MG tablet  . diphenhydrAMINE (BENADRYL) 25 MG tablet  . gabapentin (NEURONTIN) 300 MG capsule  . insulin detemir (LEVEMIR) 100  UNIT/ML injection  . Insulin Syringe-Needle U-100 28G X 1/2" 1 ML MISC  . metoprolol succinate (TOPROL-XL) 50 MG 24 hr tablet  . multivitamin (RENA-VIT) TABS tablet  . oxyCODONE (OXY IR/ROXICODONE) 5 MG immediate release tablet  . polyethylene glycol (MIRALAX / GLYCOLAX) 17 g packet  . senna-docusate (SENOKOT-S) 8.6-50 MG tablet  . sevelamer carbonate (RENVELA) 800 MG tablet  . traMADol (ULTRAM) 50 MG tablet  . TRULICITY 1.5 0000000 SOPN    Myra Gianotti, PA-C Surgical Short Stay/Anesthesiology [facility] Phone [phone] [facility] Phone [phone] 09/13/2020 12:22 PM

## 2020-09-14 ENCOUNTER — Inpatient Hospital Stay (HOSPITAL_COMMUNITY): Payer: Medicare Other | Admitting: Physician Assistant

## 2020-09-14 ENCOUNTER — Encounter (HOSPITAL_COMMUNITY): Payer: Self-pay | Admitting: Vascular Surgery

## 2020-09-14 ENCOUNTER — Inpatient Hospital Stay (HOSPITAL_COMMUNITY)
Admission: RE | Admit: 2020-09-14 | Discharge: 2020-09-18 | DRG: 474 | Disposition: A | Discharge status: Home Health | Payer: Medicare Other | Attending: Internal Medicine | Admitting: Internal Medicine

## 2020-09-14 ENCOUNTER — Encounter (HOSPITAL_COMMUNITY)
Admission: RE | Disposition: A | Discharge status: Home Health | Payer: Self-pay | Source: Home / Self Care | Attending: Internal Medicine

## 2020-09-14 DIAGNOSIS — D638 Anemia in other chronic diseases classified elsewhere: Secondary | ICD-10-CM | POA: Diagnosis present

## 2020-09-14 DIAGNOSIS — Z8616 Personal history of COVID-19: Secondary | ICD-10-CM

## 2020-09-14 DIAGNOSIS — Z8249 Family history of ischemic heart disease and other diseases of the circulatory system: Secondary | ICD-10-CM | POA: Diagnosis not present

## 2020-09-14 DIAGNOSIS — Z87891 Personal history of nicotine dependence: Secondary | ICD-10-CM

## 2020-09-14 DIAGNOSIS — T8789 Other complications of amputation stump: Principal | ICD-10-CM | POA: Diagnosis present

## 2020-09-14 DIAGNOSIS — E1152 Type 2 diabetes mellitus with diabetic peripheral angiopathy with gangrene: Secondary | ICD-10-CM | POA: Diagnosis not present

## 2020-09-14 DIAGNOSIS — Z4781 Encounter for orthopedic aftercare following surgical amputation: Secondary | ICD-10-CM | POA: Diagnosis not present

## 2020-09-14 DIAGNOSIS — E1169 Type 2 diabetes mellitus with other specified complication: Secondary | ICD-10-CM | POA: Diagnosis not present

## 2020-09-14 DIAGNOSIS — E1122 Type 2 diabetes mellitus with diabetic chronic kidney disease: Secondary | ICD-10-CM | POA: Diagnosis present

## 2020-09-14 DIAGNOSIS — Z7902 Long term (current) use of antithrombotics/antiplatelets: Secondary | ICD-10-CM

## 2020-09-14 DIAGNOSIS — Z6839 Body mass index (BMI) 39.0-39.9, adult: Secondary | ICD-10-CM | POA: Diagnosis not present

## 2020-09-14 DIAGNOSIS — Z992 Dependence on renal dialysis: Secondary | ICD-10-CM

## 2020-09-14 DIAGNOSIS — Z833 Family history of diabetes mellitus: Secondary | ICD-10-CM

## 2020-09-14 DIAGNOSIS — I110 Hypertensive heart disease with heart failure: Secondary | ICD-10-CM | POA: Diagnosis not present

## 2020-09-14 DIAGNOSIS — Z20822 Contact with and (suspected) exposure to covid-19: Secondary | ICD-10-CM | POA: Diagnosis present

## 2020-09-14 DIAGNOSIS — Z882 Allergy status to sulfonamides status: Secondary | ICD-10-CM | POA: Diagnosis not present

## 2020-09-14 DIAGNOSIS — E785 Hyperlipidemia, unspecified: Secondary | ICD-10-CM | POA: Diagnosis present

## 2020-09-14 DIAGNOSIS — E118 Type 2 diabetes mellitus with unspecified complications: Secondary | ICD-10-CM | POA: Diagnosis not present

## 2020-09-14 DIAGNOSIS — Z794 Long term (current) use of insulin: Secondary | ICD-10-CM | POA: Diagnosis not present

## 2020-09-14 DIAGNOSIS — S88112A Complete traumatic amputation at level between knee and ankle, left lower leg, initial encounter: Secondary | ICD-10-CM | POA: Diagnosis not present

## 2020-09-14 DIAGNOSIS — E1151 Type 2 diabetes mellitus with diabetic peripheral angiopathy without gangrene: Secondary | ICD-10-CM | POA: Diagnosis present

## 2020-09-14 DIAGNOSIS — G8929 Other chronic pain: Secondary | ICD-10-CM | POA: Diagnosis not present

## 2020-09-14 DIAGNOSIS — I70261 Atherosclerosis of native arteries of extremities with gangrene, right leg: Secondary | ICD-10-CM | POA: Diagnosis not present

## 2020-09-14 DIAGNOSIS — I70201 Unspecified atherosclerosis of native arteries of extremities, right leg: Secondary | ICD-10-CM | POA: Diagnosis not present

## 2020-09-14 DIAGNOSIS — I96 Gangrene, not elsewhere classified: Secondary | ICD-10-CM | POA: Diagnosis not present

## 2020-09-14 DIAGNOSIS — Z89511 Acquired absence of right leg below knee: Secondary | ICD-10-CM | POA: Diagnosis not present

## 2020-09-14 DIAGNOSIS — N2581 Secondary hyperparathyroidism of renal origin: Secondary | ICD-10-CM | POA: Diagnosis present

## 2020-09-14 DIAGNOSIS — G709 Myoneural disorder, unspecified: Secondary | ICD-10-CM | POA: Diagnosis not present

## 2020-09-14 DIAGNOSIS — D75839 Thrombocytosis, unspecified: Secondary | ICD-10-CM | POA: Diagnosis present

## 2020-09-14 DIAGNOSIS — I739 Peripheral vascular disease, unspecified: Secondary | ICD-10-CM

## 2020-09-14 DIAGNOSIS — I132 Hypertensive heart and chronic kidney disease with heart failure and with stage 5 chronic kidney disease, or end stage renal disease: Secondary | ICD-10-CM | POA: Diagnosis present

## 2020-09-14 DIAGNOSIS — Z79891 Long term (current) use of opiate analgesic: Secondary | ICD-10-CM | POA: Diagnosis not present

## 2020-09-14 DIAGNOSIS — N186 End stage renal disease: Secondary | ICD-10-CM

## 2020-09-14 DIAGNOSIS — I120 Hypertensive chronic kidney disease with stage 5 chronic kidney disease or end stage renal disease: Secondary | ICD-10-CM | POA: Diagnosis not present

## 2020-09-14 DIAGNOSIS — Z8619 Personal history of other infectious and parasitic diseases: Secondary | ICD-10-CM | POA: Diagnosis not present

## 2020-09-14 DIAGNOSIS — D631 Anemia in chronic kidney disease: Secondary | ICD-10-CM | POA: Diagnosis present

## 2020-09-14 DIAGNOSIS — E669 Obesity, unspecified: Secondary | ICD-10-CM | POA: Diagnosis present

## 2020-09-14 DIAGNOSIS — Z6837 Body mass index (BMI) 37.0-37.9, adult: Secondary | ICD-10-CM | POA: Diagnosis not present

## 2020-09-14 DIAGNOSIS — M1990 Unspecified osteoarthritis, unspecified site: Secondary | ICD-10-CM | POA: Diagnosis not present

## 2020-09-14 DIAGNOSIS — Z79899 Other long term (current) drug therapy: Secondary | ICD-10-CM | POA: Diagnosis not present

## 2020-09-14 DIAGNOSIS — Z7982 Long term (current) use of aspirin: Secondary | ICD-10-CM | POA: Diagnosis not present

## 2020-09-14 DIAGNOSIS — L97919 Non-pressure chronic ulcer of unspecified part of right lower leg with unspecified severity: Secondary | ICD-10-CM | POA: Diagnosis not present

## 2020-09-14 DIAGNOSIS — G8918 Other acute postprocedural pain: Secondary | ICD-10-CM | POA: Diagnosis not present

## 2020-09-14 DIAGNOSIS — Y835 Amputation of limb(s) as the cause of abnormal reaction of the patient, or of later complication, without mention of misadventure at the time of the procedure: Secondary | ICD-10-CM | POA: Diagnosis present

## 2020-09-14 DIAGNOSIS — M869 Osteomyelitis, unspecified: Secondary | ICD-10-CM | POA: Diagnosis not present

## 2020-09-14 DIAGNOSIS — E1129 Type 2 diabetes mellitus with other diabetic kidney complication: Secondary | ICD-10-CM | POA: Diagnosis not present

## 2020-09-14 DIAGNOSIS — Z89512 Acquired absence of left leg below knee: Secondary | ICD-10-CM

## 2020-09-14 DIAGNOSIS — I509 Heart failure, unspecified: Secondary | ICD-10-CM | POA: Diagnosis present

## 2020-09-14 DIAGNOSIS — J45909 Unspecified asthma, uncomplicated: Secondary | ICD-10-CM | POA: Diagnosis present

## 2020-09-14 DIAGNOSIS — Z4801 Encounter for change or removal of surgical wound dressing: Secondary | ICD-10-CM | POA: Diagnosis not present

## 2020-09-14 DIAGNOSIS — M86161 Other acute osteomyelitis, right tibia and fibula: Secondary | ICD-10-CM | POA: Diagnosis not present

## 2020-09-14 HISTORY — PX: AMPUTATION: SHX166

## 2020-09-14 LAB — CBC
HCT: 32.7 % — ABNORMAL LOW (ref 36.0–46.0)
Hemoglobin: 9.9 g/dL — ABNORMAL LOW (ref 12.0–15.0)
MCH: 25.3 pg — ABNORMAL LOW (ref 26.0–34.0)
MCHC: 30.3 g/dL (ref 30.0–36.0)
MCV: 83.6 fL (ref 80.0–100.0)
Platelets: 486 10*3/uL — ABNORMAL HIGH (ref 150–400)
RBC: 3.91 MIL/uL (ref 3.87–5.11)
RDW: 17.4 % — ABNORMAL HIGH (ref 11.5–15.5)
WBC: 13.5 10*3/uL — ABNORMAL HIGH (ref 4.0–10.5)
nRBC: 0 % (ref 0.0–0.2)

## 2020-09-14 LAB — GLUCOSE, CAPILLARY
Glucose-Capillary: 76 mg/dL (ref 70–99)
Glucose-Capillary: 72 mg/dL (ref 70–99)
Glucose-Capillary: 105 mg/dL — ABNORMAL HIGH (ref 70–99)
Glucose-Capillary: 159 mg/dL — ABNORMAL HIGH (ref 70–99)
Glucose-Capillary: 252 mg/dL — ABNORMAL HIGH (ref 70–99)

## 2020-09-14 LAB — COMPREHENSIVE METABOLIC PANEL
ALT: 9 U/L (ref 0–44)
AST: 13 U/L — ABNORMAL LOW (ref 15–41)
Albumin: 2.6 g/dL — ABNORMAL LOW (ref 3.5–5.0)
Alkaline Phosphatase: 48 U/L (ref 38–126)
Anion gap: 9 (ref 5–15)
BUN: 18 mg/dL (ref 8–23)
CO2: 29 mmol/L (ref 22–32)
Calcium: 8.5 mg/dL — ABNORMAL LOW (ref 8.9–10.3)
Chloride: 96 mmol/L — ABNORMAL LOW (ref 98–111)
Creatinine, Ser: 7.38 mg/dL — ABNORMAL HIGH (ref 0.44–1.00)
GFR, Estimated: 6 mL/min — ABNORMAL LOW (ref >60)
Glucose, Bld: 99 mg/dL (ref 70–99)
Potassium: 4.3 mmol/L (ref 3.5–5.1)
Sodium: 134 mmol/L — ABNORMAL LOW (ref 135–145)
Total Bilirubin: 0.8 mg/dL (ref 0.3–1.2)
Total Protein: 7.5 g/dL (ref 6.5–8.1)

## 2020-09-14 LAB — PROTIME-INR
INR: 1.1 (ref 0.8–1.2)
Prothrombin Time: 14.3 seconds (ref 11.4–15.2)

## 2020-09-14 LAB — APTT: aPTT: 32 seconds (ref 24–36)

## 2020-09-14 LAB — HEPATITIS B SURFACE ANTIBODY,QUALITATIVE: Hep B S Ab: NONREACTIVE

## 2020-09-14 LAB — HEPATITIS B SURFACE ANTIGEN: Hepatitis B Surface Ag: NONREACTIVE

## 2020-09-14 SURGERY — AMPUTATION BELOW KNEE
Anesthesia: General | Site: Knee | Laterality: Right

## 2020-09-14 MED ORDER — SODIUM CHLORIDE 0.9 % IV SOLN: INTRAVENOUS | Status: DC

## 2020-09-14 MED ORDER — LIDOCAINE 2% (20 MG/ML) 5 ML SYRINGE
INTRAMUSCULAR | Status: DC | PRN
  Administered 2020-09-14: 20 mg via INTRAVENOUS

## 2020-09-14 MED ORDER — CHLORHEXIDINE GLUCONATE CLOTH 2 % EX PADS
6.0000 | MEDICATED_PAD | Freq: Every day | CUTANEOUS | Status: DC
  Administered 2020-09-15 – 2020-09-18 (×3): 6 via TOPICAL

## 2020-09-14 MED ORDER — ASPIRIN EC 81 MG PO TBEC
81.0000 mg | DELAYED_RELEASE_TABLET | Freq: Every day | ORAL | Status: DC
  Administered 2020-09-15 – 2020-09-17 (×3): 81 mg via ORAL
  Filled 2020-09-14 (×3): qty 1

## 2020-09-14 MED ORDER — BUPIVACAINE-EPINEPHRINE (PF) 0.5% -1:200000 IJ SOLN
INTRAMUSCULAR | Status: DC | PRN
  Administered 2020-09-14: 15 mL via PERINEURAL

## 2020-09-14 MED ORDER — CHLORHEXIDINE GLUCONATE 0.12 % MT SOLN
15.0000 mL | Freq: Once | OROMUCOSAL | Status: AC
  Administered 2020-09-14: 15 mL via OROMUCOSAL
  Filled 2020-09-14: qty 15

## 2020-09-14 MED ORDER — ONDANSETRON HCL 4 MG/2ML IJ SOLN
INTRAMUSCULAR | Status: AC
  Filled 2020-09-14: qty 4

## 2020-09-14 MED ORDER — DEXAMETHASONE SODIUM PHOSPHATE 10 MG/ML IJ SOLN
INTRAMUSCULAR | Status: DC | PRN
  Administered 2020-09-14: 5 mg via INTRAVENOUS

## 2020-09-14 MED ORDER — MIDAZOLAM HCL 2 MG/2ML IJ SOLN
INTRAMUSCULAR | Status: AC
  Filled 2020-09-14: qty 2

## 2020-09-14 MED ORDER — SENNOSIDES-DOCUSATE SODIUM 8.6-50 MG PO TABS
1.0000 | ORAL_TABLET | Freq: Two times a day (BID) | ORAL | Status: DC
Start: 2020-09-14 — End: 2020-09-18
  Administered 2020-09-15 – 2020-09-18 (×4): 1 via ORAL
  Filled 2020-09-14 (×8): qty 1

## 2020-09-14 MED ORDER — INSULIN DETEMIR 100 UNIT/ML ~~LOC~~ SOLN
16.0000 [IU] | Freq: Every day | SUBCUTANEOUS | Status: DC
  Administered 2020-09-14 – 2020-09-18 (×5): 16 [IU] via SUBCUTANEOUS
  Filled 2020-09-14 (×5): qty 0.16

## 2020-09-14 MED ORDER — ALBUTEROL SULFATE (2.5 MG/3ML) 0.083% IN NEBU: 2.5000 mg | INHALATION_SOLUTION | Freq: Four times a day (QID) | RESPIRATORY_TRACT | Status: DC | PRN

## 2020-09-14 MED ORDER — ONDANSETRON HCL 4 MG/2ML IJ SOLN
INTRAMUSCULAR | Status: DC | PRN
  Administered 2020-09-14: 4 mg via INTRAVENOUS

## 2020-09-14 MED ORDER — CEFAZOLIN SODIUM-DEXTROSE 2-4 GM/100ML-% IV SOLN
2.0000 g | INTRAVENOUS | Status: AC
  Administered 2020-09-14: 2 g via INTRAVENOUS
  Filled 2020-09-14: qty 100

## 2020-09-14 MED ORDER — FENTANYL CITRATE (PF) 250 MCG/5ML IJ SOLN
INTRAMUSCULAR | Status: AC
  Filled 2020-09-14: qty 5

## 2020-09-14 MED ORDER — ACETAMINOPHEN 650 MG RE SUPP: 650.0000 mg | Freq: Four times a day (QID) | RECTAL | Status: DC | PRN

## 2020-09-14 MED ORDER — METOPROLOL SUCCINATE ER 50 MG PO TB24
50.0000 mg | ORAL_TABLET | Freq: Every day | ORAL | Status: DC
  Administered 2020-09-14 – 2020-09-17 (×4): 50 mg via ORAL
  Filled 2020-09-14 (×4): qty 1

## 2020-09-14 MED ORDER — FENTANYL CITRATE (PF) 100 MCG/2ML IJ SOLN: 25.0000 ug | INTRAMUSCULAR | Status: DC | PRN

## 2020-09-14 MED ORDER — ONDANSETRON HCL 4 MG/2ML IJ SOLN: 4.0000 mg | Freq: Four times a day (QID) | INTRAMUSCULAR | Status: DC | PRN

## 2020-09-14 MED ORDER — RENA-VITE PO TABS
1.0000 | ORAL_TABLET | Freq: Every day | ORAL | Status: DC
  Administered 2020-09-14 – 2020-09-17 (×4): 1 via ORAL
  Filled 2020-09-14 (×4): qty 1

## 2020-09-14 MED ORDER — MIDAZOLAM HCL 2 MG/2ML IJ SOLN
INTRAMUSCULAR | Status: AC
  Administered 2020-09-14: 2 mg
  Filled 2020-09-14: qty 2

## 2020-09-14 MED ORDER — TRAMADOL HCL 50 MG PO TABS
50.0000 mg | ORAL_TABLET | Freq: Two times a day (BID) | ORAL | Status: DC | PRN
  Administered 2020-09-16 – 2020-09-17 (×2): 50 mg via ORAL
  Filled 2020-09-14 (×2): qty 1

## 2020-09-14 MED ORDER — POLYETHYLENE GLYCOL 3350 17 G PO PACK
17.0000 g | PACK | Freq: Every day | ORAL | Status: DC
  Administered 2020-09-18: 17 g via ORAL
  Filled 2020-09-14 (×4): qty 1

## 2020-09-14 MED ORDER — FENTANYL CITRATE (PF) 100 MCG/2ML IJ SOLN
INTRAMUSCULAR | Status: AC
  Administered 2020-09-14: 50 ug
  Filled 2020-09-14: qty 2

## 2020-09-14 MED ORDER — ORAL CARE MOUTH RINSE: 15.0000 mL | Freq: Once | OROMUCOSAL | Status: AC

## 2020-09-14 MED ORDER — SEVELAMER CARBONATE 800 MG PO TABS: 1600.0000 mg | ORAL_TABLET | ORAL | Status: DC

## 2020-09-14 MED ORDER — BUPIVACAINE LIPOSOME 1.3 % IJ SUSP
INTRAMUSCULAR | Status: DC | PRN
  Administered 2020-09-14: 10 mL via PERINEURAL

## 2020-09-14 MED ORDER — PROPOFOL 10 MG/ML IV BOLUS
INTRAVENOUS | Status: AC
  Filled 2020-09-14: qty 20

## 2020-09-14 MED ORDER — GABAPENTIN 300 MG PO CAPS
300.0000 mg | ORAL_CAPSULE | Freq: Every day | ORAL | Status: DC
  Administered 2020-09-14 – 2020-09-17 (×4): 300 mg via ORAL
  Filled 2020-09-14 (×4): qty 1

## 2020-09-14 MED ORDER — AMLODIPINE BESYLATE 5 MG PO TABS
5.0000 mg | ORAL_TABLET | Freq: Every day | ORAL | Status: DC
  Administered 2020-09-14 – 2020-09-17 (×4): 5 mg via ORAL
  Filled 2020-09-14 (×4): qty 1

## 2020-09-14 MED ORDER — SEVELAMER CARBONATE 800 MG PO TABS
2400.0000 mg | ORAL_TABLET | Freq: Three times a day (TID) | ORAL | Status: DC
  Administered 2020-09-14 – 2020-09-18 (×12): 2400 mg via ORAL
  Filled 2020-09-14 (×12): qty 3

## 2020-09-14 MED ORDER — INSULIN ASPART 100 UNIT/ML IJ SOLN
0.0000 [IU] | Freq: Three times a day (TID) | INTRAMUSCULAR | Status: DC
  Administered 2020-09-14: 2 [IU] via SUBCUTANEOUS

## 2020-09-14 MED ORDER — ACETAMINOPHEN 325 MG PO TABS
650.0000 mg | ORAL_TABLET | Freq: Four times a day (QID) | ORAL | Status: DC | PRN
  Administered 2020-09-18: 650 mg via ORAL
  Filled 2020-09-14: qty 2

## 2020-09-14 MED ORDER — ONDANSETRON HCL 4 MG PO TABS
4.0000 mg | ORAL_TABLET | Freq: Four times a day (QID) | ORAL | Status: DC | PRN
Start: 2020-09-14 — End: 2020-09-18

## 2020-09-14 MED ORDER — ROPIVACAINE HCL 5 MG/ML IJ SOLN
INTRAMUSCULAR | Status: DC | PRN
  Administered 2020-09-14: 20 mL via PERINEURAL

## 2020-09-14 MED ORDER — ATORVASTATIN CALCIUM 40 MG PO TABS
40.0000 mg | ORAL_TABLET | Freq: Every day | ORAL | Status: DC
  Administered 2020-09-14 – 2020-09-17 (×4): 40 mg via ORAL
  Filled 2020-09-14 (×4): qty 1

## 2020-09-14 MED ORDER — DEXAMETHASONE SODIUM PHOSPHATE 10 MG/ML IJ SOLN
INTRAMUSCULAR | Status: AC
  Filled 2020-09-14: qty 2

## 2020-09-14 MED ORDER — LIDOCAINE 2% (20 MG/ML) 5 ML SYRINGE
INTRAMUSCULAR | Status: AC
  Filled 2020-09-14: qty 5

## 2020-09-14 MED ORDER — HEPARIN SODIUM (PORCINE) 5000 UNIT/ML IJ SOLN
5000.0000 [IU] | Freq: Three times a day (TID) | INTRAMUSCULAR | Status: DC
  Administered 2020-09-15 – 2020-09-18 (×8): 5000 [IU] via SUBCUTANEOUS
  Filled 2020-09-14 (×8): qty 1

## 2020-09-14 MED ORDER — SODIUM CHLORIDE 0.9% FLUSH
3.0000 mL | Freq: Two times a day (BID) | INTRAVENOUS | Status: DC
  Administered 2020-09-14 – 2020-09-18 (×9): 3 mL via INTRAVENOUS

## 2020-09-14 MED ORDER — CHLORHEXIDINE GLUCONATE CLOTH 2 % EX PADS: 6.0000 | MEDICATED_PAD | Freq: Once | CUTANEOUS | Status: DC

## 2020-09-14 MED ORDER — CLOPIDOGREL BISULFATE 75 MG PO TABS
75.0000 mg | ORAL_TABLET | Freq: Every evening | ORAL | Status: DC
  Administered 2020-09-15: 75 mg via ORAL
  Filled 2020-09-14: qty 1

## 2020-09-14 MED ORDER — 0.9 % SODIUM CHLORIDE (POUR BTL) OPTIME
TOPICAL | Status: DC | PRN
  Administered 2020-09-14: 1000 mL

## 2020-09-14 MED ORDER — SEVELAMER CARBONATE 800 MG PO TABS
1600.0000 mg | ORAL_TABLET | ORAL | Status: DC
  Administered 2020-09-16: 1600 mg via ORAL
  Filled 2020-09-14 (×3): qty 2

## 2020-09-14 MED ORDER — OXYCODONE HCL 5 MG PO TABS
5.0000 mg | ORAL_TABLET | ORAL | Status: DC | PRN
Start: 2020-09-14 — End: 2020-09-18
  Administered 2020-09-14 – 2020-09-18 (×8): 5 mg via ORAL
  Filled 2020-09-14 (×8): qty 1

## 2020-09-14 MED ORDER — PROPOFOL 10 MG/ML IV BOLUS
INTRAVENOUS | Status: DC | PRN
  Administered 2020-09-14: 50 mg via INTRAVENOUS
  Administered 2020-09-14: 150 mg via INTRAVENOUS

## 2020-09-14 MED ORDER — INSULIN ASPART 100 UNIT/ML IJ SOLN
0.0000 [IU] | Freq: Three times a day (TID) | INTRAMUSCULAR | Status: DC
  Administered 2020-09-15 – 2020-09-17 (×4): 1 [IU] via SUBCUTANEOUS

## 2020-09-14 SURGICAL SUPPLY — 50 items
BANDAGE ESMARK 6X9 LF (GAUZE/BANDAGES/DRESSINGS) IMPLANT
BLADE LONG MED 31X9 (MISCELLANEOUS) IMPLANT
BLADE SAW GIGLI 510 (BLADE) ×2 IMPLANT
BNDG COHESIVE 6X5 TAN STRL LF (GAUZE/BANDAGES/DRESSINGS) ×2 IMPLANT
BNDG ELASTIC 4X5.8 VLCR STR LF (GAUZE/BANDAGES/DRESSINGS) ×4 IMPLANT
BNDG ELASTIC 6X5.8 VLCR STR LF (GAUZE/BANDAGES/DRESSINGS) IMPLANT
BNDG ESMARK 6X9 LF (GAUZE/BANDAGES/DRESSINGS)
BNDG GAUZE ELAST 4 BULKY (GAUZE/BANDAGES/DRESSINGS) ×2 IMPLANT
CANISTER SUCT 3000ML PPV (MISCELLANEOUS) ×2 IMPLANT
CLIP LIGATING EXTRA MED SLVR (CLIP) ×2 IMPLANT
CLIP LIGATING EXTRA SM BLUE (MISCELLANEOUS) ×2 IMPLANT
CLIP VESOCCLUDE MED 6/CT (CLIP) IMPLANT
COVER SURGICAL LIGHT HANDLE (MISCELLANEOUS) ×2 IMPLANT
COVER WAND RF STERILE (DRAPES) ×2 IMPLANT
CUFF TOURN SGL QUICK 34 (TOURNIQUET CUFF)
CUFF TOURN SGL QUICK 42 (TOURNIQUET CUFF) IMPLANT
CUFF TRNQT CYL 34X4.125X (TOURNIQUET CUFF) IMPLANT
DRAIN CHANNEL 19F RND (DRAIN) IMPLANT
DRAPE HALF SHEET 40X57 (DRAPES) ×2 IMPLANT
DRAPE ORTHO SPLIT 77X108 STRL (DRAPES) ×2
DRAPE SURG ORHT 6 SPLT 77X108 (DRAPES) ×2 IMPLANT
DRSG ADAPTIC 3X8 NADH LF (GAUZE/BANDAGES/DRESSINGS) ×2 IMPLANT
ELECT REM PT RETURN 9FT ADLT (ELECTROSURGICAL) ×2
ELECTRODE REM PT RTRN 9FT ADLT (ELECTROSURGICAL) ×1 IMPLANT
EVACUATOR SILICONE 100CC (DRAIN) IMPLANT
GAUZE SPONGE 4X4 12PLY STRL (GAUZE/BANDAGES/DRESSINGS) ×2 IMPLANT
GLOVE BIO SURGEON STRL SZ7.5 (GLOVE) ×2 IMPLANT
GOWN STRL REUS W/ TWL LRG LVL3 (GOWN DISPOSABLE) ×2 IMPLANT
GOWN STRL REUS W/ TWL XL LVL3 (GOWN DISPOSABLE) ×1 IMPLANT
GOWN STRL REUS W/TWL LRG LVL3 (GOWN DISPOSABLE) ×2
GOWN STRL REUS W/TWL XL LVL3 (GOWN DISPOSABLE) ×1
KIT BASIN OR (CUSTOM PROCEDURE TRAY) ×2 IMPLANT
KIT TURNOVER KIT B (KITS) ×2 IMPLANT
NS IRRIG 1000ML POUR BTL (IV SOLUTION) ×2 IMPLANT
PACK GENERAL/GYN (CUSTOM PROCEDURE TRAY) ×2 IMPLANT
PAD ARMBOARD 7.5X6 YLW CONV (MISCELLANEOUS) ×4 IMPLANT
STAPLER VISISTAT 35W (STAPLE) ×2 IMPLANT
STOCKINETTE IMPERVIOUS LG (DRAPES) ×2 IMPLANT
SUT BONE WAX W31G (SUTURE) IMPLANT
SUT ETHILON 3 0 PS 1 (SUTURE) IMPLANT
SUT SILK 0 TIES 10X30 (SUTURE) ×2 IMPLANT
SUT SILK 2 0 (SUTURE) ×1
SUT SILK 2 0 SH CR/8 (SUTURE) ×2 IMPLANT
SUT SILK 2-0 18XBRD TIE 12 (SUTURE) ×1 IMPLANT
SUT SILK 3 0 (SUTURE)
SUT SILK 3-0 18XBRD TIE 12 (SUTURE) IMPLANT
SUT VIC AB 2-0 CT1 18 (SUTURE) ×6 IMPLANT
TOWEL GREEN STERILE (TOWEL DISPOSABLE) ×4 IMPLANT
UNDERPAD 30X36 HEAVY ABSORB (UNDERPADS AND DIAPERS) ×2 IMPLANT
WATER STERILE IRR 1000ML POUR (IV SOLUTION) ×2 IMPLANT

## 2020-09-14 NOTE — H&P (Signed)
History and Physical    Christy Zhang [name] DOB: 11/25/1955 DOA: 09/14/2020  Referring MD/NP/PA: Servando Snare, MD PCP: Claretta Fraise, MD  Patient coming from: PACU  Chief Complaint: Nonhealing amputation  I have personally briefly reviewed patient's old medical records in [place]   HPI: Christy Zhang is a 65 y.o. female with medical history significant of ESRD on HD(Monday, Wednesday, Friday),  DM type II, PAD with prior right TMA with wound VAC on 08/11/2020 by Dr. Donzetta Matters who presenting for nonhealing right transmetatarsal amputation.  She had followed up in office on 5/10 for postop wound check and wound VAC changes noted to have purulent drainage.  Patient was taken to the operating room today and underwent right BKA by Dr. Donzetta Matters with only minimal blood loss reported.  Patient had gone to hemodialysis last yesterday.  Denies any complaints of shortness of breath, chest pain, orthopnea, nausea, vomiting, or fevers.  ED Course: As seen above  Review of Systems  Musculoskeletal: Positive for joint pain and myalgias.  Otherwise a complete 10 point review of systems was performed and negative except for as noted above in HPI.  Past Medical History:  Diagnosis Date  . Anemia   . Arthritis   . Asthma    in the past  . CHF (congestive heart failure) ([place])   . Chronic kidney disease    Renal failure, dialysis on M/W/F  . 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 End stage renal disease ([place])   . End stage renal disease on dialysis ([place])   . Gangrene ([place])   . Hemodialysis access, arteriovenous graft ([place])   . Hyperglycemia due to diabetes mellitus ([place]) 10/15/2019  . Hyperlipidemia   . Hypertension   . Neuromuscular disorder [facility])     Past Surgical History:  Procedure Laterality Date  .  LEFT BELOW KNEE AMPUTATION (Left Knee)  06/08/2020  . A/V FISTULAGRAM Right 02/04/2017   Procedure: A/V Fistulagram;  Surgeon: Waynetta Sandy, MD;   Location: [place] [facility];  Service: Cardiovascular;  Laterality: Right;  . ABDOMINAL AORTOGRAM W/LOWER EXTREMITY N/A 11/18/2019   Procedure: ABDOMINAL AORTOGRAM W/LOWER EXTREMITY;  Surgeon: [name], MD;  Location: [place] [facility];  Service: Cardiovascular;  Laterality: N/A;  . ABDOMINAL AORTOGRAM W/LOWER EXTREMITY N/A 02/23/2020   Procedure: ABDOMINAL AORTOGRAM W/LOWER EXTREMITY;  Surgeon: [name], MD;  Location: [place] [facility];  Service: Cardiovascular;  Laterality: N/A;  Bilateral  . ABDOMINAL AORTOGRAM W/LOWER EXTREMITY N/A 08/10/2020   Procedure: ABDOMINAL AORTOGRAM W/LOWER EXTREMITY;  Surgeon: [name], MD;  Location: [place] [facility];  Service: Cardiovascular;  Laterality: N/A;  . AMPUTATION Left 06/08/2020   Procedure: LEFT BELOW KNEE AMPUTATION;  Surgeon: Cherre Robins, MD;  Location: [place];  Service: Vascular;  Laterality: Left;  . AV FISTULA PLACEMENT Right 08/25/2014   Procedure: RIGHT BRACHIOCEPHALIC ARTERIOVENOUS (AV) FISTULA CREATION;  Surgeon: Mal Misty, MD;  Location: [place];  Service: Vascular;  Laterality: Right;  . AV FISTULA PLACEMENT Right 03/18/2019   Procedure: INSERTION OF ARTERIOVENOUS (AV) GORE-TEX GRAFT RIGHT ARM;  Surgeon: Serafina Mitchell, MD;  Location: [place];  Service: Vascular;  Laterality: Right;  . BASCILIC VEIN TRANSPOSITION Right 12/24/2018   Procedure: FIRST STAGE BASILIC VEIN TRANSPOSITION RIGHT UPPER ARM;  Surgeon: Serafina Mitchell, MD;  Location: [place];  Service: Vascular;  Laterality: Right;  . FISTULA SUPERFICIALIZATION Right 11/03/2014   Procedure: RIGHT UPPER  ARM FISTULA SUPERFICIALIZATION;  Surgeon: Mal Misty, MD;  Location: [place];  Service: Vascular;  Laterality: Right;  . FISTULOGRAM Right 01/16/2016   Procedure: RIGHT UPPER EXTREMITY ARTERIOVENOUS FISTULOGRAM WITH BALLOON ANGIOPLASTY;  Surgeon: Vickie Epley, MD;  Location: [facility];  Service: Vascular;  Laterality: Right;  . INSERTION OF  DIALYSIS CATHETER     X4  . IR FLUORO GUIDE CV LINE RIGHT  12/03/2018  . IR REMOVAL TUN CV CATH W/O FL  07/06/2019  . IR THROMBECTOMY AV FISTULA W/THROMBOLYSIS/PTA INC/SHUNT/IMG RIGHT Right 12/03/2018  . IR THROMBECTOMY AV FISTULA W/THROMBOLYSIS/PTA/STENT INC/SHUNT/IMG RT Right 10/11/2019  . IR US GUIDE VASC ACCESS RIGHT  12/03/2018  . IR US GUIDE VASC ACCESS RIGHT  12/03/2018  . IR US GUIDE VASC ACCESS RIGHT  10/14/2019  . LIGATION OF COMPETING BRANCHES OF ARTERIOVENOUS FISTULA Right 11/03/2014   Procedure: LIGATION OF COMPETING BRANCHE x1 OF RIGHT UPPER ARM ARTERIOVENOUS FISTULA;  Surgeon: Mal Misty, MD;  Location: [place];  Service: Vascular;  Laterality: Right;  . PERIPHERAL VASCULAR BALLOON ANGIOPLASTY Right 02/04/2017   Procedure: PERIPHERAL VASCULAR BALLOON ANGIOPLASTY;  Surgeon: Waynetta Sandy, MD;  Location: [place] [facility];  Service: Cardiovascular;  Laterality: Right;  . PERIPHERAL VASCULAR BALLOON ANGIOPLASTY Right 11/18/2019   Procedure: PERIPHERAL VASCULAR BALLOON ANGIOPLASTY;  Surgeon: [name], MD;  Location: [place] [facility];  Service: Cardiovascular;  Laterality: Right;  anterior tibial  . PERIPHERAL VASCULAR BALLOON ANGIOPLASTY  02/23/2020   Procedure: PERIPHERAL VASCULAR BALLOON ANGIOPLASTY;  Surgeon: [name], MD;  Location: [place] [facility];  Service: Cardiovascular;;  Lt  AT  . PERIPHERAL VASCULAR INTERVENTION Right 08/10/2020   Procedure: PERIPHERAL VASCULAR INTERVENTION;  Surgeon: [name], MD;  Location: [place] [facility];  Service: Cardiovascular;  Laterality: Right;  . TRANSMETATARSAL AMPUTATION Left 02/24/2020   Procedure: TRANSMETATARSAL AMPUTATION LEFT;  Surgeon: Cherre Robins, MD;  Location: [place];  Service: Vascular;  Laterality: Left;  . TRANSMETATARSAL AMPUTATION Right 08/11/2020   Procedure: RIGHT TRANSMETATARSAL AMPUTATION;  Surgeon: Waynetta Sandy, MD;  Location: [place];  Service: Vascular;  Laterality:  Right;  . TUBAL LIGATION       reports that she quit smoking about 36 years ago. Her smoking use included cigarettes. She has never used smokeless tobacco. She reports that she does not drink alcohol and does not use drugs.  Allergies  Allergen Reactions  . Sulfa Antibiotics Swelling    Family History  Problem Relation Age of Onset  . Diabetes Mother   . Hypertension Mother   . Cancer Father   . Diabetes Father   . Hypertension Sister   . Diabetes Daughter   . Heart disease Daughter   . Hypertension Daughter   . Diabetes Son   . Heart disease Son   . Hypertension Son   . Peripheral vascular disease Son        amputation    Prior to Admission medications   Medication Sig Start Date End Date Taking? Authorizing Provider  acetaminophen (TYLENOL) 325 MG tablet Take 1-2 tablets (325-650 mg total) by mouth every 4 (four) hours as needed for mild pain. 07/04/20  Yes Love, Ivan Anchors, PA-C  amLODipine (NORVASC) 5 MG tablet TAKE ONE TABLET BY MOUTH AT BEDTIME 09/06/20  Yes [name], [name], MD  aspirin EC 81 MG tablet Take 81 mg by mouth at bedtime. Swallow whole.   Yes [provider]  atorvastatin (LIPITOR) 40 MG tablet TAKE ONE TABLET  BY MOUTH DAILY Patient taking differently: at bedtime. 08/21/20  Yes Claretta Fraise, MD  diphenhydrAMINE (BENADRYL) 25 MG tablet Take 25 mg by mouth every 6 (six) hours as needed for itching.   Yes [provider]  gabapentin (NEURONTIN) 300 MG capsule Take 1 capsule (300 mg total) by mouth at bedtime. 07/27/20  Yes Bayard Hugger, NP  insulin detemir (LEVEMIR) 100 UNIT/ML injection Inject 0.16 mLs (16 Units total) into the skin daily. 08/31/20  Yes [name], [name], MD  metoprolol succinate (TOPROL-XL) 50 MG 24 hr tablet TAKE ONE TABLET BY MOUTH DAILY. TAKE WITH OR IMMEDIATELY FOLLOWING A MEAL Patient taking differently: at bedtime. 09/06/20  Yes [name], [name], MD  multivitamin (RENA-VIT) TABS tablet Take 1 tablet by mouth at bedtime.    Yes  [provider]  oxyCODONE (OXY IR/ROXICODONE) 5 MG immediate release tablet Take 1 tablet (5 mg total) by mouth every 6 (six) hours as needed for severe pain. 08/18/20  Yes Florencia Reasons, MD  senna-docusate (SENOKOT-S) 8.6-50 MG tablet Take 1 tablet by mouth 2 (two) times daily. 07/04/20  Yes Love, Ivan Anchors, PA-C  sevelamer carbonate (RENVELA) 800 MG tablet Take 1,600-2,400 mg by mouth See admin instructions. Take 2400 mg with each meal and 1600 mg with each snack   Yes [provider]  traMADol (ULTRAM) 50 MG tablet Take 1 tablet (50 mg total) by mouth every 12 (twelve) hours as needed for moderate pain. 07/27/20  Yes Bayard Hugger, NP  TRULICITY 1.5 0000000 SOPN INJECT CONTENTs OF ONE PEN UNDER THE SKIN WEEKLY ON SATURDAY 08/24/20  Yes Claretta Fraise, MD  clopidogrel (PLAVIX) 75 MG tablet Take 1 tablet (75 mg total) by mouth daily. Patient taking differently: No sig reported 11/18/19 11/17/20  [name], MD  Insulin Syringe-Needle U-100 28G X 1/2" 1 ML MISC Use with insulin daily Dx E11.22 08/31/20   Claretta Fraise, MD  polyethylene glycol (MIRALAX / GLYCOLAX) 17 g packet Take 17 g by mouth daily. 06/17/20   Regalado, Cassie Freer, MD    Physical Exam:  Constitutional: Older female who is currently lethargic following her procedure Vitals:   09/14/20 1226 09/14/20 1241 09/14/20 1326 09/14/20 1349  BP: (!) 151/67 (!) 148/69 (!) 152/67 (!) 150/70  Pulse: 70 72 77 80  Resp: 14 13 (!) 21 15  Temp:   (!) 97.4 F (36.3 C) 98 F (36.7 C)  TempSrc:    Oral  SpO2: 100% 100% 100% 100%  Weight:      Height:       Eyes: PERRL, lids and conjunctivae normal ENMT: Mucous membranes are moist. Posterior pharynx clear of any exudate or lesions. Poor dentition Neck: normal, supple, no masses, no thyromegaly Respiratory: Normal respiratory effort with decreased overall aeration.  No significant wheezes or crackles appreciated.  Patient currently on 2 L nasal cannula oxygen with O2  saturations maintained. Cardiovascular: Regular rate and rhythm, no murmurs / rubs / gallops. No extremity edema. 2+ pedal pulses. No carotid bruits.  Right upper fistula appreciated Abdomen: no tenderness, no masses palpated. No hepatosplenomegaly. Bowel sounds positive.  Musculoskeletal: no clubbing / cyanosis.  Right BKA currently wrapped.  Left BKA without injury. Skin: no rashes, lesions, ulcers. No induration Neurologic: CN 2-12 grossly intact.  Able to move all extremities Psychiatric: Normal judgment and insight.  Lethargic and oriented x 3. Normal mood.     Labs on Admission: I have personally reviewed following labs and imaging studies  CBC: Recent Labs  Lab  09/14/20 0731  WBC 13.5*  HGB 9.9*  HCT 32.7*  MCV 83.6  PLT [id number]*   Basic Metabolic Panel: Recent Labs  Lab 09/14/20 0731  NA 134*  K 4.3  CL 96*  CO2 29  GLUCOSE 99  BUN 18  CREATININE 7.38*  CALCIUM 8.5*   GFR: Estimated Creatinine Clearance: 8.7 mL/min (A) (by C-G formula based on SCr of 7.38 mg/dL (H)). Liver Function Tests: Recent Labs  Lab 09/14/20 0731  AST 13*  ALT 9  ALKPHOS 48  BILITOT 0.8  PROT 7.5  ALBUMIN 2.6*   No results for input(s): LIPASE, AMYLASE in the last 168 hours. No results for input(s): AMMONIA in the last 168 hours. Coagulation Profile: Recent Labs  Lab 09/14/20 0731  INR 1.1   Cardiac Enzymes: No results for input(s): CKTOTAL, CKMB, CKMBINDEX, TROPONINI in the last 168 hours. BNP (last 3 results) No results for input(s): PROBNP in the last 8760 hours. HbA1C: No results for input(s): HGBA1C in the last 72 hours. CBG: Recent Labs  Lab 09/14/20 0647 09/14/20 0906 09/14/20 1030  GLUCAP 76 72 105*   Lipid Profile: No results for input(s): CHOL, HDL, LDLCALC, TRIG, CHOLHDL, LDLDIRECT in the last 72 hours. Thyroid Function Tests: No results for input(s): TSH, T4TOTAL, FREET4, T3FREE, THYROIDAB in the last 72 hours. Anemia Panel: No results for input(s):  VITAMINB12, FOLATE, FERRITIN, TIBC, IRON, RETICCTPCT in the last 72 hours. Urine analysis:    Component Value Date/Time   COLORURINE YELLOW 06/20/2020 [place] 06/20/2020 0927   LABSPEC 1.012 06/20/2020 0927   PHURINE 5.0 06/20/2020 0927   GLUCOSEU NEGATIVE 06/20/2020 0927   HGBUR SMALL (A) 06/20/2020 0927   BILIRUBINUR NEGATIVE 06/20/2020 0927   KETONESUR NEGATIVE 06/20/2020 0927   PROTEINUR 30 (A) 06/20/2020 0927   NITRITE NEGATIVE 06/20/2020 0927   LEUKOCYTESUR NEGATIVE 06/20/2020 0927   Sepsis Labs: Recent Results (from the past 240 hour(s))  SARS CORONAVIRUS 2 (TAT 6-24 HRS) Nasopharyngeal Nasopharyngeal Swab     Status: None   Collection Time: 09/13/20  2:29 PM   Specimen: Nasopharyngeal Swab  Result Value Ref Range Status   SARS Coronavirus 2 NEGATIVE NEGATIVE Final    Comment: (NOTE) SARS-CoV-2 target nucleic acids are NOT DETECTED.  The SARS-CoV-2 RNA is generally detectable in upper and lower respiratory specimens during the acute phase of infection. Negative results do not preclude SARS-CoV-2 infection, do not rule out co-infections with other pathogens, and should not be used as the sole basis for treatment or other patient management decisions. Negative results must be combined with clinical observations, patient history, and epidemiological information. The expected result is Negative.  Fact Sheet for Patients: SugarRoll.be  Fact Sheet for Healthcare Providers: https://www.woods-mathews.com/  This test is not yet approved or cleared by the [place] FDA and  has been authorized for detection and/or diagnosis of SARS-CoV-2 by FDA under an Emergency Use Authorization (EUA). This EUA will remain  in effect (meaning this test can be used) for the duration of the COVID-19 declaration under Se ction 564(b)(1) of the Act, 21 U.S.C. section 360bbb-3(b)(1), unless the authorization is terminated  or revoked sooner.  Performed at [facility], [place] [address] [place], [place] [address]      Radiological Exams on Admission: No results found.    Assessment/Plan S/P BKA (below knee amputation) unilateral, right: Patient had a nonhealing transmetatarsal amputation of the right foot for which she underwent an below-knee amputation on the right today with Dr.  Donzetta Matters. -Admit to a surgical telemetry bed -Tramadol/oxycodone as needed for pain -Wound care consult -Transitions of care consulted  ESRD on HD: Patient normally dialyzes on Monday, Wednesday, Friday and had her regular hemodialysis on 5/18.  Patient does not appear to be fluid overloaded. -HD per nephrology scheduled for 5/20   Diabetes mellitus type 2: Patient appears to be relatively well controlled.  Last hemoglobin A1c was 6.5 on 08/09/2020.  Home medications include Trulicity 1.5 mg q. Saturday, and Levemir 16 units q. at bedtime daily -Hypoglycemic protocols -CBGs before every meal  with a very sensitive SSI -Continue home Levemir 16 units.  Anemia of chronic disease: Hemoglobin 9.9 g/dL.  Patient appears to be hemodynamic stable following her procedure. -Check CBC tomorrow morning.  Essential hypertension -Continue metoprolol and amlodipine -Monitor for the possibility of hypotension following amputation and adjust medications as needed  Peripheral vascular disease -Continue statin and restart Plavix tomorrow  Thrombocytosis: Acute.  Likely reactive in nature. -Continue to monitor  Hyperlipidemia -Continue atorvastatin     DVT prophylaxis: Start heparin for DVT prophylaxis tomorrow afternoon Code Status: Full Family Communication: none Disposition Plan: To be determined Consults called: Vascular surgery and nephrology Admission status: Inpatient, require more than 2 midnight stay  Norval Morton MD [facility]   If 7PM-7AM, please contact night-coverage   09/14/2020, 2:03 PM

## 2020-09-14 NOTE — Transfer of Care (Signed)
Immediate Anesthesia Transfer of Care Note  Patient: Christy Zhang  Procedure(s) Performed: RIGHT BELOW KNEE AMPUTATION (Right Knee)  Patient Location: PACU  Anesthesia Type:General  Level of Consciousness: drowsy and patient cooperative  Airway & Oxygen Therapy: Patient Spontanous Breathing and Patient connected to nasal cannula oxygen  Post-op Assessment: Report given to RN and Post -op Vital signs reviewed and stable  Post vital signs: Reviewed and stable  Last Vitals:  Vitals Value Taken Time  BP    Temp    Pulse    Resp    SpO2      Last Pain:  Vitals:   09/14/20 0742  TempSrc:   PainSc: 0-No pain         Complications: No complications documented.

## 2020-09-14 NOTE — Progress Notes (Signed)
Patient to 4E08 from PACU. Vital signs obtained. On monitor CCMD notified. CHG bath completed. Alert to room and call light. Call bell within reach.  Era Bumpers, RN

## 2020-09-14 NOTE — Anesthesia Procedure Notes (Signed)
Procedure Name: LMA Insertion Date/Time: 09/14/2020 9:27 AM Performed by: Moshe Salisbury, CRNA Pre-anesthesia Checklist: Patient identified, Emergency Drugs available, Suction available and Patient being monitored Patient Re-evaluated:Patient Re-evaluated prior to induction Oxygen Delivery Method: Circle System Utilized Preoxygenation: Pre-oxygenation with 100% oxygen Induction Type: IV induction Ventilation: Mask ventilation without difficulty LMA: LMA inserted LMA Size: 5.0 Number of attempts: 1 Intubation method: LMA 4 placed easily but leaking at 10 cm H2O, Removed and replaced with LMA 5 after attempted repositioning. Placement Confirmation: positive ETCO2 Tube secured with: Tape Dental Injury: Teeth and Oropharynx as per pre-operative assessment

## 2020-09-14 NOTE — Op Note (Signed)
    Patient name: Christy Zhang MRN: [id number] DOB: May 31, 1955 Sex: female  09/14/2020 Pre-operative Diagnosis: non healing right transmetatarsal amputation Post-operative diagnosis:  Same Surgeon:  Erlene Quan C. Donzetta Matters, MD Assistant: Risa Grill, PA Procedure Performed:  Right below-knee amputation  Indications: 65 year old female with recent right lower extremity revascularization subsequently underwent transmetatarsal amputation that has failed to heal.  She is now indicated for below-knee amputation.  An assistant was necessary to expedite the case.  Findings: All tissue below the knee appeared healthy.  Flaps were reapproximated without tension.   Procedure:  The patient was identified in the holding area and taken to the operating room where she is placed supine on operative table and LMA anesthesia induced.  She was sterilely prepped draped in the right lower extremity usual fashion, antibiotics were minister timeout was called.  We began by marking out two thirds and 1/3 type incision on her leg below the knee 10 cm from the tibial tuberosity.  We then traced this with 10 blade.  Cautery was carried down to the level of the tibia.  Tibia was transected with Gigli saw.  We dissected down to the fibula this was transected 1 cm higher than the tibia.  Posterior flap was created with amputation knife.  We did thin out some of the soleus muscle.  We suture-ligated the vessels.  The tibial nerve and soleus nerve were pulled on tension tied off and divided.  The bones were smoothed with rasp.  The wound was thoroughly irrigated and hemostasis obtained.  We first reapproximated deep fascia with interrupted 2-0 Vicryl suture followed by superficial fascia of the anterior posterior flaps with interrupted 2-0 Vicryl.  The skin was closed with clips.  A sterile dressing was applied.  She was awakened from anesthesia without immediate complication.  All counts were correct at completion.  EBL: 150  cc   Kerstie Agent C. Donzetta Matters, MD [facility] Office: [phone] Pager: [phone]

## 2020-09-14 NOTE — Anesthesia Postprocedure Evaluation (Signed)
Anesthesia Post Note  Patient: Christy Zhang  Procedure(s) Performed: RIGHT BELOW KNEE AMPUTATION (Right Knee)     Patient location during evaluation: PACU Anesthesia Type: General Level of consciousness: awake and alert Pain management: pain level controlled Vital Signs Assessment: post-procedure vital signs reviewed and stable Respiratory status: spontaneous breathing, nonlabored ventilation, respiratory function stable and patient connected to nasal cannula oxygen Cardiovascular status: blood pressure returned to baseline and stable Postop Assessment: no apparent nausea or vomiting Anesthetic complications: no   No complications documented.  Last Vitals:  Vitals:   09/14/20 1241 09/14/20 1326  BP: (!) 148/69 (!) 152/67  Pulse: 72 77  Resp: 13 (!) 21  Temp:  (!) 36.3 C  SpO2: 100% 100%    Last Pain:  Vitals:   09/14/20 1326  TempSrc:   PainSc: 0-No pain                 Tiajuana Amass

## 2020-09-14 NOTE — Interval H&P Note (Signed)
History and Physical Interval Note:  09/14/2020 7:19 AM  Christy Zhang  has presented today for surgery, with the diagnosis of PAD.  The various methods of treatment have been discussed with the patient and family. After consideration of risks, benefits and other options for treatment, the patient has consented to  Procedure(s): AMPUTATION BELOW KNEE RIGHT (Right) as a surgical intervention.  The patient's history has been reviewed, patient examined, no change in status, stable for surgery.  I have reviewed the patient's chart and labs.  Questions were answered to the patient's satisfaction.     Servando Snare

## 2020-09-14 NOTE — Progress Notes (Signed)
Mobility Specialist: Progress Note   09/14/20 1741  Mobility  Activity Dangled on edge of bed  Level of Assistance Minimal assist, patient does 75% or more  Assistive Device None  Mobility Sit up in bed/chair position for meals  Mobility Response Tolerated well  Mobility performed by Mobility specialist  $Mobility charge 1 Mobility   Post-Mobility: 79 HR, 162/61 BP, 100% SpO2  Pt had no c/o during transfer to EOB. Pt was hand held minA to transfer from supine to sit EOB. Pt back in bed with call bell at her side.   [facility] Christy Zhang Mobility Specialist Mobility Specialist Phone: [phone]

## 2020-09-14 NOTE — Progress Notes (Signed)
Patient unable to urinate at this time.  Unable to collect urine for UA.  Dr. Donzetta Matters notified.  Ok to proceed with surgery without UA.

## 2020-09-14 NOTE — Anesthesia Procedure Notes (Signed)
Anesthesia Regional Block: Adductor canal block   Pre-Anesthetic Checklist: ,, timeout performed, Correct Patient, Correct Site, Correct Laterality, Correct Procedure, Correct Position, site marked, Risks and benefits discussed,  Surgical consent,  Pre-op evaluation,  At surgeon's request and post-op pain management  Laterality: Right  Prep: chloraprep       Needles:  Injection technique: Single-shot  Needle Type: Echogenic Needle     Needle Length: 9cm  Needle Gauge: 21     Additional Needles:   Procedures:,,,, ultrasound used (permanent image in chart),,,,  Narrative:  Start time: 09/14/2020 8:56 AM End time: 09/14/2020 9:00 AM Injection made incrementally with aspirations every 5 mL.  Performed by: Personally  Anesthesiologist: Suzette Battiest, MD

## 2020-09-14 NOTE — Anesthesia Procedure Notes (Signed)
Anesthesia Regional Block: Popliteal block   Pre-Anesthetic Checklist: ,, timeout performed, Correct Patient, Correct Site, Correct Laterality, Correct Procedure, Correct Position, site marked, Risks and benefits discussed,  Surgical consent,  Pre-op evaluation,  At surgeon's request and post-op pain management  Laterality: Right  Prep: chloraprep       Needles:  Injection technique: Single-shot  Needle Type: Echogenic Needle     Needle Length: 9cm  Needle Gauge: 21     Additional Needles:   Procedures:, nerve stimulator,,, ultrasound used (permanent image in chart),,,,   Nerve Stimulator or Paresthesia:  Response: plantar flexion, 0.6 mA,   Additional Responses:   Narrative:  Start time: 09/14/2020 8:50 AM End time: 09/14/2020 8:56 AM Injection made incrementally with aspirations every 5 mL.  Performed by: Personally  Anesthesiologist: Suzette Battiest, MD

## 2020-09-14 NOTE — Consult Note (Signed)
[facility] Renal Consultation Note    Indication for Consultation:  Management of ESRD/hemodialysis; anemia, hypertension/volume and secondary hyperparathyroidism  IEP:PIRJJO, [name], MD  HPI: Christy Zhang is a 65 y.o. female. ESRD on HD MWF at [facility].  Past medical history significant for DMT2, HTN, chronic pain and PAD.   Patient admitted today for planned BKA due to non healing TMA. Seen and examined at bedside in room s/p BKA by Dr. Donzetta Matters.  Pain currently well controlled.  Completed last HD on Wednesday, tolerated well and met her dry weight.  Denies CP, SOB, orthopnea, n/v/d, abdominal pain, weakness and fatigue.  Patient has been admitted for further evaluation and management.   Past Medical History:  Diagnosis Date  . Anemia   . Arthritis   . Asthma    in the past  . CHF (congestive heart failure) ([place])   . Chronic kidney disease    Renal failure, dialysis on M/W/F  . 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 End stage renal disease ([place])   . End stage renal disease on dialysis ([place])   . Gangrene ([place])   . Hemodialysis access, arteriovenous graft ([place])   . Hyperglycemia due to diabetes mellitus ([place]) 10/15/2019  . Hyperlipidemia   . Hypertension   . Neuromuscular disorder [facility])    Past Surgical History:  Procedure Laterality Date  .  LEFT BELOW KNEE AMPUTATION (Left Knee)  06/08/2020  . A/V FISTULAGRAM Right 02/04/2017   Procedure: A/V Fistulagram;  Surgeon: Waynetta Sandy, MD;  Location: [place] [facility];  Service: Cardiovascular;  Laterality: Right;  . ABDOMINAL AORTOGRAM W/LOWER EXTREMITY N/A 11/18/2019   Procedure: ABDOMINAL AORTOGRAM W/LOWER EXTREMITY;  Surgeon: [name], MD;  Location: [place] [facility];  Service: Cardiovascular;  Laterality: N/A;  . ABDOMINAL AORTOGRAM W/LOWER EXTREMITY N/A 02/23/2020   Procedure: ABDOMINAL AORTOGRAM W/LOWER EXTREMITY;  Surgeon: [name], MD;  Location: [place] [facility];  Service: Cardiovascular;  Laterality: N/A;  Bilateral  . ABDOMINAL AORTOGRAM W/LOWER EXTREMITY N/A 08/10/2020   Procedure: ABDOMINAL AORTOGRAM W/LOWER EXTREMITY;  Surgeon: [name], MD;  Location: [place] [facility];  Service: Cardiovascular;  Laterality: N/A;  . AMPUTATION Left 06/08/2020   Procedure: LEFT BELOW KNEE AMPUTATION;  Surgeon: Cherre Robins, MD;  Location: [place];  Service: Vascular;  Laterality: Left;  . AV FISTULA PLACEMENT Right 08/25/2014   Procedure: RIGHT BRACHIOCEPHALIC ARTERIOVENOUS (AV) FISTULA CREATION;  Surgeon: Mal Misty, MD;  Location: [place];  Service: Vascular;  Laterality: Right;  . AV FISTULA PLACEMENT Right 03/18/2019   Procedure: INSERTION OF ARTERIOVENOUS (AV) GORE-TEX GRAFT RIGHT ARM;  Surgeon: Serafina Mitchell, MD;  Location: [place];  Service: Vascular;  Laterality: Right;  . BASCILIC VEIN TRANSPOSITION Right 12/24/2018   Procedure: FIRST STAGE BASILIC VEIN TRANSPOSITION RIGHT UPPER ARM;  Surgeon: Serafina Mitchell, MD;  Location: [place];  Service: Vascular;  Laterality: Right;  . FISTULA SUPERFICIALIZATION Right 11/03/2014   Procedure: RIGHT UPPER ARM FISTULA SUPERFICIALIZATION;  Surgeon: Mal Misty, MD;  Location: [place];  Service: Vascular;  Laterality: Right;  . FISTULOGRAM Right 01/16/2016   Procedure: RIGHT UPPER EXTREMITY ARTERIOVENOUS FISTULOGRAM WITH BALLOON ANGIOPLASTY;  Surgeon: Vickie Epley, MD;  Location: [facility];  Service: Vascular;  Laterality: Right;  . INSERTION OF DIALYSIS CATHETER     X4  . IR FLUORO GUIDE CV LINE RIGHT  12/03/2018  . IR REMOVAL TUN CV CATH W/O  [facility]  07/06/2019  . IR THROMBECTOMY AV FISTULA W/THROMBOLYSIS/PTA INC/SHUNT/IMG RIGHT Right 12/03/2018  . IR THROMBECTOMY AV FISTULA W/THROMBOLYSIS/PTA/STENT INC/SHUNT/IMG RT Right 10/11/2019  . IR US GUIDE VASC ACCESS RIGHT  12/03/2018  . IR US GUIDE VASC ACCESS RIGHT  12/03/2018  . IR US GUIDE VASC ACCESS RIGHT  10/14/2019  . LIGATION OF COMPETING BRANCHES OF  ARTERIOVENOUS FISTULA Right 11/03/2014   Procedure: LIGATION OF COMPETING BRANCHE x1 OF RIGHT UPPER ARM ARTERIOVENOUS FISTULA;  Surgeon: Mal Misty, MD;  Location: [place];  Service: Vascular;  Laterality: Right;  . PERIPHERAL VASCULAR BALLOON ANGIOPLASTY Right 02/04/2017   Procedure: PERIPHERAL VASCULAR BALLOON ANGIOPLASTY;  Surgeon: Waynetta Sandy, MD;  Location: [place] [facility];  Service: Cardiovascular;  Laterality: Right;  . PERIPHERAL VASCULAR BALLOON ANGIOPLASTY Right 11/18/2019   Procedure: PERIPHERAL VASCULAR BALLOON ANGIOPLASTY;  Surgeon: [name], MD;  Location: [place] [facility];  Service: Cardiovascular;  Laterality: Right;  anterior tibial  . PERIPHERAL VASCULAR BALLOON ANGIOPLASTY  02/23/2020   Procedure: PERIPHERAL VASCULAR BALLOON ANGIOPLASTY;  Surgeon: [name], MD;  Location: [place] [facility];  Service: Cardiovascular;;  Lt  AT  . PERIPHERAL VASCULAR INTERVENTION Right 08/10/2020   Procedure: PERIPHERAL VASCULAR INTERVENTION;  Surgeon: [name], MD;  Location: [place] [facility];  Service: Cardiovascular;  Laterality: Right;  . TRANSMETATARSAL AMPUTATION Left 02/24/2020   Procedure: TRANSMETATARSAL AMPUTATION LEFT;  Surgeon: Cherre Robins, MD;  Location: [place];  Service: Vascular;  Laterality: Left;  . TRANSMETATARSAL AMPUTATION Right 08/11/2020   Procedure: RIGHT TRANSMETATARSAL AMPUTATION;  Surgeon: Waynetta Sandy, MD;  Location: [place];  Service: Vascular;  Laterality: Right;  . TUBAL LIGATION     Family History  Problem Relation Age of Onset  . Diabetes Mother   . Hypertension Mother   . Cancer Father   . Diabetes Father   . Hypertension Sister   . Diabetes Daughter   . Heart disease Daughter   . Hypertension Daughter   . Diabetes Son   . Heart disease Son   . Hypertension Son   . Peripheral vascular disease Son        amputation   Social History:  reports that she quit smoking about 36 years ago.  Her smoking use included cigarettes. She has never used smokeless tobacco. She reports that she does not drink alcohol and does not use drugs. Allergies  Allergen Reactions  . Sulfa Antibiotics Swelling   Prior to Admission medications   Medication Sig Start Date End Date Taking? Authorizing Provider  acetaminophen (TYLENOL) 325 MG tablet Take 1-2 tablets (325-650 mg total) by mouth every 4 (four) hours as needed for mild pain. 07/04/20  Yes Love, Ivan Anchors, PA-C  amLODipine (NORVASC) 5 MG tablet TAKE ONE TABLET BY MOUTH AT BEDTIME 09/06/20  Yes [name], [name], MD  aspirin EC 81 MG tablet Take 81 mg by mouth at bedtime. Swallow whole.   Yes [provider]  atorvastatin (LIPITOR) 40 MG tablet TAKE ONE TABLET BY MOUTH DAILY Patient taking differently: at bedtime. 08/21/20  Yes Claretta Fraise, MD  diphenhydrAMINE (BENADRYL) 25 MG tablet Take 25 mg by mouth every 6 (six) hours as needed for itching.   Yes [provider]  gabapentin (NEURONTIN) 300 MG capsule Take 1 capsule (300 mg total) by mouth at bedtime. 07/27/20  Yes Bayard Hugger, NP  insulin detemir (LEVEMIR) 100 UNIT/ML injection Inject 0.16 mLs (16 Units total) into the skin daily. 08/31/20  Yes  Claretta Fraise, MD  metoprolol succinate (TOPROL-XL) 50 MG 24 hr tablet TAKE ONE TABLET BY MOUTH DAILY. TAKE WITH OR IMMEDIATELY FOLLOWING A MEAL Patient taking differently: at bedtime. 09/06/20  Yes [name], [name], MD  multivitamin (RENA-VIT) TABS tablet Take 1 tablet by mouth at bedtime.    Yes [provider]  oxyCODONE (OXY IR/ROXICODONE) 5 MG immediate release tablet Take 1 tablet (5 mg total) by mouth every 6 (six) hours as needed for severe pain. 08/18/20  Yes Florencia Reasons, MD  senna-docusate (SENOKOT-S) 8.6-50 MG tablet Take 1 tablet by mouth 2 (two) times daily. 07/04/20  Yes Love, Ivan Anchors, PA-C  sevelamer carbonate (RENVELA) 800 MG tablet Take 1,600-2,400 mg by mouth See admin instructions. Take 2400 mg with each meal and  1600 mg with each snack   Yes [provider]  traMADol (ULTRAM) 50 MG tablet Take 1 tablet (50 mg total) by mouth every 12 (twelve) hours as needed for moderate pain. 07/27/20  Yes Bayard Hugger, NP  TRULICITY 1.5 WE/3.1VQ SOPN INJECT CONTENTs OF ONE PEN UNDER THE SKIN WEEKLY ON SATURDAY 08/24/20  Yes Claretta Fraise, MD  clopidogrel (PLAVIX) 75 MG tablet Take 1 tablet (75 mg total) by mouth daily. Patient taking differently: No sig reported 11/18/19 11/17/20  [name], MD  Insulin Syringe-Needle U-100 28G X 1/2" 1 ML MISC Use with insulin daily Dx E11.22 08/31/20   Claretta Fraise, MD  polyethylene glycol (MIRALAX / GLYCOLAX) 17 g packet Take 17 g by mouth daily. 06/17/20   Regalado, Jerald Kief A, MD   No current facility-administered medications for this encounter.   Labs: Basic Metabolic Panel: Recent Labs  Lab 09/14/20 0731  NA 134*  K 4.3  CL 96*  CO2 29  GLUCOSE 99  BUN 18  CREATININE 7.38*  CALCIUM 8.5*   Liver Function Tests: Recent Labs  Lab 09/14/20 0731  AST 13*  ALT 9  ALKPHOS 48  BILITOT 0.8  PROT 7.5  ALBUMIN 2.6*   No results for input(s): LIPASE, AMYLASE in the last 168 hours. No results for input(s): AMMONIA in the last 168 hours. CBC: Recent Labs  Lab 09/14/20 0731  WBC 13.5*  HGB 9.9*  HCT 32.7*  MCV 83.6  PLT 486*   Cardiac Enzymes: No results for input(s): CKTOTAL, CKMB, CKMBINDEX, TROPONINI in the last 168 hours. CBG: Recent Labs  Lab 09/14/20 0647 09/14/20 0906 09/14/20 1030  GLUCAP 76 72 105*   Iron Studies: No results for input(s): IRON, TIBC, TRANSFERRIN, FERRITIN in the last 72 hours. Studies/Results: No results found.  ROS: All others negative except those listed in HPI.  General: No weight loss, fever, chills  HEENT: No recent headaches, nasal bleeding,  visual changes, sore throat or dysphagia Neurologic: No dizziness, blackouts, seizures. No recent symptoms of stroke or mini- stroke. No recent episodes of  slurred speech, or temporary blindness.  Cardiac: No recent episodes of chest pain/pressure,  shortness of breath at rest or DOE.  Vascular: No history of rest pain in feet, claudication, nonhealing ulcers  or DVT  Pulmonary: No home oxygen,no cough, hemoptysis, or wheezing  Musculoskeletal: no arthritis, low back pain, or joint pain  Hematologic:No history of hypercoagulable state. No history of easy bleeding. No history of anemia  Gastrointestinal: No hematochezia or melena, No gastroesophageal reflux, no trouble swallowing  Urinary: Anuric or makes small amount of urine, no nurning with urination or frequency   Skin: No rashes or lesions Psychological: No  anxiety or depression  Physical Exam: Vitals:   09/14/20 1226 09/14/20 1241 09/14/20 1326 09/14/20 1349  BP: (!) 151/67 (!) 148/69 (!) 152/67 (!) 150/70  Pulse: 70 72 77 80  Resp: 14 13 (!) 21 15  Temp:   (!) 97.4 F (36.3 C) 98 F (36.7 C)  TempSrc:    Oral  SpO2: 100% 100% 100% 100%  Weight:      Height:         General: Well appearing obese female in NAD Head: NCAT sclera not icteric MMM Neck: Supple. No lymphadenopathy Lungs: CTA bilaterally. No wheeze, rales or rhonchi. Breathing is unlabored. Heart: RRR. No murmur, rubs or gallops.  Abdomen: soft, nontender, +BS, no guarding, no rebound tenderness Lower extremities:b/l BKA, no stump edema Neuro: AAOx3. Moves all extremities spontaneously. Psych:  Responds to questions appropriately with a normal affect. Dialysis Access:RU AVG  Dialysis Orders:  [facility] - MWF -Requested orders from OP unit   Assessment/Plan: 1. PAD w/ Non healing TMA - s/p BKA today by Dr. Donzetta Matters. Pain well controlled.  2.  ESRD -  HD MWF.  Orders written for HD tomorrow per regular schedule.  No heparin post surgery.  3.  Hypertension/volume  - BP variable, monitor.  Does not appear volume overloaded. UF as tolerated. Will need lower dry on d/c after amputation.  4.  Anemia of CKD - Hgb  9.9.  Follow labs. Requesting records from OP unit. 5.  Secondary Hyperparathyroidism -  Ca in goal. Checking phos. Continue binders. Requesting records for additional meds.  6.  Nutrition - Renal diet w/fluids restrictions.   Jen Mow, PA-C [facility] 09/14/2020, 1:51 PM

## 2020-09-15 ENCOUNTER — Encounter (HOSPITAL_COMMUNITY): Payer: Self-pay | Admitting: Vascular Surgery

## 2020-09-15 ENCOUNTER — Encounter: Payer: Medicare Other | Admitting: Physical Medicine and Rehabilitation

## 2020-09-15 DIAGNOSIS — D631 Anemia in chronic kidney disease: Secondary | ICD-10-CM

## 2020-09-15 DIAGNOSIS — Z992 Dependence on renal dialysis: Secondary | ICD-10-CM

## 2020-09-15 DIAGNOSIS — N186 End stage renal disease: Secondary | ICD-10-CM

## 2020-09-15 DIAGNOSIS — Z89511 Acquired absence of right leg below knee: Secondary | ICD-10-CM | POA: Diagnosis not present

## 2020-09-15 LAB — CBC
HCT: 31.7 % — ABNORMAL LOW (ref 36.0–46.0)
HCT: 33.8 % — ABNORMAL LOW (ref 36.0–46.0)
Hemoglobin: 9.6 g/dL — ABNORMAL LOW (ref 12.0–15.0)
Hemoglobin: 10.4 g/dL — ABNORMAL LOW (ref 12.0–15.0)
MCH: 25.1 pg — ABNORMAL LOW (ref 26.0–34.0)
MCH: 25.3 pg — ABNORMAL LOW (ref 26.0–34.0)
MCHC: 30.3 g/dL (ref 30.0–36.0)
MCHC: 30.8 g/dL (ref 30.0–36.0)
MCV: 82.8 fL (ref 80.0–100.0)
MCV: 82.2 fL (ref 80.0–100.0)
Platelets: 443 10*3/uL — ABNORMAL HIGH (ref 150–400)
Platelets: 572 10*3/uL — ABNORMAL HIGH (ref 150–400)
RBC: 3.83 MIL/uL — ABNORMAL LOW (ref 3.87–5.11)
RBC: 4.11 MIL/uL (ref 3.87–5.11)
RDW: 17.1 % — ABNORMAL HIGH (ref 11.5–15.5)
RDW: 17 % — ABNORMAL HIGH (ref 11.5–15.5)
WBC: 17.6 10*3/uL — ABNORMAL HIGH (ref 4.0–10.5)
WBC: 21.9 10*3/uL — ABNORMAL HIGH (ref 4.0–10.5)
nRBC: 0 % (ref 0.0–0.2)
nRBC: 0 % (ref 0.0–0.2)

## 2020-09-15 LAB — RENAL FUNCTION PANEL
Albumin: 2.5 g/dL — ABNORMAL LOW (ref 3.5–5.0)
Albumin: 2.9 g/dL — ABNORMAL LOW (ref 3.5–5.0)
Anion gap: 14 (ref 5–15)
Anion gap: 11 (ref 5–15)
BUN: 33 mg/dL — ABNORMAL HIGH (ref 8–23)
BUN: 37 mg/dL — ABNORMAL HIGH (ref 8–23)
CO2: 25 mmol/L (ref 22–32)
CO2: 26 mmol/L (ref 22–32)
Calcium: 8.3 mg/dL — ABNORMAL LOW (ref 8.9–10.3)
Calcium: 9 mg/dL (ref 8.9–10.3)
Chloride: 94 mmol/L — ABNORMAL LOW (ref 98–111)
Chloride: 95 mmol/L — ABNORMAL LOW (ref 98–111)
Creatinine, Ser: 8.29 mg/dL — ABNORMAL HIGH (ref 0.44–1.00)
Creatinine, Ser: 8.58 mg/dL — ABNORMAL HIGH (ref 0.44–1.00)
GFR, Estimated: 5 mL/min — ABNORMAL LOW (ref >60)
GFR, Estimated: 5 mL/min — ABNORMAL LOW (ref >60)
Glucose, Bld: 169 mg/dL — ABNORMAL HIGH (ref 70–99)
Glucose, Bld: 198 mg/dL — ABNORMAL HIGH (ref 70–99)
Phosphorus: 6 mg/dL — ABNORMAL HIGH (ref 2.5–4.6)
Phosphorus: 6.4 mg/dL — ABNORMAL HIGH (ref 2.5–4.6)
Potassium: 5.2 mmol/L — ABNORMAL HIGH (ref 3.5–5.1)
Potassium: 4.9 mmol/L (ref 3.5–5.1)
Sodium: 133 mmol/L — ABNORMAL LOW (ref 135–145)
Sodium: 132 mmol/L — ABNORMAL LOW (ref 135–145)

## 2020-09-15 LAB — GLUCOSE, CAPILLARY
Glucose-Capillary: 200 mg/dL — ABNORMAL HIGH (ref 70–99)
Glucose-Capillary: 173 mg/dL — ABNORMAL HIGH (ref 70–99)
Glucose-Capillary: 141 mg/dL — ABNORMAL HIGH (ref 70–99)
Glucose-Capillary: 170 mg/dL — ABNORMAL HIGH (ref 70–99)

## 2020-09-15 LAB — HEPATITIS B SURFACE ANTIBODY, QUANTITATIVE: Hep B S AB Quant (Post): <3.1 m[IU]/mL — ABNORMAL LOW (ref >9.9)

## 2020-09-15 MED ORDER — ALTEPLASE 2 MG IJ SOLR: 2.0000 mg | Freq: Once | INTRAMUSCULAR | Status: DC | PRN

## 2020-09-15 MED ORDER — LIDOCAINE-PRILOCAINE 2.5-2.5 % EX CREA
1.0000 "application " | TOPICAL_CREAM | CUTANEOUS | Status: DC | PRN
  Filled 2020-09-15: qty 5

## 2020-09-15 MED ORDER — SODIUM CHLORIDE 0.9 % IV SOLN: 100.0000 mL | INTRAVENOUS | Status: DC | PRN

## 2020-09-15 MED ORDER — PENTAFLUOROPROP-TETRAFLUOROETH EX AERO: 1.0000 "application " | INHALATION_SPRAY | CUTANEOUS | Status: DC | PRN

## 2020-09-15 MED ORDER — HEPARIN SODIUM (PORCINE) 1000 UNIT/ML DIALYSIS
1000.0000 [IU] | INTRAMUSCULAR | Status: DC | PRN
  Filled 2020-09-15: qty 1

## 2020-09-15 MED ORDER — LIDOCAINE HCL (PF) 1 % IJ SOLN: 5.0000 mL | INTRAMUSCULAR | Status: DC | PRN

## 2020-09-15 NOTE — Consult Note (Signed)
[place] Nurse Consult Note: Patient receiving care in [place] Reason for Consult: Status post right BKA BKA surgery completed yesterday 09/14/20 by Servando Snare MD, VVS. WOC will not follow this patient unless requested by VVS. Any questions should be directed to the VVS group.   Cathlean Marseilles Tamala Julian, MSN, RN, [place], [place]le [place], [facility] Treatment Associate Pager [phone]

## 2020-09-15 NOTE — Progress Notes (Signed)
  Progress Note    09/15/2020 12:59 PM 1 Day Post-Op  Subjective:  No complaints   Vitals:   09/15/20 0751 09/15/20 1200  BP: (!) 163/44 (!) 159/54  Pulse: 73 76  Resp: 18 18  Temp: 98 F (36.7 C) 98.4 F (36.9 C)  SpO2: 100% 98%    Physical Exam: Incisions:  R groin incision with superficial wound, no obvious infection; BKA dressing left in place   CBC    Component Value Date/Time   WBC 21.9 (H) 09/15/2020 0844   RBC 4.11 09/15/2020 0844   HGB 10.4 (L) 09/15/2020 0844   HGB 9.2 (L) 07/20/2020 1438   HCT 33.8 (L) 09/15/2020 0844   HCT 31.5 (L) 07/20/2020 1438   PLT 572 (H) 09/15/2020 0844   PLT 716 (H) 07/20/2020 1438   MCV 82.2 09/15/2020 0844   MCV 85 07/20/2020 1438   MCH 25.3 (L) 09/15/2020 0844   MCHC 30.8 09/15/2020 0844   RDW 17.0 (H) 09/15/2020 0844   RDW 16.1 (H) 07/20/2020 1438   LYMPHSABS 1.7 08/18/2020 0123   LYMPHSABS 2.0 07/20/2020 1438   MONOABS 1.1 (H) 08/18/2020 0123   EOSABS 1.0 (H) 08/18/2020 0123   EOSABS 0.5 (H) 07/20/2020 1438   BASOSABS 0.0 08/18/2020 0123   BASOSABS 0.1 07/20/2020 1438    BMET    Component Value Date/Time   NA 132 (L) 09/15/2020 0843   NA 139 07/20/2020 1438   K 4.9 09/15/2020 0843   CL 95 (L) 09/15/2020 0843   CO2 26 09/15/2020 0843   GLUCOSE 198 (H) 09/15/2020 0843   BUN 37 (H) 09/15/2020 0843   BUN 24 07/20/2020 1438   CREATININE 8.58 (H) 09/15/2020 0843   CALCIUM 9.0 09/15/2020 0843   GFRNONAA 5 (L) 09/15/2020 0843   GFRAA 8 (L) 04/06/2020 1113    INR    Component Value Date/Time   INR 1.1 09/14/2020 0731     Intake/Output Summary (Last 24 hours) at 09/15/2020 1259 Last data filed at 09/14/2020 2100 Gross per 24 hour  Intake 120 ml  Output --  Net 120 ml     Assessment/Plan:  65 y.o. female is s/p right below knee amputation  1 Day Post-Op  -R BKA dressing change tomorrow -dry gauze to R groin wound; no tape -PT/OT eval pending   Dagoberto Ligas, PA-C [facility] [phone] 09/15/2020 12:59 PM

## 2020-09-15 NOTE — Progress Notes (Signed)
PROGRESS NOTE                                                                             PROGRESS NOTE                                                                                                                                                                                                             Patient Demographics:    Christy Zhang, is a 65 y.o. female, DOB - [birth year], [id number]  Outpatient Primary MD for the patient is Claretta Fraise, MD    LOS - 1  Admit date - 09/14/2020    No chief complaint on file.      Brief Narrative    RIANSHI Zhang is a 65 y.o. female with medical history significant of ESRD on HD(Monday, Wednesday, Friday),  DM type II, PAD with prior right TMA with wound VAC on 08/11/2020 by Dr. Donzetta Matters who presenting for nonhealing right transmetatarsal amputation.  She had followed up in office on 5/10 for postop wound check and wound VAC changes noted to have purulent drainage.  Patient was taken to the operating room today and underwent right BKA by Dr. Donzetta Matters with only minimal blood loss reported.  Patient had gone to hemodialysis last yesterday.  Denies any complaints of shortness of breath, chest pain, orthopnea, nausea, vomiting, or fevers.   Subjective:    Christy Zhang today denies any fever, chills, she denies any significant pain.   Assessment  & Plan :    Principal Problem:   S/P BKA (below knee amputation) unilateral, right (HCC) Active Problems:   Controlled type 2 diabetes mellitus with complication, with long-term current use of insulin (HCC)   Anemia in chronic kidney disease   Thrombocytosis   ESRD on hemodialysis (HCC)   PVD (peripheral vascular disease) (HCC)  S/P BKA (below knee amputation) unilateral, right:  - Patient had a nonhealing transmetatarsal amputation of the right foot for which she underwent an below-knee amputation on the right today with Dr. Donzetta Matters. -Tramadol/oxycodone as  needed for pain -Transitions of care consulted  ESRD on HD: - Patient normally dialyzes on Monday, Wednesday, Friday and had her regular hemodialysis on 5/18.  Patient does not appear to be fluid overloaded. -HD per nephrology.   Diabetes mellitus type 2:  - Patient appears to be relatively well controlled.  Last hemoglobin A1c was 6.5 on 08/09/2020.  Home medications include Trulicity 1.5 mg q. Saturday, and Levemir 16 units q. at bedtime daily -Hypoglycemic protocols -CBGs before every meal  with a very sensitive SSI -Continue home Levemir 16 units.  Anemia of chronic disease: Hemoglobin 9.9 g/dL.  Patient appears to be hemodynamic stable following her procedure. -Check CBC tomorrow morning.  Essential hypertension -Continue metoprolol and amlodipine -Monitor for the possibility of hypotension following amputation and adjust medications as needed  Peripheral vascular disease -Continue statin and restart Plavix tomorrow  Thrombocytosis: Acute.  Likely reactive in nature. -Continue to monitor  Hyperlipidemia -Continue atorvastatin    SpO2: 98 % O2 Flow Rate (L/min): 2 L/min  Recent Labs  Lab 09/13/20 1429 09/14/20 0731 09/15/20 0159 09/15/20 0843 09/15/20 0844  WBC  --  13.5* 17.6*  --  21.9*  PLT  --  486* 443*  --  572*  AST  --  13*  --   --   --   ALT  --  9  --   --   --   ALKPHOS  --  48  --   --   --   BILITOT  --  0.8  --   --   --   ALBUMIN  --  2.6* 2.5* 2.9*  --   INR  --  1.1  --   --   --   SARSCOV2NAA NEGATIVE  --   --   --   --        ABG     Component Value Date/Time   TCO2 28 06/08/2020 0812          Condition - Extremely Guarded  Family Communication  :  None at bedside  Code Status :  Full  Consults  :  Vascular, renal  Procedures  :  Right below-knee amputation  Disposition Plan  :    Status is: Inpatient  Remains inpatient appropriate because:Ongoing diagnostic testing needed not appropriate for outpatient  work up   Dispo: The patient is from: Home              Anticipated d/c is to: Home              Patient currently is not medically stable to d/c.   Difficult to place patient No      DVT Prophylaxis  :  Heparin   Lab Results  Component Value Date   PLT 572 (H) 09/15/2020    Diet :  Diet Order            Diet renal/carb modified with fluid restriction Diet-HS Snack? Nothing; Fluid restriction: 1200 mL Fluid; Room service appropriate? Yes; Fluid consistency: Thin  Diet effective now                  Inpatient Medications  Scheduled Meds: . amLODipine  5 mg Oral QHS  . aspirin EC  81 mg Oral QHS  . atorvastatin  40 mg Oral QHS  . Chlorhexidine Gluconate Cloth  6 each Topical Q0600  . clopidogrel  75 mg Oral QPM  . gabapentin  300 mg Oral QHS  . heparin  5,000 Units Subcutaneous Q8H  . insulin aspart  0-6 Units Subcutaneous TID WC  . insulin detemir  16 Units Subcutaneous Daily  . metoprolol succinate  50 mg Oral QHS  . multivitamin  1 tablet Oral QHS  . polyethylene glycol  17 g Oral Daily  . senna-docusate  1 tablet Oral BID  . sevelamer carbonate  1,600 mg Oral With snacks  . sevelamer carbonate  2,400 mg Oral TID WC  . sodium chloride flush  3 mL Intravenous Q12H   Continuous Infusions: . sodium chloride    . sodium chloride     PRN Meds:.sodium chloride, sodium chloride, acetaminophen **OR** acetaminophen, albuterol, alteplase, heparin, lidocaine (PF), lidocaine-prilocaine, ondansetron **OR** ondansetron (ZOFRAN) IV, oxyCODONE, pentafluoroprop-tetrafluoroeth, traMADol  Antibiotics  :    Anti-infectives (From admission, onward)   Start     Dose/Rate Route Frequency Ordered Stop   09/14/20 0642  ceFAZolin (ANCEF) IVPB 2g/100 mL premix        2 g 200 mL/hr over 30 Minutes Intravenous 30 min pre-op 09/14/20 [id number] 09/14/20 0930       Christy Zhang M.D on 09/15/2020 at 5:31 PM  To page go to [email or web address]   [facility] -  Office   [phone]    Objective:   Vitals:   09/15/20 1600 09/15/20 1630 09/15/20 1700 09/15/20 1730  BP: (!) 106/34 (!) 105/31 (!) 105/25 115/79  Pulse: 69 70 68 (!) 103  Resp:      Temp:      TempSrc:      SpO2:      Weight:      Height:        Wt Readings from Last 3 Encounters:  09/15/20 95.5 kg  09/05/20 97.5 kg  08/25/20 98 kg     Intake/Output Summary (Last 24 hours) at 09/15/2020 1731 Last data filed at 09/14/2020 2100 Gross per 24 hour  Intake 120 ml  Output --  Net 120 ml     Physical Exam  Awake Alert, No new F.N deficits, Normal affect Symmetrical Chest wall movement, Good air movement bilaterally, CTAB RRR,No Gallops,Rubs or new Murmurs, No Parasternal Heave +ve B.Sounds, Abd Soft, No tenderness, No rebound - guarding or rigidity. Bilateral BKA, bilateral BKA bandaged with Ace wrap    Data Review:    CBC Recent Labs  Lab 09/14/20 0731 09/15/20 0159 09/15/20 0844  WBC 13.5* 17.6* 21.9*  HGB 9.9* 9.6* 10.4*  HCT 32.7* 31.7* 33.8*  PLT 486* 443* 572*  MCV 83.6 82.8 82.2  MCH 25.3* 25.1* 25.3*  MCHC 30.3 30.3 30.8  RDW 17.4* 17.1* 17.0*    Recent Labs  Lab 09/14/20 0731 09/15/20 0159 09/15/20 0843  NA 134* 133* 132*  K 4.3 5.2* 4.9  CL 96* 94* 95*  CO2 '29 25 26  '$ GLUCOSE 99 169* 198*  BUN 18 33* 37*  CREATININE 7.38* 8.29* 8.58*  CALCIUM 8.5* 8.3* 9.0  AST 13*  --   --   ALT 9  --   --   ALKPHOS 48  --   --   BILITOT 0.8  --   --   ALBUMIN 2.6* 2.5* 2.9*  INR 1.1  --   --     ------------------------------------------------------------------------------------------------------------------ No results for input(s): CHOL, HDL, LDLCALC, TRIG, CHOLHDL, LDLDIRECT in the last 72 hours.  Lab Results  Component Value Date   HGBA1C 6.5 (H) 08/09/2020   ------------------------------------------------------------------------------------------------------------------ No results for input(s): TSH, T4TOTAL, T3FREE, THYROIDAB in the last 72  hours.  Invalid  input(s): FREET3  Cardiac Enzymes No results for input(s): CKMB, TROPONINI, MYOGLOBIN in the last 168 hours.  Invalid input(s): CK ------------------------------------------------------------------------------------------------------------------ No results found for: BNP  Micro Results Recent Results (from the past 240 hour(s))  SARS CORONAVIRUS 2 (TAT 6-24 HRS) Nasopharyngeal Nasopharyngeal Swab     Status: None   Collection Time: 09/13/20  2:29 PM   Specimen: Nasopharyngeal Swab  Result Value Ref Range Status   SARS Coronavirus 2 NEGATIVE NEGATIVE Final    Comment: (NOTE) SARS-CoV-2 target nucleic acids are NOT DETECTED.  The SARS-CoV-2 RNA is generally detectable in upper and lower respiratory specimens during the acute phase of infection. Negative results do not preclude SARS-CoV-2 infection, do not rule out co-infections with other pathogens, and should not be used as the sole basis for treatment or other patient management decisions. Negative results must be combined with clinical observations, patient history, and epidemiological information. The expected result is Negative.  Fact Sheet for Patients: SugarRoll.be  Fact Sheet for Healthcare Providers: https://www.woods-mathews.com/  This test is not yet approved or cleared by the [place] FDA and  has been authorized for detection and/or diagnosis of SARS-CoV-2 by FDA under an Emergency Use Authorization (EUA). This EUA will remain  in effect (meaning this test can be used) for the duration of the COVID-19 declaration under Se ction 564(b)(1) of the Act, 21 U.S.C. section 360bbb-3(b)(1), unless the authorization is terminated or revoked sooner.  Performed at [facility], [place] [facility] [address] [place], [place] [address]     Radiology Reports No results found.

## 2020-09-15 NOTE — Progress Notes (Addendum)
[facility] Progress Note   Subjective: Seen in room. Says she is having some numbness of fingers distal to AVG toward end of treatment. Says it started about 2 months ago. Able to use R hand + pulses. Try UFP 2 with HD. If no improvement, will consult VVS.     Objective Vitals:   09/14/20 2000 09/14/20 2340 09/15/20 0418 09/15/20 0751  BP: (!) 141/29 (!) 137/31 (!) 141/46 (!) 163/44  Pulse: 84 85 73 73  Resp: '19 18 16 18  '$ Temp: 99 F (37.2 C) 99.1 F (37.3 C) 99.1 F (37.3 C) 98 F (36.7 C)  TempSrc: Oral Oral Oral Oral  SpO2: 100% 100% 95% 100%  Weight:   96.9 kg   Height:       Physical Exam General: Very pleasant older female in NAD Heart: S1,S2 SR on monitor. 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  Dialysis Access: R AVG + T/B. R Index, middle and ring finger slightly cooler than left hand.     Additional Objective Labs: Basic Metabolic Panel: Recent Labs  Lab 09/14/20 0731 09/15/20 0159 09/15/20 0843  NA 134* 133* 132*  K 4.3 5.2* 4.9  CL 96* 94* 95*  CO2 '29 25 26  '$ GLUCOSE 99 169* 198*  BUN 18 33* 37*  CREATININE 7.38* 8.29* 8.58*  CALCIUM 8.5* 8.3* 9.0  PHOS  --  6.0* 6.4*   Liver Function Tests: Recent Labs  Lab 09/14/20 0731 09/15/20 0159 09/15/20 0843  AST 13*  --   --   ALT 9  --   --   ALKPHOS 48  --   --   BILITOT 0.8  --   --   PROT 7.5  --   --   ALBUMIN 2.6* 2.5* 2.9*   No results for input(s): LIPASE, AMYLASE in the last 168 hours. CBC: Recent Labs  Lab 09/14/20 0731 09/15/20 0159 09/15/20 0844  WBC 13.5* 17.6* 21.9*  HGB 9.9* 9.6* 10.4*  HCT 32.7* 31.7* 33.8*  MCV 83.6 82.8 82.2  PLT 486* 443* 572*   Blood Culture    Component Value Date/Time   SDES BLOOD LEFT HAND 08/16/2020 1804   SPECREQUEST  08/16/2020 1804    BOTTLES DRAWN AEROBIC ONLY Blood Culture adequate volume   CULT  08/16/2020 1804    NO GROWTH 5 DAYS Performed at [facility], [place] [address] [place], [place] [address]    REPTSTATUS 08/21/2020 FINAL 08/16/2020 1804    Cardiac Enzymes: No results for input(s): CKTOTAL, CKMB, CKMBINDEX, TROPONINI in the last 168 hours. CBG: Recent Labs  Lab 09/14/20 0906 09/14/20 1030 09/14/20 1555 09/14/20 2123 09/15/20 0616  GLUCAP 72 105* 159* 252* 200*   Iron Studies: No results for input(s): IRON, TIBC, TRANSFERRIN, FERRITIN in the last 72 hours. '@lablastinr3'$ @ Studies/Results: No results found. Medications: . sodium chloride    . sodium chloride     . amLODipine  5 mg Oral QHS  . aspirin EC  81 mg Oral QHS  . atorvastatin  40 mg Oral QHS  . Chlorhexidine Gluconate Cloth  6 each Topical Q0600  . clopidogrel  75 mg Oral QPM  . gabapentin  300 mg Oral QHS  . heparin  5,000 Units Subcutaneous Q8H  . insulin aspart  0-6 Units Subcutaneous TID WC  . insulin detemir  16 Units Subcutaneous Daily  . metoprolol succinate  50 mg Oral QHS  . multivitamin  1 tablet Oral QHS  .  polyethylene glycol  17 g Oral Daily  . senna-docusate  1 tablet Oral BID  . sevelamer carbonate  1,600 mg Oral With snacks  . sevelamer carbonate  2,400 mg Oral TID WC  . sodium chloride flush  3 mL Intravenous Q12H     Dialysis Orders:  [facility] - MWF -Requested orders from OP unit   Assessment/Plan: 1. PAD w/ Non healing TMA - s/p BKA today by Dr. Donzetta Matters. Pain well controlled.  2.  ESRD -  HD MWF.  Orders written for HD tomorrow per regular schedule.  No heparin post surgery.  3.  Hypertension/volume  - BP variable, monitor.  Does not appear volume overloaded. UF as tolerated. Will need lower dry on d/c after amputation.  4.  Anemia of CKD - 10.4.  Follow labs. Requesting records from OP unit. 5.  Secondary Hyperparathyroidism -  Ca in goal. PO4 slightly elevated. Continue binders. Requesting records for additional meds.  6.  Nutrition - Renal diet w/fluids restrictions.  7. Numbness finger of R hand distal to AVG at end of HD session: Fingers  slightly cooler than L hand. Has full use of R hand. Try changing to UFP 2 first. If no improvement, will notify VVS.   Jimmye Norman. Brown NP-C 09/15/2020, 10:59 AM  [employer] [phone]   I have seen and examined this patient and agree with the plan of care   Sherril Croon 09/15/2020, 1:42 PM

## 2020-09-16 DIAGNOSIS — Z89511 Acquired absence of right leg below knee: Secondary | ICD-10-CM | POA: Diagnosis not present

## 2020-09-16 DIAGNOSIS — E118 Type 2 diabetes mellitus with unspecified complications: Secondary | ICD-10-CM | POA: Diagnosis not present

## 2020-09-16 DIAGNOSIS — D631 Anemia in chronic kidney disease: Secondary | ICD-10-CM | POA: Diagnosis not present

## 2020-09-16 DIAGNOSIS — Z794 Long term (current) use of insulin: Secondary | ICD-10-CM

## 2020-09-16 DIAGNOSIS — N186 End stage renal disease: Secondary | ICD-10-CM | POA: Diagnosis not present

## 2020-09-16 LAB — RENAL FUNCTION PANEL
Albumin: 2.3 g/dL — ABNORMAL LOW (ref 3.5–5.0)
Anion gap: 10 (ref 5–15)
BUN: 18 mg/dL (ref 8–23)
CO2: 28 mmol/L (ref 22–32)
Calcium: 7.9 mg/dL — ABNORMAL LOW (ref 8.9–10.3)
Chloride: 93 mmol/L — ABNORMAL LOW (ref 98–111)
Creatinine, Ser: 5.62 mg/dL — ABNORMAL HIGH (ref 0.44–1.00)
GFR, Estimated: 8 mL/min — ABNORMAL LOW (ref >60)
Glucose, Bld: 124 mg/dL — ABNORMAL HIGH (ref 70–99)
Phosphorus: 5 mg/dL — ABNORMAL HIGH (ref 2.5–4.6)
Potassium: 3.7 mmol/L (ref 3.5–5.1)
Sodium: 131 mmol/L — ABNORMAL LOW (ref 135–145)

## 2020-09-16 LAB — GLUCOSE, CAPILLARY
Glucose-Capillary: 80 mg/dL (ref 70–99)
Glucose-Capillary: 106 mg/dL — ABNORMAL HIGH (ref 70–99)
Glucose-Capillary: 155 mg/dL — ABNORMAL HIGH (ref 70–99)
Glucose-Capillary: 127 mg/dL — ABNORMAL HIGH (ref 70–99)

## 2020-09-16 MED ORDER — CLOPIDOGREL BISULFATE 75 MG PO TABS
75.0000 mg | ORAL_TABLET | Freq: Every evening | ORAL | Status: DC
  Administered 2020-09-17: 75 mg via ORAL
  Filled 2020-09-16: qty 1

## 2020-09-16 NOTE — Progress Notes (Addendum)
[facility] Progress Note   Subjective: HD yesterday. No further steal like symptoms after adding UFP2. No C/Os this AM.   Objective Vitals:   09/15/20 2333 09/16/20 0322 09/16/20 0325 09/16/20 0749  BP: (!) 132/41 (!) 131/39  (!) 129/29  Pulse: 75 70  71  Resp: '18 16  16  '$ Temp: 98.2 F (36.8 C) 98.7 F (37.1 C)  98.5 F (36.9 C)  TempSrc: Oral Oral  Oral  SpO2: 93% 94%  96%  Weight:   94.2 kg   Height:       Physical Exam General: Very pleasant older female in NAD Heart: S1,S2 SR on monitor. Lungs: CTAB Abdomen:S, NT Extremities: Bilateral BKA R BKA with ACE wrap CDI.  Dialysis Access: R AVG + T/B. R hand warm distal to AVG today.   Additional Objective Labs: Basic Metabolic Panel: Recent Labs  Lab 09/15/20 0159 09/15/20 0843 09/16/20 0119  NA 133* 132* 131*  K 5.2* 4.9 3.7  CL 94* 95* 93*  CO2 '25 26 28  '$ GLUCOSE 169* 198* 124*  BUN 33* 37* 18  CREATININE 8.29* 8.58* 5.62*  CALCIUM 8.3* 9.0 7.9*  PHOS 6.0* 6.4* 5.0*   Liver Function Tests: Recent Labs  Lab 09/14/20 0731 09/15/20 0159 09/15/20 0843 09/16/20 0119  AST 13*  --   --   --   ALT 9  --   --   --   ALKPHOS 48  --   --   --   BILITOT 0.8  --   --   --   PROT 7.5  --   --   --   ALBUMIN 2.6* 2.5* 2.9* 2.3*   No results for input(s): LIPASE, AMYLASE in the last 168 hours. CBC: Recent Labs  Lab 09/14/20 0731 09/15/20 0159 09/15/20 0844  WBC 13.5* 17.6* 21.9*  HGB 9.9* 9.6* 10.4*  HCT 32.7* 31.7* 33.8*  MCV 83.6 82.8 82.2  PLT 486* 443* 572*   Blood Culture    Component Value Date/Time   SDES BLOOD LEFT HAND 08/16/2020 1804   SPECREQUEST  08/16/2020 1804    BOTTLES DRAWN AEROBIC ONLY Blood Culture adequate volume   CULT  08/16/2020 1804    NO GROWTH 5 DAYS Performed at [facility], [place] [address], [place], [place] [address]    REPTSTATUS 08/21/2020 FINAL 08/16/2020 1804    Cardiac Enzymes: No results for input(s): CKTOTAL, CKMB, CKMBINDEX, TROPONINI in  the last 168 hours. CBG: Recent Labs  Lab 09/15/20 0616 09/15/20 1215 09/15/20 1823 09/15/20 2129 09/16/20 0611  GLUCAP 200* 173* 141* 170* 80   Iron Studies: No results for input(s): IRON, TIBC, TRANSFERRIN, FERRITIN in the last 72 hours. '@lablastinr3'$ @ Studies/Results: No results found. Medications:  . amLODipine  5 mg Oral QHS  . aspirin EC  81 mg Oral QHS  . atorvastatin  40 mg Oral QHS  . Chlorhexidine Gluconate Cloth  6 each Topical Q0600  . clopidogrel  75 mg Oral QPM  . gabapentin  300 mg Oral QHS  . heparin  5,000 Units Subcutaneous Q8H  . insulin aspart  0-6 Units Subcutaneous TID WC  . insulin detemir  16 Units Subcutaneous Daily  . metoprolol succinate  50 mg Oral QHS  . multivitamin  1 tablet Oral QHS  . polyethylene glycol  17 g Oral Daily  . senna-docusate  1 tablet Oral BID  . sevelamer carbonate  1,600 mg Oral With snacks  . sevelamer carbonate  2,400 mg Oral TID WC  .  sodium chloride flush  3 mL Intravenous Q12H     Dialysis Orders: [facility] - MWF 4 hrs 400/500 97 kg 2.0K/2.5 Ca 16g needles Add UFP 2 on discharge -Heparin 1000 units initial bolus-heparin pump 1000 units/hr stop 1 hour prior to end of tx.  -Epogen 14000 units IV TIW -Hectorol 1.5 mcg IV TIW   Assessment/Plan: 1. PAD w/ Non healing TMA- s/p BKA 09/14/2020 by Dr. Donzetta Matters. Pain well controlled. Some oozing this AM. Redressed per VVS.  2. ESRD- HD MWF. Orders written for HD tomorrow per regular schedule. No heparin post surgery.  3. Hypertension/volume- BP variable, monitor. Does not appear volume overloaded. UF as tolerated. Will need lower dry on d/c after amputation.  4. Anemiaof CKD- 10.4. Follow labs. Requesting records from OP unit. 5. Secondary Hyperparathyroidism -Ca/PO4 at goal.  Continue binders. Renvela 800 mg  3 tabs PO TID AC 2 with snacks.  6. Nutrition- Renal diet w/fluids restrictions. 7. Numbness finger of R hand distal to AVG at end of HD  session: Fingers slightly cooler than L hand. Has full use of R hand. Changed to UFP 2 with HD 09/15/20 with resolution of symptoms.   Rita H. Brown NP-C 09/16/2020, 8:42 AM  [employer] [phone]   Seen and examined independently.  Agree with note and exam as documented above by physician extender and as noted here.  General adult female in bed in no acute distress HEENT normocephalic atraumatic extraocular movements intact sclera anicteric Neck supple trachea midline Lungs clear to auscultation bilaterally normal work of breathing at rest  Heart regular rate and rhythm no rubs or gallops appreciated Abdomen soft nontender nondistended Extremities new right BKA; existing left BKA Psych normal mood and affect Access right arm graft with bruit   ESRD on HD MWF PAD with nonhealing TMA s/p right BKA HTN noted EDW will be lower s/p amputation Anemia CKD - Hb at goal   Claudia Desanctis, MD 09/16/2020  11:45 AM

## 2020-09-16 NOTE — Evaluation (Signed)
Physical Therapy Evaluation Patient Details Name: Christy KWIAT MRN: [id number] DOB: Jul 30, 1955 Today's Date: 09/16/2020   History of Present Illness  Pt adm on 5/19 for rt BKA due to non healing transmetatarsal amputation.  PMH includes L BKA 06/08/20, rt transmet amputation, IDDM, ESRD on HD MWF, HTN, PAD.  Clinical Impression  Pt presents to PT with decr mobility with new rt BKA in addition to lt BKA. Pt able to use sliding board to get into and out of recliner with min assist. Has supportive family and I believe daughter is pt's Engineer, agricultural. Pt also has equipment already at home including w/c, sliding board, and hoyer lift. Recommend pt return home with family and [facility] therapy.     Follow Up Recommendations Home health PT;Supervision for mobility/OOB    Equipment Recommendations  None recommended by PT    Recommendations for Other Services       Precautions / Restrictions Precautions Precautions: Fall Restrictions Weight Bearing Restrictions: Yes RLE Weight Bearing: Non weight bearing LLE Weight Bearing: Non weight bearing      Mobility  Bed Mobility Overal bed mobility: Needs Assistance Bed Mobility: Sit to Supine     Supine to sit: Min assist Sit to supine: Min guard   General bed mobility comments: Assist for safety    Transfers Overall transfer level: Needs assistance Equipment used: Sliding board Transfers: Lateral/Scoot Transfers          Lateral/Scoot Transfers: Min assist;With slide board General transfer comment: Assist using pad to bring hips across board with pt having to come uphill back to bed  Ambulation/Gait                Stairs            Wheelchair Mobility    Modified Rankin (Stroke Patients Only)       Balance Overall balance assessment: Needs assistance Sitting-balance support: No upper extremity supported;Feet unsupported Sitting balance-Leahy Scale: Fair                                        Pertinent Vitals/Pain Pain Assessment: 0-10 Pain Score: 3  Pain Location: incisional Pain Descriptors / Indicators: Tender Pain Intervention(s): Limited activity within patient's tolerance    Home Living Family/patient expects to be discharged to:: Private residence Living Arrangements: Children;Other relatives Available Help at Discharge: Family;Available 24 hours/day (Daughter is pt's Engineer, agricultural) Type of Home: House Home Access: Ramped entrance     [place]: Two level;Able to live on main level with bedroom/bathroom Home Equipment: [place] - 2 wheels;Tub bench;Hand held shower head;Wheelchair - [employer];Hospital bed;Other (comment) (hoyer lift) Additional Comments: sliding board    Prior Function Level of Independence: Needs assistance   Gait / Transfers Assistance Needed: uses w/c for mobility, but was able to stand pivot per patient.  ADL's / Homemaking Assistance Needed: Pt receives assist from daughter for ADLs, daughter is home with her all the time to assist.  She does not participate with meals or home management.  Comments: daughter takes pt to and from dialysis     Hand Dominance   Dominant Hand: Right    Extremity/Trunk Assessment   Upper Extremity Assessment Upper Extremity Assessment: Overall WFL for tasks assessed    Lower Extremity Assessment Lower Extremity Assessment: Defer to PT evaluation    Cervical / Trunk Assessment Cervical / Trunk Assessment: Normal  Communication  Communication: No difficulties  Cognition Arousal/Alertness: Awake/alert Behavior During Therapy: WFL for tasks assessed/performed Overall Cognitive Status: Within Functional Limits for tasks assessed                                        General Comments      Exercises     Assessment/Plan    PT Assessment Patient needs continued PT services  PT Problem List Decreased balance;Decreased mobility       PT Treatment  Interventions DME instruction;Functional mobility training;Therapeutic activities;Therapeutic exercise;Balance training;Patient/family education;Wheelchair mobility training    PT Goals (Current goals can be found in the Care Plan section)  Acute Rehab PT Goals Patient Stated Goal: Hoping to transition home. PT Goal Formulation: With patient Time For Goal Achievement: 09/30/20 Potential to Achieve Goals: Good    Frequency Min 3X/week   Barriers to discharge        Co-evaluation               AM-PAC PT "6 Clicks" Mobility  Outcome Measure Help needed turning from your back to your side while in a flat bed without using bedrails?: None Help needed moving from lying on your back to sitting on the side of a flat bed without using bedrails?: A Little Help needed moving to and from a bed to a chair (including a wheelchair)?: A Little Help needed standing up from a chair using your arms (e.g., wheelchair or bedside chair)?: Total Help needed to walk in hospital room?: Total Help needed climbing 3-5 steps with a railing? : Total 6 Click Score: 13    End of Session   Activity Tolerance: Patient tolerated treatment well Patient left: in bed;with call bell/phone within reach;with bed alarm set;with nursing/sitter in room Nurse Communication: Mobility status PT Visit Diagnosis: Other abnormalities of gait and mobility (R26.89)    Time: [id number] PT Time Calculation (min) (ACUTE ONLY): 15 min   Charges:   PT Evaluation $PT Eval Moderate Complexity: 1 Mod          Obert Pager [phone] Office [place] 09/16/2020, 4:12 PM

## 2020-09-16 NOTE — Evaluation (Signed)
Occupational Therapy Evaluation Patient Details Name: Christy Zhang MRN: [id number] DOB: 07/15/1955 Today's Date: 09/16/2020    History of Present Illness Pt adm on 5/19 for rt BKA due to non healing transmetatarsal amputation.  PMH includes L BKA 06/08/20, rt transmet amputation, IDDM, ESRD on HD MWF, HTN, PAD.   Clinical Impression   Patient admitted for the diagnosis and procedure above.  She had a prior L BKA, and was at [facility] for part of that recovery.  She has the needed assist at home, and all DME required.  PTA she spent a lot of time in the wheelchair, had assist for transfers with use of a slide board.  The patient's daughter assisted with bed level ADL.  Barriers are listed below.  Currently she is close to her baseline for ADL completion, and needing up to [place] for slide board transfers.  PT eval is pending.  Home with home health services is her preference, slide board tranfers may need to be reviewed in the home.  SNF can be a possibility if she proves to be inconsistent and need additional rehab prior to home.  OT will continue to follow to maximize her functional progress, and ensure discharge recommendation is appropriate.      Follow Up Recommendations  Home health OT    Equipment Recommendations  None recommended by OT    Recommendations for Other Services       Precautions / Restrictions Precautions Precautions: Fall Restrictions Weight Bearing Restrictions: Yes RLE Weight Bearing: Non weight bearing LLE Weight Bearing: Non weight bearing      Mobility Bed Mobility Overal bed mobility: Needs Assistance Bed Mobility: Supine to Sit     Supine to sit: Min assist       Patient Response: Cooperative  Transfers Overall transfer level: Needs assistance Equipment used: Sliding board Transfers: Lateral/Scoot Transfers          Lateral/Scoot Transfers: Min assist      Balance Overall balance assessment: Needs assistance Sitting-balance support:  Bilateral upper extremity supported Sitting balance-Leahy Scale: Fair                                     ADL either performed or assessed with clinical judgement   ADL Overall ADL's : Needs assistance/impaired Eating/Feeding: Independent;Sitting   Grooming: Wash/dry hands;Wash/dry face;Set up;Bed level           Upper Body Dressing : Minimal assistance;Sitting                           Vision Baseline Vision/History: Wears glasses Wears Glasses: Reading only Patient Visual Report: No change from baseline       Perception     Praxis      Pertinent Vitals/Pain Pain Assessment: 0-10 Pain Score: 3  Pain Location: incisional Pain Descriptors / Indicators: Tender Pain Intervention(s): Monitored during session     Hand Dominance Right   Extremity/Trunk Assessment Upper Extremity Assessment Upper Extremity Assessment: Overall WFL for tasks assessed   Lower Extremity Assessment Lower Extremity Assessment: Defer to PT evaluation   Cervical / Trunk Assessment Cervical / Trunk Assessment: Normal   Communication Communication Communication: No difficulties   Cognition Arousal/Alertness: Lethargic Behavior During Therapy: WFL for tasks assessed/performed Overall Cognitive Status: Within Functional Limits for tasks assessed  General Comments   VSS of RA    Exercises     Shoulder Instructions      Home Living Family/patient expects to be discharged to:: Private residence Living Arrangements: Children;Other relatives Available Help at Discharge: Family;Available 24 hours/day Type of Home: House Home Access: Ramped entrance     Home Layout: Two level;Able to live on main level with bedroom/bathroom     Bathroom Shower/Tub: Teacher, early years/pre: Standard Bathroom Accessibility: Yes How Accessible: Accessible via walker Home Equipment: [place] - 2 wheels;Tub bench;Hand  held shower head;Wheelchair - [employer];Hospital bed;Other (comment) (hoyer lift)          Prior Functioning/Environment Level of Independence: Needs assistance  Gait / Transfers Assistance Needed: uses w/c for mobility, but was able to stand pivot per patient ADL's / Homemaking Assistance Needed: Pt receives assist from daughter for ADLs, daughter is home with her all the time to assist.  She does not participate with meals or home management.   Comments: daughter takes pt to and from dialysis        OT Problem List: Decreased strength;Impaired balance (sitting and/or standing);Pain      OT Treatment/Interventions: Self-care/ADL training;Therapeutic exercise;DME and/or AE instruction;Balance training;Therapeutic activities    OT Goals(Current goals can be found in the care plan section) Acute Rehab OT Goals Patient Stated Goal: Hoping to transition home. OT Goal Formulation: With patient Time For Goal Achievement: 09/30/20 Potential to Achieve Goals: Fair ADL Goals Pt Will Perform Grooming: with set-up;sitting Pt Will Perform Upper Body Bathing: with set-up;sitting Pt Will Perform Lower Body Bathing: with min assist;bed level Pt Will Perform Upper Body Dressing: with supervision;sitting Pt Will Perform Lower Body Dressing: with min assist;bed level Pt Will Transfer to Toilet: with min guard assist;with transfer board;bedside commode  OT Frequency: Min 2X/week   Barriers to D/C:    none noted       Co-evaluation              AM-PAC OT "6 Clicks" Daily Activity     Outcome Measure Help from another person eating meals?: None Help from another person taking care of personal grooming?: None Help from another person toileting, which includes using toliet, bedpan, or urinal?: A Lot Help from another person bathing (including washing, rinsing, drying)?: A Lot Help from another person to put on and taking off regular upper body clothing?: A Little Help from  another person to put on and taking off regular lower body clothing?: A Lot 6 Click Score: 17   End of Session Equipment Utilized During Treatment: Gait belt;Other (comment) (slide board) Nurse Communication: Mobility status  Activity Tolerance: Patient tolerated treatment well Patient left: in chair;with call bell/phone within reach  OT Visit Diagnosis: Muscle weakness (generalized) (M62.81);Pain Pain - Right/Left: Right Pain - part of body: Leg                Time: [id number] OT Time Calculation (min): 25 min Charges:  OT General Charges $OT Visit: 1 Visit OT Evaluation $OT Eval Moderate Complexity: 1 Mod OT Treatments $Therapeutic Activity: 8-22 mins  09/16/2020  Rich, OTR/L  Acute Rehabilitation Services  Office:  [phone]   Christy Zhang 09/16/2020, 12:35 PM

## 2020-09-16 NOTE — Progress Notes (Addendum)
  Progress Note    09/16/2020 7:56 AM 2 Days Post-Op  Subjective:  No complaints   Vitals:   09/16/20 0322 09/16/20 0749  BP: (!) 131/39 (!) 129/29  Pulse: 70 71  Resp: 16 16  Temp: 98.7 F (37.1 C) 98.5 F (36.9 C)  SpO2: 94% 96%   Physical Exam: Lungs:  Non labored Incisions:  Viable skin edges R BKA; mid incision with sanguinous oozing, no hematoma Neurologic: A&O  CBC    Component Value Date/Time   WBC 21.9 (H) 09/15/2020 0844   RBC 4.11 09/15/2020 0844   HGB 10.4 (L) 09/15/2020 0844   HGB 9.2 (L) 07/20/2020 1438   HCT 33.8 (L) 09/15/2020 0844   HCT 31.5 (L) 07/20/2020 1438   PLT 572 (H) 09/15/2020 0844   PLT 716 (H) 07/20/2020 1438   MCV 82.2 09/15/2020 0844   MCV 85 07/20/2020 1438   MCH 25.3 (L) 09/15/2020 0844   MCHC 30.8 09/15/2020 0844   RDW 17.0 (H) 09/15/2020 0844   RDW 16.1 (H) 07/20/2020 1438   LYMPHSABS 1.7 08/18/2020 0123   LYMPHSABS 2.0 07/20/2020 1438   MONOABS 1.1 (H) 08/18/2020 0123   EOSABS 1.0 (H) 08/18/2020 0123   EOSABS 0.5 (H) 07/20/2020 1438   BASOSABS 0.0 08/18/2020 0123   BASOSABS 0.1 07/20/2020 1438    BMET    Component Value Date/Time   NA 131 (L) 09/16/2020 0119   NA 139 07/20/2020 1438   K 3.7 09/16/2020 0119   CL 93 (L) 09/16/2020 0119   CO2 28 09/16/2020 0119   GLUCOSE 124 (H) 09/16/2020 0119   BUN 18 09/16/2020 0119   BUN 24 07/20/2020 1438   CREATININE 5.62 (H) 09/16/2020 0119   CALCIUM 7.9 (L) 09/16/2020 0119   GFRNONAA 8 (L) 09/16/2020 0119   GFRAA 8 (L) 04/06/2020 1113    INR    Component Value Date/Time   INR 1.1 09/14/2020 0731     Intake/Output Summary (Last 24 hours) at 09/16/2020 0756 Last data filed at 09/15/2020 1738 Gross per 24 hour  Intake --  Output 2000 ml  Net -2000 ml     Assessment/Plan:  65 y.o. female is s/p R BKA 2 Days Post-Op   Dressing changed this morning; mid incision oozing.  If bleeding continues from this area 1-2 staples will be removed and will attempt to pack with  wet to dry.  Ok to proceed with PT/OT eval for placement   Dagoberto Ligas, PA-C [facility] [phone] 09/16/2020 7:56 AM  I have interviewed the patient and examined the patient. I agree with the findings by the PA.  Gae Gallop, MD [phone]

## 2020-09-16 NOTE — Progress Notes (Addendum)
PROGRESS NOTE                                                                             PROGRESS NOTE                                                                                                                                                                                                             Patient Demographics:    Christy Zhang, is a 65 y.o. female, DOB - [birth year], [id number]  Outpatient Primary MD for the patient is Claretta Fraise, MD    LOS - 2  Admit date - 09/14/2020    No chief complaint on file.      Brief Narrative    Christy Zhang is a 65 y.o. female with medical history significant of ESRD on HD(Monday, Wednesday, Friday),  DM type II, PAD with prior right TMA with wound VAC on 08/11/2020 by Dr. Donzetta Matters who presenting for nonhealing right transmetatarsal amputation.  She had followed up in office on 5/10 for postop wound check and wound VAC changes noted to have purulent drainage.  Patient was taken to the operating room today and underwent right BKA by Dr. Donzetta Matters with only minimal blood loss reported.  Patient had gone to hemodialysis last yesterday.  Denies any complaints of shortness of breath, chest pain, orthopnea, nausea, vomiting, or fevers.   Subjective:    Christy Zhang today denies any fever or chills, reports her pain is controlled   Assessment  & Plan :    Principal Problem:   S/P BKA (below knee amputation) unilateral, right (HCC) Active Problems:   Controlled type 2 diabetes mellitus with complication, with long-term current use of insulin (HCC)   Anemia in chronic kidney disease   Thrombocytosis   ESRD on hemodialysis (HCC)   PVD (peripheral vascular disease) (HCC)  S/P BKA (below knee amputation) unilateral, right:  - Patient had a nonhealing transmetatarsal amputation of the right foot for which she underwent an below-knee amputation on the right today with Dr. Donzetta Matters. -Tramadol/oxycodone  as  needed for pain -Transitions of care consulted -Patient with mild incision oozing, vascular surgery input appreciated, if bleeding continues then 1-2 staples will be removed and will attempt back with wet-to-dry.  ESRD on HD: -HD per nephrology.   Diabetes mellitus type 2:  - Patient appears to be relatively well controlled.  Last hemoglobin A1c was 6.5 on 08/09/2020.  Home medications include Trulicity 1.5 mg q. Saturday, and Levemir 16 units q. at bedtime daily -Hypoglycemic protocols -CBGs before every meal  with a very sensitive SSI -Continue home Levemir 16 units.  Anemia of chronic disease: Hemoglobin 9.9 g/dL.  Patient appears to be hemodynamic stable following her procedure. -Hemoglobin at baseline  Essential hypertension -Continue metoprolol and amlodipine -Monitor for the possibility of hypotension following amputation and adjust medications as needed  Peripheral vascular disease -Continue statin, hold  Plavix today given bleed at the surgical site.  Thrombocytosis: Acute.  Likely reactive in nature. -Continue to monitor  Hyperlipidemia -Continue atorvastatin    SpO2: 97 % O2 Flow Rate (L/min): 2 L/min  Recent Labs  Lab 09/13/20 1429 09/14/20 0731 09/15/20 0159 09/15/20 0843 09/15/20 0844 09/16/20 0119  WBC  --  13.5* 17.6*  --  21.9*  --   PLT  --  486* 443*  --  572*  --   AST  --  13*  --   --   --   --   ALT  --  9  --   --   --   --   ALKPHOS  --  48  --   --   --   --   BILITOT  --  0.8  --   --   --   --   ALBUMIN  --  2.6* 2.5* 2.9*  --  2.3*  INR  --  1.1  --   --   --   --   SARSCOV2NAA NEGATIVE  --   --   --   --   --        ABG     Component Value Date/Time   TCO2 28 06/08/2020 0812          Condition - Extremely Guarded  Family Communication  :  None at bedside  Code Status :  Full  Consults  :  Vascular, renal  Procedures  :  Right below-knee amputation  Disposition Plan  :    Status is: Inpatient  Remains  inpatient appropriate because:Ongoing diagnostic testing needed not appropriate for outpatient work up   Dispo: The patient is from: Home              Anticipated d/c is to: Home              Patient currently is not medically stable to d/c.   Difficult to place patient No      DVT Prophylaxis  :  Heparin   Lab Results  Component Value Date   PLT 572 (H) 09/15/2020    Diet :  Diet Order            Diet renal/carb modified with fluid restriction Diet-HS Snack? Nothing; Fluid restriction: 1200 mL Fluid; Room service appropriate? Yes; Fluid consistency: Thin  Diet effective now                  Inpatient Medications  Scheduled Meds: . amLODipine  5 mg Oral QHS  . aspirin EC  81 mg Oral QHS  . atorvastatin  40 mg Oral QHS  . Chlorhexidine Gluconate Cloth  6 each Topical Q0600  . clopidogrel  75 mg Oral QPM  . gabapentin  300 mg Oral QHS  . heparin  5,000 Units Subcutaneous Q8H  . insulin aspart  0-6 Units Subcutaneous TID WC  . insulin detemir  16 Units Subcutaneous Daily  . metoprolol succinate  50 mg Oral QHS  . multivitamin  1 tablet Oral QHS  . polyethylene glycol  17 g Oral Daily  . senna-docusate  1 tablet Oral BID  . sevelamer carbonate  1,600 mg Oral With snacks  . sevelamer carbonate  2,400 mg Oral TID WC  . sodium chloride flush  3 mL Intravenous Q12H   Continuous Infusions:  PRN Meds:.acetaminophen **OR** acetaminophen, albuterol, ondansetron **OR** ondansetron (ZOFRAN) IV, oxyCODONE, traMADol  Antibiotics  :    Anti-infectives (From admission, onward)   Start     Dose/Rate Route Frequency Ordered Stop   09/14/20 0642  ceFAZolin (ANCEF) IVPB 2g/100 mL premix        2 g 200 mL/hr over 30 Minutes Intravenous 30 min pre-op 09/14/20 [id number] 09/14/20 0930       Everhett Bozard M.D on 09/16/2020 at 2:30 PM  To page go to [email or web address]   [facility] -  Office  [phone]    Objective:   Vitals:   09/16/20 0749 09/16/20 0900 09/16/20 1100  09/16/20 1141  BP: (!) 129/29 137/61  137/66  Pulse: 71   76  Resp: '16 14 15 19  '$ Temp: 98.5 F (36.9 C)   98.3 F (36.8 C)  TempSrc: Oral   Oral  SpO2: 96%   97%  Weight:      Height:        Wt Readings from Last 3 Encounters:  09/16/20 94.2 kg  09/05/20 97.5 kg  08/25/20 98 kg     Intake/Output Summary (Last 24 hours) at 09/16/2020 1430 Last data filed at 09/16/2020 0900 Gross per 24 hour  Intake 240 ml  Output 2000 ml  Net -1760 ml     Physical Exam  Awake Alert, Oriented X 3, No new F.N deficits, Normal affect Symmetrical Chest wall movement, Good air movement bilaterally, CTAB RRR,No Gallops,Rubs or new Murmurs, No Parasternal Heave +ve B.Sounds, Abd Soft, No tenderness, No rebound - guarding or rigidity. Bilateral BKA, bilateral BKA bandaged with Ace wrap    Data Review:    CBC Recent Labs  Lab 09/14/20 0731 09/15/20 0159 09/15/20 0844  WBC 13.5* 17.6* 21.9*  HGB 9.9* 9.6* 10.4*  HCT 32.7* 31.7* 33.8*  PLT 486* 443* 572*  MCV 83.6 82.8 82.2  MCH 25.3* 25.1* 25.3*  MCHC 30.3 30.3 30.8  RDW 17.4* 17.1* 17.0*    Recent Labs  Lab 09/14/20 0731 09/15/20 0159 09/15/20 0843 09/16/20 0119  NA 134* 133* 132* 131*  K 4.3 5.2* 4.9 3.7  CL 96* 94* 95* 93*  CO2 '29 25 26 28  '$ GLUCOSE 99 169* 198* 124*  BUN 18 33* 37* 18  CREATININE 7.38* 8.29* 8.58* 5.62*  CALCIUM 8.5* 8.3* 9.0 7.9*  AST 13*  --   --   --   ALT 9  --   --   --   ALKPHOS 48  --   --   --   BILITOT 0.8  --   --   --   ALBUMIN 2.6* 2.5* 2.9* 2.3*  INR 1.1  --   --   --     ------------------------------------------------------------------------------------------------------------------ No results  for input(s): CHOL, HDL, LDLCALC, TRIG, CHOLHDL, LDLDIRECT in the last 72 hours.  Lab Results  Component Value Date   HGBA1C 6.5 (H) 08/09/2020   ------------------------------------------------------------------------------------------------------------------ No results for input(s):  TSH, T4TOTAL, T3FREE, THYROIDAB in the last 72 hours.  Invalid input(s): FREET3  Cardiac Enzymes No results for input(s): CKMB, TROPONINI, MYOGLOBIN in the last 168 hours.  Invalid input(s): CK ------------------------------------------------------------------------------------------------------------------ No results found for: BNP  Micro Results Recent Results (from the past 240 hour(s))  SARS CORONAVIRUS 2 (TAT 6-24 HRS) Nasopharyngeal Nasopharyngeal Swab     Status: None   Collection Time: 09/13/20  2:29 PM   Specimen: Nasopharyngeal Swab  Result Value Ref Range Status   SARS Coronavirus 2 NEGATIVE NEGATIVE Final    Comment: (NOTE) SARS-CoV-2 target nucleic acids are NOT DETECTED.  The SARS-CoV-2 RNA is generally detectable in upper and lower respiratory specimens during the acute phase of infection. Negative results do not preclude SARS-CoV-2 infection, do not rule out co-infections with other pathogens, and should not be used as the sole basis for treatment or other patient management decisions. Negative results must be combined with clinical observations, patient history, and epidemiological information. The expected result is Negative.  Fact Sheet for Patients: SugarRoll.be  Fact Sheet for Healthcare Providers: https://www.woods-mathews.com/  This test is not yet approved or cleared by the [place] FDA and  has been authorized for detection and/or diagnosis of SARS-CoV-2 by FDA under an Emergency Use Authorization (EUA). This EUA will remain  in effect (meaning this test can be used) for the duration of the COVID-19 declaration under Se ction 564(b)(1) of the Act, 21 U.S.C. section 360bbb-3(b)(1), unless the authorization is terminated or revoked sooner.  Performed at [facility], [place] [address], [place], [place] [address]     Radiology Reports No results found.

## 2020-09-17 DIAGNOSIS — D631 Anemia in chronic kidney disease: Secondary | ICD-10-CM | POA: Diagnosis not present

## 2020-09-17 DIAGNOSIS — E118 Type 2 diabetes mellitus with unspecified complications: Secondary | ICD-10-CM | POA: Diagnosis not present

## 2020-09-17 DIAGNOSIS — N186 End stage renal disease: Secondary | ICD-10-CM | POA: Diagnosis not present

## 2020-09-17 DIAGNOSIS — Z89511 Acquired absence of right leg below knee: Secondary | ICD-10-CM | POA: Diagnosis not present

## 2020-09-17 LAB — CBC
HCT: 27.6 % — ABNORMAL LOW (ref 36.0–46.0)
Hemoglobin: 8.5 g/dL — ABNORMAL LOW (ref 12.0–15.0)
MCH: 25.3 pg — ABNORMAL LOW (ref 26.0–34.0)
MCHC: 30.8 g/dL (ref 30.0–36.0)
MCV: 82.1 fL (ref 80.0–100.0)
Platelets: 496 10*3/uL — ABNORMAL HIGH (ref 150–400)
RBC: 3.36 MIL/uL — ABNORMAL LOW (ref 3.87–5.11)
RDW: 17 % — ABNORMAL HIGH (ref 11.5–15.5)
WBC: 12.2 10*3/uL — ABNORMAL HIGH (ref 4.0–10.5)
nRBC: 0 % (ref 0.0–0.2)

## 2020-09-17 LAB — RENAL FUNCTION PANEL
Albumin: 2.4 g/dL — ABNORMAL LOW (ref 3.5–5.0)
Anion gap: 12 (ref 5–15)
BUN: 31 mg/dL — ABNORMAL HIGH (ref 8–23)
CO2: 27 mmol/L (ref 22–32)
Calcium: 8.1 mg/dL — ABNORMAL LOW (ref 8.9–10.3)
Chloride: 92 mmol/L — ABNORMAL LOW (ref 98–111)
Creatinine, Ser: 7.34 mg/dL — ABNORMAL HIGH (ref 0.44–1.00)
GFR, Estimated: 6 mL/min — ABNORMAL LOW (ref >60)
Glucose, Bld: 105 mg/dL — ABNORMAL HIGH (ref 70–99)
Phosphorus: 6.3 mg/dL — ABNORMAL HIGH (ref 2.5–4.6)
Potassium: 4.6 mmol/L (ref 3.5–5.1)
Sodium: 131 mmol/L — ABNORMAL LOW (ref 135–145)

## 2020-09-17 LAB — GLUCOSE, CAPILLARY
Glucose-Capillary: 106 mg/dL — ABNORMAL HIGH (ref 70–99)
Glucose-Capillary: 104 mg/dL — ABNORMAL HIGH (ref 70–99)
Glucose-Capillary: 166 mg/dL — ABNORMAL HIGH (ref 70–99)
Glucose-Capillary: 141 mg/dL — ABNORMAL HIGH (ref 70–99)

## 2020-09-17 MED ORDER — PANTOPRAZOLE SODIUM 40 MG PO TBEC
40.0000 mg | DELAYED_RELEASE_TABLET | Freq: Every day | ORAL | Status: DC
  Administered 2020-09-17 – 2020-09-18 (×2): 40 mg via ORAL
  Filled 2020-09-17 (×2): qty 1

## 2020-09-17 MED ORDER — DARBEPOETIN ALFA 100 MCG/0.5ML IJ SOSY
100.0000 ug | PREFILLED_SYRINGE | INTRAMUSCULAR | Status: DC
  Filled 2020-09-17: qty 0.5

## 2020-09-17 NOTE — Progress Notes (Addendum)
[facility] Progress Note   Subjective: No issues overnight.  Feeling well this AM.  Awaiting placement.     Objective Vitals:   09/17/20 0040 09/17/20 0300 09/17/20 0350 09/17/20 0858  BP: (!) 139/49  (!) 137/54 134/68  Pulse: 80  82 78  Resp: '15 14 13 13  '$ Temp: 99 F (37.2 C)  98.9 F (37.2 C) 98.8 F (37.1 C)  TempSrc: Oral  Oral Oral  SpO2: 94%  93% 92%  Weight:   95.2 kg   Height:       Physical Exam General: NAD Heart: S1,S2, soft murmur Lungs: CTAB Abdomen:S, NT Extremities: Bilateral BKA R BKA with ACE wrap--> clean  Dialysis Access: R AVG + T/B. R hand warm, well-perfused with pink nail beds.    Additional Objective Labs: Basic Metabolic Panel: Recent Labs  Lab 09/15/20 0843 09/16/20 0119 09/17/20 0052  NA 132* 131* 131*  K 4.9 3.7 4.6  CL 95* 93* 92*  CO2 '26 28 27  '$ GLUCOSE 198* 124* 105*  BUN 37* 18 31*  CREATININE 8.58* 5.62* 7.34*  CALCIUM 9.0 7.9* 8.1*  PHOS 6.4* 5.0* 6.3*   Liver Function Tests: Recent Labs  Lab 09/14/20 0731 09/15/20 0159 09/15/20 0843 09/16/20 0119 09/17/20 0052  AST 13*  --   --   --   --   ALT 9  --   --   --   --   ALKPHOS 48  --   --   --   --   BILITOT 0.8  --   --   --   --   PROT 7.5  --   --   --   --   ALBUMIN 2.6*   < > 2.9* 2.3* 2.4*   < > = values in this interval not displayed.   No results for input(s): LIPASE, AMYLASE in the last 168 hours. CBC: Recent Labs  Lab 09/14/20 0731 09/15/20 0159 09/15/20 0844 09/17/20 0052  WBC 13.5* 17.6* 21.9* 12.2*  HGB 9.9* 9.6* 10.4* 8.5*  HCT 32.7* 31.7* 33.8* 27.6*  MCV 83.6 82.8 82.2 82.1  PLT 486* 443* 572* 496*   Blood Culture    Component Value Date/Time   SDES BLOOD LEFT HAND 08/16/2020 1804   SPECREQUEST  08/16/2020 1804    BOTTLES DRAWN AEROBIC ONLY Blood Culture adequate volume   CULT  08/16/2020 1804    NO GROWTH 5 DAYS Performed at [facility] [address], [place],  [address]    REPTSTATUS 08/21/2020 FINAL  08/16/2020 1804    Cardiac Enzymes: No results for input(s): CKTOTAL, CKMB, CKMBINDEX, TROPONINI in the last 168 hours. CBG: Recent Labs  Lab 09/16/20 0611 09/16/20 1140 09/16/20 1617 09/16/20 2121 09/17/20 0610  GLUCAP 80 106* 155* 127* 106*   Iron Studies: No results for input(s): IRON, TIBC, TRANSFERRIN, FERRITIN in the last 72 hours. '@lablastinr3'$ @ Studies/Results: No results found. Medications:  . amLODipine  5 mg Oral QHS  . aspirin EC  81 mg Oral QHS  . atorvastatin  40 mg Oral QHS  . Chlorhexidine Gluconate Cloth  6 each Topical Q0600  . clopidogrel  75 mg Oral QPM  . gabapentin  300 mg Oral QHS  . heparin  5,000 Units Subcutaneous Q8H  . insulin aspart  0-6 Units Subcutaneous TID WC  . insulin detemir  16 Units Subcutaneous Daily  . metoprolol succinate  50 mg Oral QHS  . multivitamin  1 tablet Oral QHS  . polyethylene glycol  17 g Oral Daily  . senna-docusate  1 tablet Oral BID  . sevelamer carbonate  1,600 mg Oral With snacks  . sevelamer carbonate  2,400 mg Oral TID WC  . sodium chloride flush  3 mL Intravenous Q12H     Dialysis Orders: [facility] - MWF 4 hrs 400/500 97 kg 2.0K/2.5 Ca 16g needles Add UFP 2 on discharge -Heparin 1000 units initial bolus-heparin pump 1000 units/hr stop 1 hour prior to end of tx.  -Epogen 14000 units IV TIW -Hectorol 1.5 mcg IV TIW   Assessment/Plan: 1. PAD w/ Non healing TMA- s/p BKA 09/14/2020 by Dr. Donzetta Matters. Pain well controlled. Drainage resolved 2. ESRD- HD MWF. Continue MWF schedule- orders written.  No heparin post surgery.  3. Hypertension/volume- BP variable, monitor. Does not appear volume overloaded. UF as tolerated. Will need lower dry on d/c after amputation.  4. Anemiaof CKD- 10.4--> 8.5.  Add Aranesp for Monday's HD.   5. Secondary Hyperparathyroidism -Ca/PO4 at goal.  Continue binders. Renvela 800 mg  3 tabs PO TID AC 2 with snacks.  6. Nutrition- Renal diet w/fluids  restrictions. 7. Numbness finger of R hand distal to AVG at end of HD session: Fingers slightly cooler than L hand initially. Has full use of R hand. Changed to UFP 2 with HD 09/15/20 with resolution of symptoms.   Madelon Lips MD 09/17/2020, 10:34 AM  [facility] [phone]

## 2020-09-17 NOTE — Progress Notes (Signed)
PROGRESS NOTE                                                                             PROGRESS NOTE                                                                                                                                                                                                             Patient Demographics:    Christy Zhang, is a 65 y.o. female, DOB - 06-Sep-1955, [id number]  Outpatient Primary MD for the patient is Claretta Fraise, MD    LOS - 3  Admit date - 09/14/2020    No chief complaint on file.      Brief Narrative    Christy Zhang is a 65 y.o. female with medical history significant of ESRD on HD(Monday, Wednesday, Friday),  DM type II, PAD with prior right TMA with wound VAC on 08/11/2020 by Dr. Donzetta Matters who presenting for nonhealing right transmetatarsal amputation.  She had followed up in office on 5/10 for postop wound check and wound VAC changes noted to have purulent drainage.  Patient was taken to the operating room today and underwent right BKA by Dr. Donzetta Matters with only minimal blood loss reported.  Patient had gone to hemodialysis last yesterday.  Denies any complaints of shortness of breath, chest pain, orthopnea, nausea, vomiting, or fevers.   Subjective:    [name] today for surgical site, but overall is controlled, reports she is currently sleepy as she had a poor night sleep.     Assessment  & Plan :    Principal Problem:   S/P BKA (below knee amputation) unilateral, right (HCC) Active Problems:   Controlled type 2 diabetes mellitus with complication, with long-term current use of insulin (HCC)   Anemia in chronic kidney disease   Thrombocytosis   ESRD on hemodialysis (HCC)   PVD (peripheral vascular disease) (HCC)  S/P BKA (below knee amputation) unilateral, right:  - Patient had a nonhealing transmetatarsal amputation of the right foot for which she underwent  an below-knee amputation on the  right today with Dr. Donzetta Matters. -Tramadol/oxycodone as needed for pain -Transitions of care consulted -No further drainage or oozing from mid incision site, dressing was reapplied, she can probably be switched to retention sock tomorrow if no drainage over next 24 hours.   ESRD on HD: -HD per nephrology, she is for hemodialysis tomorrow.   Diabetes mellitus type 2:  - Patient appears to be relatively well controlled.  Last hemoglobin A1c was 6.5 on 08/09/2020.  Home medications include Trulicity 1.5 mg q. Saturday, and Levemir 16 units q. at bedtime daily -Hypoglycemic protocols -CBGs before every meal  with a very sensitive SSI -Continue home Levemir 16 units.  Anemia of chronic disease: Hemoglobin 9.9 g/dL.  Patient appears to be hemodynamic stable following her procedure. -Hemoglobin at baseline  Essential hypertension -Continue metoprolol and amlodipine  Peripheral vascular disease -Continue statin,aspirin  and plavix  Thrombocytosis: Acute.  Likely reactive in nature. -Continue to monitor  Hyperlipidemia -Continue atorvastatin    SpO2: 95 % O2 Flow Rate (L/min): 2 L/min  Recent Labs  Lab 09/13/20 1429 09/14/20 0731 09/15/20 0159 09/15/20 0843 09/15/20 0844 09/16/20 0119 09/17/20 0052  WBC  --  13.5* 17.6*  --  21.9*  --  12.2*  PLT  --  486* 443*  --  572*  --  496*  AST  --  13*  --   --   --   --   --   ALT  --  9  --   --   --   --   --   ALKPHOS  --  48  --   --   --   --   --   BILITOT  --  0.8  --   --   --   --   --   ALBUMIN  --  2.6* 2.5* 2.9*  --  2.3* 2.4*  INR  --  1.1  --   --   --   --   --   SARSCOV2NAA NEGATIVE  --   --   --   --   --   --        ABG     Component Value Date/Time   TCO2 28 06/08/2020 0812          Condition - Extremely Guarded  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   Difficult to place patient No      DVT Prophylaxis  :  Heparin   Lab Results  Component Value Date   PLT 496 (H) 09/17/2020    Diet :  Diet Order            Diet renal/carb modified with fluid restriction Diet-HS Snack? Nothing; Fluid restriction: 1200 mL Fluid; Room service appropriate? Yes; Fluid consistency: Thin  Diet effective now                  Inpatient  Medications  Scheduled Meds: . amLODipine  5 mg Oral QHS  . aspirin EC  81 mg Oral QHS  . atorvastatin  40 mg Oral QHS  . Chlorhexidine Gluconate Cloth  6 each Topical Q0600  . clopidogrel  75 mg Oral QPM  . [START ON 09/18/2020] darbepoetin (ARANESP) injection - DIALYSIS  100 mcg Intravenous Q Mon-HD  . gabapentin  300 mg Oral QHS  . heparin  5,000 Units Subcutaneous Q8H  . insulin aspart  0-6 Units Subcutaneous TID WC  . insulin detemir  16 Units Subcutaneous Daily  . metoprolol succinate  50 mg Oral QHS  . multivitamin  1 tablet Oral QHS  . polyethylene glycol  17 g Oral Daily  . senna-docusate  1 tablet Oral BID  . sevelamer carbonate  1,600 mg Oral With snacks  . sevelamer carbonate  2,400 mg Oral TID WC  . sodium chloride flush  3 mL Intravenous Q12H   Continuous Infusions:  PRN Meds:.acetaminophen **OR** acetaminophen, albuterol, ondansetron **OR** ondansetron (ZOFRAN) IV, oxyCODONE, traMADol  Antibiotics  :    Anti-infectives (From admission, onward)   Start     Dose/Rate Route Frequency Ordered Stop   09/14/20 0642  ceFAZolin (ANCEF) IVPB 2g/100 mL premix        2 g 200 mL/hr over 30 Minutes Intravenous 30 min pre-op 09/14/20 [id number] 09/14/20 0930       Wyoma Genson M.D on 09/17/2020 at 11:46 AM  To page go to  [email or web address]   [facility] -  Office  [phone]    Objective:   Vitals:   09/17/20 0300 09/17/20 0350 09/17/20 0858 09/17/20 1139  BP:  (!) 137/54 134/68 (!) 144/43  Pulse:  82 78 79  Resp: '14 13 13 16  '$ Temp:  98.9 F (37.2 C) 98.8 F (37.1 C) 97.7 F (36.5 C)  TempSrc:  Oral Oral Oral  SpO2:  93% 92% 95%  Weight:  95.2 kg    Height:        Wt Readings from Last 3 Encounters:  09/17/20 95.2 kg  09/05/20 97.5 kg  08/25/20 98 kg     Intake/Output Summary (Last 24 hours) at 09/17/2020 1146 Last data filed at 09/16/2020 2020 Gross per 24 hour  Intake 360 ml  Output --  Net 360 ml     Physical Exam  Awake Alert, Oriented X 3, No new F.N deficits, Normal affect Symmetrical Chest wall movement, Good air movement bilaterally, CTAB RRR,No Gallops,Rubs or new Murmurs, No Parasternal Heave +ve B.Sounds, Abd Soft, No tenderness, No rebound - guarding or rigidity. B/L BKA, right with Ace wrap, no bleed or discharge    Data Review:    CBC Recent Labs  Lab 09/14/20 0731 09/15/20 0159 09/15/20 0844 09/17/20 0052  WBC 13.5* 17.6* 21.9* 12.2*  HGB 9.9* 9.6* 10.4* 8.5*  HCT 32.7* 31.7* 33.8* 27.6*  PLT 486* 443* 572* 496*  MCV 83.6 82.8 82.2 82.1  MCH 25.3* 25.1* 25.3* 25.3*  MCHC 30.3 30.3 30.8 30.8  RDW 17.4* 17.1* 17.0* 17.0*    Recent Labs  Lab 09/14/20 0731 09/15/20 0159 09/15/20 0843 09/16/20 0119 09/17/20 0052  NA 134* 133* 132* 131* 131*  K 4.3 5.2* 4.9 3.7 4.6  CL 96* 94* 95* 93* 92*  CO2 '29 25 26 28 27  '$ GLUCOSE 99 169* 198* 124* 105*  BUN 18 33* 37* 18 31*  CREATININE 7.38* 8.29* 8.58* 5.62* 7.34*  CALCIUM 8.5* 8.3* 9.0 7.9* 8.1*  AST 13*  --   --   --   --  ALT 9  --   --   --   --   ALKPHOS 48  --   --   --   --   BILITOT 0.8  --   --   --   --   ALBUMIN 2.6* 2.5* 2.9* 2.3* 2.4*  INR 1.1  --   --   --   --      ------------------------------------------------------------------------------------------------------------------ No results for input(s): CHOL, HDL, LDLCALC, TRIG, CHOLHDL, LDLDIRECT in the last 72 hours.  Lab Results  Component Value Date   HGBA1C 6.5 (H) 08/09/2020   ------------------------------------------------------------------------------------------------------------------ No results for input(s): TSH, T4TOTAL, T3FREE, THYROIDAB in the last 72 hours.  Invalid input(s): FREET3  Cardiac Enzymes No results for input(s): CKMB, TROPONINI, MYOGLOBIN in the last 168 hours.  Invalid input(s): CK ------------------------------------------------------------------------------------------------------------------ No results found for: BNP  Micro Results Recent Results (from the past 240 hour(s))  SARS CORONAVIRUS 2 (TAT 6-24 HRS) Nasopharyngeal Nasopharyngeal Swab     Status: None   Collection Time: 09/13/20  2:29 PM   Specimen: Nasopharyngeal Swab  Result Value Ref Range Status   SARS Coronavirus 2 NEGATIVE NEGATIVE Final    Comment: (NOTE) SARS-CoV-2 target nucleic acids are NOT DETECTED.  The SARS-CoV-2 RNA is generally detectable in upper and lower respiratory specimens during the acute phase of infection. Negative results do not preclude SARS-CoV-2 infection, do not rule out co-infections with other pathogens, and should not be used as the sole basis for treatment or other patient management decisions. Negative results must be combined with clinical observations, patient history, and epidemiological information. The expected result is Negative.  Fact Sheet for Patients: SugarRoll.be  Fact Sheet for Healthcare Providers: https://www.woods-mathews.com/  This test is not yet approved or cleared by the [place] FDA and  has been authorized for detection and/or diagnosis of SARS-CoV-2 by FDA under an Emergency Use  Authorization (EUA). This EUA will remain  in effect (meaning this test can be used) for the duration of the COVID-19 declaration under Se ction 564(b)(1) of the Act, 21 U.S.C. section 360bbb-3(b)(1), unless the authorization is terminated or revoked sooner.  Performed at [facility], [place] [address], [place],  [address]     Radiology Reports No results found.

## 2020-09-17 NOTE — Progress Notes (Signed)
Mobility Specialist: Progress Note   09/17/20 1153  Mobility  Activity Transferred:  Bed to chair  Level of Assistance Minimal assist, patient does 75% or more  Assistive Device Sliding board  Mobility Out of bed to chair with meals  Mobility Response Tolerated well  Mobility performed by Mobility specialist  Bed Position Chair  $Mobility charge 1 Mobility   Pre-Mobility: 79 HR, 144/43 BP, 93% SpO2 Post-Mobility: 86 HR,  SpO2  Pt asx during transfer. Pt was minA with balance during transfer and required some set up cues. Chair alarm in on.   [facility] Annalyse Langlais Mobility Specialist Mobility Specialist Phone: [phone]

## 2020-09-17 NOTE — Progress Notes (Signed)
Mobility Specialist: Progress Note   09/17/20 1716  Mobility  Activity Transferred:  Chair to bed  Level of Assistance Moderate assist, patient does 50-74%  Assistive Device Sliding board  Mobility Sit up in bed/chair position for meals  Mobility Response Tolerated fair  Mobility performed by Mobility specialist  $Mobility charge 1 Mobility   Post-Mobility: 82 HR  Pt assisted back to the bed using sliding board. Pt required frequent cues for hand placement and balance during transfer. Pt required modA to transfer back using chuck pad to help slide pt and one hand on pt's back for balance. Pt back in bed with bed alarm on and call bell at her side.   [facility] Leonides Minder Mobility Specialist Mobility Specialist Phone: [phone]

## 2020-09-17 NOTE — Progress Notes (Addendum)
  Progress Note    09/17/2020 7:50 AM 3 Days Post-Op  Subjective:  Some soreness to R BKA incision this morning but tolerable   Vitals:   09/17/20 0300 09/17/20 0350  BP:  (!) 137/54  Pulse:  82  Resp: 14 13  Temp:  98.9 F (37.2 C)  SpO2:  93%   Physical Exam: Lungs:  Non labored Incisions:  R BKA incision without drainage Abdomen:  Soft, NT, ND Neurologic: a&O  CBC    Component Value Date/Time   WBC 12.2 (H) 09/17/2020 0052   RBC 3.36 (L) 09/17/2020 0052   HGB 8.5 (L) 09/17/2020 0052   HGB 9.2 (L) 07/20/2020 1438   HCT 27.6 (L) 09/17/2020 0052   HCT 31.5 (L) 07/20/2020 1438   PLT 496 (H) 09/17/2020 0052   PLT 716 (H) 07/20/2020 1438   MCV 82.1 09/17/2020 0052   MCV 85 07/20/2020 1438   MCH 25.3 (L) 09/17/2020 0052   MCHC 30.8 09/17/2020 0052   RDW 17.0 (H) 09/17/2020 0052   RDW 16.1 (H) 07/20/2020 1438   LYMPHSABS 1.7 08/18/2020 0123   LYMPHSABS 2.0 07/20/2020 1438   MONOABS 1.1 (H) 08/18/2020 0123   EOSABS 1.0 (H) 08/18/2020 0123   EOSABS 0.5 (H) 07/20/2020 1438   BASOSABS 0.0 08/18/2020 0123   BASOSABS 0.1 07/20/2020 1438    BMET    Component Value Date/Time   NA 131 (L) 09/17/2020 0052   NA 139 07/20/2020 1438   K 4.6 09/17/2020 0052   CL 92 (L) 09/17/2020 0052   CO2 27 09/17/2020 0052   GLUCOSE 105 (H) 09/17/2020 0052   BUN 31 (H) 09/17/2020 0052   BUN 24 07/20/2020 1438   CREATININE 7.34 (H) 09/17/2020 0052   CALCIUM 8.1 (L) 09/17/2020 0052   GFRNONAA 6 (L) 09/17/2020 0052   GFRAA 8 (L) 04/06/2020 1113    INR    Component Value Date/Time   INR 1.1 09/14/2020 0731     Intake/Output Summary (Last 24 hours) at 09/17/2020 0750 Last data filed at 09/16/2020 2020 Gross per 24 hour  Intake 600 ml  Output --  Net 600 ml     Assessment/Plan:  65 y.o. female is s/p R BKA 3 Days Post-Op   Drainage has resolved from mid incision; dressing re-applied; can probably switch to retention sock tomorrow if no drainage over next 24h Continue  pain medicine regimen Encouraged participation with therapy teams [facility] for discharge when placement arranged; office will arrange staple removal in 4-6 weeks   Dagoberto Ligas, PA-C [facility] [phone] 09/17/2020 7:50 AM  I have interviewed the patient and examined the patient. I agree with the findings by the PA.  Her right below the knee amputation site is healing nicely.  Awaiting placement.  Gae Gallop, MD [phone]

## 2020-09-18 DIAGNOSIS — N186 End stage renal disease: Secondary | ICD-10-CM | POA: Diagnosis not present

## 2020-09-18 DIAGNOSIS — D631 Anemia in chronic kidney disease: Secondary | ICD-10-CM | POA: Diagnosis not present

## 2020-09-18 DIAGNOSIS — Z89511 Acquired absence of right leg below knee: Secondary | ICD-10-CM | POA: Diagnosis not present

## 2020-09-18 DIAGNOSIS — E118 Type 2 diabetes mellitus with unspecified complications: Secondary | ICD-10-CM | POA: Diagnosis not present

## 2020-09-18 LAB — RENAL FUNCTION PANEL
Albumin: 2.3 g/dL — ABNORMAL LOW (ref 3.5–5.0)
Anion gap: 10 (ref 5–15)
BUN: 42 mg/dL — ABNORMAL HIGH (ref 8–23)
CO2: 24 mmol/L (ref 22–32)
Calcium: 8.2 mg/dL — ABNORMAL LOW (ref 8.9–10.3)
Chloride: 93 mmol/L — ABNORMAL LOW (ref 98–111)
Creatinine, Ser: 9.25 mg/dL — ABNORMAL HIGH (ref 0.44–1.00)
GFR, Estimated: 4 mL/min — ABNORMAL LOW (ref >60)
Glucose, Bld: 137 mg/dL — ABNORMAL HIGH (ref 70–99)
Phosphorus: 5.8 mg/dL — ABNORMAL HIGH (ref 2.5–4.6)
Potassium: 5.8 mmol/L — ABNORMAL HIGH (ref 3.5–5.1)
Sodium: 127 mmol/L — ABNORMAL LOW (ref 135–145)

## 2020-09-18 LAB — CBC
HCT: 27.9 % — ABNORMAL LOW (ref 36.0–46.0)
Hemoglobin: 8.9 g/dL — ABNORMAL LOW (ref 12.0–15.0)
MCH: 25.5 pg — ABNORMAL LOW (ref 26.0–34.0)
MCHC: 31.9 g/dL (ref 30.0–36.0)
MCV: 79.9 fL — ABNORMAL LOW (ref 80.0–100.0)
Platelets: 499 10*3/uL — ABNORMAL HIGH (ref 150–400)
RBC: 3.49 MIL/uL — ABNORMAL LOW (ref 3.87–5.11)
RDW: 16.9 % — ABNORMAL HIGH (ref 11.5–15.5)
WBC: 17.4 10*3/uL — ABNORMAL HIGH (ref 4.0–10.5)
nRBC: 0 % (ref 0.0–0.2)

## 2020-09-18 LAB — GLUCOSE, CAPILLARY
Glucose-Capillary: 109 mg/dL — ABNORMAL HIGH (ref 70–99)
Glucose-Capillary: 94 mg/dL (ref 70–99)

## 2020-09-18 LAB — SURGICAL PATHOLOGY

## 2020-09-18 MED ORDER — OXYCODONE HCL 5 MG PO TABS: 5.0000 mg | ORAL_TABLET | Freq: Four times a day (QID) | ORAL | 0 refills | Status: AC | PRN

## 2020-09-18 MED ORDER — DARBEPOETIN ALFA 100 MCG/0.5ML IJ SOSY
PREFILLED_SYRINGE | INTRAMUSCULAR | Status: AC
  Administered 2020-09-18: 100 ug via INTRAVENOUS
  Filled 2020-09-18: qty 0.5

## 2020-09-18 NOTE — Progress Notes (Signed)
Renal Navigator faxed Discharge Summary, Renal Note and PT evaluation to patient's outpatient HD clinic/[facility] to provide continuity of care.   Alphonzo Cruise, [place] Renal Navigator [phone]

## 2020-09-18 NOTE — Progress Notes (Signed)
Orthopedic Tech Progress Note Patient Details:  [name] 12-Aug-1955 [id number] Called in order to [facility] for a RETENTION SOCK Patient ID: Christy Zhang, female   DOB: [birth year], 66 y.o.   MRN: [id number]   Janit Pagan 09/18/2020, 8:49 AM

## 2020-09-18 NOTE — Progress Notes (Signed)
OT Cancellation Note  Patient Details Name: Christy Zhang MRN: [id number] DOB: [birth year]   Cancelled Treatment:    Reason Eval/Treat Not Completed: Patient at procedure or test/ unavailable Pt off unit for HD. Will follow-up for OT session as appropriate.  Layla Maw 09/18/2020, 8:35 AM

## 2020-09-18 NOTE — Discharge Instructions (Signed)
Follow with Primary MD Claretta Fraise, MD in 7 days   Get CBC, CMP,checked  by Primary MD next visit.    Activity: As tolerated with Full fall precautions use walker/cane & assistance as needed   Disposition Home    Diet: renal diet with Fluid restriction 1200 cc/day.  On your next visit with your primary care physician please Get Medicines reviewed and adjusted.   Please request your Prim.MD to go over all Hospital Tests and Procedure/Radiological results at the follow up, please get all Hospital records sent to your Prim MD by signing hospital release before you go home.   If you experience worsening of your admission symptoms, develop shortness of breath, life threatening emergency, suicidal or homicidal thoughts you must seek medical attention immediately by calling 911 or calling your MD immediately  if symptoms less severe.  You Must read complete instructions/literature along with all the possible adverse reactions/side effects for all the Medicines you take and that have been prescribed to you. Take any new Medicines after you have completely understood and accpet all the possible adverse reactions/side effects.   Do not drive when taking Pain medications.    Do not take more than prescribed Pain, Sleep and Anxiety Medications  Special Instructions: If you have smoked or chewed Tobacco  in the last 2 yrs please stop smoking, stop any regular Alcohol  and or any Recreational drug use.  Wear Seat belts while driving.   Please note  You were cared for by a hospitalist during your hospital stay. If you have any questions about your discharge medications or the care you received while you were in the hospital after you are discharged, you can call the unit and asked to speak with the hospitalist on call if the hospitalist that took care of you is not available. Once you are discharged, your primary care physician will handle any further medical issues. Please note that NO  REFILLS for any discharge medications will be authorized once you are discharged, as it is imperative that you return to your primary care physician (or establish a relationship with a primary care physician if you do not have one) for your aftercare needs so that they can reassess your need for medications and monitor your lab values.

## 2020-09-18 NOTE — Discharge Summary (Signed)
Physician Discharge Summary  Christy Zhang [id number] DOB: Feb 18, 1956 DOA: 09/14/2020  PCP: Claretta Fraise, MD  Admit date: 09/14/2020 Discharge date: 09/18/2020  Admitted From: Home Disposition:  Home   Recommendations for Outpatient Follow-up:  1. Follow up with PCP in 1-2 weeks 2. Please obtain BMP/CBC in one week 3. Please follow with vascular surgery as an outpatient  Home Health: PT/OT/RN Equipment/Devices: She has a wheelchair, RN consulted for dressing changes  Discharge Condition:Stable CODE STATUS: Full Diet recommendation: Renal / Carb Modified   Brief/Interim Summary:    S/P BKA (below knee amputation) unilateral, right: - Patient had a nonhealing transmetatarsal amputation of the right foot for which she underwent an below-knee amputation on the right today with Dr. Donzetta Matters. -Her all pain has been controlled, she will be discharged on oxycodone as needed for pain -Transitions of care consulted -No further drainage or oozing from mid incision site, she will be discharged home with home health for dressing changes    ESRD on HD: -HD per nephrology,  Monday Wednesday Friday schedule  Diabetes mellitus type 2:  - Patient appears to be relatively well controlled. Last hemoglobin A1c was 6.5 on 08/09/2020. Resume home medications on discharge  Anemia of chronic disease:  -Hemoglobin at baseline  Essential hypertension -Continue metoprolol and amlodipine  Peripheral vascular disease -Continue statin,aspirin  and plavix  Thrombocytosis: Acute. Likely reactive in nature. -Continue to monitor  Hyperlipidemia -Continue atorvastatin    Discharge Diagnoses:  Principal Problem:   S/P BKA (below knee amputation) unilateral, right (HCC) Active Problems:   Controlled type 2 diabetes mellitus with complication, with long-term current use of insulin (HCC)   Anemia in chronic kidney disease   Thrombocytosis   ESRD on hemodialysis (HCC)   PVD  (peripheral vascular disease) ([place])    Discharge Instructions  Discharge Instructions    Diet - low sodium heart healthy   Complete by: As directed    Discharge instructions   Complete by: As directed    Follow with Primary MD Claretta Fraise, MD in 7 days   Get CBC, CMP,checked  by Primary MD next visit.    Activity: As tolerated with Full fall precautions use walker/cane & assistance as needed   Disposition Home    Diet: renal diet with Fluid restriction 1200 cc/day.  On your next visit with your primary care physician please Get Medicines reviewed and adjusted.   Please request your Prim.MD to go over all Hospital Tests and Procedure/Radiological results at the follow up, please get all Hospital records sent to your Prim MD by signing hospital release before you go home.   If you experience worsening of your admission symptoms, develop shortness of breath, life threatening emergency, suicidal or homicidal thoughts you must seek medical attention immediately by calling 911 or calling your MD immediately  if symptoms less severe.  You Must read complete instructions/literature along with all the possible adverse reactions/side effects for all the Medicines you take and that have been prescribed to you. Take any new Medicines after you have completely understood and accpet all the possible adverse reactions/side effects.   Do not drive when taking Pain medications.    Do not take more than prescribed Pain, Sleep and Anxiety Medications  Special Instructions: If you have smoked or chewed Tobacco  in the last 2 yrs please stop smoking, stop any regular Alcohol  and or any Recreational drug use.  Wear Seat belts while driving.   Please note  You were cared for  by a hospitalist during your hospital stay. If you have any questions about your discharge medications or the care you received while you were in the hospital after you are discharged, you can call the unit and asked  to speak with the hospitalist on call if the hospitalist that took care of you is not available. Once you are discharged, your primary care physician will handle any further medical issues. Please note that NO REFILLS for any discharge medications will be authorized once you are discharged, as it is imperative that you return to your primary care physician (or establish a relationship with a primary care physician if you do not have one) for your aftercare needs so that they can reassess your need for medications and monitor your lab values.   Discharge wound care:   Complete by: As directed    Please change dressing daily as right BKA site with Kerlix and 4 inches Ace wrap.   Increase activity slowly   Complete by: As directed      Allergies as of 09/18/2020      Reactions   Sulfa Antibiotics Swelling      Medication List    STOP taking these medications   traMADol 50 MG tablet Commonly known as: ULTRAM     TAKE these medications   acetaminophen 325 MG tablet Commonly known as: TYLENOL Take 1-2 tablets (325-650 mg total) by mouth every 4 (four) hours as needed for mild pain.   amLODipine 5 MG tablet Commonly known as: NORVASC TAKE ONE TABLET BY MOUTH AT BEDTIME What changed: when to take this   aspirin EC 81 MG tablet Take 81 mg by mouth at bedtime. Swallow whole.   atorvastatin 40 MG tablet Commonly known as: LIPITOR TAKE ONE TABLET BY MOUTH DAILY What changed:   how much to take  how to take this  when to take this   clopidogrel 75 MG tablet Commonly known as: Plavix Take 1 tablet (75 mg total) by mouth daily. What changed: when to take this   diphenhydrAMINE 25 MG tablet Commonly known as: BENADRYL Take 25 mg by mouth every 6 (six) hours as needed for itching.   gabapentin 300 MG capsule Commonly known as: NEURONTIN Take 1 capsule (300 mg total) by mouth at bedtime.   insulin detemir 100 UNIT/ML injection Commonly known as: Levemir Inject 0.16 mLs (16  Units total) into the skin daily.   Insulin Syringe-Needle U-100 28G X 1/2" 1 ML Misc Use with insulin daily Dx E11.22   metoprolol succinate 50 MG 24 hr tablet Commonly known as: TOPROL-XL TAKE ONE TABLET BY MOUTH DAILY. TAKE WITH OR IMMEDIATELY FOLLOWING A MEAL What changed: See the new instructions.   multivitamin Tabs tablet Take 1 tablet by mouth at bedtime.   oxyCODONE 5 MG immediate release tablet Commonly known as: Oxy IR/ROXICODONE Take 1 tablet (5 mg total) by mouth every 6 (six) hours as needed for severe pain. What changed: when to take this   polyethylene glycol 17 g packet Commonly known as: MIRALAX / GLYCOLAX Take 17 g by mouth daily. What changed:   when to take this  reasons to take this   senna-docusate 8.6-50 MG tablet Commonly known as: Senokot-S Take 1 tablet by mouth 2 (two) times daily. What changed:   when to take this  reasons to take this   sevelamer carbonate 800 MG tablet Commonly known as: RENVELA Take 1,600-2,400 mg by mouth See admin instructions. Take 2400 mg with each meal and  1600 mg with each snack   Trulicity 1.5 0000000 Sopn Generic drug: Dulaglutide INJECT CONTENTs OF ONE PEN UNDER THE SKIN WEEKLY ON SATURDAY What changed: See the new instructions.            Discharge Care Instructions  (From admission, onward)         Start     Ordered   09/18/20 0000  Discharge wound care:       Comments: Please change dressing daily as right BKA site with Kerlix and 4 inches Ace wrap.   09/18/20 1111          Allergies  Allergen Reactions  . Sulfa Antibiotics Swelling    Consultations:  Vascular surgery   Procedures/Studies:  No results found. Right below-knee amputation  Subjective: Complaints today, reports good night sleep, no nausea, no vomiting, pain is controlled.  Discharge Exam: Vitals:   09/18/20 1030 09/18/20 1100  BP: (!) 107/44 (!) 121/41  Pulse:    Resp:    Temp:    SpO2:     Vitals:    09/18/20 0948 09/18/20 1000 09/18/20 1030 09/18/20 1100  BP: (!) 90/45 (!) 110/27 (!) 107/44 (!) 121/41  Pulse:      Resp:      Temp:      TempSrc:      SpO2:      Weight:      Height:        General: Pt is alert, awake, not in acute distress Cardiovascular: RRR, S1/S2 +, no rubs, no gallops Respiratory: CTA bilaterally, no wheezing, no rhonchi Abdominal: Soft, NT, ND, bowel sounds + Extremities: Bilateral BKA, right BKA with Ace wrap, no oozing or discharge.    The results of significant diagnostics from this hospitalization (including imaging, microbiology, ancillary and laboratory) are listed below for reference.     Microbiology: Recent Results (from the past 240 hour(s))  SARS CORONAVIRUS 2 (TAT 6-24 HRS) Nasopharyngeal Nasopharyngeal Swab     Status: None   Collection Time: 09/13/20  2:29 PM   Specimen: Nasopharyngeal Swab  Result Value Ref Range Status   SARS Coronavirus 2 NEGATIVE NEGATIVE Final    Comment: (NOTE) SARS-CoV-2 target nucleic acids are NOT DETECTED.  The SARS-CoV-2 RNA is generally detectable in upper and lower respiratory specimens during the acute phase of infection. Negative results do not preclude SARS-CoV-2 infection, do not rule out co-infections with other pathogens, and should not be used as the sole basis for treatment or other patient management decisions. Negative results must be combined with clinical observations, patient history, and epidemiological information. The expected result is Negative.  Fact Sheet for Patients: SugarRoll.be  Fact Sheet for Healthcare Providers: https://www.woods-mathews.com/  This test is not yet approved or cleared by the [place] FDA and  has been authorized for detection and/or diagnosis of SARS-CoV-2 by FDA under an Emergency Use Authorization (EUA). This EUA will remain  in effect (meaning this test can be used) for the duration of the COVID-19 declaration  under Se ction 564(b)(1) of the Act, 21 U.S.C. section 360bbb-3(b)(1), unless the authorization is terminated or revoked sooner.  Performed at [facility], [place] [address], [place], [place] [address]      Labs: BNP (last 3 results) No results for input(s): BNP in the last 8760 hours. Basic Metabolic Panel: Recent Labs  Lab 09/15/20 0159 09/15/20 0843 09/16/20 0119 09/17/20 0052 09/18/20 0214  NA 133* 132* 131* 131* 127*  K 5.2* 4.9 3.7 4.6 5.8*  CL 94* 95* 93* 92* 93*  CO2 '25 26 28 27 24  '$ GLUCOSE 169* 198* 124* 105* 137*  BUN 33* 37* 18 31* 42*  CREATININE 8.29* 8.58* 5.62* 7.34* 9.25*  CALCIUM 8.3* 9.0 7.9* 8.1* 8.2*  PHOS 6.0* 6.4* 5.0* 6.3* 5.8*   Liver Function Tests: Recent Labs  Lab 09/14/20 0731 09/15/20 0159 09/15/20 0843 09/16/20 0119 09/17/20 0052 09/18/20 0214  AST 13*  --   --   --   --   --   ALT 9  --   --   --   --   --   ALKPHOS 48  --   --   --   --   --   BILITOT 0.8  --   --   --   --   --   PROT 7.5  --   --   --   --   --   ALBUMIN 2.6* 2.5* 2.9* 2.3* 2.4* 2.3*   No results for input(s): LIPASE, AMYLASE in the last 168 hours. No results for input(s): AMMONIA in the last 168 hours. CBC: Recent Labs  Lab 09/14/20 0731 09/15/20 0159 09/15/20 0844 09/17/20 0052 09/18/20 0214  WBC 13.5* 17.6* 21.9* 12.2* 17.4*  HGB 9.9* 9.6* 10.4* 8.5* 8.9*  HCT 32.7* 31.7* 33.8* 27.6* 27.9*  MCV 83.6 82.8 82.2 82.1 79.9*  PLT 486* 443* 572* 496* 499*   Cardiac Enzymes: No results for input(s): CKTOTAL, CKMB, CKMBINDEX, TROPONINI in the last 168 hours. BNP: Invalid input(s): POCBNP CBG: Recent Labs  Lab 09/17/20 0610 09/17/20 1138 09/17/20 1627 09/17/20 2116 09/18/20 0619  GLUCAP 106* 104* 166* 141* 109*   D-Dimer No results for input(s): DDIMER in the last 72 hours. Hgb A1c No results for input(s): HGBA1C in the last 72 hours. Lipid Profile No results for input(s): CHOL, HDL, LDLCALC, TRIG, CHOLHDL, LDLDIRECT in the last 72  hours. Thyroid function studies No results for input(s): TSH, T4TOTAL, T3FREE, THYROIDAB in the last 72 hours.  Invalid input(s): FREET3 Anemia work up No results for input(s): VITAMINB12, FOLATE, FERRITIN, TIBC, IRON, RETICCTPCT in the last 72 hours. Urinalysis    Component Value Date/Time   COLORURINE YELLOW 06/20/2020 [place] 06/20/2020 0927   LABSPEC 1.012 06/20/2020 0927   PHURINE 5.0 06/20/2020 0927   GLUCOSEU NEGATIVE 06/20/2020 0927   HGBUR SMALL (A) 06/20/2020 0927   BILIRUBINUR NEGATIVE 06/20/2020 0927   KETONESUR NEGATIVE 06/20/2020 0927   PROTEINUR 30 (A) 06/20/2020 0927   NITRITE NEGATIVE 06/20/2020 0927   LEUKOCYTESUR NEGATIVE 06/20/2020 0927   Sepsis Labs Invalid input(s): PROCALCITONIN,  WBC,  LACTICIDVEN Microbiology Recent Results (from the past 240 hour(s))  SARS CORONAVIRUS 2 (TAT 6-24 HRS) Nasopharyngeal Nasopharyngeal Swab     Status: None   Collection Time: 09/13/20  2:29 PM   Specimen: Nasopharyngeal Swab  Result Value Ref Range Status   SARS Coronavirus 2 NEGATIVE NEGATIVE Final    Comment: (NOTE) SARS-CoV-2 target nucleic acids are NOT DETECTED.  The SARS-CoV-2 RNA is generally detectable in upper and lower respiratory specimens during the acute phase of infection. Negative results do not preclude SARS-CoV-2 infection, do not rule out co-infections with other pathogens, and should not be used as the sole basis for treatment or other patient management decisions. Negative results must be combined with clinical observations, patient history, and epidemiological information. The expected result is Negative.  Fact Sheet for Patients: SugarRoll.be  Fact Sheet for Healthcare Providers: https://www.woods-mathews.com/  This test is not yet  approved or cleared by the [place] and  has been authorized for detection and/or diagnosis of SARS-CoV-2 by FDA under an Emergency Use  Authorization (EUA). This EUA will remain  in effect (meaning this test can be used) for the duration of the COVID-19 declaration under Se ction 564(b)(1) of the Act, 21 U.S.C. section 360bbb-3(b)(1), unless the authorization is terminated or revoked sooner.  Performed at [facility], [place] [address] [place], [place] [address]      Time coordinating discharge: Over 30 minutes  SIGNED:   Phillips Climes, MD  [facility] 09/18/2020, 11:11 AM Pager   If 7PM-7AM, please contact night-coverage [email or web address] Password [email or web address]

## 2020-09-18 NOTE — TOC Transition Note (Addendum)
Transition of Care (TOC) - CM/SW Discharge Note Marvetta Gibbons RN, BSN Transitions of Care Unit 4E- RN Case Manager See Treatment Team for direct phone #    Patient Details  Name: Christy Zhang MRN: [id number] Date of Birth: [birth year]  Transition of Care [facility]) CM/SW Contact:  Dawayne Patricia, RN Phone Number: 09/18/2020, 12:49 PM   Clinical Narrative:    Pt stable for transition home today, orders placed for HHRN/PT/OT. Pt has had HD this AM.  CM spoke with pt on return to room from HD. Per pt she plans to return home with family. She states she has all needed DME including slide board and lift. Wheelchair at the bedside. Pt does however state that her wheelchair needs to be looked at as the brakes are not working properly. Will call [employer] who supplied the w/c to come out to home and look at w/c and service if needed.   Choice offered for [facility] services RN/PT/OT- with list provided Per CMS guidelines from medicare.gov website with star ratings (copy placed in shadow chart)- pt states she likes to use [facility] for [facility]-  Address, phone #s and PCP all confirmed with pt in epic.   Call made to [employer] for request regarding wheelchair service needs.   Call made to [facility] with [facility] for [facility] referral per Ramond Marrow pt may still be active with them and HH would be either a resumption of services or they would accept her back as new referral- either way they would service her for [facility] needs. Requested [place] review lift with her and family on visit.   1345- notified by CSW that OT recommending a drop arm commode- wide. Call made to [employer] to see if pt qualifies for this DME at this time. Per [employer] pt has received a commode in the last 5 years and would not qualify under insurance, would have to private pay at this time. Est. Cost for a wide drop arm commode is $140. Pt informed of this, and states she wants to think about it. Will let staff know if she want to purchase drop arm commode at this time prior to  discharge.  Per pt [employer] has come by room and looked at wheelchair- they told her the old one could not be fixed and changed out the W/C - this is at no cost to the patient per [employer].       Final next level of care: Home w Home Health Services Barriers to Discharge: No Barriers Identified   Patient Goals and CMS Choice Patient states their goals for this hospitalization and ongoing recovery are:: return home with family   Choice offered to / list presented to : Patient  Discharge Placement                 home with [facility]      Discharge Plan and Services   Discharge Planning Services: CM Consult Post Acute Care Choice: Home Health          DME Arranged: N/A DME Agency: NA       HH Arranged: RN,PT,OT [place] Agency: [place] ([facility]) Date HH Agency Contacted: 09/18/20 Time [place]: [place] Representative spoke with at [place]: [place] ([place]) Interventions     Readmission Risk Interventions Readmission Risk Prevention Plan 09/18/2020  Transportation Screening Complete  Medication Review [employer]) Complete  PCP or Specialist appointment within 3-5 days of discharge Complete  HRI or Home Care Consult  Complete  SW Recovery Care/Counseling Consult Not Complete  Palliative Care Screening Not Applicable  Skilled Nursing Facility Not Applicable  Some recent data might be hidden

## 2020-09-18 NOTE — Progress Notes (Signed)
[facility] Progress Note   Dialysis Orders: [facility] - MWF 4 hrs 400/500 97 kg 2.0K/2.5 Ca 16g needles Add UFP 2 on discharge -Heparin 1000 units initial bolus-heparin pump 1000 units/hr stop 1 hour prior to end of tx -> no heparin here in hosp on hd.  -Epogen 14000 units IV TIW -Hectorol 1.5 mcg IV TIW  Assessment/ Plan:   1. PAD w/ Non healing TMA- s/p BKA 09/14/2020 by Dr. Donzetta Matters. Pain better controlled. Drainage resolved 2. ESRD- HD MWF. Continue MWF schedule Seen on HD 2/2  UF 2L net UF as tolerated 114/82  Rt AVG No heparin post surgery.   3. Hypertension/volume- BP variable, monitor. Does not appear volume overloaded. UF as tolerated. Will need lower dry on d/c after amputation.  4. Anemiaof CKD-10.4--> 8.5.  Add Aranesp for Monday's HD.   5. Secondary Hyperparathyroidism -Ca/PO4 at goal -> phos 6.3 -> 5.8.  Continue binders. Renvela 800 mg  3 tabs PO TID AC 2 with snacks.  6. Nutrition- Renal diet w/fluids restrictions. 7. Numbness finger of R hand distal to AVG at end of HD session: Fingers slightly cooler than L hand initially. Has full use of R hand. Changed to UFP 2 with HD 09/15/20 with resolution of symptoms.    Subjective:   No issues overnight.  Feeling well this AM; seen on HD.  Awaiting placement.      Objective:   BP 107/83   Pulse 73   Temp 98.4 F (36.9 C) (Oral)   Resp 20   Ht '5\' 3"'$  (1.6 m)   Wt 96.2 kg   LMP  (LMP Unknown)   SpO2 97%   BMI 37.57 kg/m   Intake/Output Summary (Last 24 hours) at 09/18/2020 0839 Last data filed at 09/17/2020 1500 Gross per 24 hour  Intake 580 ml  Output --  Net 580 ml   Weight change: 1.2 kg  Physical Exam: General:NAD Heart:S1,S2, soft murmur Lungs:CTAB Abdomen:S, NT Extremities:Bilateral BKA R BKA with ACE wrap--> clean  Dialysis Access:R AVG + T/B. R hand warm, well-perfused with pink nail beds.    Imaging: No results found.  Labs: BMET Recent Labs  Lab  09/14/20 0731 09/15/20 0159 09/15/20 0843 09/16/20 0119 09/17/20 0052 09/18/20 0214  NA 134* 133* 132* 131* 131* 127*  K 4.3 5.2* 4.9 3.7 4.6 5.8*  CL 96* 94* 95* 93* 92* 93*  CO2 '29 25 26 28 27 24  '$ GLUCOSE 99 169* 198* 124* 105* 137*  BUN 18 33* 37* 18 31* 42*  CREATININE 7.38* 8.29* 8.58* 5.62* 7.34* 9.25*  CALCIUM 8.5* 8.3* 9.0 7.9* 8.1* 8.2*  PHOS  --  6.0* 6.4* 5.0* 6.3* 5.8*   CBC Recent Labs  Lab 09/15/20 0159 09/15/20 0844 09/17/20 0052 09/18/20 0214  WBC 17.6* 21.9* 12.2* 17.4*  HGB 9.6* 10.4* 8.5* 8.9*  HCT 31.7* 33.8* 27.6* 27.9*  MCV 82.8 82.2 82.1 79.9*  PLT 443* 572* 496* 499*    Medications:    . amLODipine  5 mg Oral QHS  . aspirin EC  81 mg Oral QHS  . atorvastatin  40 mg Oral QHS  . Chlorhexidine Gluconate Cloth  6 each Topical Q0600  . clopidogrel  75 mg Oral QPM  . darbepoetin (ARANESP) injection - DIALYSIS  100 mcg Intravenous Q Mon-HD  . gabapentin  300 mg Oral QHS  . heparin  5,000 Units Subcutaneous Q8H  . insulin aspart  0-6 Units Subcutaneous TID WC  . insulin detemir  16 Units  Subcutaneous Daily  . metoprolol succinate  50 mg Oral QHS  . multivitamin  1 tablet Oral QHS  . pantoprazole  40 mg Oral Daily  . polyethylene glycol  17 g Oral Daily  . senna-docusate  1 tablet Oral BID  . sevelamer carbonate  1,600 mg Oral With snacks  . sevelamer carbonate  2,400 mg Oral TID WC  . sodium chloride flush  3 mL Intravenous Q12H      Otelia Santee, MD 09/18/2020, 8:39 AM

## 2020-09-18 NOTE — Progress Notes (Signed)
  Progress Note    09/18/2020 7:52 AM 4 Days Post-Op  Subjective:  No complaints   Vitals:   09/17/20 2331 09/18/20 0328  BP: (!) 120/26 (!) 122/34  Pulse: 98 86  Resp: 18 18  Temp: 99.4 F (37.4 C) 99.4 F (37.4 C)  SpO2: 99% 94%    Physical Exam: Incisions:  R BKA c/d/i   CBC    Component Value Date/Time   WBC 17.4 (H) 09/18/2020 0214   RBC 3.49 (L) 09/18/2020 0214   HGB 8.9 (L) 09/18/2020 0214   HGB 9.2 (L) 07/20/2020 1438   HCT 27.9 (L) 09/18/2020 0214   HCT 31.5 (L) 07/20/2020 1438   PLT 499 (H) 09/18/2020 0214   PLT 716 (H) 07/20/2020 1438   MCV 79.9 (L) 09/18/2020 0214   MCV 85 07/20/2020 1438   MCH 25.5 (L) 09/18/2020 0214   MCHC 31.9 09/18/2020 0214   RDW 16.9 (H) 09/18/2020 0214   RDW 16.1 (H) 07/20/2020 1438   LYMPHSABS 1.7 08/18/2020 0123   LYMPHSABS 2.0 07/20/2020 1438   MONOABS 1.1 (H) 08/18/2020 0123   EOSABS 1.0 (H) 08/18/2020 0123   EOSABS 0.5 (H) 07/20/2020 1438   BASOSABS 0.0 08/18/2020 0123   BASOSABS 0.1 07/20/2020 1438    BMET    Component Value Date/Time   NA 127 (L) 09/18/2020 0214   NA 139 07/20/2020 1438   K 5.8 (H) 09/18/2020 0214   CL 93 (L) 09/18/2020 0214   CO2 24 09/18/2020 0214   GLUCOSE 137 (H) 09/18/2020 0214   BUN 42 (H) 09/18/2020 0214   BUN 24 07/20/2020 1438   CREATININE 9.25 (H) 09/18/2020 0214   CALCIUM 8.2 (L) 09/18/2020 0214   GFRNONAA 4 (L) 09/18/2020 0214   GFRAA 8 (L) 04/06/2020 1113    INR    Component Value Date/Time   INR 1.1 09/14/2020 0731     Intake/Output Summary (Last 24 hours) at 09/18/2020 0752 Last data filed at 09/17/2020 1500 Gross per 24 hour  Intake 580 ml  Output --  Net 580 ml     Assessment/Plan:  65 y.o. female is s/p right below knee amputation  4 Days Post-Op  - Bleeding from mid incision resolved - Retention sock ordered - Ok for d/c from vascular standpoint; office will arrange staple removal in 4-6 weeks   Dagoberto Ligas, PA-C [facility] [phone] 09/18/2020 7:52 AM

## 2020-09-18 NOTE — Progress Notes (Signed)
Occupational Therapy Treatment Patient Details Name: Christy Zhang MRN: [id number] DOB: [birth year] Today's Date: 09/18/2020    History of present illness Pt adm on 5/19 for rt BKA due to non healing transmetatarsal amputation.  PMH includes L BKA 06/08/20, rt transmet amputation, IDDM, ESRD on HD MWF, HTN, PAD.   OT comments  Pt progressing towards OT goals though session limited by return to unit s/p HD and arrival of lunch. Pt requesting assist for scooting up in bed on entry in prep for lunch. Pt Min A for using bedrails to scoot up in bed. Provided UE HEP with green theraband to maximize UB strength for functional transfers with pt able to return demo well. Collaborated with pt on safe, compensatory strategies for LB ADLs with pt demo good awareness of deficits and assist needed. Of note, pt reports her bathroom at home is not wheelchair accessible and does not have a drop arm BSC. Pt reports plan to use bedpan initially, but would ideally benefit from larger drop arm BSC to maximize toileting independence.    Follow Up Recommendations  Home health OT (direct assist for ADLs/transfers)    Equipment Recommendations  Other (comment) (bariatric drop arm BSC)    Recommendations for Other Services      Precautions / Restrictions Precautions Precautions: Fall;Other (comment) Precaution Comments: hx of L BKA, new R BKA this admission Restrictions Weight Bearing Restrictions: Yes RLE Weight Bearing: Non weight bearing LLE Weight Bearing: Non weight bearing Other Position/Activity Restrictions: B BKA       Mobility Bed Mobility Overal bed mobility: Needs Assistance             General bed mobility comments: On entry, pt requesting assist to scoot up in bed for lunch, overall Min A with use of bed rails to scoot up in bed    Transfers                 General transfer comment: pt declined due to desire to eat lunch first    Balance                                            ADL either performed or assessed with clinical judgement   ADL Overall ADL's : Needs assistance/impaired                                       General ADL Comments: Collaborated and educated pt on compensatory strategies for LB ADLs with pt demo awareness of deficits and opting for conservative mgmt of ADLs (getting dressed in bed, using bedpan for toileting initially as bathroom is not wheelchair accessible). Pt reports during previous admission, was unable to obtain drop arm BSC (with transmet amp per pt) though OT educated that with B BKA, drop arm BSC would be optimal for progression of toileting independence - collab with SW for further info on this     Vision   Vision Assessment?: No apparent visual deficits   Perception     Praxis      Cognition Arousal/Alertness: Awake/alert Behavior During Therapy: WFL for tasks assessed/performed Overall Cognitive Status: Within Functional Limits for tasks assessed  Exercises Exercises: General Upper Extremity General Exercises - Upper Extremity Shoulder Flexion: Strengthening;Both;5 reps;Theraband Theraband Level (Shoulder Flexion): Level 3 (Green) Shoulder Horizontal ABduction: Strengthening;Both;5 reps;Theraband Theraband Level (Shoulder Horizontal Abduction): Level 3 (Green) Elbow Flexion: Strengthening;Both;5 reps;Theraband Theraband Level (Elbow Flexion): Level 3 (Green) Elbow Extension: Strengthening;Both;5 reps;Theraband Theraband Level (Elbow Extension): Level 3 (Green)   Shoulder Instructions       General Comments HR/O2 WFL on RA    Pertinent Vitals/ Pain       Pain Assessment: No/denies pain  Home Living                                          Prior Functioning/Environment              Frequency  Min 2X/week        Progress Toward Goals  OT Goals(current goals can now be found in  the care plan section)  Progress towards OT goals: Progressing toward goals  Acute Rehab OT Goals Patient Stated Goal: Hoping to transition home. OT Goal Formulation: With patient Time For Goal Achievement: 09/30/20 Potential to Achieve Goals: Fair ADL Goals Pt Will Perform Grooming: with set-up;sitting Pt Will Perform Upper Body Bathing: with set-up;sitting Pt Will Perform Lower Body Bathing: with min assist;bed level Pt Will Perform Upper Body Dressing: with supervision;sitting Pt Will Perform Lower Body Dressing: with min assist;bed level Pt Will Transfer to Toilet: with min guard assist;with transfer board;bedside commode  Plan Discharge plan remains appropriate;Equipment recommendations need to be updated    Co-evaluation                 AM-PAC OT "6 Clicks" Daily Activity     Outcome Measure   Help from another person eating meals?: None Help from another person taking care of personal grooming?: None Help from another person toileting, which includes using toliet, bedpan, or urinal?: A Lot Help from another person bathing (including washing, rinsing, drying)?: A Lot Help from another person to put on and taking off regular upper body clothing?: A Little Help from another person to put on and taking off regular lower body clothing?: A Lot 6 Click Score: 17    End of Session    OT Visit Diagnosis: Muscle weakness (generalized) (M62.81);Pain Pain - Right/Left: Right Pain - part of body: Leg   Activity Tolerance Patient tolerated treatment well   Patient Left in bed;with call bell/phone within reach;with bed alarm set   Nurse Communication Mobility status        Time: [id number] OT Time Calculation (min): 11 min  Charges: OT General Charges $OT Visit: 1 Visit OT Treatments $Therapeutic Activity: 8-22 mins  Malachy Chamber, OTR/L Acute Rehab Services Office: [phone]   Layla Maw 09/18/2020, 1:33 PM

## 2020-09-18 NOTE — Progress Notes (Signed)
Discharge instructions provided to the patient. Medications, sire care, and follow-up appointments reviewed. All questions answered. IV removed. Patient to be escorted home by her sister.   Gailen Shelter RN

## 2020-09-18 NOTE — Progress Notes (Signed)
PT Cancellation Note  Patient Details Name: Christy Zhang MRN: [id number] DOB: [birth year]   Cancelled Treatment:    Reason Eval/Treat Not Completed: (P) Patient at procedure or test/unavailable (pt at HD dept.) Will continue efforts per PT POC as schedule permits.   Kara Pacer Kari Kerth 09/18/2020, 9:56 AM

## 2020-09-19 DIAGNOSIS — E1151 Type 2 diabetes mellitus with diabetic peripheral angiopathy without gangrene: Secondary | ICD-10-CM | POA: Diagnosis not present

## 2020-09-19 DIAGNOSIS — I70201 Unspecified atherosclerosis of native arteries of extremities, right leg: Secondary | ICD-10-CM | POA: Diagnosis not present

## 2020-09-19 DIAGNOSIS — Z89511 Acquired absence of right leg below knee: Secondary | ICD-10-CM | POA: Diagnosis not present

## 2020-09-19 DIAGNOSIS — Z4781 Encounter for orthopedic aftercare following surgical amputation: Secondary | ICD-10-CM | POA: Diagnosis not present

## 2020-09-19 DIAGNOSIS — E1122 Type 2 diabetes mellitus with diabetic chronic kidney disease: Secondary | ICD-10-CM | POA: Diagnosis not present

## 2020-09-19 DIAGNOSIS — Z4801 Encounter for change or removal of surgical wound dressing: Secondary | ICD-10-CM | POA: Diagnosis not present

## 2020-09-19 NOTE — Consult Note (Signed)
   [facility] Inpatient Consult   09/19/2020  KAIA KUNKA [birth year] [id number]   [facility] [ACO] Patient: Medicare  This patient is also in an Warden/ranger with chronic disease management Embedded Care Management team.  Plan: Notification sent to the [facility] Management team of admission and made aware of follow up.    Please contact for further questions,  Natividad Brood, RN BSN [facility] Liaison  [phone] business mobile phone Toll free office [phone]  Fax number: [phone] [email or web address]

## 2020-09-20 DIAGNOSIS — N186 End stage renal disease: Secondary | ICD-10-CM | POA: Diagnosis not present

## 2020-09-20 DIAGNOSIS — D631 Anemia in chronic kidney disease: Secondary | ICD-10-CM | POA: Diagnosis not present

## 2020-09-20 DIAGNOSIS — N2581 Secondary hyperparathyroidism of renal origin: Secondary | ICD-10-CM | POA: Diagnosis not present

## 2020-09-20 DIAGNOSIS — Z992 Dependence on renal dialysis: Secondary | ICD-10-CM | POA: Diagnosis not present

## 2020-09-21 ENCOUNTER — Ambulatory Visit: Payer: Medicare Other | Admitting: Licensed Clinical Social Worker

## 2020-09-21 DIAGNOSIS — E1165 Type 2 diabetes mellitus with hyperglycemia: Secondary | ICD-10-CM | POA: Diagnosis not present

## 2020-09-21 DIAGNOSIS — N2889 Other specified disorders of kidney and ureter: Secondary | ICD-10-CM

## 2020-09-21 DIAGNOSIS — E785 Hyperlipidemia, unspecified: Secondary | ICD-10-CM

## 2020-09-21 DIAGNOSIS — J4530 Mild persistent asthma, uncomplicated: Secondary | ICD-10-CM

## 2020-09-21 DIAGNOSIS — Z992 Dependence on renal dialysis: Secondary | ICD-10-CM | POA: Diagnosis not present

## 2020-09-21 DIAGNOSIS — S88112A Complete traumatic amputation at level between knee and ankle, left lower leg, initial encounter: Secondary | ICD-10-CM

## 2020-09-21 DIAGNOSIS — I151 Hypertension secondary to other renal disorders: Secondary | ICD-10-CM | POA: Diagnosis not present

## 2020-09-21 DIAGNOSIS — Z4801 Encounter for change or removal of surgical wound dressing: Secondary | ICD-10-CM | POA: Diagnosis not present

## 2020-09-21 DIAGNOSIS — Z4781 Encounter for orthopedic aftercare following surgical amputation: Secondary | ICD-10-CM | POA: Diagnosis not present

## 2020-09-21 DIAGNOSIS — Z89511 Acquired absence of right leg below knee: Secondary | ICD-10-CM | POA: Diagnosis not present

## 2020-09-21 DIAGNOSIS — E1151 Type 2 diabetes mellitus with diabetic peripheral angiopathy without gangrene: Secondary | ICD-10-CM | POA: Diagnosis not present

## 2020-09-21 DIAGNOSIS — E1169 Type 2 diabetes mellitus with other specified complication: Secondary | ICD-10-CM | POA: Diagnosis not present

## 2020-09-21 DIAGNOSIS — Z794 Long term (current) use of insulin: Secondary | ICD-10-CM | POA: Diagnosis not present

## 2020-09-21 DIAGNOSIS — E1122 Type 2 diabetes mellitus with diabetic chronic kidney disease: Secondary | ICD-10-CM

## 2020-09-21 DIAGNOSIS — N185 Chronic kidney disease, stage 5: Secondary | ICD-10-CM | POA: Diagnosis not present

## 2020-09-21 DIAGNOSIS — N186 End stage renal disease: Secondary | ICD-10-CM | POA: Diagnosis not present

## 2020-09-21 DIAGNOSIS — D631 Anemia in chronic kidney disease: Secondary | ICD-10-CM

## 2020-09-21 DIAGNOSIS — I70201 Unspecified atherosclerosis of native arteries of extremities, right leg: Secondary | ICD-10-CM | POA: Diagnosis not present

## 2020-09-21 NOTE — Patient Instructions (Signed)
Visit Information  PATIENT GOALS: Goals Addressed            This Visit's Progress   . Protect My Health;Manage medical conditions faced       Timeframe:  Short-Term Goal Priority:  High Progress: On Track Start Date:      09/21/20                       Expected End Date:    12/22/20                Follow Up Date 11/06/20   Protect My Health (Patient). Patient needs to learn coping skills to help her manage medical conditions faced (Has diabetes, has weekly Dialysis treatments, and recently had a Left BKA completed  Why is this important?    Screening tests can find diseases early when they are easier to treat.   Your doctor or nurse will talk with you about which tests are important for you.   Getting shots for common diseases like the flu and shingles will help prevent them.     Patient Strengths: Has support from daughter, Ledon Snare of client is important to client Client is strong advocate for herself  Patient Deficits: Challenges with Managing Diabetes Challenges with Completing Dialysis Treatment weekly  Patient Goals:   Patient to attend scheduled medical appointments in next 30 days Patient to take medications as prescribed Patient to communicate with [facility] or LCSW as needed for support Patient to communicate regularly with daughter, [name], for support Patient to practice self care and allow time to rest and relax -  Follow Up Plan: LCSW to call client on 11/06/20 to assess client needs       Norva Riffle.Criselda Starke MSW, LCSW Licensed Clinical Social Worker [facility] Care Management [phone]

## 2020-09-21 NOTE — Chronic Care Management (AMB) (Signed)
Chronic Care Management    Clinical Social Work Note  09/21/2020 Name: Christy Zhang MRN: [id number] DOB: 03-Apr-1956  Christy Zhang is a 65 y.o. year old female who is a primary care patient of [name], [name], MD. The CCM team was consulted to assist the patient with chronic disease management and/or care coordination needs related to: [name] .   Engaged with patient / Arnette Felts, daughter of client, by telephone for follow up visit in response to provider referral for social work chronic care management and care coordination services.   Consent to Services:  The patient was given information about Chronic Care Management services, agreed to services, and gave verbal consent prior to initiation of services.  Please see initial visit note for detailed documentation.   Patient agreed to services and consent obtained.   Assessment: Review of patient past medical history, allergies, medications, and health status, including review of relevant consultants reports was performed today as part of a comprehensive evaluation and provision of chronic care management and care coordination services.     SDOH (Social Determinants of Health) assessments and interventions performed:  SDOH Interventions   Flowsheet Row Most Recent Value  SDOH Interventions   Depression Interventions/Treatment  --  [informed client of LCSW support and of RNCM support]       Advanced Directives Status: See Vynca application for related entries.  CCM Care Plan  Allergies  Allergen Reactions  . Sulfa Antibiotics Swelling    Outpatient Encounter Medications as of 09/21/2020  Medication Sig Note  . acetaminophen (TYLENOL) 325 MG tablet Take 1-2 tablets (325-650 mg total) by mouth every 4 (four) hours as needed for mild pain.   Marland Kitchen amLODipine (NORVASC) 5 MG tablet TAKE ONE TABLET BY MOUTH AT BEDTIME (Patient taking differently: Take 5 mg by mouth daily.)   . aspirin EC 81 MG tablet Take 81 mg by  mouth at bedtime. Swallow whole.   Marland Kitchen atorvastatin (LIPITOR) 40 MG tablet TAKE ONE TABLET BY MOUTH DAILY (Patient taking differently: at bedtime.)   . clopidogrel (PLAVIX) 75 MG tablet Take 1 tablet (75 mg total) by mouth daily. (Patient taking differently: No sig reported) 09/15/2020: On hold until the procedure  . diphenhydrAMINE (BENADRYL) 25 MG tablet Take 25 mg by mouth every 6 (six) hours as needed for itching.   . gabapentin (NEURONTIN) 300 MG capsule Take 1 capsule (300 mg total) by mouth at bedtime.   . insulin detemir (LEVEMIR) 100 UNIT/ML injection Inject 0.16 mLs (16 Units total) into the skin daily.   . Insulin Syringe-Needle U-100 28G X 1/2" 1 ML MISC Use with insulin daily Dx E11.22   . metoprolol succinate (TOPROL-XL) 50 MG 24 hr tablet TAKE ONE TABLET BY MOUTH DAILY. TAKE WITH OR IMMEDIATELY FOLLOWING A MEAL (Patient taking differently: Take 50 mg by mouth at bedtime.)   . multivitamin (RENA-VIT) TABS tablet Take 1 tablet by mouth at bedtime.    Marland Kitchen oxyCODONE (OXY IR/ROXICODONE) 5 MG immediate release tablet Take 1 tablet (5 mg total) by mouth every 6 (six) hours as needed for severe pain.   . polyethylene glycol (MIRALAX / GLYCOLAX) 17 g packet Take 17 g by mouth daily. (Patient taking differently: Take 17 g by mouth daily as needed for mild constipation.)   . senna-docusate (SENOKOT-S) 8.6-50 MG tablet Take 1 tablet by mouth 2 (two) times daily. (Patient taking differently: Take 1 tablet by mouth 2 (two) times daily as needed for mild constipation.)   . sevelamer carbonate (  RENVELA) 800 MG tablet Take 1,600-2,400 mg by mouth See admin instructions. Take 2400 mg with each meal and 1600 mg with each snack   . TRULICITY 1.5 0000000 SOPN INJECT CONTENTs OF ONE PEN UNDER THE SKIN WEEKLY ON SATURDAY (Patient taking differently: Inject 1.5 mg into the skin every Saturday.)    No facility-administered encounter medications on file as of 09/21/2020.    Patient Active Problem List   Diagnosis  Date Noted  . S/P BKA (below knee amputation) unilateral, right ([place]) 09/14/2020  . Diabetic foot infection ([place])   . Medication monitoring encounter   . Pressure injury of skin 08/11/2020  . Thrombocytosis 08/09/2020  . Osteomyelitis of right foot ([place]) 08/08/2020  . Leucocytosis 07/05/2020  . Below-knee amputation of left lower extremity ([place]) 06/16/2020  . S/P BKA (below knee amputation) unilateral ([place]) 06/08/2020  . Ischemic ulcer of toe of right foot ([place]) 04/13/2020  . PVD (peripheral vascular disease) ([place]) 04/13/2020  . ESRD on hemodialysis ([place]) 01/27/2020  . Abdominal wall cellulitis   . Panniculitis 12/29/2019  . Osteomyelitis ([place]) 10/15/2019  . Osteomyelitis of third toe of right foot ([place]) 10/14/2019  . ESRD (end stage renal disease) ([place])   . Asthma, mild persistent 07/25/2015  . Hypertension secondary to other renal disorders 07/25/2015  . Controlled type 2 diabetes mellitus with complication, with long-term current use of insulin ([place]) 03/30/2015  . Anemia in chronic kidney disease 02/17/2015  . 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 Essential (primary) hypertension 06/24/2014  . Heart failure ([place]) 06/24/2014  . Obesity, Class III, BMI 40-49.9 (morbid obesity) ([place]) 06/24/2014    Conditions to be addressed/monitored: Monitor client management of medical conditions  Care Plan : LCSW Care Plan  Updates made by Katha Cabal, LCSW since 09/21/2020 12:00 AM    Problem: Coping Skills (General Plan of Care)     Goal: Client will learn coping skills to help her manage medical issues and condtions faced by client   Start Date: 09/21/2020  Expected End Date: 12/22/2020  This Visit's Progress: On track  Recent Progress: Not on track  Priority: High  Note:   Current barriers:   Mobility issues (recent BKA (Left) Challenges in managing multiple medical conditions Anxiety and Stress related to functional changes due to recent  BKA  Clinical Goals:  patient will work with SW in next 30 days to address concerns related to client management of current medical conditions patient will attend all scheduled medical appointments in next 30 days  as evidenced by patient report and care team review of appointment completion in EMR:  Client will talk with LCSW in next 30 days about community resources of assistance for client  Clinical Interventions:  . Collaboration with Claretta Fraise, MD regarding development and update of comprehensive plan of care as evidenced by provider attestation and co-signature . Chart reviewed to assess client needs . Talked with daughter, Arnette Felts, about client needs . Talked with [name] about client recent hospitalization . Talked with [name] about  pain issues of client . Talked with [name] about DME of client (client has manual wheelchair) . Talked with [name] about upcoming client medical appointments . Talked with [name] about mood of client . Talked with [name] about family support for client (daughters are supportive) . Talked with [name] about in home care support for client ([place], [place], [place]) . Talked with [name] about sleeping issues of client . Talked with [name] about relaxation techniques of client (client  likes crossword puzzles)  Patient Strengths: Has support from daughter, Ledon Snare of client is important to client Client is strong advocate for herself  Patient Deficits: Challenges with Managing Diabetes Challenges with Completing Dialysis Treatment weekly  Patient Goals:   Patient to attend scheduled medical appointments in next 30 days Patient to take medications as prescribed Patient to communicate with RNCM or LCSW as needed for support Patient to communicate regularly with daughter, [name], for support Patient to practice self care and allow time to rest and relax -  Follow Up Plan: LCSW to call client on 11/06/20 to assess client needs      Norva Riffle.Jacy Brocker MSW, LCSW Licensed Clinical Social Worker [facility] Care Management [phone]

## 2020-09-22 ENCOUNTER — Ambulatory Visit: Payer: Medicare Other | Admitting: *Deleted

## 2020-09-22 DIAGNOSIS — Z794 Long term (current) use of insulin: Secondary | ICD-10-CM

## 2020-09-22 DIAGNOSIS — Z89511 Acquired absence of right leg below knee: Secondary | ICD-10-CM

## 2020-09-22 DIAGNOSIS — N2889 Other specified disorders of kidney and ureter: Secondary | ICD-10-CM

## 2020-09-22 DIAGNOSIS — J4530 Mild persistent asthma, uncomplicated: Secondary | ICD-10-CM

## 2020-09-22 DIAGNOSIS — Z992 Dependence on renal dialysis: Secondary | ICD-10-CM

## 2020-09-22 DIAGNOSIS — E1169 Type 2 diabetes mellitus with other specified complication: Secondary | ICD-10-CM

## 2020-09-22 DIAGNOSIS — N186 End stage renal disease: Secondary | ICD-10-CM | POA: Diagnosis not present

## 2020-09-22 DIAGNOSIS — E1122 Type 2 diabetes mellitus with diabetic chronic kidney disease: Secondary | ICD-10-CM

## 2020-09-22 DIAGNOSIS — E1165 Type 2 diabetes mellitus with hyperglycemia: Secondary | ICD-10-CM | POA: Diagnosis not present

## 2020-09-22 DIAGNOSIS — N185 Chronic kidney disease, stage 5: Secondary | ICD-10-CM

## 2020-09-22 DIAGNOSIS — I151 Hypertension secondary to other renal disorders: Secondary | ICD-10-CM | POA: Diagnosis not present

## 2020-09-22 DIAGNOSIS — N2581 Secondary hyperparathyroidism of renal origin: Secondary | ICD-10-CM | POA: Diagnosis not present

## 2020-09-22 DIAGNOSIS — D631 Anemia in chronic kidney disease: Secondary | ICD-10-CM

## 2020-09-22 DIAGNOSIS — E785 Hyperlipidemia, unspecified: Secondary | ICD-10-CM | POA: Diagnosis not present

## 2020-09-22 NOTE — Chronic Care Management (AMB) (Signed)
Chronic Care Management   CCM RN Visit Note  09/22/2020 Name: Christy Zhang MRN: [id number] DOB: 01-09-1956  Subjective: Christy Zhang is a 65 y.o. year old female who is a primary care patient of [name], [name], MD. The care management team was consulted for assistance with disease management and care coordination needs.    Engaged with patient's daughter, Christy Zhang, by telephone for follow up visit in response to provider referral for case management and/or care coordination services.   Consent to Services:  The patient was given information about Chronic Care Management services, agreed to services, and gave verbal consent prior to initiation of services.  Please see initial visit note for detailed documentation.   Patient agreed to services and verbal consent obtained.   Assessment: Review of patient past medical history, allergies, medications, health status, including review of consultants reports, laboratory and other test data, was performed as part of comprehensive evaluation and provision of chronic care management services.   SDOH (Social Determinants of Health) assessments and interventions performed:    CCM Care Plan  Allergies  Allergen Reactions  . Sulfa Antibiotics Swelling    Outpatient Encounter Medications as of 09/22/2020  Medication Sig Note  . acetaminophen (TYLENOL) 325 MG tablet Take 1-2 tablets (325-650 mg total) by mouth every 4 (four) hours as needed for mild pain.   Marland Kitchen amLODipine (NORVASC) 5 MG tablet TAKE ONE TABLET BY MOUTH AT BEDTIME (Patient taking differently: Take 5 mg by mouth daily.)   . aspirin EC 81 MG tablet Take 81 mg by mouth at bedtime. Swallow whole.   Marland Kitchen atorvastatin (LIPITOR) 40 MG tablet TAKE ONE TABLET BY MOUTH DAILY (Patient taking differently: at bedtime.)   . clopidogrel (PLAVIX) 75 MG tablet Take 1 tablet (75 mg total) by mouth daily. (Patient taking differently: No sig reported) 09/15/2020: On hold until the procedure  .  diphenhydrAMINE (BENADRYL) 25 MG tablet Take 25 mg by mouth every 6 (six) hours as needed for itching.   . gabapentin (NEURONTIN) 300 MG capsule Take 1 capsule (300 mg total) by mouth at bedtime.   . insulin detemir (LEVEMIR) 100 UNIT/ML injection Inject 0.16 mLs (16 Units total) into the skin daily.   . Insulin Syringe-Needle U-100 28G X 1/2" 1 ML MISC Use with insulin daily Dx E11.22   . metoprolol succinate (TOPROL-XL) 50 MG 24 hr tablet TAKE ONE TABLET BY MOUTH DAILY. TAKE WITH OR IMMEDIATELY FOLLOWING A MEAL (Patient taking differently: Take 50 mg by mouth at bedtime.)   . multivitamin (RENA-VIT) TABS tablet Take 1 tablet by mouth at bedtime.    Marland Kitchen oxyCODONE (OXY IR/ROXICODONE) 5 MG immediate release tablet Take 1 tablet (5 mg total) by mouth every 6 (six) hours as needed for severe pain.   . polyethylene glycol (MIRALAX / GLYCOLAX) 17 g packet Take 17 g by mouth daily. (Patient taking differently: Take 17 g by mouth daily as needed for mild constipation.)   . senna-docusate (SENOKOT-S) 8.6-50 MG tablet Take 1 tablet by mouth 2 (two) times daily. (Patient taking differently: Take 1 tablet by mouth 2 (two) times daily as needed for mild constipation.)   . sevelamer carbonate (RENVELA) 800 MG tablet Take 1,600-2,400 mg by mouth See admin instructions. Take 2400 mg with each meal and 1600 mg with each snack   . TRULICITY 1.5 0000000 SOPN INJECT CONTENTs OF ONE PEN UNDER THE SKIN WEEKLY ON SATURDAY (Patient taking differently: Inject 1.5 mg into the skin every Saturday.)    No facility-administered  encounter medications on file as of 09/22/2020.    Patient Active Problem List   Diagnosis Date Noted  . S/P BKA (below knee amputation) unilateral, right ([place]) 09/14/2020  . Diabetic foot infection ([place])   . Medication monitoring encounter   . Pressure injury of skin 08/11/2020  . Thrombocytosis 08/09/2020  . Osteomyelitis of right foot ([place]) 08/08/2020  . Leucocytosis 07/05/2020  . Below-knee  amputation of left lower extremity ([place]) 06/16/2020  . S/P BKA (below knee amputation) unilateral ([place]) 06/08/2020  . Ischemic ulcer of toe of right foot ([place]) 04/13/2020  . PVD (peripheral vascular disease) ([place]) 04/13/2020  . ESRD on hemodialysis ([place]) 01/27/2020  . Abdominal wall cellulitis   . Panniculitis 12/29/2019  . Osteomyelitis ([place]) 10/15/2019  . Osteomyelitis of third toe of right foot ([place]) 10/14/2019  . ESRD (end stage renal disease) ([place])   . Asthma, mild persistent 07/25/2015  . Hypertension secondary to other renal disorders 07/25/2015  . Controlled type 2 diabetes mellitus with complication, with long-term current use of insulin ([place]) 03/30/2015  . Anemia in chronic kidney disease 02/17/2015  . 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 Essential (primary) hypertension 06/24/2014  . Heart failure ([place]) 06/24/2014  . Obesity, Class III, BMI 40-49.9 (morbid obesity) ([place]) 06/24/2014    Conditions to be addressed/monitored:HTN, DMII, ESRD and bilateral BKA  Care Plan : RNCM: Amputation  Updates made by Ilean China, RN since 09/22/2020 12:00 AM    Problem: Right BKA   Priority: Medium    Long-Range Goal: Maintain Independence and Quality of Life s/p Amputation   Start Date: 09/07/2020  This Visit's Progress: On track  Recent Progress: On track  Priority: Medium  Note:   Current Barriers:  . Care Coordination needs related to Right BKA in a patient with diabetes, ESRD, and Left BKA . Chronic Disease Management support and education needs related to wound care . Unable to drive on her own . Unable to perform IADLs independently  Nurse Case Manager Clinical Goal(s):  . patient will verbalize understanding of plan for wound care  . patient will work with vascular surgeon to address needs related to medical management of right BKA . patient will meet with RN Care Manager to address wound management and physical challenges related  to bilateral BKA s/p right BKA . Patient will work with home health nursing, PT, OT for wound care and improvement in strength and function  Interventions:  . 1:1 collaboration with Claretta Fraise, MD regarding development and update of comprehensive plan of care as evidenced by provider attestation and co-signature . Inter-disciplinary care team collaboration (see longitudinal plan of care) . Chart reviewed including relevant office notes and lab results . Medications reviewed and discussed importance of compliance . Talked with daughter, Christy Zhang, as patient was at dialysis at time of call . Assessed for depression or anxiety related to recent amputation and resulting change in mobility and ability to perform ADLs o Daughter says that she is doing well and has a positive outlook o Previously talked with patient regarding this and she seemed very positive and felt that she will still be able to manage ADLs after some work with physical and occupational therapy . Discussed mobility o Patient is using a wheelchair . Discussed family/social support o 2 daughters that provide support . Assessed for home health services o Patient is receiving nursing services, PT and OT in the home . Encouraged to talk with LCSW as needed regarding any psychosocial concerns .  Asked if daughter had any additional questions or concerns and encouraged to reach out to [facility] team as needed  Self Care Activities:  . Self administers medications as prescribed . Attends all scheduled provider appointments . Performs ADL's independently . Calls provider office for new concerns or questions  Patient Goals Over the next 30 days, patient will: . Work with home health nursing, PT, and OT for wound care and on improving strength and ability to perform ADLs . Call LCSW with any psychosocial concerns . Call [place] as needed with any nursing care needs or questions . Keep all medical appointments . Monitor surgical  wound for signs of infection and reach out to vascular surgeon if any develop . Seek appropriate medical attention as needed for any new or worsening symptoms . Use wheelchair for all ambulation     Follow Up Plan:  . Telephone follow up appointment with care management team member scheduled for: 6/10 RNCM, 11/06/20 LCSW . The patient has been provided with contact information for the care management team and has been advised to call with any health related questions or concerns.  . Next PCP appointment scheduled for: 10/18/20 with Dr Livia Snellen  Chong Sicilian, BSN, RN-BC [place] [facility] [place] Management Direct Dial: [phone]

## 2020-09-22 NOTE — Patient Instructions (Signed)
Visit Information  PATIENT GOALS: Goals Addressed            This Visit's Progress   . Maintain Independence and Quallity of Life s/p Right BKA       Timeframe:  Long-Range Goal Priority:  Medium Start Date:  09/22/20                           Expected End Date:                       Follow-up 10/06/20  . Work with home health nursing, PT, and OT for wound care and on improving strength and ability to perform ADLs . Call LCSW with any psychosocial concerns . Call [place] as needed with any nursing care needs or questions . Keep all medical appointments . Monitor surgical wound for signs of infection and reach out to vascular surgeon if any develop . Seek appropriate medical attention as needed for any new or worsening symptoms . Use wheelchair for all ambulation       Patient verbalizes understanding of instructions provided today and agrees to view in [place].   Follow Up Plan:  . Telephone follow up appointment with care management team member scheduled for: 6/10 RNCM, 11/06/20 LCSW . The patient has been provided with contact information for the care management team and has been advised to call with any health related questions or concerns.  . Next PCP appointment scheduled for: 10/18/20 with Dr Livia Snellen  Chong Sicilian, BSN, RN-BC [place] [facility] [place] Management Direct Dial: [phone]

## 2020-09-25 DIAGNOSIS — Z992 Dependence on renal dialysis: Secondary | ICD-10-CM | POA: Diagnosis not present

## 2020-09-25 DIAGNOSIS — N2581 Secondary hyperparathyroidism of renal origin: Secondary | ICD-10-CM | POA: Diagnosis not present

## 2020-09-25 DIAGNOSIS — D631 Anemia in chronic kidney disease: Secondary | ICD-10-CM | POA: Diagnosis not present

## 2020-09-25 DIAGNOSIS — N186 End stage renal disease: Secondary | ICD-10-CM | POA: Diagnosis not present

## 2020-09-26 DIAGNOSIS — N186 End stage renal disease: Secondary | ICD-10-CM | POA: Diagnosis not present

## 2020-09-26 DIAGNOSIS — Z992 Dependence on renal dialysis: Secondary | ICD-10-CM | POA: Diagnosis not present

## 2020-09-27 DIAGNOSIS — Z992 Dependence on renal dialysis: Secondary | ICD-10-CM | POA: Diagnosis not present

## 2020-09-27 DIAGNOSIS — E1151 Type 2 diabetes mellitus with diabetic peripheral angiopathy without gangrene: Secondary | ICD-10-CM | POA: Diagnosis not present

## 2020-09-27 DIAGNOSIS — I70201 Unspecified atherosclerosis of native arteries of extremities, right leg: Secondary | ICD-10-CM | POA: Diagnosis not present

## 2020-09-27 DIAGNOSIS — E1122 Type 2 diabetes mellitus with diabetic chronic kidney disease: Secondary | ICD-10-CM | POA: Diagnosis not present

## 2020-09-27 DIAGNOSIS — N186 End stage renal disease: Secondary | ICD-10-CM | POA: Diagnosis not present

## 2020-09-27 DIAGNOSIS — Z4781 Encounter for orthopedic aftercare following surgical amputation: Secondary | ICD-10-CM | POA: Diagnosis not present

## 2020-09-27 DIAGNOSIS — D631 Anemia in chronic kidney disease: Secondary | ICD-10-CM | POA: Diagnosis not present

## 2020-09-27 DIAGNOSIS — Z4801 Encounter for change or removal of surgical wound dressing: Secondary | ICD-10-CM | POA: Diagnosis not present

## 2020-09-27 DIAGNOSIS — D509 Iron deficiency anemia, unspecified: Secondary | ICD-10-CM | POA: Diagnosis not present

## 2020-09-27 DIAGNOSIS — N2581 Secondary hyperparathyroidism of renal origin: Secondary | ICD-10-CM | POA: Diagnosis not present

## 2020-09-27 DIAGNOSIS — Z89511 Acquired absence of right leg below knee: Secondary | ICD-10-CM | POA: Diagnosis not present

## 2020-09-27 NOTE — Patient Instructions (Addendum)
Visit Information  PATIENT GOALS: Goals Addressed            This Visit's Progress   . Monitor and Manage My Blood Sugar-Diabetes Type 2   On track    Timeframe:  Long-Range Goal Priority:  Medium Start Date:  07/27/20                           Expected End Date: 07/27/21                      Follow Up Date 09/22/20   . Check and record blood sugar twice daily . Call PCP [phone] with any readings below 70 or above 200 . Eat meals at regular intervals . Follow a DASH diet and limit sugar and simple carbohydrates . Keep all medical appointments . Call RN Care Manager as needed [phone]    Why is this important?    Checking your blood sugar at home helps to keep it from getting very high or very low.   Writing the results in a diary or log helps the doctor know how to care for you.   Your blood sugar log should have the time, date and the results.   Also, write down the amount of insulin or other medicine that you take.   Other information, like what you ate, exercise done and how you were feeling, will also be helpful.     Notes:        Patient verbalizes understanding of instructions provided today and agrees to view in [place].   Follow Up Plan:  . Telephone follow up appointment with care management team member scheduled for: 09/22/20 with RNCM . The patient has been provided with contact information for the care management team and has been advised to call with any health related questions or concerns.  . Next PCP appointment scheduled for: 10/18/20 with Dr Livia Snellen  Chong Sicilian, BSN, RN-BC [place] / [place] Management Direct Dial: [phone]

## 2020-09-27 NOTE — Chronic Care Management (AMB) (Signed)
Chronic Care Management   CCM RN Visit Note  09/07/2020 Name: Christy Zhang MRN: [id number] DOB: [birth year]  Subjective: Christy Zhang is a 65 y.o. year old female who is a primary care patient of [name], [name], MD. The care management team was consulted for assistance with disease management and care coordination needs.    Engaged with patient by telephone for follow up visit in response to provider referral for case management and/or care coordination services.   Consent to Services:  The patient was given information about Chronic Care Management services, agreed to services, and gave verbal consent prior to initiation of services.  Please see initial visit note for detailed documentation.   Patient agreed to services and verbal consent obtained.   Assessment: Review of patient past medical history, allergies, medications, health status, including review of consultants reports, laboratory and other test data, was performed as part of comprehensive evaluation and provision of chronic care management services.   SDOH (Social Determinants of Health) assessments and interventions performed:    CCM Care Plan  Allergies  Allergen Reactions  . Sulfa Antibiotics Swelling    Outpatient Encounter Medications as of 09/07/2020  Medication Sig Note  . acetaminophen (TYLENOL) 325 MG tablet Take 1-2 tablets (325-650 mg total) by mouth every 4 (four) hours as needed for mild pain.   Marland Kitchen amLODipine (NORVASC) 5 MG tablet TAKE ONE TABLET BY MOUTH AT BEDTIME (Patient taking differently: Take 5 mg by mouth daily.)   . aspirin EC 81 MG tablet Take 81 mg by mouth at bedtime. Swallow whole.   Marland Kitchen atorvastatin (LIPITOR) 40 MG tablet TAKE ONE TABLET BY MOUTH DAILY (Patient taking differently: at bedtime.)   . clopidogrel (PLAVIX) 75 MG tablet Take 1 tablet (75 mg total) by mouth daily. (Patient taking differently: No sig reported) 09/15/2020: On hold until the procedure  . diphenhydrAMINE (BENADRYL) 25  MG tablet Take 25 mg by mouth every 6 (six) hours as needed for itching.   . gabapentin (NEURONTIN) 300 MG capsule Take 1 capsule (300 mg total) by mouth at bedtime.   . insulin detemir (LEVEMIR) 100 UNIT/ML injection Inject 0.16 mLs (16 Units total) into the skin daily.   . Insulin Syringe-Needle U-100 28G X 1/2" 1 ML MISC Use with insulin daily Dx E11.22   . metoprolol succinate (TOPROL-XL) 50 MG 24 hr tablet TAKE ONE TABLET BY MOUTH DAILY. TAKE WITH OR IMMEDIATELY FOLLOWING A MEAL (Patient taking differently: Take 50 mg by mouth at bedtime.)   . multivitamin (RENA-VIT) TABS tablet Take 1 tablet by mouth at bedtime.    . polyethylene glycol (MIRALAX / GLYCOLAX) 17 g packet Take 17 g by mouth daily. (Patient taking differently: Take 17 g by mouth daily as needed for mild constipation.)   . senna-docusate (SENOKOT-S) 8.6-50 MG tablet Take 1 tablet by mouth 2 (two) times daily. (Patient taking differently: Take 1 tablet by mouth 2 (two) times daily as needed for mild constipation.)   . sevelamer carbonate (RENVELA) 800 MG tablet Take 1,600-2,400 mg by mouth See admin instructions. Take 2400 mg with each meal and 1600 mg with each snack   . TRULICITY 1.5 0000000 SOPN INJECT CONTENTs OF ONE PEN UNDER THE SKIN WEEKLY ON SATURDAY (Patient taking differently: Inject 1.5 mg into the skin every Saturday.)   . [DISCONTINUED] oxyCODONE (OXY IR/ROXICODONE) 5 MG immediate release tablet Take 1 tablet (5 mg total) by mouth every 6 (six) hours as needed for severe pain. (Patient taking differently: Take 5 mg  by mouth at bedtime as needed for severe pain.) 09/13/2020: Patient states she takes this medication at night  . [DISCONTINUED] traMADol (ULTRAM) 50 MG tablet Take 1 tablet (50 mg total) by mouth every 12 (twelve) hours as needed for moderate pain. 09/13/2020: Patient states she takes this medication at night   No facility-administered encounter medications on file as of 09/07/2020.    Patient Active Problem  List   Diagnosis Date Noted  . S/P BKA (below knee amputation) unilateral, right ([place]) 09/14/2020  . Diabetic foot infection ([place])   . Medication monitoring encounter   . Pressure injury of skin 08/11/2020  . Thrombocytosis 08/09/2020  . Osteomyelitis of right foot ([place]) 08/08/2020  . Leucocytosis 07/05/2020  . Below-knee amputation of left lower extremity ([place]) 06/16/2020  . S/P BKA (below knee amputation) unilateral ([place]) 06/08/2020  . Ischemic ulcer of toe of right foot ([place]) 04/13/2020  . PVD (peripheral vascular disease) ([place]) 04/13/2020  . ESRD on hemodialysis ([place]) 01/27/2020  . Abdominal wall cellulitis   . Panniculitis 12/29/2019  . Osteomyelitis ([place]) 10/15/2019  . Osteomyelitis of third toe of right foot ([place]) 10/14/2019  . ESRD (end stage renal disease) ([place])   . Asthma, mild persistent 07/25/2015  . Hypertension secondary to other renal disorders 07/25/2015  . Controlled type 2 diabetes mellitus with complication, with long-term current use of insulin ([place]) 03/30/2015  . Anemia in chronic kidney disease 02/17/2015  . 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 Essential (primary) hypertension 06/24/2014  . Heart failure ([place]) 06/24/2014  . Obesity, Class III, BMI 40-49.9 (morbid obesity) ([place]) 06/24/2014    Conditions to be addressed/monitored:HTN and DMII  Care Plan : RNCM:Diabetes Type 2 (Adult)  Updates made by Ilean China, RN since 09/27/2020 12:00 AM    Problem: Glycemic Management (Diabetes, Type 2)   Priority: Medium    Long-Range Goal: Glycemic Management Optimized   Start Date: 07/27/2020  This Visit's Progress: On track  Recent Progress: On track  Priority: Medium  Note:   Objective: Lab Results  Component Value Date   HGBA1C 6.5 (H) 08/09/2020   HGBA1C 8.8 (H) 08/09/2020   HGBA1C 5.8 (H) 06/08/2020   Lab Results  Component Value Date   LDLCALC 35 07/20/2020   CREATININE 9.25 (H) 09/18/2020   Current  Barriers:  . Chronic Disease Management support and education needs related to diabetes . Unable to independently drive  Nurse Case Manager Clinical Goal(s):  . patient will work with PCP to address needs related to medical management of diabetes . patient will meet with RN Care Manager to address self-management of diabetes . Patient will demonstrate ongoing self-care management as evidenced by checking and record blood sugar readings twice a day  Interventions:  . 1:1 collaboration with Claretta Fraise, MD regarding development and update of comprehensive plan of care as evidenced by provider attestation and co-signature . Inter-disciplinary care team collaboration (see longitudinal plan of care) . Evaluation of current treatment plan related to diabetes and patient's adherence to plan as established by provider. . Chart reviewed including relevant office notes and lab results . Reinforced need to continue checking and recording blood sugar readings twice a day . Discussed home readings and advised to call PCP with any readings below 70 or above 200 . Reviewed upcoming appointments . Reinforced a healthy low sodium and ADA/carb modified diet . Discussed family/social support o Has assistance from her adult daughters o One daughter just had a baby on 09/06/20 .  Discussed need for lower extremity amputation and therapuetic listening utilized.  . Provided with RN Care manager contact number and encouraged to reach out as needed . Reviewed upcoming appointments . Encouraged to reach out to LCSW with psychosocial needs  Patient Goals/Self-Care Activities Over the next 60 days, patient will: . Check and record blood sugar twice daily . Call PCP [phone] with any readings below 70 or above 200 . Eat meals at regular intervals . Follow a DASH diet and limit sugar and simple carbohydrates . Keep all medical appointments . Call RN Care Manager as needed [phone]     Follow Up Plan:   . Telephone follow up appointment with care management team member scheduled for: 09/22/20 with RNCM . The patient has been provided with contact information for the care management team and has been advised to call with any health related questions or concerns.  . Next PCP appointment scheduled for: 10/18/20 with Dr Livia Snellen  Chong Sicilian, BSN, RN-BC [place] / [place] Management Direct Dial: [phone]

## 2020-09-28 DIAGNOSIS — Z4781 Encounter for orthopedic aftercare following surgical amputation: Secondary | ICD-10-CM | POA: Diagnosis not present

## 2020-09-28 DIAGNOSIS — Z89511 Acquired absence of right leg below knee: Secondary | ICD-10-CM | POA: Diagnosis not present

## 2020-09-28 DIAGNOSIS — I70201 Unspecified atherosclerosis of native arteries of extremities, right leg: Secondary | ICD-10-CM | POA: Diagnosis not present

## 2020-09-28 DIAGNOSIS — E1122 Type 2 diabetes mellitus with diabetic chronic kidney disease: Secondary | ICD-10-CM | POA: Diagnosis not present

## 2020-09-28 DIAGNOSIS — E1151 Type 2 diabetes mellitus with diabetic peripheral angiopathy without gangrene: Secondary | ICD-10-CM | POA: Diagnosis not present

## 2020-09-28 DIAGNOSIS — Z4801 Encounter for change or removal of surgical wound dressing: Secondary | ICD-10-CM | POA: Diagnosis not present

## 2020-09-29 DIAGNOSIS — D631 Anemia in chronic kidney disease: Secondary | ICD-10-CM | POA: Diagnosis not present

## 2020-09-29 DIAGNOSIS — N186 End stage renal disease: Secondary | ICD-10-CM | POA: Diagnosis not present

## 2020-09-29 DIAGNOSIS — N2581 Secondary hyperparathyroidism of renal origin: Secondary | ICD-10-CM | POA: Diagnosis not present

## 2020-09-29 DIAGNOSIS — D509 Iron deficiency anemia, unspecified: Secondary | ICD-10-CM | POA: Diagnosis not present

## 2020-09-29 DIAGNOSIS — Z992 Dependence on renal dialysis: Secondary | ICD-10-CM | POA: Diagnosis not present

## 2020-10-02 DIAGNOSIS — N2581 Secondary hyperparathyroidism of renal origin: Secondary | ICD-10-CM | POA: Diagnosis not present

## 2020-10-02 DIAGNOSIS — D631 Anemia in chronic kidney disease: Secondary | ICD-10-CM | POA: Diagnosis not present

## 2020-10-02 DIAGNOSIS — N186 End stage renal disease: Secondary | ICD-10-CM | POA: Diagnosis not present

## 2020-10-02 DIAGNOSIS — Z992 Dependence on renal dialysis: Secondary | ICD-10-CM | POA: Diagnosis not present

## 2020-10-02 DIAGNOSIS — D509 Iron deficiency anemia, unspecified: Secondary | ICD-10-CM | POA: Diagnosis not present

## 2020-10-03 ENCOUNTER — Telehealth: Payer: Medicare Other

## 2020-10-03 DIAGNOSIS — Z89511 Acquired absence of right leg below knee: Secondary | ICD-10-CM | POA: Diagnosis not present

## 2020-10-03 DIAGNOSIS — E1122 Type 2 diabetes mellitus with diabetic chronic kidney disease: Secondary | ICD-10-CM | POA: Diagnosis not present

## 2020-10-03 DIAGNOSIS — I70201 Unspecified atherosclerosis of native arteries of extremities, right leg: Secondary | ICD-10-CM | POA: Diagnosis not present

## 2020-10-03 DIAGNOSIS — Z4781 Encounter for orthopedic aftercare following surgical amputation: Secondary | ICD-10-CM | POA: Diagnosis not present

## 2020-10-03 DIAGNOSIS — E1151 Type 2 diabetes mellitus with diabetic peripheral angiopathy without gangrene: Secondary | ICD-10-CM | POA: Diagnosis not present

## 2020-10-03 DIAGNOSIS — Z4801 Encounter for change or removal of surgical wound dressing: Secondary | ICD-10-CM | POA: Diagnosis not present

## 2020-10-04 DIAGNOSIS — Z4781 Encounter for orthopedic aftercare following surgical amputation: Secondary | ICD-10-CM | POA: Diagnosis not present

## 2020-10-04 DIAGNOSIS — Z992 Dependence on renal dialysis: Secondary | ICD-10-CM | POA: Diagnosis not present

## 2020-10-04 DIAGNOSIS — E1151 Type 2 diabetes mellitus with diabetic peripheral angiopathy without gangrene: Secondary | ICD-10-CM | POA: Diagnosis not present

## 2020-10-04 DIAGNOSIS — I70201 Unspecified atherosclerosis of native arteries of extremities, right leg: Secondary | ICD-10-CM | POA: Diagnosis not present

## 2020-10-04 DIAGNOSIS — Z4801 Encounter for change or removal of surgical wound dressing: Secondary | ICD-10-CM | POA: Diagnosis not present

## 2020-10-04 DIAGNOSIS — Z89511 Acquired absence of right leg below knee: Secondary | ICD-10-CM | POA: Diagnosis not present

## 2020-10-04 DIAGNOSIS — N186 End stage renal disease: Secondary | ICD-10-CM | POA: Diagnosis not present

## 2020-10-04 DIAGNOSIS — D631 Anemia in chronic kidney disease: Secondary | ICD-10-CM | POA: Diagnosis not present

## 2020-10-04 DIAGNOSIS — E1122 Type 2 diabetes mellitus with diabetic chronic kidney disease: Secondary | ICD-10-CM | POA: Diagnosis not present

## 2020-10-04 DIAGNOSIS — N2581 Secondary hyperparathyroidism of renal origin: Secondary | ICD-10-CM | POA: Diagnosis not present

## 2020-10-04 DIAGNOSIS — D509 Iron deficiency anemia, unspecified: Secondary | ICD-10-CM | POA: Diagnosis not present

## 2020-10-06 ENCOUNTER — Telehealth: Payer: Medicare Other | Admitting: *Deleted

## 2020-10-06 DIAGNOSIS — D631 Anemia in chronic kidney disease: Secondary | ICD-10-CM | POA: Diagnosis not present

## 2020-10-06 DIAGNOSIS — N186 End stage renal disease: Secondary | ICD-10-CM | POA: Diagnosis not present

## 2020-10-06 DIAGNOSIS — N2581 Secondary hyperparathyroidism of renal origin: Secondary | ICD-10-CM | POA: Diagnosis not present

## 2020-10-06 DIAGNOSIS — D509 Iron deficiency anemia, unspecified: Secondary | ICD-10-CM | POA: Diagnosis not present

## 2020-10-06 DIAGNOSIS — Z992 Dependence on renal dialysis: Secondary | ICD-10-CM | POA: Diagnosis not present

## 2020-10-07 DIAGNOSIS — Z4781 Encounter for orthopedic aftercare following surgical amputation: Secondary | ICD-10-CM | POA: Diagnosis not present

## 2020-10-07 DIAGNOSIS — Z89511 Acquired absence of right leg below knee: Secondary | ICD-10-CM | POA: Diagnosis not present

## 2020-10-07 DIAGNOSIS — E1151 Type 2 diabetes mellitus with diabetic peripheral angiopathy without gangrene: Secondary | ICD-10-CM | POA: Diagnosis not present

## 2020-10-07 DIAGNOSIS — I70201 Unspecified atherosclerosis of native arteries of extremities, right leg: Secondary | ICD-10-CM | POA: Diagnosis not present

## 2020-10-07 DIAGNOSIS — E1122 Type 2 diabetes mellitus with diabetic chronic kidney disease: Secondary | ICD-10-CM | POA: Diagnosis not present

## 2020-10-07 DIAGNOSIS — Z4801 Encounter for change or removal of surgical wound dressing: Secondary | ICD-10-CM | POA: Diagnosis not present

## 2020-10-09 DIAGNOSIS — Z992 Dependence on renal dialysis: Secondary | ICD-10-CM | POA: Diagnosis not present

## 2020-10-09 DIAGNOSIS — N186 End stage renal disease: Secondary | ICD-10-CM | POA: Diagnosis not present

## 2020-10-09 DIAGNOSIS — N2581 Secondary hyperparathyroidism of renal origin: Secondary | ICD-10-CM | POA: Diagnosis not present

## 2020-10-09 DIAGNOSIS — D631 Anemia in chronic kidney disease: Secondary | ICD-10-CM | POA: Diagnosis not present

## 2020-10-09 DIAGNOSIS — D509 Iron deficiency anemia, unspecified: Secondary | ICD-10-CM | POA: Diagnosis not present

## 2020-10-10 DIAGNOSIS — E1122 Type 2 diabetes mellitus with diabetic chronic kidney disease: Secondary | ICD-10-CM | POA: Diagnosis not present

## 2020-10-10 DIAGNOSIS — Z89511 Acquired absence of right leg below knee: Secondary | ICD-10-CM | POA: Diagnosis not present

## 2020-10-10 DIAGNOSIS — I70201 Unspecified atherosclerosis of native arteries of extremities, right leg: Secondary | ICD-10-CM | POA: Diagnosis not present

## 2020-10-10 DIAGNOSIS — Z4801 Encounter for change or removal of surgical wound dressing: Secondary | ICD-10-CM | POA: Diagnosis not present

## 2020-10-10 DIAGNOSIS — E1151 Type 2 diabetes mellitus with diabetic peripheral angiopathy without gangrene: Secondary | ICD-10-CM | POA: Diagnosis not present

## 2020-10-10 DIAGNOSIS — Z4781 Encounter for orthopedic aftercare following surgical amputation: Secondary | ICD-10-CM | POA: Diagnosis not present

## 2020-10-11 DIAGNOSIS — E1122 Type 2 diabetes mellitus with diabetic chronic kidney disease: Secondary | ICD-10-CM | POA: Diagnosis not present

## 2020-10-11 DIAGNOSIS — Z992 Dependence on renal dialysis: Secondary | ICD-10-CM | POA: Diagnosis not present

## 2020-10-11 DIAGNOSIS — D509 Iron deficiency anemia, unspecified: Secondary | ICD-10-CM | POA: Diagnosis not present

## 2020-10-11 DIAGNOSIS — Z4801 Encounter for change or removal of surgical wound dressing: Secondary | ICD-10-CM | POA: Diagnosis not present

## 2020-10-11 DIAGNOSIS — D631 Anemia in chronic kidney disease: Secondary | ICD-10-CM | POA: Diagnosis not present

## 2020-10-11 DIAGNOSIS — Z4781 Encounter for orthopedic aftercare following surgical amputation: Secondary | ICD-10-CM | POA: Diagnosis not present

## 2020-10-11 DIAGNOSIS — I70201 Unspecified atherosclerosis of native arteries of extremities, right leg: Secondary | ICD-10-CM | POA: Diagnosis not present

## 2020-10-11 DIAGNOSIS — E1151 Type 2 diabetes mellitus with diabetic peripheral angiopathy without gangrene: Secondary | ICD-10-CM | POA: Diagnosis not present

## 2020-10-11 DIAGNOSIS — N2581 Secondary hyperparathyroidism of renal origin: Secondary | ICD-10-CM | POA: Diagnosis not present

## 2020-10-11 DIAGNOSIS — Z89511 Acquired absence of right leg below knee: Secondary | ICD-10-CM | POA: Diagnosis not present

## 2020-10-11 DIAGNOSIS — N186 End stage renal disease: Secondary | ICD-10-CM | POA: Diagnosis not present

## 2020-10-12 ENCOUNTER — Other Ambulatory Visit: Payer: Self-pay | Admitting: Registered Nurse

## 2020-10-13 DIAGNOSIS — N2581 Secondary hyperparathyroidism of renal origin: Secondary | ICD-10-CM | POA: Diagnosis not present

## 2020-10-13 DIAGNOSIS — D631 Anemia in chronic kidney disease: Secondary | ICD-10-CM | POA: Diagnosis not present

## 2020-10-13 DIAGNOSIS — N186 End stage renal disease: Secondary | ICD-10-CM | POA: Diagnosis not present

## 2020-10-13 DIAGNOSIS — D509 Iron deficiency anemia, unspecified: Secondary | ICD-10-CM | POA: Diagnosis not present

## 2020-10-13 DIAGNOSIS — Z992 Dependence on renal dialysis: Secondary | ICD-10-CM | POA: Diagnosis not present

## 2020-10-16 DIAGNOSIS — D631 Anemia in chronic kidney disease: Secondary | ICD-10-CM | POA: Diagnosis not present

## 2020-10-16 DIAGNOSIS — N186 End stage renal disease: Secondary | ICD-10-CM | POA: Diagnosis not present

## 2020-10-16 DIAGNOSIS — D509 Iron deficiency anemia, unspecified: Secondary | ICD-10-CM | POA: Diagnosis not present

## 2020-10-16 DIAGNOSIS — N2581 Secondary hyperparathyroidism of renal origin: Secondary | ICD-10-CM | POA: Diagnosis not present

## 2020-10-16 DIAGNOSIS — Z992 Dependence on renal dialysis: Secondary | ICD-10-CM | POA: Diagnosis not present

## 2020-10-17 DIAGNOSIS — Z4801 Encounter for change or removal of surgical wound dressing: Secondary | ICD-10-CM | POA: Diagnosis not present

## 2020-10-17 DIAGNOSIS — E1151 Type 2 diabetes mellitus with diabetic peripheral angiopathy without gangrene: Secondary | ICD-10-CM | POA: Diagnosis not present

## 2020-10-17 DIAGNOSIS — Z89511 Acquired absence of right leg below knee: Secondary | ICD-10-CM | POA: Diagnosis not present

## 2020-10-17 DIAGNOSIS — E1122 Type 2 diabetes mellitus with diabetic chronic kidney disease: Secondary | ICD-10-CM | POA: Diagnosis not present

## 2020-10-17 DIAGNOSIS — Z4781 Encounter for orthopedic aftercare following surgical amputation: Secondary | ICD-10-CM | POA: Diagnosis not present

## 2020-10-17 DIAGNOSIS — I70201 Unspecified atherosclerosis of native arteries of extremities, right leg: Secondary | ICD-10-CM | POA: Diagnosis not present

## 2020-10-18 ENCOUNTER — Encounter: Payer: Self-pay | Admitting: Family Medicine

## 2020-10-18 ENCOUNTER — Ambulatory Visit: Payer: Medicare Other | Admitting: Family Medicine

## 2020-10-18 ENCOUNTER — Other Ambulatory Visit: Payer: Self-pay

## 2020-10-18 ENCOUNTER — Ambulatory Visit (INDEPENDENT_AMBULATORY_CARE_PROVIDER_SITE_OTHER): Payer: Medicare Other | Admitting: Family Medicine

## 2020-10-18 VITALS — BP 113/60 | HR 92 | Temp 97.4°F

## 2020-10-18 DIAGNOSIS — I509 Heart failure, unspecified: Secondary | ICD-10-CM | POA: Diagnosis not present

## 2020-10-18 DIAGNOSIS — I70268 Atherosclerosis of native arteries of extremities with gangrene, other extremity: Secondary | ICD-10-CM | POA: Diagnosis not present

## 2020-10-18 DIAGNOSIS — I480 Paroxysmal atrial fibrillation: Secondary | ICD-10-CM | POA: Diagnosis not present

## 2020-10-18 DIAGNOSIS — J9601 Acute respiratory failure with hypoxia: Secondary | ICD-10-CM | POA: Diagnosis not present

## 2020-10-18 DIAGNOSIS — I70201 Unspecified atherosclerosis of native arteries of extremities, right leg: Secondary | ICD-10-CM | POA: Diagnosis not present

## 2020-10-18 DIAGNOSIS — I10 Essential (primary) hypertension: Secondary | ICD-10-CM | POA: Diagnosis not present

## 2020-10-18 DIAGNOSIS — N186 End stage renal disease: Secondary | ICD-10-CM

## 2020-10-18 DIAGNOSIS — E1169 Type 2 diabetes mellitus with other specified complication: Secondary | ICD-10-CM | POA: Diagnosis not present

## 2020-10-18 DIAGNOSIS — J189 Pneumonia, unspecified organism: Secondary | ICD-10-CM | POA: Diagnosis not present

## 2020-10-18 DIAGNOSIS — E871 Hypo-osmolality and hyponatremia: Secondary | ICD-10-CM | POA: Diagnosis not present

## 2020-10-18 DIAGNOSIS — L89150 Pressure ulcer of sacral region, unstageable: Secondary | ICD-10-CM | POA: Diagnosis not present

## 2020-10-18 DIAGNOSIS — L98499 Non-pressure chronic ulcer of skin of other sites with unspecified severity: Secondary | ICD-10-CM | POA: Diagnosis not present

## 2020-10-18 DIAGNOSIS — Z992 Dependence on renal dialysis: Secondary | ICD-10-CM

## 2020-10-18 DIAGNOSIS — E1151 Type 2 diabetes mellitus with diabetic peripheral angiopathy without gangrene: Secondary | ICD-10-CM | POA: Diagnosis not present

## 2020-10-18 DIAGNOSIS — G709 Myoneural disorder, unspecified: Secondary | ICD-10-CM | POA: Diagnosis not present

## 2020-10-18 DIAGNOSIS — L89220 Pressure ulcer of left hip, unstageable: Secondary | ICD-10-CM | POA: Diagnosis not present

## 2020-10-18 DIAGNOSIS — Z4781 Encounter for orthopedic aftercare following surgical amputation: Secondary | ICD-10-CM | POA: Diagnosis not present

## 2020-10-18 DIAGNOSIS — E1122 Type 2 diabetes mellitus with diabetic chronic kidney disease: Secondary | ICD-10-CM

## 2020-10-18 DIAGNOSIS — N2581 Secondary hyperparathyroidism of renal origin: Secondary | ICD-10-CM | POA: Diagnosis not present

## 2020-10-18 DIAGNOSIS — E1165 Type 2 diabetes mellitus with hyperglycemia: Secondary | ICD-10-CM | POA: Diagnosis not present

## 2020-10-18 DIAGNOSIS — D631 Anemia in chronic kidney disease: Secondary | ICD-10-CM | POA: Diagnosis not present

## 2020-10-18 DIAGNOSIS — I110 Hypertensive heart disease with heart failure: Secondary | ICD-10-CM | POA: Diagnosis not present

## 2020-10-18 DIAGNOSIS — E785 Hyperlipidemia, unspecified: Secondary | ICD-10-CM | POA: Diagnosis not present

## 2020-10-18 DIAGNOSIS — Z794 Long term (current) use of insulin: Secondary | ICD-10-CM | POA: Diagnosis not present

## 2020-10-18 DIAGNOSIS — N179 Acute kidney failure, unspecified: Secondary | ICD-10-CM | POA: Diagnosis not present

## 2020-10-18 DIAGNOSIS — J45909 Unspecified asthma, uncomplicated: Secondary | ICD-10-CM | POA: Diagnosis not present

## 2020-10-18 DIAGNOSIS — Z48812 Encounter for surgical aftercare following surgery on the circulatory system: Secondary | ICD-10-CM | POA: Diagnosis not present

## 2020-10-18 DIAGNOSIS — D509 Iron deficiency anemia, unspecified: Secondary | ICD-10-CM | POA: Diagnosis not present

## 2020-10-18 DIAGNOSIS — Z4801 Encounter for change or removal of surgical wound dressing: Secondary | ICD-10-CM | POA: Diagnosis not present

## 2020-10-18 DIAGNOSIS — M1990 Unspecified osteoarthritis, unspecified site: Secondary | ICD-10-CM | POA: Diagnosis not present

## 2020-10-18 NOTE — Progress Notes (Signed)
Subjective:  Patient ID: Christy Zhang, female    DOB: 1955-12-23  Age: 65 y.o. MRN: [id number]  CC: Medical Management of Chronic Issues   HPI Christy Zhang presents for recheck after hospitalization for right below the knee amputation.  She is due for blood work as well.  Patient has been going to dialysis Monday Wednesday Friday.  She was there this morning she says she is doing well with that she feels good.  She is adjusting to the amputation.  Its been about a month now.  She goes back to vascular in 2 days for follow-up.  She is also treated for diabetes.  Blood sugars are doing okay at home.  She is adjusting to preparing meals from the wheelchair as well.  She is trying to follow a diabetic diet but admits not being perfect with it.  She did not bring any blood sugar readings with her. Patient denies symptoms such as polyuria, polydipsia, excessive hunger, nausea No significant hypoglycemic spells noted. Medications reviewed. Pt reports taking them regularly without complication/adverse reaction being reported today.   Follow-up of hypertension. Patient has no history of headache chest pain or shortness of breath or recent cough. Patient also denies symptoms of TIA such as numbness weakness lateralizing. Patient checks  blood pressure at home and has not had any elevated readings recently. Patient denies side effects from his medication. States taking it regularly.   History Christy Zhang has a past medical history of Anemia, Arthritis, Asthma, CHF (congestive heart failure) ([place]), Chronic kidney disease, Diabetes mellitus with complication ([place]), End stage renal disease ([place]), End stage renal disease on dialysis ([place]), Gangrene ([place]), Hemodialysis access, arteriovenous graft ([place]), Hyperglycemia due to diabetes mellitus ([place]) (10/15/2019), Hyperlipidemia, Hypertension, and Neuromuscular disorder ([place]).   She has a past surgical history that includes Insertion of dialysis catheter;  Tubal ligation; AV fistula placement (Right, 08/25/2014); Fistula superficialization (Right, 11/03/2014); Ligation of competing branches of arteriovenous fistula (Right, 11/03/2014); Fistulogram (Right, 01/16/2016); A/V Fistulagram (Right, 02/04/2017); PERIPHERAL VASCULAR BALLOON ANGIOPLASTY (Right, 02/04/2017); IR THROMBECTOMY AV FISTULA/W THROMBOLYSIS/PTA INC SHUNT/IMG RIGHT (Right, 12/03/2018); IR US Guide Vasc Access Right (12/03/2018); IR Fluoro Guide CV Line Right (12/03/2018); IR US Guide Vasc Access Right (12/03/2018); Bascilic vein transposition (Right, 12/24/2018); AV fistula placement (Right, 03/18/2019); IR Removal Tun Cv Cath W/O FL (07/06/2019); IR THROMBECTOMY AV FISTULA W/THROMBOLYSIS/PTA/STENT INC SHUNT/IMG RT (Right, 10/11/2019); IR US Guide Vasc Access Right (10/14/2019); ABDOMINAL AORTOGRAM W/LOWER EXTREMITY (N/A, 11/18/2019); PERIPHERAL VASCULAR BALLOON ANGIOPLASTY (Right, 11/18/2019); ABDOMINAL AORTOGRAM W/LOWER EXTREMITY (N/A, 02/23/2020); PERIPHERAL VASCULAR BALLOON ANGIOPLASTY (02/23/2020); Transmetatarsal amputation (Left, 02/24/2020);  LEFT BELOW KNEE AMPUTATION (Left Knee) (06/08/2020); Amputation (Left, 06/08/2020); ABDOMINAL AORTOGRAM W/LOWER EXTREMITY (N/A, 08/10/2020); PERIPHERAL VASCULAR INTERVENTION (Right, 08/10/2020); Transmetatarsal amputation (Right, 08/11/2020); and Amputation (Right, 09/14/2020).   Her family history includes Cancer in her father; Diabetes in her daughter, father, mother, and son; Heart disease in her daughter and son; Hypertension in her daughter, mother, sister, and son; Peripheral vascular disease in her son.She reports that she quit smoking about 36 years ago. Her smoking use included cigarettes. She has never used smokeless tobacco. She reports that she does not drink alcohol and does not use drugs.  Current Outpatient Medications on File Prior to Visit  Medication Sig Dispense Refill   acetaminophen (TYLENOL) 325 MG tablet Take 1-2 tablets (325-650 mg total) by mouth every 4  (four) hours as needed for mild pain.     amLODipine (NORVASC) 5 MG tablet TAKE ONE TABLET BY MOUTH AT BEDTIME (  Patient taking differently: Take 5 mg by mouth daily.) 30 tablet 3   aspirin EC 81 MG tablet Take 81 mg by mouth at bedtime. Swallow whole.     atorvastatin (LIPITOR) 40 MG tablet TAKE ONE TABLET BY MOUTH DAILY (Patient taking differently: at bedtime.) 90 tablet 0   clopidogrel (PLAVIX) 75 MG tablet Take 1 tablet (75 mg total) by mouth daily. (Patient taking differently: Take 75 mg by mouth every evening.) 30 tablet 11   diphenhydrAMINE (BENADRYL) 25 MG tablet Take 25 mg by mouth every 6 (six) hours as needed for itching.     gabapentin (NEURONTIN) 300 MG capsule Take 1 capsule (300 mg total) by mouth at bedtime. 30 capsule 1   insulin detemir (LEVEMIR) 100 UNIT/ML injection Inject 0.16 mLs (16 Units total) into the skin daily. 5 mL 11   Insulin Syringe-Needle U-100 28G X 1/2" 1 ML MISC Use with insulin daily Dx E11.22 100 each 3   metoprolol succinate (TOPROL-XL) 50 MG 24 hr tablet TAKE ONE TABLET BY MOUTH DAILY. TAKE WITH OR IMMEDIATELY FOLLOWING A MEAL (Patient taking differently: Take 50 mg by mouth at bedtime.) 30 tablet 3   multivitamin (RENA-VIT) TABS tablet Take 1 tablet by mouth at bedtime.      oxyCODONE (OXY IR/ROXICODONE) 5 MG immediate release tablet Take 1 tablet (5 mg total) by mouth every 6 (six) hours as needed for severe pain. 10 tablet 0   polyethylene glycol (MIRALAX / GLYCOLAX) 17 g packet Take 17 g by mouth daily. (Patient taking differently: Take 17 g by mouth daily as needed for mild constipation.) 14 each 0   senna-docusate (SENOKOT-S) 8.6-50 MG tablet Take 1 tablet by mouth 2 (two) times daily. (Patient taking differently: Take 1 tablet by mouth 2 (two) times daily as needed for mild constipation.) 60 tablet 0   sevelamer carbonate (RENVELA) 800 MG tablet Take 1,600-2,400 mg by mouth See admin instructions. Take 2400 mg with each meal and 1600 mg with each snack      TRULICITY 1.5 KC/1.2XN SOPN INJECT CONTENTs OF ONE PEN UNDER THE SKIN WEEKLY ON SATURDAY (Patient taking differently: Inject 1.5 mg into the skin every Saturday.) 2 mL 0   No current facility-administered medications on file prior to visit.    ROS Review of Systems  Constitutional: Negative.   HENT: Negative.    Eyes:  Negative for visual disturbance.  Respiratory:  Negative for shortness of breath.   Cardiovascular:  Negative for chest pain.  Gastrointestinal:  Negative for abdominal pain.  Musculoskeletal:  Negative for arthralgias.   Objective:  BP 113/60   Pulse 92   Temp (!) 97.4 F (36.3 C)   LMP  (LMP Unknown)   BP Readings from Last 3 Encounters:  10/18/20 113/60  09/18/20 (!) 140/41  09/05/20 (!) 157/63    Wt Readings from Last 3 Encounters:  09/18/20 207 lb 3.7 oz (94 kg)  09/05/20 215 lb (97.5 kg)  08/25/20 216 lb (98 kg)     Physical Exam Constitutional:      General: She is not in acute distress.    Appearance: She is well-developed.  Cardiovascular:     Rate and Rhythm: Normal rate and regular rhythm.  Pulmonary:     Breath sounds: Normal breath sounds.  Musculoskeletal:        General: Deformity (Bilateral BKA) present. Normal range of motion.     Comments: Wheelchair-bound today.  Maneuvers it well  Skin:    General: Skin is warm  and dry.  Neurological:     Mental Status: She is alert and oriented to person, place, and time.      Assessment & Plan:   Christy Zhang was seen today for medical management of chronic issues.  Diagnoses and all orders for this visit:  Controlled type 2 diabetes mellitus with chronic kidney disease on chronic dialysis, with long-term current use of insulin ([facility]) -     Cancel: Bayer DCA Hb A1c Waived -     CBC with Differential/Platelet -     CMP14+EGFR  Hyperlipidemia associated with type 2 diabetes mellitus ([place]) -     Lipid panel  ESRD (end stage renal disease) ([place])  Essential (primary)  hypertension     I am having Christy Zhang maintain her sevelamer carbonate, multivitamin, diphenhydrAMINE, clopidogrel, aspirin EC, polyethylene glycol, senna-docusate, acetaminophen, gabapentin, atorvastatin, Trulicity, insulin detemir, Insulin Syringe-Needle U-100, metoprolol succinate, amLODipine, and oxyCODONE.  No orders of the defined types were placed in this encounter.    Follow-up: Return in about 6 weeks (around 11/29/2020).  Claretta Fraise, M.D.

## 2020-10-19 LAB — CMP14+EGFR
ALT: 4 IU/L (ref 0–32)
AST: 8 IU/L (ref 0–40)
Albumin/Globulin Ratio: 1.1 — ABNORMAL LOW (ref 1.2–2.2)
Albumin: 3.5 g/dL — ABNORMAL LOW (ref 3.8–4.8)
Alkaline Phosphatase: 66 IU/L (ref 44–121)
BUN/Creatinine Ratio: 3 — ABNORMAL LOW (ref 12–28)
BUN: 13 mg/dL (ref 8–27)
Bilirubin Total: 0.6 mg/dL (ref 0.0–1.2)
CO2: 25 mmol/L (ref 20–29)
Calcium: 8.8 mg/dL (ref 8.7–10.3)
Chloride: 93 mmol/L — ABNORMAL LOW (ref 96–106)
Creatinine, Ser: 4.77 mg/dL (ref 0.57–1.00)
Globulin, Total: 3.2 g/dL (ref 1.5–4.5)
Glucose: 242 mg/dL — ABNORMAL HIGH (ref 65–99)
Potassium: 3.9 mmol/L (ref 3.5–5.2)
Sodium: 138 mmol/L (ref 134–144)
Total Protein: 6.7 g/dL (ref 6.0–8.5)
eGFR: 10 mL/min/{1.73_m2} — ABNORMAL LOW (ref >59)

## 2020-10-19 LAB — CBC WITH DIFFERENTIAL/PLATELET
Basophils Absolute: 0.1 10*3/uL (ref 0.0–0.2)
Basos: 0 %
EOS (ABSOLUTE): 0.2 10*3/uL (ref 0.0–0.4)
Eos: 2 %
Hematocrit: 36.6 % (ref 34.0–46.6)
Hemoglobin: 11 g/dL — ABNORMAL LOW (ref 11.1–15.9)
Immature Grans (Abs): 0.1 10*3/uL (ref 0.0–0.1)
Immature Granulocytes: 1 %
Lymphocytes Absolute: 1.9 10*3/uL (ref 0.7–3.1)
Lymphs: 12 %
MCH: 24.5 pg — ABNORMAL LOW (ref 26.6–33.0)
MCHC: 30.1 g/dL — ABNORMAL LOW (ref 31.5–35.7)
MCV: 82 fL (ref 79–97)
Monocytes Absolute: 1.4 10*3/uL — ABNORMAL HIGH (ref 0.1–0.9)
Monocytes: 9 %
Neutrophils Absolute: 11.6 10*3/uL — ABNORMAL HIGH (ref 1.4–7.0)
Neutrophils: 76 %
Platelets: 495 10*3/uL — ABNORMAL HIGH (ref 150–450)
RBC: 4.49 x10E6/uL (ref 3.77–5.28)
RDW: 16.4 % — ABNORMAL HIGH (ref 11.7–15.4)
WBC: 15.3 10*3/uL — ABNORMAL HIGH (ref 3.4–10.8)

## 2020-10-19 LAB — LIPID PANEL
Chol/HDL Ratio: 2.1 ratio (ref 0.0–4.4)
Cholesterol, Total: 104 mg/dL (ref 100–199)
HDL: 49 mg/dL (ref >39)
LDL Chol Calc (NIH): 38 mg/dL (ref 0–99)
Triglycerides: 84 mg/dL (ref 0–149)
VLDL Cholesterol Cal: 17 mg/dL (ref 5–40)

## 2020-10-20 ENCOUNTER — Other Ambulatory Visit: Payer: Self-pay

## 2020-10-20 ENCOUNTER — Ambulatory Visit (INDEPENDENT_AMBULATORY_CARE_PROVIDER_SITE_OTHER): Payer: Medicare Other | Admitting: Physician Assistant

## 2020-10-20 VITALS — BP 135/70 | HR 88 | Temp 97.1°F | Resp 20 | Ht 63.0 in

## 2020-10-20 DIAGNOSIS — I739 Peripheral vascular disease, unspecified: Secondary | ICD-10-CM

## 2020-10-20 DIAGNOSIS — Z89511 Acquired absence of right leg below knee: Secondary | ICD-10-CM

## 2020-10-20 DIAGNOSIS — N186 End stage renal disease: Secondary | ICD-10-CM | POA: Diagnosis not present

## 2020-10-20 DIAGNOSIS — Z992 Dependence on renal dialysis: Secondary | ICD-10-CM | POA: Diagnosis not present

## 2020-10-20 DIAGNOSIS — N2581 Secondary hyperparathyroidism of renal origin: Secondary | ICD-10-CM | POA: Diagnosis not present

## 2020-10-20 DIAGNOSIS — D509 Iron deficiency anemia, unspecified: Secondary | ICD-10-CM | POA: Diagnosis not present

## 2020-10-20 DIAGNOSIS — D631 Anemia in chronic kidney disease: Secondary | ICD-10-CM | POA: Diagnosis not present

## 2020-10-20 DIAGNOSIS — Z89512 Acquired absence of left leg below knee: Secondary | ICD-10-CM

## 2020-10-20 NOTE — Progress Notes (Signed)
POST OPERATIVE OFFICE NOTE    CC:  F/u for surgery  HPI:  This is a 65 y.o. female who is s/p right below knee amputation on 09/14/20 by Dr. Donzetta Matters.She previously had undergone revascularization followed by right TMA. Unfortunately this failed to heal and she was indicated for a right below knee amputation. She says she is not having any pain. She has had only a few occasions with phantom pains. She says she has been having [facility] RN come do her wound care 1x per week now. They were previously coming 2x per week. Her daughter otherwise cleans and changes dressings daily. She has been keeping her stump dressed all the time. She is still receiving OT but completed her PT. Her left BKA is doing well and she reports it is well healed  Allergies  Allergen Reactions   Sulfa Antibiotics Swelling    Current Outpatient Medications  Medication Sig Dispense Refill   acetaminophen (TYLENOL) 325 MG tablet Take 1-2 tablets (325-650 mg total) by mouth every 4 (four) hours as needed for mild pain.     amLODipine (NORVASC) 5 MG tablet TAKE ONE TABLET BY MOUTH AT BEDTIME (Patient taking differently: Take 5 mg by mouth daily.) 30 tablet 3   aspirin EC 81 MG tablet Take 81 mg by mouth at bedtime. Swallow whole.     atorvastatin (LIPITOR) 40 MG tablet TAKE ONE TABLET BY MOUTH DAILY (Patient taking differently: at bedtime.) 90 tablet 0   clopidogrel (PLAVIX) 75 MG tablet Take 1 tablet (75 mg total) by mouth daily. (Patient taking differently: Take 75 mg by mouth every evening.) 30 tablet 11   diphenhydrAMINE (BENADRYL) 25 MG tablet Take 25 mg by mouth every 6 (six) hours as needed for itching.     gabapentin (NEURONTIN) 300 MG capsule Take 1 capsule (300 mg total) by mouth at bedtime. 30 capsule 1   insulin detemir (LEVEMIR) 100 UNIT/ML injection Inject 0.16 mLs (16 Units total) into the skin daily. 5 mL 11   Insulin Syringe-Needle U-100 28G X 1/2" 1 ML MISC Use with insulin daily Dx E11.22 100 each 3   metoprolol  succinate (TOPROL-XL) 50 MG 24 hr tablet TAKE ONE TABLET BY MOUTH DAILY. TAKE WITH OR IMMEDIATELY FOLLOWING A MEAL (Patient taking differently: Take 50 mg by mouth at bedtime.) 30 tablet 3   multivitamin (RENA-VIT) TABS tablet Take 1 tablet by mouth at bedtime.      oxyCODONE (OXY IR/ROXICODONE) 5 MG immediate release tablet Take 1 tablet (5 mg total) by mouth every 6 (six) hours as needed for severe pain. 10 tablet 0   polyethylene glycol (MIRALAX / GLYCOLAX) 17 g packet Take 17 g by mouth daily. (Patient taking differently: Take 17 g by mouth daily as needed for mild constipation.) 14 each 0   senna-docusate (SENOKOT-S) 8.6-50 MG tablet Take 1 tablet by mouth 2 (two) times daily. (Patient taking differently: Take 1 tablet by mouth 2 (two) times daily as needed for mild constipation.) 60 tablet 0   sevelamer carbonate (RENVELA) 800 MG tablet Take 1,600-2,400 mg by mouth See admin instructions. Take 2400 mg with each meal and 1600 mg with each snack     TRULICITY 1.5 0000000 SOPN INJECT CONTENTs OF ONE PEN UNDER THE SKIN WEEKLY ON SATURDAY (Patient taking differently: Inject 1.5 mg into the skin every Saturday.) 2 mL 0   No current facility-administered medications for this visit.     ROS:  See HPI  Physical Exam:  Vitals:   10/20/20  1532  BP: 135/70  Pulse: 88  Resp: 20  Temp: (!) 97.1 F (36.2 C)  TempSrc: Temporal  SpO2: 92%  Height: '5\' 3"'$  (1.6 m)   General: well appearing, not in acute distress Incision:  right BKA as depicted below. The posterior flap with eschars present  Extremities:  left BKA well healed Neuro: alert and oriented   Assessment/Plan:  This is a 65 y.o. female who is s/p right below knee amputation on 09/14/20 by Dr. Donzetta Matters. She is not having any pain. She has eschars on the posterior flap that are concerning for her stump to not heal. She does not report any drainage. There is no signs of infection presently. I did not remove the staples today as I think it  would likely result in dehiscence of the incision.  - I discussed with her likelihood that she will need revision or conversion of her right BKA to an above knee amputation. I think its reasonable to give it a little more time to possible show signs of healing.  - I have recommended that she allow it to get some air throughout the day and not have it covered all the time - continue to clean with wound cleaner or mild soap and water and pat dry - I will have her return in 1 week for wound check/ staple removal    Karoline Caldwell, PA-C [facility] [phone]  On call MD:  Donzetta Matters

## 2020-10-23 DIAGNOSIS — E119 Type 2 diabetes mellitus without complications: Secondary | ICD-10-CM | POA: Diagnosis not present

## 2020-10-23 DIAGNOSIS — N186 End stage renal disease: Secondary | ICD-10-CM | POA: Diagnosis not present

## 2020-10-23 DIAGNOSIS — D631 Anemia in chronic kidney disease: Secondary | ICD-10-CM | POA: Diagnosis not present

## 2020-10-23 DIAGNOSIS — D509 Iron deficiency anemia, unspecified: Secondary | ICD-10-CM | POA: Diagnosis not present

## 2020-10-23 DIAGNOSIS — Z794 Long term (current) use of insulin: Secondary | ICD-10-CM | POA: Diagnosis not present

## 2020-10-23 DIAGNOSIS — N2581 Secondary hyperparathyroidism of renal origin: Secondary | ICD-10-CM | POA: Diagnosis not present

## 2020-10-23 DIAGNOSIS — Z992 Dependence on renal dialysis: Secondary | ICD-10-CM | POA: Diagnosis not present

## 2020-10-24 ENCOUNTER — Telehealth: Payer: Medicare Other | Admitting: *Deleted

## 2020-10-25 DIAGNOSIS — N186 End stage renal disease: Secondary | ICD-10-CM | POA: Diagnosis not present

## 2020-10-25 DIAGNOSIS — Z4801 Encounter for change or removal of surgical wound dressing: Secondary | ICD-10-CM | POA: Diagnosis not present

## 2020-10-25 DIAGNOSIS — Z4781 Encounter for orthopedic aftercare following surgical amputation: Secondary | ICD-10-CM | POA: Diagnosis not present

## 2020-10-25 DIAGNOSIS — D631 Anemia in chronic kidney disease: Secondary | ICD-10-CM | POA: Diagnosis not present

## 2020-10-25 DIAGNOSIS — Z992 Dependence on renal dialysis: Secondary | ICD-10-CM | POA: Diagnosis not present

## 2020-10-25 DIAGNOSIS — N2581 Secondary hyperparathyroidism of renal origin: Secondary | ICD-10-CM | POA: Diagnosis not present

## 2020-10-25 DIAGNOSIS — I480 Paroxysmal atrial fibrillation: Secondary | ICD-10-CM | POA: Diagnosis not present

## 2020-10-25 DIAGNOSIS — J9601 Acute respiratory failure with hypoxia: Secondary | ICD-10-CM | POA: Diagnosis not present

## 2020-10-25 DIAGNOSIS — Z48812 Encounter for surgical aftercare following surgery on the circulatory system: Secondary | ICD-10-CM | POA: Diagnosis not present

## 2020-10-25 DIAGNOSIS — J189 Pneumonia, unspecified organism: Secondary | ICD-10-CM | POA: Diagnosis not present

## 2020-10-25 DIAGNOSIS — D509 Iron deficiency anemia, unspecified: Secondary | ICD-10-CM | POA: Diagnosis not present

## 2020-10-26 ENCOUNTER — Telehealth: Payer: Self-pay | Admitting: Family Medicine

## 2020-10-26 DIAGNOSIS — Z992 Dependence on renal dialysis: Secondary | ICD-10-CM | POA: Diagnosis not present

## 2020-10-26 DIAGNOSIS — N186 End stage renal disease: Secondary | ICD-10-CM | POA: Diagnosis not present

## 2020-10-26 NOTE — Telephone Encounter (Signed)
Labs done last week showed her blood sugar at 242.  Kidney function was stable with a creatinine of approximately 4.8.  This is typical for her dialysis.  Her cholesterol was excellent.  Her white blood count was a bit high but that is typical for her and for her recent amputation.

## 2020-10-27 ENCOUNTER — Emergency Department (HOSPITAL_COMMUNITY): Payer: Medicare Other

## 2020-10-27 ENCOUNTER — Ambulatory Visit (INDEPENDENT_AMBULATORY_CARE_PROVIDER_SITE_OTHER): Payer: Medicare Other | Admitting: Physician Assistant

## 2020-10-27 ENCOUNTER — Other Ambulatory Visit: Payer: Self-pay

## 2020-10-27 ENCOUNTER — Inpatient Hospital Stay (HOSPITAL_COMMUNITY)
Admission: EM | Admit: 2020-10-27 | Discharge: 2020-11-14 | DRG: 853 | Disposition: A | Discharge status: Home Health | Payer: Medicare Other | Attending: Internal Medicine | Admitting: Internal Medicine

## 2020-10-27 ENCOUNTER — Encounter: Payer: Self-pay | Admitting: Physician Assistant

## 2020-10-27 VITALS — Resp 20

## 2020-10-27 DIAGNOSIS — I151 Hypertension secondary to other renal disorders: Secondary | ICD-10-CM | POA: Diagnosis not present

## 2020-10-27 DIAGNOSIS — R6521 Severe sepsis with septic shock: Secondary | ICD-10-CM | POA: Diagnosis present

## 2020-10-27 DIAGNOSIS — J9602 Acute respiratory failure with hypercapnia: Secondary | ICD-10-CM | POA: Diagnosis not present

## 2020-10-27 DIAGNOSIS — D72829 Elevated white blood cell count, unspecified: Secondary | ICD-10-CM

## 2020-10-27 DIAGNOSIS — Z882 Allergy status to sulfonamides status: Secondary | ICD-10-CM

## 2020-10-27 DIAGNOSIS — J90 Pleural effusion, not elsewhere classified: Secondary | ICD-10-CM | POA: Diagnosis not present

## 2020-10-27 DIAGNOSIS — I5033 Acute on chronic diastolic (congestive) heart failure: Secondary | ICD-10-CM | POA: Diagnosis not present

## 2020-10-27 DIAGNOSIS — M79642 Pain in left hand: Secondary | ICD-10-CM | POA: Diagnosis not present

## 2020-10-27 DIAGNOSIS — N2581 Secondary hyperparathyroidism of renal origin: Secondary | ICD-10-CM | POA: Diagnosis not present

## 2020-10-27 DIAGNOSIS — E1159 Type 2 diabetes mellitus with other circulatory complications: Secondary | ICD-10-CM | POA: Diagnosis not present

## 2020-10-27 DIAGNOSIS — E876 Hypokalemia: Secondary | ICD-10-CM | POA: Diagnosis not present

## 2020-10-27 DIAGNOSIS — I10 Essential (primary) hypertension: Secondary | ICD-10-CM

## 2020-10-27 DIAGNOSIS — A419 Sepsis, unspecified organism: Principal | ICD-10-CM | POA: Diagnosis present

## 2020-10-27 DIAGNOSIS — I132 Hypertensive heart and chronic kidney disease with heart failure and with stage 5 chronic kidney disease, or end stage renal disease: Secondary | ICD-10-CM | POA: Diagnosis present

## 2020-10-27 DIAGNOSIS — R579 Shock, unspecified: Secondary | ICD-10-CM | POA: Diagnosis not present

## 2020-10-27 DIAGNOSIS — E1122 Type 2 diabetes mellitus with diabetic chronic kidney disease: Secondary | ICD-10-CM | POA: Diagnosis not present

## 2020-10-27 DIAGNOSIS — J189 Pneumonia, unspecified organism: Secondary | ICD-10-CM | POA: Diagnosis not present

## 2020-10-27 DIAGNOSIS — E871 Hypo-osmolality and hyponatremia: Secondary | ICD-10-CM | POA: Diagnosis present

## 2020-10-27 DIAGNOSIS — I480 Paroxysmal atrial fibrillation: Secondary | ICD-10-CM

## 2020-10-27 DIAGNOSIS — R0902 Hypoxemia: Secondary | ICD-10-CM

## 2020-10-27 DIAGNOSIS — I152 Hypertension secondary to endocrine disorders: Secondary | ICD-10-CM | POA: Diagnosis not present

## 2020-10-27 DIAGNOSIS — I462 Cardiac arrest due to underlying cardiac condition: Secondary | ICD-10-CM | POA: Diagnosis not present

## 2020-10-27 DIAGNOSIS — N179 Acute kidney failure, unspecified: Secondary | ICD-10-CM | POA: Diagnosis not present

## 2020-10-27 DIAGNOSIS — Z89511 Acquired absence of right leg below knee: Secondary | ICD-10-CM

## 2020-10-27 DIAGNOSIS — Z7189 Other specified counseling: Secondary | ICD-10-CM | POA: Diagnosis not present

## 2020-10-27 DIAGNOSIS — I742 Embolism and thrombosis of arteries of the upper extremities: Secondary | ICD-10-CM | POA: Diagnosis not present

## 2020-10-27 DIAGNOSIS — Z7902 Long term (current) use of antithrombotics/antiplatelets: Secondary | ICD-10-CM

## 2020-10-27 DIAGNOSIS — Z7982 Long term (current) use of aspirin: Secondary | ICD-10-CM

## 2020-10-27 DIAGNOSIS — I120 Hypertensive chronic kidney disease with stage 5 chronic kidney disease or end stage renal disease: Secondary | ICD-10-CM | POA: Diagnosis not present

## 2020-10-27 DIAGNOSIS — Z452 Encounter for adjustment and management of vascular access device: Secondary | ICD-10-CM | POA: Diagnosis not present

## 2020-10-27 DIAGNOSIS — E669 Obesity, unspecified: Secondary | ICD-10-CM | POA: Diagnosis present

## 2020-10-27 DIAGNOSIS — I469 Cardiac arrest, cause unspecified: Secondary | ICD-10-CM | POA: Diagnosis not present

## 2020-10-27 DIAGNOSIS — T8189XD Other complications of procedures, not elsewhere classified, subsequent encounter: Secondary | ICD-10-CM

## 2020-10-27 DIAGNOSIS — Z820 Family history of epilepsy and other diseases of the nervous system: Secondary | ICD-10-CM

## 2020-10-27 DIAGNOSIS — Z992 Dependence on renal dialysis: Secondary | ICD-10-CM | POA: Diagnosis not present

## 2020-10-27 DIAGNOSIS — Z833 Family history of diabetes mellitus: Secondary | ICD-10-CM

## 2020-10-27 DIAGNOSIS — N186 End stage renal disease: Secondary | ICD-10-CM | POA: Diagnosis not present

## 2020-10-27 DIAGNOSIS — I509 Heart failure, unspecified: Secondary | ICD-10-CM | POA: Diagnosis not present

## 2020-10-27 DIAGNOSIS — E8809 Other disorders of plasma-protein metabolism, not elsewhere classified: Secondary | ICD-10-CM | POA: Diagnosis not present

## 2020-10-27 DIAGNOSIS — E785 Hyperlipidemia, unspecified: Secondary | ICD-10-CM | POA: Diagnosis present

## 2020-10-27 DIAGNOSIS — D638 Anemia in other chronic diseases classified elsewhere: Secondary | ICD-10-CM | POA: Diagnosis present

## 2020-10-27 DIAGNOSIS — E1165 Type 2 diabetes mellitus with hyperglycemia: Secondary | ICD-10-CM | POA: Diagnosis not present

## 2020-10-27 DIAGNOSIS — I214 Non-ST elevation (NSTEMI) myocardial infarction: Secondary | ICD-10-CM | POA: Diagnosis not present

## 2020-10-27 DIAGNOSIS — R4182 Altered mental status, unspecified: Secondary | ICD-10-CM | POA: Diagnosis present

## 2020-10-27 DIAGNOSIS — Z79899 Other long term (current) drug therapy: Secondary | ICD-10-CM

## 2020-10-27 DIAGNOSIS — Z8249 Family history of ischemic heart disease and other diseases of the circulatory system: Secondary | ICD-10-CM

## 2020-10-27 DIAGNOSIS — Z794 Long term (current) use of insulin: Secondary | ICD-10-CM

## 2020-10-27 DIAGNOSIS — I4819 Other persistent atrial fibrillation: Secondary | ICD-10-CM | POA: Diagnosis not present

## 2020-10-27 DIAGNOSIS — Z6834 Body mass index (BMI) 34.0-34.9, adult: Secondary | ICD-10-CM

## 2020-10-27 DIAGNOSIS — E782 Mixed hyperlipidemia: Secondary | ICD-10-CM | POA: Diagnosis not present

## 2020-10-27 DIAGNOSIS — T82590A Other mechanical complication of surgically created arteriovenous fistula, initial encounter: Secondary | ICD-10-CM | POA: Diagnosis not present

## 2020-10-27 DIAGNOSIS — N2889 Other specified disorders of kidney and ureter: Secondary | ICD-10-CM | POA: Diagnosis not present

## 2020-10-27 DIAGNOSIS — Z89512 Acquired absence of left leg below knee: Secondary | ICD-10-CM

## 2020-10-27 DIAGNOSIS — D631 Anemia in chronic kidney disease: Secondary | ICD-10-CM | POA: Diagnosis not present

## 2020-10-27 DIAGNOSIS — K5900 Constipation, unspecified: Secondary | ICD-10-CM | POA: Diagnosis present

## 2020-10-27 DIAGNOSIS — R209 Unspecified disturbances of skin sensation: Secondary | ICD-10-CM | POA: Diagnosis not present

## 2020-10-27 DIAGNOSIS — T82868A Thrombosis of vascular prosthetic devices, implants and grafts, initial encounter: Secondary | ICD-10-CM | POA: Diagnosis not present

## 2020-10-27 DIAGNOSIS — D509 Iron deficiency anemia, unspecified: Secondary | ICD-10-CM | POA: Diagnosis not present

## 2020-10-27 DIAGNOSIS — Z781 Physical restraint status: Secondary | ICD-10-CM

## 2020-10-27 DIAGNOSIS — Z20822 Contact with and (suspected) exposure to covid-19: Secondary | ICD-10-CM | POA: Diagnosis not present

## 2020-10-27 DIAGNOSIS — K828 Other specified diseases of gallbladder: Secondary | ICD-10-CM | POA: Diagnosis not present

## 2020-10-27 DIAGNOSIS — E872 Acidosis, unspecified: Secondary | ICD-10-CM | POA: Diagnosis present

## 2020-10-27 DIAGNOSIS — Z87891 Personal history of nicotine dependence: Secondary | ICD-10-CM

## 2020-10-27 DIAGNOSIS — D75839 Thrombocytosis, unspecified: Secondary | ICD-10-CM | POA: Diagnosis present

## 2020-10-27 DIAGNOSIS — Z789 Other specified health status: Secondary | ICD-10-CM

## 2020-10-27 DIAGNOSIS — I21A9 Other myocardial infarction type: Secondary | ICD-10-CM | POA: Diagnosis not present

## 2020-10-27 DIAGNOSIS — Z9689 Presence of other specified functional implants: Secondary | ICD-10-CM

## 2020-10-27 DIAGNOSIS — E1152 Type 2 diabetes mellitus with diabetic peripheral angiopathy with gangrene: Secondary | ICD-10-CM | POA: Diagnosis not present

## 2020-10-27 DIAGNOSIS — E7800 Pure hypercholesterolemia, unspecified: Secondary | ICD-10-CM | POA: Diagnosis not present

## 2020-10-27 DIAGNOSIS — I248 Other forms of acute ischemic heart disease: Secondary | ICD-10-CM | POA: Diagnosis present

## 2020-10-27 DIAGNOSIS — G9341 Metabolic encephalopathy: Secondary | ICD-10-CM | POA: Diagnosis not present

## 2020-10-27 DIAGNOSIS — R000 Tachycardia, unspecified: Secondary | ICD-10-CM | POA: Diagnosis not present

## 2020-10-27 DIAGNOSIS — I4892 Unspecified atrial flutter: Secondary | ICD-10-CM | POA: Diagnosis not present

## 2020-10-27 DIAGNOSIS — R739 Hyperglycemia, unspecified: Secondary | ICD-10-CM | POA: Diagnosis not present

## 2020-10-27 DIAGNOSIS — Z4659 Encounter for fitting and adjustment of other gastrointestinal appliance and device: Secondary | ICD-10-CM

## 2020-10-27 DIAGNOSIS — N250 Renal osteodystrophy: Secondary | ICD-10-CM | POA: Diagnosis not present

## 2020-10-27 DIAGNOSIS — R0609 Other forms of dyspnea: Secondary | ICD-10-CM | POA: Diagnosis not present

## 2020-10-27 DIAGNOSIS — I471 Supraventricular tachycardia: Secondary | ICD-10-CM | POA: Diagnosis present

## 2020-10-27 DIAGNOSIS — I517 Cardiomegaly: Secondary | ICD-10-CM | POA: Diagnosis not present

## 2020-10-27 DIAGNOSIS — J9601 Acute respiratory failure with hypoxia: Secondary | ICD-10-CM | POA: Diagnosis not present

## 2020-10-27 DIAGNOSIS — I739 Peripheral vascular disease, unspecified: Secondary | ICD-10-CM | POA: Diagnosis not present

## 2020-10-27 DIAGNOSIS — N185 Chronic kidney disease, stage 5: Secondary | ICD-10-CM | POA: Diagnosis not present

## 2020-10-27 DIAGNOSIS — J9811 Atelectasis: Secondary | ICD-10-CM | POA: Diagnosis not present

## 2020-10-27 DIAGNOSIS — Z515 Encounter for palliative care: Secondary | ICD-10-CM | POA: Diagnosis not present

## 2020-10-27 DIAGNOSIS — D72823 Leukemoid reaction: Secondary | ICD-10-CM | POA: Diagnosis present

## 2020-10-27 DIAGNOSIS — E11649 Type 2 diabetes mellitus with hypoglycemia without coma: Secondary | ICD-10-CM | POA: Diagnosis not present

## 2020-10-27 DIAGNOSIS — R404 Transient alteration of awareness: Secondary | ICD-10-CM | POA: Diagnosis not present

## 2020-10-27 DIAGNOSIS — Z4682 Encounter for fitting and adjustment of non-vascular catheter: Secondary | ICD-10-CM | POA: Diagnosis not present

## 2020-10-27 DIAGNOSIS — J4530 Mild persistent asthma, uncomplicated: Secondary | ICD-10-CM | POA: Diagnosis not present

## 2020-10-27 LAB — CBC WITH DIFFERENTIAL/PLATELET
Abs Immature Granulocytes: 0.37 10*3/uL — ABNORMAL HIGH (ref 0.00–0.07)
Basophils Absolute: 0.1 10*3/uL (ref 0.0–0.1)
Basophils Relative: 0 %
Eosinophils Absolute: 0.1 10*3/uL (ref 0.0–0.5)
Eosinophils Relative: 0 %
HCT: 32 % — ABNORMAL LOW (ref 36.0–46.0)
Hemoglobin: 9.5 g/dL — ABNORMAL LOW (ref 12.0–15.0)
Immature Granulocytes: 2 %
Lymphocytes Relative: 9 %
Lymphs Abs: 1.6 10*3/uL (ref 0.7–4.0)
MCH: 24.3 pg — ABNORMAL LOW (ref 26.0–34.0)
MCHC: 29.7 g/dL — ABNORMAL LOW (ref 30.0–36.0)
MCV: 81.8 fL (ref 80.0–100.0)
Monocytes Absolute: 0.8 10*3/uL (ref 0.1–1.0)
Monocytes Relative: 4 %
Neutro Abs: 15.7 10*3/uL — ABNORMAL HIGH (ref 1.7–7.7)
Neutrophils Relative %: 85 %
Platelets: 736 10*3/uL — ABNORMAL HIGH (ref 150–400)
RBC: 3.91 MIL/uL (ref 3.87–5.11)
RDW: 18.9 % — ABNORMAL HIGH (ref 11.5–15.5)
WBC: 18.7 10*3/uL — ABNORMAL HIGH (ref 4.0–10.5)
nRBC: 1.2 % — ABNORMAL HIGH (ref 0.0–0.2)

## 2020-10-27 LAB — CBG MONITORING, ED: Glucose-Capillary: 241 mg/dL — ABNORMAL HIGH (ref 70–99)

## 2020-10-27 LAB — APTT: aPTT: 31 seconds (ref 24–36)

## 2020-10-27 LAB — PROTIME-INR
INR: 1.5 — ABNORMAL HIGH (ref 0.8–1.2)
Prothrombin Time: 18.3 seconds — ABNORMAL HIGH (ref 11.4–15.2)

## 2020-10-27 LAB — COMPREHENSIVE METABOLIC PANEL
ALT: 18 U/L (ref 0–44)
AST: 30 U/L (ref 15–41)
Albumin: 2.8 g/dL — ABNORMAL LOW (ref 3.5–5.0)
Alkaline Phosphatase: 86 U/L (ref 38–126)
Anion gap: 15 (ref 5–15)
BUN: 25 mg/dL — ABNORMAL HIGH (ref 8–23)
CO2: 25 mmol/L (ref 22–32)
Calcium: 8.3 mg/dL — ABNORMAL LOW (ref 8.9–10.3)
Chloride: 90 mmol/L — ABNORMAL LOW (ref 98–111)
Creatinine, Ser: 6.44 mg/dL — ABNORMAL HIGH (ref 0.44–1.00)
GFR, Estimated: 7 mL/min — ABNORMAL LOW (ref >60)
Glucose, Bld: 230 mg/dL — ABNORMAL HIGH (ref 70–99)
Potassium: 4 mmol/L (ref 3.5–5.1)
Sodium: 130 mmol/L — ABNORMAL LOW (ref 135–145)
Total Bilirubin: 0.5 mg/dL (ref 0.3–1.2)
Total Protein: 7.6 g/dL (ref 6.5–8.1)

## 2020-10-27 LAB — TROPONIN I (HIGH SENSITIVITY): Troponin I (High Sensitivity): 79 ng/L — ABNORMAL HIGH (ref <18)

## 2020-10-27 LAB — LACTIC ACID, PLASMA
Lactic Acid, Venous: 2.9 mmol/L (ref 0.5–1.9)
Lactic Acid, Venous: 2 mmol/L (ref 0.5–1.9)

## 2020-10-27 MED ORDER — SODIUM CHLORIDE 0.9 % IV BOLUS
500.0000 mL | Freq: Once | INTRAVENOUS | Status: AC
  Administered 2020-10-27: 500 mL via INTRAVENOUS

## 2020-10-27 MED ORDER — INSULIN ASPART 100 UNIT/ML IJ SOLN
0.0000 [IU] | Freq: Three times a day (TID) | INTRAMUSCULAR | Status: DC
  Administered 2020-10-28 (×2): 1 [IU] via SUBCUTANEOUS
  Administered 2020-10-28 – 2020-10-29 (×3): 2 [IU] via SUBCUTANEOUS
  Filled 2020-10-27: qty 1

## 2020-10-27 MED ORDER — SODIUM CHLORIDE 0.9 % IV SOLN
1.0000 g | INTRAVENOUS | Status: DC
  Administered 2020-10-28: 1 g via INTRAVENOUS
  Filled 2020-10-27 (×3): qty 1

## 2020-10-27 MED ORDER — SODIUM CHLORIDE 0.9 % IV SOLN
2.0000 g | Freq: Once | INTRAVENOUS | Status: AC
  Administered 2020-10-27: 2 g via INTRAVENOUS
  Filled 2020-10-27: qty 2

## 2020-10-27 MED ORDER — VANCOMYCIN HCL IN DEXTROSE 1-5 GM/200ML-% IV SOLN
1000.0000 mg | Freq: Once | INTRAVENOUS | Status: AC
  Administered 2020-10-27: 1000 mg via INTRAVENOUS
  Filled 2020-10-27: qty 200

## 2020-10-27 MED ORDER — HEPARIN SODIUM (PORCINE) 5000 UNIT/ML IJ SOLN
5000.0000 [IU] | Freq: Three times a day (TID) | INTRAMUSCULAR | Status: DC
  Administered 2020-10-27 – 2020-10-28 (×4): 5000 [IU] via SUBCUTANEOUS
  Filled 2020-10-27 (×4): qty 1

## 2020-10-27 MED ORDER — ACETAMINOPHEN 325 MG PO TABS: 650.0000 mg | ORAL_TABLET | Freq: Four times a day (QID) | ORAL | Status: DC | PRN

## 2020-10-27 MED ORDER — METRONIDAZOLE 500 MG/100ML IV SOLN
500.0000 mg | Freq: Once | INTRAVENOUS | Status: AC
  Administered 2020-10-27: 500 mg via INTRAVENOUS
  Filled 2020-10-27: qty 100

## 2020-10-27 MED ORDER — GLUCERNA SHAKE PO LIQD
237.0000 mL | Freq: Three times a day (TID) | ORAL | Status: DC
  Administered 2020-10-28: 237 mL via ORAL
  Filled 2020-10-27 (×8): qty 237

## 2020-10-27 MED ORDER — INSULIN DETEMIR 100 UNIT/ML ~~LOC~~ SOLN
8.0000 [IU] | Freq: Every day | SUBCUTANEOUS | Status: DC
  Administered 2020-10-28 – 2020-10-30 (×4): 8 [IU] via SUBCUTANEOUS
  Filled 2020-10-27 (×7): qty 0.08

## 2020-10-27 NOTE — ED Notes (Signed)
Critical Value 2.9 lactic acid report to [facility] at this time. No new orders

## 2020-10-27 NOTE — Telephone Encounter (Signed)
[email or web address]

## 2020-10-27 NOTE — Telephone Encounter (Signed)
Pt returned missed call. Reviewed lab results with pt per Dr Livia Snellen notes. Pt voiced understanding.

## 2020-10-27 NOTE — ED Notes (Signed)
Pt on purewick checked canister (Empty)

## 2020-10-27 NOTE — ED Triage Notes (Signed)
Per EMS pt went to dialysis today and came home but no one knows if she got a full tx, partial tx, or no tx.  Pt's family had her brought to the ER because she was more confused than normal.

## 2020-10-27 NOTE — Sepsis Progress Note (Signed)
Following per sepsis protocol   

## 2020-10-27 NOTE — H&P (Addendum)
History and Physical  LAKE BREEDING NJX:[id number] DOB: 1955-07-28 DOA: 10/27/2020  Referring physician: Bethann Berkshire, MD PCP: Mechele Claude, MD  Patient coming from: Home  Chief Complaint: Altered mental status  HPI: Christy Zhang is a 65 y.o. female with medical history significant for bilateral BKA, ESRD on HD (MWF), T2DM, AOCD, hypertension, thrombocytosis, peripheral vascular disease and hyperlipidemia who presents to the emergency department via EMS due to altered mental status.  Patient complained of 2-week onset of intermittent shortness of breath and dry cough, she complained of fatigue and weakness while having dialysis this morning, on returning home, she complained of continued weakness and then states that she did not remember what happened afterwards.  Rest of the history was obtained from the ED physician, per report, family noted patient being more confused and lethargic after arrival from the HD, EMS was activated and patient was taken to the ED for further evaluation and management.  ED Course:  In the emergency department, BP was soft at 92/68, other vital signs were within normal range.  Patient was reported to be hypoxic on supplemental oxygen via [place] at 2 LPM was provided per ED physician.  Work-up in the ED showed leukocytosis, thrombocytosis, hyponatremia, hyperglycemia, BUN/creatinine 24/6.44 with eGFR of 7, albumin 2.8, lactic acid 2.9. Chest x-ray was suggestive of acute consolidation or pneumonia with volume loss in the lingula Patient was empirically started on IV cefepime and vancomycin for presumed pneumonia, metronidazole was given.  IV NS 500 mL was given.  Hospitalist was asked to admit patient for further evaluation and management.  Review of Systems: Constitutional: Negative for chills and fever.  HENT: Negative for ear pain and sore throat.   Eyes: Negative for pain and visual disturbance.  Respiratory: Positive for cough and shortness of breath.    Cardiovascular: Negative for chest pain and palpitations.  Gastrointestinal: Negative for abdominal pain and vomiting.  Endocrine: Negative for polyphagia and polyuria.  Genitourinary: Negative for decreased urine volume, dysuria, enuresis Musculoskeletal: Negative for arthralgias and back pain.  Skin: Negative for color change and rash.  Allergic/Immunologic: Negative for immunocompromised state.  Neurological: Positive for fatigue and weakness.  Negative for tremors, syncope, speech difficulty Hematological: Does not bruise/bleed easily.  All other systems reviewed and are negative   Past Medical History:  Diagnosis Date   Anemia    Arthritis    Asthma    in the past   CHF (congestive heart failure) (HCC)    Chronic kidney disease    Renal failure, dialysis on M/W/F   Diabetes mellitus with complication (HCC)    Type 2   End stage renal disease (HCC)    End stage renal disease on dialysis (HCC)    Gangrene (HCC)    Hemodialysis access, arteriovenous graft (HCC)    Hyperglycemia due to diabetes mellitus (HCC) 10/15/2019   Hyperlipidemia    Hypertension    Neuromuscular disorder (HCC)    Past Surgical History:  Procedure Laterality Date    LEFT BELOW KNEE AMPUTATION (Left Knee)  06/08/2020   A/V FISTULAGRAM Right 02/04/2017   Procedure: A/V Fistulagram;  Surgeon: Maeola Harman, MD;  Location: [facility] INVASIVE CV LAB;  Service: Cardiovascular;  Laterality: Right;   ABDOMINAL AORTOGRAM W/LOWER EXTREMITY N/A 11/18/2019   Procedure: ABDOMINAL AORTOGRAM W/LOWER EXTREMITY;  Surgeon: Cephus Shelling, MD;  Location: [facility] INVASIVE CV LAB;  Service: Cardiovascular;  Laterality: N/A;   ABDOMINAL AORTOGRAM W/LOWER EXTREMITY N/A 02/23/2020   Procedure: ABDOMINAL AORTOGRAM W/LOWER EXTREMITY;  Surgeon: [name], MD;  Location: [place] CV LAB;  Service: Cardiovascular;  Laterality: N/A;  Bilateral   ABDOMINAL AORTOGRAM W/LOWER EXTREMITY N/A 08/10/2020   Procedure:  ABDOMINAL AORTOGRAM W/LOWER EXTREMITY;  Surgeon: [name], MD;  Location: [place] CV LAB;  Service: Cardiovascular;  Laterality: N/A;   AMPUTATION Left 06/08/2020   Procedure: LEFT BELOW KNEE AMPUTATION;  Surgeon: Cherre Robins, MD;  Location: [place];  Service: Vascular;  Laterality: Left;   AMPUTATION Right 09/14/2020   Procedure: RIGHT BELOW KNEE AMPUTATION;  Surgeon: Waynetta Sandy, MD;  Location: [place];  Service: Vascular;  Laterality: Right;   AV FISTULA PLACEMENT Right 08/25/2014   Procedure: RIGHT BRACHIOCEPHALIC ARTERIOVENOUS (AV) FISTULA CREATION;  Surgeon: Mal Misty, MD;  Location: [place];  Service: Vascular;  Laterality: Right;   AV FISTULA PLACEMENT Right 03/18/2019   Procedure: INSERTION OF ARTERIOVENOUS (AV) GORE-TEX GRAFT RIGHT ARM;  Surgeon: Serafina Mitchell, MD;  Location: [place];  Service: Vascular;  Laterality: Right;   [place] Right 12/24/2018   Procedure: FIRST STAGE BASILIC VEIN TRANSPOSITION RIGHT UPPER ARM;  Surgeon: Serafina Mitchell, MD;  Location: [place];  Service: Vascular;  Laterality: Right;   FISTULA SUPERFICIALIZATION Right 11/03/2014   Procedure: RIGHT UPPER ARM FISTULA SUPERFICIALIZATION;  Surgeon: Mal Misty, MD;  Location: [place];  Service: Vascular;  Laterality: Right;   FISTULOGRAM Right 01/16/2016   Procedure: RIGHT UPPER EXTREMITY ARTERIOVENOUS FISTULOGRAM WITH BALLOON ANGIOPLASTY;  Surgeon: Vickie Epley, MD;  Location: [facility];  Service: Vascular;  Laterality: Right;   INSERTION OF DIALYSIS CATHETER     X4   IR FLUORO GUIDE CV LINE RIGHT  12/03/2018   IR REMOVAL TUN CV CATH W/O FL  07/06/2019   IR THROMBECTOMY AV FISTULA W/THROMBOLYSIS/PTA INC/SHUNT/IMG RIGHT Right 12/03/2018   IR THROMBECTOMY AV FISTULA W/THROMBOLYSIS/PTA/STENT INC/SHUNT/IMG RT Right 10/11/2019   IR US GUIDE VASC ACCESS RIGHT  12/03/2018   IR US GUIDE VASC ACCESS RIGHT  12/03/2018   IR US GUIDE VASC ACCESS RIGHT  10/14/2019   LIGATION OF COMPETING  BRANCHES OF ARTERIOVENOUS FISTULA Right 11/03/2014   Procedure: LIGATION OF COMPETING BRANCHE x1 OF RIGHT UPPER ARM ARTERIOVENOUS FISTULA;  Surgeon: Mal Misty, MD;  Location: [place];  Service: Vascular;  Laterality: Right;   PERIPHERAL VASCULAR BALLOON ANGIOPLASTY Right 02/04/2017   Procedure: PERIPHERAL VASCULAR BALLOON ANGIOPLASTY;  Surgeon: Waynetta Sandy, MD;  Location: [place] CV LAB;  Service: Cardiovascular;  Laterality: Right;   PERIPHERAL VASCULAR BALLOON ANGIOPLASTY Right 11/18/2019   Procedure: PERIPHERAL VASCULAR BALLOON ANGIOPLASTY;  Surgeon: [name], MD;  Location: [place] CV LAB;  Service: Cardiovascular;  Laterality: Right;  anterior tibial   PERIPHERAL VASCULAR BALLOON ANGIOPLASTY  02/23/2020   Procedure: PERIPHERAL VASCULAR BALLOON ANGIOPLASTY;  Surgeon: [name], MD;  Location: [place] CV LAB;  Service: Cardiovascular;;  Lt  AT   PERIPHERAL VASCULAR INTERVENTION Right 08/10/2020   Procedure: PERIPHERAL VASCULAR INTERVENTION;  Surgeon: [name], MD;  Location: [place] CV LAB;  Service: Cardiovascular;  Laterality: Right;   TRANSMETATARSAL AMPUTATION Left 02/24/2020   Procedure: TRANSMETATARSAL AMPUTATION LEFT;  Surgeon: Cherre Robins, MD;  Location: [place];  Service: Vascular;  Laterality: Left;   TRANSMETATARSAL AMPUTATION Right 08/11/2020   Procedure: RIGHT TRANSMETATARSAL AMPUTATION;  Surgeon: Waynetta Sandy, MD;  Location: [place];  Service: Vascular;  Laterality: Right;   TUBAL LIGATION      Social History:  reports  that she quit smoking about 36 years ago. Her smoking use included cigarettes. She has never used smokeless tobacco. She reports that she does not drink alcohol and does not use drugs.   Allergies  Allergen Reactions   Sulfa Antibiotics Swelling    Family History  Problem Relation Age of Onset   Diabetes Mother    Hypertension Mother    Cancer Father    Diabetes Father     Hypertension Sister    Diabetes Daughter    Heart disease Daughter    Hypertension Daughter    Diabetes Son    Heart disease Son    Hypertension Son    Peripheral vascular disease Son        amputation     Prior to Admission medications   Medication Sig Start Date End Date Taking? Authorizing Provider  acetaminophen (TYLENOL) 325 MG tablet Take 1-2 tablets (325-650 mg total) by mouth every 4 (four) hours as needed for mild pain. 07/04/20   Love, Ivan Anchors, PA-C  amLODipine (NORVASC) 5 MG tablet TAKE ONE TABLET BY MOUTH AT BEDTIME Patient taking differently: Take 5 mg by mouth daily. 09/06/20   Claretta Fraise, MD  aspirin EC 81 MG tablet Take 81 mg by mouth at bedtime. Swallow whole.    [provider]  atorvastatin (LIPITOR) 40 MG tablet TAKE ONE TABLET BY MOUTH DAILY Patient taking differently: at bedtime. 08/21/20   Claretta Fraise, MD  clopidogrel (PLAVIX) 75 MG tablet Take 1 tablet (75 mg total) by mouth daily. Patient taking differently: Take 75 mg by mouth every evening. 11/18/19 11/17/20  [name], MD  diphenhydrAMINE (BENADRYL) 25 MG tablet Take 25 mg by mouth every 6 (six) hours as needed for itching.    [provider]  gabapentin (NEURONTIN) 300 MG capsule Take 1 capsule (300 mg total) by mouth at bedtime. 07/27/20   Bayard Hugger, NP  insulin detemir (LEVEMIR) 100 UNIT/ML injection Inject 0.16 mLs (16 Units total) into the skin daily. 08/31/20   Claretta Fraise, MD  Insulin Syringe-Needle U-100 28G X 1/2" 1 ML MISC Use with insulin daily Dx E11.22 08/31/20   Claretta Fraise, MD  metoprolol succinate (TOPROL-XL) 50 MG 24 hr tablet TAKE ONE TABLET BY MOUTH DAILY. TAKE WITH OR IMMEDIATELY FOLLOWING A MEAL Patient taking differently: Take 50 mg by mouth at bedtime. 09/06/20   Claretta Fraise, MD  multivitamin (RENA-VIT) TABS tablet Take 1 tablet by mouth at bedtime.     [provider]  oxyCODONE (OXY IR/ROXICODONE) 5 MG immediate release tablet Take 1  tablet (5 mg total) by mouth every 6 (six) hours as needed for severe pain. 09/18/20   Elgergawy, Silver Huguenin, MD  polyethylene glycol (MIRALAX / GLYCOLAX) 17 g packet Take 17 g by mouth daily. Patient taking differently: Take 17 g by mouth daily as needed for mild constipation. 06/17/20   Regalado, Belkys A, MD  senna-docusate (SENOKOT-S) 8.6-50 MG tablet Take 1 tablet by mouth 2 (two) times daily. Patient taking differently: Take 1 tablet by mouth 2 (two) times daily as needed for mild constipation. 07/04/20   Love, Ivan Anchors, PA-C  sevelamer carbonate (RENVELA) 800 MG tablet Take 1,600-2,400 mg by mouth See admin instructions. Take 2400 mg with each meal and 1600 mg with each snack    [provider]  TRULICITY 1.5 YT/0.1SW SOPN INJECT CONTENTs OF ONE PEN UNDER THE SKIN WEEKLY ON SATURDAY Patient taking differently: Inject 1.5 mg into the skin every Saturday. 08/24/20  Mechele Claude, MD    Physical Exam: BP (!) 109/59   Pulse 74   Temp 98 F (36.7 C) (Oral)   Resp 18   LMP  (LMP Unknown)   SpO2 94%   General: 65 y.o. year-old female well developed well nourished in no acute distress.  Alert and oriented x3. HEENT: Dry mucous membrane.  NCAT, EOMI Neck: Supple, trachea medial Cardiovascular: Regular rate and rhythm with no rubs or gallops.  No thyromegaly or JVD noted.   Respiratory: Clear to auscultation with no wheezes or rales. Good inspiratory effort. Abdomen: Soft, nontender nondistended with normal bowel sounds x4 quadrants. Muskuloskeletal: Bilateral BKA.  No cyanosis or clubbing  Neuro: CN II-XII intact, sensation, reflexes intact, no focal neurologic deficit Skin: No ulcerative lesions noted or rashes Psychiatry: Mood is appropriate for condition and setting          Labs on Admission:  Basic Metabolic Panel: Recent Labs  Lab 10/27/20 1851  NA 130*  K 4.0  CL 90*  CO2 25  GLUCOSE 230*  BUN 25*  CREATININE 6.44*  CALCIUM 8.3*   Liver Function Tests: Recent  Labs  Lab 10/27/20 1851  AST 30  ALT 18  ALKPHOS 86  BILITOT 0.5  PROT 7.6  ALBUMIN 2.8*   No results for input(s): LIPASE, AMYLASE in the last 168 hours. No results for input(s): AMMONIA in the last 168 hours. CBC: Recent Labs  Lab 10/27/20 1851  WBC 18.7*  NEUTROABS 15.7*  HGB 9.5*  HCT 32.0*  MCV 81.8  PLT 736*   Cardiac Enzymes: No results for input(s): CKTOTAL, CKMB, CKMBINDEX, TROPONINI in the last 168 hours.  BNP (last 3 results) No results for input(s): BNP in the last 8760 hours.  ProBNP (last 3 results) No results for input(s): PROBNP in the last 8760 hours.  CBG: Recent Labs  Lab 10/27/20 1833  GLUCAP 241*    Radiological Exams on Admission: DG Chest Port 1 View  Result Date: 10/27/2020 CLINICAL DATA:  Confused after dialysis, history of asthma EXAM: PORTABLE CHEST 1 VIEW COMPARISON:  06/22/2020 FINDINGS: Dense retrocardiac and left mid lung opacity partially silhouetting portion of the left heart border. Could reflect a combination of volume loss and airspace consolidation in the lingula. Remaining portions the lungs are otherwise clear aside from basilar atelectasis. Visible portions of the cardiac silhouette suggests some mild cardiomegaly accounting for portable technique. The aorta is calcified. The remaining cardiomediastinal contours are unremarkable. Vascular stenting noted in the right subclavian to axillary vessels. Telemetry leads overlie the chest. Degenerative changes are present in the imaged spine and shoulders. IMPRESSION: Dense retrocardiac and left mid lung opacity partially silhouetting the left heart border. Could reflect acute consolidation or pneumonia with volume loss in the lingula. Cardiomegaly Aortic Atherosclerosis (ICD10-I70.0). Electronically Signed   By: Kreg Shropshire M.D.   On: 10/27/2020 19:46    EKG: I independently viewed the EKG done and my findings are as followed: Normal sinus rhythm at a rate of 86 bpm.  Repolarization  abnormality in anterolateral leads  Assessment/Plan Present on Admission:  CAP (community acquired pneumonia)  Altered mental status  Leucocytosis  Thrombocytosis  Essential (primary) hypertension  PVD (peripheral vascular disease) (HCC)  Active Problems:   Anemia of chronic disease   Essential (primary) hypertension   Hyperglycemia due to diabetes mellitus (HCC)   Leucocytosis   Thrombocytosis   ESRD on hemodialysis (HCC)   PVD (peripheral vascular disease) (HCC)   CAP (community acquired pneumonia)  Altered mental status   Acute respiratory failure with hypoxia (HCC)   Lactic acidosis   Hypoalbuminemia   Hyponatremia   Hyperlipidemia   Altered mental status This has possibly improved since arrival to the ED Continue fall precaution and neurochecks Continue to monitor patient and treat accordingly  Acute respiratory failure with hypoxia secondary to possible CAP POA Chest x-ray was suggestive of acute consolidation/pneumonia COVID test pending Patient was empirically started on IV vancomycin and cefepime, we shall continue with the same at this time with plan to de-escalate based on blood culture, sputum culture, urine Legionella, strep pneumo and procalcitonin Continue Mucinex,incentive spirometry, flutter valve   Leukocytosis (chronic) 18.7, continue to monitor WBC with morning labs  Lactic acidosis possibly secondary to multifactorial Lactic acid 2.9 > 2.0. Continue to monitor lactic acid  Elevated troponin possibly secondary to type II demand ischemia R/O NSTEMI Troponin x2- 79 > 82 > 109, Patient denies chest pain EKG shows normal sinus rhythm at a rate of 82 bpm with repolarization abnormality in inferolateral leads There is no cardiology here at [facility] on weekends, so cardiology on-call at [facility] was consulted and recommended admitting patient to [facility] and for the medicine team to consult with cardiology when patient arrives at [facility] No indication for heparin drip  at this time per cardiologist  Bilateral BKA Patient underwent recent right BKA Continue Tylenol as needed for pain Continue wound care  Hypoalbuminemia Protein supplements will be provided  Hyponatremia possibly due to dehydration Na 130, gentle hydration was provided in the ED Continue to monitor sodium levels with morning labs and limit IV fluid as much as possible  ESRD on HD (MWF) It was unknown if patient completed HD at the dialysis center today Continue [facility] be consulted for maintenance HD   Diabetes mellitus type 2: CBG 230,  last hemoglobin A1c was 6.5 on 08/09/2020.    Continue ISS and hypoglycemia protocol Patient takes 16 units of Levemir at home, we shall start with half dose at this time and titrate up as needed  Anemia of chronic disease: Hemoglobin at baseline   Essential hypertension Metoprolol and amlodipine will be held at this time due to soft BP   Peripheral vascular disease Continue statin,aspirin  and plavix   Thrombocytosis (chronic)  Platelets 736; Continue to monitor   Hyperlipidemia Continue atorvastatin     DVT prophylaxis: Heparin subcu  Code Status: Full code  Family Communication: None at bedside  Disposition Plan:  Patient is from:                        home Anticipated DC to:                   SNF or family members home Anticipated DC date:               2-3 days Anticipated DC barriers:          Patient requires inpatient management due to altered mental status and possible CAP POA   Consults called: Nephrology  Admission status: Inpatient  Bernadette Hoit MD [facility]  10/27/2020, 10:14 PM

## 2020-10-27 NOTE — Progress Notes (Signed)
POST OPERATIVE OFFICE NOTE    CC:  F/u for surgery  HPI: This is a 65 y.o. female who is s/p right below knee amputation on 09/14/20 by Dr. Donzetta Matters.She previously had undergone revascularization followed by right TMA. Unfortunately this failed to heal and she was indicated for a right below knee amputation.  She also has a left BKA.    She was seen a week ago for incision check that reveals areas of black eschar at the incision site.  Her staples were maintained.    Pt returns today for follow up.  The patient has her daughter with her and her mother does not interact verbally.  She is tired and just had HD today.  Allergies  Allergen Reactions   Sulfa Antibiotics Swelling    Current Outpatient Medications  Medication Sig Dispense Refill   acetaminophen (TYLENOL) 325 MG tablet Take 1-2 tablets (325-650 mg total) by mouth every 4 (four) hours as needed for mild pain.     amLODipine (NORVASC) 5 MG tablet TAKE ONE TABLET BY MOUTH AT BEDTIME (Patient taking differently: Take 5 mg by mouth daily.) 30 tablet 3   aspirin EC 81 MG tablet Take 81 mg by mouth at bedtime. Swallow whole.     atorvastatin (LIPITOR) 40 MG tablet TAKE ONE TABLET BY MOUTH DAILY (Patient taking differently: at bedtime.) 90 tablet 0   clopidogrel (PLAVIX) 75 MG tablet Take 1 tablet (75 mg total) by mouth daily. (Patient taking differently: Take 75 mg by mouth every evening.) 30 tablet 11   diphenhydrAMINE (BENADRYL) 25 MG tablet Take 25 mg by mouth every 6 (six) hours as needed for itching.     gabapentin (NEURONTIN) 300 MG capsule Take 1 capsule (300 mg total) by mouth at bedtime. 30 capsule 1   insulin detemir (LEVEMIR) 100 UNIT/ML injection Inject 0.16 mLs (16 Units total) into the skin daily. 5 mL 11   Insulin Syringe-Needle U-100 28G X 1/2" 1 ML MISC Use with insulin daily Dx E11.22 100 each 3   metoprolol succinate (TOPROL-XL) 50 MG 24 hr tablet TAKE ONE TABLET BY MOUTH DAILY. TAKE WITH OR IMMEDIATELY FOLLOWING A MEAL  (Patient taking differently: Take 50 mg by mouth at bedtime.) 30 tablet 3   multivitamin (RENA-VIT) TABS tablet Take 1 tablet by mouth at bedtime.      oxyCODONE (OXY IR/ROXICODONE) 5 MG immediate release tablet Take 1 tablet (5 mg total) by mouth every 6 (six) hours as needed for severe pain. 10 tablet 0   polyethylene glycol (MIRALAX / GLYCOLAX) 17 g packet Take 17 g by mouth daily. (Patient taking differently: Take 17 g by mouth daily as needed for mild constipation.) 14 each 0   senna-docusate (SENOKOT-S) 8.6-50 MG tablet Take 1 tablet by mouth 2 (two) times daily. (Patient taking differently: Take 1 tablet by mouth 2 (two) times daily as needed for mild constipation.) 60 tablet 0   sevelamer carbonate (RENVELA) 800 MG tablet Take 1,600-2,400 mg by mouth See admin instructions. Take 2400 mg with each meal and 1600 mg with each snack     TRULICITY 1.5 0000000 SOPN INJECT CONTENTs OF ONE PEN UNDER THE SKIN WEEKLY ON SATURDAY (Patient taking differently: Inject 1.5 mg into the skin every Saturday.) 2 mL 0   No current facility-administered medications for this visit.     ROS:  See HPI  Physical Exam:      The stump is cool to touch and the incision has ischemic changes.  The is no  drainage or openings in the skin.  No sign of infection.   Assessment/Plan:  This is a 65 y.o. female who is s/p:Right BKA with ischemic changes. The daughter wants to give the BKA more time to see if it will heal.  DR. Donzetta Matters examined the stump as well.  They decided to keep the staples in place and have her follow up in 3 weeks.  The daughter agrees with this plan.       Roxy Horseman PA-C [facility] [phone]   Clinic MD:  Donzetta Matters

## 2020-10-27 NOTE — Progress Notes (Signed)
Pharmacy Antibiotic Note  Christy Zhang is a 65 y.o. female admitted on 10/27/2020 with  unknown source .  Pharmacy has been consulted for vancomycin/cefepime dosing. Of note, patient is ESRD on HD. Usual schedule is MWF, and she did go to HD today, but unsure if full session was completed or not. Given 1000 mg vancomycin dose, will repeat for a total load of 2000 mg.  Plan: Vancomycin 2000 mg IV x 1.  Follow up HD plans/schedule and tolerance for additional doses Cefepime 1 gm IV every 24 hours Monitor clinical progress, cultures/sensitivities, abx plan.Vancomycin levels as indicated     Temp (24hrs), Avg:98 F (36.7 C), Min:98 F (36.7 C), Max:98 F (36.7 C)  Recent Labs  Lab 10/27/20 1851 10/27/20 1927  WBC 18.7*  --   CREATININE 6.44*  --   LATICACIDVEN  --  2.9*    CrCl cannot be calculated (Unknown ideal weight.).    Allergies  Allergen Reactions   Sulfa Antibiotics Swelling     Thank you for allowing pharmacy to be a part of this patient's care.  Jens Som, PharmD 10/27/2020 10:17 PM

## 2020-10-27 NOTE — ED Provider Notes (Signed)
[facility] EMERGENCY DEPARTMENT Provider Note   CSN: [id number] Arrival date & time: 10/27/20  1820     History Chief Complaint  Patient presents with   Altered Mental Status    Christy Zhang is a 65 y.o. female.  Patient complains of some shortness of breath weakness and cough with lethargy  The history is provided by the patient and medical records.  Altered Mental Status Presenting symptoms: no behavior changes   Severity:  Moderate Most recent episode:  Today Episode history:  Multiple Timing:  Constant Progression:  Waxing and waning Chronicity:  New Context: not alcohol use   Associated symptoms: no abdominal pain       Past Medical History:  Diagnosis Date   Anemia    Arthritis    Asthma    in the past   CHF (congestive heart failure) (HCC)    Chronic kidney disease    Renal failure, dialysis on M/W/F   Diabetes mellitus with complication ([place])    Type 2   End stage renal disease ([place])    End stage renal disease on dialysis ([place])    Gangrene ([place])    Hemodialysis access, arteriovenous graft ([place])    Hyperglycemia due to diabetes mellitus ([place]) 10/15/2019   Hyperlipidemia    Hypertension    Neuromuscular disorder [facility])     Patient Active Problem List   Diagnosis Date Noted   S/P BKA (below knee amputation) unilateral, right ([place]) 09/14/2020   Diabetic foot infection ([place])    Medication monitoring encounter    Pressure injury of skin 08/11/2020   Thrombocytosis 08/09/2020   Osteomyelitis of right foot ([place]) 08/08/2020   Leucocytosis 07/05/2020   Below-knee amputation of left lower extremity ([place]) 06/16/2020   S/P BKA (below knee amputation) unilateral ([place]) 06/08/2020   Ischemic ulcer of toe of right foot ([place]) 04/13/2020   PVD (peripheral vascular disease) ([place]) 04/13/2020   ESRD on hemodialysis ([place]) 01/27/2020   Abdominal wall cellulitis    Panniculitis 12/29/2019   Osteomyelitis ([place]) 10/15/2019   Osteomyelitis of third toe of right  foot ([place]) 10/14/2019   ESRD (end stage renal disease) ([place])    Asthma, mild persistent 07/25/2015   Hypertension secondary to other renal disorders 07/25/2015   Controlled type 2 diabetes mellitus with complication, with long-term current use of insulin ([place]) 03/30/2015   Anemia in chronic kidney disease 02/17/2015   Osteopathy in diseases classified elsewhere, unspecified site 02/17/2015   Essential (primary) hypertension 06/24/2014   Heart failure ([place]) 06/24/2014   Obesity, Class III, BMI 40-49.9 (morbid obesity) ([place]) 06/24/2014    Past Surgical History:  Procedure Laterality Date    LEFT BELOW KNEE AMPUTATION (Left Knee)  06/08/2020   A/V FISTULAGRAM Right 02/04/2017   Procedure: A/V Fistulagram;  Surgeon: Waynetta Sandy, MD;  Location: [place] [facility];  Service: Cardiovascular;  Laterality: Right;   ABDOMINAL AORTOGRAM W/LOWER EXTREMITY N/A 11/18/2019   Procedure: ABDOMINAL AORTOGRAM W/LOWER EXTREMITY;  Surgeon: [name], MD;  Location: [place] [facility];  Service: Cardiovascular;  Laterality: N/A;   ABDOMINAL AORTOGRAM W/LOWER EXTREMITY N/A 02/23/2020   Procedure: ABDOMINAL AORTOGRAM W/LOWER EXTREMITY;  Surgeon: [name], MD;  Location: [place] [facility];  Service: Cardiovascular;  Laterality: N/A;  Bilateral   ABDOMINAL AORTOGRAM W/LOWER EXTREMITY N/A 08/10/2020   Procedure: ABDOMINAL AORTOGRAM W/LOWER EXTREMITY;  Surgeon: [name], MD;  Location: [place] [facility];  Service: Cardiovascular;  Laterality: N/A;   AMPUTATION Left 06/08/2020  Procedure: LEFT BELOW KNEE AMPUTATION;  Surgeon: Cherre Robins, MD;  Location: [place];  Service: Vascular;  Laterality: Left;   AMPUTATION Right 09/14/2020   Procedure: RIGHT BELOW KNEE AMPUTATION;  Surgeon: Waynetta Sandy, MD;  Location: [place];  Service: Vascular;  Laterality: Right;   AV FISTULA PLACEMENT Right 08/25/2014   Procedure: RIGHT BRACHIOCEPHALIC ARTERIOVENOUS (AV) FISTULA  CREATION;  Surgeon: Mal Misty, MD;  Location: [place];  Service: Vascular;  Laterality: Right;   AV FISTULA PLACEMENT Right 03/18/2019   Procedure: INSERTION OF ARTERIOVENOUS (AV) GORE-TEX GRAFT RIGHT ARM;  Surgeon: Serafina Mitchell, MD;  Location: [place];  Service: Vascular;  Laterality: Right;   [place] Right 12/24/2018   Procedure: FIRST STAGE BASILIC VEIN TRANSPOSITION RIGHT UPPER ARM;  Surgeon: Serafina Mitchell, MD;  Location: [place];  Service: Vascular;  Laterality: Right;   FISTULA SUPERFICIALIZATION Right 11/03/2014   Procedure: RIGHT UPPER ARM FISTULA SUPERFICIALIZATION;  Surgeon: Mal Misty, MD;  Location: [place];  Service: Vascular;  Laterality: Right;   FISTULOGRAM Right 01/16/2016   Procedure: RIGHT UPPER EXTREMITY ARTERIOVENOUS FISTULOGRAM WITH BALLOON ANGIOPLASTY;  Surgeon: Vickie Epley, MD;  Location: [facility];  Service: Vascular;  Laterality: Right;   INSERTION OF DIALYSIS CATHETER     X4   IR FLUORO GUIDE CV LINE RIGHT  12/03/2018   IR REMOVAL TUN CV CATH W/O FL  07/06/2019   IR THROMBECTOMY AV FISTULA W/THROMBOLYSIS/PTA INC/SHUNT/IMG RIGHT Right 12/03/2018   IR THROMBECTOMY AV FISTULA W/THROMBOLYSIS/PTA/STENT INC/SHUNT/IMG RT Right 10/11/2019   IR US GUIDE VASC ACCESS RIGHT  12/03/2018   IR US GUIDE VASC ACCESS RIGHT  12/03/2018   IR US GUIDE VASC ACCESS RIGHT  10/14/2019   LIGATION OF COMPETING BRANCHES OF ARTERIOVENOUS FISTULA Right 11/03/2014   Procedure: LIGATION OF COMPETING BRANCHE x1 OF RIGHT UPPER ARM ARTERIOVENOUS FISTULA;  Surgeon: Mal Misty, MD;  Location: [place];  Service: Vascular;  Laterality: Right;   PERIPHERAL VASCULAR BALLOON ANGIOPLASTY Right 02/04/2017   Procedure: PERIPHERAL VASCULAR BALLOON ANGIOPLASTY;  Surgeon: Waynetta Sandy, MD;  Location: [place] [facility];  Service: Cardiovascular;  Laterality: Right;   PERIPHERAL VASCULAR BALLOON ANGIOPLASTY Right 11/18/2019   Procedure: PERIPHERAL VASCULAR BALLOON ANGIOPLASTY;  Surgeon:  [name], MD;  Location: [place] [facility];  Service: Cardiovascular;  Laterality: Right;  anterior tibial   PERIPHERAL VASCULAR BALLOON ANGIOPLASTY  02/23/2020   Procedure: PERIPHERAL VASCULAR BALLOON ANGIOPLASTY;  Surgeon: [name], MD;  Location: [place] [facility];  Service: Cardiovascular;;  Lt  AT   PERIPHERAL VASCULAR INTERVENTION Right 08/10/2020   Procedure: PERIPHERAL VASCULAR INTERVENTION;  Surgeon: [name], MD;  Location: [place] [facility];  Service: Cardiovascular;  Laterality: Right;   TRANSMETATARSAL AMPUTATION Left 02/24/2020   Procedure: TRANSMETATARSAL AMPUTATION LEFT;  Surgeon: Cherre Robins, MD;  Location: [place];  Service: Vascular;  Laterality: Left;   TRANSMETATARSAL AMPUTATION Right 08/11/2020   Procedure: RIGHT TRANSMETATARSAL AMPUTATION;  Surgeon: Waynetta Sandy, MD;  Location: [place];  Service: Vascular;  Laterality: Right;   TUBAL LIGATION       OB History   No obstetric history on file.     Family History  Problem Relation Age of Onset   Diabetes Mother    Hypertension Mother    Cancer Father    Diabetes Father    Hypertension Sister    Diabetes Daughter    Heart disease Daughter    Hypertension Daughter  Diabetes Son    Heart disease Son    Hypertension Son    Peripheral vascular disease Son        amputation    Social History   Tobacco Use   Smoking status: Former    Pack years: 0.00    Types: Cigarettes    Quit date: 07/25/1984    Years since quitting: 36.2   Smokeless tobacco: Never  Vaping Use   Vaping Use: Never used  Substance Use Topics   Alcohol use: No    Alcohol/week: 0.0 standard drinks   Drug use: No    Home Medications Prior to Admission medications   Medication Sig Start Date End Date Taking? Authorizing Provider  acetaminophen (TYLENOL) 325 MG tablet Take 1-2 tablets (325-650 mg total) by mouth every 4 (four) hours as needed for mild pain. 07/04/20   Love, Ivan Anchors, PA-C   amLODipine (NORVASC) 5 MG tablet TAKE ONE TABLET BY MOUTH AT BEDTIME Patient taking differently: Take 5 mg by mouth daily. 09/06/20   Claretta Fraise, MD  aspirin EC 81 MG tablet Take 81 mg by mouth at bedtime. Swallow whole.    [provider]  atorvastatin (LIPITOR) 40 MG tablet TAKE ONE TABLET BY MOUTH DAILY Patient taking differently: at bedtime. 08/21/20   Claretta Fraise, MD  clopidogrel (PLAVIX) 75 MG tablet Take 1 tablet (75 mg total) by mouth daily. Patient taking differently: Take 75 mg by mouth every evening. 11/18/19 11/17/20  [name], MD  diphenhydrAMINE (BENADRYL) 25 MG tablet Take 25 mg by mouth every 6 (six) hours as needed for itching.    [provider]  gabapentin (NEURONTIN) 300 MG capsule Take 1 capsule (300 mg total) by mouth at bedtime. 07/27/20   Bayard Hugger, NP  insulin detemir (LEVEMIR) 100 UNIT/ML injection Inject 0.16 mLs (16 Units total) into the skin daily. 08/31/20   Claretta Fraise, MD  Insulin Syringe-Needle U-100 28G X 1/2" 1 ML MISC Use with insulin daily Dx E11.22 08/31/20   Claretta Fraise, MD  metoprolol succinate (TOPROL-XL) 50 MG 24 hr tablet TAKE ONE TABLET BY MOUTH DAILY. TAKE WITH OR IMMEDIATELY FOLLOWING A MEAL Patient taking differently: Take 50 mg by mouth at bedtime. 09/06/20   Claretta Fraise, MD  multivitamin (RENA-VIT) TABS tablet Take 1 tablet by mouth at bedtime.     [provider]  oxyCODONE (OXY IR/ROXICODONE) 5 MG immediate release tablet Take 1 tablet (5 mg total) by mouth every 6 (six) hours as needed for severe pain. 09/18/20   Elgergawy, Silver Huguenin, MD  polyethylene glycol (MIRALAX / GLYCOLAX) 17 g packet Take 17 g by mouth daily. Patient taking differently: Take 17 g by mouth daily as needed for mild constipation. 06/17/20   Regalado, Belkys A, MD  senna-docusate (SENOKOT-S) 8.6-50 MG tablet Take 1 tablet by mouth 2 (two) times daily. Patient taking differently: Take 1 tablet by mouth 2 (two) times daily as  needed for mild constipation. 07/04/20   Love, Ivan Anchors, PA-C  sevelamer carbonate (RENVELA) 800 MG tablet Take 1,600-2,400 mg by mouth See admin instructions. Take 2400 mg with each meal and 1600 mg with each snack    [provider]  TRULICITY 1.5 0000000 SOPN INJECT CONTENTs OF ONE PEN UNDER THE SKIN WEEKLY ON SATURDAY Patient taking differently: Inject 1.5 mg into the skin every Saturday. 08/24/20   Claretta Fraise, MD    Allergies    Sulfa antibiotics  Review of Systems   Review of Systems  Unable to perform ROS: Mental status change  Gastrointestinal:  Negative for abdominal pain.   Physical Exam Updated Vital Signs BP (!) 109/59   Pulse 74   Temp 98 F (36.7 C) (Oral)   Resp 18   LMP  (LMP Unknown)   SpO2 94%   Physical Exam Vitals and nursing note reviewed.  Constitutional:      Appearance: She is well-developed.  HENT:     Head: Normocephalic.     Nose: Nose normal.  Eyes:     General: No scleral icterus.    Conjunctiva/sclera: Conjunctivae normal.  Neck:     Thyroid: No thyromegaly.  Cardiovascular:     Rate and Rhythm: Normal rate and regular rhythm.     Heart sounds: No murmur heard.   No friction rub. No gallop.  Pulmonary:     Breath sounds: No stridor. No wheezing or rales.  Chest:     Chest wall: No tenderness.  Abdominal:     General: There is no distension.     Tenderness: There is no abdominal tenderness. There is no rebound.  Musculoskeletal:        General: Normal range of motion.     Cervical back: Neck supple.  Lymphadenopathy:     Cervical: No cervical adenopathy.  Skin:    Findings: No erythema or rash.  Neurological:     Mental Status: She is alert.     Motor: No abnormal muscle tone.     Coordination: Coordination normal.     Comments: Oriented to person  Psychiatric:        Behavior: Behavior normal.    ED Results / Procedures / Treatments   Labs (all labs ordered are listed, but only abnormal results are  displayed) Labs Reviewed  LACTIC ACID, PLASMA - Abnormal; Notable for the following components:      Result Value   Lactic Acid, Venous 2.9 (*)    All other components within normal limits  COMPREHENSIVE METABOLIC PANEL - Abnormal; Notable for the following components:   Sodium 130 (*)    Chloride 90 (*)    Glucose, Bld 230 (*)    BUN 25 (*)    Creatinine, Ser 6.44 (*)    Calcium 8.3 (*)    Albumin 2.8 (*)    GFR, Estimated 7 (*)    All other components within normal limits  CBC WITH DIFFERENTIAL/PLATELET - Abnormal; Notable for the following components:   WBC 18.7 (*)    Hemoglobin 9.5 (*)    HCT 32.0 (*)    MCH 24.3 (*)    MCHC 29.7 (*)    RDW 18.9 (*)    Platelets 736 (*)    nRBC 1.2 (*)    Neutro Abs 15.7 (*)    Abs Immature Granulocytes 0.37 (*)    All other components within normal limits  PROTIME-INR - Abnormal; Notable for the following components:   Prothrombin Time 18.3 (*)    INR 1.5 (*)    All other components within normal limits  CBG MONITORING, ED - Abnormal; Notable for the following components:   Glucose-Capillary 241 (*)    All other components within normal limits  RESP PANEL BY RT-PCR (FLU A&B, COVID) ARPGX2  CULTURE, BLOOD (ROUTINE X 2)  URINE CULTURE  APTT  LACTIC ACID, PLASMA  URINALYSIS, ROUTINE W REFLEX MICROSCOPIC    EKG None  Radiology DG Chest Port 1 View  Result Date: 10/27/2020 CLINICAL DATA:  Confused after dialysis, history of asthma  EXAM: PORTABLE CHEST 1 VIEW COMPARISON:  06/22/2020 FINDINGS: Dense retrocardiac and left mid lung opacity partially silhouetting portion of the left heart border. Could reflect a combination of volume loss and airspace consolidation in the lingula. Remaining portions the lungs are otherwise clear aside from basilar atelectasis. Visible portions of the cardiac silhouette suggests some mild cardiomegaly accounting for portable technique. The aorta is calcified. The remaining cardiomediastinal contours are  unremarkable. Vascular stenting noted in the right subclavian to axillary vessels. Telemetry leads overlie the chest. Degenerative changes are present in the imaged spine and shoulders. IMPRESSION: Dense retrocardiac and left mid lung opacity partially silhouetting the left heart border. Could reflect acute consolidation or pneumonia with volume loss in the lingula. Cardiomegaly Aortic Atherosclerosis (ICD10-I70.0). Electronically Signed   By: Lovena Le M.D.   On: 10/27/2020 19:46    Procedures Procedures   Medications Ordered in ED Medications  metroNIDAZOLE (FLAGYL) IVPB 500 mg (500 mg Intravenous New Bag/Given 10/27/20 1956)  vancomycin (VANCOCIN) IVPB 1000 mg/200 mL premix (1,000 mg Intravenous New Bag/Given 10/27/20 1931)  ceFEPIme (MAXIPIME) 2 g in sodium chloride 0.9 % 100 mL IVPB (2 g Intravenous New Bag/Given 10/27/20 1935)  sodium chloride 0.9 % bolus 500 mL (500 mLs Intravenous New Bag/Given 10/27/20 1930)    ED Course  I have reviewed the triage vital signs and the nursing notes.  Pertinent labs & imaging results that were available during my care of the patient were reviewed by me and considered in my medical decision making (see chart for details). CRITICAL CARE Performed by: Milton Ferguson Total critical care time: 40 minutes Critical care time was exclusive of separately billable procedures and treating other patients. Critical care was necessary to treat or prevent imminent or life-threatening deterioration. Critical care was time spent personally by me on the following activities: development of treatment plan with patient and/or surrogate as well as nursing, discussions with consultants, evaluation of patient's response to treatment, examination of patient, obtaining history from patient or surrogate, ordering and performing treatments and interventions, ordering and review of laboratory studies, ordering and review of radiographic studies, pulse oximetry and re-evaluation of  patient's condition.    MDM Rules/Calculators/A&P                          Patient with pneumonia she will be admitted to medicine Final Clinical Impression(s) / ED Diagnoses Final diagnoses:  Community acquired pneumonia of left lower lobe of lung    Rx / DC Orders ED Discharge Orders     None        Milton Ferguson, MD 10/31/20 1043

## 2020-10-28 ENCOUNTER — Inpatient Hospital Stay (HOSPITAL_COMMUNITY): Payer: Medicare Other

## 2020-10-28 ENCOUNTER — Encounter (HOSPITAL_COMMUNITY): Payer: Self-pay | Admitting: Internal Medicine

## 2020-10-28 DIAGNOSIS — I480 Paroxysmal atrial fibrillation: Secondary | ICD-10-CM

## 2020-10-28 DIAGNOSIS — N186 End stage renal disease: Secondary | ICD-10-CM | POA: Diagnosis not present

## 2020-10-28 DIAGNOSIS — I10 Essential (primary) hypertension: Secondary | ICD-10-CM | POA: Diagnosis not present

## 2020-10-28 DIAGNOSIS — J9601 Acute respiratory failure with hypoxia: Secondary | ICD-10-CM | POA: Diagnosis not present

## 2020-10-28 DIAGNOSIS — J189 Pneumonia, unspecified organism: Secondary | ICD-10-CM | POA: Diagnosis not present

## 2020-10-28 LAB — URINALYSIS, ROUTINE W REFLEX MICROSCOPIC
Bilirubin Urine: NEGATIVE
Glucose, UA: NEGATIVE mg/dL
Ketones, ur: NEGATIVE mg/dL
Nitrite: NEGATIVE
Protein, ur: 100 mg/dL — AB
RBC / HPF: >50 RBC/hpf — ABNORMAL HIGH (ref 0–5)
Specific Gravity, Urine: 1.014 (ref 1.005–1.030)
Squamous Epithelial / HPF: >50 — ABNORMAL HIGH (ref 0–5)
WBC, UA: >50 WBC/hpf — ABNORMAL HIGH (ref 0–5)
pH: 5 (ref 5.0–8.0)

## 2020-10-28 LAB — PROTIME-INR
INR: 1.5 — ABNORMAL HIGH (ref 0.8–1.2)
Prothrombin Time: 17.9 seconds — ABNORMAL HIGH (ref 11.4–15.2)

## 2020-10-28 LAB — COMPREHENSIVE METABOLIC PANEL
ALT: 23 U/L (ref 0–44)
AST: 37 U/L (ref 15–41)
Albumin: 2.5 g/dL — ABNORMAL LOW (ref 3.5–5.0)
Alkaline Phosphatase: 63 U/L (ref 38–126)
Anion gap: 11 (ref 5–15)
BUN: 25 mg/dL — ABNORMAL HIGH (ref 8–23)
CO2: 25 mmol/L (ref 22–32)
Calcium: 7.6 mg/dL — ABNORMAL LOW (ref 8.9–10.3)
Chloride: 95 mmol/L — ABNORMAL LOW (ref 98–111)
Creatinine, Ser: 6.31 mg/dL — ABNORMAL HIGH (ref 0.44–1.00)
GFR, Estimated: 7 mL/min — ABNORMAL LOW (ref >60)
Glucose, Bld: 207 mg/dL — ABNORMAL HIGH (ref 70–99)
Potassium: 3.9 mmol/L (ref 3.5–5.1)
Sodium: 131 mmol/L — ABNORMAL LOW (ref 135–145)
Total Bilirubin: 0.5 mg/dL (ref 0.3–1.2)
Total Protein: 6.7 g/dL (ref 6.5–8.1)

## 2020-10-28 LAB — TROPONIN I (HIGH SENSITIVITY)
Troponin I (High Sensitivity): 82 ng/L — ABNORMAL HIGH (ref <18)
Troponin I (High Sensitivity): 109 ng/L (ref <18)
Troponin I (High Sensitivity): 107 ng/L (ref <18)

## 2020-10-28 LAB — PHOSPHORUS: Phosphorus: 5.7 mg/dL — ABNORMAL HIGH (ref 2.5–4.6)

## 2020-10-28 LAB — BLOOD GAS, ARTERIAL
Acid-base deficit: 0.3 mmol/L (ref 0.0–2.0)
Bicarbonate: 26.7 mmol/L (ref 20.0–28.0)
Drawn by: 33099
FIO2: 100
O2 Saturation: 97.7 %
Patient temperature: 36.8
pCO2 arterial: 67.9 mmHg (ref 32.0–48.0)
pH, Arterial: 7.217 — ABNORMAL LOW (ref 7.350–7.450)
pO2, Arterial: 129 mmHg — ABNORMAL HIGH (ref 83.0–108.0)

## 2020-10-28 LAB — CBC
HCT: 29.4 % — ABNORMAL LOW (ref 36.0–46.0)
Hemoglobin: 8.8 g/dL — ABNORMAL LOW (ref 12.0–15.0)
MCH: 24.6 pg — ABNORMAL LOW (ref 26.0–34.0)
MCHC: 29.9 g/dL — ABNORMAL LOW (ref 30.0–36.0)
MCV: 82.4 fL (ref 80.0–100.0)
Platelets: 649 10*3/uL — ABNORMAL HIGH (ref 150–400)
RBC: 3.57 MIL/uL — ABNORMAL LOW (ref 3.87–5.11)
RDW: 18.7 % — ABNORMAL HIGH (ref 11.5–15.5)
WBC: 17.5 10*3/uL — ABNORMAL HIGH (ref 4.0–10.5)
nRBC: 1 % — ABNORMAL HIGH (ref 0.0–0.2)

## 2020-10-28 LAB — RESP PANEL BY RT-PCR (FLU A&B, COVID) ARPGX2
Influenza A by PCR: NEGATIVE
Influenza B by PCR: NEGATIVE
SARS Coronavirus 2 by RT PCR: NEGATIVE

## 2020-10-28 LAB — HIV ANTIBODY (ROUTINE TESTING W REFLEX): HIV Screen 4th Generation wRfx: NONREACTIVE

## 2020-10-28 LAB — GLUCOSE, CAPILLARY
Glucose-Capillary: 201 mg/dL — ABNORMAL HIGH (ref 70–99)
Glucose-Capillary: 178 mg/dL — ABNORMAL HIGH (ref 70–99)

## 2020-10-28 LAB — PROCALCITONIN: Procalcitonin: 4.2 ng/mL

## 2020-10-28 LAB — LACTIC ACID, PLASMA
Lactic Acid, Venous: 2 mmol/L (ref 0.5–1.9)
Lactic Acid, Venous: 1.3 mmol/L (ref 0.5–1.9)

## 2020-10-28 LAB — CBG MONITORING, ED
Glucose-Capillary: 162 mg/dL — ABNORMAL HIGH (ref 70–99)
Glucose-Capillary: 195 mg/dL — ABNORMAL HIGH (ref 70–99)

## 2020-10-28 LAB — MAGNESIUM: Magnesium: 1.6 mg/dL — ABNORMAL LOW (ref 1.7–2.4)

## 2020-10-28 LAB — APTT: aPTT: 33 seconds (ref 24–36)

## 2020-10-28 MED ORDER — ORAL CARE MOUTH RINSE
15.0000 mL | Freq: Two times a day (BID) | OROMUCOSAL | Status: DC
  Administered 2020-10-29: 15 mL via OROMUCOSAL

## 2020-10-28 MED ORDER — SEVELAMER CARBONATE 800 MG PO TABS: 1600.0000 mg | ORAL_TABLET | ORAL | Status: DC

## 2020-10-28 MED ORDER — ATORVASTATIN CALCIUM 40 MG PO TABS
40.0000 mg | ORAL_TABLET | Freq: Every day | ORAL | Status: DC
  Administered 2020-10-28: 40 mg via ORAL
  Filled 2020-10-28 (×2): qty 1

## 2020-10-28 MED ORDER — IPRATROPIUM-ALBUTEROL 0.5-2.5 (3) MG/3ML IN SOLN
3.0000 mL | Freq: Three times a day (TID) | RESPIRATORY_TRACT | Status: DC
  Administered 2020-10-28 – 2020-11-05 (×22): 3 mL via RESPIRATORY_TRACT
  Filled 2020-10-28 (×22): qty 3

## 2020-10-28 MED ORDER — VANCOMYCIN HCL IN DEXTROSE 750-5 MG/150ML-% IV SOLN
750.0000 mg | INTRAVENOUS | Status: DC
  Filled 2020-10-28: qty 150

## 2020-10-28 MED ORDER — HYDRALAZINE HCL 20 MG/ML IJ SOLN: 10.0000 mg | Freq: Four times a day (QID) | INTRAMUSCULAR | Status: DC | PRN

## 2020-10-28 MED ORDER — METOPROLOL TARTRATE 5 MG/5ML IV SOLN
2.5000 mg | Freq: Four times a day (QID) | INTRAVENOUS | Status: DC | PRN
  Administered 2020-10-28: 2.5 mg via INTRAVENOUS
  Filled 2020-10-28: qty 5

## 2020-10-28 MED ORDER — ASPIRIN EC 81 MG PO TBEC: 81.0000 mg | DELAYED_RELEASE_TABLET | Freq: Every day | ORAL | Status: DC

## 2020-10-28 MED ORDER — MAGNESIUM SULFATE 2 GM/50ML IV SOLN
2.0000 g | Freq: Once | INTRAVENOUS | Status: AC
  Administered 2020-10-28: 2 g via INTRAVENOUS
  Filled 2020-10-28: qty 50

## 2020-10-28 MED ORDER — ASPIRIN EC 81 MG PO TBEC
81.0000 mg | DELAYED_RELEASE_TABLET | Freq: Every day | ORAL | Status: DC
  Filled 2020-10-28: qty 1

## 2020-10-28 MED ORDER — VANCOMYCIN HCL IN DEXTROSE 1-5 GM/200ML-% IV SOLN: 1000.0000 mg | INTRAVENOUS | Status: DC

## 2020-10-28 MED ORDER — ALBUTEROL SULFATE (2.5 MG/3ML) 0.083% IN NEBU: 2.5000 mg | INHALATION_SOLUTION | RESPIRATORY_TRACT | Status: DC | PRN

## 2020-10-28 MED ORDER — SEVELAMER CARBONATE 800 MG PO TABS: 1600.0000 mg | ORAL_TABLET | Freq: Two times a day (BID) | ORAL | Status: DC | PRN

## 2020-10-28 MED ORDER — DM-GUAIFENESIN ER 30-600 MG PO TB12
1.0000 | ORAL_TABLET | Freq: Two times a day (BID) | ORAL | Status: DC
  Administered 2020-10-28: 1 via ORAL
  Filled 2020-10-28 (×2): qty 1

## 2020-10-28 MED ORDER — METOPROLOL SUCCINATE ER 50 MG PO TB24
50.0000 mg | ORAL_TABLET | Freq: Every day | ORAL | Status: DC
  Administered 2020-10-28: 50 mg via ORAL
  Filled 2020-10-28: qty 1

## 2020-10-28 MED ORDER — GUAIFENESIN ER 600 MG PO TB12
600.0000 mg | ORAL_TABLET | Freq: Two times a day (BID) | ORAL | Status: DC
  Administered 2020-10-28: 600 mg via ORAL
  Filled 2020-10-28 (×2): qty 1

## 2020-10-28 MED ORDER — CLOPIDOGREL BISULFATE 75 MG PO TABS
75.0000 mg | ORAL_TABLET | Freq: Every day | ORAL | Status: DC
  Administered 2020-10-28: 75 mg via ORAL
  Filled 2020-10-28 (×2): qty 1

## 2020-10-28 MED ORDER — SEVELAMER CARBONATE 800 MG PO TABS
2400.0000 mg | ORAL_TABLET | Freq: Three times a day (TID) | ORAL | Status: DC
  Administered 2020-10-28 (×2): 2400 mg via ORAL
  Filled 2020-10-28 (×2): qty 3

## 2020-10-28 NOTE — Consult Note (Addendum)
[facility] Nephrology Consult Note: Reason for Consult: To manage dialysis and dialysis related needs Referring Physician: Dr Denton Brick, [place]  HPI:  Christy Zhang is an 65 y.o. female with history of HTN, HLD, peripheral vascular disease status post bilateral BKA, DM, anemia, ESRD on HD MWF at [facility] presented with cough, shortness of breath and generalized weakness, seen as a consultation for the management of dialysis. Apparently patient was more confused and lethargic after arrival from the dialysis yesterday.  The EMS was called and patient was brought to the ER for further evaluation and management. In the ER, the blood pressure was 92/68, she was hypoxic requiring about 5 L of oxygen.  Labs showed leukocytosis, elevated troponin level, hyponatremia and a chest x-ray consistent with left lung pneumonia.  She was started on cefepime and vancomycin and admitted for further evaluation of pneumonia. On my evaluation, patient was falling back to sleep easily.  When she is awake she was able to tell me that she had dialysis about 3 hours yesterday.  She denies any symptoms currently however her review of system is limited because of her confusion.  Dialysis Orders from previous admission:  [facility] - MWF 4 hrs 400/500 97 kg 2.0K/2.5 Ca 16g needles  -Heparin 1000 units initial bolus-heparin pump 1000 units/hr stop 1 hour prior to end of tx  -Epogen 14000 units IV TIW -Hectorol 1.5 mcg IV TIW  Past Medical History:  Diagnosis Date   Anemia    Arthritis    Asthma    in the past   CHF (congestive heart failure) ([place])    Chronic kidney disease    Renal failure, dialysis on M/W/F   Diabetes mellitus with complication (HCC)    Type 2   End stage renal disease ([place])    End stage renal disease on dialysis ([place])    Gangrene ([place])    Hemodialysis access, arteriovenous graft ([place])    Hyperglycemia due to diabetes mellitus ([place]) 10/15/2019   Hyperlipidemia     Hypertension    Neuromuscular disorder ([place])     Past Surgical History:  Procedure Laterality Date    LEFT BELOW KNEE AMPUTATION (Left Knee)  06/08/2020   A/V FISTULAGRAM Right 02/04/2017   Procedure: A/V Fistulagram;  Surgeon: Waynetta Sandy, MD;  Location: [place] [facility];  Service: Cardiovascular;  Laterality: Right;   ABDOMINAL AORTOGRAM W/LOWER EXTREMITY N/A 11/18/2019   Procedure: ABDOMINAL AORTOGRAM W/LOWER EXTREMITY;  Surgeon: [name], MD;  Location: [place] [facility];  Service: Cardiovascular;  Laterality: N/A;   ABDOMINAL AORTOGRAM W/LOWER EXTREMITY N/A 02/23/2020   Procedure: ABDOMINAL AORTOGRAM W/LOWER EXTREMITY;  Surgeon: [name], MD;  Location: [place] [facility];  Service: Cardiovascular;  Laterality: N/A;  Bilateral   ABDOMINAL AORTOGRAM W/LOWER EXTREMITY N/A 08/10/2020   Procedure: ABDOMINAL AORTOGRAM W/LOWER EXTREMITY;  Surgeon: [name], MD;  Location: [place] [facility];  Service: Cardiovascular;  Laterality: N/A;   AMPUTATION Left 06/08/2020   Procedure: LEFT BELOW KNEE AMPUTATION;  Surgeon: Cherre Robins, MD;  Location: [place];  Service: Vascular;  Laterality: Left;   AMPUTATION Right 09/14/2020   Procedure: RIGHT BELOW KNEE AMPUTATION;  Surgeon: Waynetta Sandy, MD;  Location: [place];  Service: Vascular;  Laterality: Right;   AV FISTULA PLACEMENT Right 08/25/2014   Procedure: RIGHT BRACHIOCEPHALIC ARTERIOVENOUS (AV) FISTULA CREATION;  Surgeon: Mal Misty, MD;  Location: [place];  Service: Vascular;  Laterality: Right;   AV FISTULA PLACEMENT  Right 03/18/2019   Procedure: INSERTION OF ARTERIOVENOUS (AV) GORE-TEX GRAFT RIGHT ARM;  Surgeon: Serafina Mitchell, MD;  Location: [place];  Service: Vascular;  Laterality: Right;   [place] Right 12/24/2018   Procedure: FIRST STAGE BASILIC VEIN TRANSPOSITION RIGHT UPPER ARM;  Surgeon: Serafina Mitchell, MD;  Location: [place];  Service: Vascular;  Laterality:  Right;   FISTULA SUPERFICIALIZATION Right 11/03/2014   Procedure: RIGHT UPPER ARM FISTULA SUPERFICIALIZATION;  Surgeon: Mal Misty, MD;  Location: [place];  Service: Vascular;  Laterality: Right;   FISTULOGRAM Right 01/16/2016   Procedure: RIGHT UPPER EXTREMITY ARTERIOVENOUS FISTULOGRAM WITH BALLOON ANGIOPLASTY;  Surgeon: Vickie Epley, MD;  Location: [facility];  Service: Vascular;  Laterality: Right;   INSERTION OF DIALYSIS CATHETER     X4   IR FLUORO GUIDE CV LINE RIGHT  12/03/2018   IR REMOVAL TUN CV CATH W/O FL  07/06/2019   IR THROMBECTOMY AV FISTULA W/THROMBOLYSIS/PTA INC/SHUNT/IMG RIGHT Right 12/03/2018   IR THROMBECTOMY AV FISTULA W/THROMBOLYSIS/PTA/STENT INC/SHUNT/IMG RT Right 10/11/2019   IR US GUIDE VASC ACCESS RIGHT  12/03/2018   IR US GUIDE VASC ACCESS RIGHT  12/03/2018   IR US GUIDE VASC ACCESS RIGHT  10/14/2019   LIGATION OF COMPETING BRANCHES OF ARTERIOVENOUS FISTULA Right 11/03/2014   Procedure: LIGATION OF COMPETING BRANCHE x1 OF RIGHT UPPER ARM ARTERIOVENOUS FISTULA;  Surgeon: Mal Misty, MD;  Location: [place];  Service: Vascular;  Laterality: Right;   PERIPHERAL VASCULAR BALLOON ANGIOPLASTY Right 02/04/2017   Procedure: PERIPHERAL VASCULAR BALLOON ANGIOPLASTY;  Surgeon: Waynetta Sandy, MD;  Location: [place] [facility];  Service: Cardiovascular;  Laterality: Right;   PERIPHERAL VASCULAR BALLOON ANGIOPLASTY Right 11/18/2019   Procedure: PERIPHERAL VASCULAR BALLOON ANGIOPLASTY;  Surgeon: [name], MD;  Location: [place] [facility];  Service: Cardiovascular;  Laterality: Right;  anterior tibial   PERIPHERAL VASCULAR BALLOON ANGIOPLASTY  02/23/2020   Procedure: PERIPHERAL VASCULAR BALLOON ANGIOPLASTY;  Surgeon: [name], MD;  Location: [place] [facility];  Service: Cardiovascular;;  Lt  AT   PERIPHERAL VASCULAR INTERVENTION Right 08/10/2020   Procedure: PERIPHERAL VASCULAR INTERVENTION;  Surgeon: [name], MD;  Location: [place] [facility];   Service: Cardiovascular;  Laterality: Right;   TRANSMETATARSAL AMPUTATION Left 02/24/2020   Procedure: TRANSMETATARSAL AMPUTATION LEFT;  Surgeon: Cherre Robins, MD;  Location: [facility];  Service: Vascular;  Laterality: Left;   TRANSMETATARSAL AMPUTATION Right 08/11/2020   Procedure: RIGHT TRANSMETATARSAL AMPUTATION;  Surgeon: Waynetta Sandy, MD;  Location: [place];  Service: Vascular;  Laterality: Right;   TUBAL LIGATION      Family History  Problem Relation Age of Onset   Diabetes Mother    Hypertension Mother    Cancer Father    Diabetes Father    Hypertension Sister    Diabetes Daughter    Heart disease Daughter    Hypertension Daughter    Diabetes Son    Heart disease Son    Hypertension Son    Peripheral vascular disease Son        amputation    Social History:  reports that she quit smoking about 36 years ago. Her smoking use included cigarettes. She has never used smokeless tobacco. She reports that she does not drink alcohol and does not use drugs.  Allergies:  Allergies  Allergen Reactions   Sulfa Antibiotics Swelling    Medications: I have reviewed the patient's current medications.   Results for orders placed or performed  during the hospital encounter of 10/27/20 (from the past 48 hour(s))  CBG monitoring, ED     Status: Abnormal   Collection Time: 10/27/20  6:33 PM  Result Value Ref Range   Glucose-Capillary 241 (H) 70 - 99 mg/dL    Comment: Glucose reference range applies only to samples taken after fasting for at least 8 hours.  Comprehensive metabolic panel     Status: Abnormal   Collection Time: 10/27/20  6:51 PM  Result Value Ref Range   Sodium 130 (L) 135 - 145 mmol/L   Potassium 4.0 3.5 - 5.1 mmol/L   Chloride 90 (L) 98 - 111 mmol/L   CO2 25 22 - 32 mmol/L   Glucose, Bld 230 (H) 70 - 99 mg/dL    Comment: Glucose reference range applies only to samples taken after fasting for at least 8 hours.   BUN 25 (H) 8 - 23 mg/dL   Creatinine, Ser  6.44 (H) 0.44 - 1.00 mg/dL   Calcium 8.3 (L) 8.9 - 10.3 mg/dL   Total Protein 7.6 6.5 - 8.1 g/dL   Albumin 2.8 (L) 3.5 - 5.0 g/dL   AST 30 15 - 41 U/L   ALT 18 0 - 44 U/L   Alkaline Phosphatase 86 38 - 126 U/L   Total Bilirubin 0.5 0.3 - 1.2 mg/dL   GFR, Estimated 7 (L) >60 mL/min    Comment: (NOTE) Calculated using the CKD-EPI Creatinine Equation (2021)    Anion gap 15 5 - 15    Comment: Performed at [facility], [address], [place], [place] [address]  CBC WITH DIFFERENTIAL     Status: Abnormal   Collection Time: 10/27/20  6:51 PM  Result Value Ref Range   WBC 18.7 (H) 4.0 - 10.5 K/uL   RBC 3.91 3.87 - 5.11 MIL/uL   Hemoglobin 9.5 (L) 12.0 - 15.0 g/dL   HCT 32.0 (L) 36.0 - 46.0 %   MCV 81.8 80.0 - 100.0 fL   MCH 24.3 (L) 26.0 - 34.0 pg   MCHC 29.7 (L) 30.0 - 36.0 g/dL   RDW 18.9 (H) 11.5 - 15.5 %   Platelets 736 (H) 150 - 400 K/uL   nRBC 1.2 (H) 0.0 - 0.2 %   Neutrophils Relative % 85 %   Neutro Abs 15.7 (H) 1.7 - 7.7 K/uL   Lymphocytes Relative 9 %   Lymphs Abs 1.6 0.7 - 4.0 K/uL   Monocytes Relative 4 %   Monocytes Absolute 0.8 0.1 - 1.0 K/uL   Eosinophils Relative 0 %   Eosinophils Absolute 0.1 0.0 - 0.5 K/uL   Basophils Relative 0 %   Basophils Absolute 0.1 0.0 - 0.1 K/uL   Immature Granulocytes 2 %   Abs Immature Granulocytes 0.37 (H) 0.00 - 0.07 K/uL    Comment: Performed at [facility], [address] [place], [place] [address]  Protime-INR     Status: Abnormal   Collection Time: 10/27/20  6:51 PM  Result Value Ref Range   Prothrombin Time 18.3 (H) 11.4 - 15.2 seconds   INR 1.5 (H) 0.8 - 1.2    Comment: (NOTE) INR goal varies based on device and disease states. Performed at [facility], [address] [place], [place] [address]   APTT     Status: None   Collection Time: 10/27/20  6:51 PM  Result Value Ref Range   aPTT 31 24 - 36 seconds    Comment: Performed at [facility], [address] [place],  [place] [address]  Lactic acid, plasma     Status:  Abnormal   Collection Time: 10/27/20  7:27 PM  Result Value Ref Range   Lactic Acid, Venous 2.9 (HH) 0.5 - 1.9 mmol/L    Comment: CRITICAL RESULT CALLED TO, READ BACK BY AND VERIFIED WITH: NICHOLS,T 2015 10/27/2020 COLEMAN,R Performed at [facility], [address], [place], [place] [address]   Blood Culture (routine x 2)     Status: None (Preliminary result)   Collection Time: 10/27/20  7:27 PM   Specimen: BLOOD LEFT HAND  Result Value Ref Range   Specimen Description BLOOD LEFT HAND    Special Requests      BOTTLES DRAWN AEROBIC ONLY Blood Culture adequate volume   Culture      NO GROWTH < 12 HOURS Performed at [facility], [address] [place], [place] [address]    Report Status PENDING   Lactic acid, plasma     Status: Abnormal   Collection Time: 10/27/20  9:50 PM  Result Value Ref Range   Lactic Acid, Venous 2.0 (HH) 0.5 - 1.9 mmol/L    Comment: CRITICAL RESULT CALLED TO, READ BACK BY AND VERIFIED WITH: NICHOLS,K 2241 10/27/2020 COLEMAN,R Performed at [facility], [address] [place], [place] [address]   Troponin I (High Sensitivity)     Status: Abnormal   Collection Time: 10/27/20 10:23 PM  Result Value Ref Range   Troponin I (High Sensitivity) 79 (H) <18 ng/L    Comment: (NOTE) Elevated high sensitivity troponin I (hsTnI) values and significant  changes across serial measurements may suggest ACS but many other  chronic and acute conditions are known to elevate hsTnI results.  Refer to the "Links" section for chest pain algorithms and additional  guidance. Performed at [facility], [address], [place], [place] [address]   Troponin I (High Sensitivity)     Status: Abnormal   Collection Time: 10/28/20  1:30 AM  Result Value Ref Range   Troponin I (High Sensitivity) 82 (H) <18 ng/L    Comment: (NOTE) Elevated high sensitivity troponin I (hsTnI) values and significant  changes across serial measurements may suggest ACS but many other  chronic and acute  conditions are known to elevate hsTnI results.  Refer to the "Links" section for chest pain algorithms and additional  guidance. Performed at [facility], [address] [place], [place] [address]   Lactic acid, plasma     Status: Abnormal   Collection Time: 10/28/20  1:33 AM  Result Value Ref Range   Lactic Acid, Venous 2.0 (HH) 0.5 - 1.9 mmol/L    Comment: CRITICAL RESULT CALLED TO, READ BACK BY AND VERIFIED WITH: GESELL,C @ 0207 ON 10/28/20 BY JUW Performed at [facility], [address] [place], [place] [address]   Comprehensive metabolic panel     Status: Abnormal   Collection Time: 10/28/20  3:58 AM  Result Value Ref Range   Sodium 131 (L) 135 - 145 mmol/L   Potassium 3.9 3.5 - 5.1 mmol/L   Chloride 95 (L) 98 - 111 mmol/L   CO2 25 22 - 32 mmol/L   Glucose, Bld 207 (H) 70 - 99 mg/dL    Comment: Glucose reference range applies only to samples taken after fasting for at least 8 hours.   BUN 25 (H) 8 - 23 mg/dL   Creatinine, Ser 6.31 (H) 0.44 - 1.00 mg/dL   Calcium 7.6 (L) 8.9 - 10.3 mg/dL   Total Protein 6.7 6.5 -  8.1 g/dL   Albumin 2.5 (L) 3.5 - 5.0 g/dL   AST 37 15 - 41 U/L   ALT 23 0 - 44 U/L   Alkaline Phosphatase 63 38 - 126 U/L   Total Bilirubin 0.5 0.3 - 1.2 mg/dL   GFR, Estimated 7 (L) >60 mL/min    Comment: (NOTE) Calculated using the CKD-EPI Creatinine Equation (2021)    Anion gap 11 5 - 15    Comment: Performed at [facility], [address], [place], [place] [address]  CBC     Status: Abnormal   Collection Time: 10/28/20  3:58 AM  Result Value Ref Range   WBC 17.5 (H) 4.0 - 10.5 K/uL   RBC 3.57 (L) 3.87 - 5.11 MIL/uL   Hemoglobin 8.8 (L) 12.0 - 15.0 g/dL   HCT 29.4 (L) 36.0 - 46.0 %   MCV 82.4 80.0 - 100.0 fL   MCH 24.6 (L) 26.0 - 34.0 pg   MCHC 29.9 (L) 30.0 - 36.0 g/dL   RDW 18.7 (H) 11.5 - 15.5 %   Platelets 649 (H) 150 - 400 K/uL   nRBC 1.0 (H) 0.0 - 0.2 %    Comment: Performed at [facility], [address] [place], [place] [address]   Protime-INR     Status: Abnormal   Collection Time: 10/28/20  3:58 AM  Result Value Ref Range   Prothrombin Time 17.9 (H) 11.4 - 15.2 seconds   INR 1.5 (H) 0.8 - 1.2    Comment: (NOTE) INR goal varies based on device and disease states. Performed at [facility], [address], [place], [place] [address]   APTT     Status: None   Collection Time: 10/28/20  3:58 AM  Result Value Ref Range   aPTT 33 24 - 36 seconds    Comment: Performed at [facility], [address] [place], [place] [address]  Magnesium     Status: Abnormal   Collection Time: 10/28/20  3:58 AM  Result Value Ref Range   Magnesium 1.6 (L) 1.7 - 2.4 mg/dL    Comment: Performed at [facility], [address] [place], [place] [address]  Phosphorus     Status: Abnormal   Collection Time: 10/28/20  3:58 AM  Result Value Ref Range   Phosphorus 5.7 (H) 2.5 - 4.6 mg/dL    Comment: Performed at [facility], [address] [place], [place] [address]  Procalcitonin - Baseline     Status: None   Collection Time: 10/28/20  3:58 AM  Result Value Ref Range   Procalcitonin 4.20 ng/mL    Comment:        Interpretation: PCT > 2 ng/mL: Systemic infection (sepsis) is likely, unless other causes are known. (NOTE)       Sepsis PCT Algorithm           Lower Respiratory Tract                                      Infection PCT Algorithm    ----------------------------     ----------------------------         PCT < 0.25 ng/mL                PCT < 0.10 ng/mL          Strongly encourage             Strongly discourage   discontinuation of  antibiotics    initiation of antibiotics    ----------------------------     -----------------------------       PCT 0.25 - 0.50 ng/mL            PCT 0.10 - 0.25 ng/mL               OR       >80% decrease in PCT            Discourage initiation of                                            antibiotics      Encourage discontinuation           of antibiotics    ----------------------------      -----------------------------         PCT >= 0.50 ng/mL              PCT 0.26 - 0.50 ng/mL               AND       <80% decrease in PCT              Encourage initiation of                                             antibiotics       Encourage continuation           of antibiotics    ----------------------------     -----------------------------        PCT >= 0.50 ng/mL                  PCT > 0.50 ng/mL               AND         increase in PCT                  Strongly encourage                                      initiation of antibiotics    Strongly encourage escalation           of antibiotics                                     -----------------------------                                           PCT <= 0.25 ng/mL                                                 OR                                        >  80% decrease in PCT                                      Discontinue / Do not initiate                                             antibiotics  Performed at [facility], [address], [place], [place] [address]   Lactic acid, plasma     Status: None   Collection Time: 10/28/20  3:58 AM  Result Value Ref Range   Lactic Acid, Venous 1.3 0.5 - 1.9 mmol/L    Comment: Performed at [facility], [address] [place], [place] [address]  Troponin I (High Sensitivity)     Status: Abnormal   Collection Time: 10/28/20  3:58 AM  Result Value Ref Range   Troponin I (High Sensitivity) 109 (HH) <18 ng/L    Comment: DELTA CHECK NOTED CRITICAL RESULT CALLED TO, READ BACK BY AND VERIFIED WITH: TENSLEY,B @ 0451 ON 10/28/20 BY JUW (NOTE) Elevated high sensitivity troponin I (hsTnI) values and significant  changes across serial measurements may suggest ACS but many other  chronic and acute conditions are known to elevate hsTnI results.  Refer to the Links section for chest pain algorithms and additional  guidance. Performed at [facility], [address] [place], [place] [address]    Resp Panel by RT-PCR (Flu A&B, Covid) Nasopharyngeal Swab     Status: None   Collection Time: 10/28/20  5:05 AM   Specimen: Nasopharyngeal Swab; Nasopharyngeal(NP) swabs in vial transport medium  Result Value Ref Range   SARS Coronavirus 2 by RT PCR NEGATIVE NEGATIVE    Comment: (NOTE) SARS-CoV-2 target nucleic acids are NOT DETECTED.  The SARS-CoV-2 RNA is generally detectable in upper respiratory specimens during the acute phase of infection. The lowest concentration of SARS-CoV-2 viral copies this assay can detect is 138 copies/mL. A negative result does not preclude SARS-Cov-2 infection and should not be used as the sole basis for treatment or other patient management decisions. A negative result may occur with  improper specimen collection/handling, submission of specimen other than nasopharyngeal swab, presence of viral mutation(s) within the areas targeted by this assay, and inadequate number of viral copies(<138 copies/mL). A negative result must be combined with clinical observations, patient history, and epidemiological information. The expected result is Negative.  Fact Sheet for Patients:  EntrepreneurPulse.com.au  Fact Sheet for Healthcare Providers:  IncredibleEmployment.be  This test is no t yet approved or cleared by the [place] FDA and  has been authorized for detection and/or diagnosis of SARS-CoV-2 by FDA under an Emergency Use Authorization (EUA). This EUA will remain  in effect (meaning this test can be used) for the duration of the COVID-19 declaration under Section 564(b)(1) of the Act, 21 U.S.C.section 360bbb-3(b)(1), unless the authorization is terminated  or revoked sooner.       Influenza A by PCR NEGATIVE NEGATIVE   Influenza B by PCR NEGATIVE NEGATIVE    Comment: (NOTE) The Xpert Xpress SARS-CoV-2/FLU/RSV plus assay is intended as an aid in the diagnosis of influenza from Nasopharyngeal swab specimens  and should not be used as a sole basis for treatment. Nasal washings and aspirates are unacceptable for Xpert Xpress SARS-CoV-2/FLU/RSV testing.  Fact Sheet for Patients:  EntrepreneurPulse.com.au  Fact Sheet for Healthcare Providers: IncredibleEmployment.be  This test is not yet approved or cleared by the [place] FDA and has been authorized for detection and/or diagnosis of SARS-CoV-2 by FDA under an Emergency Use Authorization (EUA). This EUA will remain in effect (meaning this test can be used) for the duration of the COVID-19 declaration under Section 564(b)(1) of the Act, 21 U.S.C. section 360bbb-3(b)(1), unless the authorization is terminated or revoked.  Performed at [facility], [address], [place], [place] [address]   Troponin I (High Sensitivity)     Status: Abnormal   Collection Time: 10/28/20  5:52 AM  Result Value Ref Range   Troponin I (High Sensitivity) 107 (HH) <18 ng/L    Comment: CRITICAL RESULT CALLED TO, READ BACK BY AND VERIFIED WITH: DAIL T @ 0651 ON [id number] BY HENDERSON L. (NOTE) Elevated high sensitivity troponin I (hsTnI) values and significant  changes across serial measurements may suggest ACS but many other  chronic and acute conditions are known to elevate hsTnI results.  Refer to the Links section for chest pain algorithms and additional  guidance. Performed at [facility], [address] [place], [place] [address]   CBG monitoring, ED     Status: Abnormal   Collection Time: 10/28/20  8:56 AM  Result Value Ref Range   Glucose-Capillary 162 (H) 70 - 99 mg/dL    Comment: Glucose reference range applies only to samples taken after fasting for at least 8 hours.    DG Chest Port 1 View  Result Date: 10/27/2020 CLINICAL DATA:  Confused after dialysis, history of asthma EXAM: PORTABLE CHEST 1 VIEW COMPARISON:  06/22/2020 FINDINGS: Dense retrocardiac and left mid lung opacity partially silhouetting portion of  the left heart border. Could reflect a combination of volume loss and airspace consolidation in the lingula. Remaining portions the lungs are otherwise clear aside from basilar atelectasis. Visible portions of the cardiac silhouette suggests some mild cardiomegaly accounting for portable technique. The aorta is calcified. The remaining cardiomediastinal contours are unremarkable. Vascular stenting noted in the right subclavian to axillary vessels. Telemetry leads overlie the chest. Degenerative changes are present in the imaged spine and shoulders. IMPRESSION: Dense retrocardiac and left mid lung opacity partially silhouetting the left heart border. Could reflect acute consolidation or pneumonia with volume loss in the lingula. Cardiomegaly Aortic Atherosclerosis (ICD10-I70.0). Electronically Signed   By: Lovena Le M.D.   On: 10/27/2020 19:46    ROS: Limited because of the confusion.  Rest as per H&P. Blood pressure 97/64, pulse 78, temperature 98.6 F (37 C), temperature source Oral, resp. rate 14, height '5\' 3"'$  (1.6 m), weight 94 kg, SpO2 98 %. Gen: NAD, comfortable Respiratory: Bilateral coarse breath sound, no wheezing Cardiovascular: Regular rate rhythm S1-S2 normal, no rubs GI: Abdomen soft, nontender, nondistended Extremities, bilateral BKA, no edema noted. Skin: No rash or ulcer Neurology: Alert, awake but not oriented and falling back to sleep easily. Dialysis Access: Right AV graft has thrill and bruit present.  Assessment/Plan:  #Sepsis due to pneumonia: Currently on IV vancomycin and cefepime.  Follow-up culture results.  #Acute hypoxic respiratory failure due to pneumonia: No sign of fluid overload.  Continue oxygen, antibiotics and supportive care as primary team.  # ESRD: MWF at [facility].  Reportedly she had 3 hours of dialysis yesterday.  No need for dialysis today and we will plan for HD on 7/4.  She has AV graft for the access.  Avoid IV fluid.  # Hypertension:  Blood  pressure improved except.  Hypotensive episode.  Not on antihypertensive.  Continue to monitor closely.    #Acute metabolic encephalopathy due to sepsis: Management as above.  Monitor mental status.  #Hyponatremia, hypervolemic: Avoid IV fluid.  Ultrafiltration during dialysis.  # Anemia of ESRD: Hemoglobin below goal.  ESA during HD.  # Metabolic Bone Disease: Corrected calcium acceptable.  Continue sevelamer.  Monitor electrolytes.  Thank you for the consult.  We will review her chart over the weekend.  Please call with question. Discussed with the primary team.  Rosita Fire 10/28/2020, 12:56 PM

## 2020-10-28 NOTE — Progress Notes (Signed)
RT to bedside for rapid response due to desaturation. Pt currently on 100% NRB. ABG collected per protocol and sent to lab. RT will continue to monitor.

## 2020-10-28 NOTE — Progress Notes (Signed)
Patient seen and examined. She is alert, denies worsening dyspnea, denies chest pain.  Cardiology has been consulted.  Continue with IV antibiotics.  ESRD; nephrology will continue to follow patient here. Acute metabolic encephalopathy/. Patient is alert, conversant, oriented to place.   Niel Hummer, MD

## 2020-10-28 NOTE — ED Notes (Signed)
Date and time results received: 10/28/20 0451 (use smartphrase ".now" to insert current time)  Test: troponin Critical Value: 109  Name of Provider Notified: O. Josephine Cables, MD

## 2020-10-28 NOTE — ED Notes (Signed)
Attempted to wean pt off o2; reports she does not wear o2 at home. Was on 5L/[place], turned o2 off, sats dropped into mid-upper 70s. Increased O2 to 3L, maintaining sats in the 90s.

## 2020-10-28 NOTE — Progress Notes (Signed)
Pharmacy Antibiotic Note  TAIMI PINER is a 65 y.o. female admitted on 10/27/2020 with  unknown source .  Pharmacy has been consulted for vancomycin/cefepime dosing. Of note, patient is ESRD on HD. Usual schedule is MWF, and she did go to HD today, but unsure if full session was completed or not. Given 1000 mg vancomycin dose, will repeat for a total load of 2000 mg.  Plan: Vancomycin 2000 mg IV x 1.  Follow up HD plans/schedule and tolerance for additional doses Cefepime 1 gm IV every 24 hours Monitor clinical progress, cultures/sensitivities, abx plan.Vancomycin levels as indicated  Vancomycin '1000mg'$  to be given during last hour of HD MWF   Height: '5\' 3"'$  (160 cm) Weight: 94 kg (207 lb 3.7 oz) IBW/kg (Calculated) : 52.4  Temp (24hrs), Avg:98.3 F (36.8 C), Min:98 F (36.7 C), Max:98.6 F (37 C)  Recent Labs  Lab 10/27/20 1851 10/27/20 1927 10/27/20 2150 10/28/20 0133 10/28/20 0358  WBC 18.7*  --   --   --  17.5*  CREATININE 6.44*  --   --   --  6.31*  LATICACIDVEN  --  2.9* 2.0* 2.0* 1.3     Estimated Creatinine Clearance: 9.9 mL/min (A) (by C-G formula based on SCr of 6.31 mg/dL (H)).    Allergies  Allergen Reactions   Sulfa Antibiotics Swelling     Thank you for allowing pharmacy to be a part of this patient's care.  Donna Christen Paraskevi Funez, PharmD 10/28/2020 1:11 PM

## 2020-10-28 NOTE — Consult Note (Signed)
CARDIOLOGY CONSULT NOTE  Patient ID: Christy Zhang MRN: [id number] DOB/AGE: 07-17-1955 65 y.o.  Admit date: 10/27/2020 Primary Physician Claretta Fraise, MD Primary Cardiologist None Chief Complaint   altered mental status Requesting  Dr. Tobe Sos  HPI:   The patient has a history of end-stage renal disease and diabetes mellitus.  She is dialysis dependent.  She has had peripheral vascular disease with bilateral amputations.  She has not had prior cardiac history.  There is a mention of congestive heart failure.  However, she does not know any details of this and really does not report that she has had any problems from a cardiac standpoint she has been complaining of cough and some shortness of breath.  She said that this was probably happening over couple of days.  She was a little short of breath.  She did not notice any tachypalpitations.  She was not having any chest pressure.  She might get some mild discomfort at night sporadically but this is been going on for years.  She has to be lifted into her chair and she cannot wheel herself a little bit but she is otherwise sedentary.  She is never had any presyncope or syncope.   The patient was lethargic at dialysis which she says is unusual for her.  She went to the [facility] where she again was lethargic when she got her daughter called EMS.  She was hypoxic and required oxygen.  She was hypotensive.  Chest x-ray was consistent with left lung pneumonia.  She is being treated with IV antibiotics.  She was initially on 5 L O2.  In the ED she did have a mildly elevated troponin.  However, this was fairly nondiagnostic with nearly flat trend.  She was last evening on presentation in atrial flutter with 2:1 conduction and a rapid ventricular rate.  Follow-up EKG was sinus rhythm with premature atrial contractions.  Subsequently today the EKG demonstrates atrial fibrillation.  In in retrospect she says she might have some palpitations at home and she was  told that her heart rate was elevated at dialysis blood nobody is ever told her of an arrhythmia before.  Past Medical History:  Diagnosis Date   Anemia    Arthritis    Asthma    in the past   CHF (congestive heart failure) ([place])    Chronic kidney disease    Renal failure, dialysis on M/W/F   Diabetes mellitus with complication (HCC)    Type 2   End stage renal disease (HCC)    End stage renal disease on dialysis ([place])    Gangrene ([place])    Hemodialysis access, arteriovenous graft ([place])    Hyperglycemia due to diabetes mellitus ([place]) 10/15/2019   Hyperlipidemia    Hypertension    Neuromuscular disorder ([place])     Past Surgical History:  Procedure Laterality Date    LEFT BELOW KNEE AMPUTATION (Left Knee)  06/08/2020   A/V FISTULAGRAM Right 02/04/2017   Procedure: A/V Fistulagram;  Surgeon: Waynetta Sandy, MD;  Location: [facility];  Service: Cardiovascular;  Laterality: Right;   ABDOMINAL AORTOGRAM W/LOWER EXTREMITY N/A 11/18/2019   Procedure: ABDOMINAL AORTOGRAM W/LOWER EXTREMITY;  Surgeon: [name], MD;  Location: [facility];  Service: Cardiovascular;  Laterality: N/A;   ABDOMINAL AORTOGRAM W/LOWER EXTREMITY N/A 02/23/2020   Procedure: ABDOMINAL AORTOGRAM W/LOWER EXTREMITY;  Surgeon: [name], MD;  Location: [facility];  Service: Cardiovascular;  Laterality: N/A;  Bilateral  ABDOMINAL AORTOGRAM W/LOWER EXTREMITY N/A 08/10/2020   Procedure: ABDOMINAL AORTOGRAM W/LOWER EXTREMITY;  Surgeon: [name], MD;  Location: [facility];  Service: Cardiovascular;  Laterality: N/A;   AMPUTATION Left 06/08/2020   Procedure: LEFT BELOW KNEE AMPUTATION;  Surgeon: Cherre Robins, MD;  Location: [facility];  Service: Vascular;  Laterality: Left;   AMPUTATION Right 09/14/2020   Procedure: RIGHT BELOW KNEE AMPUTATION;  Surgeon: Waynetta Sandy, MD;  Location: [facility];  Service: Vascular;  Laterality: Right;   AV FISTULA  PLACEMENT Right 08/25/2014   Procedure: RIGHT BRACHIOCEPHALIC ARTERIOVENOUS (AV) FISTULA CREATION;  Surgeon: Mal Misty, MD;  Location: [facility];  Service: Vascular;  Laterality: Right;   AV FISTULA PLACEMENT Right 03/18/2019   Procedure: INSERTION OF ARTERIOVENOUS (AV) GORE-TEX GRAFT RIGHT ARM;  Surgeon: Serafina Mitchell, MD;  Location: [facility];  Service: Vascular;  Laterality: Right;   [place] Right 12/24/2018   Procedure: FIRST STAGE BASILIC VEIN TRANSPOSITION RIGHT UPPER ARM;  Surgeon: Serafina Mitchell, MD;  Location: [facility];  Service: Vascular;  Laterality: Right;   FISTULA SUPERFICIALIZATION Right 11/03/2014   Procedure: RIGHT UPPER ARM FISTULA SUPERFICIALIZATION;  Surgeon: Mal Misty, MD;  Location: [facility];  Service: Vascular;  Laterality: Right;   FISTULOGRAM Right 01/16/2016   Procedure: RIGHT UPPER EXTREMITY ARTERIOVENOUS FISTULOGRAM WITH BALLOON ANGIOPLASTY;  Surgeon: Vickie Epley, MD;  Location: [facility];  Service: Vascular;  Laterality: Right;   INSERTION OF DIALYSIS CATHETER     X4   IR FLUORO GUIDE CV LINE RIGHT  12/03/2018   IR REMOVAL TUN CV CATH W/O FL  07/06/2019   IR THROMBECTOMY AV FISTULA W/THROMBOLYSIS/PTA INC/SHUNT/IMG RIGHT Right 12/03/2018   IR THROMBECTOMY AV FISTULA W/THROMBOLYSIS/PTA/STENT INC/SHUNT/IMG RT Right 10/11/2019   IR US GUIDE VASC ACCESS RIGHT  12/03/2018   IR US GUIDE VASC ACCESS RIGHT  12/03/2018   IR US GUIDE VASC ACCESS RIGHT  10/14/2019   LIGATION OF COMPETING BRANCHES OF ARTERIOVENOUS FISTULA Right 11/03/2014   Procedure: LIGATION OF COMPETING BRANCHE x1 OF RIGHT UPPER ARM ARTERIOVENOUS FISTULA;  Surgeon: Mal Misty, MD;  Location: [facility];  Service: Vascular;  Laterality: Right;   PERIPHERAL VASCULAR BALLOON ANGIOPLASTY Right 02/04/2017   Procedure: PERIPHERAL VASCULAR BALLOON ANGIOPLASTY;  Surgeon: Waynetta Sandy, MD;  Location: [facility];  Service: Cardiovascular;  Laterality: Right;   PERIPHERAL VASCULAR BALLOON  ANGIOPLASTY Right 11/18/2019   Procedure: PERIPHERAL VASCULAR BALLOON ANGIOPLASTY;  Surgeon: [name], MD;  Location: [facility];  Service: Cardiovascular;  Laterality: Right;  anterior tibial   PERIPHERAL VASCULAR BALLOON ANGIOPLASTY  02/23/2020   Procedure: PERIPHERAL VASCULAR BALLOON ANGIOPLASTY;  Surgeon: [name], MD;  Location: [facility];  Service: Cardiovascular;;  Lt  AT   PERIPHERAL VASCULAR INTERVENTION Right 08/10/2020   Procedure: PERIPHERAL VASCULAR INTERVENTION;  Surgeon: [name], MD;  Location: [facility];  Service: Cardiovascular;  Laterality: Right;   TRANSMETATARSAL AMPUTATION Left 02/24/2020   Procedure: TRANSMETATARSAL AMPUTATION LEFT;  Surgeon: Cherre Robins, MD;  Location: [facility];  Service: Vascular;  Laterality: Left;   TRANSMETATARSAL AMPUTATION Right 08/11/2020   Procedure: RIGHT TRANSMETATARSAL AMPUTATION;  Surgeon: Waynetta Sandy, MD;  Location: [facility];  Service: Vascular;  Laterality: Right;   TUBAL LIGATION      Allergies  Allergen Reactions   Sulfa Antibiotics Swelling   Medications Prior to Admission  Medication Sig Dispense Refill Last Dose   acetaminophen (  TYLENOL) 325 MG tablet Take 1-2 tablets (325-650 mg total) by mouth every 4 (four) hours as needed for mild pain.      amLODipine (NORVASC) 5 MG tablet TAKE ONE TABLET BY MOUTH AT BEDTIME (Patient taking differently: Take 5 mg by mouth daily.) 30 tablet 3 10/26/2020   aspirin EC 81 MG tablet Take 81 mg by mouth at bedtime. Swallow whole.   10/26/2020   atorvastatin (LIPITOR) 40 MG tablet TAKE ONE TABLET BY MOUTH DAILY (Patient taking differently: at bedtime.) 90 tablet 0 10/26/2020   clopidogrel (PLAVIX) 75 MG tablet Take 1 tablet (75 mg total) by mouth daily. (Patient taking differently: Take 75 mg by mouth every evening.) 30 tablet 11 10/26/2020 at 2030   diphenhydrAMINE (BENADRYL) 25 MG tablet Take 25 mg by mouth every 6 (six) hours as  needed for itching.      gabapentin (NEURONTIN) 300 MG capsule Take 1 capsule (300 mg total) by mouth at bedtime. 30 capsule 1 10/26/2020   insulin detemir (LEVEMIR) 100 UNIT/ML injection Inject 0.16 mLs (16 Units total) into the skin daily. 5 mL 11 10/26/2020   metoprolol succinate (TOPROL-XL) 50 MG 24 hr tablet TAKE ONE TABLET BY MOUTH DAILY. TAKE WITH OR IMMEDIATELY FOLLOWING A MEAL (Patient taking differently: Take 50 mg by mouth at bedtime.) 30 tablet 3 10/26/2020 at 2030   oxyCODONE (OXY IR/ROXICODONE) 5 MG immediate release tablet Take 1 tablet (5 mg total) by mouth every 6 (six) hours as needed for severe pain. 10 tablet 0 Past Month   senna-docusate (SENOKOT-S) 8.6-50 MG tablet Take 1 tablet by mouth 2 (two) times daily. (Patient taking differently: Take 1 tablet by mouth 2 (two) times daily as needed for mild constipation.) 60 tablet 0    sevelamer carbonate (RENVELA) 800 MG tablet Take 1,600-2,400 mg by mouth See admin instructions. Take 2400 mg with each meal and 1600 mg with each snack   123456   TRULICITY 1.5 0000000 SOPN INJECT CONTENTs OF ONE PEN UNDER THE SKIN WEEKLY ON SATURDAY (Patient taking differently: Inject 1.5 mg into the skin every Saturday.) 2 mL 0 10/25/2020   Insulin Syringe-Needle U-100 28G X 1/2" 1 ML MISC Use with insulin daily Dx E11.22 100 each 3    multivitamin (RENA-VIT) TABS tablet Take 1 tablet by mouth at bedtime.  (Patient not taking: No sig reported)   Not Taking   polyethylene glycol (MIRALAX / GLYCOLAX) 17 g packet Take 17 g by mouth daily. (Patient not taking: No sig reported) 18 each 0 Not Taking   Family History  Problem Relation Age of Onset   Diabetes Mother    Hypertension Mother    Cancer Father    Diabetes Father    Hypertension Sister    Diabetes Daughter    Heart disease Daughter    Hypertension Daughter    Diabetes Son    Heart disease Son    Hypertension Son    Peripheral vascular disease Son        amputation    Social History    Socioeconomic History   Marital status: Widowed    Spouse name: Not on file   Number of children: Not on file   Years of education: Not on file   Highest education level: Not on file  Occupational History   Not on file  Tobacco Use   Smoking status: Former    Pack years: 0.00    Types: Cigarettes    Quit date: 07/25/1984    Years since  quitting: 36.2   Smokeless tobacco: Never  Vaping Use   Vaping Use: Never used  Substance and Sexual Activity   Alcohol use: No    Alcohol/week: 0.0 standard drinks   Drug use: No   Sexual activity: Not on file  Other Topics Concern   Not on file  Social History Narrative   Not on file   Social Determinants of Health   Financial Resource Strain: Low Risk    Difficulty of Paying Living Expenses: Not very hard  Food Insecurity: No Food Insecurity   Worried About Running Out of Food in the Last Year: Never true   Ran Out of Food in the Last Year: Never true  Transportation Needs: No Transportation Needs   Lack of Transportation (Medical): No   Lack of Transportation (Non-Medical): No  Physical Activity: Inactive   Days of Exercise per Week: 0 days   Minutes of Exercise per Session: 0 min  Stress: No Stress Concern Present   Feeling of Stress : Only a little  Social Connections: Socially Isolated   Frequency of Communication with Friends and Family: More than three times a week   Frequency of Social Gatherings with Friends and Family: More than three times a week   Attends Religious Services: Never   Marine scientist or Organizations: No   Attends Archivist Meetings: Never   Marital Status: Widowed  Intimate Partner Violence: Not on file     ROS:    As stated in the HPI and negative for all other systems.  Physical Exam: Blood pressure 123/73, pulse 90, temperature 98 F (36.7 C), temperature source Oral, resp. rate 19, height '5\' 4"'$  (1.626 m), weight 89.7 kg, SpO2 94 %.  GENERAL:  Well appearing HEENT:  Pupils  equal round and reactive, fundi not visualized, oral mucosa unremarkable NECK:  No jugular venous distention, waveform within normal limits, carotid upstroke brisk and symmetric, no bruits, no thyromegaly LYMPHATICS:  No cervical, inguinal adenopathy LUNGS:  Clear to auscultation bilaterally BACK:  No CVA tenderness CHEST:  Unremarkable HEART:  PMI not displaced or sustained,S1 and S2 within normal limits, no S3, no S4, no clicks, no rubs, no murmurs ABD:  Flat, positive bowel sounds normal in frequency in pitch, no bruits, no rebound, no guarding, no midline pulsatile mass, no hepatomegaly, no splenomegaly EXT:  2 plus pulses throughout, no edema, no cyanosis no clubbing, right arm dialysis fistula status post bilateral BKA's SKIN:  No rashes no nodules NEURO:  Cranial nerves II through XII grossly intact, motor grossly intact throughout PSYCH:  Cognitively intact, oriented to person place and time   Labs: Lab Results  Component Value Date   BUN 25 (H) 10/28/2020   Lab Results  Component Value Date   CREATININE 6.31 (H) 10/28/2020   Lab Results  Component Value Date   NA 131 (L) 10/28/2020   K 3.9 10/28/2020   CL 95 (L) 10/28/2020   CO2 25 10/28/2020   No results found for: TROPONINI Lab Results  Component Value Date   WBC 17.5 (H) 10/28/2020   HGB 8.8 (L) 10/28/2020   HCT 29.4 (L) 10/28/2020   MCV 82.4 10/28/2020   PLT 649 (H) 10/28/2020   Lab Results  Component Value Date   CHOL 104 10/18/2020   HDL 49 10/18/2020   LDLCALC 38 10/18/2020   TRIG 84 10/18/2020   CHOLHDL 2.1 10/18/2020   Lab Results  Component Value Date   ALT 23 10/28/2020   AST  37 10/28/2020   ALKPHOS 63 10/28/2020   BILITOT 0.5 10/28/2020      Radiology:   CXR: Dense retrocardiac and left mid lung opacity partially silhouetting the left heart border. Could reflect acute consolidation or pneumonia with volume loss in the lingula.  EKG:  Atrial fib.  Rate 107, inferolateral T wave  inversions.  This was not evident on EKG in April.  ASSESSMENT AND PLAN:   ATRIAL FIB:  New onset paroxysmal with an episode of flutter as well.  She is currently back in sinus rhythm.  This may be related to her acute illness but I do suspect she might be doing this at home.  She should have a TSH.  I will resume her previous beta-blocker.  I will hold off on DOAC for now pending an outpatient monitor to see if she is having paroxysms of this.  If so I think she could be taken off of Plavix, continued on aspirin and treated with a DOAC.  ELEVATED CARDIAC ENZYME:   This mild enzyme elevation is consistent with end-stage renal disease, comorbid acute illness with respiratory distress and atrial flutter/fib with rapid rate.  I am not suspecting acute coronary syndrome.  She does have T wave inversions.  We should check an echocardiogram but I am not suspecting that I will suggest an invasive evaluation.  SignedMinus Breeding 10/28/2020, 4:03 PM

## 2020-10-28 NOTE — ED Notes (Signed)
Critical troponin of 107 reported by lab. Primary nurse informed.

## 2020-10-28 NOTE — Progress Notes (Signed)
Patient Demographics:    Christy Zhang, is a 65 y.o. female, DOB - 1956/02/04, [id number]  Admit date - 10/27/2020   Admitting Physician Bernadette Hoit, DO  Outpatient Primary MD for the patient is Claretta Fraise, MD  LOS - 1   Chief Complaint  Patient presents with   Altered Mental Status        Subjective:    Christy Zhang today has no fevers, no emesis,  No chest pain,   -sleepy, wakes up to answer simple questions, no Shob   Assessment  & Plan :    Principal Problem:   CAP (community acquired pneumonia) Active Problems:   ESRD on hemodialysis ([place])   Acute respiratory failure with hypoxia ([place])   Essential (primary) hypertension   PVD (peripheral vascular disease)/ S/p BKA   Anemia of chronic disease   Hyperglycemia due to diabetes mellitus ([place])   Leucocytosis   Thrombocytosis   Altered mental status   Lactic acidosis   Hypoalbuminemia   Hyponatremia   Hyperlipidemia  Brief Summary--  65 y.o. female with medical history significant for bilateral BKA, ESRD on HD (MWF), T2DM, AOCD, hypertension, thrombocytosis, peripheral vascular disease and hyperlipidemia admitted on 10/27/20 with acute hypoxic respiratory failure secondary to PNA and elevated troponin  A/p 1)Lt Sided PNA/CAP--POA--clinical presentation, chest x-ray and lab data consistent with left-sided pneumonia Strep pneumonia and Legionella antigen pending -Continue vancomycin and cefepime pending culture data -Mucolytics, mucolytics and supplemental oxygen  2)Elevated Troponin--- Troponin 79 >> 82>> 109>> 107, ???  -- Suspect type II NSTEMI--in the setting of sepsis secondary to pneumonia and Sepsis in an ESRD patient -Echo pending -Admitting provider discussed case with on-call cardiologist at [place] cardiology consult pending once patient gets to [place] -Continue aspirin, Plavix and  Toprol-XL -As per cardiologist who spoke with admitting provider no need for IV heparin at this time -No evidence of volume overload at this time  3)Acute hypoxic respiratory failure secondary to 1 and 2 above -No evidence of volume overload at this time  4)Sepsis secondary to 1 above--POA--- patient with leukocytosis, tachypnea tachycardia and altered mentation -Lactic acid 2.9, repeat 1.3 PCT 4.20, WBC 18.7, repeat [id number]  5)Acute metabolic encephalopathy secondary to #1, #2 #3 above -Should improve with treatment of above conditions  6)ESRD--on HD M/W/F at at [facility], last HD 10/27/2020- --Nephrology consult appreciated  7)PAD/S/p Bil  BKA--- s/p Rt BKA 09/14/20---staples in place (had Lt BKA on 06/08/20)--- continue aspirin and Plavix, continue Lipitor  8)HTN--- BP was soft,  --Hold Amlodipine 5 mg daily ,  --C/n  Metoprolol XL 50 mg daily with Hold Parameters   --may use IV Hydralazine 10 mg  Every 4 hours Prn for systolic blood pressure over 170 mmhg  9)DM2--- A1c is 6.5, reflecting excellent diabetic control PTA -Reduce Levemir to 8 units from 16 units until pt is more awake and more reliable oral intake Use Novolog/Humalog Sliding scale insulin with Accu-Cheks/Fingersticks as ordered   10)Anemia of ESRD--- Hgb is 8.8 , which is close to baseline -ESA/Procrit per nephrology team  11)HypoNatremia--- use HD to address  Disposition/Need for in-Hospital Stay- patient unable to be discharged at this time due to --sepsis secondary to pneumonia requiring IV antibiotics and supplemental oxygen,  elevated troponins awaiting cardiology evaluation*  Status is: Inpatient  Remains inpatient appropriate because: Please see disposition above  Disposition: The patient is from: Home              Anticipated d/c is to: Home              Anticipated d/c date is: 3 days              Patient currently is not medically stable to d/c. Barriers: Not Clinically Stable-   Code Status :  -   Code Status: Full Code   Family Communication:   None at bedside Consults  :  Cardiology/Nephrology  DVT Prophylaxis  :   - SCDs  heparin injection 5,000 Units Start: 10/27/20 2215 SCDs Start: 10/27/20 2209  Lab Results  Component Value Date   PLT 649 (H) 10/28/2020    Inpatient Medications  Scheduled Meds:  aspirin EC  81 mg Oral QHS   atorvastatin  40 mg Oral Daily   clopidogrel  75 mg Oral Daily   dextromethorphan-guaiFENesin  1 tablet Oral BID   feeding supplement (GLUCERNA SHAKE)  237 mL Oral TID BM   heparin  5,000 Units Subcutaneous Q8H   insulin aspart  0-6 Units Subcutaneous TID WC   insulin detemir  8 Units Subcutaneous QHS   sevelamer carbonate  2,400 mg Oral TID WC   Continuous Infusions:  ceFEPime (MAXIPIME) IV     [START ON 10/30/2020] vancomycin     PRN Meds:.acetaminophen, sevelamer carbonate    Anti-infectives (From admission, onward)    Start     Dose/Rate Route Frequency Ordered Stop   10/30/20 1200  vancomycin (VANCOCIN) IVPB 750 mg/150 ml premix  Status:  Discontinued        750 mg 150 mL/hr over 60 Minutes Intravenous Every M-W-F (Hemodialysis) 10/28/20 0521 10/28/20 1311   10/30/20 1200  vancomycin (VANCOCIN) IVPB 1000 mg/200 mL premix        1,000 mg 200 mL/hr over 60 Minutes Intravenous Every M-W-F (Hemodialysis) 10/28/20 1311     10/28/20 2000  ceFEPIme (MAXIPIME) 1 g in sodium chloride 0.9 % 100 mL IVPB        1 g 200 mL/hr over 30 Minutes Intravenous Every 24 hours 10/27/20 2109     10/27/20 2100  vancomycin (VANCOCIN) IVPB 1000 mg/200 mL premix        1,000 mg 200 mL/hr over 60 Minutes Intravenous  Once 10/27/20 2057 10/28/20 0008   10/27/20 1900  ceFEPIme (MAXIPIME) 2 g in sodium chloride 0.9 % 100 mL IVPB        2 g 200 mL/hr over 30 Minutes Intravenous  Once 10/27/20 1848 10/27/20 2128   10/27/20 1900  metroNIDAZOLE (FLAGYL) IVPB 500 mg        500 mg 100 mL/hr over 60 Minutes Intravenous  Once 10/27/20 1848 10/27/20 2128   10/27/20  1900  vancomycin (VANCOCIN) IVPB 1000 mg/200 mL premix        1,000 mg 200 mL/hr over 60 Minutes Intravenous  Once 10/27/20 1848 10/27/20 2046         Objective:   Vitals:   10/28/20 1230 10/28/20 1300 10/28/20 1330 10/28/20 1457  BP: 117/72 132/70 135/69 123/73  Pulse: 87 89 91 90  Resp: 14 20 (!) 22 19  Temp:    98 F (36.7 C)  TempSrc:    Oral  SpO2: 99% 93% 91% 94%  Weight:      Height:  $'5\' 4"'o$  (1.626 m)    Wt Readings from Last 3 Encounters:  10/28/20 94 kg  09/18/20 94 kg  09/05/20 97.5 kg     Intake/Output Summary (Last 24 hours) at 10/28/2020 1537 Last data filed at 10/28/2020 0708 Gross per 24 hour  Intake 1773.55 ml  Output --  Net 1773.55 ml   Physical Exam Gen:- sleepy, wakes up to answer simple questions  HEENT:- [place].AT, No sclera icterus Nose- [place] 3L/min Neck-Supple Neck,No JVD,.  Lungs-diminished breath sounds with scattered rhonchi  CV- S1, S2 normal, regular  Abd-  +ve B.Sounds, Abd Soft, No tenderness,    Extremity/Skin:-Bilateral BKA , right BKA stump with necrotic fascia and staples psych-affect is flat, sleepy somewhat disoriented,  Neuro-generalized weakness no new focal deficits, no tremors MSK-Right arm AV graft has thrill and bruit present -  Media Information         Document Information  Photos  Bil BKA  10/28/2020 08:47  Attached To:  Hospital Encounter on 10/27/20   Source Information  Roxan Hockey, MD  [facility]    Media Information         Document Information  Photos  Rt BKA stump  10/28/2020 08:46  Attached To:  Hospital Encounter on 10/27/20   Source Information  Roxan Hockey, MD  [facility]     Data Review:   Micro Results Recent Results (from the past 240 hour(s))  Blood Culture (routine x 2)     Status: None (Preliminary result)   Collection Time: 10/27/20  7:27 PM   Specimen: BLOOD LEFT HAND  Result Value Ref Range Status   Specimen Description BLOOD LEFT HAND   Final   Special Requests   Final    BOTTLES DRAWN AEROBIC ONLY Blood Culture adequate volume   Culture   Final    NO GROWTH < 12 HOURS Performed at [facility], [address] [place], [place] [address]    Report Status PENDING  Incomplete  Resp Panel by RT-PCR (Flu A&B, Covid) Nasopharyngeal Swab     Status: None   Collection Time: 10/28/20  5:05 AM   Specimen: Nasopharyngeal Swab; Nasopharyngeal(NP) swabs in vial transport medium  Result Value Ref Range Status   SARS Coronavirus 2 by RT PCR NEGATIVE NEGATIVE Final    Comment: (NOTE) SARS-CoV-2 target nucleic acids are NOT DETECTED.  The SARS-CoV-2 RNA is generally detectable in upper respiratory specimens during the acute phase of infection. The lowest concentration of SARS-CoV-2 viral copies this assay can detect is 138 copies/mL. A negative result does not preclude SARS-Cov-2 infection and should not be used as the sole basis for treatment or other patient management decisions. A negative result may occur with  improper specimen collection/handling, submission of specimen other than nasopharyngeal swab, presence of viral mutation(s) within the areas targeted by this assay, and inadequate number of viral copies(<138 copies/mL). A negative result must be combined with clinical observations, patient history, and epidemiological information. The expected result is Negative.  Fact Sheet for Patients:  EntrepreneurPulse.com.au  Fact Sheet for Healthcare Providers:  IncredibleEmployment.be  This test is no t yet approved or cleared by the [place] FDA and  has been authorized for detection and/or diagnosis of SARS-CoV-2 by FDA under an Emergency Use Authorization (EUA). This EUA will remain  in effect (meaning this test can be used) for the duration of the COVID-19 declaration under Section 564(b)(1) of the Act, 21 U.S.C.section 360bbb-3(b)(1), unless the authorization is terminated  or  revoked sooner.  Influenza A by PCR NEGATIVE NEGATIVE Final   Influenza B by PCR NEGATIVE NEGATIVE Final    Comment: (NOTE) The Xpert Xpress SARS-CoV-2/FLU/RSV plus assay is intended as an aid in the diagnosis of influenza from Nasopharyngeal swab specimens and should not be used as a sole basis for treatment. Nasal washings and aspirates are unacceptable for Xpert Xpress SARS-CoV-2/FLU/RSV testing.  Fact Sheet for Patients: EntrepreneurPulse.com.au  Fact Sheet for Healthcare Providers: IncredibleEmployment.be  This test is not yet approved or cleared by the [place] FDA and has been authorized for detection and/or diagnosis of SARS-CoV-2 by FDA under an Emergency Use Authorization (EUA). This EUA will remain in effect (meaning this test can be used) for the duration of the COVID-19 declaration under Section 564(b)(1) of the Act, 21 U.S.C. section 360bbb-3(b)(1), unless the authorization is terminated or revoked.  Performed at [facility], [address] [place], [place] [address]     Radiology Reports DG Chest [place] 1 View  Result Date: 10/27/2020 CLINICAL DATA:  Confused after dialysis, history of asthma EXAM: PORTABLE CHEST 1 VIEW COMPARISON:  06/22/2020 FINDINGS: Dense retrocardiac and left mid lung opacity partially silhouetting portion of the left heart border. Could reflect a combination of volume loss and airspace consolidation in the lingula. Remaining portions the lungs are otherwise clear aside from basilar atelectasis. Visible portions of the cardiac silhouette suggests some mild cardiomegaly accounting for portable technique. The aorta is calcified. The remaining cardiomediastinal contours are unremarkable. Vascular stenting noted in the right subclavian to axillary vessels. Telemetry leads overlie the chest. Degenerative changes are present in the imaged spine and shoulders. IMPRESSION: Dense retrocardiac and left mid lung  opacity partially silhouetting the left heart border. Could reflect acute consolidation or pneumonia with volume loss in the lingula. Cardiomegaly Aortic Atherosclerosis (ICD10-I70.0). Electronically Signed   By: Lovena Le M.D.   On: 10/27/2020 19:46     CBC Recent Labs  Lab 10/27/20 1851 10/28/20 0358  WBC 18.7* 17.5*  HGB 9.5* 8.8*  HCT 32.0* 29.4*  PLT 736* 649*  MCV 81.8 82.4  MCH 24.3* 24.6*  MCHC 29.7* 29.9*  RDW 18.9* 18.7*  LYMPHSABS 1.6  --   MONOABS 0.8  --   EOSABS 0.1  --   BASOSABS 0.1  --     Chemistries  Recent Labs  Lab 10/27/20 1851 10/28/20 0358  NA 130* 131*  K 4.0 3.9  CL 90* 95*  CO2 25 25  GLUCOSE 230* 207*  BUN 25* 25*  CREATININE 6.44* 6.31*  CALCIUM 8.3* 7.6*  MG  --  1.6*  AST 30 37  ALT 18 23  ALKPHOS 86 63  BILITOT 0.5 0.5   ------------------------------------------------------------------------------------------------------------------ No results for input(s): CHOL, HDL, LDLCALC, TRIG, CHOLHDL, LDLDIRECT in the last 72 hours.  Lab Results  Component Value Date   HGBA1C 6.5 (H) 08/09/2020   ------------------------------------------------------------------------------------------------------------------ No results for input(s): TSH, T4TOTAL, T3FREE, THYROIDAB in the last 72 hours.  Invalid input(s): FREET3 ------------------------------------------------------------------------------------------------------------------ No results for input(s): VITAMINB12, FOLATE, FERRITIN, TIBC, IRON, RETICCTPCT in the last 72 hours.  Coagulation profile Recent Labs  Lab 10/27/20 1851 10/28/20 0358  INR 1.5* 1.5*    No results for input(s): DDIMER in the last 72 hours.  Cardiac Enzymes No results for input(s): CKMB, TROPONINI, MYOGLOBIN in the last 168 hours.  Invalid input(s): CK ------------------------------------------------------------------------------------------------------------------ No results found for: BNP   Roxan Hockey M.D on 10/28/2020 at 3:37 PM  Go to [email or web address] - for contact info  [facility] -  Office  [phone]

## 2020-10-29 ENCOUNTER — Encounter (HOSPITAL_COMMUNITY): Payer: Self-pay | Admitting: Certified Registered Nurse Anesthetist

## 2020-10-29 ENCOUNTER — Inpatient Hospital Stay (HOSPITAL_COMMUNITY): Payer: Medicare Other

## 2020-10-29 DIAGNOSIS — J9601 Acute respiratory failure with hypoxia: Secondary | ICD-10-CM | POA: Diagnosis not present

## 2020-10-29 DIAGNOSIS — R0609 Other forms of dyspnea: Secondary | ICD-10-CM | POA: Diagnosis not present

## 2020-10-29 DIAGNOSIS — I4892 Unspecified atrial flutter: Secondary | ICD-10-CM

## 2020-10-29 DIAGNOSIS — J189 Pneumonia, unspecified organism: Secondary | ICD-10-CM

## 2020-10-29 LAB — POCT I-STAT 7, (LYTES, BLD GAS, ICA,H+H)
Acid-Base Excess: 2 mmol/L (ref 0.0–2.0)
Acid-Base Excess: 0 mmol/L (ref 0.0–2.0)
Acid-base deficit: 4 mmol/L — ABNORMAL HIGH (ref 0.0–2.0)
Bicarbonate: 28.6 mmol/L — ABNORMAL HIGH (ref 20.0–28.0)
Bicarbonate: 25.8 mmol/L (ref 20.0–28.0)
Bicarbonate: 23 mmol/L (ref 20.0–28.0)
Calcium, Ion: 1.15 mmol/L (ref 1.15–1.40)
Calcium, Ion: 1.13 mmol/L — ABNORMAL LOW (ref 1.15–1.40)
Calcium, Ion: 1.07 mmol/L — ABNORMAL LOW (ref 1.15–1.40)
HCT: 31 % — ABNORMAL LOW (ref 36.0–46.0)
HCT: 30 % — ABNORMAL LOW (ref 36.0–46.0)
HCT: 30 % — ABNORMAL LOW (ref 36.0–46.0)
Hemoglobin: 10.5 g/dL — ABNORMAL LOW (ref 12.0–15.0)
Hemoglobin: 10.2 g/dL — ABNORMAL LOW (ref 12.0–15.0)
Hemoglobin: 10.2 g/dL — ABNORMAL LOW (ref 12.0–15.0)
O2 Saturation: 100 %
O2 Saturation: 99 %
O2 Saturation: 100 %
Patient temperature: 98.1
Patient temperature: 98.1
Patient temperature: 35.4
Potassium: 4.6 mmol/L (ref 3.5–5.1)
Potassium: 4.8 mmol/L (ref 3.5–5.1)
Potassium: 3.9 mmol/L (ref 3.5–5.1)
Sodium: 127 mmol/L — ABNORMAL LOW (ref 135–145)
Sodium: 127 mmol/L — ABNORMAL LOW (ref 135–145)
Sodium: 133 mmol/L — ABNORMAL LOW (ref 135–145)
TCO2: 30 mmol/L (ref 22–32)
TCO2: 27 mmol/L (ref 22–32)
TCO2: 24 mmol/L (ref 22–32)
pCO2 arterial: 54.7 mmHg — ABNORMAL HIGH (ref 32.0–48.0)
pCO2 arterial: 42.9 mmHg (ref 32.0–48.0)
pCO2 arterial: 45.6 mmHg (ref 32.0–48.0)
pH, Arterial: 7.326 — ABNORMAL LOW (ref 7.350–7.450)
pH, Arterial: 7.386 (ref 7.350–7.450)
pH, Arterial: 7.302 — ABNORMAL LOW (ref 7.350–7.450)
pO2, Arterial: 312 mmHg — ABNORMAL HIGH (ref 83.0–108.0)
pO2, Arterial: 117 mmHg — ABNORMAL HIGH (ref 83.0–108.0)
pO2, Arterial: 249 mmHg — ABNORMAL HIGH (ref 83.0–108.0)

## 2020-10-29 LAB — BLOOD GAS, ARTERIAL
Acid-base deficit: 0.5 mmol/L (ref 0.0–2.0)
Bicarbonate: 26 mmol/L (ref 20.0–28.0)
Drawn by: 33099
FIO2: 50
O2 Saturation: 94.2 %
Patient temperature: 36.8
pCO2 arterial: 62.4 mmHg — ABNORMAL HIGH (ref 32.0–48.0)
pH, Arterial: 7.242 — ABNORMAL LOW (ref 7.350–7.450)
pO2, Arterial: 83.2 mmHg (ref 83.0–108.0)

## 2020-10-29 LAB — CBC
HCT: 27.1 % — ABNORMAL LOW (ref 36.0–46.0)
Hemoglobin: 8 g/dL — ABNORMAL LOW (ref 12.0–15.0)
MCH: 24.6 pg — ABNORMAL LOW (ref 26.0–34.0)
MCHC: 29.5 g/dL — ABNORMAL LOW (ref 30.0–36.0)
MCV: 83.4 fL (ref 80.0–100.0)
Platelets: 543 10*3/uL — ABNORMAL HIGH (ref 150–400)
RBC: 3.25 MIL/uL — ABNORMAL LOW (ref 3.87–5.11)
RDW: 18.8 % — ABNORMAL HIGH (ref 11.5–15.5)
WBC: 25.5 10*3/uL — ABNORMAL HIGH (ref 4.0–10.5)
nRBC: 2.7 % — ABNORMAL HIGH (ref 0.0–0.2)

## 2020-10-29 LAB — GLUCOSE, CAPILLARY
Glucose-Capillary: 264 mg/dL — ABNORMAL HIGH (ref 70–99)
Glucose-Capillary: 230 mg/dL — ABNORMAL HIGH (ref 70–99)
Glucose-Capillary: 143 mg/dL — ABNORMAL HIGH (ref 70–99)
Glucose-Capillary: 144 mg/dL — ABNORMAL HIGH (ref 70–99)
Glucose-Capillary: 149 mg/dL — ABNORMAL HIGH (ref 70–99)

## 2020-10-29 LAB — ECHOCARDIOGRAM COMPLETE
Area-P 1/2: 3.74 cm2
Height: 64 in
S' Lateral: 2.1 cm
Weight: 3407.43 oz

## 2020-10-29 LAB — BASIC METABOLIC PANEL
Anion gap: 12 (ref 5–15)
BUN: 22 mg/dL (ref 8–23)
CO2: 20 mmol/L — ABNORMAL LOW (ref 22–32)
Calcium: 7.6 mg/dL — ABNORMAL LOW (ref 8.9–10.3)
Chloride: 100 mmol/L (ref 98–111)
Creatinine, Ser: 5 mg/dL — ABNORMAL HIGH (ref 0.44–1.00)
GFR, Estimated: 9 mL/min — ABNORMAL LOW (ref >60)
Glucose, Bld: 147 mg/dL — ABNORMAL HIGH (ref 70–99)
Potassium: 3.9 mmol/L (ref 3.5–5.1)
Sodium: 132 mmol/L — ABNORMAL LOW (ref 135–145)

## 2020-10-29 LAB — HEPARIN LEVEL (UNFRACTIONATED): Heparin Unfractionated: 0.29 IU/mL — ABNORMAL LOW (ref 0.30–0.70)

## 2020-10-29 LAB — MRSA NEXT GEN BY PCR, NASAL: MRSA by PCR Next Gen: NOT DETECTED

## 2020-10-29 LAB — MAGNESIUM: Magnesium: 2 mg/dL (ref 1.7–2.4)

## 2020-10-29 LAB — PHOSPHORUS: Phosphorus: 4.3 mg/dL (ref 2.5–4.6)

## 2020-10-29 MED ORDER — SODIUM CHLORIDE 0.9 % IV BOLUS
250.0000 mL | Freq: Once | INTRAVENOUS | Status: AC
  Administered 2020-10-29: 250 mL via INTRAVENOUS

## 2020-10-29 MED ORDER — ORAL CARE MOUTH RINSE
15.0000 mL | OROMUCOSAL | Status: DC
  Administered 2020-10-29 – 2020-11-06 (×77): 15 mL via OROMUCOSAL

## 2020-10-29 MED ORDER — FENTANYL CITRATE (PF) 100 MCG/2ML IJ SOLN
INTRAMUSCULAR | Status: AC
  Filled 2020-10-29: qty 2

## 2020-10-29 MED ORDER — PRISMASOL BGK 4/2.5 32-4-2.5 MEQ/L REPLACEMENT SOLN: Status: DC

## 2020-10-29 MED ORDER — PANTOPRAZOLE SODIUM 40 MG IV SOLR
40.0000 mg | INTRAVENOUS | Status: DC
  Administered 2020-10-29 – 2020-11-03 (×6): 40 mg via INTRAVENOUS
  Filled 2020-10-29 (×6): qty 40

## 2020-10-29 MED ORDER — FENTANYL CITRATE (PF) 100 MCG/2ML IJ SOLN
100.0000 ug | Freq: Once | INTRAMUSCULAR | Status: DC
Start: 2020-10-29 — End: 2020-11-07

## 2020-10-29 MED ORDER — HEPARIN SODIUM (PORCINE) 1000 UNIT/ML DIALYSIS
1000.0000 [IU] | INTRAMUSCULAR | Status: DC | PRN
  Administered 2020-10-29: 3000 [IU] via INTRAVENOUS_CENTRAL
  Administered 2020-11-02 – 2020-11-07 (×3): 2800 [IU] via INTRAVENOUS_CENTRAL
  Filled 2020-10-29 (×4): qty 6
  Filled 2020-10-29: qty 4
  Filled 2020-10-29: qty 3
  Filled 2020-10-29 (×2): qty 6
  Filled 2020-10-29: qty 3

## 2020-10-29 MED ORDER — ROCURONIUM BROMIDE 10 MG/ML (PF) SYRINGE
PREFILLED_SYRINGE | INTRAVENOUS | Status: AC
  Administered 2020-10-29: 75 mg via INTRAVENOUS
  Filled 2020-10-29: qty 10

## 2020-10-29 MED ORDER — FENTANYL CITRATE (PF) 100 MCG/2ML IJ SOLN
50.0000 ug | Freq: Once | INTRAMUSCULAR | Status: DC
Start: 2020-10-29 — End: 2020-11-07

## 2020-10-29 MED ORDER — ALTEPLASE 2 MG IJ SOLR
2.0000 mg | Freq: Once | INTRAMUSCULAR | Status: DC | PRN
  Filled 2020-10-29: qty 2

## 2020-10-29 MED ORDER — AMIODARONE HCL IN DEXTROSE 360-4.14 MG/200ML-% IV SOLN
60.0000 mg/h | INTRAVENOUS | Status: AC
  Administered 2020-10-29 (×2): 60 mg/h via INTRAVENOUS
  Filled 2020-10-29: qty 200

## 2020-10-29 MED ORDER — FENTANYL 2500MCG IN NS 250ML (10MCG/ML) PREMIX INFUSION
0.0000 ug/h | INTRAVENOUS | Status: DC
  Administered 2020-10-29: 25 ug/h via INTRAVENOUS
  Administered 2020-10-29: 175 ug/h via INTRAVENOUS
  Administered 2020-10-30: 150 ug/h via INTRAVENOUS
  Administered 2020-10-30: 175 ug/h via INTRAVENOUS
  Administered 2020-10-31 – 2020-11-01 (×3): 200 ug/h via INTRAVENOUS
  Administered 2020-11-02: 250 ug/h via INTRAVENOUS
  Administered 2020-11-02: 200 ug/h via INTRAVENOUS
  Administered 2020-11-03: 225 ug/h via INTRAVENOUS
  Administered 2020-11-03 – 2020-11-04 (×2): 175 ug/h via INTRAVENOUS
  Administered 2020-11-04: 250 ug/h via INTRAVENOUS
  Administered 2020-11-05: 175 ug/h via INTRAVENOUS
  Administered 2020-11-06: 100 ug/h via INTRAVENOUS
  Filled 2020-10-29 (×15): qty 250

## 2020-10-29 MED ORDER — SODIUM CHLORIDE 0.9% FLUSH
10.0000 mL | INTRAVENOUS | Status: DC | PRN
  Administered 2020-11-06: 20 mL

## 2020-10-29 MED ORDER — PROPOFOL 1000 MG/100ML IV EMUL
5.0000 ug/kg/min | INTRAVENOUS | Status: DC
  Administered 2020-10-30 – 2020-11-01 (×13): 30 ug/kg/min via INTRAVENOUS
  Administered 2020-11-02: 15 ug/kg/min via INTRAVENOUS
  Filled 2020-10-29 (×15): qty 100

## 2020-10-29 MED ORDER — VANCOMYCIN HCL IN DEXTROSE 1-5 GM/200ML-% IV SOLN
1000.0000 mg | INTRAVENOUS | Status: DC
  Administered 2020-10-29 – 2020-10-30 (×2): 1000 mg via INTRAVENOUS
  Filled 2020-10-29 (×2): qty 200

## 2020-10-29 MED ORDER — PROPOFOL 1000 MG/100ML IV EMUL
INTRAVENOUS | Status: AC
  Administered 2020-10-29: 20 ug/kg/min via INTRAVENOUS
  Filled 2020-10-29: qty 100

## 2020-10-29 MED ORDER — VITAL HIGH PROTEIN PO LIQD
1000.0000 mL | ORAL | Status: DC
  Administered 2020-10-29: 1000 mL

## 2020-10-29 MED ORDER — ETOMIDATE 2 MG/ML IV SOLN
INTRAVENOUS | Status: AC
  Administered 2020-10-29: 20 mg via INTRAVENOUS
  Filled 2020-10-29: qty 20

## 2020-10-29 MED ORDER — APIXABAN 5 MG PO TABS: 5.0000 mg | ORAL_TABLET | Freq: Two times a day (BID) | ORAL | Status: DC

## 2020-10-29 MED ORDER — HEPARIN (PORCINE) 25000 UT/250ML-% IV SOLN
1600.0000 [IU]/h | INTRAVENOUS | Status: DC
  Administered 2020-10-29: 1100 [IU]/h via INTRAVENOUS
  Administered 2020-10-30: 1250 [IU]/h via INTRAVENOUS
  Administered 2020-10-30: 1200 [IU]/h via INTRAVENOUS
  Administered 2020-10-31 – 2020-11-01 (×2): 1250 [IU]/h via INTRAVENOUS
  Administered 2020-11-02: 1200 [IU]/h via INTRAVENOUS
  Administered 2020-11-03 – 2020-11-05 (×2): 1050 [IU]/h via INTRAVENOUS
  Administered 2020-11-06 – 2020-11-08 (×3): 1300 [IU]/h via INTRAVENOUS
  Administered 2020-11-09: 1400 [IU]/h via INTRAVENOUS
  Administered 2020-11-10: 1450 [IU]/h via INTRAVENOUS
  Administered 2020-11-11 – 2020-11-12 (×3): 1500 [IU]/h via INTRAVENOUS
  Administered 2020-11-13: 1600 [IU]/h via INTRAVENOUS
  Filled 2020-10-29 (×20): qty 250

## 2020-10-29 MED ORDER — HEPARIN BOLUS VIA INFUSION
3000.0000 [IU] | Freq: Once | INTRAVENOUS | Status: AC
  Administered 2020-10-29: 3000 [IU] via INTRAVENOUS
  Filled 2020-10-29: qty 3000

## 2020-10-29 MED ORDER — CHLORHEXIDINE GLUCONATE CLOTH 2 % EX PADS
6.0000 | MEDICATED_PAD | Freq: Every day | CUTANEOUS | Status: DC
  Administered 2020-10-29 – 2020-10-31 (×4): 6 via TOPICAL

## 2020-10-29 MED ORDER — AMIODARONE HCL IN DEXTROSE 360-4.14 MG/200ML-% IV SOLN
30.0000 mg/h | INTRAVENOUS | Status: DC
  Administered 2020-10-29 – 2020-11-04 (×13): 30 mg/h via INTRAVENOUS
  Filled 2020-10-29 (×13): qty 200

## 2020-10-29 MED ORDER — ATORVASTATIN CALCIUM 40 MG PO TABS
40.0000 mg | ORAL_TABLET | Freq: Every day | ORAL | Status: DC
  Administered 2020-10-30 – 2020-11-14 (×14): 40 mg
  Filled 2020-10-29 (×14): qty 1

## 2020-10-29 MED ORDER — ACETAMINOPHEN 160 MG/5ML PO SOLN
650.0000 mg | Freq: Four times a day (QID) | ORAL | Status: DC | PRN
  Administered 2020-11-02 – 2020-11-12 (×5): 650 mg
  Filled 2020-10-29 (×5): qty 20.3

## 2020-10-29 MED ORDER — CHLORHEXIDINE GLUCONATE 0.12% ORAL RINSE (MEDLINE KIT)
15.0000 mL | Freq: Two times a day (BID) | OROMUCOSAL | Status: DC
  Administered 2020-10-29 – 2020-11-06 (×15): 15 mL via OROMUCOSAL

## 2020-10-29 MED ORDER — GUAIFENESIN 100 MG/5ML PO SOLN
15.0000 mL | Freq: Four times a day (QID) | ORAL | Status: DC
  Administered 2020-10-29 – 2020-11-04 (×24): 300 mg
  Filled 2020-10-29 (×7): qty 15
  Filled 2020-10-29: qty 10
  Filled 2020-10-29 (×7): qty 15
  Filled 2020-10-29: qty 5
  Filled 2020-10-29 (×4): qty 15
  Filled 2020-10-29: qty 5
  Filled 2020-10-29 (×4): qty 15

## 2020-10-29 MED ORDER — PRISMASOL BGK 4/2.5 32-4-2.5 MEQ/L EC SOLN: Status: DC

## 2020-10-29 MED ORDER — SODIUM CHLORIDE 0.9% FLUSH
10.0000 mL | Freq: Two times a day (BID) | INTRAVENOUS | Status: DC
Start: 2020-10-29 — End: 2020-11-14
  Administered 2020-11-04 – 2020-11-14 (×16): 10 mL

## 2020-10-29 MED ORDER — SODIUM CHLORIDE 0.9 % IV SOLN
2.0000 g | Freq: Two times a day (BID) | INTRAVENOUS | Status: DC
  Administered 2020-10-29 – 2020-11-02 (×9): 2 g via INTRAVENOUS
  Filled 2020-10-29 (×9): qty 2

## 2020-10-29 MED ORDER — ASPIRIN 81 MG PO CHEW
81.0000 mg | CHEWABLE_TABLET | Freq: Every day | ORAL | Status: DC
  Administered 2020-10-29 – 2020-11-14 (×16): 81 mg
  Filled 2020-10-29 (×16): qty 1

## 2020-10-29 MED ORDER — ROCURONIUM BROMIDE 10 MG/ML (PF) SYRINGE: 75.0000 mg | PREFILLED_SYRINGE | Freq: Once | INTRAVENOUS | Status: AC

## 2020-10-29 MED ORDER — CLOPIDOGREL BISULFATE 75 MG PO TABS
75.0000 mg | ORAL_TABLET | Freq: Every day | ORAL | Status: DC
  Administered 2020-10-30 – 2020-11-14 (×16): 75 mg
  Filled 2020-10-29 (×16): qty 1

## 2020-10-29 MED ORDER — SODIUM CHLORIDE 0.9 % FOR CRRT: INTRAVENOUS_CENTRAL | Status: DC | PRN

## 2020-10-29 MED ORDER — AMIODARONE LOAD VIA INFUSION
150.0000 mg | Freq: Once | INTRAVENOUS | Status: AC
  Administered 2020-10-29: 150 mg via INTRAVENOUS
  Filled 2020-10-29: qty 83.34

## 2020-10-29 MED ORDER — NOREPINEPHRINE 4 MG/250ML-% IV SOLN
0.0000 ug/min | INTRAVENOUS | Status: DC
  Administered 2020-10-29: 10 ug/min via INTRAVENOUS
  Filled 2020-10-29: qty 250

## 2020-10-29 MED ORDER — MIDAZOLAM HCL 2 MG/2ML IJ SOLN
2.0000 mg | INTRAMUSCULAR | Status: DC | PRN
  Administered 2020-10-29 – 2020-11-06 (×2): 2 mg via INTRAVENOUS
  Filled 2020-10-29 (×3): qty 2

## 2020-10-29 MED ORDER — NOREPINEPHRINE 4 MG/250ML-% IV SOLN
INTRAVENOUS | Status: AC
  Administered 2020-10-29: 5 ug/min via INTRAVENOUS
  Filled 2020-10-29: qty 250

## 2020-10-29 MED ORDER — NOREPINEPHRINE 16 MG/250ML-% IV SOLN
0.0000 ug/min | INTRAVENOUS | Status: DC
  Administered 2020-10-29: 50 ug/min via INTRAVENOUS
  Administered 2020-10-30: 22 ug/min via INTRAVENOUS
  Administered 2020-10-30: 12 ug/min via INTRAVENOUS
  Administered 2020-10-31: 14 ug/min via INTRAVENOUS
  Administered 2020-11-01: 15 ug/min via INTRAVENOUS
  Administered 2020-11-02: 18 ug/min via INTRAVENOUS
  Filled 2020-10-29 (×6): qty 250

## 2020-10-29 MED ORDER — PROSOURCE TF PO LIQD
45.0000 mL | Freq: Two times a day (BID) | ORAL | Status: DC
  Administered 2020-10-29 – 2020-10-30 (×3): 45 mL
  Filled 2020-10-29 (×3): qty 45

## 2020-10-29 MED ORDER — INSULIN ASPART 100 UNIT/ML IJ SOLN
0.0000 [IU] | INTRAMUSCULAR | Status: DC
  Administered 2020-10-30: 1 [IU] via SUBCUTANEOUS
  Administered 2020-10-30 (×3): 2 [IU] via SUBCUTANEOUS
  Administered 2020-10-30: 1 [IU] via SUBCUTANEOUS

## 2020-10-29 MED ORDER — ETOMIDATE 2 MG/ML IV SOLN: 25.0000 mg | Freq: Once | INTRAVENOUS | Status: AC

## 2020-10-29 NOTE — Progress Notes (Signed)
Called to see for resp distress, pulm edema.  Pt developed tachyarrhythmia in 130's and hypotension.  Repeat CXR tonight showed new pulm edema and effusions. Pt was placed on bipap and was hypotensive. NS bolus improved bp into low 100's, previously in the 80's. Telemetry shows HR 130's, regular. Resp status is stable now on bipap, however patient is not stable for regular dialysis given hypotension and tachycardia.  Have d/w CCM MD, plan is for transfer to ICU and CRRT.  Kelly Splinter, MD 10/29/2020, 2:01 AM

## 2020-10-29 NOTE — Progress Notes (Signed)
   10/29/20 1500  Clinical Encounter Type  Visited With Patient not available  Visit Type Code  Referral From Nurse  Consult/Referral To Chaplain  The chaplain responded to code blue. Upon arrival the code was completed, and a pulse was obtained. No family present. No chaplain services needed at this time. The chaplain will follow up as needed.

## 2020-10-29 NOTE — Procedures (Signed)
Arterial Catheter Insertion Procedure Note  HEART SCHREITER  MV:[id number]  23-Feb-1956  Date:10/29/20  Time:4:23 PM    Provider Performing: Kipp Brood    Procedure: Insertion of Arterial Line [phone]) with US guidance [id number])   Indication(s) Blood pressure monitoring and/or need for frequent ABGs  Consent Unable to obtain consent due to emergent nature of procedure.  Anesthesia 1% lidocaine   Time Out Verified patient identification, verified procedure, site/side was marked, verified correct patient position, special equipment/implants available, medications/allergies/relevant history reviewed, required imaging and test results available.   Sterile Technique Maximal sterile technique including full sterile barrier drape, hand hygiene, sterile gown, sterile gloves, mask, hair covering, sterile ultrasound probe cover (if used).   Procedure Description Area of catheter insertion was cleaned with chlorhexidine and draped in sterile fashion. With real-time ultrasound guidance an arterial catheter was placed into the left radial artery.  Appropriate arterial tracings confirmed on monitor.     Complications/Tolerance None; patient tolerated the procedure well.   EBL Minimal  Kipp Brood, MD [facility] ICU Physician [place]  Pager: [phone] Or Epic Secure Chat After hours: [phone].  10/29/2020, 4:24 PM

## 2020-10-29 NOTE — Progress Notes (Signed)
Progress note   Brief PEA arrest after a period of agitation, responded to 1 amp of epinephrine and 2 min of CPR.  Suspect vagal response during agitation.  CXR unremarkable. Will send CBC and BMET to evaluate other causes.   POC echo shows small effusion with no tamponade features. RV and LV essentially normal.   Attempted to contact daughter - no answer.  Kipp Brood, MD [facility] ICU Physician [place]  Pager: [phone] Or Epic Secure Chat After hours: [phone].  10/29/2020, 4:22 PM

## 2020-10-29 NOTE — Progress Notes (Signed)
Progress Note  Patient Name: Christy Zhang Date of Encounter: 10/29/2020  Primary Cardiologist:   None   Subjective   Developed acute respiratory failure last night.    This was possibly precipitated by atrial flutter.  Now intubated   Inpatient Medications    Scheduled Meds:  aspirin EC  81 mg Oral Q breakfast   atorvastatin  40 mg Oral Daily   Chlorhexidine Gluconate Cloth  6 each Topical Daily   clopidogrel  75 mg Oral Daily   feeding supplement (PROSource TF)  45 mL Per Tube BID   feeding supplement (VITAL HIGH PROTEIN)  1,000 mL Per Tube Q24H   fentaNYL (SUBLIMAZE) injection  100 mcg Intravenous Once   fentaNYL (SUBLIMAZE) injection  50 mcg Intravenous Once   guaiFENesin  600 mg Oral BID   insulin aspart  0-6 Units Subcutaneous TID WC   insulin detemir  8 Units Subcutaneous QHS   ipratropium-albuterol  3 mL Nebulization TID   mouth rinse  15 mL Mouth Rinse BID   pantoprazole (PROTONIX) IV  40 mg Intravenous Q24H   Continuous Infusions:   prismasol BGK 4/2.5 400 mL/hr at 10/29/20 0539    prismasol BGK 4/2.5 200 mL/hr at 10/29/20 0539   amiodarone 30 mg/hr (10/29/20 1100)   ceFEPime (MAXIPIME) IV Stopped (10/29/20 1018)   fentaNYL infusion INTRAVENOUS 200 mcg/hr (10/29/20 1100)   heparin 1,100 Units/hr (10/29/20 1100)   norepinephrine (LEVOPHED) Adult infusion 8 mcg/min (10/29/20 1100)   prismasol BGK 4/2.5 1,800 mL/hr at 10/29/20 1124   vancomycin     PRN Meds: acetaminophen, albuterol, alteplase, heparin, hydrALAZINE, metoprolol tartrate, midazolam, sodium chloride   Vital Signs    Vitals:   10/29/20 1045 10/29/20 1100 10/29/20 1115 10/29/20 1130  BP: (!) 144/53 (!) 118/37 (!) 117/41 (!) 110/50  Pulse: 76 70 65 62  Resp: (!) 0 (!) 0 (!) 8   Temp:      TempSrc:      SpO2: 100% 100% 100% 100%  Weight:      Height:        Intake/Output Summary (Last 24 hours) at 10/29/2020 1238 Last data filed at 10/29/2020 1100 Gross per 24 hour  Intake 1292.72 ml   Output 438 ml  Net 854.72 ml   Filed Weights   10/28/20 0735 10/28/20 1457 10/29/20 0337  Weight: 94 kg 89.7 kg 96.6 kg    Telemetry    Atrial flutter with controlled ventricular rate - Personally Reviewed  ECG    No ECG today - Personally Reviewed  Physical Exam   GEN:   Intubated and sedated Neck: No  JVD Cardiac: Irregular RR, no murmurs, rubs, or gallops.  Respiratory:       Decreased breath sounds GI: Soft, nontender, non-distended  MS: No edema; No deformity.  Bilateral lower extremity amputations Neuro:  Intubated and sedated.   Labs    Chemistry Recent Labs  Lab 10/27/20 1851 10/28/20 0358 10/29/20 0443 10/29/20 0545  NA 130* 131* 127* 127*  K 4.0 3.9 4.6 4.8  CL 90* 95*  --   --   CO2 25 25  --   --   GLUCOSE 230* 207*  --   --   BUN 25* 25*  --   --   CREATININE 6.44* 6.31*  --   --   CALCIUM 8.3* 7.6*  --   --   PROT 7.6 6.7  --   --   ALBUMIN 2.8* 2.5*  --   --  AST 30 37  --   --   ALT 18 23  --   --   ALKPHOS 86 63  --   --   BILITOT 0.5 0.5  --   --   GFRNONAA 7* 7*  --   --   ANIONGAP 15 11  --   --      Hematology Recent Labs  Lab 10/27/20 1851 10/28/20 0358 10/29/20 0443 10/29/20 0545  WBC 18.7* 17.5*  --   --   RBC 3.91 3.57*  --   --   HGB 9.5* 8.8* 10.5* 10.2*  HCT 32.0* 29.4* 31.0* 30.0*  MCV 81.8 82.4  --   --   MCH 24.3* 24.6*  --   --   MCHC 29.7* 29.9*  --   --   RDW 18.9* 18.7*  --   --   PLT 736* 649*  --   --     Cardiac EnzymesNo results for input(s): TROPONINI in the last 168 hours. No results for input(s): TROPIPOC in the last 168 hours.   BNPNo results for input(s): BNP, PROBNP in the last 168 hours.   DDimer No results for input(s): DDIMER in the last 168 hours.   Radiology    DG Abd 1 View  Result Date: 10/29/2020 CLINICAL DATA:  Status post or gastric feeding tube placement. EXAM: ABDOMEN - 1 VIEW COMPARISON:  Chest x-ray today FINDINGS: Enteric tube has been placed, tip overlying the level of the  proximal to mid stomach. There is persistent dense opacity in the LEFT LOWER lobe. Paucity of bowel gas. IMPRESSION: Enteric tube tip overlying the level of the stomach. Electronically Signed   By: [name] M.D.   On: 10/29/2020 12:15   DG Chest Port 1 View  Result Date: 10/29/2020 CLINICAL DATA:  65 year old female with hypoxia.  Intubated. EXAM: PORTABLE CHEST 1 VIEW COMPARISON:  10/28/2020 and earlier. FINDINGS: Portable AP semi upright view at 0446 hours. Endotracheal tube in good position projecting over the airway between the clavicles and carina. A left chest approach vascular catheter projects at the cavoatrial junction region. Mildly improved lung volumes and decreased but not resolved veiling opacity in the left hemithorax with continued obscuration of the left hemidiaphragm. Dense retrocardiac opacity. Probable cardiomegaly. Improved right lung base ventilation. No pneumothorax. Paucity of bowel gas in the upper abdomen. IMPRESSION: 1. Endotracheal tube in good position. Left chest approach central line tip in good position near the cavoatrial junction. 2. Mildly improved lung volumes and ventilation. Continued left greater than right pleural effusions, with left lower lobe collapse or consolidation. Electronically Signed   By: Genevie Ann M.D.   On: 10/29/2020 05:13   DG CHEST PORT 1 VIEW  Result Date: 10/28/2020 CLINICAL DATA:  Hypoxemia. EXAM: PORTABLE CHEST 1 VIEW COMPARISON:  Radiograph yesterday. FINDINGS: Cardiomegaly. Worsening pleural effusions. Retrocardiac opacity is partially obscured by pleural fluid on the current exam. Lower lung volumes likely due to increasing compressive atelectasis. Increasing vascular congestion. Vascular stent in the region of the right subclavian. No pneumothorax. IMPRESSION: 1. Worsening pleural effusions and vascular congestion. Left pleural effusion obscures retrocardiac opacity. 2. Lower lung volumes likely due to increasing compressive atelectasis. 3.  Cardiomegaly. Electronically Signed   By: Keith Rake M.D.   On: 10/28/2020 22:32   DG Chest Port 1 View  Result Date: 10/27/2020 CLINICAL DATA:  Confused after dialysis, history of asthma EXAM: PORTABLE CHEST 1 VIEW COMPARISON:  06/22/2020 FINDINGS: Dense retrocardiac and left mid lung opacity  partially silhouetting portion of the left heart border. Could reflect a combination of volume loss and airspace consolidation in the lingula. Remaining portions the lungs are otherwise clear aside from basilar atelectasis. Visible portions of the cardiac silhouette suggests some mild cardiomegaly accounting for portable technique. The aorta is calcified. The remaining cardiomediastinal contours are unremarkable. Vascular stenting noted in the right subclavian to axillary vessels. Telemetry leads overlie the chest. Degenerative changes are present in the imaged spine and shoulders. IMPRESSION: Dense retrocardiac and left mid lung opacity partially silhouetting the left heart border. Could reflect acute consolidation or pneumonia with volume loss in the lingula. Cardiomegaly Aortic Atherosclerosis (ICD10-I70.0). Electronically Signed   By: Lovena Le M.D.   On: 10/27/2020 19:46   ECHOCARDIOGRAM COMPLETE  Result Date: 10/29/2020    ECHOCARDIOGRAM REPORT   Patient Name:   Christy Zhang Date of Exam: 10/29/2020 Medical Rec #:  [id number]         Height:       64.0 in Accession #:    [id number]        Weight:       213.0 lb Date of Birth:  October 16, 1955         BSA:          2.009 m Patient Age:    11 years          BP:           54/105 mmHg Patient Gender: F                 HR:           101 bpm. Exam Location:  Inpatient Procedure: 2D Echo, Color Doppler and Cardiac Doppler Indications:    R06.9 DOE  History:        Patient has no prior history of Echocardiogram examinations.                 CHF; Risk Factors:Hypertension, Diabetes and Dyslipidemia.  Sonographer:    Raquel Sarna Senior RDCS Referring Phys: [phone] [facility]  Sonographer Comments: Scanned supine on artificial respirator. IMPRESSIONS  1. Left ventricular ejection fraction, by estimation, is 55 to 60%. The left ventricle has normal function. The left ventricle has no regional wall motion abnormalities. There is moderate left ventricular hypertrophy. Left ventricular diastolic parameters are indeterminate.  2. Right ventricular systolic function is normal. The right ventricular size is normal. There is moderately elevated pulmonary artery systolic pressure.  3. A small pericardial effusion is present.  4. The mitral valve is normal in structure. No evidence of mitral valve regurgitation. No evidence of mitral stenosis.  5. The aortic valve is tricuspid. Aortic valve regurgitation is not visualized. No aortic stenosis is present.  6. The inferior vena cava is dilated in size with <50% respiratory variability, suggesting right atrial pressure of 15 mmHg. FINDINGS  Left Ventricle: Left ventricular ejection fraction, by estimation, is 55 to 60%. The left ventricle has normal function. The left ventricle has no regional wall motion abnormalities. The left ventricular internal cavity size was normal in size. There is  moderate left ventricular hypertrophy. Left ventricular diastolic parameters are indeterminate. Right Ventricle: The right ventricular size is normal.Right ventricular systolic function is normal. There is moderately elevated pulmonary artery systolic pressure. The tricuspid regurgitant velocity is 3.09 m/s, and with an assumed right atrial pressure of 15 mmHg, the estimated right ventricular systolic pressure is [id number] mmHg. Left Atrium: Left atrial size was normal in size. Right Atrium: Right  atrial size was normal in size. Pericardium: A small pericardial effusion is present. Mitral Valve: The mitral valve is normal in structure. No evidence of mitral valve regurgitation. No evidence of mitral valve stenosis. Tricuspid Valve: The tricuspid valve is normal  in structure. Tricuspid valve regurgitation is mild . No evidence of tricuspid stenosis. Aortic Valve: The aortic valve is tricuspid. Aortic valve regurgitation is not visualized. No aortic stenosis is present. Pulmonic Valve: The pulmonic valve was normal in structure. Pulmonic valve regurgitation is trivial. No evidence of pulmonic stenosis. Aorta: The aortic root is normal in size and structure. Venous: The inferior vena cava is dilated in size with less than 50% respiratory variability, suggesting right atrial pressure of 15 mmHg. IAS/Shunts: No atrial level shunt detected by color flow Doppler.  LEFT VENTRICLE PLAX 2D LVIDd:         3.30 cm  Diastology LVIDs:         2.10 cm  LV e' medial:    5.22 cm/s LV PW:         1.50 cm  LV E/e' medial:  14.2 LV IVS:        1.30 cm  LV e' lateral:   6.85 cm/s LVOT diam:     1.80 cm  LV E/e' lateral: 10.8 LV SV:         31 LV SV Index:   15 LVOT Area:     2.54 cm  RIGHT VENTRICLE RV S prime:     9.36 cm/s TAPSE (M-mode): 1.1 cm LEFT ATRIUM           Index       RIGHT ATRIUM           Index LA diam:      4.10 cm 2.04 cm/m  RA Area:     13.60 cm LA Vol (A2C): 49.1 ml 24.44 ml/m RA Volume:   37.10 ml  18.46 ml/m LA Vol (A4C): 50.4 ml 25.08 ml/m  AORTIC VALVE LVOT Vmax:   76.65 cm/s LVOT Vmean:  49.000 cm/s LVOT VTI:    0.122 m  AORTA Ao Root diam: 2.80 cm Ao Asc diam:  3.00 cm MITRAL VALVE               TRICUSPID VALVE MV Area (PHT): 3.74 cm    TR Peak grad:   38.2 mmHg MV Decel Time: 203 msec    TR Vmax:        309.00 cm/s MV E velocity: 73.90 cm/s MV A velocity: 46.10 cm/s  SHUNTS MV E/A ratio:  1.60        Systemic VTI:  0.12 m                            Systemic Diam: 1.80 cm Kirk Ruths MD Electronically signed by Kirk Ruths MD Signature Date/Time: 10/29/2020/10:42:54 AM    Final     Cardiac Studies   ECHO:   1. Left ventricular ejection fraction, by estimation, is 55 to 60%. The  left ventricle has normal function. The left ventricle has no regional   wall motion abnormalities. There is moderate left ventricular hypertrophy.  Left ventricular diastolic  parameters are indeterminate.   2. Right ventricular systolic function is normal. The right ventricular  size is normal. There is moderately elevated pulmonary artery systolic  pressure.   3. A small pericardial effusion is present.   4. The mitral valve is normal in structure. No  evidence of mitral valve  regurgitation. No evidence of mitral stenosis.   5. The aortic valve is tricuspid. Aortic valve regurgitation is not  visualized. No aortic stenosis is present.   6. The inferior vena cava is dilated in size with <50% respiratory  variability, suggesting right atrial pressure of 15 mmHg.   Patient Profile     65 y.o. female the patient has a history of end-stage renal disease and diabetes mellitus.  She is dialysis dependent.  She has had peripheral vascular disease with bilateral amputations.  She has not had prior cardiac history.  Assessment & Plan    RESPIRATORY FAILURE:  Acute on chronic diastolic failure on previous pneumonia with worsening edema related to atrial flutter with rapid ventricular rate.  Vent per CCM, volume management with CRRT  SHOCK:  On norepi.   Weaning down.   ATRIAL FLUTTER:    On IV amiodarone.  Continue and likely will need PO transition given her response to rapid flutter.  Currently and intermittently this has been fib so ablation not likely an option.  She clearly needs rhythm control.  She will long term anticoagulation.    For questions or updates, please contact [place] Please consult [email or web address] for contact info under Cardiology/STEMI.   Signed, Minus Breeding, MD  10/29/2020, 12:38 PM

## 2020-10-29 NOTE — Progress Notes (Signed)
Echocardiogram 2D Echocardiogram has been performed.  Oneal Deputy Harla Mensch RDCS 10/29/2020, 8:33 AM

## 2020-10-29 NOTE — Progress Notes (Signed)
ANTICOAGULATION CONSULT NOTE - Initial Consult  Pharmacy Consult for Heparin  Indication: atrial fibrillation  Allergies  Allergen Reactions   Sulfa Antibiotics Swelling    Patient Measurements: Height: '5\' 4"'$  (162.6 cm) Weight: 96.6 kg (212 lb 15.4 oz) IBW/kg (Calculated) : 54.7   Vital Signs: Temp: 98.1 F (36.7 C) (07/03 0300) Temp Source: Oral (07/03 0300) BP: 126/44 (07/03 0600) Pulse Rate: 67 (07/03 0600)  Labs: Recent Labs    10/27/20 1851 10/27/20 2223 10/28/20 0130 10/28/20 0358 10/28/20 0552 10/29/20 0443 10/29/20 0545  HGB 9.5*  --   --  8.8*  --  10.5* 10.2*  HCT 32.0*  --   --  29.4*  --  31.0* 30.0*  PLT 736*  --   --  649*  --   --   --   APTT 31  --   --  33  --   --   --   LABPROT 18.3*  --   --  17.9*  --   --   --   INR 1.5*  --   --  1.5*  --   --   --   CREATININE 6.44*  --   --  6.31*  --   --   --   TROPONINIHS  --    < > 82* 109* 107*  --   --    < > = values in this interval not displayed.    Estimated Creatinine Clearance: 10.3 mL/min (A) (by C-G formula based on SCr of 6.31 mg/dL (H)).   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 ESRD on HD now starting CRRT. She is also noted to be in afib/flutter and beginning IV heparin per pharmacy. Hgb 10.2.   Goal of Therapy:  Heparin level 0.3-0.7 units/ml Monitor platelets by anticoagulation protocol: Yes   Plan:  Heparin 3000 units BOLUS Start heparin drip at 1100 units/hr 1500 Heparin level Daily CBC/Heparin level Monitor for bleeding  Narda Bonds, PharmD, BCPS Clinical Pharmacist Phone: [phone]

## 2020-10-29 NOTE — Progress Notes (Signed)
NAME:  Christy Zhang, MRN:  [id number], DOB:  01-13-1956, LOS: 2 ADMISSION DATE:  10/27/2020, CONSULTATION DATE:  10/29/20 REFERRING MD: Dr. Tyrell Antonio , CHIEF COMPLAINT:  Hypotension, A flutter, Volume overload  History of Present Illness:  This is a 65 yo female with history of BKA, ESRD (HD MWF), T2DM, HTN, Thrombocytosis, PVD, and HLD, admitted for hypoxic respiratory failure 2/2 PNA. Patient was noted to be confused in the context of 2 weeks of SOB and dry cough. Patient was noted to be weak after dialysis on 10/27/20. Continue to be more confused and weak and thus EMS was called. Patient brought to the ED and workup notable for LA of 2.9 and borderline hypotnesion. Pateint admitted for likely pna.   Patietn was on the floor and seemed to be doing fairly well until she became tachycardic on eve of 10/28/20. EKG noted A flutter. Also patient noted to be hypotnesive and thus cardiology considered cardioversion. Pateint received a 500 cc bolus and pressure improved and thus cardioversion aborted. CXR noable for enlraged pleura effusions and sign of increasing overload. VBG notable for PH of 7.24 and CO2 of 62.4.   Pertinent  Medical History  CHF CKD T2DM ESRD HLD HTN  Significant Hospital Events: Including procedures, antibiotic start and stop dates in addition to other pertinent events   Admitted to ICU on 10/29/20 for CRRT  Interim History / Subjective:  NA  Objective   Blood pressure (!) 85/61, pulse (!) 138, temperature 98.3 F (36.8 C), temperature source Axillary, resp. rate 17, height '5\' 4"'$  (1.626 m), weight 89.7 kg, SpO2 98 %.    FiO2 (%):  [50 %] 50 %   Intake/Output Summary (Last 24 hours) at 10/29/2020 0156 Last data filed at 10/28/2020 1600 Gross per 24 hour  Intake 407.21 ml  Output --  Net 407.21 ml   Filed Weights   10/28/20 0735 10/28/20 1457  Weight: 94 kg 89.7 kg    Examination: General: Patient awake on the bipap HENT: Pupils equally round Lungs: Coars breath  sounds. No wheezes noted.  Cardiovascular: Tachycardic but regular Abdomen: Soft non tender and non distended Extremities: amputations noted bilaterally.  Neuro: No focal deficits appreciated GU: Deferred  Resolved Hospital Problem list   NA  Assessment & Plan:  This is a 65 yo female with history as noted above for who critical care was consulted for hypotension, A flutter, and volume overload   ESRD Volume overload-Will need dilaysis but cannot given hypotension -CRRT -Place central line. Consent attained from patient. Will also aattempt to call fmaily as patient waxes and wanes in terms of mental status  Hypercapnic respiratory failure-Likely 2/2 volume overload -CRRT this evening -Bipap in the interim -Low threshold for intubtaion given patient being treated for pna and bipap not the trx of choice in this scenario  A-Flutter-2:2 block -Amiodarone -Heparin infusion  PNA with Pleural effusions-in the setting of pna and worsening respirataory failure most consider parapneumoni effusions -Stat CT chest after patient able to lay flat for further investigation of complication of pna -Consider thora if failing to improve.  -FU strep and legionella -Continue Vancomycin and cefepime  DM2- -Continue reduced dose levemir and ISS     Best Practice (right click and "Reselect all SmartList Selections" daily)   Diet/type: NPO DVT prophylaxis: systemic heparin GI prophylaxis: N/A Lines: Dialysis Catheter Foley:  N/A Code Status:  full code Last date of multidisciplinary goals of care discussion [Spoke with patient concerning code status and line placement  she was Aox3 at that time]  Labs   CBC: Recent Labs  Lab 10/27/20 1851 10/28/20 0358  WBC 18.7* 17.5*  NEUTROABS 15.7*  --   HGB 9.5* 8.8*  HCT 32.0* 29.4*  MCV 81.8 82.4  PLT 736* 649*    Basic Metabolic Panel: Recent Labs  Lab 10/27/20 1851 10/28/20 0358  NA 130* 131*  K 4.0 3.9  CL 90* 95*  CO2 25 25   GLUCOSE 230* 207*  BUN 25* 25*  CREATININE 6.44* 6.31*  CALCIUM 8.3* 7.6*  MG  --  1.6*  PHOS  --  5.7*   GFR: Estimated Creatinine Clearance: 9.9 mL/min (A) (by C-G formula based on SCr of 6.31 mg/dL (H)). Recent Labs  Lab 10/27/20 1851 10/27/20 1927 10/27/20 2150 10/28/20 0133 10/28/20 0358  PROCALCITON  --   --   --   --  4.20  WBC 18.7*  --   --   --  17.5*  LATICACIDVEN  --  2.9* 2.0* 2.0* 1.3    Liver Function Tests: Recent Labs  Lab 10/27/20 1851 10/28/20 0358  AST 30 37  ALT 18 23  ALKPHOS 86 63  BILITOT 0.5 0.5  PROT 7.6 6.7  ALBUMIN 2.8* 2.5*   No results for input(s): LIPASE, AMYLASE in the last 168 hours. No results for input(s): AMMONIA in the last 168 hours.  ABG    Component Value Date/Time   PHART 7.242 (L) 10/29/2020 0125   PCO2ART 62.4 (H) 10/29/2020 0125   PO2ART 83.2 10/29/2020 0125   HCO3 26.0 10/29/2020 0125   TCO2 28 06/08/2020 0812   ACIDBASEDEF 0.5 10/29/2020 0125   O2SAT 94.2 10/29/2020 0125     Coagulation Profile: Recent Labs  Lab 10/27/20 1851 10/28/20 0358  INR 1.5* 1.5*    Cardiac Enzymes: No results for input(s): CKTOTAL, CKMB, CKMBINDEX, TROPONINI in the last 168 hours.  HbA1C: HB A1C (BAYER DCA - WAIVED)  Date/Time Value Ref Range Status  04/06/2020 11:05 AM 5.4 <7.0 % Final    Comment:                                          Diabetic Adult            <7.0                                       Healthy Adult        4.3 - 5.7                                                           (DCCT/NGSP) American Diabetes Association's Summary of Glycemic Recommendations for Adults with Diabetes: Hemoglobin A1c <7.0%. More stringent glycemic goals (A1c <6.0%) may further reduce complications at the cost of increased risk of hypoglycemia.   01/06/2020 10:13 AM 6.2 <7.0 % Final    Comment:                                          Diabetic  Adult            <7.0                                       Healthy Adult         4.3 - 5.7                                                           (DCCT/NGSP) American Diabetes Association's Summary of Glycemic Recommendations for Adults with Diabetes: Hemoglobin A1c <7.0%. More stringent glycemic goals (A1c <6.0%) may further reduce complications at the cost of increased risk of hypoglycemia.    Hgb A1c MFr Bld  Date/Time Value Ref Range Status  08/09/2020 05:23 AM 6.5 (H) 4.8 - 5.6 % Final    Comment:    (NOTE) Pre diabetes:          5.7%-6.4%  Diabetes:              >6.4%  Glycemic control for   <7.0% adults with diabetes   08/09/2020 03:48 AM 8.8 (H) 4.8 - 5.6 % Final    Comment:    QUESTIONABLE IDENTIFICATION / INCORRECTLY LABELED SPECIMEN CALLED TO J.LYON,RN 0527 08/09/2020 M.CAMPBELL (NOTE) Pre diabetes:          5.7%-6.4%  Diabetes:              >6.4%  Glycemic control for   <7.0% adults with diabetes     CBG: Recent Labs  Lab 10/27/20 1833 10/28/20 0856 10/28/20 1324 10/28/20 1658 10/28/20 2126  GLUCAP 241* 162* 195* 201* 178*    Review of Systems:   Pertinent positive and negatives per HPI otherwise ROS negative.   Past Medical History:  She,  has a past medical history of Anemia, Arthritis, Asthma, CHF (congestive heart failure) ([place]), Diabetes mellitus with complication ([place]), End stage renal disease on dialysis ([place]), Gangrene ([place]), Hyperlipidemia, and Hypertension.   Surgical History:   Past Surgical History:  Procedure Laterality Date    LEFT BELOW KNEE AMPUTATION (Left Knee)  06/08/2020   A/V FISTULAGRAM Right 02/04/2017   Procedure: A/V Fistulagram;  Surgeon: Waynetta Sandy, MD;  Location: [facility];  Service: Cardiovascular;  Laterality: Right;   ABDOMINAL AORTOGRAM W/LOWER EXTREMITY N/A 11/18/2019   Procedure: ABDOMINAL AORTOGRAM W/LOWER EXTREMITY;  Surgeon: [name], MD;  Location: [facility];  Service: Cardiovascular;  Laterality: N/A;   ABDOMINAL AORTOGRAM W/LOWER EXTREMITY  N/A 02/23/2020   Procedure: ABDOMINAL AORTOGRAM W/LOWER EXTREMITY;  Surgeon: [name], MD;  Location: [facility];  Service: Cardiovascular;  Laterality: N/A;  Bilateral   ABDOMINAL AORTOGRAM W/LOWER EXTREMITY N/A 08/10/2020   Procedure: ABDOMINAL AORTOGRAM W/LOWER EXTREMITY;  Surgeon: [name], MD;  Location: [facility];  Service: Cardiovascular;  Laterality: N/A;   AMPUTATION Left 06/08/2020   Procedure: LEFT BELOW KNEE AMPUTATION;  Surgeon: Cherre Robins, MD;  Location: [place] [facility];  Service: Vascular;  Laterality: Left;   AMPUTATION Right 09/14/2020   Procedure: RIGHT BELOW KNEE AMPUTATION;  Surgeon: Waynetta Sandy, MD;  Location: [place];  Service: Vascular;  Laterality: Right;   AV FISTULA PLACEMENT Right 08/25/2014   Procedure: RIGHT BRACHIOCEPHALIC  ARTERIOVENOUS (AV) FISTULA CREATION;  Surgeon: Mal Misty, MD;  Location: [place];  Service: Vascular;  Laterality: Right;   AV FISTULA PLACEMENT Right 03/18/2019   Procedure: INSERTION OF ARTERIOVENOUS (AV) GORE-TEX GRAFT RIGHT ARM;  Surgeon: Serafina Mitchell, MD;  Location: [place];  Service: Vascular;  Laterality: Right;   [place] Right 12/24/2018   Procedure: FIRST STAGE BASILIC VEIN TRANSPOSITION RIGHT UPPER ARM;  Surgeon: Serafina Mitchell, MD;  Location: [place];  Service: Vascular;  Laterality: Right;   FISTULA SUPERFICIALIZATION Right 11/03/2014   Procedure: RIGHT UPPER ARM FISTULA SUPERFICIALIZATION;  Surgeon: Mal Misty, MD;  Location: [place];  Service: Vascular;  Laterality: Right;   FISTULOGRAM Right 01/16/2016   Procedure: RIGHT UPPER EXTREMITY ARTERIOVENOUS FISTULOGRAM WITH BALLOON ANGIOPLASTY;  Surgeon: Vickie Epley, MD;  Location: [facility];  Service: Vascular;  Laterality: Right;   INSERTION OF DIALYSIS CATHETER     X4   IR FLUORO GUIDE CV LINE RIGHT  12/03/2018   IR REMOVAL TUN CV CATH W/O FL  07/06/2019   IR THROMBECTOMY AV FISTULA W/THROMBOLYSIS/PTA INC/SHUNT/IMG RIGHT  Right 12/03/2018   IR THROMBECTOMY AV FISTULA W/THROMBOLYSIS/PTA/STENT INC/SHUNT/IMG RT Right 10/11/2019   IR US GUIDE VASC ACCESS RIGHT  12/03/2018   IR US GUIDE VASC ACCESS RIGHT  12/03/2018   IR US GUIDE VASC ACCESS RIGHT  10/14/2019   LIGATION OF COMPETING BRANCHES OF ARTERIOVENOUS FISTULA Right 11/03/2014   Procedure: LIGATION OF COMPETING BRANCHE x1 OF RIGHT UPPER ARM ARTERIOVENOUS FISTULA;  Surgeon: Mal Misty, MD;  Location: [place];  Service: Vascular;  Laterality: Right;   PERIPHERAL VASCULAR BALLOON ANGIOPLASTY Right 02/04/2017   Procedure: PERIPHERAL VASCULAR BALLOON ANGIOPLASTY;  Surgeon: Waynetta Sandy, MD;  Location: [facility];  Service: Cardiovascular;  Laterality: Right;   PERIPHERAL VASCULAR BALLOON ANGIOPLASTY Right 11/18/2019   Procedure: PERIPHERAL VASCULAR BALLOON ANGIOPLASTY;  Surgeon: [name], MD;  Location: [facility];  Service: Cardiovascular;  Laterality: Right;  anterior tibial   PERIPHERAL VASCULAR BALLOON ANGIOPLASTY  02/23/2020   Procedure: PERIPHERAL VASCULAR BALLOON ANGIOPLASTY;  Surgeon: [name], MD;  Location: [facility];  Service: Cardiovascular;;  Lt  AT   PERIPHERAL VASCULAR INTERVENTION Right 08/10/2020   Procedure: PERIPHERAL VASCULAR INTERVENTION;  Surgeon: [name], MD;  Location: [facility];  Service: Cardiovascular;  Laterality: Right;   TRANSMETATARSAL AMPUTATION Left 02/24/2020   Procedure: TRANSMETATARSAL AMPUTATION LEFT;  Surgeon: Cherre Robins, MD;  Location: [facility];  Service: Vascular;  Laterality: Left;   TRANSMETATARSAL AMPUTATION Right 08/11/2020   Procedure: RIGHT TRANSMETATARSAL AMPUTATION;  Surgeon: Waynetta Sandy, MD;  Location: [facility];  Service: Vascular;  Laterality: Right;   TUBAL LIGATION       Social History:   reports that she quit smoking about 36 years ago. Her smoking use included cigarettes. She has never used smokeless tobacco. She reports that  she does not drink alcohol and does not use drugs.   Family History:  Her family history includes Cancer in her father; Diabetes in her daughter, father, mother, and son; Heart disease in her daughter and son; Hypertension in her daughter, mother, sister, and son; Peripheral vascular disease in her son.   Allergies Allergies  Allergen Reactions   Sulfa Antibiotics Swelling     Home Medications  Prior to Admission medications   Medication Sig Start Date End Date Taking? Authorizing Provider  acetaminophen (TYLENOL) 325 MG tablet Take 1-2 tablets (325-650  mg total) by mouth every 4 (four) hours as needed for mild pain. 07/04/20  Yes Love, Ivan Anchors, PA-C  amLODipine (NORVASC) 5 MG tablet TAKE ONE TABLET BY MOUTH AT BEDTIME Patient taking differently: Take 5 mg by mouth daily. 09/06/20  Yes Claretta Fraise, MD  aspirin EC 81 MG tablet Take 81 mg by mouth at bedtime. Swallow whole.   Yes [provider]  atorvastatin (LIPITOR) 40 MG tablet TAKE ONE TABLET BY MOUTH DAILY Patient taking differently: at bedtime. 08/21/20  Yes Claretta Fraise, MD  clopidogrel (PLAVIX) 75 MG tablet Take 1 tablet (75 mg total) by mouth daily. Patient taking differently: Take 75 mg by mouth every evening. 11/18/19 11/17/20 Yes [name], MD  diphenhydrAMINE (BENADRYL) 25 MG tablet Take 25 mg by mouth every 6 (six) hours as needed for itching.   Yes [provider]  gabapentin (NEURONTIN) 300 MG capsule Take 1 capsule (300 mg total) by mouth at bedtime. 07/27/20  Yes Bayard Hugger, NP  insulin detemir (LEVEMIR) 100 UNIT/ML injection Inject 0.16 mLs (16 Units total) into the skin daily. 08/31/20  Yes [name], [name], MD  metoprolol succinate (TOPROL-XL) 50 MG 24 hr tablet TAKE ONE TABLET BY MOUTH DAILY. TAKE WITH OR IMMEDIATELY FOLLOWING A MEAL Patient taking differently: Take 50 mg by mouth at bedtime. 09/06/20  Yes Claretta Fraise, MD  oxyCODONE (OXY IR/ROXICODONE) 5 MG immediate release tablet Take  1 tablet (5 mg total) by mouth every 6 (six) hours as needed for severe pain. 09/18/20  Yes Elgergawy, Silver Huguenin, MD  senna-docusate (SENOKOT-S) 8.6-50 MG tablet Take 1 tablet by mouth 2 (two) times daily. Patient taking differently: Take 1 tablet by mouth 2 (two) times daily as needed for mild constipation. 07/04/20  Yes Love, Ivan Anchors, PA-C  sevelamer carbonate (RENVELA) 800 MG tablet Take 1,600-2,400 mg by mouth See admin instructions. Take 2400 mg with each meal and 1600 mg with each snack   Yes [provider]  TRULICITY 1.5 0000000 SOPN INJECT CONTENTs OF ONE PEN UNDER THE SKIN WEEKLY ON SATURDAY Patient taking differently: Inject 1.5 mg into the skin every Saturday. 08/24/20  Yes Claretta Fraise, MD  Insulin Syringe-Needle U-100 28G X 1/2" 1 ML MISC Use with insulin daily Dx E11.22 08/31/20   Claretta Fraise, MD  multivitamin (RENA-VIT) TABS tablet Take 1 tablet by mouth at bedtime.  Patient not taking: No sig reported    [provider]  polyethylene glycol (MIRALAX / GLYCOLAX) 17 g packet Take 17 g by mouth daily. Patient not taking: No sig reported 06/17/20   Regalado, Jerald Kief A, MD     Critical care time: 55 minutes

## 2020-10-29 NOTE — Progress Notes (Signed)
eLink Physician-Brief Progress Note Patient Name: Christy Zhang DOB: [birth year] MRN: [id number]   Date of Service  10/29/2020  HPI/Events of Note  Hyperglycemia - Patient is intubated and ventilated. Currently  on Levemir + AC Novolog SSI.  eICU Interventions  Plan: Will change to Q 4 hour very sensitive Novolog SSI.     Intervention Category Major Interventions: Hyperglycemia - active titration of insulin therapy  Lysle Dingwall 10/29/2020, 8:46 PM

## 2020-10-29 NOTE — Progress Notes (Addendum)
Ms. Steenhoek, 65 yr old with ESRD on HD and PAF is lethargic and on NRB stauration 98%.   Vitals:   10/28/20 2256 10/29/20 0006  BP: (!) 94/52 (!) 85/61  Pulse: (!) 133 (!) 138  Resp: (!) 22 17  Temp: 98.2 F (36.8 C) 98.3 F (36.8 C)  SpO2: 96% 98%   K:3.9.  EKG: Aflutter with 2:1 AV conduction.  She is tachycardic and became hypotensive with metoprolol and plan isto cardiovert the patient. I explained to the patient for a verbal consent. Witnessed by Marcelyn Ditty (RN). Will plan for anesthsia to give sedation prior to cardioversion. Conveyed to the hospitalist that patient needs to be on anticoagulation for at least a month prior. Ideally she need to be on anticoagulation for life based on her CHADS2VAsc score.  Patient agreed for the cardioversion verbally.  Repeat SBP of 105-111 mmHg. Still in [place]. Given the patient is stable, will abort emergency cardioversion in this non anticoagulated patient.

## 2020-10-29 NOTE — Progress Notes (Signed)
NAME:  Christy Zhang, MRN:  [id number], DOB:  11/22/1955, LOS: 2 ADMISSION DATE:  10/27/2020, CONSULTATION DATE:  10/29/20 REFERRING MD: Dr. Tyrell Antonio , CHIEF COMPLAINT:  Hypotension, A flutter, Volume overload  History of Present Illness:  This is a 65 yo female with history of BKA, ESRD (HD MWF), T2DM, HTN, Thrombocytosis, PVD, and HLD, admitted for hypoxic respiratory failure 2/2 PNA. Patient was noted to be confused in the context of 2 weeks of SOB and dry cough. Patient was noted to be weak after dialysis on 10/27/20. Continue to be more confused and weak and thus EMS was called. Patient brought to the ED and workup notable for LA of 2.9 and borderline hypotnesion. Pateint admitted for likely pna.   Patietn was on the floor and seemed to be doing fairly well until she became tachycardic on eve of 10/28/20. EKG noted A flutter. Also patient noted to be hypotnesive and thus cardiology considered cardioversion. Pateint received a 500 cc bolus and pressure improved and thus cardioversion aborted. CXR noable for enlraged pleura effusions and sign of increasing overload. VBG notable for PH of 7.24 and CO2 of 62.4.   Pertinent  Medical History  CHF CKD T2DM ESRD HLD HTN  Significant Hospital Events: Including procedures, antibiotic start and stop dates in addition to other pertinent events   Admitted to ICU on 10/29/20 for CRRT  Interim History / Subjective:  Weaning pressors. Patient will awaken and bang side rails. Removing -75 UF with CRRT.   Objective   Blood pressure (!) 110/50, pulse 62, temperature 98.1 F (36.7 C), temperature source Oral, resp. rate (!) 8, height '5\' 4"'$  (1.626 m), weight 96.6 kg, SpO2 100 %.    Vent Mode: PRVC FiO2 (%):  [40 %-100 %] 40 % Set Rate:  [20 bmp-24 bmp] 24 bmp Vt Set:  [440 mL] 440 mL PEEP:  [5 cmH20-7 cmH20] 5 cmH20 Plateau Pressure:  [23 cmH20-26 cmH20] 24 cmH20   Intake/Output Summary (Last 24 hours) at 10/29/2020 1230 Last data filed at 10/29/2020  1100 Gross per 24 hour  Intake 1292.72 ml  Output 438 ml  Net 854.72 ml    Filed Weights   10/28/20 0735 10/28/20 1457 10/29/20 0337  Weight: 94 kg 89.7 kg 96.6 kg    Examination: General: Obese sedated on mechanical ventilation. HENT: Pupils equally round Lungs: Normal vesicular breath sounds.  Cardiovascular: HS normal.  Abdomen: Soft non tender and non distended Extremities: amputations noted bilaterally.  Neuro: No focal deficits appreciated GU: Deferred  Resolved Hospital Problem list   NA  Assessment & Plan:  This is a 65 yo female with history as noted above for who critical care was consulted for hypotension, A flutter, and volume overload  Critically ill due to hypercapnic respiratory failure due to volume overload requiring mechanical ventilation and CRRT.  Critically ill due to distributive shock requiring norepinephrine.  ESRD A-Flutter-2:1 block now converted PNA with Pleural effusions-in the setting of pna and worsening respiratory failure most consider parapneumonic effusions DM2-  Plan:  - Continue full ventilatory support - SBT once 1-2L removed.  - CRRT for fluid removal  - Continue to support BP with NE - Will hold on CT chest until fluid removed and patient extubated.   Best Practice (right click and "Reselect all SmartList Selections" daily)   Diet/type: NPO tube feeds.  DVT prophylaxis: systemic heparin GI prophylaxis: N/A Lines: Dialysis Catheter Foley:  N/A Code Status:  full code Last date of multidisciplinary goals of care  discussion  Labs   CBC: Recent Labs  Lab 10/27/20 1851 10/28/20 0358 10/29/20 0443 10/29/20 0545  WBC 18.7* 17.5*  --   --   NEUTROABS 15.7*  --   --   --   HGB 9.5* 8.8* 10.5* 10.2*  HCT 32.0* 29.4* 31.0* 30.0*  MCV 81.8 82.4  --   --   PLT 736* 649*  --   --      Basic Metabolic Panel: Recent Labs  Lab 10/27/20 1851 10/28/20 0358 10/29/20 0443 10/29/20 0545  NA 130* 131* 127* 127*  K 4.0 3.9 4.6  4.8  CL 90* 95*  --   --   CO2 25 25  --   --   GLUCOSE 230* 207*  --   --   BUN 25* 25*  --   --   CREATININE 6.44* 6.31*  --   --   CALCIUM 8.3* 7.6*  --   --   MG  --  1.6*  --   --   PHOS  --  5.7*  --   --     GFR: Estimated Creatinine Clearance: 10.3 mL/min (A) (by C-G formula based on SCr of 6.31 mg/dL (H)). Recent Labs  Lab 10/27/20 1851 10/27/20 1927 10/27/20 2150 10/28/20 0133 10/28/20 0358  PROCALCITON  --   --   --   --  4.20  WBC 18.7*  --   --   --  17.5*  LATICACIDVEN  --  2.9* 2.0* 2.0* 1.3     Liver Function Tests: Recent Labs  Lab 10/27/20 1851 10/28/20 0358  AST 30 37  ALT 18 23  ALKPHOS 86 63  BILITOT 0.5 0.5  PROT 7.6 6.7  ALBUMIN 2.8* 2.5*    No results for input(s): LIPASE, AMYLASE in the last 168 hours. No results for input(s): AMMONIA in the last 168 hours.  ABG    Component Value Date/Time   PHART 7.386 10/29/2020 0545   PCO2ART 42.9 10/29/2020 0545   PO2ART 117 (H) 10/29/2020 0545   HCO3 25.8 10/29/2020 0545   TCO2 27 10/29/2020 0545   ACIDBASEDEF 0.5 10/29/2020 0125   O2SAT 99.0 10/29/2020 0545      Coagulation Profile: Recent Labs  Lab 10/27/20 1851 10/28/20 0358  INR 1.5* 1.5*     Cardiac Enzymes: No results for input(s): CKTOTAL, CKMB, CKMBINDEX, TROPONINI in the last 168 hours.  HbA1C: HB A1C (BAYER DCA - WAIVED)  Date/Time Value Ref Range Status  04/06/2020 11:05 AM 5.4 <7.0 % Final    Comment:                                          Diabetic Adult            <7.0                                       Healthy Adult        4.3 - 5.7                                                           (  DCCT/NGSP) American Diabetes Association's Summary of Glycemic Recommendations for Adults with Diabetes: Hemoglobin A1c <7.0%. More stringent glycemic goals (A1c <6.0%) may further reduce complications at the cost of increased risk of hypoglycemia.   01/06/2020 10:13 AM 6.2 <7.0 % Final    Comment:                                           Diabetic Adult            <7.0                                       Healthy Adult        4.3 - 5.7                                                           (DCCT/NGSP) American Diabetes Association's Summary of Glycemic Recommendations for Adults with Diabetes: Hemoglobin A1c <7.0%. More stringent glycemic goals (A1c <6.0%) may further reduce complications at the cost of increased risk of hypoglycemia.    Hgb A1c MFr Bld  Date/Time Value Ref Range Status  08/09/2020 05:23 AM 6.5 (H) 4.8 - 5.6 % Final    Comment:    (NOTE) Pre diabetes:          5.7%-6.4%  Diabetes:              >6.4%  Glycemic control for   <7.0% adults with diabetes   08/09/2020 03:48 AM 8.8 (H) 4.8 - 5.6 % Final    Comment:    QUESTIONABLE IDENTIFICATION / INCORRECTLY LABELED SPECIMEN CALLED TO J.LYON,RN 0527 08/09/2020 M.CAMPBELL (NOTE) Pre diabetes:          5.7%-6.4%  Diabetes:              >6.4%  Glycemic control for   <7.0% adults with diabetes     CBG: Recent Labs  Lab 10/28/20 1324 10/28/20 1658 10/28/20 2126 10/29/20 0500 10/29/20 1132  GLUCAP 195* 201* 178* 264* 230*    CRITICAL CARE Performed by: Kipp Brood   Total critical care time: 40 minutes  Critical care time was exclusive of separately billable procedures and treating other patients.  Critical care was necessary to treat or prevent imminent or life-threatening deterioration.  Critical care was time spent personally by me on the following activities: development of treatment plan with patient and/or surrogate as well as nursing, discussions with consultants, evaluation of patient's response to treatment, examination of patient, obtaining history from patient or surrogate, ordering and performing treatments and interventions, ordering and review of laboratory studies, ordering and review of radiographic studies, pulse oximetry, re-evaluation of patient's condition and participation in  multidisciplinary rounds.  Kipp Brood, MD [facility] ICU Physician [place]  Pager: [phone] Mobile: [phone] After hours: [phone].

## 2020-10-29 NOTE — Progress Notes (Signed)
Code Blue Event: [id number]  Pt agitated, RN connecting syringe to push prn Versed when patient became unresponsive and desatted to 20's. Attempted to find radial or femoral pulse, unable to palpate. Code blue called and CPR initiated. Rhythm PEA arrest. Estimated down time before compressions: 20-30 seconds. 2 minutes of CPR completed, pause to pulse check, ROSC. 1 stick Epi given during compressions, no shock.   MD Agarwala, RT, pharm, RN, phlebotomy present.   Christy Dufresne RN

## 2020-10-29 NOTE — Progress Notes (Signed)
Pt transported from 6E30 to 2H09 with primary RN and Rapid response RN without incidence.

## 2020-10-29 NOTE — Procedures (Signed)
Intubation Procedure Note  FORESTINE SECKMAN  [id number]  [birth year]  Date:10/29/20  Time:4:37 AM   Provider Performing:Alexandros Ewan E Orpah Melter    Procedure: Intubation (31500)  Indication(s) Respiratory Failure  Consent Risks of the procedure as well as the alternatives and risks of each were explained to the patient and/or caregiver.  Consent for the procedure was obtained and is signed in the bedside chart   Anesthesia Etomidate and Rocuronium   Time Out Verified patient identification, verified procedure, site/side was marked, verified correct patient position, special equipment/implants available, medications/allergies/relevant history reviewed, required imaging and test results available.   Sterile Technique Usual hand hygeine, masks, and gloves were used   Procedure Description Patient positioned in bed supine.  Sedation given as noted above.  Patient was intubated with endotracheal tube using Glidescope.  View was Grade 1 full glottis .  Number of attempts was 1.  Colorimetric CO2 detector was consistent with tracheal placement.   Complications/Tolerance None; patient tolerated the procedure well. Chest X-ray is ordered to verify placement.   EBL Minimal   Specimen(s) None

## 2020-10-29 NOTE — Progress Notes (Signed)
eLink Physician-Brief Progress Note Patient Name: Christy Zhang DOB: 1956-03-09 MRN: [id number]   Date of Service  10/29/2020  HPI/Events of Note  Notified of need for stress ulcer prophylaxis now that patient intubated and ventilated.   eICU Interventions  Plan: Protonix IV.     Intervention Category Intermediate Interventions: Best-practice therapies (e.g. DVT, beta blocker, etc.)  Wandell Scullion Eugene 10/29/2020, 6:28 AM

## 2020-10-29 NOTE — Progress Notes (Signed)
[facility] Progress Note Patient Name: Christy Zhang DOB: 1955/11/18 MRN: [id number]   Date of Service  10/29/2020  HPI/Events of Note  Agitation - Nursing request for bilateral soft wrist restraints.   eICU Interventions  Will order bilateral soft wrist restraints X 3 hours.     Intervention Category Major Interventions: Delirium, psychosis, severe agitation - evaluation and management  Esther Bradstreet Eugene 10/29/2020, 6:06 AM

## 2020-10-29 NOTE — Progress Notes (Signed)
ANTICOAGULATION CONSULT NOTE  Pharmacy Consult for Heparin  Indication: atrial fibrillation  Allergies  Allergen Reactions   Sulfa Antibiotics Swelling    Patient Measurements: Height: '5\' 4"'$  (162.6 cm) Weight: 96.6 kg (212 lb 15.4 oz) IBW/kg (Calculated) : 54.7   Vital Signs: Temp Source: Axillary (07/03 0730) BP: 104/22 (07/03 1415) Pulse Rate: 65 (07/03 1415)  Labs: Recent Labs    10/27/20 1851 10/27/20 2223 10/28/20 0130 10/28/20 0358 10/28/20 0552 10/29/20 0443 10/29/20 0545 10/29/20 1454  HGB 9.5*  --   --  8.8*  --  10.5* 10.2*  --   HCT 32.0*  --   --  29.4*  --  31.0* 30.0*  --   PLT 736*  --   --  649*  --   --   --   --   APTT 31  --   --  33  --   --   --   --   LABPROT 18.3*  --   --  17.9*  --   --   --   --   INR 1.5*  --   --  1.5*  --   --   --   --   HEPARINUNFRC  --   --   --   --   --   --   --  0.29*  CREATININE 6.44*  --   --  6.31*  --   --   --   --   TROPONINIHS  --    < > 82* 109* 107*  --   --   --    < > = values in this interval not displayed.     Estimated Creatinine Clearance: 10.3 mL/min (A) (by C-G formula based on SCr of 6.31 mg/dL (H)).   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 ESRD on HD now starting CRRT. She is also noted to be in afib/flutter and beginning IV heparin per pharmacy.  Underwent brief PEA arrest this afternoon. Heparin level came back slightly subtherapeutic at 0.29, on 1100 units/hr. Hgb 10.2, no s/sx of bleeding or infusion issues.   Goal of Therapy:  Heparin level 0.3-0.7 units/ml Monitor platelets by anticoagulation protocol: Yes    Plan:  Increase heparin drip to 1200 units/hr to keep in goal range Daily CBC/Heparin level Monitor for bleeding  Antonietta Jewel, PharmD, [place] Pharmacist  Phone: [phone] 10/29/2020 3:51 PM  Please check AMION for all [place] phone numbers After 10:00 PM, call [place] [phone]

## 2020-10-29 NOTE — Significant Event (Signed)
Rapid Response Event Note   Reason for Call : Request for assistance with possible bedside cardioversion Initial Focused Assessment:  I was notified by the nursing staff for Ms. Randon regarding a possible bedside cardioversion for Aflutter with hypotension (SBP 70s). Anesthesia was notified and then the plan was aborted because she responded to a 250cc NS bolus making her BP 114/76 (88) and 105/62 (75).  Ms. Iandoli is arousable and appropriate however quickly becomes sleepy when not stimulated. Pt still hypercarbic (7.24/62/83/26) while on BIPAP 13/5 and 50% FIO2. BBS Clear and diminished in bases. Pt appears to be ventilating well and not in distress while on BIPAP. PCCM consulted and Nephrology at bedside. Plan is to transfer to ICU and do bedside HD in ICU.   0145-98.3 F, 137 aflutter, 123/56 (76), RR 16 with sats 97% on BIPAP 13/5 and 50% Fio2.   Interventions:  -Transfer to ICU [facility])   MD Notified: Dr. Nevada Crane [facility]) and Dr. Juliane Lack (Cards) at bedside on my arrival Call Time: 0045 Arrival Time: 0050 End Time: [place]  Madelynn Done, RN

## 2020-10-29 NOTE — Progress Notes (Signed)
This RN was notified that pt's O2 saturation was sustaining in the 40's. Pt had removed nasal cannula. Non-rebreather mask applied at 15L, rapid response nurse consulted, respiratory therapist consulted. Nevada Crane, MD made aware; see new orders. Will continue to monitor.   Elaina Hoops, RN

## 2020-10-29 NOTE — Progress Notes (Signed)
Patient not seen. Events from last night Notice. CCM will take over patient care, Discussed with Dr Lynetta Mare.

## 2020-10-29 NOTE — Progress Notes (Signed)
[facility] NEPHROLOGY PROGRESS NOTE  Assessment/ Plan: Pt is a 65 y.o. yo female  with history of HTN, HLD, peripheral vascular disease status post bilateral BKA, DM, anemia, ESRD on HD MWF at [facility] presented with cough, shortness of breath and generalized weakness, seen as a consultation for the management of dialysis.  Dialysis Orders from previous admission:  [facility] - MWF 4 hrs 400/500 97 kg 2.0K/2.5 Ca 16g needles -Heparin 1000 units initial bolus-heparin pump 1000 units/hr stop 1 hour prior to end of tx  -Epogen 14000 units IV TIW -Hectorol 1.5 mcg IV TIW  #Septic shock due to left lung pneumonia: Patient was transferred to [facility] yesterday.  She required mechanical ventilation.  Currently on vancomycin and cefepime.  Maintaining blood pressure with Levophed.  #Vent dependent respiratory failure due to both pneumonia and pulm edema.  Antibiotics as above and volume management with CRRT.  # ESRD MWF schedule.  Now on CRRT because of hemodynamic instability.  Tolerating CRRT well.  On IV heparin for anticoagulation.  UF as tolerated.  Discussed with the nurse.  #A. fib with RVR: Currently on amiodarone and heparin.  Cardiology is following.  Checking echocardiogram because of elevated troponin level.  # Anemia: Hemoglobin 10.2 today.  Continue monitor.  Resume ESA.  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  Resume sevelamer when able to take orally.  #Hyponatremia, hypervolemic: Managed with CRRT.  Subjective: Seen and examined in ICU.  She is intubated, sedated and on CRRT.  Requiring Levophed with acceptable BP reading.  Discussed with ICU nurse. Objective Vital signs in last 24 hours: Vitals:   10/29/20 0600 10/29/20 0700 10/29/20 0745 10/29/20 0800  BP: (!) 126/44 (!) 120/27    Pulse: 67 64 (!) 124   Resp: (!) 24 (!) 24 (!) 24 18  Temp:      TempSrc:      SpO2: 100% 100% 100%   Weight:      Height:       Weight change:   Intake/Output  Summary (Last 24 hours) at 10/29/2020 0846 Last data filed at 10/29/2020 0700 Gross per 24 hour  Intake 782.4 ml  Output 110 ml  Net 672.4 ml       Labs: Basic Metabolic Panel: Recent Labs  Lab 10/27/20 1851 10/28/20 0358 10/29/20 0443 10/29/20 0545  NA 130* 131* 127* 127*  K 4.0 3.9 4.6 4.8  CL 90* 95*  --   --   CO2 25 25  --   --   GLUCOSE 230* 207*  --   --   BUN 25* 25*  --   --   CREATININE 6.44* 6.31*  --   --   CALCIUM 8.3* 7.6*  --   --   PHOS  --  5.7*  --   --    Liver Function Tests: Recent Labs  Lab 10/27/20 1851 10/28/20 0358  AST 30 37  ALT 18 23  ALKPHOS 86 63  BILITOT 0.5 0.5  PROT 7.6 6.7  ALBUMIN 2.8* 2.5*   No results for input(s): LIPASE, AMYLASE in the last 168 hours. No results for input(s): AMMONIA in the last 168 hours. CBC: Recent Labs  Lab 10/27/20 1851 10/28/20 0358 10/29/20 0443 10/29/20 0545  WBC 18.7* 17.5*  --   --   NEUTROABS 15.7*  --   --   --   HGB 9.5* 8.8* 10.5* 10.2*  HCT 32.0* 29.4* 31.0* 30.0*  MCV 81.8 82.4  --   --  PLT 736* 649*  --   --    Cardiac Enzymes: No results for input(s): CKTOTAL, CKMB, CKMBINDEX, TROPONINI in the last 168 hours. CBG: Recent Labs  Lab 10/28/20 0856 10/28/20 1324 10/28/20 1658 10/28/20 2126 10/29/20 0500  GLUCAP 162* 195* 201* 178* 264*    Iron Studies: No results for input(s): IRON, TIBC, TRANSFERRIN, FERRITIN in the last 72 hours. Studies/Results: DG Chest Port 1 View  Result Date: 10/29/2020 CLINICAL DATA:  65 year old female with hypoxia.  Intubated. EXAM: PORTABLE CHEST 1 VIEW COMPARISON:  10/28/2020 and earlier. FINDINGS: Portable AP semi upright view at 0446 hours. Endotracheal tube in good position projecting over the airway between the clavicles and carina. A left chest approach vascular catheter projects at the cavoatrial junction region. Mildly improved lung volumes and decreased but not resolved veiling opacity in the left hemithorax with continued obscuration of the  left hemidiaphragm. Dense retrocardiac opacity. Probable cardiomegaly. Improved right lung base ventilation. No pneumothorax. Paucity of bowel gas in the upper abdomen. IMPRESSION: 1. Endotracheal tube in good position. Left chest approach central line tip in good position near the cavoatrial junction. 2. Mildly improved lung volumes and ventilation. Continued left greater than right pleural effusions, with left lower lobe collapse or consolidation. Electronically Signed   By: Genevie Ann M.D.   On: 10/29/2020 05:13   DG CHEST PORT 1 VIEW  Result Date: 10/28/2020 CLINICAL DATA:  Hypoxemia. EXAM: PORTABLE CHEST 1 VIEW COMPARISON:  Radiograph yesterday. FINDINGS: Cardiomegaly. Worsening pleural effusions. Retrocardiac opacity is partially obscured by pleural fluid on the current exam. Lower lung volumes likely due to increasing compressive atelectasis. Increasing vascular congestion. Vascular stent in the region of the right subclavian. No pneumothorax. IMPRESSION: 1. Worsening pleural effusions and vascular congestion. Left pleural effusion obscures retrocardiac opacity. 2. Lower lung volumes likely due to increasing compressive atelectasis. 3. Cardiomegaly. Electronically Signed   By: Keith Rake M.D.   On: 10/28/2020 22:32   DG Chest Port 1 View  Result Date: 10/27/2020 CLINICAL DATA:  Confused after dialysis, history of asthma EXAM: PORTABLE CHEST 1 VIEW COMPARISON:  06/22/2020 FINDINGS: Dense retrocardiac and left mid lung opacity partially silhouetting portion of the left heart border. Could reflect a combination of volume loss and airspace consolidation in the lingula. Remaining portions the lungs are otherwise clear aside from basilar atelectasis. Visible portions of the cardiac silhouette suggests some mild cardiomegaly accounting for portable technique. The aorta is calcified. The remaining cardiomediastinal contours are unremarkable. Vascular stenting noted in the right subclavian to axillary  vessels. Telemetry leads overlie the chest. Degenerative changes are present in the imaged spine and shoulders. IMPRESSION: Dense retrocardiac and left mid lung opacity partially silhouetting the left heart border. Could reflect acute consolidation or pneumonia with volume loss in the lingula. Cardiomegaly Aortic Atherosclerosis (ICD10-I70.0). Electronically Signed   By: Lovena Le M.D.   On: 10/27/2020 19:46    Medications: Infusions:   prismasol BGK 4/2.5 400 mL/hr at 10/29/20 0539    prismasol BGK 4/2.5 200 mL/hr at 10/29/20 0539   amiodarone 60 mg/hr (10/29/20 0755)   Followed by   amiodarone     ceFEPime (MAXIPIME) IV     fentaNYL infusion INTRAVENOUS 200 mcg/hr (10/29/20 0700)   heparin 1,100 Units/hr (10/29/20 0655)   norepinephrine (LEVOPHED) Adult infusion 12 mcg/min (10/29/20 0700)   prismasol BGK 4/2.5 1,800 mL/hr at 10/29/20 0834   vancomycin      Scheduled Medications:  aspirin EC  81 mg Oral Q breakfast  atorvastatin  40 mg Oral Daily   Chlorhexidine Gluconate Cloth  6 each Topical Daily   clopidogrel  75 mg Oral Daily   feeding supplement (GLUCERNA SHAKE)  237 mL Oral TID BM   fentaNYL (SUBLIMAZE) injection  100 mcg Intravenous Once   fentaNYL (SUBLIMAZE) injection  50 mcg Intravenous Once   guaiFENesin  600 mg Oral BID   insulin aspart  0-6 Units Subcutaneous TID WC   insulin detemir  8 Units Subcutaneous QHS   ipratropium-albuterol  3 mL Nebulization TID   mouth rinse  15 mL Mouth Rinse BID   pantoprazole (PROTONIX) IV  40 mg Intravenous Q24H   sevelamer carbonate  2,400 mg Oral TID WC    have reviewed scheduled and prn medications.  Physical Exam: General: Critically ill looking female intubated, sedated Heart:RRR, s1s2 nl Lungs: Coarse breath sound bilateral, Abdomen:soft,  non-distended Extremities: Bilateral BKA Dialysis Access: Left IJ temporary HD catheter for CRRT.  AV fistula has good thrill and bruit.  Dalayah Deahl Prasad Rembert Browe 10/29/2020,8:46 AM   LOS: 2 days

## 2020-10-29 NOTE — Progress Notes (Signed)
Pharmacy Antibiotic Note  Christy Zhang is a 65 y.o. female admitted on 10/27/2020 with  unknown source .  Pharmacy has been consulted for vancomycin/cefepime dosing. Of note, patient is ESRD on HD. Usual schedule is MWF, and she did go to HD today, but unsure if full session was completed or not. Given 1000 mg vancomycin dose, will repeat for a total load of 2000 mg.  7/3 AM update:  Pt moved to ICU/intubated and starting CRRT Will adjust anti-biotic dosing for CRRT   Plan: Vancomycin 1000 mg IV q24h (already received load) Cefepime 2g IV q12h Change back to iHD dosing once CRRT stops  Height: '5\' 4"'$  (162.6 cm) Weight: 96.6 kg (212 lb 15.4 oz) IBW/kg (Calculated) : 54.7  Temp (24hrs), Avg:98.1 F (36.7 C), Min:97.4 F (36.3 C), Max:98.6 F (37 C)  Recent Labs  Lab 10/27/20 1851 10/27/20 1927 10/27/20 2150 10/28/20 0133 10/28/20 0358  WBC 18.7*  --   --   --  17.5*  CREATININE 6.44*  --   --   --  6.31*  LATICACIDVEN  --  2.9* 2.0* 2.0* 1.3     Estimated Creatinine Clearance: 10.3 mL/min (A) (by C-G formula based on SCr of 6.31 mg/dL (H)).    Allergies  Allergen Reactions   Sulfa Antibiotics Swelling    Narda Bonds, PharmD, BCPS Clinical Pharmacist Phone: [phone]

## 2020-10-29 NOTE — Procedures (Signed)
Central Venous Catheter Insertion Procedure Note  LEATA SITEK  [id number]  Mar 03, 1956  Date:10/29/20  Time:4:38 AM   Provider Performing:Francheska Villeda E Orpah Melter   Procedure: Insertion of Non-tunneled Central Venous [phone]) with US guidance [id number])   Indication(s) Hemodialysis  Consent Risks of the procedure as well as the alternatives and risks of each were explained to the patient and/or caregiver.  Consent for the procedure was obtained and is signed in the bedside chart  Anesthesia Topical only with 1% lidocaine   Timeout Verified patient identification, verified procedure, site/side was marked, verified correct patient position, special equipment/implants available, medications/allergies/relevant history reviewed, required imaging and test results available.  Sterile Technique Maximal sterile technique including full sterile barrier drape, hand hygiene, sterile gown, sterile gloves, mask, hair covering, sterile ultrasound probe cover (if used).  Procedure Description Area of catheter insertion was cleaned with chlorhexidine and draped in sterile fashion.  With real-time ultrasound guidance a HD catheter was placed into the left internal jugular vein. Nonpulsatile blood flow and easy flushing noted in all ports.  The catheter was sutured in place and sterile dressing applied.  Complications/Tolerance None; patient tolerated the procedure well. Chest X-ray is ordered to verify placement for internal jugular or subclavian cannulation.   Chest x-ray is not ordered for femoral cannulation.  EBL Minimal  Specimen(s) None

## 2020-10-29 NOTE — Anesthesia Preprocedure Evaluation (Deleted)
Anesthesia Evaluation    Airway        Dental   Pulmonary asthma , former smoker,           Cardiovascular hypertension, Pt. on medications and Pt. on home beta blockers + Peripheral Vascular Disease and +CHF       Neuro/Psych negative neurological ROS  negative psych ROS   GI/Hepatic negative GI ROS, Neg liver ROS,   Endo/Other  diabetes, Type 2, Insulin Dependent  Renal/GU ESRF and DialysisRenal disease  negative genitourinary   Musculoskeletal  (+) Arthritis ,   Abdominal   Peds  Hematology  (+) anemia ,   Anesthesia Other Findings   Reproductive/Obstetrics                             Anesthesia Physical Anesthesia Plan Anesthesia Quick Evaluation

## 2020-10-29 NOTE — Progress Notes (Signed)
Received a call from bedside RN regarding the patient becoming severely hypoxic with O2 sat in the 40s after taking off her oxygen supplementation.  She was placed on nonrebreather with improvement of her O2 saturation.  A chest x-ray was obtained which showed worsening pulmonary edema and bilateral pleural effusions.  Arterial blood gas revealed pH of 7.217/67.9/129 on nonrebreather.  Patient was placed on BiPAP.  Repeated ABG 7.242/62.4/83.2 on 50% FiO2.  Came to bedside patient is lethargic however she arouses to sternal rubs and follows commands but drifts back to sleep.  She is volume overload on exam.  Nephrology contacted for possible hemodialysis prior to her scheduled dialysis on Monday.  Seen by Dr. Jonnie Finner, due to her hemodynamic instability will need to be done in the ICU.  Tachycardic, EKG showed A-flutter rate of 136.  Received 1 dose of IV Lasix 2.5 mg with no improvement of her heart rate.  She became hypotensive with MAP of 60.  Was seen by cardiology Dr. Rodman Key.  Initially plan to cardiovert but aborted due to improvement in MAP.  PCCM, Dr. Duwayne Heck, has been consulted for possible admission to ICU where hemodialysis would be provided.

## 2020-10-29 NOTE — Progress Notes (Addendum)
   10/28/20 2111  Assess: MEWS Score  Temp 98.2 F (36.8 C)  BP 117/82  Pulse Rate (!) 140  ECG Heart Rate (!) 141  Resp (!) 22  Level of Consciousness Alert  SpO2 98 %  O2 Device Nasal Cannula  O2 Flow Rate (L/min) 3 L/min  Assess: MEWS Score  MEWS Temp 0  MEWS Systolic 0  MEWS Pulse 3  MEWS RR 1  MEWS LOC 0  MEWS Score 4  MEWS Score Color Red  Assess: if the MEWS score is Yellow or Red  Were vital signs taken at a resting state? Yes  Focused Assessment Change from prior assessment (see assessment flowsheet)  Early Detection of Sepsis Score *See Row Information* High  MEWS guidelines implemented *See Row Information* Yes  Treat  MEWS Interventions Administered scheduled meds/treatments;Escalated (See documentation below)  Take Vital Signs  Increase Vital Sign Frequency  Red: Q 1hr X 4 then Q 4hr X 4, if remains red, continue Q 4hrs  Escalate  MEWS: Escalate Red: discuss with charge nurse/RN and provider, consider discussing with RRT  Notify: Charge Nurse/RN  Name of Charge Nurse/RN Notified Deborah, RN  Date Charge Nurse/RN Notified 10/28/20  Time Charge Nurse/RN Notified 2122  Notify: Provider  Provider Name/Title Nevada Crane, MD  Date Provider Notified 10/28/20  Time Provider Notified 2130  Notification Type Page  Notification Reason Change in status  Provider response See new orders  Notify: Rapid Response  Name of Rapid Response RN Notified Courtney, RN  Date Rapid Response Notified 10/28/20  Time Rapid Response Notified 2130  Document  Patient Outcome Not stable and remains on department  Progress note created (see row info) Yes

## 2020-10-29 NOTE — Progress Notes (Signed)
Brief Nutrition Note  Consult received for enteral/tube feeding initiation and management.  Adult Enteral Nutrition Protocol initiated. Full assessment to follow. Pt with OG tube tip "overlying the level of the stomach" per abdominal x-ray today.  Admitting Dx: Altered mental status [R41.82] CAP (community acquired pneumonia) [J18.9] Community acquired pneumonia of left lower lobe of lung [J18.9]   Labs:  Recent Labs  Lab 10/27/20 1851 10/28/20 0358 10/29/20 0443 10/29/20 0545  NA 130* 131* 127* 127*  K 4.0 3.9 4.6 4.8  CL 90* 95*  --   --   CO2 25 25  --   --   BUN 25* 25*  --   --   CREATININE 6.44* 6.31*  --   --   CALCIUM 8.3* 7.6*  --   --   MG  --  1.6*  --   --   PHOS  --  5.7*  --   --   GLUCOSE 230* 207*  --   --     Gustavus Bryant, MS, RD, LDN Inpatient Clinical Dietitian Please see AMiON for contact information.

## 2020-10-30 DIAGNOSIS — J189 Pneumonia, unspecified organism: Secondary | ICD-10-CM | POA: Diagnosis not present

## 2020-10-30 DIAGNOSIS — J9601 Acute respiratory failure with hypoxia: Secondary | ICD-10-CM | POA: Diagnosis not present

## 2020-10-30 LAB — CBC
HCT: 28.7 % — ABNORMAL LOW (ref 36.0–46.0)
Hemoglobin: 8.6 g/dL — ABNORMAL LOW (ref 12.0–15.0)
MCH: 24.5 pg — ABNORMAL LOW (ref 26.0–34.0)
MCHC: 30 g/dL (ref 30.0–36.0)
MCV: 81.8 fL (ref 80.0–100.0)
Platelets: 556 10*3/uL — ABNORMAL HIGH (ref 150–400)
RBC: 3.51 MIL/uL — ABNORMAL LOW (ref 3.87–5.11)
RDW: 19.6 % — ABNORMAL HIGH (ref 11.5–15.5)
WBC: 27.4 10*3/uL — ABNORMAL HIGH (ref 4.0–10.5)
nRBC: 2.4 % — ABNORMAL HIGH (ref 0.0–0.2)

## 2020-10-30 LAB — URINE CULTURE

## 2020-10-30 LAB — RENAL FUNCTION PANEL
Albumin: 2.2 g/dL — ABNORMAL LOW (ref 3.5–5.0)
Albumin: 2.1 g/dL — ABNORMAL LOW (ref 3.5–5.0)
Anion gap: 9 (ref 5–15)
Anion gap: 11 (ref 5–15)
BUN: 17 mg/dL (ref 8–23)
BUN: 16 mg/dL (ref 8–23)
CO2: 22 mmol/L (ref 22–32)
CO2: 22 mmol/L (ref 22–32)
Calcium: 7.9 mg/dL — ABNORMAL LOW (ref 8.9–10.3)
Calcium: 8 mg/dL — ABNORMAL LOW (ref 8.9–10.3)
Chloride: 100 mmol/L (ref 98–111)
Chloride: 100 mmol/L (ref 98–111)
Creatinine, Ser: 3.42 mg/dL — ABNORMAL HIGH (ref 0.44–1.00)
Creatinine, Ser: 2.64 mg/dL — ABNORMAL HIGH (ref 0.44–1.00)
GFR, Estimated: 14 mL/min — ABNORMAL LOW (ref >60)
GFR, Estimated: 20 mL/min — ABNORMAL LOW (ref >60)
Glucose, Bld: 199 mg/dL — ABNORMAL HIGH (ref 70–99)
Glucose, Bld: 198 mg/dL — ABNORMAL HIGH (ref 70–99)
Phosphorus: 2.9 mg/dL (ref 2.5–4.6)
Phosphorus: 2.1 mg/dL — ABNORMAL LOW (ref 2.5–4.6)
Potassium: 4.7 mmol/L (ref 3.5–5.1)
Potassium: 4.3 mmol/L (ref 3.5–5.1)
Sodium: 131 mmol/L — ABNORMAL LOW (ref 135–145)
Sodium: 133 mmol/L — ABNORMAL LOW (ref 135–145)

## 2020-10-30 LAB — APTT: aPTT: 104 seconds — ABNORMAL HIGH (ref 24–36)

## 2020-10-30 LAB — GLUCOSE, CAPILLARY
Glucose-Capillary: 156 mg/dL — ABNORMAL HIGH (ref 70–99)
Glucose-Capillary: 231 mg/dL — ABNORMAL HIGH (ref 70–99)
Glucose-Capillary: 230 mg/dL — ABNORMAL HIGH (ref 70–99)
Glucose-Capillary: 198 mg/dL — ABNORMAL HIGH (ref 70–99)
Glucose-Capillary: 204 mg/dL — ABNORMAL HIGH (ref 70–99)

## 2020-10-30 LAB — HEPARIN LEVEL (UNFRACTIONATED): Heparin Unfractionated: 0.28 IU/mL — ABNORMAL LOW (ref 0.30–0.70)

## 2020-10-30 LAB — MAGNESIUM
Magnesium: 2.2 mg/dL (ref 1.7–2.4)
Magnesium: 2.3 mg/dL (ref 1.7–2.4)

## 2020-10-30 LAB — TRIGLYCERIDES: Triglycerides: 108 mg/dL (ref <150)

## 2020-10-30 MED ORDER — B COMPLEX-C PO TABS
1.0000 | ORAL_TABLET | Freq: Every day | ORAL | Status: DC
  Administered 2020-10-30: 1 via ORAL
  Filled 2020-10-30: qty 1

## 2020-10-30 MED ORDER — INSULIN ASPART 100 UNIT/ML IJ SOLN
0.0000 [IU] | INTRAMUSCULAR | Status: DC
  Administered 2020-10-30 – 2020-10-31 (×3): 2 [IU] via SUBCUTANEOUS

## 2020-10-30 MED ORDER — B COMPLEX-C PO TABS
1.0000 | ORAL_TABLET | Freq: Every day | ORAL | Status: DC
  Administered 2020-10-31 – 2020-11-06 (×7): 1
  Filled 2020-10-30 (×7): qty 1

## 2020-10-30 MED ORDER — VITAL 1.5 CAL PO LIQD
1000.0000 mL | ORAL | Status: DC
  Administered 2020-10-30 – 2020-11-02 (×4): 1000 mL
  Filled 2020-10-30: qty 1000

## 2020-10-30 MED ORDER — VASOPRESSIN 20 UNITS/100 ML INFUSION FOR SHOCK
0.0000 [IU]/min | INTRAVENOUS | Status: DC
  Administered 2020-10-30 – 2020-11-04 (×12): 0.03 [IU]/min via INTRAVENOUS
  Filled 2020-10-30 (×12): qty 100

## 2020-10-30 MED ORDER — PROSOURCE TF PO LIQD
90.0000 mL | Freq: Three times a day (TID) | ORAL | Status: DC
  Administered 2020-10-30 – 2020-11-03 (×12): 90 mL
  Filled 2020-10-30 (×12): qty 90

## 2020-10-30 NOTE — Progress Notes (Signed)
NAME:  Christy Zhang, MRN:  [id number], DOB:  1955-06-04, LOS: 3 ADMISSION DATE:  10/27/2020, CONSULTATION DATE:  10/29/20 REFERRING MD: Dr. Tyrell Antonio , CHIEF COMPLAINT:  Hypotension, A flutter, Volume overload  History of Present Illness:  This is a 65 yo female with history of BKA, ESRD (HD MWF), T2DM, HTN, Thrombocytosis, PVD, and HLD, admitted for hypoxic respiratory failure 2/2 PNA. Patient was noted to be confused in the context of 2 weeks of SOB and dry cough. Patient was noted to be weak after dialysis on 10/27/20. Continue to be more confused and weak and thus EMS was called. Patient brought to the ED and workup notable for LA of 2.9 and borderline hypotnesion. Pateint admitted for likely pna.   Patietn was on the floor and seemed to be doing fairly well until she became tachycardic on eve of 10/28/20. EKG noted A flutter. Also patient noted to be hypotnesive and thus cardiology considered cardioversion. Pateint received a 500 cc bolus and pressure improved and thus cardioversion aborted. CXR noable for enlraged pleura effusions and sign of increasing overload. VBG notable for PH of 7.24 and CO2 of 62.4.   Pertinent  Medical History  CHF CKD T2DM ESRD HLD HTN  Significant Hospital Events: Including procedures, antibiotic start and stop dates in addition to other pertinent events   Admitted to ICU on 10/29/20 for CRRT 7/3 - brief PEA with bearing down while agitated.   Interim History / Subjective:   Increased vasopressor requirements with increased sedation to prevent further episodes of bearing down.  Remains tachycardic.  Objective   Blood pressure (!) 111/51, pulse 69, temperature 99 F (37.2 C), temperature source Esophageal, resp. rate (!) 24, height '5\' 4"'$  (1.626 m), weight 95 kg, SpO2 100 %.    Vent Mode: PRVC FiO2 (%):  [40 %-100 %] 40 % Set Rate:  [24 bmp] 24 bmp Vt Set:  [440 mL] 440 mL PEEP:  [5 cmH20-8 cmH20] 8 cmH20 Plateau Pressure:  [22 cmH20-25 cmH20] 24 cmH20    Intake/Output Summary (Last 24 hours) at 10/30/2020 1141 Last data filed at 10/30/2020 1100 Gross per 24 hour  Intake 3196.31 ml  Output 4601 ml  Net -1404.69 ml    Filed Weights   10/28/20 1457 10/29/20 0337 10/30/20 0157  Weight: 89.7 kg 96.6 kg 95 kg    Examination: General: Obese sedated on mechanical ventilation. HENT: Pupils equally round Lungs: Normal vesicular breath sounds.  Cardiovascular: HS normal.  Abdomen: Soft non tender and non distended Extremities: amputations noted bilaterally.  Neuro: No focal deficits appreciated GU: Deferred  Resolved Hospital Problem list   NA  Assessment & Plan:   Critically ill due to hypercapnic respiratory failure due to volume overload requiring mechanical ventilation and CRRT.  Critically ill due to distributive shock requiring norepinephrine.  ESRD Atrial fibrillation with variable rate control.  PNA with Pleural effusions-in the setting of pna and worsening respiratory failure most consider parapneumonic effusions Leukocytosis - cultures are negative.  DM 2  Plan:  - Continue full ventilatory support - SBT once 2-3L removed.  - CRRT for fluid removal  - Continue to support BP. Added vasopressin to allow norepinephrine to be weaned which might help heart rate control.  - Will hold on CT chest until fluid removed and patient extubated, ideally but consider CT sooner if leukocytosis continues to worsen.   Best Practice (right click and "Reselect all SmartList Selections" daily)   Diet/type: NPO tube feeds.  DVT prophylaxis: systemic heparin for  Atrial fibrillation GI prophylaxis: N/A Lines: Dialysis Catheter Foley:  N/A Code Status:  full code Last date of multidisciplinary goals of care discussion - Daughter updated 7/3 of above events and plan to sedate more until adequate amount of fluid removed, prior to SBT.   Labs   CBC: Recent Labs  Lab 10/27/20 1851 10/28/20 0358 10/29/20 0443 10/29/20 0545 10/29/20 1629  10/29/20 1630 10/30/20 0422  WBC 18.7* 17.5*  --   --  25.5*  --  27.4*  NEUTROABS 15.7*  --   --   --   --   --   --   HGB 9.5* 8.8* 10.5* 10.2* 8.0* 10.2* 8.6*  HCT 32.0* 29.4* 31.0* 30.0* 27.1* 30.0* 28.7*  MCV 81.8 82.4  --   --  83.4  --  81.8  PLT 736* 649*  --   --  543*  --  556*     Basic Metabolic Panel: Recent Labs  Lab 10/27/20 1851 10/28/20 0358 10/29/20 0443 10/29/20 0545 10/29/20 1528 10/29/20 1600 10/29/20 1629 10/29/20 1630 10/30/20 0422  NA 130* 131* 127* 127* 132*  --   --  133* 131*  K 4.0 3.9 4.6 4.8 3.9  --   --  3.9 4.7  CL 90* 95*  --   --  100  --   --   --  100  CO2 25 25  --   --  20*  --   --   --  22  GLUCOSE 230* 207*  --   --  147*  --   --   --  199*  BUN 25* 25*  --   --  22  --   --   --  17  CREATININE 6.44* 6.31*  --   --  5.00*  --   --   --  3.42*  CALCIUM 8.3* 7.6*  --   --  7.6*  --   --   --  7.9*  MG  --  1.6*  --   --   --   --  2.0  --  2.2  PHOS  --  5.7*  --   --   --  4.3  --   --  2.9    GFR: Estimated Creatinine Clearance: 18.8 mL/min (A) (by C-G formula based on SCr of 3.42 mg/dL (H)). Recent Labs  Lab 10/27/20 1851 10/27/20 1927 10/27/20 2150 10/28/20 0133 10/28/20 0358 10/29/20 1629 10/30/20 0422  PROCALCITON  --   --   --   --  4.20  --   --   WBC 18.7*  --   --   --  17.5* 25.5* 27.4*  LATICACIDVEN  --  2.9* 2.0* 2.0* 1.3  --   --      Liver Function Tests: Recent Labs  Lab 10/27/20 1851 10/28/20 0358 10/30/20 0422  AST 30 37  --   ALT 18 23  --   ALKPHOS 86 63  --   BILITOT 0.5 0.5  --   PROT 7.6 6.7  --   ALBUMIN 2.8* 2.5* 2.2*    No results for input(s): LIPASE, AMYLASE in the last 168 hours. No results for input(s): AMMONIA in the last 168 hours.  ABG    Component Value Date/Time   PHART 7.302 (L) 10/29/2020 1630   PCO2ART 45.6 10/29/2020 1630   PO2ART 249 (H) 10/29/2020 1630   HCO3 23.0 10/29/2020 1630   TCO2 24 10/29/2020 1630   ACIDBASEDEF 4.0 (H)  10/29/2020 1630   O2SAT 100.0  10/29/2020 1630      Coagulation Profile: Recent Labs  Lab 10/27/20 1851 10/28/20 0358  INR 1.5* 1.5*     Cardiac Enzymes: No results for input(s): CKTOTAL, CKMB, CKMBINDEX, TROPONINI in the last 168 hours.  HbA1C: HB A1C (BAYER DCA - WAIVED)  Date/Time Value Ref Range Status  04/06/2020 11:05 AM 5.4 <7.0 % Final    Comment:                                          Diabetic Adult            <7.0                                       Healthy Adult        4.3 - 5.7                                                           (DCCT/NGSP) American Diabetes Association's Summary of Glycemic Recommendations for Adults with Diabetes: Hemoglobin A1c <7.0%. More stringent glycemic goals (A1c <6.0%) may further reduce complications at the cost of increased risk of hypoglycemia.   01/06/2020 10:13 AM 6.2 <7.0 % Final    Comment:                                          Diabetic Adult            <7.0                                       Healthy Adult        4.3 - 5.7                                                           (DCCT/NGSP) American Diabetes Association's Summary of Glycemic Recommendations for Adults with Diabetes: Hemoglobin A1c <7.0%. More stringent glycemic goals (A1c <6.0%) may further reduce complications at the cost of increased risk of hypoglycemia.    Hgb A1c MFr Bld  Date/Time Value Ref Range Status  08/09/2020 05:23 AM 6.5 (H) 4.8 - 5.6 % Final    Comment:    (NOTE) Pre diabetes:          5.7%-6.4%  Diabetes:              >6.4%  Glycemic control for   <7.0% adults with diabetes   08/09/2020 03:48 AM 8.8 (H) 4.8 - 5.6 % Final    Comment:    QUESTIONABLE IDENTIFICATION / INCORRECTLY LABELED SPECIMEN CALLED TO J.LYON,RN 0527 08/09/2020 M.CAMPBELL (NOTE) Pre diabetes:          5.7%-6.4%  Diabetes:              >  6.4%  Glycemic control for   <7.0% adults with diabetes     CBG: Recent Labs  Lab 10/29/20 2028 10/29/20 2327 10/30/20 0331  10/30/20 0733 10/30/20 1135  GLUCAP 144* 149* 156* 231* 230*    CRITICAL CARE Performed by: Kipp Brood   Total critical care time: 40 minutes  Critical care time was exclusive of separately billable procedures and treating other patients.  Critical care was necessary to treat or prevent imminent or life-threatening deterioration.  Critical care was time spent personally by me on the following activities: development of treatment plan with patient and/or surrogate as well as nursing, discussions with consultants, evaluation of patient's response to treatment, examination of patient, obtaining history from patient or surrogate, ordering and performing treatments and interventions, ordering and review of laboratory studies, ordering and review of radiographic studies, pulse oximetry, re-evaluation of patient's condition and participation in multidisciplinary rounds.  Kipp Brood, MD [facility] ICU Physician [place]  Pager: [phone] Mobile: [phone] After hours: [phone].

## 2020-10-30 NOTE — Consult Note (Signed)
WOC Nurse Consult Note: Reason for Consult:Bilateral BKA with nonhealing incision to right amputation site.  Was seen by vascular surgeon 10/27/20 who assessed dry eschar to staple line and progressing ischemic changes.  They decided to leave staples in place and see in 3 weeks.  Wounds are unchanged and we will paint with daily betadine and leave open to air.  Wound type: Nonhealing surgical wound to right BKA site.  Pressure Injury POA:NA Measurement:1.5 cm x 10 cm staple line with 75% of the line dry eschar.  Staples in place and vascular surgeon wishes them to stay until next follow up later this month.  Wound bed: Dry eschar Drainage (amount, consistency, odor) no drainage necrotic odor.  Periwound: Cool to touch, dry skin Dressing procedure/placement/frequency: Paint right BKA site with betadine daily. Leave open to air.  Will not follow at this time.  Please re-consult if needed.  Domenic Moras MSN, RN, FNP-BC CWON Wound, Ostomy, Continence Nurse Pager [phone]

## 2020-10-30 NOTE — Progress Notes (Signed)
Progress Note  Patient Name: Christy Zhang Date of Encounter: 10/30/2020  Primary Cardiologist:   None   Subjective   Brief episode of PEA arrest recorded yesterday afternoon.  no   Inpatient Medications    Scheduled Meds:  aspirin  81 mg Per Tube Daily   atorvastatin  40 mg Per Tube Daily   chlorhexidine gluconate (MEDLINE KIT)  15 mL Mouth Rinse BID   Chlorhexidine Gluconate Cloth  6 each Topical Daily   clopidogrel  75 mg Per Tube Daily   feeding supplement (PROSource TF)  45 mL Per Tube BID   feeding supplement (VITAL HIGH PROTEIN)  1,000 mL Per Tube Q24H   fentaNYL (SUBLIMAZE) injection  100 mcg Intravenous Once   fentaNYL (SUBLIMAZE) injection  50 mcg Intravenous Once   guaiFENesin  15 mL Per Tube Q6H   insulin aspart  0-6 Units Subcutaneous Q4H   insulin detemir  8 Units Subcutaneous QHS   ipratropium-albuterol  3 mL Nebulization TID   mouth rinse  15 mL Mouth Rinse 10 times per day   pantoprazole (PROTONIX) IV  40 mg Intravenous Q24H   sodium chloride flush  10-40 mL Intracatheter Q12H   Continuous Infusions:   prismasol BGK 4/2.5 400 mL/hr at 10/30/20 0749    prismasol BGK 4/2.5 200 mL/hr at 10/30/20 0753   amiodarone 30 mg/hr (10/30/20 0700)   ceFEPime (MAXIPIME) IV Stopped (10/29/20 2237)   fentaNYL infusion INTRAVENOUS 150 mcg/hr (10/30/20 0700)   heparin 1,250 Units/hr (10/30/20 0731)   norepinephrine (LEVOPHED) Adult infusion 26 mcg/min (10/30/20 0700)   prismasol BGK 4/2.5 1,800 mL/hr at 10/30/20 0540   propofol (DIPRIVAN) infusion 30 mcg/kg/min (10/30/20 0700)   vancomycin Stopped (10/29/20 1845)   PRN Meds: acetaminophen (TYLENOL) oral liquid 160 mg/5 mL, albuterol, alteplase, heparin, hydrALAZINE, metoprolol tartrate, midazolam, sodium chloride, sodium chloride flush   Vital Signs    Vitals:   10/30/20 0430 10/30/20 0500 10/30/20 0600 10/30/20 0700  BP:  (!) 133/54 (!) 151/56   Pulse: 77 100 100   Resp:    (!) 24  Temp:      TempSrc:       SpO2: 100% 99% 100%   Weight:      Height:        Intake/Output Summary (Last 24 hours) at 10/30/2020 0755 Last data filed at 10/30/2020 0700 Gross per 24 hour  Intake 3007.76 ml  Output 4469 ml  Net -1461.24 ml   Filed Weights   10/28/20 1457 10/29/20 0337 10/30/20 0157  Weight: 89.7 kg 96.6 kg 95 kg    Telemetry    NSR with PAF - Personally Reviewed  ECG    NA - Personally Reviewed  Physical Exam   GEN:   Critically ill appearing Neck: No  JVD Cardiac: RRR, no murmurs, rubs, or gallops.  Respiratory:      Reduced breath sounds GI: Soft, nontender, non-distended, normal bowel sounds  MS:  No edema; No deformity.  Status post bilateral amputations.  Neuro:   Intubated and sedated.    Labs    Chemistry Recent Labs  Lab 10/27/20 1851 10/28/20 0358 10/29/20 0443 10/29/20 1528 10/29/20 1630 10/30/20 0422  NA 130* 131*   < > 132* 133* 131*  K 4.0 3.9   < > 3.9 3.9 4.7  CL 90* 95*  --  100  --  100  CO2 25 25  --  20*  --  22  GLUCOSE 230* 207*  --  147*  --  199*  BUN 25* 25*  --  22  --  17  CREATININE 6.44* 6.31*  --  5.00*  --  3.42*  CALCIUM 8.3* 7.6*  --  7.6*  --  7.9*  PROT 7.6 6.7  --   --   --   --   ALBUMIN 2.8* 2.5*  --   --   --  2.2*  AST 30 37  --   --   --   --   ALT 18 23  --   --   --   --   ALKPHOS 86 63  --   --   --   --   BILITOT 0.5 0.5  --   --   --   --   GFRNONAA 7* 7*  --  9*  --  14*  ANIONGAP 15 11  --  12  --  9   < > = values in this interval not displayed.     Hematology Recent Labs  Lab 10/28/20 0358 10/29/20 0443 10/29/20 1629 10/29/20 1630 10/30/20 0422  WBC 17.5*  --  25.5*  --  27.4*  RBC 3.57*  --  3.25*  --  3.51*  HGB 8.8*   < > 8.0* 10.2* 8.6*  HCT 29.4*   < > 27.1* 30.0* 28.7*  MCV 82.4  --  83.4  --  81.8  MCH 24.6*  --  24.6*  --  24.5*  MCHC 29.9*  --  29.5*  --  30.0  RDW 18.7*  --  18.8*  --  19.6*  PLT 649*  --  543*  --  556*   < > = values in this interval not displayed.    Cardiac  EnzymesNo results for input(s): TROPONINI in the last 168 hours. No results for input(s): TROPIPOC in the last 168 hours.   BNPNo results for input(s): BNP, PROBNP in the last 168 hours.   DDimer No results for input(s): DDIMER in the last 168 hours.   Radiology    DG Chest 1 View  Result Date: 10/29/2020 CLINICAL DATA:  Cardiac arrest. EXAM: CHEST  1 VIEW COMPARISON:  Earlier today. FINDINGS: Endotracheal tube tip 2.4 cm from the carina. Enteric tube in place with tip and side-port below the diaphragm. Left-sided dialysis catheter is left central line in the SVC. Stable cardiomegaly. Unchanged mediastinal contours. Improving aeration with decreasing bilateral pulmonary opacities. Improving bilateral pleural effusions, left greater than right. Persistent retrocardiac opacity. No pneumothorax. Vascular stent in the right axilla. IMPRESSION: 1. Endotracheal tube tip 2.4 cm from the carina. Stable enteric tube and left central line. 2. Improving aeration with decreasing bilateral pulmonary opacities, likely improving pulmonary edema. Improving pleural effusions. Persistent dense retrocardiac opacity. 3. Stable cardiomegaly. Electronically Signed   By: Keith Rake M.D.   On: 10/29/2020 16:11   DG Abd 1 View  Result Date: 10/29/2020 CLINICAL DATA:  Status post or gastric feeding tube placement. EXAM: ABDOMEN - 1 VIEW COMPARISON:  Chest x-ray today FINDINGS: Enteric tube has been placed, tip overlying the level of the proximal to mid stomach. There is persistent dense opacity in the LEFT LOWER lobe. Paucity of bowel gas. IMPRESSION: Enteric tube tip overlying the level of the stomach. Electronically Signed   By: [name] M.D.   On: 10/29/2020 12:15   DG Chest Port 1 View  Result Date: 10/29/2020 CLINICAL DATA:  65 year old female with hypoxia.  Intubated. EXAM: PORTABLE CHEST 1 VIEW COMPARISON:  10/28/2020 and earlier.  FINDINGS: Portable AP semi upright view at 0446 hours. Endotracheal tube in  good position projecting over the airway between the clavicles and carina. A left chest approach vascular catheter projects at the cavoatrial junction region. Mildly improved lung volumes and decreased but not resolved veiling opacity in the left hemithorax with continued obscuration of the left hemidiaphragm. Dense retrocardiac opacity. Probable cardiomegaly. Improved right lung base ventilation. No pneumothorax. Paucity of bowel gas in the upper abdomen. IMPRESSION: 1. Endotracheal tube in good position. Left chest approach central line tip in good position near the cavoatrial junction. 2. Mildly improved lung volumes and ventilation. Continued left greater than right pleural effusions, with left lower lobe collapse or consolidation. Electronically Signed   By: Genevie Ann M.D.   On: 10/29/2020 05:13   DG CHEST PORT 1 VIEW  Result Date: 10/28/2020 CLINICAL DATA:  Hypoxemia. EXAM: PORTABLE CHEST 1 VIEW COMPARISON:  Radiograph yesterday. FINDINGS: Cardiomegaly. Worsening pleural effusions. Retrocardiac opacity is partially obscured by pleural fluid on the current exam. Lower lung volumes likely due to increasing compressive atelectasis. Increasing vascular congestion. Vascular stent in the region of the right subclavian. No pneumothorax. IMPRESSION: 1. Worsening pleural effusions and vascular congestion. Left pleural effusion obscures retrocardiac opacity. 2. Lower lung volumes likely due to increasing compressive atelectasis. 3. Cardiomegaly. Electronically Signed   By: Keith Rake M.D.   On: 10/28/2020 22:32   ECHOCARDIOGRAM COMPLETE  Result Date: 10/29/2020    ECHOCARDIOGRAM REPORT   Patient Name:   ADRINNE SZE Date of Exam: 10/29/2020 Medical Rec #:  [id number]         Height:       64.0 in Accession #:    [id number]        Weight:       213.0 lb Date of Birth:  [birth year]         BSA:          2.009 m Patient Age:    17 years          BP:           54/105 mmHg Patient Gender: F                 HR:            101 bpm. Exam Location:  Inpatient Procedure: 2D Echo, Color Doppler and Cardiac Doppler Indications:    R06.9 DOE  History:        Patient has no prior history of Echocardiogram examinations.                 CHF; Risk Factors:Hypertension, Diabetes and Dyslipidemia.  Sonographer:    Raquel Sarna Senior RDCS Referring Phys: [phone] [facility]  Sonographer Comments: Scanned supine on artificial respirator. IMPRESSIONS  1. Left ventricular ejection fraction, by estimation, is 55 to 60%. The left ventricle has normal function. The left ventricle has no regional wall motion abnormalities. There is moderate left ventricular hypertrophy. Left ventricular diastolic parameters are indeterminate.  2. Right ventricular systolic function is normal. The right ventricular size is normal. There is moderately elevated pulmonary artery systolic pressure.  3. A small pericardial effusion is present.  4. The mitral valve is normal in structure. No evidence of mitral valve regurgitation. No evidence of mitral stenosis.  5. The aortic valve is tricuspid. Aortic valve regurgitation is not visualized. No aortic stenosis is present.  6. The inferior vena cava is dilated in size with <50% respiratory variability, suggesting right atrial pressure of 15  mmHg. FINDINGS  Left Ventricle: Left ventricular ejection fraction, by estimation, is 55 to 60%. The left ventricle has normal function. The left ventricle has no regional wall motion abnormalities. The left ventricular internal cavity size was normal in size. There is  moderate left ventricular hypertrophy. Left ventricular diastolic parameters are indeterminate. Right Ventricle: The right ventricular size is normal.Right ventricular systolic function is normal. There is moderately elevated pulmonary artery systolic pressure. The tricuspid regurgitant velocity is 3.09 m/s, and with an assumed right atrial pressure of 15 mmHg, the estimated right ventricular systolic pressure is 97.5 mmHg.  Left Atrium: Left atrial size was normal in size. Right Atrium: Right atrial size was normal in size. Pericardium: A small pericardial effusion is present. Mitral Valve: The mitral valve is normal in structure. No evidence of mitral valve regurgitation. No evidence of mitral valve stenosis. Tricuspid Valve: The tricuspid valve is normal in structure. Tricuspid valve regurgitation is mild . No evidence of tricuspid stenosis. Aortic Valve: The aortic valve is tricuspid. Aortic valve regurgitation is not visualized. No aortic stenosis is present. Pulmonic Valve: The pulmonic valve was normal in structure. Pulmonic valve regurgitation is trivial. No evidence of pulmonic stenosis. Aorta: The aortic root is normal in size and structure. Venous: The inferior vena cava is dilated in size with less than 50% respiratory variability, suggesting right atrial pressure of 15 mmHg. IAS/Shunts: No atrial level shunt detected by color flow Doppler.  LEFT VENTRICLE PLAX 2D LVIDd:         3.30 cm  Diastology LVIDs:         2.10 cm  LV e' medial:    5.22 cm/s LV PW:         1.50 cm  LV E/e' medial:  14.2 LV IVS:        1.30 cm  LV e' lateral:   6.85 cm/s LVOT diam:     1.80 cm  LV E/e' lateral: 10.8 LV SV:         31 LV SV Index:   15 LVOT Area:     2.54 cm  RIGHT VENTRICLE RV S prime:     9.36 cm/s TAPSE (M-mode): 1.1 cm LEFT ATRIUM           Index       RIGHT ATRIUM           Index LA diam:      4.10 cm 2.04 cm/m  RA Area:     13.60 cm LA Vol (A2C): 49.1 ml 24.44 ml/m RA Volume:   37.10 ml  18.46 ml/m LA Vol (A4C): 50.4 ml 25.08 ml/m  AORTIC VALVE LVOT Vmax:   76.65 cm/s LVOT Vmean:  49.000 cm/s LVOT VTI:    0.122 m  AORTA Ao Root diam: 2.80 cm Ao Asc diam:  3.00 cm MITRAL VALVE               TRICUSPID VALVE MV Area (PHT): 3.74 cm    TR Peak grad:   38.2 mmHg MV Decel Time: 203 msec    TR Vmax:        309.00 cm/s MV E velocity: 73.90 cm/s MV A velocity: 46.10 cm/s  SHUNTS MV E/A ratio:  1.60        Systemic VTI:  0.12 m                             Systemic Diam: 1.80 cm Kirk Ruths MD  Electronically signed by Kirk Ruths MD Signature Date/Time: 10/29/2020/10:42:54 AM    Final     Cardiac Studies   ECHO:   1. Left ventricular ejection fraction, by estimation, is 55 to 60%. The  left ventricle has normal function. The left ventricle has no regional  wall motion abnormalities. There is moderate left ventricular hypertrophy.  Left ventricular diastolic  parameters are indeterminate.   2. Right ventricular systolic function is normal. The right ventricular  size is normal. There is moderately elevated pulmonary artery systolic  pressure.   3. A small pericardial effusion is present.   4. The mitral valve is normal in structure. No evidence of mitral valve  regurgitation. No evidence of mitral stenosis.   5. The aortic valve is tricuspid. Aortic valve regurgitation is not  visualized. No aortic stenosis is present.   6. The inferior vena cava is dilated in size with <50% respiratory  variability, suggesting right atrial pressure of 15 mmHg.   Patient Profile     65 y.o. female the patient has a history of end-stage renal disease and diabetes mellitus.  She is dialysis dependent.  She has had peripheral vascular disease with bilateral amputations.  She has not had prior cardiac history.  Assessment & Plan    RESPIRATORY FAILURE:  Acute on chronic diastolic failure on previous pneumonia with worsening edema related to atrial flutter with rapid ventricular rate.  Vent per CCM, volume management with CRRT.  CXR today with improving aeration.    SHOCK:  On norepi.   BP should allow to be weaned down.    ATRIAL FLUTTER:     Paroxysmal atrial fib/flutter.   Continue IV amiodarone and heparin per pharmacy.     For questions or updates, please contact [facility] Please consult [email or web address] for contact info under Cardiology/STEMI.   Signed, Minus Breeding, MD  10/30/2020, 7:55 AM

## 2020-10-30 NOTE — Consult Note (Deleted)
[place] Nurse wound consult note Consultation was completed by review of records, images and assistance from the bedside nurse/clinical staff.   Reason for Consult: Stump wound  Patient s/p BKA 09/14/20 per Dr. Donzetta Matters. They are following this patient in their office for same Wound type: surgical  Pressure Injury POA: NA Measurement: see nursing flow sheets Wound bed: 100% necrotic tissue along the incision, dry eschar Drainage (amount, consistency, odor) none Periwound: intact  Dressing procedure/placement/frequency:  Paint with betadine daily, cover with dry dressing  Suggested consulting [facility] for same since they have been following skin necrosis and stump wound in their office post op.   Re consult if needed, will not follow at this time. Thanks  Jerrick Farve [employer], RN,CWOCN, CNS, [place] [phone])

## 2020-10-30 NOTE — Progress Notes (Signed)
ANTICOAGULATION CONSULT NOTE  Pharmacy Consult for Heparin  Indication: atrial fibrillation  Allergies  Allergen Reactions   Sulfa Antibiotics Swelling    Patient Measurements: Height: '5\' 4"'$  (162.6 cm) Weight: 95 kg (209 lb 7 oz) IBW/kg (Calculated) : 54.7   Vital Signs: Temp: 98.6 F (37 C) (07/04 0400) Temp Source: Esophageal (07/04 0400) BP: 151/56 (07/04 0600) Pulse Rate: 100 (07/04 0600)  Labs: Recent Labs    10/27/20 1851 10/27/20 2223 10/28/20 0130 10/28/20 0358 10/28/20 0552 10/29/20 0443 10/29/20 1454 10/29/20 1528 10/29/20 1629 10/29/20 1630 10/30/20 0422  HGB 9.5*  --   --  8.8*  --    < >  --   --  8.0* 10.2* 8.6*  HCT 32.0*  --   --  29.4*  --    < >  --   --  27.1* 30.0* 28.7*  PLT 736*  --   --  649*  --   --   --   --  543*  --  556*  APTT 31  --   --  33  --   --   --   --   --   --  104*  LABPROT 18.3*  --   --  17.9*  --   --   --   --   --   --   --   INR 1.5*  --   --  1.5*  --   --   --   --   --   --   --   HEPARINUNFRC  --   --   --   --   --   --  0.29*  --   --   --  0.28*  CREATININE 6.44*  --   --  6.31*  --   --   --  5.00*  --   --  3.42*  TROPONINIHS  --    < > 82* 109* 107*  --   --   --   --   --   --    < > = values in this interval not displayed.     Estimated Creatinine Clearance: 18.8 mL/min (A) (by C-G formula based on SCr of 3.42 mg/dL (H)).   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 ESRD on HD now starting CRRT. She is also noted to be in afib/flutter and beginning IV heparin per pharmacy.  Underwent brief PEA arrest 7/3. Heparin level is slightly  subtherapeutic at 0.28, on 1200 units/hr. Hgb 8.6, platelet count elevated. No s/sx of bleeding or infusion issues.   Goal of Therapy:  Heparin level 0.3-0.7 units/ml Monitor platelets by anticoagulation protocol: Yes   Plan:  Increase heparin drip to 1250 units/hr to keep in goal range Daily CBC/Heparin level Monitor for bleeding  [place] Crane, Vena Austria, BCPS, [facility] Clinical Pharmacist  10/30/2020 7:29 AM   [facility] pharmacy phone numbers are listed on [email or web address]

## 2020-10-30 NOTE — Progress Notes (Signed)
Initial Nutrition Assessment  DOCUMENTATION CODES:   Not applicable  INTERVENTION:   Tube feeding:  -Vital 1.5 @ 45 ml/hr via OG (1080 ml) -ProSource TF 90 ml TID  Provides: 1860 kcals (2333 ml with propofol), 139 grams protein, 825 ml free water.   B complex with Vitamin C to account for losses with CRRT  NUTRITION DIAGNOSIS:   Increased nutrient needs related to acute illness as evidenced by estimated needs.  GOAL:   Patient will meet greater than or equal to 90% of their needs  MONITOR:   Vent status, Skin, TF tolerance, Weight trends, Labs, I & O's  REASON FOR ASSESSMENT:   Consult, Ventilator Enteral/tube feeding initiation and management  ASSESSMENT:   Patient with PMH significant for HTN, HLD, PVD s/p bilateral BKA, DM, ESRD on HD, and CHF. Presents this admission with volume overload and PNA with pleural effusions.  Had brief PEA arrest yesterday afternoon. Able to wean slightly from levophed this am. Remains on propofol. On CRRT- pulling 100 ml/hr per RN. Vital High Protein started at 40 ml/hr yesterday. Transition to Vital 1.5 to better meet needs.   Noted patient under EDW. Will need repeat NFPE once diuresed.   EDW: 97 kg  Current weight: 95 kg   Patient is currently intubated on ventilator support MV: 9.8 L/min Temp (24hrs), Avg:98.3 F (36.8 C), Min:97.2 F (36.2 C), Max:99 F (37.2 C)  Propofol: 17.9 ml/hr- provides 473 kcal from lipids daily   CRRT: 4419 ml x 24 hrs   Drips: levophed, propofol, vasopressin Medications: SS novolog, levemir Labs: Na 131 (L) CBG 143-231  NUTRITION - FOCUSED PHYSICAL EXAM:  Flowsheet Row Most Recent Value  Orbital Region No depletion  Upper Arm Region No depletion  Thoracic and Lumbar Region Unable to assess  Buccal Region Unable to assess  Temple Region No depletion  Clavicle Bone Region No depletion  Clavicle and Acromion Bone Region No depletion  Scapular Bone Region Unable to assess  Dorsal Hand No  depletion  Patellar Region Unable to assess  Anterior Thigh Region Unable to assess  Posterior Calf Region Unable to assess  Edema (RD Assessment) Moderate  [BUE]  Hair Reviewed  Eyes Unable to assess  Mouth Unable to assess  Skin Reviewed  Nails Reviewed      Diet Order:   Diet Order             Diet NPO time specified  Diet effective now                   EDUCATION NEEDS:   Not appropriate for education at this time  Skin:  Skin Assessment: Skin Integrity Issues: Skin Integrity Issues:: Other (Comment), Incisions, Stage II Stage II: coccyx Incisions: R BKA nonhealing Other: MASD- R groin  Last BM:  PTA  Height:   Ht Readings from Last 1 Encounters:  10/29/20 '5\' 4"'$  (1.626 m)    Weight:   Wt Readings from Last 1 Encounters:  10/30/20 95 kg    BMI:  Body mass index is 35.95 kg/m.  Estimated Nutritional Needs:   Kcal:  2100-2300 kcal  Protein:  135-155 grams  Fluid:  1000 ml + UOP (liberalize with CRRT)  Dierra Riesgo MS, RD, LDN, CNSC Clinical Nutrition Pager listed in [place]

## 2020-10-30 NOTE — Progress Notes (Signed)
[facility] NEPHROLOGY PROGRESS NOTE  Assessment/ Plan: Pt is a 65 y.o. yo female  with history of HTN, HLD, peripheral vascular disease status post bilateral BKA, DM, anemia, ESRD on HD MWF at [facility] presented with cough, shortness of breath and generalized weakness, seen as a consultation for the management of dialysis.  Dialysis Orders from previous admission:  [facility] - MWF 4 hrs 400/500 97 kg 2.0K/2.5 Ca 16g needles -Heparin 1000 units initial bolus-heparin pump 1000 units/hr stop 1 hour prior to end of tx  -Epogen 14000 units IV TIW -Hectorol 1.5 mcg IV TIW  #Septic shock due to left lung pneumonia: Patient was transferred to [facility].  Vent support per CCM.  Currently on vancomycin and cefepime.  Maintaining blood pressure with Levophed.  #Vent dependent respiratory failure due to both pneumonia and pulm edema.  Antibiotics as above and volume management with CRRT.  # ESRD MWF schedule.  Now on CRRT because of hemodynamic instability.  Tolerating CRRT well.  On IV heparin for anticoagulation.  UF as tolerated.  Discussed with the nurse.  #A. fib with RVR: Currently on amiodarone and heparin.  Cardiology is following.   # Anemia: Hemoglobin 8.6 today.  Continue monitor.  Resume ESA.  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  Resume sevelamer when able to take orally.  #Hyponatremia, hypervolemic: Managed with CRRT.  Subjective: Seen and examined in ICU.  Brief PEA arrest yesterday afternoon: 2 min cpr, 1 amp epi. Possible vagal response during agitation. Tolerating CRRT now. Remains on levo and amio. ~Net neg 1.4L Objective Vital signs in last 24 hours: Vitals:   10/30/20 0500 10/30/20 0600 10/30/20 0700 10/30/20 0800  BP: (!) 133/54 (!) 151/56    Pulse: 100 100    Resp:   (!) 24 (!) 24  Temp:    99 F (37.2 C)  TempSrc:    Esophageal  SpO2: 99% 100%    Weight:      Height:       Weight change: 1 kg  Intake/Output Summary (Last 24 hours) at  10/30/2020 0829 Last data filed at 10/30/2020 0800 Gross per 24 hour  Intake 3065.77 ml  Output 4514 ml  Net -1448.23 ml       Labs: Basic Metabolic Panel: Recent Labs  Lab 10/28/20 0358 10/29/20 0443 10/29/20 1528 10/29/20 1600 10/29/20 1630 10/30/20 0422  NA 131*   < > 132*  --  133* 131*  K 3.9   < > 3.9  --  3.9 4.7  CL 95*  --  100  --   --  100  CO2 25  --  20*  --   --  22  GLUCOSE 207*  --  147*  --   --  199*  BUN 25*  --  22  --   --  17  CREATININE 6.31*  --  5.00*  --   --  3.42*  CALCIUM 7.6*  --  7.6*  --   --  7.9*  PHOS 5.7*  --   --  4.3  --  2.9   < > = values in this interval not displayed.   Liver Function Tests: Recent Labs  Lab 10/27/20 1851 10/28/20 0358 10/30/20 0422  AST 30 37  --   ALT 18 23  --   ALKPHOS 86 63  --   BILITOT 0.5 0.5  --   PROT 7.6 6.7  --   ALBUMIN 2.8* 2.5* 2.2*   No results  for input(s): LIPASE, AMYLASE in the last 168 hours. No results for input(s): AMMONIA in the last 168 hours. CBC: Recent Labs  Lab 10/27/20 1851 10/28/20 0358 10/29/20 0443 10/29/20 1629 10/29/20 1630 10/30/20 0422  WBC 18.7* 17.5*  --  25.5*  --  27.4*  NEUTROABS 15.7*  --   --   --   --   --   HGB 9.5* 8.8*   < > 8.0* 10.2* 8.6*  HCT 32.0* 29.4*   < > 27.1* 30.0* 28.7*  MCV 81.8 82.4  --  83.4  --  81.8  PLT 736* 649*  --  543*  --  556*   < > = values in this interval not displayed.   Cardiac Enzymes: No results for input(s): CKTOTAL, CKMB, CKMBINDEX, TROPONINI in the last 168 hours. CBG: Recent Labs  Lab 10/29/20 1632 10/29/20 2028 10/29/20 2327 10/30/20 0331 10/30/20 0733  GLUCAP 143* 144* 149* 156* 231*    Iron Studies: No results for input(s): IRON, TIBC, TRANSFERRIN, FERRITIN in the last 72 hours. Studies/Results: DG Chest 1 View  Result Date: 10/29/2020 CLINICAL DATA:  Cardiac arrest. EXAM: CHEST  1 VIEW COMPARISON:  Earlier today. FINDINGS: Endotracheal tube tip 2.4 cm from the carina. Enteric tube in place with tip  and side-port below the diaphragm. Left-sided dialysis catheter is left central line in the SVC. Stable cardiomegaly. Unchanged mediastinal contours. Improving aeration with decreasing bilateral pulmonary opacities. Improving bilateral pleural effusions, left greater than right. Persistent retrocardiac opacity. No pneumothorax. Vascular stent in the right axilla. IMPRESSION: 1. Endotracheal tube tip 2.4 cm from the carina. Stable enteric tube and left central line. 2. Improving aeration with decreasing bilateral pulmonary opacities, likely improving pulmonary edema. Improving pleural effusions. Persistent dense retrocardiac opacity. 3. Stable cardiomegaly. Electronically Signed   By: Keith Rake M.D.   On: 10/29/2020 16:11   DG Abd 1 View  Result Date: 10/29/2020 CLINICAL DATA:  Status post or gastric feeding tube placement. EXAM: ABDOMEN - 1 VIEW COMPARISON:  Chest x-ray today FINDINGS: Enteric tube has been placed, tip overlying the level of the proximal to mid stomach. There is persistent dense opacity in the LEFT LOWER lobe. Paucity of bowel gas. IMPRESSION: Enteric tube tip overlying the level of the stomach. Electronically Signed   By: [name] M.D.   On: 10/29/2020 12:15   DG Chest Port 1 View  Result Date: 10/29/2020 CLINICAL DATA:  65 year old female with hypoxia.  Intubated. EXAM: PORTABLE CHEST 1 VIEW COMPARISON:  10/28/2020 and earlier. FINDINGS: Portable AP semi upright view at 0446 hours. Endotracheal tube in good position projecting over the airway between the clavicles and carina. A left chest approach vascular catheter projects at the cavoatrial junction region. Mildly improved lung volumes and decreased but not resolved veiling opacity in the left hemithorax with continued obscuration of the left hemidiaphragm. Dense retrocardiac opacity. Probable cardiomegaly. Improved right lung base ventilation. No pneumothorax. Paucity of bowel gas in the upper abdomen. IMPRESSION: 1.  Endotracheal tube in good position. Left chest approach central line tip in good position near the cavoatrial junction. 2. Mildly improved lung volumes and ventilation. Continued left greater than right pleural effusions, with left lower lobe collapse or consolidation. Electronically Signed   By: Genevie Ann M.D.   On: 10/29/2020 05:13   DG CHEST PORT 1 VIEW  Result Date: 10/28/2020 CLINICAL DATA:  Hypoxemia. EXAM: PORTABLE CHEST 1 VIEW COMPARISON:  Radiograph yesterday. FINDINGS: Cardiomegaly. Worsening pleural effusions. Retrocardiac opacity is partially obscured by  pleural fluid on the current exam. Lower lung volumes likely due to increasing compressive atelectasis. Increasing vascular congestion. Vascular stent in the region of the right subclavian. No pneumothorax. IMPRESSION: 1. Worsening pleural effusions and vascular congestion. Left pleural effusion obscures retrocardiac opacity. 2. Lower lung volumes likely due to increasing compressive atelectasis. 3. Cardiomegaly. Electronically Signed   By: Keith Rake M.D.   On: 10/28/2020 22:32   ECHOCARDIOGRAM COMPLETE  Result Date: 10/29/2020    ECHOCARDIOGRAM REPORT   Patient Name:   Christy Zhang Date of Exam: 10/29/2020 Medical Rec #:  [id number]         Height:       64.0 in Accession #:    [id number]        Weight:       213.0 lb Date of Birth:  03/01/1956         BSA:          2.009 m Patient Age:    66 years          BP:           54/105 mmHg Patient Gender: F                 HR:           101 bpm. Exam Location:  Inpatient Procedure: 2D Echo, Color Doppler and Cardiac Doppler Indications:    R06.9 DOE  History:        Patient has no prior history of Echocardiogram examinations.                 CHF; Risk Factors:Hypertension, Diabetes and Dyslipidemia.  Sonographer:    Raquel Sarna Senior RDCS Referring Phys: [phone] [facility]  Sonographer Comments: Scanned supine on artificial respirator. IMPRESSIONS  1. Left ventricular ejection fraction, by  estimation, is 55 to 60%. The left ventricle has normal function. The left ventricle has no regional wall motion abnormalities. There is moderate left ventricular hypertrophy. Left ventricular diastolic parameters are indeterminate.  2. Right ventricular systolic function is normal. The right ventricular size is normal. There is moderately elevated pulmonary artery systolic pressure.  3. A small pericardial effusion is present.  4. The mitral valve is normal in structure. No evidence of mitral valve regurgitation. No evidence of mitral stenosis.  5. The aortic valve is tricuspid. Aortic valve regurgitation is not visualized. No aortic stenosis is present.  6. The inferior vena cava is dilated in size with <50% respiratory variability, suggesting right atrial pressure of 15 mmHg. FINDINGS  Left Ventricle: Left ventricular ejection fraction, by estimation, is 55 to 60%. The left ventricle has normal function. The left ventricle has no regional wall motion abnormalities. The left ventricular internal cavity size was normal in size. There is  moderate left ventricular hypertrophy. Left ventricular diastolic parameters are indeterminate. Right Ventricle: The right ventricular size is normal.Right ventricular systolic function is normal. There is moderately elevated pulmonary artery systolic pressure. The tricuspid regurgitant velocity is 3.09 m/s, and with an assumed right atrial pressure of 15 mmHg, the estimated right ventricular systolic pressure is 44.0 mmHg. Left Atrium: Left atrial size was normal in size. Right Atrium: Right atrial size was normal in size. Pericardium: A small pericardial effusion is present. Mitral Valve: The mitral valve is normal in structure. No evidence of mitral valve regurgitation. No evidence of mitral valve stenosis. Tricuspid Valve: The tricuspid valve is normal in structure. Tricuspid valve regurgitation is mild . No evidence of tricuspid stenosis. Aortic Valve:  The aortic valve is  tricuspid. Aortic valve regurgitation is not visualized. No aortic stenosis is present. Pulmonic Valve: The pulmonic valve was normal in structure. Pulmonic valve regurgitation is trivial. No evidence of pulmonic stenosis. Aorta: The aortic root is normal in size and structure. Venous: The inferior vena cava is dilated in size with less than 50% respiratory variability, suggesting right atrial pressure of 15 mmHg. IAS/Shunts: No atrial level shunt detected by color flow Doppler.  LEFT VENTRICLE PLAX 2D LVIDd:         3.30 cm  Diastology LVIDs:         2.10 cm  LV e' medial:    5.22 cm/s LV PW:         1.50 cm  LV E/e' medial:  14.2 LV IVS:        1.30 cm  LV e' lateral:   6.85 cm/s LVOT diam:     1.80 cm  LV E/e' lateral: 10.8 LV SV:         31 LV SV Index:   15 LVOT Area:     2.54 cm  RIGHT VENTRICLE RV S prime:     9.36 cm/s TAPSE (M-mode): 1.1 cm LEFT ATRIUM           Index       RIGHT ATRIUM           Index LA diam:      4.10 cm 2.04 cm/m  RA Area:     13.60 cm LA Vol (A2C): 49.1 ml 24.44 ml/m RA Volume:   37.10 ml  18.46 ml/m LA Vol (A4C): 50.4 ml 25.08 ml/m  AORTIC VALVE LVOT Vmax:   76.65 cm/s LVOT Vmean:  49.000 cm/s LVOT VTI:    0.122 m  AORTA Ao Root diam: 2.80 cm Ao Asc diam:  3.00 cm MITRAL VALVE               TRICUSPID VALVE MV Area (PHT): 3.74 cm    TR Peak grad:   38.2 mmHg MV Decel Time: 203 msec    TR Vmax:        309.00 cm/s MV E velocity: 73.90 cm/s MV A velocity: 46.10 cm/s  SHUNTS MV E/A ratio:  1.60        Systemic VTI:  0.12 m                            Systemic Diam: 1.80 cm Kirk Ruths MD Electronically signed by Kirk Ruths MD Signature Date/Time: 10/29/2020/10:42:54 AM    Final     Medications: Infusions:   prismasol BGK 4/2.5 400 mL/hr at 10/30/20 0749    prismasol BGK 4/2.5 200 mL/hr at 10/30/20 0753   amiodarone 30 mg/hr (10/30/20 0800)   ceFEPime (MAXIPIME) IV Stopped (10/29/20 2237)   fentaNYL infusion INTRAVENOUS 150 mcg/hr (10/30/20 0800)   heparin 1,250 Units/hr  (10/30/20 0800)   norepinephrine (LEVOPHED) Adult infusion 22 mcg/min (10/30/20 0800)   prismasol BGK 4/2.5 1,800 mL/hr at 10/30/20 0801   propofol (DIPRIVAN) infusion 30 mcg/kg/min (10/30/20 0800)   vancomycin Stopped (10/29/20 1845)    Scheduled Medications:  aspirin  81 mg Per Tube Daily   atorvastatin  40 mg Per Tube Daily   chlorhexidine gluconate (MEDLINE KIT)  15 mL Mouth Rinse BID   Chlorhexidine Gluconate Cloth  6 each Topical Daily   clopidogrel  75 mg Per Tube Daily   feeding supplement (PROSource TF)  45  mL Per Tube BID   feeding supplement (VITAL HIGH PROTEIN)  1,000 mL Per Tube Q24H   fentaNYL (SUBLIMAZE) injection  100 mcg Intravenous Once   fentaNYL (SUBLIMAZE) injection  50 mcg Intravenous Once   guaiFENesin  15 mL Per Tube Q6H   insulin aspart  0-6 Units Subcutaneous Q4H   insulin detemir  8 Units Subcutaneous QHS   ipratropium-albuterol  3 mL Nebulization TID   mouth rinse  15 mL Mouth Rinse 10 times per day   pantoprazole (PROTONIX) IV  40 mg Intravenous Q24H   sodium chloride flush  10-40 mL Intracatheter Q12H    have reviewed scheduled and prn medications.  Physical Exam: General: Critically ill looking female intubated, sedated Heart:RRR, s1s2 nl Lungs: Coarse breath sound bilateral, Abdomen:soft,  non-distended Extremities: Bilateral BKA Dialysis Access: Left IJ temporary HD catheter for CRRT.  AV fistula has good thrill and bruit.  Diyari Cherne 10/30/2020,8:29 AM  LOS: 3 days

## 2020-10-31 ENCOUNTER — Inpatient Hospital Stay (HOSPITAL_COMMUNITY): Payer: Medicare Other

## 2020-10-31 DIAGNOSIS — D638 Anemia in other chronic diseases classified elsewhere: Secondary | ICD-10-CM

## 2020-10-31 DIAGNOSIS — J9601 Acute respiratory failure with hypoxia: Secondary | ICD-10-CM | POA: Diagnosis not present

## 2020-10-31 DIAGNOSIS — I480 Paroxysmal atrial fibrillation: Secondary | ICD-10-CM | POA: Diagnosis not present

## 2020-10-31 DIAGNOSIS — J189 Pneumonia, unspecified organism: Secondary | ICD-10-CM | POA: Diagnosis not present

## 2020-10-31 DIAGNOSIS — I469 Cardiac arrest, cause unspecified: Secondary | ICD-10-CM | POA: Diagnosis not present

## 2020-10-31 LAB — BODY FLUID CELL COUNT WITH DIFFERENTIAL
Eos, Fluid: 0 %
Lymphs, Fluid: 12 %
Monocyte-Macrophage-Serous Fluid: 63 % (ref 50–90)
Neutrophil Count, Fluid: 25 % (ref 0–25)
Total Nucleated Cell Count, Fluid: 895 cu mm (ref 0–1000)

## 2020-10-31 LAB — GLUCOSE, CAPILLARY
Glucose-Capillary: 239 mg/dL — ABNORMAL HIGH (ref 70–99)
Glucose-Capillary: 248 mg/dL — ABNORMAL HIGH (ref 70–99)
Glucose-Capillary: 279 mg/dL — ABNORMAL HIGH (ref 70–99)
Glucose-Capillary: 294 mg/dL — ABNORMAL HIGH (ref 70–99)
Glucose-Capillary: 314 mg/dL — ABNORMAL HIGH (ref 70–99)
Glucose-Capillary: 337 mg/dL — ABNORMAL HIGH (ref 70–99)
Glucose-Capillary: 367 mg/dL — ABNORMAL HIGH (ref 70–99)

## 2020-10-31 LAB — GLUCOSE, PLEURAL OR PERITONEAL FLUID: Glucose, Fluid: 303 mg/dL

## 2020-10-31 LAB — RENAL FUNCTION PANEL
Albumin: 2.1 g/dL — ABNORMAL LOW (ref 3.5–5.0)
Albumin: 2 g/dL — ABNORMAL LOW (ref 3.5–5.0)
Anion gap: 11 (ref 5–15)
Anion gap: 9 (ref 5–15)
BUN: 16 mg/dL (ref 8–23)
BUN: 18 mg/dL (ref 8–23)
CO2: 22 mmol/L (ref 22–32)
CO2: 24 mmol/L (ref 22–32)
Calcium: 8.2 mg/dL — ABNORMAL LOW (ref 8.9–10.3)
Calcium: 7.8 mg/dL — ABNORMAL LOW (ref 8.9–10.3)
Chloride: 101 mmol/L (ref 98–111)
Chloride: 102 mmol/L (ref 98–111)
Creatinine, Ser: 2.13 mg/dL — ABNORMAL HIGH (ref 0.44–1.00)
Creatinine, Ser: 2.04 mg/dL — ABNORMAL HIGH (ref 0.44–1.00)
GFR, Estimated: 26 mL/min — ABNORMAL LOW (ref >60)
GFR, Estimated: 27 mL/min — ABNORMAL LOW (ref >60)
Glucose, Bld: 262 mg/dL — ABNORMAL HIGH (ref 70–99)
Glucose, Bld: 291 mg/dL — ABNORMAL HIGH (ref 70–99)
Phosphorus: 2.7 mg/dL (ref 2.5–4.6)
Phosphorus: 1.9 mg/dL — ABNORMAL LOW (ref 2.5–4.6)
Potassium: 4.6 mmol/L (ref 3.5–5.1)
Potassium: 4.3 mmol/L (ref 3.5–5.1)
Sodium: 134 mmol/L — ABNORMAL LOW (ref 135–145)
Sodium: 135 mmol/L (ref 135–145)

## 2020-10-31 LAB — CBC
HCT: 28.2 % — ABNORMAL LOW (ref 36.0–46.0)
Hemoglobin: 8.4 g/dL — ABNORMAL LOW (ref 12.0–15.0)
MCH: 24.6 pg — ABNORMAL LOW (ref 26.0–34.0)
MCHC: 29.8 g/dL — ABNORMAL LOW (ref 30.0–36.0)
MCV: 82.5 fL (ref 80.0–100.0)
Platelets: 497 10*3/uL — ABNORMAL HIGH (ref 150–400)
RBC: 3.42 MIL/uL — ABNORMAL LOW (ref 3.87–5.11)
RDW: 19.7 % — ABNORMAL HIGH (ref 11.5–15.5)
WBC: 26.4 10*3/uL — ABNORMAL HIGH (ref 4.0–10.5)
nRBC: 1.4 % — ABNORMAL HIGH (ref 0.0–0.2)

## 2020-10-31 LAB — HEMOGLOBIN A1C
Hgb A1c MFr Bld: 6.4 % — ABNORMAL HIGH (ref 4.8–5.6)
Mean Plasma Glucose: 137 mg/dL

## 2020-10-31 LAB — MAGNESIUM: Magnesium: 2.4 mg/dL (ref 1.7–2.4)

## 2020-10-31 LAB — LACTATE DEHYDROGENASE, PLEURAL OR PERITONEAL FLUID: LD, Fluid: 193 U/L — ABNORMAL HIGH (ref 3–23)

## 2020-10-31 LAB — PROTEIN, PLEURAL OR PERITONEAL FLUID: Total protein, fluid: 3.9 g/dL

## 2020-10-31 LAB — HEPARIN LEVEL (UNFRACTIONATED): Heparin Unfractionated: 0.34 IU/mL (ref 0.30–0.70)

## 2020-10-31 LAB — LACTATE DEHYDROGENASE: LDH: 272 U/L — ABNORMAL HIGH (ref 98–192)

## 2020-10-31 LAB — APTT: aPTT: 91 seconds — ABNORMAL HIGH (ref 24–36)

## 2020-10-31 LAB — PROTEIN, TOTAL: Total Protein: 6.5 g/dL (ref 6.5–8.1)

## 2020-10-31 MED ORDER — INSULIN REGULAR(HUMAN) IN NACL 100-0.9 UT/100ML-% IV SOLN
INTRAVENOUS | Status: AC
  Administered 2020-11-01: 11 [IU]/h via INTRAVENOUS
  Administered 2020-11-01: 11.5 [IU]/h via INTRAVENOUS
  Filled 2020-10-31 (×2): qty 100

## 2020-10-31 MED ORDER — INSULIN ASPART 100 UNIT/ML IJ SOLN: 3.0000 [IU] | INTRAMUSCULAR | Status: DC

## 2020-10-31 MED ORDER — SODIUM CHLORIDE 0.9% FLUSH
10.0000 mL | Freq: Three times a day (TID) | INTRAVENOUS | Status: DC
  Administered 2020-11-01 – 2020-11-02 (×4): 10 mL

## 2020-10-31 MED ORDER — INSULIN ASPART 100 UNIT/ML IJ SOLN
4.0000 [IU] | INTRAMUSCULAR | Status: DC
  Administered 2020-10-31 (×3): 4 [IU] via SUBCUTANEOUS

## 2020-10-31 MED ORDER — DEXTROSE 10 % IV SOLN: INTRAVENOUS | Status: DC

## 2020-10-31 MED ORDER — INSULIN ASPART 100 UNIT/ML IJ SOLN: 4.0000 [IU] | INTRAMUSCULAR | Status: DC

## 2020-10-31 MED ORDER — POTASSIUM PHOSPHATES 15 MMOLE/5ML IV SOLN
20.0000 mmol | Freq: Once | INTRAVENOUS | Status: AC
  Administered 2020-10-31: 20 mmol via INTRAVENOUS
  Filled 2020-10-31: qty 6.67

## 2020-10-31 MED ORDER — DOCUSATE SODIUM 50 MG/5ML PO LIQD
100.0000 mg | Freq: Every day | ORAL | Status: DC
  Administered 2020-11-01 – 2020-11-06 (×6): 100 mg
  Filled 2020-10-31 (×6): qty 10

## 2020-10-31 MED ORDER — CHLORHEXIDINE GLUCONATE CLOTH 2 % EX PADS
6.0000 | MEDICATED_PAD | Freq: Every day | CUTANEOUS | Status: DC
  Administered 2020-11-01 – 2020-11-06 (×6): 6 via TOPICAL

## 2020-10-31 MED ORDER — DEXTROSE 50 % IV SOLN: 0.0000 mL | INTRAVENOUS | Status: DC | PRN

## 2020-10-31 MED ORDER — DARBEPOETIN ALFA 100 MCG/0.5ML IJ SOSY
100.0000 ug | PREFILLED_SYRINGE | INTRAMUSCULAR | Status: DC
  Filled 2020-10-31 (×2): qty 0.5

## 2020-10-31 MED ORDER — INSULIN ASPART 100 UNIT/ML IJ SOLN
3.0000 [IU] | INTRAMUSCULAR | Status: DC
  Administered 2020-10-31 (×3): 9 [IU] via SUBCUTANEOUS

## 2020-10-31 MED ORDER — INSULIN DETEMIR 100 UNIT/ML ~~LOC~~ SOLN
8.0000 [IU] | Freq: Every day | SUBCUTANEOUS | Status: DC
  Administered 2020-10-31: 8 [IU] via SUBCUTANEOUS
  Filled 2020-10-31: qty 0.08

## 2020-10-31 MED ORDER — INSULIN ASPART 100 UNIT/ML IV SOLN
5.0000 [IU] | Freq: Once | INTRAVENOUS | Status: AC
  Administered 2020-10-31: 5 [IU] via INTRAVENOUS

## 2020-10-31 MED ORDER — DOCUSATE SODIUM 50 MG/5ML PO LIQD
100.0000 mg | Freq: Every day | ORAL | Status: DC
  Administered 2020-10-31: 100 mg via ORAL
  Filled 2020-10-31: qty 10

## 2020-10-31 MED ORDER — POLYETHYLENE GLYCOL 3350 17 G PO PACK
17.0000 g | PACK | Freq: Every day | ORAL | Status: DC
  Administered 2020-11-01 – 2020-11-05 (×5): 17 g
  Filled 2020-10-31 (×5): qty 1

## 2020-10-31 MED ORDER — POLYETHYLENE GLYCOL 3350 17 G PO PACK
17.0000 g | PACK | Freq: Every day | ORAL | Status: DC
  Administered 2020-10-31: 17 g via ORAL
  Filled 2020-10-31: qty 1

## 2020-10-31 MED ORDER — INSULIN DETEMIR 100 UNIT/ML ~~LOC~~ SOLN
10.0000 [IU] | Freq: Two times a day (BID) | SUBCUTANEOUS | Status: DC
  Administered 2020-10-31: 10 [IU] via SUBCUTANEOUS
  Filled 2020-10-31 (×2): qty 0.1

## 2020-10-31 NOTE — Plan of Care (Signed)
  Problem: Pain Managment: Goal: General experience of comfort will improve Outcome: Progressing   Problem: Safety: Goal: Ability to remain free from injury will improve Outcome: Progressing   Problem: Skin Integrity: Goal: Risk for impaired skin integrity will decrease Outcome: Progressing   Problem: Activity: Goal: Ability to tolerate increased activity will improve Outcome: Progressing   Problem: Respiratory: Goal: Ability to maintain a clear airway and adequate ventilation will improve Outcome: Progressing

## 2020-10-31 NOTE — Procedures (Signed)
Arterial Catheter Insertion Procedure Note  Christy Zhang  [id number]  June 05, 1955  Date:10/31/20  Time:6:10 PM    Provider Performing: Mick Sell    Procedure: Insertion of Arterial Line [phone]) with US guidance [id number])   Indication(s) Blood pressure monitoring and/or need for frequent ABGs  Consent Unable to obtain consent due to emergent nature of procedure.  Anesthesia None   Time Out Verified patient identification, verified procedure, site/side was marked, verified correct patient position, special equipment/implants available, medications/allergies/relevant history reviewed, required imaging and test results available.   Sterile Technique Maximal sterile technique including full sterile barrier drape, hand hygiene, sterile gown, sterile gloves, mask, hair covering, sterile ultrasound probe cover (if used).   Procedure Description Area of catheter insertion was cleaned with chlorhexidine and draped in sterile fashion. With real-time ultrasound guidance an arterial catheter was placed into the left femoral artery.  Appropriate arterial tracings confirmed on monitor.     Complications/Tolerance None; patient tolerated the procedure well.   EBL Minimal   Specimen(s) None  [name] 10/31/2020, 6:10 PM  Please see [email or web address] for pager details.  From 7A-7P if no response, please call [phone]. After hours, please call ELink [phone].

## 2020-10-31 NOTE — Progress Notes (Signed)
eLink Physician-Brief Progress Note Patient Name: MARCHELE ZHENG DOB: [birth year] MRN: [id number]   Date of Service  10/31/2020  HPI/Events of Note  Notified of glucose >200 On Levemir 8 q hs and low dose SSI Tolerating tube feeding  eICU Interventions  Added AM dose Levemir 8 units     Intervention Category Intermediate Interventions: Hyperglycemia - evaluation and treatment  Shona Needles Secret Kristensen 10/31/2020, 4:11 AM

## 2020-10-31 NOTE — Plan of Care (Signed)
  Problem: Nutrition: Goal: Adequate nutrition will be maintained Outcome: Progressing   Problem: Safety: Goal: Ability to remain free from injury will improve Outcome: Progressing   Problem: Skin Integrity: Goal: Risk for impaired skin integrity will decrease Outcome: Progressing   Problem: Activity: Goal: Ability to tolerate increased activity will improve Outcome: Progressing   Problem: Respiratory: Goal: Ability to maintain a clear airway and adequate ventilation will improve Outcome: Progressing

## 2020-10-31 NOTE — Progress Notes (Signed)
eLink Physician-Brief Progress Note Patient Name: Christy Zhang DOB: 06-15-1955 MRN: [id number]   Date of Service  10/31/2020  HPI/Events of Note  Patient with sub-optimal glycemic control despite SQ Novolog and Levemir, blood sugar currently 314 mg / dl.  eICU Interventions  Novolog 5 units iv x 1 ordered. CBG hourly x 4 ordered.        Kerry Kass Vinson Tietze 10/31/2020, 9:07 PM

## 2020-10-31 NOTE — Progress Notes (Signed)
NAME:  CHALONDA KODA, MRN:  [id number], DOB:  04-05-1956, LOS: 4 ADMISSION DATE:  10/27/2020, CONSULTATION DATE:  7/3 REFERRING MD:  Tyrell Antonio, CHIEF COMPLAINT:  dyspnea   Brief History of Present Illness:  65 y/o female admitted for presumed pneumonia and hypotension, required intubation, mechanical ventilation and CRRT in the setting of septic shock from pneumonia cauting acute respiratory failure with hypoxemia and AKI.  Pertinent  Medical History  CHF CKD T2DM ESRD HLD HTN  Significant Hospital Events: Including procedures, antibiotic start and stop dates in addition to other pertinent events   Admitted to hospital on 7/1 7/1 cefepime >  7/1 flagyl x1 7/1 vancomycin>  Admitted to ICU on 10/29/20 for CRRT 7/3 - brief PEA with bearing down while agitated. 7/3 ETT 7/3 left radial arterial line 7/3 L IJ HD cath   Interim History / Subjective:  Periods of atrial fibrillation with hypotension on 7/5 Added vasopressin on 7/4, norepinephrine down to 12 mcg/min Fentanyl increased overnight  Objective   Blood pressure (!) 147/57, pulse 76, temperature 97.9 F (36.6 C), temperature source Esophageal, resp. rate (!) 24, height '5\' 4"'$  (1.626 m), weight 92 kg, SpO2 99 %.    Vent Mode: PRVC FiO2 (%):  [40 %] 40 % Set Rate:  [24 bmp] 24 bmp Vt Set:  [440 mL] 440 mL PEEP:  [8 cmH20] 8 cmH20 Plateau Pressure:  [24 cmH20-25 cmH20] 24 cmH20   Intake/Output Summary (Last 24 hours) at 10/31/2020 1040 Last data filed at 10/31/2020 1000 Gross per 24 hour  Intake 3851.21 ml  Output 6154 ml  Net -2302.79 ml   Filed Weights   10/29/20 0337 10/30/20 0157 10/31/20 0500  Weight: 96.6 kg 95 kg 92 kg    Examination: General:  In bed on vent HENT: NCAT ETT in place PULM: CTA on right, diminished on left, vent supported breathing CV: RRR, no mgr GI: BS+, soft, nontender MSK: bilateral BKA Neuro: sedated on vent  7/1 blood culture > negative 7/1 urine culture > multiple species 7/2  SARS cov 2/flu > neg  CXR with left pleural effusion ett in place personally reviewed  Resolved Hospital Problem list     Assessment & Plan:  Acute respiratory failure with hypercarbia and hypoxemia due to severe community acquired pneumonia Full mechanical vent support VAP prevention Daily WUA/SBT Ultrasound left chest today> is there an effusion? If so then thoracentesis vs chest tube  Need for sedation for mechanical ventilation RASS -2 Add PAD protocol Continue fentanyl/propofol for now per PAD  Constipation Start bowel regimen today  Septic shock due to CAP Wean off levophed for MAP > 65 Continue vasopressin  ESRD Continue CRRT with volume removal Monitor BMET and UOP Replace electrolytes as needed  PAD Statin plavix  Atrial filbrillation Tele Continue amiodarone infusion Heparin infusion  DM2 with hyperglycemia Stop levemir Add tube feeding coverage Increase sliding scale coverage   Best Practice (right click and "Reselect all SmartList Selections" daily)   Diet/type: tubefeeds DVT prophylaxis: systemic heparin GI prophylaxis: PPI Lines: yes and it is still needed Foley:  N/A Code Status:  full code Last date of multidisciplinary goals of care discussion [7/3]  Labs   CBC: Recent Labs  Lab 10/27/20 1851 10/28/20 0358 10/29/20 0443 10/29/20 0545 10/29/20 1629 10/29/20 1630 10/30/20 0422 10/31/20 0323  WBC 18.7* 17.5*  --   --  25.5*  --  27.4* 26.4*  NEUTROABS 15.7*  --   --   --   --   --   --   --  HGB 9.5* 8.8*   < > 10.2* 8.0* 10.2* 8.6* 8.4*  HCT 32.0* 29.4*   < > 30.0* 27.1* 30.0* 28.7* 28.2*  MCV 81.8 82.4  --   --  83.4  --  81.8 82.5  PLT 736* 649*  --   --  543*  --  556* 497*   < > = values in this interval not displayed.    Basic Metabolic Panel: Recent Labs  Lab 10/28/20 0358 10/29/20 0443 10/29/20 1528 10/29/20 1600 10/29/20 1629 10/29/20 1630 10/30/20 0422 10/30/20 1600 10/30/20 1610 10/31/20 0323  NA 131*    < > 132*  --   --  133* 131* 133*  --  134*  K 3.9   < > 3.9  --   --  3.9 4.7 4.3  --  4.6  CL 95*  --  100  --   --   --  100 100  --  101  CO2 25  --  20*  --   --   --  22 22  --  22  GLUCOSE 207*  --  147*  --   --   --  199* 198*  --  262*  BUN 25*  --  22  --   --   --  17 16  --  16  CREATININE 6.31*  --  5.00*  --   --   --  3.42* 2.64*  --  2.13*  CALCIUM 7.6*  --  7.6*  --   --   --  7.9* 8.0*  --  8.2*  MG 1.6*  --   --   --  2.0  --  2.2  --  2.3 2.4  PHOS 5.7*  --   --  4.3  --   --  2.9 2.1*  --  2.7   < > = values in this interval not displayed.   GFR: Estimated Creatinine Clearance: 29.7 mL/min (A) (by C-G formula based on SCr of 2.13 mg/dL (H)). Recent Labs  Lab 10/27/20 1927 10/27/20 2150 10/28/20 0133 10/28/20 0358 10/29/20 1629 10/30/20 0422 10/31/20 0323  PROCALCITON  --   --   --  4.20  --   --   --   WBC  --   --   --  17.5* 25.5* 27.4* 26.4*  LATICACIDVEN 2.9* 2.0* 2.0* 1.3  --   --   --     Liver Function Tests: Recent Labs  Lab 10/27/20 1851 10/28/20 0358 10/30/20 0422 10/30/20 1600 10/31/20 0323  AST 30 37  --   --   --   ALT 18 23  --   --   --   ALKPHOS 86 63  --   --   --   BILITOT 0.5 0.5  --   --   --   PROT 7.6 6.7  --   --   --   ALBUMIN 2.8* 2.5* 2.2* 2.1* 2.1*   No results for input(s): LIPASE, AMYLASE in the last 168 hours. No results for input(s): AMMONIA in the last 168 hours.  ABG    Component Value Date/Time   PHART 7.302 (L) 10/29/2020 1630   PCO2ART 45.6 10/29/2020 1630   PO2ART 249 (H) 10/29/2020 1630   HCO3 23.0 10/29/2020 1630   TCO2 24 10/29/2020 1630   ACIDBASEDEF 4.0 (H) 10/29/2020 1630   O2SAT 100.0 10/29/2020 1630     Coagulation Profile: Recent Labs  Lab 10/27/20 1851 10/28/20 0358  INR  1.5* 1.5*    Cardiac Enzymes: No results for input(s): CKTOTAL, CKMB, CKMBINDEX, TROPONINI in the last 168 hours.  HbA1C: HB A1C (BAYER DCA - WAIVED)  Date/Time Value Ref Range Status  04/06/2020 11:05 AM 5.4  <7.0 % Final    Comment:                                          Diabetic Adult            <7.0                                       Healthy Adult        4.3 - 5.7                                                           (DCCT/NGSP) American Diabetes Association's Summary of Glycemic Recommendations for Adults with Diabetes: Hemoglobin A1c <7.0%. More stringent glycemic goals (A1c <6.0%) may further reduce complications at the cost of increased risk of hypoglycemia.   01/06/2020 10:13 AM 6.2 <7.0 % Final    Comment:                                          Diabetic Adult            <7.0                                       Healthy Adult        4.3 - 5.7                                                           (DCCT/NGSP) American Diabetes Association's Summary of Glycemic Recommendations for Adults with Diabetes: Hemoglobin A1c <7.0%. More stringent glycemic goals (A1c <6.0%) may further reduce complications at the cost of increased risk of hypoglycemia.    Hgb A1c MFr Bld  Date/Time Value Ref Range Status  10/27/2020 06:48 PM 6.4 (H) 4.8 - 5.6 % Final    Comment:    (NOTE)         Prediabetes: 5.7 - 6.4         Diabetes: >6.4         Glycemic control for adults with diabetes: <7.0   08/09/2020 05:23 AM 6.5 (H) 4.8 - 5.6 % Final    Comment:    (NOTE) Pre diabetes:          5.7%-6.4%  Diabetes:              >6.4%  Glycemic control for   <7.0% adults with diabetes     CBG: Recent Labs  Lab 10/30/20 1135 10/30/20 1603 10/30/20 1957 10/31/20 0318 10/31/20 0802  GLUCAP  230* 198* 204* 239* 248*    Critical care time: 34 minutes    Roselie Awkward, MD [facility] Pager: [phone] Cell: [phone] If no response, please call [phone] until 7pm After 7:00 pm call Elink  [phone]

## 2020-10-31 NOTE — Progress Notes (Signed)
Progress Note  Patient Name: Christy Zhang Date of Encounter: 10/31/2020  [facility] Cardiologist: Dr Percival Spanish  Subjective   Intubated and sedated  Inpatient Medications    Scheduled Meds:  aspirin  81 mg Per Tube Daily   atorvastatin  40 mg Per Tube Daily   B-complex with vitamin C  1 tablet Per Tube Daily   chlorhexidine gluconate (MEDLINE KIT)  15 mL Mouth Rinse BID   [START ON 11/01/2020] Chlorhexidine Gluconate Cloth  6 each Topical Q0600   clopidogrel  75 mg Per Tube Daily   feeding supplement (PROSource TF)  90 mL Per Tube TID   fentaNYL (SUBLIMAZE) injection  100 mcg Intravenous Once   fentaNYL (SUBLIMAZE) injection  50 mcg Intravenous Once   guaiFENesin  15 mL Per Tube Q6H   insulin aspart  0-6 Units Subcutaneous Q4H   insulin detemir  8 Units Subcutaneous QHS   insulin detemir  8 Units Subcutaneous Daily   ipratropium-albuterol  3 mL Nebulization TID   mouth rinse  15 mL Mouth Rinse 10 times per day   pantoprazole (PROTONIX) IV  40 mg Intravenous Q24H   sodium chloride flush  10-40 mL Intracatheter Q12H   Continuous Infusions:   prismasol BGK 4/2.5 400 mL/hr at 10/30/20 1938    prismasol BGK 4/2.5 200 mL/hr at 10/30/20 0753   amiodarone 30 mg/hr (10/31/20 0700)   ceFEPime (MAXIPIME) IV Stopped (10/30/20 2226)   feeding supplement (VITAL 1.5 CAL) 45 mL/hr at 10/31/20 0600   fentaNYL infusion INTRAVENOUS 200 mcg/hr (10/31/20 0700)   heparin 1,250 Units/hr (10/31/20 0700)   norepinephrine (LEVOPHED) Adult infusion 14 mcg/min (10/31/20 0700)   prismasol BGK 4/2.5 1,800 mL/hr at 10/31/20 0616   propofol (DIPRIVAN) infusion 30 mcg/kg/min (10/31/20 0700)   vancomycin Stopped (10/30/20 1807)   vasopressin 0.03 Units/min (10/31/20 0700)   PRN Meds: acetaminophen (TYLENOL) oral liquid 160 mg/5 mL, albuterol, alteplase, heparin, hydrALAZINE, metoprolol tartrate, midazolam, sodium chloride, sodium chloride flush   Vital Signs    Vitals:   10/31/20 0615  10/31/20 0630 10/31/20 0645 10/31/20 0700  BP:      Pulse: (!) 105 71 75 (!) 106  Resp: (!) 24 (!) 24 (!) 24 (!) 24  Temp:    97.9 F (36.6 C)  TempSrc:    Esophageal  SpO2: 100% 100% 100% 99%  Weight:      Height:        Intake/Output Summary (Last 24 hours) at 10/31/2020 0802 Last data filed at 10/31/2020 0700 Gross per 24 hour  Intake 3644.42 ml  Output 5931 ml  Net -2286.58 ml   Last 3 Weights 10/31/2020 10/30/2020 10/29/2020  Weight (lbs) 202 lb 13.2 oz 209 lb 7 oz 212 lb 15.4 oz  Weight (kg) 92 kg 95 kg 96.6 kg      Telemetry    Atrial fibrillation- Personally Reviewed   Physical Exam   GEN: Intubated and sedated Neck: No JVD Cardiac: irregular Respiratory: CTA anteriorly GI: Soft, ND MS: s/p bilateral amputation Neuro:  Intubated and sedated Psych: Not assessed  Labs    High Sensitivity Troponin:   Recent Labs  Lab 10/27/20 2223 10/28/20 0130 10/28/20 0358 10/28/20 0552  TROPONINIHS 79* 82* 109* 107*      Chemistry Recent Labs  Lab 10/27/20 1851 10/28/20 0358 10/29/20 0443 10/30/20 0422 10/30/20 1600 10/31/20 0323  NA 130* 131*   < > 131* 133* 134*  K 4.0 3.9   < > 4.7 4.3 4.6  CL  90* 95*   < > 100 100 101  CO2 25 25   < > _0 GLUCOSE 230* 207*   < > 199* 198* 262*  BUN 25* 25*   < > _1 CREATININE 6.44* 6.31*   < > 3.42* 2.64* 2.13*  CALCIUM 8.3* 7.6*   < > 7.9* 8.0* 8.2*  PROT 7.6 6.7  --   --   --   --   ALBUMIN 2.8* 2.5*  --  2.2* 2.1* 2.1*  AST 30 37  --   --   --   --   ALT 18 23  --   --   --   --   ALKPHOS 86 63  --   --   --   --   BILITOT 0.5 0.5  --   --   --   --   GFRNONAA 7* 7*   < > 14* 20* 26*  ANIONGAP 15 11   < > _2 < > = values in this interval not displayed.     Hematology Recent Labs  Lab 10/29/20 1629 10/29/20 1630 10/30/20 0422 10/31/20 0323  WBC 25.5*  --  27.4* 26.4*  RBC 3.25*  --  3.51* 3.42*  HGB 8.0* 10.2* 8.6* 8.4*  HCT 27.1* 30.0* 28.7* 28.2*  MCV 83.4  --  81.8 82.5  MCH 24.6*   --  24.5* 24.6*  MCHC 29.5*  --  30.0 29.8*  RDW 18.8*  --  19.6* 19.7*  PLT 543*  --  556* 497*     Radiology    DG Chest 1 View  Result Date: 10/29/2020 CLINICAL DATA:  Cardiac arrest. EXAM: CHEST  1 VIEW COMPARISON:  Earlier today. FINDINGS: Endotracheal tube tip 2.4 cm from the carina. Enteric tube in place with tip and side-port below the diaphragm. Left-sided dialysis catheter is left central line in the SVC. Stable cardiomegaly. Unchanged mediastinal contours. Improving aeration with decreasing bilateral pulmonary opacities. Improving bilateral pleural effusions, left greater than right. Persistent retrocardiac opacity. No pneumothorax. Vascular stent in the right axilla. IMPRESSION: 1. Endotracheal tube tip 2.4 cm from the carina. Stable enteric tube and left central line. 2. Improving aeration with decreasing bilateral pulmonary opacities, likely improving pulmonary edema. Improving pleural effusions. Persistent dense retrocardiac opacity. 3. Stable cardiomegaly. Electronically Signed   By: Keith Rake M.D.   On: 10/29/2020 16:11   DG Abd 1 View  Result Date: 10/29/2020 CLINICAL DATA:  Status post or gastric feeding tube placement. EXAM: ABDOMEN - 1 VIEW COMPARISON:  Chest x-ray today FINDINGS: Enteric tube has been placed, tip overlying the level of the proximal to mid stomach. There is persistent dense opacity in the LEFT LOWER lobe. Paucity of bowel gas. IMPRESSION: Enteric tube tip overlying the level of the stomach. Electronically Signed   By: [name] M.D.   On: 10/29/2020 12:15   ECHOCARDIOGRAM COMPLETE  Result Date: 10/29/2020    ECHOCARDIOGRAM REPORT   Patient Name:   Christy Zhang Date of Exam: 10/29/2020 Medical Rec #:  [id number]         Height:       64.0 in Accession #:    [id number]        Weight:       213.0 lb Date of Birth:  [birth year]         BSA:          2.009 m Patient Age:  65 years          BP:           54/105 mmHg Patient Gender: F                  HR:           101 bpm. Exam Location:  Inpatient Procedure: 2D Echo, Color Doppler and Cardiac Doppler Indications:    R06.9 DOE  History:        Patient has no prior history of Echocardiogram examinations.                 CHF; Risk Factors:Hypertension, Diabetes and Dyslipidemia.  Sonographer:    Raquel Sarna Senior RDCS Referring Phys: [phone] [facility]  Sonographer Comments: Scanned supine on artificial respirator. IMPRESSIONS  1. Left ventricular ejection fraction, by estimation, is 55 to 60%. The left ventricle has normal function. The left ventricle has no regional wall motion abnormalities. There is moderate left ventricular hypertrophy. Left ventricular diastolic parameters are indeterminate.  2. Right ventricular systolic function is normal. The right ventricular size is normal. There is moderately elevated pulmonary artery systolic pressure.  3. A small pericardial effusion is present.  4. The mitral valve is normal in structure. No evidence of mitral valve regurgitation. No evidence of mitral stenosis.  5. The aortic valve is tricuspid. Aortic valve regurgitation is not visualized. No aortic stenosis is present.  6. The inferior vena cava is dilated in size with <50% respiratory variability, suggesting right atrial pressure of 15 mmHg. FINDINGS  Left Ventricle: Left ventricular ejection fraction, by estimation, is 55 to 60%. The left ventricle has normal function. The left ventricle has no regional wall motion abnormalities. The left ventricular internal cavity size was normal in size. There is  moderate left ventricular hypertrophy. Left ventricular diastolic parameters are indeterminate. Right Ventricle: The right ventricular size is normal.Right ventricular systolic function is normal. There is moderately elevated pulmonary artery systolic pressure. The tricuspid regurgitant velocity is 3.09 m/s, and with an assumed right atrial pressure of 15 mmHg, the estimated right ventricular systolic pressure is  66.4 mmHg. Left Atrium: Left atrial size was normal in size. Right Atrium: Right atrial size was normal in size. Pericardium: A small pericardial effusion is present. Mitral Valve: The mitral valve is normal in structure. No evidence of mitral valve regurgitation. No evidence of mitral valve stenosis. Tricuspid Valve: The tricuspid valve is normal in structure. Tricuspid valve regurgitation is mild . No evidence of tricuspid stenosis. Aortic Valve: The aortic valve is tricuspid. Aortic valve regurgitation is not visualized. No aortic stenosis is present. Pulmonic Valve: The pulmonic valve was normal in structure. Pulmonic valve regurgitation is trivial. No evidence of pulmonic stenosis. Aorta: The aortic root is normal in size and structure. Venous: The inferior vena cava is dilated in size with less than 50% respiratory variability, suggesting right atrial pressure of 15 mmHg. IAS/Shunts: No atrial level shunt detected by color flow Doppler.  LEFT VENTRICLE PLAX 2D LVIDd:         3.30 cm  Diastology LVIDs:         2.10 cm  LV e' medial:    5.22 cm/s LV PW:         1.50 cm  LV E/e' medial:  14.2 LV IVS:        1.30 cm  LV e' lateral:   6.85 cm/s LVOT diam:     1.80 cm  LV E/e' lateral: 10.8 LV SV:  31 LV SV Index:   15 LVOT Area:     2.54 cm  RIGHT VENTRICLE RV S prime:     9.36 cm/s TAPSE (M-mode): 1.1 cm LEFT ATRIUM           Index       RIGHT ATRIUM           Index LA diam:      4.10 cm 2.04 cm/m  RA Area:     13.60 cm LA Vol (A2C): 49.1 ml 24.44 ml/m RA Volume:   37.10 ml  18.46 ml/m LA Vol (A4C): 50.4 ml 25.08 ml/m  AORTIC VALVE LVOT Vmax:   76.65 cm/s LVOT Vmean:  49.000 cm/s LVOT VTI:    0.122 m  AORTA Ao Root diam: 2.80 cm Ao Asc diam:  3.00 cm MITRAL VALVE               TRICUSPID VALVE MV Area (PHT): 3.74 cm    TR Peak grad:   38.2 mmHg MV Decel Time: 203 msec    TR Vmax:        309.00 cm/s MV E velocity: 73.90 cm/s MV A velocity: 46.10 cm/s  SHUNTS MV E/A ratio:  1.60        Systemic VTI:   0.12 m                            Systemic Diam: 1.80 cm Kirk Ruths MD Electronically signed by Kirk Ruths MD Signature Date/Time: 10/29/2020/10:42:54 AM    Final      Patient Profile     65 y.o. female admitted with pneumonia and respiratory failure being evaluated for atrial fibrillation.  Echocardiogram showed normal LV function, moderate left ventricular hypertrophy, moderate pulmonary hypertension, small pericardial effusion.  Assessment & Plan    1 paroxysmal atrial fibrillation-patient is back in atrial fibrillation this morning with rate high normal to mildly elevated.  Continue IV amiodarone and IV heparin.  She will need long-term anticoagulation at time of discharge.  2 respiratory failure-felt secondary to pneumonia and acute on chronic diastolic congestive heart failure.  Vent management per critical care medicine.  Continue volume removal as tolerated with CRRT.  3 shock-wean pressors as tolerated.  We will try and maintain sinus rhythm with amiodarone as her hemodynamics are worse when she is in atrial fibrillation.  4 end-stage renal disease-nephrology managing.    For questions or updates, please contact [place] Please consult [email or web address] for contact info under        Signed, Kirk Ruths, MD  10/31/2020, 8:02 AM

## 2020-10-31 NOTE — Progress Notes (Signed)
[facility] NEPHROLOGY PROGRESS NOTE  Assessment/ Plan: Pt is a 65 y.o. yo female  with history of HTN, HLD, peripheral vascular disease status post bilateral BKA, DM, anemia, ESRD on HD MWF at [facility] presented with cough, shortness of breath and generalized weakness, seen as a consultation for the management of dialysis.  Dialysis Orders from previous admission:  [facility] - MWF 4 hrs 400/500 97 kg 2.0K/2.5 Ca 16g needles -Heparin 1000 units initial bolus-heparin pump 1000 units/hr stop 1 hour prior to end of tx  -Epogen 14000 units IV TIW -Hectorol 1.5 mcg IV TIW  #Septic shock due to left lung pneumonia: Patient was transferred to [facility].  Vent and pressor support per CCM.  Currently on vancomycin and cefepime.  #Vent dependent respiratory failure due to both pneumonia and pulm edema.  Antibiotics as above and volume management with CRRT.  # ESRD MWF schedule.  Now on CRRT because of hemodynamic instability.  Tolerating CRRT well.  On IV heparin for anticoagulation.  UF as tolerated.  Discussed with the nurse.  #A. fib with RVR: Currently on amiodarone and heparin.  Cardiology is following.   # Anemia: Hemoglobin 8.4 today.  Continue monitor.  Resume ESA.  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  Resume sevelamer when able to take orally. Phos ok  #Hyponatremia, hypervolemic: improved. Managed with CRRT.  Gean Quint, MD [facility]  Subjective: Seen and examined on CRRT in ICU.  Now on vaso, trying to come off on levo. Fio2 40%, net neg around 2.3L no acute events. Discussed with icu rn bedside. Objective Vital signs in last 24 hours: Vitals:   10/31/20 0630 10/31/20 0645 10/31/20 0700 10/31/20 0800  BP:    118/65  Pulse: 71 75 (!) 106 (!) 104  Resp: (!) 24 (!) 24 (!) 24 (!) 24  Temp:   97.9 F (36.6 C) 97.9 F (36.6 C)  TempSrc:   Esophageal Esophageal  SpO2: 100% 100% 99% 100%  Weight:      Height:       Weight change: -3  kg  Intake/Output Summary (Last 24 hours) at 10/31/2020 0828 Last data filed at 10/31/2020 0800 Gross per 24 hour  Intake 3777.59 ml  Output 6181 ml  Net -2403.41 ml       Labs: Basic Metabolic Panel: Recent Labs  Lab 10/30/20 0422 10/30/20 1600 10/31/20 0323  NA 131* 133* 134*  K 4.7 4.3 4.6  CL 100 100 101  CO2 22 22 22   GLUCOSE 199* 198* 262*  BUN 17 16 16   CREATININE 3.42* 2.64* 2.13*  CALCIUM 7.9* 8.0* 8.2*  PHOS 2.9 2.1* 2.7   Liver Function Tests: Recent Labs  Lab 10/27/20 1851 10/28/20 0358 10/30/20 0422 10/30/20 1600 10/31/20 0323  AST 30 37  --   --   --   ALT 18 23  --   --   --   ALKPHOS 86 63  --   --   --   BILITOT 0.5 0.5  --   --   --   PROT 7.6 6.7  --   --   --   ALBUMIN 2.8* 2.5* 2.2* 2.1* 2.1*   No results for input(s): LIPASE, AMYLASE in the last 168 hours. No results for input(s): AMMONIA in the last 168 hours. CBC: Recent Labs  Lab 10/27/20 1851 10/28/20 0358 10/29/20 0443 10/29/20 1629 10/29/20 1630 10/30/20 0422 10/31/20 0323  WBC 18.7* 17.5*  --  25.5*  --  27.4* 26.4*  NEUTROABS 15.7*  --   --   --   --   --   --   HGB 9.5* 8.8*   < > 8.0* 10.2* 8.6* 8.4*  HCT 32.0* 29.4*   < > 27.1* 30.0* 28.7* 28.2*  MCV 81.8 82.4  --  83.4  --  81.8 82.5  PLT 736* 649*  --  543*  --  556* 497*   < > = values in this interval not displayed.   Cardiac Enzymes: No results for input(s): CKTOTAL, CKMB, CKMBINDEX, TROPONINI in the last 168 hours. CBG: Recent Labs  Lab 10/30/20 1135 10/30/20 1603 10/30/20 1957 10/31/20 0318 10/31/20 0802  GLUCAP 230* 198* 204* 239* 248*    Iron Studies: No results for input(s): IRON, TIBC, TRANSFERRIN, FERRITIN in the last 72 hours. Studies/Results: DG Chest 1 View  Result Date: 10/29/2020 CLINICAL DATA:  Cardiac arrest. EXAM: CHEST  1 VIEW COMPARISON:  Earlier today. FINDINGS: Endotracheal tube tip 2.4 cm from the carina. Enteric tube in place with tip and side-port below the diaphragm. Left-sided  dialysis catheter is left central line in the SVC. Stable cardiomegaly. Unchanged mediastinal contours. Improving aeration with decreasing bilateral pulmonary opacities. Improving bilateral pleural effusions, left greater than right. Persistent retrocardiac opacity. No pneumothorax. Vascular stent in the right axilla. IMPRESSION: 1. Endotracheal tube tip 2.4 cm from the carina. Stable enteric tube and left central line. 2. Improving aeration with decreasing bilateral pulmonary opacities, likely improving pulmonary edema. Improving pleural effusions. Persistent dense retrocardiac opacity. 3. Stable cardiomegaly. Electronically Signed   By: Keith Rake M.D.   On: 10/29/2020 16:11   DG Abd 1 View  Result Date: 10/29/2020 CLINICAL DATA:  Status post or gastric feeding tube placement. EXAM: ABDOMEN - 1 VIEW COMPARISON:  Chest x-ray today FINDINGS: Enteric tube has been placed, tip overlying the level of the proximal to mid stomach. There is persistent dense opacity in the LEFT LOWER lobe. Paucity of bowel gas. IMPRESSION: Enteric tube tip overlying the level of the stomach. Electronically Signed   By: [name] M.D.   On: 10/29/2020 12:15   ECHOCARDIOGRAM COMPLETE  Result Date: 10/29/2020    ECHOCARDIOGRAM REPORT   Patient Name:   DELLAR TRABER Date of Exam: 10/29/2020 Medical Rec #:  [id number]         Height:       64.0 in Accession #:    [id number]        Weight:       213.0 lb Date of Birth:  [birth year]         BSA:          2.009 m Patient Age:    65 years          BP:           54/105 mmHg Patient Gender: F                 HR:           101 bpm. Exam Location:  Inpatient Procedure: 2D Echo, Color Doppler and Cardiac Doppler Indications:    R06.9 DOE  History:        Patient has no prior history of Echocardiogram examinations.                 CHF; Risk Factors:Hypertension, Diabetes and Dyslipidemia.  Sonographer:    Raquel Sarna Senior RDCS Referring Phys: [phone] [facility]  Sonographer Comments:  Scanned supine on artificial respirator. IMPRESSIONS  1. Left ventricular  ejection fraction, by estimation, is 55 to 60%. The left ventricle has normal function. The left ventricle has no regional wall motion abnormalities. There is moderate left ventricular hypertrophy. Left ventricular diastolic parameters are indeterminate.  2. Right ventricular systolic function is normal. The right ventricular size is normal. There is moderately elevated pulmonary artery systolic pressure.  3. A small pericardial effusion is present.  4. The mitral valve is normal in structure. No evidence of mitral valve regurgitation. No evidence of mitral stenosis.  5. The aortic valve is tricuspid. Aortic valve regurgitation is not visualized. No aortic stenosis is present.  6. The inferior vena cava is dilated in size with <50% respiratory variability, suggesting right atrial pressure of 15 mmHg. FINDINGS  Left Ventricle: Left ventricular ejection fraction, by estimation, is 55 to 60%. The left ventricle has normal function. The left ventricle has no regional wall motion abnormalities. The left ventricular internal cavity size was normal in size. There is  moderate left ventricular hypertrophy. Left ventricular diastolic parameters are indeterminate. Right Ventricle: The right ventricular size is normal.Right ventricular systolic function is normal. There is moderately elevated pulmonary artery systolic pressure. The tricuspid regurgitant velocity is 3.09 m/s, and with an assumed right atrial pressure of 15 mmHg, the estimated right ventricular systolic pressure is 82.0 mmHg. Left Atrium: Left atrial size was normal in size. Right Atrium: Right atrial size was normal in size. Pericardium: A small pericardial effusion is present. Mitral Valve: The mitral valve is normal in structure. No evidence of mitral valve regurgitation. No evidence of mitral valve stenosis. Tricuspid Valve: The tricuspid valve is normal in structure. Tricuspid valve  regurgitation is mild . No evidence of tricuspid stenosis. Aortic Valve: The aortic valve is tricuspid. Aortic valve regurgitation is not visualized. No aortic stenosis is present. Pulmonic Valve: The pulmonic valve was normal in structure. Pulmonic valve regurgitation is trivial. No evidence of pulmonic stenosis. Aorta: The aortic root is normal in size and structure. Venous: The inferior vena cava is dilated in size with less than 50% respiratory variability, suggesting right atrial pressure of 15 mmHg. IAS/Shunts: No atrial level shunt detected by color flow Doppler.  LEFT VENTRICLE PLAX 2D LVIDd:         3.30 cm  Diastology LVIDs:         2.10 cm  LV e' medial:    5.22 cm/s LV PW:         1.50 cm  LV E/e' medial:  14.2 LV IVS:        1.30 cm  LV e' lateral:   6.85 cm/s LVOT diam:     1.80 cm  LV E/e' lateral: 10.8 LV SV:         31 LV SV Index:   15 LVOT Area:     2.54 cm  RIGHT VENTRICLE RV S prime:     9.36 cm/s TAPSE (M-mode): 1.1 cm LEFT ATRIUM           Index       RIGHT ATRIUM           Index LA diam:      4.10 cm 2.04 cm/m  RA Area:     13.60 cm LA Vol (A2C): 49.1 ml 24.44 ml/m RA Volume:   37.10 ml  18.46 ml/m LA Vol (A4C): 50.4 ml 25.08 ml/m  AORTIC VALVE LVOT Vmax:   76.65 cm/s LVOT Vmean:  49.000 cm/s LVOT VTI:    0.122 m  AORTA Ao Root diam: 2.80  cm Ao Asc diam:  3.00 cm MITRAL VALVE               TRICUSPID VALVE MV Area (PHT): 3.74 cm    TR Peak grad:   38.2 mmHg MV Decel Time: 203 msec    TR Vmax:        309.00 cm/s MV E velocity: 73.90 cm/s MV A velocity: 46.10 cm/s  SHUNTS MV E/A ratio:  1.60        Systemic VTI:  0.12 m                            Systemic Diam: 1.80 cm Kirk Ruths MD Electronically signed by Kirk Ruths MD Signature Date/Time: 10/29/2020/10:42:54 AM    Final     Medications: Infusions:   prismasol BGK 4/2.5 400 mL/hr at 10/30/20 1938    prismasol BGK 4/2.5 200 mL/hr at 10/30/20 0753   amiodarone 30 mg/hr (10/31/20 0804)   ceFEPime (MAXIPIME) IV Stopped  (10/30/20 2226)   feeding supplement (VITAL 1.5 CAL) 45 mL/hr at 10/31/20 0600   fentaNYL infusion INTRAVENOUS 200 mcg/hr (10/31/20 0800)   heparin 1,250 Units/hr (10/31/20 0800)   norepinephrine (LEVOPHED) Adult infusion 14 mcg/min (10/31/20 0800)   prismasol BGK 4/2.5 1,800 mL/hr at 10/31/20 0616   propofol (DIPRIVAN) infusion 30 mcg/kg/min (10/31/20 0800)   vancomycin Stopped (10/30/20 1807)   vasopressin 0.03 Units/min (10/31/20 0800)    Scheduled Medications:  aspirin  81 mg Per Tube Daily   atorvastatin  40 mg Per Tube Daily   B-complex with vitamin C  1 tablet Per Tube Daily   chlorhexidine gluconate (MEDLINE KIT)  15 mL Mouth Rinse BID   [START ON 11/01/2020] Chlorhexidine Gluconate Cloth  6 each Topical Q0600   clopidogrel  75 mg Per Tube Daily   feeding supplement (PROSource TF)  90 mL Per Tube TID   fentaNYL (SUBLIMAZE) injection  100 mcg Intravenous Once   fentaNYL (SUBLIMAZE) injection  50 mcg Intravenous Once   guaiFENesin  15 mL Per Tube Q6H   insulin aspart  0-6 Units Subcutaneous Q4H   insulin detemir  8 Units Subcutaneous QHS   insulin detemir  8 Units Subcutaneous Daily   ipratropium-albuterol  3 mL Nebulization TID   mouth rinse  15 mL Mouth Rinse 10 times per day   pantoprazole (PROTONIX) IV  40 mg Intravenous Q24H   sodium chloride flush  10-40 mL Intracatheter Q12H    have reviewed scheduled and prn medications.  Physical Exam: General: Critically ill looking female intubated, sedated Heart:RRR, s1s2 nl Lungs: Coarse breath sound bilateral, vent Abdomen:soft,  non-distended Extremities: Bilateral BKA, no stump edema Dialysis Access: Left IJ temporary HD catheter for CRRT.  AV fistula has good thrill and bruit.  Parke Jandreau 10/31/2020,8:28 AM  LOS: 4 days

## 2020-10-31 NOTE — Procedures (Signed)
Insertion of Chest Tube Procedure Note  Christy Zhang  [id number]  06/09/1955  Date:10/31/20  Time:6:08 PM    Provider Performing: Mick Sell   Procedure: Chest Tube Insertion [id number])  Indication(s) Effusion  Consent Risks of the procedure as well as the alternatives and risks of each were explained to the patient and/or caregiver.  Consent for the procedure was obtained and is signed in the bedside chart  Anesthesia Topical only with 1% lidocaine    Time Out Verified patient identification, verified procedure, site/side was marked, verified correct patient position, special equipment/implants available, medications/allergies/relevant history reviewed, required imaging and test results available.   Sterile Technique Maximal sterile technique including full sterile barrier drape, hand hygiene, sterile gown, sterile gloves, mask, hair covering, sterile ultrasound probe cover (if used).   Procedure Description Ultrasound used to identify appropriate pleural anatomy for placement and overlying skin marked. Area of placement cleaned and draped in sterile fashion.  A 14 French pigtail pleural catheter was placed into the left pleural space using Seldinger technique. Appropriate return of fluid was obtained.  The tube was connected to atrium and placed on -20 cm H2O wall suction.   Complications/Tolerance None; patient tolerated the procedure well. Chest X-ray is ordered to verify placement.   EBL Minimal  Specimen(s) fluid  JD Rexene Agent [facility] 10/31/2020, 6:09 PM  Please see [email or web address] for pager details.  From 7A-7P if no response, please call [phone]. After hours, please call ELink [phone].

## 2020-10-31 NOTE — Progress Notes (Signed)
eLink Physician-Brief Progress Note Patient Name: Christy Zhang DOB: 07-Nov-1955 MRN: [id number]   Date of Service  10/31/2020  HPI/Events of Note  Patient with sub-optimal glycemic control despite adjustments to her regimen.  eICU Interventions  Insulin gtt ordered.        Kerry Kass Stran Raper 10/31/2020, 11:58 PM

## 2020-11-01 DIAGNOSIS — J9601 Acute respiratory failure with hypoxia: Secondary | ICD-10-CM | POA: Diagnosis not present

## 2020-11-01 DIAGNOSIS — J189 Pneumonia, unspecified organism: Secondary | ICD-10-CM | POA: Diagnosis not present

## 2020-11-01 DIAGNOSIS — I480 Paroxysmal atrial fibrillation: Secondary | ICD-10-CM | POA: Diagnosis not present

## 2020-11-01 DIAGNOSIS — D638 Anemia in other chronic diseases classified elsewhere: Secondary | ICD-10-CM | POA: Diagnosis not present

## 2020-11-01 LAB — GLUCOSE, CAPILLARY
Glucose-Capillary: 131 mg/dL — ABNORMAL HIGH (ref 70–99)
Glucose-Capillary: 163 mg/dL — ABNORMAL HIGH (ref 70–99)
Glucose-Capillary: 169 mg/dL — ABNORMAL HIGH (ref 70–99)
Glucose-Capillary: 183 mg/dL — ABNORMAL HIGH (ref 70–99)
Glucose-Capillary: 187 mg/dL — ABNORMAL HIGH (ref 70–99)
Glucose-Capillary: 191 mg/dL — ABNORMAL HIGH (ref 70–99)
Glucose-Capillary: 193 mg/dL — ABNORMAL HIGH (ref 70–99)
Glucose-Capillary: 195 mg/dL — ABNORMAL HIGH (ref 70–99)
Glucose-Capillary: 201 mg/dL — ABNORMAL HIGH (ref 70–99)
Glucose-Capillary: 204 mg/dL — ABNORMAL HIGH (ref 70–99)
Glucose-Capillary: 206 mg/dL — ABNORMAL HIGH (ref 70–99)
Glucose-Capillary: 216 mg/dL — ABNORMAL HIGH (ref 70–99)
Glucose-Capillary: 226 mg/dL — ABNORMAL HIGH (ref 70–99)
Glucose-Capillary: 226 mg/dL — ABNORMAL HIGH (ref 70–99)
Glucose-Capillary: 243 mg/dL — ABNORMAL HIGH (ref 70–99)
Glucose-Capillary: 248 mg/dL — ABNORMAL HIGH (ref 70–99)
Glucose-Capillary: 248 mg/dL — ABNORMAL HIGH (ref 70–99)
Glucose-Capillary: 300 mg/dL — ABNORMAL HIGH (ref 70–99)
Glucose-Capillary: 362 mg/dL — ABNORMAL HIGH (ref 70–99)
Glucose-Capillary: 378 mg/dL — ABNORMAL HIGH (ref 70–99)

## 2020-11-01 LAB — COMPREHENSIVE METABOLIC PANEL
ALT: 142 U/L — ABNORMAL HIGH (ref 0–44)
AST: 109 U/L — ABNORMAL HIGH (ref 15–41)
Albumin: 2.2 g/dL — ABNORMAL LOW (ref 3.5–5.0)
Alkaline Phosphatase: 79 U/L (ref 38–126)
Anion gap: 9 (ref 5–15)
BUN: 19 mg/dL (ref 8–23)
CO2: 23 mmol/L (ref 22–32)
Calcium: 8.1 mg/dL — ABNORMAL LOW (ref 8.9–10.3)
Chloride: 102 mmol/L (ref 98–111)
Creatinine, Ser: 1.75 mg/dL — ABNORMAL HIGH (ref 0.44–1.00)
GFR, Estimated: 32 mL/min — ABNORMAL LOW (ref >60)
Glucose, Bld: 238 mg/dL — ABNORMAL HIGH (ref 70–99)
Potassium: 4.4 mmol/L (ref 3.5–5.1)
Sodium: 134 mmol/L — ABNORMAL LOW (ref 135–145)
Total Bilirubin: 0.8 mg/dL (ref 0.3–1.2)
Total Protein: 6.5 g/dL (ref 6.5–8.1)

## 2020-11-01 LAB — RENAL FUNCTION PANEL
Albumin: 2.3 g/dL — ABNORMAL LOW (ref 3.5–5.0)
Anion gap: 10 (ref 5–15)
BUN: 15 mg/dL (ref 8–23)
CO2: 24 mmol/L (ref 22–32)
Calcium: 8.7 mg/dL — ABNORMAL LOW (ref 8.9–10.3)
Chloride: 100 mmol/L (ref 98–111)
Creatinine, Ser: 1.49 mg/dL — ABNORMAL HIGH (ref 0.44–1.00)
GFR, Estimated: 39 mL/min — ABNORMAL LOW (ref >60)
Glucose, Bld: 118 mg/dL — ABNORMAL HIGH (ref 70–99)
Phosphorus: 2.3 mg/dL — ABNORMAL LOW (ref 2.5–4.6)
Potassium: 4.4 mmol/L (ref 3.5–5.1)
Sodium: 134 mmol/L — ABNORMAL LOW (ref 135–145)

## 2020-11-01 LAB — CBC WITH DIFFERENTIAL/PLATELET
Abs Immature Granulocytes: 0 10*3/uL (ref 0.00–0.07)
Basophils Absolute: 0.3 10*3/uL — ABNORMAL HIGH (ref 0.0–0.1)
Basophils Relative: 1 %
Eosinophils Absolute: 0.3 10*3/uL (ref 0.0–0.5)
Eosinophils Relative: 1 %
HCT: 27.8 % — ABNORMAL LOW (ref 36.0–46.0)
Hemoglobin: 8.1 g/dL — ABNORMAL LOW (ref 12.0–15.0)
Lymphocytes Relative: 11 %
Lymphs Abs: 3 10*3/uL (ref 0.7–4.0)
MCH: 24 pg — ABNORMAL LOW (ref 26.0–34.0)
MCHC: 29.1 g/dL — ABNORMAL LOW (ref 30.0–36.0)
MCV: 82.5 fL (ref 80.0–100.0)
Monocytes Absolute: 0.8 10*3/uL (ref 0.1–1.0)
Monocytes Relative: 3 %
Neutro Abs: 22.8 10*3/uL — ABNORMAL HIGH (ref 1.7–7.7)
Neutrophils Relative %: 84 %
Platelets: 449 10*3/uL — ABNORMAL HIGH (ref 150–400)
RBC: 3.37 MIL/uL — ABNORMAL LOW (ref 3.87–5.11)
RDW: 19.7 % — ABNORMAL HIGH (ref 11.5–15.5)
WBC: 27.1 10*3/uL — ABNORMAL HIGH (ref 4.0–10.5)
nRBC: 0.9 % — ABNORMAL HIGH (ref 0.0–0.2)
nRBC: 1 /100 WBC — ABNORMAL HIGH

## 2020-11-01 LAB — CULTURE, BLOOD (ROUTINE X 2)
Culture: NO GROWTH
Special Requests: ADEQUATE

## 2020-11-01 LAB — MAGNESIUM: Magnesium: 2.3 mg/dL (ref 1.7–2.4)

## 2020-11-01 LAB — HEPARIN LEVEL (UNFRACTIONATED): Heparin Unfractionated: 0.31 IU/mL (ref 0.30–0.70)

## 2020-11-01 LAB — PHOSPHORUS: Phosphorus: 2.6 mg/dL (ref 2.5–4.6)

## 2020-11-01 LAB — APTT: aPTT: 104 seconds — ABNORMAL HIGH (ref 24–36)

## 2020-11-01 MED ORDER — INSULIN ASPART 100 UNIT/ML IJ SOLN
3.0000 [IU] | INTRAMUSCULAR | Status: DC
Start: 2020-11-01 — End: 2020-11-02
  Administered 2020-11-01 (×3): 6 [IU] via SUBCUTANEOUS
  Administered 2020-11-02: 9 [IU] via SUBCUTANEOUS

## 2020-11-01 MED ORDER — INSULIN GLARGINE 100 UNIT/ML ~~LOC~~ SOLN
40.0000 [IU] | Freq: Two times a day (BID) | SUBCUTANEOUS | Status: DC
  Administered 2020-11-01 (×2): 40 [IU] via SUBCUTANEOUS
  Filled 2020-11-01 (×4): qty 0.4

## 2020-11-01 MED ORDER — DEXTROSE 10 % IV SOLN: INTRAVENOUS | Status: DC | PRN

## 2020-11-01 MED ORDER — INSULIN ASPART 100 UNIT/ML IJ SOLN
5.0000 [IU] | INTRAMUSCULAR | Status: DC
  Administered 2020-11-01 – 2020-11-02 (×4): 5 [IU] via SUBCUTANEOUS

## 2020-11-01 MED FILL — Medication: Qty: 1 | Status: AC

## 2020-11-01 NOTE — Progress Notes (Signed)
Attending:    Subjective: Started on an insulin drip yesterday Remains on levophed and vasopressin Remains on propofol and fentanyl WBC slightly Chest tube placed yesterday  Objective: Vitals:   11/01/20 0915 11/01/20 0930 11/01/20 0945 11/01/20 1000  BP:    (!) 153/44  Pulse: 76 74 74 73  Resp: (!) 24 (!) 24 (!) 24 (!) 24  Temp:      TempSrc:      SpO2: 98% 98% 99% 99%  Weight:      Height:       Vent Mode: PRVC FiO2 (%):  [40 %] 40 % Set Rate:  [24 bmp] 24 bmp Vt Set:  [440 mL] 440 mL PEEP:  [8 cmH20] 8 cmH20 Plateau Pressure:  [24 cmH20-26 cmH20] 24 cmH20  Intake/Output Summary (Last 24 hours) at 11/01/2020 1108 Last data filed at 11/01/2020 1000 Gross per 24 hour  Intake 4243.41 ml  Output 7070 ml  Net -2826.59 ml    General:  In bed on vent HENT: NCAT ETT in place PULM: CTA B, vent supported breathing CV: RRR, no mgr GI: BS+, soft, nontender MSK: s/p R BKA, stump without redness or drainage. Neuro: sedated on vent   CBC    Component Value Date/Time   WBC 27.1 (H) 11/01/2020 0308   RBC 3.37 (L) 11/01/2020 0308   HGB 8.1 (L) 11/01/2020 0308   HGB 11.0 (L) 10/18/2020 1505   HCT 27.8 (L) 11/01/2020 0308   HCT 36.6 10/18/2020 1505   PLT 449 (H) 11/01/2020 0308   PLT 495 (H) 10/18/2020 1505   MCV 82.5 11/01/2020 0308   MCV 82 10/18/2020 1505   MCH 24.0 (L) 11/01/2020 0308   MCHC 29.1 (L) 11/01/2020 0308   RDW 19.7 (H) 11/01/2020 0308   RDW 16.4 (H) 10/18/2020 1505   LYMPHSABS 3.0 11/01/2020 0308   LYMPHSABS 1.9 10/18/2020 1505   MONOABS 0.8 11/01/2020 0308   EOSABS 0.3 11/01/2020 0308   EOSABS 0.2 10/18/2020 1505   BASOSABS 0.3 (H) 11/01/2020 0308   BASOSABS 0.1 10/18/2020 1505    BMET    Component Value Date/Time   NA 134 (L) 11/01/2020 0308   NA 138 10/18/2020 1505   K 4.4 11/01/2020 0308   CL 102 11/01/2020 0308   CO2 23 11/01/2020 0308   GLUCOSE 238 (H) 11/01/2020 0308   BUN 19 11/01/2020 0308   BUN 13 10/18/2020 1505   CREATININE 1.75  (H) 11/01/2020 0308   CALCIUM 8.1 (L) 11/01/2020 0308   GFRNONAA 32 (L) 11/01/2020 0308   GFRAA 8 (L) 04/06/2020 1113    CXR images reviewed, Left pigtail catheter in place, ett in place, no left pleural effusion  Impression/Plan: ESRD > continue CRRT per renal, remove 100cc/hr PAD> statin, plavix DM2 with hyperglycemia > insulin infusion> change to sub cutaneous insulin today Left exudative effusion> parapneumonic, continue chest tube, continue antibiotics Left pneumonia> continue cefepime, monitor cultures Acute respiratory failure with hypoxemia > continue full vent support, vap prevention Septic shock> not clear to me that this is entirely pneumonia; right BKA stump with mild necrosis, I will ask vascular to look at the wound to see if they think there could be an underlying infection. At this time not stable enough to go for CT chest/ab/pelvis but may need to order that tomorrow  My cc time 35 minutes  Roselie Awkward, MD [facility] Pager: [phone] Cell: [phone] After 3pm or if no response, call [phone]

## 2020-11-01 NOTE — Progress Notes (Addendum)
ANTICOAGULATION CONSULT NOTE  Pharmacy Consult for Heparin  Indication: atrial fibrillation  Allergies  Allergen Reactions   Sulfa Antibiotics Swelling    Patient Measurements: Height: '5\' 4"'$  (162.6 cm) Weight: 87.5 kg (192 lb 14.4 oz) IBW/kg (Calculated) : 54.7   Vital Signs: Temp: 97.7 F (36.5 C) (07/06 0600) Temp Source: Esophageal (07/06 0600) BP: 135/55 (07/06 0600) Pulse Rate: 76 (07/06 0615)  Labs: Recent Labs    10/30/20 0422 10/30/20 1600 10/31/20 0323 10/31/20 1600 11/01/20 0308  HGB 8.6*  --  8.4*  --  8.1*  HCT 28.7*  --  28.2*  --  27.8*  PLT 556*  --  497*  --  449*  APTT 104*  --  91*  --  104*  HEPARINUNFRC 0.28*  --  0.34  --  0.31  CREATININE 3.42*   < > 2.13* 2.04* 1.75*   < > = values in this interval not displayed.     Estimated Creatinine Clearance: 35.2 mL/min (A) (by C-G formula based on SCr of 1.75 mg/dL (H)).   Medical History: Past Medical History:  Diagnosis Date   Anemia    Arthritis    Asthma    in the past   CHF (congestive heart failure) ([place])    Diabetes mellitus with complication ([place])    Type 2   End stage renal disease on dialysis ([place])    Gangrene (HCC)    Hyperlipidemia    Hypertension     Assessment: 65 y/o F with respiratory distress/pulmonary edema requiring transfer to the ICU and intubation now on CRRT (ESRD). She is also noted to be in afib/flutter and on IV heparin per pharmacy. -heparin level at goal, hg= 8.1  Goal of Therapy:  Heparin level 0.3-0.7 units/ml Monitor platelets by anticoagulation protocol: Yes   Plan:  Continue heparin at 1250 units/hr Daily CBC/Heparin level  Hildred Laser, PharmD Clinical Pharmacist **Pharmacist phone directory can now be found on [email or web address] ([email or web address]).  Listed under [place].

## 2020-11-01 NOTE — Progress Notes (Signed)
Progress Note  Patient Name: HAPPY BEGEMAN Date of Encounter: 11/01/2020  [facility] Cardiologist: Dr Percival Spanish  Subjective   Remains intubated and sedated  Inpatient Medications    Scheduled Meds:  aspirin  81 mg Per Tube Daily   atorvastatin  40 mg Per Tube Daily   B-complex with vitamin C  1 tablet Per Tube Daily   chlorhexidine gluconate (MEDLINE KIT)  15 mL Mouth Rinse BID   Chlorhexidine Gluconate Cloth  6 each Topical Q0600   clopidogrel  75 mg Per Tube Daily   darbepoetin (ARANESP) injection - DIALYSIS  100 mcg Intravenous Q Tue-HD   docusate  100 mg Per Tube Daily   feeding supplement (PROSource TF)  90 mL Per Tube TID   fentaNYL (SUBLIMAZE) injection  100 mcg Intravenous Once   fentaNYL (SUBLIMAZE) injection  50 mcg Intravenous Once   guaiFENesin  15 mL Per Tube Q6H   ipratropium-albuterol  3 mL Nebulization TID   mouth rinse  15 mL Mouth Rinse 10 times per day   pantoprazole (PROTONIX) IV  40 mg Intravenous Q24H   polyethylene glycol  17 g Per Tube Daily   sodium chloride flush  10 mL Intracatheter Q8H   sodium chloride flush  10-40 mL Intracatheter Q12H   Continuous Infusions:   prismasol BGK 4/2.5 400 mL/hr at 10/31/20 0916    prismasol BGK 4/2.5 200 mL/hr at 10/31/20 2126   amiodarone 30 mg/hr (11/01/20 0731)   ceFEPime (MAXIPIME) IV Stopped (10/31/20 2247)   feeding supplement (VITAL 1.5 CAL) 45 mL/hr at 10/31/20 2000   fentaNYL infusion INTRAVENOUS 200 mcg/hr (11/01/20 0700)   heparin 1,250 Units/hr (11/01/20 0700)   insulin 7 Units/hr (11/01/20 0700)   norepinephrine (LEVOPHED) Adult infusion 14 mcg/min (11/01/20 0700)   prismasol BGK 4/2.5 1,800 mL/hr at 11/01/20 0510   propofol (DIPRIVAN) infusion 30 mcg/kg/min (11/01/20 0700)   vasopressin 0.03 Units/min (11/01/20 0700)   PRN Meds: acetaminophen (TYLENOL) oral liquid 160 mg/5 mL, albuterol, alteplase, dextrose, heparin, hydrALAZINE, metoprolol tartrate, midazolam, sodium chloride, sodium  chloride flush   Vital Signs    Vitals:   11/01/20 0545 11/01/20 0600 11/01/20 0615 11/01/20 0736  BP:  (!) 135/55    Pulse: 75 77 76   Resp: (!) 24 (!) 24 (!) 24   Temp:  97.7 F (36.5 C)    TempSrc:  Esophageal    SpO2: 98% 98% 98% 99%  Weight:      Height:        Intake/Output Summary (Last 24 hours) at 11/01/2020 0759 Last data filed at 11/01/2020 0700 Gross per 24 hour  Intake 4199.46 ml  Output 7560 ml  Net -3360.54 ml    Last 3 Weights 11/01/2020 10/31/2020 10/30/2020  Weight (lbs) 192 lb 14.4 oz 202 lb 13.2 oz 209 lb 7 oz  Weight (kg) 87.5 kg 92 kg 95 kg      Telemetry    Sinus with PVCs and NSVT- Personally Reviewed   Physical Exam   GEN: Intubated and sedated WD Neck: No JVD, no adenopathy Cardiac: irregular, no murmur Respiratory: CTA anteriorly; no wheeze GI: Soft, ND, no masses MS: s/p bilateral amputation Neuro:  Intubated and sedated Psych: Not assessed  Labs    High Sensitivity Troponin:   Recent Labs  Lab 10/27/20 2223 10/28/20 0130 10/28/20 0358 10/28/20 0552  TROPONINIHS 79* 82* 109* 107*       Chemistry Recent Labs  Lab 10/27/20 1851 10/28/20 0358 10/29/20 0443 10/31/20 0323 10/31/20  1600 10/31/20 2220 11/01/20 0308  NA 130* 131*   < > 134* 135  --  134*  K 4.0 3.9   < > 4.6 4.3  --  4.4  CL 90* 95*   < > 101 102  --  102  CO2 25 25   < > 22 24  --  23  GLUCOSE 230* 207*   < > 262* 291*  --  238*  BUN 25* 25*   < > 16 18  --  19  CREATININE 6.44* 6.31*   < > 2.13* 2.04*  --  1.75*  CALCIUM 8.3* 7.6*   < > 8.2* 7.8*  --  8.1*  PROT 7.6 6.7  --   --   --  6.5 6.5  ALBUMIN 2.8* 2.5*   < > 2.1* 2.0*  --  2.2*  AST 30 37  --   --   --   --  109*  ALT 18 23  --   --   --   --  142*  ALKPHOS 86 63  --   --   --   --  79  BILITOT 0.5 0.5  --   --   --   --  0.8  GFRNONAA 7* 7*   < > 26* 27*  --  32*  ANIONGAP 15 11   < > 11 9  --  9   < > = values in this interval not displayed.      Hematology Recent Labs  Lab  10/30/20 0422 10/31/20 0323 11/01/20 0308  WBC 27.4* 26.4* 27.1*  RBC 3.51* 3.42* 3.37*  HGB 8.6* 8.4* 8.1*  HCT 28.7* 28.2* 27.8*  MCV 81.8 82.5 82.5  MCH 24.5* 24.6* 24.0*  MCHC 30.0 29.8* 29.1*  RDW 19.6* 19.7* 19.7*  PLT 556* 497* 449*      Radiology    DG Chest Port 1 View  Result Date: 10/31/2020 CLINICAL DATA:  Chest tube placement EXAM: PORTABLE CHEST 1 VIEW COMPARISON:  10/31/2020 FINDINGS: Endotracheal tube overlies the distal trachea, 8 mm above the carina. Left neck catheter tip overlies the there is a new left basilar chest tube with significantly decreased size of the left pleural effusion. Unchanged right pleural effusion. Unchanged cardiomediastinal silhouette. Unchanged right axillary stent. Esophageal temperature probe overlies the thoracic inlet. Bones are unchanged. IMPRESSION: Significantly decreased size of the left pleural effusion after chest tube placement. Unchanged cardiomegaly and right pleural effusion. Endotracheal tube tip is again seen in the distal trachea, 8 mm above the carina, consider retraction by 2.0 cm. Electronically Signed   By: Maurine Simmering   On: 10/31/2020 18:43   DG CHEST PORT 1 VIEW  Result Date: 10/31/2020 CLINICAL DATA:  Pleural effusion history of CHF. History of asthma. EXAM: PORTABLE CHEST 1 VIEW COMPARISON:  10/29/2020. FINDINGS: Endotracheal tube noted with tip 1 cm above the carina. Proximal repositioning of approximately 2 cm should be considered. NG tube and left IJ line in stable position. Cardiomegaly with bilateral pulmonary infiltrates/edema and bilateral pleural effusions. Findings consistent with CHF. No pneumothorax. Right subclavian vascular stent noted. IMPRESSION: 1. Endotracheal tube noted with its tip 1 cm above the carina. Proximal repositioning of approximately 2 cm should be considered. NG tube left IJ line in stable position. 2. Cardiomegaly with bilateral pulmonary infiltrates/edema bilateral pleural effusions. Findings  consistent CHF. Electronically Signed   By: Marcello Moores  Register   On: 10/31/2020 12:45     Patient Profile  65 y.o. female admitted with pneumonia and respiratory failure being evaluated for atrial fibrillation.  Echocardiogram showed normal LV function, moderate left ventricular hypertrophy, moderate pulmonary hypertension, small pericardial effusion.  Assessment & Plan    1 paroxysmal atrial fibrillation-patient back in sinus rhythm today.  We will continue IV amiodarone and IV heparin.  Patient will require long-term anticoagulation following discharge.    2 respiratory failure-felt secondary to pneumonia and acute on chronic diastolic congestive heart failure.  Vent management per critical care medicine.  Continue volume removal as tolerated with CRRT.  3 shock-wean pressors as tolerated.  We will continue to try and maintain sinus rhythm with amiodarone as hemodynamics or worse when she is in atrial fibrillation.  4 end-stage renal disease-nephrology managing.    For questions or updates, please contact [place] Please consult [email or web address] for contact info under        Signed, Kirk Ruths, MD  11/01/2020, 7:59 AM

## 2020-11-01 NOTE — Progress Notes (Addendum)
NAME:  Christy Zhang, MRN:  [id number], DOB:  1955/05/17, LOS: 5 ADMISSION DATE:  10/27/2020, CONSULTATION DATE:  7/3 REFERRING MD:  Tyrell Antonio, CHIEF COMPLAINT:  dyspnea   Brief History of Present Illness:  65 y/o female admitted for presumed pneumonia and hypotension, required intubation, mechanical ventilation and CRRT in the setting of septic shock from pneumonia cauting acute respiratory failure with hypoxemia and AKI.  Pertinent  Medical History  CHF CKD T2DM ESRD HLD HTN  Significant Hospital Events: Including procedures, antibiotic start and stop dates in addition to other pertinent events   Admitted to hospital on 7/1 7/1 cefepime >  7/1 flagyl x1 7/1 vancomycin>  Admitted to ICU on 10/29/20 for CRRT 7/3 - brief PEA with bearing down while agitated. 7/3 ETT 7/3 left radial arterial line 7/3 L IJ HD cath 7/5 L pigtail CT placed for L pleural effusion 7/6 CT output -550 cc   Interim History / Subjective:   Afebrile; WBC 27.1 from 26.4 Sinus rhythm today On vaso 0.03 and levo 15 Sedated on prop ad fent On Heparin and amio  Remains intubated on mech vent   Objective   Blood pressure (!) 156/45, pulse 76, temperature 98.2 F (36.8 C), temperature source Esophageal, resp. rate (!) 24, height '5\' 4"'$  (1.626 m), weight 87.5 kg, SpO2 99 %.    Vent Mode: PRVC FiO2 (%):  [40 %] 40 % Set Rate:  [24 bmp] 24 bmp Vt Set:  [440 mL] 440 mL PEEP:  [8 cmH20] 8 cmH20 Plateau Pressure:  [21 cmH20-26 cmH20] 26 cmH20   Intake/Output Summary (Last 24 hours) at 11/01/2020 0942 Last data filed at 11/01/2020 [id number] Gross per 24 hour  Intake 4369.76 ml  Output 7358 ml  Net -2988.24 ml    Filed Weights   10/30/20 0157 10/31/20 0500 11/01/20 0500  Weight: 95 kg 92 kg 87.5 kg    Examination: General:  critically ill appearing on mech vent HEENT: MM pink/moist; ETT in place Neuro: sedated on vent CV: s1s2, RRR, no m/r/g PULM:  dim bs bilaterally; L pigtail CT in place w/ straw  colored fluid; on mech vent PRVC GI: soft, bsx4 active  Extremities: warm/dry, bilateral BKA Skin: no rashes or lesions  7/1 blood culture > negative 7/1 urine culture > multiple species 7/2 SARS cov 2/flu > neg  CXR:  L pigtail CT in place with improving left pleural effusion ett in place personally reviewed  Resolved Hospital Problem list     Assessment & Plan:  Acute respiratory failure with hypercarbia and hypoxemia due to severe community acquired pneumonia Full mechanical vent support L pleural effusion: Lights criteria 98% positive; pleural cultures sent 7/6 P: -continue mech vent support: PRVC -daily SBTs/SAT -VAP prevention -monitor chest tube output; follow trach and pleural cultures -continue cefepime -CXR am  Need for sedation for mechanical ventilation P: -continue sedation for RASS goal -2 -PAD protocol  Constipation P: -continue bowel regimen today  Septic shock due to CAP P: -wean pressors for MAP >65  ESRD P: -Nephro following -continue CRRT w/ volume removal -trend bmp and uop  PAD P: -continue statin and plavix  Atrial filbrillation P: -continuous telemetry -continue IV heparin and IV amio   DM2 with hyperglycemia P: -will stop insulin drip and transition to resistant SSI and basal insulin at 40 -CBG monitoring -continue tube feeds   Best Practice (right click and "Reselect all SmartList Selections" daily)   Diet/type: tubefeeds DVT prophylaxis: systemic heparin GI prophylaxis: PPI Lines: yes  and it is still needed Foley:  N/A Code Status:  full code Last date of multidisciplinary goals of care discussion [7/6 updated daughter by phone]  Labs   CBC: Recent Labs  Lab 10/27/20 1851 10/28/20 0358 10/29/20 0443 10/29/20 1629 10/29/20 1630 10/30/20 0422 10/31/20 0323 11/01/20 0308  WBC 18.7* 17.5*  --  25.5*  --  27.4* 26.4* 27.1*  NEUTROABS 15.7*  --   --   --   --   --   --  22.8*  HGB 9.5* 8.8*   < > 8.0* 10.2* 8.6*  8.4* 8.1*  HCT 32.0* 29.4*   < > 27.1* 30.0* 28.7* 28.2* 27.8*  MCV 81.8 82.4  --  83.4  --  81.8 82.5 82.5  PLT 736* 649*  --  543*  --  556* 497* 449*   < > = values in this interval not displayed.     Basic Metabolic Panel: Recent Labs  Lab 10/29/20 1629 10/29/20 1630 10/30/20 0422 10/30/20 1600 10/30/20 1610 10/31/20 0323 10/31/20 1600 11/01/20 0308  NA  --    < > 131* 133*  --  134* 135 134*  K  --    < > 4.7 4.3  --  4.6 4.3 4.4  CL  --   --  100 100  --  101 102 102  CO2  --   --  22 22  --  '22 24 23  '$ GLUCOSE  --   --  199* 198*  --  262* 291* 238*  BUN  --   --  17 16  --  '16 18 19  '$ CREATININE  --   --  3.42* 2.64*  --  2.13* 2.04* 1.75*  CALCIUM  --   --  7.9* 8.0*  --  8.2* 7.8* 8.1*  MG 2.0  --  2.2  --  2.3 2.4  --  2.3  PHOS  --   --  2.9 2.1*  --  2.7 1.9* 2.6   < > = values in this interval not displayed.    GFR: Estimated Creatinine Clearance: 35.2 mL/min (A) (by C-G formula based on SCr of 1.75 mg/dL (H)). Recent Labs  Lab 10/27/20 1927 10/27/20 2150 10/28/20 0133 10/28/20 0358 10/29/20 1629 10/30/20 0422 10/31/20 0323 11/01/20 0308  PROCALCITON  --   --   --  4.20  --   --   --   --   WBC  --   --   --  17.5* 25.5* 27.4* 26.4* 27.1*  LATICACIDVEN 2.9* 2.0* 2.0* 1.3  --   --   --   --      Liver Function Tests: Recent Labs  Lab 10/27/20 1851 10/28/20 0358 10/30/20 0422 10/30/20 1600 10/31/20 0323 10/31/20 1600 10/31/20 2220 11/01/20 0308  AST 30 37  --   --   --   --   --  109*  ALT 18 23  --   --   --   --   --  142*  ALKPHOS 86 63  --   --   --   --   --  79  BILITOT 0.5 0.5  --   --   --   --   --  0.8  PROT 7.6 6.7  --   --   --   --  6.5 6.5  ALBUMIN 2.8* 2.5* 2.2* 2.1* 2.1* 2.0*  --  2.2*    No results for input(s): LIPASE, AMYLASE in the last  168 hours. No results for input(s): AMMONIA in the last 168 hours.  ABG    Component Value Date/Time   PHART 7.302 (L) 10/29/2020 1630   PCO2ART 45.6 10/29/2020 1630   PO2ART  249 (H) 10/29/2020 1630   HCO3 23.0 10/29/2020 1630   TCO2 24 10/29/2020 1630   ACIDBASEDEF 4.0 (H) 10/29/2020 1630   O2SAT 100.0 10/29/2020 1630      Coagulation Profile: Recent Labs  Lab 10/27/20 1851 10/28/20 0358  INR 1.5* 1.5*     Cardiac Enzymes: No results for input(s): CKTOTAL, CKMB, CKMBINDEX, TROPONINI in the last 168 hours.  HbA1C: HB A1C (BAYER DCA - WAIVED)  Date/Time Value Ref Range Status  04/06/2020 11:05 AM 5.4 <7.0 % Final    Comment:                                          Diabetic Adult            <7.0                                       Healthy Adult        4.3 - 5.7                                                           (DCCT/NGSP) American Diabetes Association's Summary of Glycemic Recommendations for Adults with Diabetes: Hemoglobin A1c <7.0%. More stringent glycemic goals (A1c <6.0%) may further reduce complications at the cost of increased risk of hypoglycemia.   01/06/2020 10:13 AM 6.2 <7.0 % Final    Comment:                                          Diabetic Adult            <7.0                                       Healthy Adult        4.3 - 5.7                                                           (DCCT/NGSP) American Diabetes Association's Summary of Glycemic Recommendations for Adults with Diabetes: Hemoglobin A1c <7.0%. More stringent glycemic goals (A1c <6.0%) may further reduce complications at the cost of increased risk of hypoglycemia.    Hgb A1c MFr Bld  Date/Time Value Ref Range Status  10/27/2020 06:48 PM 6.4 (H) 4.8 - 5.6 % Final    Comment:    (NOTE)         Prediabetes: 5.7 - 6.4         Diabetes: >6.4         Glycemic control for  adults with diabetes: <7.0   08/09/2020 05:23 AM 6.5 (H) 4.8 - 5.6 % Final    Comment:    (NOTE) Pre diabetes:          5.7%-6.4%  Diabetes:              >6.4%  Glycemic control for   <7.0% adults with diabetes     CBG: Recent Labs  Lab 11/01/20 0408 11/01/20 0506  11/01/20 0604 11/01/20 0707 11/01/20 0806  GLUCAP 226* 191* 201* 187* 206*     Critical care time: 35 minutes    JD Rexene Agent [facility] 11/01/2020, 11:29 AM  Please see [email or web address] for pager details.  From 7A-7P if no response, please call [phone]. After hours, please call ELink [phone].

## 2020-11-01 NOTE — Progress Notes (Addendum)
  Progress Note    11/01/2020 2:12 PM   Subjective:  intubated and sedated   Vitals:   11/01/20 1345 11/01/20 1400  BP:  (!) 161/47  Pulse: 75 75  Resp: (!) 24 (!) 24  Temp:    SpO2: 99% 100%    Physical Exam: Incisions:  looks similar to picture from 7/1.  There are two areas of eschar and the skin in between these areas actually look a bit better.  Dr. Trula Slade probed the wound with cotton tip applicator and there is no evidence of any purulence or fluid in the stump.       CBC    Component Value Date/Time   WBC 27.1 (H) 11/01/2020 0308   RBC 3.37 (L) 11/01/2020 0308   HGB 8.1 (L) 11/01/2020 0308   HGB 11.0 (L) 10/18/2020 1505   HCT 27.8 (L) 11/01/2020 0308   HCT 36.6 10/18/2020 1505   PLT 449 (H) 11/01/2020 0308   PLT 495 (H) 10/18/2020 1505   MCV 82.5 11/01/2020 0308   MCV 82 10/18/2020 1505   MCH 24.0 (L) 11/01/2020 0308   MCHC 29.1 (L) 11/01/2020 0308   RDW 19.7 (H) 11/01/2020 0308   RDW 16.4 (H) 10/18/2020 1505   LYMPHSABS 3.0 11/01/2020 0308   LYMPHSABS 1.9 10/18/2020 1505   MONOABS 0.8 11/01/2020 0308   EOSABS 0.3 11/01/2020 0308   EOSABS 0.2 10/18/2020 1505   BASOSABS 0.3 (H) 11/01/2020 0308   BASOSABS 0.1 10/18/2020 1505    BMET    Component Value Date/Time   NA 134 (L) 11/01/2020 0308   NA 138 10/18/2020 1505   K 4.4 11/01/2020 0308   CL 102 11/01/2020 0308   CO2 23 11/01/2020 0308   GLUCOSE 238 (H) 11/01/2020 0308   BUN 19 11/01/2020 0308   BUN 13 10/18/2020 1505   CREATININE 1.75 (H) 11/01/2020 0308   CALCIUM 8.1 (L) 11/01/2020 0308   GFRNONAA 32 (L) 11/01/2020 0308   GFRAA 8 (L) 04/06/2020 1113    INR    Component Value Date/Time   INR 1.5 (H) 10/28/2020 0358     Intake/Output Summary (Last 24 hours) at 11/01/2020 1412 Last data filed at 11/01/2020 1400 Gross per 24 hour  Intake 4481.42 ml  Output 7557 ml  Net -3075.58 ml     Assessment/Plan:  65 y.o. female is s/p right below knee amputation 09/14/2020 by Dr. Donzetta Matters admitted 4  days ago with AMS now intubated and leukocytosis.   -wound inspected by Dr. Trula Slade and do not feel this is the source of her infection.    -Pt urine with yeast on 10/28/2020.  Blood cx negative.  Gram stain of pleural fluid no organisms seen and no growth on cx thus far.  -Will remove staples today as they really aren't helping hold the wound together at this point.     Leontine Locket, PA-C [facility] [phone] 11/01/2020 2:12 PM

## 2020-11-01 NOTE — Progress Notes (Signed)
Pharmacy Antibiotic Note  Christy Zhang is a 64 y.o. female with septic shock due to PNA and with ESRD on CRRT. Pharmacy has been consulted for cefepime dosing (day 6 antibiotics).Chest tube placed 7/6 (24 hr outpt ~ 550) -WBC= 27, afebrile -cultures- ngtd   Plan: Continue cefepime 2g IV q12h Change back to iHD dosing once CRRT stops  Height: '5\' 4"'$  (162.6 cm) Weight: 87.5 kg (192 lb 14.4 oz) IBW/kg (Calculated) : 54.7  Temp (24hrs), Avg:98.1 F (36.7 C), Min:97.2 F (36.2 C), Max:99.1 F (37.3 C)  Recent Labs  Lab 10/27/20 1927 10/27/20 2150 10/28/20 0133 10/28/20 0358 10/29/20 1528 10/29/20 1629 10/30/20 0422 10/30/20 1600 10/31/20 0323 10/31/20 1600 11/01/20 0308  WBC  --   --   --  17.5*  --  25.5* 27.4*  --  26.4*  --  27.1*  CREATININE  --   --   --  6.31*   < >  --  3.42* 2.64* 2.13* 2.04* 1.75*  LATICACIDVEN 2.9* 2.0* 2.0* 1.3  --   --   --   --   --   --   --    < > = values in this interval not displayed.     Estimated Creatinine Clearance: 35.2 mL/min (A) (by C-G formula based on SCr of 1.75 mg/dL (H)).    Allergies  Allergen Reactions   Sulfa Antibiotics Swelling   Hildred Laser, PharmD Clinical Pharmacist **Pharmacist phone directory can now be found on [email or web address] ([email or web address]).  Listed under [place].

## 2020-11-01 NOTE — Progress Notes (Signed)
[facility] NEPHROLOGY PROGRESS NOTE  Assessment/ Plan: Pt is a 65 y.o. yo female  with history of HTN, HLD, peripheral vascular disease status post bilateral BKA, DM, anemia, ESRD on HD MWF at [facility] presented with cough, shortness of breath and generalized weakness, seen as a consultation for the management of dialysis.  Dialysis Orders from previous admission:  [facility] - MWF 4 hrs 400/500 97 kg 2.0K/2.5 Ca 16g needles -Heparin 1000 units initial bolus-heparin pump 1000 units/hr stop 1 hour prior to end of tx  -Epogen 14000 units IV TIW -Hectorol 1.5 mcg IV TIW  #Septic shock due to left lung pneumonia: Patient was transferred to [facility].  Vent and pressor support per CCM.  Abx per primary service  #Vent dependent respiratory failure due to both pneumonia and pulm edema.  Antibiotics as above and volume management with CRRT.  # ESRD MWF schedule.  Now on CRRT because of hemodynamic instability.  Tolerating CRRT well.  On IV heparin for anticoagulation.  UF as tolerated.   #A. fib with RVR: Currently on amiodarone and heparin.  Cardiology is following.   # Anemia: Hemoglobin 8.1 today.  Continue monitor.  Resume ESA.  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  Resume sevelamer when able to take orally. Phos ok  #Hyponatremia, hypervolemic: improved. Managed with CRRT.  Gean Quint, MD [facility]  Subjective: Seen and examined on CRRT in ICU.  Remains intubated/sedated and on levo/vaso. Fio2 40%, net neg around 3.3L no acute events Objective Vital signs in last 24 hours: Vitals:   11/01/20 0600 11/01/20 0615 11/01/20 0736 11/01/20 0800  BP: (!) 135/55   (!) 148/43  Pulse: 77 76  79  Resp: (!) 24 (!) 24  (!) 24  Temp: 97.7 F (36.5 C)   98.2 F (36.8 C)  TempSrc: Esophageal   Esophageal  SpO2: 98% 98% 99% 99%  Weight:      Height:       Weight change: -4.5 kg  Intake/Output Summary (Last 24 hours) at 11/01/2020 0814 Last data filed  at 11/01/2020 0800 Gross per 24 hour  Intake 4211.02 ml  Output 7466 ml  Net -3254.98 ml       Labs: Basic Metabolic Panel: Recent Labs  Lab 10/31/20 0323 10/31/20 1600 11/01/20 0308  NA 134* 135 134*  K 4.6 4.3 4.4  CL 101 102 102  CO2 $Re'22 24 23  'GxH$ GLUCOSE 262* 291* 238*  BUN $Re'16 18 19  'sge$ CREATININE 2.13* 2.04* 1.75*  CALCIUM 8.2* 7.8* 8.1*  PHOS 2.7 1.9* 2.6   Liver Function Tests: Recent Labs  Lab 10/27/20 1851 10/28/20 0358 10/30/20 0422 10/31/20 0323 10/31/20 1600 10/31/20 2220 11/01/20 0308  AST 30 37  --   --   --   --  109*  ALT 18 23  --   --   --   --  142*  ALKPHOS 86 63  --   --   --   --  79  BILITOT 0.5 0.5  --   --   --   --  0.8  PROT 7.6 6.7  --   --   --  6.5 6.5  ALBUMIN 2.8* 2.5*   < > 2.1* 2.0*  --  2.2*   < > = values in this interval not displayed.   No results for input(s): LIPASE, AMYLASE in the last 168 hours. No results for input(s): AMMONIA in the last 168 hours. CBC: Recent Labs  Lab 10/27/20 1851  10/28/20 0358 10/29/20 0443 10/29/20 1629 10/29/20 1630 10/30/20 0422 10/31/20 0323 11/01/20 0308  WBC 18.7* 17.5*  --  25.5*  --  27.4* 26.4* 27.1*  NEUTROABS 15.7*  --   --   --   --   --   --  22.8*  HGB 9.5* 8.8*   < > 8.0*   < > 8.6* 8.4* 8.1*  HCT 32.0* 29.4*   < > 27.1*   < > 28.7* 28.2* 27.8*  MCV 81.8 82.4  --  83.4  --  81.8 82.5 82.5  PLT 736* 649*  --  543*  --  556* 497* 449*   < > = values in this interval not displayed.   Cardiac Enzymes: No results for input(s): CKTOTAL, CKMB, CKMBINDEX, TROPONINI in the last 168 hours. CBG: Recent Labs  Lab 11/01/20 0310 11/01/20 0408 11/01/20 0506 11/01/20 0604 11/01/20 0707  GLUCAP 248* 226* 191* 201* 187*    Iron Studies: No results for input(s): IRON, TIBC, TRANSFERRIN, FERRITIN in the last 72 hours. Studies/Results: DG Chest Port 1 View  Result Date: 10/31/2020 CLINICAL DATA:  Chest tube placement EXAM: PORTABLE CHEST 1 VIEW COMPARISON:  10/31/2020 FINDINGS:  Endotracheal tube overlies the distal trachea, 8 mm above the carina. Left neck catheter tip overlies the there is a new left basilar chest tube with significantly decreased size of the left pleural effusion. Unchanged right pleural effusion. Unchanged cardiomediastinal silhouette. Unchanged right axillary stent. Esophageal temperature probe overlies the thoracic inlet. Bones are unchanged. IMPRESSION: Significantly decreased size of the left pleural effusion after chest tube placement. Unchanged cardiomegaly and right pleural effusion. Endotracheal tube tip is again seen in the distal trachea, 8 mm above the carina, consider retraction by 2.0 cm. Electronically Signed   By: Maurine Simmering   On: 10/31/2020 18:43   DG CHEST PORT 1 VIEW  Result Date: 10/31/2020 CLINICAL DATA:  Pleural effusion history of CHF. History of asthma. EXAM: PORTABLE CHEST 1 VIEW COMPARISON:  10/29/2020. FINDINGS: Endotracheal tube noted with tip 1 cm above the carina. Proximal repositioning of approximately 2 cm should be considered. NG tube and left IJ line in stable position. Cardiomegaly with bilateral pulmonary infiltrates/edema and bilateral pleural effusions. Findings consistent with CHF. No pneumothorax. Right subclavian vascular stent noted. IMPRESSION: 1. Endotracheal tube noted with its tip 1 cm above the carina. Proximal repositioning of approximately 2 cm should be considered. NG tube left IJ line in stable position. 2. Cardiomegaly with bilateral pulmonary infiltrates/edema bilateral pleural effusions. Findings consistent CHF. Electronically Signed   By: Marcello Moores  Register   On: 10/31/2020 12:45    Medications: Infusions:   prismasol BGK 4/2.5 400 mL/hr at 10/31/20 0916    prismasol BGK 4/2.5 200 mL/hr at 10/31/20 2126   amiodarone 30 mg/hr (11/01/20 0800)   ceFEPime (MAXIPIME) IV Stopped (10/31/20 2247)   feeding supplement (VITAL 1.5 CAL) 45 mL/hr at 10/31/20 2000   fentaNYL infusion INTRAVENOUS 200 mcg/hr (11/01/20  0800)   heparin 1,250 Units/hr (11/01/20 0800)   insulin 5.5 Units/hr (11/01/20 0800)   norepinephrine (LEVOPHED) Adult infusion 15 mcg/min (11/01/20 0800)   prismasol BGK 4/2.5 1,800 mL/hr at 11/01/20 0808   propofol (DIPRIVAN) infusion 30 mcg/kg/min (11/01/20 0800)   vasopressin 0.03 Units/min (11/01/20 0800)    Scheduled Medications:  aspirin  81 mg Per Tube Daily   atorvastatin  40 mg Per Tube Daily   B-complex with vitamin C  1 tablet Per Tube Daily   chlorhexidine gluconate (MEDLINE KIT)  15  mL Mouth Rinse BID   Chlorhexidine Gluconate Cloth  6 each Topical Q0600   clopidogrel  75 mg Per Tube Daily   darbepoetin (ARANESP) injection - DIALYSIS  100 mcg Intravenous Q Tue-HD   docusate  100 mg Per Tube Daily   feeding supplement (PROSource TF)  90 mL Per Tube TID   fentaNYL (SUBLIMAZE) injection  100 mcg Intravenous Once   fentaNYL (SUBLIMAZE) injection  50 mcg Intravenous Once   guaiFENesin  15 mL Per Tube Q6H   ipratropium-albuterol  3 mL Nebulization TID   mouth rinse  15 mL Mouth Rinse 10 times per day   pantoprazole (PROTONIX) IV  40 mg Intravenous Q24H   polyethylene glycol  17 g Per Tube Daily   sodium chloride flush  10 mL Intracatheter Q8H   sodium chloride flush  10-40 mL Intracatheter Q12H    have reviewed scheduled and prn medications.  Physical Exam: General: Critically ill looking female intubated, sedated Heart:RRR, s1s2 nl Lungs: vent, b/l chest expansion Abdomen:soft,  non-distended Extremities: Bilateral BKA, no stump/thigh edema Dialysis Access: Left IJ temporary HD catheter for CRRT.  AV fistula has good thrill and bruit.  Raciel Caffrey 11/01/2020,8:14 AM  LOS: 5 days

## 2020-11-02 ENCOUNTER — Inpatient Hospital Stay (HOSPITAL_COMMUNITY): Payer: Medicare Other

## 2020-11-02 DIAGNOSIS — J189 Pneumonia, unspecified organism: Secondary | ICD-10-CM | POA: Diagnosis not present

## 2020-11-02 DIAGNOSIS — J9601 Acute respiratory failure with hypoxia: Secondary | ICD-10-CM | POA: Diagnosis not present

## 2020-11-02 LAB — POCT I-STAT 7, (LYTES, BLD GAS, ICA,H+H)
Acid-Base Excess: 1 mmol/L (ref 0.0–2.0)
Bicarbonate: 25.8 mmol/L (ref 20.0–28.0)
Calcium, Ion: 1.19 mmol/L (ref 1.15–1.40)
HCT: 32 % — ABNORMAL LOW (ref 36.0–46.0)
Hemoglobin: 10.9 g/dL — ABNORMAL LOW (ref 12.0–15.0)
O2 Saturation: 99 %
Patient temperature: 36.3
Potassium: 4.8 mmol/L (ref 3.5–5.1)
Sodium: 136 mmol/L (ref 135–145)
TCO2: 27 mmol/L (ref 22–32)
pCO2 arterial: 41.2 mmHg (ref 32.0–48.0)
pH, Arterial: 7.401 (ref 7.350–7.450)
pO2, Arterial: 135 mmHg — ABNORMAL HIGH (ref 83.0–108.0)

## 2020-11-02 LAB — RENAL FUNCTION PANEL
Albumin: 2.1 g/dL — ABNORMAL LOW (ref 3.5–5.0)
Anion gap: 11 (ref 5–15)
BUN: 30 mg/dL — ABNORMAL HIGH (ref 8–23)
CO2: 22 mmol/L (ref 22–32)
Calcium: 8.3 mg/dL — ABNORMAL LOW (ref 8.9–10.3)
Chloride: 102 mmol/L (ref 98–111)
Creatinine, Ser: 2.3 mg/dL — ABNORMAL HIGH (ref 0.44–1.00)
GFR, Estimated: 23 mL/min — ABNORMAL LOW (ref >60)
Glucose, Bld: 154 mg/dL — ABNORMAL HIGH (ref 70–99)
Phosphorus: 5.6 mg/dL — ABNORMAL HIGH (ref 2.5–4.6)
Potassium: 4.4 mmol/L (ref 3.5–5.1)
Sodium: 135 mmol/L (ref 135–145)

## 2020-11-02 LAB — COMPREHENSIVE METABOLIC PANEL
ALT: 117 U/L — ABNORMAL HIGH (ref 0–44)
AST: 74 U/L — ABNORMAL HIGH (ref 15–41)
Albumin: 2.4 g/dL — ABNORMAL LOW (ref 3.5–5.0)
Alkaline Phosphatase: 93 U/L (ref 38–126)
Anion gap: 11 (ref 5–15)
BUN: 21 mg/dL (ref 8–23)
CO2: 22 mmol/L (ref 22–32)
Calcium: 8.8 mg/dL — ABNORMAL LOW (ref 8.9–10.3)
Chloride: 98 mmol/L (ref 98–111)
Creatinine, Ser: 1.65 mg/dL — ABNORMAL HIGH (ref 0.44–1.00)
GFR, Estimated: 35 mL/min — ABNORMAL LOW (ref >60)
Glucose, Bld: 306 mg/dL — ABNORMAL HIGH (ref 70–99)
Potassium: 5.2 mmol/L — ABNORMAL HIGH (ref 3.5–5.1)
Sodium: 131 mmol/L — ABNORMAL LOW (ref 135–145)
Total Bilirubin: 0.8 mg/dL (ref 0.3–1.2)
Total Protein: 7.7 g/dL (ref 6.5–8.1)

## 2020-11-02 LAB — CBC
HCT: 29.9 % — ABNORMAL LOW (ref 36.0–46.0)
Hemoglobin: 8.9 g/dL — ABNORMAL LOW (ref 12.0–15.0)
MCH: 24.5 pg — ABNORMAL LOW (ref 26.0–34.0)
MCHC: 29.8 g/dL — ABNORMAL LOW (ref 30.0–36.0)
MCV: 82.1 fL (ref 80.0–100.0)
Platelets: 423 10*3/uL — ABNORMAL HIGH (ref 150–400)
RBC: 3.64 MIL/uL — ABNORMAL LOW (ref 3.87–5.11)
RDW: 20.4 % — ABNORMAL HIGH (ref 11.5–15.5)
WBC: 29.4 10*3/uL — ABNORMAL HIGH (ref 4.0–10.5)
nRBC: 1 % — ABNORMAL HIGH (ref 0.0–0.2)

## 2020-11-02 LAB — GLUCOSE, CAPILLARY
Glucose-Capillary: 267 mg/dL — ABNORMAL HIGH (ref 70–99)
Glucose-Capillary: 274 mg/dL — ABNORMAL HIGH (ref 70–99)
Glucose-Capillary: 227 mg/dL — ABNORMAL HIGH (ref 70–99)
Glucose-Capillary: 148 mg/dL — ABNORMAL HIGH (ref 70–99)
Glucose-Capillary: 145 mg/dL — ABNORMAL HIGH (ref 70–99)
Glucose-Capillary: 186 mg/dL — ABNORMAL HIGH (ref 70–99)
Glucose-Capillary: 229 mg/dL — ABNORMAL HIGH (ref 70–99)
Glucose-Capillary: 236 mg/dL — ABNORMAL HIGH (ref 70–99)
Glucose-Capillary: 231 mg/dL — ABNORMAL HIGH (ref 70–99)
Glucose-Capillary: 207 mg/dL — ABNORMAL HIGH (ref 70–99)
Glucose-Capillary: 231 mg/dL — ABNORMAL HIGH (ref 70–99)
Glucose-Capillary: 189 mg/dL — ABNORMAL HIGH (ref 70–99)
Glucose-Capillary: 160 mg/dL — ABNORMAL HIGH (ref 70–99)
Glucose-Capillary: 234 mg/dL — ABNORMAL HIGH (ref 70–99)

## 2020-11-02 LAB — VANCOMYCIN, RANDOM: Vancomycin Rm: 9

## 2020-11-02 LAB — PH, BODY FLUID: pH, Body Fluid: 7.7

## 2020-11-02 LAB — MAGNESIUM: Magnesium: 2.7 mg/dL — ABNORMAL HIGH (ref 1.7–2.4)

## 2020-11-02 LAB — HEPARIN LEVEL (UNFRACTIONATED): Heparin Unfractionated: 0.69 IU/mL (ref 0.30–0.70)

## 2020-11-02 LAB — PHOSPHORUS: Phosphorus: 3.6 mg/dL (ref 2.5–4.6)

## 2020-11-02 LAB — TRIGLYCERIDES: Triglycerides: 109 mg/dL (ref <150)

## 2020-11-02 MED ORDER — IOHEXOL 300 MG/ML  SOLN
100.0000 mL | Freq: Once | INTRAMUSCULAR | Status: AC | PRN
  Administered 2020-11-02: 100 mL via INTRAVENOUS

## 2020-11-02 MED ORDER — DEXTROSE 50 % IV SOLN: 0.0000 mL | INTRAVENOUS | Status: DC | PRN

## 2020-11-02 MED ORDER — INSULIN ASPART 100 UNIT/ML IJ SOLN
3.0000 [IU] | INTRAMUSCULAR | Status: DC
  Administered 2020-11-02: 9 [IU] via SUBCUTANEOUS
  Administered 2020-11-02 – 2020-11-03 (×2): 6 [IU] via SUBCUTANEOUS
  Administered 2020-11-03: 3 [IU] via SUBCUTANEOUS
  Administered 2020-11-03: 6 [IU] via SUBCUTANEOUS
  Administered 2020-11-03: 3 [IU] via SUBCUTANEOUS
  Administered 2020-11-03: 9 [IU] via SUBCUTANEOUS
  Administered 2020-11-03: 6 [IU] via SUBCUTANEOUS
  Administered 2020-11-04: 9 [IU] via SUBCUTANEOUS
  Administered 2020-11-04: 3 [IU] via SUBCUTANEOUS
  Administered 2020-11-04: 9 [IU] via SUBCUTANEOUS
  Administered 2020-11-04 (×2): 6 [IU] via SUBCUTANEOUS
  Administered 2020-11-04: 9 [IU] via SUBCUTANEOUS
  Administered 2020-11-05 (×3): 3 [IU] via SUBCUTANEOUS
  Administered 2020-11-05 (×2): 6 [IU] via SUBCUTANEOUS
  Administered 2020-11-06 (×2): 3 [IU] via SUBCUTANEOUS
  Administered 2020-11-06: 6 [IU] via SUBCUTANEOUS
  Administered 2020-11-08 – 2020-11-09 (×3): 3 [IU] via SUBCUTANEOUS
  Administered 2020-11-09 (×2): 6 [IU] via SUBCUTANEOUS
  Administered 2020-11-09 – 2020-11-11 (×4): 3 [IU] via SUBCUTANEOUS
  Administered 2020-11-11 (×2): 6 [IU] via SUBCUTANEOUS
  Administered 2020-11-11: 9 [IU] via SUBCUTANEOUS
  Administered 2020-11-12: 6 [IU] via SUBCUTANEOUS
  Administered 2020-11-12: 9 [IU] via SUBCUTANEOUS
  Administered 2020-11-12 – 2020-11-14 (×8): 6 [IU] via SUBCUTANEOUS
  Administered 2020-11-14: 3 [IU] via SUBCUTANEOUS

## 2020-11-02 MED ORDER — INSULIN GLARGINE 100 UNIT/ML ~~LOC~~ SOLN
50.0000 [IU] | Freq: Two times a day (BID) | SUBCUTANEOUS | Status: DC
  Administered 2020-11-02 – 2020-11-04 (×5): 50 [IU] via SUBCUTANEOUS
  Filled 2020-11-02 (×6): qty 0.5

## 2020-11-02 MED ORDER — IOHEXOL 9 MG/ML PO SOLN
250.0000 mL | ORAL | Status: AC
  Administered 2020-11-02: 250 mL via ORAL
  Filled 2020-11-02: qty 500

## 2020-11-02 MED ORDER — INSULIN REGULAR(HUMAN) IN NACL 100-0.9 UT/100ML-% IV SOLN
INTRAVENOUS | Status: DC
  Administered 2020-11-02: 12 [IU]/h via INTRAVENOUS
  Administered 2020-11-02: 10.5 [IU]/h via INTRAVENOUS
  Filled 2020-11-02 (×2): qty 100

## 2020-11-02 MED ORDER — DEXTROSE 10 % IV SOLN: INTRAVENOUS | Status: DC | PRN

## 2020-11-02 MED ORDER — IOHEXOL 9 MG/ML PO SOLN
ORAL | Status: AC
  Administered 2020-11-02: 250 mL via ORAL
  Filled 2020-11-02: qty 1000

## 2020-11-02 MED ORDER — INSULIN ASPART 100 UNIT/ML IJ SOLN
8.0000 [IU] | INTRAMUSCULAR | Status: DC
  Administered 2020-11-02 – 2020-11-03 (×4): 8 [IU] via SUBCUTANEOUS

## 2020-11-02 MED ORDER — VANCOMYCIN HCL 1250 MG/250ML IV SOLN
1250.0000 mg | Freq: Once | INTRAVENOUS | Status: AC
  Administered 2020-11-02: 1250 mg via INTRAVENOUS
  Filled 2020-11-02: qty 250

## 2020-11-02 MED ORDER — VANCOMYCIN HCL IN DEXTROSE 1-5 GM/200ML-% IV SOLN
1000.0000 mg | INTRAVENOUS | Status: DC
  Administered 2020-11-03 – 2020-11-05 (×3): 1000 mg via INTRAVENOUS
  Filled 2020-11-02 (×3): qty 200

## 2020-11-02 MED ORDER — SODIUM CHLORIDE 0.9 % IV SOLN
1.0000 g | Freq: Three times a day (TID) | INTRAVENOUS | Status: DC
  Administered 2020-11-02 – 2020-11-06 (×10): 1 g via INTRAVENOUS
  Filled 2020-11-02 (×13): qty 1

## 2020-11-02 NOTE — Progress Notes (Signed)
Patient transported on vent to CT and returned to [id number] without complications.

## 2020-11-02 NOTE — Progress Notes (Signed)
[facility] NEPHROLOG[facility] PROGRESS NOTE  Assessment/ Plan: Pt is a 65 y.o. yo female  with history of HTN, HLD, peripheral vascular disease status post bilateral BKA, DM, anemia, ESRD on HD MWF at [facility] presented with cough, shortness of breath and generalized weakness, seen as a consultation for the management of dialysis.  Dialysis Orders from previous admission:  [facility] - MWF 4 hrs 400/500 97 kg 2.0K/2.5 Ca 16g needles -Heparin 1000 units initial bolus-heparin pump 1000 units/hr stop 1 hour prior to end of tx  -Epogen 14000 units IV TIW -Hectorol 1.5 mcg IV TIW  #Septic shock due to left lung pneumonia: Patient was transferred to [facility].  Vent and pressor support per CCM.  Abx per primary service  #Vent dependent respiratory failure due to both pneumonia and pulm edema.  Antibiotics as above and volume management with CRRT.  # ESRD MWF schedule.  Now on CRRT because of hemodynamic instability.  Tolerating CRRT well.  On IV heparin for anticoagulation.  UF as tolerated but will keep net even for today given worsening hypotension/inc'ed pressor requirements.   #A. fib with RVR: Currently on amiodarone and heparin.  Cardiology is following.   # Anemia: Hemoglobin 8.1 today.  Continue monitor.  Resume ESA.  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  Resume sevelamer when able to take orally. Phos ok  #Hyponatremia, hypervolemic: improved. Managed with CRRT.  Gean Quint, MD [facility]  Subjective: Seen and examined on CRRT in ICU.  Remains intubated/sedated and on levo/vaso. Worsening hypotension after being bathed earlier. Increased pressor requirements. Running net even. K was 5.2, repeat 4.8. Objective Vital signs in last 24 hours: Vitals:   11/02/20 0500 11/02/20 0515 11/02/20 0530 11/02/20 0545  BP: (!) 123/44     Pulse:  75 72   Resp: (!) _0 (!) 24  Temp: (!) 97 F (36.1 C)     TempSrc: Esophageal     SpO2:  95% 100%    Weight: 85.2 kg     Height:       Weight change: -2.3 kg  Intake/Output Summary (Last 24 hours) at 11/02/2020 0757 Last data filed at 11/02/2020 0700 Gross per 24 hour  Intake 3787.56 ml  Output 6358 ml  Net -2570.44 ml       Labs: Basic Metabolic Panel: Recent Labs  Lab 11/01/20 0308 11/01/20 1600 11/02/20 0341 11/02/20 0741  NA 134* 134* 131* 136  K 4.4 4.4 5.2* 4.8  CL 102 100 98  --   CO2 _1 --   GLUCOSE 238* 118* 306*  --   BUN _2 --   CREATININE 1.75* 1.49* 1.65*  --   CALCIUM 8.1* 8.7* 8.8*  --   PHOS 2.6 2.3* 3.6  --    Liver Function Tests: Recent Labs  Lab 10/28/20 0358 10/30/20 0422 10/31/20 2220 11/01/20 0308 11/01/20 1600 11/02/20 0341  AST 37  --   --  109*  --  74*  ALT 23  --   --  142*  --  117*  ALKPHOS 63  --   --  79  --  93  BILITOT 0.5  --   --  0.8  --  0.8  PROT 6.7  --  6.5 6.5  --  7.7  ALBUMIN 2.5*   < >  --  2.2* 2.3* 2.4*   < > = values in this interval not displayed.   No results for input(s):  LIPASE, AMYLASE in the last 168 hours. No results for input(s): AMMONIA in the last 168 hours. CBC: Recent Labs  Lab 10/27/20 1851 10/28/20 0358 10/29/20 1629 10/29/20 1630 10/30/20 0422 10/31/20 0323 11/01/20 0308 11/02/20 0341 11/02/20 0741  WBC 18.7*   < > 25.5*  --  27.4* 26.4* 27.1* 29.4*  --   NEUTROABS 15.7*  --   --   --   --   --  22.8*  --   --   HGB 9.5*   < > 8.0*   < > 8.6* 8.4* 8.1* 8.9* 10.9*  HCT 32.0*   < > 27.1*   < > 28.7* 28.2* 27.8* 29.9* 32.0*  MCV 81.8   < > 83.4  --  81.8 82.5 82.5 82.1  --   PLT 736*   < > 543*  --  556* 497* 449* 423*  --    < > = values in this interval not displayed.   Cardiac Enzymes: No results for input(s): CKTOTAL, CKMB, CKMBINDEX, TROPONINI in the last 168 hours. CBG: Recent Labs  Lab 11/01/20 2347 11/02/20 0339 11/02/20 0504 11/02/20 0556 11/02/20 0700  GLUCAP 204* 267* 274* 227* 148*    Iron Studies: No results for input(s): IRON, TIBC, TRANSFERRIN,  FERRITIN in the last 72 hours. Studies/Results: DG Chest Port 1 View  Result Date: 10/31/2020 CLINICAL DATA:  Chest tube placement EXAM: PORTABLE CHEST 1 VIEW COMPARISON:  10/31/2020 FINDINGS: Endotracheal tube overlies the distal trachea, 8 mm above the carina. Left neck catheter tip overlies the there is a new left basilar chest tube with significantly decreased size of the left pleural effusion. Unchanged right pleural effusion. Unchanged cardiomediastinal silhouette. Unchanged right axillary stent. Esophageal temperature probe overlies the thoracic inlet. Bones are unchanged. IMPRESSION: Significantly decreased size of the left pleural effusion after chest tube placement. Unchanged cardiomegaly and right pleural effusion. Endotracheal tube tip is again seen in the distal trachea, 8 mm above the carina, consider retraction by 2.0 cm. Electronically Signed   By: Maurine Simmering   On: 10/31/2020 18:43   DG CHEST PORT 1 VIEW  Result Date: 10/31/2020 CLINICAL DATA:  Pleural effusion history of CHF. History of asthma. EXAM: PORTABLE CHEST 1 VIEW COMPARISON:  10/29/2020. FINDINGS: Endotracheal tube noted with tip 1 cm above the carina. Proximal repositioning of approximately 2 cm should be considered. NG tube and left IJ line in stable position. Cardiomegaly with bilateral pulmonary infiltrates/edema and bilateral pleural effusions. Findings consistent with CHF. No pneumothorax. Right subclavian vascular stent noted. IMPRESSION: 1. Endotracheal tube noted with its tip 1 cm above the carina. Proximal repositioning of approximately 2 cm should be considered. NG tube left IJ line in stable position. 2. Cardiomegaly with bilateral pulmonary infiltrates/edema bilateral pleural effusions. Findings consistent CHF. Electronically Signed   By: Marcello Moores  Register   On: 10/31/2020 12:45    Medications: Infusions:   prismasol BGK 4/2.5 400 mL/hr at 11/01/20 2312    prismasol BGK 4/2.5 200 mL/hr at 11/01/20 1033   amiodarone  30 mg/hr (11/02/20 0600)   ceFEPime (MAXIPIME) IV Stopped (11/01/20 2225)   feeding supplement (VITAL 1.5 CAL) 1,000 mL (11/01/20 0947)   fentaNYL infusion INTRAVENOUS 250 mcg/hr (11/02/20 0600)   heparin 1,250 Units/hr (11/02/20 0600)   insulin 9 Units/hr (11/02/20 0600)   norepinephrine (LEVOPHED) Adult infusion 14 mcg/min (11/02/20 0600)   prismasol BGK 4/2.5 1,800 mL/hr at 11/02/20 0619   propofol (DIPRIVAN) infusion 20 mcg/kg/min (11/02/20 0600)   vasopressin 0.03 Units/min (11/02/20 0600)  Scheduled Medications:  aspirin  81 mg Per Tube Daily   atorvastatin  40 mg Per Tube Daily   B-complex with vitamin C  1 tablet Per Tube Daily   chlorhexidine gluconate (MEDLINE KIT)  15 mL Mouth Rinse BID   Chlorhexidine Gluconate Cloth  6 each Topical Q0600   clopidogrel  75 mg Per Tube Daily   darbepoetin (ARANESP) injection - DIALYSIS  100 mcg Intravenous Q Tue-HD   docusate  100 mg Per Tube Daily   feeding supplement (PROSource TF)  90 mL Per Tube TID   fentaNYL (SUBLIMAZE) injection  100 mcg Intravenous Once   fentaNYL (SUBLIMAZE) injection  50 mcg Intravenous Once   guaiFENesin  15 mL Per Tube Q6H   insulin glargine  40 Units Subcutaneous Q12H   ipratropium-albuterol  3 mL Nebulization TID   mouth rinse  15 mL Mouth Rinse 10 times per day   pantoprazole (PROTONIX) IV  40 mg Intravenous Q24H   polyethylene glycol  17 g Per Tube Daily   sodium chloride flush  10 mL Intracatheter Q8H   sodium chloride flush  10-40 mL Intracatheter Q12H    have reviewed scheduled and prn medications.  Physical Exam: General: Critically ill looking female intubated, sedated Heart:RRR, s1s2 nl Lungs: vent, b/l chest expansion Abdomen:soft,  non-distended Extremities: Bilateral BKA, no stump/thigh edema Dialysis Access: Left IJ temporary HD catheter for CRRT.  AV fistula has good thrill and bruit.  Tai Syfert 11/02/2020,7:57 AM  LOS: 6 days

## 2020-11-02 NOTE — Progress Notes (Addendum)
Progress Note    11/02/2020 7:37 PM * No surgery found *  Subjective: Vascular was asked to evaluate patient's left hand.  The patient remains intubated and sedated on multi pressor support.  A line was removed from left radial artery about 48 hours ago.   Vitals:   11/02/20 1915 11/02/20 1930  BP:    Pulse: 79 80  Resp: (!) 24 (!) 24  Temp:    SpO2: 100% 100%   Physical Exam: Lungs: Mechanical ventilation Incisions: Nonhealing right BKA Extremities: Left hand cold and mottled with darkening fingertips; left ulnar Doppler signal about 3 cm above the wrist; radial Doppler signal about 2 cm beyond the antecubital fossa; no dopplerable flow at the wrist or in the hand Neurologic: Sedated  CBC    Component Value Date/Time   WBC 29.4 (H) 11/02/2020 0341   RBC 3.64 (L) 11/02/2020 0341   HGB 10.9 (L) 11/02/2020 0741   HGB 11.0 (L) 10/18/2020 1505   HCT 32.0 (L) 11/02/2020 0741   HCT 36.6 10/18/2020 1505   PLT 423 (H) 11/02/2020 0341   PLT 495 (H) 10/18/2020 1505   MCV 82.1 11/02/2020 0341   MCV 82 10/18/2020 1505   MCH 24.5 (L) 11/02/2020 0341   MCHC 29.8 (L) 11/02/2020 0341   RDW 20.4 (H) 11/02/2020 0341   RDW 16.4 (H) 10/18/2020 1505   LYMPHSABS 3.0 11/01/2020 0308   LYMPHSABS 1.9 10/18/2020 1505   MONOABS 0.8 11/01/2020 0308   EOSABS 0.3 11/01/2020 0308   EOSABS 0.2 10/18/2020 1505   BASOSABS 0.3 (H) 11/01/2020 0308   BASOSABS 0.1 10/18/2020 1505    BMET    Component Value Date/Time   NA 135 11/02/2020 1638   NA 138 10/18/2020 1505   K 4.4 11/02/2020 1638   CL 102 11/02/2020 1638   CO2 22 11/02/2020 1638   GLUCOSE 154 (H) 11/02/2020 1638   BUN 30 (H) 11/02/2020 1638   BUN 13 10/18/2020 1505   CREATININE 2.30 (H) 11/02/2020 1638   CALCIUM 8.3 (L) 11/02/2020 1638   GFRNONAA 23 (L) 11/02/2020 1638   GFRAA 8 (L) 04/06/2020 1113    INR    Component Value Date/Time   INR 1.5 (H) 10/28/2020 0358     Intake/Output Summary (Last 24 hours) at 11/02/2020  1937 Last data filed at 11/02/2020 1900 Gross per 24 hour  Intake 4146.79 ml  Output 3490 ml  Net 656.79 ml     Assessment/Plan:  65 y.o. female now with an ischemic left hand  -Left hand is cold and mottled with darkening fingertips and only dopplerable flow in the ulnar artery 2 to 3 cm above the hand -We will check a left upper extremity arterial duplex -Clinically patient is critically ill with multi pressor support; unlikely she would be able to tolerate surgery or maintain patency at this time; continue heparin and warming of left upper extremity; arterial duplex pending -On-call vascular surgeon will evaluate the patient later today  Dagoberto Ligas, PA-C [facility] [phone] 11/02/2020 7:37 PM  I have seen and evaluated the patient. I agree with the PA note as documented above.  65 year old female well-known to vascular surgery that is currently critically ill in the ICU currently intubated and on multiple pressors with severe septic shock.  Vascular surgery was asked to evaluate her left hand with noted increased mottling.  Most of this appears to be in region of thumb and index finger.  She did have a left radial arterial line earlier  in the week that has since been removed.  On my exam she does appear to have a multiphasic ulnar signal at the left wrist but only a monophasic radial signal in about the mid forearm.  I agree with Dr. Lake Bells that her radial artery is likely occluded with additional pressor induced changes.  She is already on heparin and would titrate that to a therapeutic dose.  We will order left upper extremity arterial duplex in the morning.  Obviously weaning her pressor support should help significantly but this may be difficult with her critical illness.  She is currently on 9 of Levophed and 0.03 of vasopressin.  Vascular will follow.      [name], MD [facility] Office: [phone]

## 2020-11-02 NOTE — Progress Notes (Signed)
[facility] Evening rounds  CT without evidence of a source of infection Send blood cultures, urine culture (given recent urinalysis and culture with multiple cultures)  Stop cefepime Start vancomycin/meropenem  Left hand has become mottled this afternoon, on exam has no cap refill, cool to touch, cyanotic; had an a-line there earlier this admission Ask vascular surgery for input, in meantime keep warm, heparin infusing  Roselie Awkward, MD [facility] Pager: [phone] Cell: [phone] After 7:00 pm call Elink  [phone]

## 2020-11-02 NOTE — Progress Notes (Signed)
Attending:    Subjective: Placed back on insulin drip overnight Remains on mechanical ventilator support Remains on CRRT, vasopressors WBC up Fever overnight Vascular surgery saw her stump yesterday> looks OK  Objective: Vitals:   11/02/20 0945 11/02/20 1000 11/02/20 1015 11/02/20 1030  BP:  (!) 144/63  (!) 146/44  Pulse: 87 (!) 132 (!) 121 (!) 121  Resp: (!) 24 (!) 25 (!) 26 (!) 24  Temp:    (!) 100.4 F (38 C)  TempSrc:    Esophageal  SpO2: 100% 100% 100% 100%  Weight:      Height:       Vent Mode: PRVC FiO2 (%):  [40 %] 40 % Set Rate:  [24 bmp] 24 bmp Vt Set:  [440 mL] 440 mL PEEP:  [8 cmH20] 8 cmH20 Plateau Pressure:  [22 cmH20-26 cmH20] 22 cmH20  Intake/Output Summary (Last 24 hours) at 11/02/2020 1039 Last data filed at 11/02/2020 1010 Gross per 24 hour  Intake 3871.24 ml  Output 5992 ml  Net -2120.76 ml    General:  In bed on vent HENT: NCAT ETT in place PULM: CTA B, vent supported breathing CV: RRR, no mgr GI: BS+, soft, nontender MSK: normal bulk and tone Neuro: sedated on vent   Pleural fluid culture negative   CBC    Component Value Date/Time   WBC 29.4 (H) 11/02/2020 0341   RBC 3.64 (L) 11/02/2020 0341   HGB 10.9 (L) 11/02/2020 0741   HGB 11.0 (L) 10/18/2020 1505   HCT 32.0 (L) 11/02/2020 0741   HCT 36.6 10/18/2020 1505   PLT 423 (H) 11/02/2020 0341   PLT 495 (H) 10/18/2020 1505   MCV 82.1 11/02/2020 0341   MCV 82 10/18/2020 1505   MCH 24.5 (L) 11/02/2020 0341   MCHC 29.8 (L) 11/02/2020 0341   RDW 20.4 (H) 11/02/2020 0341   RDW 16.4 (H) 10/18/2020 1505   LYMPHSABS 3.0 11/01/2020 0308   LYMPHSABS 1.9 10/18/2020 1505   MONOABS 0.8 11/01/2020 0308   EOSABS 0.3 11/01/2020 0308   EOSABS 0.2 10/18/2020 1505   BASOSABS 0.3 (H) 11/01/2020 0308   BASOSABS 0.1 10/18/2020 1505    BMET    Component Value Date/Time   NA 136 11/02/2020 0741   NA 138 10/18/2020 1505   K 4.8 11/02/2020 0741   CL 98 11/02/2020 0341   CO2 22 11/02/2020 0341    GLUCOSE 306 (H) 11/02/2020 0341   BUN 21 11/02/2020 0341   BUN 13 10/18/2020 1505   CREATININE 1.65 (H) 11/02/2020 0341   CALCIUM 8.8 (L) 11/02/2020 0341   GFRNONAA 35 (L) 11/02/2020 0341   GFRAA 8 (L) 04/06/2020 1113    CXR images personally reviewed>no infiltrate, left pigtail in place  Impression/Plan: Severe CAP causing septic shock > at this point it's not clear to me that this is all pneumonia as her WBC is rising and she remains febrile.  Will order a CT chest/abdomen/pelvis today to look for another source of infection, vascular feels her stump is not infected; continue cefepime for now, if the CT is negative then will re-culture and broaden out antibiotics, continue levophed and vasopressin for shock Acute respiratory failure with hypoxemia > continue full vent support, VAP prevention ESRD> continue CRRT and volume removal today Atrial fibrillation>  Hyperglycemia with DM2> change back to insulin drip, increase glargine, add tube feeding coverage Pleural fluid > resolved, remove chest tube today  My cc time 40 minutes  Roselie Awkward, MD [place] [facility] [facility] Pager: [phone] Cell: [phone]  After 7pm: [phone]

## 2020-11-02 NOTE — Progress Notes (Signed)
ANTICOAGULATION CONSULT NOTE  Pharmacy Consult for Heparin  Indication: atrial fibrillation  Allergies  Allergen Reactions   Sulfa Antibiotics Swelling    Patient Measurements: Height: '5\' 4"'$  (162.6 cm) Weight: 85.2 kg (187 lb 13.3 oz) IBW/kg (Calculated) : 54.7   Vital Signs: Temp: 100 F (37.8 C) (07/07 1230) Temp Source: Esophageal (07/07 1230) BP: 155/53 (07/07 1300) Pulse Rate: 128 (07/07 1300)  Labs: Recent Labs    10/31/20 0323 10/31/20 1600 11/01/20 0308 11/01/20 1600 11/02/20 0341 11/02/20 0741  HGB 8.4*  --  8.1*  --  8.9* 10.9*  HCT 28.2*  --  27.8*  --  29.9* 32.0*  PLT 497*  --  449*  --  423*  --   APTT 91*  --  104*  --   --   --   HEPARINUNFRC 0.34  --  0.31  --  0.69  --   CREATININE 2.13*   < > 1.75* 1.49* 1.65*  --    < > = values in this interval not displayed.     Estimated Creatinine Clearance: 36.9 mL/min (A) (by C-G formula based on SCr of 1.65 mg/dL (H)).   Medical History: Past Medical History:  Diagnosis Date   Anemia    Arthritis    Asthma    in the past   CHF (congestive heart failure) ([place])    Diabetes mellitus with complication ([place])    Type 2   End stage renal disease on dialysis ([place])    Gangrene (HCC)    Hyperlipidemia    Hypertension     Assessment: 65 y/o F with respiratory distress/pulmonary edema requiring transfered to the ICU and intubated, started on CRRT (ESRD).  She is also noted to be in afib/flutter  > SR on IV amiodarone IV heparin per pharmacy Heparin drip 1250 uts/hr in PIV and HL 0.69 - top of goal but a jump from last few days HL 0.3 drawn from CL.   No bleeding hgb stable   Goal of Therapy:  Heparin level 0.3-0.7 units/ml Monitor platelets by anticoagulation protocol: Yes   Plan:  Decrease heparin at 1200 units/hr  Daily CBC/Heparin level    Bonnita Nasuti Pharm.D. CPP, BCPS Clinical Pharmacist [phone] 11/02/2020 2:24 PM   **Pharmacist phone directory can now be found on [email or web address] (PW  [email or web address]).  Listed under [place].

## 2020-11-02 NOTE — Progress Notes (Signed)
Pulled ET tube back 3 cm from 26 to 23 cm.  Tolerated well.

## 2020-11-02 NOTE — Progress Notes (Signed)
Pharmacy Antibiotic Note  Christy Zhang is a 65 y.o. female admitted on 10/27/2020 with AMS, severe CAP and shock. Rising WBC and fevers on Cefepime - pharmacy consulted to broaden to Vancomycin + Meropenem.   The patient is normally ESRD but has been receiving CRRT since 7/3. Noted off time earlier today d/t clot and imaging for ~7h but prior to that had been tolerating better with minimal off time.   Last Vancomycin dose 1g on 7/4 PM. A random Vancomycin level was checked to see if any residual remained - resulted as 9 mcg/ml. Will give a smaller LD and resume Vancomycin dosing.   Plan: - Vanc 1250 mg IV x 1 followed by 1g IV every 24 hours - Start Meropenem 1g IV every 8 hours - Will continue to follow CRRT tolerance, culture results, LOT, and antibiotic de-escalation plans   Height: '5\' 4"'$  (162.6 cm) Weight: 85.2 kg (187 lb 13.3 oz) IBW/kg (Calculated) : 54.7  Temp (24hrs), Avg:98.1 F (36.7 C), Min:97 F (36.1 C), Max:100.4 F (38 C)  Recent Labs  Lab 10/27/20 1927 10/27/20 2150 10/28/20 0133 10/28/20 0358 10/29/20 1528 10/29/20 1629 10/30/20 0422 10/30/20 1600 10/31/20 0323 10/31/20 1600 11/01/20 0308 11/01/20 1600 11/02/20 0341 11/02/20 1638  WBC  --   --   --  17.5*  --  25.5* 27.4*  --  26.4*  --  27.1*  --  29.4*  --   CREATININE  --   --   --  6.31*   < >  --  3.42*   < > 2.13* 2.04* 1.75* 1.49* 1.65* 2.30*  LATICACIDVEN 2.9* 2.0* 2.0* 1.3  --   --   --   --   --   --   --   --   --   --    < > = values in this interval not displayed.    Estimated Creatinine Clearance: 26.4 mL/min (A) (by C-G formula based on SCr of 2.3 mg/dL (H)).    Allergies  Allergen Reactions   Sulfa Antibiotics Swelling    Antimicrobials this admission: 7/1 vanc >> 7/4 7/1 cefepime >> 7/7 7/1 Flagyl x 1  Microbiology results: 7/1 BCx x 2 > ngtd 7/1 UCx > multiple species 7/3 MRSA PCR- neg 7/5 pleural fluid- ngtd 7/7 RCx (TA) >> 7/7 BCx >> 7/7 UCx >>  Thank you for  allowing pharmacy to be a part of this patient's care.  Alycia Rossetti, PharmD, BCPS Clinical Pharmacist Clinical phone for 11/02/2020: [phone] 11/02/2020 6:43 PM   **Pharmacist phone directory can now be found on [email or web address] (PW [email or web address]).  Listed under [place].

## 2020-11-02 NOTE — Progress Notes (Signed)
NAME:  Christy Zhang, MRN:  [id number], DOB:  03-20-1956, LOS: 6 ADMISSION DATE:  10/27/2020, CONSULTATION DATE:  7/3 REFERRING MD:  Tyrell Antonio, CHIEF COMPLAINT:  dyspnea   Brief History of Present Illness:  65 y/o female admitted for presumed pneumonia and hypotension, required intubation, mechanical ventilation and CRRT in the setting of septic shock from pneumonia cauting acute respiratory failure with hypoxemia and AKI.  Pertinent  Medical History  CHF CKD T2DM ESRD HLD HTN  Significant Hospital Events: Including procedures, antibiotic start and stop dates in addition to other pertinent events   Admitted to hospital on 7/1 7/1 cefepime >  7/1 flagyl x1 7/1 vancomycin>  Admitted to ICU on 10/29/20 for CRRT 7/3 - brief PEA with bearing down while agitated. 7/3 ETT 7/3 left radial arterial line 7/3 L IJ HD cath 7/5 L pigtail CT placed for L pleural effusion 7/6 CT output -550 cc    Interim History / Subjective:   Glucose range 131-274 on basal insulin and insulin drip Afebrile; WBC 29.4 (27.1) Vaso and levo 16 Sedate on 15 prop and fent 250 On heparin and amio; sinus tachycardia improved to NSR with fentanyl bolus Remains intubated on mech vent; failed SBT (tachycardia and apnea)   Objective   Blood pressure (!) 123/44, pulse 72, temperature (!) 97 F (36.1 C), temperature source Esophageal, resp. rate (!) 24, height '5\' 4"'$  (1.626 m), weight 85.2 kg, SpO2 100 %.    Vent Mode: PRVC FiO2 (%):  [40 %] 40 % Set Rate:  [24 bmp] 24 bmp Vt Set:  [440 mL] 440 mL PEEP:  [8 cmH20] 8 cmH20 Plateau Pressure:  [23 cmH20-26 cmH20] 23 cmH20   Intake/Output Summary (Last 24 hours) at 11/02/2020 0746 Last data filed at 11/02/2020 0700 Gross per 24 hour  Intake 3787.56 ml  Output 6358 ml  Net -2570.44 ml    Filed Weights   10/31/20 0500 11/01/20 0500 11/02/20 0500  Weight: 92 kg 87.5 kg 85.2 kg    Examination: General:  critcally ill apearing on mech vent HEENT: MM  pink/moist; ETT in place Neuro: sedated CV: s1s2, sinus tach, no m/r/g PULM:  dim bs bilaterally; L pigtail CT in place w/ -20 cc output; on mech vent PRVC GI: soft, bsx4 active  Extremities: warm/dry, bilateral BKA Skin: no rashes or lesions  7/1 blood culture > negative 7/1 urine culture > multiple species 7/2 SARS cov 2/flu > neg 7/7 trach culture >  CXR:  personally reviewed: left  pitgtail in place; improving L pleural effusion; ETT in place  Resolved Hospital Problem list     Assessment & Plan:  Acute respiratory failure with hypercarbia and hypoxemia due to severe community acquired pneumonia Full mechanical vent support L pleural effusion: Lights criteria 98% positive; pleural cultures sent 7/6 P: -continue mech vent support: PRVC -daily SBTs/SAT -VAP prevention -Plan to remove Chest tube (pleural effusion resolved; only 20cc output) -follow pleural cultures -will resend trach cultures -continue cefepime -CXR am  Septic shock due to CAP P: -wean pressors for MAP >65 -CT abdomen ordered to evaluate for any source of infection -continue cefepime; consider broadening abx and repeat Bcx2 pending CT abdomen results -trend fever/wbc curve -follow cultures; will resend trach aspirate -Vascular evaluated BKA wound and believes likely not the source of infection  Need for sedation for mechanical ventilation P: -continue sedation for RASS goal -2 -PAD protocol  Constipation P: -continue bowel regimen today  ESRD P: -Nephro following -continue CRRT w/ volume removal -  trend bmp and uop  PAD P: -continue statin and plavix  Atrial filbrillation P: -continuous telemetry -continue IV heparin and IV amio   DM2 with hyperglycemia P: -increasing basal insulin at 50; continue insulin drip -CBG monitoring -continue tube feeds   Best Practice (right click and "Reselect all SmartList Selections" daily)   Diet/type: tubefeeds DVT prophylaxis: systemic  heparin GI prophylaxis: PPI Lines: yes and it is still needed Foley:  N/A Code Status:  full code Last date of multidisciplinary goals of care discussion [7/6 updated daughter by phone]  Labs   CBC: Recent Labs  Lab 10/27/20 1851 10/28/20 0358 10/29/20 1629 10/29/20 1630 10/30/20 0422 10/31/20 0323 11/01/20 0308 11/02/20 0341  WBC 18.7*   < > 25.5*  --  27.4* 26.4* 27.1* 29.4*  NEUTROABS 15.7*  --   --   --   --   --  22.8*  --   HGB 9.5*   < > 8.0* 10.2* 8.6* 8.4* 8.1* 8.9*  HCT 32.0*   < > 27.1* 30.0* 28.7* 28.2* 27.8* 29.9*  MCV 81.8   < > 83.4  --  81.8 82.5 82.5 82.1  PLT 736*   < > 543*  --  556* 497* 449* 423*   < > = values in this interval not displayed.     Basic Metabolic Panel: Recent Labs  Lab 10/30/20 0422 10/30/20 1600 10/30/20 1610 10/31/20 0323 10/31/20 1600 11/01/20 0308 11/01/20 1600 11/02/20 0341  NA 131*   < >  --  134* 135 134* 134* 131*  K 4.7   < >  --  4.6 4.3 4.4 4.4 5.2*  CL 100   < >  --  101 102 102 100 98  CO2 22   < >  --  '22 24 23 24 22  '$ GLUCOSE 199*   < >  --  262* 291* 238* 118* 306*  BUN 17   < >  --  '16 18 19 15 21  '$ CREATININE 3.42*   < >  --  2.13* 2.04* 1.75* 1.49* 1.65*  CALCIUM 7.9*   < >  --  8.2* 7.8* 8.1* 8.7* 8.8*  MG 2.2  --  2.3 2.4  --  2.3  --  2.7*  PHOS 2.9   < >  --  2.7 1.9* 2.6 2.3* 3.6   < > = values in this interval not displayed.    GFR: Estimated Creatinine Clearance: 36.9 mL/min (A) (by C-G formula based on SCr of 1.65 mg/dL (H)). Recent Labs  Lab 10/27/20 1927 10/27/20 2150 10/28/20 0133 10/28/20 0358 10/29/20 1629 10/30/20 0422 10/31/20 0323 11/01/20 0308 11/02/20 0341  PROCALCITON  --   --   --  4.20  --   --   --   --   --   WBC  --   --   --  17.5*   < > 27.4* 26.4* 27.1* 29.4*  LATICACIDVEN 2.9* 2.0* 2.0* 1.3  --   --   --   --   --    < > = values in this interval not displayed.     Liver Function Tests: Recent Labs  Lab 10/27/20 1851 10/28/20 0358 10/30/20 0422  10/31/20 0323 10/31/20 1600 10/31/20 2220 11/01/20 0308 11/01/20 1600 11/02/20 0341  AST 30 37  --   --   --   --  109*  --  74*  ALT 18 23  --   --   --   --  142*  --  117*  ALKPHOS 86 63  --   --   --   --  79  --  93  BILITOT 0.5 0.5  --   --   --   --  0.8  --  0.8  PROT 7.6 6.7  --   --   --  6.5 6.5  --  7.7  ALBUMIN 2.8* 2.5*   < > 2.1* 2.0*  --  2.2* 2.3* 2.4*   < > = values in this interval not displayed.    No results for input(s): LIPASE, AMYLASE in the last 168 hours. No results for input(s): AMMONIA in the last 168 hours.  ABG    Component Value Date/Time   PHART 7.302 (L) 10/29/2020 1630   PCO2ART 45.6 10/29/2020 1630   PO2ART 249 (H) 10/29/2020 1630   HCO3 23.0 10/29/2020 1630   TCO2 24 10/29/2020 1630   ACIDBASEDEF 4.0 (H) 10/29/2020 1630   O2SAT 100.0 10/29/2020 1630      Coagulation Profile: Recent Labs  Lab 10/27/20 1851 10/28/20 0358  INR 1.5* 1.5*     Cardiac Enzymes: No results for input(s): CKTOTAL, CKMB, CKMBINDEX, TROPONINI in the last 168 hours.  HbA1C: HB A1C (BAYER DCA - WAIVED)  Date/Time Value Ref Range Status  04/06/2020 11:05 AM 5.4 <7.0 % Final    Comment:                                          Diabetic Adult            <7.0                                       Healthy Adult        4.3 - 5.7                                                           (DCCT/NGSP) American Diabetes Association's Summary of Glycemic Recommendations for Adults with Diabetes: Hemoglobin A1c <7.0%. More stringent glycemic goals (A1c <6.0%) may further reduce complications at the cost of increased risk of hypoglycemia.   01/06/2020 10:13 AM 6.2 <7.0 % Final    Comment:                                          Diabetic Adult            <7.0                                       Healthy Adult        4.3 - 5.7                                                           (  DCCT/NGSP) American Diabetes Association's Summary of Glycemic  Recommendations for Adults with Diabetes: Hemoglobin A1c <7.0%. More stringent glycemic goals (A1c <6.0%) may further reduce complications at the cost of increased risk of hypoglycemia.    Hgb A1c MFr Bld  Date/Time Value Ref Range Status  10/27/2020 06:48 PM 6.4 (H) 4.8 - 5.6 % Final    Comment:    (NOTE)         Prediabetes: 5.7 - 6.4         Diabetes: >6.4         Glycemic control for adults with diabetes: <7.0   08/09/2020 05:23 AM 6.5 (H) 4.8 - 5.6 % Final    Comment:    (NOTE) Pre diabetes:          5.7%-6.4%  Diabetes:              >6.4%  Glycemic control for   <7.0% adults with diabetes     CBG: Recent Labs  Lab 11/01/20 2347 11/02/20 0339 11/02/20 0504 11/02/20 0556 11/02/20 0700  GLUCAP 204* 267* 274* 227* 148*     Critical care time: 35 minutes    JD Geryl [facility] 11/02/2020, 7:46 AM  Please see [email or web address] for pager details.  From 7A-7P if no response, please call [phone]. After hours, please call ELink [phone].

## 2020-11-02 NOTE — Progress Notes (Signed)
CRRT filter clotted at 0930. CRRT stopped. CT ordered so CRRT held until after CT per Dr Lake Bells. Will re-start CRRT upon return from CT scan.

## 2020-11-03 ENCOUNTER — Inpatient Hospital Stay (HOSPITAL_COMMUNITY): Payer: Medicare Other

## 2020-11-03 DIAGNOSIS — I480 Paroxysmal atrial fibrillation: Secondary | ICD-10-CM | POA: Diagnosis not present

## 2020-11-03 DIAGNOSIS — R209 Unspecified disturbances of skin sensation: Secondary | ICD-10-CM | POA: Diagnosis not present

## 2020-11-03 DIAGNOSIS — J189 Pneumonia, unspecified organism: Secondary | ICD-10-CM | POA: Diagnosis not present

## 2020-11-03 DIAGNOSIS — I21A9 Other myocardial infarction type: Secondary | ICD-10-CM

## 2020-11-03 LAB — COMPREHENSIVE METABOLIC PANEL
ALT: 75 U/L — ABNORMAL HIGH (ref 0–44)
AST: 84 U/L — ABNORMAL HIGH (ref 15–41)
Albumin: 2.1 g/dL — ABNORMAL LOW (ref 3.5–5.0)
Alkaline Phosphatase: 84 U/L (ref 38–126)
Anion gap: 9 (ref 5–15)
BUN: 22 mg/dL (ref 8–23)
CO2: 23 mmol/L (ref 22–32)
Calcium: 8.7 mg/dL — ABNORMAL LOW (ref 8.9–10.3)
Chloride: 102 mmol/L (ref 98–111)
Creatinine, Ser: 1.68 mg/dL — ABNORMAL HIGH (ref 0.44–1.00)
GFR, Estimated: 34 mL/min — ABNORMAL LOW (ref >60)
Glucose, Bld: 200 mg/dL — ABNORMAL HIGH (ref 70–99)
Potassium: 5 mmol/L (ref 3.5–5.1)
Sodium: 134 mmol/L — ABNORMAL LOW (ref 135–145)
Total Bilirubin: 1.1 mg/dL (ref 0.3–1.2)
Total Protein: 7.1 g/dL (ref 6.5–8.1)

## 2020-11-03 LAB — RENAL FUNCTION PANEL
Albumin: 2.1 g/dL — ABNORMAL LOW (ref 3.5–5.0)
Anion gap: 9 (ref 5–15)
BUN: 20 mg/dL (ref 8–23)
CO2: 24 mmol/L (ref 22–32)
Calcium: 8.3 mg/dL — ABNORMAL LOW (ref 8.9–10.3)
Chloride: 99 mmol/L (ref 98–111)
Creatinine, Ser: 1.51 mg/dL — ABNORMAL HIGH (ref 0.44–1.00)
GFR, Estimated: 39 mL/min — ABNORMAL LOW (ref >60)
Glucose, Bld: 252 mg/dL — ABNORMAL HIGH (ref 70–99)
Phosphorus: 3 mg/dL (ref 2.5–4.6)
Potassium: 4.6 mmol/L (ref 3.5–5.1)
Sodium: 132 mmol/L — ABNORMAL LOW (ref 135–145)

## 2020-11-03 LAB — HEPATIC FUNCTION PANEL
ALT: 77 U/L — ABNORMAL HIGH (ref 0–44)
AST: 87 U/L — ABNORMAL HIGH (ref 15–41)
Albumin: 2.1 g/dL — ABNORMAL LOW (ref 3.5–5.0)
Alkaline Phosphatase: 85 U/L (ref 38–126)
Bilirubin, Direct: 0.2 mg/dL (ref 0.0–0.2)
Indirect Bilirubin: 0.6 mg/dL (ref 0.3–0.9)
Total Bilirubin: 0.8 mg/dL (ref 0.3–1.2)
Total Protein: 7.4 g/dL (ref 6.5–8.1)

## 2020-11-03 LAB — GLUCOSE, CAPILLARY
Glucose-Capillary: 129 mg/dL — ABNORMAL HIGH (ref 70–99)
Glucose-Capillary: 137 mg/dL — ABNORMAL HIGH (ref 70–99)
Glucose-Capillary: 174 mg/dL — ABNORMAL HIGH (ref 70–99)
Glucose-Capillary: 189 mg/dL — ABNORMAL HIGH (ref 70–99)
Glucose-Capillary: 196 mg/dL — ABNORMAL HIGH (ref 70–99)
Glucose-Capillary: 256 mg/dL — ABNORMAL HIGH (ref 70–99)
Glucose-Capillary: 261 mg/dL — ABNORMAL HIGH (ref 70–99)

## 2020-11-03 LAB — CBC
HCT: 26.1 % — ABNORMAL LOW (ref 36.0–46.0)
Hemoglobin: 7.7 g/dL — ABNORMAL LOW (ref 12.0–15.0)
MCH: 23.9 pg — ABNORMAL LOW (ref 26.0–34.0)
MCHC: 29.5 g/dL — ABNORMAL LOW (ref 30.0–36.0)
MCV: 81.1 fL (ref 80.0–100.0)
Platelets: 377 10*3/uL (ref 150–400)
RBC: 3.22 MIL/uL — ABNORMAL LOW (ref 3.87–5.11)
RDW: 20.9 % — ABNORMAL HIGH (ref 11.5–15.5)
WBC: 28.2 10*3/uL — ABNORMAL HIGH (ref 4.0–10.5)
nRBC: 0.7 % — ABNORMAL HIGH (ref 0.0–0.2)

## 2020-11-03 LAB — TROPONIN I (HIGH SENSITIVITY)
Troponin I (High Sensitivity): 119 ng/L (ref <18)
Troponin I (High Sensitivity): 115 ng/L (ref <18)

## 2020-11-03 LAB — ECHOCARDIOGRAM LIMITED
Area-P 1/2: 2.66 cm2
Height: 64 in
S' Lateral: 2.7 cm
Weight: 3058.22 oz

## 2020-11-03 LAB — HEPARIN LEVEL (UNFRACTIONATED)
Heparin Unfractionated: 0.8 IU/mL — ABNORMAL HIGH (ref 0.30–0.70)
Heparin Unfractionated: 0.56 IU/mL (ref 0.30–0.70)

## 2020-11-03 LAB — CYTOLOGY - NON PAP

## 2020-11-03 LAB — MAGNESIUM: Magnesium: 2.5 mg/dL — ABNORMAL HIGH (ref 1.7–2.4)

## 2020-11-03 LAB — PHOSPHORUS: Phosphorus: 3.3 mg/dL (ref 2.5–4.6)

## 2020-11-03 MED ORDER — PROSOURCE TF PO LIQD
45.0000 mL | Freq: Four times a day (QID) | ORAL | Status: DC
  Administered 2020-11-03 – 2020-11-06 (×11): 45 mL
  Filled 2020-11-03 (×9): qty 45

## 2020-11-03 MED ORDER — PRISMASOL BGK 0/2.5 32-2.5 MEQ/L EC SOLN
Status: DC
  Filled 2020-11-03 (×33): qty 5000

## 2020-11-03 MED ORDER — VITAL 1.5 CAL PO LIQD
1000.0000 mL | ORAL | Status: DC
  Administered 2020-11-03 – 2020-11-08 (×5): 1000 mL
  Filled 2020-11-03 (×4): qty 1000

## 2020-11-03 MED ORDER — PHENYLEPHRINE HCL-NACL 10-0.9 MG/250ML-% IV SOLN
0.0000 ug/min | INTRAVENOUS | Status: DC
  Administered 2020-11-03: 20 ug/min via INTRAVENOUS
  Administered 2020-11-03: 45 ug/min via INTRAVENOUS
  Administered 2020-11-03: 20 ug/min via INTRAVENOUS
  Administered 2020-11-03: 50 ug/min via INTRAVENOUS
  Administered 2020-11-04: 30 ug/min via INTRAVENOUS
  Filled 2020-11-03 (×3): qty 250
  Filled 2020-11-03: qty 500
  Filled 2020-11-03: qty 250

## 2020-11-03 MED ORDER — BISACODYL 10 MG RE SUPP: 10.0000 mg | Freq: Every day | RECTAL | Status: DC | PRN

## 2020-11-03 MED ORDER — SODIUM CHLORIDE 0.9 % IV SOLN
INTRAVENOUS | Status: DC | PRN
  Administered 2020-11-03 – 2020-11-07 (×3): 250 mL via INTRAVENOUS

## 2020-11-03 MED ORDER — ASPIRIN 81 MG PO CHEW
325.0000 mg | CHEWABLE_TABLET | Freq: Once | ORAL | Status: AC
Start: 2020-11-03 — End: 2020-11-03
  Administered 2020-11-03: 325 mg via NASOGASTRIC
  Filled 2020-11-03: qty 5

## 2020-11-03 MED ORDER — PANTOPRAZOLE SODIUM 40 MG PO PACK
40.0000 mg | PACK | Freq: Every day | ORAL | Status: DC
  Administered 2020-11-04 – 2020-11-06 (×3): 40 mg
  Filled 2020-11-03 (×3): qty 20

## 2020-11-03 MED ORDER — INSULIN ASPART 100 UNIT/ML IJ SOLN
4.0000 [IU] | INTRAMUSCULAR | Status: DC
  Administered 2020-11-03 – 2020-11-06 (×17): 4 [IU] via SUBCUTANEOUS

## 2020-11-03 MED ORDER — INSULIN ASPART 100 UNIT/ML IJ SOLN: 6.0000 [IU] | INTRAMUSCULAR | Status: DC

## 2020-11-03 NOTE — Progress Notes (Signed)
ANTICOAGULATION CONSULT NOTE  Pharmacy Consult for Heparin  Indication: atrial fibrillation  Allergies  Allergen Reactions   Sulfa Antibiotics Swelling    Patient Measurements: Height: '5\' 4"'$  (162.6 cm) Weight: 85.2 kg (187 lb 13.3 oz) IBW/kg (Calculated) : 54.7   Vital Signs: Temp: 97.3 F (36.3 C) (07/08 0400) Temp Source: Esophageal (07/08 0400) BP: 142/52 (07/07 1900) Pulse Rate: 129 (07/08 0500)  Labs: Recent Labs    11/01/20 0308 11/01/20 1600 11/02/20 0341 11/02/20 0741 11/02/20 1638 11/03/20 0401  HGB 8.1*  --  8.9* 10.9*  --  7.7*  HCT 27.8*  --  29.9* 32.0*  --  26.1*  PLT 449*  --  423*  --   --  377  APTT 104*  --   --   --   --   --   HEPARINUNFRC 0.31  --  0.69  --   --  0.80*  CREATININE 1.75*   < > 1.65*  --  2.30* 1.68*   < > = values in this interval not displayed.     Estimated Creatinine Clearance: 36.2 mL/min (A) (by C-G formula based on SCr of 1.68 mg/dL (H)).   Medical History: Past Medical History:  Diagnosis Date   Anemia    Arthritis    Asthma    in the past   CHF (congestive heart failure) ([place])    Diabetes mellitus with complication ([place])    Type 2   End stage renal disease on dialysis ([place])    Gangrene (HCC)    Hyperlipidemia    Hypertension     Assessment: 65 y/o F with respiratory distress/pulmonary edema requiring transfered to the ICU and intubated, started on CRRT (ESRD).  She is also noted to be in afib/flutter  > SR on IV amiodarone IV heparin per pharmacy  Heparin level supratherapeutic (0.8) on gtt at 1200 units/hr. No bleeding noted per RN. Hgb down to 7.7 (was in the 8s, wondering if 10.9 yesterday was inaccurate).  Goal of Therapy:  Heparin level 0.3-0.7 units/ml Monitor platelets by anticoagulation protocol: Yes   Plan:  Decrease heparin to 1050 units/hr  Will f/u 8 hr heparin level  Sherlon Handing, PharmD, BCPS Please see [email or web address] for complete clinical pharmacist phone list 11/03/2020 5:03  AM

## 2020-11-03 NOTE — Progress Notes (Signed)
Upper extremity arterial left study completed.  Preliminary results relayed to Lake Bells, MD and [place], [place].  See CV Proc for preliminary results report.   Darlin Coco, RDMS, RVT

## 2020-11-03 NOTE — Progress Notes (Signed)
Critical EKG called to [facility] dd/t STEMI findings on 12-lead EKG. See new orders.

## 2020-11-03 NOTE — Progress Notes (Signed)
Critical troponin 119, called to RN at [phone]. ELink made aware at [phone].

## 2020-11-03 NOTE — Progress Notes (Addendum)
[facility] NEPHROLOGY PROGRESS NOTE  Assessment/ Plan: Pt is a 65 y.o. yo female  with history of HTN, HLD, peripheral vascular disease status post bilateral BKA, DM, anemia, ESRD on HD MWF at [facility] presented with cough, shortness of breath and generalized weakness, seen as a consultation for the management of dialysis.  Dialysis Orders from previous admission:  [facility] - MWF 4 hrs 400/500 97 kg 2.0K/2.5 Ca 16g needles -Heparin 1000 units initial bolus-heparin pump 1000 units/hr stop 1 hour prior to end of tx  -Epogen 14000 units IV TIW -Hectorol 1.5 mcg IV TIW  #Septic shock due to left lung pneumonia: Patient was transferred to [facility].  Vent and pressor support per CCM.  Abx per primary service. Ct 7/7 without source of infection  #Possible inferior STEMI -not a candidate for intervention. On dapt and hep gtt.  #Possible left radial artery occlusion -vvs following, a/c per primary service  #Vent dependent respiratory failure due to both pneumonia and pulm edema.  Antibiotics as above and volume management with CRRT.  # ESRD MWF schedule.  Now on CRRT because of hemodynamic instability.  C/w CRRT.  On IV heparin for anticoagulation.  Keep net even for now but can try for net neg 50cc/hr -changing dialysate bags to 2k/2.5cal, k 5.0 this am  #A. fib with RVR: Currently on amiodarone and heparin.  Cardiology is following.   # Anemia: Hemoglobin 7.7 today.  Continue monitor.  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  #Hyponatremia, hypervolemic: improved/stable. Managed with CRRT.  #Pleural effusion -chest tube place 7/5, removed 7/7  Gean Quint, MD [facility]  Subjective: Seen and examined on CRRT in ICU.  Remains intubated/sedated. Levo switched to phenyl due to afib episodes. Possible STE on ekg but not a candidate for intervention. Lt hand now mottled due to possible occlusion of left radial artery  secondary to aline. Running net even on CRRT. Net positive ~1.8L Now on vanc and meropenem. Objective Vital signs in last 24 hours: Vitals:   11/03/20 0700 11/03/20 0741 11/03/20 0750 11/03/20 0753  BP:   (!) 114/43   Pulse: 73  89   Resp: (!) 24  16   Temp:  (!) 97.3 F (36.3 C)    TempSrc:      SpO2: 100% 100% 100% 98%  Weight:      Height:       Weight change: 1.5 kg  Intake/Output Summary (Last 24 hours) at 11/03/2020 0804 Last data filed at 11/03/2020 0700 Gross per 24 hour  Intake 4334.11 ml  Output 2508 ml  Net 1826.11 ml       Labs: Basic Metabolic Panel: Recent Labs  Lab 11/02/20 0341 11/02/20 0741 11/02/20 1638 11/03/20 0401  NA 131* 136 135 134*  K 5.2* 4.8 4.4 5.0  CL 98  --  102 102  CO2 22  --  22 23  GLUCOSE 306*  --  154* 200*  BUN 21  --  30* 22  CREATININE 1.65*  --  2.30* 1.68*  CALCIUM 8.8*  --  8.3* 8.7*  PHOS 3.6  --  5.6* 3.3   Liver Function Tests: Recent Labs  Lab 11/01/20 0308 11/01/20 1600 11/02/20 0341 11/02/20 1638 11/03/20 0401  AST 109*  --  74*  --  84*  ALT 142*  --  117*  --  75*  ALKPHOS 79  --  93  --  84  BILITOT 0.8  --  0.8  --  1.1  PROT 6.5  --  7.7  --  7.1  ALBUMIN 2.2*   < > 2.4* 2.1* 2.1*   < > = values in this interval not displayed.   No results for input(s): LIPASE, AMYLASE in the last 168 hours. No results for input(s): AMMONIA in the last 168 hours. CBC: Recent Labs  Lab 10/27/20 1851 10/28/20 0358 10/30/20 0422 10/31/20 0323 11/01/20 0308 11/02/20 0341 11/02/20 0741 11/03/20 0401  WBC 18.7*   < > 27.4* 26.4* 27.1* 29.4*  --  28.2*  NEUTROABS 15.7*  --   --   --  22.8*  --   --   --   HGB 9.5*   < > 8.6* 8.4* 8.1* 8.9* 10.9* 7.7*  HCT 32.0*   < > 28.7* 28.2* 27.8* 29.9* 32.0* 26.1*  MCV 81.8   < > 81.8 82.5 82.5 82.1  --  81.1  PLT 736*   < > 556* 497* 449* 423*  --  377   < > = values in this interval not displayed.   Cardiac Enzymes: No results for input(s): CKTOTAL, CKMB, CKMBINDEX,  TROPONINI in the last 168 hours. CBG: Recent Labs  Lab 11/02/20 1635 11/02/20 1947 11/03/20 0001 11/03/20 0403 11/03/20 0747  GLUCAP 160* 234* 189* 196* 129*    Iron Studies: No results for input(s): IRON, TIBC, TRANSFERRIN, FERRITIN in the last 72 hours. Studies/Results: CT CHEST ABDOMEN PELVIS W CONTRAST  Result Date: 11/02/2020 CLINICAL DATA:  Sepsis.  Elevated white count. EXAM: CT CHEST, ABDOMEN, AND PELVIS WITH CONTRAST TECHNIQUE: Multidetector CT imaging of the chest, abdomen and pelvis was performed following the standard protocol during bolus administration of intravenous contrast. CONTRAST:  131m OMNIPAQUE IOHEXOL 300 MG/ML  SOLN COMPARISON:  December 29, 2019. FINDINGS: CT CHEST FINDINGS Cardiovascular: Atherosclerosis of thoracic aorta is noted without aneurysm or dissection. Coronary artery calcifications are noted. Normal cardiac size. Minimal pericardial effusion is noted. Mediastinum/Nodes: Thyroid gland is unremarkable. Endotracheal tube is directed slightly into right mainstem bronchus; withdrawal by 2-3 cm is recommended. Nasogastric tube is seen passing through esophagus and into stomach. No adenopathy is noted. Lungs/Pleura: No pneumothorax is noted. Small right pleural effusion is noted with adjacent subsegmental atelectasis of the right lower lobe. Pigtail chest tube is noted in the left hemithorax. Musculoskeletal: No chest wall mass or suspicious bone lesions identified. CT ABDOMEN PELVIS FINDINGS Hepatobiliary: No gallstones or biliary dilatation is noted. Mild gallbladder distention is now noted. The liver is unremarkable. Pancreas: Unremarkable. No pancreatic ductal dilatation or surrounding inflammatory changes. Spleen: Normal in size without focal abnormality. Adrenals/Urinary Tract: Adrenal glands appear normal. Bilateral renal cysts are noted. 3.5 cm heterogeneously enhancing abnormality is noted arising from lower pole of left kidney concerning for possible renal cell  carcinoma. No hydronephrosis or renal obstruction is noted. No renal or ureteral calculi are noted. Urinary bladder is unremarkable. Stomach/Bowel: Nasogastric tube tip is seen in proximal stomach. There is no evidence of bowel obstruction or inflammation. The appendix appears normal. Stool is noted throughout the colon. Vascular/Lymphatic: Aortic atherosclerosis. No enlarged abdominal or pelvic lymph nodes. Reproductive: Uterus and bilateral adnexa are unremarkable. Other: No abdominal wall hernia or abnormality. No abdominopelvic ascites. Musculoskeletal: No acute or significant osseous findings. IMPRESSION: 3.5 cm heterogeneously enhancing abnormality is again noted arising from lower pole of left kidney concerning for possible neoplasm. Further evaluation with MRI with and without gadolinium is recommended. Endotracheal tube is directed slightly into right mainstem bronchus; withdrawal by 2-3  cm is recommended. Small right pleural effusion is noted with adjacent subsegmental atelectasis of right lower lobe. Pigtail catheter is noted in left hemithorax. No pneumothorax is noted. Mild gallbladder distention is now noted. No cholelithiasis or inflammatory changes noted. Aortic Atherosclerosis (ICD10-I70.0). Electronically Signed   By: Marijo Conception M.D.   On: 11/02/2020 16:46   DG Chest Port 1 View  Result Date: 11/02/2020 CLINICAL DATA:  Intubation, altered mental status EXAM: PORTABLE CHEST 1 VIEW COMPARISON:  Portable exam 0528 hours compared to 10/31/2020 FINDINGS: Tip of endotracheal tube projects 2.0 cm above carina. Nasogastric tube extends into stomach. Pigtail LEFT thoracostomy tube present. LEFT jugular dual-lumen central venous catheter with tip projecting over SVC at the level of the azygos confluence. Vascular stents at the RIGHT subclavian/axillary vessels. Normal heart size and mediastinal contours. Atherosclerotic calcification aorta. Mild RIGHT basilar atelectasis. Lungs otherwise clear. No  pleural effusion or pneumothorax. IMPRESSION: Mild RIGHT basilar atelectasis. Electronically Signed   By: Lavonia Dana M.D.   On: 11/02/2020 08:44    Medications: Infusions:   prismasol BGK 4/2.5 400 mL/hr at 11/03/20 0740    prismasol BGK 4/2.5 200 mL/hr at 11/02/20 1856   amiodarone 30 mg/hr (11/03/20 0700)   dextrose     feeding supplement (VITAL 1.5 CAL) 45 mL/hr at 11/02/20 2200   fentaNYL infusion INTRAVENOUS 175 mcg/hr (11/03/20 0700)   heparin 1,050 Units/hr (11/03/20 0700)   insulin Stopped (11/02/20 1646)   meropenem (MERREM) IV Stopped (11/03/20 6010)   norepinephrine (LEVOPHED) Adult infusion Stopped (11/03/20 0208)   phenylephrine (NEO-SYNEPHRINE) Adult infusion 50 mcg/min (11/03/20 0700)   prismasol BGK 4/2.5 1,800 mL/hr at 11/03/20 0624   propofol (DIPRIVAN) infusion Stopped (11/02/20 1515)   vancomycin     vasopressin 0.03 Units/min (11/03/20 0700)    Scheduled Medications:  aspirin  81 mg Per Tube Daily   atorvastatin  40 mg Per Tube Daily   B-complex with vitamin C  1 tablet Per Tube Daily   chlorhexidine gluconate (MEDLINE KIT)  15 mL Mouth Rinse BID   Chlorhexidine Gluconate Cloth  6 each Topical Q0600   clopidogrel  75 mg Per Tube Daily   darbepoetin (ARANESP) injection - DIALYSIS  100 mcg Intravenous Q Tue-HD   docusate  100 mg Per Tube Daily   feeding supplement (PROSource TF)  90 mL Per Tube TID   fentaNYL (SUBLIMAZE) injection  100 mcg Intravenous Once   fentaNYL (SUBLIMAZE) injection  50 mcg Intravenous Once   guaiFENesin  15 mL Per Tube Q6H   insulin aspart  3-9 Units Subcutaneous Q4H   insulin aspart  8 Units Subcutaneous Q4H   insulin glargine  50 Units Subcutaneous Q12H   ipratropium-albuterol  3 mL Nebulization TID   mouth rinse  15 mL Mouth Rinse 10 times per day   pantoprazole (PROTONIX) IV  40 mg Intravenous Q24H   polyethylene glycol  17 g Per Tube Daily   sodium chloride flush  10-40 mL Intracatheter Q12H    have reviewed scheduled and prn  medications.  Physical Exam: General: Critically ill looking female intubated, sedated Heart:RRR, s1s2 nl Lungs: vent, b/l chest expansion Abdomen:soft,  non-distended Extremities: Bilateral BKA, no stump/thigh edema, left hand mottled Dialysis Access: Left IJ temporary HD catheter for CRRT.  AV fistula has good thrill and bruit.  Ruchy Wildrick 11/03/2020,8:04 AM  LOS: 7 days

## 2020-11-03 NOTE — Progress Notes (Signed)
[facility]  Subjective  -remains intubated and critically ill on multiple pressors.   Objective (!) 114/43 80 (!) 97.5 F (36.4 C) (!) 24 96%  Intake/Output Summary (Last 24 hours) at 11/03/2020 1138 Last data filed at 11/03/2020 1100 Gross per 24 hour  Intake 4474.66 ml  Output 2890 ml  Net 1584.66 ml    Left brachial has excellent biphasic/triphasic signal down below the elbow Left radial monophasic at the mid forearm Left ulnar monophasic at the wrist Ischemic tissue changes to the tips of the left thumb and index finger  Laboratory Lab Results: Recent Labs    11/02/20 0341 11/02/20 0741 11/03/20 0401  WBC 29.4*  --  28.2*  HGB 8.9* 10.9* 7.7*  HCT 29.9* 32.0* 26.1*  PLT 423*  --  377   BMET Recent Labs    11/02/20 1638 11/03/20 0401  NA 135 134*  K 4.4 5.0  CL 102 102  CO2 22 23  GLUCOSE 154* 200*  BUN 30* 22  CREATININE 2.30* 1.68*  CALCIUM 8.3* 8.7*    COAG Lab Results  Component Value Date   INR 1.5 (H) 10/28/2020   INR 1.5 (H) 10/27/2020   INR 1.1 09/14/2020   No results found for: PTT  Assessment/Planning:  65 year old female remains critically ill with suspected septic shock in the ICU intubated on multiple pressors.  Vascular surgery was asked to evaluate her left hand yesterday for ischemic changes.  I have reviewed her duplex this morning and she has a left radial occlusion in the distal forearm with calcified diseased vessels.  There was question of filling defect in the brachial artery but there are normal Doppler waveforms distal to the lesion and this does not appear flow-limiting and on exam she has excellent Doppler flow here.  The ulnar is patent to the wrist although diseased.  We will continue to follow through the weekend.  Continue heparin and warming the extremity.  Obviously weaning pressors will help but again understand this is limited with her critical illness.  [name] 11/03/2020 11:38 AM --

## 2020-11-03 NOTE — Progress Notes (Signed)
ANTICOAGULATION CONSULT NOTE  Pharmacy Consult for Heparin  Indication: atrial fibrillation  Allergies  Allergen Reactions   Sulfa Antibiotics Swelling    Patient Measurements: Height: '5\' 4"'$  (162.6 cm) Weight: 86.7 kg (191 lb 2.2 oz) IBW/kg (Calculated) : 54.7   Vital Signs: Temp: 97.5 F (36.4 C) (07/08 1103) BP: 141/46 (07/08 1441) Pulse Rate: 87 (07/08 1615)  Labs: Recent Labs    11/01/20 0308 11/01/20 1600 11/02/20 0341 11/02/20 0741 11/02/20 1638 11/03/20 0401 11/03/20 0516 11/03/20 0750 11/03/20 1325  HGB 8.1*  --  8.9* 10.9*  --  7.7*  --   --   --   HCT 27.8*  --  29.9* 32.0*  --  26.1*  --   --   --   PLT 449*  --  423*  --   --  377  --   --   --   APTT 104*  --   --   --   --   --   --   --   --   HEPARINUNFRC 0.31  --  0.69  --   --  0.80*  --   --  0.56  CREATININE 1.75*   < > 1.65*  --  2.30* 1.68*  --   --   --   TROPONINIHS  --   --   --   --   --   --  119* 115*  --    < > = values in this interval not displayed.     Estimated Creatinine Clearance: 36.5 mL/min (A) (by C-G formula based on SCr of 1.68 mg/dL (H)).   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  She is also noted to be in afib/flutter  > SR on IV amiodarone IV heparin per pharmacy  Heparin level at goal (0.56) on gtt at 1050 units/hr. No bleeding noted per RN. Hgb down to 7.7 (was in the 8s, wondering if 10.9 yesterday was inaccurate).  Goal of Therapy:  Heparin level 0.3-0.7 units/ml Monitor platelets by anticoagulation protocol: Yes   Plan:  Continue IV heparin 1050  units/hr. Daily heparin level and CBC.  [place] Crane, Roylene Reason, BCCP Clinical Pharmacist  11/03/2020 4:20 PM   [facility] pharmacy phone numbers are listed on [email or web address]

## 2020-11-03 NOTE — Progress Notes (Signed)
  Echocardiogram 2D Echocardiogram has been performed.  Christy Zhang 11/03/2020, 10:43 AM

## 2020-11-03 NOTE — Progress Notes (Signed)
Progress Note  Patient Name: Christy Zhang Date of Encounter: 11/03/2020  [facility] Cardiologist: Dr Percival Spanish  Subjective   Remains intubated; eyes open  Inpatient Medications    Scheduled Meds:  aspirin  81 mg Per Tube Daily   atorvastatin  40 mg Per Tube Daily   B-complex with vitamin C  1 tablet Per Tube Daily   chlorhexidine gluconate (MEDLINE KIT)  15 mL Mouth Rinse BID   Chlorhexidine Gluconate Cloth  6 each Topical Q0600   clopidogrel  75 mg Per Tube Daily   darbepoetin (ARANESP) injection - DIALYSIS  100 mcg Intravenous Q Tue-HD   docusate  100 mg Per Tube Daily   feeding supplement (PROSource TF)  90 mL Per Tube TID   fentaNYL (SUBLIMAZE) injection  100 mcg Intravenous Once   fentaNYL (SUBLIMAZE) injection  50 mcg Intravenous Once   guaiFENesin  15 mL Per Tube Q6H   insulin aspart  3-9 Units Subcutaneous Q4H   insulin aspart  6 Units Subcutaneous Q4H   insulin glargine  50 Units Subcutaneous Q12H   ipratropium-albuterol  3 mL Nebulization TID   mouth rinse  15 mL Mouth Rinse 10 times per day   pantoprazole (PROTONIX) IV  40 mg Intravenous Q24H   polyethylene glycol  17 g Per Tube Daily   sodium chloride flush  10-40 mL Intracatheter Q12H   Continuous Infusions:   prismasol BGK 4/2.5 400 mL/hr at 11/03/20 0740    prismasol BGK 4/2.5 200 mL/hr at 11/02/20 1856   amiodarone 30 mg/hr (11/03/20 0800)   dextrose     feeding supplement (VITAL 1.5 CAL) 45 mL/hr at 11/02/20 2200   fentaNYL infusion INTRAVENOUS 175 mcg/hr (11/03/20 0800)   heparin 1,050 Units/hr (11/03/20 0800)   insulin Stopped (11/02/20 1646)   meropenem (MERREM) IV Stopped (11/03/20 5449)   norepinephrine (LEVOPHED) Adult infusion Stopped (11/03/20 0208)   phenylephrine (NEO-SYNEPHRINE) Adult infusion 45 mcg/min (11/03/20 0823)   prismasol BGK 2/2.5 dialysis solution     propofol (DIPRIVAN) infusion Stopped (11/02/20 1515)   vancomycin     vasopressin 0.03 Units/min (11/03/20 0800)    PRN Meds: acetaminophen (TYLENOL) oral liquid 160 mg/5 mL, albuterol, alteplase, dextrose, dextrose, heparin, hydrALAZINE, metoprolol tartrate, midazolam, sodium chloride, sodium chloride flush   Vital Signs    Vitals:   11/03/20 0750 11/03/20 0753 11/03/20 0800 11/03/20 0815  BP: (!) 114/43     Pulse: 89  (!) 118 96  Resp: _0 Temp:      TempSrc:      SpO2: 100% 98% 100% 98%  Weight:      Height:        Intake/Output Summary (Last 24 hours) at 11/03/2020 0824 Last data filed at 11/03/2020 0800 Gross per 24 hour  Intake 4507.56 ml  Output 2508 ml  Net 1999.56 ml    Last 3 Weights 11/03/2020 11/02/2020 11/01/2020  Weight (lbs) 191 lb 2.2 oz 187 lb 13.3 oz 192 lb 14.4 oz  Weight (kg) 86.7 kg 85.2 kg 87.5 kg      Telemetry    Sinus with PAF- Personally Reviewed   Physical Exam   GEN: Intubated WD Neck: No JVD Cardiac: RRR Respiratory: CTA anteriorly GI: Soft, ND, no masses MS: s/p bilateral amputation Neuro:  Intubated; eyes open Psych: Not assessed  Labs    High Sensitivity Troponin:   Recent Labs  Lab 10/27/20 2223 10/28/20 0130 10/28/20 0358 10/28/20 0552 11/03/20 0516  TROPONINIHS 79* 82* 109*  107* 119*       Chemistry Recent Labs  Lab 11/01/20 0308 11/01/20 1600 11/02/20 0341 11/02/20 0741 11/02/20 1638 11/03/20 0401  NA 134*   < > 131* 136 135 134*  K 4.4   < > 5.2* 4.8 4.4 5.0  CL 102   < > 98  --  102 102  CO2 23   < > 22  --  22 23  GLUCOSE 238*   < > 306*  --  154* 200*  BUN 19   < > 21  --  30* 22  CREATININE 1.75*   < > 1.65*  --  2.30* 1.68*  CALCIUM 8.1*   < > 8.8*  --  8.3* 8.7*  PROT 6.5  --  7.7  --   --  7.1  ALBUMIN 2.2*   < > 2.4*  --  2.1* 2.1*  AST 109*  --  74*  --   --  84*  ALT 142*  --  117*  --   --  75*  ALKPHOS 79  --  93  --   --  84  BILITOT 0.8  --  0.8  --   --  1.1  GFRNONAA 32*   < > 35*  --  23* 34*  ANIONGAP 9   < > 11  --  11 9   < > = values in this interval not displayed.       Hematology Recent Labs  Lab 11/01/20 0308 11/02/20 0341 11/02/20 0741 11/03/20 0401  WBC 27.1* 29.4*  --  28.2*  RBC 3.37* 3.64*  --  3.22*  HGB 8.1* 8.9* 10.9* 7.7*  HCT 27.8* 29.9* 32.0* 26.1*  MCV 82.5 82.1  --  81.1  MCH 24.0* 24.5*  --  23.9*  MCHC 29.1* 29.8*  --  29.5*  RDW 19.7* 20.4*  --  20.9*  PLT 449* 423*  --  377      Radiology    CT CHEST ABDOMEN PELVIS W CONTRAST  Result Date: 11/02/2020 CLINICAL DATA:  Sepsis.  Elevated white count. EXAM: CT CHEST, ABDOMEN, AND PELVIS WITH CONTRAST TECHNIQUE: Multidetector CT imaging of the chest, abdomen and pelvis was performed following the standard protocol during bolus administration of intravenous contrast. CONTRAST:  148m OMNIPAQUE IOHEXOL 300 MG/ML  SOLN COMPARISON:  December 29, 2019. FINDINGS: CT CHEST FINDINGS Cardiovascular: Atherosclerosis of thoracic aorta is noted without aneurysm or dissection. Coronary artery calcifications are noted. Normal cardiac size. Minimal pericardial effusion is noted. Mediastinum/Nodes: Thyroid gland is unremarkable. Endotracheal tube is directed slightly into right mainstem bronchus; withdrawal by 2-3 cm is recommended. Nasogastric tube is seen passing through esophagus and into stomach. No adenopathy is noted. Lungs/Pleura: No pneumothorax is noted. Small right pleural effusion is noted with adjacent subsegmental atelectasis of the right lower lobe. Pigtail chest tube is noted in the left hemithorax. Musculoskeletal: No chest wall mass or suspicious bone lesions identified. CT ABDOMEN PELVIS FINDINGS Hepatobiliary: No gallstones or biliary dilatation is noted. Mild gallbladder distention is now noted. The liver is unremarkable. Pancreas: Unremarkable. No pancreatic ductal dilatation or surrounding inflammatory changes. Spleen: Normal in size without focal abnormality. Adrenals/Urinary Tract: Adrenal glands appear normal. Bilateral renal cysts are noted. 3.5 cm heterogeneously enhancing  abnormality is noted arising from lower pole of left kidney concerning for possible renal cell carcinoma. No hydronephrosis or renal obstruction is noted. No renal or ureteral calculi are noted. Urinary bladder is unremarkable. Stomach/Bowel: Nasogastric tube tip is seen in  proximal stomach. There is no evidence of bowel obstruction or inflammation. The appendix appears normal. Stool is noted throughout the colon. Vascular/Lymphatic: Aortic atherosclerosis. No enlarged abdominal or pelvic lymph nodes. Reproductive: Uterus and bilateral adnexa are unremarkable. Other: No abdominal wall hernia or abnormality. No abdominopelvic ascites. Musculoskeletal: No acute or significant osseous findings. IMPRESSION: 3.5 cm heterogeneously enhancing abnormality is again noted arising from lower pole of left kidney concerning for possible neoplasm. Further evaluation with MRI with and without gadolinium is recommended. Endotracheal tube is directed slightly into right mainstem bronchus; withdrawal by 2-3 cm is recommended. Small right pleural effusion is noted with adjacent subsegmental atelectasis of right lower lobe. Pigtail catheter is noted in left hemithorax. No pneumothorax is noted. Mild gallbladder distention is now noted. No cholelithiasis or inflammatory changes noted. Aortic Atherosclerosis (ICD10-I70.0). Electronically Signed   By: Marijo Conception M.D.   On: 11/02/2020 16:46   DG Chest Port 1 View  Result Date: 11/02/2020 CLINICAL DATA:  Intubation, altered mental status EXAM: PORTABLE CHEST 1 VIEW COMPARISON:  Portable exam 0528 hours compared to 10/31/2020 FINDINGS: Tip of endotracheal tube projects 2.0 cm above carina. Nasogastric tube extends into stomach. Pigtail LEFT thoracostomy tube present. LEFT jugular dual-lumen central venous catheter with tip projecting over SVC at the level of the azygos confluence. Vascular stents at the RIGHT subclavian/axillary vessels. Normal heart size and mediastinal contours.  Atherosclerotic calcification aorta. Mild RIGHT basilar atelectasis. Lungs otherwise clear. No pleural effusion or pneumothorax. IMPRESSION: Mild RIGHT basilar atelectasis. Electronically Signed   By: Lavonia Dana M.D.   On: 11/02/2020 08:44     Patient Profile     65 y.o. female admitted with pneumonia and respiratory failure being evaluated for atrial fibrillation.  Echocardiogram showed normal LV function, moderate left ventricular hypertrophy, moderate pulmonary hypertension, small pericardial effusion.  Assessment & Plan    1 paroxysmal atrial fibrillation-patient in and out of atrial fibrillation.  Continue IV amiodarone and IV heparin.  Will need long-term anticoagulation if she recovers from present illness.  2 ECG changes-patient had electrocardiogram performed earlier as there was question ST elevation on telemetry.  I have personally reviewed and this shows a 1 mm of inferior lateral ST elevation concerning for ischemia.  Given her overall medical condition at this point I do not think she is a good candidate for catheterization.  We will cycle enzymes.  Continue aspirin, Plavix, statin and heparin.  No beta-blockade as she is on vasopressin and norepinephrine for septic shock.  3 respiratory failure-felt secondary to pneumonia and acute on chronic diastolic congestive heart failure.  Vent management per critical care medicine.    4 septic shock-antibiotics are being broadened by critical care medicine.  5 end-stage renal disease-nephrology managing.    For questions or updates, please contact [place] Please consult [email or web address] for contact info under        Signed, Kirk Ruths, MD  11/03/2020, 8:24 AM

## 2020-11-03 NOTE — Progress Notes (Signed)
Nutrition Follow Up  DOCUMENTATION CODES:   Not applicable  INTERVENTION:   If GOC remains full scope recommend Cortrak placement  Day 6 without BM- recommend addition of suppository if no result in next 24-48 hours  Tube feeding:  -Vital 1.5 @ 55 ml/hr via OG (1320 ml) -ProSource TF 45 ml QID  Provides: 2220 kcals, 133 grams protein, 1008 ml free water.   Continue B complex with Vitamin C to account for losses with CRRT  NUTRITION DIAGNOSIS:   Increased nutrient needs related to acute illness as evidenced by estimated needs.  Ongoing  GOAL:   Patient will meet greater than or equal to 90% of their needs  Met via TF  MONITOR:   Vent status, Skin, TF tolerance, Weight trends, Labs, I & O's  REASON FOR ASSESSMENT:   Consult, Ventilator Enteral/tube feeding initiation and management  ASSESSMENT:   Patient with PMH significant for HTN, HLD, PVD s/p bilateral BKA, DM, ESRD on HD, and CHF. Presents this admission with volume overload and PNA with pleural effusions.  7/03- brief PEA arrest 7/05- L chest tube placed   Remains on pressors. Even on CRRT. Off inuslin drip. CT abdomen negative for source of infection. Left hand with new vascular changes-undergoing L upper extremity arterial duplex. Is day 6 without BM- regimen in place. Tolerating tube feeding, increase now that patient is not requiring propofol.   Patient remains intubated on ventilator support MV: 10.5 L/min Temp (24hrs), Avg:97.3 F (36.3 C), Min:97 F (36.1 C), Max:97.5 F (36.4 C)   EDW: 97 kg  Current weight: 86.7 kg   CRRT: 2507 ml x 24 hrs   Drips: neosynephrine, vasopressin  Medications: aranesp, colace, SS novolog, lantus, miralax Labs: Na 134 (L) Mg 2.5 (H) CBG 189-234  Diet Order:   Diet Order             Diet NPO time specified  Diet effective now                   EDUCATION NEEDS:   Not appropriate for education at this time  Skin:  Skin Assessment: Skin Integrity  Issues: Skin Integrity Issues:: Other (Comment), Incisions, Stage II Stage II: coccyx Incisions: R BKA nonhealing Other: MASD- R groin  Last BM:  7/2  Height:   Ht Readings from Last 1 Encounters:  10/29/20 5' 4"  (1.626 m)    Weight:   Wt Readings from Last 1 Encounters:  11/03/20 86.7 kg    BMI:  Body mass index is 32.81 kg/m.  Estimated Nutritional Needs:   Kcal:  2100-2300 kcal  Protein:  130-150 grams  Fluid:  1000 ml + UOP (liberalize with CRRT)  Regino Fournet MS, RD, LDN, CNSC Clinical Nutrition Pager listed in [place]

## 2020-11-03 NOTE — Progress Notes (Addendum)
NAME:  Christy Zhang, MRN:  [id number], DOB:  [birth year], LOS: 7 ADMISSION DATE:  10/27/2020, CONSULTATION DATE:  7/3 REFERRING MD:  Tyrell Antonio, CHIEF COMPLAINT:  dyspnea   Brief History of Present Illness:  65 y/o female admitted for presumed pneumonia and hypotension, required intubation, mechanical ventilation and CRRT in the setting of septic shock from pneumonia cauting acute respiratory failure with hypoxemia and AKI.  Pertinent  Medical History  CHF CKD T2DM ESRD HLD HTN  Significant Hospital Events: Including procedures, antibiotic start and stop dates in addition to other pertinent events   Admitted to hospital on 7/1 7/1 cefepime >  7/1 flagyl x1 7/1 vancomycin>  Admitted to ICU on 10/29/20 for CRRT 7/3 - brief PEA with bearing down while agitated. 7/3 ETT 7/3 left radial arterial line 7/3 L IJ HD cath 7/5 L pigtail CT placed for L pleural effusion 7/6 CT output -550 cc 7/8 A fib, ST elevation noted per EKG, Concern for ischemia, cards consulted  7/8>> [place] LUE >Occluded radial artery from the mid to distal segment. Ulnar artery patent  with calcification through the mid to distal segments. Appearance of a  non-obstructive lesion in the distal brachial artery.  7/7 >> CT Chest Abdomen  IMPRESSION: 3.5 cm heterogeneously enhancing abnormality is again noted arising from lower pole of left kidney concerning for possible neoplasm. Further evaluation with MRI with and without gadolinium is recommended.   Endotracheal tube is directed slightly into right mainstem bronchus; withdrawal by 2-3 cm is recommended.   Small right pleural effusion is noted with adjacent subsegmental atelectasis of right lower lobe.   Pigtail catheter is noted in left hemithorax. No pneumothorax is noted.   Mild gallbladder distention is now noted. No cholelithiasis or inflammatory changes noted.   Interim History / Subjective:   Glucose range 129-189 on basal insulin , insulin gtt is  off T max 100.4; WBC  28.2 (29.4 ), platelets 377 , HGB 7.7 Levo weaned overnight and transitioned to Vaso and Neo to see if improvement in a fib  Mag 2.5, K 5.0, creatinine 1.68 ( 2.3) Troponin 119/115 Net negative 5L , on CVVH  Sedated on and fent 150, Propofol is off On heparin and amio;  runs of atrial fib , ST elevation 1 mm of inferior lateral ST elevation concerning for ischemia>> Per cards not candidate for intervention, Remains intubated on mech vent;  Chest tube discontinued 7/7>> CXR with No consolidation, effusion or pneumothorax.  Left hand is mottled, thumb and pinky look dusky, Evaluated by Vascular, no intervention possible at this time.  Weaning on 40%, 10/5 for a few hours , now on full support ( STEMI)    Objective   Blood pressure (!) 114/43, pulse 81, temperature (!) 97.3 F (36.3 C), resp. rate 11, height '5\' 4"'$  (1.626 m), weight 86.7 kg, SpO2 100 %.    Vent Mode: PSV;CPAP FiO2 (%):  [40 %] 40 % Set Rate:  [24 bmp] 24 bmp Vt Set:  [440 mL] 440 mL PEEP:  [5 cmH20] 5 cmH20 Pressure Support:  [12 cmH20-14 cmH20] 12 cmH20 Plateau Pressure:  [19 cmH20-22 cmH20] 19 cmH20   Intake/Output Summary (Last 24 hours) at 11/03/2020 0918 Last data filed at 11/03/2020 0900 Gross per 24 hour  Intake 4572.9 ml  Output 2546 ml  Net 2026.9 ml   Filed Weights   11/01/20 0500 11/02/20 0500 11/03/20 0500  Weight: 87.5 kg 85.2 kg 86.7 kg    Examination: General:  critcally ill apearing  on mech vent, weaning CPAP/PS , sedated HEENT: MM pink/moist; ETT in place, HD cath per [facility] Neuro: sedated, per nursing follows some simple commands CV: Runs of A fib, IRR, S1, S2,  PULM:  Bilateral chest excursion , clear upper lobes, diminished per bases, Left CT removed  GI: soft, bsx4 active , No BM several days Extremities: warm/dry, bilateral BKA, Left hand with dark demarkation thumb. Index finger and Pinky, mottled, cool to touch, no cap refill.  Skin: no rashes or lesions,   7/1 blood  culture > negative 7/1 urine culture > multiple species 7/2 SARS cov 2/flu > neg 7/7 trach culture >  CXR:  personally reviewed: left  CT removed,  resolved  L pleural effusion; ETT in place, HD cath in place  Resolved Hospital Problem list     Assessment & Plan:  Acute respiratory failure with hypercarbia and hypoxemia due to severe community acquired pneumonia Full mechanical vent support L pleural effusion: Lights criteria 98% positive; pleural cultures sent 7/6 P: - continue mech vent support: PRVC - saturation goals are > 94% -daily SBTs/SAT once cleared by cards -VAP prevention -follow pleural cultures -follow trach cultures -continue cefepime -CXR am  Septic shock due to CAP P: - wean pressors for MAP >65 - CT Abdomen, no obvious source of infection -continue cefepime; consider broadening abx and repeat Bcx2 pending CT abdomen results -trend fever/wbc curve -follow cultures; will resend trach aspirate -Vascular evaluated BKA wound and believes likely not the source of infection  Need for sedation for mechanical ventilation P: -continue sedation for RASS goal -2 -PAD protocol  Constipation P: - continue bowel regimen today -Will add Dulcolax suppository daily prn  ESRD K of 5 on BMET 7/8 P: -Nephro following -continue CRRT w/ volume removal -trend bmp and uop - Decrease K bath to 2.5 from 4  PAD/ PVD Occlusion of radial artery, ulnar artery with calcifications, distal brachial with non- obstructive lesion Per vascular surgery, no intervention due to critical illness at present time  P: -continue statin and plavix - Continue heparin gtt - Wean pressors  - Appreciate vascular surgery assist - Mark line of demarkation to better follow extension of ischemia - No sticks L arm, no lines L arm  Atrial filbrillation New STEMI 7/8 >> not candidate for cath.  P: - continuous telemetry - continue IV heparin and IV amio  - Transition from vaso and levo to  vaso and neo - Repeat echo  - Cycle enzymes - Continue aspirin, Plavix, statin and heparin - No beta-blockade as she is on vasopressin and norepinephrine - EKG prn - Maintain mag > 2, K > 4  DM2 with hyperglycemia Insulin gtt off Blood sugars have dropped into 120 range P: -increasing basal insulin at 50; insulin gtt is off -CBG monitoring -continue tube feeds  Will new NSTEMI , and vascular changes to L arm/ hand/ will consult Palliative care to start discussions with family. They are currently very hopeful.    Best Practice (right click and "Reselect all SmartList Selections" daily)   Diet/type: tubefeeds DVT prophylaxis: systemic heparin GI prophylaxis: PPI Lines: yes and it is still needed Foley:  N/A Code Status:  full code Last date of multidisciplinary goals of care discussion [7/6 updated daughter by phone]  Labs   CBC: Recent Labs  Lab 10/27/20 1851 10/28/20 0358 10/30/20 0422 10/31/20 0323 11/01/20 0308 11/02/20 0341 11/02/20 0741 11/03/20 0401  WBC 18.7*   < > 27.4* 26.4* 27.1* 29.4*  --  28.2*  NEUTROABS 15.7*  --   --   --  22.8*  --   --   --   HGB 9.5*   < > 8.6* 8.4* 8.1* 8.9* 10.9* 7.7*  HCT 32.0*   < > 28.7* 28.2* 27.8* 29.9* 32.0* 26.1*  MCV 81.8   < > 81.8 82.5 82.5 82.1  --  81.1  PLT 736*   < > 556* 497* 449* 423*  --  377   < > = values in this interval not displayed.    Basic Metabolic Panel: Recent Labs  Lab 10/30/20 1610 10/31/20 0323 10/31/20 1600 11/01/20 0308 11/01/20 1600 11/02/20 0341 11/02/20 0741 11/02/20 1638 11/03/20 0401  NA  --  134*   < > 134* 134* 131* 136 135 134*  K  --  4.6   < > 4.4 4.4 5.2* 4.8 4.4 5.0  CL  --  101   < > 102 100 98  --  102 102  CO2  --  22   < > '23 24 22  '$ --  22 23  GLUCOSE  --  262*   < > 238* 118* 306*  --  154* 200*  BUN  --  16   < > '19 15 21  '$ --  30* 22  CREATININE  --  2.13*   < > 1.75* 1.49* 1.65*  --  2.30* 1.68*  CALCIUM  --  8.2*   < > 8.1* 8.7* 8.8*  --  8.3* 8.7*  MG 2.3 2.4   --  2.3  --  2.7*  --   --  2.5*  PHOS  --  2.7   < > 2.6 2.3* 3.6  --  5.6* 3.3   < > = values in this interval not displayed.   GFR: Estimated Creatinine Clearance: 36.5 mL/min (A) (by C-G formula based on SCr of 1.68 mg/dL (H)). Recent Labs  Lab 10/27/20 1927 10/27/20 2150 10/28/20 0133 10/28/20 0358 10/29/20 1629 10/31/20 0323 11/01/20 0308 11/02/20 0341 11/03/20 0401  PROCALCITON  --   --   --  4.20  --   --   --   --   --   WBC  --   --   --  17.5*   < > 26.4* 27.1* 29.4* 28.2*  LATICACIDVEN 2.9* 2.0* 2.0* 1.3  --   --   --   --   --    < > = values in this interval not displayed.    Liver Function Tests: Recent Labs  Lab 10/27/20 1851 10/28/20 0358 10/30/20 0422 10/31/20 2220 11/01/20 0308 11/01/20 1600 11/02/20 0341 11/02/20 1638 11/03/20 0401  AST 30 37  --   --  109*  --  74*  --  84*  ALT 18 23  --   --  142*  --  117*  --  75*  ALKPHOS 86 63  --   --  79  --  93  --  84  BILITOT 0.5 0.5  --   --  0.8  --  0.8  --  1.1  PROT 7.6 6.7  --  6.5 6.5  --  7.7  --  7.1  ALBUMIN 2.8* 2.5*   < >  --  2.2* 2.3* 2.4* 2.1* 2.1*   < > = values in this interval not displayed.   No results for input(s): LIPASE, AMYLASE in the last 168 hours. No results for input(s): AMMONIA in the last 168 hours.  ABG  Component Value Date/Time   PHART 7.401 11/02/2020 0741   PCO2ART 41.2 11/02/2020 0741   PO2ART 135 (H) 11/02/2020 0741   HCO3 25.8 11/02/2020 0741   TCO2 27 11/02/2020 0741   ACIDBASEDEF 4.0 (H) 10/29/2020 1630   O2SAT 99.0 11/02/2020 0741      Coagulation Profile: Recent Labs  Lab 10/27/20 1851 10/28/20 0358  INR 1.5* 1.5*    Cardiac Enzymes: No results for input(s): CKTOTAL, CKMB, CKMBINDEX, TROPONINI in the last 168 hours.  HbA1C: HB A1C (BAYER DCA - WAIVED)  Date/Time Value Ref Range Status  04/06/2020 11:05 AM 5.4 <7.0 % Final    Comment:                                          Diabetic Adult            <7.0                                        Healthy Adult        4.3 - 5.7                                                           (DCCT/NGSP) American Diabetes Association's Summary of Glycemic Recommendations for Adults with Diabetes: Hemoglobin A1c <7.0%. More stringent glycemic goals (A1c <6.0%) may further reduce complications at the cost of increased risk of hypoglycemia.   01/06/2020 10:13 AM 6.2 <7.0 % Final    Comment:                                          Diabetic Adult            <7.0                                       Healthy Adult        4.3 - 5.7                                                           (DCCT/NGSP) American Diabetes Association's Summary of Glycemic Recommendations for Adults with Diabetes: Hemoglobin A1c <7.0%. More stringent glycemic goals (A1c <6.0%) may further reduce complications at the cost of increased risk of hypoglycemia.    Hgb A1c MFr Bld  Date/Time Value Ref Range Status  10/27/2020 06:48 PM 6.4 (H) 4.8 - 5.6 % Final    Comment:    (NOTE)         Prediabetes: 5.7 - 6.4         Diabetes: >6.4         Glycemic control for adults with diabetes: <7.0   08/09/2020 05:23 AM 6.5 (H) 4.8 - 5.6 % Final  Comment:    (NOTE) Pre diabetes:          5.7%-6.4%  Diabetes:              >6.4%  Glycemic control for   <7.0% adults with diabetes     CBG: Recent Labs  Lab 11/02/20 1635 11/02/20 1947 11/03/20 0001 11/03/20 0403 11/03/20 0747  GLUCAP 160* 234* 189* 196* 129*    Critical care time: 40 minutes    Magdalen Spatz, MSN, AGACNP-BC [place] for personal pager PCCM on call pager [phone] >>Hospital Use Only After 7:00 pm call Elink  [facility] Use only 11/03/2020, 9:18 AM

## 2020-11-03 NOTE — Progress Notes (Signed)
eLink Physician-Brief Progress Note Patient Name: Christy Zhang DOB: 1955/06/19 MRN: [id number]   Date of Service  11/03/2020  HPI/Events of Note  Patient is in septic shock on Levo and vasopressin. She is on CRRT with goal net even fluid balance.  She has had several episodes this evening of transient rapid Afib. Some of these episodes have been poorly tolerated from a blood pressure standpoint.  She is already on an amiodarone infusion. RN asks whether it would be reasonable to try Neo + Vaso (instead of levo) to try to reduce arrhythmogenicity.   eICU Interventions  Reasonable to try this approach. Ordered Neo infusion. RN to cross-titrate from levophed to phenylephrine and observe for improvement in arrhythmogenicity.     Intervention Category Intermediate Interventions: Arrhythmia - evaluation and management  Marily Lente Joaopedro Eschbach 11/03/2020, 12:07 AM

## 2020-11-03 NOTE — Progress Notes (Addendum)
[place] Progress Note Patient Name: Christy Zhang DOB: [birth year] MRN: [id number]   Date of Service  11/03/2020  HPI/Events of Note  RN calls about EKG which shows borderline STE in inferior leads and TWIs in V4-V6.  Patient is in septic shock on vasopressin + phenylephrine drips. She is already on DAPT with ASA/Plavix, as well as a heparin drip with therapeutic levels.   eICU Interventions  Possible inferior STEMI. Discussed case with Dr. Erskine Emery. Unfortunately, even if this is a STEMI, the patient is not a candidate for intervention due to her active septic shock.  Ordered ASA '325mg'$  via tube. Already on DAPT at baseline.  Continue heparin drip.  Repeat EKG in 15 mins.  Ordered serial troponins and a TTE to be done ASAP.   ADDENDUM: - Repeat EKG same as prior. - First troponin 119 (was similar at 107 on 7/2). - F/u Echo and next troponin.  Intervention Category Major Interventions: Other:  [name] 11/03/2020, 5:04 AM

## 2020-11-04 ENCOUNTER — Inpatient Hospital Stay (HOSPITAL_COMMUNITY): Payer: Medicare Other | Admitting: Certified Registered Nurse Anesthetist

## 2020-11-04 ENCOUNTER — Encounter (HOSPITAL_COMMUNITY)
Admission: EM | Disposition: A | Discharge status: Home Health | Payer: Self-pay | Source: Home / Self Care | Attending: Internal Medicine

## 2020-11-04 ENCOUNTER — Inpatient Hospital Stay (HOSPITAL_COMMUNITY): Payer: Medicare Other

## 2020-11-04 DIAGNOSIS — I742 Embolism and thrombosis of arteries of the upper extremities: Secondary | ICD-10-CM

## 2020-11-04 DIAGNOSIS — J189 Pneumonia, unspecified organism: Secondary | ICD-10-CM | POA: Diagnosis not present

## 2020-11-04 DIAGNOSIS — I739 Peripheral vascular disease, unspecified: Secondary | ICD-10-CM | POA: Diagnosis not present

## 2020-11-04 DIAGNOSIS — I480 Paroxysmal atrial fibrillation: Secondary | ICD-10-CM | POA: Diagnosis not present

## 2020-11-04 DIAGNOSIS — I469 Cardiac arrest, cause unspecified: Secondary | ICD-10-CM | POA: Diagnosis not present

## 2020-11-04 DIAGNOSIS — N186 End stage renal disease: Secondary | ICD-10-CM | POA: Diagnosis not present

## 2020-11-04 DIAGNOSIS — R579 Shock, unspecified: Secondary | ICD-10-CM

## 2020-11-04 DIAGNOSIS — Z515 Encounter for palliative care: Secondary | ICD-10-CM

## 2020-11-04 DIAGNOSIS — J9601 Acute respiratory failure with hypoxia: Secondary | ICD-10-CM | POA: Diagnosis not present

## 2020-11-04 DIAGNOSIS — Z7189 Other specified counseling: Secondary | ICD-10-CM

## 2020-11-04 HISTORY — PX: THROMBECTOMY BRACHIAL ARTERY: SHX6649

## 2020-11-04 LAB — RENAL FUNCTION PANEL
Albumin: 2.1 g/dL — ABNORMAL LOW (ref 3.5–5.0)
Albumin: 2.3 g/dL — ABNORMAL LOW (ref 3.5–5.0)
Anion gap: 9 (ref 5–15)
Anion gap: 11 (ref 5–15)
BUN: 19 mg/dL (ref 8–23)
BUN: 23 mg/dL (ref 8–23)
CO2: 25 mmol/L (ref 22–32)
CO2: 23 mmol/L (ref 22–32)
Calcium: 8.4 mg/dL — ABNORMAL LOW (ref 8.9–10.3)
Calcium: 9 mg/dL (ref 8.9–10.3)
Chloride: 100 mmol/L (ref 98–111)
Chloride: 101 mmol/L (ref 98–111)
Creatinine, Ser: 1.49 mg/dL — ABNORMAL HIGH (ref 0.44–1.00)
Creatinine, Ser: 1.68 mg/dL — ABNORMAL HIGH (ref 0.44–1.00)
GFR, Estimated: 39 mL/min — ABNORMAL LOW (ref >60)
GFR, Estimated: 34 mL/min — ABNORMAL LOW (ref >60)
Glucose, Bld: 175 mg/dL — ABNORMAL HIGH (ref 70–99)
Glucose, Bld: 209 mg/dL — ABNORMAL HIGH (ref 70–99)
Phosphorus: 3.3 mg/dL (ref 2.5–4.6)
Phosphorus: 4.6 mg/dL (ref 2.5–4.6)
Potassium: 4.1 mmol/L (ref 3.5–5.1)
Potassium: 3.8 mmol/L (ref 3.5–5.1)
Sodium: 134 mmol/L — ABNORMAL LOW (ref 135–145)
Sodium: 135 mmol/L (ref 135–145)

## 2020-11-04 LAB — BODY FLUID CULTURE W GRAM STAIN
Culture: NO GROWTH
Gram Stain: NONE SEEN

## 2020-11-04 LAB — CBC
HCT: 25.4 % — ABNORMAL LOW (ref 36.0–46.0)
Hemoglobin: 7.6 g/dL — ABNORMAL LOW (ref 12.0–15.0)
MCH: 24.5 pg — ABNORMAL LOW (ref 26.0–34.0)
MCHC: 29.9 g/dL — ABNORMAL LOW (ref 30.0–36.0)
MCV: 81.9 fL (ref 80.0–100.0)
Platelets: 375 10*3/uL (ref 150–400)
RBC: 3.1 MIL/uL — ABNORMAL LOW (ref 3.87–5.11)
RDW: 21.4 % — ABNORMAL HIGH (ref 11.5–15.5)
WBC: 23.8 10*3/uL — ABNORMAL HIGH (ref 4.0–10.5)
nRBC: 0.5 % — ABNORMAL HIGH (ref 0.0–0.2)

## 2020-11-04 LAB — MAGNESIUM: Magnesium: 2.6 mg/dL — ABNORMAL HIGH (ref 1.7–2.4)

## 2020-11-04 LAB — BRAIN NATRIURETIC PEPTIDE: B Natriuretic Peptide: 230.6 pg/mL — ABNORMAL HIGH (ref 0.0–100.0)

## 2020-11-04 LAB — GLUCOSE, CAPILLARY
Glucose-Capillary: 117 mg/dL — ABNORMAL HIGH (ref 70–99)
Glucose-Capillary: 140 mg/dL — ABNORMAL HIGH (ref 70–99)
Glucose-Capillary: 173 mg/dL — ABNORMAL HIGH (ref 70–99)
Glucose-Capillary: 176 mg/dL — ABNORMAL HIGH (ref 70–99)
Glucose-Capillary: 214 mg/dL — ABNORMAL HIGH (ref 70–99)
Glucose-Capillary: 217 mg/dL — ABNORMAL HIGH (ref 70–99)
Glucose-Capillary: 228 mg/dL — ABNORMAL HIGH (ref 70–99)
Glucose-Capillary: 237 mg/dL — ABNORMAL HIGH (ref 70–99)

## 2020-11-04 LAB — PREPARE RBC (CROSSMATCH)

## 2020-11-04 LAB — CULTURE, RESPIRATORY W GRAM STAIN: Culture: NORMAL

## 2020-11-04 LAB — HEPARIN LEVEL (UNFRACTIONATED): Heparin Unfractionated: 0.42 IU/mL (ref 0.30–0.70)

## 2020-11-04 LAB — POCT ACTIVATED CLOTTING TIME: Activated Clotting Time: 161 seconds

## 2020-11-04 LAB — TROPONIN I (HIGH SENSITIVITY): Troponin I (High Sensitivity): 91 ng/L — ABNORMAL HIGH (ref <18)

## 2020-11-04 SURGERY — THROMBECTOMY, ARTERY, BRACHIAL
Anesthesia: General | Site: Arm Lower | Laterality: Left

## 2020-11-04 MED ORDER — 0.9 % SODIUM CHLORIDE (POUR BTL) OPTIME
TOPICAL | Status: DC | PRN
  Administered 2020-11-04: 1000 mL

## 2020-11-04 MED ORDER — HEMOSTATIC AGENTS (NO CHARGE) OPTIME
TOPICAL | Status: DC | PRN
  Administered 2020-11-04: 1 via TOPICAL

## 2020-11-04 MED ORDER — CALCIUM CHLORIDE 10 % IV SOLN
INTRAVENOUS | Status: DC | PRN
  Administered 2020-11-04: 200 meq via INTRAVENOUS
  Administered 2020-11-04: 300 meq via INTRAVENOUS

## 2020-11-04 MED ORDER — HEPARIN 6000 UNIT IRRIGATION SOLUTION
Status: AC
  Filled 2020-11-04: qty 500

## 2020-11-04 MED ORDER — ONDANSETRON HCL 4 MG/2ML IJ SOLN
INTRAMUSCULAR | Status: AC
  Filled 2020-11-04: qty 2

## 2020-11-04 MED ORDER — PROPOFOL 10 MG/ML IV BOLUS
INTRAVENOUS | Status: DC | PRN
  Administered 2020-11-04: 20 mg via INTRAVENOUS

## 2020-11-04 MED ORDER — LIDOCAINE-EPINEPHRINE 1 %-1:100000 IJ SOLN
INTRAMUSCULAR | Status: AC
  Filled 2020-11-04: qty 1

## 2020-11-04 MED ORDER — FENTANYL CITRATE (PF) 250 MCG/5ML IJ SOLN
INTRAMUSCULAR | Status: DC | PRN
  Administered 2020-11-04: 50 ug via INTRAVENOUS

## 2020-11-04 MED ORDER — ROCURONIUM BROMIDE 10 MG/ML (PF) SYRINGE
PREFILLED_SYRINGE | INTRAVENOUS | Status: AC
  Filled 2020-11-04: qty 10

## 2020-11-04 MED ORDER — AMIODARONE HCL 200 MG PO TABS
400.0000 mg | ORAL_TABLET | Freq: Two times a day (BID) | ORAL | Status: DC
  Administered 2020-11-04 – 2020-11-05 (×2): 400 mg via ORAL
  Filled 2020-11-04 (×2): qty 2

## 2020-11-04 MED ORDER — CHLORHEXIDINE GLUCONATE 0.12 % MT SOLN
OROMUCOSAL | Status: AC
  Administered 2020-11-04: 15 mL via OROMUCOSAL
  Filled 2020-11-04: qty 15

## 2020-11-04 MED ORDER — HEPARIN 6000 UNIT IRRIGATION SOLUTION
Status: DC | PRN
  Administered 2020-11-04: 1

## 2020-11-04 MED ORDER — NOREPINEPHRINE 4 MG/250ML-% IV SOLN
INTRAVENOUS | Status: DC | PRN
  Administered 2020-11-04: 2 ug/min via INTRAVENOUS

## 2020-11-04 MED ORDER — PHENYLEPHRINE 40 MCG/ML (10ML) SYRINGE FOR IV PUSH (FOR BLOOD PRESSURE SUPPORT)
PREFILLED_SYRINGE | INTRAVENOUS | Status: AC
  Filled 2020-11-04: qty 10

## 2020-11-04 MED ORDER — DEXTROSE 5 % IV SOLN: INTRAVENOUS | Status: DC

## 2020-11-04 MED ORDER — SODIUM CHLORIDE 0.9% IV SOLUTION: Freq: Once | INTRAVENOUS | Status: DC

## 2020-11-04 MED ORDER — MIDAZOLAM HCL 2 MG/2ML IJ SOLN
INTRAMUSCULAR | Status: DC | PRN
  Administered 2020-11-04: 2 mg via INTRAVENOUS

## 2020-11-04 MED ORDER — LACTATED RINGERS IV SOLN: INTRAVENOUS | Status: DC | PRN

## 2020-11-04 MED ORDER — FENTANYL CITRATE (PF) 250 MCG/5ML IJ SOLN
INTRAMUSCULAR | Status: AC
  Filled 2020-11-04: qty 5

## 2020-11-04 MED ORDER — PROPOFOL 10 MG/ML IV BOLUS
INTRAVENOUS | Status: AC
  Filled 2020-11-04: qty 20

## 2020-11-04 MED ORDER — HEPARIN SODIUM (PORCINE) 1000 UNIT/ML IJ SOLN
INTRAMUSCULAR | Status: DC | PRN
  Administered 2020-11-04: 7000 [IU] via INTRAVENOUS

## 2020-11-04 MED ORDER — INSULIN GLARGINE 100 UNIT/ML ~~LOC~~ SOLN
55.0000 [IU] | Freq: Two times a day (BID) | SUBCUTANEOUS | Status: DC
  Administered 2020-11-04 – 2020-11-05 (×3): 55 [IU] via SUBCUTANEOUS
  Filled 2020-11-04 (×6): qty 0.55

## 2020-11-04 MED ORDER — DEXAMETHASONE SODIUM PHOSPHATE 10 MG/ML IJ SOLN
INTRAMUSCULAR | Status: AC
  Filled 2020-11-04: qty 1

## 2020-11-04 MED ORDER — ALBUMIN HUMAN 5 % IV SOLN: INTRAVENOUS | Status: DC | PRN

## 2020-11-04 MED ORDER — MIDAZOLAM HCL 2 MG/2ML IJ SOLN
INTRAMUSCULAR | Status: AC
  Filled 2020-11-04: qty 2

## 2020-11-04 MED ORDER — LIDOCAINE 2% (20 MG/ML) 5 ML SYRINGE
INTRAMUSCULAR | Status: AC
  Filled 2020-11-04: qty 5

## 2020-11-04 MED ORDER — ROCURONIUM BROMIDE 10 MG/ML (PF) SYRINGE
PREFILLED_SYRINGE | INTRAVENOUS | Status: DC | PRN
  Administered 2020-11-04 (×2): 50 mg via INTRAVENOUS

## 2020-11-04 SURGICAL SUPPLY — 44 items
ADH SKN CLS APL DERMABOND .7 (GAUZE/BANDAGES/DRESSINGS) ×1
ARMBAND PINK RESTRICT EXTREMIT (MISCELLANEOUS) ×2 IMPLANT
BAG COUNTER SPONGE SURGICOUNT (BAG) ×2 IMPLANT
BAG SPNG CNTER NS LX DISP (BAG) ×1
CANISTER SUCT 3000ML PPV (MISCELLANEOUS) ×2 IMPLANT
CATH EMB 3FR 80CM (CATHETERS) IMPLANT
CATH EMB 4FR 80CM (CATHETERS) IMPLANT
CATH EMB 5FR 80CM (CATHETERS) IMPLANT
CLIP LIGATING EXTRA MED SLVR (CLIP) ×2 IMPLANT
CLIP LIGATING EXTRA SM BLUE (MISCELLANEOUS) ×2 IMPLANT
CLIP VESOCCLUDE MED 6/CT (CLIP) ×2 IMPLANT
CLIP VESOCCLUDE SM WIDE 6/CT (CLIP) ×2 IMPLANT
COVER PROBE W GEL 5X96 (DRAPES) ×2 IMPLANT
DERMABOND ADVANCED (GAUZE/BANDAGES/DRESSINGS) ×1
DERMABOND ADVANCED .7 DNX12 (GAUZE/BANDAGES/DRESSINGS) ×1 IMPLANT
ELECT REM PT RETURN 9FT ADLT (ELECTROSURGICAL) ×2
ELECTRODE REM PT RTRN 9FT ADLT (ELECTROSURGICAL) ×1 IMPLANT
GAUZE 4X4 16PLY ~~LOC~~+RFID DBL (SPONGE) ×2 IMPLANT
GAUZE SPONGE 4X4 12PLY STRL (GAUZE/BANDAGES/DRESSINGS) ×2 IMPLANT
GLOVE SURG ENC MOIS LTX SZ7.5 (GLOVE) ×2 IMPLANT
GLOVE SURG MICRO LTX SZ6.5 (GLOVE) ×2 IMPLANT
GOWN STRL REUS W/ TWL LRG LVL3 (GOWN DISPOSABLE) ×2 IMPLANT
GOWN STRL REUS W/ TWL XL LVL3 (GOWN DISPOSABLE) ×1 IMPLANT
GOWN STRL REUS W/TWL LRG LVL3 (GOWN DISPOSABLE) ×4
GOWN STRL REUS W/TWL XL LVL3 (GOWN DISPOSABLE) ×2
HEMOSTAT SNOW SURGICEL 2X4 (HEMOSTASIS) ×2 IMPLANT
KIT BASIN OR (CUSTOM PROCEDURE TRAY) ×2 IMPLANT
KIT TURNOVER KIT B (KITS) ×2 IMPLANT
NS IRRIG 1000ML POUR BTL (IV SOLUTION) ×2 IMPLANT
PACK CV ACCESS (CUSTOM PROCEDURE TRAY) ×2 IMPLANT
PAD ARMBOARD 7.5X6 YLW CONV (MISCELLANEOUS) ×4 IMPLANT
PATCH VASC XENOSURE 1CMX6CM (Vascular Products) ×2 IMPLANT
PATCH VASC XENOSURE 1X6 (Vascular Products) ×1 IMPLANT
PENCIL SMOKE EVACUATOR (MISCELLANEOUS) ×2 IMPLANT
SPONGE T-LAP 18X18 ~~LOC~~+RFID (SPONGE) ×2 IMPLANT
SUT ETHILON 3 0 PS 1 (SUTURE) ×2 IMPLANT
SUT MNCRL AB 4-0 PS2 18 (SUTURE) ×2 IMPLANT
SUT PROLENE 6 0 BV (SUTURE) ×6 IMPLANT
SUT VIC AB 3-0 SH 27 (SUTURE) ×2
SUT VIC AB 3-0 SH 27X BRD (SUTURE) ×1 IMPLANT
TAPE CLOTH SURG 4X10 WHT LF (GAUZE/BANDAGES/DRESSINGS) ×2 IMPLANT
TOWEL GREEN STERILE (TOWEL DISPOSABLE) ×2 IMPLANT
UNDERPAD 30X36 HEAVY ABSORB (UNDERPADS AND DIAPERS) ×2 IMPLANT
WATER STERILE IRR 1000ML POUR (IV SOLUTION) ×2 IMPLANT

## 2020-11-04 NOTE — Anesthesia Preprocedure Evaluation (Signed)
Anesthesia Evaluation  Patient identified by MRN, date of birth, ID band Patient unresponsive    Reviewed: Allergy & Precautions, H&P , NPO status , Patient's Chart, lab work & pertinent test results  Airway Mallampati: Intubated       Dental no notable dental hx. (+) Dental Advisory Given   Pulmonary asthma , pneumonia, former smoker,  Intubated and sedated   Pulmonary exam normal breath sounds clear to auscultation       Cardiovascular hypertension, Pt. on medications + Past MI, + Peripheral Vascular Disease and +CHF   Rhythm:Irregular Rate:Tachycardia     Neuro/Psych negative neurological ROS  negative psych ROS   GI/Hepatic negative GI ROS, Neg liver ROS,   Endo/Other  diabetes, Insulin Dependent  Renal/GU ESRF and DialysisRenal disease  negative genitourinary   Musculoskeletal  (+) Arthritis ,   Abdominal   Peds  Hematology  (+) Blood dyscrasia, anemia ,   Anesthesia Other Findings   Reproductive/Obstetrics negative OB ROS                             Anesthesia Physical Anesthesia Plan  ASA: 4  Anesthesia Plan: General   Post-op Pain Management:    Induction: Intravenous  PONV Risk Score and Plan: 3 and Treatment may vary due to age or medical condition  Airway Management Planned: Oral ETT  Additional Equipment:   Intra-op Plan:   Post-operative Plan: Post-operative intubation/ventilation  Informed Consent: I have reviewed the patients History and Physical, chart, labs and discussed the procedure including the risks, benefits and alternatives for the proposed anesthesia with the patient or authorized representative who has indicated his/her understanding and acceptance.     Dental advisory given  Plan Discussed with: CRNA  Anesthesia Plan Comments:         Anesthesia Quick Evaluation

## 2020-11-04 NOTE — Progress Notes (Signed)
[place] Progress Note Patient Name: Christy Zhang DOB: Nov 23, 1955 MRN: [id number]   Date of Service  11/04/2020  HPI/Events of Note  Alerted by nurse that patient transitioned from atrial arrhythmia to sinus brady in the 40's with associated hypotension that only lasted couple of minutes before return to atrial arrhythmia with rates in the 130's  eICU Interventions  Nurse to contact cardiology to consider need to stop amiodarone        Mauri [place], P 11/04/2020, 4:39 AM

## 2020-11-04 NOTE — Progress Notes (Signed)
NAME:  Christy Zhang, MRN:  [id number], DOB:  31-Oct-1955, LOS: 8 ADMISSION DATE:  10/27/2020, CONSULTATION DATE:  7/3 REFERRING MD:  Tyrell Antonio, CHIEF COMPLAINT:  dyspnea   Brief History of Present Illness:  65 y/o female admitted for presumed pneumonia and hypotension, required intubation, mechanical ventilation and CRRT in the setting of septic shock from pneumonia cauting acute respiratory failure with hypoxemia and AKI.  Pertinent  Medical History  CHF CKD T2DM ESRD HLD HTN  Significant Hospital Events: Including procedures, antibiotic start and stop dates in addition to other pertinent events   Admitted to hospital on 7/1 7/1 cefepime >  7/1 flagyl x1 7/1 vancomycin>  Admitted to ICU on 10/29/20 for CRRT 7/3 - brief PEA with bearing down while agitated. 7/3 ETT 7/3 left radial arterial line 7/3 L IJ HD cath 7/5 L pigtail CT placed for L pleural effusion 7/6 CT output -550 cc 7/8 A fib, ST elevation noted per EKG, Concern for ischemia, cards consulted  7/8>> [place] LUE >Occluded radial artery from the mid to distal segment. Ulnar artery patent  with calcification through the mid to distal segments. Appearance of a  non-obstructive lesion in the distal brachial artery.  7/7 >> CT Chest Abdomen  IMPRESSION: 3.5 cm heterogeneously enhancing abnormality is again noted arising from lower pole of left kidney concerning for possible neoplasm. Further evaluation with MRI with and without gadolinium is recommended.   Endotracheal tube is directed slightly into right mainstem bronchus; withdrawal by 2-3 cm is recommended.   Small right pleural effusion is noted with adjacent subsegmental atelectasis of right lower lobe.   Pigtail catheter is noted in left hemithorax. No pneumothorax is noted.   Mild gallbladder distention is now noted. No cholelithiasis or inflammatory changes noted.   Interim History / Subjective:  7/9: palliative care consulted yesterday after stemi and  worsening L hand ischemia 2/2 radial a. Occlusion. New changes on 5th digit as well today.    Objective   Blood pressure (!) 141/46, pulse (!) 122, temperature 97.9 F (36.6 C), temperature source Esophageal, resp. rate (!) 24, height '5\' 4"'$  (1.626 m), weight 85.3 kg, SpO2 96 %.    Vent Mode: PRVC FiO2 (%):  [40 %] 40 % Set Rate:  [24 bmp] 24 bmp Vt Set:  [440 mL] 440 mL PEEP:  [5 cmH20] 5 cmH20 Pressure Support:  [12 cmH20] 12 cmH20 Plateau Pressure:  [19 cmH20-20 cmH20] 20 cmH20   Intake/Output Summary (Last 24 hours) at 11/04/2020 0807 Last data filed at 11/04/2020 0700 Gross per 24 hour  Intake 4079.92 ml  Output 3947 ml  Net 132.92 ml   Filed Weights   11/02/20 0500 11/03/20 0500 11/04/20 0500  Weight: 85.2 kg 86.7 kg 85.3 kg    Examination: General:  critcally ill apearing on mech vent, weaning CPAP/PS , sedated lightly. Does stick out tongue to commands HEENT: MM pink/moist; ETT in place, HD cath per [facility] Neuro: sedated, per nursing follows some simple commands CV: irreg irreg S1, S2,  PULM:  Bilateral chest excursion , clear upper lobes, diminished per bases, Left CT removed  GI: soft, bsx4 active , No BM several days Extremities: warm/dry, bilateral BKA, Left hand with dark demarkation thumb and pinky finger. All other fingers, mottled, cool to touch, no cap refill.  Skin: no rashes or lesions,   7/1 blood culture > negative 7/1 urine culture > multiple species 7/2 SARS cov 2/flu > neg 7/7 trach culture >neg 7/7 blood cx: ngtd  CXR:  personally reviewed: no acute disease process; ETT in place, HD cath in place  Resolved Hospital Problem list     Assessment & Plan:  Acute respiratory failure with hypercarbia and hypoxemia due to severe community acquired pneumonia Full mechanical vent support L pleural effusion: Lights criteria 98% positive; pleural cultures sent 7/6 P: - continue mech vent support: PRVC - saturation goals are > 94% -daily SBTs/SAT once cleared  by cards -VAP prevention -follow pleural cultures: neg -follow trach cultures: neg -continue cefepime -CXR prn  Septic shock due to CAP P: - titrate pressors for MAP >65 -on vaso at this time.  - CT Abdomen, no obvious source of infection -continue merrem -trend fever/wbc curve (tmax 97.9) wbc improving to 23K -Vascular evaluated BKA wound and believes likely not the source of infection -L hand with worsening ischemic changes  Need for sedation for mechanical ventilation P: -continue sedation for RASS goal -2 -PAD protocol  Constipation P: - continue bowel regimen today - Dulcolax suppository daily prn  ESRD P: -Nephro following -continue CRRT w/ volume removal as able esp in light of ability to wean pressors to single agent at this time.  -trend bmp and uop  PAD/ PVD Occlusion of radial artery, ulnar artery with calcifications, distal brachial with non- obstructive lesion Per vascular surgery, no intervention due to critical illness at present time  P: -continue statin and plavix - Continue heparin gtt - Wean pressors  - Appreciate vascular surgery assist, possibly for OR time to see if salvageable - Mark line of demarkation to better follow extension of ischemia - No sticks L arm, no lines L arm  Atrial filbrillation New STEMI 7/8 >> not candidate for cath.  P: - continuous telemetry - continue IV heparin and IV amio  - cont titration of pressors.  - Repeat echo 7/8 post stemi with stable ef - Continue aspirin, Plavix, statin and heparin - No beta-blockade as she is on vasopressor - EKG prn - Maintain mag > 2, K > 4  DM2 with hyperglycemia Insulin gtt off P: -increasing basal insulin at 55 favor holding tf coverage if begins to decrease again.  -CBG monitoring -continue tube feeds  Goals of care:  -palliative care consulted   Best Practice (right click and "Reselect all SmartList Selections" daily)   Diet/type: tubefeeds DVT prophylaxis:  systemic heparin GI prophylaxis: PPI Lines: yes and it is still needed Foley:  N/A Code Status:  full code Last date of multidisciplinary goals of care discussion [7/6 updated daughter by phone]  Labs   CBC: Recent Labs  Lab 10/31/20 0323 11/01/20 0308 11/02/20 0341 11/02/20 0741 11/03/20 0401 11/04/20 0401  WBC 26.4* 27.1* 29.4*  --  28.2* 23.8*  NEUTROABS  --  22.8*  --   --   --   --   HGB 8.4* 8.1* 8.9* 10.9* 7.7* 7.6*  HCT 28.2* 27.8* 29.9* 32.0* 26.1* 25.4*  MCV 82.5 82.5 82.1  --  81.1 81.9  PLT 497* 449* 423*  --  [id number]    Basic Metabolic Panel: Recent Labs  Lab 10/31/20 0323 10/31/20 1600 11/01/20 0308 11/01/20 1600 11/02/20 0341 11/02/20 0741 11/02/20 1638 11/03/20 0401 11/03/20 1545 11/04/20 0401  NA 134*   < > 134*   < > 131* 136 135 134* 132* 134*  K 4.6   < > 4.4   < > 5.2* 4.8 4.4 5.0 4.6 4.1  CL 101   < > 102   < > 98  --  102 102 99 100  CO2 22   < > 23   < > 22  --  '22 23 24 25  '$ GLUCOSE 262*   < > 238*   < > 306*  --  154* 200* 252* 175*  BUN 16   < > 19   < > 21  --  30* '22 20 19  '$ CREATININE 2.13*   < > 1.75*   < > 1.65*  --  2.30* 1.68* 1.51* 1.49*  CALCIUM 8.2*   < > 8.1*   < > 8.8*  --  8.3* 8.7* 8.3* 8.4*  MG 2.4  --  2.3  --  2.7*  --   --  2.5*  --  2.6*  PHOS 2.7   < > 2.6   < > 3.6  --  5.6* 3.3 3.0 3.3   < > = values in this interval not displayed.   GFR: Estimated Creatinine Clearance: 40.8 mL/min (A) (by C-G formula based on SCr of 1.49 mg/dL (H)). Recent Labs  Lab 11/01/20 0308 11/02/20 0341 11/03/20 0401 11/04/20 0401  WBC 27.1* 29.4* 28.2* 23.8*    Liver Function Tests: Recent Labs  Lab 10/31/20 2220 11/01/20 0308 11/01/20 1600 11/02/20 0341 11/02/20 1638 11/03/20 0401 11/03/20 1545 11/04/20 0401  AST  --  109*  --  74*  --  87*  84*  --   --   ALT  --  142*  --  117*  --  77*  75*  --   --   ALKPHOS  --  79  --  93  --  85  84  --   --   BILITOT  --  0.8  --  0.8  --  0.8  1.1  --   --   PROT 6.5 6.5   --  7.7  --  7.4  7.1  --   --   ALBUMIN  --  2.2*   < > 2.4* 2.1* 2.1*  2.1* 2.1* 2.1*   < > = values in this interval not displayed.   No results for input(s): LIPASE, AMYLASE in the last 168 hours. No results for input(s): AMMONIA in the last 168 hours.  ABG    Component Value Date/Time   PHART 7.401 11/02/2020 0741   PCO2ART 41.2 11/02/2020 0741   PO2ART 135 (H) 11/02/2020 0741   HCO3 25.8 11/02/2020 0741   TCO2 27 11/02/2020 0741   ACIDBASEDEF 4.0 (H) 10/29/2020 1630   O2SAT 99.0 11/02/2020 0741      Coagulation Profile: No results for input(s): INR, PROTIME in the last 168 hours.   Cardiac Enzymes: No results for input(s): CKTOTAL, CKMB, CKMBINDEX, TROPONINI in the last 168 hours.  HbA1C: HB A1C (BAYER DCA - WAIVED)  Date/Time Value Ref Range Status  04/06/2020 11:05 AM 5.4 <7.0 % Final    Comment:                                          Diabetic Adult            <7.0                                       Healthy Adult        4.3 - 5.7                                                           (  DCCT/NGSP) American Diabetes Association's Summary of Glycemic Recommendations for Adults with Diabetes: Hemoglobin A1c <7.0%. More stringent glycemic goals (A1c <6.0%) may further reduce complications at the cost of increased risk of hypoglycemia.   01/06/2020 10:13 AM 6.2 <7.0 % Final    Comment:                                          Diabetic Adult            <7.0                                       Healthy Adult        4.3 - 5.7                                                           (DCCT/NGSP) American Diabetes Association's Summary of Glycemic Recommendations for Adults with Diabetes: Hemoglobin A1c <7.0%. More stringent glycemic goals (A1c <6.0%) may further reduce complications at the cost of increased risk of hypoglycemia.    Hgb A1c MFr Bld  Date/Time Value Ref Range Status  10/27/2020 06:48 PM 6.4 (H) 4.8 - 5.6 % Final    Comment:    (NOTE)          Prediabetes: 5.7 - 6.4         Diabetes: >6.4         Glycemic control for adults with diabetes: <7.0   08/09/2020 05:23 AM 6.5 (H) 4.8 - 5.6 % Final    Comment:    (NOTE) Pre diabetes:          5.7%-6.4%  Diabetes:              >6.4%  Glycemic control for   <7.0% adults with diabetes     CBG: Recent Labs  Lab 11/03/20 1542 11/03/20 1641 11/03/20 1957 11/04/20 0001 11/04/20 0403  GLUCAP 256* 261* 174* 237* 173*    Critical care time: The patient is critically ill with multiple organ systems failure and requires high complexity decision making for assessment and support, frequent evaluation and titration of therapies, application of advanced monitoring technologies and extensive interpretation of multiple databases.  Critical care time 39 mins. This represents my time independent of the NPs time taking care of the pt. This is excluding procedures.    Audria Nine DO [facility] 11/04/2020, 8:08 AM See Amion for pager If no response to pager, please call [phone] until 1900 After 1900 please call [facility] [phone]

## 2020-11-04 NOTE — Progress Notes (Signed)
Called cardiology MD regarding bradycardic event. MAP decreased with decrease in heart rate. Patient is back in NSR 80's.  MD recommended keeping amio. No new orders given.

## 2020-11-04 NOTE — Progress Notes (Signed)
[facility] [place] PROGRESS NOTE  ASSESSMENT / PLAN: Christy Zhang is a 65 y.o. female with progressive left hand ischemia after radial arterial line. She has a radial artery occlusion. She has ulnar flow and a brisk brachial signal. I cannot appreciate a palmar arch. Given the progression of ischemia, I think radial artery thrombectomy is indicated. She is quite tenuous clinically and may not be able to tolerate this intervention. I will discuss the case with PCCM to see if we can proceed to the OR. Continue heparin therapy. Wean from vasopressors as able.  SUBJECTIVE: Hemodynamically labile (HR 40-140). Still on vasopressin. Left hand appears worse this AM to bedside nurse.  OBJECTIVE: BP (!) 141/46   Pulse 80   Temp (!) 97.3 F (36.3 C) (Esophageal)   Resp (!) 24   Ht '5\' 4"'$  (1.626 m)   Wt 85.3 kg   LMP  (LMP Unknown)   SpO2 98%   BMI 32.28 kg/m   Intake/Output Summary (Last 24 hours) at 11/04/2020 1030 Last data filed at 11/04/2020 1000 Gross per 24 hour  Intake 3604.23 ml  Output 4231 ml  Net -626.77 ml    Constitutional: critically ill intubated.  Cardiac: RRR. Pulmonary: unlabored Abdomen: soft Vascular: left hand with dry gangrene of first, second, and fifth finger tips. Ischemic changes over the thenar eminence. Ulnar doppler flow at the wrist. No radial flow at the wrist. Brisk brachial signal.  CBC Latest Ref Rng & Units 11/04/2020 11/03/2020 11/02/2020  WBC 4.0 - 10.5 K/uL 23.8(H) 28.2(H) -  Hemoglobin 12.0 - 15.0 g/dL 7.6(L) 7.7(L) 10.9(L)  Hematocrit 36.0 - 46.0 % 25.4(L) 26.1(L) 32.0(L)  Platelets 150 - 400 K/uL 375 377 -     CMP Latest Ref Rng & Units 11/04/2020 11/03/2020 11/03/2020  Glucose 70 - 99 mg/dL 175(H) 252(H) -  BUN 8 - 23 mg/dL 19 20 -  Creatinine 0.44 - 1.00 mg/dL 1.49(H) 1.51(H) -  Sodium 135 - 145 mmol/L 134(L) 132(L) -  Potassium 3.5 - 5.1 mmol/L 4.1 4.6 -  Chloride 98 - 111 mmol/L 100 99 -  CO2 22 - 32 mmol/L 25 24 -   Calcium 8.9 - 10.3 mg/dL 8.4(L) 8.3(L) -  Total Protein 6.5 - 8.1 g/dL - - 7.4  Total Bilirubin 0.3 - 1.2 mg/dL - - 0.8  Alkaline Phos 38 - 126 U/L - - 85  AST 15 - 41 U/L - - 87(H)  ALT 0 - 44 U/L - - 77(H)    Estimated Creatinine Clearance: 40.8 mL/min (A) (by C-G formula based on SCr of 1.49 mg/dL (H)).  Yevonne Aline. Stanford Breed, MD [facility] Phone Number: [phone] 11/04/2020 10:30 AM

## 2020-11-04 NOTE — Op Note (Signed)
DATE OF SERVICE: 11/04/2020  PATIENT:  Christy Zhang  65 y.o. female  PRE-OPERATIVE DIAGNOSIS:  thrombosed left radial artery after catheterization causing left hand ischemia  POST-OPERATIVE DIAGNOSIS:  Same  PROCEDURE:   1) Left radial artery thrombectomy 2) Left radial artery patch angioplasty (bovine pericardium)  SURGEON:  Surgeon(s) and Role:    * Cherre Robins, MD - Primary  ASSISTANT: Risa Grill, PA-C  An assistant was required to facilitate exposure and expedite the case.  ANESTHESIA:   general  EBL: 66m  BLOOD ADMINISTERED:none  DRAINS: none   LOCAL MEDICATIONS USED:  NONE  SPECIMEN:  none  COUNTS: confirmed correct.  TOURNIQUET:  none  PATIENT DISPOSITION:  PACU - hemodynamically stable.   Delay start of Pharmacological VTE agent (>24hrs) due to surgical blood loss or risk of bleeding: no  INDICATION FOR PROCEDURE: Christy ADORNETTOis a 65y.o. female with recent left radial artery catheterization for blood pressure monitoring in setting of critical illness. This was removed and unfortunately caused radial artery thrombosis. She was initially on high dose vasopressor therapy. As vasopressors were weaned, her hand became worse. We felt it prudent to proceed with thrombectomy to try to salvage her hand. After careful discussion of risks, benefits, and alternatives the patient was offered thrombectomy. The patient's family understood and wished to proceed.  OPERATIVE FINDINGS: acute thrombus in radial artery succesfully thrombectomized. Artery heavily diseased. Repaired with bovine patch angioplasty.  DESCRIPTION OF PROCEDURE: After identification of the patient in the pre-operative holding area, the patient was transferred to the operating room. The patient was positioned supine on the operating room table. Anesthesia was induced. The left arm was prepped and draped in standard fashion. A surgical pause was performed confirming correct patient, procedure,  and operative location.  Using intraoperative ultrasound the course of the left radial artery was mapped on the skin of the left volar wrist.  A longitudinal incision was made in the wrist over the radial artery and carried down to the fascia.  The fascia was carefully incised sharply.  The radial artery was skeletonized for a 3 cm length in the distal wrist.  The artery was found to be heavily calcified and thrombosed.  A transverse arteriotomy was made with an 11 blade and acute thrombus encountered.  I passed a #2 Fogarty embolectomy balloon catheter proximally and retrieved a fair amount of fresh thrombus.  Unfortunately the artery was so diseased the balloon popped despite gentle passage.  I elected to make a longitudinal arteriotomy and manually retrieve the plug of thrombus.  Torrential inflow was achieved.  A bulldog clamp was applied to the artery proximally and distally and achieved hemostasis.  A #2 Fogarty embolectomy balloon catheter was then passed distally into the hand and retrieved a small amount of acute thrombus.  Modest backbleeding was encountered.  A bovine pericardial patch was brought onto the field and cut to size to allow patch angioplasty of the distal radial artery.  This was sewn in continuous running fashion with 6-0 Prolene suture using standard technique.  Christy Zhang prior to completion the patch was flushed and de-aired.  The repair was then completed.  Clamps were released on the radial artery and hemostasis achieved in the patch with several repair sutures.  A palpable pulse was felt throughout the radial artery.  A good distal radial artery signal was heard.  The hand appeared perfused.  Satisfied we ended the case here.  Heparin was not reversed.  The wound was closed  using 3-0 Vicryl and 3-0 nylon sutures.  A sterile bandage was applied.  Upon completion of the case instrument and sharps counts were confirmed correct. The patient was transferred to the ICU in critical but stable  condition. I was present for all portions of the procedure.  Christy Zhang. Stanford Breed, MD [facility] Phone Number: [phone] 11/04/2020 3:23 PM

## 2020-11-04 NOTE — Progress Notes (Signed)
[facility] NEPHROLOGY PROGRESS NOTE  Assessment/ Plan: Pt is a 65 y.o. yo female  with history of HTN, HLD, peripheral vascular disease status post bilateral BKA, DM, anemia, ESRD on HD MWF at [facility] presented with cough, shortness of breath and generalized weakness, seen as a consultation for the management of dialysis.  Dialysis Orders from previous admission:  [facility] - MWF 4 hrs 400/500 97 kg 2.0K/2.5 Ca 16g needles -Heparin 1000 units initial bolus-heparin pump 1000 units/hr stop 1 hour prior to end of tx  -Epogen 14000 units IV TIW -Hectorol 1.5 mcg IV TIW  #Septic shock due to left lung pneumonia: Patient was transferred to [facility]  Vent and pressor support per CCM.  Abx per primary service. Ct 7/7 without source of infection  #Possible inferior STEMI -not a candidate for intervention currently. On dapt and hep gtt.  #Possible left radial artery occlusion -vvs following, a/c per primary service  #Vent dependent respiratory failure due to both pneumonia and pulm edema.  Antibiotics as above and volume management with CRRT.  # ESRD MWF schedule.  Now on CRRT because of hemodynamic instability.  C/w CRRT.  On IV heparin for anticoagulation.  Keep net even for now but can try for net neg 50cc/hr provided hemodynamics allow -she is currently on DAPT and systemic heparin. Watch for now especially as her hgb has been downtrending. If continuously clotting then will need to consider adding heparin through CRRT circuit. In the interim will increase pre-filter bfr to 500cc/hr  #A. fib with RVR: Currently on amiodarone and heparin.  Cardiology is following.   # Anemia: Hemoglobin 7.6 today.  Continue to monitor.  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  #Hyponatremia, hypervolemic: improved/stable. Managed with CRRT.  #Pleural effusion -chest tube place 7/5, removed 7/7  Gean Quint, MD [facility]  Subjective: Seen and examined on CRRT in ICU.  Brady event overnight, brief. Per RN, excessive clots in cartridge and filter despite being on hep gtt. Remains on phenyl and vaso. Net positive around 160cc. Objective Vital signs in last 24 hours: Vitals:   11/04/20 0700 11/04/20 0715 11/04/20 0748 11/04/20 0752  BP:      Pulse: (!) 130 79  (!) 122  Resp: (!) 24 (!) 24  (!) 24  Temp:      TempSrc:      SpO2: 100% 100% 98% 96%  Weight:      Height:       Weight change: -1.4 kg  Intake/Output Summary (Last 24 hours) at 11/04/2020 0811 Last data filed at 11/04/2020 0700 Gross per 24 hour  Intake 4079.92 ml  Output 3947 ml  Net 132.92 ml       Labs: Basic Metabolic Panel: Recent Labs  Lab 11/03/20 0401 11/03/20 1545 11/04/20 0401  NA 134* 132* 134*  K 5.0 4.6 4.1  CL 102 99 100  CO2 _0 GLUCOSE 200* 252* 175*  BUN _1 CREATININE 1.68* 1.51* 1.49*  CALCIUM 8.7* 8.3* 8.4*  PHOS 3.3 3.0 3.3   Liver Function Tests: Recent Labs  Lab 11/01/20 0308 11/01/20 1600 11/02/20 0341 11/02/20 1638 11/03/20 0401 11/03/20 1545 11/04/20 0401  AST 109*  --  74*  --  87*  84*  --   --   ALT 142*  --  117*  --  77*  75*  --   --   ALKPHOS 79  --  93  --  85  84  --   --   BILITOT 0.8  --  0.8  --  0.8  1.1  --   --   PROT 6.5  --  7.7  --  7.4  7.1  --   --   ALBUMIN 2.2*   < > 2.4*   < > 2.1*  2.1* 2.1* 2.1*   < > = values in this interval not displayed.   No results for input(s): LIPASE, AMYLASE in the last 168 hours. No results for input(s): AMMONIA in the last 168 hours. CBC: Recent Labs  Lab 10/31/20 0323 11/01/20 0308 11/02/20 0341 11/02/20 0741 11/03/20 0401 11/04/20 0401  WBC 26.4* 27.1* 29.4*  --  28.2* 23.8*  NEUTROABS  --  22.8*  --   --   --   --   HGB 8.4* 8.1* 8.9* 10.9* 7.7* 7.6*  HCT 28.2* 27.8* 29.9* 32.0* 26.1* 25.4*  MCV 82.5 82.5 82.1  --  81.1 81.9  PLT 497* 449* 423*  --  377 375   Cardiac Enzymes: No results for  input(s): CKTOTAL, CKMB, CKMBINDEX, TROPONINI in the last 168 hours. CBG: Recent Labs  Lab 11/03/20 1542 11/03/20 1641 11/03/20 1957 11/04/20 0001 11/04/20 0403  GLUCAP 256* 261* 174* 237* 173*    Iron Studies: No results for input(s): IRON, TIBC, TRANSFERRIN, FERRITIN in the last 72 hours. Studies/Results: CT CHEST ABDOMEN PELVIS W CONTRAST  Result Date: 11/02/2020 CLINICAL DATA:  Sepsis.  Elevated white count. EXAM: CT CHEST, ABDOMEN, AND PELVIS WITH CONTRAST TECHNIQUE: Multidetector CT imaging of the chest, abdomen and pelvis was performed following the standard protocol during bolus administration of intravenous contrast. CONTRAST:  11m OMNIPAQUE IOHEXOL 300 MG/ML  SOLN COMPARISON:  December 29, 2019. FINDINGS: CT CHEST FINDINGS Cardiovascular: Atherosclerosis of thoracic aorta is noted without aneurysm or dissection. Coronary artery calcifications are noted. Normal cardiac size. Minimal pericardial effusion is noted. Mediastinum/Nodes: Thyroid gland is unremarkable. Endotracheal tube is directed slightly into right mainstem bronchus; withdrawal by 2-3 cm is recommended. Nasogastric tube is seen passing through esophagus and into stomach. No adenopathy is noted. Lungs/Pleura: No pneumothorax is noted. Small right pleural effusion is noted with adjacent subsegmental atelectasis of the right lower lobe. Pigtail chest tube is noted in the left hemithorax. Musculoskeletal: No chest wall mass or suspicious bone lesions identified. CT ABDOMEN PELVIS FINDINGS Hepatobiliary: No gallstones or biliary dilatation is noted. Mild gallbladder distention is now noted. The liver is unremarkable. Pancreas: Unremarkable. No pancreatic ductal dilatation or surrounding inflammatory changes. Spleen: Normal in size without focal abnormality. Adrenals/Urinary Tract: Adrenal glands appear normal. Bilateral renal cysts are noted. 3.5 cm heterogeneously enhancing abnormality is noted arising from lower pole of left  kidney concerning for possible renal cell carcinoma. No hydronephrosis or renal obstruction is noted. No renal or ureteral calculi are noted. Urinary bladder is unremarkable. Stomach/Bowel: Nasogastric tube tip is seen in proximal stomach. There is no evidence of bowel obstruction or inflammation. The appendix appears normal. Stool is noted throughout the colon. Vascular/Lymphatic: Aortic atherosclerosis. No enlarged abdominal or pelvic lymph nodes. Reproductive: Uterus and bilateral adnexa are unremarkable. Other: No abdominal wall hernia or abnormality. No abdominopelvic ascites. Musculoskeletal: No acute or significant osseous findings. IMPRESSION: 3.5 cm heterogeneously enhancing abnormality is again noted arising from lower pole of left kidney concerning for possible neoplasm. Further evaluation with MRI with and without gadolinium is recommended. Endotracheal tube is directed slightly into right mainstem bronchus; withdrawal by 2-3 cm is  recommended. Small right pleural effusion is noted with adjacent subsegmental atelectasis of right lower lobe. Pigtail catheter is noted in left hemithorax. No pneumothorax is noted. Mild gallbladder distention is now noted. No cholelithiasis or inflammatory changes noted. Aortic Atherosclerosis (ICD10-I70.0). Electronically Signed   By: Marijo Conception M.D.   On: 11/02/2020 16:46   DG CHEST PORT 1 VIEW  Result Date: 11/03/2020 CLINICAL DATA:  ETT present, altered mental status EXAM: PORTABLE CHEST 1 VIEW COMPARISON:  11/02/2020 FINDINGS: Endotracheal tube with the tip 3.2 cm above the carina. Nasogastric tube coursing below the diaphragm. Left jugular central venous catheter with the tip projecting over the upper SVC. Right subclavian stent again noted. Interval removal of the left-sided chest tube. No focal consolidation. No pleural effusion or pneumothorax. Stable cardiomediastinal silhouette. No acute osseous abnormality. IMPRESSION: 1. Interval removal of the  left-sided chest tube. No pneumothorax. 2. Endotracheal tube with the tip 3.2 cm above the carina. Electronically Signed   By: Kathreen Devoid   On: 11/03/2020 09:04   VAS [place] UPPER EXTREMITY ARTERIAL DUPLEX  Result Date: 11/03/2020  UPPER EXTREMITY DUPLEX STUDY Patient Name:  Christy Zhang  Date of Exam:   11/03/2020 Medical Rec #: [id number]          Accession #:    [id number] Date of Birth: 1955-10-29          Patient Gender: F Patient Age:   65Y Exam Location:  [facility] Procedure:      VAS [place] UPPER EXTREMITY ARTERIAL DUPLEX Referring Phys: [id number] MATTHEW EVELAND --------------------------------------------------------------------------------  Indications: Cold, blue hand, unable to doppler radial pulse. History:     Patient has a history of catheterization via left radial artery.  Other Factors: Vasopressors. Limitations: Bandaging, lines Comparison Study: No prior studies. Performing Technologist: Darlin Coco RDMS,RVT  Examination Guidelines: A complete evaluation includes B-mode imaging, spectral Doppler, color Doppler, and power Doppler as needed of all accessible portions of each vessel. Bilateral testing is considered an integral part of a complete examination. Limited examinations for reoccurring indications may be performed as noted.  Left Doppler Findings: +---------------+----------+------------------+--------+-----------------------+ Site           PSV (cm/s)Waveform          StenosisComments                +---------------+----------+------------------+--------+-----------------------+ Subclavian Prox54        audibly triphasic         Bandaging- poor angle                                                      of insonation           +---------------+----------+------------------+--------+-----------------------+ Subclavian Mid                                     Bandaging               +---------------+----------+------------------+--------+-----------------------+  Subclavian Dist                                    Bandaging               +---------------+----------+------------------+--------+-----------------------+ Axillary  98        triphasic                                         +---------------+----------+------------------+--------+-----------------------+ Brachial Prox  103       triphasic                                         +---------------+----------+------------------+--------+-----------------------+ Brachial Mid   96        triphasic                                         +---------------+----------+------------------+--------+-----------------------+ Brachial Dist  90        biphasic                  Non-obstructive lesion                                                     identified at the                                                          distal brachial         +---------------+----------+------------------+--------+-----------------------+ Radial Prox    20        monophasic                                        +---------------+----------+------------------+--------+-----------------------+ Radial Mid     23        monophasic                                        +---------------+----------+------------------+--------+-----------------------+ Radial Dist                                occluded                        +---------------+----------+------------------+--------+-----------------------+ Ulnar Prox     85        biphasic                                          +---------------+----------+------------------+--------+-----------------------+ Ulnar Mid      28        monophasic                Calcified appearance    +---------------+----------+------------------+--------+-----------------------+ Ulnar Dist     7         dampened  Calcified appearance                             monophasic                                         +---------------+----------+------------------+--------+-----------------------+ Occluded radial artery from the mid to distal segment. Ulnar artery patent with calcification through the mid to distal segments. Appearance of a non-obstructive lesion in the distal brachial artery.  Summary:  Left: Obstruction noted in the radial artery. *See table(s) above for measurements and observations.    Preliminary    ECHOCARDIOGRAM LIMITED  Result Date: 11/03/2020    ECHOCARDIOGRAM LIMITED REPORT   Patient Name:   Christy Zhang Date of Exam: 11/03/2020 Medical Rec #:  [id number]         Height:       64.0 in Accession #:    [id number]        Weight:       191.1 lb Date of Birth:  December 27, 1955         BSA:          1.919 m Patient Age:    36 years          BP:           114/43 mmHg Patient Gender: F                 HR:           80 bpm. Exam Location:  Inpatient Procedure: Limited Color Doppler, Color Doppler and Cardiac Doppler Indications:    acute myocardial infarction  History:        Patient has prior history of Echocardiogram examinations, most                 recent 10/29/2020. End stage renal disease; Risk                 Factors:Hypertension.  Sonographer:    Johny Chess Referring Phys: [id number] [place]  1. Left ventricular ejection fraction, by estimation, is 60 to 65%. The left ventricle has normal function. The left ventricle has no regional wall motion abnormalities. There is moderate left ventricular hypertrophy. Left ventricular diastolic parameters are indeterminate.  2. Right ventricular systolic function is mildly reduced. The right ventricular size is normal. Tricuspid regurgitation signal is inadequate for assessing PA pressure.  3. A small pericardial effusion is present.  4. The mitral valve is normal in structure. No evidence of mitral valve regurgitation. No evidence of mitral stenosis.  5. The aortic valve is tricuspid. Aortic valve regurgitation is not visualized. Mild aortic  valve sclerosis is present, with no evidence of aortic valve stenosis.  6. The inferior vena cava is normal in size with <50% respiratory variability, suggesting right atrial pressure of 8 mmHg. FINDINGS  Left Ventricle: Left ventricular ejection fraction, by estimation, is 60 to 65%. The left ventricle has normal function. The left ventricle has no regional wall motion abnormalities. The left ventricular internal cavity size was small. There is moderate  left ventricular hypertrophy. Left ventricular diastolic parameters are indeterminate. Right Ventricle: The right ventricular size is normal. Right ventricular systolic function is mildly reduced. Tricuspid regurgitation signal is inadequate for assessing PA pressure. Pericardium: A small pericardial effusion is present. Mitral Valve: The mitral valve is normal in  structure. No evidence of mitral valve stenosis. Tricuspid Valve: The tricuspid valve is normal in structure. Tricuspid valve regurgitation is trivial. Aortic Valve: The aortic valve is tricuspid. Aortic valve regurgitation is not visualized. Mild aortic valve sclerosis is present, with no evidence of aortic valve stenosis. Pulmonic Valve: The pulmonic valve was not well visualized. Pulmonic valve regurgitation is not visualized. Venous: The inferior vena cava is normal in size with less than 50% respiratory variability, suggesting right atrial pressure of 8 mmHg. LEFT VENTRICLE PLAX 2D LVIDd:         3.70 cm Diastology LVIDs:         2.70 cm LV e' medial:    6.85 cm/s LV PW:         1.30 cm LV E/e' medial:  8.1 LV IVS:        1.20 cm LV e' lateral:   7.72 cm/s                        LV E/e' lateral: 7.2  IVC IVC diam: 2.00 cm LEFT ATRIUM         Index LA diam:    3.70 cm 1.93 cm/m  AORTIC VALVE LVOT Vmax:   84.00 cm/s LVOT Vmean:  55.300 cm/s LVOT VTI:    0.154 m MITRAL VALVE MV Area (PHT): 2.66 cm    SHUNTS MV Decel Time: 285 msec    Systemic VTI: 0.15 m MV E velocity: 55.70 cm/s MV A velocity: 54.80  cm/s MV E/A ratio:  1.02 Oswaldo Milian MD Electronically signed by Oswaldo Milian MD Signature Date/Time: 11/03/2020/1:53:23 PM    Final     Medications: Infusions:   prismasol BGK 4/2.5 400 mL/hr at 11/03/20 2031    prismasol BGK 4/2.5 200 mL/hr at 11/03/20 2036   sodium chloride 5 mL/hr at 11/04/20 0700   amiodarone 30 mg/hr (11/04/20 0700)   feeding supplement (VITAL 1.5 CAL) 1,000 mL (11/03/20 1434)   fentaNYL infusion INTRAVENOUS 175 mcg/hr (11/04/20 0700)   heparin 1,050 Units/hr (11/04/20 0700)   insulin Stopped (11/02/20 1646)   meropenem (MERREM) IV Stopped (11/04/20 7169)   norepinephrine (LEVOPHED) Adult infusion Stopped (11/03/20 0208)   phenylephrine (NEO-SYNEPHRINE) Adult infusion Stopped (11/04/20 0612)   prismasol BGK 2/2.5 dialysis solution 1,800 mL/hr at 11/04/20 0646   propofol (DIPRIVAN) infusion Stopped (11/02/20 1515)   vancomycin Stopped (11/03/20 2314)   vasopressin 0.03 Units/min (11/04/20 0700)    Scheduled Medications:  aspirin  81 mg Per Tube Daily   atorvastatin  40 mg Per Tube Daily   B-complex with vitamin C  1 tablet Per Tube Daily   chlorhexidine gluconate (MEDLINE KIT)  15 mL Mouth Rinse BID   Chlorhexidine Gluconate Cloth  6 each Topical Q0600   clopidogrel  75 mg Per Tube Daily   darbepoetin (ARANESP) injection - DIALYSIS  100 mcg Intravenous Q Tue-HD   docusate  100 mg Per Tube Daily   feeding supplement (PROSource TF)  45 mL Per Tube QID   fentaNYL (SUBLIMAZE) injection  100 mcg Intravenous Once   fentaNYL (SUBLIMAZE) injection  50 mcg Intravenous Once   guaiFENesin  15 mL Per Tube Q6H   insulin aspart  3-9 Units Subcutaneous Q4H   insulin aspart  4 Units Subcutaneous Q4H   insulin glargine  50 Units Subcutaneous Q12H   ipratropium-albuterol  3 mL Nebulization TID   mouth rinse  15 mL Mouth Rinse 10 times per day   pantoprazole sodium  40 mg Per Tube Daily   polyethylene glycol  17 g Per Tube Daily   sodium chloride flush   10-40 mL Intracatheter Q12H    have reviewed scheduled and prn medications.  Physical Exam: General: Critically ill looking female intubated, sedated Heart:RRR, s1s2 nl Lungs: vent, b/l chest expansion Abdomen:soft,  non-distended Extremities: Bilateral BKA, no stump/thigh edema, left hand mottled Dialysis Access: Left IJ temporary HD catheter for CRRT.  AV fistula has good thrill and bruit.    11/04/2020,8:11 AM  LOS: 8 days

## 2020-11-04 NOTE — Progress Notes (Signed)
Dr Stanford Breed at bedside.  Approval received by Dr Stanford Breed to place new PIV by IV team ( as long as piv above incision).

## 2020-11-04 NOTE — Progress Notes (Signed)
Progress Note  Patient Name: Christy Zhang Date of Encounter: 11/04/2020  Primary Cardiologist: None   Subjective   Overnight temporary heart rate conversion to 40 then returned to AF.  L hand perfusion poor but improved.  On CRRT.  Intubated and opens eyes  Inpatient Medications    Scheduled Meds:  aspirin  81 mg Per Tube Daily   atorvastatin  40 mg Per Tube Daily   B-complex with vitamin C  1 tablet Per Tube Daily   chlorhexidine gluconate (MEDLINE KIT)  15 mL Mouth Rinse BID   Chlorhexidine Gluconate Cloth  6 each Topical Q0600   clopidogrel  75 mg Per Tube Daily   darbepoetin (ARANESP) injection - DIALYSIS  100 mcg Intravenous Q Tue-HD   docusate  100 mg Per Tube Daily   feeding supplement (PROSource TF)  45 mL Per Tube QID   fentaNYL (SUBLIMAZE) injection  100 mcg Intravenous Once   fentaNYL (SUBLIMAZE) injection  50 mcg Intravenous Once   guaiFENesin  15 mL Per Tube Q6H   insulin aspart  3-9 Units Subcutaneous Q4H   insulin aspart  4 Units Subcutaneous Q4H   insulin glargine  50 Units Subcutaneous Q12H   ipratropium-albuterol  3 mL Nebulization TID   mouth rinse  15 mL Mouth Rinse 10 times per day   pantoprazole sodium  40 mg Per Tube Daily   polyethylene glycol  17 g Per Tube Daily   sodium chloride flush  10-40 mL Intracatheter Q12H   Continuous Infusions:   prismasol BGK 4/2.5 500 mL/hr at 11/04/20 0824    prismasol BGK 4/2.5 200 mL/hr at 11/03/20 2036   sodium chloride 5 mL/hr at 11/04/20 0700   amiodarone 30 mg/hr (11/04/20 0823)   feeding supplement (VITAL 1.5 CAL) 1,000 mL (11/03/20 1434)   fentaNYL infusion INTRAVENOUS 175 mcg/hr (11/04/20 0700)   heparin 1,050 Units/hr (11/04/20 0700)   meropenem (MERREM) IV Stopped (11/04/20 5027)   norepinephrine (LEVOPHED) Adult infusion Stopped (11/03/20 0208)   phenylephrine (NEO-SYNEPHRINE) Adult infusion Stopped (11/04/20 0612)   prismasol BGK 2/2.5 dialysis solution 1,800 mL/hr at 11/04/20 0646   propofol  (DIPRIVAN) infusion Stopped (11/02/20 1515)   vancomycin Stopped (11/03/20 2314)   vasopressin 0.03 Units/min (11/04/20 0700)   PRN Meds: sodium chloride, acetaminophen (TYLENOL) oral liquid 160 mg/5 mL, albuterol, alteplase, bisacodyl, heparin, hydrALAZINE, metoprolol tartrate, midazolam, sodium chloride, sodium chloride flush   Vital Signs    Vitals:   11/04/20 0715 11/04/20 0748 11/04/20 0752 11/04/20 0800  BP:      Pulse: 79  (!) 122 82  Resp: (!) 24  (!) 24 (!) 24  Temp:    (!) 97.3 F (36.3 C)  TempSrc:    Esophageal  SpO2: 100% 98% 96% 100%  Weight:      Height:        Intake/Output Summary (Last 24 hours) at 11/04/2020 0827 Last data filed at 11/04/2020 0700 Gross per 24 hour  Intake 4079.92 ml  Output 3947 ml  Net 132.92 ml   Filed Weights   11/02/20 0500 11/03/20 0500 11/04/20 0500  Weight: 85.2 kg 86.7 kg 85.3 kg    Telemetry    Inermittent PAF (RVR) - Personally Reviewed  ECG    SR 70 diffuse TWI - Personally Reviewed  Physical Exam   GEN: Tracks and opens eyes Neck: Unable to assess JVD Cardiac: Reguraly rate to irregular tachycardia through exam, continue murmur radiating from R AV fistula Respiratory: Mechanical BS GI: Soft, nontender, non-distended  MS: No edema; leg stump sites C/D/I, mottled L hand Neuro:  Nonfocal  Psych: intubated  Labs    Chemistry Recent Labs  Lab 11/01/20 0308 11/01/20 1600 11/02/20 0341 11/02/20 0741 11/03/20 0401 11/03/20 1545 11/04/20 0401  NA 134*   < > 131*   < > 134* 132* 134*  K 4.4   < > 5.2*   < > 5.0 4.6 4.1  CL 102   < > 98   < > 102 99 100  CO2 23   < > 22   < > _0 GLUCOSE 238*   < > 306*   < > 200* 252* 175*  BUN 19   < > 21   < > _1 CREATININE 1.75*   < > 1.65*   < > 1.68* 1.51* 1.49*  CALCIUM 8.1*   < > 8.8*   < > 8.7* 8.3* 8.4*  PROT 6.5  --  7.7  --  7.4  7.1  --   --   ALBUMIN 2.2*   < > 2.4*   < > 2.1*  2.1* 2.1* 2.1*  AST 109*  --  74*  --  87*  84*  --   --   ALT 142*   --  117*  --  77*  75*  --   --   ALKPHOS 79  --  93  --  85  84  --   --   BILITOT 0.8  --  0.8  --  0.8  1.1  --   --   GFRNONAA 32*   < > 35*   < > 34* 39* 39*  ANIONGAP 9   < > 11   < > _2 < > = values in this interval not displayed.     Hematology Recent Labs  Lab 11/02/20 0341 11/02/20 0741 11/03/20 0401 11/04/20 0401  WBC 29.4*  --  28.2* 23.8*  RBC 3.64*  --  3.22* 3.10*  HGB 8.9* 10.9* 7.7* 7.6*  HCT 29.9* 32.0* 26.1* 25.4*  MCV 82.1  --  81.1 81.9  MCH 24.5*  --  23.9* 24.5*  MCHC 29.8*  --  29.5* 29.9*  RDW 20.4*  --  20.9* 21.4*  PLT 423*  --  377 375    Cardiac EnzymesNo results for input(s): TROPONINI in the last 168 hours. No results for input(s): TROPIPOC in the last 168 hours.   BNP Recent Labs  Lab 11/04/20 0401  BNP 230.6*     DDimer No results for input(s): DDIMER in the last 168 hours.   Radiology    CT CHEST ABDOMEN PELVIS W CONTRAST  Result Date: 11/02/2020 CLINICAL DATA:  Sepsis.  Elevated white count. EXAM: CT CHEST, ABDOMEN, AND PELVIS WITH CONTRAST TECHNIQUE: Multidetector CT imaging of the chest, abdomen and pelvis was performed following the standard protocol during bolus administration of intravenous contrast. CONTRAST:  16m OMNIPAQUE IOHEXOL 300 MG/ML  SOLN COMPARISON:  December 29, 2019. FINDINGS: CT CHEST FINDINGS Cardiovascular: Atherosclerosis of thoracic aorta is noted without aneurysm or dissection. Coronary artery calcifications are noted. Normal cardiac size. Minimal pericardial effusion is noted. Mediastinum/Nodes: Thyroid gland is unremarkable. Endotracheal tube is directed slightly into right mainstem bronchus; withdrawal by 2-3 cm is recommended. Nasogastric tube is seen passing through esophagus and into stomach. No adenopathy is noted. Lungs/Pleura: No pneumothorax is noted. Small right pleural effusion is noted with adjacent subsegmental atelectasis of the right lower lobe.  Pigtail chest tube is noted in the left  hemithorax. Musculoskeletal: No chest wall mass or suspicious bone lesions identified. CT ABDOMEN PELVIS FINDINGS Hepatobiliary: No gallstones or biliary dilatation is noted. Mild gallbladder distention is now noted. The liver is unremarkable. Pancreas: Unremarkable. No pancreatic ductal dilatation or surrounding inflammatory changes. Spleen: Normal in size without focal abnormality. Adrenals/Urinary Tract: Adrenal glands appear normal. Bilateral renal cysts are noted. 3.5 cm heterogeneously enhancing abnormality is noted arising from lower pole of left kidney concerning for possible renal cell carcinoma. No hydronephrosis or renal obstruction is noted. No renal or ureteral calculi are noted. Urinary bladder is unremarkable. Stomach/Bowel: Nasogastric tube tip is seen in proximal stomach. There is no evidence of bowel obstruction or inflammation. The appendix appears normal. Stool is noted throughout the colon. Vascular/Lymphatic: Aortic atherosclerosis. No enlarged abdominal or pelvic lymph nodes. Reproductive: Uterus and bilateral adnexa are unremarkable. Other: No abdominal wall hernia or abnormality. No abdominopelvic ascites. Musculoskeletal: No acute or significant osseous findings. IMPRESSION: 3.5 cm heterogeneously enhancing abnormality is again noted arising from lower pole of left kidney concerning for possible neoplasm. Further evaluation with MRI with and without gadolinium is recommended. Endotracheal tube is directed slightly into right mainstem bronchus; withdrawal by 2-3 cm is recommended. Small right pleural effusion is noted with adjacent subsegmental atelectasis of right lower lobe. Pigtail catheter is noted in left hemithorax. No pneumothorax is noted. Mild gallbladder distention is now noted. No cholelithiasis or inflammatory changes noted. Aortic Atherosclerosis (ICD10-I70.0). Electronically Signed   By: Marijo Conception M.D.   On: 11/02/2020 16:46   DG CHEST PORT 1 VIEW  Result Date:  11/03/2020 CLINICAL DATA:  ETT present, altered mental status EXAM: PORTABLE CHEST 1 VIEW COMPARISON:  11/02/2020 FINDINGS: Endotracheal tube with the tip 3.2 cm above the carina. Nasogastric tube coursing below the diaphragm. Left jugular central venous catheter with the tip projecting over the upper SVC. Right subclavian stent again noted. Interval removal of the left-sided chest tube. No focal consolidation. No pleural effusion or pneumothorax. Stable cardiomediastinal silhouette. No acute osseous abnormality. IMPRESSION: 1. Interval removal of the left-sided chest tube. No pneumothorax. 2. Endotracheal tube with the tip 3.2 cm above the carina. Electronically Signed   By: Kathreen Devoid   On: 11/03/2020 09:04   VAS [place] UPPER EXTREMITY ARTERIAL DUPLEX  Result Date: 11/03/2020  UPPER EXTREMITY DUPLEX STUDY Patient Name:  Christy Zhang  Date of Exam:   11/03/2020 Medical Rec #: [id number]          Accession #:    [id number] Date of Birth: 1955-08-27          Patient Gender: F Patient Age:   65Y Exam Location:  [facility] Procedure:      VAS [place] UPPER EXTREMITY ARTERIAL DUPLEX Referring Phys: [id number] MATTHEW EVELAND --------------------------------------------------------------------------------  Indications: Cold, blue hand, unable to doppler radial pulse. History:     Patient has a history of catheterization via left radial artery.  Other Factors: Vasopressors. Limitations: Bandaging, lines Comparison Study: No prior studies. Performing Technologist: Darlin Coco RDMS,RVT  Examination Guidelines: A complete evaluation includes B-mode imaging, spectral Doppler, color Doppler, and power Doppler as needed of all accessible portions of each vessel. Bilateral testing is considered an integral part of a complete examination. Limited examinations for reoccurring indications may be performed as noted.  Left Doppler Findings: +---------------+----------+------------------+--------+-----------------------+ Site            PSV (cm/s)Waveform  StenosisComments                +---------------+----------+------------------+--------+-----------------------+ Subclavian Prox54        audibly triphasic         Bandaging- poor angle                                                      of insonation           +---------------+----------+------------------+--------+-----------------------+ Subclavian Mid                                     Bandaging               +---------------+----------+------------------+--------+-----------------------+ Subclavian Dist                                    Bandaging               +---------------+----------+------------------+--------+-----------------------+ Axillary       98        triphasic                                         +---------------+----------+------------------+--------+-----------------------+ Brachial Prox  103       triphasic                                         +---------------+----------+------------------+--------+-----------------------+ Brachial Mid   96        triphasic                                         +---------------+----------+------------------+--------+-----------------------+ Brachial Dist  90        biphasic                  Non-obstructive lesion                                                     identified at the                                                          distal brachial         +---------------+----------+------------------+--------+-----------------------+ Radial Prox    20        monophasic                                        +---------------+----------+------------------+--------+-----------------------+ Radial Mid     23        monophasic                                        +---------------+----------+------------------+--------+-----------------------+  Radial Dist                                occluded                         +---------------+----------+------------------+--------+-----------------------+ Ulnar Prox     85        biphasic                                          +---------------+----------+------------------+--------+-----------------------+ Ulnar Mid      28        monophasic                Calcified appearance    +---------------+----------+------------------+--------+-----------------------+ Ulnar Dist     7         dampened                  Calcified appearance                             monophasic                                        +---------------+----------+------------------+--------+-----------------------+ Occluded radial artery from the mid to distal segment. Ulnar artery patent with calcification through the mid to distal segments. Appearance of a non-obstructive lesion in the distal brachial artery.  Summary:  Left: Obstruction noted in the radial artery. *See table(s) above for measurements and observations.    Preliminary    ECHOCARDIOGRAM LIMITED  Result Date: 11/03/2020    ECHOCARDIOGRAM LIMITED REPORT   Patient Name:   Christy Zhang Date of Exam: 11/03/2020 Medical Rec #:  [id number]         Height:       64.0 in Accession #:    [id number]        Weight:       191.1 lb Date of Birth:  [birth year]         BSA:          1.919 m Patient Age:    13 years          BP:           114/43 mmHg Patient Gender: F                 HR:           80 bpm. Exam Location:  Inpatient Procedure: Limited Color Doppler, Color Doppler and Cardiac Doppler Indications:    acute myocardial infarction  History:        Patient has prior history of Echocardiogram examinations, most                 recent 10/29/2020. End stage renal disease; Risk                 Factors:Hypertension.  Sonographer:    Johny Chess Referring Phys: [id number] [place]  1. Left ventricular ejection fraction, by estimation, is 60 to 65%. The left ventricle has normal function. The left ventricle has no  regional wall motion abnormalities. There is moderate left ventricular hypertrophy. Left ventricular diastolic parameters are indeterminate.  2. Right  ventricular systolic function is mildly reduced. The right ventricular size is normal. Tricuspid regurgitation signal is inadequate for assessing PA pressure.  3. A small pericardial effusion is present.  4. The mitral valve is normal in structure. No evidence of mitral valve regurgitation. No evidence of mitral stenosis.  5. The aortic valve is tricuspid. Aortic valve regurgitation is not visualized. Mild aortic valve sclerosis is present, with no evidence of aortic valve stenosis.  6. The inferior vena cava is normal in size with <50% respiratory variability, suggesting right atrial pressure of 8 mmHg. FINDINGS  Left Ventricle: Left ventricular ejection fraction, by estimation, is 60 to 65%. The left ventricle has normal function. The left ventricle has no regional wall motion abnormalities. The left ventricular internal cavity size was small. There is moderate  left ventricular hypertrophy. Left ventricular diastolic parameters are indeterminate. Right Ventricle: The right ventricular size is normal. Right ventricular systolic function is mildly reduced. Tricuspid regurgitation signal is inadequate for assessing PA pressure. Pericardium: A small pericardial effusion is present. Mitral Valve: The mitral valve is normal in structure. No evidence of mitral valve stenosis. Tricuspid Valve: The tricuspid valve is normal in structure. Tricuspid valve regurgitation is trivial. Aortic Valve: The aortic valve is tricuspid. Aortic valve regurgitation is not visualized. Mild aortic valve sclerosis is present, with no evidence of aortic valve stenosis. Pulmonic Valve: The pulmonic valve was not well visualized. Pulmonic valve regurgitation is not visualized. Venous: The inferior vena cava is normal in size with less than 50% respiratory variability, suggesting right atrial  pressure of 8 mmHg. LEFT VENTRICLE PLAX 2D LVIDd:         3.70 cm Diastology LVIDs:         2.70 cm LV e' medial:    6.85 cm/s LV PW:         1.30 cm LV E/e' medial:  8.1 LV IVS:        1.20 cm LV e' lateral:   7.72 cm/s                        LV E/e' lateral: 7.2  IVC IVC diam: 2.00 cm LEFT ATRIUM         Index LA diam:    3.70 cm 1.93 cm/m  AORTIC VALVE LVOT Vmax:   84.00 cm/s LVOT Vmean:  55.300 cm/s LVOT VTI:    0.154 m MITRAL VALVE MV Area (PHT): 2.66 cm    SHUNTS MV Decel Time: 285 msec    Systemic VTI: 0.15 m MV E velocity: 55.70 cm/s MV A velocity: 54.80 cm/s MV E/A ratio:  1.02 Oswaldo Milian MD Electronically signed by Oswaldo Milian MD Signature Date/Time: 11/03/2020/1:53:23 PM    Final      Patient Profile     65 y.o. female PNA s/p chest tube (7/5 start with 7/7 removal) new AF, ESRD, and PAD (bilateral amputee)  Assessment & Plan    Coronary Artery Calcifications Aortic Atherosclerosis and TWI ESRD Shock PAD DM - troponin curve peak of 119 novel troponin; not suggestive of STEMI, but TWI consistent with underlying ischemia, continue DAPT and heparin - hypotensive CO by Echo ~ 3 L; mixed picture shock component - not a candidate for AHF therapies with profound vasculopathy; similarly with PAD unclear how we would successfully eval by LHC - continue Amiodarone; if additional hemodynamic significant bradycardia; stop amiodarone and can transition to PO amiodarone 400 mg PO BID - continue statin - high risk of mortality  this admission; PC consult is reasonable if primary in agreement  Discussed with nursing  CRITICAL CARE Performed by: Serrita Lueth A Tsuyako Jolley  Total critical care time: 35 minutes. Critical care time was exclusive of separately billable procedures and treating other patients. Critical care was necessary to treat or prevent imminent or life-threatening deterioration. Critical care was time spent personally by me on the following activities: development of  treatment plan with patient and/or surrogate as well as nursing, discussions with consultants, evaluation of patient's response to treatment, examination of patient, obtaining history from patient or surrogate, ordering and performing treatments and interventions, ordering and review of laboratory studies, ordering and review of radiographic studies, pulse oximetry and re-evaluation of patient's condition.    Signed, Rudean Haskell, MD [place]  11/04/2020 8:55 AM       For questions or updates, please contact [place] Please consult [email or web address] for contact info under Cardiology/STEMI.      Signed, Werner Lean, MD  11/04/2020, 8:27 AM

## 2020-11-04 NOTE — Progress Notes (Signed)
ANTICOAGULATION CONSULT NOTE  Pharmacy Consult for Heparin  Indication: atrial fibrillation  Allergies  Allergen Reactions   Sulfa Antibiotics Swelling    Patient Measurements: Height: '5\' 4"'$  (162.6 cm) Weight: 85.3 kg (188 lb 0.8 oz) IBW/kg (Calculated) : 54.7   Vital Signs: Temp: 97.3 F (36.3 C) (07/09 1200) Temp Source: Esophageal (07/09 1200) Pulse Rate: 138 (07/09 1230)  Labs: Recent Labs    11/02/20 0341 11/02/20 0741 11/02/20 1638 11/03/20 0401 11/03/20 0516 11/03/20 0750 11/03/20 1325 11/03/20 1545 11/04/20 0401 11/04/20 0458  HGB 8.9* 10.9*  --  7.7*  --   --   --   --  7.6*  --   HCT 29.9* 32.0*  --  26.1*  --   --   --   --  25.4*  --   PLT 423*  --   --  377  --   --   --   --  375  --   HEPARINUNFRC 0.69  --   --  0.80*  --   --  0.56  --  0.42  --   CREATININE 1.65*  --    < > 1.68*  --   --   --  1.51* 1.49*  --   TROPONINIHS  --   --   --   --  119* 115*  --   --   --  91*   < > = values in this interval not displayed.     Estimated Creatinine Clearance: 40.8 mL/min (A) (by C-G formula based on SCr of 1.49 mg/dL (H)).   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  She is also noted to be in afib/flutter  > SR on IV amiodarone IV heparin per pharmacy  Heparin level at goal (0.42) on gtt at 1050 units/hr. No bleeding noted per RN. Hgb low 7-8, pltc stable no bleeding noted  To OR today for hand ischemia - f/u post op   Goal of Therapy:  Heparin level 0.3-0.7 units/ml Monitor platelets by anticoagulation protocol: Yes   Plan:  Continue IV heparin  1050 units/hr. Daily heparin level and CBC.    Bonnita Nasuti Pharm.D. CPP, BCPS Clinical Pharmacist [phone] 11/04/2020 1:40 PM     [facility] pharmacy phone numbers are listed on [email or web address]

## 2020-11-04 NOTE — Anesthesia Postprocedure Evaluation (Signed)
Anesthesia Post Note  Patient: Christy Zhang  Procedure(s) Performed: THROMBECTOMY RADIAL ARTERY with Bovine Patching. (Left: Arm Lower)     Patient location during evaluation: SICU Anesthesia Type: General Level of consciousness: sedated Pain management: pain level controlled Vital Signs Assessment: post-procedure vital signs reviewed and stable Respiratory status: patient remains intubated per anesthesia plan Cardiovascular status: stable Postop Assessment: no apparent nausea or vomiting Anesthetic complications: no   No notable events documented.  Last Vitals:  Vitals:   11/04/20 1345 11/04/20 1550  BP:    Pulse:  79  Resp:  (!) 24  Temp: (!) 36.1 C   SpO2:  93%    Last Pain:  Vitals:   11/04/20 1345  TempSrc: Esophageal  PainSc:                  Thorne Wirz,W. EDMOND

## 2020-11-04 NOTE — Consult Note (Signed)
Palliative Care Consult Note                                  Date: 11/04/2020   Patient Name: Christy Zhang  DOB: 09/25/1955  MRN: [id number]  Age / Sex: 65 y.o., female  PCP: Claretta Fraise, MD Referring Physician: Juanito Doom, MD  Reason for Consultation: Establishing goals of care  HPI/Patient Profile: 65 y.o. female  with past medical history of HTN, HLD, peripheral vascular disease status post bilateral BKA, DM, anemia, ESRD on HD MWF at [facility] admitted on 10/27/2020 with cough, shortness of breath, and generalized weakness. She was admitted for pneumonia.  She developed septic shock and hypotensions and was supsequently admitted to the ICU for CRRT given hemodynamic instability. She had a brief PEA Arrest on 7/3 and presumed MI/STEMI on 7/8. She is now intubated, on pressors, CRRT. Was able to titrate off pressors except vasopressin, but due to hypotension is now back on neosynephrine.  She has also lost left radial pulse due to radial artery occlusion and her fingers are dusky and mottled. She has not been a candidate for surgical intervention with [facility] due to the significance of her illnesses. Thus far she has remained a full code.  UPDATE: This afternoon [facility] took her to the OR for a left radial thrombectomy.  Past Medical History:  Diagnosis Date   Anemia    Arthritis    Asthma    in the past   CHF (congestive heart failure) ([place])    Diabetes mellitus with complication ([place])    Type 2   End stage renal disease on dialysis ([place])    Gangrene ([place])    Hyperlipidemia    Hypertension     Social History   Socioeconomic History   Marital status: Widowed    Spouse name: Not on file   Number of children: Not on file   Years of education: Not on file   Highest education level: Not on file  Occupational History   Not on file  Tobacco Use   Smoking status: Former    Pack years: 0.00    Types: Cigarettes    Quit  date: 07/25/1984    Years since quitting: 36.3   Smokeless tobacco: Never  Vaping Use   Vaping Use: Never used  Substance and Sexual Activity   Alcohol use: No    Alcohol/week: 0.0 standard drinks   Drug use: No   Sexual activity: Not on file  Other Topics Concern   Not on file  Social History Narrative   Not on file   Social Determinants of Health   Financial Resource Strain: Low Risk    Difficulty of Paying Living Expenses: Not very hard  Food Insecurity: No Food Insecurity   Worried About Charity fundraiser in the Last Year: Never true   [place] in the Last Year: Never true  Transportation Needs: No Transportation Needs   Lack of Transportation (Medical): No   Lack of Transportation (Non-Medical): No  Physical Activity: Inactive   Days of Exercise per Week: 0 days   Minutes of Exercise per Session: 0 min  Stress: No Stress Concern Present   Feeling of Stress : Only a little  Social Connections: Socially Isolated   Frequency of Communication with Friends and Family: More than three times a week   Frequency of Social Gatherings with Friends and Family:  More than three times a week   Attends Religious Services: Never   Active Member of Clubs or Organizations: No   Attends Archivist Meetings: Never   Marital Status: Widowed    Family History  Problem Relation Age of Onset   Diabetes Mother    Hypertension Mother    Cancer Father    Diabetes Father    Hypertension Sister    Diabetes Daughter    Heart disease Daughter    Hypertension Daughter    Diabetes Son    Heart disease Son    Hypertension Son    Peripheral vascular disease Son        amputation    Subjective:   This NP Walden Field reviewed medical records, received report from team, assessed the patient and then meet at the patient's bedside  to discuss diagnosis, prognosis, GOC, EOL wishes disposition and options.   Concept of Palliative Care was introduced as specialized medical care  for people and their families living with serious illness.  If focuses on providing relief from the symptoms and stress of a serious illness.  The goal is to improve quality of life for both the patient and the family. Values and goals of care important to patient and family were attempted to be elicited.  Created space and opportunity for patient  and family to explore thoughts and feelings regarding current medical situation   Natural trajectory and expectations at EOL were discussed. Questions and concerns addressed. Patient  encouraged to call with questions or concerns.    Life Review: To be conducted at family meeting tomorrow  Patient Values: To be elicited at family meeting tomorrow  Patient/Family Understanding of Illness: To be elicited at family meeting tomorrow  Review of Systems  Unable to perform ROS: Intubated   Objective:   Primary Diagnoses: Present on Admission:  CAP (community acquired pneumonia)  Altered mental status  Acute respiratory failure with hypoxia (HCC)  Lactic acidosis  Hypoalbuminemia  Leucocytosis  Thrombocytosis  Hyponatremia  Hyperglycemia due to diabetes mellitus (HCC)  Anemia of chronic disease  Essential (primary) hypertension  PVD (peripheral vascular disease)/ S/p BKA  Hyperlipidemia   Scheduled Meds:  aspirin  81 mg Per Tube Daily   atorvastatin  40 mg Per Tube Daily   B-complex with vitamin C  1 tablet Per Tube Daily   chlorhexidine gluconate (MEDLINE KIT)  15 mL Mouth Rinse BID   Chlorhexidine Gluconate Cloth  6 each Topical Q0600   clopidogrel  75 mg Per Tube Daily   darbepoetin (ARANESP) injection - DIALYSIS  100 mcg Intravenous Q Tue-HD   docusate  100 mg Per Tube Daily   feeding supplement (PROSource TF)  45 mL Per Tube QID   fentaNYL (SUBLIMAZE) injection  100 mcg Intravenous Once   fentaNYL (SUBLIMAZE) injection  50 mcg Intravenous Once   guaiFENesin  15 mL Per Tube Q6H   insulin aspart  3-9 Units Subcutaneous Q4H    insulin aspart  4 Units Subcutaneous Q4H   insulin glargine  50 Units Subcutaneous Q12H   ipratropium-albuterol  3 mL Nebulization TID   mouth rinse  15 mL Mouth Rinse 10 times per day   pantoprazole sodium  40 mg Per Tube Daily   polyethylene glycol  17 g Per Tube Daily   sodium chloride flush  10-40 mL Intracatheter Q12H    Continuous Infusions:   prismasol BGK 4/2.5 500 mL/hr at 11/04/20 0942    prismasol BGK 4/2.5 200 mL/hr at  11/03/20 2036   sodium chloride 5 mL/hr at 11/04/20 0700   amiodarone 30 mg/hr (11/04/20 0823)   feeding supplement (VITAL 1.5 CAL) 1,000 mL (11/04/20 0830)   fentaNYL infusion INTRAVENOUS 175 mcg/hr (11/04/20 0700)   heparin 1,050 Units/hr (11/04/20 0700)   meropenem (MERREM) IV Stopped (11/04/20 3300)   norepinephrine (LEVOPHED) Adult infusion Stopped (11/03/20 7622)   phenylephrine (NEO-SYNEPHRINE) Adult infusion Stopped (11/04/20 0612)   prismasol BGK 2/2.5 dialysis solution 1,800 mL/hr at 11/04/20 0943   propofol (DIPRIVAN) infusion Stopped (11/02/20 1515)   vancomycin Stopped (11/03/20 2314)   vasopressin 0.03 Units/min (11/04/20 0700)    PRN Meds: sodium chloride, acetaminophen (TYLENOL) oral liquid 160 mg/5 mL, albuterol, alteplase, bisacodyl, heparin, hydrALAZINE, metoprolol tartrate, midazolam, sodium chloride, sodium chloride flush  Allergies  Allergen Reactions   Sulfa Antibiotics Swelling    Physical Exam Vitals and nursing note reviewed.  Constitutional:      General: She is not in acute distress.    Appearance: She is ill-appearing and toxic-appearing.     Interventions: She is intubated.  HENT:     Head: Normocephalic and atraumatic.     Mouth/Throat:     Mouth: Mucous membranes are moist.  Cardiovascular:     Rate and Rhythm: Tachycardia present. Rhythm irregular.     Heart sounds: No murmur heard.    Comments: Fluctuating rhythm including sinus and afib Pulmonary:     Effort: No respiratory distress. She is intubated.      Breath sounds: Normal breath sounds. No wheezing, rhonchi or rales.  Abdominal:     General: There is no distension.     Palpations: Abdomen is soft.  Skin:    General: Skin is warm and dry.     Comments: Left hand with cool, purple, dusky fingers  Neurological:     Comments: Sedated but follows simple commands    Vital Signs:  BP (!) 141/46   Pulse 82   Temp (!) 97.3 F (36.3 C) (Esophageal)   Resp (!) 24   Ht 5' 4"  (1.626 m)   Wt 85.3 kg   LMP  (LMP Unknown)   SpO2 100%   BMI 32.28 kg/m  Pain Scale: CPOT   Pain Score: 0-No pain  SpO2: SpO2: 100 % O2 Device:SpO2: 100 % O2 Flow Rate: .O2 Flow Rate (L/min): 15 L/min  IO: Intake/output summary:  Intake/Output Summary (Last 24 hours) at 11/04/2020 0955 Last data filed at 11/04/2020 0900 Gross per 24 hour  Intake 3920.33 ml  Output 4201 ml  Net -280.67 ml    LBM: Last BM Date: 10/28/20 Baseline Weight: Weight: 94 kg Most recent weight: Weight: 85.3 kg      Palliative Assessment/Data: 10%   Advanced Care Planning:   Primary Decision Maker: NEXT OF KIN  Code Status/Advance Care Planning: Full code  The patient is intubated and follows simple commands, but is unable to communicate.  I called and spoke with her daughter Christy Zhang.  We have set up a family meeting for tomorrow at 1:00 PM and the 2H consult room.  She states that they do not want to see her mom in this condition and request to not meet in the room.  A discussion will be had tomorrow regarding advanced directives. Concepts specific to code status, artifical feeding and hydration, continued IV antibiotics and rehospitalization was had.  The difference between a aggressive medical intervention path and a palliative comfort care path for this patient at this time was had.   Decisions/Changes  to ACP: None at this time  Assessment & Plan:   I have reviewed the medical record, interviewed the patient and family, and examined the patient. The  following aspects are pertinent.  Impression: Very sick 65 year old female with multiple chronic comorbidities as described above now in the ICU status post septic shock and/or mixed shock on pressors, end-stage renal disease on CRRT, status post likely acute MI, radial artery occlusion going to the OR for thrombectomy.  There has not been an opportunity to discuss goals of care with the patient's daughters.  I am concerned about her ability to survive her current acute situation given her chronic disease burden.  SUMMARY OF RECOMMENDATIONS   Meet with daughters tomorrow for family meeting and set goals of care Continue full scope of care until any changes in goals are decided Continued PMT support  Symptom Management:  Per attending We are available to help with symptom management as needed  Palliative Prophylaxis:  Bowel Regimen, Frequent Pain Assessment, and Oral Care  Additional Recommendations (Limitations, Scope, Preferences): Full Scope Treatment  Psycho-social/Spiritual:  Additional Recommendations: Caregiving  Support/Resources  Prognosis:  Unable to determine  Discharge Planning:  To Be Determined   Discussed with:  Anderson Malta (RN) and Dr. Ruthann Cancer    Thank you for allowing [place] to participate in the care of [place] PMT will continue to support holistically.  Time In: 9:00 Time Out: 12:30 Time Total: 90 min  Greater than 50%  of this time was spent counseling and coordinating care related to the above assessment and plan.  Signed by: Walden Field, NP Palliative Medicine Team  Team Phone # [phone] (Nights/Weekends)  11/04/2020, 9:55 AM

## 2020-11-04 NOTE — Transfer of Care (Signed)
Immediate Anesthesia Transfer of Care Note  Patient: Christy Zhang  Procedure(s) Performed: THROMBECTOMY RADIAL ARTERY with Bovine Patching. (Left: Arm Lower)  Patient Location: ICU  Anesthesia Type:General  Level of Consciousness: sedated, unresponsive and Patient remains intubated per anesthesia plan  Airway & Oxygen Therapy: Patient remains intubated per anesthesia plan and Patient placed on Ventilator (see vital sign flow sheet for setting)  Post-op Assessment: Report given to RN and Post -op Vital signs reviewed and stable  Post vital signs: Reviewed and stable  Last Vitals:  Vitals Value Taken Time  BP    Temp    Pulse 79 11/04/20 1550  Resp 24 11/04/20 1550  SpO2 93 % 11/04/20 1550    Last Pain:  Vitals:   11/04/20 1345  TempSrc: Esophageal  PainSc:       Patients Stated Pain Goal: 0 ([id number])  Complications: No notable events documented.

## 2020-11-05 DIAGNOSIS — N186 End stage renal disease: Secondary | ICD-10-CM | POA: Diagnosis not present

## 2020-11-05 DIAGNOSIS — I471 Supraventricular tachycardia: Secondary | ICD-10-CM | POA: Diagnosis not present

## 2020-11-05 DIAGNOSIS — J189 Pneumonia, unspecified organism: Secondary | ICD-10-CM | POA: Diagnosis not present

## 2020-11-05 DIAGNOSIS — I739 Peripheral vascular disease, unspecified: Secondary | ICD-10-CM | POA: Diagnosis not present

## 2020-11-05 DIAGNOSIS — I469 Cardiac arrest, cause unspecified: Secondary | ICD-10-CM | POA: Diagnosis not present

## 2020-11-05 DIAGNOSIS — R4182 Altered mental status, unspecified: Secondary | ICD-10-CM | POA: Diagnosis not present

## 2020-11-05 LAB — TYPE AND SCREEN
ABO/RH(D): B POS
Antibody Screen: NEGATIVE
Unit division: 0
Unit division: 0

## 2020-11-05 LAB — RENAL FUNCTION PANEL
Albumin: 2.2 g/dL — ABNORMAL LOW (ref 3.5–5.0)
Albumin: 2.2 g/dL — ABNORMAL LOW (ref 3.5–5.0)
Anion gap: 9 (ref 5–15)
Anion gap: 10 (ref 5–15)
BUN: 19 mg/dL (ref 8–23)
BUN: 18 mg/dL (ref 8–23)
CO2: 24 mmol/L (ref 22–32)
CO2: 25 mmol/L (ref 22–32)
Calcium: 8.5 mg/dL — ABNORMAL LOW (ref 8.9–10.3)
Calcium: 8.6 mg/dL — ABNORMAL LOW (ref 8.9–10.3)
Chloride: 101 mmol/L (ref 98–111)
Chloride: 100 mmol/L (ref 98–111)
Creatinine, Ser: 1.39 mg/dL — ABNORMAL HIGH (ref 0.44–1.00)
Creatinine, Ser: 1.37 mg/dL — ABNORMAL HIGH (ref 0.44–1.00)
GFR, Estimated: 43 mL/min — ABNORMAL LOW (ref >60)
GFR, Estimated: 43 mL/min — ABNORMAL LOW (ref >60)
Glucose, Bld: 142 mg/dL — ABNORMAL HIGH (ref 70–99)
Glucose, Bld: 129 mg/dL — ABNORMAL HIGH (ref 70–99)
Phosphorus: 3.1 mg/dL (ref 2.5–4.6)
Phosphorus: 2.4 mg/dL — ABNORMAL LOW (ref 2.5–4.6)
Potassium: 3.8 mmol/L (ref 3.5–5.1)
Potassium: 3.7 mmol/L (ref 3.5–5.1)
Sodium: 134 mmol/L — ABNORMAL LOW (ref 135–145)
Sodium: 135 mmol/L (ref 135–145)

## 2020-11-05 LAB — CBC
HCT: 30.6 % — ABNORMAL LOW (ref 36.0–46.0)
Hemoglobin: 9.5 g/dL — ABNORMAL LOW (ref 12.0–15.0)
MCH: 24.4 pg — ABNORMAL LOW (ref 26.0–34.0)
MCHC: 31 g/dL (ref 30.0–36.0)
MCV: 78.5 fL — ABNORMAL LOW (ref 80.0–100.0)
Platelets: 331 10*3/uL (ref 150–400)
RBC: 3.9 MIL/uL (ref 3.87–5.11)
RDW: 23.3 % — ABNORMAL HIGH (ref 11.5–15.5)
WBC: 22.7 10*3/uL — ABNORMAL HIGH (ref 4.0–10.5)
nRBC: 0.2 % (ref 0.0–0.2)

## 2020-11-05 LAB — BPAM RBC
Blood Product Expiration Date: 202208032359
Blood Product Expiration Date: 202208042359
ISSUE DATE / TIME: 202207091414
ISSUE DATE / TIME: 202207091414
Unit Type and Rh: 7300
Unit Type and Rh: 7300

## 2020-11-05 LAB — MAGNESIUM: Magnesium: 2.5 mg/dL — ABNORMAL HIGH (ref 1.7–2.4)

## 2020-11-05 LAB — GLUCOSE, CAPILLARY
Glucose-Capillary: 109 mg/dL — ABNORMAL HIGH (ref 70–99)
Glucose-Capillary: 130 mg/dL — ABNORMAL HIGH (ref 70–99)
Glucose-Capillary: 132 mg/dL — ABNORMAL HIGH (ref 70–99)
Glucose-Capillary: 133 mg/dL — ABNORMAL HIGH (ref 70–99)
Glucose-Capillary: 157 mg/dL — ABNORMAL HIGH (ref 70–99)
Glucose-Capillary: 162 mg/dL — ABNORMAL HIGH (ref 70–99)
Glucose-Capillary: 182 mg/dL — ABNORMAL HIGH (ref 70–99)

## 2020-11-05 LAB — HEPARIN LEVEL (UNFRACTIONATED): Heparin Unfractionated: 0.32 IU/mL (ref 0.30–0.70)

## 2020-11-05 MED ORDER — NOREPINEPHRINE 16 MG/250ML-% IV SOLN
0.0000 ug/min | INTRAVENOUS | Status: DC
  Administered 2020-11-05: 2 ug/min via INTRAVENOUS
  Filled 2020-11-05: qty 250

## 2020-11-05 MED ORDER — NOREPINEPHRINE 4 MG/250ML-% IV SOLN
INTRAVENOUS | Status: AC
  Administered 2020-11-05: 4 mg
  Filled 2020-11-05: qty 250

## 2020-11-05 MED ORDER — NOREPINEPHRINE 4 MG/250ML-% IV SOLN: 0.0000 ug/min | INTRAVENOUS | Status: DC

## 2020-11-05 MED ORDER — AMIODARONE HCL 200 MG PO TABS
400.0000 mg | ORAL_TABLET | Freq: Three times a day (TID) | ORAL | Status: DC
  Administered 2020-11-05 – 2020-11-06 (×3): 400 mg via ORAL
  Filled 2020-11-05 (×3): qty 2

## 2020-11-05 MED ORDER — PHENYLEPHRINE HCL-NACL 10-0.9 MG/250ML-% IV SOLN
INTRAVENOUS | Status: AC
  Filled 2020-11-05: qty 250

## 2020-11-05 MED ORDER — PHENYLEPHRINE HCL-NACL 10-0.9 MG/250ML-% IV SOLN
0.0000 ug/min | INTRAVENOUS | Status: DC
  Administered 2020-11-05 – 2020-11-06 (×4): 40 ug/min via INTRAVENOUS
  Administered 2020-11-06: 50 ug/min via INTRAVENOUS
  Administered 2020-11-06: 40 ug/min via INTRAVENOUS
  Filled 2020-11-05 (×6): qty 250

## 2020-11-05 NOTE — Progress Notes (Addendum)
Daily Progress Note   Patient Name: Christy Zhang       Date: 11/05/2020 DOB: [birth year]  Age: 65 y.o. MRN#: [id number] Attending Physician: Audria Nine, DO Primary Care Physician: Claretta Fraise, MD Admit Date: 10/27/2020 Length of Stay: 9 days  Reason for Consultation/Follow-up: Establishing goals of care  HPI/Patient Profile:  65 y.o. female  with past medical history of HTN, HLD, peripheral vascular disease status post bilateral BKA, DM, anemia, ESRD on HD MWF at [facility] admitted on 10/27/2020 with cough, shortness of breath, and generalized weakness. She was admitted for pneumonia.  She developed septic shock and hypotensions and was supsequently admitted to the ICU for CRRT given hemodynamic instability. She had a brief PEA Arrest on 7/3 and presumed MI/STEMI on 7/8. She is now intubated, on pressors, CRRT. Was able to titrate off pressors except vasopressin, but due to hypotension is now back on neosynephrine.   She had also lost left radial pulse due to radial artery occlusion and her fingers are dusky and mottled. Went to OR with [facility] who repaired the artery. 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 NGT was placed yesterday. Plan for stopping CRRT (when filter clots/expires) for bedside HD given improved hemodynamics.  Thus far she has remained a full code. Family meeting scheduled for 7/10 at 1:00 pm  Current Medications: Scheduled Meds:   amiodarone  400 mg Oral TID   aspirin  81 mg Per Tube Daily   atorvastatin  40 mg Per Tube Daily   B-complex with vitamin C  1 tablet Per Tube Daily   chlorhexidine gluconate (MEDLINE KIT)  15 mL Mouth Rinse BID   Chlorhexidine Gluconate Cloth  6 each Topical Q0600   clopidogrel  75 mg Per Tube Daily   darbepoetin (ARANESP) injection - DIALYSIS  100 mcg Intravenous Q Tue-HD   docusate  100 mg Per Tube Daily   feeding supplement (PROSource TF)  45 mL Per Tube QID   fentaNYL (SUBLIMAZE) injection  100 mcg  Intravenous Once   fentaNYL (SUBLIMAZE) injection  50 mcg Intravenous Once   guaiFENesin  15 mL Per Tube Q6H   insulin aspart  3-9 Units Subcutaneous Q4H   insulin aspart  4 Units Subcutaneous Q4H   insulin glargine  55 Units Subcutaneous Q12H   ipratropium-albuterol  3 mL Nebulization TID   mouth rinse  15 mL Mouth Rinse 10 times per day   pantoprazole sodium  40 mg Per Tube Daily   polyethylene glycol  17 g Per Tube Daily   sodium chloride flush  10-40 mL Intracatheter Q12H    Continuous Infusions:   prismasol BGK 4/2.5 500 mL/hr at 11/05/20 0842    prismasol BGK 4/2.5 200 mL/hr at 11/03/20 2036   sodium chloride 10 mL/hr at 11/05/20 0700   feeding supplement (VITAL 1.5 CAL) 1,000 mL (11/04/20 1600)   fentaNYL infusion INTRAVENOUS 175 mcg/hr (11/05/20 1020)   heparin 1,050 Units/hr (11/05/20 0700)   meropenem (MERREM) IV Stopped (11/05/20 5732)   prismasol BGK 2/2.5 dialysis solution 1,800 mL/hr at 11/05/20 0827   vancomycin Stopped (11/04/20 2345)    PRN Meds: sodium chloride, acetaminophen (TYLENOL) oral liquid 160 mg/5 mL, albuterol, alteplase, bisacodyl, heparin, hydrALAZINE, metoprolol tartrate, midazolam, sodium chloride, sodium chloride flush  Subjective:   Subjective: Chart Reviewed. Updates received. Patient Assessed. Created space and opportunity for patient  and family to explore thoughts and feelings regarding current medical situation.  Today's Discussion: Per bedside nurse, the patient  is doing better. Was off all pressors however back on low dose vasopressin for now. However, much less hemodynamic fluctuations. She is awake and will follow simple commands. Overall she feels that the patient is doing better clinically.  I met with the patient's daughters Christy Zhang and Christy Zhang in the consult room. [name] did go and see her mom, Christy Zhang doesn't want to see her with all the tubes. Christy Zhang helps her mom at home with personal care as her aide.  Her daughters wanted to share the  story of how she got here.  She last had a BKA in May of this year.  She has been hemodialysis dependent for 6 to 7 years.  At home her diabetes blood sugars have been well controlled, she is insulin-dependent.  Last Thursday she was having shortness of breath for which EMS was called and they found her vitals were stable but her blood sugars were elevated at 207.  They gave her nebulizer treatment and she did fine.  Next day, on Friday, at hemodialysis she had low blood pressure but she told the nurse that she felt fine so they did not take any further measures.  When her daughter picked her up from hemodialysis she was not acting right and she was wondering if her blood sugars were low.  They went to the vein and vascular follow-up appointment wherein she continued to not act right such as falling asleep in the waiting room, speaking very loudly.  Vein and vascular providers discussed possible need for AKA due to poor blood flow to the end of the stump on the recent BKA.  They agreed to a 3-week follow-up appointment to reassess to allow the patient's daughter to bring her home and try to figure out why she was not acting right.  On the way home she continues to not act right, saying things that she normally would not say, falling asleep in the car.  She had a bowel movement on herself and would not let her daughter/aide clean her up (which is apparently very unusual).  They called EMS who found her blood pressure about 80/60 and her oxygen saturation in the 40% range.  They brought her to the hospital where she was diagnosed with pneumonia.  They called her daughters instead they are sending her to [facility]y Zhang and thus she began her journey at this facility.  Understanding of Patient's Illness: We reviewed her chronic medical history and the fact that she has had multiple comorbidities which likely are contributing to her difficulty in getting well.  They understand she is very sick at this time.  They state  that she is a "very strong woman" and is overcome many things in her past and they are hopeful she will overcome this as well.  We discussed the idea of hoping/praying for the best but the diligence of planning for the worst.  They agreed with this.  Life Review: We completed a life review of the patient to get know her better.  The patient worked outside of the home at the [employer] in the [employer].  She retired in approximately 2011.  She has 2 daughters, who are present for meeting today.  Her husband has already passed away.  She also has 3 grandchildren and 4 great-grandchildren, to whom she is affectionately known as "big nanny".  She also has 4 sisters and 1 brother who constantly are checking on her.  Her mother is still alive, but has dementia and Parkinson's  disease.  In her free time she enjoys completing jigsaw puzzles and word searches.  She does enjoy cooking and has a reputation for her potato salad, candied apples, and banana pudding.  She also really enjoys "slushy's".  Thinks Important to the Patient: Her primary and main thing of importance is her family. She has a loving family and loves spending time with them.  Review of Systems  Unable to perform ROS: Intubated   Objective:   Vital Signs: BP (!) 146/46   Pulse (!) 148   Temp (!) 97 F (36.1 C)   Resp (!) 24   Ht $R'5\' 4"'nB$  (1.626 m)   Wt 86.2 kg   LMP  (LMP Unknown)   SpO2 99%   BMI 32.62 kg/m  SpO2: SpO2: 99 % O2 Device: O2 Device: Ventilator O2 Flow Rate: O2 Flow Rate (L/min): 15 L/min  Physical Exam: Physical Exam Constitutional:      General: She is not in acute distress.    Appearance: She is obese. She is ill-appearing.     Interventions: She is intubated.     Comments: Follows simple commands  HENT:     Head: Normocephalic and atraumatic.  Cardiovascular:     Rate and Rhythm: Tachycardia present.     Heart sounds: Normal heart sounds.  Pulmonary:     Effort: No respiratory  distress. She is intubated.     Breath sounds: Wheezing (mild end-expiratory wheezes) present.  Abdominal:     Palpations: Abdomen is soft.  Neurological:     Mental Status: She is easily aroused.    SpO2: SpO2: 99 % O2 Device:SpO2: 99 % O2 Flow Rate: .O2 Flow Rate (L/min): 15 L/min  IO: Intake/output summary:  Intake/Output Summary (Last 24 hours) at 11/05/2020 1033 Last data filed at 11/05/2020 0700 Gross per 24 hour  Intake 3518.03 ml  Output 2051 ml  Net 1467.03 ml    LBM: Last BM Date:  (pta) Baseline Weight: Weight: 94 kg Most recent weight: Weight: 86.2 kg   Palliative Assessment/Data: 30%   Assessment & Plan:   Impression: Very sick 65 year old female with multiple comorbidities now with likely pneumonia sepsis, incident of myocardial ischemia or MI, occluded left radial artery status postsurgical repair, remains on the ventilator, hemodynamic instability requiring on and off pressors, unstable for hemodialysis and has since been on CRRT.  Today she seems to be making some strides and able to come off pressors, although she was started back on Levophed at a low to moderate dose this afternoon.  She appears to be doing well with her vent weaning, but not able to be extubated as of yet.  Per nephrology plans are to stop CRRT and transition to bedside hemodialysis when the current filter expires or clots.  Tenuous situation with uncertain prognosis.  Watchful waiting will be key. We made decisions on CODE STATUS.  We agreed on the importance of continued and ongoing conversations for further decisions, as needed.  SUMMARY OF RECOMMENDATIONS   Patient was made a limited code as per below Continue discussion on further goals of care decisions Hope/pray for the best plan for the worst Continue to treat the treatable Continue with even aggressive measures including CRRT or hemodialysis, vent support, Pressors Continued PMT support  Code Status: Limited code: Keep  ventilator, no CPR/shocks/meds.  Goals of Care/Recommendations: Limited code: keep vent, no CPR/shocks/Meds Further discussions as clinically progresses  Symptom Management: No palliative symptom management needs identified at this time We are available as needed  for symptom management  Prognosis: Unable to determine  Discharge Planning: To Be Determined  Discussed with: Patient's family, Dr. Ruthann Cancer, Glorianne Manchester (RN)  Thank you for allowing [place] to participate in the care of [place] PMT will continue to support holistically.  Time Total: 90 min  Visit consisted of counseling and education dealing with the complex and emotionally intense issues of symptom management and palliative care in the setting of serious and potentially life-threatening illness. Greater than 50%  of this time was spent counseling and coordinating care related to the above assessment and plan.  Walden Field, NP Palliative Medicine Team  Team Phone # [phone] (Nights/Weekends)  11/05/2020, 10:33 AM

## 2020-11-05 NOTE — Progress Notes (Signed)
NAME:  Christy Zhang, MRN:  [id number], DOB:  [birth year], LOS: 9 ADMISSION DATE:  10/27/2020, CONSULTATION DATE:  7/3 REFERRING MD:  Tyrell Antonio, CHIEF COMPLAINT:  dyspnea   Brief History of Present Illness:  65 y/o female admitted for presumed pneumonia and hypotension, required intubation, mechanical ventilation and CRRT in the setting of septic shock from pneumonia cauting acute respiratory failure with hypoxemia and AKI.  Pertinent  Medical History  CHF CKD T2DM ESRD HLD HTN  Significant Hospital Events: Including procedures, antibiotic start and stop dates in addition to other pertinent events   Admitted to hospital on 7/1 7/1 cefepime >  7/1 flagyl x1 7/1 vancomycin>  Admitted to ICU on 10/29/20 for CRRT 7/3 - brief PEA with bearing down while agitated. 7/3 ETT 7/3 left radial arterial line 7/3 L IJ HD cath 7/5 L pigtail CT placed for L pleural effusion 7/6 CT output -550 cc 7/8 A fib, ST elevation noted per EKG, Concern for ischemia, cards consulted  7/8>> [place] LUE >Occluded radial artery from the mid to distal segment. Ulnar artery patent  with calcification through the mid to distal segments. Appearance of a  non-obstructive lesion in the distal brachial artery.  7/7 >> CT Chest Abdomen  IMPRESSION: 3.5 cm heterogeneously enhancing abnormality is again noted arising from lower pole of left kidney concerning for possible neoplasm. Further evaluation with MRI with and without gadolinium is recommended.   Endotracheal tube is directed slightly into right mainstem bronchus; withdrawal by 2-3 cm is recommended.   Small right pleural effusion is noted with adjacent subsegmental atelectasis of right lower lobe.   Pigtail catheter is noted in left hemithorax. No pneumothorax is noted.   Mild gallbladder distention is now noted. No cholelithiasis or inflammatory changes noted.   Interim History / Subjective:  7/10: awake and able to follow some commands.  L hand  improving (3 digits with dry necrotic changes). Remains on crrt with intention to stop when cartridge expires. Tolerated some cpap on escalated settings but ended with increased rr.  7/9: palliative care consulted yesterday after stemi and worsening L hand ischemia 2/2 radial a. Occlusion. New changes on 5th digit as well today.    Objective   Blood pressure (!) 146/46, pulse (!) 148, temperature (!) 97 F (36.1 C), resp. rate (!) 24, height '5\' 4"'$  (1.626 m), weight 86.2 kg, SpO2 99 %.    Vent Mode: PRVC FiO2 (%):  [40 %-60 %] 40 % Set Rate:  [24 bmp] 24 bmp Vt Set:  [440 mL] 440 mL PEEP:  [5 cmH20] 5 cmH20 Pressure Support:  [14 cmH20] 14 cmH20 Plateau Pressure:  [19 cmH20-22 cmH20] 19 cmH20   Intake/Output Summary (Last 24 hours) at 11/05/2020 0933 Last data filed at 11/05/2020 0700 Gross per 24 hour  Intake 3629.04 ml  Output 2205 ml  Net 1424.04 ml   Filed Weights   11/03/20 0500 11/04/20 0500 11/05/20 0615  Weight: 86.7 kg 85.3 kg 86.2 kg    Examination: General:  critcally ill apearing on mech vent, awake and following some simple commands.  HEENT: MM pink/moist; ETT in place, HD cath per [facility] Neuro: sedated, per nursing follows some simple commands CV: irreg irreg S1, S2,  PULM:  Bilateral chest excursion , clear upper lobes, diminished per bases, Left CT removed  GI: soft, bsx4 active , No BM several days Extremities: warm/dry, bilateral BKA, Left hand with dark demarkation thumb and pinky finger. All other fingers, warm with improved color Skin: no  rashes or lesions,   7/1 blood culture > negative 7/1 urine culture > multiple species 7/2 SARS cov 2/flu > neg 7/7 trach culture >neg 7/7 blood cx: ngtd  CXR:    Resolved Hospital Problem list     Assessment & Plan:  Acute respiratory failure with hypercarbia and hypoxemia due to severe community acquired pneumonia Full mechanical vent support L pleural effusion: Lights criteria 98% positive; pleural cultures sent  7/6 P: - continue mech vent support: PRVC - saturation goals are > 94% -daily SBTs/SAT once cleared by cards -VAP prevention -follow pleural cultures: neg -follow trach cultures: neg -continue cefepime -CXR prn  Septic shock due to CAP P: - titrate pressors for MAP >65, cuyrrently held - CT Abdomen, no obvious source of infection -continue merrem -trend fever/wbc curve (tmax 97.9) wbc improving to 23K -Vascular evaluated BKA wound and believes likely not the source of infection -L hand improved after thrombectomy  Need for sedation for mechanical ventilation P: -continue sedation for RASS goal -2 -PAD protocol  Constipation P: - continue bowel regimen today - Dulcolax suppository daily prn  ESRD P: -Nephro following -continue CRRT w/ volume removal as able esp in light of ability to wean pressors to single agent at this time.  -trend bmp and uop  PAD/ PVD Occlusion of radial artery, ulnar artery with calcifications, distal brachial with non- obstructive lesion Per vascular surgery, no intervention due to critical illness at present time  P: -continue statin and plavix - reumed heparin - Wean pressors  - Appreciate vascular surgery assist, s/p thrombectomy - improved exam - No sticks L arm, no lines L arm  Atrial filbrillation New STEMI 7/8 >> not candidate for cath.  P: - continuous telemetry - continue IV heparin and PO am io - cont titration of pressors as needed - Repeat echo 7/8 post stemi with stable ef - Continue aspirin, Plavix, statin and heparin - EKG prn - Maintain mag > 2, K > 4  DM2 with hyperglycemia Insulin gtt off P: -improved glycemic control - basal insulin at 55 favor holding tf coverage if begins to decrease again.  -CBG monitoring -continue tube feeds  Goals of care:  -palliative care consulted   Best Practice (right click and "Reselect all SmartList Selections" daily)   Diet/type: tubefeeds DVT prophylaxis: systemic  heparin GI prophylaxis: PPI Lines: yes and it is still needed Foley:  N/A Code Status:  full code Last date of multidisciplinary goals of care discussion [7/6 updated daughter by phone, family meeting today 7/10 with palliative]  Labs   CBC: Recent Labs  Lab 11/01/20 0308 11/02/20 0341 11/02/20 0741 11/03/20 0401 11/04/20 0401 11/05/20 0426  WBC 27.1* 29.4*  --  28.2* 23.8* 22.7*  NEUTROABS 22.8*  --   --   --   --   --   HGB 8.1* 8.9* 10.9* 7.7* 7.6* 9.5*  HCT 27.8* 29.9* 32.0* 26.1* 25.4* 30.6*  MCV 82.5 82.1  --  81.1 81.9 78.5*  PLT 449* 423*  --  377 375 [id number]    Basic Metabolic Panel: Recent Labs  Lab 11/01/20 0308 11/01/20 1600 11/02/20 0341 11/02/20 0741 11/03/20 0401 11/03/20 1545 11/04/20 0401 11/04/20 1731 11/05/20 0426  NA 134*   < > 131*   < > 134* 132* 134* 135 134*  K 4.4   < > 5.2*   < > 5.0 4.6 4.1 3.8 3.8  CL 102   < > 98   < > 102 99 100 101 101  CO2 23   < > 22   < > '23 24 25 23 24  '$ GLUCOSE 238*   < > 306*   < > 200* 252* 175* 209* 142*  BUN 19   < > 21   < > '22 20 19 23 19  '$ CREATININE 1.75*   < > 1.65*   < > 1.68* 1.51* 1.49* 1.68* 1.39*  CALCIUM 8.1*   < > 8.8*   < > 8.7* 8.3* 8.4* 9.0 8.5*  MG 2.3  --  2.7*  --  2.5*  --  2.6*  --  2.5*  PHOS 2.6   < > 3.6   < > 3.3 3.0 3.3 4.6 3.1   < > = values in this interval not displayed.   GFR: Estimated Creatinine Clearance: 44 mL/min (A) (by C-G formula based on SCr of 1.39 mg/dL (H)). Recent Labs  Lab 11/02/20 0341 11/03/20 0401 11/04/20 0401 11/05/20 0426  WBC 29.4* 28.2* 23.8* 22.7*    Liver Function Tests: Recent Labs  Lab 10/31/20 2220 11/01/20 0308 11/01/20 1600 11/02/20 0341 11/02/20 1638 11/03/20 0401 11/03/20 1545 11/04/20 0401 11/04/20 1731 11/05/20 0426  AST  --  109*  --  74*  --  87*  84*  --   --   --   --   ALT  --  142*  --  117*  --  77*  75*  --   --   --   --   ALKPHOS  --  79  --  93  --  85  84  --   --   --   --   BILITOT  --  0.8  --  0.8  --  0.8  1.1   --   --   --   --   PROT 6.5 6.5  --  7.7  --  7.4  7.1  --   --   --   --   ALBUMIN  --  2.2*   < > 2.4*   < > 2.1*  2.1* 2.1* 2.1* 2.3* 2.2*   < > = values in this interval not displayed.   No results for input(s): LIPASE, AMYLASE in the last 168 hours. No results for input(s): AMMONIA in the last 168 hours.  ABG    Component Value Date/Time   PHART 7.401 11/02/2020 0741   PCO2ART 41.2 11/02/2020 0741   PO2ART 135 (H) 11/02/2020 0741   HCO3 25.8 11/02/2020 0741   TCO2 27 11/02/2020 0741   ACIDBASEDEF 4.0 (H) 10/29/2020 1630   O2SAT 99.0 11/02/2020 0741      Coagulation Profile: No results for input(s): INR, PROTIME in the last 168 hours.   Cardiac Enzymes: No results for input(s): CKTOTAL, CKMB, CKMBINDEX, TROPONINI in the last 168 hours.  HbA1C: HB A1C (BAYER DCA - WAIVED)  Date/Time Value Ref Range Status  04/06/2020 11:05 AM 5.4 <7.0 % Final    Comment:                                          Diabetic Adult            <7.0                                       Healthy  Adult        4.3 - 5.7                                                           (DCCT/NGSP) American Diabetes Association's Summary of Glycemic Recommendations for Adults with Diabetes: Hemoglobin A1c <7.0%. More stringent glycemic goals (A1c <6.0%) may further reduce complications at the cost of increased risk of hypoglycemia.   01/06/2020 10:13 AM 6.2 <7.0 % Final    Comment:                                          Diabetic Adult            <7.0                                       Healthy Adult        4.3 - 5.7                                                           (DCCT/NGSP) American Diabetes Association's Summary of Glycemic Recommendations for Adults with Diabetes: Hemoglobin A1c <7.0%. More stringent glycemic goals (A1c <6.0%) may further reduce complications at the cost of increased risk of hypoglycemia.    Hgb A1c MFr Bld  Date/Time Value Ref Range Status  10/27/2020 06:48  PM 6.4 (H) 4.8 - 5.6 % Final    Comment:    (NOTE)         Prediabetes: 5.7 - 6.4         Diabetes: >6.4         Glycemic control for adults with diabetes: <7.0   08/09/2020 05:23 AM 6.5 (H) 4.8 - 5.6 % Final    Comment:    (NOTE) Pre diabetes:          5.7%-6.4%  Diabetes:              >6.4%  Glycemic control for   <7.0% adults with diabetes     CBG: Recent Labs  Lab 11/04/20 1143 11/04/20 1301 11/04/20 1557 11/04/20 1952 11/04/20 2353  GLUCAP 217* 228* 176* 140* 117*    Critical care time: The patient is critically ill with multiple organ systems failure and requires high complexity decision making for assessment and support, frequent evaluation and titration of therapies, application of advanced monitoring technologies and extensive interpretation of multiple databases.  Critical care time 37 mins. This represents my time independent of the NPs time taking care of the pt. This is excluding procedures.    Audria Nine DO [facility] 11/05/2020, 9:33 AM See Amion for pager If no response to pager, please call [phone] until 1900 After 1900 please call [facility] [phone]

## 2020-11-05 NOTE — Progress Notes (Signed)
Dr Candiss Norse, nephrology, notified of having to restart Levophed d/t MAP 40-50.  Orders received to continue CRRT since on levophed.

## 2020-11-05 NOTE — Progress Notes (Signed)
Pt placed back on full vent support due to HR increasing into the 140's. RN aware. RT to continue to monitor.

## 2020-11-05 NOTE — Plan of Care (Signed)
  Problem: Clinical Measurements: Goal: Ability to maintain clinical measurements within normal limits will improve Outcome: Progressing Note: Weaned off vasopressors today, minimal sedation needed, perfusion improved after vascular surgery today Goal: Will remain free from infection Outcome: Progressing Goal: Diagnostic test results will improve Outcome: Progressing Goal: Respiratory complications will improve Outcome: Not Progressing Goal: Cardiovascular complication will be avoided Outcome: Not Progressing Note: HR and rhythm remains quite variable   Problem: Activity: Goal: Risk for activity intolerance will decrease Outcome: Not Progressing Note: BP drops with large position changes   Problem: Nutrition: Goal: Adequate nutrition will be maintained Outcome: Progressing   Problem: Coping: Goal: Level of anxiety will decrease Outcome: Progressing   Problem: Elimination: Goal: Will not experience complications related to bowel motility Outcome: Not Progressing Note: Bowel regimen in place, overdue to have BM Goal: Will not experience complications related to urinary retention Outcome: Not Applicable   Problem: Pain Managment: Goal: General experience of comfort will improve Outcome: Progressing   Problem: Safety: Goal: Ability to remain free from injury will improve Outcome: Progressing   Problem: Skin Integrity: Goal: Risk for impaired skin integrity will decrease Outcome: Progressing Note: Intervention in place   Problem: Activity: Goal: Ability to tolerate increased activity will improve Outcome: Progressing   Problem: Respiratory: Goal: Ability to maintain a clear airway and adequate ventilation will improve Outcome: Not Progressing

## 2020-11-05 NOTE — Progress Notes (Addendum)
[facility] NEPHROLOGY PROGRESS NOTE  Assessment/ Plan: Pt is a 65 y.o. yo female  with history of HTN, HLD, peripheral vascular disease status post bilateral BKA, DM, anemia, ESRD on HD MWF at [facility] presented with cough, shortness of breath and generalized weakness, seen as a consultation for the management of dialysis.  Dialysis Orders from previous admission:  [facility] - MWF 4 hrs 400/500 97 kg 2.0K/2.5 Ca 16g needles -Heparin 1000 units initial bolus-heparin pump 1000 units/hr stop 1 hour prior to end of tx  -Epogen 14000 units IV TIW -Hectorol 1.5 mcg IV TIW  #Septic shock due to left lung pneumonia: Patient was transferred to [facility].  Vent and pressor support per CCM.  Abx per primary service. Ct 7/7 without source of infection -off pressors now  #Thrombosed left radial artery/left hand ischemia -2/2 previous a line and pressors -s/p left radial artery thrombectomy and angio 7/9 w/ VVS  #Possible inferior STEMI -not a candidate for intervention currently. On dapt and hep gtt.  #Vent dependent respiratory failure due to both pneumonia and pulm edema.  Antibiotics as above and volume management with CRRT.  # ESRD MWF schedule.  Now on CRRT because of hemodynamic instability.  C/w CRRT.  On IV heparin for anticoagulation.  Discussed with RN, will try for net neg 50-100cc/hr -Plan is to not restart her CRRT if it clots or if cartridge expires (whichever comes first)  #A. fib with RVR: Currently on amiodarone and heparin.  Cardiology is following.   # Anemia: Hemoglobin 9.5 today.  Continue to monitor.  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  #Hyponatremia, hypervolemic: improved/stable. Managed with CRRT.  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 S/p thrombectomy and angio yesterday. Hand looking better per staff. Off pressors now. Did receive 2u prbc yesterday in the OR. Has been running net even. Now on the hypertensive side. Net pos ~1.2L Objective Vital signs in last 24 hours: Vitals:   11/05/20 0645 11/05/20 0700 11/05/20 0715 11/05/20 0750  BP:      Pulse: 80 83 78   Resp: (!) 24 (!) 25 (!) 24   Temp:  (!) 97 F (36.1 C)    TempSrc:      SpO2: 100% 100% 100% 100%  Weight:      Height:       Weight change: 0.9 kg  Intake/Output Summary (Last 24 hours) at 11/05/2020 0838 Last data filed at 11/05/2020 0700 Gross per 24 hour  Intake 3740.27 ml  Output 2352 ml  Net 1388.27 ml       Labs: Basic Metabolic Panel: Recent Labs  Lab 11/04/20 0401 11/04/20 1731 11/05/20 0426  NA 134* 135 134*  K 4.1 3.8 3.8  CL 100 101 101  CO2 $Re'25 23 24  'PEK$ GLUCOSE 175* 209* 142*  BUN $Re'19 23 19  'Lqh$ CREATININE 1.49* 1.68* 1.39*  CALCIUM 8.4* 9.0 8.5*  PHOS 3.3 4.6 3.1   Liver Function Tests: Recent Labs  Lab 11/01/20 0308 11/01/20 1600 11/02/20 0341 11/02/20 1638 11/03/20 0401 11/03/20 1545 11/04/20 0401 11/04/20 1731 11/05/20 0426  AST 109*  --  74*  --  87*  84*  --   --   --   --  ALT 142*  --  117*  --  77*  75*  --   --   --   --   ALKPHOS 79  --  93  --  85  84  --   --   --   --   BILITOT 0.8  --  0.8  --  0.8  1.1  --   --   --   --   PROT 6.5  --  7.7  --  7.4  7.1  --   --   --   --   ALBUMIN 2.2*   < > 2.4*   < > 2.1*  2.1*   < > 2.1* 2.3* 2.2*   < > = values in this interval not displayed.   No results for input(s): LIPASE, AMYLASE in the last 168 hours. No results for input(s): AMMONIA in the last 168 hours. CBC: Recent Labs  Lab 11/01/20 0308 11/02/20 0341 11/02/20 0741 11/03/20 0401 11/04/20 0401 11/05/20 0426  WBC 27.1* 29.4*  --  28.2* 23.8* 22.7*  NEUTROABS 22.8*  --   --   --   --   --   HGB 8.1* 8.9*   < > 7.7* 7.6* 9.5*   HCT 27.8* 29.9*   < > 26.1* 25.4* 30.6*  MCV 82.5 82.1  --  81.1 81.9 78.5*  PLT 449* 423*  --  377 375 331   < > = values in this interval not displayed.   Cardiac Enzymes: No results for input(s): CKTOTAL, CKMB, CKMBINDEX, TROPONINI in the last 168 hours. CBG: Recent Labs  Lab 11/04/20 1143 11/04/20 1301 11/04/20 1557 11/04/20 1952 11/04/20 2353  GLUCAP 217* 228* 176* 140* 117*    Iron Studies: No results for input(s): IRON, TIBC, TRANSFERRIN, FERRITIN in the last 72 hours. Studies/Results: DG Abd 1 View  Result Date: 11/04/2020 CLINICAL DATA:  Check gastric catheter placement EXAM: ABDOMEN - 1 VIEW COMPARISON:  10/29/2020 FINDINGS: Gastric catheter is noted within the stomach. Diffuse vascular calcifications are seen. No obstructive pattern is noted. IMPRESSION: Gastric catheter within the stomach. Electronically Signed   By: Inez Catalina M.D.   On: 11/04/2020 22:24   DG CHEST PORT 1 VIEW  Result Date: 11/04/2020 CLINICAL DATA:  Pneumonia EXAM: PORTABLE CHEST 1 VIEW COMPARISON:  Chest x-ray dated 11/03/2020. FINDINGS: Heart size and mediastinal contours are stable. Endotracheal tube is well positioned with tip just above the level the carina. LEFT IJ central line is stable in position with tip at the level of the mid SVC. Lungs appear clear.  No pleural effusion or pneumothorax is seen. IMPRESSION: 1. No active disease. No evidence of pneumonia or pulmonary edema. No pneumothorax. 2. Endotracheal tube and LEFT IJ central line are stable in position. Electronically Signed   By: Franki Cabot M.D.   On: 11/04/2020 08:29   VAS [place] UPPER EXTREMITY ARTERIAL DUPLEX  Result Date: 11/04/2020  UPPER EXTREMITY DUPLEX STUDY Patient Name:  Christy Zhang  Date of Exam:   11/03/2020 Medical Rec #: [id number]          Accession #:    [id number] Date of Birth: 04/20/1956          Patient Gender: F Patient Age:   65Y Exam Location:  [facility] Procedure:      VAS [place] UPPER EXTREMITY ARTERIAL  DUPLEX Referring Phys: [id number] MATTHEW EVELAND --------------------------------------------------------------------------------  Indications: Cold, blue hand, unable to doppler radial pulse. History:     Patient has  a history of catheterization via left radial artery.  Other Factors: Vasopressors. Limitations: Bandaging, lines Comparison Study: No prior studies. Performing Technologist: Darlin Coco RDMS,RVT  Examination Guidelines: A complete evaluation includes B-mode imaging, spectral Doppler, color Doppler, and power Doppler as needed of all accessible portions of each vessel. Bilateral testing is considered an integral part of a complete examination. Limited examinations for reoccurring indications may be performed as noted.  Left Doppler Findings: +---------------+----------+------------------+--------+-----------------------+ Site           PSV (cm/s)Waveform          StenosisComments                +---------------+----------+------------------+--------+-----------------------+ Subclavian Prox54        audibly triphasic         Bandaging- poor angle                                                      of insonation           +---------------+----------+------------------+--------+-----------------------+ Subclavian Mid                                     Bandaging               +---------------+----------+------------------+--------+-----------------------+ Subclavian Dist                                    Bandaging               +---------------+----------+------------------+--------+-----------------------+ Axillary       98        triphasic                                         +---------------+----------+------------------+--------+-----------------------+ Brachial Prox  103       triphasic                                         +---------------+----------+------------------+--------+-----------------------+ Brachial Mid   96        triphasic                                          +---------------+----------+------------------+--------+-----------------------+ Brachial Dist  90        biphasic                  Non-obstructive lesion                                                     identified at the  distal brachial         +---------------+----------+------------------+--------+-----------------------+ Radial Prox    20        monophasic                                        +---------------+----------+------------------+--------+-----------------------+ Radial Mid     23        monophasic                                        +---------------+----------+------------------+--------+-----------------------+ Radial Dist                                occluded                        +---------------+----------+------------------+--------+-----------------------+ Ulnar Prox     85        biphasic                                          +---------------+----------+------------------+--------+-----------------------+ Ulnar Mid      28        monophasic                Calcified appearance    +---------------+----------+------------------+--------+-----------------------+ Ulnar Dist     7         dampened                  Calcified appearance                             monophasic                                        +---------------+----------+------------------+--------+-----------------------+ Occluded radial artery from the mid to distal segment. Ulnar artery patent with calcification through the mid to distal segments. Appearance of a non-obstructive lesion in the distal brachial artery.  Summary:  Left: Obstruction noted in the radial artery. *See table(s) above for measurements and observations. Electronically signed by Jamelle Haring on 11/04/2020 at 4:29:57 PM.    Final    ECHOCARDIOGRAM LIMITED  Result Date: 11/03/2020    ECHOCARDIOGRAM  LIMITED REPORT   Patient Name:   Christy Zhang Date of Exam: 11/03/2020 Medical Rec #:  [id number]         Height:       64.0 in Accession #:    [id number]        Weight:       191.1 lb Date of Birth:  10-29-1955         BSA:          1.919 m Patient Age:    50 years          BP:           114/43 mmHg Patient Gender: F                 HR:           80 bpm. Exam Location:  Inpatient  Procedure: Limited Color Doppler, Color Doppler and Cardiac Doppler Indications:    acute myocardial infarction  History:        Patient has prior history of Echocardiogram examinations, most                 recent 10/29/2020. End stage renal disease; Risk                 Factors:Hypertension.  Sonographer:    Delcie Roch Referring Phys: [id number] ROBERT J STRETCH IMPRESSIONS  1. Left ventricular ejection fraction, by estimation, is 60 to 65%. The left ventricle has normal function. The left ventricle has no regional wall motion abnormalities. There is moderate left ventricular hypertrophy. Left ventricular diastolic parameters are indeterminate.  2. Right ventricular systolic function is mildly reduced. The right ventricular size is normal. Tricuspid regurgitation signal is inadequate for assessing PA pressure.  3. A small pericardial effusion is present.  4. The mitral valve is normal in structure. No evidence of mitral valve regurgitation. No evidence of mitral stenosis.  5. The aortic valve is tricuspid. Aortic valve regurgitation is not visualized. Mild aortic valve sclerosis is present, with no evidence of aortic valve stenosis.  6. The inferior vena cava is normal in size with <50% respiratory variability, suggesting right atrial pressure of 8 mmHg. FINDINGS  Left Ventricle: Left ventricular ejection fraction, by estimation, is 60 to 65%. The left ventricle has normal function. The left ventricle has no regional wall motion abnormalities. The left ventricular internal cavity size was small. There is moderate  left ventricular  hypertrophy. Left ventricular diastolic parameters are indeterminate. Right Ventricle: The right ventricular size is normal. Right ventricular systolic function is mildly reduced. Tricuspid regurgitation signal is inadequate for assessing PA pressure. Pericardium: A small pericardial effusion is present. Mitral Valve: The mitral valve is normal in structure. No evidence of mitral valve stenosis. Tricuspid Valve: The tricuspid valve is normal in structure. Tricuspid valve regurgitation is trivial. Aortic Valve: The aortic valve is tricuspid. Aortic valve regurgitation is not visualized. Mild aortic valve sclerosis is present, with no evidence of aortic valve stenosis. Pulmonic Valve: The pulmonic valve was not well visualized. Pulmonic valve regurgitation is not visualized. Venous: The inferior vena cava is normal in size with less than 50% respiratory variability, suggesting right atrial pressure of 8 mmHg. LEFT VENTRICLE PLAX 2D LVIDd:         3.70 cm Diastology LVIDs:         2.70 cm LV e' medial:    6.85 cm/s LV PW:         1.30 cm LV E/e' medial:  8.1 LV IVS:        1.20 cm LV e' lateral:   7.72 cm/s                        LV E/e' lateral: 7.2  IVC IVC diam: 2.00 cm LEFT ATRIUM         Index LA diam:    3.70 cm 1.93 cm/m  AORTIC VALVE LVOT Vmax:   84.00 cm/s LVOT Vmean:  55.300 cm/s LVOT VTI:    0.154 m MITRAL VALVE MV Area (PHT): 2.66 cm    SHUNTS MV Decel Time: 285 msec    Systemic VTI: 0.15 m MV E velocity: 55.70 cm/s MV A velocity: 54.80 cm/s MV E/A ratio:  1.02 Epifanio Lesches MD Electronically signed by Epifanio Lesches MD Signature Date/Time: 11/03/2020/1:53:23 PM    Final  Medications: Infusions:   prismasol BGK 4/2.5 500 mL/hr at 11/04/20 2216    prismasol BGK 4/2.5 200 mL/hr at 11/03/20 2036   sodium chloride 10 mL/hr at 11/05/20 0700   feeding supplement (VITAL 1.5 CAL) 1,000 mL (11/04/20 1600)   fentaNYL infusion INTRAVENOUS 175 mcg/hr (11/05/20 0700)   heparin 1,050 Units/hr  (11/05/20 0700)   meropenem (MERREM) IV Stopped (11/05/20 8841)   phenylephrine (NEO-SYNEPHRINE) Adult infusion Stopped (11/04/20 1521)   prismasol BGK 2/2.5 dialysis solution 1,800 mL/hr at 11/05/20 0827   vancomycin Stopped (11/04/20 2345)   vasopressin Stopped (11/04/20 1901)    Scheduled Medications:  amiodarone  400 mg Oral BID   aspirin  81 mg Per Tube Daily   atorvastatin  40 mg Per Tube Daily   B-complex with vitamin C  1 tablet Per Tube Daily   chlorhexidine gluconate (MEDLINE KIT)  15 mL Mouth Rinse BID   Chlorhexidine Gluconate Cloth  6 each Topical Q0600   clopidogrel  75 mg Per Tube Daily   darbepoetin (ARANESP) injection - DIALYSIS  100 mcg Intravenous Q Tue-HD   docusate  100 mg Per Tube Daily   feeding supplement (PROSource TF)  45 mL Per Tube QID   fentaNYL (SUBLIMAZE) injection  100 mcg Intravenous Once   fentaNYL (SUBLIMAZE) injection  50 mcg Intravenous Once   guaiFENesin  15 mL Per Tube Q6H   insulin aspart  3-9 Units Subcutaneous Q4H   insulin aspart  4 Units Subcutaneous Q4H   insulin glargine  55 Units Subcutaneous Q12H   ipratropium-albuterol  3 mL Nebulization TID   mouth rinse  15 mL Mouth Rinse 10 times per day   pantoprazole sodium  40 mg Per Tube Daily   polyethylene glycol  17 g Per Tube Daily   sodium chloride flush  10-40 mL Intracatheter Q12H    have reviewed scheduled and prn medications.  Physical Exam: General: Critically ill looking female intubated, awake Heart:RRR, s1s2 nl Lungs: vent, b/l chest expansion Abdomen:soft,  non-distended Extremities: Bilateral BKA, no stump/thigh edema, left hand warmer to touch Neuro: awake Dialysis Access: Left IJ temporary HD catheter for CRRT.  AV fistula has good thrill and bruit.  Christy Zhang 11/05/2020,8:38 AM  LOS: 9 days

## 2020-11-05 NOTE — Progress Notes (Signed)
RT attempted pt on PSV 5/5 but had to titrate PS up to 14 to achieve adequate VT's. RN aware. RT to continue to monitor.

## 2020-11-05 NOTE — Progress Notes (Signed)
ANTICOAGULATION CONSULT NOTE  Pharmacy Consult for Heparin  Indication: atrial fibrillation  Allergies  Allergen Reactions   Sulfa Antibiotics Swelling    Patient Measurements: Height: '5\' 4"'$  (162.6 cm) Weight: 86.2 kg (190 lb 0.6 oz) IBW/kg (Calculated) : 54.7   Vital Signs: Temp: 96.8 F (36 C) (07/10 1130) Temp Source: Esophageal (07/10 1130) BP: 100/56 (07/10 1530) Pulse Rate: 83 (07/10 1530)  Labs: Recent Labs    11/03/20 0401 11/03/20 0516 11/03/20 0750 11/03/20 1325 11/03/20 1545 11/04/20 0401 11/04/20 0458 11/04/20 1731 11/05/20 0426  HGB 7.7*  --   --   --   --  7.6*  --   --  9.5*  HCT 26.1*  --   --   --   --  25.4*  --   --  30.6*  PLT 377  --   --   --   --  375  --   --  331  HEPARINUNFRC 0.80*  --   --  0.56  --  0.42  --   --  0.32  CREATININE 1.68*  --   --   --    < > 1.49*  --  1.68* 1.39*  TROPONINIHS  --  119* 115*  --   --   --  91*  --   --    < > = values in this interval not displayed.     Estimated Creatinine Clearance: 44 mL/min (A) (by C-G formula based on SCr of 1.39 mg/dL (H)).   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  She is also noted to be in afib/flutter  > SR on IV amiodarone IV heparin per pharmacy  Heparin level at goal (0.32) on gtt at 1050 units/hr. No bleeding noted per RN. Hgb stable 7-9, pltc stable no bleeding noted    Goal of Therapy:  Heparin level 0.3-0.7 units/ml Monitor platelets by anticoagulation protocol: Yes   Plan:  Continue IV heparin 1050 units/hr. Daily heparin level and CBC.    Bonnita Nasuti  Pharm.D. CPP, BCPS Clinical Pharmacist [phone] 11/05/2020 3:50 PM     [facility] pharmacy phone numbers are listed on [email or web address]

## 2020-11-05 NOTE — Progress Notes (Signed)
[facility] [place] PROGRESS NOTE  ASSESSMENT / PLAN: Christy Zhang is a 65 y.o. female status post left radial thrombectomy and patch angioplasty 11/04/20. Hand is well perfused. Avoid radial cannulation in the future. No need for anticoagulation from my standpoint. OK for ASA / Statin going forward.  SUBJECTIVE: Clinically improved. Awake. Hemodynamics better. Hand better perfused per bedside RN.  OBJECTIVE: BP (!) 146/46   Pulse (!) 148   Temp (!) 97 F (36.1 C)   Resp (!) 24   Ht '5\' 4"'$  (1.626 m)   Wt 86.2 kg   LMP  (LMP Unknown)   SpO2 99%   BMI 32.62 kg/m   Intake/Output Summary (Last 24 hours) at 11/05/2020 0949 Last data filed at 11/05/2020 0700 Gross per 24 hour  Intake 3629.04 ml  Output 2205 ml  Net 1424.04 ml     Constitutional: critically ill intubated.  Cardiac: RRR. Pulmonary: unlabored Abdomen: soft Vascular: left hand with dry gangrene of first, second, and fifth finger tips. Palmar hand is pink and warm. Ischemic skin changes over thenar eminence improved. Strong radial signal distal to repair. Palmar arch signal strong.  CBC Latest Ref Rng & Units 11/05/2020 11/04/2020 11/03/2020  WBC 4.0 - 10.5 K/uL 22.7(H) 23.8(H) 28.2(H)  Hemoglobin 12.0 - 15.0 g/dL 9.5(L) 7.6(L) 7.7(L)  Hematocrit 36.0 - 46.0 % 30.6(L) 25.4(L) 26.1(L)  Platelets 150 - 400 K/uL 331 375 377     CMP Latest Ref Rng & Units 11/05/2020 11/04/2020 11/04/2020  Glucose 70 - 99 mg/dL 142(H) 209(H) 175(H)  BUN 8 - 23 mg/dL '19 23 19  '$ Creatinine 0.44 - 1.00 mg/dL 1.39(H) 1.68(H) 1.49(H)  Sodium 135 - 145 mmol/L 134(L) 135 134(L)  Potassium 3.5 - 5.1 mmol/L 3.8 3.8 4.1  Chloride 98 - 111 mmol/L 101 101 100  CO2 22 - 32 mmol/L '24 23 25  '$ Calcium 8.9 - 10.3 mg/dL 8.5(L) 9.0 8.4(L)  Total Protein 6.5 - 8.1 g/dL - - -  Total Bilirubin 0.3 - 1.2 mg/dL - - -  Alkaline Phos 38 - 126 U/L - - -  AST 15 - 41 U/L - - -  ALT 0 - 44 U/L - - -    Estimated Creatinine Clearance: 44  mL/min (A) (by C-G formula based on SCr of 1.39 mg/dL (H)).  Christy Aline. Stanford Breed, MD [facility] Phone Number: [phone] 11/05/2020 9:49 AM

## 2020-11-05 NOTE — Progress Notes (Signed)
Progress Note  Patient Name: TANINA BARB Date of Encounter: 11/05/2020  Primary Cardiologist: None   Subjective   Overnight radial surgery performed with thrombectomy and patch.  PC and family meetings started and presently no change in [place].  As per last noted transitioned to amiodarone PO.  With this BP has improved.  Inpatient Medications    Scheduled Meds:  amiodarone  400 mg Oral BID   aspirin  81 mg Per Tube Daily   atorvastatin  40 mg Per Tube Daily   B-complex with vitamin C  1 tablet Per Tube Daily   chlorhexidine gluconate (MEDLINE KIT)  15 mL Mouth Rinse BID   Chlorhexidine Gluconate Cloth  6 each Topical Q0600   clopidogrel  75 mg Per Tube Daily   darbepoetin (ARANESP) injection - DIALYSIS  100 mcg Intravenous Q Tue-HD   docusate  100 mg Per Tube Daily   feeding supplement (PROSource TF)  45 mL Per Tube QID   fentaNYL (SUBLIMAZE) injection  100 mcg Intravenous Once   fentaNYL (SUBLIMAZE) injection  50 mcg Intravenous Once   guaiFENesin  15 mL Per Tube Q6H   insulin aspart  3-9 Units Subcutaneous Q4H   insulin aspart  4 Units Subcutaneous Q4H   insulin glargine  55 Units Subcutaneous Q12H   ipratropium-albuterol  3 mL Nebulization TID   mouth rinse  15 mL Mouth Rinse 10 times per day   pantoprazole sodium  40 mg Per Tube Daily   polyethylene glycol  17 g Per Tube Daily   sodium chloride flush  10-40 mL Intracatheter Q12H   Continuous Infusions:   prismasol BGK 4/2.5 500 mL/hr at 11/05/20 0842    prismasol BGK 4/2.5 200 mL/hr at 11/03/20 2036   sodium chloride 10 mL/hr at 11/05/20 0700   feeding supplement (VITAL 1.5 CAL) 1,000 mL (11/04/20 1600)   fentaNYL infusion INTRAVENOUS 175 mcg/hr (11/05/20 0700)   heparin 1,050 Units/hr (11/05/20 0700)   meropenem (MERREM) IV Stopped (11/05/20 6384)   phenylephrine (NEO-SYNEPHRINE) Adult infusion Stopped (11/04/20 1521)   prismasol BGK 2/2.5 dialysis solution 1,800 mL/hr at 11/05/20 0827   vancomycin Stopped  (11/04/20 2345)   vasopressin Stopped (11/04/20 1901)   PRN Meds: sodium chloride, acetaminophen (TYLENOL) oral liquid 160 mg/5 mL, albuterol, alteplase, bisacodyl, heparin, hydrALAZINE, metoprolol tartrate, midazolam, sodium chloride, sodium chloride flush   Vital Signs    Vitals:   11/05/20 0645 11/05/20 0700 11/05/20 0715 11/05/20 0750  BP:      Pulse: 80 83 78   Resp: (!) 24 (!) 25 (!) 24   Temp:  (!) 97 F (36.1 C)    TempSrc:      SpO2: 100% 100% 100% 100%  Weight:      Height:        Intake/Output Summary (Last 24 hours) at 11/05/2020 0848 Last data filed at 11/05/2020 0700 Gross per 24 hour  Intake 3740.27 ml  Output 2352 ml  Net 1388.27 ml   Filed Weights   11/03/20 0500 11/04/20 0500 11/05/20 0615  Weight: 86.7 kg 85.3 kg 86.2 kg    Telemetry    Multiple salvos of SVT (suspect AT) with four events this AM; improving in frequency from 11/04/20- Personally Reviewed  ECG    No new 11/05/20- Personally Reviewed  Physical Exam   GEN: Tracks and opens eyes; nods head appropriately Neck: Unable to assess JVD Cardiac: regular rate and rhythm, continue murmur radiating from R AV fistula Respiratory: Mechanical BS GI: Soft, nontender,  non-distended  MS: No edema; leg stump sites C/D/I, L hand appears markedly improved  Neuro:  Nonfocal  Psych: intubated but with appropriate gestures to questions  Labs    Chemistry Recent Labs  Lab 11/01/20 0308 11/01/20 1600 11/02/20 0341 11/02/20 0741 11/03/20 0401 11/03/20 1545 11/04/20 0401 11/04/20 1731 11/05/20 0426  NA 134*   < > 131*   < > 134*   < > 134* 135 134*  K 4.4   < > 5.2*   < > 5.0   < > 4.1 3.8 3.8  CL 102   < > 98   < > 102   < > 100 101 101  CO2 23   < > 22   < > 23   < > _0 GLUCOSE 238*   < > 306*   < > 200*   < > 175* 209* 142*  BUN 19   < > 21   < > 22   < > _1 CREATININE 1.75*   < > 1.65*   < > 1.68*   < > 1.49* 1.68* 1.39*  CALCIUM 8.1*   < > 8.8*   < > 8.7*   < > 8.4* 9.0 8.5*   PROT 6.5  --  7.7  --  7.4  7.1  --   --   --   --   ALBUMIN 2.2*   < > 2.4*   < > 2.1*  2.1*   < > 2.1* 2.3* 2.2*  AST 109*  --  74*  --  87*  84*  --   --   --   --   ALT 142*  --  117*  --  77*  75*  --   --   --   --   ALKPHOS 79  --  93  --  85  84  --   --   --   --   BILITOT 0.8  --  0.8  --  0.8  1.1  --   --   --   --   GFRNONAA 32*   < > 35*   < > 34*   < > 39* 34* 43*  ANIONGAP 9   < > 11   < > 9   < > _2 < > = values in this interval not displayed.     Hematology Recent Labs  Lab 11/03/20 0401 11/04/20 0401 11/05/20 0426  WBC 28.2* 23.8* 22.7*  RBC 3.22* 3.10* 3.90  HGB 7.7* 7.6* 9.5*  HCT 26.1* 25.4* 30.6*  MCV 81.1 81.9 78.5*  MCH 23.9* 24.5* 24.4*  MCHC 29.5* 29.9* 31.0  RDW 20.9* 21.4* 23.3*  PLT 377 375 331    Cardiac EnzymesNo results for input(s): TROPONINI in the last 168 hours. No results for input(s): TROPIPOC in the last 168 hours.   BNP Recent Labs  Lab 11/04/20 0401  BNP 230.6*     DDimer No results for input(s): DDIMER in the last 168 hours.   Radiology    DG Abd 1 View  Result Date: 11/04/2020 CLINICAL DATA:  Check gastric catheter placement EXAM: ABDOMEN - 1 VIEW COMPARISON:  10/29/2020 FINDINGS: Gastric catheter is noted within the stomach. Diffuse vascular calcifications are seen. No obstructive pattern is noted. IMPRESSION: Gastric catheter within the stomach. Electronically Signed   By: Inez Catalina M.D.   On: 11/04/2020 22:24   DG CHEST PORT 1 VIEW  Result Date: 11/04/2020 CLINICAL DATA:  Pneumonia EXAM: PORTABLE CHEST 1 VIEW COMPARISON:  Chest x-ray dated 11/03/2020. FINDINGS: Heart size and mediastinal contours are stable. Endotracheal tube is well positioned with tip just above the level the carina. LEFT IJ central line is stable in position with tip at the level of the mid SVC. Lungs appear clear.  No pleural effusion or pneumothorax is seen. IMPRESSION: 1. No active disease. No evidence of pneumonia or pulmonary edema.  No pneumothorax. 2. Endotracheal tube and LEFT IJ central line are stable in position. Electronically Signed   By: Franki Cabot M.D.   On: 11/04/2020 08:29   VAS [place] UPPER EXTREMITY ARTERIAL DUPLEX  Result Date: 11/04/2020  UPPER EXTREMITY DUPLEX STUDY Patient Name:  SIRA ADSIT  Date of Exam:   11/03/2020 Medical Rec #: [id number]          Accession #:    [id number] Date of Birth: [birth year]          Patient Gender: F Patient Age:   65Y Exam Location:  [facility] Procedure:      VAS [place] UPPER EXTREMITY ARTERIAL DUPLEX Referring Phys: [id number] MATTHEW EVELAND --------------------------------------------------------------------------------  Indications: Cold, blue hand, unable to doppler radial pulse. History:     Patient has a history of catheterization via left radial artery.  Other Factors: Vasopressors. Limitations: Bandaging, lines Comparison Study: No prior studies. Performing Technologist: Darlin Coco RDMS,RVT  Examination Guidelines: A complete evaluation includes B-mode imaging, spectral Doppler, color Doppler, and power Doppler as needed of all accessible portions of each vessel. Bilateral testing is considered an integral part of a complete examination. Limited examinations for reoccurring indications may be performed as noted.  Left Doppler Findings: +---------------+----------+------------------+--------+-----------------------+ Site           PSV (cm/s)Waveform          StenosisComments                +---------------+----------+------------------+--------+-----------------------+ Subclavian Prox54        audibly triphasic         Bandaging- poor angle                                                      of insonation           +---------------+----------+------------------+--------+-----------------------+ Subclavian Mid                                     Bandaging               +---------------+----------+------------------+--------+-----------------------+  Subclavian Dist                                    Bandaging               +---------------+----------+------------------+--------+-----------------------+ Axillary       98        triphasic                                         +---------------+----------+------------------+--------+-----------------------+ Brachial Prox  103       triphasic                                         +---------------+----------+------------------+--------+-----------------------+  Brachial Mid   96        triphasic                                         +---------------+----------+------------------+--------+-----------------------+ Brachial Dist  90        biphasic                  Non-obstructive lesion                                                     identified at the                                                          distal brachial         +---------------+----------+------------------+--------+-----------------------+ Radial Prox    20        monophasic                                        +---------------+----------+------------------+--------+-----------------------+ Radial Mid     23        monophasic                                        +---------------+----------+------------------+--------+-----------------------+ Radial Dist                                occluded                        +---------------+----------+------------------+--------+-----------------------+ Ulnar Prox     85        biphasic                                          +---------------+----------+------------------+--------+-----------------------+ Ulnar Mid      28        monophasic                Calcified appearance    +---------------+----------+------------------+--------+-----------------------+ Ulnar Dist     7         dampened                  Calcified appearance                             monophasic                                         +---------------+----------+------------------+--------+-----------------------+ Occluded radial artery from the mid to distal segment. Ulnar artery patent with calcification through the mid to distal segments. Appearance of a non-obstructive lesion in  the distal brachial artery.  Summary:  Left: Obstruction noted in the radial artery. *See table(s) above for measurements and observations. Electronically signed by Jamelle Haring on 11/04/2020 at 4:29:57 PM.    Final    ECHOCARDIOGRAM LIMITED  Result Date: 11/03/2020    ECHOCARDIOGRAM LIMITED REPORT   Patient Name:   LATEKA RADY Date of Exam: 11/03/2020 Medical Rec #:  [id number]         Height:       64.0 in Accession #:    [id number]        Weight:       191.1 lb Date of Birth:  1955-08-25         BSA:          1.919 m Patient Age:    65 years          BP:           114/43 mmHg Patient Gender: F                 HR:           80 bpm. Exam Location:  Inpatient Procedure: Limited Color Doppler, Color Doppler and Cardiac Doppler Indications:    acute myocardial infarction  History:        Patient has prior history of Echocardiogram examinations, most                 recent 10/29/2020. End stage renal disease; Risk                 Factors:Hypertension.  Sonographer:    Johny Chess Referring Phys: [id number] [place]  1. Left ventricular ejection fraction, by estimation, is 60 to 65%. The left ventricle has normal function. The left ventricle has no regional wall motion abnormalities. There is moderate left ventricular hypertrophy. Left ventricular diastolic parameters are indeterminate.  2. Right ventricular systolic function is mildly reduced. The right ventricular size is normal. Tricuspid regurgitation signal is inadequate for assessing PA pressure.  3. A small pericardial effusion is present.  4. The mitral valve is normal in structure. No evidence of mitral valve regurgitation. No evidence of mitral stenosis.  5. The aortic valve is  tricuspid. Aortic valve regurgitation is not visualized. Mild aortic valve sclerosis is present, with no evidence of aortic valve stenosis.  6. The inferior vena cava is normal in size with <50% respiratory variability, suggesting right atrial pressure of 8 mmHg. FINDINGS  Left Ventricle: Left ventricular ejection fraction, by estimation, is 60 to 65%. The left ventricle has normal function. The left ventricle has no regional wall motion abnormalities. The left ventricular internal cavity size was small. There is moderate  left ventricular hypertrophy. Left ventricular diastolic parameters are indeterminate. Right Ventricle: The right ventricular size is normal. Right ventricular systolic function is mildly reduced. Tricuspid regurgitation signal is inadequate for assessing PA pressure. Pericardium: A small pericardial effusion is present. Mitral Valve: The mitral valve is normal in structure. No evidence of mitral valve stenosis. Tricuspid Valve: The tricuspid valve is normal in structure. Tricuspid valve regurgitation is trivial. Aortic Valve: The aortic valve is tricuspid. Aortic valve regurgitation is not visualized. Mild aortic valve sclerosis is present, with no evidence of aortic valve stenosis. Pulmonic Valve: The pulmonic valve was not well visualized. Pulmonic valve regurgitation is not visualized. Venous: The inferior vena cava is normal in size with less than 50% respiratory variability, suggesting right atrial pressure of 8 mmHg. LEFT VENTRICLE PLAX 2D  LVIDd:         3.70 cm Diastology LVIDs:         2.70 cm LV e' medial:    6.85 cm/s LV PW:         1.30 cm LV E/e' medial:  8.1 LV IVS:        1.20 cm LV e' lateral:   7.72 cm/s                        LV E/e' lateral: 7.2  IVC IVC diam: 2.00 cm LEFT ATRIUM         Index LA diam:    3.70 cm 1.93 cm/m  AORTIC VALVE LVOT Vmax:   84.00 cm/s LVOT Vmean:  55.300 cm/s LVOT VTI:    0.154 m MITRAL VALVE MV Area (PHT): 2.66 cm    SHUNTS MV Decel Time: 285 msec     Systemic VTI: 0.15 m MV E velocity: 55.70 cm/s MV A velocity: 54.80 cm/s MV E/A ratio:  1.02 Oswaldo Milian MD Electronically signed by Oswaldo Milian MD Signature Date/Time: 11/03/2020/1:53:23 PM    Final      Patient Profile     65 y.o. female PNA s/p chest tube (7/5 start with 7/7 removal) new AF, ESRD, and PAD (bilateral amputee)  Assessment & Plan    Coronary Artery Calcifications Aortic Atherosclerosis and TWI ESRD on CVVHD Shock PAD DM SVT - troponin related to demand ischemia - agree with heparin - not a candidate for AHF therapies with profound vasculopathy; similarly with PAD unclear how we would successfully eval by LHC - volume removed per nephrology  - with transition off of amiodarone from IV to PO SVT control is stable to improving and BP is improving; still having SVT with vent wean - increasing to 400 mg TID - can get 150 mg IV bolus if HD significant SVT   CRITICAL CARE Performed by: Katherina Wimer A Dameon Soltis  Total critical care time: 35 minutes. Critical care time was exclusive of separately billable procedures and treating other patients. Critical care was necessary to treat or prevent imminent or life-threatening deterioration. Critical care was time spent personally by me on the following activities: development of treatment plan with patient and/or surrogate as well as nursing, discussions with consultants, evaluation of patient's response to treatment, examination of patient, obtaining history from patient or surrogate, ordering and performing treatments and interventions, ordering and review of laboratory studies, ordering and review of radiographic studies, pulse oximetry and re-evaluation of patient's condition.    Signed, Rudean Haskell, MD [place]  11/05/2020 8:48 AM       For questions or updates, please contact [place] Please consult [email or web address] for contact info under Cardiology/STEMI.       Signed, Werner Lean, MD  11/05/2020, 8:48 AM

## 2020-11-06 ENCOUNTER — Encounter (HOSPITAL_COMMUNITY): Payer: Self-pay | Admitting: Vascular Surgery

## 2020-11-06 ENCOUNTER — Ambulatory Visit: Payer: Medicare Other | Admitting: Licensed Clinical Social Worker

## 2020-11-06 DIAGNOSIS — E1122 Type 2 diabetes mellitus with diabetic chronic kidney disease: Secondary | ICD-10-CM

## 2020-11-06 DIAGNOSIS — S88112A Complete traumatic amputation at level between knee and ankle, left lower leg, initial encounter: Secondary | ICD-10-CM

## 2020-11-06 DIAGNOSIS — N186 End stage renal disease: Secondary | ICD-10-CM | POA: Diagnosis not present

## 2020-11-06 DIAGNOSIS — I10 Essential (primary) hypertension: Secondary | ICD-10-CM

## 2020-11-06 DIAGNOSIS — J4530 Mild persistent asthma, uncomplicated: Secondary | ICD-10-CM | POA: Diagnosis not present

## 2020-11-06 DIAGNOSIS — E66813 Obesity, class 3: Secondary | ICD-10-CM

## 2020-11-06 DIAGNOSIS — N185 Chronic kidney disease, stage 5: Secondary | ICD-10-CM | POA: Diagnosis not present

## 2020-11-06 DIAGNOSIS — D631 Anemia in chronic kidney disease: Secondary | ICD-10-CM

## 2020-11-06 DIAGNOSIS — IMO0002 Reserved for concepts with insufficient information to code with codable children: Secondary | ICD-10-CM

## 2020-11-06 DIAGNOSIS — J189 Pneumonia, unspecified organism: Secondary | ICD-10-CM | POA: Diagnosis not present

## 2020-11-06 DIAGNOSIS — Z992 Dependence on renal dialysis: Secondary | ICD-10-CM

## 2020-11-06 DIAGNOSIS — I4819 Other persistent atrial fibrillation: Secondary | ICD-10-CM | POA: Diagnosis not present

## 2020-11-06 DIAGNOSIS — Z89511 Acquired absence of right leg below knee: Secondary | ICD-10-CM

## 2020-11-06 DIAGNOSIS — J9601 Acute respiratory failure with hypoxia: Secondary | ICD-10-CM | POA: Diagnosis not present

## 2020-11-06 DIAGNOSIS — N2889 Other specified disorders of kidney and ureter: Secondary | ICD-10-CM

## 2020-11-06 DIAGNOSIS — E1165 Type 2 diabetes mellitus with hyperglycemia: Secondary | ICD-10-CM | POA: Diagnosis not present

## 2020-11-06 DIAGNOSIS — I151 Hypertension secondary to other renal disorders: Secondary | ICD-10-CM

## 2020-11-06 LAB — GLUCOSE, CAPILLARY
Glucose-Capillary: 103 mg/dL — ABNORMAL HIGH (ref 70–99)
Glucose-Capillary: 121 mg/dL — ABNORMAL HIGH (ref 70–99)
Glucose-Capillary: 123 mg/dL — ABNORMAL HIGH (ref 70–99)
Glucose-Capillary: 162 mg/dL — ABNORMAL HIGH (ref 70–99)
Glucose-Capillary: 78 mg/dL (ref 70–99)
Glucose-Capillary: 85 mg/dL (ref 70–99)

## 2020-11-06 LAB — POCT I-STAT 7, (LYTES, BLD GAS, ICA,H+H)
Acid-Base Excess: 2 mmol/L (ref 0.0–2.0)
Bicarbonate: 28.3 mmol/L — ABNORMAL HIGH (ref 20.0–28.0)
Calcium, Ion: 1.19 mmol/L (ref 1.15–1.40)
HCT: 34 % — ABNORMAL LOW (ref 36.0–46.0)
Hemoglobin: 11.6 g/dL — ABNORMAL LOW (ref 12.0–15.0)
O2 Saturation: 99 %
Patient temperature: 98.7
Potassium: 3.9 mmol/L (ref 3.5–5.1)
Sodium: 137 mmol/L (ref 135–145)
TCO2: 30 mmol/L (ref 22–32)
pCO2 arterial: 51.3 mmHg — ABNORMAL HIGH (ref 32.0–48.0)
pH, Arterial: 7.351 (ref 7.350–7.450)
pO2, Arterial: 153 mmHg — ABNORMAL HIGH (ref 83.0–108.0)

## 2020-11-06 LAB — CBC
HCT: 25.3 % — ABNORMAL LOW (ref 36.0–46.0)
Hemoglobin: 7.5 g/dL — ABNORMAL LOW (ref 12.0–15.0)
MCH: 24 pg — ABNORMAL LOW (ref 26.0–34.0)
MCHC: 29.6 g/dL — ABNORMAL LOW (ref 30.0–36.0)
MCV: 80.8 fL (ref 80.0–100.0)
Platelets: 342 10*3/uL (ref 150–400)
RBC: 3.13 MIL/uL — ABNORMAL LOW (ref 3.87–5.11)
RDW: 22.6 % — ABNORMAL HIGH (ref 11.5–15.5)
WBC: 18.8 10*3/uL — ABNORMAL HIGH (ref 4.0–10.5)
nRBC: 0.2 % (ref 0.0–0.2)

## 2020-11-06 LAB — RENAL FUNCTION PANEL
Albumin: 2.2 g/dL — ABNORMAL LOW (ref 3.5–5.0)
Albumin: 2.4 g/dL — ABNORMAL LOW (ref 3.5–5.0)
Anion gap: 8 (ref 5–15)
Anion gap: 9 (ref 5–15)
BUN: 17 mg/dL (ref 8–23)
BUN: 16 mg/dL (ref 8–23)
CO2: 24 mmol/L (ref 22–32)
CO2: 26 mmol/L (ref 22–32)
Calcium: 8.2 mg/dL — ABNORMAL LOW (ref 8.9–10.3)
Calcium: 8.8 mg/dL — ABNORMAL LOW (ref 8.9–10.3)
Chloride: 104 mmol/L (ref 98–111)
Chloride: 101 mmol/L (ref 98–111)
Creatinine, Ser: 1.32 mg/dL — ABNORMAL HIGH (ref 0.44–1.00)
Creatinine, Ser: 1.25 mg/dL — ABNORMAL HIGH (ref 0.44–1.00)
GFR, Estimated: 45 mL/min — ABNORMAL LOW (ref >60)
GFR, Estimated: 48 mL/min — ABNORMAL LOW (ref >60)
Glucose, Bld: 77 mg/dL (ref 70–99)
Glucose, Bld: 82 mg/dL (ref 70–99)
Phosphorus: 3 mg/dL (ref 2.5–4.6)
Phosphorus: 2.8 mg/dL (ref 2.5–4.6)
Potassium: 3.1 mmol/L — ABNORMAL LOW (ref 3.5–5.1)
Potassium: 3.7 mmol/L (ref 3.5–5.1)
Sodium: 136 mmol/L (ref 135–145)
Sodium: 136 mmol/L (ref 135–145)

## 2020-11-06 LAB — HEPARIN LEVEL (UNFRACTIONATED)
Heparin Unfractionated: <0.1 IU/mL — ABNORMAL LOW (ref 0.30–0.70)
Heparin Unfractionated: 0.32 IU/mL (ref 0.30–0.70)

## 2020-11-06 LAB — MAGNESIUM: Magnesium: 2.4 mg/dL (ref 1.7–2.4)

## 2020-11-06 MED ORDER — CHLORHEXIDINE GLUCONATE CLOTH 2 % EX PADS
6.0000 | MEDICATED_PAD | Freq: Every day | CUTANEOUS | Status: DC
  Administered 2020-11-07 – 2020-11-13 (×6): 6 via TOPICAL

## 2020-11-06 MED ORDER — SORBITOL 70 % SOLN
960.0000 mL | TOPICAL_OIL | Freq: Once | ORAL | Status: AC
  Administered 2020-11-06: 960 mL via RECTAL
  Filled 2020-11-06: qty 473

## 2020-11-06 MED ORDER — ORAL CARE MOUTH RINSE
15.0000 mL | Freq: Two times a day (BID) | OROMUCOSAL | Status: DC
  Administered 2020-11-06 – 2020-11-13 (×12): 15 mL via OROMUCOSAL

## 2020-11-06 MED ORDER — POLYETHYLENE GLYCOL 3350 17 G PO PACK
17.0000 g | PACK | Freq: Two times a day (BID) | ORAL | Status: DC
  Administered 2020-11-06: 17 g
  Filled 2020-11-06: qty 1

## 2020-11-06 MED ORDER — INSULIN GLARGINE 100 UNIT/ML ~~LOC~~ SOLN
20.0000 [IU] | Freq: Two times a day (BID) | SUBCUTANEOUS | Status: DC
  Administered 2020-11-06: 20 [IU] via SUBCUTANEOUS
  Filled 2020-11-06 (×3): qty 0.2

## 2020-11-06 MED ORDER — CHLORHEXIDINE GLUCONATE CLOTH 2 % EX PADS: 6.0000 | MEDICATED_PAD | Freq: Every day | CUTANEOUS | Status: DC

## 2020-11-06 MED ORDER — AMIODARONE HCL 200 MG PO TABS
400.0000 mg | ORAL_TABLET | Freq: Two times a day (BID) | ORAL | Status: AC
  Administered 2020-11-06 – 2020-11-12 (×13): 400 mg via ORAL
  Filled 2020-11-06 (×13): qty 2

## 2020-11-06 MED ORDER — AMIODARONE HCL 200 MG PO TABS
200.0000 mg | ORAL_TABLET | Freq: Every day | ORAL | Status: DC
  Administered 2020-11-13 – 2020-11-14 (×2): 200 mg via ORAL
  Filled 2020-11-06 (×2): qty 1

## 2020-11-06 MED ORDER — INSULIN GLARGINE 100 UNIT/ML ~~LOC~~ SOLN
35.0000 [IU] | Freq: Two times a day (BID) | SUBCUTANEOUS | Status: DC
  Administered 2020-11-06: 35 [IU] via SUBCUTANEOUS
  Filled 2020-11-06 (×2): qty 0.35

## 2020-11-06 MED ORDER — PRISMASOL BGK 4/2.5 32-4-2.5 MEQ/L EC SOLN: Status: DC

## 2020-11-06 MED ORDER — CHLORHEXIDINE GLUCONATE 0.12 % MT SOLN
15.0000 mL | Freq: Two times a day (BID) | OROMUCOSAL | Status: DC
  Administered 2020-11-06 – 2020-11-14 (×16): 15 mL via OROMUCOSAL
  Filled 2020-11-06 (×13): qty 15

## 2020-11-06 NOTE — Chronic Care Management (AMB) (Signed)
Chronic Care Management    Clinical Social Work Note  11/06/2020 Name: Christy Zhang MRN: [id number] DOB: June 15, 1955  Christy Zhang is a 65 y.o. year old female who is a primary care patient of Christy Zhang, Christy Gash, MD. Christy CCM team was consulted to assist Christy patient with chronic disease management and/or care coordination needs related to: [name] .   Engaged with patient / daughter of patient, Christy Zhang, by telephone for follow up visit in response to provider referral for social work chronic care management and care coordination services.   Consent to Services:  Christy patient was given information about Chronic Care Management services, agreed to services, and gave verbal consent prior to initiation of services.  Please see initial visit note for detailed documentation.   Patient agreed to services and consent obtained.   Assessment: Review of patient past medical history, allergies, medications, and [name], including review of relevant consultants reports was performed today as part of a comprehensive evaluation and provision of chronic care management and care coordination services.     SDOH (Social Determinants of Zhang) assessments and interventions performed:  SDOH Interventions    Flowsheet Row Most Recent Value  SDOH Interventions   Depression Interventions/Treatment  --  [informed client and her daughter of LCSW support and of RNCM support]        Advanced Directives Status: See Vynca application for related entries.  CCM Care Plan  Allergies  Allergen Reactions   Sulfa Antibiotics Swelling    Facility-Administered Encounter Medications as of 11/06/2020  Medication    prismasol BGK 4/2.5 infusion    prismasol BGK 4/2.5 infusion   0.9 %  sodium chloride infusion   acetaminophen (TYLENOL) 160 MG/5ML solution 650 mg   albuterol (PROVENTIL) (2.5 MG/3ML) 0.083% nebulizer solution 2.5 mg   alteplase (CATHFLO ACTIVASE) injection 2 mg    amiodarone (PACERONE) tablet 400 mg   Followed by   [name] ON 11/13/2020] amiodarone (PACERONE) tablet 200 mg   aspirin chewable tablet 81 mg   atorvastatin (LIPITOR) tablet 40 mg   B-complex with vitamin C tablet 1 tablet   bisacodyl (DULCOLAX) suppository 10 mg   chlorhexidine (PERIDEX) 0.12 % solution 15 mL   Chlorhexidine Gluconate Cloth 2 % PADS 6 each   clopidogrel (PLAVIX) tablet 75 mg   Darbepoetin Alfa (ARANESP) injection 100 mcg   docusate (COLACE) 50 MG/5ML liquid 100 mg   feeding supplement (PROSource TF) liquid 45 mL   feeding supplement (VITAL 1.5 CAL) liquid 1,000 mL   fentaNYL (SUBLIMAZE) injection 100 mcg   fentaNYL (SUBLIMAZE) injection 50 mcg   fentaNYL 2517mg in NS 2559m(1085mml) infusion-PREMIX   heparin ADULT infusion 100 units/mL (25000 units/250m48m heparin injection 1,000-6,000 Units   hydrALAZINE (APRESOLINE) injection 10 mg   insulin aspart (novoLOG) injection 3-9 Units   insulin glargine (LANTUS) injection 20 Units   MEDLINE mouth rinse   metoprolol tartrate (LOPRESSOR) injection 2.5 mg   midazolam (VERSED) injection 2 mg   pantoprazole sodium (PROTONIX) 40 mg/20 mL oral suspension 40 mg   phenylephrine (NEOSYNEPHRINE) 10-0.9 MG/250ML-% infusion   polyethylene glycol (MIRALAX / GLYCOLAX) packet 17 g   prismasol BGK 4/2.5 infusion   sodium chloride 0.9 % primer fluid for CRRT   sodium chloride flush (NS) 0.9 % injection 10-40 mL   sodium chloride flush (NS) 0.9 % injection 10-40 mL   [DISCONTINUED] insulin aspart (novoLOG) injection 4 Units   [DISCONTINUED] insulin glargine (LANTUS) injection 35 Units   Outpatient  Encounter Medications as of 11/06/2020  Medication Sig Note   acetaminophen (TYLENOL) 325 MG tablet Take 1-2 tablets (325-650 mg total) by mouth every 4 (four) hours as needed for mild pain.    amLODipine (NORVASC) 5 MG tablet TAKE ONE TABLET BY MOUTH AT BEDTIME (Patient taking differently: Take 5 mg by mouth daily.)    aspirin EC 81 MG  tablet Take 81 mg by mouth at bedtime. Swallow whole.    atorvastatin (LIPITOR) 40 MG tablet TAKE ONE TABLET BY MOUTH DAILY (Patient taking differently: at bedtime.)    clopidogrel (PLAVIX) 75 MG tablet Take 1 tablet (75 mg total) by mouth daily. (Patient taking differently: Take 75 mg by mouth every evening.) 09/15/2020: On hold until Christy procedure   diphenhydrAMINE (BENADRYL) 25 MG tablet Take 25 mg by mouth every 6 (six) hours as needed for itching.    gabapentin (NEURONTIN) 300 MG capsule Take 1 capsule (300 mg total) by mouth at bedtime.    insulin detemir (LEVEMIR) 100 UNIT/ML injection Inject 0.16 mLs (16 Units total) into Christy skin daily.    Insulin Syringe-Needle U-100 28G X 1/2" 1 ML MISC Use with insulin daily Dx E11.22    metoprolol succinate (TOPROL-XL) 50 MG 24 hr tablet TAKE ONE TABLET BY MOUTH DAILY. TAKE WITH OR IMMEDIATELY FOLLOWING A MEAL (Patient taking differently: Take 50 mg by mouth at bedtime.)    multivitamin (RENA-VIT) TABS tablet Take 1 tablet by mouth at bedtime.  (Patient not taking: No sig reported)    oxyCODONE (OXY IR/ROXICODONE) 5 MG immediate release tablet Take 1 tablet (5 mg total) by mouth every 6 (six) hours as needed for severe pain.    polyethylene glycol (MIRALAX / GLYCOLAX) 17 g packet Take 17 g by mouth daily. (Patient not taking: No sig reported)    senna-docusate (SENOKOT-S) 8.6-50 MG tablet Take 1 tablet by mouth 2 (two) times daily. (Patient taking differently: Take 1 tablet by mouth 2 (two) times daily as needed for mild constipation.)    sevelamer carbonate (RENVELA) 800 MG tablet Take 1,600-2,400 mg by mouth See admin instructions. Take 2400 mg with each meal and 1600 mg with each snack    TRULICITY 1.5 0000000 SOPN INJECT CONTENTs OF ONE PEN UNDER Christy SKIN WEEKLY ON SATURDAY (Patient taking differently: Inject 1.5 mg into Christy skin every Saturday.) 10/27/2020: Missed it sat so she took it on Wed    Patient Active Problem List   Diagnosis Date Noted    Cardiac arrest ([place])    CAP (Christy acquired pneumonia) 10/27/2020   Altered mental status 10/27/2020   Acute respiratory failure with hypoxia ([place]) 10/27/2020   Lactic acidosis 10/27/2020   Hypoalbuminemia 10/27/2020   Hyponatremia 10/27/2020   Hyperlipidemia 10/27/2020   S/P BKA (below knee amputation) unilateral, right ([place]) 09/14/2020   Diabetic foot infection ([place])    Medication monitoring encounter    Pressure injury of skin 08/11/2020   Thrombocytosis 08/09/2020   Osteomyelitis of right foot ([place]) 08/08/2020   Leucocytosis 07/05/2020   Below-knee amputation of left lower extremity ([place]) 06/16/2020   S/P BKA (below knee amputation) unilateral ([place]) 06/08/2020   Ischemic ulcer of toe of right foot ([place]) 04/13/2020   PVD (peripheral vascular disease)/ S/p BKA 04/13/2020   ESRD on hemodialysis ([place]) 01/27/2020   Abdominal wall cellulitis    Panniculitis 12/29/2019   Osteomyelitis ([place]) 10/15/2019   Hyperglycemia due to diabetes mellitus ([place]) 10/15/2019   Osteomyelitis of third toe of right foot ([place]) 10/14/2019   ESRD (  end stage renal disease) ([place])    Asthma, mild persistent 07/25/2015   Hypertension secondary to other renal disorders 07/25/2015   Controlled type 2 diabetes mellitus with complication, with long-term current use of insulin ([place]) 03/30/2015   Anemia of chronic disease 02/17/2015   Osteopathy in diseases classified elsewhere, unspecified site 02/17/2015   Essential (primary) hypertension 06/24/2014   Heart failure ([place]) 06/24/2014   Obesity, Class III, BMI 40-49.9 (morbid obesity) ([place]) 06/24/2014    Conditions to be addressed/monitored: Monitor client management of medical needs. Monitor client management of anxiety and stress issues faced   Care Plan : LCSW Care Plan  Updates made by Katha Cabal, LCSW since 11/06/2020 12:00 AM     Problem: Coping Skills (General Plan of Care)         Start Date: 11/06/2020  Expected End Date: 01/25/2021   This Visit's Progress: On track  Recent Progress: On track  Priority: High  Note:   Current barriers:   Mobility issues (recent BKA (Left) Challenges in managing multiple medical conditions Anxiety and Stress related to functional changes due to recent BKA  Clinical Goals:  patient will work with SW in next 30 days to address concerns related to client management of current medical conditions patient will attend all scheduled medical appointments in next 30 days  as evidenced by patient report and care team review of appointment completion in EMR:  Client will talk with LCSW in next 30 days about Christy resources of assistance for client  Clinical Interventions:  Collaboration with Christy Fraise, MD regarding development and update of comprehensive plan of care as evidenced by provider attestation and co-signature Chart reviewed to assess client needs Talked with daughter of client, Christy Zhang ,about client needs Talked with Christy Zhang about client hospitalization Talked with Christy Zhang about family support for client (daughters are supportive) Provided counseling support for Christy Zhang related to needs of client Talked with Christy Zhang about client care at ICU at Christy Zhang Talked with Christy Zhang about client progress medically [facility] said client came off ventilator today and is now using a mask to help her breath. Client is still receiving care at ICU unit at Christy Zhang) Collaborated  with Christy Zhang about client needs Talked with Christy Zhang about past client dialysis treatments Encouraged Christy Zhang to call RNCM or LCSW as needed for CCM support  Patient Strengths: Has support from daughter, Christy Zhang of client is important to client Client is strong advocate for herself  Patient Deficits: Challenges with Managing Diabetes Challenges with Completing Dialysis Treatment weekly  Patient Goals:   Patient to attend scheduled medical appointments in next 30  days Patient to take medications as prescribed Patient to communicate with RNCM or LCSW as needed for support Patient to communicate regularly with daughter, Christy Zhang, for support Patient to practice self care and allow time to rest and relax -  Follow Up Plan: LCSW to call client on 12/14/20 to assess client needs      Christy Riffle.Brinley Rosete MSW, LCSW Licensed Clinical Social Worker [facility] Care Management [phone]

## 2020-11-06 NOTE — Progress Notes (Signed)
Christy Zhang  Assessment/ Plan: Pt is a 65 y.o. yo female  with history of HTN, HLD, peripheral vascular disease status post bilateral BKA, DM, anemia, ESRD on HD MWF at [facility] presented with cough, shortness of breath and generalized weakness, seen as a consultation for the management of dialysis.  Dialysis Orders from previous admission:  [facility] - MWF 4 hrs 400/500 97 kg 2.0K/2.5 Ca 16g needles -Heparin 1000 units initial bolus-heparin pump 1000 units/hr stop 1 hour prior to end of tx  -Epogen 14000 units IV TIW -Hectorol 1.5 mcg IV TIW  #Septic shock due to left lung pneumonia: Patient was transferred to [facility].  Vent and pressor support per CCM.  Abx per primary service. Ct 7/7 without source of infection -back on pressors now  #Thrombosed left radial artery/left hand ischemia -2/2 previous a line and pressors -s/p left radial artery thrombectomy and angio 7/9 w/ VVS  #Possible inferior STEMI -not a candidate for intervention currently. Per cardiology  #Vent dependent respiratory failure due to both pneumonia and pulm edema.  Antibiotics as above and volume management with CRRT.  # ESRD MWF schedule.  Now on CRRT because of hemodynamic instability.  C/w CRRT.  On IV heparin for anticoagulation.  Discussed with RN, will try for net neg 50cc/hr.  # hypokalemia: K 3.1 this AM, change dialysate to 4K and f/u 4pm lytes.   #A. fib with RVR: Currently on amiodarone and heparin.  Cardiology is following.   # Anemia: Hemoglobin 7.5 today.  Continue to monitor.  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  Off/on pressors in past 24h, currently requiring.  No new  issues.   Objective Vital signs in last 24 hours: Vitals:   11/06/20 0743 11/06/20 0745 11/06/20 0800 11/06/20 0815  BP: (!) 143/42     Pulse: 75 77 77 74  Resp: (!) 24 (!) 24 (!) 24 (!) 24  Temp:   (!) 96.8 F (36 C)   TempSrc:   Esophageal   SpO2: 100% 100% 100% 100%  Weight:      Height:       Weight change: 0.2 kg  Intake/Output Summary (Last 24 hours) at 11/06/2020 0852 Last data filed at 11/06/2020 0800 Gross per 24 hour  Intake 3680.12 ml  Output 3966 ml  Net -285.88 ml        Labs: Basic Metabolic Panel: Recent Labs  Lab 11/05/20 0426 11/05/20 1600 11/06/20 0421  NA 134* 135 136  K 3.8 3.7 3.1*  CL 101 100 104  CO2 24 25 24   GLUCOSE 142* 129* 77  BUN 19 18 17   CREATININE 1.39* 1.37* 1.32*  CALCIUM 8.5* 8.6* 8.2*  PHOS 3.1 2.4* 3.0    Liver Function Tests: Recent Labs  Lab 11/01/20 0308 11/01/20 1600 11/02/20 0341 11/02/20 1638 11/03/20 0401 11/03/20 1545 11/05/20 0426 11/05/20 1600 11/06/20 0421  AST 109*  --  74*  --  87*  84*  --   --   --   --   ALT 142*  --  117*  --  77*  75*  --   --   --   --   ALKPHOS 79  --  93  --  85  84  --   --   --   --   BILITOT 0.8  --  0.8  --  0.8  1.1  --   --   --   --   PROT 6.5  --  7.7  --  7.4  7.1  --   --   --   --   ALBUMIN 2.2*   < > 2.4*   < > 2.1*  2.1*   < > 2.2* 2.2* 2.2*   < > = values in this interval not displayed.    No results for input(s): LIPASE, AMYLASE in the last 168 hours. No results for input(s): AMMONIA in the last 168 hours. CBC: Recent Labs  Lab 11/01/20 0308 11/02/20 0341 11/02/20 0741 11/03/20 0401 11/04/20 0401 11/05/20 0426 11/06/20 0421  WBC 27.1* 29.4*  --  28.2* 23.8* 22.7* 18.8*  NEUTROABS 22.8*  --   --   --   --   --   --   HGB 8.1* 8.9*   < > 7.7* 7.6* 9.5* 7.5*  HCT 27.8* 29.9*   < > 26.1* 25.4* 30.6* 25.3*  MCV 82.5 82.1  --  81.1 81.9 78.5* 80.8  PLT 449* 423*  --  377 375 331 342   < > = values in this interval not displayed.    Cardiac  Enzymes: No results for input(s): CKTOTAL, CKMB, CKMBINDEX, TROPONINI in the last 168 hours. CBG: Recent Labs  Lab 11/05/20 1657 11/05/20 1951 11/05/20 2350 11/06/20 0419 11/06/20 0805  GLUCAP 133* 182* 109* 78 123*     Iron Studies: No results for input(s): IRON, TIBC, TRANSFERRIN, FERRITIN in the last 72 hours. Studies/Results: DG Abd 1 View  Result Date: 11/04/2020 CLINICAL DATA:  Check gastric catheter placement EXAM: ABDOMEN - 1 VIEW COMPARISON:  10/29/2020 FINDINGS: Gastric catheter is noted within the stomach. Diffuse vascular calcifications are seen. No obstructive pattern is noted. IMPRESSION: Gastric catheter within the stomach. Electronically Signed   By: Inez Catalina M.D.   On: 11/04/2020 22:24    Medications: Infusions:   prismasol BGK 4/2.5 500 mL/hr at 11/06/20 0550    prismasol BGK 4/2.5 200 mL/hr at 11/05/20 1321   sodium chloride 10 mL/hr at 11/06/20 0800   feeding supplement (VITAL 1.5 CAL) 1,000 mL (11/04/20 1600)   fentaNYL infusion INTRAVENOUS 75 mcg/hr (11/06/20 0800)   heparin 1,300 Units/hr (11/06/20 0800)   phenylephrine (NEO-SYNEPHRINE) Adult infusion 50 mcg/min (11/06/20 0807)   prismasol BGK 4/2.5      Scheduled Medications:  amiodarone  400 mg Oral TID   aspirin  81 mg Per Tube Daily   atorvastatin  40 mg Per Tube Daily   B-complex with vitamin C  1 tablet Per Tube Daily   chlorhexidine gluconate (MEDLINE KIT)  15 mL Mouth Rinse BID   Chlorhexidine Gluconate Cloth  6 each Topical Q0600   clopidogrel  75 mg Per Tube Daily   darbepoetin (ARANESP) injection - DIALYSIS  100 mcg Intravenous Q Tue-HD   docusate  100 mg Per Tube Daily   feeding supplement (PROSource TF)  45 mL Per Tube QID   fentaNYL (SUBLIMAZE) injection  100 mcg Intravenous Once   fentaNYL (SUBLIMAZE) injection  50 mcg Intravenous Once   insulin aspart  3-9 Units Subcutaneous Q4H   insulin aspart  4 Units Subcutaneous Q4H   insulin glargine  35 Units Subcutaneous Q12H   mouth  rinse  15 mL Mouth Rinse 10  times per day   pantoprazole sodium  40 mg Per Tube Daily   polyethylene glycol  17 g Per Tube BID   sodium chloride flush  10-40 mL Intracatheter Q12H   sorbitol, milk of mag, mineral oil, glycerin (SMOG) enema  960 mL Rectal Once    have reviewed scheduled and prn medications.  Physical Exam: General: Critically ill looking female intubated, awake Heart:RRR, s1s2 nl Lungs: vent, b/l chest expansion Abdomen:soft,  non-distended Extremities: Bilateral BKA, no stump/thigh edema, left hand with several necrotic digits and hand with patchy duskiness but per RN pulses present and exam stable Neuro: awake Dialysis Access: Left IJ temporary HD catheter for CRRT.  AV fistula has + thrill and bruit.  Justin Mend 11/06/2020,8:52 AM  LOS: 10 days

## 2020-11-06 NOTE — Plan of Care (Signed)
Pt was extubated today> doing well, promoting pulmonary toileting and exercising voice, aspirations percautions in place, will have swallow eval tomorrow Problem: Activity: Goal: Ability to tolerate increased activity will improve Outcome: Completed/Met   Problem: Respiratory: Goal: Ability to maintain a clear airway and adequate ventilation will improve Outcome: Completed/Met   Problem: Role Relationship: Goal: Method of communication will improve Outcome: Completed/Met

## 2020-11-06 NOTE — Progress Notes (Signed)
NAME:  Christy Zhang, MRN:  [id number], DOB:  02/08/1956, LOS: 46 ADMISSION DATE:  10/27/2020, CONSULTATION DATE:  7/3 REFERRING MD:  Tyrell Antonio, CHIEF COMPLAINT:  dyspnea   Brief History of Present Illness:  65 y/o female admitted for presumed pneumonia and hypotension, required intubation, mechanical ventilation and CRRT in the setting of septic shock from pneumonia cauting acute respiratory failure with hypoxemia and AKI.  Pertinent  Medical History  CHF CKD T2DM ESRD HLD HTN  Significant Hospital Events: Including procedures, antibiotic start and stop dates in addition to other pertinent events   Admitted to hospital on 7/1 7/1 cefepime >  7/1 flagyl x1 7/1 vancomycin>  Admitted to ICU on 10/29/20 for CRRT 7/3 - brief PEA with bearing down while agitated. 7/3 ETT 7/3 left radial arterial line 7/3 L IJ HD cath 7/5 L pigtail CT placed for L pleural effusion 7/6 CT output -550 cc 7/8 A fib, ST elevation noted per EKG, Concern for ischemia, cards consulted  7/8>> [place] LUE >Occluded radial artery from the mid to distal segment. Ulnar artery patent  with calcification through the mid to distal segments. Appearance of a  non-obstructive lesion in the distal brachial artery.  7/7 >> CT Chest Abdomen  IMPRESSION: 3.5 cm heterogeneously enhancing abnormality is again noted arising from lower pole of left kidney concerning for possible neoplasm. Further evaluation with MRI with and without gadolinium is recommended.   Endotracheal tube is directed slightly into right mainstem bronchus; withdrawal by 2-3 cm is recommended.   Small right pleural effusion is noted with adjacent subsegmental atelectasis of right lower lobe.   Pigtail catheter is noted in left hemithorax. No pneumothorax is noted.   Mild gallbladder distention is now noted. No cholelithiasis or inflammatory changes noted.   Interim History / Subjective:  No events. Awakening as fentanyl is being titrated  down. Able to lift head off bed. L hand stable/improved. Not able to pull much with CVVH due to pressor needs.   Objective   Blood pressure (!) 143/42, pulse 75, temperature (!) 96.8 F (36 C), temperature source Esophageal, resp. rate (!) 24, height '5\' 4"'$  (1.626 m), weight 86.4 kg, SpO2 100 %.    Vent Mode: PRVC FiO2 (%):  [40 %] 40 % Set Rate:  [24 bmp] 24 bmp Vt Set:  [440 mL] 440 mL PEEP:  [5 cmH20] 5 cmH20 Pressure Support:  [14 cmH20] 14 cmH20 Plateau Pressure:  [17 cmH20-21 cmH20] 21 cmH20   Intake/Output Summary (Last 24 hours) at 11/06/2020 0747 Last data filed at 11/06/2020 0700 Gross per 24 hour  Intake 3610.69 ml  Output 3880 ml  Net -269.31 ml    Filed Weights   11/04/20 0500 11/05/20 0615 11/06/20 0615  Weight: 85.3 kg 86.2 kg 86.4 kg    Examination: Constitutional: no distress on vent  Eyes: EOMI, pupils equal Ears, nose, mouth, and throat: ETT in place, minimal secretions Cardiovascular: RRR, BL BKA noted, minimal dependent edema Respiratory: clear, triggering vent Gastrointestinal: soft, NGT in place Skin: No rashes, normal turgor Neurologic: moves everything to command Psychiatric: RASS 0  Chemistries look good on CRRT H/H yesterday erroneous? WBC downtrending CBG okay  Resolved Hospital Problem list     Assessment & Plan:  Acute respiratory failure with hypercarbia and hypoxemia due to severe community acquired pneumonia- Improved Parapneumonic effusion- drained, no recurrence, chest tube out Septic shock due to CAP- improved Muscular weakness- barrier to extubation Constipation- ongoing issues ESRD- nearing euvolemia L radial artery occlusion- post  thrombectomy, some distal ischemia, allow to demarcate, local wound care Leukamoid reaction- think related to hand ischemia, improving Anemia- stable Afib, likely type 2 NSTEMI- not cath candidate presently, echo w/o WMA DM2- with relative hypoglycemia  - As may give shot off vent, will lower  lantus, continue SSI - Will see how wakes up then consider extubation to BIPAP - Not a good trach candidate - Fine to hold CVVH if okay with nephro after cartridge wears out - Strengthen bowel regimen - Continue amio/heparin/DAPT - Neo titrated to MAP 65 - Appreciate continued palliative assistance - No need for further abx from my standpoint, watch fever curve  Best Practice (right click and "Reselect all SmartList Selections" daily)   Diet/type: tubefeeds- hold for potential extubation DVT prophylaxis: systemic heparin GI prophylaxis: PPI Lines: yes and it is still needed Foley:  N/A Code Status:  full code Last date of multidisciplinary goals of care discussion [7/10 see palliative note]  Patient critically ill due to shock, renal failure, resp failure Interventions to address this today CRRT, vent, pressor wean Risk of deterioration without these interventions is high  I personally spent 33 minutes providing critical care not including any separately billable procedures  Erskine Emery MD [facility]  Prefer epic messenger for cross cover needs If after hours, please call E-link

## 2020-11-06 NOTE — Progress Notes (Signed)
ANTICOAGULATION CONSULT NOTE - Follow Up Consult  Pharmacy Consult for heparin Indication: atrial fibrillation   Labs: Recent Labs    11/03/20 0516 11/03/20 0750 11/03/20 1325 11/04/20 0401 11/04/20 0458 11/04/20 1731 11/05/20 0426 11/05/20 1600 11/06/20 0421  HGB  --   --    < > 7.6*  --   --  9.5*  --  7.5*  HCT  --   --   --  25.4*  --   --  30.6*  --  25.3*  PLT  --   --   --  375  --   --  331  --  342  HEPARINUNFRC  --   --    < > 0.42  --   --  0.32  --  <0.10*  CREATININE  --   --    < > 1.49*  --  1.68* 1.39* 1.37*  --   TROPONINIHS 119* 115*  --   --  91*  --   --   --   --    < > = values in this interval not displayed.    Assessment: 64yo female subtherapeutic on heparin after several levels at goal though had been steadily trending down; no gtt issues or signs of bleeding per RN.  Goal of Therapy:  Heparin level 0.3-0.7 units/ml   Plan:  Will increase heparin gtt cautiously by 2-3 units/kg/hr to 1300 units/hr and check level in 8 hours.    Wynona Neat, PharmD, BCPS  11/06/2020,5:12 AM

## 2020-11-06 NOTE — Progress Notes (Signed)
  Progress Note    11/06/2020 7:24 AM 2 Days Post-Opf  Subjective:  intubated;  shakes her head yes when asked if she can feel me touching her left hand.  Able to slightly grip fingers on left hand.   afebrile  Vitals:   11/06/20 0700 11/06/20 0715  BP:    Pulse: 73 75  Resp: (!) 24 (!) 24  Temp:    SpO2: 100% 100%    Physical Exam: Cardiac:  regular Lungs:  intubated on .[id number]  Incisions:  clean with sutures in tact;  right BKA stump unchanged; staple present on posterior side of right BKA stump Extremities:  brisk left radial and palmar arch doppler signals.   CBC    Component Value Date/Time   WBC 18.8 (H) 11/06/2020 0421   RBC 3.13 (L) 11/06/2020 0421   HGB 7.5 (L) 11/06/2020 0421   HGB 11.0 (L) 10/18/2020 1505   HCT 25.3 (L) 11/06/2020 0421   HCT 36.6 10/18/2020 1505   PLT 342 11/06/2020 0421   PLT 495 (H) 10/18/2020 1505   MCV 80.8 11/06/2020 0421   MCV 82 10/18/2020 1505   MCH 24.0 (L) 11/06/2020 0421   MCHC 29.6 (L) 11/06/2020 0421   RDW 22.6 (H) 11/06/2020 0421   RDW 16.4 (H) 10/18/2020 1505   LYMPHSABS 3.0 11/01/2020 0308   LYMPHSABS 1.9 10/18/2020 1505   MONOABS 0.8 11/01/2020 0308   EOSABS 0.3 11/01/2020 0308   EOSABS 0.2 10/18/2020 1505   BASOSABS 0.3 (H) 11/01/2020 0308   BASOSABS 0.1 10/18/2020 1505    BMET    Component Value Date/Time   NA 136 11/06/2020 0421   NA 138 10/18/2020 1505   K 3.1 (L) 11/06/2020 0421   CL 104 11/06/2020 0421   CO2 24 11/06/2020 0421   GLUCOSE 77 11/06/2020 0421   BUN 17 11/06/2020 0421   BUN 13 10/18/2020 1505   CREATININE 1.32 (H) 11/06/2020 0421   CALCIUM 8.2 (L) 11/06/2020 0421   GFRNONAA 45 (L) 11/06/2020 0421   GFRAA 8 (L) 04/06/2020 1113    INR    Component Value Date/Time   INR 1.5 (H) 10/28/2020 0358     Intake/Output Summary (Last 24 hours) at 11/06/2020 0724 Last data filed at 11/06/2020 0700 Gross per 24 hour  Intake 3610.69 ml  Output 3880 ml  Net -269.31 ml      Assessment/Plan:  65 y.o. female is s/p:  left radial thrombectomy and patch angioplasty 11/04/20.  2 Days Post-Op   -pt with brisk doppler signals left radial and palmar arch.  She does have areas of necrosis on the 1st and 5th finger tips.  Ischemic changes to left hand-will need to demarcate.  -DVT prophylaxis:  heparin per pharmacy for afib -continue asa/statin when appropriate   Leontine Locket, PA-C [facility] [phone] 11/06/2020 7:24 AM

## 2020-11-06 NOTE — Progress Notes (Signed)
Per Dr. Tamala Julian, goal SBP > 110.

## 2020-11-06 NOTE — Patient Instructions (Signed)
Visit Information  PATIENT GOALS:  Goals Addressed             This Visit's Progress    Protect My Health;Manage medical conditions faced       Timeframe:  Short-Term Goal Priority:  High Progress: On Track Start Date:      11/06/20         Expected End Date:    01/25/21                Follow Up Date 12/14/20   Protect My Health (Patient). Patient needs to learn coping skills to help her manage medical conditions faced (Has diabetes, has weekly Dialysis treatments, and recently had a Left BKA completed  Why is this important?   Screening tests can find diseases early when they are easier to treat.  Your doctor or nurse will talk with you about which tests are important for you.  Getting shots for common diseases like the flu and shingles will help prevent them.     Patient Strengths: Has support from daughter, Ledon Snare of client is important to client Client is strong advocate for herself  Patient Deficits: Challenges with Managing Diabetes Challenges with Completing Dialysis Treatment weekly  Patient Goals:   Patient to attend scheduled medical appointments in next 30 days Patient to take medications as prescribed in next 30 days Patient to communicate with RNCM or LCSW as needed for support in next 30 days Patient to communicate regularly with daughter, [name], for support in next 30 days Patient to practice self care and allow time to rest and relax in next 30 days -  Follow Up Plan: LCSW to call client on 12/14/20 to assess client needs     Norva Riffle.Rhea Kaelin MSW, LCSW Licensed Clinical Social Worker [facility] Care Management [phone]

## 2020-11-06 NOTE — Procedures (Signed)
Extubation Procedure Note  Patient Details:   Name: HAZLEY COGLIANESE DOB: [birth year] MRN: [id number]   Airway Documentation:    Vent end date: 11/06/20 Vent end time: 1430   Evaluation  O2 sats: stable throughout Complications: No apparent complications Patient did tolerate procedure well. Bilateral Breath Sounds: Clear, Diminished   Yes  Pt extubated to BiPAP 12/5 40% with RN at bedside. Positive cuff leak noted. Pt tolerating well at this time. RT will continue to monitor.  Cathie Olden 11/06/2020, 3:03 PM

## 2020-11-06 NOTE — Progress Notes (Signed)
Cardiology Progress Note  Patient ID: Christy Zhang MRN: [id number] DOB: 1955-11-15 Date of Encounter: 11/06/2020  Primary Cardiologist: None  Subjective   Chief Complaint: Intubated  HPI: Monitor with a flutter overnight.  Intubated.  Pressors being weaned.  ROS:  All other ROS reviewed and negative. Pertinent positives noted in the HPI.     Inpatient Medications  Scheduled Meds:  amiodarone  400 mg Oral TID   aspirin  81 mg Per Tube Daily   atorvastatin  40 mg Per Tube Daily   B-complex with vitamin C  1 tablet Per Tube Daily   chlorhexidine gluconate (MEDLINE KIT)  15 mL Mouth Rinse BID   Chlorhexidine Gluconate Cloth  6 each Topical Q0600   clopidogrel  75 mg Per Tube Daily   darbepoetin (ARANESP) injection - DIALYSIS  100 mcg Intravenous Q Tue-HD   docusate  100 mg Per Tube Daily   feeding supplement (PROSource TF)  45 mL Per Tube QID   fentaNYL (SUBLIMAZE) injection  100 mcg Intravenous Once   fentaNYL (SUBLIMAZE) injection  50 mcg Intravenous Once   insulin aspart  3-9 Units Subcutaneous Q4H   insulin aspart  4 Units Subcutaneous Q4H   insulin glargine  35 Units Subcutaneous Q12H   mouth rinse  15 mL Mouth Rinse 10 times per day   pantoprazole sodium  40 mg Per Tube Daily   polyethylene glycol  17 g Per Tube BID   sodium chloride flush  10-40 mL Intracatheter Q12H   sorbitol, milk of mag, mineral oil, glycerin (SMOG) enema  960 mL Rectal Once   Continuous Infusions:   prismasol BGK 4/2.5 500 mL/hr at 11/06/20 0550    prismasol BGK 4/2.5 200 mL/hr at 11/05/20 1321   sodium chloride 10 mL/hr at 11/06/20 0900   feeding supplement (VITAL 1.5 CAL) 1,000 mL (11/04/20 1600)   fentaNYL infusion INTRAVENOUS Stopped (11/06/20 0846)   heparin 1,300 Units/hr (11/06/20 0900)   phenylephrine (NEO-SYNEPHRINE) Adult infusion 40 mcg/min (11/06/20 0900)   prismasol BGK 4/2.5 1,500 mL/hr at 11/06/20 0906   PRN Meds: sodium chloride, acetaminophen (TYLENOL) oral liquid 160  mg/5 mL, albuterol, alteplase, bisacodyl, heparin, hydrALAZINE, metoprolol tartrate, midazolam, sodium chloride, sodium chloride flush   Vital Signs   Vitals:   11/06/20 0845 11/06/20 0900 11/06/20 0915 11/06/20 0930  BP:      Pulse: 73 74 72 72  Resp: (!) 24 (!) 24 (!) 24 (!) 24  Temp:      TempSrc:      SpO2: 100% 100% 100% 97%  Weight:      Height:        Intake/Output Summary (Last 24 hours) at 11/06/2020 0948 Last data filed at 11/06/2020 0920 Gross per 24 hour  Intake 3863.37 ml  Output 4138 ml  Net -274.63 ml   Last 3 Weights 11/06/2020 11/05/2020 11/04/2020  Weight (lbs) 190 lb 7.6 oz 190 lb 0.6 oz 188 lb 0.8 oz  Weight (kg) 86.4 kg 86.2 kg 85.3 kg      Telemetry  Overnight telemetry shows sinus rhythm, atrial flutter overnight, which I personally reviewed.   ECG  The most recent ECG shows sinus rhythm heart rate 78, inferior lateral T wave inversions noted, which I personally reviewed.   Physical Exam   Vitals:   11/06/20 0845 11/06/20 0900 11/06/20 0915 11/06/20 0930  BP:      Pulse: 73 74 72 72  Resp: (!) 24 (!) 24 (!) 24 (!) 24  Temp:  TempSrc:      SpO2: 100% 100% 100% 97%  Weight:      Height:        Intake/Output Summary (Last 24 hours) at 11/06/2020 0948 Last data filed at 11/06/2020 0920 Gross per 24 hour  Intake 3863.37 ml  Output 4138 ml  Net -274.63 ml    Last 3 Weights 11/06/2020 11/05/2020 11/04/2020  Weight (lbs) 190 lb 7.6 oz 190 lb 0.6 oz 188 lb 0.8 oz  Weight (kg) 86.4 kg 86.2 kg 85.3 kg    Body mass index is 32.7 kg/m.  General: Ill-appearing Head: Atraumatic, normal size  Eyes: PEERLA, EOMI  Neck: Supple, no JVD Endocrine: No thryomegaly Cardiac: Normal S1, S2; RRR; faint 2 out of 6 systolic ejection murmur Lungs: Clear to auscultation bilaterally, no wheezing, rhonchi or rales  Abd: Soft, nontender, no hepatomegaly  Ext: No edema, status post bilateral BKA Musculoskeletal: Bilateral BKA Skin: Warm and dry, no rashes   Neuro:  Intubated on vent  Labs  High Sensitivity Troponin:   Recent Labs  Lab 10/28/20 0358 10/28/20 0552 11/03/20 0516 11/03/20 0750 11/04/20 0458  TROPONINIHS 109* 107* 119* 115* 91*     Cardiac EnzymesNo results for input(s): TROPONINI in the last 168 hours. No results for input(s): TROPIPOC in the last 168 hours.  Chemistry Recent Labs  Lab 11/01/20 0308 11/01/20 1600 11/02/20 0341 11/02/20 0741 11/03/20 0401 11/03/20 1545 11/05/20 0426 11/05/20 1600 11/06/20 0421  NA 134*   < > 131*   < > 134*   < > 134* 135 136  K 4.4   < > 5.2*   < > 5.0   < > 3.8 3.7 3.1*  CL 102   < > 98   < > 102   < > 101 100 104  CO2 23   < > 22   < > 23   < > _0 GLUCOSE 238*   < > 306*   < > 200*   < > 142* 129* 77  BUN 19   < > 21   < > 22   < > _1 CREATININE 1.75*   < > 1.65*   < > 1.68*   < > 1.39* 1.37* 1.32*  CALCIUM 8.1*   < > 8.8*   < > 8.7*   < > 8.5* 8.6* 8.2*  PROT 6.5  --  7.7  --  7.4  7.1  --   --   --   --   ALBUMIN 2.2*   < > 2.4*   < > 2.1*  2.1*   < > 2.2* 2.2* 2.2*  AST 109*  --  74*  --  87*  84*  --   --   --   --   ALT 142*  --  117*  --  77*  75*  --   --   --   --   ALKPHOS 79  --  93  --  85  84  --   --   --   --   BILITOT 0.8  --  0.8  --  0.8  1.1  --   --   --   --   GFRNONAA 32*   < > 35*   < > 34*   < > 43* 43* 45*  ANIONGAP 9   < > 11   < > 9   < > _2 < > =  values in this interval not displayed.    Hematology Recent Labs  Lab 11/04/20 0401 11/05/20 0426 11/06/20 0421  WBC 23.8* 22.7* 18.8*  RBC 3.10* 3.90 3.13*  HGB 7.6* 9.5* 7.5*  HCT 25.4* 30.6* 25.3*  MCV 81.9 78.5* 80.8  MCH 24.5* 24.4* 24.0*  MCHC 29.9* 31.0 29.6*  RDW 21.4* 23.3* 22.6*  PLT 375 331 342   BNP Recent Labs  Lab 11/04/20 0401  BNP 230.6*    DDimer No results for input(s): DDIMER in the last 168 hours.   Radiology  DG Abd 1 View  Result Date: 11/04/2020 CLINICAL DATA:  Check gastric catheter placement EXAM: ABDOMEN - 1 VIEW COMPARISON:  10/29/2020  FINDINGS: Gastric catheter is noted within the stomach. Diffuse vascular calcifications are seen. No obstructive pattern is noted. IMPRESSION: Gastric catheter within the stomach. Electronically Signed   By: Inez Catalina M.D.   On: 11/04/2020 22:24    Cardiac Studies  TTE 11/03/2020  1. Left ventricular ejection fraction, by estimation, is 60 to 65%. The  left ventricle has normal function. The left ventricle has no regional  wall motion abnormalities. There is moderate left ventricular hypertrophy.  Left ventricular diastolic  parameters are indeterminate.   2. Right ventricular systolic function is mildly reduced. The right  ventricular size is normal. Tricuspid regurgitation signal is inadequate  for assessing PA pressure.   3. A small pericardial effusion is present.   4. The mitral valve is normal in structure. No evidence of mitral valve  regurgitation. No evidence of mitral stenosis.   5. The aortic valve is tricuspid. Aortic valve regurgitation is not  visualized. Mild aortic valve sclerosis is present, with no evidence of  aortic valve stenosis.   6. The inferior vena cava is normal in size with <50% respiratory  variability, suggesting right atrial pressure of 8 mmHg.   Patient Profile  Christy Zhang is a 65 y.o. female with ESRD on hemodialysis, PAD status post bilateral BKA, diabetes, hypertension, obesity who was admitted on 10/27/2020 for sepsis and pneumonia.  Cardiology was consulted for new onset atrial fibrillation with RVR as well as elevated troponin.  Course has been complicated by septic shock with prolonged course of pressors.  She also suffered a left radial artery thromboembolic event on 10/05/6293 requiring thrombectomy.  Assessment & Plan   New onset A. fib with RVR/atrial flutter -Has had difficult to control atrial arrhythmias.  Also complicated by septic shock. -For now would continue amiodarone.  Has been transition to p.o. amiodarone.  Anticipate A. fib to  improve once sepsis resolves. -For now we will plan for 400 mg twice daily for 7 days and then 200 mg daily for 21 days and stop.  We may need to extend this depending how her course goes. -She is on a heparin drip.  Transition to DOAC once you are able.  2.  Elevated troponin/demand ischemia -Initially concern for ST elevation.  EKG shows none of that on my review. -Troponin is minimally elevated and flat. -Echocardiogram shows normal LVEF. -No wall motion abnormalities. -She is on heparin for atrial fibrillation. -Continue aspirin and statin. -No plans for left heart catheterization.  This is all secondary.  This is not a primary ACS.  For questions or updates, please contact [place] Please consult [email or web address] for contact info under   Time Spent with Patient: I have spent a total of 35 minutes with patient reviewing hospital notes, telemetry, EKGs, labs and examining the patient as well as  establishing an assessment and plan that was discussed with the patient.  > 50% of time was spent in direct patient care.    Signed, Addison Naegeli. Audie Box, MD, [place]  11/06/2020 9:48 AM

## 2020-11-06 NOTE — Progress Notes (Signed)
ANTICOAGULATION CONSULT NOTE  Pharmacy Consult for Heparin  Indication: atrial fibrillation  Allergies  Allergen Reactions   Sulfa Antibiotics Swelling    Patient Measurements: Height: '5\' 4"'$  (162.6 cm) Weight: 86.4 kg (190 lb 7.6 oz) IBW/kg (Calculated) : 54.7   Vital Signs: Temp: 96.8 F (36 C) (07/11 1200) Temp Source: Esophageal (07/11 1200) BP: 130/30 (07/11 1206) Pulse Rate: 73 (07/11 1345)  Labs: Recent Labs    11/04/20 0401 11/04/20 0458 11/04/20 1731 11/05/20 0426 11/05/20 1600 11/06/20 0421 11/06/20 1301  HGB 7.6*  --   --  9.5*  --  7.5*  --   HCT 25.4*  --   --  30.6*  --  25.3*  --   PLT 375  --   --  331  --  342  --   HEPARINUNFRC 0.42  --   --  0.32  --  <0.10* 0.32  CREATININE 1.49*  --    < > 1.39* 1.37* 1.32*  --   TROPONINIHS  --  91*  --   --   --   --   --    < > = values in this interval not displayed.     Estimated Creatinine Clearance: 46.4 mL/min (A) (by C-G formula based on SCr of 1.32 mg/dL (H)).   Medical History: Past Medical History:  Diagnosis Date   Anemia    Arthritis    Asthma    in the past   CHF (congestive heart failure) ([place])    Diabetes mellitus with complication ([place])    Type 2   End stage renal disease on dialysis ([place])    Gangrene (HCC)    Hyperlipidemia    Hypertension     Assessment: 65 y/o F with respiratory distress/pulmonary edema requiring transfered to the ICU and intubated, started on CRRT (ESRD). She is also noted to be in afib/flutter  > SR on IV amiodarone and pharmacy dosing heparin   Heparin level at goal (0.32) on gtt at 1300 units/hr. Hg= 9.5>>7.5 (was 7.6 on 7/9)   Goal of Therapy:  Heparin level 0.3-0.7 units/ml Monitor platelets by anticoagulation protocol: Yes   Plan:  Continue IV heparin 1300 units/hr. Daily heparin level and CBC.  Hildred Laser, PharmD Clinical Pharmacist **Pharmacist phone directory can now be found on [email or web address] ([email or web address]).  Listed under [place].

## 2020-11-07 DIAGNOSIS — I10 Essential (primary) hypertension: Secondary | ICD-10-CM | POA: Diagnosis not present

## 2020-11-07 DIAGNOSIS — Z515 Encounter for palliative care: Secondary | ICD-10-CM | POA: Diagnosis not present

## 2020-11-07 DIAGNOSIS — I4819 Other persistent atrial fibrillation: Secondary | ICD-10-CM

## 2020-11-07 DIAGNOSIS — I480 Paroxysmal atrial fibrillation: Secondary | ICD-10-CM

## 2020-11-07 DIAGNOSIS — N186 End stage renal disease: Secondary | ICD-10-CM | POA: Diagnosis not present

## 2020-11-07 DIAGNOSIS — J189 Pneumonia, unspecified organism: Secondary | ICD-10-CM | POA: Diagnosis not present

## 2020-11-07 DIAGNOSIS — J9601 Acute respiratory failure with hypoxia: Secondary | ICD-10-CM | POA: Diagnosis not present

## 2020-11-07 DIAGNOSIS — I469 Cardiac arrest, cause unspecified: Secondary | ICD-10-CM | POA: Diagnosis not present

## 2020-11-07 LAB — CBC
HCT: 33.9 % — ABNORMAL LOW (ref 36.0–46.0)
Hemoglobin: 10 g/dL — ABNORMAL LOW (ref 12.0–15.0)
MCH: 24 pg — ABNORMAL LOW (ref 26.0–34.0)
MCHC: 29.5 g/dL — ABNORMAL LOW (ref 30.0–36.0)
MCV: 81.5 fL (ref 80.0–100.0)
Platelets: 455 10*3/uL — ABNORMAL HIGH (ref 150–400)
RBC: 4.16 MIL/uL (ref 3.87–5.11)
RDW: 22.1 % — ABNORMAL HIGH (ref 11.5–15.5)
WBC: 25.9 10*3/uL — ABNORMAL HIGH (ref 4.0–10.5)
nRBC: 0.1 % (ref 0.0–0.2)

## 2020-11-07 LAB — RENAL FUNCTION PANEL
Albumin: 2.4 g/dL — ABNORMAL LOW (ref 3.5–5.0)
Albumin: 2.4 g/dL — ABNORMAL LOW (ref 3.5–5.0)
Anion gap: 8 (ref 5–15)
Anion gap: 6 (ref 5–15)
BUN: 12 mg/dL (ref 8–23)
BUN: 17 mg/dL (ref 8–23)
CO2: 25 mmol/L (ref 22–32)
CO2: 27 mmol/L (ref 22–32)
Calcium: 9 mg/dL (ref 8.9–10.3)
Calcium: 9.4 mg/dL (ref 8.9–10.3)
Chloride: 103 mmol/L (ref 98–111)
Chloride: 104 mmol/L (ref 98–111)
Creatinine, Ser: 1.29 mg/dL — ABNORMAL HIGH (ref 0.44–1.00)
Creatinine, Ser: 1.71 mg/dL — ABNORMAL HIGH (ref 0.44–1.00)
GFR, Estimated: 47 mL/min — ABNORMAL LOW (ref >60)
GFR, Estimated: 33 mL/min — ABNORMAL LOW (ref >60)
Glucose, Bld: 70 mg/dL (ref 70–99)
Glucose, Bld: 60 mg/dL — ABNORMAL LOW (ref 70–99)
Phosphorus: 2.3 mg/dL — ABNORMAL LOW (ref 2.5–4.6)
Phosphorus: 3.4 mg/dL (ref 2.5–4.6)
Potassium: 3.9 mmol/L (ref 3.5–5.1)
Potassium: 4 mmol/L (ref 3.5–5.1)
Sodium: 136 mmol/L (ref 135–145)
Sodium: 137 mmol/L (ref 135–145)

## 2020-11-07 LAB — GLUCOSE, CAPILLARY
Glucose-Capillary: 118 mg/dL — ABNORMAL HIGH (ref 70–99)
Glucose-Capillary: 141 mg/dL — ABNORMAL HIGH (ref 70–99)
Glucose-Capillary: 173 mg/dL — ABNORMAL HIGH (ref 70–99)
Glucose-Capillary: 60 mg/dL — ABNORMAL LOW (ref 70–99)
Glucose-Capillary: 62 mg/dL — ABNORMAL LOW (ref 70–99)
Glucose-Capillary: 65 mg/dL — ABNORMAL LOW (ref 70–99)
Glucose-Capillary: 65 mg/dL — ABNORMAL LOW (ref 70–99)
Glucose-Capillary: 69 mg/dL — ABNORMAL LOW (ref 70–99)
Glucose-Capillary: 79 mg/dL (ref 70–99)
Glucose-Capillary: 88 mg/dL (ref 70–99)
Glucose-Capillary: 89 mg/dL (ref 70–99)

## 2020-11-07 LAB — CULTURE, BLOOD (ROUTINE X 2)
Culture: NO GROWTH
Culture: NO GROWTH

## 2020-11-07 LAB — MAGNESIUM: Magnesium: 2.8 mg/dL — ABNORMAL HIGH (ref 1.7–2.4)

## 2020-11-07 LAB — HEPARIN LEVEL (UNFRACTIONATED): Heparin Unfractionated: 0.53 IU/mL (ref 0.30–0.70)

## 2020-11-07 MED ORDER — DEXTROSE 50 % IV SOLN
12.5000 g | INTRAVENOUS | Status: AC
  Administered 2020-11-07: 12.5 g via INTRAVENOUS

## 2020-11-07 MED ORDER — CHLORHEXIDINE GLUCONATE CLOTH 2 % EX PADS
6.0000 | MEDICATED_PAD | Freq: Every day | CUTANEOUS | Status: DC
  Administered 2020-11-07 – 2020-11-14 (×7): 6 via TOPICAL

## 2020-11-07 MED ORDER — DEXTROSE 50 % IV SOLN
INTRAVENOUS | Status: AC
  Filled 2020-11-07: qty 50

## 2020-11-07 MED ORDER — INSULIN GLARGINE 100 UNIT/ML ~~LOC~~ SOLN
10.0000 [IU] | Freq: Two times a day (BID) | SUBCUTANEOUS | Status: DC
  Administered 2020-11-07: 10 [IU] via SUBCUTANEOUS
  Filled 2020-11-07 (×2): qty 0.1

## 2020-11-07 MED ORDER — GERHARDT'S BUTT CREAM
TOPICAL_CREAM | CUTANEOUS | Status: DC | PRN
  Filled 2020-11-07: qty 1

## 2020-11-07 MED ORDER — DEXTROSE 5 % IV SOLN: INTRAVENOUS | Status: DC

## 2020-11-07 NOTE — Progress Notes (Signed)
Patient did not pass swallow eval with SLP. Okay to give essential p.o. meds crushed in applesauce per Dr. Tamala Julian. Amiodarone, aspirin, and Plavix given.

## 2020-11-07 NOTE — Progress Notes (Signed)
[facility] [place] PROGRESS NOTE  ASSESSMENT / PLAN: Christy Zhang is a 65 y.o. female with ischemic left hand after radial artery cannulation. Status post thrombectomy and patch angioplasty of left radial artery 11/04/20. Clinically improving. Extubated today. Good doppler flow in left hand. Continue local care to the hand. Would involve hand surgery once medically stabilized to discuss partial finger amputations. Will check back in 11/10/20 to remove sutures. Please call for questions in interim.  SUBJECTIVE: Extubated. Looks good. Hand unchanged per bedside RN.  OBJECTIVE: BP (!) 123/36   Pulse 74   Temp 97.6 F (36.4 C) (Axillary)   Resp 16   Ht '5\' 4"'$  (1.626 m)   Wt 84.7 kg   LMP  (LMP Unknown)   SpO2 100%   BMI 32.05 kg/m   Intake/Output Summary (Last 24 hours) at 11/07/2020 0814 Last data filed at 11/07/2020 0800 Gross per 24 hour  Intake 1462.84 ml  Output 2798 ml  Net -1335.16 ml    Constitutional: Critically ill, but improved. No acute distress. CNS: sensation in dorsal / palmar hand. Limited flex/extension in MCPs.  Cardiac: RRR. Pulmonary: unlabored. Extubated. Abdomen: soft. Obese. Vascular: good doppler flow in L hand. Incision clean and dry.  CBC Latest Ref Rng & Units 11/07/2020 11/06/2020 11/06/2020  WBC 4.0 - 10.5 K/uL 25.9(H) - 18.8(H)  Hemoglobin 12.0 - 15.0 g/dL 10.0(L) 11.6(L) 7.5(L)  Hematocrit 36.0 - 46.0 % 33.9(L) 34.0(L) 25.3(L)  Platelets 150 - 400 K/uL 455(H) - 342     CMP Latest Ref Rng & Units 11/07/2020 11/06/2020 11/06/2020  Glucose 70 - 99 mg/dL 70 - 82  BUN 8 - 23 mg/dL 12 - 16  Creatinine 0.44 - 1.00 mg/dL 1.29(H) - 1.25(H)  Sodium 135 - 145 mmol/L 136 137 136  Potassium 3.5 - 5.1 mmol/L 3.9 3.9 3.7  Chloride 98 - 111 mmol/L 103 - 101  CO2 22 - 32 mmol/L 25 - 26  Calcium 8.9 - 10.3 mg/dL 9.0 - 8.8(L)  Total Protein 6.5 - 8.1 g/dL - - -  Total Bilirubin 0.3 - 1.2 mg/dL - - -  Alkaline Phos 38 - 126 U/L - - -   AST 15 - 41 U/L - - -  ALT 0 - 44 U/L - - -    Estimated Creatinine Clearance: 47 mL/min (A) (by C-G formula based on SCr of 1.29 mg/dL (H)).  Yevonne Aline. Stanford Breed, MD [facility] Phone Number: [phone] 11/07/2020 8:14 AM

## 2020-11-07 NOTE — Progress Notes (Signed)
Hypoglycemic Event  CBG: 62  Treatment: D50 25 mL (12.5 gm)  Symptoms: None  Follow-up CBG: Time:0522 CBG Result:141  Possible Reasons for Event: Inadequate meal intake  Comments/MD notified: protocol followed    Christy Zhang

## 2020-11-07 NOTE — Progress Notes (Signed)
Progress Note  Patient Name: Christy Zhang Date of Encounter: 11/07/2020  [facility] Cardiologist: None   Subjective   Feeling well.  Happy to be extubated.   Inpatient Medications    Scheduled Meds:  amiodarone  400 mg Oral BID   Followed by   [name] ON 11/13/2020] amiodarone  200 mg Oral Daily   aspirin  81 mg Per Tube Daily   atorvastatin  40 mg Per Tube Daily   B-complex with vitamin C  1 tablet Per Tube Daily   chlorhexidine  15 mL Mouth Rinse BID   Chlorhexidine Gluconate Cloth  6 each Topical Q0600   clopidogrel  75 mg Per Tube Daily   darbepoetin (ARANESP) injection - DIALYSIS  100 mcg Intravenous Q Tue-HD   docusate  100 mg Per Tube Daily   feeding supplement (PROSource TF)  45 mL Per Tube QID   insulin aspart  3-9 Units Subcutaneous Q4H   insulin glargine  20 Units Subcutaneous Q12H   mouth rinse  15 mL Mouth Rinse q12n4p   pantoprazole sodium  40 mg Per Tube Daily   polyethylene glycol  17 g Per Tube BID   sodium chloride flush  10-40 mL Intracatheter Q12H   Continuous Infusions:   prismasol BGK 4/2.5 500 mL/hr at 11/07/20 0239    prismasol BGK 4/2.5 200 mL/hr at 11/06/20 1258   sodium chloride 10 mL/hr at 11/07/20 0800   feeding supplement (VITAL 1.5 CAL) Stopped (11/06/20 1435)   heparin 1,300 Units/hr (11/07/20 0800)   phenylephrine (NEO-SYNEPHRINE) Adult infusion Stopped (11/06/20 1722)   prismasol BGK 4/2.5 1,500 mL/hr at 11/07/20 0525   PRN Meds: sodium chloride, acetaminophen (TYLENOL) oral liquid 160 mg/5 mL, albuterol, alteplase, bisacodyl, heparin, hydrALAZINE, metoprolol tartrate, sodium chloride, sodium chloride flush   Vital Signs    Vitals:   11/07/20 0630 11/07/20 0700 11/07/20 0747 11/07/20 0800  BP:   (!) 123/36   Pulse: 76 74 73 74  Resp: '16 18 17 16  '$ Temp:  97.6 F (36.4 C)    TempSrc:  Axillary    SpO2: 100% 100% 100% 100%  Weight:      Height:        Intake/Output Summary (Last 24 hours) at 11/07/2020 0809 Last data  filed at 11/07/2020 0800 Gross per 24 hour  Intake 1462.84 ml  Output 2798 ml  Net -1335.16 ml   Last 3 Weights 11/07/2020 11/06/2020 11/05/2020  Weight (lbs) 186 lb 11.7 oz 190 lb 7.6 oz 190 lb 0.6 oz  Weight (kg) 84.7 kg 86.4 kg 86.2 kg      Telemetry    Sinus rhythm.  PVCs. - Personally Reviewed  ECG    N/a - Personally Reviewed  Physical Exam   VS:  BP (!) 123/36   Pulse 67   Temp 97.6 F (36.4 C) (Axillary)   Resp (!) 21   Ht '5\' 4"'$  (1.626 m)   Wt 84.7 kg   LMP  (LMP Unknown)   SpO2 100%   BMI 32.05 kg/m  , BMI Body mass index is 32.05 kg/m. GENERAL:  Chronically ill-appearing.  No acute distress HEENT: Pupils equal round and reactive, fundi not visualized, oral mucosa unremarkable NECK:  No jugular venous distention, waveform within normal limits, carotid upstroke brisk and symmetric, no bruits LUNGS:  Clear to auscultation bilaterally HEART:  RRR.  PMI not displaced or sustained,S1 and S2 within normal limits, no S3, no S4, no clicks, no rubs, no murmurs ABD:  Flat,  positive bowel sounds normal in frequency in pitch, no bruits, no rebound, no guarding, no midline pulsatile mass, no hepatomegaly, no splenomegaly EXT:  no edema, no cyanosis no clubbing.  Bilateral BKA. SKIN:  No rashes no nodules NEURO:  Cranial nerves II through XII grossly intact, motor grossly intact throughout [facility]:  Cognitively intact, oriented to person place and time   Labs    High Sensitivity Troponin:   Recent Labs  Lab 10/28/20 0358 10/28/20 0552 11/03/20 0516 11/03/20 0750 11/04/20 0458  TROPONINIHS 109* 107* 119* 115* 91*      Chemistry Recent Labs  Lab 11/01/20 0308 11/01/20 1600 11/02/20 0341 11/02/20 0741 11/03/20 0401 11/03/20 1545 11/06/20 0421 11/06/20 1606 11/06/20 1643 11/07/20 0455  NA 134*   < > 131*   < > 134*   < > 136 136 137 136  K 4.4   < > 5.2*   < > 5.0   < > 3.1* 3.7 3.9 3.9  CL 102   < > 98   < > 102   < > 104 101  --  103  CO2 23   < > 22   < >  23   < > 24 26  --  25  GLUCOSE 238*   < > 306*   < > 200*   < > 77 82  --  70  BUN 19   < > 21   < > 22   < > 17 16  --  12  CREATININE 1.75*   < > 1.65*   < > 1.68*   < > 1.32* 1.25*  --  1.29*  CALCIUM 8.1*   < > 8.8*   < > 8.7*   < > 8.2* 8.8*  --  9.0  PROT 6.5  --  7.7  --  7.4  7.1  --   --   --   --   --   ALBUMIN 2.2*   < > 2.4*   < > 2.1*  2.1*   < > 2.2* 2.4*  --  2.4*  AST 109*  --  74*  --  87*  84*  --   --   --   --   --   ALT 142*  --  117*  --  77*  75*  --   --   --   --   --   ALKPHOS 79  --  93  --  85  84  --   --   --   --   --   BILITOT 0.8  --  0.8  --  0.8  1.1  --   --   --   --   --   GFRNONAA 32*   < > 35*   < > 34*   < > 45* 48*  --  47*  ANIONGAP 9   < > 11   < > 9   < > 8 9  --  8   < > = values in this interval not displayed.     Hematology Recent Labs  Lab 11/05/20 0426 11/06/20 0421 11/06/20 1643 11/07/20 0455  WBC 22.7* 18.8*  --  25.9*  RBC 3.90 3.13*  --  4.16  HGB 9.5* 7.5* 11.6* 10.0*  HCT 30.6* 25.3* 34.0* 33.9*  MCV 78.5* 80.8  --  81.5  MCH 24.4* 24.0*  --  24.0*  MCHC 31.0 29.6*  --  29.5*  RDW 23.3* 22.6*  --  22.1*  PLT 331 342  --  455*    BNP Recent Labs  Lab 11/04/20 0401  BNP 230.6*     DDimer No results for input(s): DDIMER in the last 168 hours.   Radiology    No results found.  Cardiac Studies   Echo 11/03/20:  1. Left ventricular ejection fraction, by estimation, is 60 to 65%. The  left ventricle has normal function. The left ventricle has no regional  wall motion abnormalities. There is moderate left ventricular hypertrophy.  Left ventricular diastolic  parameters are indeterminate.   2. Right ventricular systolic function is mildly reduced. The right  ventricular size is normal. Tricuspid regurgitation signal is inadequate  for assessing PA pressure.   3. A small pericardial effusion is present.   4. The mitral valve is normal in structure. No evidence of mitral valve  regurgitation. No evidence of  mitral stenosis.   5. The aortic valve is tricuspid. Aortic valve regurgitation is not  visualized. Mild aortic valve sclerosis is present, with no evidence of  aortic valve stenosis.   6. The inferior vena cava is normal in size with <50% respiratory  variability, suggesting right atrial pressure of 8 mmHg.   Patient Profile     65 y.o. female with ESRD on HD, PAD status post bilateral BKA, diabetes, hypertension, and obesity admitted with sepsis secondary to pneumonia.  Cardiology consulted for new onset atrial fibrillation with RVR and elevated troponin.  Assessment & Plan    # New onset atrial fibrillation/flutter: Transitioned from IV to oral amiodarone '400mg'$  bid then '200mg'$  daily starting 7/18.  Would plan for 30 days of amiodarone.  She is on IV heparin.  Transition to Eliquis once she is on cleared by speech.  Continue anticoagulation long term due to risk of recurrent atrial fibrillation.    # Elevated troponin: Demand ischemia in the setting of sepsis.  HS troponin was elevated to 119 and relatively flat.  Systolic function preserved on echo.  No ischemic evaluation planned.   # Septic shock: Resolving.  Pressors weaned on 7/11.  # Hyperlipidemia:  Continue atorvastatin.      For questions or updates, please contact [place] Please consult [email or web address] for contact info under        Signed, Skeet Latch, MD  11/07/2020, 8:09 AM

## 2020-11-07 NOTE — Progress Notes (Signed)
Daily Progress Note   Patient Name: Christy Zhang       Date: 11/07/2020 DOB: 1955-09-21  Age: 65 y.o. MRN#: [id number] Attending Physician: Candee Furbish, MD Primary Care Physician: Claretta Fraise, MD Admit Date: 10/27/2020 Length of Stay: 11 days  Reason for Consultation/Follow-up: Establishing goals of care  HPI/Patient Profile:  65 y.o. female  with past medical history of HTN, HLD, peripheral vascular disease status post bilateral BKA, DM, anemia, ESRD on HD MWF at [facility] admitted on 10/27/2020 with cough, shortness of breath, and generalized weakness. She was admitted for pneumonia.  She developed septic shock and hypotensions and was supsequently admitted to the ICU for CRRT given hemodynamic instability. She had a brief PEA Arrest on 7/3 and presumed MI/STEMI on 7/8, now felt to be demand ischemia per cardiology.    She had also lost left radial pulse due to radial artery occlusion and her fingers are dusky and mottled. Went to OR with [facility] who repaired the artery. 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 NGT was placed. Plan for continued dialysis either CRRT vs. HD (depending on hemodynamic progress).  Today she is now extubated and off all pressors.  They are discontinuing CRRT in favor of bedside hemodialysis due to improved hemodynamic stability.  Current Medications: Scheduled Meds:   amiodarone  400 mg Oral BID   Followed by   [name] ON 11/13/2020] amiodarone  200 mg Oral Daily   aspirin  81 mg Per Tube Daily   atorvastatin  40 mg Per Tube Daily   B-complex with vitamin C  1 tablet Per Tube Daily   chlorhexidine  15 mL Mouth Rinse BID   Chlorhexidine Gluconate Cloth  6 each Topical Q0600   clopidogrel  75 mg Per Tube Daily   darbepoetin (ARANESP) injection - DIALYSIS  100 mcg Intravenous Q Tue-HD   docusate  100 mg Per Tube Daily   feeding supplement (PROSource TF)  45 mL Per Tube QID   fentaNYL (SUBLIMAZE) injection  100 mcg Intravenous  Once   fentaNYL (SUBLIMAZE) injection  50 mcg Intravenous Once   insulin aspart  3-9 Units Subcutaneous Q4H   insulin glargine  20 Units Subcutaneous Q12H   mouth rinse  15 mL Mouth Rinse q12n4p   pantoprazole sodium  40 mg Per Tube Daily   polyethylene glycol  17 g Per Tube BID   sodium chloride flush  10-40 mL Intracatheter Q12H    Continuous Infusions:   prismasol BGK 4/2.5 500 mL/hr at 11/07/20 0239    prismasol BGK 4/2.5 200 mL/hr at 11/06/20 1258   sodium chloride 10 mL/hr at 11/07/20 0800   feeding supplement (VITAL 1.5 CAL) Stopped (11/06/20 1435)   heparin 1,300 Units/hr (11/07/20 0800)   phenylephrine (NEO-SYNEPHRINE) Adult infusion Stopped (11/06/20 1722)   prismasol BGK 4/2.5 1,500 mL/hr at 11/07/20 0525    PRN Meds: sodium chloride, acetaminophen (TYLENOL) oral liquid 160 mg/5 mL, albuterol, alteplase, bisacodyl, heparin, hydrALAZINE, metoprolol tartrate, sodium chloride, sodium chloride flush  Subjective:   Subjective: Chart Reviewed. Updates received. Patient Assessed. Created space and opportunity for patient  and family to explore thoughts and feelings regarding current medical situation.  Today's Discussion: Met with the patient in her room.  She is now extubated, weak voice, weak cough.  She is able to answer questions appropriately.  States she has limited memory of the past several days.  We filled her in on clinical details and how sick she is/was.  She seemed understand what we are saying.  We discussed the great strides she has made such as no longer being on the ventilator, no longer on pressors, removing CRRT for hemodialysis based on her previous schedule.  We discussed that her daughter should be needed to visit with her and the seem to make her happy.  She denies pain in general, does have sensation to her hand.  No other concerns have been verbalized.  Review of Systems  Constitutional:        Denies pain in general  Respiratory:  Negative for shortness  of breath.   Skin:  Positive for color change (Left hand/thumb).   Objective:   Vital Signs: BP (!) 123/36   Pulse 74   Temp 97.6 F (36.4 C) (Axillary)   Resp 16   Ht 5' 4"  (1.626 m)   Wt 84.7 kg   LMP  (LMP Unknown)   SpO2 100%   BMI 32.05 kg/m  SpO2: SpO2: 100 % O2 Device: O2 Device: Nasal Cannula O2 Flow Rate: O2 Flow Rate (L/min): 3 L/min  Physical Exam: Physical Exam Vitals and nursing note reviewed.  Constitutional:      General: She is not in acute distress.    Appearance: She is ill-appearing.  HENT:     Head: Normocephalic and atraumatic.  Cardiovascular:     Rate and Rhythm: Normal rate and regular rhythm.     Heart sounds: Normal heart sounds.  Pulmonary:     Effort: Pulmonary effort is normal. No respiratory distress.     Breath sounds: Normal breath sounds.  Abdominal:     Palpations: Abdomen is soft.     Tenderness: There is no abdominal tenderness.  Skin:    General: Skin is warm and dry.     Comments: Skin to left thumb purple but with sensation. Noted surgical incision left wrist  Neurological:     Mental Status: She is alert.    SpO2: SpO2: 100 % O2 Device:SpO2: 100 % O2 Flow Rate: .O2 Flow Rate (L/min): 3 L/min  IO: Intake/output summary:  Intake/Output Summary (Last 24 hours) at 11/07/2020 0803 Last data filed at 11/07/2020 0800 Gross per 24 hour  Intake 1462.84 ml  Output 2798 ml  Net -1335.16 ml    LBM: Last BM Date: 11/07/20 Baseline Weight: Weight: 94 kg Most recent weight: Weight: 84.7 kg   Palliative Assessment/Data: 30%   Assessment & Plan:   Impression:  Very sick 65 year old female with multiple comorbidities now with likely pneumonia sepsis, incident of myocardial demand ischemia, occluded left radial artery status postsurgical repair, now s/p extubation and aware, follows commands, answers questions. CRRT being discontinued at this time. Plans for HD moving forward now that she's more hemodynamically stable and off  pressors.    Tenuous situation with uncertain prognosis.  Watchful waiting will be key. We made decisions on CODE STATUS.  We agreed on the importance of continued and ongoing conversations for further decisions, as needed.  SUMMARY OF RECOMMENDATIONS   Continue discussion on further goals of care decisions Hope/pray for the best plan for the worst Continue to treat the treatable Continue with even aggressive measures including CRRT or hemodialysis, vent support, Continued PMT support  Code Status: Limited code  Symptom Management: No palliative symptom management needs identified at this time We are available as needed for symptom management  Prognosis: Unable to determine  Discharge Planning: To Be Determined  Thank you for allowing [place] to participate in the care of [facility]  B Stewart PMT will continue to support holistically.  Time Total: 45 mins  Visit consisted of counseling and education dealing with the complex and emotionally intense issues of symptom management and palliative care in the setting of serious and potentially life-threatening illness. Greater than 50%  of this time was spent counseling and coordinating care related to the above assessment and plan.  Walden Field, NP Palliative Medicine Team  Team Phone # [phone] (Nights/Weekends)  11/07/2020, 8:03 AM

## 2020-11-07 NOTE — Progress Notes (Addendum)
NAME:  Christy Zhang, MRN:  [id number], DOB:  1956/01/04, LOS: 10 ADMISSION DATE:  10/27/2020, CONSULTATION DATE:  7/3 REFERRING MD:  Tyrell Antonio, CHIEF COMPLAINT:  dyspnea   Brief History of Present Illness:  65 y/o female admitted for presumed pneumonia and hypotension, required intubation, mechanical ventilation and CRRT in the setting of septic shock from pneumonia cauting acute respiratory failure with hypoxemia and AKI.  Pertinent  Medical History  CHF CKD T2DM ESRD HLD HTN  Significant Hospital Events: Including procedures, antibiotic start and stop dates in addition to other pertinent events   Admitted to hospital on 7/1 7/1 cefepime >  7/1 flagyl x1 7/1 vancomycin>  Admitted to ICU on 10/29/20 for CRRT 7/3 - brief PEA with bearing down while agitated. 7/3 ETT 7/3 left radial arterial line 7/3 L IJ HD cath 7/5 L pigtail CT placed for L pleural effusion 7/6 CT output -550 cc 7/8 A fib, ST elevation noted per EKG, Concern for ischemia, cards consulted 7/12 extubated  7/8>> [place] LUE >Occluded radial artery from the mid to distal segment. Ulnar artery patent  with calcification through the mid to distal segments. Appearance of a  non-obstructive lesion in the distal brachial artery.  7/7 >> CT Chest Abdomen  IMPRESSION: 3.5 cm heterogeneously enhancing abnormality is again noted arising from lower pole of left kidney concerning for possible neoplasm. Further evaluation with MRI with and without gadolinium is recommended.   Endotracheal tube is directed slightly into right mainstem bronchus; withdrawal by 2-3 cm is recommended.   Small right pleural effusion is noted with adjacent subsegmental atelectasis of right lower lobe.   Pigtail catheter is noted in left hemithorax. No pneumothorax is noted.   Mild gallbladder distention is now noted. No cholelithiasis or inflammatory changes noted.   Interim History / Subjective:  Wore bipap most of night, did not seem to  like it. Pressors off, titrating to SBP 110 Remains on CRRT with neg pull.  Objective   Blood pressure (!) 123/36, pulse 74, temperature 97.6 F (36.4 C), temperature source Axillary, resp. rate 16, height '5\' 4"'$  (1.626 m), weight 84.7 kg, SpO2 100 %.    Vent Mode: BIPAP;Stand-by FiO2 (%):  [40 %] 40 % Set Rate:  [8 bmp-24 bmp] 8 bmp Vt Set:  [440 mL] 440 mL PEEP:  [5 cmH20] 5 cmH20 Pressure Support:  [14 cmH20] 14 cmH20 Plateau Pressure:  [23 cmH20] 23 cmH20   Intake/Output Summary (Last 24 hours) at 11/07/2020 0804 Last data filed at 11/07/2020 0800 Gross per 24 hour  Intake 1462.84 ml  Output 2798 ml  Net -1335.16 ml    Filed Weights   11/05/20 0615 11/06/20 0615 11/07/20 0500  Weight: 86.2 kg 86.4 kg 84.7 kg    Examination: Constitutional: no distress lying in bed  Eyes: EOMI, pupils equal Ears, nose, mouth, and throat: malampatti 4, trachea midline Cardiovascular: RRR, bl BKA, ext warm Respiratory: shallow inspiratory efforts, no wheezing Gastrointestinal: soft, had BM! Skin: left hand ischemic demarcation stable Neurologic: moves all 4 ext, weak Psychiatric: RASS 0   Resolved Hospital Problem list     Assessment & Plan:  Acute respiratory failure with hypercarbia and hypoxemia due to severe community acquired pneumonia- Improved, off vent Likely sleep disordered breathing- probably would benefit from sleep eval as OP Parapneumonic effusion- drained, no recurrence, chest tube out Septic shock due to CAP- resolved Muscular weakness- biggest remaining issue Constipation- improved ESRD- nearing euvolemia L radial artery occlusion- post thrombectomy, some distal ischemia, allow  to demarcate, local wound care Leukamoid reaction- up again, multiple infectious studies negative, watch closely Anemia- stable Afib, likely type 2 NSTEMI- not cath candidate presently, echo w/o WMA DM2- with relative hypoglycemia due to lack of PO  - Lower lantus again, will need to  crank up once PO starts - Hopefully can get CRRT off today - Continue strengthened bowel regimen - Continue amio/heparin/DAPT - Watch WBC count, fever curve off CRRT - Monitor for neo needs, off so far - Appreciate continued palliative assistance - PT/OT - SLP to eval, suspect will need cortrak  Best Practice (right click and "Reselect all SmartList Selections" daily)   Diet/type: off pending slp eval DVT prophylaxis: systemic heparin GI prophylaxis: PPI Lines: yes and it is still needed Foley:  N/A Code Status:  okay with intubation, no shocks/compressions Last date of multidisciplinary goals of care discussion [7/10 see palliative note]  Erskine Emery MD [facility]  Prefer epic messenger for cross cover needs If after hours, please call E-link

## 2020-11-07 NOTE — Evaluation (Signed)
Clinical/Bedside Swallow Evaluation Patient Details  Name: Christy Zhang MRN: [id number] Date of Birth: Jul 19, 1955  Today's Date: 11/07/2020 Time: SLP Start Time (ACUTE ONLY): 24 SLP Stop Time (ACUTE ONLY): 0940 SLP Time Calculation (min) (ACUTE ONLY): 20 min  Past Medical History:  Past Medical History:  Diagnosis Date   Anemia    Arthritis    Asthma    in the past   CHF (congestive heart failure) ([place])    Diabetes mellitus with complication ([place])    Type 2   End stage renal disease on dialysis ([place])    Gangrene ([place])    Hyperlipidemia    Hypertension    Past Surgical History:  Past Surgical History:  Procedure Laterality Date    LEFT BELOW KNEE AMPUTATION (Left Knee)  06/08/2020   A/V FISTULAGRAM Right 02/04/2017   Procedure: A/V Fistulagram;  Surgeon: Waynetta Sandy, MD;  Location: [facility];  Service: Cardiovascular;  Laterality: Right;   ABDOMINAL AORTOGRAM W/LOWER EXTREMITY N/A 11/18/2019   Procedure: ABDOMINAL AORTOGRAM W/LOWER EXTREMITY;  Surgeon: [name], MD;  Location: [facility];  Service: Cardiovascular;  Laterality: N/A;   ABDOMINAL AORTOGRAM W/LOWER EXTREMITY N/A 02/23/2020   Procedure: ABDOMINAL AORTOGRAM W/LOWER EXTREMITY;  Surgeon: [name], MD;  Location: [facility];  Service: Cardiovascular;  Laterality: N/A;  Bilateral   ABDOMINAL AORTOGRAM W/LOWER EXTREMITY N/A 08/10/2020   Procedure: ABDOMINAL AORTOGRAM W/LOWER EXTREMITY;  Surgeon: [name], MD;  Location: [facility];  Service: Cardiovascular;  Laterality: N/A;   AMPUTATION Left 06/08/2020   Procedure: LEFT BELOW KNEE AMPUTATION;  Surgeon: Cherre Robins, MD;  Location: [facility];  Service: Vascular;  Laterality: Left;   AMPUTATION Right 09/14/2020   Procedure: RIGHT BELOW KNEE AMPUTATION;  Surgeon: Waynetta Sandy, MD;  Location: [facility];  Service: Vascular;  Laterality: Right;   AV FISTULA PLACEMENT Right 08/25/2014    Procedure: RIGHT BRACHIOCEPHALIC ARTERIOVENOUS (AV) FISTULA CREATION;  Surgeon: Mal Misty, MD;  Location: [facility];  Service: Vascular;  Laterality: Right;   AV FISTULA PLACEMENT Right 03/18/2019   Procedure: INSERTION OF ARTERIOVENOUS (AV) GORE-TEX GRAFT RIGHT ARM;  Surgeon: Serafina Mitchell, MD;  Location: [facility];  Service: Vascular;  Laterality: Right;   [place] Right 12/24/2018   Procedure: FIRST STAGE BASILIC VEIN TRANSPOSITION RIGHT UPPER ARM;  Surgeon: Serafina Mitchell, MD;  Location: [facility];  Service: Vascular;  Laterality: Right;   FISTULA SUPERFICIALIZATION Right 11/03/2014   Procedure: RIGHT UPPER ARM FISTULA SUPERFICIALIZATION;  Surgeon: Mal Misty, MD;  Location: [facility];  Service: Vascular;  Laterality: Right;   FISTULOGRAM Right 01/16/2016   Procedure: RIGHT UPPER EXTREMITY ARTERIOVENOUS FISTULOGRAM WITH BALLOON ANGIOPLASTY;  Surgeon: Vickie Epley, MD;  Location: [facility];  Service: Vascular;  Laterality: Right;   INSERTION OF DIALYSIS CATHETER     X4   IR FLUORO GUIDE CV LINE RIGHT  12/03/2018   IR REMOVAL TUN CV CATH W/O FL  07/06/2019   IR THROMBECTOMY AV FISTULA W/THROMBOLYSIS/PTA INC/SHUNT/IMG RIGHT Right 12/03/2018   IR THROMBECTOMY AV FISTULA W/THROMBOLYSIS/PTA/STENT INC/SHUNT/IMG RT Right 10/11/2019   IR US GUIDE VASC ACCESS RIGHT  12/03/2018   IR US GUIDE VASC ACCESS RIGHT  12/03/2018   IR US GUIDE VASC ACCESS RIGHT  10/14/2019   LIGATION OF COMPETING BRANCHES OF ARTERIOVENOUS FISTULA Right 11/03/2014   Procedure: LIGATION OF COMPETING BRANCHE x1 OF RIGHT UPPER ARM ARTERIOVENOUS FISTULA;  Surgeon: Mal Misty, MD;  [facility];  Service: Vascular;  Laterality: Right;   PERIPHERAL VASCULAR BALLOON ANGIOPLASTY Right 02/04/2017   Procedure: PERIPHERAL VASCULAR BALLOON ANGIOPLASTY;  Surgeon: Waynetta Sandy, MD;  Location: [facility];  Service: Cardiovascular;  Laterality: Right;   PERIPHERAL VASCULAR BALLOON ANGIOPLASTY Right 11/18/2019    Procedure: PERIPHERAL VASCULAR BALLOON ANGIOPLASTY;  Surgeon: [name], MD;  Location: [facility];  Service: Cardiovascular;  Laterality: Right;  anterior tibial   PERIPHERAL VASCULAR BALLOON ANGIOPLASTY  02/23/2020   Procedure: PERIPHERAL VASCULAR BALLOON ANGIOPLASTY;  Surgeon: [name], MD;  Location: [facility];  Service: Cardiovascular;;  Lt  AT   PERIPHERAL VASCULAR INTERVENTION Right 08/10/2020   Procedure: PERIPHERAL VASCULAR INTERVENTION;  Surgeon: [name], MD;  Location: [facility];  Service: Cardiovascular;  Laterality: Right;   THROMBECTOMY BRACHIAL ARTERY Left 11/04/2020   Procedure: THROMBECTOMY RADIAL ARTERY with [place]ov[place] Patching.;  Surgeon: Cherre Robins, MD;  Location: [facility];  Service: Vascular;  Laterality: Left;   TRANSMETATARSAL AMPUTATION Left 02/24/2020   Procedure: TRANSMETATARSAL AMPUTATION LEFT;  Surgeon: Cherre Robins, MD;  Location: [facility];  Service: Vascular;  Laterality: Left;   TRANSMETATARSAL AMPUTATION Right 08/11/2020   Procedure: RIGHT TRANSMETATARSAL AMPUTATION;  Surgeon: Waynetta Sandy, MD;  Location: [facility];  Service: Vascular;  Laterality: Right;   TUBAL LIGATION     HPI:  Pt is a 66 yo female presented 7/1 with septic shock from PNA, requiring intubation (7/3-7/11) and CRRT (started 7/3). Brief PEA 7/3. PMH includes: CHF, CKD, T2DM, ESRD, HLD, HTN   Assessment / Plan / Recommendation Clinical Impression  Pt has signs of a post-extubation dysphagia after prolonged intubation, with dysphonia and generalized weakness noted. She is not able to sit up past 30 degrees, but was positioned higher with use of reverse [place]. She has multiple swallows (up to 4-5 per bolus) across all consistencies and forceful throat clearing that begins with sips of thin liquids and multiple bites of purees. Given the above as well as some mild confusion noted, recommend that she remain NPO for now. RN could  administer a few single pieces of ice after oral care and essential meds crushed in puree. Will f/u for readiness for likely instrumental testing. SLP Visit Diagnosis: Dysphagia, unspecified (R13.10)    Aspiration Risk  Moderate aspiration risk    Diet Recommendation NPO except meds;Ice chips PRN after oral care (essential meds; a few pieces of ice from RN after oral care)   Medication Administration: Crushed with puree    Other  Recommendations Oral Care Recommendations: Oral care QID Other Recommendations: Have oral suction available   Follow up Recommendations  (tba)      Frequency and Duration min 2x/week  2 weeks       Prognosis Prognosis for Safe Diet Advancement: Good Barriers to Reach Goals: Cognitive deficits      Swallow Study   General HPI: Pt is a 65 yo female presented 7/1 with septic shock from PNA, requiring intubation (7/3-7/11) and CRRT (started 7/3). Brief PEA 7/3. PMH includes: CHF, CKD, T2DM, ESRD, HLD, HTN Type of Study: Bedside Swallow Evaluation Previous Swallow Assessment: none in chart Diet Prior to this Study: NPO Temperature Spikes Noted: No Respiratory Status: Nasal cannula History of Recent Intubation: Yes Length of Intubations (days): 9 days Date extubated: 11/06/20 Behavior/Cognition: Alert;Cooperative;Pleasant mood;Confused Oral Cavity Assessment: Within Functional Limits Oral Care Completed by SLP: No Oral Cavity - Dentition: Missing dentition Self-Feeding Abilities: Needs  assist Patient Positioning: Upright in bed Baseline Vocal Quality: Hoarse Volitional Cough: Strong Volitional Swallow: Unable to elicit    Oral/Motor/Sensory Function Overall Oral Motor/Sensory Function: Generalized oral weakness   Ice Chips Ice chips: Impaired Presentation: Spoon Pharyngeal Phase Impairments: Multiple swallows   Thin Liquid Thin Liquid: Impaired Presentation: Spoon;Straw Pharyngeal  Phase Impairments: Multiple swallows;Throat Clearing - Immediate     Nectar Thick Nectar Thick Liquid: Not tested   Honey Thick Honey Thick Liquid: Not tested   Puree Puree: Impaired Presentation: Spoon Pharyngeal Phase Impairments: Multiple swallows;Throat Clearing - Immediate   Solid     Solid: Not tested      Osie Bond., M.A. [place] Pager [phone] Office [phone]  11/07/2020,9:54 AM

## 2020-11-07 NOTE — Progress Notes (Signed)
ANTICOAGULATION CONSULT NOTE  Pharmacy Consult for Heparin  Indication: atrial fibrillation  Allergies  Allergen Reactions   Sulfa Antibiotics Swelling    Patient Measurements: Height: '5\' 4"'$  (162.6 cm) Weight: 84.7 kg (186 lb 11.7 oz) IBW/kg (Calculated) : 54.7   Vital Signs: Temp: 97.6 F (36.4 C) (07/12 0700) Temp Source: Axillary (07/12 0700) BP: 123/36 (07/12 0747) Pulse Rate: 74 (07/12 0800)  Labs: Recent Labs    11/05/20 0426 11/05/20 1600 11/06/20 0421 11/06/20 1301 11/06/20 1606 11/06/20 1643 11/07/20 0455  HGB 9.5*  --  7.5*  --   --  11.6* 10.0*  HCT 30.6*  --  25.3*  --   --  34.0* 33.9*  PLT 331  --  342  --   --   --  455*  HEPARINUNFRC 0.32  --  <0.10* 0.32  --   --  0.53  CREATININE 1.39*   < > 1.32*  --  1.25*  --  1.29*   < > = values in this interval not displayed.     Estimated Creatinine Clearance: 47 mL/min (A) (by C-G formula based on SCr of 1.29 mg/dL (H)).   Medical History: Past Medical History:  Diagnosis Date   Anemia    Arthritis    Asthma    in the past   CHF (congestive heart failure) ([place])    Diabetes mellitus with complication ([place])    Type 2   End stage renal disease on dialysis ([employer])    Gangrene ([facility])    Hyperlipidemia    Hypertension     Assessment: 65 y/o F with respiratory distress/pulmonary edema requiring transfered to the ICU and intubated, started on CRRT (ESRD). She is also noted to be in afib/flutter  > SR on amiodarone and pharmacy dosing heparin.   Heparin level at goal (0.53) on gtt at 1300 units/hr. Hg= 10   Goal of Therapy:  Heparin level 0.3-0.7 units/ml Monitor platelets by anticoagulation protocol: Yes   Plan:  Continue IV heparin 1300 units/hr. Daily heparin level and CBC.  Hildred Laser, PharmD Clinical Pharmacist **Pharmacist phone directory can now be found on [email or web address] ([email or web address]).  Listed under [place].

## 2020-11-07 NOTE — Progress Notes (Signed)
Assisted tele visit to patient with daughter.  Brilyn Tuller M, RN  

## 2020-11-07 NOTE — Progress Notes (Signed)
[facility] NEPHROLOGY PROGRESS NOTE  Assessment/ Plan: Pt is a 65 y.o. yo female  with history of HTN, HLD, peripheral vascular disease status post bilateral BKA, DM, anemia, ESRD on HD MWF at [facility] presented with cough, shortness of breath and generalized weakness, seen as a consultation for the management of dialysis.  Dialysis Orders from previous admission:  [facility] - MWF 4 hrs 400/500 97 kg 2.0K/2.5 Ca 16g needles -Heparin 1000 units initial bolus-heparin pump 1000 units/hr stop 1 hour prior to end of tx  -Epogen 14000 units IV TIW -Hectorol 1.5 mcg IV TIW  # ESRD MWF schedule.  On CRRT via temp LIJ catheter.  Hemodynamically improved, filter pressures climbing so will d/c CRRT.  Maintain HD catheter as AVF bruit is weak now and I'm concerned AVF won't be useable.  Plan next HD tomorrow.   #Septic shock due to left lung pneumonia: Patient was transferred to [facility].  Vent and pressor support per CCM.  Abx per primary service. Ct 7/7 without source of infection.  Shock improved.  #Thrombosed left radial artery/left hand ischemia -2/2 previous a line and pressors -s/p left radial artery thrombectomy and angio 7/9 w/ VVS  #Possible inferior STEMI -not a candidate for intervention currently. Per cardiology  #Vent dependent respiratory failure due to both pneumonia and pulm edema.  Antibiotics as above and volume management with CRRT.  #A. fib with RVR: Currently on amiodarone and heparin.  Cardiology is following.   # Anemia: Hemoglobin 7.5 today.  Continue to monitor.  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  Extubated.  Failed swallow eval.  No complaints currently.    Objective Vital signs in last 24 hours: Vitals:   11/07/20 0747 11/07/20 0800 11/07/20 0900 11/07/20 1000  BP: (!) 123/36     Pulse: 73 74 67 66  Resp: 17 16 (!) 21 20  Temp:      TempSrc:      SpO2: 100% 100% 100% 100%  Weight:      Height:       Weight change: -1.7 kg  Intake/Output Summary (Last 24 hours) at 11/07/2020 1008 Last data filed at 11/07/2020 1000 Gross per 24 hour  Intake 1114.17 ml  Output 2522 ml  Net -1407.83 ml        Labs: Basic Metabolic Panel: Recent Labs  Lab 11/06/20 0421 11/06/20 1606 11/06/20 1643 11/07/20 0455  NA 136 136 137 136  K 3.1* 3.7 3.9 3.9  CL 104 101  --  103  CO2 24 26  --  25  GLUCOSE 77 82  --  70  BUN 17 16  --  12  CREATININE 1.32* 1.25*  --  1.29*  CALCIUM 8.2* 8.8*  --  9.0  PHOS 3.0 2.8  --  2.3*    Liver Function Tests: Recent Labs  Lab 11/01/20 0308 11/01/20 1600 11/02/20 0341 11/02/20 1638 11/03/20 0401 11/03/20 1545 11/06/20 0421 11/06/20 1606 11/07/20 0455  AST 109*  --  74*  --  87*  84*  --   --   --   --   ALT 142*  --  117*  --  77*  75*  --   --   --   --  ALKPHOS 79  --  93  --  85  84  --   --   --   --   BILITOT 0.8  --  0.8  --  0.8  1.1  --   --   --   --   PROT 6.5  --  7.7  --  7.4  7.1  --   --   --   --   ALBUMIN 2.2*   < > 2.4*   < > 2.1*  2.1*   < > 2.2* 2.4* 2.4*   < > = values in this interval not displayed.    No results for input(s): LIPASE, AMYLASE in the last 168 hours. No results for input(s): AMMONIA in the last 168 hours. CBC: Recent Labs  Lab 11/01/20 0308 11/02/20 0341 11/03/20 0401 11/04/20 0401 11/05/20 0426 11/06/20 0421 11/06/20 1643 11/07/20 0455  WBC 27.1*   < > 28.2* 23.8* 22.7* 18.8*  --  25.9*  NEUTROABS 22.8*  --   --   --   --   --   --   --   HGB 8.1*   < > 7.7* 7.6* 9.5* 7.5* 11.6* 10.0*  HCT 27.8*   < > 26.1* 25.4* 30.6* 25.3* 34.0* 33.9*  MCV 82.5   < > 81.1 81.9 78.5* 80.8  --  81.5  PLT 449*   < > 377 375 331 342  --  455*   < >  = values in this interval not displayed.    Cardiac Enzymes: No results for input(s): CKTOTAL, CKMB, CKMBINDEX, TROPONINI in the last 168 hours. CBG: Recent Labs  Lab 11/06/20 1606 11/06/20 1928 11/06/20 2357 11/07/20 0456 11/07/20 0522  GLUCAP 85 162* 103* 62* 141*     Iron Studies: No results for input(s): IRON, TIBC, TRANSFERRIN, FERRITIN in the last 72 hours. Studies/Results: No results found.  Medications: Infusions:   prismasol BGK 4/2.5 500 mL/hr at 11/07/20 0239    prismasol BGK 4/2.5 200 mL/hr at 11/06/20 1258   sodium chloride 10 mL/hr at 11/07/20 1000   feeding supplement (VITAL 1.5 CAL) Stopped (11/06/20 1435)   heparin 1,300 Units/hr (11/07/20 1000)   phenylephrine (NEO-SYNEPHRINE) Adult infusion Stopped (11/06/20 1722)   prismasol BGK 4/2.5 1,500 mL/hr at 11/07/20 0525    Scheduled Medications:  amiodarone  400 mg Oral BID   Followed by   [name] ON 11/13/2020] amiodarone  200 mg Oral Daily   aspirin  81 mg Per Tube Daily   atorvastatin  40 mg Per Tube Daily   B-complex with vitamin C  1 tablet Per Tube Daily   chlorhexidine  15 mL Mouth Rinse BID   Chlorhexidine Gluconate Cloth  6 each Topical Q0600   clopidogrel  75 mg Per Tube Daily   darbepoetin (ARANESP) injection - DIALYSIS  100 mcg Intravenous Q Tue-HD   docusate  100 mg Per Tube Daily   feeding supplement (PROSource TF)  45 mL Per Tube QID   insulin aspart  3-9 Units Subcutaneous Q4H   insulin glargine  10 Units Subcutaneous Q12H   mouth rinse  15 mL Mouth Rinse q12n4p   pantoprazole sodium  40 mg Per Tube Daily   polyethylene glycol  17 g Per Tube BID   sodium chloride flush  10-40 mL Intracatheter Q12H    have reviewed scheduled and prn medications.  Physical Exam: General: Chronically ill appearing, but in no distress Heart:RRR, s1s2 nl Lungs: [place], b/l chest expansion Abdomen:soft,  non-distended Extremities:  Bilateral BKA, no stump/thigh edema, left hand with several necrotic digits  and hand with patchy duskiness but per RN pulses present and exam stable Neuro: awake, following commands Dialysis Access: Left IJ temporary HD catheter for CRRT.  AV fistula has + thrill and bruit but bruit barely present now.   Justin Mend 11/07/2020,10:08 AM  LOS: 11 days

## 2020-11-08 ENCOUNTER — Inpatient Hospital Stay (HOSPITAL_COMMUNITY): Payer: Medicare Other

## 2020-11-08 ENCOUNTER — Other Ambulatory Visit (HOSPITAL_COMMUNITY): Payer: Self-pay

## 2020-11-08 DIAGNOSIS — J189 Pneumonia, unspecified organism: Secondary | ICD-10-CM | POA: Diagnosis not present

## 2020-11-08 DIAGNOSIS — I10 Essential (primary) hypertension: Secondary | ICD-10-CM | POA: Diagnosis not present

## 2020-11-08 DIAGNOSIS — J9601 Acute respiratory failure with hypoxia: Secondary | ICD-10-CM | POA: Diagnosis not present

## 2020-11-08 DIAGNOSIS — I480 Paroxysmal atrial fibrillation: Secondary | ICD-10-CM | POA: Diagnosis not present

## 2020-11-08 DIAGNOSIS — I469 Cardiac arrest, cause unspecified: Secondary | ICD-10-CM | POA: Diagnosis not present

## 2020-11-08 DIAGNOSIS — E7800 Pure hypercholesterolemia, unspecified: Secondary | ICD-10-CM

## 2020-11-08 LAB — HEPARIN LEVEL (UNFRACTIONATED): Heparin Unfractionated: 0.41 IU/mL (ref 0.30–0.70)

## 2020-11-08 LAB — RENAL FUNCTION PANEL
Albumin: 2.3 g/dL — ABNORMAL LOW (ref 3.5–5.0)
Albumin: 2.5 g/dL — ABNORMAL LOW (ref 3.5–5.0)
Anion gap: 9 (ref 5–15)
Anion gap: 13 (ref 5–15)
BUN: 24 mg/dL — ABNORMAL HIGH (ref 8–23)
BUN: 31 mg/dL — ABNORMAL HIGH (ref 8–23)
CO2: 24 mmol/L (ref 22–32)
CO2: 22 mmol/L (ref 22–32)
Calcium: 9.3 mg/dL (ref 8.9–10.3)
Calcium: 9.5 mg/dL (ref 8.9–10.3)
Chloride: 101 mmol/L (ref 98–111)
Chloride: 97 mmol/L — ABNORMAL LOW (ref 98–111)
Creatinine, Ser: 2.66 mg/dL — ABNORMAL HIGH (ref 0.44–1.00)
Creatinine, Ser: 3.56 mg/dL — ABNORMAL HIGH (ref 0.44–1.00)
GFR, Estimated: 20 mL/min — ABNORMAL LOW (ref >60)
GFR, Estimated: 14 mL/min — ABNORMAL LOW (ref >60)
Glucose, Bld: 82 mg/dL (ref 70–99)
Glucose, Bld: 82 mg/dL (ref 70–99)
Phosphorus: 4.6 mg/dL (ref 2.5–4.6)
Phosphorus: 4.8 mg/dL — ABNORMAL HIGH (ref 2.5–4.6)
Potassium: 3.9 mmol/L (ref 3.5–5.1)
Potassium: 4.6 mmol/L (ref 3.5–5.1)
Sodium: 134 mmol/L — ABNORMAL LOW (ref 135–145)
Sodium: 132 mmol/L — ABNORMAL LOW (ref 135–145)

## 2020-11-08 LAB — GLUCOSE, CAPILLARY
Glucose-Capillary: 132 mg/dL — ABNORMAL HIGH (ref 70–99)
Glucose-Capillary: 142 mg/dL — ABNORMAL HIGH (ref 70–99)
Glucose-Capillary: 145 mg/dL — ABNORMAL HIGH (ref 70–99)
Glucose-Capillary: 47 mg/dL — ABNORMAL LOW (ref 70–99)
Glucose-Capillary: 55 mg/dL — ABNORMAL LOW (ref 70–99)
Glucose-Capillary: 63 mg/dL — ABNORMAL LOW (ref 70–99)
Glucose-Capillary: 63 mg/dL — ABNORMAL LOW (ref 70–99)
Glucose-Capillary: 67 mg/dL — ABNORMAL LOW (ref 70–99)
Glucose-Capillary: 73 mg/dL (ref 70–99)
Glucose-Capillary: 85 mg/dL (ref 70–99)

## 2020-11-08 LAB — CBC
HCT: 33.4 % — ABNORMAL LOW (ref 36.0–46.0)
Hemoglobin: 10 g/dL — ABNORMAL LOW (ref 12.0–15.0)
MCH: 24.3 pg — ABNORMAL LOW (ref 26.0–34.0)
MCHC: 29.9 g/dL — ABNORMAL LOW (ref 30.0–36.0)
MCV: 81.3 fL (ref 80.0–100.0)
Platelets: 559 10*3/uL — ABNORMAL HIGH (ref 150–400)
RBC: 4.11 MIL/uL (ref 3.87–5.11)
RDW: 21.9 % — ABNORMAL HIGH (ref 11.5–15.5)
WBC: 27.8 10*3/uL — ABNORMAL HIGH (ref 4.0–10.5)
nRBC: 0.1 % (ref 0.0–0.2)

## 2020-11-08 LAB — MAGNESIUM: Magnesium: 3.4 mg/dL — ABNORMAL HIGH (ref 1.7–2.4)

## 2020-11-08 MED ORDER — DEXTROSE 50 % IV SOLN
INTRAVENOUS | Status: AC
  Administered 2020-11-08: 25 mL
  Filled 2020-11-08: qty 50

## 2020-11-08 MED ORDER — RENA-VITE PO TABS
1.0000 | ORAL_TABLET | Freq: Every day | ORAL | Status: DC
  Administered 2020-11-08 – 2020-11-13 (×6): 1 via ORAL
  Filled 2020-11-08 (×6): qty 1

## 2020-11-08 MED ORDER — POLYETHYLENE GLYCOL 3350 17 G PO PACK
17.0000 g | PACK | Freq: Every day | ORAL | Status: DC
  Administered 2020-11-09 – 2020-11-14 (×3): 17 g
  Filled 2020-11-08 (×4): qty 1

## 2020-11-08 MED ORDER — MIDODRINE HCL 5 MG PO TABS
5.0000 mg | ORAL_TABLET | Freq: Three times a day (TID) | ORAL | Status: DC
  Administered 2020-11-08 – 2020-11-14 (×18): 5 mg
  Filled 2020-11-08 (×18): qty 1

## 2020-11-08 MED ORDER — PROSOURCE TF PO LIQD
45.0000 mL | Freq: Three times a day (TID) | ORAL | Status: DC
  Administered 2020-11-08 – 2020-11-10 (×6): 45 mL
  Filled 2020-11-08 (×6): qty 45

## 2020-11-08 NOTE — Progress Notes (Signed)
Progress Note  Patient Name: Christy Zhang Date of Encounter: 11/08/2020  [facility] Cardiologist: None   Subjective   Denies any chest pain or SOB  Inpatient Medications    Scheduled Meds:  amiodarone  400 mg Oral BID   Followed by   [name] ON 11/13/2020] amiodarone  200 mg Oral Daily   aspirin  81 mg Per Tube Daily   atorvastatin  40 mg Per Tube Daily   B-complex with vitamin C  1 tablet Per Tube Daily   chlorhexidine  15 mL Mouth Rinse BID   Chlorhexidine Gluconate Cloth  6 each Topical Q0600   Chlorhexidine Gluconate Cloth  6 each Topical Q0600   clopidogrel  75 mg Per Tube Daily   darbepoetin (ARANESP) injection - DIALYSIS  100 mcg Intravenous Q Tue-HD   feeding supplement (PROSource TF)  45 mL Per Tube QID   insulin aspart  3-9 Units Subcutaneous Q4H   mouth rinse  15 mL Mouth Rinse q12n4p   midodrine  5 mg Per Tube TID WC   pantoprazole sodium  40 mg Per Tube Daily   polyethylene glycol  17 g Per Tube Daily   sodium chloride flush  10-40 mL Intracatheter Q12H   Continuous Infusions:  sodium chloride Stopped (11/07/20 1143)   dextrose 50 mL/hr at 11/08/20 0600   feeding supplement (VITAL 1.5 CAL) Stopped (11/06/20 1435)   heparin 1,300 Units/hr (11/08/20 0600)   PRN Meds: sodium chloride, acetaminophen (TYLENOL) oral liquid 160 mg/5 mL, albuterol, alteplase, bisacodyl, Gerhardt's butt cream, heparin, metoprolol tartrate, sodium chloride, sodium chloride flush   Vital Signs    Vitals:   11/08/20 0400 11/08/20 0500 11/08/20 0600 11/08/20 0656  BP:      Pulse: 74 77 74 74  Resp: '10 19 15 13  '$ Temp:      TempSrc:      SpO2: 100% 100% 100% 100%  Weight:    83.8 kg  Height:        Intake/Output Summary (Last 24 hours) at 11/08/2020 0803 Last data filed at 11/08/2020 0600 Gross per 24 hour  Intake 816.55 ml  Output 232 ml  Net 584.55 ml    Last 3 Weights 11/08/2020 11/07/2020 11/06/2020  Weight (lbs) 184 lb 11.9 oz 186 lb 11.7 oz 190 lb 7.6 oz   Weight (kg) 83.8 kg 84.7 kg 86.4 kg      Telemetry    NSR - Personally Reviewed  ECG    N/a - Personally Reviewed  Physical Exam   VS:  BP (!) 127/35   Pulse 74   Temp 98 F (36.7 C) (Oral)   Resp 13   Ht '5\' 4"'$  (1.626 m)   Wt 83.8 kg   LMP  (LMP Unknown)   SpO2 100%   BMI 31.71 kg/m  , BMI Body mass index is 31.71 kg/m. GEN: Well nourished, well developed in no acute distress HEENT: Normal NECK: No JVD; No carotid bruits LYMPHATICS: No lymphadenopathy CARDIAC:RRR, no murmurs, rubs, gallops RESPIRATORY:  Clear to auscultation without rales, wheezing or rhonchi  ABDOMEN: Soft, non-tender, non-distended MUSCULOSKELETAL:  No edema; No deformity  SKIN: Warm and dry NEUROLOGIC:  Alert and oriented x 3 PSYCHIATRIC:  Normal affect   Labs    High Sensitivity Troponin:   Recent Labs  Lab 10/28/20 0358 10/28/20 0552 11/03/20 0516 11/03/20 0750 11/04/20 0458  TROPONINIHS 109* 107* 119* 115* 91*       Chemistry Recent Labs  Lab 11/02/20 0341 11/02/20 0741  11/03/20 0401 11/03/20 1545 11/07/20 0455 11/07/20 1620 11/08/20 0334  NA 131*   < > 134*   < > 136 137 134*  K 5.2*   < > 5.0   < > 3.9 4.0 3.9  CL 98   < > 102   < > 103 104 101  CO2 22   < > 23   < > '25 27 24  '$ GLUCOSE 306*   < > 200*   < > 70 60* 82  BUN 21   < > 22   < > 12 17 24*  CREATININE 1.65*   < > 1.68*   < > 1.29* 1.71* 2.66*  CALCIUM 8.8*   < > 8.7*   < > 9.0 9.4 9.3  PROT 7.7  --  7.4  7.1  --   --   --   --   ALBUMIN 2.4*   < > 2.1*  2.1*   < > 2.4* 2.4* 2.3*  AST 74*  --  87*  84*  --   --   --   --   ALT 117*  --  77*  75*  --   --   --   --   ALKPHOS 93  --  85  84  --   --   --   --   BILITOT 0.8  --  0.8  1.1  --   --   --   --   GFRNONAA 35*   < > 34*   < > 47* 33* 20*  ANIONGAP 11   < > 9   < > '8 6 9   '$ < > = values in this interval not displayed.      Hematology Recent Labs  Lab 11/06/20 0421 11/06/20 1643 11/07/20 0455 11/08/20 0334  WBC 18.8*  --  25.9* 27.8*   RBC 3.13*  --  4.16 4.11  HGB 7.5* 11.6* 10.0* 10.0*  HCT 25.3* 34.0* 33.9* 33.4*  MCV 80.8  --  81.5 81.3  MCH 24.0*  --  24.0* 24.3*  MCHC 29.6*  --  29.5* 29.9*  RDW 22.6*  --  22.1* 21.9*  PLT 342  --  455* 559*     BNP Recent Labs  Lab 11/04/20 0401  BNP 230.6*      DDimer No results for input(s): DDIMER in the last 168 hours.   Radiology    No results found.  Cardiac Studies   Echo 11/03/20:  1. Left ventricular ejection fraction, by estimation, is 60 to 65%. The  left ventricle has normal function. The left ventricle has no regional  wall motion abnormalities. There is moderate left ventricular hypertrophy.  Left ventricular diastolic  parameters are indeterminate.   2. Right ventricular systolic function is mildly reduced. The right  ventricular size is normal. Tricuspid regurgitation signal is inadequate  for assessing PA pressure.   3. A small pericardial effusion is present.   4. The mitral valve is normal in structure. No evidence of mitral valve  regurgitation. No evidence of mitral stenosis.   5. The aortic valve is tricuspid. Aortic valve regurgitation is not  visualized. Mild aortic valve sclerosis is present, with no evidence of  aortic valve stenosis.   6. The inferior vena cava is normal in size with <50% respiratory  variability, suggesting right atrial pressure of 8 mmHg.   Patient Profile     65 y.o. female with ESRD on HD, PAD status post bilateral BKA, diabetes,  hypertension, and obesity admitted with sepsis secondary to pneumonia.  Cardiology consulted for new onset atrial fibrillation with RVR and elevated troponin.  Assessment & Plan    # New onset atrial fibrillation/flutter: -She is maintaining NSR on exam today -continue Amio '400mg'$  BID and decrease to '200mg'$  daliy starting 7/18 She is on IV heparin.   -change IV heparin to Eliquis once she is on cleared by speech.    # Elevated troponin: -Likely related to [place] ischemia in the  setting of sepsis.   -HS troponin was elevated to 119 and relatively flat.   -2D echo with normal LVF -no further workup at this time  # Septic shock: Resolving.  Pressors weaned on 7/11.  # Hyperlipidemia:  Continue atorvastatin.    For questions or updates, please contact [place] Please consult [email or web address] for contact info under        Signed, Fransico Him, MD  11/08/2020, 8:03 AM

## 2020-11-08 NOTE — TOC Benefit Eligibility Note (Signed)
Patient Teacher, English as a foreign language completed.    The patient is currently admitted and upon discharge could be taking Eliquis 5 mg.  The current 30 day co-pay is, $0.00.   The patient is insured through [place], [place] Patient Advocate Specialist [place] Team Direct Number: [phone]  Fax: [phone]

## 2020-11-08 NOTE — Progress Notes (Signed)
  Speech Language Pathology Treatment: Dysphagia  Patient Details Name: Christy Zhang MRN: [id number] DOB: 1955/09/20 Today's Date: 11/08/2020 Time: [id number] SLP Time Calculation (min) (ACUTE ONLY): 18 min  Assessment / Plan / Recommendation Clinical Impression  Pt remains dysphonic but she is able to sit more upright in her bed today after removal of femoral line. She seems to be coughing more with thin liquids today, which could be an indicator of improving sensation. She has less subswallows with bites of puree, but after only four bites she starts to have some mild coughing. Overall mild progress noted today - she will need instrumental testing prior to initiating PO diet to better know oropharyngeal function in light of ongoing s/s of aspiration. She is scheduled for HD today and would likely benefit from a little more time post-extubation, so will tentatively plan to pursue MBS vs FEES on next date.    HPI HPI: Pt is a 65 yo female presented 7/1 with septic shock from PNA, requiring intubation (7/3-7/11) and CRRT (started 7/3). Brief PEA 7/3. PMH includes: CHF, CKD, T2DM, ESRD, HLD, HTN      SLP Plan  MBS       Recommendations  Diet recommendations: NPO;Other(comment) (few pieces of ice after oral care) Medication Administration: Crushed with puree                Oral Care Recommendations: Oral care QID Follow up Recommendations:  (tba) SLP Visit Diagnosis: Dysphagia, unspecified (R13.10) Plan: MBS       GO                Osie Bond., M.A. [facility] Pager [phone] Office [phone]  11/08/2020, 9:49 AM

## 2020-11-08 NOTE — Progress Notes (Signed)
Hypoglycemic Event  CBG 60  Treatment: 12.5g d50  Symptoms: none  Follow-up CBG: [id number] CBG Resul  173  Possible Reasons for Event: npo, lantus  Comments/MD notified protocol    Amo Kuffour, Trilby Drummer

## 2020-11-08 NOTE — Progress Notes (Addendum)
Nutrition Follow Up  DOCUMENTATION CODES:   Not applicable  INTERVENTION:   Tube feeding:  -Vital 1.5 @ 55 ml/hr via Cortrak (1320 ml) -ProSource TF 45 ml TID  Provides: 2100 kcals, 122 grams protein, 1008 ml free water.   Add renal MVI, d/c B complex with Vitamin C  Transition to standard formula upon next follow up  NUTRITION DIAGNOSIS:   Increased nutrient needs related to acute illness as evidenced by estimated needs.  Ongoing  GOAL:   Patient will meet greater than or equal to 90% of their needs  Met via TF  MONITOR:   Vent status, Skin, TF tolerance, Weight trends, Labs, I & O's  REASON FOR ASSESSMENT:   Consult, Ventilator Enteral/tube feeding initiation and management  ASSESSMENT:   Patient with PMH significant for HTN, HLD, PVD s/p bilateral BKA, DM, ESRD on HD, and CHF. Presents this admission with volume overload and PNA with pleural effusions.  7/03- brief PEA arrest 7/05- L chest tube placed  7/09- s/p L radial artery thrombectomy, artery patch angioplasty 7/12- extubated, CRRT stop   Found to have distal ischemia of L hand. May need partial amputation in the future. For HD today. Had hypoglycemic events this am, started on D5. Cortrak placed at bedside. Suspect improvement in CBG with restarting of TF.   SLP evaluation today and yesterday. Has ongoing signs and symptoms of aspiration. Plan for MBS likely tomorrow.   EDW: 97 kg  Admission weight: 89.7 kg  Current weight: 83.8 kg   UOP: anuric  Medications: aranesp, SS novolog, miralax Labs: Na 134 (L) Mg 3.4 (H) CBG 47-132  Diet Order:   Diet Order             Diet NPO time specified  Diet effective now                   EDUCATION NEEDS:   Not appropriate for education at this time  Skin:  Skin Assessment: Skin Integrity Issues: Skin Integrity Issues:: Other (Comment), Incisions, Stage II Stage II: coccyx Incisions: R BKA nonhealing Other: MASD- R groin  Last BM:   7/13  Height:   Ht Readings from Last 1 Encounters:  10/29/20 $RemoveB'5\' 4"'SMzRHTjH$  (1.626 m)    Weight:   Wt Readings from Last 1 Encounters:  11/08/20 83.8 kg    BMI:  Body mass index is 31.71 kg/m.  Estimated Nutritional Needs:   Kcal:  2100-2300 kcal  Protein:  120-140 grams  Fluid:  1000 ml + UOP  Ethen Bannan MS, RD, LDN, CNSC Clinical Nutrition Pager listed in [place]

## 2020-11-08 NOTE — Evaluation (Signed)
Physical Therapy Evaluation Patient Details Name: Christy Zhang MRN: [id number] DOB: 06/21/1955 Today's Date: 11/08/2020   History of Present Illness  65 y.o. female presenting to ED on 10/27/2020 due to AMS. CXR suggestive of acute consolidation or pneumonia with volume loss in the lingula. ETT 7/3-7/12.   PMH: bilateral BKA, ESRD on HD (MWF), T2DM, AOCD, hypertension, thrombocytosis, peripheral vascular disease and hyperlipidemia.  Clinical Impression  Pt very lethargic and intermittently responding to questions pertaining to home living and PLOF. Pt tolerates bed mobility, requiring 2 person physical assist to come to sit at EOB. Pt requests to return to supine due to fatigue and lethargy this session. Per pt's daughter, pt requires assistance for bathing and dressing and primarily utilizes a bedpan. Pt relies on hoyer lift for transfers and uses a w/c for mobility. Pt demonstrates deficits in overall strength, endurance, power, activity tolerance, and functional mobility and will benefit from acute PT to improve independence in functional mobility and decrease caregiver burden. SPT recommends HHPT to maximize functional mobility and improve independence.     Follow Up Recommendations Home health PT    Equipment Recommendations  None recommended by PT    Recommendations for Other Services       Precautions / Restrictions Precautions Precautions: Fall Precaution Comments: bil amputee Restrictions Weight Bearing Restrictions: Yes LLE Weight Bearing: Non weight bearing      Mobility  Bed Mobility Overal bed mobility: Needs Assistance Bed Mobility: Supine to Sit     Supine to sit: Max assist;HOB elevated;+2 for physical assistance     General bed mobility comments: cues to reach across and pull hand rail wtih UE. max A +2 to achieve sitting at EOB with intermitten min A to maintain seated posture.    Transfers                 General transfer comment: Pt uses hoyer  lift for transfers PTA and since second LE amputation per daugther's reports.  Ambulation/Gait                Stairs            Wheelchair Mobility    Modified Rankin (Stroke Patients Only)       Balance Overall balance assessment: Needs assistance Sitting-balance support: Single extremity supported Sitting balance-Leahy Scale: Poor Sitting balance - Comments: Pt requires at least single UE support and external support to maintain upright posture. Postural control: Posterior lean;Left lateral lean     Standing balance comment: unable                             Pertinent Vitals/Pain Pain Assessment: No/denies pain    Home Living Family/patient expects to be discharged to:: Private residence Living Arrangements: Children Available Help at Discharge: Family;Available 24 hours/day (daughter) Type of Home: House Home Access: Ramped entrance     Home Layout: Two level;Able to live on main level with bedroom/bathroom Home Equipment: Gilford Rile - 2 wheels;Tub bench;Hand held shower head;Wheelchair - [employer];Hospital bed;Other (comment) (hoyer lift, sliding board) Additional Comments: DME/ bathroom setup from chart review, limited history per PT    Prior Function Level of Independence: Needs assistance   Gait / Transfers Assistance Needed: w/c for mobility. Hoyer lift for transfers.  ADL's / Homemaking Assistance Needed: Pt receives assistance for sponge baths and for lower body dressing and to don bra.  Comments: per chart, pts daughter is her CAPS worker  Hand Dominance   Dominant Hand: Right    Extremity/Trunk Assessment   Upper Extremity Assessment Upper Extremity Assessment: Defer to OT evaluation    Lower Extremity Assessment Lower Extremity Assessment: Generalized weakness       Communication   Communication: No difficulties  Cognition Arousal/Alertness: Lethargic Behavior During Therapy: Flat affect Overall  Cognitive Status: Difficult to assess                                 General Comments: Pt reports she is at [facility] dialysis center. Pt states correct year, but incorrect day. Pt A&O to person.      General Comments General comments (skin integrity, edema, etc.): Pt lethargic and intermittenly responding to questions regarding history and PLOF. Daugther provides remaining pt history. Pt reports not using supplemental o2 at baseline.    Exercises     Assessment/Plan    PT Assessment Patient needs continued PT services  PT Problem List Decreased strength;Decreased activity tolerance;Decreased balance;Decreased mobility;Decreased safety awareness;Decreased knowledge of precautions       PT Treatment Interventions DME instruction;Functional mobility training;Therapeutic activities;Therapeutic exercise;Balance training;Patient/family education;Wheelchair mobility training    PT Goals (Current goals can be found in the Care Plan section)  Acute Rehab PT Goals Patient Stated Goal: get better PT Goal Formulation: With patient Time For Goal Achievement: 11/22/20 Potential to Achieve Goals: Good    Frequency Min 2X/week   Barriers to discharge        Co-evaluation PT/OT/SLP Co-Evaluation/Treatment: Yes Reason for Co-Treatment: For patient/therapist safety;To address functional/ADL transfers PT goals addressed during session: Mobility/safety with mobility;Balance         AM-PAC PT "6 Clicks" Mobility  Outcome Measure Help needed turning from your back to your side while in a flat bed without using bedrails?: Total Help needed moving from lying on your back to sitting on the side of a flat bed without using bedrails?: Total Help needed moving to and from a bed to a chair (including a wheelchair)?: Total Help needed standing up from a chair using your arms (e.g., wheelchair or bedside chair)?: Total Help needed to walk in hospital room?: Total Help needed climbing 3-5  steps with a railing? : Total 6 Click Score: 6    End of Session   Activity Tolerance: Patient limited by fatigue;Other (comment) (lethargy) Patient left: in bed;with call bell/phone within reach;with bed alarm set Nurse Communication: Mobility status PT Visit Diagnosis: Other abnormalities of gait and mobility (R26.89);Muscle weakness (generalized) (M62.81)    Time: [id number] PT Time Calculation (min) (ACUTE ONLY): 30 min   Charges:   PT Evaluation $PT Eval Moderate Complexity: 1 Mod          Acute Rehab  Pager: [phone]   Garwin Brothers, SPT  11/08/2020, 1:20 PM

## 2020-11-08 NOTE — Evaluation (Signed)
Occupational Therapy Evaluation Patient Details Name: Christy Zhang MRN: [id number] DOB: [birth year] Today's Date: 11/08/2020    History of Present Illness 65 y.o. female presenting to ED on 10/27/2020 due to AMS. CXR suggestive of acute consolidation or pneumonia with volume loss in the lingula. ETT 7/3-7/12.   PMH: bilateral BKA, ESRD on HD (MWF), T2DM, AOCD, hypertension, thrombocytosis, peripheral vascular disease and hyperlipidemia.   Clinical Impression   Patient admitted for above and presenting with problem list below, including impaired balance, generalized weakness, and impaired cognition.  Pt lethargic throughout session requiring cueing to maintain eyes open; oriented to self and year, intermittently following simple commands with multimodal cueing and increased time, with decreased attention, recall and problem solving.  She requires max assist +2 for bed mobility, up to total assist for ADLs.  Per pts daughter report (PT spoke to her) patient requires assist for donning bra and LB dressing/bathing, toileting using bed pan; using hoyer lift for all transfers.  Based on performance today, pt will benefit from OT services acutely to optimize return to PLOF and decrease burden of care. Recommend HHOT at dc.     Follow Up Recommendations  Home health OT;Supervision/Assistance - 24 hour    Equipment Recommendations  None recommended by OT    Recommendations for Other Services       Precautions / Restrictions Precautions Precautions: Fall Precaution Comments: bil amputee Restrictions Weight Bearing Restrictions: Yes LLE Weight Bearing: Non weight bearing      Mobility Bed Mobility Overal bed mobility: Needs Assistance Bed Mobility: Supine to Sit;Sit to Supine     Supine to sit: Max assist;HOB elevated;+2 for physical assistance Sit to supine: Total assist;+2 for physical assistance;+2 for safety/equipment   General bed mobility comments: cues to reach across and pull  hand rail wtih UE. max A +2 to achieve sitting at EOB with intermittent min A to maintain seated posture. Returned to supine with total assist +2    Transfers                 General transfer comment: Pt uses hoyer lift for transfers PTA and since second LE amputation per daugther's reports.    Balance Overall balance assessment: Needs assistance Sitting-balance support: Single extremity supported Sitting balance-Leahy Scale: Poor Sitting balance - Comments: Pt requires at least single UE support and external support to maintain upright posture. Postural control: Posterior lean;Left lateral lean     Standing balance comment: unable                           ADL either performed or assessed with clinical judgement   ADL Overall ADL's : Needs assistance/impaired     Grooming: Maximal assistance;Bed level   Upper Body Bathing: Maximal assistance;Bed level   Lower Body Bathing: Total assistance;+2 for physical assistance;+2 for safety/equipment;Bed level   Upper Body Dressing : Maximal assistance;Bed level   Lower Body Dressing: Total assistance;+2 for physical assistance;+2 for safety/equipment;Bed level                       Vision   Additional Comments: further assessment, requires cueing to maintain eyes open during session     Perception     Praxis      Pertinent Vitals/Pain Pain Assessment: No/denies pain     Hand Dominance Right   Extremity/Trunk Assessment Upper Extremity Assessment Upper Extremity Assessment: Generalized weakness;LUE deficits/detail LUE Deficits / Details: L radial artery  thrombotic occlusion with necorsis noted in fingers, pt denies pain. decreased [place] LUE Sensation: decreased light touch LUE Coordination: decreased fine motor   Lower Extremity Assessment Lower Extremity Assessment: Defer to PT evaluation       Communication Communication Communication: No difficulties   Cognition Arousal/Alertness:  Lethargic Behavior During Therapy: Flat affect Overall Cognitive Status: Impaired/Different from baseline Area of Impairment: Problem solving;Following commands;Orientation;Attention;Memory;Safety/judgement;Awareness                 Orientation Level: Disoriented to;Time;Situation;Place (oriented to year but not date) Current Attention Level: Focused Memory: Decreased recall of precautions;Decreased short-term memory Following Commands: Follows one step commands inconsistently;Follows one step commands with increased time Safety/Judgement: Decreased awareness of deficits;Decreased awareness of safety Awareness: Intellectual Problem Solving: Slow processing;Decreased initiation;Requires tactile cues;Difficulty sequencing;Requires verbal cues General Comments: pt lethargic and requires increased time with mulitmodal cueing to engage in session. She reports she is at [facility] dialysis center.  Requires cueing to maintain eyes open during session.   General Comments  Pt lethargic and intermittenly responding to questions regarding history and PLOF. Daugther provides remaining pt history. Pt reports not using supplemental o2 at baseline.    Exercises     Shoulder Instructions      Home Living Family/patient expects to be discharged to:: Private residence Living Arrangements: Children Available Help at Discharge: Family;Available 24 hours/day (daughter) Type of Home: House Home Access: Ramped entrance     Home Layout: Two level;Able to live on main level with bedroom/bathroom     Bathroom Shower/Tub: Teacher, early years/pre: Standard     Home Equipment: Environmental consultant - 2 wheels;Tub bench;Hand held shower head;Wheelchair - [employer];Hospital bed;Other (comment) (hoyer lift, sliding board)   Additional Comments: DME/ bathroom setup from chart review, limited history per PT      Prior Functioning/Environment Level of Independence: Needs assistance  Gait / Transfers  Assistance Needed: w/c for mobility. Hoyer lift for transfers. ADL's / Homemaking Assistance Needed: Pt receives assistance for sponge baths and for lower body dressing and to don bra.   Comments: per chart, pts daughter is her CAPS worker        OT Problem List: Decreased strength;Decreased range of motion;Impaired balance (sitting and/or standing);Decreased activity tolerance;Decreased coordination;Decreased cognition;Decreased safety awareness;Decreased knowledge of use of DME or AE;Obesity;Pain;Impaired UE functional use;Decreased knowledge of precautions      OT Treatment/Interventions: Self-care/ADL training;Therapeutic exercise;Therapeutic activities;Cognitive remediation/compensation;Patient/family education;Balance training    OT Goals(Current goals can be found in the care plan section) Acute Rehab OT Goals Patient Stated Goal: none stated OT Goal Formulation: Patient unable to participate in goal setting Time For Goal Achievement: 11/22/20 Potential to Achieve Goals: Good  OT Frequency: Min 2X/week   Barriers to D/C:            Co-evaluation PT/OT/SLP Co-Evaluation/Treatment: Yes Reason for Co-Treatment: For patient/therapist safety;To address functional/ADL transfers;Necessary to address cognition/behavior during functional activity PT goals addressed during session: Mobility/safety with mobility;Balance OT goals addressed during session: ADL's and self-care      AM-PAC OT "6 Clicks" Daily Activity     Outcome Measure Help from another person eating meals?: Total Help from another person taking care of personal grooming?: A Lot Help from another person toileting, which includes using toliet, bedpan, or urinal?: Total Help from another person bathing (including washing, rinsing, drying)?: A Lot Help from another person to put on and taking off regular upper body clothing?: A Lot Help from another person to put on and taking  off regular lower body clothing?: Total 6  Click Score: 9   End of Session Equipment Utilized During Treatment: Oxygen Nurse Communication: Mobility status  Activity Tolerance: Patient tolerated treatment well Patient left: in bed;with call bell/phone within reach;with bed alarm set  OT Visit Diagnosis: Other abnormalities of gait and mobility (R26.89);Muscle weakness (generalized) (M62.81);Other symptoms and signs involving cognitive function                Time: [id number] OT Time Calculation (min): 29 min Charges:  OT General Charges $OT Visit: 1 Visit OT Evaluation $OT Eval Moderate Complexity: 1 Mod  Jolaine Artist, OT Acute Rehabilitation Services Pager [phone] Office [phone]   Delight Stare 11/08/2020, 1:47 PM

## 2020-11-08 NOTE — Progress Notes (Signed)
NAME:  Christy Zhang, MRN:  [id number], DOB:  [birth year], LOS: 12 ADMISSION DATE:  10/27/2020, CONSULTATION DATE:  7/3 REFERRING MD:  Tyrell Antonio, CHIEF COMPLAINT:  dyspnea   Brief History of Present Illness:  65 y/o female admitted for presumed pneumonia and hypotension, required intubation, mechanical ventilation and CRRT in the setting of septic shock from pneumonia cauting acute respiratory failure with hypoxemia and AKI.  Pertinent  Medical History  CHF CKD T2DM ESRD HLD HTN  Significant Hospital Events: Including procedures, antibiotic start and stop dates in addition to other pertinent events   Admitted to hospital on 7/1 7/1 cefepime >  7/1 flagyl x1 7/1 vancomycin>  Admitted to ICU on 10/29/20 for CRRT 7/3 - brief PEA with bearing down while agitated. 7/3 ETT 7/3 left radial arterial line 7/3 L IJ HD cath 7/5 L pigtail CT placed for L pleural effusion 7/6 CT output -550 cc 7/8 A fib, ST elevation noted per EKG, Concern for ischemia, cards consulted 7/12 extubated  7/8>> [place] LUE >Occluded radial artery from the mid to distal segment. Ulnar artery patent  with calcification through the mid to distal segments. Appearance of a  non-obstructive lesion in the distal brachial artery.  7/7 >> CT Chest Abdomen  IMPRESSION: 3.5 cm heterogeneously enhancing abnormality is again noted arising from lower pole of left kidney concerning for possible neoplasm. Further evaluation with MRI with and without gadolinium is recommended.   Endotracheal tube is directed slightly into right mainstem bronchus; withdrawal by 2-3 cm is recommended.   Small right pleural effusion is noted with adjacent subsegmental atelectasis of right lower lobe.   Pigtail catheter is noted in left hemithorax. No pneumothorax is noted.   Mild gallbladder distention is now noted. No cholelithiasis or inflammatory changes noted.   Interim History / Subjective:  Doing well Strength improving To get  cortrak today  Objective   Blood pressure (!) 127/35, pulse 74, temperature 97.8 F (36.6 C), temperature source Oral, resp. rate 13, height '5\' 4"'$  (1.626 m), weight 83.8 kg, SpO2 100 %.    Vent Mode: BIPAP FiO2 (%):  [40 %] 40 % Set Rate:  [8 bmp] 8 bmp PEEP:  [5 cmH20] 5 cmH20   Intake/Output Summary (Last 24 hours) at 11/08/2020 0831 Last data filed at 11/08/2020 0600 Gross per 24 hour  Intake 816.55 ml  Output 232 ml  Net 584.55 ml    Filed Weights   11/06/20 0615 11/07/20 0500 11/08/20 0656  Weight: 86.4 kg 84.7 kg 83.8 kg    Examination: Constitutional: no distress lying in bed  Eyes: EOMI, pupils equal Ears, nose, mouth, and throat: malampatti 4, trachea midline Cardiovascular: RRR, bl BKA, ext warm Respiratory: shallow inspiratory efforts, no wheezing Gastrointestinal: soft, +BS Skin: left hand ischemic demarcation stable, hurts a bit but mild Neurologic: moves all 4 ext, weak Psychiatric: RASS 0   Resolved Hospital Problem list     Assessment & Plan:  Acute respiratory failure with hypercarbia and hypoxemia due to severe community acquired pneumonia- Improved, off vent Likely sleep disordered breathing- probably would benefit from sleep eval as OP Parapneumonic effusion- drained, no recurrence, chest tube out Septic shock due to CAP- resolved Muscular weakness- biggest remaining issue Constipation- resolved PVD ESRD- nearing euvolemia L radial artery thrombotic occlusion- post thrombectomy, some distal ischemia, allow to demarcate, local wound care, eventual partial amputation at some point Leukamoid reaction- up again, multiple infectious studies negative Anemia- stable Afib, likely type 2 NSTEMI- no plans for cath DM2-  with hypoglycemia due to lack of PO  - Cortrak, start TF, titrate up insulin thereafter - HD per nephrology to be attempted through R fistula - Start midodrine - Remove femoral arterial line, will be tricky to see where to get Bps  (fistula R arm, recent thrombectomy L radial artery, bilateral BKA). I think we will try to see if can get BP on LE stump, if unable to we can do left arm as thrombus is gone, inciting arterial line is out, and has good pulse. - Watch WBCs, consider manual diff and smear if continues to go up - Heparin gtt for now until we determine if she needs PEG and depending on timing of L digit amputations - DC PPI (not a PTA med) - IS, mobility as able - Stable for transfer to tele, appreciate [facility] taking over care starting 7/14  Erskine Emery MD [facility] epic messenger for cross cover needs If after hours, please call E-link

## 2020-11-08 NOTE — Progress Notes (Signed)
CBG via fingerstick was 47. Verified by arterial line draw which read 55. 1/2 amp D50 given per protocol. Recheck was 63. Another 1/2 amp D50 given. Recheck was 145. Dr. Tamala Julian notified. Lantus discontinued and D5 still running until Cortrak placed and tube feeds to be started.

## 2020-11-08 NOTE — Progress Notes (Signed)
Patient transferred to dialysis.

## 2020-11-08 NOTE — Progress Notes (Signed)
ANTICOAGULATION CONSULT NOTE - Follow Up Consult  Pharmacy Consult for heparin Indication: atrial fibrillation  Allergies  Allergen Reactions   Sulfa Antibiotics Swelling    Patient Measurements: Height: '5\' 4"'$  (162.6 cm) Weight: 83.8 kg (184 lb 11.9 oz) IBW/kg (Calculated) : 54.7  Vital Signs: Temp: 98 F (36.7 C) (07/13 1100) Temp Source: Axillary (07/13 1100) BP: 99/49 (07/13 0837) Pulse Rate: 74 (07/13 1100)  Labs: Recent Labs    11/06/20 0421 11/06/20 1301 11/06/20 1606 11/06/20 1643 11/07/20 0455 11/07/20 1620 11/08/20 0334  HGB 7.5*  --   --  11.6* 10.0*  --  10.0*  HCT 25.3*  --   --  34.0* 33.9*  --  33.4*  PLT 342  --   --   --  455*  --  559*  HEPARINUNFRC <0.10* 0.32  --   --  0.53  --  0.41  CREATININE 1.32*  --    < >  --  1.29* 1.71* 2.66*   < > = values in this interval not displayed.    Estimated Creatinine Clearance: 22.7 mL/min (A) (by C-G formula based on SCr of 2.66 mg/dL (H)).   Medications:  Infusions:   sodium chloride Stopped (11/07/20 1143)   dextrose 50 mL/hr at 11/08/20 1100   feeding supplement (VITAL 1.5 CAL) Stopped (11/06/20 1435)   heparin 1,300 Units/hr (11/08/20 1123)    Assessment: 65 y/o F with respiratory distress/pulmonary edema requiring transfered to the ICU and intubated, started on CRRT (ESRD). She is also noted to be in afib/flutter  > SR on amiodarone and pharmacy dosing heparin.  Patient now off CRRT and resuming HD this afternoon.   Heparin level= 0.41 (therapeutic)  Hgb= 10.0 (stable and improving) Goal of Therapy:  Heparin level 0.3-0.7 units/ml Monitor platelets by anticoagulation protocol: Yes   Plan:  Continue IV heparin at 13000 units/hr Continue to monitor H&H and platelets daily  Anderson Malta A Breely Panik 11/08/2020,11:53 AM

## 2020-11-08 NOTE — Procedures (Signed)
Cortrak  Person Inserting Tube:  Maylon Peppers C, RD Tube Type:  Cortrak - 43 inches Tube Size:  10 Tube Location:  Left nare Initial Placement:  Stomach Secured by: Bridle Technique Used to Measure Tube Placement:  Marking at nare/corner of mouth Cortrak Secured At:  70 cm  Cortrak Tube Team Note:  Consult received to place a Cortrak feeding tube.   X-ray is required, abdominal x-ray has been ordered by the Cortrak team. Please confirm tube placement before using the Cortrak tube.   If the tube becomes dislodged please keep the tube and contact the Cortrak team at [email or web address] ([email or web address]) for replacement.  If after hours and replacement cannot be delayed, place a NG tube and confirm placement with an abdominal x-ray.    Lockie Pares., RD, LDN, CNSC See AMiON for contact information

## 2020-11-08 NOTE — Progress Notes (Signed)
[facility] NEPHROLOGY PROGRESS NOTE  Assessment/ Plan: Pt is a 65 y.o. yo female  with history of HTN, HLD, peripheral vascular disease status post bilateral BKA, DM, anemia, ESRD on HD MWF at [facility] presented with cough, shortness of breath and generalized weakness, seen as a consultation for the management of dialysis.  Dialysis Orders from previous admission:  [facility] - MWF 4 hrs 400/500 97 kg 2.0K/2.5 Ca 16g needles -Heparin 1000 units initial bolus-heparin pump 1000 units/hr stop 1 hour prior to end of tx  -Epogen 14000 units IV TIW -Hectorol 1.5 mcg IV TIW  # ESRD MWF schedule.  S/p CRRT, stopped 7/12.   Maintain HD catheter as AVF bruit is weak now and I'm concerned AVF won't be useable.  With labs and volume stable plan next HD tomorrow, Thurs.  Will resume MWF schedule at some point prior to dispo.    #Septic shock due to left lung pneumonia: Patient was transferred to [facility].  Vent and pressor support per CCM.  Abx per primary service. Ct 7/7 without source of infection.  Shock improved.  #Thrombosed left radial artery/left hand ischemia -2/2 previous a line and pressors -s/p left radial artery thrombectomy and angio 7/9 w/ VVS  #Possible inferior STEMI -not a candidate for intervention currently. Per cardiology  #Vent dependent respiratory failure due to both pneumonia and pulm edema.  Resolved with UF and antibiotics.  #A. fib with RVR: Currently on amiodarone and heparin.  Appears plans for conversion to eliquis.  Cardiology is following.   # Anemia: Hemoglobin 10  today.  Continue to monitor.  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  #Possible left lower pole renal neoplasm -workup as an outpatient, will need a MRI w/ and w/o down the road  [name] MD [place] Pager [phone]  Subjective: Off CRRT since yesterday AM.  Labs ok this AM. WBC rising to  28k but afebrile.    Objective Vital signs in last 24 hours: Vitals:   11/08/20 0400 11/08/20 0500 11/08/20 0600 11/08/20 0656  BP:      Pulse: 74 77 74 74  Resp: '10 19 15 13  '$ Temp:      TempSrc:      SpO2: 100% 100% 100% 100%  Weight:    83.8 kg  Height:       Weight change: -0.9 kg  Intake/Output Summary (Last 24 hours) at 11/08/2020 0746 Last data filed at 11/08/2020 0600 Gross per 24 hour  Intake 839.51 ml  Output 283 ml  Net 556.51 ml        Labs: Basic Metabolic Panel: Recent Labs  Lab 11/07/20 0455 11/07/20 1620 11/08/20 0334  NA 136 137 134*  K 3.9 4.0 3.9  CL 103 104 101  CO2 '25 27 24  '$ GLUCOSE 70 60* 82  BUN 12 17 24*  CREATININE 1.29* 1.71* 2.66*  CALCIUM 9.0 9.4 9.3  PHOS 2.3* 3.4 4.6    Liver Function Tests: Recent Labs  Lab 11/02/20 0341 11/02/20 1638 11/03/20 0401 11/03/20 1545 11/07/20 0455 11/07/20 1620 11/08/20 0334  AST 74*  --  87*  84*  --   --   --   --   ALT 117*  --  77*  75*  --   --   --   --   ALKPHOS 93  --  85  84  --   --   --   --  BILITOT 0.8  --  0.8  1.1  --   --   --   --   PROT 7.7  --  7.4  7.1  --   --   --   --   ALBUMIN 2.4*   < > 2.1*  2.1*   < > 2.4* 2.4* 2.3*   < > = values in this interval not displayed.    No results for input(s): LIPASE, AMYLASE in the last 168 hours. No results for input(s): AMMONIA in the last 168 hours. CBC: Recent Labs  Lab 11/04/20 0401 11/05/20 0426 11/06/20 0421 11/06/20 1643 11/07/20 0455 11/08/20 0334  WBC 23.8* 22.7* 18.8*  --  25.9* 27.8*  HGB 7.6* 9.5* 7.5* 11.6* 10.0* 10.0*  HCT 25.4* 30.6* 25.3* 34.0* 33.9* 33.4*  MCV 81.9 78.5* 80.8  --  81.5 81.3  PLT 375 331 342  --  455* 559*    Cardiac Enzymes: No results for input(s): CKTOTAL, CKMB, CKMBINDEX, TROPONINI in the last 168 hours. CBG: Recent Labs  Lab 11/07/20 1934 11/07/20 1959 11/07/20 2016 11/07/20 2358 11/08/20 0318  GLUCAP 60* 173* 118* 89 73     Iron Studies: No results for  input(s): IRON, TIBC, TRANSFERRIN, FERRITIN in the last 72 hours. Studies/Results: No results found.  Medications: Infusions:   prismasol BGK 4/2.5 500 mL/hr at 11/07/20 0239    prismasol BGK 4/2.5 200 mL/hr at 11/06/20 1258   sodium chloride Stopped (11/07/20 1143)   dextrose 50 mL/hr at 11/08/20 0600   feeding supplement (VITAL 1.5 CAL) Stopped (11/06/20 1435)   heparin 1,300 Units/hr (11/08/20 0600)   prismasol BGK 4/2.5 1,500 mL/hr at 11/07/20 0525    Scheduled Medications:  amiodarone  400 mg Oral BID   Followed by   [name] ON 11/13/2020] amiodarone  200 mg Oral Daily   aspirin  81 mg Per Tube Daily   atorvastatin  40 mg Per Tube Daily   B-complex with vitamin C  1 tablet Per Tube Daily   chlorhexidine  15 mL Mouth Rinse BID   Chlorhexidine Gluconate Cloth  6 each Topical Q0600   Chlorhexidine Gluconate Cloth  6 each Topical Q0600   clopidogrel  75 mg Per Tube Daily   darbepoetin (ARANESP) injection - DIALYSIS  100 mcg Intravenous Q Tue-HD   feeding supplement (PROSource TF)  45 mL Per Tube QID   insulin aspart  3-9 Units Subcutaneous Q4H   mouth rinse  15 mL Mouth Rinse q12n4p   midodrine  5 mg Per Tube TID WC   pantoprazole sodium  40 mg Per Tube Daily   polyethylene glycol  17 g Per Tube Daily   sodium chloride flush  10-40 mL Intracatheter Q12H    have reviewed scheduled and prn medications.  Physical Exam: General: Chronically ill appearing, but in no distress Heart:RRR, s1s2 nl Lungs: [place], b/l chest expansion Abdomen:soft,  non-distended Extremities: Bilateral BKA, no stump/thigh edema, left hand with several necrotic digits and hand with patchy duskiness that is stable Neuro: awake, following commands Dialysis Access: Left IJ temporary HD catheter for CRRT.  AV fistula has + thrill and bruit but bruit quiet now.   Ria Comment A Shirell Struthers 11/08/2020,7:46 AM  LOS: 12 days

## 2020-11-08 NOTE — Progress Notes (Signed)
Hypoglycemic Event  CBG: 67  Treatment: 1/2 amp D50  Symptoms: lethargy  Follow-up CBG: Time: 1220 CBG Result:132  Possible Reasons for Event: NPO  Comments/MD notified: none - tube feeds just started    Christy Zhang

## 2020-11-09 ENCOUNTER — Inpatient Hospital Stay (HOSPITAL_COMMUNITY): Payer: Medicare Other

## 2020-11-09 ENCOUNTER — Encounter (HOSPITAL_COMMUNITY): Payer: Self-pay | Admitting: Internal Medicine

## 2020-11-09 DIAGNOSIS — J189 Pneumonia, unspecified organism: Secondary | ICD-10-CM | POA: Diagnosis not present

## 2020-11-09 DIAGNOSIS — I152 Hypertension secondary to endocrine disorders: Secondary | ICD-10-CM

## 2020-11-09 DIAGNOSIS — I214 Non-ST elevation (NSTEMI) myocardial infarction: Secondary | ICD-10-CM | POA: Diagnosis not present

## 2020-11-09 DIAGNOSIS — E1159 Type 2 diabetes mellitus with other circulatory complications: Secondary | ICD-10-CM | POA: Diagnosis not present

## 2020-11-09 DIAGNOSIS — T82590A Other mechanical complication of surgically created arteriovenous fistula, initial encounter: Secondary | ICD-10-CM | POA: Diagnosis not present

## 2020-11-09 DIAGNOSIS — I480 Paroxysmal atrial fibrillation: Secondary | ICD-10-CM | POA: Diagnosis not present

## 2020-11-09 DIAGNOSIS — I739 Peripheral vascular disease, unspecified: Secondary | ICD-10-CM | POA: Diagnosis not present

## 2020-11-09 LAB — CBC
HCT: 30.7 % — ABNORMAL LOW (ref 36.0–46.0)
Hemoglobin: 9.3 g/dL — ABNORMAL LOW (ref 12.0–15.0)
MCH: 24 pg — ABNORMAL LOW (ref 26.0–34.0)
MCHC: 30.3 g/dL (ref 30.0–36.0)
MCV: 79.3 fL — ABNORMAL LOW (ref 80.0–100.0)
Platelets: 602 10*3/uL — ABNORMAL HIGH (ref 150–400)
RBC: 3.87 MIL/uL (ref 3.87–5.11)
RDW: 21.4 % — ABNORMAL HIGH (ref 11.5–15.5)
WBC: 21.8 10*3/uL — ABNORMAL HIGH (ref 4.0–10.5)
nRBC: 0 % (ref 0.0–0.2)

## 2020-11-09 LAB — RENAL FUNCTION PANEL
Albumin: 2.1 g/dL — ABNORMAL LOW (ref 3.5–5.0)
Albumin: 2 g/dL — ABNORMAL LOW (ref 3.5–5.0)
Anion gap: 9 (ref 5–15)
Anion gap: 14 (ref 5–15)
BUN: 24 mg/dL — ABNORMAL HIGH (ref 8–23)
BUN: 41 mg/dL — ABNORMAL HIGH (ref 8–23)
CO2: 26 mmol/L (ref 22–32)
CO2: 23 mmol/L (ref 22–32)
Calcium: 8.6 mg/dL — ABNORMAL LOW (ref 8.9–10.3)
Calcium: 8.7 mg/dL — ABNORMAL LOW (ref 8.9–10.3)
Chloride: 98 mmol/L (ref 98–111)
Chloride: 98 mmol/L (ref 98–111)
Creatinine, Ser: 2.82 mg/dL — ABNORMAL HIGH (ref 0.44–1.00)
Creatinine, Ser: 3.86 mg/dL — ABNORMAL HIGH (ref 0.44–1.00)
GFR, Estimated: 18 mL/min — ABNORMAL LOW (ref >60)
GFR, Estimated: 13 mL/min — ABNORMAL LOW (ref >60)
Glucose, Bld: 208 mg/dL — ABNORMAL HIGH (ref 70–99)
Glucose, Bld: 136 mg/dL — ABNORMAL HIGH (ref 70–99)
Phosphorus: 2.8 mg/dL (ref 2.5–4.6)
Phosphorus: 3.4 mg/dL (ref 2.5–4.6)
Potassium: 4.2 mmol/L (ref 3.5–5.1)
Potassium: 4.6 mmol/L (ref 3.5–5.1)
Sodium: 133 mmol/L — ABNORMAL LOW (ref 135–145)
Sodium: 135 mmol/L (ref 135–145)

## 2020-11-09 LAB — GLUCOSE, CAPILLARY
Glucose-Capillary: 125 mg/dL — ABNORMAL HIGH (ref 70–99)
Glucose-Capillary: 130 mg/dL — ABNORMAL HIGH (ref 70–99)
Glucose-Capillary: 135 mg/dL — ABNORMAL HIGH (ref 70–99)
Glucose-Capillary: 171 mg/dL — ABNORMAL HIGH (ref 70–99)
Glucose-Capillary: 175 mg/dL — ABNORMAL HIGH (ref 70–99)
Glucose-Capillary: 191 mg/dL — ABNORMAL HIGH (ref 70–99)
Glucose-Capillary: 197 mg/dL — ABNORMAL HIGH (ref 70–99)

## 2020-11-09 LAB — HEPATITIS B SURFACE ANTIBODY,QUALITATIVE: Hep B S Ab: NONREACTIVE

## 2020-11-09 LAB — MAGNESIUM: Magnesium: 2.5 mg/dL — ABNORMAL HIGH (ref 1.7–2.4)

## 2020-11-09 LAB — HEPATITIS B CORE ANTIBODY, TOTAL: Hep B Core Total Ab: NONREACTIVE

## 2020-11-09 LAB — HEPARIN LEVEL (UNFRACTIONATED)
Heparin Unfractionated: 0.25 IU/mL — ABNORMAL LOW (ref 0.30–0.70)
Heparin Unfractionated: 0.31 IU/mL (ref 0.30–0.70)

## 2020-11-09 LAB — HEPATITIS B SURFACE ANTIGEN: Hepatitis B Surface Ag: NONREACTIVE

## 2020-11-09 MED ORDER — NYSTATIN 100000 UNIT/GM EX POWD
Freq: Two times a day (BID) | CUTANEOUS | Status: DC
  Filled 2020-11-09: qty 15

## 2020-11-09 MED ORDER — SODIUM CHLORIDE 0.9 % IV SOLN: 100.0000 mL | INTRAVENOUS | Status: DC | PRN

## 2020-11-09 MED ORDER — PENTAFLUOROPROP-TETRAFLUOROETH EX AERO: 1.0000 "application " | INHALATION_SPRAY | CUTANEOUS | Status: DC | PRN

## 2020-11-09 MED ORDER — LIDOCAINE HCL (PF) 1 % IJ SOLN: 5.0000 mL | INTRAMUSCULAR | Status: DC | PRN

## 2020-11-09 MED ORDER — ALTEPLASE 2 MG IJ SOLR: 2.0000 mg | Freq: Once | INTRAMUSCULAR | Status: DC | PRN

## 2020-11-09 MED ORDER — LIDOCAINE-PRILOCAINE 2.5-2.5 % EX CREA: 1.0000 "application " | TOPICAL_CREAM | CUTANEOUS | Status: DC | PRN

## 2020-11-09 MED ORDER — HEPARIN SODIUM (PORCINE) 1000 UNIT/ML DIALYSIS: 1000.0000 [IU] | INTRAMUSCULAR | Status: DC | PRN

## 2020-11-09 MED ORDER — CEFAZOLIN SODIUM-DEXTROSE 2-4 GM/100ML-% IV SOLN: 2.0000 g | INTRAVENOUS | Status: AC

## 2020-11-09 MED ORDER — SODIUM CHLORIDE 0.9 % IV SOLN
100.0000 mL | INTRAVENOUS | Status: DC | PRN
  Administered 2020-11-09: 100 mL via INTRAVENOUS

## 2020-11-09 NOTE — Progress Notes (Signed)
Dialysis access duplex has been completed.   Preliminary results in CV Proc.   [name] 11/09/2020 3:47 PM

## 2020-11-09 NOTE — Progress Notes (Signed)
ANTICOAGULATION CONSULT NOTE - Follow Up Consult  Pharmacy Consult for heparin Indication: atrial fibrillation   Labs: Recent Labs    11/07/20 0455 11/07/20 1620 11/08/20 0334 11/08/20 1527 11/09/20 0410 11/09/20 1301  HGB 10.0*  --  10.0*  --  9.3*  --   HCT 33.9*  --  33.4*  --  30.7*  --   PLT 455*  --  559*  --  602*  --   HEPARINUNFRC 0.53  --  0.41  --  0.25* 0.31  CREATININE 1.29*   < > 2.66* 3.56* 2.82*  --    < > = values in this interval not displayed.     Assessment: 65yo female with HL 0.31 therapeutic level after  heparin increased this morning to 1400 units/hr. No signs of bleeding reported per RN.   Heparin is infusing via  left IJ triple lumen and heparin level was drawn by lab (peripheral blood draw), per RN's report.    Hgb = 10s >9.3, Plts wnl to 600s  Planning  for right AV graft fistulogram with possible angioplasty, thrombectomy/declot, stent placement, and TDC placement per IR on tomorrow 11/10/20.  Goal of Therapy:  Heparin level 0.3-0.7 units/ml   Plan:  Continue heparin drip at 1400 units/hr Check HL and CBC daily  Monitor for signs and symptoms of bleeding   Nicole Cella, RPh Clinical Pharmacist [phone] Please check AMION for all [place] phone numbers After 10:00 PM, call [place] [phone]  11/09/2020,2:55 PM

## 2020-11-09 NOTE — Progress Notes (Signed)
PROGRESS NOTE    Christy Zhang  [id number] DOB: [birth year] DOA: 10/27/2020 PCP: Claretta Fraise, MD   Chief Complain: Altered mental status  Brief Narrative: Patient is a 58 old female with history of bilateral BKA, ESRD on dialysis on Monday/Wednesday/Friday, type 2 diabetes, hypertension, peripheral vascular disease, hyperlipidemia who presented to the emergency department for the evaluation of altered mental status.  She was complaining of 2-week history of intermittent shortness of breath, dry cough, fatigue, weakness.  On presentation she was hypertensive.  Lab work showed leukocytosis, hyponatremia, hyperglycemia, creatinine of 6.4, lactic acid of 2.9.  Chest x-ray showed acute consolidation/pneumonia with fullness.  Patient was started on Rocephin antibiotics for septic shock, started on IV fluids.  She was intubated for airway protection, put on mechanical ventilation.  She also was started on CRRT on 7/3 in the setting of septic shock from pneumonia causing acute respiratory failure with hypoxia and AKI.  Hospital course remarkable for new onset A. fib, cardiology following.  Assessment & Plan:   Principal Problem:   CAP (community acquired pneumonia) Active Problems:   Anemia of chronic disease   Essential (primary) hypertension   Hyperglycemia due to diabetes mellitus ([place])   Leucocytosis   Thrombocytosis   ESRD on hemodialysis (HCC)   PVD (peripheral vascular disease)/ S/p BKA   Altered mental status   Acute respiratory failure with hypoxemia (HCC)   Lactic acidosis   Hypoalbuminemia   Hyponatremia   Hyperlipidemia   Cardiac arrest (HCC)   Paroxysmal atrial fibrillation ([place])  Septic shock: Resolving.  Blood pressure still soft.  Pressors were weaned  on 7/11.  Also on midodrine.  Acute respiratory failure with hypoxia/hypercarbia: Secondary to severe community-acquired pneumonia.  Was intubated and on mechanical ventilation.  Now extubated.  PC see M following.   She finished a course of antibiotics.  Currently on room air  ESRD on hemodialysis: Nephrology following.  CRRT stopped on 7/12.  Underwent dialysis.  Previous aVF was not usable for hemodialysis so IR consulted for fistulogram.  Has working temporary hemodialysis catheter on the left neck  Parapneumonic effusion: Drained, chest tube has been taken out  New onset A. fib/flutter: Currently in normal sinus rhythm.  On oral amiodarone.  Plan is to convert the IV heparin to Eliquis at some point.  Elevated troponin: Most likely secondary to demand ischemia in the setting of sepsis.  Troponins were flat trend.  2D echo showed normal left ventricular function.  No further work-up recommended by cardiology.  Left radial artery thrombotic occlusion: Status post thrombectomy, distal ischemia.  Continue local wound care.  She may need amputation at some point, vascular surgery was following.  Hyperlipidemia: Continue statin  Constipation: Continue bowel regimen  Leukocytosis: Suspected to be from leukemoid reaction.  Continue to monitor  Normocytic anemia: Currently hemoglobin stable.  Continue to monitor  Diabetes type 2: Hospital course remarkable for hypoglycemia.  Monitor blood sugars.  On tube feeding through core track.  Weakness/deconditioning: PT/OT consulted.  Recommended home health PT on discharge  Dysphagia: Speech therapy following.  Recommended dysphagia 1 diet.  Will continue tube feeding also for now until she has good oral intake  Suspected left lower pole renal neoplasm: Work-up as an outpatient recommended.  Nutrition Problem: Increased nutrient needs Etiology: acute illness      DVT prophylaxis: IV heparin Code Status: Full Family Communication: None at bedside Status is: Inpatient  Remains inpatient appropriate because:Inpatient level of care appropriate due to severity of illness  Dispo:  The patient is from: Home              Anticipated d/c is to: Home               Patient currently is not medically stable to d/c.   Difficult to place patient No    Consultants: PCCM, cardiology, nephrology  Procedures: CRRT, intubation  Important events:  Admitted to hospital on 7/1 7/1 cefepime >  7/1 flagyl x1 7/1 vancomycin>  Admitted to ICU on 10/29/20 for CRRT 7/3 - brief PEA with bearing down while agitated. 7/3 ETT 7/3 left radial arterial line 7/3 L IJ HD cath 7/5 L pigtail CT placed for L pleural effusion 7/6 CT output -550 cc 7/8 A fib, ST elevation noted per EKG, Concern for ischemia, cards consulted 7/12 extubated  Antimicrobials:  Anti-infectives (From admission, onward)    Start     Dose/Rate Route Frequency Ordered Stop   11/03/20 2200  vancomycin (VANCOCIN) IVPB 1000 mg/200 mL premix  Status:  Discontinued        1,000 mg 200 mL/hr over 60 Minutes Intravenous Every 24 hours 11/02/20 2017 11/06/20 0750   11/02/20 2115  vancomycin (VANCOREADY) IVPB 1250 mg/250 mL        1,250 mg 166.7 mL/hr over 90 Minutes Intravenous  Once 11/02/20 2017 11/02/20 2245   11/02/20 1930  meropenem (MERREM) 1 g in sodium chloride 0.9 % 100 mL IVPB  Status:  Discontinued        1 g 200 mL/hr over 30 Minutes Intravenous Every 8 hours 11/02/20 1841 11/06/20 0750   10/30/20 1200  vancomycin (VANCOCIN) IVPB 750 mg/150 ml premix  Status:  Discontinued        750 mg 150 mL/hr over 60 Minutes Intravenous Every M-W-F (Hemodialysis) 10/28/20 0521 10/28/20 1311   10/30/20 1200  vancomycin (VANCOCIN) IVPB 1000 mg/200 mL premix  Status:  Discontinued        1,000 mg 200 mL/hr over 60 Minutes Intravenous Every M-W-F (Hemodialysis) 10/28/20 1311 10/29/20 0639   10/29/20 1800  vancomycin (VANCOCIN) IVPB 1000 mg/200 mL premix  Status:  Discontinued        1,000 mg 200 mL/hr over 60 Minutes Intravenous Every 24 hours 10/29/20 0639 10/31/20 1044   10/29/20 1000  ceFEPIme (MAXIPIME) 2 g in sodium chloride 0.9 % 100 mL IVPB  Status:  Discontinued        2 g 200 mL/hr  over 30 Minutes Intravenous Every 12 hours 10/29/20 0637 11/02/20 1718   10/28/20 2000  ceFEPIme (MAXIPIME) 1 g in sodium chloride 0.9 % 100 mL IVPB  Status:  Discontinued        1 g 200 mL/hr over 30 Minutes Intravenous Every 24 hours 10/27/20 2109 10/29/20 0637   10/27/20 2100  vancomycin (VANCOCIN) IVPB 1000 mg/200 mL premix        1,000 mg 200 mL/hr over 60 Minutes Intravenous  Once 10/27/20 2057 10/28/20 0008   10/27/20 1900  ceFEPIme (MAXIPIME) 2 g in sodium chloride 0.9 % 100 mL IVPB        2 g 200 mL/hr over 30 Minutes Intravenous  Once 10/27/20 1848 10/27/20 2128   10/27/20 1900  metroNIDAZOLE (FLAGYL) IVPB 500 mg        500 mg 100 mL/hr over 60 Minutes Intravenous  Once 10/27/20 1848 10/27/20 2128   10/27/20 1900  vancomycin (VANCOCIN) IVPB 1000 mg/200 mL premix        1,000 mg 200 mL/hr over 60  Minutes Intravenous  Once 10/27/20 1848 10/27/20 2046       Subjective: Patient seen and examined at the bedside this morning.  Hemodynamically stable.  She was on room air.  She had no new complaints.  She was alert and awake .  She says that she does not have any sensation on the left hand.  Not in distress.  Objective: Vitals:   11/08/20 2332 11/09/20 0431 11/09/20 0500 11/09/20 0801  BP: (!) 90/30   (!) 98/26  Pulse: 76   83  Resp: 20   (!) 25  Temp:  98.6 F (37 C)    TempSrc:  Oral    SpO2: 100%   100%  Weight:   85 kg   Height:        Intake/Output Summary (Last 24 hours) at 11/09/2020 0807 Last data filed at 11/09/2020 0615 Gross per 24 hour  Intake 1498.4 ml  Output 680 ml  Net 818.4 ml   Filed Weights   11/07/20 0500 11/08/20 0656 11/09/20 0500  Weight: 84.7 kg 83.8 kg 85 kg    Examination:  General exam: Overall comfortable, very deconditioned, obese HEENT: Feeding tube, temporary dialysis catheter on the left neck Respiratory system: Bilateral rhonchi Cardiovascular system: S1 & S2 heard, RRR.  Gastrointestinal system: Abdomen is nondistended, soft  and nontender. Central nervous system: Alert and awake.  Knows current month Extremities: Bilateral BKA, gangrenous left hand. Skin: No rashes, no ulcers,no icterus  e.     Data Reviewed: I have personally reviewed following labs and imaging studies  CBC: Recent Labs  Lab 11/05/20 0426 11/06/20 0421 11/06/20 1643 11/07/20 0455 11/08/20 0334 11/09/20 0410  WBC 22.7* 18.8*  --  25.9* 27.8* 21.8*  HGB 9.5* 7.5* 11.6* 10.0* 10.0* 9.3*  HCT 30.6* 25.3* 34.0* 33.9* 33.4* 30.7*  MCV 78.5* 80.8  --  81.5 81.3 79.3*  PLT 331 342  --  455* 559* [id number]*   Basic Metabolic Panel: Recent Labs  Lab 11/05/20 0426 11/05/20 1600 11/06/20 0421 11/06/20 1606 11/07/20 0455 11/07/20 1620 11/08/20 0334 11/08/20 1527 11/09/20 0410  NA 134*   < > 136   < > 136 137 134* 132* 133*  K 3.8   < > 3.1*   < > 3.9 4.0 3.9 4.6 4.2  CL 101   < > 104   < > 103 104 101 97* 98  CO2 24   < > 24   < > '25 27 24 22 26  '$ GLUCOSE 142*   < > 77   < > 70 60* 82 82 208*  BUN 19   < > 17   < > 12 17 24* 31* 24*  CREATININE 1.39*   < > 1.32*   < > 1.29* 1.71* 2.66* 3.56* 2.82*  CALCIUM 8.5*   < > 8.2*   < > 9.0 9.4 9.3 9.5 8.6*  MG 2.5*  --  2.4  --  2.8*  --  3.4*  --  2.5*  PHOS 3.1   < > 3.0   < > 2.3* 3.4 4.6 4.8* 2.8   < > = values in this interval not displayed.   GFR: Estimated Creatinine Clearance: 21.5 mL/min (A) (by C-G formula based on SCr of 2.82 mg/dL (H)). Liver Function Tests: Recent Labs  Lab 11/03/20 0401 11/03/20 1545 11/07/20 0455 11/07/20 1620 11/08/20 0334 11/08/20 1527 11/09/20 0410  AST 87*  84*  --   --   --   --   --   --  ALT 77*  75*  --   --   --   --   --   --   ALKPHOS 85  84  --   --   --   --   --   --   BILITOT 0.8  1.1  --   --   --   --   --   --   PROT 7.4  7.1  --   --   --   --   --   --   ALBUMIN 2.1*  2.1*   < > 2.4* 2.4* 2.3* 2.5* 2.1*   < > = values in this interval not displayed.   No results for input(s): LIPASE, AMYLASE in the last 168 hours. No  results for input(s): AMMONIA in the last 168 hours. Coagulation Profile: No results for input(s): INR, PROTIME in the last 168 hours. Cardiac Enzymes: No results for input(s): CKTOTAL, CKMB, CKMBINDEX, TROPONINI in the last 168 hours. BNP (last 3 results) No results for input(s): PROBNP in the last 8760 hours. HbA1C: No results for input(s): HGBA1C in the last 72 hours. CBG: Recent Labs  Lab 11/08/20 1558 11/08/20 2337 11/09/20 0025 11/09/20 0429 11/09/20 0716  GLUCAP 85 142* 175* 171* 197*   Lipid Profile: No results for input(s): CHOL, HDL, LDLCALC, TRIG, CHOLHDL, LDLDIRECT in the last 72 hours. Thyroid Function Tests: No results for input(s): TSH, T4TOTAL, FREET4, T3FREE, THYROIDAB in the last 72 hours. Anemia Panel: No results for input(s): VITAMINB12, FOLATE, FERRITIN, TIBC, IRON, RETICCTPCT in the last 72 hours. Sepsis Labs: No results for input(s): PROCALCITON, LATICACIDVEN in the last 168 hours.  Recent Results (from the past 240 hour(s))  Pleural fluid culture w Gram Stain     Status: None   Collection Time: 10/31/20  5:54 PM   Specimen: Pleural Fluid  Result Value Ref Range Status   Specimen Description FLUID PLEURAL LEFT  Final   Special Requests NONE  Final   Gram Stain NO WBC SEEN NO ORGANISMS SEEN   Final   Culture   Final    NO GROWTH 3 DAYS Performed at [facility], [address], [place], [place] [address]    Report Status 11/04/2020 FINAL  Final  Culture, Respiratory w Gram Stain     Status: None   Collection Time: 11/02/20  8:40 AM   Specimen: Tracheal Aspirate; Respiratory  Result Value Ref Range Status   Specimen Description TRACHEAL ASPIRATE  Final   Special Requests NONE  Final   Gram Stain   Final    FEW WBC PRESENT, PREDOMINANTLY PMN NO ORGANISMS SEEN    Culture   Final    RARE Normal respiratory flora-no Staph aureus or Pseudomonas seen Performed at [facility], [address], [place], [place] [address]    Report  Status 11/04/2020 FINAL  Final  Culture, blood (routine x 2)     Status: None   Collection Time: 11/02/20  7:10 PM   Specimen: BLOOD LEFT HAND  Result Value Ref Range Status   Specimen Description BLOOD LEFT HAND  Final   Special Requests   Final    BOTTLES DRAWN AEROBIC ONLY Blood Culture results may not be optimal due to an inadequate volume of blood received in culture bottles   Culture   Final    NO GROWTH 5 DAYS Performed at [facility], [place] [address], [place], [place] [address]    Report Status 11/07/2020 FINAL  Final  Culture, blood (routine x 2)     Status: None   Collection Time: 11/02/20  7:10 PM   Specimen: BLOOD LEFT HAND  Result Value Ref Range Status   Specimen Description BLOOD LEFT HAND  Final   Special Requests   Final    BOTTLES DRAWN AEROBIC ONLY Blood Culture results may not be optimal due to an inadequate volume of blood received in culture bottles   Culture   Final    NO GROWTH 5 DAYS Performed at [facility], [place] [address] [place], [place] [address]    Report Status 11/07/2020 FINAL  Final         Radiology Studies: DG Abd Portable 1V  Result Date: 11/08/2020 CLINICAL DATA:  Evaluate feeding tube placement EXAM: PORTABLE ABDOMEN - 1 VIEW COMPARISON:  11/04/2020 FINDINGS: Interval placement of feeding tube. The tip is in the projection of the distal stomach/pylorus. Distal portion of left IJ catheter is again noted projecting over the cavoatrial junction. IMPRESSION: Interval placement of feeding tube with tip in the distal stomach/pylorus. Electronically Signed   By: Kerby Moors M.D.   On: 11/08/2020 11:35        Scheduled Meds:  amiodarone  400 mg Oral BID   Followed by   [name] ON 11/13/2020] amiodarone  200 mg Oral Daily   aspirin  81 mg Per Tube Daily   atorvastatin  40 mg Per Tube Daily   chlorhexidine  15 mL Mouth Rinse BID   Chlorhexidine Gluconate Cloth  6 each Topical Q0600   Chlorhexidine Gluconate Cloth  6 each  Topical Q0600   clopidogrel  75 mg Per Tube Daily   darbepoetin (ARANESP) injection - DIALYSIS  100 mcg Intravenous Q Tue-HD   feeding supplement (PROSource TF)  45 mL Per Tube TID   insulin aspart  3-9 Units Subcutaneous Q4H   mouth rinse  15 mL Mouth Rinse q12n4p   midodrine  5 mg Per Tube TID WC   multivitamin  1 tablet Oral QHS   polyethylene glycol  17 g Per Tube Daily   sodium chloride flush  10-40 mL Intracatheter Q12H   Continuous Infusions:  sodium chloride Stopped (11/07/20 1143)   sodium chloride     sodium chloride 100 mL (11/09/20 0116)   dextrose Stopped (11/08/20 1221)   feeding supplement (VITAL 1.5 CAL) 1,000 mL (11/08/20 1211)   heparin 1,400 Units/hr (11/09/20 0650)     LOS: 13 days    Time spent: 35 mins.More than 50% of that time was spent in counseling and/or coordination of care.      Shelly Coss, MD [facility] P7/14/2022, 8:07 AM

## 2020-11-09 NOTE — Consult Note (Signed)
   [facility] Inpatient Consult   11/09/2020  Christy Zhang May 06, 1955 [id number]  [place] [place] [facility] [ACO] Patient: Medicare CMS DCE  Primary Care Provider:  Claretta Fraise, MD of [facility]    This patient was active in an Embedded practice chronic disease management program with the [facility] Management team. Patient transitioned to telemetry from ICU 2H on 11/08/20, patient is on the extreme high risk list for unplanned readmission score with day 12 hospitalization. Chart review of PT and OT progress notes for progress and plans for post hospital transition.  Pt has HD MWF noted.  Plan:  Will update the [facility] Embedded team of disposition and post hospital follow up needs.  For questions contact:   Natividad Brood, RN BSN [facility] Liaison  [phone] business mobile phone Toll free office [phone]  Fax number: [phone] [email or web address]

## 2020-11-09 NOTE — Progress Notes (Signed)
[facility] NEPHROLOGY PROGRESS NOTE  Assessment/ Plan: Pt is a 65 y.o. yo female  with history of HTN, HLD, peripheral vascular disease status post bilateral BKA, DM, anemia, ESRD on HD MWF at [facility] presented with cough, shortness of breath and generalized weakness, seen as a consultation for the management of dialysis.  Dialysis Orders from previous admission:  [facility] - MWF 4 hrs 400/500 97 kg 2.0K/2.5 Ca 16g needles -Heparin 1000 units initial bolus-heparin pump 1000 units/hr stop 1 hour prior to end of tx  -Epogen 14000 units IV TIW -Hectorol 1.5 mcg IV TIW  # ESRD MWF schedule.  S/p CRRT, stopped 7/12.  Had HD today and will resume MWF schedule at some point prior to dispo.  AVF was not useable for HD so will consult IR for fistulogram; has working temp HD catheter so if fistula not going to work will need tunneled catheter.   #Septic shock due to left lung pneumonia: Patient was transferred to [facility].  Vent and pressor support per CCM.  Abx per primary service. Ct 7/7 without source of infection.  Shock improved. Remains on midodrine.  #Thrombosed left radial artery/left hand ischemia -2/2 previous a line and pressors -s/p left radial artery thrombectomy and angio 7/9 w/ VVS  #Possible inferior STEMI -not a candidate for intervention currently. Per cardiology  #A. fib with RVR: Currently on amiodarone and heparin.  Appears plans for conversion to eliquis.  Cardiology is following.   # Anemia: Hemoglobin 9.3  today.  Continue to monitor.  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  Afebrile, WBC trending down. Passed swallow test with some modifications this AM.  Had HD early this  AM. NO complaints.   Objective Vital signs in last 24 hours: Vitals:   11/08/20 2332 11/09/20 0431 11/09/20 0500 11/09/20 0801  BP: (!) 90/30   (!) 98/26  Pulse: 76   83  Resp: 20   (!) 25  Temp:  98.6 F (37 C)    TempSrc:  Oral    SpO2: 100%   100%  Weight:   85 kg   Height:       Weight change: 1.2 kg  Intake/Output Summary (Last 24 hours) at 11/09/2020 1020 Last data filed at 11/09/2020 0615 Gross per 24 hour  Intake 1376.48 ml  Output 680 ml  Net 696.48 ml        Labs: Basic Metabolic Panel: Recent Labs  Lab 11/08/20 0334 11/08/20 1527 11/09/20 0410  NA 134* 132* 133*  K 3.9 4.6 4.2  CL 101 97* 98  CO2 '24 22 26  '$ GLUCOSE 82 82 208*  BUN 24* 31* 24*  CREATININE 2.66* 3.56* 2.82*  CALCIUM 9.3 9.5 8.6*  PHOS 4.6 4.8* 2.8    Liver Function Tests: Recent Labs  Lab 11/03/20 0401 11/03/20 1545 11/08/20 0334 11/08/20 1527 11/09/20 0410  AST 87*  84*  --   --   --   --   ALT 77*  75*  --   --   --   --   ALKPHOS 85  84  --   --   --   --   BILITOT 0.8  1.1  --   --   --   --  PROT 7.4  7.1  --   --   --   --   ALBUMIN 2.1*  2.1*   < > 2.3* 2.5* 2.1*   < > = values in this interval not displayed.    No results for input(s): LIPASE, AMYLASE in the last 168 hours. No results for input(s): AMMONIA in the last 168 hours. CBC: Recent Labs  Lab 11/05/20 0426 11/06/20 0421 11/06/20 1643 11/07/20 0455 11/08/20 0334 11/09/20 0410  WBC 22.7* 18.8*  --  25.9* 27.8* 21.8*  HGB 9.5* 7.5*   < > 10.0* 10.0* 9.3*  HCT 30.6* 25.3*   < > 33.9* 33.4* 30.7*  MCV 78.5* 80.8  --  81.5 81.3 79.3*  PLT 331 342  --  455* 559* 602*   < > = values in this interval not displayed.    Cardiac Enzymes: No results for input(s): CKTOTAL, CKMB, CKMBINDEX, TROPONINI in the last 168 hours. CBG: Recent Labs  Lab 11/08/20 1558 11/08/20 2337 11/09/20 0025 11/09/20 0429 11/09/20 0716  GLUCAP 85 142* 175* 171* 197*     Iron Studies: No results for input(s):  IRON, TIBC, TRANSFERRIN, FERRITIN in the last 72 hours. Studies/Results: DG Abd Portable 1V  Result Date: 11/08/2020 CLINICAL DATA:  Evaluate feeding tube placement EXAM: PORTABLE ABDOMEN - 1 VIEW COMPARISON:  11/04/2020 FINDINGS: Interval placement of feeding tube. The tip is in the projection of the distal stomach/pylorus. Distal portion of left IJ catheter is again noted projecting over the cavoatrial junction. IMPRESSION: Interval placement of feeding tube with tip in the distal stomach/pylorus. Electronically Signed   By: Kerby Moors M.D.   On: 11/08/2020 11:35   DG Swallowing Func-Speech Pathology  Result Date: 11/09/2020 Formatting of this result is different from the original. Objective Swallowing Evaluation: Type of Study: MBS-Modified Barium Swallow Study  Patient Details Name: Christy Zhang MRN: [id number] Date of Birth: 1955/11/05 Today's Date: 11/09/2020 Time: SLP Start Time (ACUTE ONLY): [id number] -SLP Stop Time (ACUTE ONLY): 0840 SLP Time Calculation (min) (ACUTE ONLY): 12 min Past Medical History: Past Medical History: Diagnosis Date  Anemia   Arthritis   Asthma   in the past  CHF (congestive heart failure) ([place])   Diabetes mellitus with complication ([place])   Type 2  End stage renal disease on dialysis ([place])   Gangrene ([place])   Hyperlipidemia   Hypertension  Past Surgical History: Past Surgical History: Procedure Laterality Date   LEFT BELOW KNEE AMPUTATION (Left Knee)  06/08/2020  A/V FISTULAGRAM Right 02/04/2017  Procedure: A/V Fistulagram;  Surgeon: Waynetta Sandy, MD;  Location: [place] [facility];  Service: Cardiovascular;  Laterality: Right;  ABDOMINAL AORTOGRAM W/LOWER EXTREMITY N/A 11/18/2019  Procedure: ABDOMINAL AORTOGRAM W/LOWER EXTREMITY;  Surgeon: [name], MD;  Location: [place] [facility];  Service: Cardiovascular;  Laterality: N/A;  ABDOMINAL AORTOGRAM W/LOWER EXTREMITY N/A 02/23/2020  Procedure: ABDOMINAL AORTOGRAM W/LOWER EXTREMITY;  Surgeon: [name], MD;  Location: [place] [facility];  Service: Cardiovascular;  Laterality: N/A;  Bilateral  ABDOMINAL AORTOGRAM W/LOWER EXTREMITY N/A 08/10/2020  Procedure: ABDOMINAL AORTOGRAM W/LOWER EXTREMITY;  Surgeon: [name], MD;  Location: [place] [facility];  Service: Cardiovascular;  Laterality: N/A;  AMPUTATION Left 06/08/2020  Procedure: LEFT BELOW KNEE AMPUTATION;  Surgeon: Cherre Robins, MD;  Location: [place];  Service: Vascular;  Laterality: Left;  AMPUTATION Right 09/14/2020  Procedure: RIGHT BELOW KNEE AMPUTATION;  Surgeon: Waynetta Sandy, MD;  Location: [place];  Service: Vascular;  Laterality:  Right;  AV FISTULA PLACEMENT Right 08/25/2014  Procedure: RIGHT BRACHIOCEPHALIC ARTERIOVENOUS (AV) FISTULA CREATION;  Surgeon: Mal Misty, MD;  Location: [place];  Service: Vascular;  Laterality: Right;  AV FISTULA PLACEMENT Right 03/18/2019  Procedure: INSERTION OF ARTERIOVENOUS (AV) GORE-TEX GRAFT RIGHT ARM;  Surgeon: Serafina Mitchell, MD;  Location: [place];  Service: Vascular;  Laterality: Right;  [place] Right 12/24/2018  Procedure: FIRST STAGE BASILIC VEIN TRANSPOSITION RIGHT UPPER ARM;  Surgeon: Serafina Mitchell, MD;  Location: [place];  Service: Vascular;  Laterality: Right;  FISTULA SUPERFICIALIZATION Right 11/03/2014  Procedure: RIGHT UPPER ARM FISTULA SUPERFICIALIZATION;  Surgeon: Mal Misty, MD;  Location: [place];  Service: Vascular;  Laterality: Right;  FISTULOGRAM Right 01/16/2016  Procedure: RIGHT UPPER EXTREMITY ARTERIOVENOUS FISTULOGRAM WITH BALLOON ANGIOPLASTY;  Surgeon: Vickie Epley, MD;  Location: [facility];  Service: Vascular;  Laterality: Right;  INSERTION OF DIALYSIS CATHETER    X4  IR FLUORO GUIDE CV LINE RIGHT  12/03/2018  IR REMOVAL TUN CV CATH W/O FL  07/06/2019  IR THROMBECTOMY AV FISTULA W/THROMBOLYSIS/PTA INC/SHUNT/IMG RIGHT Right 12/03/2018  IR THROMBECTOMY AV FISTULA W/THROMBOLYSIS/PTA/STENT INC/SHUNT/IMG RT Right 10/11/2019  IR US GUIDE VASC ACCESS  RIGHT  12/03/2018  IR US GUIDE VASC ACCESS RIGHT  12/03/2018  IR US GUIDE VASC ACCESS RIGHT  10/14/2019  LIGATION OF COMPETING BRANCHES OF ARTERIOVENOUS FISTULA Right 11/03/2014  Procedure: LIGATION OF COMPETING BRANCHE x1 OF RIGHT UPPER ARM ARTERIOVENOUS FISTULA;  Surgeon: Mal Misty, MD;  Location: [place];  Service: Vascular;  Laterality: Right;  PERIPHERAL VASCULAR BALLOON ANGIOPLASTY Right 02/04/2017  Procedure: PERIPHERAL VASCULAR BALLOON ANGIOPLASTY;  Surgeon: Waynetta Sandy, MD;  Location: [place] [facility];  Service: Cardiovascular;  Laterality: Right;  PERIPHERAL VASCULAR BALLOON ANGIOPLASTY Right 11/18/2019  Procedure: PERIPHERAL VASCULAR BALLOON ANGIOPLASTY;  Surgeon: [name], MD;  Location: [place] [facility];  Service: Cardiovascular;  Laterality: Right;  anterior tibial  PERIPHERAL VASCULAR BALLOON ANGIOPLASTY  02/23/2020  Procedure: PERIPHERAL VASCULAR BALLOON ANGIOPLASTY;  Surgeon: [name], MD;  Location: [place] [facility];  Service: Cardiovascular;;  Lt  AT  PERIPHERAL VASCULAR INTERVENTION Right 08/10/2020  Procedure: PERIPHERAL VASCULAR INTERVENTION;  Surgeon: [name], MD;  Location: [place] [facility];  Service: Cardiovascular;  Laterality: Right;  THROMBECTOMY BRACHIAL ARTERY Left 11/04/2020  Procedure: THROMBECTOMY RADIAL ARTERY with Bovine Patching.;  Surgeon: Cherre Robins, MD;  Location: [place];  Service: Vascular;  Laterality: Left;  TRANSMETATARSAL AMPUTATION Left 02/24/2020  Procedure: TRANSMETATARSAL AMPUTATION LEFT;  Surgeon: Cherre Robins, MD;  Location: [place];  Service: Vascular;  Laterality: Left;  TRANSMETATARSAL AMPUTATION Right 08/11/2020  Procedure: RIGHT TRANSMETATARSAL AMPUTATION;  Surgeon: Waynetta Sandy, MD;  [facility];  Service: Vascular;  Laterality: Right;  TUBAL LIGATION   HPI: Pt is a 65 yo female presented 7/1 with septic shock from PNA, requiring intubation (7/3-7/11) and CRRT (started 7/3). Brief PEA 7/3.  PMH includes: CHF, CKD, T2DM, ESRD, HLD, HTN  Subjective: alert, cooperative, a little confused Assessment / Plan / Recommendation CHL IP CLINICAL IMPRESSIONS 11/09/2020 Clinical Impression Pt has a mild oropharyngeal dysphagia, with oral phase functional for most consistencies but with inadequate mastication of solids that leaves significant lingual residue. Otherwise, she propels liquid boluses in particularly very quickly to her pharynx. Thin liquids move so quickly that she spills them directly into her airway before the swallow, resulting in sensed aspiration. Although view is somewhat limited by shoulders, this does not appear  to clear. Her hyoid excursion and epiglottic inversion are also mildly reduced, although inversion improves with the weight of heavier boluses. Mild-moderate residue remains in the valleculae with heavier consistencies as well though. Recommend starting with Dys 1 (puree) diet and nectar thick liquids. SLP Visit Diagnosis Dysphagia, oropharyngeal phase (R13.12) Attention and concentration deficit following -- Frontal lobe and executive function deficit following -- Impact on safety and function Mild aspiration risk   CHL IP TREATMENT RECOMMENDATION 11/09/2020 Treatment Recommendations Therapy as outlined in treatment plan below   Prognosis 11/09/2020 Prognosis for Safe Diet Advancement Good Barriers to Reach Goals Cognitive deficits Barriers/Prognosis Comment -- CHL IP DIET RECOMMENDATION 11/09/2020 SLP Diet Recommendations Dysphagia 1 (Puree) solids;Nectar thick liquid Liquid Administration via Cup;Straw Medication Administration Crushed with puree Compensations Minimize environmental distractions;Slow rate;Small sips/bites Postural Changes Seated upright at 90 degrees   CHL IP OTHER RECOMMENDATIONS 11/09/2020 Recommended Consults -- Oral Care Recommendations Oral care BID Other Recommendations Prohibited food (jello, ice cream, thin soups);Remove water pitcher   CHL IP FOLLOW UP  RECOMMENDATIONS 11/09/2020 Follow up Recommendations Home health SLP   CHL IP FREQUENCY AND DURATION 11/09/2020 Speech Therapy Frequency (ACUTE ONLY) min 2x/week Treatment Duration 2 weeks      CHL IP ORAL PHASE 11/09/2020 Oral Phase Impaired Oral - Pudding Teaspoon -- Oral - Pudding Cup -- Oral - Honey Teaspoon -- Oral - Honey Cup -- Oral - Nectar Teaspoon WFL Oral - Nectar Cup WFL Oral - Nectar Straw WFL Oral - Thin Teaspoon WFL Oral - Thin Cup -- Oral - Thin Straw -- Oral - Puree WFL Oral - Mech Soft -- Oral - Regular Impaired mastication;Lingual/palatal residue Oral - Multi-Consistency -- Oral - Pill -- Oral Phase - Comment --  CHL IP PHARYNGEAL PHASE 11/09/2020 Pharyngeal Phase Impaired Pharyngeal- Pudding Teaspoon -- Pharyngeal -- Pharyngeal- Pudding Cup -- Pharyngeal -- Pharyngeal- Honey Teaspoon -- Pharyngeal -- Pharyngeal- Honey Cup -- Pharyngeal -- Pharyngeal- Nectar Teaspoon Reduced anterior laryngeal mobility Pharyngeal -- Pharyngeal- Nectar Cup Reduced anterior laryngeal mobility Pharyngeal -- Pharyngeal- Nectar Straw Reduced anterior laryngeal mobility Pharyngeal -- Pharyngeal- Thin Teaspoon Reduced anterior laryngeal mobility;Reduced epiglottic inversion;Penetration/Aspiration before swallow Pharyngeal Material enters airway, passes BELOW cords and not ejected out despite cough attempt by patient Pharyngeal- Thin Cup -- Pharyngeal -- Pharyngeal- Thin Straw -- Pharyngeal -- Pharyngeal- Puree Reduced anterior laryngeal mobility;Pharyngeal residue - valleculae Pharyngeal -- Pharyngeal- Mechanical Soft -- Pharyngeal -- Pharyngeal- Regular Reduced anterior laryngeal mobility;Pharyngeal residue - valleculae Pharyngeal -- Pharyngeal- Multi-consistency -- Pharyngeal -- Pharyngeal- Pill -- Pharyngeal -- Pharyngeal Comment --  CHL IP CERVICAL ESOPHAGEAL PHASE 11/09/2020 Cervical Esophageal Phase WFL Pudding Teaspoon -- Pudding Cup -- Honey Teaspoon -- Honey Cup -- Nectar Teaspoon -- Nectar Cup -- Nectar Straw --  Thin Teaspoon -- Thin Cup -- Thin Straw -- Puree -- Mechanical Soft -- Regular -- Multi-consistency -- Pill -- Cervical Esophageal Comment -- Osie Bond., M.A. CCC-SLP Acute Rehabilitation Services Pager [phone] Office [phone] 11/09/2020, 9:17 AM               Medications: Infusions:  sodium chloride Stopped (11/07/20 1143)   sodium chloride     sodium chloride 100 mL (11/09/20 0116)   dextrose Stopped (11/08/20 1221)   feeding supplement (VITAL 1.5 CAL) 1,000 mL (11/08/20 1211)   heparin 1,400 Units/hr (11/09/20 0650)    Scheduled Medications:  amiodarone  400 mg Oral BID   Followed by   [name] ON 11/13/2020] amiodarone  200 mg Oral Daily   aspirin  81 mg  Per Tube Daily   atorvastatin  40 mg Per Tube Daily   chlorhexidine  15 mL Mouth Rinse BID   Chlorhexidine Gluconate Cloth  6 each Topical Q0600   Chlorhexidine Gluconate Cloth  6 each Topical Q0600   clopidogrel  75 mg Per Tube Daily   darbepoetin (ARANESP) injection - DIALYSIS  100 mcg Intravenous Q Tue-HD   feeding supplement (PROSource TF)  45 mL Per Tube TID   insulin aspart  3-9 Units Subcutaneous Q4H   mouth rinse  15 mL Mouth Rinse q12n4p   midodrine  5 mg Per Tube TID WC   multivitamin  1 tablet Oral QHS   polyethylene glycol  17 g Per Tube Daily   sodium chloride flush  10-40 mL Intracatheter Q12H    have reviewed scheduled and prn medications.  Physical Exam: General: Chronically ill appearing, but in no distress Heart:RRR, s1s2 nl Lungs: [place], b/l chest expansion Abdomen:soft,  non-distended Extremities: Bilateral BKA, no stump/thigh edema, left hand with several necrotic digits and hand with patchy duskiness that is stable Neuro: awake, following commands Dialysis Access: Left IJ temporary HD catheter for CRRT.  AV fistula has + thrill and bruit but bruit quiet now.   Ria Comment A Allen Basista 11/09/2020,10:20 AM  LOS: 13 days

## 2020-11-09 NOTE — H&P (Signed)
Chief Complaint: Patient was seen in consultation today for fistulogram of right AV graft Chief Complaint  Patient presents with   Altered Mental Status   at the request of Dr. Johnney Ou, L.  Referring Physician(s): Dr. Johnney Ou, L.  Supervising Physician: Corrie Mckusick  Patient Status: [facility] - In-pt  History of Present Illness: Christy Zhang is a 65 y.o. female with past medical history of HTN, HLD, CHF, asthma, DM type II, PVD, ESRD on hemodialysis s/p right AV graft placement in 2020, who is currently admitted to [facility] due to septic shock, respiratory failure, and hypoxemia on 10/27/2020.  Patient was on empiric vancomycin and Flagyl at the time of admission.  And is known to our service for previous hemodialysis catheter placement and fistulogram, last IR intervention was done on 10/12/2019, declot of right AV graft and overlapping self-expanding stent placement on the right subclavian and axillary veins with Dr. Kathlene Cote. Septic shock was thought to be most likely due to left lung pneumonia, patient developed respiratory failure and  was intubated and has been on pressor support per critical care and antibiotic treatment per primary care. Chest x-ray and CT abdomen pelvis did not show any clear etiology of sepsis. Blood culture was obtained on 10/27/2020 showed no growth in 5 days, respiratory panel was negative for COVID or flu, repeat blood culture on 11/02/2020 again showed no growth in 5 days.  Patient underwent CRRT which was discontinued on 11/07/2020.  Patient developed A. fib with ST elevation noted on EKG 11/03/2020, cardiology was consulted and she is currently being treated with amiodarone and IV heparin.  Patient was weaned off pressors and extubated on 11/07/2020, was transported to medical unit from ICU.   Patient underwent hemodialysis today, of note, aVF was not usable for HD.   IR was requested for fistulogram of the right AV graft.  Patient was evaluated at the bedside, she appears  lethargic but easily arousable with verbal stimuli. She states that she does not know why her AV graft could not be used for hemodialysis today.  States no complaint of her right arm, no swelling, no pain.  Denise headache, fever, chills, shortness of breath, cough, chest pain, abdominal pain, nausea ,vomiting, and bleeding.   Past Medical History:  Diagnosis Date   Anemia    Arthritis    Asthma    in the past   CHF (congestive heart failure) ([place])    Diabetes mellitus with complication ([place])    Type 2   End stage renal disease on dialysis ([place])    Gangrene ([place])    Hyperlipidemia    Hypertension     Past Surgical History:  Procedure Laterality Date    LEFT BELOW KNEE AMPUTATION (Left Knee)  06/08/2020   A/V FISTULAGRAM Right 02/04/2017   Procedure: A/V Fistulagram;  Surgeon: Waynetta Sandy, MD;  Location: [place] [facility];  Service: Cardiovascular;  Laterality: Right;   ABDOMINAL AORTOGRAM W/LOWER EXTREMITY N/A 11/18/2019   Procedure: ABDOMINAL AORTOGRAM W/LOWER EXTREMITY;  Surgeon: [name], MD;  Location: [place] [facility];  Service: Cardiovascular;  Laterality: N/A;   ABDOMINAL AORTOGRAM W/LOWER EXTREMITY N/A 02/23/2020   Procedure: ABDOMINAL AORTOGRAM W/LOWER EXTREMITY;  Surgeon: [name], MD;  Location: [place] [facility];  Service: Cardiovascular;  Laterality: N/A;  Bilateral   ABDOMINAL AORTOGRAM W/LOWER EXTREMITY N/A 08/10/2020   Procedure: ABDOMINAL AORTOGRAM W/LOWER EXTREMITY;  Surgeon: [name], MD;  Location: [place] [facility];  Service: Cardiovascular;  Laterality:  N/A;   AMPUTATION Left 06/08/2020   Procedure: LEFT BELOW KNEE AMPUTATION;  Surgeon: Cherre Robins, MD;  Location: [place];  Service: Vascular;  Laterality: Left;   AMPUTATION Right 09/14/2020   Procedure: RIGHT BELOW KNEE AMPUTATION;  Surgeon: Waynetta Sandy, MD;  Location: [place];  Service: Vascular;  Laterality: Right;   AV FISTULA PLACEMENT Right  08/25/2014   Procedure: RIGHT BRACHIOCEPHALIC ARTERIOVENOUS (AV) FISTULA CREATION;  Surgeon: Mal Misty, MD;  Location: [place];  Service: Vascular;  Laterality: Right;   AV FISTULA PLACEMENT Right 03/18/2019   Procedure: INSERTION OF ARTERIOVENOUS (AV) GORE-TEX GRAFT RIGHT ARM;  Surgeon: Serafina Mitchell, MD;  Location: [place];  Service: Vascular;  Laterality: Right;   [place] Right 12/24/2018   Procedure: FIRST STAGE BASILIC VEIN TRANSPOSITION RIGHT UPPER ARM;  Surgeon: Serafina Mitchell, MD;  Location: [place];  Service: Vascular;  Laterality: Right;   FISTULA SUPERFICIALIZATION Right 11/03/2014   Procedure: RIGHT UPPER ARM FISTULA SUPERFICIALIZATION;  Surgeon: Mal Misty, MD;  Location: [place];  Service: Vascular;  Laterality: Right;   FISTULOGRAM Right 01/16/2016   Procedure: RIGHT UPPER EXTREMITY ARTERIOVENOUS FISTULOGRAM WITH BALLOON ANGIOPLASTY;  Surgeon: Vickie Epley, MD;  Location: [facility];  Service: Vascular;  Laterality: Right;   INSERTION OF DIALYSIS CATHETER     X4   IR FLUORO GUIDE CV LINE RIGHT  12/03/2018   IR REMOVAL TUN CV CATH W/O FL  07/06/2019   IR THROMBECTOMY AV FISTULA W/THROMBOLYSIS/PTA INC/SHUNT/IMG RIGHT Right 12/03/2018   IR THROMBECTOMY AV FISTULA W/THROMBOLYSIS/PTA/STENT INC/SHUNT/IMG RT Right 10/11/2019   IR US GUIDE VASC ACCESS RIGHT  12/03/2018   IR US GUIDE VASC ACCESS RIGHT  12/03/2018   IR US GUIDE VASC ACCESS RIGHT  10/14/2019   LIGATION OF COMPETING BRANCHES OF ARTERIOVENOUS FISTULA Right 11/03/2014   Procedure: LIGATION OF COMPETING BRANCHE x1 OF RIGHT UPPER ARM ARTERIOVENOUS FISTULA;  Surgeon: Mal Misty, MD;  Location: [place];  Service: Vascular;  Laterality: Right;   PERIPHERAL VASCULAR BALLOON ANGIOPLASTY Right 02/04/2017   Procedure: PERIPHERAL VASCULAR BALLOON ANGIOPLASTY;  Surgeon: Waynetta Sandy, MD;  Location: [place] [facility];  Service: Cardiovascular;  Laterality: Right;   PERIPHERAL VASCULAR BALLOON ANGIOPLASTY Right  11/18/2019   Procedure: PERIPHERAL VASCULAR BALLOON ANGIOPLASTY;  Surgeon: [name], MD;  Location: [place] [facility];  Service: Cardiovascular;  Laterality: Right;  anterior tibial   PERIPHERAL VASCULAR BALLOON ANGIOPLASTY  02/23/2020   Procedure: PERIPHERAL VASCULAR BALLOON ANGIOPLASTY;  Surgeon: [name], MD;  Location: [place] [facility];  Service: Cardiovascular;;  Lt  AT   PERIPHERAL VASCULAR INTERVENTION Right 08/10/2020   Procedure: PERIPHERAL VASCULAR INTERVENTION;  Surgeon: [name], MD;  Location: [place] [facility];  Service: Cardiovascular;  Laterality: Right;   THROMBECTOMY BRACHIAL ARTERY Left 11/04/2020   Procedure: THROMBECTOMY RADIAL ARTERY with Bovine Patching.;  Surgeon: Cherre Robins, MD;  Location: [place];  Service: Vascular;  Laterality: Left;   TRANSMETATARSAL AMPUTATION Left 02/24/2020   Procedure: TRANSMETATARSAL AMPUTATION LEFT;  Surgeon: Cherre Robins, MD;  Location: [place];  Service: Vascular;  Laterality: Left;   TRANSMETATARSAL AMPUTATION Right 08/11/2020   Procedure: RIGHT TRANSMETATARSAL AMPUTATION;  Surgeon: Waynetta Sandy, MD;  Location: [place];  Service: Vascular;  Laterality: Right;   TUBAL LIGATION      Allergies: Sulfa antibiotics  Medications: Prior to Admission medications   Medication Sig Start Date End Date Taking? Authorizing Provider  acetaminophen (TYLENOL)  325 MG tablet Take 1-2 tablets (325-650 mg total) by mouth every 4 (four) hours as needed for mild pain. 07/04/20  Yes Love, Ivan Anchors, PA-C  amLODipine (NORVASC) 5 MG tablet TAKE ONE TABLET BY MOUTH AT BEDTIME Patient taking differently: Take 5 mg by mouth daily. 09/06/20  Yes Claretta Fraise, MD  aspirin EC 81 MG tablet Take 81 mg by mouth at bedtime. Swallow whole.   Yes [provider]  atorvastatin (LIPITOR) 40 MG tablet TAKE ONE TABLET BY MOUTH DAILY Patient taking differently: at bedtime. 08/21/20  Yes Claretta Fraise, MD  clopidogrel  (PLAVIX) 75 MG tablet Take 1 tablet (75 mg total) by mouth daily. Patient taking differently: Take 75 mg by mouth every evening. 11/18/19 11/17/20 Yes [name], MD  diphenhydrAMINE (BENADRYL) 25 MG tablet Take 25 mg by mouth every 6 (six) hours as needed for itching.   Yes [provider]  gabapentin (NEURONTIN) 300 MG capsule Take 1 capsule (300 mg total) by mouth at bedtime. 07/27/20  Yes Bayard Hugger, NP  insulin detemir (LEVEMIR) 100 UNIT/ML injection Inject 0.16 mLs (16 Units total) into the skin daily. 08/31/20  Yes [name], [name], MD  metoprolol succinate (TOPROL-XL) 50 MG 24 hr tablet TAKE ONE TABLET BY MOUTH DAILY. TAKE WITH OR IMMEDIATELY FOLLOWING A MEAL Patient taking differently: Take 50 mg by mouth at bedtime. 09/06/20  Yes Claretta Fraise, MD  oxyCODONE (OXY IR/ROXICODONE) 5 MG immediate release tablet Take 1 tablet (5 mg total) by mouth every 6 (six) hours as needed for severe pain. 09/18/20  Yes Elgergawy, Silver Huguenin, MD  senna-docusate (SENOKOT-S) 8.6-50 MG tablet Take 1 tablet by mouth 2 (two) times daily. Patient taking differently: Take 1 tablet by mouth 2 (two) times daily as needed for mild constipation. 07/04/20  Yes Love, Ivan Anchors, PA-C  sevelamer carbonate (RENVELA) 800 MG tablet Take 1,600-2,400 mg by mouth See admin instructions. Take 2400 mg with each meal and 1600 mg with each snack   Yes [provider]  TRULICITY 1.5 0000000 SOPN INJECT CONTENTs OF ONE PEN UNDER THE SKIN WEEKLY ON SATURDAY Patient taking differently: Inject 1.5 mg into the skin every Saturday. 08/24/20  Yes Claretta Fraise, MD  Insulin Syringe-Needle U-100 28G X 1/2" 1 ML MISC Use with insulin daily Dx E11.22 08/31/20   Claretta Fraise, MD  multivitamin (RENA-VIT) TABS tablet Take 1 tablet by mouth at bedtime.  Patient not taking: No sig reported    [provider]  polyethylene glycol (MIRALAX / GLYCOLAX) 17 g packet Take 17 g by mouth daily. Patient not taking: No sig  reported 06/17/20   Regalado, Cassie Freer, MD     Family History  Problem Relation Age of Onset   Diabetes Mother    Hypertension Mother    Cancer Father    Diabetes Father    Hypertension Sister    Diabetes Daughter    Heart disease Daughter    Hypertension Daughter    Diabetes Son    Heart disease Son    Hypertension Son    Peripheral vascular disease Son        amputation    Social History   Socioeconomic History   Marital status: Widowed    Spouse name: Not on file   Number of children: Not on file   Years of education: Not on file   Highest education level: Not on file  Occupational History   Not on file  Tobacco Use   Smoking status: Former  Types: Cigarettes    Quit date: 07/25/1984    Years since quitting: 36.3   Smokeless tobacco: Never  Vaping Use   Vaping Use: Never used  Substance and Sexual Activity   Alcohol use: No    Alcohol/week: 0.0 standard drinks   Drug use: No   Sexual activity: Not on file  Other Topics Concern   Not on file  Social History Narrative   Not on file   Social Determinants of Health   Financial Resource Strain: Low Risk    Difficulty of Paying Living Expenses: Not very hard  Food Insecurity: No Food Insecurity   Worried About Running Out of Food in the Last Year: Never true   Ran Out of Food in the Last Year: Never true  Transportation Needs: No Transportation Needs   Lack of Transportation (Medical): No   Lack of Transportation (Non-Medical): No  Physical Activity: Inactive   Days of Exercise per Week: 0 days   Minutes of Exercise per Session: 0 min  Stress: No Stress Concern Present   Feeling of Stress : Only a little  Social Connections: Socially Isolated   Frequency of Communication with Friends and Family: More than three times a week   Frequency of Social Gatherings with Friends and Family: More than three times a week   Attends Religious Services: Never   Marine scientist or Organizations: No   Attends  Archivist Meetings: Never   Marital Status: Widowed     Review of Systems: A 12 point ROS discussed and pertinent positives are indicated in the HPI above.  All other systems are negative.   Vital Signs: BP (!) 156/39 (BP Location: Left Wrist)   Pulse 87   Temp 98.3 F (36.8 C) (Oral)   Resp 18   Ht '5\' 4"'$  (1.626 m)   Wt 187 lb 6.3 oz (85 kg)   LMP  (LMP Unknown)   SpO2 100%   BMI 32.17 kg/m   Physical Exam Vitals reviewed.  Constitutional:      General: She is not in acute distress.    Appearance: She is ill-appearing.  HENT:     Head: Normocephalic and atraumatic.     Nose:     Comments: Positive for feeding tube on left nare    Mouth/Throat:     Mouth: Mucous membranes are moist.  Cardiovascular:     Rate and Rhythm: Normal rate and regular rhythm.     Pulses: Normal pulses.     Heart sounds: Normal heart sounds.  Pulmonary:     Effort: Pulmonary effort is normal.     Breath sounds: Normal breath sounds.  Abdominal:     General: Abdomen is flat. Bowel sounds are normal.     Palpations: Abdomen is soft.  Skin:    General: Skin is warm and dry.     Coloration: Skin is not jaundiced or pale.     Comments: Negative for swelling on right upper arm, no excessive warmth, no TTP  Neurological:     Mental Status: She is oriented to person, place, and time.  Psychiatric:        Mood and Affect: Mood normal.        Behavior: Behavior normal.        Judgment: Judgment normal.    MD Evaluation Airway: WNL Heart: WNL Abdomen: WNL Chest/ Lungs: WNL ASA  Classification: 3 Mallampati/Airway Score: Two  Imaging: DG Chest 1 View  Result Date: 10/29/2020 CLINICAL DATA:  Cardiac arrest. EXAM: CHEST  1 VIEW COMPARISON:  Earlier today. FINDINGS: Endotracheal tube tip 2.4 cm from the carina. Enteric tube in place with tip and side-port below the diaphragm. Left-sided dialysis catheter is left central line in the SVC. Stable cardiomegaly. Unchanged mediastinal  contours. Improving aeration with decreasing bilateral pulmonary opacities. Improving bilateral pleural effusions, left greater than right. Persistent retrocardiac opacity. No pneumothorax. Vascular stent in the right axilla. IMPRESSION: 1. Endotracheal tube tip 2.4 cm from the carina. Stable enteric tube and left central line. 2. Improving aeration with decreasing bilateral pulmonary opacities, likely improving pulmonary edema. Improving pleural effusions. Persistent dense retrocardiac opacity. 3. Stable cardiomegaly. Electronically Signed   By: Keith Rake M.D.   On: 10/29/2020 16:11   DG Abd 1 View  Result Date: 11/04/2020 CLINICAL DATA:  Check gastric catheter placement EXAM: ABDOMEN - 1 VIEW COMPARISON:  10/29/2020 FINDINGS: Gastric catheter is noted within the stomach. Diffuse vascular calcifications are seen. No obstructive pattern is noted. IMPRESSION: Gastric catheter within the stomach. Electronically Signed   By: Inez Catalina M.D.   On: 11/04/2020 22:24   DG Abd 1 View  Result Date: 10/29/2020 CLINICAL DATA:  Status post or gastric feeding tube placement. EXAM: ABDOMEN - 1 VIEW COMPARISON:  Chest x-ray today FINDINGS: Enteric tube has been placed, tip overlying the level of the proximal to mid stomach. There is persistent dense opacity in the LEFT LOWER lobe. Paucity of bowel gas. IMPRESSION: Enteric tube tip overlying the level of the stomach. Electronically Signed   By: [name] M.D.   On: 10/29/2020 12:15   CT CHEST ABDOMEN PELVIS W CONTRAST  Result Date: 11/02/2020 CLINICAL DATA:  Sepsis.  Elevated white count. EXAM: CT CHEST, ABDOMEN, AND PELVIS WITH CONTRAST TECHNIQUE: Multidetector CT imaging of the chest, abdomen and pelvis was performed following the standard protocol during bolus administration of intravenous contrast. CONTRAST:  155m OMNIPAQUE IOHEXOL 300 MG/ML  SOLN COMPARISON:  December 29, 2019. FINDINGS: CT CHEST FINDINGS Cardiovascular: Atherosclerosis of thoracic  aorta is noted without aneurysm or dissection. Coronary artery calcifications are noted. Normal cardiac size. Minimal pericardial effusion is noted. Mediastinum/Nodes: Thyroid gland is unremarkable. Endotracheal tube is directed slightly into right mainstem bronchus; withdrawal by 2-3 cm is recommended. Nasogastric tube is seen passing through esophagus and into stomach. No adenopathy is noted. Lungs/Pleura: No pneumothorax is noted. Small right pleural effusion is noted with adjacent subsegmental atelectasis of the right lower lobe. Pigtail chest tube is noted in the left hemithorax. Musculoskeletal: No chest wall mass or suspicious bone lesions identified. CT ABDOMEN PELVIS FINDINGS Hepatobiliary: No gallstones or biliary dilatation is noted. Mild gallbladder distention is now noted. The liver is unremarkable. Pancreas: Unremarkable. No pancreatic ductal dilatation or surrounding inflammatory changes. Spleen: Normal in size without focal abnormality. Adrenals/Urinary Tract: Adrenal glands appear normal. Bilateral renal cysts are noted. 3.5 cm heterogeneously enhancing abnormality is noted arising from lower pole of left kidney concerning for possible renal cell carcinoma. No hydronephrosis or renal obstruction is noted. No renal or ureteral calculi are noted. Urinary bladder is unremarkable. Stomach/Bowel: Nasogastric tube tip is seen in proximal stomach. There is no evidence of bowel obstruction or inflammation. The appendix appears normal. Stool is noted throughout the colon. Vascular/Lymphatic: Aortic atherosclerosis. No enlarged abdominal or pelvic lymph nodes. Reproductive: Uterus and bilateral adnexa are unremarkable. Other: No abdominal wall hernia or abnormality. No abdominopelvic ascites. Musculoskeletal: No acute or significant osseous findings. IMPRESSION: 3.5 cm heterogeneously enhancing abnormality is again noted arising from  lower pole of left kidney concerning for possible neoplasm. Further  evaluation with MRI with and without gadolinium is recommended. Endotracheal tube is directed slightly into right mainstem bronchus; withdrawal by 2-3 cm is recommended. Small right pleural effusion is noted with adjacent subsegmental atelectasis of right lower lobe. Pigtail catheter is noted in left hemithorax. No pneumothorax is noted. Mild gallbladder distention is now noted. No cholelithiasis or inflammatory changes noted. Aortic Atherosclerosis (ICD10-I70.0). Electronically Signed   By: Marijo Conception M.D.   On: 11/02/2020 16:46   DG CHEST PORT 1 VIEW  Result Date: 11/04/2020 CLINICAL DATA:  Pneumonia EXAM: PORTABLE CHEST 1 VIEW COMPARISON:  Chest x-ray dated 11/03/2020. FINDINGS: Heart size and mediastinal contours are stable. Endotracheal tube is well positioned with tip just above the level the carina. LEFT IJ central line is stable in position with tip at the level of the mid SVC. Lungs appear clear.  No pleural effusion or pneumothorax is seen. IMPRESSION: 1. No active disease. No evidence of pneumonia or pulmonary edema. No pneumothorax. 2. Endotracheal tube and LEFT IJ central line are stable in position. Electronically Signed   By: Franki Cabot M.D.   On: 11/04/2020 08:29   DG CHEST PORT 1 VIEW  Result Date: 11/03/2020 CLINICAL DATA:  ETT present, altered mental status EXAM: PORTABLE CHEST 1 VIEW COMPARISON:  11/02/2020 FINDINGS: Endotracheal tube with the tip 3.2 cm above the carina. Nasogastric tube coursing below the diaphragm. Left jugular central venous catheter with the tip projecting over the upper SVC. Right subclavian stent again noted. Interval removal of the left-sided chest tube. No focal consolidation. No pleural effusion or pneumothorax. Stable cardiomediastinal silhouette. No acute osseous abnormality. IMPRESSION: 1. Interval removal of the left-sided chest tube. No pneumothorax. 2. Endotracheal tube with the tip 3.2 cm above the carina. Electronically Signed   By: Kathreen Devoid    On: 11/03/2020 09:04   DG Chest Port 1 View  Result Date: 11/02/2020 CLINICAL DATA:  Intubation, altered mental status EXAM: PORTABLE CHEST 1 VIEW COMPARISON:  Portable exam 0528 hours compared to 10/31/2020 FINDINGS: Tip of endotracheal tube projects 2.0 cm above carina. Nasogastric tube extends into stomach. Pigtail LEFT thoracostomy tube present. LEFT jugular dual-lumen central venous catheter with tip projecting over SVC at the level of the azygos confluence. Vascular stents at the RIGHT subclavian/axillary vessels. Normal heart size and mediastinal contours. Atherosclerotic calcification aorta. Mild RIGHT basilar atelectasis. Lungs otherwise clear. No pleural effusion or pneumothorax. IMPRESSION: Mild RIGHT basilar atelectasis. Electronically Signed   By: Lavonia Dana M.D.   On: 11/02/2020 08:44   DG Chest Port 1 View  Result Date: 10/31/2020 CLINICAL DATA:  Chest tube placement EXAM: PORTABLE CHEST 1 VIEW COMPARISON:  10/31/2020 FINDINGS: Endotracheal tube overlies the distal trachea, 8 mm above the carina. Left neck catheter tip overlies the there is a new left basilar chest tube with significantly decreased size of the left pleural effusion. Unchanged right pleural effusion. Unchanged cardiomediastinal silhouette. Unchanged right axillary stent. Esophageal temperature probe overlies the thoracic inlet. Bones are unchanged. IMPRESSION: Significantly decreased size of the left pleural effusion after chest tube placement. Unchanged cardiomegaly and right pleural effusion. Endotracheal tube tip is again seen in the distal trachea, 8 mm above the carina, consider retraction by 2.0 cm. Electronically Signed   By: Maurine Simmering   On: 10/31/2020 18:43   DG CHEST PORT 1 VIEW  Result Date: 10/31/2020 CLINICAL DATA:  Pleural effusion history of CHF. History of asthma. EXAM: PORTABLE CHEST  1 VIEW COMPARISON:  10/29/2020. FINDINGS: Endotracheal tube noted with tip 1 cm above the carina. Proximal repositioning of  approximately 2 cm should be considered. NG tube and left IJ line in stable position. Cardiomegaly with bilateral pulmonary infiltrates/edema and bilateral pleural effusions. Findings consistent with CHF. No pneumothorax. Right subclavian vascular stent noted. IMPRESSION: 1. Endotracheal tube noted with its tip 1 cm above the carina. Proximal repositioning of approximately 2 cm should be considered. NG tube left IJ line in stable position. 2. Cardiomegaly with bilateral pulmonary infiltrates/edema bilateral pleural effusions. Findings consistent CHF. Electronically Signed   By: Marcello Moores  Register   On: 10/31/2020 12:45   DG Chest Port 1 View  Result Date: 10/29/2020 CLINICAL DATA:  65 year old female with hypoxia.  Intubated. EXAM: PORTABLE CHEST 1 VIEW COMPARISON:  10/28/2020 and earlier. FINDINGS: Portable AP semi upright view at 0446 hours. Endotracheal tube in good position projecting over the airway between the clavicles and carina. A left chest approach vascular catheter projects at the cavoatrial junction region. Mildly improved lung volumes and decreased but not resolved veiling opacity in the left hemithorax with continued obscuration of the left hemidiaphragm. Dense retrocardiac opacity. Probable cardiomegaly. Improved right lung base ventilation. No pneumothorax. Paucity of bowel gas in the upper abdomen. IMPRESSION: 1. Endotracheal tube in good position. Left chest approach central line tip in good position near the cavoatrial junction. 2. Mildly improved lung volumes and ventilation. Continued left greater than right pleural effusions, with left lower lobe collapse or consolidation. Electronically Signed   By: Genevie Ann M.D.   On: 10/29/2020 05:13   DG CHEST PORT 1 VIEW  Result Date: 10/28/2020 CLINICAL DATA:  Hypoxemia. EXAM: PORTABLE CHEST 1 VIEW COMPARISON:  Radiograph yesterday. FINDINGS: Cardiomegaly. Worsening pleural effusions. Retrocardiac opacity is partially obscured by pleural fluid on the  current exam. Lower lung volumes likely due to increasing compressive atelectasis. Increasing vascular congestion. Vascular stent in the region of the right subclavian. No pneumothorax. IMPRESSION: 1. Worsening pleural effusions and vascular congestion. Left pleural effusion obscures retrocardiac opacity. 2. Lower lung volumes likely due to increasing compressive atelectasis. 3. Cardiomegaly. Electronically Signed   By: Keith Rake M.D.   On: 10/28/2020 22:32   DG Chest Port 1 View  Result Date: 10/27/2020 CLINICAL DATA:  Confused after dialysis, history of asthma EXAM: PORTABLE CHEST 1 VIEW COMPARISON:  06/22/2020 FINDINGS: Dense retrocardiac and left mid lung opacity partially silhouetting portion of the left heart border. Could reflect a combination of volume loss and airspace consolidation in the lingula. Remaining portions the lungs are otherwise clear aside from basilar atelectasis. Visible portions of the cardiac silhouette suggests some mild cardiomegaly accounting for portable technique. The aorta is calcified. The remaining cardiomediastinal contours are unremarkable. Vascular stenting noted in the right subclavian to axillary vessels. Telemetry leads overlie the chest. Degenerative changes are present in the imaged spine and shoulders. IMPRESSION: Dense retrocardiac and left mid lung opacity partially silhouetting the left heart border. Could reflect acute consolidation or pneumonia with volume loss in the lingula. Cardiomegaly Aortic Atherosclerosis (ICD10-I70.0). Electronically Signed   By: Lovena Le M.D.   On: 10/27/2020 19:46   DG Abd Portable 1V  Result Date: 11/08/2020 CLINICAL DATA:  Evaluate feeding tube placement EXAM: PORTABLE ABDOMEN - 1 VIEW COMPARISON:  11/04/2020 FINDINGS: Interval placement of feeding tube. The tip is in the projection of the distal stomach/pylorus. Distal portion of left IJ catheter is again noted projecting over the cavoatrial junction. IMPRESSION: Interval  placement of feeding  tube with tip in the distal stomach/pylorus. Electronically Signed   By: Kerby Moors M.D.   On: 11/08/2020 11:35   DG Swallowing Func-Speech Pathology  Result Date: 11/09/2020 Formatting of this result is different from the original. Objective Swallowing Evaluation: Type of Study: MBS-Modified Barium Swallow Study  Patient Details Name: BLOSSIE HOWTON MRN: [id number] Date of Birth: [birth year] Today's Date: 11/09/2020 Time: SLP Start Time (ACUTE ONLY): [id number] -SLP Stop Time (ACUTE ONLY): 0840 SLP Time Calculation (min) (ACUTE ONLY): 12 min Past Medical History: Past Medical History: Diagnosis Date  Anemia   Arthritis   Asthma   in the past  CHF (congestive heart failure) ([place])   Diabetes mellitus with complication ([place])   Type 2  End stage renal disease on dialysis ([place])   Gangrene ([place])   Hyperlipidemia   Hypertension  Past Surgical History: Past Surgical History: Procedure Laterality Date   LEFT BELOW KNEE AMPUTATION (Left Knee)  06/08/2020  A/V FISTULAGRAM Right 02/04/2017  Procedure: A/V Fistulagram;  Surgeon: Waynetta Sandy, MD;  Location: [facility];  Service: Cardiovascular;  Laterality: Right;  ABDOMINAL AORTOGRAM W/LOWER EXTREMITY N/A 11/18/2019  Procedure: ABDOMINAL AORTOGRAM W/LOWER EXTREMITY;  Surgeon: [name], MD;  Location: [place] [facility] [facility];  Service: Cardiovascular;  Laterality: N/A;  ABDOMINAL AORTOGRAM W/LOWER EXTREMITY N/A 02/23/2020  Procedure: ABDOMINAL AORTOGRAM W/LOWER EXTREMITY;  Surgeon: [name], MD;  Location: [place] [facility];  Service: Cardiovascular;  Laterality: N/A;  Bilateral  ABDOMINAL AORTOGRAM W/LOWER EXTREMITY N/A 08/10/2020  Procedure: ABDOMINAL AORTOGRAM W/LOWER EXTREMITY;  Surgeon: [name], MD;  Location: [facility];  Service: Cardiovascular;  Laterality: N/A;  AMPUTATION Left 06/08/2020  Procedure: LEFT BELOW KNEE AMPUTATION;  Surgeon: Cherre Robins, MD;  Location: [place];  Service:  Vascular;  Laterality: Left;  AMPUTATION Right 09/14/2020  Procedure: RIGHT BELOW KNEE AMPUTATION;  Surgeon: Waynetta Sandy, MD;  Location: [place];  Service: Vascular;  Laterality: Right;  AV FISTULA PLACEMENT Right 08/25/2014  Procedure: RIGHT BRACHIOCEPHALIC ARTERIOVENOUS (AV) FISTULA CREATION;  Surgeon: Mal Misty, MD;  Location: [place];  Service: Vascular;  Laterality: Right;  AV FISTULA PLACEMENT Right 03/18/2019  Procedure: INSERTION OF ARTERIOVENOUS (AV) GORE-TEX GRAFT RIGHT ARM;  Surgeon: Serafina Mitchell, MD;  Location: [place];  Service: Vascular;  Laterality: Right;  Page Right 12/24/2018  Procedure: FIRST STAGE BASILIC VEIN TRANSPOSITION RIGHT UPPER ARM;  Surgeon: Serafina Mitchell, MD;  Location: [place];  Service: Vascular;  Laterality: Right;  FISTULA SUPERFICIALIZATION Right 11/03/2014  Procedure: RIGHT UPPER ARM FISTULA SUPERFICIALIZATION;  Surgeon: Mal Misty, MD;  Location: [place];  Service: Vascular;  Laterality: Right;  FISTULOGRAM Right 01/16/2016  Procedure: RIGHT UPPER EXTREMITY ARTERIOVENOUS FISTULOGRAM WITH BALLOON ANGIOPLASTY;  Surgeon: Vickie Epley, MD;  Location: [facility];  Service: Vascular;  Laterality: Right;  INSERTION OF DIALYSIS CATHETER    X4  IR FLUORO GUIDE CV LINE RIGHT  12/03/2018  IR REMOVAL TUN CV CATH W/O FL  07/06/2019  IR THROMBECTOMY AV FISTULA W/THROMBOLYSIS/PTA INC/SHUNT/IMG RIGHT Right 12/03/2018  IR THROMBECTOMY AV FISTULA W/THROMBOLYSIS/PTA/STENT INC/SHUNT/IMG RT Right 10/11/2019  IR US GUIDE VASC ACCESS RIGHT  12/03/2018  IR US GUIDE VASC ACCESS RIGHT  12/03/2018  IR US GUIDE VASC ACCESS RIGHT  10/14/2019  LIGATION OF COMPETING BRANCHES OF ARTERIOVENOUS FISTULA Right 11/03/2014  Procedure: LIGATION OF COMPETING BRANCHE x1 OF RIGHT UPPER ARM ARTERIOVENOUS FISTULA;  Surgeon: Mal Misty, MD;  Location: [place];  Service:  Vascular;  Laterality: Right;  PERIPHERAL VASCULAR BALLOON ANGIOPLASTY Right 02/04/2017  Procedure: PERIPHERAL VASCULAR BALLOON  ANGIOPLASTY;  Surgeon: Waynetta Sandy, MD;  Location: [place];  Service: Cardiovascular;  Laterality: Right;  PERIPHERAL VASCULAR BALLOON ANGIOPLASTY Right 11/18/2019  Procedure: PERIPHERAL VASCULAR BALLOON ANGIOPLASTY;  Surgeon: [name], MD;  Location: [place] [facility];  Service: Cardiovascular;  Laterality: Right;  anterior tibial  PERIPHERAL VASCULAR BALLOON ANGIOPLASTY  02/23/2020  Procedure: PERIPHERAL VASCULAR BALLOON ANGIOPLASTY;  Surgeon: [name], MD;  Location: [facility];  Service: Cardiovascular;;  Lt  AT  PERIPHERAL VASCULAR INTERVENTION Right 08/10/2020  Procedure: PERIPHERAL VASCULAR INTERVENTION;  Surgeon: [name], MD;  Location: [facility];  Service: Cardiovascular;  Laterality: Right;  THROMBECTOMY BRACHIAL ARTERY Left 11/04/2020  Procedure: THROMBECTOMY RADIAL ARTERY with Bovine Patching.;  Surgeon: Cherre Robins, MD;  Location: [place];  Service: Vascular;  Laterality: Left;  TRANSMETATARSAL AMPUTATION Left 02/24/2020  Procedure: TRANSMETATARSAL AMPUTATION LEFT;  Surgeon: Cherre Robins, MD;  Location: [place];  Service: Vascular;  Laterality: Left;  TRANSMETATARSAL AMPUTATION Right 08/11/2020  Procedure: RIGHT TRANSMETATARSAL AMPUTATION;  Surgeon: Waynetta Sandy, MD;  Location: [facility];  Service: Vascular;  Laterality: Right;  TUBAL LIGATION   HPI: Pt is a 65 yo female presented 7/1 with septic shock from PNA, requiring intubation (7/3-7/11) and CRRT (started 7/3). Brief PEA 7/3. PMH includes: CHF, CKD, T2DM, ESRD, HLD, HTN  Subjective: alert, cooperative, a little confused Assessment / Plan / Recommendation CHL IP CLINICAL IMPRESSIONS 11/09/2020 Clinical Impression Pt has a mild oropharyngeal dysphagia, with oral phase functional for most consistencies but with inadequate mastication of solids that leaves significant lingual residue. Otherwise, she propels liquid boluses in particularly very quickly to her  pharynx. Thin liquids move so quickly that she spills them directly into her airway before the swallow, resulting in sensed aspiration. Although view is somewhat limited by shoulders, this does not appear to clear. Her hyoid excursion and epiglottic inversion are also mildly reduced, although inversion improves with the weight of heavier boluses. Mild-moderate residue remains in the valleculae with heavier consistencies as well though. Recommend starting with Dys 1 (puree) diet and nectar thick liquids. SLP Visit Diagnosis Dysphagia, oropharyngeal phase (R13.12) Attention and concentration deficit following -- Frontal lobe and executive function deficit following -- Impact on safety and function Mild aspiration risk   CHL IP TREATMENT RECOMMENDATION 11/09/2020 Treatment Recommendations Therapy as outlined in treatment plan below   Prognosis 11/09/2020 Prognosis for Safe Diet Advancement Good Barriers to Reach Goals Cognitive deficits Barriers/Prognosis Comment -- CHL IP DIET RECOMMENDATION 11/09/2020 SLP Diet Recommendations Dysphagia 1 (Puree) solids;Nectar thick liquid Liquid Administration via Cup;Straw Medication Administration Crushed with puree Compensations Minimize environmental distractions;Slow rate;Small sips/bites Postural Changes Seated upright at 90 degrees   CHL IP OTHER RECOMMENDATIONS 11/09/2020 Recommended Consults -- Oral Care Recommendations Oral care BID Other Recommendations Prohibited food (jello, ice cream, thin soups);Remove water pitcher   CHL IP FOLLOW UP RECOMMENDATIONS 11/09/2020 Follow up Recommendations Home health SLP   CHL IP FREQUENCY AND DURATION 11/09/2020 Speech Therapy Frequency (ACUTE ONLY) min 2x/week Treatment Duration 2 weeks      CHL IP ORAL PHASE 11/09/2020 Oral Phase Impaired Oral - Pudding Teaspoon -- Oral - Pudding Cup -- Oral - Honey Teaspoon -- Oral - Honey Cup -- Oral - Nectar Teaspoon WFL Oral - Nectar Cup WFL Oral - Nectar Straw WFL Oral - Thin Teaspoon WFL Oral - Thin  Cup --  Oral - Thin Straw -- Oral - Puree WFL Oral - Mech Soft -- Oral - Regular Impaired mastication;Lingual/palatal residue Oral - Multi-Consistency -- Oral - Pill -- Oral Phase - Comment --  CHL IP PHARYNGEAL PHASE 11/09/2020 Pharyngeal Phase Impaired Pharyngeal- Pudding Teaspoon -- Pharyngeal -- Pharyngeal- Pudding Cup -- Pharyngeal -- Pharyngeal- Honey Teaspoon -- Pharyngeal -- Pharyngeal- Honey Cup -- Pharyngeal -- Pharyngeal- Nectar Teaspoon Reduced anterior laryngeal mobility Pharyngeal -- Pharyngeal- Nectar Cup Reduced anterior laryngeal mobility Pharyngeal -- Pharyngeal- Nectar Straw Reduced anterior laryngeal mobility Pharyngeal -- Pharyngeal- Thin Teaspoon Reduced anterior laryngeal mobility;Reduced epiglottic inversion;Penetration/Aspiration before swallow Pharyngeal Material enters airway, passes BELOW cords and not ejected out despite cough attempt by patient Pharyngeal- Thin Cup -- Pharyngeal -- Pharyngeal- Thin Straw -- Pharyngeal -- Pharyngeal- Puree Reduced anterior laryngeal mobility;Pharyngeal residue - valleculae Pharyngeal -- Pharyngeal- Mechanical Soft -- Pharyngeal -- Pharyngeal- Regular Reduced anterior laryngeal mobility;Pharyngeal residue - valleculae Pharyngeal -- Pharyngeal- Multi-consistency -- Pharyngeal -- Pharyngeal- Pill -- Pharyngeal -- Pharyngeal Comment --  CHL IP CERVICAL ESOPHAGEAL PHASE 11/09/2020 Cervical Esophageal Phase WFL Pudding Teaspoon -- Pudding Cup -- Honey Teaspoon -- Honey Cup -- Nectar Teaspoon -- Nectar Cup -- Nectar Straw -- Thin Teaspoon -- Thin Cup -- Thin Straw -- Puree -- Mechanical Soft -- Regular -- Multi-consistency -- Pill -- Cervical Esophageal Comment -- Osie Bond., M.A. CCC-SLP Acute Rehabilitation Services Pager [phone] Office [phone] 11/09/2020, 9:17 AM              VAS [place] UPPER EXTREMITY ARTERIAL DUPLEX  Result Date: 11/04/2020  UPPER EXTREMITY DUPLEX STUDY Patient Name:  ISHIA HANANIA  Date of Exam:   11/03/2020 Medical Rec #:  [id number]          Accession #:    [id number] Date of Birth: 12/17/1955          Patient Gender: F Patient Age:   65Y Exam [facility]  [facility] Procedure:      VAS [place] UPPER EXTREMITY ARTERIAL DUPLEX Referring Phys: [id number] MATTHEW EVELAND --------------------------------------------------------------------------------  Indications: Cold, blue hand, unable to doppler radial pulse. History:     Patient has a history of catheterization via left radial artery.  Other Factors: Vasopressors. Limitations: Bandaging, lines Comparison Study: No prior studies. Performing Technologist: Darlin Coco RDMS,RVT  Examination Guidelines: A complete evaluation includes B-mode imaging, spectral Doppler, color Doppler, and power Doppler as needed of all accessible portions of each vessel. Bilateral testing is considered an integral part of a complete examination. Limited examinations for reoccurring indications may be performed as noted.  Left Doppler Findings: +---------------+----------+------------------+--------+-----------------------+ Site           PSV (cm/s)Waveform          StenosisComments                +---------------+----------+------------------+--------+-----------------------+ Subclavian Prox54        audibly triphasic         Bandaging- poor angle                                                      of insonation           +---------------+----------+------------------+--------+-----------------------+ Subclavian Mid  Bandaging               +---------------+----------+------------------+--------+-----------------------+ Subclavian Dist                                    Bandaging               +---------------+----------+------------------+--------+-----------------------+ Axillary       98        triphasic                                         +---------------+----------+------------------+--------+-----------------------+ Brachial  Prox  103       triphasic                                         +---------------+----------+------------------+--------+-----------------------+ Brachial Mid   96        triphasic                                         +---------------+----------+------------------+--------+-----------------------+ Brachial Dist  90        biphasic                  Non-obstructive lesion                                                     identified at the                                                          distal brachial         +---------------+----------+------------------+--------+-----------------------+ Radial Prox    20        monophasic                                        +---------------+----------+------------------+--------+-----------------------+ Radial Mid     23        monophasic                                        +---------------+----------+------------------+--------+-----------------------+ Radial Dist                                occluded                        +---------------+----------+------------------+--------+-----------------------+ Ulnar Prox     85        biphasic                                          +---------------+----------+------------------+--------+-----------------------+  Ulnar Mid      28        monophasic                Calcified appearance    +---------------+----------+------------------+--------+-----------------------+ Ulnar Dist     7         dampened                  Calcified appearance                             monophasic                                        +---------------+----------+------------------+--------+-----------------------+ Occluded radial artery from the mid to distal segment. Ulnar artery patent with calcification through the mid to distal segments. Appearance of a non-obstructive lesion in the distal brachial artery.  Summary:  Left: Obstruction noted in the radial artery.  *See table(s) above for measurements and observations. Electronically signed by Jamelle Haring on 11/04/2020 at 4:29:57 PM.    Final    ECHOCARDIOGRAM COMPLETE  Result Date: 10/29/2020    ECHOCARDIOGRAM REPORT   Patient Name:   KAYLYN MILKEY Date of Exam: 10/29/2020 Medical Rec #:  [id number]         Height:       64.0 in Accession #:    [id number]        Weight:       213.0 lb Date of Birth:  [birth year]         BSA:          2.009 m Patient Age:    13 years          BP:           54/105 mmHg Patient Gender: F                 HR:           101 bpm. Exam Location:  Inpatient Procedure: 2D Echo, Color Doppler and Cardiac Doppler Indications:    R06.9 DOE  History:        Patient has no prior history of Echocardiogram examinations.                 CHF; Risk Factors:Hypertension, Diabetes and Dyslipidemia.  Sonographer:    Raquel Sarna Senior RDCS Referring Phys: [phone] [facility]  Sonographer Comments: Scanned supine on artificial respirator. IMPRESSIONS  1. Left ventricular ejection fraction, by estimation, is 55 to 60%. The left ventricle has normal function. The left ventricle has no regional wall motion abnormalities. There is moderate left ventricular hypertrophy. Left ventricular diastolic parameters are indeterminate.  2. Right ventricular systolic function is normal. The right ventricular size is normal. There is moderately elevated pulmonary artery systolic pressure.  3. A small pericardial effusion is present.  4. The mitral valve is normal in structure. No evidence of mitral valve regurgitation. No evidence of mitral stenosis.  5. The aortic valve is tricuspid. Aortic valve regurgitation is not visualized. No aortic stenosis is present.  6. The inferior vena cava is dilated in size with <50% respiratory variability, suggesting right atrial pressure of 15 mmHg. FINDINGS  Left Ventricle: Left ventricular ejection fraction, by estimation, is 55 to 60%. The left ventricle has normal function. The left ventricle  has no regional wall motion abnormalities. The left ventricular  internal cavity size was normal in size. There is  moderate left ventricular hypertrophy. Left ventricular diastolic parameters are indeterminate. Right Ventricle: The right ventricular size is normal.Right ventricular systolic function is normal. There is moderately elevated pulmonary artery systolic pressure. The tricuspid regurgitant velocity is 3.09 m/s, and with an assumed right atrial pressure of 15 mmHg, the estimated right ventricular systolic pressure is [id number] mmHg. Left Atrium: Left atrial size was normal in size. Right Atrium: Right atrial size was normal in size. Pericardium: A small pericardial effusion is present. Mitral Valve: The mitral valve is normal in structure. No evidence of mitral valve regurgitation. No evidence of mitral valve stenosis. Tricuspid Valve: The tricuspid valve is normal in structure. Tricuspid valve regurgitation is mild . No evidence of tricuspid stenosis. Aortic Valve: The aortic valve is tricuspid. Aortic valve regurgitation is not visualized. No aortic stenosis is present. Pulmonic Valve: The pulmonic valve was normal in structure. Pulmonic valve regurgitation is trivial. No evidence of pulmonic stenosis. Aorta: The aortic root is normal in size and structure. Venous: The inferior vena cava is dilated in size with less than 50% respiratory variability, suggesting right atrial pressure of 15 mmHg. IAS/Shunts: No atrial level shunt detected by color flow Doppler.  LEFT VENTRICLE PLAX 2D LVIDd:         3.30 cm  Diastology LVIDs:         2.10 cm  LV e' medial:    5.22 cm/s LV PW:         1.50 cm  LV E/e' medial:  14.2 LV IVS:        1.30 cm  LV e' lateral:   6.85 cm/s LVOT diam:     1.80 cm  LV E/e' lateral: 10.8 LV SV:         31 LV SV Index:   15 LVOT Area:     2.54 cm  RIGHT VENTRICLE RV S prime:     9.36 cm/s TAPSE (M-mode): 1.1 cm LEFT ATRIUM           Index       RIGHT ATRIUM           Index LA diam:      4.10  cm 2.04 cm/m  RA Area:     13.60 cm LA Vol (A2C): 49.1 ml 24.44 ml/m RA Volume:   37.10 ml  18.46 ml/m LA Vol (A4C): 50.4 ml 25.08 ml/m  AORTIC VALVE LVOT Vmax:   76.65 cm/s LVOT Vmean:  49.000 cm/s LVOT VTI:    0.122 m  AORTA Ao Root diam: 2.80 cm Ao Asc diam:  3.00 cm MITRAL VALVE               TRICUSPID VALVE MV Area (PHT): 3.74 cm    TR Peak grad:   38.2 mmHg MV Decel Time: 203 msec    TR Vmax:        309.00 cm/s MV E velocity: 73.90 cm/s MV A velocity: 46.10 cm/s  SHUNTS MV E/A ratio:  1.60        Systemic VTI:  0.12 m                            Systemic Diam: 1.80 cm Kirk Ruths MD Electronically signed by Kirk Ruths MD Signature Date/Time: 10/29/2020/10:42:54 AM    Final    ECHOCARDIOGRAM LIMITED  Result Date: 11/03/2020    ECHOCARDIOGRAM LIMITED REPORT   Patient Name:  Martyna B Michalski Date of Exam: 11/03/2020 Medical Rec #:  [id number]         Height:       64.0 in Accession #:    [id number]        Weight:       191.1 lb Date of Birth:  Jul 30, 1955         BSA:          1.919 m Patient Age:    57 years          BP:           114/43 mmHg Patient Gender: F                 HR:           80 bpm. Exam Location:  Inpatient Procedure: Limited Color Doppler, Color Doppler and Cardiac Doppler Indications:    acute myocardial infarction  History:        Patient has prior history of Echocardiogram examinations, most                 recent 10/29/2020. End stage renal disease; Risk                 Factors:Hypertension.  Sonographer:    Johny Chess Referring Phys: [id number] [place]  1. Left ventricular ejection fraction, by estimation, is 60 to 65%. The left ventricle has normal function. The left ventricle has no regional wall motion abnormalities. There is moderate left ventricular hypertrophy. Left ventricular diastolic parameters are indeterminate.  2. Right ventricular systolic function is mildly reduced. The right ventricular size is normal. Tricuspid regurgitation signal is  inadequate for assessing PA pressure.  3. A small pericardial effusion is present.  4. The mitral valve is normal in structure. No evidence of mitral valve regurgitation. No evidence of mitral stenosis.  5. The aortic valve is tricuspid. Aortic valve regurgitation is not visualized. Mild aortic valve sclerosis is present, with no evidence of aortic valve stenosis.  6. The inferior vena cava is normal in size with <50% respiratory variability, suggesting right atrial pressure of 8 mmHg. FINDINGS  Left Ventricle: Left ventricular ejection fraction, by estimation, is 60 to 65%. The left ventricle has normal function. The left ventricle has no regional wall motion abnormalities. The left ventricular internal cavity size was small. There is moderate  left ventricular hypertrophy. Left ventricular diastolic parameters are indeterminate. Right Ventricle: The right ventricular size is normal. Right ventricular systolic function is mildly reduced. Tricuspid regurgitation signal is inadequate for assessing PA pressure. Pericardium: A small pericardial effusion is present. Mitral Valve: The mitral valve is normal in structure. No evidence of mitral valve stenosis. Tricuspid Valve: The tricuspid valve is normal in structure. Tricuspid valve regurgitation is trivial. Aortic Valve: The aortic valve is tricuspid. Aortic valve regurgitation is not visualized. Mild aortic valve sclerosis is present, with no evidence of aortic valve stenosis. Pulmonic Valve: The pulmonic valve was not well visualized. Pulmonic valve regurgitation is not visualized. Venous: The inferior vena cava is normal in size with less than 50% respiratory variability, suggesting right atrial pressure of 8 mmHg. LEFT VENTRICLE PLAX 2D LVIDd:         3.70 cm Diastology LVIDs:         2.70 cm LV e' medial:    6.85 cm/s LV PW:         1.30 cm LV E/e' medial:  8.1 LV IVS:  1.20 cm LV e' lateral:   7.72 cm/s                        LV E/e' lateral: 7.2  IVC IVC  diam: 2.00 cm LEFT ATRIUM         Index LA diam:    3.70 cm 1.93 cm/m  AORTIC VALVE LVOT Vmax:   84.00 cm/s LVOT Vmean:  55.300 cm/s LVOT VTI:    0.154 m MITRAL VALVE MV Area (PHT): 2.66 cm    SHUNTS MV Decel Time: 285 msec    Systemic VTI: 0.15 m MV E velocity: 55.70 cm/s MV A velocity: 54.80 cm/s MV E/A ratio:  1.02 Oswaldo Milian MD Electronically signed by Oswaldo Milian MD Signature Date/Time: 11/03/2020/1:53:23 PM    Final     Labs:  CBC: Recent Labs    11/06/20 0421 11/06/20 1643 11/07/20 0455 11/08/20 0334 11/09/20 0410  WBC 18.8*  --  25.9* 27.8* 21.8*  HGB 7.5* 11.6* 10.0* 10.0* 9.3*  HCT 25.3* 34.0* 33.9* 33.4* 30.7*  PLT 342  --  455* 559* 602*    COAGS: Recent Labs    08/10/20 0355 09/14/20 0731 10/27/20 1851 10/28/20 0358 10/30/20 0422 10/31/20 0323 11/01/20 0308  INR 1.2 1.1 1.5* 1.5*  --   --   --   APTT  --  32 31 33 104* 91* 104*    BMP: Recent Labs    12/30/19 0541 12/30/19 0541 01/06/20 1014 02/22/20 1152 03/09/20 1158 04/06/20 1113 06/08/20 0550 11/07/20 1620 11/08/20 0334 11/08/20 1527 11/09/20 0410  NA 134*   < > 138   < > 141 136   < > 137 134* 132* 133*  K 4.0  --  5.0   < > 5.2 4.5   < > 4.0 3.9 4.6 4.2  CL 96*  --  96   < > 96 95*   < > 104 101 97* 98  CO2 26  --  25   < > 20 24   < > '27 24 22 26  '$ GLUCOSE 88   < > 84   < > 56* 81   < > 60* 82 82 208*  BUN 18   < > 18   < > 27 17   < > 17 24* 31* 24*  CALCIUM 8.3*  --  9.2   < > 8.5* 8.9   < > 9.4 9.3 9.5 8.6*  CREATININE 6.13*  --  7.28*   < > 6.14* 5.87*   < > 1.71* 2.66* 3.56* 2.82*  GFRNONAA 7*  --  5*   < > 7* 7*   < > 33* 20* 14* 18*  GFRAA 8*  --  6*  --  8* 8*  --   --   --   --   --    < > = values in this interval not displayed.    LIVER FUNCTION TESTS: Recent Labs    10/28/20 0358 10/30/20 0422 10/31/20 2220 11/01/20 0308 11/01/20 1600 11/02/20 0341 11/02/20 1638 11/03/20 0401 11/03/20 1545 11/07/20 1620 11/08/20 0334 11/08/20 1527 11/09/20 0410   BILITOT 0.5  --   --  0.8  --  0.8  --  0.8  1.1  --   --   --   --   --   AST 37  --   --  109*  --  74*  --  87*  84*  --   --   --   --   --  ALT 23  --   --  142*  --  117*  --  77*  75*  --   --   --   --   --   ALKPHOS 63  --   --  79  --  93  --  85  84  --   --   --   --   --   PROT 6.7  --  6.5 6.5  --  7.7  --  7.4  7.1  --   --   --   --   --   ALBUMIN 2.5*   < >  --  2.2*   < > 2.4*   < > 2.1*  2.1*   < > 2.4* 2.3* 2.5* 2.1*   < > = values in this interval not displayed.    TUMOR MARKERS: No results for input(s): AFPTM, CEA, CA199, CHROMGRNA in the last 8760 hours.  Assessment and Plan: 65 y.o. female with ESRD on hemodialysis s/p right AV graft placement in 2020.  Currently admitted at [facility] due to septic shock which has resolved, patient underwent temporary hemodialysis catheter with critical care and was on CRRT until 11/07/2020. Patient underwent for hemodialysis today, when it was found that right AV graft was not usable per nephrology note.   IR was requested for a fistulogram for further evaluation of malfunctioning right AV graft. Case was reviewed by Dr. Earleen Newport, who recommends duplex right AV graft prior to procedure tomorrow for better evaluation. - Duplex ordered.   Dr. Earleen Newport approved a right AV graft fistulogram with possible angioplasty, thrombectomy/declot, stent placement, and tunneled HD catheter placement.  Patient currently on tube feeding which will need to be stopped at midnight.  Nursing diet instruction order placed. Vital sign afebrile, hypertension 156/39, HR 87, RR 18, O2 100% room air Chart review revealed persistent WBC count ranging from 18.8-27.8 for the past 5 days, discussed with critical care who does not think that the patient has acute infection and okay to proceed with tunneled hemodialysis catheter placement. WBC 21.8, Hgb 9.3, PLT 602 Ancef 2 g for possible tunneled hemodialysis catheter placement ordered.   The procedure is tentatively  scheduled for tomorrow pending IR schedule.  Thank you for this interesting consult.  I greatly enjoyed meeting EUSEBIA FREELON and look forward to participating in their care.  A copy of this report was sent to the requesting provider on this date.  Electronically Signed: Tera Mater, PA-C 11/09/2020, 2:08 PM   I spent a total of 40 Minutes   in face to face in clinical consultation, greater than 50% of which was counseling/coordinating care for right AV graft with possible intervention.

## 2020-11-09 NOTE — Progress Notes (Signed)
ANTICOAGULATION CONSULT NOTE - Follow Up Consult  Pharmacy Consult for heparin Indication: atrial fibrillation   Labs: Recent Labs    11/07/20 0455 11/07/20 1620 11/08/20 0334 11/08/20 1527 11/09/20 0410  HGB 10.0*  --  10.0*  --  9.3*  HCT 33.9*  --  33.4*  --  30.7*  PLT 455*  --  559*  --  602*  HEPARINUNFRC 0.53  --  0.41  --  0.25*  CREATININE 1.29*   < > 2.66* 3.56* 2.82*   < > = values in this interval not displayed.    Assessment: 65yo female subtherapeutic on heparin after two levels at goal; no gtt issues or signs of bleeding per RN.  Goal of Therapy:  Heparin level 0.3-0.7 units/ml   Plan:  Will increase heparin gtt by 1-2 units/kg/hr to 1400 units/hr and check level in 8 hours.    Wynona Neat, PharmD, BCPS  11/09/2020,5:13 AM

## 2020-11-09 NOTE — Plan of Care (Signed)
  Problem: Nutrition: Goal: Adequate nutrition will be maintained Outcome: Progressing   Problem: Elimination: Goal: Will not experience complications related to bowel motility Outcome: Progressing   Problem: Safety: Goal: Ability to remain free from injury will improve Outcome: Progressing   

## 2020-11-09 NOTE — Progress Notes (Signed)
Modified Barium Swallow Progress Note  Patient Details  Name: Christy Zhang MRN: [id number] Date of Birth: May 23, 1955  Today's Date: 11/09/2020  Modified Barium Swallow completed.  Full report located under Chart Review in the Imaging Section.  Brief recommendations include the following:  Clinical Impression  Pt has a mild oropharyngeal dysphagia, with oral phase functional for most consistencies but with inadequate mastication of solids that leaves significant lingual residue. Otherwise, she propels liquid boluses in particularly very quickly to her pharynx. Thin liquids move so quickly that she spills them directly into her airway before the swallow, resulting in sensed aspiration. Although view is somewhat limited by shoulders, this does not appear to clear. Her hyoid excursion and epiglottic inversion are also mildly reduced, although inversion improves with the weight of heavier boluses. Mild-moderate residue remains in the valleculae with heavier consistencies as well though. Recommend starting with Dys 1 (puree) diet and nectar thick liquids.   Swallow Evaluation Recommendations       SLP Diet Recommendations: Dysphagia 1 (Puree) solids;Nectar thick liquid   Liquid Administration via: Cup;Straw   Medication Administration: Crushed with puree   Supervision: Staff to assist with self feeding;Full supervision/cueing for compensatory strategies   Compensations: Minimize environmental distractions;Slow rate;Small sips/bites   Postural Changes: Seated upright at 90 degrees   Oral Care Recommendations: Oral care BID   Other Recommendations: Prohibited food (jello, ice cream, thin soups);Remove water pitcher    Osie Bond., M.A. [place] Pager [phone] Office [phone]  11/09/2020,9:17 AM

## 2020-11-09 NOTE — Plan of Care (Signed)
  Problem: Education: Goal: Knowledge of General Education information will improve Description: Including pain rating scale, medication(s)/side effects and non-pharmacologic comfort measures Outcome: Progressing   Problem: Activity: Goal: Risk for activity intolerance will decrease Outcome: Progressing   Problem: Nutrition: Goal: Adequate nutrition will be maintained Outcome: Progressing   Problem: Coping: Goal: Level of anxiety will decrease Outcome: Progressing   

## 2020-11-09 NOTE — Progress Notes (Signed)
Progress Note  Patient Name: Christy Zhang Date of Encounter: 11/09/2020  [facility] Cardiologist: None   Subjective   Denies any chest pain or SOB.  Vaguely remembers her time in the ICU and notes that she feels a lot better.  Inpatient Medications    Scheduled Meds:  amiodarone  400 mg Oral BID   Followed by   [name] ON 11/13/2020] amiodarone  200 mg Oral Daily   aspirin  81 mg Per Tube Daily   atorvastatin  40 mg Per Tube Daily   chlorhexidine  15 mL Mouth Rinse BID   Chlorhexidine Gluconate Cloth  6 each Topical Q0600   Chlorhexidine Gluconate Cloth  6 each Topical Q0600   clopidogrel  75 mg Per Tube Daily   darbepoetin (ARANESP) injection - DIALYSIS  100 mcg Intravenous Q Tue-HD   feeding supplement (PROSource TF)  45 mL Per Tube TID   insulin aspart  3-9 Units Subcutaneous Q4H   mouth rinse  15 mL Mouth Rinse q12n4p   midodrine  5 mg Per Tube TID WC   multivitamin  1 tablet Oral QHS   polyethylene glycol  17 g Per Tube Daily   sodium chloride flush  10-40 mL Intracatheter Q12H   Continuous Infusions:  sodium chloride Stopped (11/07/20 1143)   sodium chloride     sodium chloride 100 mL (11/09/20 0116)   dextrose Stopped (11/08/20 1221)   feeding supplement (VITAL 1.5 CAL) 1,000 mL (11/08/20 1211)   heparin 1,400 Units/hr (11/09/20 0650)   PRN Meds: sodium chloride, sodium chloride, sodium chloride, acetaminophen (TYLENOL) oral liquid 160 mg/5 mL, albuterol, alteplase, alteplase, bisacodyl, Gerhardt's butt cream, heparin, lidocaine (PF), lidocaine-prilocaine, metoprolol tartrate, pentafluoroprop-tetrafluoroeth, sodium chloride, sodium chloride flush   Vital Signs    Vitals:   11/08/20 2332 11/09/20 0431 11/09/20 0500 11/09/20 0801  BP: (!) 90/30   (!) 98/26  Pulse: 76   83  Resp: 20   (!) 25  Temp:  98.6 F (37 C)    TempSrc:  Oral    SpO2: 100%   100%  Weight:   85 kg   Height:        Intake/Output Summary (Last 24 hours) at 11/09/2020  0843 Last data filed at 11/09/2020 0615 Gross per 24 hour  Intake 1498.4 ml  Output 680 ml  Net 818.4 ml   Last 3 Weights 11/09/2020 11/08/2020 11/07/2020  Weight (lbs) 187 lb 6.3 oz 184 lb 11.9 oz 186 lb 11.7 oz  Weight (kg) 85 kg 83.8 kg 84.7 kg      Telemetry    SR- presently off telemetry - Personally Reviewed  ECG    No new since 11/03/20 - Personally Reviewed  Physical Exam   VS:  BP (!) 98/26 (BP Location: Left Arm)   Pulse 83   Temp 98.6 F (37 C) (Oral)   Resp (!) 25   Ht '5\' 4"'$  (1.626 m)   Wt 85 kg   LMP  (LMP Unknown)   SpO2 100%   BMI 32.17 kg/m  , BMI Body mass index is 32.17 kg/m.  GEN: Obese female in no acute distress HEENT: Normal NECK: No JVD; No carotid bruits LYMPHATICS: No lymphadenopathy CARDIAC:RRR, c/d/I Stump site and left arm (radial) RESPIRATORY:  Clear to auscultation without rales, wheezing or rhonchi  ABDOMEN: Soft, non-tender, non-distended MUSCULOSKELETAL:  No edema; No deformity  SKIN: Warm and dry NEUROLOGIC:  Alert and oriented x 3 PSYCHIATRIC:  Normal affect   Labs  High Sensitivity Troponin:   Recent Labs  Lab 10/28/20 0358 10/28/20 0552 11/03/20 0516 11/03/20 0750 11/04/20 0458  TROPONINIHS 109* 107* 119* 115* 91*      Chemistry Recent Labs  Lab 11/03/20 0401 11/03/20 1545 11/08/20 0334 11/08/20 1527 11/09/20 0410  NA 134*   < > 134* 132* 133*  K 5.0   < > 3.9 4.6 4.2  CL 102   < > 101 97* 98  CO2 23   < > '24 22 26  '$ GLUCOSE 200*   < > 82 82 208*  BUN 22   < > 24* 31* 24*  CREATININE 1.68*   < > 2.66* 3.56* 2.82*  CALCIUM 8.7*   < > 9.3 9.5 8.6*  PROT 7.4  7.1  --   --   --   --   ALBUMIN 2.1*  2.1*   < > 2.3* 2.5* 2.1*  AST 87*  84*  --   --   --   --   ALT 77*  75*  --   --   --   --   ALKPHOS 85  84  --   --   --   --   BILITOT 0.8  1.1  --   --   --   --   GFRNONAA 34*   < > 20* 14* 18*  ANIONGAP 9   < > '9 13 9   '$ < > = values in this interval not displayed.     Hematology Recent Labs  Lab  11/07/20 0455 11/08/20 0334 11/09/20 0410  WBC 25.9* 27.8* 21.8*  RBC 4.16 4.11 3.87  HGB 10.0* 10.0* 9.3*  HCT 33.9* 33.4* 30.7*  MCV 81.5 81.3 79.3*  MCH 24.0* 24.3* 24.0*  MCHC 29.5* 29.9* 30.3  RDW 22.1* 21.9* 21.4*  PLT 455* 559* 602*    BNP Recent Labs  Lab 11/04/20 0401  BNP 230.6*     DDimer No results for input(s): DDIMER in the last 168 hours.   Radiology    DG Abd Portable 1V  Result Date: 11/08/2020 CLINICAL DATA:  Evaluate feeding tube placement EXAM: PORTABLE ABDOMEN - 1 VIEW COMPARISON:  11/04/2020 FINDINGS: Interval placement of feeding tube. The tip is in the projection of the distal stomach/pylorus. Distal portion of left IJ catheter is again noted projecting over the cavoatrial junction. IMPRESSION: Interval placement of feeding tube with tip in the distal stomach/pylorus. Electronically Signed   By: Kerby Moors M.D.   On: 11/08/2020 11:35    Cardiac Studies   Echo 11/03/20:  1. Left ventricular ejection fraction, by estimation, is 60 to 65%. The  left ventricle has normal function. The left ventricle has no regional  wall motion abnormalities. There is moderate left ventricular hypertrophy.  Left ventricular diastolic  parameters are indeterminate.   2. Right ventricular systolic function is mildly reduced. The right  ventricular size is normal. Tricuspid regurgitation signal is inadequate  for assessing PA pressure.   3. A small pericardial effusion is present.   4. The mitral valve is normal in structure. No evidence of mitral valve  regurgitation. No evidence of mitral stenosis.   5. The aortic valve is tricuspid. Aortic valve regurgitation is not  visualized. Mild aortic valve sclerosis is present, with no evidence of  aortic valve stenosis.   6. The inferior vena cava is normal in size with <50% respiratory  variability, suggesting right atrial pressure of 8 mmHg.   Patient Profile     65  y.o. female with ESRD on HD, PAD status post  bilateral BKA, diabetes, hypertension, and obesity admitted with sepsis secondary to pneumonia.  Cardiology consulted for new onset atrial fibrillation with RVR and elevated troponin.  Assessment & Plan    # New onset atrial fibrillation/flutter: CHADSVASC 4 -She is maintaining NSR on exam  -continue Amio '400mg'$  BID and decrease to '200mg'$  daliy starting 7/18 She is on IV heparin.  This should not particularly cause her low diastolic BP. -change IV heparin to Eliquis once she is on cleared by speech.    # NSTEMI: #PAD s/p thrombectomy of L radial in ICU -Likely related to [place] ischemia in the setting of sepsis.   -HS troponin was elevated to 119 and relatively flat.   -2D echo with normal LVEF - presently on DAPT and AC (triple therapy); if vascular in agreement this may be continued for 1 months and then transitioned to [place] and plavix  # Septic shock: Resolving.  Pressors weaned on 7/11.  # Hyperlipidemia: # HTN and DM  Continue atorvastatin.   For questions or updates, please contact [place] Please consult [email or web address] for contact info under        Signed, Werner Lean, MD  11/09/2020, 8:43 AM

## 2020-11-10 DIAGNOSIS — I4819 Other persistent atrial fibrillation: Secondary | ICD-10-CM | POA: Diagnosis not present

## 2020-11-10 DIAGNOSIS — M79642 Pain in left hand: Secondary | ICD-10-CM

## 2020-11-10 DIAGNOSIS — Z515 Encounter for palliative care: Secondary | ICD-10-CM | POA: Diagnosis not present

## 2020-11-10 DIAGNOSIS — J189 Pneumonia, unspecified organism: Secondary | ICD-10-CM | POA: Diagnosis not present

## 2020-11-10 DIAGNOSIS — E8809 Other disorders of plasma-protein metabolism, not elsewhere classified: Secondary | ICD-10-CM | POA: Diagnosis not present

## 2020-11-10 LAB — RENAL FUNCTION PANEL
Albumin: 2.1 g/dL — ABNORMAL LOW (ref 3.5–5.0)
Albumin: 1.8 g/dL — ABNORMAL LOW (ref 3.5–5.0)
Anion gap: 15 (ref 5–15)
Anion gap: 10 (ref 5–15)
BUN: 51 mg/dL — ABNORMAL HIGH (ref 8–23)
BUN: 31 mg/dL — ABNORMAL HIGH (ref 8–23)
CO2: 19 mmol/L — ABNORMAL LOW (ref 22–32)
CO2: 26 mmol/L (ref 22–32)
Calcium: 9.1 mg/dL (ref 8.9–10.3)
Calcium: 8.5 mg/dL — ABNORMAL LOW (ref 8.9–10.3)
Chloride: 100 mmol/L (ref 98–111)
Chloride: 99 mmol/L (ref 98–111)
Creatinine, Ser: 4.52 mg/dL — ABNORMAL HIGH (ref 0.44–1.00)
Creatinine, Ser: 3.4 mg/dL — ABNORMAL HIGH (ref 0.44–1.00)
GFR, Estimated: 10 mL/min — ABNORMAL LOW (ref >60)
GFR, Estimated: 15 mL/min — ABNORMAL LOW (ref >60)
Glucose, Bld: 72 mg/dL (ref 70–99)
Glucose, Bld: 185 mg/dL — ABNORMAL HIGH (ref 70–99)
Phosphorus: 4.2 mg/dL (ref 2.5–4.6)
Phosphorus: 3.3 mg/dL (ref 2.5–4.6)
Potassium: 5.3 mmol/L — ABNORMAL HIGH (ref 3.5–5.1)
Potassium: 4.3 mmol/L (ref 3.5–5.1)
Sodium: 134 mmol/L — ABNORMAL LOW (ref 135–145)
Sodium: 135 mmol/L (ref 135–145)

## 2020-11-10 LAB — CBC
HCT: 31 % — ABNORMAL LOW (ref 36.0–46.0)
Hemoglobin: 9.6 g/dL — ABNORMAL LOW (ref 12.0–15.0)
MCH: 24.3 pg — ABNORMAL LOW (ref 26.0–34.0)
MCHC: 31 g/dL (ref 30.0–36.0)
MCV: 78.5 fL — ABNORMAL LOW (ref 80.0–100.0)
Platelets: 705 10*3/uL — ABNORMAL HIGH (ref 150–400)
RBC: 3.95 MIL/uL (ref 3.87–5.11)
RDW: 21.7 % — ABNORMAL HIGH (ref 11.5–15.5)
WBC: 21.8 10*3/uL — ABNORMAL HIGH (ref 4.0–10.5)
nRBC: 0 % (ref 0.0–0.2)

## 2020-11-10 LAB — GLUCOSE, CAPILLARY
Glucose-Capillary: 117 mg/dL — ABNORMAL HIGH (ref 70–99)
Glucose-Capillary: 79 mg/dL (ref 70–99)
Glucose-Capillary: 113 mg/dL — ABNORMAL HIGH (ref 70–99)
Glucose-Capillary: 141 mg/dL — ABNORMAL HIGH (ref 70–99)
Glucose-Capillary: 171 mg/dL — ABNORMAL HIGH (ref 70–99)

## 2020-11-10 LAB — MAGNESIUM: Magnesium: 2.8 mg/dL — ABNORMAL HIGH (ref 1.7–2.4)

## 2020-11-10 LAB — HEPARIN LEVEL (UNFRACTIONATED): Heparin Unfractionated: 0.36 IU/mL (ref 0.30–0.70)

## 2020-11-10 MED ORDER — PROSOURCE TF PO LIQD
45.0000 mL | Freq: Four times a day (QID) | ORAL | Status: DC
  Administered 2020-11-10 – 2020-11-12 (×9): 45 mL
  Filled 2020-11-10 (×10): qty 45

## 2020-11-10 MED ORDER — NEPRO/CARBSTEADY PO LIQD
1000.0000 mL | ORAL | Status: DC
  Administered 2020-11-10 – 2020-11-12 (×3): 1000 mL
  Filled 2020-11-10 (×4): qty 1000

## 2020-11-10 MED ORDER — DARBEPOETIN ALFA 100 MCG/0.5ML IJ SOSY
100.0000 ug | PREFILLED_SYRINGE | INTRAMUSCULAR | Status: DC
  Administered 2020-11-10: 100 ug via INTRAVENOUS
  Filled 2020-11-10: qty 0.5

## 2020-11-10 MED ORDER — SODIUM CHLORIDE 0.9 % IV BOLUS
500.0000 mL | INTRAVENOUS | Status: AC
  Administered 2020-11-10: 500 mL via INTRAVENOUS

## 2020-11-10 NOTE — Progress Notes (Signed)
IR was requested for right AV graft fistulogram with possible intervention.   Case was reviewed by Dr. Earleen Newport who recommended US Duplex of the graft.  The Duplex showed no thrombosis or stenosis.  Dr. Earleen Newport discussed with Dr. Johnney Ou, the procedure is on hold.  HD team to attempt to use the graft.     Will delete the fistulogram order.  Please call IR for questions and concerns.    Armando Gang Brynlea Spindler PA-C 11/10/2020 9:47 AM

## 2020-11-10 NOTE — Progress Notes (Signed)
Pt had persistently blood pressures, and could not complete treatment based on ordered parameters. Net UF removed was about 375 mL, but a 500 mL bolus was given after treatment to resuscitate pressures. Her pressures improved, no symptoms through entire hypotensive episode, and no crackles on auscultation or other signs of intolerance to fluid bolus.   Report and updates given to floor RN. Nephrologist kept informed as well. Pt returning to floor stable, asymptomatic, on bedside monitor, room air, and w/heparin gtt running at prior rate

## 2020-11-10 NOTE — Progress Notes (Signed)
Calorie count envelope placed on door.

## 2020-11-10 NOTE — Progress Notes (Signed)
Cardiology Progress Note  Patient ID: Christy Zhang MRN: [id number] DOB: 1956-01-05 Date of Encounter: 11/10/2020  Primary Cardiologist: None  Subjective   Chief Complaint: Weakness/fatigue  HPI: Maintaining sinus rhythm.  Plans for AV fistulogram.  Reports she is weak and fatigued.  No chest pain or significant trouble breathing.  ROS:  All other ROS reviewed and negative. Pertinent positives noted in the HPI.     Inpatient Medications  Scheduled Meds:  amiodarone  400 mg Oral BID   Followed by   [name] ON 11/13/2020] amiodarone  200 mg Oral Daily   aspirin  81 mg Per Tube Daily   atorvastatin  40 mg Per Tube Daily   chlorhexidine  15 mL Mouth Rinse BID   Chlorhexidine Gluconate Cloth  6 each Topical Q0600   Chlorhexidine Gluconate Cloth  6 each Topical Q0600   clopidogrel  75 mg Per Tube Daily   darbepoetin (ARANESP) injection - DIALYSIS  100 mcg Intravenous Q Fri-HD   feeding supplement (PROSource TF)  45 mL Per Tube TID   insulin aspart  3-9 Units Subcutaneous Q4H   mouth rinse  15 mL Mouth Rinse q12n4p   midodrine  5 mg Per Tube TID WC   multivitamin  1 tablet Oral QHS   nystatin   Topical BID   polyethylene glycol  17 g Per Tube Daily   sodium chloride flush  10-40 mL Intracatheter Q12H   Continuous Infusions:  sodium chloride Stopped (11/07/20 1143)   sodium chloride     sodium chloride Stopped (11/09/20 1229)    ceFAZolin (ANCEF) IV     feeding supplement (VITAL 1.5 CAL) 1,000 mL (11/08/20 1211)   heparin 1,450 Units/hr (11/10/20 0921)   PRN Meds: sodium chloride, sodium chloride, sodium chloride, acetaminophen (TYLENOL) oral liquid 160 mg/5 mL, albuterol, alteplase, alteplase, bisacodyl, Gerhardt's butt cream, heparin, lidocaine (PF), lidocaine-prilocaine, metoprolol tartrate, pentafluoroprop-tetrafluoroeth, sodium chloride, sodium chloride flush   Vital Signs   Vitals:   11/09/20 1204 11/09/20 2005 11/10/20 0439 11/10/20 0500  BP: (!) 156/39 (!) 123/36  (!) 125/31   Pulse: 87     Resp: 18     Temp: 98.3 F (36.8 C) 98.2 F (36.8 C) 98.3 F (36.8 C)   TempSrc: Oral Oral Oral   SpO2: 100%     Weight:    87.8 kg  Height:        Intake/Output Summary (Last 24 hours) at 11/10/2020 1114 Last data filed at 11/09/2020 1700 Gross per 24 hour  Intake 793.71 ml  Output --  Net 793.71 ml   Last 3 Weights 11/10/2020 11/09/2020 11/08/2020  Weight (lbs) 193 lb 9 oz 187 lb 6.3 oz 184 lb 11.9 oz  Weight (kg) 87.8 kg 85 kg 83.8 kg      Telemetry  Overnight telemetry shows sinus rhythm in the 90s, which I personally reviewed.    Physical Exam   Vitals:   11/09/20 1204 11/09/20 2005 11/10/20 0439 11/10/20 0500  BP: (!) 156/39 (!) 123/36 (!) 125/31   Pulse: 87     Resp: 18     Temp: 98.3 F (36.8 C) 98.2 F (36.8 C) 98.3 F (36.8 C)   TempSrc: Oral Oral Oral   SpO2: 100%     Weight:    87.8 kg  Height:        Intake/Output Summary (Last 24 hours) at 11/10/2020 1114 Last data filed at 11/09/2020 1700 Gross per 24 hour  Intake 793.71 ml  Output --  Net  793.71 ml    Last 3 Weights 11/10/2020 11/09/2020 11/08/2020  Weight (lbs) 193 lb 9 oz 187 lb 6.3 oz 184 lb 11.9 oz  Weight (kg) 87.8 kg 85 kg 83.8 kg    Body mass index is 33.23 kg/m.  General: Well nourished, well developed, in no acute distress Head: Atraumatic, normal size  Eyes: PEERLA, EOMI  Neck: Supple, no JVD Endocrine: No thryomegaly Cardiac: Normal S1, S2; RRR; no murmurs, rubs, or gallops Lungs: diminished breath sounds bilaterally Abd: Soft, nontender, no hepatomegaly  Ext: Status post bilateral BKA Musculoskeletal: No deformities, BUE and BLE strength normal and equal Skin: Warm and dry, no rashes   Neuro: Alert and oriented to person, place, time, and situation, CNII-XII grossly intact, no focal deficits  Psych: Normal mood and affect   Labs  High Sensitivity Troponin:   Recent Labs  Lab 10/28/20 0358 10/28/20 0552 11/03/20 0516 11/03/20 0750 11/04/20 0458   TROPONINIHS 109* 107* 119* 115* 91*     Cardiac EnzymesNo results for input(s): TROPONINI in the last 168 hours. No results for input(s): TROPIPOC in the last 168 hours.  Chemistry Recent Labs  Lab 11/09/20 0410 11/09/20 2000 11/10/20 0522  NA 133* 135 134*  K 4.2 4.6 5.3*  CL 98 98 100  CO2 26 23 19*  GLUCOSE 208* 136* 72  BUN 24* 41* 51*  CREATININE 2.82* 3.86* 4.52*  CALCIUM 8.6* 8.7* 9.1  ALBUMIN 2.1* 2.0* 2.1*  GFRNONAA 18* 13* 10*  ANIONGAP '9 14 15    '$ Hematology Recent Labs  Lab 11/08/20 0334 11/09/20 0410 11/10/20 0522  WBC 27.8* 21.8* 21.8*  RBC 4.11 3.87 3.95  HGB 10.0* 9.3* 9.6*  HCT 33.4* 30.7* 31.0*  MCV 81.3 79.3* 78.5*  MCH 24.3* 24.0* 24.3*  MCHC 29.9* 30.3 31.0  RDW 21.9* 21.4* 21.7*  PLT 559* 602* 705*   BNP Recent Labs  Lab 11/04/20 0401  BNP 230.6*    DDimer No results for input(s): DDIMER in the last 168 hours.   Radiology  DG Swallowing Func-Speech Pathology  Result Date: 11/09/2020 Formatting of this result is different from the original. Objective Swallowing Evaluation: Type of Study: MBS-Modified Barium Swallow Study  Patient Details Name: TIFFANYMARIE Zhang MRN: [id number] Date of Birth: November 15, 1955 Today's Date: 11/09/2020 Time: SLP Start Time (ACUTE ONLY): [id number] -SLP Stop Time (ACUTE ONLY): 0840 SLP Time Calculation (min) (ACUTE ONLY): 12 min Past Medical History: Past Medical History: Diagnosis Date  Anemia   Arthritis   Asthma   in the past  CHF (congestive heart failure) ([place])   Diabetes mellitus with complication ([place])   Type 2  End stage renal disease on dialysis ([place])   Gangrene ([place])   Hyperlipidemia   Hypertension  Past Surgical History: Past Surgical History: Procedure Laterality Date   LEFT BELOW KNEE AMPUTATION (Left Knee)  06/08/2020  A/V FISTULAGRAM Right 02/04/2017  Procedure: A/V Fistulagram;  Surgeon: Waynetta Sandy, MD;  Location: [facility];  Service: Cardiovascular;  Laterality: Right;  ABDOMINAL AORTOGRAM  W/LOWER EXTREMITY N/A 11/18/2019  Procedure: ABDOMINAL AORTOGRAM W/LOWER EXTREMITY;  Surgeon: [name], MD;  Location: [facility];  Service: Cardiovascular;  Laterality: N/A;  ABDOMINAL AORTOGRAM W/LOWER EXTREMITY N/A 02/23/2020  Procedure: ABDOMINAL AORTOGRAM W/LOWER EXTREMITY;  Surgeon: [name], MD;  Location: [facility];  Service: Cardiovascular;  Laterality: N/A;  Bilateral  ABDOMINAL AORTOGRAM W/LOWER EXTREMITY N/A 08/10/2020  Procedure: ABDOMINAL AORTOGRAM W/LOWER EXTREMITY;  Surgeon: [name], MD;  Location: [facility];  Service: Cardiovascular;  Laterality: N/A;  AMPUTATION Left 06/08/2020  Procedure: LEFT BELOW KNEE AMPUTATION;  Surgeon: Cherre Robins, MD;  Location: [facility];  Service: Vascular;  Laterality: Left;  AMPUTATION Right 09/14/2020  Procedure: RIGHT BELOW KNEE AMPUTATION;  Surgeon: Waynetta Sandy, MD;  Location: [facility];  Service: Vascular;  Laterality: Right;  AV FISTULA PLACEMENT Right 08/25/2014  Procedure: RIGHT BRACHIOCEPHALIC ARTERIOVENOUS (AV) FISTULA CREATION;  Surgeon: Mal Misty, MD;  Location: [facility];  Service: Vascular;  Laterality: Right;  AV FISTULA PLACEMENT Right 03/18/2019  Procedure: INSERTION OF ARTERIOVENOUS (AV) GORE-TEX GRAFT RIGHT ARM;  Surgeon: Serafina Mitchell, MD;  Location: [facility];  Service: Vascular;  Laterality: Right;  [place] Right 12/24/2018  Procedure: FIRST STAGE BASILIC VEIN TRANSPOSITION RIGHT UPPER ARM;  Surgeon: Serafina Mitchell, MD;  Location: [facility];  Service: Vascular;  Laterality: Right;  FISTULA SUPERFICIALIZATION Right 11/03/2014  Procedure: RIGHT UPPER ARM FISTULA SUPERFICIALIZATION;  Surgeon: Mal Misty, MD;  Location: [facility];  Service: Vascular;  Laterality: Right;  FISTULOGRAM Right 01/16/2016  Procedure: RIGHT UPPER EXTREMITY ARTERIOVENOUS FISTULOGRAM WITH BALLOON ANGIOPLASTY;  Surgeon: Vickie Epley, MD;  Location: [facility];  Service: Vascular;  Laterality:  Right;  INSERTION OF DIALYSIS CATHETER    X4  IR FLUORO GUIDE CV LINE RIGHT  12/03/2018  IR REMOVAL TUN CV CATH W/O FL  07/06/2019  IR THROMBECTOMY AV FISTULA W/THROMBOLYSIS/PTA INC/SHUNT/IMG RIGHT Right 12/03/2018  IR THROMBECTOMY AV FISTULA W/THROMBOLYSIS/PTA/STENT INC/SHUNT/IMG RT Right 10/11/2019  IR US GUIDE VASC ACCESS RIGHT  12/03/2018  IR US GUIDE VASC ACCESS RIGHT  12/03/2018  IR US GUIDE VASC ACCESS RIGHT  10/14/2019  LIGATION OF COMPETING BRANCHES OF ARTERIOVENOUS FISTULA Right 11/03/2014  Procedure: LIGATION OF COMPETING BRANCHE x1 OF RIGHT UPPER ARM ARTERIOVENOUS FISTULA;  Surgeon: Mal Misty, MD;  Location: [facility];  Service: Vascular;  Laterality: Right;  PERIPHERAL VASCULAR BALLOON ANGIOPLASTY Right 02/04/2017  Procedure: PERIPHERAL VASCULAR BALLOON ANGIOPLASTY;  Surgeon: Waynetta Sandy, MD;  Location: [facility];  Service: Cardiovascular;  Laterality: Right;  PERIPHERAL VASCULAR BALLOON ANGIOPLASTY Right 11/18/2019  Procedure: PERIPHERAL VASCULAR BALLOON ANGIOPLASTY;  Surgeon: [name], MD;  Location: [facility];  Service: Cardiovascular;  Laterality: Right;  anterior tibial  PERIPHERAL VASCULAR BALLOON ANGIOPLASTY  02/23/2020  Procedure: PERIPHERAL VASCULAR BALLOON ANGIOPLASTY;  Surgeon: [name], MD;  Location: [facility];  Service: Cardiovascular;;  Lt  AT  PERIPHERAL VASCULAR INTERVENTION Right 08/10/2020  Procedure: PERIPHERAL VASCULAR INTERVENTION;  Surgeon: [name], MD;  Location: [facility];  Service: Cardiovascular;  Laterality: Right;  THROMBECTOMY BRACHIAL ARTERY Left 11/04/2020  Procedure: THROMBECTOMY RADIAL ARTERY with Bovine Patching.;  Surgeon: Cherre Robins, MD;  Location: [facility];  Service: Vascular;  Laterality: Left;  TRANSMETATARSAL AMPUTATION Left 02/24/2020  Procedure: TRANSMETATARSAL AMPUTATION LEFT;  Surgeon: Cherre Robins, MD;  Location: [facility];  Service: Vascular;  Laterality: Left;  TRANSMETATARSAL  AMPUTATION Right 08/11/2020  Procedure: RIGHT TRANSMETATARSAL AMPUTATION;  Surgeon: Waynetta Sandy, MD;  Location: [facility];  Service: Vascular;  Laterality: Right;  TUBAL LIGATION   HPI: Pt is a 65 yo female presented 7/1 with septic shock from PNA, requiring intubation (7/3-7/11) and CRRT (started 7/3). Brief PEA 7/3. PMH includes: CHF, CKD, T2DM, ESRD, HLD, HTN  Subjective: alert, cooperative, a little confused Assessment / Plan / Recommendation CHL IP CLINICAL IMPRESSIONS 11/09/2020 Clinical Impression Pt has a mild oropharyngeal  dysphagia, with oral phase functional for most consistencies but with inadequate mastication of solids that leaves significant lingual residue. Otherwise, she propels liquid boluses in particularly very quickly to her pharynx. Thin liquids move so quickly that she spills them directly into her airway before the swallow, resulting in sensed aspiration. Although view is somewhat limited by shoulders, this does not appear to clear. Her hyoid excursion and epiglottic inversion are also mildly reduced, although inversion improves with the weight of heavier boluses. Mild-moderate residue remains in the valleculae with heavier consistencies as well though. Recommend starting with Dys 1 (puree) diet and nectar thick liquids. SLP Visit Diagnosis Dysphagia, oropharyngeal phase (R13.12) Attention and concentration deficit following -- Frontal lobe and executive function deficit following -- Impact on safety and function Mild aspiration risk   CHL IP TREATMENT RECOMMENDATION 11/09/2020 Treatment Recommendations Therapy as outlined in treatment plan below   Prognosis 11/09/2020 Prognosis for Safe Diet Advancement Good Barriers to Reach Goals Cognitive deficits Barriers/Prognosis Comment -- CHL IP DIET RECOMMENDATION 11/09/2020 SLP Diet Recommendations Dysphagia 1 (Puree) solids;Nectar thick liquid Liquid Administration via Cup;Straw Medication Administration Crushed with puree Compensations  Minimize environmental distractions;Slow rate;Small sips/bites Postural Changes Seated upright at 90 degrees   CHL IP OTHER RECOMMENDATIONS 11/09/2020 Recommended Consults -- Oral Care Recommendations Oral care BID Other Recommendations Prohibited food (jello, ice cream, thin soups);Remove water pitcher   CHL IP FOLLOW UP RECOMMENDATIONS 11/09/2020 Follow up Recommendations Home health SLP   CHL IP FREQUENCY AND DURATION 11/09/2020 Speech Therapy Frequency (ACUTE ONLY) min 2x/week Treatment Duration 2 weeks      CHL IP ORAL PHASE 11/09/2020 Oral Phase Impaired Oral - Pudding Teaspoon -- Oral - Pudding Cup -- Oral - Honey Teaspoon -- Oral - Honey Cup -- Oral - Nectar Teaspoon WFL Oral - Nectar Cup WFL Oral - Nectar Straw WFL Oral - Thin Teaspoon WFL Oral - Thin Cup -- Oral - Thin Straw -- Oral - Puree WFL Oral - Mech Soft -- Oral - Regular Impaired mastication;Lingual/palatal residue Oral - Multi-Consistency -- Oral - Pill -- Oral Phase - Comment --  CHL IP PHARYNGEAL PHASE 11/09/2020 Pharyngeal Phase Impaired Pharyngeal- Pudding Teaspoon -- Pharyngeal -- Pharyngeal- Pudding Cup -- Pharyngeal -- Pharyngeal- Honey Teaspoon -- Pharyngeal -- Pharyngeal- Honey Cup -- Pharyngeal -- Pharyngeal- Nectar Teaspoon Reduced anterior laryngeal mobility Pharyngeal -- Pharyngeal- Nectar Cup Reduced anterior laryngeal mobility Pharyngeal -- Pharyngeal- Nectar Straw Reduced anterior laryngeal mobility Pharyngeal -- Pharyngeal- Thin Teaspoon Reduced anterior laryngeal mobility;Reduced epiglottic inversion;Penetration/Aspiration before swallow Pharyngeal Material enters airway, passes BELOW cords and not ejected out despite cough attempt by patient Pharyngeal- Thin Cup -- Pharyngeal -- Pharyngeal- Thin Straw -- Pharyngeal -- Pharyngeal- Puree Reduced anterior laryngeal mobility;Pharyngeal residue - valleculae Pharyngeal -- Pharyngeal- Mechanical Soft -- Pharyngeal -- Pharyngeal- Regular Reduced anterior laryngeal mobility;Pharyngeal residue  - valleculae Pharyngeal -- Pharyngeal- Multi-consistency -- Pharyngeal -- Pharyngeal- Pill -- Pharyngeal -- Pharyngeal Comment --  CHL IP CERVICAL ESOPHAGEAL PHASE 11/09/2020 Cervical Esophageal Phase WFL Pudding Teaspoon -- Pudding Cup -- Honey Teaspoon -- Honey Cup -- Nectar Teaspoon -- Nectar Cup -- Nectar Straw -- Thin Teaspoon -- Thin Cup -- Thin Straw -- Puree -- Mechanical Soft -- Regular -- Multi-consistency -- Pill -- Cervical Esophageal Comment -- Osie Bond., M.A. CCC-SLP Acute Rehabilitation Services Pager [phone] Office [phone] 11/09/2020, 9:17 AM              VAS US DUPLEX DIALYSIS ACCESS (AVF, AVG)  Result Date: 11/09/2020 DIALYSIS ACCESS Patient Name:  Millenia B  Carpenter  Date of Exam:   11/09/2020 Medical Rec #: [id number]          Accession #:    [id number] Date of Birth: 1955-05-12          Patient Gender: F Patient Age:   65Y Exam Location:  [facility] Procedure:      VAS US DUPLEX DIALYSIS ACCESS (AVF, AVG) Referring Phys: [id number] Armando Gang HAN --------------------------------------------------------------------------------  Reason for Exam: Malfunctioning right AV Graft, prefistulagram evaluation. Access Site: Right Upper Extremity. Access Type: Upper arm loop AVG. Limitations: body habitus and tissue properties Comparison Study: no prior Performing Technologist: Archie Patten RVS  Examination Guidelines: A complete evaluation includes B-mode imaging, spectral Doppler, color Doppler, and power Doppler as needed of all accessible portions of each vessel. Unilateral testing is considered an integral part of a complete examination. Limited examinations for reoccurring indications may be performed as noted.  Findings:   +--------------------+----------+-----------------+--------+ AVG                 PSV (cm/s)Flow Vol (mL/min)Describe +--------------------+----------+-----------------+--------+ Native artery inflow   199            9                  +--------------------+----------+-----------------+--------+ Arterial anastomosis   429                              +--------------------+----------+-----------------+--------+ Prox graft             181                              +--------------------+----------+-----------------+--------+ Mid graft              155                              +--------------------+----------+-----------------+--------+ Distal graft           425                              +--------------------+----------+-----------------+--------+ Venous anastomosis     155                              +--------------------+----------+-----------------+--------+ Venous outflow         196                              +--------------------+----------+-----------------+--------+  Summary: No obvious evidence of thrombus/stenosis or perigraft fluid.  *See table(s) above for measurements and observations.  Diagnosing physician: Jamelle Haring Electronically signed by Jamelle Haring on 11/09/2020 at 5:15:00 PM.    --------------------------------------------------------------------------------   Final     Cardiac Studies  TTE 11/03/2020  1. Left ventricular ejection fraction, by estimation, is 60 to 65%. The  left ventricle has normal function. The left ventricle has no regional  wall motion abnormalities. There is moderate left ventricular hypertrophy.  Left ventricular diastolic  parameters are indeterminate.   2. Right ventricular systolic function is mildly reduced. The right  ventricular size is normal. Tricuspid regurgitation signal is inadequate  for assessing PA pressure.   3. A small pericardial effusion is present.   4. The mitral valve  is normal in structure. No evidence of mitral valve  regurgitation. No evidence of mitral stenosis.   5. The aortic valve is tricuspid. Aortic valve regurgitation is not  visualized. Mild aortic valve sclerosis is present, with no evidence of  aortic valve  stenosis.   6. The inferior vena cava is normal in size with <50% respiratory  variability, suggesting right atrial pressure of 8 mmHg.   Patient Profile  BRINKLEE CRUSER is a 65 y.o. female with ESRD on hemodialysis, PAD status post bilateral BKA, diabetes, hypertension, obesity who was admitted on 10/27/2020 for sepsis and pneumonia.  Cardiology was consulted for new onset atrial fibrillation with RVR as well as elevated troponin.  Course has been complicated by septic shock with prolonged course of pressors.  She also suffered a left radial artery thromboembolic event on [id number] requiring thrombectomy.  Assessment & Plan   New onset atrial fibrillation with RVR -Secondary to critical illness.  Maintaining sinus rhythm.  Out of ICU. -Continue with 1 month of amiodarone therapy as delineated in prior notes. -On heparin drip.  Transition to [place] once you are able and procedures are not needed.  2.  Elevated troponin/demand ischemia -Minimally elevated troponins.  EF is normal.  No wall motion normality.  This is secondary to critical illness. -Not a great candidate for aggressive cardiovascular care.  This is secondary troponin elevation nonetheless.  [facility] will sign off.   Medication Recommendations: As above Other recommendations (labs, testing, etc): None Follow up as an outpatient: Please notify us when she is closer to discharge and we will arrange a cardiology follow-up appointment.  For questions or updates, please contact [place] Please consult [email or web address] for contact info under   Time Spent with Patient: I have spent a total of 25 minutes with patient reviewing hospital notes, telemetry, EKGs, labs and examining the patient as well as establishing an assessment and plan that was discussed with the patient.  > 50% of time was spent in direct patient care.    Signed, Addison Naegeli. Audie Box, MD, [place]  11/10/2020 11:14 AM

## 2020-11-10 NOTE — Progress Notes (Signed)
Occupational Therapy Treatment Patient Details Name: Christy Zhang MRN: [id number] DOB: 01/29/1956 Today's Date: 11/10/2020    History of present illness 65 y.o. female presenting to ED on 10/27/2020 due to AMS. CXR suggestive of acute consolidation or pneumonia with volume loss in the lingula. ETT 7/3-7/12.   PMH: bilateral BKA, ESRD on HD (MWF), T2DM, AOCD, hypertension, thrombocytosis, peripheral vascular disease and hyperlipidemia.   OT comments  Pt slowly progressing towards acute OT goals. Pt able to roll to each side with max +2 assist; able to reach for rail to assist but unable to maintain. Total assist for pericare. 2 grooming tasks at bed level. Provided hand held squeeze ball to work on grip strength. Pt repositioned into partial side-lying position with pillows placed for support at end of session. D/c plan remains appropriate.    Follow Up Recommendations  Home health OT;Supervision/Assistance - 24 hour    Equipment Recommendations  None recommended by OT    Recommendations for Other Services      Precautions / Restrictions Precautions Precautions: Fall Precaution Comments: bil amputee Restrictions Weight Bearing Restrictions: Yes LLE Weight Bearing: Non weight bearing       Mobility Bed Mobility Overal bed mobility: Needs Assistance Bed Mobility: Rolling Rolling: Max assist;+2 for physical assistance         General bed mobility comments: With cueing, pt able to reach across to hold bed rail, difficulty maintaining.    Transfers                 General transfer comment: Pt uses hoyer lift for transfers PTA and since second LE amputation per daugther's reports.    Balance                                           ADL either performed or assessed with clinical judgement   ADL Overall ADL's : Needs assistance/impaired     Grooming: Oral care;Wash/dry hands;Set up;Supervision/safety;Minimal assistance;Bed level Grooming  Details (indicate cue type and reason): cues for initiation. Oral care and washed hands with setup provided. max assist to clean face.                     Toileting- Clothing Manipulation and Hygiene: Maximal assistance;Total assistance;+2 for physical assistance;Bed level Toileting - Clothing Manipulation Details (indicate cue type and reason): max +2 assist to roll to each side and to maintain sidelying position. With cueing pt able to reach for side rail but difficulty maintaining. Total A for pericare       General ADL Comments: Pericare at bed level as detailed above. Pt then completed 2 grooming tasks at bed level.     Vision       Perception     Praxis      Cognition Arousal/Alertness: Awake/alert Behavior During Therapy: Flat affect Overall Cognitive Status: Impaired/Different from baseline Area of Impairment: Problem solving;Following commands;Orientation;Attention;Memory;Safety/judgement;Awareness                 Orientation Level: Situation;Time Current Attention Level: Focused Memory: Decreased recall of precautions;Decreased short-term memory Following Commands: Follows one step commands inconsistently;Follows one step commands with increased time Safety/Judgement: Decreased awareness of deficits;Decreased awareness of safety Awareness: Intellectual Problem Solving: Slow processing;Decreased initiation;Requires tactile cues;Difficulty sequencing;Requires verbal cues General Comments: able to engage in some basic conversation. Slow/delayed responses, soft volume. Pt asking what today's date is  Exercises Exercises: Other exercises Other Exercises Other Exercises: Provided hand held squeeze ball to work on grip strength   Shoulder Instructions       General Comments      Pertinent Vitals/ Pain       Pain Assessment: Faces Faces Pain Scale: Hurts whole lot Pain Location: sacrum and groin during pericare Pain Descriptors / Indicators:  Grimacing;Moaning Pain Intervention(s): Monitored during session;Repositioned;Limited activity within patient's tolerance  Home Living                                          Prior Functioning/Environment              Frequency  Min 2X/week        Progress Toward Goals  OT Goals(current goals can now be found in the care plan section)  Progress towards OT goals: Progressing toward goals  Acute Rehab OT Goals Patient Stated Goal: none stated OT Goal Formulation: Patient unable to participate in goal setting Time For Goal Achievement: 11/22/20 Potential to Achieve Goals: Good ADL Goals Pt Will Perform Eating: with supervision;sitting Pt Will Perform Grooming: with set-up;sitting Pt Will Perform Upper Body Bathing: with min assist;sitting;bed level Pt/caregiver will Perform Home Exercise Program: Increased strength;Both right and left upper extremity Additional ADL Goal #1: Pt will complete rolling in bed with min assist as precursor to ADLs and to decrease burden of care at dc. Additional ADL Goal #2: Pt will follow 1 step commands with 90% accuracy to optimize participation in ADLs.  Plan Discharge plan remains appropriate    Co-evaluation                 AM-PAC OT "6 Clicks" Daily Activity     Outcome Measure   Help from another person eating meals?: Total Help from another person taking care of personal grooming?: A Lot Help from another person toileting, which includes using toliet, bedpan, or urinal?: Total Help from another person bathing (including washing, rinsing, drying)?: A Lot Help from another person to put on and taking off regular upper body clothing?: A Lot Help from another person to put on and taking off regular lower body clothing?: Total 6 Click Score: 9    End of Session    OT Visit Diagnosis: Other abnormalities of gait and mobility (R26.89);Muscle weakness (generalized) (M62.81);Other symptoms and signs involving  cognitive function   Activity Tolerance Patient limited by fatigue;Patient tolerated treatment well   Patient Left in bed;with call bell/phone within reach;with bed alarm set   Nurse Communication          Time: [id number] OT Time Calculation (min): 19 min  Charges: OT General Charges $OT Visit: 1 Visit OT Treatments $Self Care/Home Management : 8-22 mins  Tyrone Schimke, OT Acute Rehabilitation Services Pager: [phone] Office: [phone]    Hortencia Pilar 11/10/2020, 11:11 AM

## 2020-11-10 NOTE — Progress Notes (Signed)
SLP Cancellation Note  Patient Details Name: [name] MRN: [id number] DOB: [birth year]   Cancelled treatment:       Reason Eval/Treat Not Completed: Patient at procedure or test/unavailable (Pt off unit at HD at this time. SLP will follow up later as schedule allows)  Ferron Ishmael I. Hardin Negus, [place], [place] Office number [phone] Pager [place] 11/10/2020, 4:59 PM

## 2020-11-10 NOTE — Progress Notes (Signed)
[facility] NEPHROLOGY PROGRESS NOTE  Assessment/ Plan: Pt is a 65 y.o. yo female  with history of HTN, HLD, peripheral vascular disease status post bilateral BKA, DM, anemia, ESRD on HD MWF at [facility] presented with cough, shortness of breath and generalized weakness, seen as a consultation for the management of dialysis.  Dialysis Orders from previous admission:  [facility] - MWF 4 hrs 400/500 97 kg 2.0K/2.5 Ca 16g needles -Heparin 1000 units initial bolus-heparin pump 1000 units/hr stop 1 hour prior to end of tx  -Epogen 14000 units IV TIW -Hectorol 1.5 mcg IV TIW  # ESRD MWF schedule.  S/p CRRT, stopped 7/12.  Had HD overnight 7/13-14, will do HD today to resume MWF schedule.  DIfficulty using her fistula NOS last tx, duplex negative so will reattempt using AVF today.  If problems persist will address with IR, fistulogram vs TDC conversion.   #Septic shock due to left lung pneumonia: Patient was transferred to [facility].  Vent and pressor support per CCM.  Abx per primary service. Ct 7/7 without source of infection.  Shock improved. Remains on midodrine.  #Thrombosed left radial artery/left hand ischemia -2/2 previous a line and pressors -s/p left radial artery thrombectomy and angio 7/9 w/ VVS  #Possible inferior STEMI -not a candidate for intervention currently. Per cardiology  #A. fib with RVR: Currently on amiodarone and heparin.  Appears plans for conversion to eliquis - please hold on this until AVF/access issue clarified.  Cardiology is following.   # Anemia: Hemoglobin 9.6  today.  Continue to monitor.  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 Feeling weak but no focal complaints.   Objective Vital signs in last 24 hours: Vitals:   11/09/20 1204 11/09/20 2005 11/10/20 0439 11/10/20 0500  BP: (!) 156/39 (!) 123/36 (!) 125/31   Pulse: 87     Resp: 18     Temp: 98.3 F (36.8 C) 98.2 F (36.8 C) 98.3 F (36.8 C)   TempSrc: Oral Oral Oral   SpO2: 100%     Weight:    87.8 kg  Height:       Weight change: 2.8 kg  Intake/Output Summary (Last 24 hours) at 11/10/2020 1227 Last data filed at 11/09/2020 1700 Gross per 24 hour  Intake 793.71 ml  Output --  Net 793.71 ml        Labs: Basic Metabolic Panel: Recent Labs  Lab 11/09/20 0410 11/09/20 2000 11/10/20 0522  NA 133* 135 134*  K 4.2 4.6 5.3*  CL 98 98 100  CO2 26 23 19*  GLUCOSE 208* 136* 72  BUN 24* 41* 51*  CREATININE 2.82* 3.86* 4.52*  CALCIUM 8.6* 8.7* 9.1  PHOS 2.8 3.4 4.2    Liver Function Tests: Recent Labs  Lab 11/09/20 0410 11/09/20 2000 11/10/20 0522  ALBUMIN 2.1* 2.0* 2.1*    No results for input(s): LIPASE, AMYLASE in the last 168 hours. No results for input(s): AMMONIA in the last 168 hours. CBC: Recent Labs  Lab 11/06/20 0421 11/06/20 1643 11/07/20 0455 11/08/20 0334 11/09/20 0410 11/10/20 0522  WBC 18.8*  --  25.9* 27.8* 21.8* 21.8*  HGB  7.5*   < > 10.0* 10.0* 9.3* 9.6*  HCT 25.3*   < > 33.9* 33.4* 30.7* 31.0*  MCV 80.8  --  81.5 81.3 79.3* 78.5*  PLT 342  --  455* 559* 602* 705*   < > = values in this interval not displayed.    Cardiac Enzymes: No results for input(s): CKTOTAL, CKMB, CKMBINDEX, TROPONINI in the last 168 hours. CBG: Recent Labs  Lab 11/09/20 1907 11/09/20 2310 11/10/20 0303 11/10/20 0729 11/10/20 1137  GLUCAP 130* 191* 117* 79 113*     Iron Studies: No results for input(s): IRON, TIBC, TRANSFERRIN, FERRITIN in the last 72 hours. Studies/Results: DG Swallowing Func-Speech Pathology  Result Date: 11/09/2020 Formatting of this result is different from the  original. Objective Swallowing Evaluation: Type of Study: MBS-Modified Barium Swallow Study  Patient Details Name: Christy Zhang MRN: [id number] Date of Birth: July 22, 1955 Today's Date: 11/09/2020 Time: SLP Start Time (ACUTE ONLY): [id number] -SLP Stop Time (ACUTE ONLY): 0840 SLP Time Calculation (min) (ACUTE ONLY): 12 min Past Medical History: Past Medical History: Diagnosis Date  Anemia   Arthritis   Asthma   in the past  CHF (congestive heart failure) ([place])   Diabetes mellitus with complication ([place])   Type 2  End stage renal disease on dialysis ([place])   Gangrene ([place])   Hyperlipidemia   Hypertension  Past Surgical History: Past Surgical History: Procedure Laterality Date   LEFT BELOW KNEE AMPUTATION (Left Knee)  06/08/2020  A/V FISTULAGRAM Right 02/04/2017  Procedure: A/V Fistulagram;  Surgeon: Waynetta Sandy, MD;  Location: [place] [facility];  Service: Cardiovascular;  Laterality: Right;  ABDOMINAL AORTOGRAM W/LOWER EXTREMITY N/A 11/18/2019  Procedure: ABDOMINAL AORTOGRAM W/LOWER EXTREMITY;  Surgeon: [name], MD;  Location: [place] [facility];  Service: Cardiovascular;  Laterality: N/A;  ABDOMINAL AORTOGRAM W/LOWER EXTREMITY N/A 02/23/2020  Procedure: ABDOMINAL AORTOGRAM W/LOWER EXTREMITY;  Surgeon: [name], MD;  Location: [place] [facility];  Service: Cardiovascular;  Laterality: N/A;  Bilateral  ABDOMINAL AORTOGRAM W/LOWER EXTREMITY N/A 08/10/2020  Procedure: ABDOMINAL AORTOGRAM W/LOWER EXTREMITY;  Surgeon: [name], MD;  Location: [place] [facility];  Service: Cardiovascular;  Laterality: N/A;  AMPUTATION Left 06/08/2020  Procedure: LEFT BELOW KNEE AMPUTATION;  Surgeon: Cherre Robins, MD;  Location: [place];  Service: Vascular;  Laterality: Left;  AMPUTATION Right 09/14/2020  Procedure: RIGHT BELOW KNEE AMPUTATION;  Surgeon: Waynetta Sandy, MD;  Location: [place];  Service: Vascular;  Laterality: Right;  AV FISTULA PLACEMENT Right 08/25/2014  Procedure: RIGHT  BRACHIOCEPHALIC ARTERIOVENOUS (AV) FISTULA CREATION;  Surgeon: Mal Misty, MD;  Location: [place];  Service: Vascular;  Laterality: Right;  AV FISTULA PLACEMENT Right 03/18/2019  Procedure: INSERTION OF ARTERIOVENOUS (AV) GORE-TEX GRAFT RIGHT ARM;  Surgeon: Serafina Mitchell, MD;  Location: [place];  Service: Vascular;  Laterality: Right;  [place] Right 12/24/2018  Procedure: FIRST STAGE BASILIC VEIN TRANSPOSITION RIGHT UPPER ARM;  Surgeon: Serafina Mitchell, MD;  Location: [place];  Service: Vascular;  Laterality: Right;  FISTULA SUPERFICIALIZATION Right 11/03/2014  Procedure: RIGHT UPPER ARM FISTULA SUPERFICIALIZATION;  Surgeon: Mal Misty, MD;  Location: [place];  Service: Vascular;  Laterality: Right;  FISTULOGRAM Right 01/16/2016  Procedure: RIGHT UPPER EXTREMITY ARTERIOVENOUS FISTULOGRAM WITH BALLOON ANGIOPLASTY;  Surgeon: Vickie Epley, MD;  Location: [facility];  Service: Vascular;  Laterality: Right;  INSERTION OF DIALYSIS CATHETER    X4  IR FLUORO GUIDE CV LINE RIGHT  12/03/2018  IR REMOVAL  TUN CV CATH W/O FL  07/06/2019  IR THROMBECTOMY AV FISTULA W/THROMBOLYSIS/PTA INC/SHUNT/IMG RIGHT Right 12/03/2018  IR THROMBECTOMY AV FISTULA W/THROMBOLYSIS/PTA/STENT INC/SHUNT/IMG RT Right 10/11/2019  IR US GUIDE VASC ACCESS RIGHT  12/03/2018  IR US GUIDE VASC ACCESS RIGHT  12/03/2018  IR US GUIDE VASC ACCESS RIGHT  10/14/2019  LIGATION OF COMPETING BRANCHES OF ARTERIOVENOUS FISTULA Right 11/03/2014  Procedure: LIGATION OF COMPETING BRANCHE x1 OF RIGHT UPPER ARM ARTERIOVENOUS FISTULA;  Surgeon: Mal Misty, MD;  Location: [place];  Service: Vascular;  Laterality: Right;  PERIPHERAL VASCULAR BALLOON ANGIOPLASTY Right 02/04/2017  Procedure: PERIPHERAL VASCULAR BALLOON ANGIOPLASTY;  Surgeon: Waynetta Sandy, MD;  Location: [place] [facility];  Service: Cardiovascular;  Laterality: Right;  PERIPHERAL VASCULAR BALLOON ANGIOPLASTY Right 11/18/2019  Procedure: PERIPHERAL VASCULAR BALLOON ANGIOPLASTY;  Surgeon: [name], MD;  Location: [place] [facility];  Service: Cardiovascular;  Laterality: Right;  anterior tibial  PERIPHERAL VASCULAR BALLOON ANGIOPLASTY  02/23/2020  Procedure: PERIPHERAL VASCULAR BALLOON ANGIOPLASTY;  Surgeon: [name], MD;  Location: [place] [facility];  Service: Cardiovascular;;  Lt  AT  PERIPHERAL VASCULAR INTERVENTION Right 08/10/2020  Procedure: PERIPHERAL VASCULAR INTERVENTION;  Surgeon: [name], MD;  Location: [place] [facility];  Service: Cardiovascular;  Laterality: Right;  THROMBECTOMY BRACHIAL ARTERY Left 11/04/2020  Procedure: THROMBECTOMY RADIAL ARTERY with Bovine Patching.;  Surgeon: Cherre Robins, MD;  Location: [place];  Service: Vascular;  Laterality: Left;  TRANSMETATARSAL AMPUTATION Left 02/24/2020  Procedure: TRANSMETATARSAL AMPUTATION LEFT;  Surgeon: Cherre Robins, MD;  Location: [place];  Service: Vascular;  Laterality: Left;  TRANSMETATARSAL AMPUTATION Right 08/11/2020  Procedure: RIGHT TRANSMETATARSAL AMPUTATION;  Surgeon: Waynetta Sandy, MD;  Location: [facility];  Service: Vascular;  Laterality: Right;  TUBAL LIGATION   HPI: Pt is a 65 yo female presented 7/1 with septic shock from PNA, requiring intubation (7/3-7/11) and CRRT (started 7/3). Brief PEA 7/3. PMH includes: CHF, CKD, T2DM, ESRD, HLD, HTN  Subjective: alert, cooperative, a little confused Assessment / Plan / Recommendation CHL IP CLINICAL IMPRESSIONS 11/09/2020 Clinical Impression Pt has a mild oropharyngeal dysphagia, with oral phase functional for most consistencies but with inadequate mastication of solids that leaves significant lingual residue. Otherwise, she propels liquid boluses in particularly very quickly to her pharynx. Thin liquids move so quickly that she spills them directly into her airway before the swallow, resulting in sensed aspiration. Although view is somewhat limited by shoulders, this does not appear to clear. Her hyoid excursion and epiglottic inversion are  also mildly reduced, although inversion improves with the weight of heavier boluses. Mild-moderate residue remains in the valleculae with heavier consistencies as well though. Recommend starting with Dys 1 (puree) diet and nectar thick liquids. SLP Visit Diagnosis Dysphagia, oropharyngeal phase (R13.12) Attention and concentration deficit following -- Frontal lobe and executive function deficit following -- Impact on safety and function Mild aspiration risk   CHL IP TREATMENT RECOMMENDATION 11/09/2020 Treatment Recommendations Therapy as outlined in treatment plan below   Prognosis 11/09/2020 Prognosis for Safe Diet Advancement Good Barriers to Reach Goals Cognitive deficits Barriers/Prognosis Comment -- CHL IP DIET RECOMMENDATION 11/09/2020 SLP Diet Recommendations Dysphagia 1 (Puree) solids;Nectar thick liquid Liquid Administration via Cup;Straw Medication Administration Crushed with puree Compensations Minimize environmental distractions;Slow rate;Small sips/bites Postural Changes Seated upright at 90 degrees   CHL IP OTHER RECOMMENDATIONS 11/09/2020 Recommended Consults -- Oral Care Recommendations Oral care BID Other Recommendations Prohibited food (jello, ice cream, thin soups);Remove water pitcher   CHL  IP FOLLOW UP RECOMMENDATIONS 11/09/2020 Follow up Recommendations Home health SLP   CHL IP FREQUENCY AND DURATION 11/09/2020 Speech Therapy Frequency (ACUTE ONLY) min 2x/week Treatment Duration 2 weeks      CHL IP ORAL PHASE 11/09/2020 Oral Phase Impaired Oral - Pudding Teaspoon -- Oral - Pudding Cup -- Oral - Honey Teaspoon -- Oral - Honey Cup -- Oral - Nectar Teaspoon WFL Oral - Nectar Cup WFL Oral - Nectar Straw WFL Oral - Thin Teaspoon WFL Oral - Thin Cup -- Oral - Thin Straw -- Oral - Puree WFL Oral - Mech Soft -- Oral - Regular Impaired mastication;Lingual/palatal residue Oral - Multi-Consistency -- Oral - Pill -- Oral Phase - Comment --  CHL IP PHARYNGEAL PHASE 11/09/2020 Pharyngeal Phase Impaired Pharyngeal-  Pudding Teaspoon -- Pharyngeal -- Pharyngeal- Pudding Cup -- Pharyngeal -- Pharyngeal- Honey Teaspoon -- Pharyngeal -- Pharyngeal- Honey Cup -- Pharyngeal -- Pharyngeal- Nectar Teaspoon Reduced anterior laryngeal mobility Pharyngeal -- Pharyngeal- Nectar Cup Reduced anterior laryngeal mobility Pharyngeal -- Pharyngeal- Nectar Straw Reduced anterior laryngeal mobility Pharyngeal -- Pharyngeal- Thin Teaspoon Reduced anterior laryngeal mobility;Reduced epiglottic inversion;Penetration/Aspiration before swallow Pharyngeal Material enters airway, passes BELOW cords and not ejected out despite cough attempt by patient Pharyngeal- Thin Cup -- Pharyngeal -- Pharyngeal- Thin Straw -- Pharyngeal -- Pharyngeal- Puree Reduced anterior laryngeal mobility;Pharyngeal residue - valleculae Pharyngeal -- Pharyngeal- Mechanical Soft -- Pharyngeal -- Pharyngeal- Regular Reduced anterior laryngeal mobility;Pharyngeal residue - valleculae Pharyngeal -- Pharyngeal- Multi-consistency -- Pharyngeal -- Pharyngeal- Pill -- Pharyngeal -- Pharyngeal Comment --  CHL IP CERVICAL ESOPHAGEAL PHASE 11/09/2020 Cervical Esophageal Phase WFL Pudding Teaspoon -- Pudding Cup -- Honey Teaspoon -- Honey Cup -- Nectar Teaspoon -- Nectar Cup -- Nectar Straw -- Thin Teaspoon -- Thin Cup -- Thin Straw -- Puree -- Mechanical Soft -- Regular -- Multi-consistency -- Pill -- Cervical Esophageal Comment -- Osie Bond., M.A. CCC-SLP Acute Rehabilitation Services Pager [phone] Office [phone] 11/09/2020, 9:17 AM              VAS US DUPLEX DIALYSIS ACCESS (AVF, AVG)  Result Date: 11/09/2020 DIALYSIS ACCESS Patient Name:  Christy Zhang  Date of Exam:   11/09/2020 Medical Rec #: [id number]          Accession #:    [id number] Date of Birth: [birth year]          Patient Gender: F Patient Age:   65Y Exam Location:  [facility] Procedure:      VAS US DUPLEX DIALYSIS ACCESS (AVF, AVG) Referring Phys: [id number] Armando Gang HAN  --------------------------------------------------------------------------------  Reason for Exam: Malfunctioning right AV Graft, prefistulagram evaluation. Access Site: Right Upper Extremity. Access Type: Upper arm loop AVG. Limitations: body habitus and tissue properties Comparison Study: no prior Performing Technologist: Archie Patten RVS  Examination Guidelines: A complete evaluation includes B-mode imaging, spectral Doppler, color Doppler, and power Doppler as needed of all accessible portions of each vessel. Unilateral testing is considered an integral part of a complete examination. Limited examinations for reoccurring indications may be performed as noted.  Findings:   +--------------------+----------+-----------------+--------+ AVG                 PSV (cm/s)Flow Vol (mL/min)Describe +--------------------+----------+-----------------+--------+ Native artery inflow   199            9                 +--------------------+----------+-----------------+--------+ Arterial anastomosis   429                              +--------------------+----------+-----------------+--------+  Prox graft             181                              +--------------------+----------+-----------------+--------+ Mid graft              155                              +--------------------+----------+-----------------+--------+ Distal graft           425                              +--------------------+----------+-----------------+--------+ Venous anastomosis     155                              +--------------------+----------+-----------------+--------+ Venous outflow         196                              +--------------------+----------+-----------------+--------+  Summary: No obvious evidence of thrombus/stenosis or perigraft fluid.  *See table(s) above for measurements and observations.  Diagnosing physician: Jamelle Haring Electronically signed by Jamelle Haring on 11/09/2020 at  5:15:00 PM.    --------------------------------------------------------------------------------   Final     Medications: Infusions:  sodium chloride Stopped (11/07/20 1143)   sodium chloride     sodium chloride Stopped (11/09/20 1229)    ceFAZolin (ANCEF) IV     feeding supplement (VITAL 1.5 CAL) 1,000 mL (11/08/20 1211)   heparin 1,450 Units/hr (11/10/20 0921)    Scheduled Medications:  amiodarone  400 mg Oral BID   Followed by   [name] ON 11/13/2020] amiodarone  200 mg Oral Daily   aspirin  81 mg Per Tube Daily   atorvastatin  40 mg Per Tube Daily   chlorhexidine  15 mL Mouth Rinse BID   Chlorhexidine Gluconate Cloth  6 each Topical Q0600   Chlorhexidine Gluconate Cloth  6 each Topical Q0600   clopidogrel  75 mg Per Tube Daily   darbepoetin (ARANESP) injection - DIALYSIS  100 mcg Intravenous Q Fri-HD   feeding supplement (PROSource TF)  45 mL Per Tube TID   insulin aspart  3-9 Units Subcutaneous Q4H   mouth rinse  15 mL Mouth Rinse q12n4p   midodrine  5 mg Per Tube TID WC   multivitamin  1 tablet Oral QHS   nystatin   Topical BID   polyethylene glycol  17 g Per Tube Daily   sodium chloride flush  10-40 mL Intracatheter Q12H    have reviewed scheduled and prn medications.  Physical Exam: General: Chronically ill appearing, but in no distress Heart:RRR, s1s2 nl Lungs: [place], b/l chest expansion Abdomen:soft,  non-distended Extremities: Bilateral BKA, no stump/thigh edema, R stump with scab no erythema or drainage; left hand with several necrotic digits and hand with patchy duskiness that is stable Neuro: awake, following commands Dialysis Access: Left IJ temporary HD catheter for CRRT.  AV fistula has + thrill and bruit but bruit quiet now.   Justin Mend 11/10/2020,12:27 PM  LOS: 14 days

## 2020-11-10 NOTE — Care Management Important Message (Signed)
Important Message  Patient Details  Name: Christy Zhang MRN: [id number] Date of Birth: 12/21/1955   Medicare Important Message Given:  Yes     [name] 11/10/2020, 2:25 PM

## 2020-11-10 NOTE — Progress Notes (Signed)
Daily Progress Note   Patient Name: Christy Zhang       Date: 11/10/2020 DOB: 28-Dec-1955  Age: 65 y.o. MRN#: [id number] Attending Physician: Shelly Coss, MD Primary Care Physician: Claretta Fraise, MD Admit Date: 10/27/2020 Length of Stay: 14 days  Reason for Consultation/Follow-up: Establishing goals of care  HPI/Patient Profile:  65 y.o. female  with past medical history of HTN, HLD, peripheral vascular disease status post bilateral BKA, DM, anemia, ESRD on HD MWF at [facility] admitted on 10/27/2020 with cough, shortness of breath, and generalized weakness. She was admitted for pneumonia.  She developed septic shock and hypotensions and was supsequently admitted to the ICU for CRRT given hemodynamic instability. She had a brief PEA Arrest on 7/3 and presumed MI/STEMI on 7/8, now felt to be demand ischemia per cardiology.   She had also lost left radial pulse due to radial artery occlusion and her fingers are dusky and mottled. Went to OR with [facility] who repaired the artery. 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 She was extubated and transitioned from CRRT to HD, coretrack placed, transferred to Progressive care unit, making progress.  Current Medications: Scheduled Meds:   amiodarone  400 mg Oral BID   Followed by   [name] ON 11/13/2020] amiodarone  200 mg Oral Daily   aspirin  81 mg Per Tube Daily   atorvastatin  40 mg Per Tube Daily   chlorhexidine  15 mL Mouth Rinse BID   Chlorhexidine Gluconate Cloth  6 each Topical Q0600   Chlorhexidine Gluconate Cloth  6 each Topical Q0600   clopidogrel  75 mg Per Tube Daily   darbepoetin (ARANESP) injection - DIALYSIS  100 mcg Intravenous Q Fri-HD   feeding supplement (NEPRO CARB STEADY)  1,000 mL Per Tube Q24H   feeding supplement (PROSource TF)  45 mL Per Tube QID   insulin aspart  3-9 Units Subcutaneous Q4H   mouth rinse  15 mL Mouth Rinse q12n4p   midodrine  5 mg Per Tube TID WC   multivitamin  1 tablet  Oral QHS   nystatin   Topical BID   polyethylene glycol  17 g Per Tube Daily   sodium chloride flush  10-40 mL Intracatheter Q12H    Continuous Infusions:  sodium chloride Stopped (11/07/20 1143)   sodium chloride     sodium chloride Stopped (11/09/20 1229)    ceFAZolin (ANCEF) IV     heparin 1,450 Units/hr (11/10/20 1336)    PRN Meds: sodium chloride, sodium chloride, sodium chloride, acetaminophen (TYLENOL) oral liquid 160 mg/5 mL, albuterol, alteplase, alteplase, bisacodyl, Gerhardt's butt cream, heparin, lidocaine (PF), lidocaine-prilocaine, metoprolol tartrate, pentafluoroprop-tetrafluoroeth, sodium chloride, sodium chloride flush  Subjective:   Subjective: Chart Reviewed. Updates received. Patient Assessed. Created space and opportunity for patient  and family to explore thoughts and feelings regarding current medical situation.  Today's Discussion: Met with the patient at the bedside. She feels better, feels like she's getting stronger. No overt pain, breathing doing well. Tanks me for all the care she had when sick. States she's not sure about her hand. No other significant symptoms.  We reviewed CODE STATUS as decided by her daughters who made decisions while she was critically ill. She agrees no CPR/Shocks/Meds. She is amendable to intubation if needed. Discussed this is what her daughters had decided and we will leave her limited code in place.  Review of Systems  Constitutional:  Positive for fatigue.       Denies  general pain  Respiratory:  Negative for cough, chest tightness and shortness of breath.   Cardiovascular:  Negative for chest pain.  Gastrointestinal:  Negative for abdominal pain, nausea and vomiting.   Objective:   Vital Signs: BP (!) 116/29   Pulse 80   Temp 98.1 F (36.7 C) (Oral)   Resp 19   Ht 5' 4"  (1.626 m)   Wt 87.7 kg   LMP  (LMP Unknown)   SpO2 100%   BMI 33.19 kg/m  SpO2: SpO2: 100 % O2 Device: O2 Device: Room Air O2 Flow Rate: O2  Flow Rate (L/min): 2 L/min  Physical Exam: Physical Exam Vitals and nursing note reviewed.  Constitutional:      General: She is not in acute distress.    Appearance: Normal appearance. She is obese. She is ill-appearing. She is not toxic-appearing.  HENT:     Head: Normocephalic and atraumatic.  Pulmonary:     Effort: Pulmonary effort is normal.  Abdominal:     Palpations: Abdomen is soft.     Tenderness: There is no abdominal tenderness.  Skin:    General: Skin is warm and dry.     Comments: Discoloration to left hand, surgical incision area with dressing in place  Neurological:     General: No focal deficit present.     Mental Status: She is alert.  Psychiatric:        Mood and Affect: Mood normal.        Behavior: Behavior normal.    SpO2: SpO2: 100 % O2 Device:SpO2: 100 % O2 Flow Rate: .O2 Flow Rate (L/min): 2 L/min  IO: Intake/output summary:  Intake/Output Summary (Last 24 hours) at 11/10/2020 1520 Last data filed at 11/10/2020 1336 Gross per 24 hour  Intake 1091.59 ml  Output --  Net 1091.59 ml    LBM: Last BM Date: 11/10/20 Baseline Weight: Weight: 94 kg Most recent weight: Weight: 87.7 kg   Palliative Assessment/Data: 40%   Assessment & Plan:   Impression:  Very sick 65 year old female with multiple comorbidities now with likely pneumonia sepsis, incident of myocardial demand ischemia, occluded left radial artery status postsurgical repair, now s/p extubation and aware, follows commands, answers questions. CRRT being discontinued at this time. Plans for HD moving forward now that she's more hemodynamically stable and off pressors.    Continues to make progress. Reaffirmed CODE STATUS with the patient. Continue to treat the treatable, make progress as able.  SUMMARY OF RECOMMENDATIONS   Keep limited code Continue to treat the treatable Continue with even aggressive measures including CRRT or hemodialysis, vent support if/as needed. Continued PMT  support  Code Status: Limited code  Symptom Management: No palliative symptom management needs identified at this time We are available as needed for symptom management  Prognosis: Unable to determine  Discharge Planning: To Be Determined  Discussed with: Patient  Thank you for allowing [place] to participate in the care of Christy Zhang PMT will continue to support holistically.  Time Total: 35 min  Visit consisted of counseling and education dealing with the complex and emotionally intense issues of symptom management and palliative care in the setting of serious and potentially life-threatening illness. Greater than 50%  of this time was spent counseling and coordinating care related to the above assessment and plan.  Walden Field, NP Palliative Medicine Team  Team Phone # [phone] (Nights/Weekends)  11/10/2020, 3:20 PM

## 2020-11-10 NOTE — Progress Notes (Signed)
PROGRESS NOTE    Christy Zhang  [id number] DOB: 29-Jan-1956 DOA: 10/27/2020 PCP: Claretta Fraise, MD   Chief Complain: Altered mental status  Brief Narrative: Patient is a 75 old female with history of bilateral BKA, ESRD on dialysis on Monday/Wednesday/Friday, type 2 diabetes, hypertension, peripheral vascular disease, hyperlipidemia who presented to the emergency department for the evaluation of altered mental status.  She was complaining of 2-week history of intermittent shortness of breath, dry cough, fatigue, weakness.  On presentation she was hypertensive.  Lab work showed leukocytosis, hyponatremia, hyperglycemia, creatinine of 6.4, lactic acid of 2.9.  Chest x-ray showed acute consolidation/pneumonia with fullness.  Patient was started on Rocephin antibiotics for septic shock, started on IV fluids.  She was intubated for airway protection, put on mechanical ventilation.  She also was started on CRRT on 7/3 in the setting of septic shock from pneumonia causing acute respiratory failure with hypoxia and AKI.  Hospital course remarkable for new onset A. fib, cardiology were following. Overall, she is improving.  Plan for fistulogram today.  Assessment & Plan:   Principal Problem:   CAP (community acquired pneumonia) Active Problems:   Anemia of chronic disease   Essential (primary) hypertension   Hyperglycemia due to diabetes mellitus ([place])   Leucocytosis   Thrombocytosis   ESRD on hemodialysis (HCC)   PVD (peripheral vascular disease)/ S/p BKA   Altered mental status   Acute respiratory failure with hypoxemia (HCC)   Lactic acidosis   Hypoalbuminemia   Hyponatremia   Hyperlipidemia   Cardiac arrest (HCC)   Paroxysmal atrial fibrillation ([place])  Septic shock: Resolving.  Blood pressure better.  Pressors were weaned  on 7/11.  Also on midodrine.  Acute respiratory failure with hypoxia/hypercarbia: Secondary to severe community-acquired pneumonia.  Was intubated and on  mechanical ventilation.  Now extubated.  PCCM were following.  She finished a course of antibiotics.  Currently on room air  ESRD on hemodialysis: Nephrology following.  CRRT stopped on 7/12.  Underwent dialysis.  Previous AVF was not usable for hemodialysis so IR consulted for fistulogram.  Has working temporary hemodialysis catheter on the left neck  Parapneumonic effusion: Drained, chest tube has been taken out  New onset A. fib/flutter: Currently in normal sinus rhythm.  On oral amiodarone.  Plan is to convert the IV heparin to Eliquis at some point/after finishing procedure  Elevated troponin: Most likely secondary to demand ischemia in the setting of sepsis.  Troponins were flat trend.  2D echo showed normal left ventricular function.  No further work-up recommended by cardiology.  Left radial artery thrombotic occlusion: Status post thrombectomy, distal ischemia.  Continue local wound care.  She may need amputation at some point, vascular surgery has been following and will address that  Hyperlipidemia: Continue statin  Constipation: Continue bowel regimen  Leukocytosis/thrombocytosis: On reviewing her previous records, she always has chronic leukocytosis along with thrombocytosis.  Could not rule out hematological pathology.  I have sent a message to Dr. Irene Limbo for inpatient consult versus outpatient follow-up  Normocytic anemia: Currently hemoglobin stable.  Continue to monitor  Diabetes type 2: Hospital course remarkable for hypoglycemia.  Monitor blood sugars.  On tube feeding through core track.  Weakness/deconditioning: PT/OT consulted.  Recommended home health PT on discharge  Dysphagia: Speech therapy following.  Recommended dysphagia 1 diet.  We are checking with dietitian/speech therapy about taking the tube out  Suspected left lower pole renal neoplasm: Work-up as an outpatient recommended.  Nutrition Problem: Increased nutrient needs Etiology:  acute illness      DVT  prophylaxis: IV heparin Code Status: Full Family Communication: Called and discussed with daughter on phone on 11/10/20 Status is: Inpatient  Remains inpatient appropriate because:Inpatient level of care appropriate due to severity of illness  Dispo: The patient is from: Home              Anticipated d/c is to: Home              Patient currently is not medically stable to d/c.   Difficult to place patient No    Consultants: PCCM, cardiology, nephrology  Procedures: CRRT, intubation  Important events:  Admitted to hospital on 7/1 7/1 cefepime >  7/1 flagyl x1 7/1 vancomycin>  Admitted to ICU on 10/29/20 for CRRT 7/3 - brief PEA with bearing down while agitated. 7/3 ETT 7/3 left radial arterial line 7/3 L IJ HD cath 7/5 L pigtail CT placed for L pleural effusion 7/6 CT output -550 cc 7/8 A fib, ST elevation noted per EKG, Concern for ischemia, cards consulted 7/12 extubated  Antimicrobials:  Anti-infectives (From admission, onward)    Start     Dose/Rate Route Frequency Ordered Stop   11/10/20 0600  ceFAZolin (ANCEF) IVPB 2g/100 mL premix        2 g 200 mL/hr over 30 Minutes Intravenous To Radiology 11/09/20 1453 11/11/20 0600   11/03/20 2200  vancomycin (VANCOCIN) IVPB 1000 mg/200 mL premix  Status:  Discontinued        1,000 mg 200 mL/hr over 60 Minutes Intravenous Every 24 hours 11/02/20 2017 11/06/20 0750   11/02/20 2115  vancomycin (VANCOREADY) IVPB 1250 mg/250 mL        1,250 mg 166.7 mL/hr over 90 Minutes Intravenous  Once 11/02/20 2017 11/02/20 2245   11/02/20 1930  meropenem (MERREM) 1 g in sodium chloride 0.9 % 100 mL IVPB  Status:  Discontinued        1 g 200 mL/hr over 30 Minutes Intravenous Every 8 hours 11/02/20 1841 11/06/20 0750   10/30/20 1200  vancomycin (VANCOCIN) IVPB 750 mg/150 ml premix  Status:  Discontinued        750 mg 150 mL/hr over 60 Minutes Intravenous Every M-W-F (Hemodialysis) 10/28/20 0521 10/28/20 1311   10/30/20 1200  vancomycin  (VANCOCIN) IVPB 1000 mg/200 mL premix  Status:  Discontinued        1,000 mg 200 mL/hr over 60 Minutes Intravenous Every M-W-F (Hemodialysis) 10/28/20 1311 10/29/20 0639   10/29/20 1800  vancomycin (VANCOCIN) IVPB 1000 mg/200 mL premix  Status:  Discontinued        1,000 mg 200 mL/hr over 60 Minutes Intravenous Every 24 hours 10/29/20 0639 10/31/20 1044   10/29/20 1000  ceFEPIme (MAXIPIME) 2 g in sodium chloride 0.9 % 100 mL IVPB  Status:  Discontinued        2 g 200 mL/hr over 30 Minutes Intravenous Every 12 hours 10/29/20 0637 11/02/20 1718   10/28/20 2000  ceFEPIme (MAXIPIME) 1 g in sodium chloride 0.9 % 100 mL IVPB  Status:  Discontinued        1 g 200 mL/hr over 30 Minutes Intravenous Every 24 hours 10/27/20 2109 10/29/20 0637   10/27/20 2100  vancomycin (VANCOCIN) IVPB 1000 mg/200 mL premix        1,000 mg 200 mL/hr over 60 Minutes Intravenous  Once 10/27/20 2057 10/28/20 0008   10/27/20 1900  ceFEPIme (MAXIPIME) 2 g in sodium chloride 0.9 % 100 mL IVPB  2 g 200 mL/hr over 30 Minutes Intravenous  Once 10/27/20 1848 10/27/20 2128   10/27/20 1900  metroNIDAZOLE (FLAGYL) IVPB 500 mg        500 mg 100 mL/hr over 60 Minutes Intravenous  Once 10/27/20 1848 10/27/20 2128   10/27/20 1900  vancomycin (VANCOCIN) IVPB 1000 mg/200 mL premix        1,000 mg 200 mL/hr over 60 Minutes Intravenous  Once 10/27/20 1848 10/27/20 2046       Subjective: Patient seen and examined the bedside this morning.  Hemodynamically stable.  She looks more comfortable, more alert today.  She is alert and oriented.  Denies any new complaints.  Objective: Vitals:   11/09/20 1204 11/09/20 2005 11/10/20 0439 11/10/20 0500  BP: (!) 156/39 (!) 123/36 (!) 125/31   Pulse: 87     Resp: 18     Temp: 98.3 F (36.8 C) 98.2 F (36.8 C) 98.3 F (36.8 C)   TempSrc: Oral Oral Oral   SpO2: 100%     Weight:    87.8 kg  Height:        Intake/Output Summary (Last 24 hours) at 11/10/2020 1330 Last data filed at  11/09/2020 1700 Gross per 24 hour  Intake 793.71 ml  Output --  Net 793.71 ml   Filed Weights   11/08/20 0656 11/09/20 0500 11/10/20 0500  Weight: 83.8 kg 85 kg 87.8 kg    Examination:   General exam: Overall comfortable, obese, significantly deconditioned, chronically ill looking,  HEENT: Feeding tube , temporary dialysis catheter on the left neck Respiratory system:  no wheezes or crackles  Cardiovascular system: S1 & S2 heard, RRR.  Gastrointestinal system: Abdomen is nondistended, soft and nontender. Central nervous system: Alert and oriented Extremities: Bilateral BKA, gangrenous left hand Skin: No rashes, no ulcers,no icterus      Data Reviewed: I have personally reviewed following labs and imaging studies  CBC: Recent Labs  Lab 11/06/20 0421 11/06/20 1643 11/07/20 0455 11/08/20 0334 11/09/20 0410 11/10/20 0522  WBC 18.8*  --  25.9* 27.8* 21.8* 21.8*  HGB 7.5* 11.6* 10.0* 10.0* 9.3* 9.6*  HCT 25.3* 34.0* 33.9* 33.4* 30.7* 31.0*  MCV 80.8  --  81.5 81.3 79.3* 78.5*  PLT 342  --  455* 559* 602* [id number]*   Basic Metabolic Panel: Recent Labs  Lab 11/06/20 0421 11/06/20 1606 11/07/20 0455 11/07/20 1620 11/08/20 0334 11/08/20 1527 11/09/20 0410 11/09/20 2000 11/10/20 0522  NA 136   < > 136   < > 134* 132* 133* 135 134*  K 3.1*   < > 3.9   < > 3.9 4.6 4.2 4.6 5.3*  CL 104   < > 103   < > 101 97* 98 98 100  CO2 24   < > 25   < > '24 22 26 23 '$ 19*  GLUCOSE 77   < > 70   < > 82 82 208* 136* 72  BUN 17   < > 12   < > 24* 31* 24* 41* 51*  CREATININE 1.32*   < > 1.29*   < > 2.66* 3.56* 2.82* 3.86* 4.52*  CALCIUM 8.2*   < > 9.0   < > 9.3 9.5 8.6* 8.7* 9.1  MG 2.4  --  2.8*  --  3.4*  --  2.5*  --  2.8*  PHOS 3.0   < > 2.3*   < > 4.6 4.8* 2.8 3.4 4.2   < > = values in this interval  not displayed.   GFR: Estimated Creatinine Clearance: 13.7 mL/min (A) (by C-G formula based on SCr of 4.52 mg/dL (H)). Liver Function Tests: Recent Labs  Lab 11/08/20 0334  11/08/20 1527 11/09/20 0410 11/09/20 2000 11/10/20 0522  ALBUMIN 2.3* 2.5* 2.1* 2.0* 2.1*   No results for input(s): LIPASE, AMYLASE in the last 168 hours. No results for input(s): AMMONIA in the last 168 hours. Coagulation Profile: No results for input(s): INR, PROTIME in the last 168 hours. Cardiac Enzymes: No results for input(s): CKTOTAL, CKMB, CKMBINDEX, TROPONINI in the last 168 hours. BNP (last 3 results) No results for input(s): PROBNP in the last 8760 hours. HbA1C: No results for input(s): HGBA1C in the last 72 hours. CBG: Recent Labs  Lab 11/09/20 1907 11/09/20 2310 11/10/20 0303 11/10/20 0729 11/10/20 1137  GLUCAP 130* 191* 117* 79 113*   Lipid Profile: No results for input(s): CHOL, HDL, LDLCALC, TRIG, CHOLHDL, LDLDIRECT in the last 72 hours. Thyroid Function Tests: No results for input(s): TSH, T4TOTAL, FREET4, T3FREE, THYROIDAB in the last 72 hours. Anemia Panel: No results for input(s): VITAMINB12, FOLATE, FERRITIN, TIBC, IRON, RETICCTPCT in the last 72 hours. Sepsis Labs: No results for input(s): PROCALCITON, LATICACIDVEN in the last 168 hours.  Recent Results (from the past 240 hour(s))  Pleural fluid culture w Gram Stain     Status: None   Collection Time: 10/31/20  5:54 PM   Specimen: Pleural Fluid  Result Value Ref Range Status   Specimen Description FLUID PLEURAL LEFT  Final   Special Requests NONE  Final   Gram Stain NO WBC SEEN NO ORGANISMS SEEN   Final   Culture   Final    NO GROWTH 3 DAYS Performed at [facility], [address] [place], [place] [address]    Report Status 11/04/2020 FINAL  Final  Culture, Respiratory w Gram Stain     Status: None   Collection Time: 11/02/20  8:40 AM   Specimen: Tracheal Aspirate; Respiratory  Result Value Ref Range Status   Specimen Description TRACHEAL ASPIRATE  Final   Special Requests NONE  Final   Gram Stain   Final    FEW WBC PRESENT, PREDOMINANTLY PMN NO ORGANISMS SEEN    Culture    Final    RARE Normal respiratory flora-no Staph aureus or Pseudomonas seen Performed at [facility], [address], [place], [place] [address]    Report Status 11/04/2020 FINAL  Final  Culture, blood (routine x 2)     Status: None   Collection Time: 11/02/20  7:10 PM   Specimen: BLOOD LEFT HAND  Result Value Ref Range Status   Specimen Description BLOOD LEFT HAND  Final   Special Requests   Final    BOTTLES DRAWN AEROBIC ONLY Blood Culture results may not be optimal due to an inadequate volume of blood received in culture bottles   Culture   Final    NO GROWTH 5 DAYS Performed at [facility], [place] [address] [place], [place] [address]    Report Status 11/07/2020 FINAL  Final  Culture, blood (routine x 2)     Status: None   Collection Time: 11/02/20  7:10 PM   Specimen: BLOOD LEFT HAND  Result Value Ref Range Status   Specimen Description BLOOD LEFT HAND  Final   Special Requests   Final    BOTTLES DRAWN AEROBIC ONLY Blood Culture results may not be optimal due to an inadequate volume of blood received in  culture bottles   Culture   Final    NO GROWTH 5 DAYS Performed at [facility], [place] [address], [place], [place] [address]    Report Status 11/07/2020 FINAL  Final         Radiology Studies: DG Swallowing Func-Speech Pathology  Result Date: 11/09/2020 Formatting of this result is different from the original. Objective Swallowing Evaluation: Type of Study: MBS-Modified Barium Swallow Study  Patient Details Name: Christy Zhang MRN: [id number] Date of Birth: 1955/09/13 Today's Date: 11/09/2020 Time: SLP Start Time (ACUTE ONLY): [id number] -SLP Stop Time (ACUTE ONLY): 0840 SLP Time Calculation (min) (ACUTE ONLY): 12 min Past Medical History: Past Medical History: Diagnosis Date  Anemia   Arthritis   Asthma   in the past  CHF (congestive heart failure) ([place])   Diabetes mellitus with complication ([place])   Type 2  End stage renal disease on dialysis ([place])   Gangrene  ([place])   Hyperlipidemia   Hypertension  Past Surgical History: Past Surgical History: Procedure Laterality Date   LEFT BELOW KNEE AMPUTATION (Left Knee)  06/08/2020  A/V FISTULAGRAM Right 02/04/2017  Procedure: A/V Fistulagram;  Surgeon: Waynetta Sandy, MD;  Location: [facility];  Service: Cardiovascular;  Laterality: Right;  ABDOMINAL AORTOGRAM W/LOWER EXTREMITY N/A 11/18/2019  Procedure: ABDOMINAL AORTOGRAM W/LOWER EXTREMITY;  Surgeon: [name], MD;  Location: [facility];  Service: Cardiovascular;  Laterality: N/A;  ABDOMINAL AORTOGRAM W/LOWER EXTREMITY N/A 02/23/2020  Procedure: ABDOMINAL AORTOGRAM W/LOWER EXTREMITY;  Surgeon: [name], MD;  Location: [facility];  Service: Cardiovascular;  Laterality: N/A;  Bilateral  ABDOMINAL AORTOGRAM W/LOWER EXTREMITY N/A 08/10/2020  Procedure: ABDOMINAL AORTOGRAM W/LOWER EXTREMITY;  Surgeon: [name], MD;  Location: [facility];  Service: Cardiovascular;  Laterality: N/A;  AMPUTATION Left 06/08/2020  Procedure: LEFT BELOW KNEE AMPUTATION;  Surgeon: Cherre Robins, MD;  Location: [facility];  Service: Vascular;  Laterality: Left;  AMPUTATION Right 09/14/2020  Procedure: RIGHT BELOW KNEE AMPUTATION;  Surgeon: Waynetta Sandy, MD;  Location: [facility];  Service: Vascular;  Laterality: Right;  AV FISTULA PLACEMENT Right 08/25/2014  Procedure: RIGHT BRACHIOCEPHALIC ARTERIOVENOUS (AV) FISTULA CREATION;  Surgeon: Mal Misty, MD;  Location: [facility];  Service: Vascular;  Laterality: Right;  AV FISTULA PLACEMENT Right 03/18/2019  Procedure: INSERTION OF ARTERIOVENOUS (AV) GORE-TEX GRAFT RIGHT ARM;  Surgeon: Serafina Mitchell, MD;  Location: [facility];  Service: Vascular;  Laterality: Right;  [place] Right 12/24/2018  Procedure: FIRST STAGE BASILIC VEIN TRANSPOSITION RIGHT UPPER ARM;  Surgeon: Serafina Mitchell, MD;  Location: [facility];  Service: Vascular;  Laterality: Right;  FISTULA  SUPERFICIALIZATION Right 11/03/2014  Procedure: RIGHT UPPER ARM FISTULA SUPERFICIALIZATION;  Surgeon: Mal Misty, MD;  Location: [facility];  Service: Vascular;  Laterality: Right;  FISTULOGRAM Right 01/16/2016  Procedure: RIGHT UPPER EXTREMITY ARTERIOVENOUS FISTULOGRAM WITH BALLOON ANGIOPLASTY;  Surgeon: Vickie Epley, MD;  Location: [facility];  Service: Vascular;  Laterality: Right;  INSERTION OF DIALYSIS CATHETER    X4  IR FLUORO GUIDE CV LINE RIGHT  12/03/2018  IR REMOVAL TUN CV CATH W/O FL  07/06/2019  IR THROMBECTOMY AV FISTULA W/THROMBOLYSIS/PTA INC/SHUNT/IMG RIGHT Right 12/03/2018  IR THROMBECTOMY AV FISTULA W/THROMBOLYSIS/PTA/STENT INC/SHUNT/IMG RT Right 10/11/2019  IR US GUIDE VASC ACCESS RIGHT  12/03/2018  IR US GUIDE VASC ACCESS RIGHT  12/03/2018  IR US GUIDE VASC ACCESS RIGHT  10/14/2019  LIGATION OF COMPETING BRANCHES OF ARTERIOVENOUS FISTULA Right 11/03/2014  Procedure: LIGATION OF COMPETING BRANCHE x1 OF RIGHT UPPER ARM ARTERIOVENOUS FISTULA;  Surgeon: Mal Misty, MD;  Location: [facility];  Service: Vascular;  Laterality: Right;  PERIPHERAL VASCULAR BALLOON ANGIOPLASTY Right 02/04/2017  Procedure: PERIPHERAL VASCULAR BALLOON ANGIOPLASTY;  Surgeon: Waynetta Sandy, MD;  Location: [facility];  Service: Cardiovascular;  Laterality: Right;  PERIPHERAL VASCULAR BALLOON ANGIOPLASTY Right 11/18/2019  Procedure: PERIPHERAL VASCULAR BALLOON ANGIOPLASTY;  Surgeon: [name], MD;  Location: [facility];  Service: Cardiovascular;  Laterality: Right;  anterior tibial  PERIPHERAL VASCULAR BALLOON ANGIOPLASTY  02/23/2020  Procedure: PERIPHERAL VASCULAR BALLOON ANGIOPLASTY;  Surgeon: [name], MD;  Location: [facility];  Service: Cardiovascular;;  Lt  AT  PERIPHERAL VASCULAR INTERVENTION Right 08/10/2020  Procedure: PERIPHERAL VASCULAR INTERVENTION;  Surgeon: [name], MD;  Location: [facility];  Service: Cardiovascular;  Laterality: Right;  THROMBECTOMY  BRACHIAL ARTERY Left 11/04/2020  Procedure: THROMBECTOMY RADIAL ARTERY with Bovine Patching.;  Surgeon: Cherre Robins, MD;  Location: [facility];  Service: Vascular;  Laterality: Left;  TRANSMETATARSAL AMPUTATION Left 02/24/2020  Procedure: TRANSMETATARSAL AMPUTATION LEFT;  Surgeon: Cherre Robins, MD;  Location: [facility];  Service: Vascular;  Laterality: Left;  TRANSMETATARSAL AMPUTATION Right 08/11/2020  Procedure: RIGHT TRANSMETATARSAL AMPUTATION;  Surgeon: Waynetta Sandy, MD;  Location: [facility];  Service: Vascular;  Laterality: Right;  TUBAL LIGATION   HPI: Pt is a 65 yo female presented 7/1 with septic shock from PNA, requiring intubation (7/3-7/11) and CRRT (started 7/3). Brief PEA 7/3. PMH includes: CHF, CKD, T2DM, ESRD, HLD, HTN  Subjective: alert, cooperative, a little confused Assessment / Plan / Recommendation CHL IP CLINICAL IMPRESSIONS 11/09/2020 Clinical Impression Pt has a mild oropharyngeal dysphagia, with oral phase functional for most consistencies but with inadequate mastication of solids that leaves significant lingual residue. Otherwise, she propels liquid boluses in particularly very quickly to her pharynx. Thin liquids move so quickly that she spills them directly into her airway before the swallow, resulting in sensed aspiration. Although view is somewhat limited by shoulders, this does not appear to clear. Her hyoid excursion and epiglottic inversion are also mildly reduced, although inversion improves with the weight of heavier boluses. Mild-moderate residue remains in the valleculae with heavier consistencies as well though. Recommend starting with Dys 1 (puree) diet and nectar thick liquids. SLP Visit Diagnosis Dysphagia, oropharyngeal phase (R13.12) Attention and concentration deficit following -- Frontal lobe and executive function deficit following -- Impact on safety and function Mild aspiration risk   CHL IP TREATMENT RECOMMENDATION 11/09/2020 Treatment Recommendations Therapy as  outlined in treatment plan below   Prognosis 11/09/2020 Prognosis for Safe Diet Advancement Good Barriers to Reach Goals Cognitive deficits Barriers/Prognosis Comment -- CHL IP DIET RECOMMENDATION 11/09/2020 SLP Diet Recommendations Dysphagia 1 (Puree) solids;Nectar thick liquid Liquid Administration via Cup;Straw Medication Administration Crushed with puree Compensations Minimize environmental distractions;Slow rate;Small sips/bites Postural Changes Seated upright at 90 degrees   CHL IP OTHER RECOMMENDATIONS 11/09/2020 Recommended Consults -- Oral Care Recommendations Oral care BID Other Recommendations Prohibited food (jello, ice cream, thin soups);Remove water pitcher   CHL IP FOLLOW UP RECOMMENDATIONS 11/09/2020 Follow up Recommendations Home health SLP   CHL IP FREQUENCY AND DURATION 11/09/2020 Speech Therapy Frequency (ACUTE ONLY) min 2x/week Treatment Duration 2 weeks      CHL IP ORAL PHASE 11/09/2020 Oral Phase Impaired Oral - Pudding Teaspoon -- Oral - Pudding Cup -- Oral - Honey Teaspoon -- Oral - Honey Cup -- Oral -  Nectar Teaspoon WFL Oral - Nectar Cup WFL Oral - Nectar Straw WFL Oral - Thin Teaspoon WFL Oral - Thin Cup -- Oral - Thin Straw -- Oral - Puree WFL Oral - Mech Soft -- Oral - Regular Impaired mastication;Lingual/palatal residue Oral - Multi-Consistency -- Oral - Pill -- Oral Phase - Comment --  CHL IP PHARYNGEAL PHASE 11/09/2020 Pharyngeal Phase Impaired Pharyngeal- Pudding Teaspoon -- Pharyngeal -- Pharyngeal- Pudding Cup -- Pharyngeal -- Pharyngeal- Honey Teaspoon -- Pharyngeal -- Pharyngeal- Honey Cup -- Pharyngeal -- Pharyngeal- Nectar Teaspoon Reduced anterior laryngeal mobility Pharyngeal -- Pharyngeal- Nectar Cup Reduced anterior laryngeal mobility Pharyngeal -- Pharyngeal- Nectar Straw Reduced anterior laryngeal mobility Pharyngeal -- Pharyngeal- Thin Teaspoon Reduced anterior laryngeal mobility;Reduced epiglottic inversion;Penetration/Aspiration before swallow Pharyngeal Material enters  airway, passes BELOW cords and not ejected out despite cough attempt by patient Pharyngeal- Thin Cup -- Pharyngeal -- Pharyngeal- Thin Straw -- Pharyngeal -- Pharyngeal- Puree Reduced anterior laryngeal mobility;Pharyngeal residue - valleculae Pharyngeal -- Pharyngeal- Mechanical Soft -- Pharyngeal -- Pharyngeal- Regular Reduced anterior laryngeal mobility;Pharyngeal residue - valleculae Pharyngeal -- Pharyngeal- Multi-consistency -- Pharyngeal -- Pharyngeal- Pill -- Pharyngeal -- Pharyngeal Comment --  CHL IP CERVICAL ESOPHAGEAL PHASE 11/09/2020 Cervical Esophageal Phase WFL Pudding Teaspoon -- Pudding Cup -- Honey Teaspoon -- Honey Cup -- Nectar Teaspoon -- Nectar Cup -- Nectar Straw -- Thin Teaspoon -- Thin Cup -- Thin Straw -- Puree -- Mechanical Soft -- Regular -- Multi-consistency -- Pill -- Cervical Esophageal Comment -- Osie Bond., M.A. CCC-SLP Acute Rehabilitation Services Pager [phone] Office [phone] 11/09/2020, 9:17 AM              VAS US DUPLEX DIALYSIS ACCESS (AVF, AVG)  Result Date: 11/09/2020 DIALYSIS ACCESS Patient Name:  Christy Zhang  Date of Exam:   11/09/2020 Medical Rec #: [id number]          Accession #:    [id number] Date of Birth: [birth year]          Patient Gender: F Patient Age:   65Y Exam Location:  [facility] Procedure:      VAS US DUPLEX DIALYSIS ACCESS (AVF, AVG) Referring Phys: [id number] Armando Gang HAN --------------------------------------------------------------------------------  Reason for Exam: Malfunctioning right AV Graft, prefistulagram evaluation. Access Site: Right Upper Extremity. Access Type: Upper arm loop AVG. Limitations: body habitus and tissue properties Comparison Study: no prior Performing Technologist: Archie Patten RVS  Examination Guidelines: A complete evaluation includes B-mode imaging, spectral Doppler, color Doppler, and power Doppler as needed of all accessible portions of each vessel. Unilateral testing is considered an integral part of a  complete examination. Limited examinations for reoccurring indications may be performed as noted.  Findings:   +--------------------+----------+-----------------+--------+ AVG                 PSV (cm/s)Flow Vol (mL/min)Describe +--------------------+----------+-----------------+--------+ Native artery inflow   199            9                 +--------------------+----------+-----------------+--------+ Arterial anastomosis   429                              +--------------------+----------+-----------------+--------+ Prox graft             181                              +--------------------+----------+-----------------+--------+ Mid graft  155                              +--------------------+----------+-----------------+--------+ Distal graft           425                              +--------------------+----------+-----------------+--------+ Venous anastomosis     155                              +--------------------+----------+-----------------+--------+ Venous outflow         196                              +--------------------+----------+-----------------+--------+  Summary: No obvious evidence of thrombus/stenosis or perigraft fluid.  *See table(s) above for measurements and observations.  Diagnosing physician: Jamelle Haring Electronically signed by Jamelle Haring on 11/09/2020 at 5:15:00 PM.    --------------------------------------------------------------------------------   Final         Scheduled Meds:  amiodarone  400 mg Oral BID   Followed by   [name] ON 11/13/2020] amiodarone  200 mg Oral Daily   aspirin  81 mg Per Tube Daily   atorvastatin  40 mg Per Tube Daily   chlorhexidine  15 mL Mouth Rinse BID   Chlorhexidine Gluconate Cloth  6 each Topical Q0600   Chlorhexidine Gluconate Cloth  6 each Topical Q0600   clopidogrel  75 mg Per Tube Daily   darbepoetin (ARANESP) injection - DIALYSIS  100 mcg Intravenous Q Fri-HD   feeding  supplement (PROSource TF)  45 mL Per Tube TID   insulin aspart  3-9 Units Subcutaneous Q4H   mouth rinse  15 mL Mouth Rinse q12n4p   midodrine  5 mg Per Tube TID WC   multivitamin  1 tablet Oral QHS   nystatin   Topical BID   polyethylene glycol  17 g Per Tube Daily   sodium chloride flush  10-40 mL Intracatheter Q12H   Continuous Infusions:  sodium chloride Stopped (11/07/20 1143)   sodium chloride     sodium chloride Stopped (11/09/20 1229)    ceFAZolin (ANCEF) IV     feeding supplement (VITAL 1.5 CAL) 1,000 mL (11/08/20 1211)   heparin 1,450 Units/hr (11/10/20 0921)     LOS: 14 days    Time spent: 35 mins.More than 50% of that time was spent in counseling and/or coordination of care.      Shelly Coss, MD [facility] P7/15/2022, 1:30 PM

## 2020-11-10 NOTE — Progress Notes (Addendum)
Nutrition Follow Up  DOCUMENTATION CODES:   Not applicable  INTERVENTION:   Tube feeding via Cortrak: Change to Nepro at 70 ml/h x 14 hours nocturnally from 6pm to 8am (980 ml per day) Prosource TF 45 ml QID  Provides 1924 kcal, 123 gm protein, 712 ml free water daily  Continue Rena-vit daily.   Calorie count over the weekend.   Magic cup TID with meals, each supplement provides 290 kcal and 9 grams of protein.  NUTRITION DIAGNOSIS:   Increased nutrient needs related to acute illness as evidenced by estimated needs.  Ongoing  GOAL:   Patient will meet greater than or equal to 90% of their needs  Met via TF  MONITOR:   Vent status, Skin, TF tolerance, Weight trends, Labs, I & O's  REASON FOR ASSESSMENT:   Consult, Ventilator Enteral/tube feeding initiation and management  ASSESSMENT:   Patient with PMH significant for HTN, HLD, PVD s/p bilateral BKA, DM, ESRD on HD, and CHF. Presents this admission with volume overload and PNA with pleural effusions.  7/03- brief PEA arrest 7/05- L chest tube placed  7/09- s/p L radial artery thrombectomy, artery patch angioplasty 7/12- extubated, CRRT stopped 7/13- Cortrak placed, tip is gastric  Currently receiving Vital 1.5 at 55 ml/h with Prosource TF 45 ml TID via Cortrak to provide 2100 kcal, 122 gm protein, 1008 ml free water daily.    Patient currently in hemodialysis. Unable to speak with her at this time. S/P MBS 7/14. Diet advanced to dysphagia 1 with nectar thick liquids. Patient ate 25% of dinner 7/14 and 0% of breakfast today. Suspect intake will continue to be suboptimal d/t dysphagia and restrictive diet. She is not meeting nutrition needs to promote healing and recovery at this time. Would leave Cortrak in place and change to nocturnal TF regimen. Will order a calorie count over the weekend to determine adequacy of oral intake before removing Cortrak.   EDW: 97 kg  Admission weight: 89.7 kg  Current weight:  87.7 kg   UOP: anuric  Medications: aranesp, SS novolog, rena-vit, miralax Labs: Na 134 (L) Mg 2.8 (H) K 5.3 (H) CBG: [id number]  Diet Order:   Diet Order             DIET - DYS 1 Room service appropriate? Yes; Fluid consistency: Nectar Thick  Diet effective now                   EDUCATION NEEDS:   Not appropriate for education at this time  Skin:  Skin Assessment: Skin Integrity Issues: Skin Integrity Issues:: Other (Comment), Incisions, Stage II Stage II: coccyx Incisions: R BKA nonhealing Other: MASD- R groin  Last BM:  7/15 type 6  Height:   Ht Readings from Last 1 Encounters:  10/29/20 _0  (1.626 m)    Weight:   Wt Readings from Last 1 Encounters:  11/10/20 87.7 kg    BMI:  Body mass index is 33.19 kg/m.  Estimated Nutritional Needs:   Kcal:  2100-2300 kcal  Protein:  120-140 grams  Fluid:  1000 ml + UOP   Lucas Mallow, RD, LDN, CNSC Please refer to Amion for contact information.

## 2020-11-10 NOTE — Care Management Important Message (Signed)
Important Message  Patient Details  Name: Christy Zhang MRN: [id number] Date of Birth: 12-May-1955   Medicare Important Message Given:  Yes     [name] 11/10/2020, 2:33 PM

## 2020-11-10 NOTE — Progress Notes (Signed)
PT Cancellation Note  Patient Details Name: Christy Zhang MRN: [id number] DOB: June 19, 1955   Cancelled Treatment:    Reason Eval/Treat Not Completed: Patient at procedure or test/unavailable (Pt in HD currently.  Will reattempt at later date.)   Alvira Philips 11/10/2020, 2:27 PM  Lizbett Garciagarcia M,PT Acute Rehab Services [phone] (pager)

## 2020-11-10 NOTE — Progress Notes (Signed)
ANTICOAGULATION CONSULT NOTE - Follow Up Consult  Pharmacy Consult for heparin Indication: atrial fibrillation   Labs: Recent Labs    11/08/20 0334 11/08/20 1527 11/09/20 0410 11/09/20 1301 11/09/20 2000 11/10/20 0522  HGB 10.0*  --  9.3*  --   --  9.6*  HCT 33.4*  --  30.7*  --   --  31.0*  PLT 559*  --  602*  --   --  705*  HEPARINUNFRC 0.41  --  0.25* 0.31  --  0.36  CREATININE 2.66*   < > 2.82*  --  3.86* 4.52*   < > = values in this interval not displayed.     Assessment: 65 yo W on heparin for new afib. No anticoagulation prior to admission. Pharmacy consulted for heparin.    7/14 Heparin is infusing via L IJ triple lumen and heparin level was drawn by lab (peripheral blood draw), per RN's report. Planning for right AV graft fistulogram with possible angioplasty, thrombectomy/declot, stent placement, and TDC placement per IR 11/10/20.  HL 0.36 is therapeutic on 1400 units/hr. Will increase slightly given severe PAD. H/H, plt stable since 2 unit RBC 7/9  Goal of Therapy:  Heparin level 0.3-0.7 units/ml Monitor platelets per anticoagulation protocol: Yes    Plan:  Increase heparin drip to 1450 units/hr Check HL and CBC daily  Monitor for signs and symptoms of bleeding  Benetta Spar, PharmD, BCPS, BCCP Clinical Pharmacist  Please check AMION for all [place] phone numbers After 10:00 PM, call [facility]

## 2020-11-11 DIAGNOSIS — J189 Pneumonia, unspecified organism: Secondary | ICD-10-CM | POA: Diagnosis not present

## 2020-11-11 DIAGNOSIS — I4819 Other persistent atrial fibrillation: Secondary | ICD-10-CM | POA: Diagnosis not present

## 2020-11-11 LAB — GLUCOSE, CAPILLARY
Glucose-Capillary: 240 mg/dL — ABNORMAL HIGH (ref 70–99)
Glucose-Capillary: 193 mg/dL — ABNORMAL HIGH (ref 70–99)
Glucose-Capillary: 184 mg/dL — ABNORMAL HIGH (ref 70–99)
Glucose-Capillary: 154 mg/dL — ABNORMAL HIGH (ref 70–99)
Glucose-Capillary: 95 mg/dL (ref 70–99)
Glucose-Capillary: 114 mg/dL — ABNORMAL HIGH (ref 70–99)

## 2020-11-11 LAB — RENAL FUNCTION PANEL
Albumin: 1.9 g/dL — ABNORMAL LOW (ref 3.5–5.0)
Albumin: 1.9 g/dL — ABNORMAL LOW (ref 3.5–5.0)
Anion gap: 8 (ref 5–15)
Anion gap: 11 (ref 5–15)
BUN: 40 mg/dL — ABNORMAL HIGH (ref 8–23)
BUN: 50 mg/dL — ABNORMAL HIGH (ref 8–23)
CO2: 26 mmol/L (ref 22–32)
CO2: 22 mmol/L (ref 22–32)
Calcium: 8.6 mg/dL — ABNORMAL LOW (ref 8.9–10.3)
Calcium: 8.6 mg/dL — ABNORMAL LOW (ref 8.9–10.3)
Chloride: 99 mmol/L (ref 98–111)
Chloride: 102 mmol/L (ref 98–111)
Creatinine, Ser: 4.06 mg/dL — ABNORMAL HIGH (ref 0.44–1.00)
Creatinine, Ser: 4.95 mg/dL — ABNORMAL HIGH (ref 0.44–1.00)
GFR, Estimated: 12 mL/min — ABNORMAL LOW (ref >60)
GFR, Estimated: 9 mL/min — ABNORMAL LOW (ref >60)
Glucose, Bld: 195 mg/dL — ABNORMAL HIGH (ref 70–99)
Glucose, Bld: 130 mg/dL — ABNORMAL HIGH (ref 70–99)
Phosphorus: 3.2 mg/dL (ref 2.5–4.6)
Phosphorus: 4.2 mg/dL (ref 2.5–4.6)
Potassium: 4.3 mmol/L (ref 3.5–5.1)
Potassium: 4.9 mmol/L (ref 3.5–5.1)
Sodium: 133 mmol/L — ABNORMAL LOW (ref 135–145)
Sodium: 135 mmol/L (ref 135–145)

## 2020-11-11 LAB — CBC
HCT: 27.1 % — ABNORMAL LOW (ref 36.0–46.0)
Hemoglobin: 8.6 g/dL — ABNORMAL LOW (ref 12.0–15.0)
MCH: 24.6 pg — ABNORMAL LOW (ref 26.0–34.0)
MCHC: 31.7 g/dL (ref 30.0–36.0)
MCV: 77.4 fL — ABNORMAL LOW (ref 80.0–100.0)
Platelets: 686 10*3/uL — ABNORMAL HIGH (ref 150–400)
RBC: 3.5 MIL/uL — ABNORMAL LOW (ref 3.87–5.11)
RDW: 21.4 % — ABNORMAL HIGH (ref 11.5–15.5)
WBC: 18.5 10*3/uL — ABNORMAL HIGH (ref 4.0–10.5)
nRBC: 0 % (ref 0.0–0.2)

## 2020-11-11 LAB — MAGNESIUM: Magnesium: 2.6 mg/dL — ABNORMAL HIGH (ref 1.7–2.4)

## 2020-11-11 LAB — HEPARIN LEVEL (UNFRACTIONATED): Heparin Unfractionated: 0.31 IU/mL (ref 0.30–0.70)

## 2020-11-11 NOTE — Plan of Care (Signed)
  Problem: Nutrition: Goal: Adequate nutrition will be maintained Outcome: Progressing   Problem: Elimination: Goal: Will not experience complications related to bowel motility Outcome: Progressing   Problem: Pain Managment: Goal: General experience of comfort will improve Outcome: Progressing   Problem: Safety: Goal: Ability to remain free from injury will improve Outcome: Progressing   

## 2020-11-11 NOTE — Progress Notes (Signed)
[facility] NEPHROLOGY PROGRESS NOTE  Assessment/ Plan: Pt is a 65 y.o. yo female  with history of HTN, HLD, peripheral vascular disease status post bilateral BKA, DM, anemia, ESRD on HD MWF at [facility] presented with cough, shortness of breath and generalized weakness, seen as a consultation for the management of dialysis.  Dialysis Orders from previous admission:  [facility] - MWF 4 hrs 400/500 97 kg 2.0K/2.5 Ca 16g needles -Heparin 1000 units initial bolus-heparin pump 1000 units/hr stop 1 hour prior to end of tx  -Epogen 14000 units IV TIW -Hectorol 1.5 mcg IV TIW  # ESRD MWF schedule.  S/p CRRT, stopped 7/12.  Difficulty using her fistula NOS Wed but worked ok Friday. Duplex of AVF ok.  Plan next HD Monday.  D/C L IJ temp cath - defer to primary to order as she may be relying on the trialysis line for access.   #Septic shock due to left lung pneumonia: resolved s/p course of abx  #Thrombosed left radial artery/left hand ischemia -2/2 previous a line and pressors -s/p left radial artery thrombectomy and angio 7/9 w/ VVS  #Possible inferior STEMI -not a candidate for intervention currently. Per cardiology  #A. fib with RVR: Currently on amiodarone and heparin.  Appears plans for conversion to eliquis - ok to do from nephrology perspective. Cardiology is following.   # Anemia: Hemoglobin 8.6  today.  Continue to monitor.  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  She feels well today, no new issues.   Objective Vital signs in last 24 hours: Vitals:   11/11/20 0700 11/11/20 0800 11/11/20 0900 11/11/20 1000  BP: (!) 136/49 121/65 (!) 138/93 (!) 137/38  Pulse: 82 87 87 84   Resp: (!) 27 (!) 24 (!) 26 19  Temp:      TempSrc:      SpO2: 100% 100% 100% 100%  Weight:      Height:       Weight change: -0.1 kg  Intake/Output Summary (Last 24 hours) at 11/11/2020 1348 Last data filed at 11/11/2020 1000 Gross per 24 hour  Intake 1197.51 ml  Output 366 ml  Net 831.51 ml      Labs: Basic Metabolic Panel: Recent Labs  Lab 11/10/20 0522 11/10/20 2015 11/11/20 0449  NA 134* 135 133*  K 5.3* 4.3 4.3  CL 100 99 99  CO2 19* 26 26  GLUCOSE 72 185* 195*  BUN 51* 31* 40*  CREATININE 4.52* 3.40* 4.06*  CALCIUM 9.1 8.5* 8.6*  PHOS 4.2 3.3 3.2    Liver Function Tests: Recent Labs  Lab 11/10/20 0522 11/10/20 2015 11/11/20 0449  ALBUMIN 2.1* 1.8* 1.9*    No results for input(s): LIPASE, AMYLASE in the last 168 hours. No results for input(s): AMMONIA in the last 168 hours. CBC: Recent Labs  Lab 11/07/20 0455 11/08/20 0334 11/09/20 0410 11/10/20 0522 11/11/20 0450  WBC 25.9* 27.8* 21.8* 21.8* 18.5*  HGB 10.0* 10.0* 9.3* 9.6* 8.6*  HCT 33.9* 33.4* 30.7* 31.0* 27.1*  MCV 81.5 81.3 79.3* 78.5* 77.4*  PLT 455* 559* 602* 705* 686*    Cardiac Enzymes: No results for input(s): CKTOTAL, CKMB, CKMBINDEX, TROPONINI in the last 168 hours. CBG: Recent Labs  Lab 11/10/20 2007 11/11/20 0102 11/11/20 0454 11/11/20 0754 11/11/20 1145  GLUCAP 171* 240* 193* 184* 154*     Iron Studies: No results for input(s): IRON, TIBC, TRANSFERRIN, FERRITIN in the last 72 hours. Studies/Results: VAS US DUPLEX DIALYSIS ACCESS (AVF, AVG)  Result Date: 11/09/2020 DIALYSIS ACCESS Patient Name:  Christy Zhang  Date of Exam:   11/09/2020 Medical Rec #: [id number]          Accession #:    [id number] Date of Birth: [birth year]          Patient Gender: F Patient Age:   65Y Exam Location:  [facility] Procedure:      VAS US DUPLEX DIALYSIS ACCESS (AVF, AVG) Referring Phys: [id number] Armando Gang HAN  --------------------------------------------------------------------------------  Reason for Exam: Malfunctioning right AV Graft, prefistulagram evaluation. Access Site: Right Upper Extremity. Access Type: Upper arm loop AVG. Limitations: body habitus and tissue properties Comparison Study: no prior Performing Technologist: Archie Patten RVS  Examination Guidelines: A complete evaluation includes B-mode imaging, spectral Doppler, color Doppler, and power Doppler as needed of all accessible portions of each vessel. Unilateral testing is considered an integral part of a complete examination. Limited examinations for reoccurring indications may be performed as noted.  Findings:   +--------------------+----------+-----------------+--------+ AVG                 PSV (cm/s)Flow Vol (mL/min)Describe +--------------------+----------+-----------------+--------+ Native artery inflow   199            9                 +--------------------+----------+-----------------+--------+ Arterial anastomosis   429                              +--------------------+----------+-----------------+--------+ Prox graft             181                              +--------------------+----------+-----------------+--------+ Mid graft              155                              +--------------------+----------+-----------------+--------+ Distal graft           425                              +--------------------+----------+-----------------+--------+ Venous anastomosis     155                              +--------------------+----------+-----------------+--------+ Venous outflow         196                              +--------------------+----------+-----------------+--------+  Summary: No obvious evidence of thrombus/stenosis or perigraft fluid.  *See table(s) above for measurements and observations.  Diagnosing physician: Jamelle Haring Electronically signed by Jamelle Haring on 11/09/2020 at  5:15:00 PM.    --------------------------------------------------------------------------------   Final     Medications: Infusions:  sodium chloride Stopped (11/07/20 1143)   heparin 1,500 Units/hr (11/11/20 1336)    Scheduled Medications:  amiodarone  400 mg Oral BID   Followed by   [  START ON 11/13/2020] amiodarone  200 mg Oral Daily   aspirin  81 mg Per Tube Daily   atorvastatin  40 mg Per Tube Daily   chlorhexidine  15 mL Mouth Rinse BID   Chlorhexidine Gluconate Cloth  6 each Topical Q0600   Chlorhexidine Gluconate Cloth  6 each Topical Q0600   clopidogrel  75 mg Per Tube Daily   darbepoetin (ARANESP) injection - DIALYSIS  100 mcg Intravenous Q Fri-HD   feeding supplement (NEPRO CARB STEADY)  1,000 mL Per Tube Q24H   feeding supplement (PROSource TF)  45 mL Per Tube QID   insulin aspart  3-9 Units Subcutaneous Q4H   mouth rinse  15 mL Mouth Rinse q12n4p   midodrine  5 mg Per Tube TID WC   multivitamin  1 tablet Oral QHS   nystatin   Topical BID   polyethylene glycol  17 g Per Tube Daily   sodium chloride flush  10-40 mL Intracatheter Q12H    have reviewed scheduled and prn medications.  Physical Exam: General: Chronically ill appearing, but in no distress Heart:RRR, s1s2 nl Lungs: , b/l chest expansion Abdomen:soft,  non-distended Extremities: Bilateral BKA, no stump/thigh edema, R stump with scab no erythema or drainage; left hand with several necrotic digits and hand with patchy duskiness that is stable Neuro: awake, following commands Dialysis Access: Left IJ temporary HD catheter for CRRT.  AV fistula has + thrill and bruit Justin Mend 11/11/2020,1:48 PM  LOS: 15 days

## 2020-11-11 NOTE — Progress Notes (Signed)
PROGRESS NOTE    Christy Zhang  [id number] DOB: [birth year] DOA: 10/27/2020 PCP: Claretta Fraise, MD   Chief Complain: Altered mental status  Brief Narrative: Patient is a 57 old female with history of bilateral BKA, ESRD on dialysis on Monday/Wednesday/Friday, type 2 diabetes, hypertension, peripheral vascular disease, hyperlipidemia who presented to the emergency department for the evaluation of altered mental status.  She was complaining of 2-week history of intermittent shortness of breath, dry cough, fatigue, weakness.  On presentation she was hypertensive.  Lab work showed leukocytosis, hyponatremia, hyperglycemia, creatinine of 6.4, lactic acid of 2.9.  Chest x-ray showed acute consolidation/pneumonia with fullness.  Patient was started on Rocephin antibiotics for septic shock, started on IV fluids.  She was intubated for airway protection, put on mechanical ventilation.  She also was started on CRRT on 7/3 in the setting of septic shock from pneumonia causing acute respiratory failure with hypoxia and AKI.  Hospital course remarkable for new onset A. fib, cardiology were following. Overall, she is improving.  Still on tube feeding  Assessment & Plan:   Principal Problem:   CAP (community acquired pneumonia) Active Problems:   Anemia of chronic disease   Essential (primary) hypertension   Hyperglycemia due to diabetes mellitus (HCC)   Leucocytosis   Thrombocytosis   ESRD on hemodialysis (HCC)   PVD (peripheral vascular disease)/ S/p BKA   Altered mental status   Acute respiratory failure with hypoxemia (HCC)   Lactic acidosis   Hypoalbuminemia   Hyponatremia   Hyperlipidemia   Cardiac arrest (HCC)   Paroxysmal atrial fibrillation ([place])  Septic shock: Resolving.  Blood pressure better.  Pressors were weaned  on 7/11.  Also on midodrine.  Acute respiratory failure with hypoxia/hypercarbia: Secondary to severe community-acquired pneumonia.  Was intubated and on mechanical  ventilation.  Now extubated.  PCCM were following.  She finished a course of antibiotics.  Currently on room air  ESRD on hemodialysis: Nephrology following.  CRRT stopped on 7/12.  Underwent dialysis.  Previous AVG was not usable for hemodialysis so IR consulted for fistulogram.  Ultrasound duplex of the graft did not show any thrombosis or restenosis, graft again being used.  She also had a temporary dialysis catheter on the left neck  Parapneumonic effusion: Drained, chest tube has been taken out  New onset A. fib/flutter: Currently in normal sinus rhythm.  On oral amiodarone.  Plan is to convert the IV heparin to Eliquis at some point/after finishing procedures if needed  Elevated troponin: Most likely secondary to demand ischemia in the setting of sepsis.  Troponins were flat trend.  2D echo showed normal left ventricular function.  No further work-up recommended by cardiology.  Left radial artery thrombotic occlusion: Status post thrombectomy, distal ischemia.  Continue local wound care.  She may need amputation at some point, vascular surgery has been following and will address that  Hyperlipidemia: Continue statin  Constipation: Continue bowel regimen  Leukocytosis/thrombocytosis: On reviewing her previous records, she always has chronic leukocytosis along with thrombocytosis.  Could not rule out hematological pathology.  I sent a message to Dr. Germain Osgood  outpatient follow-up  Normocytic anemia: Currently hemoglobin stable.  Continue to monitor  Diabetes type 2: Hospital course remarkable for hypoglycemia.  Monitor blood sugars.  On tube feeding through core track.  Weakness/deconditioning: PT/OT consulted.  Recommended home health PT on discharge  Dysphagia: Speech therapy following.  Recommended dysphagia 1 diet.  We are checking with dietitian/speech therapy about taking the tube out.  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  Goals of care: Multiple comorbidities.  Palliative care consulted and following and recommended limited code  Nutrition Problem: Increased nutrient needs Etiology: acute illness      DVT prophylaxis: IV heparin Code Status: Full Family Communication: Called and discussed with daughter on phone on 11/10/20 Status is: Inpatient  Remains inpatient appropriate because:Inpatient level of care appropriate due to severity of illness  Dispo: The patient is from: Home              Anticipated d/c is to: Home              Patient currently is not medically stable to d/c.   Difficult to place patient No    Consultants: PCCM, cardiology, nephrology  Procedures: CRRT, intubation  Important events:  Admitted to hospital on 7/1 7/1 cefepime >  7/1 flagyl x1 7/1 vancomycin>  Admitted to ICU on 10/29/20 for CRRT 7/3 - brief PEA with bearing down while agitated. 7/3 ETT 7/3 left radial arterial line 7/3 L IJ HD cath 7/5 L pigtail CT placed for L pleural effusion 7/6 CT output -550 cc 7/8 A fib, ST elevation noted per EKG, Concern for ischemia, cards consulted 7/12 extubated  Antimicrobials:  Anti-infectives (From admission, onward)    Start     Dose/Rate Route Frequency Ordered Stop   11/10/20 0600  ceFAZolin (ANCEF) IVPB 2g/100 mL premix        2 g 200 mL/hr over 30 Minutes Intravenous To Radiology 11/09/20 1453 11/11/20 0600   11/03/20 2200  vancomycin (VANCOCIN) IVPB 1000 mg/200 mL premix  Status:  Discontinued        1,000 mg 200 mL/hr over 60 Minutes Intravenous Every 24 hours 11/02/20 2017 11/06/20 0750   11/02/20 2115  vancomycin (VANCOREADY) IVPB 1250 mg/250 mL        1,250 mg 166.7 mL/hr over 90 Minutes Intravenous  Once 11/02/20 2017 11/02/20 2245   11/02/20 1930  meropenem (MERREM) 1 g in sodium chloride 0.9 % 100 mL IVPB  Status:  Discontinued        1 g 200 mL/hr over 30  Minutes Intravenous Every 8 hours 11/02/20 1841 11/06/20 0750   10/30/20 1200  vancomycin (VANCOCIN) IVPB 750 mg/150 ml premix  Status:  Discontinued        750 mg 150 mL/hr over 60 Minutes Intravenous Every M-W-F (Hemodialysis) 10/28/20 0521 10/28/20 1311   10/30/20 1200  vancomycin (VANCOCIN) IVPB 1000 mg/200 mL premix  Status:  Discontinued        1,000 mg 200 mL/hr over 60 Minutes Intravenous Every M-W-F (Hemodialysis) 10/28/20 1311 10/29/20 0639   10/29/20 1800  vancomycin (VANCOCIN) IVPB 1000 mg/200 mL premix  Status:  Discontinued        1,000 mg 200 mL/hr over 60 Minutes Intravenous Every 24 hours 10/29/20 0639 10/31/20 1044   10/29/20 1000  ceFEPIme (MAXIPIME) 2 g in sodium chloride 0.9 % 100 mL IVPB  Status:  Discontinued        2 g 200 mL/hr over 30 Minutes Intravenous Every 12 hours 10/29/20 0637 11/02/20 1718   10/28/20 2000  ceFEPIme (MAXIPIME) 1 g in sodium chloride 0.9 % 100 mL IVPB  Status:  Discontinued        1 g 200 mL/hr over 30 Minutes Intravenous Every 24 hours 10/27/20 2109 10/29/20 [id number]  10/27/20 2100  vancomycin (VANCOCIN) IVPB 1000 mg/200 mL premix        1,000 mg 200 mL/hr over 60 Minutes Intravenous  Once 10/27/20 2057 10/28/20 0008   10/27/20 1900  ceFEPIme (MAXIPIME) 2 g in sodium chloride 0.9 % 100 mL IVPB        2 g 200 mL/hr over 30 Minutes Intravenous  Once 10/27/20 1848 10/27/20 2128   10/27/20 1900  metroNIDAZOLE (FLAGYL) IVPB 500 mg        500 mg 100 mL/hr over 60 Minutes Intravenous  Once 10/27/20 1848 10/27/20 2128   10/27/20 1900  vancomycin (VANCOCIN) IVPB 1000 mg/200 mL premix        1,000 mg 200 mL/hr over 60 Minutes Intravenous  Once 10/27/20 1848 10/27/20 2046       Subjective:   Patient seen and examined at the bedside this morning.  Hemodynamically stable.  Comfortable.  On room air.  Denies any complaints today.  Looks more coherent, communicative, alert and awake  Objective: Vitals:   11/11/20 0700 11/11/20 0800 11/11/20 0900  11/11/20 1000  BP: (!) 136/49 121/65 (!) 138/93 (!) 137/38  Pulse: 82 87 87 84  Resp: (!) 27 (!) 24 (!) 26 19  Temp:      TempSrc:      SpO2: 100% 100% 100% 100%  Weight:      Height:        Intake/Output Summary (Last 24 hours) at 11/11/2020 1240 Last data filed at 11/11/2020 1000 Gross per 24 hour  Intake 1495.39 ml  Output 366 ml  Net 1129.39 ml   Filed Weights   11/10/20 0500 11/10/20 1325 11/11/20 0500  Weight: 87.8 kg 87.7 kg 90.5 kg    Examination:   General exam: Overall comfortable, obese, significantly deconditioned, chronically ill looking,  HEENT: Feeding tube , temporary dialysis catheter on the left neck Respiratory system:  no wheezes or crackles  Cardiovascular system: S1 & S2 heard, RRR.  Gastrointestinal system: Abdomen is nondistended, soft and nontender. Central nervous system: Alert and oriented Extremities: Bilateral BKA, gangrenous left hand, AV graft on the right arm Skin: No rashes, no ulcers,no icterus      Data Reviewed: I have personally reviewed following labs and imaging studies  CBC: Recent Labs  Lab 11/07/20 0455 11/08/20 0334 11/09/20 0410 11/10/20 0522 11/11/20 0450  WBC 25.9* 27.8* 21.8* 21.8* 18.5*  HGB 10.0* 10.0* 9.3* 9.6* 8.6*  HCT 33.9* 33.4* 30.7* 31.0* 27.1*  MCV 81.5 81.3 79.3* 78.5* 77.4*  PLT 455* 559* 602* 705* [id number]*   Basic Metabolic Panel: Recent Labs  Lab 11/07/20 0455 11/07/20 1620 11/08/20 0334 11/08/20 1527 11/09/20 0410 11/09/20 2000 11/10/20 0522 11/10/20 2015 11/11/20 0449 11/11/20 0450  NA 136   < > 134*   < > 133* 135 134* 135 133*  --   K 3.9   < > 3.9   < > 4.2 4.6 5.3* 4.3 4.3  --   CL 103   < > 101   < > 98 98 100 99 99  --   CO2 25   < > 24   < > 26 23 19* 26 26  --   GLUCOSE 70   < > 82   < > 208* 136* 72 185* 195*  --   BUN 12   < > 24*   < > 24* 41* 51* 31* 40*  --   CREATININE 1.29*   < > 2.66*   < > 2.82* 3.86*  4.52* 3.40* 4.06*  --   CALCIUM 9.0   < > 9.3   < > 8.6* 8.7* 9.1 8.5*  8.6*  --   MG 2.8*  --  3.4*  --  2.5*  --  2.8*  --   --  2.6*  PHOS 2.3*   < > 4.6   < > 2.8 3.4 4.2 3.3 3.2  --    < > = values in this interval not displayed.   GFR: Estimated Creatinine Clearance: 15.4 mL/min (A) (by C-G formula based on SCr of 4.06 mg/dL (H)). Liver Function Tests: Recent Labs  Lab 11/09/20 0410 11/09/20 2000 11/10/20 0522 11/10/20 2015 11/11/20 0449  ALBUMIN 2.1* 2.0* 2.1* 1.8* 1.9*   No results for input(s): LIPASE, AMYLASE in the last 168 hours. No results for input(s): AMMONIA in the last 168 hours. Coagulation Profile: No results for input(s): INR, PROTIME in the last 168 hours. Cardiac Enzymes: No results for input(s): CKTOTAL, CKMB, CKMBINDEX, TROPONINI in the last 168 hours. BNP (last 3 results) No results for input(s): PROBNP in the last 8760 hours. HbA1C: No results for input(s): HGBA1C in the last 72 hours. CBG: Recent Labs  Lab 11/10/20 2007 11/11/20 0102 11/11/20 0454 11/11/20 0754 11/11/20 1145  GLUCAP 171* 240* 193* 184* 154*   Lipid Profile: No results for input(s): CHOL, HDL, LDLCALC, TRIG, CHOLHDL, LDLDIRECT in the last 72 hours. Thyroid Function Tests: No results for input(s): TSH, T4TOTAL, FREET4, T3FREE, THYROIDAB in the last 72 hours. Anemia Panel: No results for input(s): VITAMINB12, FOLATE, FERRITIN, TIBC, IRON, RETICCTPCT in the last 72 hours. Sepsis Labs: No results for input(s): PROCALCITON, LATICACIDVEN in the last 168 hours.  Recent Results (from the past 240 hour(s))  Culture, Respiratory w Gram Stain     Status: None   Collection Time: 11/02/20  8:40 AM   Specimen: Tracheal Aspirate; Respiratory  Result Value Ref Range Status   Specimen Description TRACHEAL ASPIRATE  Final   Special Requests NONE  Final   Gram Stain   Final    FEW WBC PRESENT, PREDOMINANTLY PMN NO ORGANISMS SEEN    Culture   Final    RARE Normal respiratory flora-no Staph aureus or Pseudomonas seen Performed at [facility],  [address] [place], [place] [address]    Report Status 11/04/2020 FINAL  Final  Culture, blood (routine x 2)     Status: None   Collection Time: 11/02/20  7:10 PM   Specimen: BLOOD LEFT HAND  Result Value Ref Range Status   Specimen Description BLOOD LEFT HAND  Final   Special Requests   Final    BOTTLES DRAWN AEROBIC ONLY Blood Culture results may not be optimal due to an inadequate volume of blood received in culture bottles   Culture   Final    NO GROWTH 5 DAYS Performed at [facility], [place] [address], [place], [place] [address]    Report Status 11/07/2020 FINAL  Final  Culture, blood (routine x 2)     Status: None   Collection Time: 11/02/20  7:10 PM   Specimen: BLOOD LEFT HAND  Result Value Ref Range Status   Specimen Description BLOOD LEFT HAND  Final   Special Requests   Final    BOTTLES DRAWN AEROBIC ONLY Blood Culture results may not be optimal due to an inadequate volume of blood received in culture bottles   Culture   Final    NO GROWTH 5 DAYS Performed at [facility], [place] [address], [place], [place] [address]    Report Status 11/07/2020 FINAL  Final         Radiology Studies: VAS US DUPLEX DIALYSIS ACCESS (AVF, AVG)  Result Date: 11/09/2020 DIALYSIS ACCESS Patient Name:  KAYCEE YIM  Date of Exam:   11/09/2020 Medical Rec #: [id number]          Accession #:    [id number] Date of Birth: 1956-03-01          Patient Gender: F Patient Age:   65Y Exam Location:  [facility] Procedure:      VAS US DUPLEX DIALYSIS ACCESS (AVF, AVG) Referring Phys: [id number] Armando Gang HAN --------------------------------------------------------------------------------  Reason for Exam: Malfunctioning right AV Graft, prefistulagram evaluation. Access Site: Right Upper Extremity. Access Type: Upper arm loop AVG. Limitations: body habitus and tissue properties Comparison Study: no prior Performing Technologist: Archie Patten RVS  Examination Guidelines: A complete  evaluation includes B-mode imaging, spectral Doppler, color Doppler, and power Doppler as needed of all accessible portions of each vessel. Unilateral testing is considered an integral part of a complete examination. Limited examinations for reoccurring indications may be performed as noted.  Findings:   +--------------------+----------+-----------------+--------+ AVG                 PSV (cm/s)Flow Vol (mL/min)Describe +--------------------+----------+-----------------+--------+ Native artery inflow   199            9                 +--------------------+----------+-----------------+--------+ Arterial anastomosis   429                              +--------------------+----------+-----------------+--------+ Prox graft             181                              +--------------------+----------+-----------------+--------+ Mid graft              155                              +--------------------+----------+-----------------+--------+ Distal graft           425                              +--------------------+----------+-----------------+--------+ Venous anastomosis     155                              +--------------------+----------+-----------------+--------+ Venous outflow         196                              +--------------------+----------+-----------------+--------+  Summary: No obvious evidence of thrombus/stenosis or perigraft fluid.  *See table(s) above for measurements and observations.  Diagnosing physician: Jamelle Haring Electronically signed by Jamelle Haring on 11/09/2020 at 5:15:00 PM.    --------------------------------------------------------------------------------   Final         Scheduled Meds:  amiodarone  400 mg Oral BID   Followed by   [name] ON 11/13/2020] amiodarone  200 mg Oral Daily   aspirin  81 mg Per Tube Daily   atorvastatin  40 mg Per  Tube Daily   chlorhexidine  15 mL Mouth Rinse BID   Chlorhexidine Gluconate Cloth  6 each  Topical Q0600   Chlorhexidine Gluconate Cloth  6 each Topical Q0600   clopidogrel  75 mg Per Tube Daily   darbepoetin (ARANESP) injection - DIALYSIS  100 mcg Intravenous Q Fri-HD   feeding supplement (NEPRO CARB STEADY)  1,000 mL Per Tube Q24H   feeding supplement (PROSource TF)  45 mL Per Tube QID   insulin aspart  3-9 Units Subcutaneous Q4H   mouth rinse  15 mL Mouth Rinse q12n4p   midodrine  5 mg Per Tube TID WC   multivitamin  1 tablet Oral QHS   nystatin   Topical BID   polyethylene glycol  17 g Per Tube Daily   sodium chloride flush  10-40 mL Intracatheter Q12H   Continuous Infusions:  sodium chloride Stopped (11/07/20 1143)   heparin 1,500 Units/hr (11/11/20 0950)     LOS: 15 days    Time spent: 35 mins.More than 50% of that time was spent in counseling and/or coordination of care.      Shelly Coss, MD [facility] P7/16/2022, 12:40 PM

## 2020-11-11 NOTE — Progress Notes (Signed)
ANTICOAGULATION CONSULT NOTE - Follow Up Consult  Pharmacy Consult for heparin Indication: atrial fibrillation   Labs: Recent Labs    11/09/20 0410 11/09/20 1301 11/09/20 2000 11/10/20 0522 11/10/20 2015 11/11/20 0449 11/11/20 0450  HGB 9.3*  --   --  9.6*  --   --  8.6*  HCT 30.7*  --   --  31.0*  --   --  27.1*  PLT 602*  --   --  705*  --   --  686*  HEPARINUNFRC 0.25* 0.31  --  0.36  --  0.31  --   CREATININE 2.82*  --    < > 4.52* 3.40* 4.06*  --    < > = values in this interval not displayed.     Assessment: 65 yo W on heparin for new afib. No anticoagulation prior to admission. Pharmacy consulted for heparin.    7/14 Heparin is infusing via L IJ triple lumen and heparin level was drawn by lab (peripheral blood draw), per RN's report. Planning for right AV graft fistulogram with possible angioplasty, thrombectomy/declot, stent placement, and TDC placement per IR 11/10/20.  Heparin level 0.31 (will slightly increase)  Goal of Therapy:  Heparin level 0.3-0.7 units/ml Monitor platelets per anticoagulation protocol: Yes    Plan:  Heparin to 1500 units / hr Check HL and CBC daily  Monitor for signs and symptoms of bleeding  Thank you Anette Guarneri, PharmD  Please check AMION for all [place] phone numbers After 10:00 PM, call [place]

## 2020-11-12 DIAGNOSIS — J189 Pneumonia, unspecified organism: Secondary | ICD-10-CM | POA: Diagnosis not present

## 2020-11-12 DIAGNOSIS — I4819 Other persistent atrial fibrillation: Secondary | ICD-10-CM | POA: Diagnosis not present

## 2020-11-12 LAB — RENAL FUNCTION PANEL
Albumin: 1.9 g/dL — ABNORMAL LOW (ref 3.5–5.0)
Albumin: 1.2 g/dL — ABNORMAL LOW (ref 3.5–5.0)
Anion gap: 11 (ref 5–15)
Anion gap: 10 (ref 5–15)
BUN: 66 mg/dL — ABNORMAL HIGH (ref 8–23)
BUN: 54 mg/dL — ABNORMAL HIGH (ref 8–23)
CO2: 24 mmol/L (ref 22–32)
CO2: 15 mmol/L — ABNORMAL LOW (ref 22–32)
Calcium: 8.8 mg/dL — ABNORMAL LOW (ref 8.9–10.3)
Calcium: 5.9 mg/dL — CL (ref 8.9–10.3)
Chloride: 97 mmol/L — ABNORMAL LOW (ref 98–111)
Chloride: 112 mmol/L — ABNORMAL HIGH (ref 98–111)
Creatinine, Ser: 5.76 mg/dL — ABNORMAL HIGH (ref 0.44–1.00)
Creatinine, Ser: 4.27 mg/dL — ABNORMAL HIGH (ref 0.44–1.00)
GFR, Estimated: 8 mL/min — ABNORMAL LOW (ref >60)
GFR, Estimated: 11 mL/min — ABNORMAL LOW (ref >60)
Glucose, Bld: 183 mg/dL — ABNORMAL HIGH (ref 70–99)
Glucose, Bld: 128 mg/dL — ABNORMAL HIGH (ref 70–99)
Phosphorus: 5.3 mg/dL — ABNORMAL HIGH (ref 2.5–4.6)
Phosphorus: 3.4 mg/dL (ref 2.5–4.6)
Potassium: 5.1 mmol/L (ref 3.5–5.1)
Potassium: 3.5 mmol/L (ref 3.5–5.1)
Sodium: 132 mmol/L — ABNORMAL LOW (ref 135–145)
Sodium: 137 mmol/L (ref 135–145)

## 2020-11-12 LAB — GLUCOSE, CAPILLARY
Glucose-Capillary: 173 mg/dL — ABNORMAL HIGH (ref 70–99)
Glucose-Capillary: 167 mg/dL — ABNORMAL HIGH (ref 70–99)
Glucose-Capillary: 206 mg/dL — ABNORMAL HIGH (ref 70–99)
Glucose-Capillary: 104 mg/dL — ABNORMAL HIGH (ref 70–99)
Glucose-Capillary: 168 mg/dL — ABNORMAL HIGH (ref 70–99)
Glucose-Capillary: 174 mg/dL — ABNORMAL HIGH (ref 70–99)

## 2020-11-12 LAB — MAGNESIUM: Magnesium: 2.9 mg/dL — ABNORMAL HIGH (ref 1.7–2.4)

## 2020-11-12 LAB — CBC
HCT: 27.7 % — ABNORMAL LOW (ref 36.0–46.0)
Hemoglobin: 8.7 g/dL — ABNORMAL LOW (ref 12.0–15.0)
MCH: 24.2 pg — ABNORMAL LOW (ref 26.0–34.0)
MCHC: 31.4 g/dL (ref 30.0–36.0)
MCV: 77.2 fL — ABNORMAL LOW (ref 80.0–100.0)
Platelets: 758 10*3/uL — ABNORMAL HIGH (ref 150–400)
RBC: 3.59 MIL/uL — ABNORMAL LOW (ref 3.87–5.11)
RDW: 21 % — ABNORMAL HIGH (ref 11.5–15.5)
WBC: 19.1 10*3/uL — ABNORMAL HIGH (ref 4.0–10.5)
nRBC: 0 % (ref 0.0–0.2)

## 2020-11-12 LAB — HEPARIN LEVEL (UNFRACTIONATED): Heparin Unfractionated: 0.36 IU/mL (ref 0.30–0.70)

## 2020-11-12 NOTE — Progress Notes (Signed)
ANTICOAGULATION CONSULT NOTE - Follow Up Consult  Pharmacy Consult for heparin Indication: atrial fibrillation   Labs: Recent Labs    11/10/20 0522 11/10/20 2015 11/11/20 0449 11/11/20 0450 11/11/20 1632 11/12/20 0445  HGB 9.6*  --   --  8.6*  --  8.7*  HCT 31.0*  --   --  27.1*  --  27.7*  PLT 705*  --   --  686*  --  758*  HEPARINUNFRC 0.36  --  0.31  --   --  0.36  CREATININE 4.52*   < > 4.06*  --  4.95* 5.76*   < > = values in this interval not displayed.     Assessment: 65 yo W on heparin for new afib. No anticoagulation prior to admission. Pharmacy consulted for heparin.    Possible procedures needed w/ recent left radial artery thrombotic occlusion and also with recent HD access concerns. Holding on roal anticoagulation. -heparin level at goal, hg= 8.7   Goal of Therapy:  Heparin level 0.3-0.7 units/ml Monitor platelets per anticoagulation protocol: Yes    Plan:  Continue heparin at 1500 units / hr Check HL and CBC daily   Christy Zhang, PharmD Clinical Pharmacist **Pharmacist phone directory can now be found on [email or web address] ([email or web address]).  Listed under [place].

## 2020-11-12 NOTE — Progress Notes (Signed)
PROGRESS NOTE    Christy Zhang  [name] DOB: [birth year] DOA: 10/27/2020 PCP: Claretta Fraise, MD   Chief Complain: Altered mental status  Brief Narrative: Patient is a 6 old female with history of bilateral BKA, ESRD on dialysis on Monday/Wednesday/Friday, type 2 diabetes, hypertension, peripheral vascular disease, hyperlipidemia who presented to the emergency department for the evaluation of altered mental status.  She was complaining of 2-week history of intermittent shortness of breath, dry cough, fatigue, weakness.  On presentation she was hypertensive.  Lab work showed leukocytosis, hyponatremia, hyperglycemia, creatinine of 6.4, lactic acid of 2.9.  Chest x-ray showed acute consolidation/pneumonia with fullness.  Patient was started on Rocephin antibiotics for septic shock, started on IV fluids.  She was intubated for airway protection, put on mechanical ventilation.  She also was started on CRRT on 7/3 in the setting of septic shock from pneumonia causing acute respiratory failure with hypoxia and AKI.  Hospital course remarkable for new onset A. fib, cardiology were following. Overall, she is improving.  Still on tube feeding but also on dysphagia 2 diet.  Assessment & Plan:   Principal Problem:   CAP (community acquired pneumonia) Active Problems:   Anemia of chronic disease   Essential (primary) hypertension   Hyperglycemia due to diabetes mellitus ([place])   Leucocytosis   Thrombocytosis   ESRD on hemodialysis (HCC)   PVD (peripheral vascular disease)/ S/p BKA   Altered mental status   Acute respiratory failure with hypoxemia (HCC)   Lactic acidosis   Hypoalbuminemia   Hyponatremia   Hyperlipidemia   Cardiac arrest (HCC)   Paroxysmal atrial fibrillation ([place])  Septic shock: Resolving.  Blood pressure better.  Pressors were weaned  on 7/11.  Also on midodrine.  Acute respiratory failure with hypoxia/hypercarbia: Secondary to severe community-acquired pneumonia.   Was intubated and on mechanical ventilation.  Now extubated.  PCCM were following.  She finished a course of antibiotics.  Currently on room air  ESRD on hemodialysis: Nephrology following.  CRRT stopped on 7/12.  Underwent dialysis.  Previous AVG was not usable for hemodialysis so IR consulted for fistulogram.  Ultrasound duplex of the graft did not show any thrombosis or restenosis, graft again being used.  She also had a temporary dialysis catheter on the left neck which will be removed after a peripheral access is achieved.  Parapneumonic effusion: Drained, chest tube has been taken out  New onset A. fib/flutter: Currently in normal sinus rhythm.  On oral amiodarone.  Plan is to convert the IV heparin to Eliquis at some point/after finishing procedures if needed.  We are checking with vascular surgery if she needs amputation of the left hand  Elevated troponin: Most likely secondary to demand ischemia in the setting of sepsis.  Troponins were flat trend.  2D echo showed normal left ventricular function.  No further work-up recommended by cardiology.  Left radial artery thrombotic occlusion: Status post thrombectomy, distal ischemia.  Continue local wound care.  She may need amputation at some point, vascular surgery has been following and will address that.  We will reconsult vascular surgery today.  Hyperlipidemia: Continue statin  Constipation: Continue bowel regimen  Leukocytosis/thrombocytosis: On reviewing her previous records, she always has chronic leukocytosis along with thrombocytosis.  Could not rule out hematological pathology.  I sent a message to Dr. Germain Osgood  outpatient follow-up  Normocytic anemia: Currently hemoglobin stable.  Continue to monitor  Diabetes type 2: Hospital course remarkable for hypoglycemia.  Monitor blood sugars.  On tube feeding  through core track.  Weakness/deconditioning: PT/OT consulted.  Recommended home health PT on discharge  Dysphagia: Speech  therapy following.  Recommended dysphagia 1 diet.  We are checking with dietitian/speech therapy about taking the tube out.  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  Goals of care: Multiple comorbidities.  Palliative care consulted and following and recommended limited code  Nutrition Problem: Increased nutrient needs Etiology: acute illness      DVT prophylaxis: IV heparin Code Status: Full Family Communication: Called and discussed with daughter on phone on 11/10/20 Status is: Inpatient  Remains inpatient appropriate because:Inpatient level of care appropriate due to severity of illness  Dispo: The patient is from: Home              Anticipated d/c is to: Home              Patient currently is not medically stable to d/c.   Difficult to place patient No Patient is still on tube feeding, on calorie count, vascular surgery has not finalized about the need of amputation of the left hand.   Consultants: PCCM, cardiology, nephrology  Procedures: CRRT, intubation  Important events:  Admitted to hospital on 7/1 7/1 cefepime >  7/1 flagyl x1 7/1 vancomycin>  Admitted to ICU on 10/29/20 for CRRT 7/3 - brief PEA with bearing down while agitated. 7/3 ETT 7/3 left radial arterial line 7/3 L IJ HD cath 7/5 L pigtail CT placed for L pleural effusion 7/6 CT output -550 cc 7/8 A fib, ST elevation noted per EKG, Concern for ischemia, cards consulted 7/12 extubated  Antimicrobials:  Anti-infectives (From admission, onward)    Start     Dose/Rate Route Frequency Ordered Stop   11/10/20 0600  ceFAZolin (ANCEF) IVPB 2g/100 mL premix        2 g 200 mL/hr over 30 Minutes Intravenous To Radiology 11/09/20 1453 11/11/20 0600   11/03/20 2200  vancomycin (VANCOCIN) IVPB 1000 mg/200 mL premix  Status:  Discontinued        1,000 mg 200 mL/hr over 60  Minutes Intravenous Every 24 hours 11/02/20 2017 11/06/20 0750   11/02/20 2115  vancomycin (VANCOREADY) IVPB 1250 mg/250 mL        1,250 mg 166.7 mL/hr over 90 Minutes Intravenous  Once 11/02/20 2017 11/02/20 2245   11/02/20 1930  meropenem (MERREM) 1 g in sodium chloride 0.9 % 100 mL IVPB  Status:  Discontinued        1 g 200 mL/hr over 30 Minutes Intravenous Every 8 hours 11/02/20 1841 11/06/20 0750   10/30/20 1200  vancomycin (VANCOCIN) IVPB 750 mg/150 ml premix  Status:  Discontinued        750 mg 150 mL/hr over 60 Minutes Intravenous Every M-W-F (Hemodialysis) 10/28/20 0521 10/28/20 1311   10/30/20 1200  vancomycin (VANCOCIN) IVPB 1000 mg/200 mL premix  Status:  Discontinued        1,000 mg 200 mL/hr over 60 Minutes Intravenous Every M-W-F (Hemodialysis) 10/28/20 1311 10/29/20 0639   10/29/20 1800  vancomycin (VANCOCIN) IVPB 1000 mg/200 mL premix  Status:  Discontinued        1,000 mg 200 mL/hr over 60 Minutes Intravenous Every 24 hours 10/29/20 0639 10/31/20 1044   10/29/20 1000  ceFEPIme (MAXIPIME) 2 g in sodium chloride 0.9 % 100 mL IVPB  Status:  Discontinued  2 g 200 mL/hr over 30 Minutes Intravenous Every 12 hours 10/29/20 0637 11/02/20 1718   10/28/20 2000  ceFEPIme (MAXIPIME) 1 g in sodium chloride 0.9 % 100 mL IVPB  Status:  Discontinued        1 g 200 mL/hr over 30 Minutes Intravenous Every 24 hours 10/27/20 2109 10/29/20 0637   10/27/20 2100  vancomycin (VANCOCIN) IVPB 1000 mg/200 mL premix        1,000 mg 200 mL/hr over 60 Minutes Intravenous  Once 10/27/20 2057 10/28/20 0008   10/27/20 1900  ceFEPIme (MAXIPIME) 2 g in sodium chloride 0.9 % 100 mL IVPB        2 g 200 mL/hr over 30 Minutes Intravenous  Once 10/27/20 1848 10/27/20 2128   10/27/20 1900  metroNIDAZOLE (FLAGYL) IVPB 500 mg        500 mg 100 mL/hr over 60 Minutes Intravenous  Once 10/27/20 1848 10/27/20 2128   10/27/20 1900  vancomycin (VANCOCIN) IVPB 1000 mg/200 mL premix        1,000 mg 200 mL/hr  over 60 Minutes Intravenous  Once 10/27/20 1848 10/27/20 2046       Subjective:   Patient seen and examined the bedside this morning.  Hemodynamically stable.  Overall comfortable.  Denies any new complaints.  No significant change from yesterday.  Objective: Vitals:   11/11/20 1800 11/11/20 1900 11/11/20 2335 11/12/20 0403  BP: (!) 143/52 (!) 111/37 (!) 141/56 (!) 133/41  Pulse: 78 75  79  Resp: '20 20  20  '$ Temp:  99 F (37.2 C)  99 F (37.2 C)  TempSrc:  Axillary  Axillary  SpO2: 100% 98%  99%  Weight:      Height:        Intake/Output Summary (Last 24 hours) at 11/12/2020 0821 Last data filed at 11/12/2020 0546 Gross per 24 hour  Intake 1517.14 ml  Output --  Net 1517.14 ml   Filed Weights   11/10/20 0500 11/10/20 1325 11/11/20 0500  Weight: 87.8 kg 87.7 kg 90.5 kg    Examination:   General exam: Overall comfortable, obese, significantly deconditioned, chronically ill looking,  HEENT: Feeding tube , temporary dialysis catheter on the left neck Respiratory system:  no wheezes or crackles  Cardiovascular system: S1 & S2 heard, RRR.  Gastrointestinal system: Abdomen is nondistended, soft and nontender. Central nervous system: Alert and oriented Extremities: Bilateral BKA, gangrenous left hand, AV graft on the right arm Skin: No rashes, no ulcers,no icterus      Data Reviewed: I have personally reviewed following labs and imaging studies  CBC: Recent Labs  Lab 11/08/20 0334 11/09/20 0410 11/10/20 0522 11/11/20 0450 11/12/20 0445  WBC 27.8* 21.8* 21.8* 18.5* 19.1*  HGB 10.0* 9.3* 9.6* 8.6* 8.7*  HCT 33.4* 30.7* 31.0* 27.1* 27.7*  MCV 81.3 79.3* 78.5* 77.4* 77.2*  PLT 559* 602* 705* 686* [id number]*   Basic Metabolic Panel: Recent Labs  Lab 11/08/20 0334 11/08/20 1527 11/09/20 0410 11/09/20 2000 11/10/20 0522 11/10/20 2015 11/11/20 0449 11/11/20 0450 11/11/20 1632 11/12/20 0445  NA 134*   < > 133*   < > 134* 135 133*  --  135 132*  K 3.9   < > 4.2    < > 5.3* 4.3 4.3  --  4.9 5.1  CL 101   < > 98   < > 100 99 99  --  102 97*  CO2 24   < > 26   < > 19* 26 26  --  22 24  GLUCOSE 82   < > 208*   < > 72 185* 195*  --  130* 183*  BUN 24*   < > 24*   < > 51* 31* 40*  --  50* 66*  CREATININE 2.66*   < > 2.82*   < > 4.52* 3.40* 4.06*  --  4.95* 5.76*  CALCIUM 9.3   < > 8.6*   < > 9.1 8.5* 8.6*  --  8.6* 8.8*  MG 3.4*  --  2.5*  --  2.8*  --   --  2.6*  --  2.9*  PHOS 4.6   < > 2.8   < > 4.2 3.3 3.2  --  4.2 5.3*   < > = values in this interval not displayed.   GFR: Estimated Creatinine Clearance: 10.9 mL/min (A) (by C-G formula based on SCr of 5.76 mg/dL (H)). Liver Function Tests: Recent Labs  Lab 11/10/20 0522 11/10/20 2015 11/11/20 0449 11/11/20 1632 11/12/20 0445  ALBUMIN 2.1* 1.8* 1.9* 1.9* 1.9*   No results for input(s): LIPASE, AMYLASE in the last 168 hours. No results for input(s): AMMONIA in the last 168 hours. Coagulation Profile: No results for input(s): INR, PROTIME in the last 168 hours. Cardiac Enzymes: No results for input(s): CKTOTAL, CKMB, CKMBINDEX, TROPONINI in the last 168 hours. BNP (last 3 results) No results for input(s): PROBNP in the last 8760 hours. HbA1C: No results for input(s): HGBA1C in the last 72 hours. CBG: Recent Labs  Lab 11/11/20 1512 11/11/20 1936 11/12/20 0122 11/12/20 0438 11/12/20 0752  GLUCAP 95 114* 173* 167* 206*   Lipid Profile: No results for input(s): CHOL, HDL, LDLCALC, TRIG, CHOLHDL, LDLDIRECT in the last 72 hours. Thyroid Function Tests: No results for input(s): TSH, T4TOTAL, FREET4, T3FREE, THYROIDAB in the last 72 hours. Anemia Panel: No results for input(s): VITAMINB12, FOLATE, FERRITIN, TIBC, IRON, RETICCTPCT in the last 72 hours. Sepsis Labs: No results for input(s): PROCALCITON, LATICACIDVEN in the last 168 hours.  Recent Results (from the past 240 hour(s))  Culture, Respiratory w Gram Stain     Status: None   Collection Time: 11/02/20  8:40 AM   Specimen:  Tracheal Aspirate; Respiratory  Result Value Ref Range Status   Specimen Description TRACHEAL ASPIRATE  Final   Special Requests NONE  Final   Gram Stain   Final    FEW WBC PRESENT, PREDOMINANTLY PMN NO ORGANISMS SEEN    Culture   Final    RARE Normal respiratory flora-no Staph aureus or Pseudomonas seen Performed at [facility], [address] [place], [place] [address]    Report Status 11/04/2020 FINAL  Final  Culture, blood (routine x 2)     Status: None   Collection Time: 11/02/20  7:10 PM   Specimen: BLOOD LEFT HAND  Result Value Ref Range Status   Specimen Description BLOOD LEFT HAND  Final   Special Requests   Final    BOTTLES DRAWN AEROBIC ONLY Blood Culture results may not be optimal due to an inadequate volume of blood received in culture bottles   Culture   Final    NO GROWTH 5 DAYS Performed at [facility], [place] [address] [place], [place] [address]    Report Status 11/07/2020 FINAL  Final  Culture, blood (routine x 2)     Status: None   Collection Time: 11/02/20  7:10 PM   Specimen: BLOOD LEFT HAND  Result Value Ref Range Status   Specimen  Description BLOOD LEFT HAND  Final   Special Requests   Final    BOTTLES DRAWN AEROBIC ONLY Blood Culture results may not be optimal due to an inadequate volume of blood received in culture bottles   Culture   Final    NO GROWTH 5 DAYS Performed at [facility], [place] [address], [place], [place] [address]    Report Status 11/07/2020 FINAL  Final         Radiology Studies: No results found.      Scheduled Meds:  amiodarone  400 mg Oral BID   Followed by   [name] ON 11/13/2020] amiodarone  200 mg Oral Daily   aspirin  81 mg Per Tube Daily   atorvastatin  40 mg Per Tube Daily   chlorhexidine  15 mL Mouth Rinse BID   Chlorhexidine Gluconate Cloth  6 each Topical Q0600   Chlorhexidine Gluconate Cloth  6 each Topical Q0600   clopidogrel  75 mg Per Tube Daily   darbepoetin (ARANESP) injection -  DIALYSIS  100 mcg Intravenous Q Fri-HD   feeding supplement (NEPRO CARB STEADY)  1,000 mL Per Tube Q24H   feeding supplement (PROSource TF)  45 mL Per Tube QID   insulin aspart  3-9 Units Subcutaneous Q4H   mouth rinse  15 mL Mouth Rinse q12n4p   midodrine  5 mg Per Tube TID WC   multivitamin  1 tablet Oral QHS   nystatin   Topical BID   polyethylene glycol  17 g Per Tube Daily   sodium chloride flush  10-40 mL Intracatheter Q12H   Continuous Infusions:  sodium chloride Stopped (11/07/20 1143)   heparin 1,500 Units/hr (11/12/20 0546)     LOS: 16 days    Time spent: 35 mins.More than 50% of that time was spent in counseling and/or coordination of care.      Shelly Coss, MD [facility] P7/17/2022, 8:21 AM

## 2020-11-12 NOTE — Progress Notes (Signed)
[facility]  Subjective  - states her left hand feels ok.   Objective (!) 139/45 79 98.9 F (37.2 C) (Oral) 20 99%  Intake/Output Summary (Last 24 hours) at 11/12/2020 1320 Last data filed at 11/12/2020 0546 Gross per 24 hour  Intake 1142.83 ml  Output --  Net 1142.83 ml     Left radial pulse palpable at the wrist Incision clean dry and intact Dry gangrene of the tips of the left first second and fifth digits  Laboratory Lab Results: Recent Labs    11/11/20 0450 11/12/20 0445  WBC 18.5* 19.1*  HGB 8.6* 8.7*  HCT 27.1* 27.7*  PLT 686* 758*   BMET Recent Labs    11/11/20 1632 11/12/20 0445  NA 135 132*  K 4.9 5.1  CL 102 97*  CO2 22 24  GLUCOSE 130* 183*  BUN 50* 66*  CREATININE 4.95* 5.76*  CALCIUM 8.6* 8.8*    COAG Lab Results  Component Value Date   INR 1.5 (H) 10/28/2020   INR 1.5 (H) 10/27/2020   INR 1.1 09/14/2020   No results found for: PTT  Assessment/Planning:  65 year old female that was critically ill in the ICU and developed left hand ischemia with evidence of radial artery thrombosis after arterial line.  Ultimately underwent left radial artery thrombectomy with patch angioplasty.  She has a palpable radial pulse at the wrist and the incision looks good.  Her first second and fifth digits do have dry gangrene at the tips.  Will need to engage hand surgery next week to evaluate for finger amputations but this is not urgent given this is dry gangrene.  [name] 11/12/2020 1:20 PM --

## 2020-11-12 NOTE — Progress Notes (Signed)
  Speech Language Pathology Treatment: Dysphagia  Patient Details Name: Christy Zhang MRN: [id number] DOB: 1956/04/28 Today's Date: 11/12/2020 Time: [id number] SLP Time Calculation (min) (ACUTE ONLY): 12 min  Assessment / Plan / Recommendation Clinical Impression  Pt was seen for dysphagia treatment. She was alert and cooperative during the session. Pt's RN reported that the pt did not want breakfast and pt attributed this to the food not having flavor. Pt tolerated dysphagia 2 and regular texture solids without overt s/sx of aspiration. Mastication was prolonged with regular textures due to reduced dentition. Pt's tolerance of thin liquids appears to be improved and she inconsistently demonstrated throat clearing and coughing with thin liquids via cup. Pt's symptoms appeared at least partly related to intake of larger boluses, but she exhibited difficulty consistently reducing bolus size. Pt's diet will be advanced to dysphagia 2 solids and nectar thick liquids will be continued. SLP will continue to follow pt.    HPI HPI: Pt is a 65 yo female presented 7/1 with septic shock from PNA, requiring intubation (7/3-7/11) and CRRT (started 7/3). Brief PEA 7/3. PMH includes: CHF, CKD, T2DM, ESRD, HLD, HTN      SLP Plan  Continue with current plan of care       Recommendations  Diet recommendations: Dysphagia 2 (fine chop);Nectar-thick liquid Liquids provided via: Cup;Straw Medication Administration: Crushed with puree Supervision: Staff to assist with self feeding Compensations: Minimize environmental distractions;Slow rate;Small sips/bites Postural Changes and/or Swallow Maneuvers: Seated upright 90 degrees                Oral Care Recommendations: Oral care QID Follow up Recommendations: Home health SLP SLP Visit Diagnosis: Dysphagia, oropharyngeal phase (R13.12) Plan: Continue with current plan of care       Francesco Provencal I. Hardin Negus, [place], [place] Office number [phone] Pager [place] 11/12/2020, 11:03 AM

## 2020-11-12 NOTE — Progress Notes (Signed)
[facility] NEPHROLOGY PROGRESS NOTE  Assessment/ Plan: Pt is a 65 y.o. yo female  with history of HTN, HLD, peripheral vascular disease status post bilateral BKA, DM, anemia, ESRD on HD MWF at [facility] presented with cough, shortness of breath and generalized weakness, seen as a consultation for the management of dialysis.  Dialysis Orders from previous admission:  [facility] - MWF 4 hrs 400/500 97 kg 2.0K/2.5 Ca 16g needles -Heparin 1000 units initial bolus-heparin pump 1000 units/hr stop 1 hour prior to end of tx  -Epogen 14000 units IV TIW -Hectorol 1.5 mcg IV TIW  # ESRD MWF schedule.  S/p CRRT, stopped 7/12.  Difficulty using her fistula NOS Wed but worked ok Friday. Duplex of AVF ok.  Plan next HD Monday using AVF.  D/C L IJ temp cath - defer to primary to order as she may be relying on the trialysis line for access.   #Septic shock due to left lung pneumonia: resolved s/p course of abx  #Thrombosed left radial artery/left hand ischemia -2/2 previous a line and pressors -s/p left radial artery thrombectomy and angio 7/9 w/ VVS  #Possible inferior STEMI -not a candidate for intervention currently. Per cardiology  #A. fib with RVR: Currently on amiodarone and heparin.  Appears plans for conversion from heparin to eliquis - ok to do from nephrology perspective given no angiogram needed for AVF. Cardiology is following.   # Anemia: Hemoglobin 8.7  today.  Continue to monitor.  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  #Possible left lower pole renal neoplasm -workup as an outpatient, will need cross sectional imaging   [name] MD [facility] Pager [phone]  Subjective:  Seen in room.  She feels well today, no new issues.   Objective Vital signs in last 24 hours: Vitals:   11/11/20 1800 11/11/20 1900 11/11/20 2335 11/12/20 0403  BP: (!) 143/52 (!) 111/37 (!) 141/56 (!)  133/41  Pulse: 78 75  79  Resp: '20 20  20  '$ Temp:  99 F (37.2 C)  99 F (37.2 C)  TempSrc:  Axillary  Axillary  SpO2: 100% 98%  99%  Weight:      Height:       Weight change:   Intake/Output Summary (Last 24 hours) at 11/12/2020 0936 Last data filed at 11/12/2020 0546 Gross per 24 hour  Intake 1502.63 ml  Output --  Net 1502.63 ml      Labs: Basic Metabolic Panel: Recent Labs  Lab 11/11/20 0449 11/11/20 1632 11/12/20 0445  NA 133* 135 132*  K 4.3 4.9 5.1  CL 99 102 97*  CO2 '26 22 24  '$ GLUCOSE 195* 130* 183*  BUN 40* 50* 66*  CREATININE 4.06* 4.95* 5.76*  CALCIUM 8.6* 8.6* 8.8*  PHOS 3.2 4.2 5.3*    Liver Function Tests: Recent Labs  Lab 11/11/20 0449 11/11/20 1632 11/12/20 0445  ALBUMIN 1.9* 1.9* 1.9*    No results for input(s): LIPASE, AMYLASE in the last 168 hours. No results for input(s): AMMONIA in the last 168 hours. CBC: Recent Labs  Lab 11/08/20 0334 11/09/20 0410 11/10/20 0522 11/11/20 0450 11/12/20 0445  WBC 27.8* 21.8* 21.8* 18.5* 19.1*  HGB 10.0* 9.3* 9.6* 8.6* 8.7*  HCT 33.4* 30.7* 31.0* 27.1* 27.7*  MCV 81.3 79.3* 78.5* 77.4* 77.2*  PLT 559* 602* 705* 686* 758*    Cardiac Enzymes: No results for input(s): CKTOTAL, CKMB, CKMBINDEX, TROPONINI  in the last 168 hours. CBG: Recent Labs  Lab 11/11/20 1512 11/11/20 1936 11/12/20 0122 11/12/20 0438 11/12/20 0752  GLUCAP 95 114* 173* 167* 206*     Iron Studies: No results for input(s): IRON, TIBC, TRANSFERRIN, FERRITIN in the last 72 hours. Studies/Results: No results found.  Medications: Infusions:  sodium chloride Stopped (11/07/20 1143)   heparin 1,500 Units/hr (11/12/20 0546)    Scheduled Medications:  amiodarone  400 mg Oral BID   Followed by   [name] ON 11/13/2020] amiodarone  200 mg Oral Daily   aspirin  81 mg Per Tube Daily   atorvastatin  40 mg Per Tube Daily   chlorhexidine  15 mL Mouth Rinse BID   Chlorhexidine Gluconate Cloth  6 each Topical Q0600    Chlorhexidine Gluconate Cloth  6 each Topical Q0600   clopidogrel  75 mg Per Tube Daily   darbepoetin (ARANESP) injection - DIALYSIS  100 mcg Intravenous Q Fri-HD   feeding supplement (NEPRO CARB STEADY)  1,000 mL Per Tube Q24H   feeding supplement (PROSource TF)  45 mL Per Tube QID   insulin aspart  3-9 Units Subcutaneous Q4H   mouth rinse  15 mL Mouth Rinse q12n4p   midodrine  5 mg Per Tube TID WC   multivitamin  1 tablet Oral QHS   nystatin   Topical BID   polyethylene glycol  17 g Per Tube Daily   sodium chloride flush  10-40 mL Intracatheter Q12H    have reviewed scheduled and prn medications.  Physical Exam: General: Chronically ill appearing, but in no distress Heart:RRR, s1s2 nl Lungs: [place], b/l chest expansion Abdomen:soft,  non-distended Extremities: Bilateral BKA, no stump/thigh edema, R stump with scab no erythema or drainage; left hand with several necrotic digits and hand with patchy duskiness that is stable Neuro: awake, following commands Dialysis Access: Left IJ temporary trialysis catheter c/d/i.  AV fistula has + thrill and bruit Justin Mend 11/12/2020,9:36 AM  LOS: 16 days

## 2020-11-12 NOTE — Plan of Care (Signed)

## 2020-11-12 NOTE — Progress Notes (Signed)
Called to place peripheral IV  for HD cath removal. 2 IV RN's have assessed patient per protocol and no suitable veins were found to attempt. MD communicated to leave [facility]- HD in place.

## 2020-11-13 DIAGNOSIS — J189 Pneumonia, unspecified organism: Secondary | ICD-10-CM | POA: Diagnosis not present

## 2020-11-13 DIAGNOSIS — I4819 Other persistent atrial fibrillation: Secondary | ICD-10-CM | POA: Diagnosis not present

## 2020-11-13 LAB — RENAL FUNCTION PANEL
Albumin: 1.9 g/dL — ABNORMAL LOW (ref 3.5–5.0)
Albumin: 2 g/dL — ABNORMAL LOW (ref 3.5–5.0)
Anion gap: 14 (ref 5–15)
Anion gap: 12 (ref 5–15)
BUN: 93 mg/dL — ABNORMAL HIGH (ref 8–23)
BUN: 35 mg/dL — ABNORMAL HIGH (ref 8–23)
CO2: 21 mmol/L — ABNORMAL LOW (ref 22–32)
CO2: 23 mmol/L (ref 22–32)
Calcium: 8.7 mg/dL — ABNORMAL LOW (ref 8.9–10.3)
Calcium: 8.3 mg/dL — ABNORMAL LOW (ref 8.9–10.3)
Chloride: 96 mmol/L — ABNORMAL LOW (ref 98–111)
Chloride: 99 mmol/L (ref 98–111)
Creatinine, Ser: 7.31 mg/dL — ABNORMAL HIGH (ref 0.44–1.00)
Creatinine, Ser: 3.72 mg/dL — ABNORMAL HIGH (ref 0.44–1.00)
GFR, Estimated: 6 mL/min — ABNORMAL LOW (ref >60)
GFR, Estimated: 13 mL/min — ABNORMAL LOW (ref >60)
Glucose, Bld: 192 mg/dL — ABNORMAL HIGH (ref 70–99)
Glucose, Bld: 126 mg/dL — ABNORMAL HIGH (ref 70–99)
Phosphorus: 5.4 mg/dL — ABNORMAL HIGH (ref 2.5–4.6)
Phosphorus: 2.8 mg/dL (ref 2.5–4.6)
Potassium: 4.8 mmol/L (ref 3.5–5.1)
Potassium: 4.8 mmol/L (ref 3.5–5.1)
Sodium: 131 mmol/L — ABNORMAL LOW (ref 135–145)
Sodium: 134 mmol/L — ABNORMAL LOW (ref 135–145)

## 2020-11-13 LAB — CBC
HCT: 26.9 % — ABNORMAL LOW (ref 36.0–46.0)
Hemoglobin: 8.5 g/dL — ABNORMAL LOW (ref 12.0–15.0)
MCH: 24.2 pg — ABNORMAL LOW (ref 26.0–34.0)
MCHC: 31.6 g/dL (ref 30.0–36.0)
MCV: 76.6 fL — ABNORMAL LOW (ref 80.0–100.0)
Platelets: 810 10*3/uL — ABNORMAL HIGH (ref 150–400)
RBC: 3.51 MIL/uL — ABNORMAL LOW (ref 3.87–5.11)
RDW: 21 % — ABNORMAL HIGH (ref 11.5–15.5)
WBC: 19.7 10*3/uL — ABNORMAL HIGH (ref 4.0–10.5)
nRBC: 0 % (ref 0.0–0.2)

## 2020-11-13 LAB — GLUCOSE, CAPILLARY
Glucose-Capillary: 152 mg/dL — ABNORMAL HIGH (ref 70–99)
Glucose-Capillary: 156 mg/dL — ABNORMAL HIGH (ref 70–99)
Glucose-Capillary: 173 mg/dL — ABNORMAL HIGH (ref 70–99)
Glucose-Capillary: 86 mg/dL (ref 70–99)
Glucose-Capillary: 159 mg/dL — ABNORMAL HIGH (ref 70–99)
Glucose-Capillary: 106 mg/dL — ABNORMAL HIGH (ref 70–99)
Glucose-Capillary: 129 mg/dL — ABNORMAL HIGH (ref 70–99)

## 2020-11-13 LAB — HEPARIN LEVEL (UNFRACTIONATED): Heparin Unfractionated: 0.29 IU/mL — ABNORMAL LOW (ref 0.30–0.70)

## 2020-11-13 MED ORDER — COLLAGENASE 250 UNIT/GM EX OINT
TOPICAL_OINTMENT | Freq: Every day | CUTANEOUS | Status: DC
  Filled 2020-11-13: qty 30

## 2020-11-13 MED ORDER — PROSOURCE PLUS PO LIQD
30.0000 mL | Freq: Three times a day (TID) | ORAL | Status: DC
  Administered 2020-11-13 – 2020-11-14 (×2): 30 mL via ORAL
  Filled 2020-11-13 (×2): qty 30

## 2020-11-13 NOTE — Progress Notes (Signed)
**Christy Christy** Christy Christy  Assessment/ Plan: Pt is a 65 y.o. yo female  with history of HTN, HLD, peripheral vascular disease status post bilateral BKA, DM, anemia, ESRD on HD MWF at [facility] presented with cough, shortness of breath and generalized weakness.  Dialysis Orders from previous admission:  [facility] - MWF 4 hrs 400/500 97 kg 2.0K/2.5 Ca 16g needles -Heparin 1000 units initial bolus-heparin pump 1000 units/hr stop 1 hour prior to end of tx  -Epogen 14000 units IV TIW -Hectorol 1.5 mcg IV TIW  # ESRD MWF schedule.  S/p CRRT, stopped 7/12.  Difficulty using her fistula NOS Wed but worked ok Friday. and today.  Duplex of AVF ok.  Plan next HD Wednesday using AVF.  D/C L IJ temp cath -   #Septic shock due to left lung pneumonia: resolved s/p course of abx  #Thrombosed left radial artery/left hand ischemia -2/2 previous a line and pressors -s/p left radial artery thrombectomy and angio 7/9 w/ VVS  #Possible inferior STEMI -not a candidate for intervention currently. Per cardiology  #A. fib with RVR: Currently on amiodarone and heparin.  Appears plans for conversion from heparin to eliquis - ok to do from nephrology perspective given no angiogram needed for AVF. Cardiology is following.   # Anemia: Hemoglobin 8.7  today.  Continue to monitor.  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  She feels well today, no new issues. Not sure what dispo plan is   Objective Vital signs in last 24 hours: Vitals:   11/13/20 0855 11/13/20 0900 11/13/20 0901 11/13/20 0930  BP: (!) 110/39  (!) 109/49 (!) 107/41  Pulse:  (!) 160 82 (!) 105  Resp:  (!) 24  (!) 21  Temp: 97.6 F (36.4 C)     TempSrc: Oral      SpO2:  100%  (!) 52%  Weight: 91.9 kg     Height:       Weight change:   Intake/Output Summary (Last 24 hours) at 11/13/2020 0939 Last data filed at 11/12/2020 1500 Gross per 24 hour  Intake 498.43 ml  Output --  Net 498.43 ml     Labs: Basic Metabolic Panel: Recent Labs  Lab 11/11/20 1632 11/12/20 0445 11/12/20 1623  NA 135 132* 137  K 4.9 5.1 3.5  CL 102 97* 112*  CO2 22 24 15*  GLUCOSE 130* 183* 128*  BUN 50* 66* 54*  CREATININE 4.95* 5.76* 4.27*  CALCIUM 8.6* 8.8* 5.9*  PHOS 4.2 5.3* 3.4   Liver Function Tests: Recent Labs  Lab 11/11/20 1632 11/12/20 0445 11/12/20 1623  ALBUMIN 1.9* 1.9* 1.2*   No results for input(s): LIPASE, AMYLASE in the last 168 hours. No results for input(s): AMMONIA in the last 168 hours. CBC: Recent Labs  Lab 11/08/20 0334 11/09/20 0410 11/10/20 0522 11/11/20 0450 11/12/20 0445  WBC 27.8* 21.8* 21.8* 18.5* 19.1*  HGB 10.0* 9.3* 9.6* 8.6* 8.7*  HCT 33.4* 30.7* 31.0* 27.1* 27.7*  MCV 81.3 79.3* 78.5* 77.4* 77.2*  PLT 559* 602* 705* 686* 758*   Cardiac Enzymes: No results for input(s): CKTOTAL, CKMB, CKMBINDEX, TROPONINI in the last 168 hours. CBG: Recent Labs  Lab 11/12/20 1550 11/12/20 2150 11/13/20 0115 11/13/20 0449  11/13/20 0758  GLUCAP 168* 174* 152* 156* 173*    Iron Studies: No results for input(s): IRON, TIBC, TRANSFERRIN, FERRITIN in the last 72 hours. Studies/Results: No results found.  Medications: Infusions:  sodium chloride Stopped (11/07/20 1143)   heparin 1,500 Units/hr (11/12/20 2221)    Scheduled Medications:  amiodarone  400 mg Oral BID   Followed by   amiodarone  200 mg Oral Daily   aspirin  81 mg Per Tube Daily   atorvastatin  40 mg Per Tube Daily   chlorhexidine  15 mL Mouth Rinse BID   Chlorhexidine Gluconate Cloth  6 each Topical Q0600   Chlorhexidine Gluconate Cloth  6 each Topical Q0600   clopidogrel  75 mg Per Tube Daily   darbepoetin (ARANESP) injection - DIALYSIS  100 mcg  Intravenous Q Fri-HD   feeding supplement (NEPRO CARB STEADY)  1,000 mL Per Tube Q24H   feeding supplement (PROSource TF)  45 mL Per Tube QID   insulin aspart  3-9 Units Subcutaneous Q4H   mouth rinse  15 mL Mouth Rinse q12n4p   midodrine  5 mg Per Tube TID WC   multivitamin  1 tablet Oral QHS   nystatin   Topical BID   polyethylene glycol  17 g Per Tube Daily   sodium chloride flush  10-40 mL Intracatheter Q12H    have reviewed scheduled and prn medications.  Physical Exam: General: Chronically ill appearing, but in no distress Heart:RRR, s1s2 nl Lungs: [place], b/l chest expansion Abdomen:soft,  non-distended Extremities: Bilateral BKA, no stump/thigh edema, R stump with scab no erythema or drainage; left hand with several necrotic digits and hand with patchy duskiness that is stable Neuro: awake, following commands Dialysis Access: Left IJ temporary trialysis catheter c/d/i.  AV fistula has + thrill and bruit-  being used for HD today   Darriel Sinquefield A Khadeja Abt 11/13/2020,9:39 AM  LOS: 17 days

## 2020-11-13 NOTE — Progress Notes (Signed)
Nutrition Follow Up  DOCUMENTATION CODES:   Not applicable  INTERVENTION:   D/C Calorie count. D/C Cortrak tube and TF. Continue Rena-vit daily.  Continue Magic cup TID with meals, each supplement provides 290 kcal and 9 grams of protein. Prosource Plus 30 ml PO TID, each packet provides 100 kcal and 15 gm protein. Encourage intake of meals and supplements.  NUTRITION DIAGNOSIS:   Increased nutrient needs related to acute illness as evidenced by estimated needs.  Ongoing  GOAL:   Patient will meet greater than or equal to 90% of their needs  Progressing  MONITOR:   PO intake, Supplement acceptance, Labs, Skin  REASON FOR ASSESSMENT:   Consult, Ventilator Enteral/tube feeding initiation and management  ASSESSMENT:   Patient with PMH significant for HTN, HLD, PVD s/p bilateral BKA, DM, ESRD on HD, and CHF. Presents this admission with volume overload and PNA with pleural effusions.  Only one meal documented over the weekend for Calorie Count: Dinner 7/15 (100%)= 680 kcal and 12 gm protein  Patient just back from HD when RD visited her this afternoon. She was uncomfortable lying in her bed. She was ready to eat lunch, RD notified nursing staff at nursing station. Patient says she likes the Magic cup supplements if they are frozen. Will also add protein supplement to maximize protein intake.   EDW: 97 kg  Admission weight: 89.7 kg  Current weight: 90 kg   UOP: anuric  Medications: aranesp, SS novolog, rena-vit, miralax Labs: Na 131 (L) Phos 5.4 (H) CBG: [phone]  Diet Order:   Diet Order             DIET DYS 2 Room service appropriate? Yes with Assist; Fluid consistency: Nectar Thick  Diet effective now                   EDUCATION NEEDS:   Not appropriate for education at this time  Skin:  Skin Assessment: Skin Integrity Issues: Skin Integrity Issues:: DTI, Unstageable, Other (Comment) DTI: L hip Unstageable: sacrum, back Incisions: R  BKA Other: MASD groin  Last BM:  7/17 type 7  Height:   Ht Readings from Last 1 Encounters:  10/29/20 '5\' 4"'$  (1.626 m)    Weight:   Wt Readings from Last 1 Encounters:  11/13/20 90 kg    BMI:  Body mass index is 34.06 kg/m.  Estimated Nutritional Needs:   Kcal:  2100-2300 kcal  Protein:  120-140 grams  Fluid:  1000 ml + UOP   Lucas Mallow, RD, LDN, CNSC Please refer to Amion for contact information.

## 2020-11-13 NOTE — Progress Notes (Signed)
ANTICOAGULATION CONSULT NOTE - Follow Up Consult  Pharmacy Consult for heparin Indication: atrial fibrillation   Labs: Recent Labs     0000 11/11/20 0449 11/11/20 0450 11/11/20 1632 11/12/20 0445 11/12/20 1623 11/13/20 0915  HGB   < >  --  8.6*  --  8.7*  --  8.5*  HCT  --   --  27.1*  --  27.7*  --  26.9*  PLT  --   --  686*  --  758*  --  810*  HEPARINUNFRC  --  0.31  --   --  0.36  --  0.29*  CREATININE  --  4.06*  --    < > 5.76* 4.27* 7.31*   < > = values in this interval not displayed.     Assessment: 65 yo W on heparin for new afib. No anticoagulation prior to admission. Pharmacy consulted for heparin.    Possible procedures needed w/ recent left radial artery thrombotic occlusion and also with recent HD access concerns. Holding on oral anticoagulation. -heparin level slightly lower than goal at 0.29. hg= 8.5   Goal of Therapy:  Heparin level 0.3-0.7 units/ml Monitor platelets per anticoagulation protocol: Yes    Plan:  Increase heparin to 1600 units / hr Check HL and CBC daily   Adja Ruff A. Levada Dy, PharmD, BCPS, FNKF Clinical Pharmacist [place] Please utilize Amion for appropriate phone number to reach the unit pharmacist ([place])

## 2020-11-13 NOTE — Procedures (Signed)
Patient was seen on dialysis and the procedure was supervised.  BFR 400  Via AVF BP is  107/41.   Patient appears to be tolerating treatment well  Louis Meckel 11/13/2020

## 2020-11-13 NOTE — Progress Notes (Signed)
PROGRESS NOTE    OLER TUSZYNSKI  [id number] DOB: 10/26/1955 DOA: 10/27/2020 PCP: Claretta Fraise, MD   Chief Complain: Altered mental status  Brief Narrative: Patient is a 20 old female with history of bilateral BKA, ESRD on dialysis on Monday/Wednesday/Friday, type 2 diabetes, hypertension, peripheral vascular disease, hyperlipidemia who presented to the emergency department for the evaluation of altered mental status.  She was complaining of 2-week history of intermittent shortness of breath, dry cough, fatigue, weakness.  On presentation she was hypertensive.  Lab work showed leukocytosis, hyponatremia, hyperglycemia, creatinine of 6.4, lactic acid of 2.9.  Chest x-ray showed acute consolidation/pneumonia with fullness.  Patient was started on Rocephin antibiotics for septic shock, started on IV fluids.  She was intubated for airway protection, put on mechanical ventilation.  She also was started on CRRT on 7/3 in the setting of septic shock from pneumonia causing acute respiratory failure with hypoxia and AKI.  Hospital course remarkable for new onset A. fib, cardiology were following. Overall, she is improving.  Still on tube feeding but also on dysphagia 2 diet.On calorie count as per RD. plan is to remove the core track after recommendation as per RD, speech.  Orthopedics consulted today for evaluation of her gangrenous left hand  Assessment & Plan:   Principal Problem:   CAP (community acquired pneumonia) Active Problems:   Anemia of chronic disease   Essential (primary) hypertension   Hyperglycemia due to diabetes mellitus (HCC)   Leucocytosis   Thrombocytosis   ESRD on hemodialysis (HCC)   PVD (peripheral vascular disease)/ S/p BKA   Altered mental status   Acute respiratory failure with hypoxemia (HCC)   Lactic acidosis   Hypoalbuminemia   Hyponatremia   Hyperlipidemia   Cardiac arrest (HCC)   Paroxysmal atrial fibrillation ([place])  Septic shock: Resolved.  Blood  pressure better.  Pressors were weaned  on 7/11.  Also on midodrine.  Blood culture did not show any growth  Acute respiratory failure with hypoxia/hypercarbia: Secondary to severe community-acquired pneumonia.  Was intubated and on mechanical ventilation.  Now extubated.  PCCM were following.  She finished a course of antibiotics.  Currently on room air  ESRD on hemodialysis: Nephrology following.  CRRT stopped on 7/12.  Underwent dialysis.  Previous AVG was not usable for hemodialysis so IR consulted for fistulogram.  Ultrasound duplex of the graft did not show any thrombosis or restenosis, graft again being used.  She also had a temporary dialysis catheter on the left neck which will be kept for now because of lack of peripheral access .  Parapneumonic effusion: Drained, chest tube has been taken out  New onset A. fib/flutter: Currently in normal sinus rhythm.  On oral amiodarone.  Plan is to convert the IV heparin to Eliquis at some point/after finishing procedures if needed.  We are checking with orthopedics if she needs amputation of the left finger tips  Elevated troponin: Most likely secondary to demand ischemia in the setting of sepsis.  Troponins were flat trend.  2D echo showed normal left ventricular function.  No further work-up recommended by cardiology.  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  Continue local wound care.  She may need amputation of left fingertips, orthopedics following.  Vascular surgery was also following.  Hyperlipidemia: Continue statin  Constipation: Continue bowel regimen  Leukocytosis/thrombocytosis: On reviewing her previous records, she always has chronic leukocytosis along with thrombocytosis.  Could not rule out hematological pathology.  I sent a  message to Dr. Germain Osgood  outpatient follow-up  Normocytic anemia: Currently hemoglobin stable.  Continue to monitor. Associated with  ESRD  Diabetes type 2:   Monitor blood sugars.  On tube feeding through core track.  Weakness/deconditioning: PT/OT consulted.  Recommended home health PT on discharge  Dysphagia: Speech therapy following.  Recommended dysphagia 12diet.  We are checking with dietitian/speech therapy about taking the tube out.  Since patient has has not adequate oral intake, dietitian recommended to continue feeding via tube and calorie count  Suspected left lower pole renal neoplasm: Work-up as an outpatient recommended.  Goals of care: Multiple comorbidities.  Palliative care consulted and following and recommended limited code  Pressure Injury 10/28/20 Sacrum Right;Mid Unstageable - Full thickness tissue loss in which the base of the injury is covered by slough (yellow, tan, gray, green or brown) and/or eschar (tan, brown or black) in the wound bed. (Active)  10/28/20 1500  Location: Sacrum  Location Orientation: Right;Mid  Staging: Unstageable - Full thickness tissue loss in which the base of the injury is covered by slough (yellow, tan, gray, green or brown) and/or eschar (tan, brown or black) in the wound bed.  Wound Description (Comments):   Present on Admission: Yes     Pressure Injury 11/13/20 Back Mid Unstageable - Full thickness tissue loss in which the base of the injury is covered by slough (yellow, tan, gray, green or brown) and/or eschar (tan, brown or black) in the wound bed. (Active)  11/13/20   Location: Back  Location Orientation: Mid  Staging: Unstageable - Full thickness tissue loss in which the base of the injury is covered by slough (yellow, tan, gray, green or brown) and/or eschar (tan, brown or black) in the wound bed.  Wound Description (Comments):   Present on Admission: No     Pressure Injury 11/13/20 Hip Left Deep Tissue Pressure Injury - Purple or maroon localized area of discolored intact skin or blood-filled blister due to damage of underlying soft tissue from pressure  and/or shear. (Active)  11/13/20   Location: Hip  Location Orientation: Left  Staging: Deep Tissue Pressure Injury - Purple or maroon localized area of discolored intact skin or blood-filled blister due to damage of underlying soft tissue from pressure and/or shear.  Wound Description (Comments):   Present on Admission: No        Nutrition Problem: Increased nutrient needs Etiology: acute illness      DVT prophylaxis: IV heparin Code Status: Full Family Communication: Called and discussed with daughter on phone on 11/10/20 Status is: Inpatient  Remains inpatient appropriate because:Inpatient level of care appropriate due to severity of illness  Dispo: The patient is from: Home              Anticipated d/c is to: Home              Patient currently is not medically stable to d/c.   Difficult to place patient No Patient is still on tube feeding, on calorie count, orthopedic surgery has not finalized about the need of amputation of the left hand.   Consultants: PCCM, cardiology, nephrology  Procedures: CRRT, intubation  Important events:  Admitted to hospital on 7/1 7/1 cefepime >  7/1 flagyl x1 7/1 vancomycin>  Admitted to ICU on 10/29/20 for CRRT 7/3 - brief PEA with bearing down while agitated. 7/3 ETT 7/3 left radial arterial line 7/3 L IJ HD cath 7/5 L pigtail CT placed for L pleural effusion 7/6 CT output -550 cc  7/8 A fib, ST elevation noted per EKG, Concern for ischemia, cards consulted 7/12 extubated  Antimicrobials:  Anti-infectives (From admission, onward)    Start     Dose/Rate Route Frequency Ordered Stop   11/10/20 0600  ceFAZolin (ANCEF) IVPB 2g/100 mL premix        2 g 200 mL/hr over 30 Minutes Intravenous To Radiology 11/09/20 1453 11/11/20 0600   11/03/20 2200  vancomycin (VANCOCIN) IVPB 1000 mg/200 mL premix  Status:  Discontinued        1,000 mg 200 mL/hr over 60 Minutes Intravenous Every 24 hours 11/02/20 2017 11/06/20 0750   11/02/20 2115   vancomycin (VANCOREADY) IVPB 1250 mg/250 mL        1,250 mg 166.7 mL/hr over 90 Minutes Intravenous  Once 11/02/20 2017 11/02/20 2245   11/02/20 1930  meropenem (MERREM) 1 g in sodium chloride 0.9 % 100 mL IVPB  Status:  Discontinued        1 g 200 mL/hr over 30 Minutes Intravenous Every 8 hours 11/02/20 1841 11/06/20 0750   10/30/20 1200  vancomycin (VANCOCIN) IVPB 750 mg/150 ml premix  Status:  Discontinued        750 mg 150 mL/hr over 60 Minutes Intravenous Every M-W-F (Hemodialysis) 10/28/20 0521 10/28/20 1311   10/30/20 1200  vancomycin (VANCOCIN) IVPB 1000 mg/200 mL premix  Status:  Discontinued        1,000 mg 200 mL/hr over 60 Minutes Intravenous Every M-W-F (Hemodialysis) 10/28/20 1311 10/29/20 0639   10/29/20 1800  vancomycin (VANCOCIN) IVPB 1000 mg/200 mL premix  Status:  Discontinued        1,000 mg 200 mL/hr over 60 Minutes Intravenous Every 24 hours 10/29/20 0639 10/31/20 1044   10/29/20 1000  ceFEPIme (MAXIPIME) 2 g in sodium chloride 0.9 % 100 mL IVPB  Status:  Discontinued        2 g 200 mL/hr over 30 Minutes Intravenous Every 12 hours 10/29/20 0637 11/02/20 1718   10/28/20 2000  ceFEPIme (MAXIPIME) 1 g in sodium chloride 0.9 % 100 mL IVPB  Status:  Discontinued        1 g 200 mL/hr over 30 Minutes Intravenous Every 24 hours 10/27/20 2109 10/29/20 0637   10/27/20 2100  vancomycin (VANCOCIN) IVPB 1000 mg/200 mL premix        1,000 mg 200 mL/hr over 60 Minutes Intravenous  Once 10/27/20 2057 10/28/20 0008   10/27/20 1900  ceFEPIme (MAXIPIME) 2 g in sodium chloride 0.9 % 100 mL IVPB        2 g 200 mL/hr over 30 Minutes Intravenous  Once 10/27/20 1848 10/27/20 2128   10/27/20 1900  metroNIDAZOLE (FLAGYL) IVPB 500 mg        500 mg 100 mL/hr over 60 Minutes Intravenous  Once 10/27/20 1848 10/27/20 2128   10/27/20 1900  vancomycin (VANCOCIN) IVPB 1000 mg/200 mL premix        1,000 mg 200 mL/hr over 60 Minutes Intravenous  Once 10/27/20 1848 10/27/20 2046        Subjective:  Patient seen and examined the bedside this morning.  Hemodynamically stable.  Was on dialysis.  Appears alert and oriented.  Denies any complaints today.  Objective: Vitals:   11/13/20 1130 11/13/20 1200 11/13/20 1215 11/13/20 1230  BP: (!) 94/56 113/73 (!) 89/63 (!) 122/54  Pulse: 82 86 90 88  Resp:      Temp:      TempSrc:      SpO2:  Weight:      Height:        Intake/Output Summary (Last 24 hours) at 11/13/2020 1310 Last data filed at 11/12/2020 1500 Gross per 24 hour  Intake 138.43 ml  Output --  Net 138.43 ml   Filed Weights   11/10/20 1325 11/11/20 0500 11/13/20 0855  Weight: 87.7 kg 90.5 kg 91.9 kg    Examination:    General exam: very deconditioned ,looks chronically ill HEENT: feeding tube, temporary dialysis catheter in the left neck Respiratory system:  no wheezes or crackles  Cardiovascular system: S1 & S2 heard, RRR.  Gastrointestinal system: Abdomen is nondistended, soft and nontender. Central nervous system: Alert and oriented Extremities: Bilateral BKA, gangrenous left fingertips, AV graft in the right arm Skin: Pressure ulcers as above   Data Reviewed: I have personally reviewed following labs and imaging studies  CBC: Recent Labs  Lab 11/09/20 0410 11/10/20 0522 11/11/20 0450 11/12/20 0445 11/13/20 0915  WBC 21.8* 21.8* 18.5* 19.1* 19.7*  HGB 9.3* 9.6* 8.6* 8.7* 8.5*  HCT 30.7* 31.0* 27.1* 27.7* 26.9*  MCV 79.3* 78.5* 77.4* 77.2* 76.6*  PLT 602* 705* 686* 758* 0000000*   Basic Metabolic Panel: Recent Labs  Lab 11/08/20 0334 11/08/20 1527 11/09/20 0410 11/09/20 2000 11/10/20 0522 11/10/20 2015 11/11/20 0449 11/11/20 0450 11/11/20 1632 11/12/20 0445 11/12/20 1623 11/13/20 0915  NA 134*   < > 133*   < > 134*   < > 133*  --  135 132* 137 131*  K 3.9   < > 4.2   < > 5.3*   < > 4.3  --  4.9 5.1 3.5 4.8  CL 101   < > 98   < > 100   < > 99  --  102 97* 112* 96*  CO2 24   < > 26   < > 19*   < > 26  --  22 24 15*  21*  GLUCOSE 82   < > 208*   < > 72   < > 195*  --  130* 183* 128* 192*  BUN 24*   < > 24*   < > 51*   < > 40*  --  50* 66* 54* 93*  CREATININE 2.66*   < > 2.82*   < > 4.52*   < > 4.06*  --  4.95* 5.76* 4.27* 7.31*  CALCIUM 9.3   < > 8.6*   < > 9.1   < > 8.6*  --  8.6* 8.8* 5.9* 8.7*  MG 3.4*  --  2.5*  --  2.8*  --   --  2.6*  --  2.9*  --   --   PHOS 4.6   < > 2.8   < > 4.2   < > 3.2  --  4.2 5.3* 3.4 5.4*   < > = values in this interval not displayed.   GFR: Estimated Creatinine Clearance: 8.7 mL/min (A) (by C-G formula based on SCr of 7.31 mg/dL (H)). Liver Function Tests: Recent Labs  Lab 11/11/20 0449 11/11/20 1632 11/12/20 0445 11/12/20 1623 11/13/20 0915  ALBUMIN 1.9* 1.9* 1.9* 1.2* 1.9*   No results for input(s): LIPASE, AMYLASE in the last 168 hours. No results for input(s): AMMONIA in the last 168 hours. Coagulation Profile: No results for input(s): INR, PROTIME in the last 168 hours. Cardiac Enzymes: No results for input(s): CKTOTAL, CKMB, CKMBINDEX, TROPONINI in the last 168 hours. BNP (last 3 results) No results for input(s): PROBNP in  the last 8760 hours. HbA1C: No results for input(s): HGBA1C in the last 72 hours. CBG: Recent Labs  Lab 11/12/20 1550 11/12/20 2150 11/13/20 0115 11/13/20 0449 11/13/20 0758  GLUCAP 168* 174* 152* 156* 173*   Lipid Profile: No results for input(s): CHOL, HDL, LDLCALC, TRIG, CHOLHDL, LDLDIRECT in the last 72 hours. Thyroid Function Tests: No results for input(s): TSH, T4TOTAL, FREET4, T3FREE, THYROIDAB in the last 72 hours. Anemia Panel: No results for input(s): VITAMINB12, FOLATE, FERRITIN, TIBC, IRON, RETICCTPCT in the last 72 hours. Sepsis Labs: No results for input(s): PROCALCITON, LATICACIDVEN in the last 168 hours.  No results found for this or any previous visit (from the past 240 hour(s)).        Radiology Studies: No results found.      Scheduled Meds:  amiodarone  200 mg Oral Daily   aspirin  81  mg Per Tube Daily   atorvastatin  40 mg Per Tube Daily   chlorhexidine  15 mL Mouth Rinse BID   Chlorhexidine Gluconate Cloth  6 each Topical Q0600   Chlorhexidine Gluconate Cloth  6 each Topical Q0600   clopidogrel  75 mg Per Tube Daily   collagenase   Topical Daily   darbepoetin (ARANESP) injection - DIALYSIS  100 mcg Intravenous Q Fri-HD   feeding supplement (NEPRO CARB STEADY)  1,000 mL Per Tube Q24H   feeding supplement (PROSource TF)  45 mL Per Tube QID   insulin aspart  3-9 Units Subcutaneous Q4H   mouth rinse  15 mL Mouth Rinse q12n4p   midodrine  5 mg Per Tube TID WC   multivitamin  1 tablet Oral QHS   nystatin   Topical BID   polyethylene glycol  17 g Per Tube Daily   sodium chloride flush  10-40 mL Intracatheter Q12H   Continuous Infusions:  sodium chloride Stopped (11/07/20 1143)   heparin 1,500 Units/hr (11/12/20 2221)     LOS: 17 days    Time spent: 35 mins.More than 50% of that time was spent in counseling and/or coordination of care.      Shelly Coss, MD [facility] P7/18/2022, 1:10 PM

## 2020-11-13 NOTE — Progress Notes (Signed)
Secure chatted with Dr. Cathlean Marseilles RN, and Dr. Moshe Cipro. It has been noted that this patient does not have appropriate vasculature for PIV access. Please see previous notes. Fran Lowes, RN [facility]

## 2020-11-13 NOTE — Consult Note (Addendum)
Reason for Consult:Left hand ischemia Referring Physician: Shelly Coss Time called: [id number] Time at bedside: Christy Zhang is an 65 y.o. female.  HPI: Christy Zhang was admitted 2 weeks ago with AMS. A few days into her admission she developed skin changes of the left hand. Vascular surgery was consulted and documented that radial artery was occluded but could not take her for thrombectomy for a couple of days because of pt stability. 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 She is RHD.  Past Medical History:  Diagnosis Date   Anemia    Arthritis    Asthma    in the past   CHF (congestive heart failure) ([place])    Diabetes mellitus with complication ([place])    Type 2   End stage renal disease on dialysis ([place])    Gangrene ([place])    Hyperlipidemia    Hypertension     Past Surgical History:  Procedure Laterality Date    LEFT BELOW KNEE AMPUTATION (Left Knee)  06/08/2020   A/V FISTULAGRAM Right 02/04/2017   Procedure: A/V Fistulagram;  Surgeon: Waynetta Sandy, MD;  Location: [facility];  Service: Cardiovascular;  Laterality: Right;   ABDOMINAL AORTOGRAM W/LOWER EXTREMITY N/A 11/18/2019   Procedure: ABDOMINAL AORTOGRAM W/LOWER EXTREMITY;  Surgeon: [name], MD;  Location: [facility];  Service: Cardiovascular;  Laterality: N/A;   ABDOMINAL AORTOGRAM W/LOWER EXTREMITY N/A 02/23/2020   Procedure: ABDOMINAL AORTOGRAM W/LOWER EXTREMITY;  Surgeon: [name], MD;  Location: [facility];  Service: Cardiovascular;  Laterality: N/A;  Bilateral   ABDOMINAL AORTOGRAM W/LOWER EXTREMITY N/A 08/10/2020   Procedure: ABDOMINAL AORTOGRAM W/LOWER EXTREMITY;  Surgeon: [name], MD;  Location: [facility];  Service: Cardiovascular;  Laterality: N/A;   AMPUTATION Left 06/08/2020   Procedure: LEFT BELOW KNEE AMPUTATION;  Surgeon: Cherre Robins, MD;   Location: [facility];  Service: Vascular;  Laterality: Left;   AMPUTATION Right 09/14/2020   Procedure: RIGHT BELOW KNEE AMPUTATION;  Surgeon: Waynetta Sandy, MD;  Location: [facility];  Service: Vascular;  Laterality: Right;   AV FISTULA PLACEMENT Right 08/25/2014   Procedure: RIGHT BRACHIOCEPHALIC ARTERIOVENOUS (AV) FISTULA CREATION;  Surgeon: Mal Misty, MD;  Location: [facility];  Service: Vascular;  Laterality: Right;   AV FISTULA PLACEMENT Right 03/18/2019   Procedure: INSERTION OF ARTERIOVENOUS (AV) GORE-TEX GRAFT RIGHT ARM;  Surgeon: Serafina Mitchell, MD;  Location[facility];  Service: Vascular;  Laterality: Right;   [place] Right 12/24/2018   Procedure: FIRST STAGE BASILIC VEIN TRANSPOSITION RIGHT UPPER ARM;  Surgeon: Serafina Mitchell, MD;  Location: [facility];  Service: Vascular;  Laterality: Right;   FISTULA SUPERFICIALIZATION Right 11/03/2014   Procedure: RIGHT UPPER ARM FISTULA SUPERFICIALIZATION;  Surgeon: Mal Misty, MD;  Location: [facility];  Service: Vascular;  Laterality: Right;   FISTULOGRAM Right 01/16/2016   Procedure: RIGHT UPPER EXTREMITY ARTERIOVENOUS FISTULOGRAM WITH BALLOON ANGIOPLASTY;  Surgeon: Vickie Epley, MD;  Location: [facility];  Service: Vascular;  Laterality: Right;   INSERTION OF DIALYSIS CATHETER     X4   IR FLUORO GUIDE CV LINE RIGHT  12/03/2018   IR REMOVAL TUN CV CATH W/O FL  07/06/2019   IR THROMBECTOMY AV FISTULA W/THROMBOLYSIS/PTA INC/SHUNT/IMG RIGHT Right 12/03/2018   IR THROMBECTOMY AV FISTULA W/THROMBOLYSIS/PTA/STENT INC/SHUNT/IMG RT Right 10/11/2019   IR US GUIDE VASC ACCESS  RIGHT  12/03/2018   IR US GUIDE VASC ACCESS RIGHT  12/03/2018   IR US GUIDE VASC ACCESS RIGHT  10/14/2019   LIGATION OF COMPETING BRANCHES OF ARTERIOVENOUS FISTULA Right 11/03/2014   Procedure: LIGATION OF COMPETING BRANCHE x1 OF RIGHT UPPER ARM ARTERIOVENOUS FISTULA;  Surgeon: Mal Misty, MD;  Location: [facility];  Service: Vascular;  Laterality: Right;   PERIPHERAL VASCULAR  BALLOON ANGIOPLASTY Right 02/04/2017   Procedure: PERIPHERAL VASCULAR BALLOON ANGIOPLASTY;  Surgeon: Waynetta Sandy, MD;  Location: [facility];  Service: Cardiovascular;  Laterality: Right;   PERIPHERAL VASCULAR BALLOON ANGIOPLASTY Right 11/18/2019   Procedure: PERIPHERAL VASCULAR BALLOON ANGIOPLASTY;  Surgeon: [name], MD;  Location: [facility];  Service: Cardiovascular;  Laterality: Right;  anterior tibial   PERIPHERAL VASCULAR BALLOON ANGIOPLASTY  02/23/2020   Procedure: PERIPHERAL VASCULAR BALLOON ANGIOPLASTY;  Surgeon: [name], MD;  Location: [facility];  Service: Cardiovascular;;  Lt  AT   PERIPHERAL VASCULAR INTERVENTION Right 08/10/2020   Procedure: PERIPHERAL VASCULAR INTERVENTION;  Surgeon: [name], MD;  Location: [facility];  Service: Cardiovascular;  Laterality: Right;   THROMBECTOMY BRACHIAL ARTERY Left 11/04/2020   Procedure: THROMBECTOMY RADIAL ARTERY with Bovine Patching.;  Surgeon: Cherre Robins, MD;  Location: [facility];  Service: Vascular;  Laterality: Left;   TRANSMETATARSAL AMPUTATION Left 02/24/2020   Procedure: TRANSMETATARSAL AMPUTATION LEFT;  Surgeon: Cherre Robins, MD;  Location: [facility];  Service: Vascular;  Laterality: Left;   TRANSMETATARSAL AMPUTATION Right 08/11/2020   Procedure: RIGHT TRANSMETATARSAL AMPUTATION;  Surgeon: Waynetta Sandy, MD;  Location: [facility];  Service: Vascular;  Laterality: Right;   TUBAL LIGATION      Family History  Problem Relation Age of Onset   Diabetes Mother    Hypertension Mother    Cancer Father    Diabetes Father    Hypertension Sister    Diabetes Daughter    Heart disease Daughter    Hypertension Daughter    Diabetes Son    Heart disease Son    Hypertension Son    Peripheral vascular disease Son        amputation    Social History:  reports that she quit smoking about 36 years ago. Her smoking use included cigarettes. She has never used  smokeless tobacco. She reports that she does not drink alcohol and does not use drugs.  Allergies:  Allergies  Allergen Reactions   Sulfa Antibiotics Swelling    Medications: I have reviewed the patient's current medications.  Results for orders placed or performed during the hospital encounter of 10/27/20 (from the past 48 hour(s))  Glucose, capillary     Status: Abnormal   Collection Time: 11/11/20 11:45 AM  Result Value Ref Range   Glucose-Capillary 154 (H) 70 - 99 mg/dL    Comment: Glucose reference range applies only to samples taken after fasting for at least 8 hours.  Glucose, capillary     Status: None   Collection Time: 11/11/20  3:12 PM  Result Value Ref Range   Glucose-Capillary 95 70 - 99 mg/dL    Comment: Glucose reference range applies only to samples taken after fasting for at least 8 hours.  Renal function panel (daily at 1600)     Status: Abnormal   Collection Time: 11/11/20  4:32 PM  Result Value Ref Range   Sodium 135 135 - 145 mmol/L   Potassium 4.9 3.5 - 5.1 mmol/L   Chloride 102  98 - 111 mmol/L   CO2 22 22 - 32 mmol/L   Glucose, Bld 130 (H) 70 - 99 mg/dL    Comment: Glucose reference range applies only to samples taken after fasting for at least 8 hours.   BUN 50 (H) 8 - 23 mg/dL   Creatinine, Ser 4.95 (H) 0.44 - 1.00 mg/dL   Calcium 8.6 (L) 8.9 - 10.3 mg/dL   Phosphorus 4.2 2.5 - 4.6 mg/dL   Albumin 1.9 (L) 3.5 - 5.0 g/dL   GFR, Estimated 9 (L) >60 mL/min    Comment: (NOTE) Calculated using the CKD-EPI Creatinine Equation (2021)    Anion gap 11 5 - 15    Comment: Performed at [facility] [address] [place], [place] [address]  Glucose, capillary     Status: Abnormal   Collection Time: 11/11/20  7:36 PM  Result Value Ref Range   Glucose-Capillary 114 (H) 70 - 99 mg/dL    Comment: Glucose reference range applies only to samples taken after fasting for at least 8 hours.  Glucose, capillary     Status: Abnormal   Collection Time: 11/12/20   1:22 AM  Result Value Ref Range   Glucose-Capillary 173 (H) 70 - 99 mg/dL    Comment: Glucose reference range applies only to samples taken after fasting for at least 8 hours.  Glucose, capillary     Status: Abnormal   Collection Time: 11/12/20  4:38 AM  Result Value Ref Range   Glucose-Capillary 167 (H) 70 - 99 mg/dL    Comment: Glucose reference range applies only to samples taken after fasting for at least 8 hours.  Magnesium     Status: Abnormal   Collection Time: 11/12/20  4:45 AM  Result Value Ref Range   Magnesium 2.9 (H) 1.7 - 2.4 mg/dL    Comment: Performed at [facility] [address], [place], [place] [address]  CBC     Status: Abnormal   Collection Time: 11/12/20  4:45 AM  Result Value Ref Range   WBC 19.1 (H) 4.0 - 10.5 K/uL   RBC 3.59 (L) 3.87 - 5.11 MIL/uL   Hemoglobin 8.7 (L) 12.0 - 15.0 g/dL    Comment: Reticulocyte Hemoglobin testing may be clinically indicated, consider ordering this additional test [id number]    HCT 27.7 (L) 36.0 - 46.0 %   MCV 77.2 (L) 80.0 - 100.0 fL   MCH 24.2 (L) 26.0 - 34.0 pg   MCHC 31.4 30.0 - 36.0 g/dL   RDW 21.0 (H) 11.5 - 15.5 %   Platelets 758 (H) 150 - 400 K/uL   nRBC 0.0 0.0 - 0.2 %    Comment: Performed at [facility], [place] [address], [place], [place] [address]  Heparin level (unfractionated)     Status: None   Collection Time: 11/12/20  4:45 AM  Result Value Ref Range   Heparin Unfractionated 0.36 0.30 - 0.70 IU/mL    Comment: (NOTE) The clinical reportable range upper limit is being lowered to >1.10 to align with the FDA approved guidance for the current laboratory assay.  If heparin results are below expected values, and patient dosage has  been confirmed, suggest follow up testing of antithrombin III levels. Performed at [facility], [place] [address], [place], [place] [address]   Renal function panel     Status: Abnormal   Collection Time: 11/12/20  4:45 AM  Result Value Ref Range   Sodium 132  (L)  135 - 145 mmol/L   Potassium 5.1 3.5 - 5.1 mmol/L   Chloride 97 (L) 98 - 111 mmol/L   CO2 24 22 - 32 mmol/L   Glucose, Bld 183 (H) 70 - 99 mg/dL    Comment: Glucose reference range applies only to samples taken after fasting for at least 8 hours.   BUN 66 (H) 8 - 23 mg/dL   Creatinine, Ser 5.76 (H) 0.44 - 1.00 mg/dL   Calcium 8.8 (L) 8.9 - 10.3 mg/dL   Phosphorus 5.3 (H) 2.5 - 4.6 mg/dL   Albumin 1.9 (L) 3.5 - 5.0 g/dL   GFR, Estimated 8 (L) >60 mL/min    Comment: (NOTE) Calculated using the CKD-EPI Creatinine Equation (2021)    Anion gap 11 5 - 15    Comment: Performed at [facility] [address], [place], [place] [address]  Glucose, capillary     Status: Abnormal   Collection Time: 11/12/20  7:52 AM  Result Value Ref Range   Glucose-Capillary 206 (H) 70 - 99 mg/dL    Comment: Glucose reference range applies only to samples taken after fasting for at least 8 hours.  Glucose, capillary     Status: Abnormal   Collection Time: 11/12/20 12:36 PM  Result Value Ref Range   Glucose-Capillary 104 (H) 70 - 99 mg/dL    Comment: Glucose reference range applies only to samples taken after fasting for at least 8 hours.  Glucose, capillary     Status: Abnormal   Collection Time: 11/12/20  3:50 PM  Result Value Ref Range   Glucose-Capillary 168 (H) 70 - 99 mg/dL    Comment: Glucose reference range applies only to samples taken after fasting for at least 8 hours.  Renal function panel (daily at 1600)     Status: Abnormal   Collection Time: 11/12/20  4:23 PM  Result Value Ref Range   Sodium 137 135 - 145 mmol/L   Potassium 3.5 3.5 - 5.1 mmol/L   Chloride 112 (H) 98 - 111 mmol/L   CO2 15 (L) 22 - 32 mmol/L   Glucose, Bld 128 (H) 70 - 99 mg/dL    Comment: Glucose reference range applies only to samples taken after fasting for at least 8 hours.   BUN 54 (H) 8 - 23 mg/dL   Creatinine, Ser 4.27 (H) 0.44 - 1.00 mg/dL   Calcium 5.9 (LL) 8.9 - 10.3 mg/dL    Comment: CRITICAL RESULT  CALLED TO, READ BACK BY AND VERIFIED WITH: G.KIGER RN @ 1708 11/12/2020 BY C.EDENS    Phosphorus 3.4 2.5 - 4.6 mg/dL   Albumin 1.2 (L) 3.5 - 5.0 g/dL   GFR, Estimated 11 (L) >60 mL/min    Comment: (NOTE) Calculated using the CKD-EPI Creatinine Equation (2021)    Anion gap 10 5 - 15    Comment: Performed at [facility] [address] [place], [place] [address]  Glucose, capillary     Status: Abnormal   Collection Time: 11/12/20  9:50 PM  Result Value Ref Range   Glucose-Capillary 174 (H) 70 - 99 mg/dL    Comment: Glucose reference range applies only to samples taken after fasting for at least 8 hours.  Glucose, capillary     Status: Abnormal   Collection Time: 11/13/20  1:15 AM  Result Value Ref Range   Glucose-Capillary 152 (H) 70 - 99 mg/dL    Comment: Glucose reference range applies only to samples taken after fasting for at least  8 hours.  Glucose, capillary     Status: Abnormal   Collection Time: 11/13/20  4:49 AM  Result Value Ref Range   Glucose-Capillary 156 (H) 70 - 99 mg/dL    Comment: Glucose reference range applies only to samples taken after fasting for at least 8 hours.  Glucose, capillary     Status: Abnormal   Collection Time: 11/13/20  7:58 AM  Result Value Ref Range   Glucose-Capillary 173 (H) 70 - 99 mg/dL    Comment: Glucose reference range applies only to samples taken after fasting for at least 8 hours.  Heparin level (unfractionated)     Status: Abnormal   Collection Time: 11/13/20  9:15 AM  Result Value Ref Range   Heparin Unfractionated 0.29 (L) 0.30 - 0.70 IU/mL    Comment: (NOTE) The clinical reportable range upper limit is being lowered to >1.10 to align with the FDA approved guidance for the current laboratory assay.  If heparin results are below expected values, and patient dosage has  been confirmed, suggest follow up testing of antithrombin III levels. Performed at [facility], [place] [address], [place], [place] [address]   CBC      Status: Abnormal   Collection Time: 11/13/20  9:15 AM  Result Value Ref Range   WBC 19.7 (H) 4.0 - 10.5 K/uL   RBC 3.51 (L) 3.87 - 5.11 MIL/uL   Hemoglobin 8.5 (L) 12.0 - 15.0 g/dL    Comment: Reticulocyte Hemoglobin testing may be clinically indicated, consider ordering this additional test [id number]    HCT 26.9 (L) 36.0 - 46.0 %   MCV 76.6 (L) 80.0 - 100.0 fL   MCH 24.2 (L) 26.0 - 34.0 pg   MCHC 31.6 30.0 - 36.0 g/dL   RDW 21.0 (H) 11.5 - 15.5 %   Platelets 810 (H) 150 - 400 K/uL   nRBC 0.0 0.0 - 0.2 %    Comment: Performed at [facility], [place] [address], [place], [place] [address]  Renal function panel     Status: Abnormal   Collection Time: 11/13/20  9:15 AM  Result Value Ref Range   Sodium 131 (L) 135 - 145 mmol/L   Potassium 4.8 3.5 - 5.1 mmol/L    Comment: NO VISIBLE HEMOLYSIS   Chloride 96 (L) 98 - 111 mmol/L   CO2 21 (L) 22 - 32 mmol/L   Glucose, Bld 192 (H) 70 - 99 mg/dL    Comment: Glucose reference range applies only to samples taken after fasting for at least 8 hours.   BUN 93 (H) 8 - 23 mg/dL   Creatinine, Ser 7.31 (H) 0.44 - 1.00 mg/dL    Comment: DIALYSIS   Calcium 8.7 (L) 8.9 - 10.3 mg/dL    Comment: DELTA CHECK NOTED   Phosphorus 5.4 (H) 2.5 - 4.6 mg/dL   Albumin 1.9 (L) 3.5 - 5.0 g/dL   GFR, Estimated 6 (L) >60 mL/min    Comment: (NOTE) Calculated using the CKD-EPI Creatinine Equation (2021)    Anion gap 14 5 - 15    Comment: Performed at [facility] [address], [place], [place] [address]    No results found.  Review of Systems  HENT:  Negative for ear discharge, ear pain, hearing loss and tinnitus.   Eyes:  Negative for photophobia and pain.  Respiratory:  Negative for cough and shortness of breath.   Cardiovascular:  Negative for chest pain.  Gastrointestinal:  Negative for abdominal pain,  nausea and vomiting.  Genitourinary:  Negative for dysuria, flank pain, frequency and urgency.  Musculoskeletal:  Negative for  arthralgias, back pain, myalgias and neck pain.  Neurological:  Negative for dizziness and headaches.  Hematological:  Does not bruise/bleed easily.  Psychiatric/Behavioral:  The patient is not nervous/anxious.   Blood pressure (!) 94/56, pulse 82, temperature 97.6 F (36.4 C), temperature source Oral, resp. rate (!) 24, height '5\' 4"'$  (1.626 m), weight 91.9 kg, SpO2 (!) 89 %. Physical Exam Constitutional:      General: She is not in acute distress.    Appearance: She is well-developed. She is not diaphoretic.  HENT:     Head: Normocephalic and atraumatic.  Eyes:     General: No scleral icterus.       Right eye: No discharge.        Left eye: No discharge.     Conjunctiva/sclera: Conjunctivae normal.  Cardiovascular:     Rate and Rhythm: Normal rate and regular rhythm.  Pulmonary:     Effort: Pulmonary effort is normal. No respiratory distress.  Musculoskeletal:     Cervical back: Normal range of motion.     Comments: Left shoulder, elbow, wrist, digits- Dry gangrene tips of thumb, index, and little fingers, ischemic changes mostly thenar region both palmar and dorsal, thumb insensate, index mostly insensate, 3-5 paresthetic, nontender, no instability, no blocks to motion  Sens  Ax/R/M/U intact  Mot   Ax/ R/ PIN/ M/ AIN/ U intact  Rad 1+  Skin:    General: Skin is warm and dry.  Neurological:     Mental Status: She is alert.  Psychiatric:        Mood and Affect: Mood normal.        Behavior: Behavior normal.    Assessment/Plan: Left hand ischemia -- Will need amputations of fingertips at some point. Will probably need to wait and see how the rest of the ischemic changes of the hand declare themselves to avoid multiple surgeries. As her hand is not painful there is no rush. Dr. [place] Silos to evaluate at later time.    Lisette Abu, PA-C Orthopedic Surgery [phone] 11/13/2020, 11:40 AM   History and hospitalization events reviewed.  I personally interviewed and  examined this patient.  Although there is more advanced dry gangrene of the tips of the left thumb, index, and small fingers, there are also evolving ischemic skin changes on several other parts of the hand both palmarly and dorsally.  I recommend continued observation to discern the fate of these at risk areas, rather than promptly proceeding with simply terminal amputations of the thumb and index and small fingers.  We will plan to reevaluate her as an outpatient.  Please reconsult if circumstances or condition dramatically changes while still hospitalized.  Milly Jakob, MD Hand Surgery

## 2020-11-13 NOTE — Consult Note (Addendum)
WOC Nurse Consult Note: Reason for Consult: Consult requested for sacrum, left hip, middle back.  Wound type: Left hip with linear dark red-purple deep tissue pressure injury; 1X4cm Left hip with partial thickness abrasion; loose peeling skin surrounding red moist wound bed, 2X4X.1cm Middle back with Unstageable pressure injury; 1X1cm, 100% slough, small amt tan drainage, no fluctuance Sacrum with Unstageable pressure injury; 10X6cm, 25% red, 75% slough, mod amt tan drainage, no odor Pressure Injury POA: Yes; sacrum wound was present on admission according to nursing flow sheet.  Back and left hip pressure injuries were not. Air mattress to reduce pressure. Topical treatment orders provided for bedside nurses to perform as follows for enzymatic debridement of nonviable tissue: Santyl ointment to sacrum and middle back wounds Q day, then cover with moist gauze and foam dressing.  (Change foam dressing Q 3 days or PRN soiling.) Foam dressing to left hip and flank, change Q 3 days or PRN soiling. Please re-consult if further assistance is needed.  Thank-you,  Julien Girt MSN, [place], [place], [place], [place]

## 2020-11-13 NOTE — Progress Notes (Signed)
PT Cancellation Note  Patient Details Name: Christy Zhang MRN: [id number] DOB: 01/16/1956   Cancelled Treatment:    Reason Eval/Treat Not Completed: Patient at procedure or test/unavailable.  Pt at HD.  Will check back later as time allows.   Thanks,  Verdene Lennert, PT, DPT  Acute Rehabilitation Ortho Tech Supervisor [phone] pager #[phone] office      [name] 11/13/2020, 9:18 AM

## 2020-11-14 DIAGNOSIS — J189 Pneumonia, unspecified organism: Secondary | ICD-10-CM | POA: Diagnosis not present

## 2020-11-14 LAB — RENAL FUNCTION PANEL
Albumin: 1.9 g/dL — ABNORMAL LOW (ref 3.5–5.0)
Anion gap: 12 (ref 5–15)
BUN: 44 mg/dL — ABNORMAL HIGH (ref 8–23)
CO2: 26 mmol/L (ref 22–32)
Calcium: 9 mg/dL (ref 8.9–10.3)
Chloride: 98 mmol/L (ref 98–111)
Creatinine, Ser: 4.81 mg/dL — ABNORMAL HIGH (ref 0.44–1.00)
GFR, Estimated: 10 mL/min — ABNORMAL LOW (ref >60)
Glucose, Bld: 116 mg/dL — ABNORMAL HIGH (ref 70–99)
Phosphorus: 4.4 mg/dL (ref 2.5–4.6)
Potassium: 5.4 mmol/L — ABNORMAL HIGH (ref 3.5–5.1)
Sodium: 136 mmol/L (ref 135–145)

## 2020-11-14 LAB — CBC
HCT: 27.8 % — ABNORMAL LOW (ref 36.0–46.0)
Hemoglobin: 8.6 g/dL — ABNORMAL LOW (ref 12.0–15.0)
MCH: 24 pg — ABNORMAL LOW (ref 26.0–34.0)
MCHC: 30.9 g/dL (ref 30.0–36.0)
MCV: 77.4 fL — ABNORMAL LOW (ref 80.0–100.0)
Platelets: 888 10*3/uL — ABNORMAL HIGH (ref 150–400)
RBC: 3.59 MIL/uL — ABNORMAL LOW (ref 3.87–5.11)
RDW: 20.9 % — ABNORMAL HIGH (ref 11.5–15.5)
WBC: 18.9 10*3/uL — ABNORMAL HIGH (ref 4.0–10.5)
nRBC: 0 % (ref 0.0–0.2)

## 2020-11-14 LAB — GLUCOSE, CAPILLARY
Glucose-Capillary: 107 mg/dL — ABNORMAL HIGH (ref 70–99)
Glucose-Capillary: 114 mg/dL — ABNORMAL HIGH (ref 70–99)
Glucose-Capillary: 167 mg/dL — ABNORMAL HIGH (ref 70–99)

## 2020-11-14 LAB — HEPARIN LEVEL (UNFRACTIONATED): Heparin Unfractionated: 0.32 IU/mL (ref 0.30–0.70)

## 2020-11-14 MED ORDER — ACETAMINOPHEN 325 MG PO TABS
650.0000 mg | ORAL_TABLET | Freq: Four times a day (QID) | ORAL | Status: DC | PRN
  Administered 2020-11-14: 650 mg via ORAL
  Filled 2020-11-14 (×2): qty 2

## 2020-11-14 MED ORDER — APIXABAN 5 MG PO TABS
5.0000 mg | ORAL_TABLET | Freq: Two times a day (BID) | ORAL | Status: DC
  Administered 2020-11-14: 5 mg via ORAL
  Filled 2020-11-14: qty 1

## 2020-11-14 MED ORDER — MIDODRINE HCL 5 MG PO TABS: 5.0000 mg | ORAL_TABLET | Freq: Three times a day (TID) | ORAL | 0 refills | Status: AC

## 2020-11-14 MED ORDER — ATORVASTATIN CALCIUM 40 MG PO TABS: 40.0000 mg | ORAL_TABLET | Freq: Every day | ORAL | Status: DC

## 2020-11-14 MED ORDER — ASPIRIN 81 MG PO CHEW: 81.0000 mg | CHEWABLE_TABLET | Freq: Every day | ORAL | Status: DC

## 2020-11-14 MED ORDER — APIXABAN 5 MG PO TABS: 5.0000 mg | ORAL_TABLET | Freq: Two times a day (BID) | ORAL | 1 refills | Status: AC

## 2020-11-14 MED ORDER — SODIUM ZIRCONIUM CYCLOSILICATE 5 G PO PACK
5.0000 g | PACK | Freq: Once | ORAL | Status: AC
  Administered 2020-11-14: 5 g via ORAL
  Filled 2020-11-14: qty 1

## 2020-11-14 MED ORDER — MIDODRINE HCL 5 MG PO TABS
5.0000 mg | ORAL_TABLET | Freq: Three times a day (TID) | ORAL | Status: DC
  Administered 2020-11-14: 5 mg via ORAL
  Filled 2020-11-14: qty 1

## 2020-11-14 MED ORDER — POLYETHYLENE GLYCOL 3350 17 G PO PACK: 17.0000 g | PACK | Freq: Every day | ORAL | Status: DC

## 2020-11-14 MED ORDER — AMIODARONE HCL 200 MG PO TABS: 200.0000 mg | ORAL_TABLET | Freq: Every day | ORAL | 0 refills | Status: AC

## 2020-11-14 MED ORDER — CLOPIDOGREL BISULFATE 75 MG PO TABS: 75.0000 mg | ORAL_TABLET | Freq: Every day | ORAL | Status: DC

## 2020-11-14 NOTE — Progress Notes (Signed)
Instructed patient on procedure. HOB less than 45*. Pt held breath upon line removal. Pressure held for 5+min with no s/sx of bleeding. Pressure drsg applied and instructed patient to remain in bed for 30+min and keep drsg CDI for 24 hours. Instructed to notify nurse if noting any bleeding. VU. Fran Lowes, RN VAST

## 2020-11-14 NOTE — Progress Notes (Signed)
ANTICOAGULATION CONSULT NOTE - Follow Up Consult  Pharmacy Consult for Heparin > Apixaban Indication: atrial fibrillation  Allergies  Allergen Reactions   Sulfa Antibiotics Swelling    Patient Measurements: Height: '5\' 4"'$  (162.6 cm) Weight: 90 kg (198 lb 6.6 oz) IBW/kg (Calculated) : 54.7 Heparin Dosing Weight: 90 kg  Vital Signs: Temp: 98 F (36.7 C) (07/19 0700) Temp Source: Oral (07/19 0700) BP: 167/61 (07/19 0700) Pulse Rate: 87 (07/19 0700)  Labs: Recent Labs    11/12/20 0445 11/12/20 1623 11/13/20 0915 11/13/20 1537 11/14/20 0500  HGB 8.7*  --  8.5*  --  8.6*  HCT 27.7*  --  26.9*  --  27.8*  PLT 758*  --  810*  --  888*  HEPARINUNFRC 0.36  --  0.29*  --  0.32  CREATININE 5.76*   < > 7.31* 3.72* 4.81*   < > = values in this interval not displayed.   Assessment: 65 yo W on heparin for new afib. No anticoagulation prior to admission. Pharmacy consulted for heparin and now to transition to Apixaban.  PVD, s/p left radial artery thrombectomy with patch angioplasty on 11/04/20 and noted several fingertips with dry gangrene. May need amputations and evaluated by hand surgeon 11/13/20. Observing for now to discern fate of these areas. Currently planning outpatient follow up.   Goal of Therapy:  Heparin level 0.3-0.7 units/ml Appropriate Apixaban dose for indication Monitor platelets by anticoagulation protocol: Yes   Plan:   Begin Apixaban 5 mg PO BID.  Stop IV heparin when giving first Apixaban dose.  Monitor for signs/symptoms of bleeding.  Also on Aspirin 81 mg and Plavix 75 mg daily.  Arty Baumgartner, RPh 11/14/2020,9:27 AM

## 2020-11-14 NOTE — Progress Notes (Signed)
[facility] NEPHROLOGY PROGRESS NOTE  Assessment/ Plan: Pt is a 65 y.o. yo female  with history of HTN, HLD, peripheral vascular disease status post bilateral BKA, DM, anemia, ESRD on HD MWF at [facility] presented with cough, shortness of breath and generalized weakness.  Dialysis Orders from previous admission:  [facility] - MWF 4 hrs 400/500 97 kg 2.0K/2.5 Ca 16g needles -Heparin 1000 units initial bolus-heparin pump 1000 units/hr stop 1 hour prior to end of tx  -Epogen 14000 units IV TIW -Hectorol 1.5 mcg IV TIW  # ESRD MWF schedule.  S/p CRRT, stopped 7/12.  Difficulty using her fistula NOS Wed but worked ok Friday. and today.  Duplex of AVF ok.  Plan next HD Wednesday using AVF. Keeping temp HD cath in for IV access  #Septic shock due to left lung pneumonia: resolved s/p course of abx  #Thrombosed left radial artery/left hand ischemia -2/2 previous a line and pressors -s/p left radial artery thrombectomy and angio 7/9 w/ VVS Recommending continued observation  #Possible inferior STEMI -not a candidate for intervention currently. Per cardiology  #A. fib with RVR: Currently on amiodarone and heparin.  Appears plans for conversion from heparin to eliquis -  Cardiology is following.   # Anemia: Hemoglobin 8.7  today.  Continue to monitor.  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  Making moves toward discharge-  starting eliquis   Objective Vital signs in last 24 hours: Vitals:   11/13/20 1306 11/13/20 1931 11/14/20 0501 11/14/20 0700  BP:  132/64 (!) 165/64 (!) 167/61  Pulse: 83 83 82 87  Resp:  18  (!) 22  Temp: 98.3 F (36.8 C) 98.7 F (37.1 C) 98.6 F (37 C) 98 F (36.7 C)   TempSrc: Oral  Oral Oral  SpO2: 98%  99% 99%  Weight: 90 kg     Height:       Weight change:   Intake/Output Summary (Last 24 hours) at 11/14/2020 0840 Last data filed at 11/13/2020 1306 Gross per 24 hour  Intake --  Output 2000 ml  Net -2000 ml     Labs: Basic Metabolic Panel: Recent Labs  Lab 11/13/20 0915 11/13/20 1537 11/14/20 0500  NA 131* 134* 136  K 4.8 4.8 5.4*  CL 96* 99 98  CO2 21* 23 26  GLUCOSE 192* 126* 116*  BUN 93* 35* 44*  CREATININE 7.31* 3.72* 4.81*  CALCIUM 8.7* 8.3* 9.0  PHOS 5.4* 2.8 4.4   Liver Function Tests: Recent Labs  Lab 11/13/20 0915 11/13/20 1537 11/14/20 0500  ALBUMIN 1.9* 2.0* 1.9*   No results for input(s): LIPASE, AMYLASE in the last 168 hours. No results for input(s): AMMONIA in the last 168 hours. CBC: Recent Labs  Lab 11/10/20 0522 11/11/20 0450 11/12/20 0445 11/13/20 0915 11/14/20 0500  WBC 21.8* 18.5* 19.1* 19.7* 18.9*  HGB 9.6* 8.6* 8.7* 8.5* 8.6*  HCT 31.0* 27.1* 27.7* 26.9* 27.8*  MCV 78.5* 77.4* 77.2* 76.6* 77.4*  PLT 705* 686* 758* 810* 888*   Cardiac Enzymes: No results for input(s): CKTOTAL, CKMB, CKMBINDEX, TROPONINI in the last 168 hours. CBG: Recent Labs  Lab 11/13/20 1706 11/13/20 1929 11/13/20 2302 11/14/20 0455 11/14/20 0741  GLUCAP  159* 106* 129* 107* 114*    Iron Studies: No results for input(s): IRON, TIBC, TRANSFERRIN, FERRITIN in the last 72 hours. Studies/Results: No results found.  Medications: Infusions:  sodium chloride Stopped (11/07/20 1143)    Scheduled Medications:  (feeding supplement) PROSource Plus  30 mL Oral TID WC   amiodarone  200 mg Oral Daily   aspirin  81 mg Per Tube Daily   atorvastatin  40 mg Per Tube Daily   chlorhexidine  15 mL Mouth Rinse BID   Chlorhexidine Gluconate Cloth  6 each Topical Q0600   Chlorhexidine Gluconate Cloth  6 each Topical Q0600   clopidogrel  75 mg Per Tube Daily   collagenase   Topical Daily   darbepoetin (ARANESP) injection -  DIALYSIS  100 mcg Intravenous Q Fri-HD   insulin aspart  3-9 Units Subcutaneous Q4H   mouth rinse  15 mL Mouth Rinse q12n4p   midodrine  5 mg Per Tube TID WC   multivitamin  1 tablet Oral QHS   nystatin   Topical BID   polyethylene glycol  17 g Per Tube Daily   sodium chloride flush  10-40 mL Intracatheter Q12H    have reviewed scheduled and prn medications.  Physical Exam: General: Chronically ill appearing, but in no distress Heart:RRR, s1s2 nl Lungs: Sudley, b/l chest expansion Abdomen:soft,  non-distended Extremities: Bilateral BKA, no stump/thigh edema, R stump with scab no erythema or drainage; left hand with several necrotic digits and hand with patchy duskiness that is stable Neuro: awake, following commands Dialysis Access: Left IJ temporary trialysis catheter c/d/i.  AV fistula has + thrill and bruit-  being used for HD today   Ellenor Wisniewski A Aliena Ghrist 11/14/2020,8:40 AM  LOS: 18 days

## 2020-11-14 NOTE — Discharge Summary (Signed)
Physician Discharge Summary  Christy Zhang [id number] DOB: 30-Oct-1955 DOA: 10/27/2020  PCP: Claretta Fraise, MD  Admit date: 10/27/2020 Discharge date: 11/14/2020  Admitted From: Home Disposition:  Home  Discharge Condition:Stable CODE STATUS:Partial Diet recommendation: Renal  Brief/Interim Summary:  Patient is a 65 old female with history of bilateral BKA, ESRD on dialysis on Monday/Wednesday/Friday, type 2 diabetes, hypertension, peripheral vascular disease, hyperlipidemia who presented to the emergency department for the evaluation of altered mental status.  She was complaining of 2-week history of intermittent shortness of breath, dry cough, fatigue, weakness.  On presentation she was hypertensive.  Lab work showed leukocytosis, hyponatremia, hyperglycemia, creatinine of 6.4, lactic acid of 2.9.  Chest x-ray showed acute consolidation/pneumonia with fullness.  Patient was started on Rocephin antibiotics for septic shock, started on IV fluids.  She was intubated for airway protection, put on mechanical ventilation.  She also was started on CRRT on 7/3 in the setting of septic shock from pneumonia causing acute respiratory failure with hypoxia and AKI.  Hospital course remarkable for new onset A. fib, cardiology were following. Overall, she has significantly improved. Feeding tube removed .PT/OT has recommended home health.  She is medically stable for discharge today.    Following problems were addressed during her hospitalization:   Septic shock: Resolved.  Blood pressure better.  Pressors were weaned  on 7/11. Started on midodrine.  Blood culture did not show any growth   Acute respiratory failure with hypoxia/hypercarbia: Secondary to severe community-acquired pneumonia.  Was intubated and on mechanical ventilation.  Now extubated.  PCCM were following.  She finished a course of antibiotics.  Currently on room air   ESRD on hemodialysis: Nephrology following.  CRRT stopped on 7/12.   Underwent dialysis here.  Previous AVG was not usable for hemodialysis so IR consulted for fistulogram.  Ultrasound duplex of the graft did not show any thrombosis or restenosis, graft again being used.     Parapneumonic effusion: Drained, chest tube has been taken out   New onset A. fib/flutter: Currently in normal sinus rhythm.  On oral amiodarone.  Started on eliquis   Elevated troponin: Most likely secondary to demand ischemia in the setting of sepsis.  Troponins were flat trend.  2D echo showed normal left ventricular function.  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  Continue local wound care.  She may need amputation of left fingertips, orthopedics were following.  Vascular surgery was also following.  Orthopedics recommended outpatient follow-up   Hyperlipidemia: Continue statin   Leukocytosis/thrombocytosis: On reviewing her previous records, she always has chronic leukocytosis along with thrombocytosis.  Could not rule out hematological pathology.  I sent a message to Dr. Germain Osgood  outpatient follow-up   Normocytic anemia: Currently hemoglobin stable.  Continue to monitor. Associated with ESRD   Diabetes type 2:   Monitor blood sugars.  Continue home regimen   Weakness/deconditioning: PT/OT consulted.  Recommended home health PT on discharge   Dysphagia: Speech therapy following,She was active feeding for a long period of time.  On the day of discharge, feeding tube removed, she exhibited normal swallowing function ,so she will be discharged on solid diet.   Suspected left lower pole renal neoplasm: Work-up as an outpatient recommended.   Goals of care: Multiple comorbidities.  Palliative care consulted and following and recommended limited code  Discharge Diagnoses:  Principal Problem:   CAP (community acquired pneumonia) Active  Problems:   Anemia of chronic disease   Essential (primary) hypertension   Hyperglycemia due to diabetes mellitus (HCC)   Leucocytosis   Thrombocytosis   ESRD on hemodialysis (HCC)   PVD (peripheral vascular disease)/ S/p BKA   Altered mental status   Acute respiratory failure with hypoxemia (HCC)   Lactic acidosis   Hypoalbuminemia   Hyponatremia   Hyperlipidemia   Cardiac arrest (HCC)   Paroxysmal atrial fibrillation [facility])    Discharge Instructions  Discharge Instructions     Diet - low sodium heart healthy   Complete by: As directed    Discharge instructions   Complete by: As directed    1)Please follow up with your PCP in a week.  Follow-up with home health 2)Take prescribed medications as instructed 3)Undergo dialysis as scheduled 4)You will be called by cardiology and hematology for the follow-up as an outpatient   Discharge wound care:   Complete by: As directed    As above   Increase activity slowly   Complete by: As directed       Allergies as of 11/14/2020       Reactions   Sulfa Antibiotics Swelling        Medication List     STOP taking these medications    amLODipine 5 MG tablet Commonly known as: NORVASC   metoprolol succinate 50 MG 24 hr tablet Commonly known as: TOPROL-XL   multivitamin Tabs tablet       TAKE these medications    acetaminophen 325 MG tablet Commonly known as: TYLENOL Take 1-2 tablets (325-650 mg total) by mouth every 4 (four) hours as needed for mild pain.   amiodarone 200 MG tablet Commonly known as: PACERONE Take 1 tablet (200 mg total) by mouth daily for 19 days. Start taking on: November 15, 2020   apixaban 5 MG Tabs tablet Commonly known as: ELIQUIS Take 1 tablet (5 mg total) by mouth 2 (two) times daily.   aspirin EC 81 MG tablet Take 81 mg by mouth at bedtime. Swallow whole.   atorvastatin 40 MG tablet Commonly known as: LIPITOR TAKE ONE TABLET BY MOUTH DAILY What changed:  how much to take how to  take this when to take this   clopidogrel 75 MG tablet Commonly known as: Plavix Take 1 tablet (75 mg total) by mouth daily. What changed: when to take this   diphenhydrAMINE 25 MG tablet Commonly known as: BENADRYL Take 25 mg by mouth every 6 (six) hours as needed for itching.   gabapentin 300 MG capsule Commonly known as: NEURONTIN Take 1 capsule (300 mg total) by mouth at bedtime.   insulin detemir 100 UNIT/ML injection Commonly known as: Levemir Inject 0.16 mLs (16 Units total) into the skin daily.   Insulin Syringe-Needle U-100 28G X 1/2" 1 ML Misc Use with insulin daily Dx E11.22   midodrine 5 MG tablet Commonly known as: PROAMATINE Take 1 tablet (5 mg total) by mouth 3 (three) times daily with meals.   oxyCODONE 5 MG immediate release tablet Commonly known as: Oxy IR/ROXICODONE Take 1 tablet (5 mg total) by mouth every 6 (six) hours as needed for severe pain.   polyethylene glycol 17 g packet Commonly known as: MIRALAX / GLYCOLAX Take 17 g by mouth daily.   senna-docusate 8.6-50 MG tablet Commonly known as: Senokot-S Take 1 tablet by mouth 2 (two) times daily. What changed:  when to take this reasons to take this   sevelamer carbonate 800 MG tablet Commonly  known as: RENVELA Take 1,600-2,400 mg by mouth See admin instructions. Take 2400 mg with each meal and 1600 mg with each snack   Trulicity 1.5 0000000 Sopn Generic drug: Dulaglutide INJECT CONTENTs OF ONE PEN UNDER THE SKIN WEEKLY ON SATURDAY What changed: See the new instructions.               Discharge Care Instructions  (From admission, onward)           Start     Ordered   11/14/20 0000  Discharge wound care:       Comments: As above   11/14/20 1209            Follow-up Information     Milly Jakob, MD Follow up.   Specialty: Orthopedic Surgery Why: Call to make an appointment for your left hand following discharge from the hospital Contact information: [place] [address] [place] Follow up.   Why: The home health agency will contact you for the next home visit Contact information: [phone]        Claretta Fraise, MD. Schedule an appointment as soon as possible for a visit in 1 week(s).   Specialty: Family Medicine Contact information: [place] [address] [phone]                Allergies  Allergen Reactions   Sulfa Antibiotics Swelling    Consultations: Cardiology., PCCM, vascular surgery, orthopedics, nephrology   Procedures/Studies: DG Chest 1 View  Result Date: 10/29/2020 CLINICAL DATA:  Cardiac arrest. EXAM: CHEST  1 VIEW COMPARISON:  Earlier today. FINDINGS: Endotracheal tube tip 2.4 cm from the carina. Enteric tube in place with tip and side-port below the diaphragm. Left-sided dialysis catheter is left central line in the SVC. Stable cardiomegaly. Unchanged mediastinal contours. Improving aeration with decreasing bilateral pulmonary opacities. Improving bilateral pleural effusions, left greater than right. Persistent retrocardiac opacity. No pneumothorax. Vascular stent in the right axilla. IMPRESSION: 1. Endotracheal tube tip 2.4 cm from the carina. Stable enteric tube and left central line. 2. Improving aeration with decreasing bilateral pulmonary opacities, likely improving pulmonary edema. Improving pleural effusions. Persistent dense retrocardiac opacity. 3. Stable cardiomegaly. Electronically Signed   By: Keith Rake M.D.   On: 10/29/2020 16:11   DG Abd 1 View  Result Date: 11/04/2020 CLINICAL DATA:  Check gastric catheter placement EXAM: ABDOMEN - 1 VIEW COMPARISON:  10/29/2020 FINDINGS: Gastric catheter is noted within the stomach. Diffuse vascular calcifications are seen. No obstructive pattern is noted. IMPRESSION: Gastric catheter within the stomach. Electronically Signed   By: Inez Catalina M.D.   On: 11/04/2020 22:24   DG Abd 1  View  Result Date: 10/29/2020 CLINICAL DATA:  Status post or gastric feeding tube placement. EXAM: ABDOMEN - 1 VIEW COMPARISON:  Chest x-ray today FINDINGS: Enteric tube has been placed, tip overlying the level of the proximal to mid stomach. There is persistent dense opacity in the LEFT LOWER lobe. Paucity of bowel gas. IMPRESSION: Enteric tube tip overlying the level of the stomach. Electronically Signed   By: [name] M.D.   On: 10/29/2020 12:15   CT CHEST ABDOMEN PELVIS W CONTRAST  Result Date: 11/02/2020 CLINICAL DATA:  Sepsis.  Elevated white count. EXAM: CT CHEST, ABDOMEN, AND PELVIS WITH CONTRAST TECHNIQUE: Multidetector CT imaging of the chest, abdomen and pelvis was performed following the standard protocol during bolus administration of intravenous contrast. CONTRAST:  172m OMNIPAQUE IOHEXOL 300 MG/ML  SOLN COMPARISON:  December 29, 2019. FINDINGS: CT CHEST FINDINGS Cardiovascular: Atherosclerosis of thoracic aorta is noted without aneurysm or dissection. Coronary artery calcifications are noted. Normal cardiac size. Minimal pericardial effusion is noted. Mediastinum/Nodes: Thyroid gland is unremarkable. Endotracheal tube is directed slightly into right mainstem bronchus; withdrawal by 2-3 cm is recommended. Nasogastric tube is seen passing through esophagus and into stomach. No adenopathy is noted. Lungs/Pleura: No pneumothorax is noted. Small right pleural effusion is noted with adjacent subsegmental atelectasis of the right lower lobe. Pigtail chest tube is noted in the left hemithorax. Musculoskeletal: No chest wall mass or suspicious bone lesions identified. CT ABDOMEN PELVIS FINDINGS Hepatobiliary: No gallstones or biliary dilatation is noted. Mild gallbladder distention is now noted. The liver is unremarkable. Pancreas: Unremarkable. No pancreatic ductal dilatation or surrounding inflammatory changes. Spleen: Normal in size without focal abnormality. Adrenals/Urinary Tract: Adrenal  glands appear normal. Bilateral renal cysts are noted. 3.5 cm heterogeneously enhancing abnormality is noted arising from lower pole of left kidney concerning for possible renal cell carcinoma. No hydronephrosis or renal obstruction is noted. No renal or ureteral calculi are noted. Urinary bladder is unremarkable. Stomach/Bowel: Nasogastric tube tip is seen in proximal stomach. There is no evidence of bowel obstruction or inflammation. The appendix appears normal. Stool is noted throughout the colon. Vascular/Lymphatic: Aortic atherosclerosis. No enlarged abdominal or pelvic lymph nodes. Reproductive: Uterus and bilateral adnexa are unremarkable. Other: No abdominal wall hernia or abnormality. No abdominopelvic ascites. Musculoskeletal: No acute or significant osseous findings. IMPRESSION: 3.5 cm heterogeneously enhancing abnormality is again noted arising from lower pole of left kidney concerning for possible neoplasm. Further evaluation with MRI with and without gadolinium is recommended. Endotracheal tube is directed slightly into right mainstem bronchus; withdrawal by 2-3 cm is recommended. Small right pleural effusion is noted with adjacent subsegmental atelectasis of right lower lobe. Pigtail catheter is noted in left hemithorax. No pneumothorax is noted. Mild gallbladder distention is now noted. No cholelithiasis or inflammatory changes noted. Aortic Atherosclerosis (ICD10-I70.0). Electronically Signed   By: JMarijo ConceptionM.D.   On: 11/02/2020 16:46   DG CHEST PORT 1 VIEW  Result Date: 11/04/2020 CLINICAL DATA:  Pneumonia EXAM: PORTABLE CHEST 1 VIEW COMPARISON:  Chest x-ray dated 11/03/2020. FINDINGS: Heart size and mediastinal contours are stable. Endotracheal tube is well positioned with tip just above the level the carina. LEFT IJ central line is stable in position with tip at the level of the mid SVC. Lungs appear clear.  No pleural effusion or pneumothorax is seen. IMPRESSION: 1. No active disease.  No evidence of pneumonia or pulmonary edema. No pneumothorax. 2. Endotracheal tube and LEFT IJ central line are stable in position. Electronically Signed   By: SFranki CabotM.D.   On: 11/04/2020 08:29   DG CHEST PORT 1 VIEW  Result Date: 11/03/2020 CLINICAL DATA:  ETT present, altered mental status EXAM: PORTABLE CHEST 1 VIEW COMPARISON:  11/02/2020 FINDINGS: Endotracheal tube with the tip 3.2 cm above the carina. Nasogastric tube coursing below the diaphragm. Left jugular central venous catheter with the tip projecting over the upper SVC. Right subclavian stent again noted. Interval removal of the left-sided chest tube. No focal consolidation. No pleural effusion or pneumothorax. Stable cardiomediastinal silhouette. No acute osseous abnormality. IMPRESSION: 1. Interval removal of the left-sided chest tube. No pneumothorax. 2. Endotracheal tube with the tip 3.2 cm above the carina. Electronically Signed   By: HKathreen Devoid  On: 11/03/2020 09:04  DG Chest Port 1 View  Result Date: 11/02/2020 CLINICAL DATA:  Intubation, altered mental status EXAM: PORTABLE CHEST 1 VIEW COMPARISON:  Portable exam 0528 hours compared to 10/31/2020 FINDINGS: Tip of endotracheal tube projects 2.0 cm above carina. Nasogastric tube extends into stomach. Pigtail LEFT thoracostomy tube present. LEFT jugular dual-lumen central venous catheter with tip projecting over SVC at the level of the azygos confluence. Vascular stents at the RIGHT subclavian/axillary vessels. Normal heart size and mediastinal contours. Atherosclerotic calcification aorta. Mild RIGHT basilar atelectasis. Lungs otherwise clear. No pleural effusion or pneumothorax. IMPRESSION: Mild RIGHT basilar atelectasis. Electronically Signed   By: Lavonia Dana M.D.   On: 11/02/2020 08:44   DG Chest Port 1 View  Result Date: 10/31/2020 CLINICAL DATA:  Chest tube placement EXAM: PORTABLE CHEST 1 VIEW COMPARISON:  10/31/2020 FINDINGS: Endotracheal tube overlies the distal  trachea, 8 mm above the carina. Left neck catheter tip overlies the there is a new left basilar chest tube with significantly decreased size of the left pleural effusion. Unchanged right pleural effusion. Unchanged cardiomediastinal silhouette. Unchanged right axillary stent. Esophageal temperature probe overlies the thoracic inlet. Bones are unchanged. IMPRESSION: Significantly decreased size of the left pleural effusion after chest tube placement. Unchanged cardiomegaly and right pleural effusion. Endotracheal tube tip is again seen in the distal trachea, 8 mm above the carina, consider retraction by 2.0 cm. Electronically Signed   By: Maurine Simmering   On: 10/31/2020 18:43   DG CHEST PORT 1 VIEW  Result Date: 10/31/2020 CLINICAL DATA:  Pleural effusion history of CHF. History of asthma. EXAM: PORTABLE CHEST 1 VIEW COMPARISON:  10/29/2020. FINDINGS: Endotracheal tube noted with tip 1 cm above the carina. Proximal repositioning of approximately 2 cm should be considered. NG tube and left IJ line in stable position. Cardiomegaly with bilateral pulmonary infiltrates/edema and bilateral pleural effusions. Findings consistent with CHF. No pneumothorax. Right subclavian vascular stent noted. IMPRESSION: 1. Endotracheal tube noted with its tip 1 cm above the carina. Proximal repositioning of approximately 2 cm should be considered. NG tube left IJ line in stable position. 2. Cardiomegaly with bilateral pulmonary infiltrates/edema bilateral pleural effusions. Findings consistent CHF. Electronically Signed   By: Marcello Moores  Register   On: 10/31/2020 12:45   DG Chest Port 1 View  Result Date: 10/29/2020 CLINICAL DATA:  65 year old female with hypoxia.  Intubated. EXAM: PORTABLE CHEST 1 VIEW COMPARISON:  10/28/2020 and earlier. FINDINGS: Portable AP semi upright view at 0446 hours. Endotracheal tube in good position projecting over the airway between the clavicles and carina. A left chest approach vascular catheter projects at  the cavoatrial junction region. Mildly improved lung volumes and decreased but not resolved veiling opacity in the left hemithorax with continued obscuration of the left hemidiaphragm. Dense retrocardiac opacity. Probable cardiomegaly. Improved right lung base ventilation. No pneumothorax. Paucity of bowel gas in the upper abdomen. IMPRESSION: 1. Endotracheal tube in good position. Left chest approach central line tip in good position near the cavoatrial junction. 2. Mildly improved lung volumes and ventilation. Continued left greater than right pleural effusions, with left lower lobe collapse or consolidation. Electronically Signed   By: Genevie Ann M.D.   On: 10/29/2020 05:13   DG CHEST PORT 1 VIEW  Result Date: 10/28/2020 CLINICAL DATA:  Hypoxemia. EXAM: PORTABLE CHEST 1 VIEW COMPARISON:  Radiograph yesterday. FINDINGS: Cardiomegaly. Worsening pleural effusions. Retrocardiac opacity is partially obscured by pleural fluid on the current exam. Lower lung volumes likely due to increasing compressive atelectasis. Increasing vascular congestion. Vascular  stent in the region of the right subclavian. No pneumothorax. IMPRESSION: 1. Worsening pleural effusions and vascular congestion. Left pleural effusion obscures retrocardiac opacity. 2. Lower lung volumes likely due to increasing compressive atelectasis. 3. Cardiomegaly. Electronically Signed   By: Keith Rake M.D.   On: 10/28/2020 22:32   DG Chest Port 1 View  Result Date: 10/27/2020 CLINICAL DATA:  Confused after dialysis, history of asthma EXAM: PORTABLE CHEST 1 VIEW COMPARISON:  06/22/2020 FINDINGS: Dense retrocardiac and left mid lung opacity partially silhouetting portion of the left heart border. Could reflect a combination of volume loss and airspace consolidation in the lingula. Remaining portions the lungs are otherwise clear aside from basilar atelectasis. Visible portions of the cardiac silhouette suggests some mild cardiomegaly accounting for  portable technique. The aorta is calcified. The remaining cardiomediastinal contours are unremarkable. Vascular stenting noted in the right subclavian to axillary vessels. Telemetry leads overlie the chest. Degenerative changes are present in the imaged spine and shoulders. IMPRESSION: Dense retrocardiac and left mid lung opacity partially silhouetting the left heart border. Could reflect acute consolidation or pneumonia with volume loss in the lingula. Cardiomegaly Aortic Atherosclerosis (ICD10-I70.0). Electronically Signed   By: Lovena Le M.D.   On: 10/27/2020 19:46   DG Abd Portable 1V  Result Date: 11/08/2020 CLINICAL DATA:  Evaluate feeding tube placement EXAM: PORTABLE ABDOMEN - 1 VIEW COMPARISON:  11/04/2020 FINDINGS: Interval placement of feeding tube. The tip is in the projection of the distal stomach/pylorus. Distal portion of left IJ catheter is again noted projecting over the cavoatrial junction. IMPRESSION: Interval placement of feeding tube with tip in the distal stomach/pylorus. Electronically Signed   By: Kerby Moors M.D.   On: 11/08/2020 11:35   DG Swallowing Func-Speech Pathology  Result Date: 11/09/2020 Formatting of this result is different from the original. Objective Swallowing Evaluation: Type of Study: MBS-Modified Barium Swallow Study  Patient Details Name: Christy Zhang MRN: [id number] Date of Birth: 10-25-1955 Today's Date: 11/09/2020 Time: SLP Start Time (ACUTE ONLY): [id number] -SLP Stop Time (ACUTE ONLY): 0840 SLP Time Calculation (min) (ACUTE ONLY): 12 min Past Medical History: Past Medical History: Diagnosis Date  Anemia   Arthritis   Asthma   in the past  CHF (congestive heart failure) ([place])   Diabetes mellitus with complication ([facility])   Type 2  End stage renal disease on dialysis ([place])   Gangrene ([place])   Hyperlipidemia   Hypertension  Past Surgical History: Past Surgical History: Procedure Laterality Date   LEFT BELOW KNEE AMPUTATION (Left Knee)  06/08/2020  A/V FISTULAGRAM  Right 02/04/2017  Procedure: A/V Fistulagram;  Surgeon: Waynetta Sandy, MD;  Location: [place] [facility];  Service: Cardiovascular;  Laterality: Right;  ABDOMINAL AORTOGRAM W/LOWER EXTREMITY N/A 11/18/2019  Procedure: ABDOMINAL AORTOGRAM W/LOWER EXTREMITY;  Surgeon: [name], MD;  Location: [place] [facility];  Service: Cardiovascular;  Laterality: N/A;  ABDOMINAL AORTOGRAM W/LOWER EXTREMITY N/A 02/23/2020  Procedure: ABDOMINAL AORTOGRAM W/LOWER EXTREMITY;  Surgeon: [name], MD;  Location: [place] [facility];  Service: Cardiovascular;  Laterality: N/A;  Bilateral  ABDOMINAL AORTOGRAM W/LOWER EXTREMITY N/A 08/10/2020  Procedure: ABDOMINAL AORTOGRAM W/LOWER EXTREMITY;  Surgeon: [name], MD;  Location: [place] [facility];  Service: Cardiovascular;  Laterality: N/A;  AMPUTATION Left 06/08/2020  Procedure: LEFT BELOW KNEE AMPUTATION;  Surgeon: Cherre Robins, MD;  Location: [place];  Service: Vascular;  Laterality: Left;  AMPUTATION Right 09/14/2020  Procedure: RIGHT BELOW KNEE AMPUTATION;  Surgeon: Waynetta Sandy,  MD;  Location: [place];  Service: Vascular;  Laterality: Right;  AV FISTULA PLACEMENT Right 08/25/2014  Procedure: RIGHT BRACHIOCEPHALIC ARTERIOVENOUS (AV) FISTULA CREATION;  Surgeon: Mal Misty, MD;  Location: [place];  Service: Vascular;  Laterality: Right;  AV FISTULA PLACEMENT Right 03/18/2019  Procedure: INSERTION OF ARTERIOVENOUS (AV) GORE-TEX GRAFT RIGHT ARM;  Surgeon: Serafina Mitchell, MD;  Location: [place];  Service: Vascular;  Laterality: Right;  [place] Right 12/24/2018  Procedure: FIRST STAGE BASILIC VEIN TRANSPOSITION RIGHT UPPER ARM;  Surgeon: Serafina Mitchell, MD;  Location: [place];  Service: Vascular;  Laterality: Right;  FISTULA SUPERFICIALIZATION Right 11/03/2014  Procedure: RIGHT UPPER ARM FISTULA SUPERFICIALIZATION;  Surgeon: Mal Misty, MD;  Location: [place];  Service: Vascular;  Laterality: Right;  FISTULOGRAM Right  01/16/2016  Procedure: RIGHT UPPER EXTREMITY ARTERIOVENOUS FISTULOGRAM WITH BALLOON ANGIOPLASTY;  Surgeon: Vickie Epley, MD;  Location: [facility];  Service: Vascular;  Laterality: Right;  INSERTION OF DIALYSIS CATHETER    X4  IR FLUORO GUIDE CV LINE RIGHT  12/03/2018  IR REMOVAL TUN CV CATH W/O FL  07/06/2019  IR THROMBECTOMY AV FISTULA W/THROMBOLYSIS/PTA INC/SHUNT/IMG RIGHT Right 12/03/2018  IR THROMBECTOMY AV FISTULA W/THROMBOLYSIS/PTA/STENT INC/SHUNT/IMG RT Right 10/11/2019  IR US GUIDE VASC ACCESS RIGHT  12/03/2018  IR US GUIDE VASC ACCESS RIGHT  12/03/2018  IR US GUIDE VASC ACCESS RIGHT  10/14/2019  LIGATION OF COMPETING BRANCHES OF ARTERIOVENOUS FISTULA Right 11/03/2014  Procedure: LIGATION OF COMPETING BRANCHE x1 OF RIGHT UPPER ARM ARTERIOVENOUS FISTULA;  Surgeon: Mal Misty, MD;  Location: [facility];  Service: Vascular;  Laterality: Right;  PERIPHERAL VASCULAR BALLOON ANGIOPLASTY Right 02/04/2017  Procedure: PERIPHERAL VASCULAR BALLOON ANGIOPLASTY;  Surgeon: Waynetta Sandy, MD;  Location: [place] [facility];  Service: Cardiovascular;  Laterality: Right;  PERIPHERAL VASCULAR BALLOON ANGIOPLASTY Right 11/18/2019  Procedure: PERIPHERAL VASCULAR BALLOON ANGIOPLASTY;  Surgeon: [name], MD;  Location: [place] [facility];  Service: Cardiovascular;  Laterality: Right;  anterior tibial  PERIPHERAL VASCULAR BALLOON ANGIOPLASTY  02/23/2020  Procedure: PERIPHERAL VASCULAR BALLOON ANGIOPLASTY;  Surgeon: [name], MD;  Location: [place];  Service: Cardiovascular;;  Lt  AT  PERIPHERAL VASCULAR INTERVENTION Right 08/10/2020  Procedure: PERIPHERAL VASCULAR INTERVENTION;  Surgeon: [name], MD;  Location: [place] [facility];  Service: Cardiovascular;  Laterality: Right;  THROMBECTOMY BRACHIAL ARTERY Left 11/04/2020  Procedure: THROMBECTOMY RADIAL ARTERY with Bovine Patching.;  Surgeon: Cherre Robins, MD;  Location: [place];  Service: Vascular;  Laterality: Left;  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 Brief PEA 7/3. PMH includes: CHF, CKD, T2DM, ESRD, HLD, HTN  Subjective: alert, cooperative, a little confused Assessment / Plan / Recommendation CHL IP CLINICAL IMPRESSIONS 11/09/2020 Clinical Impression Pt has a mild oropharyngeal dysphagia, with oral phase functional for most consistencies but with inadequate mastication of solids that leaves significant lingual residue. Otherwise, she propels liquid boluses in particularly very quickly to her pharynx. Thin liquids move so quickly that she spills them directly into her airway before the swallow, resulting in sensed aspiration. Although  view is somewhat limited by shoulders, this does not appear to clear. Her hyoid excursion and epiglottic inversion are also mildly reduced, although inversion improves with the weight of heavier boluses. Mild-moderate residue remains in the valleculae with heavier consistencies as well though. Recommend starting with Dys 1 (puree) diet and nectar thick liquids. SLP Visit Diagnosis Dysphagia, oropharyngeal phase (R13.12) Attention and concentration deficit following -- Frontal lobe and executive function deficit following -- Impact on safety and function Mild aspiration risk   CHL IP TREATMENT RECOMMENDATION 11/09/2020 Treatment Recommendations Therapy as outlined in treatment plan below   Prognosis 11/09/2020 Prognosis for Safe Diet Advancement Good Barriers to Reach Goals Cognitive deficits Barriers/Prognosis Comment -- CHL IP DIET RECOMMENDATION  11/09/2020 SLP Diet Recommendations Dysphagia 1 (Puree) solids;Nectar thick liquid Liquid Administration via Cup;Straw Medication Administration Crushed with puree Compensations Minimize environmental distractions;Slow rate;Small sips/bites Postural Changes Seated upright at 90 degrees   CHL IP OTHER RECOMMENDATIONS 11/09/2020 Recommended Consults -- Oral Care Recommendations Oral care BID Other Recommendations Prohibited food (jello, ice cream, thin soups);Remove water pitcher   CHL IP FOLLOW UP RECOMMENDATIONS 11/09/2020 Follow up Recommendations Home health SLP   CHL IP FREQUENCY AND DURATION 11/09/2020 Speech Therapy Frequency (ACUTE ONLY) min 2x/week Treatment Duration 2 weeks      CHL IP ORAL PHASE 11/09/2020 Oral Phase Impaired Oral - Pudding Teaspoon -- Oral - Pudding Cup -- Oral - Honey Teaspoon -- Oral - Honey Cup -- Oral - Nectar Teaspoon WFL Oral - Nectar Cup WFL Oral - Nectar Straw WFL Oral - Thin Teaspoon WFL Oral - Thin Cup -- Oral - Thin Straw -- Oral - Puree WFL Oral - Mech Soft -- Oral - Regular Impaired mastication;Lingual/palatal residue Oral - Multi-Consistency -- Oral - Pill -- Oral Phase - Comment --  CHL IP PHARYNGEAL PHASE 11/09/2020 Pharyngeal Phase Impaired Pharyngeal- Pudding Teaspoon -- Pharyngeal -- Pharyngeal- Pudding Cup -- Pharyngeal -- Pharyngeal- Honey Teaspoon -- Pharyngeal -- Pharyngeal- Honey Cup -- Pharyngeal -- Pharyngeal- Nectar Teaspoon Reduced anterior laryngeal mobility Pharyngeal -- Pharyngeal- Nectar Cup Reduced anterior laryngeal mobility Pharyngeal -- Pharyngeal- Nectar Straw Reduced anterior laryngeal mobility Pharyngeal -- Pharyngeal- Thin Teaspoon Reduced anterior laryngeal mobility;Reduced epiglottic inversion;Penetration/Aspiration before swallow Pharyngeal Material enters airway, passes BELOW cords and not ejected out despite cough attempt by patient Pharyngeal- Thin Cup -- Pharyngeal -- Pharyngeal- Thin Straw -- Pharyngeal -- Pharyngeal- Puree Reduced anterior  laryngeal mobility;Pharyngeal residue - valleculae Pharyngeal -- Pharyngeal- Mechanical Soft -- Pharyngeal -- Pharyngeal- Regular Reduced anterior laryngeal mobility;Pharyngeal residue - valleculae Pharyngeal -- Pharyngeal- Multi-consistency -- Pharyngeal -- Pharyngeal- Pill -- Pharyngeal -- Pharyngeal Comment --  CHL IP CERVICAL ESOPHAGEAL PHASE 11/09/2020 Cervical Esophageal Phase WFL Pudding Teaspoon -- Pudding Cup -- Honey Teaspoon -- Honey Cup -- Nectar Teaspoon -- Nectar Cup -- Nectar Straw -- Thin Teaspoon -- Thin Cup -- Thin Straw -- Puree -- Mechanical Soft -- Regular -- Multi-consistency -- Pill -- Cervical Esophageal Comment -- Osie Bond., M.A. CCC-SLP Acute Rehabilitation Services Pager [phone] Office [phone] 11/09/2020, 9:17 AM              VAS US DUPLEX DIALYSIS ACCESS (AVF, AVG)  Result Date: 11/09/2020 DIALYSIS ACCESS Patient Name:  Christy Zhang  Date of Exam:   11/09/2020 Medical Rec #: [id number]          Accession #:    [id number] Date of Birth: [birth year]          Patient Gender: F Patient Age:   65Y Exam Location:  [facility] Procedure:      VAS US DUPLEX DIALYSIS ACCESS (AVF, AVG) Referring Phys: [id number] Armando Gang HAN --------------------------------------------------------------------------------  Reason for Exam: Malfunctioning right AV Graft, prefistulagram evaluation. Access Site: Right Upper Extremity. Access Type: Upper arm loop AVG. Limitations: body habitus and tissue properties Comparison Study: no prior Performing Technologist: Archie Patten RVS  Examination Guidelines: A complete evaluation includes B-mode imaging, spectral Doppler, color Doppler, and power Doppler as needed of all accessible portions of each vessel. Unilateral testing is considered an integral part of a complete examination. Limited examinations for reoccurring indications may be performed as noted.  Findings:   +--------------------+----------+-----------------+--------+ AVG                  PSV (cm/s)Flow Vol (mL/min)Describe +--------------------+----------+-----------------+--------+ Native artery inflow   199            9                 +--------------------+----------+-----------------+--------+ Arterial anastomosis   429                              +--------------------+----------+-----------------+--------+ Prox graft             181                              +--------------------+----------+-----------------+--------+ Mid graft              155                              +--------------------+----------+-----------------+--------+ Distal graft           425                              +--------------------+----------+-----------------+--------+ Venous anastomosis     155                              +--------------------+----------+-----------------+--------+ Venous outflow         196                              +--------------------+----------+-----------------+--------+  Summary: No obvious evidence of thrombus/stenosis or perigraft fluid.  *See table(s) above for measurements and observations.  Diagnosing physician: Jamelle Haring Electronically signed by Jamelle Haring on 11/09/2020 at 5:15:00 PM.    --------------------------------------------------------------------------------   Final    VAS [place] UPPER EXTREMITY ARTERIAL DUPLEX  Result Date: 11/04/2020  UPPER EXTREMITY DUPLEX STUDY Patient Name:  Christy Zhang  Date of Exam:   11/03/2020 Medical Rec #: [id number]          Accession #:    [id number] Date of Birth: 08-13-1955          Patient Gender: F Patient Age:   65Y Exam Location:  [facility] Procedure:      VAS [place] UPPER EXTREMITY ARTERIAL DUPLEX Referring Phys: [id number] MATTHEW EVELAND --------------------------------------------------------------------------------  Indications: Cold, blue hand, unable to doppler radial pulse. History:     Patient has a history of catheterization via left radial artery.  Other Factors:  Vasopressors. Limitations: Bandaging, lines Comparison Study: No prior studies. Performing Technologist: Darlin Coco RDMS,RVT  Examination Guidelines: A complete evaluation includes B-mode  imaging, spectral Doppler, color Doppler, and power Doppler as needed of all accessible portions of each vessel. Bilateral testing is considered an integral part of a complete examination. Limited examinations for reoccurring indications may be performed as noted.  Left Doppler Findings: +---------------+----------+------------------+--------+-----------------------+ Site           PSV (cm/s)Waveform          StenosisComments                +---------------+----------+------------------+--------+-----------------------+ Subclavian Prox54        audibly triphasic         Bandaging- poor angle                                                      of insonation           +---------------+----------+------------------+--------+-----------------------+ Subclavian Mid                                     Bandaging               +---------------+----------+------------------+--------+-----------------------+ Subclavian Dist                                    Bandaging               +---------------+----------+------------------+--------+-----------------------+ Axillary       98        triphasic                                         +---------------+----------+------------------+--------+-----------------------+ Brachial Prox  103       triphasic                                         +---------------+----------+------------------+--------+-----------------------+ Brachial Mid   96        triphasic                                         +---------------+----------+------------------+--------+-----------------------+ Brachial Dist  90        biphasic                  Non-obstructive lesion                                                     identified at the                                                           distal brachial         +---------------+----------+------------------+--------+-----------------------+ Radial Prox  20        monophasic                                        +---------------+----------+------------------+--------+-----------------------+ Radial Mid     23        monophasic                                        +---------------+----------+------------------+--------+-----------------------+ Radial Dist                                occluded                        +---------------+----------+------------------+--------+-----------------------+ Ulnar Prox     85        biphasic                                          +---------------+----------+------------------+--------+-----------------------+ Ulnar Mid      28        monophasic                Calcified appearance    +---------------+----------+------------------+--------+-----------------------+ Ulnar Dist     7         dampened                  Calcified appearance                             monophasic                                        +---------------+----------+------------------+--------+-----------------------+ Occluded radial artery from the mid to distal segment. Ulnar artery patent with calcification through the mid to distal segments. Appearance of a non-obstructive lesion in the distal brachial artery.  Summary:  Left: Obstruction noted in the radial artery. *See table(s) above for measurements and observations. Electronically signed by Jamelle Haring on 11/04/2020 at 4:29:57 PM.    Final    ECHOCARDIOGRAM COMPLETE  Result Date: 10/29/2020    ECHOCARDIOGRAM REPORT   Patient Name:   Christy Zhang Date of Exam: 10/29/2020 Medical Rec #:  [id number]         Height:       64.0 in Accession #:    [id number]        Weight:       213.0 lb Date of Birth:  06-05-1955         BSA:          2.009 m Patient Age:    23 years          BP:            54/105 mmHg Patient Gender: F                 HR:           101 bpm. Exam Location:  Inpatient Procedure: 2D Echo, Color Doppler and Cardiac Doppler Indications:    R06.9 DOE  History:  Patient has no prior history of Echocardiogram examinations.                 CHF; Risk Factors:Hypertension, Diabetes and Dyslipidemia.  Sonographer:    Raquel Sarna Senior RDCS Referring Phys: [id number] [facility]  Sonographer Comments: Scanned supine on artificial respirator. IMPRESSIONS  1. Left ventricular ejection fraction, by estimation, is 55 to 60%. The left ventricle has normal function. The left ventricle has no regional wall motion abnormalities. There is moderate left ventricular hypertrophy. Left ventricular diastolic parameters are indeterminate.  2. Right ventricular systolic function is normal. The right ventricular size is normal. There is moderately elevated pulmonary artery systolic pressure.  3. A small pericardial effusion is present.  4. The mitral valve is normal in structure. No evidence of mitral valve regurgitation. No evidence of mitral stenosis.  5. The aortic valve is tricuspid. Aortic valve regurgitation is not visualized. No aortic stenosis is present.  6. The inferior vena cava is dilated in size with <50% respiratory variability, suggesting right atrial pressure of 15 mmHg. FINDINGS  Left Ventricle: Left ventricular ejection fraction, by estimation, is 55 to 60%. The left ventricle has normal function. The left ventricle has no regional wall motion abnormalities. The left ventricular internal cavity size was normal in size. There is  moderate left ventricular hypertrophy. Left ventricular diastolic parameters are indeterminate. Right Ventricle: The right ventricular size is normal.Right ventricular systolic function is normal. There is moderately elevated pulmonary artery systolic pressure. The tricuspid regurgitant velocity is 3.09 m/s, and with an assumed right atrial pressure of 15 mmHg, the  estimated right ventricular systolic pressure is [id number] mmHg. Left Atrium: Left atrial size was normal in size. Right Atrium: Right atrial size was normal in size. Pericardium: A small pericardial effusion is present. Mitral Valve: The mitral valve is normal in structure. No evidence of mitral valve regurgitation. No evidence of mitral valve stenosis. Tricuspid Valve: The tricuspid valve is normal in structure. Tricuspid valve regurgitation is mild . No evidence of tricuspid stenosis. Aortic Valve: The aortic valve is tricuspid. Aortic valve regurgitation is not visualized. No aortic stenosis is present. Pulmonic Valve: The pulmonic valve was normal in structure. Pulmonic valve regurgitation is trivial. No evidence of pulmonic stenosis. Aorta: The aortic root is normal in size and structure. Venous: The inferior vena cava is dilated in size with less than 50% respiratory variability, suggesting right atrial pressure of 15 mmHg. IAS/Shunts: No atrial level shunt detected by color flow Doppler.  LEFT VENTRICLE PLAX 2D LVIDd:         3.30 cm  Diastology LVIDs:         2.10 cm  LV e' medial:    5.22 cm/s LV PW:         1.50 cm  LV E/e' medial:  14.2 LV IVS:        1.30 cm  LV e' lateral:   6.85 cm/s LVOT diam:     1.80 cm  LV E/e' lateral: 10.8 LV SV:         31 LV SV Index:   15 LVOT Area:     2.54 cm  RIGHT VENTRICLE RV S prime:     9.36 cm/s TAPSE (M-mode): 1.1 cm LEFT ATRIUM           Index       RIGHT ATRIUM           Index LA diam:      4.10 cm 2.04 cm/m  RA Area:  13.60 cm LA Vol (A2C): 49.1 ml 24.44 ml/m RA Volume:   37.10 ml  18.46 ml/m LA Vol (A4C): 50.4 ml 25.08 ml/m  AORTIC VALVE LVOT Vmax:   76.65 cm/s LVOT Vmean:  49.000 cm/s LVOT VTI:    0.122 m  AORTA Ao Root diam: 2.80 cm Ao Asc diam:  3.00 cm MITRAL VALVE               TRICUSPID VALVE MV Area (PHT): 3.74 cm    TR Peak grad:   38.2 mmHg MV Decel Time: 203 msec    TR Vmax:        309.00 cm/s MV E velocity: 73.90 cm/s MV A velocity: 46.10 cm/s   SHUNTS MV E/A ratio:  1.60        Systemic VTI:  0.12 m                            Systemic Diam: 1.80 cm Kirk Ruths MD Electronically signed by Kirk Ruths MD Signature Date/Time: 10/29/2020/10:42:54 AM    Final    ECHOCARDIOGRAM LIMITED  Result Date: 11/03/2020    ECHOCARDIOGRAM LIMITED REPORT   Patient Name:   Christy Zhang [place] Date of Exam: 11/03/2020 Medical Rec #:  [id number]         Height:       64.0 in Accession #:    [id number]        Weight:       191.1 lb Date of Birth:  03/22/1956         BSA:          1.919 m Patient Age:    33 years          BP:           114/43 mmHg Patient Gender: F                 HR:           80 bpm. Exam Location:  Inpatient Procedure: Limited Color Doppler, Color Doppler and Cardiac Doppler Indications:    acute myocardial infarction  History:        Patient has prior history of Echocardiogram examinations, most                 recent 10/29/2020. End stage renal disease; Risk                 Factors:Hypertension.  Sonographer:    Johny Chess Referring Phys: [id number] [place]  1. Left ventricular ejection fraction, by estimation, is 60 to 65%. The left ventricle has normal function. The left ventricle has no regional wall motion abnormalities. There is moderate left ventricular hypertrophy. Left ventricular diastolic parameters are indeterminate.  2. Right ventricular systolic function is mildly reduced. The right ventricular size is normal. Tricuspid regurgitation signal is inadequate for assessing PA pressure.  3. A small pericardial effusion is present.  4. The mitral valve is normal in structure. No evidence of mitral valve regurgitation. No evidence of mitral stenosis.  5. The aortic valve is tricuspid. Aortic valve regurgitation is not visualized. Mild aortic valve sclerosis is present, with no evidence of aortic valve stenosis.  6. The inferior vena cava is normal in size with <50% respiratory variability, suggesting right atrial pressure of 8  mmHg. FINDINGS  Left Ventricle: Left ventricular ejection fraction, by estimation, is 60 to 65%. The left ventricle has normal function. The left ventricle  has no regional wall motion abnormalities. The left ventricular internal cavity size was small. There is moderate  left ventricular hypertrophy. Left ventricular diastolic parameters are indeterminate. Right Ventricle: The right ventricular size is normal. Right ventricular systolic function is mildly reduced. Tricuspid regurgitation signal is inadequate for assessing PA pressure. Pericardium: A small pericardial effusion is present. Mitral Valve: The mitral valve is normal in structure. No evidence of mitral valve stenosis. Tricuspid Valve: The tricuspid valve is normal in structure. Tricuspid valve regurgitation is trivial. Aortic Valve: The aortic valve is tricuspid. Aortic valve regurgitation is not visualized. Mild aortic valve sclerosis is present, with no evidence of aortic valve stenosis. Pulmonic Valve: The pulmonic valve was not well visualized. Pulmonic valve regurgitation is not visualized. Venous: The inferior vena cava is normal in size with less than 50% respiratory variability, suggesting right atrial pressure of 8 mmHg. LEFT VENTRICLE PLAX 2D LVIDd:         3.70 cm Diastology LVIDs:         2.70 cm LV e' medial:    6.85 cm/s LV PW:         1.30 cm LV E/e' medial:  8.1 LV IVS:        1.20 cm LV e' lateral:   7.72 cm/s                        LV E/e' lateral: 7.2  IVC IVC diam: 2.00 cm LEFT ATRIUM         Index LA diam:    3.70 cm 1.93 cm/m  AORTIC VALVE LVOT Vmax:   84.00 cm/s LVOT Vmean:  55.300 cm/s LVOT VTI:    0.154 m MITRAL VALVE MV Area (PHT): 2.66 cm    SHUNTS MV Decel Time: 285 msec    Systemic VTI: 0.15 m MV E velocity: 55.70 cm/s MV A velocity: 54.80 cm/s MV E/A ratio:  1.02 Oswaldo Milian MD Electronically signed by Oswaldo Milian MD Signature Date/Time: 11/03/2020/1:53:23 PM    Final       Subjective: Patient seen and  examined at bedside this morning.  Medically stable for discharge.  I discussed the discharge planning with the daughter on phone  Discharge Exam: Vitals:   11/14/20 0501 11/14/20 0700  BP: (!) 165/64 (!) 167/61  Pulse: 82 87  Resp:  (!) 22  Temp: 98.6 F (37 C) 98 F (36.7 C)  SpO2: 99% 99%   Vitals:   11/13/20 1306 11/13/20 1931 11/14/20 0501 11/14/20 0700  BP:  132/64 (!) 165/64 (!) 167/61  Pulse: 83 83 82 87  Resp:  18  (!) 22  Temp: 98.3 F (36.8 C) 98.7 F (37.1 C) 98.6 F (37 C) 98 F (36.7 C)  TempSrc: Oral  Oral Oral  SpO2: 98%  99% 99%  Weight: 90 kg     Height:        General: Pt is alert, awake, not in acute distress Cardiovascular: RRR, S1/S2 +, no rubs, no gallops Respiratory: CTA bilaterally, no wheezing, no rhonchi Abdominal: Soft, NT, ND, bowel sounds + Extremities: Gangrenous left hand, bilateral BKA    The results of significant diagnostics from this hospitalization (including imaging, microbiology, ancillary and laboratory) are listed below for reference.     Microbiology: No results found for this or any previous visit (from the past 240 hour(s)).   Labs: BNP (last 3 results) Recent Labs    11/04/20 0401  BNP [id number]*   Basic Metabolic Panel:  Recent Labs  Lab 11/08/20 0334 11/08/20 1527 11/09/20 0410 11/09/20 2000 11/10/20 0522 11/10/20 2015 11/11/20 0450 11/11/20 1632 11/12/20 0445 11/12/20 1623 11/13/20 0915 11/13/20 1537 11/14/20 0500  NA 134*   < > 133*   < > 134*   < >  --    < > 132* 137 131* 134* 136  K 3.9   < > 4.2   < > 5.3*   < >  --    < > 5.1 3.5 4.8 4.8 5.4*  CL 101   < > 98   < > 100   < >  --    < > 97* 112* 96* 99 98  CO2 24   < > 26   < > 19*   < >  --    < > 24 15* 21* 23 26  GLUCOSE 82   < > 208*   < > 72   < >  --    < > 183* 128* 192* 126* 116*  BUN 24*   < > 24*   < > 51*   < >  --    < > 66* 54* 93* 35* 44*  CREATININE 2.66*   < > 2.82*   < > 4.52*   < >  --    < > 5.76* 4.27* 7.31* 3.72* 4.81*   CALCIUM 9.3   < > 8.6*   < > 9.1   < >  --    < > 8.8* 5.9* 8.7* 8.3* 9.0  MG 3.4*  --  2.5*  --  2.8*  --  2.6*  --  2.9*  --   --   --   --   PHOS 4.6   < > 2.8   < > 4.2   < >  --    < > 5.3* 3.4 5.4* 2.8 4.4   < > = values in this interval not displayed.   Liver Function Tests: Recent Labs  Lab 11/12/20 0445 11/12/20 1623 11/13/20 0915 11/13/20 1537 11/14/20 0500  ALBUMIN 1.9* 1.2* 1.9* 2.0* 1.9*   No results for input(s): LIPASE, AMYLASE in the last 168 hours. No results for input(s): AMMONIA in the last 168 hours. CBC: Recent Labs  Lab 11/10/20 0522 11/11/20 0450 11/12/20 0445 11/13/20 0915 11/14/20 0500  WBC 21.8* 18.5* 19.1* 19.7* 18.9*  HGB 9.6* 8.6* 8.7* 8.5* 8.6*  HCT 31.0* 27.1* 27.7* 26.9* 27.8*  MCV 78.5* 77.4* 77.2* 76.6* 77.4*  PLT 705* 686* 758* 810* 888*   Cardiac Enzymes: No results for input(s): CKTOTAL, CKMB, CKMBINDEX, TROPONINI in the last 168 hours. BNP: Invalid input(s): POCBNP CBG: Recent Labs  Lab 11/13/20 1929 11/13/20 2302 11/14/20 0455 11/14/20 0741 11/14/20 1129  GLUCAP 106* 129* 107* 114* 167*   D-Dimer No results for input(s): DDIMER in the last 72 hours. Hgb A1c No results for input(s): HGBA1C in the last 72 hours. Lipid Profile No results for input(s): CHOL, HDL, LDLCALC, TRIG, CHOLHDL, LDLDIRECT in the last 72 hours. Thyroid function studies No results for input(s): TSH, T4TOTAL, T3FREE, THYROIDAB in the last 72 hours.  Invalid input(s): FREET3 Anemia work up No results for input(s): VITAMINB12, FOLATE, FERRITIN, TIBC, IRON, RETICCTPCT in the last 72 hours. Urinalysis    Component Value Date/Time   COLORURINE AMBER (A) 10/28/2020 1322   APPEARANCEUR TURBID (A) 10/28/2020 1322   LABSPEC 1.014 10/28/2020 1322   PHURINE 5.0 10/28/2020 1322   GLUCOSEU NEGATIVE 10/28/2020 1322   HGBUR MODERATE (A)  10/28/2020 1322   [place] 10/28/2020 1322   KETONESUR NEGATIVE 10/28/2020 1322   PROTEINUR 100 (A)  10/28/2020 1322   NITRITE NEGATIVE 10/28/2020 1322   LEUKOCYTESUR LARGE (A) 10/28/2020 1322   Sepsis Labs Invalid input(s): PROCALCITONIN,  WBC,  LACTICIDVEN Microbiology No results found for this or any previous visit (from the past 240 hour(s)).  Please note: You were cared for by a hospitalist during your hospital stay. Once you are discharged, your primary care physician will handle any further medical issues. Please note that NO REFILLS for any discharge medications will be authorized once you are discharged, as it is imperative that you return to your primary care physician (or establish a relationship with a primary care physician if you do not have one) for your post hospital discharge needs so that they can reassess your need for medications and monitor your lab values.    Time coordinating discharge: 40 minutes  SIGNED:   Shelly Coss, MD  [facility] 11/14/2020, 12:09 PM Pager [phone]  If 7PM-7AM, please contact night-coverage [email or web address] Password [email or web address]

## 2020-11-14 NOTE — Plan of Care (Signed)

## 2020-11-14 NOTE — Progress Notes (Signed)
  Speech Language Pathology Treatment: Dysphagia  Patient Details Name: Christy Zhang MRN: [id number] DOB: Sep 19, 1955 Today's Date: 11/14/2020 Time: 0947-1000 SLP Time Calculation (min) (ACUTE ONLY): 13 min  Assessment / Plan / Recommendation Clinical Impression  Pt's vocal quality is greatly improved since this SLP's last visit, and she consumed regular solids and thin liquids without any throat clearing or coughing. Mastication is functional considering the amount of dentition she has, and she endorses eating regular solids at home. Recommend advancing liberalizing diet of any texture restrictions. SLP will f/u briefly if she remains inpatient, although per MD note that she may discharge soon.    HPI HPI: Pt is a 65 yo female presented 7/1 with septic shock from PNA, requiring intubation (7/3-7/11) and CRRT (started 7/3). Brief PEA 7/3. PMH includes: CHF, CKD, T2DM, ESRD, HLD, HTN      SLP Plan  Continue with current plan of care       Recommendations  Diet recommendations: Regular;Thin liquid Liquids provided via: Cup;Straw Medication Administration: Whole meds with liquid Supervision: Patient able to self feed;Intermittent supervision to cue for compensatory strategies Compensations: Minimize environmental distractions;Slow rate;Small sips/bites Postural Changes and/or Swallow Maneuvers: Seated upright 90 degrees                Oral Care Recommendations: Oral care BID Follow up Recommendations: None SLP Visit Diagnosis: Dysphagia, oropharyngeal phase (R13.12) Plan: Continue with current plan of care       GO                Osie Bond., M.A. [facility] Pager [phone] Office [phone]  11/14/2020, 10:04 AM

## 2020-11-14 NOTE — Progress Notes (Signed)
Physical Therapy Treatment Patient Details Name: Christy Zhang MRN: [id number] DOB: [birth year] Today's Date: 11/14/2020    History of Present Illness 65 y.o. female presenting to ED on 10/27/2020 due to AMS. CXR suggestive of acute consolidation or pneumonia with volume loss in the lingula. ETT 7/3-7/12. 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    PT Comments    Patient drowsy and required frequent cues to stay alert and able to participate. Session focused on bed mobility due to need to use bedpan as session initiated. Patient requires max cues and max physical assist to fully roll (to either side--alternated as later coming off bedpan). See below for details    Follow Up Recommendations  Home health PT     Equipment Recommendations  None recommended by PT    Recommendations for Other Services       Precautions / Restrictions Precautions Precautions: Fall Precaution Comments: bil amputee    Mobility  Bed Mobility Overal bed mobility: Needs Assistance Bed Mobility: Rolling Rolling: Max assist;+2 for physical assistance   Supine to sit:  (deferred due to assisted on/off bedpan each visit)     General bed mobility comments: With cueing, pt able to reach across to hold bed rail, difficulty maintaining. with cuing can bend knee and bring LE across to initiate rolling, poor initiation of rolling hips and max assist needed to fully roll for bedpan (Note: pt initially seen and prior to sitting EOB noted she needed bedpan for "diarrhea." Assisted on and left pt to see another pt (~30 minutes). On return, pt still on bedpan with very little BM present. Assisted to roll to remove pan and complete pericare (pt total assist).    Transfers                 General transfer comment: Pt uses hoyer lift for transfers PTA and  since second LE amputation per daugther's reports.  Ambulation/Gait                 Stairs             Wheelchair Mobility    Modified Rankin (Stroke Patients Only)       Balance                                            Cognition Arousal/Alertness: Lethargic Behavior During Therapy: Flat affect Overall Cognitive Status: No family/caregiver present to determine baseline cognitive functioning Area of Impairment: Problem solving;Following commands;Attention;Memory;Safety/judgement;Awareness                 Orientation Level:  (NT) Current Attention Level: Focused;Sustained Memory: Decreased short-term memory Following Commands: Follows one step commands inconsistently;Follows one step commands with increased time Safety/Judgement: Decreased awareness of deficits;Decreased awareness of safety Awareness: Intellectual Problem Solving: Slow processing;Decreased initiation;Requires tactile cues;Difficulty sequencing;Requires verbal cues General Comments: able to engage in some basic conversation. Slow/delayed responses, soft volume. Requires repetition of cues for rolling even with repeated rolls (especially use of LE to help initiate rolling)      Exercises      General Comments General comments (skin integrity, edema, etc.): Session limited by need to use bedpan due to diarrhea. Noted skin tear on rt flank and applied foam dressing  and notified RN. Contacted WOC, RN re: need for air bed/mattress.      Pertinent Vitals/Pain Pain Assessment: Faces Faces Pain Scale: Hurts even more Pain Location: back/bottom Pain Descriptors / Indicators: Grimacing;Moaning Pain Intervention(s): Limited activity within patient's tolerance;Monitored during session;Repositioned;Other (comment) (contacted WOC, RN re: air mattress)    Home Living                      Prior Function            PT Goals (current goals can now be found in the care  plan section) Acute Rehab PT Goals Patient Stated Goal: none stated Time For Goal Achievement: 11/22/20 Potential to Achieve Goals: Good Progress towards PT goals: Not progressing toward goals - comment    Frequency    Min 2X/week      PT Plan Current plan remains appropriate    Co-evaluation              AM-PAC PT "6 Clicks" Mobility   Outcome Measure  Help needed turning from your back to your side while in a flat bed without using bedrails?: Total Help needed moving from lying on your back to sitting on the side of a flat bed without using bedrails?: Total Help needed moving to and from a bed to a chair (including a wheelchair)?: Total Help needed standing up from a chair using your arms (e.g., wheelchair or bedside chair)?: Total Help needed to walk in hospital room?: Total Help needed climbing 3-5 steps with a railing? : Total 6 Click Score: 6    End of Session   Activity Tolerance: Treatment limited secondary to medical complications (Comment) (limited by diarrhea) Patient left: in bed;with call bell/phone within reach;with bed alarm set Nurse Communication: Mobility status;Other (comment) (small amount loose/liquid stool) PT Visit Diagnosis: Other abnormalities of gait and mobility (R26.89);Muscle weakness (generalized) (M62.81)     Time: [id number] (time from two attempts combined) PT Time Calculation (min) (ACUTE ONLY): 25 min  Charges:  $Therapeutic Activity: 23-37 mins                      Arby Barrette, PT Pager [phone]    Rexanne Mano 11/14/2020, 12:28 PM

## 2020-11-14 NOTE — Discharge Instructions (Signed)
Information on my medicine - ELIQUIS (apixaban)   Why was Eliquis prescribed for you? Eliquis was prescribed for you to reduce the risk of a blood clot forming that can cause a stroke if you have a medical condition called atrial fibrillation (a type of irregular heartbeat).  And also due to recent surgical removal of blood clot from a blood vessel in your arm.  What do You need to know about Eliquis ? Take your Eliquis TWICE DAILY - one tablet in the morning and one tablet in the evening with or without food. If you have difficulty swallowing the tablet whole please discuss with your pharmacist how to take the medication safely.  Take Eliquis exactly as prescribed by your doctor and DO NOT stop taking Eliquis without talking to the doctor who prescribed the medication.  Stopping may increase your risk of developing a stroke, or of another blood clot forming.  Refill your prescription before you run out.  After discharge, you should have regular check-up appointments with your healthcare provider that is prescribing your Eliquis.  In the future your dose may need to be changed if your kidney function or weight changes by a significant amount or as you get older.  What do you do if you miss a dose? If you miss a dose, take it as soon as you remember on the same day and resume taking twice daily.  Do not take more than one dose of ELIQUIS at the same time to make up a missed dose.  Important Safety Information A possible side effect of Eliquis is bleeding. You should call your healthcare provider right away if you experience any of the following: Bleeding from an injury or your nose that does not stop. Unusual colored urine (red or dark brown) or unusual colored stools (red or black). Unusual bruising for unknown reasons. A serious fall or if you hit your head (even if there is no bleeding).  Some medicines may interact with Eliquis and might increase your risk of bleeding or clotting  while on Eliquis. To help avoid this, consult your healthcare provider or pharmacist prior to using any new prescription or non-prescription medications, including herbals, vitamins, non-steroidal anti-inflammatory drugs (NSAIDs) and supplements.  This website has more information on Eliquis (apixaban): http://www.eliquis.com/eliquis/home

## 2020-11-14 NOTE — TOC Transition Note (Signed)
Transition of Care [facility]) - CM/SW Discharge Note   Patient Details  Name: BEDIE BOHMER MRN: [id number] Date of Birth: Sep 11, 1955  [facility] CM/SW Contact:  Pollie Friar, RN Phone Number: 11/14/2020, 12:19 PM   Clinical Narrative:    Patient is discharging home with resumption of [place] services through [facility]. Kenzie with [facility] is aware of new orders and d/c home today. CM spoke to pts daughter, [name] and she will provide transport home for her mother. Bedside RN updated.  30 day free coupon for Eliquis provided to the patient and daughter also aware.  Pt has all needed DME at the home.   Final next level of care: Home w Home Health Services Barriers to Discharge: No Barriers Identified   Patient Goals and CMS Choice     Choice offered to / list presented to : Patient  Discharge Placement                       Discharge Plan and Services                          HH Arranged: RN, PT, OT [facility] Agency: [place] ([facility]) Date [facility] Agency Contacted: 11/14/20   Representative spoke with at [place]: [place] ([facility]) Interventions     Readmission Risk Interventions Readmission Risk Prevention Plan 09/18/2020  Transportation Screening Complete  Medication Review [employer]) Complete  PCP or Specialist appointment within 3-5 days of discharge Complete  HRI or [place] Complete  SW Recovery Care/Counseling Consult Not Complete  [place] Not Applicable  Some recent data might be hidden

## 2020-11-15 ENCOUNTER — Telehealth: Payer: Self-pay

## 2020-11-15 ENCOUNTER — Telehealth: Payer: Self-pay | Admitting: Family Medicine

## 2020-11-15 DIAGNOSIS — I480 Paroxysmal atrial fibrillation: Secondary | ICD-10-CM | POA: Diagnosis not present

## 2020-11-15 DIAGNOSIS — N186 End stage renal disease: Secondary | ICD-10-CM | POA: Diagnosis not present

## 2020-11-15 DIAGNOSIS — Z4801 Encounter for change or removal of surgical wound dressing: Secondary | ICD-10-CM | POA: Diagnosis not present

## 2020-11-15 DIAGNOSIS — J9601 Acute respiratory failure with hypoxia: Secondary | ICD-10-CM | POA: Diagnosis not present

## 2020-11-15 DIAGNOSIS — D509 Iron deficiency anemia, unspecified: Secondary | ICD-10-CM | POA: Diagnosis not present

## 2020-11-15 DIAGNOSIS — J189 Pneumonia, unspecified organism: Secondary | ICD-10-CM | POA: Diagnosis not present

## 2020-11-15 DIAGNOSIS — D631 Anemia in chronic kidney disease: Secondary | ICD-10-CM | POA: Diagnosis not present

## 2020-11-15 DIAGNOSIS — Z48812 Encounter for surgical aftercare following surgery on the circulatory system: Secondary | ICD-10-CM | POA: Diagnosis not present

## 2020-11-15 DIAGNOSIS — Z992 Dependence on renal dialysis: Secondary | ICD-10-CM | POA: Diagnosis not present

## 2020-11-15 DIAGNOSIS — N2581 Secondary hyperparathyroidism of renal origin: Secondary | ICD-10-CM | POA: Diagnosis not present

## 2020-11-15 DIAGNOSIS — Z4781 Encounter for orthopedic aftercare following surgical amputation: Secondary | ICD-10-CM | POA: Diagnosis not present

## 2020-11-15 NOTE — Telephone Encounter (Signed)
Attempted to phone calls to patients daughter as requested. Mailbox not setup. Unable to leave message

## 2020-11-15 NOTE — Chronic Care Management (AMB) (Signed)
  Care Management   Note  11/15/2020 Name: ARLIS FITZMAURICE MRN: [id number] DOB: [birth year]  Carolynne Edouard is a 65 y.o. year old female who is a primary care patient of [name], [name], MD and is actively engaged with the care management team. I reached out to Carolynne Edouard by phone today to assist with re-scheduling a follow up visit with the RN Case Manager  Follow up plan: Unsuccessful telephone outreach attempt made. A HIPAA compliant phone message was left for the patient providing contact information and requesting a return call.  The care management team will reach out to the patient again over the next 7 days.  If patient returns call to provider office, please advise to call [facility]  at [place], [address], [place], [place] [address] Direct Dial: [phone] [place] [email or web address] Website: [email or web address]

## 2020-11-16 DIAGNOSIS — Z48812 Encounter for surgical aftercare following surgery on the circulatory system: Secondary | ICD-10-CM | POA: Diagnosis not present

## 2020-11-16 DIAGNOSIS — Z4801 Encounter for change or removal of surgical wound dressing: Secondary | ICD-10-CM | POA: Diagnosis not present

## 2020-11-16 DIAGNOSIS — J9601 Acute respiratory failure with hypoxia: Secondary | ICD-10-CM | POA: Diagnosis not present

## 2020-11-16 DIAGNOSIS — I480 Paroxysmal atrial fibrillation: Secondary | ICD-10-CM | POA: Diagnosis not present

## 2020-11-16 DIAGNOSIS — J189 Pneumonia, unspecified organism: Secondary | ICD-10-CM | POA: Diagnosis not present

## 2020-11-16 DIAGNOSIS — Z4781 Encounter for orthopedic aftercare following surgical amputation: Secondary | ICD-10-CM | POA: Diagnosis not present

## 2020-11-17 ENCOUNTER — Telehealth: Payer: Self-pay | Admitting: Family Medicine

## 2020-11-17 DIAGNOSIS — N2581 Secondary hyperparathyroidism of renal origin: Secondary | ICD-10-CM | POA: Diagnosis not present

## 2020-11-17 DIAGNOSIS — J45909 Unspecified asthma, uncomplicated: Secondary | ICD-10-CM | POA: Diagnosis not present

## 2020-11-17 DIAGNOSIS — D509 Iron deficiency anemia, unspecified: Secondary | ICD-10-CM | POA: Diagnosis not present

## 2020-11-17 DIAGNOSIS — I509 Heart failure, unspecified: Secondary | ICD-10-CM | POA: Diagnosis not present

## 2020-11-17 DIAGNOSIS — Z992 Dependence on renal dialysis: Secondary | ICD-10-CM | POA: Diagnosis not present

## 2020-11-17 DIAGNOSIS — I70201 Unspecified atherosclerosis of native arteries of extremities, right leg: Secondary | ICD-10-CM | POA: Diagnosis not present

## 2020-11-17 DIAGNOSIS — I70268 Atherosclerosis of native arteries of extremities with gangrene, other extremity: Secondary | ICD-10-CM | POA: Diagnosis not present

## 2020-11-17 DIAGNOSIS — L89150 Pressure ulcer of sacral region, unstageable: Secondary | ICD-10-CM | POA: Diagnosis not present

## 2020-11-17 DIAGNOSIS — E871 Hypo-osmolality and hyponatremia: Secondary | ICD-10-CM | POA: Diagnosis not present

## 2020-11-17 DIAGNOSIS — N186 End stage renal disease: Secondary | ICD-10-CM | POA: Diagnosis not present

## 2020-11-17 DIAGNOSIS — N179 Acute kidney failure, unspecified: Secondary | ICD-10-CM | POA: Diagnosis not present

## 2020-11-17 DIAGNOSIS — I480 Paroxysmal atrial fibrillation: Secondary | ICD-10-CM | POA: Diagnosis not present

## 2020-11-17 DIAGNOSIS — L89220 Pressure ulcer of left hip, unstageable: Secondary | ICD-10-CM | POA: Diagnosis not present

## 2020-11-17 DIAGNOSIS — Z4801 Encounter for change or removal of surgical wound dressing: Secondary | ICD-10-CM | POA: Diagnosis not present

## 2020-11-17 DIAGNOSIS — J189 Pneumonia, unspecified organism: Secondary | ICD-10-CM | POA: Diagnosis not present

## 2020-11-17 DIAGNOSIS — Z48812 Encounter for surgical aftercare following surgery on the circulatory system: Secondary | ICD-10-CM | POA: Diagnosis not present

## 2020-11-17 DIAGNOSIS — G709 Myoneural disorder, unspecified: Secondary | ICD-10-CM | POA: Diagnosis not present

## 2020-11-17 DIAGNOSIS — E1122 Type 2 diabetes mellitus with diabetic chronic kidney disease: Secondary | ICD-10-CM | POA: Diagnosis not present

## 2020-11-17 DIAGNOSIS — E1151 Type 2 diabetes mellitus with diabetic peripheral angiopathy without gangrene: Secondary | ICD-10-CM | POA: Diagnosis not present

## 2020-11-17 DIAGNOSIS — D631 Anemia in chronic kidney disease: Secondary | ICD-10-CM | POA: Diagnosis not present

## 2020-11-17 DIAGNOSIS — J9601 Acute respiratory failure with hypoxia: Secondary | ICD-10-CM | POA: Diagnosis not present

## 2020-11-17 DIAGNOSIS — E785 Hyperlipidemia, unspecified: Secondary | ICD-10-CM | POA: Diagnosis not present

## 2020-11-17 DIAGNOSIS — L98499 Non-pressure chronic ulcer of skin of other sites with unspecified severity: Secondary | ICD-10-CM | POA: Diagnosis not present

## 2020-11-17 DIAGNOSIS — M1990 Unspecified osteoarthritis, unspecified site: Secondary | ICD-10-CM | POA: Diagnosis not present

## 2020-11-17 DIAGNOSIS — Z4781 Encounter for orthopedic aftercare following surgical amputation: Secondary | ICD-10-CM | POA: Diagnosis not present

## 2020-11-17 DIAGNOSIS — E1165 Type 2 diabetes mellitus with hyperglycemia: Secondary | ICD-10-CM | POA: Diagnosis not present

## 2020-11-17 DIAGNOSIS — I110 Hypertensive heart disease with heart failure: Secondary | ICD-10-CM | POA: Diagnosis not present

## 2020-11-17 NOTE — Telephone Encounter (Signed)
Daughter called--pt needs rx for egg crate mattress topper for bed sores. Also for her wheelchair. Use [employer] in [place].

## 2020-11-20 ENCOUNTER — Other Ambulatory Visit: Payer: Self-pay | Admitting: *Deleted

## 2020-11-20 ENCOUNTER — Other Ambulatory Visit: Payer: Self-pay | Admitting: Family Medicine

## 2020-11-20 DIAGNOSIS — Z4801 Encounter for change or removal of surgical wound dressing: Secondary | ICD-10-CM | POA: Diagnosis not present

## 2020-11-20 DIAGNOSIS — I480 Paroxysmal atrial fibrillation: Secondary | ICD-10-CM | POA: Diagnosis not present

## 2020-11-20 DIAGNOSIS — J9601 Acute respiratory failure with hypoxia: Secondary | ICD-10-CM | POA: Diagnosis not present

## 2020-11-20 DIAGNOSIS — N2581 Secondary hyperparathyroidism of renal origin: Secondary | ICD-10-CM | POA: Diagnosis not present

## 2020-11-20 DIAGNOSIS — N186 End stage renal disease: Secondary | ICD-10-CM | POA: Diagnosis not present

## 2020-11-20 DIAGNOSIS — Z4781 Encounter for orthopedic aftercare following surgical amputation: Secondary | ICD-10-CM | POA: Diagnosis not present

## 2020-11-20 DIAGNOSIS — D509 Iron deficiency anemia, unspecified: Secondary | ICD-10-CM | POA: Diagnosis not present

## 2020-11-20 DIAGNOSIS — Z48812 Encounter for surgical aftercare following surgery on the circulatory system: Secondary | ICD-10-CM | POA: Diagnosis not present

## 2020-11-20 DIAGNOSIS — J189 Pneumonia, unspecified organism: Secondary | ICD-10-CM | POA: Diagnosis not present

## 2020-11-20 DIAGNOSIS — L8990 Pressure ulcer of unspecified site, unspecified stage: Secondary | ICD-10-CM

## 2020-11-20 DIAGNOSIS — Z992 Dependence on renal dialysis: Secondary | ICD-10-CM | POA: Diagnosis not present

## 2020-11-20 DIAGNOSIS — Z89511 Acquired absence of right leg below knee: Secondary | ICD-10-CM

## 2020-11-20 DIAGNOSIS — D631 Anemia in chronic kidney disease: Secondary | ICD-10-CM | POA: Diagnosis not present

## 2020-11-20 DIAGNOSIS — Z89519 Acquired absence of unspecified leg below knee: Secondary | ICD-10-CM

## 2020-11-20 MED ORDER — EGG CRATE BED PAD MISC: 0 refills | Status: AC

## 2020-11-20 NOTE — Telephone Encounter (Signed)
Orders printed

## 2020-11-21 ENCOUNTER — Ambulatory Visit: Payer: Medicare Other

## 2020-11-21 NOTE — Chronic Care Management (AMB) (Signed)
  Care Management   Note  11/21/2020 Name: ELLANORA TANIS MRN: [id number] DOB: Jul 01, 1955  Carolynne Edouard is a 65 y.o. year old female who is a primary care patient of [name], [name], MD and is actively engaged with the care management team. I reached out to Carolynne Edouard by phone today to assist with scheduling a follow up visit with the RN Case Manager  Follow up plan: Telephone appointment with care management team member scheduled for:11/24/2020  Noreene Larsson, [place], [place], [place] [address] Direct Dial: [phone] [place] [email or web address] Website: [email or web address]

## 2020-11-22 ENCOUNTER — Telehealth: Payer: Self-pay | Admitting: *Deleted

## 2020-11-22 DIAGNOSIS — J9601 Acute respiratory failure with hypoxia: Secondary | ICD-10-CM | POA: Diagnosis not present

## 2020-11-22 DIAGNOSIS — Z4781 Encounter for orthopedic aftercare following surgical amputation: Secondary | ICD-10-CM | POA: Diagnosis not present

## 2020-11-22 DIAGNOSIS — D631 Anemia in chronic kidney disease: Secondary | ICD-10-CM | POA: Diagnosis not present

## 2020-11-22 DIAGNOSIS — N186 End stage renal disease: Secondary | ICD-10-CM | POA: Diagnosis not present

## 2020-11-22 DIAGNOSIS — Z992 Dependence on renal dialysis: Secondary | ICD-10-CM | POA: Diagnosis not present

## 2020-11-22 DIAGNOSIS — D509 Iron deficiency anemia, unspecified: Secondary | ICD-10-CM | POA: Diagnosis not present

## 2020-11-22 DIAGNOSIS — L8990 Pressure ulcer of unspecified site, unspecified stage: Secondary | ICD-10-CM

## 2020-11-22 DIAGNOSIS — J189 Pneumonia, unspecified organism: Secondary | ICD-10-CM | POA: Diagnosis not present

## 2020-11-22 DIAGNOSIS — I480 Paroxysmal atrial fibrillation: Secondary | ICD-10-CM | POA: Diagnosis not present

## 2020-11-22 DIAGNOSIS — Z48812 Encounter for surgical aftercare following surgery on the circulatory system: Secondary | ICD-10-CM | POA: Diagnosis not present

## 2020-11-22 DIAGNOSIS — N2581 Secondary hyperparathyroidism of renal origin: Secondary | ICD-10-CM | POA: Diagnosis not present

## 2020-11-22 DIAGNOSIS — Z4801 Encounter for change or removal of surgical wound dressing: Secondary | ICD-10-CM | POA: Diagnosis not present

## 2020-11-22 NOTE — Telephone Encounter (Signed)
Call from [facility], wanting to change order from egg crate mattress topper to a gel overlay, this will help better with her bed sores and will last longer. Order faxed to [facility]

## 2020-11-22 NOTE — Telephone Encounter (Signed)
Spoke with daughter appointment scheduled 11/23/2020 with Dr. Livia Snellen at 9:10 am

## 2020-11-23 ENCOUNTER — Emergency Department (HOSPITAL_COMMUNITY)
Admission: EM | Admit: 2020-11-23 | Discharge: 2020-11-24 | Disposition: A | Discharge status: Home / Self Care | Payer: Medicare Other | Source: Home / Self Care | Attending: Emergency Medicine | Admitting: Emergency Medicine

## 2020-11-23 ENCOUNTER — Encounter (HOSPITAL_COMMUNITY): Payer: Self-pay

## 2020-11-23 ENCOUNTER — Ambulatory Visit (INDEPENDENT_AMBULATORY_CARE_PROVIDER_SITE_OTHER): Payer: Medicare Other

## 2020-11-23 ENCOUNTER — Ambulatory Visit (INDEPENDENT_AMBULATORY_CARE_PROVIDER_SITE_OTHER): Payer: Medicare Other | Admitting: Family Medicine

## 2020-11-23 ENCOUNTER — Other Ambulatory Visit: Payer: Self-pay

## 2020-11-23 ENCOUNTER — Other Ambulatory Visit: Payer: Self-pay | Admitting: Family Medicine

## 2020-11-23 ENCOUNTER — Emergency Department (HOSPITAL_COMMUNITY): Payer: Medicare Other

## 2020-11-23 ENCOUNTER — Encounter: Payer: Self-pay | Admitting: Family Medicine

## 2020-11-23 VITALS — BP 127/63 | HR 80 | Temp 98.2°F

## 2020-11-23 DIAGNOSIS — Z515 Encounter for palliative care: Secondary | ICD-10-CM | POA: Diagnosis not present

## 2020-11-23 DIAGNOSIS — E1169 Type 2 diabetes mellitus with other specified complication: Secondary | ICD-10-CM | POA: Insufficient documentation

## 2020-11-23 DIAGNOSIS — Z79899 Other long term (current) drug therapy: Secondary | ICD-10-CM | POA: Insufficient documentation

## 2020-11-23 DIAGNOSIS — D631 Anemia in chronic kidney disease: Secondary | ICD-10-CM | POA: Diagnosis not present

## 2020-11-23 DIAGNOSIS — IMO0002 Reserved for concepts with insufficient information to code with codable children: Secondary | ICD-10-CM

## 2020-11-23 DIAGNOSIS — E785 Hyperlipidemia, unspecified: Secondary | ICD-10-CM | POA: Insufficient documentation

## 2020-11-23 DIAGNOSIS — J45909 Unspecified asthma, uncomplicated: Secondary | ICD-10-CM | POA: Insufficient documentation

## 2020-11-23 DIAGNOSIS — R4182 Altered mental status, unspecified: Secondary | ICD-10-CM | POA: Insufficient documentation

## 2020-11-23 DIAGNOSIS — E1129 Type 2 diabetes mellitus with other diabetic kidney complication: Secondary | ICD-10-CM | POA: Insufficient documentation

## 2020-11-23 DIAGNOSIS — I491 Atrial premature depolarization: Secondary | ICD-10-CM | POA: Diagnosis not present

## 2020-11-23 DIAGNOSIS — I70268 Atherosclerosis of native arteries of extremities with gangrene, other extremity: Secondary | ICD-10-CM

## 2020-11-23 DIAGNOSIS — I480 Paroxysmal atrial fibrillation: Secondary | ICD-10-CM | POA: Diagnosis not present

## 2020-11-23 DIAGNOSIS — Z992 Dependence on renal dialysis: Secondary | ICD-10-CM

## 2020-11-23 DIAGNOSIS — J9601 Acute respiratory failure with hypoxia: Secondary | ICD-10-CM | POA: Diagnosis not present

## 2020-11-23 DIAGNOSIS — N186 End stage renal disease: Secondary | ICD-10-CM | POA: Insufficient documentation

## 2020-11-23 DIAGNOSIS — Y900 Blood alcohol level of less than 20 mg/100 ml: Secondary | ICD-10-CM | POA: Insufficient documentation

## 2020-11-23 DIAGNOSIS — R531 Weakness: Secondary | ICD-10-CM | POA: Diagnosis not present

## 2020-11-23 DIAGNOSIS — I509 Heart failure, unspecified: Secondary | ICD-10-CM | POA: Insufficient documentation

## 2020-11-23 DIAGNOSIS — E1122 Type 2 diabetes mellitus with diabetic chronic kidney disease: Secondary | ICD-10-CM

## 2020-11-23 DIAGNOSIS — Z87891 Personal history of nicotine dependence: Secondary | ICD-10-CM | POA: Insufficient documentation

## 2020-11-23 DIAGNOSIS — I517 Cardiomegaly: Secondary | ICD-10-CM | POA: Diagnosis not present

## 2020-11-23 DIAGNOSIS — Z89511 Acquired absence of right leg below knee: Secondary | ICD-10-CM | POA: Diagnosis not present

## 2020-11-23 DIAGNOSIS — R251 Tremor, unspecified: Secondary | ICD-10-CM

## 2020-11-23 DIAGNOSIS — Z48812 Encounter for surgical aftercare following surgery on the circulatory system: Secondary | ICD-10-CM | POA: Diagnosis not present

## 2020-11-23 DIAGNOSIS — Z4781 Encounter for orthopedic aftercare following surgical amputation: Secondary | ICD-10-CM | POA: Diagnosis not present

## 2020-11-23 DIAGNOSIS — R404 Transient alteration of awareness: Secondary | ICD-10-CM

## 2020-11-23 DIAGNOSIS — J189 Pneumonia, unspecified organism: Secondary | ICD-10-CM | POA: Diagnosis not present

## 2020-11-23 DIAGNOSIS — Z89519 Acquired absence of unspecified leg below knee: Secondary | ICD-10-CM

## 2020-11-23 DIAGNOSIS — N185 Chronic kidney disease, stage 5: Secondary | ICD-10-CM

## 2020-11-23 DIAGNOSIS — I132 Hypertensive heart and chronic kidney disease with heart failure and with stage 5 chronic kidney disease, or end stage renal disease: Secondary | ICD-10-CM | POA: Insufficient documentation

## 2020-11-23 DIAGNOSIS — Z09 Encounter for follow-up examination after completed treatment for conditions other than malignant neoplasm: Secondary | ICD-10-CM

## 2020-11-23 DIAGNOSIS — L8990 Pressure ulcer of unspecified site, unspecified stage: Secondary | ICD-10-CM

## 2020-11-23 DIAGNOSIS — Z4801 Encounter for change or removal of surgical wound dressing: Secondary | ICD-10-CM | POA: Diagnosis not present

## 2020-11-23 DIAGNOSIS — A419 Sepsis, unspecified organism: Secondary | ICD-10-CM | POA: Diagnosis not present

## 2020-11-23 DIAGNOSIS — I96 Gangrene, not elsewhere classified: Secondary | ICD-10-CM | POA: Diagnosis not present

## 2020-11-23 DIAGNOSIS — E1165 Type 2 diabetes mellitus with hyperglycemia: Secondary | ICD-10-CM | POA: Diagnosis not present

## 2020-11-23 DIAGNOSIS — Z20822 Contact with and (suspected) exposure to covid-19: Secondary | ICD-10-CM | POA: Insufficient documentation

## 2020-11-23 DIAGNOSIS — I10 Essential (primary) hypertension: Secondary | ICD-10-CM | POA: Diagnosis not present

## 2020-11-23 DIAGNOSIS — J9811 Atelectasis: Secondary | ICD-10-CM | POA: Diagnosis not present

## 2020-11-23 DIAGNOSIS — R0902 Hypoxemia: Secondary | ICD-10-CM | POA: Diagnosis not present

## 2020-11-23 DIAGNOSIS — J90 Pleural effusion, not elsewhere classified: Secondary | ICD-10-CM | POA: Diagnosis not present

## 2020-11-23 DIAGNOSIS — R6521 Severe sepsis with septic shock: Secondary | ICD-10-CM | POA: Diagnosis not present

## 2020-11-23 DIAGNOSIS — Z66 Do not resuscitate: Secondary | ICD-10-CM | POA: Diagnosis not present

## 2020-11-23 LAB — COMPREHENSIVE METABOLIC PANEL
ALT: 10 U/L (ref 0–44)
AST: 13 U/L — ABNORMAL LOW (ref 15–41)
Albumin: 2 g/dL — ABNORMAL LOW (ref 3.5–5.0)
Alkaline Phosphatase: 68 U/L (ref 38–126)
Anion gap: 12 (ref 5–15)
BUN: 51 mg/dL — ABNORMAL HIGH (ref 8–23)
CO2: 26 mmol/L (ref 22–32)
Calcium: 8.3 mg/dL — ABNORMAL LOW (ref 8.9–10.3)
Chloride: 96 mmol/L — ABNORMAL LOW (ref 98–111)
Creatinine, Ser: 9.28 mg/dL — ABNORMAL HIGH (ref 0.44–1.00)
GFR, Estimated: 4 mL/min — ABNORMAL LOW (ref >60)
Glucose, Bld: 100 mg/dL — ABNORMAL HIGH (ref 70–99)
Potassium: 4 mmol/L (ref 3.5–5.1)
Sodium: 134 mmol/L — ABNORMAL LOW (ref 135–145)
Total Bilirubin: 0.7 mg/dL (ref 0.3–1.2)
Total Protein: 6.8 g/dL (ref 6.5–8.1)

## 2020-11-23 LAB — CBC WITH DIFFERENTIAL/PLATELET
Abs Immature Granulocytes: 0.22 10*3/uL — ABNORMAL HIGH (ref 0.00–0.07)
Basophils Absolute: 0.1 10*3/uL (ref 0.0–0.1)
Basophils Relative: 0 %
Eosinophils Absolute: 0.4 10*3/uL (ref 0.0–0.5)
Eosinophils Relative: 2 %
HCT: 25.2 % — ABNORMAL LOW (ref 36.0–46.0)
Hemoglobin: 7.9 g/dL — ABNORMAL LOW (ref 12.0–15.0)
Immature Granulocytes: 1 %
Lymphocytes Relative: 9 %
Lymphs Abs: 2.1 10*3/uL (ref 0.7–4.0)
MCH: 24.6 pg — ABNORMAL LOW (ref 26.0–34.0)
MCHC: 31.3 g/dL (ref 30.0–36.0)
MCV: 78.5 fL — ABNORMAL LOW (ref 80.0–100.0)
Monocytes Absolute: 1.5 10*3/uL — ABNORMAL HIGH (ref 0.1–1.0)
Monocytes Relative: 6 %
Neutro Abs: 18.6 10*3/uL — ABNORMAL HIGH (ref 1.7–7.7)
Neutrophils Relative %: 82 %
Platelets: 770 10*3/uL — ABNORMAL HIGH (ref 150–400)
RBC: 3.21 MIL/uL — ABNORMAL LOW (ref 3.87–5.11)
RDW: 19.9 % — ABNORMAL HIGH (ref 11.5–15.5)
WBC: 22.8 10*3/uL — ABNORMAL HIGH (ref 4.0–10.5)
nRBC: 0.2 % (ref 0.0–0.2)

## 2020-11-23 LAB — PROTIME-INR
INR: 2.7 — ABNORMAL HIGH (ref 0.8–1.2)
Prothrombin Time: 28.8 seconds — ABNORMAL HIGH (ref 11.4–15.2)

## 2020-11-23 LAB — LACTIC ACID, PLASMA: Lactic Acid, Venous: 1.6 mmol/L (ref 0.5–1.9)

## 2020-11-23 LAB — LIPASE, BLOOD: Lipase: 20 U/L (ref 11–51)

## 2020-11-23 LAB — RESP PANEL BY RT-PCR (FLU A&B, COVID) ARPGX2
Influenza A by PCR: NEGATIVE
Influenza B by PCR: NEGATIVE
SARS Coronavirus 2 by RT PCR: NEGATIVE

## 2020-11-23 LAB — ETHANOL: Alcohol, Ethyl (B): <10 mg/dL (ref <10)

## 2020-11-23 NOTE — Discharge Instructions (Addendum)
You were seen in the emergency room today with some tremor and occasional confusion.  Your lab work here is not showing signs of sepsis.  Your x-ray does not show pneumonia.  Your CT scan of the head was normal.  We will discharge you home and have you continue your scheduled follow-up with your specialists and continue your dialysis.  Return to the emergency department any new or suddenly worsening symptoms.

## 2020-11-23 NOTE — ED Provider Notes (Signed)
Emergency Department Provider Note   I have reviewed the triage vital signs and the nursing notes.   HISTORY  Chief Complaint Altered Mental Status   HPI Christy Zhang is a 65 y.o. female presents to the ED with tremors at home. She went to HD today and had a full session. She lives with her daughter who reports occasional tremors without LOC at home. Patient tells me that "I feel fine" and wants to go home. She has know sacral decubitus wounds but denies sudden worsening pain. Denies CP or SOB. No fever or chills. Arrives by EMS with no intervention en route. No radiation of symptoms or modifying factors.    Past Medical History:  Diagnosis Date   Anemia    Arthritis    Asthma    in the past   CHF (congestive heart failure) ([place])    Diabetes mellitus with complication ([place])    Type 2   End stage renal disease on dialysis ([place])    Gangrene ([place])    Hyperlipidemia    Hypertension     Patient Active Problem List   Diagnosis Date Noted   Paroxysmal atrial fibrillation ([place])    Cardiac arrest ([place])    CAP (community acquired pneumonia) 10/27/2020   Altered mental status 10/27/2020   Acute respiratory failure with hypoxemia ([place]) 10/27/2020   Lactic acidosis 10/27/2020   Hypoalbuminemia 10/27/2020   Hyponatremia 10/27/2020   Hyperlipidemia 10/27/2020   S/P BKA (below knee amputation) unilateral, right ([place]) 09/14/2020   Diabetic foot infection ([place])    Medication monitoring encounter    Pressure injury of skin 08/11/2020   Thrombocytosis 08/09/2020   Osteomyelitis of right foot ([place]) 08/08/2020   Leucocytosis 07/05/2020   Below-knee amputation of left lower extremity ([place]) 06/16/2020   S/P BKA (below knee amputation) unilateral ([place]) 06/08/2020   Ischemic ulcer of toe of right foot ([place]) 04/13/2020   PVD (peripheral vascular disease)/ S/p BKA 04/13/2020   ESRD on hemodialysis ([place]) 01/27/2020   Abdominal wall cellulitis    Panniculitis 12/29/2019    Osteomyelitis ([place]) 10/15/2019   Hyperglycemia due to diabetes mellitus ([place]) 10/15/2019   Osteomyelitis of third toe of right foot ([place]) 10/14/2019   ESRD (end stage renal disease) ([place])    Asthma, mild persistent 07/25/2015   Hypertension secondary to other renal disorders 07/25/2015   Controlled type 2 diabetes mellitus with complication, with Ketan Renz-term current use of insulin ([place]) 03/30/2015   Anemia of chronic disease 02/17/2015   Osteopathy in diseases classified elsewhere, unspecified site 02/17/2015   Essential (primary) hypertension 06/24/2014   Heart failure ([place]) 06/24/2014   Obesity, Class III, BMI 40-49.9 (morbid obesity) ([place]) 06/24/2014    Past Surgical History:  Procedure Laterality Date    LEFT BELOW KNEE AMPUTATION (Left Knee)  06/08/2020   A/V FISTULAGRAM Right 02/04/2017   Procedure: A/V Fistulagram;  Surgeon: Waynetta Sandy, MD;  Location: [facility];  Service: Cardiovascular;  Laterality: Right;   ABDOMINAL AORTOGRAM W/LOWER EXTREMITY N/A 11/18/2019   Procedure: ABDOMINAL AORTOGRAM W/LOWER EXTREMITY;  Surgeon: [name], MD;  Location: [facility];  Service: Cardiovascular;  Laterality: N/A;   ABDOMINAL AORTOGRAM W/LOWER EXTREMITY N/A 02/23/2020   Procedure: ABDOMINAL AORTOGRAM W/LOWER EXTREMITY;  Surgeon: [name], MD;  Location: [facility];  Service: Cardiovascular;  Laterality: N/A;  Bilateral   ABDOMINAL AORTOGRAM W/LOWER EXTREMITY N/A 08/10/2020   Procedure: ABDOMINAL AORTOGRAM W/LOWER EXTREMITY;  Surgeon: [name], MD;  Location:  [facility];  Service: Cardiovascular;  Laterality: N/A;   AMPUTATION Left 06/08/2020   Procedure: LEFT BELOW KNEE AMPUTATION;  Surgeon: Cherre Robins, MD;  Location: [facility];  Service: Vascular;  Laterality: Left;   AMPUTATION Right 09/14/2020   Procedure: RIGHT BELOW KNEE AMPUTATION;  Surgeon: Waynetta Sandy, MD;  Location: [facility];  Service: Vascular;   Laterality: Right;   AV FISTULA PLACEMENT Right 08/25/2014   Procedure: RIGHT BRACHIOCEPHALIC ARTERIOVENOUS (AV) FISTULA CREATION;  Surgeon: Mal Misty, MD;  Location: [facility];  Service: Vascular;  Laterality: Right;   AV FISTULA PLACEMENT Right 03/18/2019   Procedure: INSERTION OF ARTERIOVENOUS (AV) GORE-TEX GRAFT RIGHT ARM;  Surgeon: Serafina Mitchell, MD;  Location: [facility];  Service: Vascular;  Laterality: Right;   [place] Right 12/24/2018   Procedure: FIRST STAGE BASILIC VEIN TRANSPOSITION RIGHT UPPER ARM;  Surgeon: Serafina Mitchell, MD;  Location: [facility];  Service: Vascular;  Laterality: Right;   FISTULA SUPERFICIALIZATION Right 11/03/2014   Procedure: RIGHT UPPER ARM FISTULA SUPERFICIALIZATION;  Surgeon: Mal Misty, MD;  Location: [facility];  Service: Vascular;  Laterality: Right;   FISTULOGRAM Right 01/16/2016   Procedure: RIGHT UPPER EXTREMITY ARTERIOVENOUS FISTULOGRAM WITH BALLOON ANGIOPLASTY;  Surgeon: Vickie Epley, MD;  Location: [facility];  Service: Vascular;  Laterality: Right;   INSERTION OF DIALYSIS CATHETER     X4   IR FLUORO GUIDE CV LINE RIGHT  12/03/2018   IR REMOVAL TUN CV CATH W/O FL  07/06/2019   IR THROMBECTOMY AV FISTULA W/THROMBOLYSIS/PTA INC/SHUNT/IMG RIGHT Right 12/03/2018   IR THROMBECTOMY AV FISTULA W/THROMBOLYSIS/PTA/STENT INC/SHUNT/IMG RT Right 10/11/2019   IR US GUIDE VASC ACCESS RIGHT  12/03/2018   IR US GUIDE VASC ACCESS RIGHT  12/03/2018   IR US GUIDE VASC ACCESS RIGHT  10/14/2019   LIGATION OF COMPETING BRANCHES OF ARTERIOVENOUS FISTULA Right 11/03/2014   Procedure: LIGATION OF COMPETING BRANCHE x1 OF RIGHT UPPER ARM ARTERIOVENOUS FISTULA;  Surgeon: Mal Misty, MD;  Location: [facility];  Service: Vascular;  Laterality: Right;   PERIPHERAL VASCULAR BALLOON ANGIOPLASTY Right 02/04/2017   Procedure: PERIPHERAL VASCULAR BALLOON ANGIOPLASTY;  Surgeon: Waynetta Sandy, MD;  Location: [facility];  Service: Cardiovascular;  Laterality: Right;    PERIPHERAL VASCULAR BALLOON ANGIOPLASTY Right 11/18/2019   Procedure: PERIPHERAL VASCULAR BALLOON ANGIOPLASTY;  Surgeon: [name], MD;  Location: [facility];  Service: Cardiovascular;  Laterality: Right;  anterior tibial   PERIPHERAL VASCULAR BALLOON ANGIOPLASTY  02/23/2020   Procedure: PERIPHERAL VASCULAR BALLOON ANGIOPLASTY;  Surgeon: [name], MD;  Location: [facility];  Service: Cardiovascular;;  Lt  AT   PERIPHERAL VASCULAR INTERVENTION Right 08/10/2020   Procedure: PERIPHERAL VASCULAR INTERVENTION;  Surgeon: [name], MD;  Location: [facility];  Service: Cardiovascular;  Laterality: Right;   THROMBECTOMY BRACHIAL ARTERY Left 11/04/2020   Procedure: THROMBECTOMY RADIAL ARTERY with Bovine Patching.;  Surgeon: Cherre Robins, MD;  Location: [facility];  Service: Vascular;  Laterality: Left;   TRANSMETATARSAL AMPUTATION Left 02/24/2020   Procedure: TRANSMETATARSAL AMPUTATION LEFT;  Surgeon: Cherre Robins, MD;  Location: [facility];  Service: Vascular;  Laterality: Left;   TRANSMETATARSAL AMPUTATION Right 08/11/2020   Procedure: RIGHT TRANSMETATARSAL AMPUTATION;  Surgeon: Waynetta Sandy, MD;  Location: [facility];  Service: Vascular;  Laterality: Right;   TUBAL LIGATION      Allergies Sulfa antibiotics  Family History  Problem Relation Age of Onset  Diabetes Mother    Hypertension Mother    Cancer Father    Diabetes Father    Hypertension Sister    Diabetes Daughter    Heart disease Daughter    Hypertension Daughter    Diabetes Son    Heart disease Son    Hypertension Son    Peripheral vascular disease Son        amputation    Social History Social History   Tobacco Use   Smoking status: Former    Types: Cigarettes    Quit date: 07/25/1984    Years since quitting: 36.3   Smokeless tobacco: Never  Vaping Use   Vaping Use: Never used  Substance Use Topics   Alcohol use: No    Alcohol/week: 0.0 standard drinks    Drug use: No    Review of Systems  Constitutional: No fever/chills Eyes: No visual changes. ENT: No sore throat. Cardiovascular: Denies chest pain. Respiratory: Denies shortness of breath. Gastrointestinal: No abdominal pain.  No nausea, no vomiting.  No diarrhea.  No constipation. Genitourinary: Negative for dysuria. Musculoskeletal: Negative for back pain. Skin: Negative for rash. Neurological: Negative for headaches, focal weakness or numbness. Question of tremors per family.   10-point ROS otherwise negative.  ____________________________________________   PHYSICAL EXAM:  VITAL SIGNS: ED Triage Vitals  Enc Vitals Group     BP 11/23/20 2127 (!) 93/38     Pulse Rate 11/23/20 2127 82     Resp 11/23/20 2127 (!) 24     Temp 11/23/20 2134 98.1 F (36.7 C)     Temp Source 11/23/20 2134 Oral     SpO2 11/23/20 2127 92 %     Weight 11/23/20 2128 190 lb (86.2 kg)     Height 11/23/20 2128 '5\' 4"'$  (1.626 m)    Constitutional: Alert and oriented. Well appearing and in no acute distress. Eyes: Conjunctivae are normal. PERRL. EOMI. Head: Atraumatic. Nose: No congestion/rhinnorhea. Mouth/Throat: Mucous membranes are moist.   Neck: No stridor.   Cardiovascular: Normal rate, regular rhythm. Good peripheral circulation. Grossly normal heart sounds.   Respiratory: Normal respiratory effort.  No retractions. Lungs CTAB. Gastrointestinal: Soft and nontender. No distention.  Musculoskeletal: Bilateral AKAs noted. Baseline wound to the left hand s/p ischemic process during last hospitalization.  Neurologic:  Normal speech and language. No gross focal neurologic deficits are appreciated.  Skin:  Skin is warm and dry. Baseline necrotic tissue to the left hand.   ____________________________________________   LABS (all labs ordered are listed, but only abnormal results are displayed)  Labs Reviewed  COMPREHENSIVE METABOLIC PANEL - Abnormal; Notable for the following components:       Result Value   Sodium 134 (*)    Chloride 96 (*)    Glucose, Bld 100 (*)    BUN 51 (*)    Creatinine, Ser 9.28 (*)    Calcium 8.3 (*)    Albumin 2.0 (*)    AST 13 (*)    GFR, Estimated 4 (*)    All other components within normal limits  CBC WITH DIFFERENTIAL/PLATELET - Abnormal; Notable for the following components:   WBC 22.8 (*)    RBC 3.21 (*)    Hemoglobin 7.9 (*)    HCT 25.2 (*)    MCV 78.5 (*)    MCH 24.6 (*)    RDW 19.9 (*)    Platelets 770 (*)    Neutro Abs 18.6 (*)    Monocytes Absolute 1.5 (*)    Abs Immature  Granulocytes 0.22 (*)    All other components within normal limits  PROTIME-INR - Abnormal; Notable for the following components:   Prothrombin Time 28.8 (*)    INR 2.7 (*)    All other components within normal limits  CULTURE, BLOOD (ROUTINE X 2)  RESP PANEL BY RT-PCR (FLU A&B, COVID) ARPGX2  LIPASE, BLOOD  LACTIC ACID, PLASMA  ETHANOL   ____________________________________________  EKG   EKG Interpretation  Date/Time:  Thursday November 23 2020 22:07:03 EDT Ventricular Rate:  82 PR Interval:  154 QRS Duration: 94 QT Interval:  384 QTC Calculation: 448 R Axis:   87 Text Interpretation: Normal sinus rhythm Cannot rule out Anterior infarct , age undetermined T wave abnormality, consider inferolateral ischemia Abnormal ECG No acute changes Nonspecific T wave abnormality No significant change since last tracing Confirmed by Varney Biles [phone]) on 11/24/2020 4:30:53 PM        ____________________________________________  RADIOLOGY  DG Chest 1 View  Result Date: 11/23/2020 CLINICAL DATA:  Follow-up pneumonia. EXAM: CHEST  1 VIEW COMPARISON:  11/04/2020.  11/03/2020.  CT 11/02/2020. FINDINGS: Interim extubation and removal of left IJ line and NG tube. Left base and infiltrate. Small left pleural effusion. No pneumothorax. Vascular stents noted over the right subclavian region in stable position. IMPRESSION: 1.  Interim extubation removal of left IJ  line and NG tube. 2. Left base atelectasis and infiltrate. Small left pleural effusion. These findings are new from prior study of 11/04/2020. Electronically Signed   By: Marcello Moores  Register   On: 11/23/2020 14:21    ____________________________________________   PROCEDURES  Procedure(s) performed:   Procedures  None ____________________________________________   INITIAL IMPRESSION / ASSESSMENT AND PLAN / ED COURSE  Pertinent labs & imaging results that were available during my care of the patient were reviewed by me and considered in my medical decision making (see chart for details).   Patient arrives with report of tremors at home. No LOC of clear seizure activity. Patient is chronically very ill. No findings on labs here to suggest sepsis, dehydration, or ACS/PE. Patient established with wounds care, vascular surgery, and ortho for her chronic conditions. Spoke with the patient's daughter by phone to review symptoms, labs, and follow up plan. She is comfortable with having the patient return home to continue her outpatient treatment and ESRD.    ____________________________________________  FINAL CLINICAL IMPRESSION(S) / ED DIAGNOSES  Final diagnoses:  Occasional tremors  Transient alteration of awareness     Note:  This document was prepared using Dragon voice recognition software and may include unintentional dictation errors.  Nanda Quinton, MD, [facility] Emergency Medicine    Hiroki Wint, Wonda Old[facility], MD 12/04/2020 [phone]

## 2020-11-23 NOTE — ED Triage Notes (Signed)
Pt arrived via [facility] alert et oriented to person only. Pt states that she has multiple bed sores. Left hand noted to be open in several areas with thumb being black in color.

## 2020-11-23 NOTE — Progress Notes (Signed)
Subjective:  Patient ID: Christy Zhang, female    DOB: 1956-02-27  Age: 65 y.o. MRN: [id number]  CC: Hospitalization Follow-up   HPI Christy Zhang presents for follow-up of recent hospitalization from July 1 through July 19 for shortness of breath and pneumonia.  She became septic.  She underwent mechanical ventilation during her stay.  She was extubated prior to discharge.  According to the attached discharge summary she finished an entire course of antibiotics while hospitalized.  She was weaned to room air as well.  She also developed gangrenous blood clot of the  left hand and wrist.   She says she is no longer on antibiotics for that.  She is not having any respiratory distress or cough.  Her main concern now is for the bedsores.  They are very painful on her bottom.  She is laying in bed taking pressure off of them and home health is coming in and dressing them for her.  They just do not seem to be healing as yet.  Patient is a dialysis patient.  She continues to go for those treatments on Mondays Wednesdays and Fridays.  Additionally she is supposed to go to a place in [place] for care.  She is unclear exactly what is to be done there.  Apparently was arranged while hospitalized.  Plan is to follow-up next week to determine the fate of her left hand with possible amputation.  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  Vascular surgery has been following as well as orthopedics.   While hospitalized she had new onset of atrial fib flutter.  She was started on amiodarone and Eliquis.  Patient underwent CT chest abdomen and pelvis on July 7.  A 3.5 cm heterogeneous abnormality was noted at the left lower pole of the left kidney possibly neoplastic.  MRI with gadolinium was recommended.  Discharge summary stated that should be done as an outpatient.  She is scheduled to see oncology, Dr. Janey Genta on August 9.   She has a follow-up with vein and vascular on August 10.  In the meantime she is to recuperate as best she can and home health is following her.  I do not see the wound center consult will need to at that.  Depression screen [facility] 2/9 11/23/2020 11/06/2020 10/18/2020  Decreased Interest 0 1 0  Down, Depressed, Hopeless 0 1 0  PHQ - 2 Score 0 2 0  Altered sleeping 3 0 -  Tired, decreased energy 3 1 -  Change in appetite 0 0 -  Feeling bad or failure about yourself  0 1 -  Trouble concentrating 0 0 -  Moving slowly or fidgety/restless 1 1 -  Suicidal thoughts 0 0 -  PHQ-9 Score 7 5 -  Difficult doing work/chores Very difficult Somewhat difficult -  Some recent data might be hidden    History Christy Zhang has a past medical history of Anemia, Arthritis, Asthma, CHF (congestive heart failure) ([place]), Diabetes mellitus with complication ([place]), End stage renal disease on dialysis ([place]), Gangrene ([place]), Hyperlipidemia, and Hypertension.   She has a past surgical history that includes Insertion of dialysis catheter; Tubal ligation; AV fistula placement (Right, 08/25/2014); Fistula superficialization (Right, 11/03/2014); Ligation of competing branches of arteriovenous fistula (Right, 11/03/2014); Fistulogram (Right, 01/16/2016); A/V Fistulagram (Right, 02/04/2017); PERIPHERAL VASCULAR BALLOON ANGIOPLASTY (Right, 02/04/2017); IR THROMBECTOMY AV FISTULA/W THROMBOLYSIS/PTA INC SHUNT/IMG RIGHT (Right, 12/03/2018); IR US Guide Vasc Access  Right (12/03/2018); IR Fluoro Guide CV Line Right (12/03/2018); IR US Guide Vasc Access Right (12/03/2018); Bascilic vein transposition (Right, 12/24/2018); AV fistula placement (Right, 03/18/2019); IR Removal Tun Cv Cath W/O FL (07/06/2019); IR THROMBECTOMY AV FISTULA W/THROMBOLYSIS/PTA/STENT INC SHUNT/IMG RT (Right, 10/11/2019); IR US Guide Vasc Access Right (10/14/2019); ABDOMINAL AORTOGRAM W/LOWER EXTREMITY (N/A, 11/18/2019); PERIPHERAL VASCULAR BALLOON ANGIOPLASTY (Right, 11/18/2019); ABDOMINAL  AORTOGRAM W/LOWER EXTREMITY (N/A, 02/23/2020); PERIPHERAL VASCULAR BALLOON ANGIOPLASTY (02/23/2020); Transmetatarsal amputation (Left, 02/24/2020);  LEFT BELOW KNEE AMPUTATION (Left Knee) (06/08/2020); Amputation (Left, 06/08/2020); ABDOMINAL AORTOGRAM W/LOWER EXTREMITY (N/A, 08/10/2020); PERIPHERAL VASCULAR INTERVENTION (Right, 08/10/2020); Transmetatarsal amputation (Right, 08/11/2020); Amputation (Right, 09/14/2020); and Thrombectomy brachial artery (Left, 11/04/2020).   Her family history includes Cancer in her father; Diabetes in her daughter, father, mother, and son; Heart disease in her daughter and son; Hypertension in her daughter, mother, sister, and son; Peripheral vascular disease in her son.She reports that she quit smoking about 36 years ago. Her smoking use included cigarettes. She has never used smokeless tobacco. She reports that she does not drink alcohol and does not use drugs.    ROS Review of Systems  Constitutional:  Positive for activity change, appetite change and fatigue.  HENT: Negative.    Eyes:  Negative for visual disturbance.  Respiratory:  Negative for shortness of breath.   Cardiovascular:  Negative for chest pain.  Gastrointestinal:  Negative for abdominal pain.  Musculoskeletal:  Positive for arthralgias and myalgias.   Objective:  BP 127/63   Pulse 80   Temp 98.2 F (36.8 C)   LMP  (LMP Unknown)   SpO2 (!) 87%   BP Readings from Last 3 Encounters:  11/23/20 127/63  11/14/20 (!) 167/61  10/20/20 135/70    Wt Readings from Last 3 Encounters:  11/13/20 198 lb 6.6 oz (90 kg)  09/18/20 207 lb 3.7 oz (94 kg)  09/05/20 215 lb (97.5 kg)     Physical Exam Constitutional:      General: She is not in acute distress.    Appearance: She is well-developed.  HENT:     Head: Normocephalic and atraumatic.  Eyes:     Conjunctiva/sclera: Conjunctivae normal.     Pupils: Pupils are equal, round, and reactive to light.  Neck:     Thyroid: No thyromegaly.   Cardiovascular:     Rate and Rhythm: Normal rate and regular rhythm.     Heart sounds: Normal heart sounds. No murmur heard. Pulmonary:     Effort: Pulmonary effort is normal. No respiratory distress.     Breath sounds: Normal breath sounds. No wheezing or rales.  Abdominal:     General: Bowel sounds are normal. There is no distension.     Palpations: Abdomen is soft.     Tenderness: There is no abdominal tenderness.  Musculoskeletal:        General: Normal range of motion.     Cervical back: Normal range of motion and neck supple.  Lymphadenopathy:     Cervical: No cervical adenopathy.  Skin:    General: Skin is warm and dry.     Findings: Lesion (the right hand has multiple areas of gangrene, particularly at the finger tips and also the entire thumb plus most of the 5th finger) present.  Neurological:     Mental Status: She is alert and oriented to person, place, and time.  Psychiatric:        Behavior: Behavior normal.        Thought Content: Thought content normal.  Judgment: Judgment normal.      Assessment & Plan:   Christy Zhang was seen today for hospitalization follow-up.  Diagnoses and all orders for this visit:  Hospital discharge follow-up -     Cancel: DG Chest 2 View; Future  ESRD (end stage renal disease) ([place])  Pressure injury of skin, unspecified injury stage, unspecified location -     AMB referral to wound care center  S/P BKA (below knee amputation) unilateral (HCC)  S/P BKA (below knee amputation) unilateral, right ([place])  Uncontrolled type 2 diabetes mellitus with stage 5 chronic kidney disease ([place])  Anemia in chronic kidney disease, on chronic dialysis (HCC)  Obesity, Class III, BMI 40-49.9 (morbid obesity) (HCC)  Paroxysmal atrial fibrillation (HCC)  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  She has a kidney mass that is to be addressed by oncology.  That appointment was confirmed to be on August 10    I am having [place] B. Biehn maintain her sevelamer carbonate, diphenhydrAMINE, aspirin EC, polyethylene glycol, senna-docusate, acetaminophen, gabapentin, atorvastatin, Trulicity, insulin detemir, Insulin Syringe-Needle U-100, oxyCODONE, amiodarone, apixaban, midodrine, and Egg Crate Bed Pad.  Allergies as of 11/23/2020       Reactions   Sulfa Antibiotics Swelling        Medication List        Accurate as of November 23, 2020  1:45 PM. If you have any questions, ask your nurse or doctor.          acetaminophen 325 MG tablet Commonly known as: TYLENOL Take 1-2 tablets (325-650 mg total) by mouth every 4 (four) hours as needed for mild pain.   amiodarone 200 MG tablet Commonly known as: PACERONE Take 1 tablet (200 mg total) by mouth daily for 19 days.   apixaban 5 MG Tabs tablet Commonly known as: ELIQUIS Take 1 tablet (5 mg total) by mouth 2 (two) times daily.   aspirin EC 81 MG tablet Take 81 mg by mouth at bedtime. Swallow whole.   atorvastatin 40 MG tablet Commonly known as: LIPITOR TAKE ONE TABLET BY MOUTH DAILY   diphenhydrAMINE 25 MG tablet Commonly known as: BENADRYL Take 25 mg by mouth every 6 (six) hours as needed for itching.   Egg Crate Bed Pad Misc Use to treat and prevent bed sores   gabapentin 300 MG capsule Commonly known as: NEURONTIN Take 1 capsule (300 mg total) by mouth at bedtime.   insulin detemir 100 UNIT/ML injection Commonly known as: Levemir Inject 0.16 mLs (16 Units total) into the skin daily.   Insulin Syringe-Needle U-100 28G X 1/2" 1 ML Misc Use with insulin daily Dx E11.22   midodrine 5 MG tablet Commonly known as: PROAMATINE Take 1 tablet (5 mg total) by mouth 3 (three) times daily with meals.   oxyCODONE 5 MG immediate release tablet Commonly known as: Oxy IR/ROXICODONE Take 1 tablet (5 mg  total) by mouth every 6 (six) hours as needed for severe pain.   polyethylene glycol 17 g packet Commonly known as: MIRALAX / GLYCOLAX Take 17 g by mouth daily.   senna-docusate 8.6-50 MG tablet Commonly known as: Senokot-S Take 1 tablet by mouth 2 (two) times daily.   sevelamer carbonate 800 MG tablet Commonly known as: RENVELA Take 1,600-2,400 mg by mouth See admin instructions. Take 2400 mg with each meal and 1600 mg  with each snack   Trulicity 1.5 0000000 Sopn Generic drug: Dulaglutide INJECT CONTENTs OF ONE PEN UNDER THE SKIN WEEKLY ON SATURDAY         Follow-up: Return in about 6 days (around 11/29/2020) for diabetes (Previously scheduled).  Claretta Fraise, M.D.

## 2020-11-24 ENCOUNTER — Ambulatory Visit: Payer: Medicare Other | Admitting: *Deleted

## 2020-11-24 DIAGNOSIS — IMO0002 Reserved for concepts with insufficient information to code with codable children: Secondary | ICD-10-CM

## 2020-11-24 DIAGNOSIS — E1122 Type 2 diabetes mellitus with diabetic chronic kidney disease: Secondary | ICD-10-CM

## 2020-11-24 DIAGNOSIS — L8990 Pressure ulcer of unspecified site, unspecified stage: Secondary | ICD-10-CM

## 2020-11-24 DIAGNOSIS — N186 End stage renal disease: Secondary | ICD-10-CM

## 2020-11-26 DIAGNOSIS — Z992 Dependence on renal dialysis: Secondary | ICD-10-CM | POA: Diagnosis not present

## 2020-11-26 DIAGNOSIS — N186 End stage renal disease: Secondary | ICD-10-CM | POA: Diagnosis not present

## 2020-11-27 ENCOUNTER — Emergency Department (HOSPITAL_COMMUNITY): Payer: Medicare Other

## 2020-11-27 ENCOUNTER — Other Ambulatory Visit: Payer: Self-pay

## 2020-11-27 ENCOUNTER — Inpatient Hospital Stay (HOSPITAL_COMMUNITY)
Admission: EM | Admit: 2020-11-27 | Discharge: 2020-12-28 | DRG: 853 | Disposition: E | Payer: Medicare Other | Attending: Family Medicine | Admitting: Family Medicine

## 2020-11-27 ENCOUNTER — Inpatient Hospital Stay (HOSPITAL_COMMUNITY): Payer: Medicare Other

## 2020-11-27 ENCOUNTER — Encounter (HOSPITAL_COMMUNITY): Payer: Self-pay | Admitting: Emergency Medicine

## 2020-11-27 ENCOUNTER — Ambulatory Visit: Payer: Medicare Other | Admitting: *Deleted

## 2020-11-27 DIAGNOSIS — I480 Paroxysmal atrial fibrillation: Secondary | ICD-10-CM | POA: Diagnosis not present

## 2020-11-27 DIAGNOSIS — Z6833 Body mass index (BMI) 33.0-33.9, adult: Secondary | ICD-10-CM

## 2020-11-27 DIAGNOSIS — I70262 Atherosclerosis of native arteries of extremities with gangrene, left leg: Secondary | ICD-10-CM | POA: Diagnosis not present

## 2020-11-27 DIAGNOSIS — I70268 Atherosclerosis of native arteries of extremities with gangrene, other extremity: Secondary | ICD-10-CM

## 2020-11-27 DIAGNOSIS — I959 Hypotension, unspecified: Secondary | ICD-10-CM | POA: Diagnosis not present

## 2020-11-27 DIAGNOSIS — R6521 Severe sepsis with septic shock: Secondary | ICD-10-CM | POA: Diagnosis present

## 2020-11-27 DIAGNOSIS — L97119 Non-pressure chronic ulcer of right thigh with unspecified severity: Secondary | ICD-10-CM | POA: Diagnosis not present

## 2020-11-27 DIAGNOSIS — E785 Hyperlipidemia, unspecified: Secondary | ICD-10-CM | POA: Diagnosis not present

## 2020-11-27 DIAGNOSIS — I70261 Atherosclerosis of native arteries of extremities with gangrene, right leg: Secondary | ICD-10-CM | POA: Diagnosis not present

## 2020-11-27 DIAGNOSIS — J45909 Unspecified asthma, uncomplicated: Secondary | ICD-10-CM | POA: Diagnosis present

## 2020-11-27 DIAGNOSIS — L89310 Pressure ulcer of right buttock, unstageable: Secondary | ICD-10-CM | POA: Diagnosis present

## 2020-11-27 DIAGNOSIS — I509 Heart failure, unspecified: Secondary | ICD-10-CM | POA: Diagnosis present

## 2020-11-27 DIAGNOSIS — Z515 Encounter for palliative care: Secondary | ICD-10-CM | POA: Diagnosis not present

## 2020-11-27 DIAGNOSIS — Z794 Long term (current) use of insulin: Secondary | ICD-10-CM

## 2020-11-27 DIAGNOSIS — Z992 Dependence on renal dialysis: Secondary | ICD-10-CM | POA: Diagnosis not present

## 2020-11-27 DIAGNOSIS — I132 Hypertensive heart and chronic kidney disease with heart failure and with stage 5 chronic kidney disease, or end stage renal disease: Secondary | ICD-10-CM | POA: Diagnosis present

## 2020-11-27 DIAGNOSIS — E875 Hyperkalemia: Secondary | ICD-10-CM | POA: Diagnosis present

## 2020-11-27 DIAGNOSIS — I953 Hypotension of hemodialysis: Secondary | ICD-10-CM | POA: Diagnosis not present

## 2020-11-27 DIAGNOSIS — E871 Hypo-osmolality and hyponatremia: Secondary | ICD-10-CM | POA: Diagnosis not present

## 2020-11-27 DIAGNOSIS — I10 Essential (primary) hypertension: Secondary | ICD-10-CM | POA: Diagnosis not present

## 2020-11-27 DIAGNOSIS — Z89612 Acquired absence of left leg above knee: Secondary | ICD-10-CM | POA: Diagnosis not present

## 2020-11-27 DIAGNOSIS — I4891 Unspecified atrial fibrillation: Secondary | ICD-10-CM | POA: Diagnosis not present

## 2020-11-27 DIAGNOSIS — I4892 Unspecified atrial flutter: Secondary | ICD-10-CM | POA: Diagnosis present

## 2020-11-27 DIAGNOSIS — R627 Adult failure to thrive: Secondary | ICD-10-CM | POA: Diagnosis present

## 2020-11-27 DIAGNOSIS — I120 Hypertensive chronic kidney disease with stage 5 chronic kidney disease or end stage renal disease: Secondary | ICD-10-CM | POA: Diagnosis not present

## 2020-11-27 DIAGNOSIS — I70201 Unspecified atherosclerosis of native arteries of extremities, right leg: Secondary | ICD-10-CM | POA: Diagnosis not present

## 2020-11-27 DIAGNOSIS — I110 Hypertensive heart disease with heart failure: Secondary | ICD-10-CM | POA: Diagnosis not present

## 2020-11-27 DIAGNOSIS — A419 Sepsis, unspecified organism: Secondary | ICD-10-CM | POA: Diagnosis present

## 2020-11-27 DIAGNOSIS — N2581 Secondary hyperparathyroidism of renal origin: Secondary | ICD-10-CM | POA: Diagnosis not present

## 2020-11-27 DIAGNOSIS — Z9115 Patient's noncompliance with renal dialysis: Secondary | ICD-10-CM

## 2020-11-27 DIAGNOSIS — E1122 Type 2 diabetes mellitus with diabetic chronic kidney disease: Secondary | ICD-10-CM

## 2020-11-27 DIAGNOSIS — D75839 Thrombocytosis, unspecified: Secondary | ICD-10-CM | POA: Diagnosis present

## 2020-11-27 DIAGNOSIS — E1151 Type 2 diabetes mellitus with diabetic peripheral angiopathy without gangrene: Secondary | ICD-10-CM | POA: Diagnosis not present

## 2020-11-27 DIAGNOSIS — Z833 Family history of diabetes mellitus: Secondary | ICD-10-CM

## 2020-11-27 DIAGNOSIS — D631 Anemia in chronic kidney disease: Secondary | ICD-10-CM | POA: Diagnosis present

## 2020-11-27 DIAGNOSIS — M533 Sacrococcygeal disorders, not elsewhere classified: Secondary | ICD-10-CM | POA: Diagnosis not present

## 2020-11-27 DIAGNOSIS — I70263 Atherosclerosis of native arteries of extremities with gangrene, bilateral legs: Secondary | ICD-10-CM | POA: Diagnosis present

## 2020-11-27 DIAGNOSIS — E1152 Type 2 diabetes mellitus with diabetic peripheral angiopathy with gangrene: Secondary | ICD-10-CM | POA: Diagnosis present

## 2020-11-27 DIAGNOSIS — I9589 Other hypotension: Secondary | ICD-10-CM | POA: Diagnosis not present

## 2020-11-27 DIAGNOSIS — L97129 Non-pressure chronic ulcer of left thigh with unspecified severity: Secondary | ICD-10-CM | POA: Diagnosis not present

## 2020-11-27 DIAGNOSIS — Z20822 Contact with and (suspected) exposure to covid-19: Secondary | ICD-10-CM | POA: Diagnosis present

## 2020-11-27 DIAGNOSIS — N186 End stage renal disease: Secondary | ICD-10-CM

## 2020-11-27 DIAGNOSIS — E46 Unspecified protein-calorie malnutrition: Secondary | ICD-10-CM | POA: Diagnosis present

## 2020-11-27 DIAGNOSIS — M79642 Pain in left hand: Secondary | ICD-10-CM | POA: Diagnosis not present

## 2020-11-27 DIAGNOSIS — Z79899 Other long term (current) drug therapy: Secondary | ICD-10-CM

## 2020-11-27 DIAGNOSIS — L89150 Pressure ulcer of sacral region, unstageable: Secondary | ICD-10-CM | POA: Diagnosis present

## 2020-11-27 DIAGNOSIS — T8781 Dehiscence of amputation stump: Secondary | ICD-10-CM | POA: Diagnosis not present

## 2020-11-27 DIAGNOSIS — J9811 Atelectasis: Secondary | ICD-10-CM | POA: Diagnosis present

## 2020-11-27 DIAGNOSIS — L89219 Pressure ulcer of right hip, unspecified stage: Secondary | ICD-10-CM | POA: Diagnosis present

## 2020-11-27 DIAGNOSIS — E1165 Type 2 diabetes mellitus with hyperglycemia: Secondary | ICD-10-CM | POA: Diagnosis present

## 2020-11-27 DIAGNOSIS — S31000D Unspecified open wound of lower back and pelvis without penetration into retroperitoneum, subsequent encounter: Secondary | ICD-10-CM | POA: Diagnosis not present

## 2020-11-27 DIAGNOSIS — E8779 Other fluid overload: Secondary | ICD-10-CM | POA: Diagnosis not present

## 2020-11-27 DIAGNOSIS — M898X9 Other specified disorders of bone, unspecified site: Secondary | ICD-10-CM | POA: Diagnosis present

## 2020-11-27 DIAGNOSIS — I499 Cardiac arrhythmia, unspecified: Secondary | ICD-10-CM | POA: Diagnosis not present

## 2020-11-27 DIAGNOSIS — Z66 Do not resuscitate: Secondary | ICD-10-CM | POA: Diagnosis present

## 2020-11-27 DIAGNOSIS — Z8249 Family history of ischemic heart disease and other diseases of the circulatory system: Secondary | ICD-10-CM

## 2020-11-27 DIAGNOSIS — R531 Weakness: Secondary | ICD-10-CM | POA: Diagnosis not present

## 2020-11-27 DIAGNOSIS — R52 Pain, unspecified: Secondary | ICD-10-CM | POA: Diagnosis not present

## 2020-11-27 DIAGNOSIS — Z89512 Acquired absence of left leg below knee: Secondary | ICD-10-CM | POA: Diagnosis not present

## 2020-11-27 DIAGNOSIS — Z89511 Acquired absence of right leg below knee: Secondary | ICD-10-CM

## 2020-11-27 DIAGNOSIS — Z89519 Acquired absence of unspecified leg below knee: Secondary | ICD-10-CM

## 2020-11-27 DIAGNOSIS — R0902 Hypoxemia: Secondary | ICD-10-CM | POA: Diagnosis not present

## 2020-11-27 DIAGNOSIS — L8990 Pressure ulcer of unspecified site, unspecified stage: Secondary | ICD-10-CM

## 2020-11-27 DIAGNOSIS — K6289 Other specified diseases of anus and rectum: Secondary | ICD-10-CM | POA: Diagnosis present

## 2020-11-27 DIAGNOSIS — T8789 Other complications of amputation stump: Secondary | ICD-10-CM | POA: Diagnosis not present

## 2020-11-27 DIAGNOSIS — S31000A Unspecified open wound of lower back and pelvis without penetration into retroperitoneum, initial encounter: Secondary | ICD-10-CM | POA: Diagnosis not present

## 2020-11-27 DIAGNOSIS — E119 Type 2 diabetes mellitus without complications: Secondary | ICD-10-CM | POA: Diagnosis not present

## 2020-11-27 DIAGNOSIS — I96 Gangrene, not elsewhere classified: Secondary | ICD-10-CM | POA: Diagnosis not present

## 2020-11-27 DIAGNOSIS — D72829 Elevated white blood cell count, unspecified: Secondary | ICD-10-CM | POA: Diagnosis not present

## 2020-11-27 DIAGNOSIS — N2889 Other specified disorders of kidney and ureter: Secondary | ICD-10-CM | POA: Diagnosis present

## 2020-11-27 DIAGNOSIS — Z4801 Encounter for change or removal of surgical wound dressing: Secondary | ICD-10-CM | POA: Diagnosis not present

## 2020-11-27 DIAGNOSIS — Z7189 Other specified counseling: Secondary | ICD-10-CM | POA: Diagnosis not present

## 2020-11-27 DIAGNOSIS — IMO0002 Reserved for concepts with insufficient information to code with codable children: Secondary | ICD-10-CM

## 2020-11-27 DIAGNOSIS — Z7982 Long term (current) use of aspirin: Secondary | ICD-10-CM

## 2020-11-27 DIAGNOSIS — Z7901 Long term (current) use of anticoagulants: Secondary | ICD-10-CM

## 2020-11-27 DIAGNOSIS — Z87891 Personal history of nicotine dependence: Secondary | ICD-10-CM

## 2020-11-27 DIAGNOSIS — Z4781 Encounter for orthopedic aftercare following surgical amputation: Secondary | ICD-10-CM | POA: Diagnosis not present

## 2020-11-27 DIAGNOSIS — L089 Local infection of the skin and subcutaneous tissue, unspecified: Secondary | ICD-10-CM | POA: Diagnosis not present

## 2020-11-27 DIAGNOSIS — Z89611 Acquired absence of right leg above knee: Secondary | ICD-10-CM | POA: Diagnosis not present

## 2020-11-27 DIAGNOSIS — Z820 Family history of epilepsy and other diseases of the nervous system: Secondary | ICD-10-CM

## 2020-11-27 LAB — CBC
HCT: 25.9 % — ABNORMAL LOW (ref 36.0–46.0)
Hemoglobin: 8 g/dL — ABNORMAL LOW (ref 12.0–15.0)
MCH: 23.7 pg — ABNORMAL LOW (ref 26.0–34.0)
MCHC: 30.9 g/dL (ref 30.0–36.0)
MCV: 76.9 fL — ABNORMAL LOW (ref 80.0–100.0)
Platelets: 735 10*3/uL — ABNORMAL HIGH (ref 150–400)
RBC: 3.37 MIL/uL — ABNORMAL LOW (ref 3.87–5.11)
RDW: 19.6 % — ABNORMAL HIGH (ref 11.5–15.5)
WBC: 24.9 10*3/uL — ABNORMAL HIGH (ref 4.0–10.5)
nRBC: 0.1 % (ref 0.0–0.2)

## 2020-11-27 LAB — CBC WITH DIFFERENTIAL/PLATELET
Abs Immature Granulocytes: 0.28 10*3/uL — ABNORMAL HIGH (ref 0.00–0.07)
Basophils Absolute: 0.1 10*3/uL (ref 0.0–0.1)
Basophils Relative: 1 %
Eosinophils Absolute: 0.5 10*3/uL (ref 0.0–0.5)
Eosinophils Relative: 2 %
HCT: 26.9 % — ABNORMAL LOW (ref 36.0–46.0)
Hemoglobin: 8.3 g/dL — ABNORMAL LOW (ref 12.0–15.0)
Immature Granulocytes: 1 %
Lymphocytes Relative: 5 %
Lymphs Abs: 1.1 10*3/uL (ref 0.7–4.0)
MCH: 23.6 pg — ABNORMAL LOW (ref 26.0–34.0)
MCHC: 30.9 g/dL (ref 30.0–36.0)
MCV: 76.4 fL — ABNORMAL LOW (ref 80.0–100.0)
Monocytes Absolute: 1.1 10*3/uL — ABNORMAL HIGH (ref 0.1–1.0)
Monocytes Relative: 5 %
Neutro Abs: 20.5 10*3/uL — ABNORMAL HIGH (ref 1.7–7.7)
Neutrophils Relative %: 86 %
Platelets: 737 10*3/uL — ABNORMAL HIGH (ref 150–400)
RBC: 3.52 MIL/uL — ABNORMAL LOW (ref 3.87–5.11)
RDW: 19.5 % — ABNORMAL HIGH (ref 11.5–15.5)
WBC: 23.6 10*3/uL — ABNORMAL HIGH (ref 4.0–10.5)
nRBC: 0 % (ref 0.0–0.2)

## 2020-11-27 LAB — RENAL FUNCTION PANEL
Albumin: 2.1 g/dL — ABNORMAL LOW (ref 3.5–5.0)
Anion gap: 16 — ABNORMAL HIGH (ref 5–15)
BUN: 83 mg/dL — ABNORMAL HIGH (ref 8–23)
CO2: 22 mmol/L (ref 22–32)
Calcium: 7.9 mg/dL — ABNORMAL LOW (ref 8.9–10.3)
Chloride: 92 mmol/L — ABNORMAL LOW (ref 98–111)
Creatinine, Ser: 13.93 mg/dL — ABNORMAL HIGH (ref 0.44–1.00)
GFR, Estimated: 3 mL/min — ABNORMAL LOW (ref >60)
Glucose, Bld: 124 mg/dL — ABNORMAL HIGH (ref 70–99)
Phosphorus: 9.7 mg/dL — ABNORMAL HIGH (ref 2.5–4.6)
Potassium: 5.9 mmol/L — ABNORMAL HIGH (ref 3.5–5.1)
Sodium: 130 mmol/L — ABNORMAL LOW (ref 135–145)

## 2020-11-27 LAB — COMPREHENSIVE METABOLIC PANEL
ALT: 10 U/L (ref 0–44)
AST: 12 U/L — ABNORMAL LOW (ref 15–41)
Albumin: 2.1 g/dL — ABNORMAL LOW (ref 3.5–5.0)
Alkaline Phosphatase: 79 U/L (ref 38–126)
Anion gap: 16 — ABNORMAL HIGH (ref 5–15)
BUN: 81 mg/dL — ABNORMAL HIGH (ref 8–23)
CO2: 22 mmol/L (ref 22–32)
Calcium: 8.1 mg/dL — ABNORMAL LOW (ref 8.9–10.3)
Chloride: 94 mmol/L — ABNORMAL LOW (ref 98–111)
Creatinine, Ser: 13.1 mg/dL — ABNORMAL HIGH (ref 0.44–1.00)
GFR, Estimated: 3 mL/min — ABNORMAL LOW (ref >60)
Glucose, Bld: 126 mg/dL — ABNORMAL HIGH (ref 70–99)
Potassium: 5.8 mmol/L — ABNORMAL HIGH (ref 3.5–5.1)
Sodium: 132 mmol/L — ABNORMAL LOW (ref 135–145)
Total Bilirubin: 1 mg/dL (ref 0.3–1.2)
Total Protein: 7.2 g/dL (ref 6.5–8.1)

## 2020-11-27 LAB — RESP PANEL BY RT-PCR (FLU A&B, COVID) ARPGX2
Influenza A by PCR: NEGATIVE
Influenza B by PCR: NEGATIVE
SARS Coronavirus 2 by RT PCR: NEGATIVE

## 2020-11-27 MED ORDER — SODIUM CHLORIDE 0.9% FLUSH
3.0000 mL | Freq: Two times a day (BID) | INTRAVENOUS | Status: DC
  Administered 2020-11-27 – 2020-12-17 (×34): 3 mL via INTRAVENOUS

## 2020-11-27 MED ORDER — ONDANSETRON HCL 4 MG/2ML IJ SOLN
4.0000 mg | Freq: Once | INTRAMUSCULAR | Status: AC
  Administered 2020-11-27: 4 mg via INTRAVENOUS
  Filled 2020-11-27: qty 2

## 2020-11-27 MED ORDER — GABAPENTIN 300 MG PO CAPS
300.0000 mg | ORAL_CAPSULE | Freq: Every day | ORAL | Status: DC
  Administered 2020-11-27 – 2020-12-16 (×15): 300 mg via ORAL
  Filled 2020-11-27 (×19): qty 1

## 2020-11-27 MED ORDER — PENTAFLUOROPROP-TETRAFLUOROETH EX AERO: 1.0000 "application " | INHALATION_SPRAY | CUTANEOUS | Status: DC | PRN

## 2020-11-27 MED ORDER — SODIUM CHLORIDE 0.9 % IV SOLN
250.0000 mL | INTRAVENOUS | Status: DC | PRN
  Administered 2020-12-03 – 2020-12-04 (×2): 250 mL via INTRAVENOUS

## 2020-11-27 MED ORDER — SODIUM CHLORIDE 0.9 % IV SOLN: 100.0000 mL | INTRAVENOUS | Status: DC | PRN

## 2020-11-27 MED ORDER — ONDANSETRON HCL 4 MG PO TABS: 4.0000 mg | ORAL_TABLET | Freq: Four times a day (QID) | ORAL | Status: DC | PRN

## 2020-11-27 MED ORDER — LIDOCAINE-PRILOCAINE 2.5-2.5 % EX CREA: 1.0000 "application " | TOPICAL_CREAM | CUTANEOUS | Status: DC | PRN

## 2020-11-27 MED ORDER — FENTANYL CITRATE (PF) 100 MCG/2ML IJ SOLN
50.0000 ug | Freq: Once | INTRAMUSCULAR | Status: AC
Start: 2020-11-27 — End: 2020-11-27
  Administered 2020-11-27: 50 ug via INTRAVENOUS
  Filled 2020-11-27: qty 2

## 2020-11-27 MED ORDER — POLYETHYLENE GLYCOL 3350 17 G PO PACK
17.0000 g | PACK | Freq: Every day | ORAL | Status: DC
  Administered 2020-11-27 – 2020-12-16 (×14): 17 g via ORAL
  Filled 2020-11-27 (×18): qty 1

## 2020-11-27 MED ORDER — LABETALOL HCL 5 MG/ML IV SOLN
10.0000 mg | INTRAVENOUS | Status: DC | PRN
  Filled 2020-11-27: qty 4

## 2020-11-27 MED ORDER — SENNOSIDES-DOCUSATE SODIUM 8.6-50 MG PO TABS
1.0000 | ORAL_TABLET | Freq: Two times a day (BID) | ORAL | Status: DC
  Administered 2020-11-27 – 2020-12-16 (×28): 1 via ORAL
  Filled 2020-11-27 (×36): qty 1

## 2020-11-27 MED ORDER — ASPIRIN EC 81 MG PO TBEC
81.0000 mg | DELAYED_RELEASE_TABLET | Freq: Every day | ORAL | Status: DC
  Administered 2020-11-27 – 2020-12-16 (×16): 81 mg via ORAL
  Filled 2020-11-27 (×20): qty 1

## 2020-11-27 MED ORDER — HEPARIN SODIUM (PORCINE) 1000 UNIT/ML DIALYSIS: 20.0000 [IU]/kg | INTRAMUSCULAR | Status: DC | PRN

## 2020-11-27 MED ORDER — INSULIN DETEMIR 100 UNIT/ML ~~LOC~~ SOLN
16.0000 [IU] | Freq: Every day | SUBCUTANEOUS | Status: DC
  Administered 2020-11-28 – 2020-12-10 (×13): 16 [IU] via SUBCUTANEOUS
  Filled 2020-11-27 (×16): qty 0.16

## 2020-11-27 MED ORDER — OXYCODONE HCL 5 MG PO TABS
5.0000 mg | ORAL_TABLET | Freq: Four times a day (QID) | ORAL | Status: DC | PRN
  Administered 2020-11-29 – 2020-12-09 (×9): 5 mg via ORAL
  Filled 2020-11-27 (×10): qty 1

## 2020-11-27 MED ORDER — ACETAMINOPHEN 650 MG RE SUPP: 650.0000 mg | Freq: Four times a day (QID) | RECTAL | Status: DC | PRN

## 2020-11-27 MED ORDER — SODIUM CHLORIDE 0.9% FLUSH: 3.0000 mL | INTRAVENOUS | Status: DC | PRN

## 2020-11-27 MED ORDER — HYDROMORPHONE HCL 1 MG/ML IJ SOLN
0.5000 mg | INTRAMUSCULAR | Status: DC | PRN
  Administered 2020-11-27 – 2020-12-03 (×8): 1 mg via INTRAVENOUS
  Administered 2020-12-03: 0.5 mg via INTRAVENOUS
  Administered 2020-12-04 – 2020-12-06 (×3): 1 mg via INTRAVENOUS
  Administered 2020-12-08: 0.5 mg via INTRAVENOUS
  Filled 2020-11-27 (×14): qty 1

## 2020-11-27 MED ORDER — SEVELAMER CARBONATE 800 MG PO TABS
2400.0000 mg | ORAL_TABLET | Freq: Three times a day (TID) | ORAL | Status: DC
  Administered 2020-11-28 – 2020-12-16 (×34): 2400 mg via ORAL
  Filled 2020-11-27 (×30): qty 3

## 2020-11-27 MED ORDER — CHLORHEXIDINE GLUCONATE CLOTH 2 % EX PADS
6.0000 | MEDICATED_PAD | Freq: Every day | CUTANEOUS | Status: DC
  Administered 2020-12-02 – 2020-12-07 (×6): 6 via TOPICAL

## 2020-11-27 MED ORDER — DIPHENHYDRAMINE HCL 25 MG PO CAPS: 25.0000 mg | ORAL_CAPSULE | Freq: Four times a day (QID) | ORAL | Status: DC | PRN

## 2020-11-27 MED ORDER — ATORVASTATIN CALCIUM 40 MG PO TABS
40.0000 mg | ORAL_TABLET | Freq: Every evening | ORAL | Status: DC
  Administered 2020-11-29 – 2020-12-16 (×18): 40 mg via ORAL
  Filled 2020-11-27 (×20): qty 1

## 2020-11-27 MED ORDER — ALTEPLASE 2 MG IJ SOLR: 2.0000 mg | Freq: Once | INTRAMUSCULAR | Status: DC | PRN

## 2020-11-27 MED ORDER — AMIODARONE HCL 200 MG PO TABS
200.0000 mg | ORAL_TABLET | Freq: Every day | ORAL | Status: DC
  Administered 2020-11-27 – 2020-12-16 (×20): 200 mg via ORAL
  Filled 2020-11-27 (×20): qty 1

## 2020-11-27 MED ORDER — ACETAMINOPHEN 325 MG PO TABS: 325.0000 mg | ORAL_TABLET | ORAL | Status: DC | PRN

## 2020-11-27 MED ORDER — HEPARIN SODIUM (PORCINE) 1000 UNIT/ML DIALYSIS: 1000.0000 [IU] | INTRAMUSCULAR | Status: DC | PRN

## 2020-11-27 MED ORDER — SODIUM ZIRCONIUM CYCLOSILICATE 5 G PO PACK
10.0000 g | PACK | Freq: Once | ORAL | Status: AC
  Administered 2020-11-27: 10 g via ORAL
  Filled 2020-11-27: qty 2

## 2020-11-27 MED ORDER — HYDROCORTISONE ACETATE 25 MG RE SUPP
25.0000 mg | Freq: Two times a day (BID) | RECTAL | Status: DC
  Administered 2020-11-27 – 2020-12-11 (×18): 25 mg via RECTAL
  Filled 2020-11-27 (×26): qty 1

## 2020-11-27 MED ORDER — MIDODRINE HCL 5 MG PO TABS
5.0000 mg | ORAL_TABLET | Freq: Three times a day (TID) | ORAL | Status: DC
  Administered 2020-11-27 – 2020-12-09 (×32): 5 mg via ORAL
  Filled 2020-11-27 (×33): qty 1

## 2020-11-27 MED ORDER — ACETAMINOPHEN 325 MG PO TABS
650.0000 mg | ORAL_TABLET | Freq: Four times a day (QID) | ORAL | Status: DC | PRN
  Administered 2020-12-13 – 2020-12-15 (×3): 650 mg via ORAL
  Filled 2020-11-27 (×2): qty 2

## 2020-11-27 MED ORDER — ONDANSETRON HCL 4 MG/2ML IJ SOLN
4.0000 mg | Freq: Once | INTRAMUSCULAR | Status: DC
Start: 2020-11-27 — End: 2020-12-07
  Filled 2020-11-27 (×2): qty 2

## 2020-11-27 MED ORDER — ENOXAPARIN SODIUM 80 MG/0.8ML IJ SOSY
80.0000 mg | PREFILLED_SYRINGE | INTRAMUSCULAR | Status: DC
  Administered 2020-11-27 – 2020-11-29 (×3): 80 mg via SUBCUTANEOUS
  Filled 2020-11-27 (×3): qty 0.8

## 2020-11-27 MED ORDER — ONDANSETRON HCL 4 MG/2ML IJ SOLN: 4.0000 mg | Freq: Four times a day (QID) | INTRAMUSCULAR | Status: DC | PRN

## 2020-11-27 MED ORDER — LIDOCAINE HCL (PF) 1 % IJ SOLN: 5.0000 mL | INTRAMUSCULAR | Status: DC | PRN

## 2020-11-27 MED ORDER — FENTANYL CITRATE (PF) 100 MCG/2ML IJ SOLN
50.0000 ug | Freq: Once | INTRAMUSCULAR | Status: AC
  Administered 2020-11-27: 50 ug via INTRAVENOUS
  Filled 2020-11-27: qty 2

## 2020-11-27 NOTE — ED Triage Notes (Signed)
Pt arrives via [facility] from dialysis center for c/o severe pain due to multiple bed sores on sacrum. She also has necrosis to left thumb and open lesions to left hand - reports IV infiltration during previous hospital stay that caused clots to form. She states she has appt for this tomorrow. Last time dialyzed was last Wednesday.

## 2020-11-27 NOTE — Progress Notes (Signed)
Notified by Dr. Manuella Ghazi that the patient has missed her last 2 HD sessions due to painful sacral decubitus ulcers and is being admitted with gangrenous left hand.  She will be transferred to [facility] to be evaluated by vascular surgery but due to hyperkalemia and bed availability, will plan for HD today prior to transfer to [facility].   She was discharged from the hospital 2 weeks ago with septic shock due to PNA and was followed by our service at that time.

## 2020-11-27 NOTE — Procedures (Signed)
   HEMODIALYSIS TREATMENT NOTE:   HD performed prior to transfer to [facility] for vascular evaluation.  Abbreviated 3 hour session (high ESRD pt census) completed via right upper arm AVG (15g/antegrade) with first hour on 1K bath.  Goal met: 2 liters removed without interruption in UF.  Hemodynamically stable.  All blood was returned and hemostasis was achieved in 15 minutes.    Infection/Serology Draw Date Description Result/Unit Interpretation Ref Range  20 Nov 2020  Hepatitis B virus surface Ag:PrThr:Pt:Ser/Plas:Ord:IA:  NEG        Rockwell Alexandria, RN

## 2020-11-27 NOTE — H&P (Addendum)
History and Physical    Christy Zhang [id number] DOB: 1955/06/26 DOA: 12/04/2020  PCP: Claretta Fraise, MD   Patient coming from: Hemodialysis center   Chief Complaint: Painful sacral ulcers  HPI: Christy Zhang is a 65 y.o. female with medical history significant for ESRD on HD MWF, atrial fibrillation/flutter on Eliquis, obesity, type 2 diabetes, hypertension, dyslipidemia, peripheral vascular disease, bilateral BKA, and chronic anemia who was brought to the ED since it was too painful for her to sit down on her decubitus ulcers to receive hemodialysis today.  She missed her hemodialysis last Friday as well because of weakness and ended up missing today due to her pain.  She was hospitalized early last month for pneumonia at which time she developed necrosis to her left hand with left radial artery thrombotic occlusion and noted gangrenous left fingertips.   ED Course: Vital signs demonstrate elevated blood pressures, but she is currently in sinus rhythm.  Potassium is 5.8, sodium 132, hemoglobin 8.3, creatinine 13.10 and BUN 81.  Chest x-ray with some atelectasis noted.  Multiple images of necrotic lesions as noted below.  EDP had discussed with vascular Dr. Oneida Alar who agrees to further evaluate upon transfer to see if vascular intervention would be beneficial versus possible need for amputation.  Review of Systems: Reviewed as noted above, otherwise negative.  Past Medical History:  Diagnosis Date   Anemia    Arthritis    Asthma    in the past   CHF (congestive heart failure) ([place])    Diabetes mellitus with complication ([place])    Type 2   End stage renal disease on dialysis ([place])    Gangrene ([place])    Hyperlipidemia    Hypertension     Past Surgical History:  Procedure Laterality Date    LEFT BELOW KNEE AMPUTATION (Left Knee)  06/08/2020   A/V FISTULAGRAM Right 02/04/2017   Procedure: A/V Fistulagram;  Surgeon: Waynetta Sandy, MD;  Location: [place] [facility];   Service: Cardiovascular;  Laterality: Right;   ABDOMINAL AORTOGRAM W/LOWER EXTREMITY N/A 11/18/2019   Procedure: ABDOMINAL AORTOGRAM W/LOWER EXTREMITY;  Surgeon: [name], MD;  Location: [place] [facility];  Service: Cardiovascular;  Laterality: N/A;   ABDOMINAL AORTOGRAM W/LOWER EXTREMITY N/A 02/23/2020   Procedure: ABDOMINAL AORTOGRAM W/LOWER EXTREMITY;  Surgeon: [name], MD;  Location: [place] [facility];  Service: Cardiovascular;  Laterality: N/A;  Bilateral   ABDOMINAL AORTOGRAM W/LOWER EXTREMITY N/A 08/10/2020   Procedure: ABDOMINAL AORTOGRAM W/LOWER EXTREMITY;  Surgeon: [name], MD;  Location: [place] [facility];  Service: Cardiovascular;  Laterality: N/A;   AMPUTATION Left 06/08/2020   Procedure: LEFT BELOW KNEE AMPUTATION;  Surgeon: Cherre Robins, MD;  Location: [place];  Service: Vascular;  Laterality: Left;   AMPUTATION Right 09/14/2020   Procedure: RIGHT BELOW KNEE AMPUTATION;  Surgeon: Waynetta Sandy, MD;  Location: [place];  Service: Vascular;  Laterality: Right;   AV FISTULA PLACEMENT Right 08/25/2014   Procedure: RIGHT BRACHIOCEPHALIC ARTERIOVENOUS (AV) FISTULA CREATION;  Surgeon: Mal Misty, MD;  Location: [place];  Service: Vascular;  Laterality: Right;   AV FISTULA PLACEMENT Right 03/18/2019   Procedure: INSERTION OF ARTERIOVENOUS (AV) GORE-TEX GRAFT RIGHT ARM;  Surgeon: Serafina Mitchell, MD;  Location: [facility];  Service: Vascular;  Laterality: Right;   [place] Right 12/24/2018   Procedure: FIRST STAGE BASILIC VEIN TRANSPOSITION RIGHT UPPER ARM;  Surgeon: Serafina Mitchell, MD;  Location: [place];  Service:  Vascular;  Laterality: Right;   FISTULA SUPERFICIALIZATION Right 11/03/2014   Procedure: RIGHT UPPER ARM FISTULA SUPERFICIALIZATION;  Surgeon: Mal Misty, MD;  Location: [place];  Service: Vascular;  Laterality: Right;   FISTULOGRAM Right 01/16/2016   Procedure: RIGHT UPPER EXTREMITY ARTERIOVENOUS FISTULOGRAM WITH  BALLOON ANGIOPLASTY;  Surgeon: Vickie Epley, MD;  Location: [facility];  Service: Vascular;  Laterality: Right;   INSERTION OF DIALYSIS CATHETER     X4   IR FLUORO GUIDE CV LINE RIGHT  12/03/2018   IR REMOVAL TUN CV CATH W/O FL  07/06/2019   IR THROMBECTOMY AV FISTULA W/THROMBOLYSIS/PTA INC/SHUNT/IMG RIGHT Right 12/03/2018   IR THROMBECTOMY AV FISTULA W/THROMBOLYSIS/PTA/STENT INC/SHUNT/IMG RT Right 10/11/2019   IR US GUIDE VASC ACCESS RIGHT  12/03/2018   IR US GUIDE VASC ACCESS RIGHT  12/03/2018   IR US GUIDE VASC ACCESS RIGHT  10/14/2019   LIGATION OF COMPETING BRANCHES OF ARTERIOVENOUS FISTULA Right 11/03/2014   Procedure: LIGATION OF COMPETING BRANCHE x1 OF RIGHT UPPER ARM ARTERIOVENOUS FISTULA;  Surgeon: Mal Misty, MD;  Location: [place];  Service: Vascular;  Laterality: Right;   PERIPHERAL VASCULAR BALLOON ANGIOPLASTY Right 02/04/2017   Procedure: PERIPHERAL VASCULAR BALLOON ANGIOPLASTY;  Surgeon: Waynetta Sandy, MD;  Location: [place] [facility];  Service: Cardiovascular;  Laterality: Right;   PERIPHERAL VASCULAR BALLOON ANGIOPLASTY Right 11/18/2019   Procedure: PERIPHERAL VASCULAR BALLOON ANGIOPLASTY;  Surgeon: [name], MD;  Location: [place] [facility];  Service: Cardiovascular;  Laterality: Right;  anterior tibial   PERIPHERAL VASCULAR BALLOON ANGIOPLASTY  02/23/2020   Procedure: PERIPHERAL VASCULAR BALLOON ANGIOPLASTY;  Surgeon: [name], MD;  Location: [place] [facility];  Service: Cardiovascular;;  Lt  AT   PERIPHERAL VASCULAR INTERVENTION Right 08/10/2020   Procedure: PERIPHERAL VASCULAR INTERVENTION;  Surgeon: [name], MD;  Location: [place] [facility];  Service: Cardiovascular;  Laterality: Right;   THROMBECTOMY BRACHIAL ARTERY Left 11/04/2020   Procedure: THROMBECTOMY RADIAL ARTERY with Bovine Patching.;  Surgeon: Cherre Robins, MD;  Location: [place];  Service: Vascular;  Laterality: Left;   TRANSMETATARSAL AMPUTATION Left 02/24/2020    Procedure: TRANSMETATARSAL AMPUTATION LEFT;  Surgeon: Cherre Robins, MD;  Location: [place];  Service: Vascular;  Laterality: Left;   TRANSMETATARSAL AMPUTATION Right 08/11/2020   Procedure: RIGHT TRANSMETATARSAL AMPUTATION;  Surgeon: Waynetta Sandy, MD;  Location: [place];  Service: Vascular;  Laterality: Right;   TUBAL LIGATION       reports that she quit smoking about 36 years ago. Her smoking use included cigarettes. She has never used smokeless tobacco. She reports that she does not drink alcohol and does not use drugs.  Allergies  Allergen Reactions   Sulfa Antibiotics Swelling    Family History  Problem Relation Age of Onset   Diabetes Mother    Hypertension Mother    Cancer Father    Diabetes Father    Hypertension Sister    Diabetes Daughter    Heart disease Daughter    Hypertension Daughter    Diabetes Son    Heart disease Son    Hypertension Son    Peripheral vascular disease Son        amputation    Prior to Admission medications   Medication Sig Start Date End Date Taking? Authorizing Provider  acetaminophen (TYLENOL) 325 MG tablet Take 1-2 tablets (325-650 mg total) by mouth every 4 (four) hours as needed for mild pain. 07/04/20   Bary Leriche, PA-C  amiodarone (  PACERONE) 200 MG tablet Take 1 tablet (200 mg total) by mouth daily for 19 days. 11/15/20 12/04/20  Shelly Coss, MD  apixaban (ELIQUIS) 5 MG TABS tablet Take 1 tablet (5 mg total) by mouth 2 (two) times daily. 11/14/20   Shelly Coss, MD  aspirin EC 81 MG tablet Take 81 mg by mouth at bedtime. Swallow whole.    [provider]  atorvastatin (LIPITOR) 40 MG tablet TAKE ONE TABLET BY MOUTH DAILY 08/21/20   Claretta Fraise, MD  diphenhydrAMINE (BENADRYL) 25 MG tablet Take 25 mg by mouth every 6 (six) hours as needed for itching.    [provider]  gabapentin (NEURONTIN) 300 MG capsule Take 1 capsule (300 mg total) by mouth at bedtime. 07/27/20   Bayard Hugger, NP  insulin  detemir (LEVEMIR) 100 UNIT/ML injection Inject 0.16 mLs (16 Units total) into the skin daily. 08/31/20   Claretta Fraise, MD  Insulin Syringe-Needle U-100 28G X 1/2" 1 ML MISC Use with insulin daily Dx E11.22 08/31/20   Claretta Fraise, MD  midodrine (PROAMATINE) 5 MG tablet Take 1 tablet (5 mg total) by mouth 3 (three) times daily with meals. 11/14/20   Shelly Coss, MD  Misc. Devices (EGG CRATE BED PAD) MISC Use to treat and prevent bed sores 11/20/20   Claretta Fraise, MD  oxyCODONE (OXY IR/ROXICODONE) 5 MG immediate release tablet Take 1 tablet (5 mg total) by mouth every 6 (six) hours as needed for severe pain. 09/18/20   Elgergawy, Silver Huguenin, MD  polyethylene glycol (MIRALAX / GLYCOLAX) 17 g packet Take 17 g by mouth daily. 06/17/20   Regalado, Belkys A, MD  senna-docusate (SENOKOT-S) 8.6-50 MG tablet Take 1 tablet by mouth 2 (two) times daily. 07/04/20   Love, Ivan Anchors, PA-C  sevelamer carbonate (RENVELA) 800 MG tablet Take 1,600-2,400 mg by mouth See admin instructions. Take 2400 mg with each meal and 1600 mg with each snack    [provider]  TRULICITY 1.5 0000000 SOPN INJECT CONTENTs OF ONE PEN UNDER THE SKIN WEEKLY ON SATURDAY 08/24/20   Claretta Fraise, MD    Physical Exam: Vitals:   11/28/2020 1008 12/01/2020 1100 12/04/2020 1300 12/04/2020 1322  BP:   (!) 145/134 (!) 134/118  Pulse:   88 83  Resp:   (!) 25 (!) 22  Temp: 99.2 F (37.3 C) 98.3 F (36.8 C)    TempSrc: Oral Rectal    SpO2:   94% 92%  Weight:      Height:        Constitutional: NAD, calm, comfortable Vitals:   11/28/2020 1008 12/07/2020 1100 11/29/2020 1300 12/15/2020 1322  BP:   (!) 145/134 (!) 134/118  Pulse:   88 83  Resp:   (!) 25 (!) 22  Temp: 99.2 F (37.3 C) 98.3 F (36.8 C)    TempSrc: Oral Rectal    SpO2:   94% 92%  Weight:      Height:       Eyes: lids and conjunctivae normal Neck: normal, supple Respiratory: clear to auscultation bilaterally. Normal respiratory effort. No accessory muscle use.   Cardiovascular: Regular rate and rhythm, no murmurs. Abdomen: no tenderness, no distention. Bowel sounds positive.  Musculoskeletal: Bilateral BKA with gangrenous changes, left hand gangrene     Skin: no rashes, lesions, ulcers.    Psychiatric: Flat affect  Labs on Admission: I have personally reviewed following labs and imaging studies  CBC: Recent Labs  Lab 11/23/20 2221 12/22/2020 1021  WBC 22.8* 23.6*  NEUTROABS 18.6* 20.5*  HGB 7.9* 8.3*  HCT 25.2* 26.9*  MCV 78.5* 76.4*  PLT 770* 123XX123*   Basic Metabolic Panel: Recent Labs  Lab 11/23/20 2221 12/14/2020 1021  NA 134* 132*  K 4.0 5.8*  CL 96* 94*  CO2 26 22  GLUCOSE 100* 126*  BUN 51* 81*  CREATININE 9.28* 13.10*  CALCIUM 8.3* 8.1*   GFR: Estimated Creatinine Clearance: 4.7 mL/min (A) (by C-G formula based on SCr of 13.1 mg/dL (H)). Liver Function Tests: Recent Labs  Lab 11/23/20 2221 12/10/2020 1021  AST 13* 12*  ALT 10 10  ALKPHOS 68 79  BILITOT 0.7 1.0  PROT 6.8 7.2  ALBUMIN 2.0* 2.1*   Recent Labs  Lab 11/23/20 2221  LIPASE 20   No results for input(s): AMMONIA in the last 168 hours. Coagulation Profile: Recent Labs  Lab 11/23/20 2221  INR 2.7*   Cardiac Enzymes: No results for input(s): CKTOTAL, CKMB, CKMBINDEX, TROPONINI in the last 168 hours. BNP (last 3 results) No results for input(s): PROBNP in the last 8760 hours. HbA1C: No results for input(s): HGBA1C in the last 72 hours. CBG: No results for input(s): GLUCAP in the last 168 hours. Lipid Profile: No results for input(s): CHOL, HDL, LDLCALC, TRIG, CHOLHDL, LDLDIRECT in the last 72 hours. Thyroid Function Tests: No results for input(s): TSH, T4TOTAL, FREET4, T3FREE, THYROIDAB in the last 72 hours. Anemia Panel: No results for input(s): VITAMINB12, FOLATE, FERRITIN, TIBC, IRON, RETICCTPCT in the last 72 hours. Urine analysis:    Component Value Date/Time   COLORURINE AMBER (A) 10/28/2020 1322   APPEARANCEUR TURBID (A)  10/28/2020 1322   LABSPEC 1.014 10/28/2020 1322   PHURINE 5.0 10/28/2020 1322   GLUCOSEU NEGATIVE 10/28/2020 1322   HGBUR MODERATE (A) 10/28/2020 1322   BILIRUBINUR NEGATIVE 10/28/2020 1322   KETONESUR NEGATIVE 10/28/2020 1322   PROTEINUR 100 (A) 10/28/2020 1322   NITRITE NEGATIVE 10/28/2020 1322   LEUKOCYTESUR LARGE (A) 10/28/2020 1322    Radiological Exams on Admission: DG Chest Portable 1 View  Result Date: 12/14/2020 CLINICAL DATA:  Generalized weakness. EXAM: PORTABLE CHEST 1 VIEW COMPARISON:  Multiple priors, most recent chest x-ray dated November 23, 2020 3: FINDINGS: Cardiac and mediastinal contours are unchanged. Mild bibasilar opacities, likely due to atelectasis. No focal consolidation. No large pleural effusion or evidence of pneumothorax. Vascular stent projecting over the right axilla. Left IJ line, ET tube and enteric tube have been removed when compared with prior x-ray. IMPRESSION: Mild bibasilar opacities, likely due to atelectasis. Electronically Signed   By: Yetta Glassman MD   On: 12/21/2020 11:11    EKG: Independently reviewed.  Sinus rhythm 84 bpm  Assessment/Plan Active Problems:   ESRD (end stage renal disease) on dialysis (HCC)    ESRD on HD with missed hemodialysis sessions -Currently with hyperkalemia and hypertension -Discussed with nephrology who will arrange hemodialysis today  Hyperkalemia -Due to missed dialysis sessions -Plan to give [facility] -HD today -Monitor closely  Left hand gangrene -Appears to be worsening from recent discharge on 7/19 -Noted to have thrombectomy for distal ischemia at that time -Appreciate vascular surgery follow-up and will transfer to [facility], Dr. 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 -Plan for MRI to further evaluate, but has to be done without contrast given her ESRD -Palliative consultation  to discuss goals of care  Sacral decubitus wounds -Wound care with wound  consultation  Chronic anemia -Related to ESRD -Currently stable  Atrial fibrillation/flutter -Currently in sinus rhythm -On oral amiodarone -Eliquis for anticoagulation held in case procedure needed -Full dose Lovenox  Dyslipidemia -Continue statin  Type 2 diabetes -SSI  Rectal pain with prior hemorrhoids -Minimal bleeding noted -Anusol suppositories ordered  Obesity -Lifestyle changes outpatient   DVT prophylaxis: Eliquis held, on full dose Lovenox for now Code Status: Partial, DNR not DNI Family Communication: Tried calling daughter with no response Disposition Plan:Admit for HD and vascular evaluation at [place] called:Nephrology, Vascular Surgery, Palliative Care Admission status: Inpatient, Tele   Fujiko Picazo D Zayli Villafuerte DO [facility]  If 7PM-7AM, please contact night-coverage [email or web address]  12/14/2020, 1:54 PM

## 2020-11-27 NOTE — Progress Notes (Signed)
Received report from [facility]. Room ready for patient.

## 2020-11-27 NOTE — ED Provider Notes (Signed)
[name] EMERGENCY DEPARTMENT Provider Note   CSN: [id number] Arrival date & time: 12/13/2020  0934     History Chief Complaint  Patient presents with  . Skin Ulcer    Christy Zhang is a 65 y.o. female.  HPI   Patient presents with pain from sacral ulcers.  Her last dialysis was on Wednesday, she missed Friday because of weakness.  She missed today because it was too painful to sit down on her decubitus ulcers. She went to the dialysis center today and they sent her to the ED for further evaluation.    She was  hospitalized early July for pneumonia, during that time she developed necrosis to the left hand.  Denies changes to the hand as well as pain to the hand at this time.  Past Medical History:  Diagnosis Date  . Anemia   . Arthritis   . Asthma    in the past  . CHF (congestive heart failure) ([place])   . 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 End stage renal disease on dialysis ([place])   . Gangrene ([place])   . Hyperlipidemia   . Hypertension     Patient Active Problem List   Diagnosis Date Noted  . Paroxysmal atrial fibrillation (HCC)   . Cardiac arrest ([place])   . CAP (community acquired pneumonia) 10/27/2020  . Altered mental status 10/27/2020  . Acute respiratory failure with hypoxemia ([place]) 10/27/2020  . Lactic acidosis 10/27/2020  . Hypoalbuminemia 10/27/2020  . Hyponatremia 10/27/2020  . Hyperlipidemia 10/27/2020  . S/P BKA (below knee amputation) unilateral, right ([place]) 09/14/2020  . Diabetic foot infection ([place])   . Medication monitoring encounter   . Pressure injury of skin 08/11/2020  . Thrombocytosis 08/09/2020  . Osteomyelitis of right foot ([place]) 08/08/2020  . Leucocytosis 07/05/2020  . Below-knee amputation of left lower extremity ([place]) 06/16/2020  . S/P BKA (below knee amputation) unilateral ([place]) 06/08/2020  . Ischemic ulcer of toe of right foot ([place]) 04/13/2020  . PVD (peripheral vascular disease)/ S/p BKA 04/13/2020  . ESRD on  hemodialysis ([place]) 01/27/2020  . Abdominal wall cellulitis   . Panniculitis 12/29/2019  . Osteomyelitis ([place]) 10/15/2019  . Hyperglycemia due to diabetes mellitus ([place]) 10/15/2019  . Osteomyelitis of third toe of right foot ([place]) 10/14/2019  . ESRD (end stage renal disease) ([place])   . Asthma, mild persistent 07/25/2015  . Hypertension secondary to other renal disorders 07/25/2015  . Controlled type 2 diabetes mellitus with complication, with long-term current use of insulin ([place]) 03/30/2015  . Anemia of chronic disease 02/17/2015  . 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 Essential (primary) hypertension 06/24/2014  . Heart failure ([place]) 06/24/2014  . Obesity, Class III, BMI 40-49.9 (morbid obesity) ([place]) 06/24/2014    Past Surgical History:  Procedure Laterality Date  .  LEFT BELOW KNEE AMPUTATION (Left Knee)  06/08/2020  . A/V FISTULAGRAM Right 02/04/2017   Procedure: A/V Fistulagram;  Surgeon: Waynetta Sandy, MD;  Location: [place] [facility];  Service: Cardiovascular;  Laterality: Right;  . ABDOMINAL AORTOGRAM W/LOWER EXTREMITY N/A 11/18/2019   Procedure: ABDOMINAL AORTOGRAM W/LOWER EXTREMITY;  Surgeon: [name], MD;  Location: [place] [facility];  Service: Cardiovascular;  Laterality: N/A;  . ABDOMINAL AORTOGRAM W/LOWER EXTREMITY N/A 02/23/2020   Procedure: ABDOMINAL AORTOGRAM W/LOWER EXTREMITY;  Surgeon: [name], MD;  Location: [place] [facility];  Service: Cardiovascular;  Laterality: N/A;  Bilateral  . ABDOMINAL  AORTOGRAM W/LOWER EXTREMITY N/A 08/10/2020   Procedure: ABDOMINAL AORTOGRAM W/LOWER EXTREMITY;  Surgeon: [name], MD;  Location: [place] [facility];  Service: Cardiovascular;  Laterality: N/A;  . AMPUTATION Left 06/08/2020   Procedure: LEFT BELOW KNEE AMPUTATION;  Surgeon: Cherre Robins, MD;  Location: [place];  Service: Vascular;  Laterality: Left;  . AMPUTATION Right 09/14/2020    Procedure: RIGHT BELOW KNEE AMPUTATION;  Surgeon: Waynetta Sandy, MD;  Location: [place];  Service: Vascular;  Laterality: Right;  . AV FISTULA PLACEMENT Right 08/25/2014   Procedure: RIGHT BRACHIOCEPHALIC ARTERIOVENOUS (AV) FISTULA CREATION;  Surgeon: Mal Misty, MD;  Location: [facility];  Service: Vascular;  Laterality: Right;  . AV FISTULA PLACEMENT Right 03/18/2019   Procedure: INSERTION OF ARTERIOVENOUS (AV) GORE-TEX GRAFT RIGHT ARM;  Surgeon: Serafina Mitchell, MD;  Location: [place];  Service: Vascular;  Laterality: Right;  . BASCILIC VEIN TRANSPOSITION Right 12/24/2018   Procedure: FIRST STAGE BASILIC VEIN TRANSPOSITION RIGHT UPPER ARM;  Surgeon: Serafina Mitchell, MD;  Location: [place];  Service: Vascular;  Laterality: Right;  . FISTULA SUPERFICIALIZATION Right 11/03/2014   Procedure: RIGHT UPPER ARM FISTULA SUPERFICIALIZATION;  Surgeon: Mal Misty, MD;  Location: [place];  Service: Vascular;  Laterality: Right;  . FISTULOGRAM Right 01/16/2016   Procedure: RIGHT UPPER EXTREMITY ARTERIOVENOUS FISTULOGRAM WITH BALLOON ANGIOPLASTY;  Surgeon: Vickie Epley, MD;  Location: [facility];  Service: Vascular;  Laterality: Right;  . INSERTION OF DIALYSIS CATHETER     X4  . IR FLUORO GUIDE CV LINE RIGHT  12/03/2018  . IR REMOVAL TUN CV CATH W/O FL  07/06/2019  . IR THROMBECTOMY AV FISTULA W/THROMBOLYSIS/PTA INC/SHUNT/IMG RIGHT Right 12/03/2018  . IR THROMBECTOMY AV FISTULA W/THROMBOLYSIS/PTA/STENT INC/SHUNT/IMG RT Right 10/11/2019  . IR US GUIDE VASC ACCESS RIGHT  12/03/2018  . IR US GUIDE VASC ACCESS RIGHT  12/03/2018  . IR US GUIDE VASC ACCESS RIGHT  10/14/2019  . LIGATION OF COMPETING BRANCHES OF ARTERIOVENOUS FISTULA Right 11/03/2014   Procedure: LIGATION OF COMPETING BRANCHE x1 OF RIGHT UPPER ARM ARTERIOVENOUS FISTULA;  Surgeon: Mal Misty, MD;  Location: [place];  Service: Vascular;  Laterality: Right;  . PERIPHERAL VASCULAR BALLOON ANGIOPLASTY Right 02/04/2017   Procedure: PERIPHERAL VASCULAR BALLOON  ANGIOPLASTY;  Surgeon: Waynetta Sandy, MD;  Location: [place] [facility];  Service: Cardiovascular;  Laterality: Right;  . PERIPHERAL VASCULAR BALLOON ANGIOPLASTY Right 11/18/2019   Procedure: PERIPHERAL VASCULAR BALLOON ANGIOPLASTY;  Surgeon: [name], MD;  Location: [place] [facility];  Service: Cardiovascular;  Laterality: Right;  anterior tibial  . PERIPHERAL VASCULAR BALLOON ANGIOPLASTY  02/23/2020   Procedure: PERIPHERAL VASCULAR BALLOON ANGIOPLASTY;  Surgeon: [name], MD;  Location: [place] [facility];  Service: Cardiovascular;;  Lt  AT  . PERIPHERAL VASCULAR INTERVENTION Right 08/10/2020   Procedure: PERIPHERAL VASCULAR INTERVENTION;  Surgeon: [name], MD;  Location: [place] [facility];  Service: Cardiovascular;  Laterality: Right;  . THROMBECTOMY BRACHIAL ARTERY Left 11/04/2020   Procedure: THROMBECTOMY RADIAL ARTERY with Bovine Patching.;  Surgeon: Cherre Robins, MD;  Location: [place];  Service: Vascular;  Laterality: Left;  . TRANSMETATARSAL AMPUTATION Left 02/24/2020   Procedure: TRANSMETATARSAL AMPUTATION LEFT;  Surgeon: Cherre Robins, MD;  Location: [place];  Service: Vascular;  Laterality: Left;  . TRANSMETATARSAL AMPUTATION Right 08/11/2020   Procedure: RIGHT TRANSMETATARSAL AMPUTATION;  Surgeon: Waynetta Sandy, MD;  Location: [place];  Service: Vascular;  Laterality: Right;  . TUBAL  LIGATION       OB History   No obstetric history on file.     Family History  Problem Relation Age of Onset  . Diabetes Mother   . Hypertension Mother   . Cancer Father   . Diabetes Father   . Hypertension Sister   . Diabetes Daughter   . Heart disease Daughter   . Hypertension Daughter   . Diabetes Son   . Heart disease Son   . Hypertension Son   . Peripheral vascular disease Son        amputation    Social History   Tobacco Use  . Smoking status: Former    Types: Cigarettes    Quit date: 07/25/1984    Years since  quitting: 36.3  . Smokeless tobacco: Never  Vaping Use  . Vaping Use: Never used  Substance Use Topics  . Alcohol use: No    Alcohol/week: 0.0 standard drinks  . Drug use: No    Home Medications Prior to Admission medications   Medication Sig Start Date End Date Taking? Authorizing Provider  acetaminophen (TYLENOL) 325 MG tablet Take 1-2 tablets (325-650 mg total) by mouth every 4 (four) hours as needed for mild pain. 07/04/20   Love, Ivan Anchors, PA-C  amiodarone (PACERONE) 200 MG tablet Take 1 tablet (200 mg total) by mouth daily for 19 days. 11/15/20 12/04/20  Shelly Coss, MD  apixaban (ELIQUIS) 5 MG TABS tablet Take 1 tablet (5 mg total) by mouth 2 (two) times daily. 11/14/20   Shelly Coss, MD  aspirin EC 81 MG tablet Take 81 mg by mouth at bedtime. Swallow whole.    [provider]  atorvastatin (LIPITOR) 40 MG tablet TAKE ONE TABLET BY MOUTH DAILY 08/21/20   Claretta Fraise, MD  diphenhydrAMINE (BENADRYL) 25 MG tablet Take 25 mg by mouth every 6 (six) hours as needed for itching.    [provider]  gabapentin (NEURONTIN) 300 MG capsule Take 1 capsule (300 mg total) by mouth at bedtime. 07/27/20   Bayard Hugger, NP  insulin detemir (LEVEMIR) 100 UNIT/ML injection Inject 0.16 mLs (16 Units total) into the skin daily. 08/31/20   Claretta Fraise, MD  Insulin Syringe-Needle U-100 28G X 1/2" 1 ML MISC Use with insulin daily Dx E11.22 08/31/20   Claretta Fraise, MD  midodrine (PROAMATINE) 5 MG tablet Take 1 tablet (5 mg total) by mouth 3 (three) times daily with meals. 11/14/20   Shelly Coss, MD  Misc. Devices (EGG CRATE BED PAD) MISC Use to treat and prevent bed sores 11/20/20   Claretta Fraise, MD  oxyCODONE (OXY IR/ROXICODONE) 5 MG immediate release tablet Take 1 tablet (5 mg total) by mouth every 6 (six) hours as needed for severe pain. 09/18/20   Elgergawy, Silver Huguenin, MD  polyethylene glycol (MIRALAX / GLYCOLAX) 17 g packet Take 17 g by mouth daily. 06/17/20   Regalado, Belkys  A, MD  senna-docusate (SENOKOT-S) 8.6-50 MG tablet Take 1 tablet by mouth 2 (two) times daily. 07/04/20   Love, Ivan Anchors, PA-C  sevelamer carbonate (RENVELA) 800 MG tablet Take 1,600-2,400 mg by mouth See admin instructions. Take 2400 mg with each meal and 1600 mg with each snack    [provider]  TRULICITY 1.5 0000000 SOPN INJECT CONTENTs OF ONE PEN UNDER THE SKIN WEEKLY ON SATURDAY 08/24/20   Claretta Fraise, MD    Allergies    Sulfa antibiotics  Review of Systems   Review of Systems  Constitutional:  Negative  for chills and fever.  HENT:  Negative for ear pain and sore throat.   Eyes:  Negative for pain and visual disturbance.  Respiratory:  Negative for cough and shortness of breath.   Cardiovascular:  Negative for chest pain and palpitations.  Gastrointestinal:  Negative for abdominal pain and vomiting.  Genitourinary:  Negative for dysuria and hematuria.  Musculoskeletal:  Negative for arthralgias and back pain.  Skin:  Positive for color change and wound. Negative for rash.  Neurological:  Positive for weakness. Negative for seizures and syncope.  All other systems reviewed and are negative.  Physical Exam Updated Vital Signs BP (!) 141/60   Pulse 83   Temp 99.2 F (37.3 C) (Oral)   Resp 17   Ht '5\' 4"'$  (1.626 m)   Wt 86.2 kg   LMP  (LMP Unknown)   SpO2 97%   BMI 32.62 kg/m   Physical Exam Vitals and nursing note reviewed. Exam conducted with a chaperone present.  Constitutional:      General: She is not in acute distress.    Appearance: Normal appearance. She is obese.     Comments: Patient lying to her side, appears uncomfortable  HENT:     Head: Normocephalic and atraumatic.  Eyes:     General: No scleral icterus.    Extraocular Movements: Extraocular movements intact.     Pupils: Pupils are equal, round, and reactive to light.  Cardiovascular:     Rate and Rhythm: Normal rate and regular rhythm.  Pulmonary:     Effort: Pulmonary effort is normal.      Breath sounds: Normal breath sounds.  Musculoskeletal:     Comments: Left hand is grossly necrotic.  Skin:    Coloration: Skin is not jaundiced.     Comments: Decubitus ulcers noted along the sacrum, these also appear to be necrotic, extremely tender to touch.  Neurological:     Mental Status: She is alert. Mental status is at baseline.     Coordination: Coordination normal.        ED Results / Procedures / Treatments   Labs (all labs ordered are listed, but only abnormal results are displayed) Labs Reviewed  CULTURE, BLOOD (ROUTINE X 2)  CULTURE, BLOOD (ROUTINE X 2)  COMPREHENSIVE METABOLIC PANEL  CBC WITH DIFFERENTIAL/PLATELET    EKG None  Radiology No results found.  Procedures Procedures   Medications Ordered in ED Medications - No data to display  ED Course  I have reviewed the triage vital signs and the nursing notes.  Pertinent labs & imaging results that were available during my care of the patient were reviewed by me and considered in my medical decision making (see chart for details).  Clinical Course as of 12/01/2020 1536  Mon Nov 27, 2020  [place] RN Donney Dice sent me a message with the following: Hi, I'm working with Ms [facility] regarding Chronic Care Management and I see that you have worked with her in the ED today. 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 She has not had the recommended MRI. I've been trying to coordinate some care for her since finding this out on Friday. I have talked with her daughter but not the patient. I think it's important to have that MRI and further testing because it may impact her medical decisions going forward. She may or may not elect to not pursue surgical interventions on her  hand and leg if it is cancerous. Please let me know if I can be of any assistance. Thank you.  [HS]    Clinical Course User Index [HS] Sherrill Raring, PA-C   MDM  Rules/Calculators/A&P                           Patient is hypertensive, otherwise vitals are relatively stable.  There are multiple areas of necrosis, do not see any cellulitis or exudate concerning for underlying infection.  Will consult with vascular surgery for their recommendation.  Patient will need to be admitted for dialysis as well.  Spoke with Dr. Oneida Alar with vascular surgery, he would like her transferred to [facility].  Will admit here and have the transfer done, he also advises consulting with hand surgery in case amputation is needed.  Spoke with Attending Dr. Brigitte Pulse who will admit patient and proceed with transfer to [facility].   Final Clinical Impression(s) / ED Diagnoses Final diagnoses:  None    Rx / DC Orders ED Discharge Orders     None        Sherrill Raring, PA-C 11/30/2020 1434    Lajean Saver, MD 11/29/20 [phone]

## 2020-11-27 NOTE — ED Notes (Signed)
Pt in dialysis, will obtain vitals when pt returns to ED.

## 2020-11-27 NOTE — ED Notes (Signed)
Pt upstairs to dialysis

## 2020-11-27 NOTE — ED Notes (Signed)
Pt difficult stick. IV obtained at 10:45am and lactic + blood cultures drawn at that time

## 2020-11-27 NOTE — Progress Notes (Addendum)
ANTICOAGULATION CONSULT NOTE - Initial Consult  Pharmacy Consult for lovenox Indication: atrial fibrillation  Allergies  Allergen Reactions   Sulfa Antibiotics Swelling    Patient Measurements: Height: '5\' 4"'$  (162.6 cm) Weight: 86.2 kg (190 lb 0.6 oz) IBW/kg (Calculated) : 54.7 Heparin Dosing Weight:   Vital Signs: Temp: 98.3 F (36.8 C) (08/01 1100) Temp Source: Rectal (08/01 1100) BP: 125/82 (08/01 1500) Pulse Rate: 77 (08/01 1500)  Labs: Recent Labs    12/02/2020 1021  HGB 8.3*  HCT 26.9*  PLT 737*  CREATININE 13.10*    Estimated Creatinine Clearance: 4.7 mL/min (A) (by C-G formula based on SCr of 13.1 mg/dL (H)).   Medical History: Past Medical History:  Diagnosis Date   Anemia    Arthritis    Asthma    in the past   CHF (congestive heart failure) ([place])    Diabetes mellitus with complication ([place])    Type 2   End stage renal disease on dialysis ([place])    Gangrene (HCC)    Hyperlipidemia    Hypertension     Medications:  (Not in a hospital admission)   Assessment: Pharmacy consulted to dose lovenox in patient with atrial fibrillation.  Patient is on apixaban prior to admission with last dose 7/31 @ 2030.   Goal of Therapy:   Monitor platelets by anticoagulation protocol: Yes   Plan:  Lovenox 80 mg subq every 24 hours. Monitor CBC and s/s of bleeding.  Margot Ables, PharmD Clinical Pharmacist 12/09/2020 3:18 PM

## 2020-11-27 NOTE — ED Notes (Signed)
Wounds visualized on buttocks, large amt of slough noted at center of sacrum/coccyx in butterfly fashion. Additional area of eschar noted to left flank area approx 4x4in. Areas of breakdown noted generally all of buttocks region, but primarily central in location. Addition large open area noted to right side of back/hip, but difficult to visualize. Appears atleast 5 inches in length

## 2020-11-28 ENCOUNTER — Inpatient Hospital Stay (HOSPITAL_COMMUNITY): Payer: Medicare Other

## 2020-11-28 ENCOUNTER — Ambulatory Visit: Payer: Medicare Other | Admitting: *Deleted

## 2020-11-28 ENCOUNTER — Encounter: Payer: Self-pay | Admitting: *Deleted

## 2020-11-28 DIAGNOSIS — I70268 Atherosclerosis of native arteries of extremities with gangrene, other extremity: Secondary | ICD-10-CM

## 2020-11-28 DIAGNOSIS — Z992 Dependence on renal dialysis: Secondary | ICD-10-CM | POA: Diagnosis not present

## 2020-11-28 DIAGNOSIS — N186 End stage renal disease: Secondary | ICD-10-CM | POA: Diagnosis not present

## 2020-11-28 DIAGNOSIS — Z89519 Acquired absence of unspecified leg below knee: Secondary | ICD-10-CM

## 2020-11-28 DIAGNOSIS — L8990 Pressure ulcer of unspecified site, unspecified stage: Secondary | ICD-10-CM

## 2020-11-28 LAB — BASIC METABOLIC PANEL
Anion gap: 16 — ABNORMAL HIGH (ref 5–15)
BUN: 40 mg/dL — ABNORMAL HIGH (ref 8–23)
CO2: 27 mmol/L (ref 22–32)
Calcium: 8.8 mg/dL — ABNORMAL LOW (ref 8.9–10.3)
Chloride: 90 mmol/L — ABNORMAL LOW (ref 98–111)
Creatinine, Ser: 8.16 mg/dL — ABNORMAL HIGH (ref 0.44–1.00)
GFR, Estimated: 5 mL/min — ABNORMAL LOW (ref >60)
Glucose, Bld: 70 mg/dL (ref 70–99)
Potassium: 4.5 mmol/L (ref 3.5–5.1)
Sodium: 133 mmol/L — ABNORMAL LOW (ref 135–145)

## 2020-11-28 LAB — CBC
HCT: 24.7 % — ABNORMAL LOW (ref 36.0–46.0)
Hemoglobin: 7.5 g/dL — ABNORMAL LOW (ref 12.0–15.0)
MCH: 22.9 pg — ABNORMAL LOW (ref 26.0–34.0)
MCHC: 30.4 g/dL (ref 30.0–36.0)
MCV: 75.5 fL — ABNORMAL LOW (ref 80.0–100.0)
Platelets: 703 10*3/uL — ABNORMAL HIGH (ref 150–400)
RBC: 3.27 MIL/uL — ABNORMAL LOW (ref 3.87–5.11)
RDW: 19.1 % — ABNORMAL HIGH (ref 11.5–15.5)
WBC: 22.4 10*3/uL — ABNORMAL HIGH (ref 4.0–10.5)
nRBC: 0 % (ref 0.0–0.2)

## 2020-11-28 LAB — MAGNESIUM: Magnesium: 2 mg/dL (ref 1.7–2.4)

## 2020-11-28 LAB — MRSA NEXT GEN BY PCR, NASAL: MRSA by PCR Next Gen: DETECTED — AB

## 2020-11-28 LAB — GLUCOSE, CAPILLARY
Glucose-Capillary: 96 mg/dL (ref 70–99)
Glucose-Capillary: 72 mg/dL (ref 70–99)
Glucose-Capillary: 164 mg/dL — ABNORMAL HIGH (ref 70–99)

## 2020-11-28 MED ORDER — DARBEPOETIN ALFA 150 MCG/0.3ML IJ SOSY: 150.0000 ug | PREFILLED_SYRINGE | INTRAMUSCULAR | Status: DC

## 2020-11-28 MED ORDER — DOXERCALCIFEROL 4 MCG/2ML IV SOLN
1.5000 ug | INTRAVENOUS | Status: DC
  Administered 2020-12-08 – 2020-12-14 (×2): 1.5 ug via INTRAVENOUS

## 2020-11-28 MED ORDER — COLLAGENASE 250 UNIT/GM EX OINT
TOPICAL_OINTMENT | Freq: Every day | CUTANEOUS | Status: DC
  Filled 2020-11-28 (×6): qty 30

## 2020-11-28 MED ORDER — MUPIROCIN 2 % EX OINT
1.0000 | TOPICAL_OINTMENT | Freq: Two times a day (BID) | CUTANEOUS | Status: AC
Start: 2020-11-28 — End: 2020-12-02
  Administered 2020-11-28 – 2020-12-02 (×10): 1 via NASAL
  Filled 2020-11-28 (×5): qty 22

## 2020-11-28 MED ORDER — CHLORHEXIDINE GLUCONATE CLOTH 2 % EX PADS
6.0000 | MEDICATED_PAD | Freq: Every day | CUTANEOUS | Status: AC
  Administered 2020-11-28 – 2020-12-02 (×5): 6 via TOPICAL

## 2020-11-28 NOTE — Consult Note (Signed)
ESRD Consult Note  Requesting provider: Shelly Coss, MD Service requesting consult: Hospitalist Reason for consult: ESRD, provision of dialysis Indication for acute dialysis?: End Stage Renal Disease   Assessment/Recommendations: Christy Zhang is a/an 65 y.o. female with a past medical history notable for ESRD on HD admitted with gangrene of the left hand   Dialysis Orders from previous admission:  [facility] - MWF 4 hrs 400/500 97 kg 2.0K/2.5 Ca 16g needles -Heparin 1000 units initial bolus-heparin pump 1000 units/hr stop 1 hour prior to end of tx  -Epogen 14000 units IV TIW -Hectorol 1.5 mcg IV TIW  # ESRD: Status post dialysis yesterday tolerated well.  Continue dialysis on MWF schedule. # Volume/ hypertension: Attempt to achieve EDW as tolerated. On midodrine # Anemia of Chronic Kidney Disease: Hemoglobin 7.5. No iron given possible infection. Aranesp 113mg weekly ordered for tomorrow.  # Secondary Hyperparathyroidism/Hyperphosphatemia: home sevlamer. 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 Vascular surgery being consulted  # Additional recommendations: - Dose all meds for creatinine clearance < 10 ml/min  - Unless absolutely necessary, no MRIs with gadolinium.  - Implement save arm precautions.  Prefer needle sticks in the dorsum of the hands or wrists.  No blood pressure measurements in arm. - If blood transfusion is requested during hemodialysis sessions, please alert [facility] to the session.   Recommendations were discussed with the primary team.   History of Present Illness: Christy Zhang a/an [age].o. female with a past medical history of ESRD who presents with painful back.  The patient presented to the emergency department yesterday for worsening pain in her lower back related to decubitus ulcers.  She states that it has been very painful and she was unable to go to  dialysis on Friday.  She had dialysis on Wednesday.  She also noted worsening coldness of her left hand.  She was recently hospitalized for pneumonia requiring intubation.  She also was on CRRT at that time.  This morning she is somewhat confused and has a difficult time relating any history.  Much of the history was obtained per chart review.  She states that she is having a lot of pain in her back this morning.  No issues with dialysis that she is aware of.  [facility]  No issues with dialysis yesterday.   Medications:  Current Facility-Administered Medications  Medication Dose Route Frequency Provider Last Rate Last Admin   0.9 %  sodium chloride infusion  100 mL Intravenous PRN ShManuella GhaziPratik D, DO       0.9 %  sodium chloride infusion  100 mL Intravenous PRN ShManuella GhaziPratik D, DO       0.9 %  sodium chloride infusion  250 mL Intravenous PRN ShManuella GhaziPratik D, DO       acetaminophen (TYLENOL) tablet 650 mg  650 mg Oral Q6H PRN ShManuella GhaziPratik D, DO       Or   acetaminophen (TYLENOL) suppository 650 mg  650 mg Rectal Q6H PRN ShManuella GhaziPratik D, DO       alteplase (CATHFLO ACTIVASE) injection 2 mg  2 mg Intracatheter Once PRN ShManuella GhaziPratik D, DO       amiodarone (PACERONE) tablet 200 mg  200 mg Oral Daily ShManuella GhaziPratik D, DO   200 mg at 11/28/20 0947   aspirin EC tablet 81  mg  81 mg Oral QHS Heath Lark D, DO   81 mg at 12/10/2020 2327   atorvastatin (LIPITOR) tablet 40 mg  40 mg Oral QPM Manuella Ghazi, Pratik D, DO       Chlorhexidine Gluconate Cloth 2 % PADS 6 each  6 each Topical Q0600 Manuella Ghazi, Pratik D, DO       Chlorhexidine Gluconate Cloth 2 % PADS 6 each  6 each Topical Q0600 Heath Lark D, DO   6 each at 11/28/20 0556   collagenase (SANTYL) ointment   Topical Daily Shelly Coss, MD       diphenhydrAMINE (BENADRYL) capsule 25 mg  25 mg Oral Q6H PRN Manuella Ghazi, Pratik D, DO       enoxaparin (LOVENOX) injection 80 mg  80 mg Subcutaneous Q24H Manuella Ghazi,  Pratik D, DO   80 mg at 12/02/2020 1652   gabapentin (NEURONTIN) capsule 300 mg  300 mg Oral QHS Manuella Ghazi, Pratik D, DO   300 mg at 12/10/2020 2327   heparin injection 1,000 Units  1,000 Units Dialysis PRN Heath Lark D, DO       heparin injection 1,700 Units  20 Units/kg Dialysis PRN Manuella Ghazi, Pratik D, DO       hydrocortisone (ANUSOL-HC) suppository 25 mg  25 mg Rectal BID Heath Lark D, DO   25 mg at 11/28/20 0947   HYDROmorphone (DILAUDID) injection 0.5-1 mg  0.5-1 mg Intravenous Q2H PRN Heath Lark D, DO   1 mg at 11/28/20 0545   insulin detemir (LEVEMIR) injection 16 Units  16 Units Subcutaneous QHS Heath Lark D, DO   16 Units at 11/28/20 [id number]   labetalol (NORMODYNE) injection 10 mg  10 mg Intravenous Q2H PRN Manuella Ghazi, Pratik D, DO       lidocaine (PF) (XYLOCAINE) 1 % injection 5 mL  5 mL Intradermal PRN Manuella Ghazi, Pratik D, DO       lidocaine-prilocaine (EMLA) cream 1 application  1 application Topical PRN Manuella Ghazi, Pratik D, DO       midodrine (PROAMATINE) tablet 5 mg  5 mg Oral TID WC Manuella Ghazi, Pratik D, DO   5 mg at 11/28/20 0946   mupirocin ointment (BACTROBAN) 2 % 1 application  1 application Nasal BID Heath Lark D, DO   1 application at [id number]   ondansetron (ZOFRAN) injection 4 mg  4 mg Intravenous Once Manuella Ghazi, Pratik D, DO       ondansetron (ZOFRAN) tablet 4 mg  4 mg Oral Q6H PRN Manuella Ghazi, Pratik D, DO       Or   ondansetron (ZOFRAN) injection 4 mg  4 mg Intravenous Q6H PRN Manuella Ghazi, Pratik D, DO       oxyCODONE (Oxy IR/ROXICODONE) immediate release tablet 5 mg  5 mg Oral Q6H PRN Manuella Ghazi, Pratik D, DO       pentafluoroprop-tetrafluoroeth (GEBAUERS) aerosol 1 application  1 application Topical PRN Manuella Ghazi, Pratik D, DO       polyethylene glycol (MIRALAX / GLYCOLAX) packet 17 g  17 g Oral Daily Manuella Ghazi, Pratik D, DO   17 g at 11/28/20 [id number]   senna-docusate (Senokot-S) tablet 1 tablet  1 tablet Oral BID Heath Lark D, DO   1 tablet at 11/28/20 [id number]   sevelamer carbonate (RENVELA) tablet 2,400 mg  2,400 mg Oral TID  with meals Manuella Ghazi, Pratik D, DO       sodium chloride flush (NS) 0.9 % injection 3 mL  3 mL Intravenous Q12H Shah, Pratik D, DO   3 mL at 11/28/20  [id number]   sodium chloride flush (NS) 0.9 % injection 3 mL  3 mL Intravenous PRN Manuella Ghazi, Pratik D, DO         ALLERGIES Sulfa antibiotics  MEDICAL HISTORY Past Medical History:  Diagnosis Date   Anemia    Arthritis    Asthma    in the past   CHF (congestive heart failure) ([place])    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   End stage renal disease on dialysis ([place])    Gangrene ([place])    Hyperlipidemia    Hypertension      SOCIAL HISTORY Social History   Socioeconomic History   Marital status: Widowed    Spouse name: Not on file   Number of children: Not on file   Years of education: Not on file   Highest education level: Not on file  Occupational History   Not on file  Tobacco Use   Smoking status: Former    Types: Cigarettes    Quit date: 07/25/1984    Years since quitting: 36.3   Smokeless tobacco: Never  Vaping Use   Vaping Use: Never used  Substance and Sexual Activity   Alcohol use: No    Alcohol/week: 0.0 standard drinks   Drug use: No   Sexual activity: Not on file  Other Topics Concern   Not on file  Social History Narrative   Not on file   Social Determinants of Health   Financial Resource Strain: Low Risk    Difficulty of Paying Living Expenses: Not very hard  Food Insecurity: No Food Insecurity   Worried About Running Out of Food in the Last Year: Never true   [place] in the Last Year: Never true  Transportation Needs: No Transportation Needs   Lack of Transportation (Medical): No   Lack of Transportation (Non-Medical): No  Physical Activity: Inactive   Days of Exercise per Week: 0 days   Minutes of Exercise per Session: 0 min  Stress: No Stress Concern Present   Feeling of Stress : Only a little  Social Connections: Socially Isolated   Frequency of Communication with Friends and  Family: More than three times a week   Frequency of Social Gatherings with Friends and Family: More than three times a week   Attends Religious Services: Never   Marine scientist or Organizations: No   Attends Archivist Meetings: Never   Marital Status: Widowed  Human resources officer Violence: Not on file     FAMILY HISTORY Family History  Problem Relation Age of Onset   Diabetes Mother    Hypertension Mother    Cancer Father    Diabetes Father    Hypertension Sister    Diabetes Daughter    Heart disease Daughter    Hypertension Daughter    Diabetes Son    Heart disease Son    Hypertension Son    Peripheral vascular disease Son        amputation     Review of Systems: 12 systems were reviewed and negative except per HPI  Physical Exam: Vitals:   11/28/20 0517 11/28/20 1025  BP: (!) 159/59 (!) 141/65  Pulse: 71 72  Resp: 18 19  Temp: 97.9 F (36.6 C) 99.5 F (37.5 C)  SpO2: 96% 100%   No intake/output data recorded.  Intake/Output Summary (Last 24 hours) at 11/28/2020 1033 Last data filed at 11/28/2020 0800 Gross per 24 hour  Intake 0 ml  Output  2014 ml  Net -2014 ml   General: Chronically ill-appearing, lying in bed, no distress HEENT: anicteric sclera, MMM CV: normal rate, no murmurs, no edema in the upper extremities Lungs: bilateral chest rise, normal wob Abd: soft, non-tender, non-distended Skin: Left hand with digital necrosis, cool to the touch, scattered abrasions, otherwise no rashes Psych: Tired but alert, engaged, appropriate mood and affect Neuro: normal speech, some mild confusion but able to answer most questions appropriately  Test Results Reviewed Lab Results  Component Value Date   NA 133 (L) 11/28/2020   K 4.5 11/28/2020   CL 90 (L) 11/28/2020   CO2 27 11/28/2020   BUN 40 (H) 11/28/2020   CREATININE 8.16 (H) 11/28/2020   CALCIUM 8.8 (L) 11/28/2020   ALBUMIN 2.1 (L) 12/07/2020   PHOS 9.7 (H) 12/09/2020    I have  reviewed relevant outside healthcare records

## 2020-11-28 NOTE — Progress Notes (Signed)
PROGRESS NOTE    Christy Zhang  [name] DOB: [birth year] DOA: 12/14/2020 PCP: Claretta Fraise, MD   Chief Complain:  Brief Narrative:  Patient is a 65 old female with history of bilateral BKA, ESRD on dialysis on Monday/Wednesday/Friday, type 2 diabetes, hypertension, peripheral vascular disease, hyperlipidemia, recently discharged from here on 11/14/2020 after prolonged hospitalization when she was admitted with altered mental status and had to be intubated for airway protection and also underwent CRRT in the setting of septic shock from pneumonia causing acute respiratory failure with hypoxia, AKI.  She presented at [facility] hospital ED because it was too painful to sit down for dialysis due to her decubitus ulcer and she also missed 2 session of dialysis.  Patient was sent to [facility] from [facility] for further management.  Nephrology,orthopedics,palliative care vascular surgery consulted.  Assessment & Plan:   Active Problems:   ESRD (end stage renal disease) on dialysis ([place])    ESRD on hemodialysis: She missed 2 sessions of dialysis due to inability to sit on the chair due to painful decubitus ulcer.  Nephrology has been consulted and following. Initial lab work showed hyperkalemia, continue to monitor, expect improvement with dialysis.  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  Continue local wound care.  Due to possible need for amputation of left fingertips, orthopedics were following/Vascular surgery was also following.  Orthopedics recommended outpatient follow-up on last discharge . With reconsulted orthopedics today and they recommended again outpatient follow-up with Dr. Grandville Silos.    History of paroxysmal A. fib/flutter: Currently in normal sinus rhythm.  On oral amiodarone.  On  eliquis at home, currently on hold in anticipation of possible procedure/debridgement of pressure ulcers    Hyperlipidemia: On statin   Leukocytosis/thrombocytosis: On reviewing her previous records, she always had chronic leukocytosis along with thrombocytosis.  Could not rule out hematological pathology.  We had sent a message to Dr. Irene Limbo during last discharge,arranged  outpatient follow-up.  We recommend outpatient follow-up on the given appointment date.   Normocytic anemia: Currently hemoglobin stable in the range of 7-8.  Continue to monitor. Associated with ESRD   Diabetes type 2:   Monitor blood sugars.  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  Sacral decubitus wound: She has painful sacral ulcers.  She also has some blackish changes on bilateral stumps on her BKA.  Wound care consulted.  Wound care recommended general surgery consultation for possible debridement.  General surgery consulted.   Vascular surgery also following for gangrenous changes/echar on the bilateral BKA stumps. No indication for abx  Goals of care: Multiple comorbidities.  Extremely poor quality of life.  Palliative care reconsulted .She  is a  limited code  Pressure Injury 11/28/20 Sacrum Unstageable - Full thickness tissue loss in which the base of the injury is covered by slough (yellow, tan, gray, green or brown) and/or eschar (tan, brown or black) in the wound bed. (Active)  11/28/20 0100  Location: Sacrum  Location Orientation:   Staging: Unstageable - Full thickness tissue loss in which the base of the injury is covered by slough (yellow, tan, gray, green or brown) and/or eschar (tan, brown or black) in the wound bed.  Wound Description (Comments):   Present on Admission: Yes     Pressure Injury 11/28/20 Buttocks Left Unstageable - Full thickness  tissue loss in which the base of the injury is covered by slough (yellow, tan, gray, green or brown) and/or  eschar (tan, brown or black) in the wound bed. (Active)  11/28/20 0100  Location: Buttocks  Location Orientation: Left  Staging: Unstageable - Full thickness tissue loss in which the base of the injury is covered by slough (yellow, tan, gray, green or brown) and/or eschar (tan, brown or black) in the wound bed.  Wound Description (Comments):   Present on Admission: Yes     Pressure Injury 11/28/20 Vertebral column Lower Unstageable - Full thickness tissue loss in which the base of the injury is covered by slough (yellow, tan, gray, green or brown) and/or eschar (tan, brown or black) in the wound bed. (Active)  11/28/20 0100  Location: Vertebral column  Location Orientation: Lower  Staging: Unstageable - Full thickness tissue loss in which the base of the injury is covered by slough (yellow, tan, gray, green or brown) and/or eschar (tan, brown or black) in the wound bed.  Wound Description (Comments):   Present on Admission: Yes     Pressure Injury 11/28/20 Ischial tuberosity Right Unstageable - Full thickness tissue loss in which the base of the injury is covered by slough (yellow, tan, gray, green or brown) and/or eschar (tan, brown or black) in the wound bed. (Active)  11/28/20 0100  Location: Ischial tuberosity  Location Orientation: Right  Staging: Unstageable - Full thickness tissue loss in which the base of the injury is covered by slough (yellow, tan, gray, green or brown) and/or eschar (tan, brown or black) in the wound bed.  Wound Description (Comments):   Present on Admission: Yes     Pressure Injury 11/28/20 Back Right;Lower Unstageable - Full thickness tissue loss in which the base of the injury is covered by slough (yellow, tan, gray, green or brown) and/or eschar (tan, brown or black) in the wound bed. (Active)  11/28/20 0100  Location: Back  Location Orientation: Right;Lower  Staging: Unstageable - Full thickness tissue loss in which the base of the injury is covered by  slough (yellow, tan, gray, green or brown) and/or eschar (tan, brown or black) in the wound bed.  Wound Description (Comments):   Present on Admission: Yes     Pressure Injury 11/28/20 Breast Right Deep Tissue Pressure Injury - Purple or maroon localized area of discolored intact skin or blood-filled blister due to damage of underlying soft tissue from pressure and/or shear. (Active)  11/28/20 0100  Location: Breast  Location Orientation: Right  Staging: Deep Tissue Pressure Injury - Purple or maroon localized area of discolored intact skin or blood-filled blister due to damage of underlying soft tissue from pressure and/or shear.  Wound Description (Comments):   Present on Admission: Yes     Pressure Injury 11/28/20 Knee Anterior;Left Unstageable - Full thickness tissue loss in which the base of the injury is covered by slough (yellow, tan, gray, green or brown) and/or eschar (tan, brown or black) in the wound bed. (Active)  11/28/20 0100  Location: Knee (stump)  Location Orientation: Anterior;Left  Staging: Unstageable - Full thickness tissue loss in which the base of the injury is covered by slough (yellow, tan, gray, green or brown) and/or eschar (tan, brown or black) in the wound bed.  Wound Description (Comments):   Present on Admission: Yes     Pressure Injury 11/28/20 Knee Anterior;Left Unstageable - Full thickness tissue loss in which the base of the injury is covered by slough (yellow, tan,  gray, green or brown) and/or eschar (tan, brown or black) in the wound bed. (Active)  11/28/20 0100  Location: Knee  Location Orientation: Anterior;Left  Staging: Unstageable - Full thickness tissue loss in which the base of the injury is covered by slough (yellow, tan, gray, green or brown) and/or eschar (tan, brown or black) in the wound bed.  Wound Description (Comments):   Present on Admission: Yes     Pressure Injury 11/28/20 Flank Right Deep Tissue Pressure Injury - Purple or maroon  localized area of discolored intact skin or blood-filled blister due to damage of underlying soft tissue from pressure and/or shear. (Active)  11/28/20 0100  Location: Flank  Location Orientation: Right  Staging: Deep Tissue Pressure Injury - Purple or maroon localized area of discolored intact skin or blood-filled blister due to damage of underlying soft tissue from pressure and/or shear.  Wound Description (Comments):   Present on Admission: Yes     Pressure Injury 11/28/20 Flank Right Unstageable - Full thickness tissue loss in which the base of the injury is covered by slough (yellow, tan, gray, green or brown) and/or eschar (tan, brown or black) in the wound bed. (Active)  11/28/20 0100  Location: Flank  Location Orientation: Right  Staging: Unstageable - Full thickness tissue loss in which the base of the injury is covered by slough (yellow, tan, gray, green or brown) and/or eschar (tan, brown or black) in the wound bed.  Wound Description (Comments):   Present on Admission: Yes     Pressure Injury 11/28/20 Flank Right;Lateral Unstageable - Full thickness tissue loss in which the base of the injury is covered by slough (yellow, tan, gray, green or brown) and/or eschar (tan, brown or black) in the wound bed. (Active)  11/28/20 0100  Location: Flank  Location Orientation: Right;Lateral  Staging: Unstageable - Full thickness tissue loss in which the base of the injury is covered by slough (yellow, tan, gray, green or brown) and/or eschar (tan, brown or black) in the wound bed.  Wound Description (Comments):   Present on Admission: Yes     Pressure Injury 11/28/20 Ischial tuberosity Left Unstageable - Full thickness tissue loss in which the base of the injury is covered by slough (yellow, tan, gray, green or brown) and/or eschar (tan, brown or black) in the wound bed. (Active)  11/28/20 0100  Location: Ischial tuberosity  Location Orientation: Left  Staging: Unstageable - Full thickness  tissue loss in which the base of the injury is covered by slough (yellow, tan, gray, green or brown) and/or eschar (tan, brown or black) in the wound bed.  Wound Description (Comments):   Present on Admission: Yes              DVT prophylaxis:Lovenox Code Status: Full Family Communication: Discussed with patient,no family members at bedside Status is: Inpatient  Remains inpatient appropriate because:Inpatient level of care appropriate due to severity of illness  Dispo: The patient is from: Home              Anticipated d/c is to: Home              Patient currently is not medically stable to d/c.   Difficult to place patient No    Consultants: Nephrology, vascular surgery, palliative care  Procedures: None yet  Antimicrobials:  Anti-infectives (From admission, onward)    None       Subjective: Patient seen and examined at the bedside this morning.  Hemodynamically stable during my evaluation.  She was alert and awake.  She complains of severe pain in her sacral ulcer.  Objective: Vitals:   12/24/2020 2230 12/13/2020 2300 11/28/20 0123 11/28/20 0517  BP: (!) 150/70 (!) 146/128 (!) 147/71 (!) 159/59  Pulse: 82 78 76 71  Resp: '18 14 19 18  '$ Temp:   98.3 F (36.8 C) 97.9 F (36.6 C)  TempSrc:   Oral Oral  SpO2: 95% 96% 98% 96%  Weight:    89.8 kg  Height:        Intake/Output Summary (Last 24 hours) at 11/28/2020 0748 Last data filed at 12/04/2020 2031 Gross per 24 hour  Intake --  Output 2014 ml  Net -2014 ml   Filed Weights   12/17/2020 1001 12/18/2020 1720 11/28/20 0517  Weight: 86.2 kg 86.2 kg 89.8 kg    Examination:  General exam: Very deconditioned, chronically ill looking HEENT: PERRL Respiratory system:  no wheezes or crackles  Cardiovascular system: S1 & S2 heard, RRR.  Gastrointestinal system: Abdomen is nondistended, soft and nontender. Central nervous system: Alert and oriented Extremities: Bilateral BKA, scar on bilateral stumps on BKA,  gangrenous left hand, dialysis access on the right upper extremity Skin: Pressure ulcers described as above    Data Reviewed: I have personally reviewed following labs and imaging studies  CBC: Recent Labs  Lab 11/23/20 2221 12/23/2020 1021 12/27/2020 1513  WBC 22.8* 23.6* 24.9*  NEUTROABS 18.6* 20.5*  --   HGB 7.9* 8.3* 8.0*  HCT 25.2* 26.9* 25.9*  MCV 78.5* 76.4* 76.9*  PLT 770* 737* 123XX123*   Basic Metabolic Panel: Recent Labs  Lab 11/23/20 2221 12/11/2020 1021 12/14/2020 1513  NA 134* 132* 130*  K 4.0 5.8* 5.9*  CL 96* 94* 92*  CO2 '26 22 22  '$ GLUCOSE 100* 126* 124*  BUN 51* 81* 83*  CREATININE 9.28* 13.10* 13.93*  CALCIUM 8.3* 8.1* 7.9*  PHOS  --   --  9.7*   GFR: Estimated Creatinine Clearance: 4.5 mL/min (A) (by C-G formula based on SCr of 13.93 mg/dL (H)). Liver Function Tests: Recent Labs  Lab 11/23/20 2221 12/25/2020 1021 12/08/2020 1513  AST 13* 12*  --   ALT 10 10  --   ALKPHOS 68 79  --   BILITOT 0.7 1.0  --   PROT 6.8 7.2  --   ALBUMIN 2.0* 2.1* 2.1*   Recent Labs  Lab 11/23/20 2221  LIPASE 20   No results for input(s): AMMONIA in the last 168 hours. Coagulation Profile: Recent Labs  Lab 11/23/20 2221  INR 2.7*   Cardiac Enzymes: No results for input(s): CKTOTAL, CKMB, CKMBINDEX, TROPONINI in the last 168 hours. BNP (last 3 results) No results for input(s): PROBNP in the last 8760 hours. HbA1C: No results for input(s): HGBA1C in the last 72 hours. CBG: Recent Labs  Lab 11/28/20 0131 11/28/20 0639  GLUCAP 96 72   Lipid Profile: No results for input(s): CHOL, HDL, LDLCALC, TRIG, CHOLHDL, LDLDIRECT in the last 72 hours. Thyroid Function Tests: No results for input(s): TSH, T4TOTAL, FREET4, T3FREE, THYROIDAB in the last 72 hours. Anemia Panel: No results for input(s): VITAMINB12, FOLATE, FERRITIN, TIBC, IRON, RETICCTPCT in the last 72 hours. Sepsis Labs: Recent Labs  Lab 11/23/20 2221  LATICACIDVEN 1.6    Recent Results (from the past  240 hour(s))  Resp Panel by RT-PCR (Flu A&B, Covid) Nasopharyngeal Swab     Status: None   Collection Time: 11/23/20 10:20 PM   Specimen: Nasopharyngeal Swab; Nasopharyngeal(NP) swabs in vial transport  medium  Result Value Ref Range Status   SARS Coronavirus 2 by RT PCR NEGATIVE NEGATIVE Final    Comment: (NOTE) SARS-CoV-2 target nucleic acids are NOT DETECTED.  The SARS-CoV-2 RNA is generally detectable in upper respiratory specimens during the acute phase of infection. The lowest concentration of SARS-CoV-2 viral copies this assay can detect is 138 copies/mL. A negative result does not preclude SARS-Cov-2 infection and should not be used as the sole basis for treatment or other patient management decisions. A negative result may occur with  improper specimen collection/handling, submission of specimen other than nasopharyngeal swab, presence of viral mutation(s) within the areas targeted by this assay, and inadequate number of viral copies(<138 copies/mL). A negative result must be combined with clinical observations, patient history, and epidemiological information. The expected result is Negative.  Fact Sheet for Patients:  EntrepreneurPulse.com.au  Fact Sheet for Healthcare Providers:  IncredibleEmployment.be  This test is no t yet approved or cleared by the [place] FDA and  has been authorized for detection and/or diagnosis of SARS-CoV-2 by FDA under an Emergency Use Authorization (EUA). This EUA will remain  in effect (meaning this test can be used) for the duration of the COVID-19 declaration under Section 564(b)(1) of the Act, 21 U.S.C.section 360bbb-3(b)(1), unless the authorization is terminated  or revoked sooner.       Influenza A by PCR NEGATIVE NEGATIVE Final   Influenza B by PCR NEGATIVE NEGATIVE Final    Comment: (NOTE) The Xpert Xpress SARS-CoV-2/FLU/RSV plus assay is intended as an aid in the diagnosis of influenza  from Nasopharyngeal swab specimens and should not be used as a sole basis for treatment. Nasal washings and aspirates are unacceptable for Xpert Xpress SARS-CoV-2/FLU/RSV testing.  Fact Sheet for Patients: EntrepreneurPulse.com.au  Fact Sheet for Healthcare Providers: IncredibleEmployment.be  This test is not yet approved or cleared by the [place] FDA and has been authorized for detection and/or diagnosis of SARS-CoV-2 by FDA under an Emergency Use Authorization (EUA). This EUA will remain in effect (meaning this test can be used) for the duration of the COVID-19 declaration under Section 564(b)(1) of the Act, 21 U.S.C. section 360bbb-3(b)(1), unless the authorization is terminated or revoked.  Performed at [facility], [address], [place], [place] [address]   Culture, blood (routine x 2)     Status: None (Preliminary result)   Collection Time: 11/23/20 10:22 PM   Specimen: BLOOD LEFT FOREARM  Result Value Ref Range Status   Specimen Description BLOOD LEFT FOREARM  Final   Special Requests   Final    Blood Culture adequate volume BOTTLES DRAWN AEROBIC AND ANAEROBIC   Culture   Final    NO GROWTH 4 DAYS Performed at [facility], [address] [place], [place] [address]    Report Status PENDING  Incomplete  Resp Panel by RT-PCR (Flu A&B, Covid) Nasopharyngeal Swab     Status: None   Collection Time: 12/24/2020 10:26 AM   Specimen: Nasopharyngeal Swab; Nasopharyngeal(NP) swabs in vial transport medium  Result Value Ref Range Status   SARS Coronavirus 2 by RT PCR NEGATIVE NEGATIVE Final    Comment: (NOTE) SARS-CoV-2 target nucleic acids are NOT DETECTED.  The SARS-CoV-2 RNA is generally detectable in upper respiratory specimens during the acute phase of infection. The lowest concentration of SARS-CoV-2 viral copies this assay can detect is 138 copies/mL. A negative result does not preclude SARS-Cov-2 infection and should not be  used as the sole basis for treatment or other patient  management decisions. A negative result may occur with  improper specimen collection/handling, submission of specimen other than nasopharyngeal swab, presence of viral mutation(s) within the areas targeted by this assay, and inadequate number of viral copies(<138 copies/mL). A negative result must be combined with clinical observations, patient history, and epidemiological information. The expected result is Negative.  Fact Sheet for Patients:  EntrepreneurPulse.com.au  Fact Sheet for Healthcare Providers:  IncredibleEmployment.be  This test is no t yet approved or cleared by the [place] FDA and  has been authorized for detection and/or diagnosis of SARS-CoV-2 by FDA under an Emergency Use Authorization (EUA). This EUA will remain  in effect (meaning this test can be used) for the duration of the COVID-19 declaration under Section 564(b)(1) of the Act, 21 U.S.C.section 360bbb-3(b)(1), unless the authorization is terminated  or revoked sooner.       Influenza A by PCR NEGATIVE NEGATIVE Final   Influenza B by PCR NEGATIVE NEGATIVE Final    Comment: (NOTE) The Xpert Xpress SARS-CoV-2/FLU/RSV plus assay is intended as an aid in the diagnosis of influenza from Nasopharyngeal swab specimens and should not be used as a sole basis for treatment. Nasal washings and aspirates are unacceptable for Xpert Xpress SARS-CoV-2/FLU/RSV testing.  Fact Sheet for Patients: EntrepreneurPulse.com.au  Fact Sheet for Healthcare Providers: IncredibleEmployment.be  This test is not yet approved or cleared by the [place] FDA and has been authorized for detection and/or diagnosis of SARS-CoV-2 by FDA under an Emergency Use Authorization (EUA). This EUA will remain in effect (meaning this test can be used) for the duration of the COVID-19 declaration under Section  564(b)(1) of the Act, 21 U.S.C. section 360bbb-3(b)(1), unless the authorization is terminated or revoked.  Performed at [facility], [address] [place], [place] [address]   Blood culture (routine x 2)     Status: None (Preliminary result)   Collection Time: 12/22/2020 10:45 AM   Specimen: BLOOD  Result Value Ref Range Status   Specimen Description BLOOD LEFT ARM  Final   Special Requests   Final    BOTTLES DRAWN AEROBIC AND ANAEROBIC Blood Culture adequate volume Performed at [facility], [address], [place], [place] [address]    Culture PENDING  Incomplete   Report Status PENDING  Incomplete  Blood culture (routine x 2)     Status: None (Preliminary result)   Collection Time: 12/10/2020 12:42 PM   Specimen: Left Antecubital; Blood  Result Value Ref Range Status   Specimen Description LEFT ANTECUBITAL  Final   Special Requests   Final    Blood Culture adequate volume BOTTLES DRAWN AEROBIC AND ANAEROBIC Performed at [facility], [address], [place], [place] [address]    Culture PENDING  Incomplete   Report Status PENDING  Incomplete  MRSA Next Gen by PCR, Nasal     Status: Abnormal   Collection Time: 11/28/20  1:41 AM   Specimen: Nasal Mucosa; Nasal Swab  Result Value Ref Range Status   MRSA by PCR Next Gen DETECTED (A) NOT DETECTED Final    Comment: RESULT CALLED TO, READ BACK BY AND VERIFIED WITH: Starr Sinclair RN, AT [phone] 11/28/20 D. VANHOOK (NOTE) The GeneXpert MRSA Assay (FDA approved for NASAL specimens only), is one component of a comprehensive MRSA colonization surveillance program. It is not intended to diagnose MRSA infection nor to guide or monitor treatment for MRSA infections. Test performance is not FDA approved in patients less than 37 years old. Performed at [facility],  [address] [place],  [address]          Radiology Studies: DG Chest Portable 1 View  Result Date: 12/10/2020 CLINICAL DATA:  Generalized weakness. EXAM:  PORTABLE CHEST 1 VIEW COMPARISON:  Multiple priors, most recent chest x-ray dated November 23, 2020 3: FINDINGS: Cardiac and mediastinal contours are unchanged. Mild bibasilar opacities, likely due to atelectasis. No focal consolidation. No large pleural effusion or evidence of pneumothorax. Vascular stent projecting over the right axilla. Left IJ line, ET tube and enteric tube have been removed when compared with prior x-ray. IMPRESSION: Mild bibasilar opacities, likely due to atelectasis. Electronically Signed   By: Yetta Glassman MD   On: 12/04/2020 11:11        Scheduled Meds:  amiodarone  200 mg Oral Daily   aspirin EC  81 mg Oral QHS   atorvastatin  40 mg Oral QPM   Chlorhexidine Gluconate Cloth  6 each Topical Q0600   Chlorhexidine Gluconate Cloth  6 each Topical Q0600   enoxaparin (LOVENOX) injection  80 mg Subcutaneous Q24H   gabapentin  300 mg Oral QHS   hydrocortisone  25 mg Rectal BID   insulin detemir  16 Units Subcutaneous QHS   midodrine  5 mg Oral TID WC   mupirocin ointment  1 application Nasal BID   ondansetron (ZOFRAN) IV  4 mg Intravenous Once   polyethylene glycol  17 g Oral Daily   senna-docusate  1 tablet Oral BID   sevelamer carbonate  2,400 mg Oral TID with meals   sodium chloride flush  3 mL Intravenous Q12H   Continuous Infusions:  sodium chloride     sodium chloride     sodium chloride       LOS: 1 day    Time spent: 35 mins.More than 50% of that time was spent in counseling and/or coordination of care.      Shelly Coss, MD [facility] P8/05/2020, 7:48 AM

## 2020-11-28 NOTE — Patient Instructions (Signed)
Visit Information  PATIENT GOALS:  Goals Addressed             This Visit's Progress    Maintain Independence and Quallity of Life: PVD with Multiple Amputations   On track    Timeframe:  Long-Range Goal Priority:  Medium Start Date:  09/22/20                           Expected End Date:                        Follow Up Date within 1 week of hospital discharge. Admitted on 11/30/2020.  Meet with vascular surgeon while inpatient to determine appropriate treatment      Manage ESRD   On track    Timeframe:  Long-Range Goal Priority:  High Start Date:                             Expected End Date:                        Follow Up Date within 1 week of hospital discharge. Admitted on 11/29/2020.  Work with hospital staff and nephrologist to have dialysis and manage ESRD while hospitalized     Monitor and Manage My Blood Sugar-Diabetes Type 2   On track    Timeframe:  Long-Range Goal Priority:  Medium Start Date:  07/27/20                           Expected End Date: 07/27/21                      Follow Up Date within 1 week of hospital discharge. Admitted on 12/24/2020.   Check and record blood sugar twice daily Call PCP [phone] with any readings below 70 or above 200 Eat meals at regular intervals Follow a DASH diet and limit sugar and simple carbohydrates Keep all medical appointments Call RN Care Manager as needed [phone]    Why is this important?   Checking your blood sugar at home helps to keep it from getting very high or very low.  Writing the results in a diary or log helps the doctor know how to care for you.  Your blood sugar log should have the time, date and the results.  Also, write down the amount of insulin or other medicine that you take.  Other information, like what you ate, exercise done and how you were feeling, will also be helpful.     Notes:         Patient verbalizes understanding of instructions provided today and agrees to view in [place].    Plan: The patient has been provided with contact information for the care management team and has been advised to call with any health related questions or concerns.  and RNCM will follow-up with patient/daughter within 7 days of hospital discharge.   Chong Sicilian, BSN, RN-BC Embedded Chronic Care Manager [facility] Direct Dial: [phone]

## 2020-11-28 NOTE — Consult Note (Signed)
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  This is followed by Dr Grandville Silos.  Ortho eval earlier today continued outpt management.  She also has bilateral BKA's.  Right BKA Dr Donzetta Matters.  Left BKA Dr Stanford Breed.  Left radial artery thrombectomy Dr Stanford Breed.  PE:  Left hand multiple gangrenous fingers Dry eschar over radial incision with no palpable pulse.  Arm is warm at the wrist level.  No ongoing obvious infection.  BKA stumps with eschar bilaterally.  A: non salvageable left hand no further vascular procedures.  BKA stumps with eschar will eventually need debridement Will d/w Dr Stanford Breed on whether he wants to watch these for awhile or debride while in hospital  Pt would definitely benefit from palliative care consult  Ruta Hinds, MD [facility] Office: [phone]

## 2020-11-28 NOTE — Consult Note (Signed)
Consult Note  Christy Zhang [facility] 1955-10-05  [id number].    Requesting MD: Dr. Tawanna Solo Chief Complaint/Reason for Consult: sacral decubitus wound  HPI:  65 yo female with medical history significant for ESRD on HD M/W/F, bilateral BKA, T2DM, HTN, PVD, hyperlipidemia who was recently discharged from the hospital on 7/19 after prolonged hospitalization during which she had AMS, was intubated for airway protection, PNA with ARF, septic shock, AKI undergoing CRRT. She presented to [facility] ED due to inability to sit down for dialysis secondary to pain. She had missed 2 dialysis appointments. She was transferred to [facility] for further care.  On evaluation here she has a sacral decubitus wound and wounds of right flank and back and we were asked to see for possible debridement.  She states she has had the wound since at least prior to last discharge but had not had any significant pain. Sacral pain began a few days prior to presentation to ED and limits sitting. She confirms missing dialysis sessions. She states she has had loose stools recently and some loss of control of stools. She either uses depends or a bedpan and says her daughter takes care of her.   ROS: Review of Systems  Constitutional:  Negative for chills and fever.  Respiratory:  Negative for cough and shortness of breath.   Cardiovascular:  Negative for chest pain and palpitations.  Gastrointestinal:  Positive for diarrhea. Negative for abdominal pain, constipation, nausea and vomiting.  Genitourinary:        Infrequent urination   Family History  Problem Relation Age of Onset   Diabetes Mother    Hypertension Mother    Cancer Father    Diabetes Father    Hypertension Sister    Diabetes Daughter    Heart disease Daughter    Hypertension Daughter    Diabetes Son    Heart disease Son    Hypertension Son    Peripheral vascular disease Son        amputation    Past Medical History:  Diagnosis Date   Anemia    Arthritis     Asthma    in the past   CHF (congestive heart failure) ([place])    Diabetes mellitus with complication ([place])    Type 2   End stage renal disease on dialysis ([place])    Gangrene ([place])    Hyperlipidemia    Hypertension     Past Surgical History:  Procedure Laterality Date    LEFT BELOW KNEE AMPUTATION (Left Knee)  06/08/2020   A/V FISTULAGRAM Right 02/04/2017   Procedure: A/V Fistulagram;  Surgeon: Waynetta Sandy, MD;  Location: [place] [facility];  Service: Cardiovascular;  Laterality: Right;   ABDOMINAL AORTOGRAM W/LOWER EXTREMITY N/A 11/18/2019   Procedure: ABDOMINAL AORTOGRAM W/LOWER EXTREMITY;  Surgeon: [name], MD;  Location: [place] [facility];  Service: Cardiovascular;  Laterality: N/A;   ABDOMINAL AORTOGRAM W/LOWER EXTREMITY N/A 02/23/2020   Procedure: ABDOMINAL AORTOGRAM W/LOWER EXTREMITY;  Surgeon: [name], MD;  Location: [place] [facility];  Service: Cardiovascular;  Laterality: N/A;  Bilateral   ABDOMINAL AORTOGRAM W/LOWER EXTREMITY N/A 08/10/2020   Procedure: ABDOMINAL AORTOGRAM W/LOWER EXTREMITY;  Surgeon: [name], MD;  Location: [place] [facility];  Service: Cardiovascular;  Laterality: N/A;   AMPUTATION Left 06/08/2020   Procedure: LEFT BELOW KNEE AMPUTATION;  Surgeon: Cherre Robins, MD;  Location: [place];  Service: Vascular;  Laterality: Left;   AMPUTATION Right 09/14/2020  Procedure: RIGHT BELOW KNEE AMPUTATION;  Surgeon: Waynetta Sandy, MD;  Location: [place];  Service: Vascular;  Laterality: Right;   AV FISTULA PLACEMENT Right 08/25/2014   Procedure: RIGHT BRACHIOCEPHALIC ARTERIOVENOUS (AV) FISTULA CREATION;  Surgeon: Mal Misty, MD;  Location: [place];  Service: Vascular;  Laterality: Right;   AV FISTULA PLACEMENT Right 03/18/2019   Procedure: INSERTION OF ARTERIOVENOUS (AV) GORE-TEX GRAFT RIGHT ARM;  Surgeon: Serafina Mitchell, MD;  Location: [place];  Service: Vascular;  Laterality: Right;   [place] Right 12/24/2018   Procedure: FIRST STAGE BASILIC VEIN TRANSPOSITION RIGHT UPPER ARM;  Surgeon: Serafina Mitchell, MD;  Location: [place];  Service: Vascular;  Laterality: Right;   FISTULA SUPERFICIALIZATION Right 11/03/2014   Procedure: RIGHT UPPER ARM FISTULA SUPERFICIALIZATION;  Surgeon: Mal Misty, MD;  Location: [place];  Service: Vascular;  Laterality: Right;   FISTULOGRAM Right 01/16/2016   Procedure: RIGHT UPPER EXTREMITY ARTERIOVENOUS FISTULOGRAM WITH BALLOON ANGIOPLASTY;  Surgeon: Vickie Epley, MD;  Location: [facility];  Service: Vascular;  Laterality: Right;   INSERTION OF DIALYSIS CATHETER     X4   IR FLUORO GUIDE CV LINE RIGHT  12/03/2018   IR REMOVAL TUN CV CATH W/O FL  07/06/2019   IR THROMBECTOMY AV FISTULA W/THROMBOLYSIS/PTA INC/SHUNT/IMG RIGHT Right 12/03/2018   IR THROMBECTOMY AV FISTULA W/THROMBOLYSIS/PTA/STENT INC/SHUNT/IMG RT Right 10/11/2019   IR US GUIDE VASC ACCESS RIGHT  12/03/2018   IR US GUIDE VASC ACCESS RIGHT  12/03/2018   IR US GUIDE VASC ACCESS RIGHT  10/14/2019   LIGATION OF COMPETING BRANCHES OF ARTERIOVENOUS FISTULA Right 11/03/2014   Procedure: LIGATION OF COMPETING BRANCHE x1 OF RIGHT UPPER ARM ARTERIOVENOUS FISTULA;  Surgeon: Mal Misty, MD;  Location: [place];  Service: Vascular;  Laterality: Right;   PERIPHERAL VASCULAR BALLOON ANGIOPLASTY Right 02/04/2017   Procedure: PERIPHERAL VASCULAR BALLOON ANGIOPLASTY;  Surgeon: Waynetta Sandy, MD;  Location: [place] [facility];  Service: Cardiovascular;  Laterality: Right;   PERIPHERAL VASCULAR BALLOON ANGIOPLASTY Right 11/18/2019   Procedure: PERIPHERAL VASCULAR BALLOON ANGIOPLASTY;  Surgeon: [name], MD;  Location: [place] [facility];  Service: Cardiovascular;  Laterality: Right;  anterior tibial   PERIPHERAL VASCULAR BALLOON ANGIOPLASTY  02/23/2020   Procedure: PERIPHERAL VASCULAR BALLOON ANGIOPLASTY;  Surgeon: [name], MD;  Location: [place] [facility];  Service:  Cardiovascular;;  Lt  AT   PERIPHERAL VASCULAR INTERVENTION Right 08/10/2020   Procedure: PERIPHERAL VASCULAR INTERVENTION;  Surgeon: [name], MD;  Location: [place] [facility];  Service: Cardiovascular;  Laterality: Right;   THROMBECTOMY BRACHIAL ARTERY Left 11/04/2020   Procedure: THROMBECTOMY RADIAL ARTERY with Bovine Patching.;  Surgeon: Cherre Robins, MD;  Location: [place];  Service: Vascular;  Laterality: Left;   TRANSMETATARSAL AMPUTATION Left 02/24/2020   Procedure: TRANSMETATARSAL AMPUTATION LEFT;  Surgeon: Cherre Robins, MD;  Location: [place];  Service: Vascular;  Laterality: Left;   TRANSMETATARSAL AMPUTATION Right 08/11/2020   Procedure: RIGHT TRANSMETATARSAL AMPUTATION;  Surgeon: Waynetta Sandy, MD;  Location: [place];  Service: Vascular;  Laterality: Right;   TUBAL LIGATION      Social History:  reports that she quit smoking about 36 years ago. Her smoking use included cigarettes. She has never used smokeless tobacco. She reports that she does not drink alcohol and does not use drugs.  Allergies:  Allergies  Allergen Reactions   Sulfa Antibiotics Swelling    Medications Prior to Admission  Medication Sig Dispense  Refill   amiodarone (PACERONE) 200 MG tablet Take 1 tablet (200 mg total) by mouth daily for 19 days. 19 tablet 0   apixaban (ELIQUIS) 5 MG TABS tablet Take 1 tablet (5 mg total) by mouth 2 (two) times daily. 60 tablet 1   aspirin EC 81 MG tablet Take 81 mg by mouth at bedtime. Swallow whole.     atorvastatin (LIPITOR) 40 MG tablet TAKE ONE TABLET BY MOUTH DAILY 90 tablet 0   diphenhydrAMINE (BENADRYL) 25 MG tablet Take 25 mg by mouth every 6 (six) hours as needed for itching.     gabapentin (NEURONTIN) 300 MG capsule Take 1 capsule (300 mg total) by mouth at bedtime. 30 capsule 1   insulin detemir (LEVEMIR) 100 UNIT/ML injection Inject 0.16 mLs (16 Units total) into the skin daily. 5 mL 11   midodrine (PROAMATINE) 5 MG tablet Take 1 tablet (5  mg total) by mouth 3 (three) times daily with meals. 90 tablet 0   oxyCODONE (OXY IR/ROXICODONE) 5 MG immediate release tablet Take 1 tablet (5 mg total) by mouth every 6 (six) hours as needed for severe pain. 10 tablet 0   senna-docusate (SENOKOT-S) 8.6-50 MG tablet Take 1 tablet by mouth 2 (two) times daily. 60 tablet 0   sevelamer carbonate (RENVELA) 800 MG tablet Take 1,600-2,400 mg by mouth See admin instructions. Take 2400 mg with each meal and 1600 mg with each snack     TRULICITY 1.5 0000000 SOPN INJECT CONTENTs OF ONE PEN UNDER THE SKIN WEEKLY ON SATURDAY (Patient taking differently: Inject 1.5 mg into the skin once a week. On Saturdays) 2 mL 0   acetaminophen (TYLENOL) 325 MG tablet Take 1-2 tablets (325-650 mg total) by mouth every 4 (four) hours as needed for mild pain.     Insulin Syringe-Needle U-100 28G X 1/2" 1 ML MISC Use with insulin daily Dx E11.22 100 each 3   Misc. Devices (EGG CRATE BED PAD) MISC Use to treat and prevent bed sores 1 each 0   polyethylene glycol (MIRALAX / GLYCOLAX) 17 g packet Take 17 g by mouth daily. (Patient not taking: Reported on 12/07/2020) 14 each 0    Blood pressure (!) 141/65, pulse 72, temperature 99.5 F (37.5 C), temperature source Oral, resp. rate 19, height '5\' 4"'$  (1.626 m), weight 89.8 kg, SpO2 100 %. Physical Exam:  General: pleasant, chronically ill appearing female who is laying in bed in NAD HEENT: head is normocephalic, atraumatic.  Sclera are noninjected.  Pupils equal and round.  Ears and nose without any masses or lesions. Edentulous  Heart: regular, rate, and rhythm.  Normal s1,s2. No obvious murmurs, gallops, or rubs noted.  Lungs: CTAB, no wheezes, rhonchi, or rales noted.  Respiratory effort nonlabored Abd: soft, NT, +BS, no masses, hernias, or organomegaly MS: bilateral BKA with bandages c/d/I. L hand with necrotic changes and eschar Skin: warm and dry. Left hand with necrosis and eschar. Sacral wound with soft fibrinous tissue  centrally and border pink with small amount of bleeding. Area of eschar at left lower margin. Diffusely TTP of wound Right hip wound with eschar Right flank wound with eschar See below Neuro: Cranial nerves 2-12 grossly intact, sensation is normal throughout Psych: A&Ox3 with an appropriate affect.  Sacral wound - 14cmx5cm in greatest dimensions    Right hip   Right flank and back    Results for orders placed or performed during the hospital encounter of 12/03/2020 (from the past 48 hour(s))  Comprehensive metabolic  panel     Status: Abnormal   Collection Time: 12/03/2020 10:21 AM  Result Value Ref Range   Sodium 132 (L) 135 - 145 mmol/L   Potassium 5.8 (H) 3.5 - 5.1 mmol/L   Chloride 94 (L) 98 - 111 mmol/L   CO2 22 22 - 32 mmol/L   Glucose, Bld 126 (H) 70 - 99 mg/dL    Comment: Glucose reference range applies only to samples taken after fasting for at least 8 hours.   BUN 81 (H) 8 - 23 mg/dL   Creatinine, Ser 13.10 (H) 0.44 - 1.00 mg/dL   Calcium 8.1 (L) 8.9 - 10.3 mg/dL   Total Protein 7.2 6.5 - 8.1 g/dL   Albumin 2.1 (L) 3.5 - 5.0 g/dL   AST 12 (L) 15 - 41 U/L   ALT 10 0 - 44 U/L   Alkaline Phosphatase 79 38 - 126 U/L   Total Bilirubin 1.0 0.3 - 1.2 mg/dL   GFR, Estimated 3 (L) >60 mL/min    Comment: (NOTE) Calculated using the CKD-EPI Creatinine Equation (2021)    Anion gap 16 (H) 5 - 15    Comment: Performed at [facility], [address], [place], [place] [address]  CBC with Differential     Status: Abnormal   Collection Time: 12/12/2020 10:21 AM  Result Value Ref Range   WBC 23.6 (H) 4.0 - 10.5 K/uL   RBC 3.52 (L) 3.87 - 5.11 MIL/uL   Hemoglobin 8.3 (L) 12.0 - 15.0 g/dL    Comment: Reticulocyte Hemoglobin testing may be clinically indicated, consider ordering this additional test [id number]    HCT 26.9 (L) 36.0 - 46.0 %   MCV 76.4 (L) 80.0 - 100.0 fL   MCH 23.6 (L) 26.0 - 34.0 pg   MCHC 30.9 30.0 - 36.0 g/dL   RDW 19.5 (H) 11.5 - 15.5 %   Platelets 737 (H) 150  - 400 K/uL   nRBC 0.0 0.0 - 0.2 %   Neutrophils Relative % 86 %   Neutro Abs 20.5 (H) 1.7 - 7.7 K/uL   Lymphocytes Relative 5 %   Lymphs Abs 1.1 0.7 - 4.0 K/uL   Monocytes Relative 5 %   Monocytes Absolute 1.1 (H) 0.1 - 1.0 K/uL   Eosinophils Relative 2 %   Eosinophils Absolute 0.5 0.0 - 0.5 K/uL   Basophils Relative 1 %   Basophils Absolute 0.1 0.0 - 0.1 K/uL   Immature Granulocytes 1 %   Abs Immature Granulocytes 0.28 (H) 0.00 - 0.07 K/uL    Comment: Performed at [facility], [address] [place], [place] [address]  Resp Panel by RT-PCR (Flu A&B, Covid) Nasopharyngeal Swab     Status: None   Collection Time: 12/10/2020 10:26 AM   Specimen: Nasopharyngeal Swab; Nasopharyngeal(NP) swabs in vial transport medium  Result Value Ref Range   SARS Coronavirus 2 by RT PCR NEGATIVE NEGATIVE    Comment: (NOTE) SARS-CoV-2 target nucleic acids are NOT DETECTED.  The SARS-CoV-2 RNA is generally detectable in upper respiratory specimens during the acute phase of infection. The lowest concentration of SARS-CoV-2 viral copies this assay can detect is 138 copies/mL. A negative result does not preclude SARS-Cov-2 infection and should not be used as the sole basis for treatment or other patient management decisions. A negative result may occur with  improper specimen collection/handling, submission of specimen other than nasopharyngeal swab, presence of viral mutation(s) within the areas targeted by this assay, and inadequate number of viral copies(<138 copies/mL). A  negative result must be combined with clinical observations, patient history, and epidemiological information. The expected result is Negative.  Fact Sheet for Patients:  EntrepreneurPulse.com.au  Fact Sheet for Healthcare Providers:  IncredibleEmployment.be  This test is no t yet approved or cleared by the [place] FDA and  has been authorized for detection and/or diagnosis of SARS-CoV-2  by FDA under an Emergency Use Authorization (EUA). This EUA will remain  in effect (meaning this test can be used) for the duration of the COVID-19 declaration under Section 564(b)(1) of the Act, 21 U.S.C.section 360bbb-3(b)(1), unless the authorization is terminated  or revoked sooner.       Influenza A by PCR NEGATIVE NEGATIVE   Influenza B by PCR NEGATIVE NEGATIVE    Comment: (NOTE) The Xpert Xpress SARS-CoV-2/FLU/RSV plus assay is intended as an aid in the diagnosis of influenza from Nasopharyngeal swab specimens and should not be used as a sole basis for treatment. Nasal washings and aspirates are unacceptable for Xpert Xpress SARS-CoV-2/FLU/RSV testing.  Fact Sheet for Patients: EntrepreneurPulse.com.au  Fact Sheet for Healthcare Providers: IncredibleEmployment.be  This test is not yet approved or cleared by the [place] FDA and has been authorized for detection and/or diagnosis of SARS-CoV-2 by FDA under an Emergency Use Authorization (EUA). This EUA will remain in effect (meaning this test can be used) for the duration of the COVID-19 declaration under Section 564(b)(1) of the Act, 21 U.S.C. section 360bbb-3(b)(1), unless the authorization is terminated or revoked.  Performed at [facility], [address], [place], [place] [address]   Blood culture (routine x 2)     Status: None (Preliminary result)   Collection Time: 12/17/2020 10:45 AM   Specimen: BLOOD  Result Value Ref Range   Specimen Description BLOOD LEFT ARM    Special Requests      BOTTLES DRAWN AEROBIC AND ANAEROBIC Blood Culture adequate volume Performed at [facility], [address] [place], [place] [address]    Culture PENDING    Report Status PENDING   Blood culture (routine x 2)     Status: None (Preliminary result)   Collection Time: 12/25/2020 12:42 PM   Specimen: Left Antecubital; Blood  Result Value Ref Range   Specimen Description LEFT ANTECUBITAL     Special Requests      Blood Culture adequate volume BOTTLES DRAWN AEROBIC AND ANAEROBIC Performed at [facility], [address] [place], [place] [address]    Culture PENDING    Report Status PENDING   Renal function panel     Status: Abnormal   Collection Time: 12/21/2020  3:13 PM  Result Value Ref Range   Sodium 130 (L) 135 - 145 mmol/L   Potassium 5.9 (H) 3.5 - 5.1 mmol/L   Chloride 92 (L) 98 - 111 mmol/L   CO2 22 22 - 32 mmol/L   Glucose, Bld 124 (H) 70 - 99 mg/dL    Comment: Glucose reference range applies only to samples taken after fasting for at least 8 hours.   BUN 83 (H) 8 - 23 mg/dL   Creatinine, Ser 13.93 (H) 0.44 - 1.00 mg/dL   Calcium 7.9 (L) 8.9 - 10.3 mg/dL   Phosphorus 9.7 (H) 2.5 - 4.6 mg/dL   Albumin 2.1 (L) 3.5 - 5.0 g/dL   GFR, Estimated 3 (L) >60 mL/min    Comment: (NOTE) Calculated using the CKD-EPI Creatinine Equation (2021)    Anion gap 16 (H) 5 - 15    Comment: Performed at [facility], [address], [place], [place] [address]  CBC     Status: Abnormal   Collection Time: 12/01/2020  3:13 PM  Result Value Ref Range   WBC 24.9 (H) 4.0 - 10.5 K/uL   RBC 3.37 (L) 3.87 - 5.11 MIL/uL   Hemoglobin 8.0 (L) 12.0 - 15.0 g/dL    Comment: Reticulocyte Hemoglobin testing may be clinically indicated, consider ordering this additional test [id number]    HCT 25.9 (L) 36.0 - 46.0 %   MCV 76.9 (L) 80.0 - 100.0 fL   MCH 23.7 (L) 26.0 - 34.0 pg   MCHC 30.9 30.0 - 36.0 g/dL   RDW 19.6 (H) 11.5 - 15.5 %   Platelets 735 (H) 150 - 400 K/uL   nRBC 0.1 0.0 - 0.2 %    Comment: Performed at [facility], [address], [place], [place] [address]  Glucose, capillary     Status: None   Collection Time: 11/28/20  1:31 AM  Result Value Ref Range   Glucose-Capillary 96 70 - 99 mg/dL    Comment: Glucose reference range applies only to samples taken after fasting for at least 8 hours.  MRSA Next Gen by PCR, Nasal     Status: Abnormal   Collection Time: 11/28/20  1:41 AM    Specimen: Nasal Mucosa; Nasal Swab  Result Value Ref Range   MRSA by PCR Next Gen DETECTED (A) NOT DETECTED    Comment: RESULT CALLED TO, READ BACK BY AND VERIFIED WITH: Starr Sinclair RN, AT [phone] 11/28/20 D. VANHOOK (NOTE) The GeneXpert MRSA Assay (FDA approved for NASAL specimens only), is one component of a comprehensive MRSA colonization surveillance program. It is not intended to diagnose MRSA infection nor to guide or monitor treatment for MRSA infections. Test performance is not FDA approved in patients less than 47 years old. Performed at [facility], [place] [address] [place],  [id number]   Basic metabolic panel     Status: Abnormal   Collection Time: 11/28/20  5:32 AM  Result Value Ref Range   Sodium 133 (L) 135 - 145 mmol/L   Potassium 4.5 3.5 - 5.1 mmol/L   Chloride 90 (L) 98 - 111 mmol/L   CO2 27 22 - 32 mmol/L   Glucose, Bld 70 70 - 99 mg/dL    Comment: Glucose reference range applies only to samples taken after fasting for at least 8 hours.   BUN 40 (H) 8 - 23 mg/dL   Creatinine, Ser 8.16 (H) 0.44 - 1.00 mg/dL   Calcium 8.8 (L) 8.9 - 10.3 mg/dL   GFR, Estimated 5 (L) >60 mL/min    Comment: (NOTE) Calculated using the CKD-EPI Creatinine Equation (2021)    Anion gap 16 (H) 5 - 15    Comment: Performed at [facility] [address] [place], [place] [address]  CBC     Status: Abnormal   Collection Time: 11/28/20  5:32 AM  Result Value Ref Range   WBC 22.4 (H) 4.0 - 10.5 K/uL   RBC 3.27 (L) 3.87 - 5.11 MIL/uL   Hemoglobin 7.5 (L) 12.0 - 15.0 g/dL    Comment: Reticulocyte Hemoglobin testing may be clinically indicated, consider ordering this additional test [id number]    HCT 24.7 (L) 36.0 - 46.0 %   MCV 75.5 (L) 80.0 - 100.0 fL   MCH 22.9 (L) 26.0 - 34.0 pg   MCHC 30.4 30.0 - 36.0 g/dL   RDW 19.1 (H) 11.5 - 15.5 %   Platelets  703 (H) 150 - 400 K/uL   nRBC 0.0 0.0 - 0.2 %    Comment: Performed at [facility], [place] [address]  [place], [place] [address]  Magnesium     Status: None   Collection Time: 11/28/20  5:32 AM  Result Value Ref Range   Magnesium 2.0 1.7 - 2.4 mg/dL    Comment: Performed at [facility] [address] [place], [place] [address]  Glucose, capillary     Status: None   Collection Time: 11/28/20  6:39 AM  Result Value Ref Range   Glucose-Capillary 72 70 - 99 mg/dL    Comment: Glucose reference range applies only to samples taken after fasting for at least 8 hours.   DG Chest Portable 1 View  Result Date: 12/20/2020 CLINICAL DATA:  Generalized weakness. EXAM: PORTABLE CHEST 1 VIEW COMPARISON:  Multiple priors, most recent chest x-ray dated November 23, 2020 3: FINDINGS: Cardiac and mediastinal contours are unchanged. Mild bibasilar opacities, likely due to atelectasis. No focal consolidation. No large pleural effusion or evidence of pneumothorax. Vascular stent projecting over the right axilla. Left IJ line, ET tube and enteric tube have been removed when compared with prior x-ray. IMPRESSION: Mild bibasilar opacities, likely due to atelectasis. Electronically Signed   By: Yetta Glassman MD   On: 12/20/2020 11:11      Assessment/Plan Sacral decubitus wound - do not recommend surgical debridement in the OR. She is very tender with blunt palpation of sacral wound and would not tolerate bedside sharp debridement. - recommend PT hydrotherapy and santyl and continued WOC nursing - right flank and right hip wounds could benefit from hydrotherapy/santyl as well -agree with palliative care consult  We will sign off but please do not hesitate to contact us with any questions or concerns or re-consult if needed.   Winferd Humphrey, [facility] 11/28/2020, 1:06 PM Please see Amion for pager number during day hours 7:00am-4:30pm

## 2020-11-28 NOTE — Plan of Care (Signed)
  Problem: Clinical Measurements: Goal: Will remain free from infection Outcome: Progressing   Problem: Activity: Goal: Risk for activity intolerance will decrease Outcome: Progressing   Problem: Nutrition: Goal: Adequate nutrition will be maintained Outcome: Progressing   Problem: Skin Integrity: Goal: Risk for impaired skin integrity will decrease Outcome: Progressing   

## 2020-11-28 NOTE — Progress Notes (Signed)
New Admission Note:   Arrival Method: Arrived from [facility] ED via [place] Orientation: Alert and oriented x3 Telemetry: Box #11 Assessment: Completed Skin: Multiple unstageable pressure injury to sacrum,buttocks,back, L stump,and ischium. Deep tissue injury to breast area and flank area. Ischemic left hand and fingers.Pls see doc flowsheet. IV: NSL-Lt FA Pain: 8/10 Tubes: N/A Safety Measures: Safety Fall Prevention Plan has been discussed.  Admission: Completed 5MW Orientation: Patient has been oriented to the room, unit and staff.  Family: None at bedside  Orders have been reviewed and implemented. Will continue to monitor the patient. Call light has been placed within reach and bed alarm has been activated.   Santanna Whitford [employer], RN-BC Phone number: [phone]

## 2020-11-28 NOTE — H&P (View-Only) (Signed)
Pt transferred from [facility] with left hand gangrene.  This is followed by Dr Grandville Silos.  Ortho eval earlier today continued outpt management.  She also has bilateral BKA's.  Right BKA Dr Donzetta Matters.  Left BKA Dr Stanford Breed.  Left radial artery thrombectomy Dr Stanford Breed.  PE:  Left hand multiple gangrenous fingers Dry eschar over radial incision with no palpable pulse.  Arm is warm at the wrist level.  No ongoing obvious infection.  BKA stumps with eschar bilaterally.  A: non salvageable left hand no further vascular procedures.  BKA stumps with eschar will eventually need debridement Will d/w Dr Stanford Breed on whether he wants to watch these for awhile or debride while in hospital  Pt would definitely benefit from palliative care consult  Ruta Hinds, MD [facility] Office: [phone]

## 2020-11-28 NOTE — Consult Note (Addendum)
[place] Nurse Consult Note: Reason for Consult: Consult requested for multiple wounds.  Pt is familiar to [facility] nurses from previous admission on 7/18 and wounds have greatly declined since that time.  Pt was very reluctant to allow me to turn her and assess the wounds and insisted I I perform the assessment very rapidly related to pain.  I turned and removed as many foam dressings as possible to assess the right hip/ischium, bilat buttocks/sacrum/back, but then she would not allow me to turn her and assess from the left side.  She stated, "that is enough.  I can't take it." Because of this, I was only able to perform a very limited skin assessment  and obtain approximate measurements.  Please refer to nursing wound flowsheet for further details and measurements. Pt has multiple systemic factors which can impair healing of these wounds and a palliative care consult is pending. If aggressive plan of care is desired by the patient and her family, please consult the surgical team for possible debridement of the multiple necrotic wounds to bilat stumps, right hip, buttocks and sacrum. Secure chat message sent to the primary team to inform them of this information.   Vascular team consult is pending for necrotic left hand, according to the primary team, so this location was not assessed. One hours spent performing this consult.  Wound type: Bilat leg stumps with Unstageable pressure injuries; each is 100% dry eschar without drainage Left stump 8X6cm Right stump 3X8cm Right breast fold with dark red purple deep tissue injury; 1X3cm Sacrum Unstageable pressure injury; 13X5cm, 100% slough/eschar, mod amt tan drainage Right back Unstageable pressure injury .8X.8cm, 100% dry eschar Right ischium Unstageable pressure injury, 6X5cm, 100% slough/eschar, mod amt tan drainage Right buttocks Unstageable pressure injury, 6X15cm, 100% slough/eschar, mod amt tan drainage Left inner groin fold with 2 full thickness draining  pinhole openings; mod amt pink drainage Pressure Injury POA: Yes Dressing procedure/placement/frequency: These multiple wounds have very limited potential for healing.  Air mattress ordered to reduce pressure. Topical treatment orders provided for bedside nurses to perform as follows: 1. Apply Santyl to buttocks and sacrum and right hip eschar/slough wounds Q day, then cover with moist gauze and foam dressings.  (Change foam dressings Q 3 days or PRN soiling.) 2. Apply betadine swab and then foam dressings to bilat stumps.  (Change foam dressings Q 3 days or PRN soiling.)  3. Foam dressing to right breast fold wound (Change foam dressings Q 3 days or PRN soiling.) 4. Tuck dry ABD pad into left groin skin fold and change Q day or PRN soiling.  Please re-consult if further assistance is needed.  Thank-you,  Julien Girt MSN, [place], [place], [place], [place]

## 2020-11-28 NOTE — Progress Notes (Signed)
Patient ID: Christy Zhang, female   DOB: 1955-09-23, 65 y.o.   MRN: [id number]   LOS: 1 day   Subjective: No new c/o re: left hand   Objective: Vital signs in last 24 hours: Temp:  [97.9 F (36.6 C)-99.5 F (37.5 C)] 99.5 F (37.5 C) (08/02 1025) Pulse Rate:  [70-88] 72 (08/02 1025) Resp:  [9-25] 19 (08/02 1025) BP: (109-159)/(52-134) 141/65 (08/02 1025) SpO2:  [91 %-100 %] 100 % (08/02 1025) Weight:  [86.2 kg-89.8 kg] 89.8 kg (08/02 0517) Last BM Date: 11/28/20   Laboratory  CBC Recent Labs    12/17/2020 1513 11/28/20 0532  WBC 24.9* 22.4*  HGB 8.0* 7.5*  HCT 25.9* 24.7*  PLT 735* 703*   BMET Recent Labs    12/20/2020 1513 11/28/20 0532  NA 130* 133*  K 5.9* 4.5  CL 92* 90*  CO2 22 27  GLUCOSE 124* 70  BUN 83* 40*  CREATININE 13.93* 8.16*  CALCIUM 7.9* 8.8*     Physical Exam General appearance: alert and no distress Left hand: Ischemic areas continue to evolve, more progress than I was expecting at this point but still too early for intervention. No TTP.   Assessment/Plan: Left hand ischemia -- Plan continues to be OP f/u with Dr. Grandville Silos.    Lisette Abu, PA-C Orthopedic Surgery [phone] 11/28/2020

## 2020-11-28 NOTE — Chronic Care Management (AMB) (Signed)
 Care Management    RN Visit Note  12/01/2020 Name: Christy Zhang MRN: [id number] DOB: 04/02/1956  Subjective: Christy Zhang is a 65 y.o. year old female who is a primary care patient of [name], [place], MD. The care management team was consulted for assistance with disease management and care coordination needs.    Engaged with patient's daughter, Christy Zhang, by telephone  for follow up visit in response to provider referral for case management and/or care coordination services.   Consent to Services:   Christy Zhang was given information about Care Management services today including:  Care Management services includes personalized support from designated clinical staff supervised by her physician, including individualized plan of care and coordination with other care providers 24/7 contact phone numbers for assistance for urgent and routine care needs. The patient may stop case management services at any time by phone call to the office staff.  Patient agreed to services and consent obtained.   Assessment: Review of patient past medical history, allergies, medications, health status, including review of consultants reports, laboratory and other test data, was performed as part of comprehensive evaluation and provision of chronic care management services.   SDOH (Social Determinants of Health) assessments and interventions performed:    Care Plan  Allergies  Allergen Reactions   Sulfa Antibiotics Swelling    Facility-Administered Encounter Medications as of 12/10/2020  Medication   0.9 %  sodium chloride infusion   0.9 %  sodium chloride infusion   0.9 %  sodium chloride infusion   acetaminophen (TYLENOL) tablet 650 mg   Or   acetaminophen (TYLENOL) suppository 650 mg   alteplase (CATHFLO ACTIVASE) injection 2 mg   amiodarone (PACERONE) tablet 200 mg   aspirin EC tablet 81 mg   atorvastatin (LIPITOR) tablet 40 mg   Chlorhexidine Gluconate Cloth 2 % PADS 6 each   Chlorhexidine  Gluconate Cloth 2 % PADS 6 each   collagenase (SANTYL) ointment   [START ON 11/29/2020] Darbepoetin Alfa (ARANESP) injection 150 mcg   diphenhydrAMINE (BENADRYL) capsule 25 mg   [START ON 11/29/2020] doxercalciferol (HECTOROL) injection 1.5 mcg   enoxaparin (LOVENOX) injection 80 mg   gabapentin (NEURONTIN) capsule 300 mg   heparin injection 1,000 Units   heparin injection 1,700 Units   hydrocortisone (ANUSOL-HC) suppository 25 mg   HYDROmorphone (DILAUDID) injection 0.5-1 mg   insulin detemir (LEVEMIR) injection 16 Units   labetalol (NORMODYNE) injection 10 mg   lidocaine (PF) (XYLOCAINE) 1 % injection 5 mL   lidocaine-prilocaine (EMLA) cream 1 application   midodrine (PROAMATINE) tablet 5 mg   mupirocin ointment (BACTROBAN) 2 % 1 application   ondansetron (ZOFRAN) injection 4 mg   ondansetron (ZOFRAN) tablet 4 mg   Or   ondansetron (ZOFRAN) injection 4 mg   oxyCODONE (Oxy IR/ROXICODONE) immediate release tablet 5 mg   pentafluoroprop-tetrafluoroeth (GEBAUERS) aerosol 1 application   polyethylene glycol (MIRALAX / GLYCOLAX) packet 17 g   senna-docusate (Senokot-S) tablet 1 tablet   sevelamer carbonate (RENVELA) tablet 2,400 mg   sodium chloride flush (NS) 0.9 % injection 3 mL   sodium chloride flush (NS) 0.9 % injection 3 mL   Outpatient Encounter Medications as of 12/04/2020  Medication Sig Note   acetaminophen (TYLENOL) 325 MG tablet Take 1-2 tablets (325-650 mg total) by mouth every 4 (four) hours as needed for mild pain.    amiodarone (PACERONE) 200 MG tablet Take 1 tablet (200 mg total) by mouth daily for 19 days.    apixaban (ELIQUIS) 5   MG TABS tablet Take 1 tablet (5 mg total) by mouth 2 (two) times daily.    aspirin EC 81 MG tablet Take 81 mg by mouth at bedtime. Swallow whole.    atorvastatin (LIPITOR) 40 MG tablet TAKE ONE TABLET BY MOUTH DAILY    diphenhydrAMINE (BENADRYL) 25 MG tablet Take 25 mg by mouth every 6 (six) hours as needed for itching.    gabapentin (NEURONTIN)  300 MG capsule Take 1 capsule (300 mg total) by mouth at bedtime.    insulin detemir (LEVEMIR) 100 UNIT/ML injection Inject 0.16 mLs (16 Units total) into the skin daily.    Insulin Syringe-Needle U-100 28G X 1/2" 1 ML MISC Use with insulin daily Dx E11.22    midodrine (PROAMATINE) 5 MG tablet Take 1 tablet (5 mg total) by mouth 3 (three) times daily with meals.    Misc. Devices (EGG CRATE BED PAD) MISC Use to treat and prevent bed sores    oxyCODONE (OXY IR/ROXICODONE) 5 MG immediate release tablet Take 1 tablet (5 mg total) by mouth every 6 (six) hours as needed for severe pain.    polyethylene glycol (MIRALAX / GLYCOLAX) 17 g packet Take 17 g by mouth daily. (Patient not taking: Reported on 12/08/2020)    senna-docusate (SENOKOT-S) 8.6-50 MG tablet Take 1 tablet by mouth 2 (two) times daily.    sevelamer carbonate (RENVELA) 800 MG tablet Take 1,600-2,400 mg by mouth See admin instructions. Take 2400 mg with each meal and 1600 mg with each snack    TRULICITY 1.5 HQ/7.5FF SOPN INJECT CONTENTs OF ONE PEN UNDER THE SKIN WEEKLY ON SATURDAY (Patient taking differently: Inject 1.5 mg into the skin once a week. On Saturdays) 10/27/2020: Missed it sat so she took it on Wed    Patient Active Problem List   Diagnosis Date Noted   ESRD (end stage renal disease) on dialysis ([place]) 11/29/2020   Paroxysmal atrial fibrillation ([place])    Cardiac arrest ([place])    CAP (community acquired pneumonia) 10/27/2020   Altered mental status 10/27/2020   Acute respiratory failure with hypoxemia ([place]) 10/27/2020   Lactic acidosis 10/27/2020   Hypoalbuminemia 10/27/2020   Hyponatremia 10/27/2020   Hyperlipidemia 10/27/2020   S/P BKA (below knee amputation) unilateral, right ([place]) 09/14/2020   Diabetic foot infection ([place])    Medication monitoring encounter    Pressure injury of skin 08/11/2020   Thrombocytosis 08/09/2020   Osteomyelitis of right foot ([place]) 08/08/2020   Leucocytosis 07/05/2020   Below-knee amputation of  left lower extremity ([place]) 06/16/2020   S/P BKA (below knee amputation) unilateral ([place]) 06/08/2020   Ischemic ulcer of toe of right foot ([place]) 04/13/2020   PVD (peripheral vascular disease)/ S/p BKA 04/13/2020   ESRD on hemodialysis ([place]) 01/27/2020   Abdominal wall cellulitis    Panniculitis 12/29/2019   Osteomyelitis ([place]) 10/15/2019   Hyperglycemia due to diabetes mellitus ([place]) 10/15/2019   Osteomyelitis of third toe of right foot ([place]) 10/14/2019   ESRD (end stage renal disease) ([place])    Asthma, mild persistent 07/25/2015   Hypertension secondary to other renal disorders 07/25/2015   Controlled type 2 diabetes mellitus with complication, with long-term current use of insulin ([place]) 03/30/2015   Anemia of chronic disease 02/17/2015   Osteopathy in diseases classified elsewhere, unspecified site 02/17/2015   Essential (primary) hypertension 06/24/2014   Heart failure ([place]) 06/24/2014   Obesity, Class III, BMI 40-49.9 (morbid obesity) ([place]) 06/24/2014    Conditions to be addressed/monitored: HTN, DMII, ESRD, and PVD with  multiple amputations   Care Plan : [facility] Care Plan  Updates made by Ilean China, RN since 11/28/2020 12:00 AM   Problem: Care Coordination and Chronic Disease Management Needs r/t ESRD, DM, HTN, and PVD   Priority: Medium     Goal: Work with RN Care Manager Regarding Care Management and Care Coordination Associated with ESRD, DM, HTN, and PVD   This Visit's Progress: On track  Priority: Medium  Note:   Current Barriers:  Care Coordination needs related to Level of care concerns and ADL IADL limitations in a patient with HTN, DMII, ESRD, and PVD with multiple amputations Chronic Disease Management support and education needs related to HTN, DMII, ESRD, and PVD with multiple amputations  RNCM Clinical Goal(s):  Patient will work with RN Case Manager to address needs related to HTN, DMII, ESRD, and PVD with multiple amputations and Level of care concerns and  ADL IADL limitations through collaboration with RN Care manager, provider, and care team.   Interventions: 1:1 collaboration with primary care provider regarding development and update of comprehensive plan of care as evidenced by provider attestation and co-signature Inter-disciplinary care team collaboration (see longitudinal plan of care)   Diabetes:  (Status: Condition stable. Not addressed this visit.) Lab Results  Component Value Date   HGBA1C 6.4 (H) 10/27/2020  Assessed patient's understanding of A1c goal: <7% Evaluation of current treatment plan related to diabetes and patient's adherence to plan as established by provider. Chart reviewed including relevant office notes and lab results Reinforced need to continue checking and recording blood sugar readings twice a day Discussed home readings and advised to call PCP with any readings below 70 or above 200 Reviewed upcoming appointments Reinforced a healthy low sodium and ADA/carb modified diet Discussed family/social support Has assistance from her adult daughters One daughter just had a baby on 09/06/20 Discussed need for lower extremity amputation and therapuetic listening utilized.  Provided with RN Care manager contact number and encouraged to reach out as needed Reviewed upcoming appointments Encouraged to reach out to LCSW with psychosocial needs  ESRD  (Status: Goal on track: YES.) Lab Results  Component Value Date   CREATININE 8.16 (H) 11/28/2020   BUN 40 (H) 11/28/2020   NA 133 (L) 11/28/2020   K 4.5 11/28/2020   CL 90 (L) 11/28/2020   CO2 27 11/28/2020  CT Abd from 11/02/20 IMPRESSION: 3.5 cm heterogeneously enhancing abnormality is again noted arising from lower pole of left kidney concerning for possible neoplasm. Further evaluation with MRI with and without gadolinium is recommended.  Evaluation of current treatment plan related to ESRD, Level of care concerns and ADL IADL limitations, self-management and  patient's adherence to plan as established by provider. Discussed plans with patient for ongoing care management follow up and provided patient with direct contact information for care management team Evaluation of current treatment plan related to ESRD and patient's adherence to plan as established by provider; Reviewed [facility] ED admission records from today. Patient has missed two dialysis sessions. Was sent to ED from dialysis today via ambulance due to severe pain from sacral wound and inability to sit for dialysis.  Collaborated with patient's daughter, [name], regarding current level of care Therapeutic listening utilized regarding decline in health status and family concerns regarding medical care   Hypertension: (Status: Condition stable. Not addressed this visit.) Last practice recorded BP readings:  BP Readings from Last 3 Encounters:  11/28/20 (!) 141/65  11/24/20 129/60  11/23/20 127/63  Most recent  eGFR/CrCl:  Lab Results  Component Value Date   EGFR 10 (L) 10/18/2020    No components found for: CRCL  Evaluation of current treatment plan related to hypertension and patient's adherence to plan as established by provider. Chart reviewed including relevant office notes and lab results Reviewed recent in-office blood pressure readings. They are consistently high. Discussed home readings Patient doesn't check at home herself but home health nurse is checking twice a week for now and it is checked 3 times a week at dialysis Reviewed and discussed medications Patient reports being compliant with medications and cost isn't a problem as they are free Discussed importance of blood pressure control Recommended DASH diet Provided with RN Care Manager contact number and encouraged to reach out as needed Again discussed results of abd CT scan with daughter, Christy Zhang, and need for MRI to follow-up on enlarging left kidney mass that may be cancerous. Advised that the results of that scan  may impact future medical decisions Previously collaborated with PCP and covering provider regarding need for MRI or urology referral to follow-up on mass Reviewed upcoming appointments: Hematology/Oncology to discuss abnormal blood cell counts on 12/06/20 Collaborated via Secure Message with ED physician, Dr Shah, who agreed to order abdominal MRI to be done once transferred to [facility] from [place]   PVD  (Status: New goal.) Evaluation of current treatment plan related to  peripheral vascular disease and multiple amputations , Level of care concerns and ADL IADL limitations self-management and patient's adherence to plan as established by provider. Discussed plans with patient for ongoing care management follow up and provided patient with direct contact information for care management team Talked with patient's daughter, Christy Zhang, via telephone regarding current medical status Reviewed ED admission notes and recommendation to transfer from [place] to [facility] for vascular consult due to gangrenous left hand and right BKA stump necrosis and lack of blood flow. Will have consult for potential left hand amputation and revision of right BKA to an AKA Discussed family support from her two daughters Discussed ability to perform ADLs  Patient Goals/Self-Care Activities: Patient will self administer medications as prescribed Patient will attend all scheduled provider appointments Patient will call provider office for new concerns or questions       Plan: The patient has been provided with contact information for the care management team and has been advised to call with any health related questions or concerns.  and RNCM will follow-up with patient/daughter within 7 days of hospital discharge.    , BSN, RN-BC Embedded Chronic Care Manager [facility] Care Management Direct Dial: [phone]            

## 2020-11-29 ENCOUNTER — Ambulatory Visit: Payer: Medicare Other | Admitting: Family Medicine

## 2020-11-29 DIAGNOSIS — Z89512 Acquired absence of left leg below knee: Secondary | ICD-10-CM

## 2020-11-29 DIAGNOSIS — I480 Paroxysmal atrial fibrillation: Secondary | ICD-10-CM | POA: Diagnosis not present

## 2020-11-29 DIAGNOSIS — N186 End stage renal disease: Secondary | ICD-10-CM | POA: Diagnosis not present

## 2020-11-29 DIAGNOSIS — I96 Gangrene, not elsewhere classified: Secondary | ICD-10-CM

## 2020-11-29 DIAGNOSIS — Z89511 Acquired absence of right leg below knee: Secondary | ICD-10-CM

## 2020-11-29 LAB — CBC WITH DIFFERENTIAL/PLATELET
Abs Immature Granulocytes: 0.34 10*3/uL — ABNORMAL HIGH (ref 0.00–0.07)
Basophils Absolute: 0.1 10*3/uL (ref 0.0–0.1)
Basophils Relative: 0 %
Eosinophils Absolute: 0.5 10*3/uL (ref 0.0–0.5)
Eosinophils Relative: 2 %
HCT: 22.2 % — ABNORMAL LOW (ref 36.0–46.0)
Hemoglobin: 7 g/dL — ABNORMAL LOW (ref 12.0–15.0)
Immature Granulocytes: 1 %
Lymphocytes Relative: 5 %
Lymphs Abs: 1.2 10*3/uL (ref 0.7–4.0)
MCH: 23.8 pg — ABNORMAL LOW (ref 26.0–34.0)
MCHC: 31.5 g/dL (ref 30.0–36.0)
MCV: 75.5 fL — ABNORMAL LOW (ref 80.0–100.0)
Monocytes Absolute: 1.4 10*3/uL — ABNORMAL HIGH (ref 0.1–1.0)
Monocytes Relative: 6 %
Neutro Abs: 22.3 10*3/uL — ABNORMAL HIGH (ref 1.7–7.7)
Neutrophils Relative %: 86 %
Platelets: 682 10*3/uL — ABNORMAL HIGH (ref 150–400)
RBC: 2.94 MIL/uL — ABNORMAL LOW (ref 3.87–5.11)
RDW: 19 % — ABNORMAL HIGH (ref 11.5–15.5)
Smear Review: NORMAL
WBC: 25.8 10*3/uL — ABNORMAL HIGH (ref 4.0–10.5)
nRBC: 0 % (ref 0.0–0.2)

## 2020-11-29 LAB — RENAL FUNCTION PANEL
Albumin: 1.8 g/dL — ABNORMAL LOW (ref 3.5–5.0)
Anion gap: 15 (ref 5–15)
BUN: 54 mg/dL — ABNORMAL HIGH (ref 8–23)
CO2: 24 mmol/L (ref 22–32)
Calcium: 8 mg/dL — ABNORMAL LOW (ref 8.9–10.3)
Chloride: 92 mmol/L — ABNORMAL LOW (ref 98–111)
Creatinine, Ser: 9.71 mg/dL — ABNORMAL HIGH (ref 0.44–1.00)
GFR, Estimated: 4 mL/min — ABNORMAL LOW (ref >60)
Glucose, Bld: 120 mg/dL — ABNORMAL HIGH (ref 70–99)
Phosphorus: 6.4 mg/dL — ABNORMAL HIGH (ref 2.5–4.6)
Potassium: 4.3 mmol/L (ref 3.5–5.1)
Sodium: 131 mmol/L — ABNORMAL LOW (ref 135–145)

## 2020-11-29 LAB — CULTURE, BLOOD (ROUTINE X 2)
Culture: NO GROWTH
Special Requests: ADEQUATE

## 2020-11-29 LAB — GLUCOSE, CAPILLARY
Glucose-Capillary: 153 mg/dL — ABNORMAL HIGH (ref 70–99)
Glucose-Capillary: 148 mg/dL — ABNORMAL HIGH (ref 70–99)

## 2020-11-29 MED ORDER — RENA-VITE PO TABS
1.0000 | ORAL_TABLET | Freq: Every day | ORAL | Status: DC
  Administered 2020-12-02 – 2020-12-16 (×14): 1 via ORAL
  Filled 2020-11-29 (×18): qty 1

## 2020-11-29 MED ORDER — NEPRO/CARBSTEADY PO LIQD
237.0000 mL | Freq: Two times a day (BID) | ORAL | Status: DC
  Administered 2020-11-29 – 2020-12-13 (×17): 237 mL via ORAL

## 2020-11-29 MED ORDER — HYDROMORPHONE HCL 1 MG/ML IJ SOLN
INTRAMUSCULAR | Status: AC
  Administered 2020-11-29: 1 mg via INTRAVENOUS
  Filled 2020-11-29: qty 1

## 2020-11-29 MED ORDER — PROSOURCE PLUS PO LIQD
30.0000 mL | Freq: Two times a day (BID) | ORAL | Status: DC
  Administered 2020-11-29 – 2020-12-13 (×18): 30 mL via ORAL
  Filled 2020-11-29 (×22): qty 30

## 2020-11-29 NOTE — Progress Notes (Signed)
Patient refused assessment of VS at this time.

## 2020-11-29 NOTE — Progress Notes (Addendum)
[facility] [place] PROGRESS NOTE  ASSESSMENT / PLAN: Christy Zhang is a 65 y.o. female with dry gangrene of bilateral below knee amputation stumps and the left hand.  Terrible situation. The only therapy I can offer her is bilateral above knee amputation. I had a long and frank discussion with the family.  I think pursuing comfort measures is the best option for her.  I counseled the family that bilateral above-knee amputations would not meaningfully extend her life.  They still wish to pursue aggressive measures at this time.  We will await formal palliative care evaluation.  Following along.  SUBJECTIVE: Sleepy.  Eating with assistance of family members.  Long discussion with family.  OBJECTIVE: BP (!) 105/44   Pulse 79   Temp 99.1 F (37.3 C) (Oral)   Resp 18   Ht '5\' 4"'$  (1.626 m)   Wt 89.8 kg   LMP  (LMP Unknown)   SpO2 97%   BMI 33.98 kg/m   Intake/Output Summary (Last 24 hours) at 11/29/2020 1535 Last data filed at 11/29/2020 0604 Gross per 24 hour  Intake 0 ml  Output 0 ml  Net 0 ml    Somnolent, but in no distress Right upper extremity AV fistula bandaged with good thrill Bilateral below-knee amputation stumps with dry gangrene Left hand with progressive gangrenous change since my last evaluation CBC Latest Ref Rng & Units 11/29/2020 11/28/2020 12/27/2020  WBC 4.0 - 10.5 K/uL 25.8(H) 22.4(H) 24.9(H)  Hemoglobin 12.0 - 15.0 g/dL 7.0(L) 7.5(L) 8.0(L)  Hematocrit 36.0 - 46.0 % 22.2(L) 24.7(L) 25.9(L)  Platelets 150 - 400 K/uL 682(H) 703(H) 735(H)     CMP Latest Ref Rng & Units 11/29/2020 11/28/2020 12/17/2020  Glucose 70 - 99 mg/dL 120(H) 70 124(H)  BUN 8 - 23 mg/dL 54(H) 40(H) 83(H)  Creatinine 0.44 - 1.00 mg/dL 9.71(H) 8.16(H) 13.93(H)  Sodium 135 - 145 mmol/L 131(L) 133(L) 130(L)  Potassium 3.5 - 5.1 mmol/L 4.3 4.5 5.9(H)  Chloride 98 - 111 mmol/L 92(L) 90(L) 92(L)  CO2 22 - 32 mmol/L '24 27 22  '$ Calcium 8.9 - 10.3 mg/dL 8.0(L) 8.8(L) 7.9(L)  Total  Protein 6.5 - 8.1 g/dL - - -  Total Bilirubin 0.3 - 1.2 mg/dL - - -  Alkaline Phos 38 - 126 U/L - - -  AST 15 - 41 U/L - - -  ALT 0 - 44 U/L - - -    Estimated Creatinine Clearance: 6.4 mL/min (A) (by C-G formula based on SCr of 9.71 mg/dL (H)).  Yevonne Aline. Stanford Breed, MD [facility] Phone Number: [phone] 11/29/2020 3:35 PM
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
## 2020-11-29 NOTE — Plan of Care (Signed)
?  Problem: Clinical Measurements: ?Goal: Will remain free from infection ?Outcome: Progressing ?  ?Problem: Nutrition: ?Goal: Adequate nutrition will be maintained ?Outcome: Progressing ?  ?Problem: Pain Managment: ?Goal: General experience of comfort will improve ?Outcome: Progressing ?  ?

## 2020-11-29 NOTE — Consult Note (Signed)
Palliative Care Consult Note                                  Date: 11/29/2020   Patient Name: Christy Zhang  DOB: 1955/10/25  MRN: [id number]  Age / Sex: 65 y.o., female  PCP: Claretta Fraise, MD Referring Physician: Debbe Odea, MD  Reason for Consultation: Establishing goals of care  HPI/Patient Profile: 65 y.o. female  with past medical history of bilateral BKA, ESRD on dialysis on Monday/Wednesday/Friday, type 2 diabetes, hypertension, peripheral vascular disease, hyperlipidemia, recently discharged from here on 11/14/2020 after prolonged hospitalization when she was admitted with altered mental status and had to be intubated for airway protection and also underwent CRRT in the setting of septic shock from pneumonia causing acute respiratory failure with hypoxia, AKI, left radial occlusion s/p surgical revascularization. She was admitted on 12/13/2020 with painful sacral area due to decubitus unable to sit/tolerate for HD and subsequently missed 2 sessions of dialysis.   During admission noted dry gangrene of bilateral BKAs and left hand (from previous radial occlusion). Hand deemed gangrenous and non-salvageable. Only available treatment is bilateral AKA, which [facility] informed the family that would not extend her life. They expressed desire for aggressive measures. General surgery saw the patient for sacral decubitus and does NOT recommend debridement in the OR, rather hydrotherapy/santyl and continued [facility] nursing.  Past Medical History:  Diagnosis Date   Anemia    Arthritis    Asthma    in the past   CHF (congestive heart failure) ([place])    Diabetes mellitus with complication ([place])    Type 2   End stage renal disease on dialysis ([place])    Gangrene ([place])    Hyperlipidemia    Hypertension     Social History   Socioeconomic History   Marital status: Widowed    Spouse name: Not on file   Number of children: Not on file   Years of  education: Not on file   Highest education level: Not on file  Occupational History   Not on file  Tobacco Use   Smoking status: Former    Types: Cigarettes    Quit date: 07/25/1984    Years since quitting: 36.3   Smokeless tobacco: Never  Vaping Use   Vaping Use: Never used  Substance and Sexual Activity   Alcohol use: No    Alcohol/week: 0.0 standard drinks   Drug use: No   Sexual activity: Not on file  Other Topics Concern   Not on file  Social History Narrative   Not on file   Social Determinants of Health   Financial Resource Strain: Low Risk    Difficulty of Paying Living Expenses: Not very hard  Food Insecurity: No Food Insecurity   Worried About Running Out of Food in the Last Year: Never true   [place] in the Last Year: Never true  Transportation Needs: No Transportation Needs   Lack of Transportation (Medical): No   Lack of Transportation (Non-Medical): No  Physical Activity: Inactive   Days of Exercise per Week: 0 days   Minutes of Exercise per Session: 0 min  Stress: No Stress Concern Present   Feeling of Stress : Only a little  Social Connections: Socially Isolated   Frequency of Communication with Friends and Family: More than three times a week   Frequency of Social Gatherings with Friends and Family: More than  three times a week   Attends Religious Services: Never   Active Member of Clubs or Organizations: No   Attends Archivist Meetings: Never   Marital Status: Widowed    Family History  Problem Relation Age of Onset   Diabetes Mother    Hypertension Mother    Cancer Father    Diabetes Father    Hypertension Sister    Diabetes Daughter    Heart disease Daughter    Hypertension Daughter    Diabetes Son    Heart disease Son    Hypertension Son    Peripheral vascular disease Son        amputation    Subjective:   This NP Walden Field reviewed medical records, received report from team, assessed the patient and then meet at  the patient's bedside  to discuss diagnosis, prognosis, GOC, EOL wishes disposition and options.   Concept of Palliative Care was introduced as specialized medical care for people and their families living with serious illness.  If focuses on providing relief from the symptoms and stress of a serious illness.  The goal is to improve quality of life for both the patient and the family. Values and goals of care important to patient and family were attempted to be elicited.  Created space and opportunity for patient  and family to explore thoughts and feelings regarding current medical situation   Natural trajectory and expectations at EOL were discussed. Questions and concerns addressed. Patient  encouraged to call with questions or concerns.    Life Review: We completed a life review of the patient to get know her better.  The patient worked outside of the home at the [employer] in the [employer].  She retired in approximately 2011.  She has 2 daughters.  Her husband has already passed away.  She also has 3 grandchildren and 4 great-grandchildren, to whom she is affectionately known as "big nanny".  She also has 4 sisters and 1 brother who constantly are checking on her.  Her mother is still alive, but has dementia and Parkinson's disease.  In her free time she enjoys completing jigsaw puzzles and word searches.  She does enjoy cooking and has a reputation for her potato salad, candied apples, and banana pudding.  She also really enjoys "slushy's".  Patient Values: Her primary and main thing of importance is her family. She has a loving family and loves spending time with them.  Patient/Family Understanding of Illness: The patient and family understand that her bilateral BKAs will likely need further amputation, which is something that has been developing since prior to her last admission. They also understand that she'll likely need a hand amputation and wound care. We had a  discussion about the "spot" on her kidney that has some concern for carcinoma, although her daughter states "I don't believe that." They are wanting the previously ordered MRI as soon as feesible to answer questions about this.  Review of Systems  Constitutional:  Negative for appetite change and fever.  Respiratory:  Negative for cough, chest tightness and shortness of breath.   Gastrointestinal:  Negative for abdominal distention and abdominal pain.  Musculoskeletal:  Positive for back pain (near sacral decubitus).  Skin:  Positive for color change (bilateral stumps and left hand).   Objective:   Primary Diagnoses: Present on Admission:  Obesity, Class III, BMI 40-49.9 (morbid obesity) (HCC)  Paroxysmal atrial fibrillation (HCC)   Scheduled Meds:  (feeding supplement) PROSource Plus  30 mL Oral BID BM   amiodarone  200 mg Oral Daily   aspirin EC  81 mg Oral QHS   atorvastatin  40 mg Oral QPM   Chlorhexidine Gluconate Cloth  6 each Topical Q0600   Chlorhexidine Gluconate Cloth  6 each Topical Q0600   collagenase   Topical Daily   darbepoetin (ARANESP) injection - DIALYSIS  150 mcg Intravenous Q Wed-HD   doxercalciferol  1.5 mcg Intravenous Q M,W,F-HD   enoxaparin (LOVENOX) injection  80 mg Subcutaneous Q24H   feeding supplement (NEPRO CARB STEADY)  237 mL Oral BID BM   gabapentin  300 mg Oral QHS   hydrocortisone  25 mg Rectal BID   insulin detemir  16 Units Subcutaneous QHS   midodrine  5 mg Oral TID WC   multivitamin  1 tablet Oral QHS   mupirocin ointment  1 application Nasal BID   ondansetron (ZOFRAN) IV  4 mg Intravenous Once   polyethylene glycol  17 g Oral Daily   senna-docusate  1 tablet Oral BID   sevelamer carbonate  2,400 mg Oral TID with meals   sodium chloride flush  3 mL Intravenous Q12H    Continuous Infusions:  sodium chloride     sodium chloride     sodium chloride      PRN Meds: sodium chloride, sodium chloride, sodium chloride, acetaminophen **OR**  acetaminophen, alteplase, diphenhydrAMINE, heparin, heparin, HYDROmorphone (DILAUDID) injection, labetalol, lidocaine (PF), lidocaine-prilocaine, ondansetron **OR** ondansetron (ZOFRAN) IV, oxyCODONE, pentafluoroprop-tetrafluoroeth, sodium chloride flush  Allergies  Allergen Reactions   Sulfa Antibiotics Swelling    Physical Exam Vitals and nursing note reviewed.  Constitutional:      General: She is not in acute distress.    Appearance: She is obese. She is ill-appearing.  HENT:     Head: Normocephalic and atraumatic.  Pulmonary:     Effort: No respiratory distress.  Abdominal:     General: Abdomen is protuberant.  Skin:    General: Skin is warm and dry.       Neurological:     Mental Status: She is alert.    Vital Signs:  BP (!) 147/53   Pulse 79   Temp 98.6 F (37 C)   Resp 18   Ht '5\' 4"'$  (1.626 m)   Wt 89.8 kg   LMP  (LMP Unknown)   SpO2 93%   BMI 33.98 kg/m  Pain Scale: 0-10   Pain Score: 3   SpO2: SpO2: 93 % O2 Device:SpO2: 93 % O2 Flow Rate: .O2 Flow Rate (L/min): 2 L/min  IO: Intake/output summary:  Intake/Output Summary (Last 24 hours) at 11/29/2020 1842 Last data filed at 11/29/2020 1523 Gross per 24 hour  Intake 240 ml  Output 2320 ml  Net -2080 ml    LBM: Last BM Date: (P) 11/28/20 Baseline Weight: Weight: 86.2 kg Most recent weight: Weight:  (Unable to obtain bed weight)      Palliative Assessment/Data: 50%   Advanced Care Planning:   Primary Decision Maker: PATIENT  Code Status/Advance Care Planning: Full code  A discussion was had today regarding advanced directives. Concepts specific to code status, artifical feeding and hydration, continued IV antibiotics and rehospitalization was had.  The difference between a aggressive medical intervention path and a palliative comfort care path for this patient at this time was had.   Decisions/Changes to ACP: Patient expressed wish to be FULL CODE on 3 occasions during the visit Wants all  available medical treatments at this time  Assessment & Plan:   I have reviewed the medical record, interviewed the patient and family, and examined the patient. The following aspects are pertinent.  Impression: 65 year old female with recent admission for severe illness as detailed above now admitted with sacral pain due to sacral decubitus unable to tolerate sitting for hemodialysis and subsequently missing 2 sessions.  She is a known severe vasculopath.  She has a history of bilateral BKA's which are now presenting with dry gangrene as well as a gangrenous left hand which is not salvageable.  Recommendations for bilateral AKA's, which surgery has made clear to the patient and her family would not prolong her life.  They have recommended comfort care.  However, the patient and her family desire aggressive measures at this point.  They are requesting to change CODE STATUS to full code.  There is also some concern for possible renal cell carcinoma noted on CT, awaiting MRI completion for further information.  SUMMARY OF RECOMMENDATIONS   Changed to full code per patient and family wishes Message sent to primary team about request for MRI as soon as feasible Will continue to follow for any changes in goals  Symptom Management:  Per primary team at this time Palliative team is available as needed for symptom management assistance  Palliative Prophylaxis:  Frequent Pain Assessment  Additional Recommendations (Limitations, Scope, Preferences): Full Scope Treatment  Psycho-social/Spiritual:  Desire for further Chaplaincy support: no Additional Recommendations: Caregiving  Support/Resources  Prognosis:  Unable to determine  Discharge Planning:  To Be Determined   Discussed with: Nursing staff, Dr. Wynelle Cleveland, patient, family    Thank you for allowing [place] to participate in the care of [place] [facility] will continue to support holistically.  Time In: 12:00 pm Time Out: 1:15 pm Time  Total: 75 min  Greater than 50%  of this time was spent counseling and coordinating care related to the above assessment and plan.  Signed by: Walden Field, NP Palliative Medicine Team  Team Phone # [phone] (Nights/Weekends)  11/29/2020, 6:42 PM

## 2020-11-29 NOTE — Progress Notes (Signed)
Physical Therapy Wound Evaluation/Treatment Patient Details  Name: Christy Zhang MRN: [id number] Date of Birth: October 07, 1955  Today's Date: 11/29/2020 Time: 3662-9476 Time Calculation (min): 68 min  Subjective  Subjective Assessment Subjective: Pt pleasant and agreeable to hydrotherapy - asking therapists to go slow when moving her Patient and Family Stated Goals: None stated Date of Onset:  (Unknown) Prior Treatments:  (Dressing changes)  Pain Score:  Premedicated and was moderately painful during positioning however tolerated treatment well with minimal complaints of pain.  Wound Assessment  Pressure Injury 11/28/20 Sacrum Unstageable - Full thickness tissue loss in which the base of the injury is covered by slough (yellow, tan, gray, green or brown) and/or eschar (tan, brown or black) in the wound bed. (Active)  Wound Image   11/29/20 1513  Dressing Type ABD;Barrier Film (skin prep);Gauze (Comment);Moist to moist;Santyl 11/29/20 1513  Dressing Changed;Clean;Dry;Intact 11/29/20 1513  Dressing Change Frequency Daily 11/29/20 1513  State of Healing Eschar 11/29/20 1513  Site / Wound Assessment Pink;Yellow;Black 11/29/20 1513  % Wound base Red or Granulating 25% 11/29/20 1513  % Wound base Yellow/Fibrinous Exudate 25% 11/29/20 1513  % Wound base Black/Eschar 50% 11/29/20 1513  % Wound base Other/Granulation Tissue (Comment) 0% 11/29/20 1513  Peri-wound Assessment Intact 11/29/20 1513  Wound Length (cm) 14 cm 11/29/20 1346  Wound Width (cm) 9 cm 11/29/20 1346  Wound Depth (cm) 1.8 cm 11/29/20 1346  Wound Surface Area (cm^2) 126 cm^2 11/29/20 1346  Wound Volume (cm^3) 226.8 cm^3 11/29/20 1346  Tunneling (cm) 0 11/29/20 1513  Undermining (cm) 0 11/29/20 1513  Margins Unattached edges (unapproximated) 11/29/20 1513  Drainage Amount Moderate 11/29/20 1513  Drainage Description Purulent 11/29/20 1513  Treatment Debridement (Selective);Hydrotherapy (Pulse lavage);Packing (Saline gauze)  11/29/20 1513  Pressure Injury 11/28/20 Flank Right Unstageable - Full thickness tissue loss in which the base of the injury is covered by slough (yellow, tan, gray, green or brown) and/or eschar (tan, brown or black) in the wound bed. (Active)  Wound Image   11/29/20 1513  Dressing Type ABD;Barrier Film (skin prep);Gauze (Comment);Moist to moist;Santyl 11/29/20 1513  Dressing Changed;Clean;Dry;Intact 11/29/20 1513  Dressing Change Frequency Daily 11/29/20 1513  State of Healing Eschar 11/29/20 1513  Site / Wound Assessment Pink;Black 11/29/20 1513  % Wound base Red or Granulating 10% 11/29/20 1513  % Wound base Yellow/Fibrinous Exudate 0% 11/29/20 1513  % Wound base Black/Eschar 90% 11/29/20 1513  % Wound base Other/Granulation Tissue (Comment) 0% 11/29/20 1513  Peri-wound Assessment Intact 11/29/20 1513  Wound Length (cm) 7 cm 11/29/20 1346  Wound Width (cm) 18 cm 11/29/20 1346  Wound Depth (cm) 0.1 cm 11/29/20 1346  Wound Surface Area (cm^2) 126 cm^2 11/29/20 1346  Wound Volume (cm^3) 12.6 cm^3 11/29/20 1346  Tunneling (cm) 0 11/29/20 1513  Undermining (cm) 0 11/29/20 1513  Margins Unattached edges (unapproximated) 11/29/20 1513  Drainage Amount Minimal 11/29/20 1513  Drainage Description Purulent 11/29/20 1513  Treatment Debridement (Selective);Hydrotherapy (Pulse lavage);Packing (Saline gauze) 11/29/20 1513     Pressure Injury 11/28/20 Hip Right;Lateral Unstageable - Full thickness tissue loss in which the base of the injury is covered by slough (yellow, tan, gray, green or brown) and/or eschar (tan, brown or black) in the wound bed. (Active)  Wound Image   11/29/20 1513  Dressing Type ABD;Barrier Film (skin prep);Gauze (Comment);Moist to moist;Santyl 11/29/20 1513  Dressing Changed;Clean;Dry;Intact 11/29/20 1513  Dressing Change Frequency Daily 11/29/20 1513  State of Healing Eschar 11/29/20 1513  Site / Wound Assessment Black 11/29/20 1513  %  Wound base Red or Granulating 0%  11/29/20 1513  % Wound base Yellow/Fibrinous Exudate 0% 11/29/20 1513  % Wound base Black/Eschar 100% 11/29/20 1513  % Wound base Other/Granulation Tissue (Comment) 0% 11/29/20 1513  Peri-wound Assessment Intact 11/29/20 1513  Wound Length (cm) 5 cm 11/29/20 1346  Wound Width (cm) 4.5 cm 11/29/20 1346  Wound Depth (cm) 0.1 cm 11/29/20 1346  Wound Surface Area (cm^2) 22.5 cm^2 11/29/20 1346  Wound Volume (cm^3) 2.25 cm^3 11/29/20 1346  Tunneling (cm) 0 11/29/20 1513  Undermining (cm) 0 11/29/20 1513  Margins Unattached edges (unapproximated) 11/29/20 1513  Drainage Amount Minimal 11/29/20 1513  Drainage Description Purulent 11/29/20 1513  Treatment Debridement (Selective);Hydrotherapy (Pulse lavage);Packing (Saline gauze) 11/29/20 1513      Hydrotherapy Pulsed lavage therapy - wound location: Sacrum, R hip, and R flank Pulsed Lavage with Suction (psi): 4 psi Pulsed Lavage with Suction - Normal Saline Used: 2000 mL Pulsed Lavage Tip: Tip with splash shield Selective Debridement Selective Debridement - Location: Sacrum, R hip, and R flank Selective Debridement - Tools Used: Forceps, Scalpel, Scissors Selective Debridement - Tissue Removed: Black eschar    Wound Assessment and Plan  Wound Therapy - Assess/Plan/Recommendations Wound Therapy - Clinical Statement: Pt presents to hydrotherapy with several unstagable wounds to the sacrum, R flank, and R hip. A cluster of wounds on the R flank considered together for measurements and approximations of percentages of necrotic tissue. Daughter and sister were updated after session and they would like to see patient's wounds. This patient will benefit from continued hydrotherapy for selective removal of unviable tissue, to decrease bioburden, and promote wound bed healing. Wound Therapy - Functional Problem List: Decreased tolerance for position changes and OOB Factors Delaying/Impairing Wound Healing: Diabetes Mellitus, Immobility, Multiple  medical problems Hydrotherapy Plan: Debridement, Dressing change, Patient/family education, Pulsatile lavage with suction Wound Therapy - Frequency: 6X / week Wound Therapy - Follow Up Recommendations: dressing changes by RN  Wound Therapy Goals- Improve the function of patient's integumentary system by progressing the wound(s) through the phases of wound healing (inflammation - proliferation - remodeling) by: Wound Therapy Goals - Improve the function of patient's integumentary system by progressing the wound(s) through the phases of wound healing by: Decrease Necrotic Tissue to: 20% Decrease Necrotic Tissue - Progress: Goal set today Increase Granulation Tissue to: 80% Increase Granulation Tissue - Progress: Goal set today Goals/treatment plan/discharge plan were made with and agreed upon by patient/family: Yes Time For Goal Achievement: 7 days Wound Therapy - Potential for Goals: Good  Goals will be updated until maximal potential achieved or discharge criteria met.  Discharge criteria: when goals achieved, discharge from hospital, MD decision/surgical intervention, no progress towards goals, refusal/missing three consecutive treatments without notification or medical reason.  GP     Charges PT Wound Care Charges $Wound Debridement up to 20 cm: < or equal to 20 cm $ Wound Debridement each add'l 20 sqcm: 9 $PT PLS Gun and Tip: 1 Supply $PT Hydrotherapy Visit: 2 Visits       Thelma Comp 11/29/2020, 3:35 PM  Rolinda Roan, PT, DPT Acute Rehabilitation Services Pager: [phone] Office: [phone]

## 2020-11-29 NOTE — Progress Notes (Signed)
   I met briefly with the patient in the hemodialysis unit.  She has about 1-1/2 hours remaining on her current treatment.  She states she is not having much pain other than her sacral wound area.  She has seen surgical consult and vascular consult.  Overall felt hand is nonsalvageable.  She is oriented, awake, alert after receiving hemodialysis.  She does agree that she wants her daughters to be proxy healthcare decision-maker should she be unable to make her decisions.  Due to the noise in the dialysis unit we agreed to meet again later this afternoon for goals of care discussion.  Further official consult note to follow this meeting.   Walden Field, NP Palliative Medicine Team  Team Phone # [phone] (Nights/Weekends)  11/29/2020, 10:40 AM

## 2020-11-29 NOTE — Progress Notes (Addendum)
PROGRESS NOTE    Christy Zhang   [name]  DOB: Jun 28, 1955  DOA: 12/02/2020 PCP: Claretta Fraise, MD   Brief Narrative:  Christy Zhang is a 65 old female with history of bilateral BKA, ESRD on dialysis on Monday/Wednesday/Friday, type 2 diabetes, hypertension, peripheral vascular disease, hyperlipidemia, recently discharged from here on 11/14/2020 after prolonged hospitalization when she was admitted with altered mental status and had to be intubated for airway protection and also underwent CRRT in the setting of septic shock from pneumonia causing acute respiratory failure with hypoxia, AKI.  She presented at [facility] hospital ED because it was too painful to sit down for dialysis due to her decubitus ulcer and she also missed 2 session of dialysis.  Patient was sent to [facility] from [facility] for further management.  Nephrology,orthopedics,palliative care vascular surgery consulted.   Subjective: No new complaints.     Assessment & Plan:   Principal Problem:   Gangrene of left hand - s/p thrombectomy of left radial artery thrombotic occlusion   Active Problems: S/P BKA (below knee amputation) bilateral (HCC) Wound on b/l stumps- - vascular surgery recommending b/l AKA  Sacral and right hip decubitus ulcers - general surgery does not recommend debridement but has recommended hydrotherapy and santyl     Obesity, Class III, BMI 40-49.9 (morbid obesity) (HCC)     Paroxysmal atrial fibrillation (HCC) - cont amiodarone- eliquis on hold    ESRD (end stage renal disease) on dialysis (HCC)  AOCD - appreciate nephro f/u  Left renal mass - MRI abdomen is pending  DM2 - cont Levemir  Time spent in minutes: 35 DVT prophylaxis: holding Code Status: partial code Family Communication:  Level of Care: Level of care: Telemetry Medical Disposition Plan:  Status is: Inpatient  Remains inpatient appropriate because:IV treatments appropriate due to intensity of illness or  inability to take PO  Dispo: The patient is from: Home              Anticipated d/c is to:  TBD              Patient currently is not medically stable to d/c.   Difficult to place patient No      Consultants:  Nephrology Gen surgery Vascular surgery Procedures:   Antimicrobials:  Anti-infectives (From admission, onward)    None        Objective: Vitals:   11/29/20 1030 11/29/20 1100 11/29/20 1130 11/29/20 1321  BP: 113/81 (!) 112/43 (!) 122/54 (!) 105/44  Pulse: 76 76 78 79  Resp: '15 16 16 18  '$ Temp:    99.1 F (37.3 C)  TempSrc:    Oral  SpO2:    97%  Weight:      Height:        Intake/Output Summary (Last 24 hours) at 11/29/2020 1722 Last data filed at 11/29/2020 1523 Gross per 24 hour  Intake 240 ml  Output 0 ml  Net 240 ml   Filed Weights   12/21/2020 1001 12/09/2020 1720 11/28/20 0517  Weight: 86.2 kg 86.2 kg 89.8 kg    Examination: General exam: Appears comfortable  HEENT: PERRLA, oral mucosa moist, no sclera icterus or thrush Respiratory system: Clear to auscultation. Respiratory effort normal. Cardiovascular system: S1 & S2 heard, RRR.   Gastrointestinal system: Abdomen soft, non-tender, nondistended. Normal bowel sounds. Central nervous system: Alert and oriented. No focal neurological deficits. Extremities: No cyanosis, clubbing or edema- b/l BKA with wounds, left hand gangrene noted Psychiatry:  Mood &  affect appropriate.     Data Reviewed: I have personally reviewed following labs and imaging studies  CBC: Recent Labs  Lab 11/23/20 2221 12/26/2020 1021 12/01/2020 1513 11/28/20 0532 11/29/20 0927  WBC 22.8* 23.6* 24.9* 22.4* 25.8*  NEUTROABS 18.6* 20.5*  --   --  22.3*  HGB 7.9* 8.3* 8.0* 7.5* 7.0*  HCT 25.2* 26.9* 25.9* 24.7* 22.2*  MCV 78.5* 76.4* 76.9* 75.5* 75.5*  PLT 770* 737* 735* 703* [id number]*   Basic Metabolic Panel: Recent Labs  Lab 11/23/20 2221 12/02/2020 1021 12/01/2020 1513 11/28/20 0532 11/29/20 0926  NA 134* 132* 130* 133*  131*  K 4.0 5.8* 5.9* 4.5 4.3  CL 96* 94* 92* 90* 92*  CO2 '26 22 22 27 24  '$ GLUCOSE 100* 126* 124* 70 120*  BUN 51* 81* 83* 40* 54*  CREATININE 9.28* 13.10* 13.93* 8.16* 9.71*  CALCIUM 8.3* 8.1* 7.9* 8.8* 8.0*  MG  --   --   --  2.0  --   PHOS  --   --  9.7*  --  6.4*   GFR: Estimated Creatinine Clearance: 6.4 mL/min (A) (by C-G formula based on SCr of 9.71 mg/dL (H)). Liver Function Tests: Recent Labs  Lab 11/23/20 2221 12/15/2020 1021 12/18/2020 1513 11/29/20 0926  AST 13* 12*  --   --   ALT 10 10  --   --   ALKPHOS 68 79  --   --   BILITOT 0.7 1.0  --   --   PROT 6.8 7.2  --   --   ALBUMIN 2.0* 2.1* 2.1* 1.8*   Recent Labs  Lab 11/23/20 2221  LIPASE 20   No results for input(s): AMMONIA in the last 168 hours. Coagulation Profile: Recent Labs  Lab 11/23/20 2221  INR 2.7*   Cardiac Enzymes: No results for input(s): CKTOTAL, CKMB, CKMBINDEX, TROPONINI in the last 168 hours. BNP (last 3 results) No results for input(s): PROBNP in the last 8760 hours. HbA1C: No results for input(s): HGBA1C in the last 72 hours. CBG: Recent Labs  Lab 11/28/20 0131 11/28/20 0639 11/28/20 2143 11/29/20 0640  GLUCAP 96 72 164* 153*   Lipid Profile: No results for input(s): CHOL, HDL, LDLCALC, TRIG, CHOLHDL, LDLDIRECT in the last 72 hours. Thyroid Function Tests: No results for input(s): TSH, T4TOTAL, FREET4, T3FREE, THYROIDAB in the last 72 hours. Anemia Panel: No results for input(s): VITAMINB12, FOLATE, FERRITIN, TIBC, IRON, RETICCTPCT in the last 72 hours. Urine analysis:    Component Value Date/Time   COLORURINE AMBER (A) 10/28/2020 1322   APPEARANCEUR TURBID (A) 10/28/2020 1322   LABSPEC 1.014 10/28/2020 1322   PHURINE 5.0 10/28/2020 1322   GLUCOSEU NEGATIVE 10/28/2020 1322   HGBUR MODERATE (A) 10/28/2020 1322   BILIRUBINUR NEGATIVE 10/28/2020 1322   KETONESUR NEGATIVE 10/28/2020 1322   PROTEINUR 100 (A) 10/28/2020 1322   NITRITE NEGATIVE 10/28/2020 1322    LEUKOCYTESUR LARGE (A) 10/28/2020 1322   Sepsis Labs: '@LABRCNTIP'$ (procalcitonin:4,lacticidven:4) ) Recent Results (from the past 240 hour(s))  Resp Panel by RT-PCR (Flu A&B, Covid) Nasopharyngeal Swab     Status: None   Collection Time: 11/23/20 10:20 PM   Specimen: Nasopharyngeal Swab; Nasopharyngeal(NP) swabs in vial transport medium  Result Value Ref Range Status   SARS Coronavirus 2 by RT PCR NEGATIVE NEGATIVE Final    Comment: (NOTE) SARS-CoV-2 target nucleic acids are NOT DETECTED.  The SARS-CoV-2 RNA is generally detectable in upper respiratory specimens during the acute phase of infection. The lowest concentration of SARS-CoV-2 viral  copies this assay can detect is 138 copies/mL. A negative result does not preclude SARS-Cov-2 infection and should not be used as the sole basis for treatment or other patient management decisions. A negative result may occur with  improper specimen collection/handling, submission of specimen other than nasopharyngeal swab, presence of viral mutation(s) within the areas targeted by this assay, and inadequate number of viral copies(<138 copies/mL). A negative result must be combined with clinical observations, patient history, and epidemiological information. The expected result is Negative.  Fact Sheet for Patients:  EntrepreneurPulse.com.au  Fact Sheet for Healthcare Providers:  IncredibleEmployment.be  This test is no t yet approved or cleared by the [place] FDA and  has been authorized for detection and/or diagnosis of SARS-CoV-2 by FDA under an Emergency Use Authorization (EUA). This EUA will remain  in effect (meaning this test can be used) for the duration of the COVID-19 declaration under Section 564(b)(1) of the Act, 21 U.S.C.section 360bbb-3(b)(1), unless the authorization is terminated  or revoked sooner.       Influenza A by PCR NEGATIVE NEGATIVE Final   Influenza B by PCR NEGATIVE  NEGATIVE Final    Comment: (NOTE) The Xpert Xpress SARS-CoV-2/FLU/RSV plus assay is intended as an aid in the diagnosis of influenza from Nasopharyngeal swab specimens and should not be used as a sole basis for treatment. Nasal washings and aspirates are unacceptable for Xpert Xpress SARS-CoV-2/FLU/RSV testing.  Fact Sheet for Patients: EntrepreneurPulse.com.au  Fact Sheet for Healthcare Providers: IncredibleEmployment.be  This test is not yet approved or cleared by the [place] FDA and has been authorized for detection and/or diagnosis of SARS-CoV-2 by FDA under an Emergency Use Authorization (EUA). This EUA will remain in effect (meaning this test can be used) for the duration of the COVID-19 declaration under Section 564(b)(1) of the Act, 21 U.S.C. section 360bbb-3(b)(1), unless the authorization is terminated or revoked.  Performed at [facility], [address], [place], [place] [address]   Culture, blood (routine x 2)     Status: None   Collection Time: 11/23/20 10:22 PM   Specimen: BLOOD LEFT FOREARM  Result Value Ref Range Status   Specimen Description BLOOD LEFT FOREARM  Final   Special Requests   Final    Blood Culture adequate volume BOTTLES DRAWN AEROBIC AND ANAEROBIC   Culture   Final    NO GROWTH 6 DAYS Performed at [facility], [address], [place], [place] [address]    Report Status 11/29/2020 FINAL  Final  Resp Panel by RT-PCR (Flu A&B, Covid) Nasopharyngeal Swab     Status: None   Collection Time: 12/18/2020 10:26 AM   Specimen: Nasopharyngeal Swab; Nasopharyngeal(NP) swabs in vial transport medium  Result Value Ref Range Status   SARS Coronavirus 2 by RT PCR NEGATIVE NEGATIVE Final    Comment: (NOTE) SARS-CoV-2 target nucleic acids are NOT DETECTED.  The SARS-CoV-2 RNA is generally detectable in upper respiratory specimens during the acute phase of infection. The lowest concentration of SARS-CoV-2 viral copies  this assay can detect is 138 copies/mL. A negative result does not preclude SARS-Cov-2 infection and should not be used as the sole basis for treatment or other patient management decisions. A negative result may occur with  improper specimen collection/handling, submission of specimen other than nasopharyngeal swab, presence of viral mutation(s) within the areas targeted by this assay, and inadequate number of viral copies(<138 copies/mL). A negative result must be combined with clinical observations, patient history, and epidemiological information. The expected result  is Negative.  Fact Sheet for Patients:  EntrepreneurPulse.com.au  Fact Sheet for Healthcare Providers:  IncredibleEmployment.be  This test is no t yet approved or cleared by the [place] FDA and  has been authorized for detection and/or diagnosis of SARS-CoV-2 by FDA under an Emergency Use Authorization (EUA). This EUA will remain  in effect (meaning this test can be used) for the duration of the COVID-19 declaration under Section 564(b)(1) of the Act, 21 U.S.C.section 360bbb-3(b)(1), unless the authorization is terminated  or revoked sooner.       Influenza A by PCR NEGATIVE NEGATIVE Final   Influenza B by PCR NEGATIVE NEGATIVE Final    Comment: (NOTE) The Xpert Xpress SARS-CoV-2/FLU/RSV plus assay is intended as an aid in the diagnosis of influenza from Nasopharyngeal swab specimens and should not be used as a sole basis for treatment. Nasal washings and aspirates are unacceptable for Xpert Xpress SARS-CoV-2/FLU/RSV testing.  Fact Sheet for Patients: EntrepreneurPulse.com.au  Fact Sheet for Healthcare Providers: IncredibleEmployment.be  This test is not yet approved or cleared by the [place] FDA and has been authorized for detection and/or diagnosis of SARS-CoV-2 by FDA under an Emergency Use Authorization (EUA). This EUA  will remain in effect (meaning this test can be used) for the duration of the COVID-19 declaration under Section 564(b)(1) of the Act, 21 U.S.C. section 360bbb-3(b)(1), unless the authorization is terminated or revoked.  Performed at [facility], [address] [place], [place] [address]   Blood culture (routine x 2)     Status: None (Preliminary result)   Collection Time: 12/12/2020 10:45 AM   Specimen: BLOOD  Result Value Ref Range Status   Specimen Description BLOOD LEFT ARM  Final   Special Requests   Final    BOTTLES DRAWN AEROBIC AND ANAEROBIC Blood Culture adequate volume   Culture   Final    NO GROWTH 2 DAYS Performed at [facility], [address], [place], [place] [address]    Report Status PENDING  Incomplete  Blood culture (routine x 2)     Status: None (Preliminary result)   Collection Time: 12/16/2020 12:42 PM   Specimen: Left Antecubital; Blood  Result Value Ref Range Status   Specimen Description LEFT ANTECUBITAL  Final   Special Requests   Final    Blood Culture adequate volume BOTTLES DRAWN AEROBIC AND ANAEROBIC   Culture   Final    NO GROWTH 2 DAYS Performed at [facility], [address], [place], [place] [address]    Report Status PENDING  Incomplete  MRSA Next Gen by PCR, Nasal     Status: Abnormal   Collection Time: 11/28/20  1:41 AM   Specimen: Nasal Mucosa; Nasal Swab  Result Value Ref Range Status   MRSA by PCR Next Gen DETECTED (A) NOT DETECTED Final    Comment: RESULT CALLED TO, READ BACK BY AND VERIFIED WITH: Starr Sinclair RN, AT [phone] 11/28/20 D. VANHOOK (NOTE) The GeneXpert MRSA Assay (FDA approved for NASAL specimens only), is one component of a comprehensive MRSA colonization surveillance program. It is not intended to diagnose MRSA infection nor to guide or monitor treatment for MRSA infections. Test performance is not FDA approved in patients less than 51 years old. Performed at [facility], [place] [address] [place], [place] [address]           Radiology Studies: No results found.    Scheduled Meds:  (feeding supplement) PROSource Plus  30 mL Oral BID BM  amiodarone  200 mg Oral Daily   aspirin EC  81 mg Oral QHS   atorvastatin  40 mg Oral QPM   Chlorhexidine Gluconate Cloth  6 each Topical Q0600   Chlorhexidine Gluconate Cloth  6 each Topical Q0600   collagenase   Topical Daily   darbepoetin (ARANESP) injection - DIALYSIS  150 mcg Intravenous Q Wed-HD   doxercalciferol  1.5 mcg Intravenous Q M,W,F-HD   enoxaparin (LOVENOX) injection  80 mg Subcutaneous Q24H   feeding supplement (NEPRO CARB STEADY)  237 mL Oral BID BM   gabapentin  300 mg Oral QHS   hydrocortisone  25 mg Rectal BID   insulin detemir  16 Units Subcutaneous QHS   midodrine  5 mg Oral TID WC   multivitamin  1 tablet Oral QHS   mupirocin ointment  1 application Nasal BID   ondansetron (ZOFRAN) IV  4 mg Intravenous Once   polyethylene glycol  17 g Oral Daily   senna-docusate  1 tablet Oral BID   sevelamer carbonate  2,400 mg Oral TID with meals   sodium chloride flush  3 mL Intravenous Q12H   Continuous Infusions:  sodium chloride     sodium chloride     sodium chloride       LOS: 2 days      Debbe Odea, MD [facility] Pager: [email or web address] 11/29/2020, 5:22 PM

## 2020-11-29 NOTE — Progress Notes (Signed)
Initial Nutrition Assessment  DOCUMENTATION CODES:   Not applicable  INTERVENTION:  -Nepro Shake po BID, each supplement provides 425 kcal and 19 grams protein -PROSource PLUS PO 68ms BID, each supplement provides 100 kcals and 15 grams of protein -renal MVI daily  NUTRITION DIAGNOSIS:   Increased nutrient needs related to chronic illness, wound healing (ESRD on HD) as evidenced by estimated needs.  GOAL:   Patient will meet greater than or equal to 90% of their needs  MONITOR:   PO intake, Supplement acceptance, Weight trends, Labs, I & O's, Skin  REASON FOR ASSESSMENT:   Malnutrition Screening Tool    ASSESSMENT:   Pt with PMH significant for ESRD on HD, bilateral BKA, type 2 DM, HTN, PVD, HLD who was recently discharged from the hospital on 7/19 after prolonged hospitalization during which she had AMS, PNA w/ ARF, septic shock, AKI requiring CRRT; required intubation during previous hospitalization. Pt presented to [facility] ED due to 2x missed HD 2/2 inability to sit down for HD 2/2 pain from decub[facility]. [facility] with gangrene of L hand.  Pt unavailable at time of RD visit. Per RN, pt currently in HD. PMT planning to meet with pt this afternoon to discuss GOC.   Pt with multiple wounds. Surgery evaluated pt for potential debridement in OR -- surgery not recommending this at this time. Recommending hydrotherapy instead.    EDW 97 kg Admission weight: 86.2 kg Current weight 89.8 kg  Question accuracy of outpatient EDW as pt's weight appears to have been trending down since February; April weight readings appear to align more closely to documented EDW.  Suspect pt meets criteria for malnutrition given ESRD and weight consistently trending down. Unable to diagnose at this time without nutrition-focused physical exam. Will attempt at follow-up.   No UOP documented x24 hours Stool: 2x unmeasured occurrences x24 hours I/O: -2L since  admit  Medications: Aranesp, Hectorol, Levemir, Zofran, Miralax, Senokot-S, Renvela Labs: Na 131 (L), Cr 9.71 (H), PO4 6.4 (H) CBGs [id number] Diet Order:   Diet Order             Diet renal/carb modified with fluid restriction Diet-HS Snack? Nothing; Fluid restriction: 1200 mL Fluid; Room service appropriate? Yes; Fluid consistency: Thin  Diet effective now                   EDUCATION NEEDS:   No education needs have been identified at this time  Skin:  Skin Assessment: Skin Integrity Issues: Skin Integrity Issues:: Unstageable, DTI, Incisions, Other (Comment) DTI: R breast, R flank Stage II: N/A Unstageable: acrum, L buttocks, lower vertebral column, L/R ischial tuberosity, lower R back, L knee, R flank, R hip Incisions: open incision groin, bilateral BKA Other: non-pressure wounds to R knee R arm, ischemic L hand/fingers  Last BM:  8/2 type 6  Height:   Ht Readings from Last 1 Encounters:  12/02/2020 '5\' 4"'$  (1.626 m)    Weight:   Wt Readings from Last 1 Encounters:  11/28/20 89.8 kg    BMI:  Body mass index is 33.98 kg/m.  Estimated Nutritional Needs:   Kcal:  2200-2400  Protein:  110-130 grams  Fluid:  1L+UOP    ALarkin Ina MS, RD, LDN (she/her/hers) RD pager number and weekend/on-call pager number located in [place]

## 2020-11-29 NOTE — Progress Notes (Signed)
Pt reportedly combative with MRI staff. No further attempts will be made for MRI this shift.

## 2020-11-29 NOTE — Plan of Care (Addendum)
Patient receiving treatment for multiple non-healing wounds. Physically and verbally aggressive towards staff this evening. Patient obviously suffering from multiple chronic illnesses; in poor physical condition. Will treat symptoms as needed per orders. Scheduled for MRI tonight.  Edit: off floor with transporter for MRI at 2038 hrs.  Edit: 2059 hrs: Call received from MRI staff that patient was  becoming combative with staff and they were unable to complete the scan. Patient en route back to unit. Dr. Clearence Ped and Dr. Wynelle Cleveland were notified by this RN.    Problem: Education: Goal: Knowledge of General Education information will improve Description: Including pain rating scale, medication(s)/side effects and non-pharmacologic comfort measures Outcome: Not Progressing   Problem: Health Behavior/Discharge Planning: Goal: Ability to manage health-related needs will improve Outcome: Not Progressing   Problem: Clinical Measurements: Goal: Ability to maintain clinical measurements within normal limits will improve Outcome: Not Progressing Goal: Will remain free from infection Outcome: Not Progressing Goal: Diagnostic test results will improve Outcome: Not Progressing Goal: Respiratory complications will improve Outcome: Not Progressing Goal: Cardiovascular complication will be avoided Outcome: Not Progressing   Problem: Activity: Goal: Risk for activity intolerance will decrease Outcome: Not Progressing   Problem: Nutrition: Goal: Adequate nutrition will be maintained Outcome: Not Progressing   Problem: Coping: Goal: Level of anxiety will decrease Outcome: Not Progressing   Problem: Elimination: Goal: Will not experience complications related to bowel motility Outcome: Not Progressing Goal: Will not experience complications related to urinary retention Outcome: Not Progressing   Problem: Pain Managment: Goal: General experience of comfort will improve Outcome: Not  Progressing   Problem: Safety: Goal: Ability to remain free from injury will improve Outcome: Not Progressing   Problem: Skin Integrity: Goal: Risk for impaired skin integrity will decrease Outcome: Not Progressing

## 2020-11-29 NOTE — Care Management (Signed)
Verified w liaison that patient is active w [facility].

## 2020-11-29 NOTE — Patient Instructions (Signed)
Visit Information  PATIENT GOALS:  Goals Addressed             This Visit's Progress    Maintain Independence and Quallity of Life: PVD with Multiple Amputations   On track    Timeframe:  Long-Range Goal Priority:  Medium Start Date:  09/22/20                           Expected End Date:                        Follow Up Date within 1 week of hospital discharge. Admitted on 12/17/2020.  Meet with vascular surgeon while inpatient to determine appropriate treatmentt     Manage ESRD   On track    Timeframe:  Long-Range Goal Priority:  High Start Date:                             Expected End Date:                        Follow Up Date within 1 week of hospital discharge. Admitted on 12/21/2020.  Work with hospital staff and nephrologist to have dialysis and manage ESRD while hospitalized Have MRI Abd while inpatient to further examine left kidney mass        Patient verbalizes understanding of instructions provided today and agrees to view in [place].   The patient has been provided with contact information for the care management team and has been advised to call with any health related questions or concerns.  RNCM will follow-up with patient by telephone within one week of discharge.  Chong Sicilian, BSN, RN-BC Embedded Chronic Care Manager [facility] Direct Dial: [phone]

## 2020-11-29 NOTE — Progress Notes (Signed)
Nephrology Follow-Up Consult note   Assessment/Recommendations: Christy Zhang is a/an 65 y.o. female with a past medical history significant for ESRD on HD admitted with gangrene of the left hand   Dialysis Orders from previous admission:  [facility] - MWF 4 hrs 400/500 97 kg 2.0K/2.5 Ca 16g needles -Heparin 1000 units initial bolus-heparin pump 1000 units/hr stop 1 hour prior to end of tx  -Epogen 14000 units IV TIW -Hectorol 1.5 mcg IV TIW   # ESRD: On dialysis today and tolerating well. Maintain MWF schedule # Volume/ hypertension: Attempt to achieve EDW as tolerated. On midodrine. Appears near euvolemic # Anemia of Chronic Kidney Disease: Hemoglobin 7. No iron given possible infection. Aranesp 153mg weekly ordered. Transfusions per primary # Secondary Hyperparathyroidism/Hyperphosphatemia: home sevlamer. Will give hectorol 1.521m w/ tx # Vascular access: AVF w/ no issues # Left radial artery occlussion: w/ gangrenous left fingers. Vascular surgery consulted. Minimal options. Agree with palliative care #Poor nutrition/Failure to thrive: Albumin only 1.8 reflective of nutrition but also chronic inflammation. She has had a poor trajectory and her prognosis is guarded. I agree with palliative care involvement.   # Additional recommendations: - Dose all meds for creatinine clearance < 10 ml/min  - Unless absolutely necessary, no MRIs with gadolinium.  - Implement save arm precautions.  Prefer needle sticks in the dorsum of the hands or wrists.  No blood pressure measurements in arm. - If blood transfusion is requested during hemodialysis sessions, please alert us[facility]orearior to the session.     Recommendations conveyed to primary service.    [facility] 11/29/2020 10:24 AM  ___________________________________________________________  CC: ESRD  Interval History/Subjective: Patient states she feels okay.  Minimal pain in her hand.  Denies any other  complaints  Medications:  Current Facility-Administered Medications  Medication Dose Route Frequency Provider Last Rate Last Admin   0.9 %  sodium chloride infusion  100 mL Intravenous PRN ShManuella GhaziPratik D, DO       0.9 %  sodium chloride infusion  100 mL Intravenous PRN ShManuella GhaziPratik D, DO       0.9 %  sodium chloride infusion  250 mL Intravenous PRN ShManuella GhaziPratik D, DO       acetaminophen (TYLENOL) tablet 650 mg  650 mg Oral Q6H PRN ShManuella GhaziPratik D, DO       Or   acetaminophen (TYLENOL) suppository 650 mg  650 mg Rectal Q6H PRN ShManuella GhaziPratik D, DO       alteplase (CATHFLO ACTIVASE) injection 2 mg  2 mg Intracatheter Once PRN ShManuella GhaziPratik D, DO       amiodarone (PACERONE) tablet 200 mg  200 mg Oral Daily ShManuella GhaziPratik D, DO   200 mg at 11/28/20 [id number] aspirin EC tablet 81 mg  81 mg Oral QHS Shah, Pratik D, DO   81 mg at 12/16/2020 2327   atorvastatin (LIPITOR) tablet 40 mg  40 mg Oral QPM ShManuella GhaziPratik D, DO       Chlorhexidine Gluconate Cloth 2 % PADS 6 each  6 each Topical Q0600 ShManuella GhaziPratik D, DO       Chlorhexidine Gluconate Cloth 2 % PADS 6 each  6 each Topical Q0600 ShHeath Lark, DO   6 each at 11/29/20 0603   collagenase (SANTYL) ointment   Topical Daily AdShelly CossMD   Given at 11/28/20 1400   Darbepoetin Alfa (ARANESP) injection 150 mcg  150 mcg Intravenous Q Wed-HD PeSantiago Bumpers  J, MD       diphenhydrAMINE (BENADRYL) capsule 25 mg  25 mg Oral Q6H PRN Manuella Ghazi, Pratik D, DO       doxercalciferol (HECTOROL) injection 1.5 mcg  1.5 mcg Intravenous Q M,W,F-HD Reesa Chew, MD       enoxaparin (LOVENOX) injection 80 mg  80 mg Subcutaneous Q24H Heath Lark D, DO   80 mg at 11/28/20 1548   gabapentin (NEURONTIN) capsule 300 mg  300 mg Oral QHS Manuella Ghazi, Pratik D, DO   300 mg at 11/30/2020 2327   heparin injection 1,000 Units  1,000 Units Dialysis PRN Heath Lark D, DO       heparin injection 1,700 Units  20 Units/kg Dialysis PRN Manuella Ghazi, Pratik D, DO       hydrocortisone (ANUSOL-HC) suppository  25 mg  25 mg Rectal BID Heath Lark D, DO   25 mg at 11/28/20 [id number]   HYDROmorphone (DILAUDID) injection 0.5-1 mg  0.5-1 mg Intravenous Q2H PRN Heath Lark D, DO   1 mg at 11/29/20 [id number]   insulin detemir (LEVEMIR) injection 16 Units  16 Units Subcutaneous QHS Heath Lark D, DO   16 Units at 11/28/20 2143   labetalol (NORMODYNE) injection 10 mg  10 mg Intravenous Q2H PRN Manuella Ghazi, Pratik D, DO       lidocaine (PF) (XYLOCAINE) 1 % injection 5 mL  5 mL Intradermal PRN Manuella Ghazi, Pratik D, DO       lidocaine-prilocaine (EMLA) cream 1 application  1 application Topical PRN Manuella Ghazi, Pratik D, DO       midodrine (PROAMATINE) tablet 5 mg  5 mg Oral TID WC Manuella Ghazi, Pratik D, DO   5 mg at 11/28/20 1319   mupirocin ointment (BACTROBAN) 2 % 1 application  1 application Nasal BID Heath Lark D, DO   1 application at [id number]   ondansetron (ZOFRAN) injection 4 mg  4 mg Intravenous Once Manuella Ghazi, Pratik D, DO       ondansetron (ZOFRAN) tablet 4 mg  4 mg Oral Q6H PRN Manuella Ghazi, Pratik D, DO       Or   ondansetron (ZOFRAN) injection 4 mg  4 mg Intravenous Q6H PRN Manuella Ghazi, Pratik D, DO       oxyCODONE (Oxy IR/ROXICODONE) immediate release tablet 5 mg  5 mg Oral Q6H PRN Manuella Ghazi, Pratik D, DO       pentafluoroprop-tetrafluoroeth (GEBAUERS) aerosol 1 application  1 application Topical PRN Manuella Ghazi, Pratik D, DO       polyethylene glycol (MIRALAX / GLYCOLAX) packet 17 g  17 g Oral Daily Manuella Ghazi, Pratik D, DO   17 g at 11/28/20 [id number]   senna-docusate (Senokot-S) tablet 1 tablet  1 tablet Oral BID Heath Lark D, DO   1 tablet at 11/28/20 [id number]   sevelamer carbonate (RENVELA) tablet 2,400 mg  2,400 mg Oral TID with meals Manuella Ghazi, Pratik D, DO   2,400 mg at 11/28/20 1200   sodium chloride flush (NS) 0.9 % injection 3 mL  3 mL Intravenous Q12H Shah, Pratik D, DO   3 mL at 11/28/20 0948   sodium chloride flush (NS) 0.9 % injection 3 mL  3 mL Intravenous PRN Heath Lark D, DO          Review of Systems: 10 systems reviewed and negative except per  interval history/subjective  Physical Exam: Vitals:   11/28/20 1654 11/29/20 0432  BP: (!) 147/71 (!) 127/49  Pulse: 74 76  Resp: 19 18  Temp: 99.4 F (37.4 C)  98.6 F (37 C)  SpO2: 100%    No intake/output data recorded.  Intake/Output Summary (Last 24 hours) at 11/29/2020 1024 Last data filed at 11/29/2020 0604 Gross per 24 hour  Intake 0 ml  Output 0 ml  Net 0 ml   Constitutional: Chronically ill-appearing, no distress ENMT: ears and nose without scars or lesions, MMM CV: normal rate, no obvious murmur Respiratory: Bilateral chest rise normal work of breathing Gastrointestinal: soft, non-tender, no palpable masses or hernias Skin: Extensive necrosis of the left hand, scattered abrasions, no other rashes Psych: Patient is tired but awake and alert and answers questions appropriately Extremities: Bilateral lower extremities absent below the knee   Test Results I personally reviewed new and old clinical labs and radiology tests Lab Results  Component Value Date   NA 131 (L) 11/29/2020   K 4.3 11/29/2020   CL 92 (L) 11/29/2020   CO2 24 11/29/2020   BUN 54 (H) 11/29/2020   CREATININE 9.71 (H) 11/29/2020   CALCIUM 8.0 (L) 11/29/2020   ALBUMIN 1.8 (L) 11/29/2020   PHOS 6.4 (H) 11/29/2020

## 2020-11-29 NOTE — Chronic Care Management (AMB) (Signed)
Care Management    RN Visit Note  11/28/2020 Name: Christy Zhang MRN: [id number] DOB: 15-Nov-1955  Subjective: Christy Zhang is a 65 y.o. year old female who is a primary care patient of [name], [name], MD. The care management team was consulted for assistance with disease management and care coordination needs.    Collaboration with daughter and hospitalist  for  care coordination  in response to provider referral for case management and/or care coordination services.   Consent to Services:   Ms. Grigoryan was given information about Care Management services today including:  Care Management services includes personalized support from designated clinical staff supervised by her physician, including individualized plan of care and coordination with other care providers 24/7 contact phone numbers for assistance for urgent and routine care needs. The patient may stop case management services at any time by phone call to the office staff.  Patient agreed to services and consent obtained.   Assessment: Review of patient past medical history, allergies, medications, health status, including review of consultants reports, laboratory and other test data, was performed as part of comprehensive evaluation and provision of chronic care management services.   SDOH (Social Determinants of Health) assessments and interventions performed:    Care Plan  Allergies  Allergen Reactions   Sulfa Antibiotics Swelling    Facility-Administered Encounter Medications as of 11/28/2020  Medication   0.9 %  sodium chloride infusion   0.9 %  sodium chloride infusion   0.9 %  sodium chloride infusion   acetaminophen (TYLENOL) tablet 650 mg   Or   acetaminophen (TYLENOL) suppository 650 mg   alteplase (CATHFLO ACTIVASE) injection 2 mg   amiodarone (PACERONE) tablet 200 mg   aspirin EC tablet 81 mg   atorvastatin (LIPITOR) tablet 40 mg   Chlorhexidine Gluconate Cloth 2 % PADS 6 each   Chlorhexidine  Gluconate Cloth 2 % PADS 6 each   collagenase (SANTYL) ointment   Darbepoetin Alfa (ARANESP) injection 150 mcg   diphenhydrAMINE (BENADRYL) capsule 25 mg   doxercalciferol (HECTOROL) injection 1.5 mcg   enoxaparin (LOVENOX) injection 80 mg   gabapentin (NEURONTIN) capsule 300 mg   heparin injection 1,000 Units   heparin injection 1,700 Units   hydrocortisone (ANUSOL-HC) suppository 25 mg   HYDROmorphone (DILAUDID) injection 0.5-1 mg   insulin detemir (LEVEMIR) injection 16 Units   labetalol (NORMODYNE) injection 10 mg   lidocaine (PF) (XYLOCAINE) 1 % injection 5 mL   lidocaine-prilocaine (EMLA) cream 1 application   midodrine (PROAMATINE) tablet 5 mg   mupirocin ointment (BACTROBAN) 2 % 1 application   ondansetron (ZOFRAN) injection 4 mg   ondansetron (ZOFRAN) tablet 4 mg   Or   ondansetron (ZOFRAN) injection 4 mg   oxyCODONE (Oxy IR/ROXICODONE) immediate release tablet 5 mg   pentafluoroprop-tetrafluoroeth (GEBAUERS) aerosol 1 application   polyethylene glycol (MIRALAX / GLYCOLAX) packet 17 g   senna-docusate (Senokot-S) tablet 1 tablet   sevelamer carbonate (RENVELA) tablet 2,400 mg   sodium chloride flush (NS) 0.9 % injection 3 mL   sodium chloride flush (NS) 0.9 % injection 3 mL   Outpatient Encounter Medications as of 11/28/2020  Medication Sig Note   acetaminophen (TYLENOL) 325 MG tablet Take 1-2 tablets (325-650 mg total) by mouth every 4 (four) hours as needed for mild pain.    amiodarone (PACERONE) 200 MG tablet Take 1 tablet (200 mg total) by mouth daily for 19 days.    apixaban (ELIQUIS) 5 MG TABS tablet Take 1 tablet (5  mg total) by mouth 2 (two) times daily.    aspirin EC 81 MG tablet Take 81 mg by mouth at bedtime. Swallow whole.    atorvastatin (LIPITOR) 40 MG tablet TAKE ONE TABLET BY MOUTH DAILY    diphenhydrAMINE (BENADRYL) 25 MG tablet Take 25 mg by mouth every 6 (six) hours as needed for itching.    gabapentin (NEURONTIN) 300 MG capsule Take 1 capsule (300 mg  total) by mouth at bedtime.    insulin detemir (LEVEMIR) 100 UNIT/ML injection Inject 0.16 mLs (16 Units total) into the skin daily.    Insulin Syringe-Needle U-100 28G X 1/2" 1 ML MISC Use with insulin daily Dx E11.22    midodrine (PROAMATINE) 5 MG tablet Take 1 tablet (5 mg total) by mouth 3 (three) times daily with meals.    Misc. Devices (EGG CRATE BED PAD) MISC Use to treat and prevent bed sores    oxyCODONE (OXY IR/ROXICODONE) 5 MG immediate release tablet Take 1 tablet (5 mg total) by mouth every 6 (six) hours as needed for severe pain.    polyethylene glycol (MIRALAX / GLYCOLAX) 17 g packet Take 17 g by mouth daily. (Patient not taking: Reported on 12/03/2020)    senna-docusate (SENOKOT-S) 8.6-50 MG tablet Take 1 tablet by mouth 2 (two) times daily.    sevelamer carbonate (RENVELA) 800 MG tablet Take 1,600-2,400 mg by mouth See admin instructions. Take 2400 mg with each meal and 1600 mg with each snack    TRULICITY 1.5 FK/8.1EX SOPN INJECT CONTENTs OF ONE PEN UNDER THE SKIN WEEKLY ON SATURDAY (Patient taking differently: Inject 1.5 mg into the skin once a week. On Saturdays) 10/27/2020: Missed it sat so she took it on Wed    Patient Active Problem List   Diagnosis Date Noted   ESRD (end stage renal disease) on dialysis ([place]) 12/02/2020   Paroxysmal atrial fibrillation ([place])    Cardiac arrest ([place])    CAP (community acquired pneumonia) 10/27/2020   Altered mental status 10/27/2020   Acute respiratory failure with hypoxemia ([place]) 10/27/2020   Lactic acidosis 10/27/2020   Hypoalbuminemia 10/27/2020   Hyponatremia 10/27/2020   Hyperlipidemia 10/27/2020   S/P BKA (below knee amputation) unilateral, right ([place]) 09/14/2020   Diabetic foot infection ([place])    Medication monitoring encounter    Pressure injury of skin 08/11/2020   Thrombocytosis 08/09/2020   Osteomyelitis of right foot ([place]) 08/08/2020   Leucocytosis 07/05/2020   Below-knee amputation of left lower extremity ([place]) 06/16/2020    S/P BKA (below knee amputation) unilateral ([place]) 06/08/2020   Ischemic ulcer of toe of right foot ([place]) 04/13/2020   PVD (peripheral vascular disease)/ S/p BKA 04/13/2020   ESRD on hemodialysis ([place]) 01/27/2020   Abdominal wall cellulitis    Panniculitis 12/29/2019   Osteomyelitis ([place]) 10/15/2019   Hyperglycemia due to diabetes mellitus ([place]) 10/15/2019   Osteomyelitis of third toe of right foot ([place]) 10/14/2019   ESRD (end stage renal disease) ([place])    Asthma, mild persistent 07/25/2015   Hypertension secondary to other renal disorders 07/25/2015   Controlled type 2 diabetes mellitus with complication, with long-term current use of insulin ([place]) 03/30/2015   Anemia of chronic disease 02/17/2015   Osteopathy in diseases classified elsewhere, unspecified site 02/17/2015   Essential (primary) hypertension 06/24/2014   Heart failure ([place]) 06/24/2014   Obesity, Class III, BMI 40-49.9 (morbid obesity) ([place]) 06/24/2014    Conditions to be addressed/monitored: ESRD and PVD  Care Plan : [facility] Care Plan  Updates made  by Ilean China, RN since 11/29/2020 12:00 AM     Problem: Care Coordination and Chronic Disease Management Needs r/t ESRD, DM, HTN, and PVD   Priority: Medium     Goal: Work with RN Care Manager Regarding Care Management and Care Coordination Associated with ESRD, DM, HTN, and PVD   Recent Progress: On track  Priority: Medium  Note:   Current Barriers:  Care Coordination needs related to Level of care concerns and ADL IADL limitations in a patient with HTN, DMII, ESRD, and PVD with multiple amputations Chronic Disease Management support and education needs related to HTN, DMII, ESRD, and PVD with multiple amputations  RNCM Clinical Goal(s):  Patient will work with RN Case Manager to address needs related to HTN, DMII, ESRD, and PVD with multiple amputations and Level of care concerns and ADL IADL limitations through collaboration with RN Care manager, provider, and  care team.   Interventions: 1:1 collaboration with primary care provider regarding development and update of comprehensive plan of care as evidenced by provider attestation and co-signature Inter-disciplinary care team collaboration (see longitudinal plan of care)   Diabetes:  (Status: Condition stable. Not addressed this visit.) Lab Results  Component Value Date   HGBA1C 6.4 (H) 10/27/2020  Assessed patient's understanding of A1c goal: <7% Evaluation of current treatment plan related to diabetes and patient's adherence to plan as established by provider. Chart reviewed including relevant office notes and lab results Reinforced need to continue checking and recording blood sugar readings twice a day Discussed home readings and advised to call PCP with any readings below 70 or above 200 Reviewed upcoming appointments Reinforced a healthy low sodium and ADA/carb modified diet Discussed family/social support Has assistance from her adult daughters One daughter just had a baby on 09/06/20 Discussed need for lower extremity amputation and therapuetic listening utilized.  Provided with RN Care manager contact number and encouraged to reach out as needed Reviewed upcoming appointments Encouraged to reach out to LCSW with psychosocial needs  ESRD  (Status: Goal on track: YES.) Lab Results  Component Value Date   CREATININE 8.16 (H) 11/28/2020   BUN 40 (H) 11/28/2020   NA 133 (L) 11/28/2020   K 4.5 11/28/2020   CL 90 (L) 11/28/2020   CO2 27 11/28/2020  CT Abd from 11/02/20 IMPRESSION: 3.5 cm heterogeneously enhancing abnormality is again noted arising from lower pole of left kidney concerning for possible neoplasm. Further evaluation with MRI with and without gadolinium is recommended.  Evaluation of current treatment plan related to ESRD, Level of care concerns and ADL IADL limitations, self-management and patient's adherence to plan as established by provider. Discussed plans with  patient for ongoing care management follow up and provided patient with direct contact information for care management team Evaluation of current treatment plan related to ESRD and patient's adherence to plan as established by provider; Reviewed [facility] inpatient records. Patient receiving dialysis Collaborated with patient's daughter, [name], regarding current level of care Collaborated with primary hospitalist, Shelly Coss, MD, regarding MRI abd order. Believes patient will have that done today.    Hypertension: (Status: Condition stable. Not addressed this visit.) Last practice recorded BP readings:  BP Readings from Last 3 Encounters:  11/28/20 (!) 141/65  11/24/20 129/60  11/23/20 127/63  Most recent eGFR/CrCl:  Lab Results  Component Value Date   EGFR 10 (L) 10/18/2020    No components found for: CRCL  Evaluation of current treatment plan related to hypertension and patient's adherence to plan as  established by provider. Chart reviewed including relevant office notes and lab results Reviewed recent in-office blood pressure readings. They are consistently high. Discussed home readings Patient doesn't check at home herself but home health nurse is checking twice a week for now and it is checked 3 times a week at dialysis Reviewed and discussed medications Patient reports being compliant with medications and cost isn't a problem as they are free Discussed importance of blood pressure control Recommended DASH diet Provided with RN Care Manager contact number and encouraged to reach out as needed Again discussed results of abd CT scan with daughter, [name], and need for MRI to follow-up on enlarging left kidney mass that may be cancerous. Advised that the results of that scan may impact future medical decisions Previously collaborated with PCP and covering provider regarding need for MRI or urology referral to follow-up on mass Reviewed upcoming appointments: Hematology/Oncology to  discuss abnormal blood cell counts on 12/06/20 Collaborated via Secure Message with ED physician, Dr Manuella Ghazi, who agreed to order abdominal MRI to be done once transferred to [facility] from [facility]   PVD  (Status: Goal on track: YES.) Evaluation of current treatment plan related to  peripheral vascular disease and multiple amputations , Level of care concerns and ADL IADL limitations self-management and patient's adherence to plan as established by provider. Discussed plans with patient for ongoing care management follow up and provided patient with direct contact information for care management team Talked with patient's daughter, [name], via telephone regarding current medical status Reviewed inpatient notes from [facility]. Left hand does not seem salvageable. Wound care orders for sacral decubitis.  Patient has home health support from [facility] for after discharge Discussed family support from her two daughters Discussed ability to perform ADLs  Patient Goals/Self-Care Activities: Patient will self administer medications as prescribed Patient will attend all scheduled provider appointments Patient will call provider office for new concerns or questions       Plan: The patient has been provided with contact information for the care management team and has been advised to call with any health related questions or concerns. RNCM will follow-up with patient by telephone within one week of discharge.   Chong Sicilian, BSN, RN-BC Embedded Chronic Care Manager [facility] [place] [facility] Direct Dial: [phone]

## 2020-11-30 ENCOUNTER — Inpatient Hospital Stay (HOSPITAL_COMMUNITY): Payer: Medicare Other

## 2020-11-30 DIAGNOSIS — N186 End stage renal disease: Secondary | ICD-10-CM | POA: Diagnosis not present

## 2020-11-30 DIAGNOSIS — I480 Paroxysmal atrial fibrillation: Secondary | ICD-10-CM | POA: Diagnosis not present

## 2020-11-30 DIAGNOSIS — I96 Gangrene, not elsewhere classified: Secondary | ICD-10-CM | POA: Diagnosis not present

## 2020-11-30 LAB — RENAL FUNCTION PANEL
Albumin: 2 g/dL — ABNORMAL LOW (ref 3.5–5.0)
Anion gap: 19 — ABNORMAL HIGH (ref 5–15)
BUN: 36 mg/dL — ABNORMAL HIGH (ref 8–23)
CO2: 19 mmol/L — ABNORMAL LOW (ref 22–32)
Calcium: 8.9 mg/dL (ref 8.9–10.3)
Chloride: 93 mmol/L — ABNORMAL LOW (ref 98–111)
Creatinine, Ser: 6.62 mg/dL — ABNORMAL HIGH (ref 0.44–1.00)
GFR, Estimated: 7 mL/min — ABNORMAL LOW (ref >60)
Glucose, Bld: 120 mg/dL — ABNORMAL HIGH (ref 70–99)
Phosphorus: 4.6 mg/dL (ref 2.5–4.6)
Potassium: 5.5 mmol/L — ABNORMAL HIGH (ref 3.5–5.1)
Sodium: 131 mmol/L — ABNORMAL LOW (ref 135–145)

## 2020-11-30 LAB — GLUCOSE, CAPILLARY: Glucose-Capillary: 110 mg/dL — ABNORMAL HIGH (ref 70–99)

## 2020-11-30 MED ORDER — DARBEPOETIN ALFA 150 MCG/0.3ML IJ SOSY: 150.0000 ug | PREFILLED_SYRINGE | INTRAMUSCULAR | Status: DC

## 2020-11-30 MED ORDER — ENOXAPARIN SODIUM 100 MG/ML IJ SOSY
90.0000 mg | PREFILLED_SYRINGE | INTRAMUSCULAR | Status: DC
  Administered 2020-11-30 – 2020-12-04 (×5): 90 mg via SUBCUTANEOUS
  Filled 2020-11-30 (×5): qty 1

## 2020-11-30 MED ORDER — SODIUM ZIRCONIUM CYCLOSILICATE 10 G PO PACK
10.0000 g | PACK | Freq: Once | ORAL | Status: AC
  Administered 2020-11-30: 10 g via ORAL
  Filled 2020-11-30: qty 1

## 2020-11-30 NOTE — Plan of Care (Addendum)
Patient is subjectively in more pain tonight than last night (see MAR for PRN coverage). Patient was transferred from one [facility] bed to another one due to an air loss alarm. Patient is more somnolent this evening than last as well. VS stable for now. Updated the patient's daughter, [name], over telephone.  Edit: unable to change dressings this AM as patient refused and became physically combative towards this RN.   Problem: Education: Goal: Knowledge of General Education information will improve Description: Including pain rating scale, medication(s)/side effects and non-pharmacologic comfort measures Outcome: Not Progressing   Problem: Health Behavior/Discharge Planning: Goal: Ability to manage health-related needs will improve Outcome: Not Progressing   Problem: Clinical Measurements: Goal: Ability to maintain clinical measurements within normal limits will improve Outcome: Not Progressing Goal: Will remain free from infection Outcome: Not Progressing Goal: Diagnostic test results will improve Outcome: Not Progressing Goal: Respiratory complications will improve Outcome: Not Progressing Goal: Cardiovascular complication will be avoided Outcome: Not Progressing   Problem: Activity: Goal: Risk for activity intolerance will decrease Outcome: Not Progressing   Problem: Nutrition: Goal: Adequate nutrition will be maintained Outcome: Not Progressing   Problem: Coping: Goal: Level of anxiety will decrease Outcome: Not Progressing   Problem: Elimination: Goal: Will not experience complications related to bowel motility Outcome: Not Progressing Goal: Will not experience complications related to urinary retention Outcome: Not Progressing   Problem: Pain Managment: Goal: General experience of comfort will improve Outcome: Not Progressing   Problem: Safety: Goal: Ability to remain free from injury will improve Outcome: Not Progressing   Problem: Skin Integrity: Goal: Risk  for impaired skin integrity will decrease Outcome: Not Progressing

## 2020-11-30 NOTE — Progress Notes (Signed)
Physical Therapy Wound Treatment Patient Details  Name: Christy Zhang MRN: [id number] Date of Birth: 10-Jul-1955  Today's Date: 11/30/2020 Time: 1330-1505 Time Calculation (min): 95 min  Subjective  Subjective Assessment Subjective: Pleasant and agreeable to hydrotherapy Patient and Family Stated Goals: None stated Date of Onset:  (Unknown) Prior Treatments:  (Dressing changes)  Pain Score:  Premedicated and tolerated treatment with less reported pain than yesterday's session.  Wound Assessment  Pressure Injury 11/28/20 Sacrum Unstageable - Full thickness tissue loss in which the base of the injury is covered by slough (yellow, tan, gray, green or brown) and/or eschar (tan, brown or black) in the wound bed. (Active)  Dressing Type Gauze (Comment);Moist to moist;Foam - Lift dressing to assess site every shift;Santyl 11/30/20 1510  Dressing Changed;Clean;Dry;Intact 11/30/20 1510  Dressing Change Frequency Daily 11/30/20 1510  State of Healing Eschar 11/30/20 1510  Site / Wound Assessment Pink;Yellow;Black 11/30/20 1510  % Wound base Red or Granulating 25% 11/30/20 1510  % Wound base Yellow/Fibrinous Exudate 25% 11/30/20 1510  % Wound base Black/Eschar 50% 11/30/20 1510  % Wound base Other/Granulation Tissue (Comment) 0% 11/30/20 1510  Peri-wound Assessment Intact 11/30/20 1510  Wound Length (cm) 14 cm 11/29/20 1346  Wound Width (cm) 9 cm 11/29/20 1346  Wound Depth (cm) 1.8 cm 11/29/20 1346  Wound Surface Area (cm^2) 126 cm^2 11/29/20 1346  Wound Volume (cm^3) 226.8 cm^3 11/29/20 1346  Tunneling (cm) 0 11/29/20 1513  Undermining (cm) 0 11/29/20 1513  Margins Unattached edges (unapproximated) 11/30/20 1510  Drainage Amount Moderate 11/30/20 1510  Drainage Description Purulent 11/30/20 1510  Treatment Debridement (Selective);Hydrotherapy (Pulse lavage);Packing (Saline gauze) 11/30/20 1510     Pressure Injury 11/28/20 Flank Right Unstageable - Full thickness tissue loss in which  the base of the injury is covered by slough (yellow, tan, gray, green or brown) and/or eschar (tan, brown or black) in the wound bed. (Active)  Dressing Type Gauze (Comment);Moist to moist;Santyl;Foam - Lift dressing to assess site every shift 11/30/20 1510  Dressing Changed;Clean;Dry;Intact 11/30/20 1510  Dressing Change Frequency Daily 11/30/20 1510  State of Healing Eschar 11/30/20 1510  Site / Wound Assessment Pink;Black;Yellow 11/30/20 1510  % Wound base Red or Granulating 10% 11/30/20 1510  % Wound base Yellow/Fibrinous Exudate 20% 11/30/20 1510  % Wound base Black/Eschar 70% 11/30/20 1510  % Wound base Other/Granulation Tissue (Comment) 0% 11/30/20 1510  Peri-wound Assessment Intact 11/30/20 1510  Wound Length (cm) 7 cm 11/29/20 1346  Wound Width (cm) 18 cm 11/29/20 1346  Wound Depth (cm) 0.1 cm 11/29/20 1346  Wound Surface Area (cm^2) 126 cm^2 11/29/20 1346  Wound Volume (cm^3) 12.6 cm^3 11/29/20 1346  Tunneling (cm) 0 11/29/20 1513  Undermining (cm) 0 11/29/20 1513  Margins Unattached edges (unapproximated) 11/30/20 1510  Drainage Amount Minimal 11/30/20 1510  Drainage Description Purulent 11/30/20 1510  Treatment Debridement (Selective);Hydrotherapy (Pulse lavage);Packing (Saline gauze) 11/30/20 1510     Pressure Injury 11/28/20 Hip Right;Lateral Unstageable - Full thickness tissue loss in which the base of the injury is covered by slough (yellow, tan, gray, green or brown) and/or eschar (tan, brown or black) in the wound bed. (Active)  Dressing Type Foam - Lift dressing to assess site every shift;Gauze (Comment);Moist to moist;Santyl 11/30/20 1510  Dressing Changed;Clean;Dry;Intact 11/30/20 1510  Dressing Change Frequency Daily 11/30/20 1510  State of Healing Eschar 11/30/20 1510  Site / Wound Assessment Black 11/30/20 1510  % Wound base Red or Granulating 0% 11/30/20 1510  % Wound base Yellow/Fibrinous Exudate 0% 11/30/20  1510  % Wound base Black/Eschar 100% 11/30/20 1510   % Wound base Other/Granulation Tissue (Comment) 0% 11/30/20 1510  Peri-wound Assessment Intact 11/30/20 1510  Wound Length (cm) 5 cm 11/29/20 1346  Wound Width (cm) 4.5 cm 11/29/20 1346  Wound Depth (cm) 0.1 cm 11/29/20 1346  Wound Surface Area (cm^2) 22.5 cm^2 11/29/20 1346  Wound Volume (cm^3) 2.25 cm^3 11/29/20 1346  Tunneling (cm) 0 11/29/20 1513  Undermining (cm) 0 11/29/20 1513  Margins Unattached edges (unapproximated) 11/30/20 1510  Drainage Amount Scant 11/30/20 1510  Drainage Description Serosanguineous 11/30/20 1510  Treatment Debridement (Selective);Hydrotherapy (Pulse lavage);Packing (Saline gauze) 11/30/20 1510      Hydrotherapy Pulsed lavage therapy - wound location: Sacrum, R hip, and R flank Pulsed Lavage with Suction (psi): 4 psi Pulsed Lavage with Suction - Normal Saline Used: 2000 mL Pulsed Lavage Tip: Tip with splash shield Selective Debridement Selective Debridement - Location: Sacrum, R hip, and R flank Selective Debridement - Tools Used: Forceps, Scalpel, Scissors Selective Debridement - Tissue Removed: Black eschar    Wound Assessment and Plan  Wound Therapy - Assess/Plan/Recommendations Wound Therapy - Clinical Statement: Daughter and sister present at beginning of session to visualize wounds for the first time. They were appreciative for the time and opportunity to ask therapist questions regarding wounds. Pt tolerated treatment with less reported pain this session. This patient will benefit from continued hydrotherapy for selective removal of unviable tissue, to decrease bioburden, and promote wound bed healing. Wound Therapy - Functional Problem List: Decreased tolerance for position changes and OOB Factors Delaying/Impairing Wound Healing: Diabetes Mellitus, Immobility, Multiple medical problems Hydrotherapy Plan: Debridement, Dressing change, Patient/family education, Pulsatile lavage with suction Wound Therapy - Frequency: 6X / week Wound Therapy -  Follow Up Recommendations: dressing changes by RN  Wound Therapy Goals- Improve the function of patient's integumentary system by progressing the wound(s) through the phases of wound healing (inflammation - proliferation - remodeling) by: Wound Therapy Goals - Improve the function of patient's integumentary system by progressing the wound(s) through the phases of wound healing by: Decrease Necrotic Tissue to: 20% Decrease Necrotic Tissue - Progress: Progressing toward goal Increase Granulation Tissue to: 80% Increase Granulation Tissue - Progress: Progressing toward goal Goals/treatment plan/discharge plan were made with and agreed upon by patient/family: Yes Time For Goal Achievement: 7 days Wound Therapy - Potential for Goals: Good  Goals will be updated until maximal potential achieved or discharge criteria met.  Discharge criteria: when goals achieved, discharge from hospital, MD decision/surgical intervention, no progress towards goals, refusal/missing three consecutive treatments without notification or medical reason.  GP     Charges PT Wound Care Charges $Wound Debridement up to 20 cm: < or equal to 20 cm $ Wound Debridement each add'l 20 sqcm: 9 $PT PLS Gun and Tip: 1 Supply $PT Hydrotherapy Visit: 2 Visits       Thelma Comp 11/30/2020, 3:26 PM  Rolinda Roan, PT, DPT Acute Rehabilitation Services Pager: [phone] Office: [phone]

## 2020-11-30 NOTE — Progress Notes (Signed)
ANTICOAGULATION CONSULT NOTE - Follow Up Consult  Pharmacy Consult for Lovenox Indication: atrial fibrillation  Allergies  Allergen Reactions   Sulfa Antibiotics Swelling    Patient Measurements: Height: '5\' 4"'$  (162.6 cm) Weight:  (Unable to obtain bed weight) IBW/kg (Calculated) : 54.7  Vital Signs: Temp: 98.6 F (37 C) (08/04 0556) Temp Source: Axillary (08/04 0556) BP: 147/64 (08/04 0556) Pulse Rate: 70 (08/04 0556)  Labs: Recent Labs    12/11/2020 1513 11/28/20 0532 11/29/20 0926 11/29/20 0927 11/30/20 0316  HGB 8.0* 7.5*  --  7.0*  --   HCT 25.9* 24.7*  --  22.2*  --   PLT 735* 703*  --  682*  --   CREATININE 13.93* 8.16* 9.71*  --  6.62*    Estimated Creatinine Clearance: 9.4 mL/min (A) (by C-G formula based on SCr of 6.62 mg/dL (H)).   Assessment: Anticoag: Lovenox for afib. Hold PTA apixaban 5 mg BID last dose 7/31 PM. Hgb 7. Plts elevated 682. Dose for ESRD  Goal of Therapy:   Monitor platelets by anticoagulation protocol: Yes   Plan:  Increase Lovenox 90 mg subq every 24 hours  - Apixaban on hold > may need amputation of L hand/fingers - Also needs BL BKA. Vascular endorses comfort care. Family want aggressive care. Monitor CBC q72h on LMWH and s/s of bleeding  --Aranesp 150 mcg IV QWed to begin 8/3- not given, reschedule for 8/5   Christy Zhang, PharmD, BCPS Clinical Staff Pharmacist [email or web address]  Alford Zhang, Christy Zhang 11/30/2020,7:33 AM

## 2020-11-30 NOTE — Progress Notes (Signed)
Nephrology Follow-Up Consult note   Assessment/Recommendations: Christy Zhang is a/an 65 y.o. 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  Lokelma 10 g x 1 for mild hyperkalemia today. Maintain MWF schedule  # Volume/ hypertension: Attempt to achieve EDW as tolerated. On midodrine. Appears near euvolemic  # Anemia of Chronic Kidney Disease: Hemoglobin 7. No iron given possible infection. Aranesp 183mg weekly ordered. Transfusions per primary  # Secondary Hyperparathyroidism/Hyperphosphatemia: home sevlamer. 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 Vascular surgery consulted. Minimal options.  #Poor nutrition/Failure to thrive: Albumin less than 2 reflective of nutrition but also chronic inflammation. She has had a poor trajectory and her prognosis is guarded.  Palliative care involved but the patient wants full code and continued aggressive measures.  I would not recommend this but the patient has made this decision with their family.  Not sure if the patient will tolerate dialysis in a chair because of her wounds.  She may need to go to an LTACH  #Renal mass: Attempted to get MRI last night but the patient refused.  Management per primary team   # Additional recommendations: - Dose all meds for creatinine clearance < 10 ml/min  - Unless absolutely necessary, no MRIs with gadolinium.  - Implement save arm precautions.  Prefer needle sticks in the dorsum of the hands or wrists.  No blood pressure measurements in  arm. - If blood transfusion is requested during hemodialysis sessions, please alert us[facility]orearior to the session.     Recommendations conveyed to primary service.    [facility] 11/30/2020 9:56 AM  ___________________________________________________________  CC: ESRD  Interval History/Subjective: Patient states she feels okay but she is very tired and has a hard time answering questions.  Tolerated dialysis yesterday.  Met with palliative care yesterday.  The patient wants continued aggressive care despite her tenuous clinical status.  Medications:  Current Facility-Administered Medications  Medication Dose Route Frequency Provider Last Rate Last Admin   (feeding supplement) PROSource Plus liquid 30 mL  30 mL Oral BID BM Rizwan, Saima, MD   30 mL at 11/29/20 1523   0.9 %  sodium chloride infusion  100 mL Intravenous PRN ShManuella GhaziPratik D, DO       0.9 %  sodium chloride infusion  100 mL Intravenous PRN ShManuella GhaziPratik D, DO       0.9 %  sodium chloride infusion  250 mL Intravenous PRN ShManuella GhaziPratik D, DO       acetaminophen (TYLENOL) tablet 650 mg  650 mg Oral Q6H PRN ShManuella GhaziPratik D, DO       Or   acetaminophen (TYLENOL) suppository 650 mg  650 mg Rectal Q6H PRN ShManuella GhaziPratik D, DO       alteplase (CATHFLO ACTIVASE) injection 2 mg  2 mg Intracatheter Once PRN ShManuella GhaziPratik D, DO       amiodarone (PACERONE) tablet 200 mg  200 mg Oral Daily Shah, Pratik D, DO   200  mg at 11/30/20 0950   aspirin EC tablet 81 mg  81 mg Oral QHS Heath Lark D, DO   81 mg at 11/28/2020 2327   atorvastatin (LIPITOR) tablet 40 mg  40 mg Oral QPM Manuella Ghazi, Pratik D, DO   40 mg at 11/29/20 1640   Chlorhexidine Gluconate Cloth 2 % PADS 6 each  6 each Topical Q0600 Manuella Ghazi, Pratik D, DO       Chlorhexidine Gluconate Cloth 2 % PADS 6 each  6 each Topical Q0600 Heath Lark D, DO   6 each at 11/30/20 0556   collagenase (SANTYL) ointment   Topical Daily Shelly Coss, MD   Given at 11/30/20 0954    [START ON 12/01/2020] Darbepoetin Alfa (ARANESP) injection 150 mcg  150 mcg Intravenous Q Fri-HD Debbe Odea, MD       diphenhydrAMINE (BENADRYL) capsule 25 mg  25 mg Oral Q6H PRN Manuella Ghazi, Pratik D, DO       doxercalciferol (HECTOROL) injection 1.5 mcg  1.5 mcg Intravenous Q M,W,F-HD Reesa Chew, MD       enoxaparin (LOVENOX) injection 90 mg  90 mg Subcutaneous Q24H Robertson, Crystal S, RPH       feeding supplement (NEPRO CARB STEADY) liquid 237 mL  237 mL Oral BID BM Debbe Odea, MD   237 mL at 11/29/20 1523   gabapentin (NEURONTIN) capsule 300 mg  300 mg Oral QHS Manuella Ghazi, Pratik D, DO   300 mg at 12/21/2020 2327   heparin injection 1,000 Units  1,000 Units Dialysis PRN Heath Lark D, DO       heparin injection 1,700 Units  20 Units/kg Dialysis PRN Manuella Ghazi, Pratik D, DO       hydrocortisone (ANUSOL-HC) suppository 25 mg  25 mg Rectal BID Heath Lark D, DO   25 mg at 11/29/20 1326   HYDROmorphone (DILAUDID) injection 0.5-1 mg  0.5-1 mg Intravenous Q2H PRN Heath Lark D, DO   1 mg at 11/30/20 0950   insulin detemir (LEVEMIR) injection 16 Units  16 Units Subcutaneous QHS Heath Lark D, DO   16 Units at 11/29/20 2216   labetalol (NORMODYNE) injection 10 mg  10 mg Intravenous Q2H PRN Manuella Ghazi, Pratik D, DO       lidocaine (PF) (XYLOCAINE) 1 % injection 5 mL  5 mL Intradermal PRN Manuella Ghazi, Pratik D, DO       lidocaine-prilocaine (EMLA) cream 1 application  1 application Topical PRN Manuella Ghazi, Pratik D, DO       midodrine (PROAMATINE) tablet 5 mg  5 mg Oral TID WC Manuella Ghazi, Pratik D, DO   5 mg at 11/30/20 0454   multivitamin (RENA-VIT) tablet 1 tablet  1 tablet Oral QHS Debbe Odea, MD       mupirocin ointment (BACTROBAN) 2 % 1 application  1 application Nasal BID Manuella Ghazi, Pratik D, DO   1 application at 09/81/19 0953   ondansetron (ZOFRAN) injection 4 mg  4 mg Intravenous Once Manuella Ghazi, Pratik D, DO       ondansetron (ZOFRAN) tablet 4 mg  4 mg Oral Q6H PRN Manuella Ghazi, Pratik D, DO       Or   ondansetron (ZOFRAN) injection 4 mg   4 mg Intravenous Q6H PRN Manuella Ghazi, Pratik D, DO       oxyCODONE (Oxy IR/ROXICODONE) immediate release tablet 5 mg  5 mg Oral Q6H PRN Manuella Ghazi, Pratik D, DO   5 mg at 11/30/20 0401   pentafluoroprop-tetrafluoroeth (GEBAUERS) aerosol 1 application  1 application Topical PRN Manuella Ghazi,  Pratik D, DO       polyethylene glycol (MIRALAX / GLYCOLAX) packet 17 g  17 g Oral Daily Manuella Ghazi, Pratik D, DO   17 g at 11/28/20 6125   senna-docusate (Senokot-S) tablet 1 tablet  1 tablet Oral BID Heath Lark D, DO   1 tablet at 11/28/20 4832   sevelamer carbonate (RENVELA) tablet 2,400 mg  2,400 mg Oral TID with meals Manuella Ghazi, Pratik D, DO   2,400 mg at 11/29/20 1525   sodium chloride flush (NS) 0.9 % injection 3 mL  3 mL Intravenous Q12H Shah, Pratik D, DO   3 mL at 11/30/20 3468   sodium chloride flush (NS) 0.9 % injection 3 mL  3 mL Intravenous PRN Heath Lark D, DO          Review of Systems: 10 systems reviewed and negative except per interval history/subjective  Physical Exam: Vitals:   11/30/20 0556 11/30/20 0942  BP: (!) 147/64 (!) 156/66  Pulse: 70 72  Resp: 16 20  Temp: 98.6 F (37 C) 98 F (36.7 C)  SpO2: 97% 95%   Total I/O In: 50 [P.O.:50] Out: -   Intake/Output Summary (Last 24 hours) at 11/30/2020 8737 Last data filed at 11/30/2020 0841 Gross per 24 hour  Intake 290 ml  Output 2320 ml  Net -2030 ml   Constitutional: Chronically ill-appearing, no distress ENMT: ears and nose without scars or lesions, MMM CV: normal rate, no obvious murmur Respiratory: Bilateral chest rise normal work of breathing Gastrointestinal: soft, non-tender, no palpable masses or hernias Skin: Extensive necrosis of the left hand, scattered abrasions, no other rashes Psych: Patient is tired but awake and alert and answers questions appropriately Extremities: 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

## 2020-11-30 NOTE — Progress Notes (Signed)
PROGRESS NOTE    Christy Zhang   [name]  DOB: 1955-12-08  DOA: 12/21/2020 PCP: Claretta Fraise, MD   Brief Narrative:  Christy Zhang is a 65 old female with history of bilateral BKA, ESRD on dialysis on Monday/Wednesday/Friday, type 2 diabetes, hypertension, peripheral vascular disease, hyperlipidemia, recently discharged from here on 11/14/2020 after prolonged hospitalization when she was admitted with altered mental status and had to be intubated for airway protection and also underwent CRRT in the setting of septic shock from pneumonia causing acute respiratory failure with hypoxia, AKI.  She presented at [facility] hospital ED because it was too painful to sit down for dialysis due to her decubitus ulcer and she also missed 2 session of dialysis.  Patient was sent to [facility] from [facility] for further management.  Nephrology,orthopedics,palliative care vascular surgery consulted.   Subjective: No complaints. - was very restless during MRI yesterday.    Assessment & Plan:   Principal Problem:   Gangrene of left hand - s/p thrombectomy of left radial artery thrombotic occlusion - vascular surgery has been following   Active Problems: S/P BKA (below knee amputation) bilateral (HCC) Wound on b/l stumps- - vascular surgery recommending b/l AKA  Sacral and right hip decubitus ulcers - general surgery does not recommend debridement but has recommended hydrotherapy and santyl  - she is unable to sit for dialysis    Obesity, Class III, BMI 40-49.9 (morbid obesity) (HCC)     Paroxysmal atrial fibrillation (HCC) - cont amiodarone- eliquis on hold    ESRD (end stage renal disease) on dialysis (HCC)  AOCD - appreciate nephro f/u  Left renal mass - MRI abdomen attempted  x 3 and then cancelled by MRI dept  DM2 - cont Levemir  Time spent in minutes: 35 DVT prophylaxis: holding Code Status: partial code Family Communication:  Level of Care: Level of care: Telemetry  Medical Disposition Plan:  Status is: Inpatient  Remains inpatient appropriate because:IV treatments appropriate due to intensity of illness or inability to take PO  Dispo: The patient is from: Home              Anticipated d/c is to:  TBD              Patient currently is not medically stable to d/c.   Difficult to place patient No   Consultants:  Nephrology Gen surgery Vascular surgery Procedures:   Antimicrobials:  Anti-infectives (From admission, onward)    None        Objective: Vitals:   11/29/20 1802 11/30/20 0100 11/30/20 0556 11/30/20 0942  BP: (!) 147/53 138/64 (!) 147/64 (!) 156/66  Pulse: 79 87 70 72  Resp: '18 19 16 20  '$ Temp: 98.6 F (37 C) 98.5 F (36.9 C) 98.6 F (37 C) 98 F (36.7 C)  TempSrc:  Oral Axillary Oral  SpO2: 93% 94% 97% 95%  Weight:      Height:        Intake/Output Summary (Last 24 hours) at 11/30/2020 1520 Last data filed at 11/30/2020 0841 Gross per 24 hour  Intake 290 ml  Output --  Net 290 ml    Filed Weights   12/23/2020 1001 12/06/2020 1720 11/28/20 0517  Weight: 86.2 kg 86.2 kg 89.8 kg    Examination: General exam: Appears comfortable  HEENT: PERRLA, oral mucosa moist, no sclera icterus or thrush Respiratory system: Clear to auscultation. Respiratory effort normal. Cardiovascular system: S1 & S2 heard, regular rate and rhythm Gastrointestinal system: Abdomen  soft, non-tender, nondistended. Normal bowel sounds   Central nervous system: Alert and oriented. No focal neurological deficits. Extremities: No cyanosis, clubbing or edema Skin: No rashes or ulcers Psychiatry:  Mood & affect appropriate.      Data Reviewed: I have personally reviewed following labs and imaging studies  CBC: Recent Labs  Lab 11/23/20 2221 12/02/2020 1021 12/27/2020 1513 11/28/20 0532 11/29/20 0927  WBC 22.8* 23.6* 24.9* 22.4* 25.8*  NEUTROABS 18.6* 20.5*  --   --  22.3*  HGB 7.9* 8.3* 8.0* 7.5* 7.0*  HCT 25.2* 26.9* 25.9* 24.7* 22.2*  MCV  78.5* 76.4* 76.9* 75.5* 75.5*  PLT 770* 737* 735* 703* 682*    Basic Metabolic Panel: Recent Labs  Lab 11/30/2020 1021 12/04/2020 1513 11/28/20 0532 11/29/20 0926 11/30/20 0316  NA 132* 130* 133* 131* 131*  K 5.8* 5.9* 4.5 4.3 5.5*  CL 94* 92* 90* 92* 93*  CO2 '22 22 27 24 '$ 19*  GLUCOSE 126* 124* 70 120* 120*  BUN 81* 83* 40* 54* 36*  CREATININE 13.10* 13.93* 8.16* 9.71* 6.62*  CALCIUM 8.1* 7.9* 8.8* 8.0* 8.9  MG  --   --  2.0  --   --   PHOS  --  9.7*  --  6.4* 4.6    GFR: Estimated Creatinine Clearance: 9.4 mL/min (A) (by C-G formula based on SCr of 6.62 mg/dL (H)). Liver Function Tests: Recent Labs  Lab 11/23/20 2221 12/26/2020 1021 12/08/2020 1513 11/29/20 0926 11/30/20 0316  AST 13* 12*  --   --   --   ALT 10 10  --   --   --   ALKPHOS 68 79  --   --   --   BILITOT 0.7 1.0  --   --   --   PROT 6.8 7.2  --   --   --   ALBUMIN 2.0* 2.1* 2.1* 1.8* 2.0*    Recent Labs  Lab 11/23/20 2221  LIPASE 20    No results for input(s): AMMONIA in the last 168 hours. Coagulation Profile: Recent Labs  Lab 11/23/20 2221  INR 2.7*    Cardiac Enzymes: No results for input(s): CKTOTAL, CKMB, CKMBINDEX, TROPONINI in the last 168 hours. BNP (last 3 results) No results for input(s): PROBNP in the last 8760 hours. HbA1C: No results for input(s): HGBA1C in the last 72 hours. CBG: Recent Labs  Lab 11/28/20 0131 11/28/20 0639 11/28/20 2143 11/29/20 0640 11/29/20 2215  GLUCAP 96 72 164* 153* 148*    Lipid Profile: No results for input(s): CHOL, HDL, LDLCALC, TRIG, CHOLHDL, LDLDIRECT in the last 72 hours. Thyroid Function Tests: No results for input(s): TSH, T4TOTAL, FREET4, T3FREE, THYROIDAB in the last 72 hours. Anemia Panel: No results for input(s): VITAMINB12, FOLATE, FERRITIN, TIBC, IRON, RETICCTPCT in the last 72 hours. Urine analysis:    Component Value Date/Time   COLORURINE AMBER (A) 10/28/2020 1322   APPEARANCEUR TURBID (A) 10/28/2020 1322   LABSPEC 1.014  10/28/2020 1322   PHURINE 5.0 10/28/2020 1322   GLUCOSEU NEGATIVE 10/28/2020 1322   HGBUR MODERATE (A) 10/28/2020 1322   BILIRUBINUR NEGATIVE 10/28/2020 1322   KETONESUR NEGATIVE 10/28/2020 1322   PROTEINUR 100 (A) 10/28/2020 1322   NITRITE NEGATIVE 10/28/2020 1322   LEUKOCYTESUR LARGE (A) 10/28/2020 1322   Sepsis Labs: '@LABRCNTIP'$ (procalcitonin:4,lacticidven:4) ) Recent Results (from the past 240 hour(s))  Resp Panel by RT-PCR (Flu A&B, Covid) Nasopharyngeal Swab     Status: None   Collection Time: 11/23/20 10:20 PM   Specimen: Nasopharyngeal  Swab; Nasopharyngeal(NP) swabs in vial transport medium  Result Value Ref Range Status   SARS Coronavirus 2 by RT PCR NEGATIVE NEGATIVE Final    Comment: (NOTE) SARS-CoV-2 target nucleic acids are NOT DETECTED.  The SARS-CoV-2 RNA is generally detectable in upper respiratory specimens during the acute phase of infection. The lowest concentration of SARS-CoV-2 viral copies this assay can detect is 138 copies/mL. A negative result does not preclude SARS-Cov-2 infection and should not be used as the sole basis for treatment or other patient management decisions. A negative result may occur with  improper specimen collection/handling, submission of specimen other than nasopharyngeal swab, presence of viral mutation(s) within the areas targeted by this assay, and inadequate number of viral copies(<138 copies/mL). A negative result must be combined with clinical observations, patient history, and epidemiological information. The expected result is Negative.  Fact Sheet for Patients:  EntrepreneurPulse.com.au  Fact Sheet for Healthcare Providers:  IncredibleEmployment.be  This test is no t yet approved or cleared by the [place] FDA and  has been authorized for detection and/or diagnosis of SARS-CoV-2 by FDA under an Emergency Use Authorization (EUA). This EUA will remain  in effect (meaning this test  can be used) for the duration of the COVID-19 declaration under Section 564(b)(1) of the Act, 21 U.S.C.section 360bbb-3(b)(1), unless the authorization is terminated  or revoked sooner.       Influenza A by PCR NEGATIVE NEGATIVE Final   Influenza B by PCR NEGATIVE NEGATIVE Final    Comment: (NOTE) The Xpert Xpress SARS-CoV-2/FLU/RSV plus assay is intended as an aid in the diagnosis of influenza from Nasopharyngeal swab specimens and should not be used as a sole basis for treatment. Nasal washings and aspirates are unacceptable for Xpert Xpress SARS-CoV-2/FLU/RSV testing.  Fact Sheet for Patients: EntrepreneurPulse.com.au  Fact Sheet for Healthcare Providers: IncredibleEmployment.be  This test is not yet approved or cleared by the [place] FDA and has been authorized for detection and/or diagnosis of SARS-CoV-2 by FDA under an Emergency Use Authorization (EUA). This EUA will remain in effect (meaning this test can be used) for the duration of the COVID-19 declaration under Section 564(b)(1) of the Act, 21 U.S.C. section 360bbb-3(b)(1), unless the authorization is terminated or revoked.  Performed at [facility], [address], [place], [place] [address]   Culture, blood (routine x 2)     Status: None   Collection Time: 11/23/20 10:22 PM   Specimen: BLOOD LEFT FOREARM  Result Value Ref Range Status   Specimen Description BLOOD LEFT FOREARM  Final   Special Requests   Final    Blood Culture adequate volume BOTTLES DRAWN AEROBIC AND ANAEROBIC   Culture   Final    NO GROWTH 6 DAYS Performed at [facility], [address], [place],  [address]    Report Status 11/29/2020 FINAL  Final  Resp Panel by RT-PCR (Flu A&B, Covid) Nasopharyngeal Swab     Status: None   Collection Time: 12/14/2020 10:26 AM   Specimen: Nasopharyngeal Swab; Nasopharyngeal(NP) swabs in vial transport medium  Result Value Ref Range Status   SARS Coronavirus 2  by RT PCR NEGATIVE NEGATIVE Final    Comment: (NOTE) SARS-CoV-2 target nucleic acids are NOT DETECTED.  The SARS-CoV-2 RNA is generally detectable in upper respiratory specimens during the acute phase of infection. The lowest concentration of SARS-CoV-2 viral copies this assay can detect is 138 copies/mL. A negative result does not preclude SARS-Cov-2 infection and should not be used as the sole basis  for treatment or other patient management decisions. A negative result may occur with  improper specimen collection/handling, submission of specimen other than nasopharyngeal swab, presence of viral mutation(s) within the areas targeted by this assay, and inadequate number of viral copies(<138 copies/mL). A negative result must be combined with clinical observations, patient history, and epidemiological information. The expected result is Negative.  Fact Sheet for Patients:  EntrepreneurPulse.com.au  Fact Sheet for Healthcare Providers:  IncredibleEmployment.be  This test is no t yet approved or cleared by the [place] FDA and  has been authorized for detection and/or diagnosis of SARS-CoV-2 by FDA under an Emergency Use Authorization (EUA). This EUA will remain  in effect (meaning this test can be used) for the duration of the COVID-19 declaration under Section 564(b)(1) of the Act, 21 U.S.C.section 360bbb-3(b)(1), unless the authorization is terminated  or revoked sooner.       Influenza A by PCR NEGATIVE NEGATIVE Final   Influenza B by PCR NEGATIVE NEGATIVE Final    Comment: (NOTE) The Xpert Xpress SARS-CoV-2/FLU/RSV plus assay is intended as an aid in the diagnosis of influenza from Nasopharyngeal swab specimens and should not be used as a sole basis for treatment. Nasal washings and aspirates are unacceptable for Xpert Xpress SARS-CoV-2/FLU/RSV testing.  Fact Sheet for Patients: EntrepreneurPulse.com.au  Fact  Sheet for Healthcare Providers: IncredibleEmployment.be  This test is not yet approved or cleared by the [place] FDA and has been authorized for detection and/or diagnosis of SARS-CoV-2 by FDA under an Emergency Use Authorization (EUA). This EUA will remain in effect (meaning this test can be used) for the duration of the COVID-19 declaration under Section 564(b)(1) of the Act, 21 U.S.C. section 360bbb-3(b)(1), unless the authorization is terminated or revoked.  Performed at [facility], [address] [place], [place] [address]   Blood culture (routine x 2)     Status: None (Preliminary result)   Collection Time: 12/16/2020 10:45 AM   Specimen: BLOOD  Result Value Ref Range Status   Specimen Description BLOOD LEFT ARM  Final   Special Requests   Final    BOTTLES DRAWN AEROBIC AND ANAEROBIC Blood Culture adequate volume   Culture   Final    NO GROWTH 2 DAYS Performed at [facility], [address], [place], [place] [address]    Report Status PENDING  Incomplete  Blood culture (routine x 2)     Status: None (Preliminary result)   Collection Time: 12/13/2020 12:42 PM   Specimen: Left Antecubital; Blood  Result Value Ref Range Status   Specimen Description LEFT ANTECUBITAL  Final   Special Requests   Final    Blood Culture adequate volume BOTTLES DRAWN AEROBIC AND ANAEROBIC   Culture   Final    NO GROWTH 2 DAYS Performed at [facility], [address] [place], [place] [address]    Report Status PENDING  Incomplete  MRSA Next Gen by PCR, Nasal     Status: Abnormal   Collection Time: 11/28/20  1:41 AM   Specimen: Nasal Mucosa; Nasal Swab  Result Value Ref Range Status   MRSA by PCR Next Gen DETECTED (A) NOT DETECTED Final    Comment: RESULT CALLED TO, READ BACK BY AND VERIFIED WITH: Starr Sinclair RN, AT [phone] 11/28/20 D. VANHOOK (NOTE) The GeneXpert MRSA Assay (FDA approved for NASAL specimens only), is one component of a comprehensive MRSA colonization  surveillance program. It is not intended to diagnose MRSA infection nor to guide or monitor treatment for MRSA  infections. Test performance is not FDA approved in patients less than 30 years old. Performed at [facility], [place] [address], [place], [place] [address]          Radiology Studies: No results found.    Scheduled Meds:  (feeding supplement) PROSource Plus  30 mL Oral BID BM   amiodarone  200 mg Oral Daily   aspirin EC  81 mg Oral QHS   atorvastatin  40 mg Oral QPM   Chlorhexidine Gluconate Cloth  6 each Topical Q0600   Chlorhexidine Gluconate Cloth  6 each Topical Q0600   collagenase   Topical Daily   [START ON 12/01/2020] darbepoetin (ARANESP) injection - DIALYSIS  150 mcg Intravenous Q Fri-HD   doxercalciferol  1.5 mcg Intravenous Q M,W,F-HD   enoxaparin (LOVENOX) injection  90 mg Subcutaneous Q24H   feeding supplement (NEPRO CARB STEADY)  237 mL Oral BID BM   gabapentin  300 mg Oral QHS   hydrocortisone  25 mg Rectal BID   insulin detemir  16 Units Subcutaneous QHS   midodrine  5 mg Oral TID WC   multivitamin  1 tablet Oral QHS   mupirocin ointment  1 application Nasal BID   ondansetron (ZOFRAN) IV  4 mg Intravenous Once   polyethylene glycol  17 g Oral Daily   senna-docusate  1 tablet Oral BID   sevelamer carbonate  2,400 mg Oral TID with meals   sodium chloride flush  3 mL Intravenous Q12H   Continuous Infusions:  sodium chloride     sodium chloride     sodium chloride       LOS: 3 days      Debbe Odea, MD [facility] Pager: [email or web address] 11/30/2020, 3:20 PM

## 2020-12-01 ENCOUNTER — Ambulatory Visit (INDEPENDENT_AMBULATORY_CARE_PROVIDER_SITE_OTHER): Payer: Medicare Other

## 2020-12-01 DIAGNOSIS — D631 Anemia in chronic kidney disease: Secondary | ICD-10-CM

## 2020-12-01 DIAGNOSIS — Z4781 Encounter for orthopedic aftercare following surgical amputation: Secondary | ICD-10-CM

## 2020-12-01 DIAGNOSIS — I96 Gangrene, not elsewhere classified: Secondary | ICD-10-CM | POA: Diagnosis not present

## 2020-12-01 DIAGNOSIS — I110 Hypertensive heart disease with heart failure: Secondary | ICD-10-CM

## 2020-12-01 DIAGNOSIS — Z89511 Acquired absence of right leg below knee: Secondary | ICD-10-CM

## 2020-12-01 DIAGNOSIS — N186 End stage renal disease: Secondary | ICD-10-CM | POA: Diagnosis not present

## 2020-12-01 DIAGNOSIS — I509 Heart failure, unspecified: Secondary | ICD-10-CM

## 2020-12-01 DIAGNOSIS — I70201 Unspecified atherosclerosis of native arteries of extremities, right leg: Secondary | ICD-10-CM

## 2020-12-01 DIAGNOSIS — E785 Hyperlipidemia, unspecified: Secondary | ICD-10-CM

## 2020-12-01 DIAGNOSIS — S31000A Unspecified open wound of lower back and pelvis without penetration into retroperitoneum, initial encounter: Secondary | ICD-10-CM

## 2020-12-01 DIAGNOSIS — Z89512 Acquired absence of left leg below knee: Secondary | ICD-10-CM

## 2020-12-01 DIAGNOSIS — M533 Sacrococcygeal disorders, not elsewhere classified: Secondary | ICD-10-CM

## 2020-12-01 DIAGNOSIS — D75839 Thrombocytosis, unspecified: Secondary | ICD-10-CM

## 2020-12-01 DIAGNOSIS — Z6839 Body mass index (BMI) 39.0-39.9, adult: Secondary | ICD-10-CM

## 2020-12-01 DIAGNOSIS — M1990 Unspecified osteoarthritis, unspecified site: Secondary | ICD-10-CM

## 2020-12-01 DIAGNOSIS — G8929 Other chronic pain: Secondary | ICD-10-CM

## 2020-12-01 DIAGNOSIS — Z7189 Other specified counseling: Secondary | ICD-10-CM | POA: Diagnosis not present

## 2020-12-01 DIAGNOSIS — E1151 Type 2 diabetes mellitus with diabetic peripheral angiopathy without gangrene: Secondary | ICD-10-CM

## 2020-12-01 DIAGNOSIS — N2581 Secondary hyperparathyroidism of renal origin: Secondary | ICD-10-CM | POA: Diagnosis not present

## 2020-12-01 DIAGNOSIS — Z515 Encounter for palliative care: Secondary | ICD-10-CM | POA: Diagnosis not present

## 2020-12-01 DIAGNOSIS — E1122 Type 2 diabetes mellitus with diabetic chronic kidney disease: Secondary | ICD-10-CM

## 2020-12-01 DIAGNOSIS — G709 Myoneural disorder, unspecified: Secondary | ICD-10-CM

## 2020-12-01 DIAGNOSIS — Z4801 Encounter for change or removal of surgical wound dressing: Secondary | ICD-10-CM

## 2020-12-01 DIAGNOSIS — J45909 Unspecified asthma, uncomplicated: Secondary | ICD-10-CM

## 2020-12-01 LAB — RENAL FUNCTION PANEL
Albumin: 1.9 g/dL — ABNORMAL LOW (ref 3.5–5.0)
Anion gap: 16 — ABNORMAL HIGH (ref 5–15)
BUN: 42 mg/dL — ABNORMAL HIGH (ref 8–23)
CO2: 22 mmol/L (ref 22–32)
Calcium: 8.7 mg/dL — ABNORMAL LOW (ref 8.9–10.3)
Chloride: 95 mmol/L — ABNORMAL LOW (ref 98–111)
Creatinine, Ser: 7.97 mg/dL — ABNORMAL HIGH (ref 0.44–1.00)
GFR, Estimated: 5 mL/min — ABNORMAL LOW (ref >60)
Glucose, Bld: 99 mg/dL (ref 70–99)
Phosphorus: 6 mg/dL — ABNORMAL HIGH (ref 2.5–4.6)
Potassium: 5.1 mmol/L (ref 3.5–5.1)
Sodium: 133 mmol/L — ABNORMAL LOW (ref 135–145)

## 2020-12-01 LAB — GLUCOSE, CAPILLARY
Glucose-Capillary: 93 mg/dL (ref 70–99)
Glucose-Capillary: 160 mg/dL — ABNORMAL HIGH (ref 70–99)

## 2020-12-01 NOTE — Progress Notes (Signed)
Left hand evaluated today from the perspective of amputation planning, specifically level amputation and timing.  She was drowsy, but arousable and communicative.  She indicates that she uses the hand as a helper hand presently and that there is no pain associated with it.  She is not eager to proceed with amputation.  The hand has extensive areas of mixed necrosis, from full-thickness dry gangrene of the thumb and small finger, blending more proximally with full-thickness necrosis of the dermis, 2 partial-thickness areas of necrosis.  The hyperthenar border in the palm of the hand is relatively warm, as is the dorsal aspect of the hand and proximal portions of the digits.  More distally where the necrosis is more advanced, the temperature is cooler.  The palmar surface of the index long and ring fingers are less involved, and there is some flaking epidermal slough on the dorsum of the digits with intact viable dermis becoming exposed.  There are flexion contractures of the IP joints, but she is able to actively flex the digits to some degree mostly at the MP joints.  She can actively flex and extend there.  There is minimal active movement of the thumb.  The incision on the distal radial aspect of the forearm from her vascular surgery procedure is heavily scabbed and is distal and blends into necrosis of the thumb and thenar complex.  Throughout the gangrene is dry, without drainage or open areas.  I discussed this complicated situation with her.  The vascular insult of the hand was obviously profound.  The thumb is entirely nonsalvageable, neither is most of the small finger nor the distal half of the index.  It is not so clear with the long and ring.  The fully gangrenous areas are clear, but the intermediate zones are slowly undergoing recovery, and unless this becomes infected or painful, allowing for maximum recovery of the intermediate, lesser involved areas would allow for planning for surgical  excision/amputation in a way to maximize residual function.  For instance, if the interior digits could be salvaged the resultant hand would be more functional and an amputation through the hand/carpus or more proximally such as wrist disarticulation.  What further complicates flap design for coverage at this time is the healing surgical wound in the distal forearm, together with the dorsal central midline strip of necrosis.  I think that ultimately the most likely outcome if amputation is performed is wrist disarticulation, or possibly through the carpus.  I will plan to loosely follow the evolution of her wounds to the left hand as she may transition between inpatient and outpatient status over the next 2 to 4 weeks.  Should infection concerns related to the left hand arise, I would be happy to reevaluate her.  Milly Jakob, MD Hand Surgery

## 2020-12-01 NOTE — Care Management Important Message (Signed)
Important Message  Patient Details  Name: Christy Zhang MRN: [id number] Date of Birth: 07-Mar-1956   Medicare Important Message Given:  Yes     [name] 12/01/2020, 10:28 AM

## 2020-12-01 NOTE — Progress Notes (Signed)
Patient ID: Christy Zhang, female   DOB: [birth year], 65 y.o.   MRN: [id number]  [facility] Progress Note   Assessment/ Plan:   1.  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  Management challenges noted with patient requiring this hand for support as well as not being too keen with surgery at present. 2. ESRD: Continue hemodialysis on a Monday/Wednesday/Friday schedule via left upper arm AV fistula.  Unfortunately, ESRD poses a challenge to subsequent placement to LTAC (unable to discharge to outpatient dialysis unit at this time because of the wounds in her back and the inability for prolonged sitting along with significant debilitation/deconditioning). 3. Anemia: Secondary to end-stage renal disease as well as malnutrition-inflammation complex from multiple wounds on her back/limb gangrene. 4. CKD-MBD: Phosphorus level elevated with ongoing sevelamer, continue Hectorol for PTH control. 5. Nutrition: Albumin level significantly depressed secondary to multifocal sites of inflammation.  Continue ONS. 6. Hypertension: Blood pressure currently at acceptable range, monitor with HD/UF.  Subjective:   Reports multifocal areas of discomfort.   Objective:   BP (!) 132/57 (BP Location: Left Arm)   Pulse 67   Temp 99.3 F (37.4 C) (Oral)   Resp 17   Ht '5\' 4"'$  (1.626 m)   Wt 89.8 kg   LMP  (LMP Unknown)   SpO2 94%   BMI 33.98 kg/m   Physical Exam: Gen: Laying on her left side and getting dressing changes of her back CVS: Pulse regular rhythm, mild tachycardia, S1 and S2 with ejection systolic murmur Resp: Diminished breath sounds over bases, no rales/rhonchi Abd: Soft, obese, nontender Ext: Status post bilateral BKA, RUA AVF  Labs: BMET Recent Labs  Lab 12/14/2020 1021 12/04/2020 1513 11/28/20 0532 11/29/20 0926  11/30/20 0316 12/01/20 0309  NA 132* 130* 133* 131* 131* 133*  K 5.8* 5.9* 4.5 4.3 5.5* 5.1  CL 94* 92* 90* 92* 93* 95*  CO2 '22 22 27 24 '$ 19* 22  GLUCOSE 126* 124* 70 120* 120* 99  BUN 81* 83* 40* 54* 36* 42*  CREATININE 13.10* 13.93* 8.16* 9.71* 6.62* 7.97*  CALCIUM 8.1* 7.9* 8.8* 8.0* 8.9 8.7*  PHOS  --  9.7*  --  6.4* 4.6 6.0*   CBC Recent Labs  Lab 12/08/2020 1021 12/17/2020 1513 11/28/20 0532 11/29/20 0927  WBC 23.6* 24.9* 22.4* 25.8*  NEUTROABS 20.5*  --   --  22.3*  HGB 8.3* 8.0* 7.5* 7.0*  HCT 26.9* 25.9* 24.7* 22.2*  MCV 76.4* 76.9* 75.5* 75.5*  PLT 737* 735* 703* 682*      Medications:     (feeding supplement) PROSource Plus  30 mL Oral BID BM   amiodarone  200 mg Oral Daily   aspirin EC  81 mg Oral QHS   atorvastatin  40 mg Oral QPM   Chlorhexidine Gluconate Cloth  6 each Topical Q0600   Chlorhexidine Gluconate Cloth  6 each Topical Q0600   collagenase   Topical Daily   darbepoetin (ARANESP) injection - DIALYSIS  150 mcg Intravenous Q Fri-HD   doxercalciferol  1.5 mcg Intravenous Q M,W,F-HD   enoxaparin (LOVENOX) injection  90 mg Subcutaneous Q24H   feeding supplement (NEPRO CARB STEADY)  237 mL Oral BID BM   gabapentin  300 mg Oral QHS   hydrocortisone  25 mg Rectal BID   insulin detemir  16 Units Subcutaneous QHS  midodrine  5 mg Oral TID WC   multivitamin  1 tablet Oral QHS   mupirocin ointment  1 application Nasal BID   ondansetron (ZOFRAN) IV  4 mg Intravenous Once   polyethylene glycol  17 g Oral Daily   senna-docusate  1 tablet Oral BID   sevelamer carbonate  2,400 mg Oral TID with meals   sodium chloride flush  3 mL Intravenous Q12H     Elmarie Shiley, MD 12/01/2020, 11:54 AM

## 2020-12-01 NOTE — Progress Notes (Signed)
Daily Progress Note   Patient Name: Christy Zhang       Date: 12/01/2020 DOB: 1955-08-06  Age: 65 y.o. MRN#: [id number] Attending Physician: Christy Hockey, MD Primary Care Physician: Christy Fraise, MD Admit Date: 12/25/2020 Length of Stay: 4 days  Reason for Consultation/Follow-up: Establishing goals of care  HPI/Patient Profile:  65 y.o. female  with past medical history of bilateral BKA, ESRD on dialysis on Monday/Wednesday/Friday, type 2 diabetes, hypertension, peripheral vascular disease, hyperlipidemia, recently discharged from here on 11/14/2020 after prolonged hospitalization when she was admitted with altered mental status and had to be intubated for airway protection and also underwent CRRT in the setting of septic shock from pneumonia causing acute respiratory failure with hypoxia, AKI, left radial occlusion s/p surgical revascularization. She was admitted on 12/02/2020 with painful sacral area due to decubitus unable to sit/tolerate for HD and subsequently missed 2 sessions of dialysis.   During admission noted dry gangrene of bilateral BKAs and left hand (from previous radial occlusion). Hand deemed gangrenous and non-salvageable. Only available treatment is bilateral AKA, which VVS informed the family that would not extend her life. They expressed desire for aggressive measures. General surgery saw the patient for sacral decubitus and does NOT recommend debridement in the OR, rather hydrotherapy/santyl and continued Christy Zhang nursing.  Subjective:   Subjective: Chart Reviewed. Updates received. Patient Assessed. Created space and opportunity for patient  and family to explore thoughts and feelings regarding current medical situation.  Today's Discussion: The patient was sleepy today, but awakens and answers questions. Is scheduled for HD today. States the pain in her sacral area (where wounds are) is better. No N/V. No pain to her necrotic hand or necrosis of the bilateral BKA stumps. No  other complaints today. No family at bedside today.  Review of Systems  Constitutional:  Positive for fatigue.  Respiratory:  Negative for shortness of breath.   Cardiovascular:  Negative for chest pain.  Gastrointestinal:  Negative for abdominal pain, nausea and vomiting.  Musculoskeletal:        Some pain to sacral area, improved No hand pain or pain of bilateral BKA stumps  Skin:  Positive for color change.   Objective:   Vital Signs: BP (!) 132/57 (BP Location: Left Arm)   Pulse 67   Temp 99.3 F (37.4 C) (Oral)   Resp 17   Ht '5\' 4"'$  (1.626 m)   Wt 89.8 kg   LMP  (LMP Unknown)   SpO2 94%   BMI 33.98 kg/m  SpO2: SpO2: 94 % O2 Device: O2 Device: Nasal Cannula O2 Flow Rate: O2 Flow Rate (L/min): 2.5 L/min  Physical Exam: Physical Exam Vitals and nursing note reviewed.  Constitutional:      General: She is sleeping. She is not in acute distress.    Appearance: She is morbidly obese. She is ill-appearing. She is not toxic-appearing.  HENT:     Head: Normocephalic and atraumatic.  Cardiovascular:     Rate and Rhythm: Normal rate and regular rhythm.  Pulmonary:     Effort: Pulmonary effort is normal.     Breath sounds: Normal breath sounds.  Abdominal:     General: Abdomen is protuberant.     Palpations: Abdomen is soft.  Skin:    General: Skin is warm and dry.  Neurological:     Mental Status: She is easily aroused. She is lethargic.    SpO2: SpO2: 94 % O2 Device:SpO2: 94 % O2 Flow Rate: .O2 Flow Rate (L/min): 2.5 L/min  LBM: Last BM Date: 11/29/20 Baseline Weight: Weight: 86.2 kg Most recent weight: Weight:  (Unable to obtain bed weight)   Palliative Assessment/Data: 30-40%   Assessment & Plan:   Impression:  65 year old female with recent admission for severe illness as detailed above now admitted with sacral pain due to sacral decubitus unable to tolerate sitting for hemodialysis and subsequently missing 2 sessions.  She is a known severe vasculopath.   She has a history of bilateral BKA's which are now presenting with dry gangrene as well as a gangrenous left hand which is not salvageable.  Recommendations for bilateral AKA's, which surgery has made clear to the patient and her family would not prolong her life.  Hand surgeon has evaluated hand and plan to see how much improvement in marginal area, likely would need amputation/wrist disarticulation as outpatient. Medical team has recommended comfort care.  However, the patient and her family desire aggressive measures at this point.  They requested to change CODE STATUS to full code.  There is also some concern for possible renal cell carcinoma noted on CT, patient refused MRI when attempted so that is still up in the air.  There is some question as to whether she would be able to tolerate sitting in the HD chair at discharge so there was som mention of possible need for LTAC placement, but discussions are ongoing and depending on clinical progress.  SUMMARY OF RECOMMENDATIONS   Goals are set Full Code Aggressive treatment Wants all available treatment options  Code Status: Full code  Symptom Management: Per primary team at this time Palliative team is available as needed for symptom management assistance  Prognosis: Unable to determine  Discharge Planning: To Be Determined  Discussed with: Patient, medical team (Dr. Roxan Zhang)  Thank you for allowing [place] to participate in the care of [place] [facility] will continue to support holistically.  Time Total: 25 min  Visit consisted of counseling and education dealing with the complex and emotionally intense issues of symptom management and palliative care in the setting of serious and potentially life-threatening illness. Greater than 50%  of this time was spent counseling and coordinating care related to the above assessment and plan.  Christy Field, NP Palliative Medicine Team  Team Phone # [phone] (Nights/Weekends)   12/01/2020, 12:07 PM

## 2020-12-01 NOTE — Progress Notes (Addendum)
PROGRESS NOTE    Christy Zhang   [id number]  DOB: 15-Oct-1955  DOA: 12/02/2020 PCP: Claretta Fraise, MD   Brief Narrative:  Christy Zhang is a 65 old female with history of bilateral BKA, ESRD on dialysis on Monday/Wednesday/Friday, type 2 diabetes, hypertension, peripheral vascular disease, hyperlipidemia, recently discharged from here on 11/14/2020 after prolonged hospitalization when she was admitted with altered mental status and had to be intubated for airway protection and also underwent CRRT in the setting of septic shock from pneumonia causing acute respiratory failure with hypoxia, AKI.  She presented at [facility] hospital ED because it was too painful to sit down for dialysis due to her decubitus ulcer and she also missed 2 session of dialysis.  Patient was sent to [facility] from [facility] for further management.  Nephrology,orthopedics,palliative care vascular surgery consulted.   Subjective: Therapist at bedside, patient tolerated dressing changes well No fevers -patient / family desire bilateral above knee amputations--vascular surgery plans to do this sometime next week    Assessment & Plan:   Principal Problem:   Gangrene of left hand - s/p thrombectomy of left radial artery thrombotic occlusion -Patient and family reluctant to have left hand amputation at a level of the wrist - vascular surgery has been following   Active Problems:  -S/P BKA (below knee amputation) bilateral (HCC) Wound on b/l stumps-  - vascular surgery recommending b/l AKA--- patient and family agreeable  Sacral and right hip decubitus ulcers - general surgery does not recommend debridement but has recommended hydrotherapy and santyl --- continue dressing changes - she is unable to sit for dialysis    Obesity, Class III, BMI 40-49.9 (morbid obesity) ([place])     Paroxysmal atrial fibrillation (HCC) - cont amiodarone- eliquis on hold    ESRD (end stage renal disease) on dialysis ([place])  AOCD -  appreciate nephro f/u  Left renal mass - MRI abdomen attempted  x 3 and then cancelled by MRI dept  DM2  cont Levemir  Social/Ethics--palliative care consult for goals of care appreciated -Patient remains a full code  Time spent in minutes: 35 DVT prophylaxis: holding Code Status: partial code Family Communication:  Level of Care: Level of care: Telemetry Medical Disposition Plan:  Status is: Inpatient  Remains inpatient appropriate because:IV treatments appropriate due to intensity of illness or inability to take PO  Dispo: The patient is from: Home              Anticipated d/c is to:  TBD              Patient currently is not medically stable to d/c.   Difficult to place patient No   Consultants:  Nephrology Gen surgery Vascular surgery Procedures:   Antimicrobials:  Anti-infectives (From admission, onward)    None        Objective: Vitals:   11/30/20 2136 12/01/20 0508 12/01/20 0935 12/01/20 1815  BP: 140/70 (!) 146/53 (!) 132/57 (!) 173/60  Pulse: 67 69 67 66  Resp: '16 16 17 16  '$ Temp: 98.3 F (36.8 C) 98.2 F (36.8 C) 99.3 F (37.4 C)   TempSrc: Oral Oral Oral   SpO2: 100% 100% 94% 100%  Weight:      Height:       No intake or output data in the 24 hours ending 12/01/20 1959 Filed Weights   12/02/2020 1001 12/06/2020 1720 11/28/20 0517  Weight: 86.2 kg 86.2 kg 89.8 kg    Examination: General exam: Appears comfortable  HEENT: PERRLA, oral mucosa moist, no sclera icterus or thrush Respiratory system: Clear to auscultation. Respiratory effort normal. Cardiovascular system: S1 & S2 heard, regular rate and rhythm Gastrointestinal system: Abdomen soft, non-tender, nondistended. Normal bowel sounds   Central nervous system: Alert and oriented. No focal neurological deficits. Extremities: No cyanosis, clubbing or edema Skin: No rashes or ulcers Psychiatry:  Mood & affect appropriate.      Data Reviewed: I have personally reviewed following labs and  imaging studies  CBC: Recent Labs  Lab 12/01/2020 1021 12/18/2020 1513 11/28/20 0532 11/29/20 0927  WBC 23.6* 24.9* 22.4* 25.8*  NEUTROABS 20.5*  --   --  22.3*  HGB 8.3* 8.0* 7.5* 7.0*  HCT 26.9* 25.9* 24.7* 22.2*  MCV 76.4* 76.9* 75.5* 75.5*  PLT 737* 735* 703* [id number]*   Basic Metabolic Panel: Recent Labs  Lab 12/04/2020 1513 11/28/20 0532 11/29/20 0926 11/30/20 0316 12/01/20 0309  NA 130* 133* 131* 131* 133*  K 5.9* 4.5 4.3 5.5* 5.1  CL 92* 90* 92* 93* 95*  CO2 '22 27 24 '$ 19* 22  GLUCOSE 124* 70 120* 120* 99  BUN 83* 40* 54* 36* 42*  CREATININE 13.93* 8.16* 9.71* 6.62* 7.97*  CALCIUM 7.9* 8.8* 8.0* 8.9 8.7*  MG  --  2.0  --   --   --   PHOS 9.7*  --  6.4* 4.6 6.0*   GFR: Estimated Creatinine Clearance: 7.8 mL/min (A) (by C-G formula based on SCr of 7.97 mg/dL (H)). Liver Function Tests: Recent Labs  Lab 12/04/2020 1021 12/22/2020 1513 11/29/20 0926 11/30/20 0316 12/01/20 0309  AST 12*  --   --   --   --   ALT 10  --   --   --   --   ALKPHOS 79  --   --   --   --   BILITOT 1.0  --   --   --   --   PROT 7.2  --   --   --   --   ALBUMIN 2.1* 2.1* 1.8* 2.0* 1.9*   No results for input(s): LIPASE, AMYLASE in the last 168 hours. No results for input(s): AMMONIA in the last 168 hours. Coagulation Profile: No results for input(s): INR, PROTIME in the last 168 hours. Cardiac Enzymes: No results for input(s): CKTOTAL, CKMB, CKMBINDEX, TROPONINI in the last 168 hours. BNP (last 3 results) No results for input(s): PROBNP in the last 8760 hours. HbA1C: No results for input(s): HGBA1C in the last 72 hours. CBG: Recent Labs  Lab 11/29/20 0640 11/29/20 2215 11/30/20 2134 12/01/20 1333 12/01/20 1809  GLUCAP 153* 148* 110* 93 160*   Lipid Profile: No results for input(s): CHOL, HDL, LDLCALC, TRIG, CHOLHDL, LDLDIRECT in the last 72 hours. Thyroid Function Tests: No results for input(s): TSH, T4TOTAL, FREET4, T3FREE, THYROIDAB in the last 72 hours. Anemia Panel: No  results for input(s): VITAMINB12, FOLATE, FERRITIN, TIBC, IRON, RETICCTPCT in the last 72 hours. Urine analysis:    Component Value Date/Time   COLORURINE AMBER (A) 10/28/2020 1322   APPEARANCEUR TURBID (A) 10/28/2020 1322   LABSPEC 1.014 10/28/2020 1322   PHURINE 5.0 10/28/2020 1322   GLUCOSEU NEGATIVE 10/28/2020 1322   HGBUR MODERATE (A) 10/28/2020 1322   BILIRUBINUR NEGATIVE 10/28/2020 1322   KETONESUR NEGATIVE 10/28/2020 1322   PROTEINUR 100 (A) 10/28/2020 1322   NITRITE NEGATIVE 10/28/2020 1322   LEUKOCYTESUR LARGE (A) 10/28/2020 1322   Sepsis Labs: '@LABRCNTIP'$ (procalcitonin:4,lacticidven:4) ) Recent Results (from the past 240 hour(s))  Resp  Panel by RT-PCR (Flu A&B, Covid) Nasopharyngeal Swab     Status: None   Collection Time: 11/23/20 10:20 PM   Specimen: Nasopharyngeal Swab; Nasopharyngeal(NP) swabs in vial transport medium  Result Value Ref Range Status   SARS Coronavirus 2 by RT PCR NEGATIVE NEGATIVE Final    Comment: (NOTE) SARS-CoV-2 target nucleic acids are NOT DETECTED.  The SARS-CoV-2 RNA is generally detectable in upper respiratory specimens during the acute phase of infection. The lowest concentration of SARS-CoV-2 viral copies this assay can detect is 138 copies/mL. A negative result does not preclude SARS-Cov-2 infection and should not be used as the sole basis for treatment or other patient management decisions. A negative result may occur with  improper specimen collection/handling, submission of specimen other than nasopharyngeal swab, presence of viral mutation(s) within the areas targeted by this assay, and inadequate number of viral copies(<138 copies/mL). A negative result must be combined with clinical observations, patient history, and epidemiological information. The expected result is Negative.  Fact Sheet for Patients:  EntrepreneurPulse.com.au  Fact Sheet for Healthcare Providers:   IncredibleEmployment.be  This test is no t yet approved or cleared by the [place] FDA and  has been authorized for detection and/or diagnosis of SARS-CoV-2 by FDA under an Emergency Use Authorization (EUA). This EUA will remain  in effect (meaning this test can be used) for the duration of the COVID-19 declaration under Section 564(b)(1) of the Act, 21 U.S.C.section 360bbb-3(b)(1), unless the authorization is terminated  or revoked sooner.       Influenza A by PCR NEGATIVE NEGATIVE Final   Influenza B by PCR NEGATIVE NEGATIVE Final    Comment: (NOTE) The Xpert Xpress SARS-CoV-2/FLU/RSV plus assay is intended as an aid in the diagnosis of influenza from Nasopharyngeal swab specimens and should not be used as a sole basis for treatment. Nasal washings and aspirates are unacceptable for Xpert Xpress SARS-CoV-2/FLU/RSV testing.  Fact Sheet for Patients: EntrepreneurPulse.com.au  Fact Sheet for Healthcare Providers: IncredibleEmployment.be  This test is not yet approved or cleared by the [place] FDA and has been authorized for detection and/or diagnosis of SARS-CoV-2 by FDA under an Emergency Use Authorization (EUA). This EUA will remain in effect (meaning this test can be used) for the duration of the COVID-19 declaration under Section 564(b)(1) of the Act, 21 U.S.C. section 360bbb-3(b)(1), unless the authorization is terminated or revoked.  Performed at [facility], [address] [place], [place] [address]   Culture, blood (routine x 2)     Status: None   Collection Time: 11/23/20 10:22 PM   Specimen: BLOOD LEFT FOREARM  Result Value Ref Range Status   Specimen Description BLOOD LEFT FOREARM  Final   Special Requests   Final    Blood Culture adequate volume BOTTLES DRAWN AEROBIC AND ANAEROBIC   Culture   Final    NO GROWTH 6 DAYS Performed at [facility], [address] [place], [place] [address]     Report Status 11/29/2020 FINAL  Final  Resp Panel by RT-PCR (Flu A&B, Covid) Nasopharyngeal Swab     Status: None   Collection Time: 12/24/2020 10:26 AM   Specimen: Nasopharyngeal Swab; Nasopharyngeal(NP) swabs in vial transport medium  Result Value Ref Range Status   SARS Coronavirus 2 by RT PCR NEGATIVE NEGATIVE Final    Comment: (NOTE) SARS-CoV-2 target nucleic acids are NOT DETECTED.  The SARS-CoV-2 RNA is generally detectable in upper respiratory specimens during the acute phase of infection. The lowest concentration of SARS-CoV-2 viral  copies this assay can detect is 138 copies/mL. A negative result does not preclude SARS-Cov-2 infection and should not be used as the sole basis for treatment or other patient management decisions. A negative result may occur with  improper specimen collection/handling, submission of specimen other than nasopharyngeal swab, presence of viral mutation(s) within the areas targeted by this assay, and inadequate number of viral copies(<138 copies/mL). A negative result must be combined with clinical observations, patient history, and epidemiological information. The expected result is Negative.  Fact Sheet for Patients:  EntrepreneurPulse.com.au  Fact Sheet for Healthcare Providers:  IncredibleEmployment.be  This test is no t yet approved or cleared by the [place] FDA and  has been authorized for detection and/or diagnosis of SARS-CoV-2 by FDA under an Emergency Use Authorization (EUA). This EUA will remain  in effect (meaning this test can be used) for the duration of the COVID-19 declaration under Section 564(b)(1) of the Act, 21 U.S.C.section 360bbb-3(b)(1), unless the authorization is terminated  or revoked sooner.       Influenza A by PCR NEGATIVE NEGATIVE Final   Influenza B by PCR NEGATIVE NEGATIVE Final    Comment: (NOTE) The Xpert Xpress SARS-CoV-2/FLU/RSV plus assay is intended as an aid in  the diagnosis of influenza from Nasopharyngeal swab specimens and should not be used as a sole basis for treatment. Nasal washings and aspirates are unacceptable for Xpert Xpress SARS-CoV-2/FLU/RSV testing.  Fact Sheet for Patients: EntrepreneurPulse.com.au  Fact Sheet for Healthcare Providers: IncredibleEmployment.be  This test is not yet approved or cleared by the [place] FDA and has been authorized for detection and/or diagnosis of SARS-CoV-2 by FDA under an Emergency Use Authorization (EUA). This EUA will remain in effect (meaning this test can be used) for the duration of the COVID-19 declaration under Section 564(b)(1) of the Act, 21 U.S.C. section 360bbb-3(b)(1), unless the authorization is terminated or revoked.  Performed at [facility], [address], [place], [place] [address]   Blood culture (routine x 2)     Status: None (Preliminary result)   Collection Time: 12/25/2020 10:45 AM   Specimen: BLOOD  Result Value Ref Range Status   Specimen Description BLOOD LEFT ARM  Final   Special Requests   Final    BOTTLES DRAWN AEROBIC AND ANAEROBIC Blood Culture adequate volume   Culture   Final    NO GROWTH 4 DAYS Performed at [facility], [address], [place], [place] [address]    Report Status PENDING  Incomplete  Blood culture (routine x 2)     Status: None (Preliminary result)   Collection Time: 12/16/2020 12:42 PM   Specimen: Left Antecubital; Blood  Result Value Ref Range Status   Specimen Description LEFT ANTECUBITAL  Final   Special Requests   Final    Blood Culture adequate volume BOTTLES DRAWN AEROBIC AND ANAEROBIC   Culture   Final    NO GROWTH 4 DAYS Performed at [facility], [address], [place], [place] [address]    Report Status PENDING  Incomplete  MRSA Next Gen by PCR, Nasal     Status: Abnormal   Collection Time: 11/28/20  1:41 AM   Specimen: Nasal Mucosa; Nasal Swab  Result Value Ref Range Status   MRSA  by PCR Next Gen DETECTED (A) NOT DETECTED Final    Comment: RESULT CALLED TO, READ BACK BY AND VERIFIED WITH: Starr Sinclair RN, AT [phone] 11/28/20 D. Victoriano Lain (NOTE) The GeneXpert MRSA Assay (FDA approved for NASAL specimens only), is  one component of a comprehensive MRSA colonization surveillance program. It is not intended to diagnose MRSA infection nor to guide or monitor treatment for MRSA infections. Test performance is not FDA approved in patients less than 68 years old. Performed at [facility], [place] [address] [place], [place] [address]          Radiology Studies: No results found.    Scheduled Meds:  (feeding supplement) PROSource Plus  30 mL Oral BID BM   amiodarone  200 mg Oral Daily   aspirin EC  81 mg Oral QHS   atorvastatin  40 mg Oral QPM   Chlorhexidine Gluconate Cloth  6 each Topical Q0600   Chlorhexidine Gluconate Cloth  6 each Topical Q0600   collagenase   Topical Daily   darbepoetin (ARANESP) injection - DIALYSIS  150 mcg Intravenous Q Fri-HD   doxercalciferol  1.5 mcg Intravenous Q M,W,F-HD   enoxaparin (LOVENOX) injection  90 mg Subcutaneous Q24H   feeding supplement (NEPRO CARB STEADY)  237 mL Oral BID BM   gabapentin  300 mg Oral QHS   hydrocortisone  25 mg Rectal BID   insulin detemir  16 Units Subcutaneous QHS   midodrine  5 mg Oral TID WC   multivitamin  1 tablet Oral QHS   mupirocin ointment  1 application Nasal BID   ondansetron (ZOFRAN) IV  4 mg Intravenous Once   polyethylene glycol  17 g Oral Daily   senna-docusate  1 tablet Oral BID   sevelamer carbonate  2,400 mg Oral TID with meals   sodium chloride flush  3 mL Intravenous Q12H   Continuous Infusions:  sodium chloride     sodium chloride     sodium chloride       LOS: 4 days   Roxan Hockey, MD [facility] Pager: [email or web address] 12/01/2020, 7:59 PM

## 2020-12-01 NOTE — Consult Note (Signed)
   [facility] Inpatient Consult   12/01/2020  Christy Zhang 01-01-1956 [id number]  [facility]  Accountable Care Organization [ACO] Patient: Medicare CMS DCE  Patient was screened for [facility] Management services for unplanned readmission is less than 30 days.  Patient is an extreme high risk score patient.. Patient will have the transition of care call conducted by the primary care provider, at  [facility]. This patient is also in an Embedded practice which has reach out for chronic disease management with the Embedded Care Management team.  Electronic medical record reviewed for progress and reviewed Palliative consult as well.  Plan: Notification will be sent to the [facility] Management team when disposition and transition of care needs are better known.  Please contact for further questions,  Natividad Brood, RN BSN [facility] Liaison  [phone] business mobile phone Toll free office [phone]  Fax number: [phone] [email or web address]

## 2020-12-01 NOTE — Progress Notes (Signed)
Goals of care established. Noted patient / family desire bilateral above knee amputations. Will plan to do this electively next week. Will check back in on Monday. Please call for questions.  Yevonne Aline. Stanford Breed, MD [facility] Phone Number: [phone] 12/01/2020 5:02 PM

## 2020-12-02 DIAGNOSIS — I480 Paroxysmal atrial fibrillation: Secondary | ICD-10-CM | POA: Diagnosis not present

## 2020-12-02 DIAGNOSIS — I96 Gangrene, not elsewhere classified: Secondary | ICD-10-CM | POA: Diagnosis not present

## 2020-12-02 DIAGNOSIS — N186 End stage renal disease: Secondary | ICD-10-CM | POA: Diagnosis not present

## 2020-12-02 LAB — COMPREHENSIVE METABOLIC PANEL
ALT: 10 U/L (ref 0–44)
AST: 24 U/L (ref 15–41)
Albumin: 1.4 g/dL — ABNORMAL LOW (ref 3.5–5.0)
Alkaline Phosphatase: 93 U/L (ref 38–126)
Anion gap: 13 (ref 5–15)
BUN: 32 mg/dL — ABNORMAL HIGH (ref 8–23)
CO2: 19 mmol/L — ABNORMAL LOW (ref 22–32)
Calcium: 6.9 mg/dL — ABNORMAL LOW (ref 8.9–10.3)
Chloride: 102 mmol/L (ref 98–111)
Creatinine, Ser: 5.6 mg/dL — ABNORMAL HIGH (ref 0.44–1.00)
GFR, Estimated: 8 mL/min — ABNORMAL LOW (ref >60)
Glucose, Bld: 138 mg/dL — ABNORMAL HIGH (ref 70–99)
Potassium: 4 mmol/L (ref 3.5–5.1)
Sodium: 134 mmol/L — ABNORMAL LOW (ref 135–145)
Total Bilirubin: 0.6 mg/dL (ref 0.3–1.2)
Total Protein: 5.5 g/dL — ABNORMAL LOW (ref 6.5–8.1)

## 2020-12-02 LAB — GLUCOSE, CAPILLARY
Glucose-Capillary: 133 mg/dL — ABNORMAL HIGH (ref 70–99)
Glucose-Capillary: 164 mg/dL — ABNORMAL HIGH (ref 70–99)
Glucose-Capillary: 161 mg/dL — ABNORMAL HIGH (ref 70–99)
Glucose-Capillary: 170 mg/dL — ABNORMAL HIGH (ref 70–99)
Glucose-Capillary: 158 mg/dL — ABNORMAL HIGH (ref 70–99)

## 2020-12-02 LAB — CBC
HCT: 24.8 % — ABNORMAL LOW (ref 36.0–46.0)
HCT: 23.4 % — ABNORMAL LOW (ref 36.0–46.0)
Hemoglobin: 7.4 g/dL — ABNORMAL LOW (ref 12.0–15.0)
Hemoglobin: 7.5 g/dL — ABNORMAL LOW (ref 12.0–15.0)
MCH: 23 pg — ABNORMAL LOW (ref 26.0–34.0)
MCH: 24.3 pg — ABNORMAL LOW (ref 26.0–34.0)
MCHC: 29.8 g/dL — ABNORMAL LOW (ref 30.0–36.0)
MCHC: 32.1 g/dL (ref 30.0–36.0)
MCV: 77 fL — ABNORMAL LOW (ref 80.0–100.0)
MCV: 75.7 fL — ABNORMAL LOW (ref 80.0–100.0)
Platelets: 720 10*3/uL — ABNORMAL HIGH (ref 150–400)
Platelets: 737 10*3/uL — ABNORMAL HIGH (ref 150–400)
RBC: 3.22 MIL/uL — ABNORMAL LOW (ref 3.87–5.11)
RBC: 3.09 MIL/uL — ABNORMAL LOW (ref 3.87–5.11)
RDW: 19.5 % — ABNORMAL HIGH (ref 11.5–15.5)
RDW: 19.6 % — ABNORMAL HIGH (ref 11.5–15.5)
WBC: 45.8 10*3/uL — ABNORMAL HIGH (ref 4.0–10.5)
WBC: 54.5 10*3/uL (ref 4.0–10.5)
nRBC: 0 % (ref 0.0–0.2)
nRBC: 0 % (ref 0.0–0.2)

## 2020-12-02 LAB — CBC WITH DIFFERENTIAL/PLATELET
Abs Immature Granulocytes: 1.26 10*3/uL — ABNORMAL HIGH (ref 0.00–0.07)
Basophils Absolute: 0.1 10*3/uL (ref 0.0–0.1)
Basophils Relative: 0 %
Eosinophils Absolute: 0.1 10*3/uL (ref 0.0–0.5)
Eosinophils Relative: 0 %
HCT: 21.5 % — ABNORMAL LOW (ref 36.0–46.0)
Hemoglobin: 6.4 g/dL — CL (ref 12.0–15.0)
Immature Granulocytes: 3 %
Lymphocytes Relative: 3 %
Lymphs Abs: 1.3 10*3/uL (ref 0.7–4.0)
MCH: 23.4 pg — ABNORMAL LOW (ref 26.0–34.0)
MCHC: 29.8 g/dL — ABNORMAL LOW (ref 30.0–36.0)
MCV: 78.8 fL — ABNORMAL LOW (ref 80.0–100.0)
Monocytes Absolute: 1.9 10*3/uL — ABNORMAL HIGH (ref 0.1–1.0)
Monocytes Relative: 4 %
Neutro Abs: 39.5 10*3/uL — ABNORMAL HIGH (ref 1.7–7.7)
Neutrophils Relative %: 90 %
Platelets: 623 10*3/uL — ABNORMAL HIGH (ref 150–400)
RBC: 2.73 MIL/uL — ABNORMAL LOW (ref 3.87–5.11)
RDW: 19.9 % — ABNORMAL HIGH (ref 11.5–15.5)
WBC: 44.2 10*3/uL — ABNORMAL HIGH (ref 4.0–10.5)
nRBC: 0 % (ref 0.0–0.2)

## 2020-12-02 LAB — RENAL FUNCTION PANEL
Albumin: 1.8 g/dL — ABNORMAL LOW (ref 3.5–5.0)
Anion gap: 13 (ref 5–15)
BUN: 28 mg/dL — ABNORMAL HIGH (ref 8–23)
CO2: 26 mmol/L (ref 22–32)
Calcium: 8.4 mg/dL — ABNORMAL LOW (ref 8.9–10.3)
Chloride: 95 mmol/L — ABNORMAL LOW (ref 98–111)
Creatinine, Ser: 5.87 mg/dL — ABNORMAL HIGH (ref 0.44–1.00)
GFR, Estimated: 8 mL/min — ABNORMAL LOW (ref >60)
Glucose, Bld: 145 mg/dL — ABNORMAL HIGH (ref 70–99)
Phosphorus: 4.3 mg/dL (ref 2.5–4.6)
Potassium: 4.6 mmol/L (ref 3.5–5.1)
Sodium: 134 mmol/L — ABNORMAL LOW (ref 135–145)

## 2020-12-02 LAB — PROTIME-INR
INR: 1.5 — ABNORMAL HIGH (ref 0.8–1.2)
Prothrombin Time: 18.1 seconds — ABNORMAL HIGH (ref 11.4–15.2)

## 2020-12-02 LAB — CULTURE, BLOOD (ROUTINE X 2)
Culture: NO GROWTH
Culture: NO GROWTH
Special Requests: ADEQUATE
Special Requests: ADEQUATE

## 2020-12-02 LAB — HEMOGLOBIN AND HEMATOCRIT, BLOOD
HCT: 24.1 % — ABNORMAL LOW (ref 36.0–46.0)
Hemoglobin: 7.6 g/dL — ABNORMAL LOW (ref 12.0–15.0)

## 2020-12-02 LAB — PREPARE RBC (CROSSMATCH)

## 2020-12-02 LAB — APTT: aPTT: 39 seconds — ABNORMAL HIGH (ref 24–36)

## 2020-12-02 LAB — LACTIC ACID, PLASMA: Lactic Acid, Venous: 1.6 mmol/L (ref 0.5–1.9)

## 2020-12-02 MED ORDER — SODIUM CHLORIDE 0.9 % IV SOLN
2.0000 g | Freq: Once | INTRAVENOUS | Status: AC
  Administered 2020-12-02: 2 g via INTRAVENOUS
  Filled 2020-12-02 (×2): qty 2

## 2020-12-02 MED ORDER — VANCOMYCIN HCL IN DEXTROSE 1-5 GM/200ML-% IV SOLN: 1000.0000 mg | Freq: Once | INTRAVENOUS | Status: DC

## 2020-12-02 MED ORDER — VANCOMYCIN HCL IN DEXTROSE 1-5 GM/200ML-% IV SOLN
1000.0000 mg | INTRAVENOUS | Status: DC
  Administered 2020-12-04: 1000 mg via INTRAVENOUS
  Filled 2020-12-02 (×2): qty 200

## 2020-12-02 MED ORDER — DARBEPOETIN ALFA 150 MCG/0.3ML IJ SOSY: 150.0000 ug | PREFILLED_SYRINGE | INTRAMUSCULAR | Status: DC

## 2020-12-02 MED ORDER — FUROSEMIDE 10 MG/ML IJ SOLN: 20.0000 mg | Freq: Once | INTRAMUSCULAR | Status: DC

## 2020-12-02 MED ORDER — INSULIN ASPART 100 UNIT/ML IJ SOLN
0.0000 [IU] | Freq: Three times a day (TID) | INTRAMUSCULAR | Status: DC
  Administered 2020-12-03 (×2): 3 [IU] via SUBCUTANEOUS
  Administered 2020-12-03 – 2020-12-04 (×2): 2 [IU] via SUBCUTANEOUS
  Administered 2020-12-04 – 2020-12-05 (×4): 3 [IU] via SUBCUTANEOUS
  Administered 2020-12-06: 2 [IU] via SUBCUTANEOUS
  Administered 2020-12-07: 5 [IU] via SUBCUTANEOUS
  Administered 2020-12-07: 8 [IU] via SUBCUTANEOUS
  Administered 2020-12-07 – 2020-12-09 (×6): 5 [IU] via SUBCUTANEOUS
  Administered 2020-12-10: 8 [IU] via SUBCUTANEOUS
  Administered 2020-12-11: 5 [IU] via SUBCUTANEOUS
  Administered 2020-12-11: 11 [IU] via SUBCUTANEOUS
  Administered 2020-12-11: 5 [IU] via SUBCUTANEOUS
  Administered 2020-12-12 (×2): 2 [IU] via SUBCUTANEOUS
  Administered 2020-12-12: 5 [IU] via SUBCUTANEOUS
  Administered 2020-12-13: 3 [IU] via SUBCUTANEOUS
  Administered 2020-12-13: 5 [IU] via SUBCUTANEOUS
  Administered 2020-12-13 – 2020-12-14 (×2): 3 [IU] via SUBCUTANEOUS
  Administered 2020-12-14 – 2020-12-15 (×2): 5 [IU] via SUBCUTANEOUS
  Administered 2020-12-15: 3 [IU] via SUBCUTANEOUS
  Administered 2020-12-15 – 2020-12-16 (×2): 5 [IU] via SUBCUTANEOUS
  Administered 2020-12-16 (×2): 3 [IU] via SUBCUTANEOUS
  Administered 2020-12-17 (×2): 5 [IU] via SUBCUTANEOUS
  Administered 2020-12-18: 3 [IU] via SUBCUTANEOUS

## 2020-12-02 MED ORDER — VANCOMYCIN HCL 1750 MG/350ML IV SOLN
1750.0000 mg | Freq: Once | INTRAVENOUS | Status: AC
  Administered 2020-12-02: 1750 mg via INTRAVENOUS
  Filled 2020-12-02: qty 350

## 2020-12-02 MED ORDER — SODIUM CHLORIDE 0.9 % IV SOLN
1.0000 g | INTRAVENOUS | Status: DC
  Administered 2020-12-03 – 2020-12-06 (×4): 1 g via INTRAVENOUS
  Filled 2020-12-02 (×7): qty 1

## 2020-12-02 MED ORDER — SODIUM CHLORIDE 0.9% IV SOLUTION: Freq: Once | INTRAVENOUS | Status: DC

## 2020-12-02 MED ORDER — METRONIDAZOLE 500 MG/100ML IV SOLN
500.0000 mg | Freq: Three times a day (TID) | INTRAVENOUS | Status: DC
  Administered 2020-12-02 – 2020-12-07 (×15): 500 mg via INTRAVENOUS
  Filled 2020-12-02 (×15): qty 100

## 2020-12-02 MED ORDER — DAKINS (1/4 STRENGTH) 0.125 % EX SOLN
Freq: Every day | CUTANEOUS | Status: AC
  Filled 2020-12-02: qty 473

## 2020-12-02 NOTE — Progress Notes (Signed)
Patient ID: Christy Zhang, female   DOB: 19-Jun-1955, 65 y.o.   MRN: [id number]  [facility] Progress Note   Assessment/ Plan:   1.  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  She has agreed to bilateral above-knee amputations that will be done next week by vascular surgery. 2. ESRD: Continue hemodialysis on a Monday/Wednesday/Friday schedule via left upper arm AV fistula.  Outpatient dialysis unfortunately not possible at this time due to challenges with the wounds on her back/associated pain and inability to sit for prolonged periods of time.  LTAC admission would be warranted. 3. Anemia: Secondary to end-stage renal disease as well as malnutrition-inflammation complex from multiple wounds on her back/limb gangrene. 4. CKD-MBD: Phosphorus level elevated with ongoing sevelamer, continue Hectorol for PTH control. 5. Nutrition: Albumin level significantly depressed secondary to multifocal sites of inflammation.  Continue ONS. 6. Hypertension: Blood pressure currently at acceptable range, monitor with HD/UF.  Subjective:   Reports that she tolerated dialysis yesterday without problems following dressing changes earlier in the day.   Objective:   BP (!) 127/51 (BP Location: Left Arm)   Pulse 80   Temp 99.9 F (37.7 C) (Oral)   Resp 17   Ht '5\' 4"'$  (1.626 m)   Wt 89.8 kg   LMP  (LMP Unknown)   SpO2 99%   BMI 33.98 kg/m   Physical Exam: Gen: Ongoing physical therapy, tolerating without problems. CVS: Pulse regular rhythm, mild tachycardia, S1 and S2 with ejection systolic murmur Resp: Diminished breath sounds over bases, no rales/rhonchi Abd: Soft, obese, nontender Ext: Status post bilateral BKA, RUA AVF with intact dressings  Labs: BMET Recent  Labs  Lab 12/06/2020 1021 12/01/2020 1513 11/28/20 0532 11/29/20 0926 11/30/20 0316 12/01/20 0309 12/02/20 0437  NA 132* 130* 133* 131* 131* 133* 134*  K 5.8* 5.9* 4.5 4.3 5.5* 5.1 4.6  CL 94* 92* 90* 92* 93* 95* 95*  CO2 '22 22 27 24 '$ 19* 22 26  GLUCOSE 126* 124* 70 120* 120* 99 145*  BUN 81* 83* 40* 54* 36* 42* 28*  CREATININE 13.10* 13.93* 8.16* 9.71* 6.62* 7.97* 5.87*  CALCIUM 8.1* 7.9* 8.8* 8.0* 8.9 8.7* 8.4*  PHOS  --  9.7*  --  6.4* 4.6 6.0* 4.3   CBC Recent Labs  Lab 12/26/2020 1021 12/22/2020 1513 11/28/20 0532 11/29/20 0927 12/02/20 0437  WBC 23.6* 24.9* 22.4* 25.8* 45.8*  NEUTROABS 20.5*  --   --  22.3*  --   HGB 8.3* 8.0* 7.5* 7.0* 7.4*  HCT 26.9* 25.9* 24.7* 22.2* 24.8*  MCV 76.4* 76.9* 75.5* 75.5* 77.0*  PLT 737* 735* 703* 682* 720*      Medications:     (feeding supplement) PROSource Plus  30 mL Oral BID BM   amiodarone  200 mg Oral Daily   aspirin EC  81 mg Oral QHS   atorvastatin  40 mg Oral QPM   Chlorhexidine Gluconate Cloth  6 each Topical Q0600   collagenase   Topical Daily   [START ON 12/04/2020] darbepoetin (ARANESP) injection - DIALYSIS  150 mcg Intravenous Q Mon-HD   doxercalciferol  1.5 mcg Intravenous Q M,W,F-HD   enoxaparin (LOVENOX) injection  90 mg Subcutaneous Q24H   feeding supplement (NEPRO CARB STEADY)  237 mL Oral BID BM  gabapentin  300 mg Oral QHS   hydrocortisone  25 mg Rectal BID   insulin detemir  16 Units Subcutaneous QHS   midodrine  5 mg Oral TID WC   multivitamin  1 tablet Oral QHS   mupirocin ointment  1 application Nasal BID   ondansetron (ZOFRAN) IV  4 mg Intravenous Once   polyethylene glycol  17 g Oral Daily   senna-docusate  1 tablet Oral BID   sevelamer carbonate  2,400 mg Oral TID with meals   sodium chloride flush  3 mL Intravenous Q12H   Elmarie Shiley, MD 12/02/2020, 10:33 AM

## 2020-12-02 NOTE — Plan of Care (Signed)
  Problem: Education: Goal: Knowledge of General Education information will improve Description: Including pain rating scale, medication(s)/side effects and non-pharmacologic comfort measures Outcome: Progressing   Problem: Coping: Goal: Level of anxiety will decrease Outcome: Progressing   Problem: Safety: Goal: Ability to remain free from injury will improve Outcome: Progressing   

## 2020-12-02 NOTE — Sepsis Progress Note (Signed)
Sepsis protocol is being monitored by eLink. 

## 2020-12-02 NOTE — Progress Notes (Addendum)
Date and time results received: 12/02/20 16:45 (use smartphrase ".now" to insert current time)  Test: white count Critical Value: 54.5  Name of Provider Notified: MD Debbe Odea @ 1700  Orders Received? Antibiotic Ordered

## 2020-12-02 NOTE — Progress Notes (Addendum)
PROGRESS NOTE    HENESSEY VANBUREN   [id number]  DOB: 1956-01-30  DOA: 12/07/2020 PCP: Claretta Fraise, MD   Brief Narrative:  Christy Zhang is a 28 old female with history of bilateral BKA, ESRD on dialysis on Monday/Wednesday/Friday, type 2 diabetes, hypertension, peripheral vascular disease, hyperlipidemia, recently discharged from here on 11/14/2020 after prolonged hospitalization when she was admitted with altered mental status and had to be intubated for airway protection and also underwent CRRT in the setting of septic shock from pneumonia causing acute respiratory failure with hypoxia, AKI.  She presented at [facility] hospital ED because it was too painful to sit down for dialysis due to her decubitus ulcer and she also missed 2 session of dialysis.  Patient was sent to [facility] from [facility] for further management.  Nephrology,orthopedics,palliative care vascular surgery consulted.   Subjective: No complaints. - very restless again during MRI which could not be completed.     Assessment & Plan:   Principal Problem:   Gangrene of left hand - s/p thrombectomy of left radial artery thrombotic occlusion - vascular surgery has been following   Active Problems:  S/P BKA (below knee amputation) bilateral (HCC) Wound on b/l stumps- Leukocytosis  - vascular surgery recommending b/l AKA and patient in agreement of this- to be performed next week - WBC rising from 25.8> 45.8 > 54- suspect wounds are the cause- possibly is bacteremic - temps 100.3 this AM - start Vanc, Cefepime, Flagyl- obtain blood cultures, UA and Urine culture prior to this  Sacral and right hip decubitus ulcers - general surgery does not recommend debridement but has recommended hydrotherapy and santyl   ESRD- AOCD - cont dialysis per nephrology on M/W/F via LUE fistula - she is unable to sit for dialysis and at this point we are not sure how she will receive outpt dialysis    Obesity, Class III, BMI 40-49.9  (morbid obesity) ([place])  Body mass index is 33.98 kg/m.    Paroxysmal atrial fibrillation (HCC) - cont amiodarone- eliquis on hold   Left renal mass - MRI abdomen attempted  x 4 - I will cancel it now.  DM2 - cont Levemir - will add SSI Hemoglobin A1C    Component Value Date/Time   HGBA1C 6.4 (H) 10/27/2020 1848   HGBA1C 5.4 04/06/2020 1105     Time spent in minutes: 35 DVT prophylaxis: holding Code Status: partial code Family Communication:  Level of Care: Level of care: Telemetry Medical Disposition Plan:  Status is: Inpatient  Remains inpatient appropriate because:IV treatments appropriate due to intensity of illness or inability to take PO  Dispo: The patient is from: Home              Anticipated d/c is to:  TBD              Patient currently is not medically stable to d/c.   Difficult to place patient No   Consultants:  Nephrology Gen surgery Vascular surgery Procedures:   Antimicrobials:  Anti-infectives (From admission, onward)    None        Objective: Vitals:   12/01/20 2335 12/02/20 0103 12/02/20 0526 12/02/20 0924  BP: (!) 132/57 (!) 127/47 (!) 115/50 (!) 127/51  Pulse: 78 80 81 80  Resp: '16 16 18 17  '$ Temp: 99 F (37.2 C) 99.5 F (37.5 C) 100.3 F (37.9 C) 99.9 F (37.7 C)  TempSrc: Oral Oral Oral Oral  SpO2: 99% 98% 96% 99%  Weight:  Height:        Intake/Output Summary (Last 24 hours) at 12/02/2020 1513 Last data filed at 12/02/2020 1006 Gross per 24 hour  Intake 303 ml  Output 3000 ml  Net -2697 ml    Filed Weights   12/16/2020 1001 11/29/2020 1720 11/28/20 0517  Weight: 86.2 kg 86.2 kg 89.8 kg    Examination: General exam: Appears comfortable  HEENT: PERRLA, oral mucosa moist, no sclera icterus or thrush Respiratory system: Clear to auscultation. Respiratory effort normal. Cardiovascular system: S1 & S2 heard, regular rate and rhythm Gastrointestinal system: Abdomen soft, non-tender, nondistended. Normal bowel sounds    Central nervous system: Alert and oriented. No focal neurological deficits. Extremities: No cyanosis, clubbing or edema- b/l BKA wounds noted- gangrene of left hand noted  Skin: No rashes or ulcers Psychiatry:  Mood & affect appropriate.      Data Reviewed: I have personally reviewed following labs and imaging studies  CBC: Recent Labs  Lab 12/11/2020 1021 12/03/2020 1513 11/28/20 0532 11/29/20 0927 12/02/20 0437  WBC 23.6* 24.9* 22.4* 25.8* 45.8*  NEUTROABS 20.5*  --   --  22.3*  --   HGB 8.3* 8.0* 7.5* 7.0* 7.4*  HCT 26.9* 25.9* 24.7* 22.2* 24.8*  MCV 76.4* 76.9* 75.5* 75.5* 77.0*  PLT 737* 735* 703* 682* 720*    Basic Metabolic Panel: Recent Labs  Lab 12/12/2020 1513 11/28/20 0532 11/29/20 0926 11/30/20 0316 12/01/20 0309 12/02/20 0437  NA 130* 133* 131* 131* 133* 134*  K 5.9* 4.5 4.3 5.5* 5.1 4.6  CL 92* 90* 92* 93* 95* 95*  CO2 '22 27 24 '$ 19* 22 26  GLUCOSE 124* 70 120* 120* 99 145*  BUN 83* 40* 54* 36* 42* 28*  CREATININE 13.93* 8.16* 9.71* 6.62* 7.97* 5.87*  CALCIUM 7.9* 8.8* 8.0* 8.9 8.7* 8.4*  MG  --  2.0  --   --   --   --   PHOS 9.7*  --  6.4* 4.6 6.0* 4.3    GFR: Estimated Creatinine Clearance: 10.6 mL/min (A) (by C-G formula based on SCr of 5.87 mg/dL (H)). Liver Function Tests: Recent Labs  Lab 11/30/2020 1021 12/20/2020 1513 11/29/20 0926 11/30/20 0316 12/01/20 0309 12/02/20 0437  AST 12*  --   --   --   --   --   ALT 10  --   --   --   --   --   ALKPHOS 79  --   --   --   --   --   BILITOT 1.0  --   --   --   --   --   PROT 7.2  --   --   --   --   --   ALBUMIN 2.1* 2.1* 1.8* 2.0* 1.9* 1.8*    No results for input(s): LIPASE, AMYLASE in the last 168 hours.  No results for input(s): AMMONIA in the last 168 hours. Coagulation Profile: No results for input(s): INR, PROTIME in the last 168 hours.  Cardiac Enzymes: No results for input(s): CKTOTAL, CKMB, CKMBINDEX, TROPONINI in the last 168 hours. BNP (last 3 results) No results for input(s):  PROBNP in the last 8760 hours. HbA1C: No results for input(s): HGBA1C in the last 72 hours. CBG: Recent Labs  Lab 12/01/20 1333 12/01/20 1809 12/02/20 0101 12/02/20 0732 12/02/20 1137  GLUCAP 93 160* 133* 164* 161*    Lipid Profile: No results for input(s): CHOL, HDL, LDLCALC, TRIG, CHOLHDL, LDLDIRECT in the last 72 hours. Thyroid Function  Tests: No results for input(s): TSH, T4TOTAL, FREET4, T3FREE, THYROIDAB in the last 72 hours. Anemia Panel: No results for input(s): VITAMINB12, FOLATE, FERRITIN, TIBC, IRON, RETICCTPCT in the last 72 hours. Urine analysis:    Component Value Date/Time   COLORURINE AMBER (A) 10/28/2020 1322   APPEARANCEUR TURBID (A) 10/28/2020 1322   LABSPEC 1.014 10/28/2020 1322   PHURINE 5.0 10/28/2020 1322   GLUCOSEU NEGATIVE 10/28/2020 1322   HGBUR MODERATE (A) 10/28/2020 1322   BILIRUBINUR NEGATIVE 10/28/2020 1322   KETONESUR NEGATIVE 10/28/2020 1322   PROTEINUR 100 (A) 10/28/2020 1322   NITRITE NEGATIVE 10/28/2020 1322   LEUKOCYTESUR LARGE (A) 10/28/2020 1322   Sepsis Labs: '@LABRCNTIP'$ (procalcitonin:4,lacticidven:4) ) Recent Results (from the past 240 hour(s))  Resp Panel by RT-PCR (Flu A&B, Covid) Nasopharyngeal Swab     Status: None   Collection Time: 11/23/20 10:20 PM   Specimen: Nasopharyngeal Swab; Nasopharyngeal(NP) swabs in vial transport medium  Result Value Ref Range Status   SARS Coronavirus 2 by RT PCR NEGATIVE NEGATIVE Final    Comment: (NOTE) SARS-CoV-2 target nucleic acids are NOT DETECTED.  The SARS-CoV-2 RNA is generally detectable in upper respiratory specimens during the acute phase of infection. The lowest concentration of SARS-CoV-2 viral copies this assay can detect is 138 copies/mL. A negative result does not preclude SARS-Cov-2 infection and should not be used as the sole basis for treatment or other patient management decisions. A negative result may occur with  improper specimen collection/handling, submission of  specimen other than nasopharyngeal swab, presence of viral mutation(s) within the areas targeted by this assay, and inadequate number of viral copies(<138 copies/mL). A negative result must be combined with clinical observations, patient history, and epidemiological information. The expected result is Negative.  Fact Sheet for Patients:  EntrepreneurPulse.com.au  Fact Sheet for Healthcare Providers:  IncredibleEmployment.be  This test is no t yet approved or cleared by the [place] FDA and  has been authorized for detection and/or diagnosis of SARS-CoV-2 by FDA under an Emergency Use Authorization (EUA). This EUA will remain  in effect (meaning this test can be used) for the duration of the COVID-19 declaration under Section 564(b)(1) of the Act, 21 U.S.C.section 360bbb-3(b)(1), unless the authorization is terminated  or revoked sooner.       Influenza A by PCR NEGATIVE NEGATIVE Final   Influenza B by PCR NEGATIVE NEGATIVE Final    Comment: (NOTE) The Xpert Xpress SARS-CoV-2/FLU/RSV plus assay is intended as an aid in the diagnosis of influenza from Nasopharyngeal swab specimens and should not be used as a sole basis for treatment. Nasal washings and aspirates are unacceptable for Xpert Xpress SARS-CoV-2/FLU/RSV testing.  Fact Sheet for Patients: EntrepreneurPulse.com.au  Fact Sheet for Healthcare Providers: IncredibleEmployment.be  This test is not yet approved or cleared by the [place] FDA and has been authorized for detection and/or diagnosis of SARS-CoV-2 by FDA under an Emergency Use Authorization (EUA). This EUA will remain in effect (meaning this test can be used) for the duration of the COVID-19 declaration under Section 564(b)(1) of the Act, 21 U.S.C. section 360bbb-3(b)(1), unless the authorization is terminated or revoked.  Performed at [facility], [address]  [place], [place] [address]   Culture, blood (routine x 2)     Status: None   Collection Time: 11/23/20 10:22 PM   Specimen: BLOOD LEFT FOREARM  Result Value Ref Range Status   Specimen Description BLOOD LEFT FOREARM  Final   Special Requests   Final    Blood Culture  adequate volume BOTTLES DRAWN AEROBIC AND ANAEROBIC   Culture   Final    NO GROWTH 6 DAYS Performed at [facility], [address] [place], [place] [address]    Report Status 11/29/2020 FINAL  Final  Resp Panel by RT-PCR (Flu A&B, Covid) Nasopharyngeal Swab     Status: None   Collection Time: 12/15/2020 10:26 AM   Specimen: Nasopharyngeal Swab; Nasopharyngeal(NP) swabs in vial transport medium  Result Value Ref Range Status   SARS Coronavirus 2 by RT PCR NEGATIVE NEGATIVE Final    Comment: (NOTE) SARS-CoV-2 target nucleic acids are NOT DETECTED.  The SARS-CoV-2 RNA is generally detectable in upper respiratory specimens during the acute phase of infection. The lowest concentration of SARS-CoV-2 viral copies this assay can detect is 138 copies/mL. A negative result does not preclude SARS-Cov-2 infection and should not be used as the sole basis for treatment or other patient management decisions. A negative result may occur with  improper specimen collection/handling, submission of specimen other than nasopharyngeal swab, presence of viral mutation(s) within the areas targeted by this assay, and inadequate number of viral copies(<138 copies/mL). A negative result must be combined with clinical observations, patient history, and epidemiological information. The expected result is Negative.  Fact Sheet for Patients:  EntrepreneurPulse.com.au  Fact Sheet for Healthcare Providers:  IncredibleEmployment.be  This test is no t yet approved or cleared by the [place] FDA and  has been authorized for detection and/or diagnosis of SARS-CoV-2 by FDA under an Emergency Use Authorization (EUA).  This EUA will remain  in effect (meaning this test can be used) for the duration of the COVID-19 declaration under Section 564(b)(1) of the Act, 21 U.S.C.section 360bbb-3(b)(1), unless the authorization is terminated  or revoked sooner.       Influenza A by PCR NEGATIVE NEGATIVE Final   Influenza B by PCR NEGATIVE NEGATIVE Final    Comment: (NOTE) The Xpert Xpress SARS-CoV-2/FLU/RSV plus assay is intended as an aid in the diagnosis of influenza from Nasopharyngeal swab specimens and should not be used as a sole basis for treatment. Nasal washings and aspirates are unacceptable for Xpert Xpress SARS-CoV-2/FLU/RSV testing.  Fact Sheet for Patients: EntrepreneurPulse.com.au  Fact Sheet for Healthcare Providers: IncredibleEmployment.be  This test is not yet approved or cleared by the [place] FDA and has been authorized for detection and/or diagnosis of SARS-CoV-2 by FDA under an Emergency Use Authorization (EUA). This EUA will remain in effect (meaning this test can be used) for the duration of the COVID-19 declaration under Section 564(b)(1) of the Act, 21 U.S.C. section 360bbb-3(b)(1), unless the authorization is terminated or revoked.  Performed at [facility], [address] [place], [place] [address]   Blood culture (routine x 2)     Status: None   Collection Time: 12/21/2020 10:45 AM   Specimen: BLOOD  Result Value Ref Range Status   Specimen Description BLOOD LEFT ARM  Final   Special Requests   Final    BOTTLES DRAWN AEROBIC AND ANAEROBIC Blood Culture adequate volume   Culture   Final    NO GROWTH 5 DAYS Performed at [facility], [address] [place], [place] [address]    Report Status 12/02/2020 FINAL  Final  Blood culture (routine x 2)     Status: None   Collection Time: 12/20/2020 12:42 PM   Specimen: Left Antecubital; Blood  Result Value Ref Range Status   Specimen Description LEFT ANTECUBITAL  Final   Special  Requests   Final  Blood Culture adequate volume BOTTLES DRAWN AEROBIC AND ANAEROBIC   Culture   Final    NO GROWTH 5 DAYS Performed at [facility], [address], [place], [place] [address]    Report Status 12/02/2020 FINAL  Final  MRSA Next Gen by PCR, Nasal     Status: Abnormal   Collection Time: 11/28/20  1:41 AM   Specimen: Nasal Mucosa; Nasal Swab  Result Value Ref Range Status   MRSA by PCR Next Gen DETECTED (A) NOT DETECTED Final    Comment: RESULT CALLED TO, READ BACK BY AND VERIFIED WITH: Starr Sinclair RN, AT [phone] 11/28/20 D. VANHOOK (NOTE) The GeneXpert MRSA Assay (FDA approved for NASAL specimens only), is one component of a comprehensive MRSA colonization surveillance program. It is not intended to diagnose MRSA infection nor to guide or monitor treatment for MRSA infections. Test performance is not FDA approved in patients less than 59 years old. Performed at [facility], [place] [address], [place], [place] [address]          Radiology Studies: No results found.    Scheduled Meds:  (feeding supplement) PROSource Plus  30 mL Oral BID BM   amiodarone  200 mg Oral Daily   aspirin EC  81 mg Oral QHS   atorvastatin  40 mg Oral QPM   Chlorhexidine Gluconate Cloth  6 each Topical Q0600   collagenase   Topical Daily   [START ON 12/04/2020] darbepoetin (ARANESP) injection - DIALYSIS  150 mcg Intravenous Q Mon-HD   doxercalciferol  1.5 mcg Intravenous Q M,W,F-HD   enoxaparin (LOVENOX) injection  90 mg Subcutaneous Q24H   feeding supplement (NEPRO CARB STEADY)  237 mL Oral BID BM   gabapentin  300 mg Oral QHS   hydrocortisone  25 mg Rectal BID   insulin detemir  16 Units Subcutaneous QHS   midodrine  5 mg Oral TID WC   multivitamin  1 tablet Oral QHS   mupirocin ointment  1 application Nasal BID   ondansetron (ZOFRAN) IV  4 mg Intravenous Once   polyethylene glycol  17 g Oral Daily   senna-docusate  1 tablet Oral BID   sevelamer carbonate  2,400 mg Oral TID  with meals   sodium chloride flush  3 mL Intravenous Q12H   sodium hypochlorite   Irrigation Daily   Continuous Infusions:  sodium chloride     sodium chloride     sodium chloride       LOS: 5 days      Debbe Odea, MD [facility] Pager: [email or web address] 12/02/2020, 3:13 PM

## 2020-12-02 NOTE — Progress Notes (Signed)
Physical Therapy Wound Treatment Patient Details  Name: Christy Zhang MRN: [id number] Date of Birth: [birth year]  Today's Date: 12/02/2020 Time: 1010-1129 Time Calculation (min): 79 min  Subjective  Subjective Assessment Subjective: Lethargic but agreeable to hydrotherapy Patient and Family Stated Goals: None stated Date of Onset:  (Unknown) Prior Treatments:  (Dressing changes)  Pain Score:  Slept through most of the session. Premedicated and tolerated well.   Wound Assessment  Pressure Injury 11/28/20 Sacrum Unstageable - Full thickness tissue loss in which the base of the injury is covered by slough (yellow, tan, gray, green or brown) and/or eschar (tan, brown or black) in the wound bed. (Active)  Dressing Type Barrier Film (skin prep);Foam - Lift dressing to assess site every shift;Gauze (Comment);Moist to moist;Santyl 12/02/20 1400  Dressing Changed;Clean;Dry;Intact 12/02/20 1400  Dressing Change Frequency Daily 12/02/20 1400  State of Healing Eschar 12/02/20 1400  Site / Wound Assessment Black;Pink;Yellow 12/02/20 1400  % Wound base Red or Granulating 25% 12/02/20 1400  % Wound base Yellow/Fibrinous Exudate 25% 12/02/20 1400  % Wound base Black/Eschar 50% 12/02/20 1400  % Wound base Other/Granulation Tissue (Comment) 0% 12/02/20 1400  Peri-wound Assessment Intact 12/02/20 1400  Wound Length (cm) 14 cm 11/29/20 1346  Wound Width (cm) 9 cm 11/29/20 1346  Wound Depth (cm) 1.8 cm 11/29/20 1346  Wound Surface Area (cm^2) 126 cm^2 11/29/20 1346  Wound Volume (cm^3) 226.8 cm^3 11/29/20 1346  Tunneling (cm) 0 11/29/20 1513  Undermining (cm) 0 11/29/20 1513  Margins Unattached edges (unapproximated) 12/02/20 1400  Drainage Amount Moderate 12/02/20 1400  Drainage Description Purulent;Green;Odor 12/02/20 1400  Treatment Debridement (Selective);Hydrotherapy (Pulse lavage);Packing (Saline gauze) 12/02/20 1400     Pressure Injury 11/28/20 Flank Right Unstageable - Full thickness  tissue loss in which the base of the injury is covered by slough (yellow, tan, gray, green or brown) and/or eschar (tan, brown or black) in the wound bed. (Active)  Dressing Type Barrier Film (skin prep);Foam - Lift dressing to assess site every shift;Gauze (Comment);Moist to moist;Santyl 12/02/20 1400  Dressing Changed;Clean;Dry;Intact 12/02/20 1400  Dressing Change Frequency Daily 12/02/20 1400  State of Healing Eschar 12/02/20 1400  Site / Wound Assessment Black;Pink;Yellow 12/02/20 1400  % Wound base Red or Granulating 10% 12/02/20 1400  % Wound base Yellow/Fibrinous Exudate 50% 12/02/20 1400  % Wound base Black/Eschar 40% 12/02/20 1400  % Wound base Other/Granulation Tissue (Comment) 0% 12/02/20 1400  Peri-wound Assessment Intact 12/02/20 1400  Wound Length (cm) 7 cm 11/29/20 1346  Wound Width (cm) 18 cm 11/29/20 1346  Wound Depth (cm) 0.1 cm 11/29/20 1346  Wound Surface Area (cm^2) 126 cm^2 11/29/20 1346  Wound Volume (cm^3) 12.6 cm^3 11/29/20 1346  Tunneling (cm) 0 11/29/20 1513  Undermining (cm) 0 11/29/20 1513  Margins Unattached edges (unapproximated) 12/02/20 1400  Drainage Amount Minimal 12/02/20 1400  Drainage Description Purulent;Green;Odor 12/02/20 1400  Treatment Debridement (Selective);Hydrotherapy (Pulse lavage);Packing (Saline gauze) 12/02/20 1400     Pressure Injury 11/28/20 Hip Right;Lateral Unstageable - Full thickness tissue loss in which the base of the injury is covered by slough (yellow, tan, gray, green or brown) and/or eschar (tan, brown or black) in the wound bed. (Active)  Dressing Type Barrier Film (skin prep);Gauze (Comment);Moist to moist;Foam - Lift dressing to assess site every shift;Santyl 12/02/20 1400  Dressing Changed;Clean;Dry;Intact 12/02/20 1400  Dressing Change Frequency Daily 12/02/20 1400  State of Healing Eschar 12/02/20 1400  Site / Wound Assessment Black;Yellow 12/02/20 1400  % Wound base Red or Granulating 0% 12/01/20  1308  % Wound base  Yellow/Fibrinous Exudate 65% 12/02/20 1400  % Wound base Black/Eschar 35% 12/02/20 1400  % Wound base Other/Granulation Tissue (Comment) 0% 12/02/20 1400  Peri-wound Assessment Intact 12/02/20 1400  Wound Length (cm) 5 cm 11/29/20 1346  Wound Width (cm) 4.5 cm 11/29/20 1346  Wound Depth (cm) 0.1 cm 11/29/20 1346  Wound Surface Area (cm^2) 22.5 cm^2 11/29/20 1346  Wound Volume (cm^3) 2.25 cm^3 11/29/20 1346  Tunneling (cm) 0 11/29/20 1513  Undermining (cm) 0 11/29/20 1513  Margins Unattached edges (unapproximated) 12/02/20 1400  Drainage Amount Scant 12/02/20 1400  Drainage Description Serosanguineous 12/02/20 1400  Treatment Debridement (Selective);Hydrotherapy (Pulse lavage);Packing (Saline gauze) 12/02/20 1400     Pressure Injury 11/28/20 Ischial tuberosity Left Unstageable - Full thickness tissue loss in which the base of the injury is covered by slough (yellow, tan, gray, green or brown) and/or eschar (tan, brown or black) in the wound bed. (Active)  Dressing Type Foam - Lift dressing to assess site every shift 12/02/20 0115  Dressing Changed 11/28/20 1949  Wound Length (cm) 1 cm 11/28/20 0744  Wound Width (cm) 3.9 cm 11/28/20 0744  Wound Surface Area (cm^2) 3.9 cm^2 11/28/20 0744      Hydrotherapy Pulsed lavage therapy - wound location: Sacrum, R hip, and R flank Pulsed Lavage with Suction (psi): 4 psi Pulsed Lavage with Suction - Normal Saline Used: 2000 mL Pulsed Lavage Tip: Tip with splash shield Selective Debridement Selective Debridement - Location: Sacrum, R hip, and R flank Selective Debridement - Tools Used: Forceps, Scalpel, Scissors Selective Debridement - Tissue Removed: Black eschar    Wound Assessment and Plan  Wound Therapy - Assess/Plan/Recommendations Wound Therapy - Clinical Statement: Dressings saturated with blue/green drainage and with worsening odor. Discussed with [name], PA-C regarding addition of Dakin's solution x3 days and she was agreeable, however  has already left for the day. Attending, Dr. Wynelle Cleveland notified of the request. Pt appeared to tolerate treatment with less pain this session. Will continue to benefit from hydrotherapy for selective removal of unviable tissue, to decrease bioburden, and promote wound bed healing. Wound Therapy - Functional Problem List: Decreased tolerance for position changes and OOB Factors Delaying/Impairing Wound Healing: Diabetes Mellitus, Immobility, Multiple medical problems Hydrotherapy Plan: Debridement, Dressing change, Patient/family education, Pulsatile lavage with suction Wound Therapy - Frequency: 6X / week Wound Therapy - Follow Up Recommendations: dressing changes by RN  Wound Therapy Goals- Improve the function of patient's integumentary system by progressing the wound(s) through the phases of wound healing (inflammation - proliferation - remodeling) by: Wound Therapy Goals - Improve the function of patient's integumentary system by progressing the wound(s) through the phases of wound healing by: Decrease Necrotic Tissue to: 20% Decrease Necrotic Tissue - Progress: Progressing toward goal Increase Granulation Tissue to: 80% Increase Granulation Tissue - Progress: Progressing toward goal Goals/treatment plan/discharge plan were made with and agreed upon by patient/family: Yes Time For Goal Achievement: 7 days Wound Therapy - Potential for Goals: Good  Goals will be updated until maximal potential achieved or discharge criteria met.  Discharge criteria: when goals achieved, discharge from hospital, MD decision/surgical intervention, no progress towards goals, refusal/missing three consecutive treatments without notification or medical reason.  GP     Charges PT Wound Care Charges $Wound Debridement up to 20 cm: < or equal to 20 cm $ Wound Debridement each add'l 20 sqcm: 9 $PT PLS Gun and Tip: 1 Supply $PT Hydrotherapy Visit: 2 Visits       Clarene Duke  Kalyn Hofstra 12/02/2020, 2:44 PM  Rolinda Roan, PT, DPT Acute Rehabilitation Services Pager: [phone] Office: [phone]

## 2020-12-02 NOTE — Progress Notes (Signed)
Pharmacy Antibiotic Note  Christy Zhang is a 65 y.o. female admitted on 12/24/2020 with  infection of unknown source .  Pharmacy has been consulted for Cefepime and vancomycin dosing.  WBC up to 54.5. On MWF HD   Plan: -Start cefepime 1 gm IV Q 24 hours -Vancomycin 1750 mg IV load followed by vancomycin 1 gm IV Q HD on MWF  -Monitor CBC, cultures and clinical progress -Vanc levels as indicated  -Flagyl per MD  Height: '5\' 4"'$  (162.6 cm) Weight:  (unable to obtain) IBW/kg (Calculated) : 54.7  Temp (24hrs), Avg:99.4 F (37.4 C), Min:98.8 F (37.1 C), Max:100.3 F (37.9 C)  Recent Labs  Lab 11/30/2020 1513 11/28/20 0532 11/29/20 0926 11/29/20 0927 11/30/20 0316 12/01/20 0309 12/02/20 0437 12/02/20 1529  WBC 24.9* 22.4*  --  25.8*  --   --  45.8* 54.5*  CREATININE 13.93* 8.16* 9.71*  --  6.62* 7.97* 5.87*  --     Estimated Creatinine Clearance: 10.6 mL/min (A) (by C-G formula based on SCr of 5.87 mg/dL (H)).    Allergies  Allergen Reactions   Sulfa Antibiotics Swelling    Antimicrobials this admission: Cefepime 8/6 >>  Vancomycin 8/6 >>  Flagyl 8/6 >>   Dose adjustments this admission:   Microbiology results: 8/6 BCx:   Thank you for allowing pharmacy to be a part of this patient's care.  Albertina Parr, PharmD., BCPS, BCCCP Clinical Pharmacist Please refer to [facility] for unit-specific pharmacist

## 2020-12-02 NOTE — Plan of Care (Addendum)
Patient remains lethargic. S/S of Sepsis are present in the setting of multiple non-healing wounds and ESRD. PRBC were ordered, however, repeat H+H was reassuring. No PRBC transfusion per MD.    Problem: Education: Goal: Knowledge of General Education information will improve Description: Including pain rating scale, medication(s)/side effects and non-pharmacologic comfort measures Outcome: Not Progressing   Problem: Health Behavior/Discharge Planning: Goal: Ability to manage health-related needs will improve Outcome: Not Progressing   Problem: Clinical Measurements: Goal: Ability to maintain clinical measurements within normal limits will improve Outcome: Not Progressing Goal: Will remain free from infection Outcome: Not Progressing Goal: Diagnostic test results will improve Outcome: Not Progressing Goal: Respiratory complications will improve Outcome: Not Progressing Goal: Cardiovascular complication will be avoided Outcome: Not Progressing   Problem: Activity: Goal: Risk for activity intolerance will decrease Outcome: Not Progressing   Problem: Nutrition: Goal: Adequate nutrition will be maintained Outcome: Not Progressing   Problem: Coping: Goal: Level of anxiety will decrease Outcome: Not Progressing   Problem: Elimination: Goal: Will not experience complications related to bowel motility Outcome: Not Progressing Goal: Will not experience complications related to urinary retention Outcome: Not Progressing   Problem: Safety: Goal: Ability to remain free from injury will improve Outcome: Not Progressing   Problem: Skin Integrity: Goal: Risk for impaired skin integrity will decrease Outcome: Not Progressing

## 2020-12-02 NOTE — Progress Notes (Addendum)
Hgb 6.4. Patient does not make urine. Next HD in 2 days. Symptomatic anemia with somnolence. Will transfuse 1 unit. No lasix with transfusion as patient does not make urine. Have notified nephro via secure chat.  Nephro advised to proceed with transfusion if needed. Confirmation HH showed hgb 7.6. holding off on transfusion for now.

## 2020-12-02 NOTE — Progress Notes (Signed)
Antibiotics were given prior to blood cultures being drawn. Cultures were ordered at 1707 hrs and as of 2056 hrs still have not been collected. Patient has limited venous access. Cal placed to lab; awaiting phlebotomy to collect cultures.

## 2020-12-03 DIAGNOSIS — N186 End stage renal disease: Secondary | ICD-10-CM | POA: Diagnosis not present

## 2020-12-03 DIAGNOSIS — I96 Gangrene, not elsewhere classified: Secondary | ICD-10-CM | POA: Diagnosis not present

## 2020-12-03 DIAGNOSIS — I480 Paroxysmal atrial fibrillation: Secondary | ICD-10-CM | POA: Diagnosis not present

## 2020-12-03 LAB — RENAL FUNCTION PANEL
Albumin: 1.7 g/dL — ABNORMAL LOW (ref 3.5–5.0)
Anion gap: 16 — ABNORMAL HIGH (ref 5–15)
BUN: 44 mg/dL — ABNORMAL HIGH (ref 8–23)
CO2: 23 mmol/L (ref 22–32)
Calcium: 8.5 mg/dL — ABNORMAL LOW (ref 8.9–10.3)
Chloride: 95 mmol/L — ABNORMAL LOW (ref 98–111)
Creatinine, Ser: 7.31 mg/dL — ABNORMAL HIGH (ref 0.44–1.00)
GFR, Estimated: 6 mL/min — ABNORMAL LOW (ref >60)
Glucose, Bld: 154 mg/dL — ABNORMAL HIGH (ref 70–99)
Phosphorus: 5.7 mg/dL — ABNORMAL HIGH (ref 2.5–4.6)
Potassium: 5.4 mmol/L — ABNORMAL HIGH (ref 3.5–5.1)
Sodium: 134 mmol/L — ABNORMAL LOW (ref 135–145)

## 2020-12-03 LAB — LACTIC ACID, PLASMA: Lactic Acid, Venous: 1.4 mmol/L (ref 0.5–1.9)

## 2020-12-03 LAB — CBC
HCT: 24.7 % — ABNORMAL LOW (ref 36.0–46.0)
Hemoglobin: 7.6 g/dL — ABNORMAL LOW (ref 12.0–15.0)
MCH: 23.9 pg — ABNORMAL LOW (ref 26.0–34.0)
MCHC: 30.8 g/dL (ref 30.0–36.0)
MCV: 77.7 fL — ABNORMAL LOW (ref 80.0–100.0)
Platelets: 770 10*3/uL — ABNORMAL HIGH (ref 150–400)
RBC: 3.18 MIL/uL — ABNORMAL LOW (ref 3.87–5.11)
RDW: 19.9 % — ABNORMAL HIGH (ref 11.5–15.5)
WBC: 59.5 10*3/uL (ref 4.0–10.5)
nRBC: 0 % (ref 0.0–0.2)

## 2020-12-03 LAB — GLUCOSE, CAPILLARY
Glucose-Capillary: 142 mg/dL — ABNORMAL HIGH (ref 70–99)
Glucose-Capillary: 158 mg/dL — ABNORMAL HIGH (ref 70–99)
Glucose-Capillary: 164 mg/dL — ABNORMAL HIGH (ref 70–99)
Glucose-Capillary: 200 mg/dL — ABNORMAL HIGH (ref 70–99)

## 2020-12-03 MED ORDER — SODIUM ZIRCONIUM CYCLOSILICATE 5 G PO PACK
5.0000 g | PACK | Freq: Once | ORAL | Status: AC
  Administered 2020-12-03: 5 g via ORAL
  Filled 2020-12-03: qty 1

## 2020-12-03 NOTE — Progress Notes (Signed)
PROGRESS NOTE    SHELVEY STAEBELL   [id number]  DOB: 07/18/1955  DOA: 12/10/2020 PCP: Claretta Fraise, MD   Brief Narrative:  Christy Zhang is a 65 old female with history of bilateral BKA, ESRD on dialysis on Monday/Wednesday/Friday, type 2 diabetes, hypertension, peripheral vascular disease, hyperlipidemia, recently discharged from here on 11/14/2020 after prolonged hospitalization when she was admitted with altered mental status and had to be intubated for airway protection and also underwent CRRT in the setting of septic shock from pneumonia causing acute respiratory failure with hypoxia, AKI.  She presented at [facility] hospital ED because it was too painful to sit down for dialysis due to her decubitus ulcer and she also missed 2 session of dialysis.  Patient was sent to [facility] from [facility] for further management.  Nephrology,orthopedics,palliative care vascular surgery consulted.   Subjective: She states she has been sleeping a great deal since yesterday. We discussed the gravity of her situation again and she mostly remained silent and tearful. She does not want me to contact family.     Assessment & Plan:   Principal Problem:   Gangrene of left hand - s/p thrombectomy of left radial artery thrombotic occlusion - vascular surgery has been following- I have touched base with Dr Roselie Awkward today S/P BKA (below knee amputation) bilateral ([place]) Wound on b/l stumps- Leukocytosis  - vascular surgery recommending b/l AKA and patient in agreement of this- to be performed next week - WBC rising from 25.8> 45.8 > 54- suspect wounds are the cause- possibly is bacteremic - temps 100.3 on 8/6- started Vanc, Cefepime, Flagyl on 8/6- ordered blood cultures, UA and Urine culture prior to this - discussed the poor prognosis with the patient again    Active Problems:   Sacral and right hip decubitus ulcers - general surgery does not recommend debridement but has recommended hydrotherapy and  santyl   ESRD- AOCD - cont dialysis per nephrology on M/W/F via LUE fistula - she is unable to sit for dialysis and at this point we are not sure how she will receive outpt dialysis  Hyperkalemia - is not going to be receiving dialysis today- K 5.4- start Lokelma so it does not rise further by tomorrow    Obesity, Class III, BMI 40-49.9 (morbid obesity) ([place])  Body mass index is 33.98 kg/m.    Paroxysmal atrial fibrillation (HCC) - cont amiodarone- eliquis on hold   Left renal mass - MRI abdomen attempted  x 4 - I will cancel it now.  DM2 - cont Levemir and SSI Hemoglobin A1C    Component Value Date/Time   HGBA1C 6.4 (H) 10/27/2020 1848   HGBA1C 5.4 04/06/2020 1105     Time spent in minutes: 35 DVT prophylaxis: holding Code Status: partial code Family Communication:  Level of Care: Level of care: Telemetry Medical Disposition Plan:  Status is: Inpatient  Remains inpatient appropriate because:IV treatments appropriate due to intensity of illness or inability to take PO  Dispo: The patient is from: Home              Anticipated d/c is to:  TBD              Patient currently is not medically stable to d/c.   Difficult to place patient No   Consultants:  Nephrology Gen surgery Vascular surgery Procedures:   Antimicrobials:  Anti-infectives (From admission, onward)    Start     Dose/Rate Route Frequency Ordered Stop   12/04/20 1200  vancomycin (VANCOCIN) IVPB 1000 mg/200 mL premix        1,000 mg 200 mL/hr over 60 Minutes Intravenous Every M-W-F (Hemodialysis) 12/02/20 1734     12/03/20 1800  ceFEPIme (MAXIPIME) 1 g in sodium chloride 0.9 % 100 mL IVPB        1 g 200 mL/hr over 30 Minutes Intravenous Every 24 hours 12/02/20 1734     12/02/20 1830  vancomycin (VANCOREADY) IVPB 1750 mg/350 mL        1,750 mg 175 mL/hr over 120 Minutes Intravenous  Once 12/02/20 1734 12/02/20 2156   12/02/20 1800  ceFEPIme (MAXIPIME) 2 g in sodium chloride 0.9 % 100 mL IVPB         2 g 200 mL/hr over 30 Minutes Intravenous  Once 12/02/20 1707 12/02/20 2003   12/02/20 1800  metroNIDAZOLE (FLAGYL) IVPB 500 mg        500 mg 100 mL/hr over 60 Minutes Intravenous Every 8 hours 12/02/20 1707     12/02/20 1800  vancomycin (VANCOCIN) IVPB 1000 mg/200 mL premix  Status:  Discontinued        1,000 mg 200 mL/hr over 60 Minutes Intravenous  Once 12/02/20 1707 12/02/20 1734        Objective: Vitals:   12/02/20 2119 12/03/20 0000 12/03/20 0405 12/03/20 0952  BP: (!) 124/55 121/63 130/71 (!) 131/56  Pulse: 82 85 92 82  Resp: '19 18 19 16  '$ Temp: 99 F (37.2 C) 99.6 F (37.6 C) 98.8 F (37.1 C) 99.5 F (37.5 C)  TempSrc: Axillary Axillary Axillary Oral  SpO2: 94% 98% 100% 97%  Weight:      Height:        Intake/Output Summary (Last 24 hours) at 12/03/2020 1359 Last data filed at 12/03/2020 1300 Gross per 24 hour  Intake 847.02 ml  Output --  Net 847.02 ml    Filed Weights   12/04/2020 1001 12/04/2020 1720 11/28/20 0517  Weight: 86.2 kg 86.2 kg 89.8 kg    Examination: General exam: Appears comfortable  HEENT: PERRLA, oral mucosa moist, no sclera icterus or thrush Respiratory system: Clear to auscultation. Respiratory effort normal. Cardiovascular system: S1 & S2 heard, regular rate and rhythm Gastrointestinal system: Abdomen soft, non-tender, nondistended. Normal bowel sounds   Central nervous system: Alert and oriented. No focal neurological deficits. Extremities: No cyanosis, clubbing or edema- b/l BKA wounds noted- gangrene of left hand noted  Skin: No rashes or ulcers Psychiatry:  Mood & affect appropriate.      Data Reviewed: I have personally reviewed following labs and imaging studies  CBC: Recent Labs  Lab 12/01/2020 1021 12/14/2020 1513 11/28/20 0532 11/29/20 0927 12/02/20 0437 12/02/20 1529 12/02/20 1834 12/02/20 2304  WBC 23.6*   < > 22.4* 25.8* 45.8* 54.5* 44.2*  --   NEUTROABS 20.5*  --   --  22.3*  --   --  39.5*  --   HGB 8.3*   < >  7.5* 7.0* 7.4* 7.5* 6.4* 7.6*  HCT 26.9*   < > 24.7* 22.2* 24.8* 23.4* 21.5* 24.1*  MCV 76.4*   < > 75.5* 75.5* 77.0* 75.7* 78.8*  --   PLT 737*   < > 703* 682* 720* 737* 623*  --    < > = values in this interval not displayed.    Basic Metabolic Panel: Recent Labs  Lab 11/28/20 0532 11/29/20 0926 11/30/20 0316 12/01/20 0309 12/02/20 0437 12/02/20 1834 12/03/20 0254  NA 133* 131* 131* 133* 134* 134*  134*  K 4.5 4.3 5.5* 5.1 4.6 4.0 5.4*  CL 90* 92* 93* 95* 95* 102 95*  CO2 27 24 19* 22 26 19* 23  GLUCOSE 70 120* 120* 99 145* 138* 154*  BUN 40* 54* 36* 42* 28* 32* 44*  CREATININE 8.16* 9.71* 6.62* 7.97* 5.87* 5.60* 7.31*  CALCIUM 8.8* 8.0* 8.9 8.7* 8.4* 6.9* 8.5*  MG 2.0  --   --   --   --   --   --   PHOS  --  6.4* 4.6 6.0* 4.3  --  5.7*    GFR: Estimated Creatinine Clearance: 8.5 mL/min (A) (by C-G formula based on SCr of 7.31 mg/dL (H)). Liver Function Tests: Recent Labs  Lab 12/06/2020 1021 12/13/2020 1513 11/30/20 0316 12/01/20 0309 12/02/20 0437 12/02/20 1834 12/03/20 0254  AST 12*  --   --   --   --  24  --   ALT 10  --   --   --   --  10  --   ALKPHOS 79  --   --   --   --  93  --   BILITOT 1.0  --   --   --   --  0.6  --   PROT 7.2  --   --   --   --  5.5*  --   ALBUMIN 2.1*   < > 2.0* 1.9* 1.8* 1.4* 1.7*   < > = values in this interval not displayed.    No results for input(s): LIPASE, AMYLASE in the last 168 hours.  No results for input(s): AMMONIA in the last 168 hours. Coagulation Profile: Recent Labs  Lab 12/02/20 1834  INR 1.5*    Cardiac Enzymes: No results for input(s): CKTOTAL, CKMB, CKMBINDEX, TROPONINI in the last 168 hours. BNP (last 3 results) No results for input(s): PROBNP in the last 8760 hours. HbA1C: No results for input(s): HGBA1C in the last 72 hours. CBG: Recent Labs  Lab 12/02/20 1137 12/02/20 1641 12/02/20 2104 12/03/20 0636 12/03/20 1208  GLUCAP 161* 170* 158* 142* 158*    Lipid Profile: No results for input(s):  CHOL, HDL, LDLCALC, TRIG, CHOLHDL, LDLDIRECT in the last 72 hours. Thyroid Function Tests: No results for input(s): TSH, T4TOTAL, FREET4, T3FREE, THYROIDAB in the last 72 hours. Anemia Panel: No results for input(s): VITAMINB12, FOLATE, FERRITIN, TIBC, IRON, RETICCTPCT in the last 72 hours. Urine analysis:    Component Value Date/Time   COLORURINE AMBER (A) 10/28/2020 1322   APPEARANCEUR TURBID (A) 10/28/2020 1322   LABSPEC 1.014 10/28/2020 1322   PHURINE 5.0 10/28/2020 1322   GLUCOSEU NEGATIVE 10/28/2020 1322   HGBUR MODERATE (A) 10/28/2020 1322   BILIRUBINUR NEGATIVE 10/28/2020 1322   KETONESUR NEGATIVE 10/28/2020 1322   PROTEINUR 100 (A) 10/28/2020 1322   NITRITE NEGATIVE 10/28/2020 1322   LEUKOCYTESUR LARGE (A) 10/28/2020 1322   Sepsis Labs: '@LABRCNTIP'$ (procalcitonin:4,lacticidven:4) ) Recent Results (from the past 240 hour(s))  Resp Panel by RT-PCR (Flu A&B, Covid) Nasopharyngeal Swab     Status: None   Collection Time: 11/23/20 10:20 PM   Specimen: Nasopharyngeal Swab; Nasopharyngeal(NP) swabs in vial transport medium  Result Value Ref Range Status   SARS Coronavirus 2 by RT PCR NEGATIVE NEGATIVE Final    Comment: (NOTE) SARS-CoV-2 target nucleic acids are NOT DETECTED.  The SARS-CoV-2 RNA is generally detectable in upper respiratory specimens during the acute phase of infection. The lowest concentration of SARS-CoV-2 viral copies this assay can detect is 138 copies/mL. A  negative result does not preclude SARS-Cov-2 infection and should not be used as the sole basis for treatment or other patient management decisions. A negative result may occur with  improper specimen collection/handling, submission of specimen other than nasopharyngeal swab, presence of viral mutation(s) within the areas targeted by this assay, and inadequate number of viral copies(<138 copies/mL). A negative result must be combined with clinical observations, patient history, and  epidemiological information. The expected result is Negative.  Fact Sheet for Patients:  EntrepreneurPulse.com.au  Fact Sheet for Healthcare Providers:  IncredibleEmployment.be  This test is no t yet approved or cleared by the [place] FDA and  has been authorized for detection and/or diagnosis of SARS-CoV-2 by FDA under an Emergency Use Authorization (EUA). This EUA will remain  in effect (meaning this test can be used) for the duration of the COVID-19 declaration under Section 564(b)(1) of the Act, 21 U.S.C.section 360bbb-3(b)(1), unless the authorization is terminated  or revoked sooner.       Influenza A by PCR NEGATIVE NEGATIVE Final   Influenza B by PCR NEGATIVE NEGATIVE Final    Comment: (NOTE) The Xpert Xpress SARS-CoV-2/FLU/RSV plus assay is intended as an aid in the diagnosis of influenza from Nasopharyngeal swab specimens and should not be used as a sole basis for treatment. Nasal washings and aspirates are unacceptable for Xpert Xpress SARS-CoV-2/FLU/RSV testing.  Fact Sheet for Patients: EntrepreneurPulse.com.au  Fact Sheet for Healthcare Providers: IncredibleEmployment.be  This test is not yet approved or cleared by the [place] FDA and has been authorized for detection and/or diagnosis of SARS-CoV-2 by FDA under an Emergency Use Authorization (EUA). This EUA will remain in effect (meaning this test can be used) for the duration of the COVID-19 declaration under Section 564(b)(1) of the Act, 21 U.S.C. section 360bbb-3(b)(1), unless the authorization is terminated or revoked.  Performed at [facility], [address], [place], [place] [address]   Culture, blood (routine x 2)     Status: None   Collection Time: 11/23/20 10:22 PM   Specimen: BLOOD LEFT FOREARM  Result Value Ref Range Status   Specimen Description BLOOD LEFT FOREARM  Final   Special Requests   Final    Blood  Culture adequate volume BOTTLES DRAWN AEROBIC AND ANAEROBIC   Culture   Final    NO GROWTH 6 DAYS Performed at [facility], [address], [place], [place] [address]    Report Status 11/29/2020 FINAL  Final  Resp Panel by RT-PCR (Flu A&B, Covid) Nasopharyngeal Swab     Status: None   Collection Time: 12/21/2020 10:26 AM   Specimen: Nasopharyngeal Swab; Nasopharyngeal(NP) swabs in vial transport medium  Result Value Ref Range Status   SARS Coronavirus 2 by RT PCR NEGATIVE NEGATIVE Final    Comment: (NOTE) SARS-CoV-2 target nucleic acids are NOT DETECTED.  The SARS-CoV-2 RNA is generally detectable in upper respiratory specimens during the acute phase of infection. The lowest concentration of SARS-CoV-2 viral copies this assay can detect is 138 copies/mL. A negative result does not preclude SARS-Cov-2 infection and should not be used as the sole basis for treatment or other patient management decisions. A negative result may occur with  improper specimen collection/handling, submission of specimen other than nasopharyngeal swab, presence of viral mutation(s) within the areas targeted by this assay, and inadequate number of viral copies(<138 copies/mL). A negative result must be combined with clinical observations, patient history, and epidemiological information. The expected result is Negative.  Fact Sheet for Patients:  EntrepreneurPulse.com.au  Fact Sheet for Healthcare Providers:  IncredibleEmployment.be  This test is no t yet approved or cleared by the [place] FDA and  has been authorized for detection and/or diagnosis of SARS-CoV-2 by FDA under an Emergency Use Authorization (EUA). This EUA will remain  in effect (meaning this test can be used) for the duration of the COVID-19 declaration under Section 564(b)(1) of the Act, 21 U.S.C.section 360bbb-3(b)(1), unless the authorization is terminated  or revoked sooner.       Influenza  A by PCR NEGATIVE NEGATIVE Final   Influenza B by PCR NEGATIVE NEGATIVE Final    Comment: (NOTE) The Xpert Xpress SARS-CoV-2/FLU/RSV plus assay is intended as an aid in the diagnosis of influenza from Nasopharyngeal swab specimens and should not be used as a sole basis for treatment. Nasal washings and aspirates are unacceptable for Xpert Xpress SARS-CoV-2/FLU/RSV testing.  Fact Sheet for Patients: EntrepreneurPulse.com.au  Fact Sheet for Healthcare Providers: IncredibleEmployment.be  This test is not yet approved or cleared by the [place] FDA and has been authorized for detection and/or diagnosis of SARS-CoV-2 by FDA under an Emergency Use Authorization (EUA). This EUA will remain in effect (meaning this test can be used) for the duration of the COVID-19 declaration under Section 564(b)(1) of the Act, 21 U.S.C. section 360bbb-3(b)(1), unless the authorization is terminated or revoked.  Performed at [facility], [address] [place], [place] [address]   Blood culture (routine x 2)     Status: None   Collection Time: 12/16/2020 10:45 AM   Specimen: BLOOD  Result Value Ref Range Status   Specimen Description BLOOD LEFT ARM  Final   Special Requests   Final    BOTTLES DRAWN AEROBIC AND ANAEROBIC Blood Culture adequate volume   Culture   Final    NO GROWTH 5 DAYS Performed at [facility], [address] [place], [place] [address]    Report Status 12/02/2020 FINAL  Final  Blood culture (routine x 2)     Status: None   Collection Time: 12/24/2020 12:42 PM   Specimen: Left Antecubital; Blood  Result Value Ref Range Status   Specimen Description LEFT ANTECUBITAL  Final   Special Requests   Final    Blood Culture adequate volume BOTTLES DRAWN AEROBIC AND ANAEROBIC   Culture   Final    NO GROWTH 5 DAYS Performed at [facility], [address] [place], [place] [address]    Report Status 12/02/2020 FINAL  Final  MRSA Next Gen by PCR,  Nasal     Status: Abnormal   Collection Time: 11/28/20  1:41 AM   Specimen: Nasal Mucosa; Nasal Swab  Result Value Ref Range Status   MRSA by PCR Next Gen DETECTED (A) NOT DETECTED Final    Comment: RESULT CALLED TO, READ BACK BY AND VERIFIED WITH: Starr Sinclair RN, AT [phone] 11/28/20 D. VANHOOK (NOTE) The GeneXpert MRSA Assay (FDA approved for NASAL specimens only), is one component of a comprehensive MRSA colonization surveillance program. It is not intended to diagnose MRSA infection nor to guide or monitor treatment for MRSA infections. Test performance is not FDA approved in patients less than 50 years old. Performed at [facility], [place] [address] [place],  [address]   Culture, blood (x 2)     Status: None (Preliminary result)   Collection Time: 12/02/20  6:34 PM   Specimen: BLOOD  Result Value Ref Range Status   Specimen Description BLOOD LEFT ANTECUBITAL  Final   Special  Requests AEROBIC BOTTLE ONLY Blood Culture adequate volume  Final   Culture   Final    NO GROWTH < 12 HOURS Performed at [facility], [place] [address], [place], [place] [address]    Report Status PENDING  Incomplete  Culture, blood (x 2)     Status: None (Preliminary result)   Collection Time: 12/02/20  6:34 PM   Specimen: BLOOD  Result Value Ref Range Status   Specimen Description BLOOD LEFT ANTECUBITAL  Final   Special Requests IN PEDIATRIC BOTTLE Blood Culture adequate volume  Final   Culture   Final    NO GROWTH < 12 HOURS Performed at [facility], [place] [address] [place],  [address]    Report Status PENDING  Incomplete         Radiology Studies: No results found.    Scheduled Meds:  (feeding supplement) PROSource Plus  30 mL Oral BID BM   sodium chloride   Intravenous Once   amiodarone  200 mg Oral Daily   aspirin EC  81 mg Oral QHS   atorvastatin  40 mg Oral QPM   Chlorhexidine Gluconate Cloth  6 each Topical Q0600   collagenase   Topical Daily   [START  ON 12/04/2020] darbepoetin (ARANESP) injection - DIALYSIS  150 mcg Intravenous Q Mon-HD   doxercalciferol  1.5 mcg Intravenous Q M,W,F-HD   enoxaparin (LOVENOX) injection  90 mg Subcutaneous Q24H   feeding supplement (NEPRO CARB STEADY)  237 mL Oral BID BM   gabapentin  300 mg Oral QHS   hydrocortisone  25 mg Rectal BID   insulin aspart  0-15 Units Subcutaneous TID WC   insulin detemir  16 Units Subcutaneous QHS   midodrine  5 mg Oral TID WC   multivitamin  1 tablet Oral QHS   ondansetron (ZOFRAN) IV  4 mg Intravenous Once   polyethylene glycol  17 g Oral Daily   senna-docusate  1 tablet Oral BID   sevelamer carbonate  2,400 mg Oral TID with meals   sodium chloride flush  3 mL Intravenous Q12H   sodium hypochlorite   Irrigation Daily   Continuous Infusions:  sodium chloride     sodium chloride     sodium chloride Stopped (12/03/20 0256)   ceFEPime (MAXIPIME) IV     metronidazole 500 mg (12/03/20 1014)   [START ON 12/04/2020] vancomycin       LOS: 6 days      Debbe Odea, MD [facility] Pager: [email or web address] 12/03/2020, 1:59 PM

## 2020-12-03 NOTE — Progress Notes (Addendum)
ANTICOAGULATION CONSULT NOTE - Follow Up Consult  Pharmacy Consult for Lovenox Indication: atrial fibrillation  Allergies  Allergen Reactions   Sulfa Antibiotics Swelling    Patient Measurements: Height: '5\' 4"'$  (162.6 cm) Weight:  (unable to obtain) IBW/kg (Calculated) : 54.7  Vital Signs: Temp: 98.8 F (37.1 C) (08/07 0405) Temp Source: Axillary (08/07 0405) BP: 130/71 (08/07 0405) Pulse Rate: 92 (08/07 0405)  Labs: Recent Labs    12/02/20 0437 12/02/20 1529 12/02/20 1834 12/02/20 2304 12/03/20 0254  HGB 7.4* 7.5* 6.4* 7.6*  --   HCT 24.8* 23.4* 21.5* 24.1*  --   PLT 720* 737* 623*  --   --   APTT  --   --  39*  --   --   LABPROT  --   --  18.1*  --   --   INR  --   --  1.5*  --   --   CREATININE 5.87*  --  5.60*  --  7.31*     Estimated Creatinine Clearance: 8.5 mL/min (A) (by C-G formula based on SCr of 7.31 mg/dL (H)).   Assessment: Lovenox for afib. PTA apixaban '5mg'$  BID on hold for BL AKA planned week of 8/8.  Last dose 7/31 PM. Hgb 7.6. Plts elevated 623. Dose for ESRD  Goal of Therapy:  Monitor platelets by anticoagulation protocol: Yes   Plan:  Continue Lovenox 90 mg subq every 24 hours  - Monitor CBC q72h on LMWH and s/s of bleeding - Aranesp 150 mcg IV QWed to begin 8/3- not given, reschedule for 8/5  Dimple Nanas, PharmD 12/03/2020 7:47 AM

## 2020-12-03 NOTE — Plan of Care (Signed)
  Problem: Clinical Measurements: Goal: Will remain free from infection Outcome: Progressing   Problem: Activity: Goal: Risk for activity intolerance will decrease Outcome: Progressing   Problem: Nutrition: Goal: Adequate nutrition will be maintained Outcome: Progressing   Problem: Skin Integrity: Goal: Risk for impaired skin integrity will decrease Outcome: Progressing   

## 2020-12-03 NOTE — Progress Notes (Signed)
Patient ID: RAYNE NOGALES, female   DOB: 1955/12/05, 65 y.o.   MRN: [id number]  [facility] Progress Note   Assessment/ Plan:   1.  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  She has agreed to bilateral above-knee amputations to be done at some point next week by vascular surgery. 2. ESRD: Continue hemodialysis on a Monday/Wednesday/Friday schedule via left upper arm AV fistula with next hemodialysis ordered for tomorrow.  I will give her Lokelma today for mild hyperkalemia.  With her current wounds on the back/inability to sit in recliner, unable to undertake outpatient dialysis and will need LTAC. 3. Anemia: Secondary to end-stage renal disease as well as malnutrition-inflammation complex from multiple wounds on her back/limb gangrene. 4. CKD-MBD: Phosphorus level elevated with ongoing sevelamer, continue Hectorol for PTH control. 5. Nutrition: Albumin level significantly depressed secondary to multifocal sites of inflammation.  Continue ONS. 6. Hypertension: Blood pressure currently at acceptable range, monitor with HD/UF.  Subjective:   Changes in mental status noted overnight prompting sepsis work-up (lactic acid level/vital signs normal).  Had CBC done yesterday evening with acute drop of hemoglobin/hematocrit which on repeat appears to have improved negating need for PRBC transfusion.   Objective:   BP (!) 131/56 (BP Location: Left Arm)   Pulse 82   Temp 99.5 F (37.5 C) (Oral)   Resp 16   Ht '5\' 4"'$  (1.626 m)   Wt 89.8 kg   LMP  (LMP Unknown)   SpO2 97%   BMI 33.98 kg/m   Physical Exam: Gen: Appears comfortable resting in bed, denies any chest pain or shortness of breath and would like ice. CVS: Pulse regular rhythm, normal rate, S1 and S2  with ejection systolic murmur Resp: Diminished breath sounds over bases, no rales/rhonchi Abd: Soft, obese, nontender Ext: Status post bilateral BKA, RUA AVF with intact dressings  Labs: BMET Recent Labs  Lab 12/15/2020 1513 11/28/20 0532 11/29/20 0926 11/30/20 0316 12/01/20 0309 12/02/20 0437 12/02/20 1834 12/03/20 0254  NA 130* 133* 131* 131* 133* 134* 134* 134*  K 5.9* 4.5 4.3 5.5* 5.1 4.6 4.0 5.4*  CL 92* 90* 92* 93* 95* 95* 102 95*  CO2 '22 27 24 '$ 19* 22 26 19* 23  GLUCOSE 124* 70 120* 120* 99 145* 138* 154*  BUN 83* 40* 54* 36* 42* 28* 32* 44*  CREATININE 13.93* 8.16* 9.71* 6.62* 7.97* 5.87* 5.60* 7.31*  CALCIUM 7.9* 8.8* 8.0* 8.9 8.7* 8.4* 6.9* 8.5*  PHOS 9.7*  --  6.4* 4.6 6.0* 4.3  --  5.7*   CBC Recent Labs  Lab 11/29/2020 1021 12/22/2020 1513 11/29/20 0927 12/02/20 0437 12/02/20 1529 12/02/20 1834 12/02/20 2304  WBC 23.6*   < > 25.8* 45.8* 54.5* 44.2*  --   NEUTROABS 20.5*  --  22.3*  --   --  39.5*  --   HGB 8.3*   < > 7.0* 7.4* 7.5* 6.4* 7.6*  HCT 26.9*   < > 22.2* 24.8* 23.4* 21.5* 24.1*  MCV 76.4*   < > 75.5* 77.0* 75.7* 78.8*  --   PLT 737*   < > 682* 720* 737* 623*  --    < > = values in this interval not displayed.      Medications:     (feeding  supplement) PROSource Plus  30 mL Oral BID BM   sodium chloride   Intravenous Once   amiodarone  200 mg Oral Daily   aspirin EC  81 mg Oral QHS   atorvastatin  40 mg Oral QPM   Chlorhexidine Gluconate Cloth  6 each Topical Q0600   collagenase   Topical Daily   [START ON 12/04/2020] darbepoetin (ARANESP) injection - DIALYSIS  150 mcg Intravenous Q Mon-HD   doxercalciferol  1.5 mcg Intravenous Q M,W,F-HD   enoxaparin (LOVENOX) injection  90 mg Subcutaneous Q24H   feeding supplement (NEPRO CARB STEADY)  237 mL Oral BID BM   gabapentin  300 mg Oral QHS   hydrocortisone  25 mg Rectal BID   insulin aspart  0-15 Units Subcutaneous TID WC   insulin detemir  16 Units Subcutaneous QHS   midodrine  5 mg Oral TID WC    multivitamin  1 tablet Oral QHS   ondansetron (ZOFRAN) IV  4 mg Intravenous Once   polyethylene glycol  17 g Oral Daily   senna-docusate  1 tablet Oral BID   sevelamer carbonate  2,400 mg Oral TID with meals   sodium chloride flush  3 mL Intravenous Q12H   sodium hypochlorite   Irrigation Daily   sodium zirconium cyclosilicate  5 g Oral Once   Elmarie Shiley, MD 12/03/2020, 11:20 AM

## 2020-12-04 DIAGNOSIS — I96 Gangrene, not elsewhere classified: Secondary | ICD-10-CM | POA: Diagnosis not present

## 2020-12-04 DIAGNOSIS — I480 Paroxysmal atrial fibrillation: Secondary | ICD-10-CM | POA: Diagnosis not present

## 2020-12-04 DIAGNOSIS — N186 End stage renal disease: Secondary | ICD-10-CM | POA: Diagnosis not present

## 2020-12-04 LAB — RENAL FUNCTION PANEL
Albumin: 1.6 g/dL — ABNORMAL LOW (ref 3.5–5.0)
Anion gap: 16 — ABNORMAL HIGH (ref 5–15)
BUN: 63 mg/dL — ABNORMAL HIGH (ref 8–23)
CO2: 25 mmol/L (ref 22–32)
Calcium: 8.6 mg/dL — ABNORMAL LOW (ref 8.9–10.3)
Chloride: 92 mmol/L — ABNORMAL LOW (ref 98–111)
Creatinine, Ser: 8.83 mg/dL — ABNORMAL HIGH (ref 0.44–1.00)
GFR, Estimated: 5 mL/min — ABNORMAL LOW (ref >60)
Glucose, Bld: 168 mg/dL — ABNORMAL HIGH (ref 70–99)
Phosphorus: 5.7 mg/dL — ABNORMAL HIGH (ref 2.5–4.6)
Potassium: 5.7 mmol/L — ABNORMAL HIGH (ref 3.5–5.1)
Sodium: 133 mmol/L — ABNORMAL LOW (ref 135–145)

## 2020-12-04 LAB — GLUCOSE, CAPILLARY
Glucose-Capillary: 174 mg/dL — ABNORMAL HIGH (ref 70–99)
Glucose-Capillary: 135 mg/dL — ABNORMAL HIGH (ref 70–99)
Glucose-Capillary: 161 mg/dL — ABNORMAL HIGH (ref 70–99)
Glucose-Capillary: 161 mg/dL — ABNORMAL HIGH (ref 70–99)

## 2020-12-04 LAB — CBC
HCT: 25.2 % — ABNORMAL LOW (ref 36.0–46.0)
Hemoglobin: 7.8 g/dL — ABNORMAL LOW (ref 12.0–15.0)
MCH: 23.4 pg — ABNORMAL LOW (ref 26.0–34.0)
MCHC: 31 g/dL (ref 30.0–36.0)
MCV: 75.7 fL — ABNORMAL LOW (ref 80.0–100.0)
Platelets: 756 10*3/uL — ABNORMAL HIGH (ref 150–400)
RBC: 3.33 MIL/uL — ABNORMAL LOW (ref 3.87–5.11)
RDW: 20 % — ABNORMAL HIGH (ref 11.5–15.5)
WBC: 53.8 10*3/uL (ref 4.0–10.5)
nRBC: 0.1 % (ref 0.0–0.2)

## 2020-12-04 MED ORDER — DOXERCALCIFEROL 4 MCG/2ML IV SOLN
INTRAVENOUS | Status: AC
  Administered 2020-12-04: 1.5 ug via INTRAVENOUS
  Filled 2020-12-04: qty 2

## 2020-12-04 MED ORDER — DARBEPOETIN ALFA 150 MCG/0.3ML IJ SOSY
PREFILLED_SYRINGE | INTRAMUSCULAR | Status: AC
  Administered 2020-12-04: 150 ug via INTRAVENOUS
  Filled 2020-12-04: qty 0.3

## 2020-12-04 MED ORDER — SODIUM ZIRCONIUM CYCLOSILICATE 5 G PO PACK
5.0000 g | PACK | Freq: Once | ORAL | Status: DC
  Filled 2020-12-04: qty 1

## 2020-12-04 MED ORDER — DIAZEPAM 5 MG/ML IJ SOLN
2.5000 mg | Freq: Once | INTRAMUSCULAR | Status: AC
  Administered 2020-12-04: 2.5 mg via INTRAVENOUS
  Filled 2020-12-04: qty 2

## 2020-12-04 MED ORDER — HEPARIN SODIUM (PORCINE) 1000 UNIT/ML IJ SOLN
INTRAMUSCULAR | Status: AC
  Administered 2020-12-04: 1700 [IU] via INTRAVENOUS_CENTRAL
  Filled 2020-12-04: qty 2

## 2020-12-04 MED ORDER — DAKINS (1/4 STRENGTH) 0.125 % EX SOLN: Freq: Every day | CUTANEOUS | Status: DC

## 2020-12-04 MED ORDER — MIDODRINE HCL 5 MG PO TABS
ORAL_TABLET | ORAL | Status: AC
  Administered 2020-12-04: 5 mg via ORAL
  Filled 2020-12-04: qty 1

## 2020-12-04 MED ORDER — VANCOMYCIN HCL IN DEXTROSE 1-5 GM/200ML-% IV SOLN
1000.0000 mg | INTRAVENOUS | Status: AC
  Administered 2020-12-05: 1000 mg via INTRAVENOUS
  Filled 2020-12-04: qty 200

## 2020-12-04 MED ORDER — ALBUMIN HUMAN 25 % IV SOLN
INTRAVENOUS | Status: AC
  Administered 2020-12-04: 25 g
  Filled 2020-12-04: qty 100

## 2020-12-04 MED ORDER — SODIUM ZIRCONIUM CYCLOSILICATE 10 G PO PACK: 10.0000 g | PACK | Freq: Once | ORAL | Status: DC

## 2020-12-04 MED ORDER — DAKINS (1/4 STRENGTH) 0.125 % EX SOLN
Freq: Every day | CUTANEOUS | Status: DC
  Filled 2020-12-04: qty 473

## 2020-12-04 NOTE — Consult Note (Addendum)
[place] Nurse wound follow up Patient receiving care in [facility] 66M This patient was seen by my colleague D. Engels on 11/28/20 to evaluate her multiple wounds. CCS was since consulted and ordered Hydrotherapy to her sacral and hip wounds. CCS is now signing off and Dr. Wynelle Cleveland has re-consulted [facility] team to follow this patient. Spoke with Rolinda Roan PT about her hydrotherapy and has discovered a wound on the left side this is requiring hydrotherapy and feels that Dakin's will need to be used x 3 days. This has been ordered and wound care orders have been placed for Bedside RN to change dressings on Sunday since Hydrotherapy is M-Sa. WOC will follow this patient and see weekly.  Cathlean Marseilles Tamala Julian, MSN, RN, [place], Lysle Pearl, [facility] Wound Treatment Associate Pager [phone]

## 2020-12-04 NOTE — Plan of Care (Addendum)
Patient presenting with new myoclonic jerking this AM. Dr. Clearence Ped notified; valium ordered by MD and given by RN. RRT notified to [facility] regarding this patient as well. VS remain WNL for now.

## 2020-12-04 NOTE — Chronic Care Management (AMB) (Signed)
Care Management    RN Visit Note  11/24/2020 Name: Christy Zhang MRN: [id number] DOB: 12-13-1955  Subjective: Christy Zhang is a 65 y.o. year old female who is a primary care patient of [name], Christy Gash, MD. The care management team was consulted for assistance with disease management and care coordination needs.    Engaged with patient's daughter, Christy Zhang, by telephone  for follow up visit in response to provider referral for case management and/or care coordination services.   Consent to Services:   Ms. Warning was given information about Care Management services today including:  Care Management services includes personalized support from designated clinical staff supervised by her physician, including individualized plan of care and coordination with other care providers 24/7 contact phone numbers for assistance for urgent and routine care needs. The patient may stop case management services at any time by phone call to the office staff.  Patient agreed to services and consent obtained.   Assessment: Review of patient past medical history, allergies, medications, health status, including review of consultants reports, laboratory and other test data, was performed as part of comprehensive evaluation and provision of chronic care management services.   SDOH (Social Determinants of Health) assessments and interventions performed:    Care Plan  Allergies  Allergen Reactions   Sulfa Antibiotics Swelling    No facility-administered encounter medications on file as of 11/24/2020.   Outpatient Encounter Medications as of 11/24/2020  Medication Sig Note   acetaminophen (TYLENOL) 325 MG tablet Take 1-2 tablets (325-650 mg total) by mouth every 4 (four) hours as needed for mild pain.    amiodarone (PACERONE) 200 MG tablet Take 1 tablet (200 mg total) by mouth daily for 19 days.    apixaban (ELIQUIS) 5 MG TABS tablet Take 1 tablet (5 mg total) by mouth 2 (two) times daily.    aspirin EC  81 MG tablet Take 81 mg by mouth at bedtime. Swallow whole.    atorvastatin (LIPITOR) 40 MG tablet TAKE ONE TABLET BY MOUTH DAILY    diphenhydrAMINE (BENADRYL) 25 MG tablet Take 25 mg by mouth every 6 (six) hours as needed for itching.    gabapentin (NEURONTIN) 300 MG capsule Take 1 capsule (300 mg total) by mouth at bedtime.    insulin detemir (LEVEMIR) 100 UNIT/ML injection Inject 0.16 mLs (16 Units total) into the skin daily.    Insulin Syringe-Needle U-100 28G X 1/2" 1 ML MISC Use with insulin daily Dx E11.22    midodrine (PROAMATINE) 5 MG tablet Take 1 tablet (5 mg total) by mouth 3 (three) times daily with meals.    Misc. Devices (EGG CRATE BED PAD) MISC Use to treat and prevent bed sores    oxyCODONE (OXY IR/ROXICODONE) 5 MG immediate release tablet Take 1 tablet (5 mg total) by mouth every 6 (six) hours as needed for severe pain.    polyethylene glycol (MIRALAX / GLYCOLAX) 17 g packet Take 17 g by mouth daily. (Patient not taking: Reported on 12/24/2020)    senna-docusate (SENOKOT-S) 8.6-50 MG tablet Take 1 tablet by mouth 2 (two) times daily.    sevelamer carbonate (RENVELA) 800 MG tablet Take 1,600-2,400 mg by mouth See admin instructions. Take 2400 mg with each meal and 1600 mg with each snack    TRULICITY 1.5 WC/5.8NI SOPN INJECT CONTENTs OF ONE PEN UNDER THE SKIN WEEKLY ON SATURDAY (Patient taking differently: Inject 1.5 mg into the skin once a week. On Saturdays) 10/27/2020: Missed it sat so she took it  on Wed    Patient Active Problem List   Diagnosis Date Noted   Gangrene of hand ([place]) 11/29/2020   ESRD (end stage renal disease) on dialysis ([place]) 12/17/2020   Paroxysmal atrial fibrillation ([place])    Cardiac arrest ([place])    CAP (community acquired pneumonia) 10/27/2020   Altered mental status 10/27/2020   Acute respiratory failure with hypoxemia ([place]) 10/27/2020   Lactic acidosis 10/27/2020   Hypoalbuminemia 10/27/2020   Hyponatremia 10/27/2020   Hyperlipidemia 10/27/2020   S/P  BKA (below knee amputation) unilateral, right ([place]) 09/14/2020   Diabetic foot infection ([place])    Medication monitoring encounter    Pressure injury of skin 08/11/2020   Thrombocytosis 08/09/2020   Osteomyelitis of right foot ([place]) 08/08/2020   Leucocytosis 07/05/2020   Below-knee amputation of left lower extremity ([place]) 06/16/2020   S/P BKA (below knee amputation) bilateral ([place]) 06/08/2020   Ischemic ulcer of toe of right foot ([place]) 04/13/2020   PVD (peripheral vascular disease)/ S/p BKA 04/13/2020   ESRD on hemodialysis ([place]) 01/27/2020   Abdominal wall cellulitis    Panniculitis 12/29/2019   Osteomyelitis ([place]) 10/15/2019   Hyperglycemia due to diabetes mellitus ([place]) 10/15/2019   Osteomyelitis of third toe of right foot ([place]) 10/14/2019   ESRD (end stage renal disease) ([place])    Asthma, mild persistent 07/25/2015   Hypertension secondary to other renal disorders 07/25/2015   Controlled type 2 diabetes mellitus with complication, with long-term current use of insulin ([place]) 03/30/2015   Anemia of chronic disease 02/17/2015   Osteopathy in diseases classified elsewhere, unspecified site 02/17/2015   Essential (primary) hypertension 06/24/2014   Heart failure ([employer]) 06/24/2014   Obesity, Class III, BMI 40-49.9 (morbid obesity) ([place]) 06/24/2014    Conditions to be addressed/monitored: HTN, DMII, ESRD, and PVD and multiple areas of non healing wounds/gangrene  Care Plan : RNCM: Amputation  Updates made by Ilean China, RN since 12/04/2020 12:00 AM     Problem: Right BKA   Priority: Medium     Long-Range Goal: Maintain Independence and Quality of Life s/p Amputation   Start Date: 09/07/2020  Recent Progress: On track  Priority: Medium  Note:   Current Barriers:  Care Coordination needs related to Right BKA in a patient with diabetes, ESRD, and Left BKA Chronic Disease Management support and education needs related to wound care Unable to drive on her own Unable to perform  IADLs independently  Nurse Case Manager Clinical Goal(s):  patient will verbalize understanding of plan for wound care  patient will work with vascular surgeon to address needs related to medical management of right BKA patient will meet with RN Care Manager to address wound management and physical challenges related to bilateral BKA s/p right BKA Patient will work with home health nursing, PT, OT for wound care and improvement in strength and function  Interventions:  1:1 collaboration with Claretta Fraise, MD regarding development and update of comprehensive plan of care as evidenced by provider attestation and co-signature Inter-disciplinary care team collaboration (see longitudinal plan of care) Chart reviewed including relevant office notes and lab results Medications reviewed and discussed importance of compliance Talked with daughter, [name], as patient was at dialysis at time of call Assessed for depression or anxiety related to recent amputation and resulting change in mobility and ability to perform ADLs Daughter says that she is doing well and has a positive outlook Previously talked with patient regarding this and she seemed very positive and felt that she will still be  able to manage ADLs after some work with physical and occupational therapy Discussed current overall health status.  Reviewed Abd CT results from 11/02/20 and last PCP office note. Discussed with daughter, Christy Zhang. Patient was told at visit that she had a spot on her kidney. Reviewed upcoming appointments and the reason for those appts. Advised that patient needs an MRI to f/u on Kidney mass.  RNCM collaborated with PCP regarding kidney mass and recommendation for MRI follow-up Encouraged to talk with LCSW as needed regarding any psychosocial concern Asked if daughter had any additional questions or concerns and encouraged to reach out to CCM team as needed  Self Care Activities:  Self administers medications as  prescribed Attends all scheduled provider appointments Performs ADL's independently Calls provider office for new concerns or questions  Patient Goals Over the next 30 days, patient will: Work with home health nursing, PT, and OT for wound care and on improving strength and ability to perform ADLs Call LCSW with any psychosocial concerns Call [place] as needed with any nursing care needs or questions Keep all medical appointments Monitor surgical wound for signs of infection and reach out to vascular surgeon if any develop Seek appropriate medical attention as needed for any new or worsening symptoms Use wheelchair for all ambulation   Follow Up Plan:  Telephone follow up appointment with care management team member scheduled for: 6/10 RNCM, 11/06/20 LCSW The patient has been provided with contact information for the care management team and has been advised to call with any health related questions or concerns.  Next PCP appointment scheduled for: 10/18/20 with Dr Livia Snellen     Care Plan : [place]  Updates made by Ilean China, RN since 12/04/2020 12:00 AM     Problem: Care Coordination and Chronic Disease Management Needs r/t ESRD, DM, HTN, and PVD   Priority: Medium     Goal: Work with [place] with ESRD, DM, HTN, and PVD   Recent Progress: On track  Priority: Medium  Note:   Current Barriers:  Care Coordination needs related to Level of care concerns and ADL IADL limitations in a patient with HTN, DMII, ESRD, and PVD with multiple amputations Chronic Disease Management support and education needs related to HTN, DMII, ESRD, and PVD with multiple amputations  RNCM Clinical Goal(s):  Patient will work with RN Case Manager to address needs related to HTN, DMII, ESRD, and PVD with multiple amputations and Level of care concerns and ADL IADL limitations through collaboration with RN Care manager,  provider, and care team.   Interventions: 1:1 collaboration with primary care provider regarding development and update of comprehensive plan of care as evidenced by provider attestation and co-signature Inter-disciplinary care team collaboration (see longitudinal plan of care)   Diabetes:  (Status: Condition stable. Not addressed this visit.) Lab Results  Component Value Date   HGBA1C 6.4 (H) 10/27/2020  Assessed patient's understanding of A1c goal: <7% Evaluation of current treatment plan related to diabetes and patient's adherence to plan as established by provider. Chart reviewed including relevant office notes and lab results Reinforced need to continue checking and recording blood sugar readings twice a day Discussed home readings and advised to call PCP with any readings below 70 or above 200 Reviewed upcoming appointments Reinforced a healthy low sodium and ADA/carb modified diet Discussed family/social support Has assistance from her adult daughters One daughter just had a baby on 09/06/20 Discussed need  for lower extremity amputation and therapuetic listening utilized.  Provided with RN Care manager contact number and encouraged to reach out as needed Reviewed upcoming appointments Encouraged to reach out to LCSW with psychosocial needs  ESRD  (Status: Goal on track: YES.) Lab Results  Component Value Date   CREATININE 8.16 (H) 11/28/2020   BUN 40 (H) 11/28/2020   NA 133 (L) 11/28/2020   K 4.5 11/28/2020   CL 90 (L) 11/28/2020   CO2 27 11/28/2020  CT Abd from 11/02/20 IMPRESSION: 3.5 cm heterogeneously enhancing abnormality is again noted arising from lower pole of left kidney concerning for possible neoplasm. Further evaluation with MRI with and without gadolinium is recommended.  Evaluation of current treatment plan related to ESRD, Level of care concerns and ADL IADL limitations, self-management and patient's adherence to plan as established by provider. Discussed  plans with patient for ongoing care management follow up and provided patient with direct contact information for care management team Evaluation of current treatment plan related to ESRD and patient's adherence to plan as established by provider; Reviewed [facility] inpatient records. Patient receiving dialysis Collaborated with patient's daughter, Christy Zhang, regarding current level of care Collaborated with primary hospitalist, Shelly Coss, MD, regarding MRI abd order. Believes patient will have that done today.   Hypertension: (Status: Condition stable. Not addressed this visit.) Last practice recorded BP readings:  BP Readings from Last 3 Encounters:  11/28/20 (!) 141/65  11/24/20 129/60  11/23/20 127/63  Most recent eGFR/CrCl:  Lab Results  Component Value Date   EGFR 10 (L) 10/18/2020    No components found for: CRCL  Evaluation of current treatment plan related to hypertension and patient's adherence to plan as established by provider. Chart reviewed including relevant office notes and lab results Reviewed recent in-office blood pressure readings. They are consistently high. Discussed home readings Patient doesn't check at home herself but home health nurse is checking twice a week for now and it is checked 3 times a week at dialysis Reviewed and discussed medications Patient reports being compliant with medications and cost isn't a problem as they are free Discussed importance of blood pressure control Recommended DASH diet Provided with RN Care Manager contact number and encouraged to reach out as needed Again discussed results of abd CT scan with daughter, Christy Zhang, and need for MRI to follow-up on enlarging left kidney mass that may be cancerous. Advised that the results of that scan may impact future medical decisions Previously collaborated with PCP and covering provider regarding need for MRI or urology referral to follow-up on mass Reviewed upcoming appointments:  Hematology/Oncology to discuss abnormal blood cell counts on 12/06/20 Collaborated via Secure Message with ED physician, Dr Manuella Ghazi, who agreed to order abdominal MRI to be done once transferred to [facility] from [facility]   PVD  (Status: Goal on track: YES.) Evaluation of current treatment plan related to  peripheral vascular disease and multiple amputations , Level of care concerns and ADL IADL limitations self-management and patient's adherence to plan as established by provider. Discussed plans with patient for ongoing care management follow up and provided patient with direct contact information for care management team Talked with patient's daughter, Christy Zhang, via telephone regarding current medical status Reviewed inpatient [facility] from [facility]. Left hand does not seem salvageable. Wound care orders for sacral decubitis.  Patient has home health support from [place] for after discharge Discussed family support from her two daughters Discussed ability to perform ADLs  Patient Goals/Self-Care Activities: Patient  will self administer medications as prescribed Patient will attend all scheduled provider appointments Patient will call provider office for new concerns or questions       Plan: Telephone follow up appointment with care management team member scheduled for:  12/11/2020 with RNCM and The patient has been provided with contact information for the care management team and has been advised to call with any health related questions or concerns.   Chong Sicilian, BSN, RN-BC Embedded Chronic Care Manager [facility] [place] Management Direct Dial: [phone]

## 2020-12-04 NOTE — Progress Notes (Signed)
7.5 mg IV Valium wasted at this time. Anselm Pancoast, RN as a witness.

## 2020-12-04 NOTE — Progress Notes (Signed)
Myoclonic "jerking" while still responsive. Valium ordered.

## 2020-12-04 NOTE — Plan of Care (Signed)
  Problem: Education: Goal: Knowledge of General Education information will improve Description: Including pain rating scale, medication(s)/side effects and non-pharmacologic comfort measures Outcome: Not Progressing   Problem: Health Behavior/Discharge Planning: Goal: Ability to manage health-related needs will improve Outcome: Not Progressing   Problem: Clinical Measurements: Goal: Ability to maintain clinical measurements within normal limits will improve Outcome: Not Progressing Goal: Will remain free from infection Outcome: Not Progressing Goal: Diagnostic test results will improve Outcome: Not Progressing Goal: Respiratory complications will improve Outcome: Not Progressing Goal: Cardiovascular complication will be avoided Outcome: Not Progressing   Problem: Activity: Goal: Risk for activity intolerance will decrease Outcome: Not Progressing   Problem: Nutrition: Goal: Adequate nutrition will be maintained Outcome: Not Progressing   Problem: Coping: Goal: Level of anxiety will decrease Outcome: Not Progressing   Problem: Elimination: Goal: Will not experience complications related to bowel motility Outcome: Not Progressing Goal: Will not experience complications related to urinary retention Outcome: Not Progressing   Problem: Safety: Goal: Ability to remain free from injury will improve Outcome: Not Progressing   Problem: Skin Integrity: Goal: Risk for impaired skin integrity will decrease Outcome: Not Progressing   

## 2020-12-04 NOTE — Progress Notes (Signed)
Patient ID: Christy Zhang, female   DOB: Jun 15, 1955, 65 y.o.   MRN: [id number]  [facility] Progress Note   Assessment/ Plan:   1.  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  She has agreed to bilateral above-knee amputations to be done at some point soon per vascular surgery.  2. ESRD:  - Continue HD per MWF schedule  - lokelma once today.  On renal diet.  - With her current wounds on the back/inability to sit in recliner, unable to undertake outpatient dialysis and will need LTAC per charting  3. Anemia CKD: Secondary to end-stage renal disease as well as malnutrition-inflammation complex from multiple wounds on her back/limb gangrene.  On aranesp 150 mcg every Monday   4. CKD-MBD: hyperphos with sevelamer, continue Hectorol for secondary hyperparathyroidism   5. Nutrition: Albumin level significantly depressed secondary to multifocal sites of inflammation.  Continue ONS.  6. Hypertension: controlled, monitor with HD/UF.  7. Leukocytosis - marked leukocytosis slightly improved. Abx per primary team.  Blood cultures NGTD.  Work-up per primary team  Subjective:   Seen and examined on dialysis.  Blood pressure 144/55 and HR 76.  On HD via Right arm graft.  Tolerating goal.  got low dose lokelma yesterday.   Review of systems:  Denies shortness of breath or chest pain  Denies n/v   Objective:   BP (!) 137/57   Pulse 78   Temp 98.1 F (36.7 C)   Resp 19   Ht '5\' 4"'$  (1.626 m)   Wt 89.8 kg   LMP  (LMP Unknown)   SpO2 97%   BMI 33.98 kg/m   Physical Exam:  General adult female in bed in no acute distress HEENT normocephalic atraumatic Neck supple trachea midline Lungs clear to auscultation bilaterally normal work of breathing at rest   Heart S1S2 no rub Abdomen soft nontender nondistended Extremities no edema residual limbs; bilateral BKA Psych normal mood and affect Neuro - awakens to voice and conversant with me; wants to know when getting off of HD today Access: RUE AVG in use  Labs: BMET Recent Labs  Lab 12/17/2020 1513 11/28/20 0532 11/29/20 [id number] 11/30/20 0316 12/01/20 0309 12/02/20 0437 12/02/20 1834 12/03/20 0254 12/04/20 0707  NA 130*   < > 131* 131* 133* 134* 134* 134* 133*  K 5.9*   < > 4.3 5.5* 5.1 4.6 4.0 5.4* 5.7*  CL 92*   < > 92* 93* 95* 95* 102 95* 92*  CO2 22   < > 24 19* 22 26 19* 23 25  GLUCOSE 124*   < > 120* 120* 99 145* 138* 154* 168*  BUN 83*   < > 54* 36* 42* 28* 32* 44* 63*  CREATININE 13.93*   < > 9.71* 6.62* 7.97* 5.87* 5.60* 7.31* 8.83*  CALCIUM 7.9*   < > 8.0* 8.9 8.7* 8.4* 6.9* 8.5* 8.6*  PHOS 9.7*  --  6.4* 4.6 6.0* 4.3  --  5.7* 5.7*   < > = values in this interval not displayed.   CBC Recent Labs  Lab 11/29/20 0927 12/02/20 0437 12/02/20 1529 12/02/20 1834 12/02/20 2304 12/04/20 0707  WBC 25.8*   < > 54.5* 44.2* 59.5* 53.8*  NEUTROABS 22.3*  --   --  39.5*  --   --  HGB 7.0*   < > 7.5* 6.4* 7.6*  7.6* 7.8*  HCT 22.2*   < > 23.4* 21.5* 24.7*  24.1* 25.2*  MCV 75.5*   < > 75.7* 78.8* 77.7* 75.7*  PLT 682*   < > 737* 623* 770* 756*   < > = values in this interval not displayed.      Medications:     (feeding supplement) PROSource Plus  30 mL Oral BID BM   sodium chloride   Intravenous Once   amiodarone  200 mg Oral Daily   aspirin EC  81 mg Oral QHS   atorvastatin  40 mg Oral QPM   Chlorhexidine Gluconate Cloth  6 each Topical Q0600   collagenase   Topical Daily   darbepoetin (ARANESP) injection - DIALYSIS  150 mcg Intravenous Q Mon-HD   doxercalciferol  1.5 mcg Intravenous Q M,W,F-HD   enoxaparin (LOVENOX) injection  90 mg Subcutaneous Q24H   feeding supplement (NEPRO CARB STEADY)  237 mL Oral BID BM   gabapentin  300 mg Oral QHS   heparin sodium  (porcine)       hydrocortisone  25 mg Rectal BID   insulin aspart  0-15 Units Subcutaneous TID WC   insulin detemir  16 Units Subcutaneous QHS   midodrine  5 mg Oral TID WC   multivitamin  1 tablet Oral QHS   ondansetron (ZOFRAN) IV  4 mg Intravenous Once   polyethylene glycol  17 g Oral Daily   senna-docusate  1 tablet Oral BID   sevelamer carbonate  2,400 mg Oral TID with meals   sodium chloride flush  3 mL Intravenous Q12H   sodium hypochlorite   Irrigation Daily   sodium zirconium cyclosilicate  5 g Oral Once   Claudia Desanctis, MD 12/04/2020, 10:58 AM

## 2020-12-04 NOTE — Progress Notes (Addendum)
PROGRESS NOTE    Christy TUFO   [name]  DOB: [birth year]  DOA: 12/08/2020 PCP: Claretta Fraise, MD   Brief Narrative:  Christy Zhang is a 65 old female with history of bilateral BKA, ESRD on dialysis on Monday/Wednesday/Friday, type 2 diabetes, hypertension, peripheral vascular disease, hyperlipidemia, recently discharged from here on 11/14/2020 after prolonged hospitalization when she was admitted with altered mental status and had to be intubated for airway protection and also underwent CRRT in the setting of septic shock from pneumonia causing acute respiratory failure with hypoxia, AKI.  She presented at [facility] hospital ED because it was too painful to sit down for dialysis due to her decubitus ulcer and she also missed 2 session of dialysis.  Patient was sent to [facility] from [facility] for further management.  Nephrology,orthopedics,palliative care vascular surgery consulted.   Subjective: Evaluated this AM on hydrotherapy. Sleepy and nonverbal.     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 (HCC) Wound on b/l stumps- Leukocytosis  - s/p thrombectomy of left radial artery thrombotic occlusion - vascular surgery has been following-   - vascular surgery recommending b/l AKA and patient and family in agreement of this-  - WBC rising from 25.8> 45.8 > 54- suspect multiple wounds are the cause-   - temps 100.3 on 8/6- started Vanc, Cefepime, Flagyl on 8/6- ordered blood cultures, UA and Urine culture prior to this - discussed the poor prognosis with the patient again - WBC has not improved with antibiotics - her daughter states she wants her transferred to [facility] and does not want surgery tomorrow    Active Problems:   Sacral and right hip decubitus ulcers - general surgery does not recommend debridement but has recommended hydrotherapy and santyl    ESRD- AOCD -  cont dialysis per nephrology on M/W/F via LUE fistula - she is unable to sit for dialysis and at this point we are not sure how she will receive outpt dialysis  Hyperkalemia - is not going to be receiving dialysis today- K 5.4- started [facility]  - K is higher today    Obesity, Class III, BMI 40-49.9 (morbid obesity) ([place])  Body mass index is 33.98 kg/m.    Paroxysmal atrial fibrillation (HCC) - cont amiodarone- eliquis on hold   Left renal mass - MRI abdomen attempted  x 4 - I have cancelled it  DM2 - cont Levemir and SSI Hemoglobin A1C    Component Value Date/Time   HGBA1C 6.4 (H) 10/27/2020 1848   HGBA1C 5.4 04/06/2020 1105     Time spent in minutes: 35 DVT prophylaxis: holding Code Status: partial code Family Communication:  Level of Care: Level of care: Telemetry Medical Disposition Plan:  Status is: Inpatient  Remains inpatient appropriate because:IV treatments appropriate due to intensity of illness or inability to take PO  Dispo: The patient is from: Home              Anticipated d/c is to:  TBD              Patient currently is not medically stable to d/c.   Difficult to place patient No   Consultants:  Nephrology Gen surgery Vascular surgery Procedures:   Antimicrobials:  Anti-infectives (From admission, onward)    Start     Dose/Rate Route Frequency Ordered Stop   12/04/20 1200  vancomycin (VANCOCIN) IVPB 1000 mg/200 mL premix  1,000 mg 200 mL/hr over 60 Minutes Intravenous Every M-W-F (Hemodialysis) 12/02/20 1734     12/03/20 1800  ceFEPIme (MAXIPIME) 1 g in sodium chloride 0.9 % 100 mL IVPB        1 g 200 mL/hr over 30 Minutes Intravenous Every 24 hours 12/02/20 1734     12/02/20 1830  vancomycin (VANCOREADY) IVPB 1750 mg/350 mL        1,750 mg 175 mL/hr over 120 Minutes Intravenous  Once 12/02/20 1734 12/02/20 2156   12/02/20 1800  ceFEPIme (MAXIPIME) 2 g in sodium chloride 0.9 % 100 mL IVPB        2 g 200 mL/hr over 30 Minutes  Intravenous  Once 12/02/20 1707 12/02/20 2003   12/02/20 1800  metroNIDAZOLE (FLAGYL) IVPB 500 mg        500 mg 100 mL/hr over 60 Minutes Intravenous Every 8 hours 12/02/20 1707     12/02/20 1800  vancomycin (VANCOCIN) IVPB 1000 mg/200 mL premix  Status:  Discontinued        1,000 mg 200 mL/hr over 60 Minutes Intravenous  Once 12/02/20 1707 12/02/20 1734        Objective: Vitals:   12/04/20 1230 12/04/20 1300 12/04/20 1330 12/04/20 1430  BP: (!) 117/55 (!) 100/52 (!) 105/50 (!) 108/53  Pulse: 83 81 86 87  Resp:      Temp:      TempSrc:      SpO2:      Weight:      Height:        Intake/Output Summary (Last 24 hours) at 12/04/2020 1512 Last data filed at 12/04/2020 1008 Gross per 24 hour  Intake 546.6 ml  Output --  Net 546.6 ml    Filed Weights   11/28/20 0517  Weight: 89.8 kg    Examination: General exam: Appears comfortable  HEENT: PERRLA, oral mucosa moist, no sclera icterus or thrush Respiratory system: Clear to auscultation. Respiratory effort normal. Cardiovascular system: S1 & S2 heard, regular rate and rhythm Gastrointestinal system: Abdomen soft, non-tender, nondistended. Normal bowel sounds   Central nervous system: Alert and oriented. No focal neurological deficits. Extremities: No cyanosis, clubbing - b/l BKA, left hand is severely gangrenous Skin: number wounds on back hips, stumps with foul smelling discahrge Psychiatry:  Mood & affect appropriate.       Data Reviewed: I have personally reviewed following labs and imaging studies  CBC: Recent Labs  Lab 11/29/20 0927 12/02/20 0437 12/02/20 1529 12/02/20 1834 12/02/20 2304 12/04/20 0707  WBC 25.8* 45.8* 54.5* 44.2* 59.5* 53.8*  NEUTROABS 22.3*  --   --  39.5*  --   --   HGB 7.0* 7.4* 7.5* 6.4* 7.6*  7.6* 7.8*  HCT 22.2* 24.8* 23.4* 21.5* 24.7*  24.1* 25.2*  MCV 75.5* 77.0* 75.7* 78.8* 77.7* 75.7*  PLT 682* 720* 737* 623* 770* 756*    Basic Metabolic Panel: Recent Labs  Lab  11/28/20 0532 11/29/20 0926 11/30/20 0316 12/01/20 0309 12/02/20 0437 12/02/20 1834 12/03/20 0254 12/04/20 0707  NA 133*   < > 131* 133* 134* 134* 134* 133*  K 4.5   < > 5.5* 5.1 4.6 4.0 5.4* 5.7*  CL 90*   < > 93* 95* 95* 102 95* 92*  CO2 27   < > 19* 22 26 19* 23 25  GLUCOSE 70   < > 120* 99 145* 138* 154* 168*  BUN 40*   < > 36* 42* 28* 32* 44* 63*  CREATININE 8.16*   < >  6.62* 7.97* 5.87* 5.60* 7.31* 8.83*  CALCIUM 8.8*   < > 8.9 8.7* 8.4* 6.9* 8.5* 8.6*  MG 2.0  --   --   --   --   --   --   --   PHOS  --    < > 4.6 6.0* 4.3  --  5.7* 5.7*   < > = values in this interval not displayed.    GFR: Estimated Creatinine Clearance: 7.1 mL/min (A) (by C-G formula based on SCr of 8.83 mg/dL (H)). Liver Function Tests: Recent Labs  Lab 12/01/20 0309 12/02/20 0437 12/02/20 1834 12/03/20 0254 12/04/20 0707  AST  --   --  24  --   --   ALT  --   --  10  --   --   ALKPHOS  --   --  93  --   --   BILITOT  --   --  0.6  --   --   PROT  --   --  5.5*  --   --   ALBUMIN 1.9* 1.8* 1.4* 1.7* 1.6*    No results for input(s): LIPASE, AMYLASE in the last 168 hours.  No results for input(s): AMMONIA in the last 168 hours. Coagulation Profile: Recent Labs  Lab 12/02/20 1834  INR 1.5*    Cardiac Enzymes: No results for input(s): CKTOTAL, CKMB, CKMBINDEX, TROPONINI in the last 168 hours. BNP (last 3 results) No results for input(s): PROBNP in the last 8760 hours. HbA1C: No results for input(s): HGBA1C in the last 72 hours. CBG: Recent Labs  Lab 12/03/20 0636 12/03/20 1208 12/03/20 1610 12/03/20 2111 12/04/20 0637  GLUCAP 142* 158* 164* 200* 174*    Lipid Profile: No results for input(s): CHOL, HDL, LDLCALC, TRIG, CHOLHDL, LDLDIRECT in the last 72 hours. Thyroid Function Tests: No results for input(s): TSH, T4TOTAL, FREET4, T3FREE, THYROIDAB in the last 72 hours. Anemia Panel: No results for input(s): VITAMINB12, FOLATE, FERRITIN, TIBC, IRON, RETICCTPCT in the last 72  hours. Urine analysis:    Component Value Date/Time   COLORURINE AMBER (A) 10/28/2020 1322   APPEARANCEUR TURBID (A) 10/28/2020 1322   LABSPEC 1.014 10/28/2020 1322   PHURINE 5.0 10/28/2020 1322   GLUCOSEU NEGATIVE 10/28/2020 1322   HGBUR MODERATE (A) 10/28/2020 1322   BILIRUBINUR NEGATIVE 10/28/2020 1322   KETONESUR NEGATIVE 10/28/2020 1322   PROTEINUR 100 (A) 10/28/2020 1322   NITRITE NEGATIVE 10/28/2020 1322   LEUKOCYTESUR LARGE (A) 10/28/2020 1322   Sepsis Labs: '@LABRCNTIP'$ (procalcitonin:4,lacticidven:4) ) Recent Results (from the past 240 hour(s))  Resp Panel by RT-PCR (Flu A&B, Covid) Nasopharyngeal Swab     Status: None   Collection Time: 12/24/2020 10:26 AM   Specimen: Nasopharyngeal Swab; Nasopharyngeal(NP) swabs in vial transport medium  Result Value Ref Range Status   SARS Coronavirus 2 by RT PCR NEGATIVE NEGATIVE Final    Comment: (NOTE) SARS-CoV-2 target nucleic acids are NOT DETECTED.  The SARS-CoV-2 RNA is generally detectable in upper respiratory specimens during the acute phase of infection. The lowest concentration of SARS-CoV-2 viral copies this assay can detect is 138 copies/mL. A negative result does not preclude SARS-Cov-2 infection and should not be used as the sole basis for treatment or other patient management decisions. A negative result may occur with  improper specimen collection/handling, submission of specimen other than nasopharyngeal swab, presence of viral mutation(s) within the areas targeted by this assay, and inadequate number of viral copies(<138 copies/mL). A negative result must be combined with clinical observations,  patient history, and epidemiological information. The expected result is Negative.  Fact Sheet for Patients:  EntrepreneurPulse.com.au  Fact Sheet for Healthcare Providers:  IncredibleEmployment.be  This test is no t yet approved or cleared by the [place] FDA and  has been  authorized for detection and/or diagnosis of SARS-CoV-2 by FDA under an Emergency Use Authorization (EUA). This EUA will remain  in effect (meaning this test can be used) for the duration of the COVID-19 declaration under Section 564(b)(1) of the Act, 21 U.S.C.section 360bbb-3(b)(1), unless the authorization is terminated  or revoked sooner.       Influenza A by PCR NEGATIVE NEGATIVE Final   Influenza B by PCR NEGATIVE NEGATIVE Final    Comment: (NOTE) The Xpert Xpress SARS-CoV-2/FLU/RSV plus assay is intended as an aid in the diagnosis of influenza from Nasopharyngeal swab specimens and should not be used as a sole basis for treatment. Nasal washings and aspirates are unacceptable for Xpert Xpress SARS-CoV-2/FLU/RSV testing.  Fact Sheet for Patients: EntrepreneurPulse.com.au  Fact Sheet for Healthcare Providers: IncredibleEmployment.be  This test is not yet approved or cleared by the [place] FDA and has been authorized for detection and/or diagnosis of SARS-CoV-2 by FDA under an Emergency Use Authorization (EUA). This EUA will remain in effect (meaning this test can be used) for the duration of the COVID-19 declaration under Section 564(b)(1) of the Act, 21 U.S.C. section 360bbb-3(b)(1), unless the authorization is terminated or revoked.  Performed at [facility], [address], [place], [place] [address]   Blood culture (routine x 2)     Status: None   Collection Time: 12/20/2020 10:45 AM   Specimen: BLOOD  Result Value Ref Range Status   Specimen Description BLOOD LEFT ARM  Final   Special Requests   Final    BOTTLES DRAWN AEROBIC AND ANAEROBIC Blood Culture adequate volume   Culture   Final    NO GROWTH 5 DAYS Performed at [facility], [address], [place], [place] [address]    Report Status 12/02/2020 FINAL  Final  Blood culture (routine x 2)     Status: None   Collection Time: 12/24/2020 12:42 PM   Specimen: Left  Antecubital; Blood  Result Value Ref Range Status   Specimen Description LEFT ANTECUBITAL  Final   Special Requests   Final    Blood Culture adequate volume BOTTLES DRAWN AEROBIC AND ANAEROBIC   Culture   Final    NO GROWTH 5 DAYS Performed at [facility], [address] [place], [place] [address]    Report Status 12/02/2020 FINAL  Final  MRSA Next Gen by PCR, Nasal     Status: Abnormal   Collection Time: 11/28/20  1:41 AM   Specimen: Nasal Mucosa; Nasal Swab  Result Value Ref Range Status   MRSA by PCR Next Gen DETECTED (A) NOT DETECTED Final    Comment: RESULT CALLED TO, READ BACK BY AND VERIFIED WITH: Starr Sinclair RN, AT [phone] 11/28/20 D. VANHOOK (NOTE) The GeneXpert MRSA Assay (FDA approved for NASAL specimens only), is one component of a comprehensive MRSA colonization surveillance program. It is not intended to diagnose MRSA infection nor to guide or monitor treatment for MRSA infections. Test performance is not FDA approved in patients less than 29 years old. Performed at [facility], [place] [address], [place],  [address]   Culture, blood (x 2)     Status: None (Preliminary result)   Collection Time: 12/02/20  6:34 PM   Specimen: BLOOD  Result Value Ref Range Status   Specimen Description BLOOD LEFT ANTECUBITAL  Final   Special Requests AEROBIC BOTTLE ONLY Blood Culture adequate volume  Final   Culture   Final    NO GROWTH < 12 HOURS Performed at [facility], [address], [place], [place] [address]    Report Status PENDING  Incomplete  Culture, blood (x 2)     Status: None (Preliminary result)   Collection Time: 12/02/20  6:34 PM   Specimen: BLOOD  Result Value Ref Range Status   Specimen Description BLOOD LEFT ANTECUBITAL  Final   Special Requests IN PEDIATRIC BOTTLE Blood Culture adequate volume  Final   Culture   Final    NO GROWTH < 12 HOURS Performed at [facility], [place] [address], [place], [place] [address]    Report Status PENDING   Incomplete         Radiology Studies: No results found.    Scheduled Meds:  (feeding supplement) PROSource Plus  30 mL Oral BID BM   sodium chloride   Intravenous Once   amiodarone  200 mg Oral Daily   aspirin EC  81 mg Oral QHS   atorvastatin  40 mg Oral QPM   Chlorhexidine Gluconate Cloth  6 each Topical Q0600   collagenase   Topical Daily   darbepoetin (ARANESP) injection - DIALYSIS  150 mcg Intravenous Q Mon-HD   doxercalciferol  1.5 mcg Intravenous Q M,W,F-HD   enoxaparin (LOVENOX) injection  90 mg Subcutaneous Q24H   feeding supplement (NEPRO CARB STEADY)  237 mL Oral BID BM   gabapentin  300 mg Oral QHS   hydrocortisone  25 mg Rectal BID   insulin aspart  0-15 Units Subcutaneous TID WC   insulin detemir  16 Units Subcutaneous QHS   midodrine  5 mg Oral TID WC   multivitamin  1 tablet Oral QHS   ondansetron (ZOFRAN) IV  4 mg Intravenous Once   polyethylene glycol  17 g Oral Daily   senna-docusate  1 tablet Oral BID   sevelamer carbonate  2,400 mg Oral TID with meals   sodium chloride flush  3 mL Intravenous Q12H   sodium hypochlorite   Irrigation Daily   [START ON 12/15/2020] sodium hypochlorite   Topical Daily   sodium zirconium cyclosilicate  10 g Oral Once   Continuous Infusions:  sodium chloride     sodium chloride     sodium chloride 10 mL/hr at 12/04/20 0300   ceFEPime (MAXIPIME) IV Stopped (12/03/20 1825)   metronidazole Stopped (12/04/20 [id number])   vancomycin 1,000 mg (12/04/20 1336)     LOS: 7 days      Debbe Odea, MD [facility] Pager: [email or web address] 12/04/2020, 3:12 PM

## 2020-12-04 NOTE — Progress Notes (Signed)
She continues to decline. She is somnolent on my evaluation in the dialysis unit. She will awaken briefly and answer appropriately, but then falls back asleep. Family desires aggressive care. Will plan to do bilateral above knee amputations for her in the OR tomorrow. NPO after midnight.  Christy Zhang. Stanford Breed, MD [facility] Phone Number: [phone] 12/04/2020 12:58 PM

## 2020-12-04 NOTE — Progress Notes (Signed)
Physical Therapy Wound Treatment Patient Details  Name: MICHAEL VENTRESCA MRN: [id number] Date of Birth: Jan 29, 1956  Today's Date: 12/04/2020 Time: 2458-0998 Time Calculation (min): 60 min  Subjective  Subjective Assessment Subjective: Lethargic but agreeable to hydrotherapy. Premedicated. Patient and Family Stated Goals: None stated Date of Onset:  (Unknown) Prior Treatments:  (Dressing changes)  Pain Score:  Tolerated treatment well with minimal complaints of pain. Premedicated with IV pain meds.   Wound Assessment  Pressure Injury 11/28/20 Sacrum Unstageable - Full thickness tissue loss in which the base of the injury is covered by slough (yellow, tan, gray, green or brown) and/or eschar (tan, brown or black) in the wound bed. (Active)  Dressing Type Barrier Film (skin prep);Dakin's-soaked gauze;Gauze (Comment);Foam - Lift dressing to assess site every shift;Moist to moist 12/04/20 1037  Dressing Changed;Clean;Dry;Intact 12/04/20 1037  Dressing Change Frequency Daily 12/04/20 1037  State of Healing Eschar 12/04/20 1037  Site / Wound Assessment Black;Pink;Yellow 12/04/20 1037  % Wound base Red or Granulating 25% 12/04/20 1037  % Wound base Yellow/Fibrinous Exudate 25% 12/04/20 1037  % Wound base Black/Eschar 50% 12/04/20 1037  % Wound base Other/Granulation Tissue (Comment) 0% 12/04/20 1037  Peri-wound Assessment Intact 12/04/20 1037  Wound Length (cm) 14 cm 11/29/20 1346  Wound Width (cm) 9 cm 11/29/20 1346  Wound Depth (cm) 1.8 cm 11/29/20 1346  Wound Surface Area (cm^2) 126 cm^2 11/29/20 1346  Wound Volume (cm^3) 226.8 cm^3 11/29/20 1346  Tunneling (cm) 0 11/29/20 1513  Undermining (cm) 0 11/29/20 1513  Margins Unattached edges (unapproximated) 12/04/20 1037  Drainage Amount Moderate 12/04/20 1037  Drainage Description Purulent;Green;Odor 12/04/20 1037  Treatment Debridement (Selective);Hydrotherapy (Pulse lavage);Packing (Saline gauze) 12/04/20 1037     Pressure Injury  11/28/20 Flank Right Unstageable - Full thickness tissue loss in which the base of the injury is covered by slough (yellow, tan, gray, green or brown) and/or eschar (tan, brown or black) in the wound bed. (Active)  Dressing Type Barrier Film (skin prep);Dakin's-soaked gauze;Foam - Lift dressing to assess site every shift;Gauze (Comment);Moist to moist 12/04/20 1037  Dressing Changed;Clean;Dry;Intact 12/04/20 1037  Dressing Change Frequency Daily 12/04/20 1037  State of Healing Eschar 12/04/20 1037  Site / Wound Assessment Yellow;Black;Pink 12/04/20 1037  % Wound base Red or Granulating 10% 12/04/20 1037  % Wound base Yellow/Fibrinous Exudate 60% 12/04/20 1037  % Wound base Black/Eschar 30% 12/04/20 1037  % Wound base Other/Granulation Tissue (Comment) 0% 12/04/20 1037  Peri-wound Assessment Intact 12/04/20 1037  Wound Length (cm) 7 cm 11/29/20 1346  Wound Width (cm) 18 cm 11/29/20 1346  Wound Depth (cm) 0.1 cm 11/29/20 1346  Wound Surface Area (cm^2) 126 cm^2 11/29/20 1346  Wound Volume (cm^3) 12.6 cm^3 11/29/20 1346  Tunneling (cm) 0 11/29/20 1513  Undermining (cm) 0 11/29/20 1513  Margins Unattached edges (unapproximated) 12/04/20 1037  Drainage Amount Minimal 12/04/20 1037  Drainage Description Purulent;Green;Odor 12/04/20 1037  Treatment Debridement (Selective);Hydrotherapy (Pulse lavage);Packing (Saline gauze) 12/04/20 1037     Pressure Injury 11/28/20 Hip Right;Lateral Unstageable - Full thickness tissue loss in which the base of the injury is covered by slough (yellow, tan, gray, green or brown) and/or eschar (tan, brown or black) in the wound bed. (Active)  Dressing Type Barrier Film (skin prep);Dakin's-soaked gauze;Foam - Lift dressing to assess site every shift;Gauze (Comment);Moist to moist 12/04/20 1037  Dressing Changed;Clean;Dry;Intact 12/04/20 1037  Dressing Change Frequency Daily 12/04/20 1037  State of Healing Eschar 12/02/20 1900  Site / Wound Assessment Black;Yellow  12/04/20 1037  %  Wound base Red or Granulating 0% 12/04/20 1037  % Wound base Yellow/Fibrinous Exudate 75% 12/04/20 1037  % Wound base Black/Eschar 25% 12/04/20 1037  % Wound base Other/Granulation Tissue (Comment) 0% 12/04/20 1037  Peri-wound Assessment Intact 12/04/20 1037  Wound Length (cm) 5 cm 11/29/20 1346  Wound Width (cm) 4.5 cm 11/29/20 1346  Wound Depth (cm) 0.1 cm 11/29/20 1346  Wound Surface Area (cm^2) 22.5 cm^2 11/29/20 1346  Wound Volume (cm^3) 2.25 cm^3 11/29/20 1346  Tunneling (cm) 0 11/29/20 1513  Undermining (cm) 0 11/29/20 1513  Margins Unattached edges (unapproximated) 12/04/20 1037  Drainage Amount Minimal 12/04/20 1037  Drainage Description Serosanguineous 12/04/20 1037  Treatment Debridement (Selective);Hydrotherapy (Pulse lavage);Packing (Saline gauze) 12/04/20 1037      Hydrotherapy Pulsed lavage therapy - wound location: Sacrum, R hip, and R flank Pulsed Lavage with Suction (psi): 4 psi Pulsed Lavage with Suction - Normal Saline Used: 2000 mL Pulsed Lavage Tip: Tip with splash shield Selective Debridement Selective Debridement - Location: Sacrum, R hip, and R flank Selective Debridement - Tools Used: Forceps, Scalpel, Scissors Selective Debridement - Tissue Removed: Black eschar and yellow unviable tissue    Wound Assessment and Plan  Wound Therapy - Assess/Plan/Recommendations Wound Therapy - Clinical Statement: Progressing with debridement. Drainage and odor somewhat improved this session with addition of Dakin's. Noted L hip wound now 100% necrotic. Consulted with [facility] team to get this wound addressed with hydrotherapy starting next session. Will continue to benefit from hydrotherapy for selective removal of unviable tissue, to decrease bioburden, and promote wound bed healing. Wound Therapy - Functional Problem List: Decreased tolerance for position changes and OOB Factors Delaying/Impairing Wound Healing: Diabetes Mellitus, Immobility, Multiple medical  problems Hydrotherapy Plan: Debridement, Dressing change, Patient/family education, Pulsatile lavage with suction Wound Therapy - Frequency: 6X / week Wound Therapy - Follow Up Recommendations: dressing changes by RN  Wound Therapy Goals- Improve the function of patient's integumentary system by progressing the wound(s) through the phases of wound healing (inflammation - proliferation - remodeling) by: Wound Therapy Goals - Improve the function of patient's integumentary system by progressing the wound(s) through the phases of wound healing by: Decrease Necrotic Tissue to: 20% Decrease Necrotic Tissue - Progress: Progressing toward goal Increase Granulation Tissue to: 80% Increase Granulation Tissue - Progress: Progressing toward goal Goals/treatment plan/discharge plan were made with and agreed upon by patient/family: Yes Time For Goal Achievement: 7 days Wound Therapy - Potential for Goals: Good  Goals will be updated until maximal potential achieved or discharge criteria met.  Discharge criteria: when goals achieved, discharge from hospital, MD decision/surgical intervention, no progress towards goals, refusal/missing three consecutive treatments without notification or medical reason.  GP     Charges PT Wound Care Charges $Wound Debridement up to 20 cm: < or equal to 20 cm $ Wound Debridement each add'l 20 sqcm: 9 $PT PLS Gun and Tip: 1 Supply $PT Hydrotherapy Visit: 2 Visits       Thelma Comp 12/04/2020, 2:56 PM  Rolinda Roan, PT, DPT Acute Rehabilitation Services Pager: [phone] Office: [phone]

## 2020-12-04 NOTE — Progress Notes (Signed)
Sacral foams changed this AM. Unable to unpack the wound beds this AM due to pain and patient's wishes. Patient was not amenable to full wound care stating "stop, stop, please stop". Sacral foams were changed and peri-wounds were cleaned as best as possible. Plan for hydrotherapy later today.

## 2020-12-04 NOTE — Patient Instructions (Signed)
Visit Information  PATIENT GOALS:  Goals Addressed             This Visit's Progress    Maintain Independence and Quallity of Life: PVD with Multiple Amputations       Timeframe:  Long-Range Goal Priority:  Medium Start Date:  09/22/20                           Expected End Date:                        Follow Up: 12/02/2020  Work with PCP and specialist regarding gangrene of left hand and poorly healing BKA Work with PCP to schedule f/u on kidney mass Keep all medical appointments Call [facility] as needed [phone] Work with home health PT and OT         Patient verbalizes understanding of instructions provided today and agrees to view in [place].   Plan: Telephone follow up appointment with care management team member scheduled for:  12/23/2020 with RNCM and The patient has been provided with contact information for the care management team and has been advised to call with any health related questions or concerns.   Chong Sicilian, BSN, RN-BC Embedded Chronic Care Manager [facility] [place] [facility] Direct Dial: [phone]

## 2020-12-04 NOTE — Plan of Care (Signed)
  Problem: Activity: Goal: Risk for activity intolerance will decrease Outcome: Not Progressing   Problem: Nutrition: Goal: Adequate nutrition will be maintained Outcome: Not Progressing   

## 2020-12-05 ENCOUNTER — Inpatient Hospital Stay (HOSPITAL_COMMUNITY): Payer: Medicare Other | Admitting: Anesthesiology

## 2020-12-05 ENCOUNTER — Encounter (HOSPITAL_COMMUNITY): Payer: Self-pay | Admitting: Internal Medicine

## 2020-12-05 ENCOUNTER — Encounter (HOSPITAL_COMMUNITY): Admission: EM | Disposition: E | Payer: Self-pay | Source: Home / Self Care | Attending: Internal Medicine

## 2020-12-05 ENCOUNTER — Telehealth: Payer: Self-pay | Admitting: Physician Assistant

## 2020-12-05 DIAGNOSIS — N186 End stage renal disease: Secondary | ICD-10-CM | POA: Diagnosis not present

## 2020-12-05 DIAGNOSIS — T8781 Dehiscence of amputation stump: Secondary | ICD-10-CM

## 2020-12-05 DIAGNOSIS — I480 Paroxysmal atrial fibrillation: Secondary | ICD-10-CM | POA: Diagnosis not present

## 2020-12-05 DIAGNOSIS — I96 Gangrene, not elsewhere classified: Secondary | ICD-10-CM | POA: Diagnosis not present

## 2020-12-05 HISTORY — PX: AMPUTATION: SHX166

## 2020-12-05 LAB — RENAL FUNCTION PANEL
Albumin: 1.9 g/dL — ABNORMAL LOW (ref 3.5–5.0)
Anion gap: 12 (ref 5–15)
BUN: 35 mg/dL — ABNORMAL HIGH (ref 8–23)
CO2: 25 mmol/L (ref 22–32)
Calcium: 8.7 mg/dL — ABNORMAL LOW (ref 8.9–10.3)
Chloride: 96 mmol/L — ABNORMAL LOW (ref 98–111)
Creatinine, Ser: 5.25 mg/dL — ABNORMAL HIGH (ref 0.44–1.00)
GFR, Estimated: 9 mL/min — ABNORMAL LOW (ref >60)
Glucose, Bld: 163 mg/dL — ABNORMAL HIGH (ref 70–99)
Phosphorus: 3.6 mg/dL (ref 2.5–4.6)
Potassium: 4.2 mmol/L (ref 3.5–5.1)
Sodium: 133 mmol/L — ABNORMAL LOW (ref 135–145)

## 2020-12-05 LAB — GLUCOSE, CAPILLARY
Glucose-Capillary: 157 mg/dL — ABNORMAL HIGH (ref 70–99)
Glucose-Capillary: 155 mg/dL — ABNORMAL HIGH (ref 70–99)
Glucose-Capillary: 144 mg/dL — ABNORMAL HIGH (ref 70–99)
Glucose-Capillary: 168 mg/dL — ABNORMAL HIGH (ref 70–99)
Glucose-Capillary: 156 mg/dL — ABNORMAL HIGH (ref 70–99)
Glucose-Capillary: 133 mg/dL — ABNORMAL HIGH (ref 70–99)

## 2020-12-05 LAB — PREPARE RBC (CROSSMATCH)

## 2020-12-05 LAB — SURGICAL PCR SCREEN
MRSA, PCR: POSITIVE — AB
Staphylococcus aureus: POSITIVE — AB

## 2020-12-05 SURGERY — AMPUTATION, ABOVE KNEE
Anesthesia: General | Site: Knee | Laterality: Bilateral

## 2020-12-05 MED ORDER — LIDOCAINE 2% (20 MG/ML) 5 ML SYRINGE
INTRAMUSCULAR | Status: AC
  Filled 2020-12-05: qty 5

## 2020-12-05 MED ORDER — MIDAZOLAM HCL 2 MG/2ML IJ SOLN
INTRAMUSCULAR | Status: AC
  Filled 2020-12-05: qty 2

## 2020-12-05 MED ORDER — LIDOCAINE 2% (20 MG/ML) 5 ML SYRINGE
INTRAMUSCULAR | Status: DC | PRN
  Administered 2020-12-05: 60 mg via INTRAVENOUS

## 2020-12-05 MED ORDER — DOUBLE ANTIBIOTIC 500-10000 UNIT/GM EX OINT
TOPICAL_OINTMENT | CUTANEOUS | Status: AC
  Filled 2020-12-05: qty 28.4

## 2020-12-05 MED ORDER — ONDANSETRON HCL 4 MG/2ML IJ SOLN: 4.0000 mg | Freq: Once | INTRAMUSCULAR | Status: DC | PRN

## 2020-12-05 MED ORDER — PHENYLEPHRINE 40 MCG/ML (10ML) SYRINGE FOR IV PUSH (FOR BLOOD PRESSURE SUPPORT)
PREFILLED_SYRINGE | INTRAVENOUS | Status: AC
  Filled 2020-12-05: qty 10

## 2020-12-05 MED ORDER — PHENYLEPHRINE HCL-NACL 20-0.9 MG/250ML-% IV SOLN
INTRAVENOUS | Status: DC | PRN
  Administered 2020-12-05: 40 ug/min via INTRAVENOUS

## 2020-12-05 MED ORDER — CHLORHEXIDINE GLUCONATE CLOTH 2 % EX PADS
6.0000 | MEDICATED_PAD | Freq: Every day | CUTANEOUS | Status: DC
  Administered 2020-12-06 – 2020-12-07 (×2): 6 via TOPICAL

## 2020-12-05 MED ORDER — PROPOFOL 10 MG/ML IV BOLUS
INTRAVENOUS | Status: DC | PRN
  Administered 2020-12-05: 100 mg via INTRAVENOUS
  Administered 2020-12-05: 30 mg via INTRAVENOUS

## 2020-12-05 MED ORDER — ORAL CARE MOUTH RINSE: 15.0000 mL | Freq: Once | OROMUCOSAL | Status: AC

## 2020-12-05 MED ORDER — FENTANYL CITRATE (PF) 100 MCG/2ML IJ SOLN: 25.0000 ug | INTRAMUSCULAR | Status: DC | PRN

## 2020-12-05 MED ORDER — ONDANSETRON HCL 4 MG/2ML IJ SOLN
INTRAMUSCULAR | Status: AC
  Filled 2020-12-05: qty 2

## 2020-12-05 MED ORDER — FENTANYL CITRATE (PF) 250 MCG/5ML IJ SOLN
INTRAMUSCULAR | Status: DC | PRN
  Administered 2020-12-05: 25 ug via INTRAVENOUS
  Administered 2020-12-05: 50 ug via INTRAVENOUS
  Administered 2020-12-05: 100 ug via INTRAVENOUS

## 2020-12-05 MED ORDER — FENTANYL CITRATE (PF) 250 MCG/5ML IJ SOLN
INTRAMUSCULAR | Status: AC
  Filled 2020-12-05: qty 5

## 2020-12-05 MED ORDER — SODIUM CHLORIDE 0.9 % IV SOLN: INTRAVENOUS | Status: DC

## 2020-12-05 MED ORDER — BACITRACIN-NEOMYCIN-POLYMYXIN OINTMENT TUBE
TOPICAL_OINTMENT | CUTANEOUS | Status: DC | PRN
  Administered 2020-12-05: 1 via TOPICAL

## 2020-12-05 MED ORDER — ONDANSETRON HCL 4 MG/2ML IJ SOLN
INTRAMUSCULAR | Status: DC | PRN
  Administered 2020-12-05: 4 mg via INTRAVENOUS

## 2020-12-05 MED ORDER — SODIUM CHLORIDE 0.9% IV SOLUTION: Freq: Once | INTRAVENOUS | Status: DC

## 2020-12-05 MED ORDER — PHENOL 1.4 % MT LIQD: 1.0000 | OROMUCOSAL | Status: DC | PRN

## 2020-12-05 MED ORDER — PHENYLEPHRINE 40 MCG/ML (10ML) SYRINGE FOR IV PUSH (FOR BLOOD PRESSURE SUPPORT)
PREFILLED_SYRINGE | INTRAVENOUS | Status: DC | PRN
  Administered 2020-12-05 (×2): 120 ug via INTRAVENOUS
  Administered 2020-12-05: 40 ug via INTRAVENOUS
  Administered 2020-12-05: 120 ug via INTRAVENOUS

## 2020-12-05 MED ORDER — 0.9 % SODIUM CHLORIDE (POUR BTL) OPTIME
TOPICAL | Status: DC | PRN
  Administered 2020-12-05: 1000 mL

## 2020-12-05 MED ORDER — ENOXAPARIN SODIUM 100 MG/ML IJ SOSY: 90.0000 mg | PREFILLED_SYRINGE | INTRAMUSCULAR | Status: DC

## 2020-12-05 MED ORDER — CHLORHEXIDINE GLUCONATE 0.12 % MT SOLN: 15.0000 mL | Freq: Once | OROMUCOSAL | Status: AC

## 2020-12-05 MED ORDER — CHLORHEXIDINE GLUCONATE 0.12 % MT SOLN
OROMUCOSAL | Status: AC
  Administered 2020-12-05: 15 mL via OROMUCOSAL
  Filled 2020-12-05: qty 15

## 2020-12-05 SURGICAL SUPPLY — 47 items
BAG COUNTER SPONGE SURGICOUNT (BAG) ×2 IMPLANT
BLADE SAW RECIP 87.9 MT (BLADE) ×3 IMPLANT
BNDG COHESIVE 4X5 TAN STRL (GAUZE/BANDAGES/DRESSINGS) ×2 IMPLANT
BNDG COHESIVE 6X5 TAN STRL LF (GAUZE/BANDAGES/DRESSINGS) ×2 IMPLANT
BNDG ELASTIC 4X5.8 VLCR STR LF (GAUZE/BANDAGES/DRESSINGS) ×2 IMPLANT
BNDG ELASTIC 6X5.8 VLCR STR LF (GAUZE/BANDAGES/DRESSINGS) ×2 IMPLANT
BNDG GAUZE ELAST 4 BULKY (GAUZE/BANDAGES/DRESSINGS) ×3 IMPLANT
CANISTER SUCT 3000ML PPV (MISCELLANEOUS) ×2 IMPLANT
CLIP VESOCCLUDE MED 6/CT (CLIP) IMPLANT
COVER SURGICAL LIGHT HANDLE (MISCELLANEOUS) ×2 IMPLANT
DRAIN CHANNEL 19F RND (DRAIN) IMPLANT
DRAPE BILATERAL LIMB T (DRAPES) ×1 IMPLANT
DRAPE HALF SHEET 40X57 (DRAPES) ×2 IMPLANT
DRAPE ORTHO SPLIT 77X108 STRL (DRAPES) ×2
DRAPE SURG ORHT 6 SPLT 77X108 (DRAPES) ×2 IMPLANT
DRSG ADAPTIC 3X8 NADH LF (GAUZE/BANDAGES/DRESSINGS) ×3 IMPLANT
ELECT CAUTERY BLADE 6.4 (BLADE) ×2 IMPLANT
ELECT REM PT RETURN 9FT ADLT (ELECTROSURGICAL) ×2
ELECTRODE REM PT RTRN 9FT ADLT (ELECTROSURGICAL) ×1 IMPLANT
EVACUATOR SILICONE 100CC (DRAIN) IMPLANT
GAUZE SPONGE 4X4 12PLY STRL (GAUZE/BANDAGES/DRESSINGS) ×2 IMPLANT
GLOVE SURG ENC MOIS LTX SZ7.5 (GLOVE) ×2 IMPLANT
GOWN STRL REUS W/ TWL LRG LVL3 (GOWN DISPOSABLE) ×3 IMPLANT
GOWN STRL REUS W/TWL LRG LVL3 (GOWN DISPOSABLE) ×3
KIT BASIN OR (CUSTOM PROCEDURE TRAY) ×2 IMPLANT
KIT TURNOVER KIT B (KITS) ×2 IMPLANT
NS IRRIG 1000ML POUR BTL (IV SOLUTION) ×2 IMPLANT
PACK GENERAL/GYN (CUSTOM PROCEDURE TRAY) ×2 IMPLANT
PAD ARMBOARD 7.5X6 YLW CONV (MISCELLANEOUS) ×4 IMPLANT
SLEEVE SURGEON STRL (DRAPES) ×1 IMPLANT
SPONGE T-LAP 18X18 ~~LOC~~+RFID (SPONGE) ×2 IMPLANT
STAPLER VISISTAT 35W (STAPLE) ×3 IMPLANT
STOCKINETTE IMPERVIOUS 9X36 MD (GAUZE/BANDAGES/DRESSINGS) ×2 IMPLANT
STOCKINETTE IMPERVIOUS LG (DRAPES) ×2 IMPLANT
SUT ETHILON 3 0 PS 1 (SUTURE) IMPLANT
SUT SILK 2 0 (SUTURE) ×1
SUT SILK 2 0 SH (SUTURE) IMPLANT
SUT SILK 2 0 SH CR/8 (SUTURE) ×4 IMPLANT
SUT SILK 2 0 TIES 10X30 (SUTURE) ×2 IMPLANT
SUT SILK 2-0 18XBRD TIE 12 (SUTURE) IMPLANT
SUT VIC AB 2-0 CT1 18 (SUTURE) IMPLANT
SUT VIC AB 2-0 SH 18 (SUTURE) ×6 IMPLANT
SUT VIC AB 3-0 SH 27 (SUTURE) ×2
SUT VIC AB 3-0 SH 27X BRD (SUTURE) ×1 IMPLANT
TOWEL GREEN STERILE (TOWEL DISPOSABLE) ×4 IMPLANT
UNDERPAD 30X36 HEAVY ABSORB (UNDERPADS AND DIAPERS) ×2 IMPLANT
WATER STERILE IRR 1000ML POUR (IV SOLUTION) ×2 IMPLANT

## 2020-12-05 NOTE — Interval H&P Note (Signed)
History and Physical Interval Note:  12/25/2020 9:14 AM  Carolynne Edouard  has presented today for surgery, with the diagnosis of Dry Gangrene.  The various methods of treatment have been discussed with the patient and family. After consideration of risks, benefits and other options for treatment, the patient has consented to  Procedure(s): BILATERAL ABOVE KNEE AMPUTATIONS (Bilateral) as a surgical intervention.  The patient's history has been reviewed, patient examined, no change in status, stable for surgery.  I have reviewed the patient's chart and labs.  Questions were answered to the patient's satisfaction.     Ruta Hinds

## 2020-12-05 NOTE — Anesthesia Postprocedure Evaluation (Signed)
Anesthesia Post Note  Patient: Christy Zhang  Procedure(s) Performed: BILATERAL ABOVE KNEE AMPUTATIONS (Bilateral: Knee)     Patient location during evaluation: PACU Anesthesia Type: General Level of consciousness: awake and alert Pain management: pain level controlled Vital Signs Assessment: post-procedure vital signs reviewed and stable Respiratory status: spontaneous breathing, nonlabored ventilation, respiratory function stable and patient connected to nasal cannula oxygen Cardiovascular status: blood pressure returned to baseline and stable Postop Assessment: no apparent nausea or vomiting Anesthetic complications: no   No notable events documented.  Last Vitals:  Vitals:   12/10/2020 1305 11/30/2020 1344  BP: (!) 106/52 (!) 122/47  Pulse:  79  Resp: 14 16  Temp:    SpO2: 100% 100%    Last Pain:  Vitals:   12/18/2020 1305  TempSrc:   PainSc: 0-No pain                 [name] A.

## 2020-12-05 NOTE — Anesthesia Preprocedure Evaluation (Addendum)
Anesthesia Evaluation  Patient identified by MRN, date of birth, ID band Patient awake    Reviewed: Allergy & Precautions, NPO status , Patient's Chart, lab work & pertinent test results, reviewed documented beta blocker date and time   Airway Mallampati: II  TM Distance: >3 FB Neck ROM: Full    Dental  (+) Poor Dentition, Missing, Loose   Pulmonary asthma , pneumonia, resolved, former smoker,    Pulmonary exam normal breath sounds clear to auscultation       Cardiovascular hypertension, Pt. on medications + Peripheral Vascular Disease and +CHF  Normal cardiovascular exam Rhythm:Regular Rate:Normal  Hx/o Cardiac arrest  EKG 12/10/2020 NSR, RAD, non specific ST-T wave abnormalities lateral leads  Echo 11/03/20 1. Left ventricular ejection fraction, by estimation, is 60 to 65%. The left ventricle has normal function. The left ventricle has no regional wall motion abnormalities. There is moderate left ventricular hypertrophy. Left ventricular diastolic parameters are indeterminate.  2. Right ventricular systolic function is mildly reduced. The right ventricular size is normal. Tricuspid regurgitation signal is inadequate for assessing PA pressure.  3. A small pericardial effusion is present.  4. The mitral valve is normal in structure. No evidence of mitral valve regurgitation. No evidence of mitral stenosis.  5. The aortic valve is tricuspid. Aortic valve regurgitation is not visualized. Mild aortic valve sclerosis is present, with no evidence of aortic valve stenosis.  6. The inferior vena cava is normal in size with <50% respiratory variability, suggesting right atrial pressure of 8 mmHg.   S/P thrombectomy left radial artery   Neuro/Psych negative neurological ROS  negative psych ROS   GI/Hepatic negative GI ROS, Neg liver ROS,   Endo/Other  diabetes, Poorly Controlled, Type 2, Insulin DependentHyperlipidemia  Renal/GU ESRF  and DialysisRenal diseaseLast dialysis yesterday  negative genitourinary   Musculoskeletal  (+) Arthritis , S/P bilateral BKA Dry gangrene left hand/fingers   Abdominal (+) + obese,   Peds  Hematology  (+) anemia , Eliquis therapy last dose 7/31   Anesthesia Other Findings Patient is non verbal  Reproductive/Obstetrics                            Anesthesia Physical Anesthesia Plan  ASA: 4  Anesthesia Plan: General   Post-op Pain Management:    Induction: Intravenous  PONV Risk Score and Plan: 4 or greater and Treatment may vary due to age or medical condition and Ondansetron  Airway Management Planned: LMA  Additional Equipment:   Intra-op Plan:   Post-operative Plan: Extubation in OR  Informed Consent: I have reviewed the patients History and Physical, chart, labs and discussed the procedure including the risks, benefits and alternatives for the proposed anesthesia with the patient or authorized representative who has indicated his/her understanding and acceptance.     Dental advisory given  Plan Discussed with: CRNA and Anesthesiologist  Anesthesia Plan Comments: (May need to transfuse PRBC's)        Anesthesia Quick Evaluation

## 2020-12-05 NOTE — Anesthesia Procedure Notes (Signed)
Procedure Name: LMA Insertion Date/Time: 12/21/2020 10:39 AM Performed by: Trinna Post., CRNA Pre-anesthesia Checklist: Patient identified, Emergency Drugs available, Suction available, Patient being monitored and Timeout performed Patient Re-evaluated:Patient Re-evaluated prior to induction Oxygen Delivery Method: Circle system utilized Preoxygenation: Pre-oxygenation with 100% oxygen Induction Type: IV induction Ventilation: Mask ventilation without difficulty LMA: LMA inserted LMA Size: 4.0 Number of attempts: 1 Placement Confirmation: positive ETCO2 and breath sounds checked- equal and bilateral Tube secured with: Tape Dental Injury: Teeth and Oropharynx as per pre-operative assessment

## 2020-12-05 NOTE — Progress Notes (Signed)
Physical Therapy Wound Treatment Patient Details  Name: Christy Zhang MRN: [id number] Date of Birth: Jul 27, 1955  Today's Date: 12/06/2020 Time: 1405-1506 Time Calculation (min): 61 min  Subjective  Subjective Assessment Subjective: Lethargic but agreeable to hydrotherapy. Premedicated. Patient and Family Stated Goals: None stated Date of Onset:  (Unknown) Prior Treatments:  (Dressing changes)  Pain Score: Premedicated. Restless towards end of session but did not appear to be in significant pain.   Wound Assessment  Pressure Injury 11/28/20 Sacrum Unstageable - Full thickness tissue loss in which the base of the injury is covered by slough (yellow, tan, gray, green or brown) and/or eschar (tan, brown or black) in the wound bed. (Active)  Dressing Type Barrier Film (skin prep);Dakin's-soaked gauze;Gauze (Comment);Moist to moist;Foam - Lift dressing to assess site every shift 12/26/2020 1514  Dressing Changed;Clean;Dry;Intact 12/24/2020 1514  Dressing Change Frequency Daily 12/13/2020 1514  State of Healing Eschar 12/08/2020 1514  Site / Wound Assessment Black;Pink;Yellow 12/24/2020 1514  % Wound base Red or Granulating 25% 12/14/2020 1514  % Wound base Yellow/Fibrinous Exudate 30% 12/03/2020 1514  % Wound base Black/Eschar 45% 12/27/2020 1514  % Wound base Other/Granulation Tissue (Comment) 0% 11/30/2020 1514  Peri-wound Assessment Intact 12/24/2020 1514  Wound Length (cm) 14 cm 11/29/20 1346  Wound Width (cm) 9 cm 11/29/20 1346  Wound Depth (cm) 1.8 cm 11/29/20 1346  Wound Surface Area (cm^2) 126 cm^2 11/29/20 1346  Wound Volume (cm^3) 226.8 cm^3 11/29/20 1346  Tunneling (cm) 0 11/29/20 1513  Undermining (cm) 0 11/29/20 1513  Margins Unattached edges (unapproximated) 12/07/2020 1514  Drainage Amount Moderate 12/11/2020 1514  Drainage Description Purulent;Green;Odor 11/28/2020 1514  Treatment Debridement (Selective);Hydrotherapy (Pulse lavage);Packing (Saline gauze) 12/10/2020 1514     Pressure Injury  11/28/20 Buttocks Left Unstageable - Full thickness tissue loss in which the base of the injury is covered by slough (yellow, tan, gray, green or brown) and/or eschar (tan, brown or black) in the wound bed. (Active)  Wound Image   12/06/2020 1514  Dressing Type Barrier Film (skin prep);Dakin's-soaked gauze;Foam - Lift dressing to assess site every shift;Gauze (Comment);Moist to moist 12/17/2020 1514  Dressing Changed;Clean;Dry;Intact 12/04/2020 1514  Dressing Change Frequency Daily 12/25/2020 1514  State of Healing Eschar 12/21/2020 1514  Site / Wound Assessment Black 12/24/2020 1514  % Wound base Red or Granulating 5% 12/08/2020 1514  % Wound base Yellow/Fibrinous Exudate 10% 12/10/2020 1514  % Wound base Black/Eschar 85% 12/14/2020 1514  % Wound base Other/Granulation Tissue (Comment) 0% 12/01/2020 1514  Peri-wound Assessment Intact 12/01/2020 1514  Wound Length (cm) 10.5 cm 12/25/2020 1400  Wound Width (cm) 10.2 cm 12/18/2020 1400  Wound Depth (cm) 0.1 cm 12/21/2020 1400  Wound Surface Area (cm^2) 107.1 cm^2 12/17/2020 1400  Wound Volume (cm^3) 10.71 cm^3 11/28/2020 1400  Tunneling (cm) 0 12/16/2020 1514  Undermining (cm) 0 12/13/2020 1514  Margins Unattached edges (unapproximated) 12/09/2020 1514  Drainage Amount Minimal 12/27/2020 1514  Drainage Description Purulent 12/06/2020 1514  Treatment Debridement (Selective);Hydrotherapy (Pulse lavage);Packing (Saline gauze) 12/13/2020 1514     Pressure Injury 11/28/20 Flank Right Unstageable - Full thickness tissue loss in which the base of the injury is covered by slough (yellow, tan, gray, green or brown) and/or eschar (tan, brown or black) in the wound bed. (Active)  Dressing Type Barrier Film (skin prep);Dakin's-soaked gauze;Foam - Lift dressing to assess site every shift;Gauze (Comment);Moist to moist 12/11/2020 1514  Dressing Changed 12/14/2020 1514  Dressing Change Frequency Daily 12/10/2020 1514  State of Healing Eschar 12/08/2020 1514  Site /  Wound Assessment Yellow;Black;Pink  12/24/2020 1514  % Wound base Red or Granulating 10% 12/04/20 1037  % Wound base Yellow/Fibrinous Exudate 60% 12/04/20 1037  % Wound base Black/Eschar 30% 12/04/20 1037  % Wound base Other/Granulation Tissue (Comment) 0% 12/04/20 1037  Peri-wound Assessment Intact 12/01/2020 1514  Wound Length (cm) 7 cm 11/29/20 1346  Wound Width (cm) 18 cm 11/29/20 1346  Wound Depth (cm) 0.1 cm 11/29/20 1346  Wound Surface Area (cm^2) 126 cm^2 11/29/20 1346  Wound Volume (cm^3) 12.6 cm^3 11/29/20 1346  Tunneling (cm) 0 11/29/20 1513  Undermining (cm) 0 11/29/20 1513  Margins Unattached edges (unapproximated) 12/18/2020 1514  Drainage Amount Minimal 12/12/2020 1514  Drainage Description Purulent;Green;Odor 12/15/2020 1514  Treatment Debridement (Selective);Hydrotherapy (Pulse lavage);Packing (Saline gauze) 12/17/2020 1514     Pressure Injury 11/28/20 Hip Right;Lateral Unstageable - Full thickness tissue loss in which the base of the injury is covered by slough (yellow, tan, gray, green or brown) and/or eschar (tan, brown or black) in the wound bed. (Active)  Dressing Type Barrier Film (skin prep);Foam - Lift dressing to assess site every shift;Gauze (Comment);Dakin's-soaked gauze;Moist to moist 12/01/2020 1514  Dressing Changed;Clean;Dry;Intact 12/13/2020 1514  Dressing Change Frequency Daily 12/12/2020 1514  State of Healing Eschar 12/07/2020 1514  Site / Wound Assessment Black;Yellow 12/07/2020 1514  % Wound base Red or Granulating 0% 12/14/2020 1514  % Wound base Yellow/Fibrinous Exudate 80% 12/25/2020 1514  % Wound base Black/Eschar 20% 12/22/2020 1514  % Wound base Other/Granulation Tissue (Comment) 0% 12/01/2020 1514  Peri-wound Assessment Intact 11/30/2020 1514  Wound Length (cm) 5 cm 11/29/20 1346  Wound Width (cm) 4.5 cm 11/29/20 1346  Wound Depth (cm) 0.1 cm 11/29/20 1346  Wound Surface Area (cm^2) 22.5 cm^2 11/29/20 1346  Wound Volume (cm^3) 2.25 cm^3 11/29/20 1346  Tunneling (cm) 0 11/29/20 1513  Undermining (cm) 0  11/29/20 1513  Margins Unattached edges (unapproximated) 12/04/2020 1514  Drainage Amount Minimal 12/07/2020 1514  Drainage Description Serosanguineous 12/16/2020 1514  Treatment Debridement (Selective);Hydrotherapy (Pulse lavage);Packing (Saline gauze) 12/26/2020 1514      Hydrotherapy Pulsed lavage therapy - wound location: Sacrum, R hip, L hip, and R flank Pulsed Lavage with Suction (psi): 8 psi Pulsed Lavage with Suction - Normal Saline Used: 2000 mL Pulsed Lavage Tip: Tip with splash shield Selective Debridement Selective Debridement - Location: Sacrum, R hip, L hip, and R flank Selective Debridement - Tools Used: Forceps, Scalpel, Scissors Selective Debridement - Tissue Removed: Black eschar and yellow unviable tissue    Wound Assessment and Plan  Wound Therapy - Assess/Plan/Recommendations Wound Therapy - Clinical Statement: Initiated L hip wound treatment today - see picture above. Will continue to benefit from hydrotherapy for selective removal of unviable tissue, to decrease bioburden, and promote wound bed healing. Wound Therapy - Functional Problem List: Decreased tolerance for position changes and OOB Factors Delaying/Impairing Wound Healing: Diabetes Mellitus, Immobility, Multiple medical problems Hydrotherapy Plan: Debridement, Dressing change, Patient/family education, Pulsatile lavage with suction Wound Therapy - Frequency: 6X / week Wound Therapy - Follow Up Recommendations: dressing changes by RN  Wound Therapy Goals- Improve the function of patient's integumentary system by progressing the wound(s) through the phases of wound healing (inflammation - proliferation - remodeling) by: Wound Therapy Goals - Improve the function of patient's integumentary system by progressing the wound(s) through the phases of wound healing by: Decrease Necrotic Tissue to: 20% Decrease Necrotic Tissue - Progress: Progressing toward goal Increase Granulation Tissue to: 80% Increase Granulation  Tissue - Progress: Progressing toward goal Goals/treatment  plan/discharge plan were made with and agreed upon by patient/family: Yes Time For Goal Achievement: 7 days Wound Therapy - Potential for Goals: Good  Goals will be updated until maximal potential achieved or discharge criteria met.  Discharge criteria: when goals achieved, discharge from hospital, MD decision/surgical intervention, no progress towards goals, refusal/missing three consecutive treatments without notification or medical reason.  GP     Charges PT Wound Care Charges $Wound Debridement up to 20 cm: < or equal to 20 cm $ Wound Debridement each add'l 20 sqcm: 9 $PT PLS Gun and Tip: 1 Supply $PT Hydrotherapy Visit: 1 Visit       Thelma Comp 12/21/2020, 3:25 PM  Rolinda Roan, PT, DPT Acute Rehabilitation Services Pager: [phone] Office: [phone]

## 2020-12-05 NOTE — Progress Notes (Signed)
Patient ID: VIANCA GARDENHIRE, female   DOB: 08-May-1955, 65 y.o.   MRN: [id number]  [facility] Progress Note   Assessment/ Plan:   1.  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  She has agreed to bilateral above-knee amputations to be done at some point soon per vascular surgery.  2. ESRD:  - Continue HD per MWF schedule  - renal diet when taking PO - With her current wounds on the back/inability to sit in recliner, unable to undertake outpatient dialysis and will need LTAC per charting  3. Anemia CKD: Secondary to end-stage renal disease as well as malnutrition-inflammation complex from multiple wounds on her back/limb gangrene.  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 Nutrition: Albumin level significantly depressed secondary to multifocal sites of inflammation.  Continue ONS.  6. Hypertension: controlled, monitor with HD/UF.  7. Leukocytosis - marked leukocytosis slightly improved. Abx per primary team.  Blood cultures NGTD.  Work-up per primary team  Subjective:   RN Denyse Amass charted that she spoke to me - to clarify, I have not spoken with anyone about this patient today before my exam just now; perhaps another MD?  Last HD on 8/8 with 2.7 kg uf.  Patient came back from a bilateral AKA.  Per bedside RN, patient has been at her baseline - difficult to wake up but oriented once awake.  BP 123456 systolic per verbal report.  About to get her midodrine  Review of systems: Denies shortness of breath or chest pain  Has been NPO  Denies n/v   Objective:   BP (!) 122/47 (BP Location: Left Arm)   Pulse 79   Temp 98 F (36.7 C)   Resp 16   Ht '5\' 4"'$  (1.626 m)   Wt 89.8  kg   LMP  (LMP Unknown)   SpO2 100%   BMI 33.98 kg/m   Physical Exam:  General adult female in bed in no acute distress HEENT normocephalic atraumatic Neck supple trachea midline Lungs clear to auscultation bilaterally normal work of breathing at rest  Heart S1S2 no rub Abdomen soft nontender nondistended Extremities bilateral AKA legs wrapped; gangrenous changes to left hand  Psych normal mood and affect when awake Neuro - awakens with exam and oriented to person, place, and location Access: RUE AVG in place  Labs: BMET Recent Labs  Lab 11/29/20 0926 11/30/20 0316 12/01/20 0309 12/02/20 0437 12/02/20 1834 12/03/20 0254 12/04/20 0707 12/04/2020 0404  NA 131* 131* 133* 134* 134* 134* 133* 133*  K 4.3 5.5* 5.1 4.6 4.0 5.4* 5.7* 4.2  CL 92* 93* 95* 95* 102 95* 92* 96*  CO2 24 19* 22 26 19* '23 25 25  '$ GLUCOSE 120* 120* 99 145* 138* 154* 168* 163*  BUN 54* 36* 42* 28* 32* 44* 63* 35*  CREATININE 9.71* 6.62* 7.97* 5.87* 5.60* 7.31* 8.83* 5.25*  CALCIUM 8.0* 8.9 8.7* 8.4* 6.9* 8.5* 8.6* 8.7*  PHOS 6.4* 4.6 6.0* 4.3  --  5.7* 5.7* 3.6   CBC Recent Labs  Lab 11/29/20 0927 12/02/20 0437 12/02/20 1529 12/02/20 1834 12/02/20 2304 12/04/20 0707  WBC 25.8*   < > 54.5* 44.2* 59.5* 53.8*  NEUTROABS 22.3*  --   --  39.5*  --   --   HGB 7.0*   < > 7.5* 6.4* 7.6*  7.6* 7.8*  HCT 22.2*   < > 23.4* 21.5* 24.7*  24.1* 25.2*  MCV 75.5*   < > 75.7* 78.8* 77.7* 75.7*  PLT 682*   < > 737* 623* 770* 756*   < > = values in this interval not displayed.      Medications:     (feeding supplement) PROSource Plus  30 mL Oral BID BM   sodium chloride   Intravenous Once   sodium chloride   Intravenous Once   amiodarone  200 mg Oral Daily   aspirin EC  81 mg Oral QHS   atorvastatin  40 mg Oral QPM   Chlorhexidine Gluconate Cloth  6 each Topical Q0600   collagenase   Topical Daily   darbepoetin (ARANESP) injection - DIALYSIS  150 mcg Intravenous Q Mon-HD   doxercalciferol  1.5 mcg  Intravenous Q M,W,F-HD   [START ON 12/06/2020] enoxaparin (LOVENOX) injection  90 mg Subcutaneous Q24H   feeding supplement (NEPRO CARB STEADY)  237 mL Oral BID BM   gabapentin  300 mg Oral QHS   hydrocortisone  25 mg Rectal BID   insulin aspart  0-15 Units Subcutaneous TID WC   insulin detemir  16 Units Subcutaneous QHS   midodrine  5 mg Oral TID WC   multivitamin  1 tablet Oral QHS   ondansetron (ZOFRAN) IV  4 mg Intravenous Once   polyethylene glycol  17 g Oral Daily   senna-docusate  1 tablet Oral BID   sevelamer carbonate  2,400 mg Oral TID with meals   sodium chloride flush  3 mL Intravenous Q12H   sodium hypochlorite   Topical Daily   sodium zirconium cyclosilicate  10 g Oral Once   Claudia Desanctis, MD 12/23/2020, 2:03 PM

## 2020-12-05 NOTE — Progress Notes (Signed)
Tried to reach daughter for consent for surgery.  No answer and no voicemail to leave message.

## 2020-12-05 NOTE — Progress Notes (Signed)
PROGRESS NOTE    Christy Zhang   [id number]  DOB: 1956/04/04  DOA: 12/12/2020 PCP: Claretta Fraise, MD   Brief Narrative:  Christy Zhang is a 65 old female with history of bilateral BKA, ESRD on dialysis on Monday/Wednesday/Friday, type 2 diabetes, hypertension, peripheral vascular disease, hyperlipidemia, recently discharged from here on 11/14/2020 after prolonged hospitalization when she was admitted with altered mental status and had to be intubated for airway protection and also underwent CRRT in the setting of septic shock from pneumonia causing acute respiratory failure with hypoxia, AKI.  She presented at [facility] hospital ED because it was too painful to sit down for dialysis due to her decubitus ulcer and she also missed 2 session of dialysis.  Patient was sent to [facility] from [facility] for further management.  Nephrology,orthopedics,palliative care vascular surgery consulted.   Subjective: Evaluated prior to OR today. No complaints.     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 (HCC) Wound on b/l stumps- Leukocytosis  - s/p thrombectomy of left radial artery thrombotic occlusion- hand is significantly gangrenous - non healing wounds present on b/l stumps - vascular surgery recommending b/l AKA and patient and family in agreement of this-  - WBC rising from 25.8> 45.8 > 54- suspect multiple wounds are the cause-   - temps 100.3 on 8/6- started Vanc, Cefepime, Flagyl on 8/6- ordered blood cultures, UA and Urine culture prior to this - discussed the poor prognosis with the patient again - WBC has not improved with antibiotics - her daughter states she wants her transferred to [facility] - I called on 8/8 but they did not have available beds - underwent b/l AKA today - follow wbc count    Active Problems:   Sacral and right hip decubitus ulcers - general surgery does not  recommend debridement but has recommended hydrotherapy and santyl which is being continued   ESRD- AOCD - cont dialysis per nephrology on M/W/F via LUE fistula - she is unable to sit for dialysis and at this point we are not sure how she will receive outpt dialysis  Hyperkalemia - is not going to be receiving dialysis today- K 5.4- started [facility]  - K is higher today    Obesity, Class III, BMI 40-49.9 (morbid obesity) ([place])  Body mass index is 33.98 kg/m.    Paroxysmal atrial fibrillation (HCC) - cont amiodarone- eliquis on hold   Left renal mass - MRI abdomen attempted  x 4 - I have cancelled it  DM2 - cont Levemir and SSI Hemoglobin A1C    Component Value Date/Time   HGBA1C 6.4 (H) 10/27/2020 1848   HGBA1C 5.4 04/06/2020 1105    Patient is not coherent and not able to make decisions for herself- please speak with daughter Christy Zhang.    Time spent in minutes: 35 DVT prophylaxis: holding Code Status: partial code Family Communication:  Level of Care: Level of care: Telemetry Medical Disposition Plan:  Status is: Inpatient Remains inpatient appropriate because:IV treatments appropriate due to intensity of illness or inability to take PO  Dispo: The patient is from: Home              Anticipated d/c is to:  TBD              Patient currently is not medically stable to d/c.   Difficult to place patient No   Consultants:  Nephrology  Gen surgery Vascular surgery Procedures:   Antimicrobials:  Anti-infectives (From admission, onward)    Start     Dose/Rate Route Frequency Ordered Stop   12/04/2020 0600  vancomycin (VANCOCIN) IVPB 1000 mg/200 mL premix        1,000 mg 200 mL/hr over 60 Minutes Intravenous To Surgery 12/04/20 1557 12/18/2020 1038   12/04/20 1200  vancomycin (VANCOCIN) IVPB 1000 mg/200 mL premix        1,000 mg 200 mL/hr over 60 Minutes Intravenous Every M-W-F (Hemodialysis) 12/02/20 1734     12/03/20 1800  ceFEPIme (MAXIPIME) 1 g in sodium chloride 0.9  % 100 mL IVPB        1 g 200 mL/hr over 30 Minutes Intravenous Every 24 hours 12/02/20 1734     12/02/20 1830  vancomycin (VANCOREADY) IVPB 1750 mg/350 mL        1,750 mg 175 mL/hr over 120 Minutes Intravenous  Once 12/02/20 1734 12/02/20 2156   12/02/20 1800  ceFEPIme (MAXIPIME) 2 g in sodium chloride 0.9 % 100 mL IVPB        2 g 200 mL/hr over 30 Minutes Intravenous  Once 12/02/20 1707 12/02/20 2003   12/02/20 1800  metroNIDAZOLE (FLAGYL) IVPB 500 mg        500 mg 100 mL/hr over 60 Minutes Intravenous Every 8 hours 12/02/20 1707     12/02/20 1800  vancomycin (VANCOCIN) IVPB 1000 mg/200 mL premix  Status:  Discontinued        1,000 mg 200 mL/hr over 60 Minutes Intravenous  Once 12/02/20 1707 12/02/20 1734        Objective: Vitals:   12/04/2020 1250 12/22/2020 1300 12/09/2020 1305 12/23/2020 1344  BP: (!) 99/49 (!) 115/51 (!) 106/52 (!) 122/47  Pulse:    79  Resp: '13 13 14 16  '$ Temp:      TempSrc:      SpO2: 100%  100% 100%  Weight:      Height:        Intake/Output Summary (Last 24 hours) at 12/26/2020 1649 Last data filed at 12/16/2020 1500 Gross per 24 hour  Intake 1538.38 ml  Output 100 ml  Net 1438.38 ml    Filed Weights   12/26/2020 0931  Weight: 89.8 kg    Examination: General exam: Appears comfortable  HEENT: PERRLA, oral mucosa moist, no sclera icterus or thrush Respiratory system: Clear to auscultation. Respiratory effort normal. Cardiovascular system: S1 & S2 heard, regular rate and rhythm Gastrointestinal system: Abdomen soft, non-tender, nondistended. Normal bowel sounds   Central nervous system: Alert and oriented. No focal neurological deficits. Extremities: No cyanosis, clubbing or edema- b/l BKA present- Skin: numerous foul smelling wound on stumps and back     Psychiatry:  Mood & affect appropriate.      Data Reviewed: I have personally reviewed following labs and imaging studies  CBC: Recent Labs  Lab 11/29/20 0927 12/02/20 0437 12/02/20 1529  12/02/20 1834 12/02/20 2304 12/04/20 0707  WBC 25.8* 45.8* 54.5* 44.2* 59.5* 53.8*  NEUTROABS 22.3*  --   --  39.5*  --   --   HGB 7.0* 7.4* 7.5* 6.4* 7.6*  7.6* 7.8*  HCT 22.2* 24.8* 23.4* 21.5* 24.7*  24.1* 25.2*  MCV 75.5* 77.0* 75.7* 78.8* 77.7* 75.7*  PLT 682* 720* 737* 623* 770* 756*    Basic Metabolic Panel: Recent Labs  Lab 12/01/20 0309 12/02/20 0437 12/02/20 1834 12/03/20 0254 12/04/20 0707 12/04/2020 0404  NA 133* 134* 134* 134* 133* 133*  K 5.1 4.6 4.0 5.4* 5.7* 4.2  CL 95* 95* 102 95* 92* 96*  CO2 22 26 19* '23 25 25  '$ GLUCOSE 99 145* 138* 154* 168* 163*  BUN 42* 28* 32* 44* 63* 35*  CREATININE 7.97* 5.87* 5.60* 7.31* 8.83* 5.25*  CALCIUM 8.7* 8.4* 6.9* 8.5* 8.6* 8.7*  PHOS 6.0* 4.3  --  5.7* 5.7* 3.6    GFR: Estimated Creatinine Clearance: 11.9 mL/min (A) (by C-G formula based on SCr of 5.25 mg/dL (H)). Liver Function Tests: Recent Labs  Lab 12/02/20 0437 12/02/20 1834 12/03/20 0254 12/04/20 0707 12/13/2020 0404  AST  --  24  --   --   --   ALT  --  10  --   --   --   ALKPHOS  --  93  --   --   --   BILITOT  --  0.6  --   --   --   PROT  --  5.5*  --   --   --   ALBUMIN 1.8* 1.4* 1.7* 1.6* 1.9*    No results for input(s): LIPASE, AMYLASE in the last 168 hours.  No results for input(s): AMMONIA in the last 168 hours. Coagulation Profile: Recent Labs  Lab 12/02/20 1834  INR 1.5*    Cardiac Enzymes: No results for input(s): CKTOTAL, CKMB, CKMBINDEX, TROPONINI in the last 168 hours. BNP (last 3 results) No results for input(s): PROBNP in the last 8760 hours. HbA1C: No results for input(s): HGBA1C in the last 72 hours. CBG: Recent Labs  Lab 12/04/20 2257 12/18/2020 0657 12/01/2020 0916 12/24/2020 1235 12/04/2020 1345  GLUCAP 161* 157* 155* 144* 168*    Lipid Profile: No results for input(s): CHOL, HDL, LDLCALC, TRIG, CHOLHDL, LDLDIRECT in the last 72 hours. Thyroid Function Tests: No results for input(s): TSH, T4TOTAL, FREET4, T3FREE,  THYROIDAB in the last 72 hours. Anemia Panel: No results for input(s): VITAMINB12, FOLATE, FERRITIN, TIBC, IRON, RETICCTPCT in the last 72 hours. Urine analysis:    Component Value Date/Time   COLORURINE AMBER (A) 10/28/2020 1322   APPEARANCEUR TURBID (A) 10/28/2020 1322   LABSPEC 1.014 10/28/2020 1322   PHURINE 5.0 10/28/2020 1322   GLUCOSEU NEGATIVE 10/28/2020 1322   HGBUR MODERATE (A) 10/28/2020 1322   BILIRUBINUR NEGATIVE 10/28/2020 1322   KETONESUR NEGATIVE 10/28/2020 1322   PROTEINUR 100 (A) 10/28/2020 1322   NITRITE NEGATIVE 10/28/2020 1322   LEUKOCYTESUR LARGE (A) 10/28/2020 1322   Sepsis Labs: '@LABRCNTIP'$ (procalcitonin:4,lacticidven:4) ) Recent Results (from the past 240 hour(s))  Resp Panel by RT-PCR (Flu A&B, Covid) Nasopharyngeal Swab     Status: None   Collection Time: 12/14/2020 10:26 AM   Specimen: Nasopharyngeal Swab; Nasopharyngeal(NP) swabs in vial transport medium  Result Value Ref Range Status   SARS Coronavirus 2 by RT PCR NEGATIVE NEGATIVE Final    Comment: (NOTE) SARS-CoV-2 target nucleic acids are NOT DETECTED.  The SARS-CoV-2 RNA is generally detectable in upper respiratory specimens during the acute phase of infection. The lowest concentration of SARS-CoV-2 viral copies this assay can detect is 138 copies/mL. A negative result does not preclude SARS-Cov-2 infection and should not be used as the sole basis for treatment or other patient management decisions. A negative result may occur with  improper specimen collection/handling, submission of specimen other than nasopharyngeal swab, presence of viral mutation(s) within the areas targeted by this assay, and inadequate number of viral copies(<138 copies/mL). A negative result must be combined with clinical observations, patient history, and epidemiological information. The  expected result is Negative.  Fact Sheet for Patients:  EntrepreneurPulse.com.au  Fact Sheet for Healthcare  Providers:  IncredibleEmployment.be  This test is no t yet approved or cleared by the [place] FDA and  has been authorized for detection and/or diagnosis of SARS-CoV-2 by FDA under an Emergency Use Authorization (EUA). This EUA will remain  in effect (meaning this test can be used) for the duration of the COVID-19 declaration under Section 564(b)(1) of the Act, 21 U.S.C.section 360bbb-3(b)(1), unless the authorization is terminated  or revoked sooner.       Influenza A by PCR NEGATIVE NEGATIVE Final   Influenza B by PCR NEGATIVE NEGATIVE Final    Comment: (NOTE) The Xpert Xpress SARS-CoV-2/FLU/RSV plus assay is intended as an aid in the diagnosis of influenza from Nasopharyngeal swab specimens and should not be used as a sole basis for treatment. Nasal washings and aspirates are unacceptable for Xpert Xpress SARS-CoV-2/FLU/RSV testing.  Fact Sheet for Patients: EntrepreneurPulse.com.au  Fact Sheet for Healthcare Providers: IncredibleEmployment.be  This test is not yet approved or cleared by the [place] FDA and has been authorized for detection and/or diagnosis of SARS-CoV-2 by FDA under an Emergency Use Authorization (EUA). This EUA will remain in effect (meaning this test can be used) for the duration of the COVID-19 declaration under Section 564(b)(1) of the Act, 21 U.S.C. section 360bbb-3(b)(1), unless the authorization is terminated or revoked.  Performed at [facility], [address], [place], [place] [address]   Blood culture (routine x 2)     Status: None   Collection Time: 12/09/2020 10:45 AM   Specimen: BLOOD  Result Value Ref Range Status   Specimen Description BLOOD LEFT ARM  Final   Special Requests   Final    BOTTLES DRAWN AEROBIC AND ANAEROBIC Blood Culture adequate volume   Culture   Final    NO GROWTH 5 DAYS Performed at [facility], [address], [place], [place] [address]    Report  Status 12/02/2020 FINAL  Final  Blood culture (routine x 2)     Status: None   Collection Time: 12/22/2020 12:42 PM   Specimen: Left Antecubital; Blood  Result Value Ref Range Status   Specimen Description LEFT ANTECUBITAL  Final   Special Requests   Final    Blood Culture adequate volume BOTTLES DRAWN AEROBIC AND ANAEROBIC   Culture   Final    NO GROWTH 5 DAYS Performed at [facility], [address], [place], [place] [address]    Report Status 12/02/2020 FINAL  Final  MRSA Next Gen by PCR, Nasal     Status: Abnormal   Collection Time: 11/28/20  1:41 AM   Specimen: Nasal Mucosa; Nasal Swab  Result Value Ref Range Status   MRSA by PCR Next Gen DETECTED (A) NOT DETECTED Final    Comment: RESULT CALLED TO, READ BACK BY AND VERIFIED WITH: Starr Sinclair RN, AT [phone] 11/28/20 D. VANHOOK (NOTE) The GeneXpert MRSA Assay (FDA approved for NASAL specimens only), is one component of a comprehensive MRSA colonization surveillance program. It is not intended to diagnose MRSA infection nor to guide or monitor treatment for MRSA infections. Test performance is not FDA approved in patients less than 65 years old. Performed at [facility], [place] [address], [place], [place] [address]   Culture, blood (x 2)     Status: None (Preliminary result)   Collection Time: 12/02/20  6:34 PM   Specimen: BLOOD  Result Value Ref Range Status  Specimen Description BLOOD LEFT ANTECUBITAL  Final   Special Requests AEROBIC BOTTLE ONLY Blood Culture adequate volume  Final   Culture   Final    NO GROWTH 3 DAYS Performed at [facility], [address] [place], [place] [address]    Report Status PENDING  Incomplete  Culture, blood (x 2)     Status: None (Preliminary result)   Collection Time: 12/02/20  6:34 PM   Specimen: BLOOD  Result Value Ref Range Status   Specimen Description BLOOD LEFT ANTECUBITAL  Final   Special Requests IN PEDIATRIC BOTTLE Blood Culture adequate volume  Final   Culture    Final    NO GROWTH 3 DAYS Performed at [facility], [place] [address] [place], [place] [address]    Report Status PENDING  Incomplete  Surgical pcr screen     Status: Abnormal   Collection Time: 12/04/2020  7:57 AM   Specimen: Nasal Mucosa; Nasal Swab  Result Value Ref Range Status   MRSA, PCR POSITIVE (A) NEGATIVE Final    Comment: RESULT CALLED TO, READ BACK BY AND VERIFIED WITH: RN S CLOTER [id number] AT 31 AM BY CM    Staphylococcus aureus POSITIVE (A) NEGATIVE Final    Comment: (NOTE) The Xpert SA Assay (FDA approved for NASAL specimens in patients 26 years of age and older), is one component of a comprehensive surveillance program. It is not intended to diagnose infection nor to guide or monitor treatment. Performed at [facility], [place] [address] [place], [place] [address]          Radiology Studies: No results found.    Scheduled Meds:  (feeding supplement) PROSource Plus  30 mL Oral BID BM   sodium chloride   Intravenous Once   sodium chloride   Intravenous Once   amiodarone  200 mg Oral Daily   aspirin EC  81 mg Oral QHS   atorvastatin  40 mg Oral QPM   Chlorhexidine Gluconate Cloth  6 each Topical Q0600   collagenase   Topical Daily   darbepoetin (ARANESP) injection - DIALYSIS  150 mcg Intravenous Q Mon-HD   doxercalciferol  1.5 mcg Intravenous Q M,W,F-HD   [START ON 12/06/2020] enoxaparin (LOVENOX) injection  90 mg Subcutaneous Q24H   feeding supplement (NEPRO CARB STEADY)  237 mL Oral BID BM   gabapentin  300 mg Oral QHS   hydrocortisone  25 mg Rectal BID   insulin aspart  0-15 Units Subcutaneous TID WC   insulin detemir  16 Units Subcutaneous QHS   midodrine  5 mg Oral TID WC   multivitamin  1 tablet Oral QHS   ondansetron (ZOFRAN) IV  4 mg Intravenous Once   polyethylene glycol  17 g Oral Daily   senna-docusate  1 tablet Oral BID   sevelamer carbonate  2,400 mg Oral TID with meals   sodium chloride flush  3 mL Intravenous Q12H   sodium  hypochlorite   Topical Daily   Continuous Infusions:  sodium chloride     sodium chloride     sodium chloride 10 mL/hr at 12/04/20 0300   ceFEPime (MAXIPIME) IV 1 g (12/04/20 1708)   metronidazole 500 mg (12/09/2020 1402)   vancomycin 1,000 mg (12/04/20 1336)     LOS: 8 days      Debbe Odea, MD [facility] Pager: [email or web address] 12/27/2020, 4:49 PM

## 2020-12-05 NOTE — Plan of Care (Signed)
  Problem: Activity: Goal: Risk for activity intolerance will decrease Outcome: Not Progressing   Problem: Nutrition: Goal: Adequate nutrition will be maintained Outcome: Not Progressing   

## 2020-12-05 NOTE — Op Note (Signed)
Procedure: Bilateral above-knee amputation  Preoperative diagnosis: Nonhealing wounds bilateral BKA  Postoperative diagnosis: Same  Anesthesia: General  Assistant: Leontine Locket, PA-C for assistance with exposure and expediting procedure  Operative details: After obtaining informed consent, the patient was taken the operating.  The patient was placed in supine position operating table.  After induction general anesthesia and endotracheal intubation both lower extremities were prepped and draped in sterile fashion.  Next a circumferential incision was made on the right leg just above the knee.  The incision was carried down through subcutaneous tissues down to the level of the muscle tissues and the fascia was incised.  Saphenous vein was then divided ligated via tween silk ties.  Manger of the soft tissues were taken down all the way to the level of the femur.  The superficial femoral artery and vein were clamped proximally distally and divided and then suture ligated proximally.  After the rest of the soft tissues had been taken down with cautery dissection the periosteum was placed in the femur.  Femur was then divided about 5 cm above the skin edge.  The wound was thoroughly irrigated normal saline solution.  Hemostasis was obtained.  Fascial edges were reapproximated using interrupted 2-0 Vicryl sutures.  Subcutaneous tissues were reapproximated using a running 3-0 Vicryl suture.  Skin was closed with staples.  Attention was then turned to the left leg.  A similar incision was made.  Incision was carried through the subcutaneous tissues down the level of fascia.  The saphenous vein was ligated divided tween silk ties.  The fascia was incised as well as the muscle tissues.  Superficial femoral artery and vein were identified clamped proximally distally and divided.  They were suture ligated proximally.  Periosteum was then raised on the femur about 5 cm.  The femur was divided with a saw.  The wound  was thoroughly irrigated normal saline solution.  The fascial edges were reapproximated using interrupted 2-0 Vicryl sutures.  The subcutaneous tissues were approximated using a running 3-0 Vicryl suture.  The skin was closed with staples.  Patient tolerated procedure well and there were no complications.  Instrument sponge and needle counts correct in the case.  Dry sterile dressing was applied.  Patient tolerated procedure well and was taken to recovery in stable condition.  Ruta Hinds, MD [facility] Office: [phone]

## 2020-12-05 NOTE — Telephone Encounter (Signed)
Moved 8/10 appt due to provider being out of office. Called, not able to leave msg. Will attempt to call again

## 2020-12-05 NOTE — Progress Notes (Signed)
Spoke with Dr. Royce Macadamia regarding bps patient is able to return to room and patient did receive one unit of blood in OR 2nd unit not given at this time floor nurse made aware

## 2020-12-05 NOTE — Transfer of Care (Signed)
Immediate Anesthesia Transfer of Care Note  Patient: Christy Zhang  Procedure(s) Performed: BILATERAL ABOVE KNEE AMPUTATIONS (Bilateral: Knee)  Patient Location: PACU  Anesthesia Type:General  Level of Consciousness: awake and drowsy  Airway & Oxygen Therapy: Patient Spontanous Breathing and Patient connected to face mask oxygen  Post-op Assessment: Report given to RN and Post -op Vital signs reviewed and stable  Post vital signs: Reviewed and stable  Last Vitals:  Vitals Value Taken Time  BP 110/48 12/22/2020 1235  Temp 36.7 C 11/28/2020 1235  Pulse 83 12/14/2020 1235  Resp 13 11/30/2020 1238  SpO2 98 % 11/28/2020 1235  Vitals shown include unvalidated device data.  Last Pain:  Vitals:   12/24/2020 0935  TempSrc:   PainSc: 0-No pain      Patients Stated Pain Goal: 0 ([id number])  Complications: No notable events documented.

## 2020-12-05 NOTE — Progress Notes (Signed)
Spoke to daughter regarding surgery and consent.  Per daughter wants to think about it.

## 2020-12-05 NOTE — Plan of Care (Signed)
  Problem: Education: Goal: Knowledge of General Education information will improve Description: Including pain rating scale, medication(s)/side effects and non-pharmacologic comfort measures Outcome: Progressing   Problem: Health Behavior/Discharge Planning: Goal: Ability to manage health-related needs will improve Outcome: Progressing   Problem: Clinical Measurements: Goal: Will remain free from infection Outcome: Progressing   Problem: Activity: Goal: Risk for activity intolerance will decrease Outcome: Progressing   Problem: Nutrition: Goal: Adequate nutrition will be maintained Outcome: Progressing   Problem: Skin Integrity: Goal: Risk for impaired skin integrity will decrease Outcome: Progressing   

## 2020-12-05 NOTE — Plan of Care (Signed)
  Problem: Education: Goal: Knowledge of General Education information will improve Description: Including pain rating scale, medication(s)/side effects and non-pharmacologic comfort measures 12/02/2020 0518 by Jacqualine Code, RN Outcome: Progressing 12/12/2020 0401 by Jacqualine Code, RN Outcome: Progressing   Problem: Health Behavior/Discharge Planning: Goal: Ability to manage health-related needs will improve Outcome: Progressing   Problem: Clinical Measurements: Goal: Will remain free from infection Outcome: Progressing Goal: Diagnostic test results will improve Outcome: Progressing Goal: Cardiovascular complication will be avoided Outcome: Progressing   Problem: Activity: Goal: Risk for activity intolerance will decrease Outcome: Progressing   Problem: Nutrition: Goal: Adequate nutrition will be maintained 12/08/2020 0518 by Jacqualine Code, RN Outcome: Progressing 12/03/2020 0401 by Jacqualine Code, RN Outcome: Progressing   Problem: Skin Integrity: Goal: Risk for impaired skin integrity will decrease Outcome: Progressing

## 2020-12-06 ENCOUNTER — Encounter (HOSPITAL_COMMUNITY): Payer: Self-pay | Admitting: Vascular Surgery

## 2020-12-06 ENCOUNTER — Inpatient Hospital Stay: Payer: Medicare Other

## 2020-12-06 ENCOUNTER — Inpatient Hospital Stay: Payer: Medicare Other | Admitting: Physician Assistant

## 2020-12-06 DIAGNOSIS — I96 Gangrene, not elsewhere classified: Secondary | ICD-10-CM | POA: Diagnosis not present

## 2020-12-06 DIAGNOSIS — I480 Paroxysmal atrial fibrillation: Secondary | ICD-10-CM | POA: Diagnosis not present

## 2020-12-06 DIAGNOSIS — N186 End stage renal disease: Secondary | ICD-10-CM | POA: Diagnosis not present

## 2020-12-06 DIAGNOSIS — Z992 Dependence on renal dialysis: Secondary | ICD-10-CM | POA: Diagnosis not present

## 2020-12-06 LAB — GLUCOSE, CAPILLARY
Glucose-Capillary: 111 mg/dL — ABNORMAL HIGH (ref 70–99)
Glucose-Capillary: 108 mg/dL — ABNORMAL HIGH (ref 70–99)
Glucose-Capillary: 122 mg/dL — ABNORMAL HIGH (ref 70–99)
Glucose-Capillary: 180 mg/dL — ABNORMAL HIGH (ref 70–99)

## 2020-12-06 LAB — BPAM RBC
Blood Product Expiration Date: 202208272359
Blood Product Expiration Date: 202208282359
ISSUE DATE / TIME: 202208090944
ISSUE DATE / TIME: 202208090944
Unit Type and Rh: 7300
Unit Type and Rh: 7300

## 2020-12-06 LAB — CBC
HCT: 25.5 % — ABNORMAL LOW (ref 36.0–46.0)
Hemoglobin: 8.1 g/dL — ABNORMAL LOW (ref 12.0–15.0)
MCH: 24.3 pg — ABNORMAL LOW (ref 26.0–34.0)
MCHC: 31.8 g/dL (ref 30.0–36.0)
MCV: 76.3 fL — ABNORMAL LOW (ref 80.0–100.0)
Platelets: 734 10*3/uL — ABNORMAL HIGH (ref 150–400)
RBC: 3.34 MIL/uL — ABNORMAL LOW (ref 3.87–5.11)
RDW: 20.5 % — ABNORMAL HIGH (ref 11.5–15.5)
WBC: 44.9 10*3/uL — ABNORMAL HIGH (ref 4.0–10.5)
nRBC: 0.1 % (ref 0.0–0.2)

## 2020-12-06 LAB — TYPE AND SCREEN
ABO/RH(D): B POS
Antibody Screen: NEGATIVE
Unit division: 0
Unit division: 0

## 2020-12-06 LAB — BASIC METABOLIC PANEL
Anion gap: 12 (ref 5–15)
BUN: 50 mg/dL — ABNORMAL HIGH (ref 8–23)
CO2: 22 mmol/L (ref 22–32)
Calcium: 8.7 mg/dL — ABNORMAL LOW (ref 8.9–10.3)
Chloride: 99 mmol/L (ref 98–111)
Creatinine, Ser: 6.62 mg/dL — ABNORMAL HIGH (ref 0.44–1.00)
GFR, Estimated: 7 mL/min — ABNORMAL LOW (ref >60)
Glucose, Bld: 111 mg/dL — ABNORMAL HIGH (ref 70–99)
Potassium: 4.9 mmol/L (ref 3.5–5.1)
Sodium: 133 mmol/L — ABNORMAL LOW (ref 135–145)

## 2020-12-06 LAB — VANCOMYCIN, RANDOM: Vancomycin Rm: 42

## 2020-12-06 MED ORDER — ENOXAPARIN SODIUM 80 MG/0.8ML IJ SOSY
80.0000 mg | PREFILLED_SYRINGE | INTRAMUSCULAR | Status: DC
  Administered 2020-12-06: 80 mg via SUBCUTANEOUS
  Filled 2020-12-06 (×2): qty 0.8

## 2020-12-06 MED ORDER — VANCOMYCIN HCL IN DEXTROSE 1-5 GM/200ML-% IV SOLN: 1000.0000 mg | INTRAVENOUS | Status: DC

## 2020-12-06 MED ORDER — MIDODRINE HCL 5 MG PO TABS
ORAL_TABLET | ORAL | Status: AC
  Filled 2020-12-06: qty 1

## 2020-12-06 MED ORDER — DOXERCALCIFEROL 4 MCG/2ML IV SOLN
INTRAVENOUS | Status: AC
  Administered 2020-12-06: 1.5 ug via INTRAVENOUS
  Filled 2020-12-06: qty 2

## 2020-12-06 NOTE — Progress Notes (Signed)
Patient ID: Christy Zhang, female   DOB: 1956-03-04, 65 y.o.   MRN: [id number]  PROGRESS NOTE    FLORENA GURKIN  [id number] DOB: 05-01-1955 DOA: 12/09/2020 PCP: Claretta Fraise, MD   Brief Narrative:   17 old female with history of bilateral BKA, ESRD on dialysis,  type 2 diabetes, hypertension, peripheral vascular disease, hyperlipidemia, recently discharged from here on 11/14/2020 after prolonged hospitalization when she was admitted with altered mental status and had to be intubated for airway protection and also underwent CRRT in the setting of septic shock from pneumonia causing acute respiratory failure with hypoxia, AKI.  She presented at [facility] hospital ED because it was too painful to sit down for dialysis due to her decubitus ulcer and she also missed 2 session of dialysis.  Patient was sent to [facility] from [facility] for further management.  Nephrology,orthopedics,palliative care vascular surgery consulted.  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 -Patient presenting with nonhealing wounds on bilateral BKA stumps: Vascular surgery following, status post bilateral AKA on 12/16/2020. -Leukocytosis slightly improving  Sacral and right hip decubitus ulcers: Present on admission -Currently undergoing hydrotherapy as per general surgery recommendations (did not recommend debridement).  Continue Santyl  End-stage renal disease Anemia of chronic disease Hyperkalemia: Resolved -Continue dialysis as per nephrology schedule. -she is unable to sit for dialysis and at this point we are not sure how she will receive outpt dialysis.  Will need LTAC as per nephrology -Hemoglobin currently stable.  Thrombocytosis -Possibly reactive.   Monitor  Mild hyponatremia -Nephrology managing by dialysis  Obesity-outpatient follow-up  Paroxysmal A. Fib -Continue amiodarone.  Eliquis on hold  Left renal mass -MRI was attempted x4 but subsequently canceled  Diabetes mellitus type 2 -Continue Levemir and CBGs with SSI.  A1c 6.4 on 10/27/2020  Generalized deconditioning Goals of care -Overall prognosis is very poor.  Consult palliative care for goals of care discussion.    DVT prophylaxis: Lovenox Code Status: Full Family Communication: None at bedside Disposition Plan: Status is: Inpatient  Remains inpatient appropriate because:Inpatient level of care appropriate due to severity of illness  Dispo: The patient is from: Home              Anticipated d/c is to: Home              Patient currently is not medically stable to d/c.   Difficult to place patient No    Consultants: Nephrology/vascular surgery/general surgery  Procedures: Bilateral AKA on 11/29/2020  Antimicrobials:  Anti-infectives (From admission, onward)    Start     Dose/Rate Route Frequency Ordered Stop   12/08/20 1200  vancomycin (VANCOCIN) IVPB 1000 mg/200 mL premix        1,000 mg 200 mL/hr over 60 Minutes Intravenous Every M-W-F (Hemodialysis) 12/06/20 1138     12/20/2020 0600  vancomycin (VANCOCIN) IVPB 1000 mg/200 mL premix        1,000 mg 200 mL/hr over 60 Minutes Intravenous To Surgery 12/04/20 1557 12/20/2020 1038   12/04/20 1200  vancomycin (VANCOCIN) IVPB 1000 mg/200 mL premix  Status:  Discontinued        1,000 mg 200 mL/hr over 60 Minutes Intravenous Every M-W-F (Hemodialysis) 12/02/20 1734 12/06/20 1138   12/03/20 1800  ceFEPIme (MAXIPIME) 1 g in sodium chloride 0.9 %  100 mL IVPB        1 g 200 mL/hr over 30 Minutes Intravenous Every 24 hours 12/02/20 1734     12/02/20 1830  vancomycin (VANCOREADY) IVPB 1750 mg/350 mL        1,750 mg 175 mL/hr over 120 Minutes Intravenous  Once 12/02/20 1734 12/02/20 2156   12/02/20 1800  ceFEPIme  (MAXIPIME) 2 g in sodium chloride 0.9 % 100 mL IVPB        2 g 200 mL/hr over 30 Minutes Intravenous  Once 12/02/20 1707 12/02/20 2003   12/02/20 1800  metroNIDAZOLE (FLAGYL) IVPB 500 mg        500 mg 100 mL/hr over 60 Minutes Intravenous Every 8 hours 12/02/20 1707     12/02/20 1800  vancomycin (VANCOCIN) IVPB 1000 mg/200 mL premix  Status:  Discontinued        1,000 mg 200 mL/hr over 60 Minutes Intravenous  Once 12/02/20 1707 12/02/20 1734        Subjective: Patient seen and examined at bedside.  Sleepy, wakes up slightly, hardly answers any questions.  No overnight fever, vomiting, seizures reported.  Objective: Vitals:   12/06/20 1130 12/06/20 1200 12/06/20 1230 12/06/20 1251  BP: (!) 90/44 (!) 90/49 (!) 90/50 (!) 111/52  Pulse: 79 78 78 79  Resp: '18 16 16 16  '$ Temp:    98.7 F (37.1 C)  TempSrc:    Oral  SpO2: 100% 100% 100% 100%  Weight:    81 kg  Height:        Intake/Output Summary (Last 24 hours) at 12/06/2020 1343 Last data filed at 12/06/2020 1251 Gross per 24 hour  Intake 870.27 ml  Output 1500 ml  Net -629.73 ml   Filed Weights   12/10/2020 0931 12/06/20 0943 12/06/20 1251  Weight: 89.8 kg 82.3 kg 81 kg    Examination:  General exam: Looks chronically ill and deconditioned. Respiratory system: Bilateral decreased breath sounds at bases with scattered crackles Cardiovascular system: S1 & S2 heard, Rate controlled Gastrointestinal system: Abdomen is nondistended, soft and nontender. Normal bowel sounds heard. Extremities: No cyanosis, clubbing; bilateral AKA with dressing present. Central nervous system:Sleepy, wakes up slightly, hardly answers any questions.  No focal neurological deficits. Moving extremities Skin: No obvious ecchymosis/other lesions Psychiatry: Could not be assessed because of mental status  Data Reviewed: I have personally reviewed following labs and imaging studies  CBC: Recent Labs  Lab 12/02/20 1529 12/02/20 1834 12/02/20 2304  12/04/20 0707 12/06/20 0635  WBC 54.5* 44.2* 59.5* 53.8* 44.9*  NEUTROABS  --  39.5*  --   --   --   HGB 7.5* 6.4* 7.6*  7.6* 7.8* 8.1*  HCT 23.4* 21.5* 24.7*  24.1* 25.2* 25.5*  MCV 75.7* 78.8* 77.7* 75.7* 76.3*  PLT 737* 623* 770* 756* [id number]*   Basic Metabolic Panel: Recent Labs  Lab 12/01/20 0309 12/02/20 0437 12/02/20 1834 12/03/20 0254 12/04/20 0707 12/16/2020 0404 12/06/20 0635  NA 133* 134* 134* 134* 133* 133* 133*  K 5.1 4.6 4.0 5.4* 5.7* 4.2 4.9  CL 95* 95* 102 95* 92* 96* 99  CO2 22 26 19* '23 25 25 22  '$ GLUCOSE 99 145* 138* 154* 168* 163* 111*  BUN 42* 28* 32* 44* 63* 35* 50*  CREATININE 7.97* 5.87* 5.60* 7.31* 8.83* 5.25* 6.62*  CALCIUM 8.7* 8.4* 6.9* 8.5* 8.6* 8.7* 8.7*  PHOS 6.0* 4.3  --  5.7* 5.7* 3.6  --    GFR: Estimated Creatinine Clearance: 9 mL/min (A) (  by C-G formula based on SCr of 6.62 mg/dL (H)). Liver Function Tests: Recent Labs  Lab 12/02/20 0437 12/02/20 1834 12/03/20 0254 12/04/20 0707 12/12/2020 0404  AST  --  24  --   --   --   ALT  --  10  --   --   --   ALKPHOS  --  93  --   --   --   BILITOT  --  0.6  --   --   --   PROT  --  5.5*  --   --   --   ALBUMIN 1.8* 1.4* 1.7* 1.6* 1.9*   No results for input(s): LIPASE, AMYLASE in the last 168 hours. No results for input(s): AMMONIA in the last 168 hours. Coagulation Profile: Recent Labs  Lab 12/02/20 1834  INR 1.5*   Cardiac Enzymes: No results for input(s): CKTOTAL, CKMB, CKMBINDEX, TROPONINI in the last 168 hours. BNP (last 3 results) No results for input(s): PROBNP in the last 8760 hours. HbA1C: No results for input(s): HGBA1C in the last 72 hours. CBG: Recent Labs  Lab 12/25/2020 1235 11/29/2020 1345 12/10/2020 1750 12/10/2020 2046 12/06/20 0641  GLUCAP 144* 168* 156* 133* 111*   Lipid Profile: No results for input(s): CHOL, HDL, LDLCALC, TRIG, CHOLHDL, LDLDIRECT in the last 72 hours. Thyroid Function Tests: No results for input(s): TSH, T4TOTAL, FREET4, T3FREE, THYROIDAB in  the last 72 hours. Anemia Panel: No results for input(s): VITAMINB12, FOLATE, FERRITIN, TIBC, IRON, RETICCTPCT in the last 72 hours. Sepsis Labs: Recent Labs  Lab 12/02/20 1834 12/02/20 2304  LATICACIDVEN 1.6 1.4    Recent Results (from the past 240 hour(s))  Resp Panel by RT-PCR (Flu A&B, Covid) Nasopharyngeal Swab     Status: None   Collection Time: 12/18/2020 10:26 AM   Specimen: Nasopharyngeal Swab; Nasopharyngeal(NP) swabs in vial transport medium  Result Value Ref Range Status   SARS Coronavirus 2 by RT PCR NEGATIVE NEGATIVE Final    Comment: (NOTE) SARS-CoV-2 target nucleic acids are NOT DETECTED.  The SARS-CoV-2 RNA is generally detectable in upper respiratory specimens during the acute phase of infection. The lowest concentration of SARS-CoV-2 viral copies this assay can detect is 138 copies/mL. A negative result does not preclude SARS-Cov-2 infection and should not be used as the sole basis for treatment or other patient management decisions. A negative result may occur with  improper specimen collection/handling, submission of specimen other than nasopharyngeal swab, presence of viral mutation(s) within the areas targeted by this assay, and inadequate number of viral copies(<138 copies/mL). A negative result must be combined with clinical observations, patient history, and epidemiological information. The expected result is Negative.  Fact Sheet for Patients:  EntrepreneurPulse.com.au  Fact Sheet for Healthcare Providers:  IncredibleEmployment.be  This test is no t yet approved or cleared by the [place] FDA and  has been authorized for detection and/or diagnosis of SARS-CoV-2 by FDA under an Emergency Use Authorization (EUA). This EUA will remain  in effect (meaning this test can be used) for the duration of the COVID-19 declaration under Section 564(b)(1) of the Act, 21 U.S.C.section 360bbb-3(b)(1), unless the  authorization is terminated  or revoked sooner.       Influenza A by PCR NEGATIVE NEGATIVE Final   Influenza B by PCR NEGATIVE NEGATIVE Final    Comment: (NOTE) The Xpert Xpress SARS-CoV-2/FLU/RSV plus assay is intended as an aid in the diagnosis of influenza from Nasopharyngeal swab specimens and should not be used as a sole  basis for treatment. Nasal washings and aspirates are unacceptable for Xpert Xpress SARS-CoV-2/FLU/RSV testing.  Fact Sheet for Patients: EntrepreneurPulse.com.au  Fact Sheet for Healthcare Providers: IncredibleEmployment.be  This test is not yet approved or cleared by the [place] FDA and has been authorized for detection and/or diagnosis of SARS-CoV-2 by FDA under an Emergency Use Authorization (EUA). This EUA will remain in effect (meaning this test can be used) for the duration of the COVID-19 declaration under Section 564(b)(1) of the Act, 21 U.S.C. section 360bbb-3(b)(1), unless the authorization is terminated or revoked.  Performed at [facility], [address] [place], [place] [address]   Blood culture (routine x 2)     Status: None   Collection Time: 12/16/2020 10:45 AM   Specimen: BLOOD  Result Value Ref Range Status   Specimen Description BLOOD LEFT ARM  Final   Special Requests   Final    BOTTLES DRAWN AEROBIC AND ANAEROBIC Blood Culture adequate volume   Culture   Final    NO GROWTH 5 DAYS Performed at [facility], [address], [place], [place] [address]    Report Status 12/02/2020 FINAL  Final  Blood culture (routine x 2)     Status: None   Collection Time: 12/12/2020 12:42 PM   Specimen: Left Antecubital; Blood  Result Value Ref Range Status   Specimen Description LEFT ANTECUBITAL  Final   Special Requests   Final    Blood Culture adequate volume BOTTLES DRAWN AEROBIC AND ANAEROBIC   Culture   Final    NO GROWTH 5 DAYS Performed at [facility], [address], [place], [place] [address]     Report Status 12/02/2020 FINAL  Final  MRSA Next Gen by PCR, Nasal     Status: Abnormal   Collection Time: 11/28/20  1:41 AM   Specimen: Nasal Mucosa; Nasal Swab  Result Value Ref Range Status   MRSA by PCR Next Gen DETECTED (A) NOT DETECTED Final    Comment: RESULT CALLED TO, READ BACK BY AND VERIFIED WITH: Starr Sinclair RN, AT [phone] 11/28/20 D. VANHOOK (NOTE) The GeneXpert MRSA Assay (FDA approved for NASAL specimens only), is one component of a comprehensive MRSA colonization surveillance program. It is not intended to diagnose MRSA infection nor to guide or monitor treatment for MRSA infections. Test performance is not FDA approved in patients less than 19 years old. Performed at [facility], [place] [address] [place], [place] [address]   Culture, blood (x 2)     Status: None (Preliminary result)   Collection Time: 12/02/20  6:34 PM   Specimen: BLOOD  Result Value Ref Range Status   Specimen Description BLOOD LEFT ANTECUBITAL  Final   Special Requests AEROBIC BOTTLE ONLY Blood Culture adequate volume  Final   Culture   Final    NO GROWTH 4 DAYS Performed at [facility], [place] [address] [place], [place] [address]    Report Status PENDING  Incomplete  Culture, blood (x 2)     Status: None (Preliminary result)   Collection Time: 12/02/20  6:34 PM   Specimen: BLOOD  Result Value Ref Range Status   Specimen Description BLOOD LEFT ANTECUBITAL  Final   Special Requests IN PEDIATRIC BOTTLE Blood Culture adequate volume  Final   Culture   Final    NO GROWTH 4 DAYS Performed at [facility], [place] [address] [place], [place] [address]    Report Status PENDING  Incomplete  Surgical pcr screen  Status: Abnormal   Collection Time: 12/20/2020  7:57 AM   Specimen: Nasal Mucosa; Nasal Swab  Result Value Ref Range Status   MRSA, PCR POSITIVE (A) NEGATIVE Final    Comment: RESULT CALLED TO, READ BACK BY AND VERIFIED WITH: RN S CLOTER [id number] AT 4 AM BY CM     Staphylococcus aureus POSITIVE (A) NEGATIVE Final    Comment: (NOTE) The Xpert SA Assay (FDA approved for NASAL specimens in patients 39 years of age and older), is one component of a comprehensive surveillance program. It is not intended to diagnose infection nor to guide or monitor treatment. Performed at [facility], [place] [address], [place], [place] [address]          Radiology Studies: No results found.      Scheduled Meds:  (feeding supplement) PROSource Plus  30 mL Oral BID BM   sodium chloride   Intravenous Once   sodium chloride   Intravenous Once   amiodarone  200 mg Oral Daily   aspirin EC  81 mg Oral QHS   atorvastatin  40 mg Oral QPM   Chlorhexidine Gluconate Cloth  6 each Topical Q0600   Chlorhexidine Gluconate Cloth  6 each Topical Q0600   collagenase   Topical Daily   darbepoetin (ARANESP) injection - DIALYSIS  150 mcg Intravenous Q Mon-HD   doxercalciferol  1.5 mcg Intravenous Q M,W,F-HD   enoxaparin (LOVENOX) injection  80 mg Subcutaneous Q24H   feeding supplement (NEPRO CARB STEADY)  237 mL Oral BID BM   gabapentin  300 mg Oral QHS   hydrocortisone  25 mg Rectal BID   insulin aspart  0-15 Units Subcutaneous TID WC   insulin detemir  16 Units Subcutaneous QHS   midodrine  5 mg Oral TID WC   multivitamin  1 tablet Oral QHS   ondansetron (ZOFRAN) IV  4 mg Intravenous Once   polyethylene glycol  17 g Oral Daily   senna-docusate  1 tablet Oral BID   sevelamer carbonate  2,400 mg Oral TID with meals   sodium chloride flush  3 mL Intravenous Q12H   sodium hypochlorite   Topical Daily   Continuous Infusions:  sodium chloride     sodium chloride     sodium chloride 10 mL/hr at 12/04/20 0300   ceFEPime (MAXIPIME) IV Stopped (12/12/2020 1857)   metronidazole Stopped (12/06/20 0305)   [START ON 12/08/2020] vancomycin            Aline August, MD [facility] 12/06/2020, 1:43 PM

## 2020-12-06 NOTE — Progress Notes (Signed)
ANTICOAGULATION CONSULT NOTE - Follow Up Consult  Pharmacy Consult for Lovenox Indication: atrial fibrillation  Allergies  Allergen Reactions   Sulfa Antibiotics Swelling    Patient Measurements: Height: '5\' 4"'$  (162.6 cm) Weight: 82.3 kg (181 lb 7 oz) IBW/kg (Calculated) : 54.7  Vital Signs: Temp: 98 F (36.7 C) (08/10 0943) Temp Source: Oral (08/10 0943) BP: 90/44 (08/10 1130) Pulse Rate: 79 (08/10 1130)  Labs: Recent Labs    12/04/20 0707 12/24/2020 0404 12/06/20 0635  HGB 7.8*  --  8.1*  HCT 25.2*  --  25.5*  PLT 756*  --  734*  CREATININE 8.83* 5.25* 6.62*     Estimated Creatinine Clearance: 9 mL/min (A) (by C-G formula based on SCr of 6.62 mg/dL (H)).   Assessment: Lovenox for h/o Afib. PTA apixaban '5mg'$  BID on hold for BL AKA planned week of 8/8. Pharmacy consulted to dose lovenox for h/o afib in setting of ESRD- HD.  Now patient is s/p bilateral AKA done on 8/9.  Today Hgb 8.1 stable. Plts elevated /stable in =700s.   Weight decreased to 82.3 kg thus will derease lovenox dose.   Goal of Therapy:  Monitor platelets by anticoagulation protocol: Yes   Plan:  Decrease Lovenox to 80 mg SQ every 24 hours  - Monitor CBC q72h on LMWH and s/s of bleeding  Nicole Cella, [place] Clinical Pharmacist [phone] 12/06/2020 12:09 PM

## 2020-12-06 NOTE — Plan of Care (Signed)
°  Problem: Activity: °Goal: Risk for activity intolerance will decrease °Outcome: Progressing °  °Problem: Nutrition: °Goal: Adequate nutrition will be maintained °Outcome: Progressing °  °Problem: Skin Integrity: °Goal: Risk for impaired skin integrity will decrease °Outcome: Progressing °  °

## 2020-12-06 NOTE — Progress Notes (Deleted)
Pending LTACH

## 2020-12-06 NOTE — Final Progress Note (Addendum)
  Progress Note    12/06/2020 7:48 AM 1 Day Post-Op  Subjective:  somnolent   Vitals:   12/22/2020 2039 12/06/20 0539  BP: (!) 128/50 126/61  Pulse: 79 79  Resp: 16 20  Temp: 97.6 F (36.4 C) 97.8 F (36.6 C)  SpO2: 98% 95%   Physical Exam: Cardiac:  regular Lungs:  non labored Incisions:  bilateral AKA dressings clean, dry and intact Neurologic: somnolent  CBC    Component Value Date/Time   WBC 44.9 (H) 12/06/2020 0635   RBC 3.34 (L) 12/06/2020 0635   HGB 8.1 (L) 12/06/2020 0635   HGB 11.0 (L) 10/18/2020 1505   HCT 25.5 (L) 12/06/2020 0635   HCT 36.6 10/18/2020 1505   PLT 734 (H) 12/06/2020 0635   PLT 495 (H) 10/18/2020 1505   MCV 76.3 (L) 12/06/2020 0635   MCV 82 10/18/2020 1505   MCH 24.3 (L) 12/06/2020 0635   MCHC 31.8 12/06/2020 0635   RDW 20.5 (H) 12/06/2020 0635   RDW 16.4 (H) 10/18/2020 1505   LYMPHSABS 1.3 12/02/2020 1834   LYMPHSABS 1.9 10/18/2020 1505   MONOABS 1.9 (H) 12/02/2020 1834   EOSABS 0.1 12/02/2020 1834   EOSABS 0.2 10/18/2020 1505   BASOSABS 0.1 12/02/2020 1834   BASOSABS 0.1 10/18/2020 1505    BMET    Component Value Date/Time   NA 133 (L) 12/06/2020 0635   NA 138 10/18/2020 1505   K 4.9 12/06/2020 0635   CL 99 12/06/2020 0635   CO2 22 12/06/2020 0635   GLUCOSE 111 (H) 12/06/2020 0635   BUN 50 (H) 12/06/2020 0635   BUN 13 10/18/2020 1505   CREATININE 6.62 (H) 12/06/2020 0635   CALCIUM 8.7 (L) 12/06/2020 0635   GFRNONAA 7 (L) 12/06/2020 0635   GFRAA 8 (L) 04/06/2020 1113    INR    Component Value Date/Time   INR 1.5 (H) 12/02/2020 1834     Intake/Output Summary (Last 24 hours) at 12/06/2020 0748 Last data filed at 12/06/2020 0745 Gross per 24 hour  Intake 1364.27 ml  Output 100 ml  Net 1264.27 ml     Assessment/Plan:  65 y.o. female is s/p bilateral AKA 1 Day Post-Op   Bilateral AKA dressings clean dry and intact Dressing take down tomorrow PT/ OT eval Continue local wound care   Karoline Caldwell,  PA-C [facility] [phone] 12/06/2020 7:48 AM  Agree with above. Will change dressings in a.m. Will arrange follow up for staple removal in one month.  Ruta Hinds, MD [facility] [place] Office: [phone]

## 2020-12-06 NOTE — TOC Progression Note (Signed)
Transition of Care [facility]) - Progression Note    Patient Details  Name: Christy Zhang MRN: [id number] Date of Birth: 1956-03-15  [facility] CM/SW Contact  Sharin Mons, RN Phone Number: 12/06/2020, 3:11 PM  Clinical Narrative:    Consult received: LTAC placement. Spoke with daughter [name] regarding matter. [name] gives ok for pt to transition to a  LTAC. However, [name] would like to become educated on what they provide. I have reached out to [place] with [facility] and  Raquel Sarna with  [facility]. Anderson [facility] /[facility]  informed  NCM they are currently without HD beds, and states pt has to be able to sit in a recliner with HD in order to extend bed offer if bed presents.  Voice message left with [facility], awaiting response.  TOC team will continue to monitor and assist with needs ....  Expected Discharge Plan: Long Term Acute Care (LTAC) Barriers to Discharge: Continued Medical Work up  Expected Discharge Plan and Services Expected Discharge Plan: [place] (LTAC)                                               Social Determinants of Health (SDOH) Interventions    Readmission Risk Interventions Readmission Risk Prevention Plan 09/18/2020  Transportation Screening Complete  Medication Review ([place]) Complete  PCP or Specialist appointment within 3-5 days of discharge Complete  HRI or [place] Complete  SW Recovery Care/Counseling Consult Not Complete  [place] Not Applicable  Some recent data might be hidden

## 2020-12-06 NOTE — Progress Notes (Signed)
Physical Therapy Wound Treatment Patient Details  Name: Christy Zhang MRN: [id number] Date of Birth: 08/03/1955  Today's Date: 12/06/2020 Time: 1350-1455 Time Calculation (min): 65 min  Subjective  Subjective Assessment Subjective: Lethargic but agreeable to hydrotherapy. Not medicated due to lethargy. Patient and Family Stated Goals: None stated Date of Onset:  (Unknown) Prior Treatments:  (Dressing changes)  Pain Score:  Not medicated due to lethargy. Asking for pain meds during session and RN called. When she arrived, pt sleeping and unable to answer questions, so no medicated given  Wound Assessment  Pressure Injury 11/28/20 Sacrum Unstageable - Full thickness tissue loss in which the base of the injury is covered by slough (yellow, tan, gray, green or brown) and/or eschar (tan, brown or black) in the wound bed. (Active)  Wound Image   12/06/20 1457  Dressing Type Barrier Film (skin prep);Foam - Lift dressing to assess site every shift;Gauze (Comment);Moist to moist;Normal saline moist dressing 12/06/20 1457  Dressing Changed;Clean;Dry;Intact 12/06/20 1457  Dressing Change Frequency Daily 12/06/20 1457  State of Healing Eschar 12/06/20 1457  Site / Wound Assessment Black;Pink;Yellow 12/06/20 1457  % Wound base Red or Granulating 35% 12/06/20 1457  % Wound base Yellow/Fibrinous Exudate 15% 12/06/20 1457  % Wound base Black/Eschar 50% 12/06/20 1457  % Wound base Other/Granulation Tissue (Comment) 0% 12/06/20 1457  Peri-wound Assessment Intact 12/06/20 1457  Wound Length (cm) 13 cm 12/06/20 1400  Wound Width (cm) 9 cm 12/06/20 1400  Wound Depth (cm) 2 cm 12/06/20 1400  Wound Surface Area (cm^2) 117 cm^2 12/06/20 1400  Wound Volume (cm^3) 234 cm^3 12/06/20 1400  Tunneling (cm) 0 12/06/20 1457  Undermining (cm) 2.5 cm at 9:00, 1.9 cm at 6:00 12/06/20 1400  Margins Unattached edges (unapproximated) 12/06/20 1457  Drainage Amount Minimal 12/06/20 1457  Drainage Description  Purulent;Odor;Green 12/06/20 1457  Treatment Debridement (Selective);Hydrotherapy (Pulse lavage);Packing (Saline gauze) 12/06/20 1457     Pressure Injury 11/28/20 Buttocks Left Unstageable - Full thickness tissue loss in which the base of the injury is covered by slough (yellow, tan, gray, green or brown) and/or eschar (tan, brown or black) in the wound bed. (Active)  Wound Image   12/06/20 1457  Dressing Type Barrier Film (skin prep);Foam - Lift dressing to assess site every shift;Gauze (Comment);Moist to moist;Normal saline moist dressing 12/06/20 1457  Dressing Changed;Clean;Dry;Intact 12/06/20 1457  Dressing Change Frequency Daily 12/06/20 1457  State of Healing Eschar 12/06/20 1457  Site / Wound Assessment Black 12/06/20 1457  % Wound base Red or Granulating 5% 12/06/20 1457  % Wound base Yellow/Fibrinous Exudate 25% 12/06/20 1457  % Wound base Black/Eschar 70% 12/06/20 1457  % Wound base Other/Granulation Tissue (Comment) 0% 12/06/20 1457  Peri-wound Assessment Intact 12/06/20 1457  Wound Length (cm) 10.5 cm 12/06/20 1400  Wound Width (cm) 10.2 cm 12/06/20 1400  Wound Depth (cm) 1 cm 12/06/20 1400  Wound Surface Area (cm^2) 107.1 cm^2 12/06/20 1400  Wound Volume (cm^3) 107.1 cm^3 12/06/20 1400  Tunneling (cm) 0 12/06/20 1400  Undermining (cm) 0 12/06/20 1400  Margins Unattached edges (unapproximated) 12/06/20 1457  Drainage Amount Minimal 12/06/20 1457  Drainage Description Purulent 12/06/20 1457  Treatment Debridement (Selective);Hydrotherapy (Pulse lavage);Packing (Saline gauze) 12/06/20 1457  Pressure Injury 11/28/20 Flank Right Unstageable - Full thickness tissue loss in which the base of the injury is covered by slough (yellow, tan, gray, green or brown) and/or eschar (tan, brown or black) in the wound bed. (Active)  Wound Image     12/06/20 1457  Dressing Type Barrier Film (skin prep);Foam - Lift dressing to assess site every shift;Gauze (Comment);Moist to moist;Normal saline  moist dressing 12/06/20 1457  Dressing Changed;Clean;Dry;Intact 12/06/20 1457  Dressing Change Frequency Daily 12/06/20 1457  State of Healing Eschar 12/06/20 1457  Site / Wound Assessment Yellow;Black;Pink 12/06/20 1457  % Wound base Red or Granulating 10% 12/06/20 1457  % Wound base Yellow/Fibrinous Exudate 60% 12/06/20 1457  % Wound base Black/Eschar 30% 12/06/20 1457  % Wound base Other/Granulation Tissue (Comment) 0% 12/06/20 1457  Peri-wound Assessment Intact 12/06/20 1457  Wound Length (cm) 7 cm 12/06/20 1400  Wound Width (cm) 23 cm 12/06/20 1400  Wound Depth (cm) 1 cm 12/06/20 1400  Wound Surface Area (cm^2) 161 cm^2 12/06/20 1400  Wound Volume (cm^3) 161 cm^3 12/06/20 1400  Tunneling (cm) 0 12/06/20 1457  Undermining (cm) 0 12/06/20 1457  Margins Unattached edges (unapproximated) 12/06/20 1457  Drainage Amount Minimal 12/06/20 1457  Drainage Description Purulent;Green 12/06/20 1457  Treatment Debridement (Selective);Hydrotherapy (Pulse lavage);Packing (Saline gauze) 12/06/20 1457     Pressure Injury 11/28/20 Hip Right;Lateral Unstageable - Full thickness tissue loss in which the base of the injury is covered by slough (yellow, tan, gray, green or brown) and/or eschar (tan, brown or black) in the wound bed. (Active)  Wound Image   12/06/20 1457  Dressing Type Barrier Film (skin prep);Foam - Lift dressing to assess site every shift;Gauze (Comment);Moist to moist;Normal saline moist dressing 12/06/20 1457  Dressing Changed;Clean;Dry;Intact 12/06/20 1457  Dressing Change Frequency Daily 12/06/20 1457  State of Healing Eschar 12/06/20 1457  Site / Wound Assessment Black;Yellow;Pink;Bleeding 12/06/20 1457  % Wound base Red or Granulating 10% 12/06/20 1457  % Wound base Yellow/Fibrinous Exudate 60% 12/06/20 1457  % Wound base Black/Eschar 30% 12/06/20 1457  % Wound base Other/Granulation Tissue (Comment) 0% 12/06/20 1457  Peri-wound Assessment Intact 12/06/20 1457  Wound Length  (cm) 5 cm 12/06/20 1400  Wound Width (cm) 4.5 cm 12/06/20 1400  Wound Depth (cm) 1 cm 12/06/20 1400  Wound Surface Area (cm^2) 22.5 cm^2 12/06/20 1400  Wound Volume (cm^3) 22.5 cm^3 12/06/20 1400  Tunneling (cm) 0 12/06/20 1457  Undermining (cm) 0 12/06/20 1457  Margins Unattached edges (unapproximated) 12/06/20 1457  Drainage Amount Minimal 12/06/20 1457  Drainage Description Sanguineous 12/06/20 1457  Treatment Debridement (Selective);Hydrotherapy (Pulse lavage);Packing (Saline gauze) 12/06/20 1457      Hydrotherapy Pulsed lavage therapy - wound location: Sacrum, R hip, L hip, and R flank Pulsed Lavage with Suction (psi): 8 psi Pulsed Lavage with Suction - Normal Saline Used: 2000 mL Pulsed Lavage Tip: Tip with splash shield Selective Debridement Selective Debridement - Location: Sacrum, R hip, L hip, and R flank Selective Debridement - Tools Used: Forceps, Scalpel, Scissors Selective Debridement - Tissue Removed: Black eschar and yellow unviable tissue    Wound Assessment and Plan  Wound Therapy - Assess/Plan/Recommendations Wound Therapy - Clinical Statement: Wound beds and edges appear dry despite ongoing drainage (blue/green color improved overall). Reached out to [facility] regarding appropriate dressing. Dakin's held and NS moist dressing applied until hydrotherapy hears back from [facility] regarding plan. Will continue to benefit from hydrotherapy for selective removal of unviable tissue, to decrease bioburden, and promote wound bed healing. Wound Therapy - Functional Problem List: Decreased tolerance for position changes and OOB Factors Delaying/Impairing Wound Healing: Diabetes Mellitus, Immobility, Multiple medical problems Hydrotherapy Plan: Debridement, Dressing change, Patient/family education, Pulsatile lavage with suction Wound Therapy - Frequency: 6X / week Wound Therapy - Follow Up Recommendations: dressing changes by  RN  Wound Therapy Goals- Improve the function of  patient's integumentary system by progressing the wound(s) through the phases of wound healing (inflammation - proliferation - remodeling) by: Wound Therapy Goals - Improve the function of patient's integumentary system by progressing the wound(s) through the phases of wound healing by: Decrease Necrotic Tissue to: 20% Decrease Necrotic Tissue - Progress: Progressing toward goal Increase Granulation Tissue to: 80% Increase Granulation Tissue - Progress: Progressing toward goal Goals/treatment plan/discharge plan were made with and agreed upon by patient/family: Yes Time For Goal Achievement: 7 days Wound Therapy - Potential for Goals: Good  Goals will be updated until maximal potential achieved or discharge criteria met.  Discharge criteria: when goals achieved, discharge from hospital, MD decision/surgical intervention, no progress towards goals, refusal/missing three consecutive treatments without notification or medical reason.  GP     Charges PT Wound Care Charges $Wound Debridement up to 20 cm: < or equal to 20 cm $ Wound Debridement each add'l 20 sqcm: 9 $PT PLS Gun and Tip: 1 Supply $PT Hydrotherapy Visit: 1 Visit       Thelma Comp 12/06/2020, 3:11 PM

## 2020-12-06 NOTE — Plan of Care (Signed)
  Problem: Clinical Measurements: Goal: Ability to maintain clinical measurements within normal limits will improve Outcome: Progressing Problem: Nutrition: Goal: Adequate nutrition will be maintained Outcome: Progressing   Problem: Coping: Goal: Level of anxiety will decrease Outcome: Progressing   Problem: Elimination: Goal: Will not experience complications related to bowel motility Outcome: Progressing   Problem: Safety: Goal: Ability to remain free from injury will improve Outcome: Progressing   Problem: Skin Integrity: Goal: Risk for impaired skin integrity will decrease Outcome: Progressing   Goal: Diagnostic test results will improve Outcome: Progressing Goal: Respiratory complications will improve Outcome: Progressing Goal: Cardiovascular complication will be avoided Outcome: Progressing

## 2020-12-06 NOTE — Progress Notes (Signed)
Patient ID: Christy Zhang, female   DOB: 1955/11/07, 65 y.o.   MRN: [id number]  [facility] Progress Note   Assessment/ Plan:   1.  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 ESRD:  - Continue HD per MWF schedule  - renal diet when taking PO - With her current wounds on the back/inability to sit in recliner, unable to undertake outpatient dialysis and will need LTAC per charting  3. Anemia CKD: Secondary to end-stage renal disease as well as malnutrition-inflammation complex from multiple wounds on her back/limb gangrene.  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 Nutrition: Albumin level significantly depressed secondary to multifocal sites of inflammation.  Continue ONS.  6. Hypotension - on midodrine 5 mg TID (charted as home dose).    7. Leukocytosis - marked leukocytosis slightly improved. Abx per primary team.  Blood cultures NGTD.  Work-up per primary team  Subjective:   She had HD earlier today with 1.5 kg UF.  Spoke with HD RN and the patient had refused midodrine upstairs apparently.  Lowest BP was in the high 80's per HD RN.  RN states she has a PRN for pain anticipated with hydrotherapy and I recommended holding for now given her sleepiness  Review of systems:  More sleepy today - got a PRN overnight for pain   Objective:   BP (!) 111/52   Pulse 79   Temp 98.7 F (37.1 C) (Oral)   Resp 16   Ht '5\' 4"'$  (1.626 m)   Wt 81 kg   LMP  (LMP Unknown)   SpO2 100%   BMI 30.65 kg/m   Physical Exam:  General adult female in bed in no acute distress HEENT normocephalic atraumatic Neck supple trachea midline Lungs clear  to auscultation bilaterally normal work of breathing at rest  Heart S1S2 no rub Abdomen soft nontender nondistended Extremities bilateral AKA legs wrapped; gangrenous changes to left hand  Psych normal mood and affect when awake Neuro - awakens with exam and oriented to person, place, and location Access: RUE AVG   Labs: BMET Recent Labs  Lab 11/30/20 0316 12/01/20 0309 12/02/20 0437 12/02/20 1834 12/03/20 0254 12/04/20 0707 12/27/2020 0404 12/06/20 0635  NA 131* 133* 134* 134* 134* 133* 133* 133*  K 5.5* 5.1 4.6 4.0 5.4* 5.7* 4.2 4.9  CL 93* 95* 95* 102 95* 92* 96* 99  CO2 19* 22 26 19* '23 25 25 22  '$ GLUCOSE 120* 99 145* 138* 154* 168* 163* 111*  BUN 36* 42* 28* 32* 44* 63* 35* 50*  CREATININE 6.62* 7.97* 5.87* 5.60* 7.31* 8.83* 5.25* 6.62*  CALCIUM 8.9 8.7* 8.4* 6.9* 8.5* 8.6* 8.7* 8.7*  PHOS 4.6 6.0* 4.3  --  5.7* 5.7* 3.6  --    CBC Recent Labs  Lab 12/02/20 1834 12/02/20 2304 12/04/20 0707 12/06/20 0635  WBC 44.2* 59.5* 53.8* 44.9*  NEUTROABS 39.5*  --   --   --   HGB 6.4* 7.6*  7.6* 7.8* 8.1*  HCT 21.5* 24.7*  24.1* 25.2* 25.5*  MCV 78.8* 77.7* 75.7* 76.3*  PLT 623* 770* 756* 734*      Medications:     (  feeding supplement) PROSource Plus  30 mL Oral BID BM   sodium chloride   Intravenous Once   sodium chloride   Intravenous Once   amiodarone  200 mg Oral Daily   aspirin EC  81 mg Oral QHS   atorvastatin  40 mg Oral QPM   Chlorhexidine Gluconate Cloth  6 each Topical Q0600   Chlorhexidine Gluconate Cloth  6 each Topical Q0600   collagenase   Topical Daily   darbepoetin (ARANESP) injection - DIALYSIS  150 mcg Intravenous Q Mon-HD   doxercalciferol  1.5 mcg Intravenous Q M,W,F-HD   enoxaparin (LOVENOX) injection  80 mg Subcutaneous Q24H   feeding supplement (NEPRO CARB STEADY)  237 mL Oral BID BM   gabapentin  300 mg Oral QHS   hydrocortisone  25 mg Rectal BID   insulin aspart  0-15 Units Subcutaneous TID WC   insulin detemir  16 Units Subcutaneous  QHS   midodrine  5 mg Oral TID WC   multivitamin  1 tablet Oral QHS   ondansetron (ZOFRAN) IV  4 mg Intravenous Once   polyethylene glycol  17 g Oral Daily   senna-docusate  1 tablet Oral BID   sevelamer carbonate  2,400 mg Oral TID with meals   sodium chloride flush  3 mL Intravenous Q12H   sodium hypochlorite   Topical Daily   Claudia Desanctis, MD 12/06/2020, 1:48 PM

## 2020-12-06 NOTE — Progress Notes (Signed)
Nutrition Follow-up  DOCUMENTATION CODES:  Not applicable  INTERVENTION:   Recommend initiation of bowel regimen as no BM documented x5 days  -Continue Nepro Shake po BID, each supplement provides 425 kcal and 19 grams protein -Continue PROSource PLUS PO 41ms BID, each supplement provides 100 kcals and 15 grams of protein -Continue renal MVI daily  NUTRITION DIAGNOSIS:  Increased nutrient needs related to chronic illness, wound healing (ESRD on HD) as evidenced by estimated needs. -- ongoing  GOAL:  Patient will meet greater than or equal to 90% of their needs -- progressing  MONITOR:  PO intake, Supplement acceptance, Weight trends, Labs, I & O's, Skin  REASON FOR ASSESSMENT:  Malnutrition Screening Tool   ASSESSMENT:  Pt with PMH significant for ESRD on HD, bilateral BKA, type 2 DM, HTN, PVD, HLD who was recently discharged from the hospital on 7/19 after prolonged hospitalization during which she had AMS, PNA w/ ARF, septic shock, AKI requiring CRRT; required intubation during previous hospitalization. Pt presented to [facility] due to 2x missed HD 2/2 inability to sit down for HD 2/2 pain from decub[facility] with gangrene of L hand.  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   Pt unavailable at time of RD visit, in HD. RN reports pt has been increasingly lethargic and occasionally sleeping through meals. Doing well with supplements.   Pt continues to undergo hydrotherapy for sacral and R hip decubitus ulcers (POA).   MD notes overall prognosis is very poor. GUplanddiscussions pending.   PO Intake: 0-100% x last 8 recorded meals (31% average meal intake)  EDW 89.8 kg Admission weight: 86.2 kg Current weight: 81 kg  Last HD 8/10, net UF 1.5 L No UOP documented x24 hours I/O: -7.25L since admit   Recommend  initiation of bowel regimen as no BM documented x5 days  Medications:  Scheduled Meds:  (feeding supplement) PROSource Plus  30 mL Oral BID BM   sodium chloride   Intravenous Once   sodium chloride   Intravenous Once   amiodarone  200 mg Oral Daily   aspirin EC  81 mg Oral QHS   atorvastatin  40 mg Oral QPM   Chlorhexidine Gluconate Cloth  6 each Topical Q0600   Chlorhexidine Gluconate Cloth  6 each Topical Q0600   collagenase   Topical Daily   darbepoetin (ARANESP) injection - DIALYSIS  150 mcg Intravenous Q Mon-HD   doxercalciferol  1.5 mcg Intravenous Q M,W,F-HD   enoxaparin (LOVENOX) injection  80 mg Subcutaneous Q24H   feeding supplement (NEPRO CARB STEADY)  237 mL Oral BID BM   gabapentin  300 mg Oral QHS   hydrocortisone  25 mg Rectal BID   insulin aspart  0-15 Units Subcutaneous TID WC   insulin detemir  16 Units Subcutaneous QHS   midodrine  5 mg Oral TID WC   multivitamin  1 tablet Oral QHS   ondansetron (ZOFRAN) IV  4 mg Intravenous Once   polyethylene glycol  17 g Oral Daily   senna-docusate  1 tablet Oral BID   sevelamer carbonate  2,400 mg Oral TID with meals   sodium chloride flush  3 mL Intravenous Q12H   sodium hypochlorite   Topical Daily  Continuous Infusions:  sodium chloride     sodium chloride     sodium chloride 10 mL/hr at 12/04/20 0300  ceFEPime (MAXIPIME) IV Stopped (12/07/2020 1857)   metronidazole 500 mg (12/06/20 1356)   [START ON 12/08/2020] vancomycin     Labs: Recent Labs  Lab 12/03/20 0254 12/04/20 0707 12/27/2020 0404 12/06/20 0635  NA 134* 133* 133* 133*  K 5.4* 5.7* 4.2 4.9  CL 95* 92* 96* 99  CO2 '23 25 25 22  '$ BUN 44* 63* 35* 50*  CREATININE 7.31* 8.83* 5.25* 6.62*  CALCIUM 8.5* 8.6* 8.7* 8.7*  PHOS 5.7* 5.7* 3.6  --   GLUCOSE 154* 168* 163* 111*  CBGs 108-111  Diet Order:   Diet Order             Diet renal/carb modified with fluid restriction Diet-HS Snack? Nothing; Fluid restriction: 1200 mL Fluid; Room service appropriate?  Yes; Fluid consistency: Thin  Diet effective now                  EDUCATION NEEDS:  No education needs have been identified at this time  Skin:  Skin Assessment: Skin Integrity Issues: Skin Integrity Issues:: Unstageable, DTI, Incisions, Other (Comment) DTI: R breast, R flank Stage II: N/A Unstageable: acrum, L buttocks, lower vertebral column, L/R ischial tuberosity, lower R back, L knee, R flank, R hip Incisions: open incision groin, bilateral BKA Other: non-pressure wounds to R knee R arm, ischemic L hand/fingers  Last BM:  8/5  Height:  Ht Readings from Last 1 Encounters:  12/22/2020 '5\' 4"'$  (1.626 m)   Weight:  Wt Readings from Last 1 Encounters:  12/06/20 81 kg   BMI:  Body mass index is 30.65 kg/m.  Estimated Nutritional Needs:  Kcal:  2200-2400 Protein:  110-130 grams Fluid:  1L+UOP    Larkin Ina, MS, RD, LDN (she/her/hers) RD pager number and weekend/on-call pager number located in [place].

## 2020-12-06 NOTE — Progress Notes (Signed)
Pharmacy Antibiotic Note  Christy Zhang is a 65 y.o. female admitted on 12/08/2020 with  infection of unknown source .   Pharmacy was consulted  12/02/20 for Cefepime and vancomycin dosing.   WBC down to 44.9. Afebrile. ESRD on HD every MWF.   S/p bilateral AKA on 8/9.   Patient received extra vancomycin 1000 mg IV in surgery on 8/9. Checked a pre HD vancomycin random level today 8/10 which is 42 mcg/ml, thus no vancomycin needed post HD today.     Plan: Hold Vancomycin today 8/10. With next HD on Friday 8/12 continue Vancomycin 1 gm IV Q HD on MWF Monitor CBC, cultures and clinical progress Vanc levels as indicated Continue Cefepime 1 g IV q24hr. Flagyl continues per MD   Height: '5\' 4"'$  (162.6 cm) Weight: 82.3 kg (181 lb 7 oz) IBW/kg (Calculated) : 54.7  Temp (24hrs), Avg:97.9 F (36.6 C), Min:97.6 F (36.4 C), Max:98 F (36.7 C)  Recent Labs  Lab 12/02/20 1529 12/02/20 1834 12/02/20 2304 12/03/20 0254 12/04/20 0707 12/27/2020 0404 12/06/20 0635 12/06/20 0955  WBC 54.5* 44.2* 59.5*  --  53.8*  --  44.9*  --   CREATININE  --  5.60*  --  7.31* 8.83* 5.25* 6.62*  --   LATICACIDVEN  --  1.6 1.4  --   --   --   --   --   VANCORANDOM  --   --   --   --   --   --   --  42     Estimated Creatinine Clearance: 9 mL/min (A) (by C-G formula based on SCr of 6.62 mg/dL (H)).    Allergies  Allergen Reactions   Sulfa Antibiotics Swelling    Antimicrobials this admission: Cefepime 8/6 >>  Vancomycin 8/6 >>  Flagyl 8/6 >>   Dose adjustments this admission: 8/10 Vanc Random pre HD =42 mcg/ml  (likely due to extra vanc 1g dose given in OR on 8/9)-  vancomycin dose held x1 on 8/10 post HD.  Microbiology results: 8/2 MRSA PCR: positive  8/1 blood x 2: negative F  8/1 COVID and flu: negative  8/6 Bcx: ngtd 8/9  MRSA PCR: positive,  SA positive  Thank you for allowing pharmacy to be part of this patients care team.  Nicole Cella, [facility] Pharmacist   [phone] 12/06/2020  11:51 AM  Please refer to AMION for unit-specific pharmacist

## 2020-12-07 DIAGNOSIS — Z992 Dependence on renal dialysis: Secondary | ICD-10-CM | POA: Diagnosis not present

## 2020-12-07 DIAGNOSIS — N186 End stage renal disease: Secondary | ICD-10-CM | POA: Diagnosis not present

## 2020-12-07 DIAGNOSIS — I480 Paroxysmal atrial fibrillation: Secondary | ICD-10-CM | POA: Diagnosis not present

## 2020-12-07 DIAGNOSIS — I96 Gangrene, not elsewhere classified: Secondary | ICD-10-CM | POA: Diagnosis not present

## 2020-12-07 LAB — CBC
HCT: 22.9 % — ABNORMAL LOW (ref 36.0–46.0)
Hemoglobin: 7.1 g/dL — ABNORMAL LOW (ref 12.0–15.0)
MCH: 24.1 pg — ABNORMAL LOW (ref 26.0–34.0)
MCHC: 31 g/dL (ref 30.0–36.0)
MCV: 77.9 fL — ABNORMAL LOW (ref 80.0–100.0)
Platelets: 705 10*3/uL — ABNORMAL HIGH (ref 150–400)
RBC: 2.94 MIL/uL — ABNORMAL LOW (ref 3.87–5.11)
RDW: 21 % — ABNORMAL HIGH (ref 11.5–15.5)
WBC: 49.1 10*3/uL — ABNORMAL HIGH (ref 4.0–10.5)
nRBC: 0.2 % (ref 0.0–0.2)

## 2020-12-07 LAB — CULTURE, BLOOD (ROUTINE X 2)
Culture: NO GROWTH
Culture: NO GROWTH
Special Requests: ADEQUATE
Special Requests: ADEQUATE

## 2020-12-07 LAB — GLUCOSE, CAPILLARY
Glucose-Capillary: 262 mg/dL — ABNORMAL HIGH (ref 70–99)
Glucose-Capillary: 225 mg/dL — ABNORMAL HIGH (ref 70–99)
Glucose-Capillary: 213 mg/dL — ABNORMAL HIGH (ref 70–99)
Glucose-Capillary: 230 mg/dL — ABNORMAL HIGH (ref 70–99)

## 2020-12-07 LAB — BASIC METABOLIC PANEL
Anion gap: 14 (ref 5–15)
BUN: 41 mg/dL — ABNORMAL HIGH (ref 8–23)
CO2: 23 mmol/L (ref 22–32)
Calcium: 8.6 mg/dL — ABNORMAL LOW (ref 8.9–10.3)
Chloride: 98 mmol/L (ref 98–111)
Creatinine, Ser: 5.16 mg/dL — ABNORMAL HIGH (ref 0.44–1.00)
GFR, Estimated: 9 mL/min — ABNORMAL LOW (ref >60)
Glucose, Bld: 276 mg/dL — ABNORMAL HIGH (ref 70–99)
Potassium: 4.6 mmol/L (ref 3.5–5.1)
Sodium: 135 mmol/L (ref 135–145)

## 2020-12-07 LAB — PREPARE RBC (CROSSMATCH)

## 2020-12-07 LAB — SURGICAL PATHOLOGY

## 2020-12-07 LAB — HEMOGLOBIN AND HEMATOCRIT, BLOOD
HCT: 25.2 % — ABNORMAL LOW (ref 36.0–46.0)
Hemoglobin: 8 g/dL — ABNORMAL LOW (ref 12.0–15.0)

## 2020-12-07 MED ORDER — CHLORHEXIDINE GLUCONATE CLOTH 2 % EX PADS
6.0000 | MEDICATED_PAD | Freq: Every day | CUTANEOUS | Status: DC
Start: 2020-12-08 — End: 2020-12-11

## 2020-12-07 MED ORDER — SODIUM CHLORIDE 0.9% IV SOLUTION: Freq: Once | INTRAVENOUS | Status: AC

## 2020-12-07 MED ORDER — APIXABAN 5 MG PO TABS
5.0000 mg | ORAL_TABLET | Freq: Two times a day (BID) | ORAL | Status: DC
  Administered 2020-12-07 – 2020-12-08 (×2): 5 mg via ORAL
  Filled 2020-12-07 (×2): qty 1

## 2020-12-07 MED ORDER — CHLORHEXIDINE GLUCONATE CLOTH 2 % EX PADS
6.0000 | MEDICATED_PAD | Freq: Every day | CUTANEOUS | Status: DC
  Administered 2020-12-08 – 2020-12-10 (×3): 6 via TOPICAL

## 2020-12-07 MED ORDER — MUPIROCIN 2 % EX OINT
1.0000 | TOPICAL_OINTMENT | Freq: Two times a day (BID) | CUTANEOUS | Status: AC
Start: 2020-12-07 — End: 2020-12-11
  Administered 2020-12-07 – 2020-12-11 (×10): 1 via NASAL
  Filled 2020-12-07 (×3): qty 22

## 2020-12-07 MED ORDER — DAKINS (1/4 STRENGTH) 0.125 % EX SOLN
Freq: Two times a day (BID) | CUTANEOUS | Status: AC
  Filled 2020-12-07: qty 473

## 2020-12-07 NOTE — Plan of Care (Signed)
  Problem: Clinical Measurements: Goal: Will remain free from infection Outcome: Progressing   Problem: Activity: Goal: Risk for activity intolerance will decrease Outcome: Progressing   Problem: Nutrition: Goal: Adequate nutrition will be maintained Outcome: Progressing   Problem: Skin Integrity: Goal: Risk for impaired skin integrity will decrease Outcome: Progressing   Problem: Fluid Volume: Goal: Compliance with measures to maintain balanced fluid volume will improve Outcome: Progressing

## 2020-12-07 NOTE — Progress Notes (Addendum)
Progress Note    12/07/2020 7:37 AM 2 Days Post-Op  Subjective:  Wants ice o/w no complaints   Vitals:   12/06/20 2014 12/07/20 0506  BP: 111/68 (!) 105/51  Pulse: 86 77  Resp: 16 19  Temp: 98.4 F (36.9 C) 98.4 F (36.9 C)  SpO2: 100% 100%    Physical Exam:  General appearance: Awake, alert in no apparent distress Cardiac: Heart rate and rhythm are regular Respirations: Nonlabored Extremities: Bilateral AKA Amputation site incisions are well approximated without  hematoma.  Small amount of oozing from left incision. Anterior and posterior flaps are warm and well-perfused   (Photos taked..failing to up-load at this time.)  CBC    Component Value Date/Time   WBC 44.9 (H) 12/06/2020 0635   RBC 3.34 (L) 12/06/2020 0635   HGB 8.1 (L) 12/06/2020 0635   HGB 11.0 (L) 10/18/2020 1505   HCT 25.5 (L) 12/06/2020 0635   HCT 36.6 10/18/2020 1505   PLT 734 (H) 12/06/2020 0635   PLT 495 (H) 10/18/2020 1505   MCV 76.3 (L) 12/06/2020 0635   MCV 82 10/18/2020 1505   MCH 24.3 (L) 12/06/2020 0635   MCHC 31.8 12/06/2020 0635   RDW 20.5 (H) 12/06/2020 0635   RDW 16.4 (H) 10/18/2020 1505   LYMPHSABS 1.3 12/02/2020 1834   LYMPHSABS 1.9 10/18/2020 1505   MONOABS 1.9 (H) 12/02/2020 1834   EOSABS 0.1 12/02/2020 1834   EOSABS 0.2 10/18/2020 1505   BASOSABS 0.1 12/02/2020 1834   BASOSABS 0.1 10/18/2020 1505    BMET    Component Value Date/Time   NA 133 (L) 12/06/2020 0635   NA 138 10/18/2020 1505   K 4.9 12/06/2020 0635   CL 99 12/06/2020 0635   CO2 22 12/06/2020 0635   GLUCOSE 111 (H) 12/06/2020 0635   BUN 50 (H) 12/06/2020 0635   BUN 13 10/18/2020 1505   CREATININE 6.62 (H) 12/06/2020 0635   CALCIUM 8.7 (L) 12/06/2020 0635   GFRNONAA 7 (L) 12/06/2020 0635   GFRAA 8 (L) 04/06/2020 1113     Intake/Output Summary (Last 24 hours) at 12/07/2020 0737 Last data filed at 12/07/2020 0000 Gross per 24 hour  Intake 916.91 ml  Output 1500 ml  Net -583.09 ml    HOSPITAL  MEDICATIONS Scheduled Meds:  (feeding supplement) PROSource Plus  30 mL Oral BID BM   sodium chloride   Intravenous Once   sodium chloride   Intravenous Once   amiodarone  200 mg Oral Daily   aspirin EC  81 mg Oral QHS   atorvastatin  40 mg Oral QPM   Chlorhexidine Gluconate Cloth  6 each Topical Q0600   Chlorhexidine Gluconate Cloth  6 each Topical Q0600   collagenase   Topical Daily   darbepoetin (ARANESP) injection - DIALYSIS  150 mcg Intravenous Q Mon-HD   doxercalciferol  1.5 mcg Intravenous Q M,W,F-HD   enoxaparin (LOVENOX) injection  80 mg Subcutaneous Q24H   feeding supplement (NEPRO CARB STEADY)  237 mL Oral BID BM   gabapentin  300 mg Oral QHS   hydrocortisone  25 mg Rectal BID   insulin aspart  0-15 Units Subcutaneous TID WC   insulin detemir  16 Units Subcutaneous QHS   midodrine  5 mg Oral TID WC   multivitamin  1 tablet Oral QHS   ondansetron (ZOFRAN) IV  4 mg Intravenous Once   polyethylene glycol  17 g Oral Daily   senna-docusate  1 tablet Oral BID   sevelamer carbonate  2,400 mg Oral TID with meals   sodium chloride flush  3 mL Intravenous Q12H   sodium hypochlorite   Topical Daily   Continuous Infusions:  sodium chloride     sodium chloride     sodium chloride 10 mL/hr at 12/04/20 0300   ceFEPime (MAXIPIME) IV 1 g (12/06/20 1913)   metronidazole 500 mg (12/07/20 0248)   [START ON 12/08/2020] vancomycin     PRN Meds:.sodium chloride, sodium chloride, sodium chloride, acetaminophen **OR** acetaminophen, alteplase, diphenhydrAMINE, heparin, heparin, HYDROmorphone (DILAUDID) injection, labetalol, lidocaine (PF), lidocaine-prilocaine, ondansetron **OR** ondansetron (ZOFRAN) IV, oxyCODONE, pentafluoroprop-tetrafluoroeth, phenol, sodium chloride flush  Assessment and Plan: POD 2 bilateral AKA. Healthy appearing, well perfused flaps   -DVT prophylaxis:  Lovenox   Risa Grill, PA-C [facility] [phone] 12/07/2020  7:37 AM   Agree with  above.  AKA is currently healing.  We will arrange follow-up to have staples removed in approximately 1 month.  Call if questions.  Ruta Hinds, MD [facility] [place] Office: [phone]

## 2020-12-07 NOTE — Progress Notes (Signed)
Patient ID: Christy Zhang, female   DOB: December 14, 1955, 65 y.o.   MRN: [id number]  [facility] Progress Note   Assessment/ Plan:   1.  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 ESRD:  - Continue HD per MWF schedule  - With her current wounds on the back/inability to sit in recliner, unable to undertake outpatient dialysis and will need LTAC per charting  3. Anemia CKD: Secondary to end-stage renal disease as well as malnutrition-inflammation complex from multiple wounds on her back/limb gangrene.  On aranesp 150 mcg every Monday.  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 Nutrition: Albumin level significantly depressed secondary to multifocal sites of inflammation.    6. Hypotension - on midodrine 5 mg TID (charted as home dose).    7. Leukocytosis - marked leukocytosis. Abx per primary team.  Blood cultures NGTD.  Work-up per primary team   Subjective:   She last had HD on 8/10 with 1.5 kg UF.  She is sleepy today after a PRN per her RN; he reports that she was more awake this am and had hydrotherapy for her wound.  Review of systems:  Unable to obtain 2/2 ams   Objective:   BP (!) 92/40 (BP Location: Left Arm) Comment: RN aware  Pulse 84   Temp 98.1 F (36.7 C) (Oral)   Resp 20   Ht '5\' 4"'$  (1.626 m)   Wt 81 kg   LMP  (LMP Unknown)   SpO2 100%   BMI 30.65 kg/m   Physical Exam:   General adult female in bed in no acute distress HEENT normocephalic atraumatic Neck supple trachea midline Lungs clear to auscultation bilaterally normal work of breathing at rest  Heart S1S2 no rub Abdomen soft nontender nondistended Extremities bilateral  AKA; gangrenous changes to left hand  Psych no agitation Neuro - sleepy today and does not answer questions Access: RUE AVF bruit and thrill   Labs: BMET Recent Labs  Lab 12/01/20 0309 12/02/20 0437 12/02/20 1834 12/03/20 0254 12/04/20 0707 12/25/2020 0404 12/06/20 0635 12/07/20 0803  NA 133* 134* 134* 134* 133* 133* 133* 135  K 5.1 4.6 4.0 5.4* 5.7* 4.2 4.9 4.6  CL 95* 95* 102 95* 92* 96* 99 98  CO2 22 26 19* '23 25 25 22 23  '$ GLUCOSE 99 145* 138* 154* 168* 163* 111* 276*  BUN 42* 28* 32* 44* 63* 35* 50* 41*  CREATININE 7.97* 5.87* 5.60* 7.31* 8.83* 5.25* 6.62* 5.16*  CALCIUM 8.7* 8.4* 6.9* 8.5* 8.6* 8.7* 8.7* 8.6*  PHOS 6.0* 4.3  --  5.7* 5.7* 3.6  --   --    CBC Recent Labs  Lab 12/02/20 1834 12/02/20 2304 12/04/20 0707 12/06/20 0635 12/07/20 0803  WBC 44.2* 59.5* 53.8* 44.9* 49.1*  NEUTROABS 39.5*  --   --   --   --   HGB 6.4* 7.6*  7.6* 7.8* 8.1* 7.1*  HCT 21.5* 24.7*  24.1* 25.2* 25.5* 22.9*  MCV 78.8* 77.7* 75.7* 76.3* 77.9*  PLT 623* 770* 756* 734* 705*      Medications:     (feeding supplement) PROSource Plus  30 mL Oral BID BM  amiodarone  200 mg Oral Daily   apixaban  5 mg Oral BID   aspirin EC  81 mg Oral QHS   atorvastatin  40 mg Oral QPM   Chlorhexidine Gluconate Cloth  6 each Topical Q0600   [START ON 12/08/2020] Chlorhexidine Gluconate Cloth  6 each Topical Q0600   collagenase   Topical Daily   darbepoetin (ARANESP) injection - DIALYSIS  150 mcg Intravenous Q Mon-HD   doxercalciferol  1.5 mcg Intravenous Q M,W,F-HD   feeding supplement (NEPRO CARB STEADY)  237 mL Oral BID BM   gabapentin  300 mg Oral QHS   hydrocortisone  25 mg Rectal BID   insulin aspart  0-15 Units Subcutaneous TID WC   insulin detemir  16 Units Subcutaneous QHS   midodrine  5 mg Oral TID WC   multivitamin  1 tablet Oral QHS   mupirocin ointment  1 application Nasal BID   polyethylene glycol  17 g Oral Daily   senna-docusate  1 tablet Oral BID   sevelamer carbonate   2,400 mg Oral TID with meals   sodium chloride flush  3 mL Intravenous Q12H   sodium hypochlorite   Topical BID   Claudia Desanctis, MD 12/07/2020, 1:23 PM

## 2020-12-07 NOTE — Consult Note (Addendum)
[place] Nurse wound follow up Patient receiving care in [place] to be completed by PT to sacral and hip wounds then Dakins to by applied x 3 days followed by Santyl.  BEDSIDE RN TO CHANGE THESE DRESSINGS ON SUNDAYS!!! Bedside RN to change dressing on night shift with Dakin's x 3 days then change to Santyl beginning 12/10/20  Apply Santyl to sacral and hip wounds in a nickel thick layer. Cover with a saline moistened gauze, then dry gauze or ABD pad.  Change daily.  Cathlean Marseilles Tamala Julian, MSN, RN, [place], Lysle Pearl, [facility] Wound Treatment Associate Pager [phone]

## 2020-12-07 NOTE — Consult Note (Signed)
   [facility] Inpatient Consult   12/07/2020  Christy Zhang [facility] 1955-07-13 [id number]  Follow up:  Medicare CMS DCE, [place] Embedded CCM patient  Chart reviewed for length of stay and progress assessed. Patient is being considered for a LTACH transition of care level of care.  Plan:  Continue to follow progress.  For questions,   Natividad Brood, RN BSN [facility] Liaison  [phone] business mobile phone Toll free office [phone]  Fax number: [phone] [name]

## 2020-12-07 NOTE — Progress Notes (Addendum)
Patient ID: Christy Zhang, female   DOB: [birth year], 65 y.o.   MRN: [id number]  PROGRESS NOTE    KALLA ZUPKO  [id number] DOB: [birth year] DOA: 11/29/2020 PCP: Claretta Fraise, MD   Brief Narrative:   36 old female with history of bilateral BKA, ESRD on dialysis,  type 2 diabetes, hypertension, peripheral vascular disease, hyperlipidemia, recently discharged from here on 11/14/2020 after prolonged hospitalization when she was admitted with altered mental status and had to be intubated for airway protection and also underwent CRRT in the setting of septic shock from pneumonia causing acute respiratory failure with hypoxia, AKI.  She presented at [facility] ED because it was too painful to sit down for dialysis due to her decubitus ulcer and she also missed 2 session of dialysis.  Patient was sent to [facility] from [facility] for further management.  Nephrology,orthopedics,palliative care vascular surgery consulted.  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 -Patient presenting with nonhealing wounds on bilateral BKA stumps: Vascular surgery following, status post bilateral AKA on 12/01/2020. -Leukocytosis slightly improving.  Labs pending for today.  We will follow vascular surgery recommendations regarding duration of antibiotic treatment.  Sacral and right hip decubitus ulcers: Present on admission -Currently undergoing hydrotherapy as per general surgery recommendations (did not recommend debridement).  Continue Santyl  End-stage renal disease Anemia of chronic disease Hyperkalemia: Resolved -Continue dialysis as per nephrology schedule. -she is unable to sit for dialysis and at this point we are not sure how she will receive outpt  dialysis.  Will need LTAC as per nephrology -Hemoglobin currently stable.  Thrombocytosis -Possibly reactive.  Monitor  Mild hyponatremia -Nephrology managing by dialysis  Obesity-outpatient follow-up  Paroxysmal A. Fib -Continue amiodarone.  Eliquis on hold.  Restart Eliquis if okay with vascular surgery.  Left renal mass -MRI was attempted x4 but subsequently canceled  Diabetes mellitus type 2 -Continue Levemir and CBGs with SSI.  A1c 6.4 on 10/27/2020  Generalized deconditioning Goals of care -Overall prognosis is very poor.  Palliative care has evaluated the patient during this hospitalization.  Patient remains full code.   DVT prophylaxis: Lovenox Code Status: Full Family Communication: spoke to daughter on phone on 12/07/2020 Disposition Plan: Status is: Inpatient  Remains inpatient appropriate because:Inpatient level of care appropriate due to severity of illness  Dispo: The patient is from: Home              Anticipated d/c is to: LTAC once bed is available              Patient currently is not medically stable to d/c.   Difficult to place patient No    Consultants: Nephrology/vascular surgery/general surgery  Procedures: Bilateral AKA on 12/18/2020  Antimicrobials:  Anti-infectives (From admission, onward)    Start     Dose/Rate Route Frequency Ordered Stop   12/08/20 1200  vancomycin (VANCOCIN) IVPB 1000 mg/200 mL premix        1,000 mg 200 mL/hr over 60 Minutes Intravenous Every M-W-F (Hemodialysis) 12/06/20 1138     12/06/2020 0600  vancomycin (VANCOCIN) IVPB 1000 mg/200 mL premix        1,000 mg 200 mL/hr over 60 Minutes Intravenous To Surgery 12/04/20 1557 12/21/2020 1038   12/04/20 1200  vancomycin (VANCOCIN) IVPB 1000 mg/200 mL premix  Status:  Discontinued        1,000 mg 200 mL/hr over 60 Minutes Intravenous Every M-W-F (Hemodialysis) 12/02/20 1734 12/06/20 1138   12/03/20 1800  ceFEPIme (MAXIPIME) 1 g in sodium chloride 0.9 % 100 mL IVPB        1  g 200 mL/hr over 30 Minutes Intravenous Every 24 hours 12/02/20 1734     12/02/20 1830  vancomycin (VANCOREADY) IVPB 1750 mg/350 mL        1,750 mg 175 mL/hr over 120 Minutes Intravenous  Once 12/02/20 1734 12/02/20 2156   12/02/20 1800  ceFEPIme (MAXIPIME) 2 g in sodium chloride 0.9 % 100 mL IVPB        2 g 200 mL/hr over 30 Minutes Intravenous  Once 12/02/20 1707 12/02/20 2003   12/02/20 1800  metroNIDAZOLE (FLAGYL) IVPB 500 mg        500 mg 100 mL/hr over 60 Minutes Intravenous Every 8 hours 12/02/20 1707     12/02/20 1800  vancomycin (VANCOCIN) IVPB 1000 mg/200 mL premix  Status:  Discontinued        1,000 mg 200 mL/hr over 60 Minutes Intravenous  Once 12/02/20 1707 12/02/20 1734        Subjective: Patient seen and examined at bedside.  Poor historian.  No overnight seizures, fever, vomiting reported. Objective: Vitals:   12/06/20 1348 12/06/20 1729 12/06/20 2014 12/07/20 0506  BP: 106/71 (!) 108/54 111/68 (!) 105/51  Pulse: 80 84 86 77  Resp: '16 16 16 19  '$ Temp: 98.2 F (36.8 C) 98.1 F (36.7 C) 98.4 F (36.9 C) 98.4 F (36.9 C)  TempSrc: Oral   Oral  SpO2: 100% 92% 100% 100%  Weight:   81 kg   Height:        Intake/Output Summary (Last 24 hours) at 12/07/2020 0758 Last data filed at 12/07/2020 0000 Gross per 24 hour  Intake 603 ml  Output 1500 ml  Net -897 ml    Filed Weights   12/06/20 0943 12/06/20 1251 12/06/20 2014  Weight: 82.3 kg 81 kg 81 kg    Examination:  General exam: Looks chronically ill and deconditioned.  Currently on 2 L oxygen by nasal cannula.  No distress. Respiratory system: Decreased breath sounds at bases bilaterally with some crackles  cardiovascular system: Rate controlled, S1-S2 heard gastrointestinal system: Abdomen is distended, soft and nontender.  Bowel sounds are heard Extremities: Bilateral AKA with dressing present; no clubbing  Central nervous system: Still extremely slow to respond to questions.  More awake today.  No  obvious focal deficits. Skin: No obvious petechiae/other obvious rashes  psychiatry: Cannot assess because of mental status  Data Reviewed: I have personally reviewed following labs and imaging studies  CBC: Recent Labs  Lab 12/02/20 1529 12/02/20 1834 12/02/20 2304 12/04/20 0707 12/06/20 0635  WBC 54.5* 44.2* 59.5* 53.8* 44.9*  NEUTROABS  --  39.5*  --   --   --   HGB 7.5* 6.4* 7.6*  7.6* 7.8* 8.1*  HCT 23.4* 21.5* 24.7*  24.1* 25.2* 25.5*  MCV 75.7* 78.8* 77.7* 75.7* 76.3*  PLT 737* 623* 770* 756* 734*    Basic Metabolic Panel: Recent Labs  Lab 12/01/20 0309 12/02/20 0437 12/02/20 1834 12/03/20 0254 12/04/20 0707 12/18/2020 0404 12/06/20 0635  NA 133* 134* 134* 134* 133* 133* 133*  K 5.1 4.6 4.0 5.4* 5.7* 4.2 4.9  CL 95* 95* 102 95* 92* 96* 99  CO2 22 26 19* '23 25 25 22  '$ GLUCOSE 99 145* 138*  154* 168* 163* 111*  BUN 42* 28* 32* 44* 63* 35* 50*  CREATININE 7.97* 5.87* 5.60* 7.31* 8.83* 5.25* 6.62*  CALCIUM 8.7* 8.4* 6.9* 8.5* 8.6* 8.7* 8.7*  PHOS 6.0* 4.3  --  5.7* 5.7* 3.6  --     GFR: Estimated Creatinine Clearance: 9 mL/min (A) (by C-G formula based on SCr of 6.62 mg/dL (H)). Liver Function Tests: Recent Labs  Lab 12/02/20 0437 12/02/20 1834 12/03/20 0254 12/04/20 0707 11/29/2020 0404  AST  --  24  --   --   --   ALT  --  10  --   --   --   ALKPHOS  --  93  --   --   --   BILITOT  --  0.6  --   --   --   PROT  --  5.5*  --   --   --   ALBUMIN 1.8* 1.4* 1.7* 1.6* 1.9*    No results for input(s): LIPASE, AMYLASE in the last 168 hours. No results for input(s): AMMONIA in the last 168 hours. Coagulation Profile: Recent Labs  Lab 12/02/20 1834  INR 1.5*    Cardiac Enzymes: No results for input(s): CKTOTAL, CKMB, CKMBINDEX, TROPONINI in the last 168 hours. BNP (last 3 results) No results for input(s): PROBNP in the last 8760 hours. HbA1C: No results for input(s): HGBA1C in the last 72 hours. CBG: Recent Labs  Lab 12/06/20 0641 12/06/20 1404  12/06/20 1720 12/06/20 2015 12/07/20 0632  GLUCAP 111* 108* 122* 180* 262*    Lipid Profile: No results for input(s): CHOL, HDL, LDLCALC, TRIG, CHOLHDL, LDLDIRECT in the last 72 hours. Thyroid Function Tests: No results for input(s): TSH, T4TOTAL, FREET4, T3FREE, THYROIDAB in the last 72 hours. Anemia Panel: No results for input(s): VITAMINB12, FOLATE, FERRITIN, TIBC, IRON, RETICCTPCT in the last 72 hours. Sepsis Labs: Recent Labs  Lab 12/02/20 1834 12/02/20 2304  LATICACIDVEN 1.6 1.4     Recent Results (from the past 240 hour(s))  Resp Panel by RT-PCR (Flu A&B, Covid) Nasopharyngeal Swab     Status: None   Collection Time: 11/30/2020 10:26 AM   Specimen: Nasopharyngeal Swab; Nasopharyngeal(NP) swabs in vial transport medium  Result Value Ref Range Status   SARS Coronavirus 2 by RT PCR NEGATIVE NEGATIVE Final    Comment: (NOTE) SARS-CoV-2 target nucleic acids are NOT DETECTED.  The SARS-CoV-2 RNA is generally detectable in upper respiratory specimens during the acute phase of infection. The lowest concentration of SARS-CoV-2 viral copies this assay can detect is 138 copies/mL. A negative result does not preclude SARS-Cov-2 infection and should not be used as the sole basis for treatment or other patient management decisions. A negative result may occur with  improper specimen collection/handling, submission of specimen other than nasopharyngeal swab, presence of viral mutation(s) within the areas targeted by this assay, and inadequate number of viral copies(<138 copies/mL). A negative result must be combined with clinical observations, patient history, and epidemiological information. The expected result is Negative.  Fact Sheet for Patients:  EntrepreneurPulse.com.au  Fact Sheet for Healthcare Providers:  IncredibleEmployment.be  This test is no t yet approved or cleared by the [place] FDA and  has been authorized for  detection and/or diagnosis of SARS-CoV-2 by FDA under an Emergency Use Authorization (EUA). This EUA will remain  in effect (meaning this test can be used) for the duration of the COVID-19 declaration under Section 564(b)(1) of the Act, 21 U.S.C.section 360bbb-3(b)(1), unless the authorization is terminated  or revoked sooner.       Influenza A by PCR NEGATIVE NEGATIVE Final   Influenza B by PCR NEGATIVE NEGATIVE Final    Comment: (NOTE) The Xpert Xpress SARS-CoV-2/FLU/RSV plus assay is intended as an aid in the diagnosis of influenza from Nasopharyngeal swab specimens and should not be used as a sole basis for treatment. Nasal washings and aspirates are unacceptable for Xpert Xpress SARS-CoV-2/FLU/RSV testing.  Fact Sheet for Patients: EntrepreneurPulse.com.au  Fact Sheet for Healthcare Providers: IncredibleEmployment.be  This test is not yet approved or cleared by the [place] FDA and has been authorized for detection and/or diagnosis of SARS-CoV-2 by FDA under an Emergency Use Authorization (EUA). This EUA will remain in effect (meaning this test can be used) for the duration of the COVID-19 declaration under Section 564(b)(1) of the Act, 21 U.S.C. section 360bbb-3(b)(1), unless the authorization is terminated or revoked.  Performed at [facility], [address] [place], [place] [address]   Blood culture (routine x 2)     Status: None   Collection Time: 12/26/2020 10:45 AM   Specimen: BLOOD  Result Value Ref Range Status   Specimen Description BLOOD LEFT ARM  Final   Special Requests   Final    BOTTLES DRAWN AEROBIC AND ANAEROBIC Blood Culture adequate volume   Culture   Final    NO GROWTH 5 DAYS Performed at [facility], [address] [place], [place] [address]    Report Status 12/02/2020 FINAL  Final  Blood culture (routine x 2)     Status: None   Collection Time: 12/14/2020 12:42 PM   Specimen: Left Antecubital; Blood   Result Value Ref Range Status   Specimen Description LEFT ANTECUBITAL  Final   Special Requests   Final    Blood Culture adequate volume BOTTLES DRAWN AEROBIC AND ANAEROBIC   Culture   Final    NO GROWTH 5 DAYS Performed at [facility], [address], [place], [place] [address]    Report Status 12/02/2020 FINAL  Final  MRSA Next Gen by PCR, Nasal     Status: Abnormal   Collection Time: 11/28/20  1:41 AM   Specimen: Nasal Mucosa; Nasal Swab  Result Value Ref Range Status   MRSA by PCR Next Gen DETECTED (A) NOT DETECTED Final    Comment: RESULT CALLED TO, READ BACK BY AND VERIFIED WITH: Starr Sinclair RN, AT [phone] 11/28/20 D. VANHOOK (NOTE) The GeneXpert MRSA Assay (FDA approved for NASAL specimens only), is one component of a comprehensive MRSA colonization surveillance program. It is not intended to diagnose MRSA infection nor to guide or monitor treatment for MRSA infections. Test performance is not FDA approved in patients less than 25 years old. Performed at [facility], [place] [address] [place], [place] [address]   Culture, blood (x 2)     Status: None (Preliminary result)   Collection Time: 12/02/20  6:34 PM   Specimen: BLOOD  Result Value Ref Range Status   Specimen Description BLOOD LEFT ANTECUBITAL  Final   Special Requests AEROBIC BOTTLE ONLY Blood Culture adequate volume  Final   Culture   Final    NO GROWTH 4 DAYS Performed at [facility], [place] [address], [place], [place] [address]    Report Status PENDING  Incomplete  Culture, blood (x 2)     Status: None (Preliminary result)   Collection Time: 12/02/20  6:34 PM   Specimen: BLOOD  Result Value Ref Range Status  Specimen Description BLOOD LEFT ANTECUBITAL  Final   Special Requests IN PEDIATRIC BOTTLE Blood Culture adequate volume  Final   Culture   Final    NO GROWTH 4 DAYS Performed at [facility], [address], [place], [place] [address]    Report Status PENDING  Incomplete  Surgical pcr  screen     Status: Abnormal   Collection Time: 12/17/2020  7:57 AM   Specimen: Nasal Mucosa; Nasal Swab  Result Value Ref Range Status   MRSA, PCR POSITIVE (A) NEGATIVE Final    Comment: RESULT CALLED TO, READ BACK BY AND VERIFIED WITH: RN S CLOTER [phone] AT 52 AM BY CM    Staphylococcus aureus POSITIVE (A) NEGATIVE Final    Comment: (NOTE) The Xpert SA Assay (FDA approved for NASAL specimens in patients 52 years of age and older), is one component of a comprehensive surveillance program. It is not intended to diagnose infection nor to guide or monitor treatment. Performed at [facility], [place] [address], [place], [place] [address]           Radiology Studies: No results found.      Scheduled Meds:  (feeding supplement) PROSource Plus  30 mL Oral BID BM   sodium chloride   Intravenous Once   sodium chloride   Intravenous Once   amiodarone  200 mg Oral Daily   aspirin EC  81 mg Oral QHS   atorvastatin  40 mg Oral QPM   Chlorhexidine Gluconate Cloth  6 each Topical Q0600   Chlorhexidine Gluconate Cloth  6 each Topical Q0600   collagenase   Topical Daily   darbepoetin (ARANESP) injection - DIALYSIS  150 mcg Intravenous Q Mon-HD   doxercalciferol  1.5 mcg Intravenous Q M,W,F-HD   enoxaparin (LOVENOX) injection  80 mg Subcutaneous Q24H   feeding supplement (NEPRO CARB STEADY)  237 mL Oral BID BM   gabapentin  300 mg Oral QHS   hydrocortisone  25 mg Rectal BID   insulin aspart  0-15 Units Subcutaneous TID WC   insulin detemir  16 Units Subcutaneous QHS   midodrine  5 mg Oral TID WC   multivitamin  1 tablet Oral QHS   ondansetron (ZOFRAN) IV  4 mg Intravenous Once   polyethylene glycol  17 g Oral Daily   senna-docusate  1 tablet Oral BID   sevelamer carbonate  2,400 mg Oral TID with meals   sodium chloride flush  3 mL Intravenous Q12H   sodium hypochlorite   Topical Daily   Continuous Infusions:  sodium chloride     sodium chloride     sodium chloride 10  mL/hr at 12/04/20 0300   ceFEPime (MAXIPIME) IV 1 g (12/06/20 1913)   metronidazole 500 mg (12/07/20 0248)   [START ON 12/08/2020] vancomycin            Aline August, MD [facility] 12/07/2020, 7:58 AM

## 2020-12-07 NOTE — Progress Notes (Addendum)
Physical Therapy Wound Treatment Patient Details  Name: Christy Zhang MRN: [id number] Date of Birth: Jul 22, 1955  Today's Date: 12/07/2020 Time:  -     Subjective  Subjective Assessment Subjective: Lethargic but agreeable to hydrotherapy. RN gave oral medication prior to hydro Patient and Family Stated Goals: None stated Date of Onset:  (Unknown) Prior Treatments:  (Dressing changes)  Pain Score:  Pt unable to rate.  Premedicated.  Did moan with transfers and hydro - provided rest breaks.  Comfortable at rest  Wound Assessment  Pressure Injury 11/28/20 Sacrum Unstageable - Full thickness tissue loss in which the base of the injury is covered by slough (yellow, tan, gray, green or brown) and/or eschar (tan, brown or black) in the wound bed. (Active)      Dressing Type Foam - Lift dressing to assess site every shift;Barrier Film (skin prep);Dakin's-soaked gauze 12/07/20 1000  Dressing Clean;Dry;Intact 12/07/20 1000  Dressing Change Frequency Daily 12/07/20 1000  State of Healing Eschar 12/07/20 1000  Site / Wound Assessment Black;Brown;Yellow 12/07/20 1000  % Wound base Red or Granulating 35% 12/07/20 1000  % Wound base Yellow/Fibrinous Exudate 15% 12/07/20 1000  % Wound base Black/Eschar 50% 12/07/20 1000  % Wound base Other/Granulation Tissue (Comment) 0% 12/07/20 1000  Peri-wound Assessment Intact 12/07/20 1000  Wound Length (cm) 13 cm 12/06/20 1400  Wound Width (cm) 9 cm 12/06/20 1400  Wound Depth (cm) 2 cm 12/06/20 1400  Wound Surface Area (cm^2) 117 cm^2 12/06/20 1400  Wound Volume (cm^3) 234 cm^3 12/06/20 1400  Tunneling (cm) 0 12/06/20 1457  Undermining (cm) 2.5 cm at 9:00, 1.9 cm at 6:00 12/06/20 1400  Margins Unattached edges (unapproximated) 12/06/20 1457  Drainage Amount Scant 12/07/20 1000  Drainage Description Purulent;Odor 12/07/20 1000  Treatment Debridement (Selective);Hydrotherapy (Pulse lavage);Other (Comment)Dakin's Gauze 12/07/20 1000     Pressure Injury  11/28/20 Buttocks Left Unstageable - Full thickness tissue loss in which the base of the injury is covered by slough (yellow, tan, gray, green or brown) and/or eschar (tan, brown or black) in the wound bed. (Active)      Dressing Type Barrier Film (skin prep);Dakin's-soaked gauze;Foam - Lift dressing to assess site every shift 12/07/20 1000  Dressing Dry;Clean;Intact 12/07/20 1000  Dressing Change Frequency Daily 12/07/20 1000  State of Healing Eschar 12/07/20 1000  Site / Wound Assessment Brown;Black;Yellow 12/07/20 1000  % Wound base Red or Granulating 5% 12/07/20 1000  % Wound base Yellow/Fibrinous Exudate 25% 12/07/20 1000  % Wound base Black/Eschar 70% 12/07/20 1000  % Wound base Other/Granulation Tissue (Comment) 0% 12/07/20 1000  Peri-wound Assessment Intact 12/07/20 1000  Wound Length (cm) 10.5 cm 12/06/20 1400  Wound Width (cm) 10.2 cm 12/06/20 1400  Wound Depth (cm) 1 cm 12/06/20 1400  Wound Surface Area (cm^2) 107.1 cm^2 12/06/20 1400  Wound Volume (cm^3) 107.1 cm^3 12/06/20 1400  Tunneling (cm) 0 12/06/20 1400  Undermining (cm) 0 12/06/20 1400  Margins Unattached edges (unapproximated) 12/06/20 1457  Drainage Amount Scant 12/07/20 1000  Drainage Description Odor;Purulent 12/07/20 1000  Treatment Debridement (Selective);Hydrotherapy (Pulse lavage);Other (Comment) Dakin's Gauze 12/07/20 1000     Pressure Injury 11/28/20 Flank Right Unstageable - Full thickness tissue loss in which the base of the injury is covered by slough (yellow, tan, gray, green or brown) and/or eschar (tan, brown or black) in the wound bed. (Active)      Dressing Type Barrier Film (skin prep);Foam - Lift dressing to assess site every shift;Dakin's-soaked gauze 12/07/20 1000  Dressing Clean;Dry;Intact 12/07/20 1000  Dressing Change Frequency Daily 12/07/20 1000  State of Healing Eschar 12/07/20 1000  Site / Wound Assessment Brown;Yellow;Black 12/07/20 1000  % Wound base Red or Granulating 10% 12/07/20  1000  % Wound base Yellow/Fibrinous Exudate 60% 12/07/20 1000  % Wound base Black/Eschar 30% 12/07/20 1000  % Wound base Other/Granulation Tissue (Comment) 0% 12/07/20 1000  Peri-wound Assessment Intact 12/07/20 1000  Wound Length (cm) 7 cm 12/06/20 1400  Wound Width (cm) 23 cm 12/06/20 1400  Wound Depth (cm) 1 cm 12/06/20 1400  Wound Surface Area (cm^2) 161 cm^2 12/06/20 1400  Wound Volume (cm^3) 161 cm^3 12/06/20 1400  Tunneling (cm) 0 12/06/20 1457  Undermining (cm) 0 12/06/20 1457  Margins Unattached edges (unapproximated) 12/06/20 1457  Drainage Amount Scant 12/07/20 1000  Drainage Description Purulent;Odor 12/07/20 1000  Treatment Debridement (Selective);Hydrotherapy (Pulse lavage);Other (Comment) Dakin's gauze 12/07/20 1000     Pressure Injury 11/28/20 Hip Right;Lateral Unstageable - Full thickness tissue loss in which the base of the injury is covered by slough (yellow, tan, gray, green or brown) and/or eschar (tan, brown or black) in the wound bed. (Active)      Dressing Type Barrier Film (skin prep);Dakin's-soaked gauze;Foam - Lift dressing to assess site every shift 12/07/20 1000  Dressing Clean;Dry;Intact 12/07/20 1000  Dressing Change Frequency Daily 12/07/20 1000  State of Healing Eschar 12/07/20 1000  Site / Wound Assessment Brown;Yellow;Black 12/07/20 1000  % Wound base Red or Granulating 10% 12/07/20 1000  % Wound base Yellow/Fibrinous Exudate 60% 12/07/20 1000  % Wound base Black/Eschar 30% 12/07/20 1000  % Wound base Other/Granulation Tissue (Comment) 0% 12/07/20 1000  Peri-wound Assessment Intact 12/07/20 1000  Wound Length (cm) 5 cm 12/06/20 1400  Wound Width (cm) 4.5 cm 12/06/20 1400  Wound Depth (cm) 1 cm 12/06/20 1400  Wound Surface Area (cm^2) 22.5 cm^2 12/06/20 1400  Wound Volume (cm^3) 22.5 cm^3 12/06/20 1400  Tunneling (cm) 0 12/06/20 1457  Undermining (cm) 0 12/06/20 1457  Margins Unattached edges (unapproximated) 12/06/20 1457  Drainage Amount Scant  12/07/20 1000  Drainage Description Purulent;Odor 12/07/20 1000  Treatment Debridement (Selective);Hydrotherapy (Pulse lavage);Other (Comment)Dakin's Gauze 12/07/20 1000     Hydrotherapy Pulsed lavage therapy - wound location: Sacrum, R hip, L hip, and R flank Pulsed Lavage with Suction (psi): 8 psi Pulsed Lavage with Suction - Normal Saline Used: 2000 mL Pulsed Lavage Tip: Tip with splash shield Selective Debridement Selective Debridement - Location: Sacrum, R hip, L hip, and R flank Selective Debridement - Tools Used: Forceps, Scalpel, Scissors Selective Debridement - Tissue Removed: Black eschar and yellow unviable tissue    Wound Assessment and Plan  Wound Therapy - Assess/Plan/Recommendations Wound Therapy - Clinical Statement: Spoke with WOC this am who recommend 3 more days of Dakin's solution before Santyl.  Performed hydrotherapy and selective sharp debridement to pt tolerance.  Pt with low tolerance for sidelying and debridement. Will continue to benefit from hydrotherapy for selective removal of unviable tissue, to decrease bioburden, and promote wound bed healing. Wound Therapy - Functional Problem List: Decreased tolerance for position changes and OOB Factors Delaying/Impairing Wound Healing: Diabetes Mellitus, Immobility, Multiple medical problems Hydrotherapy Plan: Debridement, Dressing change, Patient/family education, Pulsatile lavage with suction Wound Therapy - Frequency: 6X / week Wound Therapy - Follow Up Recommendations: dressing changes by RN  Wound Therapy Goals- Improve the function of patient's integumentary system by progressing the wound(s) through the phases of wound healing (inflammation - proliferation - remodeling) by: Wound Therapy Goals - Improve the function of patient's  integumentary system by progressing the wound(s) through the phases of wound healing by: Decrease Necrotic Tissue to: 20% Decrease Necrotic Tissue - Progress: Progressing toward  goal Increase Granulation Tissue to: 80% Increase Granulation Tissue - Progress: Progressing toward goal Goals/treatment plan/discharge plan were made with and agreed upon by patient/family: Yes Time For Goal Achievement: 7 days Wound Therapy - Potential for Goals: Good  Goals will be updated until maximal potential achieved or discharge criteria met.  Discharge criteria: when goals achieved, discharge from hospital, MD decision/surgical intervention, no progress towards goals, refusal/missing three consecutive treatments without notification or medical reason.  GP     PT Wound Care Charges $Wound Debridement up to 20 cm: < or equal to 20 cm $ Wound Debridement each add'l 20 sqcm: 9 $PT PLS Gun and Tip: 1 Supply $PT Hydrotherapy Visit: 1 Visit   Abran Richard, PT Acute Rehab Services Pager [phone] [facility] 12/07/2020, 1:18 PM

## 2020-12-08 DIAGNOSIS — I480 Paroxysmal atrial fibrillation: Secondary | ICD-10-CM | POA: Diagnosis not present

## 2020-12-08 DIAGNOSIS — I96 Gangrene, not elsewhere classified: Secondary | ICD-10-CM | POA: Diagnosis not present

## 2020-12-08 DIAGNOSIS — M79642 Pain in left hand: Secondary | ICD-10-CM

## 2020-12-08 DIAGNOSIS — Z7189 Other specified counseling: Secondary | ICD-10-CM | POA: Diagnosis not present

## 2020-12-08 DIAGNOSIS — N186 End stage renal disease: Secondary | ICD-10-CM | POA: Diagnosis not present

## 2020-12-08 DIAGNOSIS — Z515 Encounter for palliative care: Secondary | ICD-10-CM | POA: Diagnosis not present

## 2020-12-08 DIAGNOSIS — Z89611 Acquired absence of right leg above knee: Secondary | ICD-10-CM | POA: Diagnosis not present

## 2020-12-08 DIAGNOSIS — S31000D Unspecified open wound of lower back and pelvis without penetration into retroperitoneum, subsequent encounter: Secondary | ICD-10-CM

## 2020-12-08 DIAGNOSIS — Z89612 Acquired absence of left leg above knee: Secondary | ICD-10-CM

## 2020-12-08 LAB — CBC WITH DIFFERENTIAL/PLATELET
Abs Immature Granulocytes: 2.37 10*3/uL — ABNORMAL HIGH (ref 0.00–0.07)
Basophils Absolute: 0.2 10*3/uL — ABNORMAL HIGH (ref 0.0–0.1)
Basophils Relative: 0 %
Eosinophils Absolute: 0.3 10*3/uL (ref 0.0–0.5)
Eosinophils Relative: 1 %
HCT: 24.2 % — ABNORMAL LOW (ref 36.0–46.0)
Hemoglobin: 7.8 g/dL — ABNORMAL LOW (ref 12.0–15.0)
Immature Granulocytes: 5 %
Lymphocytes Relative: 5 %
Lymphs Abs: 2.1 10*3/uL (ref 0.7–4.0)
MCH: 25 pg — ABNORMAL LOW (ref 26.0–34.0)
MCHC: 32.2 g/dL (ref 30.0–36.0)
MCV: 77.6 fL — ABNORMAL LOW (ref 80.0–100.0)
Monocytes Absolute: 2 10*3/uL — ABNORMAL HIGH (ref 0.1–1.0)
Monocytes Relative: 4 %
Neutro Abs: 38.7 10*3/uL — ABNORMAL HIGH (ref 1.7–7.7)
Neutrophils Relative %: 85 %
Platelets: 645 10*3/uL — ABNORMAL HIGH (ref 150–400)
RBC: 3.12 MIL/uL — ABNORMAL LOW (ref 3.87–5.11)
RDW: 21.3 % — ABNORMAL HIGH (ref 11.5–15.5)
WBC: 45.6 10*3/uL — ABNORMAL HIGH (ref 4.0–10.5)
nRBC: 0.4 % — ABNORMAL HIGH (ref 0.0–0.2)

## 2020-12-08 LAB — GLUCOSE, CAPILLARY
Glucose-Capillary: 250 mg/dL — ABNORMAL HIGH (ref 70–99)
Glucose-Capillary: 215 mg/dL — ABNORMAL HIGH (ref 70–99)
Glucose-Capillary: 158 mg/dL — ABNORMAL HIGH (ref 70–99)

## 2020-12-08 LAB — APTT: aPTT: 49 seconds — ABNORMAL HIGH (ref 24–36)

## 2020-12-08 MED ORDER — HEPARIN (PORCINE) 25000 UT/250ML-% IV SOLN
1050.0000 [IU]/h | INTRAVENOUS | Status: DC
  Administered 2020-12-08: 1050 [IU]/h via INTRAVENOUS
  Filled 2020-12-08: qty 250

## 2020-12-08 MED ORDER — HYDROMORPHONE HCL 1 MG/ML IJ SOLN
0.5000 mg | Freq: Four times a day (QID) | INTRAMUSCULAR | Status: DC | PRN
  Administered 2020-12-09 – 2020-12-10 (×3): 0.5 mg via INTRAVENOUS
  Filled 2020-12-08 (×3): qty 1

## 2020-12-08 NOTE — Progress Notes (Signed)
This chaplain responded to PMT consult for spiritual care. The chaplain understands the Pt. daughter-[name] requested prayers and presence for her mother.   The chaplain met the Pt. in HD.  The Pt. informs the chaplain "my butt hurts" at the beginning of the visit. The pain continues to be seen by the chaplain through the Pt. facial grimaces.  The chaplain sees a change in the Pt. expression after the Pt. is pulled up in the bed and closes her eyes.   The chaplain offers presence and mindfulness through encouraging the Pt. to visualize the place she is most comfortable. The chaplain learns from the Pt. "home and family."    The chaplain will share intercessory prayer for the Pt. and F/U spiritual care as needed.

## 2020-12-08 NOTE — Progress Notes (Signed)
Daily Progress Note   Patient Name: Christy Zhang       Date: 12/08/2020 DOB: [birth year]  Age: 65 y.o. MRN#: [id number] Attending Physician: Aline August, MD Primary Care Physician: Claretta Fraise, MD Admit Date: 12/15/2020 Length of Stay: 11 days  Reason for Consultation/Follow-up: Establishing goals of care  HPI/Patient Profile:  64 y.o. female  with past medical history of bilateral BKA, ESRD on dialysis on Monday/Wednesday/Friday, type 2 diabetes, hypertension, peripheral vascular disease, hyperlipidemia, recently discharged from here on 11/14/2020 after prolonged hospitalization when she was admitted with altered mental status and had to be intubated for airway protection and also underwent CRRT in the setting of septic shock from pneumonia causing acute respiratory failure with hypoxia, AKI, left radial occlusion s/p surgical revascularization. She was admitted on 12/16/2020 with painful sacral area due to decubitus unable to sit/tolerate for HD and subsequently missed 2 sessions of dialysis.   During admission noted dry gangrene of bilateral BKAs and left hand (from previous radial occlusion). Hand deemed gangrenous and non-salvageable. Only available treatment is bilateral AKA, which [facility] informed the family that would not extend her life. They expressed desire for aggressive measures. General surgery saw the patient for sacral decubitus and does NOT recommend debridement in the OR, rather hydrotherapy/santyl and continued [facility] nursing.  Hand surgeon has folloed and recommended continue monitoring and if hand become more painful or changes to gangrene then surgery will be necessary. Patient's family concerned about the hand due to decreased mobility and increasing pain. Remains on HD, will likely need LTAC due to inability to sit for HD (sacral wounds). Renal mass MRI cancelled due to attempt x 4 with patient refusing.  Overall prognosis felt to be poor. Palliative asked to revisit.  Current  Medications: Scheduled Meds:   (feeding supplement) PROSource Plus  30 mL Oral BID BM   amiodarone  200 mg Oral Daily   aspirin EC  81 mg Oral QHS   atorvastatin  40 mg Oral QPM   Chlorhexidine Gluconate Cloth  6 each Topical Q0600   Chlorhexidine Gluconate Cloth  6 each Topical Q0600   Chlorhexidine Gluconate Cloth  6 each Topical Q0600   collagenase   Topical Daily   darbepoetin (ARANESP) injection - DIALYSIS  150 mcg Intravenous Q Mon-HD   doxercalciferol  1.5 mcg Intravenous Q M,W,F-HD   feeding supplement (NEPRO CARB STEADY)  237 mL Oral BID BM   gabapentin  300 mg Oral QHS   hydrocortisone  25 mg Rectal BID   insulin aspart  0-15 Units Subcutaneous TID WC   insulin detemir  16 Units Subcutaneous QHS   midodrine  5 mg Oral TID WC   multivitamin  1 tablet Oral QHS   mupirocin ointment  1 application Nasal BID   polyethylene glycol  17 g Oral Daily   senna-docusate  1 tablet Oral BID   sevelamer carbonate  2,400 mg Oral TID with meals   sodium chloride flush  3 mL Intravenous Q12H   sodium hypochlorite   Topical BID    Continuous Infusions:  sodium chloride     sodium chloride     sodium chloride 10 mL/hr at 12/04/20 0300    PRN Meds: sodium chloride, sodium chloride, sodium chloride, acetaminophen **OR** acetaminophen, alteplase, diphenhydrAMINE, heparin, heparin, HYDROmorphone (DILAUDID) injection, labetalol, lidocaine (PF), lidocaine-prilocaine, ondansetron **OR** ondansetron (ZOFRAN) IV, oxyCODONE, pentafluoroprop-tetrafluoroeth, phenol, sodium chloride flush  Subjective:   Subjective: Chart Reviewed. Updates received. Patient Assessed. Created space and opportunity for patient  and  family to explore thoughts and feelings regarding current medical situation.  Today's Discussion: The patient was lethargic this morning. Complaining fo back pain and wanting to be pulled up (nursing staff has was notified). Denies dyspnea, hand pain, N/V.  Called and spoke with the  patient's daughter Christy Zhang. We discussed the seriousness of the patient's illness, multiple acute medical problems ongoing. She verbalized understanding and states they are all continuing to pray for her mom to get better. She feels her mom is doing "pretty good" and states she visited with her yesterday. At that time she was verbalizing "butt pain" from the bed sores. She helped feed her. Was informed of leukocytosis and asked "I don't know how it could get that high." We discussed with all her acute care issues it's the body's natural response and not the result of not being on antibiotics, and reinforced how sick her mom is. She states "we know it's a lot, but all we can do is pray."  We revisited goals and she again states she wants FULL CODE and aggressive treatment measures. They understand she'll likely need long term 24 hour facility care at discharge, and they are ok with this.   She did express some concern that her mom isn't being turned frequently enough.  Review of Systems  Respiratory:  Negative for cough and shortness of breath.   Musculoskeletal:  Positive for back pain.       Denies significant hand pain   Objective:   Vital Signs: BP (!) 115/55   Pulse 81   Temp 100.1 F (37.8 C) (Oral)   Resp 16   Ht '5\' 4"'$  (1.626 m)   Wt 82.6 kg   LMP  (LMP Unknown)   SpO2 99%   BMI 31.24 kg/m  SpO2: SpO2: 99 % O2 Device: O2 Device: Nasal Cannula O2 Flow Rate: O2 Flow Rate (L/min): 2 L/min  Physical Exam: Physical Exam Constitutional:      General: She is not in acute distress.    Appearance: She is morbidly obese. She is ill-appearing.  HENT:     Head: Normocephalic and atraumatic.  Cardiovascular:     Rate and Rhythm: Normal rate and regular rhythm.     Heart sounds: No murmur heard. Pulmonary:     Effort: Pulmonary effort is normal. No respiratory distress.     Breath sounds: Normal breath sounds. No wheezing.  Abdominal:     General: Abdomen is protuberant.      Palpations: Abdomen is soft.  Skin:    General: Skin is warm and dry.     Comments: Hand dark/black, see pictured in media tab  Neurological:     Mental Status: She is lethargic.     Comments: Oriented x 2-3 (struggles with the year)    SpO2: SpO2: 99 % O2 Device:SpO2: 99 % O2 Flow Rate: .O2 Flow Rate (L/min): 2 L/min  IO: Intake/output summary:  Intake/Output Summary (Last 24 hours) at 12/08/2020 1224 Last data filed at 12/07/2020 1815 Gross per 24 hour  Intake 598 ml  Output 0 ml  Net 598 ml    LBM: Last BM Date: 12/01/20 Baseline Weight: Weight: 86.2 kg Most recent weight: Weight: 82.6 kg   Palliative Assessment/Data: 30%   Assessment & Plan:   Impression:  65 year old female with recent admission for severe illness as detailed above now admitted with sacral pain due to sacral decubitus unable to tolerate sitting for hemodialysis and subsequently missing 2 sessions.  She is a known severe vasculopath.  She has a history of bilateral BKA's which are now presenting with dry gangrene as well as a gangrenous left hand which is not salvageable, has since had bilateral AKA which surgery has made clear to the patient and her family would not prolong her life.  Hand surgeon has evaluated hand and plan to see how much improvement in marginal area, likely would need amputation/wrist disarticulation as outpatient and need to continue monitoring and sooner intervention if increasing pain/gangrenous changes. Medical team has recommended comfort care.  However, the patient and her family desire aggressive measures at this point.  They requested to change CODE STATUS to full code on admission and remain so.  There is also some concern for possible renal cell carcinoma noted on CT, patient refused MRI when attempted x 4 and since cancelled.   Will likely need LTAC for HD due to inability to sit for HD. Overall poor prognosis.  SUMMARY OF RECOMMENDATIONS   Continue full code, aggressive treatment  per family/patient wishes Turn the patient frequently  Code Status: Full code  Symptom Management: Pain management per primary team Palliative is available to assist as needed  Prognosis: Unable to determine  Discharge Planning: To Be Determined  Discussed with: Patient, daughter, nursing staff, medical staff  Thank you for allowing [place] to participate in the care of CATHE KOLLMANN PMT will continue to support holistically.  Time Total: 75 min  Visit consisted of counseling and education dealing with the complex and emotionally intense issues of symptom management and palliative care in the setting of serious and potentially life-threatening illness. Greater than 50%  of this time was spent counseling and coordinating care related to the above assessment and plan.  Walden Field, NP Palliative Medicine Team  Team Phone # [phone] (Nights/Weekends)  12/08/2020, 12:24 PM

## 2020-12-08 NOTE — Progress Notes (Signed)
Patient ID: Christy Zhang, female   DOB: 05-May-1955, 65 y.o.   MRN: [id number]  [facility] Progress Note   Assessment/ Plan:   1.  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 ESRD:  - Continue HD per MWF schedule  - With her current wounds on the back/inability to sit in recliner, unable to undertake outpatient dialysis and will need LTAC per charting  3. Anemia CKD: Secondary to end-stage renal disease as well as malnutrition-inflammation complex from multiple wounds on her back/limb gangrene.  On aranesp 150 mcg every Monday.  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 Nutrition: Albumin level significantly depressed secondary to multifocal sites of inflammation.    6. Hypotension - on midodrine 5 mg TID (charted as home dose).    7. Leukocytosis - marked leukocytosis. Abx per primary team.  Blood cultures negative.  Work-up per primary team   Disposition - would need LTAC per charting  Subjective:   Seen and examined on dialysis.  Blood pressure 121/50. On HD via RUE AVF.  Tolerating goal.  States uncomfortable in bed   Objective:   BP 132/60 (BP Location: Left Arm)   Pulse 86   Temp 98.6 F (37 C) (Oral)   Resp (!) 22   Ht '5\' 4"'$  (1.626 m)   Wt 82.6 kg   LMP  (LMP Unknown)   SpO2 98%   BMI 31.26 kg/m   Physical Exam:   General adult female in bed in no acute distress HEENT normocephalic atraumatic Neck supple trachea midline Lungs clear to auscultation bilaterally normal work of breathing at rest  Heart S1S2 no rub Abdomen soft nontender nondistended Extremities bilateral AKA; gangrenous changes to left hand  Psych no  agitation Neuro - sleepy again today but answers questions after prodding; provides her name and "dialysis unit at the hospital" Access: RUE AVF in use  Labs: BMET Recent Labs  Lab 12/02/20 0437 12/02/20 1834 12/03/20 0254 12/04/20 0707 12/11/2020 0404 12/06/20 0635 12/07/20 0803  NA 134* 134* 134* 133* 133* 133* 135  K 4.6 4.0 5.4* 5.7* 4.2 4.9 4.6  CL 95* 102 95* 92* 96* 99 98  CO2 26 19* '23 25 25 22 23  '$ GLUCOSE 145* 138* 154* 168* 163* 111* 276*  BUN 28* 32* 44* 63* 35* 50* 41*  CREATININE 5.87* 5.60* 7.31* 8.83* 5.25* 6.62* 5.16*  CALCIUM 8.4* 6.9* 8.5* 8.6* 8.7* 8.7* 8.6*  PHOS 4.3  --  5.7* 5.7* 3.6  --   --    CBC Recent Labs  Lab 12/02/20 1834 12/02/20 2304 12/04/20 0707 12/06/20 0635 12/07/20 0803 12/07/20 2127 12/08/20 0315  WBC 44.2*   < > 53.8* 44.9* 49.1*  --  45.6*  NEUTROABS 39.5*  --   --   --   --   --  38.7*  HGB 6.4*   < > 7.8* 8.1* 7.1* 8.0* 7.8*  HCT 21.5*   < > 25.2* 25.5* 22.9* 25.2* 24.2*  MCV 78.8*   < > 75.7* 76.3* 77.9*  --  77.6*  PLT 623*   < > 756* 734* 705*  --  645*   < > = values  in this interval not displayed.      Medications:     (feeding supplement) PROSource Plus  30 mL Oral BID BM   amiodarone  200 mg Oral Daily   aspirin EC  81 mg Oral QHS   atorvastatin  40 mg Oral QPM   Chlorhexidine Gluconate Cloth  6 each Topical Q0600   Chlorhexidine Gluconate Cloth  6 each Topical Q0600   Chlorhexidine Gluconate Cloth  6 each Topical Q0600   collagenase   Topical Daily   darbepoetin (ARANESP) injection - DIALYSIS  150 mcg Intravenous Q Mon-HD   doxercalciferol  1.5 mcg Intravenous Q M,W,F-HD   feeding supplement (NEPRO CARB STEADY)  237 mL Oral BID BM   gabapentin  300 mg Oral QHS   hydrocortisone  25 mg Rectal BID   insulin aspart  0-15 Units Subcutaneous TID WC   insulin detemir  16 Units Subcutaneous QHS   midodrine  5 mg Oral TID WC   multivitamin  1 tablet Oral QHS   mupirocin ointment  1 application Nasal BID    polyethylene glycol  17 g Oral Daily   senna-docusate  1 tablet Oral BID   sevelamer carbonate  2,400 mg Oral TID with meals   sodium chloride flush  3 mL Intravenous Q12H   sodium hypochlorite   Topical BID   Claudia Desanctis, MD 12/08/2020, 4:02 PM

## 2020-12-08 NOTE — Progress Notes (Signed)
Patient ID: Christy Zhang, female   DOB: 1955/06/01, 65 y.o.   MRN: [id number]  PROGRESS NOTE    Christy Zhang  [id number] DOB: 21-Feb-1956 DOA: 12/04/2020 PCP: Claretta Fraise, MD   Brief Narrative:   78 old female with history of bilateral BKA, ESRD on dialysis,  type 2 diabetes, hypertension, peripheral vascular disease, hyperlipidemia, recently discharged from here on 11/14/2020 after prolonged hospitalization when she was admitted with altered mental status and had to be intubated for airway protection and also underwent CRRT in the setting of septic shock from pneumonia causing acute respiratory failure with hypoxia, AKI.  She presented at [facility] ED because it was too painful to sit down for dialysis due to her decubitus ulcer and she also missed 2 session of dialysis.  Patient was sent to [facility] from [facility] for further management.  Nephrology,orthopedics,palliative care vascular surgery consulted.  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 -Leukocytosis slightly improving.  Antibiotics discontinued on 12/07/2020 after discussion with vascular surgery. -Spoke with Dr. Alberteen Sam surgery on phone on 12/07/2020 who recommended to continue monitoring for now and if hand becomes more painful or changes into her gangrene, patient will need surgical intervention. -Spoke to daughter on phone on 12/07/2020 who was very concerned about her mother's hand because of decreased mobility and increasing pain.  I have passed this information along to Dr. Grandville Silos.  Sacral and right hip decubitus ulcers: Present on admission -Currently undergoing  hydrotherapy as per general surgery recommendations (did not recommend debridement).  Continue Santyl  End-stage renal disease Anemia of chronic disease Hyperkalemia: Resolved -Continue dialysis as per nephrology schedule. -she is unable to sit for dialysis and at this point we are not sure how she will receive outpt dialysis.  Will need LTAC as per nephrology -Hemoglobin currently stable.  Thrombocytosis -Possibly reactive.  Monitor  Mild hyponatremia -Nephrology managing by dialysis  Obesity-outpatient follow-up  Paroxysmal A. Fib -Continue amiodarone.  Eliquis resumed on 12/07/2020 after clearance by vascular surgery.  Left renal mass -MRI was attempted x4 but subsequently canceled  Diabetes mellitus type 2 -Continue Levemir and CBGs with SSI.  A1c 6.4 on 10/27/2020  Generalized deconditioning Goals of care -Overall prognosis is very poor.  Palliative care has evaluated the patient during this hospitalization.  Patient remains full code.   DVT prophylaxis: Lovenox Code Status: Full Family Communication: spoke to daughter on phone on 12/07/2020 Disposition Plan: Status is: Inpatient  Remains inpatient appropriate because:Inpatient level of care appropriate due to severity of illness  Dispo: The patient is from: Home              Anticipated d/c is to: LTAC once bed is available              Patient currently is not medically stable to d/c.   Difficult to place patient No    Consultants: Nephrology/vascular surgery/general surgery  Procedures: Bilateral AKA on 11/28/2020  Antimicrobials:  Anti-infectives (From admission, onward)    Start     Dose/Rate Route Frequency Ordered Stop   12/08/20 1200  vancomycin (VANCOCIN) IVPB 1000 mg/200 mL premix  Status:  Discontinued        1,000 mg 200 mL/hr over 60 Minutes  Intravenous Every M-W-F (Hemodialysis) 12/06/20 1138 12/07/20 1005   12/12/2020 0600  vancomycin (VANCOCIN) IVPB 1000 mg/200 mL premix        1,000 mg 200  mL/hr over 60 Minutes Intravenous To Surgery 12/04/20 1557 12/24/2020 1038   12/04/20 1200  vancomycin (VANCOCIN) IVPB 1000 mg/200 mL premix  Status:  Discontinued        1,000 mg 200 mL/hr over 60 Minutes Intravenous Every M-W-F (Hemodialysis) 12/02/20 1734 12/06/20 1138   12/03/20 1800  ceFEPIme (MAXIPIME) 1 g in sodium chloride 0.9 % 100 mL IVPB  Status:  Discontinued        1 g 200 mL/hr over 30 Minutes Intravenous Every 24 hours 12/02/20 1734 12/07/20 1005   12/02/20 1830  vancomycin (VANCOREADY) IVPB 1750 mg/350 mL        1,750 mg 175 mL/hr over 120 Minutes Intravenous  Once 12/02/20 1734 12/02/20 2156   12/02/20 1800  ceFEPIme (MAXIPIME) 2 g in sodium chloride 0.9 % 100 mL IVPB        2 g 200 mL/hr over 30 Minutes Intravenous  Once 12/02/20 1707 12/02/20 2003   12/02/20 1800  metroNIDAZOLE (FLAGYL) IVPB 500 mg  Status:  Discontinued        500 mg 100 mL/hr over 60 Minutes Intravenous Every 8 hours 12/02/20 1707 12/07/20 1005   12/02/20 1800  vancomycin (VANCOCIN) IVPB 1000 mg/200 mL premix  Status:  Discontinued        1,000 mg 200 mL/hr over 60 Minutes Intravenous  Once 12/02/20 1707 12/02/20 1734        Subjective: Patient seen and examined at bedside.  Poor historian.  No seizures, vomiting or fever reported by nursing staff.   Objective: Vitals:   12/07/20 1820 12/07/20 2111 12/07/20 2246 12/08/20 0542  BP: 120/60 (!) 123/56  (!) 116/52  Pulse: 76 76  75  Resp: '18 14  15  '$ Temp: 98.2 F (36.8 C) 97.8 F (36.6 C)  98.8 F (37.1 C)  TempSrc:  Oral  Oral  SpO2: 99%     Weight:   82.6 kg   Height:        Intake/Output Summary (Last 24 hours) at 12/08/2020 0751 Last data filed at 12/07/2020 1815 Gross per 24 hour  Intake 958 ml  Output 0 ml  Net 958 ml    Filed Weights   12/06/20 1251 12/06/20 2014 12/07/20 2246  Weight: 81 kg 81 kg 82.6 kg    Examination:  General exam: Looks chronically ill and deconditioned.  Still requiring 2 L oxygen by nasal cannula  intermittently.  No acute distress.  Respiratory system: Bilateral decreased breath sounds at bases with scattered crackles.   Cardiovascular system: S1-S2 heard, rate controlled gastrointestinal system: Abdomen is mildly distended, soft and nontender.  Normal bowel sounds heard  extremities: No cyanosis; AKA present bilaterally with dressing Central nervous system: Awake; very slow to respond to questions.  Poor historian.  No obvious focal deficits. Skin: Left hand gangrenous changes present  psychiatry: Could not be assessed because of mental status.  Hardly participates in any conversation  Data Reviewed: I have personally reviewed following labs and imaging studies  CBC: Recent Labs  Lab 12/02/20 1834 12/02/20 2304 12/04/20 0707 12/06/20 0635 12/07/20 0803 12/07/20 2127 12/08/20 0315  WBC 44.2* 59.5* 53.8* 44.9* 49.1*  --  45.6*  NEUTROABS 39.5*  --   --   --   --   --  38.7*  HGB 6.4* 7.6*  7.6* 7.8*  8.1* 7.1* 8.0* 7.8*  HCT 21.5* 24.7*  24.1* 25.2* 25.5* 22.9* 25.2* 24.2*  MCV 78.8* 77.7* 75.7* 76.3* 77.9*  --  77.6*  PLT 623* 770* 756* 734* 705*  --  645*    Basic Metabolic Panel: Recent Labs  Lab 12/02/20 0437 12/02/20 1834 12/03/20 0254 12/04/20 0707 12/12/2020 0404 12/06/20 0635 12/07/20 0803  NA 134*   < > 134* 133* 133* 133* 135  K 4.6   < > 5.4* 5.7* 4.2 4.9 4.6  CL 95*   < > 95* 92* 96* 99 98  CO2 26   < > '23 25 25 22 23  '$ GLUCOSE 145*   < > 154* 168* 163* 111* 276*  BUN 28*   < > 44* 63* 35* 50* 41*  CREATININE 5.87*   < > 7.31* 8.83* 5.25* 6.62* 5.16*  CALCIUM 8.4*   < > 8.5* 8.6* 8.7* 8.7* 8.6*  PHOS 4.3  --  5.7* 5.7* 3.6  --   --    < > = values in this interval not displayed.    GFR: Estimated Creatinine Clearance: 11.6 mL/min (A) (by C-G formula based on SCr of 5.16 mg/dL (H)). Liver Function Tests: Recent Labs  Lab 12/02/20 0437 12/02/20 1834 12/03/20 0254 12/04/20 0707 12/27/2020 0404  AST  --  24  --   --   --   ALT  --  10  --   --    --   ALKPHOS  --  93  --   --   --   BILITOT  --  0.6  --   --   --   PROT  --  5.5*  --   --   --   ALBUMIN 1.8* 1.4* 1.7* 1.6* 1.9*    No results for input(s): LIPASE, AMYLASE in the last 168 hours. No results for input(s): AMMONIA in the last 168 hours. Coagulation Profile: Recent Labs  Lab 12/02/20 1834  INR 1.5*    Cardiac Enzymes: No results for input(s): CKTOTAL, CKMB, CKMBINDEX, TROPONINI in the last 168 hours. BNP (last 3 results) No results for input(s): PROBNP in the last 8760 hours. HbA1C: No results for input(s): HGBA1C in the last 72 hours. CBG: Recent Labs  Lab 12/07/20 0632 12/07/20 1148 12/07/20 1624 12/07/20 2106 12/08/20 0645  GLUCAP 262* 225* 213* 230* 250*    Lipid Profile: No results for input(s): CHOL, HDL, LDLCALC, TRIG, CHOLHDL, LDLDIRECT in the last 72 hours. Thyroid Function Tests: No results for input(s): TSH, T4TOTAL, FREET4, T3FREE, THYROIDAB in the last 72 hours. Anemia Panel: No results for input(s): VITAMINB12, FOLATE, FERRITIN, TIBC, IRON, RETICCTPCT in the last 72 hours. Sepsis Labs: Recent Labs  Lab 12/02/20 1834 12/02/20 2304  LATICACIDVEN 1.6 1.4     Recent Results (from the past 240 hour(s))  Culture, blood (x 2)     Status: None   Collection Time: 12/02/20  6:34 PM   Specimen: BLOOD  Result Value Ref Range Status   Specimen Description BLOOD LEFT ANTECUBITAL  Final   Special Requests AEROBIC BOTTLE ONLY Blood Culture adequate volume  Final   Culture   Final    NO GROWTH 5 DAYS Performed at [facility], [address], [place], [place] [address]    Report Status 12/07/2020 FINAL  Final  Culture, blood (x 2)     Status: None   Collection Time: 12/02/20  6:34 PM   Specimen: BLOOD  Result Value Ref Range Status   Specimen  Description BLOOD LEFT ANTECUBITAL  Final   Special Requests IN PEDIATRIC BOTTLE Blood Culture adequate volume  Final   Culture   Final    NO GROWTH 5 DAYS Performed at [facility], [place] [address], [place], [place] [address]    Report Status 12/07/2020 FINAL  Final  Surgical pcr screen     Status: Abnormal   Collection Time: 11/30/2020  7:57 AM   Specimen: Nasal Mucosa; Nasal Swab  Result Value Ref Range Status   MRSA, PCR POSITIVE (A) NEGATIVE Final    Comment: RESULT CALLED TO, READ BACK BY AND VERIFIED WITH: RN S CLOTER [id number] AT 108 AM BY CM    Staphylococcus aureus POSITIVE (A) NEGATIVE Final    Comment: (NOTE) The Xpert SA Assay (FDA approved for NASAL specimens in patients 57 years of age and older), is one component of a comprehensive surveillance program. It is not intended to diagnose infection nor to guide or monitor treatment. Performed at [facility], [place] [address], [place],  [address]           Radiology Studies: No results found.      Scheduled Meds:  (feeding supplement) PROSource Plus  30 mL Oral BID BM   amiodarone  200 mg Oral Daily   apixaban  5 mg Oral BID   aspirin EC  81 mg Oral QHS   atorvastatin  40 mg Oral QPM   Chlorhexidine Gluconate Cloth  6 each Topical Q0600   Chlorhexidine Gluconate Cloth  6 each Topical Q0600   Chlorhexidine Gluconate Cloth  6 each Topical Q0600   collagenase   Topical Daily   darbepoetin (ARANESP) injection - DIALYSIS  150 mcg Intravenous Q Mon-HD   doxercalciferol  1.5 mcg Intravenous Q M,W,F-HD   feeding supplement (NEPRO CARB STEADY)  237 mL Oral BID BM   gabapentin  300 mg Oral QHS   hydrocortisone  25 mg Rectal BID   insulin aspart  0-15 Units Subcutaneous TID WC   insulin detemir  16 Units Subcutaneous QHS   midodrine  5 mg Oral TID WC   multivitamin  1 tablet Oral QHS   mupirocin ointment  1 application Nasal BID   polyethylene glycol  17 g Oral Daily   senna-docusate  1 tablet Oral BID   sevelamer carbonate  2,400 mg Oral TID with meals   sodium chloride flush  3 mL Intravenous Q12H   sodium hypochlorite   Topical BID   Continuous Infusions:  sodium chloride      sodium chloride     sodium chloride 10 mL/hr at 12/04/20 0300          Aline August, MD [facility] 12/08/2020, 7:51 AM

## 2020-12-08 NOTE — Progress Notes (Signed)
ANTICOAGULATION CONSULT NOTE - Follow Up Consult  Pharmacy Consult for Heparin (while Apixaban on hold) Indication: atrial fibrillation  Allergies  Allergen Reactions   Sulfa Antibiotics Swelling    Patient Measurements: Height: '5\' 4"'$  (162.6 cm) Weight: 82.6 kg (182 lb) IBW/kg (Calculated) : 54.7 Heparin Dosing Weight: 75 kg  Vital Signs: Temp: 100.1 F (37.8 C) (08/12 1019) Temp Source: Oral (08/12 1019) BP: 115/55 (08/12 1019) Pulse Rate: 81 (08/12 1019)  Labs: Recent Labs    12/06/20 0635 12/07/20 0803 12/07/20 2127 12/08/20 0315  HGB 8.1* 7.1* 8.0* 7.8*  HCT 25.5* 22.9* 25.2* 24.2*  PLT 734* 705*  --  645*  CREATININE 6.62* 5.16*  --   --     Estimated Creatinine Clearance: 11.6 mL/min (A) (by C-G formula based on SCr of 5.16 mg/dL (H)).   Medications:  Infusions:   sodium chloride     sodium chloride     sodium chloride 10 mL/hr at 12/04/20 0300    Assessment: 12 YOF on Apixaban PTA for hx Afib - held and bridged with Lovenox 8/1 - 8/10 , s/p revision of B/L BKA to AKA on 8/9 by VVS. Also noted to have L-hand gangrene with amputation recommended but family has not been agreeable. Given increased pain - now being re-considered. Pharmacy consulted to bridge with Heparin while Apixaban on hold.   Lovenox use in ESRD can accumulate - making Heparin a safer option to utilize. Last Lovenox dose was 8/10 AM - will check a baseline aPTT prior to starting Heparin. Last Apix dose 8/12 AM - will cause the heparin level to falsely elevated for a period of time.  Goal of Therapy:  Heparin level 0.3-0.7 units/ml Monitor platelets by anticoagulation protocol: Yes   Plan:  - Start Heparin at a rate of 1050 units/hr starting at 2200 today - Baseline aPTT, daily aPTT/HL/CBC - Will continue to monitor for any signs/symptoms of bleeding and will follow up with aPTT level in 8 hours from initiation  Thank you for allowing pharmacy to be a part of this patient's  care.  Alycia Rossetti, PharmD, BCPS Clinical Pharmacist Clinical phone for 12/08/2020: [phone] 12/08/2020 10:36 AM   **Pharmacist phone directory can now be found on [email or web address] ([email or web address]).  Listed under [place].

## 2020-12-08 NOTE — Progress Notes (Signed)
Physical Therapy Wound Treatment Patient Details  Name: Christy Zhang MRN: [id number] Date of Birth: 1955/05/21  Today's Date: 12/08/2020 Time: 8756-4332 Time Calculation (min): 64 min  Subjective  Subjective Assessment Subjective: Lethargic but agreeable to hydrotherapy. Additional pain meds unable to be given due to lethargy. Patient and Family Stated Goals: None stated Date of Onset:  (Unknown) Prior Treatments:  (Dressing changes)  Pain Score:  Pt tolerated treatment well with minimal complaints of pain.   Wound Assessment  Pressure Injury 11/28/20 Sacrum Unstageable - Full thickness tissue loss in which the base of the injury is covered by slough (yellow, tan, gray, green or brown) and/or eschar (tan, brown or black) in the wound bed. (Active)  Dressing Type Barrier Film (skin prep);Dakin's-soaked gauze;Foam - Lift dressing to assess site every shift;Gauze (Comment);Moist to moist 12/08/20 1445  Dressing Changed;Clean;Dry;Intact 12/08/20 1445  Dressing Change Frequency Daily 12/08/20 1445  State of Healing Eschar 12/08/20 1445  Site / Wound Assessment Black;Yellow;Pink 12/08/20 1445  % Wound base Red or Granulating 35% 12/08/20 1445  % Wound base Yellow/Fibrinous Exudate 15% 12/08/20 1445  % Wound base Black/Eschar 50% 12/08/20 1445  % Wound base Other/Granulation Tissue (Comment) 0% 12/08/20 1445  Peri-wound Assessment Intact 12/08/20 1445  Wound Length (cm) 13 cm 12/06/20 1400  Wound Width (cm) 9 cm 12/06/20 1400  Wound Depth (cm) 2 cm 12/06/20 1400  Wound Surface Area (cm^2) 117 cm^2 12/06/20 1400  Wound Volume (cm^3) 234 cm^3 12/06/20 1400  Tunneling (cm) 0 12/06/20 1457  Undermining (cm) 2.5 cm at 9:00, 1.9 cm at 6:00 12/06/20 1400  Margins Unattached edges (unapproximated) 12/08/20 1445  Drainage Amount Minimal 12/08/20 1445  Drainage Description Purulent 12/08/20 1445  Treatment Debridement (Selective);Hydrotherapy (Pulse lavage);Packing (Saline gauze) 12/08/20  1445     Pressure Injury 11/28/20 Buttocks Left Unstageable - Full thickness tissue loss in which the base of the injury is covered by slough (yellow, tan, gray, green or brown) and/or eschar (tan, brown or black) in the wound bed. (Active)  Dressing Type Barrier Film (skin prep);Dakin's-soaked gauze;Foam - Lift dressing to assess site every shift;Gauze (Comment);Moist to moist 12/08/20 1445  Dressing Changed;Clean;Dry;Intact 12/08/20 1445  Dressing Change Frequency Daily 12/08/20 1445  State of Healing Eschar 12/08/20 1445  Site / Wound Assessment Black;Yellow;Pink 12/08/20 1445  % Wound base Red or Granulating 5% 12/08/20 1445  % Wound base Yellow/Fibrinous Exudate 25% 12/08/20 1445  % Wound base Black/Eschar 70% 12/08/20 1445  % Wound base Other/Granulation Tissue (Comment) 0% 12/08/20 1445  Peri-wound Assessment Intact 12/08/20 1445  Wound Length (cm) 10.5 cm 12/06/20 1400  Wound Width (cm) 10.2 cm 12/06/20 1400  Wound Depth (cm) 1 cm 12/06/20 1400  Wound Surface Area (cm^2) 107.1 cm^2 12/06/20 1400  Wound Volume (cm^3) 107.1 cm^3 12/06/20 1400  Tunneling (cm) 0 12/06/20 1400  Undermining (cm) 0 12/06/20 1400  Margins Unattached edges (unapproximated) 12/08/20 1445  Drainage Amount Minimal 12/08/20 1445  Drainage Description Odor;Purulent 12/08/20 1445  Treatment Debridement (Selective);Hydrotherapy (Pulse lavage);Packing (Saline gauze) 12/08/20 1445     Pressure Injury 11/28/20 Flank Right Unstageable - Full thickness tissue loss in which the base of the injury is covered by slough (yellow, tan, gray, green or brown) and/or eschar (tan, brown or black) in the wound bed. (Active)  Dressing Type Barrier Film (skin prep);Dakin's-soaked gauze;Foam - Lift dressing to assess site every shift;Gauze (Comment);Moist to moist 12/08/20 1445  Dressing Changed;Clean;Dry;Intact 12/08/20 1445  Dressing Change Frequency Daily 12/08/20 1445  State of Healing Eschar  12/08/20 1445  Site / Wound  Assessment Brown;Yellow;Black;Pink 12/08/20 1445  % Wound base Red or Granulating 10% 12/08/20 1445  % Wound base Yellow/Fibrinous Exudate 60% 12/08/20 1445  % Wound base Black/Eschar 30% 12/08/20 1445  % Wound base Other/Granulation Tissue (Comment) 0% 12/08/20 1445  Peri-wound Assessment Intact 12/08/20 1445  Wound Length (cm) 7 cm 12/06/20 1400  Wound Width (cm) 23 cm 12/06/20 1400  Wound Depth (cm) 1 cm 12/06/20 1400  Wound Surface Area (cm^2) 161 cm^2 12/06/20 1400  Wound Volume (cm^3) 161 cm^3 12/06/20 1400  Tunneling (cm) 0 12/06/20 1457  Undermining (cm) 0 12/06/20 1457  Margins Unattached edges (unapproximated) 12/08/20 1445  Drainage Amount Minimal 12/08/20 1445  Drainage Description Serosanguineous;Odor 12/08/20 1445  Treatment Debridement (Selective);Hydrotherapy (Pulse lavage);Packing (Saline gauze) 12/08/20 1445     Pressure Injury 11/28/20 Hip Right;Lateral Unstageable - Full thickness tissue loss in which the base of the injury is covered by slough (yellow, tan, gray, green or brown) and/or eschar (tan, brown or black) in the wound bed. (Active)  Dressing Type Barrier Film (skin prep);Dakin's-soaked gauze;Foam - Lift dressing to assess site every shift;Gauze (Comment);Moist to moist 12/08/20 1445  Dressing Changed;Clean;Dry;Intact 12/08/20 1445  Dressing Change Frequency Daily 12/08/20 1445  State of Healing Eschar 12/08/20 1445  Site / Wound Assessment Pink;Yellow;Black 12/08/20 1445  % Wound base Red or Granulating 10% 12/08/20 1445  % Wound base Yellow/Fibrinous Exudate 80% 12/08/20 1445  % Wound base Black/Eschar 10% 12/08/20 1445  % Wound base Other/Granulation Tissue (Comment) 0% 12/08/20 1445  Peri-wound Assessment Intact 12/08/20 1445  Wound Length (cm) 5 cm 12/06/20 1400  Wound Width (cm) 4.5 cm 12/06/20 1400  Wound Depth (cm) 1 cm 12/06/20 1400  Wound Surface Area (cm^2) 22.5 cm^2 12/06/20 1400  Wound Volume (cm^3) 22.5 cm^3 12/06/20 1400  Tunneling (cm) 0  12/06/20 1457  Undermining (cm) 0 12/06/20 1457  Margins Unattached edges (unapproximated) 12/08/20 1445  Drainage Amount Scant 12/08/20 1445  Drainage Description Serosanguineous 12/08/20 1445  Treatment Debridement (Selective);Hydrotherapy (Pulse lavage);Packing (Saline gauze) 12/08/20 1445      Hydrotherapy Pulsed lavage therapy - wound location: Sacrum, R hip, L hip, and R flank Pulsed Lavage with Suction (psi): 8 psi Pulsed Lavage with Suction - Normal Saline Used: 2000 mL Pulsed Lavage Tip: Tip with splash shield Selective Debridement Selective Debridement - Location: Sacrum, R hip, L hip, and R flank Selective Debridement - Tools Used: Forceps, Scalpel, Scissors Selective Debridement - Tissue Removed: Black eschar and yellow unviable tissue    Wound Assessment and Plan  Wound Therapy - Assess/Plan/Recommendations Wound Therapy - Clinical Statement: Pt was unable to received premedication due to lethargy however pt tolerated treatment with minimal complaints of pain. She slept through most of the session. Wounds continue to appear dry with large amounts of necrotic tissue able to be debrided. Will continue to benefit from hydrotherapy for selective removal of unviable tissue, to decrease bioburden, and promote wound bed healing. Wound Therapy - Functional Problem List: Decreased tolerance for position changes and OOB Factors Delaying/Impairing Wound Healing: Diabetes Mellitus, Immobility, Multiple medical problems Hydrotherapy Plan: Debridement, Dressing change, Patient/family education, Pulsatile lavage with suction Wound Therapy - Frequency: 6X / week Wound Therapy - Follow Up Recommendations: dressing changes by RN  Wound Therapy Goals- Improve the function of patient's integumentary system by progressing the wound(s) through the phases of wound healing (inflammation - proliferation - remodeling) by: Wound Therapy Goals - Improve the function of patient's integumentary system  by progressing the  wound(s) through the phases of wound healing by: Decrease Necrotic Tissue to: 20% Decrease Necrotic Tissue - Progress: Progressing toward goal Increase Granulation Tissue to: 80% Increase Granulation Tissue - Progress: Progressing toward goal Goals/treatment plan/discharge plan were made with and agreed upon by patient/family: Yes Time For Goal Achievement: 7 days Wound Therapy - Potential for Goals: Good  Goals will be updated until maximal potential achieved or discharge criteria met.  Discharge criteria: when goals achieved, discharge from hospital, MD decision/surgical intervention, no progress towards goals, refusal/missing three consecutive treatments without notification or medical reason.  GP     Charges PT Wound Care Charges $Wound Debridement up to 20 cm: < or equal to 20 cm $ Wound Debridement each add'l 20 sqcm: 9 $PT PLS Gun and Tip: 1 Supply $PT Hydrotherapy Visit: 1 Visit       Thelma Comp 12/08/2020, 3:11 PM  Rolinda Roan, PT, DPT Acute Rehabilitation Services Pager: [phone] Office: [phone]

## 2020-12-09 DIAGNOSIS — N186 End stage renal disease: Secondary | ICD-10-CM | POA: Diagnosis not present

## 2020-12-09 DIAGNOSIS — I96 Gangrene, not elsewhere classified: Secondary | ICD-10-CM | POA: Diagnosis not present

## 2020-12-09 DIAGNOSIS — I480 Paroxysmal atrial fibrillation: Secondary | ICD-10-CM | POA: Diagnosis not present

## 2020-12-09 DIAGNOSIS — Z89611 Acquired absence of right leg above knee: Secondary | ICD-10-CM | POA: Diagnosis not present

## 2020-12-09 LAB — GLUCOSE, CAPILLARY
Glucose-Capillary: 215 mg/dL — ABNORMAL HIGH (ref 70–99)
Glucose-Capillary: 216 mg/dL — ABNORMAL HIGH (ref 70–99)
Glucose-Capillary: 227 mg/dL — ABNORMAL HIGH (ref 70–99)
Glucose-Capillary: 247 mg/dL — ABNORMAL HIGH (ref 70–99)
Glucose-Capillary: 194 mg/dL — ABNORMAL HIGH (ref 70–99)

## 2020-12-09 LAB — BASIC METABOLIC PANEL
Anion gap: 10 (ref 5–15)
BUN: 42 mg/dL — ABNORMAL HIGH (ref 8–23)
CO2: 26 mmol/L (ref 22–32)
Calcium: 8.3 mg/dL — ABNORMAL LOW (ref 8.9–10.3)
Chloride: 98 mmol/L (ref 98–111)
Creatinine, Ser: 4.31 mg/dL — ABNORMAL HIGH (ref 0.44–1.00)
GFR, Estimated: 11 mL/min — ABNORMAL LOW (ref >60)
Glucose, Bld: 226 mg/dL — ABNORMAL HIGH (ref 70–99)
Potassium: 4.2 mmol/L (ref 3.5–5.1)
Sodium: 134 mmol/L — ABNORMAL LOW (ref 135–145)

## 2020-12-09 LAB — CBC WITH DIFFERENTIAL/PLATELET
Abs Immature Granulocytes: 0 10*3/uL (ref 0.00–0.07)
Basophils Absolute: 0 10*3/uL (ref 0.0–0.1)
Basophils Relative: 0 %
Eosinophils Absolute: 0.4 10*3/uL (ref 0.0–0.5)
Eosinophils Relative: 1 %
HCT: 21.9 % — ABNORMAL LOW (ref 36.0–46.0)
Hemoglobin: 7 g/dL — ABNORMAL LOW (ref 12.0–15.0)
Lymphocytes Relative: 3 %
Lymphs Abs: 1.3 10*3/uL (ref 0.7–4.0)
MCH: 25.2 pg — ABNORMAL LOW (ref 26.0–34.0)
MCHC: 32 g/dL (ref 30.0–36.0)
MCV: 78.8 fL — ABNORMAL LOW (ref 80.0–100.0)
Monocytes Absolute: 3 10*3/uL — ABNORMAL HIGH (ref 0.1–1.0)
Monocytes Relative: 7 %
Neutro Abs: 37.7 10*3/uL — ABNORMAL HIGH (ref 1.7–7.7)
Neutrophils Relative %: 89 %
Platelets: 634 10*3/uL — ABNORMAL HIGH (ref 150–400)
RBC: 2.78 MIL/uL — ABNORMAL LOW (ref 3.87–5.11)
RDW: 22.4 % — ABNORMAL HIGH (ref 11.5–15.5)
WBC: 42.4 10*3/uL — ABNORMAL HIGH (ref 4.0–10.5)
nRBC: 1 /100 WBC — ABNORMAL HIGH
nRBC: 1.1 % — ABNORMAL HIGH (ref 0.0–0.2)

## 2020-12-09 LAB — APTT: aPTT: 94 seconds — ABNORMAL HIGH (ref 24–36)

## 2020-12-09 LAB — HEMOGLOBIN AND HEMATOCRIT, BLOOD
HCT: 24.5 % — ABNORMAL LOW (ref 36.0–46.0)
Hemoglobin: 7.7 g/dL — ABNORMAL LOW (ref 12.0–15.0)

## 2020-12-09 LAB — PREPARE RBC (CROSSMATCH)

## 2020-12-09 LAB — HEPARIN LEVEL (UNFRACTIONATED): Heparin Unfractionated: >1.1 IU/mL — ABNORMAL HIGH (ref 0.30–0.70)

## 2020-12-09 MED ORDER — SODIUM CHLORIDE 0.9% IV SOLUTION: Freq: Once | INTRAVENOUS | Status: AC

## 2020-12-09 NOTE — Progress Notes (Signed)
Patient received 1 unit of RBC.

## 2020-12-09 NOTE — Progress Notes (Signed)
Patient ID: Christy Zhang, female   DOB: [birth year], 65 y.o.   MRN: [id number]  PROGRESS NOTE    Christy Zhang  [name] DOB: Apr 01, 1956 DOA: 12/03/2020 PCP: Claretta Fraise, MD   Brief Narrative:   66 old female with history of bilateral BKA, ESRD on dialysis,  type 2 diabetes, hypertension, peripheral vascular disease, hyperlipidemia, recently discharged from here on 11/14/2020 after prolonged hospitalization when she was admitted with altered mental status and had to be intubated for airway protection and also underwent CRRT in the setting of septic shock from pneumonia causing acute respiratory failure with hypoxia, AKI.  She presented at [facility] hospital ED because it was too painful to sit down for dialysis due to her decubitus ulcer and she also missed 2 session of dialysis.  Patient was sent to [facility] from [facility] for further management.  Nephrology,orthopedics,palliative care vascular surgery consulted.  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 -WBCs pending for today.  Antibiotics discontinued on 12/07/2020 after discussion with vascular surgery. -Spoke with Dr. Alberteen Sam surgery on phone on 12/07/2020 who recommended to continue monitoring for now and if hand becomes more painful or changes into her gangrene, patient will need surgical intervention. -Spoke to daughter on phone on 12/07/2020 who was very concerned about her mother's hand because of decreased mobility and increasing pain. Dr. Grandville Silos spoke to the patient's daughter on 12/08/2020 on phone.  Sacral and right hip decubitus ulcers: Present on admission -Currently  undergoing hydrotherapy as per general surgery recommendations (did not recommend debridement).  Continue Santyl  End-stage renal disease Anemia of chronic disease Hyperkalemia: Resolved -Continue dialysis as per nephrology schedule. -she is unable to sit for dialysis and at this point we are not sure how she will receive outpt dialysis.  Will need LTAC as per nephrology -Hemoglobin currently stable.  Thrombocytosis -Possibly reactive.  Monitor  Mild hyponatremia -Nephrology managing by dialysis  Obesity-outpatient follow-up  Paroxysmal A. Fib -Continue amiodarone.  Currently on heparin since there was a question regarding possibility of patient undergoing hand surgery.    Left renal mass -MRI was attempted x4 but subsequently canceled  Diabetes mellitus type 2 -Continue Levemir and CBGs with SSI.  A1c 6.4 on 10/27/2020  Generalized deconditioning Goals of care -Overall prognosis is very poor.  Palliative care has evaluated the patient during this hospitalization.  Patient remains full code.   DVT prophylaxis: Lovenox Code Status: Full Family Communication: spoke to daughter on phone on 12/07/2020 Disposition Plan: Status is: Inpatient  Remains inpatient appropriate because:Inpatient level of care appropriate due to severity of illness  Dispo: The patient is from: Home              Anticipated d/c is to: LTAC once bed is available              Patient currently is not medically stable to d/c.   Difficult to place patient No    Consultants: Nephrology/vascular surgery/general surgery  Procedures: Bilateral AKA on 12/03/2020  Antimicrobials:  Anti-infectives (From admission, onward)    Start     Dose/Rate Route Frequency Ordered Stop   12/08/20 1200  vancomycin (VANCOCIN) IVPB 1000 mg/200 mL premix  Status:  Discontinued  1,000 mg 200 mL/hr over 60 Minutes Intravenous Every M-W-F (Hemodialysis) 12/06/20 1138 12/07/20 1005   12/25/2020 0600  vancomycin (VANCOCIN)  IVPB 1000 mg/200 mL premix        1,000 mg 200 mL/hr over 60 Minutes Intravenous To Surgery 12/04/20 1557 12/11/2020 1038   12/04/20 1200  vancomycin (VANCOCIN) IVPB 1000 mg/200 mL premix  Status:  Discontinued        1,000 mg 200 mL/hr over 60 Minutes Intravenous Every M-W-F (Hemodialysis) 12/02/20 1734 12/06/20 1138   12/03/20 1800  ceFEPIme (MAXIPIME) 1 g in sodium chloride 0.9 % 100 mL IVPB  Status:  Discontinued        1 g 200 mL/hr over 30 Minutes Intravenous Every 24 hours 12/02/20 1734 12/07/20 1005   12/02/20 1830  vancomycin (VANCOREADY) IVPB 1750 mg/350 mL        1,750 mg 175 mL/hr over 120 Minutes Intravenous  Once 12/02/20 1734 12/02/20 2156   12/02/20 1800  ceFEPIme (MAXIPIME) 2 g in sodium chloride 0.9 % 100 mL IVPB        2 g 200 mL/hr over 30 Minutes Intravenous  Once 12/02/20 1707 12/02/20 2003   12/02/20 1800  metroNIDAZOLE (FLAGYL) IVPB 500 mg  Status:  Discontinued        500 mg 100 mL/hr over 60 Minutes Intravenous Every 8 hours 12/02/20 1707 12/07/20 1005   12/02/20 1800  vancomycin (VANCOCIN) IVPB 1000 mg/200 mL premix  Status:  Discontinued        1,000 mg 200 mL/hr over 60 Minutes Intravenous  Once 12/02/20 1707 12/02/20 1734        Subjective: Patient seen and examined at bedside.  Poor historian.  No overnight fever, vomiting, seizures reported by nursing staff.    Objective: Vitals:   12/08/20 1800 12/08/20 1840 12/08/20 2010 12/09/20 0525  BP: (!) 100/49 (!) 112/50 (!) 121/52 (!) 113/50  Pulse: 87 88 86 94  Resp: '19  18 16  '$ Temp:  100.2 F (37.9 C) 98.8 F (37.1 C) 98.8 F (37.1 C)  TempSrc:  Axillary Oral Oral  SpO2:  98% 96% 96%  Weight:      Height:        Intake/Output Summary (Last 24 hours) at 12/09/2020 0743 Last data filed at 12/09/2020 0600 Gross per 24 hour  Intake 540.38 ml  Output 746 ml  Net -205.62 ml    Filed Weights   12/06/20 2014 12/07/20 2246 12/08/20 1500  Weight: 81 kg 82.6 kg 82.6 kg    Examination:  General  exam: Looks chronically ill and deconditioned.  No distress.  On 1 to 2 L oxygen via nasal cannula  respiratory system: Decreased breath sounds at bases bilaterally with some crackles  cardiovascular system: Rate controlled, S1-S2 heard gastrointestinal system: Abdomen is distended slightly, soft and nontender.  Bowel sounds are heard  extremities: Bilateral AKA with dressing present  Central nervous system: Extremely slow to respond to questions; wakes up slightly.  Poor historian.  No obvious focal neurologic deficits skin: Left hand gangrenous changes present  psychiatry: Extremely flat affect; hardly participates in conversation  Data Reviewed: I have personally reviewed following labs and imaging studies  CBC: Recent Labs  Lab 12/02/20 1834 12/02/20 2304 12/04/20 0707 12/06/20 0635 12/07/20 0803 12/07/20 2127 12/08/20 0315  WBC 44.2* 59.5* 53.8* 44.9* 49.1*  --  45.6*  NEUTROABS 39.5*  --   --   --   --   --  38.7*  HGB 6.4* 7.6*  7.6* 7.8* 8.1* 7.1* 8.0* 7.8*  HCT 21.5* 24.7*  24.1* 25.2* 25.5* 22.9* 25.2* 24.2*  MCV 78.8* 77.7* 75.7* 76.3* 77.9*  --  77.6*  PLT 623* 770* 756* 734* 705*  --  645*    Basic Metabolic Panel: Recent Labs  Lab 12/03/20 0254 12/04/20 0707 12/27/2020 0404 12/06/20 0635 12/07/20 0803  NA 134* 133* 133* 133* 135  K 5.4* 5.7* 4.2 4.9 4.6  CL 95* 92* 96* 99 98  CO2 '23 25 25 22 23  '$ GLUCOSE 154* 168* 163* 111* 276*  BUN 44* 63* 35* 50* 41*  CREATININE 7.31* 8.83* 5.25* 6.62* 5.16*  CALCIUM 8.5* 8.6* 8.7* 8.7* 8.6*  PHOS 5.7* 5.7* 3.6  --   --     GFR: Estimated Creatinine Clearance: 11.6 mL/min (A) (by C-G formula based on SCr of 5.16 mg/dL (H)). Liver Function Tests: Recent Labs  Lab 12/02/20 1834 12/03/20 0254 12/04/20 0707 12/15/2020 0404  AST 24  --   --   --   ALT 10  --   --   --   ALKPHOS 93  --   --   --   BILITOT 0.6  --   --   --   PROT 5.5*  --   --   --   ALBUMIN 1.4* 1.7* 1.6* 1.9*    No results for input(s): LIPASE,  AMYLASE in the last 168 hours. No results for input(s): AMMONIA in the last 168 hours. Coagulation Profile: Recent Labs  Lab 12/02/20 1834  INR 1.5*    Cardiac Enzymes: No results for input(s): CKTOTAL, CKMB, CKMBINDEX, TROPONINI in the last 168 hours. BNP (last 3 results) No results for input(s): PROBNP in the last 8760 hours. HbA1C: No results for input(s): HGBA1C in the last 72 hours. CBG: Recent Labs  Lab 12/07/20 2106 12/08/20 0645 12/08/20 1201 12/08/20 2013 12/09/20 0637  GLUCAP 230* 250* 215* 158* 215*    Lipid Profile: No results for input(s): CHOL, HDL, LDLCALC, TRIG, CHOLHDL, LDLDIRECT in the last 72 hours. Thyroid Function Tests: No results for input(s): TSH, T4TOTAL, FREET4, T3FREE, THYROIDAB in the last 72 hours. Anemia Panel: No results for input(s): VITAMINB12, FOLATE, FERRITIN, TIBC, IRON, RETICCTPCT in the last 72 hours. Sepsis Labs: Recent Labs  Lab 12/02/20 1834 12/02/20 2304  LATICACIDVEN 1.6 1.4     Recent Results (from the past 240 hour(s))  Culture, blood (x 2)     Status: None   Collection Time: 12/02/20  6:34 PM   Specimen: BLOOD  Result Value Ref Range Status   Specimen Description BLOOD LEFT ANTECUBITAL  Final   Special Requests AEROBIC BOTTLE ONLY Blood Culture adequate volume  Final   Culture   Final    NO GROWTH 5 DAYS Performed at [facility], [address], [place], [place] [address]    Report Status 12/07/2020 FINAL  Final  Culture, blood (x 2)     Status: None   Collection Time: 12/02/20  6:34 PM   Specimen: BLOOD  Result Value Ref Range Status   Specimen Description BLOOD LEFT ANTECUBITAL  Final   Special Requests IN PEDIATRIC BOTTLE Blood Culture adequate volume  Final   Culture   Final    NO GROWTH 5 DAYS Performed at [facility] [address] [place], [place] [address]    Report Status 12/07/2020 FINAL  Final  Surgical pcr screen     Status: Abnormal   Collection Time: 12/27/2020  7:57 AM  Specimen: Nasal Mucosa; Nasal Swab  Result Value Ref Range Status   MRSA, PCR POSITIVE (A) NEGATIVE Final    Comment: RESULT CALLED TO, READ BACK BY AND VERIFIED WITH: RN S CLOTER [phone] AT 75 AM BY CM    Staphylococcus aureus POSITIVE (A) NEGATIVE Final    Comment: (NOTE) The Xpert SA Assay (FDA approved for NASAL specimens in patients 65 years of age and older), is one component of a comprehensive surveillance program. It is not intended to diagnose infection nor to guide or monitor treatment. Performed at [facility], [place] [address] [place], [place] [address]           Radiology Studies: No results found.      Scheduled Meds:  (feeding supplement) PROSource Plus  30 mL Oral BID BM   amiodarone  200 mg Oral Daily   aspirin EC  81 mg Oral QHS   atorvastatin  40 mg Oral QPM   Chlorhexidine Gluconate Cloth  6 each Topical Q0600   Chlorhexidine Gluconate Cloth  6 each Topical Q0600   Chlorhexidine Gluconate Cloth  6 each Topical Q0600   collagenase   Topical Daily   darbepoetin (ARANESP) injection - DIALYSIS  150 mcg Intravenous Q Mon-HD   doxercalciferol  1.5 mcg Intravenous Q M,W,F-HD   feeding supplement (NEPRO CARB STEADY)  237 mL Oral BID BM   gabapentin  300 mg Oral QHS   hydrocortisone  25 mg Rectal BID   insulin aspart  0-15 Units Subcutaneous TID WC   insulin detemir  16 Units Subcutaneous QHS   midodrine  5 mg Oral TID WC   multivitamin  1 tablet Oral QHS   mupirocin ointment  1 application Nasal BID   polyethylene glycol  17 g Oral Daily   senna-docusate  1 tablet Oral BID   sevelamer carbonate  2,400 mg Oral TID with meals   sodium chloride flush  3 mL Intravenous Q12H   sodium hypochlorite   Topical BID   Continuous Infusions:  sodium chloride     sodium chloride     sodium chloride 10 mL/hr at 12/04/20 0300   heparin 1,050 Units/hr (12/08/20 2343)          Aline August, MD [facility] 12/09/2020, 7:43 AM

## 2020-12-09 NOTE — Progress Notes (Addendum)
ANTICOAGULATION CONSULT NOTE - Follow Up Consult  Pharmacy Consult for Heparin (while Apixaban on hold) Indication: atrial fibrillation  Allergies  Allergen Reactions   Sulfa Antibiotics Swelling    Patient Measurements: Height: '5\' 4"'$  (162.6 cm) Weight: 82.6 kg (182 lb 1.6 oz) IBW/kg (Calculated) : 54.7 Heparin Dosing Weight: 75 kg  Vital Signs: Temp: 98.5 F (36.9 C) (08/13 0916) Temp Source: Oral (08/13 0525) BP: 122/49 (08/13 0916) Pulse Rate: 80 (08/13 0916)  Labs: Recent Labs    12/07/20 0803 12/07/20 2127 12/08/20 0315 12/08/20 1106 12/09/20 0745  HGB 7.1* 8.0* 7.8*  --  7.0*  HCT 22.9* 25.2* 24.2*  --  21.9*  PLT 705*  --  645*  --  634*  APTT  --   --   --  49* 94*  HEPARINUNFRC  --   --   --   --  >1.10*  CREATININE 5.16*  --   --   --  4.31*    Estimated Creatinine Clearance: 13.9 mL/min (A) (by C-G formula based on SCr of 4.31 mg/dL (H)).   Medications:  Infusions:   sodium chloride     sodium chloride     sodium chloride 10 mL/hr at 12/04/20 0300   heparin 1,050 Units/hr (12/08/20 2343)    Assessment: 75 YOF on Apixaban PTA for hx Afib - held and bridged with Lovenox 8/1 - 8/10 , s/p revision of B/L BKA to AKA on 8/9 by VVS. Also noted to have L-hand gangrene with amputation recommended but family has not been agreeable. Given increased pain - now being re-considered. Pharmacy consulted to bridge with Heparin while Apixaban on hold.   Lovenox use in ESRD can accumulate - making Heparin a safer option to utilize. Last Lovenox dose was 8/10 AM - will check a baseline aPTT prior to starting Heparin. Last Apix dose 8/12 AM - causing current heparin levels to falsely elevated for a period of time.  Will continue adjusting heparin gtt using aPTT for the present time. Current aPTT in goal range. Will obtain 2nd therapeutic aPTT in 8 hours.   Goal of Therapy:  Heparin level 0.3-0.7 units/ml aPTT 66-102 seconds Monitor platelets by anticoagulation  protocol: Yes   Plan:  - Continue heparin at a rate of 1050 units/hr - Baseline aPTT, daily aPTT/HL/CBC - Continue to monitor for any signs/symptoms of bleeding  - Follow up with 2nd aPTT level in 8 hours   Thank you for allowing pharmacy to be a part of this patient's care.  Ardyth Harps, PharmD Clinical Pharmacist

## 2020-12-09 NOTE — Progress Notes (Signed)
PIV stick attempted for blood transfusion as available site had heparin infusing. Unsuccessful. Pharmacist contacted re holding heparin gtt for blood transfusion. OK per pharmacist to hold heparin drip. Heparin turned off and  blood transfusion started.

## 2020-12-09 NOTE — Progress Notes (Signed)
Physical Therapy Wound Treatment Patient Details  Name: Christy Zhang MRN: [id number] Date of Birth: [birth year]  Today's Date: 12/09/2020 Time: 0175-1025 Time Calculation (min): 77 min  Subjective  Subjective Assessment Subjective: Lethargic initially, but aroused with positional change. RN present to provide IV pain medication. Pt stating, "I want to get up and go home." Patient and Family Stated Goals: None stated Date of Onset:  (Unknown) Prior Treatments:  (Dressing changes)  Pain Score:  8/10 (premedicated)  Wound Assessment  Pressure Injury 11/28/20 Sacrum Unstageable - Full thickness tissue loss in which the base of the injury is covered by slough (yellow, tan, gray, green or brown) and/or eschar (tan, brown or black) in the wound bed. (Active)  Dressing Type Barrier Film (skin prep);Foam - Lift dressing to assess site every shift;Moist to moist;Dakin's-soaked gauze 12/09/20 1612  Dressing Changed 12/09/20 1612  Dressing Change Frequency Daily 12/09/20 1612  State of Healing Eschar 12/09/20 1612  Site / Wound Assessment Black;Yellow;Pink 12/09/20 1612  % Wound base Red or Granulating 35% 12/09/20 1612  % Wound base Yellow/Fibrinous Exudate 15% 12/09/20 1612  % Wound base Black/Eschar 50% 12/09/20 1612  % Wound base Other/Granulation Tissue (Comment) 0% 12/09/20 1612  Peri-wound Assessment Intact 12/09/20 1612  Wound Length (cm) 13 cm 12/06/20 1400  Wound Width (cm) 9 cm 12/06/20 1400  Wound Depth (cm) 2 cm 12/06/20 1400  Wound Surface Area (cm^2) 117 cm^2 12/06/20 1400  Wound Volume (cm^3) 234 cm^3 12/06/20 1400  Tunneling (cm) 0 12/06/20 1457  Undermining (cm) 2.5 cm at 9:00, 1.9 cm at 6:00 12/06/20 1400  Margins Unattached edges (unapproximated) 12/09/20 1612  Drainage Amount Minimal 12/09/20 1612  Drainage Description Purulent 12/09/20 1612  Treatment Debridement (Selective);Hydrotherapy (Pulse lavage);Other (Comment) 12/09/20 1612     Pressure Injury 11/28/20  Buttocks Left Unstageable - Full thickness tissue loss in which the base of the injury is covered by slough (yellow, tan, gray, green or brown) and/or eschar (tan, brown or black) in the wound bed. (Active)  Dressing Type Dakin's-soaked gauze 12/09/20 1612  Dressing Changed 12/09/20 1612  Dressing Change Frequency Daily 12/09/20 1612  State of Healing Eschar 12/09/20 1612  Site / Wound Assessment Black;Yellow;Pink 12/09/20 1612  % Wound base Red or Granulating 5% 12/09/20 1612  % Wound base Yellow/Fibrinous Exudate 25% 12/09/20 1612  % Wound base Black/Eschar 70% 12/09/20 1612  % Wound base Other/Granulation Tissue (Comment) 0% 12/09/20 1612  Peri-wound Assessment Intact 12/09/20 1612  Wound Length (cm) 10.5 cm 12/06/20 1400  Wound Width (cm) 10.2 cm 12/06/20 1400  Wound Depth (cm) 1 cm 12/06/20 1400  Wound Surface Area (cm^2) 107.1 cm^2 12/06/20 1400  Wound Volume (cm^3) 107.1 cm^3 12/06/20 1400  Tunneling (cm) 0 12/06/20 1400  Undermining (cm) 0 12/06/20 1400  Margins Unattached edges (unapproximated) 12/09/20 1612  Drainage Amount Minimal 12/09/20 1612  Drainage Description Purulent;Odor 12/09/20 1612  Treatment Debridement (Selective);Hydrotherapy (Pulse lavage);Other (Comment) 12/09/20 1612     Pressure Injury 11/28/20 Vertebral column Lower Unstageable - Full thickness tissue loss in which the base of the injury is covered by slough (yellow, tan, gray, green or brown) and/or eschar (tan, brown or black) in the wound bed. (Active)  Dressing Type Foam - Lift dressing to assess site every shift 12/06/20 2300  Dressing Clean;Dry;Intact 12/08/20 0830  Dressing Change Frequency Twice a day 12/02/20 1100  State of Healing Non-healing 12/06/20 1457  Site / Wound Assessment Dressing in place / Unable to assess 12/08/20 0830  % Wound base  Black/Eschar 100% 12/02/20 1100  Wound Length (cm) 3 cm 12/06/20 1457  Wound Width (cm) 1.4 cm 12/06/20 1457  Wound Surface Area (cm^2) 4.2 cm^2  12/06/20 1457  Drainage Amount None 12/06/20 2300  Treatment Hydrotherapy (Pulse lavage) 12/07/20 1500        Pressure Injury 11/28/20 Flank Right Unstageable - Full thickness tissue loss in which the base of the injury is covered by slough (yellow, tan, gray, green or brown) and/or eschar (tan, brown or black) in the wound bed. (Active)  Dressing Type Barrier Film (skin prep);Dakin's-soaked gauze;Moist to moist 12/09/20 1612  Dressing Changed;Clean;Dry;Intact 12/09/20 1612  Dressing Change Frequency Daily 12/09/20 1612  State of Healing Eschar 12/09/20 1612  Site / Wound Assessment Brown;Pink;Yellow;Black 12/09/20 1612  % Wound base Red or Granulating 10% 12/09/20 1612  % Wound base Yellow/Fibrinous Exudate 60% 12/09/20 1612  % Wound base Black/Eschar 30% 12/09/20 1612  % Wound base Other/Granulation Tissue (Comment) 0% 12/09/20 1612  Peri-wound Assessment Intact 12/09/20 1612  Wound Length (cm) 7 cm 12/06/20 1400  Wound Width (cm) 23 cm 12/06/20 1400  Wound Depth (cm) 1 cm 12/06/20 1400  Wound Surface Area (cm^2) 161 cm^2 12/06/20 1400  Wound Volume (cm^3) 161 cm^3 12/06/20 1400  Tunneling (cm) 0 12/06/20 1457  Undermining (cm) 0 12/06/20 1457  Margins Unattached edges (unapproximated) 12/09/20 1612  Drainage Amount Minimal 12/09/20 1612  Drainage Description Serosanguineous;Odor 12/09/20 1612  Treatment Debridement (Selective);Hydrotherapy (Pulse lavage);Other (Comment) 12/09/20 1612     Pressure Injury 11/28/20 Hip Right;Lateral Unstageable - Full thickness tissue loss in which the base of the injury is covered by slough (yellow, tan, gray, green or brown) and/or eschar (tan, brown or black) in the wound bed. (Active)  Dressing Type Barrier Film (skin prep);Dakin's-soaked gauze;Moist to moist 12/09/20 1612  Dressing Changed;Clean;Dry;Intact 12/09/20 1612  Dressing Change Frequency Daily 12/09/20 1612  State of Healing Eschar 12/09/20 1612  Site / Wound Assessment  Pink;Yellow;Black 12/09/20 1612  % Wound base Red or Granulating 10% 12/09/20 1612  % Wound base Yellow/Fibrinous Exudate 80% 12/09/20 1612  % Wound base Black/Eschar 10% 12/09/20 1612  % Wound base Other/Granulation Tissue (Comment) 0% 12/09/20 1612  Peri-wound Assessment Intact 12/09/20 1612  Wound Length (cm) 5 cm 12/06/20 1400  Wound Width (cm) 4.5 cm 12/06/20 1400  Wound Depth (cm) 1 cm 12/06/20 1400  Wound Surface Area (cm^2) 22.5 cm^2 12/06/20 1400  Wound Volume (cm^3) 22.5 cm^3 12/06/20 1400  Tunneling (cm) 0 12/06/20 1457  Undermining (cm) 0 12/06/20 1457  Margins Unattached edges (unapproximated) 12/09/20 1612  Drainage Amount Scant 12/09/20 1612  Drainage Description Serosanguineous 12/09/20 1612  Treatment Debridement (Selective);Hydrotherapy (Pulse lavage);Other (Comment) 12/09/20 1612         Hydrotherapy Pulsed lavage therapy - wound location: Sacrum, R hip, L hip, and R flank Pulsed Lavage with Suction (psi): 8 psi Pulsed Lavage with Suction - Normal Saline Used: 2000 mL Pulsed Lavage Tip: Tip with splash shield Selective Debridement Selective Debridement - Location: Sacrum, R hip, L hip, and R flank Selective Debridement - Tools Used: Forceps, Scalpel, Scissors Selective Debridement - Tissue Removed: Black eschar and yellow unviable tissue    Wound Assessment and Plan  Wound Therapy - Assess/Plan/Recommendations Wound Therapy - Clinical Statement: Pt initially lethargic, however, became more alert and painful during session and therefore RN present to provide IV pain medication. Wounds continue to appear dry with large amounts of necrotic tissue able to be debrided. Will continue to benefit from hydrotherapy for selective removal  of unviable tissue, to decrease bioburden, and promote wound bed healing. Wound Therapy - Functional Problem List: Decreased tolerance for position changes and OOB Factors Delaying/Impairing Wound Healing: Diabetes Mellitus, Immobility,  Multiple medical problems Hydrotherapy Plan: Debridement, Dressing change, Patient/family education, Pulsatile lavage with suction Wound Therapy - Frequency: 6X / week Wound Therapy - Follow Up Recommendations: dressing changes by RN  Wound Therapy Goals- Improve the function of patient's integumentary system by progressing the wound(s) through the phases of wound healing (inflammation - proliferation - remodeling) by: Wound Therapy Goals - Improve the function of patient's integumentary system by progressing the wound(s) through the phases of wound healing by: Decrease Necrotic Tissue to: 20% Decrease Necrotic Tissue - Progress: Progressing toward goal Increase Granulation Tissue to: 80% Increase Granulation Tissue - Progress: Progressing toward goal Goals/treatment plan/discharge plan were made with and agreed upon by patient/family: Yes Time For Goal Achievement: 7 days Wound Therapy - Potential for Goals: Fair  Goals will be updated until maximal potential achieved or discharge criteria met.  Discharge criteria: when goals achieved, discharge from hospital, MD decision/surgical intervention, no progress towards goals, refusal/missing three consecutive treatments without notification or medical reason.  GP     Charges PT Wound Care Charges $Wound Debridement up to 20 cm: < or equal to 20 cm $ Wound Debridement each add'l 20 sqcm: 9 $PT PLS Gun and Tip: 1 Supply $PT Hydrotherapy Visit: 1 Visit     Wyona Almas, PT, DPT Acute Rehabilitation Services Pager [phone] Office [place] 12/09/2020, 4:20 PM

## 2020-12-09 NOTE — Progress Notes (Signed)
Patient ID: Christy Zhang, female   DOB: 1955-12-14, 65 y.o.   MRN: [id number]  [facility] Progress Note   Assessment/ Plan:   1.  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 ESRD:  - Continue HD per MWF schedule  - With her current wounds on the back/inability to sit in recliner, she is unable to undertake outpatient dialysis and will need LTAC per charting  3. Anemia CKD: Secondary to end-stage renal disease as well as malnutrition-inflammation complex from multiple wounds on her back/limb gangrene.  On aranesp 150 mcg every Monday.  S/p PRBC's on 8/11 and for PRBC's on 8/13.  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 Nutrition: Albumin level significantly depressed secondary to multifocal sites of inflammation.    6. Hypotension - on midodrine. At goal off of HD.  Would give albumin with HD as per charting hypotension limited fluid removal.  Optimize anemia  7. Leukocytosis - marked leukocytosis. Abx per primary team.  Blood cultures negative.  Work-up per primary team   Disposition - would need LTAC per charting  Subjective:   She is charted as only having 746 mL off during HD on 8/12.  States she feels ok - she tells me waiting for someone to turn her.  More talkative today than prior.  Review of systems:  Denies shortness of breath or chest pain Denies n/v    Objective:   BP (!) 122/49 (BP Location: Left Arm)   Pulse 80   Temp 98.5 F (36.9 C)   Resp 17   Ht '5\' 4"'$  (1.626 m)   Wt 82.6 kg   LMP  (LMP Unknown)   SpO2 96%   BMI 31.26 kg/m   Physical Exam:   General adult female in bed in no acute distress, obese habitus HEENT normocephalic  atraumatic Neck supple trachea midline Lungs clear to auscultation bilaterally normal work of breathing at rest; on 2 liters Heart S1S2 no rub Abdomen soft nontender nondistended Extremities bilateral AKA; gangrenous changes to left hand  Psych no agitation Neuro - awake on arrival and oriented to person, year, and location (though guesses on location twice)  Access: RUE AVF bruit  Labs: BMET Recent Labs  Lab 12/02/20 1834 12/03/20 0254 12/04/20 0707 12/14/2020 0404 12/06/20 0635 12/07/20 0803 12/09/20 0745  NA 134* 134* 133* 133* 133* 135 134*  K 4.0 5.4* 5.7* 4.2 4.9 4.6 4.2  CL 102 95* 92* 96* 99 98 98  CO2 19* '23 25 25 22 23 26  '$ GLUCOSE 138* 154* 168* 163* 111* 276* 226*  BUN 32* 44* 63* 35* 50* 41* 42*  CREATININE 5.60* 7.31* 8.83* 5.25* 6.62* 5.16* 4.31*  CALCIUM 6.9* 8.5* 8.6* 8.7* 8.7* 8.6* 8.3*  PHOS  --  5.7* 5.7* 3.6  --   --   --    CBC Recent Labs  Lab 12/02/20 1834 12/02/20 2304 12/06/20 0635 12/07/20 0803 12/07/20 2127 12/08/20 0315 12/09/20 0745  WBC 44.2*   < > 44.9* 49.1*  --  45.6* 42.4*  NEUTROABS 39.5*  --   --   --   --  38.7* 37.7*  HGB 6.4*   < > 8.1* 7.1* 8.0* 7.8*  7.0*  HCT 21.5*   < > 25.5* 22.9* 25.2* 24.2* 21.9*  MCV 78.8*   < > 76.3* 77.9*  --  77.6* 78.8*  PLT 623*   < > 734* 705*  --  645* 634*   < > = values in this interval not displayed.      Medications:     (feeding supplement) PROSource Plus  30 mL Oral BID BM   amiodarone  200 mg Oral Daily   aspirin EC  81 mg Oral QHS   atorvastatin  40 mg Oral QPM   Chlorhexidine Gluconate Cloth  6 each Topical Q0600   Chlorhexidine Gluconate Cloth  6 each Topical Q0600   Chlorhexidine Gluconate Cloth  6 each Topical Q0600   collagenase   Topical Daily   darbepoetin (ARANESP) injection - DIALYSIS  150 mcg Intravenous Q Mon-HD   doxercalciferol  1.5 mcg Intravenous Q M,W,F-HD   feeding supplement (NEPRO CARB STEADY)  237 mL Oral BID BM   gabapentin  300 mg Oral QHS   hydrocortisone   25 mg Rectal BID   insulin aspart  0-15 Units Subcutaneous TID WC   insulin detemir  16 Units Subcutaneous QHS   midodrine  5 mg Oral TID WC   multivitamin  1 tablet Oral QHS   mupirocin ointment  1 application Nasal BID   polyethylene glycol  17 g Oral Daily   senna-docusate  1 tablet Oral BID   sevelamer carbonate  2,400 mg Oral TID with meals   sodium chloride flush  3 mL Intravenous Q12H   sodium hypochlorite   Topical BID   Claudia Desanctis, MD 12/09/2020, 12:42 PM

## 2020-12-10 DIAGNOSIS — N186 End stage renal disease: Secondary | ICD-10-CM | POA: Diagnosis not present

## 2020-12-10 DIAGNOSIS — Z992 Dependence on renal dialysis: Secondary | ICD-10-CM | POA: Diagnosis not present

## 2020-12-10 DIAGNOSIS — I96 Gangrene, not elsewhere classified: Secondary | ICD-10-CM | POA: Diagnosis not present

## 2020-12-10 DIAGNOSIS — I480 Paroxysmal atrial fibrillation: Secondary | ICD-10-CM | POA: Diagnosis not present

## 2020-12-10 LAB — BPAM RBC
Blood Product Expiration Date: 202209012359
Blood Product Expiration Date: 202209012359
ISSUE DATE / TIME: 202208111553
ISSUE DATE / TIME: 202208131454
Unit Type and Rh: 7300
Unit Type and Rh: 7300

## 2020-12-10 LAB — TYPE AND SCREEN
ABO/RH(D): B POS
Antibody Screen: NEGATIVE
Unit division: 0
Unit division: 0

## 2020-12-10 LAB — CBC
HCT: 25.1 % — ABNORMAL LOW (ref 36.0–46.0)
Hemoglobin: 7.9 g/dL — ABNORMAL LOW (ref 12.0–15.0)
MCH: 25.6 pg — ABNORMAL LOW (ref 26.0–34.0)
MCHC: 31.5 g/dL (ref 30.0–36.0)
MCV: 81.2 fL (ref 80.0–100.0)
Platelets: 572 K/uL — ABNORMAL HIGH (ref 150–400)
RBC: 3.09 MIL/uL — ABNORMAL LOW (ref 3.87–5.11)
RDW: 23 % — ABNORMAL HIGH (ref 11.5–15.5)
WBC: 41.5 K/uL — ABNORMAL HIGH (ref 4.0–10.5)
nRBC: 0.9 % — ABNORMAL HIGH (ref 0.0–0.2)

## 2020-12-10 LAB — BASIC METABOLIC PANEL WITH GFR
Anion gap: 13 (ref 5–15)
BUN: 59 mg/dL — ABNORMAL HIGH (ref 8–23)
CO2: 21 mmol/L — ABNORMAL LOW (ref 22–32)
Calcium: 8.5 mg/dL — ABNORMAL LOW (ref 8.9–10.3)
Chloride: 98 mmol/L (ref 98–111)
Creatinine, Ser: 5.6 mg/dL — ABNORMAL HIGH (ref 0.44–1.00)
GFR, Estimated: 8 mL/min — ABNORMAL LOW
Glucose, Bld: 255 mg/dL — ABNORMAL HIGH (ref 70–99)
Potassium: 4.4 mmol/L (ref 3.5–5.1)
Sodium: 132 mmol/L — ABNORMAL LOW (ref 135–145)

## 2020-12-10 LAB — GLUCOSE, CAPILLARY
Glucose-Capillary: 250 mg/dL — ABNORMAL HIGH (ref 70–99)
Glucose-Capillary: 256 mg/dL — ABNORMAL HIGH (ref 70–99)
Glucose-Capillary: 250 mg/dL — ABNORMAL HIGH (ref 70–99)
Glucose-Capillary: 337 mg/dL — ABNORMAL HIGH (ref 70–99)

## 2020-12-10 LAB — HEPARIN LEVEL (UNFRACTIONATED): Heparin Unfractionated: >1.1 [IU]/mL — ABNORMAL HIGH (ref 0.30–0.70)

## 2020-12-10 LAB — APTT: aPTT: 35 s (ref 24–36)

## 2020-12-10 MED ORDER — CHLORHEXIDINE GLUCONATE CLOTH 2 % EX PADS
6.0000 | MEDICATED_PAD | Freq: Every day | CUTANEOUS | Status: DC
  Administered 2020-12-11 – 2020-12-17 (×7): 6 via TOPICAL

## 2020-12-10 MED ORDER — APIXABAN 5 MG PO TABS
5.0000 mg | ORAL_TABLET | Freq: Two times a day (BID) | ORAL | Status: DC
  Administered 2020-12-10 – 2020-12-18 (×14): 5 mg via ORAL
  Filled 2020-12-10 (×16): qty 1

## 2020-12-10 MED ORDER — MIDODRINE HCL 5 MG PO TABS
10.0000 mg | ORAL_TABLET | Freq: Three times a day (TID) | ORAL | Status: DC
  Administered 2020-12-10 – 2020-12-18 (×20): 10 mg via ORAL
  Filled 2020-12-10 (×19): qty 2

## 2020-12-10 MED ORDER — OXYCODONE HCL 5 MG PO TABS
5.0000 mg | ORAL_TABLET | Freq: Four times a day (QID) | ORAL | Status: DC | PRN
  Administered 2020-12-10: 5 mg via ORAL
  Administered 2020-12-12 – 2020-12-15 (×6): 10 mg via ORAL
  Administered 2020-12-16: 5 mg via ORAL
  Filled 2020-12-10 (×3): qty 2
  Filled 2020-12-10: qty 1
  Filled 2020-12-10 (×3): qty 2
  Filled 2020-12-10: qty 1
  Filled 2020-12-10 (×2): qty 2

## 2020-12-10 NOTE — Progress Notes (Signed)
Patient ID: Christy Zhang, female   DOB: 01-06-1956, 65 y.o.   MRN: [id number]  [facility] Progress Note   Assessment/ Plan:   1.  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 ESRD:  - Continue HD per MWF schedule  - With her current wounds on the back/inability to sit in recliner, she is unable to undertake outpatient dialysis and will need LTAC per charting  3. Anemia CKD: Secondary to end-stage renal disease as well as malnutrition-inflammation complex from multiple wounds on her back/limb gangrene.  On aranesp 150 mcg every Monday.  S/p PRBC's on 8/11 and on 8/13.  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 Phos in am  5. Nutrition: Albumin level significantly depressed secondary to multifocal sites of inflammation.    6. Hypotension - on midodrine - increased to 10 mg TID.  Would give albumin with HD as per charting hypotension limited fluid removal.  Optimize anemia  7. Leukocytosis - marked leukocytosis. Abx per primary team.  Blood cultures negative.  Work-up per primary team   Disposition - would need LTAC per charting  Subjective:   She is charted as only having 746 mL off during HD on 8/12.  More awake today.  She hasn't had much or any breakfast she states  Review of systems:   Denies shortness of breath or chest pain Denies n/v    Objective:   BP (!) 104/55 (BP Location: Left Arm)   Pulse 76   Temp 99.4 F (37.4 C) (Oral)   Resp 16   Ht '5\' 4"'$  (1.626 m)   Wt 81.2 kg   LMP  (LMP Unknown)   SpO2 100%   BMI 30.73 kg/m   Physical Exam:    General adult female in bed in no acute distress, obese habitus HEENT normocephalic atraumatic Neck supple  trachea midline Lungs clear to auscultation bilaterally normal work of breathing at rest; on supplemental oxygen Heart S1S2 no rub Abdomen soft nontender nondistended; obese habitus Extremities bilateral AKA; gangrenous changes to left hand  Psych no agitation Neuro - awake on arrival and oriented to person, year, and location (thinks about the year then answers)  Access: RUE AVF bruit  Labs: BMET Recent Labs  Lab 12/04/20 0707 12/04/2020 0404 12/06/20 0635 12/07/20 0803 12/09/20 0745 12/10/20 0640  NA 133* 133* 133* 135 134* 132*  K 5.7* 4.2 4.9 4.6 4.2 4.4  CL 92* 96* 99 98 98 98  CO2 '25 25 22 23 26 '$ 21*  GLUCOSE 168* 163* 111* 276* 226* 255*  BUN 63* 35* 50* 41* 42* 59*  CREATININE 8.83* 5.25* 6.62* 5.16* 4.31* 5.60*  CALCIUM 8.6* 8.7* 8.7* 8.6* 8.3* 8.5*  PHOS 5.7* 3.6  --   --   --   --    CBC Recent Labs  Lab 12/07/20 0803 12/07/20 2127 12/08/20 0315 12/09/20 0745 12/09/20 2122 12/10/20 0348  WBC 49.1*  --  45.6* 42.4*  --  41.5*  NEUTROABS  --   --  38.7* 37.7*  --   --   HGB 7.1*   < > 7.8* 7.0* 7.7* 7.9*  HCT 22.9*   < > 24.2* 21.9* 24.5* 25.1*  MCV 77.9*  --  77.6* 78.8*  --  81.2  PLT 705*  --  645* 634*  --  572*   < > = values in this interval not displayed.      Medications:     (feeding supplement) PROSource Plus  30 mL Oral BID BM   amiodarone  200 mg Oral Daily   apixaban  5 mg Oral BID   aspirin EC  81 mg Oral QHS   atorvastatin  40 mg Oral QPM   Chlorhexidine Gluconate Cloth  6 each Topical Q0600   Chlorhexidine Gluconate Cloth  6 each Topical Q0600   Chlorhexidine Gluconate Cloth  6 each Topical Q0600   collagenase   Topical Daily   darbepoetin (ARANESP) injection - DIALYSIS  150 mcg Intravenous Q Mon-HD   doxercalciferol  1.5 mcg Intravenous Q M,W,F-HD   feeding supplement (NEPRO CARB STEADY)  237 mL Oral BID BM   gabapentin  300 mg Oral QHS   hydrocortisone  25 mg Rectal BID   insulin aspart  0-15 Units Subcutaneous TID WC   insulin  detemir  16 Units Subcutaneous QHS   midodrine  5 mg Oral TID WC   multivitamin  1 tablet Oral QHS   mupirocin ointment  1 application Nasal BID   polyethylene glycol  17 g Oral Daily   senna-docusate  1 tablet Oral BID   sevelamer carbonate  2,400 mg Oral TID with meals   sodium chloride flush  3 mL Intravenous Q12H   Claudia Desanctis, MD 12/10/2020, 1:05 PM

## 2020-12-10 NOTE — Progress Notes (Signed)
Patient ID: Christy Zhang, female   DOB: [birth year], 65 y.o.   MRN: [id number]  PROGRESS NOTE    Christy Zhang  [name] DOB: 1956/02/18 DOA: 12/24/2020 PCP: Claretta Fraise, MD   Brief Narrative:   39 old female with history of bilateral BKA, ESRD on dialysis,  type 2 diabetes, hypertension, peripheral vascular disease, hyperlipidemia, recently discharged from here on 11/14/2020 after prolonged hospitalization when she was admitted with altered mental status and had to be intubated for airway protection and also underwent CRRT in the setting of septic shock from pneumonia causing acute respiratory failure with hypoxia, AKI.  She presented at [facility] hospital ED because it was too painful to sit down for dialysis due to her decubitus ulcer and she also missed 2 session of dialysis.  Patient was sent to [facility] from [facility] for further management.  Nephrology,orthopedics,palliative care vascular surgery consulted.  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 -WBCs improving to 41.5 today.  Antibiotics discontinued on 12/07/2020 after discussion with vascular surgery. -Spoke with Dr. Alberteen Sam surgery on phone on 12/07/2020 who recommended to continue monitoring for now and if hand becomes more painful or changes into her gangrene, patient will need surgical intervention. -Spoke to daughter on phone on 12/07/2020 who was very concerned about her mother's hand because of decreased mobility and increasing pain. Dr. Grandville Silos spoke to the patient's daughter on 12/08/2020 on phone.  Currently no plans for hand surgery.  Sacral and right hip decubitus  ulcers: Present on admission -Currently undergoing hydrotherapy as per general surgery recommendations (did not recommend debridement).  Continue Santyl  End-stage renal disease Anemia of chronic disease Hyperkalemia: Resolved -Continue dialysis as per nephrology schedule. -she is unable to sit for dialysis and at this point we are not sure how she will receive outpt dialysis.  Will need LTAC as per nephrology -Hemoglobin currently stable.  Thrombocytosis -Possibly reactive.  Monitor  Mild hyponatremia -Nephrology managing by dialysis  Obesity-outpatient follow-up  Paroxysmal A. Fib -Continue amiodarone.  Currently on heparin since there was a question regarding possibility of patient undergoing hand surgery.    Left renal mass -MRI was attempted x4 but subsequently canceled  Diabetes mellitus type 2 -Continue Levemir and CBGs with SSI.  A1c 6.4 on 10/27/2020  Generalized deconditioning Goals of care -Overall prognosis is very poor.  Palliative care has evaluated the patient during this hospitalization.  Patient remains full code.   DVT prophylaxis: Resume Eliquis  code Status: Full Family Communication: spoke to daughter on phone on 12/07/2020 Disposition Plan: Status is: Inpatient  Remains inpatient appropriate because:Inpatient level of care appropriate due to severity of illness  Dispo: The patient is from: Home              Anticipated d/c is to: LTAC once bed is available              Patient currently is not medically stable to d/c.   Difficult to place patient No    Consultants: Nephrology/vascular surgery/general surgery  Procedures: Bilateral AKA on 12/27/2020  Antimicrobials:  Anti-infectives (From admission, onward)    Start     Dose/Rate Route Frequency Ordered Stop   12/08/20 1200  vancomycin (VANCOCIN) IVPB 1000 mg/200  mL premix  Status:  Discontinued        1,000 mg 200 mL/hr over 60 Minutes Intravenous Every M-W-F (Hemodialysis) 12/06/20 1138  12/07/20 1005   12/14/2020 0600  vancomycin (VANCOCIN) IVPB 1000 mg/200 mL premix        1,000 mg 200 mL/hr over 60 Minutes Intravenous To Surgery 12/04/20 1557 12/03/2020 1038   12/04/20 1200  vancomycin (VANCOCIN) IVPB 1000 mg/200 mL premix  Status:  Discontinued        1,000 mg 200 mL/hr over 60 Minutes Intravenous Every M-W-F (Hemodialysis) 12/02/20 1734 12/06/20 1138   12/03/20 1800  ceFEPIme (MAXIPIME) 1 g in sodium chloride 0.9 % 100 mL IVPB  Status:  Discontinued        1 g 200 mL/hr over 30 Minutes Intravenous Every 24 hours 12/02/20 1734 12/07/20 1005   12/02/20 1830  vancomycin (VANCOREADY) IVPB 1750 mg/350 mL        1,750 mg 175 mL/hr over 120 Minutes Intravenous  Once 12/02/20 1734 12/02/20 2156   12/02/20 1800  ceFEPIme (MAXIPIME) 2 g in sodium chloride 0.9 % 100 mL IVPB        2 g 200 mL/hr over 30 Minutes Intravenous  Once 12/02/20 1707 12/02/20 2003   12/02/20 1800  metroNIDAZOLE (FLAGYL) IVPB 500 mg  Status:  Discontinued        500 mg 100 mL/hr over 60 Minutes Intravenous Every 8 hours 12/02/20 1707 12/07/20 1005   12/02/20 1800  vancomycin (VANCOCIN) IVPB 1000 mg/200 mL premix  Status:  Discontinued        1,000 mg 200 mL/hr over 60 Minutes Intravenous  Once 12/02/20 1707 12/02/20 1734        Subjective: Patient seen and examined at bedside.  Poor historian.  No fever, vomiting or worsening shortness of breath reported.  Patient very drowsy this morning.   Objective: Vitals:   12/09/20 1520 12/09/20 1754 12/09/20 2149 12/10/20 0515  BP: (!) 103/48 (!) 126/49 (!) 110/44 (!) 111/58  Pulse: 74 73 79 72  Resp: '18 19 18 20  '$ Temp: 98.8 F (37.1 C) 99.8 F (37.7 C) 98.7 F (37.1 C) 98.7 F (37.1 C)  TempSrc:  Oral Oral Oral  SpO2:  96% 100% 93%  Weight:   81.2 kg   Height:        Intake/Output Summary (Last 24 hours) at 12/10/2020 0756 Last data filed at 12/10/2020 0410 Gross per 24 hour  Intake 798.53 ml  Output --  Net 798.53 ml    Filed Weights    12/07/20 2246 12/08/20 1500 12/09/20 2149  Weight: 82.6 kg 82.6 kg 81.2 kg    Examination:  General exam: Looks chronically ill and deconditioned.  Still on 2 L oxygen by nasal cannula.  No acute distress.   Respiratory system: Bilateral decreased air sounds at bases with scattered crackles  cardiovascular system: S1-S2 heard, rate controlled gastrointestinal system: Abdomen is mildly distended, soft and nontender.  Normal bowel sounds heard  extremities: Has bilateral AKA with dressing  Central nervous system: Very drowsy this morning, wakes up only slightly.  Hardly answers any questions.  Poor historian.  No obvious focal deficits.   Skin: Left hand gangrenous changes present  psychiatry: Could not be assessed because of mental status.  Data Reviewed: I have personally reviewed following labs and imaging studies  CBC: Recent Labs  Lab 12/06/20 0635 12/07/20 0803 12/07/20 2127 12/08/20 0315 12/09/20 0745 12/09/20 2122 12/10/20 0348  WBC 44.9* 49.1*  --  45.6* 42.4*  --  41.5*  NEUTROABS  --   --   --  38.7* 37.7*  --   --   HGB 8.1* 7.1* 8.0* 7.8* 7.0* 7.7* 7.9*  HCT 25.5* 22.9* 25.2* 24.2* 21.9* 24.5* 25.1*  MCV 76.3* 77.9*  --  77.6* 78.8*  --  81.2  PLT 734* 705*  --  645* 634*  --  572*    Basic Metabolic Panel: Recent Labs  Lab 12/04/20 0707 12/18/2020 0404 12/06/20 0635 12/07/20 0803 12/09/20 0745 12/10/20 0640  NA 133* 133* 133* 135 134* 132*  K 5.7* 4.2 4.9 4.6 4.2 4.4  CL 92* 96* 99 98 98 98  CO2 '25 25 22 23 26 '$ 21*  GLUCOSE 168* 163* 111* 276* 226* 255*  BUN 63* 35* 50* 41* 42* 59*  CREATININE 8.83* 5.25* 6.62* 5.16* 4.31* 5.60*  CALCIUM 8.6* 8.7* 8.7* 8.6* 8.3* 8.5*  PHOS 5.7* 3.6  --   --   --   --     GFR: Estimated Creatinine Clearance: 10.6 mL/min (A) (by C-G formula based on SCr of 5.6 mg/dL (H)). Liver Function Tests: Recent Labs  Lab 12/04/20 0707 12/08/2020 0404  ALBUMIN 1.6* 1.9*    No results for input(s): LIPASE, AMYLASE in the last  168 hours. No results for input(s): AMMONIA in the last 168 hours. Coagulation Profile: No results for input(s): INR, PROTIME in the last 168 hours.  Cardiac Enzymes: No results for input(s): CKTOTAL, CKMB, CKMBINDEX, TROPONINI in the last 168 hours. BNP (last 3 results) No results for input(s): PROBNP in the last 8760 hours. HbA1C: No results for input(s): HGBA1C in the last 72 hours. CBG: Recent Labs  Lab 12/09/20 1153 12/09/20 1157 12/09/20 1659 12/09/20 2145 12/10/20 0635  GLUCAP 216* 227* 247* 194* 250*    Lipid Profile: No results for input(s): CHOL, HDL, LDLCALC, TRIG, CHOLHDL, LDLDIRECT in the last 72 hours. Thyroid Function Tests: No results for input(s): TSH, T4TOTAL, FREET4, T3FREE, THYROIDAB in the last 72 hours. Anemia Panel: No results for input(s): VITAMINB12, FOLATE, FERRITIN, TIBC, IRON, RETICCTPCT in the last 72 hours. Sepsis Labs: No results for input(s): PROCALCITON, LATICACIDVEN in the last 168 hours.   Recent Results (from the past 240 hour(s))  Culture, blood (x 2)     Status: None   Collection Time: 12/02/20  6:34 PM   Specimen: BLOOD  Result Value Ref Range Status   Specimen Description BLOOD LEFT ANTECUBITAL  Final   Special Requests AEROBIC BOTTLE ONLY Blood Culture adequate volume  Final   Culture   Final    NO GROWTH 5 DAYS Performed at [facility], [address] [place], [place] [address]    Report Status 12/07/2020 FINAL  Final  Culture, blood (x 2)     Status: None   Collection Time: 12/02/20  6:34 PM   Specimen: BLOOD  Result Value Ref Range Status   Specimen Description BLOOD LEFT ANTECUBITAL  Final   Special Requests IN PEDIATRIC BOTTLE Blood Culture adequate volume  Final   Culture   Final    NO GROWTH 5 DAYS Performed at [facility] [address], [place], [place] [address]    Report Status 12/07/2020 FINAL  Final  Surgical pcr screen     Status: Abnormal   Collection Time: 12/02/2020  7:57 AM   Specimen:  Nasal Mucosa; Nasal Swab  Result Value Ref Range Status   MRSA, PCR POSITIVE (A) NEGATIVE Final    Comment: RESULT  CALLED TO, READ BACK BY AND VERIFIED WITH: RN S CLOTER [phone] AT 27 AM BY CM    Staphylococcus aureus POSITIVE (A) NEGATIVE Final    Comment: (NOTE) The Xpert SA Assay (FDA approved for NASAL specimens in patients 47 years of age and older), is one component of a comprehensive surveillance program. It is not intended to diagnose infection nor to guide or monitor treatment. Performed at [facility], [place] [address], [place], [place] [address]           Radiology Studies: No results found.      Scheduled Meds:  (feeding supplement) PROSource Plus  30 mL Oral BID BM   amiodarone  200 mg Oral Daily   apixaban  5 mg Oral BID   aspirin EC  81 mg Oral QHS   atorvastatin  40 mg Oral QPM   Chlorhexidine Gluconate Cloth  6 each Topical Q0600   Chlorhexidine Gluconate Cloth  6 each Topical Q0600   Chlorhexidine Gluconate Cloth  6 each Topical Q0600   collagenase   Topical Daily   darbepoetin (ARANESP) injection - DIALYSIS  150 mcg Intravenous Q Mon-HD   doxercalciferol  1.5 mcg Intravenous Q M,W,F-HD   feeding supplement (NEPRO CARB STEADY)  237 mL Oral BID BM   gabapentin  300 mg Oral QHS   hydrocortisone  25 mg Rectal BID   insulin aspart  0-15 Units Subcutaneous TID WC   insulin detemir  16 Units Subcutaneous QHS   midodrine  5 mg Oral TID WC   multivitamin  1 tablet Oral QHS   mupirocin ointment  1 application Nasal BID   polyethylene glycol  17 g Oral Daily   senna-docusate  1 tablet Oral BID   sevelamer carbonate  2,400 mg Oral TID with meals   sodium chloride flush  3 mL Intravenous Q12H   Continuous Infusions:  sodium chloride     sodium chloride     sodium chloride 10 mL/hr at 12/04/20 0300          Aline August, MD [facility] 12/10/2020, 7:56 AM

## 2020-12-11 DIAGNOSIS — Z992 Dependence on renal dialysis: Secondary | ICD-10-CM | POA: Diagnosis not present

## 2020-12-11 DIAGNOSIS — I96 Gangrene, not elsewhere classified: Secondary | ICD-10-CM | POA: Diagnosis not present

## 2020-12-11 DIAGNOSIS — N186 End stage renal disease: Secondary | ICD-10-CM | POA: Diagnosis not present

## 2020-12-11 DIAGNOSIS — Z89611 Acquired absence of right leg above knee: Secondary | ICD-10-CM | POA: Diagnosis not present

## 2020-12-11 LAB — BASIC METABOLIC PANEL
Anion gap: 13 (ref 5–15)
BUN: 77 mg/dL — ABNORMAL HIGH (ref 8–23)
CO2: 24 mmol/L (ref 22–32)
Calcium: 8.7 mg/dL — ABNORMAL LOW (ref 8.9–10.3)
Chloride: 97 mmol/L — ABNORMAL LOW (ref 98–111)
Creatinine, Ser: 7.26 mg/dL — ABNORMAL HIGH (ref 0.44–1.00)
GFR, Estimated: 6 mL/min — ABNORMAL LOW (ref >60)
Glucose, Bld: 243 mg/dL — ABNORMAL HIGH (ref 70–99)
Potassium: 4.9 mmol/L (ref 3.5–5.1)
Sodium: 134 mmol/L — ABNORMAL LOW (ref 135–145)

## 2020-12-11 LAB — CBC
HCT: 24.7 % — ABNORMAL LOW (ref 36.0–46.0)
Hemoglobin: 7.7 g/dL — ABNORMAL LOW (ref 12.0–15.0)
MCH: 25.6 pg — ABNORMAL LOW (ref 26.0–34.0)
MCHC: 31.2 g/dL (ref 30.0–36.0)
MCV: 82.1 fL (ref 80.0–100.0)
Platelets: 647 10*3/uL — ABNORMAL HIGH (ref 150–400)
RBC: 3.01 MIL/uL — ABNORMAL LOW (ref 3.87–5.11)
RDW: 23.2 % — ABNORMAL HIGH (ref 11.5–15.5)
WBC: 43.9 10*3/uL — ABNORMAL HIGH (ref 4.0–10.5)
nRBC: 0.5 % — ABNORMAL HIGH (ref 0.0–0.2)

## 2020-12-11 LAB — GLUCOSE, CAPILLARY
Glucose-Capillary: 243 mg/dL — ABNORMAL HIGH (ref 70–99)
Glucose-Capillary: 235 mg/dL — ABNORMAL HIGH (ref 70–99)
Glucose-Capillary: 301 mg/dL — ABNORMAL HIGH (ref 70–99)
Glucose-Capillary: 222 mg/dL — ABNORMAL HIGH (ref 70–99)

## 2020-12-11 LAB — PHOSPHORUS: Phosphorus: 5.7 mg/dL — ABNORMAL HIGH (ref 2.5–4.6)

## 2020-12-11 MED ORDER — INSULIN ASPART 100 UNIT/ML IJ SOLN
5.0000 [IU] | Freq: Three times a day (TID) | INTRAMUSCULAR | Status: DC
  Administered 2020-12-11 – 2020-12-16 (×16): 5 [IU] via SUBCUTANEOUS

## 2020-12-11 MED ORDER — INSULIN DETEMIR 100 UNIT/ML ~~LOC~~ SOLN
20.0000 [IU] | Freq: Every day | SUBCUTANEOUS | Status: DC
  Administered 2020-12-11 – 2020-12-16 (×6): 20 [IU] via SUBCUTANEOUS
  Filled 2020-12-11 (×9): qty 0.2

## 2020-12-11 NOTE — Progress Notes (Signed)
Physical Therapy Wound Treatment Patient Details  Name: Christy Zhang MRN: [id number] Date of Birth: December 05, 1955  Today's Date: 12/11/2020 Time: 1100-1157 Time Calculation (min): 57 min  Subjective  Subjective Assessment Subjective: "Why are we doing this again?" Patient and Family Stated Goals: None stated Date of Onset:  (Unknown) Prior Treatments:  (Dressing changes)  Pain Score:  Premedication however still painful.   Wound Assessment  Pressure Injury 11/28/20 Sacrum Unstageable - Full thickness tissue loss in which the base of the injury is covered by slough (yellow, tan, gray, green or brown) and/or eschar (tan, brown or black) in the wound bed. (Active)  Dressing Type Barrier Film (skin prep);Foam - Lift dressing to assess site every shift;Gauze (Comment);Moist to moist;Santyl 12/11/20 1417  Dressing Changed;Clean;Dry;Intact 12/11/20 1417  Dressing Change Frequency Daily 12/11/20 1417  State of Healing Eschar 12/11/20 1417  Site / Wound Assessment Black;Yellow;Pink 12/11/20 1417  % Wound base Red or Granulating 35% 12/11/20 1417  % Wound base Yellow/Fibrinous Exudate 15% 12/11/20 1417  % Wound base Black/Eschar 50% 12/11/20 1417  % Wound base Other/Granulation Tissue (Comment) 0% 12/11/20 1417  Peri-wound Assessment Intact 12/11/20 1417  Wound Length (cm) 13 cm 12/06/20 1400  Wound Width (cm) 9 cm 12/06/20 1400  Wound Depth (cm) 2 cm 12/06/20 1400  Wound Surface Area (cm^2) 117 cm^2 12/06/20 1400  Wound Volume (cm^3) 234 cm^3 12/06/20 1400  Tunneling (cm) 0 12/06/20 1457  Undermining (cm) 2.5 cm at 9:00, 1.9 cm at 6:00 12/06/20 1400  Margins Unattached edges (unapproximated) 12/11/20 1417  Drainage Amount Minimal 12/11/20 1417  Drainage Description Serosanguineous;Green 12/11/20 1417  Treatment Debridement (Selective);Hydrotherapy (Pulse lavage);Packing (Saline gauze) 12/11/20 1417     Pressure Injury 11/28/20 Buttocks Left Unstageable - Full thickness tissue loss in  which the base of the injury is covered by slough (yellow, tan, gray, green or brown) and/or eschar (tan, brown or black) in the wound bed. (Active)  Dressing Type Barrier Film (skin prep);Foam - Lift dressing to assess site every shift;Gauze (Comment);Moist to moist;Santyl 12/11/20 1417  Dressing Changed;Clean;Dry;Intact 12/11/20 1417  Dressing Change Frequency Daily 12/11/20 1417  State of Healing Eschar 12/11/20 1417  Site / Wound Assessment Black;Yellow;Pink 12/11/20 1417  % Wound base Red or Granulating 5% 12/11/20 1417  % Wound base Yellow/Fibrinous Exudate 30% 12/11/20 1417  % Wound base Black/Eschar 65% 12/11/20 1417  % Wound base Other/Granulation Tissue (Comment) 0% 12/11/20 1417  Peri-wound Assessment Intact 12/11/20 1417  Wound Length (cm) 10.5 cm 12/06/20 1400  Wound Width (cm) 10.2 cm 12/06/20 1400  Wound Depth (cm) 1 cm 12/06/20 1400  Wound Surface Area (cm^2) 107.1 cm^2 12/06/20 1400  Wound Volume (cm^3) 107.1 cm^3 12/06/20 1400  Tunneling (cm) 0 12/06/20 1400  Undermining (cm) 0 12/06/20 1400  Margins Unattached edges (unapproximated) 12/11/20 1417  Drainage Amount Minimal 12/11/20 1417  Drainage Description Serosanguineous;Green 12/11/20 1417  Treatment Debridement (Selective);Hydrotherapy (Pulse lavage);Packing (Saline gauze) 12/11/20 1417     Pressure Injury 11/28/20 Flank Right Unstageable - Full thickness tissue loss in which the base of the injury is covered by slough (yellow, tan, gray, green or brown) and/or eschar (tan, brown or black) in the wound bed. (Active)  Dressing Type Barrier Film (skin prep);Foam - Lift dressing to assess site every shift;Gauze (Comment);Moist to moist;Santyl 12/11/20 1417  Dressing Changed;Clean;Dry;Intact 12/11/20 1417  Dressing Change Frequency Daily 12/11/20 1417  State of Healing Eschar 12/11/20 1417  Site / Wound Assessment Black;Yellow;Pink 12/11/20 1417  % Wound base Red or Granulating  10% 12/11/20 1417  % Wound base  Yellow/Fibrinous Exudate 60% 12/11/20 1417  % Wound base Black/Eschar 30% 12/11/20 1417  % Wound base Other/Granulation Tissue (Comment) 0% 12/11/20 1417  Peri-wound Assessment Intact 12/11/20 1417  Wound Length (cm) 7 cm 12/06/20 1400  Wound Width (cm) 23 cm 12/06/20 1400  Wound Depth (cm) 1 cm 12/06/20 1400  Wound Surface Area (cm^2) 161 cm^2 12/06/20 1400  Wound Volume (cm^3) 161 cm^3 12/06/20 1400  Tunneling (cm) 0 12/06/20 1457  Undermining (cm) 0 12/06/20 1457  Margins Unattached edges (unapproximated) 12/11/20 1417  Drainage Amount Minimal 12/11/20 1417  Drainage Description Serosanguineous;Green 12/11/20 1417  Treatment Debridement (Selective);Hydrotherapy (Pulse lavage);Packing (Saline gauze) 12/11/20 1417     Pressure Injury 11/28/20 Hip Right;Lateral Unstageable - Full thickness tissue loss in which the base of the injury is covered by slough (yellow, tan, gray, green or brown) and/or eschar (tan, brown or black) in the wound bed. (Active)  Dressing Type Barrier Film (skin prep);Foam - Lift dressing to assess site every shift;Gauze (Comment);Moist to moist;Santyl 12/11/20 1417  Dressing Changed;Clean;Dry;Intact 12/11/20 1417  Dressing Change Frequency Daily 12/11/20 1417  State of Healing Eschar 12/11/20 1417  Site / Wound Assessment Black;Pink;Yellow 12/11/20 1417  % Wound base Red or Granulating 10% 12/11/20 1417  % Wound base Yellow/Fibrinous Exudate 80% 12/11/20 1417  % Wound base Black/Eschar 10% 12/11/20 1417  % Wound base Other/Granulation Tissue (Comment) 0% 12/11/20 1417  Peri-wound Assessment Intact 12/11/20 1417  Wound Length (cm) 5 cm 12/06/20 1400  Wound Width (cm) 4.5 cm 12/06/20 1400  Wound Depth (cm) 1 cm 12/06/20 1400  Wound Surface Area (cm^2) 22.5 cm^2 12/06/20 1400  Wound Volume (cm^3) 22.5 cm^3 12/06/20 1400  Tunneling (cm) 0 12/06/20 1457  Undermining (cm) 0 12/06/20 1457  Margins Unattached edges (unapproximated) 12/11/20 1417  Drainage Amount  Scant 12/11/20 1417  Drainage Description Serosanguineous 12/11/20 1417  Treatment Debridement (Selective);Hydrotherapy (Pulse lavage);Packing (Saline gauze) 12/11/20 1417      Hydrotherapy Pulsed lavage therapy - wound location: Sacrum, R hip, L hip, and R flank Pulsed Lavage with Suction (psi): 8 psi Pulsed Lavage with Suction - Normal Saline Used: 2000 mL Pulsed Lavage Tip: Tip with splash shield Selective Debridement Selective Debridement - Location: Sacrum, R hip, L hip, and R flank Selective Debridement - Tools Used: Forceps, Scalpel, Scissors Selective Debridement - Tissue Removed: Black eschar and yellow unviable tissue    Wound Assessment and Plan  Wound Therapy - Assess/Plan/Recommendations Wound Therapy - Clinical Statement: Session limited due to pain. Wounds appear grossly unchanged. Plan to assess wounds with [facility] next session. Will continue to benefit from hydrotherapy for selective removal of unviable tissue, to decrease bioburden, and promote wound bed healing. Wound Therapy - Functional Problem List: Decreased tolerance for position changes and OOB Factors Delaying/Impairing Wound Healing: Diabetes Mellitus, Immobility, Multiple medical problems Hydrotherapy Plan: Debridement, Dressing change, Patient/family education, Pulsatile lavage with suction Wound Therapy - Frequency: 6X / week Wound Therapy - Follow Up Recommendations: dressing changes by RN  Wound Therapy Goals- Improve the function of patient's integumentary system by progressing the wound(s) through the phases of wound healing (inflammation - proliferation - remodeling) by: Wound Therapy Goals - Improve the function of patient's integumentary system by progressing the wound(s) through the phases of wound healing by: Decrease Necrotic Tissue to: 20% Decrease Necrotic Tissue - Progress: Progressing toward goal Increase Granulation Tissue to: 80% Increase Granulation Tissue - Progress: Progressing toward  goal Goals/treatment plan/discharge plan were made with and  agreed upon by patient/family: Yes Time For Goal Achievement: 7 days Wound Therapy - Potential for Goals: Fair  Goals will be updated until maximal potential achieved or discharge criteria met.  Discharge criteria: when goals achieved, discharge from hospital, MD decision/surgical intervention, no progress towards goals, refusal/missing three consecutive treatments without notification or medical reason.  GP     Charges PT Wound Care Charges $Wound Debridement up to 20 cm: < or equal to 20 cm $ Wound Debridement each add'l 20 sqcm: 9 $PT PLS Gun and Tip: 1 Supply $PT Hydrotherapy Visit: 1 Visit       Thelma Comp 12/11/2020, 2:37 PM  Rolinda Roan, PT, DPT Acute Rehabilitation Services Pager: [phone] Office: [phone]

## 2020-12-11 NOTE — Progress Notes (Signed)
Patient ID: Christy Zhang, female   DOB: 1956-04-22, 65 y.o.   MRN: [id number]  [facility] Progress Note   Assessment/ Plan:   1.  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  She underwent bilateral above knee amputations on 12/01/2020 and continues to recover from these.  She continues to have significant leukocytosis and currently off of antibiotics. 2. ESRD: Continue hemodialysis on a Monday/Wednesday/Friday schedule via left upper arm AV fistula with next hemodialysis ordered for t later today.  With her current wounds on the back/inability to sit in recliner, unable to undertake outpatient dialysis and will likely need to be placed in an LTAC. 3. Anemia: Secondary to end-stage renal disease as well as malnutrition-inflammation complex from multiple wounds on her back/limb gangrene.  On high-dose Aranesp. 4. CKD-MBD: Phosphorus level elevated with ongoing sevelamer, continue Hectorol for PTH control. 5. Nutrition: Albumin level significantly depressed secondary to multifocal sites of inflammation.  Continue ONS. 6.  Hypertension: Continue midodrine 3 times a day along with as needed albumin at hemodialysis to facilitate ultrafiltration.  Subjective:   Reports poor appetite this morning with some nausea and bilateral AKA site pain   Objective:   BP (!) 112/53 (BP Location: Left Arm)   Pulse 82   Temp 98.4 F (36.9 C) (Oral)   Resp 17   Ht '5\' 4"'$  (1.626 m)   Wt 81.2 kg   LMP  (LMP Unknown)   SpO2 97%   BMI 30.72 kg/m   Physical Exam: Gen: Appears uncomfortable resting in bed, uneaten breakfast tray CVS: Pulse regular rhythm, normal rate, S1 and S2 with ejection systolic  murmur Resp: Diminished breath sounds over bases, no rales/rhonchi Abd: Soft, obese, nontender Ext: Status post bilateral AKA, RUA AVF with intact dressings  Labs: BMET Recent Labs  Lab 12/17/2020 0404 12/06/20 0635 12/07/20 0803 12/09/20 0745 12/10/20 0640  NA 133* 133* 135 134* 132*  K 4.2 4.9 4.6 4.2 4.4  CL 96* 99 98 98 98  CO2 '25 22 23 26 '$ 21*  GLUCOSE 163* 111* 276* 226* 255*  BUN 35* 50* 41* 42* 59*  CREATININE 5.25* 6.62* 5.16* 4.31* 5.60*  CALCIUM 8.7* 8.7* 8.6* 8.3* 8.5*  PHOS 3.6  --   --   --   --    CBC Recent Labs  Lab 12/07/20 0803 12/07/20 2127 12/08/20 0315 12/09/20 0745 12/09/20 2122 12/10/20 0348  WBC 49.1*  --  45.6* 42.4*  --  41.5*  NEUTROABS  --   --  38.7* 37.7*  --   --   HGB 7.1*   < > 7.8* 7.0* 7.7* 7.9*  HCT 22.9*   < > 24.2* 21.9* 24.5* 25.1*  MCV 77.9*  --  77.6* 78.8*  --  81.2  PLT 705*  --  645* 634*  --  572*   < > = values in this interval not displayed.      Medications:     (feeding supplement) PROSource Plus  30 mL Oral BID BM   amiodarone  200 mg Oral Daily   apixaban  5 mg Oral BID   aspirin EC  81 mg Oral QHS   atorvastatin  40 mg Oral QPM   Chlorhexidine  Gluconate Cloth  6 each Topical Q0600   collagenase   Topical Daily   darbepoetin (ARANESP) injection - DIALYSIS  150 mcg Intravenous Q Mon-HD   doxercalciferol  1.5 mcg Intravenous Q M,W,F-HD   feeding supplement (NEPRO CARB STEADY)  237 mL Oral BID BM   gabapentin  300 mg Oral QHS   hydrocortisone  25 mg Rectal BID   insulin aspart  0-15 Units Subcutaneous TID WC   insulin aspart  5 Units Subcutaneous TID WC   insulin detemir  20 Units Subcutaneous QHS   midodrine  10 mg Oral TID WC   multivitamin  1 tablet Oral QHS   mupirocin ointment  1 application Nasal BID   polyethylene glycol  17 g Oral Daily   senna-docusate  1 tablet Oral BID   sevelamer carbonate  2,400 mg Oral TID with meals   sodium chloride flush  3 mL Intravenous Q12H   Elmarie Shiley, MD 12/11/2020,  10:02 AM

## 2020-12-11 NOTE — Progress Notes (Signed)
Patient ID: Christy Zhang, female   DOB: 03/24/1956, 65 y.o.   MRN: [id number]  PROGRESS NOTE    Christy Zhang  [id number] DOB: 08/22/1955 DOA: 12/17/2020 PCP: Claretta Fraise, MD   Brief Narrative:   89 old female with history of bilateral BKA, ESRD on dialysis,  type 2 diabetes, hypertension, peripheral vascular disease, hyperlipidemia, recently discharged from here on 11/14/2020 after prolonged hospitalization when she was admitted with altered mental status and had to be intubated for airway protection and also underwent CRRT in the setting of septic shock from pneumonia causing acute respiratory failure with hypoxia, AKI.  She presented at [facility] hospital ED because it was too painful to sit down for dialysis due to her decubitus ulcer and she also missed 2 session of dialysis.  Patient was sent to [facility] from [facility] for further management.  Nephrology,orthopedics,palliative care vascular surgery consulted.  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 -WBCs improving to 41.5 on 12/10/2020.  Labs pending for today.  Antibiotics discontinued on 12/07/2020 after discussion with vascular surgery. -Spoke with Dr. Alberteen Sam surgery on phone on 12/07/2020 who recommended to continue monitoring for now and if hand becomes more painful or changes into her gangrene, patient will need surgical intervention. -Spoke to daughter on phone on 12/07/2020 who was very concerned about her mother's hand because of decreased mobility and increasing pain. Dr. Grandville Silos spoke to the patient's daughter on 12/08/2020 on phone.  Currently no plans for hand  surgery.  Sacral and right hip decubitus ulcers: Present on admission -Currently undergoing hydrotherapy as per general surgery recommendations (did not recommend debridement).  Continue Santyl  End-stage renal disease Anemia of chronic disease Hyperkalemia: Resolved -Continue dialysis as per nephrology schedule. -she is unable to sit for dialysis and at this point we are not sure how she will receive outpt dialysis.  Will need LTAC as per nephrology -Hemoglobin currently stable.  Thrombocytosis -Possibly reactive.  Monitor  Mild hyponatremia -Nephrology managing by dialysis  Obesity-outpatient follow-up  Paroxysmal A. Fib -Continue amiodarone.  Currently on heparin since there was a question regarding possibility of patient undergoing hand surgery.    Left renal mass -MRI was attempted x4 but subsequently canceled  Diabetes mellitus type 2 -Continue Levemir and CBGs with SSI.  A1c 6.4 on 10/27/2020  Generalized deconditioning Goals of care -Overall prognosis is very poor.  Palliative care has evaluated the patient during this hospitalization.  Patient remains full code.   DVT prophylaxis: Eliquis  code Status: Full Family Communication: spoke to daughter on phone on 12/07/2020 Disposition Plan: Status is: Inpatient  Remains inpatient appropriate because:Inpatient level of care appropriate due to severity of illness  Dispo: The patient is from: Home              Anticipated d/c is to: LTAC once bed is available              Patient currently is not medically stable to d/c.   Difficult to place patient No    Consultants: Nephrology/vascular surgery/general surgery  Procedures: Bilateral AKA on 12/22/2020  Antimicrobials:  Anti-infectives (From admission, onward)    Start     Dose/Rate Route Frequency Ordered Stop   12/08/20 1200  vancomycin (VANCOCIN) IVPB 1000 mg/200 mL premix  Status:  Discontinued        1,000 mg 200 mL/hr over 60 Minutes Intravenous Every  M-W-F (Hemodialysis) 12/06/20 1138 12/07/20 1005   12/23/2020 0600  vancomycin (VANCOCIN) IVPB 1000 mg/200 mL premix        1,000 mg 200 mL/hr over 60 Minutes Intravenous To Surgery 12/04/20 1557 12/20/2020 1038   12/04/20 1200  vancomycin (VANCOCIN) IVPB 1000 mg/200 mL premix  Status:  Discontinued        1,000 mg 200 mL/hr over 60 Minutes Intravenous Every M-W-F (Hemodialysis) 12/02/20 1734 12/06/20 1138   12/03/20 1800  ceFEPIme (MAXIPIME) 1 g in sodium chloride 0.9 % 100 mL IVPB  Status:  Discontinued        1 g 200 mL/hr over 30 Minutes Intravenous Every 24 hours 12/02/20 1734 12/07/20 1005   12/02/20 1830  vancomycin (VANCOREADY) IVPB 1750 mg/350 mL        1,750 mg 175 mL/hr over 120 Minutes Intravenous  Once 12/02/20 1734 12/02/20 2156   12/02/20 1800  ceFEPIme (MAXIPIME) 2 g in sodium chloride 0.9 % 100 mL IVPB        2 g 200 mL/hr over 30 Minutes Intravenous  Once 12/02/20 1707 12/02/20 2003   12/02/20 1800  metroNIDAZOLE (FLAGYL) IVPB 500 mg  Status:  Discontinued        500 mg 100 mL/hr over 60 Minutes Intravenous Every 8 hours 12/02/20 1707 12/07/20 1005   12/02/20 1800  vancomycin (VANCOCIN) IVPB 1000 mg/200 mL premix  Status:  Discontinued        1,000 mg 200 mL/hr over 60 Minutes Intravenous  Once 12/02/20 1707 12/02/20 1734        Subjective: Patient seen and examined at bedside.  Poor historian.  No seizures, vomiting or fever reported per nursing staff.  Complains of pain in the back. Objective: Vitals:   12/10/20 0913 12/10/20 1820 12/10/20 2007 12/11/20 0514  BP: (!) 104/55 (!) 109/49 (!) 120/39 (!) 123/41  Pulse: 76 79 87 80  Resp: '16 17 16 16  '$ Temp: 99.4 F (37.4 C) 98.5 F (36.9 C) 98.6 F (37 C) 98.3 F (36.8 C)  TempSrc: Oral  Oral Oral  SpO2: 100% 100% 97% 98%  Weight:    81.2 kg  Height:       No intake or output data in the 24 hours ending 12/11/20 0829  Filed Weights   12/08/20 1500 12/09/20 2149 12/11/20 0514  Weight: 82.6 kg 81.2 kg 81.2 kg     Examination:  General exam: Looks chronically ill and deconditioned.  No distress.  On 2 L oxygen via nasal cannula.   Respiratory system: Decreased breath sounds at bases bilaterally with some crackles  cardiovascular system: Rate controlled, S1-S2 heard  gastrointestinal system: Abdomen is distended slightly, soft and nontender.  Bowel sounds are heard extremities: Bilateral AKA with dressing present  Central nervous system: More awake this morning, answers few questions.  Poor historian.  No obvious focal deficits.   Skin: Left hand gangrenous changes present  psychiatry: Mostly flat affect; intermittently gets anxious.  Data Reviewed: I have personally reviewed following labs and imaging studies  CBC: Recent Labs  Lab 12/06/20 0635 12/07/20 0803 12/07/20 2127 12/08/20 0315 12/09/20 0745 12/09/20 2122 12/10/20 0348  WBC 44.9* 49.1*  --  45.6* 42.4*  --  41.5*  NEUTROABS  --   --   --  38.7* 37.7*  --   --  HGB 8.1* 7.1* 8.0* 7.8* 7.0* 7.7* 7.9*  HCT 25.5* 22.9* 25.2* 24.2* 21.9* 24.5* 25.1*  MCV 76.3* 77.9*  --  77.6* 78.8*  --  81.2  PLT 734* 705*  --  645* 634*  --  572*    Basic Metabolic Panel: Recent Labs  Lab 12/14/2020 0404 12/06/20 0635 12/07/20 0803 12/09/20 0745 12/10/20 0640  NA 133* 133* 135 134* 132*  K 4.2 4.9 4.6 4.2 4.4  CL 96* 99 98 98 98  CO2 '25 22 23 26 '$ 21*  GLUCOSE 163* 111* 276* 226* 255*  BUN 35* 50* 41* 42* 59*  CREATININE 5.25* 6.62* 5.16* 4.31* 5.60*  CALCIUM 8.7* 8.7* 8.6* 8.3* 8.5*  PHOS 3.6  --   --   --   --     GFR: Estimated Creatinine Clearance: 10.6 mL/min (A) (by C-G formula based on SCr of 5.6 mg/dL (H)). Liver Function Tests: Recent Labs  Lab 11/30/2020 0404  ALBUMIN 1.9*    No results for input(s): LIPASE, AMYLASE in the last 168 hours. No results for input(s): AMMONIA in the last 168 hours. Coagulation Profile: No results for input(s): INR, PROTIME in the last 168 hours.  Cardiac Enzymes: No results for  input(s): CKTOTAL, CKMB, CKMBINDEX, TROPONINI in the last 168 hours. BNP (last 3 results) No results for input(s): PROBNP in the last 8760 hours. HbA1C: No results for input(s): HGBA1C in the last 72 hours. CBG: Recent Labs  Lab 12/10/20 0635 12/10/20 1034 12/10/20 1136 12/10/20 2156 12/11/20 0658  GLUCAP 250* 256* 250* 337* 243*    Lipid Profile: No results for input(s): CHOL, HDL, LDLCALC, TRIG, CHOLHDL, LDLDIRECT in the last 72 hours. Thyroid Function Tests: No results for input(s): TSH, T4TOTAL, FREET4, T3FREE, THYROIDAB in the last 72 hours. Anemia Panel: No results for input(s): VITAMINB12, FOLATE, FERRITIN, TIBC, IRON, RETICCTPCT in the last 72 hours. Sepsis Labs: No results for input(s): PROCALCITON, LATICACIDVEN in the last 168 hours.   Recent Results (from the past 240 hour(s))  Culture, blood (x 2)     Status: None   Collection Time: 12/02/20  6:34 PM   Specimen: BLOOD  Result Value Ref Range Status   Specimen Description BLOOD LEFT ANTECUBITAL  Final   Special Requests AEROBIC BOTTLE ONLY Blood Culture adequate volume  Final   Culture   Final    NO GROWTH 5 DAYS Performed at [facility], [address] [place], [place] [address]    Report Status 12/07/2020 FINAL  Final  Culture, blood (x 2)     Status: None   Collection Time: 12/02/20  6:34 PM   Specimen: BLOOD  Result Value Ref Range Status   Specimen Description BLOOD LEFT ANTECUBITAL  Final   Special Requests IN PEDIATRIC BOTTLE Blood Culture adequate volume  Final   Culture   Final    NO GROWTH 5 DAYS Performed at [facility] [address], [place], [place] [address]    Report Status 12/07/2020 FINAL  Final  Surgical pcr screen     Status: Abnormal   Collection Time: 12/04/2020  7:57 AM   Specimen: Nasal Mucosa; Nasal Swab  Result Value Ref Range Status   MRSA, PCR POSITIVE (A) NEGATIVE Final    Comment: RESULT CALLED TO, READ BACK BY AND VERIFIED WITH: RN S CLOTER [id number] AT 937 AM  BY CM    Staphylococcus aureus POSITIVE (A) NEGATIVE Final    Comment: (NOTE) The Xpert SA Assay (FDA approved for NASAL specimens  in patients 78 years of age and older), is one component of a comprehensive surveillance program. It is not intended to diagnose infection nor to guide or monitor treatment. Performed at [facility], [place] [address], [place], [place] [address]           Radiology Studies: No results found.      Scheduled Meds:  (feeding supplement) PROSource Plus  30 mL Oral BID BM   amiodarone  200 mg Oral Daily   apixaban  5 mg Oral BID   aspirin EC  81 mg Oral QHS   atorvastatin  40 mg Oral QPM   Chlorhexidine Gluconate Cloth  6 each Topical Q0600   Chlorhexidine Gluconate Cloth  6 each Topical Q0600   Chlorhexidine Gluconate Cloth  6 each Topical Q0600   Chlorhexidine Gluconate Cloth  6 each Topical Q0600   collagenase   Topical Daily   darbepoetin (ARANESP) injection - DIALYSIS  150 mcg Intravenous Q Mon-HD   doxercalciferol  1.5 mcg Intravenous Q M,W,F-HD   feeding supplement (NEPRO CARB STEADY)  237 mL Oral BID BM   gabapentin  300 mg Oral QHS   hydrocortisone  25 mg Rectal BID   insulin aspart  0-15 Units Subcutaneous TID WC   insulin detemir  16 Units Subcutaneous QHS   midodrine  10 mg Oral TID WC   multivitamin  1 tablet Oral QHS   mupirocin ointment  1 application Nasal BID   polyethylene glycol  17 g Oral Daily   senna-docusate  1 tablet Oral BID   sevelamer carbonate  2,400 mg Oral TID with meals   sodium chloride flush  3 mL Intravenous Q12H   Continuous Infusions:  sodium chloride     sodium chloride     sodium chloride 10 mL/hr at 12/04/20 0300          Aline August, MD [facility] 12/11/2020, 8:29 AM

## 2020-12-12 DIAGNOSIS — N186 End stage renal disease: Secondary | ICD-10-CM | POA: Diagnosis not present

## 2020-12-12 DIAGNOSIS — Z992 Dependence on renal dialysis: Secondary | ICD-10-CM | POA: Diagnosis not present

## 2020-12-12 DIAGNOSIS — I96 Gangrene, not elsewhere classified: Secondary | ICD-10-CM | POA: Diagnosis not present

## 2020-12-12 DIAGNOSIS — Z89611 Acquired absence of right leg above knee: Secondary | ICD-10-CM | POA: Diagnosis not present

## 2020-12-12 LAB — CBC
HCT: 22.3 % — ABNORMAL LOW (ref 36.0–46.0)
Hemoglobin: 7 g/dL — ABNORMAL LOW (ref 12.0–15.0)
MCH: 26.3 pg (ref 26.0–34.0)
MCHC: 31.4 g/dL (ref 30.0–36.0)
MCV: 83.8 fL (ref 80.0–100.0)
Platelets: 588 10*3/uL — ABNORMAL HIGH (ref 150–400)
RBC: 2.66 MIL/uL — ABNORMAL LOW (ref 3.87–5.11)
RDW: 23.3 % — ABNORMAL HIGH (ref 11.5–15.5)
WBC: 40.4 10*3/uL — ABNORMAL HIGH (ref 4.0–10.5)
nRBC: 0.3 % — ABNORMAL HIGH (ref 0.0–0.2)

## 2020-12-12 LAB — GLUCOSE, CAPILLARY
Glucose-Capillary: 242 mg/dL — ABNORMAL HIGH (ref 70–99)
Glucose-Capillary: 156 mg/dL — ABNORMAL HIGH (ref 70–99)
Glucose-Capillary: 130 mg/dL — ABNORMAL HIGH (ref 70–99)
Glucose-Capillary: 143 mg/dL — ABNORMAL HIGH (ref 70–99)
Glucose-Capillary: 202 mg/dL — ABNORMAL HIGH (ref 70–99)

## 2020-12-12 LAB — RENAL FUNCTION PANEL
Albumin: 1.5 g/dL — ABNORMAL LOW (ref 3.5–5.0)
Anion gap: 13 (ref 5–15)
BUN: 46 mg/dL — ABNORMAL HIGH (ref 8–23)
CO2: 26 mmol/L (ref 22–32)
Calcium: 8.1 mg/dL — ABNORMAL LOW (ref 8.9–10.3)
Chloride: 95 mmol/L — ABNORMAL LOW (ref 98–111)
Creatinine, Ser: 4.54 mg/dL — ABNORMAL HIGH (ref 0.44–1.00)
GFR, Estimated: 10 mL/min — ABNORMAL LOW (ref >60)
Glucose, Bld: 144 mg/dL — ABNORMAL HIGH (ref 70–99)
Phosphorus: 2.6 mg/dL (ref 2.5–4.6)
Potassium: 3.7 mmol/L (ref 3.5–5.1)
Sodium: 134 mmol/L — ABNORMAL LOW (ref 135–145)

## 2020-12-12 LAB — HEPATITIS B SURFACE ANTIGEN: Hepatitis B Surface Ag: NONREACTIVE

## 2020-12-12 MED ORDER — DARBEPOETIN ALFA 150 MCG/0.3ML IJ SOSY
150.0000 ug | PREFILLED_SYRINGE | Freq: Once | INTRAMUSCULAR | Status: DC
  Filled 2020-12-12 (×2): qty 0.3

## 2020-12-12 MED ORDER — DARBEPOETIN ALFA 150 MCG/0.3ML IJ SOSY
150.0000 ug | PREFILLED_SYRINGE | Freq: Once | INTRAMUSCULAR | Status: AC
  Administered 2020-12-12: 150 ug via SUBCUTANEOUS
  Filled 2020-12-12: qty 0.3

## 2020-12-12 MED ORDER — ALBUMIN HUMAN 25 % IV SOLN: 25.0000 g | Freq: Once | INTRAVENOUS | Status: AC

## 2020-12-12 MED ORDER — HYDROCORTISONE ACETATE 25 MG RE SUPP
25.0000 mg | Freq: Two times a day (BID) | RECTAL | Status: DC | PRN
  Filled 2020-12-12: qty 1

## 2020-12-12 MED ORDER — ALBUMIN HUMAN 25 % IV SOLN
INTRAVENOUS | Status: AC
  Administered 2020-12-12: 25 g via INTRAVENOUS
  Filled 2020-12-12: qty 100

## 2020-12-12 NOTE — Consult Note (Signed)
[place] Nurse wound follow up Patient receiving care in [facility] 5M11 Patient has multiple wounds on the sacrum, flank area and trochanter that will receive Hydrotherapy on M/W/F followed by Santyl and moistened saline gauze, dry gauze and foam dressing. These dressing changes are to be done by the bedside RN on dayshift every day except MWF. Dressing changes to be done by night shift every night.  The wounds were assessed today and these changes in the orders have been made.  Thank you for the consult. [place] nurse will follow weekly.  Please re-consult the [place] team if needed.  Cathlean Marseilles Tamala Julian, MSN, RN, [place], [place]le Pearl, [facility] Wound Treatment Associate Pager [phone]

## 2020-12-12 NOTE — Progress Notes (Signed)
Physical Therapy Wound Treatment Patient Details  Name: Christy Zhang MRN: [id number] Date of Birth: 08/07/1955  Today's Date: 12/12/2020 Time: 7253-6644 Time Calculation (min): 73 min  Subjective  Subjective Assessment Subjective: Pt was premedicated - alert but not verbalizing much Patient and Family Stated Goals: None stated Date of Onset:  (Unknown) Prior Treatments:  (Dressing changes)  Pain Score:  Premedicated however still reporting pain with treatment.   Wound Assessment  Pressure Injury 11/28/20 Sacrum Unstageable - Full thickness tissue loss in which the base of the injury is covered by slough (yellow, tan, gray, green or brown) and/or eschar (tan, brown or black) in the wound bed. (Active)  Dressing Type Barrier Film (skin prep);Foam - Lift dressing to assess site every shift;Gauze (Comment);Moist to moist;Santyl 12/12/20 1522  Dressing Changed;Clean;Dry;Intact 12/12/20 1522  Dressing Change Frequency Monday, Wednesday, Friday 12/12/20 1522  State of Healing Eschar 12/12/20 1522  Site / Wound Assessment Black;Pink;Yellow 12/12/20 1522  % Wound base Red or Granulating 35% 12/12/20 1522  % Wound base Yellow/Fibrinous Exudate 25% 12/12/20 1522  % Wound base Black/Eschar 40% 12/12/20 1522  % Wound base Other/Granulation Tissue (Comment) 0% 12/12/20 1522  Peri-wound Assessment Intact 12/12/20 1522  Wound Length (cm) 13 cm 12/06/20 1400  Wound Width (cm) 9 cm 12/06/20 1400  Wound Depth (cm) 2 cm 12/06/20 1400  Wound Surface Area (cm^2) 117 cm^2 12/06/20 1400  Wound Volume (cm^3) 234 cm^3 12/06/20 1400  Tunneling (cm) 0 12/06/20 1457  Undermining (cm) 2.5 cm at 9:00, 1.9 cm at 6:00 12/06/20 1400  Margins Unattached edges (unapproximated) 12/12/20 1522  Drainage Amount Minimal 12/12/20 1522  Drainage Description Serosanguineous 12/12/20 1522  Treatment Debridement (Selective);Hydrotherapy (Pulse lavage);Packing (Saline gauze) 12/12/20 1522     Pressure Injury 11/28/20  Buttocks Left Unstageable - Full thickness tissue loss in which the base of the injury is covered by slough (yellow, tan, gray, green or brown) and/or eschar (tan, brown or black) in the wound bed. (Active)  Dressing Type Barrier Film (skin prep);Foam - Lift dressing to assess site every shift;Gauze (Comment);Moist to moist;Santyl 12/12/20 1522  Dressing Changed;Clean;Dry;Intact 12/12/20 1522  Dressing Change Frequency Monday, Wednesday, Friday 12/12/20 1522  State of Healing Eschar 12/12/20 1522  Site / Wound Assessment Black;Pink;Yellow 12/12/20 1522  % Wound base Red or Granulating 10% 12/12/20 1522  % Wound base Yellow/Fibrinous Exudate 35% 12/12/20 1522  % Wound base Black/Eschar 55% 12/12/20 1522  % Wound base Other/Granulation Tissue (Comment) 0% 12/12/20 1522  Peri-wound Assessment Intact 12/12/20 1522  Wound Length (cm) 10.5 cm 12/06/20 1400  Wound Width (cm) 10.2 cm 12/06/20 1400  Wound Depth (cm) 1 cm 12/06/20 1400  Wound Surface Area (cm^2) 107.1 cm^2 12/06/20 1400  Wound Volume (cm^3) 107.1 cm^3 12/06/20 1400  Tunneling (cm) 0 12/06/20 1400  Undermining (cm) 0 12/06/20 1400  Margins Unattached edges (unapproximated) 12/12/20 1522  Drainage Amount Minimal 12/12/20 1522  Drainage Description Serosanguineous 12/12/20 1522  Treatment Debridement (Selective);Hydrotherapy (Pulse lavage);Packing (Saline gauze) 12/12/20 1522     Pressure Injury 11/28/20 Flank Right Unstageable - Full thickness tissue loss in which the base of the injury is covered by slough (yellow, tan, gray, green or brown) and/or eschar (tan, brown or black) in the wound bed. (Active)  Dressing Type Barrier Film (skin prep);Foam - Lift dressing to assess site every shift;Gauze (Comment);Moist to moist;Santyl 12/12/20 1522  Dressing Changed;Clean;Dry;Intact 12/12/20 1522  Dressing Change Frequency Monday, Wednesday, Friday 12/12/20 1522  State of Healing Eschar 12/12/20 1522  Site /  Wound Assessment  Black;Yellow;Pink 12/12/20 1522  % Wound base Red or Granulating 10% 12/12/20 1522  % Wound base Yellow/Fibrinous Exudate 50% 12/12/20 1522  % Wound base Black/Eschar 40% 12/12/20 1522  % Wound base Other/Granulation Tissue (Comment) 0% 12/12/20 1522  Peri-wound Assessment Intact 12/12/20 1522  Wound Length (cm) 7 cm 12/06/20 1400  Wound Width (cm) 23 cm 12/06/20 1400  Wound Depth (cm) 1 cm 12/06/20 1400  Wound Surface Area (cm^2) 161 cm^2 12/06/20 1400  Wound Volume (cm^3) 161 cm^3 12/06/20 1400  Tunneling (cm) 0 12/06/20 1457  Undermining (cm) 0 12/06/20 1457  Margins Unattached edges (unapproximated) 12/12/20 1522  Drainage Amount Minimal 12/12/20 1522  Drainage Description Serosanguineous 12/12/20 1522  Treatment Debridement (Selective);Hydrotherapy (Pulse lavage);Packing (Saline gauze) 12/12/20 1522     Pressure Injury 11/28/20 Hip Right;Lateral Unstageable - Full thickness tissue loss in which the base of the injury is covered by slough (yellow, tan, gray, green or brown) and/or eschar (tan, brown or black) in the wound bed. (Active)  Dressing Type Barrier Film (skin prep);Foam - Lift dressing to assess site every shift;Gauze (Comment);Moist to moist;Santyl 12/12/20 1522  Dressing Changed;Clean;Dry;Intact 12/12/20 1522  Dressing Change Frequency Monday, Wednesday, Friday 12/12/20 1522  State of Healing Eschar 12/12/20 1522  Site / Wound Assessment Black;Pink;Yellow 12/12/20 1522  % Wound base Red or Granulating 10% 12/12/20 1522  % Wound base Yellow/Fibrinous Exudate 80% 12/12/20 1522  % Wound base Black/Eschar 10% 12/12/20 1522  % Wound base Other/Granulation Tissue (Comment) 0% 12/12/20 1522  Peri-wound Assessment Intact 12/12/20 1522  Wound Length (cm) 5 cm 12/06/20 1400  Wound Width (cm) 4.5 cm 12/06/20 1400  Wound Depth (cm) 1 cm 12/06/20 1400  Wound Surface Area (cm^2) 22.5 cm^2 12/06/20 1400  Wound Volume (cm^3) 22.5 cm^3 12/06/20 1400  Tunneling (cm) 0 12/06/20 1457   Undermining (cm) 0 12/06/20 1457  Margins Unattached edges (unapproximated) 12/12/20 1522  Drainage Amount Minimal 12/12/20 1522  Drainage Description Serosanguineous 12/12/20 1522  Treatment Debridement (Selective);Hydrotherapy (Pulse lavage);Packing (Saline gauze) 12/12/20 1522      Hydrotherapy Pulsed lavage therapy - wound location: Sacrum, R hip, L hip, and R flank Pulsed Lavage with Suction (psi): 8 psi Pulsed Lavage with Suction - Normal Saline Used: 2000 mL Pulsed Lavage Tip: Tip with splash shield Selective Debridement Selective Debridement - Location: Sacrum, R hip, L hip, and R flank Selective Debridement - Tools Used: Forceps, Scalpel, Scissors Selective Debridement - Tissue Removed: Black eschar and yellow unviable tissue    Wound Assessment and Plan  Wound Therapy - Assess/Plan/Recommendations Wound Therapy - Clinical Statement: Assessed wound with Rosalio Loud, RN and agree with   decreased hydrotherapy frequency to MWF starting tomorrow, as the wounds have not significantly improved since beginning hydrotherapy to warrant a continuation of 6x/week frequency. This patient will continue to benefit from hydrotherapy for selective removal of unviable tissue, to decrease bioburden, and promote wound bed healing. Wound Therapy - Functional Problem List: Decreased tolerance for position changes and OOB Factors Delaying/Impairing Wound Healing: Diabetes Mellitus, Immobility, Multiple medical problems Hydrotherapy Plan: Debridement, Dressing change, Patient/family education, Pulsatile lavage with suction Wound Therapy - Frequency: 6X / week Wound Therapy - Follow Up Recommendations: dressing changes by RN  Wound Therapy Goals- Improve the function of patient's integumentary system by progressing the wound(s) through the phases of wound healing (inflammation - proliferation - remodeling) by: Wound Therapy Goals - Improve the function of patient's integumentary system by  progressing the wound(s) through the phases of wound healing  by: Decrease Necrotic Tissue to: 20% Decrease Necrotic Tissue - Progress: Progressing toward goal Increase Granulation Tissue to: 80% Increase Granulation Tissue - Progress: Progressing toward goal Goals/treatment plan/discharge plan were made with and agreed upon by patient/family: Yes Time For Goal Achievement: 7 days Wound Therapy - Potential for Goals: Fair  Goals will be updated until maximal potential achieved or discharge criteria met.  Discharge criteria: when goals achieved, discharge from hospital, MD decision/surgical intervention, no progress towards goals, refusal/missing three consecutive treatments without notification or medical reason.  GP     Charges PT Wound Care Charges $Wound Debridement up to 20 cm: < or equal to 20 cm $ Wound Debridement each add'l 20 sqcm: 9 $PT PLS Gun and Tip: 1 Supply $PT Hydrotherapy Visit: 1 Visit       Thelma Comp 12/12/2020, 3:33 PM  Rolinda Roan, PT, DPT Acute Rehabilitation Services Pager: [phone] Office: [phone]

## 2020-12-12 NOTE — TOC Progression Note (Signed)
Transition of Care [facility]) - Progression Note    Patient Details  Name: Christy Zhang MRN: [id number] Date of Birth: 1956-04-11  Transition of Care [facility]) CM/SW Contact  Pollie Friar, RN Phone Number: 12/12/2020, 1:30 PM  Clinical Narrative:    CM verified with LTACH's that she will not qualify for a LTACH stay under her medicare as she didn't have a 3 night ICU stay this admission.  CSW updated.    Expected Discharge Plan: Long Term Acute Care (LTAC) Barriers to Discharge: Continued Medical Work up  Expected Discharge Plan and Services Expected Discharge Plan: [place] (LTAC)                                               Social Determinants of Health (SDOH) Interventions    Readmission Risk Interventions Readmission Risk Prevention Plan 09/18/2020  Transportation Screening Complete  Medication Review ([place]) Complete  PCP or Specialist appointment within 3-5 days of discharge Complete  HRI or [place] Complete  SW Recovery Care/Counseling Consult Not Complete  [place] Not Applicable  Some recent data might be hidden

## 2020-12-12 NOTE — Progress Notes (Signed)
Patient ID: Christy Zhang, female   DOB: February 16, 1956, 65 y.o.   MRN: [id number]  PROGRESS NOTE    Christy Zhang  [id number] DOB: [birth year] DOA: 11/28/2020 PCP: Claretta Fraise, MD   Brief Narrative:   77 old female with history of bilateral BKA, ESRD on dialysis,  type 2 diabetes, hypertension, peripheral vascular disease, hyperlipidemia, recently discharged from here on 11/14/2020 after prolonged hospitalization when she was admitted with altered mental status and had to be intubated for airway protection and also underwent CRRT in the setting of septic shock from pneumonia causing acute respiratory failure with hypoxia, AKI.  She presented at [facility] hospital ED because it was too painful to sit down for dialysis due to her decubitus ulcer and she also missed 2 session of dialysis.  Patient was sent to [facility] for further management.  Nephrology,orthopedics,palliative care vascular surgery consulted.  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 -WBCs 43.9 on 12/10/2020.  Labs pending for today.  Antibiotics discontinued on 12/07/2020 after discussion with vascular surgery. -Spoke with Dr. Alberteen Sam surgery on phone on 12/07/2020 who recommended to continue monitoring for now and if hand becomes more painful or changes into her gangrene, patient will need surgical intervention. -Spoke to daughter on phone on 12/07/2020 who was very concerned about her mother's hand because of decreased mobility and increasing pain. Dr. Grandville Silos spoke to the patient's daughter on 12/08/2020 on phone.  Currently no plans for hand surgery.  Sacral and  right hip decubitus ulcers: Present on admission -Currently undergoing hydrotherapy as per general surgery recommendations (did not recommend debridement).  Continue Santyl  End-stage renal disease Anemia of chronic disease Hyperkalemia: Resolved -Continue dialysis as per nephrology schedule. -she is unable to sit for dialysis and at this point we are not sure how she will receive outpt dialysis.  Will need LTAC as per nephrology -Hemoglobin currently stable.  Thrombocytosis -Possibly reactive.  Monitor  Mild hyponatremia -Nephrology managing by dialysis  Obesity-outpatient follow-up  Paroxysmal A. Fib -Continue amiodarone.  Currently on heparin since there was a question regarding possibility of patient undergoing hand surgery.    Left renal mass -MRI was attempted x4 but subsequently canceled  Diabetes mellitus type 2 -Continue Levemir and CBGs with SSI.  A1c 6.4 on 10/27/2020  Generalized deconditioning Goals of care -Overall prognosis is very poor.  Palliative care has evaluated the patient during this hospitalization.  Patient remains full code.   DVT prophylaxis: Eliquis  code Status: Full Family Communication: spoke to daughter on phone on 12/07/2020 Disposition Plan: Status is: Inpatient  Remains inpatient appropriate because:Inpatient level of care appropriate due to severity of illness  Dispo: The patient is from: Home              Anticipated d/c is to: LTAC once bed is available              Patient currently is medically stable to d/c.   Difficult to place patient Yes    Consultants: Nephrology/vascular surgery/general surgery  Procedures: Bilateral AKA on 12/01/2020  Antimicrobials:  Anti-infectives (From admission, onward)    Start     Dose/Rate Route Frequency Ordered Stop   12/08/20 1200  vancomycin (VANCOCIN) IVPB  1000 mg/200 mL premix  Status:  Discontinued        1,000 mg 200 mL/hr over 60 Minutes Intravenous Every M-W-F (Hemodialysis) 12/06/20  1138 12/07/20 1005   12/13/2020 0600  vancomycin (VANCOCIN) IVPB 1000 mg/200 mL premix        1,000 mg 200 mL/hr over 60 Minutes Intravenous To Surgery 12/04/20 1557 12/26/2020 1038   12/04/20 1200  vancomycin (VANCOCIN) IVPB 1000 mg/200 mL premix  Status:  Discontinued        1,000 mg 200 mL/hr over 60 Minutes Intravenous Every M-W-F (Hemodialysis) 12/02/20 1734 12/06/20 1138   12/03/20 1800  ceFEPIme (MAXIPIME) 1 g in sodium chloride 0.9 % 100 mL IVPB  Status:  Discontinued        1 g 200 mL/hr over 30 Minutes Intravenous Every 24 hours 12/02/20 1734 12/07/20 1005   12/02/20 1830  vancomycin (VANCOREADY) IVPB 1750 mg/350 mL        1,750 mg 175 mL/hr over 120 Minutes Intravenous  Once 12/02/20 1734 12/02/20 2156   12/02/20 1800  ceFEPIme (MAXIPIME) 2 g in sodium chloride 0.9 % 100 mL IVPB        2 g 200 mL/hr over 30 Minutes Intravenous  Once 12/02/20 1707 12/02/20 2003   12/02/20 1800  metroNIDAZOLE (FLAGYL) IVPB 500 mg  Status:  Discontinued        500 mg 100 mL/hr over 60 Minutes Intravenous Every 8 hours 12/02/20 1707 12/07/20 1005   12/02/20 1800  vancomycin (VANCOCIN) IVPB 1000 mg/200 mL premix  Status:  Discontinued        1,000 mg 200 mL/hr over 60 Minutes Intravenous  Once 12/02/20 1707 12/02/20 1734        Subjective: Patient seen and examined at bedside.  Poor historian.  No fever, vomiting, worsening shortness of breath reported.   Objective: Vitals:   12/11/20 0932 12/11/20 1833 12/11/20 2139 12/12/20 0529  BP: (!) 112/53 (!) 120/41 (!) 149/34 (!) 108/41  Pulse: 82 81 79 78  Resp: '17 17 17 16  '$ Temp: 98.4 F (36.9 C) 98.2 F (36.8 C) 98.4 F (36.9 C) 98.5 F (36.9 C)  TempSrc: Oral Oral    SpO2: 97% 97%  100%  Weight:   81.2 kg   Height:        Intake/Output Summary (Last 24 hours) at 12/12/2020 0727 Last data filed at 12/12/2020 0529 Gross per 24 hour  Intake 0 ml  Output 0 ml  Net 0 ml    Filed Weights   12/09/20 2149 12/11/20 0514 12/11/20 2139   Weight: 81.2 kg 81.2 kg 81.2 kg    Examination:  General exam: Looks chronically ill and deconditioned.  Still on 2 L oxygen by nasal cannula.  No acute distress.   Respiratory system: Bilateral decreased breath sounds at bases with scattered crackles  cardiovascular system: S1-S2 heard, rate controlled  gastrointestinal system: Abdomen is mildly distended, soft and nontender.  Normal bowel sounds heard extremities: Dressing present over bilateral AKA  Central nervous system: Wakes up slightly, hardly participates in any conversation.  Poor historian.  No obvious focal deficits  Skin: Left hand gangrenous changes present  psychiatry: Extremely flat affect  Data Reviewed: I have personally reviewed following labs and imaging studies  CBC: Recent Labs  Lab 12/07/20 0803 12/07/20 2127 12/08/20 0315 12/09/20 0745 12/09/20 2122 12/10/20 0348 12/11/20 0914  WBC 49.1*  --  45.6* 42.4*  --  41.5* 43.9*  NEUTROABS  --   --  38.7*  37.7*  --   --   --   HGB 7.1*   < > 7.8* 7.0* 7.7* 7.9* 7.7*  HCT 22.9*   < > 24.2* 21.9* 24.5* 25.1* 24.7*  MCV 77.9*  --  77.6* 78.8*  --  81.2 82.1  PLT 705*  --  645* 634*  --  572* 647*   < > = values in this interval not displayed.    Basic Metabolic Panel: Recent Labs  Lab 12/06/20 0635 12/07/20 0803 12/09/20 0745 12/10/20 0640 12/11/20 0914  NA 133* 135 134* 132* 134*  K 4.9 4.6 4.2 4.4 4.9  CL 99 98 98 98 97*  CO2 '22 23 26 '$ 21* 24  GLUCOSE 111* 276* 226* 255* 243*  BUN 50* 41* 42* 59* 77*  CREATININE 6.62* 5.16* 4.31* 5.60* 7.26*  CALCIUM 8.7* 8.6* 8.3* 8.5* 8.7*  PHOS  --   --   --   --  5.7*    GFR: Estimated Creatinine Clearance: 8.2 mL/min (A) (by C-G formula based on SCr of 7.26 mg/dL (H)). Liver Function Tests: No results for input(s): AST, ALT, ALKPHOS, BILITOT, PROT, ALBUMIN in the last 168 hours.  No results for input(s): LIPASE, AMYLASE in the last 168 hours. No results for input(s): AMMONIA in the last 168  hours. Coagulation Profile: No results for input(s): INR, PROTIME in the last 168 hours.  Cardiac Enzymes: No results for input(s): CKTOTAL, CKMB, CKMBINDEX, TROPONINI in the last 168 hours. BNP (last 3 results) No results for input(s): PROBNP in the last 8760 hours. HbA1C: No results for input(s): HGBA1C in the last 72 hours. CBG: Recent Labs  Lab 12/11/20 0658 12/11/20 1158 12/11/20 1652 12/11/20 2141 12/12/20 0637  GLUCAP 243* 235* 301* 222* 242*    Lipid Profile: No results for input(s): CHOL, HDL, LDLCALC, TRIG, CHOLHDL, LDLDIRECT in the last 72 hours. Thyroid Function Tests: No results for input(s): TSH, T4TOTAL, FREET4, T3FREE, THYROIDAB in the last 72 hours. Anemia Panel: No results for input(s): VITAMINB12, FOLATE, FERRITIN, TIBC, IRON, RETICCTPCT in the last 72 hours. Sepsis Labs: No results for input(s): PROCALCITON, LATICACIDVEN in the last 168 hours.   Recent Results (from the past 240 hour(s))  Culture, blood (x 2)     Status: None   Collection Time: 12/02/20  6:34 PM   Specimen: BLOOD  Result Value Ref Range Status   Specimen Description BLOOD LEFT ANTECUBITAL  Final   Special Requests AEROBIC BOTTLE ONLY Blood Culture adequate volume  Final   Culture   Final    NO GROWTH 5 DAYS Performed at [facility], [address], [place], [place] [address]    Report Status 12/07/2020 FINAL  Final  Culture, blood (x 2)     Status: None   Collection Time: 12/02/20  6:34 PM   Specimen: BLOOD  Result Value Ref Range Status   Specimen Description BLOOD LEFT ANTECUBITAL  Final   Special Requests IN PEDIATRIC BOTTLE Blood Culture adequate volume  Final   Culture   Final    NO GROWTH 5 DAYS Performed at [place] [address], [place], [place] [address]    Report Status 12/07/2020 FINAL  Final  Surgical pcr screen     Status: Abnormal   Collection Time: 12/14/2020  7:57 AM   Specimen: Nasal Mucosa; Nasal Swab  Result Value Ref Range Status    MRSA, PCR POSITIVE (A) NEGATIVE Final    Comment: RESULT CALLED TO, READ BACK BY AND VERIFIED WITH:  RN Karie Chimera [phone] AT 71 AM BY CM    Staphylococcus aureus POSITIVE (A) NEGATIVE Final    Comment: (NOTE) The Xpert SA Assay (FDA approved for NASAL specimens in patients 61 years of age and older), is one component of a comprehensive surveillance program. It is not intended to diagnose infection nor to guide or monitor treatment. Performed at [facility], [place] [address] [place], [place] [address]           Radiology Studies: No results found.      Scheduled Meds:  (feeding supplement) PROSource Plus  30 mL Oral BID BM   amiodarone  200 mg Oral Daily   apixaban  5 mg Oral BID   aspirin EC  81 mg Oral QHS   atorvastatin  40 mg Oral QPM   Chlorhexidine Gluconate Cloth  6 each Topical Q0600   collagenase   Topical Daily   darbepoetin (ARANESP) injection - DIALYSIS  150 mcg Intravenous Q Mon-HD   doxercalciferol  1.5 mcg Intravenous Q M,W,F-HD   feeding supplement (NEPRO CARB STEADY)  237 mL Oral BID BM   gabapentin  300 mg Oral QHS   hydrocortisone  25 mg Rectal BID   insulin aspart  0-15 Units Subcutaneous TID WC   insulin aspart  5 Units Subcutaneous TID WC   insulin detemir  20 Units Subcutaneous QHS   midodrine  10 mg Oral TID WC   multivitamin  1 tablet Oral QHS   polyethylene glycol  17 g Oral Daily   senna-docusate  1 tablet Oral BID   sevelamer carbonate  2,400 mg Oral TID with meals   sodium chloride flush  3 mL Intravenous Q12H   Continuous Infusions:  sodium chloride     sodium chloride     sodium chloride 10 mL/hr at 12/04/20 0300          Aline August, MD [facility] 12/12/2020, 7:27 AM

## 2020-12-12 NOTE — Progress Notes (Signed)
   12/12/20 1344  Assess: MEWS Score  Temp 99.2 F (37.3 C)  BP (!) 102/34  Pulse Rate 81  Resp (!) 187  Level of Consciousness Alert  SpO2 100 %  O2 Device Nasal Cannula  O2 Flow Rate (L/min) 2 L/min  Assess: MEWS Score  MEWS Temp 0  MEWS Systolic 0  MEWS Pulse 0  MEWS RR 3  MEWS LOC 0  MEWS Score 3  MEWS Score Color Yellow  Assess: if the MEWS score is Yellow or Red  Were vital signs taken at a resting state? Yes  Focused Assessment Change from prior assessment (see assessment flowsheet)  Early Detection of Sepsis Score *See Row Information* Low  MEWS guidelines implemented *See Row Information* Yes  Treat  Pain Intervention(s) Medication (See eMAR) (medicated per physical thera py and wound nurse request for hydro therapy)  Take Vital Signs  Increase Vital Sign Frequency  Yellow: Q 2hr X 2 then Q 4hr X 2, if remains yellow, continue Q 4hrs  Notify: Charge Nurse/RN  Name of Charge Nurse/RN Notified Candace RN  Date Charge Nurse/RN Notified 12/12/20  Time Charge Nurse/RN Notified [id number]  Notify: Provider  Provider Name/Title Starla Link MD  Date Provider Notified 12/12/20  Time Provider Notified 1418  Notification Type Page  Notification Reason Change in status  Document  Patient Outcome Other (Comment) (continue to monitor)  Progress note created (see row info) Yes   Provider notified awaiting orders

## 2020-12-12 NOTE — Procedures (Signed)
Patient seen on Hemodialysis. BP (!) 108/41 (BP Location: Left Arm)   Pulse 78   Temp 98.5 F (36.9 C)   Resp 16   Ht '5\' 4"'$  (1.626 m)   Wt 81.2 kg   LMP  (LMP Unknown)   SpO2 100%   BMI 30.72 kg/m   QB 400, UF goal 2.5L Tolerating treatment without complaints at this time.   Elmarie Shiley MD [facility]. Office # [phone] Pager # [phone] 9:20 AM

## 2020-12-12 NOTE — Progress Notes (Signed)
Inpatient Diabetes Program Recommendations  AACE/ADA: New Consensus Statement on Inpatient Glycemic Control (2015)  Target Ranges:  Prepandial:   less than 140 mg/dL      Peak postprandial:   less than 180 mg/dL (1-2 hours)      Critically ill patients:  140 - 180 mg/dL   Lab Results  Component Value Date   GLUCAP 242 (H) 12/12/2020   HGBA1C 6.4 (H) 10/27/2020    Review of Glycemic Control Results for EVELIA, TOLLIS (MRN [id number]) as of 12/12/2020 12:53  Ref. Range 12/11/2020 11:58 12/11/2020 16:52 12/11/2020 21:41 12/12/2020 06:37  Glucose-Capillary Latest Ref Range: 70 - 99 mg/dL 235 (H) 301 (H) 222 (H) 242 (H)   Diabetes history: Type 2 DM Outpatient Diabetes medications: Levemir 16 units QD Current orders for Inpatient glycemic control: Novolog 0-15 units TID, Levemir 20 units QHS, Novolog 5 units TID  Inpatient Diabetes Program Recommendations:    Consider increasing Levemir to 24 units QHS.   Thanks, Bronson Curb, MSN, RNC-OB Diabetes Coordinator [phone] (8a-5p)

## 2020-12-13 DIAGNOSIS — I96 Gangrene, not elsewhere classified: Secondary | ICD-10-CM | POA: Diagnosis not present

## 2020-12-13 LAB — GLUCOSE, CAPILLARY
Glucose-Capillary: 204 mg/dL — ABNORMAL HIGH (ref 70–99)
Glucose-Capillary: 155 mg/dL — ABNORMAL HIGH (ref 70–99)
Glucose-Capillary: 270 mg/dL — ABNORMAL HIGH (ref 70–99)
Glucose-Capillary: 187 mg/dL — ABNORMAL HIGH (ref 70–99)

## 2020-12-13 LAB — BASIC METABOLIC PANEL
Anion gap: 11 (ref 5–15)
BUN: 69 mg/dL — ABNORMAL HIGH (ref 8–23)
CO2: 26 mmol/L (ref 22–32)
Calcium: 8.7 mg/dL — ABNORMAL LOW (ref 8.9–10.3)
Chloride: 97 mmol/L — ABNORMAL LOW (ref 98–111)
Creatinine, Ser: 5.44 mg/dL — ABNORMAL HIGH (ref 0.44–1.00)
GFR, Estimated: 8 mL/min — ABNORMAL LOW (ref >60)
Glucose, Bld: 209 mg/dL — ABNORMAL HIGH (ref 70–99)
Potassium: 4.7 mmol/L (ref 3.5–5.1)
Sodium: 134 mmol/L — ABNORMAL LOW (ref 135–145)

## 2020-12-13 LAB — CBC
HCT: 20.8 % — ABNORMAL LOW (ref 36.0–46.0)
Hemoglobin: 6.3 g/dL — CL (ref 12.0–15.0)
MCH: 25.5 pg — ABNORMAL LOW (ref 26.0–34.0)
MCHC: 30.3 g/dL (ref 30.0–36.0)
MCV: 84.2 fL (ref 80.0–100.0)
Platelets: 651 10*3/uL — ABNORMAL HIGH (ref 150–400)
RBC: 2.47 MIL/uL — ABNORMAL LOW (ref 3.87–5.11)
RDW: 22.7 % — ABNORMAL HIGH (ref 11.5–15.5)
WBC: 40.1 10*3/uL — ABNORMAL HIGH (ref 4.0–10.5)
nRBC: 0.2 % (ref 0.0–0.2)

## 2020-12-13 LAB — HEMOGLOBIN AND HEMATOCRIT, BLOOD
HCT: 24 % — ABNORMAL LOW (ref 36.0–46.0)
Hemoglobin: 7.5 g/dL — ABNORMAL LOW (ref 12.0–15.0)

## 2020-12-13 LAB — PREPARE RBC (CROSSMATCH)

## 2020-12-13 MED ORDER — PROSOURCE PLUS PO LIQD
30.0000 mL | Freq: Three times a day (TID) | ORAL | Status: DC
  Administered 2020-12-13 – 2020-12-16 (×9): 30 mL via ORAL
  Filled 2020-12-13 (×12): qty 30

## 2020-12-13 MED ORDER — NEPRO/CARBSTEADY PO LIQD
237.0000 mL | Freq: Three times a day (TID) | ORAL | Status: DC
  Administered 2020-12-13 – 2020-12-16 (×9): 237 mL via ORAL

## 2020-12-13 MED ORDER — SODIUM CHLORIDE 0.9% IV SOLUTION: Freq: Once | INTRAVENOUS | Status: AC

## 2020-12-13 NOTE — Progress Notes (Signed)
Nutrition Follow-up  DOCUMENTATION CODES:  Not applicable  INTERVENTION:  -Increase to Nepro Shake po TID, each supplement provides 425 kcal and 19 grams protein -Increase to PROSource PLUS PO 20ms TID, each supplement provides 100 kcals and 15 grams of protein -Continue renal MVI daily  NUTRITION DIAGNOSIS:  Increased nutrient needs related to chronic illness, wound healing (ESRD on HD) as evidenced by estimated needs. -- ongoing  GOAL:  Patient will meet greater than or equal to 90% of their needs -- progressing  MONITOR:  PO intake, Supplement acceptance, Weight trends, Labs, I & O's, Skin  REASON FOR ASSESSMENT:  Malnutrition Screening Tool   ASSESSMENT:  Pt with PMH significant for ESRD on HD, bilateral BKA, type 2 DM, HTN, PVD, HLD who was recently discharged from the hospital on 7/19 after prolonged hospitalization during which she had AMS, PNA w/ ARF, septic shock, AKI requiring CRRT; required intubation during previous hospitalization. Pt presented to [facility] ED due to 2x missed HD 2/2 inability to sit down for HD 2/2 pain from decub[facility]. Ad[facility] with gangrene of L hand.  8/09 s/p bilateral AKA 2/2 nonhealing wounds on bilateral BKA stumps  Surgery evaluated pt and, at this time, there are no plans for surgical intervention unless hand becomes more painful. Pt continues to undergo hydrotherapy for sacral and R hip decubitus ulcers (POA). Per MD, overall prognosis remains very poor. PMT evaluated pt. Pt remains full code.   Pt with very poor PO intake since last RD assessment; still sleeping through many meals. However, per RN, pt doing well with oral nutrition supplements (Nepro shakes and Prosource Plus). Will increase supplement frequency in hopes of improving kcal/protein intake.   PO Intake: 0-75% x last 8 recorded meals (20% average meal intake)  EDW 89.8 kg Admission weight: 86.2 kg Current weight: 80.7 kg  Last HD 8/16, net UF  1176m No UOP documented x24 hours I/O: [id number] admit   Medications: aranesp, hectorol, SSI, novolog, levemir, rena-vit, miralax, senokot-s, renvela Labs: Na 134 (L), Cr 5.44 (H), Hgb 6.3 (L) CBGs 143-202-204  Diet Order:   Diet Order             Diet renal/carb modified with fluid restriction Diet-HS Snack? Nothing; Fluid restriction: 1200 mL Fluid; Room service appropriate? Yes; Fluid consistency: Thin  Diet effective now                  EDUCATION NEEDS:  No education needs have been identified at this time  Skin:  Skin Assessment: Skin Integrity Issues: Skin Integrity Issues:: Unstageable, DTI, Incisions, Other (Comment) DTI: R breast, R flank Stage II: N/A Unstageable: acrum, L buttocks, lower vertebral column, L/R ischial tuberosity, lower R back, L knee, R flank, R hip Incisions: open incision groin, bilateral BKA Other: non-pressure wounds to R knee R arm, ischemic L hand/fingers  Last BM:  8/17 type 6  Height:  Ht Readings from Last 1 Encounters:  12/21/2020 '5\' 4"'$  (1.626 m)   Weight:  Wt Readings from Last 1 Encounters:  12/13/20 80.7 kg   BMI:  Body mass index is 30.55 kg/m.  Estimated Nutritional Needs:  Kcal:  2200-2400 Protein:  110-130 grams Fluid:  1L+UOP    AmaLarkin InaS, RD, LDN (she/her/hers) RD pager number and weekend/on-call pager number located in [place]

## 2020-12-13 NOTE — Progress Notes (Signed)
PROGRESS NOTE    RICHELL MAROTZ  [id number] DOB: 05-Aug-1955 DOA: 12/15/2020 PCP: Claretta Fraise, MD   Brief Narrative:  This 65 old female with history of bilateral BKA, ESRD on dialysis,  type 2 diabetes, hypertension, peripheral vascular disease, hyperlipidemia, recently discharged from [facility] on 11/14/2020 after prolonged hospitalization when she was admitted with altered mental status and had to be intubated for airway protection and also underwent CRRT in the setting of septic shock from pneumonia causing acute respiratory failure with hypoxia, AKI.  She presented at [facility] hospital ED because it was too painful to sit down for dialysis due to her decubitus ulcer and she also missed 2 session of dialysis.  Patient was sent to [facility] from [facility] for further management.  Nephrology,orthopedics, palliative care,  vascular surgery consulted.   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 Patient presented with nonhealing wounds on bilateral BKA stumps:  Vascular surgery following, status post bilateral AKA on 12/21/2020. WBCs 43.9 on 12/10/2020.  Antibiotics discontinued on 12/07/2020 after discussion with vascular surgery. Hospitalist spoke with Dr. Alberteen Sam surgery on phone on 12/07/2020 who recommended to continue monitoring for now and if hand becomes more painful or changes into gangrene, patient will need surgical intervention. Hospitalist spoke to daughter on phone on 12/07/2020 who was very concerned about her mother's hand because of decreased  mobility and increasing pain. Dr. Grandville Silos spoke to the patient's daughter on 12/08/2020 on phone.  Currently no plans for hand surgery.   Sacral and right hip decubitus ulcers: Present on admission Currently undergoing hydrotherapy as per general surgery recommendations (did not recommend debridement).  Continue Santyl   End-stage renal disease:  Continue dialysis as per nephrology schedule. Hyperkalemia: Resolved. She is unable to sit for dialysis and at this point we are not sure how she will receive outpt dialysis.  Will need LTAC as per nephrology  Anemia of chronic disease: Hb dropped from 7->6.3 Transfuse 1 PRBC,  monitor H&H,  keep hemoglobin above 7  Thrombocytosis Possibly reactive. Continue to monitor.  Mild hyponatremia Nephrology managing by dialysis   Obesity-outpatient follow-up   Paroxysmal A. Fib HR controlled. Continue amiodarone.  Currently on heparin since there was a question regarding possibility of patient undergoing hand surgery.     Left renal mass MRI was attempted x4 but subsequently canceled.   Diabetes mellitus type 2 Continue Levemir and CBGs with SSI.  A1c 6.4 on 10/27/2020  Generalized deconditioning Goals of care Overall prognosis is very poor.  Palliative care has evaluated the patient during this hospitalization.  Patient remains full code.   DVT prophylaxis: Eliquis Code Status: Full code. Family Communication: No family at bed side. Disposition Plan:   Status is: Inpatient  Remains inpatient appropriate because:Inpatient level of care appropriate due to severity of illness  Dispo:  Patient From:    Planned Disposition: LTACH  Medically stable for discharge: Yes     Consultants:  Nephro Vascular surgery  Procedures:  Antimicrobials:  Anti-infectives (From admission, onward)    Start     Dose/Rate Route Frequency Ordered Stop  12/08/20 1200  vancomycin (VANCOCIN) IVPB 1000 mg/200 mL premix  Status:  Discontinued         1,000 mg 200 mL/hr over 60 Minutes Intravenous Every M-W-F (Hemodialysis) 12/06/20 1138 12/07/20 1005   11/30/2020 0600  vancomycin (VANCOCIN) IVPB 1000 mg/200 mL premix        1,000 mg 200 mL/hr over 60 Minutes Intravenous To Surgery 12/04/20 1557 12/02/2020 1038   12/04/20 1200  vancomycin (VANCOCIN) IVPB 1000 mg/200 mL premix  Status:  Discontinued        1,000 mg 200 mL/hr over 60 Minutes Intravenous Every M-W-F (Hemodialysis) 12/02/20 1734 12/06/20 1138   12/03/20 1800  ceFEPIme (MAXIPIME) 1 g in sodium chloride 0.9 % 100 mL IVPB  Status:  Discontinued        1 g 200 mL/hr over 30 Minutes Intravenous Every 24 hours 12/02/20 1734 12/07/20 1005   12/02/20 1830  vancomycin (VANCOREADY) IVPB 1750 mg/350 mL        1,750 mg 175 mL/hr over 120 Minutes Intravenous  Once 12/02/20 1734 12/02/20 2156   12/02/20 1800  ceFEPIme (MAXIPIME) 2 g in sodium chloride 0.9 % 100 mL IVPB        2 g 200 mL/hr over 30 Minutes Intravenous  Once 12/02/20 1707 12/02/20 2003   12/02/20 1800  metroNIDAZOLE (FLAGYL) IVPB 500 mg  Status:  Discontinued        500 mg 100 mL/hr over 60 Minutes Intravenous Every 8 hours 12/02/20 1707 12/07/20 1005   12/02/20 1800  vancomycin (VANCOCIN) IVPB 1000 mg/200 mL premix  Status:  Discontinued        1,000 mg 200 mL/hr over 60 Minutes Intravenous  Once 12/02/20 1707 12/02/20 1734        Subjective: Patient is seen and examined at bedside.  Overnight events noted.  Patient is alert, arousable following some commands.  She has bilateral above-knee amputation, left hand is significantly gangrenous.  Objective: Vitals:   12/13/20 1019 12/13/20 1030 12/13/20 1109 12/13/20 1406  BP: (!) 111/46 (!) 125/52 119/67 (!) 125/51  Pulse: 79 84 80 80  Resp: '18 16 16 18  '$ Temp: 100.1 F (37.8 C) 100 F (37.8 C) 98.2 F (36.8 C) 99.5 F (37.5 C)  TempSrc: Oral Axillary Oral Oral  SpO2: 99% 99% 100% 100%  Weight:      Height:        Intake/Output Summary (Last 24 hours) at  12/13/2020 1413 Last data filed at 12/13/2020 1359 Gross per 24 hour  Intake 370 ml  Output --  Net 370 ml   Filed Weights   12/12/20 0825 12/12/20 1229 12/13/20 0533  Weight: 80.9 kg 79.8 kg 80.7 kg    Examination:  General exam: Appears chronically ill and deconditioned.  Not in any acute distress Respiratory system: Clear to auscultation bilaterally, respiratory effort normal RR 15 Cardiovascular system: S1 & S2 heard, RRR. No JVD, murmurs, rubs, gallops or clicks. No pedal edema. Gastrointestinal system: Abdomen is nondistended, soft and nontender. No organomegaly or masses felt. Normal bowel sounds heard. Central nervous system: Alert and oriented x1. No focal neurological deficits. Extremities: Bilateral AKI,  Staples noted at this time. Skin: Left hand gangrenous changes present Psychiatry: Flat affect.    Data Reviewed: I have personally reviewed following labs and imaging studies  CBC: Recent Labs  Lab 12/08/20 0315 12/09/20 0745 12/09/20 2122 12/10/20 0348 12/11/20 0914 12/12/20 1317 12/13/20 0524  WBC 45.6* 42.4*  --  41.5* 43.9* 40.4* 40.1*  NEUTROABS  38.7* 37.7*  --   --   --   --   --   HGB 7.8* 7.0* 7.7* 7.9* 7.7* 7.0* 6.3*  HCT 24.2* 21.9* 24.5* 25.1* 24.7* 22.3* 20.8*  MCV 77.6* 78.8*  --  81.2 82.1 83.8 84.2  PLT 645* 634*  --  572* 647* 588* [id number]*   Basic Metabolic Panel: Recent Labs  Lab 12/09/20 0745 12/10/20 0640 12/11/20 0914 12/12/20 0848 12/13/20 0524  NA 134* 132* 134* 134* 134*  K 4.2 4.4 4.9 3.7 4.7  CL 98 98 97* 95* 97*  CO2 26 21* '24 26 26  '$ GLUCOSE 226* 255* 243* 144* 209*  BUN 42* 59* 77* 46* 69*  CREATININE 4.31* 5.60* 7.26* 4.54* 5.44*  CALCIUM 8.3* 8.5* 8.7* 8.1* 8.7*  PHOS  --   --  5.7* 2.6  --    GFR: Estimated Creatinine Clearance: 10.9 mL/min (A) (by C-G formula based on SCr of 5.44 mg/dL (H)). Liver Function Tests: Recent Labs  Lab 12/12/20 0848  ALBUMIN 1.5*   No results for input(s): LIPASE, AMYLASE in the  last 168 hours. No results for input(s): AMMONIA in the last 168 hours. Coagulation Profile: No results for input(s): INR, PROTIME in the last 168 hours. Cardiac Enzymes: No results for input(s): CKTOTAL, CKMB, CKMBINDEX, TROPONINI in the last 168 hours. BNP (last 3 results) No results for input(s): PROBNP in the last 8760 hours. HbA1C: No results for input(s): HGBA1C in the last 72 hours. CBG: Recent Labs  Lab 12/12/20 1648 12/12/20 1705 12/12/20 2037 12/13/20 0641 12/13/20 1202  GLUCAP 130* 143* 202* 204* 155*   Lipid Profile: No results for input(s): CHOL, HDL, LDLCALC, TRIG, CHOLHDL, LDLDIRECT in the last 72 hours. Thyroid Function Tests: No results for input(s): TSH, T4TOTAL, FREET4, T3FREE, THYROIDAB in the last 72 hours. Anemia Panel: No results for input(s): VITAMINB12, FOLATE, FERRITIN, TIBC, IRON, RETICCTPCT in the last 72 hours. Sepsis Labs: No results for input(s): PROCALCITON, LATICACIDVEN in the last 168 hours.  Recent Results (from the past 240 hour(s))  Surgical pcr screen     Status: Abnormal   Collection Time: 12/21/2020  7:57 AM   Specimen: Nasal Mucosa; Nasal Swab  Result Value Ref Range Status   MRSA, PCR POSITIVE (A) NEGATIVE Final    Comment: RESULT CALLED TO, READ BACK BY AND VERIFIED WITH: RN S CLOTER [phone] AT 27 AM BY CM    Staphylococcus aureus POSITIVE (A) NEGATIVE Final    Comment: (NOTE) The Xpert SA Assay (FDA approved for NASAL specimens in patients 65 years of age and older), is one component of a comprehensive surveillance program. It is not intended to diagnose infection nor to guide or monitor treatment. Performed at [facility], [place] [address] [place], [place] [address]     Radiology Studies: No results found.  Scheduled Meds:  (feeding supplement) PROSource Plus  30 mL Oral TID BM   amiodarone  200 mg Oral Daily   apixaban  5 mg Oral BID   aspirin EC  81 mg Oral QHS   atorvastatin  40 mg Oral QPM   Chlorhexidine  Gluconate Cloth  6 each Topical Q0600   collagenase   Topical Daily   darbepoetin (ARANESP) injection - DIALYSIS  150 mcg Intravenous Q Mon-HD   doxercalciferol  1.5 mcg Intravenous Q M,W,F-HD   feeding supplement (NEPRO CARB STEADY)  237 mL Oral TID BM   gabapentin  300 mg Oral QHS   insulin aspart  0-15 Units Subcutaneous  TID WC   insulin aspart  5 Units Subcutaneous TID WC   insulin detemir  20 Units Subcutaneous QHS   midodrine  10 mg Oral TID WC   multivitamin  1 tablet Oral QHS   polyethylene glycol  17 g Oral Daily   senna-docusate  1 tablet Oral BID   sevelamer carbonate  2,400 mg Oral TID with meals   sodium chloride flush  3 mL Intravenous Q12H   Continuous Infusions:  sodium chloride     sodium chloride     sodium chloride 10 mL/hr at 12/04/20 0300     LOS: 16 days    Time spent: 35 mins  Chastin Garlitz, MD [facility]   If 7PM-7AM, please contact night-coverage

## 2020-12-13 NOTE — Progress Notes (Signed)
Physical Therapy Wound Treatment Patient Details  Name: Christy Zhang MRN: [id number] Date of Birth: 10-17-1955  Today's Date: 12/13/2020 Time: 5859-2924 Time Calculation (min): 88 min  Subjective  Subjective Assessment Subjective: Pt was premedicated - alert but not verbalizing much Patient and Family Stated Goals: None stated Date of Onset:  (Unknown) Prior Treatments:  (Dressing changes)  Pain Score:  4-6/10 premedicated  Wound Assessment  Pressure Injury 11/28/20 Sacrum Unstageable - Full thickness tissue loss in which the base of the injury is covered by slough (yellow, tan, gray, green or brown) and/or eschar (tan, brown or black) in the wound bed. (Active)  Wound Image   12/06/20 1457  Dressing Type Barrier Film (skin prep);Gauze (Comment);Moist to dry;Normal saline moist dressing;Santyl;Silicone dressing 46/28/63 1357  Dressing Changed;Dry;Intact 12/13/20 1357  Dressing Change Frequency Other (Comment) 12/13/20 1357  State of Healing Eschar 12/12/20 1522  Site / Wound Assessment Clean;Bleeding;Black;Brown;Pink;Yellow 12/13/20 1357  % Wound base Red or Granulating 30% 12/13/20 1357  % Wound base Yellow/Fibrinous Exudate 40% 12/13/20 1357  % Wound base Black/Eschar 30% 12/13/20 1357  % Wound base Other/Granulation Tissue (Comment) 0% 12/13/20 1357  Peri-wound Assessment Intact 12/13/20 1357  Wound Length (cm) 13 cm 12/06/20 1400  Wound Width (cm) 9 cm 12/06/20 1400  Wound Depth (cm) 2 cm 12/06/20 1400  Wound Surface Area (cm^2) 117 cm^2 12/06/20 1400  Wound Volume (cm^3) 234 cm^3 12/06/20 1400  Tunneling (cm) 0 12/06/20 1457  Undermining (cm) 2.5 cm at 9:00, 1.9 cm at 6:00 12/06/20 1400  Margins Unattached edges (unapproximated) 12/13/20 1357  Drainage Amount Moderate 12/13/20 1357  Drainage Description Serosanguineous 12/13/20 1357  Treatment Cleansed;Debridement (Selective);Hydrotherapy (Pulse lavage);Packing (Saline gauze) 12/13/20 1357     Pressure Injury  11/28/20 Buttocks Left Unstageable - Full thickness tissue loss in which the base of the injury is covered by slough (yellow, tan, gray, green or brown) and/or eschar (tan, brown or black) in the wound bed. (Active)  Wound Image   12/06/20 1457  Dressing Type Barrier Film (skin prep);Gauze (Comment);Moist to dry;Normal saline moist dressing;Santyl 12/13/20 1357  Dressing Changed;Clean;Dry;Intact 12/13/20 1357  Dressing Change Frequency Monday, Wednesday, Friday 12/13/20 1357  State of Healing Eschar 12/13/20 1357  Site / Wound Assessment Black;Brown;Yellow;Pink 12/13/20 1357  % Wound base Red or Granulating 10% 12/13/20 1357  % Wound base Yellow/Fibrinous Exudate 35% 12/13/20 1357  % Wound base Black/Eschar 55% 12/13/20 1357  % Wound base Other/Granulation Tissue (Comment) 0% 12/13/20 1357  Peri-wound Assessment Intact 12/13/20 1357  Wound Length (cm) 10.5 cm 12/06/20 1400  Wound Width (cm) 10.2 cm 12/06/20 1400  Wound Depth (cm) 1 cm 12/06/20 1400  Wound Surface Area (cm^2) 107.1 cm^2 12/06/20 1400  Wound Volume (cm^3) 107.1 cm^3 12/06/20 1400  Tunneling (cm) 0 12/06/20 1400  Undermining (cm) 0 12/06/20 1400  Margins Unattached edges (unapproximated) 12/13/20 1357  Drainage Amount Minimal 12/13/20 1357  Drainage Description Serosanguineous 12/13/20 1357  Treatment Cleansed;Debridement (Selective);Hydrotherapy (Pulse lavage);Packing (Saline gauze) 12/13/20 1357        Pressure Injury 11/28/20 Flank Right Unstageable - Full thickness tissue loss in which the base of the injury is covered by slough (yellow, tan, gray, green or brown) and/or eschar (tan, brown or black) in the wound bed. (Active)  Wound Image    12/06/20 1457  Dressing Type Barrier Film (skin prep);Gauze (Comment);Moist to dry;Normal saline moist dressing;Santyl;Silicone dressing 81/77/11 1357  Dressing Changed;Clean;Intact 12/13/20 1357  Dressing Change Frequency Monday, Wednesday, Friday 12/13/20 1357  State of Healing  Eschar 12/13/20 1357  Site / Wound Assessment Black;Brown;Pink;Yellow 12/13/20 1357  % Wound base Red or Granulating 10% 12/13/20 1357  % Wound base Yellow/Fibrinous Exudate 55% 12/13/20 1357  % Wound base Black/Eschar 35% 12/13/20 1357  % Wound base Other/Granulation Tissue (Comment) 0% 12/13/20 1357  Peri-wound Assessment Intact 12/13/20 1357  Wound Length (cm) 7 cm 12/06/20 1400  Wound Width (cm) 23 cm 12/06/20 1400  Wound Depth (cm) 1 cm 12/06/20 1400  Wound Surface Area (cm^2) 161 cm^2 12/06/20 1400  Wound Volume (cm^3) 161 cm^3 12/06/20 1400  Tunneling (cm) 0 12/06/20 1457  Undermining (cm) 0 12/06/20 1457  Margins Unattached edges (unapproximated) 12/13/20 1357  Drainage Amount Minimal 12/13/20 1357  Drainage Description Serosanguineous 12/13/20 1357  Treatment Cleansed;Debridement (Selective);Hydrotherapy (Pulse lavage);Packing (Saline gauze) 12/13/20 1357     Pressure Injury 11/28/20 Hip Right;Lateral Unstageable - Full thickness tissue loss in which the base of the injury is covered by slough (yellow, tan, gray, green or brown) and/or eschar (tan, brown or black) in the wound bed. (Active)  Wound Image   12/06/20 1457  Dressing Type Barrier Film (skin prep);Gauze (Comment);Moist to dry;Normal saline moist dressing;Santyl;Silicone dressing 83/15/17 1357  Dressing Changed;Clean;Dry;Intact 12/13/20 1357  Dressing Change Frequency Monday, Wednesday, Friday 12/13/20 1357  State of Healing Eschar 12/13/20 1357  Site / Wound Assessment Black;Brown;Pink;Yellow 12/13/20 1357  % Wound base Red or Granulating 10% 12/13/20 1357  % Wound base Yellow/Fibrinous Exudate 85% 12/13/20 1357  % Wound base Black/Eschar 5% 12/13/20 1357  % Wound base Other/Granulation Tissue (Comment) 0% 12/13/20 1357  Peri-wound Assessment Intact 12/13/20 1357  Wound Length (cm) 5 cm 12/06/20 1400  Wound Width (cm) 4.5 cm 12/06/20 1400  Wound Depth (cm) 1 cm 12/06/20 1400  Wound Surface Area (cm^2) 22.5 cm^2  12/06/20 1400  Wound Volume (cm^3) 22.5 cm^3 12/06/20 1400  Tunneling (cm) 0 12/06/20 1457  Undermining (cm) 0 12/06/20 1457  Margins Unattached edges (unapproximated) 12/13/20 1357  Drainage Amount Minimal 12/13/20 1357  Drainage Description Serosanguineous 12/13/20 1357  Treatment Cleansed;Debridement (Selective);Hydrotherapy (Pulse lavage);Packing (Saline gauze) 12/13/20 1357     Pressure Injury 11/28/20 Ischial tuberosity Left Unstageable - Full thickness tissue loss in which the base of the injury is covered by slough (yellow, tan, gray, green or brown) and/or eschar (tan, brown or black) in the wound bed. (Active)  Dressing Type None 12/08/20 0830  Dressing Clean 12/12/20 2056  Wound Length (cm) 1 cm 12/06/20 1000  Wound Width (cm) 3.9 cm 12/06/20 1000  Wound Surface Area (cm^2) 3.9 cm^2 12/06/20 1000  Treatment Cleansed 12/03/20 1500   Santyl applied to wound bed prior to applying dressing.    Hydrotherapy Pulsed lavage therapy - wound location: Sacrum, R hip, L hip, and R flank Pulsed Lavage with Suction (psi): 8 psi Pulsed Lavage with Suction - Normal Saline Used: 2000 mL Pulsed Lavage Tip: Tip with splash shield Selective Debridement Selective Debridement - Location: Sacrum, R hip, L hip, and R flank Selective Debridement - Tools Used: Forceps, Scalpel, Scissors Selective Debridement - Tissue Removed: Black eschar and yellow unviable tissue    Wound Assessment and Plan  Wound Therapy - Assess/Plan/Recommendations Wound Therapy - Clinical Statement: Agree with decreased hydrotherapy frequency to MWF, as the wounds have not significantly improved since beginning hydrotherapy to warrant a continuation of 6x/week frequency. This patient will continue to benefit from hydrotherapy for selective removal of unviable tissue, to decrease bioburden, and promote wound bed healing. Wound Therapy - Functional Problem List: Decreased tolerance for  position changes and OOB Factors  Delaying/Impairing Wound Healing: Diabetes Mellitus, Immobility, Multiple medical problems Hydrotherapy Plan: Debridement, Dressing change, Patient/family education, Pulsatile lavage with suction Wound Therapy - Frequency: 6X / week Wound Therapy - Follow Up Recommendations: dressing changes by RN  Wound Therapy Goals- Improve the function of patient's integumentary system by progressing the wound(s) through the phases of wound healing (inflammation - proliferation - remodeling) by: Wound Therapy Goals - Improve the function of patient's integumentary system by progressing the wound(s) through the phases of wound healing by: Decrease Necrotic Tissue to: 20% Decrease Necrotic Tissue - Progress: Progressing toward goal Increase Granulation Tissue to: 80% Increase Granulation Tissue - Progress: Progressing toward goal Goals/treatment plan/discharge plan were made with and agreed upon by patient/family: Yes Time For Goal Achievement: 7 days Wound Therapy - Potential for Goals: Fair  Goals will be updated until maximal potential achieved or discharge criteria met.  Discharge criteria: when goals achieved, discharge from hospital, MD decision/surgical intervention, no progress towards goals, refusal/missing three consecutive treatments without notification or medical reason.  GP     Charges PT Wound Care Charges $Wound Debridement up to 20 cm: < or equal to 20 cm $ Wound Debridement each add'l 20 sqcm: 9 $PT PLS Gun and Tip: 1 Supply $PT Hydrotherapy Visit: 2 Visits     12/13/2020  Ginger Carne., PT Acute Rehabilitation Services [phone]  (pager) [phone]  (office)  Tessie Fass [place] [facility] 12/13/2020, 2:06 PM

## 2020-12-13 NOTE — Progress Notes (Addendum)
Patient ID: Christy Zhang, female   DOB: 1955/05/18, 65 y.o.   MRN: [id number]  [facility] Progress Note   Assessment/ Plan:   1.  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  She underwent bilateral above knee amputations on 12/14/2020 and continues to recover from these.  She continues to have significant leukocytosis and currently off of antibiotics. 2. ESRD: She is usually on hemodialysis on a Monday/Wednesday/Friday schedule via left upper arm AV fistula and underwent hemodialysis yesterday-we will switch her to a TTS schedule for the rest of this week and reattempt transition to MWF schedule next week.  With her current wounds on the back/inability to sit in recliner, unable to undertake outpatient dialysis and will likely need to be placed in an LTAC. 3. Anemia: Secondary to end-stage renal disease as well as malnutrition-inflammation complex from multiple wounds on her back/limb gangrene.  On high-dose Aranesp and scheduled for PRBC transfusion today. 4. CKD-MBD: Phosphorus level elevated with ongoing sevelamer, continue Hectorol for PTH control. 5. Nutrition: Albumin level significantly depressed secondary to multifocal sites of inflammation.  Continue ONS. 6.  Hypertension: Continue midodrine 3 times a day along with as needed albumin at hemodialysis to facilitate ultrafiltration.  Subjective:   Reports that she is feeling tired this morning with some intermittent bilateral AKA stump discomfort/pain.   Objective:   BP (!) 116/47 (BP Location: Left Arm)   Pulse 77   Temp 97.8 F (36.6 C) (Oral)   Resp 17   Ht '5\' 4"'$  (1.626 m)   Wt 80.7 kg   LMP  (LMP Unknown)   SpO2 90%   BMI 30.55 kg/m    Physical Exam: Gen: Somnolent resting in bed, awakens easily to conversation CVS: Pulse regular rhythm, normal rate, S1 and S2 with ejection systolic murmur Resp: Diminished breath sounds over bases, no rales/rhonchi Abd: Soft, obese, nontender Ext: Status post bilateral AKA, RUA AVF with intact dressings  Labs: BMET Recent Labs  Lab 12/07/20 0803 12/09/20 0745 12/10/20 0640 12/11/20 0914 12/12/20 0848 12/13/20 0524  NA 135 134* 132* 134* 134* 134*  K 4.6 4.2 4.4 4.9 3.7 4.7  CL 98 98 98 97* 95* 97*  CO2 23 26 21* '24 26 26  '$ GLUCOSE 276* 226* 255* 243* 144* 209*  BUN 41* 42* 59* 77* 46* 69*  CREATININE 5.16* 4.31* 5.60* 7.26* 4.54* 5.44*  CALCIUM 8.6* 8.3* 8.5* 8.7* 8.1* 8.7*  PHOS  --   --   --  5.7* 2.6  --    CBC Recent Labs  Lab 12/08/20 0315 12/09/20 0745 12/09/20 2122 12/10/20 0348 12/11/20 0914 12/12/20 1317 12/13/20 0524  WBC 45.6* 42.4*  --  41.5* 43.9* 40.4* 40.1*  NEUTROABS 38.7* 37.7*  --   --   --   --   --   HGB 7.8* 7.0*   < > 7.9* 7.7* 7.0* 6.3*  HCT 24.2* 21.9*   < > 25.1* 24.7* 22.3* 20.8*  MCV 77.6* 78.8*  --  81.2 82.1 83.8 84.2  PLT 645* 634*  --  572* 647* 588* 651*   < > = values in this interval not displayed.      Medications:     (feeding supplement) PROSource Plus  30 mL Oral BID  BM   sodium chloride   Intravenous Once   amiodarone  200 mg Oral Daily   apixaban  5 mg Oral BID   aspirin EC  81 mg Oral QHS   atorvastatin  40 mg Oral QPM   Chlorhexidine Gluconate Cloth  6 each Topical Q0600   collagenase   Topical Daily   darbepoetin (ARANESP) injection - DIALYSIS  150 mcg Intravenous Q Mon-HD   doxercalciferol  1.5 mcg Intravenous Q M,W,F-HD   feeding supplement (NEPRO CARB STEADY)  237 mL Oral BID BM   gabapentin  300 mg Oral QHS   insulin aspart  0-15 Units Subcutaneous TID WC   insulin aspart  5 Units Subcutaneous TID WC   insulin detemir  20 Units Subcutaneous QHS   midodrine  10 mg Oral TID WC   multivitamin  1 tablet  Oral QHS   polyethylene glycol  17 g Oral Daily   senna-docusate  1 tablet Oral BID   sevelamer carbonate  2,400 mg Oral TID with meals   sodium chloride flush  3 mL Intravenous Q12H   Elmarie Shiley, MD 12/13/2020, 9:17 AM

## 2020-12-14 ENCOUNTER — Ambulatory Visit: Payer: Medicare Other | Admitting: Licensed Clinical Social Worker

## 2020-12-14 DIAGNOSIS — E1169 Type 2 diabetes mellitus with other specified complication: Secondary | ICD-10-CM

## 2020-12-14 DIAGNOSIS — M86279 Subacute osteomyelitis, unspecified ankle and foot: Secondary | ICD-10-CM

## 2020-12-14 DIAGNOSIS — I739 Peripheral vascular disease, unspecified: Secondary | ICD-10-CM

## 2020-12-14 DIAGNOSIS — S98132A Complete traumatic amputation of one left lesser toe, initial encounter: Secondary | ICD-10-CM

## 2020-12-14 DIAGNOSIS — IMO0002 Reserved for concepts with insufficient information to code with codable children: Secondary | ICD-10-CM

## 2020-12-14 DIAGNOSIS — I70268 Atherosclerosis of native arteries of extremities with gangrene, other extremity: Secondary | ICD-10-CM

## 2020-12-14 DIAGNOSIS — I480 Paroxysmal atrial fibrillation: Secondary | ICD-10-CM

## 2020-12-14 DIAGNOSIS — E66813 Obesity, class 3: Secondary | ICD-10-CM

## 2020-12-14 DIAGNOSIS — J4530 Mild persistent asthma, uncomplicated: Secondary | ICD-10-CM

## 2020-12-14 DIAGNOSIS — N186 End stage renal disease: Secondary | ICD-10-CM

## 2020-12-14 DIAGNOSIS — I96 Gangrene, not elsewhere classified: Secondary | ICD-10-CM | POA: Diagnosis not present

## 2020-12-14 DIAGNOSIS — D631 Anemia in chronic kidney disease: Secondary | ICD-10-CM

## 2020-12-14 DIAGNOSIS — E1122 Type 2 diabetes mellitus with diabetic chronic kidney disease: Secondary | ICD-10-CM

## 2020-12-14 LAB — MAGNESIUM: Magnesium: 2.3 mg/dL (ref 1.7–2.4)

## 2020-12-14 LAB — TYPE AND SCREEN
ABO/RH(D): B POS
Antibody Screen: NEGATIVE
Unit division: 0

## 2020-12-14 LAB — BASIC METABOLIC PANEL
Anion gap: 16 — ABNORMAL HIGH (ref 5–15)
BUN: 99 mg/dL — ABNORMAL HIGH (ref 8–23)
CO2: 20 mmol/L — ABNORMAL LOW (ref 22–32)
Calcium: 8.7 mg/dL — ABNORMAL LOW (ref 8.9–10.3)
Chloride: 94 mmol/L — ABNORMAL LOW (ref 98–111)
Creatinine, Ser: 6.28 mg/dL — ABNORMAL HIGH (ref 0.44–1.00)
GFR, Estimated: 7 mL/min — ABNORMAL LOW (ref >60)
Glucose, Bld: 225 mg/dL — ABNORMAL HIGH (ref 70–99)
Potassium: 5.1 mmol/L (ref 3.5–5.1)
Sodium: 130 mmol/L — ABNORMAL LOW (ref 135–145)

## 2020-12-14 LAB — CBC
HCT: 21 % — ABNORMAL LOW (ref 36.0–46.0)
Hemoglobin: 6.6 g/dL — CL (ref 12.0–15.0)
MCH: 26.6 pg (ref 26.0–34.0)
MCHC: 31.4 g/dL (ref 30.0–36.0)
MCV: 84.7 fL (ref 80.0–100.0)
Platelets: 620 10*3/uL — ABNORMAL HIGH (ref 150–400)
RBC: 2.48 MIL/uL — ABNORMAL LOW (ref 3.87–5.11)
RDW: 21.1 % — ABNORMAL HIGH (ref 11.5–15.5)
WBC: 41.5 10*3/uL — ABNORMAL HIGH (ref 4.0–10.5)
nRBC: 0.1 % (ref 0.0–0.2)

## 2020-12-14 LAB — BPAM RBC
Blood Product Expiration Date: 202209032359
ISSUE DATE / TIME: 202208171047
Unit Type and Rh: 7300

## 2020-12-14 LAB — PREPARE RBC (CROSSMATCH)

## 2020-12-14 LAB — PHOSPHORUS: Phosphorus: 5.2 mg/dL — ABNORMAL HIGH (ref 2.5–4.6)

## 2020-12-14 LAB — HEMOGLOBIN AND HEMATOCRIT, BLOOD
HCT: 21.8 % — ABNORMAL LOW (ref 36.0–46.0)
HCT: 21 % — ABNORMAL LOW (ref 36.0–46.0)
HCT: 26.7 % — ABNORMAL LOW (ref 36.0–46.0)
Hemoglobin: 6.8 g/dL — CL (ref 12.0–15.0)
Hemoglobin: 6.6 g/dL — CL (ref 12.0–15.0)
Hemoglobin: 8.7 g/dL — ABNORMAL LOW (ref 12.0–15.0)

## 2020-12-14 LAB — GLUCOSE, CAPILLARY: Glucose-Capillary: 230 mg/dL — ABNORMAL HIGH (ref 70–99)

## 2020-12-14 MED ORDER — SODIUM CHLORIDE 0.9% IV SOLUTION: Freq: Once | INTRAVENOUS | Status: DC

## 2020-12-14 MED ORDER — HEPARIN SODIUM (PORCINE) 1000 UNIT/ML DIALYSIS: 40.0000 [IU]/kg | INTRAMUSCULAR | Status: DC | PRN

## 2020-12-14 NOTE — Progress Notes (Signed)
PROGRESS NOTE    Christy Zhang  [id number] DOB: 03/22/1956 DOA: 12/18/2020 PCP: Claretta Fraise, MD   Brief Narrative:  This 65 old female with history of bilateral BKA, ESRD on dialysis,  type 2 diabetes, hypertension, peripheral vascular disease, hyperlipidemia, recently discharged from [facility] on 11/14/2020 after prolonged hospitalization when she was admitted with altered mental status and had to be intubated for airway protection and also underwent CRRT in the setting of septic shock from pneumonia causing acute respiratory failure with hypoxia, AKI.  She presented at [facility] hospital ED because it was too painful to sit down for dialysis due to her decubitus ulcer and she also missed 2 session of dialysis.  Patient was sent to [facility] from [facility] for further management.  Nephrology,orthopedics, palliative care,  vascular surgery consulted.   Assessment & Plan:   Principal Problem:   Gangrene of hand ([place]) Active Problems:   Obesity, Class III, BMI 40-49.9 (morbid obesity) (HCC)   S/P BKA (below knee amputation) bilateral (HCC)   Paroxysmal atrial fibrillation (HCC)   ESRD (end stage renal disease) on dialysis (HCC)   Gangrene of left hand  sec. to Left radial artery thrombotic occlusion: Status post thrombectomy of left carotid artery thrombotic occlusion:  Hand is still significantly gangrenous, with significant leucocytosis. Patient presented with nonhealing wounds on bilateral BKA stumps:  Vascular surgery following, status post bilateral AKA on 12/13/2020. WBCs 43.9 on 12/10/2020.  Antibiotics discontinued on 12/07/2020 after discussion with vascular surgery.  Hospitalist has spoke with Dr. Grandville Silos [facility] [place] [facility] on phone on 12/07/2020 who recommended to continue monitoring for now and if hand becomes more painful or changes into gangrene, patient will need surgical intervention. Hospitalist has spoken to the daughter on phone on 12/07/2020 who was very concerned about her mother's hand  because of decreased mobility and increasing pain. Dr. Grandville Silos spoke to the patient's daughter on 12/08/2020 on phone.  Currently no plans for hand surgery.   Sacral and right hip decubitus ulcers: Present on admission Currently undergoing hydrotherapy as per general surgery recommendations (did not recommend debridement).  Continue Santyl   End-stage renal disease:  Continue dialysis as per nephrology schedule. Hyperkalemia: Resolved. She is unable to sit for dialysis in the recliner with her physical limitations.  She will need LTAC for continued dialysis/wound care/physical therapy  Anemia of chronic disease: Hb dropped again to 6.6>6.8 Transfuse 1 PRBC,  monitor H&H,  keep hemoglobin above 7.  Thrombocytosis Possibly reactive. Continue to monitor.  Mild hyponatremia Nephrology managing by dialysis.   Obesity-outpatient follow-up   Paroxysmal A. Fib HR controlled. Continue amiodarone.   Was on heparin since there was a question regarding possibility of patient undergoing hand surgery.  Resumed on Eliquis.   Left renal mass MRI was attempted x4 but subsequently canceled.   Diabetes mellitus type 2 Continue Levemir and CBGs with SSI.   A1c 6.4 on 10/27/2020  Generalized deconditioning Goals of care: Overall prognosis is very poor.   Palliative care has evaluated the patient during this hospitalization.  Patient remains full code.   DVT prophylaxis: Eliquis Code Status: Full code. Family Communication: No family at bed side. Disposition Plan:   Status is: Inpatient  Remains inpatient appropriate because:Inpatient level of care appropriate due to severity of illness  Dispo:  Patient From:  HOME  Planned Disposition: [place]  Medically stable for discharge: Yes     Consultants:  Nephro Vascular surgery  Procedures:  Antimicrobials:  Anti-infectives (From admission, onward)  Start     Dose/Rate Route Frequency Ordered Stop   12/08/20 1200   vancomycin (VANCOCIN) IVPB 1000 mg/200 mL premix  Status:  Discontinued        1,000 mg 200 mL/hr over 60 Minutes Intravenous Every M-W-F (Hemodialysis) 12/06/20 1138 12/07/20 1005   12/04/2020 0600  vancomycin (VANCOCIN) IVPB 1000 mg/200 mL premix        1,000 mg 200 mL/hr over 60 Minutes Intravenous To Surgery 12/04/20 1557 12/02/2020 1038   12/04/20 1200  vancomycin (VANCOCIN) IVPB 1000 mg/200 mL premix  Status:  Discontinued        1,000 mg 200 mL/hr over 60 Minutes Intravenous Every M-W-F (Hemodialysis) 12/02/20 1734 12/06/20 1138   12/03/20 1800  ceFEPIme (MAXIPIME) 1 g in sodium chloride 0.9 % 100 mL IVPB  Status:  Discontinued        1 g 200 mL/hr over 30 Minutes Intravenous Every 24 hours 12/02/20 1734 12/07/20 1005   12/02/20 1830  vancomycin (VANCOREADY) IVPB 1750 mg/350 mL        1,750 mg 175 mL/hr over 120 Minutes Intravenous  Once 12/02/20 1734 12/02/20 2156   12/02/20 1800  ceFEPIme (MAXIPIME) 2 g in sodium chloride 0.9 % 100 mL IVPB        2 g 200 mL/hr over 30 Minutes Intravenous  Once 12/02/20 1707 12/02/20 2003   12/02/20 1800  metroNIDAZOLE (FLAGYL) IVPB 500 mg  Status:  Discontinued        500 mg 100 mL/hr over 60 Minutes Intravenous Every 8 hours 12/02/20 1707 12/07/20 1005   12/02/20 1800  vancomycin (VANCOCIN) IVPB 1000 mg/200 mL premix  Status:  Discontinued        1,000 mg 200 mL/hr over 60 Minutes Intravenous  Once 12/02/20 1707 12/02/20 1734        Subjective: Patient was seen and examined at bedside.  No overnight events.  Patient is alert, arousable,  following commands. She has bilateral above-knee amputation.  Left hand is significantly gangrenous,  she is able to move fingers in the left hand.  Objective: Vitals:   12/13/20 1814 12/13/20 2052 12/14/20 0526 12/14/20 1057  BP: (!) 135/55 (!) 126/56 (!) 102/25 (!) 153/52  Pulse: 75 79 79 79  Resp: '16 14 18 15  '$ Temp: 98.6 F (37 C) 98.7 F (37.1 C) 98.3 F (36.8 C) 98.2 F (36.8 C)  TempSrc: Oral  Oral Oral Oral  SpO2: 100% 99% 98%   Weight:      Height:        Intake/Output Summary (Last 24 hours) at 12/14/2020 1158 Last data filed at 12/13/2020 2200 Gross per 24 hour  Intake 744.5 ml  Output --  Net 744.5 ml   Filed Weights   12/12/20 0825 12/12/20 1229 12/13/20 0533  Weight: 80.9 kg 79.8 kg 80.7 kg    Examination:  General exam: Appears chronically ill and deconditioned.  Not in any distress. Respiratory system: Clear to auscultation bilaterally, respiratory rate 15 Cardiovascular system: S1-S2 heard, regular rate and rhythm, no murmur Gastrointestinal system: Abdomen is soft, nontender, nondistended.  Normal bowel sounds heard.   Central nervous system: Alert and oriented x 3, no focal neurological deficits. Extremities: Bilateral AKA, staples noted at the site. Skin: Left hand significantly gangrenous, able to move fingers,  Psychiatry: Flat affect, oriented x3    Data Reviewed: I have personally reviewed following labs and imaging studies  CBC: Recent Labs  Lab 12/08/20 0315 12/09/20 0745 12/09/20 2122 12/10/20 0348 12/11/20 [id number]  12/12/20 1317 12/13/20 0524 12/13/20 1616 12/14/20 0350 12/14/20 0700  WBC 45.6* 42.4*  --  41.5* 43.9* 40.4* 40.1*  --  41.5*  --   NEUTROABS 38.7* 37.7*  --   --   --   --   --   --   --   --   HGB 7.8* 7.0*   < > 7.9* 7.7* 7.0* 6.3* 7.5* 6.6* 6.8*  HCT 24.2* 21.9*   < > 25.1* 24.7* 22.3* 20.8* 24.0* 21.0* 21.8*  MCV 77.6* 78.8*  --  81.2 82.1 83.8 84.2  --  84.7  --   PLT 645* 634*  --  572* 647* 588* 651*  --  620*  --    < > = values in this interval not displayed.   Basic Metabolic Panel: Recent Labs  Lab 12/10/20 0640 12/11/20 0914 12/12/20 0848 12/13/20 0524 12/14/20 0350  NA 132* 134* 134* 134* 130*  K 4.4 4.9 3.7 4.7 5.1  CL 98 97* 95* 97* 94*  CO2 21* '24 26 26 '$ 20*  GLUCOSE 255* 243* 144* 209* 225*  BUN 59* 77* 46* 69* 99*  CREATININE 5.60* 7.26* 4.54* 5.44* 6.28*  CALCIUM 8.5* 8.7* 8.1* 8.7* 8.7*  MG   --   --   --   --  2.3  PHOS  --  5.7* 2.6  --  5.2*   GFR: Estimated Creatinine Clearance: 9.4 mL/min (A) (by C-G formula based on SCr of 6.28 mg/dL (H)). Liver Function Tests: Recent Labs  Lab 12/12/20 0848  ALBUMIN 1.5*   No results for input(s): LIPASE, AMYLASE in the last 168 hours. No results for input(s): AMMONIA in the last 168 hours. Coagulation Profile: No results for input(s): INR, PROTIME in the last 168 hours. Cardiac Enzymes: No results for input(s): CKTOTAL, CKMB, CKMBINDEX, TROPONINI in the last 168 hours. BNP (last 3 results) No results for input(s): PROBNP in the last 8760 hours. HbA1C: No results for input(s): HGBA1C in the last 72 hours. CBG: Recent Labs  Lab 12/13/20 0641 12/13/20 1202 12/13/20 1624 12/13/20 2051 12/14/20 1055  GLUCAP 204* 155* 270* 187* 230*   Lipid Profile: No results for input(s): CHOL, HDL, LDLCALC, TRIG, CHOLHDL, LDLDIRECT in the last 72 hours. Thyroid Function Tests: No results for input(s): TSH, T4TOTAL, FREET4, T3FREE, THYROIDAB in the last 72 hours. Anemia Panel: No results for input(s): VITAMINB12, FOLATE, FERRITIN, TIBC, IRON, RETICCTPCT in the last 72 hours. Sepsis Labs: No results for input(s): PROCALCITON, LATICACIDVEN in the last 168 hours.  Recent Results (from the past 240 hour(s))  Surgical pcr screen     Status: Abnormal   Collection Time: 12/12/2020  7:57 AM   Specimen: Nasal Mucosa; Nasal Swab  Result Value Ref Range Status   MRSA, PCR POSITIVE (A) NEGATIVE Final    Comment: RESULT CALLED TO, READ BACK BY AND VERIFIED WITH: RN S CLOTER [id number] AT 3 AM BY CM    Staphylococcus aureus POSITIVE (A) NEGATIVE Final    Comment: (NOTE) The Xpert SA Assay (FDA approved for NASAL specimens in patients 23 years of age and older), is one component of a comprehensive surveillance program. It is not intended to diagnose infection nor to guide or monitor treatment. Performed at [facility], [place] [address]  [place], [place] [address]     Radiology Studies: No results found.  Scheduled Meds:  (feeding supplement) PROSource Plus  30 mL Oral TID BM   sodium chloride   Intravenous Once   amiodarone  200 mg Oral Daily   apixaban  5 mg Oral BID   aspirin EC  81 mg Oral QHS   atorvastatin  40 mg Oral QPM   Chlorhexidine Gluconate Cloth  6 each Topical Q0600   collagenase   Topical Daily   darbepoetin (ARANESP) injection - DIALYSIS  150 mcg Intravenous Q Mon-HD   doxercalciferol  1.5 mcg Intravenous Q M,W,F-HD   feeding supplement (NEPRO CARB STEADY)  237 mL Oral TID BM   gabapentin  300 mg Oral QHS   insulin aspart  0-15 Units Subcutaneous TID WC   insulin aspart  5 Units Subcutaneous TID WC   insulin detemir  20 Units Subcutaneous QHS   midodrine  10 mg Oral TID WC   multivitamin  1 tablet Oral QHS   polyethylene glycol  17 g Oral Daily   senna-docusate  1 tablet Oral BID   sevelamer carbonate  2,400 mg Oral TID with meals   sodium chloride flush  3 mL Intravenous Q12H   Continuous Infusions:  sodium chloride     sodium chloride     sodium chloride 10 mL/hr at 12/04/20 0300     LOS: 17 days    Time spent: 35 mins  Toddrick Sanna, MD [facility]   If 7PM-7AM, please contact night-coverage

## 2020-12-14 NOTE — Procedures (Signed)
Patient seen on Hemodialysis. BP (!) 153/52 (BP Location: Left Arm)   Pulse 79   Temp 98.2 F (36.8 C) (Oral)   Resp 15   Ht '5\' 4"'$  (1.626 m)   Wt 80.7 kg   LMP  (LMP Unknown)   SpO2 98%   BMI 30.55 kg/m   QB 400, UF goal 3L Tolerating treatment without complaints at this time.   Elmarie Shiley MD [facility]. Office # [phone] Pager # [phone] 2:27 PM

## 2020-12-14 NOTE — Patient Instructions (Signed)
Visit Information  PATIENT GOALS:  Goals Addressed             This Visit's Progress    Manage My Emotions       Protect My Health;Manage medical conditions faced       Timeframe:  Short-Term Goal Priority:  High Progress: On Track Start Date:      12/14/20         Expected End Date:    03/14/21                Follow Up Date 01/24/21   Protect My Health (Patient). Patient needs to learn coping skills to help her manage medical conditions faced .  Why is this important?   Screening tests can find diseases early when they are easier to treat.  Your doctor or nurse will talk with you about which tests are important for you.  Getting shots for common diseases like the flu and shingles will help prevent them.     Patient Strengths: Has support from daughter, Ledon Snare of client is important to client Client is strong advocate for herself  Patient Deficits: Challenges with Managing Diabetes Challenges with Completing Dialysis Treatment weekly  Patient Goals:   Patient to attend scheduled medical appointments in next 30 days Patient to take medications as prescribed in next 30 days Patient to communicate with RNCM or LCSW as needed for support in next 30 days Patient to communicate regularly with daughter, [name], for support in next 30 days Patient to practice self care and allow time to rest and relax in next 30 days -  Follow Up Plan: LCSW to call client or daughter, [name], on 01/24/21 to assess client needs     Norva Riffle.Marshell Dilauro MSW, LCSW Licensed Clinical Social Worker [facility] Care Management [phone]

## 2020-12-14 NOTE — Progress Notes (Signed)
  Critical Value: hemoglobin 6.6 g/dL  Name of Provider Notified: MD Sidney Ace  Time notified: 05:30am  Orders given? : yes

## 2020-12-14 NOTE — Chronic Care Management (AMB) (Signed)
Chronic Care Management    Clinical Social Work Note  12/14/2020 Name: Christy Zhang MRN: [id number] DOB: 06-16-1955  Christy Zhang is a 65 y.o. year old female who is a primary care patient of [name], [name], MD. The CCM team was consulted to assist the patient with chronic disease management and/or care coordination needs related to: [name] .   Engaged with patient /daughter of patient, Christy Zhang, by telephone for follow up visit in response to provider referral for social work chronic care management and care coordination services.   Consent to Services:  The patient was given information about Chronic Care Management services, agreed to services, and gave verbal consent prior to initiation of services.  Please see initial visit note for detailed documentation.   Patient agreed to services and consent obtained.   Assessment: Review of patient past medical history, allergies, medications, and health status, including review of relevant consultants reports was performed today as part of a comprehensive evaluation and provision of chronic care management and care coordination services.     SDOH (Social Determinants of Health) assessments and interventions performed:  SDOH Interventions    Flowsheet Row Most Recent Value  SDOH Interventions   Physical Activity Interventions Other (Comments)  [hospitalized with bilateral AKA . Surgery was done on 12/04/2020]  Depression Interventions/Treatment  Counseling        Advanced Directives Status: See Vynca application for related entries.  CCM Care Plan  Allergies  Allergen Reactions   Sulfa Antibiotics Swelling    Facility-Administered Encounter Medications as of 12/14/2020  Medication   (feeding supplement) PROSource Plus liquid 30 mL   0.9 %  sodium chloride infusion (Manually program via Guardrails IV Fluids)   0.9 %  sodium chloride infusion   0.9 %  sodium chloride infusion   0.9 %  sodium chloride  infusion   acetaminophen (TYLENOL) tablet 650 mg   Or   acetaminophen (TYLENOL) suppository 650 mg   alteplase (CATHFLO ACTIVASE) injection 2 mg   amiodarone (PACERONE) tablet 200 mg   apixaban (ELIQUIS) tablet 5 mg   aspirin EC tablet 81 mg   atorvastatin (LIPITOR) tablet 40 mg   Chlorhexidine Gluconate Cloth 2 % PADS 6 each   collagenase (SANTYL) ointment   Darbepoetin Alfa (ARANESP) injection 150 mcg   diphenhydrAMINE (BENADRYL) capsule 25 mg   doxercalciferol (HECTOROL) injection 1.5 mcg   feeding supplement (NEPRO CARB STEADY) liquid 237 mL   gabapentin (NEURONTIN) capsule 300 mg   heparin injection 3,200 Units   hydrocortisone (ANUSOL-HC) suppository 25 mg   insulin aspart (novoLOG) injection 0-15 Units   insulin aspart (novoLOG) injection 5 Units   insulin detemir (LEVEMIR) injection 20 Units   labetalol (NORMODYNE) injection 10 mg   lidocaine (PF) (XYLOCAINE) 1 % injection 5 mL   lidocaine-prilocaine (EMLA) cream 1 application   midodrine (PROAMATINE) tablet 10 mg   multivitamin (RENA-VIT) tablet 1 tablet   ondansetron (ZOFRAN) tablet 4 mg   Or   ondansetron (ZOFRAN) injection 4 mg   oxyCODONE (Oxy IR/ROXICODONE) immediate release tablet 5-10 mg   pentafluoroprop-tetrafluoroeth (GEBAUERS) aerosol 1 application   phenol (CHLORASEPTIC) mouth spray 1 spray   polyethylene glycol (MIRALAX / GLYCOLAX) packet 17 g   senna-docusate (Senokot-S) tablet 1 tablet   sevelamer carbonate (RENVELA) tablet 2,400 mg   sodium chloride flush (NS) 0.9 % injection 3 mL   sodium chloride flush (NS) 0.9 % injection 3 mL   Outpatient Encounter Medications as of 12/14/2020  Medication Sig  Note   acetaminophen (TYLENOL) 325 MG tablet Take 1-2 tablets (325-650 mg total) by mouth every 4 (four) hours as needed for mild pain.    amiodarone (PACERONE) 200 MG tablet Take 1 tablet (200 mg total) by mouth daily for 19 days.    apixaban (ELIQUIS) 5 MG TABS tablet Take 1 tablet (5 mg total) by mouth 2  (two) times daily.    aspirin EC 81 MG tablet Take 81 mg by mouth at bedtime. Swallow whole.    atorvastatin (LIPITOR) 40 MG tablet TAKE ONE TABLET BY MOUTH DAILY    diphenhydrAMINE (BENADRYL) 25 MG tablet Take 25 mg by mouth every 6 (six) hours as needed for itching.    gabapentin (NEURONTIN) 300 MG capsule Take 1 capsule (300 mg total) by mouth at bedtime.    insulin detemir (LEVEMIR) 100 UNIT/ML injection Inject 0.16 mLs (16 Units total) into the skin daily.    Insulin Syringe-Needle U-100 28G X 1/2" 1 ML MISC Use with insulin daily Dx E11.22    midodrine (PROAMATINE) 5 MG tablet Take 1 tablet (5 mg total) by mouth 3 (three) times daily with meals.    Misc. Devices (EGG CRATE BED PAD) MISC Use to treat and prevent bed sores    oxyCODONE (OXY IR/ROXICODONE) 5 MG immediate release tablet Take 1 tablet (5 mg total) by mouth every 6 (six) hours as needed for severe pain.    senna-docusate (SENOKOT-S) 8.6-50 MG tablet Take 1 tablet by mouth 2 (two) times daily.    sevelamer carbonate (RENVELA) 800 MG tablet Take 1,600-2,400 mg by mouth See admin instructions. Take 2400 mg with each meal and 1600 mg with each snack    TRULICITY 1.5 0000000 SOPN INJECT CONTENTs OF ONE PEN UNDER THE SKIN WEEKLY ON SATURDAY (Patient taking differently: Inject 1.5 mg into the skin once a week. On Saturdays) 10/27/2020: Missed it sat so she took it on Wed    Patient Active Problem List   Diagnosis Date Noted   Gangrene of hand ([place]) 11/29/2020   ESRD (end stage renal disease) on dialysis ([employer]) 12/02/2020   Paroxysmal atrial fibrillation ([place])    Cardiac arrest ([place])    CAP (community acquired pneumonia) 10/27/2020   Altered mental status 10/27/2020   Acute respiratory failure with hypoxemia ([place]) 10/27/2020   Lactic acidosis 10/27/2020   Hypoalbuminemia 10/27/2020   Hyponatremia 10/27/2020   Hyperlipidemia 10/27/2020   S/P BKA (below knee amputation) unilateral, right ([place]) 09/14/2020   Diabetic foot infection  ([place])    Medication monitoring encounter    Pressure injury of skin 08/11/2020   Thrombocytosis 08/09/2020   Osteomyelitis of right foot ([place]) 08/08/2020   Leucocytosis 07/05/2020   Below-knee amputation of left lower extremity ([place]) 06/16/2020   S/P BKA (below knee amputation) bilateral ([place]) 06/08/2020   Ischemic ulcer of toe of right foot ([place]) 04/13/2020   PVD (peripheral vascular disease)/ S/p BKA 04/13/2020   ESRD on hemodialysis ([place]) 01/27/2020   Abdominal wall cellulitis    Panniculitis 12/29/2019   Osteomyelitis ([place]) 10/15/2019   Hyperglycemia due to diabetes mellitus ([place]) 10/15/2019   Osteomyelitis of third toe of right foot ([place]) 10/14/2019   ESRD (end stage renal disease) ([place])    Asthma, mild persistent 07/25/2015   Hypertension secondary to other renal disorders 07/25/2015   Controlled type 2 diabetes mellitus with complication, with long-term current use of insulin ([place]) 03/30/2015   Anemia of chronic disease 02/17/2015   Osteopathy in diseases classified elsewhere, unspecified site 02/17/2015  Essential (primary) hypertension 06/24/2014   Heart failure ([place]) 06/24/2014   Obesity, Class III, BMI 40-49.9 (morbid obesity) ([place]) 06/24/2014    Conditions to be addressed/monitored: monitor client management of current medical needs   Care Plan : LCSW Care Plan  Updates made by Katha Cabal, LCSW since 12/14/2020 12:00 AM     Problem: Coping Skills (General Plan of Care)      Goal: Client will learn coping skills to help her manage medical issues and condtions faced by client   Start Date: 12/14/2020  Expected End Date: 03/14/2021  This Visit's Progress: On track  Recent Progress: On track  Priority: High  Note:   Current barriers:   Mobility issues  Challenges in managing multiple medical conditions Anxiety and Stress related to functional changes due to recent BKA  Clinical Goals:  patient will work with SW in next 30 days to address concerns  related to client management of current medical conditions patient will attend all scheduled medical appointments in next 30 days  as evidenced by patient report and care team review of appointment completion in EMR:  Client will talk regularly with her daughter, Christy Zhang, about needs of client in next 30 days  Clinical Interventions:  Collaboration with Claretta Fraise, MD regarding development and update of comprehensive plan of care as evidenced by provider attestation and co-signature Chart reviewed to assess client needs Talked with daughter of client, Christy Zhang ,about client needs Talked with [facility] about client hospitalization at [facility] Talked with [facility] about family support for client (daughters are supportive) Provided counseling support for [facility] related to needs of client Talked with [facility] about client progress medically Collaborated  with RNCM Chong Sicilian about client needs Encouraged Christy Zhang to call RNCM or LCSW as needed for CCM support Talked with [facility] about medical care for hand of client Talked with [facility] about wound care for client.  Christy Zhang said that client is receiving Hydrotherapy 3 times weekly for wound care for client. Talked with [facility] about her communication with staff care providers at the hospital  Patient Strengths: Has support from daughter, Ledon Snare of client is important to client Client is strong advocate for herself  Patient Deficits: Challenges with Managing Diabetes Challenges with Completing Dialysis Treatment weekly  Patient Goals:   Patient to attend scheduled medical appointments in next 30 days Patient to take medications as prescribed Patient to communicate with [facility] or LCSW as needed for support Patient to communicate regularly with daughter, Christy Zhang, for support Patient to practice self care and allow time to rest and relax -  Follow Up Plan: LCSW to call client or daughter , Christy Zhang, on 01/24/21 to assess  client needs     Norva Riffle.Janda Cargo MSW, LCSW Licensed Clinical Social Worker [facility] Care Management [phone]

## 2020-12-14 NOTE — Consult Note (Signed)
   [facility] Inpatient Consult   12/14/2020  [name] 11/20/1955 [id number]  [name]  Accountable Care Organization [ACO] Patient: Medicare  Primary Care Provider:  Claretta Fraise, MD with [facility] this is an Embedded provider with a Chronic Care Management program and team.  Follow up:  LLOS review  Reviewed for progress and disposition needs.  Patient did not meet criteria for LTACH noted by inpatient TOC RNCM.  Patient with ongoing medical treatment needs noted  Plan: Continue to follow for progress and needs for post hospital transition.  Natividad Brood, RN BSN [facility] Liaison  [phone] business mobile phone Toll free office [phone]  Fax number: [phone] [email or web address]

## 2020-12-14 NOTE — Progress Notes (Signed)
Patient ID: Christy Zhang, female   DOB: August 02, 1955, 65 y.o.   MRN: [id number]  [facility] Progress Note   Assessment/ Plan:   1.  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  She underwent bilateral above knee amputations on 11/29/2020 and continues to recover from these.  With leukocytosis but off antibiotics. 2. ESRD: She is usually on hemodialysis on a Monday/Wednesday/Friday schedule via left upper arm AV fistula and this week on TTS schedule to even out patient volume/staffing ratio; to get dialysis later today.  With her physical limitations, she would not be able to sit in a recliner for outpatient dialysis and will need LTAC for continued dialysis/wound care/physical therapy. 3. Anemia: Secondary to end-stage renal disease as well as malnutrition-inflammation complex from multiple wounds on her back/limb gangrene.  On high-dose Aranesp and getting PRBC transfusions intermittently. 4. CKD-MBD: Hyperphosphatemia-continue sevelamer and continue Hectorol for PTH control. 5. Nutrition: Albumin level significantly depressed secondary to multifocal sites of inflammation.  Continue ONS. 6.  Hypertension: Continue midodrine 3 times a day along with as needed albumin at hemodialysis to facilitate ultrafiltration.  Subjective:   Complains of discomfort from areas over her back and bilateral AKA stumps.  Expresses concern that "has not been dialyzed in 4 or 5 days"-reminded her that her last dialysis treatment was 8/16 and she is running off schedule this week.   Objective:   BP (!) 153/52 (BP Location: Left Arm)   Pulse 79   Temp 98.2 F (36.8 C) (Oral)   Resp 15   Ht '5\' 4"'$  (1.626 m)   Wt 80.7 kg   LMP  (LMP  Unknown)   SpO2 98%   BMI 30.55 kg/m   Physical Exam: Gen: Appears comfortable resting in bed, awakens to conversation CVS: Pulse regular rhythm, normal rate, S1 and S2 with ejection systolic murmur Resp: Diminished breath sounds over bases, no rales/rhonchi Abd: Soft, obese, nontender Ext: Status post bilateral AKA, RUA AVF with intact dressings  Labs: BMET Recent Labs  Lab 12/09/20 0745 12/10/20 0640 12/11/20 0914 12/12/20 0848 12/13/20 0524 12/14/20 0350  NA 134* 132* 134* 134* 134* 130*  K 4.2 4.4 4.9 3.7 4.7 5.1  CL 98 98 97* 95* 97* 94*  CO2 26 21* '24 26 26 '$ 20*  GLUCOSE 226* 255* 243* 144* 209* 225*  BUN 42* 59* 77* 46* 69* 99*  CREATININE 4.31* 5.60* 7.26* 4.54* 5.44* 6.28*  CALCIUM 8.3* 8.5* 8.7* 8.1* 8.7* 8.7*  PHOS  --   --  5.7* 2.6  --  5.2*   CBC Recent Labs  Lab 12/08/20 0315 12/09/20 0745 12/09/20 2122 12/11/20 0914 12/12/20 1317 12/13/20 0524 12/13/20 1616 12/14/20 0350 12/14/20 0700  WBC 45.6* 42.4*   < > 43.9* 40.4* 40.1*  --  41.5*  --   NEUTROABS 38.7* 37.7*  --   --   --   --   --   --   --   HGB 7.8* 7.0*   < > 7.7* 7.0* 6.3* 7.5* 6.6* 6.8*  HCT 24.2* 21.9*   < > 24.7* 22.3* 20.8* 24.0* 21.0* 21.8*  MCV 77.6* 78.8*   < > 82.1 83.8 84.2  --  84.7  --   PLT 645* 634*   < >  647* 588* 651*  --  620*  --    < > = values in this interval not displayed.      Medications:     (feeding supplement) PROSource Plus  30 mL Oral TID BM   sodium chloride   Intravenous Once   amiodarone  200 mg Oral Daily   apixaban  5 mg Oral BID   aspirin EC  81 mg Oral QHS   atorvastatin  40 mg Oral QPM   Chlorhexidine Gluconate Cloth  6 each Topical Q0600   collagenase   Topical Daily   darbepoetin (ARANESP) injection - DIALYSIS  150 mcg Intravenous Q Mon-HD   doxercalciferol  1.5 mcg Intravenous Q M,W,F-HD   feeding supplement (NEPRO CARB STEADY)  237 mL Oral TID BM   gabapentin  300 mg Oral QHS   insulin aspart  0-15 Units Subcutaneous TID WC   insulin  aspart  5 Units Subcutaneous TID WC   insulin detemir  20 Units Subcutaneous QHS   midodrine  10 mg Oral TID WC   multivitamin  1 tablet Oral QHS   polyethylene glycol  17 g Oral Daily   senna-docusate  1 tablet Oral BID   sevelamer carbonate  2,400 mg Oral TID with meals   sodium chloride flush  3 mL Intravenous Q12H   Elmarie Shiley, MD 12/14/2020, 11:06 AM

## 2020-12-15 DIAGNOSIS — I96 Gangrene, not elsewhere classified: Secondary | ICD-10-CM | POA: Diagnosis not present

## 2020-12-15 LAB — BASIC METABOLIC PANEL
Anion gap: 15 (ref 5–15)
BUN: 51 mg/dL — ABNORMAL HIGH (ref 8–23)
CO2: 21 mmol/L — ABNORMAL LOW (ref 22–32)
Calcium: 8.4 mg/dL — ABNORMAL LOW (ref 8.9–10.3)
Chloride: 94 mmol/L — ABNORMAL LOW (ref 98–111)
Creatinine, Ser: 4.09 mg/dL — ABNORMAL HIGH (ref 0.44–1.00)
GFR, Estimated: 12 mL/min — ABNORMAL LOW (ref >60)
Glucose, Bld: 214 mg/dL — ABNORMAL HIGH (ref 70–99)
Potassium: 4.4 mmol/L (ref 3.5–5.1)
Sodium: 130 mmol/L — ABNORMAL LOW (ref 135–145)

## 2020-12-15 LAB — CBC
HCT: 26.2 % — ABNORMAL LOW (ref 36.0–46.0)
Hemoglobin: 8.5 g/dL — ABNORMAL LOW (ref 12.0–15.0)
MCH: 27.8 pg (ref 26.0–34.0)
MCHC: 32.4 g/dL (ref 30.0–36.0)
MCV: 85.6 fL (ref 80.0–100.0)
Platelets: 562 10*3/uL — ABNORMAL HIGH (ref 150–400)
RBC: 3.06 MIL/uL — ABNORMAL LOW (ref 3.87–5.11)
RDW: 19.5 % — ABNORMAL HIGH (ref 11.5–15.5)
WBC: 44.2 10*3/uL — ABNORMAL HIGH (ref 4.0–10.5)
nRBC: 0.1 % (ref 0.0–0.2)

## 2020-12-15 LAB — GLUCOSE, CAPILLARY
Glucose-Capillary: 225 mg/dL — ABNORMAL HIGH (ref 70–99)
Glucose-Capillary: 240 mg/dL — ABNORMAL HIGH (ref 70–99)
Glucose-Capillary: 194 mg/dL — ABNORMAL HIGH (ref 70–99)

## 2020-12-15 MED ORDER — CHLORHEXIDINE GLUCONATE CLOTH 2 % EX PADS
6.0000 | MEDICATED_PAD | Freq: Every day | CUTANEOUS | Status: DC
  Administered 2020-12-16 – 2020-12-18 (×3): 6 via TOPICAL

## 2020-12-15 NOTE — Progress Notes (Signed)
Daily Progress Note   Patient Name: Christy Zhang       Date: 12/15/2020 DOB: [birth year]  Age: 65 y.o. MRN#: [id number] Attending Physician: Shawna Clamp, MD Primary Care Physician: Claretta Fraise, MD Admit Date: 12/04/2020 Length of Stay: 18 days  Reason for Consultation/Follow-up: Establishing goals of care  HPI/Patient Profile:   65 y.o. female  with past medical history of bilateral BKA, ESRD on dialysis on Monday/Wednesday/Friday, type 2 diabetes, hypertension, peripheral vascular disease, hyperlipidemia, recently discharged from here on 11/14/2020 after prolonged hospitalization when she was admitted with altered mental status and had to be intubated for airway protection and also underwent CRRT in the setting of septic shock from pneumonia causing acute respiratory failure with hypoxia, AKI, left radial occlusion s/p surgical revascularization. She was admitted on 12/25/2020 with painful sacral area due to decubitus unable to sit/tolerate for HD and subsequently missed 2 sessions of dialysis.   During admission noted dry gangrene of bilateral BKAs and left hand (from previous radial occlusion). Hand deemed gangrenous and non-salvageable. Only available treatment is bilateral AKA, which [facility] informed the family that would not extend her life. They expressed desire for aggressive measures. General surgery saw the patient for sacral decubitus and does NOT recommend debridement in the OR, rather hydrotherapy/santyl and continued [place] nursing.   Hand surgeon has folloed and recommended continue monitoring and if hand become more painful or changes to gangrene then surgery will be necessary. Patient's family concerned about the hand due to decreased mobility and increasing pain. Remains on HD, will likely need LTAC due to inability to sit for HD (sacral wounds). Renal mass MRI cancelled due to attempt x 4 with patient refusing.   Overall prognosis felt to be poor.  Today's Discussion: Called and  spoke with the patient's daughter Christy Zhang today.  She states that other than the hand surgeon no doctor has called to update her or spoken with her.  She was told by her primary doctors nurse that the wounds are continuing with current treatments and there is no sign of infection.  She was told that the goal to move her mom to [facility] is on hold because she did not qualify, though no details were given.  She has tried to call [facility] on a couple occasions and has not been able to get through her had a return phone call.  She states she saw her mom yesterday who appears frustrated but states she is in no pain.  Her hand continues with dry gangrene and, when the surgeon called her, they informed her that they would continue to watch for any development of wet gangrene or worsening changes.  We again reviewed the patient's current status.  I shared updates based on the most recent hospitalist note.  I again expressed our concern that her mom is very ill and overall has a poor prognosis.  She is at high risk for infection, worsening bedsores, other consequences of being primarily bedbound such as blood clots, pneumonia, etc.  Her daughter verbalized understanding.  She expressed that she still wants full scope of care and full code.  They are okay with the plans for long-term care for dialysis while her wounds heal.  I informed her I would check on the patient in a couple days when I am back at the facility and she verbalized understanding.  Assessment & Plan:   Impression:  65 year old female with recent admission for severe illness as detailed above now admitted with sacral pain due to sacral  decubitus unable to tolerate sitting for hemodialysis and subsequently missing 2 sessions.  She is a known severe vasculopath.  She has a history of bilateral BKA's which are now presenting with dry gangrene as well as a gangrenous left hand which is not salvageable, has since had bilateral AKA which surgery has made clear to  the patient and her family would not prolong her life.  Hand surgeon has evaluated hand and plan to see how much improvement in marginal area, likely would need amputation/wrist disarticulation as outpatient and need to continue monitoring and sooner intervention if increasing pain/gangrenous changes. Medical team has recommended comfort care.  However, the patient and her family desire aggressive measures at this point.  They requested to change CODE STATUS to full code on admission and remain so.  They are aware of the medical team's concerns for her mom and her overall poor prognosis. They continue to rely on prayer for hope and strength. There is also some concern for possible renal cell carcinoma noted on CT, patient refused MRI when attempted x 4 and since cancelled.   Will likely need LTAC for HD due to inability to sit for HD, although she may not have qualified. Overall poor prognosis.  SUMMARY OF RECOMMENDATIONS   Continue full scope of treatment and full code per patient and family's continued expressed wish/goal Continue with wound care Continue to monitor hand for any changes  Code Status: Full code  Goals of Care/Recommendations: Expressed goals of care are for recovery and to be able to go home Wishes for full scope of treatment  Symptom Management: Per primary team Palliative care is available as needed to assist with symptom management  Prognosis: Unable to determine  Discharge Planning: To Be Determined (likely need for LTAC)  Discussed with: Patient's family  Thank you for allowing [place] to participate in the care of Christy Zhang PMT will continue to support holistically.  No charge  Walden Field, NP Palliative Medicine Team  Team Phone # [phone] (Nights/Weekends)  12/15/2020, 9:31 AM

## 2020-12-15 NOTE — Progress Notes (Signed)
Physical Therapy Wound Treatment Patient Details  Name: Christy Zhang MRN: [id number] Date of Birth: 22-Feb-1956  Today's Date: 12/15/2020 Time: 4034-7425 Time Calculation (min): 70 min  Subjective  Subjective Assessment Subjective: Pt was premedicated - alert but not verbalizing much Patient and Family Stated Goals: None stated Date of Onset:  (Unknown) Prior Treatments:  (Dressing changes)  Pain Score:  Pt occasionally reports pain during session despite premedication. Overall tolerated treatment well.   Wound Assessment  Pressure Injury 11/28/20 Sacrum Unstageable - Full thickness tissue loss in which the base of the injury is covered by slough (yellow, tan, gray, green or brown) and/or eschar (tan, brown or black) in the wound bed. (Active)  Wound Image   12/15/20 1500  Dressing Type Barrier Film (skin prep);Foam - Lift dressing to assess site every shift;Gauze (Comment);Moist to moist;Santyl 12/15/20 1500  Dressing Changed;Clean;Dry;Intact 12/15/20 1500  Dressing Change Frequency Monday, Wednesday, Friday 12/15/20 1500  State of Healing Eschar 12/15/20 1500  Site / Wound Assessment Black;Brown;Pink;Yellow 12/15/20 1500  % Wound base Red or Granulating 40% 12/15/20 1500  % Wound base Yellow/Fibrinous Exudate 30% 12/15/20 1500  % Wound base Black/Eschar 30% 12/15/20 1500  % Wound base Other/Granulation Tissue (Comment) 0% 12/15/20 1500  Peri-wound Assessment Intact 12/15/20 1500  Wound Length (cm) 14.5 cm 12/15/20 1400  Wound Width (cm) 9 cm 12/15/20 1400  Wound Depth (cm) 2 cm 12/15/20 1400  Wound Surface Area (cm^2) 130.5 cm^2 12/15/20 1400  Wound Volume (cm^3) 261 cm^3 12/15/20 1400  Tunneling (cm) 0 12/15/20 1500  Undermining (cm) 2.5 cm at 9:00, 1.9 cm at 6:00 12/15/20 1500  Margins Unattached edges (unapproximated) 12/15/20 1500  Drainage Amount Moderate 12/15/20 1500  Drainage Description Serosanguineous 12/15/20 1500  Treatment Debridement (Selective);Hydrotherapy  (Pulse lavage);Packing (Saline gauze) 12/15/20 1500     Pressure Injury 11/28/20 Buttocks Left Unstageable - Full thickness tissue loss in which the base of the injury is covered by slough (yellow, tan, gray, green or brown) and/or eschar (tan, brown or black) in the wound bed. (Active)  Wound Image   12/15/20 1500  Dressing Type Barrier Film (skin prep);Foam - Lift dressing to assess site every shift;Gauze (Comment);Moist to moist;Santyl 12/15/20 1500  Dressing Changed;Clean;Dry;Intact 12/15/20 1500  Dressing Change Frequency Monday, Wednesday, Friday 12/15/20 1500  State of Healing Eschar 12/15/20 1500  Site / Wound Assessment Black;Yellow;Pink 12/15/20 1500  % Wound base Red or Granulating 10% 12/15/20 1500  % Wound base Yellow/Fibrinous Exudate 35% 12/15/20 1500  % Wound base Black/Eschar 55% 12/15/20 1500  % Wound base Other/Granulation Tissue (Comment) 0% 12/15/20 1500  Peri-wound Assessment Intact 12/15/20 1500  Wound Length (cm) 10.5 cm 12/15/20 1400  Wound Width (cm) 12 cm 12/15/20 1400  Wound Depth (cm) 1.9 cm 12/15/20 1400  Wound Surface Area (cm^2) 126 cm^2 12/15/20 1400  Wound Volume (cm^3) 239.4 cm^3 12/15/20 1400  Tunneling (cm) 0 12/15/20 1500  Undermining (cm) 0 12/15/20 1500  Margins Unattached edges (unapproximated) 12/15/20 1500  Drainage Amount Moderate 12/15/20 1500  Drainage Description Serosanguineous 12/15/20 1500  Treatment Debridement (Selective);Hydrotherapy (Pulse lavage);Packing (Saline gauze) 12/15/20 1500     Pressure Injury 11/28/20 Flank Right Unstageable - Full thickness tissue loss in which the base of the injury is covered by slough (yellow, tan, gray, green or brown) and/or eschar (tan, brown or black) in the wound bed. (Active)  Wound Image     12/15/20 1500  Dressing Type Barrier Film (skin prep);Foam - Lift dressing to assess site every shift;Gauze (Comment);Moist  to moist;Santyl 12/15/20 1500  Dressing Changed;Clean;Dry;Intact 12/15/20 1500   Dressing Change Frequency Monday, Wednesday, Friday 12/15/20 1500  State of Healing Non-healing 12/15/20 1500  Site / Wound Assessment Black;Yellow;Pink 12/15/20 1500  % Wound base Red or Granulating 5% 12/15/20 1500  % Wound base Yellow/Fibrinous Exudate 55% 12/15/20 1500  % Wound base Black/Eschar 40% 12/15/20 1500  % Wound base Other/Granulation Tissue (Comment) 0% 12/15/20 1500  Peri-wound Assessment Intact 12/15/20 1500  Wound Length (cm) 8 cm 12/15/20 1400  Wound Width (cm) 23 cm 12/15/20 1400  Wound Depth (cm) 1 cm 12/15/20 1400  Wound Surface Area (cm^2) 184 cm^2 12/15/20 1400  Wound Volume (cm^3) 184 cm^3 12/15/20 1400  Tunneling (cm) 0 12/15/20 1500  Undermining (cm) 0 12/15/20 1500  Margins Unattached edges (unapproximated) 12/15/20 1500  Drainage Amount Minimal 12/15/20 1500  Drainage Description Serosanguineous 12/15/20 1500  Treatment Debridement (Selective);Hydrotherapy (Pulse lavage);Packing (Saline gauze) 12/15/20 1500     Pressure Injury 11/28/20 Hip Right;Lateral Unstageable - Full thickness tissue loss in which the base of the injury is covered by slough (yellow, tan, gray, green or brown) and/or eschar (tan, brown or black) in the wound bed. (Active)  Wound Image   12/15/20 1500  Dressing Type Barrier Film (skin prep);Foam - Lift dressing to assess site every shift;Gauze (Comment);Moist to moist;Santyl 12/15/20 1500  Dressing Changed;Clean;Dry;Intact 12/15/20 1500  Dressing Change Frequency Monday, Wednesday, Friday 12/15/20 1500  State of Healing Non-healing 12/15/20 1500  Site / Wound Assessment Black;Pink;Yellow;Brown 12/15/20 1500  % Wound base Red or Granulating 5% 12/15/20 1500  % Wound base Yellow/Fibrinous Exudate 65% 12/15/20 1500  % Wound base Black/Eschar 30% 12/15/20 1500  % Wound base Other/Granulation Tissue (Comment) 0% 12/15/20 1500  Peri-wound Assessment Intact 12/15/20 1500  Wound Length (cm) 5 cm 12/15/20 1400  Wound Width (cm) 4.5 cm  12/15/20 1400  Wound Depth (cm) 1 cm 12/15/20 1400  Wound Surface Area (cm^2) 22.5 cm^2 12/15/20 1400  Wound Volume (cm^3) 22.5 cm^3 12/15/20 1400  Tunneling (cm) 0 12/15/20 1500  Undermining (cm) 0 12/15/20 1500  Margins Unattached edges (unapproximated) 12/15/20 1500  Drainage Amount Minimal 12/15/20 1500  Drainage Description Serosanguineous 12/15/20 1500  Treatment Debridement (Selective);Hydrotherapy (Pulse lavage);Packing (Saline gauze) 12/15/20 1500      Hydrotherapy Pulsed lavage therapy - wound location: Sacrum, R hip, L hip, and R flank Pulsed Lavage with Suction (psi): 8 psi Pulsed Lavage with Suction - Normal Saline Used: 2000 mL Pulsed Lavage Tip: Tip with splash shield Selective Debridement Selective Debridement - Location: Sacrum, R hip, L hip, and R flank Selective Debridement - Tools Used: Forceps, Scalpel, Scissors Selective Debridement - Tissue Removed: Black eschar and yellow unviable tissue    Wound Assessment and Plan  Wound Therapy - Assess/Plan/Recommendations Wound Therapy - Clinical Statement: Progressing with debridement. Noted L hand bleeding in cracked tissue between 1st and 2nd metacarpals and appeared to have some vaginal bleeding this session - RN notified of both. This patient will continue to benefit from hydrotherapy for selective removal of unviable tissue, to decrease bioburden, and promote wound bed healing. Wound Therapy - Functional Problem List: Decreased tolerance for position changes and OOB Factors Delaying/Impairing Wound Healing: Diabetes Mellitus, Immobility, Multiple medical problems Hydrotherapy Plan: Debridement, Dressing change, Patient/family education, Pulsatile lavage with suction Wound Therapy - Frequency: 6X / week Wound Therapy - Follow Up Recommendations: dressing changes by RN  Wound Therapy Goals- Improve the function of patient's integumentary system by progressing the wound(s) through the phases of wound  healing  (inflammation - proliferation - remodeling) by: Wound Therapy Goals - Improve the function of patient's integumentary system by progressing the wound(s) through the phases of wound healing by: Decrease Necrotic Tissue to: 20% Decrease Necrotic Tissue - Progress: Progressing toward goal Increase Granulation Tissue to: 80% Increase Granulation Tissue - Progress: Progressing toward goal Goals/treatment plan/discharge plan were made with and agreed upon by patient/family: Yes Time For Goal Achievement: 7 days Wound Therapy - Potential for Goals: Fair  Goals will be updated until maximal potential achieved or discharge criteria met.  Discharge criteria: when goals achieved, discharge from hospital, MD decision/surgical intervention, no progress towards goals, refusal/missing three consecutive treatments without notification or medical reason.  GP     Charges PT Wound Care Charges $Wound Debridement up to 20 cm: < or equal to 20 cm $ Wound Debridement each add'l 20 sqcm: 9 $PT PLS Gun and Tip: 1 Supply $PT Hydrotherapy Visit: 1 Visit       Thelma Comp 12/15/2020, 3:16 PM  Rolinda Roan, PT, DPT Acute Rehabilitation Services Pager: [phone] Office: [phone]

## 2020-12-15 NOTE — Progress Notes (Signed)
PROGRESS NOTE    Christy Zhang  [name] DOB: September 27, 1955 DOA: 12/18/2020 PCP: Claretta Fraise, MD   Brief Narrative:  This 29 old female with history of bilateral BKA, ESRD on dialysis,  type 2 diabetes, hypertension, peripheral vascular disease, hyperlipidemia, recently discharged from [facility] on 11/14/2020 after prolonged hospitalization when she was admitted with altered mental status and had to be intubated for airway protection and also underwent CRRT in the setting of septic shock from pneumonia causing acute respiratory failure with hypoxia, AKI.  She presented at [facility] hospital ED because it was too painful to sit down for dialysis due to her decubitus ulcer and she also missed 2 session of dialysis.  Patient was sent to [facility] from [facility] for further management.  Nephrology,orthopedics, palliative care,  vascular surgery consulted.   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 Patient presented with nonhealing wounds on bilateral BKA stumps:  Vascular surgery following, status post bilateral AKA on 12/13/2020. WBCs 43.9 on 12/10/2020.  Antibiotics discontinued on 12/07/2020 after discussion with vascular surgery.  Hospitalist has spoke with Dr. Grandville Silos [facility] [place] surgery on phone on 12/07/2020 who recommended to continue monitoring for now and if hand becomes more painful or changes into wet gangrene, patient will need surgical intervention. Hospitalist has spoken to the daughter on phone on 12/07/2020 who was very concerned  about her mother's hand because of decreased mobility and increasing pain. Dr. Grandville Silos spoke to the patient's daughter on 12/08/2020 on phone.  Currently no plans for hand surgery.   Sacral and right hip decubitus ulcers: Present on admission Currently undergoing hydrotherapy as per general surgery recommendations (did not recommend debridement).  Continue Santyl   End-stage renal disease:  Continue dialysis as per nephrology schedule. Hyperkalemia: Resolved. She is unable to sit for dialysis in the recliner with her physical limitations.  She will likely need LTAC for continued dialysis/wound care/physical therapy  Anemia of chronic disease: Hb dropped again to 6.6>6.8 > s/p PRBC 8.7>8.5 monitor H&H,  keep hemoglobin above 7.  Thrombocytosis Possibly reactive. Continue to monitor.  Mild hyponatremia Nephrology managing by dialysis.   Obesity-outpatient follow-up   Paroxysmal A. Fib HR controlled. Continue amiodarone.   Was on heparin since there was a question regarding possibility of patient undergoing hand surgery.  Resumed on Eliquis.   Left renal mass MRI was attempted x4 but subsequently canceled.   Diabetes mellitus type 2 Continue Levemir and CBGs with SSI.   A1c 6.4 on 10/27/2020  Generalized deconditioning Goals of care: Overall prognosis is very poor.   Palliative care has evaluated the patient during this hospitalization.  Patient remains full code. Palliative care has conversation with the family they still wants  patient full code.  DVT prophylaxis: Eliquis Code Status: Full code. Family Communication: No family at bed side. Disposition Plan:   Status is: Inpatient  Remains inpatient appropriate because:Inpatient level of care appropriate due to severity of illness  Dispo:  Patient From:  HOME  Planned Disposition: [place]  Medically stable for discharge: Yes  Consultants:  Nephro Vascular surgery  Procedures:   Antimicrobials:  Anti-infectives (From admission, onward)    Start     Dose/Rate Route Frequency Ordered Stop   12/08/20 1200  vancomycin (VANCOCIN) IVPB 1000 mg/200 mL premix  Status:  Discontinued        1,000 mg 200 mL/hr over 60 Minutes Intravenous Every M-W-F (Hemodialysis) 12/06/20 1138 12/07/20 1005   12/20/2020 0600  vancomycin (VANCOCIN) IVPB 1000 mg/200 mL premix        1,000 mg 200 mL/hr over 60 Minutes Intravenous To Surgery 12/04/20 1557 12/11/2020 1038   12/04/20 1200  vancomycin (VANCOCIN) IVPB 1000 mg/200 mL premix  Status:  Discontinued        1,000 mg 200 mL/hr over 60 Minutes Intravenous Every M-W-F (Hemodialysis) 12/02/20 1734 12/06/20 1138   12/03/20 1800  ceFEPIme (MAXIPIME) 1 g in sodium chloride 0.9 % 100 mL IVPB  Status:  Discontinued        1 g 200 mL/hr over 30 Minutes Intravenous Every 24 hours 12/02/20 1734 12/07/20 1005   12/02/20 1830  vancomycin (VANCOREADY) IVPB 1750 mg/350 mL        1,750 mg 175 mL/hr over 120 Minutes Intravenous  Once 12/02/20 1734 12/02/20 2156   12/02/20 1800  ceFEPIme (MAXIPIME) 2 g in sodium chloride 0.9 % 100 mL IVPB        2 g 200 mL/hr over 30 Minutes Intravenous  Once 12/02/20 1707 12/02/20 2003   12/02/20 1800  metroNIDAZOLE (FLAGYL) IVPB 500 mg  Status:  Discontinued        500 mg 100 mL/hr over 60 Minutes Intravenous Every 8 hours 12/02/20 1707 12/07/20 1005   12/02/20 1800  vancomycin (VANCOCIN) IVPB 1000 mg/200 mL premix  Status:  Discontinued        1,000 mg 200 mL/hr over 60 Minutes Intravenous  Once 12/02/20 1707 12/02/20 1734        Subjective: Patient was seen and examined at bedside.  Overnight events noted.   Patient is alert , arousable, oriented x 2. She is refusing hydrotherapy,  states she remains in a lot of pain. Patient has bilateral AKA,  left hand significantly gangrenous but appears dry. She is able to move fingers in the left hand.  Objective: Vitals:   12/14/20 1823 12/14/20 1830 12/14/20 2045  12/15/20 1000  BP: (!) 118/47 (!) 151/54 (!) 151/78 (!) 119/59  Pulse: 90 88 86 75  Resp: '12 14  17  '$ Temp:  98.5 F (36.9 C) 98.5 F (36.9 C) 98.2 F (36.8 C)  TempSrc:   Oral   SpO2:  100% 100% 100%  Weight:  82 kg    Height:        Intake/Output Summary (Last 24 hours) at 12/15/2020 1226 Last data filed at 12/15/2020 [id number] Gross per 24 hour  Intake 993 ml  Output 1300 ml  Net -307 ml   Filed Weights   12/13/20 0533 12/14/20 1414 12/14/20 1830  Weight: 80.7 kg 83.2 kg 82 kg    Examination:  General exam: Appears chronically ill and deconditioned, not in any distress Respiratory system: Clear to auscultation bilaterally, no wheezing Cardiovascular system: S1-S2 heard, regular rate and rhythm, no murmur Gastrointestinal system: Abdomen is soft, nontender, nondistended, BS positive  Central nervous system: Alert and oriented x2, no focal neurological deficits. Extremities: Bilateral AKA, staples noted at the site. Skin: Left hand significantly gangrenous, able to move fingers, appears dry. Psychiatry: Flat affect, oriented x 2    Data  Reviewed: I have personally reviewed following labs and imaging studies  CBC: Recent Labs  Lab 12/09/20 0745 12/09/20 2122 12/11/20 0914 12/12/20 1317 12/13/20 0524 12/13/20 1616 12/14/20 0350 12/14/20 0700 12/14/20 1150 12/14/20 2241 12/15/20 0708  WBC 42.4*   < > 43.9* 40.4* 40.1*  --  41.5*  --   --   --  44.2*  NEUTROABS 37.7*  --   --   --   --   --   --   --   --   --   --   HGB 7.0*   < > 7.7* 7.0* 6.3*   < > 6.6* 6.8* 6.6* 8.7* 8.5*  HCT 21.9*   < > 24.7* 22.3* 20.8*   < > 21.0* 21.8* 21.0* 26.7* 26.2*  MCV 78.8*   < > 82.1 83.8 84.2  --  84.7  --   --   --  85.6  PLT 634*   < > 647* 588* 651*  --  620*  --   --   --  562*   < > = values in this interval not displayed.   Basic Metabolic Panel: Recent Labs  Lab 12/11/20 0914 12/12/20 0848 12/13/20 0524 12/14/20 0350 12/15/20 0708  NA 134* 134* 134* 130* 130*  K  4.9 3.7 4.7 5.1 4.4  CL 97* 95* 97* 94* 94*  CO2 '24 26 26 '$ 20* 21*  GLUCOSE 243* 144* 209* 225* 214*  BUN 77* 46* 69* 99* 51*  CREATININE 7.26* 4.54* 5.44* 6.28* 4.09*  CALCIUM 8.7* 8.1* 8.7* 8.7* 8.4*  MG  --   --   --  2.3  --   PHOS 5.7* 2.6  --  5.2*  --    GFR: Estimated Creatinine Clearance: 14.6 mL/min (A) (by C-G formula based on SCr of 4.09 mg/dL (H)). Liver Function Tests: Recent Labs  Lab 12/12/20 0848  ALBUMIN 1.5*   No results for input(s): LIPASE, AMYLASE in the last 168 hours. No results for input(s): AMMONIA in the last 168 hours. Coagulation Profile: No results for input(s): INR, PROTIME in the last 168 hours. Cardiac Enzymes: No results for input(s): CKTOTAL, CKMB, CKMBINDEX, TROPONINI in the last 168 hours. BNP (last 3 results) No results for input(s): PROBNP in the last 8760 hours. HbA1C: No results for input(s): HGBA1C in the last 72 hours. CBG: Recent Labs  Lab 12/13/20 1624 12/13/20 2051 12/14/20 1055 12/15/20 0714 12/15/20 1149  GLUCAP 270* 187* 230* 225* 240*   Lipid Profile: No results for input(s): CHOL, HDL, LDLCALC, TRIG, CHOLHDL, LDLDIRECT in the last 72 hours. Thyroid Function Tests: No results for input(s): TSH, T4TOTAL, FREET4, T3FREE, THYROIDAB in the last 72 hours. Anemia Panel: No results for input(s): VITAMINB12, FOLATE, FERRITIN, TIBC, IRON, RETICCTPCT in the last 72 hours. Sepsis Labs: No results for input(s): PROCALCITON, LATICACIDVEN in the last 168 hours.  No results found for this or any previous visit (from the past 240 hour(s)).   Radiology Studies: No results found.  Scheduled Meds:  (feeding supplement) PROSource Plus  30 mL Oral TID BM   sodium chloride   Intravenous Once   amiodarone  200 mg Oral Daily   apixaban  5 mg Oral BID   aspirin EC  81 mg Oral QHS   atorvastatin  40 mg Oral QPM   Chlorhexidine Gluconate Cloth  6 each Topical Q0600   collagenase   Topical Daily   darbepoetin (ARANESP) injection -  DIALYSIS  150 mcg Intravenous Q Mon-HD   doxercalciferol  1.5 mcg Intravenous Q M,W,F-HD   feeding supplement (NEPRO CARB STEADY)  237 mL Oral TID BM   gabapentin  300 mg Oral QHS   insulin aspart  0-15 Units Subcutaneous TID WC   insulin aspart  5 Units Subcutaneous TID WC   insulin detemir  20 Units Subcutaneous QHS   midodrine  10 mg Oral TID WC   multivitamin  1 tablet Oral QHS   polyethylene glycol  17 g Oral Daily   senna-docusate  1 tablet Oral BID   sevelamer carbonate  2,400 mg Oral TID with meals   sodium chloride flush  3 mL Intravenous Q12H   Continuous Infusions:  sodium chloride     sodium chloride     sodium chloride 10 mL/hr at 12/04/20 0300     LOS: 18 days    Time spent: 35 mins  Hazel Leveille, MD [facility]   If 7PM-7AM, please contact night-coverage

## 2020-12-15 NOTE — TOC Progression Note (Addendum)
Transition of Care [facility]) - Progression Note    Patient Details  Name: Christy Zhang MRN: [id number] Date of Birth: [birth year]  Transition of Care [facility]) CM/SW Contact  Sharlet Salina Mila Homer, LCSW Phone Number: 12/15/2020, 6:26 PM  Clinical Narrative:  Patient's chart reviewed and per MD note, she is medically ready for discharge to SNF or LTAC. Nephrology's most recent note indicated that "With her physical limitations, she would not be able to sit in a recliner for outpatient dialysis and will need LTAC for continued dialysis/wound care/physical therapy. However, the nurse case manager advised in her note that patient will not qualify for an LTAC stay under her Medicare as she didn't have a 3 night ICU stay this admission. .   At this point, patient's discharge disposition is unclear and transitions of care will continue to follow and assist with the discharge disposition to a SNF if patient becomes better able to tolerate outpatient dialysis and a SNF can be found that will be able to meet her wound care needs.      Expected Discharge Plan: Long Term Acute Care (LTAC) Barriers to Discharge: Continued Medical Work up  Expected Discharge Plan and Services Expected Discharge Plan: [place] (LTAC). Not LTAC eligible.                                             Social Determinants of Health (SDOH) Interventions    Readmission Risk Interventions Readmission Risk Prevention Plan 09/18/2020  Transportation Screening Complete  Medication Review Press photographer) Complete  PCP or Specialist appointment within 3-5 days of discharge Complete  HRI or [place] Complete  SW Recovery Care/Counseling Consult Not Complete  [place] Not Applicable  Some recent data might be hidden

## 2020-12-15 NOTE — Plan of Care (Signed)
  Problem: Nutrition: Goal: Adequate nutrition will be maintained Outcome: Progressing   Problem: Activity: Goal: Risk for activity intolerance will decrease Outcome: Not Progressing   

## 2020-12-15 NOTE — Progress Notes (Signed)
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  She underwent bilateral above knee amputations on 12/04/2020 and continues to recover from these.  With leukocytosis but off antibiotics.  # ESRD: She is usually on hemodialysis on a Monday/Wednesday/Friday schedule via left upper arm AV fistula and this week on TTS schedule to even out patient volume/staffing ratio.  With her physical limitations, she would not be able to sit in a recliner for outpatient dialysis and will need LTAC for continued dialysis/wound care/physical therapy. Plan for next HD tomorrow.   # Anemia: Secondary to end-stage renal disease as well as malnutrition-inflammation complex from multiple wounds on her back/limb gangrene.  On high-dose Aranesp and getting PRBC transfusions intermittently.  # CKD-MBD: Hyperphosphatemia-continue sevelamer and continue Hectorol for PTH control.  # Nutrition: Albumin level significantly depressed secondary to multifocal sites of inflammation.  Continue ONS.  # Hypertension/volume: Continue midodrine 3 times a day along with as needed albumin at hemodialysis to facilitate ultrafiltration.  Subjective: Seen and examined at bedside.  Had dialysis yesterday and tolerated well.  No new event and she is sleeping comfortable.  No complaint. Objective Vital signs in last 24 hours: Vitals:   12/14/20 1823 12/14/20 1830 12/14/20 2045 12/15/20 1000  BP: (!) 118/47 (!) 151/54 (!) 151/78 (!) 119/59  Pulse: 90 88 86 75  Resp: '12 14  17  '$ Temp:  98.5 F (36.9 C) 98.5 F (36.9 C) 98.2 F (36.8 C)  TempSrc:   Oral   SpO2:  100% 100% 100%  Weight:  82 kg    Height:       Weight  change:   Intake/Output Summary (Last 24 hours) at 12/15/2020 1246 Last data filed at 12/15/2020 0904 Gross per 24 hour  Intake 993 ml  Output 1300 ml  Net -307 ml       Labs: Basic Metabolic Panel: Recent Labs  Lab 12/11/20 0914 12/12/20 0848 12/13/20 0524 12/14/20 0350 12/15/20 0708  NA 134* 134* 134* 130* 130*  K 4.9 3.7 4.7 5.1 4.4  CL 97* 95* 97* 94* 94*  CO2 '24 26 26 '$ 20* 21*  GLUCOSE 243* 144* 209* 225* 214*  BUN 77* 46* 69* 99* 51*  CREATININE 7.26* 4.54* 5.44* 6.28* 4.09*  CALCIUM 8.7* 8.1* 8.7* 8.7* 8.4*  PHOS 5.7* 2.6  --  5.2*  --    Liver Function Tests: Recent Labs  Lab 12/12/20 0848  ALBUMIN 1.5*   No results for input(s): LIPASE, AMYLASE in the last 168 hours. No results for input(s): AMMONIA in the last 168 hours. CBC: Recent Labs  Lab 12/09/20 0745 12/09/20 2122 12/11/20 0914 12/12/20 1317 12/13/20 0524 12/13/20 1616 12/14/20 0350 12/14/20 0700 12/14/20 1150 12/14/20 2241 12/15/20 0708  WBC 42.4*   < > 43.9* 40.4* 40.1*  --  41.5*  --   --   --  44.2*  NEUTROABS 37.7*  --   --   --   --   --   --   --   --   --   --   HGB 7.0*   < > 7.7* 7.0* 6.3*   < > 6.6*   < > 6.6* 8.7* 8.5*  HCT 21.9*   < >  24.7* 22.3* 20.8*   < > 21.0*   < > 21.0* 26.7* 26.2*  MCV 78.8*   < > 82.1 83.8 84.2  --  84.7  --   --   --  85.6  PLT 634*   < > 647* 588* 651*  --  620*  --   --   --  562*   < > = values in this interval not displayed.   Cardiac Enzymes: No results for input(s): CKTOTAL, CKMB, CKMBINDEX, TROPONINI in the last 168 hours. CBG: Recent Labs  Lab 12/13/20 1624 12/13/20 2051 12/14/20 1055 12/15/20 0714 12/15/20 1149  GLUCAP 270* 187* 230* 225* 240*    Iron Studies: No results for input(s): IRON, TIBC, TRANSFERRIN, FERRITIN in the last 72 hours. Studies/Results: No results found.  Medications: Infusions:  sodium chloride     sodium chloride     sodium chloride 10 mL/hr at 12/04/20 0300    Scheduled Medications:  (feeding  supplement) PROSource Plus  30 mL Oral TID BM   sodium chloride   Intravenous Once   amiodarone  200 mg Oral Daily   apixaban  5 mg Oral BID   aspirin EC  81 mg Oral QHS   atorvastatin  40 mg Oral QPM   Chlorhexidine Gluconate Cloth  6 each Topical Q0600   collagenase   Topical Daily   darbepoetin (ARANESP) injection - DIALYSIS  150 mcg Intravenous Q Mon-HD   doxercalciferol  1.5 mcg Intravenous Q M,W,F-HD   feeding supplement (NEPRO CARB STEADY)  237 mL Oral TID BM   gabapentin  300 mg Oral QHS   insulin aspart  0-15 Units Subcutaneous TID WC   insulin aspart  5 Units Subcutaneous TID WC   insulin detemir  20 Units Subcutaneous QHS   midodrine  10 mg Oral TID WC   multivitamin  1 tablet Oral QHS   polyethylene glycol  17 g Oral Daily   senna-docusate  1 tablet Oral BID   sevelamer carbonate  2,400 mg Oral TID with meals   sodium chloride flush  3 mL Intravenous Q12H    have reviewed scheduled and prn medications.  Physical Exam: General:NAD, comfortable Heart:RRR, s1s2 nl Lungs:clear b/l, no crackle Abdomen:soft, Non-tender, non-distended Extremities: Bilateral AKA, gangrenous change in hand. Dialysis Access: Right upper extremity AV fistula with intact dressing, has thrill and bruit.  Zoe Creasman Prasad Peachie Barkalow 12/15/2020,12:46 PM  LOS: 18 days

## 2020-12-16 DIAGNOSIS — I96 Gangrene, not elsewhere classified: Secondary | ICD-10-CM

## 2020-12-16 LAB — RENAL FUNCTION PANEL
Albumin: 1.3 g/dL — ABNORMAL LOW (ref 3.5–5.0)
Anion gap: 15 (ref 5–15)
BUN: 99 mg/dL — ABNORMAL HIGH (ref 8–23)
CO2: 18 mmol/L — ABNORMAL LOW (ref 22–32)
Calcium: 8.2 mg/dL — ABNORMAL LOW (ref 8.9–10.3)
Chloride: 97 mmol/L — ABNORMAL LOW (ref 98–111)
Creatinine, Ser: 6.12 mg/dL — ABNORMAL HIGH (ref 0.44–1.00)
GFR, Estimated: 7 mL/min — ABNORMAL LOW (ref >60)
Glucose, Bld: 246 mg/dL — ABNORMAL HIGH (ref 70–99)
Phosphorus: 3.3 mg/dL (ref 2.5–4.6)
Potassium: 5.4 mmol/L — ABNORMAL HIGH (ref 3.5–5.1)
Sodium: 130 mmol/L — ABNORMAL LOW (ref 135–145)

## 2020-12-16 LAB — GLUCOSE, CAPILLARY
Glucose-Capillary: 208 mg/dL — ABNORMAL HIGH (ref 70–99)
Glucose-Capillary: 222 mg/dL — ABNORMAL HIGH (ref 70–99)
Glucose-Capillary: 239 mg/dL — ABNORMAL HIGH (ref 70–99)
Glucose-Capillary: 215 mg/dL — ABNORMAL HIGH (ref 70–99)
Glucose-Capillary: 205 mg/dL — ABNORMAL HIGH (ref 70–99)

## 2020-12-16 LAB — CBC
HCT: 19.4 % — ABNORMAL LOW (ref 36.0–46.0)
Hemoglobin: 6.3 g/dL — CL (ref 12.0–15.0)
MCH: 28.3 pg (ref 26.0–34.0)
MCHC: 32.5 g/dL (ref 30.0–36.0)
MCV: 87 fL (ref 80.0–100.0)
Platelets: 519 10*3/uL — ABNORMAL HIGH (ref 150–400)
RBC: 2.23 MIL/uL — ABNORMAL LOW (ref 3.87–5.11)
RDW: 19.9 % — ABNORMAL HIGH (ref 11.5–15.5)
WBC: 39.5 10*3/uL — ABNORMAL HIGH (ref 4.0–10.5)
nRBC: 0.4 % — ABNORMAL HIGH (ref 0.0–0.2)

## 2020-12-16 LAB — PREPARE RBC (CROSSMATCH)

## 2020-12-16 MED ORDER — SODIUM CHLORIDE 0.9% IV SOLUTION: Freq: Once | INTRAVENOUS | Status: AC

## 2020-12-16 MED ORDER — SODIUM CHLORIDE 0.9 % IV SOLN: 100.0000 mL | INTRAVENOUS | Status: DC | PRN

## 2020-12-16 MED ORDER — ALTEPLASE 2 MG IJ SOLR: 2.0000 mg | Freq: Once | INTRAMUSCULAR | Status: DC | PRN

## 2020-12-16 MED ORDER — DOXERCALCIFEROL 4 MCG/2ML IV SOLN: 1.5000 ug | INTRAVENOUS | Status: DC

## 2020-12-16 MED ORDER — HEPARIN SODIUM (PORCINE) 1000 UNIT/ML DIALYSIS: 1000.0000 [IU] | INTRAMUSCULAR | Status: DC | PRN

## 2020-12-16 MED ORDER — SODIUM CHLORIDE 0.9 % IV BOLUS
500.0000 mL | Freq: Once | INTRAVENOUS | Status: AC
  Administered 2020-12-16: 500 mL via INTRAVENOUS

## 2020-12-16 MED ORDER — DARBEPOETIN ALFA 150 MCG/0.3ML IJ SOSY: 150.0000 ug | PREFILLED_SYRINGE | INTRAMUSCULAR | Status: DC

## 2020-12-16 MED ORDER — PENTAFLUOROPROP-TETRAFLUOROETH EX AERO: 1.0000 "application " | INHALATION_SPRAY | CUTANEOUS | Status: DC | PRN

## 2020-12-16 MED ORDER — LIDOCAINE HCL (PF) 1 % IJ SOLN: 5.0000 mL | INTRAMUSCULAR | Status: DC | PRN

## 2020-12-16 MED ORDER — NALOXONE HCL 0.4 MG/ML IJ SOLN
INTRAMUSCULAR | Status: AC
  Administered 2020-12-16: 0.4 mg
  Filled 2020-12-16: qty 1

## 2020-12-16 MED ORDER — LIDOCAINE-PRILOCAINE 2.5-2.5 % EX CREA: 1.0000 "application " | TOPICAL_CREAM | CUTANEOUS | Status: DC | PRN

## 2020-12-16 MED ORDER — SODIUM CHLORIDE 0.9 % IV BOLUS: 500.0000 mL | Freq: Once | INTRAVENOUS | Status: DC

## 2020-12-16 MED ORDER — HEPARIN SODIUM (PORCINE) 1000 UNIT/ML DIALYSIS: 20.0000 [IU]/kg | INTRAMUSCULAR | Status: DC | PRN

## 2020-12-16 NOTE — Progress Notes (Addendum)
PROGRESS NOTE    Christy Zhang  [id number] DOB: 17-Jun-1955 DOA: 12/14/2020 PCP: Claretta Fraise, MD   Brief Narrative:  This 48 old female with history of bilateral BKA, ESRD on dialysis,  type 2 diabetes, hypertension, peripheral vascular disease, hyperlipidemia, recently discharged from [facility] on 11/14/2020 after prolonged hospitalization when she was admitted with altered mental status and had to be intubated for airway protection and also underwent CRRT in the setting of septic shock from pneumonia causing acute respiratory failure with hypoxia, AKI.  She presented at [facility] hospital ED because it was too painful to sit down for dialysis due to her decubitus ulcer and she also missed 2 session of dialysis.  Patient was sent to [facility] from [facility] for further management.  Nephrology,orthopedics, palliative care,  vascular surgery consulted.   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 Patient presented with nonhealing wounds on bilateral BKA stumps:  Vascular surgery following, status post bilateral AKA on 12/06/2020. WBCs 43.9 on 12/10/2020.  Antibiotics discontinued on 12/07/2020 after discussion with vascular surgery.  Hospitalist has spoke with Dr. Grandville Silos [facility] [place] surgery on phone on 12/07/2020 who recommended to continue monitoring for now and if hand becomes more painful or changes into wet gangrene, patient will need surgical intervention. Hospitalist has spoken to the daughter on phone on 12/07/2020 who was very concerned  about her mother's hand because of decreased mobility and increasing pain. Dr. Grandville Silos spoke to the patient's daughter on 12/08/2020 on phone.  Currently no plans for hand surgery.   Sacral and right hip decubitus ulcers: Present on admission Currently undergoing hydrotherapy as per general surgery recommendations (did not recommend debridement).  Continue Santyl   End-stage renal disease:  Continue dialysis as per nephrology schedule. Hyperkalemia: Resolved. She is unable to sit for dialysis in the recliner with her physical limitations.  She will likely need LTAC for continued dialysis/wound care/physical therapy  Anemia of chronic disease: Hb dropped again to 6.6>6.8 > s/p PRBC 8.7>8.5 monitor H&H,  keep hemoglobin above 7.  Thrombocytosis Possibly reactive. Continue to monitor.  Mild hyponatremia Nephrology managing by dialysis.   Obesity-outpatient follow-up   Paroxysmal A. Fib HR controlled. Continue amiodarone.   Resumed on Eliquis.   Left renal mass MRI was attempted x4 but subsequently canceled.   Diabetes mellitus type 2 Continue Levemir and CBGs with SSI.   A1c 6.4 on 10/27/2020.  Hypotension:  continue midodrine.  Generalized deconditioning Goals of care: Overall prognosis is very poor.   Palliative care has evaluated the patient during this hospitalization.  Patient remains full code. Palliative care has conversation with the family they still wants  patient full code.  DVT prophylaxis: Eliquis Code Status: Full code. Family Communication: No family at bed side. Disposition Plan:   Status is: Inpatient  Remains inpatient appropriate because:Inpatient level of care appropriate due to severity of illness  Dispo:  Patient From:  HOME  Planned Disposition: [place]  Medically stable for discharge: Yes     Consultants:  Nephro Vascular surgery  Procedures:  Antimicrobials:  Anti-infectives (From admission, onward)    Start      Dose/Rate Route Frequency Ordered Stop   12/08/20 1200  vancomycin (VANCOCIN) IVPB 1000 mg/200 mL premix  Status:  Discontinued        1,000 mg 200 mL/hr over 60 Minutes Intravenous Every M-W-F (Hemodialysis) 12/06/20 1138 12/07/20 1005   12/09/2020 0600  vancomycin (VANCOCIN) IVPB 1000 mg/200 mL premix        1,000 mg 200 mL/hr over 60 Minutes Intravenous To Surgery 12/04/20 1557 12/16/2020 1038   12/04/20 1200  vancomycin (VANCOCIN) IVPB 1000 mg/200 mL premix  Status:  Discontinued        1,000 mg 200 mL/hr over 60 Minutes Intravenous Every M-W-F (Hemodialysis) 12/02/20 1734 12/06/20 1138   12/03/20 1800  ceFEPIme (MAXIPIME) 1 g in sodium chloride 0.9 % 100 mL IVPB  Status:  Discontinued        1 g 200 mL/hr over 30 Minutes Intravenous Every 24 hours 12/02/20 1734 12/07/20 1005   12/02/20 1830  vancomycin (VANCOREADY) IVPB 1750 mg/350 mL        1,750 mg 175 mL/hr over 120 Minutes Intravenous  Once 12/02/20 1734 12/02/20 2156   12/02/20 1800  ceFEPIme (MAXIPIME) 2 g in sodium chloride 0.9 % 100 mL IVPB        2 g 200 mL/hr over 30 Minutes Intravenous  Once 12/02/20 1707 12/02/20 2003   12/02/20 1800  metroNIDAZOLE (FLAGYL) IVPB 500 mg  Status:  Discontinued        500 mg 100 mL/hr over 60 Minutes Intravenous Every 8 hours 12/02/20 1707 12/07/20 1005   12/02/20 1800  vancomycin (VANCOCIN) IVPB 1000 mg/200 mL premix  Status:  Discontinued        1,000 mg 200 mL/hr over 60 Minutes Intravenous  Once 12/02/20 1707 12/02/20 1734        Subjective: Patient was seen and examined at bedside.  Overnight events noted. Patient is alert, arousable, oriented x2. Patient has bilateral AKA,  left hand significantly gangrenous but appears dry. She is able to move fingers in the left hand.  Objective: Vitals:   12/16/20 0159 12/16/20 0307 12/16/20 0550 12/16/20 0940  BP: (!) 129/50 136/62 (!) 140/51 90/63  Pulse: 80 83 80 96  Resp: '14 14 14 18  '$ Temp:  98.6 F (37 C) 99.4 F (37.4 C)   TempSrc:   Axillary Axillary   SpO2: 92% 92%    Weight:      Height:        Intake/Output Summary (Last 24 hours) at 12/16/2020 1154 Last data filed at 12/16/2020 0900 Gross per 24 hour  Intake 360 ml  Output 0 ml  Net 360 ml   Filed Weights   12/13/20 0533 12/14/20 1414 12/14/20 1830  Weight: 80.7 kg 83.2 kg 82 kg    Examination:  General exam: Appears chronically ill, deconditioned, not in any distress.   Respiratory system: Clear to auscultation bilaterally, no wheezing. Cardiovascular system: S1-S2 heard, regular rate and rhythm, no murmur Gastrointestinal system: Abdomen is soft, nontender, nondistended, BS positive  Central nervous system: Alert and oriented x2, no focal neurological deficits. Extremities: Bilateral AKA, staples noted at the site. Skin: Left hand significantly gangrenous, able to move fingers, appears dry, no change. Psychiatry: Flat affect, oriented x 2    Data Reviewed: I have personally reviewed following labs and imaging studies  CBC: Recent Labs  Lab 12/11/20 0914 12/12/20 1317 12/13/20 0524 12/13/20 1616 12/14/20 0350 12/14/20 0700  12/14/20 1150 12/14/20 2241 12/15/20 0708  WBC 43.9* 40.4* 40.1*  --  41.5*  --   --   --  44.2*  HGB 7.7* 7.0* 6.3*   < > 6.6* 6.8* 6.6* 8.7* 8.5*  HCT 24.7* 22.3* 20.8*   < > 21.0* 21.8* 21.0* 26.7* 26.2*  MCV 82.1 83.8 84.2  --  84.7  --   --   --  85.6  PLT 647* 588* 651*  --  620*  --   --   --  562*   < > = values in this interval not displayed.   Basic Metabolic Panel: Recent Labs  Lab 12/11/20 0914 12/12/20 0848 12/13/20 0524 12/14/20 0350 12/15/20 0708  NA 134* 134* 134* 130* 130*  K 4.9 3.7 4.7 5.1 4.4  CL 97* 95* 97* 94* 94*  CO2 '24 26 26 '$ 20* 21*  GLUCOSE 243* 144* 209* 225* 214*  BUN 77* 46* 69* 99* 51*  CREATININE 7.26* 4.54* 5.44* 6.28* 4.09*  CALCIUM 8.7* 8.1* 8.7* 8.7* 8.4*  MG  --   --   --  2.3  --   PHOS 5.7* 2.6  --  5.2*  --    GFR: Estimated Creatinine Clearance: 14.6 mL/min (A)  (by C-G formula based on SCr of 4.09 mg/dL (H)). Liver Function Tests: Recent Labs  Lab 12/12/20 0848  ALBUMIN 1.5*   No results for input(s): LIPASE, AMYLASE in the last 168 hours. No results for input(s): AMMONIA in the last 168 hours. Coagulation Profile: No results for input(s): INR, PROTIME in the last 168 hours. Cardiac Enzymes: No results for input(s): CKTOTAL, CKMB, CKMBINDEX, TROPONINI in the last 168 hours. BNP (last 3 results) No results for input(s): PROBNP in the last 8760 hours. HbA1C: No results for input(s): HGBA1C in the last 72 hours. CBG: Recent Labs  Lab 12/14/20 1055 12/15/20 0714 12/15/20 1149 12/15/20 1647 12/16/20 0745  GLUCAP 230* 225* 240* 194* 208*   Lipid Profile: No results for input(s): CHOL, HDL, LDLCALC, TRIG, CHOLHDL, LDLDIRECT in the last 72 hours. Thyroid Function Tests: No results for input(s): TSH, T4TOTAL, FREET4, T3FREE, THYROIDAB in the last 72 hours. Anemia Panel: No results for input(s): VITAMINB12, FOLATE, FERRITIN, TIBC, IRON, RETICCTPCT in the last 72 hours. Sepsis Labs: No results for input(s): PROCALCITON, LATICACIDVEN in the last 168 hours.  No results found for this or any previous visit (from the past 240 hour(s)).   Radiology Studies: No results found.  Scheduled Meds:  (feeding supplement) PROSource Plus  30 mL Oral TID BM   sodium chloride   Intravenous Once   amiodarone  200 mg Oral Daily   apixaban  5 mg Oral BID   aspirin EC  81 mg Oral QHS   atorvastatin  40 mg Oral QPM   Chlorhexidine Gluconate Cloth  6 each Topical Q0600   Chlorhexidine Gluconate Cloth  6 each Topical Q0600   collagenase   Topical Daily   darbepoetin (ARANESP) injection - DIALYSIS  150 mcg Intravenous Q Mon-HD   doxercalciferol  1.5 mcg Intravenous Q M,W,F-HD   feeding supplement (NEPRO CARB STEADY)  237 mL Oral TID BM   gabapentin  300 mg Oral QHS   insulin aspart  0-15 Units Subcutaneous TID WC   insulin aspart  5 Units Subcutaneous  TID WC   insulin detemir  20 Units Subcutaneous QHS   midodrine  10 mg Oral TID WC   multivitamin  1 tablet Oral QHS   polyethylene glycol  17 g  Oral Daily   senna-docusate  1 tablet Oral BID   sevelamer carbonate  2,400 mg Oral TID with meals   sodium chloride flush  3 mL Intravenous Q12H   Continuous Infusions:  sodium chloride     sodium chloride     sodium chloride 10 mL/hr at 12/04/20 0300   sodium chloride       LOS: 19 days    Time spent: 25 mins  Skarlet Lyons, MD [facility]   If 7PM-7AM, please contact night-coverage

## 2020-12-16 NOTE — Care Plan (Addendum)
Spoke with daughter [name] in detail about her mom's poor prognosis and overall situation.  Explained in detail about the DNR and DNI status.  She wants her mom to be DNR and DNI,  no resuscitation and no intubation.  RN has confirmed. Daughters at bed side, states they wants her mother to go peacefully, They do not want aggressive measures, No hemodialysis.

## 2020-12-16 NOTE — Consult Note (Signed)
NAME:  Christy Zhang, MRN:  [id number], DOB:  11/29/1955, LOS: 31 ADMISSION DATE:  12/09/2020, CONSULTATION DATE:  12/16/20 REFERRING MD:  Dr. Dwyane Dee, CHIEF COMPLAINT:  hypotension   History of Present Illness:   65 year old female with extensive prior history significant but not limited to ESRD on MWF dialysis, bilateral BKA, DMT2, HTN- but now on midodrine for chronic hypotension, PVD, and HLD admitted on 8/1 after missing dialysis x 2 after it was too painful for her to sit down with her sacral wound at [facility], resulting in weakness, and found to have worsening gangrene of left hand and slow healing of bilateral BKAs, transferred to [facility] for further vascular recommendations.   Underwent bilateral AKA on 8/9.  Pt/ family declined amputation of left arm reportedly for practical reasons.  Nephrology, orthopedics, palliative care, and vascular surgery following at [facility].  PCCM asked to evaluate patient on 8/20 given hypotension and lethargy.    Pertinent  Medical History  ESRD on MWF dialysis, bilateral BKA, DMT2, HTN, PVD, HLD, afib/ flutter on Eliquis, obesity  Recent hospitalization 11/14/20 with AMS, septic shock/ PNA/ intubated/ CRRT  Significant Hospital Events: Including procedures, antibiotic start and stop dates in addition to other pertinent events   8/1 admitted to [facility] 8/9 bilateral AKAs 8/20 PCCM consulted for hypotension  Interim History / Subjective:  Rapid response at bedside s/p narcan with improvement.  Had received oxy IR '5mg'$  at 0837  Objective   Blood pressure (!) 72/33, pulse (!) 53, temperature 99.1 F (37.3 C), temperature source Axillary, resp. rate 18, height '5\' 4"'$  (1.626 m), weight 82 kg, SpO2 94 %.        Intake/Output Summary (Last 24 hours) at 12/16/2020 1345 Last data filed at 12/16/2020 0900 Gross per 24 hour  Intake 360 ml  Output 0 ml  Net 360 ml   Filed Weights   12/13/20 0533 12/14/20 1414 12/14/20 1830  Weight: 80.7 kg 83.2 kg 82 kg     Examination: General:  Chronically ill older appearing female sitting upright in bed in no distress HEENT: MM pink/moist, pupils 3/reactive Neuro: Will wake up, follows intermittent commands, vocalizes some but not consistently, moves upper extremities CV: rr PULM:  non labored, clear anteriorly  GI: obese, +bs, NT Extremities: bilateral AKA, RUE AVF, left hand with dry gangrene Skin: posterior not examined  Resolved Hospital Problem list     Assessment & Plan:   Chronic hypotension on midodrine  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 Loanne Drilling.  Pt has some improvement in BP cuff reading after narcan.  At this time, she will wake up, verbalize, and follow commands; mentals status is reassuring.  Her RUE is limited given AVF and now bilateral AKA- limited sites to check cuff pressures /accuracy.  At this time, she does not require ICU, TRH going to transfer to PCU unit for closer monitoring.  Would recommend lower MAP goals, 55-60 and judge hypotension if in combination with altered mental status.  Can increase midodrine, currently on 10 mg TID.  If progressively hypotensive, vasopressor support could be more harmful for patient and possibly worsen limb ischemia and limit wound healing.  Limit sedative meds with her ESRD.  Suggest repeat BC given ongoing leukocytosis.  Prognosis is poor.  Highly recommend ongoing goals of discussion with family members.  Palliative care following.     Remainder per  primary:  ESRD on MWF dialysis via LUE AVF - ?concern for left lower pole renal neoplasm found on CT- pt refused MRI for further workup - will likely require LTAC placement given she is unable to sit in recliner for iHD Anemia of chronic disease Sacral decub wound (POA) Chronic anemia  DMT2 Obesity HLD PVD   Labs   CBC: Recent Labs  Lab 12/11/20 0914  12/12/20 1317 12/13/20 0524 12/13/20 1616 12/14/20 0350 12/14/20 0700 12/14/20 1150 12/14/20 2241 12/15/20 0708  WBC 43.9* 40.4* 40.1*  --  41.5*  --   --   --  44.2*  HGB 7.7* 7.0* 6.3*   < > 6.6* 6.8* 6.6* 8.7* 8.5*  HCT 24.7* 22.3* 20.8*   < > 21.0* 21.8* 21.0* 26.7* 26.2*  MCV 82.1 83.8 84.2  --  84.7  --   --   --  85.6  PLT 647* 588* 651*  --  620*  --   --   --  562*   < > = values in this interval not displayed.    Basic Metabolic Panel: Recent Labs  Lab 12/11/20 0914 12/12/20 0848 12/13/20 0524 12/14/20 0350 12/15/20 0708  NA 134* 134* 134* 130* 130*  K 4.9 3.7 4.7 5.1 4.4  CL 97* 95* 97* 94* 94*  CO2 '24 26 26 '$ 20* 21*  GLUCOSE 243* 144* 209* 225* 214*  BUN 77* 46* 69* 99* 51*  CREATININE 7.26* 4.54* 5.44* 6.28* 4.09*  CALCIUM 8.7* 8.1* 8.7* 8.7* 8.4*  MG  --   --   --  2.3  --   PHOS 5.7* 2.6  --  5.2*  --    GFR: Estimated Creatinine Clearance: 14.6 mL/min (A) (by C-G formula based on SCr of 4.09 mg/dL (H)). Recent Labs  Lab 12/12/20 1317 12/13/20 0524 12/14/20 0350 12/15/20 0708  WBC 40.4* 40.1* 41.5* 44.2*    Liver Function Tests: Recent Labs  Lab 12/12/20 0848  ALBUMIN 1.5*   No results for input(s): LIPASE, AMYLASE in the last 168 hours. No results for input(s): AMMONIA in the last 168 hours.  ABG    Component Value Date/Time   PHART 7.351 11/06/2020 1643   PCO2ART 51.3 (H) 11/06/2020 1643   PO2ART 153 (H) 11/06/2020 1643   HCO3 28.3 (H) 11/06/2020 1643   TCO2 30 11/06/2020 1643   ACIDBASEDEF 4.0 (H) 10/29/2020 1630   O2SAT 99.0 11/06/2020 1643     Coagulation Profile: No results for input(s): INR, PROTIME in the last 168 hours.  Cardiac Enzymes: No results for input(s): CKTOTAL, CKMB, CKMBINDEX, TROPONINI in the last 168 hours.  HbA1C: HB A1C (BAYER DCA - WAIVED)  Date/Time Value Ref Range Status  04/06/2020 11:05 AM 5.4 <7.0 % Final    Comment:                                          Diabetic Adult            <7.0                                        Healthy Adult        4.3 - 5.7                                                           (  DCCT/NGSP) American Diabetes Association's Summary of Glycemic Recommendations for Adults with Diabetes: Hemoglobin A1c <7.0%. More stringent glycemic goals (A1c <6.0%) may further reduce complications at the cost of increased risk of hypoglycemia.   01/06/2020 10:13 AM 6.2 <7.0 % Final    Comment:                                          Diabetic Adult            <7.0                                       Healthy Adult        4.3 - 5.7                                                           (DCCT/NGSP) American Diabetes Association's Summary of Glycemic Recommendations for Adults with Diabetes: Hemoglobin A1c <7.0%. More stringent glycemic goals (A1c <6.0%) may further reduce complications at the cost of increased risk of hypoglycemia.    Hgb A1c MFr Bld  Date/Time Value Ref Range Status  10/27/2020 06:48 PM 6.4 (H) 4.8 - 5.6 % Final    Comment:    (NOTE)         Prediabetes: 5.7 - 6.4         Diabetes: >6.4         Glycemic control for adults with diabetes: <7.0   08/09/2020 05:23 AM 6.5 (H) 4.8 - 5.6 % Final    Comment:    (NOTE) Pre diabetes:          5.7%-6.4%  Diabetes:              >6.4%  Glycemic control for   <7.0% adults with diabetes     CBG: Recent Labs  Lab 12/14/20 1055 12/15/20 0714 12/15/20 1149 12/15/20 1647 12/16/20 0745  GLUCAP 230* 225* 240* 194* 208*    Review of Systems:   Unable given limited verbal/ participation from patient  Past Medical History:  She,  has a past medical history of Anemia, Arthritis, Asthma, CHF (congestive heart failure) ([place]), Diabetes mellitus with complication ([place]), End stage renal disease on dialysis ([place]), Gangrene ([place]), Hyperlipidemia, and Hypertension.   Surgical History:   Past Surgical History:  Procedure Laterality Date    LEFT BELOW KNEE AMPUTATION (Left Knee)  06/08/2020   A/V  FISTULAGRAM Right 02/04/2017   Procedure: A/V Fistulagram;  Surgeon: Waynetta Sandy, MD;  Location: [place] [facility];  Service: Cardiovascular;  Laterality: Right;   ABDOMINAL AORTOGRAM W/LOWER EXTREMITY N/A 11/18/2019   Procedure: ABDOMINAL AORTOGRAM W/LOWER EXTREMITY;  Surgeon: [name], MD;  Location: [place] [facility];  Service: Cardiovascular;  Laterality: N/A;   ABDOMINAL AORTOGRAM W/LOWER EXTREMITY N/A 02/23/2020   Procedure: ABDOMINAL AORTOGRAM W/LOWER EXTREMITY;  Surgeon: [name], MD;  Location: [place] [facility];  Service: Cardiovascular;  Laterality: N/A;  Bilateral   ABDOMINAL AORTOGRAM W/LOWER EXTREMITY N/A 08/10/2020   Procedure: ABDOMINAL AORTOGRAM W/LOWER EXTREMITY;  Surgeon: [name], MD;  Location: [place] [facility];  Service: Cardiovascular;  Laterality: N/A;   AMPUTATION Left 06/08/2020   Procedure: LEFT BELOW KNEE AMPUTATION;  Surgeon: Cherre Robins, MD;  Location: [place];  Service: Vascular;  Laterality: Left;   AMPUTATION Right 09/14/2020   Procedure: RIGHT BELOW KNEE AMPUTATION;  Surgeon: Waynetta Sandy, MD;  Location: [place];  Service: Vascular;  Laterality: Right;   AMPUTATION Bilateral 12/24/2020   Procedure: BILATERAL ABOVE KNEE AMPUTATIONS;  Surgeon: Elam Dutch, MD;  Location: [place];  Service: Vascular;  Laterality: Bilateral;   AV FISTULA PLACEMENT Right 08/25/2014   Procedure: RIGHT BRACHIOCEPHALIC ARTERIOVENOUS (AV) FISTULA CREATION;  Surgeon: Mal Misty, MD;  Location: [place];  Service: Vascular;  Laterality: Right;   AV FISTULA PLACEMENT Right 03/18/2019   Procedure: INSERTION OF ARTERIOVENOUS (AV) GORE-TEX GRAFT RIGHT ARM;  Surgeon: Serafina Mitchell, MD;  Location: [place];  Service: Vascular;  Laterality: Right;   [place] Right 12/24/2018   Procedure: FIRST STAGE BASILIC VEIN TRANSPOSITION RIGHT UPPER ARM;  Surgeon: Serafina Mitchell, MD;  Location: [place];  Service: Vascular;   Laterality: Right;   FISTULA SUPERFICIALIZATION Right 11/03/2014   Procedure: RIGHT UPPER ARM FISTULA SUPERFICIALIZATION;  Surgeon: Mal Misty, MD;  Location: [place];  Service: Vascular;  Laterality: Right;   FISTULOGRAM Right 01/16/2016   Procedure: RIGHT UPPER EXTREMITY ARTERIOVENOUS FISTULOGRAM WITH BALLOON ANGIOPLASTY;  Surgeon: Vickie Epley, MD;  Location: [facility];  Service: Vascular;  Laterality: Right;   INSERTION OF DIALYSIS CATHETER     X4   IR FLUORO GUIDE CV LINE RIGHT  12/03/2018   IR REMOVAL TUN CV CATH W/O FL  07/06/2019   IR THROMBECTOMY AV FISTULA W/THROMBOLYSIS/PTA INC/SHUNT/IMG RIGHT Right 12/03/2018   IR THROMBECTOMY AV FISTULA W/THROMBOLYSIS/PTA/STENT INC/SHUNT/IMG RT Right 10/11/2019   IR US GUIDE VASC ACCESS RIGHT  12/03/2018   IR US GUIDE VASC ACCESS RIGHT  12/03/2018   IR US GUIDE VASC ACCESS RIGHT  10/14/2019   LIGATION OF COMPETING BRANCHES OF ARTERIOVENOUS FISTULA Right 11/03/2014   Procedure: LIGATION OF COMPETING BRANCHE x1 OF RIGHT UPPER ARM ARTERIOVENOUS FISTULA;  Surgeon: Mal Misty, MD;  Location: [place];  Service: Vascular;  Laterality: Right;   PERIPHERAL VASCULAR BALLOON ANGIOPLASTY Right 02/04/2017   Procedure: PERIPHERAL VASCULAR BALLOON ANGIOPLASTY;  Surgeon: Waynetta Sandy, MD;  Location: [place] [facility];  Service: Cardiovascular;  Laterality: Right;   PERIPHERAL VASCULAR BALLOON ANGIOPLASTY Right 11/18/2019   Procedure: PERIPHERAL VASCULAR BALLOON ANGIOPLASTY;  Surgeon: [name], MD;  Location: [place] [facility];  Service: Cardiovascular;  Laterality: Right;  anterior tibial   PERIPHERAL VASCULAR BALLOON ANGIOPLASTY  02/23/2020   Procedure: PERIPHERAL VASCULAR BALLOON ANGIOPLASTY;  Surgeon: [name], MD;  Location: [place] [facility];  Service: Cardiovascular;;  Lt  AT   PERIPHERAL VASCULAR INTERVENTION Right 08/10/2020   Procedure: PERIPHERAL VASCULAR INTERVENTION;  Surgeon: [name], MD;  Location: [place]  [facility];  Service: Cardiovascular;  Laterality: Right;   THROMBECTOMY BRACHIAL ARTERY Left 11/04/2020   Procedure: THROMBECTOMY RADIAL ARTERY with Bovine Patching.;  Surgeon: Cherre Robins, MD;  Location: [place];  Service: Vascular;  Laterality: Left;   TRANSMETATARSAL AMPUTATION Left 02/24/2020   Procedure: TRANSMETATARSAL AMPUTATION LEFT;  Surgeon: Cherre Robins, MD;  Location: [place];  Service: Vascular;  Laterality: Left;   TRANSMETATARSAL AMPUTATION Right 08/11/2020   Procedure: RIGHT TRANSMETATARSAL AMPUTATION;  Surgeon: Waynetta Sandy, MD;  Location: [place];  Service: Vascular;  Laterality: Right;   TUBAL  LIGATION       Social History:   reports that she quit smoking about 36 years ago. Her smoking use included cigarettes. She has never used smokeless tobacco. She reports that she does not drink alcohol and does not use drugs.   Family History:  Her family history includes Cancer in her father; Diabetes in her daughter, father, mother, and son; Heart disease in her daughter and son; Hypertension in her daughter, mother, sister, and son; Peripheral vascular disease in her son.   Allergies Allergies  Allergen Reactions   Sulfa Antibiotics Swelling     Home Medications  Prior to Admission medications   Medication Sig Start Date End Date Taking? Authorizing Provider  amiodarone (PACERONE) 200 MG tablet Take 1 tablet (200 mg total) by mouth daily for 19 days. 11/15/20 12/04/20 Yes Shelly Coss, MD  apixaban (ELIQUIS) 5 MG TABS tablet Take 1 tablet (5 mg total) by mouth 2 (two) times daily. 11/14/20  Yes Shelly Coss, MD  aspirin EC 81 MG tablet Take 81 mg by mouth at bedtime. Swallow whole.   Yes [provider]  atorvastatin (LIPITOR) 40 MG tablet TAKE ONE TABLET BY MOUTH DAILY 08/21/20  Yes Claretta Fraise, MD  diphenhydrAMINE (BENADRYL) 25 MG tablet Take 25 mg by mouth every 6 (six) hours as needed for itching.   Yes [provider]  gabapentin  (NEURONTIN) 300 MG capsule Take 1 capsule (300 mg total) by mouth at bedtime. 07/27/20  Yes Bayard Hugger, NP  insulin detemir (LEVEMIR) 100 UNIT/ML injection Inject 0.16 mLs (16 Units total) into the skin daily. 08/31/20  Yes Claretta Fraise, MD  midodrine (PROAMATINE) 5 MG tablet Take 1 tablet (5 mg total) by mouth 3 (three) times daily with meals. 11/14/20  Yes Shelly Coss, MD  oxyCODONE (OXY IR/ROXICODONE) 5 MG immediate release tablet Take 1 tablet (5 mg total) by mouth every 6 (six) hours as needed for severe pain. 09/18/20  Yes Elgergawy, Silver Huguenin, MD  senna-docusate (SENOKOT-S) 8.6-50 MG tablet Take 1 tablet by mouth 2 (two) times daily. 07/04/20  Yes Love, Ivan Anchors, PA-C  sevelamer carbonate (RENVELA) 800 MG tablet Take 1,600-2,400 mg by mouth See admin instructions. Take 2400 mg with each meal and 1600 mg with each snack   Yes [provider]  TRULICITY 1.5 0000000 SOPN INJECT CONTENTs OF ONE PEN UNDER THE SKIN WEEKLY ON SATURDAY Patient taking differently: Inject 1.5 mg into the skin once a week. On Saturdays 08/24/20  Yes [name], [name], MD  acetaminophen (TYLENOL) 325 MG tablet Take 1-2 tablets (325-650 mg total) by mouth every 4 (four) hours as needed for mild pain. 07/04/20   Love, Ivan Anchors, PA-C  Insulin Syringe-Needle U-100 28G X 1/2" 1 ML MISC Use with insulin daily Dx E11.22 08/31/20   Claretta Fraise, MD  Misc. Devices (EGG CRATE BED PAD) MISC Use to treat and prevent bed sores 11/20/20   Claretta Fraise, MD     Critical care time:  n/a    Kennieth Rad, ACNP [facility] 12/16/2020, 3:32 PM  See Amion for pager If no response to pager, please call PCCM consult pager After 7:00 pm call [place]

## 2020-12-16 NOTE — Significant Event (Signed)
Rapid Response Event Note   Reason for Call :  Hypotension  Initial Focused Assessment:  RN called because the patient was hypotensive and more lethargic. Patient is arousable to loud voice, and this is a change from how she was earlier. As per RN, the patient had only received 5 mg of oxycodone this morning. While reviewing BPs, it appears that she was normotensive at 0940 this morning. At 1329, her BP was found to be 72/33, despite receiving her Midodrine. I administered 0.4 mg of Narcan and the patient became more responsive.   BP 72/33 HR 87 RR 17 O2 100 (3L/[place])    Interventions:  Vitals taken 0.'4mg'$  of Narcan administered Fluid bolus' administered    Event Summary:   MD Notified: Dwyane Dee Call Time: [place] Time: 1332 End Time: [place], RN

## 2020-12-16 NOTE — Progress Notes (Signed)
TRH night shift telemetry coverage note.  The nursing staff reported that the patient's hemoglobin level was 6.3 g/dL.  A 1 unit of PRBC was ordered.  We will continue to monitor the hematocrit and hemoglobin.  Tennis Must, MD.  Addendum 0321: The patient's most recent blood pressure was 79/56 mmHg.  I have ordered another unit of PRBC to be transfused over 2 hours.  Tennis Must, MD

## 2020-12-16 NOTE — Progress Notes (Signed)
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  She underwent bilateral above knee amputations on 12/16/2020 and continues to recover from these.  With leukocytosis but off antibiotics.  # ESRD: She is usually on hemodialysis on a Monday/Wednesday/Friday schedule via left upper arm AV fistula and this week on TTS schedule to even out patient volume/staffing ratio.  With her physical limitations, she would not be able to sit in a recliner for outpatient dialysis and will need LTAC for continued dialysis/wound care/physical therapy. Plan for next HD today. On midodrine and lowered dialysate temp for intra-dialytic hypotension.   # Anemia: Secondary to end-stage renal disease as well as malnutrition-inflammation complex from multiple wounds on her back/limb gangrene.  On high-dose Aranesp and getting PRBC transfusions intermittently.  # CKD-MBD: Hyperphosphatemia-continue sevelamer and continue Hectorol for PTH control.  # Nutrition: Albumin level significantly depressed secondary to multifocal sites of inflammation.  Continue ONS.  # Hypertension/volume: Continue midodrine 3 times a day along with as needed albumin at hemodialysis to facilitate ultrafiltration.  Discussed with primary team.  Subjective: Seen and examined at bedside. No new event. Objective Vital signs in last 24 hours: Vitals:   12/16/20 0159 12/16/20 0307 12/16/20 0550 12/16/20 0940  BP: (!) 129/50 136/62 (!) 140/51 90/63  Pulse: 80 83 80 96  Resp: '14 14 14 18  '$ Temp:  98.6 F (37 C) 99.4 F (37.4 C)   TempSrc:  Axillary Axillary   SpO2: 92% 92%    Weight:      Height:       Weight change:    Intake/Output Summary (Last 24 hours) at 12/16/2020 1105 Last data filed at 12/16/2020 0900 Gross per 24 hour  Intake 360 ml  Output 0 ml  Net 360 ml        Labs: Basic Metabolic Panel: Recent Labs  Lab 12/11/20 0914 12/12/20 0848 12/13/20 0524 12/14/20 0350 12/15/20 0708  NA 134* 134* 134* 130* 130*  K 4.9 3.7 4.7 5.1 4.4  CL 97* 95* 97* 94* 94*  CO2 '24 26 26 '$ 20* 21*  GLUCOSE 243* 144* 209* 225* 214*  BUN 77* 46* 69* 99* 51*  CREATININE 7.26* 4.54* 5.44* 6.28* 4.09*  CALCIUM 8.7* 8.1* 8.7* 8.7* 8.4*  PHOS 5.7* 2.6  --  5.2*  --     Liver Function Tests: Recent Labs  Lab 12/12/20 0848  ALBUMIN 1.5*    No results for input(s): LIPASE, AMYLASE in the last 168 hours. No results for input(s): AMMONIA in the last 168 hours. CBC: Recent Labs  Lab 12/11/20 0914 12/12/20 1317 12/13/20 0524 12/13/20 1616 12/14/20 0350 12/14/20 0700 12/14/20 1150 12/14/20 2241 12/15/20 0708  WBC 43.9* 40.4* 40.1*  --  41.5*  --   --   --  44.2*  HGB 7.7* 7.0* 6.3*   < > 6.6*   < > 6.6* 8.7* 8.5*  HCT 24.7* 22.3* 20.8*   < > 21.0*   < > 21.0* 26.7* 26.2*  MCV 82.1 83.8 84.2  --  84.7  --   --   --  85.6  PLT 647* 588* 651*  --  620*  --   --   --  562*   < > =  values in this interval not displayed.    Cardiac Enzymes: No results for input(s): CKTOTAL, CKMB, CKMBINDEX, TROPONINI in the last 168 hours. CBG: Recent Labs  Lab 12/14/20 1055 12/15/20 0714 12/15/20 1149 12/15/20 1647 12/16/20 0745  GLUCAP 230* 225* 240* 194* 208*     Iron Studies: No results for input(s): IRON, TIBC, TRANSFERRIN, FERRITIN in the last 72 hours. Studies/Results: No results found.  Medications: Infusions:  sodium chloride     sodium chloride     sodium chloride 10 mL/hr at 12/04/20 0300    Scheduled Medications:  (feeding supplement) PROSource Plus  30 mL Oral TID BM   sodium chloride   Intravenous Once   amiodarone  200 mg Oral Daily   apixaban  5 mg Oral BID   aspirin EC  81  mg Oral QHS   atorvastatin  40 mg Oral QPM   Chlorhexidine Gluconate Cloth  6 each Topical Q0600   Chlorhexidine Gluconate Cloth  6 each Topical Q0600   collagenase   Topical Daily   darbepoetin (ARANESP) injection - DIALYSIS  150 mcg Intravenous Q Mon-HD   doxercalciferol  1.5 mcg Intravenous Q M,W,F-HD   feeding supplement (NEPRO CARB STEADY)  237 mL Oral TID BM   gabapentin  300 mg Oral QHS   insulin aspart  0-15 Units Subcutaneous TID WC   insulin aspart  5 Units Subcutaneous TID WC   insulin detemir  20 Units Subcutaneous QHS   midodrine  10 mg Oral TID WC   multivitamin  1 tablet Oral QHS   polyethylene glycol  17 g Oral Daily   senna-docusate  1 tablet Oral BID   sevelamer carbonate  2,400 mg Oral TID with meals   sodium chloride flush  3 mL Intravenous Q12H    have reviewed scheduled and prn medications.  Physical Exam: General:NAD, comfortable Heart:RRR, s1s2 nl Lungs:clear b/l, no crackle Abdomen:soft, Non-tender, non-distended Extremities: Bilateral AKA, gangrenous change in hand. Dialysis Access: Right upper extremity AV fistula with intact dressing, has thrill and bruit.  Christy Zhang Christy Zhang Christy Zhang 12/16/2020,11:05 AM  LOS: 19 days

## 2020-12-16 NOTE — Progress Notes (Signed)
Brief follow up note: Event noted. BP dropped. Pt is now DNR and DNI. No HD today as she is unstable.. Will follow and discuss with primary team. Discussed with dialysis nurse.

## 2020-12-17 DIAGNOSIS — I96 Gangrene, not elsewhere classified: Secondary | ICD-10-CM | POA: Diagnosis not present

## 2020-12-17 DIAGNOSIS — Z7189 Other specified counseling: Secondary | ICD-10-CM | POA: Diagnosis not present

## 2020-12-17 DIAGNOSIS — I959 Hypotension, unspecified: Secondary | ICD-10-CM

## 2020-12-17 DIAGNOSIS — A419 Sepsis, unspecified organism: Principal | ICD-10-CM

## 2020-12-17 DIAGNOSIS — N186 End stage renal disease: Secondary | ICD-10-CM | POA: Diagnosis not present

## 2020-12-17 DIAGNOSIS — R52 Pain, unspecified: Secondary | ICD-10-CM

## 2020-12-17 DIAGNOSIS — Z515 Encounter for palliative care: Secondary | ICD-10-CM | POA: Diagnosis not present

## 2020-12-17 LAB — GLUCOSE, CAPILLARY
Glucose-Capillary: 120 mg/dL — ABNORMAL HIGH (ref 70–99)
Glucose-Capillary: 219 mg/dL — ABNORMAL HIGH (ref 70–99)
Glucose-Capillary: 229 mg/dL — ABNORMAL HIGH (ref 70–99)
Glucose-Capillary: 220 mg/dL — ABNORMAL HIGH (ref 70–99)

## 2020-12-17 LAB — PREPARE RBC (CROSSMATCH)

## 2020-12-17 MED ORDER — SODIUM CHLORIDE 0.9% IV SOLUTION: Freq: Once | INTRAVENOUS | Status: AC

## 2020-12-17 NOTE — Progress Notes (Signed)
PROGRESS NOTE    Christy Zhang  [id number] DOB: Sep 15, 1955 DOA: 12/25/2020 PCP: Claretta Fraise, MD   Brief Narrative:  This 78 old female with history of bilateral BKA, ESRD on dialysis,  type 2 diabetes, hypertension, peripheral vascular disease, hyperlipidemia, recently discharged from [facility] on 11/14/2020 after prolonged hospitalization when she was admitted with altered mental status and had to be intubated for airway protection and also underwent CRRT in the setting of septic shock from pneumonia causing acute respiratory failure with hypoxia, AKI.  She presented at [facility] hospital ED because it was too painful to sit down for dialysis due to her decubitus ulcer and she also missed 2 session of dialysis.  Patient was sent to [facility] from [facility] for further management.  Nephrology,orthopedics, palliative care,  vascular surgery consulted.  Patient has downward decline, remains poorly responsive and hypotensive.  Family has decided DNR / DNI, no aggressive intervention.   Assessment & Plan:   Principal Problem:   Gangrene of hand ([place]) Active Problems:   Obesity, Class III, BMI 40-49.9 (morbid obesity) (HCC)   S/P BKA (below knee amputation) bilateral (HCC)   Paroxysmal atrial fibrillation (HCC)   ESRD (end stage renal disease) on dialysis (HCC)   Gangrene of left hand  sec. to Left radial artery thrombotic occlusion: S/p bilateral AKA : Status post thrombectomy of left carotid artery thrombotic occlusion:  Hand is still significantly gangrenous, with significant leucocytosis. Patient presented with nonhealing wounds on bilateral BKA stumps:  Vascular surgery following, status post bilateral AKA on 12/13/2020. WBCs 43.9 on 12/10/2020.  Antibiotics discontinued on 12/07/2020 after discussion with vascular surgery.  Hospitalist has spoke with Dr. Grandville Silos [facility] [place] on phone on 12/07/2020 who recommended to continue monitoring for now and if hand becomes more painful or changes into wet  gangrene, patient will need surgical intervention. Hospitalist has spoken to the daughter on phone on 12/07/2020 who was very concerned about her mother's hand because of decreased mobility and increasing pain. Dr. Grandville Silos spoke to the patient's daughter on 12/08/2020 on phone.  Currently no plans for hand surgery.   Sacral and right hip decubitus ulcers: Present on admission Currently undergoing hydrotherapy as per general surgery recommendations (did not recommend debridement).  Continue Santyl   End-stage renal disease:  Continued on dialysis as per nephrology schedule. Hyperkalemia: Resolved. She is unable to sit for dialysis in the recliner with her physical limitations.  She will likely need LTAC for continued dialysis/wound care/physical therapy. Patient remains hypotensive, not a candidate for pressors which may compromise perfusion to other extremities. Family requested no dialysis.  Anemia of chronic disease: Hb dropped again to 6.6>6.8 > s/p PRBC 8.7>8.5 monitor H&H,  keep hemoglobin above 7. Hemoglobin dropped to 6.8, has received 2 units PRBCs. Follow-up posttransfusion hemoglobin.  Thrombocytosis Possibly reactive. Continue to monitor.  Mild hyponatremia Nephrology managing by dialysis.   Obesity-outpatient follow-up   Paroxysmal A. Fib HR controlled. Continue amiodarone.   Resumed on Eliquis.   Left renal mass MRI was attempted x4 but subsequently canceled.   Diabetes mellitus type 2 Continue Levemir and CBGs with SSI.   A1c 6.4 on 10/27/2020.  Hypotension:  continue midodrine. Blood pressure has improved with blood transfusion.  Generalized deconditioning Goals of care: Overall prognosis is very poor.   Palliative care has evaluated the patient during this hospitalization.  Patient remains full code. After detailed discussion with family about goals of care family has decided DNR/DNI and no hemodialysis.  DVT prophylaxis: Eliquis Code Status:  DNR Family  Communication: Spoke with daughters. Disposition Plan:   Status is: Inpatient  Remains inpatient appropriate because:Inpatient level of care appropriate due to severity of illness  Dispo:  Patient From:  HOME  Planned Disposition: [place]  Medically stable for discharge: Yes     Consultants:  Nephro Vascular surgery  Procedures:  Antimicrobials:  Anti-infectives (From admission, onward)    Start     Dose/Rate Route Frequency Ordered Stop   12/08/20 1200  vancomycin (VANCOCIN) IVPB 1000 mg/200 mL premix  Status:  Discontinued        1,000 mg 200 mL/hr over 60 Minutes Intravenous Every M-W-F (Hemodialysis) 12/06/20 1138 12/07/20 1005   12/22/2020 0600  vancomycin (VANCOCIN) IVPB 1000 mg/200 mL premix        1,000 mg 200 mL/hr over 60 Minutes Intravenous To Surgery 12/04/20 1557 12/17/2020 1038   12/04/20 1200  vancomycin (VANCOCIN) IVPB 1000 mg/200 mL premix  Status:  Discontinued        1,000 mg 200 mL/hr over 60 Minutes Intravenous Every M-W-F (Hemodialysis) 12/02/20 1734 12/06/20 1138   12/03/20 1800  ceFEPIme (MAXIPIME) 1 g in sodium chloride 0.9 % 100 mL IVPB  Status:  Discontinued        1 g 200 mL/hr over 30 Minutes Intravenous Every 24 hours 12/02/20 1734 12/07/20 1005   12/02/20 1830  vancomycin (VANCOREADY) IVPB 1750 mg/350 mL        1,750 mg 175 mL/hr over 120 Minutes Intravenous  Once 12/02/20 1734 12/02/20 2156   12/02/20 1800  ceFEPIme (MAXIPIME) 2 g in sodium chloride 0.9 % 100 mL IVPB        2 g 200 mL/hr over 30 Minutes Intravenous  Once 12/02/20 1707 12/02/20 2003   12/02/20 1800  metroNIDAZOLE (FLAGYL) IVPB 500 mg  Status:  Discontinued        500 mg 100 mL/hr over 60 Minutes Intravenous Every 8 hours 12/02/20 1707 12/07/20 1005   12/02/20 1800  vancomycin (VANCOCIN) IVPB 1000 mg/200 mL premix  Status:  Discontinued        1,000 mg 200 mL/hr over 60 Minutes Intravenous  Once 12/02/20 1707 12/02/20 1734        Subjective: Patient was  seen and examined at bedside.  Overnight events noted. Patient is alert, arousable but remains confused. Patient has bilateral AKA,  left hand significantly gangrenous but appears dry. She is partially following commands today. Code status changed to DNR/DNI  Objective: Vitals:   12/17/20 0245 12/17/20 0400 12/17/20 0415 12/17/20 0825  BP: (!) 79/56 (!) 78/61 (!) 72/28 (!) 99/43  Pulse: 84 80 79 73  Resp: '19 18 18 '$ (!) 22  Temp: 97.9 F (36.6 C) 98.7 F (37.1 C) 99.1 F (37.3 C) 98 F (36.7 C)  TempSrc: Oral  Oral Oral  SpO2: 98%  100% 100%  Weight:      Height:        Intake/Output Summary (Last 24 hours) at 12/17/2020 1045 Last data filed at 12/17/2020 0900 Gross per 24 hour  Intake 310 ml  Output --  Net 310 ml   Filed Weights   12/13/20 0533 12/14/20 1414 12/14/20 1830  Weight: 80.7 kg 83.2 kg 82 kg    Examination:  General exam: Appears chronically ill, deconditioned, not in any distress.   Respiratory system: Clear to auscultation bilaterally, decreased breath sounds in bases Cardiovascular system: S1-S2 heard, regular rate and rhythm, no murmur Gastrointestinal system: Abdomen is soft, nontender, nondistended, BS positive  Central nervous system: Alert and oriented x2, no focal neurological deficits. Extremities: Bilateral AKA, staples noted at the site, no signs of erythema. Skin: Left hand significantly gangrenous, able to move fingers, appears dry, no change. Multiple unstageable sacral decubitus ulcers in the back. Psychiatry: Flat affect, oriented x 1    Data Reviewed: I have personally reviewed following labs and imaging studies  CBC: Recent Labs  Lab 12/12/20 1317 12/13/20 0524 12/13/20 1616 12/14/20 0350 12/14/20 0700 12/14/20 1150 12/14/20 2241 12/15/20 0708 12/16/20 1921  WBC 40.4* 40.1*  --  41.5*  --   --   --  44.2* 39.5*  HGB 7.0* 6.3*   < > 6.6* 6.8* 6.6* 8.7* 8.5* 6.3*  HCT 22.3* 20.8*   < > 21.0* 21.8* 21.0* 26.7* 26.2* 19.4*  MCV  83.8 84.2  --  84.7  --   --   --  85.6 87.0  PLT 588* 651*  --  620*  --   --   --  562* 519*   < > = values in this interval not displayed.   Basic Metabolic Panel: Recent Labs  Lab 12/11/20 0914 12/12/20 0848 12/13/20 0524 12/14/20 0350 12/15/20 0708 12/16/20 1921  NA 134* 134* 134* 130* 130* 130*  K 4.9 3.7 4.7 5.1 4.4 5.4*  CL 97* 95* 97* 94* 94* 97*  CO2 '24 26 26 '$ 20* 21* 18*  GLUCOSE 243* 144* 209* 225* 214* 246*  BUN 77* 46* 69* 99* 51* 99*  CREATININE 7.26* 4.54* 5.44* 6.28* 4.09* 6.12*  CALCIUM 8.7* 8.1* 8.7* 8.7* 8.4* 8.2*  MG  --   --   --  2.3  --   --   PHOS 5.7* 2.6  --  5.2*  --  3.3   GFR: Estimated Creatinine Clearance: 9.7 mL/min (A) (by C-G formula based on SCr of 6.12 mg/dL (H)). Liver Function Tests: Recent Labs  Lab 12/12/20 0848 12/16/20 1921  ALBUMIN 1.5* 1.3*   No results for input(s): LIPASE, AMYLASE in the last 168 hours. No results for input(s): AMMONIA in the last 168 hours. Coagulation Profile: No results for input(s): INR, PROTIME in the last 168 hours. Cardiac Enzymes: No results for input(s): CKTOTAL, CKMB, CKMBINDEX, TROPONINI in the last 168 hours. BNP (last 3 results) No results for input(s): PROBNP in the last 8760 hours. HbA1C: No results for input(s): HGBA1C in the last 72 hours. CBG: Recent Labs  Lab 12/16/20 1334 12/16/20 1624 12/16/20 2017 12/16/20 2250 12/17/20 0635  GLUCAP 222* 239* 215* 205* 120*   Lipid Profile: No results for input(s): CHOL, HDL, LDLCALC, TRIG, CHOLHDL, LDLDIRECT in the last 72 hours. Thyroid Function Tests: No results for input(s): TSH, T4TOTAL, FREET4, T3FREE, THYROIDAB in the last 72 hours. Anemia Panel: No results for input(s): VITAMINB12, FOLATE, FERRITIN, TIBC, IRON, RETICCTPCT in the last 72 hours. Sepsis Labs: No results for input(s): PROCALCITON, LATICACIDVEN in the last 168 hours.  No results found for this or any previous visit (from the past 240 hour(s)).   Radiology  Studies: No results found.  Scheduled Meds:  (feeding supplement) PROSource Plus  30 mL Oral TID BM   sodium chloride   Intravenous Once   amiodarone  200 mg Oral Daily   apixaban  5 mg Oral BID   aspirin EC  81 mg Oral QHS   atorvastatin  40 mg Oral QPM   Chlorhexidine Gluconate Cloth  6 each Topical Q0600   Chlorhexidine Gluconate Cloth  6 each Topical Q0600   collagenase  Topical Daily   [START ON 12/28/2020] darbepoetin (ARANESP) injection - DIALYSIS  150 mcg Intravenous Q Tue-HD   doxercalciferol  1.5 mcg Intravenous Q T,Th,Sa-HD   feeding supplement (NEPRO CARB STEADY)  237 mL Oral TID BM   gabapentin  300 mg Oral QHS   insulin aspart  0-15 Units Subcutaneous TID WC   insulin aspart  5 Units Subcutaneous TID WC   insulin detemir  20 Units Subcutaneous QHS   midodrine  10 mg Oral TID WC   multivitamin  1 tablet Oral QHS   polyethylene glycol  17 g Oral Daily   senna-docusate  1 tablet Oral BID   sevelamer carbonate  2,400 mg Oral TID with meals   sodium chloride flush  3 mL Intravenous Q12H   Continuous Infusions:  sodium chloride     sodium chloride     sodium chloride 10 mL/hr at 12/04/20 0300   sodium chloride     sodium chloride     sodium chloride       LOS: 20 days    Time spent: 25 mins  Daniel Ritthaler, MD [facility]   If 7PM-7AM, please contact night-coverage

## 2020-12-17 NOTE — Progress Notes (Signed)
Received patient report by bedside. Blood transfusion completed with night shift. Line flushed.

## 2020-12-17 NOTE — Progress Notes (Signed)
Daily Progress Note   Patient Name: Christy Zhang       Date: 12/17/2020 DOB: 01-16-1956  Age: 65 y.o. MRN#: [id number] Attending Physician: Shawna Clamp, MD Primary Care Physician: Claretta Fraise, MD Admit Date: 12/03/2020 Length of Stay: 20 days  Reason for Consultation/Follow-up: Establishing goals of care  HPI/Patient Profile:  65 y.o. female  with past medical history of bilateral BKA, ESRD on dialysis on Monday/Wednesday/Friday, type 2 diabetes, hypertension, peripheral vascular disease, hyperlipidemia, recently discharged from here on 11/14/2020 after prolonged hospitalization when she was admitted with altered mental status and had to be intubated for airway protection and also underwent CRRT in the setting of septic shock from pneumonia causing acute respiratory failure with hypoxia, AKI, left radial occlusion s/p surgical revascularization. She was admitted on 12/20/2020 with painful sacral area due to decubitus unable to sit/tolerate for HD and subsequently missed 2 sessions of dialysis.   During admission noted dry gangrene of bilateral BKAs and left hand (from previous radial occlusion). Hand deemed gangrenous and non-salvageable. Only available treatment is bilateral AKA, which [facility] informed the family that would not extend her life. They expressed desire for aggressive measures. General surgery saw the patient for sacral decubitus and does NOT recommend debridement in the OR, rather hydrotherapy/santyl and continued [facility] nursing.   Hand surgeon has folloed and recommended continue monitoring and if hand become more painful or changes to gangrene then surgery will be necessary. Patient's family concerned about the hand due to decreased mobility and increasing pain. Remains on HD, will likely need LTAC due to inability to sit for HD (sacral wounds). Renal mass MRI cancelled due to attempt x 4 with patient refusing.  Yesterday patient was found to have persistent hypotension throughout the  day.  Rapid response was called for somnolence and lethargy, arousable only to loud voice which is a change.  Had only received 5 mg of oxycodone but blood pressure at that time 72/33 despite midodrine.  The patient became more responsive after administration of Narcan.  Critical care was consulted for hypotension with some response to IV fluid resuscitation.  Noted dry gangrene to the left hand and bilateral AKA.  Felt aggressive treatment of hypotension due to pressors may worsen perfusion to her remaining limbs and declined ICU admission.   Critical care called the patient's family along with the hospitalist and after extensive discussion they did acquiesce to changing her status to DNR.  Overall prognosis very poor and high risk for deterioration.  Current Medications: Scheduled Meds:   (feeding supplement) PROSource Plus  30 mL Oral TID BM   sodium chloride   Intravenous Once   amiodarone  200 mg Oral Daily   apixaban  5 mg Oral BID   aspirin EC  81 mg Oral QHS   atorvastatin  40 mg Oral QPM   Chlorhexidine Gluconate Cloth  6 each Topical Q0600   Chlorhexidine Gluconate Cloth  6 each Topical Q0600   collagenase   Topical Daily   [START ON January 09, 2021] darbepoetin (ARANESP) injection - DIALYSIS  150 mcg Intravenous Q Tue-HD   doxercalciferol  1.5 mcg Intravenous Q T,Th,Sa-HD   feeding supplement (NEPRO CARB STEADY)  237 mL Oral TID BM   gabapentin  300 mg Oral QHS   insulin aspart  0-15 Units Subcutaneous TID WC   insulin aspart  5 Units Subcutaneous TID WC   insulin detemir  20 Units Subcutaneous QHS   midodrine  10 mg Oral TID WC   multivitamin  1  tablet Oral QHS   polyethylene glycol  17 g Oral Daily   senna-docusate  1 tablet Oral BID   sevelamer carbonate  2,400 mg Oral TID with meals   sodium chloride flush  3 mL Intravenous Q12H    Continuous Infusions:  sodium chloride     sodium chloride     sodium chloride 10 mL/hr at 12/04/20 0300   sodium chloride     sodium chloride      sodium chloride      PRN Meds: sodium chloride, sodium chloride, sodium chloride, sodium chloride, sodium chloride, acetaminophen **OR** acetaminophen, alteplase, diphenhydrAMINE, heparin, heparin, hydrocortisone, labetalol, lidocaine (PF), lidocaine-prilocaine, ondansetron **OR** ondansetron (ZOFRAN) IV, oxyCODONE, pentafluoroprop-tetrafluoroeth, phenol, sodium chloride flush  Subjective:   Subjective: Chart Reviewed. Updates received. Patient Assessed. Created space and opportunity for patient  and family to explore thoughts and feelings regarding current medical situation.  Today's Discussion: The patient is sleepy/lethargic. She does awaken to voice and answers questions, albeit with delayed response. Denies pain, dyspnea. No other complaints today. I called and spoke with her daughter Christy [facility] to update her.She has confirmed that her mom is now a DNR and they have elected no further aggressive care, no pressors, no escalation, no further hemodialysis. She understands her mom is end of life. I offered to discuss hospice services if this is something she feels may be helpful. She states she knows all about what hospice is and does not want that for her mom. I confirm she would like her mom to remain in the [place] and "allow nature to take it's course." I offered to re-engage chaplain services and she has agreed/accepted this. She has a good support system ("a lot of people") who are helping her through this. I have asked her to reach out to [place] if there's anything we can do for her. She states she has no further questions or needs.  Review of Systems  Constitutional:        Denies pain in general  Respiratory:  Negative for shortness of breath.   Gastrointestinal:  Negative for abdominal pain.   Objective:   Vital Signs: BP (!) 99/43 (BP Location: Left Arm)   Pulse 73   Temp 98 F (36.7 C) (Oral)   Resp (!) 22   Ht '5\' 4"'$  (1.626 m)   Wt 82 kg   LMP  (LMP Unknown)   SpO2 100%   BMI  31.03 kg/m  SpO2: SpO2: 100 % O2 Device: O2 Device: Nasal Cannula O2 Flow Rate: O2 Flow Rate (L/min): 3 L/min  Physical Exam: Physical Exam Vitals and nursing note reviewed.  Constitutional:      General: She is not in acute distress.    Appearance: She is morbidly obese. She is ill-appearing.  Cardiovascular:     Rate and Rhythm: Normal rate and regular rhythm.  Pulmonary:     Effort: Pulmonary effort is normal. No respiratory distress.     Breath sounds: Normal breath sounds. No wheezing or rhonchi.  Abdominal:     General: Abdomen is protuberant. Bowel sounds are normal.     Palpations: Abdomen is soft.  Musculoskeletal:     Right Lower Extremity: Right leg is amputated above knee.     Left Lower Extremity: Left leg is amputated above knee.  Skin:    General: Skin is warm and dry.     Comments: Dry gangrene extensive left hand  Neurological:     Mental Status: She is lethargic.     Comments:  Delayed response to questions, though gives appropriate answers    SpO2: SpO2: 100 % O2 Device:SpO2: 100 % O2 Flow Rate: .O2 Flow Rate (L/min): 3 L/min  IO: Intake/output summary:  Intake/Output Summary (Last 24 hours) at 12/17/2020 [id number] Last data filed at 12/16/2020 2300 Gross per 24 hour  Intake 110 ml  Output --  Net 110 ml    LBM: Last BM Date: 12/13/20 Baseline Weight: Weight: 86.2 kg Most recent weight: Weight: 82 kg   Palliative Assessment/Data: 20-30%   Assessment & Plan:   Impression:  65 year old female with recent admission for severe illness as detailed above now admitted with sacral pain due to sacral decubitus unable to tolerate sitting for hemodialysis and subsequently missing 2 sessions.  She is a known severe vasculopath.  She has a history of bilateral BKA's which are now presenting with dry gangrene as well as a gangrenous left hand which is not salvageable, has since had bilateral AKA which surgery has made clear to the patient and her family would not  prolong her life.  Hand surgeon has evaluated hand and plan to see how much improvement in marginal area, likely would need amputation/wrist disarticulation as outpatient and need to continue monitoring and sooner intervention if increasing pain/gangrenous changes. Medical team has recommended comfort care.  However, the patient and her family desire aggressive measures at this point.  They requested to change CODE STATUS to full code on admission and remain so.  They are aware of the medical team's concerns for her mom and her overall poor prognosis. They continue to rely on prayer for hope and strength. There is also some concern for possible renal cell carcinoma noted on CT, patient refused MRI when attempted x 4 and since cancelled.   Has developed persistent hypotension, poor candidate for pressors given limb ischemia. Query sepsis due to skin wounds, extensive hand gangrene. Less responsive over the past day or so, though was not able to receive iHD yesterday due to hypotension and family has since decided on no further HD. Has been made a DNR. Overall very poor prognosis, high risk for decompensation.   SUMMARY OF RECOMMENDATIONS   DNR No aggressive care/excalation No further HD Declines hospice services Allow a natural death in [place]  Code Status: DNR  Prognosis: < 2 weeks  Discharge Planning: Anticipated [place] Death  Discussed with: Patient's daughter Christy Zhang, Medical Team, Nursing staff  Thank you for allowing [place] to participate in the care of Christy Zhang PMT will continue to support holistically.  Time Total: 35 mins  Visit consisted of counseling and education dealing with the complex and emotionally intense issues of symptom management and palliative care in the setting of serious and potentially life-threatening illness. Greater than 50%  of this time was spent counseling and coordinating care related to the above assessment and plan.  Walden Field, NP Palliative  Medicine Team  Team Phone # [phone] (Nights/Weekends)  12/17/2020, 9:14 AM

## 2020-12-17 NOTE — Progress Notes (Signed)
offered pt. fluids, pt respond to voice but goes right back to sleep. wont follow commands/sips fluids.this RN not comfortable giving oral medication to patient.

## 2020-12-17 NOTE — Progress Notes (Signed)
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  She underwent bilateral above knee amputations on 12/25/2020 and continues to recover from these.  With leukocytosis but off antibiotics.  # ESRD: She is usually on hemodialysis on a Monday/Wednesday/Friday schedule via left upper arm AV fistula and this week on TTS schedule to even out patient volume/staffing ratio.  With her physical limitations, she would not be able to sit in a recliner for outpatient dialysis and will need LTAC for continued dialysis/wound care/physical therapy. Patient clinically declined probably because of worsening sepsis.  Becoming hypotensive and not able to tolerate dialysis.  I have discussed with the patient's daughter today.  The patient is DNR/DNI and the family wants no further invasive measures and no dialysis.  She will be comfort care.  Discussed with the primary team.  # Anemia: Secondary to end-stage renal disease as well as malnutrition-inflammation complex from multiple wounds on her back/limb gangrene.  On high-dose Aranesp and getting PRBC transfusions intermittently.  # CKD-MBD: Hyperphosphatemia-continue sevelamer and continue Hectorol for PTH control.  # Nutrition: Albumin level significantly depressed secondary to multifocal sites of inflammation.  Continue ONS.  # Hypertension/volume: She was on midodrine 3 times a day along with as needed albumin at hemodialysis to facilitate ultrafiltration.  Patient is DNR/DNI and comfort care.  No further dialysis. Recommend hospice care consult. I will sign off, please call back with question.  Discussed with Dr. Dwyane Dee.  Subjective: Seen and  examined at bedside.  Becoming hypotensive.  The family wants comfort/hospice care.   Objective Vital signs in last 24 hours: Vitals:   12/17/20 0245 12/17/20 0400 12/17/20 0415 12/17/20 0825  BP: (!) 79/56 (!) 78/61 (!) 72/28 (!) 99/43  Pulse: 84 80 79 73  Resp: '19 18 18 '$ (!) 22  Temp: 97.9 F (36.6 C) 98.7 F (37.1 C) 99.1 F (37.3 C) 98 F (36.7 C)  TempSrc: Oral  Oral Oral  SpO2: 98%  100% 100%  Weight:      Height:       Weight change:   Intake/Output Summary (Last 24 hours) at 12/17/2020 1210 Last data filed at 12/17/2020 0900 Gross per 24 hour  Intake 310 ml  Output --  Net 310 ml        Labs: Basic Metabolic Panel: Recent Labs  Lab 12/12/20 0848 12/13/20 0524 12/14/20 0350 12/15/20 0708 12/16/20 1921  NA 134*   < > 130* 130* 130*  K 3.7   < > 5.1 4.4 5.4*  CL 95*   < > 94* 94* 97*  CO2 26   < > 20* 21* 18*  GLUCOSE 144*   < > 225* 214* 246*  BUN 46*   < > 99* 51* 99*  CREATININE 4.54*   < > 6.28* 4.09* 6.12*  CALCIUM 8.1*   < > 8.7* 8.4* 8.2*  PHOS 2.6  --  5.2*  --  3.3   < > = values in this interval not displayed.    Liver Function Tests: Recent Labs  Lab 12/12/20 0848 12/16/20 1921  ALBUMIN 1.5* 1.3*    No results for input(s): LIPASE, AMYLASE in the last 168 hours. No results for input(s): AMMONIA in the last 168  hours. CBC: Recent Labs  Lab 12/12/20 1317 12/13/20 0524 12/13/20 1616 12/14/20 0350 12/14/20 0700 12/14/20 2241 12/15/20 0708 12/16/20 1921  WBC 40.4* 40.1*  --  41.5*  --   --  44.2* 39.5*  HGB 7.0* 6.3*   < > 6.6*   < > 8.7* 8.5* 6.3*  HCT 22.3* 20.8*   < > 21.0*   < > 26.7* 26.2* 19.4*  MCV 83.8 84.2  --  84.7  --   --  85.6 87.0  PLT 588* 651*  --  620*  --   --  562* 519*   < > = values in this interval not displayed.    Cardiac Enzymes: No results for input(s): CKTOTAL, CKMB, CKMBINDEX, TROPONINI in the last 168 hours. CBG: Recent Labs  Lab 12/16/20 1624 12/16/20 2017 12/16/20 2250 12/17/20 0635  12/17/20 1153  GLUCAP 239* 215* 205* 120* 219*     Iron Studies: No results for input(s): IRON, TIBC, TRANSFERRIN, FERRITIN in the last 72 hours. Studies/Results: No results found.  Medications: Infusions:  sodium chloride     sodium chloride     sodium chloride 10 mL/hr at 12/04/20 0300   sodium chloride     sodium chloride     sodium chloride      Scheduled Medications:  (feeding supplement) PROSource Plus  30 mL Oral TID BM   sodium chloride   Intravenous Once   amiodarone  200 mg Oral Daily   apixaban  5 mg Oral BID   aspirin EC  81 mg Oral QHS   atorvastatin  40 mg Oral QPM   Chlorhexidine Gluconate Cloth  6 each Topical Q0600   Chlorhexidine Gluconate Cloth  6 each Topical Q0600   collagenase   Topical Daily   [START ON 01-13-21] darbepoetin (ARANESP) injection - DIALYSIS  150 mcg Intravenous Q Tue-HD   doxercalciferol  1.5 mcg Intravenous Q T,Th,Sa-HD   feeding supplement (NEPRO CARB STEADY)  237 mL Oral TID BM   gabapentin  300 mg Oral QHS   insulin aspart  0-15 Units Subcutaneous TID WC   insulin aspart  5 Units Subcutaneous TID WC   insulin detemir  20 Units Subcutaneous QHS   midodrine  10 mg Oral TID WC   multivitamin  1 tablet Oral QHS   polyethylene glycol  17 g Oral Daily   senna-docusate  1 tablet Oral BID   sevelamer carbonate  2,400 mg Oral TID with meals   sodium chloride flush  3 mL Intravenous Q12H    have reviewed scheduled and prn medications.  Physical Exam: General: Lethargic female, not really following command Heart:RRR, s1s2 nl Lungs: Clear anteriorly Abdomen:soft, Non-tender, non-distended Extremities: Bilateral AKA, gangrenous change in hand. Dialysis Access: Right upper extremity AV fistula with intact dressing, has thrill and bruit.  Starlit Raburn Prasad Jessiah Wojnar 12/17/2020,12:10 PM  LOS: 20 days

## 2020-12-18 DIAGNOSIS — N186 End stage renal disease: Secondary | ICD-10-CM | POA: Diagnosis not present

## 2020-12-18 DIAGNOSIS — Z515 Encounter for palliative care: Secondary | ICD-10-CM | POA: Diagnosis not present

## 2020-12-18 DIAGNOSIS — Z7189 Other specified counseling: Secondary | ICD-10-CM | POA: Diagnosis not present

## 2020-12-18 DIAGNOSIS — I96 Gangrene, not elsewhere classified: Secondary | ICD-10-CM | POA: Diagnosis not present

## 2020-12-18 DIAGNOSIS — D72829 Elevated white blood cell count, unspecified: Secondary | ICD-10-CM

## 2020-12-18 LAB — TYPE AND SCREEN
ABO/RH(D): B POS
Antibody Screen: NEGATIVE
Unit division: 0
Unit division: 0
Unit division: 0

## 2020-12-18 LAB — BPAM RBC
Blood Product Expiration Date: 202209022359
Blood Product Expiration Date: 202209062359
Blood Product Expiration Date: 202209202359
ISSUE DATE / TIME: 202208181745
ISSUE DATE / TIME: 202208202209
ISSUE DATE / TIME: 202208210343
Unit Type and Rh: 7300
Unit Type and Rh: 7300
Unit Type and Rh: 7300

## 2020-12-18 LAB — GLUCOSE, CAPILLARY
Glucose-Capillary: 212 mg/dL — ABNORMAL HIGH (ref 70–99)
Glucose-Capillary: 168 mg/dL — ABNORMAL HIGH (ref 70–99)
Glucose-Capillary: 69 mg/dL — ABNORMAL LOW (ref 70–99)

## 2020-12-18 MED ORDER — HALOPERIDOL LACTATE 5 MG/ML IJ SOLN: 1.0000 mg | Freq: Four times a day (QID) | INTRAMUSCULAR | Status: DC | PRN

## 2020-12-18 MED ORDER — HYDROMORPHONE HCL 1 MG/ML IJ SOLN: 0.5000 mg | INTRAMUSCULAR | Status: DC | PRN

## 2020-12-18 MED ORDER — MORPHINE SULFATE (PF) 2 MG/ML IV SOLN: 1.0000 mg | INTRAVENOUS | Status: DC | PRN

## 2020-12-18 MED ORDER — LORAZEPAM 2 MG/ML IJ SOLN: 0.5000 mg | INTRAMUSCULAR | Status: DC | PRN

## 2020-12-18 NOTE — Progress Notes (Signed)
Daily Progress Note   Patient Name: Christy Zhang       Date: 12/18/2020 DOB: 1956/01/25  Age: 65 y.o. MRN#: [id number] Attending Physician: Shawna Clamp, MD Primary Care Physician: Claretta Fraise, MD Admit Date: 12/26/2020 Length of Stay: 21 days  Reason for Consultation/Follow-up: Establishing goals of care  HPI/Patient Profile:  65 y.o. female  with past medical history of bilateral BKA, ESRD on dialysis on Monday/Wednesday/Friday, type 2 diabetes, hypertension, peripheral vascular disease, hyperlipidemia, recently discharged from here on 11/14/2020 after prolonged hospitalization when she was admitted with altered mental status and had to be intubated for airway protection and also underwent CRRT in the setting of septic shock from pneumonia causing acute respiratory failure with hypoxia, AKI, left radial occlusion s/p surgical revascularization. She was admitted on 12/22/2020 with painful sacral area due to decubitus unable to sit/tolerate for HD and subsequently missed 2 sessions of dialysis.   During admission noted dry gangrene of bilateral BKAs and left hand (from previous radial occlusion). Hand deemed gangrenous and non-salvageable. Only available treatment is bilateral AKA, which [facility] informed the family that would not extend her life. They expressed desire for aggressive measures. General surgery saw the patient for sacral decubitus and does NOT recommend debridement in the OR, rather hydrotherapy/santyl and continued [place] nursing.   Hand surgeon has folloed and recommended continue monitoring and if hand become more painful or changes to gangrene then surgery will be necessary. Patient's family concerned about the hand due to decreased mobility and increasing pain. Remains on HD, will likely need LTAC due to inability to sit for HD (sacral wounds). Renal mass MRI cancelled due to attempt x 4 with patient refusing.   Yesterday patient was found to have persistent hypotension throughout  the day.  Rapid response was called for somnolence and lethargy, arousable only to loud voice which is a change.  Had only received 5 mg of oxycodone but blood pressure at that time 72/33 despite midodrine.  The patient became more responsive after administration of Narcan.  Critical care was consulted for hypotension with some response to IV fluid resuscitation.  Noted dry gangrene to the left hand and bilateral AKA.  Felt aggressive treatment of hypotension due to pressors may worsen perfusion to her remaining limbs and declined ICU admission.   Patient now DNR, no aggressive care, no HD, comfort care focus.  Current Medications: Scheduled Meds:   (feeding supplement) PROSource Plus  30 mL Oral TID BM   Chlorhexidine Gluconate Cloth  6 each Topical Q0600   collagenase   Topical Daily   doxercalciferol  1.5 mcg Intravenous Q T,Th,Sa-HD   feeding supplement (NEPRO CARB STEADY)  237 mL Oral TID BM   insulin aspart  5 Units Subcutaneous TID WC   insulin detemir  20 Units Subcutaneous QHS   multivitamin  1 tablet Oral QHS   polyethylene glycol  17 g Oral Daily   senna-docusate  1 tablet Oral BID   sevelamer carbonate  2,400 mg Oral TID with meals   sodium chloride flush  3 mL Intravenous Q12H    Continuous Infusions:  sodium chloride 10 mL/hr at 12/04/20 0300   sodium chloride      PRN Meds: sodium chloride, acetaminophen **OR** acetaminophen, diphenhydrAMINE, hydrocortisone, labetalol, morphine injection, ondansetron **OR** ondansetron (ZOFRAN) IV, oxyCODONE, phenol, sodium chloride flush  Subjective:   Subjective: Chart Reviewed. Updates received. Patient Assessed. Created space and opportunity for patient  and family to explore thoughts and feelings regarding current medical situation.  Today's Discussion: The patient is non-conversational. Spoke with the nurse who states the hospitalist made the patient comfort care, is asking for assistance with orders.  Review of Systems   Unable to perform ROS: Acuity of condition   Objective:   Vital Signs: BP (!) 91/46   Pulse 93   Temp 99 F (37.2 C)   Resp 18   Ht '5\' 4"'$  (1.626 m)   Wt 82 kg   LMP  (LMP Unknown)   SpO2 100%   BMI 31.03 kg/m  SpO2: SpO2: 100 % O2 Device: O2 Device: Nasal Cannula O2 Flow Rate: O2 Flow Rate (L/min): 3 L/min  Physical Exam: Physical Exam Vitals and nursing note reviewed.  Constitutional:      Appearance: She is morbidly obese. She is ill-appearing and toxic-appearing.     Comments: Minimally responsive  HENT:     Head: Normocephalic and atraumatic.  Cardiovascular:     Rate and Rhythm: Normal rate and regular rhythm.  Pulmonary:     Effort: Pulmonary effort is normal.     Breath sounds: Normal breath sounds.  Abdominal:     General: Abdomen is protuberant.     Palpations: Abdomen is soft.  Skin:    General: Skin is dry.     Comments: Extremities cool  Neurological:     Mental Status: She is lethargic.    SpO2: SpO2: 100 % O2 Device:SpO2: 100 % O2 Flow Rate: .O2 Flow Rate (L/min): 3 L/min  IO: Intake/output summary:  Intake/Output Summary (Last 24 hours) at 12/18/2020 1104 Last data filed at 12/18/2020 0800 Gross per 24 hour  Intake 0 ml  Output --  Net 0 ml    LBM: Last BM Date: 12/17/20 Baseline Weight: Weight: 86.2 kg Most recent weight: Weight: 82 kg   Palliative Assessment/Data: 10%   Assessment & Plan:   Impression:  65 year old female with recent admission for severe illness as detailed above now admitted with sacral pain due to sacral decubitus unable to tolerate sitting for hemodialysis and subsequently missing 2 sessions.  She is a known severe vasculopath.  She has a history of bilateral BKA's which are now presenting with dry gangrene as well as a gangrenous left hand which is not salvageable, has since had bilateral AKA which surgery has made clear to the patient and her family would not prolong her life.  Hand surgeon has evaluated hand and  plan to see how much improvement in marginal area, likely would need amputation/wrist disarticulation as outpatient and need to continue monitoring and sooner intervention if increasing pain/gangrenous changes. Medical team has recommended comfort care.  However, the patient and her family desire aggressive measures at this point.  They requested to change CODE STATUS to full code on admission and remain so.  They are aware of the medical team's concerns for her mom and her overall poor prognosis. They continue to rely on prayer for hope and strength. There is also some concern for possible renal cell carcinoma noted on CT, patient refused MRI when attempted x 4 and since cancelled.   Has developed persistent hypotension, poor candidate for pressors given limb ischemia. Query sepsis due to skin wounds, extensive hand gangrene. Less responsive over the past day or so, though was not able to receive iHD yesterday due to hypotension and family has since decided on no further HD. Has been made a DNR, comfort care. Minimally responsive this morning. End of life.  SUMMARY OF RECOMMENDATIONS   DNR No aggressive care/excalation No  further HD Family declines hospice services Allow a natural death in hospital Comfort orders entered  Code Status: DNR  Goals of Care/Recommendations: Comfort focus  Symptom Management: Dialudid 0.5 mg q 2 hours prn pain/dyspnea Ativan 0.5 mg q 4 hours prn anxiety Haldol 1 mg q 6 hours prn agitation  Prognosis: Hours - Days  Discharge Planning: Anticipated Hospital Death  Discussed with: Patient family, medical team, nursing staff  Thank you for allowing [place] to participate in the care of FAVOUR KEEGAN PMT will continue to support holistically.  Time Total: 75 min  Visit consisted of counseling and education dealing with the complex and emotionally intense issues of symptom management and palliative care in the setting of serious and potentially life-threatening  illness. Greater than 50%  of this time was spent counseling and coordinating care related to the above assessment and plan.  Walden Field, NP Palliative Medicine Team  Team Phone # [phone] (Nights/Weekends)  12/18/2020, 11:04 AM

## 2020-12-18 NOTE — Progress Notes (Signed)
This chaplain responded to PMT consult for spiritual care.  The chaplain understands the Pt. is comfort care.  The chaplain was updated by the Pt. RN-Allyson before the visit.    The chaplain notes the Pt. is resting comfortably during the bedside presence and prayer. The chaplain phoned the Pt. daughter-[name] per the RN's request. The chaplain is available for F/U spiritual care as needed.  Chaplain Sallyanne Kuster Pager [phone]

## 2020-12-18 NOTE — Progress Notes (Signed)
Patient very difficult to keep awake for medications,  gave midodrine and eliquis in small amount of apple sauce and continuously stimulated patient to get her to swallow

## 2020-12-18 NOTE — Progress Notes (Addendum)
PROGRESS NOTE    Christy Zhang  [id number] DOB: 1955/10/12 DOA: 12/22/2020 PCP: Claretta Fraise, MD   Brief Narrative:  This 10 old female with history of bilateral BKA, ESRD on dialysis,  type 2 diabetes, hypertension, peripheral vascular disease, hyperlipidemia, recently discharged from [facility] on 11/14/2020 after prolonged hospitalization when she was admitted with altered mental status and had to be intubated for airway protection and also underwent CRRT in the setting of septic shock from pneumonia causing acute respiratory failure with hypoxia, AKI.  She presented at [facility] hospital ED because it was too painful to sit down for dialysis due to her decubitus ulcer and she also missed 2 session of dialysis.  Patient was sent to [facility] from [facility] for further management.  Nephrology,orthopedics, palliative care,  vascular surgery consulted.  Patient has downward decline, remains poorly responsive and hypotensive.  Family has decided DNR / DNI, no aggressive intervention.  Patient is made comfort care, anticipated hospital death.   Assessment & Plan:   Principal Problem:   Gangrene of hand (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 Patient presented with nonhealing wounds on bilateral BKA stumps:  Vascular surgery following, status post bilateral AKA on 12/03/2020. WBCs 43.9 on 12/10/2020.  Antibiotics discontinued on 12/07/2020 after discussion with vascular surgery.  Hospitalist has spoke with Dr. Grandville Silos [facility] [place] surgery on phone on 12/07/2020 who recommended to continue monitoring for  now and if hand becomes more painful or changes into wet gangrene, patient will need surgical intervention. Hospitalist has spoken to the daughter on phone on 12/07/2020 who was very concerned about her mother's hand because of decreased mobility and increasing pain. Dr. Grandville Silos spoke to the patient's daughter on 12/08/2020 on phone.  Currently no plans for hand surgery.   Sacral and right hip decubitus ulcers: Present on admission Currently undergoing hydrotherapy as per general surgery recommendations (did not recommend debridement).  Continue Santyl Will discontinue wound care management.  Patient is now made comfort care.   End-stage renal disease:  Continued on dialysis as per nephrology schedule. Hyperkalemia: Resolved. She is unable to sit for dialysis in the recliner with her physical limitations.  She has needed LTAC for continued dialysis/wound care/physical therapy. Patient remains hypotensive, not a candidate for pressors which may compromise perfusion to other extremities. Family requested no dialysis.  Hemodialysis discontinued.  Anemia of chronic disease: Hb dropped again to 6.6>6.8 > s/p PRBC 8.7>8.5 monitor H&H,  keep hemoglobin above 7. Hemoglobin dropped to 6.8, has received 2 units PRBCs.   Thrombocytosis Possibly reactive. Continue to monitor.  Mild hyponatremia Comfort care.   Obesity-outpatient follow-up   Paroxysmal A. Fib HR controlled. Continue amiodarone.   Resumed on Eliquis.   Left renal mass MRI was attempted x4 but subsequently canceled.   Diabetes mellitus type 2 Continue Levemir and CBGs with SSI.   A1c 6.4 on 10/27/2020.  Hypotension:  continue midodrine. Blood pressure has improved with blood transfusion.  Generalized deconditioning Goals of care: Overall prognosis is very poor.   Palliative care has evaluated the patient during this hospitalization.  Patient remains full code. After detailed discussion  with family about goals of care  family has decided DNR/DNI and no hemodialysis. Patient is comfort care.  Family declined hospice.  Anticipated hospital death.  DVT prophylaxis: None Code Status: DNR Family Communication: Spoke with daughters. Disposition Plan:   Status is: Inpatient  Remains inpatient appropriate because:Inpatient level of care appropriate due to severity of illness  Dispo:  Patient From:  HOME  Planned Disposition: Comfort measures /anticipated hospital death  Medically stable for discharge: Yes     Consultants:  Nephro Vascular surgery  Procedures:  Antimicrobials:  Anti-infectives (From admission, onward)    Start     Dose/Rate Route Frequency Ordered Stop   12/08/20 1200  vancomycin (VANCOCIN) IVPB 1000 mg/200 mL premix  Status:  Discontinued        1,000 mg 200 mL/hr over 60 Minutes Intravenous Every M-W-F (Hemodialysis) 12/06/20 1138 12/07/20 1005   11/30/2020 0600  vancomycin (VANCOCIN) IVPB 1000 mg/200 mL premix        1,000 mg 200 mL/hr over 60 Minutes Intravenous To Surgery 12/04/20 1557 12/04/2020 1038   12/04/20 1200  vancomycin (VANCOCIN) IVPB 1000 mg/200 mL premix  Status:  Discontinued        1,000 mg 200 mL/hr over 60 Minutes Intravenous Every M-W-F (Hemodialysis) 12/02/20 1734 12/06/20 1138   12/03/20 1800  ceFEPIme (MAXIPIME) 1 g in sodium chloride 0.9 % 100 mL IVPB  Status:  Discontinued        1 g 200 mL/hr over 30 Minutes Intravenous Every 24 hours 12/02/20 1734 12/07/20 1005   12/02/20 1830  vancomycin (VANCOREADY) IVPB 1750 mg/350 mL        1,750 mg 175 mL/hr over 120 Minutes Intravenous  Once 12/02/20 1734 12/02/20 2156   12/02/20 1800  ceFEPIme (MAXIPIME) 2 g in sodium chloride 0.9 % 100 mL IVPB        2 g 200 mL/hr over 30 Minutes Intravenous  Once 12/02/20 1707 12/02/20 2003   12/02/20 1800  metroNIDAZOLE (FLAGYL) IVPB 500 mg  Status:  Discontinued        500 mg 100 mL/hr over 60 Minutes Intravenous Every 8 hours 12/02/20 1707 12/07/20 1005   12/02/20 1800   vancomycin (VANCOCIN) IVPB 1000 mg/200 mL premix  Status:  Discontinued        1,000 mg 200 mL/hr over 60 Minutes Intravenous  Once 12/02/20 1707 12/02/20 1734        Subjective: Patient was seen and examined at bedside.  Overnight events noted. Patient is barely responsive, partially opens eyes on calling her name. Patient has bilateral AKA,  left hand significantly gangrenous but appears dry. Patient is comfort care.   Objective: Vitals:   12/17/20 2126 12/18/20 0154 12/18/20 0527 12/18/20 0902  BP: (!) 98/47 (!) 96/45 (!) 104/46 (!) 91/46  Pulse: 75 83 81 93  Resp: '18 16 18 18  '$ Temp: (!) 97.5 F (36.4 C) 98.1 F (36.7 C) 98 F (36.7 C) 99 F (37.2 C)  TempSrc:      SpO2: 100% 100% 100% 100%  Weight:      Height:        Intake/Output Summary (Last 24 hours) at 12/18/2020 1140 Last data filed at 12/18/2020 0800 Gross per 24 hour  Intake 0 ml  Output --  Net 0 ml   Filed Weights   12/13/20 0533 12/14/20 1414 12/14/20 1830  Weight: 80.7 kg 83.2 kg 82 kg    Examination:  General exam: Appears chronically ill, deconditioned, not in any distress. Respiratory  system: Decreased breath sounds. Cardiovascular system: S1-S2 heard, regular rate and rhythm, no murmur Gastrointestinal system: Abdomen is soft, nontender, nondistended, BS positive  Central nervous system: Barely responsive.  Opens eyes on calling name. Extremities: Bilateral AKA, staples noted at the site, no signs of erythema. Skin: Left hand significantly gangrenous, able to move fingers, appears dry, no change. Multiple unstageable sacral decubitus ulcers in the back. Psychiatry: Flat affect.    Data Reviewed: I have personally reviewed following labs and imaging studies  CBC: Recent Labs  Lab 12/12/20 1317 12/13/20 0524 12/13/20 1616 12/14/20 0350 12/14/20 0700 12/14/20 1150 12/14/20 2241 12/15/20 0708 12/16/20 1921  WBC 40.4* 40.1*  --  41.5*  --   --   --  44.2* 39.5*  HGB 7.0* 6.3*   < >  6.6* 6.8* 6.6* 8.7* 8.5* 6.3*  HCT 22.3* 20.8*   < > 21.0* 21.8* 21.0* 26.7* 26.2* 19.4*  MCV 83.8 84.2  --  84.7  --   --   --  85.6 87.0  PLT 588* 651*  --  620*  --   --   --  562* 519*   < > = values in this interval not displayed.   Basic Metabolic Panel: Recent Labs  Lab 12/12/20 0848 12/13/20 0524 12/14/20 0350 12/15/20 0708 12/16/20 1921  NA 134* 134* 130* 130* 130*  K 3.7 4.7 5.1 4.4 5.4*  CL 95* 97* 94* 94* 97*  CO2 26 26 20* 21* 18*  GLUCOSE 144* 209* 225* 214* 246*  BUN 46* 69* 99* 51* 99*  CREATININE 4.54* 5.44* 6.28* 4.09* 6.12*  CALCIUM 8.1* 8.7* 8.7* 8.4* 8.2*  MG  --   --  2.3  --   --   PHOS 2.6  --  5.2*  --  3.3   GFR: Estimated Creatinine Clearance: 9.7 mL/min (A) (by C-G formula based on SCr of 6.12 mg/dL (H)). Liver Function Tests: Recent Labs  Lab 12/12/20 0848 12/16/20 1921  ALBUMIN 1.5* 1.3*   No results for input(s): LIPASE, AMYLASE in the last 168 hours. No results for input(s): AMMONIA in the last 168 hours. Coagulation Profile: No results for input(s): INR, PROTIME in the last 168 hours. Cardiac Enzymes: No results for input(s): CKTOTAL, CKMB, CKMBINDEX, TROPONINI in the last 168 hours. BNP (last 3 results) No results for input(s): PROBNP in the last 8760 hours. HbA1C: No results for input(s): HGBA1C in the last 72 hours. CBG: Recent Labs  Lab 12/17/20 0635 12/17/20 1153 12/17/20 1649 12/17/20 2125 12/18/20 0642  GLUCAP 120* 219* 229* 220* 168*   Lipid Profile: No results for input(s): CHOL, HDL, LDLCALC, TRIG, CHOLHDL, LDLDIRECT in the last 72 hours. Thyroid Function Tests: No results for input(s): TSH, T4TOTAL, FREET4, T3FREE, THYROIDAB in the last 72 hours. Anemia Panel: No results for input(s): VITAMINB12, FOLATE, FERRITIN, TIBC, IRON, RETICCTPCT in the last 72 hours. Sepsis Labs: No results for input(s): PROCALCITON, LATICACIDVEN in the last 168 hours.  No results found for this or any previous visit (from the past 240  hour(s)).   Radiology Studies: No results found.  Scheduled Meds:  (feeding supplement) PROSource Plus  30 mL Oral TID BM   Chlorhexidine Gluconate Cloth  6 each Topical Q0600   collagenase   Topical Daily   doxercalciferol  1.5 mcg Intravenous Q T,Th,Sa-HD   insulin aspart  5 Units Subcutaneous TID WC   insulin detemir  20 Units Subcutaneous QHS   multivitamin  1 tablet Oral QHS   sevelamer carbonate  2,400 mg Oral TID with meals   sodium chloride flush  3 mL Intravenous Q12H   Continuous Infusions:  sodium chloride 10 mL/hr at 12/04/20 0300   sodium chloride       LOS: 21 days    Time spent: 25 mins  Jaymin Waln, MD [facility]   If 7PM-7AM, please contact night-coverage

## 2020-12-18 NOTE — Final Consult Note (Signed)
[place] Nurse wound follow up Patient receiving care in 5M11 Patient has been changed to DNR and comfort care. Hydrotherapy and aggressive treatment to all wounds will discontinue. Orders placed for BID W/D dressing changes by the nursing staff.  WOC will sign off at this time but are available to this patient and medical staff  Please re-consult if needed.  Cathlean Marseilles Tamala Julian, MSN, RN, [place], Lysle Pearl, [facility] Wound Treatment Associate Pager [phone]

## 2020-12-18 NOTE — Progress Notes (Signed)
   12/18/20 0902  Assess: MEWS Score  Temp 99 F (37.2 C)  BP (!) 91/46  Pulse Rate 93  Resp 18  Level of Consciousness Responds to Voice  SpO2 100 %  Assess: MEWS Score  MEWS Temp 0  MEWS Systolic 1  MEWS Pulse 0  MEWS RR 0  MEWS LOC 1  MEWS Score 2  MEWS Score Color Yellow  Assess: if the MEWS score is Yellow or Red  Were vital signs taken at a resting state? Yes  Focused Assessment Change from prior assessment (see assessment flowsheet)  Early Detection of Sepsis Score *See Row Information* Low  MEWS guidelines implemented *See Row Information* No, previously yellow, continue vital signs every 4 hours  Treat  Pain Score Asleep  Notify: Provider  Date Provider Notified 12/18/20  Time Provider Notified 0915  Notification Type Face-to-face  Notification Reason Other (Comment) (status and vitals)  Provider response Other (Comment)  Date of Provider Response 12/18/20  Time of Provider Response [phone]  Document  Patient Outcome Other (Comment) (continue to monitor  DNR status)  Progress note created (see row info) Yes   Provider was bedside, patient is DNR/DNI and to be comfort care.

## 2020-12-19 ENCOUNTER — Encounter: Payer: Medicare Other | Admitting: Physician Assistant

## 2020-12-19 ENCOUNTER — Other Ambulatory Visit: Payer: Medicare Other

## 2020-12-19 DIAGNOSIS — I96 Gangrene, not elsewhere classified: Secondary | ICD-10-CM | POA: Diagnosis not present

## 2020-12-23 DIAGNOSIS — N186 End stage renal disease: Secondary | ICD-10-CM | POA: Diagnosis not present

## 2020-12-23 DIAGNOSIS — Z992 Dependence on renal dialysis: Secondary | ICD-10-CM | POA: Diagnosis not present

## 2020-12-26 ENCOUNTER — Encounter (HOSPITAL_BASED_OUTPATIENT_CLINIC_OR_DEPARTMENT_OTHER): Payer: Medicare Other | Admitting: Internal Medicine

## 2020-12-27 DIAGNOSIS — N186 End stage renal disease: Secondary | ICD-10-CM | POA: Diagnosis not present

## 2020-12-27 DIAGNOSIS — Z992 Dependence on renal dialysis: Secondary | ICD-10-CM | POA: Diagnosis not present

## 2020-12-28 NOTE — Death Summary Note (Signed)
Death Summary  Christy Zhang [id number] DOB: 1955/05/04 DOA: 12/12/20  PCP: Claretta Fraise, MD  Admit date: 12-12-20  Date of Death: 01-03-21  Time of Death: 11:30 AM  Notification: Claretta Fraise, MD notified of death of January 03, 2021   History of present illness:  Christy Zhang is a 65 y.o. female with a history of bilateral BKA, ESRD on dialysis,  type 2 diabetes, hypertension, peripheral vascular disease, hyperlipidemia, recently discharged from [facility] on 11/14/2020 after prolonged hospitalization when she was admitted with altered mental status and had to be intubated for airway protection and also underwent CRRT in the setting of septic shock from pneumonia causing acute respiratory failure with hypoxia, AKI.  She presented at [facility] hospital ED because it was too painful to sit down for dialysis due to her decubitus ulcer and she also missed 2 session of dialysis.  Patient was sent to [facility] from [facility] for further management.  Nephrology, orthopedics, palliative care,  vascular surgery consulted.  Patient was continued on hemodialysis as per schedule.  Vascular surgery was consulted for left hand gangrene.  Patient declined to have amputation.  Palliative care was consulted to discuss goals of care.  Initially family was in the impression that patient will recover.  Patient has downward decline, remains poorly responsive and hypotensive.  Goals of care discussed with family in detail.  family has decided DNR / DNI, no aggressive intervention.  Hemodialysis was discontinued.  Patient was made comfort care, Family does not want hospice.  Patient passed away at 11:30 AM.  Family was notified   Final Diagnoses:  1.  Septic shock. 2.  Uremia from end-stage renal disease.   The results of significant diagnostics from this hospitalization (including imaging, microbiology, ancillary and laboratory) are listed below for reference.    Significant Diagnostic Studies: DG Chest 1  View  Result Date: 11/23/2020 CLINICAL DATA:  Follow-up pneumonia. EXAM: CHEST  1 VIEW COMPARISON:  11/04/2020.  11/03/2020.  CT 11/02/2020. FINDINGS: Interim extubation and removal of left IJ line and NG tube. Left base and infiltrate. Small left pleural effusion. No pneumothorax. Vascular stents noted over the right subclavian region in stable position. IMPRESSION: 1.  Interim extubation removal of left IJ line and NG tube. 2. Left base atelectasis and infiltrate. Small left pleural effusion. These findings are new from prior study of 11/04/2020. Electronically Signed   By: Marcello Moores  Register   On: 11/23/2020 14:21   CT Head Wo Contrast  Result Date: 11/23/2020 CLINICAL DATA:  Altered mental status. EXAM: CT HEAD WITHOUT CONTRAST TECHNIQUE: Contiguous axial images were obtained from the base of the skull through the vertex without intravenous contrast. COMPARISON:  None. FINDINGS: Brain: There is mild cerebral atrophy with widening of the extra-axial spaces and ventricular dilatation. There are areas of decreased attenuation within the white matter tracts of the supratentorial brain, consistent with microvascular disease changes. Vascular: No hyperdense vessel or unexpected calcification. Skull: Normal. Negative for fracture or focal lesion. Sinuses/Orbits: No acute finding. Other: None. IMPRESSION: 1. Generalized cerebral atrophy. 2. No acute intracranial abnormality. Electronically Signed   By: Virgina Norfolk M.D.   On: 11/23/2020 22:50   DG Chest Portable 1 View  Result Date: Dec 12, 2020 CLINICAL DATA:  Generalized weakness. EXAM: PORTABLE CHEST 1 VIEW COMPARISON:  Multiple priors, most recent chest x-ray dated November 23, 2020 3: FINDINGS: Cardiac and mediastinal contours are unchanged. Mild bibasilar opacities, likely due to atelectasis. No focal consolidation. No large pleural effusion or evidence of pneumothorax. Vascular  stent projecting over the right axilla. Left IJ line, ET tube and enteric tube  have been removed when compared with prior x-ray. IMPRESSION: Mild bibasilar opacities, likely due to atelectasis. Electronically Signed   By: Yetta Glassman MD   On: 12/02/2020 11:11   DG Chest Portable 1 View  Result Date: 11/23/2020 CLINICAL DATA:  Altered mental status. EXAM: PORTABLE CHEST 1 VIEW COMPARISON:  Radiograph earlier today. FINDINGS: Lower lung volumes from earlier today. Stable left basilar opacity likely combination of pleural fluid and atelectasis/consolidation. Mild cardiomegaly with unchanged mediastinal contours. Aortic atherosclerosis. Vascular congestion. No pneumothorax. Right subclavian vascular stent. IMPRESSION: Lower lung volumes from earlier today. Stable left basilar opacity likely combination of pleural fluid and atelectasis/consolidation. Electronically Signed   By: Keith Rake M.D.   On: 11/23/2020 23:08    Microbiology: No results found for this or any previous visit (from the past 240 hour(s)).   Labs: Basic Metabolic Panel: Recent Labs  Lab 12/13/20 0524 12/14/20 0350 12/15/20 0708 12/16/20 1921  NA 134* 130* 130* 130*  K 4.7 5.1 4.4 5.4*  CL 97* 94* 94* 97*  CO2 26 20* 21* 18*  GLUCOSE 209* 225* 214* 246*  BUN 69* 99* 51* 99*  CREATININE 5.44* 6.28* 4.09* 6.12*  CALCIUM 8.7* 8.7* 8.4* 8.2*  MG  --  2.3  --   --   PHOS  --  5.2*  --  3.3   Liver Function Tests: Recent Labs  Lab 12/16/20 1921  ALBUMIN 1.3*   No results for input(s): LIPASE, AMYLASE in the last 168 hours. No results for input(s): AMMONIA in the last 168 hours. CBC: Recent Labs  Lab 12/12/20 1317 12/13/20 0524 12/13/20 1616 12/14/20 0350 12/14/20 0700 12/14/20 1150 12/14/20 2241 12/15/20 0708 12/16/20 1921  WBC 40.4* 40.1*  --  41.5*  --   --   --  44.2* 39.5*  HGB 7.0* 6.3*   < > 6.6* 6.8* 6.6* 8.7* 8.5* 6.3*  HCT 22.3* 20.8*   < > 21.0* 21.8* 21.0* 26.7* 26.2* 19.4*  MCV 83.8 84.2  --  84.7  --   --   --  85.6 87.0  PLT 588* 651*  --  620*  --   --   --   562* 519*   < > = values in this interval not displayed.   Cardiac Enzymes: No results for input(s): CKTOTAL, CKMB, CKMBINDEX, TROPONINI in the last 168 hours. D-Dimer No results for input(s): DDIMER in the last 72 hours. BNP: Invalid input(s): POCBNP CBG: Recent Labs  Lab 12/17/20 1153 12/17/20 1649 12/17/20 2125 12/18/20 0642 12/18/20 1152  GLUCAP 219* 229* 220* 168* 69*   Anemia work up No results for input(s): VITAMINB12, FOLATE, FERRITIN, TIBC, IRON, RETICCTPCT in the last 72 hours. Urinalysis    Component Value Date/Time   COLORURINE AMBER (A) 10/28/2020 1322   APPEARANCEUR TURBID (A) 10/28/2020 1322   LABSPEC 1.014 10/28/2020 1322   PHURINE 5.0 10/28/2020 1322   GLUCOSEU NEGATIVE 10/28/2020 1322   HGBUR MODERATE (A) 10/28/2020 1322   BILIRUBINUR NEGATIVE 10/28/2020 1322   KETONESUR NEGATIVE 10/28/2020 1322   PROTEINUR 100 (A) 10/28/2020 1322   NITRITE NEGATIVE 10/28/2020 1322   LEUKOCYTESUR LARGE (A) 10/28/2020 1322   Sepsis Labs Invalid input(s): PROCALCITONIN,  WBC,  LACTICIDVEN     SIGNED:  Shawna Clamp, MD  [facility] Jan 07, 2021, 12:48 PM Pager   If 7PM-7AM, please contact night-coverage

## 2020-12-28 NOTE — Progress Notes (Signed)
Corrected address on post Mortem for Daughter

## 2020-12-28 NOTE — Progress Notes (Signed)
Patient passed with RN present.

## 2020-12-28 NOTE — Progress Notes (Addendum)
PROGRESS NOTE    Christy Zhang  [id number] DOB: 02-07-1956 DOA: 12/26/2020 PCP: Claretta Fraise, MD   Brief Narrative:  This 89 old female with history of bilateral BKA, ESRD on dialysis,  type 2 diabetes, hypertension, peripheral vascular disease, hyperlipidemia, recently discharged from [facility] on 11/14/2020 after prolonged hospitalization when she was admitted with altered mental status and had to be intubated for airway protection and also underwent CRRT in the setting of septic shock from pneumonia causing acute respiratory failure with hypoxia, AKI.  She presented at [facility] hospital ED because it was too painful to sit down for dialysis due to her decubitus ulcer and she also missed 2 session of dialysis.  Patient was sent to [facility] from [facility] for further management.  Nephrology,orthopedics, palliative care,  vascular surgery consulted.  Patient has downward decline, remains poorly responsive and hypotensive.  Family has decided DNR / DNI, no aggressive intervention.  Patient is made comfort care, anticipated hospital death.   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 Patient presented with nonhealing wounds on bilateral BKA stumps:  Vascular surgery following, status post bilateral AKA on 11/29/2020. WBCs 43.9 on 12/10/2020.  Antibiotics discontinued on 12/07/2020 after discussion with vascular surgery.  Hospitalist has spoke with Dr. Grandville Silos [facility] [place] [facility] on phone on 12/07/2020 who recommended to continue monitoring for  now and if hand becomes more painful or changes into wet gangrene, patient will need surgical intervention. Hospitalist has spoken to the daughter on phone on 12/07/2020 who was very concerned about her mother's hand because of decreased mobility and increasing pain. Dr. Grandville Silos spoke to the patient's daughter on 12/08/2020 on phone.  Currently no plans for hand surgery.   Sacral and right hip decubitus ulcers: Present on admission Currently undergoing hydrotherapy as per general surgery recommendations (did not recommend debridement).  Continue Santyl Wound care management discontinued .  Patient is now made comfort care.   End-stage renal disease:  Continued on dialysis as per nephrology schedule. Hyperkalemia: Resolved. She is unable to sit for dialysis in the recliner with her physical limitations.  She has needed LTAC for continued dialysis/wound care/physical therapy. Patient remains hypotensive, not a candidate for pressors which may compromise perfusion to other extremities. Family requested no dialysis.  Hemodialysis discontinued.  Anemia of chronic disease: Hb dropped again to 6.6>6.8 > s/p PRBC 8.7>8.5 monitor H&H,  keep hemoglobin above 7. Hemoglobin dropped to 6.8, has received 2 units PRBCs. No more blood transfusions.   Thrombocytosis Possibly reactive. Continue to monitor.  Mild hyponatremia Comfort care.   Obesity-outpatient follow-up   Paroxysmal A. Fib Heart rate controlled, discontinue amiodarone and Eliquis   Left renal mass MRI was attempted x4 but subsequently canceled.   Diabetes mellitus type 2 A1c 6.4 on 10/27/2020.  Hypotension:   On comfort measures.  Generalized deconditioning Goals of care: Overall prognosis is very poor.   Palliative care has evaluated the patient during this hospitalization.  Patient remains full code. After detailed discussion with family about goals of care family has decided DNR/DNI and  no hemodialysis. Patient is comfort  care.  Family declined hospice.  Anticipated hospital death.  DVT prophylaxis: None Code Status: DNR Family Communication: Spoke with daughters. Disposition Plan:   Status is: Inpatient  Remains inpatient appropriate because:Inpatient level of care appropriate due to severity of illness  Dispo:  Patient From:  HOME  Planned Disposition: Comfort measures /anticipated hospital death  Medically stable for discharge: Yes     Consultants:  Nephro Vascular surgery  Procedures:  Antimicrobials:  Anti-infectives (From admission, onward)    Start     Dose/Rate Route Frequency Ordered Stop   12/08/20 1200  vancomycin (VANCOCIN) IVPB 1000 mg/200 mL premix  Status:  Discontinued        1,000 mg 200 mL/hr over 60 Minutes Intravenous Every M-W-F (Hemodialysis) 12/06/20 1138 12/07/20 1005   11/30/2020 0600  vancomycin (VANCOCIN) IVPB 1000 mg/200 mL premix        1,000 mg 200 mL/hr over 60 Minutes Intravenous To Surgery 12/04/20 1557 12/08/2020 1038   12/04/20 1200  vancomycin (VANCOCIN) IVPB 1000 mg/200 mL premix  Status:  Discontinued        1,000 mg 200 mL/hr over 60 Minutes Intravenous Every M-W-F (Hemodialysis) 12/02/20 1734 12/06/20 1138   12/03/20 1800  ceFEPIme (MAXIPIME) 1 g in sodium chloride 0.9 % 100 mL IVPB  Status:  Discontinued        1 g 200 mL/hr over 30 Minutes Intravenous Every 24 hours 12/02/20 1734 12/07/20 1005   12/02/20 1830  vancomycin (VANCOREADY) IVPB 1750 mg/350 mL        1,750 mg 175 mL/hr over 120 Minutes Intravenous  Once 12/02/20 1734 12/02/20 2156   12/02/20 1800  ceFEPIme (MAXIPIME) 2 g in sodium chloride 0.9 % 100 mL IVPB        2 g 200 mL/hr over 30 Minutes Intravenous  Once 12/02/20 1707 12/02/20 2003   12/02/20 1800  metroNIDAZOLE (FLAGYL) IVPB 500 mg  Status:  Discontinued        500 mg 100 mL/hr over 60 Minutes Intravenous Every 8 hours 12/02/20 1707 12/07/20 1005   12/02/20 1800  vancomycin (VANCOCIN) IVPB 1000 mg/200 mL premix  Status:   Discontinued        1,000 mg 200 mL/hr over 60 Minutes Intravenous  Once 12/02/20 1707 12/02/20 1734        Subjective: Patient was seen at bedside.  Overnight events noted. Patient is barely responsive, remains comatose. Patient has bilateral AKA,  left hand significantly gangrenous but appears dry. Patient is comfort care.   Objective: Vitals:   12/18/20 0154 12/18/20 0527 12/18/20 0902 12/18/20 2056  BP: (!) 96/45 (!) 104/46 (!) 91/46 (!) 91/40  Pulse: 83 81 93 85  Resp: '16 18 18 16  '$ Temp: 98.1 F (36.7 C) 98 F (36.7 C) 99 F (37.2 C) 99 F (37.2 C)  TempSrc:      SpO2: 100% 100% 100% 100%  Weight:      Height:        Intake/Output Summary (Last 24 hours) at 01-08-21 1246 Last data filed at 01/08/21 0600 Gross per 24 hour  Intake 0 ml  Output --  Net 0 ml   Filed Weights   12/13/20 0533 12/14/20 1414 12/14/20 1830  Weight: 80.7 kg 83.2 kg 82 kg    Examination:  General exam: Appears chronically ill, deconditioned, not in any distress. Respiratory system: Decreased breath sounds. Cardiovascular system: S1-S2 heard, regular rate and rhythm, no murmur Gastrointestinal system: Abdomen is soft,  nontender, nondistended, BS positive  Central nervous system: Barely responsive, remains lethargic. Extremities: Bilateral AKA, staples noted at the site, no signs of erythema. Skin: Left hand significantly gangrenous, able to move fingers, appears dry, no change. Multiple unstageable sacral decubitus ulcers in the back. Psychiatry: Flat affect.    Data Reviewed: I have personally reviewed following labs and imaging studies  CBC: Recent Labs  Lab 12/12/20 1317 12/13/20 0524 12/13/20 1616 12/14/20 0350 12/14/20 0700 12/14/20 1150 12/14/20 2241 12/15/20 0708 12/16/20 1921  WBC 40.4* 40.1*  --  41.5*  --   --   --  44.2* 39.5*  HGB 7.0* 6.3*   < > 6.6* 6.8* 6.6* 8.7* 8.5* 6.3*  HCT 22.3* 20.8*   < > 21.0* 21.8* 21.0* 26.7* 26.2* 19.4*  MCV 83.8 84.2  --   84.7  --   --   --  85.6 87.0  PLT 588* 651*  --  620*  --   --   --  562* 519*   < > = values in this interval not displayed.   Basic Metabolic Panel: Recent Labs  Lab 12/13/20 0524 12/14/20 0350 12/15/20 0708 12/16/20 1921  NA 134* 130* 130* 130*  K 4.7 5.1 4.4 5.4*  CL 97* 94* 94* 97*  CO2 26 20* 21* 18*  GLUCOSE 209* 225* 214* 246*  BUN 69* 99* 51* 99*  CREATININE 5.44* 6.28* 4.09* 6.12*  CALCIUM 8.7* 8.7* 8.4* 8.2*  MG  --  2.3  --   --   PHOS  --  5.2*  --  3.3   GFR: Estimated Creatinine Clearance: 9.7 mL/min (A) (by C-G formula based on SCr of 6.12 mg/dL (H)). Liver Function Tests: Recent Labs  Lab 12/16/20 1921  ALBUMIN 1.3*   No results for input(s): LIPASE, AMYLASE in the last 168 hours. No results for input(s): AMMONIA in the last 168 hours. Coagulation Profile: No results for input(s): INR, PROTIME in the last 168 hours. Cardiac Enzymes: No results for input(s): CKTOTAL, CKMB, CKMBINDEX, TROPONINI in the last 168 hours. BNP (last 3 results) No results for input(s): PROBNP in the last 8760 hours. HbA1C: No results for input(s): HGBA1C in the last 72 hours. CBG: Recent Labs  Lab 12/17/20 1153 12/17/20 1649 12/17/20 2125 12/18/20 0642 12/18/20 1152  GLUCAP 219* 229* 220* 168* 69*   Lipid Profile: No results for input(s): CHOL, HDL, LDLCALC, TRIG, CHOLHDL, LDLDIRECT in the last 72 hours. Thyroid Function Tests: No results for input(s): TSH, T4TOTAL, FREET4, T3FREE, THYROIDAB in the last 72 hours. Anemia Panel: No results for input(s): VITAMINB12, FOLATE, FERRITIN, TIBC, IRON, RETICCTPCT in the last 72 hours. Sepsis Labs: No results for input(s): PROCALCITON, LATICACIDVEN in the last 168 hours.  No results found for this or any previous visit (from the past 240 hour(s)).   Radiology Studies: No results found.  Scheduled Meds:  Chlorhexidine Gluconate Cloth  6 each Topical Q0600   doxercalciferol  1.5 mcg Intravenous Q T,Th,Sa-HD   sodium  chloride flush  3 mL Intravenous Q12H   Continuous Infusions:  sodium chloride 10 mL/hr at 12/04/20 0300   sodium chloride       LOS: 22 days    Time spent: 25 mins  Luiza Carranco, MD [facility]   If 7PM-7AM, please contact night-coverage

## 2020-12-28 DEATH — deceased

## 2021-01-24 ENCOUNTER — Telehealth: Payer: Medicare Other

## 2022-03-29 IMAGING — CT CT HEAD W/O CM
3 series · 16 of 47 positions shown, 19 images · non-contrast
Comparison: None.

CLINICAL DATA: Altered mental status.

EXAM:
CT HEAD WITHOUT CONTRAST
TECHNIQUE: Contiguous axial images were obtained from the base of the skull
through the vertex without intravenous contrast.

[Series 2: head w o · axial · 0.42mm/px · z∈[+15,+140]mm · 10 of 31 slices shown, 13 images]
[im 3/31  brain]
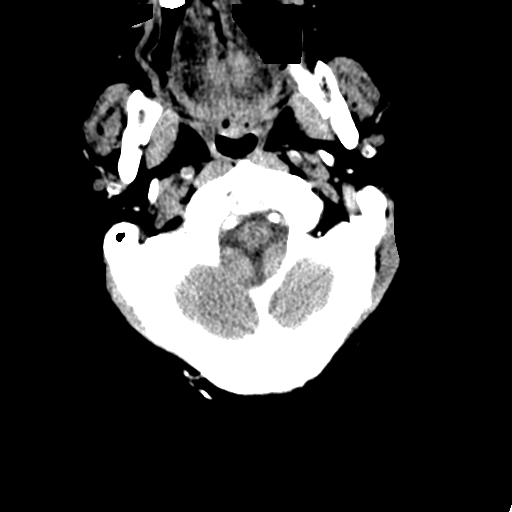
[im 3/31  bone]
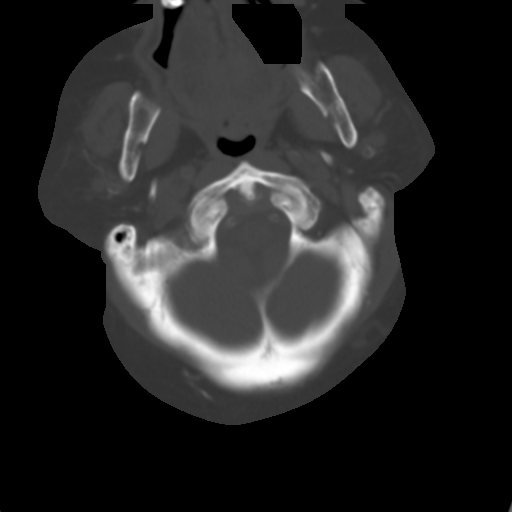
[im 6/31  brain]
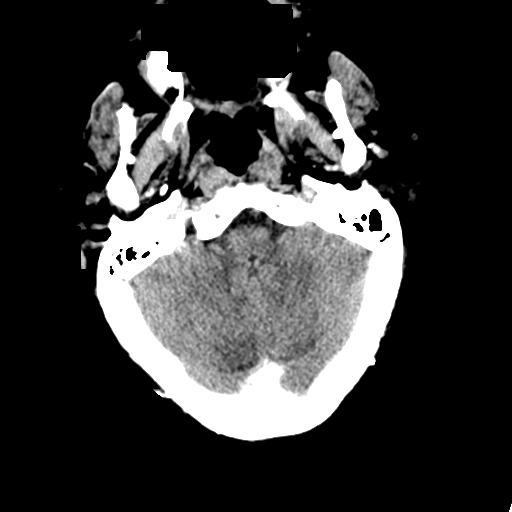
[im 9/31  brain]
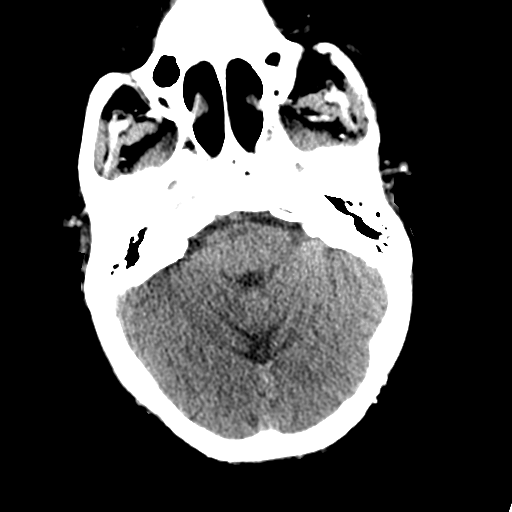
[im 11/31  brain]
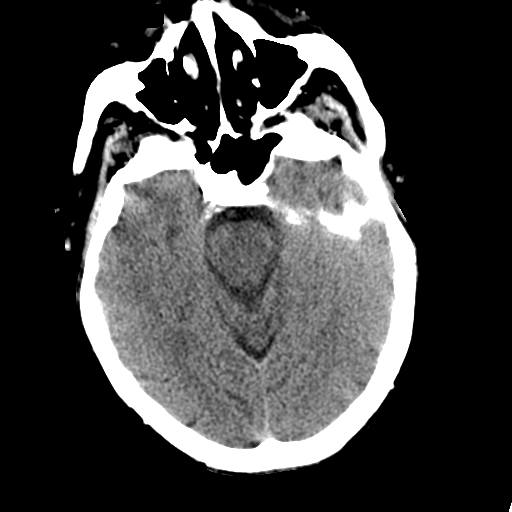
[im 14/31  brain]
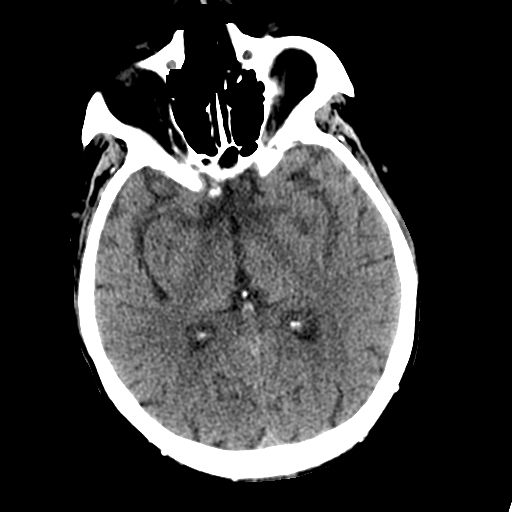
[im 14/31  bone]
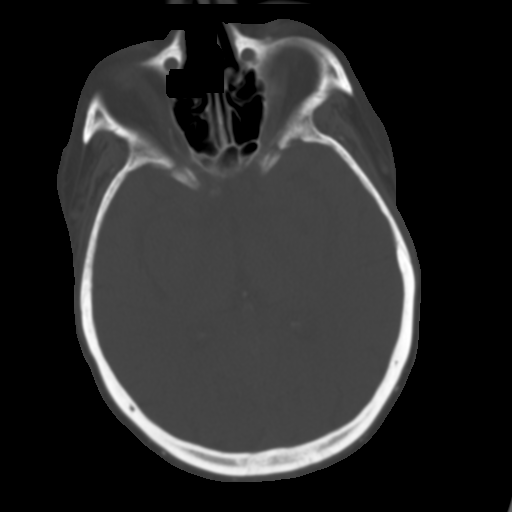
[im 17/31  brain]
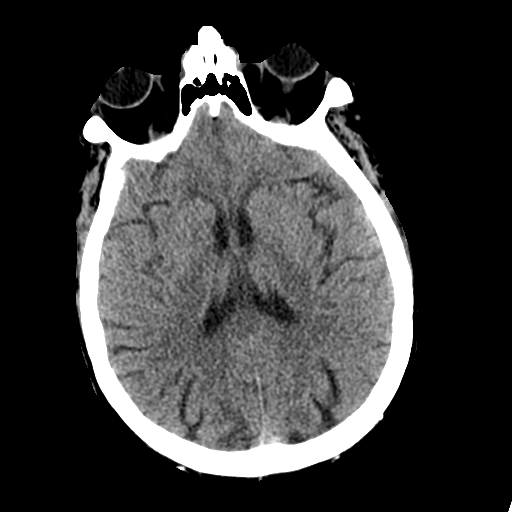
[im 20/31  brain]
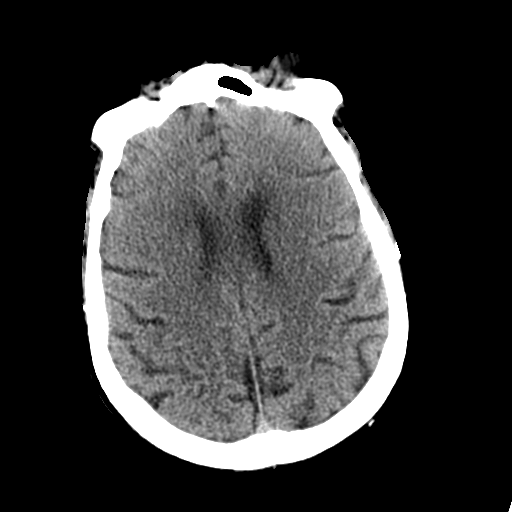
[im 23/31  brain]
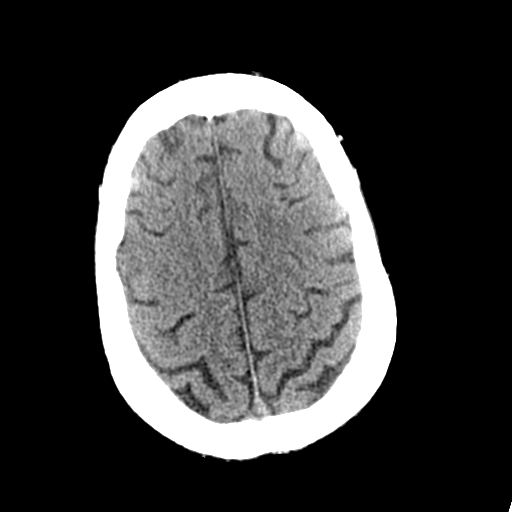
[im 25/31  brain]
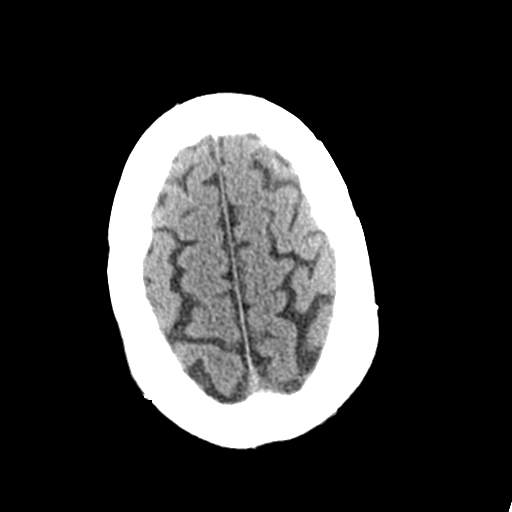
[im 25/31  bone]
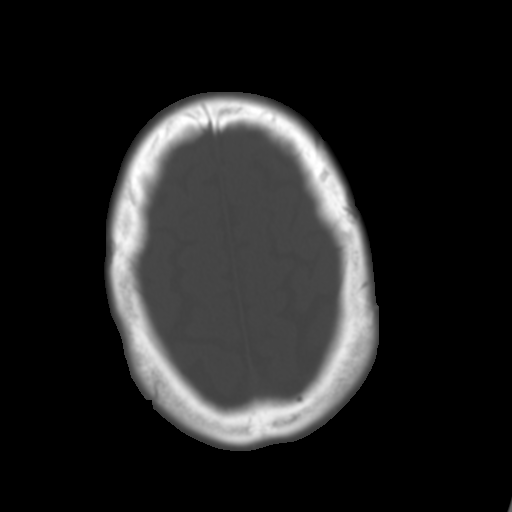
[im 28/31  brain]
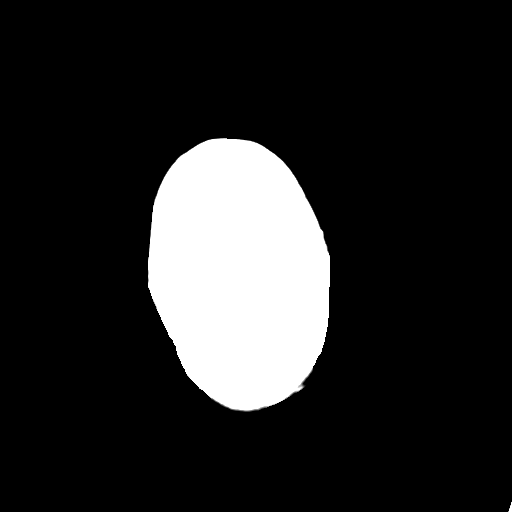

[Series 4: coronal soft · coronal · 0.30mm/px · 3 of 67 slices shown]
[im 23/67  brain]
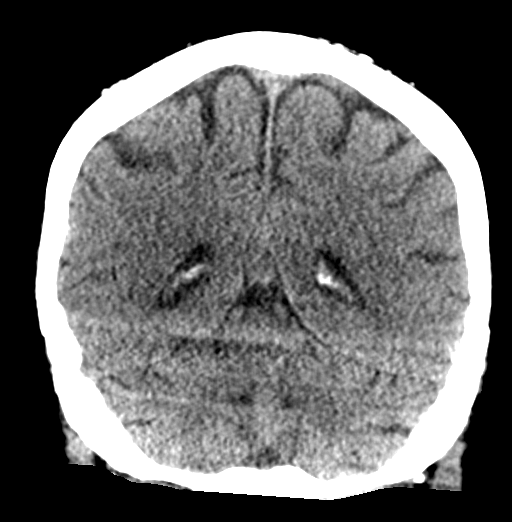
[im 30/67  brain]
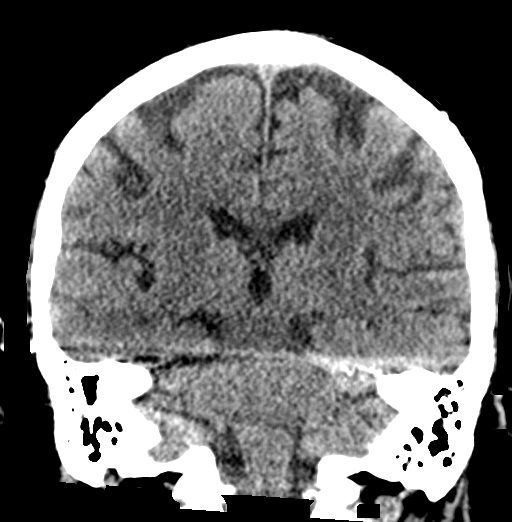
[im 37/67  brain]
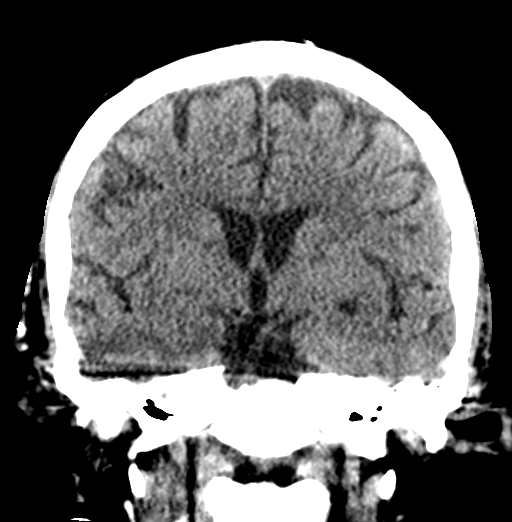

[Series 5: sagittal soft · sagittal · 0.32mm/px · 3 of 51 slices shown]
[im 17/51  brain]
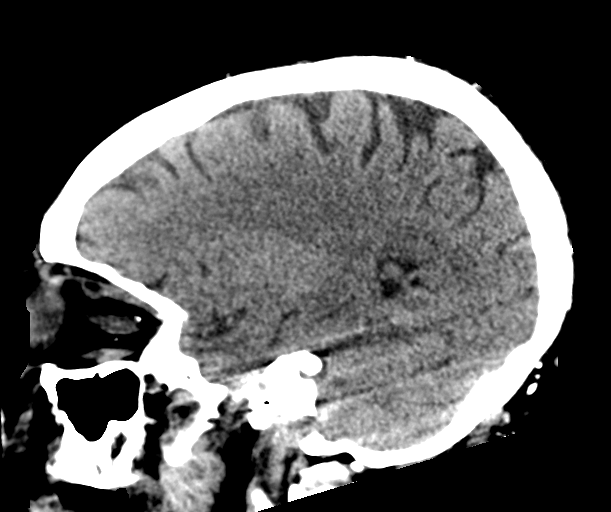
[im 26/51  brain]
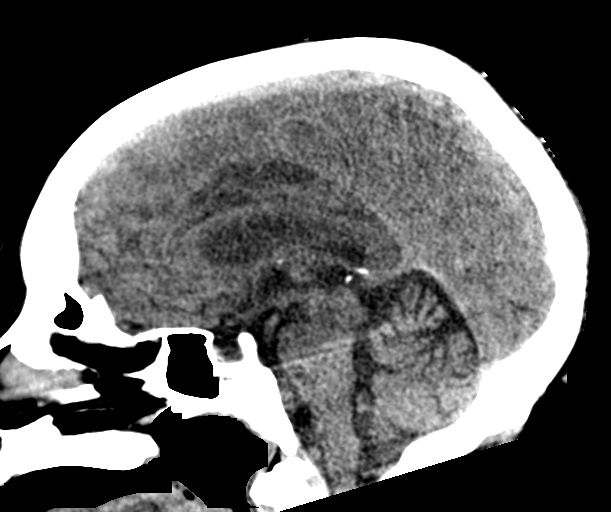
[im 34/51  brain]
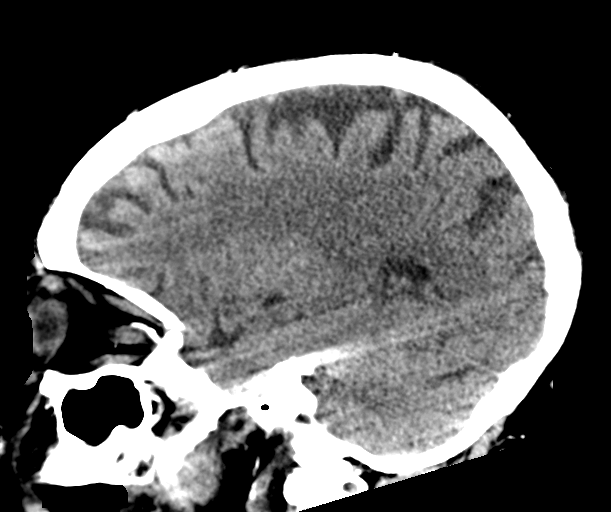

[16 of 47 positions shown; findings below may reference images not displayed]

FINDINGS: Brain: There is mild cerebral atrophy with widening of the
extra-axial spaces and ventricular dilatation.
There are areas of decreased attenuation within the white matter
tracts of the supratentorial brain, consistent with microvascular
disease changes.

Vascular: No hyperdense vessel or unexpected calcification.

Skull: Normal. Negative for fracture or focal lesion.

Sinuses/Orbits: No acute finding.

Other: None.
IMPRESSION: 1. Generalized cerebral atrophy.
2. No acute intracranial abnormality.

## 2022-03-29 IMAGING — DX DG CHEST 1V
1 series · 1 of 1 positions shown · non-contrast
Comparison: 11/04/2020.  11/03/2020.  CT 11/02/2020.

CLINICAL DATA: Follow-up pneumonia.

EXAM:
CHEST  1 VIEW

[chest ap]
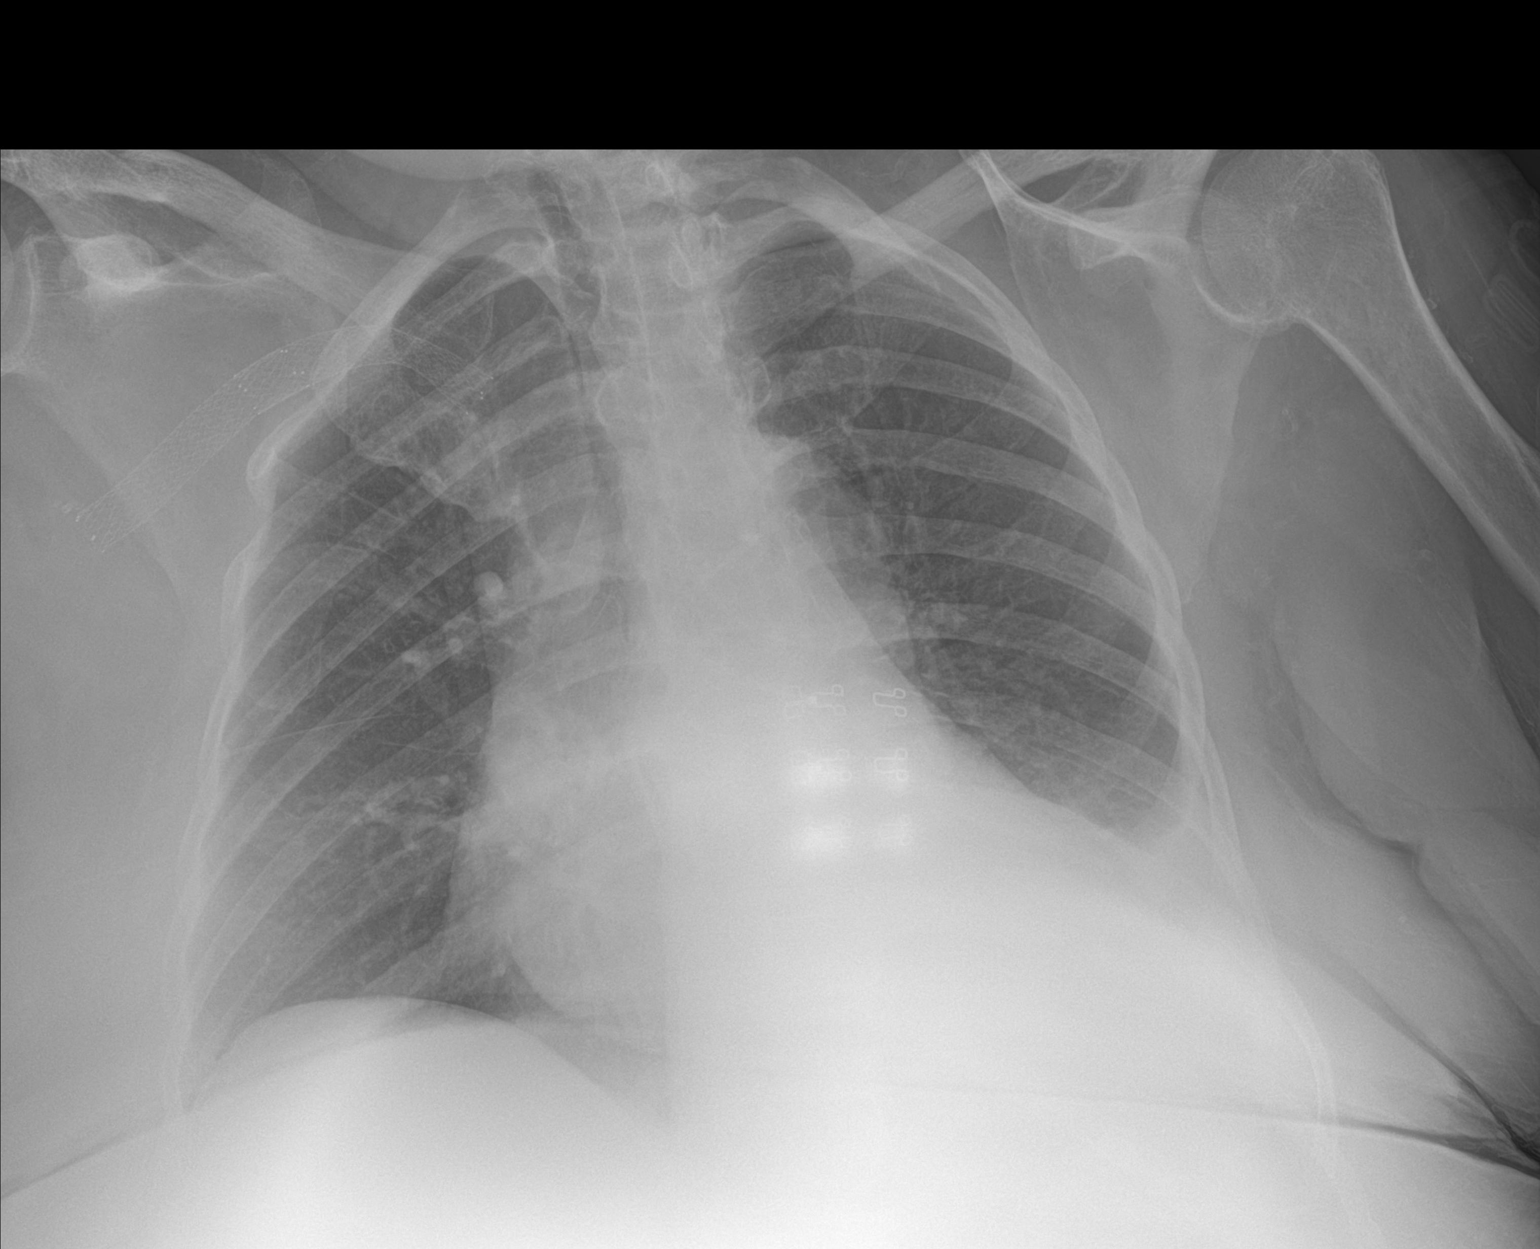

[1 of 1 positions shown; findings below may reference images not displayed]

FINDINGS: Interim extubation and removal of left IJ line and NG tube. Left
base and infiltrate. Small left pleural effusion. No pneumothorax.
Vascular stents noted over the right subclavian region in stable
position.
IMPRESSION: 1.  Interim extubation removal of left IJ line and NG tube.

2. Left base atelectasis and infiltrate. Small left pleural
effusion. These findings are new from prior study of 11/04/2020.

## 2022-03-29 IMAGING — DX DG CHEST 1V PORT
1 series · 1 of 1 positions shown · non-contrast
Comparison: Radiograph earlier today.

CLINICAL DATA: Altered mental status.

EXAM:
PORTABLE CHEST 1 VIEW

[chest ap]
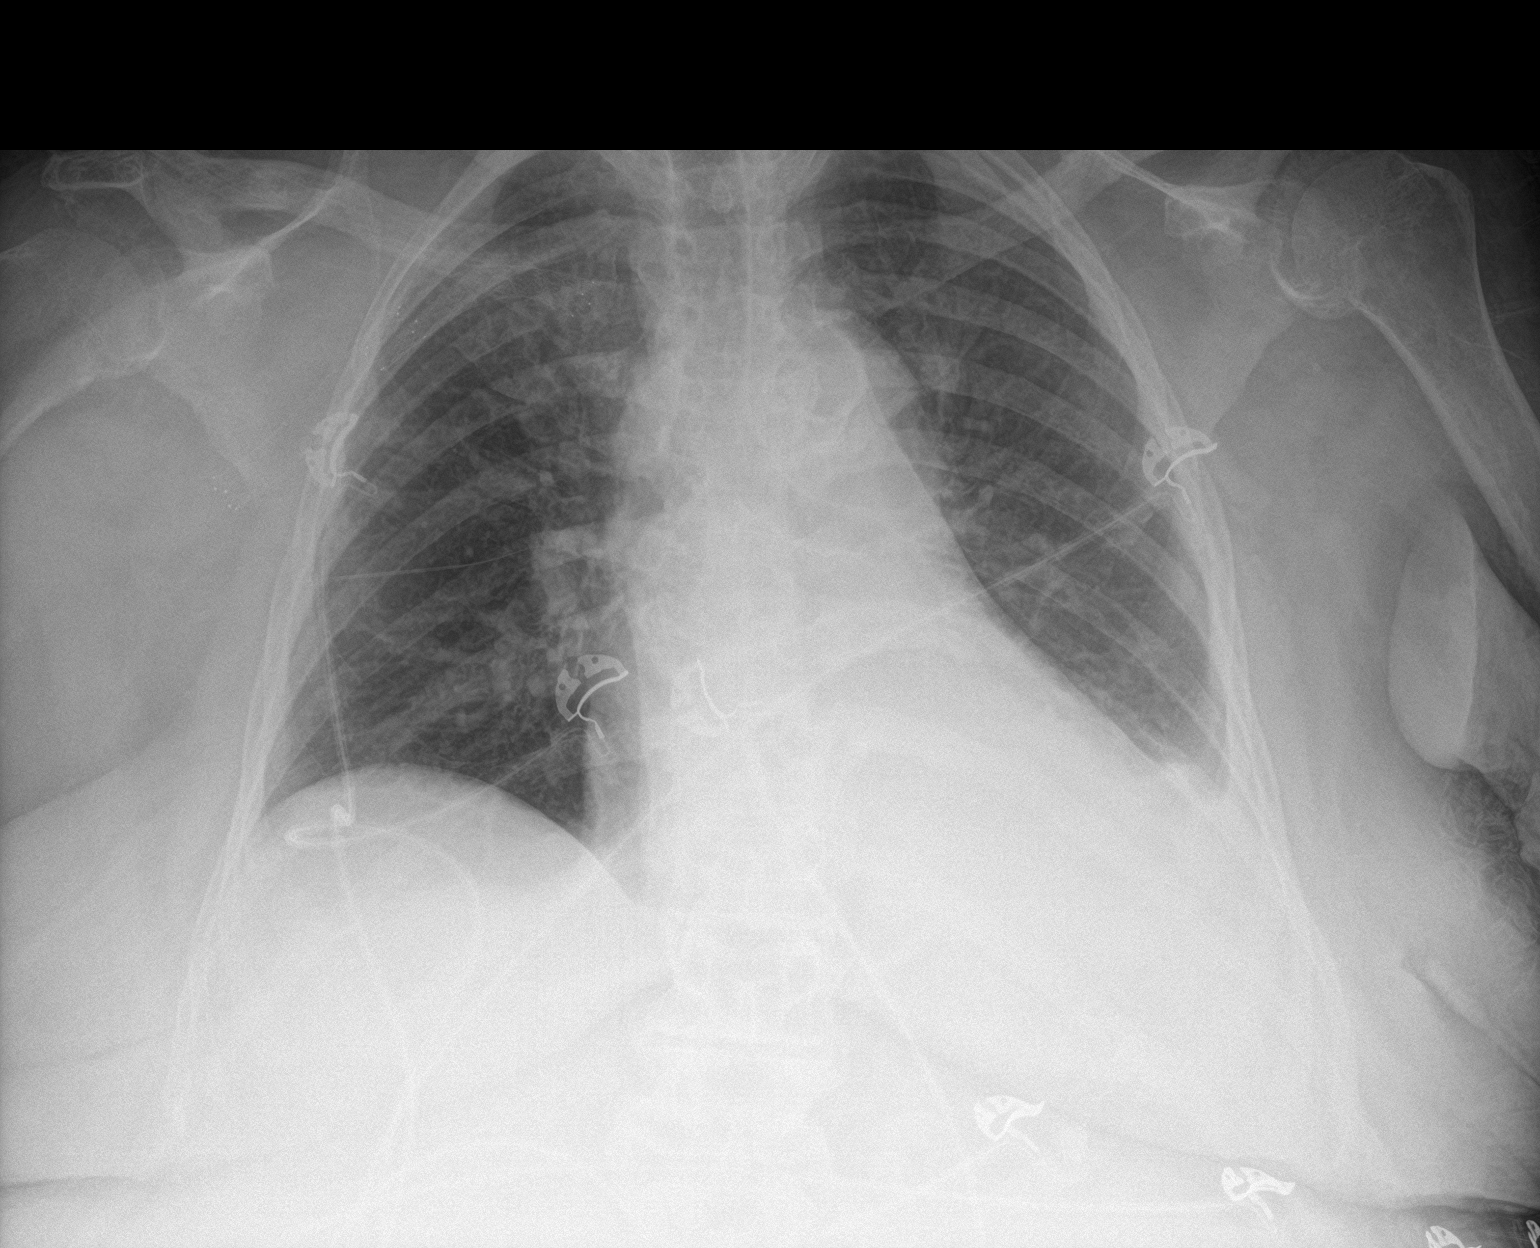

[1 of 1 positions shown; findings below may reference images not displayed]

FINDINGS: Lower lung volumes from earlier today. Stable left basilar opacity
likely combination of pleural fluid and atelectasis/consolidation.
Mild cardiomegaly with unchanged mediastinal contours. Aortic
atherosclerosis. Vascular congestion. No pneumothorax. Right
subclavian vascular stent.
IMPRESSION: Lower lung volumes from earlier today. Stable left basilar opacity
likely combination of pleural fluid and atelectasis/consolidation.

## 2022-04-02 IMAGING — DX DG CHEST 1V PORT
1 series · 1 of 1 positions shown · non-contrast
Comparison: Multiple priors, most recent chest x-ray dated [DATE],

CLINICAL DATA: Generalized weakness.

EXAM:
PORTABLE CHEST 1 VIEW

[chest ap]
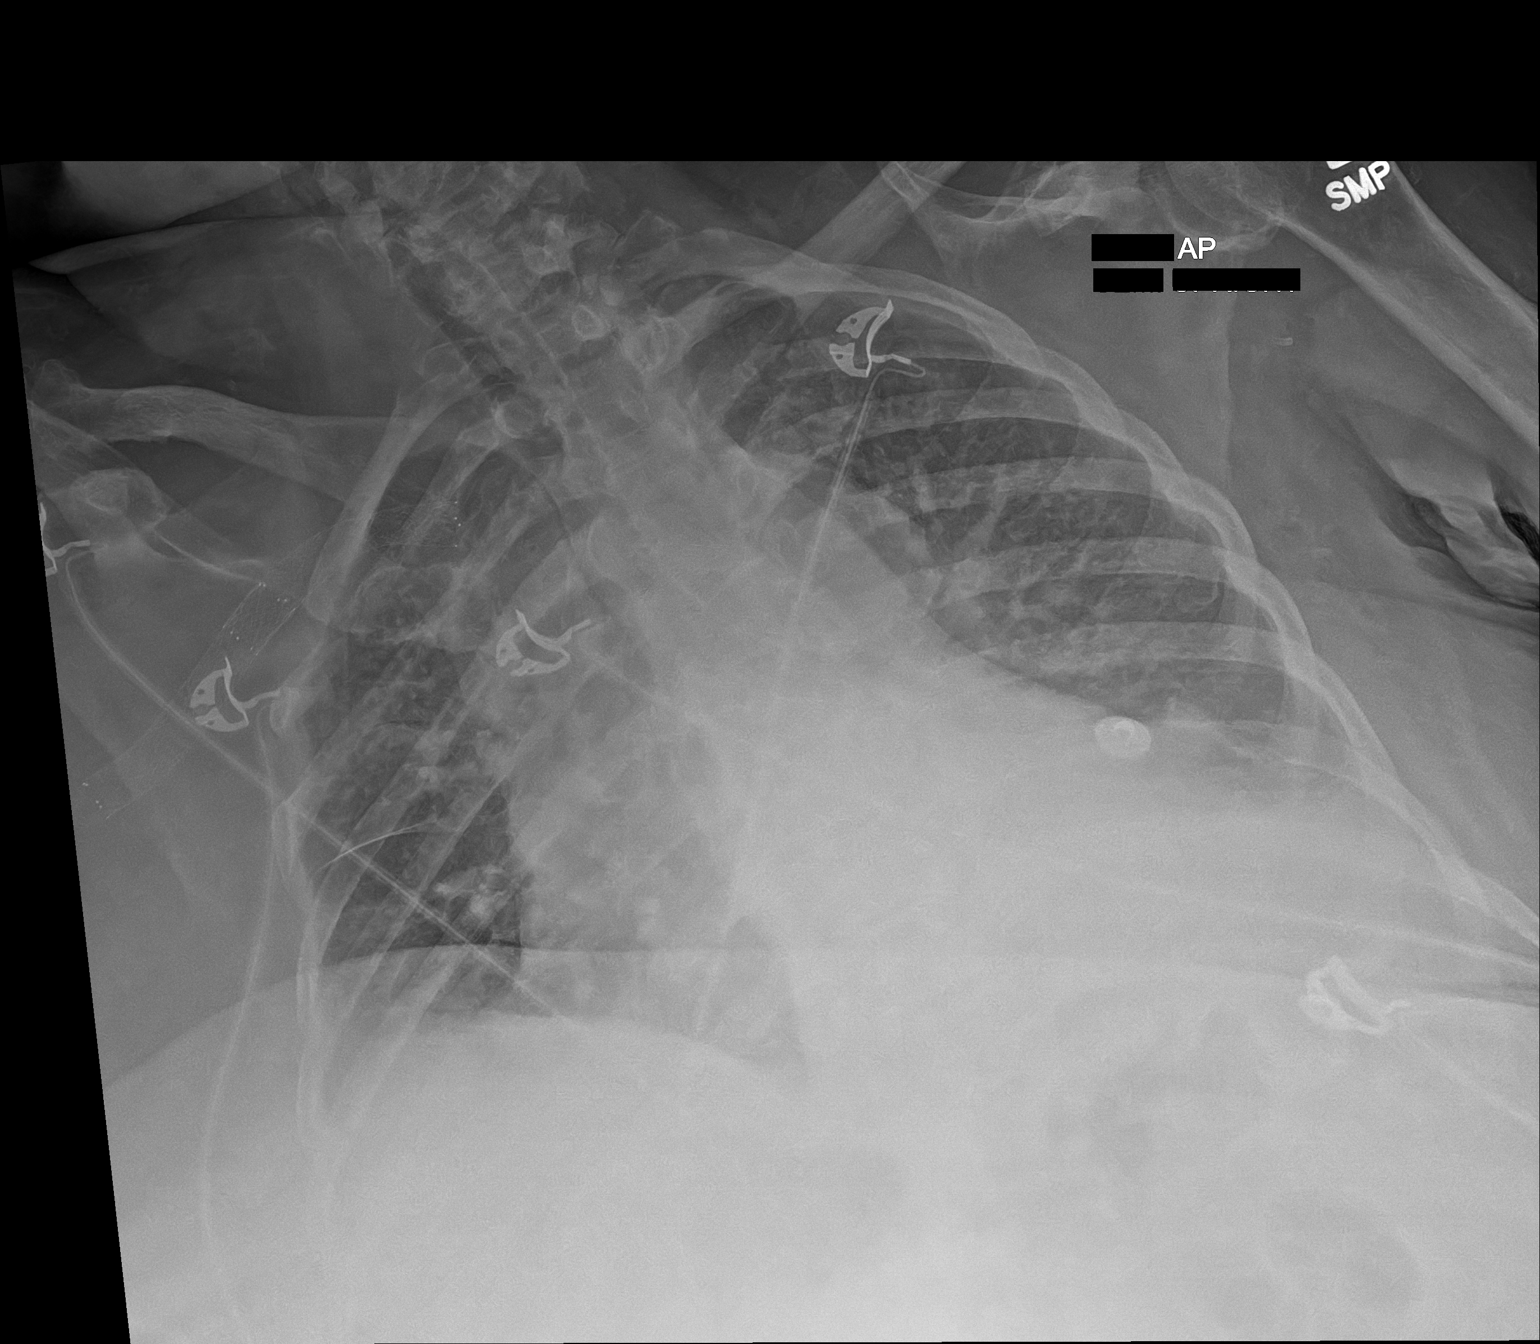

[1 of 1 positions shown; findings below may reference images not displayed]

FINDINGS: Cardiac and mediastinal contours are unchanged. Mild bibasilar
opacities, likely due to atelectasis. No focal consolidation. No
large pleural effusion or evidence of pneumothorax. Vascular stent
projecting over the right axilla.

Left IJ line, ET tube and enteric tube have been removed when
compared with prior x-ray.
IMPRESSION: Mild bibasilar opacities, likely due to atelectasis.
